# Patient Record
Sex: Female | Born: 1935 | Race: White | Hispanic: No | Smoking: Never smoker
Health system: Southern US, Community
[De-identification: ages and names within clinical notes are randomized; demographics above are authoritative.]

## PROBLEM LIST (undated history)

## (undated) DIAGNOSIS — E1140 Type 2 diabetes mellitus with diabetic neuropathy, unspecified: Secondary | ICD-10-CM

## (undated) DIAGNOSIS — K589 Irritable bowel syndrome without diarrhea: Secondary | ICD-10-CM

## (undated) DIAGNOSIS — E785 Hyperlipidemia, unspecified: Secondary | ICD-10-CM

## (undated) DIAGNOSIS — G4700 Insomnia, unspecified: Secondary | ICD-10-CM

## (undated) DIAGNOSIS — N183 Chronic kidney disease, stage 3 unspecified: Secondary | ICD-10-CM

## (undated) DIAGNOSIS — I480 Paroxysmal atrial fibrillation: Secondary | ICD-10-CM

## (undated) DIAGNOSIS — F419 Anxiety disorder, unspecified: Secondary | ICD-10-CM

## (undated) DIAGNOSIS — R42 Dizziness and giddiness: Secondary | ICD-10-CM

## (undated) DIAGNOSIS — T4145XA Adverse effect of unspecified anesthetic, initial encounter: Secondary | ICD-10-CM

## (undated) DIAGNOSIS — E1142 Type 2 diabetes mellitus with diabetic polyneuropathy: Secondary | ICD-10-CM

## (undated) DIAGNOSIS — Z794 Long term (current) use of insulin: Secondary | ICD-10-CM

## (undated) DIAGNOSIS — G629 Polyneuropathy, unspecified: Secondary | ICD-10-CM

## (undated) DIAGNOSIS — R0789 Other chest pain: Secondary | ICD-10-CM

## (undated) DIAGNOSIS — Z992 Dependence on renal dialysis: Secondary | ICD-10-CM

## (undated) DIAGNOSIS — J189 Pneumonia, unspecified organism: Secondary | ICD-10-CM

## (undated) DIAGNOSIS — R001 Bradycardia, unspecified: Secondary | ICD-10-CM

## (undated) DIAGNOSIS — Z9981 Dependence on supplemental oxygen: Secondary | ICD-10-CM

## (undated) DIAGNOSIS — R945 Abnormal results of liver function studies: Secondary | ICD-10-CM

## (undated) DIAGNOSIS — M1990 Unspecified osteoarthritis, unspecified site: Secondary | ICD-10-CM

## (undated) DIAGNOSIS — M109 Gout, unspecified: Secondary | ICD-10-CM

## (undated) DIAGNOSIS — R609 Edema, unspecified: Secondary | ICD-10-CM

## (undated) DIAGNOSIS — I700 Atherosclerosis of aorta: Secondary | ICD-10-CM

## (undated) DIAGNOSIS — H269 Unspecified cataract: Secondary | ICD-10-CM

## (undated) DIAGNOSIS — I509 Heart failure, unspecified: Secondary | ICD-10-CM

## (undated) DIAGNOSIS — I953 Hypotension of hemodialysis: Secondary | ICD-10-CM

## (undated) DIAGNOSIS — K2270 Barrett's esophagus without dysplasia: Secondary | ICD-10-CM

## (undated) DIAGNOSIS — K2990 Gastroduodenitis, unspecified, without bleeding: Secondary | ICD-10-CM

## (undated) DIAGNOSIS — C50919 Malignant neoplasm of unspecified site of unspecified female breast: Secondary | ICD-10-CM

## (undated) DIAGNOSIS — I4891 Unspecified atrial fibrillation: Secondary | ICD-10-CM

## (undated) DIAGNOSIS — Z9889 Other specified postprocedural states: Secondary | ICD-10-CM

## (undated) DIAGNOSIS — R109 Unspecified abdominal pain: Secondary | ICD-10-CM

## (undated) DIAGNOSIS — K219 Gastro-esophageal reflux disease without esophagitis: Secondary | ICD-10-CM

## (undated) DIAGNOSIS — R112 Nausea with vomiting, unspecified: Secondary | ICD-10-CM

## (undated) DIAGNOSIS — I739 Peripheral vascular disease, unspecified: Secondary | ICD-10-CM

## (undated) DIAGNOSIS — R7989 Other specified abnormal findings of blood chemistry: Secondary | ICD-10-CM

## (undated) DIAGNOSIS — N186 End stage renal disease: Secondary | ICD-10-CM

## (undated) DIAGNOSIS — K2970 Gastritis, unspecified, without bleeding: Secondary | ICD-10-CM

## (undated) DIAGNOSIS — B001 Herpesviral vesicular dermatitis: Secondary | ICD-10-CM

## (undated) DIAGNOSIS — N281 Cyst of kidney, acquired: Secondary | ICD-10-CM

## (undated) DIAGNOSIS — I10 Essential (primary) hypertension: Secondary | ICD-10-CM

## (undated) DIAGNOSIS — D638 Anemia in other chronic diseases classified elsewhere: Secondary | ICD-10-CM

## (undated) DIAGNOSIS — N289 Disorder of kidney and ureter, unspecified: Secondary | ICD-10-CM

## (undated) DIAGNOSIS — H8110 Benign paroxysmal vertigo, unspecified ear: Secondary | ICD-10-CM

## (undated) DIAGNOSIS — D509 Iron deficiency anemia, unspecified: Secondary | ICD-10-CM

## (undated) DIAGNOSIS — I150 Renovascular hypertension: Secondary | ICD-10-CM

## (undated) DIAGNOSIS — I499 Cardiac arrhythmia, unspecified: Secondary | ICD-10-CM

## (undated) DIAGNOSIS — Z79899 Other long term (current) drug therapy: Secondary | ICD-10-CM

## (undated) DIAGNOSIS — Z8489 Family history of other specified conditions: Secondary | ICD-10-CM

## (undated) DIAGNOSIS — T8859XA Other complications of anesthesia, initial encounter: Secondary | ICD-10-CM

## (undated) HISTORY — DX: Polyneuropathy, unspecified: G62.9

## (undated) HISTORY — DX: Other chest pain: R07.89

## (undated) HISTORY — DX: Gastroduodenitis, unspecified, without bleeding: K29.90

## (undated) HISTORY — PX: BACK SURGERY: SHX140

## (undated) HISTORY — DX: Gout, unspecified: M10.9

## (undated) HISTORY — PX: HEMORRHOID SURGERY: SHX153

## (undated) HISTORY — DX: Gastritis, unspecified, without bleeding: K29.70

## (undated) HISTORY — DX: Chronic kidney disease, stage 3 (moderate): N18.3

## (undated) HISTORY — DX: Type 2 diabetes mellitus with diabetic neuropathy, unspecified: E11.40

## (undated) HISTORY — DX: Gastro-esophageal reflux disease without esophagitis: K21.9

## (undated) HISTORY — DX: Unspecified atrial fibrillation: I48.91

## (undated) HISTORY — DX: Benign paroxysmal vertigo, unspecified ear: H81.10

## (undated) HISTORY — DX: Peripheral vascular disease, unspecified: I73.9

## (undated) HISTORY — DX: Chronic kidney disease, stage 3 unspecified: N18.30

## (undated) HISTORY — DX: Dizziness and giddiness: R42

## (undated) HISTORY — DX: Anxiety disorder, unspecified: F41.9

## (undated) HISTORY — DX: Morbid (severe) obesity due to excess calories: E66.01

## (undated) HISTORY — PX: MASTECTOMY: SHX3

## (undated) HISTORY — DX: Hyperlipidemia, unspecified: E78.5

## (undated) HISTORY — DX: Paroxysmal atrial fibrillation: I48.0

## (undated) HISTORY — DX: Anemia in other chronic diseases classified elsewhere: D63.8

## (undated) HISTORY — DX: Barrett's esophagus without dysplasia: K22.70

## (undated) HISTORY — DX: Edema, unspecified: R60.9

## (undated) HISTORY — PX: EYE SURGERY: SHX253

## (undated) HISTORY — DX: Long term (current) use of insulin: Z79.4

## (undated) HISTORY — DX: Renovascular hypertension: I15.0

## (undated) HISTORY — DX: Essential (primary) hypertension: I10

## (undated) HISTORY — PX: CHOLECYSTECTOMY: SHX55

## (undated) HISTORY — DX: Cyst of kidney, acquired: N28.1

## (undated) HISTORY — DX: Disorder of kidney and ureter, unspecified: N28.9

## (undated) HISTORY — DX: Irritable bowel syndrome, unspecified: K58.9

## (undated) HISTORY — DX: Insomnia, unspecified: G47.00

## (undated) HISTORY — DX: Atherosclerosis of aorta: I70.0

## (undated) HISTORY — DX: Unspecified cataract: H26.9

## (undated) HISTORY — PX: CATARACT EXTRACTION: SUR2

## (undated) HISTORY — DX: Hypotension of hemodialysis: I95.3

## (undated) HISTORY — DX: Herpesviral vesicular dermatitis: B00.1

## (undated) HISTORY — DX: Unspecified osteoarthritis, unspecified site: M19.90

## (undated) HISTORY — DX: Heart failure, unspecified: I50.9

---

## 2000-04-07 ENCOUNTER — Encounter: Payer: Self-pay | Admitting: Family Medicine

## 2000-04-07 ENCOUNTER — Encounter: Admission: RE | Admit: 2000-04-07 | Discharge: 2000-04-07 | Payer: Self-pay | Admitting: Family Medicine

## 2001-12-02 ENCOUNTER — Encounter: Payer: Self-pay | Admitting: Specialist

## 2001-12-02 ENCOUNTER — Emergency Department (HOSPITAL_COMMUNITY): Admission: EM | Admit: 2001-12-02 | Discharge: 2001-12-02 | Payer: Self-pay | Admitting: Emergency Medicine

## 2001-12-02 ENCOUNTER — Encounter: Payer: Self-pay | Admitting: Emergency Medicine

## 2001-12-07 ENCOUNTER — Encounter: Payer: Self-pay | Admitting: Orthopedic Surgery

## 2001-12-08 ENCOUNTER — Inpatient Hospital Stay (HOSPITAL_COMMUNITY): Admission: RE | Admit: 2001-12-08 | Discharge: 2001-12-09 | Payer: Self-pay | Admitting: Orthopedic Surgery

## 2004-02-03 ENCOUNTER — Inpatient Hospital Stay (HOSPITAL_COMMUNITY): Admission: EM | Admit: 2004-02-03 | Discharge: 2004-02-10 | Payer: Self-pay

## 2004-04-17 ENCOUNTER — Other Ambulatory Visit: Admission: RE | Admit: 2004-04-17 | Discharge: 2004-04-17 | Payer: Self-pay | Admitting: Oncology

## 2004-05-30 ENCOUNTER — Ambulatory Visit: Admission: RE | Admit: 2004-05-30 | Discharge: 2004-05-30 | Payer: Self-pay | Admitting: Family Medicine

## 2004-05-30 ENCOUNTER — Other Ambulatory Visit: Admission: RE | Admit: 2004-05-30 | Discharge: 2004-05-30 | Payer: Self-pay | Admitting: Oncology

## 2005-04-11 ENCOUNTER — Ambulatory Visit: Payer: Self-pay | Admitting: Oncology

## 2005-05-22 ENCOUNTER — Ambulatory Visit: Payer: Self-pay | Admitting: Internal Medicine

## 2005-06-02 ENCOUNTER — Ambulatory Visit: Payer: Self-pay | Admitting: Internal Medicine

## 2005-06-02 ENCOUNTER — Ambulatory Visit (HOSPITAL_COMMUNITY): Admission: RE | Admit: 2005-06-02 | Discharge: 2005-06-02 | Payer: Self-pay | Admitting: Internal Medicine

## 2005-10-24 ENCOUNTER — Ambulatory Visit: Payer: Self-pay | Admitting: Oncology

## 2005-10-30 DIAGNOSIS — N289 Disorder of kidney and ureter, unspecified: Secondary | ICD-10-CM

## 2005-10-30 DIAGNOSIS — I739 Peripheral vascular disease, unspecified: Secondary | ICD-10-CM

## 2005-10-30 HISTORY — DX: Disorder of kidney and ureter, unspecified: N28.9

## 2005-10-30 HISTORY — DX: Peripheral vascular disease, unspecified: I73.9

## 2005-12-22 HISTORY — PX: ORIF TIBIA & FIBULA FRACTURES: SHX2131

## 2006-01-08 ENCOUNTER — Ambulatory Visit: Payer: Self-pay | Admitting: Oncology

## 2006-01-26 ENCOUNTER — Other Ambulatory Visit: Admission: RE | Admit: 2006-01-26 | Discharge: 2006-01-26 | Payer: Self-pay | Admitting: Obstetrics and Gynecology

## 2006-03-31 ENCOUNTER — Encounter: Admission: RE | Admit: 2006-03-31 | Discharge: 2006-03-31 | Payer: Self-pay | Admitting: Specialist

## 2006-06-28 ENCOUNTER — Inpatient Hospital Stay (HOSPITAL_COMMUNITY): Admission: EM | Admit: 2006-06-28 | Discharge: 2006-07-02 | Payer: Self-pay | Admitting: Emergency Medicine

## 2006-07-17 ENCOUNTER — Ambulatory Visit: Payer: Self-pay | Admitting: Oncology

## 2006-07-17 LAB — CBC WITH DIFFERENTIAL/PLATELET
Basophils Absolute: 0 10*3/uL (ref 0.0–0.1)
Eosinophils Absolute: 0.1 10*3/uL (ref 0.0–0.5)
HCT: 29.8 % — ABNORMAL LOW (ref 34.8–46.6)
HGB: 9.9 g/dL — ABNORMAL LOW (ref 11.6–15.9)
LYMPH%: 17.4 % (ref 14.0–48.0)
MCV: 95.2 fL (ref 81.0–101.0)
MONO#: 1.1 10*3/uL — ABNORMAL HIGH (ref 0.1–0.9)
MONO%: 7.3 % (ref 0.0–13.0)
NEUT#: 10.8 10*3/uL — ABNORMAL HIGH (ref 1.5–6.5)
Platelets: 448 10*3/uL — ABNORMAL HIGH (ref 145–400)
WBC: 14.6 10*3/uL — ABNORMAL HIGH (ref 3.9–10.0)

## 2006-07-17 LAB — MORPHOLOGY

## 2006-07-17 LAB — CHCC SMEAR

## 2006-07-20 LAB — ERYTHROPOIETIN: Erythropoietin: 7.5 m[IU]/mL (ref 2.6–34.0)

## 2006-07-20 LAB — COMPREHENSIVE METABOLIC PANEL
ALT: 54 U/L — ABNORMAL HIGH (ref 0–40)
CO2: 22 mEq/L (ref 19–32)
Calcium: 9.2 mg/dL (ref 8.4–10.5)
Chloride: 108 mEq/L (ref 96–112)
Potassium: 4.9 mEq/L (ref 3.5–5.3)
Sodium: 141 mEq/L (ref 135–145)
Total Protein: 6.6 g/dL (ref 6.0–8.3)

## 2006-08-07 LAB — CBC WITH DIFFERENTIAL/PLATELET
BASO%: 1.3 % (ref 0.0–2.0)
Basophils Absolute: 0.1 10*3/uL (ref 0.0–0.1)
EOS%: 2.2 % (ref 0.0–7.0)
HGB: 12 g/dL (ref 11.6–15.9)
MCH: 31.1 pg (ref 26.0–34.0)
NEUT#: 2.6 10*3/uL (ref 1.5–6.5)
RBC: 3.84 10*6/uL (ref 3.70–5.32)
RDW: 15.7 % — ABNORMAL HIGH (ref 11.3–14.5)
lymph#: 2.3 10*3/uL (ref 0.9–3.3)

## 2006-08-17 ENCOUNTER — Encounter: Admission: RE | Admit: 2006-08-17 | Discharge: 2006-08-17 | Payer: Self-pay | Admitting: Specialist

## 2006-08-18 ENCOUNTER — Inpatient Hospital Stay (HOSPITAL_COMMUNITY): Admission: RE | Admit: 2006-08-18 | Discharge: 2006-08-27 | Payer: Self-pay | Admitting: Specialist

## 2006-08-31 ENCOUNTER — Ambulatory Visit: Payer: Self-pay | Admitting: Oncology

## 2006-08-31 LAB — CBC WITH DIFFERENTIAL/PLATELET
Basophils Absolute: 0 10*3/uL (ref 0.0–0.1)
Eosinophils Absolute: 0.2 10*3/uL (ref 0.0–0.5)
HGB: 10.3 g/dL — ABNORMAL LOW (ref 11.6–15.9)
MCV: 93.6 fL (ref 81.0–101.0)
MONO#: 0.7 10*3/uL (ref 0.1–0.9)
NEUT#: 4 10*3/uL (ref 1.5–6.5)
RDW: 15.1 % — ABNORMAL HIGH (ref 11.3–14.5)
WBC: 8 10*3/uL (ref 3.9–10.0)
lymph#: 3 10*3/uL (ref 0.9–3.3)

## 2006-10-14 ENCOUNTER — Ambulatory Visit: Payer: Self-pay | Admitting: Oncology

## 2006-12-10 ENCOUNTER — Ambulatory Visit: Payer: Self-pay | Admitting: Oncology

## 2006-12-17 LAB — MORPHOLOGY: PLT EST: ADEQUATE

## 2006-12-17 LAB — CBC WITH DIFFERENTIAL/PLATELET
BASO%: 0.5 % (ref 0.0–2.0)
HCT: 31.6 % — ABNORMAL LOW (ref 34.8–46.6)
MCHC: 33.6 g/dL (ref 32.0–36.0)
MONO#: 0.7 10*3/uL (ref 0.1–0.9)
RBC: 3.28 10*6/uL — ABNORMAL LOW (ref 3.70–5.32)
WBC: 10.7 10*3/uL — ABNORMAL HIGH (ref 3.9–10.0)
lymph#: 4 10*3/uL — ABNORMAL HIGH (ref 0.9–3.3)

## 2006-12-17 LAB — CHCC SMEAR

## 2006-12-18 LAB — IRON AND TIBC
%SAT: 28 % (ref 20–55)
TIBC: 394 ug/dL (ref 250–470)

## 2006-12-18 LAB — COMPREHENSIVE METABOLIC PANEL
ALT: 89 U/L — ABNORMAL HIGH (ref 0–35)
AST: 96 U/L — ABNORMAL HIGH (ref 0–37)
Creatinine, Ser: 2.53 mg/dL — ABNORMAL HIGH (ref 0.40–1.20)
Total Bilirubin: 0.6 mg/dL (ref 0.3–1.2)

## 2006-12-18 LAB — PROTEIN ELECTROPHORESIS, SERUM
Albumin ELP: 46.7 % — ABNORMAL LOW (ref 55.8–66.1)
Alpha-1-Globulin: 5 % — ABNORMAL HIGH (ref 2.9–4.9)
Gamma Globulin: 24.2 % — ABNORMAL HIGH (ref 11.1–18.8)

## 2006-12-18 LAB — FERRITIN: Ferritin: 289 ng/mL (ref 10–291)

## 2007-01-06 LAB — CBC WITH DIFFERENTIAL/PLATELET
Basophils Absolute: 0 10*3/uL (ref 0.0–0.1)
Eosinophils Absolute: 0.1 10*3/uL (ref 0.0–0.5)
HGB: 12.7 g/dL (ref 11.6–15.9)
LYMPH%: 36.5 % (ref 14.0–48.0)
MCV: 101.5 fL — ABNORMAL HIGH (ref 81.0–101.0)
MONO#: 0.5 10*3/uL (ref 0.1–0.9)
MONO%: 7.3 % (ref 0.0–13.0)
NEUT#: 3.8 10*3/uL (ref 1.5–6.5)
Platelets: 227 10*3/uL (ref 145–400)
RDW: 18.3 % — ABNORMAL HIGH (ref 11.3–14.5)

## 2007-01-25 ENCOUNTER — Ambulatory Visit: Payer: Self-pay | Admitting: Oncology

## 2007-01-28 LAB — CBC WITH DIFFERENTIAL/PLATELET
Eosinophils Absolute: 0.1 10*3/uL (ref 0.0–0.5)
HCT: 37.8 % (ref 34.8–46.6)
LYMPH%: 27 % (ref 14.0–48.0)
MCV: 98.4 fL (ref 81.0–101.0)
MONO%: 9.2 % (ref 0.0–13.0)
NEUT#: 5.3 10*3/uL (ref 1.5–6.5)
NEUT%: 62.3 % (ref 39.6–76.8)
Platelets: 204 10*3/uL (ref 145–400)
RBC: 3.85 10*6/uL (ref 3.70–5.32)

## 2007-02-18 LAB — CBC WITH DIFFERENTIAL/PLATELET
BASO%: 0.6 % (ref 0.0–2.0)
EOS%: 1.8 % (ref 0.0–7.0)
HCT: 37.6 % (ref 34.8–46.6)
LYMPH%: 31.8 % (ref 14.0–48.0)
MCH: 32.8 pg (ref 26.0–34.0)
MCHC: 33.7 g/dL (ref 32.0–36.0)
MCV: 97.2 fL (ref 81.0–101.0)
MONO#: 0.6 10*3/uL (ref 0.1–0.9)
MONO%: 8.2 % (ref 0.0–13.0)
NEUT%: 57.6 % (ref 39.6–76.8)
Platelets: 178 10*3/uL (ref 145–400)
RBC: 3.87 10*6/uL (ref 3.70–5.32)
WBC: 7.6 10*3/uL (ref 3.9–10.0)

## 2007-03-09 ENCOUNTER — Ambulatory Visit: Payer: Self-pay | Admitting: Oncology

## 2007-03-11 LAB — CBC WITH DIFFERENTIAL/PLATELET
BASO%: 0.6 % (ref 0.0–2.0)
EOS%: 2 % (ref 0.0–7.0)
MCH: 33.6 pg (ref 26.0–34.0)
MCHC: 35 g/dL (ref 32.0–36.0)
MONO#: 0.7 10*3/uL (ref 0.1–0.9)
NEUT%: 53.6 % (ref 39.6–76.8)
RBC: 3.85 10*6/uL (ref 3.70–5.32)
RDW: 15.5 % — ABNORMAL HIGH (ref 11.3–14.5)
WBC: 8.8 10*3/uL (ref 3.9–10.0)
lymph#: 3.2 10*3/uL (ref 0.9–3.3)

## 2007-06-07 ENCOUNTER — Ambulatory Visit: Payer: Self-pay | Admitting: Oncology

## 2007-06-10 LAB — CBC WITH DIFFERENTIAL/PLATELET
Basophils Absolute: 0.1 10*3/uL (ref 0.0–0.1)
Eosinophils Absolute: 0.2 10*3/uL (ref 0.0–0.5)
HGB: 11.4 g/dL — ABNORMAL LOW (ref 11.6–15.9)
LYMPH%: 34.2 % (ref 14.0–48.0)
MCV: 100.7 fL (ref 81.0–101.0)
MONO%: 9.1 % (ref 0.0–13.0)
NEUT#: 5.3 10*3/uL (ref 1.5–6.5)
Platelets: 171 10*3/uL (ref 145–400)
RDW: 15.5 % — ABNORMAL HIGH (ref 11.3–14.5)

## 2007-06-10 LAB — MORPHOLOGY
PLT EST: ADEQUATE
Platelet Morphology: NORMAL

## 2007-06-10 LAB — COMPREHENSIVE METABOLIC PANEL
ALT: 68 U/L — ABNORMAL HIGH (ref 0–35)
AST: 107 U/L — ABNORMAL HIGH (ref 0–37)
Alkaline Phosphatase: 90 U/L (ref 39–117)
Calcium: 8.8 mg/dL (ref 8.4–10.5)
Chloride: 101 mEq/L (ref 96–112)
Creatinine, Ser: 2.3 mg/dL — ABNORMAL HIGH (ref 0.40–1.20)

## 2007-06-10 LAB — CHCC SMEAR

## 2007-07-14 LAB — CBC WITH DIFFERENTIAL/PLATELET
EOS%: 2.1 % (ref 0.0–7.0)
Eosinophils Absolute: 0.2 10*3/uL (ref 0.0–0.5)
LYMPH%: 44 % (ref 14.0–48.0)
MCH: 34.2 pg — ABNORMAL HIGH (ref 26.0–34.0)
MCV: 99.6 fL (ref 81.0–101.0)
MONO%: 7.8 % (ref 0.0–13.0)
NEUT#: 5.1 10*3/uL (ref 1.5–6.5)
Platelets: 179 10*3/uL (ref 145–400)
RBC: 3.67 10*6/uL — ABNORMAL LOW (ref 3.70–5.32)

## 2007-08-07 ENCOUNTER — Ambulatory Visit: Payer: Self-pay | Admitting: Oncology

## 2007-08-11 LAB — CBC WITH DIFFERENTIAL/PLATELET
BASO%: 0.4 % (ref 0.0–2.0)
LYMPH%: 36.8 % (ref 14.0–48.0)
MCHC: 34.7 g/dL (ref 32.0–36.0)
MCV: 98.3 fL (ref 81.0–101.0)
MONO#: 0.8 10*3/uL (ref 0.1–0.9)
MONO%: 9 % (ref 0.0–13.0)
Platelets: 198 10*3/uL (ref 145–400)
RBC: 3.69 10*6/uL — ABNORMAL LOW (ref 3.70–5.32)
WBC: 9.2 10*3/uL (ref 3.9–10.0)

## 2007-09-08 LAB — CBC WITH DIFFERENTIAL/PLATELET
BASO%: 0.5 % (ref 0.0–2.0)
MCHC: 34.6 g/dL (ref 32.0–36.0)
MONO#: 0.8 10*3/uL (ref 0.1–0.9)
RBC: 3.69 10*6/uL — ABNORMAL LOW (ref 3.70–5.32)
WBC: 9.4 10*3/uL (ref 3.9–10.0)
lymph#: 3.4 10*3/uL — ABNORMAL HIGH (ref 0.9–3.3)

## 2007-10-04 ENCOUNTER — Ambulatory Visit: Payer: Self-pay | Admitting: Oncology

## 2007-10-06 LAB — CBC WITH DIFFERENTIAL/PLATELET
BASO%: 0.5 % (ref 0.0–2.0)
Basophils Absolute: 0.1 10*3/uL (ref 0.0–0.1)
HCT: 34.6 % — ABNORMAL LOW (ref 34.8–46.6)
HGB: 12 g/dL (ref 11.6–15.9)
MONO#: 0.7 10*3/uL (ref 0.1–0.9)
NEUT%: 54.4 % (ref 39.6–76.8)
WBC: 11.1 10*3/uL — ABNORMAL HIGH (ref 3.9–10.0)
lymph#: 4.1 10*3/uL — ABNORMAL HIGH (ref 0.9–3.3)

## 2007-11-03 LAB — CBC WITH DIFFERENTIAL/PLATELET
Basophils Absolute: 0 10*3/uL (ref 0.0–0.1)
EOS%: 1.4 % (ref 0.0–7.0)
Eosinophils Absolute: 0.2 10*3/uL (ref 0.0–0.5)
HCT: 41.2 % (ref 34.8–46.6)
HGB: 14.1 g/dL (ref 11.6–15.9)
MCH: 33.4 pg (ref 26.0–34.0)
MCV: 97.8 fL (ref 81.0–101.0)
NEUT%: 50.2 % (ref 39.6–76.8)
lymph#: 4.8 10*3/uL — ABNORMAL HIGH (ref 0.9–3.3)

## 2007-11-29 ENCOUNTER — Ambulatory Visit: Payer: Self-pay | Admitting: Oncology

## 2007-11-30 LAB — CBC WITH DIFFERENTIAL/PLATELET
Eosinophils Absolute: 0.3 10*3/uL (ref 0.0–0.5)
HCT: 38.9 % (ref 34.8–46.6)
LYMPH%: 30.9 % (ref 14.0–48.0)
MCHC: 34.4 g/dL (ref 32.0–36.0)
MCV: 95.9 fL (ref 81.0–101.0)
MONO#: 0.8 10*3/uL (ref 0.1–0.9)
MONO%: 6.6 % (ref 0.0–13.0)
NEUT#: 7.4 10*3/uL — ABNORMAL HIGH (ref 1.5–6.5)
NEUT%: 59.4 % (ref 39.6–76.8)
Platelets: 196 10*3/uL (ref 145–400)
RBC: 4.06 10*6/uL (ref 3.70–5.32)
WBC: 12.5 10*3/uL — ABNORMAL HIGH (ref 3.9–10.0)

## 2007-11-30 LAB — MORPHOLOGY

## 2007-12-02 LAB — COMPREHENSIVE METABOLIC PANEL
ALT: 78 U/L — ABNORMAL HIGH (ref 0–35)
AST: 104 U/L — ABNORMAL HIGH (ref 0–37)
Albumin: 3.5 g/dL (ref 3.5–5.2)
Alkaline Phosphatase: 97 U/L (ref 39–117)
Calcium: 8.9 mg/dL (ref 8.4–10.5)
Chloride: 106 mEq/L (ref 96–112)
Potassium: 4.1 mEq/L (ref 3.5–5.3)
Sodium: 141 mEq/L (ref 135–145)
Total Protein: 8 g/dL (ref 6.0–8.3)

## 2007-12-02 LAB — IRON AND TIBC
%SAT: 32 % (ref 20–55)
TIBC: 370 ug/dL (ref 250–470)

## 2007-12-02 LAB — KAPPA/LAMBDA LIGHT CHAINS: Kappa:Lambda Ratio: 0.88 (ref 0.26–1.65)

## 2008-02-08 ENCOUNTER — Ambulatory Visit: Payer: Self-pay | Admitting: Oncology

## 2008-02-10 LAB — CBC & DIFF AND RETIC
BASO%: 0.6 % (ref 0.0–2.0)
EOS%: 1.6 % (ref 0.0–7.0)
HCT: 36.1 % (ref 34.8–46.6)
IRF: 0.36 — ABNORMAL HIGH (ref 0.130–0.330)
MCH: 33.3 pg (ref 26.0–34.0)
MCHC: 34.4 g/dL (ref 32.0–36.0)
NEUT%: 55.4 % (ref 39.6–76.8)
RBC: 3.74 10*6/uL (ref 3.70–5.32)
Retic %: 2 % (ref 0.4–2.3)
WBC: 12.4 10*3/uL — ABNORMAL HIGH (ref 3.9–10.0)
lymph#: 4.2 10*3/uL — ABNORMAL HIGH (ref 0.9–3.3)

## 2008-02-10 LAB — CHCC SMEAR

## 2008-02-10 LAB — MORPHOLOGY: PLT EST: ADEQUATE

## 2008-05-05 ENCOUNTER — Ambulatory Visit: Payer: Self-pay | Admitting: Oncology

## 2008-09-04 ENCOUNTER — Ambulatory Visit: Payer: Self-pay | Admitting: Oncology

## 2008-09-06 LAB — COMPREHENSIVE METABOLIC PANEL
ALT: 22 U/L (ref 0–35)
AST: 27 U/L (ref 0–37)
Albumin: 3.7 g/dL (ref 3.5–5.2)
Alkaline Phosphatase: 108 U/L (ref 39–117)
Potassium: 3.6 mEq/L (ref 3.5–5.3)
Sodium: 138 mEq/L (ref 135–145)
Total Bilirubin: 0.4 mg/dL (ref 0.3–1.2)
Total Protein: 7.3 g/dL (ref 6.0–8.3)

## 2008-09-06 LAB — CBC & DIFF AND RETIC
Eosinophils Absolute: 0.2 10*3/uL (ref 0.0–0.5)
HCT: 38.3 % (ref 34.8–46.6)
LYMPH%: 30.6 % (ref 14.0–48.0)
MCHC: 33.9 g/dL (ref 32.0–36.0)
MCV: 95 fL (ref 81.0–101.0)
MONO#: 0.7 10*3/uL (ref 0.1–0.9)
MONO%: 5.2 % (ref 0.0–13.0)
NEUT#: 8.6 10*3/uL — ABNORMAL HIGH (ref 1.5–6.5)
NEUT%: 62.6 % (ref 39.6–76.8)
Platelets: 188 10*3/uL (ref 145–400)
RBC: 4.03 10*6/uL (ref 3.70–5.32)
Retic %: 2.1 % (ref 0.4–2.3)
WBC: 13.8 10*3/uL — ABNORMAL HIGH (ref 3.9–10.0)

## 2008-09-06 LAB — MORPHOLOGY
PLT EST: ADEQUATE
RBC Comments: NORMAL

## 2008-09-06 LAB — IRON AND TIBC
%SAT: 22 % (ref 20–55)
Iron: 78 ug/dL (ref 42–145)

## 2008-09-06 LAB — CHCC SMEAR

## 2008-12-05 DIAGNOSIS — R0789 Other chest pain: Secondary | ICD-10-CM

## 2008-12-05 HISTORY — DX: Other chest pain: R07.89

## 2011-01-11 ENCOUNTER — Encounter: Payer: Self-pay | Admitting: Family Medicine

## 2011-01-12 ENCOUNTER — Encounter: Payer: Self-pay | Admitting: Family Medicine

## 2011-05-09 NOTE — Discharge Summary (Signed)
[name] James, [name] James               ACCOUNT NO.:  [id number]   MEDICAL RECORD NO.:  [id number]          PATIENT TYPE:  INP   LOCATION:  2019                         FACILITY:  [facility]   PHYSICIAN:  Jessy Oto, M.D.   DATE OF BIRTH:  1936/12/13   DATE OF ADMISSION:  08/18/2006  DATE OF DISCHARGE:                                 DISCHARGE SUMMARY   ADMISSION DIAGNOSES:  1. Left six week old depressed medial tibial plateau fracture with entry      into the lateral compartment.  2. Status post nondisplaced left lateral malleolus fracture treated with      casting.  3. Chronic renal insufficiency.  4. Type 2 diabetes mellitus.  5. Hypertension.  6. Hypercholesterolemia.  7. History of anemia.   DISCHARGE DIAGNOSES:  1. Left six week old depressed medial tibial plateau fracture with entry      into the lateral compartment.  2. Status post nondisplaced left lateral malleolus fracture treated with      casting.  3. Chronic renal insufficiency.  4. Type 2 diabetes mellitus.  5. Hypertension.  6. Hypercholesterolemia.  7. History of anemia.  8. Hypokalemia, resolved at discharge.  9. Gastroesophageal reflux disease treated with proton pump inhibitor in      the hospital.  10.Acute on chronic renal insufficiency, resolved at discharge.  11.Transient atrial fibrillation, resolved at discharge.   PROCEDURES:  On August 18, 2006, the patient underwent open reduction  internal fixation of left medial tibial plateau fracture utilizing buttress  plate and screws as well as bone grafting utilizing Allograft cancellous  chip and tricortical Allograft bone graft.  This was performed by Dr. Louanne Skye,  assisted by Phillips Hay, P.A., under general anesthesia.   CONSULTATIONS:  [employer].   BRIEF HISTORY:  [name] James is a 75 year old female with history of type 2  diabetes mellitus controlled with oral agents and diet.  She also has renal  insufficiency and hypertension.  On June 28, 2006, she fell while getting in  her car in a parking lot sustaining a fracture to the right ankle.  This was  treated in the emergency room where x-rays revealed nondisplaced malleolar  malleolus fracture.  She was splinted and eventually was sent to nursing  facility for further care as was unable to care for herself at home.  Per  follow up in the office, she complained of left knee pain.  The radiographs  were obtained.  The x-rays of the left knee showed a bicondylar plateau  fracture with fracture of the medial plateau with depression increasing  varus deformity.  The depression was greater than 4 mm with entry into the  medial aspect of the lateral tibial plateau fracture found on CT scan.  It  was felt she would require surgical intervention and was admitted for the  procedure as stated above.   BRIEF HOSPITAL COURSE:  On the first postoperative day, the patient had  notable low urine output.  Her renal insufficiency did appear to be  worsening and a medical consult was obtained.  The [facility] was  able to see the patient for medical care during the hospital stay.  She  initially was placed on vancomycin postoperatively.  However, this was  discontinued due to her poor renal function.  Her diabetes was managed with  sliding scale insulin.  She was noted to be hypotensive on the first  postoperative day as well, and this was treated with fluid support.  The  patient was placed on Coumadin for DVT prophylaxis.  Adjustments in Coumadin  dose were made according to daily pro times.  Hyperkalemia was noted and  felt to be secondary to renal function.  Medications were adjusted, and the  patient's potassium returned to normal level.  The patient complained of  difficulty with swallowing and reflux-type symptoms.  Protonix was started  and symptoms resolved slowly during the hospital stay.  From an orthopedic  standpoint, physical therapy was initiated on postop day #1.  She was  given  instructions for strict nonweightbearing on the operative extremity due to  her obesity as well as her debilitation.  Secondary to the injury, she had  difficulty tolerating very much activity.  Essentially, physical therapy  involved bed-to-chair transfers.  She was instructed in range of motion and  strengthening exercises of the left knee wearing a Bledsoe brace.  The  Bledsoe brace was applied by [employer].  Dressing changes were performed at  the wound site which was found to be healing well throughout the hospital  stay.  The patient required social service consult to assist with nursing  home placement.  She preoperatively was residing at [facility] in [place].  A bed was reserved for her.  It was felt that she  would require return to this facility for continuation of her rehabilitation  while awaiting medical clearance to return to the nursing facility, she did  develop transient atrial fibrillation.  This was treated with Cardizem.  She  was also noted to have an elevated D. dimer.  A VQ scan was obtained, and  the patient was noted to be negative for pulmonary embolism.  It was not  felt that she would require long-term anticoagulation for her atrial  fibrillation.  Eventually, her renal insufficiency resolved and her diabetes  and hypertension were deemed stable.  However, she did continue to be very  slow with any type of activities.  She was able to scoot-pivot-transfer to  the bedside chair with moderate assist being nonweightbearing on the left  lower extremity.  She did require resting during transfer secondary to  fatigue.  Hemoglobin and hematocrit were normal on admission with values  12.4 and 37.0, respectively.  On August 20, 2006, hemoglobin and hematocrit  were stable at 9.6 and 28.6.  Coagulation studies on August 25, 2006,  showed INR 2.6.  Chemistry studies on admission were BUN 34, creatinine 1.3. Values elevated to BUN 42 and  creatinine 2.0 have been resolved to normal  values of 19 and 1.2 prior to discharge.  Cardiac markers were obtained on  August 23, 2006, after an episode of atrial fibrillation.  The values were  all within normal limits.  TSH was noted to be normal on August 23, 2006,  as well.  BNP on August 29 was 54.  VQ lung scan showed no evidence of  pulmonary embolism.  Chest x-ray on August 28 showed no acute  cardiopulmonary findings and repeat on August 29 showed left base  atelectasis.   PLAN:  The patient is being transferred back to  the facility from which she  came.  She was residing at [facility].  There she  should continue with physical therapy for ambulation and gait training, bed-  to-chair transfers.  She is strict nonweightbearing on the left lower  extremity.  She is quite debilitated and does have some difficulty with  transfers at this time.  The patient has a Bledsoe brace to the left knee  which will be at 90 degrees maximum of flexion.  Range of motion and  strengthening exercises should be performed with the Bledsoe brace applied.  She has been wearing TED stockings during the hospital stay to prevent  edema.  She should continue to wear TED's; however, they should be removed  at least every 8 hours to do skin checks.  The patient should be monitored  closely for skin breakdown as she is quite immobile at this point.  Her  heels and sacrum and elbows should be checked regularly.  This would mean at  least on a b.i.d. basis.  She will be on a low carbohydrate modified diet.  She will continue on sliding scale insulin for her diabetes as well.  Sliding scale should be written by the physician at the nursing facility.  Thus far, she has been only moderate NovoLog subcutaneous insulin protocol  with values ranging 60-100, 0 units; 101-150, 2 units; 151-200, 3 units; 201-  250, 5 units; 251-300, 7 units; 301-350, 9 units; greater than 350, 11   units.   MEDICATIONS:  1. Glucotrol 10 mg p.o. b.i.d.  2. Zocor 20 mg p.o. daily.  3. Ferrous sulfate 325 mg p.o. daily.  4. Multivitamin daily.  5. Tri-Chlor 140 mg tablet p.o. daily.  6. Actos 30 mg daily.  7. Valium 5 mg p.o. b.i.d.  8. Zyloprim 100 mg p.o. b.i.d.  9. Pepcid 20 mg p.o. daily.  10.MiraLax p.o. b.i.d.  11.Atenolol 25 mg p.o. b.i.d.  12.Magic Mouthwash as needed.  13.Avapro 150 mg p.o. daily.  14.P.r.n. medications including:      a.     Percocet 5/325 1-2 q.4-6 h p.r.n. pain.      b.     Restoril 30 mg one q.h.s. p.r.n. sleep.      c.     Robaxin 500 mg one q.8 h p.r.n. spasms.  15.She should remain on Coumadin for DVT prophylaxis.  She is currently on      2 mg daily at 6 p.m.  This should be continued until at least six weeks      postop.  Discontinuation of the medication will depend also upon her      activity level.   DISCHARGE INSTRUCTIONS:  Her dressings of the knee may be removed for showering purposes on a daily basis.  The clean dry dressing may be applied.  The patient should be encouraged in exercises of the lower and upper  extremities, even when physical therapy is not present.  Occupational  therapy consult for ADL's with recommendations named for her ADL's at the  nursing facility.  The patient should follow up with Dr. Louanne Skye two weeks  from the date of her surgery.  Appointment may be made by calling [phone].   CONDITION ON DISCHARGE:  Stable.      Epimenio Foot, P.A.      Jessy Oto, M.D.  Electronically Signed    SMV/MEDQ  D:  08/26/2006  T:  08/26/2006  Job:  [id number]

## 2011-05-09 NOTE — Op Note (Signed)
NAMECANDIACE, [name] James               ACCOUNT NO.:  [id number]   MEDICAL RECORD NO.:  [id number]          PATIENT TYPE:  INP   LOCATION:  5007                         FACILITY:  [facility]   PHYSICIAN:  Jessy Oto, M.D.   DATE OF BIRTH:  05/29/1936   DATE OF PROCEDURE:  08/18/2006  DATE OF DISCHARGE:                                 OPERATIVE REPORT   PREOPERATIVE DIAGNOSIS:  Left six-week-old depressed medial tibial plateau  fracture with entry into the lateral compartment.  Previous nondisplaced  left lateral malleolus fracture.   POSTOPERATIVE DIAGNOSIS:  Left six-week-old depressed medial tibial plateau  fracture with entry into the lateral compartment.  Previous nondisplaced  left lateral malleolus fracture.   PROCEDURE:  Open reduction internal fixation of a left medial tibial plateau  fracture, elevating the medial tibial plateau and bone grafting underneath,  and internal fixation using a 7- hole 3.5 locking buttress plate.  Bone  grafting with allograft cancellus chips as well as tricortical allograft  bone graft material.   SURGEON:  Jessy Oto, M.D.   ASSISTANT:  Phillips Hay, PA-C.   ANESTHESIA:  General via oral tracheal intubation, Dr. Sherren Kerns.   SPECIMENS:  None.   ESTIMATED BLOOD LOSS:  300 mL.   COMPLICATIONS:  None.   TOTAL TOURNIQUET TIME:  1 hour 50 minutes at 300 mmHg.   DRAINS:  Hemovac drain x1, medial left knee.   CONDITION:  The patient returned to the PACU in good condition.   HISTORY OF PRESENT ILLNESS:  The patient is a 75 year old female with a  history of diabetes mellitus type 2 controlled with oral agents and diet.  She has a history of renal insufficiency and hypertension.  She fell on June 28, 2006 when getting into her car in a parking lot.  She sustained an injury  to her left ankle at that time and presented emergency room.  X-rays  demonstrated nondisplaced lateral malleolus fracture.  She was placed into a  splint and admitted  for nursing home placement as she is quite large and  morbidly obese.  She is unable to maintain a nonweightbearing status.  She  has been seen in the office for her follow-up visit, and a short-leg cast  was applied at her last visit.  On the second visit to the office, she  complained of left knee discomfort.  Radiographs were taken, and these  demonstrate a left medial tibial plateau fracture.  This was truly a  bicondylar plateau fracture with fracture of the medial plateau with  depression and increasing varus deformity.  Depression greater than 4 mm  with entry into the medial aspect the lateral tibial plateau.  The patient  was brought to the operating room after a CT scan was done in order to  mobilize the fracture site, bone graft to elevate and buttress the medial  tibial plateau fracture to prevent further angulatory deformity.   INTRAOPERATIVE FINDINGS:  The patient was found to have significant callus  that had formed at the fracture site.  This required osteotomizing the  fracture site elevation as  well as manipulation of the knee, causing the  fracture then to be mobilized.  This was mobilized, and it could be reduced  using reducing tenaculums and then bone grafted after elevation with a 3/4-  inch curved osteotome, bone grafted and buttress plate.   DESCRIPTION OF PROCEDURE:  After adequate general anesthesia, the patient  had Foley catheter placed.  Left lower extremity prepped from the ankle to  the upper thigh with DuraPrep solution.  A tourniquet about the left upper  thigh, a bump under the left buttock.  Skids placed on both sides of the  [place] table as the patient was quite large to prevent her from rolling  off.  All pressure points well-padded.  Standard preoperative antibiotics of  vancomycin as she is allergic to penicillin.  The patient had draping of the  left lower extremity in the usual manner following prep with DuraPrep  solution.  The left leg was  elevated and exsanguinated with an Esmarch  bandage, tourniquet in the left leg inflated to 300 mmHg.  The patient had  incision over the anterior aspect of the left knee, extending from the  superior pole of the patella inferiorly along the medial aspect of the  anterior tibial tubercle at approximately 3 inches below the anterior tibial  tubercle.  Total incision length about 15 cm through skin and subcu layers  down to the external retinaculum of the knee which was incised in line with  the skin incision and then developed medially.  The retinaculum of the knee  was carefully incised over the left side and incision carried down to the  anterior proximal tibia and then along the medial portion of the crest of  the tibia.  This was then elevated medially along with the pes anserinus  tendons, elevated medially, and this was taken down all way to the posterior  corner of the medial aspect of the knee joint.  Using C-arm fluoro which was  draped sterilely and brought into the field, the fracture site was  identified with osteotome and a Creigo inserted into the fracture site to  help to manipulate to open this area.  Finally, a 3/4-inch curved osteotome  was able to be inserted into the fracture site along the posterior medial  aspect of the tibia and was inserted transversely and with the leg brought  into valgus manipulation as well as using the osteotome, and the fracture  was able to be mobilized, and elevation of medial plateau was able to be  carried out.  Tricortical allograft bone graft was then activated in saline  solution as was 20 mL of cancellous bone chips allograft material.  The 8-mm  graft was then placed beneath the medial joint surface and used to help  elevate structurally the medial tibial plateau, and this was impacted into  place.  This provided some degree of reduction; however, there still remained another 2 or 3 mm of reduction necessary.  This required further   valgus manipulation and then bone grafting with cancellus chips until the  joint line was felt to be reduced in stable varus-valgus.  The patient has  accentuation of valgus knee of about 8 degrees.  With bone grafting with  cancellous chips, note that the reduced tenaculum was left in place the  entire time in order to prevent there from being further loss of reduction  of the fracture with the impaction of chips and bone graft into the fracture  site.  At this point,  a 7/64th inch Steinmann pin was then passed over the  anterior lateral aspect of the left proximal tibia transversely just  subchondral to the medial tibial plateau to hold this area reduced.   A 3.5 locking tibial plateau buttress plate, 7-hole, was then carefully  fitted to the proximal aspect of the tibia, both the AP and lateral planes.  This was placed just beneath subchondral bone on the lateral view in order  to allow for buttressing of the fracture which was also in a very proximal  tibial location.  This plate was then held in place, and the distal two  screw holes were filled with 3.5 screws.  The oblique screw third from end  of the plate proximally was then also filled with a locking 3.5 screw about  70 mm in length.  An anterior screw was able to be placed into the buttress  plate, buttressing the medial plateau of the tibia over the anterior aspect.  This was able to be performed through the anterior incision; however, a stab  incision was necessary in order to place the two posterior locking screws  within the T buttress plate.  Small incision about 2 cm in length was made  and hemostat used to spread subcu layers down to the level of the proximal  portion of the T buttress plate, and then the guide for the drill bit was  then carefully passed and placed into each of the two remaining holes for  the T buttress plate.  Then, drill holes placed, depths measured and  appropriate size screws placed completing  fixation of the proximal portion  of the tibia plateau and the buttressing portion of the T buttress plate.  With this, permanent C-arm images were obtained in AP and lateral planes  demonstrating reduction of the fracture into a neutral position of varus and  valgus.  Bone grafting area obvious on radiographs.   Irrigation was then performed.  Tourniquet was released.  Total tourniquet  time was 1 hour and 15 minutes and included several periods of obtaining  instrumentation and flashing of instruments x2.  With this completed, then a  medium Hemovac drain was placed in the depths of the incision on the medial  aspect of the knee, exiting over the anterior medial aspect of the of the  knee.  The external retinaculum of the knee was approximated with  interrupted #1 Vicryl sutures.  The superficial fascial layer over the  proximal tibial area medially was approximated with interrupted #1 Vicryl sutures, subcu layers approximated with interrupted 0 and interrupted 2-0  Vicryl sutures and skin closed with stainless steel staples.  Medial stab  incision used for placement of the proximal buttress plate locking screws  was closed with subcu layers of 2-0 Vicryl and then skin closed with  stainless steel staples.  Stainless steel staples were used to close the  skin of the anterior incision.  Adaptic 4x4s, ABD pad were affixed to the  skin with sterile Webril.  An Ace wrap then applied from the left foot to  upper thigh.  The patient was then placed into a knee immobilizer with  removal of the posterior stays.  She was then reactivated, extubated, and  returned recovery room in satisfactory condition.  Also instrument and  sponge counts were correct.      Jessy Oto, M.D.  Electronically Signed     JEN/MEDQ  D:  08/18/2006  T:  08/19/2006  Job:  [id number]

## 2011-05-13 DIAGNOSIS — I509 Heart failure, unspecified: Secondary | ICD-10-CM

## 2011-05-13 HISTORY — DX: Heart failure, unspecified: I50.9

## 2011-07-10 ENCOUNTER — Emergency Department (HOSPITAL_COMMUNITY)
Admission: EM | Admit: 2011-07-10 | Discharge: 2011-07-10 | Disposition: A | Discharge status: Home / Self Care | Payer: Medicare Other | Attending: Emergency Medicine | Admitting: Emergency Medicine

## 2011-07-10 DIAGNOSIS — Z23 Encounter for immunization: Secondary | ICD-10-CM | POA: Insufficient documentation

## 2011-07-10 DIAGNOSIS — I10 Essential (primary) hypertension: Secondary | ICD-10-CM | POA: Insufficient documentation

## 2011-07-10 DIAGNOSIS — E119 Type 2 diabetes mellitus without complications: Secondary | ICD-10-CM | POA: Insufficient documentation

## 2011-07-10 DIAGNOSIS — W260XXA Contact with knife, initial encounter: Secondary | ICD-10-CM | POA: Insufficient documentation

## 2011-07-10 DIAGNOSIS — Z794 Long term (current) use of insulin: Secondary | ICD-10-CM | POA: Insufficient documentation

## 2011-07-10 DIAGNOSIS — W261XXA Contact with sword or dagger, initial encounter: Secondary | ICD-10-CM | POA: Insufficient documentation

## 2011-07-10 DIAGNOSIS — S61209A Unspecified open wound of unspecified finger without damage to nail, initial encounter: Secondary | ICD-10-CM | POA: Insufficient documentation

## 2011-07-10 DIAGNOSIS — E7800 Pure hypercholesterolemia, unspecified: Secondary | ICD-10-CM | POA: Insufficient documentation

## 2012-05-22 DIAGNOSIS — S93609A Unspecified sprain of unspecified foot, initial encounter: Secondary | ICD-10-CM | POA: Diagnosis not present

## 2012-05-22 DIAGNOSIS — S60229A Contusion of unspecified hand, initial encounter: Secondary | ICD-10-CM | POA: Diagnosis not present

## 2012-05-28 DIAGNOSIS — I10 Essential (primary) hypertension: Secondary | ICD-10-CM | POA: Diagnosis not present

## 2012-05-28 DIAGNOSIS — E1129 Type 2 diabetes mellitus with other diabetic kidney complication: Secondary | ICD-10-CM | POA: Diagnosis not present

## 2012-05-28 DIAGNOSIS — F411 Generalized anxiety disorder: Secondary | ICD-10-CM | POA: Diagnosis not present

## 2012-05-28 DIAGNOSIS — E785 Hyperlipidemia, unspecified: Secondary | ICD-10-CM | POA: Diagnosis not present

## 2012-05-28 DIAGNOSIS — E1149 Type 2 diabetes mellitus with other diabetic neurological complication: Secondary | ICD-10-CM | POA: Diagnosis not present

## 2012-05-28 DIAGNOSIS — E1142 Type 2 diabetes mellitus with diabetic polyneuropathy: Secondary | ICD-10-CM | POA: Diagnosis not present

## 2012-05-28 DIAGNOSIS — K219 Gastro-esophageal reflux disease without esophagitis: Secondary | ICD-10-CM | POA: Diagnosis not present

## 2012-06-07 DIAGNOSIS — R609 Edema, unspecified: Secondary | ICD-10-CM | POA: Diagnosis not present

## 2012-06-21 DIAGNOSIS — H25819 Combined forms of age-related cataract, unspecified eye: Secondary | ICD-10-CM | POA: Insufficient documentation

## 2012-06-21 DIAGNOSIS — H2589 Other age-related cataract: Secondary | ICD-10-CM | POA: Diagnosis not present

## 2012-06-21 DIAGNOSIS — H18519 Endothelial corneal dystrophy, unspecified eye: Secondary | ICD-10-CM | POA: Insufficient documentation

## 2012-07-19 DIAGNOSIS — I129 Hypertensive chronic kidney disease with stage 1 through stage 4 chronic kidney disease, or unspecified chronic kidney disease: Secondary | ICD-10-CM | POA: Insufficient documentation

## 2012-07-19 DIAGNOSIS — H2589 Other age-related cataract: Secondary | ICD-10-CM | POA: Diagnosis not present

## 2012-07-19 DIAGNOSIS — R9431 Abnormal electrocardiogram [ECG] [EKG]: Secondary | ICD-10-CM | POA: Diagnosis not present

## 2012-07-19 DIAGNOSIS — N189 Chronic kidney disease, unspecified: Secondary | ICD-10-CM | POA: Diagnosis not present

## 2012-07-19 DIAGNOSIS — Z01818 Encounter for other preprocedural examination: Secondary | ICD-10-CM | POA: Diagnosis not present

## 2012-07-27 DIAGNOSIS — F411 Generalized anxiety disorder: Secondary | ICD-10-CM | POA: Diagnosis not present

## 2012-07-27 DIAGNOSIS — E119 Type 2 diabetes mellitus without complications: Secondary | ICD-10-CM | POA: Diagnosis not present

## 2012-07-27 DIAGNOSIS — R609 Edema, unspecified: Secondary | ICD-10-CM | POA: Diagnosis not present

## 2012-07-27 DIAGNOSIS — H259 Unspecified age-related cataract: Secondary | ICD-10-CM | POA: Diagnosis not present

## 2012-07-27 DIAGNOSIS — K219 Gastro-esophageal reflux disease without esophagitis: Secondary | ICD-10-CM | POA: Diagnosis not present

## 2012-07-27 DIAGNOSIS — N189 Chronic kidney disease, unspecified: Secondary | ICD-10-CM | POA: Diagnosis not present

## 2012-07-27 DIAGNOSIS — H2510 Age-related nuclear cataract, unspecified eye: Secondary | ICD-10-CM | POA: Diagnosis not present

## 2012-07-28 DIAGNOSIS — H2589 Other age-related cataract: Secondary | ICD-10-CM | POA: Diagnosis not present

## 2012-07-28 DIAGNOSIS — Z947 Corneal transplant status: Secondary | ICD-10-CM | POA: Diagnosis not present

## 2012-07-28 DIAGNOSIS — H18519 Endothelial corneal dystrophy, unspecified eye: Secondary | ICD-10-CM | POA: Diagnosis not present

## 2012-07-28 DIAGNOSIS — Z961 Presence of intraocular lens: Secondary | ICD-10-CM | POA: Insufficient documentation

## 2012-07-28 DIAGNOSIS — Z9849 Cataract extraction status, unspecified eye: Secondary | ICD-10-CM | POA: Diagnosis not present

## 2012-08-04 DIAGNOSIS — Z9849 Cataract extraction status, unspecified eye: Secondary | ICD-10-CM | POA: Diagnosis not present

## 2012-08-04 DIAGNOSIS — Z961 Presence of intraocular lens: Secondary | ICD-10-CM | POA: Diagnosis not present

## 2012-09-06 DIAGNOSIS — Z9849 Cataract extraction status, unspecified eye: Secondary | ICD-10-CM | POA: Diagnosis not present

## 2012-09-06 DIAGNOSIS — Z947 Corneal transplant status: Secondary | ICD-10-CM | POA: Diagnosis not present

## 2012-09-06 DIAGNOSIS — Z961 Presence of intraocular lens: Secondary | ICD-10-CM | POA: Diagnosis not present

## 2012-09-06 DIAGNOSIS — Z48298 Encounter for aftercare following other organ transplant: Secondary | ICD-10-CM | POA: Diagnosis not present

## 2012-11-04 DIAGNOSIS — Z23 Encounter for immunization: Secondary | ICD-10-CM | POA: Diagnosis not present

## 2012-11-12 DIAGNOSIS — Z947 Corneal transplant status: Secondary | ICD-10-CM | POA: Diagnosis not present

## 2012-11-12 DIAGNOSIS — Z48298 Encounter for aftercare following other organ transplant: Secondary | ICD-10-CM | POA: Diagnosis not present

## 2012-11-12 DIAGNOSIS — Z961 Presence of intraocular lens: Secondary | ICD-10-CM | POA: Diagnosis not present

## 2012-11-12 DIAGNOSIS — H2589 Other age-related cataract: Secondary | ICD-10-CM | POA: Diagnosis not present

## 2012-11-12 DIAGNOSIS — Z9849 Cataract extraction status, unspecified eye: Secondary | ICD-10-CM | POA: Diagnosis not present

## 2012-11-12 DIAGNOSIS — H269 Unspecified cataract: Secondary | ICD-10-CM | POA: Diagnosis not present

## 2012-11-23 DIAGNOSIS — H40059 Ocular hypertension, unspecified eye: Secondary | ICD-10-CM | POA: Diagnosis not present

## 2012-11-23 DIAGNOSIS — H2510 Age-related nuclear cataract, unspecified eye: Secondary | ICD-10-CM | POA: Diagnosis not present

## 2013-01-11 DIAGNOSIS — I10 Essential (primary) hypertension: Secondary | ICD-10-CM | POA: Diagnosis not present

## 2013-01-11 DIAGNOSIS — E785 Hyperlipidemia, unspecified: Secondary | ICD-10-CM | POA: Diagnosis not present

## 2013-01-11 DIAGNOSIS — R197 Diarrhea, unspecified: Secondary | ICD-10-CM | POA: Diagnosis not present

## 2013-01-11 DIAGNOSIS — E1149 Type 2 diabetes mellitus with other diabetic neurological complication: Secondary | ICD-10-CM | POA: Diagnosis not present

## 2013-01-11 DIAGNOSIS — E1129 Type 2 diabetes mellitus with other diabetic kidney complication: Secondary | ICD-10-CM | POA: Diagnosis not present

## 2013-01-11 DIAGNOSIS — M109 Gout, unspecified: Secondary | ICD-10-CM | POA: Diagnosis not present

## 2013-01-11 DIAGNOSIS — M549 Dorsalgia, unspecified: Secondary | ICD-10-CM | POA: Diagnosis not present

## 2013-01-11 DIAGNOSIS — E1142 Type 2 diabetes mellitus with diabetic polyneuropathy: Secondary | ICD-10-CM | POA: Diagnosis not present

## 2013-02-01 ENCOUNTER — Encounter: Payer: Self-pay | Admitting: Pharmacist Clinician (PhC)/ Clinical Pharmacy Specialist

## 2013-03-16 DIAGNOSIS — H40009 Preglaucoma, unspecified, unspecified eye: Secondary | ICD-10-CM | POA: Diagnosis not present

## 2013-03-16 DIAGNOSIS — Z961 Presence of intraocular lens: Secondary | ICD-10-CM | POA: Diagnosis not present

## 2013-03-16 DIAGNOSIS — H2589 Other age-related cataract: Secondary | ICD-10-CM | POA: Diagnosis not present

## 2013-03-16 DIAGNOSIS — Z9849 Cataract extraction status, unspecified eye: Secondary | ICD-10-CM | POA: Diagnosis not present

## 2013-03-16 DIAGNOSIS — Z947 Corneal transplant status: Secondary | ICD-10-CM | POA: Diagnosis not present

## 2013-03-30 DIAGNOSIS — H4011X0 Primary open-angle glaucoma, stage unspecified: Secondary | ICD-10-CM | POA: Diagnosis not present

## 2013-04-04 DIAGNOSIS — H4011X0 Primary open-angle glaucoma, stage unspecified: Secondary | ICD-10-CM | POA: Diagnosis not present

## 2013-04-26 DIAGNOSIS — H4011X0 Primary open-angle glaucoma, stage unspecified: Secondary | ICD-10-CM | POA: Diagnosis not present

## 2013-05-24 DIAGNOSIS — H4011X0 Primary open-angle glaucoma, stage unspecified: Secondary | ICD-10-CM | POA: Diagnosis not present

## 2013-07-12 DIAGNOSIS — E1129 Type 2 diabetes mellitus with other diabetic kidney complication: Secondary | ICD-10-CM | POA: Diagnosis not present

## 2013-07-12 DIAGNOSIS — D631 Anemia in chronic kidney disease: Secondary | ICD-10-CM | POA: Diagnosis not present

## 2013-07-12 DIAGNOSIS — N039 Chronic nephritic syndrome with unspecified morphologic changes: Secondary | ICD-10-CM | POA: Diagnosis not present

## 2013-07-12 DIAGNOSIS — I10 Essential (primary) hypertension: Secondary | ICD-10-CM | POA: Diagnosis not present

## 2013-07-12 DIAGNOSIS — L989 Disorder of the skin and subcutaneous tissue, unspecified: Secondary | ICD-10-CM | POA: Diagnosis not present

## 2013-07-12 DIAGNOSIS — N184 Chronic kidney disease, stage 4 (severe): Secondary | ICD-10-CM | POA: Diagnosis not present

## 2013-07-12 DIAGNOSIS — F411 Generalized anxiety disorder: Secondary | ICD-10-CM | POA: Diagnosis not present

## 2013-07-12 DIAGNOSIS — E785 Hyperlipidemia, unspecified: Secondary | ICD-10-CM | POA: Diagnosis not present

## 2013-08-03 DIAGNOSIS — H4011X0 Primary open-angle glaucoma, stage unspecified: Secondary | ICD-10-CM | POA: Diagnosis not present

## 2013-08-17 DIAGNOSIS — H4011X0 Primary open-angle glaucoma, stage unspecified: Secondary | ICD-10-CM | POA: Diagnosis not present

## 2013-09-05 DIAGNOSIS — Z9849 Cataract extraction status, unspecified eye: Secondary | ICD-10-CM | POA: Diagnosis not present

## 2013-09-05 DIAGNOSIS — Z48298 Encounter for aftercare following other organ transplant: Secondary | ICD-10-CM | POA: Diagnosis not present

## 2013-09-05 DIAGNOSIS — H2589 Other age-related cataract: Secondary | ICD-10-CM | POA: Diagnosis not present

## 2013-09-05 DIAGNOSIS — Z961 Presence of intraocular lens: Secondary | ICD-10-CM | POA: Diagnosis not present

## 2013-09-05 DIAGNOSIS — Z947 Corneal transplant status: Secondary | ICD-10-CM | POA: Diagnosis not present

## 2013-09-14 DIAGNOSIS — M25519 Pain in unspecified shoulder: Secondary | ICD-10-CM | POA: Diagnosis not present

## 2013-09-14 DIAGNOSIS — N2581 Secondary hyperparathyroidism of renal origin: Secondary | ICD-10-CM | POA: Diagnosis not present

## 2013-09-14 DIAGNOSIS — M1710 Unilateral primary osteoarthritis, unspecified knee: Secondary | ICD-10-CM | POA: Diagnosis not present

## 2013-11-02 DIAGNOSIS — M66329 Spontaneous rupture of flexor tendons, unspecified upper arm: Secondary | ICD-10-CM | POA: Diagnosis not present

## 2013-11-22 DIAGNOSIS — Z23 Encounter for immunization: Secondary | ICD-10-CM | POA: Diagnosis not present

## 2013-12-06 DIAGNOSIS — H4011X0 Primary open-angle glaucoma, stage unspecified: Secondary | ICD-10-CM | POA: Diagnosis not present

## 2014-01-16 ENCOUNTER — Ambulatory Visit: Payer: Medicare Other

## 2014-01-16 ENCOUNTER — Ambulatory Visit (INDEPENDENT_AMBULATORY_CARE_PROVIDER_SITE_OTHER): Payer: Medicare Other | Admitting: Emergency Medicine

## 2014-01-16 VITALS — BP 126/56 | HR 76 | Temp 98.2°F | Resp 18 | Ht 63.5 in | Wt 241.0 lb

## 2014-01-16 DIAGNOSIS — R059 Cough, unspecified: Secondary | ICD-10-CM | POA: Diagnosis not present

## 2014-01-16 DIAGNOSIS — R739 Hyperglycemia, unspecified: Secondary | ICD-10-CM

## 2014-01-16 DIAGNOSIS — D72829 Elevated white blood cell count, unspecified: Secondary | ICD-10-CM | POA: Diagnosis not present

## 2014-01-16 DIAGNOSIS — R05 Cough: Secondary | ICD-10-CM

## 2014-01-16 DIAGNOSIS — R7309 Other abnormal glucose: Secondary | ICD-10-CM

## 2014-01-16 LAB — POCT CBC
GRANULOCYTE PERCENT: 69.5 % (ref 37–80)
HCT, POC: 37.7 % (ref 37.7–47.9)
Hemoglobin: 11.3 g/dL — AB (ref 12.2–16.2)
Lymph, poc: 4.1 — AB (ref 0.6–3.4)
MCH, POC: 29.7 pg (ref 27–31.2)
MCHC: 30 g/dL — AB (ref 31.8–35.4)
MCV: 98.9 fL — AB (ref 80–97)
MID (CBC): 1.7 — AB (ref 0–0.9)
MPV: 8.3 fL (ref 0–99.8)
PLATELET COUNT, POC: 304 10*3/uL (ref 142–424)
POC Granulocyte: 13.3 — AB (ref 2–6.9)
POC LYMPH PERCENT: 21.6 %L (ref 10–50)
POC MID %: 8.9 % (ref 0–12)
RBC: 3.81 M/uL — AB (ref 4.04–5.48)
RDW, POC: 18.1 %
WBC: 19.2 10*3/uL — AB (ref 4.6–10.2)

## 2014-01-16 LAB — BASIC METABOLIC PANEL
BUN: 63 mg/dL — AB (ref 6–23)
CALCIUM: 9 mg/dL (ref 8.4–10.5)
CO2: 22 mEq/L (ref 19–32)
Chloride: 103 mEq/L (ref 96–112)
Creat: 2.7 mg/dL — ABNORMAL HIGH (ref 0.50–1.10)
Glucose, Bld: 160 mg/dL — ABNORMAL HIGH (ref 70–99)
Potassium: 4.2 mEq/L (ref 3.5–5.3)
Sodium: 138 mEq/L (ref 135–145)

## 2014-01-16 LAB — POCT INFLUENZA A/B
INFLUENZA B, POC: NEGATIVE
Influenza A, POC: NEGATIVE

## 2014-01-16 LAB — GLUCOSE, POCT (MANUAL RESULT ENTRY): POC GLUCOSE: 161 mg/dL — AB (ref 70–99)

## 2014-01-16 MED ORDER — DOXYCYCLINE HYCLATE 100 MG PO CAPS: 100.0000 mg | ORAL_CAPSULE | Freq: Two times a day (BID) | ORAL | Status: DC

## 2014-01-16 NOTE — Progress Notes (Addendum)
Subjective:    Patient ID: [name] James, female    DOB: 02-09-1936, 78 y.o.   MRN: [id number] This chart was scribed for Darlyne Russian, MD by Anastasia Pall, ED Scribe. This patient was seen in room 12 and the patient's care was started at 12:03 PM.  Chief Complaint  Patient presents with  . Chest Congestion    X 6 days  . Cough    X 6 days    HPI [name] James is a 78 y.o. female She presents with chest congestion and cough, onset 6 days ago. She reports trouble sleeping last night due to persistent coughing, and states she is coughing up green sputum. She also reports body aches. She denies fever, sore throat. She states she has had flu immunization this year. She reports being allergic to multiple antibiotics, including Penicillin. She denies renal trouble, but reports only having one kidney. She reports having kidney removed due to h/o DM. She states that she was told if her kidney levels increase anymore she will have to go on dialysis. She has a spot on her left nare that Dr. Allyson Sabal  is going to remove. She reports h/o LE edema.   PCP - Shirline Frees, MD  There are no active problems to display for this patient.  Prior to Admission medications   Medication Sig Start Date End Date Taking? Authorizing Provider  allopurinol (ZYLOPRIM) 100 MG tablet Take 100 mg by mouth 2 (two) times daily.   Yes Historical Provider, MD  aspirin EC 81 MG tablet Take 81 mg by mouth daily.   Yes Historical Provider, MD  atenolol (TENORMIN) 25 MG tablet Take 50 mg by mouth 2 (two) times daily.    Yes Historical Provider, MD  brimonidine (ALPHAGAN) 0.2 % ophthalmic solution 3 (three) times daily.   Yes Historical Provider, MD  dorzolamide-timolol (COSOPT) 22.3-6.8 MG/ML ophthalmic solution 1 drop 2 (two) times daily.   Yes Historical Provider, MD  furosemide (LASIX) 40 MG tablet Take 40 mg by mouth 2 (two) times daily.   Yes Historical Provider, MD  glipiZIDE (GLUCOTROL) 10 MG tablet Take 10 mg by  mouth 2 (two) times daily before a meal.   Yes Historical Provider, MD  insulin NPH-insulin regular (NOVOLIN 70/30) (70-30) 100 UNIT/ML injection Inject 64 Units into the skin 2 (two) times daily with a meal.   Yes Historical Provider, MD  Insulin Syringe-Needle U-100 30G X 1/2" 1 ML MISC by Does not apply route.   Yes Historical Provider, MD  losartan (COZAAR) 50 MG tablet Take 50 mg by mouth daily.   Yes Historical Provider, MD  ranitidine (ZANTAC) 150 MG capsule Take 150 mg by mouth 2 (two) times daily.   Yes Historical Provider, MD  simvastatin (ZOCOR) 20 MG tablet Take 20 mg by mouth every evening.   Yes Historical Provider, MD  diazepam (VALIUM) 5 MG tablet Take 5 mg by mouth 2 (two) times daily.    Historical Provider, MD  sitaGLIPtin (JANUVIA) 100 MG tablet Take 100 mg by mouth daily.    Historical Provider, MD    Review of Systems  Constitutional: Negative for fever.  HENT: Positive for congestion. Negative for sore throat.   Respiratory: Positive for cough (productive of green sputum).   Cardiovascular: Positive for leg swelling (h/o LE edema).  Musculoskeletal: Positive for arthralgias (+ for body aches).      Objective:   Physical Exam  Nursing note and vitals reviewed. Constitutional: She is oriented to  person, place, and time. She appears well-developed and well-nourished. No distress.  HENT:  Head: Normocephalic and atraumatic.  Right Ear: External ear normal.  Left Ear: External ear normal.  Nose: Nose normal.  Mouth/Throat: Oropharynx is clear and moist. No oropharyngeal exudate.  Eyes: EOM are normal.  Neck: Neck supple. No thyromegaly present.  Cardiovascular: Normal rate, regular rhythm and normal heart sounds.   No murmur heard. Pulmonary/Chest: Effort normal and breath sounds normal. No respiratory distress. She has no wheezes. She has no rales.  Musculoskeletal: Normal range of motion.  Lymphadenopathy:    She has no cervical adenopathy.  Neurological: She  is alert and oriented to person, place, and time.  Skin: Skin is warm and dry.  Psychiatric: She has a normal mood and affect. Her behavior is normal.   BP 126/56  Pulse 76  Temp(Src) 98.2 F (36.8 C) (Oral)  Resp 18  Ht 5' 3.5" (1.613 m)  Wt 241 lb (109.317 kg)  BMI 42.02 kg/m2  SpO2 93%  [facility] reading (PRIMARY) by Dr. Everlene Farrier no pneumonia is seen. Results for orders placed in visit on 01/16/14  GLUCOSE, POCT (MANUAL RESULT ENTRY)      Result Value Range   POC Glucose 161 (*) 70 - 99 mg/dl   Results for orders placed in visit on 01/16/14  POCT CBC      Result Value Range   WBC 19.2 (*) 4.6 - 10.2 K/uL   Lymph, poc 4.1 (*) 0.6 - 3.4   POC LYMPH PERCENT 21.6  10 - 50 %L   MID (cbc) 1.7 (*) 0 - 0.9   POC MID % 8.9  0 - 12 %M   POC Granulocyte 13.3 (*) 2 - 6.9   Granulocyte percent 69.5  37 - 80 %G   RBC 3.81 (*) 4.04 - 5.48 M/uL   Hemoglobin 11.3 (*) 12.2 - 16.2 g/dL   HCT, POC 37.7  37.7 - 47.9 %   MCV 98.9 (*) 80 - 97 fL   MCH, POC 29.7  27 - 31.2 pg   MCHC 30.0 (*) 31.8 - 35.4 g/dL   RDW, POC 18.1     Platelet Count, POC 304  142 - 424 K/uL   MPV 8.3  0 - 99.8 fL  POCT INFLUENZA A/B      Result Value Range   Influenza A, POC Negative     Influenza B, POC Negative    GLUCOSE, POCT (MANUAL RESULT ENTRY)      Result Value Range   POC Glucose 161 (*) 70 - 99 mg/dl      Assessment & Plan:  No diagnosis found. Patient coughing up colored phlegm. Her white count has been elevated but slowly climbing and I spoke with Dr. Tommie Raymond Harris's  office and they will make an appointment for her to followup with hematology and will contact her for a followup CBC the end of the week. Because of her  Multiple allergies and renal disease we'll treat with doxycycline 100 twice a day #14    I personally performed the services described in this documentation, which was scribed in my presence. The recorded information has been reviewed and is accurate.

## 2014-01-16 NOTE — Patient Instructions (Signed)
I spoke with Dr. Kenton Kingfisher. We will start her on doxycycline and he will make the appointment for followup with hematology and followup on her CBC.

## 2014-01-17 LAB — PATHOLOGIST SMEAR REVIEW

## 2014-01-23 ENCOUNTER — Telehealth: Payer: Self-pay | Admitting: Oncology

## 2014-01-23 NOTE — Telephone Encounter (Signed)
CALLED PT TO SCHEDULED NP APPT PER PT WILL CALL TO SCHEDULE WHEN TRANSPORTATION IS AVAILABLE.

## 2014-01-26 DIAGNOSIS — R3589 Other polyuria: Secondary | ICD-10-CM | POA: Diagnosis not present

## 2014-01-26 DIAGNOSIS — D72829 Elevated white blood cell count, unspecified: Secondary | ICD-10-CM | POA: Diagnosis not present

## 2014-01-26 DIAGNOSIS — R358 Other polyuria: Secondary | ICD-10-CM | POA: Diagnosis not present

## 2014-02-06 DIAGNOSIS — E785 Hyperlipidemia, unspecified: Secondary | ICD-10-CM | POA: Diagnosis not present

## 2014-02-06 DIAGNOSIS — E1149 Type 2 diabetes mellitus with other diabetic neurological complication: Secondary | ICD-10-CM | POA: Diagnosis not present

## 2014-02-06 DIAGNOSIS — F411 Generalized anxiety disorder: Secondary | ICD-10-CM | POA: Diagnosis not present

## 2014-02-06 DIAGNOSIS — E1142 Type 2 diabetes mellitus with diabetic polyneuropathy: Secondary | ICD-10-CM | POA: Diagnosis not present

## 2014-02-06 DIAGNOSIS — N390 Urinary tract infection, site not specified: Secondary | ICD-10-CM | POA: Diagnosis not present

## 2014-02-06 DIAGNOSIS — I10 Essential (primary) hypertension: Secondary | ICD-10-CM | POA: Diagnosis not present

## 2014-02-06 DIAGNOSIS — N183 Chronic kidney disease, stage 3 unspecified: Secondary | ICD-10-CM | POA: Diagnosis not present

## 2014-02-07 ENCOUNTER — Telehealth: Payer: Self-pay | Admitting: Oncology

## 2014-02-07 NOTE — Telephone Encounter (Signed)
CALLED PATIENT TO SCHEDULE NEW PATIENT PER PATIENT WILL CALL BACK AFTER SPEAKING WITH DR. HARRIS OFFICE CONTACT NUMBER WAS GIVEN.

## 2014-03-16 ENCOUNTER — Ambulatory Visit (INDEPENDENT_AMBULATORY_CARE_PROVIDER_SITE_OTHER): Payer: Medicare Other | Admitting: Family Medicine

## 2014-03-16 VITALS — BP 140/60 | HR 77 | Temp 98.4°F | Resp 18 | Ht 63.5 in | Wt 242.0 lb

## 2014-03-16 DIAGNOSIS — R5383 Other fatigue: Secondary | ICD-10-CM

## 2014-03-16 DIAGNOSIS — R5381 Other malaise: Secondary | ICD-10-CM | POA: Diagnosis not present

## 2014-03-16 DIAGNOSIS — R300 Dysuria: Secondary | ICD-10-CM | POA: Diagnosis not present

## 2014-03-16 DIAGNOSIS — E1149 Type 2 diabetes mellitus with other diabetic neurological complication: Secondary | ICD-10-CM

## 2014-03-16 DIAGNOSIS — N390 Urinary tract infection, site not specified: Secondary | ICD-10-CM

## 2014-03-16 LAB — POCT UA - MICROSCOPIC ONLY
CRYSTALS, UR, HPF, POC: NEGATIVE
Casts, Ur, LPF, POC: NEGATIVE
Mucus, UA: NEGATIVE
Yeast, UA: NEGATIVE

## 2014-03-16 LAB — POCT URINALYSIS DIPSTICK
BILIRUBIN UA: NEGATIVE
GLUCOSE UA: NEGATIVE
Ketones, UA: NEGATIVE
Nitrite, UA: NEGATIVE
Protein, UA: 300
SPEC GRAV UA: 1.02
Urobilinogen, UA: 0.2
pH, UA: 5

## 2014-03-16 LAB — POCT CBC
Granulocyte percent: 76.1 %G (ref 37–80)
HCT, POC: 36.6 % — AB (ref 37.7–47.9)
Hemoglobin: 10.8 g/dL — AB (ref 12.2–16.2)
Lymph, poc: 3.2 (ref 0.6–3.4)
MCH, POC: 28.5 pg (ref 27–31.2)
MCHC: 29.5 g/dL — AB (ref 31.8–35.4)
MCV: 96.5 fL (ref 80–97)
MID (cbc): 1.1 — AB (ref 0–0.9)
MPV: 8.2 fL (ref 0–99.8)
POC Granulocyte: 13.5 — AB (ref 2–6.9)
POC LYMPH PERCENT: 17.9 %L (ref 10–50)
POC MID %: 6 % (ref 0–12)
Platelet Count, POC: 369 10*3/uL (ref 142–424)
RBC: 3.79 M/uL — AB (ref 4.04–5.48)
RDW, POC: 18.2 %
WBC: 17.7 10*3/uL — AB (ref 4.6–10.2)

## 2014-03-16 LAB — GLUCOSE, POCT (MANUAL RESULT ENTRY): POC Glucose: 280 mg/dl — AB (ref 70–99)

## 2014-03-16 MED ORDER — CEPHALEXIN 250 MG PO CAPS: 250.0000 mg | ORAL_CAPSULE | Freq: Two times a day (BID) | ORAL | Status: DC

## 2014-03-16 MED ORDER — CEFTRIAXONE SODIUM 1 G IJ SOLR
1.0000 g | Freq: Once | INTRAMUSCULAR | Status: AC
  Administered 2014-03-16: 1 g via INTRAMUSCULAR

## 2014-03-16 NOTE — Patient Instructions (Signed)
Urinary Tract Infection  Urinary tract infections (UTIs) can develop anywhere along your urinary tract. Your urinary tract is your body's drainage system for removing wastes and extra water. Your urinary tract includes two kidneys, two ureters, a bladder, and a urethra. Your kidneys are a pair of bean-shaped organs. Each kidney is about the size of your fist. They are located below your ribs, one on each side of your spine.  CAUSES  Infections are caused by microbes, which are microscopic organisms, including fungi, viruses, and bacteria. These organisms are so small that they can only be seen through a microscope. Bacteria are the microbes that most commonly cause UTIs.  SYMPTOMS   Symptoms of UTIs may vary by age and gender of the patient and by the location of the infection. Symptoms in young women typically include a frequent and intense urge to urinate and a painful, burning feeling in the bladder or urethra during urination. Older women and men are more likely to be tired, shaky, and weak and have muscle aches and abdominal pain. A fever may mean the infection is in your kidneys. Other symptoms of a kidney infection include pain in your back or sides below the ribs, nausea, and vomiting.  DIAGNOSIS  To diagnose a UTI, your caregiver will ask you about your symptoms. Your caregiver also will ask to provide a urine sample. The urine sample will be tested for bacteria and white blood cells. White blood cells are made by your body to help fight infection.  TREATMENT   Typically, UTIs can be treated with medication. Because most UTIs are caused by a bacterial infection, they usually can be treated with the use of antibiotics. The choice of antibiotic and length of treatment depend on your symptoms and the type of bacteria causing your infection.  HOME CARE INSTRUCTIONS   If you were prescribed antibiotics, take them exactly as your caregiver instructs you. Finish the medication even if you feel better after you  have only taken some of the medication.   Drink enough water and fluids to keep your urine clear or pale yellow.   Avoid caffeine, tea, and carbonated beverages. They tend to irritate your bladder.   Empty your bladder often. Avoid holding urine for long periods of time.   Empty your bladder before and after sexual intercourse.   After a bowel movement, women should cleanse from front to back. Use each tissue only once.  SEEK MEDICAL CARE IF:    You have back pain.   You develop a fever.   Your symptoms do not begin to resolve within 3 days.  SEEK IMMEDIATE MEDICAL CARE IF:    You have severe back pain or lower abdominal pain.   You develop chills.   You have nausea or vomiting.   You have continued burning or discomfort with urination.  MAKE SURE YOU:    Understand these instructions.   Will watch your condition.   Will get help right away if you are not doing well or get worse.  Document Released: 09/17/2005 Document Revised: 06/08/2012 Document Reviewed: 01/16/2012  [employer] Patient Information 2014 [employer]

## 2014-03-16 NOTE — Progress Notes (Signed)
Chief Complaint:  Chief Complaint  Patient presents with  . recheck bladder infection    pt has been dizzy, fatigued, dysuria. was given macrobid for UTI from primary doctor    HPI: [name] James is a 78 y.o. female who is here for fatigue for the last 1 week or more due to what she thinks are UTI sxs. She has not been doing well since January with resipiratory symptoms and an unexplained leukocytosis so was treated with Doxycyline due to her many drug allergies and chronic kidney diseases. Her WBC at that time 2 months ago was 19.2. She is a patient's of Dr Kenton Kingfisher and per the OV note in January , Dr Everlene Farrier had called his office and there was a pending hematology referral for this unknown rising leukocytosis. She is here today because she has not felt well since January and then approximately 3 weeks ago started having UTI sxs, increase frequency, urgeny and generalized weakness more than normal, and decrease oral intake. She was given another round of doxycyline for 10 days  but continued to have sxs and so her PCP gave her a 7 day course of macrobid. In total she has had  Doxycycline x 2 and macrobid x 1IN January  She denies any feers or chills. SHe has diabetes and also CKD, stage 3 . Per patient her GFR is about 30. Her diabetes has been not well controlled due to the infection prior to that everything was good. Deneis n/v/abd pain/vaginal dc/hematuria/worsening back pain.  Since she has been on doxy , no urine samples were taken and no cultures were taken. She is here after hours so I annot call Dr Kenton Kingfisher' office to get prior urine cultures so get prior  c/s.  She has a remote history of PCN allergy but does not remember what is is. She has taken keflex in the past multiple times without problems.  She is here with her daughter who works at [employer] for the [employer], she lives with her daughter. Per her daughter she is not as "peppy" as she normally is.     Past Medical History  Diagnosis  Date  . CHF (congestive heart failure) 05/13/2011    echo - [id number]; stage 1 diastolic dysfunction; elevated LV filling pressure, MAC  . Chest pain, atypical 12/05/2008    R/Lmv- normal perfusion all regions, noe ECG changes   . Renal insufficiency 10/30/2005    doppler - abn resistance consistent w/ parenchymal disease  . Claudication 10/30/2005    doppler - normal evaluation, no evidence of aneurysm, diameter reduction, dissection, compression or vascular abnormality)  . DM (diabetes mellitus)   . Peripheral neuropathy   . Hypertension   . Edema   . Gout   . Allergy   . Anxiety   . Arthritis   . Cataract   . GERD (gastroesophageal reflux disease)    Past Surgical History  Procedure Laterality Date  . Mastectomy Bilateral   . Back surgery    . Hemorrhoid surgery    . Breast surgery    . Cholecystectomy    . Eye surgery     History   Social History  . Marital Status: Divorced    Spouse Name: N/A    Number of Children: N/A  . Years of Education: N/A   Social History Main Topics  . Smoking status: Never Smoker   . Smokeless tobacco: None  . Alcohol Use: No  . Drug Use: None  .  Sexual Activity: None   Other Topics Concern  . None   Social History Narrative  . None   Family History  Problem Relation Age of Onset  . Cancer Mother   . Diabetes Mother   . Heart disease Mother   . Heart attack Father   . Stroke Father   . Parkinson's disease Brother   . Cancer Sister    Allergies  Allergen Reactions  . Codeine   . Erythromycin   . Penicillins    Prior to Admission medications   Medication Sig Start Date End Date Taking? Authorizing Provider  allopurinol (ZYLOPRIM) 100 MG tablet Take 100 mg by mouth 2 (two) times daily.   Yes Historical Provider, MD  aspirin EC 81 MG tablet Take 81 mg by mouth daily.   Yes Historical Provider, MD  atenolol (TENORMIN) 25 MG tablet Take 50 mg by mouth 2 (two) times daily.    Yes Historical Provider, MD  brimonidine (ALPHAGAN)  0.2 % ophthalmic solution 3 (three) times daily.   Yes Historical Provider, MD  diazepam (VALIUM) 5 MG tablet Take 5 mg by mouth 2 (two) times daily.   Yes Historical Provider, MD  dorzolamide-timolol (COSOPT) 22.3-6.8 MG/ML ophthalmic solution 1 drop 2 (two) times daily.   Yes Historical Provider, MD  furosemide (LASIX) 40 MG tablet Take 40 mg by mouth 2 (two) times daily.   Yes Historical Provider, MD  glipiZIDE (GLUCOTROL) 10 MG tablet Take 10 mg by mouth 2 (two) times daily before a meal.   Yes Historical Provider, MD  insulin NPH-insulin regular (NOVOLIN 70/30) (70-30) 100 UNIT/ML injection Inject 64 Units into the skin 2 (two) times daily with a meal.   Yes Historical Provider, MD  Insulin Syringe-Needle U-100 30G X 1/2" 1 ML MISC by Does not apply route.   Yes Historical Provider, MD  losartan (COZAAR) 50 MG tablet Take 50 mg by mouth daily.   Yes Historical Provider, MD  ranitidine (ZANTAC) 150 MG capsule Take 150 mg by mouth 2 (two) times daily.   Yes Historical Provider, MD  simvastatin (ZOCOR) 20 MG tablet Take 20 mg by mouth every evening.   Yes Historical Provider, MD  sitaGLIPtin (JANUVIA) 100 MG tablet Take 100 mg by mouth daily.   Yes Historical Provider, MD     ROS: The patient denies fevers, chills, night sweats, unintentional weight loss, chest pain, palpitations, wheezing, dyspnea on exertion, nausea, vomiting, abdominal pain, dysuria, hematuria, melena,  Acute numbness, weakness, or tingling.   All other systems have been reviewed and were otherwise negative with the exception of those mentioned in the HPI and as above.    PHYSICAL EXAM: Filed Vitals:   03/16/14 1931  BP: 140/60  Pulse: 77  Temp: 98.4 F (36.9 C)  Resp: 18  Spo2 92-93% Filed Vitals:   03/16/14 1931  Height: 5' 3.5" (1.613 m)  Weight: 242 lb (109.77 kg)   Body mass index is 42.19 kg/(m^2).  General: Alert, no acute distress, obese elderly white female  HEENT:  Normocephalic, atraumatic,  oropharynx patent. EOMI, PERRLA Cardiovascular:  Regular rate and rhythm, no rubs murmurs or gallops.  No Carotid bruits, radial pulse intact. No pedal edema.  Respiratory: Clear to auscultation bilaterally.  No wheezes, rales, or rhonchi.  No cyanosis, no use of accessory musculature GI: No organomegaly, abdomen is soft and non-tender, positive bowel sounds.  No masses. Skin: No rashes. Neurologic: Facial musculature symmetric. UE nl, 5/5 strength, no e/o CVA/TIA Psychiatric: Patient is appropriate throughout  our interaction. Lymphatic: No cervical lymphadenopathy Musculoskeletal: She uses a walker. NO CVA tenderness.     LABS: Results for orders placed in visit on 03/16/14  POCT URINALYSIS DIPSTICK      Result Value Ref Range   Color, UA yellow     Clarity, UA cloudy     Glucose, UA neg     Bilirubin, UA neg     Ketones, UA neg     Spec Grav, UA 1.020     Blood, UA small     pH, UA 5.0     Protein, UA 300     Urobilinogen, UA 0.2     Nitrite, UA neg     Leukocytes, UA small (1+)    POCT UA - MICROSCOPIC ONLY      Result Value Ref Range   WBC, Ur, HPF, POC 35-45     RBC, urine, microscopic 2-4     Bacteria, U Microscopic 1+     Mucus, UA neg     Epithelial cells, urine per micros 3-6     Crystals, Ur, HPF, POC neg     Casts, Ur, LPF, POC neg     Yeast, UA neg    POCT CBC      Result Value Ref Range   WBC 17.7 (*) 4.6 - 10.2 K/uL   Lymph, poc 3.2  0.6 - 3.4   POC LYMPH PERCENT 17.9  10 - 50 %L   MID (cbc) 1.1 (*) 0 - 0.9   POC MID % 6.0  0 - 12 %M   POC Granulocyte 13.5 (*) 2 - 6.9   Granulocyte percent 76.1  37 - 80 %G   RBC 3.79 (*) 4.04 - 5.48 M/uL   Hemoglobin 10.8 (*) 12.2 - 16.2 g/dL   HCT, POC 36.6 (*) 37.7 - 47.9 %   MCV 96.5  80 - 97 fL   MCH, POC 28.5  27 - 31.2 pg   MCHC 29.5 (*) 31.8 - 35.4 g/dL   RDW, POC 18.2     Platelet Count, POC 369  142 - 424 K/uL   MPV 8.2  0 - 99.8 fL  GLUCOSE, POCT (MANUAL RESULT ENTRY)      Result Value Ref Range   POC  Glucose 280 (*) 70 - 99 mg/dl     EKG/XRAY:   Primary read interpreted by Dr. Marin Comment at [facility]   ASSESSMENT/PLAN: Encounter Diagnoses  Name Primary?  . Dysuria Yes  . Other malaise and fatigue   . Type II or unspecified type diabetes mellitus with neurological manifestations, not stated as uncontrolled   . UTI (urinary tract infection)    [name] James is a pleasant  78 y/o female with stage 3  CKD, anemia of chronic disease, HTN, Type 2 DM who presents with UTI sxs for the last 3 weeks or so and a persistently hight white count for several months now.  She has a pending hematology appt per my understanding from prior OV note from January She has UTI on today's exam. We discussed in lengthe that she has failed 2 outpatient course of abx : doxycyline and macrobid in the last 3 weeks and due to her multiple comorbidiites and also poor PO intake it may be better for her to go to the ER. Risks and Benfits explained. She  Was offered opportunity to go to ER and she declined. She also declined IVF since not eating or drinking well. I looked through the available medical records  and did not see any other abx given to her besides doxycyline and vancomycin in the past. She tolerated both well. However she has had 2 rounds of doxy and 1 round of macrobid and continues to have UTI sxs and a + UA. We discussed different med options in this setting. SHe has taken Keflex before and so I feel comfortable that she may tolerate and benefit from an IM injection of Rocephin without any problems and also renal dosing of Keflex 250 mg BID x 10 Days.  D/w her risk and benefits of rocephien injection. She agreed to getting an injection , her daughter was also in agreement, after risks and benefits explained.  Rocephin 1 gram x 1 in office. Rx Keflex 250 mg BID x 10 Days ALthough has been on abx will go ahead and get urine cx in event there is some resistance. CMP pending due to h/o CKD, recent macrobid uses, poor PO.    She has trace hematurai-most likely from UTI but if cont to persist after UTI improves then need evaluation, spoke with pt and daughter about addressing that a later time.  F/u in 2 days and will get repeat labs, otherwise sooner prn, if feeling worse or new sxs go to ER prn  More than 30 min face to face time spent with pt Gross sideeffects, risk and benefits, and alternatives of medications d/w patient. Patient is aware that all medications have potential sideeffects and we are unable to predict every sideeffect or drug-drug interaction that may occur.  LE, [place], DO 03/16/2014 8:48 PM

## 2014-03-17 LAB — COMPREHENSIVE METABOLIC PANEL
ALT: 35 U/L (ref 0–35)
AST: 32 U/L (ref 0–37)
Albumin: 3.3 g/dL — ABNORMAL LOW (ref 3.5–5.2)
BUN: 49 mg/dL — ABNORMAL HIGH (ref 6–23)
CO2: 25 mEq/L (ref 19–32)
Calcium: 8.9 mg/dL (ref 8.4–10.5)
Chloride: 101 mEq/L (ref 96–112)
Creat: 2.09 mg/dL — ABNORMAL HIGH (ref 0.50–1.10)
Potassium: 4.5 mEq/L (ref 3.5–5.3)
Total Bilirubin: 0.3 mg/dL (ref 0.2–1.2)

## 2014-03-17 LAB — COMPREHENSIVE METABOLIC PANEL WITH GFR
Alkaline Phosphatase: 107 U/L (ref 39–117)
Glucose, Bld: 260 mg/dL — ABNORMAL HIGH (ref 70–99)
Sodium: 138 meq/L (ref 135–145)
Total Protein: 6.5 g/dL (ref 6.0–8.3)

## 2014-03-18 NOTE — Addendum Note (Signed)
Addended by: Kyra Manges on: 03/18/2014 09:04 AM   Modules accepted: Orders

## 2014-03-19 ENCOUNTER — Other Ambulatory Visit (INDEPENDENT_AMBULATORY_CARE_PROVIDER_SITE_OTHER): Payer: Medicare Other | Admitting: Family Medicine

## 2014-03-19 DIAGNOSIS — R5381 Other malaise: Secondary | ICD-10-CM | POA: Diagnosis not present

## 2014-03-19 DIAGNOSIS — R5383 Other fatigue: Principal | ICD-10-CM

## 2014-03-19 LAB — POCT CBC
Granulocyte percent: 63.8 %G (ref 37–80)
HCT, POC: 34.6 % — AB (ref 37.7–47.9)
Hemoglobin: 10.3 g/dL — AB (ref 12.2–16.2)
Lymph, poc: 3.3 (ref 0.6–3.4)
MCH, POC: 28.6 pg (ref 27–31.2)
MCHC: 29.8 g/dL — AB (ref 31.8–35.4)
MCV: 96.2 fL (ref 80–97)
MID (cbc): 0.6 (ref 0–0.9)
MPV: 7.7 fL (ref 0–99.8)
POC Granulocyte: 6.8 (ref 2–6.9)
POC LYMPH PERCENT: 30.5 %L (ref 10–50)
POC MID %: 5.7 % (ref 0–12)
Platelet Count, POC: 327 10*3/uL (ref 142–424)
RBC: 3.6 M/uL — AB (ref 4.04–5.48)
RDW, POC: 18 %
WBC: 10.7 10*3/uL — AB (ref 4.6–10.2)

## 2014-03-19 LAB — URINE CULTURE: Colony Count: >=100000

## 2014-03-19 NOTE — Progress Notes (Signed)
Patient here for lab only 

## 2014-03-20 ENCOUNTER — Other Ambulatory Visit: Payer: Self-pay | Admitting: Family Medicine

## 2014-03-20 ENCOUNTER — Encounter: Payer: Self-pay | Admitting: Family Medicine

## 2014-03-20 DIAGNOSIS — D72829 Elevated white blood cell count, unspecified: Secondary | ICD-10-CM

## 2014-03-20 DIAGNOSIS — N189 Chronic kidney disease, unspecified: Secondary | ICD-10-CM

## 2014-03-31 DIAGNOSIS — D72829 Elevated white blood cell count, unspecified: Secondary | ICD-10-CM | POA: Diagnosis not present

## 2014-04-02 ENCOUNTER — Telehealth: Payer: Self-pay

## 2014-04-02 ENCOUNTER — Ambulatory Visit (INDEPENDENT_AMBULATORY_CARE_PROVIDER_SITE_OTHER): Payer: Medicare Other | Admitting: Emergency Medicine

## 2014-04-02 VITALS — BP 124/84 | HR 70 | Temp 98.1°F | Resp 16 | Ht 63.5 in | Wt 233.0 lb

## 2014-04-02 DIAGNOSIS — R300 Dysuria: Secondary | ICD-10-CM

## 2014-04-02 DIAGNOSIS — N390 Urinary tract infection, site not specified: Secondary | ICD-10-CM | POA: Diagnosis not present

## 2014-04-02 LAB — POCT URINALYSIS DIPSTICK
BILIRUBIN UA: NEGATIVE
Glucose, UA: NEGATIVE
Ketones, UA: NEGATIVE
Nitrite, UA: NEGATIVE
Protein, UA: 300
Spec Grav, UA: 1.025
UROBILINOGEN UA: 0.2
pH, UA: 5.5

## 2014-04-02 LAB — POCT UA - MICROSCOPIC ONLY
CASTS, UR, LPF, POC: NEGATIVE
Mucus, UA: POSITIVE
Yeast, UA: NEGATIVE

## 2014-04-02 LAB — POCT CBC
Granulocyte percent: 64.8 %G (ref 37–80)
HEMATOCRIT: 38.1 % (ref 37.7–47.9)
Hemoglobin: 11.4 g/dL — AB (ref 12.2–16.2)
Lymph, poc: 3.2 (ref 0.6–3.4)
MCH: 28.6 pg (ref 27–31.2)
MCHC: 29.9 g/dL — AB (ref 31.8–35.4)
MCV: 95.6 fL (ref 80–97)
MID (cbc): 0.8 (ref 0–0.9)
MPV: 9.8 fL (ref 0–99.8)
POC Granulocyte: 7.5 — AB (ref 2–6.9)
POC LYMPH %: 27.9 % (ref 10–50)
POC MID %: 7.3 %M (ref 0–12)
Platelet Count, POC: 291 10*3/uL (ref 142–424)
RBC: 3.99 M/uL — AB (ref 4.04–5.48)
RDW, POC: 18.6 %
WBC: 11.6 10*3/uL — AB (ref 4.6–10.2)

## 2014-04-02 LAB — BASIC METABOLIC PANEL
BUN: 44 mg/dL — AB (ref 6–23)
CALCIUM: 9 mg/dL (ref 8.4–10.5)
CO2: 22 meq/L (ref 19–32)
Chloride: 107 mEq/L (ref 96–112)
Creat: 1.86 mg/dL — ABNORMAL HIGH (ref 0.50–1.10)
GLUCOSE: 114 mg/dL — AB (ref 70–99)
Potassium: 4.3 mEq/L (ref 3.5–5.3)
Sodium: 142 mEq/L (ref 135–145)

## 2014-04-02 MED ORDER — CEFTRIAXONE SODIUM 1 G IJ SOLR
1.0000 g | Freq: Once | INTRAMUSCULAR | Status: AC
Start: 2014-04-02 — End: 2014-04-02
  Administered 2014-04-02: 1 g via INTRAMUSCULAR

## 2014-04-02 MED ORDER — CEFTRIAXONE SODIUM 1 G IJ SOLR: 1.0000 g | Freq: Once | INTRAMUSCULAR | Status: DC

## 2014-04-02 MED ORDER — CIPROFLOXACIN HCL 500 MG PO TABS: 500.0000 mg | ORAL_TABLET | Freq: Two times a day (BID) | ORAL | Status: DC

## 2014-04-02 NOTE — Telephone Encounter (Signed)
This medication is contraindicated based on her creatinine clearance.

## 2014-04-02 NOTE — Patient Instructions (Signed)
Urinary Tract Infection  Urinary tract infections (UTIs) can develop anywhere along your urinary tract. Your urinary tract is your body's drainage system for removing wastes and extra water. Your urinary tract includes two kidneys, two ureters, a bladder, and a urethra. Your kidneys are a pair of bean-shaped organs. Each kidney is about the size of your fist. They are located below your ribs, one on each side of your spine.  CAUSES  Infections are caused by microbes, which are microscopic organisms, including fungi, viruses, and bacteria. These organisms are so small that they can only be seen through a microscope. Bacteria are the microbes that most commonly cause UTIs.  SYMPTOMS   Symptoms of UTIs may vary by age and gender of the patient and by the location of the infection. Symptoms in young women typically include a frequent and intense urge to urinate and a painful, burning feeling in the bladder or urethra during urination. Older women and men are more likely to be tired, shaky, and weak and have muscle aches and abdominal pain. A fever may mean the infection is in your kidneys. Other symptoms of a kidney infection include pain in your back or sides below the ribs, nausea, and vomiting.  DIAGNOSIS  To diagnose a UTI, your caregiver will ask you about your symptoms. Your caregiver also will ask to provide a urine sample. The urine sample will be tested for bacteria and white blood cells. White blood cells are made by your body to help fight infection.  TREATMENT   Typically, UTIs can be treated with medication. Because most UTIs are caused by a bacterial infection, they usually can be treated with the use of antibiotics. The choice of antibiotic and length of treatment depend on your symptoms and the type of bacteria causing your infection.  HOME CARE INSTRUCTIONS   If you were prescribed antibiotics, take them exactly as your caregiver instructs you. Finish the medication even if you feel better after you  have only taken some of the medication.   Drink enough water and fluids to keep your urine clear or pale yellow.   Avoid caffeine, tea, and carbonated beverages. They tend to irritate your bladder.   Empty your bladder often. Avoid holding urine for long periods of time.   Empty your bladder before and after sexual intercourse.   After a bowel movement, women should cleanse from front to back. Use each tissue only once.  SEEK MEDICAL CARE IF:    You have back pain.   You develop a fever.   Your symptoms do not begin to resolve within 3 days.  SEEK IMMEDIATE MEDICAL CARE IF:    You have severe back pain or lower abdominal pain.   You develop chills.   You have nausea or vomiting.   You have continued burning or discomfort with urination.  MAKE SURE YOU:    Understand these instructions.   Will watch your condition.   Will get help right away if you are not doing well or get worse.  Document Released: 09/17/2005 Document Revised: 06/08/2012 Document Reviewed: 01/16/2012  [employer] Patient Information 2014 [employer]

## 2014-04-02 NOTE — Telephone Encounter (Signed)
PATIENT WAS SEEN BY DR Ouida Sills AND WAS DIAGNOSED WITH A BLADDER INFECITON. PATIENT WANTS "PERIDIUM" PRESCRIBED FOR HER BLADDER INFECTION. SHE STATES HER OTHER DOCTOR HAS PRESCRIBED IT FOR HER IN THE PAST WHEN SHE HAD A BLADDER INFECTION AND WANTS IT TO BE PRESCRIBED FOR HER AGAIN.

## 2014-04-02 NOTE — Progress Notes (Signed)
[facility] [address], [place] [address] [phone]  Date:  04/02/2014   Name:  [name] James   DOB:  1936/11/17   MRN:  [id number]  PCP:  Shirline Frees, MD    Chief Complaint: Dysuria and Urinary Urgency   History of Present Illness:  [name] James is a 78 y.o. very pleasant female patient who presents with the following:  Seen repeatedly with UA.  Treated with keflex and responded with decrease in WBC.  Has recurrent dysuria, urgency and pressure.  Has malaise and increasing fatigue.  No fever or chills. No nausea or vomiting.  No stool change.  No improvement with over the counter medications or other home remedies. Denies other complaint or health concern today.   There are no active problems to display for this patient.   Past Medical History  Diagnosis Date  . CHF (congestive heart failure) 05/13/2011    echo - [id number]; stage 1 diastolic dysfunction; elevated LV filling pressure, MAC  . Chest pain, atypical 12/05/2008    R/Lmv- normal perfusion all regions, noe ECG changes   . Renal insufficiency 10/30/2005    doppler - abn resistance consistent w/ parenchymal disease  . Claudication 10/30/2005    doppler - normal evaluation, no evidence of aneurysm, diameter reduction, dissection, compression or vascular abnormality)  . DM (diabetes mellitus)   . Peripheral neuropathy   . Hypertension   . Edema   . Gout   . Allergy   . Anxiety   . Arthritis   . Cataract   . GERD (gastroesophageal reflux disease)     Past Surgical History  Procedure Laterality Date  . Mastectomy Bilateral   . Back surgery    . Hemorrhoid surgery    . Breast surgery    . Cholecystectomy    . Eye surgery      History  Substance Use Topics  . Smoking status: Never Smoker   . Smokeless tobacco: Not on file  . Alcohol Use: No    Family History  Problem Relation Age of Onset  . Cancer Mother   . Diabetes Mother   . Heart disease Mother   . Heart attack  Father   . Stroke Father   . Parkinson's disease Brother   . Cancer Sister     Allergies  Allergen Reactions  . Codeine   . Erythromycin   . Penicillins     She has taken keflex many times without porblems    Medication list has been reviewed and updated.  Current Outpatient Prescriptions on File Prior to Visit  Medication Sig Dispense Refill  . allopurinol (ZYLOPRIM) 100 MG tablet Take 100 mg by mouth 2 (two) times daily.      Marland Kitchen aspirin EC 81 MG tablet Take 81 mg by mouth daily.      Marland Kitchen atenolol (TENORMIN) 25 MG tablet Take 50 mg by mouth 2 (two) times daily.       . brimonidine (ALPHAGAN) 0.2 % ophthalmic solution 3 (three) times daily.      . diazepam (VALIUM) 5 MG tablet Take 5 mg by mouth 2 (two) times daily.      . dorzolamide-timolol (COSOPT) 22.3-6.8 MG/ML ophthalmic solution 1 drop 2 (two) times daily.      . furosemide (LASIX) 40 MG tablet Take 40 mg by mouth 2 (two) times daily.      Marland Kitchen glipiZIDE (GLUCOTROL) 10 MG tablet Take 10 mg by mouth 2 (two) times daily  before a meal.      . insulin NPH-insulin regular (NOVOLIN 70/30) (70-30) 100 UNIT/ML injection Inject 64 Units into the skin 2 (two) times daily with a meal.      . Insulin Syringe-Needle U-100 30G X 1/2" 1 ML MISC by Does not apply route.      Marland Kitchen losartan (COZAAR) 50 MG tablet Take 50 mg by mouth daily.      . ranitidine (ZANTAC) 150 MG capsule Take 150 mg by mouth 2 (two) times daily.      . simvastatin (ZOCOR) 20 MG tablet Take 20 mg by mouth every evening.      . sitaGLIPtin (JANUVIA) 100 MG tablet Take 100 mg by mouth daily.      . cephALEXin (KEFLEX) 250 MG capsule Take 1 capsule (250 mg total) by mouth 2 (two) times daily.  20 capsule  0   No current facility-administered medications on file prior to visit.    Review of Systems:  As per HPI, otherwise negative.    Physical Examination: Filed Vitals:   04/02/14 0823  BP: 124/84  Pulse: 70  Temp: 98.1 F (36.7 C)  Resp: 16   Filed Vitals:    04/02/14 0823  Height: 5' 3.5" (1.613 m)  Weight: 233 lb (105.688 kg)   Body mass index is 40.62 kg/(m^2). Ideal Body Weight: Weight in (lb) to have BMI = 25: 143.1   GEN: WDWN, NAD, Non-toxic, Alert & Oriented x 3 HEENT: Atraumatic, Normocephalic.  Ears and Nose: No external deformity. EXTR: No clubbing/cyanosis/edema NEURO: Normal gait.  PSYCH: Normally interactive. Conversant. Not depressed or anxious appearing.  Calm demeanor.    Assessment and Plan: Recurrent urinary tract infection cipro Follow up with urology and FMD  Signed,  Ellison Carwin, MD   Results for orders placed in visit on 04/02/14  POCT UA - MICROSCOPIC ONLY      Result Value Ref Range   WBC, Ur, HPF, POC tntc     RBC, urine, microscopic tntc     Bacteria, U Microscopic 3+     Mucus, UA pos     Epithelial cells, urine per micros 1-3     Crystals, Ur, HPF, POC wbc     Casts, Ur, LPF, POC neg     Yeast, UA neg    POCT URINALYSIS DIPSTICK      Result Value Ref Range   Color, UA yellow     Clarity, UA cloudy     Glucose, UA neg     Bilirubin, UA neg     Ketones, UA neg     Spec Grav, UA 1.025     Blood, UA mod     pH, UA 5.5     Protein, UA 300     Urobilinogen, UA 0.2     Nitrite, UA neg     Leukocytes, UA moderate (2+)    POCT CBC      Result Value Ref Range   WBC 11.6 (*) 4.6 - 10.2 K/uL   Lymph, poc 3.2  0.6 - 3.4   POC LYMPH PERCENT 27.9  10 - 50 %L   MID (cbc) 0.8  0 - 0.9   POC MID % 7.3  0 - 12 %M   POC Granulocyte 7.5 (*) 2 - 6.9   Granulocyte percent 64.8  37 - 80 %G   RBC 3.99 (*) 4.04 - 5.48 M/uL   Hemoglobin 11.4 (*) 12.2 - 16.2 g/dL   HCT, POC 38.1  37.7 -  47.9 %   MCV 95.6  80 - 97 fL   MCH, POC 28.6  27 - 31.2 pg   MCHC 29.9 (*) 31.8 - 35.4 g/dL   RDW, POC 18.6     Platelet Count, POC 291  142 - 424 K/uL   MPV 9.8  0 - 99.8 fL

## 2014-04-03 NOTE — Telephone Encounter (Signed)
Spoke to pt, states she keeps it and takes it when she needs it. And Dr Kenton Kingfisher will call it in for her.  She understands the contraindication.

## 2014-04-04 ENCOUNTER — Telehealth: Payer: Self-pay

## 2014-04-04 LAB — URINE CULTURE: Colony Count: >=100000

## 2014-04-04 NOTE — Telephone Encounter (Signed)
Notified pt of lab results.  She has been taking the Cipro as prescribed.

## 2014-04-04 NOTE — Telephone Encounter (Signed)
PT STATES SHE RECEIVED A CALL AND DIDN'T KNOW IF IT WAS ABOUT LABS SINCE SHE DID HAVE A URINE TEST PLEASE CALL [phone]

## 2014-04-06 ENCOUNTER — Telehealth: Payer: Self-pay

## 2014-04-06 NOTE — Telephone Encounter (Signed)
Patient called stated she need her records from 04/02/14,03/16/14,01/16/14 to be faxed over to Dr. Azalia Bilis at [facility] on [address] . Fax number [phone]. Patient contact number [phone]

## 2014-04-07 ENCOUNTER — Telehealth: Payer: Self-pay

## 2014-04-07 NOTE — Telephone Encounter (Signed)
Patient has some questions about filling out a release of information in order to get her records sent to [facility]. Patient has no mode of transportation to come in a complete release  [phone]

## 2014-04-08 NOTE — Telephone Encounter (Signed)
COMPLETED ON Friday

## 2014-09-12 DIAGNOSIS — N183 Chronic kidney disease, stage 3 unspecified: Secondary | ICD-10-CM | POA: Diagnosis not present

## 2014-09-12 DIAGNOSIS — E785 Hyperlipidemia, unspecified: Secondary | ICD-10-CM | POA: Diagnosis not present

## 2014-09-12 DIAGNOSIS — L989 Disorder of the skin and subcutaneous tissue, unspecified: Secondary | ICD-10-CM | POA: Diagnosis not present

## 2014-09-12 DIAGNOSIS — E1129 Type 2 diabetes mellitus with other diabetic kidney complication: Secondary | ICD-10-CM | POA: Diagnosis not present

## 2014-09-12 DIAGNOSIS — Z23 Encounter for immunization: Secondary | ICD-10-CM | POA: Diagnosis not present

## 2014-09-12 DIAGNOSIS — Z1331 Encounter for screening for depression: Secondary | ICD-10-CM | POA: Diagnosis not present

## 2014-09-12 DIAGNOSIS — E1149 Type 2 diabetes mellitus with other diabetic neurological complication: Secondary | ICD-10-CM | POA: Diagnosis not present

## 2014-09-12 DIAGNOSIS — I10 Essential (primary) hypertension: Secondary | ICD-10-CM | POA: Diagnosis not present

## 2014-09-12 DIAGNOSIS — E1142 Type 2 diabetes mellitus with diabetic polyneuropathy: Secondary | ICD-10-CM | POA: Diagnosis not present

## 2014-09-13 DIAGNOSIS — Z23 Encounter for immunization: Secondary | ICD-10-CM | POA: Diagnosis not present

## 2014-09-13 DIAGNOSIS — N184 Chronic kidney disease, stage 4 (severe): Secondary | ICD-10-CM | POA: Diagnosis not present

## 2014-09-14 DIAGNOSIS — E11319 Type 2 diabetes mellitus with unspecified diabetic retinopathy without macular edema: Secondary | ICD-10-CM | POA: Diagnosis not present

## 2014-09-14 DIAGNOSIS — E119 Type 2 diabetes mellitus without complications: Secondary | ICD-10-CM | POA: Diagnosis not present

## 2014-10-16 ENCOUNTER — Telehealth: Payer: Self-pay | Admitting: Internal Medicine

## 2014-10-16 NOTE — Telephone Encounter (Signed)
Spoke with the patient. She reports an episode of urgent explosive diarrhea and incontinence of the stool. Describes the color as black. As the patient talks, she sounds a little out of breath but she is able to carry the sentence to the end. Reports she feels tired and does not have any appetite. Advised an appointment but she states she does not have transportation and she does not know when she will. Her son works until 10 pm today and she is unsure of his schedule tomorrow. She has not contacted her primary Dr Kenton Kingfisher of [facility] [place]. The patient is hard to keep on subject and frequently wanders to an unrelated topic. With the patient's permission, I called to her PCP and left a message for the assistant to Dr Kenton Kingfisher with a brief explanation. Patient agrees to wait for a call from PCP or have her son take her to an urgent care.

## 2014-10-17 ENCOUNTER — Encounter (HOSPITAL_COMMUNITY): Payer: Self-pay | Admitting: Emergency Medicine

## 2014-10-17 ENCOUNTER — Inpatient Hospital Stay (HOSPITAL_COMMUNITY): Payer: Medicare Other

## 2014-10-17 ENCOUNTER — Inpatient Hospital Stay (HOSPITAL_COMMUNITY)
Admission: EM | Admit: 2014-10-17 | Discharge: 2014-11-02 | DRG: 291 | Disposition: A | Discharge status: Other Acute Inpatient Hospital | Payer: Medicare Other | Attending: Internal Medicine | Admitting: Internal Medicine

## 2014-10-17 DIAGNOSIS — E1069 Type 1 diabetes mellitus with other specified complication: Secondary | ICD-10-CM | POA: Diagnosis not present

## 2014-10-17 DIAGNOSIS — K921 Melena: Secondary | ICD-10-CM | POA: Diagnosis present

## 2014-10-17 DIAGNOSIS — N170 Acute kidney failure with tubular necrosis: Secondary | ICD-10-CM | POA: Diagnosis not present

## 2014-10-17 DIAGNOSIS — R339 Retention of urine, unspecified: Secondary | ICD-10-CM

## 2014-10-17 DIAGNOSIS — I130 Hypertensive heart and chronic kidney disease with heart failure and stage 1 through stage 4 chronic kidney disease, or unspecified chronic kidney disease: Secondary | ICD-10-CM | POA: Diagnosis not present

## 2014-10-17 DIAGNOSIS — D6489 Other specified anemias: Secondary | ICD-10-CM | POA: Diagnosis not present

## 2014-10-17 DIAGNOSIS — D649 Anemia, unspecified: Secondary | ICD-10-CM

## 2014-10-17 DIAGNOSIS — Z885 Allergy status to narcotic agent status: Secondary | ICD-10-CM | POA: Diagnosis not present

## 2014-10-17 DIAGNOSIS — G629 Polyneuropathy, unspecified: Secondary | ICD-10-CM | POA: Diagnosis present

## 2014-10-17 DIAGNOSIS — N19 Unspecified kidney failure: Secondary | ICD-10-CM | POA: Diagnosis not present

## 2014-10-17 DIAGNOSIS — I517 Cardiomegaly: Secondary | ICD-10-CM | POA: Diagnosis not present

## 2014-10-17 DIAGNOSIS — R0602 Shortness of breath: Secondary | ICD-10-CM

## 2014-10-17 DIAGNOSIS — R5381 Other malaise: Secondary | ICD-10-CM | POA: Diagnosis not present

## 2014-10-17 DIAGNOSIS — R609 Edema, unspecified: Secondary | ICD-10-CM | POA: Diagnosis not present

## 2014-10-17 DIAGNOSIS — Z91010 Allergy to peanuts: Secondary | ICD-10-CM

## 2014-10-17 DIAGNOSIS — K2990 Gastroduodenitis, unspecified, without bleeding: Secondary | ICD-10-CM | POA: Diagnosis not present

## 2014-10-17 DIAGNOSIS — Z7982 Long term (current) use of aspirin: Secondary | ICD-10-CM | POA: Diagnosis not present

## 2014-10-17 DIAGNOSIS — I5033 Acute on chronic diastolic (congestive) heart failure: Secondary | ICD-10-CM

## 2014-10-17 DIAGNOSIS — R630 Anorexia: Secondary | ICD-10-CM | POA: Diagnosis present

## 2014-10-17 DIAGNOSIS — R531 Weakness: Secondary | ICD-10-CM | POA: Diagnosis not present

## 2014-10-17 DIAGNOSIS — M109 Gout, unspecified: Secondary | ICD-10-CM | POA: Diagnosis present

## 2014-10-17 DIAGNOSIS — E1165 Type 2 diabetes mellitus with hyperglycemia: Secondary | ICD-10-CM | POA: Diagnosis present

## 2014-10-17 DIAGNOSIS — N390 Urinary tract infection, site not specified: Secondary | ICD-10-CM | POA: Diagnosis present

## 2014-10-17 DIAGNOSIS — G9340 Encephalopathy, unspecified: Secondary | ICD-10-CM | POA: Diagnosis not present

## 2014-10-17 DIAGNOSIS — E1121 Type 2 diabetes mellitus with diabetic nephropathy: Secondary | ICD-10-CM | POA: Diagnosis present

## 2014-10-17 DIAGNOSIS — Z794 Long term (current) use of insulin: Secondary | ICD-10-CM | POA: Diagnosis not present

## 2014-10-17 DIAGNOSIS — K2270 Barrett's esophagus without dysplasia: Secondary | ICD-10-CM | POA: Diagnosis not present

## 2014-10-17 DIAGNOSIS — I509 Heart failure, unspecified: Secondary | ICD-10-CM

## 2014-10-17 DIAGNOSIS — E1129 Type 2 diabetes mellitus with other diabetic kidney complication: Secondary | ICD-10-CM

## 2014-10-17 DIAGNOSIS — K210 Gastro-esophageal reflux disease with esophagitis: Secondary | ICD-10-CM | POA: Diagnosis present

## 2014-10-17 DIAGNOSIS — D62 Acute posthemorrhagic anemia: Secondary | ICD-10-CM | POA: Diagnosis not present

## 2014-10-17 DIAGNOSIS — IMO0002 Reserved for concepts with insufficient information to code with codable children: Secondary | ICD-10-CM

## 2014-10-17 DIAGNOSIS — D631 Anemia in chronic kidney disease: Secondary | ICD-10-CM | POA: Diagnosis present

## 2014-10-17 DIAGNOSIS — D509 Iron deficiency anemia, unspecified: Secondary | ICD-10-CM

## 2014-10-17 DIAGNOSIS — J811 Chronic pulmonary edema: Secondary | ICD-10-CM | POA: Diagnosis not present

## 2014-10-17 DIAGNOSIS — Z79899 Other long term (current) drug therapy: Secondary | ICD-10-CM | POA: Diagnosis not present

## 2014-10-17 DIAGNOSIS — K769 Liver disease, unspecified: Secondary | ICD-10-CM | POA: Diagnosis present

## 2014-10-17 DIAGNOSIS — N183 Chronic kidney disease, stage 3 unspecified: Secondary | ICD-10-CM

## 2014-10-17 DIAGNOSIS — F419 Anxiety disorder, unspecified: Secondary | ICD-10-CM | POA: Diagnosis present

## 2014-10-17 DIAGNOSIS — Z888 Allergy status to other drugs, medicaments and biological substances status: Secondary | ICD-10-CM | POA: Diagnosis not present

## 2014-10-17 DIAGNOSIS — K2950 Unspecified chronic gastritis without bleeding: Secondary | ICD-10-CM | POA: Diagnosis not present

## 2014-10-17 DIAGNOSIS — M1990 Unspecified osteoarthritis, unspecified site: Secondary | ICD-10-CM | POA: Diagnosis present

## 2014-10-17 DIAGNOSIS — K209 Esophagitis, unspecified without bleeding: Secondary | ICD-10-CM

## 2014-10-17 DIAGNOSIS — R195 Other fecal abnormalities: Secondary | ICD-10-CM

## 2014-10-17 DIAGNOSIS — N179 Acute kidney failure, unspecified: Secondary | ICD-10-CM | POA: Diagnosis not present

## 2014-10-17 DIAGNOSIS — E785 Hyperlipidemia, unspecified: Secondary | ICD-10-CM

## 2014-10-17 DIAGNOSIS — K2970 Gastritis, unspecified, without bleeding: Secondary | ICD-10-CM | POA: Diagnosis not present

## 2014-10-17 DIAGNOSIS — I5031 Acute diastolic (congestive) heart failure: Secondary | ICD-10-CM

## 2014-10-17 DIAGNOSIS — J90 Pleural effusion, not elsewhere classified: Secondary | ICD-10-CM | POA: Diagnosis not present

## 2014-10-17 DIAGNOSIS — Z6841 Body Mass Index (BMI) 40.0 and over, adult: Secondary | ICD-10-CM | POA: Diagnosis not present

## 2014-10-17 DIAGNOSIS — K5900 Constipation, unspecified: Secondary | ICD-10-CM | POA: Diagnosis not present

## 2014-10-17 DIAGNOSIS — R404 Transient alteration of awareness: Secondary | ICD-10-CM | POA: Diagnosis not present

## 2014-10-17 DIAGNOSIS — I10 Essential (primary) hypertension: Secondary | ICD-10-CM | POA: Diagnosis not present

## 2014-10-17 DIAGNOSIS — R0600 Dyspnea, unspecified: Secondary | ICD-10-CM | POA: Diagnosis not present

## 2014-10-17 DIAGNOSIS — E1065 Type 1 diabetes mellitus with hyperglycemia: Secondary | ICD-10-CM

## 2014-10-17 DIAGNOSIS — E108 Type 1 diabetes mellitus with unspecified complications: Secondary | ICD-10-CM

## 2014-10-17 DIAGNOSIS — K2980 Duodenitis without bleeding: Secondary | ICD-10-CM | POA: Diagnosis not present

## 2014-10-17 DIAGNOSIS — N184 Chronic kidney disease, stage 4 (severe): Secondary | ICD-10-CM | POA: Diagnosis not present

## 2014-10-17 DIAGNOSIS — N281 Cyst of kidney, acquired: Secondary | ICD-10-CM | POA: Diagnosis not present

## 2014-10-17 DIAGNOSIS — R600 Localized edema: Secondary | ICD-10-CM | POA: Diagnosis not present

## 2014-10-17 DIAGNOSIS — E8770 Fluid overload, unspecified: Secondary | ICD-10-CM | POA: Diagnosis not present

## 2014-10-17 HISTORY — DX: Malignant neoplasm of unspecified site of unspecified female breast: C50.919

## 2014-10-17 LAB — COMPREHENSIVE METABOLIC PANEL
ALBUMIN: 2.3 g/dL — AB (ref 3.5–5.2)
ALK PHOS: 79 U/L (ref 39–117)
ALT: 14 U/L (ref 0–35)
AST: 23 U/L (ref 0–37)
Anion gap: 15 (ref 5–15)
BUN: 70 mg/dL — ABNORMAL HIGH (ref 6–23)
CO2: 22 mEq/L (ref 19–32)
Calcium: 8.3 mg/dL — ABNORMAL LOW (ref 8.4–10.5)
Chloride: 108 mEq/L (ref 96–112)
Creatinine, Ser: 4.03 mg/dL — ABNORMAL HIGH (ref 0.50–1.10)
GFR calc Af Amer: 11 mL/min — ABNORMAL LOW (ref >90)
GFR calc non Af Amer: 10 mL/min — ABNORMAL LOW (ref >90)
Glucose, Bld: 184 mg/dL — ABNORMAL HIGH (ref 70–99)
POTASSIUM: 3.5 meq/L — AB (ref 3.7–5.3)
Sodium: 145 mEq/L (ref 137–147)
TOTAL PROTEIN: 6.2 g/dL (ref 6.0–8.3)
Total Bilirubin: <0.2 mg/dL — ABNORMAL LOW (ref 0.3–1.2)

## 2014-10-17 LAB — PROTIME-INR
INR: 1.14 (ref 0.00–1.49)
PROTHROMBIN TIME: 14.7 s (ref 11.6–15.2)

## 2014-10-17 LAB — CBC WITH DIFFERENTIAL/PLATELET
BASOS PCT: 0 % (ref 0–1)
Basophils Absolute: 0 10*3/uL (ref 0.0–0.1)
EOS ABS: 0.2 10*3/uL (ref 0.0–0.7)
EOS PCT: 2 % (ref 0–5)
HCT: 30.5 % — ABNORMAL LOW (ref 36.0–46.0)
Hemoglobin: 9.8 g/dL — ABNORMAL LOW (ref 12.0–15.0)
Lymphocytes Relative: 27 % (ref 12–46)
Lymphs Abs: 2.9 10*3/uL (ref 0.7–4.0)
MCH: 29.6 pg (ref 26.0–34.0)
MCHC: 32.1 g/dL (ref 30.0–36.0)
MCV: 92.1 fL (ref 78.0–100.0)
MONOS PCT: 6 % (ref 3–12)
Monocytes Absolute: 0.7 10*3/uL (ref 0.1–1.0)
NEUTROS PCT: 65 % (ref 43–77)
Neutro Abs: 7 10*3/uL (ref 1.7–7.7)
PLATELETS: 173 10*3/uL (ref 150–400)
RBC: 3.31 MIL/uL — ABNORMAL LOW (ref 3.87–5.11)
RDW: 17.5 % — ABNORMAL HIGH (ref 11.5–15.5)
WBC: 10.8 10*3/uL — ABNORMAL HIGH (ref 4.0–10.5)

## 2014-10-17 LAB — URINALYSIS, ROUTINE W REFLEX MICROSCOPIC
BILIRUBIN URINE: NEGATIVE
Glucose, UA: 100 mg/dL — AB
KETONES UR: NEGATIVE mg/dL
Leukocytes, UA: NEGATIVE
Nitrite: NEGATIVE
Protein, ur: >300 mg/dL — AB
Specific Gravity, Urine: 1.016 (ref 1.005–1.030)
UROBILINOGEN UA: 0.2 mg/dL (ref 0.0–1.0)
pH: 6 (ref 5.0–8.0)

## 2014-10-17 LAB — POC OCCULT BLOOD, ED: Fecal Occult Bld: POSITIVE — AB

## 2014-10-17 LAB — RETICULOCYTES
RBC.: 3.28 MIL/uL — ABNORMAL LOW (ref 3.87–5.11)
RETIC COUNT ABSOLUTE: 59 10*3/uL (ref 19.0–186.0)
RETIC CT PCT: 1.8 % (ref 0.4–3.1)

## 2014-10-17 LAB — GLUCOSE, CAPILLARY
Glucose-Capillary: 128 mg/dL — ABNORMAL HIGH (ref 70–99)
Glucose-Capillary: 173 mg/dL — ABNORMAL HIGH (ref 70–99)

## 2014-10-17 LAB — URINE MICROSCOPIC-ADD ON

## 2014-10-17 LAB — TROPONIN I: Troponin I: <0.3 ng/mL (ref <0.30)

## 2014-10-17 MED ORDER — DORZOLAMIDE HCL-TIMOLOL MAL 2-0.5 % OP SOLN
1.0000 [drp] | Freq: Two times a day (BID) | OPHTHALMIC | Status: DC
  Administered 2014-10-17 – 2014-10-18 (×2): 1 [drp] via OPHTHALMIC
  Filled 2014-10-17 (×3): qty 10

## 2014-10-17 MED ORDER — HYDRALAZINE HCL 25 MG PO TABS
25.0000 mg | ORAL_TABLET | Freq: Three times a day (TID) | ORAL | Status: DC
  Administered 2014-10-17 – 2014-10-19 (×6): 25 mg via ORAL
  Filled 2014-10-17 (×8): qty 1

## 2014-10-17 MED ORDER — INSULIN ASPART 100 UNIT/ML ~~LOC~~ SOLN
0.0000 [IU] | Freq: Three times a day (TID) | SUBCUTANEOUS | Status: DC
  Administered 2014-10-17: 1 [IU] via SUBCUTANEOUS
  Administered 2014-10-18 (×3): 2 [IU] via SUBCUTANEOUS
  Administered 2014-10-19: 3 [IU] via SUBCUTANEOUS
  Administered 2014-10-19 (×2): 2 [IU] via SUBCUTANEOUS
  Administered 2014-10-20: 3 [IU] via SUBCUTANEOUS
  Administered 2014-10-20 (×2): 2 [IU] via SUBCUTANEOUS
  Administered 2014-10-21: 3 [IU] via SUBCUTANEOUS
  Administered 2014-10-21: 5 [IU] via SUBCUTANEOUS
  Administered 2014-10-21 – 2014-10-22 (×2): 3 [IU] via SUBCUTANEOUS
  Administered 2014-10-22: 5 [IU] via SUBCUTANEOUS
  Administered 2014-10-22: 3 [IU] via SUBCUTANEOUS
  Administered 2014-10-23: 5 [IU] via SUBCUTANEOUS
  Administered 2014-10-23: 3 [IU] via SUBCUTANEOUS
  Administered 2014-10-23 – 2014-10-24 (×2): 2 [IU] via SUBCUTANEOUS
  Administered 2014-10-24: 3 [IU] via SUBCUTANEOUS
  Administered 2014-10-24: 2 [IU] via SUBCUTANEOUS
  Administered 2014-10-25 (×2): 3 [IU] via SUBCUTANEOUS
  Administered 2014-10-25 – 2014-10-26 (×3): 2 [IU] via SUBCUTANEOUS
  Administered 2014-10-26: 3 [IU] via SUBCUTANEOUS
  Administered 2014-10-27: 2 [IU] via SUBCUTANEOUS
  Administered 2014-10-27: 3 [IU] via SUBCUTANEOUS
  Administered 2014-10-27: 5 [IU] via SUBCUTANEOUS
  Administered 2014-10-28 (×2): 2 [IU] via SUBCUTANEOUS
  Administered 2014-10-28: 5 [IU] via SUBCUTANEOUS
  Administered 2014-10-29: 3 [IU] via SUBCUTANEOUS
  Administered 2014-10-29: 2 [IU] via SUBCUTANEOUS
  Administered 2014-10-30: 1 [IU] via SUBCUTANEOUS
  Administered 2014-10-30: 2 [IU] via SUBCUTANEOUS
  Administered 2014-10-30: 3 [IU] via SUBCUTANEOUS
  Administered 2014-10-31 (×2): 2 [IU] via SUBCUTANEOUS
  Administered 2014-10-31: 3 [IU] via SUBCUTANEOUS
  Administered 2014-11-01: 0 [IU] via SUBCUTANEOUS
  Administered 2014-11-01: 1 [IU] via SUBCUTANEOUS
  Administered 2014-11-01: 3 [IU] via SUBCUTANEOUS
  Administered 2014-11-02: 2 [IU] via SUBCUTANEOUS
  Administered 2014-11-02: 3 [IU] via SUBCUTANEOUS

## 2014-10-17 MED ORDER — PANTOPRAZOLE SODIUM 40 MG IV SOLR
40.0000 mg | Freq: Two times a day (BID) | INTRAVENOUS | Status: DC
  Administered 2014-10-17 – 2014-10-20 (×6): 40 mg via INTRAVENOUS
  Filled 2014-10-17 (×7): qty 40

## 2014-10-17 MED ORDER — SODIUM CHLORIDE 0.9 % IV BOLUS (SEPSIS)
500.0000 mL | Freq: Once | INTRAVENOUS | Status: AC
Start: 2014-10-17 — End: 2014-10-17
  Administered 2014-10-17: 500 mL via INTRAVENOUS

## 2014-10-17 MED ORDER — ACETAMINOPHEN 325 MG PO TABS
650.0000 mg | ORAL_TABLET | ORAL | Status: DC | PRN
Start: 2014-10-17 — End: 2014-11-02
  Administered 2014-10-18 – 2014-10-27 (×7): 650 mg via ORAL
  Filled 2014-10-17 (×7): qty 2

## 2014-10-17 MED ORDER — HYDRALAZINE HCL 20 MG/ML IJ SOLN: 10.0000 mg | Freq: Four times a day (QID) | INTRAMUSCULAR | Status: DC | PRN

## 2014-10-17 MED ORDER — SODIUM CHLORIDE 0.9 % IV SOLN
INTRAVENOUS | Status: DC
  Administered 2014-10-17: 11:00:00 via INTRAVENOUS

## 2014-10-17 MED ORDER — SIMVASTATIN 20 MG PO TABS
20.0000 mg | ORAL_TABLET | Freq: Every evening | ORAL | Status: DC
  Administered 2014-10-17 – 2014-11-01 (×16): 20 mg via ORAL
  Filled 2014-10-17 (×17): qty 1

## 2014-10-17 MED ORDER — FUROSEMIDE 10 MG/ML IJ SOLN
80.0000 mg | Freq: Three times a day (TID) | INTRAMUSCULAR | Status: DC
  Administered 2014-10-17 – 2014-10-22 (×14): 80 mg via INTRAVENOUS
  Filled 2014-10-17 (×20): qty 8

## 2014-10-17 MED ORDER — DIAZEPAM 5 MG PO TABS: 5.0000 mg | ORAL_TABLET | Freq: Four times a day (QID) | ORAL | Status: DC | PRN

## 2014-10-17 MED ORDER — POTASSIUM CHLORIDE CRYS ER 20 MEQ PO TBCR
20.0000 meq | EXTENDED_RELEASE_TABLET | Freq: Every day | ORAL | Status: DC
  Administered 2014-10-18 – 2014-11-02 (×15): 20 meq via ORAL
  Filled 2014-10-17 (×21): qty 1

## 2014-10-17 MED ORDER — ASPIRIN EC 81 MG PO TBEC
81.0000 mg | DELAYED_RELEASE_TABLET | Freq: Every day | ORAL | Status: DC
  Administered 2014-10-17 – 2014-11-02 (×16): 81 mg via ORAL
  Filled 2014-10-17 (×17): qty 1

## 2014-10-17 MED ORDER — ATENOLOL 50 MG PO TABS
50.0000 mg | ORAL_TABLET | Freq: Every day | ORAL | Status: DC
  Administered 2014-10-17 – 2014-11-02 (×15): 50 mg via ORAL
  Filled 2014-10-17 (×17): qty 1

## 2014-10-17 MED ORDER — SODIUM CHLORIDE 0.9 % IV SOLN: 250.0000 mL | INTRAVENOUS | Status: DC | PRN

## 2014-10-17 MED ORDER — FAMOTIDINE 10 MG PO TABS
10.0000 mg | ORAL_TABLET | Freq: Every day | ORAL | Status: DC
  Administered 2014-10-17 – 2014-10-22 (×6): 10 mg via ORAL
  Filled 2014-10-17 (×7): qty 1

## 2014-10-17 MED ORDER — SODIUM CHLORIDE 0.9 % IJ SOLN
3.0000 mL | Freq: Two times a day (BID) | INTRAMUSCULAR | Status: DC
  Administered 2014-10-17 – 2014-10-19 (×4): 3 mL via INTRAVENOUS

## 2014-10-17 MED ORDER — INSULIN GLARGINE 100 UNIT/ML ~~LOC~~ SOLN
10.0000 [IU] | Freq: Every day | SUBCUTANEOUS | Status: DC
  Administered 2014-10-17 – 2014-10-18 (×2): 10 [IU] via SUBCUTANEOUS
  Filled 2014-10-17 (×3): qty 0.1

## 2014-10-17 MED ORDER — PANTOPRAZOLE SODIUM 40 MG PO TBEC: 40.0000 mg | DELAYED_RELEASE_TABLET | Freq: Every day | ORAL | Status: DC

## 2014-10-17 MED ORDER — SODIUM CHLORIDE 0.9 % IV SOLN: INTRAVENOUS | Status: DC

## 2014-10-17 MED ORDER — HEPARIN SODIUM (PORCINE) 5000 UNIT/ML IJ SOLN: 5000.0000 [IU] | Freq: Three times a day (TID) | INTRAMUSCULAR | Status: DC

## 2014-10-17 MED ORDER — SODIUM CHLORIDE 0.9 % IJ SOLN: 3.0000 mL | INTRAMUSCULAR | Status: DC | PRN

## 2014-10-17 MED ORDER — ONDANSETRON HCL 4 MG/2ML IJ SOLN
4.0000 mg | Freq: Four times a day (QID) | INTRAMUSCULAR | Status: DC | PRN
  Administered 2014-11-02: 4 mg via INTRAVENOUS
  Filled 2014-10-17: qty 2

## 2014-10-17 MED ORDER — BRIMONIDINE TARTRATE 0.2 % OP SOLN
1.0000 [drp] | Freq: Three times a day (TID) | OPHTHALMIC | Status: DC
  Filled 2014-10-17 (×3): qty 5

## 2014-10-17 NOTE — ED Notes (Signed)
Patient has redness to the bottom. Patient denies any pain with the redness.

## 2014-10-17 NOTE — ED Notes (Signed)
Pt aware of need for urine sample.  

## 2014-10-17 NOTE — ED Notes (Signed)
Patient had 200 cc

## 2014-10-17 NOTE — ED Notes (Signed)
Patient states she has a history of leg weakness and has worsen over the last few days. Patient states she been having dark stool x2 for the last two days. HX of hypertension CBG 219

## 2014-10-17 NOTE — ED Provider Notes (Signed)
CSN: [id number]     Arrival date & time 10/17/14  1005 History   First MD Initiated Contact with Patient 10/17/14 1006     Chief Complaint  Patient presents with  . Extremity Weakness     (Consider location/radiation/quality/duration/timing/severity/associated sxs/prior Treatment) HPI   [name] James is a 78 y.o. female who is here for leg weakness. It started 2 days ago. She also c/o trouble breathing, and dark stool with diarrhea. No chest pain. She has dizziness upon standing. She has CRI and is followed by [facility]. She saw her PCP 1 week ago. Today she was unable to walk, using her walker, as usual.  Past Medical History  Diagnosis Date  . CHF (congestive heart failure) 05/13/2011    echo - [id number]; stage 1 diastolic dysfunction; elevated LV filling pressure, MAC  . Chest pain, atypical 12/05/2008    R/Lmv- normal perfusion all regions, noe ECG changes   . Renal insufficiency 10/30/2005    doppler - abn resistance consistent w/ parenchymal disease  . Claudication 10/30/2005    doppler - normal evaluation, no evidence of aneurysm, diameter reduction, dissection, compression or vascular abnormality)  . DM (diabetes mellitus)   . Peripheral neuropathy   . Hypertension   . Edema   . Gout   . Allergy   . Anxiety   . Arthritis   . Cataract   . GERD (gastroesophageal reflux disease)    Past Surgical History  Procedure Laterality Date  . Mastectomy Bilateral   . Back surgery    . Hemorrhoid surgery    . Breast surgery    . Cholecystectomy    . Eye surgery     Family History  Problem Relation Age of Onset  . Cancer Mother   . Diabetes Mother   . Heart disease Mother   . Heart attack Father   . Stroke Father   . Parkinson's disease Brother   . Cancer Sister    History  Substance Use Topics  . Smoking status: Never Smoker   . Smokeless tobacco: Not on file  . Alcohol Use: No   OB History   Grav Para Term Preterm Abortions TAB SAB Ect Mult Living                  Review of Systems  All other systems reviewed and are negative.     Allergies  Erythromycin; Penicillins; and Codeine  Home Medications   Prior to Admission medications   Medication Sig Start Date End Date Taking? Authorizing Provider  allopurinol (ZYLOPRIM) 100 MG tablet Take 100 mg by mouth 2 (two) times daily.   Yes Historical Provider, MD  aspirin EC 81 MG tablet Take 81 mg by mouth daily.   Yes Historical Provider, MD  atenolol (TENORMIN) 50 MG tablet Take 50 mg by mouth daily.   Yes Historical Provider, MD  brimonidine (ALPHAGAN) 0.2 % ophthalmic solution 3 (three) times daily.   Yes Historical Provider, MD  diazepam (VALIUM) 5 MG tablet Take 5 mg by mouth every 6 (six) hours as needed for anxiety.    Yes Historical Provider, MD  dorzolamide-timolol (COSOPT) 22.3-6.8 MG/ML ophthalmic solution 1 drop 2 (two) times daily.   Yes Historical Provider, MD  furosemide (LASIX) 40 MG tablet Take 40 mg by mouth 2 (two) times daily.   Yes Historical Provider, MD  glipiZIDE (GLUCOTROL) 10 MG tablet Take 10 mg by mouth 2 (two) times daily before a meal.   Yes Historical Provider, MD  insulin  NPH-insulin regular (NOVOLIN 70/30) (70-30) 100 UNIT/ML injection Inject 64-68 Units into the skin 2 (two) times daily with a meal.    Yes Historical Provider, MD  Insulin Syringe-Needle U-100 30G X 1/2" 1 ML MISC by Does not apply route.   Yes Historical Provider, MD  ranitidine (ZANTAC) 150 MG capsule Take 150 mg by mouth 2 (two) times daily.   Yes Historical Provider, MD  simvastatin (ZOCOR) 20 MG tablet Take 20 mg by mouth every evening.   Yes Historical Provider, MD  sitaGLIPtin (JANUVIA) 100 MG tablet Take 100 mg by mouth daily.   Yes Historical Provider, MD   BP 197/80  Pulse 65  Temp(Src) 97.6 F (36.4 C) (Oral)  Resp 21  SpO2 98% Physical Exam  Nursing note and vitals reviewed. Constitutional: She is oriented to person, place, and time. She appears well-developed.  Obese, Deconditioned   HENT:  Head: Normocephalic and atraumatic.  Right Ear: External ear normal.  Left Ear: External ear normal.  Eyes: Conjunctivae and EOM are normal. Pupils are equal, round, and reactive to light.  Neck: Normal range of motion and phonation normal. Neck supple.  Cardiovascular: Normal rate, regular rhythm and normal heart sounds.   Pulmonary/Chest: Effort normal and breath sounds normal. She exhibits no bony tenderness.  Abdominal: Soft. She exhibits no mass. There is no tenderness. There is no guarding.  Musculoskeletal: Normal range of motion. She exhibits edema (3+ bilateral legs).  Neurological: She is alert and oriented to person, place, and time. No cranial nerve deficit or sensory deficit. She exhibits normal muscle tone. Coordination normal.  Skin: Skin is warm, dry and intact.  Psychiatric: She has a normal mood and affect. Her behavior is normal. Judgment and thought content normal.    ED Course  Procedures (including critical care time)  Medications  0.9 %  sodium chloride infusion ( Intravenous New Bag/Given 10/17/14 1037)  0.9 %  sodium chloride infusion ( Intravenous Duplicate Q000111Q [id number])  sodium chloride 0.9 % bolus 500 mL (0 mLs Intravenous Stopped 10/17/14 1409)    Patient Vitals for the past 24 hrs:  BP Temp Temp src Pulse Resp SpO2  10/17/14 1400 197/80 mmHg - - 65 - 98 %  10/17/14 1330 181/64 mmHg - - 68 21 100 %  10/17/14 1300 181/79 mmHg - - 66 - 96 %  10/17/14 1230 186/62 mmHg - - 65 - 99 %  10/17/14 1200 185/63 mmHg - - 66 - 100 %  10/17/14 1145 181/79 mmHg - - 65 24 99 %  10/17/14 1130 180/65 mmHg - - 65 17 97 %  10/17/14 1100 195/60 mmHg - - 68 19 97 %  10/17/14 1050 180/56 mmHg - - 71 18 100 %  10/17/14 1015 180/56 mmHg - - 64 - 97 %  10/17/14 1010 191/56 mmHg 97.6 F (36.4 C) Oral 65 18 98 %    Bladder scan had 200 cc urine, in the bladder. Foley catheter placed.  3:56 PM-Consult complete with Dr. Tera Helper. Patient case explained and discussed.  He agrees to admit patient for further evaluation and treatment. Call ended at [place] Reviewed  CBC WITH DIFFERENTIAL - Abnormal; Notable for the following:    WBC 10.8 (*)    RBC 3.31 (*)    Hemoglobin 9.8 (*)    HCT 30.5 (*)    RDW 17.5 (*)    All other components within normal limits  COMPREHENSIVE METABOLIC PANEL - Abnormal; Notable for the following:  Potassium 3.5 (*)    Glucose, Bld 184 (*)    BUN 70 (*)    Creatinine, Ser 4.03 (*)    Calcium 8.3 (*)    Albumin 2.3 (*)    Total Bilirubin <0.2 (*)    GFR calc non Af Amer 10 (*)    GFR calc Af Amer 11 (*)    All other components within normal limits  URINALYSIS, ROUTINE W REFLEX MICROSCOPIC - Abnormal; Notable for the following:    Glucose, UA 100 (*)    Hgb urine dipstick MODERATE (*)    Protein, ur >300 (*)    All other components within normal limits  POC OCCULT BLOOD, ED - Abnormal; Notable for the following:    Fecal Occult Bld POSITIVE (*)    All other components within normal limits  URINE CULTURE  PROTIME-INR  URINE MICROSCOPIC-ADD ON    Imaging Review No results found.   EKG Interpretation None      MDM   Final diagnoses:  Anemia, unspecified anemia type  Acute kidney injury  Weakness  Urinary retention    Weakness with anemia, and  renal insufficiency, worsening. Doubt UTI. Patient lives with a family member in her own home. She is having problems walking. Stool is positive for occult blood. Stool is green in color. Patient will need to be admitted for stabilization.   Nursing Notes Reviewed/ Care Coordinated, and agree without changes. Applicable Imaging Reviewed.  Interpretation of Laboratory Data incorporated into ED treatment  Plan: Admit    Richarda Blade, MD 10/18/14 1023

## 2014-10-17 NOTE — Progress Notes (Signed)
Report received from [facility] in ED for patient to be admitted into 5w27

## 2014-10-17 NOTE — H&P (Addendum)
PATIENT DETAILS Name: [name] James Age: 78 y.o. Sex: female Date of Birth: 1936-05-30 Admit Date: 10/17/2014 [id number], Gwyndolyn Saxon, MD   CHIEF COMPLAINT:  Exertional dyspnea, worsening lower extremity swelling, weakness-2 weeks 2 episodes of black stools.  HPI: [name] James is a 78 y.o. female with a Past Medical History of chronic anemia disease stage III-4, diabetes, hypertension who presents today with the above noted complaint. Please note, patient is a very poor historian and is not able to clearly give history. Apparently approximately 2-3 weeks back, patient started developing weakness and worsening lower extremity edema. She also describes worsening exertional dyspnea, however is not able to clarify further. She normally is minimally ambulating with the help of a walker. She also claims that 2-3 times, she has had 1-2 loose stools but denies loose frequent watery bowel movements. She claims that yesterday and a few days ago she she had stools as well. She has not had a bowel movement today. Because of worsening of the above-noted symptoms, she presented to the emergency room for further evaluation and treatment,and was found to have significantly worsening of her renal function, I was asked to admit this patient for further evaluation and treatment. She denies any vomiting, but claims to have nausea. She claims to have significant loss of appetite over the past few weeks as well.  ALLERGIES:   Allergies  Allergen Reactions  . Erythromycin Swelling  . Penicillins     She has taken keflex many times without porblems  . Codeine Rash    PAST MEDICAL HISTORY: Past Medical History  Diagnosis Date  . CHF (congestive heart failure) 05/13/2011    echo - [id number]; stage 1 diastolic dysfunction; elevated LV filling pressure, MAC  . Chest pain, atypical 12/05/2008    R/Lmv- normal perfusion all regions, noe ECG changes   . Renal insufficiency 10/30/2005    doppler - abn  resistance consistent w/ parenchymal disease  . Claudication 10/30/2005    doppler - normal evaluation, no evidence of aneurysm, diameter reduction, dissection, compression or vascular abnormality)  . DM (diabetes mellitus)   . Peripheral neuropathy   . Hypertension   . Edema   . Gout   . Allergy   . Anxiety   . Arthritis   . Cataract   . GERD (gastroesophageal reflux disease)     PAST SURGICAL HISTORY: Past Surgical History  Procedure Laterality Date  . Mastectomy Bilateral   . Back surgery    . Hemorrhoid surgery    . Breast surgery    . Cholecystectomy    . Eye surgery      MEDICATIONS AT HOME: Prior to Admission medications   Medication Sig Start Date End Date Taking? Authorizing Provider  allopurinol (ZYLOPRIM) 100 MG tablet Take 100 mg by mouth 2 (two) times daily.   Yes Historical Provider, MD  aspirin EC 81 MG tablet Take 81 mg by mouth daily.   Yes Historical Provider, MD  atenolol (TENORMIN) 50 MG tablet Take 50 mg by mouth daily.   Yes Historical Provider, MD  brimonidine (ALPHAGAN) 0.2 % ophthalmic solution 3 (three) times daily.   Yes Historical Provider, MD  diazepam (VALIUM) 5 MG tablet Take 5 mg by mouth every 6 (six) hours as needed for anxiety.    Yes Historical Provider, MD  dorzolamide-timolol (COSOPT) 22.3-6.8 MG/ML ophthalmic solution 1 drop 2 (two) times daily.   Yes Historical Provider, MD  furosemide (LASIX) 40 MG tablet Take 40 mg  by mouth 2 (two) times daily.   Yes Historical Provider, MD  glipiZIDE (GLUCOTROL) 10 MG tablet Take 10 mg by mouth 2 (two) times daily before a meal.   Yes Historical Provider, MD  insulin NPH-insulin regular (NOVOLIN 70/30) (70-30) 100 UNIT/ML injection Inject 64-68 Units into the skin 2 (two) times daily with a meal.    Yes Historical Provider, MD  Insulin Syringe-Needle U-100 30G X 1/2" 1 ML MISC by Does not apply route.   Yes Historical Provider, MD  ranitidine (ZANTAC) 150 MG capsule Take 150 mg by mouth 2 (two) times  daily.   Yes Historical Provider, MD  simvastatin (ZOCOR) 20 MG tablet Take 20 mg by mouth every evening.   Yes Historical Provider, MD  sitaGLIPtin (JANUVIA) 100 MG tablet Take 100 mg by mouth daily.   Yes Historical Provider, MD    FAMILY HISTORY: Family History  Problem Relation Age of Onset  . Cancer Mother   . Diabetes Mother   . Heart disease Mother   . Heart attack Father   . Stroke Father   . Parkinson's disease Brother   . Cancer Sister     SOCIAL HISTORY:  reports that she has never smoked. She does not have any smokeless tobacco history on file. She reports that she does not drink alcohol. Her drug history is not on file.  REVIEW OF SYSTEMS:  Constitutional:   No  weight loss, night sweats,  Fevers.  HEENT:    No headaches, Difficulty swallowing,Tooth/dental problems,Sore throat,   Cardio-vascular: No chest pain,    dizziness, palpitations  GI:  No heartburn, indigestion, abdominal pain, vomiting, diarrhea  Resp:  No coughing up of blood.No change in color of mucus.No wheezing.No chest wall deformity  Skin:  no rash or lesions.  GU:  no dysuria, change in color of urine, no urgency or frequency.  No flank pain.  Musculoskeletal: No joint pain or swelling.  No decreased range of motion.  No back pain.  Psych: No change in mood or affect. No depression or anxiety.  No memory loss.   PHYSICAL EXAM: Blood pressure 192/69, pulse 66, temperature 97.7 F (36.5 C), temperature source Oral, resp. rate 25, SpO2 98.00%.  General appearance :Awake, alert, not in any distress. Speech Clear. Not toxic Looking HEENT: Atraumatic and Normocephalic, pupils equally reactive to light and accomodation Neck: supple, no JVD. No cervical lymphadenopathy.  Chest:Good air entry bilaterally, no added sounds  CVS: S1 S2 regular, no murmurs.  Abdomen: Bowel sounds present, Non tender and not distended with no gaurding, rigidity or rebound. Extremities: B/L Lower Ext shows 3+  edema, both legs are warm to touch Neurology: Awake alert, and oriented X 3, CN II-XII intact, Non focal Skin:No Rash Wounds:N/A  LABS ON ADMISSION:   Recent Labs  10/17/14 1030  NA 145  K 3.5*  CL 108  CO2 22  GLUCOSE 184*  BUN 70*  CREATININE 4.03*  CALCIUM 8.3*    Recent Labs  10/17/14 1030  AST 23  ALT 14  ALKPHOS 79  BILITOT <0.2*  PROT 6.2  ALBUMIN 2.3*   No results found for this basename: LIPASE, AMYLASE,  in the last 72 hours  Recent Labs  10/17/14 1030  WBC 10.8*  NEUTROABS 7.0  HGB 9.8*  HCT 30.5*  MCV 92.1  PLT 173   No results found for this basename: CKTOTAL, CKMB, CKMBINDEX, TROPONINI,  in the last 72 hours No results found for this basename: DDIMER,  in the  last 72 hours No components found with this basename: POCBNP,    RADIOLOGIC STUDIES ON ADMISSION: Dg Chest Port 1 View  10/17/2014   CLINICAL DATA:  Short of breath  EXAM: PORTABLE CHEST - 1 VIEW  COMPARISON:  08/24/2006  FINDINGS: Vascular congestion without pulmonary edema. Left basilar atelectasis. Low volumes. Normal heart size. No pneumothorax.  IMPRESSION: Vascular congestion without pulmonary edema.  Low volumes.   Electronically Signed   By: Maryclare Bean M.D.   On: 10/17/2014 17:31     EKG: Independently reviewed. NSR  ASSESSMENT AND PLAN: Present on Admission:  . Acute on chronic renal failure stage III: Clinically with volume overload, chest x-ray consistent with pulmonary edema. Stop IV fluids, start intravenous Lasix 80 mg 3 times a day. Foley catheter already in place, strict I&O's. Either she has acute diastolic heart failure or is developing cardiorenal syndrome. Suspect patient has diabetic nephropathy at baseline with stage III chronic disease. Check echocardiogram, renal ultrasound. Spoke with Dr. Lorrene Reid over the phone, if no improvement in her renal function or no significant diuresis, we need to consult nephrology in a.m.   . Suspected acute diastolic heart failure : We  will attempt to diurese with Lasix 80 mg IV 3 times a day. Obtain echocardiogram. If no significant diuresis/weight loss or improvement in kidney function, we need to consult nephrology. Await echocardiogram   . HTN (hypertension): Continue with atenolol, start hydralazine. Continue with IV Lasix. Monitor BP and continue to optimize blood pressure regimen. May need to add nitrates to further reduce afterload in the near future.   . CKD (chronic kidney disease), stage III: Suspected diabetic nephropathy with significant proteinuria. Now with worsening renal function, see above   . Type I diabetes mellitus with complication, uncontrolled: Place on SSI/10 units of Lantus  . Dyslipidemia: Continue statins  . Anemia: Suspect anemia of chronic kidney disease. Claims to have 2 black stools, per ED M.D. stool is green in color on his exam. Check anemia panel, FOBT stools.No over bleeding currently. Will check SPEP/UPEP. Hold off on aspirin and prophylactic heparin/Lovenox at this time. Follow. If Develops overt GI bleeding or has worsening anemia, may need a GI consultation.  Further plan will depend as patient's clinical course evolves and further radiologic and laboratory data become available. Patient will be monitored closely.   Above noted plan was discussed with patient/grandson, they were in agreement.   DVT Prophylaxis: SCD's  Code Status: Full Code  Total time spent for admission equals 45 minutes.  [facility] Pager [phone]  If 7PM-7AM, please contact night-coverage [email or web address] Password [email or web address] 10/17/2014, 5:52 PM

## 2014-10-17 NOTE — ED Notes (Signed)
Fluids stopped per Dr. Cheral Almas

## 2014-10-18 DIAGNOSIS — I517 Cardiomegaly: Secondary | ICD-10-CM

## 2014-10-18 DIAGNOSIS — R609 Edema, unspecified: Secondary | ICD-10-CM | POA: Diagnosis present

## 2014-10-18 LAB — RENAL FUNCTION PANEL
ALBUMIN: 2 g/dL — AB (ref 3.5–5.2)
Anion gap: 14 (ref 5–15)
BUN: 69 mg/dL — ABNORMAL HIGH (ref 6–23)
CO2: 20 mEq/L (ref 19–32)
Calcium: 8 mg/dL — ABNORMAL LOW (ref 8.4–10.5)
Chloride: 108 mEq/L (ref 96–112)
Creatinine, Ser: 3.97 mg/dL — ABNORMAL HIGH (ref 0.50–1.10)
GFR, EST AFRICAN AMERICAN: 12 mL/min — AB (ref >90)
GFR, EST NON AFRICAN AMERICAN: 10 mL/min — AB (ref >90)
Glucose, Bld: 170 mg/dL — ABNORMAL HIGH (ref 70–99)
PHOSPHORUS: 4.9 mg/dL — AB (ref 2.3–4.6)
Potassium: 3.3 mEq/L — ABNORMAL LOW (ref 3.7–5.3)
SODIUM: 142 meq/L (ref 137–147)

## 2014-10-18 LAB — CBC
HEMATOCRIT: 27.7 % — AB (ref 36.0–46.0)
Hemoglobin: 8.9 g/dL — ABNORMAL LOW (ref 12.0–15.0)
MCH: 29.1 pg (ref 26.0–34.0)
MCHC: 32.1 g/dL (ref 30.0–36.0)
MCV: 90.5 fL (ref 78.0–100.0)
Platelets: 176 10*3/uL (ref 150–400)
RBC: 3.06 MIL/uL — ABNORMAL LOW (ref 3.87–5.11)
RDW: 17.4 % — AB (ref 11.5–15.5)
WBC: 10.9 10*3/uL — ABNORMAL HIGH (ref 4.0–10.5)

## 2014-10-18 LAB — IRON AND TIBC
Iron: 106 ug/dL (ref 42–135)
Saturation Ratios: 37 % (ref 20–55)
TIBC: 285 ug/dL (ref 250–470)
UIBC: 179 ug/dL (ref 125–400)

## 2014-10-18 LAB — GLUCOSE, CAPILLARY
GLUCOSE-CAPILLARY: 175 mg/dL — AB (ref 70–99)
GLUCOSE-CAPILLARY: 161 mg/dL — AB (ref 70–99)
GLUCOSE-CAPILLARY: 160 mg/dL — AB (ref 70–99)
GLUCOSE-CAPILLARY: 174 mg/dL — AB (ref 70–99)

## 2014-10-18 LAB — TROPONIN I
Troponin I: <0.3 ng/mL (ref <0.30)
Troponin I: <0.3 ng/mL (ref <0.30)

## 2014-10-18 LAB — URINE CULTURE
Colony Count: NO GROWTH
Culture: NO GROWTH

## 2014-10-18 LAB — FOLATE: Folate: >20 ng/mL

## 2014-10-18 LAB — FERRITIN: FERRITIN: 41 ng/mL (ref 10–291)

## 2014-10-18 LAB — VITAMIN B12: Vitamin B-12: 442 pg/mL (ref 211–911)

## 2014-10-18 LAB — PRO B NATRIURETIC PEPTIDE: PRO B NATRI PEPTIDE: 3262 pg/mL — AB (ref 0–450)

## 2014-10-18 NOTE — Progress Notes (Signed)
PROGRESS NOTE  [name] James [id number] DOB: June 18, 1936 DOA: 10/17/2014 PCP: Shirline Frees, MD  HPI/Recap of past 70 hours: 78 year old female with past medical history of stage III chronic kidney disease and baseline creatinine of 3 who had stable labs 1 month prior resentment to the emergency room with several days of worsening shortness of breath and found to be in pulmonary edema with a creatinine of 4. Patient started on IV Lasix and case discussed with nephrology. The following day, patient had diuresed 1.5 L, with little change in her creatinine.  Assessment/Plan: Principal Problem:   Acute CHF (congestive heart failure): Continue aggressive Lasix, as long as patient responds to diuresis, stable Active Problems:   AKI (acute kidney injury) in the setting of chronic kidney disease stage III: Spoke with nephrology, continue diuresis and hopefully this will improve   HTN (hypertension): Stable    Type I diabetes mellitus with complication, uncontrolled: Continue sliding scale, CBGs staying under 200    Dyslipidemia: Stable continue statin   Anemia: Secondary to chronic liver disease. There were reports of dark stools earlier. Patient's baseline hemoglobin is around 10-11. Presented to the emergency room with a hemoglobin of 9 which is come down to 8 today. Continue to monitor   Morbid obesity: Patient meets criteria with BMI greater than 40.   Edema   Code Status: Full code  Family Communication: Son at the bedside, spoke with daughter by phone  Disposition Plan: Continue in-hospital until fully diuresed, renal function stabilized and confirmed no active bleeding   Consultants:  Discussed with nephrology  Procedures:  None  Antibiotics:  None   Objective: BP 169/53  Pulse 66  Temp(Src) 98.7 F (37.1 C) (Oral)  Resp 20  Ht 5\' 3"  (1.6 m)  Wt 113.3 kg (249 lb 12.5 oz)  BMI 44.26 kg/m2  SpO2 99%  Intake/Output Summary (Last 24 hours) at 10/18/14  1652 Last data filed at 10/18/14 1341  Gross per 24 hour  Intake    723 ml  Output   1650 ml  Net   -927 ml   Filed Weights   10/17/14 2203  Weight: 113.3 kg (249 lb 12.5 oz)    Exam:   General:  Alert and oriented 3, no acute distress  Cardiovascular: Regular rate and rhythm, Q000111Q, 2/6 systolic ejection murmur  Respiratory: Clear to auscultation bilaterally  Abdomen: Soft, obese, nontender, positive bowel sounds  Musculoskeletal: No clubbing or cyanosis, 2+ pitting edema from the knees down   Data Reviewed: Basic Metabolic Panel:  Recent Labs Lab 10/17/14 1030 10/18/14 0727  NA 145 142  K 3.5* 3.3*  CL 108 108  CO2 22 20  GLUCOSE 184* 170*  BUN 70* 69*  CREATININE 4.03* 3.97*  CALCIUM 8.3* 8.0*  PHOS  --  4.9*   Liver Function Tests:  Recent Labs Lab 10/17/14 1030 10/18/14 0727  AST 23  --   ALT 14  --   ALKPHOS 79  --   BILITOT <0.2*  --   PROT 6.2  --   ALBUMIN 2.3* 2.0*   No results found for this basename: LIPASE, AMYLASE,  in the last 168 hours No results found for this basename: AMMONIA,  in the last 168 hours CBC:  Recent Labs Lab 10/17/14 1030 10/18/14 0727  WBC 10.8* 10.9*  NEUTROABS 7.0  --   HGB 9.8* 8.9*  HCT 30.5* 27.7*  MCV 92.1 90.5  PLT 173 176   Cardiac Enzymes:    Recent Labs Lab  10/17/14 2256 10/18/14 0012 10/18/14 0727  TROPONINI <0.30 <0.30 <0.30   BNP (last 3 results)  Recent Labs  10/18/14 0727  PROBNP 3262.0*   CBG:  Recent Labs Lab 10/17/14 1905 10/17/14 2216 10/18/14 0808 10/18/14 1201  GLUCAP 128* 173* 175* 161*    No results found for this or any previous visit (from the past 240 hour(s)).   Studies: US Renal  10/17/2014   CLINICAL DATA:  Acute renal failure  EXAM: RENAL/URINARY TRACT ULTRASOUND COMPLETE  COMPARISON:  None.  FINDINGS: Right Kidney:  Length: 12.6 cm. Diffuse cortical thinning is noted. Increased echogenicity is noted as well. There are multiple cysts identified within  the right kidney. Largest of these measures 2.2 cm.  Left Kidney:  Length: 11.8 cm. Increased echogenicity and diffuse cortical thinning. Multiple cysts are identified. The largest of these measures 3.4 cm.  Bladder:  Decompressed by Foley catheter.  IMPRESSION: Increased echogenicity and cortical thinning consistent with medical renal disease.  Bilateral renal cysts.   Electronically Signed   By: Inez Catalina M.D.   On: 10/17/2014 20:42   Dg Chest Port 1 View  10/17/2014   CLINICAL DATA:  Short of breath  EXAM: PORTABLE CHEST - 1 VIEW  COMPARISON:  08/24/2006  FINDINGS: Vascular congestion without pulmonary edema. Left basilar atelectasis. Low volumes. Normal heart size. No pneumothorax.  IMPRESSION: Vascular congestion without pulmonary edema.  Low volumes.   Electronically Signed   By: Maryclare Bean M.D.   On: 10/17/2014 17:31    Scheduled Meds: . aspirin EC  81 mg Oral Daily  . atenolol  50 mg Oral Daily  . brimonidine  1 drop Both Eyes TID  . dorzolamide-timolol  1 drop Both Eyes BID  . famotidine  10 mg Oral Daily  . furosemide  80 mg Intravenous Q8H  . hydrALAZINE  25 mg Oral 3 times per day  . insulin aspart  0-9 Units Subcutaneous TID WC  . insulin glargine  10 Units Subcutaneous QHS  . pantoprazole (PROTONIX) IV  40 mg Intravenous Q12H  . potassium chloride  20 mEq Oral Daily  . simvastatin  20 mg Oral QPM  . sodium chloride  3 mL Intravenous Q12H    Continuous Infusions:    Time spent: 25 minutes  [facility] Pager [phone]. If 7PM-7AM, please contact night-coverage at [email or web address], password [facility] 10/18/2014, 4:52 PM  LOS: 1 day

## 2014-10-18 NOTE — Progress Notes (Signed)
Utilization review completed.  

## 2014-10-18 NOTE — Progress Notes (Signed)
Nutrition Brief Note  Patient identified on the Malnutrition Screening Tool (MST) Report.  Wt Readings from Last 15 Encounters:  10/17/14 249 lb 12.5 oz (113.3 kg)  04/02/14 233 lb (105.688 kg)  03/16/14 242 lb (109.77 kg)  01/16/14 241 lb (109.317 kg)    Body mass index is 44.26 kg/(m^2). Patient meets criteria for Obesity Class III based on current BMI.   Current diet order is Heart Healthy/Carbohydrate Modified, patient is consuming approximately 100% of meals at this time. Labs and medications reviewed.   No nutrition interventions warranted at this time. If nutrition issues arise, please consult RD.   Arthur Holms, RD, LDN Pager #: [phone] After-Hours Pager #: [phone]

## 2014-10-19 LAB — CBC
HCT: 26 % — ABNORMAL LOW (ref 36.0–46.0)
Hemoglobin: 8.5 g/dL — ABNORMAL LOW (ref 12.0–15.0)
MCH: 29.6 pg (ref 26.0–34.0)
MCHC: 32.7 g/dL (ref 30.0–36.0)
MCV: 90.6 fL (ref 78.0–100.0)
Platelets: 170 10*3/uL (ref 150–400)
RBC: 2.87 MIL/uL — ABNORMAL LOW (ref 3.87–5.11)
RDW: 17.5 % — ABNORMAL HIGH (ref 11.5–15.5)
WBC: 11.5 10*3/uL — AB (ref 4.0–10.5)

## 2014-10-19 LAB — BASIC METABOLIC PANEL
ANION GAP: 15 (ref 5–15)
BUN: 74 mg/dL — ABNORMAL HIGH (ref 6–23)
CHLORIDE: 108 meq/L (ref 96–112)
CO2: 20 mEq/L (ref 19–32)
Calcium: 8.1 mg/dL — ABNORMAL LOW (ref 8.4–10.5)
Creatinine, Ser: 4.03 mg/dL — ABNORMAL HIGH (ref 0.50–1.10)
GFR calc non Af Amer: 10 mL/min — ABNORMAL LOW (ref >90)
GFR, EST AFRICAN AMERICAN: 11 mL/min — AB (ref >90)
Glucose, Bld: 160 mg/dL — ABNORMAL HIGH (ref 70–99)
POTASSIUM: 3.4 meq/L — AB (ref 3.7–5.3)
Sodium: 143 mEq/L (ref 137–147)

## 2014-10-19 LAB — UIFE/LIGHT CHAINS/TP QN, 24-HR UR
ALBUMIN, U: DETECTED
ALPHA 1 UR: DETECTED — AB
ALPHA 2 UR: DETECTED — AB
Beta, Urine: DETECTED — AB
GAMMA UR: DETECTED — AB
Total Protein, Urine: 1016 mg/dL — ABNORMAL HIGH (ref 5–24)

## 2014-10-19 LAB — GLUCOSE, CAPILLARY
GLUCOSE-CAPILLARY: 163 mg/dL — AB (ref 70–99)
Glucose-Capillary: 177 mg/dL — ABNORMAL HIGH (ref 70–99)
Glucose-Capillary: 191 mg/dL — ABNORMAL HIGH (ref 70–99)
Glucose-Capillary: 147 mg/dL — ABNORMAL HIGH (ref 70–99)

## 2014-10-19 MED ORDER — INSULIN GLARGINE 100 UNIT/ML ~~LOC~~ SOLN
12.0000 [IU] | Freq: Every day | SUBCUTANEOUS | Status: DC
  Administered 2014-10-19 – 2014-10-20 (×2): 12 [IU] via SUBCUTANEOUS
  Filled 2014-10-19 (×3): qty 0.12

## 2014-10-19 MED ORDER — HYDRALAZINE HCL 50 MG PO TABS
50.0000 mg | ORAL_TABLET | Freq: Three times a day (TID) | ORAL | Status: DC
  Administered 2014-10-19 – 2014-10-28 (×22): 50 mg via ORAL
  Filled 2014-10-19 (×29): qty 1

## 2014-10-19 MED ORDER — POTASSIUM CHLORIDE CRYS ER 20 MEQ PO TBCR
20.0000 meq | EXTENDED_RELEASE_TABLET | Freq: Once | ORAL | Status: AC
  Administered 2014-10-19: 20 meq via ORAL

## 2014-10-19 MED ORDER — DORZOLAMIDE HCL-TIMOLOL MAL 2-0.5 % OP SOLN
1.0000 [drp] | Freq: Two times a day (BID) | OPHTHALMIC | Status: DC
  Administered 2014-10-19 – 2014-11-02 (×27): 1 [drp] via OPHTHALMIC
  Filled 2014-10-19: qty 10

## 2014-10-19 NOTE — Progress Notes (Signed)
PROGRESS NOTE  [name] James [id number] DOB: 1936/11/18 DOA: 10/17/2014 PCP: Shirline Frees, MD  HPI/Recap of past 56 hours: 78 year old female with past medical history of stage III chronic kidney disease and baseline creatinine of 3 who had stable labs 1 month prior resentment to the emergency room with several days of worsening shortness of breath and found to be in pulmonary edema with a creatinine of 4. Patient started on IV Lasix and case discussed with nephrology.   After 2 days, patient continues to do well and has diuresed over 3 L. Her breathing while still labored is getting better. Her creatinine has not changed much  Assessment/Plan: Principal Problem:   Acute diastolic CHF (congestive heart failure): Continue aggressive Lasix, as long as patient responds to diuresis, stable Active Problems:   AKI (acute kidney injury) in the setting of chronic kidney disease stage III:  This is cardiorenal syndrome. Spoke with nephrology, continue diuresis and hopefully this will improve   HTN (hypertension): Blood pressures at times still elevated in the [id number] systolic.  Hopefully will improve with diuresis. Overall pressures staying up, so will increase scheduled by mouth hydralazine    Type I diabetes mellitus with complication, uncontrolled: Continue sliding scale, CBGs overall under 200, but we'll tweak Lantus up to keep sugars a little better controlled    Dyslipidemia: Stable continue statin   Anemia: Secondary to chronic liver disease. There were reports of dark stools earlier. Patient's baseline hemoglobin is around 10-11. Presented to the emergency room with a hemoglobin of near 10 and has come down to 9. Not much change from yesterday. Continue to monitor   Morbid obesity: Patient meets criteria with BMI greater than 40.   Edema   Code Status: Full code  Family Communication: Son at the bedside, spoke with daughter by phone  Disposition Plan: Continue in-hospital until  fully diuresed, renal function stabilized and confirmed no active bleeding. We'll start to ambulate   Consultants:  Discussed with nephrology  Procedures:  None  Antibiotics:  None   Objective: BP 181/63  Pulse 65  Temp(Src) 97.8 F (36.6 C) (Oral)  Resp 18  Ht 5\' 3"  (1.6 m)  Wt 112.1 kg (247 lb 2.2 oz)  BMI 43.79 kg/m2  SpO2 98%  Intake/Output Summary (Last 24 hours) at 10/19/14 1452 Last data filed at 10/19/14 1333  Gross per 24 hour  Intake    720 ml  Output   2502 ml  Net  -1782 ml   Filed Weights   10/17/14 2203 10/19/14 0646  Weight: 113.3 kg (249 lb 12.5 oz) 112.1 kg (247 lb 2.2 oz)    Exam:   General:  Alert and oriented 3, mild labored breathing  Cardiovascular: Regular rate and rhythm, Q000111Q, 2/6 systolic ejection murmur  Respiratory: Clear to auscultation bilaterally  Abdomen: Soft, obese, nontender, positive bowel sounds  Musculoskeletal: No clubbing or cyanosis, 2+ pitting edema from the knees down   Data Reviewed: Basic Metabolic Panel:  Recent Labs Lab 10/17/14 1030 10/18/14 0727 10/19/14 0545  NA 145 142 143  K 3.5* 3.3* 3.4*  CL 108 108 108  CO2 22 20 20   GLUCOSE 184* 170* 160*  BUN 70* 69* 74*  CREATININE 4.03* 3.97* 4.03*  CALCIUM 8.3* 8.0* 8.1*  PHOS  --  4.9*  --    Liver Function Tests:  Recent Labs Lab 10/17/14 1030 10/18/14 0727  AST 23  --   ALT 14  --   ALKPHOS 79  --  BILITOT <0.2*  --   PROT 6.2  --   ALBUMIN 2.3* 2.0*   No results found for this basename: LIPASE, AMYLASE,  in the last 168 hours No results found for this basename: AMMONIA,  in the last 168 hours CBC:  Recent Labs Lab 10/17/14 1030 10/18/14 0727 10/19/14 0545  WBC 10.8* 10.9* 11.5*  NEUTROABS 7.0  --   --   HGB 9.8* 8.9* 8.5*  HCT 30.5* 27.7* 26.0*  MCV 92.1 90.5 90.6  PLT 173 176 170   Cardiac Enzymes:    Recent Labs Lab 10/17/14 2256 10/18/14 0012 10/18/14 0727  TROPONINI <0.30 <0.30 <0.30   BNP (last 3  results)  Recent Labs  10/18/14 0727  PROBNP 3262.0*   CBG:  Recent Labs Lab 10/18/14 1201 10/18/14 1700 10/18/14 2134 10/19/14 0757 10/19/14 1204  GLUCAP 161* 160* 174* 163* 177*    Recent Results (from the past 240 hour(s))  URINE CULTURE     Status: None   Collection Time    10/17/14  2:54 PM      Result Value Ref Range Status   Specimen Description URINE, CLEAN CATCH   Final   Special Requests NONE   Final   Culture  Setup Time     Final   Value: 10/17/2014 21:07     Performed at [place]     Final   Value: NO GROWTH     Performed at [facility]   Culture     Final   Value: NO GROWTH     Performed at [facility]   Report Status 10/18/2014 FINAL   Final     Studies: No results found.  Scheduled Meds: . aspirin EC  81 mg Oral Daily  . atenolol  50 mg Oral Daily  . dorzolamide-timolol  1 drop Left Eye BID  . famotidine  10 mg Oral Daily  . furosemide  80 mg Intravenous Q8H  . hydrALAZINE  25 mg Oral 3 times per day  . insulin aspart  0-9 Units Subcutaneous TID WC  . insulin glargine  12 Units Subcutaneous QHS  . pantoprazole (PROTONIX) IV  40 mg Intravenous Q12H  . potassium chloride  20 mEq Oral Daily  . simvastatin  20 mg Oral QPM    Continuous Infusions:    Time spent: 25 minutes  [facility] Pager [phone]. If 7PM-7AM, please contact night-coverage at [email or web address], password [facility] 10/19/2014, 2:52 PM  LOS: 2 days

## 2014-10-19 NOTE — Clinical Documentation Improvement (Signed)
Presents with Acute Diastolic CHF, CKD3. Hypertension is an assumed link with Chronic Kidney Disease; it is not an assumed link with Heart Disease.    "Either she has acute diastolic heart failure or is developing cardiorenal syndrome" documented in H&P.  ECHO reveals - mild LVH  BP's during stay have been as high as 192/69, 181/79  Being treated with IV Apresoline 10 mg Q6H if SBP is > 180.  Please clarify if you feel Cardio Renal Syndrome has: Ruled In                 [employer]                 Other Condition  . Thank You, Zoila Shutter ,RN Clinical Documentation Specialist:  [employer]

## 2014-10-19 NOTE — Progress Notes (Signed)
Pt is very confused and disoriented not not able to reorient she thinks she is at home and refused to allow me to give IV lasix. Arthor Captain LPN

## 2014-10-20 LAB — BASIC METABOLIC PANEL
Anion gap: 14 (ref 5–15)
BUN: 81 mg/dL — ABNORMAL HIGH (ref 6–23)
CHLORIDE: 111 meq/L (ref 96–112)
CO2: 20 mEq/L (ref 19–32)
Calcium: 7.9 mg/dL — ABNORMAL LOW (ref 8.4–10.5)
Creatinine, Ser: 4.28 mg/dL — ABNORMAL HIGH (ref 0.50–1.10)
GFR calc Af Amer: 11 mL/min — ABNORMAL LOW (ref >90)
GFR calc non Af Amer: 9 mL/min — ABNORMAL LOW (ref >90)
GLUCOSE: 172 mg/dL — AB (ref 70–99)
Potassium: 3.7 mEq/L (ref 3.7–5.3)
Sodium: 145 mEq/L (ref 137–147)

## 2014-10-20 LAB — GLUCOSE, CAPILLARY
GLUCOSE-CAPILLARY: 163 mg/dL — AB (ref 70–99)
GLUCOSE-CAPILLARY: 192 mg/dL — AB (ref 70–99)
GLUCOSE-CAPILLARY: 209 mg/dL — AB (ref 70–99)
Glucose-Capillary: 210 mg/dL — ABNORMAL HIGH (ref 70–99)

## 2014-10-20 MED ORDER — PANTOPRAZOLE SODIUM 40 MG PO TBEC
40.0000 mg | DELAYED_RELEASE_TABLET | Freq: Two times a day (BID) | ORAL | Status: DC
  Administered 2014-10-21 – 2014-10-24 (×7): 40 mg via ORAL
  Filled 2014-10-20 (×9): qty 1

## 2014-10-20 MED ORDER — ENOXAPARIN SODIUM 30 MG/0.3ML ~~LOC~~ SOLN
30.0000 mg | SUBCUTANEOUS | Status: DC
  Administered 2014-10-20 – 2014-11-01 (×13): 30 mg via SUBCUTANEOUS
  Filled 2014-10-20 (×14): qty 0.3

## 2014-10-20 NOTE — Care Management Note (Addendum)
    Page 1 of 2   11/02/2014     4:08:13 PM CARE MANAGEMENT NOTE 11/02/2014  Patient:  [name] James, [name] James   Account Number:  [id number]  Date Initiated:  10/20/2014  Documentation initiated by:  [name] James  Subjective/Objective Assessment:   dx aki  admit- lives with son.     Action/Plan:   pt rec snf.   Anticipated DC Date:  11/02/2014   Anticipated DC Plan:  LONG TERM ACUTE CARE (LTAC)  In-house referral  Clinical Social Worker      DC Planning Services  CM consult      PAC Choice  LONG TERM ACUTE CARE   Choice offered to / List presented to:  C-4 Adult Children           Status of service:  Completed, signed off Medicare Important Message given?  YES (If response is "NO", the following Medicare IM given date fields will be blank) Date Medicare IM given:  10/20/2014 Medicare IM given by:  [name] James Date Additional Medicare IM given:  11/02/2014 Additional Medicare IM given by:  [name] James  Discharge Disposition:  LONG TERM ACUTE CARE (LTAC)  Per UR Regulation:  Reviewed for med. necessity/level of care/duration of stay  If discussed at [place] of Stay Meetings, dates discussed:    Comments:  11/02/14 [place], BSN [phone] patient dc to [facility].  11/01/2014 1700 NCM was able to speak to pt and gave permission to speak to son, [name] James. Pt states she understands she does need to transfer to [facility] or go to SNF tomorrow. States her dtr will be here this evening and they will make final decision. Son states they fully understand and he was very appreciative of NCM/CSW assistance. He felt his sister may have misunderstood process. They are reconsidering [facility] placement. [name] James phone [phone]  11/01/2014 1039 NCM spoke to pt and requested speak to [name] James # [phone] about transfer to [facility]. NCM contacted dtr and she was upset that pt was being tranferred to [facility]. NCM explained to dtr that  pt and family has to sign consent for transfer to [facility]. States she did not like the facility and did not want her to transfer. Discussed other options such as [facility] or SNF. Explained that when pt was medically ready for transfer to another level of care that insurance would not cover for continued IP stay. States she is agreeable to SNF placement when medically ready. Explained NCM will make CSW aware and they will arrange SNF placement. CSW referral for SNF placement. Made attending aware dtr was did not want transfer to [facility]. Notified [facility] rep of family's decision. [name] James phone [phone]   10/31/14 [place], BSN  [phone] NCM spoke with patient ,her daughter and her son [name] James, they will come tomorrow to look at [facility], and [name] James states they will have a bed for patient tomorrow. NCM notiified CSW and MD of this information and Charge RN.  10/20/14 [place], BSN [phone] per physical therapy rec snf, NCM spoke with patient and she states she has been in [facility] before,  she states she agress to go to a facility for rehab to get stronger before going home.  NCM informed [name] James, the CSW.

## 2014-10-20 NOTE — Progress Notes (Signed)
PROGRESS NOTE  [name] James [id number] DOB: 1936/05/07 DOA: 10/17/2014 PCP: Shirline Frees, MD  HPI/Recap of past 23 hours: 78 year old female with past medical history of stage III chronic kidney disease and baseline creatinine of 3 who had stable labs 1 month prior resentment to the emergency room with several days of worsening shortness of breath and found to be in pulmonary edema with a creatinine of 4. Patient started on IV Lasix and case discussed with nephrology.   Patient continues to have significant diuresis and has now diuresed over 4-1/2 L and is down 6 pounds. Creatinine did increase up to 4.28 from 4. Patient herself is doing okay, complains of overall fatigue  Assessment/Plan: Principal Problem:   Acute diastolic CHF (congestive heart failure): Continue aggressive Lasix, as long as patient responds to diuresis, stable.Continue Lasix and follow renal function. If output decreases or renal function goes up significantly further, will discuss with nephrology Active Problems:   AKI (acute kidney injury) in the setting of chronic kidney disease stage III:  This is cardiorenal syndrome. Spoke with nephrology, continue diuresis and hopefully this will improve   HTN (hypertension): Blood pressure starting to trend down, responding well with diuresis plus hydralazine scheduled by mouth increased on 10/29    Type I diabetes mellitus with complication, uncontrolled: Continue sliding scale, CBGs overall under 200, but we'll tweak Lantus up to keep sugars a little better controlled    Dyslipidemia: Stable continue statin   Anemia: Secondary to chronic liver disease. There were reports of dark stools earlier. Patient's baseline hemoglobin is around 10-11. Presented to the emergency room with a hemoglobin of near 10 and has come down to 9. Not much change from yesterday. Continue to monitor   Morbid obesity: Patient meets criteria with BMI greater than 40.   Edema   Code Status:  Full code  Family Communication: Spoke with son by phone  Disposition Plan: Continue in-hospital until fully diuresed, renal function stabilized and confirmed no active bleeding. Seen by physical therapy recommending skilled nursing   Consultants:  Discussed with nephrology  Procedures:  None  Antibiotics:  None   Objective: BP 169/71  Pulse 64  Temp(Src) 97.7 F (36.5 C) (Oral)  Resp 18  Ht 5\' 3"  (1.6 m)  Wt 110.4 kg (243 lb 6.2 oz)  BMI 43.13 kg/m2  SpO2 100%  Intake/Output Summary (Last 24 hours) at 10/20/14 1431 Last data filed at 10/20/14 0900  Gross per 24 hour  Intake    120 ml  Output   1725 ml  Net  -1605 ml   Filed Weights   10/17/14 2203 10/19/14 0646 10/20/14 0500  Weight: 113.3 kg (249 lb 12.5 oz) 112.1 kg (247 lb 2.2 oz) 110.4 kg (243 lb 6.2 oz)    Exam:   General:  Alert and oriented 3, breathing much less labored  Cardiovascular: Regular rate and rhythm, Q000111Q, 2/6 systolic ejection murmur  Respiratory: Clear to auscultation bilaterally  Abdomen: Soft, obese, nontender, positive bowel sounds  Musculoskeletal: No clubbing or cyanosis, 2+ pitting edema from the knees down   Data Reviewed: Basic Metabolic Panel:  Recent Labs Lab 10/17/14 1030 10/18/14 0727 10/19/14 0545 10/20/14 0415  NA 145 142 143 145  K 3.5* 3.3* 3.4* 3.7  CL 108 108 108 111  CO2 22 20 20 20   GLUCOSE 184* 170* 160* 172*  BUN 70* 69* 74* 81*  CREATININE 4.03* 3.97* 4.03* 4.28*  CALCIUM 8.3* 8.0* 8.1* 7.9*  PHOS  --  4.9*  --   --  Liver Function Tests:  Recent Labs Lab 10/17/14 1030 10/18/14 0727  AST 23  --   ALT 14  --   ALKPHOS 79  --   BILITOT <0.2*  --   PROT 6.2  --   ALBUMIN 2.3* 2.0*   No results found for this basename: LIPASE, AMYLASE,  in the last 168 hours No results found for this basename: AMMONIA,  in the last 168 hours CBC:  Recent Labs Lab 10/17/14 1030 10/18/14 0727 10/19/14 0545  WBC 10.8* 10.9* 11.5*  NEUTROABS 7.0   --   --   HGB 9.8* 8.9* 8.5*  HCT 30.5* 27.7* 26.0*  MCV 92.1 90.5 90.6  PLT 173 176 170   Cardiac Enzymes:    Recent Labs Lab 10/17/14 2256 10/18/14 0012 10/18/14 0727  TROPONINI <0.30 <0.30 <0.30   BNP (last 3 results)  Recent Labs  10/18/14 0727  PROBNP 3262.0*   CBG:  Recent Labs Lab 10/19/14 1204 10/19/14 1632 10/19/14 2120 10/20/14 0753 10/20/14 1211  GLUCAP 177* 191* 147* 163* 210*    Recent Results (from the past 240 hour(s))  URINE CULTURE     Status: None   Collection Time    10/17/14  2:54 PM      Result Value Ref Range Status   Specimen Description URINE, CLEAN CATCH   Final   Special Requests NONE   Final   Culture  Setup Time     Final   Value: 10/17/2014 21:07     Performed at [facility]     Final   Value: NO GROWTH     Performed at [facility]   Culture     Final   Value: NO GROWTH     Performed at [facility]   Report Status 10/18/2014 FINAL   Final     Studies: No results found.  Scheduled Meds: . aspirin EC  81 mg Oral Daily  . atenolol  50 mg Oral Daily  . dorzolamide-timolol  1 drop Left Eye BID  . famotidine  10 mg Oral Daily  . furosemide  80 mg Intravenous Q8H  . hydrALAZINE  50 mg Oral 3 times per day  . insulin aspart  0-9 Units Subcutaneous TID WC  . insulin glargine  12 Units Subcutaneous QHS  . pantoprazole (PROTONIX) IV  40 mg Intravenous Q12H  . potassium chloride  20 mEq Oral Daily  . simvastatin  20 mg Oral QPM    Continuous Infusions:    Time spent: 20 minutes  [facility] Pager [phone]. If 7PM-7AM, please contact night-coverage at [email or web address], password [facility] 10/20/2014, 2:31 PM  LOS: 3 days

## 2014-10-20 NOTE — Evaluation (Signed)
Physical Therapy Evaluation Patient Details Name: [name] James MRN: [id number] DOB: [birth year] Today's Date: 10/20/2014   History of Present Illness  78 yo female with onset CHF and EF 55%, DM and claudication issues to reduce LE edema.    Clinical Impression  Pt was seen for evaluation with onset of LE edema related to cardiopulmonary issues, planning for home.  Her level of mobility will create a fall risk at present, and would be better to go to SNF for recovery before going home alone.  Her son is too sporadically available for her to be safe.    Follow Up Recommendations SNF;Supervision/Assistance - 24 hour    Equipment Recommendations  None recommended by PT    Recommendations for Other Services       Precautions / Restrictions Precautions Precautions: Fall Precaution Comments: on telemetry Restrictions Weight Bearing Restrictions: No      Mobility  Bed Mobility Overal bed mobility: Needs Assistance Bed Mobility: Supine to Sit     Supine to sit: Mod assist     General bed mobility comments: uses bedrail and HOB elevated  Transfers Overall transfer level: Needs assistance Equipment used: Rolling walker (2 wheeled);1 person hand held assist Transfers: Sit to/from [facility] Sit to Stand: Mod assist Stand pivot transfers: Mod assist       General transfer comment: reminders for hand placement and to sequence  Ambulation/Gait Ambulation/Gait assistance: Min assist Ambulation Distance (Feet): 5 Feet Assistive device: Rolling walker (2 wheeled) Gait Pattern/deviations: Step-to pattern;Decreased dorsiflexion - right;Decreased dorsiflexion - left;Decreased step length - left;Decreased step length - right;Shuffle;Wide base of support Gait velocity: slow Gait velocity interpretation: Below normal speed for age/gender General Gait Details: halting shuffled steps with significant edema LE   Stairs            Wheelchair Mobility     Modified Rankin (Stroke Patients Only)       Balance Overall balance assessment: Needs assistance Sitting-balance support: Bilateral upper extremity supported;Feet supported Sitting balance-Leahy Scale: Fair   Postural control: Posterior lean Standing balance support: Bilateral upper extremity supported Standing balance-Leahy Scale: Poor Standing balance comment: Generalized weakness limits standing control                             Pertinent Vitals/Pain Pain Assessment: No/denies pain    Home Living Family/patient expects to be discharged to:: Private residence Living Arrangements: Children Available Help at Discharge: Available PRN/intermittently;Family Type of Home: House Home Access: Stairs to enter Entrance Stairs-Rails: Right;Left;Can reach both Entrance Stairs-Number of Steps: 2 Home Layout: One level Home Equipment: Walker - 2 wheels;Cane - single point      Prior Function Level of Independence: Independent with assistive device(s)               Hand Dominance        Extremity/Trunk Assessment   Upper Extremity Assessment: Overall WFL for tasks assessed           Lower Extremity Assessment: Generalized weakness      Cervical / Trunk Assessment: Normal  Communication   Communication: No difficulties  Cognition Arousal/Alertness: Awake/alert Behavior During Therapy: WFL for tasks assessed/performed Overall Cognitive Status: No family/caregiver present to determine baseline cognitive functioning       Memory: Decreased short-term memory              General Comments General comments (skin integrity, edema, etc.): Pt is agreeable and expects to go  home but is diffcult to focus her attention to history, maybe over memory issues.    Exercises        Assessment/Plan    PT Assessment Patient needs continued PT services  PT Diagnosis Difficulty walking   PT Problem List Decreased strength;Decreased range of  motion;Decreased activity tolerance;Decreased balance;Decreased mobility;Decreased coordination;Decreased cognition;Decreased knowledge of use of DME;Cardiopulmonary status limiting activity;Obesity;Decreased skin integrity  PT Treatment Interventions DME instruction;Gait training;Stair training;Functional mobility training;Therapeutic activities;Therapeutic exercise;Balance training;Neuromuscular re-education;Cognitive remediation;Patient/family education   PT Goals (Current goals can be found in the Care Plan section) Acute Rehab PT Goals Patient Stated Goal: did not state PT Goal Formulation: With patient Time For Goal Achievement: 11/03/14 Potential to Achieve Goals: Fair    Frequency Min 3X/week   Barriers to discharge Inaccessible home environment;Decreased caregiver support son is working fluctuating shifts    Co-evaluation               End of Session   Activity Tolerance: Patient tolerated treatment well;Patient limited by fatigue Patient left: in chair;with call bell/phone within reach;with chair alarm set Nurse Communication: Mobility status         Time: [id number] PT Time Calculation (min): 24 min   Charges:   PT Evaluation $Initial PT Evaluation Tier I: 1 Procedure PT Treatments $Gait Training: 8-22 mins   PT G CodesRamond Dial Nov 19, 2014, 12:13 PM  Mee Hives, PT MS Acute Rehab Dept. Number: [id number]

## 2014-10-21 DIAGNOSIS — G9340 Encephalopathy, unspecified: Secondary | ICD-10-CM | POA: Diagnosis not present

## 2014-10-21 LAB — CBC
HCT: 24.8 % — ABNORMAL LOW (ref 36.0–46.0)
HEMOGLOBIN: 8.1 g/dL — AB (ref 12.0–15.0)
MCH: 29.8 pg (ref 26.0–34.0)
MCHC: 32.7 g/dL (ref 30.0–36.0)
MCV: 91.2 fL (ref 78.0–100.0)
Platelets: 204 10*3/uL (ref 150–400)
RBC: 2.72 MIL/uL — AB (ref 3.87–5.11)
RDW: 18.3 % — ABNORMAL HIGH (ref 11.5–15.5)
WBC: 11.1 10*3/uL — ABNORMAL HIGH (ref 4.0–10.5)

## 2014-10-21 LAB — BASIC METABOLIC PANEL
Anion gap: 16 — ABNORMAL HIGH (ref 5–15)
BUN: 90 mg/dL — ABNORMAL HIGH (ref 6–23)
CHLORIDE: 108 meq/L (ref 96–112)
CO2: 20 meq/L (ref 19–32)
Calcium: 8.2 mg/dL — ABNORMAL LOW (ref 8.4–10.5)
Creatinine, Ser: 4.46 mg/dL — ABNORMAL HIGH (ref 0.50–1.10)
GFR calc Af Amer: 10 mL/min — ABNORMAL LOW (ref >90)
GFR calc non Af Amer: 9 mL/min — ABNORMAL LOW (ref >90)
Glucose, Bld: 178 mg/dL — ABNORMAL HIGH (ref 70–99)
Potassium: 4.2 mEq/L (ref 3.7–5.3)
SODIUM: 144 meq/L (ref 137–147)

## 2014-10-21 LAB — GLUCOSE, CAPILLARY
GLUCOSE-CAPILLARY: 201 mg/dL — AB (ref 70–99)
GLUCOSE-CAPILLARY: 202 mg/dL — AB (ref 70–99)
GLUCOSE-CAPILLARY: 205 mg/dL — AB (ref 70–99)
Glucose-Capillary: 252 mg/dL — ABNORMAL HIGH (ref 70–99)

## 2014-10-21 MED ORDER — HYDRALAZINE HCL 20 MG/ML IJ SOLN
10.0000 mg | Freq: Four times a day (QID) | INTRAMUSCULAR | Status: DC | PRN
  Administered 2014-10-21 – 2014-10-23 (×2): 10 mg via INTRAVENOUS
  Filled 2014-10-21 (×2): qty 1

## 2014-10-21 MED ORDER — INSULIN GLARGINE 100 UNIT/ML ~~LOC~~ SOLN
14.0000 [IU] | Freq: Every day | SUBCUTANEOUS | Status: DC
  Administered 2014-10-21 – 2014-10-22 (×2): 14 [IU] via SUBCUTANEOUS
  Filled 2014-10-21 (×3): qty 0.14

## 2014-10-21 NOTE — Progress Notes (Signed)
PROGRESS NOTE  [name] James [id number] DOB: 1936-11-03 DOA: 10/17/2014 PCP: Shirline Frees, MD  HPI/Recap of past 29 hours: 78 year old female with past medical history of stage III chronic kidney disease and baseline creatinine of 3 who had stable labs 1 month prior resentment to the emergency room with several days of worsening shortness of breath and found to be in pulmonary edema with a creatinine of 4. Patient started on IV Lasix and case discussed with nephrology.   Patient continues to have significant diuresis and has now diuresed over 6 L. This morning, 10/31, patient looks to be confused. She is talking about random things and it sounds like she has spoken to her daughter and was upset that her daughter told her that she was not making any sense.  Assessment/Plan: Principal Problem:   Acute diastolic CHF (congestive heart failure): Continue aggressive Lasix, as long as patient responds to diuresis, stable.Continue Lasix and follow renal function. Will discuss further with nephrology if creatinine goes above 5 or diuresis tapers off. Active Problems:   AKI (acute kidney injury) in the setting of chronic kidney disease stage III:  This is cardiorenal syndrome. Spoke with nephrology, continue diuresis and hopefully this will improve   HTN (hypertension): Blood pressure starting to trend down, responding well with diuresis plus hydralazine scheduled by mouth increased on 10/29    Type I diabetes mellitus with complication, uncontrolled: Continue sliding scale, CBGs overall under 200, continuing to titrate up Lantus    Dyslipidemia: Stable continue statin   Anemia: Secondary to chronic liver disease. There were reports of dark stools earlier. Patient's baseline hemoglobin is around 10-11. Presented to the emergency room with a hemoglobin of near 10 and it dropped initially on 10/28, but is still not dropped less than a gram since then 3 days later.    Morbid obesity: Patient  meets criteria with BMI greater than 40. Encephalopathy: Unclear etiology. Could be from worsening renal failure and uremia versus other. Have stopped when necessary Xanax. If persisting by tomorrow and association with worsening creatinine, we'll stop Lasix and gently hydrate   Code Status: Full code  Family Communication: Spoke with son by phone yesterday  Disposition Plan: Continue in-hospital until fully diuresed, renal function stabilized and confirmed no active bleeding. Seen by physical therapy recommending skilled nursing   Consultants:  Discussed with nephrology  Procedures:  None  Antibiotics:  None   Objective: BP 186/67  Pulse 69  Temp(Src) 98 F (36.7 C) (Oral)  Resp 20  Ht 5\' 3"  (1.6 m)  Wt 112.855 kg (248 lb 12.8 oz)  BMI 44.08 kg/m2  SpO2 99%  Intake/Output Summary (Last 24 hours) at 10/21/14 1524 Last data filed at 10/21/14 1024  Gross per 24 hour  Intake    360 ml  Output   1350 ml  Net   -990 ml   Filed Weights   10/19/14 0646 10/20/14 0500 10/21/14 0645  Weight: 112.1 kg (247 lb 2.2 oz) 110.4 kg (243 lb 6.2 oz) 112.855 kg (248 lb 12.8 oz)    Exam:   General:  Alert, but oriented 1. Somewhat delirious.  Cardiovascular: Regular rate and rhythm, Q000111Q, 2/6 systolic ejection murmur  Respiratory: Clear to auscultation bilaterally  Abdomen: Soft, obese, nontender, positive bowel sounds  Musculoskeletal: No clubbing or cyanosis, 2+ pitting edema from the knees down . No focal neurological deficits  Data Reviewed: Basic Metabolic Panel:  Recent Labs Lab 10/17/14 1030 10/18/14 0727 10/19/14 0545 10/20/14 0415 10/21/14 [id number]  NA 145 142 143 145 144  K 3.5* 3.3* 3.4* 3.7 4.2  CL 108 108 108 111 108  CO2 22 20 20 20 20   GLUCOSE 184* 170* 160* 172* 178*  BUN 70* 69* 74* 81* 90*  CREATININE 4.03* 3.97* 4.03* 4.28* 4.46*  CALCIUM 8.3* 8.0* 8.1* 7.9* 8.2*  PHOS  --  4.9*  --   --   --    Liver Function Tests:  Recent Labs Lab  10/17/14 1030 10/18/14 0727  AST 23  --   ALT 14  --   ALKPHOS 79  --   BILITOT <0.2*  --   PROT 6.2  --   ALBUMIN 2.3* 2.0*   No results found for this basename: LIPASE, AMYLASE,  in the last 168 hours No results found for this basename: AMMONIA,  in the last 168 hours CBC:  Recent Labs Lab 10/17/14 1030 10/18/14 0727 10/19/14 0545 10/21/14 0551  WBC 10.8* 10.9* 11.5* 11.1*  NEUTROABS 7.0  --   --   --   HGB 9.8* 8.9* 8.5* 8.1*  HCT 30.5* 27.7* 26.0* 24.8*  MCV 92.1 90.5 90.6 91.2  PLT 173 176 170 204   Cardiac Enzymes:    Recent Labs Lab 10/17/14 2256 10/18/14 0012 10/18/14 0727  TROPONINI <0.30 <0.30 <0.30   BNP (last 3 results)  Recent Labs  10/18/14 0727  PROBNP 3262.0*   CBG:  Recent Labs Lab 10/20/14 1211 10/20/14 1706 10/20/14 2100 10/21/14 0758 10/21/14 1228  GLUCAP 210* 192* 209* 201* 252*    Recent Results (from the past 240 hour(s))  URINE CULTURE     Status: None   Collection Time    10/17/14  2:54 PM      Result Value Ref Range Status   Specimen Description URINE, CLEAN CATCH   Final   Special Requests NONE   Final   Culture  Setup Time     Final   Value: 10/17/2014 21:07     Performed at [facility]     Final   Value: NO GROWTH     Performed at [facility]   Culture     Final   Value: NO GROWTH     Performed at [facility]   Report Status 10/18/2014 FINAL   Final     Studies: No results found.  Scheduled Meds: . aspirin EC  81 mg Oral Daily  . atenolol  50 mg Oral Daily  . dorzolamide-timolol  1 drop Left Eye BID  . enoxaparin (LOVENOX) injection  30 mg Subcutaneous Q24H  . famotidine  10 mg Oral Daily  . furosemide  80 mg Intravenous Q8H  . hydrALAZINE  50 mg Oral 3 times per day  . insulin aspart  0-9 Units Subcutaneous TID WC  . insulin glargine  14 Units Subcutaneous QHS  . pantoprazole  40 mg Oral BID  . potassium chloride  20 mEq Oral Daily  . simvastatin  20 mg Oral  QPM    Continuous Infusions:    Time spent: 35 minutes  [facility] Pager [phone]. If 7PM-7AM, please contact night-coverage at [email or web address], password [facility] 10/21/2014, 3:24 PM  LOS: 4 days

## 2014-10-22 DIAGNOSIS — D62 Acute posthemorrhagic anemia: Secondary | ICD-10-CM

## 2014-10-22 LAB — CBC
HCT: 21.9 % — ABNORMAL LOW (ref 36.0–46.0)
Hemoglobin: 7.1 g/dL — ABNORMAL LOW (ref 12.0–15.0)
MCH: 29.8 pg (ref 26.0–34.0)
MCHC: 32.4 g/dL (ref 30.0–36.0)
MCV: 92 fL (ref 78.0–100.0)
PLATELETS: 188 10*3/uL (ref 150–400)
RBC: 2.38 MIL/uL — ABNORMAL LOW (ref 3.87–5.11)
RDW: 18.5 % — AB (ref 11.5–15.5)
WBC: 10.4 10*3/uL (ref 4.0–10.5)

## 2014-10-22 LAB — BASIC METABOLIC PANEL
ANION GAP: 14 (ref 5–15)
BUN: 102 mg/dL — ABNORMAL HIGH (ref 6–23)
CHLORIDE: 106 meq/L (ref 96–112)
CO2: 22 mEq/L (ref 19–32)
Calcium: 8.1 mg/dL — ABNORMAL LOW (ref 8.4–10.5)
Creatinine, Ser: 5.03 mg/dL — ABNORMAL HIGH (ref 0.50–1.10)
GFR calc non Af Amer: 8 mL/min — ABNORMAL LOW (ref >90)
GFR, EST AFRICAN AMERICAN: 9 mL/min — AB (ref >90)
Glucose, Bld: 213 mg/dL — ABNORMAL HIGH (ref 70–99)
Potassium: 4.2 mEq/L (ref 3.7–5.3)
Sodium: 142 mEq/L (ref 137–147)

## 2014-10-22 LAB — HEMOGLOBIN AND HEMATOCRIT, BLOOD
HCT: 21.3 % — ABNORMAL LOW (ref 36.0–46.0)
Hemoglobin: 6.8 g/dL — CL (ref 12.0–15.0)

## 2014-10-22 LAB — GLUCOSE, CAPILLARY
Glucose-Capillary: 218 mg/dL — ABNORMAL HIGH (ref 70–99)
Glucose-Capillary: 219 mg/dL — ABNORMAL HIGH (ref 70–99)
Glucose-Capillary: 254 mg/dL — ABNORMAL HIGH (ref 70–99)
Glucose-Capillary: 204 mg/dL — ABNORMAL HIGH (ref 70–99)

## 2014-10-22 LAB — PREPARE RBC (CROSSMATCH)

## 2014-10-22 MED ORDER — SODIUM CHLORIDE 0.9 % IV SOLN
Freq: Once | INTRAVENOUS | Status: AC
  Administered 2014-10-23: 02:00:00 via INTRAVENOUS

## 2014-10-22 MED ORDER — SODIUM CHLORIDE 0.9 % IV SOLN
INTRAVENOUS | Status: DC
  Administered 2014-10-22: 1000 mL via INTRAVENOUS
  Administered 2014-10-23: 09:00:00 via INTRAVENOUS

## 2014-10-22 MED ORDER — OXYCODONE HCL 5 MG PO TABS
2.5000 mg | ORAL_TABLET | ORAL | Status: DC | PRN
Start: 2014-10-22 — End: 2014-11-02
  Administered 2014-10-22 – 2014-10-27 (×6): 2.5 mg via ORAL
  Filled 2014-10-22 (×6): qty 1

## 2014-10-22 NOTE — Progress Notes (Signed)
CRITICAL VALUE ALERT  Critical value received:  Hgb=6.8  Date of notification:  10/22/2014  Time of notification:  1915  Critical value read back:Yes.    Nurse who received alert:  Alphonsus Sias  MD notified (1st page):  Dr. Hilbert Bible  Time of first page:  1915  MD notified (2nd page):  Time of second page:  Responding MD: new orders  Time MD responded: 1925

## 2014-10-22 NOTE — Clinical Social Work Psychosocial (Signed)
Clinical Social Work Department BRIEF PSYCHOSOCIAL ASSESSMENT 10/22/2014  Patient:  [name] James, [name] James     Account Number:  [id number]     Admit date:  10/17/2014  Clinical Social Worker:  Hubert Azure  Date/Time:  10/22/2014 07:51 PM  Referred by:  Physician  Date Referred:  10/22/2014 Referred for  SNF Placement   Other Referral:   Interview type:  Patient Other interview type:    PSYCHOSOCIAL DATA Living Status:  ALONE Admitted from facility:   Level of care:   Primary support name:  [name] (son) [phone], Kenney Houseman ([phone] Primary support relationship to patient:  CHILD, ADULT Degree of support available:   Good    CURRENT CONCERNS Current Concerns  Post-Acute Placement   Other Concerns:    SOCIAL WORK ASSESSMENT / PLAN CSW met with patient who was alert and oriented. CSW introduced self and explained role. CSW explained SNF process and discussed d/c plan. Per patient, she resides alone, but son [name]) checks on her often and calls everyday. Patient reports she has been to [facility] before and it was "great," but now she does not know about facility as she has heard from friends that a lot of the SNF "have gone down." Patient expressed concern with "cleanliness" and quality of care provided at facilities and wants to make sure she goes to a "good one." Per patient, she will have to discuss SNF placement with daughter as she has no preference, but wants to make the best decision.   Assessment/plan status:  Other - See comment Other assessment/ plan:   CSW to update FL2 for SNF placement.   Information/referral to community resources:    PATIENT'S/FAMILY'S RESPONSE TO PLAN OF CARE: Patient was cooperative and agreeable to SNF, but expressed concern about quality of care provided at facilities and cleanliness. Patient states she does not want to go to a facility with bugs and "stuff."    Carrington Clamp, LCSWA Weekend Clinical  Social Worker [phone]

## 2014-10-22 NOTE — Progress Notes (Signed)
PROGRESS NOTE  [name] James [id number] DOB: [birth year] DOA: 10/17/2014 PCP: Shirline Frees, MD  HPI/Recap of past 84 hours: 78 year old female with past medical history of stage III chronic kidney disease and baseline creatinine of 3 who had stable labs 1 month prior resentment to the emergency room with several days of worsening shortness of breath and found to be in pulmonary edema with a creatinine of 4. Patient started on IV Lasix and case discussed with nephrology.   Patient's diuresis as around 6 L. Creatinine up to 5 today, so Lasix stopped and gentle IV fluids started. Interestingly, she is much more alert and oriented today despite this.  Assessment/Plan: Principal Problem:   Acute diastolic CHF (congestive heart failure): diuresed 6 L.  Active Problems:   AKI (acute kidney injury) in the setting of chronic kidney disease stage III:  This is cardiorenal syndrome. Over diuresed and creatinine up to 5. Have stopped Lasix and gentle IV fluids. Recheck creatinine in the morning. Based on results, will discuss with nephrology    HTN (hypertension): pressures doing much better. On scheduled hydralazine.    Type I diabetes mellitus with complication, uncontrolled: Continue sliding scale, CBGs overall under 200, continuing to titrate up Lantus    Dyslipidemia: Stable continue statin   Anemia: Secondary to chronic liver disease. There were reports of dark stools earlier. Patient's baseline hemoglobin is around 10-11. Presented to the emergency room with a hemoglobin of near 10 and since that time has been slowly declining. She has leveled off for several days, but again today has dropped another gram. Will repeat Hemoccult and consult gastroenterology.GI blood loss may explain her worsening renal function.  Repeat H&H this afternoon and if below 7, will transfuse 2 units    Morbid obesity: Patient meets criteria with BMI greater than 40. Encephalopathy: Unclear etiology.  Resolved.   Code Status: Full code  Family Communication: left message with some  Disposition Plan: Continue in-hospital  renal function stabilized and confirmed no active bleeding. Seen by physical therapy recommending skilled nursing   Consultants:  Discussed with nephrology  Procedures:  None  Antibiotics:  None   Objective: BP 137/60 mmHg  Pulse 71  Temp(Src) 97.7 F (36.5 C) (Oral)  Resp 18  Ht 5\' 3"  (1.6 m)  Wt 113.535 kg (250 lb 4.8 oz)  BMI 44.35 kg/m2  SpO2 98%  Intake/Output Summary (Last 24 hours) at 10/22/14 1406 Last data filed at 10/22/14 1100  Gross per 24 hour  Intake    900 ml  Output   1200 ml  Net   -300 ml   Filed Weights   10/20/14 0500 10/21/14 0645 10/22/14 0700  Weight: 110.4 kg (243 lb 6.2 oz) 112.855 kg (248 lb 12.8 oz) 113.535 kg (250 lb 4.8 oz)    Exam:   General:  Alert, but oriented 1. Somewhat delirious.  Cardiovascular: Regular rate and rhythm, [id number], 2/6 systolic ejection murmur  Respiratory: Clear to auscultation bilaterally  Abdomen: Soft, obese, nontender, positive bowel sounds  Musculoskeletal: No clubbing or cyanosis, 2+ pitting edema from the knees down . No focal neurological deficits  Data Reviewed: Basic Metabolic Panel:  Recent Labs Lab 10/18/14 0727 10/19/14 0545 10/20/14 0415 10/21/14 0551 10/22/14 0530  NA 142 143 145 144 142  K 3.3* 3.4* 3.7 4.2 4.2  CL 108 108 111 108 106  CO2 20 20 20 20 22   GLUCOSE 170* 160* 172* 178* 213*  BUN 69* 74* 81* 90* 102*  CREATININE 3.97* 4.03*  4.28* 4.46* 5.03*  CALCIUM 8.0* 8.1* 7.9* 8.2* 8.1*  PHOS 4.9*  --   --   --   --    Liver Function Tests:  Recent Labs Lab 10/17/14 1030 10/18/14 0727  AST 23  --   ALT 14  --   ALKPHOS 79  --   BILITOT <0.2*  --   PROT 6.2  --   ALBUMIN 2.3* 2.0*   No results for input(s): LIPASE, AMYLASE in the last 168 hours. No results for input(s): AMMONIA in the last 168 hours. CBC:  Recent Labs Lab  10/17/14 1030 10/18/14 0727 10/19/14 0545 10/21/14 0551 10/22/14 0530  WBC 10.8* 10.9* 11.5* 11.1* 10.4  NEUTROABS 7.0  --   --   --   --   HGB 9.8* 8.9* 8.5* 8.1* 7.1*  HCT 30.5* 27.7* 26.0* 24.8* 21.9*  MCV 92.1 90.5 90.6 91.2 92.0  PLT 173 176 170 204 188   Cardiac Enzymes:    Recent Labs Lab 10/17/14 2256 10/18/14 0012 10/18/14 0727  TROPONINI <0.30 <0.30 <0.30   BNP (last 3 results)  Recent Labs  10/18/14 0727  PROBNP 3262.0*   CBG:  Recent Labs Lab 10/21/14 1228 10/21/14 1727 10/21/14 2153 10/22/14 0755 10/22/14 1219  GLUCAP 252* 202* 205* 218* 219*    Recent Results (from the past 240 hour(s))  Urine culture     Status: None   Collection Time: 10/17/14  2:54 PM  Result Value Ref Range Status   Specimen Description URINE, CLEAN CATCH  Final   Special Requests NONE  Final   Culture  Setup Time   Final    10/17/2014 21:07 Performed at [facility] Performed at [facility]  Final   Culture NO GROWTH Performed at [facility]  Final   Report Status 10/18/2014 FINAL  Final     Studies: No results found.  Scheduled Meds: . aspirin EC  81 mg Oral Daily  . atenolol  50 mg Oral Daily  . dorzolamide-timolol  1 drop Left Eye BID  . enoxaparin (LOVENOX) injection  30 mg Subcutaneous Q24H  . famotidine  10 mg Oral Daily  . hydrALAZINE  50 mg Oral 3 times per day  . insulin aspart  0-9 Units Subcutaneous TID WC  . insulin glargine  14 Units Subcutaneous QHS  . pantoprazole  40 mg Oral BID  . potassium chloride  20 mEq Oral Daily  . simvastatin  20 mg Oral QPM    Continuous Infusions: . sodium chloride 1,000 mL (10/22/14 0827)     Time spent: 25 minutes  [facility] Pager [phone]. If 7PM-7AM, please contact night-coverage at [email or web address], password [facility] 10/22/2014, 2:06 PM  LOS: 5 days

## 2014-10-22 NOTE — Clinical Social Work Placement (Signed)
Clinical Social Work Department CLINICAL SOCIAL WORK PLACEMENT NOTE 10/22/2014  Patient:  [name] James, [name] James  Account Number:  [id number] Admit date:  10/17/2014  Clinical Social Worker:  Carrington Clamp, LCSWA  Date/time:  10/22/2014 08:58 PM  Clinical Social Work is seeking post-discharge placement for this patient at the following level of care:   [facility]   (*CSW will update this form in Epic as items are completed)   10/22/2014  Patient/family provided with [place] Department of Clinical Social Work's list of facilities offering this level of care within the geographic area requested by the patient (or if unable, by the patient's family).  10/22/2014  Patient/family informed of their freedom to choose among providers that offer the needed level of care, that participate in Medicare, Medicaid or managed care program needed by the patient, have an available bed and are willing to accept the patient.  10/22/2014  Patient/family informed of [facility]' ownership interest in [facility], as well as of the fact that they are under no obligation to receive care at this facility.  PASARR submitted to EDS on Existing PASARR number received on Existing  FL2 transmitted to all facilities in geographic area requested by pt/family on  10/22/2014 FL2 transmitted to all facilities within larger geographic area on   Patient informed that his/her managed care company has contracts with or will negotiate with  certain facilities, including the following:     Patient/family informed of bed offers received:   Patient chooses bed at  Physician recommends and patient chooses bed at    Patient to be transferred to  on   Patient to be transferred to facility by  Patient and family notified of transfer on  Name of family member notified:    The following physician request were entered in Epic:   Additional Comments:  Lena, Amanda Weekend  Clinical Social Worker [phone]

## 2014-10-23 ENCOUNTER — Encounter (HOSPITAL_COMMUNITY): Payer: Self-pay | Admitting: Physician Assistant

## 2014-10-23 ENCOUNTER — Inpatient Hospital Stay (HOSPITAL_COMMUNITY): Payer: Medicare Other

## 2014-10-23 DIAGNOSIS — K921 Melena: Secondary | ICD-10-CM

## 2014-10-23 DIAGNOSIS — N183 Chronic kidney disease, stage 3 (moderate): Secondary | ICD-10-CM

## 2014-10-23 DIAGNOSIS — I5033 Acute on chronic diastolic (congestive) heart failure: Secondary | ICD-10-CM

## 2014-10-23 DIAGNOSIS — R0600 Dyspnea, unspecified: Secondary | ICD-10-CM

## 2014-10-23 DIAGNOSIS — G9340 Encephalopathy, unspecified: Secondary | ICD-10-CM

## 2014-10-23 DIAGNOSIS — D6489 Other specified anemias: Secondary | ICD-10-CM

## 2014-10-23 LAB — CBC
HEMATOCRIT: 26 % — AB (ref 36.0–46.0)
HEMOGLOBIN: 8.4 g/dL — AB (ref 12.0–15.0)
MCH: 30.2 pg (ref 26.0–34.0)
MCHC: 32.3 g/dL (ref 30.0–36.0)
MCV: 93.5 fL (ref 78.0–100.0)
Platelets: 196 10*3/uL (ref 150–400)
RBC: 2.78 MIL/uL — ABNORMAL LOW (ref 3.87–5.11)
RDW: 18.6 % — ABNORMAL HIGH (ref 11.5–15.5)
WBC: 12.1 10*3/uL — AB (ref 4.0–10.5)

## 2014-10-23 LAB — BASIC METABOLIC PANEL
Anion gap: 16 — ABNORMAL HIGH (ref 5–15)
BUN: 105 mg/dL — ABNORMAL HIGH (ref 6–23)
CHLORIDE: 108 meq/L (ref 96–112)
CO2: 19 meq/L (ref 19–32)
Calcium: 8 mg/dL — ABNORMAL LOW (ref 8.4–10.5)
Creatinine, Ser: 4.71 mg/dL — ABNORMAL HIGH (ref 0.50–1.10)
GFR calc Af Amer: 9 mL/min — ABNORMAL LOW (ref >90)
GFR calc non Af Amer: 8 mL/min — ABNORMAL LOW (ref >90)
GLUCOSE: 223 mg/dL — AB (ref 70–99)
POTASSIUM: 4.7 meq/L (ref 3.7–5.3)
SODIUM: 143 meq/L (ref 137–147)

## 2014-10-23 LAB — GLUCOSE, CAPILLARY
Glucose-Capillary: 205 mg/dL — ABNORMAL HIGH (ref 70–99)
Glucose-Capillary: 254 mg/dL — ABNORMAL HIGH (ref 70–99)
Glucose-Capillary: 173 mg/dL — ABNORMAL HIGH (ref 70–99)
Glucose-Capillary: 141 mg/dL — ABNORMAL HIGH (ref 70–99)

## 2014-10-23 MED ORDER — FUROSEMIDE 10 MG/ML IJ SOLN
40.0000 mg | Freq: Once | INTRAMUSCULAR | Status: AC
  Administered 2014-10-23: 40 mg via INTRAVENOUS
  Filled 2014-10-23: qty 4

## 2014-10-23 MED ORDER — INSULIN GLARGINE 100 UNIT/ML ~~LOC~~ SOLN
20.0000 [IU] | Freq: Every day | SUBCUTANEOUS | Status: DC
  Administered 2014-10-23 – 2014-10-29 (×7): 20 [IU] via SUBCUTANEOUS
  Filled 2014-10-23 (×8): qty 0.2

## 2014-10-23 MED ORDER — INSULIN ASPART 100 UNIT/ML ~~LOC~~ SOLN
4.0000 [IU] | Freq: Three times a day (TID) | SUBCUTANEOUS | Status: DC
  Administered 2014-10-23 – 2014-10-30 (×20): 4 [IU] via SUBCUTANEOUS

## 2014-10-23 NOTE — Plan of Care (Signed)
Problem: Phase I Progression Outcomes Goal: Initial discharge plan identified Outcome: Completed/Met Date Met:  10/23/14 Goal: Voiding-avoid urinary catheter unless indicated Outcome: Completed/Met Date Met:  10/23/14  Problem: Phase II Progression Outcomes Goal: Progress activity as tolerated unless otherwise ordered Outcome: Progressing Goal: Obtain order to discontinue catheter if appropriate Outcome: Progressing

## 2014-10-23 NOTE — Consult Note (Signed)
[facility] Consult: 9:58 AM 10/23/2014  LOS: 6 days    Referring Provider: Dr Gevena Barre  Primary Care Physician:  Shirline Frees, MD Primary Gastroenterologist:  Dr. Delfin Edis, for screening colonoscopy > 10 yrs ago     Reason for Consultation:  Anemia, FOBT +   HPI: [name] James is a 78 y.o. female.  Morbidly obese, insulin requiring type 2 diabetic.  Stage 3 CKD. Anemia.  Had been RXd po iron in past but not able to tolerated due to constipation.  UTI this summer. Minimally ambulatory with a walker.   Unable to locate report on apparently negative screening colonoscopy > 10 yrs ago.  Admitted 6 days ago with DOE, resting dyspnea, LE edema.  cxr with vascular congestion without pulmonary edema.  2D echo shows 60 to 65% LVEF. Grade 1 diastolic dysfunction, trivial aortic regurge. . Acute renal failure as a result of diuresis.   Hgb of 9.8, 8.5 post hydration.  Was 11.4 in 03/2014.  MCV normal.  Hgb drifted to 6.8 hosp day 5, transfused 2 units to 8.4.   She is FOB + and she reports some occasional minor rectal bleeding if she strains at [facility].  Occasionally uses laxatives. + anorexia, + dysphagia to solids which she reports is choking in upper throat as she swallows, not tightness at GEJx.  Not clear there has been weight loss.  No nausea.  Heartburn controlled with Zantac 150 BID.  Takes 81 ASA but no NSAIDs.  No unusual bruising or excessive bleeding.  No nose bleeds.  No blood in urine.   Her main complaint is weakness and dyspnea which persist despite the transfusion and diuresis.  Weight is stable within 1# of admission.      Past Medical History  Diagnosis Date  . CHF (congestive heart failure) 05/13/2011    echo - [id number]; stage 1 diastolic dysfunction; elevated LV filling pressure, MAC  . Chest  pain, atypical 12/05/2008    R/Lmv- normal perfusion all regions, noe ECG changes   . Renal insufficiency 10/30/2005    doppler - abn resistance consistent w/ parenchymal disease  . Claudication 10/30/2005    doppler - normal evaluation, no evidence of aneurysm, diameter reduction, dissection, compression or vascular abnormality)  . DM (diabetes mellitus)   . Peripheral neuropathy   . Hypertension   . Edema   . Gout   . Anxiety   . Arthritis   . Cataract   . GERD (gastroesophageal reflux disease)   . Breast cancer 1980s    bil mastectomies, no radiation or chemo    Past Surgical History  Procedure Laterality Date  . Mastectomy Bilateral ~1982    bil breast reconstruction with implants  . Back surgery  ~1980  . Hemorrhoid surgery    . Cholecystectomy    . Eye surgery    . Orif tibia & fibula fractures Left 2007    also had left non displaced malleolar fracture.     Prior to Admission medications   Medication Sig Start Date End Date Taking? Authorizing Provider  allopurinol (ZYLOPRIM) 100 MG tablet Take 100 mg by mouth 2 (two) times daily.   Yes Historical Provider, MD  aspirin EC 81 MG tablet Take 81 mg by mouth daily.   Yes Historical Provider, MD  atenolol (TENORMIN) 50 MG tablet Take 50 mg by mouth daily.   Yes Historical Provider, MD  brimonidine (ALPHAGAN) 0.2 % ophthalmic solution 3 (three) times daily.   Yes Historical Provider, MD  diazepam (VALIUM) 5 MG tablet Take 5 mg by mouth every 6 (six) hours as needed for anxiety.    Yes Historical Provider, MD  dorzolamide-timolol (COSOPT) 22.3-6.8 MG/ML ophthalmic solution 1 drop 2 (two) times daily.   Yes Historical Provider, MD  furosemide (LASIX) 40 MG tablet Take 40 mg by mouth 2 (two) times daily.   Yes Historical Provider, MD  glipiZIDE (GLUCOTROL) 10 MG tablet Take 10 mg by mouth 2 (two) times daily before a meal.   Yes Historical Provider, MD  insulin NPH-insulin regular (NOVOLIN 70/30) (70-30) 100 UNIT/ML injection  Inject 64-68 Units into the skin 2 (two) times daily with a meal.    Yes Historical Provider, MD  Insulin Syringe-Needle U-100 30G X 1/2" 1 ML MISC by Does not apply route.   Yes Historical Provider, MD  ranitidine (ZANTAC) 150 MG capsule Take 150 mg by mouth 2 (two) times daily.   Yes Historical Provider, MD  simvastatin (ZOCOR) 20 MG tablet Take 20 mg by mouth every evening.   Yes Historical Provider, MD  sitaGLIPtin (JANUVIA) 100 MG tablet Take 100 mg by mouth daily.   Yes Historical Provider, MD    Scheduled Meds: . aspirin EC  81 mg Oral Daily  . atenolol  50 mg Oral Daily  . dorzolamide-timolol  1 drop Left Eye BID  . enoxaparin (LOVENOX) injection  30 mg Subcutaneous Q24H  . hydrALAZINE  50 mg Oral 3 times per day  . insulin aspart  0-9 Units Subcutaneous TID WC  . insulin glargine  14 Units Subcutaneous QHS  . pantoprazole  40 mg Oral BID  . potassium chloride  20 mEq Oral Daily  . simvastatin  20 mg Oral QPM   Infusions: . sodium chloride 50 mL/hr at 10/23/14 0839   PRN Meds: acetaminophen, hydrALAZINE, ondansetron (ZOFRAN) IV, oxyCODONE   Allergies as of 10/17/2014 - Review Complete 10/17/2014  Allergen Reaction Noted  . Erythromycin Swelling 02/01/2013  . Penicillins  02/01/2013  . Codeine Rash 02/01/2013    Family History  Problem Relation Age of Onset  . Cancer Mother   . Diabetes Mother   . Heart disease Mother   . Heart attack Father   . Stroke Father   . Parkinson's disease Brother   . Cancer Sister     History   Social History  . Marital Status: Divorced    Spouse Name: N/A    Number of Children: N/A  . Years of Education: N/A   Occupational History  . Not on file.   Social History Main Topics  . Smoking status: Never Smoker   . Smokeless tobacco: Not on file  . Alcohol Use: No  . Drug Use: Not on file  . Sexual Activity: Not on file   Other Topics Concern  . Not on file   Social History Narrative    REVIEW OF  SYSTEMS: Constitutional:  Weakness, fatigue ENT:  No nose bleeds Pulm:  As per HPI CV:  No palpitations, no LE edema.  GU:  No hematuria, no frequency GI:  Per  HPI.  No hx of liver disease or LFT abnormality.  Heme:  Per HPI   Transfusions:  Per HPI.  Only other transfusion was a time of childbirth.  Neuro:  No headaches, no peripheral tingling or numbness.  Balance is poor, uses walker for even short distances.  No hx Parkinsons or tremor Derm:  No itching, no rash or sores.  Endocrine:  No sweats or chills.  No polyuria or dysuria Immunization:  Not queried.  Travel:  None beyond local counties in last few months.    PHYSICAL EXAM: Vital signs in last 24 hours: Filed Vitals:   10/23/14 0956  BP: 151/58  Pulse:   Temp: 97.6 F (36.4 C)  Resp:    Wt Readings from Last 3 Encounters:  10/23/14 250 lb 4.8 oz (113.535 kg)  04/02/14 233 lb (105.688 kg)  03/16/14 242 lb (109.77 kg)    General: obese, frail, weak, pale elderly WF Head:  No asymmetry or swelling  Eyes:  No icterus or pallor.  EOMI Ears:  Slightly SOB  Nose:  No congestion or discharged Mouth:  Clear, moist.  Dentition fair Neck:  No mass, no TMG, no bruits Lungs:  Clear but reduced BS.  No cough.  Dyspnea with speech.  Halting, weak speech.  Heart: RRR,  No MRG.  Very distant heartsounds Abdomen:  Soft, obese NT, ND.  No mass or HSM.  No bruits. .   Rectal: deep brown stool is formed in vault, it is 2 to 3+ FOBT +   Musc/Skeltl: no joint redness or deformity Extremities:  Non-pitting pedal edema  Neurologic:  Oriented x 3.  No tremor or asterixis.   Speech is halting and weak c/w psychomotor retardation, almost like she had CVA or Parkinsons.  Skin:  Diabetic acanthose in folds of groin.  Folds smell of yeast.  Tattoos:  none Nodes:  No cervical adenopathy   Psych:  Peasant, cooperative.  Relaxed.  Depressed affect.   Intake/Output from previous day: 11/01 0701 - 11/02 0700 In: 1248.3 [P.O.:760;  I.V.:488.3] Out: 700 [Urine:700] Intake/Output this shift:    LAB RESULTS:  Recent Labs  10/21/14 0551 10/22/14 0530 10/22/14 1606 10/23/14 0735  WBC 11.1* 10.4  --  12.1*  HGB 8.1* 7.1* 6.8* 8.4*  HCT 24.8* 21.9* 21.3* 26.0*  PLT 204 188  --  196   BMET Lab Results  Component Value Date   NA 143 10/23/2014   NA 142 10/22/2014   NA 144 10/21/2014   K 4.7 10/23/2014   K 4.2 10/22/2014   K 4.2 10/21/2014   CL 108 10/23/2014   CL 106 10/22/2014   CL 108 10/21/2014   CO2 19 10/23/2014   CO2 22 10/22/2014   CO2 20 10/21/2014   GLUCOSE 223* 10/23/2014   GLUCOSE 213* 10/22/2014   GLUCOSE 178* 10/21/2014   BUN 105* 10/23/2014   BUN 102* 10/22/2014   BUN 90* 10/21/2014   CREATININE 4.71* 10/23/2014   CREATININE 5.03* 10/22/2014   CREATININE 4.46* 10/21/2014   CALCIUM 8.0* 10/23/2014   CALCIUM 8.1* 10/22/2014   CALCIUM 8.2* 10/21/2014   LFT No results for input(s): PROT, ALBUMIN, AST, ALT, ALKPHOS, BILITOT, BILIDIR, IBILI in the last 72 hours. PT/INR Lab Results  Component Value Date   INR 1.14 10/17/2014   Iron/TIBC/Ferritin/ %Sat    Component Value Date/Time   IRON 106 10/17/2014 2256   TIBC 285 10/17/2014 2256   FERRITIN 41 10/17/2014 2256   IRONPCTSAT 37 10/17/2014 2256  IRONPCTSAT 22 09/06/2008 1552   Hepatitis Panel No results for input(s): HEPBSAG, HCVAB, HEPAIGM, HEPBIGM in the last 72 hours. C-Diff No components found for: CDIFF Lipase  No results found for: LIPASE  Drugs of Abuse  No results found for: LABOPIA, COCAINSCRNUR, LABBENZ, AMPHETMU, THCU, LABBARB   RADIOLOGY STUDIES: No results found.  ENDOSCOPIC STUDIES: > 10 years ago.  No Epic records of procedure or pathology.   Told she would not need a colonoscopy for 10 yrs.    IMPRESSION:   * FOBT +, normocytic anemia. S/p 2 units PRBCs.  Anemia is acute on chronic. Not iron deficient at present.  Unable to tolerate po iron in past.    *  Dysphagia, at upper esophagus.   *  DM2,  insulin dependent.  *  CHF, diastolic.  Sats in high 90s but still significantly dyspneic.   *  ARF. Medical renal dz and renal cysts confirmed by ultrasound.   *  Remote breast cancer.   S/p bil mastectomy in early 1980s.    PLAN:     *  ? EGD.  Will d/w Dr Hilarie Fredrickson. Does not seem well enough to undergo colonoscopy.   *  TSH in AM. *  Stopped Pepcid as it is redundant given she how receives Protonix.  *  Daily weights.     Azucena Freed  10/23/2014, 9:58 AM Pager: [phone]

## 2014-10-23 NOTE — Progress Notes (Signed)
Inpatient Diabetes Program Recommendations  AACE/ADA: New Consensus Statement on Inpatient Glycemic Control (2013)  Target Ranges:  Prepandial:   less than 140 mg/dL      Peak postprandial:   less than 180 mg/dL (1-2 hours)      Critically ill patients:  140 - 180 mg/dL     Results for [name] James, [name] James (MRN [id number]) as of 10/23/2014 09:22  Ref. Range 10/22/2014 07:55 10/22/2014 12:19 10/22/2014 17:00 10/22/2014 22:35  Glucose-Capillary Latest Range: 70-99 mg/dL 218 (H) 219 (H) 254 (H) 204 (H)    Results for [name] James, [name] James (MRN [id number]) as of 10/23/2014 09:22  Ref. Range 10/23/2014 08:14  Glucose-Capillary Latest Range: 70-99 mg/dL 205 (H)     MD- Please consider the following in-hospital insulin adjustments:  1. Increase Lantus to 20 units QHS 2. Add Novolog Meal Coverage- Novolog 4 units tid with meals     Will follow Wyn Quaker RN, MSN, CDE Diabetes Coordinator Inpatient Diabetes Program Team Pager: [phone] (8a-10p)

## 2014-10-23 NOTE — Progress Notes (Signed)
PT Cancellation Note  Patient Details Name: MICHAELE DICE MRN: [id number] DOB: 1936-03-16   Cancelled Treatment:    Reason Eval/Treat Not Completed: Patient not medically ready.  Can't catch her breath or phonate well.  Will check back as able 10/3. 10/23/2014  Donnella Sham, PT [phone]  (pager)   Allesandra Huebsch, Tessie Fass 10/23/2014, 3:41 PM

## 2014-10-23 NOTE — Clinical Social Work Note (Signed)
Per patient's request CSW attempted to reach the patient's daughter Kenney Houseman to inform of bed offers and discuss SNF placement with her. CSW unable to reach [facility] and left message. Per patient Kenney Houseman will come to the hospital this evening to see patient. CSW left the list of bed offers on window seal for daughter to review. CSW will follow for DC needs.  Liz Beach MSW, [place], [place], JI:[phone]

## 2014-10-23 NOTE — Clinical Social Work Note (Signed)
Fl2 in chart for MD signature.  Liz Beach MSW, [place], [place], [id number]

## 2014-10-23 NOTE — Progress Notes (Signed)
PROGRESS NOTE  [name] James [id number] DOB: 09-13-1936 DOA: 10/17/2014 PCP: Shirline Frees, MD  HPI/Recap of past 29 hours: 78 year old female with past medical history of stage III chronic kidney disease and baseline creatinine of 3 who had stable labs 1 month prior to presentation to the emergency room with several days of worsening shortness of breath and found to be in pulmonary edema with a creatinine of 4.    Patient's diuresis is around 6 L. Creatinine up, so Lasix stopped and gentle IV fluids started. She has now developed SOB Asking for pain meds  Assessment/Plan:    Acute diastolic CHF (congestive heart failure): diuresed 6 L. -weight appears to be increasing  SOB -chest x ray view    AKI (acute kidney injury) in the setting of chronic kidney disease stage III:  This is cardiorenal syndrome. Over diuresed and creatinine up to 5.  -U/S shows medical renal disease    HTN (hypertension): pressures doing much better. On scheduled hydralazine.    Type I diabetes mellitus with complication, uncontrolled: Continue sliding scale, CBGs overall under 200, continuing to titrate up Lantus    Dyslipidemia: Stable continue statin    Anemia: Secondary to chronic kidney disease.  -reports of dark stools earlier. Patient's baseline hemoglobin is around 10-11. Presented to the emergency room with a hemoglobin of near 10 and since that time has been slowly declining.  -given PRBC    Morbid obesity: Patient meets criteria with BMI greater than 40.  Encephalopathy: Resolved.   Code Status: Full code  Family Communication: left message with some  Disposition Plan: SNF once medically ready  Consultants:    Procedures:  None  Antibiotics:  None   Objective: BP 151/58 mmHg  Pulse 68  Temp(Src) 97.6 F (36.4 C) (Oral)  Resp 20  Ht 5\' 3"  (1.6 m)  Wt 113.535 kg (250 lb 4.8 oz)  BMI 44.35 kg/m2  SpO2 97%  Intake/Output Summary (Last 24 hours) at 10/23/14  1146 Last data filed at 10/23/14 1058  Gross per 24 hour  Intake 978.33 ml  Output    700 ml  Net 278.33 ml   Filed Weights   10/21/14 0645 10/22/14 0700 10/23/14 0515  Weight: 112.855 kg (248 lb 12.8 oz) 113.535 kg (250 lb 4.8 oz) 113.535 kg (250 lb 4.8 oz)    Exam:   General:  Pleasant/cooperative  Cardiovascular: Regular rate and rhythm, Q000111Q, 2/6 systolic ejection murmur  Respiratory: diminished b/l due to body habitus  Abdomen: Soft, obese, nontender, positive bowel sounds  Musculoskeletal: No clubbing or cyanosis, 2+ pitting edema from the knees down . No focal neurological deficits  Data Reviewed: Basic Metabolic Panel:  Recent Labs Lab 10/18/14 0727 10/19/14 0545 10/20/14 0415 10/21/14 0551 10/22/14 0530 10/23/14 0735  NA 142 143 145 144 142 143  K 3.3* 3.4* 3.7 4.2 4.2 4.7  CL 108 108 111 108 106 108  CO2 20 20 20 20 22 19   GLUCOSE 170* 160* 172* 178* 213* 223*  BUN 69* 74* 81* 90* 102* 105*  CREATININE 3.97* 4.03* 4.28* 4.46* 5.03* 4.71*  CALCIUM 8.0* 8.1* 7.9* 8.2* 8.1* 8.0*  PHOS 4.9*  --   --   --   --   --    Liver Function Tests:  Recent Labs Lab 10/17/14 1030 10/18/14 0727  AST 23  --   ALT 14  --   ALKPHOS 79  --   BILITOT <0.2*  --   PROT 6.2  --  ALBUMIN 2.3* 2.0*   No results for input(s): LIPASE, AMYLASE in the last 168 hours. No results for input(s): AMMONIA in the last 168 hours. CBC:  Recent Labs Lab 10/17/14 1030 10/18/14 0727 10/19/14 0545 10/21/14 0551 10/22/14 0530 10/22/14 1606 10/23/14 0735  WBC 10.8* 10.9* 11.5* 11.1* 10.4  --  12.1*  NEUTROABS 7.0  --   --   --   --   --   --   HGB 9.8* 8.9* 8.5* 8.1* 7.1* 6.8* 8.4*  HCT 30.5* 27.7* 26.0* 24.8* 21.9* 21.3* 26.0*  MCV 92.1 90.5 90.6 91.2 92.0  --  93.5  PLT 173 176 170 204 188  --  196   Cardiac Enzymes:    Recent Labs Lab 10/17/14 2256 10/18/14 0012 10/18/14 0727  TROPONINI <0.30 <0.30 <0.30   BNP (last 3 results)  Recent Labs  10/18/14 0727    PROBNP 3262.0*   CBG:  Recent Labs Lab 10/22/14 0755 10/22/14 1219 10/22/14 1700 10/22/14 2235 10/23/14 0814  GLUCAP 218* 219* 254* 204* 205*    Recent Results (from the past 240 hour(s))  Urine culture     Status: None   Collection Time: 10/17/14  2:54 PM  Result Value Ref Range Status   Specimen Description URINE, CLEAN CATCH  Final   Special Requests NONE  Final   Culture  Setup Time   Final    10/17/2014 21:07 Performed at [place] Performed at [facility]  Final   Culture NO GROWTH Performed at [facility]  Final   Report Status 10/18/2014 FINAL  Final     Studies: No results found.  Scheduled Meds: . aspirin EC  81 mg Oral Daily  . atenolol  50 mg Oral Daily  . dorzolamide-timolol  1 drop Left Eye BID  . enoxaparin (LOVENOX) injection  30 mg Subcutaneous Q24H  . hydrALAZINE  50 mg Oral 3 times per day  . insulin aspart  0-9 Units Subcutaneous TID WC  . insulin glargine  14 Units Subcutaneous QHS  . pantoprazole  40 mg Oral BID  . potassium chloride  20 mEq Oral Daily  . simvastatin  20 mg Oral QPM    Continuous Infusions:     Time spent: 25 minutes  Sameria Morss  [facility] Pager [phone]. If 7PM-7AM, please contact night-coverage at [email or web address], password [facility] 10/23/2014, 11:46 AM  LOS: 6 days

## 2014-10-24 ENCOUNTER — Encounter (HOSPITAL_COMMUNITY)
Admission: EM | Disposition: A | Discharge status: Nursing Home (Includes ICF) | Payer: Self-pay | Source: Home / Self Care | Attending: Internal Medicine

## 2014-10-24 ENCOUNTER — Encounter (HOSPITAL_COMMUNITY): Payer: Self-pay | Admitting: *Deleted

## 2014-10-24 DIAGNOSIS — K2990 Gastroduodenitis, unspecified, without bleeding: Secondary | ICD-10-CM

## 2014-10-24 DIAGNOSIS — K2970 Gastritis, unspecified, without bleeding: Secondary | ICD-10-CM | POA: Diagnosis present

## 2014-10-24 DIAGNOSIS — K209 Esophagitis, unspecified without bleeding: Secondary | ICD-10-CM | POA: Diagnosis present

## 2014-10-24 DIAGNOSIS — R609 Edema, unspecified: Secondary | ICD-10-CM

## 2014-10-24 DIAGNOSIS — R195 Other fecal abnormalities: Secondary | ICD-10-CM | POA: Diagnosis present

## 2014-10-24 HISTORY — PX: ESOPHAGOGASTRODUODENOSCOPY: SHX5428

## 2014-10-24 LAB — TSH: TSH: 2.3 u[IU]/mL (ref 0.350–4.500)

## 2014-10-24 LAB — TYPE AND SCREEN
ABO/RH(D): O POS
Antibody Screen: NEGATIVE
Unit division: 0

## 2014-10-24 LAB — GLUCOSE, CAPILLARY
Glucose-Capillary: 179 mg/dL — ABNORMAL HIGH (ref 70–99)
Glucose-Capillary: 176 mg/dL — ABNORMAL HIGH (ref 70–99)
Glucose-Capillary: 230 mg/dL — ABNORMAL HIGH (ref 70–99)
Glucose-Capillary: 269 mg/dL — ABNORMAL HIGH (ref 70–99)

## 2014-10-24 LAB — BASIC METABOLIC PANEL
ANION GAP: 16 — AB (ref 5–15)
BUN: 103 mg/dL — ABNORMAL HIGH (ref 6–23)
CALCIUM: 8.5 mg/dL (ref 8.4–10.5)
CO2: 19 meq/L (ref 19–32)
Chloride: 104 mEq/L (ref 96–112)
Creatinine, Ser: 4.75 mg/dL — ABNORMAL HIGH (ref 0.50–1.10)
GFR calc Af Amer: 9 mL/min — ABNORMAL LOW (ref >90)
GFR, EST NON AFRICAN AMERICAN: 8 mL/min — AB (ref >90)
Glucose, Bld: 186 mg/dL — ABNORMAL HIGH (ref 70–99)
Potassium: 4.5 mEq/L (ref 3.7–5.3)
SODIUM: 139 meq/L (ref 137–147)

## 2014-10-24 LAB — CBC
HCT: 26.7 % — ABNORMAL LOW (ref 36.0–46.0)
Hemoglobin: 8.5 g/dL — ABNORMAL LOW (ref 12.0–15.0)
MCH: 29.3 pg (ref 26.0–34.0)
MCHC: 31.8 g/dL (ref 30.0–36.0)
MCV: 92.1 fL (ref 78.0–100.0)
PLATELETS: 234 10*3/uL (ref 150–400)
RBC: 2.9 MIL/uL — ABNORMAL LOW (ref 3.87–5.11)
RDW: 19.2 % — AB (ref 11.5–15.5)
WBC: 9.5 10*3/uL (ref 4.0–10.5)

## 2014-10-24 SURGERY — EGD (ESOPHAGOGASTRODUODENOSCOPY)
Anesthesia: Moderate Sedation

## 2014-10-24 MED ORDER — FENTANYL CITRATE 0.05 MG/ML IJ SOLN
INTRAMUSCULAR | Status: AC
  Filled 2014-10-24: qty 2

## 2014-10-24 MED ORDER — FUROSEMIDE 10 MG/ML IJ SOLN
40.0000 mg | Freq: Two times a day (BID) | INTRAMUSCULAR | Status: DC
  Administered 2014-10-24 – 2014-10-25 (×2): 40 mg via INTRAVENOUS
  Filled 2014-10-24 (×4): qty 4

## 2014-10-24 MED ORDER — FENTANYL CITRATE 0.05 MG/ML IJ SOLN
INTRAMUSCULAR | Status: DC | PRN
  Administered 2014-10-24: 12.5 ug via INTRAVENOUS
  Administered 2014-10-24: 25 ug via INTRAVENOUS
  Administered 2014-10-24: 12.5 ug via INTRAVENOUS

## 2014-10-24 MED ORDER — BUTAMBEN-TETRACAINE-BENZOCAINE 2-2-14 % EX AERO
INHALATION_SPRAY | CUTANEOUS | Status: DC | PRN
  Administered 2014-10-24: 2 via TOPICAL

## 2014-10-24 MED ORDER — BISACODYL 10 MG RE SUPP
10.0000 mg | Freq: Once | RECTAL | Status: AC
  Administered 2014-10-24: 10 mg via RECTAL
  Filled 2014-10-24: qty 1

## 2014-10-24 MED ORDER — MIDAZOLAM HCL 10 MG/2ML IJ SOLN
INTRAMUSCULAR | Status: DC | PRN
  Administered 2014-10-24: 1 mg via INTRAVENOUS
  Administered 2014-10-24: 2 mg via INTRAVENOUS

## 2014-10-24 MED ORDER — MIDAZOLAM HCL 5 MG/ML IJ SOLN
INTRAMUSCULAR | Status: AC
  Filled 2014-10-24: qty 1

## 2014-10-24 MED ORDER — SODIUM CHLORIDE 0.9 % IV SOLN
INTRAVENOUS | Status: DC
  Administered 2014-10-24: 10:00:00 via INTRAVENOUS

## 2014-10-24 NOTE — Progress Notes (Signed)
Spoke with pt and dtr.  Examined pt, reviewed labs and vitals. Pt's resp status improved, less dyspnea and O2 sats imroved. Pt and her daughter comfortable with pursuing EGD set for 1030 AM today.   Azucena Freed PA-C

## 2014-10-24 NOTE — Op Note (Signed)
[facility] [place], [id number]   ENDOSCOPY PROCEDURE REPORT  PATIENT: [name], James  MR#: [id number] BIRTHDATE: 1936/10/09 , 72  yrs. old GENDER: female ENDOSCOPIST: Jerene Bears, MD REFERRED BY:  [facility] PROCEDURE DATE:  10/24/2014 PROCEDURE:  EGD w/ biopsy ASA CLASS:     Class III INDICATIONS:  melena, anemia, and heme positive stool. MEDICATIONS: Fentanyl 50 mcg IV and Versed 3 mg IV TOPICAL ANESTHETIC: Cetacaine Spray  DESCRIPTION OF PROCEDURE: After the risks benefits and alternatives of the procedure were thoroughly explained, informed consent was obtained.  The Pentax Gastroscope [id number] endoscope was introduced through the mouth and advanced to the second portion of the duodenum , Without limitations.  The instrument was slowly withdrawn as the mucosa was fully examined.   ESOPHAGUS: There was probable short segment Barrett's esophagus found 37 cm from the incisors.  Multiple biopsies were performed using cold forceps.  Sample obtained to rule out Barrett's esophagus.   Mild reflux esophagitis was found at the gastroesophageal junction.   No stricture or obstructing lesion.  STOMACH: Mild gastritis (inflammation) was found in the gastric antrum.  Cold forcep biopsies were taken at the gastric body, antrum and angularis to evaluate for h.  pylori.   There were a few small, benign, fundic gland appearing gastric polyps in the body of the stomach. One was sampled with cold forceps and sent to pathology.  Otherwise normal stomach.  DUODENUM: Moderate duodenal inflammation was found in the duodenal bulb.  Multiple biopsies taken. Examined portions of the second portion of the duodenum appeared normal.  Retroflexed views revealed no abnormalities.     The scope was then withdrawn from the patient and the procedure completed.  COMPLICATIONS: There were no immediate complications.  ENDOSCOPIC IMPRESSION: 1.   There  was possible short segment Barrett's esophagus found 37 cm from the incisors; multiple biopsies were performed 2.   Reflux esophagitis at the gastroesophageal junction 3.   Gastritis (inflammation) was found in the gastric antrum; a few benign appearing gastric polyps; multiple biopsies 4.   Duodenal inflammation was found in the duodenal bulb; multiple biopsies      RECOMMENDATIONS: 1.  Await biopsy results 2.  Daily PPI 3.  Follow-up of helicobacter pylori status, treat if indicated 4.  None 5.  Closely monitor Hgb 6.  Consider colonoscopy, though at present I do not think she can tolerate the preparation eSigned:  Jerene Bears, MD 10/24/2014 11:19 AM    CC:The Patient  PATIENT NAME:  [name], James MR#: [id number]

## 2014-10-24 NOTE — Progress Notes (Addendum)
Physical Therapy Treatment Patient Details Name: [name] James MRN: [id number] DOB: Aug 08, 1936 Today's Date: 10/24/2014    History of Present Illness   78 year old female with past medical history of stage III chronic kidney disease and baseline creatinine of 3 who had stable labs 1 month prior to presentation to the emergency room with several days of worsening shortness of breath and found to be in pulmonary edema with a creatinine of 4.    PT Comments    Pt currently with functional limitations due to decreased strength, endurance, mobility and balance deficits. Pt will benefit from skilled PT to increase her independence and safety with mobility to allow discharge to SNF with 24 hour assistance. Pt demonstrated increased mobility from previous session and was able to ambulate across room and back with one rest break in between. Pt requires 2L supplemental oxygen with ambulation to prevent desat.  Follow Up Recommendations        Equipment Recommendations  None recommended by PT    Recommendations for Other Services       Precautions / Restrictions Precautions Precautions: Fall Restrictions Weight Bearing Restrictions: No    Mobility  Bed Mobility Overal bed mobility: Needs Assistance Bed Mobility: Supine to Sit;Rolling Rolling: Min assist   Supine to sit: Mod assist     General bed mobility comments: Pt used bedrails to roll unto side and had HOB elevated. Pt required mod assist going from sideyling to sit with HOB elevated. Pt able to weight shift in bed to readjust after sitting and scoot forward to get feet on floor.   Transfers Overall transfer level: Needs assistance Equipment used: Rolling walker (2 wheeled) Transfers: Sit to/from [facility] Sit to Stand: Min assist Stand pivot transfers: Min assist       General transfer comment: Pt requires reminder for hand placement on stable surface with transfers. Pt able to push self up from sitting  on lower bed with min assist. Pt able to use RW safely to pivot.   Ambulation/Gait Ambulation/Gait assistance: Min assist Ambulation Distance (Feet): 20 Feet Assistive device: Rolling walker (2 wheeled) Gait Pattern/deviations: Decreased stride length   Gait velocity interpretation: Below normal speed for age/gender General Gait Details: Pt required cuing to keep tall posture with ambulation. Pt required break midway through ambulation due to fatigue and decreased SaO2 reading. Pt was on RA for first 10 feet, took a break where she was put back on 2L supplemental oxygen and her Sa)2 increased from 88-95%. Pt ambulated 10 feet back to chair on 2L O2 and Sa)2 remained at 95%.     Stairs            Wheelchair Mobility    Modified Rankin (Stroke Patients Only)       Balance Overall balance assessment: Needs assistance Sitting-balance support: Feet supported;No upper extremity supported Sitting balance-Leahy Scale: Fair                              Cognition Arousal/Alertness: Awake/alert Behavior During Therapy: WFL for tasks assessed/performed Overall Cognitive Status: Within Functional Limits for tasks assessed                      Exercises      General Comments General comments (skin integrity, edema, etc.): Pt was given medication at start of session by nurse and was encouraged to get up and sit in chair.  Pertinent Vitals/Pain Pain Assessment: No/denies pain  Ambulation on RA: 88-94% Ambulation on 2L supplemental O2: 95%    Home Living                      Prior Function            PT Goals (current goals can now be found in the care plan section)      Frequency  Min 3X/week    PT Plan Current plan remains appropriate    Co-evaluation             End of Session   Activity Tolerance: Patient tolerated treatment well;Patient limited by fatigue Patient left: in chair;with call bell/phone within reach;with  chair alarm set;with family/visitor present     Time: 1425-1454 PT Time Calculation (min): 29 min  Charges:  $Gait Training: 8-22 mins $Therapeutic Activity: 8-22 mins                    G Codes:      Georgeanne Frankland, Chrissie Noa V,SPT 10/24/2014, 4:11 PM

## 2014-10-24 NOTE — Progress Notes (Signed)
PT Cancellation Note  Patient Details Name: [name] James MRN: [id number] DOB: January 22, 1936   Cancelled Treatment:    Reason Eval/Treat Not Completed: Patient at procedure or test/unavailable.  Gone to EGD this am.  Will try back today as able. 10/24/2014  Donnella Sham, [place] [phone]  (pager)   Maryon Kemnitz, Tessie Fass 10/24/2014, 10:31 AM

## 2014-10-24 NOTE — Plan of Care (Signed)
Problem: Phase II Progression Outcomes Goal: Progress activity as tolerated unless otherwise ordered Outcome: Completed/Met Date Met:  10/24/14

## 2014-10-24 NOTE — Progress Notes (Signed)
PROGRESS NOTE  [name] James [id number] DOB: 03-28-1936 DOA: 10/17/2014 PCP: Shirline Frees, MD  HPI/Recap of past 28 hours: 78 year old female with past medical history of stage III chronic kidney disease and baseline creatinine of 3 who had stable labs 1 month prior to presentation to the emergency room with several days of worsening shortness of breath and found to be in pulmonary edema with a creatinine of 4.  hgb dropped and patient was given PRBC- EGD was done 11/3 IV lasix continued  Assessment/Plan:    Acute diastolic CHF (congestive heart failure): diuresed >6 L. -weight appears to be increasing  SOB -chest x ray: pleural effusions    AKI (acute kidney injury) in the setting of chronic kidney disease stage III:  This is cardiorenal syndrome. Over diuresed and creatinine up to 5.  -U/S shows medical renal disease -IV lasix    HTN (hypertension): pressures doing much better. On scheduled hydralazine.    Type I diabetes mellitus with complication, uncontrolled: Continue sliding scale, CBGs overall under 200, continuing to titrate up Lantus    Dyslipidemia: Stable continue statin    Anemia: Secondary to chronic kidney disease.  -reports of dark stools earlier. Patient's baseline hemoglobin is around 10-11. Presented to the emergency room with a hemoglobin of near 10 and since that time has been slowly declining.  -given PRBC -s/p EGD 11/3    Morbid obesity: Patient meets criteria with BMI greater than 40.  Encephalopathy: Resolved.   Code Status: Full code  Family Communication: daughter at bedside  Disposition Plan: SNF once medically ready  Consultants:  GI  Procedures:  EGD  Antibiotics:  None   Objective: BP 133/42 mmHg  Pulse 61  Temp(Src) 97.5 F (36.4 C) (Oral)  Resp 16  Ht 5\' 3"  (1.6 m)  Wt 114.034 kg (251 lb 6.4 oz)  BMI 44.54 kg/m2  SpO2 99%  Intake/Output Summary (Last 24 hours) at 10/24/14 1226 Last data filed at 10/24/14  1100  Gross per 24 hour  Intake    250 ml  Output    751 ml  Net   -501 ml   Filed Weights   10/22/14 0700 10/23/14 0515 10/24/14 0509  Weight: 113.535 kg (250 lb 4.8 oz) 113.535 kg (250 lb 4.8 oz) 114.034 kg (251 lb 6.4 oz)    Exam:   General:  sleepy  Cardiovascular: Regular rate and rhythm, Q000111Q, 2/6 systolic ejection murmur  Respiratory: diminished b/l due to body habitus  Abdomen: Soft, obese, nontender, positive bowel sounds  Musculoskeletal: No clubbing or cyanosis, 2+ pitting edema from the knees down . No focal neurological deficits  Data Reviewed: Basic Metabolic Panel:  Recent Labs Lab 10/18/14 0727  10/20/14 0415 10/21/14 0551 10/22/14 0530 10/23/14 0735 10/24/14 0920  NA 142  < > 145 144 142 143 139  K 3.3*  < > 3.7 4.2 4.2 4.7 4.5  CL 108  < > 111 108 106 108 104  CO2 20  < > 20 20 22 19 19   GLUCOSE 170*  < > 172* 178* 213* 223* 186*  BUN 69*  < > 81* 90* 102* 105* 103*  CREATININE 3.97*  < > 4.28* 4.46* 5.03* 4.71* 4.75*  CALCIUM 8.0*  < > 7.9* 8.2* 8.1* 8.0* 8.5  PHOS 4.9*  --   --   --   --   --   --   < > = values in this interval not displayed. Liver Function Tests:  Recent Labs Lab 10/18/14  0727  ALBUMIN 2.0*   No results for input(s): LIPASE, AMYLASE in the last 168 hours. No results for input(s): AMMONIA in the last 168 hours. CBC:  Recent Labs Lab 10/19/14 0545 10/21/14 0551 10/22/14 0530 10/22/14 1606 10/23/14 0735 10/24/14 0920  WBC 11.5* 11.1* 10.4  --  12.1* 9.5  HGB 8.5* 8.1* 7.1* 6.8* 8.4* 8.5*  HCT 26.0* 24.8* 21.9* 21.3* 26.0* 26.7*  MCV 90.6 91.2 92.0  --  93.5 92.1  PLT 170 204 188  --  196 234   Cardiac Enzymes:    Recent Labs Lab 10/17/14 2256 10/18/14 0012 10/18/14 0727  TROPONINI <0.30 <0.30 <0.30   BNP (last 3 results)  Recent Labs  10/18/14 0727  PROBNP 3262.0*   CBG:  Recent Labs Lab 10/23/14 1149 10/23/14 1733 10/23/14 2206 10/24/14 0825 10/24/14 1158  GLUCAP 254* 173* 141* 179*  176*    Recent Results (from the past 240 hour(s))  Urine culture     Status: None   Collection Time: 10/17/14  2:54 PM  Result Value Ref Range Status   Specimen Description URINE, CLEAN CATCH  Final   Special Requests NONE  Final   Culture  Setup Time   Final    10/17/2014 21:07 Performed at [place] Performed at [facility]  Final   Culture NO GROWTH Performed at [facility]  Final   Report Status 10/18/2014 FINAL  Final     Studies: Dg Chest 2 View  10/23/2014   CLINICAL DATA:  Persistent shortness of breath today. History congestive heart failure and atypical chest pain as well as renal insufficiency.  EXAM: CHEST  2 VIEW  COMPARISON:  10/17/2014.  FINDINGS: Hazy opacity at the lung bases obscures hemidiaphragms is due to moderate left and small right pleural effusions with associated dependent lung base opacity, which is likely atelectasis. There is no convincing pulmonary edema or pneumonia.  Cardiac silhouette is top-normal in size. No mediastinal or hilar masses. No pneumothorax.  Bony thorax is demineralized but intact.  IMPRESSION: Moderate left and small right pleural effusions with associated atelectasis. No convincing pneumonia or pulmonary edema.   Electronically Signed   By: Lajean Manes M.D.   On: 10/23/2014 18:23    Scheduled Meds: . aspirin EC  81 mg Oral Daily  . atenolol  50 mg Oral Daily  . dorzolamide-timolol  1 drop Left Eye BID  . enoxaparin (LOVENOX) injection  30 mg Subcutaneous Q24H  . hydrALAZINE  50 mg Oral 3 times per day  . insulin aspart  0-9 Units Subcutaneous TID WC  . insulin aspart  4 Units Subcutaneous TID WC  . insulin glargine  20 Units Subcutaneous QHS  . pantoprazole  40 mg Oral BID  . potassium chloride  20 mEq Oral Daily  . simvastatin  20 mg Oral QPM    Continuous Infusions:     Time spent: 25 minutes  VANN, JESSICA  [facility] Pager [phone]. If 7PM-7AM, please  contact night-coverage at [email or web address], password [facility] 10/24/2014, 12:26 PM  LOS: 7 days

## 2014-10-24 NOTE — Progress Notes (Signed)
Paged NP Baltazar Najjar, spoke with PA concerning hydralazine parameters. Told to give medication based on SBP parameters alone at this point. Will continue to monitor.

## 2014-10-25 ENCOUNTER — Encounter (HOSPITAL_COMMUNITY): Payer: Self-pay | Admitting: Internal Medicine

## 2014-10-25 DIAGNOSIS — D509 Iron deficiency anemia, unspecified: Secondary | ICD-10-CM | POA: Diagnosis present

## 2014-10-25 LAB — BASIC METABOLIC PANEL
Anion gap: 15 (ref 5–15)
BUN: 105 mg/dL — ABNORMAL HIGH (ref 6–23)
CALCIUM: 8.4 mg/dL (ref 8.4–10.5)
CO2: 20 mEq/L (ref 19–32)
CREATININE: 4.74 mg/dL — AB (ref 0.50–1.10)
Chloride: 108 mEq/L (ref 96–112)
GFR calc Af Amer: 9 mL/min — ABNORMAL LOW (ref >90)
GFR calc non Af Amer: 8 mL/min — ABNORMAL LOW (ref >90)
GLUCOSE: 169 mg/dL — AB (ref 70–99)
Potassium: 4.7 mEq/L (ref 3.7–5.3)
Sodium: 143 mEq/L (ref 137–147)

## 2014-10-25 LAB — CBC
HCT: 24.5 % — ABNORMAL LOW (ref 36.0–46.0)
HEMOGLOBIN: 7.9 g/dL — AB (ref 12.0–15.0)
MCH: 29.7 pg (ref 26.0–34.0)
MCHC: 32.2 g/dL (ref 30.0–36.0)
MCV: 92.1 fL (ref 78.0–100.0)
Platelets: 230 10*3/uL (ref 150–400)
RBC: 2.66 MIL/uL — ABNORMAL LOW (ref 3.87–5.11)
RDW: 18.9 % — ABNORMAL HIGH (ref 11.5–15.5)
WBC: 9.8 10*3/uL (ref 4.0–10.5)

## 2014-10-25 LAB — GLUCOSE, CAPILLARY
GLUCOSE-CAPILLARY: 167 mg/dL — AB (ref 70–99)
GLUCOSE-CAPILLARY: 216 mg/dL — AB (ref 70–99)
GLUCOSE-CAPILLARY: 180 mg/dL — AB (ref 70–99)
Glucose-Capillary: 235 mg/dL — ABNORMAL HIGH (ref 70–99)

## 2014-10-25 MED ORDER — ALPRAZOLAM 0.25 MG PO TABS
0.2500 mg | ORAL_TABLET | Freq: Every day | ORAL | Status: DC
  Administered 2014-10-25 – 2014-10-30 (×6): 0.25 mg via ORAL
  Filled 2014-10-25 (×7): qty 1

## 2014-10-25 MED ORDER — FUROSEMIDE 10 MG/ML IJ SOLN
60.0000 mg | Freq: Two times a day (BID) | INTRAMUSCULAR | Status: DC
  Administered 2014-10-25 – 2014-10-27 (×4): 60 mg via INTRAVENOUS
  Filled 2014-10-25 (×5): qty 6

## 2014-10-25 MED ORDER — POLYETHYLENE GLYCOL 3350 17 G PO PACK
17.0000 g | PACK | Freq: Every day | ORAL | Status: DC | PRN
  Filled 2014-10-25 (×2): qty 1

## 2014-10-25 MED ORDER — DOCUSATE SODIUM 100 MG PO CAPS
100.0000 mg | ORAL_CAPSULE | Freq: Two times a day (BID) | ORAL | Status: DC
  Administered 2014-10-25 – 2014-10-26 (×3): 100 mg via ORAL
  Filled 2014-10-25 (×4): qty 1

## 2014-10-25 MED ORDER — ALPRAZOLAM 0.25 MG PO TABS
0.2500 mg | ORAL_TABLET | Freq: Two times a day (BID) | ORAL | Status: DC | PRN
  Administered 2014-10-25 – 2014-10-30 (×4): 0.25 mg via ORAL
  Filled 2014-10-25 (×7): qty 1

## 2014-10-25 MED ORDER — PANTOPRAZOLE SODIUM 40 MG PO TBEC
40.0000 mg | DELAYED_RELEASE_TABLET | Freq: Every day | ORAL | Status: DC
  Administered 2014-10-26 – 2014-11-02 (×8): 40 mg via ORAL
  Filled 2014-10-25 (×8): qty 1

## 2014-10-25 NOTE — Progress Notes (Signed)
PROGRESS NOTE  [name] James [id number] DOB: May 26, 1936 DOA: 10/17/2014 PCP: Shirline Frees, MD  HPI/Recap of past 48 hours: 78 year old female with past medical history of stage III chronic kidney disease and baseline creatinine of 3 who had stable labs 1 month prior to presentation to the emergency room with several days of worsening shortness of breath and found to be in pulmonary edema with a creatinine of 4.  hgb dropped and patient was given PRBC- EGD was done 11/3 IV lasix continued Still with feeling of SOB despite clear lung- small pleural effusions- increase lasix to BID  Assessment/Plan:    Acute diastolic CHF (congestive heart failure): diuresed >6 L. -weight appears to be increasing -lasix BID IV  SOB -chest x ray: pleural effusions- recheck in AM to see if diuresed more    AKI (acute kidney injury) in the setting of chronic kidney disease stage III:  This is cardiorenal syndrome. Over diuresed and creatinine up to 5.  -U/S shows medical renal disease -IV lasix    HTN (hypertension): pressures doing much better. On scheduled hydralazine.    Type I diabetes mellitus with complication, uncontrolled: Continue sliding scale, CBGs overall under 200, continuing to titrate up Lantus    Dyslipidemia: Stable continue statin    Anemia: Secondary to chronic kidney disease.  -reports of dark stools earlier. Patient's baseline hemoglobin is around 10-11. Presented to the emergency room with a hemoglobin of near 10 and since that time has been slowly declining.  -given PRBC -s/p EGD 11/3    Morbid obesity: Patient meets criteria with BMI greater than 40.  Constipation -suppository -miralax  Encephalopathy: Resolved.   Code Status: Full code  Family Communication: daughter at bedside 11/3  Disposition Plan: SNF once medically ready  Consultants:  GI  Procedures:  EGD     Objective: BP 149/57 mmHg  Pulse 63  Temp(Src) 97.6 F (36.4 C) (Oral)   Resp 18  Ht 5\' 3"  (1.6 m)  Wt 114.76 kg (253 lb)  BMI 44.83 kg/m2  SpO2 100%  Intake/Output Summary (Last 24 hours) at 10/25/14 1047 Last data filed at 10/24/14 2000  Gross per 24 hour  Intake    250 ml  Output    200 ml  Net     50 ml   Filed Weights   10/23/14 0515 10/24/14 0509 10/25/14 0500  Weight: 113.535 kg (250 lb 4.8 oz) 114.034 kg (251 lb 6.4 oz) 114.76 kg (253 lb)    Exam:   General:  Awake, able to carry on conversation with out breathing difficulty  Cardiovascular: Regular rate and rhythm, Q000111Q, 2/6 systolic ejection murmur  Respiratory: diminished b/l due to body habitus  Abdomen: Soft, obese, nontender, positive bowel sounds  Musculoskeletal: No clubbing or cyanosis, 2+ pitting edema from the knees down . No focal neurological deficits  Data Reviewed: Basic Metabolic Panel:  Recent Labs Lab 10/21/14 0551 10/22/14 0530 10/23/14 0735 10/24/14 0920 10/25/14 0500  NA 144 142 143 139 143  K 4.2 4.2 4.7 4.5 4.7  CL 108 106 108 104 108  CO2 20 22 19 19 20   GLUCOSE 178* 213* 223* 186* 169*  BUN 90* 102* 105* 103* 105*  CREATININE 4.46* 5.03* 4.71* 4.75* 4.74*  CALCIUM 8.2* 8.1* 8.0* 8.5 8.4   Liver Function Tests: No results for input(s): AST, ALT, ALKPHOS, BILITOT, PROT, ALBUMIN in the last 168 hours. No results for input(s): LIPASE, AMYLASE in the last 168 hours. No results for input(s): AMMONIA in the  last 168 hours. CBC:  Recent Labs Lab 10/21/14 0551 10/22/14 0530 10/22/14 1606 10/23/14 0735 10/24/14 0920 10/25/14 0500  WBC 11.1* 10.4  --  12.1* 9.5 9.8  HGB 8.1* 7.1* 6.8* 8.4* 8.5* 7.9*  HCT 24.8* 21.9* 21.3* 26.0* 26.7* 24.5*  MCV 91.2 92.0  --  93.5 92.1 92.1  PLT 204 188  --  196 234 230   Cardiac Enzymes:   No results for input(s): CKTOTAL, CKMB, CKMBINDEX, TROPONINI in the last 168 hours. BNP (last 3 results)  Recent Labs  10/18/14 0727  PROBNP 3262.0*   CBG:  Recent Labs Lab 10/24/14 0825 10/24/14 1158  10/24/14 1703 10/24/14 2232 10/25/14 0755  GLUCAP 179* 176* 230* 269* 167*    Recent Results (from the past 240 hour(s))  Urine culture     Status: None   Collection Time: 10/17/14  2:54 PM  Result Value Ref Range Status   Specimen Description URINE, CLEAN CATCH  Final   Special Requests NONE  Final   Culture  Setup Time   Final    10/17/2014 21:07 Performed at [place] Performed at [facility]  Final   Culture NO GROWTH Performed at [facility]  Final   Report Status 10/18/2014 FINAL  Final     Studies: No results found.  Scheduled Meds: . aspirin EC  81 mg Oral Daily  . atenolol  50 mg Oral Daily  . docusate sodium  100 mg Oral BID  . dorzolamide-timolol  1 drop Left Eye BID  . enoxaparin (LOVENOX) injection  30 mg Subcutaneous Q24H  . furosemide  40 mg Intravenous BID  . hydrALAZINE  50 mg Oral 3 times per day  . insulin aspart  0-9 Units Subcutaneous TID WC  . insulin aspart  4 Units Subcutaneous TID WC  . insulin glargine  20 Units Subcutaneous QHS  . [START ON 10/26/2014] pantoprazole  40 mg Oral Daily  . potassium chloride  20 mEq Oral Daily  . simvastatin  20 mg Oral QPM    Continuous Infusions:     Time spent: 25 minutes  VANN, JESSICA  [facility] Pager [phone]. If 7PM-7AM, please contact night-coverage at [email or web address], password [facility] 10/25/2014, 10:47 AM  LOS: 8 days

## 2014-10-25 NOTE — Progress Notes (Signed)
Daily Rounding Note  10/25/2014, 9:54 AM  LOS: 8 days   SUBJECTIVE:       Feels SOB at rest, not coughing.  Feels weak.  No nausea.  Appetite poor.   OBJECTIVE:         Vital signs in last 24 hours:    Temp:  [97 F (36.1 C)-97.6 F (36.4 C)] 97.6 F (36.4 C) (11/04 0556) Pulse Rate:  [60-65] 63 (11/04 0556) Resp:  [15-29] 18 (11/04 0556) BP: (133-166)/(41-64) 149/57 mmHg (11/04 0556) SpO2:  [97 %-100 %] 100 % (11/04 0556) Weight:  [253 lb (114.76 kg)] 253 lb (114.76 kg) (11/04 0500) Last BM Date: 10/24/14 Filed Weights   10/23/14 0515 10/24/14 0509 10/25/14 0500  Weight: 250 lb 4.8 oz (113.535 kg) 251 lb 6.4 oz (114.034 kg) 253 lb (114.76 kg)   General: dyspneic, looks ill, comfortable   Heart: RRR Chest: rales bil, at least 1/2 way up.  No cough.  SOB at rest and with speach Abdomen: soft, obese, NT, ND.  BS active  Extremities: hematoma/purpura on both arms Neuro/Psych:  Pleasant, oriented x 3.  Appropriate.   Intake/Output from previous day: 11/03 0701 - 11/04 0700 In: 250 [I.V.:250] Out: 200 [Urine:200]  Intake/Output this shift:    Lab Results:  Recent Labs  10/23/14 0735 10/24/14 0920 10/25/14 0500  WBC 12.1* 9.5 9.8  HGB 8.4* 8.5* 7.9*  HCT 26.0* 26.7* 24.5*  PLT 196 234 230   BMET  Recent Labs  10/23/14 0735 10/24/14 0920 10/25/14 0500  NA 143 139 143  K 4.7 4.5 4.7  CL 108 104 108  CO2 19 19 20   GLUCOSE 223* 186* 169*  BUN 105* 103* 105*  CREATININE 4.71* 4.75* 4.74*  CALCIUM 8.0* 8.5 8.4    Studies/Results: Dg Chest 2 View  10/23/2014   CLINICAL DATA:  Persistent shortness of breath today. History congestive heart failure and atypical chest pain as well as renal insufficiency.  EXAM: CHEST  2 VIEW  COMPARISON:  10/17/2014.  FINDINGS: Hazy opacity at the lung bases obscures hemidiaphragms is due to moderate left and small right pleural effusions with associated dependent lung  base opacity, which is likely atelectasis. There is no convincing pulmonary edema or pneumonia.  Cardiac silhouette is top-normal in size. No mediastinal or hilar masses. No pneumothorax.  Bony thorax is demineralized but intact.  IMPRESSION: Moderate left and small right pleural effusions with associated atelectasis. No convincing pneumonia or pulmonary edema.   Electronically Signed   By: Lajean Manes M.D.   On: 10/23/2014 18:23   Scheduled Meds: . aspirin EC  81 mg Oral Daily  . atenolol  50 mg Oral Daily  . dorzolamide-timolol  1 drop Left Eye BID  . enoxaparin (LOVENOX) injection  30 mg Subcutaneous Q24H  . furosemide  40 mg Intravenous BID  . hydrALAZINE  50 mg Oral 3 times per day  . insulin aspart  0-9 Units Subcutaneous TID WC  . insulin aspart  4 Units Subcutaneous TID WC  . insulin glargine  20 Units Subcutaneous QHS  . pantoprazole  40 mg Oral BID  . potassium chloride  20 mEq Oral Daily  . simvastatin  20 mg Oral QPM   Continuous Infusions:  PRN Meds:.acetaminophen, hydrALAZINE, ondansetron (ZOFRAN) IV, oxyCODONE  ASSESMENT:   *  Upper GIB, dark FOBT+ stool and anemia.  S/p 1 unit PRBCs.  11/3 EGD with short segment Barretts (biops path pending). Reflux  esophagitis, gastritis, duodenitis (biopsy path pending)  *  Dyspnea, sounds like CHF on lung exam.     PLAN   *  Stay on Protonix once daily (not taking PTA) *  Will sign off.  If respiratory and functional status improve, consider outpt colonoscopy but at present doubt she will reach a point where this is safe.  Can follow up with Dr Olevia Perches or GI APP one month after discharge. Will add meds if Hpylori is +     Azucena Freed  10/25/2014, 9:54 AM Pager: [phone]

## 2014-10-25 NOTE — Progress Notes (Signed)
Inpatient Diabetes Program Recommendations  AACE/ADA: New Consensus Statement on Inpatient Glycemic Control (2013)  Target Ranges:  Prepandial:   less than 140 mg/dL      Peak postprandial:   less than 180 mg/dL (1-2 hours)      Critically ill patients:  140 - 180 mg/dL     Results for [name] James, [name] James (MRN [id number]) as of 10/25/2014 09:32  Ref. Range 10/24/2014 08:25 10/24/2014 11:58 10/24/2014 17:03 10/24/2014 22:32  Glucose-Capillary Latest Range: 70-99 mg/dL 179 (H) 176 (H) 230 (H) 269 (H)    Results for [name] James, [name] James (MRN [id number]) as of 10/25/2014 09:32  Ref. Range 10/25/2014 07:55  Glucose-Capillary Latest Range: 70-99 mg/dL 167 (H)     Fasting and Postprandial glucose levels still elevated.   MD- Please consider the following in-hospital insulin adjustments:  1. Increase Lantus by 20% to Lantus 24 units QHS 2. Increase Novolog Meal Coverage to 5 units tid with meals    Will follow Wyn Quaker RN, MSN, CDE Diabetes Coordinator Inpatient Diabetes Program Team Pager: [phone] (8a-10p)

## 2014-10-25 NOTE — Plan of Care (Signed)
Problem: Consults Goal: General Medical Patient Education See Patient Education Module for specific education.  Outcome: Progressing     

## 2014-10-26 ENCOUNTER — Inpatient Hospital Stay (HOSPITAL_COMMUNITY): Payer: Medicare Other

## 2014-10-26 LAB — URINALYSIS W MICROSCOPIC (NOT AT ARMC)
Bilirubin Urine: NEGATIVE
Glucose, UA: 100 mg/dL — AB
KETONES UR: NEGATIVE mg/dL
NITRITE: POSITIVE — AB
Protein, ur: 100 mg/dL — AB
Specific Gravity, Urine: 1.011 (ref 1.005–1.030)
UROBILINOGEN UA: 0.2 mg/dL (ref 0.0–1.0)
pH: 5 (ref 5.0–8.0)

## 2014-10-26 LAB — GLUCOSE, CAPILLARY
GLUCOSE-CAPILLARY: 207 mg/dL — AB (ref 70–99)
GLUCOSE-CAPILLARY: 226 mg/dL — AB (ref 70–99)
Glucose-Capillary: 191 mg/dL — ABNORMAL HIGH (ref 70–99)
Glucose-Capillary: 182 mg/dL — ABNORMAL HIGH (ref 70–99)
Glucose-Capillary: 216 mg/dL — ABNORMAL HIGH (ref 70–99)

## 2014-10-26 LAB — BASIC METABOLIC PANEL
ANION GAP: 13 (ref 5–15)
Anion gap: 19 — ABNORMAL HIGH (ref 5–15)
BUN: 106 mg/dL — ABNORMAL HIGH (ref 6–23)
BUN: 101 mg/dL — ABNORMAL HIGH (ref 6–23)
CALCIUM: 8.5 mg/dL (ref 8.4–10.5)
CO2: 17 mEq/L — ABNORMAL LOW (ref 19–32)
CO2: 22 meq/L (ref 19–32)
Calcium: 8.2 mg/dL — ABNORMAL LOW (ref 8.4–10.5)
Chloride: 108 mEq/L (ref 96–112)
Chloride: 104 mEq/L (ref 96–112)
Creatinine, Ser: 4.64 mg/dL — ABNORMAL HIGH (ref 0.50–1.10)
Creatinine, Ser: 4.53 mg/dL — ABNORMAL HIGH (ref 0.50–1.10)
GFR calc Af Amer: 10 mL/min — ABNORMAL LOW (ref >90)
GFR calc non Af Amer: 8 mL/min — ABNORMAL LOW (ref >90)
GFR calc non Af Amer: 9 mL/min — ABNORMAL LOW (ref >90)
GFR, EST AFRICAN AMERICAN: 10 mL/min — AB (ref >90)
GLUCOSE: 185 mg/dL — AB (ref 70–99)
Glucose, Bld: 218 mg/dL — ABNORMAL HIGH (ref 70–99)
POTASSIUM: 4.8 meq/L (ref 3.7–5.3)
POTASSIUM: 4.7 meq/L (ref 3.7–5.3)
Sodium: 144 mEq/L (ref 137–147)
Sodium: 139 mEq/L (ref 137–147)

## 2014-10-26 LAB — CREATININE, URINE, RANDOM

## 2014-10-26 LAB — CBC
HCT: 24 % — ABNORMAL LOW (ref 36.0–46.0)
HEMOGLOBIN: 7.6 g/dL — AB (ref 12.0–15.0)
MCH: 29.8 pg (ref 26.0–34.0)
MCHC: 31.7 g/dL (ref 30.0–36.0)
MCV: 94.1 fL (ref 78.0–100.0)
Platelets: 238 10*3/uL (ref 150–400)
RBC: 2.55 MIL/uL — ABNORMAL LOW (ref 3.87–5.11)
RDW: 19.2 % — ABNORMAL HIGH (ref 11.5–15.5)
WBC: 8.8 10*3/uL (ref 4.0–10.5)

## 2014-10-26 LAB — SODIUM, URINE, RANDOM: SODIUM UR: 92 meq/L

## 2014-10-26 MED ORDER — NYSTATIN 100000 UNIT/GM EX CREA
TOPICAL_CREAM | Freq: Two times a day (BID) | CUTANEOUS | Status: DC
  Administered 2014-10-26 – 2014-11-01 (×12): via TOPICAL
  Administered 2014-11-01: 1 via TOPICAL
  Administered 2014-11-02: 10:00:00 via TOPICAL
  Filled 2014-10-26 (×3): qty 15

## 2014-10-26 MED ORDER — SENNOSIDES-DOCUSATE SODIUM 8.6-50 MG PO TABS
1.0000 | ORAL_TABLET | Freq: Every day | ORAL | Status: DC
  Administered 2014-10-26 – 2014-11-01 (×7): 1 via ORAL
  Filled 2014-10-26 (×7): qty 1

## 2014-10-26 MED ORDER — DARBEPOETIN ALFA 100 MCG/0.5ML IJ SOSY
100.0000 ug | PREFILLED_SYRINGE | INTRAMUSCULAR | Status: DC
  Administered 2014-10-27: 100 ug via SUBCUTANEOUS
  Filled 2014-10-26 (×2): qty 0.5

## 2014-10-26 NOTE — Plan of Care (Signed)
Problem: Consults Goal: General Medical Patient Education See Patient Education Module for specific education.  Outcome: Progressing     

## 2014-10-26 NOTE — Progress Notes (Signed)
Physical Therapy Treatment Patient Details Name: [name] James MRN: [id number] DOB: 1936-09-14 Today's Date: 10/26/2014    History of Present Illness 78 yo female with onset CHF and EF 55%, DM and claudication issues to reduce LE edema.      PT Comments    Pt currently with functional limitations due to decreased strength, decreased endurance and decreased mobility. Pt will benefit from continued skilled PT to increase independence and safety with mobility to allow discharge to SNF with 24 hour assistance. Pt with increased fatigue during today's session than previous sessions. Pt not wanting to ambulate but would transfer into chair. Pt able to safely transfer into chair with min physical assistance from PT but requiring cues for hand placement and management of supplemental O2 tubing.    Follow Up Recommendations  SNF;Supervision/Assistance - 24 hour     Equipment Recommendations  None recommended by PT    Recommendations for Other Services       Precautions / Restrictions Precautions Precautions: Fall Restrictions Weight Bearing Restrictions: No    Mobility  Bed Mobility                  Transfers Overall transfer level: Needs assistance Equipment used: Rolling walker (2 wheeled) Transfers: Sit to/from [facility] Sit to Stand: Min assist Stand pivot transfers: Min assist       General transfer comment: Pt requires assistance for hand placement when transferring and min physical assistance for sit to stand.   Ambulation/Gait                 Stairs            Wheelchair Mobility    Modified Rankin (Stroke Patients Only)       Balance Overall balance assessment: Needs assistance Sitting-balance support: Feet supported;Bilateral upper extremity supported Sitting balance-Leahy Scale: Fair Sitting balance - Comments: Pt able to perform LE exercises sitting EOB without PT assistance                              Cognition Arousal/Alertness: Awake/alert Behavior During Therapy: WFL for tasks assessed/performed                        Exercises General Exercises - Lower Extremity Ankle Circles/Pumps: AROM;10 reps;Both;Seated Long Arc Quad: AROM;Both;10 reps;Seated Hip Flexion/Marching: AROM;10 reps;Both;Seated    General Comments General comments (skin integrity, edema, etc.): Pt found sitting on EOB with nursing staff after using bed pan. Doctor came in during session to speak with Pt.       Pertinent Vitals/Pain Pain Assessment: No/denies pain    Home Living                      Prior Function            PT Goals (current goals can now be found in the care plan section) Progress towards PT goals: Not progressing toward goals - comment (Pt with increased fatigue today;Not feeling up to ambulating)    Frequency  Min 3X/week    PT Plan Current plan remains appropriate    Co-evaluation             End of Session Equipment Utilized During Treatment: Gait belt;Oxygen Activity Tolerance: Patient limited by fatigue Patient left: in chair;with call bell/phone within reach     Time: 1156-1218 PT Time Calculation (min): 22 min  Charges:  G CodesJearld Shines, SPT  10/26/2014, 1:32 PM

## 2014-10-26 NOTE — Progress Notes (Signed)
Paged NP Schorr concerning b/p this am. B/p medication was held. Will continue to monitor

## 2014-10-26 NOTE — Progress Notes (Signed)
[facility] PROGRESS NOTE  [name] James [id number] DOB: 1936-03-10 DOA: 10/17/2014 PCP: Shirline Frees, MD  Assessment/Plan: 78 y/o female with PMH of HTN, CKD, CHF, DM presented with SOB, DOE, edema  1. Acute on chronic diastolic CHF likely worse with CKD/fluid overlaod:  -CXR: BL effusions edema; diuresed >6 L but still edematous, SOB; worsening renal function -will consult nephrology for HD eval if not progress with diuresis   2. AKI on CKD IV; renal function worsened, Pt is still edematous -cont lasix; consult nephrology for HD eval   3. HTN (hypertension): cont current regimen  4. Type I diabetes mellitus with complication, uncontrolled: unclear history  -cont insulin regimen; obtain HA1c 5. Anemia, AoCD/kidney disease with UGIB;  -s/p EGD (11/3): Reflux esophagitis, gastritis, duodenitis; cont PPI -TF sed 1 unit; monitor Hg; TF prn;    Code Status: full Family Communication: d/w patient, no family at the bedside  (indicate person spoken with, relationship, and if by phone, the number) Disposition Plan: pend clinical improvement    Consultants:  Nephrology,. GI  Procedures:  EGD  Antibiotics:  none (indicate start date, and stop date if known)  HPI/Subjective: alert  Objective: Filed Vitals:   10/26/14 0518  BP: 146/48  Pulse: 63  Temp: 97.7 F (36.5 C)  Resp: 16    Intake/Output Summary (Last 24 hours) at 10/26/14 1237 Last data filed at 10/26/14 1045  Gross per 24 hour  Intake    580 ml  Output   1250 ml  Net   -670 ml   Filed Weights   10/24/14 0509 10/25/14 0500 10/26/14 0341  Weight: 114.034 kg (251 lb 6.4 oz) 114.76 kg (253 lb) 115.214 kg (254 lb)    Exam:   General:  alert  Cardiovascular: s1,s2 rrr  Respiratory: BL crackles   Abdomen: soft, nt,nd   Musculoskeletal: LE edema   Data Reviewed: Basic Metabolic Panel:  Recent Labs Lab 10/22/14 0530 10/23/14 0735 10/24/14 0920 10/25/14 0500 10/26/14 0544  NA  142 143 139 143 144  K 4.2 4.7 4.5 4.7 4.8  CL 106 108 104 108 108  CO2 22 19 19 20  17*  GLUCOSE 213* 223* 186* 169* 185*  BUN 102* 105* 103* 105* 106*  CREATININE 5.03* 4.71* 4.75* 4.74* 4.64*  CALCIUM 8.1* 8.0* 8.5 8.4 8.2*   Liver Function Tests: No results for input(s): AST, ALT, ALKPHOS, BILITOT, PROT, ALBUMIN in the last 168 hours. No results for input(s): LIPASE, AMYLASE in the last 168 hours. No results for input(s): AMMONIA in the last 168 hours. CBC:  Recent Labs Lab 10/22/14 0530 10/22/14 1606 10/23/14 0735 10/24/14 0920 10/25/14 0500 10/26/14 0544  WBC 10.4  --  12.1* 9.5 9.8 8.8  HGB 7.1* 6.8* 8.4* 8.5* 7.9* 7.6*  HCT 21.9* 21.3* 26.0* 26.7* 24.5* 24.0*  MCV 92.0  --  93.5 92.1 92.1 94.1  PLT 188  --  196 234 230 238   Cardiac Enzymes: No results for input(s): CKTOTAL, CKMB, CKMBINDEX, TROPONINI in the last 168 hours. BNP (last 3 results)  Recent Labs  10/18/14 0727  PROBNP 3262.0*   CBG:  Recent Labs Lab 10/25/14 1717 10/25/14 2250 10/26/14 0201 10/26/14 0745 10/26/14 1223  GLUCAP 216* 180* 207* 191* 226*    Recent Results (from the past 240 hour(s))  Urine culture     Status: None   Collection Time: 10/17/14  2:54 PM  Result Value Ref Range Status   Specimen Description URINE, CLEAN CATCH  Final   Special  Requests NONE  Final   Culture  Setup Time   Final    10/17/2014 21:07 Performed at [place] Performed at [facility]  Final   Culture NO GROWTH Performed at [facility]  Final   Report Status 10/18/2014 FINAL  Final     Studies: Dg Chest 2 View  10/26/2014   CLINICAL DATA:  Shortness of breath.  EXAM: CHEST  2 VIEW  COMPARISON:  October 23, 2014.  FINDINGS: Stable cardiomediastinal silhouette. Stable bilateral pleural effusions are noted with left greater than right. No pneumothorax is noted. Bony thorax appears intact.  IMPRESSION: Stable bilateral pleural effusions with  probable underlying subsegmental atelectasis.   Electronically Signed   By: Sabino Dick M.D.   On: 10/26/2014 08:43    Scheduled Meds: . ALPRAZolam  0.25 mg Oral QHS  . aspirin EC  81 mg Oral Daily  . atenolol  50 mg Oral Daily  . docusate sodium  100 mg Oral BID  . dorzolamide-timolol  1 drop Left Eye BID  . enoxaparin (LOVENOX) injection  30 mg Subcutaneous Q24H  . furosemide  60 mg Intravenous BID  . hydrALAZINE  50 mg Oral 3 times per day  . insulin aspart  0-9 Units Subcutaneous TID WC  . insulin aspart  4 Units Subcutaneous TID WC  . insulin glargine  20 Units Subcutaneous QHS  . pantoprazole  40 mg Oral Daily  . potassium chloride  20 mEq Oral Daily  . simvastatin  20 mg Oral QPM   Continuous Infusions:   Principal Problem:   Acute on chronic diastolic CHF (congestive heart failure) Active Problems:   AKI (acute kidney injury)   HTN (hypertension)   CKD (chronic kidney disease), stage III   Type I diabetes mellitus with complication, uncontrolled   Dyslipidemia   Anemia   Morbid obesity   Edema   Acute encephalopathy   Acute esophagitis   Gastritis and gastroduodenitis   Heme positive stool   Absolute anemia    Time spent: >35 minutes     Kinnie Feil  [facility] Pager [phone]. If 7PM-7AM, please contact night-coverage at [email or web address], password [facility] 10/26/2014, 12:37 PM  LOS: 9 days

## 2014-10-26 NOTE — Consult Note (Signed)
Reason for Consult: AKI on CKD Referring Physician: Geradine Girt, DO  HPI: [name] James is an 78 y.o. female with PMH CKD stage 4, HTN, DM, HFpEF, anemia . Admitted on 10/17/2014 for exertional dyspnea, worsening lower extremity swelling, weakness  2 weeks and black stools (Hgb 9.8) w/ FOBT +. Found to have worsened kidney fcn w/ Cr 4.03 (BUN/Cr 17); last known Cr 2.3 on 09/20/14. She was presumed to have AKI on CKD and IV diuresis initiated. Her UOP has decrease with continued diuresis and Cr increased to 4.64. She admits to loss of appetite over the past several weeks and previous talks with her Nephrologist about dialysis.   She was noted to see Dr. Florene Glen in September of this year and creatinine was 2.3.  She was noted to have pitting edema at the time.  During the hospitalization here, she has been diuresed, then hydrated, then diuresed with minimal changes to creatinine over the last several days and now possibly with uremic symptoms.  She appears pale and weak as well as volume overloaded at this time.  She appears to be GI bleeding but has been told is too unstable for invasive management at this time.   Trend in Creatinine: CREAT  Date/Time Value Ref Range Status  04/02/2014 09:07 AM 1.86* 0.50 - 1.10 mg/dL Final  03/16/2014 08:07 PM 2.09* 0.50 - 1.10 mg/dL Final  01/16/2014 12:56 PM 2.70* 0.50 - 1.10 mg/dL Final   CREATININE, SER  Date/Time Value Ref Range Status  10/26/2014 05:44 AM 4.64* 0.50 - 1.10 mg/dL Final  10/25/2014 05:00 AM 4.74* 0.50 - 1.10 mg/dL Final  10/24/2014 09:20 AM 4.75* 0.50 - 1.10 mg/dL Final  10/23/2014 07:35 AM 4.71* 0.50 - 1.10 mg/dL Final  10/22/2014 05:30 AM 5.03* 0.50 - 1.10 mg/dL Final  10/21/2014 05:51 AM 4.46* 0.50 - 1.10 mg/dL Final  10/20/2014 04:15 AM 4.28* 0.50 - 1.10 mg/dL Final  10/19/2014 05:45 AM 4.03* 0.50 - 1.10 mg/dL Final  10/18/2014 07:27 AM 3.97* 0.50 - 1.10 mg/dL Final  10/17/2014 10:30 AM 4.03* 0.50 - 1.10 mg/dL Final  09/06/2008  03:52 PM 1.52* 0.40 - 1.20 mg/dL Final  11/30/2007 03:36 PM 1.90* 0.40 - 1.20 mg/dL Final  06/10/2007 10:37 AM 2.30* 0.40 - 1.20 mg/dL Final  12/17/2006 12:32 PM 2.53* 0.40 - 1.20 mg/dL Final    Comment:    Result repeated and verified.  07/17/2006 12:20 PM 1.89* 0.40 - 1.20 mg/dL Final    Comment:    Result repeated and verified.    PMH:   Past Medical History  Diagnosis Date  . CHF (congestive heart failure) 05/13/2011    echo - [id number]; stage 1 diastolic dysfunction; elevated LV filling pressure, MAC  . Chest pain, atypical 12/05/2008    R/Lmv- normal perfusion all regions, noe ECG changes   . Renal insufficiency 10/30/2005    doppler - abn resistance consistent w/ parenchymal disease  . Claudication 10/30/2005    doppler - normal evaluation, no evidence of aneurysm, diameter reduction, dissection, compression or vascular abnormality)  . DM (diabetes mellitus)   . Peripheral neuropathy   . Hypertension   . Edema   . Gout   . Anxiety   . Arthritis   . Cataract   . GERD (gastroesophageal reflux disease)   . Breast cancer 1980s    bil mastectomies, no radiation or chemo    PSH:   Past Surgical History  Procedure Laterality Date  . Mastectomy Bilateral ~1982    bil breast  reconstruction with implants  . Back surgery  ~1980  . Hemorrhoid surgery    . Cholecystectomy    . Eye surgery    . Orif tibia & fibula fractures Left 2007    also had left non displaced malleolar fracture.   . Esophagogastroduodenoscopy N/A 10/24/2014    Procedure: ESOPHAGOGASTRODUODENOSCOPY (EGD);  Surgeon: Jerene Bears, MD;  Location: [facility] ENDOSCOPY;  Service: Endoscopy;  Laterality: N/A;    Allergies:  Allergies  Allergen Reactions  . Erythromycin Swelling  . Penicillins     She has taken keflex many times without porblems  . Codeine Rash    Medications:   Prior to Admission medications   Medication Sig Start Date End Date Taking? Authorizing Provider  allopurinol (ZYLOPRIM) 100 MG tablet  Take 100 mg by mouth 2 (two) times daily.   Yes Historical Provider, MD  aspirin EC 81 MG tablet Take 81 mg by mouth daily.   Yes Historical Provider, MD  atenolol (TENORMIN) 50 MG tablet Take 50 mg by mouth daily.   Yes Historical Provider, MD  brimonidine (ALPHAGAN) 0.2 % ophthalmic solution 3 (three) times daily.   Yes Historical Provider, MD  diazepam (VALIUM) 5 MG tablet Take 5 mg by mouth every 6 (six) hours as needed for anxiety.    Yes Historical Provider, MD  dorzolamide-timolol (COSOPT) 22.3-6.8 MG/ML ophthalmic solution 1 drop 2 (two) times daily.   Yes Historical Provider, MD  furosemide (LASIX) 40 MG tablet Take 40 mg by mouth 2 (two) times daily.   Yes Historical Provider, MD  glipiZIDE (GLUCOTROL) 10 MG tablet Take 10 mg by mouth 2 (two) times daily before a meal.   Yes Historical Provider, MD  insulin NPH-insulin regular (NOVOLIN 70/30) (70-30) 100 UNIT/ML injection Inject 64-68 Units into the skin 2 (two) times daily with a meal.    Yes Historical Provider, MD  Insulin Syringe-Needle U-100 30G X 1/2" 1 ML MISC by Does not apply route.   Yes Historical Provider, MD  ranitidine (ZANTAC) 150 MG capsule Take 150 mg by mouth 2 (two) times daily.   Yes Historical Provider, MD  simvastatin (ZOCOR) 20 MG tablet Take 20 mg by mouth every evening.   Yes Historical Provider, MD  sitaGLIPtin (JANUVIA) 100 MG tablet Take 100 mg by mouth daily.   Yes Historical Provider, MD    Inpatient medications: . ALPRAZolam  0.25 mg Oral QHS  . aspirin EC  81 mg Oral Daily  . atenolol  50 mg Oral Daily  . dorzolamide-timolol  1 drop Left Eye BID  . enoxaparin (LOVENOX) injection  30 mg Subcutaneous Q24H  . furosemide  60 mg Intravenous BID  . hydrALAZINE  50 mg Oral 3 times per day  . insulin aspart  0-9 Units Subcutaneous TID WC  . insulin aspart  4 Units Subcutaneous TID WC  . insulin glargine  20 Units Subcutaneous QHS  . nystatin cream   Topical BID  . pantoprazole  40 mg Oral Daily  .  potassium chloride  20 mEq Oral Daily  . senna-docusate  1 tablet Oral QHS  . simvastatin  20 mg Oral QPM    Discontinued Meds:   Medications Discontinued During This Encounter  Medication Reason  . losartan (COZAAR) 50 MG tablet Change in therapy  . atenolol (TENORMIN) 25 MG tablet Change in therapy  . cephALEXin (KEFLEX) 250 MG capsule Completed Course  . ciprofloxacin (CIPRO) 500 MG tablet Completed Course  . 0.9 %  sodium chloride infusion   .  0.9 %  sodium chloride infusion   . pantoprazole (PROTONIX) EC tablet 40 mg   . heparin injection 5,000 Units   . brimonidine (ALPHAGAN) 0.2 % ophthalmic solution 1 drop   . dorzolamide-timolol (COSOPT) 22.3-6.8 MG/ML ophthalmic solution 1 drop   . 0.9 %  sodium chloride infusion   . sodium chloride 0.9 % injection 3 mL   . sodium chloride 0.9 % injection 3 mL   . insulin glargine (LANTUS) injection 10 Units   . hydrALAZINE (APRESOLINE) tablet 25 mg   . pantoprazole (PROTONIX) injection 40 mg   . hydrALAZINE (APRESOLINE) injection 10 mg   . diazepam (VALIUM) tablet 5 mg   . insulin glargine (LANTUS) injection 12 Units   . furosemide (LASIX) injection 80 mg   . famotidine (PEPCID) tablet 10 mg   . 0.9 %  sodium chloride infusion   . insulin glargine (LANTUS) injection 14 Units   . midazolam (VERSED) injection Patient Discharge  . fentaNYL (SUBLIMAZE) injection Patient Discharge  . butamben-tetracaine-benzocaine (CETACAINE) spray Patient Discharge  . 0.9 %  sodium chloride infusion   . pantoprazole (PROTONIX) EC tablet 40 mg   . furosemide (LASIX) injection 40 mg   . docusate sodium (COLACE) capsule 100 mg     Social History:  reports that she has never smoked. She does not have any smokeless tobacco history on file. She reports that she does not drink alcohol. Her drug history is not on file.  Family History:   Family History  Problem Relation Age of Onset  . Cancer Mother   . Diabetes Mother   . Heart disease Mother   . Heart  attack Father   . Stroke Father   . Parkinson's disease Brother   . Cancer Sister     Weight change: 1 lb (0.454 kg)  Intake/Output Summary (Last 24 hours) at 10/26/14 1309 Last data filed at 10/26/14 1045  Gross per 24 hour  Intake    340 ml  Output   1250 ml  Net   -910 ml   BP 146/48 mmHg  Pulse 63  Temp(Src) 97.7 F (36.5 C) (Oral)  Resp 16  Ht 5\' 3"  (1.6 m)  Wt 254 lb (115.214 kg)  BMI 45.01 kg/m2  SpO2 100% Filed Vitals:   10/25/14 2202 10/26/14 0341 10/26/14 0516 10/26/14 0518  BP: 119/51  148/46 146/48  Pulse: 65   63  Temp: 97.5 F (36.4 C)   97.7 F (36.5 C)  TempSrc: Oral   Oral  Resp: 16   16  Height:      Weight:  254 lb (115.214 kg)    SpO2: 100%   100%     Gen: Obese, resting comfortable in chair CV: RRR no m/r/g; LE edema to mid calf Resp: Decreased breath sounds b/l; no wheezes or crackles  Ab: SNTND- obese Pitting lower extremity edema  Labs: Basic Metabolic Panel:  Recent Labs Lab 10/20/14 0415 10/21/14 0551 10/22/14 0530 10/23/14 0735 10/24/14 0920 10/25/14 0500 10/26/14 0544  NA 145 144 142 143 139 143 144  K 3.7 4.2 4.2 4.7 4.5 4.7 4.8  CL 111 108 106 108 104 108 108  CO2 20 20 22 19 19 20  17*  GLUCOSE 172* 178* 213* 223* 186* 169* 185*  BUN 81* 90* 102* 105* 103* 105* 106*  CREATININE 4.28* 4.46* 5.03* 4.71* 4.75* 4.74* 4.64*  CALCIUM 7.9* 8.2* 8.1* 8.0* 8.5 8.4 8.2*   Liver Function Tests: No results for input(s): AST, ALT, ALKPHOS,  BILITOT, PROT, ALBUMIN in the last 168 hours. No results for input(s): LIPASE, AMYLASE in the last 168 hours. No results for input(s): AMMONIA in the last 168 hours. CBC:  Recent Labs Lab 10/23/14 0735 10/24/14 0920 10/25/14 0500 10/26/14 0544  WBC 12.1* 9.5 9.8 8.8  HGB 8.4* 8.5* 7.9* 7.6*  HCT 26.0* 26.7* 24.5* 24.0*  MCV 93.5 92.1 92.1 94.1  PLT 196 234 230 238   CBG:  Recent Labs Lab 10/25/14 1717 10/25/14 2250 10/26/14 0201 10/26/14 0745 10/26/14 1223  GLUCAP 216* 180*  207* 191* 226*    Iron Studies: No results for input(s): IRON, TIBC, TRANSFERRIN, FERRITIN in the last 168 hours.  Xrays/Other Studies: Dg Chest 2 View  10/26/2014   CLINICAL DATA:  Shortness of breath.  EXAM: CHEST  2 VIEW  COMPARISON:  October 23, 2014.  FINDINGS: Stable cardiomediastinal silhouette. Stable bilateral pleural effusions are noted with left greater than right. No pneumothorax is noted. Bony thorax appears intact.  IMPRESSION: Stable bilateral pleural effusions with probable underlying subsegmental atelectasis.   Electronically Signed   By: Sabino Dick M.D.   On: 10/26/2014 08:43   Background 78 y.o F w/ PMH of CKD stage 3, HFpEF (EF60% w/ stage 1 diastolic dysfunction) on 123456, DM, HTN, and anemia who presented on 10/17/14 with exertional dyspnea, worsening lower extremity swelling, weakness 2 weeks and black stools (Hgb 9.8); FOBT +. Found to have AKI on CKD w/ Cr 4.03 (BUN/Cr 17) Last known Cr ~ 2.3 (09/20/14). UA with moderate Hgb and protein. Renal US showed cortical thinning consistent with medical renal disease and bilateral renal cysts. CXR (10/27) without pulmonary edema; CXR (11/5) Stable bilateral pleural effusions.   Assessment/Plan: 1. CKD: Cr 4.03 on admit with last know Cr ~ 2.3 on 08/2014. Possible AKI vs this is her new baseline kidney fcn. Cr has fluctuated since admit but no significant change. No acidosis or electrolyte abnormality requiring urgent dialysis. Continue Lasix 60 mg IV BID for now; Have discussed dialysis with patient and will discuss with her Nephrologist (Dr Florene Glen) and her daughter.  1. Checking FENa and Pr/Cr 2. Repeat UA 2. Anemia: Hgb 9.8 on admit with Hx of melena and +FOBT on admit. Ferritin 41 with iron, TIBC, B12 and Folate wnl. EGD on 11/3 showed possible short segment Barrett's esophagus, esophagitis and gastritis. 2u pRBC given on 11/1 -> Hgb 8.4 trending down 7.6 (11/5). GI consulted.  1. Recheck Iron studies: Will consider Iron  replacement pending results 3. HTN: Stable. Primary managing on Atenolol 50mg  qd, Lasix 60mg  IV BID Hydralazine prn 4. DM: SSI  5. HLD: Zocor   Phill Myron 10/26/2014, 1:09 PM   Patient seen and examined, agree with above note with above modifications. Frail 78 year old WF with multiple medical issues including CKD- now presenting with what appears to be a GI bleed and volume overload.  Her renal function has worsened and is tolerating the medical therapies which are required.  I cannot identify any specific renal toxins.  Her renal ultrasound only consistent with medical renal disease. She appears uremic at this time and this is not likely to come to a quick resolution.  I have started the discussions as far as dialysis.  She seems very frail to me and I am not sure she would be the best dialysis candidate.  I have discussed this with the daughter who has some kind of medical background and appeared to understand what I was saying.  She said it would  be up to the patient to decide.  For now would continue diuresis- will check iron stores and replete as needed and also add aranesp  Corliss Parish, MD 10/26/2014

## 2014-10-27 LAB — CBC
HCT: 24.7 % — ABNORMAL LOW (ref 36.0–46.0)
Hemoglobin: 8 g/dL — ABNORMAL LOW (ref 12.0–15.0)
MCH: 29.9 pg (ref 26.0–34.0)
MCHC: 32.4 g/dL (ref 30.0–36.0)
MCV: 92.2 fL (ref 78.0–100.0)
PLATELETS: 256 10*3/uL (ref 150–400)
RBC: 2.68 MIL/uL — AB (ref 3.87–5.11)
RDW: 18.8 % — AB (ref 11.5–15.5)
WBC: 9.7 10*3/uL (ref 4.0–10.5)

## 2014-10-27 LAB — BASIC METABOLIC PANEL
ANION GAP: 16 — AB (ref 5–15)
BUN: 105 mg/dL — ABNORMAL HIGH (ref 6–23)
CHLORIDE: 107 meq/L (ref 96–112)
CO2: 21 mEq/L (ref 19–32)
Calcium: 8.2 mg/dL — ABNORMAL LOW (ref 8.4–10.5)
Creatinine, Ser: 4.48 mg/dL — ABNORMAL HIGH (ref 0.50–1.10)
GFR calc non Af Amer: 9 mL/min — ABNORMAL LOW (ref >90)
GFR, EST AFRICAN AMERICAN: 10 mL/min — AB (ref >90)
Glucose, Bld: 165 mg/dL — ABNORMAL HIGH (ref 70–99)
Potassium: 4.6 mEq/L (ref 3.7–5.3)
Sodium: 144 mEq/L (ref 137–147)

## 2014-10-27 LAB — HEMOGLOBIN A1C
HEMOGLOBIN A1C: 6.4 % — AB (ref <5.7)
MEAN PLASMA GLUCOSE: 137 mg/dL — AB (ref <117)

## 2014-10-27 LAB — GLUCOSE, CAPILLARY
GLUCOSE-CAPILLARY: 213 mg/dL — AB (ref 70–99)
Glucose-Capillary: 168 mg/dL — ABNORMAL HIGH (ref 70–99)
Glucose-Capillary: 259 mg/dL — ABNORMAL HIGH (ref 70–99)
Glucose-Capillary: 169 mg/dL — ABNORMAL HIGH (ref 70–99)

## 2014-10-27 LAB — PROTEIN, URINE, RANDOM: Total Protein, Urine: 236.2 mg/dL

## 2014-10-27 LAB — FERRITIN: Ferritin: 29 ng/mL (ref 10–291)

## 2014-10-27 LAB — IRON AND TIBC
IRON: 25 ug/dL — AB (ref 42–135)
Saturation Ratios: 8 % — ABNORMAL LOW (ref 20–55)
TIBC: 328 ug/dL (ref 250–470)
UIBC: 303 ug/dL (ref 125–400)

## 2014-10-27 MED ORDER — FUROSEMIDE 10 MG/ML IJ SOLN
80.0000 mg | Freq: Two times a day (BID) | INTRAMUSCULAR | Status: DC
  Administered 2014-10-27 – 2014-10-28 (×2): 80 mg via INTRAVENOUS
  Filled 2014-10-27 (×3): qty 8

## 2014-10-27 MED ORDER — SODIUM CHLORIDE 0.9 % IV SOLN
510.0000 mg | INTRAVENOUS | Status: AC
  Administered 2014-10-27 – 2014-10-30 (×2): 510 mg via INTRAVENOUS
  Filled 2014-10-27 (×3): qty 17

## 2014-10-27 MED ORDER — LEVOFLOXACIN IN D5W 250 MG/50ML IV SOLN
250.0000 mg | INTRAVENOUS | Status: DC
  Administered 2014-10-27: 250 mg via INTRAVENOUS
  Filled 2014-10-27 (×2): qty 50

## 2014-10-27 NOTE — Clinical Social Work Placement (Signed)
Clinical Social Work Department CLINICAL SOCIAL WORK PLACEMENT NOTE 10/27/2014  Patient:  COUNTESS, DEWEY  Account Number:  [id number] Admit date:  10/17/2014  Clinical Social Worker:  Carrington Clamp, LCSWA  Date/time:  10/22/2014 08:58 PM  Clinical Social Work is seeking post-discharge placement for this patient at the following level of care:   [place]   (*CSW will update this form in Epic as items are completed)   10/22/2014  Patient/family provided with [facility] Department of Clinical Social Work's list of facilities offering this level of care within the geographic area requested by the patient (or if unable, by the patient's family).  10/22/2014  Patient/family informed of their freedom to choose among providers that offer the needed level of care, that participate in Medicare, Medicaid or managed care program needed by the patient, have an available bed and are willing to accept the patient.  10/22/2014  Patient/family informed of [facility]' ownership interest in [facility], as well as of the fact that they are under no obligation to receive care at this facility.  PASARR submitted to EDS on  PASARR number received on   FL2 transmitted to all facilities in geographic area requested by pt/family on  10/22/2014 FL2 transmitted to all facilities within larger geographic area on   Patient informed that his/her managed care company has contracts with or will negotiate with  certain facilities, including the following:     Patient/family informed of bed offers received:  10/23/2014 Patient chooses bed at [place] Physician recommends and patient chooses bed at    Patient to be transferred to [place] on   Patient to be transferred to facility by  Patient and family notified of transfer on  Name of family member notified:    The following physician request were entered in Epic:   Additional  Comments:    Liz Beach MSW, [place], [place], [id number]

## 2014-10-27 NOTE — Progress Notes (Signed)
Subjective:  Continues to feel weak.  Report no change in SOB, but has notice itching in her legs for the past several days. Kermit Balo UOP/neg balance last 24 hours - on IV lasix She is "still thinking about" whether she would do dialysis or not - wants to think about it through the weekend. Daughter Lavella Lemons says she thinks pt leaning toward dialysis if/when needed   Objective Vital signs in last 24 hours: Filed Vitals:   10/26/14 0518 10/26/14 1320 10/26/14 2224 10/27/14 0426  BP: 146/48 154/45 161/49 155/46  Pulse: 63 59 71 68  Temp: 97.7 F (36.5 C) 97.4 F (36.3 C) 97.5 F (36.4 C) 97.9 F (36.6 C)  TempSrc: Oral Oral Oral Oral  Resp: 16 18 18 18   Height:      Weight:    259 lb 12.8 oz (117.845 kg)  SpO2: 100% 100% 100% 100%   Weight change: 5 lb 12.8 oz (2.631 kg)  Intake/Output Summary (Last 24 hours) at 10/27/14 0718 Last data filed at 10/27/14 0521  Gross per 24 hour  Intake    120 ml  Output   1500 ml  Net  -1380 ml  Weight Trending 10/17/14 113.3 kg 10/20/14 110.4 kg (nadir weight this admit) 10/27/14 117.8 kg    Physical Exam:  BP 134/53 mmHg  Pulse 61  Temp(Src) 97.4 F (36.3 C) (Oral)  Resp 18  Ht 5\' 3"  (1.6 m)  Wt 117.845 kg (259 lb 12.8 oz)  BMI 46.03 kg/m2  SpO2 100%  Gen: Obese, resting comfortable in chair presently lying in bed CV: RRR no m/r/g Resp: Decreased breath sounds in lower lobes b/l; no wheezes or crackles appreciated few exp wheezes Ab: SNTND- obese Pitting lower extremity edema to knee and at hips Foley with yellow urine  Labs: Basic Metabolic Panel:  Recent Labs Lab 10/22/14 0530 10/23/14 0735 10/24/14 0920 10/25/14 0500 10/26/14 0544 10/26/14 1525 10/27/14 0435  NA 142 143 139 143 144 139 144  K 4.2 4.7 4.5 4.7 4.8 4.7 4.6  CL 106 108 104 108 108 104 107  CO2 22 19 19 20  17* 22 21  GLUCOSE 213* 223* 186* 169* 185* 218* 165*  BUN 102* 105* 103* 105* 106* 101* 105*  CREATININE 5.03* 4.71* 4.75* 4.74* 4.64* 4.53* 4.48*   CALCIUM 8.1* 8.0* 8.5 8.4 8.2* 8.5 8.2*    Recent Labs Lab 10/24/14 0920 10/25/14 0500 10/26/14 0544 10/27/14 0435  WBC 9.5 9.8 8.8 9.7  HGB 8.5* 7.9* 7.6* 8.0*  HCT 26.7* 24.5* 24.0* 24.7*  MCV 92.1 92.1 94.1 92.2  PLT 234 230 238 256    Recent Labs Lab 10/26/14 0201 10/26/14 0745 10/26/14 1223 10/26/14 1706 10/26/14 2221  GLUCAP 207* 191* 226* 182* 216*   Lab Results  Component Value Date   IRON 25* 10/27/2014   TIBC 328 10/27/2014   FERRITIN 29 10/27/2014   Studies/Results: Dg Chest 2 View  10/26/2014   CLINICAL DATA:  Shortness of breath.  EXAM: CHEST  2 VIEW  COMPARISON:  October 23, 2014.  FINDINGS: Stable cardiomediastinal silhouette. Stable bilateral pleural effusions are noted with left greater than right. No pneumothorax is noted. Bony thorax appears intact.  IMPRESSION: Stable bilateral pleural effusions with probable underlying subsegmental atelectasis.   Electronically Signed   By: Sabino Dick M.D.   On: 10/26/2014 08:43   Medications:   . ALPRAZolam  0.25 mg Oral QHS  . aspirin EC  81 mg Oral Daily  . atenolol  50  mg Oral Daily  . darbepoetin (ARANESP) injection - NON-DIALYSIS  100 mcg Subcutaneous Q Fri-1800  . dorzolamide-timolol  1 drop Left Eye BID  . enoxaparin (LOVENOX) injection  30 mg Subcutaneous Q24H  . furosemide  60 mg Intravenous BID  . hydrALAZINE  50 mg Oral 3 times per day  . insulin aspart  0-9 Units Subcutaneous TID WC  . insulin aspart  4 Units Subcutaneous TID WC  . insulin glargine  20 Units Subcutaneous QHS  . nystatin cream   Topical BID  . pantoprazole  40 mg Oral Daily  . potassium chloride  20 mEq Oral Daily  . senna-docusate  1 tablet Oral QHS  . simvastatin  20 mg Oral QPM    I  have reviewed scheduled and prn medications. Background 78 y.o F w/ PMH of CKD stage 3, HFpEF (EF60% w/ stage 1 diastolic dysfunction) on 123456, DM, HTN, and anemia who presented on 10/17/14 with exertional dyspnea, worsening lower  extremity swelling, weakness 2 weeks and black stools (Hgb 9.8); FOBT +.- She appears volume overload with what appears to be a GI bleed but has been told is too unstable for invasive management at this time.  Found to worsening CKD w/ Cr 4.03 (BUN/Cr 17) Last known Cr ~ 2.3 (09/20/14). Her renal ultrasound only consistent with medical renal disease. She is azotemic but does not appear to be clinically uremic at this time.   This is not likely to come to a quick resolution. Discussions about dialysis have been started. No urgent indications at this time  Assessment/Plan: 1. CKD: Cr 4.03 on admit with last know Cr ~ 2.3 on 08/2014. Possible AKI vs this is her new baseline kidney fcn. Cr has fluctuated since admit with diuresis and IVF but no significant changes. Weight is actually up 7 kg from her nadir weight this admit and appears volume overloaded to me.  No acidosis or electrolyte abnormality requiring urgent dialysis. Continue Lasix but increase dose to 80 mg every 12 hours.   Have discussed dialysis with patient and will discuss with her Nephrologist (Dr Florene Glen) and her daughter. UOP 0.5 cc/kg/hr in last 24hrs;Cr 4.48 (11/6) w/ Bicarb and K wnl. Plan for today is to talk with Ms Dorf, her daughter and Dr Florene Glen about potential dialysis they are still in "decision mode" 1. FENa ~ 30 2. Repeat UA: + Pro (100), Nit, Leu, Hgb (small) Many Bact & Grandular cast 2. Anemia: Hgb 9.8 on admit with Hx of melena and +FOBT on admit. Ferritin 41 with iron, TIBC, B12 and Folate wnl. EGD on 11/3 showed possible short segment Barrett's esophagus, esophagitis and gastritis. 2u pRBC given on 11/1 -> Hgb 8.4 trending down 7.6 (11/5). Stable at 8 (11/6) GI consulted.  1. Recheck Iron studies: Will consider Iron replacement pending results Iron studies with tsat pf 8% - dose Feraheme 510 IV X 2 doses 3. HTN: Stable. Primary managing on Atenolol 50mg  qd, Lasix 60mg  IV BID Hydralazine prn 4. DM: SSI  5. HLD:  Zocor  Olam Idler, MD 10/27/2014, 7:18 AM PGY-2, [place]  I have seen and examined this patient and agree with plan and assessment in the note of Dr. Berkley Harvey with highlighted additions.  Appears wet to me, good but not great UOP on current lasix - will increase dose a bit. She is still thinking about dialysis (future) but wants to think on it over the weekend.  Spoke with patient and daughter.  Hb stable so  appears no actively bleeding. TSat quite low ->IV feraheme X 2.    Seon Gaertner B,MD 10/27/2014 1:38 PM

## 2014-10-27 NOTE — Progress Notes (Signed)
[facility] PROGRESS NOTE  [name] James [id number] DOB: Sep 07, 1936 DOA: 10/17/2014 PCP: Shirline Frees, MD  Assessment/Plan: 78 y/o female with PMH of HTN, CKD, CHF, DM presented with SOB, DOE, edema  1. Acute on chronic diastolic CHF likely worse with CKD/fluid overlaod:  -CXR: BL effusions edema; diuresed >8 L but still edematous, probable uremia developing  -per nephrology ongoing discussions with patient, family about HD; appreciate the input    2. AKI on CKD IV; renal function worsened, Pt is still edematous -cont lasix; as above 3. HTN (hypertension): cont current regimen  4. Type I diabetes mellitus with complication, uncontrolled: unclear history  -cont insulin regimen; obtain HA1c-6.4 5. Anemia, AoCD/kidney disease with new UGIB;  -s/p EGD (11/3): Reflux esophagitis, gastritis, duodenitis; cont PPI;  -TF sed 1 unit; monitor Hg; TF prn;  6. Probable UTI, start Iv atx; obtain urine cultures    Code Status: full Family Communication: d/w patient, no family at the bedside  (indicate person spoken with, relationship, and if by phone, the number) Disposition Plan: pend clinical improvement    Consultants:  Nephrology,. GI  Procedures:  EGD  Antibiotics:  none (indicate start date, and stop date if known)  HPI/Subjective: alert  Objective: Filed Vitals:   10/27/14 1119  BP: 148/50  Pulse: 65  Temp:   Resp:     Intake/Output Summary (Last 24 hours) at 10/27/14 1151 Last data filed at 10/27/14 0937  Gross per 24 hour  Intake    200 ml  Output   1425 ml  Net  -1225 ml   Filed Weights   10/25/14 0500 10/26/14 0341 10/27/14 0426  Weight: 114.76 kg (253 lb) 115.214 kg (254 lb) 117.845 kg (259 lb 12.8 oz)    Exam:   General:  alert  Cardiovascular: s1,s2 rrr  Respiratory: BL crackles   Abdomen: soft, nt,nd   Musculoskeletal: LE edema   Data Reviewed: Basic Metabolic Panel:  Recent Labs Lab 10/24/14 0920 10/25/14 0500  10/26/14 0544 10/26/14 1525 10/27/14 0435  NA 139 143 144 139 144  K 4.5 4.7 4.8 4.7 4.6  CL 104 108 108 104 107  CO2 19 20 17* 22 21  GLUCOSE 186* 169* 185* 218* 165*  BUN 103* 105* 106* 101* 105*  CREATININE 4.75* 4.74* 4.64* 4.53* 4.48*  CALCIUM 8.5 8.4 8.2* 8.5 8.2*   Liver Function Tests: No results for input(s): AST, ALT, ALKPHOS, BILITOT, PROT, ALBUMIN in the last 168 hours. No results for input(s): LIPASE, AMYLASE in the last 168 hours. No results for input(s): AMMONIA in the last 168 hours. CBC:  Recent Labs Lab 10/23/14 0735 10/24/14 0920 10/25/14 0500 10/26/14 0544 10/27/14 0435  WBC 12.1* 9.5 9.8 8.8 9.7  HGB 8.4* 8.5* 7.9* 7.6* 8.0*  HCT 26.0* 26.7* 24.5* 24.0* 24.7*  MCV 93.5 92.1 92.1 94.1 92.2  PLT 196 234 230 238 256   Cardiac Enzymes: No results for input(s): CKTOTAL, CKMB, CKMBINDEX, TROPONINI in the last 168 hours. BNP (last 3 results)  Recent Labs  10/18/14 0727  PROBNP 3262.0*   CBG:  Recent Labs Lab 10/26/14 0745 10/26/14 1223 10/26/14 1706 10/26/14 2221 10/27/14 0800  GLUCAP 191* 226* 182* 216* 168*    Recent Results (from the past 240 hour(s))  Urine culture     Status: None   Collection Time: 10/17/14  2:54 PM  Result Value Ref Range Status   Specimen Description URINE, CLEAN CATCH  Final   Special Requests NONE  Final   Culture  Setup Time   Final    10/17/2014 21:07 Performed at [facility] Performed at [facility]  Final   Culture NO GROWTH Performed at [facility]  Final   Report Status 10/18/2014 FINAL  Final     Studies: Dg Chest 2 View  10/26/2014   CLINICAL DATA:  Shortness of breath.  EXAM: CHEST  2 VIEW  COMPARISON:  October 23, 2014.  FINDINGS: Stable cardiomediastinal silhouette. Stable bilateral pleural effusions are noted with left greater than right. No pneumothorax is noted. Bony thorax appears intact.  IMPRESSION: Stable bilateral pleural effusions with  probable underlying subsegmental atelectasis.   Electronically Signed   By: Sabino Dick M.D.   On: 10/26/2014 08:43    Scheduled Meds: . ALPRAZolam  0.25 mg Oral QHS  . aspirin EC  81 mg Oral Daily  . atenolol  50 mg Oral Daily  . darbepoetin (ARANESP) injection - NON-DIALYSIS  100 mcg Subcutaneous Q Fri-1800  . dorzolamide-timolol  1 drop Left Eye BID  . enoxaparin (LOVENOX) injection  30 mg Subcutaneous Q24H  . furosemide  60 mg Intravenous BID  . hydrALAZINE  50 mg Oral 3 times per day  . insulin aspart  0-9 Units Subcutaneous TID WC  . insulin aspart  4 Units Subcutaneous TID WC  . insulin glargine  20 Units Subcutaneous QHS  . nystatin cream   Topical BID  . pantoprazole  40 mg Oral Daily  . potassium chloride  20 mEq Oral Daily  . senna-docusate  1 tablet Oral QHS  . simvastatin  20 mg Oral QPM   Continuous Infusions:   Principal Problem:   Acute on chronic diastolic CHF (congestive heart failure) Active Problems:   AKI (acute kidney injury)   HTN (hypertension)   CKD (chronic kidney disease), stage III   Type I diabetes mellitus with complication, uncontrolled   Dyslipidemia   Anemia   Morbid obesity   Edema   Acute encephalopathy   Acute esophagitis   Gastritis and gastroduodenitis   Heme positive stool   Absolute anemia    Time spent: >35 minutes     Kinnie Feil  [facility] Pager [phone]. If 7PM-7AM, please contact night-coverage at [email or web address], password [facility] 10/27/2014, 11:51 AM  LOS: 10 days

## 2014-10-27 NOTE — Progress Notes (Signed)
Inpatient Diabetes Program Recommendations  AACE/ADA: New Consensus Statement on Inpatient Glycemic Control (2013)  Target Ranges:  Prepandial:   less than 140 mg/dL      Peak postprandial:   less than 180 mg/dL (1-2 hours)      Critically ill patients:  140 - 180 mg/dL    Results for [name] James, [name] James (MRN [id number]) as of 10/27/2014 08:04  Ref. Range 10/26/2014 07:45 10/26/2014 12:23 10/26/2014 17:06 10/26/2014 22:21  Glucose-Capillary Latest Range: 70-99 mg/dL 191 (H) 226 (H) 182 (H) 216 (H)      Fasting and Postprandial glucose levels still elevated.   MD- Please consider the following in-hospital insulin adjustments:  1. Increase Lantus by 20% to Lantus 24 units QHS 2. Increase Novolog Meal Coverage to 5 units tid with meals   Will follow Wyn Quaker RN, MSN, CDE Diabetes Coordinator Inpatient Diabetes Program Team Pager: [phone] (8a-10p)

## 2014-10-27 NOTE — Progress Notes (Signed)
ANTIBIOTIC CONSULT NOTE - INITIAL  Pharmacy Consult for Levaquin Indication: UTI  Allergies  Allergen Reactions  . Erythromycin Swelling  . Penicillins     She has taken keflex many times without porblems  . Codeine Rash    Patient Measurements: Height: 5\' 3"  (160 cm) Weight: 259 lb 12.8 oz (117.845 kg) IBW/kg (Calculated) : 52.4  Vital Signs: Temp: 97.5 F (36.4 C) (11/06 0822) Temp Source: Oral (11/06 0822) BP: 148/50 mmHg (11/06 1119) Pulse Rate: 65 (11/06 1119) Intake/Output from previous day: 11/05 0701 - 11/06 0700 In: 120 [P.O.:120] Out: 1500 [Urine:1500] Intake/Output from this shift: Total I/O In: 200 [P.O.:200] Out: 225 [Urine:225]  Labs:  Recent Labs  10/25/14 0500 10/26/14 0544 10/26/14 1525 10/26/14 2043 10/27/14 0435  WBC 9.8 8.8  --   --  9.7  HGB 7.9* 7.6*  --   --  8.0*  PLT 230 238  --   --  256  LABCREA  --   --   --  <10  --   CREATININE 4.74* 4.64* 4.53*  --  4.48*   Estimated Creatinine Clearance: 13.3 mL/min (by C-G formula based on Cr of 4.48).  Assessment:   To begin Levaquin IV for UTI.  CKD, crcl ~10-15 ml/min    Urine culture to be sent.  Spoke briefly with Dr. Daleen Bo. Dosing adjustment is not necessary for 3-days of treatment; would need adjustment if course prolonged.  Goal of Therapy:  appropriate Levaquin dose for renal function and infection  Plan:   Levaquin 250 mg IV q24hrs x 3 days.  Follow up urine culture.  Follow up for any need to change antibiotic or prolong course.  Dosing for > 3 days of treatment is 250 mg q48hrs.  Arty Baumgartner, [place] Pager: [phone] 10/27/2014,12:36 PM

## 2014-10-28 LAB — RENAL FUNCTION PANEL
Albumin: 2.1 g/dL — ABNORMAL LOW (ref 3.5–5.2)
Anion gap: 15 (ref 5–15)
BUN: 101 mg/dL — ABNORMAL HIGH (ref 6–23)
CALCIUM: 8.1 mg/dL — AB (ref 8.4–10.5)
CO2: 20 meq/L (ref 19–32)
CREATININE: 4.24 mg/dL — AB (ref 0.50–1.10)
Chloride: 107 mEq/L (ref 96–112)
GFR calc non Af Amer: 9 mL/min — ABNORMAL LOW (ref >90)
GFR, EST AFRICAN AMERICAN: 11 mL/min — AB (ref >90)
Glucose, Bld: 172 mg/dL — ABNORMAL HIGH (ref 70–99)
Phosphorus: 7.8 mg/dL — ABNORMAL HIGH (ref 2.3–4.6)
Potassium: 4.7 mEq/L (ref 3.7–5.3)
Sodium: 142 mEq/L (ref 137–147)

## 2014-10-28 LAB — GLUCOSE, CAPILLARY
GLUCOSE-CAPILLARY: 260 mg/dL — AB (ref 70–99)
Glucose-Capillary: 179 mg/dL — ABNORMAL HIGH (ref 70–99)
Glucose-Capillary: 198 mg/dL — ABNORMAL HIGH (ref 70–99)
Glucose-Capillary: 174 mg/dL — ABNORMAL HIGH (ref 70–99)

## 2014-10-28 LAB — CBC
HCT: 23.3 % — ABNORMAL LOW (ref 36.0–46.0)
Hemoglobin: 7.4 g/dL — ABNORMAL LOW (ref 12.0–15.0)
MCH: 29.6 pg (ref 26.0–34.0)
MCHC: 31.8 g/dL (ref 30.0–36.0)
MCV: 93.2 fL (ref 78.0–100.0)
Platelets: 239 10*3/uL (ref 150–400)
RBC: 2.5 MIL/uL — AB (ref 3.87–5.11)
RDW: 18.7 % — ABNORMAL HIGH (ref 11.5–15.5)
WBC: 7.8 10*3/uL (ref 4.0–10.5)

## 2014-10-28 MED ORDER — HYDRALAZINE HCL 50 MG PO TABS
75.0000 mg | ORAL_TABLET | Freq: Three times a day (TID) | ORAL | Status: DC
  Administered 2014-10-28 – 2014-11-02 (×13): 75 mg via ORAL
  Filled 2014-10-28 (×19): qty 1

## 2014-10-28 MED ORDER — CALCIUM ACETATE 667 MG PO CAPS
667.0000 mg | ORAL_CAPSULE | Freq: Three times a day (TID) | ORAL | Status: DC
  Administered 2014-10-28 – 2014-11-02 (×15): 667 mg via ORAL
  Filled 2014-10-28 (×19): qty 1

## 2014-10-28 MED ORDER — DEXTROSE 5 % IV SOLN
120.0000 mg | Freq: Two times a day (BID) | INTRAVENOUS | Status: DC
  Administered 2014-10-28 – 2014-10-29 (×2): 120 mg via INTRAVENOUS
  Filled 2014-10-28 (×3): qty 12

## 2014-10-28 MED ORDER — LEVOFLOXACIN 250 MG PO TABS
250.0000 mg | ORAL_TABLET | Freq: Every day | ORAL | Status: AC
  Administered 2014-10-28 – 2014-10-29 (×2): 250 mg via ORAL
  Filled 2014-10-28 (×2): qty 1

## 2014-10-28 NOTE — Progress Notes (Signed)
[facility] PROGRESS NOTE  [name] James [id number] DOB: 04-23-1936 DOA: 10/17/2014 PCP: Shirline Frees, MD  Assessment/Plan: 78 y/o female with PMH of HTN, CKD, CHF, DM presented with SOB, DOE, edema  1. Acute on chronic diastolic CHF likely worse with CKD/fluid overlaod:  -CXR: BL effusions edema; diuresed >8 L but still edematous, probable uremia developing  -11/7: clinically better; per nephrology ongoing discussions with patient, family about HD; appreciate the input    2. AKI on CKD IV; renal function worsened, Pt is still edematous -cont lasix/increased; as above 3. HTN (hypertension): cont current regimen  4. Type I diabetes mellitus with complication, uncontrolled: unclear history  -cont insulin regimen; obtain HA1c-6.4 5. Anemia, AoCD/kidney disease with new UGIB;  -s/p EGD (11/3): Reflux esophagitis, gastritis, duodenitis; cont PPI;  -TF sed 1 unit; on aranesp, Iron; monitor Hg; TF prn; per GI: If respiratory and functional status improve, consider outpt colonoscopy  6. Probable UTI, start IV atx; obtain urine cultures -pend    Code Status: full Family Communication: d/w patient, no family at the bedside  (indicate person spoken with, relationship, and if by phone, the number) Disposition Plan: pend clinical improvement ? HD; SNF   Consultants:  Nephrology,. GI  Procedures:  EGD  Antibiotics:  none (indicate start date, and stop date if known)  HPI/Subjective: alert  Objective: Filed Vitals:   10/28/14 0918  BP: 147/59  Pulse: 70  Temp:   Resp:     Intake/Output Summary (Last 24 hours) at 10/28/14 1051 Last data filed at 10/28/14 0525  Gross per 24 hour  Intake    477 ml  Output   1100 ml  Net   -623 ml   Filed Weights   10/26/14 0341 10/27/14 0426 10/28/14 0344  Weight: 115.214 kg (254 lb) 117.845 kg (259 lb 12.8 oz) 116.529 kg (256 lb 14.4 oz)    Exam:   General:  alert  Cardiovascular: s1,s2 rrr  Respiratory: BL  crackles   Abdomen: soft, nt,nd   Musculoskeletal: LE edema   Data Reviewed: Basic Metabolic Panel:  Recent Labs Lab 10/25/14 0500 10/26/14 0544 10/26/14 1525 10/27/14 0435 10/28/14 0318  NA 143 144 139 144 142  K 4.7 4.8 4.7 4.6 4.7  CL 108 108 104 107 107  CO2 20 17* 22 21 20   GLUCOSE 169* 185* 218* 165* 172*  BUN 105* 106* 101* 105* 101*  CREATININE 4.74* 4.64* 4.53* 4.48* 4.24*  CALCIUM 8.4 8.2* 8.5 8.2* 8.1*  PHOS  --   --   --   --  7.8*   Liver Function Tests:  Recent Labs Lab 10/28/14 0318  ALBUMIN 2.1*   No results for input(s): LIPASE, AMYLASE in the last 168 hours. No results for input(s): AMMONIA in the last 168 hours. CBC:  Recent Labs Lab 10/24/14 0920 10/25/14 0500 10/26/14 0544 10/27/14 0435 10/28/14 0318  WBC 9.5 9.8 8.8 9.7 7.8  HGB 8.5* 7.9* 7.6* 8.0* 7.4*  HCT 26.7* 24.5* 24.0* 24.7* 23.3*  MCV 92.1 92.1 94.1 92.2 93.2  PLT 234 230 238 256 239   Cardiac Enzymes: No results for input(s): CKTOTAL, CKMB, CKMBINDEX, TROPONINI in the last 168 hours. BNP (last 3 results)  Recent Labs  10/18/14 0727  PROBNP 3262.0*   CBG:  Recent Labs Lab 10/27/14 0800 10/27/14 1201 10/27/14 1705 10/27/14 2018 10/28/14 0753  GLUCAP 168* 259* 213* 169* 179*    No results found for this or any previous visit (from the past 240 hour(s)).  Studies: No results found.  Scheduled Meds: . ALPRAZolam  0.25 mg Oral QHS  . aspirin EC  81 mg Oral Daily  . atenolol  50 mg Oral Daily  . darbepoetin (ARANESP) injection - NON-DIALYSIS  100 mcg Subcutaneous Q Fri-1800  . dorzolamide-timolol  1 drop Left Eye BID  . enoxaparin (LOVENOX) injection  30 mg Subcutaneous Q24H  . ferumoxytol  510 mg Intravenous Q3 days  . furosemide  80 mg Intravenous BID  . hydrALAZINE  50 mg Oral 3 times per day  . insulin aspart  0-9 Units Subcutaneous TID WC  . insulin aspart  4 Units Subcutaneous TID WC  . insulin glargine  20 Units Subcutaneous QHS  . levofloxacin   250 mg Oral Q1400  . nystatin cream   Topical BID  . pantoprazole  40 mg Oral Daily  . potassium chloride  20 mEq Oral Daily  . senna-docusate  1 tablet Oral QHS  . simvastatin  20 mg Oral QPM   Continuous Infusions:   Principal Problem:   Acute on chronic diastolic CHF (congestive heart failure) Active Problems:   AKI (acute kidney injury)   HTN (hypertension)   CKD (chronic kidney disease), stage III   Type I diabetes mellitus with complication, uncontrolled   Dyslipidemia   Anemia   Morbid obesity   Edema   Acute encephalopathy   Acute esophagitis   Gastritis and gastroduodenitis   Heme positive stool   Absolute anemia    Time spent: >35 minutes     Kinnie Feil  [facility] Pager [phone]. If 7PM-7AM, please contact night-coverage at [email or web address], password [facility] 10/28/2014, 10:51 AM  LOS: 11 days

## 2014-10-28 NOTE — Progress Notes (Signed)
Subjective:  States she is feeling better.  Has not been up much to walk around.  Daughter states that the patient has been breathing better.  They are still deciding whether or not dialysis is in her future.  They don't expect to know before next week  Objective Vital signs in last 24 hours: Filed Vitals:   10/28/14 0021 10/28/14 0344 10/28/14 0520 10/28/14 0522  BP: 134/49  137/38 151/44  Pulse: 65  62   Temp: 97.9 F (36.6 C)  98 F (36.7 C)   TempSrc: Oral  Oral   Resp: 18  18   Height:      Weight:  256 lb 14.4 oz (116.529 kg)    SpO2: 100%  99%    Weight change: -2 lb 14.4 oz (-1.315 kg)  Intake/Output Summary (Last 24 hours) at 10/28/14 0733 Last data filed at 10/28/14 0525  Gross per 24 hour  Intake    677 ml  Output   1325 ml  Net   -648 ml   Weight Trending 10/17/14 113.3 kg 10/20/14 110.4 kg (nadir weight this admit) 10/27/14 117.8 kg  10/28/14 116.5 kg   Physical Exam:  BP 151/44 mmHg  Pulse 62  Temp(Src) 98 F (36.7 C) (Oral)  Resp 18  Ht 5\' 3"  (1.6 m)  Wt 256 lb 14.4 oz (116.529 kg)  BMI 45.52 kg/m2  SpO2 99%  Gen: Obese, laying in bed, in no distress. Daughter at bedside. Foley in place, with yellow urine in bag CV: RRR no m/r/g Resp: Anterior auscultation as patient was not able to sit up or roll to the side due to comfort. No crackles heard. Clear breath sounds. Ab: SNTND- obese Extremities: 2+ Pitting edema of lower extremities  Labs: Basic Metabolic Panel:  Recent Labs Lab 10/23/14 0735 10/24/14 0920 10/25/14 0500 10/26/14 0544 10/26/14 1525 10/27/14 0435 10/28/14 0318  NA 143 139 143 144 139 144 142  K 4.7 4.5 4.7 4.8 4.7 4.6 4.7  CL 108 104 108 108 104 107 107  CO2 19 19 20  17* 22 21 20   GLUCOSE 223* 186* 169* 185* 218* 165* 172*  BUN 105* 103* 105* 106* 101* 105* 101*  CREATININE 4.71* 4.75* 4.74* 4.64* 4.53* 4.48* 4.24*  CALCIUM 8.0* 8.5 8.4 8.2* 8.5 8.2* 8.1*  PHOS  --   --   --   --   --   --  7.8*    Recent Labs Lab  10/25/14 0500 10/26/14 0544 10/27/14 0435 10/28/14 0318  WBC 9.8 8.8 9.7 7.8  HGB 7.9* 7.6* 8.0* 7.4*  HCT 24.5* 24.0* 24.7* 23.3*  MCV 92.1 94.1 92.2 93.2  PLT 230 238 256 239    Recent Labs Lab 10/26/14 2221 10/27/14 0800 10/27/14 1201 10/27/14 1705 10/27/14 2018  GLUCAP 216* 168* 259* 213* 169*   Lab Results  Component Value Date   IRON 25* 10/27/2014   TIBC 328 10/27/2014   FERRITIN 29 10/27/2014   Studies/Results: Dg Chest 2 View  10/26/2014   CLINICAL DATA:  Shortness of breath.  EXAM: CHEST  2 VIEW  COMPARISON:  October 23, 2014.  FINDINGS: Stable cardiomediastinal silhouette. Stable bilateral pleural effusions are noted with left greater than right. No pneumothorax is noted. Bony thorax appears intact.  IMPRESSION: Stable bilateral pleural effusions with probable underlying subsegmental atelectasis.   Electronically Signed   By: Sabino Dick M.D.   On: 10/26/2014 08:43   Medications:   . ALPRAZolam  0.25 mg Oral QHS  . aspirin  EC  81 mg Oral Daily  . atenolol  50 mg Oral Daily  . darbepoetin (ARANESP) injection - NON-DIALYSIS  100 mcg Subcutaneous Q Fri-1800  . dorzolamide-timolol  1 drop Left Eye BID  . enoxaparin (LOVENOX) injection  30 mg Subcutaneous Q24H  . ferumoxytol  510 mg Intravenous Q3 days  . furosemide  80 mg Intravenous BID  . hydrALAZINE  50 mg Oral 3 times per day  . insulin aspart  0-9 Units Subcutaneous TID WC  . insulin aspart  4 Units Subcutaneous TID WC  . insulin glargine  20 Units Subcutaneous QHS  . levofloxacin (LEVAQUIN) IV  250 mg Intravenous Q24H  . nystatin cream   Topical BID  . pantoprazole  40 mg Oral Daily  . potassium chloride  20 mEq Oral Daily  . senna-docusate  1 tablet Oral QHS  . simvastatin  20 mg Oral QPM    I  have reviewed scheduled and prn medications.  Background 78 y.o F w/ PMH of CKD stage 3 (2.3 08/2014) , HFpEF (EF 60% w/ stage 1 diastolic dysfunction) on 123456, DM, HTN, and anemia who presented on  10/17/14 with exertional dyspnea, worsening lower extremity swelling, weakness 2 weeks and black stools (Hgb 9.8); FOBT +.- She appears volume overload with what appears to be a GI bleed but has been told is too unstable for invasive management.   Found to worsening CKD w/ Cr 4.03 (BUN/Cr 17) Last known Cr ~ 2.3 (09/20/14). Her renal ultrasound only consistent with medical renal disease. She is azotemic but does not appear to be clinically uremic at this time.   Peaked at around 4.75.  This is not likely to come to a quick resolution. Discussions about dialysis have been started. No urgent indications at this time  Assessment/Plan: 1. CKD: Cr 4.03 on admit with last know Cr ~ 2.3 on 08/2014. AKI on CKD. . Cr has fluctuated since admit with diuresis and IVF but no significant changes. Weight is actually up 7 kg from her nadir weight this admit and still appears volume overloaded to me.  No acidosis or electrolyte abnormality requiring urgent dialysis. Have discussed dialysis with patient and will discuss with her Nephrologist (Dr Florene Glen) and her daughter. UOP 1.3L in last 24hrs;decreased of Cr 4.48 (11/6) to 4.24 (11/7) w/ Bicarb and K wnl. Non-interpretable FENa because urine creatinine resulted as <10, which is most likely not accurate. Patient not oliguric. 1. Repeat UA: + Pro (100), Nit, Leu, Hgb (small) Many Bact & Grandular cast 2. Continue to diureses but will bump lasix to 120 IV BID 2. Hyperphosphatemia. Phosphorus 7.8 today.  Patient could benefit from binder. Start phoslo 667 tidac 3. Anemia: Hgb 9.8 on admit with Hx of melena and +FOBT on admit. Ferritin 41 with iron, TIBC, B12 and Folate wnl. EGD on 11/3 showed possible short segment Barrett's esophagus, esophagitis and gastritis. 2u pRBC given on 11/1 -> Hgb 8.4 trending down. Down to 7.4 (11/7). GI consulted per primary note 1. Iron studies with tsat of 8% - dose Feraheme 510 IV X 2 doses (S/p one dose on 11/6). Next dose on 11/9. On Aranesp  100 QFriday 4. HTN: Stable. Primary managing on Atenolol 50mg  qd, Lasix,  Hydralazine TID 5. DM: SSI  6. HLD: Zocor  Cordelia Poche, MD 10/28/2014, 7:33 AM PGY-2, [place]  I have seen and examined this patient and agree with plan and assessment in above note by Dr. Lonny Prude with highlighted additions.  Creatinine with  slow trend downward (not much change in GFR) and still wet.   Bump lasix again to 120 BID. Hb drifting down again - may need additional PRBC's. Getting IV fe and Aranesp.  Adding binders for elev phos.   Charlsie Fleeger B,MD 10/28/2014 1:51 PM

## 2014-10-28 NOTE — Clinical Social Work Note (Signed)
CSW continues to follow for d/c planning needs. CSW contacted patient's daughter Kenney Houseman) and left message for follow-up. CSW made visit to patient's room, no one present at bedside. CSW to follow tomorrow.  [place], [place] Weekend Clinical Social Worker [phone]

## 2014-10-29 ENCOUNTER — Encounter: Payer: Self-pay | Admitting: Internal Medicine

## 2014-10-29 DIAGNOSIS — R5381 Other malaise: Secondary | ICD-10-CM | POA: Diagnosis present

## 2014-10-29 DIAGNOSIS — D509 Iron deficiency anemia, unspecified: Secondary | ICD-10-CM

## 2014-10-29 LAB — RENAL FUNCTION PANEL
ALBUMIN: 2.5 g/dL — AB (ref 3.5–5.2)
Anion gap: 18 — ABNORMAL HIGH (ref 5–15)
BUN: 100 mg/dL — ABNORMAL HIGH (ref 6–23)
CHLORIDE: 106 meq/L (ref 96–112)
CO2: 18 mEq/L — ABNORMAL LOW (ref 19–32)
Calcium: 8.6 mg/dL (ref 8.4–10.5)
Creatinine, Ser: 3.71 mg/dL — ABNORMAL HIGH (ref 0.50–1.10)
GFR calc Af Amer: 13 mL/min — ABNORMAL LOW (ref >90)
GFR, EST NON AFRICAN AMERICAN: 11 mL/min — AB (ref >90)
Glucose, Bld: 172 mg/dL — ABNORMAL HIGH (ref 70–99)
POTASSIUM: 5.1 meq/L (ref 3.7–5.3)
Phosphorus: 7 mg/dL — ABNORMAL HIGH (ref 2.3–4.6)
Sodium: 142 mEq/L (ref 137–147)

## 2014-10-29 LAB — CBC
HCT: 29.6 % — ABNORMAL LOW (ref 36.0–46.0)
Hemoglobin: 9.5 g/dL — ABNORMAL LOW (ref 12.0–15.0)
MCH: 30.4 pg (ref 26.0–34.0)
MCHC: 32.1 g/dL (ref 30.0–36.0)
MCV: 94.9 fL (ref 78.0–100.0)
PLATELETS: 245 10*3/uL (ref 150–400)
RBC: 3.12 MIL/uL — ABNORMAL LOW (ref 3.87–5.11)
RDW: 18.8 % — AB (ref 11.5–15.5)
WBC: 8.3 10*3/uL (ref 4.0–10.5)

## 2014-10-29 LAB — GLUCOSE, CAPILLARY
Glucose-Capillary: 163 mg/dL — ABNORMAL HIGH (ref 70–99)
Glucose-Capillary: 224 mg/dL — ABNORMAL HIGH (ref 70–99)
Glucose-Capillary: 227 mg/dL — ABNORMAL HIGH (ref 70–99)

## 2014-10-29 MED ORDER — DEXTROSE 5 % IV SOLN
160.0000 mg | Freq: Three times a day (TID) | INTRAVENOUS | Status: DC
  Administered 2014-10-29 – 2014-11-01 (×9): 160 mg via INTRAVENOUS
  Filled 2014-10-29 (×11): qty 16

## 2014-10-29 NOTE — Progress Notes (Signed)
Chart reviewed.  [facility] PROGRESS NOTE  CHRIST ERLICH [id number] DOB: November 09, 1936 DOA: 10/17/2014 PCP: Shirline Frees, MD  Assessment/Plan: 78 y/o female with PMH of HTN, CKD, CHF, DM presented with SOB, DOE, edema  1. Acute on chronic diastolic CHF:  -CXR: BL effusions edema; diuresed >8 L but still edematous Still with significant dyspnea. Lasix increased. May need dialysis.  2. AKI on CKD IV; creatinine actually better today  3. HTN (hypertension): stable  4. Type 2 diabetes mellitus with renal complications: not optimally controlled. Will increase insulin  5. Anemia, AoCD/kidney disease with new UGIB and iron deficiency;  -s/p EGD (11/3): Reflux esophagitis, gastritis, duodenitis; cont PPI;  -TF sed 1 unit; on aranesp, Iron; monitor Hg; TF prn; per GI: If respiratory and functional status improve, consider outpt colonoscopy. H/h stable  6. Probable UTI, start IV atx; obtain urine cultures -pend   Code Status: full Family Communication: grandson Disposition Plan: pend clinical improvement ? HD; SNF   Consultants:  Nephrology,. GI  Procedures:  EGD  Antibiotics:  ceftriaxone  HPI/Subjective: C/o dyspnea, weakness, poor appetite, poor sleep  Objective: Filed Vitals:   10/29/14 0540  BP: 183/58  Pulse: 66  Temp: 97.4 F (36.3 C)  Resp: 18    Intake/Output Summary (Last 24 hours) at 10/29/14 1520 Last data filed at 10/29/14 0543  Gross per 24 hour  Intake    960 ml  Output   1300 ml  Net   -340 ml   Filed Weights   10/27/14 0426 10/28/14 0344 10/29/14 0540  Weight: 117.845 kg (259 lb 12.8 oz) 116.529 kg (256 lb 14.4 oz) 109.408 kg (241 lb 3.2 oz)    Exam:   General:  Weak appearing. Morbidly obese. Speaking in full sentence  Cardiovascular: s1,s2 rrr  Respiratory: CTA without WRR  Abdomen: soft, nt, obese  Musculoskeletal: LE edema   Data Reviewed: Basic Metabolic Panel:  Recent Labs Lab 10/26/14 0544 10/26/14 1525  10/27/14 0435 10/28/14 0318 10/29/14 0614  NA 144 139 144 142 142  K 4.8 4.7 4.6 4.7 5.1  CL 108 104 107 107 106  CO2 17* 22 21 20  18*  GLUCOSE 185* 218* 165* 172* 172*  BUN 106* 101* 105* 101* 100*  CREATININE 4.64* 4.53* 4.48* 4.24* 3.71*  CALCIUM 8.2* 8.5 8.2* 8.1* 8.6  PHOS  --   --   --  7.8* 7.0*   Liver Function Tests:  Recent Labs Lab 10/28/14 0318 10/29/14 0614  ALBUMIN 2.1* 2.5*   No results for input(s): LIPASE, AMYLASE in the last 168 hours. No results for input(s): AMMONIA in the last 168 hours. CBC:  Recent Labs Lab 10/25/14 0500 10/26/14 0544 10/27/14 0435 10/28/14 0318 10/29/14 0614  WBC 9.8 8.8 9.7 7.8 8.3  HGB 7.9* 7.6* 8.0* 7.4* 9.5*  HCT 24.5* 24.0* 24.7* 23.3* 29.6*  MCV 92.1 94.1 92.2 93.2 94.9  PLT 230 238 256 239 245   Cardiac Enzymes: No results for input(s): CKTOTAL, CKMB, CKMBINDEX, TROPONINI in the last 168 hours. BNP (last 3 results)  Recent Labs  10/18/14 0727  PROBNP 3262.0*   CBG:  Recent Labs Lab 10/28/14 1157 10/28/14 1634 10/28/14 2133 10/29/14 0806 10/29/14 1203  GLUCAP 260* 198* 174* 163* 224*    No results found for this or any previous visit (from the past 240 hour(s)).   Studies: No results found.  Scheduled Meds: . ALPRAZolam  0.25 mg Oral QHS  . aspirin EC  81 mg Oral Daily  . atenolol  50 mg Oral Daily  . calcium acetate  667 mg Oral TID WC  . darbepoetin (ARANESP) injection - NON-DIALYSIS  100 mcg Subcutaneous Q Fri-1800  . dorzolamide-timolol  1 drop Left Eye BID  . enoxaparin (LOVENOX) injection  30 mg Subcutaneous Q24H  . ferumoxytol  510 mg Intravenous Q3 days  . furosemide  160 mg Intravenous 3 times per day  . hydrALAZINE  75 mg Oral 3 times per day  . insulin aspart  0-9 Units Subcutaneous TID WC  . insulin aspart  4 Units Subcutaneous TID WC  . insulin glargine  20 Units Subcutaneous QHS  . nystatin cream   Topical BID  . pantoprazole  40 mg Oral Daily  . potassium chloride  20 mEq  Oral Daily  . senna-docusate  1 tablet Oral QHS  . simvastatin  20 mg Oral QPM   Continuous Infusions:   Time spent: >35 minutes   [place] Pager [phone]. If 7PM-7AM, please contact night-coverage at [email or web address], password [facility] 10/29/2014, 3:20 PM  LOS: 12 days

## 2014-10-29 NOTE — Progress Notes (Signed)
Subjective:  States she is feeling okay today.  Energy is better today.  States her daughter and son-in-law came last night.  Her daughter will be coming by later in the day. Tells me she has been more short of breath today  Objective Vital signs in last 24 hours: Filed Vitals:   10/28/14 1416 10/28/14 1555 10/28/14 2038 10/29/14 0540  BP: 131/81 135/84 171/54 183/58  Pulse: 65  68 66  Temp: 97.5 F (36.4 C)  97.4 F (36.3 C) 97.4 F (36.3 C)  TempSrc: Oral  Oral Oral  Resp: 18  20 18   Height:      Weight:    241 lb 3.2 oz (109.408 kg)  SpO2: 99%  100% 99%   Weight change: -15 lb 11.2 oz (-7.121 kg)  Intake/Output Summary (Last 24 hours) at 10/29/14 0647 Last data filed at 10/29/14 0543  Gross per 24 hour  Intake   1200 ml  Output   1300 ml  Net   -100 ml   Weight Trending 10/17/14 113.3 kg 10/20/14 110.4 kg (nadir weight this admit) 10/27/14 117.8 kg  10/28/14 116.5 kg 10/29/14  109.4   Physical Exam:  BP 183/58 mmHg  Pulse 66  Temp(Src) 97.4 F (36.3 C) (Oral)  Resp 18  Ht 5\' 3"  (1.6 m)  Wt 241 lb 3.2 oz (109.408 kg)  BMI 42.74 kg/m2  SpO2 99%  Gen: Obese, laying in bed, in no distress.  Foley in place clear urine CV: RRR no m/r/g Resp: Diffusely decreased breath sounds, likely due to habitus. Mild crackles at bases bilaterally Crackles more prominent than yesterday with some mild JVD Ab: SNTND- obese Extremities: 1+ to 2+ pitting edema of lower extremities  Labs: Basic Metabolic Panel:  Recent Labs Lab 10/24/14 0920 10/25/14 0500 10/26/14 0544 10/26/14 1525 10/27/14 0435 10/28/14 0318 10/29/14 0614  NA 139 143 144 139 144 142 142  K 4.5 4.7 4.8 4.7 4.6 4.7 5.1  CL 104 108 108 104 107 107 106  CO2 19 20 17* 22 21 20  18*  GLUCOSE 186* 169* 185* 218* 165* 172* 172*  BUN 103* 105* 106* 101* 105* 101* 100*  CREATININE 4.75* 4.74* 4.64* 4.53* 4.48* 4.24* 3.71*  CALCIUM 8.5 8.4 8.2* 8.5 8.2* 8.1* 8.6  PHOS  --   --   --   --   --  7.8* 7.0*     Recent Labs Lab 10/26/14 0544 10/27/14 0435 10/28/14 0318 10/29/14 0614  WBC 8.8 9.7 7.8 8.3  HGB 7.6* 8.0* 7.4* 9.5*  HCT 24.0* 24.7* 23.3* 29.6*  MCV 94.1 92.2 93.2 94.9  PLT 238 256 239 245    Recent Labs Lab 10/27/14 2018 10/28/14 0753 10/28/14 1157 10/28/14 1634 10/28/14 2133  GLUCAP 169* 179* 260* 198* 174*   Lab Results  Component Value Date   IRON 25* 10/27/2014   TIBC 328 10/27/2014   FERRITIN 29 10/27/2014   Medications:   . ALPRAZolam  0.25 mg Oral QHS  . aspirin EC  81 mg Oral Daily  . atenolol  50 mg Oral Daily  . calcium acetate  667 mg Oral TID WC  . darbepoetin (ARANESP) injection - NON-DIALYSIS  100 mcg Subcutaneous Q Fri-1800  . dorzolamide-timolol  1 drop Left Eye BID  . enoxaparin (LOVENOX) injection  30 mg Subcutaneous Q24H  . ferumoxytol  510 mg Intravenous Q3 days  . furosemide  120 mg Intravenous BID  . hydrALAZINE  75 mg Oral 3 times per day  . insulin  aspart  0-9 Units Subcutaneous TID WC  . insulin aspart  4 Units Subcutaneous TID WC  . insulin glargine  20 Units Subcutaneous QHS  . levofloxacin  250 mg Oral Q1400  . nystatin cream   Topical BID  . pantoprazole  40 mg Oral Daily  . potassium chloride  20 mEq Oral Daily  . senna-docusate  1 tablet Oral QHS  . simvastatin  20 mg Oral QPM    Background 78 y.o F w/ PMH of CKD stage 3 (2.3 08/2014 - patient of Dr. Florene Glen) , diastolic heart failure (EF 60% w/ stage 1 diastolic dysfunction), DM, HTN, and anemia who presented on 10/17/14 with exertional dyspnea, worsening lower extremity swelling, weakness 2 weeks and black stools (Hgb 9.8); FOBT +.- Volume overload with what appears to be a GI bleed but has been told is too unstable for invasive management.   Found to have worsening CKD on adm w/ Cr 4.03 (BUN/Cr 17) Last known Cr ~ 2.3 (09/20/14). Her renal ultrasound only consistent with medical renal disease. Creatinine rose to high of 4.75.  She is azotemic w/ BUN > 100 but does not  appear to be clinically uremic at this time. This is not likely to come to a quick resolution. Discussions about dialysis have been started. No urgent indications at this time  Assessment/Plan: 1. CKD: Cr 4.03 on admit with last know Cr ~ 2.3 on 08/2014. AKI on CKD. . Cr has fluctuated since admit with alternating "diuresis" (never actually lost any weight) and IVF but no significant changes. At time of our consultation we assessed her as wet and have been increasing diuretics with some success. Weight today recorded with a 7kg loss. Unsure of accuracy. No acidosis or electrolyte abnormality requiring urgent dialysis. Have discussed dialysis with patient and will discuss with her Nephrologist (Dr Florene Glen) and her daughter. UOP 1.3L in last 24hrs; Creatinine continuing to decrease; down to 3.71 (11/8). Non-interpretable FENa because urine creatinine resulted as <10, which is most likely not accurate. Patient not oliguric. 1. Repeat UA: + Pro (100), Nit, Leu, Hgb (small) Many Bact & Grandular cast 2. Continue lasix but given her worsening SOB today will increase dose to 160 IV TID from 120 BID 2. Hyperphosphatemia. Phosphorus 7.0 today. Continue phoslo 667 tidac. 3. Anemia: Hgb 9.8 on admit with Hx of melena and +FOBT on admit. Ferritin 41 with iron, TIBC, B12 and Folate wnl. EGD on 11/3 showed possible short segment Barrett's esophagus, esophagitis and gastritis. 2u pRBC given on 11/1 -> Hgb 8.4. Currently up to 9.5 today. Will continue to trend. GI signed off. Probably outpatient colonoscopy and will follow-up biopsies. 1. Iron studies with tsat of 8% - dose Feraheme 510 IV X 2 doses (S/p one dose on 11/6). Last dose on 11/9. On Aranesp 100 QFriday 4. HTN: Stable. Primary managing on Atenolol 50mg  qd, Lasix,  Hydralazine TID 5. DM: SSI  6. HLD: Zocor 7. Asymptomatic bacteruria: started on levaquin and urine cultures obtained by primary  Cordelia Poche, MD 10/29/2014, 6:47 AM PGY-2, [place]  I have seen and examined this patient and agree with plan and assessment in the note of Dr. Lonny Prude with highlighted modifications and additions. At the time I saw her today, more dyspneic and still volume overloaded so have bumped her lasix to 160 IV TID.  Her creatinine has actually been falling some as we have been diuresing her - .3.71 today.  She continues to talk with her daughter about  whether she would agree to dialysis if needed but at this time has not decided.  Marriana Hibberd B,MD 10/29/2014 1:08 PM

## 2014-10-30 DIAGNOSIS — E1129 Type 2 diabetes mellitus with other diabetic kidney complication: Secondary | ICD-10-CM

## 2014-10-30 DIAGNOSIS — R5381 Other malaise: Secondary | ICD-10-CM

## 2014-10-30 DIAGNOSIS — E1165 Type 2 diabetes mellitus with hyperglycemia: Secondary | ICD-10-CM

## 2014-10-30 LAB — RENAL FUNCTION PANEL
Albumin: 2.3 g/dL — ABNORMAL LOW (ref 3.5–5.2)
Anion gap: 17 — ABNORMAL HIGH (ref 5–15)
BUN: 97 mg/dL — AB (ref 6–23)
CALCIUM: 8.8 mg/dL (ref 8.4–10.5)
CO2: 20 meq/L (ref 19–32)
CREATININE: 3.55 mg/dL — AB (ref 0.50–1.10)
Chloride: 107 mEq/L (ref 96–112)
GFR calc Af Amer: 13 mL/min — ABNORMAL LOW (ref >90)
GFR calc non Af Amer: 12 mL/min — ABNORMAL LOW (ref >90)
GLUCOSE: 151 mg/dL — AB (ref 70–99)
Phosphorus: 6.5 mg/dL — ABNORMAL HIGH (ref 2.3–4.6)
Potassium: 4.6 mEq/L (ref 3.7–5.3)
Sodium: 144 mEq/L (ref 137–147)

## 2014-10-30 LAB — GLUCOSE, CAPILLARY
GLUCOSE-CAPILLARY: 145 mg/dL — AB (ref 70–99)
GLUCOSE-CAPILLARY: 205 mg/dL — AB (ref 70–99)
Glucose-Capillary: 155 mg/dL — ABNORMAL HIGH (ref 70–99)
Glucose-Capillary: 189 mg/dL — ABNORMAL HIGH (ref 70–99)
Glucose-Capillary: 193 mg/dL — ABNORMAL HIGH (ref 70–99)
Glucose-Capillary: 220 mg/dL — ABNORMAL HIGH (ref 70–99)

## 2014-10-30 LAB — CBC
HEMATOCRIT: 26.3 % — AB (ref 36.0–46.0)
HEMOGLOBIN: 8.3 g/dL — AB (ref 12.0–15.0)
MCH: 29.6 pg (ref 26.0–34.0)
MCHC: 31.6 g/dL (ref 30.0–36.0)
MCV: 93.9 fL (ref 78.0–100.0)
Platelets: 280 10*3/uL (ref 150–400)
RBC: 2.8 MIL/uL — ABNORMAL LOW (ref 3.87–5.11)
RDW: 18.3 % — ABNORMAL HIGH (ref 11.5–15.5)
WBC: 7.6 10*3/uL (ref 4.0–10.5)

## 2014-10-30 LAB — URINE CULTURE

## 2014-10-30 MED ORDER — INSULIN GLARGINE 100 UNIT/ML ~~LOC~~ SOLN
24.0000 [IU] | Freq: Every day | SUBCUTANEOUS | Status: DC
  Administered 2014-10-30 – 2014-11-01 (×3): 24 [IU] via SUBCUTANEOUS
  Filled 2014-10-30 (×6): qty 0.24

## 2014-10-30 MED ORDER — INSULIN ASPART 100 UNIT/ML ~~LOC~~ SOLN
5.0000 [IU] | Freq: Three times a day (TID) | SUBCUTANEOUS | Status: DC
  Administered 2014-10-31 – 2014-11-02 (×8): 5 [IU] via SUBCUTANEOUS

## 2014-10-30 MED ORDER — CEPHALEXIN 250 MG PO CAPS
250.0000 mg | ORAL_CAPSULE | Freq: Two times a day (BID) | ORAL | Status: DC
  Administered 2014-10-30 – 2014-11-02 (×6): 250 mg via ORAL
  Filled 2014-10-30 (×7): qty 1

## 2014-10-30 NOTE — Progress Notes (Signed)
Inpatient Diabetes Program Recommendations  AACE/ADA: New Consensus Statement on Inpatient Glycemic Control (2013)  Target Ranges:  Prepandial:   less than 140 mg/dL      Peak postprandial:   less than 180 mg/dL (1-2 hours)      Critically ill patients:  140 - 180 mg/dL   Cbg's tend to elevate following meals. Please consider increase to 5-6 units tidwc Inpatient Diabetes Program Recommendations Insulin - Meal Coverage: Please increase novolog meal coverage to 5-6 units tidwc.  Thank you, Rosita Kea, RN, CNS, Diabetes Coordinator [phone])

## 2014-10-30 NOTE — Progress Notes (Signed)
Physical Therapy Treatment Patient Details Name: [name] James MRN: [id number] DOB: 11-09-1936 Today's Date: 10/30/2014    History of Present Illness 78 yo female with onset CHF and EF 55%, DM and claudication issues to reduce LE edema.      PT Comments    Pt progressing slowly, was able to ambulate within room today on 2L O2 with sats 94%, used RW and min A. Pt still fatiguing very quickly. Encouraged increased mvmt throughout day, ie performing exercises in chair and bed, getting OOB to [facility] with nsg instead of bed pain, etc. PT will continue to follow.   Follow Up Recommendations  SNF;Supervision/Assistance - 24 hour     Equipment Recommendations  None recommended by PT    Recommendations for Other Services       Precautions / Restrictions Precautions Precautions: Fall Restrictions Weight Bearing Restrictions: No    Mobility  Bed Mobility Overal bed mobility: Needs Assistance Bed Mobility: Rolling;Supine to Sit Rolling: Supervision   Supine to sit: Min assist     General bed mobility comments: pt has tendency to slide legs off bed first and then get stuck in supine with insufficient abdominal and UE strength to pull self fwd. Cued pt to put LE's back into bed and roll first. She was able to do this with bedrail and increased time and then slide legs off bed with min  A and elevate trunk to achieve sitting with only supervision. Educated pt on using this method consistently  Transfers Overall transfer level: Needs assistance Equipment used: Rolling walker (2 wheeled) Transfers: Sit to/from Stand Sit to Stand: Min assist         General transfer comment: vc's for hand placement, min A for power up from bed, only min-guard from recliner.  Ambulation/Gait Ambulation/Gait assistance: Min assist Ambulation Distance (Feet): 20 Feet (5' fwd and back, 2x) Assistive device: Rolling walker (2 wheeled) Gait Pattern/deviations: Step-through pattern;Decreased stride  length;Trunk flexed Gait velocity: slow Gait velocity interpretation: <1.8 ft/sec, indicative of risk for recurrent falls General Gait Details: vc's for hip extension in standing. Pt could tolerate no more than 5' fwd today at a time due to [place]. Practice with backing up and having pt step and then move RW and not do both simultaneously to increase safety. 3 mins rest in between bouts.    Stairs            Wheelchair Mobility    Modified Rankin (Stroke Patients Only)       Balance Overall balance assessment: Needs assistance Sitting-balance support: Feet supported Sitting balance-Leahy Scale: Fair     Standing balance support: Bilateral upper extremity supported;During functional activity Standing balance-Leahy Scale: Poor Standing balance comment: weakness and fwd posture necessitates UE support                    Cognition Arousal/Alertness: Awake/alert Behavior During Therapy: WFL for tasks assessed/performed Overall Cognitive Status: Within Functional Limits for tasks assessed       Memory: Decreased short-term memory              Exercises General Exercises - Lower Extremity Ankle Circles/Pumps: AROM;10 reps;Both;Seated Gluteal Sets: AROM;10 reps;Seated Long Arc Quad: AROM;Both;10 reps;Seated Hip ABduction/ADduction: AROM;Both;10 reps;Seated Straight Leg Raises: AROM;Both;10 reps;Seated Hip Flexion/Marching: AROM;Both;10 reps;Seated    General Comments General comments (skin integrity, edema, etc.): discussed pursed lip breathing and relaxation      Pertinent Vitals/Pain Pain Assessment: No/denies pain    Home Living  Prior Function            PT Goals (current goals can now be found in the care plan section) Acute Rehab PT Goals Patient Stated Goal: get stronger PT Goal Formulation: With patient Time For Goal Achievement: 11/03/14 Potential to Achieve Goals: Fair Progress towards PT goals: Progressing  toward goals    Frequency  Min 3X/week    PT Plan Current plan remains appropriate    Co-evaluation             End of Session Equipment Utilized During Treatment: Gait belt;Oxygen Activity Tolerance: Patient limited by fatigue Patient left: in chair;with call bell/phone within reach     Time: 1201-1232 PT Time Calculation (min): 31 min  Charges:  $Gait Training: 8-22 mins $Therapeutic Exercise: 8-22 mins                    G Codes:     Leighton Roach, PT  Acute Rehab Services  [place], [place] 10/30/2014, 1:10 PM

## 2014-10-30 NOTE — Progress Notes (Signed)
[facility] PROGRESS NOTE  [name] James [id number] DOB: [birth year] DOA: 10/17/2014 PCP: Shirline Frees, MD  Assessment/Plan: 78 y/o female with PMH of HTN, CKD, CHF, DM presented with SOB, DOE, edema  1. Acute on chronic diastolic CHF:  -CXR: BL effusions edema; diuresed >1 L  Remains on high dose iv lasix. Dyspnea slightly better today  2. AKI on CKD IV; creatinine improving with diuresis, but still heading toward dialysis.  Patient now agreeable to proceed with dialysis.  3. HTN (hypertension): stable  4. Type 2 diabetes mellitus with renal complications: not optimally controlled. Adjusting insulin  5. Anemia, AoCD/kidney disease with new UGIB and iron deficiency;  -s/p EGD (11/3): Reflux esophagitis, gastritis, duodenitis; cont PPI;  -TF sed 1 unit; on aranesp, Iron;  per GI: If respiratory and functional status improve, consider outpt colonoscopy. H/h stable  6. UTI: pansensitive E coli. S/p 2 days levaquin. Narrow to keflex. Would treat for 7 days (or more), as she has foley in  Code Status: full Family Communication: grandson, daughter, son in law Disposition Plan: eventual SNF  Consultants:  Nephrology,. GI  Procedures:  EGD  Antibiotics:    HPI/Subjective: Dyspnea improved. Slept better.   Objective: Filed Vitals:   10/30/14 1447  BP: 142/60  Pulse: 64  Temp: 97.9 F (36.6 C)  Resp: 18    Intake/Output Summary (Last 24 hours) at 10/30/14 1659 Last data filed at 10/30/14 0952  Gross per 24 hour  Intake    372 ml  Output   3250 ml  Net  -2878 ml   Filed Weights   10/28/14 0344 10/29/14 0540 10/30/14 0500  Weight: 116.529 kg (256 lb 14.4 oz) 109.408 kg (241 lb 3.2 oz) 110.179 kg (242 lb 14.4 oz)    Exam:   General:  Weak appearing. Morbidly obese. Speaking in full sentence  Cardiovascular: s1,s2 rrr  Respiratory: CTA without WRR  Abdomen: soft, nt, obese  Musculoskeletal: 1+  edema   Data Reviewed: Basic Metabolic  Panel:  Recent Labs Lab 10/26/14 1525 10/27/14 0435 10/28/14 0318 10/29/14 0614 10/30/14 0515  NA 139 144 142 142 144  K 4.7 4.6 4.7 5.1 4.6  CL 104 107 107 106 107  CO2 22 21 20  18* 20  GLUCOSE 218* 165* 172* 172* 151*  BUN 101* 105* 101* 100* 97*  CREATININE 4.53* 4.48* 4.24* 3.71* 3.55*  CALCIUM 8.5 8.2* 8.1* 8.6 8.8  PHOS  --   --  7.8* 7.0* 6.5*   Liver Function Tests:  Recent Labs Lab 10/28/14 0318 10/29/14 0614 10/30/14 0515  ALBUMIN 2.1* 2.5* 2.3*   No results for input(s): LIPASE, AMYLASE in the last 168 hours. No results for input(s): AMMONIA in the last 168 hours. CBC:  Recent Labs Lab 10/26/14 0544 10/27/14 0435 10/28/14 0318 10/29/14 0614 10/30/14 0515  WBC 8.8 9.7 7.8 8.3 7.6  HGB 7.6* 8.0* 7.4* 9.5* 8.3*  HCT 24.0* 24.7* 23.3* 29.6* 26.3*  MCV 94.1 92.2 93.2 94.9 93.9  PLT 238 256 239 245 280   Cardiac Enzymes: No results for input(s): CKTOTAL, CKMB, CKMBINDEX, TROPONINI in the last 168 hours. BNP (last 3 results)  Recent Labs  10/18/14 0727  PROBNP 3262.0*   CBG:  Recent Labs Lab 10/29/14 1723 10/30/14 0004 10/30/14 0753 10/30/14 1222 10/30/14 1635  GLUCAP 227* 220* 145* 189* 205*    Recent Results (from the past 240 hour(s))  Culture, Urine     Status: None   Collection Time: 10/27/14 11:50 AM  Result Value  Ref Range Status   Specimen Description URINE, CATHETERIZED  Final   Special Requests NONE  Final   Culture  Setup Time   Final    10/27/2014 19:03 Performed at [place]   Final    >=100,000 COLONIES/ML Performed at [place] Performed at [facility]    Report Status 10/30/2014 FINAL  Final   Organism ID, Bacteria ESCHERICHIA COLI  Final      Susceptibility   Escherichia coli - MIC*    AMPICILLIN <=2 SENSITIVE Sensitive     CEFAZOLIN <=4 SENSITIVE Sensitive     CEFTRIAXONE <=1 SENSITIVE Sensitive     CIPROFLOXACIN <=0.25  SENSITIVE Sensitive     GENTAMICIN <=1 SENSITIVE Sensitive     LEVOFLOXACIN <=0.12 SENSITIVE Sensitive     NITROFURANTOIN <=16 SENSITIVE Sensitive     TOBRAMYCIN <=1 SENSITIVE Sensitive     TRIMETH/SULFA <=20 SENSITIVE Sensitive     PIP/TAZO <=4 SENSITIVE Sensitive     * ESCHERICHIA COLI     Studies: No results found.  Scheduled Meds: . ALPRAZolam  0.25 mg Oral QHS  . aspirin EC  81 mg Oral Daily  . atenolol  50 mg Oral Daily  . calcium acetate  667 mg Oral TID WC  . darbepoetin (ARANESP) injection - NON-DIALYSIS  100 mcg Subcutaneous Q Fri-1800  . dorzolamide-timolol  1 drop Left Eye BID  . enoxaparin (LOVENOX) injection  30 mg Subcutaneous Q24H  . furosemide  160 mg Intravenous 3 times per day  . hydrALAZINE  75 mg Oral 3 times per day  . insulin aspart  0-9 Units Subcutaneous TID WC  . insulin aspart  4 Units Subcutaneous TID WC  . insulin glargine  20 Units Subcutaneous QHS  . nystatin cream   Topical BID  . pantoprazole  40 mg Oral Daily  . potassium chloride  20 mEq Oral Daily  . senna-docusate  1 tablet Oral QHS  . simvastatin  20 mg Oral QPM   Continuous Infusions:   Time spent: >35 minutes   [place] Pager [phone]. If 7PM-7AM, please contact night-coverage at [email or web address], password [facility] 10/30/2014, 4:59 PM  LOS: 13 days

## 2014-10-30 NOTE — Progress Notes (Addendum)
Subjective:  States she is feeling okay today. Breathing about the same. Her daughter will be coming by later in the day. Still undecided about dialysis- does not look like urine is completely recorded- more shown today  Objective Vital signs in last 24 hours: Filed Vitals:   10/29/14 1433 10/29/14 2023 10/30/14 0500 10/30/14 0507  BP: 157/58 134/52  142/55  Pulse: 70 65  64  Temp: 97.5 F (36.4 C) 97.9 F (36.6 C)  97.5 F (36.4 C)  TempSrc: Oral Oral  Oral  Resp: 18 18  19   Height:      Weight:   242 lb 14.4 oz (110.179 kg)   SpO2: 98% 97%  64%   Weight change: 1 lb 11.2 oz (0.771 kg)  Intake/Output Summary (Last 24 hours) at 10/30/14 [id number] Last data filed at 10/29/14 1725  Gross per 24 hour  Intake     66 ml  Output   1000 ml  Net   -934 ml   Weight Trending 10/17/14 113.3 kg 10/20/14 110.4 kg (nadir weight this admit) 10/27/14 117.8 kg  10/28/14 116.5 kg 10/29/14  109.4 11/9        110.179 kg  Physical Exam:  BP 142/55 mmHg  Pulse 64  Temp(Src) 97.5 F (36.4 C) (Oral)  Resp 19  Ht 5\' 3"  (1.6 m)  Wt 242 lb 14.4 oz (110.179 kg)  BMI 43.04 kg/m2  SpO2 64%  Gen: Obese, laying in bed, in no distress.  Foley in place clear urine CV: RRR no m/r/g Resp: Diffusely decreased breath sounds, likely due to habitus. Mild crackles at bases bilaterally  Ab: SNTND- obese Extremities: 1+ to 2+ pitting edema of lower extremities  Labs: Basic Metabolic Panel:  Recent Labs Lab 10/25/14 0500 10/26/14 0544 10/26/14 1525 10/27/14 0435 10/28/14 0318 10/29/14 0614 10/30/14 0515  NA 143 144 139 144 142 142 144  K 4.7 4.8 4.7 4.6 4.7 5.1 4.6  CL 108 108 104 107 107 106 107  CO2 20 17* 22 21 20  18* 20  GLUCOSE 169* 185* 218* 165* 172* 172* 151*  BUN 105* 106* 101* 105* 101* 100* 97*  CREATININE 4.74* 4.64* 4.53* 4.48* 4.24* 3.71* 3.55*  CALCIUM 8.4 8.2* 8.5 8.2* 8.1* 8.6 8.8  PHOS  --   --   --   --  7.8* 7.0* 6.5*    Recent Labs Lab 10/27/14 0435 10/28/14 0318  10/29/14 0614 10/30/14 0515  WBC 9.7 7.8 8.3 7.6  HGB 8.0* 7.4* 9.5* 8.3*  HCT 24.7* 23.3* 29.6* 26.3*  MCV 92.2 93.2 94.9 93.9  PLT 256 239 245 280    Recent Labs Lab 10/28/14 2133 10/29/14 0806 10/29/14 1203 10/29/14 1723 10/30/14 0004  GLUCAP 174* 163* 224* 227* 220*   Lab Results  Component Value Date   IRON 25* 10/27/2014   TIBC 328 10/27/2014   FERRITIN 29 10/27/2014   Medications:   . ALPRAZolam  0.25 mg Oral QHS  . aspirin EC  81 mg Oral Daily  . atenolol  50 mg Oral Daily  . calcium acetate  667 mg Oral TID WC  . darbepoetin (ARANESP) injection - NON-DIALYSIS  100 mcg Subcutaneous Q Fri-1800  . dorzolamide-timolol  1 drop Left Eye BID  . enoxaparin (LOVENOX) injection  30 mg Subcutaneous Q24H  . ferumoxytol  510 mg Intravenous Q3 days  . furosemide  160 mg Intravenous 3 times per day  . hydrALAZINE  75 mg Oral 3 times per day  . insulin aspart  0-9 Units Subcutaneous TID WC  . insulin aspart  4 Units Subcutaneous TID WC  . insulin glargine  20 Units Subcutaneous QHS  . nystatin cream   Topical BID  . pantoprazole  40 mg Oral Daily  . potassium chloride  20 mEq Oral Daily  . senna-docusate  1 tablet Oral QHS  . simvastatin  20 mg Oral QPM    Background 78 y.o F w/ PMH of CKD stage 3 (2.3 08/2014 - patient of Dr. Florene Glen) , diastolic heart failure (EF 60% w/ stage 1 diastolic dysfunction), DM, HTN, and anemia who presented on 10/17/14 with exertional dyspnea, worsening lower extremity swelling, weakness 2 weeks and black stools (Hgb 9.8); FOBT +.- Volume overload with what appears to be a GI bleed but has been told is too unstable for invasive management.   Found to have worsening CKD on adm w/ Cr 4.03 (BUN/Cr 17) Last known Cr ~ 2.3 (09/20/14).  Creatinine rose to high of 4.75.  She is azotemic w/ BUN > 100 but does not appear to be clinically uremic at this time. This is not likely to come to a quick resolution. Discussions about dialysis have been started. No  urgent indications at this time  Assessment/Plan: 1. CKD: Cr 4.03 on admit with last know Cr ~ 2.3 on 08/2014. AKI on CKD. . Cr has fluctuated since admit with alternating "diuresis" (never actually lost any weight) and IVF but no significant changes. At time of our consultation we assessed her as wet and have been increasing diuretics with some success. No acidosis or electrolyte abnormality requiring urgent dialysis. Have discussed dialysis with patient and will discuss with her Nephrologist (Dr Florene Glen) and her daughter. UOP 1L in last 24hrs but likely not all recorded; Creatinine continuing to decrease; down to 3.55 (11/9). Non-interpretable FENa because urine creatinine resulted as <10, which is most likely not accurate. Patient not oliguric. 1. Repeat UA: + Pro (100), Nit, Leu, Hgb (small) Many Bact & Grandular cast 2. Continue lasix 160 IV TID 2. Hyperphosphatemia. Phosphorus 6.5 today. Continue phoslo 667 tidac. 3. Anemia: Hgb 9.8 on admit with Hx of melena and +FOBT on admit. Ferritin 41 with iron, TIBC, B12 and Folate wnl. EGD on 11/3 showed possible short segment Barrett's esophagus, esophagitis and gastritis. 2u pRBC given on 11/1 -> Hgb 8.4. Currently at 8.3 today. Will continue to trend. GI signed off. Probably outpatient colonoscopy and will follow-up biopsies. 1. Iron studies with tsat of 8% - dose Feraheme 510 IV X 2 doses (S/p one dose on 11/6). Last dose on 11/9. On Aranesp 100 QFriday 4. HTN: Stable. Primary managing on Atenolol 50mg  qd, Lasix,  Hydralazine TID 5. DM: SSI  6. HLD: Zocor 7. Asymptomatic bacteruria:  levaquin stopped and urine cultures obtained by primary  Olam Idler, MD 10/30/2014, 7:12 AM PGY-2, [facility]  Patient seen and examined, agree with above note with above modifications. Patient is about the same as when I saw her 5 days ago.  Both BUN and creatinine SLOWLY decreasing which is a good thing as discussed HD would be difficult in this  debilitated woman.  Volume status improving as well.  GIB still seems to be an issue- not sure what plan is for possible colonoscopy - has been transfused and given iron this admission Corliss Parish, MD 10/30/2014

## 2014-10-31 DIAGNOSIS — R0602 Shortness of breath: Secondary | ICD-10-CM

## 2014-10-31 DIAGNOSIS — R609 Edema, unspecified: Secondary | ICD-10-CM

## 2014-10-31 LAB — CBC
HCT: 26.5 % — ABNORMAL LOW (ref 36.0–46.0)
HEMOGLOBIN: 8.4 g/dL — AB (ref 12.0–15.0)
MCH: 29.8 pg (ref 26.0–34.0)
MCHC: 31.7 g/dL (ref 30.0–36.0)
MCV: 94 fL (ref 78.0–100.0)
Platelets: 293 10*3/uL (ref 150–400)
RBC: 2.82 MIL/uL — ABNORMAL LOW (ref 3.87–5.11)
RDW: 18.3 % — ABNORMAL HIGH (ref 11.5–15.5)
WBC: 8 10*3/uL (ref 4.0–10.5)

## 2014-10-31 LAB — GLUCOSE, CAPILLARY
GLUCOSE-CAPILLARY: 173 mg/dL — AB (ref 70–99)
GLUCOSE-CAPILLARY: 176 mg/dL — AB (ref 70–99)
GLUCOSE-CAPILLARY: 177 mg/dL — AB (ref 70–99)
Glucose-Capillary: 228 mg/dL — ABNORMAL HIGH (ref 70–99)

## 2014-10-31 LAB — RENAL FUNCTION PANEL
ANION GAP: 15 (ref 5–15)
Albumin: 2.5 g/dL — ABNORMAL LOW (ref 3.5–5.2)
BUN: 95 mg/dL — AB (ref 6–23)
CHLORIDE: 103 meq/L (ref 96–112)
CO2: 23 meq/L (ref 19–32)
Calcium: 8.9 mg/dL (ref 8.4–10.5)
Creatinine, Ser: 3.51 mg/dL — ABNORMAL HIGH (ref 0.50–1.10)
GFR calc non Af Amer: 12 mL/min — ABNORMAL LOW (ref >90)
GFR, EST AFRICAN AMERICAN: 14 mL/min — AB (ref >90)
GLUCOSE: 198 mg/dL — AB (ref 70–99)
POTASSIUM: 4.6 meq/L (ref 3.7–5.3)
Phosphorus: 5.9 mg/dL — ABNORMAL HIGH (ref 2.3–4.6)
Sodium: 141 mEq/L (ref 137–147)

## 2014-10-31 LAB — UIFE/LIGHT CHAINS/TP QN, 24-HR UR
ALPHA 2 UR: DETECTED — AB
Albumin, U: DETECTED
Alpha 1, Urine: DETECTED — AB
Beta, Urine: DETECTED — AB
Gamma Globulin, Urine: DETECTED — AB
TOTAL PROTEIN, URINE-UPE24: 208 mg/dL — AB (ref 5–24)

## 2014-10-31 MED ORDER — ALPRAZOLAM 0.5 MG PO TABS
0.5000 mg | ORAL_TABLET | Freq: Once | ORAL | Status: AC
  Administered 2014-10-31: 0.5 mg via ORAL

## 2014-10-31 NOTE — Clinical Social Work Note (Signed)
Per RNCM patient is possible LTACH candidate. CSW will continue to follow.  Liz Beach MSW, [place], [place], JI:[id number]

## 2014-10-31 NOTE — Progress Notes (Signed)
[facility] PROGRESS NOTE  RONETTE REBEL [id number] DOB: [birth year] DOA: 10/17/2014 PCP: Shirline Frees, MD  Assessment/Plan: 78 y/o female with PMH of HTN, CKD, CHF, DM presented with SOB, DOE, edema- stay has been complicated by anemia and not being able to do colonoscopy.  Also with AKI on CKD- nephrology following- holding on dialysis currently  Acute on chronic diastolic CHF:  -CXR: BL effusions edema; diuresed >1 L  Remains on high dose iv lasix. Dyspnea waxes and wanes  AKI on CKD IV; creatinine improving with diuresis -nephrology following  HTN (hypertension): stable  Type 2 diabetes mellitus with renal complications: not optimally controlled. Adjusting insulin  Anemia, AoCD/kidney disease with new UGIB and iron deficiency;  -s/p EGD (11/3): Reflux esophagitis, gastritis, duodenitis; cont PPI;  -TF sed 1 unit; on aranesp, Iron;  per GI: If respiratory and functional status improve, consider outpt colonoscopy. H/h stable  UTI: pansensitive E coli. S/p 2 days levaquin. Narrow to keflex. Would treat for 7 days (or more), as she has foley in  Will get nutrition consult and OOB for meals order ?LTAC candidate?  Code Status: full Family Communication: daughter and son Disposition Plan: eventual SNF  Consultants:  Nephrology,. GI  Procedures:  EGD  Antibiotics:    HPI/Subjective: More SOB today c/o right arm swelling  Objective: Filed Vitals:   10/30/14 2221  BP: 134/49  Pulse:   Temp: 97.4 F (36.3 C)  Resp: 18    Intake/Output Summary (Last 24 hours) at 10/31/14 1002 Last data filed at 10/31/14 0654  Gross per 24 hour  Intake    198 ml  Output   1600 ml  Net  -1402 ml   Filed Weights   10/29/14 0540 10/30/14 0500 10/31/14 0500  Weight: 109.408 kg (241 lb 3.2 oz) 110.179 kg (242 lb 14.4 oz) 111.9 kg (246 lb 11.1 oz)    Exam:   General:  Weak appearing. Morbidly obese. Speaking in full sentence  Cardiovascular: s1,s2  rrr  Respiratory: CTA without WRR  Abdomen: soft, nt, obese  Musculoskeletal: lessening edema  Data Reviewed: Basic Metabolic Panel:  Recent Labs Lab 10/26/14 1525 10/27/14 0435 10/28/14 0318 10/29/14 0614 10/30/14 0515  NA 139 144 142 142 144  K 4.7 4.6 4.7 5.1 4.6  CL 104 107 107 106 107  CO2 22 21 20  18* 20  GLUCOSE 218* 165* 172* 172* 151*  BUN 101* 105* 101* 100* 97*  CREATININE 4.53* 4.48* 4.24* 3.71* 3.55*  CALCIUM 8.5 8.2* 8.1* 8.6 8.8  PHOS  --   --  7.8* 7.0* 6.5*   Liver Function Tests:  Recent Labs Lab 10/28/14 0318 10/29/14 0614 10/30/14 0515  ALBUMIN 2.1* 2.5* 2.3*   No results for input(s): LIPASE, AMYLASE in the last 168 hours. No results for input(s): AMMONIA in the last 168 hours. CBC:  Recent Labs Lab 10/26/14 0544 10/27/14 0435 10/28/14 0318 10/29/14 0614 10/30/14 0515  WBC 8.8 9.7 7.8 8.3 7.6  HGB 7.6* 8.0* 7.4* 9.5* 8.3*  HCT 24.0* 24.7* 23.3* 29.6* 26.3*  MCV 94.1 92.2 93.2 94.9 93.9  PLT 238 256 239 245 280   Cardiac Enzymes: No results for input(s): CKTOTAL, CKMB, CKMBINDEX, TROPONINI in the last 168 hours. BNP (last 3 results)  Recent Labs  10/18/14 0727  PROBNP 3262.0*   CBG:  Recent Labs Lab 10/30/14 1222 10/30/14 1635 10/30/14 1729 10/30/14 2225 10/31/14 0801  GLUCAP 189* 205* 193* 155* 173*    Recent Results (from the past 240 hour(s))  Culture, Urine     Status: None   Collection Time: 10/27/14 11:50 AM  Result Value Ref Range Status   Specimen Description URINE, CATHETERIZED  Final   Special Requests NONE  Final   Culture  Setup Time   Final    10/27/2014 19:03 Performed at [place]   Final    >=100,000 COLONIES/ML Performed at [place] Performed at [facility]    Report Status 10/30/2014 FINAL  Final   Organism ID, Bacteria ESCHERICHIA COLI  Final      Susceptibility   Escherichia coli - MIC*     AMPICILLIN <=2 SENSITIVE Sensitive     CEFAZOLIN <=4 SENSITIVE Sensitive     CEFTRIAXONE <=1 SENSITIVE Sensitive     CIPROFLOXACIN <=0.25 SENSITIVE Sensitive     GENTAMICIN <=1 SENSITIVE Sensitive     LEVOFLOXACIN <=0.12 SENSITIVE Sensitive     NITROFURANTOIN <=16 SENSITIVE Sensitive     TOBRAMYCIN <=1 SENSITIVE Sensitive     TRIMETH/SULFA <=20 SENSITIVE Sensitive     PIP/TAZO <=4 SENSITIVE Sensitive     * ESCHERICHIA COLI     Studies: No results found.  Scheduled Meds: . ALPRAZolam  0.25 mg Oral QHS  . aspirin EC  81 mg Oral Daily  . atenolol  50 mg Oral Daily  . calcium acetate  667 mg Oral TID WC  . cephALEXin  250 mg Oral Q12H  . darbepoetin (ARANESP) injection - NON-DIALYSIS  100 mcg Subcutaneous Q Fri-1800  . dorzolamide-timolol  1 drop Left Eye BID  . enoxaparin (LOVENOX) injection  30 mg Subcutaneous Q24H  . furosemide  160 mg Intravenous 3 times per day  . hydrALAZINE  75 mg Oral 3 times per day  . insulin aspart  0-9 Units Subcutaneous TID WC  . insulin aspart  5 Units Subcutaneous TID WC  . insulin glargine  24 Units Subcutaneous QHS  . nystatin cream   Topical BID  . pantoprazole  40 mg Oral Daily  . potassium chloride  20 mEq Oral Daily  . senna-docusate  1 tablet Oral QHS  . simvastatin  20 mg Oral QPM   Continuous Infusions:   Time spent: 35 minutes   Yazlynn Birkeland  [facility] Pager [phone]. If 7PM-7AM, please contact night-coverage at [email or web address], password [facility] 10/31/2014, 10:02 AM  LOS: 14 days

## 2014-10-31 NOTE — Progress Notes (Signed)
Subjective:  States she is feeling okay today. Breathing better. Her daughter is present today and they are interested in dialysis if needed. Making good urine   Objective Vital signs in last 24 hours: Filed Vitals:   10/30/14 0507 10/30/14 1447 10/30/14 2221 10/31/14 0500  BP: 142/55 142/60 134/49   Pulse: 64 64    Temp: 97.5 F (36.4 C) 97.9 F (36.6 C) 97.4 F (36.3 C)   TempSrc: Oral Oral Oral   Resp: 19 18 18    Height:      Weight:    246 lb 11.1 oz (111.9 kg)  SpO2: 98% 98% 99%    Weight change: 3 lb 12.7 oz (1.721 kg)  Intake/Output Summary (Last 24 hours) at 10/31/14 0753 Last data filed at 10/31/14 0654  Gross per 24 hour  Intake    438 ml  Output   3850 ml  Net  -3412 ml   Weight Trending 10/17/14 113.3 kg 10/20/14 110.4 kg  10/27/14 117.8 kg  10/28/14 116.5 kg 10/29/14  109.4 (nadir weight this admit) 11/9        110.179 kg 11/10     111.9kg (unsure if accurate)  Physical Exam:  BP 134/49 mmHg  Pulse 64  Temp(Src) 97.4 F (36.3 C) (Oral)  Resp 18  Ht 5\' 3"  (1.6 m)  Wt 246 lb 11.1 oz (111.9 kg)  BMI 43.71 kg/m2  SpO2 99%  Gen: Obese, laying in bed, in no distress.  Foley in place clear urine CV: RRR no m/r/g Resp: Diffusely decreased breath sounds, likely due to habitus. Mild crackles at bases bilaterally  Ab: SNTND- obese Extremities: 1+  pitting edema of lower extremities and hips  Labs: Basic Metabolic Panel:  Recent Labs Lab 10/25/14 0500 10/26/14 0544 10/26/14 1525 10/27/14 0435 10/28/14 0318 10/29/14 0614 10/30/14 0515  NA 143 144 139 144 142 142 144  K 4.7 4.8 4.7 4.6 4.7 5.1 4.6  CL 108 108 104 107 107 106 107  CO2 20 17* 22 21 20  18* 20  GLUCOSE 169* 185* 218* 165* 172* 172* 151*  BUN 105* 106* 101* 105* 101* 100* 97*  CREATININE 4.74* 4.64* 4.53* 4.48* 4.24* 3.71* 3.55*  CALCIUM 8.4 8.2* 8.5 8.2* 8.1* 8.6 8.8  PHOS  --   --   --   --  7.8* 7.0* 6.5*    Recent Labs Lab 10/27/14 0435 10/28/14 0318 10/29/14 0614  10/30/14 0515  WBC 9.7 7.8 8.3 7.6  HGB 8.0* 7.4* 9.5* 8.3*  HCT 24.7* 23.3* 29.6* 26.3*  MCV 92.2 93.2 94.9 93.9  PLT 256 239 245 280    Recent Labs Lab 10/30/14 0753 10/30/14 1222 10/30/14 1635 10/30/14 1729 10/30/14 2225  GLUCAP 145* 189* 205* 193* 155*   Lab Results  Component Value Date   IRON 25* 10/27/2014   TIBC 328 10/27/2014   FERRITIN 29 10/27/2014   Medications:   . ALPRAZolam  0.25 mg Oral QHS  . aspirin EC  81 mg Oral Daily  . atenolol  50 mg Oral Daily  . calcium acetate  667 mg Oral TID WC  . cephALEXin  250 mg Oral Q12H  . darbepoetin (ARANESP) injection - NON-DIALYSIS  100 mcg Subcutaneous Q Fri-1800  . dorzolamide-timolol  1 drop Left Eye BID  . enoxaparin (LOVENOX) injection  30 mg Subcutaneous Q24H  . furosemide  160 mg Intravenous 3 times per day  . hydrALAZINE  75 mg Oral 3 times per day  . insulin aspart  0-9 Units  Subcutaneous TID WC  . insulin aspart  5 Units Subcutaneous TID WC  . insulin glargine  24 Units Subcutaneous QHS  . nystatin cream   Topical BID  . pantoprazole  40 mg Oral Daily  . potassium chloride  20 mEq Oral Daily  . senna-docusate  1 tablet Oral QHS  . simvastatin  20 mg Oral QPM    Background 78 y.o F w/ PMH of CKD stage 3 (2.3 08/2014 - patient of Dr. Florene Glen) , diastolic heart failure (EF 60% w/ stage 1 diastolic dysfunction), DM, HTN, and anemia who presented on 10/17/14 with exertional dyspnea, worsening lower extremity swelling, weakness 2 weeks and black stools (Hgb 9.8); FOBT +.  Found to have worsening CKD on adm w/ Cr 4.03 (BUN/Cr 17) Last known Cr ~ 2.3 (09/20/14).  Creatinine rose to high of 4.75.  She is azotemic w/ BUN > 100 but does not appear to be clinically uremic at this time. This is not likely to come to a quick resolution. Discussions about dialysis have been started and she is agreeable if it is necessary. No urgent indications at this time. Breathing improved with diuresis.   Assessment/Plan: 1. CKD:  Cr 4.03 on admit with last know Cr ~ 2.3 on 08/2014. AKI on CKD.  At time of our consultation we assessed her as wet and have been increasing diuretics with some success. No acidosis or electrolyte abnormality requiring urgent dialysis. Have discussed dialysis with patient, her Nephrologist (Dr Florene Glen) and her daughter who are open to dialysis if needed but I am concerned because I am not sure patient would do well given marginal functional status. UOP 3.8L in last 24hrs; Creatinine continuing to decrease- peaked at 4.7; down to 3.55 (11/9).  Patient not oliguric. 1. Repeat UA: + Pro (100), Nit, Leu, Hgb (small) Many Bact & Granular cast (11/5) 2. Continue lasix 160 IV TID as producing good diuresis and improving SOB- will look to decrease dose soon 2. Hyperphosphatemia. Phosphorus 6.5 11/9. Continue phoslo 667 tidac. 3. Anemia: Hgb 9.8 on admit with Hx of melena and +FOBT on admit. Ferritin 41 with iron, TIBC, B12 and Folate wnl. EGD on 11/3 showed possible short segment Barrett's esophagus, esophagitis and gastritis. 2u pRBC given on 11/1 -> Hgb 8.4. Currently at 8.3 11/9. Will continue to trend. GI signed off. Probably outpatient colonoscopy and will follow-up biopsies.  1. Iron studies with tsat of 8% - dose Feraheme 510 IV X 2 doses (S/p one dose on 11/6). Last dose on 11/9. On Aranesp 100 QFriday 4. HTN: Stable. Primary managing on Atenolol 50mg  qd, Lasix,  Hydralazine TID- may be able to wean BP meds as volume status improves 5. DM: SSI  6. HLD: Zocor 7. Asymptomatic bacteruria:  levaquin stopped and urine cultures obtained by primary = > 100K of Ecoli. On Keflex (250mg ) BID s/p Levaquin (Day 4)  Olam Idler, MD 10/31/2014, 7:53 AM PGY-2, [place] [facility]  Patient seen and examined, agree with above note with above modifications. Patient is about the same as when I saw her 5 days ago.  Both BUN and creatinine SLOWLY decreasing which is a good thing as discussed HD would be  difficult in this debilitated woman.  Volume status improving as well.  GIB maybe stable- I think now focus should be on rehab - improving strength and fine tuning OP diuretic regimen.  If labs stay stable to improved discharge planning   Corliss Parish, MD 10/31/2014

## 2014-10-31 NOTE — Progress Notes (Signed)
Right upper extremity venous duplex completed:  No evidence of DVT or superficial thrombosis.    

## 2014-10-31 NOTE — Progress Notes (Signed)
CARE MANAGEMENT NOTE 10/31/2014  Patient:  [name] James, [name] James   Account Number:  [id number]  Date Initiated:  10/20/2014  Documentation initiated by:  Tomi Bamberger  Subjective/Objective Assessment:   dx aki  admit- lives with son.     Action/Plan:   pt rec snf.   Anticipated DC Date:  11/01/2014   Anticipated DC Plan:  LONG TERM ACUTE CARE (LTAC)  In-house referral  Clinical Social Worker      DC Planning Services  CM consult      [facility] Choice  LONG TERM ACUTE CARE   Choice offered to / List presented to:  C-4 Adult Children           Status of service:  In process, will continue to follow Medicare Important Message given?  YES (If response is "NO", the following Medicare IM given date fields will be blank) Date Medicare IM given:  10/20/2014 Medicare IM given by:  Tomi Bamberger Date Additional Medicare IM given:  10/30/2014 Additional Medicare IM given by:  [facility]  Discharge Disposition:    Per UR Regulation:  Reviewed for med. necessity/level of care/duration of stay  If discussed at [place] of Stay Meetings, dates discussed:    Comments:  10/31/14 [place], BSN  [phone] NCM spoke with patient ,her daughter and her son [name], they will come tomorrow to look at [facility], and Tommy with [facility] states they will have a bed for patient tomorrow. NCM notiified CSW and MD of this information and Charge RN.  10/20/14 [place], BSN [phone] per physical therapy rec snf, NCM spoke with patient and she states she has been in [facility] before,  she states she agress to go to a facility for rehab to get stronger before going home.  NCM informed [name], the CSW.

## 2014-11-01 LAB — CBC
HCT: 25.9 % — ABNORMAL LOW (ref 36.0–46.0)
Hemoglobin: 8.2 g/dL — ABNORMAL LOW (ref 12.0–15.0)
MCH: 29.7 pg (ref 26.0–34.0)
MCHC: 31.7 g/dL (ref 30.0–36.0)
MCV: 93.8 fL (ref 78.0–100.0)
PLATELETS: 278 10*3/uL (ref 150–400)
RBC: 2.76 MIL/uL — AB (ref 3.87–5.11)
RDW: 18.4 % — ABNORMAL HIGH (ref 11.5–15.5)
WBC: 7.9 10*3/uL (ref 4.0–10.5)

## 2014-11-01 LAB — GLUCOSE, CAPILLARY
GLUCOSE-CAPILLARY: 150 mg/dL — AB (ref 70–99)
Glucose-Capillary: 228 mg/dL — ABNORMAL HIGH (ref 70–99)
Glucose-Capillary: 196 mg/dL — ABNORMAL HIGH (ref 70–99)

## 2014-11-01 LAB — BASIC METABOLIC PANEL
Anion gap: 14 (ref 5–15)
BUN: 94 mg/dL — ABNORMAL HIGH (ref 6–23)
CHLORIDE: 103 meq/L (ref 96–112)
CO2: 23 meq/L (ref 19–32)
CREATININE: 3.48 mg/dL — AB (ref 0.50–1.10)
Calcium: 8.7 mg/dL (ref 8.4–10.5)
GFR calc non Af Amer: 12 mL/min — ABNORMAL LOW (ref >90)
GFR, EST AFRICAN AMERICAN: 14 mL/min — AB (ref >90)
Glucose, Bld: 166 mg/dL — ABNORMAL HIGH (ref 70–99)
POTASSIUM: 4.9 meq/L (ref 3.7–5.3)
Sodium: 140 mEq/L (ref 137–147)

## 2014-11-01 MED ORDER — DIAZEPAM 5 MG PO TABS
5.0000 mg | ORAL_TABLET | Freq: Every evening | ORAL | Status: DC | PRN
  Administered 2014-11-01: 5 mg via ORAL
  Filled 2014-11-01: qty 1

## 2014-11-01 MED ORDER — ALPRAZOLAM 0.5 MG PO TABS: 0.5000 mg | ORAL_TABLET | Freq: Every day | ORAL | Status: DC

## 2014-11-01 MED ORDER — NEPRO/CARBSTEADY PO LIQD
237.0000 mL | ORAL | Status: DC
  Administered 2014-11-01: 237 mL via ORAL

## 2014-11-01 MED ORDER — FUROSEMIDE 80 MG PO TABS
160.0000 mg | ORAL_TABLET | Freq: Three times a day (TID) | ORAL | Status: DC
  Administered 2014-11-01 – 2014-11-02 (×4): 160 mg via ORAL
  Filled 2014-11-01 (×6): qty 2

## 2014-11-01 MED ORDER — ALPRAZOLAM 0.25 MG PO TABS: 0.2500 mg | ORAL_TABLET | Freq: Every day | ORAL | Status: DC | PRN

## 2014-11-01 NOTE — Progress Notes (Signed)
Physical Therapy Treatment Patient Details Name: [name] James MRN: [id number] DOB: [birth year] Today's Date: 11/01/2014    History of Present Illness 78 yo female with onset CHF and EF 55%, DM and claudication issues to reduce LE edema.      PT Comments    Patient continues to progress slowly towards physical therapy goals. Limited by fatigue and dizziness (see vitals below) today but willing to work with therapy focusing on gait training and therapeutic exercises. Patient will continue to benefit from skilled physical therapy services to further improve independence with functional mobility.   Follow Up Recommendations  SNF;Supervision/Assistance - 24 hour     Equipment Recommendations  None recommended by PT    Recommendations for Other Services       Precautions / Restrictions Precautions Precautions: Fall Restrictions Weight Bearing Restrictions: No    Mobility  Bed Mobility                  Transfers Overall transfer level: Needs assistance Equipment used: Rolling walker (2 wheeled) Transfers: Sit to/from Stand Sit to Stand: Min assist         General transfer comment: Min assist initially from reclining chair. Progressed to min guard with VC for technique. Slow to rise but good stability once holding RW. Performed x3 from recliner.  Ambulation/Gait Ambulation/Gait assistance: Min guard Ambulation Distance (Feet): 10 Feet Assistive device: Rolling walker (2 wheeled) Gait Pattern/deviations: Step-through pattern;Decreased stride length;Trunk flexed;Shuffle Gait velocity: slow   General Gait Details: Very small shuffled feet. Able to ambulate 5 feet forwards but became dizzy and stepped backwards 5 feet back to chair. VC for larger steps to improve foot clearance. Also performed pre-gait training with static standing for LE tolerance. Pt max standing time of 2 minutes before having to sit due to fatigue. Close min guard for safety.   Stairs             Wheelchair Mobility    Modified Rankin (Stroke Patients Only)       Balance                                    Cognition Arousal/Alertness: Awake/alert Behavior During Therapy: WFL for tasks assessed/performed Overall Cognitive Status: Within Functional Limits for tasks assessed                      Exercises General Exercises - Lower Extremity Ankle Circles/Pumps: AROM;10 reps;Both;Seated Quad Sets: Strengthening;Both;10 reps;Seated Gluteal Sets: 10 reps;Seated;Strengthening Long Arc Quad: Both;10 reps;Seated;Strengthening Hip Flexion/Marching: Both;10 reps;Seated;Strengthening    General Comments General comments (skin integrity, edema, etc.): Dizziness upon standing (see vitals below)      Pertinent Vitals/Pain Pain Assessment: No/denies pain   Sitting: BP 139/49 HR 59 Standing: BP 115/57 HR 90 Reports dizziness with standing    Home Living                      Prior Function            PT Goals (current goals can now be found in the care plan section) Acute Rehab PT Goals Patient Stated Goal: get stronger PT Goal Formulation: With patient Time For Goal Achievement: 11/03/14 Potential to Achieve Goals: Fair Progress towards PT goals: Progressing toward goals    Frequency  Min 3X/week    PT Plan Current plan remains appropriate    Co-evaluation  End of Session Equipment Utilized During Treatment: Gait belt;Oxygen (2L) Activity Tolerance: Patient limited by fatigue;Other (comment) (and dizziness) Patient left: in chair;with call bell/phone within reach;with chair alarm set     Time: [id number] PT Time Calculation (min) (ACUTE ONLY): 36 min  Charges:  $Gait Training: 8-22 mins $Therapeutic Exercise: 8-22 mins                    G Codes:      Ellouise Newer 2014/11/13, 12:49 PM  Camille Bal [place], [place]

## 2014-11-01 NOTE — Progress Notes (Addendum)
[facility] PROGRESS NOTE  [name] James [id number] DOB: [birth year] DOA: 10/17/2014 PCP: Shirline Frees, MD  Assessment/Plan: 78 y/o female with PMH of HTN, CKD, CHF, DM presented with SOB, DOE, edema- stay has been complicated by anemia and not being able to do colonoscopy.  Also with AKI on CKD- nephrology following- holding on dialysis currently  Acute on chronic diastolic CHF:  -CXR: BL effusions edema- improving; diuresed >13L  Changed to  PO lasix 11/11 - may re-occur- could consider thoracentesis if they do re-occur  AKI on CKD IV; creatinine improving with diuresis -nephrology signed off as patient is improving  HTN (hypertension): stable  Type 2 diabetes mellitus with renal complications: not optimally controlled. Adjusting insulin  Anemia, AoCD/kidney disease with new UGIB and iron deficiency;  -s/p EGD (11/3): Reflux esophagitis, gastritis, duodenitis; cont PPI;  -TF sed 1 unit; on aranesp, Iron;  per GI: If respiratory and functional status improve, consider outpt colonoscopy. H/h stable  UTI: pansensitive E coli. keflex. Would treat for 7 days total (or more), as she has foley in- plan to d/c foley in AM  Family has the expectation that patient will stay here in hospital until she is rehabbed- upset that she will need to go to SNF.  Decided against LTAC as she would have new doctors.  Spoke at length that she is nearing need to go to SNF.  She is a high risk to bounce back as she has multiple chronic medical issues.     Code Status: full Family Communication: daughter and son on phone Disposition Plan: SNF in AM  Consultants:  Nephrology,. GI  Procedures:  EGD  Antibiotics:    HPI/Subjective: Patient and family have decided against LTAC want [place] SNF +BM today- no blood, normal color and consistency   Objective: Filed Vitals:   11/01/14 0519  BP: 144/58  Pulse: 59  Temp: 97.4 F (36.3 C)  Resp: 18    Intake/Output Summary  (Last 24 hours) at 11/01/14 1141 Last data filed at 11/01/14 0900  Gross per 24 hour  Intake    900 ml  Output   2050 ml  Net  -1150 ml   Filed Weights   10/30/14 0500 10/31/14 0500 11/01/14 0519  Weight: 110.179 kg (242 lb 14.4 oz) 111.9 kg (246 lb 11.1 oz) 112 kg (246 lb 14.6 oz)    Exam:   General:  Weak appearing. Morbidly obese. Speaking in full sentence  Cardiovascular: s1,s2 rrr  Respiratory: CTA without WRR  Abdomen: soft, nt, obese  Musculoskeletal: lessening edema  Data Reviewed: Basic Metabolic Panel:  Recent Labs Lab 10/28/14 0318 10/29/14 0614 10/30/14 0515 10/31/14 1013 11/01/14 0050  NA 142 142 144 141 140  K 4.7 5.1 4.6 4.6 4.9  CL 107 106 107 103 103  CO2 20 18* 20 23 23   GLUCOSE 172* 172* 151* 198* 166*  BUN 101* 100* 97* 95* 94*  CREATININE 4.24* 3.71* 3.55* 3.51* 3.48*  CALCIUM 8.1* 8.6 8.8 8.9 8.7  PHOS 7.8* 7.0* 6.5* 5.9*  --    Liver Function Tests:  Recent Labs Lab 10/28/14 0318 10/29/14 0614 10/30/14 0515 10/31/14 1013  ALBUMIN 2.1* 2.5* 2.3* 2.5*   No results for input(s): LIPASE, AMYLASE in the last 168 hours. No results for input(s): AMMONIA in the last 168 hours. CBC:  Recent Labs Lab 10/28/14 0318 10/29/14 0614 10/30/14 0515 10/31/14 1013 11/01/14 0050  WBC 7.8 8.3 7.6 8.0 7.9  HGB 7.4* 9.5* 8.3* 8.4* 8.2*  HCT 23.3*  29.6* 26.3* 26.5* 25.9*  MCV 93.2 94.9 93.9 94.0 93.8  PLT 239 245 280 293 278   Cardiac Enzymes: No results for input(s): CKTOTAL, CKMB, CKMBINDEX, TROPONINI in the last 168 hours. BNP (last 3 results)  Recent Labs  10/18/14 0727  PROBNP 3262.0*   CBG:  Recent Labs Lab 10/31/14 0801 10/31/14 1217 10/31/14 1753 10/31/14 2229 11/01/14 0803  GLUCAP 173* 228* 176* 177* 150*    Recent Results (from the past 240 hour(s))  Culture, Urine     Status: None   Collection Time: 10/27/14 11:50 AM  Result Value Ref Range Status   Specimen Description URINE, CATHETERIZED  Final   Special  Requests NONE  Final   Culture  Setup Time   Final    10/27/2014 19:03 Performed at [place]   Final    >=100,000 COLONIES/ML Performed at [place] Performed at [facility]    Report Status 10/30/2014 FINAL  Final   Organism ID, Bacteria ESCHERICHIA COLI  Final      Susceptibility   Escherichia coli - MIC*    AMPICILLIN <=2 SENSITIVE Sensitive     CEFAZOLIN <=4 SENSITIVE Sensitive     CEFTRIAXONE <=1 SENSITIVE Sensitive     CIPROFLOXACIN <=0.25 SENSITIVE Sensitive     GENTAMICIN <=1 SENSITIVE Sensitive     LEVOFLOXACIN <=0.12 SENSITIVE Sensitive     NITROFURANTOIN <=16 SENSITIVE Sensitive     TOBRAMYCIN <=1 SENSITIVE Sensitive     TRIMETH/SULFA <=20 SENSITIVE Sensitive     PIP/TAZO <=4 SENSITIVE Sensitive     * ESCHERICHIA COLI     Studies: No results found.  Scheduled Meds: . aspirin EC  81 mg Oral Daily  . atenolol  50 mg Oral Daily  . calcium acetate  667 mg Oral TID WC  . cephALEXin  250 mg Oral Q12H  . darbepoetin (ARANESP) injection - NON-DIALYSIS  100 mcg Subcutaneous Q Fri-1800  . dorzolamide-timolol  1 drop Left Eye BID  . enoxaparin (LOVENOX) injection  30 mg Subcutaneous Q24H  . furosemide  160 mg Oral TID  . hydrALAZINE  75 mg Oral 3 times per day  . insulin aspart  0-9 Units Subcutaneous TID WC  . insulin aspart  5 Units Subcutaneous TID WC  . insulin glargine  24 Units Subcutaneous QHS  . nystatin cream   Topical BID  . pantoprazole  40 mg Oral Daily  . potassium chloride  20 mEq Oral Daily  . senna-docusate  1 tablet Oral QHS  . simvastatin  20 mg Oral QPM   Continuous Infusions:   Time spent: 25 minutes   Alece Koppel  [facility] Pager [phone]. If 7PM-7AM, please contact night-coverage at [email or web address], password [facility] 11/01/2014, 11:41 AM  LOS: 15 days

## 2014-11-01 NOTE — Progress Notes (Signed)
INITIAL NUTRITION ASSESSMENT  DOCUMENTATION CODES Per approved criteria  -Morbid Obesity   INTERVENTION: Nepro Shake po daily, each supplement provides 425 kcal and 19 grams protein  NUTRITION DIAGNOSIS: Inadequate oral intake related to decreased appetite as evidenced by PO: 25-95%.   Goal: Pt will meet >90% of estimated nutritional needs  Monitor:  PO/supplement intake, labs, weight changes, I/O's  Reason for Assessment: Consult for poor po's  78 y.o. female  Admitting Dx: Acute on chronic diastolic CHF (congestive heart failure)  78 y/o female with PMH of HTN, CKD, CHF, DM presented with SOB, DOE, edema- stay has been complicated by anemia and not being able to do colonoscopy. Also with AKI on CKD- nephrology following- holding on dialysis currently  ASSESSMENT: Pt reports poor appetite for the past 2-3 months. She denies any difficulty chewing or swallowing, but complains of a lack of desire to eat. SHe reports "food just doesn't taste good anymore". Meal completion has been variable since admission; PO: 25-95%.  Pt denies any recent wt loss and reveals wt fluctuates at baseline due to diuretic use.  Pt was initially hesitant to try supplements because "they put weight on you". Educated pt on importance of good PO intake to promote healing process. Discussed available supplements on formulary pt would like to try Nepro.  Nutrition-focused physical exam revealed no signs of muscle depletion, but nted significant edema on legs and rt arm.  Discharge disposition is to SNF once medically stable. Family has declined LTAC.  Labs reviewed. BUN/Creat: 94/3.38, Glucose: 166, Phos: 5.9. K WDL.   Height: Ht Readings from Last 1 Encounters:  10/17/14 5\' 3"  (1.6 m)    Weight: Wt Readings from Last 1 Encounters:  11/01/14 246 lb 14.6 oz (112 kg)    Ideal Body Weight: 115#  % Ideal Body Weight: 214%  Wt Readings from Last 10 Encounters:  11/01/14 246 lb 14.6 oz (112 kg)   04/02/14 233 lb (105.688 kg)  03/16/14 242 lb (109.77 kg)  01/16/14 241 lb (109.317 kg)    Usual Body Weight: 241#  % Usual Body Weight: 102%  BMI:  Body mass index is 43.75 kg/(m^2). Extreme obesity, class III.   Estimated Nutritional Needs: Kcal: 1800-2000 Protein: 74-84 grams Fluid: 1.8-2.0 L  Skin: MASD on sacrum  Diet Order: Diet heart healthy/carb modified  EDUCATION NEEDS: -Education needs addressed   Intake/Output Summary (Last 24 hours) at 11/01/14 1209 Last data filed at 11/01/14 0900  Gross per 24 hour  Intake    900 ml  Output   2050 ml  Net  -1150 ml    Last BM: 10/31/14  Labs:   Recent Labs Lab 10/29/14 0614 10/30/14 0515 10/31/14 1013 11/01/14 0050  NA 142 144 141 140  K 5.1 4.6 4.6 4.9  CL 106 107 103 103  CO2 18* 20 23 23   BUN 100* 97* 95* 94*  CREATININE 3.71* 3.55* 3.51* 3.48*  CALCIUM 8.6 8.8 8.9 8.7  PHOS 7.0* 6.5* 5.9*  --   GLUCOSE 172* 151* 198* 166*    CBG (last 3)   Recent Labs  10/31/14 1753 10/31/14 2229 11/01/14 0803  GLUCAP 176* 177* 150*    Scheduled Meds: . aspirin EC  81 mg Oral Daily  . atenolol  50 mg Oral Daily  . calcium acetate  667 mg Oral TID WC  . cephALEXin  250 mg Oral Q12H  . darbepoetin (ARANESP) injection - NON-DIALYSIS  100 mcg Subcutaneous Q Fri-1800  . dorzolamide-timolol  1 drop  Left Eye BID  . enoxaparin (LOVENOX) injection  30 mg Subcutaneous Q24H  . furosemide  160 mg Oral TID  . hydrALAZINE  75 mg Oral 3 times per day  . insulin aspart  0-9 Units Subcutaneous TID WC  . insulin aspart  5 Units Subcutaneous TID WC  . insulin glargine  24 Units Subcutaneous QHS  . nystatin cream   Topical BID  . pantoprazole  40 mg Oral Daily  . potassium chloride  20 mEq Oral Daily  . senna-docusate  1 tablet Oral QHS  . simvastatin  20 mg Oral QPM    Continuous Infusions:   Past Medical History  Diagnosis Date  . CHF (congestive heart failure) 05/13/2011    echo - [id number]; stage 1 diastolic  dysfunction; elevated LV filling pressure, MAC  . Chest pain, atypical 12/05/2008    R/Lmv- normal perfusion all regions, noe ECG changes   . Renal insufficiency 10/30/2005    doppler - abn resistance consistent w/ parenchymal disease  . Claudication 10/30/2005    doppler - normal evaluation, no evidence of aneurysm, diameter reduction, dissection, compression or vascular abnormality)  . DM (diabetes mellitus)   . Peripheral neuropathy   . Hypertension   . Edema   . Gout   . Anxiety   . Arthritis   . Cataract   . GERD (gastroesophageal reflux disease)   . Breast cancer 1980s    bil mastectomies, no radiation or chemo    Past Surgical History  Procedure Laterality Date  . Mastectomy Bilateral ~1982    bil breast reconstruction with implants  . Back surgery  ~1980  . Hemorrhoid surgery    . Cholecystectomy    . Eye surgery    . Orif tibia & fibula fractures Left 2007    also had left non displaced malleolar fracture.   . Esophagogastroduodenoscopy N/A 10/24/2014    Procedure: ESOPHAGOGASTRODUODENOSCOPY (EGD);  Surgeon: Jerene Bears, MD;  Location[facility] ENDOSCOPY;  Service: Endoscopy;  Laterality: N/A;    Muslima Toppins A. Jimmye Norman, RD, LDN Pager: [phone] After hours Pager: [phone]

## 2014-11-01 NOTE — Progress Notes (Signed)
CARE MANAGEMENT NOTE 11/01/2014  Patient:  [name] James, [name] James   Account Number:  [id number]  Date Initiated:  10/20/2014  Documentation initiated by:  Tomi Bamberger  Subjective/Objective Assessment:   dx aki  admit- lives with son.     Action/Plan:   pt rec snf.   Anticipated DC Date:  11/01/2014   Anticipated DC Plan:  LONG TERM ACUTE CARE (LTAC)  In-house referral  Clinical Social Worker      DC Planning Services  CM consult      PAC Choice  LONG TERM ACUTE CARE   Choice offered to / List presented to:  C-4 Adult Children           Status of service:  Completed, signed off Medicare Important Message given?  YES (If response is "NO", the following Medicare IM given date fields will be blank) Date Medicare IM given:  10/20/2014 Medicare IM given by:  Tomi Bamberger Date Additional Medicare IM given:  10/30/2014 Additional Medicare IM given by:  [facility]  Discharge Disposition:    Per UR Regulation:  Reviewed for med. necessity/level of care/duration of stay  If discussed at [place] of Stay Meetings, dates discussed:    Comments:  11/01/2014 1700 NCM was able to speak to pt and gave permission to speak to son, Donnie. Pt states she understands she does need to transfer to [facility] or go to SNF tomorrow. States her dtr will be here this evening and they will make final decision. Son states they fully understand and he was very appreciative of NCM/CSW assistance. He felt his sister may have misunderstood process. They are reconsidering [facility] placement. [name] RN CCM Case Mgmt phone [phone]  11/01/2014 1039 NCM spoke to pt and requested speak to Beecher Mcardle # [phone] about transfer to [facility]. NCM contacted dtr and she was upset that pt was being tranferred to [facility]. NCM explained to dtr that pt and family has to sign consent for transfer to [facility]. States she did not like the facility and did not want her to transfer. Discussed other options  such as [facility] or SNF. Explained that when pt was medically ready for transfer to another level of care that insurance would not cover for continued IP stay. States she is agreeable to SNF placement when medically ready. Explained NCM will make CSW aware and they will arrange SNF placement. CSW referral for SNF placement. Made attending aware dtr was did not want transfer to [facility]. Notified [facility] rep of family's decision. [name] RN CCM Case Mgmt phone [phone]   10/31/14 [place], BSN  [phone] NCM spoke with patient ,her daughter and her son [name], they will come tomorrow to look at [facility], and Tommy with [facility] states they will have a bed for patient tomorrow. NCM notiified CSW and MD of this information and Charge RN.  10/20/14 [place], BSN [phone] per physical therapy rec snf, NCM spoke with patient and she states she has been in [facility] before,  she states she agress to go to a facility for rehab to get stronger before going home.  NCM informed [name], the CSW.

## 2014-11-01 NOTE — Progress Notes (Signed)
Subjective:  States she is feeling okay today. Making good urine. Complains of trouble sleeping - "Xanax got messed up" Is nervous about her disposition  Objective Vital signs in last 24 hours: Filed Vitals:   10/31/14 1337 10/31/14 2202 10/31/14 2234 11/01/14 0519  BP: 134/61 128/39 153/46 144/58  Pulse: 63 62 63 59  Temp: 97.5 F (36.4 C) 97.5 F (36.4 C)  97.4 F (36.3 C)  TempSrc: Oral Oral  Oral  Resp: 16 18  18   Height:      Weight:    246 lb 14.6 oz (112 kg)  SpO2: 99% 98% 98% 100%   Weight change: 3.5 oz (0.1 kg)  Intake/Output Summary (Last 24 hours) at 11/01/14 0740 Last data filed at 11/01/14 0523  Gross per 24 hour  Intake   1060 ml  Output   2050 ml  Net   -990 ml   Weight Trending 10/17/14 113.3 kg 10/20/14 110.4 kg  10/27/14 117.8 kg  10/28/14 116.5 kg 10/29/14  109.4 (nadir weight this admit) 11/9        110.179 kg 11/10     111.9kg (unsure if accurate)  Physical Exam:  BP 144/58 mmHg  Pulse 59  Temp(Src) 97.4 F (36.3 C) (Oral)  Resp 18  Ht 5\' 3"  (1.6 m)  Wt 246 lb 14.6 oz (112 kg)  BMI 43.75 kg/m2  SpO2 100%  Gen: Obese, laying in bed, in no distress.  Foley in place clear urine CV: RRR no m/r/g Resp: Diffusely decreased breath sounds, likely due to habitus. Mild crackles at bases bilaterally  Ab: SNTND- obese Extremities: 1+  pitting edema of lower extremities and hips  Labs: Basic Metabolic Panel:  Recent Labs Lab 10/26/14 1525 10/27/14 0435 10/28/14 0318 10/29/14 0614 10/30/14 0515 10/31/14 1013 11/01/14 0050  NA 139 144 142 142 144 141 140  K 4.7 4.6 4.7 5.1 4.6 4.6 4.9  CL 104 107 107 106 107 103 103  CO2 22 21 20  18* 20 23 23   GLUCOSE 218* 165* 172* 172* 151* 198* 166*  BUN 101* 105* 101* 100* 97* 95* 94*  CREATININE 4.53* 4.48* 4.24* 3.71* 3.55* 3.51* 3.48*  CALCIUM 8.5 8.2* 8.1* 8.6 8.8 8.9 8.7  PHOS  --   --  7.8* 7.0* 6.5* 5.9*  --     Recent Labs Lab 10/29/14 0614 10/30/14 0515 10/31/14 1013 11/01/14 0050   WBC 8.3 7.6 8.0 7.9  HGB 9.5* 8.3* 8.4* 8.2*  HCT 29.6* 26.3* 26.5* 25.9*  MCV 94.9 93.9 94.0 93.8  PLT 245 280 293 278    Recent Labs Lab 10/30/14 2225 10/31/14 0801 10/31/14 1217 10/31/14 1753 10/31/14 2229  GLUCAP 155* 173* 228* 176* 177*   Lab Results  Component Value Date   IRON 25* 10/27/2014   TIBC 328 10/27/2014   FERRITIN 29 10/27/2014   Medications:   . ALPRAZolam  0.25 mg Oral QHS  . aspirin EC  81 mg Oral Daily  . atenolol  50 mg Oral Daily  . calcium acetate  667 mg Oral TID WC  . cephALEXin  250 mg Oral Q12H  . darbepoetin (ARANESP) injection - NON-DIALYSIS  100 mcg Subcutaneous Q Fri-1800  . dorzolamide-timolol  1 drop Left Eye BID  . enoxaparin (LOVENOX) injection  30 mg Subcutaneous Q24H  . furosemide  160 mg Intravenous 3 times per day  . hydrALAZINE  75 mg Oral 3 times per day  . insulin aspart  0-9 Units Subcutaneous TID WC  . insulin  aspart  5 Units Subcutaneous TID WC  . insulin glargine  24 Units Subcutaneous QHS  . nystatin cream   Topical BID  . pantoprazole  40 mg Oral Daily  . potassium chloride  20 mEq Oral Daily  . senna-docusate  1 tablet Oral QHS  . simvastatin  20 mg Oral QPM    Background 78 y.o F w/ PMH of CKD stage 3 (2.3 08/2014 - patient of Dr. Florene Glen) , diastolic heart failure (EF 60% w/ stage 1 diastolic dysfunction), DM, HTN, and anemia who presented on 10/17/14 with exertional dyspnea, worsening lower extremity swelling, weakness 2 weeks and black stools (Hgb 9.8); FOBT +.  Found to have worsening CKD on adm w/ Cr 4.03 (BUN/Cr 17) Last known Cr ~ 2.3 (09/20/14).  Creatinine rose to high of 4.75.  She was azotemic w/ BUN > 100 but did not appear to be clinically uremic. This is not likely to come to a quick resolution. Discussions about dialysis have been started and she is agreeable if it is necessary. No urgent indications at this time. Breathing improved with diuresis.   Assessment/Plan: 1. CKD: Cr 4.03 on admit with last  know Cr ~ 2.3 on 08/2014. AKI on CKD.  At time of our consultation we assessed her as wet and have been increasing diuretics with some success. No acidosis or electrolyte abnormality requiring urgent dialysis. Have discussed dialysis with patient, her Nephrologist (Dr Florene Glen) and her daughter who are open to dialysis if needed but I am concerned because I am not sure patient would do well given marginal functional status. UOP-2L in last 24hrs; Creatinine and BUN continuing to decrease slowly with diuresis- peaked at 4.7; down to 3.48 (11/11) BUN 94.  Patient not oliguric. 1. Repeat UA: + Pro (100), Nit, Leu, Hgb (small) Many Bact & Granular cast (11/5) 2. Switch lasix to 160 PO TID as producing good diuresis and improving SOB in anticipation of discharge 1.  Will arrange follow-up with Dr Florene Glen as outpatient  3. Hyperphosphatemia. Phosphorus 6.5 11/9. Continue phoslo 667 tidac. 4. Anemia: Hgb 9.8 on admit with Hx of melena and +FOBT on admit. Ferritin 41 with iron, TIBC, B12 and Folate wnl. EGD on 11/3 showed possible short segment Barrett's esophagus, esophagitis and gastritis. 2u pRBC given on 11/1 -> Hgb 8.4. Currently stable at ~  8 11/11. Will continue to trend. GI signed off. Probably outpatient colonoscopy and will follow-up biopsies.  1. Iron studies with tsat of 8% - dose Feraheme 510 IV X 2 doses (S/p one dose on 11/6). Last dose on 11/9. On Aranesp 100 QFriday 5. HTN: Stable. Primary managing on Atenolol 50mg  qd, Lasix,  Hydralazine TID- may be able to wean BP meds as volume status improves 6. DM: SSI  7. HLD: Zocor 8. Asymptomatic bacteruria:  levaquin stopped and urine cultures obtained by primary = > 100K of Ecoli. On Keflex (250mg ) BID s/p Levaquin (Day 5)  Olam Idler, MD 11/01/2014, 7:40 AM PGY-2, [facility]  Patient seen and examined, agree with above note with above modifications. Chronically ill female with some A on CRF in the setting of volume overload and  GIB- is improving slowly, no indications for HD.  I have changed over her diuretics to 160 TID PO as I still feel there is fluid to get.  If she goes to an LTAC she can be followed by nephrology there and diuretics can be titrated as needed.  I have put in for patient to  have appt with Dr. Florene Glen in about 8 weeks at [place] .  Renal will sign off, call with questions Corliss Parish, MD 11/01/2014

## 2014-11-01 NOTE — Clinical Social Work Note (Signed)
CSW spoke with patient's daughter by phone as per [facility], patient and family are refusing LTACH placement at this time. Daughter was noticeably upset when CSW introduced self. She states, "Why are you calling to bother me, we are not making any decisions about where she is going. You have scared my mother into a corner." CSW apologized to patient's daughter in regards to interactions with other staff, but explained reason for call. CSW explained that the patient will likely be ready for DC tomorrow and wanted to discuss family/patient's decision on disposition for patient. CSW explained that if patient is discharged, the patient would have the options to discharge to [facility], SNF, or home. Daughter continued to talk over CSW and repeatedly stated that she would be making several complaints. Daughter stated that they will just refuse to have patient discharge and "you will just have to keep her." CSW explained to daughter that if patient is discharged the patient and family does have the right to an appeal if the family/patient feels that it is medically necessary for the patient to remain in the hospital. CSW explained that RNCM can provide more information regarding this if they choose to appeal the discharge. CSW apologized again to daughter for what she feels has been a negative experience. CSW willl follow up with family tomorrow.  Liz Beach MSW, [place], [place], JI:[id number]

## 2014-11-01 NOTE — Plan of Care (Signed)
Problem: Phase III Progression Outcomes Goal: Activity at appropriate level-compared to baseline (UP IN CHAIR FOR HEMODIALYSIS)  Outcome: Progressing     

## 2014-11-01 NOTE — Progress Notes (Signed)
CARE MANAGEMENT NOTE 11/01/2014  Patient:  [name] James, [name] James   Account Number:  [id number]  Date Initiated:  10/20/2014  Documentation initiated by:  [name] James  Subjective/Objective Assessment:   dx aki  admit- lives with son.     Action/Plan:   pt rec snf.   Anticipated DC Date:  11/01/2014   Anticipated DC Plan:  LONG TERM ACUTE CARE (LTAC)  In-house referral  Clinical Social Worker      DC Planning Services  CM consult      [place] [facility] Choice  LONG TERM ACUTE CARE   Choice offered to / List presented to:  C-4 Adult Children           Status of service:  In process, will continue to follow Medicare Important Message given?  YES (If response is "NO", the following Medicare IM given date fields will be blank) Date Medicare IM given:  10/20/2014 Medicare IM given by:  [name] James Date Additional Medicare IM given:  10/30/2014 Additional Medicare IM given by:  [name] James  Discharge Disposition:    Per UR Regulation:  Reviewed for med. necessity/level of care/duration of stay  If discussed at [place] of Stay Meetings, dates discussed:    Comments:  11/01/2014 1039 NCM spoke to pt and requested speak to [name] James # [phone] about transfer to [facility]. NCM contacted dtr and she was upset that pt was being tranferred to [facility]. NCM explained to dtr that pt and family has to sign consent for transfer to [facility]. States she did not like the facility and did not want her to transfer. Discussed other options such as [facility] or SNF. Explained that when pt was medically ready for transfer to another level of care that insurance would not cover for continued IP stay. States she is agreeable to SNF placement when medically ready. Explained NCM will make CSW aware and they will arrange SNF placement. CSW referral for SNF placement. Made attending aware dtr did not want transfer to [facility]. Notified [facility] rep of family's decision. [name] James [name] James phone  [phone]   10/31/14 [name], [name] James  [phone] NCM spoke with patient ,her daughter and her son [name] James, they will come tomorrow to look at [facility], and [name] James with [facility] states they will have a bed for patient tomorrow. NCM notiified CSW and MD of this information and Charge RN.  10/20/14 [name] James, [name] James [phone] per physical therapy rec snf, NCM spoke with patient and she states she has been in [facility] before,  she states she agress to go to a facility for rehab to get stronger before going home.  NCM informed [name] James, the CSW.

## 2014-11-02 ENCOUNTER — Inpatient Hospital Stay
Admission: AD | Admit: 2014-11-02 | Discharge: 2014-12-06 | Disposition: A | Discharge status: Skilled Nursing Facility | Payer: Self-pay | Source: Ambulatory Visit | Attending: Internal Medicine | Admitting: Internal Medicine

## 2014-11-02 DIAGNOSIS — Z6841 Body Mass Index (BMI) 40.0 and over, adult: Secondary | ICD-10-CM | POA: Diagnosis not present

## 2014-11-02 DIAGNOSIS — N183 Chronic kidney disease, stage 3 (moderate): Secondary | ICD-10-CM | POA: Diagnosis not present

## 2014-11-02 DIAGNOSIS — J9601 Acute respiratory failure with hypoxia: Secondary | ICD-10-CM | POA: Diagnosis not present

## 2014-11-02 DIAGNOSIS — J9690 Respiratory failure, unspecified, unspecified whether with hypoxia or hypercapnia: Secondary | ICD-10-CM | POA: Diagnosis not present

## 2014-11-02 DIAGNOSIS — I5033 Acute on chronic diastolic (congestive) heart failure: Secondary | ICD-10-CM | POA: Diagnosis present

## 2014-11-02 DIAGNOSIS — Z452 Encounter for adjustment and management of vascular access device: Secondary | ICD-10-CM

## 2014-11-02 DIAGNOSIS — R278 Other lack of coordination: Secondary | ICD-10-CM | POA: Diagnosis not present

## 2014-11-02 DIAGNOSIS — N185 Chronic kidney disease, stage 5: Secondary | ICD-10-CM | POA: Diagnosis not present

## 2014-11-02 DIAGNOSIS — M6281 Muscle weakness (generalized): Secondary | ICD-10-CM | POA: Diagnosis not present

## 2014-11-02 DIAGNOSIS — I517 Cardiomegaly: Secondary | ICD-10-CM | POA: Diagnosis not present

## 2014-11-02 DIAGNOSIS — R0602 Shortness of breath: Secondary | ICD-10-CM | POA: Diagnosis not present

## 2014-11-02 DIAGNOSIS — E43 Unspecified severe protein-calorie malnutrition: Secondary | ICD-10-CM | POA: Diagnosis not present

## 2014-11-02 DIAGNOSIS — M5431 Sciatica, right side: Secondary | ICD-10-CM | POA: Diagnosis not present

## 2014-11-02 DIAGNOSIS — I509 Heart failure, unspecified: Secondary | ICD-10-CM

## 2014-11-02 DIAGNOSIS — R001 Bradycardia, unspecified: Secondary | ICD-10-CM | POA: Diagnosis not present

## 2014-11-02 DIAGNOSIS — G629 Polyneuropathy, unspecified: Secondary | ICD-10-CM | POA: Diagnosis present

## 2014-11-02 DIAGNOSIS — E668 Other obesity: Secondary | ICD-10-CM | POA: Diagnosis not present

## 2014-11-02 DIAGNOSIS — I480 Paroxysmal atrial fibrillation: Secondary | ICD-10-CM | POA: Diagnosis present

## 2014-11-02 DIAGNOSIS — Z419 Encounter for procedure for purposes other than remedying health state, unspecified: Secondary | ICD-10-CM

## 2014-11-02 DIAGNOSIS — N179 Acute kidney failure, unspecified: Secondary | ICD-10-CM | POA: Diagnosis not present

## 2014-11-02 DIAGNOSIS — G4700 Insomnia, unspecified: Secondary | ICD-10-CM | POA: Diagnosis not present

## 2014-11-02 DIAGNOSIS — F419 Anxiety disorder, unspecified: Secondary | ICD-10-CM | POA: Diagnosis present

## 2014-11-02 DIAGNOSIS — R2681 Unsteadiness on feet: Secondary | ICD-10-CM | POA: Diagnosis not present

## 2014-11-02 DIAGNOSIS — K219 Gastro-esophageal reflux disease without esophagitis: Secondary | ICD-10-CM | POA: Diagnosis not present

## 2014-11-02 DIAGNOSIS — Z95828 Presence of other vascular implants and grafts: Secondary | ICD-10-CM

## 2014-11-02 DIAGNOSIS — J9811 Atelectasis: Secondary | ICD-10-CM | POA: Diagnosis not present

## 2014-11-02 DIAGNOSIS — R531 Weakness: Secondary | ICD-10-CM | POA: Diagnosis not present

## 2014-11-02 DIAGNOSIS — I369 Nonrheumatic tricuspid valve disorder, unspecified: Secondary | ICD-10-CM | POA: Diagnosis not present

## 2014-11-02 DIAGNOSIS — N186 End stage renal disease: Secondary | ICD-10-CM | POA: Diagnosis not present

## 2014-11-02 DIAGNOSIS — E119 Type 2 diabetes mellitus without complications: Secondary | ICD-10-CM | POA: Diagnosis not present

## 2014-11-02 DIAGNOSIS — D638 Anemia in other chronic diseases classified elsewhere: Secondary | ICD-10-CM | POA: Diagnosis present

## 2014-11-02 DIAGNOSIS — G894 Chronic pain syndrome: Secondary | ICD-10-CM | POA: Diagnosis present

## 2014-11-02 DIAGNOSIS — J9600 Acute respiratory failure, unspecified whether with hypoxia or hypercapnia: Secondary | ICD-10-CM | POA: Diagnosis not present

## 2014-11-02 DIAGNOSIS — M1990 Unspecified osteoarthritis, unspecified site: Secondary | ICD-10-CM | POA: Diagnosis not present

## 2014-11-02 DIAGNOSIS — R601 Generalized edema: Secondary | ICD-10-CM | POA: Diagnosis not present

## 2014-11-02 DIAGNOSIS — I10 Essential (primary) hypertension: Secondary | ICD-10-CM | POA: Diagnosis not present

## 2014-11-02 DIAGNOSIS — Z992 Dependence on renal dialysis: Secondary | ICD-10-CM

## 2014-11-02 DIAGNOSIS — J811 Chronic pulmonary edema: Secondary | ICD-10-CM | POA: Diagnosis not present

## 2014-11-02 DIAGNOSIS — D631 Anemia in chronic kidney disease: Secondary | ICD-10-CM | POA: Diagnosis not present

## 2014-11-02 DIAGNOSIS — M545 Low back pain: Secondary | ICD-10-CM | POA: Diagnosis not present

## 2014-11-02 DIAGNOSIS — T68XXXA Hypothermia, initial encounter: Secondary | ICD-10-CM | POA: Diagnosis not present

## 2014-11-02 DIAGNOSIS — D500 Iron deficiency anemia secondary to blood loss (chronic): Secondary | ICD-10-CM | POA: Diagnosis not present

## 2014-11-02 DIAGNOSIS — N19 Unspecified kidney failure: Secondary | ICD-10-CM | POA: Diagnosis not present

## 2014-11-02 DIAGNOSIS — I129 Hypertensive chronic kidney disease with stage 1 through stage 4 chronic kidney disease, or unspecified chronic kidney disease: Secondary | ICD-10-CM | POA: Diagnosis present

## 2014-11-02 DIAGNOSIS — J90 Pleural effusion, not elsewhere classified: Secondary | ICD-10-CM | POA: Diagnosis not present

## 2014-11-02 DIAGNOSIS — K210 Gastro-esophageal reflux disease with esophagitis: Secondary | ICD-10-CM | POA: Diagnosis present

## 2014-11-02 DIAGNOSIS — B9620 Unspecified Escherichia coli [E. coli] as the cause of diseases classified elsewhere: Secondary | ICD-10-CM | POA: Diagnosis present

## 2014-11-02 DIAGNOSIS — D509 Iron deficiency anemia, unspecified: Secondary | ICD-10-CM | POA: Diagnosis not present

## 2014-11-02 DIAGNOSIS — D62 Acute posthemorrhagic anemia: Secondary | ICD-10-CM | POA: Diagnosis not present

## 2014-11-02 DIAGNOSIS — T8249XA Other complication of vascular dialysis catheter, initial encounter: Secondary | ICD-10-CM

## 2014-11-02 DIAGNOSIS — I5030 Unspecified diastolic (congestive) heart failure: Secondary | ICD-10-CM | POA: Diagnosis present

## 2014-11-02 DIAGNOSIS — I501 Left ventricular failure: Secondary | ICD-10-CM | POA: Diagnosis not present

## 2014-11-02 DIAGNOSIS — N2581 Secondary hyperparathyroidism of renal origin: Secondary | ICD-10-CM | POA: Diagnosis not present

## 2014-11-02 DIAGNOSIS — N390 Urinary tract infection, site not specified: Secondary | ICD-10-CM | POA: Diagnosis present

## 2014-11-02 DIAGNOSIS — E1140 Type 2 diabetes mellitus with diabetic neuropathy, unspecified: Secondary | ICD-10-CM | POA: Diagnosis present

## 2014-11-02 DIAGNOSIS — E1165 Type 2 diabetes mellitus with hyperglycemia: Secondary | ICD-10-CM | POA: Diagnosis not present

## 2014-11-02 LAB — GLUCOSE, CAPILLARY
GLUCOSE-CAPILLARY: 170 mg/dL — AB (ref 70–99)
Glucose-Capillary: 224 mg/dL — ABNORMAL HIGH (ref 70–99)

## 2014-11-02 MED ORDER — INSULIN GLARGINE 100 UNIT/ML ~~LOC~~ SOLN: 24.0000 [IU] | Freq: Every day | SUBCUTANEOUS | Status: DC

## 2014-11-02 MED ORDER — ENOXAPARIN SODIUM 30 MG/0.3ML ~~LOC~~ SOLN: 30.0000 mg | SUBCUTANEOUS | Status: DC

## 2014-11-02 MED ORDER — PANTOPRAZOLE SODIUM 40 MG PO TBEC: 40.0000 mg | DELAYED_RELEASE_TABLET | Freq: Every day | ORAL | Status: DC

## 2014-11-02 MED ORDER — POLYETHYLENE GLYCOL 3350 17 G PO PACK: 17.0000 g | PACK | Freq: Every day | ORAL | Status: DC | PRN

## 2014-11-02 MED ORDER — HYDRALAZINE HCL 25 MG PO TABS: 75.0000 mg | ORAL_TABLET | Freq: Three times a day (TID) | ORAL | Status: DC

## 2014-11-02 MED ORDER — FUROSEMIDE 80 MG PO TABS: 160.0000 mg | ORAL_TABLET | Freq: Three times a day (TID) | ORAL | Status: DC

## 2014-11-02 MED ORDER — DIAZEPAM 5 MG PO TABS
5.0000 mg | ORAL_TABLET | Freq: Every day | ORAL | Status: DC | PRN
  Administered 2014-11-02: 5 mg via ORAL
  Filled 2014-11-02: qty 1

## 2014-11-02 MED ORDER — DARBEPOETIN ALFA 100 MCG/0.5ML IJ SOSY: 100.0000 ug | PREFILLED_SYRINGE | INTRAMUSCULAR | Status: DC

## 2014-11-02 MED ORDER — CALCIUM ACETATE 667 MG PO CAPS: 667.0000 mg | ORAL_CAPSULE | Freq: Three times a day (TID) | ORAL | Status: DC

## 2014-11-02 MED ORDER — NEPRO/CARBSTEADY PO LIQD: 237.0000 mL | ORAL | Status: DC

## 2014-11-02 MED ORDER — POTASSIUM CHLORIDE CRYS ER 20 MEQ PO TBCR: 20.0000 meq | EXTENDED_RELEASE_TABLET | Freq: Every day | ORAL | Status: DC

## 2014-11-02 MED ORDER — HYDRALAZINE HCL 20 MG/ML IJ SOLN: 10.0000 mg | Freq: Four times a day (QID) | INTRAMUSCULAR | Status: DC | PRN

## 2014-11-02 MED ORDER — CEPHALEXIN 250 MG PO CAPS: 250.0000 mg | ORAL_CAPSULE | Freq: Two times a day (BID) | ORAL | Status: DC

## 2014-11-02 MED ORDER — INSULIN ASPART 100 UNIT/ML ~~LOC~~ SOLN: 0.0000 [IU] | Freq: Three times a day (TID) | SUBCUTANEOUS | Status: DC

## 2014-11-02 MED ORDER — OXYCODONE HCL 5 MG PO TABS: 2.5000 mg | ORAL_TABLET | ORAL | Status: DC | PRN

## 2014-11-02 MED ORDER — ONDANSETRON HCL 4 MG/2ML IJ SOLN: 4.0000 mg | Freq: Four times a day (QID) | INTRAMUSCULAR | Status: DC | PRN

## 2014-11-02 MED ORDER — INSULIN ASPART 100 UNIT/ML ~~LOC~~ SOLN: 5.0000 [IU] | Freq: Three times a day (TID) | SUBCUTANEOUS | Status: DC

## 2014-11-02 NOTE — Discharge Summary (Addendum)
Physician Discharge Summary  [name] James [id number] DOB: 1936-03-09 DOA: 10/17/2014  PCP: Shirline Frees, MD  Admit date: 10/17/2014 Discharge date: 11/02/2014  Time spent: 35 minutes  Recommendations for Outpatient Follow-up:  1. Trend Cr, hgb 2. Outpatient colonoscopy once breathing improved 3. Continued diuresis with lasix 4.  May need thoracentesis if pleural effusions continue Need further medical management as well as rehab best served at [facility]  Discharge Diagnoses:  Principal Problem:   Acute on chronic diastolic CHF (congestive heart failure) Active Problems:   AKI (acute kidney injury)   HTN (hypertension)   CKD (chronic kidney disease), stage III   DM type 2, uncontrolled, with renal complications   Dyslipidemia   Anemia   Morbid obesity   Edema   Acute encephalopathy   Acute esophagitis   Gastritis and gastroduodenitis   Heme positive stool   Iron deficiency anemia   Physical deconditioning   Discharge Condition: stable  Diet recommendation: carb mod/cardiac  Filed Weights   10/31/14 0500 11/01/14 0519 11/02/14 0500  Weight: 111.9 kg (246 lb 11.1 oz) 112 kg (246 lb 14.6 oz) 109.997 kg (242 lb 8 oz)    History of present illness:  [name] James is a 78 y.o. female with a Past Medical History of chronic anemia disease stage III-4, diabetes, hypertension who presents today with the above noted complaint. Please note, patient is a very poor historian and is not able to clearly give history. Apparently approximately 2-3 weeks back, patient started developing weakness and worsening lower extremity edema. She also describes worsening exertional dyspnea, however is not able to clarify further. She normally is minimally ambulating with the help of a walker. She also claims that 2-3 times, she has had 1-2 loose stools but denies loose frequent watery bowel movements. She claims that yesterday and a few days ago she she had stools as well. She has not had a  bowel movement today. Because of worsening of the above-noted symptoms, she presented to the emergency room for further evaluation and treatment,and was found to have significantly worsening of her renal function, I was asked to admit this patient for further evaluation and treatment. She denies any vomiting, but claims to have nausea. She claims to have significant loss of appetite over the past few weeks as well.  Hospital Course:  Acute on chronic diastolic CHF:  -CXR: BL effusions edema- improving; diuresed >14L since admissions Changed toPO lasix 11/11 - consider thoracentesis if patient continues to have effusions  AKI on CKD IV; creatinine improving with diuresis -nephrology signed off as patient is improving  HTN (hypertension): stable on hydralazine -avoid ACE as renal failure  Type 2 diabetes mellitus with renal complications: not optimally controlled. Adjusting insulin for optimal control  Anemia, AoCD/kidney disease with new UGIB and iron deficiency; Hgb Stable -s/p EGD (11/3): Reflux esophagitis, gastritis, duodenitis; cont PPI;  -Tranfused 1 unit; on aranesp, Iron per GI: If respiratory and functional status improve, consider  colonoscopy. H/h stable  UTI: pansensitive E coli. keflex- end date 11/14 (10 total doses -d/c foley once diuresis has decreased  Morbid obesity  Procedures:    Consultations:  GI   renal  Discharge Exam: Filed Vitals:   11/02/14 0559  BP: 146/57  Pulse:   Temp: 97.6 F (36.4 C)  Resp: 16    General: A+Ox3, NAD Cardiovascular: rrr Respiratory: decreased at left base  Discharge Instructions You were cared for by a hospitalist during your hospital stay. If you have any questions about  your discharge medications or the care you received while you were in the hospital after you are discharged, you can call the unit and asked to speak with the hospitalist on call if the hospitalist that took care of you is not available. Once you  are discharged, your primary care physician will handle any further medical issues. Please note that NO REFILLS for any discharge medications will be authorized once you are discharged, as it is imperative that you return to your primary care physician (or establish a relationship with a primary care physician if you do not have one) for your aftercare needs so that they can reassess your need for medications and monitor your lab values.  Discharge Instructions    Diet - low sodium heart healthy    Complete by:  As directed      Diet Carb Modified    Complete by:  As directed      Increase activity slowly    Complete by:  As directed           Current Discharge Medication List    START taking these medications   Details  calcium acetate (PHOSLO) 667 MG capsule Take 1 capsule (667 mg total) by mouth 3 (three) times daily with meals.    cephALEXin (KEFLEX) 250 MG capsule Take 1 capsule (250 mg total) by mouth every 12 (twelve) hours.    Darbepoetin Alfa (ARANESP) 100 MCG/0.5ML SOSY injection Inject 0.5 mLs (100 mcg total) into the skin every Friday at 6 PM. Qty: 4.2 mL    enoxaparin (LOVENOX) 30 MG/0.3ML injection Inject 0.3 mLs (30 mg total) into the skin daily. Qty: 0 Syringe    hydrALAZINE (APRESOLINE) 20 MG/ML injection Inject 0.5 mLs (10 mg total) into the vein every 6 (six) hours as needed (IF sbp >160 mmHg). Qty: 1 mL    hydrALAZINE (APRESOLINE) 25 MG tablet Take 3 tablets (75 mg total) by mouth every 8 (eight) hours.    !! insulin aspart (NOVOLOG) 100 UNIT/ML injection Inject 0-9 Units into the skin 3 (three) times daily with meals. Qty: 10 mL, Refills: 11    !! insulin aspart (NOVOLOG) 100 UNIT/ML injection Inject 5 Units into the skin 3 (three) times daily with meals. Qty: 10 mL, Refills: 11    insulin glargine (LANTUS) 100 UNIT/ML injection Inject 0.24 mLs (24 Units total) into the skin at bedtime. Qty: 10 mL, Refills: 11    Nutritional Supplements (FEEDING  SUPPLEMENT, NEPRO CARB STEADY,) LIQD Take 237 mLs by mouth daily. Refills: 0    ondansetron (ZOFRAN) 4 MG/2ML SOLN injection Inject 2 mLs (4 mg total) into the vein every 6 (six) hours as needed for nausea. Qty: 2 mL, Refills: 0    oxyCODONE (OXY IR/ROXICODONE) 5 MG immediate release tablet Take 0.5 tablets (2.5 mg total) by mouth every 4 (four) hours as needed for severe pain. Qty: 30 tablet, Refills: 0    pantoprazole (PROTONIX) 40 MG tablet Take 1 tablet (40 mg total) by mouth daily.    polyethylene glycol (MIRALAX / GLYCOLAX) packet Take 17 g by mouth daily as needed for mild constipation. Qty: 14 each, Refills: 0    potassium chloride SA (K-DUR,KLOR-CON) 20 MEQ tablet Take 1 tablet (20 mEq total) by mouth daily.     !! - Potential duplicate medications found. Please discuss with provider.    CONTINUE these medications which have CHANGED   Details  furosemide (LASIX) 80 MG tablet Take 2 tablets (160 mg total) by mouth 3 (three)  times daily.      CONTINUE these medications which have NOT CHANGED   Details  aspirin EC 81 MG tablet Take 81 mg by mouth daily.    atenolol (TENORMIN) 50 MG tablet Take 50 mg by mouth daily.    brimonidine (ALPHAGAN) 0.2 % ophthalmic solution 3 (three) times daily.    diazepam (VALIUM) 5 MG tablet Take 5 mg by mouth every 6 (six) hours as needed for anxiety.     dorzolamide-timolol (COSOPT) 22.3-6.8 MG/ML ophthalmic solution 1 drop 2 (two) times daily.    simvastatin (ZOCOR) 20 MG tablet Take 20 mg by mouth every evening.      STOP taking these medications     allopurinol (ZYLOPRIM) 100 MG tablet      glipiZIDE (GLUCOTROL) 10 MG tablet      insulin NPH-insulin regular (NOVOLIN 70/30) (70-30) 100 UNIT/ML injection      Insulin Syringe-Needle U-100 30G X 1/2" 1 ML MISC      ranitidine (ZANTAC) 150 MG capsule      sitaGLIPtin (JANUVIA) 100 MG tablet        Allergies  Allergen Reactions  . Erythromycin Swelling  . Penicillins     She  has taken keflex many times without porblems  . Codeine Rash      The results of significant diagnostics from this hospitalization (including imaging, microbiology, ancillary and laboratory) are listed below for reference.    Significant Diagnostic Studies: Dg Chest 2 View  10/26/2014   CLINICAL DATA:  Shortness of breath.  EXAM: CHEST  2 VIEW  COMPARISON:  October 23, 2014.  FINDINGS: Stable cardiomediastinal silhouette. Stable bilateral pleural effusions are noted with left greater than right. No pneumothorax is noted. Bony thorax appears intact.  IMPRESSION: Stable bilateral pleural effusions with probable underlying subsegmental atelectasis.   Electronically Signed   By: Sabino Dick M.D.   On: 10/26/2014 08:43   Dg Chest 2 View  10/23/2014   CLINICAL DATA:  Persistent shortness of breath today. History congestive heart failure and atypical chest pain as well as renal insufficiency.  EXAM: CHEST  2 VIEW  COMPARISON:  10/17/2014.  FINDINGS: Hazy opacity at the lung bases obscures hemidiaphragms is due to moderate left and small right pleural effusions with associated dependent lung base opacity, which is likely atelectasis. There is no convincing pulmonary edema or pneumonia.  Cardiac silhouette is top-normal in size. No mediastinal or hilar masses. No pneumothorax.  Bony thorax is demineralized but intact.  IMPRESSION: Moderate left and small right pleural effusions with associated atelectasis. No convincing pneumonia or pulmonary edema.   Electronically Signed   By: Lajean Manes M.D.   On: 10/23/2014 18:23   US Renal  10/17/2014   CLINICAL DATA:  Acute renal failure  EXAM: RENAL/URINARY TRACT ULTRASOUND COMPLETE  COMPARISON:  None.  FINDINGS: Right Kidney:  Length: 12.6 cm. Diffuse cortical thinning is noted. Increased echogenicity is noted as well. There are multiple cysts identified within the right kidney. Largest of these measures 2.2 cm.  Left Kidney:  Length: 11.8 cm. Increased  echogenicity and diffuse cortical thinning. Multiple cysts are identified. The largest of these measures 3.4 cm.  Bladder:  Decompressed by Foley catheter.  IMPRESSION: Increased echogenicity and cortical thinning consistent with medical renal disease.  Bilateral renal cysts.   Electronically Signed   By: Inez Catalina M.D.   On: 10/17/2014 20:42   Dg Chest Port 1 View  10/17/2014   CLINICAL DATA:  Short of breath  EXAM: PORTABLE CHEST - 1 VIEW  COMPARISON:  08/24/2006  FINDINGS: Vascular congestion without pulmonary edema. Left basilar atelectasis. Low volumes. Normal heart size. No pneumothorax.  IMPRESSION: Vascular congestion without pulmonary edema.  Low volumes.   Electronically Signed   By: Maryclare Bean M.D.   On: 10/17/2014 17:31    Microbiology: Recent Results (from the past 240 hour(s))  Culture, Urine     Status: None   Collection Time: 10/27/14 11:50 AM  Result Value Ref Range Status   Specimen Description URINE, CATHETERIZED  Final   Special Requests NONE  Final   Culture  Setup Time   Final    10/27/2014 19:03 Performed at [place]   Final    >=100,000 COLONIES/ML Performed at [place] Performed at [facility]    Report Status 10/30/2014 FINAL  Final   Organism ID, Bacteria ESCHERICHIA COLI  Final      Susceptibility   Escherichia coli - MIC*    AMPICILLIN <=2 SENSITIVE Sensitive     CEFAZOLIN <=4 SENSITIVE Sensitive     CEFTRIAXONE <=1 SENSITIVE Sensitive     CIPROFLOXACIN <=0.25 SENSITIVE Sensitive     GENTAMICIN <=1 SENSITIVE Sensitive     LEVOFLOXACIN <=0.12 SENSITIVE Sensitive     NITROFURANTOIN <=16 SENSITIVE Sensitive     TOBRAMYCIN <=1 SENSITIVE Sensitive     TRIMETH/SULFA <=20 SENSITIVE Sensitive     PIP/TAZO <=4 SENSITIVE Sensitive     * ESCHERICHIA COLI     Labs: Basic Metabolic Panel:  Recent Labs Lab 10/28/14 0318 10/29/14 0614 10/30/14 0515 10/31/14 1013  11/01/14 0050  NA 142 142 144 141 140  K 4.7 5.1 4.6 4.6 4.9  CL 107 106 107 103 103  CO2 20 18* 20 23 23   GLUCOSE 172* 172* 151* 198* 166*  BUN 101* 100* 97* 95* 94*  CREATININE 4.24* 3.71* 3.55* 3.51* 3.48*  CALCIUM 8.1* 8.6 8.8 8.9 8.7  PHOS 7.8* 7.0* 6.5* 5.9*  --    Liver Function Tests:  Recent Labs Lab 10/28/14 0318 10/29/14 0614 10/30/14 0515 10/31/14 1013  ALBUMIN 2.1* 2.5* 2.3* 2.5*   No results for input(s): LIPASE, AMYLASE in the last 168 hours. No results for input(s): AMMONIA in the last 168 hours. CBC:  Recent Labs Lab 10/28/14 0318 10/29/14 0614 10/30/14 0515 10/31/14 1013 11/01/14 0050  WBC 7.8 8.3 7.6 8.0 7.9  HGB 7.4* 9.5* 8.3* 8.4* 8.2*  HCT 23.3* 29.6* 26.3* 26.5* 25.9*  MCV 93.2 94.9 93.9 94.0 93.8  PLT 239 245 280 293 278   Cardiac Enzymes: No results for input(s): CKTOTAL, CKMB, CKMBINDEX, TROPONINI in the last 168 hours. BNP: BNP (last 3 results)  Recent Labs  10/18/14 0727  PROBNP 3262.0*   CBG:  Recent Labs Lab 10/31/14 2229 11/01/14 0803 11/01/14 1239 11/01/14 2234 11/02/14 0803  GLUCAP 177* 150* 228* 196* 170*       Signed:  Abrahan Fulmore  [facility] 11/02/2014, 10:38 AM

## 2014-11-02 NOTE — Progress Notes (Signed)
Inpatient Diabetes Program Recommendations  AACE/ADA: New Consensus Statement on Inpatient Glycemic Control (2013)  Target Ranges:  Prepandial:   less than 140 mg/dL      Peak postprandial:   less than 180 mg/dL (1-2 hours)      Critically ill patients:  140 - 180 mg/dL    Inpatient Diabetes Program Recommendations Insulin - Meal Coverage: Glucose still elevated following meals. Please increase meal coverage novolog again to 7 units tidwc  Thank you, Rosita Kea, RN, CNS, Diabetes Coordinator [phone])

## 2014-11-02 NOTE — Clinical Social Work Note (Signed)
Patient will DC to [facility] today. CSW signing off at this time.    Liz Beach MSW, [place], [place], JI:[id number]

## 2014-11-03 LAB — CBC WITH DIFFERENTIAL/PLATELET
BASOS ABS: 0 10*3/uL (ref 0.0–0.1)
Basophils Relative: 0 % (ref 0–1)
EOS PCT: 4 % (ref 0–5)
Eosinophils Absolute: 0.4 10*3/uL (ref 0.0–0.7)
HCT: 26.9 % — ABNORMAL LOW (ref 36.0–46.0)
Hemoglobin: 8.4 g/dL — ABNORMAL LOW (ref 12.0–15.0)
LYMPHS ABS: 3 10*3/uL (ref 0.7–4.0)
Lymphocytes Relative: 28 % (ref 12–46)
MCH: 29.7 pg (ref 26.0–34.0)
MCHC: 31.2 g/dL (ref 30.0–36.0)
MCV: 95.1 fL (ref 78.0–100.0)
Monocytes Absolute: 1.2 10*3/uL — ABNORMAL HIGH (ref 0.1–1.0)
Monocytes Relative: 11 % (ref 3–12)
NEUTROS ABS: 6.1 10*3/uL (ref 1.7–7.7)
Neutrophils Relative %: 57 % (ref 43–77)
Platelets: 302 10*3/uL (ref 150–400)
RBC: 2.83 MIL/uL — ABNORMAL LOW (ref 3.87–5.11)
RDW: 18.9 % — AB (ref 11.5–15.5)
WBC: 10.6 10*3/uL — ABNORMAL HIGH (ref 4.0–10.5)

## 2014-11-03 LAB — COMPREHENSIVE METABOLIC PANEL
ALT: 74 U/L — AB (ref 0–35)
AST: 82 U/L — AB (ref 0–37)
Albumin: 2.6 g/dL — ABNORMAL LOW (ref 3.5–5.2)
Alkaline Phosphatase: 87 U/L (ref 39–117)
Anion gap: 13 (ref 5–15)
BUN: 100 mg/dL — ABNORMAL HIGH (ref 6–23)
CHLORIDE: 102 meq/L (ref 96–112)
CO2: 26 meq/L (ref 19–32)
Calcium: 9 mg/dL (ref 8.4–10.5)
Creatinine, Ser: 3.67 mg/dL — ABNORMAL HIGH (ref 0.50–1.10)
GFR, EST AFRICAN AMERICAN: 13 mL/min — AB (ref >90)
GFR, EST NON AFRICAN AMERICAN: 11 mL/min — AB (ref >90)
GLUCOSE: 100 mg/dL — AB (ref 70–99)
Potassium: 4.8 mEq/L (ref 3.7–5.3)
SODIUM: 141 meq/L (ref 137–147)
Total Bilirubin: <0.2 mg/dL — ABNORMAL LOW (ref 0.3–1.2)
Total Protein: 5.9 g/dL — ABNORMAL LOW (ref 6.0–8.3)

## 2014-11-03 LAB — MAGNESIUM: Magnesium: 2.1 mg/dL (ref 1.5–2.5)

## 2014-11-03 LAB — PRO B NATRIURETIC PEPTIDE: Pro B Natriuretic peptide (BNP): 1032 pg/mL — ABNORMAL HIGH (ref 0–450)

## 2014-11-03 LAB — PHOSPHORUS: Phosphorus: 6.1 mg/dL — ABNORMAL HIGH (ref 2.3–4.6)

## 2014-11-04 LAB — RENAL FUNCTION PANEL
ANION GAP: 17 — AB (ref 5–15)
Albumin: 2.3 g/dL — ABNORMAL LOW (ref 3.5–5.2)
BUN: 110 mg/dL — AB (ref 6–23)
CHLORIDE: 99 meq/L (ref 96–112)
CO2: 22 mEq/L (ref 19–32)
CREATININE: 3.68 mg/dL — AB (ref 0.50–1.10)
Calcium: 8.8 mg/dL (ref 8.4–10.5)
GFR calc Af Amer: 13 mL/min — ABNORMAL LOW (ref >90)
GFR calc non Af Amer: 11 mL/min — ABNORMAL LOW (ref >90)
GLUCOSE: 198 mg/dL — AB (ref 70–99)
POTASSIUM: 4.8 meq/L (ref 3.7–5.3)
Phosphorus: 6.1 mg/dL — ABNORMAL HIGH (ref 2.3–4.6)
Sodium: 138 mEq/L (ref 137–147)

## 2014-11-05 LAB — RENAL FUNCTION PANEL
ANION GAP: 14 (ref 5–15)
Albumin: 2.3 g/dL — ABNORMAL LOW (ref 3.5–5.2)
BUN: 114 mg/dL — ABNORMAL HIGH (ref 6–23)
CALCIUM: 8.9 mg/dL (ref 8.4–10.5)
CHLORIDE: 98 meq/L (ref 96–112)
CO2: 23 meq/L (ref 19–32)
Creatinine, Ser: 3.86 mg/dL — ABNORMAL HIGH (ref 0.50–1.10)
GFR, EST AFRICAN AMERICAN: 12 mL/min — AB (ref >90)
GFR, EST NON AFRICAN AMERICAN: 10 mL/min — AB (ref >90)
Glucose, Bld: 154 mg/dL — ABNORMAL HIGH (ref 70–99)
POTASSIUM: 4.9 meq/L (ref 3.7–5.3)
Phosphorus: 5.5 mg/dL — ABNORMAL HIGH (ref 2.3–4.6)
SODIUM: 135 meq/L — AB (ref 137–147)

## 2014-11-06 LAB — CBC
HCT: 24.5 % — ABNORMAL LOW (ref 36.0–46.0)
Hemoglobin: 7.7 g/dL — ABNORMAL LOW (ref 12.0–15.0)
MCH: 31.3 pg (ref 26.0–34.0)
MCHC: 31.4 g/dL (ref 30.0–36.0)
MCV: 99.6 fL (ref 78.0–100.0)
PLATELETS: 255 10*3/uL (ref 150–400)
RBC: 2.46 MIL/uL — AB (ref 3.87–5.11)
RDW: 20 % — ABNORMAL HIGH (ref 11.5–15.5)
WBC: 9.4 10*3/uL (ref 4.0–10.5)

## 2014-11-06 LAB — COMPREHENSIVE METABOLIC PANEL
ALT: 65 U/L — AB (ref 0–35)
AST: 85 U/L — AB (ref 0–37)
Albumin: 2.4 g/dL — ABNORMAL LOW (ref 3.5–5.2)
Alkaline Phosphatase: 81 U/L (ref 39–117)
Anion gap: 14 (ref 5–15)
BUN: 122 mg/dL — ABNORMAL HIGH (ref 6–23)
CALCIUM: 9 mg/dL (ref 8.4–10.5)
CO2: 22 meq/L (ref 19–32)
Chloride: 99 mEq/L (ref 96–112)
Creatinine, Ser: 3.64 mg/dL — ABNORMAL HIGH (ref 0.50–1.10)
GFR calc Af Amer: 13 mL/min — ABNORMAL LOW (ref >90)
GFR calc non Af Amer: 11 mL/min — ABNORMAL LOW (ref >90)
Glucose, Bld: 143 mg/dL — ABNORMAL HIGH (ref 70–99)
POTASSIUM: 6 meq/L — AB (ref 3.7–5.3)
SODIUM: 135 meq/L — AB (ref 137–147)
Total Bilirubin: <0.2 mg/dL — ABNORMAL LOW (ref 0.3–1.2)
Total Protein: 6 g/dL (ref 6.0–8.3)

## 2014-11-06 LAB — PHOSPHORUS: PHOSPHORUS: 5.7 mg/dL — AB (ref 2.3–4.6)

## 2014-11-06 LAB — MAGNESIUM: Magnesium: 2.2 mg/dL (ref 1.5–2.5)

## 2014-11-06 LAB — POTASSIUM: Potassium: 5 mEq/L (ref 3.7–5.3)

## 2014-11-07 ENCOUNTER — Other Ambulatory Visit (HOSPITAL_COMMUNITY): Payer: Self-pay

## 2014-11-07 DIAGNOSIS — J9811 Atelectasis: Secondary | ICD-10-CM | POA: Diagnosis not present

## 2014-11-07 DIAGNOSIS — J811 Chronic pulmonary edema: Secondary | ICD-10-CM | POA: Diagnosis not present

## 2014-11-07 DIAGNOSIS — Z452 Encounter for adjustment and management of vascular access device: Secondary | ICD-10-CM | POA: Diagnosis not present

## 2014-11-07 DIAGNOSIS — J90 Pleural effusion, not elsewhere classified: Secondary | ICD-10-CM | POA: Diagnosis not present

## 2014-11-07 LAB — HEMOGLOBIN A1C
Hgb A1c MFr Bld: 6.1 % — ABNORMAL HIGH (ref <5.7)
Mean Plasma Glucose: 128 mg/dL — ABNORMAL HIGH (ref <117)

## 2014-11-07 LAB — RENAL FUNCTION PANEL
ALBUMIN: 2.8 g/dL — AB (ref 3.5–5.2)
Anion gap: 17 — ABNORMAL HIGH (ref 5–15)
BUN: 125 mg/dL — ABNORMAL HIGH (ref 6–23)
CO2: 23 mEq/L (ref 19–32)
Calcium: 9 mg/dL (ref 8.4–10.5)
Chloride: 96 mEq/L (ref 96–112)
Creatinine, Ser: 3.71 mg/dL — ABNORMAL HIGH (ref 0.50–1.10)
GFR calc Af Amer: 13 mL/min — ABNORMAL LOW (ref >90)
GFR calc non Af Amer: 11 mL/min — ABNORMAL LOW (ref >90)
Glucose, Bld: 134 mg/dL — ABNORMAL HIGH (ref 70–99)
POTASSIUM: 4.9 meq/L (ref 3.7–5.3)
Phosphorus: 5.6 mg/dL — ABNORMAL HIGH (ref 2.3–4.6)
Sodium: 136 mEq/L — ABNORMAL LOW (ref 137–147)

## 2014-11-07 LAB — HEMOGLOBIN AND HEMATOCRIT, BLOOD
HCT: 21.6 % — ABNORMAL LOW (ref 36.0–46.0)
Hemoglobin: 6.9 g/dL — CL (ref 12.0–15.0)

## 2014-11-07 LAB — PREPARE RBC (CROSSMATCH)

## 2014-11-08 ENCOUNTER — Other Ambulatory Visit (HOSPITAL_COMMUNITY): Payer: Self-pay

## 2014-11-08 DIAGNOSIS — I509 Heart failure, unspecified: Secondary | ICD-10-CM | POA: Diagnosis not present

## 2014-11-08 DIAGNOSIS — J90 Pleural effusion, not elsewhere classified: Secondary | ICD-10-CM | POA: Diagnosis not present

## 2014-11-08 LAB — RENAL FUNCTION PANEL
Albumin: 3.1 g/dL — ABNORMAL LOW (ref 3.5–5.2)
Anion gap: 16 — ABNORMAL HIGH (ref 5–15)
BUN: 120 mg/dL — ABNORMAL HIGH (ref 6–23)
CHLORIDE: 97 meq/L (ref 96–112)
CO2: 21 meq/L (ref 19–32)
CREATININE: 3.63 mg/dL — AB (ref 0.50–1.10)
Calcium: 9.1 mg/dL (ref 8.4–10.5)
GFR, EST AFRICAN AMERICAN: 13 mL/min — AB (ref >90)
GFR, EST NON AFRICAN AMERICAN: 11 mL/min — AB (ref >90)
Glucose, Bld: 147 mg/dL — ABNORMAL HIGH (ref 70–99)
Phosphorus: 5.7 mg/dL — ABNORMAL HIGH (ref 2.3–4.6)
Potassium: 4.9 mEq/L (ref 3.7–5.3)
SODIUM: 134 meq/L — AB (ref 137–147)

## 2014-11-08 LAB — CBC
HCT: 22.9 % — ABNORMAL LOW (ref 36.0–46.0)
HEMOGLOBIN: 7.3 g/dL — AB (ref 12.0–15.0)
MCH: 30.4 pg (ref 26.0–34.0)
MCHC: 31.9 g/dL (ref 30.0–36.0)
MCV: 95.4 fL (ref 78.0–100.0)
PLATELETS: 172 10*3/uL (ref 150–400)
RBC: 2.4 MIL/uL — AB (ref 3.87–5.11)
RDW: 21 % — ABNORMAL HIGH (ref 11.5–15.5)
WBC: 7.3 10*3/uL (ref 4.0–10.5)

## 2014-11-08 LAB — PRO B NATRIURETIC PEPTIDE: Pro B Natriuretic peptide (BNP): 1198 pg/mL — ABNORMAL HIGH (ref 0–450)

## 2014-11-08 LAB — HEPATIC FUNCTION PANEL
ALT: 47 U/L — ABNORMAL HIGH (ref 0–35)
AST: 45 U/L — AB (ref 0–37)
Albumin: 3.2 g/dL — ABNORMAL LOW (ref 3.5–5.2)
Alkaline Phosphatase: 84 U/L (ref 39–117)
Bilirubin, Direct: <0.2 mg/dL (ref 0.0–0.3)
TOTAL PROTEIN: 5.8 g/dL — AB (ref 6.0–8.3)
Total Bilirubin: <0.2 mg/dL — ABNORMAL LOW (ref 0.3–1.2)

## 2014-11-09 ENCOUNTER — Other Ambulatory Visit (HOSPITAL_COMMUNITY): Payer: Self-pay

## 2014-11-09 DIAGNOSIS — I501 Left ventricular failure: Secondary | ICD-10-CM | POA: Diagnosis not present

## 2014-11-09 LAB — CBC
HEMATOCRIT: 22.9 % — AB (ref 36.0–46.0)
Hemoglobin: 7.1 g/dL — ABNORMAL LOW (ref 12.0–15.0)
MCH: 30 pg (ref 26.0–34.0)
MCHC: 31 g/dL (ref 30.0–36.0)
MCV: 96.6 fL (ref 78.0–100.0)
Platelets: 174 10*3/uL (ref 150–400)
RBC: 2.37 MIL/uL — AB (ref 3.87–5.11)
RDW: 20.8 % — ABNORMAL HIGH (ref 11.5–15.5)
WBC: 8.5 10*3/uL (ref 4.0–10.5)

## 2014-11-09 LAB — RENAL FUNCTION PANEL
ALBUMIN: 3.6 g/dL (ref 3.5–5.2)
ANION GAP: 16 — AB (ref 5–15)
BUN: 129 mg/dL — ABNORMAL HIGH (ref 6–23)
CO2: 25 mEq/L (ref 19–32)
CREATININE: 3.46 mg/dL — AB (ref 0.50–1.10)
Calcium: 9.3 mg/dL (ref 8.4–10.5)
Chloride: 101 mEq/L (ref 96–112)
GFR calc Af Amer: 14 mL/min — ABNORMAL LOW (ref >90)
GFR, EST NON AFRICAN AMERICAN: 12 mL/min — AB (ref >90)
Glucose, Bld: 140 mg/dL — ABNORMAL HIGH (ref 70–99)
Phosphorus: 5 mg/dL — ABNORMAL HIGH (ref 2.3–4.6)
Potassium: 5 mEq/L (ref 3.7–5.3)
SODIUM: 142 meq/L (ref 137–147)

## 2014-11-09 LAB — BASIC METABOLIC PANEL
Anion gap: 16 — ABNORMAL HIGH (ref 5–15)
BUN: 128 mg/dL — ABNORMAL HIGH (ref 6–23)
CHLORIDE: 101 meq/L (ref 96–112)
CO2: 25 meq/L (ref 19–32)
CREATININE: 3.4 mg/dL — AB (ref 0.50–1.10)
Calcium: 9.3 mg/dL (ref 8.4–10.5)
GFR calc Af Amer: 14 mL/min — ABNORMAL LOW (ref >90)
GFR calc non Af Amer: 12 mL/min — ABNORMAL LOW (ref >90)
GLUCOSE: 139 mg/dL — AB (ref 70–99)
POTASSIUM: 5 meq/L (ref 3.7–5.3)
Sodium: 142 mEq/L (ref 137–147)

## 2014-11-10 ENCOUNTER — Other Ambulatory Visit (HOSPITAL_COMMUNITY): Payer: Self-pay

## 2014-11-10 DIAGNOSIS — J90 Pleural effusion, not elsewhere classified: Secondary | ICD-10-CM | POA: Diagnosis not present

## 2014-11-10 DIAGNOSIS — I517 Cardiomegaly: Secondary | ICD-10-CM | POA: Diagnosis not present

## 2014-11-10 DIAGNOSIS — I501 Left ventricular failure: Secondary | ICD-10-CM | POA: Diagnosis not present

## 2014-11-10 DIAGNOSIS — J811 Chronic pulmonary edema: Secondary | ICD-10-CM | POA: Diagnosis present

## 2014-11-10 DIAGNOSIS — I5033 Acute on chronic diastolic (congestive) heart failure: Secondary | ICD-10-CM

## 2014-11-10 DIAGNOSIS — I369 Nonrheumatic tricuspid valve disorder, unspecified: Secondary | ICD-10-CM

## 2014-11-10 LAB — BASIC METABOLIC PANEL
Anion gap: 15 (ref 5–15)
BUN: 136 mg/dL — ABNORMAL HIGH (ref 6–23)
CALCIUM: 9.7 mg/dL (ref 8.4–10.5)
CO2: 25 mEq/L (ref 19–32)
CREATININE: 3.41 mg/dL — AB (ref 0.50–1.10)
Chloride: 102 mEq/L (ref 96–112)
GFR calc Af Amer: 14 mL/min — ABNORMAL LOW (ref >90)
GFR, EST NON AFRICAN AMERICAN: 12 mL/min — AB (ref >90)
GLUCOSE: 178 mg/dL — AB (ref 70–99)
Potassium: 5.7 mEq/L — ABNORMAL HIGH (ref 3.7–5.3)
SODIUM: 142 meq/L (ref 137–147)

## 2014-11-10 LAB — CBC WITH DIFFERENTIAL/PLATELET
Basophils Absolute: 0 10*3/uL (ref 0.0–0.1)
Basophils Relative: 0 % (ref 0–1)
EOS ABS: 0.1 10*3/uL (ref 0.0–0.7)
Eosinophils Relative: 1 % (ref 0–5)
HCT: 28.1 % — ABNORMAL LOW (ref 36.0–46.0)
Hemoglobin: 8.5 g/dL — ABNORMAL LOW (ref 12.0–15.0)
LYMPHS PCT: 21 % (ref 12–46)
Lymphs Abs: 2.1 10*3/uL (ref 0.7–4.0)
MCH: 29.6 pg (ref 26.0–34.0)
MCHC: 30.2 g/dL (ref 30.0–36.0)
MCV: 97.9 fL (ref 78.0–100.0)
Monocytes Absolute: 0.9 10*3/uL (ref 0.1–1.0)
Monocytes Relative: 9 % (ref 3–12)
Neutro Abs: 7.1 10*3/uL (ref 1.7–7.7)
Neutrophils Relative %: 69 % (ref 43–77)
PLATELETS: 172 10*3/uL (ref 150–400)
RBC: 2.87 MIL/uL — ABNORMAL LOW (ref 3.87–5.11)
RDW: 20.6 % — AB (ref 11.5–15.5)
WBC: 10.3 10*3/uL (ref 4.0–10.5)

## 2014-11-10 LAB — TYPE AND SCREEN
ABO/RH(D): O POS
Antibody Screen: NEGATIVE
UNIT DIVISION: 0
Unit division: 0

## 2014-11-10 LAB — MAGNESIUM: MAGNESIUM: 2.5 mg/dL (ref 1.5–2.5)

## 2014-11-10 LAB — PHOSPHORUS: Phosphorus: 5.7 mg/dL — ABNORMAL HIGH (ref 2.3–4.6)

## 2014-11-10 NOTE — Consult Note (Signed)
CARDIOLOGY CONSULT NOTE   Patient ID: [name] James MRN: [id number] DOB/AGE: 28-Jun-1936 78 y.o.  Admit Date: 11/02/2014  Primary Physician: Shirline Frees, MD  Primary Cardiologist   Not clear who she has seen before   Clinical Summary [name] James is a 78 y.o.female. [facility] She was transferred here from the main hospital. She had been an inpatient from October 17, 2014 to November 02, 2014. During that time she had acute on chronic diastolic CHF with acute kidney injury. There were almost to the other issues. She had been admitted with weakness and worsening edema. She had bilateral pleural effusions. She diuresed greater than 14 L. It is noted that it was felt that she might need recurrent thoracenteses over time. Her creatinine was greater than 4 at one point. She was followed by the nephrology team who ultimately signed off when it appeared that she was relatively stable. Ultimately her creatinine was in the range of 3.7 when she was transferred to the specialty Hospital. She had been transfused one unit for anemia..  Since being in the [facility] she has been followed by a different nephrology team. She has continued to have worsening shortness of breath. She is not in uric. However despite large doses of IV diuretics, her volume status cannot be controlled. Chest x-ray shows worsening pulmonary edema. Two-dimensional echo had been done on October 18, 2014. It was repeated on November 10, 2014. On both occasions she has normal systolic left ventricular function. There are no significant valvular abnormalities. There is mild right ventricular dysfunction. Pulmonary artery pressure appears to be in the range of 50 mmHg.  The current plan is to proceed with dialysis possibly as soon as November 23. Cardiology is consulted to see if we have any other suggestions concerning the approach to her care.   Allergies  Allergen Reactions  .  Erythromycin Swelling  . Penicillins     She has taken keflex many times without porblems  . Codeine Rash    Medications Scheduled Medications:    Infusions:    PRN Medications:        Past Medical History  Diagnosis Date  . CHF (congestive heart failure) 05/13/2011    echo - [id number]; stage 1 diastolic dysfunction; elevated LV filling pressure, MAC  . Chest pain, atypical 12/05/2008    R/Lmv- normal perfusion all regions, noe ECG changes   . Renal insufficiency 10/30/2005    doppler - abn resistance consistent w/ parenchymal disease  . Claudication 10/30/2005    doppler - normal evaluation, no evidence of aneurysm, diameter reduction, dissection, compression or vascular abnormality)  . DM (diabetes mellitus)   . Peripheral neuropathy   . Hypertension   . Edema   . Gout   . Anxiety   . Arthritis   . Cataract   . GERD (gastroesophageal reflux disease)   . Breast cancer 1980s    bil mastectomies, no radiation or chemo    Past Surgical History  Procedure Laterality Date  . Mastectomy Bilateral ~1982    bil breast reconstruction with implants  . Back surgery  ~1980  . Hemorrhoid surgery    . Cholecystectomy    . Eye surgery    . Orif tibia & fibula fractures Left 2007    also had left non displaced malleolar fracture.   . Esophagogastroduodenoscopy N/A 10/24/2014    Procedure: ESOPHAGOGASTRODUODENOSCOPY (EGD);  Surgeon: Jerene Bears, MD;  Location: [facility] ENDOSCOPY;  Service: Endoscopy;  Laterality: N/A;    Family History  Problem Relation Age of Onset  . Cancer Mother   . Diabetes Mother   . Heart disease Mother   . Heart attack Father   . Stroke Father   . Parkinson's disease Brother   . Cancer Sister     Social History [name] James reports that she has never smoked. She does not have any smokeless tobacco history on file. [name] James reports that she does not drink alcohol.  Review of Systems I have attempted to take a full review of systems. The patient  is weak and poorly responsive. Currently she is not complaining of chest pain nausea or vomiting. She does have some shortness of breath.  Physical Examination There were no vitals taken for this visit. No intake or output data in the 24 hours ending 11/10/14 1953  The patient's grandson is in the room. I have talked to him several times during the evaluation. He is relaying information to the rest of his family. The patient is weak and poorly responsive. Lung exam reveals scattered rhonchi on arrival. Cardiac exam reveals an S1 and S2. Head is atraumatic. Sclera and conjunctiva are normal. Her abdomen is soft. She has a bloated appearance. However I do not feel any significant pitting edema in her extremities or her abdomen. Neurologic evaluation is very limited. There are no significant skin rashes. There are no significant musculoskeletal abnormalities.  Prior Cardiac Testing/Procedures  Lab Results  Basic Metabolic Panel:  Recent Labs Lab 11/06/14 0755  11/07/14 0710 11/08/14 0714 11/09/14 0500 11/10/14 0700  NA 135*  --  136* 134* 142  142 142  K 6.0*  < > 4.9 4.9 5.0  5.0 5.7*  CL 99  --  96 97 101  101 102  CO2 22  --  23 21 25  25 25   GLUCOSE 143*  --  134* 147* 140*  139* 178*  BUN 122*  --  125* 120* 129*  128* 136*  CREATININE 3.64*  --  3.71* 3.63* 3.46*  3.40* 3.41*  CALCIUM 9.0  --  9.0 9.1 9.3  9.3 9.7  MG 2.2  --   --   --   --  2.5  PHOS 5.7*  --  5.6* 5.7* 5.0* 5.7*  < > = values in this interval not displayed.  Liver Function Tests:  Recent Labs Lab 11/05/14 0301 11/06/14 0755 11/07/14 0710 11/08/14 0714 11/09/14 0500  AST  --  85*  --  45*  --   ALT  --  65*  --  47*  --   ALKPHOS  --  81  --  84  --   BILITOT  --  <0.2*  --  <0.2*  --   PROT  --  6.0  --  5.8*  --   ALBUMIN 2.3* 2.4* 2.8* 3.1*  3.2* 3.6    CBC:  Recent Labs Lab 11/06/14 0755 11/07/14 0710 11/08/14 0714 11/09/14 0500 11/10/14 0700  WBC 9.4  --  7.3 8.5 10.3    NEUTROABS  --   --   --   --  7.1  HGB 7.7* 6.9* 7.3* 7.1* 8.5*  HCT 24.5* 21.6* 22.9* 22.9* 28.1*  MCV 99.6  --  95.4 96.6 97.9  PLT 255  --  172 174 172    Cardiac Enzymes: No results for input(s): CKTOTAL, CKMB, CKMBINDEX, TROPONINI in the last 168 hours.  BNP: Invalid input(s): POCBNP   Radiology: Dg  Chest Port 1 View  11/10/2014   CLINICAL DATA:  Subsequent encounter for congestive failure and pleural effusion.  EXAM: PORTABLE CHEST - 1 VIEW  COMPARISON:  11/09/2014  FINDINGS: 0645 hrs. Stable lung volumes with interval progression of pulmonary edema. Bibasilar atelectasis with small bilateral pleural effusions again noted. The cardio pericardial silhouette is enlarged. Right IJ central line tip overlies the distal SVC level. Imaged bony structures of the thorax are intact.  IMPRESSION: Cardiomegaly with slight interval progression of pulmonary edema.  Persistent bilateral pleural effusions.   Electronically Signed   By: Misty Stanley M.D.   On: 11/10/2014 08:27   Dg Chest Port 1 View  11/09/2014   CLINICAL DATA:  Congestive heart failure.  EXAM: PORTABLE CHEST - 1 VIEW  COMPARISON:  November 08, 2014.  FINDINGS: Stable cardiomegaly. Right internal jugular catheter line is unchanged with distal tip overlying expected position of the SVC. Slightly increased bilateral perihilar and basilar opacities are noted, consistent with edema and associated pleural effusions. Left is greater than right. No pneumothorax is noted.  IMPRESSION: Slightly increased bilateral perihilar and basilar edema is noted with associated pleural effusions consistent with congestive heart failure.   Electronically Signed   By: Sabino Dick M.D.   On: 11/09/2014 12:52     ECG:  I have reviewed the EKG from October 17, 2014. There are mild nonspecific ST changes. There is normal sinus rhythm.   Impression and Recommendations    AKI (acute kidney injury)     The patient has ongoing non-oliguric renal failure.  Her creatinine appears stable. However her BUN continues to climb. I agree that it is reasonable to proceed with dialysis to see if the patient's overall volume status and renal status can be improved. I cannot make any recommendations concerning her cardiac treatment that will have a significant effect at this time.    Acute on chronic diastolic CHF (congestive heart failure)      The patient has good left ventricular systolic function I echo today. Her heart failure is called diastolic because she has normal systolic function. However the major basis of her heart failure is the inability of her kidneys to excrete the proper amount of urine. There are no changes in the approach to her cardiac care that will help with this situation at this time.    Pulmonary edema     Chest x-ray shows continued worsening fluid in her lungs. Proceeding with dialysis will hopefully help with this situation.    Daryel November, MD As part of this evaluation I spent greater than 90 minutes trying to review her records and understand her overall status. 11/10/2014, 7:53 PM

## 2014-11-10 NOTE — Progress Notes (Signed)
  Echocardiogram 2D Echocardiogram has been performed.  Darlina Sicilian M 11/10/2014, 2:43 PM

## 2014-11-11 ENCOUNTER — Other Ambulatory Visit (HOSPITAL_COMMUNITY): Payer: Self-pay

## 2014-11-11 DIAGNOSIS — I509 Heart failure, unspecified: Secondary | ICD-10-CM | POA: Diagnosis not present

## 2014-11-11 DIAGNOSIS — I517 Cardiomegaly: Secondary | ICD-10-CM | POA: Diagnosis not present

## 2014-11-11 DIAGNOSIS — J90 Pleural effusion, not elsewhere classified: Secondary | ICD-10-CM | POA: Diagnosis not present

## 2014-11-11 DIAGNOSIS — I10 Essential (primary) hypertension: Secondary | ICD-10-CM | POA: Diagnosis not present

## 2014-11-11 LAB — CBC
HEMATOCRIT: 26 % — AB (ref 36.0–46.0)
Hemoglobin: 7.8 g/dL — ABNORMAL LOW (ref 12.0–15.0)
MCH: 29.9 pg (ref 26.0–34.0)
MCHC: 30 g/dL (ref 30.0–36.0)
MCV: 99.6 fL (ref 78.0–100.0)
Platelets: 148 10*3/uL — ABNORMAL LOW (ref 150–400)
RBC: 2.61 MIL/uL — ABNORMAL LOW (ref 3.87–5.11)
RDW: 20.3 % — AB (ref 11.5–15.5)
WBC: 9.4 10*3/uL (ref 4.0–10.5)

## 2014-11-11 LAB — BASIC METABOLIC PANEL
Anion gap: 15 (ref 5–15)
BUN: 146 mg/dL — AB (ref 6–23)
CALCIUM: 9.2 mg/dL (ref 8.4–10.5)
CO2: 25 meq/L (ref 19–32)
CREATININE: 3.68 mg/dL — AB (ref 0.50–1.10)
Chloride: 102 mEq/L (ref 96–112)
GFR calc Af Amer: 13 mL/min — ABNORMAL LOW (ref >90)
GFR calc non Af Amer: 11 mL/min — ABNORMAL LOW (ref >90)
GLUCOSE: 172 mg/dL — AB (ref 70–99)
Potassium: 5.5 mEq/L — ABNORMAL HIGH (ref 3.7–5.3)
SODIUM: 142 meq/L (ref 137–147)

## 2014-11-12 ENCOUNTER — Other Ambulatory Visit (HOSPITAL_COMMUNITY): Payer: Self-pay

## 2014-11-12 DIAGNOSIS — N19 Unspecified kidney failure: Secondary | ICD-10-CM | POA: Diagnosis not present

## 2014-11-12 DIAGNOSIS — J90 Pleural effusion, not elsewhere classified: Secondary | ICD-10-CM | POA: Diagnosis not present

## 2014-11-12 DIAGNOSIS — I509 Heart failure, unspecified: Secondary | ICD-10-CM | POA: Diagnosis not present

## 2014-11-12 LAB — CBC WITH DIFFERENTIAL/PLATELET
Basophils Absolute: 0 10*3/uL (ref 0.0–0.1)
Basophils Relative: 0 % (ref 0–1)
EOS ABS: 0 10*3/uL (ref 0.0–0.7)
Eosinophils Relative: 0 % (ref 0–5)
HCT: 24.4 % — ABNORMAL LOW (ref 36.0–46.0)
HEMOGLOBIN: 7.3 g/dL — AB (ref 12.0–15.0)
Lymphocytes Relative: 15 % (ref 12–46)
Lymphs Abs: 1.4 10*3/uL (ref 0.7–4.0)
MCH: 30.2 pg (ref 26.0–34.0)
MCHC: 29.9 g/dL — AB (ref 30.0–36.0)
MCV: 100.8 fL — ABNORMAL HIGH (ref 78.0–100.0)
MONOS PCT: 5 % (ref 3–12)
Monocytes Absolute: 0.5 10*3/uL (ref 0.1–1.0)
NEUTROS PCT: 80 % — AB (ref 43–77)
Neutro Abs: 7.4 10*3/uL (ref 1.7–7.7)
Platelets: 152 10*3/uL (ref 150–400)
RBC: 2.42 MIL/uL — ABNORMAL LOW (ref 3.87–5.11)
RDW: 20 % — AB (ref 11.5–15.5)
WBC: 9.3 10*3/uL (ref 4.0–10.5)

## 2014-11-12 LAB — BLOOD GAS, ARTERIAL
Acid-base deficit: 2.4 mmol/L — ABNORMAL HIGH (ref 0.0–2.0)
Acid-base deficit: 2.2 mmol/L — ABNORMAL HIGH (ref 0.0–2.0)
Acid-base deficit: 1.9 mmol/L (ref 0.0–2.0)
BICARBONATE: 25.8 meq/L — AB (ref 20.0–24.0)
BICARBONATE: 24.5 meq/L — AB (ref 20.0–24.0)
Bicarbonate: 25.3 mEq/L — ABNORMAL HIGH (ref 20.0–24.0)
Delivery systems: POSITIVE
Delivery systems: POSITIVE
Expiratory PAP: 8
Expiratory PAP: 8
FIO2: 0.3 %
FIO2: 0.3 %
Inspiratory PAP: 16
Inspiratory PAP: 16
O2 Content: 3 L/min
O2 SAT: 98.2 %
O2 Saturation: 98.4 %
O2 Saturation: 98.7 %
PATIENT TEMPERATURE: 91
PATIENT TEMPERATURE: 94
PCO2 ART: 59.5 mmHg — AB (ref 35.0–45.0)
PH ART: 7.224 — AB (ref 7.350–7.450)
PH ART: 7.275 — AB (ref 7.350–7.450)
PO2 ART: 81.2 mmHg (ref 80.0–100.0)
PO2 ART: 96.9 mmHg (ref 80.0–100.0)
Patient temperature: 91.7
TCO2: 28.3 mmol/L (ref 0–100)
TCO2: 27.5 mmol/L (ref 0–100)
TCO2: 26.4 mmol/L (ref 0–100)
pCO2 arterial: 65.8 mmHg (ref 35.0–45.0)
pCO2 arterial: 52.4 mmHg — ABNORMAL HIGH (ref 35.0–45.0)
pH, Arterial: 7.186 — CL (ref 7.350–7.450)
pO2, Arterial: 86.9 mmHg (ref 80.0–100.0)

## 2014-11-12 LAB — BASIC METABOLIC PANEL
Anion gap: 15 (ref 5–15)
BUN: 161 mg/dL — AB (ref 6–23)
CO2: 26 mEq/L (ref 19–32)
Calcium: 9.1 mg/dL (ref 8.4–10.5)
Chloride: 101 mEq/L (ref 96–112)
Creatinine, Ser: 4.18 mg/dL — ABNORMAL HIGH (ref 0.50–1.10)
GFR calc Af Amer: 11 mL/min — ABNORMAL LOW (ref >90)
GFR, EST NON AFRICAN AMERICAN: 9 mL/min — AB (ref >90)
Glucose, Bld: 236 mg/dL — ABNORMAL HIGH (ref 70–99)
POTASSIUM: 5.5 meq/L — AB (ref 3.7–5.3)
Sodium: 142 mEq/L (ref 137–147)

## 2014-11-12 LAB — HEPATITIS B SURFACE ANTIBODY,QUALITATIVE: HEP B S AB: NEGATIVE

## 2014-11-12 LAB — PREPARE RBC (CROSSMATCH)

## 2014-11-12 LAB — HEPATITIS B SURFACE ANTIGEN: Hepatitis B Surface Ag: NEGATIVE

## 2014-11-12 LAB — TSH: TSH: 0.785 u[IU]/mL (ref 0.350–4.500)

## 2014-11-12 MED ORDER — LIDOCAINE HCL 1 % IJ SOLN
INTRAMUSCULAR | Status: AC
  Filled 2014-11-12: qty 20

## 2014-11-12 MED ORDER — HEPARIN SODIUM (PORCINE) 1000 UNIT/ML IJ SOLN
INTRAMUSCULAR | Status: AC
  Filled 2014-11-12: qty 1

## 2014-11-12 NOTE — Procedures (Signed)
  Procedure:  Left IJ non-tunneled HD catheter placement Access:  Left IJ vein Findings:  Given acuity of condition and need for emergent HD, temp cath placed today. 13 Fr Trialysis catheter placed with tip in RA. No PTX. OK to use.

## 2014-11-13 ENCOUNTER — Other Ambulatory Visit (HOSPITAL_COMMUNITY): Payer: Self-pay

## 2014-11-13 DIAGNOSIS — N179 Acute kidney failure, unspecified: Secondary | ICD-10-CM

## 2014-11-13 DIAGNOSIS — J90 Pleural effusion, not elsewhere classified: Secondary | ICD-10-CM | POA: Diagnosis not present

## 2014-11-13 DIAGNOSIS — I509 Heart failure, unspecified: Secondary | ICD-10-CM | POA: Diagnosis not present

## 2014-11-13 LAB — COMPREHENSIVE METABOLIC PANEL
ALT: 74 U/L — ABNORMAL HIGH (ref 0–35)
ANION GAP: 20 — AB (ref 5–15)
AST: 47 U/L — ABNORMAL HIGH (ref 0–37)
Albumin: 3 g/dL — ABNORMAL LOW (ref 3.5–5.2)
Alkaline Phosphatase: 102 U/L (ref 39–117)
BILIRUBIN TOTAL: 0.3 mg/dL (ref 0.3–1.2)
BUN: 131 mg/dL — AB (ref 6–23)
CHLORIDE: 101 meq/L (ref 96–112)
CO2: 21 meq/L (ref 19–32)
Calcium: 8.6 mg/dL (ref 8.4–10.5)
Creatinine, Ser: 3.73 mg/dL — ABNORMAL HIGH (ref 0.50–1.10)
GFR, EST AFRICAN AMERICAN: 13 mL/min — AB (ref >90)
GFR, EST NON AFRICAN AMERICAN: 11 mL/min — AB (ref >90)
GLUCOSE: 215 mg/dL — AB (ref 70–99)
Potassium: 4.5 mEq/L (ref 3.7–5.3)
SODIUM: 142 meq/L (ref 137–147)
Total Protein: 5.8 g/dL — ABNORMAL LOW (ref 6.0–8.3)

## 2014-11-13 LAB — BLOOD GAS, ARTERIAL
Acid-base deficit: 3.9 mmol/L — ABNORMAL HIGH (ref 0.0–2.0)
BICARBONATE: 22.2 meq/L (ref 20.0–24.0)
Delivery systems: POSITIVE
Expiratory PAP: 8
FIO2: 0.3 %
Inspiratory PAP: 16
LHR: 16 {breaths}/min
MODE: POSITIVE
O2 Saturation: 97.6 %
PCO2 ART: 51.4 mmHg — AB (ref 35.0–45.0)
PH ART: 7.257 — AB (ref 7.350–7.450)
Patient temperature: 98.5
TCO2: 23.7 mmol/L (ref 0–100)
pO2, Arterial: 97.7 mmHg (ref 80.0–100.0)

## 2014-11-13 LAB — CBC WITH DIFFERENTIAL/PLATELET
Basophils Absolute: 0 10*3/uL (ref 0.0–0.1)
Basophils Relative: 0 % (ref 0–1)
EOS ABS: 0 10*3/uL (ref 0.0–0.7)
Eosinophils Relative: 0 % (ref 0–5)
HCT: 26.2 % — ABNORMAL LOW (ref 36.0–46.0)
HEMOGLOBIN: 8.3 g/dL — AB (ref 12.0–15.0)
LYMPHS ABS: 1.5 10*3/uL (ref 0.7–4.0)
LYMPHS PCT: 17 % (ref 12–46)
MCH: 31.2 pg (ref 26.0–34.0)
MCHC: 31.7 g/dL (ref 30.0–36.0)
MCV: 98.5 fL (ref 78.0–100.0)
MONOS PCT: 8 % (ref 3–12)
Monocytes Absolute: 0.7 10*3/uL (ref 0.1–1.0)
NEUTROS ABS: 7 10*3/uL (ref 1.7–7.7)
NEUTROS PCT: 75 % (ref 43–77)
PLATELETS: 158 10*3/uL (ref 150–400)
RBC: 2.66 MIL/uL — AB (ref 3.87–5.11)
RDW: 19.9 % — ABNORMAL HIGH (ref 11.5–15.5)
WBC: 9.3 10*3/uL (ref 4.0–10.5)

## 2014-11-13 LAB — HEPATITIS B SURFACE ANTIBODY,QUALITATIVE: Hep B S Ab: NEGATIVE

## 2014-11-13 LAB — MAGNESIUM: Magnesium: 2.3 mg/dL (ref 1.5–2.5)

## 2014-11-13 LAB — PARATHYROID HORMONE, INTACT (NO CA): PTH: 114 pg/mL — ABNORMAL HIGH (ref 14–64)

## 2014-11-13 LAB — PHOSPHORUS: Phosphorus: 5.6 mg/dL — ABNORMAL HIGH (ref 2.3–4.6)

## 2014-11-13 LAB — OCCULT BLOOD X 1 CARD TO LAB, STOOL: FECAL OCCULT BLD: POSITIVE — AB

## 2014-11-13 LAB — PRO B NATRIURETIC PEPTIDE: Pro B Natriuretic peptide (BNP): 4493 pg/mL — ABNORMAL HIGH (ref 0–450)

## 2014-11-13 LAB — HEPATITIS B SURFACE ANTIGEN: HEP B S AG: NEGATIVE

## 2014-11-13 NOTE — Progress Notes (Signed)
Patient: [name] James / Admit Date: 11/02/2014 / Date of Encounter: 11/13/2014, 12:52 PM   Subjective: Did not sleep at all last night due to [place]. Denies CP or dyspnea. No symptoms at time of AF.   Objective: Telemetry: NSR, run of atrial fib RVR last night with HR 130s Physical Exam: T 96.8, P 71, BP 132/78, Pox 96% Richards General: Chronically ill obese WF, in no acute distress. Head: Normocephalic, atraumatic, sclera non-icteric, no xanthomas, nares are without discharge. Neck: JVP difficult to assess given habitus. Lungs: Coarse BS throughout. Mildly increased WOB Heart: Distant heart sounds, RRR S1 S2 without murmurs, rubs, or gallops.  Abdomen: Soft, non-tender, non-distended with normoactive bowel sounds. No rebound/guarding. Extremities: No clubbing or cyanosis. Large leg habitus but no pitting edema. Neuro: Somewhat confused, thinks she's in [place], knows she's in the hospital, says her daughter is her mom.  No intake or output data in the 24 hours ending 11/13/14 1252  Inpatient Medications:  Novolog Aranesp Aspirin 81mg  daily Atenolol 25mg  daily Budesonide Calcium acetate 667mg  TID Diazepam qhs Dorzolamide/timolol eye drops Feso4 324mg  daily Levemir Lovenox 30mg  Melatonin Nephrovite Protonix 40mg  IV BID Senna  Infusions:   Labs:  Recent Labs  11/12/14 0500 11/13/14 0500  NA 142 142  K 5.5* 4.5  CL 101 101  CO2 26 21  GLUCOSE 236* 215*  BUN 161* 131*  CREATININE 4.18* 3.73*  CALCIUM 9.1 8.6  MG  --  2.3  PHOS  --  5.6*    Recent Labs  11/13/14 0500  AST 47*  ALT 74*  ALKPHOS 102  BILITOT 0.3  PROT 5.8*  ALBUMIN 3.0*    Recent Labs  11/12/14 0500 11/13/14 0500  WBC 9.3 9.3  NEUTROABS 7.4 7.0  HGB 7.3* 8.3*  HCT 24.4* 26.2*  MCV 100.8* 98.5  PLT 152 158   No results for input(s): CKTOTAL, CKMB, TROPONINI in the last 72 hours. Invalid input(s): POCBNP No results for input(s): HGBA1C in the last 72 hours.    Radiology/Studies:  See epic    Assessment and Plan  1. Volume overload in the setting of renal failure progressing to requiring HD 2. Acute on chronic diastolic CHF 3. Anemia with UGI bleed s/p EGD (11/3): Reflux esophagitis, gastritis, duodenitis +FOBT, requiring transfusions 4. Acute respiratory failure 5. E Coli UTI 6. PAF with possible component of tachybrady syndrome (AF on tele, also SB yesterday) - newly recognized 7. Hypertension 8. Morbid obesity BMI 42 9. Elevated LFTs 10. Sinus bradycardia (EKG yesterday - 46bpm)  At this point in time, volume is managed by hemodialysis. Per nephrology. Further treatment for diastolic CHF would be continued control of her blood pressure.   She was asymptomatic during her runs of rapid atrial fibrillation and during bradycardia on 11/22 with HR 46 on EKG. There may be a component of tachy-brady here. Fortunately she does not appear to have been symptomatic with her atrial fibrillation. This is likely exacerbated by her complex medical illnesses. Will discuss changing atenolol to metoprolol with MD given atenolol being renally processed. If she has further AF that proves to be hemodynamically troublesome, we could consider amiodarone. She is not a candidate for anticoagulation at present time with her persistent anemia and GIB. We can reassess as an outpatient depending on her improvement. TSH wnl 11/12/14.  She continues with intermittent anemia requiring transfusion. Will defer continuation of aspirin and Lovenox to her primary team. From an atrial fibrillation standpoint, aspirin would offer no benefit for stroke prophylaxis  as previously thought, but she came in to the hospital on this medicine perhaps for other reasons.  Signed, Melina Copa PA-C   Attending Note:   The patient was seen and examined.  Agree with assessment and plan as noted above.  Changes made to the above note as needed.  Pt has lots of general medical issues She has  PAF - she is not a candidate for anticoagulation. Would continue rate control at this time.  Will DC atenolol and start metoprolol 25 mg BID.   Will sign off.  Call for questions.    Thayer Headings, Brooke Bonito., MD, [facility] 11/13/2014, 1:28 PM [address]  [place] Pager [phone]

## 2014-11-14 LAB — CBC
HCT: 25.2 % — ABNORMAL LOW (ref 36.0–46.0)
Hemoglobin: 7.9 g/dL — ABNORMAL LOW (ref 12.0–15.0)
MCH: 31 pg (ref 26.0–34.0)
MCHC: 31.3 g/dL (ref 30.0–36.0)
MCV: 98.8 fL (ref 78.0–100.0)
PLATELETS: 167 10*3/uL (ref 150–400)
RBC: 2.55 MIL/uL — AB (ref 3.87–5.11)
RDW: 19.7 % — AB (ref 11.5–15.5)
WBC: 8.4 10*3/uL (ref 4.0–10.5)

## 2014-11-14 LAB — RENAL FUNCTION PANEL
ALBUMIN: 2.9 g/dL — AB (ref 3.5–5.2)
ANION GAP: 16 — AB (ref 5–15)
BUN: 103 mg/dL — ABNORMAL HIGH (ref 6–23)
CO2: 24 meq/L (ref 19–32)
Calcium: 8.4 mg/dL (ref 8.4–10.5)
Chloride: 100 mEq/L (ref 96–112)
Creatinine, Ser: 3.24 mg/dL — ABNORMAL HIGH (ref 0.50–1.10)
GFR, EST AFRICAN AMERICAN: 15 mL/min — AB (ref >90)
GFR, EST NON AFRICAN AMERICAN: 13 mL/min — AB (ref >90)
Glucose, Bld: 278 mg/dL — ABNORMAL HIGH (ref 70–99)
POTASSIUM: 4.1 meq/L (ref 3.7–5.3)
Phosphorus: 4.9 mg/dL — ABNORMAL HIGH (ref 2.3–4.6)
Sodium: 140 mEq/L (ref 137–147)

## 2014-11-14 LAB — PTH, INTACT AND CALCIUM
Calcium, Total (PTH): 8.3 mg/dL — ABNORMAL LOW (ref 8.4–10.5)
PTH: 112 pg/mL — ABNORMAL HIGH (ref 14–64)

## 2014-11-15 LAB — CBC
HEMATOCRIT: 24.5 % — AB (ref 36.0–46.0)
HEMOGLOBIN: 7.6 g/dL — AB (ref 12.0–15.0)
MCH: 30.2 pg (ref 26.0–34.0)
MCHC: 31 g/dL (ref 30.0–36.0)
MCV: 97.2 fL (ref 78.0–100.0)
Platelets: 173 10*3/uL (ref 150–400)
RBC: 2.52 MIL/uL — ABNORMAL LOW (ref 3.87–5.11)
RDW: 19 % — ABNORMAL HIGH (ref 11.5–15.5)
WBC: 9.2 10*3/uL (ref 4.0–10.5)

## 2014-11-15 LAB — RENAL FUNCTION PANEL
ANION GAP: 13 (ref 5–15)
Albumin: 2.9 g/dL — ABNORMAL LOW (ref 3.5–5.2)
BUN: 114 mg/dL — ABNORMAL HIGH (ref 6–23)
CHLORIDE: 99 meq/L (ref 96–112)
CO2: 26 mEq/L (ref 19–32)
Calcium: 8.3 mg/dL — ABNORMAL LOW (ref 8.4–10.5)
Creatinine, Ser: 3.2 mg/dL — ABNORMAL HIGH (ref 0.50–1.10)
GFR calc Af Amer: 15 mL/min — ABNORMAL LOW (ref >90)
GFR calc non Af Amer: 13 mL/min — ABNORMAL LOW (ref >90)
GLUCOSE: 196 mg/dL — AB (ref 70–99)
POTASSIUM: 4.2 meq/L (ref 3.7–5.3)
Phosphorus: 4.8 mg/dL — ABNORMAL HIGH (ref 2.3–4.6)
Sodium: 138 mEq/L (ref 137–147)

## 2014-11-16 LAB — CBC
HCT: 23.3 % — ABNORMAL LOW (ref 36.0–46.0)
Hemoglobin: 7.2 g/dL — ABNORMAL LOW (ref 12.0–15.0)
MCH: 31.2 pg (ref 26.0–34.0)
MCHC: 30.9 g/dL (ref 30.0–36.0)
MCV: 100.9 fL — ABNORMAL HIGH (ref 78.0–100.0)
PLATELETS: 164 10*3/uL (ref 150–400)
RBC: 2.31 MIL/uL — ABNORMAL LOW (ref 3.87–5.11)
RDW: 19.3 % — AB (ref 11.5–15.5)
WBC: 7.6 10*3/uL (ref 4.0–10.5)

## 2014-11-16 LAB — TYPE AND SCREEN
ABO/RH(D): O POS
ANTIBODY SCREEN: NEGATIVE
Unit division: 0
Unit division: 0

## 2014-11-16 LAB — BASIC METABOLIC PANEL
ANION GAP: 11 (ref 5–15)
BUN: 48 mg/dL — ABNORMAL HIGH (ref 6–23)
CALCIUM: 8 mg/dL — AB (ref 8.4–10.5)
CHLORIDE: 99 meq/L (ref 96–112)
CO2: 26 mEq/L (ref 19–32)
Creatinine, Ser: 2.08 mg/dL — ABNORMAL HIGH (ref 0.50–1.10)
GFR calc non Af Amer: 22 mL/min — ABNORMAL LOW (ref >90)
GFR, EST AFRICAN AMERICAN: 25 mL/min — AB (ref >90)
Glucose, Bld: 187 mg/dL — ABNORMAL HIGH (ref 70–99)
Potassium: 4.1 mEq/L (ref 3.7–5.3)
Sodium: 136 mEq/L — ABNORMAL LOW (ref 137–147)

## 2014-11-16 LAB — PREPARE RBC (CROSSMATCH)

## 2014-11-17 LAB — BASIC METABOLIC PANEL
Anion gap: 12 (ref 5–15)
BUN: 55 mg/dL — AB (ref 6–23)
CO2: 26 mEq/L (ref 19–32)
Calcium: 8.2 mg/dL — ABNORMAL LOW (ref 8.4–10.5)
Chloride: 98 mEq/L (ref 96–112)
Creatinine, Ser: 2.68 mg/dL — ABNORMAL HIGH (ref 0.50–1.10)
GFR calc Af Amer: 19 mL/min — ABNORMAL LOW (ref >90)
GFR, EST NON AFRICAN AMERICAN: 16 mL/min — AB (ref >90)
GLUCOSE: 161 mg/dL — AB (ref 70–99)
POTASSIUM: 4 meq/L (ref 3.7–5.3)
Sodium: 136 mEq/L — ABNORMAL LOW (ref 137–147)

## 2014-11-17 LAB — RENAL FUNCTION PANEL
Albumin: 2.9 g/dL — ABNORMAL LOW (ref 3.5–5.2)
Anion gap: 11 (ref 5–15)
BUN: 29 mg/dL — ABNORMAL HIGH (ref 6–23)
CO2: 28 meq/L (ref 19–32)
CREATININE: 1.47 mg/dL — AB (ref 0.50–1.10)
Calcium: 8.1 mg/dL — ABNORMAL LOW (ref 8.4–10.5)
Chloride: 101 mEq/L (ref 96–112)
GFR calc Af Amer: 39 mL/min — ABNORMAL LOW (ref >90)
GFR calc non Af Amer: 33 mL/min — ABNORMAL LOW (ref >90)
GLUCOSE: 125 mg/dL — AB (ref 70–99)
Phosphorus: 2.5 mg/dL (ref 2.3–4.6)
Potassium: 3.6 mEq/L — ABNORMAL LOW (ref 3.7–5.3)
Sodium: 140 mEq/L (ref 137–147)

## 2014-11-17 LAB — TYPE AND SCREEN
ABO/RH(D): O POS
ANTIBODY SCREEN: NEGATIVE
UNIT DIVISION: 0

## 2014-11-17 LAB — CBC
HCT: 27 % — ABNORMAL LOW (ref 36.0–46.0)
HCT: 29.5 % — ABNORMAL LOW (ref 36.0–46.0)
HEMOGLOBIN: 8.3 g/dL — AB (ref 12.0–15.0)
HEMOGLOBIN: 9.3 g/dL — AB (ref 12.0–15.0)
MCH: 29.6 pg (ref 26.0–34.0)
MCH: 30.9 pg (ref 26.0–34.0)
MCHC: 30.7 g/dL (ref 30.0–36.0)
MCHC: 31.5 g/dL (ref 30.0–36.0)
MCV: 96.4 fL (ref 78.0–100.0)
MCV: 98 fL (ref 78.0–100.0)
PLATELETS: 139 10*3/uL — AB (ref 150–400)
Platelets: 143 10*3/uL — ABNORMAL LOW (ref 150–400)
RBC: 2.8 MIL/uL — ABNORMAL LOW (ref 3.87–5.11)
RBC: 3.01 MIL/uL — AB (ref 3.87–5.11)
RDW: 19.1 % — ABNORMAL HIGH (ref 11.5–15.5)
RDW: 19.5 % — ABNORMAL HIGH (ref 11.5–15.5)
WBC: 8.2 10*3/uL (ref 4.0–10.5)
WBC: 9 10*3/uL (ref 4.0–10.5)

## 2014-11-17 LAB — PTH, INTACT AND CALCIUM
Calcium, Total (PTH): 8.1 mg/dL — ABNORMAL LOW (ref 8.4–10.5)
PTH: 90 pg/mL — AB (ref 14–64)

## 2014-11-18 LAB — BASIC METABOLIC PANEL
Anion gap: 10 (ref 5–15)
BUN: 18 mg/dL (ref 6–23)
CHLORIDE: 101 meq/L (ref 96–112)
CO2: 29 mEq/L (ref 19–32)
Calcium: 8.2 mg/dL — ABNORMAL LOW (ref 8.4–10.5)
Creatinine, Ser: 1.82 mg/dL — ABNORMAL HIGH (ref 0.50–1.10)
GFR calc Af Amer: 30 mL/min — ABNORMAL LOW (ref >90)
GFR calc non Af Amer: 26 mL/min — ABNORMAL LOW (ref >90)
GLUCOSE: 141 mg/dL — AB (ref 70–99)
POTASSIUM: 3.6 meq/L — AB (ref 3.7–5.3)
Sodium: 140 mEq/L (ref 137–147)

## 2014-11-18 LAB — OCCULT BLOOD X 1 CARD TO LAB, STOOL: Fecal Occult Bld: POSITIVE — AB

## 2014-11-20 ENCOUNTER — Other Ambulatory Visit (HOSPITAL_COMMUNITY): Payer: Self-pay

## 2014-11-20 DIAGNOSIS — J9690 Respiratory failure, unspecified, unspecified whether with hypoxia or hypercapnia: Secondary | ICD-10-CM | POA: Diagnosis not present

## 2014-11-20 DIAGNOSIS — I509 Heart failure, unspecified: Secondary | ICD-10-CM | POA: Diagnosis not present

## 2014-11-20 DIAGNOSIS — J90 Pleural effusion, not elsewhere classified: Secondary | ICD-10-CM | POA: Diagnosis not present

## 2014-11-20 LAB — MAGNESIUM: Magnesium: 1.9 mg/dL (ref 1.5–2.5)

## 2014-11-20 LAB — CBC WITH DIFFERENTIAL/PLATELET
Basophils Absolute: 0 10*3/uL (ref 0.0–0.1)
Basophils Relative: 0 % (ref 0–1)
EOS PCT: 3 % (ref 0–5)
Eosinophils Absolute: 0.3 10*3/uL (ref 0.0–0.7)
HEMATOCRIT: 29.9 % — AB (ref 36.0–46.0)
Hemoglobin: 9.1 g/dL — ABNORMAL LOW (ref 12.0–15.0)
LYMPHS ABS: 1.9 10*3/uL (ref 0.7–4.0)
LYMPHS PCT: 22 % (ref 12–46)
MCH: 30.8 pg (ref 26.0–34.0)
MCHC: 30.4 g/dL (ref 30.0–36.0)
MCV: 101.4 fL — AB (ref 78.0–100.0)
MONO ABS: 0.8 10*3/uL (ref 0.1–1.0)
Monocytes Relative: 9 % (ref 3–12)
NEUTROS ABS: 5.7 10*3/uL (ref 1.7–7.7)
Neutrophils Relative %: 66 % (ref 43–77)
PLATELETS: 177 10*3/uL (ref 150–400)
RBC: 2.95 MIL/uL — AB (ref 3.87–5.11)
RDW: 19.9 % — ABNORMAL HIGH (ref 11.5–15.5)
WBC: 8.6 10*3/uL (ref 4.0–10.5)

## 2014-11-20 LAB — RENAL FUNCTION PANEL
ALBUMIN: 2.7 g/dL — AB (ref 3.5–5.2)
ANION GAP: 12 (ref 5–15)
BUN: 26 mg/dL — AB (ref 6–23)
CHLORIDE: 97 meq/L (ref 96–112)
CO2: 27 mEq/L (ref 19–32)
Calcium: 8.3 mg/dL — ABNORMAL LOW (ref 8.4–10.5)
Creatinine, Ser: 2.94 mg/dL — ABNORMAL HIGH (ref 0.50–1.10)
GFR calc non Af Amer: 14 mL/min — ABNORMAL LOW (ref >90)
GFR, EST AFRICAN AMERICAN: 17 mL/min — AB (ref >90)
Glucose, Bld: 162 mg/dL — ABNORMAL HIGH (ref 70–99)
POTASSIUM: 3.7 meq/L (ref 3.7–5.3)
Phosphorus: 4.8 mg/dL — ABNORMAL HIGH (ref 2.3–4.6)
SODIUM: 136 meq/L — AB (ref 137–147)

## 2014-11-20 LAB — PROCALCITONIN: Procalcitonin: 0.37 ng/mL

## 2014-11-22 LAB — CBC
HEMATOCRIT: 30 % — AB (ref 36.0–46.0)
Hemoglobin: 9 g/dL — ABNORMAL LOW (ref 12.0–15.0)
MCH: 30.2 pg (ref 26.0–34.0)
MCHC: 30 g/dL (ref 30.0–36.0)
MCV: 100.7 fL — ABNORMAL HIGH (ref 78.0–100.0)
PLATELETS: 175 10*3/uL (ref 150–400)
RBC: 2.98 MIL/uL — ABNORMAL LOW (ref 3.87–5.11)
RDW: 20.2 % — AB (ref 11.5–15.5)
WBC: 6.9 10*3/uL (ref 4.0–10.5)

## 2014-11-22 LAB — RENAL FUNCTION PANEL
Albumin: 2.8 g/dL — ABNORMAL LOW (ref 3.5–5.2)
Anion gap: 9 (ref 5–15)
BUN: 15 mg/dL (ref 6–23)
CHLORIDE: 103 meq/L (ref 96–112)
CO2: 29 mEq/L (ref 19–32)
Calcium: 8.6 mg/dL (ref 8.4–10.5)
Creatinine, Ser: 2.46 mg/dL — ABNORMAL HIGH (ref 0.50–1.10)
GFR, EST AFRICAN AMERICAN: 21 mL/min — AB (ref >90)
GFR, EST NON AFRICAN AMERICAN: 18 mL/min — AB (ref >90)
Glucose, Bld: 69 mg/dL — ABNORMAL LOW (ref 70–99)
PHOSPHORUS: 3.2 mg/dL (ref 2.3–4.6)
POTASSIUM: 3.8 meq/L (ref 3.7–5.3)
SODIUM: 141 meq/L (ref 137–147)

## 2014-11-23 LAB — RENAL FUNCTION PANEL
ALBUMIN: 2.9 g/dL — AB (ref 3.5–5.2)
Anion gap: 11 (ref 5–15)
BUN: 8 mg/dL (ref 6–23)
CO2: 28 mEq/L (ref 19–32)
Calcium: 8.4 mg/dL (ref 8.4–10.5)
Chloride: 102 mEq/L (ref 96–112)
Creatinine, Ser: 1.9 mg/dL — ABNORMAL HIGH (ref 0.50–1.10)
GFR, EST AFRICAN AMERICAN: 28 mL/min — AB (ref >90)
GFR, EST NON AFRICAN AMERICAN: 25 mL/min — AB (ref >90)
Glucose, Bld: 91 mg/dL (ref 70–99)
PHOSPHORUS: 2.4 mg/dL (ref 2.3–4.6)
Potassium: 3.7 mEq/L (ref 3.7–5.3)
SODIUM: 141 meq/L (ref 137–147)

## 2014-11-24 LAB — CBC
HCT: 30.3 % — ABNORMAL LOW (ref 36.0–46.0)
Hemoglobin: 8.9 g/dL — ABNORMAL LOW (ref 12.0–15.0)
MCH: 30.2 pg (ref 26.0–34.0)
MCHC: 29.4 g/dL — AB (ref 30.0–36.0)
MCV: 102.7 fL — ABNORMAL HIGH (ref 78.0–100.0)
PLATELETS: 225 10*3/uL (ref 150–400)
RBC: 2.95 MIL/uL — AB (ref 3.87–5.11)
RDW: 19.8 % — ABNORMAL HIGH (ref 11.5–15.5)
WBC: 7 10*3/uL (ref 4.0–10.5)

## 2014-11-24 LAB — RENAL FUNCTION PANEL
ANION GAP: 8 (ref 5–15)
Albumin: 2.8 g/dL — ABNORMAL LOW (ref 3.5–5.2)
BUN: 16 mg/dL (ref 6–23)
CO2: 30 meq/L (ref 19–32)
Calcium: 8.5 mg/dL (ref 8.4–10.5)
Chloride: 103 mEq/L (ref 96–112)
Creatinine, Ser: 2.7 mg/dL — ABNORMAL HIGH (ref 0.50–1.10)
GFR, EST AFRICAN AMERICAN: 19 mL/min — AB (ref >90)
GFR, EST NON AFRICAN AMERICAN: 16 mL/min — AB (ref >90)
Glucose, Bld: 99 mg/dL (ref 70–99)
Phosphorus: 2.5 mg/dL (ref 2.3–4.6)
Potassium: 3.6 mEq/L — ABNORMAL LOW (ref 3.7–5.3)
SODIUM: 141 meq/L (ref 137–147)

## 2014-11-25 LAB — CK: Total CK: 11 U/L (ref 7–177)

## 2014-11-27 LAB — CBC
HCT: 30.6 % — ABNORMAL LOW (ref 36.0–46.0)
Hemoglobin: 9 g/dL — ABNORMAL LOW (ref 12.0–15.0)
MCH: 29.9 pg (ref 26.0–34.0)
MCHC: 29.4 g/dL — ABNORMAL LOW (ref 30.0–36.0)
MCV: 101.7 fL — AB (ref 78.0–100.0)
Platelets: 166 10*3/uL (ref 150–400)
RBC: 3.01 MIL/uL — AB (ref 3.87–5.11)
RDW: 18.8 % — AB (ref 11.5–15.5)
WBC: 7.2 10*3/uL (ref 4.0–10.5)

## 2014-11-27 LAB — BASIC METABOLIC PANEL
Anion gap: 10 (ref 5–15)
BUN: 28 mg/dL — ABNORMAL HIGH (ref 6–23)
CO2: 29 mEq/L (ref 19–32)
CREATININE: 3.49 mg/dL — AB (ref 0.50–1.10)
Calcium: 9 mg/dL (ref 8.4–10.5)
Chloride: 96 mEq/L (ref 96–112)
GFR, EST AFRICAN AMERICAN: 14 mL/min — AB (ref >90)
GFR, EST NON AFRICAN AMERICAN: 12 mL/min — AB (ref >90)
GLUCOSE: 124 mg/dL — AB (ref 70–99)
POTASSIUM: 4.1 meq/L (ref 3.7–5.3)
Sodium: 135 mEq/L — ABNORMAL LOW (ref 137–147)

## 2014-11-27 LAB — RENAL FUNCTION PANEL
Albumin: 2.8 g/dL — ABNORMAL LOW (ref 3.5–5.2)
Anion gap: 10 (ref 5–15)
BUN: 28 mg/dL — ABNORMAL HIGH (ref 6–23)
CO2: 29 meq/L (ref 19–32)
Calcium: 8.9 mg/dL (ref 8.4–10.5)
Chloride: 96 mEq/L (ref 96–112)
Creatinine, Ser: 3.48 mg/dL — ABNORMAL HIGH (ref 0.50–1.10)
GFR, EST AFRICAN AMERICAN: 14 mL/min — AB (ref >90)
GFR, EST NON AFRICAN AMERICAN: 12 mL/min — AB (ref >90)
Glucose, Bld: 122 mg/dL — ABNORMAL HIGH (ref 70–99)
Phosphorus: 2.9 mg/dL (ref 2.3–4.6)
Potassium: 4.1 mEq/L (ref 3.7–5.3)
SODIUM: 135 meq/L — AB (ref 137–147)

## 2014-11-28 DIAGNOSIS — N186 End stage renal disease: Secondary | ICD-10-CM

## 2014-11-28 NOTE — Consult Note (Signed)
Hospital Consult  Reason for Consult:  In need of permanent HD access Referring Physician:  Hijazi MRN #:  [id number]  History of Present Illness: This is a 78 y.o. female who was admitted to [facility] on October 17, 2014 with weakness and worsening lower extremity edema.  She was also having worsening dyspnea.  She was diuresed for over 14L.  She has had worsening kidney failure.  Also, during her hospitalization, she was noted to have a GIB and underwent EGD on 10/24/14, which revealed reflux esophagitis, gastritis, and duodenitis and she was placed on a PPI.  She also had a UTI with E coli that was treated.  She was admitted to [facility] on 11/02/14.  She has a hx of CKD and is now on HD with no renal recovery.  She is getting a tunneled HD catheter placed in IR and is now in need of permanent access.    She has never smoked.  She is right hand dominant.  She has a hx of right arm fracture and has had surgery on this arm.  She also has a hx of CHF HTN, DM, which she takes insulin, peripheral neuropathy, OA, and anxiety.    Past Medical History  Diagnosis Date  . CHF (congestive heart failure) 05/13/2011    echo - [id number]; stage 1 diastolic dysfunction; elevated LV filling pressure, MAC  . Chest pain, atypical 12/05/2008    R/Lmv- normal perfusion all regions, noe ECG changes   . Renal insufficiency 10/30/2005    doppler - abn resistance consistent w/ parenchymal disease  . Claudication 10/30/2005    doppler - normal evaluation, no evidence of aneurysm, diameter reduction, dissection, compression or vascular abnormality)  . DM (diabetes mellitus)   . Peripheral neuropathy   . Hypertension   . Edema   . Gout   . Anxiety   . Arthritis   . Cataract   . GERD (gastroesophageal reflux disease)   . Breast cancer 1980s    bil mastectomies, no radiation or chemo    Past Surgical History  Procedure Laterality Date  . Mastectomy Bilateral ~1982    bil breast reconstruction with implants    . Back surgery  ~1980  . Hemorrhoid surgery    . Cholecystectomy    . Eye surgery    . Orif tibia & fibula fractures Left 2007    also had left non displaced malleolar fracture.   . Esophagogastroduodenoscopy N/A 10/24/2014    Procedure: ESOPHAGOGASTRODUODENOSCOPY (EGD);  Surgeon: Jerene Bears, MD;  Location: [facility] ENDOSCOPY;  Service: Endoscopy;  Laterality: N/A;    Allergies  Allergen Reactions  . Erythromycin Swelling  . Penicillins     She has taken keflex many times without porblems  . Codeine Rash    Prior to Admission medications   Medication Sig Start Date End Date Taking? Authorizing Provider  aspirin EC 81 MG tablet Take 81 mg by mouth daily.    Historical Provider, MD  atenolol (TENORMIN) 50 MG tablet Take 50 mg by mouth daily.    Historical Provider, MD  brimonidine (ALPHAGAN) 0.2 % ophthalmic solution 3 (three) times daily.    Historical Provider, MD  calcium acetate (PHOSLO) 667 MG capsule Take 1 capsule (667 mg total) by mouth 3 (three) times daily with meals. 11/02/14   Geradine Girt, DO  cephALEXin (KEFLEX) 250 MG capsule Take 1 capsule (250 mg total) by mouth every 12 (twelve) hours. 11/02/14   Geradine Girt, DO  Darbepoetin Alfa Kyra Searles)  100 MCG/0.5ML SOSY injection Inject 0.5 mLs (100 mcg total) into the skin every Friday at 6 PM. 11/02/14   Geradine Girt, DO  diazepam (VALIUM) 5 MG tablet Take 5 mg by mouth every 6 (six) hours as needed for anxiety.     Historical Provider, MD  dorzolamide-timolol (COSOPT) 22.3-6.8 MG/ML ophthalmic solution 1 drop 2 (two) times daily.    Historical Provider, MD  enoxaparin (LOVENOX) 30 MG/0.3ML injection Inject 0.3 mLs (30 mg total) into the skin daily. 11/02/14   Geradine Girt, DO  furosemide (LASIX) 80 MG tablet Take 2 tablets (160 mg total) by mouth 3 (three) times daily. 11/02/14   Geradine Girt, DO  hydrALAZINE (APRESOLINE) 20 MG/ML injection Inject 0.5 mLs (10 mg total) into the vein every 6 (six) hours as needed (IF sbp  >160 mmHg). 11/02/14   Geradine Girt, DO  hydrALAZINE (APRESOLINE) 25 MG tablet Take 3 tablets (75 mg total) by mouth every 8 (eight) hours. 11/02/14   Geradine Girt, DO  insulin aspart (NOVOLOG) 100 UNIT/ML injection Inject 0-9 Units into the skin 3 (three) times daily with meals. 11/02/14   Geradine Girt, DO  insulin aspart (NOVOLOG) 100 UNIT/ML injection Inject 5 Units into the skin 3 (three) times daily with meals. 11/02/14   Geradine Girt, DO  insulin glargine (LANTUS) 100 UNIT/ML injection Inject 0.24 mLs (24 Units total) into the skin at bedtime. 11/02/14   Geradine Girt, DO  Nutritional Supplements (FEEDING SUPPLEMENT, NEPRO CARB STEADY,) LIQD Take 237 mLs by mouth daily. 11/02/14   Geradine Girt, DO  ondansetron (ZOFRAN) 4 MG/2ML SOLN injection Inject 2 mLs (4 mg total) into the vein every 6 (six) hours as needed for nausea. 11/02/14   Geradine Girt, DO  oxyCODONE (OXY IR/ROXICODONE) 5 MG immediate release tablet Take 0.5 tablets (2.5 mg total) by mouth every 4 (four) hours as needed for severe pain. 11/02/14   Geradine Girt, DO  pantoprazole (PROTONIX) 40 MG tablet Take 1 tablet (40 mg total) by mouth daily. 11/02/14   Geradine Girt, DO  polyethylene glycol (MIRALAX / GLYCOLAX) packet Take 17 g by mouth daily as needed for mild constipation. 11/02/14   Geradine Girt, DO  potassium chloride SA (K-DUR,KLOR-CON) 20 MEQ tablet Take 1 tablet (20 mEq total) by mouth daily. 11/02/14   Geradine Girt, DO  simvastatin (ZOCOR) 20 MG tablet Take 20 mg by mouth every evening.    Historical Provider, MD    History   Social History  . Marital Status: Divorced    Spouse Name: N/A    Number of Children: N/A  . Years of Education: N/A   Occupational History  . Not on file.   Social History Main Topics  . Smoking status: Never Smoker   . Smokeless tobacco: Not on file  . Alcohol Use: No  . Drug Use: Not on file  . Sexual Activity: Not on file   Other Topics Concern  . Not on file     Social History Narrative   Family History  Problem Relation Age of Onset  . Cancer Mother   . Diabetes Mother   . Heart disease Mother   . Heart attack Father   . Stroke Father   . Parkinson's disease Brother   . Cancer Sister     ROS: [x]  Positive   [ ]  Negative   [ ]  All sytems reviewed and are negative  Cardiovascular: []  chest pain/pressure []   palpitations []  SOB lying flat [x]  DOE []  pain in legs while walking []  pain in legs at rest []  pain in legs at night []  non-healing ulcers []  hx of DVT [x]  swelling in legs  Pulmonary: []  productive cough []  asthma/wheezing []  home O2  Neurologic: [x]  weakness in []  arms [x]  legs []  numbness in []  arms []  legs []  hx of CVA []  mini stroke [] difficulty speaking or slurred speech []  temporary loss of vision in one eye []  dizziness  Hematologic: []  hx of cancer [x]  bleeding problems-recent GIB []  problems with blood clotting easily  Endocrine:   [x]  diabetes []  thyroid disease  GI []  vomiting blood []  blood in stool [x]  hx GIB  GU: [x]  CKD/renal failure [x]  HD--[x] M/W/F or []  T/T/S [x]  does not make urine according to daughter []  burning with urination []  blood in urine  Psychiatric: [x]  anxiety []  depression  Musculoskeletal: []  arthritis []  joint pain  Integumentary: []  rashes []  ulcers  Constitutional: []  fever []  chills   Physical Examination  Filed Vitals:   11/12/14 1100  BP: 146/56  Pulse: 57  Resp: 17   There is no weight on file to calculate BMI.  General:  WDWN, obese female in NAD Gait: Not observed HENT: WNL, normocephalic Pulmonary: normal non-labored breathing, without Rales, rhonchi,  Wheezing; on O2 Cardiac: regular, without  Murmurs, rubs or gallops; without carotid bruit on the left-unable to auscultate the right. Abdomen: soft, NT/ND, no masses Skin: without rashes, without ulcers  Vascular Exam/Pulses:  Right Left  Radial 2+ (normal) 2+ (normal)  Ulnar Unable  to palpate  2+ (normal)  Brachial Unable to palpate  2+ (normal)  DP 1+ (weak) 2+ (normal)  PT Unable to palpate  Unable to palpate    Extremities: without ischemic changes, without Gangrene , without cellulitis; without open wounds;  2-3+ edema BLE; scars right arm from fracture of LUE Musculoskeletal: no muscle wasting or atrophy  Neurologic: A&O X 3; Appropriate Affect ; SENSATION: normal; MOTOR FUNCTION:  moving all extremities equally. Speech is fluent/normal   CBC    Component Value Date/Time   WBC 7.2 11/27/2014 0500   WBC 11.6* 04/02/2014 0908   WBC 13.8* 09/06/2008 1552   RBC 3.01* 11/27/2014 0500   RBC 3.28* 10/17/2014 2256   RBC 3.99* 04/02/2014 0908   RBC 4.03 09/06/2008 1552   HGB 9.0* 11/27/2014 0500   HGB 11.4* 04/02/2014 0908   HGB 13.0 09/06/2008 1552   HCT 30.6* 11/27/2014 0500   HCT 38.1 04/02/2014 0908   HCT 38.3 09/06/2008 1552   PLT 166 11/27/2014 0500   PLT 188 09/06/2008 1552   MCV 101.7* 11/27/2014 0500   MCV 95.6 04/02/2014 0908   MCV 95.0 09/06/2008 1552   MCH 29.9 11/27/2014 0500   MCH 28.6 04/02/2014 0908   MCH 32.2 09/06/2008 1552   MCHC 29.4* 11/27/2014 0500   MCHC 29.9* 04/02/2014 0908   MCHC 33.9 09/06/2008 1552   RDW 18.8* 11/27/2014 0500   RDW 15.3* 09/06/2008 1552   LYMPHSABS 1.9 11/20/2014 0500   LYMPHSABS 4.2* 09/06/2008 1552   MONOABS 0.8 11/20/2014 0500   MONOABS 0.7 09/06/2008 1552   EOSABS 0.3 11/20/2014 0500   EOSABS 0.2 09/06/2008 1552   BASOSABS 0.0 11/20/2014 0500   BASOSABS 0.0 09/06/2008 1552    BMET    Component Value Date/Time   NA 135* 11/27/2014 0500   NA 135* 11/27/2014 0500   K 4.1 11/27/2014 0500   K 4.1 11/27/2014 0500  CL 96 11/27/2014 0500   CL 96 11/27/2014 0500   CO2 29 11/27/2014 0500   CO2 29 11/27/2014 0500   GLUCOSE 122* 11/27/2014 0500   GLUCOSE 124* 11/27/2014 0500   BUN 28* 11/27/2014 0500   BUN 28* 11/27/2014 0500   CREATININE 3.48* 11/27/2014 0500   CREATININE 3.49* 11/27/2014  0500   CREATININE 1.86* 04/02/2014 0907   CALCIUM 8.9 11/27/2014 0500   CALCIUM 9.0 11/27/2014 0500   CALCIUM 8.1* 11/15/2014 0625   GFRNONAA 12* 11/27/2014 0500   GFRNONAA 12* 11/27/2014 0500   GFRAA 14* 11/27/2014 0500   GFRAA 14* 11/27/2014 0500    COAGS: Lab Results  Component Value Date   INR 1.14 10/17/2014   Non-Invasive Vascular Imaging:   None as of now-vein mapping ordered.  ASSESSMENT/PLAN: This is a 78 y.o. female who was admitted to the hospital in October with acute on chronic diastolic CHF and CKD that has progressed to renal failure.    -pt has a tunneled HD catheter that was placed in IR -will order BUE vein mapping  -pt is right hand dominant, so we will restrict the LUE. She does have a hx of surgery on the right arm from fracture in the past. -further plans once vein mapping is complete.  Leontine Locket, PA-C [facility] [phone]  Agree with above. In addition, we will order up her extremity arterial Doppler study. Pending the results of her vein map and upper extremity arterial Doppler study, we will try to place a left arm fistula or graft before she is discharged from [facility]. However, today she seems markedly debilitated and does not look to be strong enough for surgery in the next few days. I will follow.  [name], MD, [place] [phone] 11/28/2014

## 2014-11-29 DIAGNOSIS — N179 Acute kidney failure, unspecified: Secondary | ICD-10-CM | POA: Diagnosis not present

## 2014-11-29 LAB — RENAL FUNCTION PANEL
ALBUMIN: 2.7 g/dL — AB (ref 3.5–5.2)
ANION GAP: 11 (ref 5–15)
BUN: 37 mg/dL — AB (ref 6–23)
CALCIUM: 9.2 mg/dL (ref 8.4–10.5)
CO2: 27 mEq/L (ref 19–32)
CREATININE: 3.99 mg/dL — AB (ref 0.50–1.10)
Chloride: 99 mEq/L (ref 96–112)
GFR calc Af Amer: 12 mL/min — ABNORMAL LOW (ref >90)
GFR calc non Af Amer: 10 mL/min — ABNORMAL LOW (ref >90)
GLUCOSE: 154 mg/dL — AB (ref 70–99)
PHOSPHORUS: 2.9 mg/dL (ref 2.3–4.6)
Potassium: 4.9 mEq/L (ref 3.7–5.3)
Sodium: 137 mEq/L (ref 137–147)

## 2014-11-29 LAB — CBC
HEMATOCRIT: 29.9 % — AB (ref 36.0–46.0)
HEMOGLOBIN: 8.8 g/dL — AB (ref 12.0–15.0)
MCH: 30.2 pg (ref 26.0–34.0)
MCHC: 29.4 g/dL — ABNORMAL LOW (ref 30.0–36.0)
MCV: 102.7 fL — ABNORMAL HIGH (ref 78.0–100.0)
Platelets: 132 10*3/uL — ABNORMAL LOW (ref 150–400)
RBC: 2.91 MIL/uL — ABNORMAL LOW (ref 3.87–5.11)
RDW: 18.6 % — ABNORMAL HIGH (ref 11.5–15.5)
WBC: 10.2 10*3/uL (ref 4.0–10.5)

## 2014-11-29 LAB — APTT: aPTT: 21 seconds — ABNORMAL LOW (ref 24–37)

## 2014-11-29 NOTE — Consult Note (Signed)
Chief Complaint: Renal failure Temp cath in place Needs perm cath  Referring Physician(s): Dr Candiss Norse  History of Present Illness: [name] James is a 78 y.o. female   Renal failure To hospital 10/27 for weakness; lower extr edema; dyspnea Renal fxn worsening Started on dialysis with temp cath- placed in IR 11/25 Now now improvement in renal fxn Plan for dialysis graft placement in OR soon Need tunneled cath for now Scheduled for conversion to tunneled cath 12/10 per Renal MD request I have seen and examined pt afeb Lovenox held  Past Medical History  Diagnosis Date  . CHF (congestive heart failure) 05/13/2011    echo - [id number]; stage 1 diastolic dysfunction; elevated LV filling pressure, MAC  . Chest pain, atypical 12/05/2008    R/Lmv- normal perfusion all regions, noe ECG changes   . Renal insufficiency 10/30/2005    doppler - abn resistance consistent w/ parenchymal disease  . Claudication 10/30/2005    doppler - normal evaluation, no evidence of aneurysm, diameter reduction, dissection, compression or vascular abnormality)  . DM (diabetes mellitus)   . Peripheral neuropathy   . Hypertension   . Edema   . Gout   . Anxiety   . Arthritis   . Cataract   . GERD (gastroesophageal reflux disease)   . Breast cancer 1980s    bil mastectomies, no radiation or chemo    Past Surgical History  Procedure Laterality Date  . Mastectomy Bilateral ~1982    bil breast reconstruction with implants  . Back surgery  ~1980  . Hemorrhoid surgery    . Cholecystectomy    . Eye surgery    . Orif tibia & fibula fractures Left 2007    also had left non displaced malleolar fracture.   . Esophagogastroduodenoscopy N/A 10/24/2014    Procedure: ESOPHAGOGASTRODUODENOSCOPY (EGD);  Surgeon: Jerene Bears, MD;  Location: [facility];  Service: Endoscopy;  Laterality: N/A;    Allergies: Erythromycin; Penicillins; and Codeine  Medications: Prior to Admission medications   Medication Sig  Start Date End Date Taking? Authorizing Provider  aspirin EC 81 MG tablet Take 81 mg by mouth daily.    Historical Provider, MD  atenolol (TENORMIN) 50 MG tablet Take 50 mg by mouth daily.    Historical Provider, MD  brimonidine (ALPHAGAN) 0.2 % ophthalmic solution 3 (three) times daily.    Historical Provider, MD  calcium acetate (PHOSLO) 667 MG capsule Take 1 capsule (667 mg total) by mouth 3 (three) times daily with meals. 11/02/14   Geradine Girt, DO  cephALEXin (KEFLEX) 250 MG capsule Take 1 capsule (250 mg total) by mouth every 12 (twelve) hours. 11/02/14   Geradine Girt, DO  Darbepoetin Alfa (ARANESP) 100 MCG/0.5ML SOSY injection Inject 0.5 mLs (100 mcg total) into the skin every Friday at 6 PM. 11/02/14   Geradine Girt, DO  diazepam (VALIUM) 5 MG tablet Take 5 mg by mouth every 6 (six) hours as needed for anxiety.     Historical Provider, MD  dorzolamide-timolol (COSOPT) 22.3-6.8 MG/ML ophthalmic solution 1 drop 2 (two) times daily.    Historical Provider, MD  enoxaparin (LOVENOX) 30 MG/0.3ML injection Inject 0.3 mLs (30 mg total) into the skin daily. 11/02/14   Geradine Girt, DO  furosemide (LASIX) 80 MG tablet Take 2 tablets (160 mg total) by mouth 3 (three) times daily. 11/02/14   Geradine Girt, DO  hydrALAZINE (APRESOLINE) 20 MG/ML injection Inject 0.5 mLs (10 mg total) into the vein every 6 (  six) hours as needed (IF sbp >160 mmHg). 11/02/14   Geradine Girt, DO  hydrALAZINE (APRESOLINE) 25 MG tablet Take 3 tablets (75 mg total) by mouth every 8 (eight) hours. 11/02/14   Geradine Girt, DO  insulin aspart (NOVOLOG) 100 UNIT/ML injection Inject 0-9 Units into the skin 3 (three) times daily with meals. 11/02/14   Geradine Girt, DO  insulin aspart (NOVOLOG) 100 UNIT/ML injection Inject 5 Units into the skin 3 (three) times daily with meals. 11/02/14   Geradine Girt, DO  insulin glargine (LANTUS) 100 UNIT/ML injection Inject 0.24 mLs (24 Units total) into the skin at bedtime. 11/02/14    Geradine Girt, DO  Nutritional Supplements (FEEDING SUPPLEMENT, NEPRO CARB STEADY,) LIQD Take 237 mLs by mouth daily. 11/02/14   Geradine Girt, DO  ondansetron (ZOFRAN) 4 MG/2ML SOLN injection Inject 2 mLs (4 mg total) into the vein every 6 (six) hours as needed for nausea. 11/02/14   Geradine Girt, DO  oxyCODONE (OXY IR/ROXICODONE) 5 MG immediate release tablet Take 0.5 tablets (2.5 mg total) by mouth every 4 (four) hours as needed for severe pain. 11/02/14   Geradine Girt, DO  pantoprazole (PROTONIX) 40 MG tablet Take 1 tablet (40 mg total) by mouth daily. 11/02/14   Geradine Girt, DO  polyethylene glycol (MIRALAX / GLYCOLAX) packet Take 17 g by mouth daily as needed for mild constipation. 11/02/14   Geradine Girt, DO  potassium chloride SA (K-DUR,KLOR-CON) 20 MEQ tablet Take 1 tablet (20 mEq total) by mouth daily. 11/02/14   Geradine Girt, DO  simvastatin (ZOCOR) 20 MG tablet Take 20 mg by mouth every evening.    Historical Provider, MD    Family History  Problem Relation Age of Onset  . Cancer Mother   . Diabetes Mother   . Heart disease Mother   . Heart attack Father   . Stroke Father   . Parkinson's disease Brother   . Cancer Sister     History   Social History  . Marital Status: Divorced    Spouse Name: N/A    Number of Children: N/A  . Years of Education: N/A   Social History Main Topics  . Smoking status: Never Smoker   . Smokeless tobacco: Not on file  . Alcohol Use: No  . Drug Use: Not on file  . Sexual Activity: Not on file   Other Topics Concern  . Not on file   Social History Narrative    Review of Systems: A 12 point ROS discussed and pertinent positives are indicated in the HPI above.  All other systems are negative.  Review of Systems  Constitutional: Positive for fatigue.  Respiratory: Positive for shortness of breath.   Cardiovascular: Positive for leg swelling.  Neurological: Positive for weakness.  Psychiatric/Behavioral: Positive for  confusion.     Vital Signs: BP 146/56 mmHg  Pulse 57  Resp 17  SpO2 96%  Physical Exam  Cardiovascular: Normal rate and regular rhythm.   Pulmonary/Chest: Effort normal. She has wheezes.  Abdominal: Soft. Bowel sounds are normal.  Musculoskeletal: Normal range of motion.  Skin: Skin is warm and dry.  Psychiatric:  Confusion Consented dtr at bedside  Nursing note and vitals reviewed.   Imaging: Ir Fluoro Guide Cv Line Left  11/12/2014   CLINICAL DATA:  Renal failure requiring emergent hemodialysis.  EXAM: NON-TUNNELED CENTRAL VENOUS CATHETER PLACEMENT WITH ULTRASOUND AND FLUOROSCOPIC GUIDANCE  FLUOROSCOPY TIME:  12 seconds.  PROCEDURE:  The procedure, risks, benefits, and alternatives were explained to the patient's daughter. Questions regarding the procedure were encouraged and answered. The patient's daughter understands and consents to the procedure.  The left neck and chest were prepped with chlorhexidine in a sterile fashion, and a sterile drape was applied covering the operative field. Maximum barrier sterile technique with sterile gowns and gloves were used for the procedure. Local anesthesia was provided with 1% lidocaine. A time-out procedure was performed.  After creating a small venotomy incision, a 19 gauge needle was advanced into the left internal jugular vein under direct, real-time ultrasound guidance. Ultrasound image documentation was performed. Venous access was dilated over a guidewire. A 24 cm, 13 French Trialysis catheter was placed over the wire.  Final catheter positioning was confirmed and documented with a fluoroscopic spot image. The catheter was aspirated, flushed with saline, and injected with appropriate volume heparin dwells. The catheter exit site was secured with 0-Prolene retention sutures.  COMPLICATIONS: None.  No pneumothorax.  FINDINGS: After catheter placement, the tip lies in the right atrium. The catheter aspirates normally and is ready for immediate  use.  IMPRESSION: Placement of non-tunneled central venous catheter via the left internal jugular vein. The catheter tip lies in the right atrium. The catheter is ready for immediate use.   Electronically Signed   By: Aletta Edouard M.D.   On: 11/12/2014 12:13   Ir US Guide Vasc Access Left  11/12/2014   CLINICAL DATA:  Renal failure requiring emergent hemodialysis.  EXAM: NON-TUNNELED CENTRAL VENOUS CATHETER PLACEMENT WITH ULTRASOUND AND FLUOROSCOPIC GUIDANCE  FLUOROSCOPY TIME:  12 seconds.  PROCEDURE: The procedure, risks, benefits, and alternatives were explained to the patient's daughter. Questions regarding the procedure were encouraged and answered. The patient's daughter understands and consents to the procedure.  The left neck and chest were prepped with chlorhexidine in a sterile fashion, and a sterile drape was applied covering the operative field. Maximum barrier sterile technique with sterile gowns and gloves were used for the procedure. Local anesthesia was provided with 1% lidocaine. A time-out procedure was performed.  After creating a small venotomy incision, a 19 gauge needle was advanced into the left internal jugular vein under direct, real-time ultrasound guidance. Ultrasound image documentation was performed. Venous access was dilated over a guidewire. A 24 cm, 13 French Trialysis catheter was placed over the wire.  Final catheter positioning was confirmed and documented with a fluoroscopic spot image. The catheter was aspirated, flushed with saline, and injected with appropriate volume heparin dwells. The catheter exit site was secured with 0-Prolene retention sutures.  COMPLICATIONS: None.  No pneumothorax.  FINDINGS: After catheter placement, the tip lies in the right atrium. The catheter aspirates normally and is ready for immediate use.  IMPRESSION: Placement of non-tunneled central venous catheter via the left internal jugular vein. The catheter tip lies in the right atrium. The  catheter is ready for immediate use.   Electronically Signed   By: Aletta Edouard M.D.   On: 11/12/2014 12:13   Dg Chest Port 1 View  11/20/2014   CLINICAL DATA:  Respiratory failure.  EXAM: PORTABLE CHEST - 1 VIEW  COMPARISON:  11/13/2014.  FINDINGS: Bilateral IJ lines in stable position. Stable cardiomegaly. Diffuse bilateral pulmonary alveolar infiltrates and bilateral pleural effusions. Pulmonary infiltrates have slightly cleared. These findings are consistent with slight clearing of pulmonary edema . No pneumothorax . No acute bony abnormality.  IMPRESSION: 1. Bilateral IJ lines in stable position. 2. Persistent changes of congestive heart  failure with bilateral pulmonary edema and bilateral pleural effusions. Slight improvement of pulmonary edema noted.   Electronically Signed   By: Marcello Moores  Register   On: 11/20/2014 07:35   Dg Chest Port 1 View  11/13/2014   CLINICAL DATA:  Follow-up of pulmonary edema and pleural effusions  EXAM: PORTABLE CHEST - 1 VIEW  COMPARISON:  Portable chest x-ray of November 12, 2014  FINDINGS: The lungs are better inflated today. The pulmonary interstitial markings remain increased. The hemidiaphragms remain obscured. The heart is mildly enlarged. The pulmonary vascularity is engorged.  A dual-lumen large caliber catheter has been placed via the left internal jugular approach. Its tip projects over the midportion of the SVC. A small caliber right internal jugular venous catheter tip projects at the confluence of the right and left brachiocephalic veins. Its tip is oriented toward the left. The bony structures exhibit no acute abnormalities.  IMPRESSION: Congestive heart failure with bilateral pleural effusions and interstitial edema. Inflation of both lungs has improved. There is no pneumothorax following placement of the new left internal jugular venous catheter.   Electronically Signed   By: David  Martinique   On: 11/13/2014 07:54   Dg Chest Port 1 View  11/12/2014    CLINICAL DATA:  Pulmonary edema, history CHF, diabetes, hypertension  EXAM: PORTABLE CHEST - 1 VIEW  COMPARISON:  Portable exam 0642 hr compared to 11/11/2014  FINDINGS: RIGHT jugular line tip projects over SVC above cavoatrial junction.  Enlargement of cardiac silhouette with pulmonary vascular congestion.  Diffuse infiltrates most consistent with pulmonary edema and CHF.  Bibasilar effusions greater on LEFT.  No pneumothorax.  Scattered endplate spur formation thoracic spine.  Atherosclerotic calcification aorta.  IMPRESSION: CHF with bibasilar effusions greater on LEFT.  No significant interval change.   Electronically Signed   By: Lavonia Dana M.D.   On: 11/12/2014 08:49   Dg Chest Port 1 View  11/11/2014   CLINICAL DATA:  CHF with hypertension and diabetes.  EXAM: PORTABLE CHEST - 1 VIEW  COMPARISON:  11/10/2014  FINDINGS: Right IJ central venous catheter unchanged. Lungs are adequately inflated with persistent moderate bilateral perihilar opacification suggesting interstitial edema. Moderate opacification over the left base unchanged likely effusion with atelectasis. Small right effusion with slight improved aeration over the right base. Mild stable cardiomegaly. Remainder of the exam is unchanged.  IMPRESSION: Persistent left base opacification likely combination of effusion with atelectasis. Small right effusion with improved aeration over the right base.  Persistent moderate perihilar opacification likely interstitial edema. Mild stable cardiomegaly.   Electronically Signed   By: Marin Olp M.D.   On: 11/11/2014 09:01   Dg Chest Port 1 View  11/10/2014   CLINICAL DATA:  Subsequent encounter for congestive failure and pleural effusion.  EXAM: PORTABLE CHEST - 1 VIEW  COMPARISON:  11/09/2014  FINDINGS: 0645 hrs. Stable lung volumes with interval progression of pulmonary edema. Bibasilar atelectasis with small bilateral pleural effusions again noted. The cardio pericardial silhouette is enlarged.  Right IJ central line tip overlies the distal SVC level. Imaged bony structures of the thorax are intact.  IMPRESSION: Cardiomegaly with slight interval progression of pulmonary edema.  Persistent bilateral pleural effusions.   Electronically Signed   By: Misty Stanley M.D.   On: 11/10/2014 08:27   Dg Chest Port 1 View  11/09/2014   CLINICAL DATA:  Congestive heart failure.  EXAM: PORTABLE CHEST - 1 VIEW  COMPARISON:  November 08, 2014.  FINDINGS: Stable cardiomegaly. Right internal jugular catheter  line is unchanged with distal tip overlying expected position of the SVC. Slightly increased bilateral perihilar and basilar opacities are noted, consistent with edema and associated pleural effusions. Left is greater than right. No pneumothorax is noted.  IMPRESSION: Slightly increased bilateral perihilar and basilar edema is noted with associated pleural effusions consistent with congestive heart failure.   Electronically Signed   By: Sabino Dick M.D.   On: 11/09/2014 12:52   Dg Chest Port 1 View  11/08/2014   CLINICAL DATA:  CHF.  Shortness of breath.  EXAM: PORTABLE CHEST - 1 VIEW  COMPARISON:  11/07/2014  FINDINGS: Right jugular central venous catheter remains in place with tip projecting over the lower SVC, unchanged. Cardiac silhouette remains mildly enlarged, unchanged. Thoracic aortic calcification is noted. Pulmonary vascular congestion with perihilar and bibasilar opacities are stable to minimally improved from the prior study. Small bilateral pleural effusions persist. No pneumothorax is identified.  IMPRESSION: Stable to minimally improved pulmonary edema and bibasilar atelectasis. Persistent small bilateral pleural effusions.   Electronically Signed   By: Logan Bores   On: 11/08/2014 07:34   Dg Chest Port 1 View  11/07/2014   CLINICAL DATA:  Central line placement.  EXAM: PORTABLE CHEST - 1 VIEW  COMPARISON:  10/26/2014  FINDINGS: The right IJ catheter tip is in the distal SVC just above the  cavoatrial junction. No complicating features. The heart is mildly enlarged. Persistent perihilar pulmonary edema, bilateral pleural effusions and bibasilar atelectasis.  IMPRESSION: Right IJ central venous catheter tip is in the distal SVC near the cavoatrial junction.  Persistent edema, effusions and atelectasis.   Electronically Signed   By: Kalman Jewels M.D.   On: 11/07/2014 16:39    Labs:  CBC:  Recent Labs  11/22/14 0620 11/24/14 0640 11/27/14 0500 11/29/14 0620  WBC 6.9 7.0 7.2 10.2  HGB 9.0* 8.9* 9.0* 8.8*  HCT 30.0* 30.3* 30.6* 29.9*  PLT 175 225 166 132*    COAGS:  Recent Labs  10/17/14 1030  INR 1.14    BMP:  Recent Labs  11/23/14 0631 11/24/14 0640 11/27/14 0500 11/29/14 0620  NA 141 141 135*  135* 137  K 3.7 3.6* 4.1  4.1 4.9  CL 102 103 96  96 99  CO2 28 30 29  29 27   GLUCOSE 91 99 124*  122* 154*  BUN 8 16 28*  28* 37*  CALCIUM 8.4 8.5 9.0  8.9 9.2  CREATININE 1.90* 2.70* 3.49*  3.48* 3.99*  GFRNONAA 25* 16* 12*  12* 10*  GFRAA 28* 19* 14*  14* 12*    LIVER FUNCTION TESTS:  Recent Labs  11/03/14 0500  11/06/14 0755  11/08/14 0714  11/13/14 0500  11/23/14 0631 11/24/14 0640 11/27/14 0500 11/29/14 0620  BILITOT <0.2*  --  <0.2*  --  <0.2*  --  0.3  --   --   --   --   --   AST 82*  --  85*  --  45*  --  47*  --   --   --   --   --   ALT 74*  --  65*  --  47*  --  74*  --   --   --   --   --   ALKPHOS 87  --  81  --  84  --  102  --   --   --   --   --   PROT 5.9*  --  6.0  --  5.8*  --  5.8*  --   --   --   --   --   ALBUMIN 2.6*  < > 2.4*  < > 3.1*  3.2*  < > 3.0*  < > 2.9* 2.8* 2.8* 2.7*  < > = values in this interval not displayed.  TUMOR MARKERS: No results for input(s): AFPTM, CEA, CA199, CHROMGRNA in the last 8760 hours.  Assessment and Plan:  Temp dial cath placed in IR 11/25 Now need for perm cath--- Scheduled for OR for graft soon pts dtr aware of procedure benefits and risks and agreeable to  proceed Consent signed andin chart Lovenox held Vanco on call   Thank you for this interesting consult.  I greatly enjoyed meeting [name] James and look forward to participating in their care.    I spent a total of 20 minutes face to face in clinical consultation, greater than 50% of which was counseling/coordinating care for perm cath for dialysis  Signed: Jessyca Sloan A 11/29/2014, 3:51 PM

## 2014-11-29 NOTE — Progress Notes (Signed)
   VASCULAR SURGERY ASSESSMENT & PLAN:  * Pending vein mapping and arterial Doppler study, I will tentatively plan on placement of a left arteriovenous fistula or arteriovenous graft tomorrow if the patient is felt to be medically stable by Dr. Laren Everts.  SUBJECTIVE: Feels a little stronger today.  PHYSICAL EXAM:  Palpable left radial pulse.  LABS: Lab Results  Component Value Date   WBC 7.2 11/27/2014   HGB 9.0* 11/27/2014   HCT 30.6* 11/27/2014   MCV 101.7* 11/27/2014   PLT 166 11/27/2014   Active Problems:   AKI (acute kidney injury)   Acute on chronic diastolic CHF (congestive heart failure)   Pulmonary edema   [name] Beeper: [id number] 11/29/2014

## 2014-11-30 ENCOUNTER — Other Ambulatory Visit (HOSPITAL_COMMUNITY): Payer: Medicare Other

## 2014-11-30 ENCOUNTER — Encounter (HOSPITAL_COMMUNITY): Payer: Self-pay | Admitting: Certified Registered"

## 2014-11-30 ENCOUNTER — Encounter: Payer: Self-pay | Admitting: Certified Registered Nurse Anesthetist

## 2014-11-30 ENCOUNTER — Encounter
Admission: AD | Disposition: A | Discharge status: Skilled Nursing Facility | Payer: Self-pay | Source: Ambulatory Visit | Attending: Internal Medicine

## 2014-11-30 ENCOUNTER — Other Ambulatory Visit: Payer: Self-pay | Admitting: *Deleted

## 2014-11-30 DIAGNOSIS — J90 Pleural effusion, not elsewhere classified: Secondary | ICD-10-CM | POA: Diagnosis not present

## 2014-11-30 DIAGNOSIS — Z452 Encounter for adjustment and management of vascular access device: Secondary | ICD-10-CM | POA: Diagnosis not present

## 2014-11-30 DIAGNOSIS — N185 Chronic kidney disease, stage 5: Secondary | ICD-10-CM

## 2014-11-30 DIAGNOSIS — N186 End stage renal disease: Secondary | ICD-10-CM

## 2014-11-30 DIAGNOSIS — Z4931 Encounter for adequacy testing for hemodialysis: Secondary | ICD-10-CM

## 2014-11-30 HISTORY — PX: EXCHANGE OF A DIALYSIS CATHETER: SHX5818

## 2014-11-30 HISTORY — PX: AV FISTULA PLACEMENT: SHX1204

## 2014-11-30 LAB — CBC
HEMATOCRIT: 28.9 % — AB (ref 36.0–46.0)
Hemoglobin: 8.6 g/dL — ABNORMAL LOW (ref 12.0–15.0)
MCH: 31.3 pg (ref 26.0–34.0)
MCHC: 29.8 g/dL — ABNORMAL LOW (ref 30.0–36.0)
MCV: 105.1 fL — ABNORMAL HIGH (ref 78.0–100.0)
PLATELETS: 124 10*3/uL — AB (ref 150–400)
RBC: 2.75 MIL/uL — ABNORMAL LOW (ref 3.87–5.11)
RDW: 18.8 % — ABNORMAL HIGH (ref 11.5–15.5)
WBC: 7.9 10*3/uL (ref 4.0–10.5)

## 2014-11-30 LAB — BASIC METABOLIC PANEL WITH GFR
Anion gap: 10 (ref 5–15)
BUN: 21 mg/dL (ref 6–23)
CO2: 28 meq/L (ref 19–32)
Calcium: 8.8 mg/dL (ref 8.4–10.5)
Chloride: 96 meq/L (ref 96–112)
Creatinine, Ser: 2.65 mg/dL — ABNORMAL HIGH (ref 0.50–1.10)
GFR calc Af Amer: 19 mL/min — ABNORMAL LOW
GFR calc non Af Amer: 17 mL/min — ABNORMAL LOW
Glucose, Bld: 115 mg/dL — ABNORMAL HIGH (ref 70–99)
Potassium: 4.1 meq/L (ref 3.7–5.3)
Sodium: 134 meq/L — ABNORMAL LOW (ref 137–147)

## 2014-11-30 SURGERY — ARTERIOVENOUS (AV) FISTULA CREATION
Anesthesia: General | Site: Chest | Laterality: Left

## 2014-11-30 MED ORDER — GLYCOPYRROLATE 0.2 MG/ML IJ SOLN
INTRAMUSCULAR | Status: AC
  Filled 2014-11-30: qty 3

## 2014-11-30 MED ORDER — OXYCODONE HCL 5 MG PO TABS: 5.0000 mg | ORAL_TABLET | Freq: Once | ORAL | Status: DC | PRN

## 2014-11-30 MED ORDER — SODIUM CHLORIDE 0.9 % IV SOLN
500.0000 mg | Freq: Once | INTRAVENOUS | Status: AC
  Administered 2014-11-30: 500 mg via INTRAVENOUS

## 2014-11-30 MED ORDER — HEPARIN SODIUM (PORCINE) 1000 UNIT/ML IJ SOLN
INTRAMUSCULAR | Status: AC
  Filled 2014-11-30: qty 3

## 2014-11-30 MED ORDER — PROPRANOLOL HCL 1 MG/ML IV SOLN
INTRAVENOUS | Status: DC | PRN
  Administered 2014-11-30: .5 mg via INTRAVENOUS
  Administered 2014-11-30: .2 mg via INTRAVENOUS
  Administered 2014-11-30: .3 mg via INTRAVENOUS

## 2014-11-30 MED ORDER — SODIUM CHLORIDE 0.9 % IJ SOLN
INTRAMUSCULAR | Status: AC
  Filled 2014-11-30: qty 10

## 2014-11-30 MED ORDER — HEPARIN SODIUM (PORCINE) 1000 UNIT/ML IJ SOLN
INTRAMUSCULAR | Status: DC | PRN
  Administered 2014-11-30: 1000 [IU]

## 2014-11-30 MED ORDER — LIDOCAINE-EPINEPHRINE (PF) 1 %-1:200000 IJ SOLN
INTRAMUSCULAR | Status: AC
  Filled 2014-11-30: qty 10

## 2014-11-30 MED ORDER — SODIUM CHLORIDE 0.9 % IV SOLN
INTRAVENOUS | Status: DC
  Administered 2014-11-30: 10:00:00 via INTRAVENOUS

## 2014-11-30 MED ORDER — MIDAZOLAM HCL 2 MG/2ML IJ SOLN
INTRAMUSCULAR | Status: AC
  Filled 2014-11-30: qty 2

## 2014-11-30 MED ORDER — ONDANSETRON HCL 4 MG/2ML IJ SOLN
INTRAMUSCULAR | Status: AC
  Filled 2014-11-30: qty 2

## 2014-11-30 MED ORDER — ONDANSETRON HCL 4 MG/2ML IJ SOLN
INTRAMUSCULAR | Status: DC | PRN
  Administered 2014-11-30: 4 mg via INTRAVENOUS

## 2014-11-30 MED ORDER — PROPOFOL 10 MG/ML IV BOLUS
INTRAVENOUS | Status: AC
  Filled 2014-11-30: qty 20

## 2014-11-30 MED ORDER — FENTANYL CITRATE 0.05 MG/ML IJ SOLN
INTRAMUSCULAR | Status: AC
  Filled 2014-11-30: qty 5

## 2014-11-30 MED ORDER — LIDOCAINE HCL (PF) 1 % IJ SOLN
INTRAMUSCULAR | Status: AC
  Filled 2014-11-30: qty 30

## 2014-11-30 MED ORDER — PROTAMINE SULFATE 10 MG/ML IV SOLN
INTRAVENOUS | Status: DC | PRN
  Administered 2014-11-30: 30 mg via INTRAVENOUS

## 2014-11-30 MED ORDER — LIDOCAINE HCL (CARDIAC) 10 MG/ML IV SOLN
INTRAVENOUS | Status: DC | PRN
  Administered 2014-11-30: 100 mg via INTRAVENOUS

## 2014-11-30 MED ORDER — 0.9 % SODIUM CHLORIDE (POUR BTL) OPTIME
TOPICAL | Status: DC | PRN
  Administered 2014-11-30: 1000 mL

## 2014-11-30 MED ORDER — PROMETHAZINE HCL 25 MG/ML IJ SOLN: 6.2500 mg | INTRAMUSCULAR | Status: DC | PRN

## 2014-11-30 MED ORDER — HEPARIN SODIUM (PORCINE) 1000 UNIT/ML IJ SOLN
INTRAMUSCULAR | Status: DC | PRN
  Administered 2014-11-30: 6000 [IU] via INTRAVENOUS

## 2014-11-30 MED ORDER — HEPARIN SODIUM (PORCINE) 1000 UNIT/ML IJ SOLN
INTRAMUSCULAR | Status: AC
  Filled 2014-11-30: qty 1

## 2014-11-30 MED ORDER — PROPRANOLOL HCL 1 MG/ML IV SOLN
INTRAVENOUS | Status: AC
  Filled 2014-11-30: qty 1

## 2014-11-30 MED ORDER — OXYCODONE HCL 5 MG/5ML PO SOLN: 5.0000 mg | Freq: Once | ORAL | Status: DC | PRN

## 2014-11-30 MED ORDER — FENTANYL CITRATE 0.05 MG/ML IJ SOLN: 25.0000 ug | INTRAMUSCULAR | Status: DC | PRN

## 2014-11-30 MED ORDER — ROCURONIUM BROMIDE 50 MG/5ML IV SOLN
INTRAVENOUS | Status: AC
  Filled 2014-11-30: qty 1

## 2014-11-30 MED ORDER — VANCOMYCIN HCL IN DEXTROSE 1-5 GM/200ML-% IV SOLN
INTRAVENOUS | Status: AC
  Filled 2014-11-30: qty 200

## 2014-11-30 MED ORDER — PROPOFOL 10 MG/ML IV BOLUS
INTRAVENOUS | Status: DC | PRN
  Administered 2014-11-30 (×5): 10 mg via INTRAVENOUS

## 2014-11-30 MED ORDER — LIDOCAINE HCL (PF) 1 % IJ SOLN
INTRAMUSCULAR | Status: DC | PRN
Start: 2014-11-30 — End: 2014-11-30
  Administered 2014-11-30: 30 mL

## 2014-11-30 MED ORDER — SODIUM CHLORIDE 0.9 % IR SOLN
Status: DC | PRN
  Administered 2014-11-30: 11:00:00

## 2014-11-30 MED ORDER — LIDOCAINE HCL (CARDIAC) 20 MG/ML IV SOLN
INTRAVENOUS | Status: AC
  Filled 2014-11-30: qty 5

## 2014-11-30 SURGICAL SUPPLY — 51 items
ARMBAND PINK RESTRICT EXTREMIT (MISCELLANEOUS) ×4 IMPLANT
BLADE SURG 10 STRL SS (BLADE) ×6 IMPLANT
CANISTER SUCTION 2500CC (MISCELLANEOUS) ×4 IMPLANT
CANNULA VESSEL 3MM 2 BLNT TIP (CANNULA) ×2 IMPLANT
CATH CANNON HEMO 15FR 32 (HEMODIALYSIS SUPPLIES) IMPLANT
CATH CANNON HEMO 15FR 32CM (HEMODIALYSIS SUPPLIES) ×4 IMPLANT
CLIP TI MEDIUM 6 (CLIP) ×4 IMPLANT
CLIP TI WIDE RED SMALL 6 (CLIP) ×6 IMPLANT
COVER PROBE W GEL 5X96 (DRAPES) ×2 IMPLANT
COVER SURGICAL LIGHT HANDLE (MISCELLANEOUS) ×4 IMPLANT
DECANTER SPIKE VIAL GLASS SM (MISCELLANEOUS) ×4 IMPLANT
DRAIN PENROSE 1/2X12 LTX STRL (WOUND CARE) IMPLANT
DRAPE C-ARM 42X72 X-RAY (DRAPES) ×2 IMPLANT
DRAPE LAPAROSCOPIC ABDOMINAL (DRAPES) ×2 IMPLANT
DRAPE ORTHO SPLIT 77X108 STRL (DRAPES) ×4
DRAPE SURG ORHT 6 SPLT 77X108 (DRAPES) IMPLANT
ELECT REM PT RETURN 9FT ADLT (ELECTROSURGICAL) ×4
ELECTRODE REM PT RTRN 9FT ADLT (ELECTROSURGICAL) ×2 IMPLANT
GAUZE SPONGE 2X2 8PLY STRL LF (GAUZE/BANDAGES/DRESSINGS) IMPLANT
GLOVE BIO SURGEON STRL SZ7.5 (GLOVE) ×6 IMPLANT
GLOVE BIOGEL PI IND STRL 6.5 (GLOVE) IMPLANT
GLOVE BIOGEL PI IND STRL 7.0 (GLOVE) IMPLANT
GLOVE BIOGEL PI IND STRL 8 (GLOVE) ×2 IMPLANT
GLOVE BIOGEL PI INDICATOR 6.5 (GLOVE) ×8
GLOVE BIOGEL PI INDICATOR 7.0 (GLOVE) ×2
GLOVE BIOGEL PI INDICATOR 8 (GLOVE) ×6
GLOVE ECLIPSE 6.5 STRL STRAW (GLOVE) ×6 IMPLANT
GOWN STRL REUS W/ TWL LRG LVL3 (GOWN DISPOSABLE) ×6 IMPLANT
GOWN STRL REUS W/TWL LRG LVL3 (GOWN DISPOSABLE) ×24
KIT BASIN OR (CUSTOM PROCEDURE TRAY) ×4 IMPLANT
KIT ROOM TURNOVER OR (KITS) ×4 IMPLANT
LIQUID BAND (GAUZE/BANDAGES/DRESSINGS) ×6 IMPLANT
NDL 18GX1X1/2 (RX/OR ONLY) (NEEDLE) IMPLANT
NEEDLE 18GX1X1/2 (RX/OR ONLY) (NEEDLE) ×4 IMPLANT
NS IRRIG 1000ML POUR BTL (IV SOLUTION) ×4 IMPLANT
PACK CV ACCESS (CUSTOM PROCEDURE TRAY) ×4 IMPLANT
PAD ARMBOARD 7.5X6 YLW CONV (MISCELLANEOUS) ×8 IMPLANT
PROBE PENCIL 8 MHZ STRL DISP (MISCELLANEOUS) ×2 IMPLANT
SPONGE GAUZE 2X2 STER 10/PKG (GAUZE/BANDAGES/DRESSINGS) ×2
SPONGE SURGIFOAM ABS GEL 100 (HEMOSTASIS) IMPLANT
SUT ETHILON 3 0 PS 1 (SUTURE) ×2 IMPLANT
SUT PROLENE 6 0 BV (SUTURE) ×6 IMPLANT
SUT VIC AB 3-0 SH 27 (SUTURE) ×4
SUT VIC AB 3-0 SH 27X BRD (SUTURE) ×2 IMPLANT
SUT VICRYL 4-0 PS2 18IN ABS (SUTURE) ×6 IMPLANT
SYR 20CC LL (SYRINGE) ×2 IMPLANT
SYRINGE 10CC LL (SYRINGE) ×2 IMPLANT
TAPE CLOTH SURG 4X10 WHT LF (GAUZE/BANDAGES/DRESSINGS) ×2 IMPLANT
TOWEL OR 17X24 6PK STRL BLUE (TOWEL DISPOSABLE) ×2 IMPLANT
UNDERPAD 30X30 INCONTINENT (UNDERPADS AND DIAPERS) ×4 IMPLANT
WATER STERILE IRR 1000ML POUR (IV SOLUTION) ×4 IMPLANT

## 2014-11-30 NOTE — Interval H&P Note (Signed)
History and Physical Interval Note:  11/30/2014 10:04 AM  [name] James  has presented today for surgery, with the diagnosis of chronic kidney disease  The various methods of treatment have been discussed with the patient and family. After consideration of risks, benefits and other options for treatment, the patient has consented to  Procedure(s): ARTERIOVENOUS (AV) FISTULA CREATION VS ARTERIOVENOUS GRAFT INSERTION  (Left) as a surgical intervention .  The patient's history has been reviewed, patient examined, no change in status, stable for surgery.  I have reviewed the patient's chart and labs.  Questions were answered to the patient's satisfaction.     Alexandro Line S

## 2014-11-30 NOTE — Anesthesia Postprocedure Evaluation (Signed)
  Anesthesia Post-op Note  Patient: [name] James  Procedure(s) Performed: Procedure(s): ARTERIOVENOUS (AV) FISTULA CREATION LEFT ARM (Left) EXCHANGE OF A DIALYSIS CATHETER, LEFT INTERNAL JUGULAR (Left)  Patient Location: PACU  Anesthesia Type:MAC  Level of Consciousness: awake, confused and lethargic  Airway and Oxygen Therapy: Patient Spontanous Breathing  Post-op Pain: none  Post-op Assessment: Post-op Vital signs reviewed  Post-op Vital Signs: stable  Last Vitals:  Filed Vitals:   11/30/14 1400  BP: 150/52  Pulse: 81  Temp:   Resp: 17    Complications: No apparent anesthesia complications

## 2014-11-30 NOTE — Transfer of Care (Signed)
Immediate Anesthesia Transfer of Care Note  Patient: [name] James  Procedure(s) Performed: Procedure(s): ARTERIOVENOUS (AV) FISTULA CREATION LEFT ARM (Left) EXCHANGE OF A DIALYSIS CATHETER, LEFT INTERNAL JUGULAR (Left)  Patient Location: PACU  Anesthesia Type:MAC  Level of Consciousness: oriented and patient cooperative  Airway & Oxygen Therapy: Patient Spontanous Breathing and Patient connected to face mask oxygen  Post-op Assessment: Report given to PACU RN and Post -op Vital signs reviewed and stable  Post vital signs: Reviewed and stable  Complications: No apparent anesthesia complications

## 2014-11-30 NOTE — Op Note (Signed)
    NAME: [name] James   MRN: [id number] DOB: 05-06-1936    DATE OF OPERATION: 11/30/2014  PREOP DIAGNOSIS: Stage V chronic kidney disease  POSTOP DIAGNOSIS: Same  PROCEDURE:  1. New left radial cephalic AV fistula 2. New left IJ 27 cm tunneled dialysis catheter  SURGEON: Judeth Cornfield. Scot Dock, MD, FACS  ASSIST: Silva Bandy, [facility]  ANESTHESIA: local with sedation   EBL: minimal  INDICATIONS: [name] James is a 78 y.o. female who presents for hemodialysis access.  FINDINGS: 3.0 mm cephalic vein  TECHNIQUE: The patient was taken to the operating room and sedated by anesthesia. The left upper extremity was prepped and draped in usual sterile fashion. After the skin was anesthetized with 1% lidocaine, an oblique incision was made at the left wrist and here the cephalic vein was dissected free. It was ligated distally and irrigated up nicely with heparinized saline. The radial artery was dissected free beneath the fascia. The patient was heparinized. The radial artery was clamped proximally and distally and a longitudinal arteriotomy was made. The vein was spatulated and sewn end to side to the artery using continuous 6-0 Prolene suture. At the completion there was a palpable thrill in the fistula. There was good radial flow with the Doppler. The heparin was partially reversed with protamine. The wound was closed with interrupted 3-0 Vicryl's and the skin closed with 4-0 subcuticular stitch. Dermabond was applied.  Attention was then turned to placement of a tunneled dialysis catheter. The left neck and upper chest and the old non-cuffed catheter was prepped and the field. The sutures were cut and the old catheter retracted slightly. The catheter was clamped and divided. The guidewire was advanced through the catheter down into the right atrium and the old catheter was removed. The dilator and peel-away sheath were advanced over the wire and the dilator removed. The new 27 cm cuffed dialysis  catheter was threaded over the wire through the peel-away sheath down into the right atrium and then the wire and peel-away sheath were removed. Exit site of the cath was selected and the skin anesthetized between the 2 areas. Catheters and brought to the tunnel cut the appropriate length and the distal ports were attached. Both ports withdrew easily when flushed with heparin saline and filled with concentrated heparin. The catheter was secured at its exit site with a 3-0 nylon suture. The IJ cannulation site was closed with a 4-0 subcuticular stitch. Dermabond was applied. The patient tolerated the procedure well and was transferred to the recovery when stable condition. All needle and sponge counts were correct.  [name], MD, FACS [facility]  DATE OF DICTATION:   11/30/2014

## 2014-11-30 NOTE — Anesthesia Preprocedure Evaluation (Signed)
Anesthesia Evaluation  Patient identified by MRN, date of birth, ID band Patient awake    Reviewed: Allergy & Precautions, H&P , NPO status , Patient's Chart, lab work & pertinent test results  Airway Mallampati: III       Dental  (+) Teeth Intact   Pulmonary shortness of breath and at rest,  + rhonchi   + decreased breath sounds      Cardiovascular hypertension, +CHF Rhythm:Regular     Neuro/Psych  Neuromuscular disease    GI/Hepatic GERD-  ,  Endo/Other  diabetesMorbid obesity  Renal/GU Renal disease     Musculoskeletal  (+) Arthritis -,   Abdominal (+) + obese,   Peds  Hematology   Anesthesia Other Findings   Reproductive/Obstetrics                             Anesthesia Physical Anesthesia Plan  ASA: IV  Anesthesia Plan: General   Post-op Pain Management:    Induction: Intravenous  Airway Management Planned: Oral ETT  Additional Equipment:   Intra-op Plan:   Post-operative Plan: Possible Post-op intubation/ventilation  Informed Consent: I have reviewed the patients History and Physical, chart, labs and discussed the procedure including the risks, benefits and alternatives for the proposed anesthesia with the patient or authorized representative who has indicated his/her understanding and acceptance.     Plan Discussed with: CRNA and Surgeon  Anesthesia Plan Comments: (Discussed c daughter possible ventilation postop)        Anesthesia Quick Evaluation

## 2014-11-30 NOTE — H&P (View-Only) (Signed)
   VASCULAR SURGERY ASSESSMENT & PLAN:  * Pending vein mapping and arterial Doppler study, I will tentatively plan on placement of a left arteriovenous fistula or arteriovenous graft tomorrow if the patient is felt to be medically stable by Dr. Laren James.  SUBJECTIVE: Feels a little stronger today.  PHYSICAL EXAM:  Palpable left radial pulse.  LABS: Lab Results  Component Value Date   WBC 7.2 11/27/2014   HGB 9.0* 11/27/2014   HCT 30.6* 11/27/2014   MCV 101.7* 11/27/2014   PLT 166 11/27/2014   Active Problems:   AKI (acute kidney injury)   Acute on chronic diastolic CHF (congestive heart failure)   Pulmonary edema   [name] James Beeper: [id number] 11/29/2014

## 2014-12-01 ENCOUNTER — Encounter (HOSPITAL_COMMUNITY): Payer: Self-pay | Admitting: Vascular Surgery

## 2014-12-01 LAB — RENAL FUNCTION PANEL
ANION GAP: 13 (ref 5–15)
Albumin: 2.7 g/dL — ABNORMAL LOW (ref 3.5–5.2)
BUN: 36 mg/dL — AB (ref 6–23)
CO2: 25 meq/L (ref 19–32)
CREATININE: 3.65 mg/dL — AB (ref 0.50–1.10)
Calcium: 8.7 mg/dL (ref 8.4–10.5)
Chloride: 98 mEq/L (ref 96–112)
GFR calc Af Amer: 13 mL/min — ABNORMAL LOW (ref >90)
GFR calc non Af Amer: 11 mL/min — ABNORMAL LOW (ref >90)
Glucose, Bld: 199 mg/dL — ABNORMAL HIGH (ref 70–99)
POTASSIUM: 4.6 meq/L (ref 3.7–5.3)
Phosphorus: 2.7 mg/dL (ref 2.3–4.6)
Sodium: 136 mEq/L — ABNORMAL LOW (ref 137–147)

## 2014-12-01 LAB — CBC
HEMATOCRIT: 29.9 % — AB (ref 36.0–46.0)
Hemoglobin: 8.8 g/dL — ABNORMAL LOW (ref 12.0–15.0)
MCH: 29.8 pg (ref 26.0–34.0)
MCHC: 29.4 g/dL — ABNORMAL LOW (ref 30.0–36.0)
MCV: 101.4 fL — AB (ref 78.0–100.0)
Platelets: 127 10*3/uL — ABNORMAL LOW (ref 150–400)
RBC: 2.95 MIL/uL — ABNORMAL LOW (ref 3.87–5.11)
RDW: 18.3 % — ABNORMAL HIGH (ref 11.5–15.5)
WBC: 7.9 10*3/uL (ref 4.0–10.5)

## 2014-12-01 NOTE — Progress Notes (Signed)
   VASCULAR SURGERY ASSESSMENT & PLAN:  * 1 Day Post-Op s/p: left radiocephalic AV fistula and placement of tunneled dialysis catheter  *  Vascular surgery will be available as needed. I have arranged a follow up visit in 6 weeks.  SUBJECTIVE: no complaints  PHYSICAL EXAM: Filed Vitals:   11/30/14 1345 11/30/14 1400 11/30/14 1415 11/30/14 1430  BP: 140/51 150/52 143/72 141/72  Pulse: 75 81  77  Temp:    98.4 F (36.9 C)  TempSrc:      Resp: 20 17  15   SpO2: 98% 100%  98%   Her left radial cephalic AV fistula has an excellent thrill.  The incision looks fine.  Active Problems:   AKI (acute kidney injury)   Acute on chronic diastolic CHF (congestive heart failure)   Pulmonary edema   Acute renal failure syndrome   [name] Beeper: [id number] 12/01/2014

## 2014-12-03 LAB — TSH: TSH: 0.534 u[IU]/mL (ref 0.350–4.500)

## 2014-12-04 ENCOUNTER — Telehealth: Payer: Self-pay | Admitting: Vascular Surgery

## 2014-12-04 LAB — CBC
HCT: 29.9 % — ABNORMAL LOW (ref 36.0–46.0)
Hemoglobin: 9 g/dL — ABNORMAL LOW (ref 12.0–15.0)
MCH: 29.6 pg (ref 26.0–34.0)
MCHC: 30.1 g/dL (ref 30.0–36.0)
MCV: 98.4 fL (ref 78.0–100.0)
PLATELETS: 154 10*3/uL (ref 150–400)
RBC: 3.04 MIL/uL — AB (ref 3.87–5.11)
RDW: 17.9 % — AB (ref 11.5–15.5)
WBC: 8.3 10*3/uL (ref 4.0–10.5)

## 2014-12-04 LAB — RENAL FUNCTION PANEL
ALBUMIN: 2.8 g/dL — AB (ref 3.5–5.2)
Anion gap: 13 (ref 5–15)
BUN: 53 mg/dL — ABNORMAL HIGH (ref 6–23)
CALCIUM: 8.9 mg/dL (ref 8.4–10.5)
CO2: 24 meq/L (ref 19–32)
Chloride: 93 mEq/L — ABNORMAL LOW (ref 96–112)
Creatinine, Ser: 5.13 mg/dL — ABNORMAL HIGH (ref 0.50–1.10)
GFR, EST AFRICAN AMERICAN: 9 mL/min — AB (ref >90)
GFR, EST NON AFRICAN AMERICAN: 7 mL/min — AB (ref >90)
Glucose, Bld: 122 mg/dL — ABNORMAL HIGH (ref 70–99)
PHOSPHORUS: 1.7 mg/dL — AB (ref 2.3–4.6)
Potassium: 4.3 mEq/L (ref 3.7–5.3)
SODIUM: 130 meq/L — AB (ref 137–147)

## 2014-12-04 NOTE — Telephone Encounter (Addendum)
-----   Message from Mena Goes, RN sent at 11/30/2014 12:59 PM EST ----- Regarding: Schedule   ----- Message -----    From: Alvia Grove, PA-C    Sent: 11/30/2014  12:06 PM      To: Vvs Charge Pool  S/p TDC and left radial cephalic AVF [id number]  F/u with Dr. Scot Dock in 6 weeks with duplex.  Thanks,  Kim  12/04/14: lm for pt re appt, dpm

## 2014-12-06 DIAGNOSIS — E1165 Type 2 diabetes mellitus with hyperglycemia: Secondary | ICD-10-CM | POA: Diagnosis present

## 2014-12-06 DIAGNOSIS — I10 Essential (primary) hypertension: Secondary | ICD-10-CM | POA: Diagnosis not present

## 2014-12-06 DIAGNOSIS — I129 Hypertensive chronic kidney disease with stage 1 through stage 4 chronic kidney disease, or unspecified chronic kidney disease: Secondary | ICD-10-CM | POA: Diagnosis not present

## 2014-12-06 DIAGNOSIS — Z794 Long term (current) use of insulin: Secondary | ICD-10-CM | POA: Diagnosis not present

## 2014-12-06 DIAGNOSIS — R079 Chest pain, unspecified: Secondary | ICD-10-CM | POA: Diagnosis present

## 2014-12-06 DIAGNOSIS — I150 Renovascular hypertension: Secondary | ICD-10-CM | POA: Diagnosis not present

## 2014-12-06 DIAGNOSIS — R2681 Unsteadiness on feet: Secondary | ICD-10-CM | POA: Diagnosis not present

## 2014-12-06 DIAGNOSIS — R278 Other lack of coordination: Secondary | ICD-10-CM | POA: Diagnosis not present

## 2014-12-06 DIAGNOSIS — I953 Hypotension of hemodialysis: Secondary | ICD-10-CM | POA: Diagnosis present

## 2014-12-06 DIAGNOSIS — D509 Iron deficiency anemia, unspecified: Secondary | ICD-10-CM | POA: Diagnosis present

## 2014-12-06 DIAGNOSIS — J984 Other disorders of lung: Secondary | ICD-10-CM | POA: Diagnosis not present

## 2014-12-06 DIAGNOSIS — N179 Acute kidney failure, unspecified: Secondary | ICD-10-CM | POA: Diagnosis not present

## 2014-12-06 DIAGNOSIS — R404 Transient alteration of awareness: Secondary | ICD-10-CM | POA: Diagnosis not present

## 2014-12-06 DIAGNOSIS — R002 Palpitations: Secondary | ICD-10-CM | POA: Diagnosis present

## 2014-12-06 DIAGNOSIS — F329 Major depressive disorder, single episode, unspecified: Secondary | ICD-10-CM | POA: Diagnosis present

## 2014-12-06 DIAGNOSIS — K219 Gastro-esophageal reflux disease without esophagitis: Secondary | ICD-10-CM | POA: Diagnosis present

## 2014-12-06 DIAGNOSIS — Z6835 Body mass index (BMI) 35.0-35.9, adult: Secondary | ICD-10-CM | POA: Diagnosis not present

## 2014-12-06 DIAGNOSIS — D631 Anemia in chronic kidney disease: Secondary | ICD-10-CM | POA: Diagnosis not present

## 2014-12-06 DIAGNOSIS — M6281 Muscle weakness (generalized): Secondary | ICD-10-CM | POA: Diagnosis not present

## 2014-12-06 DIAGNOSIS — J9601 Acute respiratory failure with hypoxia: Secondary | ICD-10-CM | POA: Diagnosis not present

## 2014-12-06 DIAGNOSIS — I4891 Unspecified atrial fibrillation: Secondary | ICD-10-CM | POA: Diagnosis present

## 2014-12-06 DIAGNOSIS — I739 Peripheral vascular disease, unspecified: Secondary | ICD-10-CM | POA: Diagnosis present

## 2014-12-06 DIAGNOSIS — Z992 Dependence on renal dialysis: Secondary | ICD-10-CM | POA: Diagnosis not present

## 2014-12-06 DIAGNOSIS — Z7901 Long term (current) use of anticoagulants: Secondary | ICD-10-CM | POA: Diagnosis not present

## 2014-12-06 DIAGNOSIS — N186 End stage renal disease: Secondary | ICD-10-CM | POA: Diagnosis present

## 2014-12-06 DIAGNOSIS — R531 Weakness: Secondary | ICD-10-CM | POA: Diagnosis not present

## 2014-12-06 DIAGNOSIS — Z880 Allergy status to penicillin: Secondary | ICD-10-CM | POA: Diagnosis not present

## 2014-12-06 DIAGNOSIS — E119 Type 2 diabetes mellitus without complications: Secondary | ICD-10-CM | POA: Diagnosis not present

## 2014-12-06 DIAGNOSIS — R0602 Shortness of breath: Secondary | ICD-10-CM | POA: Diagnosis not present

## 2014-12-06 DIAGNOSIS — E43 Unspecified severe protein-calorie malnutrition: Secondary | ICD-10-CM | POA: Diagnosis not present

## 2014-12-06 DIAGNOSIS — I120 Hypertensive chronic kidney disease with stage 5 chronic kidney disease or end stage renal disease: Secondary | ICD-10-CM | POA: Diagnosis present

## 2014-12-06 DIAGNOSIS — N2581 Secondary hyperparathyroidism of renal origin: Secondary | ICD-10-CM | POA: Diagnosis present

## 2014-12-06 DIAGNOSIS — E876 Hypokalemia: Secondary | ICD-10-CM | POA: Diagnosis present

## 2014-12-06 DIAGNOSIS — Z885 Allergy status to narcotic agent status: Secondary | ICD-10-CM | POA: Diagnosis not present

## 2014-12-06 DIAGNOSIS — Z79891 Long term (current) use of opiate analgesic: Secondary | ICD-10-CM | POA: Diagnosis not present

## 2014-12-06 DIAGNOSIS — Z66 Do not resuscitate: Secondary | ICD-10-CM | POA: Diagnosis present

## 2014-12-06 DIAGNOSIS — H269 Unspecified cataract: Secondary | ICD-10-CM | POA: Diagnosis present

## 2014-12-06 DIAGNOSIS — D638 Anemia in other chronic diseases classified elsewhere: Secondary | ICD-10-CM | POA: Diagnosis not present

## 2014-12-06 DIAGNOSIS — E1122 Type 2 diabetes mellitus with diabetic chronic kidney disease: Secondary | ICD-10-CM | POA: Diagnosis present

## 2014-12-06 DIAGNOSIS — M109 Gout, unspecified: Secondary | ICD-10-CM | POA: Diagnosis present

## 2014-12-06 DIAGNOSIS — E785 Hyperlipidemia, unspecified: Secondary | ICD-10-CM | POA: Diagnosis present

## 2014-12-06 DIAGNOSIS — F064 Anxiety disorder due to known physiological condition: Secondary | ICD-10-CM | POA: Diagnosis present

## 2014-12-06 DIAGNOSIS — G629 Polyneuropathy, unspecified: Secondary | ICD-10-CM | POA: Diagnosis not present

## 2014-12-06 DIAGNOSIS — M1990 Unspecified osteoarthritis, unspecified site: Secondary | ICD-10-CM | POA: Diagnosis present

## 2014-12-06 DIAGNOSIS — I5033 Acute on chronic diastolic (congestive) heart failure: Secondary | ICD-10-CM | POA: Diagnosis not present

## 2014-12-06 DIAGNOSIS — Z853 Personal history of malignant neoplasm of breast: Secondary | ICD-10-CM | POA: Diagnosis not present

## 2014-12-06 DIAGNOSIS — Z881 Allergy status to other antibiotic agents status: Secondary | ICD-10-CM | POA: Diagnosis not present

## 2014-12-06 DIAGNOSIS — I5032 Chronic diastolic (congestive) heart failure: Secondary | ICD-10-CM | POA: Diagnosis present

## 2014-12-06 DIAGNOSIS — E46 Unspecified protein-calorie malnutrition: Secondary | ICD-10-CM | POA: Diagnosis not present

## 2014-12-06 DIAGNOSIS — R5381 Other malaise: Secondary | ICD-10-CM | POA: Diagnosis not present

## 2014-12-06 DIAGNOSIS — J9600 Acute respiratory failure, unspecified whether with hypoxia or hypercapnia: Secondary | ICD-10-CM | POA: Diagnosis not present

## 2014-12-06 DIAGNOSIS — F419 Anxiety disorder, unspecified: Secondary | ICD-10-CM | POA: Diagnosis present

## 2014-12-06 DIAGNOSIS — Z833 Family history of diabetes mellitus: Secondary | ICD-10-CM | POA: Diagnosis not present

## 2014-12-06 DIAGNOSIS — Z79899 Other long term (current) drug therapy: Secondary | ICD-10-CM | POA: Diagnosis not present

## 2014-12-06 DIAGNOSIS — K921 Melena: Secondary | ICD-10-CM | POA: Diagnosis present

## 2014-12-06 DIAGNOSIS — K2970 Gastritis, unspecified, without bleeding: Secondary | ICD-10-CM | POA: Diagnosis not present

## 2014-12-06 DIAGNOSIS — E1129 Type 2 diabetes mellitus with other diabetic kidney complication: Secondary | ICD-10-CM | POA: Diagnosis not present

## 2014-12-06 LAB — CBC
HEMATOCRIT: 29.9 % — AB (ref 36.0–46.0)
HEMOGLOBIN: 9.2 g/dL — AB (ref 12.0–15.0)
MCH: 30.7 pg (ref 26.0–34.0)
MCHC: 30.8 g/dL (ref 30.0–36.0)
MCV: 99.7 fL (ref 78.0–100.0)
Platelets: 214 10*3/uL (ref 150–400)
RBC: 3 MIL/uL — ABNORMAL LOW (ref 3.87–5.11)
RDW: 17.7 % — ABNORMAL HIGH (ref 11.5–15.5)
WBC: 8.6 10*3/uL (ref 4.0–10.5)

## 2014-12-06 LAB — RENAL FUNCTION PANEL
Albumin: 2.7 g/dL — ABNORMAL LOW (ref 3.5–5.2)
Anion gap: 15 (ref 5–15)
BUN: 41 mg/dL — AB (ref 6–23)
CO2: 24 mEq/L (ref 19–32)
CREATININE: 4.19 mg/dL — AB (ref 0.50–1.10)
Calcium: 8.7 mg/dL (ref 8.4–10.5)
Chloride: 92 mEq/L — ABNORMAL LOW (ref 96–112)
GFR calc Af Amer: 11 mL/min — ABNORMAL LOW (ref >90)
GFR calc non Af Amer: 9 mL/min — ABNORMAL LOW (ref >90)
GLUCOSE: 118 mg/dL — AB (ref 70–99)
PHOSPHORUS: 1.9 mg/dL — AB (ref 2.3–4.6)
POTASSIUM: 3.9 meq/L (ref 3.7–5.3)
Sodium: 131 mEq/L — ABNORMAL LOW (ref 137–147)

## 2014-12-07 ENCOUNTER — Non-Acute Institutional Stay (SKILLED_NURSING_FACILITY): Payer: Medicare Other | Admitting: Adult Health

## 2014-12-07 ENCOUNTER — Encounter: Payer: Self-pay | Admitting: Adult Health

## 2014-12-07 ENCOUNTER — Other Ambulatory Visit: Payer: Self-pay | Admitting: *Deleted

## 2014-12-07 DIAGNOSIS — I150 Renovascular hypertension: Secondary | ICD-10-CM

## 2014-12-07 DIAGNOSIS — D638 Anemia in other chronic diseases classified elsewhere: Secondary | ICD-10-CM | POA: Diagnosis not present

## 2014-12-07 DIAGNOSIS — I953 Hypotension of hemodialysis: Secondary | ICD-10-CM

## 2014-12-07 DIAGNOSIS — M159 Polyosteoarthritis, unspecified: Secondary | ICD-10-CM

## 2014-12-07 DIAGNOSIS — I5032 Chronic diastolic (congestive) heart failure: Secondary | ICD-10-CM | POA: Diagnosis not present

## 2014-12-07 DIAGNOSIS — G629 Polyneuropathy, unspecified: Secondary | ICD-10-CM

## 2014-12-07 DIAGNOSIS — F329 Major depressive disorder, single episode, unspecified: Secondary | ICD-10-CM

## 2014-12-07 DIAGNOSIS — N179 Acute kidney failure, unspecified: Secondary | ICD-10-CM

## 2014-12-07 DIAGNOSIS — K5900 Constipation, unspecified: Secondary | ICD-10-CM

## 2014-12-07 DIAGNOSIS — E46 Unspecified protein-calorie malnutrition: Secondary | ICD-10-CM | POA: Diagnosis not present

## 2014-12-07 DIAGNOSIS — F32A Depression, unspecified: Secondary | ICD-10-CM

## 2014-12-07 DIAGNOSIS — IMO0002 Reserved for concepts with insufficient information to code with codable children: Secondary | ICD-10-CM

## 2014-12-07 DIAGNOSIS — G4700 Insomnia, unspecified: Secondary | ICD-10-CM

## 2014-12-07 DIAGNOSIS — E1129 Type 2 diabetes mellitus with other diabetic kidney complication: Secondary | ICD-10-CM | POA: Diagnosis not present

## 2014-12-07 DIAGNOSIS — R5381 Other malaise: Secondary | ICD-10-CM | POA: Diagnosis not present

## 2014-12-07 DIAGNOSIS — K219 Gastro-esophageal reflux disease without esophagitis: Secondary | ICD-10-CM

## 2014-12-07 DIAGNOSIS — I495 Sick sinus syndrome: Secondary | ICD-10-CM

## 2014-12-07 DIAGNOSIS — E1165 Type 2 diabetes mellitus with hyperglycemia: Secondary | ICD-10-CM

## 2014-12-07 DIAGNOSIS — F419 Anxiety disorder, unspecified: Secondary | ICD-10-CM

## 2014-12-07 MED ORDER — DIAZEPAM 5 MG PO TABS: ORAL_TABLET | ORAL | Status: DC

## 2014-12-07 NOTE — Telephone Encounter (Signed)
[facility] 

## 2014-12-07 NOTE — Progress Notes (Signed)
Patient ID: [name] James, female   DOB: 01/25/1936, 78 y.o.   MRN: [id number]   12/07/2014  Facility:  Nursing Home Location:  [place] Room Number: 102-P LEVEL OF CARE:  SNF (31)   Chief Complaint  Patient presents with  . Hospitalization Follow-up    Acute on chronic renal failure S/P radiocephalic AV fistula, generalized weakness, protein calorie malnutrition, diastolic CHF, hypertension, diabetes mellitus, anemia, peripheral neuropathy, tachybradycardia syndrome, osteoarthritis, anxiety, insomnia, hypotension, GERD and constipation    HISTORY OF PRESENT ILLNESS:  This is a 78 year old female who has been admitted to [facility] on 12/06/14 from [facility]. She was admitted to [facility] from [facility] with worsening renal function and acute on chronic diastolic CHF. Lasix was given and was diuresed around 14 L. She had esophagastroduodenoscopy for anemia which showed reflux esophagitis, gastritis and duodenitis. She was continued on proton pump inhibitor. Statins were discontinued due to elevated liver functions. Creatinine worsened and nephrology decided to start hemodialysis. Metoprolol started due to tachybradycardia syndrome. She had transfusion of several units packed RBCs. On 11/30/2014, the patient had a left radiocephalic arteriovenous fistula placed. She has been admitted to [facility] for a short-term rehabilitation.  REASSESSMENT OF ONGOING PROBLEMS:  ANEMIA: The anemia has been stable. The patient denies fatigue, melena or hematochezia. No complications from the medications currently being used. 12/16 hemoglobin 9.2  DM:pt's DM remains stable.  Pt denies polyuria, polydipsia, polyphagia, changes in vision or hypoglycemic episodes.  No complications noted from the medication presently being used.    11/15 hemoglobin A1c is: 6.1  ANXIETY: The anxiety remains stable. Patient denies ongoing anxiety or  irritability. No complications reported from the medications currently being used.  PAST MEDICAL HISTORY:  Past Medical History  Diagnosis Date  . CHF (congestive heart failure) 05/13/2011    echo - [id number]; stage 1 diastolic dysfunction; elevated LV filling pressure, MAC  . Chest pain, atypical 12/05/2008    R/Lmv- normal perfusion all regions, noe ECG changes   . Renal insufficiency 10/30/2005    doppler - abn resistance consistent w/ parenchymal disease  . Claudication 10/30/2005    doppler - normal evaluation, no evidence of aneurysm, diameter reduction, dissection, compression or vascular abnormality)  . DM (diabetes mellitus)   . Peripheral neuropathy   . Hypertension   . Edema   . Gout   . Anxiety   . Arthritis   . Cataract   . GERD (gastroesophageal reflux disease)   . Breast cancer 1980s    bil mastectomies, no radiation or chemo    CURRENT MEDICATIONS: Reviewed per MAR/see medication list  Allergies  Allergen Reactions  . Erythromycin Swelling  . Penicillins     She has taken keflex many times without porblems  . Codeine Rash     REVIEW OF SYSTEMS:  GENERAL: no fatigue, no fever, chills RESPIRATORY: no cough, DOE, wheezing, hemoptysis CARDIAC: no chest pain,or palpitations GI: no abdominal pain, diarrhea, constipation, heart burn, nausea or vomiting  PHYSICAL EXAMINATION  GENERAL: no acute distress, obese EYES: conjunctivae normal, sclerae normal, normal eye lids NECK: supple, trachea midline, no neck masses, no thyroid tenderness, no thyromegaly LYMPHATICS: no LAN in the neck, no supraclavicular LAN RESPIRATORY: breathing is even & unlabored, BS CTAB CARDIAC: RRR, no murmur,no extra heart sounds, BLE edema 2+,+ internal jugular temporary catheter on left chest GI: abdomen soft, normal BS, no masses, no tenderness, no hepatomegaly, no splenomegaly EXTREMITIES: Able to move  all 4 extremities; has left radiocephalic AV fistula + bruit/thrill PSYCHIATRIC: the  patient is alert & oriented to person, affect & behavior appropriate  LABS/RADIOLOGY: Labs reviewed: Basic Metabolic Panel:  Recent Labs  11/10/14 0700  11/13/14 0500  11/20/14 0500  12/01/14 1259 12/04/14 0500 12/06/14 0500  NA 142  < > 142  < > 136*  < > 136* 130* 131*  K 5.7*  < > 4.5  < > 3.7  < > 4.6 4.3 3.9  CL 102  < > 101  < > 97  < > 98 93* 92*  CO2 25  < > 21  < > 27  < > 25 24 24   GLUCOSE 178*  < > 215*  < > 162*  < > 199* 122* 118*  BUN 136*  < > 131*  < > 26*  < > 36* 53* 41*  CREATININE 3.41*  < > 3.73*  < > 2.94*  < > 3.65* 5.13* 4.19*  CALCIUM 9.7  < > 8.6  8.3*  < > 8.3*  < > 8.7 8.9 8.7  MG 2.5  --  2.3  --  1.9  --   --   --   --   PHOS 5.7*  --  5.6*  < > 4.8*  < > 2.7 1.7* 1.9*  < > = values in this interval not displayed. Liver Function Tests: CBC:  Recent Labs  11/12/14 0500 11/13/14 0500  11/20/14 0500  12/01/14 1258 12/04/14 0500 12/06/14 0500  WBC 9.3 9.3  < > 8.6  < > 7.9 8.3 8.6  NEUTROABS 7.4 7.0  --  5.7  --   --   --   --   HGB 7.3* 8.3*  < > 9.1*  < > 8.8* 9.0* 9.2*  HCT 24.4* 26.2*  < > 29.9*  < > 29.9* 29.9* 29.9*  MCV 100.8* 98.5  < > 101.4*  < > 101.4* 98.4 99.7  PLT 152 158  < > 177  < > 127* 154 214  < > = values in this interval not displayed.  Cardiac Enzymes:  Recent Labs  10/17/14 2256 10/18/14 0012 10/18/14 0727 11/25/14 0610  CKTOTAL  --   --   --  11  TROPONINI <0.30 <0.30 <0.30  --    CBG:  Recent Labs  11/01/14 2234 11/02/14 0803 11/02/14 1209  GLUCAP 196* 170* 224*    Ir Fluoro Guide Cv Line Left  11/12/2014   CLINICAL DATA:  Renal failure requiring emergent hemodialysis.  EXAM: NON-TUNNELED CENTRAL VENOUS CATHETER PLACEMENT WITH ULTRASOUND AND FLUOROSCOPIC GUIDANCE  FLUOROSCOPY TIME:  12 seconds.  PROCEDURE: The procedure, risks, benefits, and alternatives were explained to the patient's daughter. Questions regarding the procedure were encouraged and answered. The patient's daughter understands and  consents to the procedure.  The left neck and chest were prepped with chlorhexidine in a sterile fashion, and a sterile drape was applied covering the operative field. Maximum barrier sterile technique with sterile gowns and gloves were used for the procedure. Local anesthesia was provided with 1% lidocaine. A time-out procedure was performed.  After creating a small venotomy incision, a 19 gauge needle was advanced into the left internal jugular vein under direct, real-time ultrasound guidance. Ultrasound image documentation was performed. Venous access was dilated over a guidewire. A 24 cm, 13 French Trialysis catheter was placed over the wire.  Final catheter positioning was confirmed and documented with a fluoroscopic spot image. The catheter was aspirated, flushed  with saline, and injected with appropriate volume heparin dwells. The catheter exit site was secured with 0-Prolene retention sutures.  COMPLICATIONS: None.  No pneumothorax.  FINDINGS: After catheter placement, the tip lies in the right atrium. The catheter aspirates normally and is ready for immediate use.  IMPRESSION: Placement of non-tunneled central venous catheter via the left internal jugular vein. The catheter tip lies in the right atrium. The catheter is ready for immediate use.   Electronically Signed   By: Aletta Edouard M.D.   On: 11/12/2014 12:13   Ir US Guide Vasc Access Left  11/12/2014   CLINICAL DATA:  Renal failure requiring emergent hemodialysis.  EXAM: NON-TUNNELED CENTRAL VENOUS CATHETER PLACEMENT WITH ULTRASOUND AND FLUOROSCOPIC GUIDANCE  FLUOROSCOPY TIME:  12 seconds.  PROCEDURE: The procedure, risks, benefits, and alternatives were explained to the patient's daughter. Questions regarding the procedure were encouraged and answered. The patient's daughter understands and consents to the procedure.  The left neck and chest were prepped with chlorhexidine in a sterile fashion, and a sterile drape was applied covering the  operative field. Maximum barrier sterile technique with sterile gowns and gloves were used for the procedure. Local anesthesia was provided with 1% lidocaine. A time-out procedure was performed.  After creating a small venotomy incision, a 19 gauge needle was advanced into the left internal jugular vein under direct, real-time ultrasound guidance. Ultrasound image documentation was performed. Venous access was dilated over a guidewire. A 24 cm, 13 French Trialysis catheter was placed over the wire.  Final catheter positioning was confirmed and documented with a fluoroscopic spot image. The catheter was aspirated, flushed with saline, and injected with appropriate volume heparin dwells. The catheter exit site was secured with 0-Prolene retention sutures.  COMPLICATIONS: None.  No pneumothorax.  FINDINGS: After catheter placement, the tip lies in the right atrium. The catheter aspirates normally and is ready for immediate use.  IMPRESSION: Placement of non-tunneled central venous catheter via the left internal jugular vein. The catheter tip lies in the right atrium. The catheter is ready for immediate use.   Electronically Signed   By: Aletta Edouard M.D.   On: 11/12/2014 12:13   Dg Chest Port 1 View  11/30/2014   CLINICAL DATA:  Peripherally inserted central catheter. Central line placement.  EXAM: PORTABLE CHEST - 1 VIEW  COMPARISON:  11/20/2014.  FINDINGS: Placement of a split dialysis catheter via LEFT IJ approach. Distal tip terminates in the RIGHT atrium. RIGHT IJ central line is unchanged. Persistent volume overload with borderline heart size, bilateral effusions and basilar predominant airspace disease compatible with edema.  IMPRESSION: Placement of LEFT IJ PermCath with other support apparatus stable. Unchanged volume overload/ CHF.   Electronically Signed   By: Dereck Ligas M.D.   On: 11/30/2014 14:09   Dg Chest Port 1 View  11/20/2014   CLINICAL DATA:  Respiratory failure.  EXAM: PORTABLE CHEST  - 1 VIEW  COMPARISON:  11/13/2014.  FINDINGS: Bilateral IJ lines in stable position. Stable cardiomegaly. Diffuse bilateral pulmonary alveolar infiltrates and bilateral pleural effusions. Pulmonary infiltrates have slightly cleared. These findings are consistent with slight clearing of pulmonary edema . No pneumothorax . No acute bony abnormality.  IMPRESSION: 1. Bilateral IJ lines in stable position. 2. Persistent changes of congestive heart failure with bilateral pulmonary edema and bilateral pleural effusions. Slight improvement of pulmonary edema noted.   Electronically Signed   By: Marcello Moores  Register   On: 11/20/2014 07:35   Dg Chest Port 1 View  11/13/2014  CLINICAL DATA:  Follow-up of pulmonary edema and pleural effusions  EXAM: PORTABLE CHEST - 1 VIEW  COMPARISON:  Portable chest x-ray of November 12, 2014  FINDINGS: The lungs are better inflated today. The pulmonary interstitial markings remain increased. The hemidiaphragms remain obscured. The heart is mildly enlarged. The pulmonary vascularity is engorged.  A dual-lumen large caliber catheter has been placed via the left internal jugular approach. Its tip projects over the midportion of the SVC. A small caliber right internal jugular venous catheter tip projects at the confluence of the right and left brachiocephalic veins. Its tip is oriented toward the left. The bony structures exhibit no acute abnormalities.  IMPRESSION: Congestive heart failure with bilateral pleural effusions and interstitial edema. Inflation of both lungs has improved. There is no pneumothorax following placement of the new left internal jugular venous catheter.   Electronically Signed   By: David  Martinique   On: 11/13/2014 07:54   Dg Chest Port 1 View  11/12/2014   CLINICAL DATA:  Pulmonary edema, history CHF, diabetes, hypertension  EXAM: PORTABLE CHEST - 1 VIEW  COMPARISON:  Portable exam 0642 hr compared to 11/11/2014  FINDINGS: RIGHT jugular line tip projects over SVC  above cavoatrial junction.  Enlargement of cardiac silhouette with pulmonary vascular congestion.  Diffuse infiltrates most consistent with pulmonary edema and CHF.  Bibasilar effusions greater on LEFT.  No pneumothorax.  Scattered endplate spur formation thoracic spine.  Atherosclerotic calcification aorta.  IMPRESSION: CHF with bibasilar effusions greater on LEFT.  No significant interval change.   Electronically Signed   By: Lavonia Dana M.D.   On: 11/12/2014 08:49   Dg Chest Port 1 View  11/11/2014   CLINICAL DATA:  CHF with hypertension and diabetes.  EXAM: PORTABLE CHEST - 1 VIEW  COMPARISON:  11/10/2014  FINDINGS: Right IJ central venous catheter unchanged. Lungs are adequately inflated with persistent moderate bilateral perihilar opacification suggesting interstitial edema. Moderate opacification over the left base unchanged likely effusion with atelectasis. Small right effusion with slight improved aeration over the right base. Mild stable cardiomegaly. Remainder of the exam is unchanged.  IMPRESSION: Persistent left base opacification likely combination of effusion with atelectasis. Small right effusion with improved aeration over the right base.  Persistent moderate perihilar opacification likely interstitial edema. Mild stable cardiomegaly.   Electronically Signed   By: Marin Olp M.D.   On: 11/11/2014 09:01   Dg Chest Port 1 View  11/10/2014   CLINICAL DATA:  Subsequent encounter for congestive failure and pleural effusion.  EXAM: PORTABLE CHEST - 1 VIEW  COMPARISON:  11/09/2014  FINDINGS: 0645 hrs. Stable lung volumes with interval progression of pulmonary edema. Bibasilar atelectasis with small bilateral pleural effusions again noted. The cardio pericardial silhouette is enlarged. Right IJ central line tip overlies the distal SVC level. Imaged bony structures of the thorax are intact.  IMPRESSION: Cardiomegaly with slight interval progression of pulmonary edema.  Persistent bilateral pleural  effusions.   Electronically Signed   By: Misty Stanley M.D.   On: 11/10/2014 08:27   Dg Chest Port 1 View  11/09/2014   CLINICAL DATA:  Congestive heart failure.  EXAM: PORTABLE CHEST - 1 VIEW  COMPARISON:  November 08, 2014.  FINDINGS: Stable cardiomegaly. Right internal jugular catheter line is unchanged with distal tip overlying expected position of the SVC. Slightly increased bilateral perihilar and basilar opacities are noted, consistent with edema and associated pleural effusions. Left is greater than right. No pneumothorax is noted.  IMPRESSION: Slightly increased  bilateral perihilar and basilar edema is noted with associated pleural effusions consistent with congestive heart failure.   Electronically Signed   By: Sabino Dick M.D.   On: 11/09/2014 12:52   Dg Chest Port 1 View  11/08/2014   CLINICAL DATA:  CHF.  Shortness of breath.  EXAM: PORTABLE CHEST - 1 VIEW  COMPARISON:  11/07/2014  FINDINGS: Right jugular central venous catheter remains in place with tip projecting over the lower SVC, unchanged. Cardiac silhouette remains mildly enlarged, unchanged. Thoracic aortic calcification is noted. Pulmonary vascular congestion with perihilar and bibasilar opacities are stable to minimally improved from the prior study. Small bilateral pleural effusions persist. No pneumothorax is identified.  IMPRESSION: Stable to minimally improved pulmonary edema and bibasilar atelectasis. Persistent small bilateral pleural effusions.   Electronically Signed   By: Logan Bores   On: 11/08/2014 07:34   Dg Fluoro Guide Cv Line-no Report  11/30/2014   CLINICAL DATA:    FLOURO GUIDE CV LINE  Fluoroscopy was utilized by the requesting physician.  No radiographic  interpretation.     ASSESSMENT/PLAN:  Acute on chronic renal failure - S/P radiocephalic AV fistula; not to be used in 12 weeks; currently has left internal jugular temporary catheter for dialysis Generalized weakness - for rehabilitation Protein  calorie malnutrition - continue supplementation; prostat 64 30 mg by mouth 3 times a day and Megace 100 mg by mouth daily Diastolic CHF - stable Renovascular hypertension - BP 153/66; continue Lopressor 37.5 mg by mouth every 8 hours; BP/heart rate every shift 1 week Diabetes mellitus, type II - well controlled; hemoglobin A1c 6.1; continue Levemir 20 units subcutaneous twice a day and NovoLog 5 units 3 times a day with meals and sliding scale Anemia of chronic disease - hemoglobin 9.2; continue ferrous sulfate 325 mg by mouth daily,Aranesp 100 g subcutaneous every Wednesdays at dialysis; for colonoscopy when stable Peripheral neuropathy - did not tolerate Neurontin Tachybradycardia syndrome - continue metoprolol; heparin 5000 units subcutaneous every 12 hours Osteoarthritis - continue morphine 20 mg/mL give 5 mg/0.25 mL sublingual every 6 hours when necessary Anxiety - mood is stable; continue diazepam 2.5 mg by mouth daily at bedtime Hypotension - continue midodrine 10 mg by mouth every Monday-Wednesday-Friday - on dialysis days GERD - continue Protonix 40 mg by mouth daily Constipation - continue senna S1 tab by mouth daily at bedtime Depression - mood this is stable; continue Zoloft 100 mg by mouth daily at bedtime   Goals of care:  Short-term rehabilitation   Labs/test ordered:  CBC, CMP   Spent 50 minutes in patient care.   [facility], NP [employer] [phone]

## 2014-12-08 ENCOUNTER — Encounter: Payer: Self-pay | Admitting: Internal Medicine

## 2014-12-08 DIAGNOSIS — N186 End stage renal disease: Secondary | ICD-10-CM | POA: Diagnosis not present

## 2014-12-08 DIAGNOSIS — D631 Anemia in chronic kidney disease: Secondary | ICD-10-CM | POA: Diagnosis not present

## 2014-12-08 NOTE — Progress Notes (Signed)
This encounter was created in error - please disregard.

## 2014-12-11 DIAGNOSIS — N186 End stage renal disease: Secondary | ICD-10-CM | POA: Diagnosis not present

## 2014-12-11 DIAGNOSIS — D631 Anemia in chronic kidney disease: Secondary | ICD-10-CM | POA: Diagnosis not present

## 2014-12-13 ENCOUNTER — Non-Acute Institutional Stay (SKILLED_NURSING_FACILITY): Payer: Medicare Other | Admitting: Internal Medicine

## 2014-12-13 DIAGNOSIS — R5381 Other malaise: Secondary | ICD-10-CM | POA: Diagnosis not present

## 2014-12-13 DIAGNOSIS — K2970 Gastritis, unspecified, without bleeding: Secondary | ICD-10-CM

## 2014-12-13 DIAGNOSIS — E1129 Type 2 diabetes mellitus with other diabetic kidney complication: Secondary | ICD-10-CM | POA: Diagnosis not present

## 2014-12-13 DIAGNOSIS — F32A Depression, unspecified: Secondary | ICD-10-CM

## 2014-12-13 DIAGNOSIS — N186 End stage renal disease: Secondary | ICD-10-CM

## 2014-12-13 DIAGNOSIS — K2990 Gastroduodenitis, unspecified, without bleeding: Secondary | ICD-10-CM

## 2014-12-13 DIAGNOSIS — I953 Hypotension of hemodialysis: Secondary | ICD-10-CM | POA: Diagnosis not present

## 2014-12-13 DIAGNOSIS — E46 Unspecified protein-calorie malnutrition: Secondary | ICD-10-CM

## 2014-12-13 DIAGNOSIS — F329 Major depressive disorder, single episode, unspecified: Secondary | ICD-10-CM

## 2014-12-13 DIAGNOSIS — I5033 Acute on chronic diastolic (congestive) heart failure: Secondary | ICD-10-CM | POA: Diagnosis not present

## 2014-12-13 DIAGNOSIS — I959 Hypotension, unspecified: Secondary | ICD-10-CM | POA: Insufficient documentation

## 2014-12-13 DIAGNOSIS — E1165 Type 2 diabetes mellitus with hyperglycemia: Secondary | ICD-10-CM

## 2014-12-13 DIAGNOSIS — D638 Anemia in other chronic diseases classified elsewhere: Secondary | ICD-10-CM | POA: Insufficient documentation

## 2014-12-13 DIAGNOSIS — F411 Generalized anxiety disorder: Secondary | ICD-10-CM

## 2014-12-13 DIAGNOSIS — K5900 Constipation, unspecified: Secondary | ICD-10-CM

## 2014-12-13 DIAGNOSIS — I150 Renovascular hypertension: Secondary | ICD-10-CM

## 2014-12-13 DIAGNOSIS — IMO0002 Reserved for concepts with insufficient information to code with codable children: Secondary | ICD-10-CM

## 2014-12-13 DIAGNOSIS — Z992 Dependence on renal dialysis: Secondary | ICD-10-CM

## 2014-12-13 DIAGNOSIS — D631 Anemia in chronic kidney disease: Secondary | ICD-10-CM | POA: Diagnosis not present

## 2014-12-13 DIAGNOSIS — G4700 Insomnia, unspecified: Secondary | ICD-10-CM

## 2014-12-13 NOTE — Progress Notes (Signed)
Patient ID: [name] James, female   DOB: November 29, 1936, 78 y.o.   MRN: [id number]    [facility]  PCP: Shirline Frees, MD  Code Status: full code  Allergies  Allergen Reactions  . Erythromycin Swelling  . Penicillins     She has taken keflex many times without porblems  . Codeine Rash    Chief Complaint  Patient presents with  . New Admit To SNF     HPI:  78 y/o female patient is here for STR post hospital admission from [facility] and then [facility]. She was admitted with acute exacerbation of her chf and worsening renal function. She diuresed about 14 l with lasix. She required several units of prbc transfusion for her anemia and underwent EGD showing gastritis and esophagitis. She was put on PPI and aransep was provided. She was treated for e.coli uti. Cardiology and nephrology were consulted during her stay and she had vascular access left AV fistula made for HD. She was started on HD, became hypotensive and required midodrine.  She has pmh of chf, ckd, HTN, DM, anemia and HLD among others She is seen in her room today with her daughter present. She mentions feeling low. She had 2 loose stools today and this is new. Has been having nausea intermittently from prior to hospitalization She feels tired today. Her daughter would like to know is she has f/u with cardiology  Review of Systems:  Constitutional: Negative for fever, chills, diaphoresis.  HENT: Negative for congestion Eyes: Negative for eye pain, blurred vision, double vision and discharge.  Respiratory: Negative for shortness of breath and wheezing.  on o2 and has some cough Cardiovascular: Negative for chest pain, palpitations.  Gastrointestinal: Negative for heartburn, vomiting, abdominal pain. Appetite is fair Genitourinary: she is anuric Musculoskeletal: Negative for back pain, falls. On wheelchair Skin: Negative for itching, rash.  Neurological: Negative for  dizziness, tingling, focal weakness and headaches.  Psychiatric/Behavioral:positive for depression, insomnia. Valium helped her with sleep at home  Past Medical History  Diagnosis Date  . CHF (congestive heart failure) 05/13/2011    echo - [id number]; stage 1 diastolic dysfunction; elevated LV filling pressure, MAC  . Chest pain, atypical 12/05/2008    R/Lmv- normal perfusion all regions, noe ECG changes   . Renal insufficiency 10/30/2005    doppler - abn resistance consistent w/ parenchymal disease  . Claudication 10/30/2005    doppler - normal evaluation, no evidence of aneurysm, diameter reduction, dissection, compression or vascular abnormality)  . DM (diabetes mellitus)   . Peripheral neuropathy   . Hypertension   . Edema   . Gout   . Anxiety   . Arthritis   . Cataract   . GERD (gastroesophageal reflux disease)   . Breast cancer 1980s    bil mastectomies, no radiation or chemo   Past Surgical History  Procedure Laterality Date  . Mastectomy Bilateral ~1982    bil breast reconstruction with implants  . Back surgery  ~1980  . Hemorrhoid surgery    . Cholecystectomy    . Eye surgery    . Orif tibia & fibula fractures Left 2007    also had left non displaced malleolar fracture.   . Esophagogastroduodenoscopy N/A 10/24/2014    Procedure: ESOPHAGOGASTRODUODENOSCOPY (EGD);  Surgeon: Jerene Bears, MD;  [facility] END[facility]COPY;  Service: Endoscopy;  Laterality: N/A;  . Av fistula placement Left 11/30/2014    Procedure: ARTERIOVENOUS (AV) FISTULA CREATION LEFT ARM;  Surgeon:  Angelia Mould, MD;  Location: [place];  Service: Vascular;  Laterality: Left;  . Exchange of a dialysis catheter Left 11/30/2014    Procedure: EXCHANGE OF A DIALYSIS CATHETER, LEFT INTERNAL JUGULAR;  Surgeon: Angelia Mould, MD;  Location: [place];  Service: Vascular;  Laterality: Left;   Social History:   reports that she has never smoked. She does not have any smokeless tobacco history on file. She  reports that she does not drink alcohol. Her drug history is not on file.  Family History  Problem Relation Age of Onset  . Cancer Mother   . Diabetes Mother   . Heart disease Mother   . Heart attack Father   . Stroke Father   . Parkinson's disease Brother   . Cancer Sister     Medications: Patient's Medications  New Prescriptions   No medications on file  Previous Medications   ASPIRIN EC 81 MG TABLET    Take 81 mg by mouth daily.   ATENOLOL (TENORMIN) 50 MG TABLET    Take 50 mg by mouth daily.   BRIMONIDINE (ALPHAGAN) 0.2 % OPHTHALMIC SOLUTION    3 (three) times daily.   CALCIUM ACETATE (PHOSLO) 667 MG CAPSULE    Take 1 capsule (667 mg total) by mouth 3 (three) times daily with meals.   CEPHALEXIN (KEFLEX) 250 MG CAPSULE    Take 1 capsule (250 mg total) by mouth every 12 (twelve) hours.   DARBEPOETIN ALFA (ARANESP) 100 MCG/0.5ML SOSY INJECTION    Inject 0.5 mLs (100 mcg total) into the skin every Friday at 6 PM.   DIAZEPAM (VALIUM) 5 MG TABLET    Take 1/2 tablet by mouth once at bedtime for rest   DORZOLAMIDE-TIMOLOL (COSOPT) 22.3-6.8 MG/ML OPHTHALMIC SOLUTION    1 drop 2 (two) times daily.   ENOXAPARIN (LOVENOX) 30 MG/0.3ML INJECTION    Inject 0.3 mLs (30 mg total) into the skin daily.   FUROSEMIDE (LASIX) 80 MG TABLET    Take 2 tablets (160 mg total) by mouth 3 (three) times daily.   HYDRALAZINE (APRESOLINE) 20 MG/ML INJECTION    Inject 0.5 mLs (10 mg total) into the vein every 6 (six) hours as needed (IF sbp >160 mmHg).   HYDRALAZINE (APRESOLINE) 25 MG TABLET    Take 3 tablets (75 mg total) by mouth every 8 (eight) hours.   INSULIN ASPART (NOVOLOG) 100 UNIT/ML INJECTION    Inject 0-9 Units into the skin 3 (three) times daily with meals.   INSULIN ASPART (NOVOLOG) 100 UNIT/ML INJECTION    Inject 5 Units into the skin 3 (three) times daily with meals.   INSULIN GLARGINE (LANTUS) 100 UNIT/ML INJECTION    Inject 0.24 mLs (24 Units total) into the skin at bedtime.   NUTRITIONAL  SUPPLEMENTS (FEEDING SUPPLEMENT, NEPRO CARB STEADY,) LIQD    Take 237 mLs by mouth daily.   ONDANSETRON (ZOFRAN) 4 MG/2ML SOLN INJECTION    Inject 2 mLs (4 mg total) into the vein every 6 (six) hours as needed for nausea.   OXYCODONE (OXY IR/ROXICODONE) 5 MG IMMEDIATE RELEASE TABLET    Take 0.5 tablets (2.5 mg total) by mouth every 4 (four) hours as needed for severe pain.   PANTOPRAZOLE (PROTONIX) 40 MG TABLET    Take 1 tablet (40 mg total) by mouth daily.   POLYETHYLENE GLYCOL (MIRALAX / GLYCOLAX) PACKET    Take 17 g by mouth daily as needed for mild constipation.   POTASSIUM CHLORIDE SA (K-DUR,KLOR-CON) 20 MEQ TABLET  Take 1 tablet (20 mEq total) by mouth daily.   SIMVASTATIN (ZOCOR) 20 MG TABLET    Take 20 mg by mouth every evening.  Modified Medications   No medications on file  Discontinued Medications   No medications on file     Physical Exam: Filed Vitals:   12/13/14 0920  BP: 150/60  Pulse: 72  Temp: 97.7 F (36.5 C)  Resp: 18  SpO2: 96%    General- elderly obese female in no acute distress Head- atraumatic, normocephalic Eyes- PERRLA, EOMI, no pallor, no icterus, no discharge Neck- no cervical lymphadenopathy Throat- moist mucus membrane Cardiovascular- normal s1,s2, no murmurs, trace edema Respiratory- bilateral clear to auscultation, no wheeze, no rhonchi, no crackles, no use of accessory muscles, on o2 Abdomen- bowel sounds present, soft, non tender Musculoskeletal- able to move all 4 extremities, generalized weakness Neurological- no focal deficit Skin- warm and dry, left chest dialysis catheter, site clean Psychiatry- alert and oriented to person, place and time, flat affect    Labs reviewed: Basic Metabolic Panel:  Recent Labs  11/10/14 0700  11/13/14 0500  11/20/14 0500  12/01/14 1259 12/04/14 0500 12/06/14 0500  NA 142  < > 142  < > 136*  < > 136* 130* 131*  K 5.7*  < > 4.5  < > 3.7  < > 4.6 4.3 3.9  CL 102  < > 101  < > 97  < > 98 93* 92*    CO2 25  < > 21  < > 27  < > 25 24 24   GLUCOSE 178*  < > 215*  < > 162*  < > 199* 122* 118*  BUN 136*  < > 131*  < > 26*  < > 36* 53* 41*  CREATININE 3.41*  < > 3.73*  < > 2.94*  < > 3.65* 5.13* 4.19*  CALCIUM 9.7  < > 8.6  8.3*  < > 8.3*  < > 8.7 8.9 8.7  MG 2.5  --  2.3  --  1.9  --   --   --   --   PHOS 5.7*  --  5.6*  < > 4.8*  < > 2.7 1.7* 1.9*  < > = values in this interval not displayed. Liver Function Tests:  Recent Labs  11/06/14 0755  11/08/14 0714  11/13/14 0500  12/01/14 1259 12/04/14 0500 12/06/14 0500  AST 85*  --  45*  --  47*  --   --   --   --   ALT 65*  --  47*  --  74*  --   --   --   --   ALKPHOS 81  --  84  --  102  --   --   --   --   BILITOT <0.2*  --  <0.2*  --  0.3  --   --   --   --   PROT 6.0  --  5.8*  --  5.8*  --   --   --   --   ALBUMIN 2.4*  < > 3.1*  3.2*  < > 3.0*  < > 2.7* 2.8* 2.7*  < > = values in this interval not displayed. No results for input(s): LIPASE, AMYLASE in the last 8760 hours. No results for input(s): AMMONIA in the last 8760 hours. CBC:  Recent Labs  11/12/14 0500 11/13/14 0500  11/20/14 0500  12/01/14 1258 12/04/14 0500 12/06/14 0500  WBC 9.3 9.3  < >  8.6  < > 7.9 8.3 8.6  NEUTROABS 7.4 7.0  --  5.7  --   --   --   --   HGB 7.3* 8.3*  < > 9.1*  < > 8.8* 9.0* 9.2*  HCT 24.4* 26.2*  < > 29.9*  < > 29.9* 29.9* 29.9*  MCV 100.8* 98.5  < > 101.4*  < > 101.4* 98.4 99.7  PLT 152 158  < > 177  < > 127* 154 214  < > = values in this interval not displayed. Cardiac Enzymes:  Recent Labs  10/17/14 2256 10/18/14 0012 10/18/14 0727 11/25/14 0610  CKTOTAL  --   --   --  11  TROPONINI <0.30 <0.30 <0.30  --    BNP: Invalid input(s): POCBNP CBG:  Recent Labs  11/01/14 2234 11/02/14 0803 11/02/14 1209  GLUCAP 196* 170* 224*   12/08/14 hb 10.6, hct 35.7, plt 351, wbc 9.7, na 132, k 3.9, bun 30, cr 4.2, lft wnl  Assessment/Plan  Physical deconditioning Will have her work with physical therapy and occupational  therapy team to help with gait training and muscle strengthening exercises.fall precautions. Skin care. Encourage to be out of bed.   CHF Currently stable on o2, b blocker. Monitor weight, continue HD  ESRD On hemodialysis at present, tolerating it well. S/P radiocephalic AV fistula, not currently in use. Currently has left internal jugular temporary catheter for dialysis. On renal diet.  Anemia From GI bleed in setting of gastritis and duodenitis. S/p transfusion, monitor h&h. Also has CKD contributing to this. Continue iron supplement and aransep. Pending colonoscopy post rehab. Improved h&h on review  Gastritis and duodenitis Continue protonix 40 mg daily  Protein calorie malnutrition  continue protein supplementation and megace  HD related hypotension Continue midodrine 10 mg on days of dialysis  HTN on Lopressor 37.5 mg tid for now. With SBP elevated I have concern for med non complaince on days of dialysis where pt is not getting it three times a day. Will change it to 50 mg bid and reassess. Will have holding parameters  Diabetes mellitus, type II  continue Levemir 20 units bid and NovoLog 5 units 3 times a day with meals, monitor cbg  Depression On zoloft 100 mg daily. Pt mentions she is finding it tough but able to deal with current situation, no med changes desired by both pt and daughter at present  Constipation On senna s daily and dulcolax suppository. With 2 episodes of loose stool today, hold off on senna s today and change this to daily prn   Anxiety  continue diazepam 2.5 mg daily at bedtime  Insomnia Continue melatonin at bedtime   Goals of care: short term rehabilitation   Family/ staff Communication: reviewed care plan with patient and nursing supervisor    [name], MD  [place] [phone] (Monday-Friday 8 am - 5 pm) [phone] (afterhours)

## 2014-12-16 DIAGNOSIS — N186 End stage renal disease: Secondary | ICD-10-CM | POA: Diagnosis not present

## 2014-12-16 DIAGNOSIS — D631 Anemia in chronic kidney disease: Secondary | ICD-10-CM | POA: Diagnosis not present

## 2014-12-18 ENCOUNTER — Inpatient Hospital Stay (HOSPITAL_COMMUNITY)
Admission: EM | Admit: 2014-12-18 | Discharge: 2014-12-22 | DRG: 308 | Disposition: A | Discharge status: Skilled Nursing Facility | Payer: Medicare Other | Attending: Internal Medicine | Admitting: Internal Medicine

## 2014-12-18 ENCOUNTER — Encounter (HOSPITAL_COMMUNITY): Payer: Self-pay | Admitting: *Deleted

## 2014-12-18 ENCOUNTER — Emergency Department (HOSPITAL_COMMUNITY): Payer: Medicare Other

## 2014-12-18 DIAGNOSIS — I953 Hypotension of hemodialysis: Secondary | ICD-10-CM | POA: Diagnosis not present

## 2014-12-18 DIAGNOSIS — E1165 Type 2 diabetes mellitus with hyperglycemia: Secondary | ICD-10-CM | POA: Diagnosis present

## 2014-12-18 DIAGNOSIS — N179 Acute kidney failure, unspecified: Secondary | ICD-10-CM | POA: Diagnosis not present

## 2014-12-18 DIAGNOSIS — Z79891 Long term (current) use of opiate analgesic: Secondary | ICD-10-CM

## 2014-12-18 DIAGNOSIS — F064 Anxiety disorder due to known physiological condition: Secondary | ICD-10-CM | POA: Diagnosis present

## 2014-12-18 DIAGNOSIS — E1122 Type 2 diabetes mellitus with diabetic chronic kidney disease: Secondary | ICD-10-CM | POA: Diagnosis present

## 2014-12-18 DIAGNOSIS — Z833 Family history of diabetes mellitus: Secondary | ICD-10-CM

## 2014-12-18 DIAGNOSIS — R079 Chest pain, unspecified: Secondary | ICD-10-CM | POA: Diagnosis present

## 2014-12-18 DIAGNOSIS — H269 Unspecified cataract: Secondary | ICD-10-CM | POA: Diagnosis present

## 2014-12-18 DIAGNOSIS — N2581 Secondary hyperparathyroidism of renal origin: Secondary | ICD-10-CM | POA: Diagnosis not present

## 2014-12-18 DIAGNOSIS — E1129 Type 2 diabetes mellitus with other diabetic kidney complication: Secondary | ICD-10-CM | POA: Diagnosis present

## 2014-12-18 DIAGNOSIS — I739 Peripheral vascular disease, unspecified: Secondary | ICD-10-CM | POA: Diagnosis present

## 2014-12-18 DIAGNOSIS — I4891 Unspecified atrial fibrillation: Secondary | ICD-10-CM | POA: Diagnosis not present

## 2014-12-18 DIAGNOSIS — E876 Hypokalemia: Secondary | ICD-10-CM | POA: Diagnosis present

## 2014-12-18 DIAGNOSIS — D631 Anemia in chronic kidney disease: Secondary | ICD-10-CM | POA: Diagnosis not present

## 2014-12-18 DIAGNOSIS — R748 Abnormal levels of other serum enzymes: Secondary | ICD-10-CM

## 2014-12-18 DIAGNOSIS — N186 End stage renal disease: Secondary | ICD-10-CM | POA: Diagnosis not present

## 2014-12-18 DIAGNOSIS — K219 Gastro-esophageal reflux disease without esophagitis: Secondary | ICD-10-CM | POA: Diagnosis present

## 2014-12-18 DIAGNOSIS — R531 Weakness: Secondary | ICD-10-CM | POA: Diagnosis not present

## 2014-12-18 DIAGNOSIS — I120 Hypertensive chronic kidney disease with stage 5 chronic kidney disease or end stage renal disease: Secondary | ICD-10-CM | POA: Diagnosis present

## 2014-12-18 DIAGNOSIS — G629 Polyneuropathy, unspecified: Secondary | ICD-10-CM | POA: Diagnosis present

## 2014-12-18 DIAGNOSIS — Z6835 Body mass index (BMI) 35.0-35.9, adult: Secondary | ICD-10-CM

## 2014-12-18 DIAGNOSIS — F419 Anxiety disorder, unspecified: Secondary | ICD-10-CM | POA: Diagnosis present

## 2014-12-18 DIAGNOSIS — Z992 Dependence on renal dialysis: Secondary | ICD-10-CM

## 2014-12-18 DIAGNOSIS — Z66 Do not resuscitate: Secondary | ICD-10-CM | POA: Diagnosis present

## 2014-12-18 DIAGNOSIS — Z885 Allergy status to narcotic agent status: Secondary | ICD-10-CM

## 2014-12-18 DIAGNOSIS — R002 Palpitations: Secondary | ICD-10-CM | POA: Diagnosis present

## 2014-12-18 DIAGNOSIS — Z7901 Long term (current) use of anticoagulants: Secondary | ICD-10-CM

## 2014-12-18 DIAGNOSIS — Z79899 Other long term (current) drug therapy: Secondary | ICD-10-CM

## 2014-12-18 DIAGNOSIS — I480 Paroxysmal atrial fibrillation: Secondary | ICD-10-CM

## 2014-12-18 DIAGNOSIS — Z794 Long term (current) use of insulin: Secondary | ICD-10-CM

## 2014-12-18 DIAGNOSIS — IMO0002 Reserved for concepts with insufficient information to code with codable children: Secondary | ICD-10-CM | POA: Diagnosis present

## 2014-12-18 DIAGNOSIS — I129 Hypertensive chronic kidney disease with stage 1 through stage 4 chronic kidney disease, or unspecified chronic kidney disease: Secondary | ICD-10-CM | POA: Diagnosis not present

## 2014-12-18 DIAGNOSIS — I10 Essential (primary) hypertension: Secondary | ICD-10-CM | POA: Diagnosis present

## 2014-12-18 DIAGNOSIS — Z881 Allergy status to other antibiotic agents status: Secondary | ICD-10-CM

## 2014-12-18 DIAGNOSIS — M109 Gout, unspecified: Secondary | ICD-10-CM | POA: Diagnosis present

## 2014-12-18 DIAGNOSIS — M1990 Unspecified osteoarthritis, unspecified site: Secondary | ICD-10-CM | POA: Diagnosis present

## 2014-12-18 DIAGNOSIS — J984 Other disorders of lung: Secondary | ICD-10-CM | POA: Diagnosis not present

## 2014-12-18 DIAGNOSIS — D509 Iron deficiency anemia, unspecified: Secondary | ICD-10-CM | POA: Diagnosis present

## 2014-12-18 DIAGNOSIS — K921 Melena: Secondary | ICD-10-CM | POA: Diagnosis present

## 2014-12-18 DIAGNOSIS — I5032 Chronic diastolic (congestive) heart failure: Secondary | ICD-10-CM | POA: Diagnosis present

## 2014-12-18 DIAGNOSIS — F329 Major depressive disorder, single episode, unspecified: Secondary | ICD-10-CM | POA: Diagnosis present

## 2014-12-18 DIAGNOSIS — Z853 Personal history of malignant neoplasm of breast: Secondary | ICD-10-CM

## 2014-12-18 DIAGNOSIS — I959 Hypotension, unspecified: Secondary | ICD-10-CM | POA: Diagnosis present

## 2014-12-18 DIAGNOSIS — Z880 Allergy status to penicillin: Secondary | ICD-10-CM

## 2014-12-18 DIAGNOSIS — E785 Hyperlipidemia, unspecified: Secondary | ICD-10-CM | POA: Diagnosis present

## 2014-12-18 HISTORY — DX: Other complications of anesthesia, initial encounter: T88.59XA

## 2014-12-18 HISTORY — DX: Other specified postprocedural states: Z98.890

## 2014-12-18 HISTORY — DX: Family history of other specified conditions: Z84.89

## 2014-12-18 HISTORY — DX: Nausea with vomiting, unspecified: R11.2

## 2014-12-18 HISTORY — DX: Adverse effect of unspecified anesthetic, initial encounter: T41.45XA

## 2014-12-18 LAB — CBC WITH DIFFERENTIAL/PLATELET
Basophils Absolute: 0 10*3/uL (ref 0.0–0.1)
Basophils Relative: 0 % (ref 0–1)
Eosinophils Absolute: 0.2 10*3/uL (ref 0.0–0.7)
Eosinophils Relative: 2 % (ref 0–5)
HCT: 37.6 % (ref 36.0–46.0)
Hemoglobin: 11.2 g/dL — ABNORMAL LOW (ref 12.0–15.0)
LYMPHS ABS: 3.7 10*3/uL (ref 0.7–4.0)
Lymphocytes Relative: 38 % (ref 12–46)
MCH: 29.2 pg (ref 26.0–34.0)
MCHC: 29.8 g/dL — AB (ref 30.0–36.0)
MCV: 98.2 fL (ref 78.0–100.0)
Monocytes Absolute: 0.9 10*3/uL (ref 0.1–1.0)
Monocytes Relative: 9 % (ref 3–12)
Neutro Abs: 4.8 10*3/uL (ref 1.7–7.7)
Neutrophils Relative %: 51 % (ref 43–77)
PLATELETS: 159 10*3/uL (ref 150–400)
RBC: 3.83 MIL/uL — AB (ref 3.87–5.11)
RDW: 17 % — ABNORMAL HIGH (ref 11.5–15.5)
WBC: 9.5 10*3/uL (ref 4.0–10.5)

## 2014-12-18 LAB — BASIC METABOLIC PANEL
Anion gap: 12 (ref 5–15)
BUN: 24 mg/dL — ABNORMAL HIGH (ref 6–23)
CO2: 27 mmol/L (ref 19–32)
Calcium: 8.3 mg/dL — ABNORMAL LOW (ref 8.4–10.5)
Chloride: 102 mEq/L (ref 96–112)
Creatinine, Ser: 3.54 mg/dL — ABNORMAL HIGH (ref 0.50–1.10)
GFR calc Af Amer: 13 mL/min — ABNORMAL LOW (ref >90)
GFR calc non Af Amer: 12 mL/min — ABNORMAL LOW (ref >90)
Glucose, Bld: 166 mg/dL — ABNORMAL HIGH (ref 70–99)
Potassium: 3 mmol/L — ABNORMAL LOW (ref 3.5–5.1)
Sodium: 141 mmol/L (ref 135–145)

## 2014-12-18 LAB — TROPONIN I: Troponin I: 0.07 ng/mL — ABNORMAL HIGH (ref <0.031)

## 2014-12-18 MED ORDER — ONDANSETRON HCL 4 MG/2ML IJ SOLN
4.0000 mg | Freq: Once | INTRAMUSCULAR | Status: AC
  Administered 2014-12-18: 4 mg via INTRAVENOUS
  Filled 2014-12-18: qty 2

## 2014-12-18 MED ORDER — MORPHINE SULFATE 2 MG/ML IJ SOLN
2.0000 mg | Freq: Once | INTRAMUSCULAR | Status: AC
  Administered 2014-12-18: 2 mg via INTRAVENOUS
  Filled 2014-12-18: qty 1

## 2014-12-18 MED ORDER — ONDANSETRON HCL 4 MG/2ML IJ SOLN: 4.0000 mg | Freq: Once | INTRAMUSCULAR | Status: DC

## 2014-12-18 MED ORDER — POTASSIUM CHLORIDE CRYS ER 20 MEQ PO TBCR
40.0000 meq | EXTENDED_RELEASE_TABLET | Freq: Two times a day (BID) | ORAL | Status: DC
  Administered 2014-12-18: 40 meq via ORAL
  Filled 2014-12-18: qty 2

## 2014-12-18 NOTE — ED Notes (Signed)
Patient was at dialysis today and approx 3.5 hrs into treatment her heart rate became irregular ranging from 60's to 120's per EMS, since EMS arrival patient SR in 80's, patient only complains of tingling and tremor like sensation in hands, denies any other c/o

## 2014-12-18 NOTE — ED Notes (Signed)
Attempted IV x1. IV blew after ZOfran was given.

## 2014-12-18 NOTE — ED Provider Notes (Addendum)
CSN: [id number]     Arrival date & time 12/18/14  1630 History   First MD Initiated Contact with Patient 12/18/14 1633     Chief Complaint  Patient presents with  . Palpitations     (Consider location/radiation/quality/duration/timing/severity/associated sxs/prior Treatment) HPI patient was at dialysis today when she felt palpitations with irregular pulse from 60-120 and chest pain. She recently started dialysis. She has multiple cardiac risk factors including diabetes, hypertension, obesity, congestive heart failure. Also complains of slight dyspnea. Severity is moderate. Primary care is Dr. Samara Snide and nephrologist is Dr. Erling Cruz  Past Medical History  Diagnosis Date  . CHF (congestive heart failure) 05/13/2011    echo - [id number]; stage 1 diastolic dysfunction; elevated LV filling pressure, MAC  . Chest pain, atypical 12/05/2008    R/Lmv- normal perfusion all regions, noe ECG changes   . Renal insufficiency 10/30/2005    doppler - abn resistance consistent w/ parenchymal disease  . Claudication 10/30/2005    doppler - normal evaluation, no evidence of aneurysm, diameter reduction, dissection, compression or vascular abnormality)  . DM (diabetes mellitus)   . Peripheral neuropathy   . Hypertension   . Edema   . Gout   . Anxiety   . Arthritis   . Cataract   . GERD (gastroesophageal reflux disease)   . Breast cancer 1980s    bil mastectomies, no radiation or chemo   Past Surgical History  Procedure Laterality Date  . Mastectomy Bilateral ~1982    bil breast reconstruction with implants  . Back surgery  ~1980  . Hemorrhoid surgery    . Cholecystectomy    . Eye surgery    . Orif tibia & fibula fractures Left 2007    also had left non displaced malleolar fracture.   . Esophagogastroduodenoscopy N/A 10/24/2014    Procedure: ESOPHAGOGASTRODUODENOSCOPY (EGD);  Surgeon: Jerene Bears, MD;  Location: [facility] ENDOSCOPY;  Service: Endoscopy;  Laterality: N/A;  . Av fistula placement  Left 11/30/2014    Procedure: ARTERIOVENOUS (AV) FISTULA CREATION LEFT ARM;  Surgeon: Angelia Mould, MD;  Location: [place];  Service: Vascular;  Laterality: Left;  . Exchange of a dialysis catheter Left 11/30/2014    Procedure: EXCHANGE OF A DIALYSIS CATHETER, LEFT INTERNAL JUGULAR;  Surgeon: Angelia Mould, MD;  Location: [facility] OR;  Service: Vascular;  Laterality: Left;   Family History  Problem Relation Age of Onset  . Cancer Mother   . Diabetes Mother   . Heart disease Mother   . Heart attack Father   . Stroke Father   . Parkinson's disease Brother   . Cancer Sister    History  Substance Use Topics  . Smoking status: Never Smoker   . Smokeless tobacco: Not on file  . Alcohol Use: No   OB History    No data available     Review of Systems  All other systems reviewed and are negative.     Allergies  Erythromycin; Penicillins; and Codeine  Home Medications   Prior to Admission medications   Medication Sig Start Date End Date Taking? Authorizing Provider  Amino Acids-Protein Hydrolys (FEEDING SUPPLEMENT, PRO-STAT SUGAR FREE 64,) LIQD Take 30 mLs by mouth 3 (three) times daily with meals.   Yes Historical Provider, MD  b complex-vitamin c-folic acid (NEPHRO-VITE) 0.8 MG TABS tablet Take 1 tablet by mouth at bedtime.   Yes Historical Provider, MD  calcium acetate (PHOSLO) 667 MG capsule Take 1 capsule (667 mg total) by mouth 3 (  three) times daily with meals. 11/02/14  Yes Geradine Girt, DO  Darbepoetin Alfa (ARANESP) 100 MCG/0.5ML SOSY injection Inject 0.5 mLs (100 mcg total) into the skin every Friday at 6 PM. Patient taking differently: Inject 100 mcg into the skin every Wednesday.  11/02/14  Yes Geradine Girt, DO  diazepam (VALIUM) 5 MG tablet Take 1/2 tablet by mouth once at bedtime for rest 12/07/14  Yes Lauree Chandler, NP  dorzolamide-timolol (COSOPT) 22.3-6.8 MG/ML ophthalmic solution Place 1 drop into the left eye 2 (two) times daily.    Yes Historical  Provider, MD  ferrous sulfate 325 (65 FE) MG tablet Take 325 mg by mouth daily with breakfast.   Yes Historical Provider, MD  folic acid (FOLVITE) 1 MG tablet Take 1 mg by mouth daily.   Yes Historical Provider, MD  heparin 5000 UNIT/ML injection Inject 5,000 Units into the skin every 12 (twelve) hours.   Yes Historical Provider, MD  insulin aspart (NOVOLOG) 100 UNIT/ML injection Inject 5 Units into the skin 3 (three) times daily before meals.   Yes Historical Provider, MD  insulin aspart (NOVOLOG) 100 UNIT/ML injection Inject 0-9 Units into the skin 3 (three) times daily before meals. Sliding scale   Yes Historical Provider, MD  insulin detemir (LEVEMIR) 100 UNIT/ML injection Inject 20 Units into the skin at bedtime.   Yes Historical Provider, MD  loratadine (CLARITIN) 10 MG tablet Take 10 mg by mouth daily.   Yes Historical Provider, MD  megestrol (MEGACE) 40 MG/ML suspension Take 40 mg by mouth daily.   Yes Historical Provider, MD  Melatonin 3 MG TABS Take 6 mg by mouth at bedtime.   Yes Historical Provider, MD  metoprolol (LOPRESSOR) 50 MG tablet Take 50 mg by mouth 2 (two) times daily.   Yes Historical Provider, MD  midodrine (PROAMATINE) 10 MG tablet Take 10 mg by mouth every Monday, Wednesday, and Friday.   Yes Historical Provider, MD  pantoprazole (PROTONIX) 40 MG tablet Take 1 tablet (40 mg total) by mouth daily. 11/02/14  Yes Geradine Girt, DO  promethazine (PHENERGAN) 25 MG tablet Take 25 mg by mouth 2 (two) times daily as needed for nausea or vomiting.   Yes Historical Provider, MD  sennosides-docusate sodium (SENOKOT-S) 8.6-50 MG tablet Take 1 tablet by mouth daily.   Yes Historical Provider, MD  sertraline (ZOLOFT) 100 MG tablet Take 100 mg by mouth daily.   Yes Historical Provider, MD  vitamin C (ASCORBIC ACID) 500 MG tablet Take 500 mg by mouth 2 (two) times daily.   Yes Historical Provider, MD  enoxaparin (LOVENOX) 30 MG/0.3ML injection Inject 0.3 mLs (30 mg total) into the skin  daily. 11/02/14   Geradine Girt, DO  furosemide (LASIX) 80 MG tablet Take 2 tablets (160 mg total) by mouth 3 (three) times daily. 11/02/14   Geradine Girt, DO  hydrALAZINE (APRESOLINE) 20 MG/ML injection Inject 0.5 mLs (10 mg total) into the vein every 6 (six) hours as needed (IF sbp >160 mmHg). 11/02/14   Geradine Girt, DO  hydrALAZINE (APRESOLINE) 25 MG tablet Take 3 tablets (75 mg total) by mouth every 8 (eight) hours. 11/02/14   Geradine Girt, DO  insulin aspart (NOVOLOG) 100 UNIT/ML injection Inject 0-9 Units into the skin 3 (three) times daily with meals. 11/02/14   Geradine Girt, DO  insulin aspart (NOVOLOG) 100 UNIT/ML injection Inject 5 Units into the skin 3 (three) times daily with meals. 11/02/14   Geradine Girt,  DO  insulin glargine (LANTUS) 100 UNIT/ML injection Inject 0.24 mLs (24 Units total) into the skin at bedtime. 11/02/14   Geradine Girt, DO  Nutritional Supplements (FEEDING SUPPLEMENT, NEPRO CARB STEADY,) LIQD Take 237 mLs by mouth daily. 11/02/14   Geradine Girt, DO  ondansetron (ZOFRAN) 4 MG/2ML SOLN injection Inject 2 mLs (4 mg total) into the vein every 6 (six) hours as needed for nausea. 11/02/14   Geradine Girt, DO  oxyCODONE (OXY IR/ROXICODONE) 5 MG immediate release tablet Take 0.5 tablets (2.5 mg total) by mouth every 4 (four) hours as needed for severe pain. 11/02/14   Geradine Girt, DO  polyethylene glycol (MIRALAX / GLYCOLAX) packet Take 17 g by mouth daily as needed for mild constipation. 11/02/14   Geradine Girt, DO  potassium chloride SA (K-DUR,KLOR-CON) 20 MEQ tablet Take 1 tablet (20 mEq total) by mouth daily. 11/02/14   Geradine Girt, DO  simvastatin (ZOCOR) 20 MG tablet Take 20 mg by mouth every evening.    Historical Provider, MD   BP 131/51 mmHg  Pulse 83  Temp(Src) 97.7 F (36.5 C) (Oral)  Resp 21  Wt 224 lb 13.9 oz (102 kg)  SpO2 100% Physical Exam  Constitutional: She is oriented to person, place, and time.  Obese, pale, older than  stated age  HENT:  Head: Normocephalic and atraumatic.  Eyes: Conjunctivae and EOM are normal. Pupils are equal, round, and reactive to light.  Neck: Normal range of motion. Neck supple.  Cardiovascular: Normal rate and regular rhythm.   Pulmonary/Chest: Effort normal and breath sounds normal.  Abdominal: Soft. Bowel sounds are normal.  Musculoskeletal: Normal range of motion.  Neurological: She is alert and oriented to person, place, and time.  Skin: Skin is warm and dry.  Minimal peripheral edema  Psychiatric: She has a normal mood and affect. Her behavior is normal.  Nursing note and vitals reviewed.   ED Course  Procedures (including critical care time) Labs Review Labs Reviewed  CBC WITH DIFFERENTIAL - Abnormal; Notable for the following:    RBC 3.83 (*)    Hemoglobin 11.2 (*)    MCHC 29.8 (*)    RDW 17.0 (*)    All other components within normal limits  BASIC METABOLIC PANEL - Abnormal; Notable for the following:    Potassium 3.0 (*)    Glucose, Bld 166 (*)    BUN 24 (*)    Creatinine, Ser 3.54 (*)    Calcium 8.3 (*)    GFR calc non Af Amer 12 (*)    GFR calc Af Amer 13 (*)    All other components within normal limits  TROPONIN I - Abnormal; Notable for the following:    Troponin I 0.07 (*)    All other components within normal limits    Imaging Review Dg Chest Port 1 View  12/18/2014   CLINICAL DATA:  Irregular heart rate at dialysis today, hand tremor. History CHF, renal insufficiency, breast cancer.  EXAM: PORTABLE CHEST - 1 VIEW  COMPARISON:  Chest radiograph November 30, 2014  FINDINGS: Cardiac silhouette is unremarkable and unchanged. Mildly calcified aortic knob. Hazy density in the LEFT lung base. Tunneled dialysis catheter via LEFT internal jugular venous approach with distal tips in the cavoatrial junction, unchanged. No pneumothorax. Soft tissue planes and included osseous structures are nonsuspicious. Interval removal of RIGHT internal jugular intravenous  line.  IMPRESSION: Hazy density in LEFT lung base may reflect small layering pleural effusion, no frank  pulmonary edema on today's examination.   Electronically Signed   By: Elon Alas   On: 12/18/2014 19:59     EKG Interpretation   Date/Time:  Monday December 18 2014 19:20:46 EST Ventricular Rate:  79 PR Interval:  173 QRS Duration: 97 QT Interval:  405 QTC Calculation: 464 R Axis:   18 Text Interpretation:  Sinus rhythm Confirmed by Jathen Sudano  MD, Anastashia Westerfeld ([id number]) on  12/18/2014 7:25:38 PM      MDM   Final diagnoses:  Weakness  Palpitations  End stage renal disease    Patient has multiple cardiac risk factors. She complains of chest pain and palpitations. EKG shows no acute changes. Troponin 0.07 Discussed with Dr. Blaine Hamper.   Will also consult cardiology.    Nat Christen, MD 12/19/14 0020  Nat Christen, MD 12/19/14 [phone]

## 2014-12-18 NOTE — ED Notes (Signed)
MD made aware of the patient's chest pain.

## 2014-12-19 ENCOUNTER — Encounter (HOSPITAL_COMMUNITY): Payer: Self-pay | Admitting: General Practice

## 2014-12-19 DIAGNOSIS — I4891 Unspecified atrial fibrillation: Secondary | ICD-10-CM | POA: Diagnosis not present

## 2014-12-19 DIAGNOSIS — R2689 Other abnormalities of gait and mobility: Secondary | ICD-10-CM | POA: Diagnosis not present

## 2014-12-19 DIAGNOSIS — I480 Paroxysmal atrial fibrillation: Secondary | ICD-10-CM | POA: Diagnosis not present

## 2014-12-19 DIAGNOSIS — M6281 Muscle weakness (generalized): Secondary | ICD-10-CM | POA: Diagnosis not present

## 2014-12-19 DIAGNOSIS — I10 Essential (primary) hypertension: Secondary | ICD-10-CM | POA: Diagnosis not present

## 2014-12-19 DIAGNOSIS — Z992 Dependence on renal dialysis: Secondary | ICD-10-CM

## 2014-12-19 DIAGNOSIS — D509 Iron deficiency anemia, unspecified: Secondary | ICD-10-CM

## 2014-12-19 DIAGNOSIS — E1165 Type 2 diabetes mellitus with hyperglycemia: Secondary | ICD-10-CM | POA: Diagnosis present

## 2014-12-19 DIAGNOSIS — Z853 Personal history of malignant neoplasm of breast: Secondary | ICD-10-CM | POA: Diagnosis not present

## 2014-12-19 DIAGNOSIS — I739 Peripheral vascular disease, unspecified: Secondary | ICD-10-CM | POA: Diagnosis present

## 2014-12-19 DIAGNOSIS — D631 Anemia in chronic kidney disease: Secondary | ICD-10-CM | POA: Diagnosis not present

## 2014-12-19 DIAGNOSIS — N186 End stage renal disease: Secondary | ICD-10-CM

## 2014-12-19 DIAGNOSIS — N189 Chronic kidney disease, unspecified: Secondary | ICD-10-CM | POA: Diagnosis not present

## 2014-12-19 DIAGNOSIS — I509 Heart failure, unspecified: Secondary | ICD-10-CM | POA: Diagnosis not present

## 2014-12-19 DIAGNOSIS — Z794 Long term (current) use of insulin: Secondary | ICD-10-CM | POA: Diagnosis not present

## 2014-12-19 DIAGNOSIS — I5032 Chronic diastolic (congestive) heart failure: Secondary | ICD-10-CM | POA: Diagnosis present

## 2014-12-19 DIAGNOSIS — R531 Weakness: Secondary | ICD-10-CM | POA: Diagnosis not present

## 2014-12-19 DIAGNOSIS — R278 Other lack of coordination: Secondary | ICD-10-CM | POA: Diagnosis not present

## 2014-12-19 DIAGNOSIS — F329 Major depressive disorder, single episode, unspecified: Secondary | ICD-10-CM | POA: Diagnosis present

## 2014-12-19 DIAGNOSIS — G629 Polyneuropathy, unspecified: Secondary | ICD-10-CM | POA: Diagnosis present

## 2014-12-19 DIAGNOSIS — R195 Other fecal abnormalities: Secondary | ICD-10-CM | POA: Diagnosis not present

## 2014-12-19 DIAGNOSIS — N2581 Secondary hyperparathyroidism of renal origin: Secondary | ICD-10-CM | POA: Diagnosis present

## 2014-12-19 DIAGNOSIS — Z833 Family history of diabetes mellitus: Secondary | ICD-10-CM | POA: Diagnosis not present

## 2014-12-19 DIAGNOSIS — I120 Hypertensive chronic kidney disease with stage 5 chronic kidney disease or end stage renal disease: Secondary | ICD-10-CM | POA: Diagnosis present

## 2014-12-19 DIAGNOSIS — I517 Cardiomegaly: Secondary | ICD-10-CM | POA: Diagnosis not present

## 2014-12-19 DIAGNOSIS — Z6835 Body mass index (BMI) 35.0-35.9, adult: Secondary | ICD-10-CM | POA: Diagnosis not present

## 2014-12-19 DIAGNOSIS — R002 Palpitations: Secondary | ICD-10-CM | POA: Diagnosis present

## 2014-12-19 DIAGNOSIS — N179 Acute kidney failure, unspecified: Secondary | ICD-10-CM | POA: Diagnosis not present

## 2014-12-19 DIAGNOSIS — Z880 Allergy status to penicillin: Secondary | ICD-10-CM | POA: Diagnosis not present

## 2014-12-19 DIAGNOSIS — H269 Unspecified cataract: Secondary | ICD-10-CM | POA: Diagnosis present

## 2014-12-19 DIAGNOSIS — E1129 Type 2 diabetes mellitus with other diabetic kidney complication: Secondary | ICD-10-CM | POA: Diagnosis not present

## 2014-12-19 DIAGNOSIS — E785 Hyperlipidemia, unspecified: Secondary | ICD-10-CM | POA: Diagnosis present

## 2014-12-19 DIAGNOSIS — F064 Anxiety disorder due to known physiological condition: Secondary | ICD-10-CM | POA: Diagnosis present

## 2014-12-19 DIAGNOSIS — Z66 Do not resuscitate: Secondary | ICD-10-CM | POA: Diagnosis present

## 2014-12-19 DIAGNOSIS — Z79891 Long term (current) use of opiate analgesic: Secondary | ICD-10-CM | POA: Diagnosis not present

## 2014-12-19 DIAGNOSIS — E119 Type 2 diabetes mellitus without complications: Secondary | ICD-10-CM | POA: Diagnosis not present

## 2014-12-19 DIAGNOSIS — Z881 Allergy status to other antibiotic agents status: Secondary | ICD-10-CM | POA: Diagnosis not present

## 2014-12-19 DIAGNOSIS — I953 Hypotension of hemodialysis: Secondary | ICD-10-CM | POA: Diagnosis present

## 2014-12-19 DIAGNOSIS — R7989 Other specified abnormal findings of blood chemistry: Secondary | ICD-10-CM

## 2014-12-19 DIAGNOSIS — K219 Gastro-esophageal reflux disease without esophagitis: Secondary | ICD-10-CM | POA: Diagnosis present

## 2014-12-19 DIAGNOSIS — Z885 Allergy status to narcotic agent status: Secondary | ICD-10-CM | POA: Diagnosis not present

## 2014-12-19 DIAGNOSIS — K921 Melena: Secondary | ICD-10-CM | POA: Diagnosis present

## 2014-12-19 DIAGNOSIS — E876 Hypokalemia: Secondary | ICD-10-CM | POA: Diagnosis present

## 2014-12-19 DIAGNOSIS — R079 Chest pain, unspecified: Secondary | ICD-10-CM | POA: Diagnosis present

## 2014-12-19 DIAGNOSIS — Z79899 Other long term (current) drug therapy: Secondary | ICD-10-CM | POA: Diagnosis not present

## 2014-12-19 DIAGNOSIS — F419 Anxiety disorder, unspecified: Secondary | ICD-10-CM | POA: Diagnosis not present

## 2014-12-19 DIAGNOSIS — M1990 Unspecified osteoarthritis, unspecified site: Secondary | ICD-10-CM | POA: Diagnosis present

## 2014-12-19 DIAGNOSIS — Z7901 Long term (current) use of anticoagulants: Secondary | ICD-10-CM | POA: Diagnosis not present

## 2014-12-19 DIAGNOSIS — E1122 Type 2 diabetes mellitus with diabetic chronic kidney disease: Secondary | ICD-10-CM | POA: Diagnosis present

## 2014-12-19 DIAGNOSIS — M109 Gout, unspecified: Secondary | ICD-10-CM | POA: Diagnosis present

## 2014-12-19 LAB — MAGNESIUM: Magnesium: 2.1 mg/dL (ref 1.5–2.5)

## 2014-12-19 LAB — TROPONIN I
Troponin I: 0.09 ng/mL — ABNORMAL HIGH (ref <0.031)
Troponin I: 0.09 ng/mL — ABNORMAL HIGH (ref <0.031)
Troponin I: 0.08 ng/mL — ABNORMAL HIGH (ref <0.031)

## 2014-12-19 LAB — BASIC METABOLIC PANEL
ANION GAP: 15 (ref 5–15)
BUN: 31 mg/dL — ABNORMAL HIGH (ref 6–23)
CHLORIDE: 101 meq/L (ref 96–112)
CO2: 24 mmol/L (ref 19–32)
Calcium: 8.4 mg/dL (ref 8.4–10.5)
Creatinine, Ser: 4.42 mg/dL — ABNORMAL HIGH (ref 0.50–1.10)
GFR calc Af Amer: 10 mL/min — ABNORMAL LOW (ref >90)
GFR calc non Af Amer: 9 mL/min — ABNORMAL LOW (ref >90)
Glucose, Bld: 156 mg/dL — ABNORMAL HIGH (ref 70–99)
Potassium: 3.6 mmol/L (ref 3.5–5.1)
Sodium: 140 mmol/L (ref 135–145)

## 2014-12-19 LAB — CBC
HCT: 36.3 % (ref 36.0–46.0)
HEMOGLOBIN: 10.6 g/dL — AB (ref 12.0–15.0)
MCH: 28.9 pg (ref 26.0–34.0)
MCHC: 29.2 g/dL — ABNORMAL LOW (ref 30.0–36.0)
MCV: 98.9 fL (ref 78.0–100.0)
Platelets: 211 10*3/uL (ref 150–400)
RBC: 3.67 MIL/uL — AB (ref 3.87–5.11)
RDW: 17.1 % — ABNORMAL HIGH (ref 11.5–15.5)
WBC: 10.4 10*3/uL (ref 4.0–10.5)

## 2014-12-19 LAB — CBG MONITORING, ED: Glucose-Capillary: 147 mg/dL — ABNORMAL HIGH (ref 70–99)

## 2014-12-19 LAB — MRSA PCR SCREENING: MRSA BY PCR: NEGATIVE

## 2014-12-19 LAB — HEPARIN LEVEL (UNFRACTIONATED): Heparin Unfractionated: 0.12 IU/mL — ABNORMAL LOW (ref 0.30–0.70)

## 2014-12-19 LAB — PROTIME-INR
INR: 1.14 (ref 0.00–1.49)
Prothrombin Time: 14.8 seconds (ref 11.6–15.2)

## 2014-12-19 LAB — GLUCOSE, CAPILLARY
Glucose-Capillary: 168 mg/dL — ABNORMAL HIGH (ref 70–99)
Glucose-Capillary: 183 mg/dL — ABNORMAL HIGH (ref 70–99)

## 2014-12-19 MED ORDER — HEPARIN BOLUS VIA INFUSION
2000.0000 [IU] | Freq: Once | INTRAVENOUS | Status: AC
  Administered 2014-12-19: 2000 [IU] via INTRAVENOUS
  Filled 2014-12-19: qty 2000

## 2014-12-19 MED ORDER — CALCIUM ACETATE 667 MG PO CAPS
667.0000 mg | ORAL_CAPSULE | Freq: Three times a day (TID) | ORAL | Status: DC
  Administered 2014-12-19 – 2014-12-22 (×6): 667 mg via ORAL
  Filled 2014-12-19 (×13): qty 1

## 2014-12-19 MED ORDER — SODIUM CHLORIDE 0.9 % IJ SOLN
3.0000 mL | Freq: Two times a day (BID) | INTRAMUSCULAR | Status: DC
  Administered 2014-12-19 – 2014-12-21 (×3): 3 mL via INTRAVENOUS

## 2014-12-19 MED ORDER — MELATONIN 3 MG PO TABS
6.0000 mg | ORAL_TABLET | Freq: Every day | ORAL | Status: DC
  Administered 2014-12-19 – 2014-12-21 (×3): 6 mg via ORAL
  Filled 2014-12-19 (×5): qty 2

## 2014-12-19 MED ORDER — HYDRALAZINE HCL 50 MG PO TABS
75.0000 mg | ORAL_TABLET | Freq: Three times a day (TID) | ORAL | Status: DC
  Filled 2014-12-19 (×4): qty 1

## 2014-12-19 MED ORDER — POLYETHYLENE GLYCOL 3350 17 G PO PACK
17.0000 g | PACK | Freq: Every day | ORAL | Status: DC | PRN
  Filled 2014-12-19: qty 1

## 2014-12-19 MED ORDER — INSULIN GLARGINE 100 UNIT/ML ~~LOC~~ SOLN
15.0000 [IU] | Freq: Every day | SUBCUTANEOUS | Status: DC
  Administered 2014-12-20 – 2014-12-21 (×2): 15 [IU] via SUBCUTANEOUS
  Filled 2014-12-19 (×4): qty 0.15

## 2014-12-19 MED ORDER — NEPRO/CARBSTEADY PO LIQD
237.0000 mL | ORAL | Status: DC
Start: 2014-12-19 — End: 2014-12-22
  Administered 2014-12-21 – 2014-12-22 (×2): 237 mL via ORAL
  Filled 2014-12-19 (×5): qty 237

## 2014-12-19 MED ORDER — DARBEPOETIN ALFA 150 MCG/0.3ML IJ SOSY
150.0000 ug | PREFILLED_SYRINGE | INTRAMUSCULAR | Status: DC
  Administered 2014-12-20: 150 ug via INTRAVENOUS
  Filled 2014-12-19: qty 0.3

## 2014-12-19 MED ORDER — HEPARIN (PORCINE) IN NACL 100-0.45 UNIT/ML-% IJ SOLN
900.0000 [IU]/h | INTRAMUSCULAR | Status: DC
  Administered 2014-12-19: 900 [IU]/h via INTRAVENOUS
  Filled 2014-12-19: qty 250

## 2014-12-19 MED ORDER — PROMETHAZINE HCL 25 MG PO TABS
25.0000 mg | ORAL_TABLET | Freq: Two times a day (BID) | ORAL | Status: DC | PRN
  Filled 2014-12-19: qty 1

## 2014-12-19 MED ORDER — MEGESTROL ACETATE 40 MG/ML PO SUSP
40.0000 mg | Freq: Every day | ORAL | Status: DC
  Filled 2014-12-19: qty 5

## 2014-12-19 MED ORDER — NITROGLYCERIN 0.4 MG SL SUBL: 0.4000 mg | SUBLINGUAL_TABLET | SUBLINGUAL | Status: DC | PRN

## 2014-12-19 MED ORDER — ATORVASTATIN CALCIUM 80 MG PO TABS
80.0000 mg | ORAL_TABLET | Freq: Every day | ORAL | Status: DC
  Administered 2014-12-19 – 2014-12-21 (×3): 80 mg via ORAL
  Filled 2014-12-19 (×3): qty 1

## 2014-12-19 MED ORDER — ASPIRIN 81 MG PO CHEW
324.0000 mg | CHEWABLE_TABLET | Freq: Every day | ORAL | Status: DC
  Administered 2014-12-19 – 2014-12-22 (×4): 324 mg via ORAL
  Filled 2014-12-19 (×4): qty 4

## 2014-12-19 MED ORDER — OXYCODONE HCL 5 MG PO TABS
2.5000 mg | ORAL_TABLET | ORAL | Status: DC | PRN
  Administered 2014-12-19: 2.5 mg via ORAL
  Filled 2014-12-19: qty 1

## 2014-12-19 MED ORDER — RENA-VITE PO TABS
1.0000 | ORAL_TABLET | Freq: Every day | ORAL | Status: DC
  Administered 2014-12-19 – 2014-12-21 (×3): 1 via ORAL
  Filled 2014-12-19 (×4): qty 1

## 2014-12-19 MED ORDER — VITAMIN C 500 MG PO TABS
500.0000 mg | ORAL_TABLET | Freq: Two times a day (BID) | ORAL | Status: DC
  Administered 2014-12-19 – 2014-12-21 (×4): 500 mg via ORAL
  Filled 2014-12-19 (×5): qty 1

## 2014-12-19 MED ORDER — LORATADINE 10 MG PO TABS
10.0000 mg | ORAL_TABLET | Freq: Every day | ORAL | Status: DC
  Administered 2014-12-19 – 2014-12-22 (×4): 10 mg via ORAL
  Filled 2014-12-19 (×4): qty 1

## 2014-12-19 MED ORDER — DORZOLAMIDE HCL-TIMOLOL MAL 2-0.5 % OP SOLN
1.0000 [drp] | Freq: Two times a day (BID) | OPHTHALMIC | Status: DC
  Administered 2014-12-19 – 2014-12-22 (×5): 1 [drp] via OPHTHALMIC
  Filled 2014-12-19 (×3): qty 10

## 2014-12-19 MED ORDER — MORPHINE SULFATE 2 MG/ML IJ SOLN: 2.0000 mg | INTRAMUSCULAR | Status: DC | PRN

## 2014-12-19 MED ORDER — SERTRALINE HCL 100 MG PO TABS
100.0000 mg | ORAL_TABLET | Freq: Every day | ORAL | Status: DC
  Administered 2014-12-19: 100 mg via ORAL
  Filled 2014-12-19: qty 2

## 2014-12-19 MED ORDER — MEGESTROL ACETATE 20 MG PO TABS
40.0000 mg | ORAL_TABLET | Freq: Every day | ORAL | Status: DC
  Administered 2014-12-19 – 2014-12-22 (×3): 40 mg via ORAL
  Filled 2014-12-19 (×4): qty 2

## 2014-12-19 MED ORDER — FOLIC ACID 1 MG PO TABS
1.0000 mg | ORAL_TABLET | Freq: Every day | ORAL | Status: DC
  Administered 2014-12-19 – 2014-12-22 (×4): 1 mg via ORAL
  Filled 2014-12-19 (×4): qty 1

## 2014-12-19 MED ORDER — HEPARIN SODIUM (PORCINE) 5000 UNIT/ML IJ SOLN
5000.0000 [IU] | Freq: Three times a day (TID) | INTRAMUSCULAR | Status: DC
  Administered 2014-12-19 – 2014-12-21 (×4): 5000 [IU] via SUBCUTANEOUS
  Filled 2014-12-19 (×4): qty 1

## 2014-12-19 MED ORDER — PRO-STAT SUGAR FREE PO LIQD
30.0000 mL | Freq: Three times a day (TID) | ORAL | Status: DC
  Administered 2014-12-19 – 2014-12-22 (×5): 30 mL via ORAL
  Filled 2014-12-19 (×15): qty 30

## 2014-12-19 MED ORDER — PANTOPRAZOLE SODIUM 40 MG PO TBEC
40.0000 mg | DELAYED_RELEASE_TABLET | Freq: Every day | ORAL | Status: DC
  Administered 2014-12-19 – 2014-12-22 (×4): 40 mg via ORAL
  Filled 2014-12-19 (×4): qty 1

## 2014-12-19 MED ORDER — MIDODRINE HCL 5 MG PO TABS
10.0000 mg | ORAL_TABLET | ORAL | Status: DC
  Filled 2014-12-19: qty 2

## 2014-12-19 MED ORDER — FERROUS SULFATE 325 (65 FE) MG PO TABS
325.0000 mg | ORAL_TABLET | Freq: Every day | ORAL | Status: DC
  Filled 2014-12-19 (×2): qty 1

## 2014-12-19 MED ORDER — METOPROLOL TARTRATE 50 MG PO TABS
50.0000 mg | ORAL_TABLET | Freq: Two times a day (BID) | ORAL | Status: DC
  Administered 2014-12-19 – 2014-12-22 (×6): 50 mg via ORAL
  Filled 2014-12-19 (×6): qty 1

## 2014-12-19 MED ORDER — HEPARIN (PORCINE) IN NACL 100-0.45 UNIT/ML-% IJ SOLN
1150.0000 [IU]/h | INTRAMUSCULAR | Status: DC
  Administered 2014-12-19: 1150 [IU]/h via INTRAVENOUS

## 2014-12-19 MED ORDER — HYDRALAZINE HCL 50 MG PO TABS
50.0000 mg | ORAL_TABLET | Freq: Two times a day (BID) | ORAL | Status: DC
  Administered 2014-12-19: 50 mg via ORAL
  Filled 2014-12-19: qty 2

## 2014-12-19 MED ORDER — ASPIRIN 81 MG PO CHEW
324.0000 mg | CHEWABLE_TABLET | Freq: Once | ORAL | Status: AC
  Administered 2014-12-19: 324 mg via ORAL
  Filled 2014-12-19: qty 4

## 2014-12-19 MED ORDER — SENNOSIDES-DOCUSATE SODIUM 8.6-50 MG PO TABS
1.0000 | ORAL_TABLET | Freq: Every day | ORAL | Status: DC
  Administered 2014-12-19 – 2014-12-22 (×3): 1 via ORAL
  Filled 2014-12-19 (×4): qty 1

## 2014-12-19 MED ORDER — ONDANSETRON HCL 4 MG/2ML IJ SOLN: 4.0000 mg | Freq: Three times a day (TID) | INTRAMUSCULAR | Status: DC | PRN

## 2014-12-19 MED ORDER — DIAZEPAM 5 MG PO TABS
2.5000 mg | ORAL_TABLET | Freq: Every evening | ORAL | Status: DC | PRN
  Administered 2014-12-19 – 2014-12-21 (×3): 2.5 mg via ORAL
  Filled 2014-12-19 (×3): qty 1

## 2014-12-19 NOTE — Progress Notes (Signed)
  Echocardiogram 2D Echocardiogram has been performed.  [name] James 12/19/2014, 2:54 PM

## 2014-12-19 NOTE — ED Notes (Signed)
Pre arrival info documented incorrectly, disregard pre arrival info. [place]

## 2014-12-19 NOTE — H&P (Signed)
[facility] History and Physical  [name] James [id number] DOB: 07-03-1936 DOA: 12/18/2014  Referring physician: ED physician PCP: Shirline Frees, MD  Specialists:   Chief Complaint: Chest pain and palpitation  HPI: [name] James is a 78 y.o. female with history of type 2 diabetes, hypertension, morbid obesity, ESRD on HD, who presents with chest pain and palpitations.  Patient is a poor historian. The history was obtained from his son and patient herself. Patient was recently started on dialysis via temp dialysis cath. She was discharged to a SNF. She had been doing fine until yesterday. During her dialysis yesterday, she started having palpitations and irregular heartbeat. Her rate was up to nearly 160. She also had chest pain. Her chest pains was located in the substernal area, 8 out of 10 in severity, sharp, radiating to both arms. She does not have fever or chills. She has chronic mild cough with very little mucus production.   She denies fever, chills, headaches, abdominal pain, diarrhea, constipation, dysuria, urgency, frequency, hematuria, skin rashes, or leg swelling.  Work up in the ED demonstrates elevated troponin 0.07. EKG showed nonspecific T-wave change in lead II and aVF. No leukocytosis. Potassium is 3.0.   Review of Systems: As presented in the history of presenting illness, rest negative.  Where does patient live?  SNF Can patient participate in ADLs? none  Allergy:  Allergies  Allergen Reactions  . Erythromycin Swelling  . Penicillins     She has taken keflex many times without porblems  . Codeine Rash    Past Medical History  Diagnosis Date  . CHF (congestive heart failure) 05/13/2011    echo - [id number]; stage 1 diastolic dysfunction; elevated LV filling pressure, MAC  . Chest pain, atypical 12/05/2008    R/Lmv- normal perfusion all regions, noe ECG changes   . Renal insufficiency 10/30/2005    doppler - abn resistance consistent w/ parenchymal  disease  . Claudication 10/30/2005    doppler - normal evaluation, no evidence of aneurysm, diameter reduction, dissection, compression or vascular abnormality)  . DM (diabetes mellitus)   . Peripheral neuropathy   . Hypertension   . Edema   . Gout   . Anxiety   . Arthritis   . Cataract   . GERD (gastroesophageal reflux disease)   . Breast cancer 1980s    bil mastectomies, no radiation or chemo    Past Surgical History  Procedure Laterality Date  . Mastectomy Bilateral ~1982    bil breast reconstruction with implants  . Back surgery  ~1980  . Hemorrhoid surgery    . Cholecystectomy    . Eye surgery    . Orif tibia & fibula fractures Left 2007    also had left non displaced malleolar fracture.   . Esophagogastroduodenoscopy N/A 10/24/2014    Procedure: ESOPHAGOGASTRODUODENOSCOPY (EGD);  Surgeon: Jerene Bears, MD;  Location[facility] END[facility];  Service: Endoscopy;  Laterality: N/A;  . Av fistula placement Left 11/30/2014    Procedure: ARTERIOVENOUS (AV) FISTULA CREATION LEFT ARM;  Surgeon: Angelia Mould, MD;  Location: [place];  Service: Vascular;  Laterality: Left;  . Exchange of a dialysis catheter Left 11/30/2014    Procedure: EXCHANGE OF A DIALYSIS CATHETER, LEFT INTERNAL JUGULAR;  Surgeon: Angelia Mould, MD;  Location: [place];  Service: Vascular;  Laterality: Left;    Social History:  reports that she has never smoked. She does not have any smokeless tobacco history on file. She reports that she does not drink  alcohol. Her drug history is not on file.  Family History:  Family History  Problem Relation Age of Onset  . Cancer Mother   . Diabetes Mother   . Heart disease Mother   . Heart attack Father   . Stroke Father   . Parkinson's disease Brother   . Cancer Sister      Prior to Admission medications   Medication Sig Start Date End Date Taking? Authorizing Provider  Amino Acids-Protein Hydrolys (FEEDING SUPPLEMENT, PRO-STAT SUGAR FREE 64,) LIQD Take 30 mLs  by mouth 3 (three) times daily with meals.   Yes Historical Provider, MD  b complex-vitamin c-folic acid (NEPHRO-VITE) 0.8 MG TABS tablet Take 1 tablet by mouth at bedtime.   Yes Historical Provider, MD  calcium acetate (PHOSLO) 667 MG capsule Take 1 capsule (667 mg total) by mouth 3 (three) times daily with meals. 11/02/14  Yes Geradine Girt, DO  Darbepoetin Alfa (ARANESP) 100 MCG/0.5ML SOSY injection Inject 0.5 mLs (100 mcg total) into the skin every Friday at 6 PM. Patient taking differently: Inject 100 mcg into the skin every Wednesday.  11/02/14  Yes Geradine Girt, DO  diazepam (VALIUM) 5 MG tablet Take 1/2 tablet by mouth once at bedtime for rest 12/07/14  Yes Lauree Chandler, NP  dorzolamide-timolol (COSOPT) 22.3-6.8 MG/ML ophthalmic solution Place 1 drop into the left eye 2 (two) times daily.    Yes Historical Provider, MD  ferrous sulfate 325 (65 FE) MG tablet Take 325 mg by mouth daily with breakfast.   Yes Historical Provider, MD  folic acid (FOLVITE) 1 MG tablet Take 1 mg by mouth daily.   Yes Historical Provider, MD  heparin 5000 UNIT/ML injection Inject 5,000 Units into the skin every 12 (twelve) hours.   Yes Historical Provider, MD  insulin aspart (NOVOLOG) 100 UNIT/ML injection Inject 5 Units into the skin 3 (three) times daily before meals.   Yes Historical Provider, MD  insulin aspart (NOVOLOG) 100 UNIT/ML injection Inject 0-9 Units into the skin 3 (three) times daily before meals. Sliding scale   Yes Historical Provider, MD  insulin detemir (LEVEMIR) 100 UNIT/ML injection Inject 20 Units into the skin at bedtime.   Yes Historical Provider, MD  loratadine (CLARITIN) 10 MG tablet Take 10 mg by mouth daily.   Yes Historical Provider, MD  megestrol (MEGACE) 40 MG/ML suspension Take 40 mg by mouth daily.   Yes Historical Provider, MD  Melatonin 3 MG TABS Take 6 mg by mouth at bedtime.   Yes Historical Provider, MD  metoprolol (LOPRESSOR) 50 MG tablet Take 50 mg by mouth 2 (two)  times daily.   Yes Historical Provider, MD  midodrine (PROAMATINE) 10 MG tablet Take 10 mg by mouth every Monday, Wednesday, and Friday.   Yes Historical Provider, MD  pantoprazole (PROTONIX) 40 MG tablet Take 1 tablet (40 mg total) by mouth daily. 11/02/14  Yes Geradine Girt, DO  promethazine (PHENERGAN) 25 MG tablet Take 25 mg by mouth 2 (two) times daily as needed for nausea or vomiting.   Yes Historical Provider, MD  sennosides-docusate sodium (SENOKOT-S) 8.6-50 MG tablet Take 1 tablet by mouth daily.   Yes Historical Provider, MD  sertraline (ZOLOFT) 100 MG tablet Take 100 mg by mouth daily.   Yes Historical Provider, MD  vitamin C (ASCORBIC ACID) 500 MG tablet Take 500 mg by mouth 2 (two) times daily.   Yes Historical Provider, MD  enoxaparin (LOVENOX) 30 MG/0.3ML injection Inject 0.3 mLs (30 mg total)  into the skin daily. 11/02/14   Geradine Girt, DO  furosemide (LASIX) 80 MG tablet Take 2 tablets (160 mg total) by mouth 3 (three) times daily. 11/02/14   Geradine Girt, DO  hydrALAZINE (APRESOLINE) 20 MG/ML injection Inject 0.5 mLs (10 mg total) into the vein every 6 (six) hours as needed (IF sbp >160 mmHg). 11/02/14   Geradine Girt, DO  hydrALAZINE (APRESOLINE) 25 MG tablet Take 3 tablets (75 mg total) by mouth every 8 (eight) hours. 11/02/14   Geradine Girt, DO  insulin aspart (NOVOLOG) 100 UNIT/ML injection Inject 0-9 Units into the skin 3 (three) times daily with meals. 11/02/14   Geradine Girt, DO  insulin aspart (NOVOLOG) 100 UNIT/ML injection Inject 5 Units into the skin 3 (three) times daily with meals. 11/02/14   Geradine Girt, DO  insulin glargine (LANTUS) 100 UNIT/ML injection Inject 0.24 mLs (24 Units total) into the skin at bedtime. 11/02/14   Geradine Girt, DO  Nutritional Supplements (FEEDING SUPPLEMENT, NEPRO CARB STEADY,) LIQD Take 237 mLs by mouth daily. 11/02/14   Geradine Girt, DO  ondansetron (ZOFRAN) 4 MG/2ML SOLN injection Inject 2 mLs (4 mg total) into the vein  every 6 (six) hours as needed for nausea. 11/02/14   Geradine Girt, DO  oxyCODONE (OXY IR/ROXICODONE) 5 MG immediate release tablet Take 0.5 tablets (2.5 mg total) by mouth every 4 (four) hours as needed for severe pain. 11/02/14   Geradine Girt, DO  polyethylene glycol (MIRALAX / GLYCOLAX) packet Take 17 g by mouth daily as needed for mild constipation. 11/02/14   Geradine Girt, DO  potassium chloride SA (K-DUR,KLOR-CON) 20 MEQ tablet Take 1 tablet (20 mEq total) by mouth daily. 11/02/14   Geradine Girt, DO  simvastatin (ZOCOR) 20 MG tablet Take 20 mg by mouth every evening.    Historical Provider, MD    Physical Exam: Filed Vitals:   12/19/14 0130 12/19/14 0200 12/19/14 0230 12/19/14 0346  BP: 146/48 142/52 103/47 138/48  Pulse: 82 82 83 85  Temp:      TempSrc:      Resp: 22 23 25 22   Weight:      SpO2: 100% 100% 99% 100%   General: Not in acute distress HEENT:       Eyes: PERRL, EOMI, no scleral icterus       ENT: No discharge from the ears and nose, no pharynx injection, no tonsillar enlargement.        Neck: No JVD, no bruit, no mass felt. Cardiac: S1/S2, RRR, No murmurs, No gallops or rubs Pulm: Good air movement bilaterally. Clear to auscultation bilaterally. No rales, wheezing, rhonchi or rubs. Abd: Soft, nondistended, nontender, no rebound pain, no organomegaly, BS present Ext: No edema bilaterally. 2+DP/PT pulse bilaterally Musculoskeletal: No joint deformities, erythema, or stiffness, ROM full Skin: No rashes.  Neuro: Alert and oriented X3, cranial nerves II-XII grossly intact, muscle strength 5/5 in all extremeties, sensation to light touch intact. Brachial reflex 2+ bilaterally. Knee reflex 1+ bilaterally.  Psych: Patient is not psychotic, no suicidal or hemocidal ideation.  Labs on Admission:  Basic Metabolic Panel:  Recent Labs Lab 12/18/14 1918  NA 141  K 3.0*  CL 102  CO2 27  GLUCOSE 166*  BUN 24*  CREATININE 3.54*  CALCIUM 8.3*   Liver Function  Tests: No results for input(s): AST, ALT, ALKPHOS, BILITOT, PROT, ALBUMIN in the last 168 hours. No results for input(s): LIPASE, AMYLASE in  the last 168 hours. No results for input(s): AMMONIA in the last 168 hours. CBC:  Recent Labs Lab 12/18/14 1918  WBC 9.5  NEUTROABS 4.8  HGB 11.2*  HCT 37.6  MCV 98.2  PLT 159   Cardiac Enzymes:  Recent Labs Lab 12/18/14 1918  TROPONINI 0.07*    BNP (last 3 results)  Recent Labs  11/03/14 0500 11/08/14 0714 11/13/14 0500  PROBNP 1032.0* 1198.0* 4493.0*   CBG: No results for input(s): GLUCAP in the last 168 hours.  Radiological Exams on Admission: Dg Chest Port 1 View  12/18/2014   CLINICAL DATA:  Irregular heart rate at dialysis today, hand tremor. History CHF, renal insufficiency, breast cancer.  EXAM: PORTABLE CHEST - 1 VIEW  COMPARISON:  Chest radiograph November 30, 2014  FINDINGS: Cardiac silhouette is unremarkable and unchanged. Mildly calcified aortic knob. Hazy density in the LEFT lung base. Tunneled dialysis catheter via LEFT internal jugular venous approach with distal tips in the cavoatrial junction, unchanged. No pneumothorax. Soft tissue planes and included osseous structures are nonsuspicious. Interval removal of RIGHT internal jugular intravenous line.  IMPRESSION: Hazy density in LEFT lung base may reflect small layering pleural effusion, no frank pulmonary edema on today's examination.   Electronically Signed   By: Elon Alas   On: 12/18/2014 19:59    EKG: Independently reviewed. showed nonspecific T-wave change in lead II and aVF.  Assessment/Plan Principal Problem:   Chest pain Active Problems:   HTN (hypertension)   DM type 2, uncontrolled, with renal complications   Dyslipidemia   Iron deficiency anemia   Hemodialysis-associated hypotension   ESRD (end stage renal disease) on dialysis   CHF (congestive heart failure)   Palpitation   ESRD (end stage renal disease)  Chest pain and Elevated  troponins: EKG showed nonspecific T-wave flattening in lead 2 and aVF. Initial troponin was slightly elevated at 0.07. Cardiology was consulted given significant risk factors for ACS.  Will admit tele bed, follow up cardiologist recommendations to follows.  -- NPO after midnight for stress vs. Cath depending on the cardiac biomarker trend. --  limited TTE to evaluate LVEF/wall motion abnormalities.   -- OK to start ACS nomogram heparin gtt -- Continue aspirin -- changing the statin therapy to high intensity statin. lipitor 80 mg daily -- Cycle cardiac enzymes x 3 -will continue BB and nitroglycerin when necessary  Palpitations: She has had previous mention of afib in previous cardiology notes; She may have had a burst of SVT and NSVT which happened in the past per card, should it be afib, patient was thought not to be a candidate for systemic anticoagulation in the past. Patient med list has levenox, but she is not clear whether she is getting levnox or not (patient will be on heparin gtt). - Follow cardiology's recommendations follows: - Correct electrolyte imbalance to keep K>4 and Mg>2 - Monitor on tele.    ESRD--HD (M/W/F). Patient started dialysis recently (about 4 weeks ago). just completed HD yesterday.  -left message to renal for HD  DM-II: Last A1c was 6.1 on 11/09/14. Patient is on Lantus at home. -Decreased lantus dose from 24 units to 15 units. -Sliding-scale insulin.  CHF: 2-D echo on 10/18/14 showed EF of 60-65% with grade 1 diastolic dysfunction. Patient does not have leg edema on admission. She seems to have euvolemic status. -defer to renal for volume management.  HTN: - Continue metoprolol and hydralazine  Hypokalemia: Potassium is 3.0 on admission -Repleted with 40 mEq of potassium chloride  -  repeat BMP in the morning  DVT ppx: on Heparin gtt Code Status: Full code ( discussed with patient, she is not ready to answer this question). Family Communication:   Yes,  patient's  son     at bed side Disposition Plan: Admit to inpatient   Date of Service 12/19/2014    Ivor Costa [facility] Pager [phone]  If 7PM-7AM, please contact night-coverage [email or web address] Password [email or web address] 12/19/2014, 4:01 AM

## 2014-12-19 NOTE — Progress Notes (Signed)
ANTICOAGULATION CONSULT NOTE - Initial Consult  Pharmacy Consult for Heparin  Indication: chest pain/ACS  Allergies  Allergen Reactions  . Erythromycin Swelling  . Penicillins     She has taken keflex many times without porblems  . Codeine Rash    Patient Measurements: Weight: 224 lb 13.9 oz (102 kg) Heparin Dosing Weight: ~76  Vital Signs: Temp: 97.7 F (36.5 C) (12/28 1634) Temp Source: Oral (12/28 1634) BP: 138/48 mmHg (12/29 0346) Pulse Rate: 85 (12/29 0346)  Labs:  Recent Labs  12/18/14 1918  HGB 11.2*  HCT 37.6  PLT 159  CREATININE 3.54*  TROPONINI 0.07*   Medical History: Past Medical History  Diagnosis Date  . CHF (congestive heart failure) 05/13/2011    echo - [id number]; stage 1 diastolic dysfunction; elevated LV filling pressure, MAC  . Chest pain, atypical 12/05/2008    R/Lmv- normal perfusion all regions, noe ECG changes   . Renal insufficiency 10/30/2005    doppler - abn resistance consistent w/ parenchymal disease  . Claudication 10/30/2005    doppler - normal evaluation, no evidence of aneurysm, diameter reduction, dissection, compression or vascular abnormality)  . DM (diabetes mellitus)   . Peripheral neuropathy   . Hypertension   . Edema   . Gout   . Anxiety   . Arthritis   . Cataract   . GERD (gastroesophageal reflux disease)   . Breast cancer 1980s    bil mastectomies, no radiation or chemo   Assessment: 78 y/o F to start heparin for minimally elevated troponin. Hgb 11.2, CKD with CrCl <20, other labs as above. Was on sub-cutaneous heparin PTA per [facility].   Goal of Therapy:  Heparin level 0.3-0.7 units/ml Monitor platelets by anticoagulation protocol: Yes   Plan:  -Heparin 2000 units BOLUS -Start heparin drip at 900 units/hr -1300 HL -Daily CBC/HL -Monitor for bleeding  Narda Bonds 12/19/2014,4:14 AM

## 2014-12-19 NOTE — Consult Note (Signed)
CARDIOLOGY ADMISSION/CONSULT NOTE  Assessment and Plan:  *Elevated troponins: [name] James is a pleasant 78 year old lady with history of type II DM, HTN, morbid obesity, ESRD on HD comes to the ED mainly with irregular heart beat but then ED with was maybe complaining of CP. Unfortunately, patient is fairly somnolent currently and is unable to provide a clear history. However, patient has many risk factors of CAD. Objectively, patient has a normal EKG without any evidence of ischemia. Her first set of troponin was minimally elevated to 0.07. The differential includes chronic elevation due to ESRD vs. Demand ischemia vs low possibility of ACS.   We recommend: -- NPO s midnight for stress vs. Cath depending on the cardiac biomarker trend. -- Would recommend a limited TTE to evaluate LVEF/wall motion abnormalities.  -- OK to start ACS nomogram heparin gtt -- Continue aspirin -- Would recommend changing the statin therapy to high intensity statin -- Monitor on tele. -- Cycle cardiac enzymes x 3  *Palpitations: Patient reportedly was having symptoms of palpitations. She is currently in sinus rhythm. She has had previous mention of afib in previous cardiology notes; although, I do not see EKG/tele evidence of it. She has had burst of atach/SVT and NSVT in the past. Certainly, today she could be having recurrence of those. Should it be afib, patient was thought not to be a candidate for systemic anticoagulation in the past.   We recommend: -- Correct electrolyte imbalance to keep K>4 and Mg>2 -- Monitor on tele.   Chief complaint: Elevated troponins, palpitations, chest pain  HPI:  [name] James is a 78 year old female with history of type 2 diabetes, hypertension, morbid obesity, ESRD on HD, family history of coronary artery disease is currently admitted to the hospitalist service for evaluation of chest pain and palpitations.  Patient is a poor historian and most of the history is obtained  from the charts. Patient recently has had 2 admissions of volume overload due to progressive renal dysfunction in November. She had a lengthy admission then and cardiology was consulted for evaluation of volume OD. Patient was eventually started on dialysis via temp dialysis cath. She was discharged to a SNF. She had been doing ok more recently until today during 3 hrs in dialysis (usually 4 hrs MWF) she was noted to have "irregular heart beat with HR ranging from 60-120s. She endorses no other symptoms at the time except feeling fatigued. She was brought to the ED when complained of chest pressure around 7 PM. She had an EKG that showed no concerning changes for ischemia. Cardiology was consulted when troponins levels returned elevated at 0.07.   She endorses some vague chest pain while at the facility during rehab that improves with rest. Although it is difficult to illicit any history from her at this time as she continues to endorse being tired.   Cardiac history:  Type 2 diabetes  Hypertension  Chronic kidney disease  Advanced age Previous cardiac imaging  EKG: Normal sinus rhythm  TTE 10/18/2014: Normal LVEF 60-65%, mild LVH, grade 1 diastolic dysfunction, mitral annular calcification, trivial aortic insufficiency, normal RV function.  NM Stress test 12/05/2008: Normal perfusion scan.  Prior cath:  None  Past Medical History Past Medical History  Diagnosis Date  . CHF (congestive heart failure) 05/13/2011    echo - [id number]; stage 1 diastolic dysfunction; elevated LV filling pressure, MAC  . Chest pain, atypical 12/05/2008    R/Lmv- normal perfusion all regions, noe ECG changes   .  Renal insufficiency 10/30/2005    doppler - abn resistance consistent w/ parenchymal disease  . Claudication 10/30/2005    doppler - normal evaluation, no evidence of aneurysm, diameter reduction, dissection, compression or vascular abnormality)  . DM (diabetes mellitus)   . Peripheral neuropathy   .  Hypertension   . Edema   . Gout   . Anxiety   . Arthritis   . Cataract   . GERD (gastroesophageal reflux disease)   . Breast cancer 1980s    bil mastectomies, no radiation or chemo    Allergies: Allergies  Allergen Reactions  . Erythromycin Swelling  . Penicillins     She has taken keflex many times without porblems  . Codeine Rash    Social History History   Social History  . Marital Status: Divorced    Spouse Name: N/A    Number of Children: N/A  . Years of Education: N/A   Occupational History  . Not on file.   Social History Main Topics  . Smoking status: Never Smoker   . Smokeless tobacco: Not on file  . Alcohol Use: No  . Drug Use: Not on file  . Sexual Activity: Not on file   Other Topics Concern  . Not on file   Social History Narrative    Family History Family History  Problem Relation Age of Onset  . Cancer Mother   . Diabetes Mother   . Heart disease Mother   . Heart attack Father   . Stroke Father   . Parkinson's disease Brother   . Cancer Sister     Physical Exam Filed Vitals:   12/19/14 0102  BP: 131/51  Pulse: 83  Temp:   Resp: 21    Gen: Appears pale and sleepy HEENT: EOMI, MMM, normocephalic Neck: Suppler, JVP at 10cm but limited exam due to body habitus, no carotid bruits CV: RRR, Normal S1 and S2. II/VI systolic murmur best heard at the RUSB, +2 pulses in bilateral UE. Left UE with AV fistula Pulm: CTAB Anteriorly, normal WOB  Abdomen: Morbidly obese, normal bowel sounds Ext: No c/c/e.   Labs:  Results for orders placed or performed during the hospital encounter of 12/18/14 (from the past 24 hour(s))  CBC with Differential     Status: Abnormal   Collection Time: 12/18/14  7:18 PM  Result Value Ref Range   WBC 9.5 4.0 - 10.5 K/uL   RBC 3.83 (L) 3.87 - 5.11 MIL/uL   Hemoglobin 11.2 (L) 12.0 - 15.0 g/dL   HCT 37.6 36.0 - 46.0 %   MCV 98.2 78.0 - 100.0 fL   MCH 29.2 26.0 - 34.0 pg   MCHC 29.8 (L) 30.0 - 36.0 g/dL    RDW 17.0 (H) 11.5 - 15.5 %   Platelets 159 150 - 400 K/uL   Neutrophils Relative % 51 43 - 77 %   Neutro Abs 4.8 1.7 - 7.7 K/uL   Lymphocytes Relative 38 12 - 46 %   Lymphs Abs 3.7 0.7 - 4.0 K/uL   Monocytes Relative 9 3 - 12 %   Monocytes Absolute 0.9 0.1 - 1.0 K/uL   Eosinophils Relative 2 0 - 5 %   Eosinophils Absolute 0.2 0.0 - 0.7 K/uL   Basophils Relative 0 0 - 1 %   Basophils Absolute 0.0 0.0 - 0.1 K/uL  Basic metabolic panel     Status: Abnormal   Collection Time: 12/18/14  7:18 PM  Result Value Ref Range   Sodium 141  135 - 145 mmol/L   Potassium 3.0 (L) 3.5 - 5.1 mmol/L   Chloride 102 96 - 112 mEq/L   CO2 27 19 - 32 mmol/L   Glucose, Bld 166 (H) 70 - 99 mg/dL   BUN 24 (H) 6 - 23 mg/dL   Creatinine, Ser 3.54 (H) 0.50 - 1.10 mg/dL   Calcium 8.3 (L) 8.4 - 10.5 mg/dL   GFR calc non Af Amer 12 (L) >90 mL/min   GFR calc Af Amer 13 (L) >90 mL/min   Anion gap 12 5 - 15  Troponin I     Status: Abnormal   Collection Time: 12/18/14  7:18 PM  Result Value Ref Range   Troponin I 0.07 (H) <0.031 ng/mL

## 2014-12-19 NOTE — ED Notes (Signed)
Assisted Abigail Butts, RN with rolling pt to the side to access her backside and resituate the pt on the stretcher; family at bedside

## 2014-12-19 NOTE — Progress Notes (Addendum)
Patient with IV infiltration on left arm with IV heparin and NS.  Pressure held for 15 minutes and vaseline gauze and pressure dressing applied.  Medium sized hematoma noted.  Dr. Hartford Poli and Dr. Sallyanne Kuster paged and notified, orders received to d/c IV heparin and change to SQ heparin.  IV team requested for IV replacement.  Will continue to monitor.  Sanda Linger

## 2014-12-19 NOTE — Progress Notes (Signed)
No angina since admission. Low risk ECG. Marginal elevation in troponin ina a"plateau" pattern. I think better suited for noninvasive evaluation with Lexiscan Myoview (note normal nuclear scan in 2009).  Sanda Klein, MD, [facility] [phone] office [phone] pager

## 2014-12-19 NOTE — Consult Note (Addendum)
[facility] Renal Consultation Note  Indication for Consultation:  Management of ESRD/hemodialysis; anemia, hypertension/volume and secondary hyperparathyroidism  HPI: [name] James is a 78 y.o. female admitted yesterday with Chest pain  And tachycardia (up to 160) With symptoms occuring at OP Kidney center ( [facility]). IN ED=  elevated troponin to 0.07 with nonspecific  Twave changes and K = 3.0 however pt just had OP HD.Marland Kitchen She recently was admitted here from 10/27 > 11/02/14, discharged to [facility] and was started on HD while at [facility].  Then was dc'd from [facility] to SNF about 1 week ago and has been getting OP HD at [facility].  Per daughter making "good progress with PT at [facility]" Energy level improved since HD started per daughter and Pt. Currently in ER without CP awaiting room.  Chart review: 9/07 - 76 yo w DM, HTN, CKD fell while getting in her car and had L ankle fracture nondisplaced. Treated with splinting and pt was sent to SNF for further care as she was unable to care for herself at home.  She had knee pain and f/u xrays showed a bicondylar plateau fracture with varus deformity. Underwent ORIF of L medial tibial plateau fracture w screws/ buttress plate and bone grafting.  Complicated by acute on CRF. She got coumadin for DVT prophylaxis.  She had afib transient issue rx with diltiazem. VQ was negative. AKI improved. She worked with PT and had limited capacities, pt was "quite debilitated" per DC summary at time of dc. DC"d back to SNF.    10/27 - 11/02/14 > 41 yo with CKD stabe 3/4 , DM, HTN, presented with fatigue and worsening LE edema, DOE.  Dx was acute on chronic diast CHF, bilat effusion w pulm edema. She diuresed 14 liters during stay.  Acute on CRF baseline 2.3 Sept 2015 . Creat was 4.0 on admit,k peaked at 5.0, then down to 3.48 when dc'd to [facility] on Nov 02, 2014.  Dialysis was not required but conversations regarding possible need for HD  were had.     Past Medical History  Diagnosis Date  . CHF (congestive heart failure) 05/13/2011    echo - QI>34%; stage 1 diastolic dysfunction; elevated LV filling pressure, MAC  . Chest pain, atypical 12/05/2008    R/Lmv- normal perfusion all regions, noe ECG changes   . Renal insufficiency 10/30/2005    doppler - abn resistance consistent w/ parenchymal disease  . Claudication 10/30/2005    doppler - normal evaluation, no evidence of aneurysm, diameter reduction, dissection, compression or vascular abnormality)  . DM (diabetes mellitus)   . Peripheral neuropathy   . Hypertension   . Edema   . Gout   . Anxiety   . Arthritis   . Cataract   . GERD (gastroesophageal reflux disease)   . Breast cancer 1980s    bil mastectomies, no radiation or chemo    Past Surgical History  Procedure Laterality Date  . Mastectomy Bilateral ~1982    bil breast reconstruction with implants  . Back surgery  ~1980  . Hemorrhoid surgery    . Cholecystectomy    . Eye surgery    . Orif tibia & fibula fractures Left 2007    also had left non displaced malleolar fracture.   . Esophagogastroduodenoscopy N/A 10/24/2014    Procedure: ESOPHAGOGASTRODUODENOSCOPY (EGD);  Surgeon: Jerene Bears, MD;  Location: [facility] ENDOSCOPY;  Service: Endoscopy;  Laterality: N/A;  . Av fistula placement Left  11/30/2014    Procedure: ARTERIOVENOUS (AV) FISTULA CREATION LEFT ARM;  Surgeon: Angelia Mould, MD;  Location: [place];  Service: Vascular;  Laterality: Left;  . Exchange of a dialysis catheter Left 11/30/2014    Procedure: EXCHANGE OF A DIALYSIS CATHETER, LEFT INTERNAL JUGULAR;  Surgeon: Angelia Mould, MD;  Location: [facility] OR;  Service: Vascular;  Laterality: Left;      Family History  Problem Relation Age of Onset  . Cancer Mother   . Diabetes Mother   . Heart disease Mother   . Heart attack Father   . Stroke Father   . Parkinson's disease Brother   . Cancer Sister       reports that she has never  smoked. She does not have any smokeless tobacco history on file. She reports that she does not drink alcohol. Her drug history is not on file.   Allergies  Allergen Reactions  . Erythromycin Swelling  . Penicillins     She has taken keflex many times without porblems  . Codeine Rash    Prior to Admission medications   Medication Sig Start Date End Date Taking? Authorizing Provider  Amino Acids-Protein Hydrolys (FEEDING SUPPLEMENT, PRO-STAT SUGAR FREE 64,) LIQD Take 30 mLs by mouth 3 (three) times daily with meals.   Yes Historical Provider, MD  b complex-vitamin c-folic acid (NEPHRO-VITE) 0.8 MG TABS tablet Take 1 tablet by mouth at bedtime.   Yes Historical Provider, MD  calcium acetate (PHOSLO) 667 MG capsule Take 1 capsule (667 mg total) by mouth 3 (three) times daily with meals. 11/02/14  Yes Geradine Girt, DO  Darbepoetin Alfa (ARANESP) 100 MCG/0.5ML SOSY injection Inject 0.5 mLs (100 mcg total) into the skin every Friday at 6 PM. Patient taking differently: Inject 100 mcg into the skin every Wednesday.  11/02/14  Yes Geradine Girt, DO  diazepam (VALIUM) 5 MG tablet Take 1/2 tablet by mouth once at bedtime for rest 12/07/14  Yes Lauree Chandler, NP  dorzolamide-timolol (COSOPT) 22.3-6.8 MG/ML ophthalmic solution Place 1 drop into the left eye 2 (two) times daily.    Yes Historical Provider, MD  ferrous sulfate 325 (65 FE) MG tablet Take 325 mg by mouth daily with breakfast.   Yes Historical Provider, MD  folic acid (FOLVITE) 1 MG tablet Take 1 mg by mouth daily.   Yes Historical Provider, MD  heparin 5000 UNIT/ML injection Inject 5,000 Units into the skin every 12 (twelve) hours.   Yes Historical Provider, MD  insulin aspart (NOVOLOG) 100 UNIT/ML injection Inject 5 Units into the skin 3 (three) times daily before meals.   Yes Historical Provider, MD  insulin aspart (NOVOLOG) 100 UNIT/ML injection Inject 0-9 Units into the skin 3 (three) times daily before meals. Sliding scale   Yes  Historical Provider, MD  insulin detemir (LEVEMIR) 100 UNIT/ML injection Inject 20 Units into the skin at bedtime.   Yes Historical Provider, MD  loratadine (CLARITIN) 10 MG tablet Take 10 mg by mouth daily.   Yes Historical Provider, MD  megestrol (MEGACE) 40 MG/ML suspension Take 40 mg by mouth daily.   Yes Historical Provider, MD  Melatonin 3 MG TABS Take 6 mg by mouth at bedtime.   Yes Historical Provider, MD  metoprolol (LOPRESSOR) 50 MG tablet Take 50 mg by mouth 2 (two) times daily.   Yes Historical Provider, MD  midodrine (PROAMATINE) 10 MG tablet Take 10 mg by mouth every Monday, Wednesday, and Friday.   Yes Historical Provider, MD  pantoprazole (PROTONIX) 40 MG tablet Take 1 tablet (40 mg total) by mouth daily. 11/02/14  Yes Geradine Girt, DO  promethazine (PHENERGAN) 25 MG tablet Take 25 mg by mouth 2 (two) times daily as needed for nausea or vomiting.   Yes Historical Provider, MD  sennosides-docusate sodium (SENOKOT-S) 8.6-50 MG tablet Take 1 tablet by mouth daily.   Yes Historical Provider, MD  sertraline (ZOLOFT) 100 MG tablet Take 100 mg by mouth daily.   Yes Historical Provider, MD  vitamin C (ASCORBIC ACID) 500 MG tablet Take 500 mg by mouth 2 (two) times daily.   Yes Historical Provider, MD  enoxaparin (LOVENOX) 30 MG/0.3ML injection Inject 0.3 mLs (30 mg total) into the skin daily. 11/02/14   Geradine Girt, DO  furosemide (LASIX) 80 MG tablet Take 2 tablets (160 mg total) by mouth 3 (three) times daily. 11/02/14   Geradine Girt, DO  hydrALAZINE (APRESOLINE) 20 MG/ML injection Inject 0.5 mLs (10 mg total) into the vein every 6 (six) hours as needed (IF sbp >160 mmHg). 11/02/14   Geradine Girt, DO  hydrALAZINE (APRESOLINE) 25 MG tablet Take 3 tablets (75 mg total) by mouth every 8 (eight) hours. 11/02/14   Geradine Girt, DO  insulin aspart (NOVOLOG) 100 UNIT/ML injection Inject 0-9 Units into the skin 3 (three) times daily with meals. 11/02/14   Geradine Girt, DO  insulin  aspart (NOVOLOG) 100 UNIT/ML injection Inject 5 Units into the skin 3 (three) times daily with meals. 11/02/14   Geradine Girt, DO  insulin glargine (LANTUS) 100 UNIT/ML injection Inject 0.24 mLs (24 Units total) into the skin at bedtime. 11/02/14   Geradine Girt, DO  Nutritional Supplements (FEEDING SUPPLEMENT, NEPRO CARB STEADY,) LIQD Take 237 mLs by mouth daily. 11/02/14   Geradine Girt, DO  ondansetron (ZOFRAN) 4 MG/2ML SOLN injection Inject 2 mLs (4 mg total) into the vein every 6 (six) hours as needed for nausea. 11/02/14   Geradine Girt, DO  oxyCODONE (OXY IR/ROXICODONE) 5 MG immediate release tablet Take 0.5 tablets (2.5 mg total) by mouth every 4 (four) hours as needed for severe pain. 11/02/14   Geradine Girt, DO  polyethylene glycol (MIRALAX / GLYCOLAX) packet Take 17 g by mouth daily as needed for mild constipation. 11/02/14   Geradine Girt, DO  potassium chloride SA (K-DUR,KLOR-CON) 20 MEQ tablet Take 1 tablet (20 mEq total) by mouth daily. 11/02/14   Geradine Girt, DO  simvastatin (ZOCOR) 20 MG tablet Take 20 mg by mouth every evening.    Historical Provider, MD     Anti-infectives    None      Results for orders placed or performed during the hospital encounter of 12/18/14 (from the past 48 hour(s))  CBC with Differential     Status: Abnormal   Collection Time: 12/18/14  7:18 PM  Result Value Ref Range   WBC 9.5 4.0 - 10.5 K/uL   RBC 3.83 (L) 3.87 - 5.11 MIL/uL   Hemoglobin 11.2 (L) 12.0 - 15.0 g/dL   HCT 37.6 36.0 - 46.0 %   MCV 98.2 78.0 - 100.0 fL   MCH 29.2 26.0 - 34.0 pg   MCHC 29.8 (L) 30.0 - 36.0 g/dL   RDW 17.0 (H) 11.5 - 15.5 %   Platelets 159 150 - 400 K/uL   Neutrophils Relative % 51 43 - 77 %   Neutro Abs 4.8 1.7 - 7.7 K/uL   Lymphocytes Relative 38 12 -  46 %   Lymphs Abs 3.7 0.7 - 4.0 K/uL   Monocytes Relative 9 3 - 12 %   Monocytes Absolute 0.9 0.1 - 1.0 K/uL   Eosinophils Relative 2 0 - 5 %   Eosinophils Absolute 0.2 0.0 - 0.7 K/uL   Basophils  Relative 0 0 - 1 %   Basophils Absolute 0.0 0.0 - 0.1 K/uL  Basic metabolic panel     Status: Abnormal   Collection Time: 12/18/14  7:18 PM  Result Value Ref Range   Sodium 141 135 - 145 mmol/L    Comment: Please note change in reference range.   Potassium 3.0 (L) 3.5 - 5.1 mmol/L    Comment: Please note change in reference range.   Chloride 102 96 - 112 mEq/L   CO2 27 19 - 32 mmol/L   Glucose, Bld 166 (H) 70 - 99 mg/dL   BUN 24 (H) 6 - 23 mg/dL   Creatinine, Ser 3.54 (H) 0.50 - 1.10 mg/dL   Calcium 8.3 (L) 8.4 - 10.5 mg/dL   GFR calc non Af Amer 12 (L) >90 mL/min   GFR calc Af Amer 13 (L) >90 mL/min    Comment: (NOTE) The eGFR has been calculated using the CKD EPI equation. This calculation has not been validated in all clinical situations. eGFR's persistently <90 mL/min signify possible Chronic Kidney Disease.    Anion gap 12 5 - 15  Troponin I     Status: Abnormal   Collection Time: 12/18/14  7:18 PM  Result Value Ref Range   Troponin I 0.07 (H) <0.031 ng/mL    Comment:        PERSISTENTLY INCREASED TROPONIN VALUES IN THE RANGE OF 0.04-0.49 ng/mL CAN BE SEEN IN:       -UNSTABLE ANGINA       -CONGESTIVE HEART FAILURE       -MYOCARDITIS       -CHEST TRAUMA       -ARRYHTHMIAS       -LATE PRESENTING MYOCARDIAL INFARCTION       -COPD   CLINICAL FOLLOW-UP RECOMMENDED. Please note change in reference range.   Magnesium     Status: None   Collection Time: 12/19/14  5:01 AM  Result Value Ref Range   Magnesium 2.1 1.5 - 2.5 mg/dL  Troponin I (q 6hr x 3)     Status: Abnormal   Collection Time: 12/19/14  5:01 AM  Result Value Ref Range   Troponin I 0.09 (H) <0.031 ng/mL    Comment:        PERSISTENTLY INCREASED TROPONIN VALUES IN THE RANGE OF 0.04-0.49 ng/mL CAN BE SEEN IN:       -UNSTABLE ANGINA       -CONGESTIVE HEART FAILURE       -MYOCARDITIS       -CHEST TRAUMA       -ARRYHTHMIAS       -LATE PRESENTING MYOCARDIAL INFARCTION       -COPD   CLINICAL FOLLOW-UP  RECOMMENDED. Please note change in reference range.   Protime-INR     Status: None   Collection Time: 12/19/14  7:50 AM  Result Value Ref Range   Prothrombin Time 14.8 11.6 - 15.2 seconds   INR 1.14 0.00 - 1.70  Basic metabolic panel     Status: Abnormal   Collection Time: 12/19/14  7:50 AM  Result Value Ref Range   Sodium 140 135 - 145 mmol/L    Comment: Please note change in  reference range.   Potassium 3.6 3.5 - 5.1 mmol/L    Comment: Please note change in reference range.   Chloride 101 96 - 112 mEq/L   CO2 24 19 - 32 mmol/L   Glucose, Bld 156 (H) 70 - 99 mg/dL   BUN 31 (H) 6 - 23 mg/dL   Creatinine, Ser 4.42 (H) 0.50 - 1.10 mg/dL   Calcium 8.4 8.4 - 10.5 mg/dL   GFR calc non Af Amer 9 (L) >90 mL/min   GFR calc Af Amer 10 (L) >90 mL/min    Comment: (NOTE) The eGFR has been calculated using the CKD EPI equation. This calculation has not been validated in all clinical situations. eGFR's persistently <90 mL/min signify possible Chronic Kidney Disease.    Anion gap 15 5 - 15  CBC     Status: Abnormal   Collection Time: 12/19/14  7:50 AM  Result Value Ref Range   WBC 10.4 4.0 - 10.5 K/uL   RBC 3.67 (L) 3.87 - 5.11 MIL/uL   Hemoglobin 10.6 (L) 12.0 - 15.0 g/dL   HCT 36.3 36.0 - 46.0 %   MCV 98.9 78.0 - 100.0 fL   MCH 28.9 26.0 - 34.0 pg   MCHC 29.2 (L) 30.0 - 36.0 g/dL   RDW 17.1 (H) 11.5 - 15.5 %   Platelets 211 150 - 400 K/uL    Comment: DELTA CHECK NOTED  CBG monitoring, ED     Status: Abnormal   Collection Time: 12/19/14  9:32 AM  Result Value Ref Range   Glucose-Capillary 147 (H) 70 - 99 mg/dL   Comment 1 Documented in Chart    Comment 2 Notify RN     ROS:  See h pi for Positives  Physical Exam: Filed Vitals:   12/19/14 0730  BP: 121/40  Pulse: 80  Temp:   Resp: 19     General: pale  alert obese, WF chronically ill, NAD  HEENT: [place], Nonicteric, eomi Neck: supple, no jvd Heart: RRR 2/6 sem / no rub, or gallop Lungs: Sl decreased through pout  , Abdomen: Obese, BS pos. Soft , nontender, ND Extremities: trace  bipedal  edema Skin: pale warm dry, no overt rash or foot ulcers Neuro: OX3, moves all extrem with some Left lower extrem weakness Dialysis Access: Pos BRUIT Lfa avf / L ij perm cath  Dialysis Orders: Center: [facility]   on MWF . EDW 100.5 kg HD Bath 2.0 k, 2.25ca  Time  4hr Heparin 3300. Access L IJ PC, L FA AVF inserted  11/30/14   Vit d 0 mcg IV/HD Aranesp 150 q weekly   Units IV/HD  Venofer  0   Other  Op labs hgb 10.8  No pth data available(new pt)  Assessment/Plan 1. Chest pain  With elavated  CE - Cardiology evaluating  2. Palpitations and Tachycaria on admit- Card wu 3. ESRD -  HD MWF   4. Hypertension/volume  - bp 121/40 on Midodrine 56m pre hd currently  hd days ONLY/  And mild pedal edema/ no pul edema on cxr/ Op  meds noted Lasix and Hydralazine can be DC since on HD now/ on Metoprolol 523mbid for ? SVT in past/ a. Fib Doesn't need midodrine, BP's are up , will dc for now.  Close to dry wt, no gross volume on exam or CXR.  5. Anemia  - hgb 10.4 continue ESA ,  no iv fe (ON OP PO fe CAN DC) 6. Metabolic bone disease -  No vit d, phoslo 1 ac as op continue fu labs 7. IDDM- per admit 8. Obesity / Deconditioning  With DJD - continue PT when cleared by [facility] 9. Depression - on Zoloft 100 mg hs and Valium 2.5 mg hs=  somewhat lethargic  Taper down meds ( may be sec also to BP meds  And OxyContin q 4 hrs 10. Nutrition -  Renal Clementeen Graham mod diet  Ernest Haber, PA-C [place] [phone] 12/19/2014, 10:34 AM   Pt seen, examined and agree w A/P as above. ESRD patient with afib/RVR at OP hemodialysis.  Recently started HD while at [facility], then was dc'd to SNF about 10 days ago.  Had rapid heart rate late into HD yesterday and was sent to ED.   Daughter says she's had this before.  Daughter worried about "tremors" that started recently while at [facility]. Initially they were felt to be related to  Neurontin which was stopped and it got better.   She says then that Zoloft was started and tremors returned, it's affecting her Rehab, etc., and she would like the Zoloft stopped.  Due for HD tomorrow. Will stop Zoloft.  Kelly Splinter MD pager [phone]    cell [phone] 12/19/2014, 4:29 PM

## 2014-12-19 NOTE — ED Notes (Signed)
Hospitalist at the bedside 

## 2014-12-19 NOTE — ED Notes (Signed)
Ordered regular tray for lunch

## 2014-12-19 NOTE — Progress Notes (Signed)
[facility] PROGRESS NOTE   [name] James [id number] DOB: [birth year] DOA: 12/18/2014 PCP: Shirline Frees, MD  HPI/Subjective: Seen earlier today by my colleague Dr. Blaine Hamper. Seen by cardiology, admitted for chest pain and tachycardia.  Assessment/Plan: Principal Problem:   Chest pain Active Problems:   HTN (hypertension)   DM type 2, uncontrolled, with renal complications   Dyslipidemia   Iron deficiency anemia   Hemodialysis-associated hypotension   ESRD (end stage renal disease) on dialysis   CHF (congestive heart failure)   Palpitation   ESRD (end stage renal disease)   Admitted for chest pain, tachycardia in patient with HTN, DM 2, ESRD and anemia. Currently does not have chest pain. Patient is on heparin drip, aspirin given, beta blockers and statins. Await cardiology recommendation.  Code Status: Full code Family Communication: Plan discussed with the patient. Disposition Plan: Remains inpatient   Consultants:  cardiology  Procedures:  *None  Antibiotics:  None   Objective: Filed Vitals:   12/19/14 1237  BP: 124/31  Pulse: 81  Temp: 98.5 F (36.9 C)  Resp: 19    Intake/Output Summary (Last 24 hours) at 12/19/14 1321 Last data filed at 12/19/14 1147  Gross per 24 hour  Intake      3 ml  Output      0 ml  Net      3 ml   Filed Weights   12/18/14 1634 12/19/14 1237  Weight: 102 kg (224 lb 13.9 oz) 101 kg (222 lb 10.6 oz)    Exam: General: Alert and awake, oriented x3, not in any acute distress. HEENT: anicteric sclera, pupils reactive to light and accommodation, EOMI CVS: S1-S2 clear, no murmur rubs or gallops Chest: clear to auscultation bilaterally, no wheezing, rales or rhonchi Abdomen: soft nontender, nondistended, normal bowel sounds, no organomegaly Extremities: no cyanosis, clubbing or edema noted bilaterally Neuro: Cranial nerves II-XII intact, no focal neurological deficits  Data Reviewed: Basic Metabolic  Panel:  Recent Labs Lab 12/18/14 1918 12/19/14 0501 12/19/14 0750  NA 141  --  140  K 3.0*  --  3.6  CL 102  --  101  CO2 27  --  24  GLUCOSE 166*  --  156*  BUN 24*  --  31*  CREATININE 3.54*  --  4.42*  CALCIUM 8.3*  --  8.4  MG  --  2.1  --    Liver Function Tests: No results for input(s): AST, ALT, ALKPHOS, BILITOT, PROT, ALBUMIN in the last 168 hours. No results for input(s): LIPASE, AMYLASE in the last 168 hours. No results for input(s): AMMONIA in the last 168 hours. CBC:  Recent Labs Lab 12/18/14 1918 12/19/14 0750  WBC 9.5 10.4  NEUTROABS 4.8  --   HGB 11.2* 10.6*  HCT 37.6 36.3  MCV 98.2 98.9  PLT 159 211   Cardiac Enzymes:  Recent Labs Lab 12/18/14 1918 12/19/14 0501 12/19/14 0950  TROPONINI 0.07* 0.09* 0.09*   BNP (last 3 results)  Recent Labs  11/03/14 0500 11/08/14 0714 11/13/14 0500  PROBNP 1032.0* 1198.0* 4493.0*   CBG:  Recent Labs Lab 12/19/14 0932  GLUCAP 147*    Micro No results found for this or any previous visit (from the past 240 hour(s)).   Studies: Dg Chest Port 1 View  12/18/2014   CLINICAL DATA:  Irregular heart rate at dialysis today, hand tremor. History CHF, renal insufficiency, breast cancer.  EXAM: PORTABLE CHEST - 1 VIEW  COMPARISON:  Chest radiograph November 30, 2014  FINDINGS: Cardiac silhouette is unremarkable and unchanged. Mildly calcified aortic knob. Hazy density in the LEFT lung base. Tunneled dialysis catheter via LEFT internal jugular venous approach with distal tips in the cavoatrial junction, unchanged. No pneumothorax. Soft tissue planes and included osseous structures are nonsuspicious. Interval removal of RIGHT internal jugular intravenous line.  IMPRESSION: Hazy density in LEFT lung base may reflect small layering pleural effusion, no frank pulmonary edema on today's examination.   Electronically Signed   By: Elon Alas   On: 12/18/2014 19:59    Scheduled Meds: . aspirin  324 mg Oral Daily   . atorvastatin  80 mg Oral q1800  . calcium acetate  667 mg Oral TID WC  . [START ON 12/20/2014] darbepoetin (ARANESP) injection - DIALYSIS  150 mcg Intravenous Q Wed-HD  . dorzolamide-timolol  1 drop Left Eye BID  . feeding supplement (NEPRO CARB STEADY)  237 mL Oral Q24H  . feeding supplement (PRO-STAT SUGAR FREE 64)  30 mL Oral TID WC  . folic acid  1 mg Oral Daily  . hydrALAZINE  50 mg Oral BID  . insulin glargine  15 Units Subcutaneous QHS  . loratadine  10 mg Oral Daily  . megestrol  40 mg Oral Daily  . Melatonin  6 mg Oral QHS  . metoprolol  50 mg Oral BID  . [START ON 12/20/2014] midodrine  10 mg Oral Q M,W,F  . multivitamin  1 tablet Oral QHS  . pantoprazole  40 mg Oral Daily  . senna-docusate  1 tablet Oral Daily  . sertraline  100 mg Oral Daily  . sodium chloride  3 mL Intravenous Q12H  . vitamin C  500 mg Oral BID   Continuous Infusions: . heparin 900 Units/hr (12/19/14 0435)       Time spent: 35 minutes    [facility]  [facility] Pager [phone] If 7PM-7AM, please contact night-coverage at [email or web address], password [facility] 12/19/2014, 1:21 PM  LOS: 1 day

## 2014-12-19 NOTE — ED Notes (Signed)
Requested a regular bed for the pt, pt is below 300 lbs & does not meet criteria for bariatric bed

## 2014-12-19 NOTE — ED Notes (Signed)
Called for inpatient bed at 8:59

## 2014-12-19 NOTE — Progress Notes (Signed)
ANTICOAGULATION CONSULT NOTE - Follow Up Consult  Pharmacy Consult:  Heparin Indication: chest pain/ACS  Allergies  Allergen Reactions  . Erythromycin Swelling  . Penicillins     She has taken keflex many times without porblems  . Codeine Rash    Patient Measurements: Weight: 224 lb 13.9 oz (102 kg) Heparin Dosing Weight: 76 kg  Vital Signs: BP: 149/83 mmHg (12/29 1200) Pulse Rate: 84 (12/29 1200)  Labs:  Recent Labs  12/18/14 1918 12/19/14 0501 12/19/14 0750 12/19/14 0950  HGB 11.2*  --  10.6*  --   HCT 37.6  --  36.3  --   PLT 159  --  211  --   LABPROT  --   --  14.8  --   INR  --   --  1.14  --   CREATININE 3.54*  --  4.42*  --   TROPONINI 0.07* 0.09*  --  0.09*    Estimated Creatinine Clearance: 12.3 mL/min (by C-G formula based on Cr of 4.42).      Assessment: 77 YOF presented with chest pain and palpitation to continue on IV heparin for ACS.  Heparin level sub-therapeutic; no bleeding reported.  No issue with infusion per RN.   Goal of Therapy:  Heparin level 0.3-0.7 units/ml Monitor platelets by anticoagulation protocol: Yes    Plan:  - Rebolus with heparin 2000 units IV, then - Increase heparin gtt to 1150 units/hr - Check 8 hr HL - Daily HL / CBC    Analys Ryden D. Mina Marble, PharmD, BCPS Pager:  [phone] 12/19/2014, 3:24 PM

## 2014-12-19 NOTE — ED Notes (Addendum)
Bariatric bed ordered for the pt

## 2014-12-20 ENCOUNTER — Inpatient Hospital Stay (HOSPITAL_COMMUNITY): Payer: Medicare Other

## 2014-12-20 DIAGNOSIS — R079 Chest pain, unspecified: Secondary | ICD-10-CM

## 2014-12-20 DIAGNOSIS — N189 Chronic kidney disease, unspecified: Secondary | ICD-10-CM

## 2014-12-20 DIAGNOSIS — I480 Paroxysmal atrial fibrillation: Secondary | ICD-10-CM

## 2014-12-20 DIAGNOSIS — R195 Other fecal abnormalities: Secondary | ICD-10-CM

## 2014-12-20 DIAGNOSIS — D631 Anemia in chronic kidney disease: Secondary | ICD-10-CM

## 2014-12-20 LAB — GLUCOSE, CAPILLARY
GLUCOSE-CAPILLARY: 175 mg/dL — AB (ref 70–99)
Glucose-Capillary: 165 mg/dL — ABNORMAL HIGH (ref 70–99)
Glucose-Capillary: 157 mg/dL — ABNORMAL HIGH (ref 70–99)
Glucose-Capillary: 229 mg/dL — ABNORMAL HIGH (ref 70–99)

## 2014-12-20 LAB — BASIC METABOLIC PANEL
Anion gap: 14 (ref 5–15)
BUN: 43 mg/dL — AB (ref 6–23)
CO2: 23 mmol/L (ref 19–32)
Calcium: 8.1 mg/dL — ABNORMAL LOW (ref 8.4–10.5)
Chloride: 101 mEq/L (ref 96–112)
Creatinine, Ser: 5.49 mg/dL — ABNORMAL HIGH (ref 0.50–1.10)
GFR, EST AFRICAN AMERICAN: 8 mL/min — AB (ref >90)
GFR, EST NON AFRICAN AMERICAN: 7 mL/min — AB (ref >90)
Glucose, Bld: 188 mg/dL — ABNORMAL HIGH (ref 70–99)
POTASSIUM: 4.5 mmol/L (ref 3.5–5.1)
Sodium: 138 mmol/L (ref 135–145)

## 2014-12-20 LAB — CBC
HEMATOCRIT: 35.1 % — AB (ref 36.0–46.0)
Hemoglobin: 10.3 g/dL — ABNORMAL LOW (ref 12.0–15.0)
MCH: 28.5 pg (ref 26.0–34.0)
MCHC: 29.3 g/dL — ABNORMAL LOW (ref 30.0–36.0)
MCV: 97.2 fL (ref 78.0–100.0)
Platelets: 182 10*3/uL (ref 150–400)
RBC: 3.61 MIL/uL — ABNORMAL LOW (ref 3.87–5.11)
RDW: 16.5 % — ABNORMAL HIGH (ref 11.5–15.5)
WBC: 8.8 10*3/uL (ref 4.0–10.5)

## 2014-12-20 LAB — HEPATITIS B SURFACE ANTIGEN: HEP B S AG: NEGATIVE

## 2014-12-20 MED ORDER — REGADENOSON 0.4 MG/5ML IV SOLN
0.4000 mg | Freq: Once | INTRAVENOUS | Status: AC
  Administered 2014-12-20: 0.4 mg via INTRAVENOUS

## 2014-12-20 MED ORDER — METOPROLOL TARTRATE 25 MG PO TABS
25.0000 mg | ORAL_TABLET | Freq: Once | ORAL | Status: AC
Start: 2014-12-20 — End: 2014-12-20
  Administered 2014-12-20: 25 mg via ORAL
  Filled 2014-12-20: qty 1

## 2014-12-20 MED ORDER — HYDRALAZINE HCL 25 MG PO TABS
25.0000 mg | ORAL_TABLET | Freq: Two times a day (BID) | ORAL | Status: DC
  Administered 2014-12-20 – 2014-12-22 (×4): 25 mg via ORAL
  Filled 2014-12-20 (×4): qty 1

## 2014-12-20 MED ORDER — DARBEPOETIN ALFA 150 MCG/0.3ML IJ SOSY
PREFILLED_SYRINGE | INTRAMUSCULAR | Status: AC
  Administered 2014-12-20: 150 ug via INTRAVENOUS
  Filled 2014-12-20: qty 0.3

## 2014-12-20 MED ORDER — TECHNETIUM TC 99M SESTAMIBI GENERIC - CARDIOLITE
30.0000 | Freq: Once | INTRAVENOUS | Status: AC | PRN
  Administered 2014-12-20: 30 via INTRAVENOUS

## 2014-12-20 MED ORDER — TECHNETIUM TC 99M SESTAMIBI GENERIC - CARDIOLITE
10.0000 | Freq: Once | INTRAVENOUS | Status: AC | PRN
  Administered 2014-12-20: 10 via INTRAVENOUS

## 2014-12-20 MED ORDER — MIDODRINE HCL 5 MG PO TABS
10.0000 mg | ORAL_TABLET | ORAL | Status: DC
  Administered 2014-12-20: 10 mg via ORAL
  Filled 2014-12-20: qty 2

## 2014-12-20 MED ORDER — AMIODARONE HCL 200 MG PO TABS
200.0000 mg | ORAL_TABLET | Freq: Two times a day (BID) | ORAL | Status: DC
  Administered 2014-12-20 – 2014-12-22 (×5): 200 mg via ORAL
  Filled 2014-12-20 (×5): qty 1

## 2014-12-20 MED ORDER — REGADENOSON 0.4 MG/5ML IV SOLN
INTRAVENOUS | Status: AC
  Administered 2014-12-20: 0.4 mg via INTRAVENOUS
  Filled 2014-12-20: qty 5

## 2014-12-20 NOTE — Clinical Social Work Psychosocial (Signed)
     Clinical Social Work Department BRIEF PSYCHOSOCIAL ASSESSMENT 12/20/2014  Patient:  [name] James, [name] James     Account Number:  [id number]     Admit date:  12/18/2014  Clinical Social Worker:  Adair Laundry  Date/Time:  12/20/2014 11:10 AM  Referred by:  Physician  Date Referred:  12/20/2014 Referred for  SNF Placement   Other Referral:   Interview type:  Family Other interview type:   Spoke with pt daughter [name] James    PSYCHOSOCIAL DATA Living Status:  FACILITY Admitted from facility:  [facility] Level of care:  [place] Primary support name:  [name] James Primary support relationship to patient:  CHILD, ADULT Degree of support available:   Pt has strong family support    CURRENT CONCERNS Current Concerns  Post-Acute Placement   Other Concerns:    SOCIAL WORK ASSESSMENT / PLAN CSW made aware that pt was admitted from SNF. CSW visited pt room and pt out of room for procedure. Pt daughter [name] James available and confirmed that pt was admitted from [facility]. Pt at facility for only about 10 days for ST rehab. Pt daughter informed CSW that long term plan will be for pt to come live with her in [place] after rehab. If needed, pt daughter stated that pt may stay with pt son inbetween rehab and move to [place]. Pt daughter expressed no questions or concerns at this time and is ready for pt to return to rehab and continue recovery.   Assessment/plan status:  Psychosocial Support/Ongoing Assessment of Needs Other assessment/ plan:   Information/referral to community resources:   None needed at this time    PATIENTS/FAMILYS RESPONSE TO PLAN OF CARE: Pt daughter pleasant and coopeartive. Pt family agreeable to plan to return to SNF.      [place], [place]

## 2014-12-20 NOTE — Progress Notes (Signed)
Patient Name: [name] James Date of Encounter: 12/20/2014  Principal Problem:   Chest pain Active Problems:   HTN (hypertension)   DM type 2, uncontrolled, with renal complications   Dyslipidemia   Iron deficiency anemia   Hemodialysis-associated hypotension   ESRD (end stage renal disease) on dialysis   CHF (congestive heart failure)   Palpitation   ESRD (end stage renal disease)   Length of Stay: 2  SUBJECTIVE  Mildly symptomatic with AF with RVR last night - this is not a new occurrence for her. Not on anticoagulation due to recent GI bleed of uncertain source (EGD Dr. Hilarie Fredrickson negative, colonoscopy not yet done). Now in NSR and getting Lexiscan Myoview. Troponin "plateau" pattern unchanged. Normal echo.  CURRENT MEDS . aspirin  324 mg Oral Daily  . atorvastatin  80 mg Oral q1800  . calcium acetate  667 mg Oral TID WC  . darbepoetin (ARANESP) injection - DIALYSIS  150 mcg Intravenous Q Wed-HD  . dorzolamide-timolol  1 drop Left Eye BID  . feeding supplement (NEPRO CARB STEADY)  237 mL Oral Q24H  . feeding supplement (PRO-STAT SUGAR FREE 64)  30 mL Oral TID WC  . folic acid  1 mg Oral Daily  . heparin subcutaneous  5,000 Units Subcutaneous 3 times per day  . hydrALAZINE  50 mg Oral BID  . insulin glargine  15 Units Subcutaneous QHS  . loratadine  10 mg Oral Daily  . megestrol  40 mg Oral Daily  . Melatonin  6 mg Oral QHS  . metoprolol  50 mg Oral BID  . multivitamin  1 tablet Oral QHS  . pantoprazole  40 mg Oral Daily  . senna-docusate  1 tablet Oral Daily  . sodium chloride  3 mL Intravenous Q12H  . vitamin C  500 mg Oral BID    OBJECTIVE   Intake/Output Summary (Last 24 hours) at 12/20/14 0939 Last data filed at 12/19/14 1147  Gross per 24 hour  Intake      3 ml  Output      0 ml  Net      3 ml   Filed Weights   12/18/14 1634 12/19/14 1237 12/20/14 0400  Weight: 224 lb 13.9 oz (102 kg) 222 lb 10.6 oz (101 kg) 225 lb 1.4 oz (102.1 kg)    PHYSICAL  EXAM Filed Vitals:   12/20/14 0156 12/20/14 0400 12/20/14 0742 12/20/14 0913  BP: 126/60 118/41 128/39 154/41  Pulse: 135 72 70   Temp: 98.3 F (36.8 C) 97.9 F (36.6 C) 98.3 F (36.8 C)   TempSrc: Oral Oral Oral   Resp: 20 20 17    Height:      Weight:  225 lb 1.4 oz (102.1 kg)    SpO2: 100% 99% 100%    General: Alert, oriented x3, no distress Head: no evidence of trauma, PERRL, EOMI, no exophtalmos or lid lag, no myxedema, no xanthelasma; normal ears, nose and oropharynx Neck: normal jugular venous pulsations and no hepatojugular reflux; brisk carotid pulses without delay and no carotid bruits Chest: clear to auscultation, no signs of consolidation by percussion or palpation, normal fremitus, symmetrical and full respiratory excursions, tunneled left SCl dialysis catheter Cardiovascular: normal position and quality of the apical impulse, regular rhythm, normal first and second heart sounds, no rubs or gallops, 2/6 holosystolic murmur Abdomen: no tenderness or distention, no masses by palpation, no abnormal pulsatility or arterial bruits, normal bowel sounds, no hepatosplenomegaly Extremities: fistula left wrist with excellent bruit  and thrill, no clubbing, cyanosis or edema; 2+ radial, ulnar and brachial pulses bilaterally; 2+ right femoral, posterior tibial and dorsalis pedis pulses; 2+ left femoral, posterior tibial and dorsalis pedis pulses; no subclavian or femoral bruits Neurological: grossly nonfocal  LABS  CBC  Recent Labs  12/18/14 1918 12/19/14 0750  WBC 9.5 10.4  NEUTROABS 4.8  --   HGB 11.2* 10.6*  HCT 37.6 36.3  MCV 98.2 98.9  PLT 159 [id number]   Basic Metabolic Panel  Recent Labs  12/19/14 0501 12/19/14 0750 12/20/14 0413  NA  --  140 138  K  --  3.6 4.5  CL  --  101 101  CO2  --  24 23  GLUCOSE  --  156* 188*  BUN  --  31* 43*  CREATININE  --  4.42* 5.49*  CALCIUM  --  8.4 8.1*  MG 2.1  --   --    Liver Function Tests No results for input(s): AST,  ALT, ALKPHOS, BILITOT, PROT, ALBUMIN in the last 72 hours. No results for input(s): LIPASE, AMYLASE in the last 72 hours. Cardiac Enzymes  Recent Labs  12/19/14 0501 12/19/14 0950 12/19/14 1605  TROPONINI 0.09* 0.09* 0.08*  Radiology Studies Dg Chest Port 1 View  12/18/2014   CLINICAL DATA:  Irregular heart rate at dialysis today, hand tremor. History CHF, renal insufficiency, breast cancer.  EXAM: PORTABLE CHEST - 1 VIEW  COMPARISON:  Chest radiograph November 30, 2014  FINDINGS: Cardiac silhouette is unremarkable and unchanged. Mildly calcified aortic knob. Hazy density in the LEFT lung base. Tunneled dialysis catheter via LEFT internal jugular venous approach with distal tips in the cavoatrial junction, unchanged. No pneumothorax. Soft tissue planes and included osseous structures are nonsuspicious. Interval removal of RIGHT internal jugular intravenous line.  IMPRESSION: Hazy density in LEFT lung base may reflect small layering pleural effusion, no frank pulmonary edema on today's examination.   Electronically Signed   By: Elon Alas   On: 12/18/2014 19:59    TELE AF for about 3-4 hours , RVR 120-130 last night  ECG 12/30 normal sinus rhythm, nonspecific T wave inversion I and aVL  ASSESSMENT AND PLAN  Difficult situation re: stroke prevention since she has both high embolic risk (CHADSVasc 5) and high bleeding risk. Continue ASA. Will probably wait on completion of GI workup before recommending full anticoagulation (warfarin only option there). Rate control needs to be improved, but her tendency to hypotension is another difficult issue. After nuclear scan she will have dialysis today. Probably DC tomorrow if nuclear scan is OK.  Sanda Klein, MD, [facility] [phone] office [phone] pager 12/20/2014 9:39 AM  prog

## 2014-12-20 NOTE — Progress Notes (Signed)
[facility] PROGRESS NOTE   [name] James [id number] DOB: [birth year] DOA: 12/18/2014 PCP: Shirline Frees, MD  HPI/Subjective: completed stress test For dialysis today and back to SNF in AM   Assessment/Plan: Principal Problem:   Chest pain Active Problems:   HTN (hypertension)   DM type 2, uncontrolled, with renal complications   Dyslipidemia   Iron deficiency anemia   Hemodialysis-associated hypotension   ESRD (end stage renal disease) on dialysis   CHF (congestive heart failure)   Palpitation   ESRD (end stage renal disease)   Atrial fib -rate controlled ASA -will need completed GI eval (colonscopy) before warfarin started as high bleeding risk  Chest pain -stress test  ESRD -HD today  Deconditioning -back to SNF in AM    Code Status: Full code Family Communication: daughter at bedside Disposition Plan: Remains inpatient   Consultants:  Cardiology  renal  Procedures:  Stress test  Antibiotics:  None   Objective: Filed Vitals:   12/20/14 1133  BP: 152/57  Pulse: 88  Temp: 97.8 F (36.6 C)  Resp: 19   No intake or output data in the 24 hours ending 12/20/14 1200 Filed Weights   12/18/14 1634 12/19/14 1237 12/20/14 0400  Weight: 102 kg (224 lb 13.9 oz) 101 kg (222 lb 10.6 oz) 102.1 kg (225 lb 1.4 oz)    Exam: General: Alert and awake, oriented x3, not in any acute distress. Chest: clear to auscultation bilaterally, no wheezing, rales or rhonchi Abdomen: soft nontender, obese Extremities: no cyanosis, clubbing or edema noted bilaterally   Data Reviewed: Basic Metabolic Panel:  Recent Labs Lab 12/18/14 1918 12/19/14 0501 12/19/14 0750 12/20/14 0413  NA 141  --  140 138  K 3.0*  --  3.6 4.5  CL 102  --  101 101  CO2 27  --  24 23  GLUCOSE 166*  --  156* 188*  BUN 24*  --  31* 43*  CREATININE 3.54*  --  4.42* 5.49*  CALCIUM 8.3*  --  8.4 8.1*  MG  --  2.1  --   --    Liver Function Tests: No results for  input(s): AST, ALT, ALKPHOS, BILITOT, PROT, ALBUMIN in the last 168 hours. No results for input(s): LIPASE, AMYLASE in the last 168 hours. No results for input(s): AMMONIA in the last 168 hours. CBC:  Recent Labs Lab 12/18/14 1918 12/19/14 0750  WBC 9.5 10.4  NEUTROABS 4.8  --   HGB 11.2* 10.6*  HCT 37.6 36.3  MCV 98.2 98.9  PLT 159 211   Cardiac Enzymes:  Recent Labs Lab 12/18/14 1918 12/19/14 0501 12/19/14 0950 12/19/14 1605  TROPONINI 0.07* 0.09* 0.09* 0.08*   BNP (last 3 results)  Recent Labs  11/03/14 0500 11/08/14 0714 11/13/14 0500  PROBNP 1032.0* 1198.0* 4493.0*   CBG:  Recent Labs Lab 12/19/14 0932 12/19/14 1609 12/19/14 2113 12/20/14 0741 12/20/14 1135  GLUCAP 147* 168* 183* 165* 175*    Micro Recent Results (from the past 240 hour(s))  MRSA PCR Screening     Status: None   Collection Time: 12/19/14  1:32 PM  Result Value Ref Range Status   MRSA by PCR NEGATIVE NEGATIVE Final    Comment:        The GeneXpert MRSA Assay (FDA approved for NASAL specimens only), is one component of a comprehensive MRSA colonization surveillance program. It is not intended to diagnose MRSA infection nor to guide or monitor treatment for MRSA infections.  Studies: Dg Chest Port 1 View  12/18/2014   CLINICAL DATA:  Irregular heart rate at dialysis today, hand tremor. History CHF, renal insufficiency, breast cancer.  EXAM: PORTABLE CHEST - 1 VIEW  COMPARISON:  Chest radiograph November 30, 2014  FINDINGS: Cardiac silhouette is unremarkable and unchanged. Mildly calcified aortic knob. Hazy density in the LEFT lung base. Tunneled dialysis catheter via LEFT internal jugular venous approach with distal tips in the cavoatrial junction, unchanged. No pneumothorax. Soft tissue planes and included osseous structures are nonsuspicious. Interval removal of RIGHT internal jugular intravenous line.  IMPRESSION: Hazy density in LEFT lung base may reflect small layering  pleural effusion, no frank pulmonary edema on today's examination.   Electronically Signed   By: Elon Alas   On: 12/18/2014 19:59    Scheduled Meds: . aspirin  324 mg Oral Daily  . atorvastatin  80 mg Oral q1800  . calcium acetate  667 mg Oral TID WC  . darbepoetin (ARANESP) injection - DIALYSIS  150 mcg Intravenous Q Wed-HD  . dorzolamide-timolol  1 drop Left Eye BID  . feeding supplement (NEPRO CARB STEADY)  237 mL Oral Q24H  . feeding supplement (PRO-STAT SUGAR FREE 64)  30 mL Oral TID WC  . folic acid  1 mg Oral Daily  . heparin subcutaneous  5,000 Units Subcutaneous 3 times per day  . hydrALAZINE  50 mg Oral BID  . insulin glargine  15 Units Subcutaneous QHS  . loratadine  10 mg Oral Daily  . megestrol  40 mg Oral Daily  . Melatonin  6 mg Oral QHS  . metoprolol  50 mg Oral BID  . midodrine  10 mg Oral Q M,W,F-HD  . multivitamin  1 tablet Oral QHS  . pantoprazole  40 mg Oral Daily  . senna-docusate  1 tablet Oral Daily  . sodium chloride  3 mL Intravenous Q12H  . vitamin C  500 mg Oral BID   Continuous Infusions:       Time spent: 25 minutes    VANN, JESSICA  [facility] Pager [phone] If 7PM-7AM, please contact night-coverage at [email or web address], password [facility] 12/20/2014, 12:00 PM  LOS: 2 days

## 2014-12-20 NOTE — Progress Notes (Signed)
Converted to NSR with HR 60s-70s.  Will continue to monitor.  Jodell Cipro

## 2014-12-20 NOTE — Procedures (Signed)
I was present at this dialysis session, have reviewed the session itself and made  appropriate changes  Kelly Splinter MD (pgr) [phone]    ([phone] 12/20/2014, 2:11 PM

## 2014-12-20 NOTE — Progress Notes (Signed)
PT Cancellation Note  Patient Details Name: [name] James MRN: [id number] DOB: [birth year]   Cancelled Treatment:    Reason Eval/Treat Not Completed: Patient not medically ready (Patient orders for strict bedrest at this time.)   Duncan Dull 12/20/2014, 3:04 PM Alben Deeds, [place] DPT  [phone]

## 2014-12-20 NOTE — Consult Note (Signed)
Referring Provider: [facility] Primary Care Physician:  Shirline Frees, MD Primary Gastroenterologist:  Dr Hilarie Fredrickson  Reason for Consultation:  Chronic anemia  HPI: [name] James is a 78 y.o. female with a hx of IDDM, morbid obesity, stage 3 CKD, anemia.She has been unable to tolerate po iron in the past. She was admitted in late October 2015 with DOE, resting dyspnea, LE edema. CXR at the time had vascular congestion without pulmonary edema. 2D echoshows 60 to 65% LVEF. Grade 1 diastolic dysfunction, trivial aortic regurge.. On that admission, Hgb was 9.8, 8.5 post hydration. Hgb drifted to 6.8 on hosp day 5 and she was transfused 2 units prbcs to 8.4. She had had melena and stools were heme positive, Had EGD by Dr Hilarie Fredrickson 11/3 that showed esophagitis, gastritis and some duodenal inflammation , bx neg for H pylori.  She was re admitted 12/29. She was in dialysis on the 28 and started to have palpitations, irreg heartbeat and chest pain.Work up in the ED demonstrates elevated troponin 0.07. EKG showed nonspecific T-wave change in lead II and aVF. Stress nuclear study was low risk. No plan for  Cardiac cath. Normal LVEF. Pt is to be started on anticoagulation, and in light of recent GI bleed, we were asked to evaluate the need for colonoscopy before initiation of warfarin therapy.Pt says she has had a colonoscopy years ago by Dr Olevia Perches and pt says it was nl, though records not available. Pt does not feel she can go through with colonoscopy at this time---feels "tired".    Past Medical History  Diagnosis Date  . CHF (congestive heart failure) 05/13/2011    echo - [id number]; stage 1 diastolic dysfunction; elevated LV filling pressure, MAC  . Chest pain, atypical 12/05/2008    R/Lmv- normal perfusion all regions, noe ECG changes   . Renal insufficiency 10/30/2005    doppler - abn resistance consistent w/ parenchymal disease  . Claudication 10/30/2005    doppler - normal evaluation, no evidence  of aneurysm, diameter reduction, dissection, compression or vascular abnormality)  . DM (diabetes mellitus)   . Peripheral neuropathy   . Hypertension   . Edema   . Gout   . Anxiety   . Arthritis   . Cataract   . GERD (gastroesophageal reflux disease)   . Breast cancer 1980s    bil mastectomies, no radiation or chemo  . Complication of anesthesia   . PONV (postoperative nausea and vomiting)   . Family history of adverse reaction to anesthesia     son also has nausea     Past Surgical History  Procedure Laterality Date  . Mastectomy Bilateral ~1982    bil breast reconstruction with implants  . Back surgery  ~1980  . Hemorrhoid surgery    . Cholecystectomy    . Eye surgery    . Orif tibia & fibula fractures Left 2007    also had left non displaced malleolar fracture.   . Esophagogastroduodenoscopy N/A 10/24/2014    Procedure: ESOPHAGOGASTRODUODENOSCOPY (EGD);  Surgeon: Jerene Bears, MD;  Location: [facility] ENDOSCOPY;  Service: Endoscopy;  Laterality: N/A;  . Av fistula placement Left 11/30/2014    Procedure: ARTERIOVENOUS (AV) FISTULA CREATION LEFT ARM;  Surgeon: Angelia Mould, MD;  Location: [place];  Service: Vascular;  Laterality: Left;  . Exchange of a dialysis catheter Left 11/30/2014    Procedure: EXCHANGE OF A DIALYSIS CATHETER, LEFT INTERNAL JUGULAR;  Surgeon: Angelia Mould, MD;  Location: [place];  Service: Vascular;  Laterality: Left;    Prior to Admission medications   Medication Sig Start Date End Date Taking? Authorizing Provider  Amino Acids-Protein Hydrolys (FEEDING SUPPLEMENT, PRO-STAT SUGAR FREE 64,) LIQD Take 30 mLs by mouth 3 (three) times daily with meals.   Yes Historical Provider, MD  b complex-vitamin c-folic acid (NEPHRO-VITE) 0.8 MG TABS tablet Take 1 tablet by mouth at bedtime.   Yes Historical Provider, MD  calcium acetate (PHOSLO) 667 MG capsule Take 1 capsule (667 mg total) by mouth 3 (three) times daily with meals. 11/02/14  Yes Geradine Girt,  DO  Darbepoetin Alfa (ARANESP) 100 MCG/0.5ML SOSY injection Inject 0.5 mLs (100 mcg total) into the skin every Friday at 6 PM. Patient taking differently: Inject 100 mcg into the skin every Wednesday.  11/02/14  Yes Geradine Girt, DO  diazepam (VALIUM) 5 MG tablet Take 1/2 tablet by mouth once at bedtime for rest 12/07/14  Yes Lauree Chandler, NP  dorzolamide-timolol (COSOPT) 22.3-6.8 MG/ML ophthalmic solution Place 1 drop into the left eye 2 (two) times daily.    Yes Historical Provider, MD  ferrous sulfate 325 (65 FE) MG tablet Take 325 mg by mouth daily with breakfast.   Yes Historical Provider, MD  folic acid (FOLVITE) 1 MG tablet Take 1 mg by mouth daily.   Yes Historical Provider, MD  heparin 5000 UNIT/ML injection Inject 5,000 Units into the skin every 12 (twelve) hours.   Yes Historical Provider, MD  insulin aspart (NOVOLOG) 100 UNIT/ML injection Inject 5 Units into the skin 3 (three) times daily before meals.   Yes Historical Provider, MD  insulin aspart (NOVOLOG) 100 UNIT/ML injection Inject 0-9 Units into the skin 3 (three) times daily before meals. Sliding scale   Yes Historical Provider, MD  insulin detemir (LEVEMIR) 100 UNIT/ML injection Inject 20 Units into the skin at bedtime.   Yes Historical Provider, MD  loratadine (CLARITIN) 10 MG tablet Take 10 mg by mouth daily.   Yes Historical Provider, MD  megestrol (MEGACE) 40 MG/ML suspension Take 40 mg by mouth daily.   Yes Historical Provider, MD  Melatonin 3 MG TABS Take 6 mg by mouth at bedtime.   Yes Historical Provider, MD  metoprolol (LOPRESSOR) 50 MG tablet Take 50 mg by mouth 2 (two) times daily.   Yes Historical Provider, MD  midodrine (PROAMATINE) 10 MG tablet Take 10 mg by mouth every Monday, Wednesday, and Friday.   Yes Historical Provider, MD  pantoprazole (PROTONIX) 40 MG tablet Take 1 tablet (40 mg total) by mouth daily. 11/02/14  Yes Geradine Girt, DO  promethazine (PHENERGAN) 25 MG tablet Take 25 mg by mouth 2 (two)  times daily as needed for nausea or vomiting.   Yes Historical Provider, MD  sennosides-docusate sodium (SENOKOT-S) 8.6-50 MG tablet Take 1 tablet by mouth daily.   Yes Historical Provider, MD  sertraline (ZOLOFT) 100 MG tablet Take 100 mg by mouth daily.   Yes Historical Provider, MD  vitamin C (ASCORBIC ACID) 500 MG tablet Take 500 mg by mouth 2 (two) times daily.   Yes Historical Provider, MD  enoxaparin (LOVENOX) 30 MG/0.3ML injection Inject 0.3 mLs (30 mg total) into the skin daily. 11/02/14   Geradine Girt, DO  furosemide (LASIX) 80 MG tablet Take 2 tablets (160 mg total) by mouth 3 (three) times daily. 11/02/14   Geradine Girt, DO  hydrALAZINE (APRESOLINE) 20 MG/ML injection Inject 0.5 mLs (10 mg total) into the vein every 6 (six) hours as needed (  IF sbp >160 mmHg). 11/02/14   Geradine Girt, DO  hydrALAZINE (APRESOLINE) 25 MG tablet Take 3 tablets (75 mg total) by mouth every 8 (eight) hours. 11/02/14   Geradine Girt, DO  insulin aspart (NOVOLOG) 100 UNIT/ML injection Inject 0-9 Units into the skin 3 (three) times daily with meals. 11/02/14   Geradine Girt, DO  insulin aspart (NOVOLOG) 100 UNIT/ML injection Inject 5 Units into the skin 3 (three) times daily with meals. 11/02/14   Geradine Girt, DO  insulin glargine (LANTUS) 100 UNIT/ML injection Inject 0.24 mLs (24 Units total) into the skin at bedtime. 11/02/14   Geradine Girt, DO  Nutritional Supplements (FEEDING SUPPLEMENT, NEPRO CARB STEADY,) LIQD Take 237 mLs by mouth daily. 11/02/14   Geradine Girt, DO  ondansetron (ZOFRAN) 4 MG/2ML SOLN injection Inject 2 mLs (4 mg total) into the vein every 6 (six) hours as needed for nausea. 11/02/14   Geradine Girt, DO  oxyCODONE (OXY IR/ROXICODONE) 5 MG immediate release tablet Take 0.5 tablets (2.5 mg total) by mouth every 4 (four) hours as needed for severe pain. 11/02/14   Geradine Girt, DO  polyethylene glycol (MIRALAX / GLYCOLAX) packet Take 17 g by mouth daily as needed for mild  constipation. 11/02/14   Geradine Girt, DO  potassium chloride SA (K-DUR,KLOR-CON) 20 MEQ tablet Take 1 tablet (20 mEq total) by mouth daily. 11/02/14   Geradine Girt, DO  simvastatin (ZOCOR) 20 MG tablet Take 20 mg by mouth every evening.    Historical Provider, MD    Current Facility-Administered Medications  Medication Dose Route Frequency Provider Last Rate Last Dose  . aspirin chewable tablet 324 mg  324 mg Oral Daily Ivor Costa, MD   324 mg at 12/19/14 1141  . atorvastatin (LIPITOR) tablet 80 mg  80 mg Oral q1800 Ivor Costa, MD   80 mg at 12/19/14 1735  . calcium acetate (PHOSLO) capsule 667 mg  667 mg Oral TID WC Ivor Costa, MD   667 mg at 12/19/14 1542  . Darbepoetin Alfa (ARANESP) injection 150 mcg  150 mcg Intravenous Q Wed-HD Foye Clock, PA-C      . diazepam (VALIUM) tablet 2.5 mg  2.5 mg Oral QHS PRN Ivor Costa, MD   2.5 mg at 12/19/14 0414  . dorzolamide-timolol (COSOPT) 22.3-6.8 MG/ML ophthalmic solution 1 drop  1 drop Left Eye BID Ivor Costa, MD   1 drop at 12/19/14 2238  . feeding supplement (NEPRO CARB STEADY) liquid 237 mL  237 mL Oral Q24H Ivor Costa, MD   237 mL at 12/19/14 0856  . feeding supplement (PRO-STAT SUGAR FREE 64) liquid 30 mL  30 mL Oral TID WC Ivor Costa, MD   30 mL at 12/19/14 1542  . folic acid (FOLVITE) tablet 1 mg  1 mg Oral Daily Ivor Costa, MD   1 mg at 12/19/14 1140  . heparin injection 5,000 Units  5,000 Units Subcutaneous 3 times per day Sanda Klein, MD   5,000 Units at 12/20/14 [id number]  . hydrALAZINE (APRESOLINE) tablet 25 mg  25 mg Oral BID Leonie Green Zeyfang, PA-C      . insulin glargine (LANTUS) injection 15 Units  15 Units Subcutaneous QHS Ivor Costa, MD   15 Units at 12/19/14 2200  . loratadine (CLARITIN) tablet 10 mg  10 mg Oral Daily Ivor Costa, MD   10 mg at 12/19/14 1125  . megestrol (MEGACE) tablet 40 mg  40 mg Oral Daily Mutaz  Elmahi, MD   40 mg at 12/19/14 1124  . Melatonin TABS 6 mg  6 mg Oral QHS Ivor Costa, MD   6 mg at 12/19/14 2238  .  metoprolol tartrate (LOPRESSOR) tablet 50 mg  50 mg Oral BID Ivor Costa, MD   50 mg at 12/20/14 1426  . morphine 2 MG/ML injection 2 mg  2 mg Intravenous Q4H PRN Ivor Costa, MD      . multivitamin (RENA-VIT) tablet 1 tablet  1 tablet Oral QHS Ivor Costa, MD   1 tablet at 12/19/14 2237  . nitroGLYCERIN (NITROSTAT) SL tablet 0.4 mg  0.4 mg Sublingual Q5 min PRN Ivor Costa, MD      . ondansetron [facility]) injection 4 mg  4 mg Intravenous Q8H PRN Ivor Costa, MD      . oxyCODONE (Oxy IR/ROXICODONE) immediate release tablet 2.5 mg  2.5 mg Oral Q4H PRN Ivor Costa, MD   2.5 mg at 12/19/14 0414  . pantoprazole (PROTONIX) EC tablet 40 mg  40 mg Oral Daily Ivor Costa, MD   40 mg at 12/19/14 1139  . polyethylene glycol (MIRALAX / GLYCOLAX) packet 17 g  17 g Oral Daily PRN Ivor Costa, MD      . promethazine (PHENERGAN) tablet 25 mg  25 mg Oral BID PRN Ivor Costa, MD      . senna-docusate (Senokot-S) tablet 1 tablet  1 tablet Oral Daily Ivor Costa, MD   1 tablet at 12/19/14 1129  . sodium chloride 0.9 % injection 3 mL  3 mL Intravenous Q12H Ivor Costa, MD   3 mL at 12/19/14 1147  . vitamin C (ASCORBIC ACID) tablet 500 mg  500 mg Oral BID Ivor Costa, MD   500 mg at 12/19/14 2237    Allergies as of 12/18/2014 - Review Complete 12/18/2014  Allergen Reaction Noted  . Erythromycin Swelling 02/01/2013  . Penicillins  02/01/2013  . Codeine Rash 02/01/2013    Family History  Problem Relation Age of Onset  . Cancer Mother   . Diabetes Mother   . Heart disease Mother   . Heart attack Father   . Stroke Father   . Parkinson's disease Brother   . Cancer Sister     History   Social History  . Marital Status: Divorced    Spouse Name: N/A    Number of Children: N/A  . Years of Education: N/A   Occupational History  . Not on file.   Social History Main Topics  . Smoking status: Never Smoker   . Smokeless tobacco: Never Used  . Alcohol Use: No  . Drug Use: No  . Sexual Activity: Not on file   Other Topics  Concern  . Not on file   Social History Narrative    Review of Systems: Gen: Denies any fever, chills, sweats, anorexia, fatigue, weakness, malaise, weight loss, and sleep disorder CV: Has had chest pain "on and off" Resp:  dyspnea with exercise, cough, sputum, wheezing, coughing up blood, and pleurisy. GI: Denies vomiting blood, jaundice, and fecal incontinence.   Denies dysphagia or odynophagia. GU : Denies urinary burning, blood in urine, urinary frequency, urinary hesitancy, nocturnal urination, and urinary incontinence. MS: Denies joint pain, limitation of movement, and swelling, stiffness, low back pain, extremity pain. Denies muscle weakness, cramps, atrophy.  Derm: Denies rash, itching, dry skin, hives, moles, warts, or unhealing ulcers.  Psych: Denies depression, anxiety, memory loss, suicidal ideation, hallucinations, paranoia, and confusion. Heme: Denies bruising, bleeding, and enlarged lymph nodes.  Neuro:  Denies any headaches, dizziness, paresthesias. Endo:  Denies any problems with DM, thyroid, adrenal function.  Physical Exam: Vital signs in last 24 hours: Temp:  [97.8 F (36.6 C)-98.9 F (37.2 C)] 98.2 F (36.8 C) (12/30 1230) Pulse Rate:  [70-135] 78 (12/30 1415) Resp:  [17-24] 24 (12/30 1230) BP: (118-206)/(39-70) 163/70 mmHg (12/30 1415) SpO2:  [94 %-100 %] 94 % (12/30 1230) Weight:  [220 lb 7.4 oz (100 kg)-225 lb 1.4 oz (102.1 kg)] 220 lb 7.4 oz (100 kg) (12/30 1230) Last BM Date: 12/20/14 General:   Alert,  Well-developed, well-nourished, pleasant and cooperative in NAD Head:  Normocephalic and atraumatic. Eyes:  Sclera clear, no icterus.   Conjunctiva pink. Ears:  Normal auditory acuity. Nose:  No deformity, discharge,  or lesions. Mouth:  No deformity or lesions.   Neck:  Supple; no masses or thyromegaly. Lungs:  Clear throughout to auscultation.   No wheezes, crackles, or rhonchi.  Heart:  Regular rate and rhythm; 2/6 murmur Abdomen:  Soft,nontender, BS  active,nonpalp mass or hsm.   Rectal:  Deferred  Msk:  Symmetrical without gross deformities. . Pulses:  Normal pulses noted. Extremities:  2-3+ edema Neurologic:  Alert and  oriented x4;  grossly normal neurologically. Skin:  Intact without significant lesions or rashes.. Psych:  Alert and cooperative. Normal mood and affect.  Lab Results:  Recent Labs  12/18/14 1918 12/19/14 0750 12/20/14 1311  WBC 9.5 10.4 8.8  HGB 11.2* 10.6* 10.3*  HCT 37.6 36.3 35.1*  PLT 159 211 182   MCV on 12/20/14 97.2 BMET  Recent Labs  12/18/14 1918 12/19/14 0750 12/20/14 0413  NA 141 140 138  K 3.0* 3.6 4.5  CL 102 101 101  CO2 27 24 23   GLUCOSE 166* 156* 188*  BUN 24* 31* 43*  CREATININE 3.54* 4.42* 5.49*  CALCIUM 8.3* 8.4 8.1*     Recent Labs  12/19/14 0750  LABPROT 14.8  INR 1.14   Studies/Results: Nm Myocar Multi W/spect W/wall Motion / Ef  12/20/2014   CLINICAL DATA:  Chest pain. CHF. Renal insufficiency. Myocardial ischemia. Abnormal cardiac enzymes.  EXAM: MYOCARDIAL IMAGING WITH SPECT (REST AND PHARMACOLOGIC-STRESS)  GATED LEFT VENTRICULAR WALL MOTION STUDY  LEFT VENTRICULAR EJECTION FRACTION  TECHNIQUE: Standard myocardial SPECT imaging was performed after resting intravenous injection of 10 mCi Tc-79m sestamibi. Subsequently, intravenous infusion of Lexiscan was performed under the supervision of the Cardiology staff. At peak effect of the drug, 30 mCi Tc-25m sestamibi was injected intravenously and standard myocardial SPECT imaging was performed. Quantitative gated imaging was also performed to evaluate left ventricular wall motion, and estimate left ventricular ejection fraction.  COMPARISON:  12/18/2014 chest radiograph.  FINDINGS: Perfusion: Small area of fixed decreased perfusion is present in the mid inferior lateral wall. No reversible defects. No peri-infarct ischemia.  Wall Motion: Normal.  Left Ventricular Ejection Fraction: 68 %  End diastolic volume 59 ml  End  systolic volume 19 ml  IMPRESSION: 1. No reversible ischemia.  Small mid inferolateral wall infarct.  2. Normal left ventricular wall motion.  3. Left ventricular ejection fraction 68%  4. Low-risk stress test findings*.  *2012 Appropriate Use Criteria for Coronary Revascularization Focused Update: J Am Coll Cardiol. N6492421. http://content.airportbarriers.com.aspx?articleid=1201161   Electronically Signed   By: Dereck Ligas M.D.   On: 12/20/2014 12:57   Dg Chest Port 1 View  12/18/2014   CLINICAL DATA:  Irregular heart rate at dialysis today, hand tremor. History CHF, renal insufficiency, breast cancer.  EXAM:  PORTABLE CHEST - 1 VIEW  COMPARISON:  Chest radiograph November 30, 2014  FINDINGS: Cardiac silhouette is unremarkable and unchanged. Mildly calcified aortic knob. Hazy density in the LEFT lung base. Tunneled dialysis catheter via LEFT internal jugular venous approach with distal tips in the cavoatrial junction, unchanged. No pneumothorax. Soft tissue planes and included osseous structures are nonsuspicious. Interval removal of RIGHT internal jugular intravenous line.  IMPRESSION: Hazy density in LEFT lung base may reflect small layering pleural effusion, no frank pulmonary edema on today's examination.   Electronically Signed   By: Elon Alas   On: 12/18/2014 19:59    IMPRESSION/PLAN: 78 yo female with normocytic anemia, s/p recent egd with esophagitis, gastritis, mild duodenitis, h pylori neg. Anemia appears chronic. Pt not enthusiastic over prospect of colonoscopy, as she says she has been through a lot and doesn't know how much more she can take. Would monitor h/h. Will review with Dr Carlean Purl as to pursuing colonoscopy at this time, but pt does not seem interested.      Hvozdovic, Deloris Ping 12/20/2014,  Pager [phone]  [place] [facility] Attending  I have also seen and assessed the patient and agree with the above note. Spoke to patient and daughter. Not inclined to  pursue a colonoscopy now. Sig anxiety from daughter over ID of melena she had on prior admit - could have been from inflammatory changes seen at EGD but we are not sure. Melena without clear source and the hem+ anemia are indications for a colonoscopy but she is frail and unwilling to take the stress of a colonoscopy and this is understandable.  I explained that odds are she would not have a major bleed on warfarin but cannot be sure - and that it would be reasonable to try that to reduce stroke risk. Will defer to cardiology and patient + daughter as to whether or not warfarin is used.  If has another hemorrhage or improved to pint she thinks she can tolerate a colon prep we can reconsider endoscopic evaluation. I do not think a capsule endoscopy is likely to change anything we do.  Continue iron and EPO support.  Gatha Mayer, MD, [facility] [phone] (pager) 12/20/2014 4:07 PM

## 2014-12-20 NOTE — Progress Notes (Signed)
HR sustaining in 130s.  EKG shows A-fib with RVR.  Pt. Asymptomatic.  VSS.  Dr. Claiborne Billings notified.  Order received for metoprolol 25 mg po x 1.  Will continue to monitor.  [name] James

## 2014-12-20 NOTE — Progress Notes (Signed)
Subjective:  Back from Cardiac stress test no cos , for hd today  Objective Vital signs in last 24 hours: Filed Vitals:   12/20/14 1001 12/20/14 1003 12/20/14 1005 12/20/14 1133  BP: 197/48 206/49 206/49 152/57  Pulse:    88  Temp:    97.8 F (36.6 C)  TempSrc:    Oral  Resp:    19  Height:      Weight:      SpO2:    99%   Weight change: -1 kg (-2 lb 3.3 oz)  Physical Exam: General: alert, NAD , appropriate Heart: RRR 2/6 sem / no rub, or gallop Lungs: Sl decreased through out , Abdomen: Obese, BS pos. Soft , nontender, ND Extremities: trace bipedal edema Dialysis Access: Pos. BRUIT L FA  avf / L ij perm cath  Dialysis: [facility] on MWF . EDW 100.5 kg HD Bath 2.0 k, 2.25ca Time 4hr Heparin 3300. Access L IJ PC, L FA AVF inserted 11/30/14  Vit d 0 mcg IV/HD Aranesp 150 q weekly Units IV/HD Venofer 0  Other Op labs hgb 10.8 No pth data available(new pt)  Problem/Plan: 1. Chest pain With elavated CE - Cardiology evaluating / stress test just done 2. Palpitations and Tachycaria on admit- resolved now but noted episode last pm= Card wu 3. ESRD - HD MWF Recent start HD and tolerating /symptomatically better 4. Hypertension/volume - bp 152/57 , Midodrine DC'd  sec to stable BP and up since admit / no pul edema on cxr/  Close to edw BUT establishing EDW as she is new to hd/ PRIOR Op meds noted Lasix  can be DC since on HD now/ on Metoprolol 50mg  bid with ho  SVT t/ a. Fib/ Taper down Hydralazine 50 mg to 25mg  anticipating BP drops on hd 5. Anemia - hgb 10.6continue ESA , no iv fe (ON OP PO fe CAN DC) 6. Metabolic bone disease - No vit d, phoslo 1 ac as op continue fu labs 7. IDDM- per admit 8. Obesity / Deconditioning With DJD - continue PT when cleared by [facility] 9. Depression - Zoloft dc'd (family request), still somewhat lethargic. Not getting any other scheduled narcotics or sedating meds, is getting prn oxy IR and prn qhs Valium 10. Nutrition - Renal Clementeen Graham  mod diet  Ernest Haber, PA-C [place] [phone] 12/20/2014,11:57 AM  LOS: 2 days    Pt seen, examined, agree w assess/plan as above with additions as indicated. Consider getting rid of the prn qhs Valium.  Will follow, HD today.  Kelly Splinter MD pager [phone]    cell [phone] 12/20/2014, 1:44 PM      Labs: Basic Metabolic Panel:  Recent Labs Lab 12/18/14 1918 12/19/14 0750 12/20/14 0413  NA 141 140 138  K 3.0* 3.6 4.5  CL 102 101 101  CO2 27 24 23   GLUCOSE 166* 156* 188*  BUN 24* 31* 43*  CREATININE 3.54* 4.42* 5.49*  CALCIUM 8.3* 8.4 8.1*    CBC:  Recent Labs Lab 12/18/14 1918 12/19/14 0750  WBC 9.5 10.4  NEUTROABS 4.8  --   HGB 11.2* 10.6*  HCT 37.6 36.3  MCV 98.2 98.9  PLT 159 211   Cardiac Enzymes:  Recent Labs Lab 12/18/14 1918 12/19/14 0501 12/19/14 0950 12/19/14 1605  TROPONINI 0.07* 0.09* 0.09* 0.08*   CBG:  Recent Labs Lab 12/19/14 0932 12/19/14 1609 12/19/14 2113 12/20/14 0741 12/20/14 1135  GLUCAP 147* 168* 183* 165* 175*    Studies/Results: Dg  Chest Port 1 View  12/18/2014   CLINICAL DATA:  Irregular heart rate at dialysis today, hand tremor. History CHF, renal insufficiency, breast cancer.  EXAM: PORTABLE CHEST - 1 VIEW  COMPARISON:  Chest radiograph November 30, 2014  FINDINGS: Cardiac silhouette is unremarkable and unchanged. Mildly calcified aortic knob. Hazy density in the LEFT lung base. Tunneled dialysis catheter via LEFT internal jugular venous approach with distal tips in the cavoatrial junction, unchanged. No pneumothorax. Soft tissue planes and included osseous structures are nonsuspicious. Interval removal of RIGHT internal jugular intravenous line.  IMPRESSION: Hazy density in LEFT lung base may reflect small layering pleural effusion, no frank pulmonary edema on today's examination.   Electronically Signed   By: Elon Alas   On: 12/18/2014 19:59   Medications:   . aspirin  324 mg  Oral Daily  . atorvastatin  80 mg Oral q1800  . calcium acetate  667 mg Oral TID WC  . darbepoetin (ARANESP) injection - DIALYSIS  150 mcg Intravenous Q Wed-HD  . dorzolamide-timolol  1 drop Left Eye BID  . feeding supplement (NEPRO CARB STEADY)  237 mL Oral Q24H  . feeding supplement (PRO-STAT SUGAR FREE 64)  30 mL Oral TID WC  . folic acid  1 mg Oral Daily  . heparin subcutaneous  5,000 Units Subcutaneous 3 times per day  . hydrALAZINE  50 mg Oral BID  . insulin glargine  15 Units Subcutaneous QHS  . loratadine  10 mg Oral Daily  . megestrol  40 mg Oral Daily  . Melatonin  6 mg Oral QHS  . metoprolol  50 mg Oral BID  . midodrine  10 mg Oral Q M,W,F-HD  . multivitamin  1 tablet Oral QHS  . pantoprazole  40 mg Oral Daily  . senna-docusate  1 tablet Oral Daily  . sodium chloride  3 mL Intravenous Q12H  . vitamin C  500 mg Oral BID

## 2014-12-20 NOTE — Progress Notes (Signed)
OT Cancellation Note  Patient Details Name: [name] James MRN: [id number] DOB: October 31, 1936   Cancelled Treatment:    Reason Eval/Treat Not Completed: Patient at procedure or test/ unavailable. Pt also on bed rest.   Benito Mccreedy OTR/L [id number]  12/20/2014, 9:30 AM

## 2014-12-20 NOTE — Progress Notes (Signed)
Several episodes of AF with RVR during dialysis. Rate control limited by tendency to hypotension. Digoxin unsafe. Will start amiodarone.  Stress nuclear study was low risk. No plan for cardiac cath. Normal LVEF.  Discussed recent GI bleed and need for anticoagulation with Dr. Carlean Purl, who is covering for Dr. Hilarie Fredrickson. He will kindly evaluate the need for colonoscopy before initiation of warfarin Rx.  Potential DC tomorrow, but if Dr. Carlean Purl believes she is best served by inpatient colonoscopy, would do it while she is here.  [name] Klein, MD, [facility] [phone] office [phone] pager

## 2014-12-21 ENCOUNTER — Telehealth: Payer: Self-pay | Admitting: Cardiovascular Disease

## 2014-12-21 DIAGNOSIS — I953 Hypotension of hemodialysis: Secondary | ICD-10-CM

## 2014-12-21 DIAGNOSIS — N186 End stage renal disease: Secondary | ICD-10-CM | POA: Diagnosis not present

## 2014-12-21 DIAGNOSIS — K921 Melena: Secondary | ICD-10-CM

## 2014-12-21 DIAGNOSIS — Z992 Dependence on renal dialysis: Secondary | ICD-10-CM | POA: Diagnosis not present

## 2014-12-21 LAB — CBC
HEMATOCRIT: 36.1 % (ref 36.0–46.0)
Hemoglobin: 10.6 g/dL — ABNORMAL LOW (ref 12.0–15.0)
MCH: 29.2 pg (ref 26.0–34.0)
MCHC: 29.4 g/dL — ABNORMAL LOW (ref 30.0–36.0)
MCV: 99.4 fL (ref 78.0–100.0)
PLATELETS: 177 10*3/uL (ref 150–400)
RBC: 3.63 MIL/uL — ABNORMAL LOW (ref 3.87–5.11)
RDW: 16.4 % — ABNORMAL HIGH (ref 11.5–15.5)
WBC: 9.7 10*3/uL (ref 4.0–10.5)

## 2014-12-21 LAB — GLUCOSE, CAPILLARY
GLUCOSE-CAPILLARY: 184 mg/dL — AB (ref 70–99)
GLUCOSE-CAPILLARY: 253 mg/dL — AB (ref 70–99)
Glucose-Capillary: 193 mg/dL — ABNORMAL HIGH (ref 70–99)
Glucose-Capillary: 178 mg/dL — ABNORMAL HIGH (ref 70–99)

## 2014-12-21 LAB — OCCULT BLOOD X 1 CARD TO LAB, STOOL: Fecal Occult Bld: POSITIVE — AB

## 2014-12-21 MED ORDER — INSULIN ASPART 100 UNIT/ML ~~LOC~~ SOLN: 0.0000 [IU] | Freq: Every day | SUBCUTANEOUS | Status: DC

## 2014-12-21 MED ORDER — INSULIN ASPART 100 UNIT/ML ~~LOC~~ SOLN
0.0000 [IU] | Freq: Three times a day (TID) | SUBCUTANEOUS | Status: DC
  Administered 2014-12-21 – 2014-12-22 (×2): 3 [IU] via SUBCUTANEOUS
  Administered 2014-12-22: 2 [IU] via SUBCUTANEOUS

## 2014-12-21 NOTE — Progress Notes (Signed)
[facility] PROGRESS NOTE   [name] James [id number] DOB: 03/27/1936 DOA: 12/18/2014 PCP: Shirline Frees, MD  HPI/Subjective: Melanotic stool this AM   Assessment/Plan: Principal Problem:   Chest pain Active Problems:   HTN (hypertension)   DM type 2, uncontrolled, with renal complications   Dyslipidemia   Iron deficiency anemia   Hemodialysis-associated hypotension   ESRD (end stage renal disease) on dialysis   CHF (congestive heart failure)   Palpitation   ESRD (end stage renal disease)   Atrial fib -rate controlled ASA Not a candidate for warfarin as still with melanotic stools- don't think patient will tolerate colonoscopy but appreciate Dr. Carlean Purl following along Follow CBC  Chest pain -stress test  ESRD -HD M/W/F -Dr. [name] to speak with daughter  Deconditioning -back to SNF when medically ready    Code Status: Full code Family Communication: daughter at bedside 12/30 Disposition Plan: Remains inpatient   Consultants:  Cardiology  renal  Procedures:  Stress test  Antibiotics:  None   Objective: Filed Vitals:   12/21/14 0729  BP: 120/91  Pulse: 77  Temp: 98 F (36.7 C)  Resp: 16    Intake/Output Summary (Last 24 hours) at 12/21/14 1235 Last data filed at 12/20/14 1705  Gross per 24 hour  Intake      0 ml  Output   3506 ml  Net  -3506 ml   Filed Weights   12/20/14 1230 12/20/14 1705 12/21/14 0400  Weight: 100 kg (220 lb 7.4 oz) 95 kg (209 lb 7 oz) 96.072 kg (211 lb 12.8 oz)    Exam: General: Alert and awake, oriented x3, not in any acute distress. Chest: clear to auscultation bilaterally, no wheezing, rales or rhonchi Abdomen: soft nontender, obese    Data Reviewed: Basic Metabolic Panel:  Recent Labs Lab 12/18/14 1918 12/19/14 0501 12/19/14 0750 12/20/14 0413  NA 141  --  140 138  K 3.0*  --  3.6 4.5  CL 102  --  101 101  CO2 27  --  24 23  GLUCOSE 166*  --  156* 188*  BUN 24*  --  31* 43*   CREATININE 3.54*  --  4.42* 5.49*  CALCIUM 8.3*  --  8.4 8.1*  MG  --  2.1  --   --    Liver Function Tests: No results for input(s): AST, ALT, ALKPHOS, BILITOT, PROT, ALBUMIN in the last 168 hours. No results for input(s): LIPASE, AMYLASE in the last 168 hours. No results for input(s): AMMONIA in the last 168 hours. CBC:  Recent Labs Lab 12/18/14 1918 12/19/14 0750 12/20/14 1311 12/21/14 0412  WBC 9.5 10.4 8.8 9.7  NEUTROABS 4.8  --   --   --   HGB 11.2* 10.6* 10.3* 10.6*  HCT 37.6 36.3 35.1* 36.1  MCV 98.2 98.9 97.2 99.4  PLT 159 211 182 177   Cardiac Enzymes:  Recent Labs Lab 12/18/14 1918 12/19/14 0501 12/19/14 0950 12/19/14 1605  TROPONINI 0.07* 0.09* 0.09* 0.08*   BNP (last 3 results)  Recent Labs  11/03/14 0500 11/08/14 0714 11/13/14 0500  PROBNP 1032.0* 1198.0* 4493.0*   CBG:  Recent Labs Lab 12/20/14 1135 12/20/14 1732 12/20/14 2107 12/21/14 0729 12/21/14 1157  GLUCAP 175* 157* 229* 184* 253*    Micro Recent Results (from the past 240 hour(s))  MRSA PCR Screening     Status: None   Collection Time: 12/19/14  1:32 PM  Result Value Ref Range Status   MRSA by PCR NEGATIVE  NEGATIVE Final    Comment:        The GeneXpert MRSA Assay (FDA approved for NASAL specimens only), is one component of a comprehensive MRSA colonization surveillance program. It is not intended to diagnose MRSA infection nor to guide or monitor treatment for MRSA infections.      Studies: Nm Myocar Multi W/spect W/wall Motion / Ef  12/20/2014   CLINICAL DATA:  Chest pain. CHF. Renal insufficiency. Myocardial ischemia. Abnormal cardiac enzymes.  EXAM: MYOCARDIAL IMAGING WITH SPECT (REST AND PHARMACOLOGIC-STRESS)  GATED LEFT VENTRICULAR WALL MOTION STUDY  LEFT VENTRICULAR EJECTION FRACTION  TECHNIQUE: Standard myocardial SPECT imaging was performed after resting intravenous injection of 10 mCi Tc-67m sestamibi. Subsequently, intravenous infusion of Lexiscan was  performed under the supervision of the Cardiology staff. At peak effect of the drug, 30 mCi Tc-59m sestamibi was injected intravenously and standard myocardial SPECT imaging was performed. Quantitative gated imaging was also performed to evaluate left ventricular wall motion, and estimate left ventricular ejection fraction.  COMPARISON:  12/18/2014 chest radiograph.  FINDINGS: Perfusion: Small area of fixed decreased perfusion is present in the mid inferior lateral wall. No reversible defects. No peri-infarct ischemia.  Wall Motion: Normal.  Left Ventricular Ejection Fraction: 68 %  End diastolic volume 59 ml  End systolic volume 19 ml  IMPRESSION: 1. No reversible ischemia.  Small mid inferolateral wall infarct.  2. Normal left ventricular wall motion.  3. Left ventricular ejection fraction 68%  4. Low-risk stress test findings*.  *2012 Appropriate Use Criteria for Coronary Revascularization Focused Update: J Am Coll Cardiol. N6492421. http://content.airportbarriers.com.aspx?articleid=1201161   Electronically Signed   By: Dereck Ligas M.D.   On: 12/20/2014 12:57    Scheduled Meds: . amiodarone  200 mg Oral BID  . aspirin  324 mg Oral Daily  . atorvastatin  80 mg Oral q1800  . calcium acetate  667 mg Oral TID WC  . darbepoetin (ARANESP) injection - DIALYSIS  150 mcg Intravenous Q Wed-HD  . dorzolamide-timolol  1 drop Left Eye BID  . feeding supplement (NEPRO CARB STEADY)  237 mL Oral Q24H  . feeding supplement (PRO-STAT SUGAR FREE 64)  30 mL Oral TID WC  . folic acid  1 mg Oral Daily  . hydrALAZINE  25 mg Oral BID  . insulin aspart  0-5 Units Subcutaneous QHS  . insulin aspart  0-9 Units Subcutaneous TID WC  . insulin glargine  15 Units Subcutaneous QHS  . loratadine  10 mg Oral Daily  . megestrol  40 mg Oral Daily  . Melatonin  6 mg Oral QHS  . metoprolol  50 mg Oral BID  . multivitamin  1 tablet Oral QHS  . pantoprazole  40 mg Oral Daily  . senna-docusate  1 tablet Oral Daily   . sodium chloride  3 mL Intravenous Q12H  . vitamin C  500 mg Oral BID   Continuous Infusions:       Time spent: 25 minutes    VANN, JESSICA  [facility] Pager [phone] If 7PM-7AM, please contact night-coverage at [email or web address], password [facility] 12/21/2014, 12:35 PM  LOS: 3 days

## 2014-12-21 NOTE — Evaluation (Signed)
Physical Therapy Evaluation Patient Details Name: [name] James MRN: [id number] DOB: 12-15-1936 Today's Date: 12/21/2014   History of Present Illness  [name] James is a 78 y.o. female with history of type 2 diabetes, hypertension, morbid obesity, ESRD on HD, who presents with chest pain and palpitations. Patient is a poor historian. The history was obtained from her son and patient herself. Patient was recently started on dialysis via temp dialysis cath. She was discharged to a SNF. She had been doing fine until yesterday. During her dialysis yesterday, she started having palpitations and irregular heartbeat. Her rate was up to nearly 160. She also had chest pain. Her chest pains was located in the substernal area, 8 out of 10 in severity, sharp, radiating to both arms. She does not have fever or chills. She has chronic mild cough with very little mucus production.    Clinical Impression  Pt currently with functional limitations due to generalized weakness, decreased mobility, and decreased endurance. Pt will benefit from skilled PT to increase independence and safety with mobility to allow discharge to SNF. Pt very willing to assist with what she could during treatment session. Pt stated that she is receiving PT/OT at [facility], but requires assistance for all ADLs and mobility. Pt able to roll to both sides in bed with min assist from 2 PTs. Pt will benefit from both acute PT to improve strength and mobility while at [facility] and PT once she is back at [facility] to work on increasing independence with ADLs and mobility.       Follow Up Recommendations SNF    Equipment Recommendations  None recommended by PT    Recommendations for Other Services       Precautions / Restrictions Precautions Precautions: Fall Restrictions Weight Bearing Restrictions: No      Mobility  Bed Mobility Overal bed mobility: Needs Assistance Bed Mobility: Rolling Rolling: Min assist;Mod assist         General bed mobility comments: Pt able to roll to both left and right with min assist from 2 PTs and with heavy use of railings. Pt able to maintain sidelying with heavy use of rail but without PT assistance for a few second on both sides.   Transfers Overall transfer level: Needs assistance               General transfer comment: Pt not want to attempt transfer to sitting EOB today.   Ambulation/Gait                Stairs            Wheelchair Mobility    Modified Rankin (Stroke Patients Only)       Balance                                             Pertinent Vitals/Pain Pain Assessment: No/denies pain    Home Living Family/patient expects to be discharged to:: Skilled nursing facility Living Arrangements: Children               Additional Comments: Pt reported that she has been at [facility] since her prior hospital admission. Pt stated she has been receiving PT and OT at [facility]. Pt stated she may be able to live with her daughter when she discharges rfom [facility].     Prior Function Level of Independence:  Needs assistance   Gait / Transfers Assistance Needed: Pt +2 assist for transfer and stated staff at [facility] typically use a mechanical lift to get her up into the chair.   ADL's / Homemaking Assistance Needed: Total 2+ assist  Comments: Patient at [facility] PTA and plans to discharge back today according to RN     Hand Dominance   Dominant Hand: Right (Prefers Right but due to tremors mostly uses Left now)    Extremity/Trunk Assessment   Upper Extremity Assessment: Generalized weakness           Lower Extremity Assessment: Generalized weakness      Cervical / Trunk Assessment: Normal  Communication   Communication: No difficulties  Cognition Arousal/Alertness: Awake/alert Behavior During Therapy: WFL for tasks assessed/performed Overall Cognitive Status: History of cognitive  impairments - at baseline       Memory: Decreased short-term memory              General Comments      Exercises General Exercises - Lower Extremity Ankle Circles/Pumps: AROM;Both;10 reps;Supine Quad Sets: Strengthening;Both;5 reps;Supine Gluteal Sets: Strengthening;Both;5 reps;Supine      Assessment/Plan    PT Assessment Patient needs continued PT services  PT Diagnosis Generalized weakness   PT Problem List Decreased strength;Decreased range of motion;Decreased activity tolerance;Decreased balance;Decreased mobility;Obesity  PT Treatment Interventions DME instruction;Functional mobility training;Therapeutic activities;Therapeutic exercise;Balance training;Patient/family education   PT Goals (Current goals can be found in the Care Plan section) Acute Rehab PT Goals Patient Stated Goal: Go back to [facility]  PT Goal Formulation: With patient Time For Goal Achievement: 01/04/15 Potential to Achieve Goals: Fair    Frequency Min 2X/week   Barriers to discharge        Co-evaluation               End of Session   Activity Tolerance: Patient limited by fatigue Patient left: in chair;with call bell/phone within reach           Time: 1004-1027 PT Time Calculation (min) (ACUTE ONLY): 23 min   Charges:   PT Evaluation $Initial PT Evaluation Tier I: 1 Procedure PT Treatments $Therapeutic Activity: 8-22 mins   PT G CodesJearld Shines SPT 12/21/2014, 10:39 AM  Jearld Shines, SPT  Acute Rehabilitation [phone]

## 2014-12-21 NOTE — Telephone Encounter (Signed)
Closed encounter °

## 2014-12-21 NOTE — Care Management Note (Signed)
    Page 1 of 1   12/21/2014     3:41:37 PM CARE MANAGEMENT NOTE 12/21/2014  Patient:  [name] James, [name] James   Account Number:  [id number]  Date Initiated:  12/21/2014  Documentation initiated by:  GRAVES-BIGELOW,Trinadee Verhagen  Subjective/Objective Assessment:   Pt admitted for Chest pain and palpitation. Pt is from [facility] and will plan to return once stable.     Action/Plan:   CSW to assist with disposition needs. No needs from CM at this time.   Anticipated DC Date:  12/23/2014   Anticipated DC Plan:  SKILLED NURSING FACILITY  In-house referral  Clinical Social Worker      DC Planning Services  CM consult      Choice offered to / List presented to:             Status of service:  Completed, signed off Medicare Important Message given?  YES (If response is "NO", the following Medicare IM given date fields will be blank) Date Medicare IM given:  12/21/2014 Medicare IM given by:  GRAVES-BIGELOW,Jalysa Swopes Date Additional Medicare IM given:   Additional Medicare IM given by:    Discharge Disposition:  [place]  Per UR Regulation:  Reviewed for med. necessity/level of care/duration of stay  If discussed at [place] of Stay Meetings, dates discussed:    Comments:

## 2014-12-21 NOTE — Progress Notes (Signed)
[facility] Progress Note  Assessment/Plan: 1. Chest pain With elavated cardiac enzymes- Cards following; had low risk nuclear study 2. Palpitations and Tachycaria on admit- PACs/PVCs on telemetry -cards was going to start warfarin  with goal INR 2 - 2.5 but given bloody stool today, cannot start 3. ESRD - HD MWF Recent start HD [facility]- next HD Saturday - here or at outpt HD unit per holiday schedule 4. Hypertension/volume -BP 110s - 140s, Midodrine DC'd sec to stable BP and up since admit / no pul edema on cxr/ Close to edw BUT establishing EDW as she is new to hd/ PRIOR Op meds noted Lasix can be DC since on HD now/ on Metoprolol 50mg  bid with ho SVT t/ a. Fib/ Taper down Hydralazine 50 mg to 25mg bid anticipating BP drops on hd- will have a new lower EDW at discharge. 5. Anemia - hgb 10.6 continue ESA , no iv fe (do note resume oral Fe at d/c)- having bloody stool today - no heparin next HD- follow serial Hgb 6. Metabolic bone disease - No vit d, phoslo 1 ac as op continue fu labs 7. IDDM- per admit 8. Obesity / Deconditioning With DJD - continue PT when cleared by [facility] 9. Depression - Zoloft dc'd (family request), still somewhat lethargic. Not getting any other scheduled narcotics or sedating meds, is getting prn oxy IR and prn qhs Valium 10. Nutrition - Renal /carb mod diet- not clear why she is on high dose vitamin C - should get adequate C in renavite; d/w with Dr. Eliseo Squires - will d/c 11. Debility - significant issue 12. EOL - have discussed EOL issues with both children and the patient today, given the pt's multiple chronic medical problems, acute complications and overall debility.  Patient requested no heroic measures today during bedside conversation with son and my P.A.  present. She said she "doesn't want any of that anymore".  She is not doing well overall and may continue to decline now that she has full blown ESRD.  Have d/w patient and  made her aware that at any time she is able to withdraw from dialysis if she so desires.    Myriam Jacobson, PA-C [place] 12/21/2014,12:27 PM  LOS: 3 days   Pt seen, examined and agree w A/P as above.  Kelly Splinter MD pager [phone]    cell [phone] 12/21/2014, 1:05 PM    Subjective:     Objective Filed Vitals:   12/20/14 2000 12/21/14 0000 12/21/14 0400 12/21/14 0729  BP: 141/43 117/41 147/38 120/91  Pulse: 77 71 75 77  Temp: 98.7 F (37.1 C) 99.5 F (37.5 C) 98.3 F (36.8 C) 98 F (36.7 C)  TempSrc: Oral Oral Oral Oral  Resp: 20 20 20 16   Height:      Weight:   96.072 kg (211 lb 12.8 oz)   SpO2: 100% 99% 95% 100%   Physical Exam General: Heart: Lungs: Abdomen: Extremities: Dialysis Access: Left IJ and maturing left lower AVF  Dialysis Orders: [facility] on MWF . EDW 100.5 kg HD Bath 2.0 k, 2.25ca Time 4hr Heparin 3300. Access L IJ PC, L FA AVF inserted 11/30/14  Vit d 0 mcg IV/HD Aranesp 150 q weekly Units IV/HD Venofer 0  Other Op labs hgb 10.8 No pth data available(new pt)  Additional Objective Labs: Basic Metabolic Panel:  Recent Labs Lab 12/18/14 1918 12/19/14 0750 12/20/14 0413  NA 141 140 138  K 3.0* 3.6  4.5  CL 102 101 101  CO2 27 24 23   GLUCOSE 166* 156* 188*  BUN 24* 31* 43*  CREATININE 3.54* 4.42* 5.49*  CALCIUM 8.3* 8.4 8.1*   CBC:  Recent Labs Lab 12/18/14 1918 12/19/14 0750 12/20/14 1311 12/21/14 0412  WBC 9.5 10.4 8.8 9.7  NEUTROABS 4.8  --   --   --   HGB 11.2* 10.6* 10.3* 10.6*  HCT 37.6 36.3 35.1* 36.1  MCV 98.2 98.9 97.2 99.4  PLT 159 211 182 177  Cardiac Enzymes:  Recent Labs Lab 12/18/14 1918 12/19/14 0501 12/19/14 0950 12/19/14 1605  TROPONINI 0.07* 0.09* 0.09* 0.08*   CBG:  Recent Labs Lab 12/20/14 1135 12/20/14 1732 12/20/14 2107 12/21/14 0729 12/21/14 1157  GLUCAP 175* 157* 229* 184* 253*  Studies/Results: Nm Myocar Multi W/spect W/wall  Motion / Ef  12/20/2014   CLINICAL DATA:  Chest pain. CHF. Renal insufficiency. Myocardial ischemia. Abnormal cardiac enzymes.  EXAM: MYOCARDIAL IMAGING WITH SPECT (REST AND PHARMACOLOGIC-STRESS)  GATED LEFT VENTRICULAR WALL MOTION STUDY  LEFT VENTRICULAR EJECTION FRACTION  TECHNIQUE: Standard myocardial SPECT imaging was performed after resting intravenous injection of 10 mCi Tc-8m sestamibi. Subsequently, intravenous infusion of Lexiscan was performed under the supervision of the Cardiology staff. At peak effect of the drug, 30 mCi Tc-75m sestamibi was injected intravenously and standard myocardial SPECT imaging was performed. Quantitative gated imaging was also performed to evaluate left ventricular wall motion, and estimate left ventricular ejection fraction.  COMPARISON:  12/18/2014 chest radiograph.  FINDINGS: Perfusion: Small area of fixed decreased perfusion is present in the mid inferior lateral wall. No reversible defects. No peri-infarct ischemia.  Wall Motion: Normal.  Left Ventricular Ejection Fraction: 68 %  End diastolic volume 59 ml  End systolic volume 19 ml  IMPRESSION: 1. No reversible ischemia.  Small mid inferolateral wall infarct.  2. Normal left ventricular wall motion.  3. Left ventricular ejection fraction 68%  4. Low-risk stress test findings*.  *2012 Appropriate Use Criteria for Coronary Revascularization Focused Update: J Am Coll Cardiol. N6492421. http://content.[email or web address].aspx?articleid=1201161   Electronically Signed   By: Dereck Ligas M.D.   On: 12/20/2014 12:57   Medications:   . amiodarone  200 mg Oral BID  . aspirin  324 mg Oral Daily  . atorvastatin  80 mg Oral q1800  . calcium acetate  667 mg Oral TID WC  . darbepoetin (ARANESP) injection - DIALYSIS  150 mcg Intravenous Q Wed-HD  . dorzolamide-timolol  1 drop Left Eye BID  . feeding supplement (NEPRO CARB STEADY)  237 mL Oral Q24H  . feeding supplement (PRO-STAT SUGAR FREE 64)  30 mL Oral  TID WC  . folic acid  1 mg Oral Daily  . hydrALAZINE  25 mg Oral BID  . insulin aspart  0-5 Units Subcutaneous QHS  . insulin aspart  0-9 Units Subcutaneous TID WC  . insulin glargine  15 Units Subcutaneous QHS  . loratadine  10 mg Oral Daily  . megestrol  40 mg Oral Daily  . Melatonin  6 mg Oral QHS  . metoprolol  50 mg Oral BID  . multivitamin  1 tablet Oral QHS  . pantoprazole  40 mg Oral Daily  . senna-docusate  1 tablet Oral Daily  . sodium chloride  3 mL Intravenous Q12H  . vitamin C  500 mg Oral BID

## 2014-12-21 NOTE — Evaluation (Signed)
Occupational Therapy Evaluation Patient Details Name: [name] James MRN: [id number] DOB: Dec 24, 1935 Today's Date: 12/21/2014    History of Present Illness JACLYNN LAUMANN is a 78 y.o. female with history of type 2 diabetes, hypertension, morbid obesity, ESRD on HD, who presents with chest pain and palpitations. Patient is a poor historian. The history was obtained from his son and patient herself. Patient was recently started on dialysis via temp dialysis cath. She was discharged to a SNF. She had been doing fine until yesterday. During her dialysis yesterday, she started having palpitations and irregular heartbeat. Her rate was up to nearly 160. She also had chest pain. Her chest pains was located in the substernal area, 8 out of 10 in severity, sharp, radiating to both arms. She does not have fever or chills. She has chronic mild cough with very little mucus production.    Clinical Impression   Patient admitted with above. Patient total-total+2 PTA. Patient currently functioning at a total - total +2 level. Please see OT problem list below. Clinically believe all needs can be met in next venue of care. According to patient and RN, patient to d/c back to [facility] today. Patient will benefit from continued skilled OT at SNF.     Follow Up Recommendations  SNF;Supervision/Assistance - 24 hour    Equipment Recommendations   (Defer to next venue)    Recommendations for Other Services  None at this time.      Precautions / Restrictions Precautions Precautions: Fall Restrictions Weight Bearing Restrictions: No      Mobility Bed Mobility Overal bed mobility: +2 for physical assistance General bed mobility comments: Rolling left <> right patient required +2 assist  Transfers Overall transfer level: Needs assistance General transfer comment: Transfer not completed at this time, patient will require mechanical lift for transfer    Balance  Defer to PT evaluation     ADL  Overall ADL's : Needs assistance/impaired Eating/Feeding: Set up   Grooming: Minimal assistance;Bed level   Upper Body Bathing: Bed level;Minimal assitance   Lower Body Bathing: Bed level;+2 for physical assistance   Upper Body Dressing : Minimal assistance;Bed level   Lower Body Dressing: +2 for physical assistance;Bed level   Toilet Transfer: +2 for physical assistance Toilet Transfer Details (indicate cue type and reason): requires mechanical lift Toileting- Clothing Manipulation and Hygiene: +2 for physical assistance;Bed level General ADL Comments: Patient required overal total - total +2 PTA for ADLs and functional mobility/transfers.  Therapist and RN assisted patient with bed mobility to place bedpan and to re-position in bed. Therapist encouraged patient to engage in/perform UE exercises to regain strength in order to increase her overall independence with self-care tasks and improve her quality of life.      Vision  No change from baseline         Pertinent Vitals/Pain Pain Assessment:  (patient with complaints of stomach discomoft, RN aware)     Hand Dominance  ("both" according to patient)   Extremity/Trunk Assessment Upper Extremity Assessment Upper Extremity Assessment: Generalized weakness   Lower Extremity Assessment Lower Extremity Assessment: Defer to PT evaluation   Cervical / Trunk Assessment Cervical / Trunk Assessment: Normal   Communication Communication Communication: No difficulties   Cognition Arousal/Alertness: Awake/alert Behavior During Therapy: WFL for tasks assessed/performed Overall Cognitive Status: No family/caregiver present to determine baseline cognitive functioning       Memory: Decreased short-term memory              Home  Living Family/patient expects to be discharged to:: Skilled nursing facility Additional Comments: Patient reports that she's been living at [facility] receiving OT/PT routinely. She presents as a poor  historian and is unable to provide anymore information than that.       Prior Functioning/Environment Level of Independence: Needs assistance  Gait / Transfers Assistance Needed: Total +2, requiring mechanical lift ADL's / Homemaking Assistance Needed: Total-Total +2 Communication / Swallowing Assistance Needed: Independent Comments: Patient at [facility] PTA and plans to discharge back today according to RN    OT Diagnosis: Generalized weakness;Acute pain   OT Problem List: Decreased strength;Decreased activity tolerance;Impaired balance (sitting and/or standing);Decreased coordination;Decreased safety awareness;Decreased knowledge of use of DME or AE;Obesity;Pain;Impaired UE functional use   OT Treatment/Interventions:   n/a, no acute OT needs at this time   OT Goals(Current goals can be found in the care plan section) Acute Rehab OT Goals Patient Stated Goal: Go back to [facility]  OT Goal Formulation:  (n/a, no acute OT needs) Time For Goal Achievement:  (n/a, no acute OT needs) Potential to Achieve Goals:  (n/a, no acute OT needs)  OT Frequency:     Barriers to D/C:  None known, plan is for patient to d/c [facility] for additional SNF rehab          End of Session Equipment Utilized During Treatment: Oxygen Nurse Communication: Mobility status  Activity Tolerance: Patient tolerated treatment well Patient left: in bed;with call bell/phone within reach;with nursing/sitter in room   Time: 1610-9604 OT Time Calculation (min): 20 min Charges:  OT General Charges $OT Visit: 1 Procedure OT Evaluation $Initial OT Evaluation Tier I: 1 Procedure OT Treatments $Self Care/Home Management : 8-22 mins Terrence Wishon , MS, OTR/L, CLT 12/21/2014, 9:15 AM

## 2014-12-21 NOTE — Progress Notes (Addendum)
CSW (Clinical Education officer, museum) notified by [employer] that pt family upset and wanting to use [facility] transportation. CSW did discuss with pt earlier today that [facility] is typically used to transport pts back to facilities. Pt family expressed to facility they would rather pay minimal amount now than take the chance of receiving bill from non-emergent ambulance. [place] working on finding pt wheel chair at this time. CSW did call [facility] and cost would be roughly $85. They would be able to pick up pt wheel chair prior to picking up pt. Pt family would need to pay drive. CSW working on resolving issue of pt oxygen during transport. CSW to continue to follow and arrange necessary transport when pt is ready for dc.   ADDENDUM 11:27am: CSW spoke with [facility] and they have pt wheelchair and will provide oxygen tank. CSW spoke with pt son and updated that we will arrange transport. CSW notified pt son of payment options relayed by [facility]. Donnie plans on bringing check with him tomorrow to pay for transport.   [place], [place]

## 2014-12-21 NOTE — Progress Notes (Signed)
Patient Name: [name] James Date of Encounter: 12/21/2014  Principal Problem:   Chest pain Active Problems:   HTN (hypertension)   DM type 2, uncontrolled, with renal complications   Dyslipidemia   Iron deficiency anemia   Hemodialysis-associated hypotension   ESRD (end stage renal disease) on dialysis   CHF (congestive heart failure)   Palpitation   ESRD (end stage renal disease)   Length of Stay: 3  SUBJECTIVE  No dyspnea lying fully supine, no chest pain. Recurrent AF RVR during dialysis, none overnight. No hypotension. Low risk nuclear study.  CURRENT MEDS . amiodarone  200 mg Oral BID  . aspirin  324 mg Oral Daily  . atorvastatin  80 mg Oral q1800  . calcium acetate  667 mg Oral TID WC  . darbepoetin (ARANESP) injection - DIALYSIS  150 mcg Intravenous Q Wed-HD  . dorzolamide-timolol  1 drop Left Eye BID  . feeding supplement (NEPRO CARB STEADY)  237 mL Oral Q24H  . feeding supplement (PRO-STAT SUGAR FREE 64)  30 mL Oral TID WC  . folic acid  1 mg Oral Daily  . heparin subcutaneous  5,000 Units Subcutaneous 3 times per day  . hydrALAZINE  25 mg Oral BID  . insulin glargine  15 Units Subcutaneous QHS  . loratadine  10 mg Oral Daily  . megestrol  40 mg Oral Daily  . Melatonin  6 mg Oral QHS  . metoprolol  50 mg Oral BID  . multivitamin  1 tablet Oral QHS  . pantoprazole  40 mg Oral Daily  . senna-docusate  1 tablet Oral Daily  . sodium chloride  3 mL Intravenous Q12H  . vitamin C  500 mg Oral BID    OBJECTIVE   Intake/Output Summary (Last 24 hours) at 12/21/14 0840 Last data filed at 12/20/14 1705  Gross per 24 hour  Intake      0 ml  Output   3506 ml  Net  -3506 ml   Filed Weights   12/20/14 1230 12/20/14 1705 12/21/14 0400  Weight: 220 lb 7.4 oz (100 kg) 209 lb 7 oz (95 kg) 211 lb 12.8 oz (96.072 kg)    PHYSICAL EXAM Filed Vitals:   12/20/14 2000 12/21/14 0000 12/21/14 0400 12/21/14 0729  BP: 141/43 117/41 147/38 120/91  Pulse: 77 71 75 77   Temp: 98.7 F (37.1 C) 99.5 F (37.5 C) 98.3 F (36.8 C) 98 F (36.7 C)  TempSrc: Oral Oral Oral Oral  Resp: 20 20 20 16   Height:      Weight:   211 lb 12.8 oz (96.072 kg)   SpO2: 100% 99% 95% 100%   General: Alert, oriented x3, no distress Head: no evidence of trauma, PERRL, EOMI, no exophtalmos or lid lag, no myxedema, no xanthelasma; normal ears, nose and oropharynx Neck: normal jugular venous pulsations and no hepatojugular reflux; brisk carotid pulses without delay and no carotid bruits Chest: clear to auscultation, no signs of consolidation by percussion or palpation, normal fremitus, symmetrical and full respiratory excursions, tunneled left SCl dialysis catheter Cardiovascular: normal position and quality of the apical impulse, regular rhythm, normal first and second heart sounds, no rubs or gallops, 2/6 holosystolic murmur Abdomen: no tenderness or distention, no masses by palpation, no abnormal pulsatility or arterial bruits, normal bowel sounds, no hepatosplenomegaly Extremities: fistula left wrist with excellent bruit and thrill, no clubbing, cyanosis or edema; 2+ radial, ulnar and brachial pulses bilaterally; 2+ right femoral, posterior tibial and dorsalis pedis pulses; 2+  left femoral, posterior tibial and dorsalis pedis pulses; no subclavian or femoral bruits Neurological: grossly nonfocal  LABS  CBC  Recent Labs  12/18/14 1918  12/20/14 1311 12/21/14 0412  WBC 9.5  < > 8.8 9.7  NEUTROABS 4.8  --   --   --   HGB 11.2*  < > 10.3* 10.6*  HCT 37.6  < > 35.1* 36.1  MCV 98.2  < > 97.2 99.4  PLT 159  < > 182 177  < > = values in this interval not displayed. Basic Metabolic Panel  Recent Labs  12/19/14 0501 12/19/14 0750 12/20/14 0413  NA  --  140 138  K  --  3.6 4.5  CL  --  101 101  CO2  --  24 23  GLUCOSE  --  156* 188*  BUN  --  31* 43*  CREATININE  --  4.42* 5.49*  CALCIUM  --  8.4 8.1*  MG 2.1  --   --    Liver Function Tests No results for  input(s): AST, ALT, ALKPHOS, BILITOT, PROT, ALBUMIN in the last 72 hours. No results for input(s): LIPASE, AMYLASE in the last 72 hours. Cardiac Enzymes  Recent Labs  12/19/14 0501 12/19/14 0950 12/19/14 1605  TROPONINI 0.09* 0.09* 0.08*   Radiology Studies Imaging results have been reviewed and Nm Myocar Multi W/spect W/wall Motion / Ef  12/20/2014   CLINICAL DATA:  Chest pain. CHF. Renal insufficiency. Myocardial ischemia. Abnormal cardiac enzymes.  EXAM: MYOCARDIAL IMAGING WITH SPECT (REST AND PHARMACOLOGIC-STRESS)  GATED LEFT VENTRICULAR WALL MOTION STUDY  LEFT VENTRICULAR EJECTION FRACTION  TECHNIQUE: Standard myocardial SPECT imaging was performed after resting intravenous injection of 10 mCi Tc-93m sestamibi. Subsequently, intravenous infusion of Lexiscan was performed under the supervision of the Cardiology staff. At peak effect of the drug, 30 mCi Tc-78m sestamibi was injected intravenously and standard myocardial SPECT imaging was performed. Quantitative gated imaging was also performed to evaluate left ventricular wall motion, and estimate left ventricular ejection fraction.  COMPARISON:  12/18/2014 chest radiograph.  FINDINGS: Perfusion: Small area of fixed decreased perfusion is present in the mid inferior lateral wall. No reversible defects. No peri-infarct ischemia.  Wall Motion: Normal.  Left Ventricular Ejection Fraction: 68 %  End diastolic volume 59 ml  End systolic volume 19 ml  IMPRESSION: 1. No reversible ischemia.  Small mid inferolateral wall infarct.  2. Normal left ventricular wall motion.  3. Left ventricular ejection fraction 68%  4. Low-risk stress test findings*.  *2012 Appropriate Use Criteria for Coronary Revascularization Focused Update: J Am Coll Cardiol. [id number]. http://content.airportbarriers.com.aspx?articleid=[email or web address]   Electronically Signed   By: Dereck Ligas M.D.   On: 12/20/2014 12:57    TELE Occ PACs/PVCs, no further atrial  fibrillation  ASSESSMENT AND PLAN   Risk of embolic stroke is substantial, as is risk of recurrent GI bleeding. Will start warfarin and plan to keep INR 2.0-2.5. Dr. Celesta Aver input is appreciated. No colonoscopy planned in the immediate future. Amiodarone 200 mg BID for 4 weeks, then 200 mg daily. F/u Cardiology clinic 2-3 weeks - will arrange.  Sanda Klein, MD, [facility] [phone] office [phone] pager 12/21/2014 8:40 AM

## 2014-12-22 DIAGNOSIS — I5032 Chronic diastolic (congestive) heart failure: Secondary | ICD-10-CM | POA: Diagnosis not present

## 2014-12-22 DIAGNOSIS — E1129 Type 2 diabetes mellitus with other diabetic kidney complication: Secondary | ICD-10-CM | POA: Diagnosis not present

## 2014-12-22 DIAGNOSIS — I4891 Unspecified atrial fibrillation: Secondary | ICD-10-CM | POA: Diagnosis not present

## 2014-12-22 DIAGNOSIS — R278 Other lack of coordination: Secondary | ICD-10-CM | POA: Diagnosis not present

## 2014-12-22 DIAGNOSIS — E119 Type 2 diabetes mellitus without complications: Secondary | ICD-10-CM | POA: Diagnosis not present

## 2014-12-22 DIAGNOSIS — E46 Unspecified protein-calorie malnutrition: Secondary | ICD-10-CM | POA: Diagnosis not present

## 2014-12-22 DIAGNOSIS — I953 Hypotension of hemodialysis: Secondary | ICD-10-CM | POA: Diagnosis not present

## 2014-12-22 DIAGNOSIS — E785 Hyperlipidemia, unspecified: Secondary | ICD-10-CM | POA: Diagnosis not present

## 2014-12-22 DIAGNOSIS — D631 Anemia in chronic kidney disease: Secondary | ICD-10-CM | POA: Diagnosis not present

## 2014-12-22 DIAGNOSIS — Z4931 Encounter for adequacy testing for hemodialysis: Secondary | ICD-10-CM | POA: Diagnosis not present

## 2014-12-22 DIAGNOSIS — R110 Nausea: Secondary | ICD-10-CM | POA: Diagnosis not present

## 2014-12-22 DIAGNOSIS — R002 Palpitations: Secondary | ICD-10-CM | POA: Diagnosis not present

## 2014-12-22 DIAGNOSIS — D638 Anemia in other chronic diseases classified elsewhere: Secondary | ICD-10-CM | POA: Diagnosis not present

## 2014-12-22 DIAGNOSIS — R2689 Other abnormalities of gait and mobility: Secondary | ICD-10-CM | POA: Diagnosis not present

## 2014-12-22 DIAGNOSIS — N200 Calculus of kidney: Secondary | ICD-10-CM | POA: Diagnosis not present

## 2014-12-22 DIAGNOSIS — R079 Chest pain, unspecified: Secondary | ICD-10-CM | POA: Diagnosis not present

## 2014-12-22 DIAGNOSIS — D72829 Elevated white blood cell count, unspecified: Secondary | ICD-10-CM | POA: Diagnosis not present

## 2014-12-22 DIAGNOSIS — N186 End stage renal disease: Secondary | ICD-10-CM | POA: Diagnosis not present

## 2014-12-22 DIAGNOSIS — Z794 Long term (current) use of insulin: Secondary | ICD-10-CM | POA: Diagnosis not present

## 2014-12-22 DIAGNOSIS — R112 Nausea with vomiting, unspecified: Secondary | ICD-10-CM | POA: Diagnosis not present

## 2014-12-22 DIAGNOSIS — K921 Melena: Secondary | ICD-10-CM | POA: Diagnosis not present

## 2014-12-22 DIAGNOSIS — I480 Paroxysmal atrial fibrillation: Secondary | ICD-10-CM | POA: Diagnosis not present

## 2014-12-22 DIAGNOSIS — K2970 Gastritis, unspecified, without bleeding: Secondary | ICD-10-CM | POA: Diagnosis not present

## 2014-12-22 DIAGNOSIS — I10 Essential (primary) hypertension: Secondary | ICD-10-CM | POA: Diagnosis not present

## 2014-12-22 DIAGNOSIS — K2990 Gastroduodenitis, unspecified, without bleeding: Secondary | ICD-10-CM | POA: Diagnosis not present

## 2014-12-22 DIAGNOSIS — M6281 Muscle weakness (generalized): Secondary | ICD-10-CM | POA: Diagnosis not present

## 2014-12-22 DIAGNOSIS — R109 Unspecified abdominal pain: Secondary | ICD-10-CM | POA: Diagnosis not present

## 2014-12-22 DIAGNOSIS — R195 Other fecal abnormalities: Secondary | ICD-10-CM | POA: Diagnosis not present

## 2014-12-22 DIAGNOSIS — Z992 Dependence on renal dialysis: Secondary | ICD-10-CM | POA: Diagnosis not present

## 2014-12-22 DIAGNOSIS — K219 Gastro-esophageal reflux disease without esophagitis: Secondary | ICD-10-CM | POA: Diagnosis not present

## 2014-12-22 DIAGNOSIS — I5042 Chronic combined systolic (congestive) and diastolic (congestive) heart failure: Secondary | ICD-10-CM | POA: Diagnosis not present

## 2014-12-22 DIAGNOSIS — Z23 Encounter for immunization: Secondary | ICD-10-CM | POA: Diagnosis not present

## 2014-12-22 DIAGNOSIS — D649 Anemia, unspecified: Secondary | ICD-10-CM | POA: Diagnosis not present

## 2014-12-22 DIAGNOSIS — E1122 Type 2 diabetes mellitus with diabetic chronic kidney disease: Secondary | ICD-10-CM | POA: Diagnosis not present

## 2014-12-22 DIAGNOSIS — I120 Hypertensive chronic kidney disease with stage 5 chronic kidney disease or end stage renal disease: Secondary | ICD-10-CM | POA: Diagnosis not present

## 2014-12-22 DIAGNOSIS — R5381 Other malaise: Secondary | ICD-10-CM | POA: Diagnosis not present

## 2014-12-22 DIAGNOSIS — D62 Acute posthemorrhagic anemia: Secondary | ICD-10-CM | POA: Diagnosis not present

## 2014-12-22 DIAGNOSIS — F419 Anxiety disorder, unspecified: Secondary | ICD-10-CM | POA: Diagnosis not present

## 2014-12-22 DIAGNOSIS — G4700 Insomnia, unspecified: Secondary | ICD-10-CM | POA: Diagnosis not present

## 2014-12-22 DIAGNOSIS — F411 Generalized anxiety disorder: Secondary | ICD-10-CM | POA: Diagnosis not present

## 2014-12-22 DIAGNOSIS — E1165 Type 2 diabetes mellitus with hyperglycemia: Secondary | ICD-10-CM | POA: Diagnosis not present

## 2014-12-22 DIAGNOSIS — N281 Cyst of kidney, acquired: Secondary | ICD-10-CM | POA: Diagnosis not present

## 2014-12-22 DIAGNOSIS — I150 Renovascular hypertension: Secondary | ICD-10-CM | POA: Diagnosis not present

## 2014-12-22 LAB — BASIC METABOLIC PANEL
Anion gap: 13 (ref 5–15)
BUN: 45 mg/dL — ABNORMAL HIGH (ref 6–23)
CO2: 22 mmol/L (ref 19–32)
Calcium: 8.6 mg/dL (ref 8.4–10.5)
Chloride: 101 mEq/L (ref 96–112)
Creatinine, Ser: 5.06 mg/dL — ABNORMAL HIGH (ref 0.50–1.10)
GFR calc Af Amer: 9 mL/min — ABNORMAL LOW (ref >90)
GFR calc non Af Amer: 8 mL/min — ABNORMAL LOW (ref >90)
Glucose, Bld: 211 mg/dL — ABNORMAL HIGH (ref 70–99)
Potassium: 3.9 mmol/L (ref 3.5–5.1)
Sodium: 136 mmol/L (ref 135–145)

## 2014-12-22 LAB — CBC
HEMATOCRIT: 35.3 % — AB (ref 36.0–46.0)
Hemoglobin: 10.4 g/dL — ABNORMAL LOW (ref 12.0–15.0)
MCH: 28.6 pg (ref 26.0–34.0)
MCHC: 29.5 g/dL — AB (ref 30.0–36.0)
MCV: 97 fL (ref 78.0–100.0)
Platelets: 197 10*3/uL (ref 150–400)
RBC: 3.64 MIL/uL — AB (ref 3.87–5.11)
RDW: 16.3 % — AB (ref 11.5–15.5)
WBC: 9.1 10*3/uL (ref 4.0–10.5)

## 2014-12-22 LAB — GLUCOSE, CAPILLARY
GLUCOSE-CAPILLARY: 226 mg/dL — AB (ref 70–99)
Glucose-Capillary: 192 mg/dL — ABNORMAL HIGH (ref 70–99)

## 2014-12-22 MED ORDER — POTASSIUM CHLORIDE CRYS ER 10 MEQ PO TBCR
EXTENDED_RELEASE_TABLET | ORAL | Status: AC
  Administered 2014-12-22: 40 meq
  Filled 2014-12-22: qty 4

## 2014-12-22 MED ORDER — ASPIRIN 81 MG PO CHEW: 324.0000 mg | CHEWABLE_TABLET | Freq: Every day | ORAL | Status: DC

## 2014-12-22 MED ORDER — DARBEPOETIN ALFA 150 MCG/0.3ML IJ SOSY: 150.0000 ug | PREFILLED_SYRINGE | INTRAMUSCULAR | Status: DC

## 2014-12-22 MED ORDER — OXYCODONE HCL 5 MG PO TABS: 2.5000 mg | ORAL_TABLET | ORAL | Status: DC | PRN

## 2014-12-22 MED ORDER — INSULIN GLARGINE 100 UNIT/ML ~~LOC~~ SOLN: 15.0000 [IU] | Freq: Every day | SUBCUTANEOUS | Status: DC

## 2014-12-22 MED ORDER — DIAZEPAM 5 MG PO TABS: ORAL_TABLET | ORAL | Status: DC

## 2014-12-22 MED ORDER — AMIODARONE HCL 200 MG PO TABS
200.0000 mg | ORAL_TABLET | Freq: Two times a day (BID) | ORAL | Status: DC
Start: 2014-12-22 — End: 2015-06-08

## 2014-12-22 MED ORDER — ATORVASTATIN CALCIUM 80 MG PO TABS: 80.0000 mg | ORAL_TABLET | Freq: Every day | ORAL | Status: DC

## 2014-12-22 NOTE — Discharge Summary (Addendum)
Physician Discharge Summary  [name] James [id number] DOB: [birth year] DOA: 12/18/2014  PCP: Shirline Frees, MD  Admit date: 12/18/2014 Discharge date: 12/22/2014  Time spent: 35 minutes  Recommendations for Outpatient Follow-up:  Cbc 1 week HD on Saturday then regular schedule amio 200 mg BID for 4 weeks then 200 mg daily DNR Renal/carb mod diet Continue 7 days a week of rehab Continue O2 as needed  Discharge Diagnoses:  Principal Problem:   Chest pain Active Problems:   HTN (hypertension)   DM type 2, uncontrolled, with renal complications   Dyslipidemia   Iron deficiency anemia   Hemodialysis-associated hypotension   ESRD (end stage renal disease) on dialysis   CHF (congestive heart failure)   Palpitation   ESRD (end stage renal disease)   End stage renal disease   Melena   Atrial fibrillation   Discharge Condition: stable  Diet recommendation: renal/carb mod  Filed Weights   12/20/14 1705 12/21/14 0400 12/22/14 0400  Weight: 95 kg (209 lb 7 oz) 96.072 kg (211 lb 12.8 oz) 96.009 kg (211 lb 10.6 oz)    History of present illness:  [name] James is a 79 y.o. female with history of type 2 diabetes, hypertension, morbid obesity, ESRD on HD, who presents with chest pain and palpitations.  Patient is a poor historian. The history was obtained from his son and patient herself. Patient was recently started on dialysis via temp dialysis cath. She was discharged to a SNF. She had been doing fine until yesterday. During her dialysis yesterday, she started having palpitations and irregular heartbeat. Her rate was up to nearly 160. She also had chest pain. Her chest pains was located in the substernal area, 8 out of 10 in severity, sharp, radiating to both arms. She does not have fever or chills. She has chronic mild cough with very little mucus production.   She denies fever, chills, headaches, abdominal pain, diarrhea, constipation, dysuria, urgency, frequency,  hematuria, skin rashes, or leg swelling.  Work up in the ED demonstrates elevated troponin 0.07. EKG showed nonspecific T-wave change in lead II and aVF. No leukocytosis. Potassium is 3.0.   Hospital Course:  Atrial fib -rate controlled- on amio- 200 mg bid x 4 weeks then 200 mg daily ASA Not a candidate for warfarin as still with melanotic stools (none in last 24 hours)- don't think patient will tolerate colonoscopy at this point but can follow with GI outpatient Follow CBC with dialysis- has been stable last 3 days  Chest pain -stress test  ESRD -HD M/W/F- for HD this Saturday due to holiday schedule -Dr. [name] to speak with daughter and patient about continuing  Deconditioning -back to SNF   Procedures:  HD  Stress test  Consultations:  Renal  cards  Discharge Exam: Filed Vitals:   12/22/14 1031  BP:   Pulse: 80  Temp:   Resp:     General: A+Ox3, NAD- chronically ill appearing Cardiovascular: rrr Respiratory: diminshed  Discharge Instructions   Discharge Instructions    Discharge instructions    Complete by:  As directed   Cbc 1 week HD on Saturday then regular schedule amio 200 mg BID for 4 weeks then 200 mg daily DNR Renal/carb mod diet     Increase activity slowly    Complete by:  As directed           Current Discharge Medication List    START taking these medications   Details  amiodarone (PACERONE) 200 MG tablet Take  1 tablet (200 mg total) by mouth 2 (two) times daily.    aspirin 81 MG chewable tablet Chew 4 tablets (324 mg total) by mouth daily.    atorvastatin (LIPITOR) 80 MG tablet Take 1 tablet (80 mg total) by mouth daily at 6 PM.    Darbepoetin Alfa (ARANESP) 150 MCG/0.3ML SOSY injection Inject 0.3 mLs (150 mcg total) into the vein every Wednesday with hemodialysis. Qty: 1.68 mL      CONTINUE these medications which have CHANGED   Details  diazepam (VALIUM) 5 MG tablet Take 1/2 tablet by mouth once at bedtime for  rest Qty: 10 tablet, Refills: 0    oxyCODONE (OXY IR/ROXICODONE) 5 MG immediate release tablet Take 0.5 tablets (2.5 mg total) by mouth every 4 (four) hours as needed for severe pain. Qty: 30 tablet, Refills: 0      CONTINUE these medications which have NOT CHANGED   Details  Amino Acids-Protein Hydrolys (FEEDING SUPPLEMENT, PRO-STAT SUGAR FREE 64,) LIQD Take 30 mLs by mouth 3 (three) times daily with meals.    b complex-vitamin c-folic acid (NEPHRO-VITE) 0.8 MG TABS tablet Take 1 tablet by mouth at bedtime.    calcium acetate (PHOSLO) 667 MG capsule Take 1 capsule (667 mg total) by mouth 3 (three) times daily with meals.    dorzolamide-timolol (COSOPT) 22.3-6.8 MG/ML ophthalmic solution Place 1 drop into the left eye 2 (two) times daily.     folic acid (FOLVITE) 1 MG tablet Take 1 mg by mouth daily.    insulin aspart (NOVOLOG) 100 UNIT/ML injection Inject 0-9 Units into the skin 3 (three) times daily before meals. Sliding scale    insulin detemir (LEVEMIR) 100 UNIT/ML injection Inject 20 Units into the skin at bedtime.    loratadine (CLARITIN) 10 MG tablet Take 10 mg by mouth daily.    megestrol (MEGACE) 40 MG/ML suspension Take 40 mg by mouth daily.    Melatonin 3 MG TABS Take 6 mg by mouth at bedtime.    metoprolol (LOPRESSOR) 50 MG tablet Take 50 mg by mouth 2 (two) times daily.    midodrine (PROAMATINE) 10 MG tablet Take 10 mg by mouth every Monday, Wednesday, and Friday.    pantoprazole (PROTONIX) 40 MG tablet Take 1 tablet (40 mg total) by mouth daily.    promethazine (PHENERGAN) 25 MG tablet Take 25 mg by mouth 2 (two) times daily as needed for nausea or vomiting.    sennosides-docusate sodium (SENOKOT-S) 8.6-50 MG tablet Take 1 tablet by mouth daily.    hydrALAZINE (APRESOLINE) 25 MG tablet Take 3 tablets (75 mg total) by mouth every 8 (eight) hours.    Nutritional Supplements (FEEDING SUPPLEMENT, NEPRO CARB STEADY,) LIQD Take 237 mLs by mouth daily. Refills: 0     ondansetron (ZOFRAN) 4 MG/2ML SOLN injection Inject 2 mLs (4 mg total) into the vein every 6 (six) hours as needed for nausea. Qty: 2 mL, Refills: 0    polyethylene glycol (MIRALAX / GLYCOLAX) packet Take 17 g by mouth daily as needed for mild constipation. Qty: 14 each, Refills: 0      STOP taking these medications     Darbepoetin Alfa (ARANESP) 100 MCG/0.5ML SOSY injection      ferrous sulfate 325 (65 FE) MG tablet      heparin 5000 UNIT/ML injection      sertraline (ZOLOFT) 100 MG tablet      vitamin C (ASCORBIC ACID) 500 MG tablet      enoxaparin (LOVENOX) 30 MG/0.3ML injection  furosemide (LASIX) 80 MG tablet      hydrALAZINE (APRESOLINE) 20 MG/ML injection      insulin glargine (LANTUS) 100 UNIT/ML injection      potassium chloride SA (K-DUR,KLOR-CON) 20 MEQ tablet      simvastatin (ZOCOR) 20 MG tablet        Allergies  Allergen Reactions  . Erythromycin Swelling  . Penicillins     She has taken keflex many times without porblems  . Codeine Rash      The results of significant diagnostics from this hospitalization (including imaging, microbiology, ancillary and laboratory) are listed below for reference.    Significant Diagnostic Studies: Nm Myocar Multi W/spect W/wall Motion / Ef  12/20/2014   CLINICAL DATA:  Chest pain. CHF. Renal insufficiency. Myocardial ischemia. Abnormal cardiac enzymes.  EXAM: MYOCARDIAL IMAGING WITH SPECT (REST AND PHARMACOLOGIC-STRESS)  GATED LEFT VENTRICULAR WALL MOTION STUDY  LEFT VENTRICULAR EJECTION FRACTION  TECHNIQUE: Standard myocardial SPECT imaging was performed after resting intravenous injection of 10 mCi Tc-85m sestamibi. Subsequently, intravenous infusion of Lexiscan was performed under the supervision of the Cardiology staff. At peak effect of the drug, 30 mCi Tc-3m sestamibi was injected intravenously and standard myocardial SPECT imaging was performed. Quantitative gated imaging was also performed to evaluate left  ventricular wall motion, and estimate left ventricular ejection fraction.  COMPARISON:  12/18/2014 chest radiograph.  FINDINGS: Perfusion: Small area of fixed decreased perfusion is present in the mid inferior lateral wall. No reversible defects. No peri-infarct ischemia.  Wall Motion: Normal.  Left Ventricular Ejection Fraction: 68 %  End diastolic volume 59 ml  End systolic volume 19 ml  IMPRESSION: 1. No reversible ischemia.  Small mid inferolateral wall infarct.  2. Normal left ventricular wall motion.  3. Left ventricular ejection fraction 68%  4. Low-risk stress test findings*.  *2012 Appropriate Use Criteria for Coronary Revascularization Focused Update: J Am Coll Cardiol. N6492421. http://content.airportbarriers.com.aspx?articleid=1201161   Electronically Signed   By: Dereck Ligas M.D.   On: 12/20/2014 12:57   Dg Chest Port 1 View  12/18/2014   CLINICAL DATA:  Irregular heart rate at dialysis today, hand tremor. History CHF, renal insufficiency, breast cancer.  EXAM: PORTABLE CHEST - 1 VIEW  COMPARISON:  Chest radiograph November 30, 2014  FINDINGS: Cardiac silhouette is unremarkable and unchanged. Mildly calcified aortic knob. Hazy density in the LEFT lung base. Tunneled dialysis catheter via LEFT internal jugular venous approach with distal tips in the cavoatrial junction, unchanged. No pneumothorax. Soft tissue planes and included osseous structures are nonsuspicious. Interval removal of RIGHT internal jugular intravenous line.  IMPRESSION: Hazy density in LEFT lung base may reflect small layering pleural effusion, no frank pulmonary edema on today's examination.   Electronically Signed   By: Elon Alas   On: 12/18/2014 19:59   Dg Chest Port 1 View  11/30/2014   CLINICAL DATA:  Peripherally inserted central catheter. Central line placement.  EXAM: PORTABLE CHEST - 1 VIEW  COMPARISON:  11/20/2014.  FINDINGS: Placement of a split dialysis catheter via LEFT IJ approach. Distal  tip terminates in the RIGHT atrium. RIGHT IJ central line is unchanged. Persistent volume overload with borderline heart size, bilateral effusions and basilar predominant airspace disease compatible with edema.  IMPRESSION: Placement of LEFT IJ PermCath with other support apparatus stable. Unchanged volume overload/ CHF.   Electronically Signed   By: Dereck Ligas M.D.   On: 11/30/2014 14:09   Dg Fluoro Guide Cv Line-no Report  11/30/2014   CLINICAL DATA:  FLOURO GUIDE CV LINE  Fluoroscopy was utilized by the requesting physician.  No radiographic  interpretation.     Microbiology: Recent Results (from the past 240 hour(s))  MRSA PCR Screening     Status: None   Collection Time: 12/19/14  1:32 PM  Result Value Ref Range Status   MRSA by PCR NEGATIVE NEGATIVE Final    Comment:        The GeneXpert MRSA Assay (FDA approved for NASAL specimens only), is one component of a comprehensive MRSA colonization surveillance program. It is not intended to diagnose MRSA infection nor to guide or monitor treatment for MRSA infections.      Labs: Basic Metabolic Panel:  Recent Labs Lab 12/18/14 1918 12/19/14 0501 12/19/14 0750 12/20/14 0413 12/22/14 0430  NA 141  --  140 138 136  K 3.0*  --  3.6 4.5 3.9  CL 102  --  101 101 101  CO2 27  --  24 23 22   GLUCOSE 166*  --  156* 188* 211*  BUN 24*  --  31* 43* 45*  CREATININE 3.54*  --  4.42* 5.49* 5.06*  CALCIUM 8.3*  --  8.4 8.1* 8.6  MG  --  2.1  --   --   --    Liver Function Tests: No results for input(s): AST, ALT, ALKPHOS, BILITOT, PROT, ALBUMIN in the last 168 hours. No results for input(s): LIPASE, AMYLASE in the last 168 hours. No results for input(s): AMMONIA in the last 168 hours. CBC:  Recent Labs Lab 12/18/14 1918 12/19/14 0750 12/20/14 1311 12/21/14 0412 12/22/14 0430  WBC 9.5 10.4 8.8 9.7 9.1  NEUTROABS 4.8  --   --   --   --   HGB 11.2* 10.6* 10.3* 10.6* 10.4*  HCT 37.6 36.3 35.1* 36.1 35.3*  MCV 98.2  98.9 97.2 99.4 97.0  PLT 159 211 182 177 197   Cardiac Enzymes:  Recent Labs Lab 12/18/14 1918 12/19/14 0501 12/19/14 0950 12/19/14 1605  TROPONINI 0.07* 0.09* 0.09* 0.08*   BNP: BNP (last 3 results)  Recent Labs  11/03/14 0500 11/08/14 0714 11/13/14 0500  PROBNP 1032.0* 1198.0* 4493.0*   CBG:  Recent Labs Lab 12/21/14 0729 12/21/14 1157 12/21/14 1652 12/21/14 2126 12/22/14 0734  GLUCAP 184* 253* 193* 178* 192*       Signed:  Dejana Pugsley  [facility] 12/22/2014, 10:33 AM

## 2014-12-22 NOTE — Progress Notes (Signed)
OK per Dr. Eliseo Squires for d/c today back to [facility] for continued rehab.  Family had requested transport via [place] and this was being worked on by patient's son yesterday.  However, multiple calls to [employer] today were not returned and they appear to be closed for the [employer] holiday.  CSW was able to sit up in a recliner chair for over 2 hours today which was required per Dr. [name] before he would agree to her d/c today as she is a current HD patient. CSW discussed with Jolyne Loa, Admissions Coordinator at [facility].  Patient was placed in a recliner chair with use of a hoyer lift type device to her dialysis treatments and thus is baseline.  Arrangements have been completed for her to go to her dialysis treatment tomorrow per Ms. Lowa. Nursing notified to call report. Above discussed in detail with patient and her son [name] and they are agreeable to d/c plan.  No further CSW needs identified. CSW signing off.  Lorie Phenix. Pauline Good, [place]

## 2014-12-22 NOTE — Progress Notes (Signed)
[facility] Progress Note   Subjective: No complaints, SW says that her SNF has a bed for her to return today.  She hasn't been up in a chair yet however. No further bloody stool, heparin stopped and coumadin stopped  Filed Vitals:   12/22/14 0736 12/22/14 1026 12/22/14 1031 12/22/14 1133  BP: 145/52 155/44  143/57  Pulse: 76  80 73  Temp: 98.8 F (37.1 C)   97.8 F (36.6 C)  TempSrc: Oral   Oral  Resp: 17   19  Height:      Weight:      SpO2: 97%   100%   Exam: Alert, no distress, frail elderly female, obese No jvd Chest clear bilat RRR 2/6 SEM, no rub or gallop Abd soft, NTND, no mass Trace LE edema bilat Neuro moderate gen weakness, nonfocal, alert and responsive, frail L IJ TDC, LFA AVF +bruit  HD: MWF [place] 4h   2/2.25 [place]   100.5kg   Heparin 3300  L iJ TDC (maturing LFA AVF from 11/30/14) Aranesp 150/wk,  Venofer/Vit D none Lab: Hb 10.8, no pth data yet        Assessment: 1. Chest pain - Myoview negative, no further w/u at this time per cards 2. Afib - had GIB on heparin so is now on ASA only, also po amio 3. ESRD on HD MWF - no HD today d/t holiday schedule, she will dialyze on Sat 4. Anemia - no Hb drop w bloody stool yest, stable 10.4, cont aranesp 5. HTN/volume - midodrine stopped d/t high BP's, got vol down to 96kg but still has some extra volume; on po MTP and hydralazine 6. MBD cont phoslo 7. IDDM per primary 8. Depression - Zoloft stopped per fam request 9. Debility - reportedly was requiring Hoyer lift to get her to dialysis from SNF 10. Dispo - needs to be able to sit in a chair for HD, plan for a trial today to sit in chair and if she can than ok to d/c , have dw SW 11. DNR - prognosis very poor overall and have made family aware, have d/w patient also who has limited understanding but we did discuss it anyways  Plan- Up in chair, if can sit up for 2-3 hours can be dc'd to SNF.  If not, have written HD orders for Sat am just in  case.      Kelly Splinter MD  pager [phone]    cell [phone]  12/22/2014, 11:36 AM     Recent Labs Lab 12/19/14 0750 12/20/14 0413 12/22/14 0430  NA 140 138 136  K 3.6 4.5 3.9  CL 101 101 101  CO2 24 23 22   GLUCOSE 156* 188* 211*  BUN 31* 43* 45*  CREATININE 4.42* 5.49* 5.06*  CALCIUM 8.4 8.1* 8.6   No results for input(s): AST, ALT, ALKPHOS, BILITOT, PROT, ALBUMIN in the last 168 hours.  Recent Labs Lab 12/18/14 1918  12/20/14 1311 12/21/14 0412 12/22/14 0430  WBC 9.5  < > 8.8 9.7 9.1  NEUTROABS 4.8  --   --   --   --   HGB 11.2*  < > 10.3* 10.6* 10.4*  HCT 37.6  < > 35.1* 36.1 35.3*  MCV 98.2  < > 97.2 99.4 97.0  PLT 159  < > 182 177 197  < > = values in this interval not displayed. Marland Kitchen amiodarone  200 mg Oral BID  . aspirin  324 mg Oral Daily  .  atorvastatin  80 mg Oral q1800  . calcium acetate  667 mg Oral TID WC  . darbepoetin (ARANESP) injection - DIALYSIS  150 mcg Intravenous Q Wed-HD  . dorzolamide-timolol  1 drop Left Eye BID  . feeding supplement (NEPRO CARB STEADY)  237 mL Oral Q24H  . feeding supplement (PRO-STAT SUGAR FREE 64)  30 mL Oral TID WC  . folic acid  1 mg Oral Daily  . hydrALAZINE  25 mg Oral BID  . insulin aspart  0-5 Units Subcutaneous QHS  . insulin aspart  0-9 Units Subcutaneous TID WC  . insulin glargine  15 Units Subcutaneous QHS  . loratadine  10 mg Oral Daily  . megestrol  40 mg Oral Daily  . Melatonin  6 mg Oral QHS  . metoprolol  50 mg Oral BID  . multivitamin  1 tablet Oral QHS  . pantoprazole  40 mg Oral Daily  . senna-docusate  1 tablet Oral Daily  . sodium chloride  3 mL Intravenous Q12H     diazepam, morphine injection, nitroGLYCERIN, ondansetron, oxyCODONE, polyethylene glycol, promethazine

## 2014-12-22 NOTE — Progress Notes (Signed)
D/c held due to renal MD wanting to dialyze patient in chair on Saturday to be sure she can tolerate.  D/C summary done.  Patient to get up to chair today. Eulogio Bear DO

## 2014-12-22 NOTE — Progress Notes (Signed)
[name] James, CSW spoke with Dr. [name] regarding pt's discharge disposition. He stated if pt is able to tolerate sitting in a chair for 2 hours today, she is ok to discharge back to SNF today. Pt OOB to chair via lift at 1115 and tolerating well. Dr. Eliseo Squires paged and updated. Spoke with Dr. [name] regarding HD for tomorrow as today is her regularly scheduled dialysis day, but with today being a holiday, pt unable to go for treatment. Dr. [name] stated pt should be on schedule for tomorrow at OP dialysis center and should be fine to show up as there is no way to call and confirm this today. CSW updated as well.

## 2014-12-23 DIAGNOSIS — D62 Acute posthemorrhagic anemia: Secondary | ICD-10-CM | POA: Diagnosis not present

## 2014-12-23 DIAGNOSIS — E1122 Type 2 diabetes mellitus with diabetic chronic kidney disease: Secondary | ICD-10-CM | POA: Diagnosis not present

## 2014-12-23 DIAGNOSIS — N186 End stage renal disease: Secondary | ICD-10-CM | POA: Diagnosis not present

## 2014-12-23 DIAGNOSIS — D631 Anemia in chronic kidney disease: Secondary | ICD-10-CM | POA: Diagnosis not present

## 2014-12-24 DIAGNOSIS — N186 End stage renal disease: Secondary | ICD-10-CM | POA: Diagnosis not present

## 2014-12-24 DIAGNOSIS — E1122 Type 2 diabetes mellitus with diabetic chronic kidney disease: Secondary | ICD-10-CM | POA: Diagnosis not present

## 2014-12-24 DIAGNOSIS — D62 Acute posthemorrhagic anemia: Secondary | ICD-10-CM | POA: Diagnosis not present

## 2014-12-24 DIAGNOSIS — D631 Anemia in chronic kidney disease: Secondary | ICD-10-CM | POA: Diagnosis not present

## 2014-12-25 ENCOUNTER — Other Ambulatory Visit: Payer: Self-pay | Admitting: *Deleted

## 2014-12-25 DIAGNOSIS — D62 Acute posthemorrhagic anemia: Secondary | ICD-10-CM | POA: Diagnosis not present

## 2014-12-25 DIAGNOSIS — D631 Anemia in chronic kidney disease: Secondary | ICD-10-CM | POA: Diagnosis not present

## 2014-12-25 DIAGNOSIS — E1122 Type 2 diabetes mellitus with diabetic chronic kidney disease: Secondary | ICD-10-CM | POA: Diagnosis not present

## 2014-12-25 DIAGNOSIS — N186 End stage renal disease: Secondary | ICD-10-CM | POA: Diagnosis not present

## 2014-12-25 MED ORDER — DIAZEPAM 5 MG PO TABS: ORAL_TABLET | ORAL | Status: DC

## 2014-12-25 MED ORDER — OXYCODONE HCL 5 MG PO TABS: 2.5000 mg | ORAL_TABLET | ORAL | Status: DC | PRN

## 2014-12-25 NOTE — Telephone Encounter (Signed)
[facility] 

## 2014-12-26 ENCOUNTER — Non-Acute Institutional Stay (SKILLED_NURSING_FACILITY): Payer: Medicare Other | Admitting: Adult Health

## 2014-12-26 ENCOUNTER — Encounter: Payer: Self-pay | Admitting: Adult Health

## 2014-12-26 DIAGNOSIS — G4700 Insomnia, unspecified: Secondary | ICD-10-CM | POA: Diagnosis not present

## 2014-12-26 DIAGNOSIS — Z992 Dependence on renal dialysis: Secondary | ICD-10-CM

## 2014-12-26 DIAGNOSIS — R5381 Other malaise: Secondary | ICD-10-CM

## 2014-12-26 DIAGNOSIS — N186 End stage renal disease: Secondary | ICD-10-CM | POA: Diagnosis not present

## 2014-12-26 DIAGNOSIS — D638 Anemia in other chronic diseases classified elsewhere: Secondary | ICD-10-CM

## 2014-12-26 DIAGNOSIS — I150 Renovascular hypertension: Secondary | ICD-10-CM

## 2014-12-26 DIAGNOSIS — K2990 Gastroduodenitis, unspecified, without bleeding: Secondary | ICD-10-CM | POA: Diagnosis not present

## 2014-12-26 DIAGNOSIS — E1165 Type 2 diabetes mellitus with hyperglycemia: Secondary | ICD-10-CM

## 2014-12-26 DIAGNOSIS — E1129 Type 2 diabetes mellitus with other diabetic kidney complication: Secondary | ICD-10-CM | POA: Diagnosis not present

## 2014-12-26 DIAGNOSIS — K2970 Gastritis, unspecified, without bleeding: Secondary | ICD-10-CM

## 2014-12-26 DIAGNOSIS — IMO0002 Reserved for concepts with insufficient information to code with codable children: Secondary | ICD-10-CM

## 2014-12-26 DIAGNOSIS — I953 Hypotension of hemodialysis: Secondary | ICD-10-CM | POA: Diagnosis not present

## 2014-12-26 DIAGNOSIS — E46 Unspecified protein-calorie malnutrition: Secondary | ICD-10-CM | POA: Diagnosis not present

## 2014-12-26 DIAGNOSIS — I5032 Chronic diastolic (congestive) heart failure: Secondary | ICD-10-CM | POA: Diagnosis not present

## 2014-12-26 DIAGNOSIS — F411 Generalized anxiety disorder: Secondary | ICD-10-CM | POA: Diagnosis not present

## 2014-12-26 NOTE — Progress Notes (Signed)
Patient ID: [name] James, female   DOB: 1936-11-24, 79 y.o.   MRN: [id number]   12/26/2014  Facility:  [facility]ing Home Location:  [place] Room Number: 102-P LEVEL OF CARE:  SNF (31)   Chief Complaint  Patient presents with  . Hospitalization Follow-up    Physical deconditioning, Atrial fibrillation, ESRD, hyperlipidemia, anemia, diabetes mellitus, insomnia, hypertension, hypotension, anxiety, protein calorie malnutrition, CHF and gastritis    HISTORY OF PRESENT ILLNESS:  This is a 79 year old female who was recently discharge from the hospital and recently started on hemodialysis. While she was having dialysis she started having palpitations, irregular heartbeat (heart rate 160) and chest pains. Workup in ED showed elevated troponin and EKG showed nonspecific T-wave change in lead II and aVF. Medical history is significant for diabetes mellitus, anemia, diastolic CHF and hypertension. Today, she is seen while in bed. She is verbally responsive, alert and oriented. She verbalized having weakness on bilateral lower extremity but she is able to move them. She has been admitted for a short-term rehabilitation.  PAST MEDICAL HISTORY:  Past Medical History  Diagnosis Date  . CHF (congestive heart failure) 05/13/2011    echo - [id number]; stage 1 diastolic dysfunction; elevated LV filling pressure, MAC  . Chest pain, atypical 12/05/2008    R/Lmv- normal perfusion all regions, noe ECG changes   . Renal insufficiency 10/30/2005    doppler - abn resistance consistent w/ parenchymal disease  . Claudication 10/30/2005    doppler - normal evaluation, no evidence of aneurysm, diameter reduction, dissection, compression or vascular abnormality)  . DM (diabetes mellitus)   . Peripheral neuropathy   . Hypertension   . Edema   . Gout   . Anxiety   . Arthritis   . Cataract   . GERD (gastroesophageal reflux disease)   . Breast cancer 1980s    bil mastectomies, no radiation  or chemo  . Complication of anesthesia   . PONV (postoperative nausea and vomiting)   . Family history of adverse reaction to anesthesia     son also has nausea     CURRENT MEDICATIONS: Reviewed per MAR/see medication list  Allergies  Allergen Reactions  . Erythromycin Swelling  . Penicillins     She has taken keflex many times without porblems  . Codeine Rash   REVIEW OF SYSTEMS:  GENERAL: no fatigue, no weight changes, no fever, chills  RESPIRATORY: no cough, SOB, DOE, wheezing, hemoptysis CARDIAC: no chest pain GI: no abdominal pain, diarrhea, constipation, heart burn, nausea or vomiting  PHYSICAL EXAMINATION  GENERAL: no acute distress, obese EYES: conjunctivae normal, sclerae normal, normal eye lids NECK: supple, trachea midline, no neck masses, no thyroid tenderness, no thyromegaly LYMPHATICS: no LAN in the neck, no supraclavicular LAN RESPIRATORY: breathing is even & unlabored, BS CTAB CARDIAC: RRR, no murmur,no extra heart sounds, BLE edema 2+ GI: abdomen soft, normal BS, no masses, no tenderness, no hepatomegaly, no splenomegaly EXTREMITIES: Able to move all 4 extremities; Left wrist AV fistula + for bruit and thrill, Left chest temporary catheter for dialysis PSYCHIATRIC: the patient is alert & oriented to person, affect & behavior appropriate  LABS/RADIOLOGY: Labs reviewed: Basic Metabolic Panel:  Recent Labs  11/13/14 0500  11/20/14 0500  12/01/14 1259 12/04/14 0500 12/06/14 0500  12/19/14 0501 12/19/14 0750 12/20/14 0413 12/22/14 0430  NA 142  < > 136*  < > 136* 130* 131*  < >  --  140 138 136  K 4.5  < >  3.7  < > 4.6 4.3 3.9  < >  --  3.6 4.5 3.9  CL 101  < > 97  < > 98 93* 92*  < >  --  101 101 101  CO2 21  < > 27  < > 25 24 24   < >  --  24 23 22   GLUCOSE 215*  < > 162*  < > 199* 122* 118*  < >  --  156* 188* 211*  BUN 131*  < > 26*  < > 36* 53* 41*  < >  --  31* 43* 45*  CREATININE 3.73*  < > 2.94*  < > 3.65* 5.13* 4.19*  < >  --  4.42* 5.49*  5.06*  CALCIUM 8.6  8.3*  < > 8.3*  < > 8.7 8.9 8.7  < >  --  8.4 8.1* 8.6  MG 2.3  --  1.9  --   --   --   --   --  2.1  --   --   --   PHOS 5.6*  < > 4.8*  < > 2.7 1.7* 1.9*  --   --   --   --   --   < > = values in this interval not displayed. Liver Function Tests:  Recent Labs  11/06/14 0755  11/08/14 0714  11/13/14 0500  12/01/14 1259 12/04/14 0500 12/06/14 0500  AST 85*  --  45*  --  47*  --   --   --   --   ALT 65*  --  47*  --  74*  --   --   --   --   ALKPHOS 81  --  84  --  102  --   --   --   --   BILITOT <0.2*  --  <0.2*  --  0.3  --   --   --   --   PROT 6.0  --  5.8*  --  5.8*  --   --   --   --   ALBUMIN 2.4*  < > 3.1*  3.2*  < > 3.0*  < > 2.7* 2.8* 2.7*  < > = values in this interval not displayed.  CBC:  Recent Labs  11/13/14 0500  11/20/14 0500  12/18/14 1918  12/20/14 1311 12/21/14 0412 12/22/14 0430  WBC 9.3  < > 8.6  < > 9.5  < > 8.8 9.7 9.1  NEUTROABS 7.0  --  5.7  --  4.8  --   --   --   --   HGB 8.3*  < > 9.1*  < > 11.2*  < > 10.3* 10.6* 10.4*  HCT 26.2*  < > 29.9*  < > 37.6  < > 35.1* 36.1 35.3*  MCV 98.5  < > 101.4*  < > 98.2  < > 97.2 99.4 97.0  PLT 158  < > 177  < > 159  < > 182 177 197  < > = values in this interval not displayed.  Cardiac Enzymes:  Recent Labs  11/25/14 0610  12/19/14 0501 12/19/14 0950 12/19/14 1605  CKTOTAL 11  --   --   --   --   TROPONINI  --   < > 0.09* 0.09* 0.08*  < > = values in this interval not displayed.  CBG:  Recent Labs  12/21/14 2126 12/22/14 0734 12/22/14 1131  GLUCAP 178* 192* 226*   Nm Myocar Multi  W/spect W/wall Motion / Ef  12/20/2014   CLINICAL DATA:  Chest pain. CHF. Renal insufficiency. Myocardial ischemia. Abnormal cardiac enzymes.  EXAM: MYOCARDIAL IMAGING WITH SPECT (REST AND PHARMACOLOGIC-STRESS)  GATED LEFT VENTRICULAR WALL MOTION STUDY  LEFT VENTRICULAR EJECTION FRACTION  TECHNIQUE: Standard myocardial SPECT imaging was performed after resting intravenous injection of 10 mCi  Tc-65m sestamibi. Subsequently, intravenous infusion of Lexiscan was performed under the supervision of the Cardiology staff. At peak effect of the drug, 30 mCi Tc-31m sestamibi was injected intravenously and standard myocardial SPECT imaging was performed. Quantitative gated imaging was also performed to evaluate left ventricular wall motion, and estimate left ventricular ejection fraction.  COMPARISON:  12/18/2014 chest radiograph.  FINDINGS: Perfusion: Small area of fixed decreased perfusion is present in the mid inferior lateral wall. No reversible defects. No peri-infarct ischemia.  Wall Motion: Normal.  Left Ventricular Ejection Fraction: 68 %  End diastolic volume 59 ml  End systolic volume 19 ml  IMPRESSION: 1. No reversible ischemia.  Small mid inferolateral wall infarct.  2. Normal left ventricular wall motion.  3. Left ventricular ejection fraction 68%  4. Low-risk stress test findings*.  *2012 Appropriate Use Criteria for Coronary Revascularization Focused Update: J Am Coll Cardiol. N6492421. http://content.airportbarriers.com.aspx?articleid=1201161   Electronically Signed   By: Dereck Ligas M.D.   On: 12/20/2014 12:57   Dg Chest Port 1 View  12/18/2014   CLINICAL DATA:  Irregular heart rate at dialysis today, hand tremor. History CHF, renal insufficiency, breast cancer.  EXAM: PORTABLE CHEST - 1 VIEW  COMPARISON:  Chest radiograph November 30, 2014  FINDINGS: Cardiac silhouette is unremarkable and unchanged. Mildly calcified aortic knob. Hazy density in the LEFT lung base. Tunneled dialysis catheter via LEFT internal jugular venous approach with distal tips in the cavoatrial junction, unchanged. No pneumothorax. Soft tissue planes and included osseous structures are nonsuspicious. Interval removal of RIGHT internal jugular intravenous line.  IMPRESSION: Hazy density in LEFT lung base may reflect small layering pleural effusion, no frank pulmonary edema on today's examination.    Electronically Signed   By: Elon Alas   On: 12/18/2014 19:59   Dg Chest Port 1 View  11/30/2014   CLINICAL DATA:  Peripherally inserted central catheter. Central line placement.  EXAM: PORTABLE CHEST - 1 VIEW  COMPARISON:  11/20/2014.  FINDINGS: Placement of a split dialysis catheter via LEFT IJ approach. Distal tip terminates in the RIGHT atrium. RIGHT IJ central line is unchanged. Persistent volume overload with borderline heart size, bilateral effusions and basilar predominant airspace disease compatible with edema.  IMPRESSION: Placement of LEFT IJ PermCath with other support apparatus stable. Unchanged volume overload/ CHF.   Electronically Signed   By: Dereck Ligas M.D.   On: 11/30/2014 14:09   Dg Fluoro Guide Cv Line-no Report  11/30/2014   CLINICAL DATA:    FLOURO GUIDE CV LINE  Fluoroscopy was utilized by the requesting physician.  No radiographic  interpretation.     ASSESSMENT/PLAN:  Physical deconditioning - for rehabilitation/strengthening Atrial fibrillation - rate controlled; continue amiodarone 200 mg by mouth twice a day 4 weeks then 200 mg by mouth daily and Lopressor 50 mg by mouth twice a day; aspirin 81 mg take 4 tabs = 324 mg by mouth daily, not a candidate for Coumadin due to melanotic stool ESRD - on hemodialysis Mondays, Wednesdays and Fridays; continue PhosLo and fluid restriction 1800 mL/24 hour Hyperlipidemia - continue Lipitor 80 mg 1 tab by mouth every 6 p.m. Anemia of chronic disease -  hemoglobin 10.4, stable; continue Aranesp 150 g/0.3 mL IV every Wednesdays during hemodialysis Diabetes mellitus with renal complication - hemoglobin A1c 6.1; continue Levemir 20 units subcutaneous daily at bedtime and NovoLog sliding scale 3 times a day with meals Diastolic CHF - stable Insomnia - continue melatonin 6 mg by mouth daily at bedtime Renovascular hypertension - well controlled; continue Lopressor 50 mg by mouth twice a day and hydralazine 75 mg by mouth  every 8 hours Hemodialysis associated hypotension - continue midodrine 10 mg by mouth every Mondays, Wednesdays and Fridays (dialysis days) Anxiety - mood is stable; continue volume 2.5 mg by mouth daily at bedtime Protein calorie malnutrition, severe - albumin 3.0; continue protein supplementation Gastritis - continue Protonix 40 mg by mouth daily; for GI follow-up; EGD done on 10/24/14, showed esophagitis, gastritis and gastric polyps   Goals of care:  Short-term rehabilitation   Labs/test ordered:   none   Spent 50 minutes in patient care.     [facility], NP [employer] [phone]

## 2014-12-27 DIAGNOSIS — D62 Acute posthemorrhagic anemia: Secondary | ICD-10-CM | POA: Diagnosis not present

## 2014-12-27 DIAGNOSIS — N186 End stage renal disease: Secondary | ICD-10-CM | POA: Diagnosis not present

## 2014-12-27 DIAGNOSIS — D631 Anemia in chronic kidney disease: Secondary | ICD-10-CM | POA: Diagnosis not present

## 2014-12-27 DIAGNOSIS — E1122 Type 2 diabetes mellitus with diabetic chronic kidney disease: Secondary | ICD-10-CM | POA: Diagnosis not present

## 2014-12-28 ENCOUNTER — Non-Acute Institutional Stay (SKILLED_NURSING_FACILITY): Payer: Medicare Other | Admitting: Internal Medicine

## 2014-12-28 DIAGNOSIS — K2970 Gastritis, unspecified, without bleeding: Secondary | ICD-10-CM

## 2014-12-28 DIAGNOSIS — G4700 Insomnia, unspecified: Secondary | ICD-10-CM | POA: Diagnosis not present

## 2014-12-28 DIAGNOSIS — Z992 Dependence on renal dialysis: Secondary | ICD-10-CM

## 2014-12-28 DIAGNOSIS — D638 Anemia in other chronic diseases classified elsewhere: Secondary | ICD-10-CM

## 2014-12-28 DIAGNOSIS — E1129 Type 2 diabetes mellitus with other diabetic kidney complication: Secondary | ICD-10-CM | POA: Diagnosis not present

## 2014-12-28 DIAGNOSIS — I480 Paroxysmal atrial fibrillation: Secondary | ICD-10-CM

## 2014-12-28 DIAGNOSIS — R5381 Other malaise: Secondary | ICD-10-CM | POA: Diagnosis not present

## 2014-12-28 DIAGNOSIS — E46 Unspecified protein-calorie malnutrition: Secondary | ICD-10-CM | POA: Diagnosis not present

## 2014-12-28 DIAGNOSIS — I5042 Chronic combined systolic (congestive) and diastolic (congestive) heart failure: Secondary | ICD-10-CM

## 2014-12-28 DIAGNOSIS — IMO0002 Reserved for concepts with insufficient information to code with codable children: Secondary | ICD-10-CM

## 2014-12-28 DIAGNOSIS — I953 Hypotension of hemodialysis: Secondary | ICD-10-CM | POA: Diagnosis not present

## 2014-12-28 DIAGNOSIS — E1165 Type 2 diabetes mellitus with hyperglycemia: Secondary | ICD-10-CM

## 2014-12-28 DIAGNOSIS — K2990 Gastroduodenitis, unspecified, without bleeding: Secondary | ICD-10-CM | POA: Diagnosis not present

## 2014-12-28 DIAGNOSIS — I10 Essential (primary) hypertension: Secondary | ICD-10-CM | POA: Diagnosis not present

## 2014-12-28 DIAGNOSIS — N186 End stage renal disease: Secondary | ICD-10-CM | POA: Diagnosis not present

## 2014-12-28 NOTE — Progress Notes (Signed)
Patient ID: [name] James, female   DOB: 26-Jul-1936, 79 y.o.   MRN: [id number]     [facility]   PCP: Shirline Frees, MD  Code Status: full code  Allergies  Allergen Reactions  . Erythromycin Swelling  . Penicillins     She has taken keflex many times without porblems  . Codeine Rash    Chief Complaint  Patient presents with  . New Admit To SNF     HPI:  79 y/o year old patient is here for short term rehabilitation post hospital admission from 12/18/14-12/22/14 with atrial fibrillation and chest pain. Stress test was negative for acute ischemic event. She was started on amiodarone. She was in the hospital prior to this with chf exacerbation, blood loss anemia from gastritis and esophagitis and e.coli UTI. She was getting her dialysis when she developed palpitation and chest pain requiring her to be admitted to the hospital. She has pmh of chf, ckd, HTN, DM, anemia and HLD among others She is seen in her room today. She complaints of being nauseous and having heartburn. She feels weak and tired.    Review of Systems:  Constitutional: Negative for fever, chills, diaphoresis.  HENT: Negative for congestion Eyes: Negative for eye pain, blurred vision, double vision and discharge.  Respiratory: Negative for shortness of breath and wheezing.  on o2 and has some cough Cardiovascular: Negative for chest pain, palpitations.  Gastrointestinal: Negative for vomiting, abdominal pain. Appetite is fair Genitourinary: she makes some urine Musculoskeletal: Negative for back pain, falls. On wheelchair Skin: Negative for itching, rash.  Neurological: Negative for dizziness, tingling, focal weakness and headaches.  Psychiatric/Behavioral:positive for depression   Past Medical History  Diagnosis Date  . CHF (congestive heart failure) 05/13/2011    echo - [id number]; stage 1 diastolic dysfunction; elevated LV filling pressure, MAC  . Chest pain, atypical  12/05/2008    R/Lmv- normal perfusion all regions, noe ECG changes   . Renal insufficiency 10/30/2005    doppler - abn resistance consistent w/ parenchymal disease  . Claudication 10/30/2005    doppler - normal evaluation, no evidence of aneurysm, diameter reduction, dissection, compression or vascular abnormality)  . DM (diabetes mellitus)   . Peripheral neuropathy   . Hypertension   . Edema   . Gout   . Anxiety   . Arthritis   . Cataract   . GERD (gastroesophageal reflux disease)   . Breast cancer 1980s    bil mastectomies, no radiation or chemo  . Complication of anesthesia   . PONV (postoperative nausea and vomiting)   . Family history of adverse reaction to anesthesia     son also has nausea    Past Surgical History  Procedure Laterality Date  . Mastectomy Bilateral ~1982    bil breast reconstruction with implants  . Back surgery  ~1980  . Hemorrhoid surgery    . Cholecystectomy    . Eye surgery    . Orif tibia & fibula fractures Left 2007    also had left non displaced malleolar fracture.   . Esophagogastroduodenoscopy N/A 10/24/2014    Procedure: ESOPHAGOGASTRODUODENOSCOPY (EGD);  Surgeon: Jerene Bears, MD;  Location: [facility] ENDOSCOPY;  Service: Endoscopy;  Laterality: N/A;  . Av fistula placement Left 11/30/2014    Procedure: ARTERIOVENOUS (AV) FISTULA CREATION LEFT ARM;  Surgeon: Angelia Mould, MD;  Location: [place];  Service: Vascular;  Laterality: Left;  . Exchange of a dialysis catheter Left 11/30/2014  Procedure: EXCHANGE OF A DIALYSIS CATHETER, LEFT INTERNAL JUGULAR;  Surgeon: Angelia Mould, MD;  Location: [place];  Service: Vascular;  Laterality: Left;   Social History:   reports that she has never smoked. She has never used smokeless tobacco. She reports that she does not drink alcohol or use illicit drugs.  Family History  Problem Relation Age of Onset  . Cancer Mother   . Diabetes Mother   . Heart disease Mother   . Heart attack Father   .  Stroke Father   . Parkinson's disease Brother   . Cancer Sister     Medications: Patient's Medications  New Prescriptions   No medications on file  Previous Medications   AMINO ACIDS-PROTEIN HYDROLYS (FEEDING SUPPLEMENT, PRO-STAT SUGAR FREE 64,) LIQD    Take 30 mLs by mouth 3 (three) times daily with meals.   AMIODARONE (PACERONE) 200 MG TABLET    Take 1 tablet (200 mg total) by mouth 2 (two) times daily.   ASPIRIN 81 MG CHEWABLE TABLET    Chew 4 tablets (324 mg total) by mouth daily.   ATORVASTATIN (LIPITOR) 80 MG TABLET    Take 1 tablet (80 mg total) by mouth daily at 6 PM.   B COMPLEX-VITAMIN C-FOLIC ACID (NEPHRO-VITE) 0.8 MG TABS TABLET    Take 1 tablet by mouth at bedtime.   CALCIUM ACETATE (PHOSLO) 667 MG CAPSULE    Take 1 capsule (667 mg total) by mouth 3 (three) times daily with meals.   DARBEPOETIN ALFA (ARANESP) 150 MCG/0.3ML SOSY INJECTION    Inject 0.3 mLs (150 mcg total) into the vein every Wednesday with hemodialysis.   DIAZEPAM (VALIUM) 5 MG TABLET    Take 1/2 tablet by mouth once at bedtime for rest   DORZOLAMIDE-TIMOLOL (COSOPT) 22.3-6.8 MG/ML OPHTHALMIC SOLUTION    Place 1 drop into the left eye 2 (two) times daily.    FOLIC ACID (FOLVITE) 1 MG TABLET    Take 1 mg by mouth daily.   HYDRALAZINE (APRESOLINE) 25 MG TABLET    Take 3 tablets (75 mg total) by mouth every 8 (eight) hours.   INSULIN ASPART (NOVOLOG) 100 UNIT/ML INJECTION    Inject 0-9 Units into the skin 3 (three) times daily before meals. Sliding scale   INSULIN DETEMIR (LEVEMIR) 100 UNIT/ML INJECTION    Inject 20 Units into the skin at bedtime.   LORATADINE (CLARITIN) 10 MG TABLET    Take 10 mg by mouth daily.   MEGESTROL (MEGACE) 40 MG/ML SUSPENSION    Take 40 mg by mouth daily.   MELATONIN 3 MG TABS    Take 6 mg by mouth at bedtime.   METOPROLOL (LOPRESSOR) 50 MG TABLET    Take 50 mg by mouth 2 (two) times daily.   MIDODRINE (PROAMATINE) 10 MG TABLET    Take 10 mg by mouth every Monday, Wednesday, and Friday.    NUTRITIONAL SUPPLEMENTS (FEEDING SUPPLEMENT, NEPRO CARB STEADY,) LIQD    Take 237 mLs by mouth daily.   ONDANSETRON (ZOFRAN) 4 MG/2ML SOLN INJECTION    Inject 2 mLs (4 mg total) into the vein every 6 (six) hours as needed for nausea.   OXYCODONE (OXY IR/ROXICODONE) 5 MG IMMEDIATE RELEASE TABLET    Take 0.5 tablets (2.5 mg total) by mouth every 4 (four) hours as needed for severe pain.   PANTOPRAZOLE (PROTONIX) 40 MG TABLET    Take 1 tablet (40 mg total) by mouth daily.   POLYETHYLENE GLYCOL (MIRALAX / GLYCOLAX) PACKET  Take 17 g by mouth daily as needed for mild constipation.   PROMETHAZINE (PHENERGAN) 25 MG TABLET    Take 25 mg by mouth 2 (two) times daily as needed for nausea or vomiting.   SENNOSIDES-DOCUSATE SODIUM (SENOKOT-S) 8.6-50 MG TABLET    Take 1 tablet by mouth daily.  Modified Medications   No medications on file  Discontinued Medications   No medications on file     Physical Exam: Filed Vitals:   12/28/14 1402  BP: 137/63  Pulse: 69  Temp: 98.4 F (36.9 C)  Resp: 18  SpO2: 98%   General- elderly obese female in no acute distress, on o2 Head- atraumatic, normocephalic Eyes- PERRLA, EOMI, no pallor, no icterus, no discharge Neck- no cervical lymphadenopathy Throat- moist mucus membrane Cardiovascular- normal s1,s2, no murmurs, trace edema Respiratory- bilateral clear to auscultation, no wheeze, no rhonchi, no crackles, no use of accessory muscles, on o2 Abdomen- bowel sounds present, soft, non tender, no epigastric tenderness Musculoskeletal- able to move all 4 extremities, generalized weakness Neurological- no focal deficit Skin- warm and dry, left chest dialysis catheter, site clean Psychiatry- alert and oriented to person, place and time, flat affect  Labs reviewed: Basic Metabolic Panel:  Recent Labs  11/13/14 0500  11/20/14 0500  12/01/14 1259 12/04/14 0500 12/06/14 0500  12/19/14 0501 12/19/14 0750 12/20/14 0413 12/22/14 0430  NA 142  < > 136*   < > 136* 130* 131*  < >  --  140 138 136  K 4.5  < > 3.7  < > 4.6 4.3 3.9  < >  --  3.6 4.5 3.9  CL 101  < > 97  < > 98 93* 92*  < >  --  101 101 101  CO2 21  < > 27  < > 25 24 24   < >  --  24 23 22   GLUCOSE 215*  < > 162*  < > 199* 122* 118*  < >  --  156* 188* 211*  BUN 131*  < > 26*  < > 36* 53* 41*  < >  --  31* 43* 45*  CREATININE 3.73*  < > 2.94*  < > 3.65* 5.13* 4.19*  < >  --  4.42* 5.49* 5.06*  CALCIUM 8.6  8.3*  < > 8.3*  < > 8.7 8.9 8.7  < >  --  8.4 8.1* 8.6  MG 2.3  --  1.9  --   --   --   --   --  2.1  --   --   --   PHOS 5.6*  < > 4.8*  < > 2.7 1.7* 1.9*  --   --   --   --   --   < > = values in this interval not displayed. Liver Function Tests:  Recent Labs  11/06/14 0755  11/08/14 0714  11/13/14 0500  12/01/14 1259 12/04/14 0500 12/06/14 0500  AST 85*  --  45*  --  47*  --   --   --   --   ALT 65*  --  47*  --  74*  --   --   --   --   ALKPHOS 81  --  84  --  102  --   --   --   --   BILITOT <0.2*  --  <0.2*  --  0.3  --   --   --   --   PROT 6.0  --  5.8*  --  5.8*  --   --   --   --   ALBUMIN 2.4*  < > 3.1*  3.2*  < > 3.0*  < > 2.7* 2.8* 2.7*  < > = values in this interval not displayed. No results for input(s): LIPASE, AMYLASE in the last 8760 hours. No results for input(s): AMMONIA in the last 8760 hours. CBC:  Recent Labs  11/13/14 0500  11/20/14 0500  12/18/14 1918  12/20/14 1311 12/21/14 0412 12/22/14 0430  WBC 9.3  < > 8.6  < > 9.5  < > 8.8 9.7 9.1  NEUTROABS 7.0  --  5.7  --  4.8  --   --   --   --   HGB 8.3*  < > 9.1*  < > 11.2*  < > 10.3* 10.6* 10.4*  HCT 26.2*  < > 29.9*  < > 37.6  < > 35.1* 36.1 35.3*  MCV 98.5  < > 101.4*  < > 98.2  < > 97.2 99.4 97.0  PLT 158  < > 177  < > 159  < > 182 177 197  < > = values in this interval not displayed. Cardiac Enzymes:  Recent Labs  11/25/14 0610  12/19/14 0501 12/19/14 0950 12/19/14 1605  CKTOTAL 11  --   --   --   --   TROPONINI  --   < > 0.09* 0.09* 0.08*  < > = values in this interval  not displayed. BNP: Invalid input(s): POCBNP CBG:  Recent Labs  12/21/14 2126 12/22/14 0734 12/22/14 1131  GLUCAP 178* 192* 226*    Assessment/Plan  Physical deconditioning Will have her work with physical therapy and occupational therapy team to help with gait training and muscle strengthening exercises.fall precautions. Skin care. Encourage to be out of bed.   afib Rate currently controlled. Continue amiodarone 200 mg bid for total of 4 weeks, then 200 mg daily. Continue lopressor 50 mg bid and aspirin 324 mg daily. Not on anticoagulation with hx of gastritis and melena. Monitor cbc. Change asa 81 mg 4 tab to 325 mg EC with hx of gi bleed. Continue statin  CHF Currently stable on o2, b blocker. Monitor weight, continue HD  ESRD On hemodialysis at present, tolerating it well. S/P radiocephalic AV fistula, not currently in use. Currently has left internal jugular temporary catheter for dialysis. On renal diet. Continue phoslo  Anemia From blood loss from recent GI bleed in setting of gastritis and duodenitis and her CKD. She is S/p transfusion, monitor h&h. Continue iron supplement and aransep. Pending colonoscopy post rehab.   Diabetes mellitus, type II   continue Levemir 20 units bid and NovoLog 5 units 3 times a day with meals, monitor cbg  Protein calorie malnutrition   continue protein supplementation and megace  HD related hypotension Continue midodrine 10 mg on days of dialysis  Gastritis and duodenitis With her heartburn, change protonix to 40 mg twice daily  HTN on Lopressor 50 mg bid and hydralazine 75 mg tid  Insomnia Continue melatonin at bedtime    Goals of care: short term rehabilitation    Labs/tests ordered: cbc in 1 week  Family/ staff Communication: reviewed care plan with patient and nursing supervisor    [name], MD  [facility] [phone] (Monday-Friday 8 am - 5 pm) [phone] (afterhours)

## 2014-12-29 DIAGNOSIS — D62 Acute posthemorrhagic anemia: Secondary | ICD-10-CM | POA: Diagnosis not present

## 2014-12-29 DIAGNOSIS — N186 End stage renal disease: Secondary | ICD-10-CM | POA: Diagnosis not present

## 2014-12-29 DIAGNOSIS — D631 Anemia in chronic kidney disease: Secondary | ICD-10-CM | POA: Diagnosis not present

## 2014-12-29 DIAGNOSIS — E1122 Type 2 diabetes mellitus with diabetic chronic kidney disease: Secondary | ICD-10-CM | POA: Diagnosis not present

## 2015-01-01 DIAGNOSIS — N186 End stage renal disease: Secondary | ICD-10-CM | POA: Diagnosis not present

## 2015-01-01 DIAGNOSIS — D631 Anemia in chronic kidney disease: Secondary | ICD-10-CM | POA: Diagnosis not present

## 2015-01-01 DIAGNOSIS — E1122 Type 2 diabetes mellitus with diabetic chronic kidney disease: Secondary | ICD-10-CM | POA: Diagnosis not present

## 2015-01-01 DIAGNOSIS — D62 Acute posthemorrhagic anemia: Secondary | ICD-10-CM | POA: Diagnosis not present

## 2015-01-03 ENCOUNTER — Other Ambulatory Visit (HOSPITAL_COMMUNITY): Payer: Medicare Other

## 2015-01-03 ENCOUNTER — Encounter: Payer: Self-pay | Admitting: Gastroenterology

## 2015-01-03 DIAGNOSIS — E1122 Type 2 diabetes mellitus with diabetic chronic kidney disease: Secondary | ICD-10-CM | POA: Diagnosis not present

## 2015-01-03 DIAGNOSIS — N186 End stage renal disease: Secondary | ICD-10-CM | POA: Diagnosis not present

## 2015-01-03 DIAGNOSIS — D62 Acute posthemorrhagic anemia: Secondary | ICD-10-CM | POA: Diagnosis not present

## 2015-01-03 DIAGNOSIS — D631 Anemia in chronic kidney disease: Secondary | ICD-10-CM | POA: Diagnosis not present

## 2015-01-03 LAB — CBC AND DIFFERENTIAL
HEMATOCRIT: 41 % (ref 36–46)
Hemoglobin: 12.5 g/dL (ref 12.0–16.0)
NEUTROS ABS: 6 /uL
PLATELETS: 256 10*3/uL (ref 150–399)
WBC: 13.4 10*3/mL

## 2015-01-05 DIAGNOSIS — D62 Acute posthemorrhagic anemia: Secondary | ICD-10-CM | POA: Diagnosis not present

## 2015-01-05 DIAGNOSIS — E1122 Type 2 diabetes mellitus with diabetic chronic kidney disease: Secondary | ICD-10-CM | POA: Diagnosis not present

## 2015-01-05 DIAGNOSIS — D631 Anemia in chronic kidney disease: Secondary | ICD-10-CM | POA: Diagnosis not present

## 2015-01-05 DIAGNOSIS — N186 End stage renal disease: Secondary | ICD-10-CM | POA: Diagnosis not present

## 2015-01-08 DIAGNOSIS — N186 End stage renal disease: Secondary | ICD-10-CM | POA: Diagnosis not present

## 2015-01-08 DIAGNOSIS — D62 Acute posthemorrhagic anemia: Secondary | ICD-10-CM | POA: Diagnosis not present

## 2015-01-08 DIAGNOSIS — E1122 Type 2 diabetes mellitus with diabetic chronic kidney disease: Secondary | ICD-10-CM | POA: Diagnosis not present

## 2015-01-08 DIAGNOSIS — D631 Anemia in chronic kidney disease: Secondary | ICD-10-CM | POA: Diagnosis not present

## 2015-01-09 ENCOUNTER — Encounter: Payer: Self-pay | Admitting: Vascular Surgery

## 2015-01-09 ENCOUNTER — Encounter: Payer: Self-pay | Admitting: Gastroenterology

## 2015-01-09 ENCOUNTER — Ambulatory Visit (INDEPENDENT_AMBULATORY_CARE_PROVIDER_SITE_OTHER): Payer: Medicare Other | Admitting: Gastroenterology

## 2015-01-09 VITALS — BP 106/50 | HR 68 | Ht 64.0 in | Wt 204.0 lb

## 2015-01-09 DIAGNOSIS — D649 Anemia, unspecified: Secondary | ICD-10-CM

## 2015-01-09 DIAGNOSIS — R112 Nausea with vomiting, unspecified: Secondary | ICD-10-CM | POA: Diagnosis not present

## 2015-01-09 DIAGNOSIS — R195 Other fecal abnormalities: Secondary | ICD-10-CM

## 2015-01-09 DIAGNOSIS — R109 Unspecified abdominal pain: Secondary | ICD-10-CM | POA: Diagnosis not present

## 2015-01-09 NOTE — Patient Instructions (Signed)
You have been scheduled for a CT scan of the abdomen and pelvis at [facility] ([address]---this is in the same building as Press [employer]).   You are scheduled on 01/16/15 at 1130 am. You should arrive 15 minutes prior to your appointment time for registration. Please follow the written instructions below on the day of your exam:  WARNING: IF YOU ARE ALLERGIC TO IODINE/X-RAY DYE, PLEASE NOTIFY RADIOLOGY IMMEDIATELY AT [phone]! YOU WILL BE GIVEN A 13 HOUR PREMEDICATION PREP.  1) Do not eat or drink anything after 730 am (4 hours prior to your test) 2) You have been given 2 bottles of oral contrast to drink. The solution may taste better if refrigerated, but do NOT add ice or any other liquid to this solution. Shake well before drinking.    Drink 1 bottle of contrast @ 830 am (2 hours prior to your exam)  Drink 1 bottle of contrast @ 930 am (1 hour prior to your exam)  You may take any medications as prescribed with a small amount of water except for the following: Metformin, Glucophage, Glucovance, Avandamet, Riomet, Fortamet, Actoplus Met, Janumet, Glumetza or Metaglip. The above medications must be held the day of the exam AND 48 hours after the exam.  The purpose of you drinking the oral contrast is to aid in the visualization of your intestinal tract. The contrast solution may cause some diarrhea. Before your exam is started, you will be given a small amount of fluid to drink. Depending on your individual set of symptoms, you may also receive an intravenous injection of x-ray contrast/dye. Plan on being at [facility] for 30 minutes or long, depending on the type of exam you are having performed.  Follow up with Dr Hilarie Fredrickson on 02/09/15 9 am  This test typically takes 30-45 minutes to complete.  If you have any questions regarding your exam or if you need to reschedule, you may call the CT department at [phone] between the hours of 8:00 am and 5:00 pm,  Monday-Friday.  ________________________________________________________________________

## 2015-01-10 ENCOUNTER — Encounter: Payer: Medicare Other | Admitting: Vascular Surgery

## 2015-01-10 DIAGNOSIS — D62 Acute posthemorrhagic anemia: Secondary | ICD-10-CM | POA: Diagnosis not present

## 2015-01-10 DIAGNOSIS — N186 End stage renal disease: Secondary | ICD-10-CM | POA: Diagnosis not present

## 2015-01-10 DIAGNOSIS — E1122 Type 2 diabetes mellitus with diabetic chronic kidney disease: Secondary | ICD-10-CM | POA: Diagnosis not present

## 2015-01-10 DIAGNOSIS — D631 Anemia in chronic kidney disease: Secondary | ICD-10-CM | POA: Diagnosis not present

## 2015-01-11 NOTE — Progress Notes (Signed)
01/09/2015 [name] James [id number] 1936/01/20   History of Present Illness:  This is a 79 year old female with multiple medical problems including deconditioning for which she is currently using a wheelchair, ESRD for which she is on dialysis, HTN, DM on insulin, atrial fibrillation but not on anticoagulation, CHF, HLD, and O2 dependent currently.  She is residing at [facility] at this time.  She presents to our office today with her daughter for hospital follow-up.  She was seen by Dr. Hilarie Fredrickson during one of her recent hospitalizations in November for anemia and black, heme positive stool.  She underwent EGD on 10/24/2014 as listed below.  ENDOSCOPIC IMPRESSION: 1. There was possible short segment Barrett's esophagus found 37 cm from the incisors; multiple biopsies were performed 2. Reflux esophagitis at the gastroesophageal junction 3. Gastritis (inflammation) was found in the gastric antrum; a few benign appearing gastric polyps; multiple biopsies 4. Duodenal inflammation was found in the duodenal bulb; multiple biopsies  1. Duodenum, Biopsy - FINDINGS CONSISTENT WITH PEPTIC DUODENITIS. - THERE IS NO EVIDENCE OF SIGNIFICANT VILLOUS ATROPHY, DYSPLASIA OR MALIGNANCY. 2. Stomach, biopsy - CHRONIC INACTIVE GASTRITIS WITH REACTIVE EPITHELIAL CHANGES. - THERE IS NO EVIDENCE OF HELICOBACTER PYLORI, DYSPLASIA OR MALIGNANCY. - SEE COMMENT. 3. Esophagogastric junction, biopsy - INTESTINAL METAPLASIA (GOBLET CELL METAPLASIA) CONSISTENT WITH BARRETT'S ESOPHAGUS. - THERE IS NO EVIDENCE OF DYSPLASIA OR MALIGNANCY.  Colonoscopy was not performed due to her state of health at that time.  Today she has several complaints including constant nausea, intermittent vomiting, lower abdominal pain/soreness, and poor appetite.  She also complains of ongoing black stools, which may have been tested heme positive in the SNF again.  No red or maroon stools.  She is mildly anemic, but Hgb seems to be  holding stable in upper 10 gram-lower 11 gram range (most recent Hgb on 12/27/2014 was 11.1 grams). MCV is normal at 99.  Ferritin is normal at 94, iron low-normal at 36, TIBC normal at 249.  She receives Aranesp weekly with dialysis.   Her daughter is most concerned about the nausea and inability to eat at this point.  She is on megace.  Has Zofran and phenergan, which she can receive prn.  She says that they do help, but she does not always ask for them.  Is on Protonix 40 mg once daily.   Current Medications, Allergies, Past Medical History, Past Surgical History, Family History and Social History were reviewed in [employer] record.   Physical Exam: BP 106/50 mmHg  Pulse 68  Ht 5\' 4"  (1.626 m)  Wt 204 lb (92.534 kg)  BMI 35.00 kg/m2 General:  Chronically ill-appearing white female in no acute distress; in wheelchair, on O2 Head: Normocephalic and atraumatic Eyes:  Sclerae anicteric, conjunctiva pink  Ears: Normal auditory acuity Lungs: Clear throughout to auscultation Heart: Regular rate and rhythm Abdomen: Soft, obese, non-distended.  Normal bowel sounds.  Mild diffuse TTP without R/R/G. Rectal:  Not performed. Musculoskeletal: Symmetrical with no gross deformities; generalized weakness  Extremities:  Trace edema noted.  Neurological: Alert oriented x 4, grossly non-focal Psychological:  Alert and cooperative. Normal mood and affect  Assessment and Recommendations: -79 year old female with multiple medical problems who presents to our office today with multiple GI complaints including lower abdominal pain, constant nausea, and intermittent vomiting.  Recent EGD with reflux esophagitis and gastritis.  Her nausea and intermittent vomiting may be multifactorial including her ESRD and dialysis as well as her  multiple medications.  Will check a CT scan of the abdomen and pelvis.  Will increase protonix to 40 mg BID.  Will add carafate tablet 4 times daily and will  schedule Zofran 4 mg every 4 hours ATC for now. -Mild normocytic anemia and heme positive stools:  Patient's anemia is likely also multifactorial.  MCV is normal at 99 as well as ferritin and other iron studies.  She receives Aranesp weekly with dialysis.  Hgb seems to be holding stable in upper 10 gram-lower 11 gram range.  EGD showed no bleeding source.  I do not think that patient is a candidate for colonoscopy at this time.    *Follow-up with Dr. Olevia Perches in about 4 weeks to reassess symptom management.

## 2015-01-12 DIAGNOSIS — D631 Anemia in chronic kidney disease: Secondary | ICD-10-CM | POA: Diagnosis not present

## 2015-01-12 DIAGNOSIS — R109 Unspecified abdominal pain: Secondary | ICD-10-CM | POA: Insufficient documentation

## 2015-01-12 DIAGNOSIS — E1122 Type 2 diabetes mellitus with diabetic chronic kidney disease: Secondary | ICD-10-CM | POA: Diagnosis not present

## 2015-01-12 DIAGNOSIS — D649 Anemia, unspecified: Secondary | ICD-10-CM | POA: Insufficient documentation

## 2015-01-12 DIAGNOSIS — N186 End stage renal disease: Secondary | ICD-10-CM | POA: Diagnosis not present

## 2015-01-12 DIAGNOSIS — R112 Nausea with vomiting, unspecified: Secondary | ICD-10-CM | POA: Insufficient documentation

## 2015-01-12 DIAGNOSIS — D62 Acute posthemorrhagic anemia: Secondary | ICD-10-CM | POA: Diagnosis not present

## 2015-01-12 NOTE — Progress Notes (Signed)
Reviewed, I will see her in 4 weeks, will review CT scan. I onder is low flow  State during dialysis may be causing ischemic bowl, hypomotility, ? Gastroparesis.

## 2015-01-15 ENCOUNTER — Telehealth: Payer: Self-pay | Admitting: Internal Medicine

## 2015-01-15 DIAGNOSIS — N186 End stage renal disease: Secondary | ICD-10-CM | POA: Diagnosis not present

## 2015-01-15 DIAGNOSIS — E1122 Type 2 diabetes mellitus with diabetic chronic kidney disease: Secondary | ICD-10-CM | POA: Diagnosis not present

## 2015-01-15 DIAGNOSIS — D631 Anemia in chronic kidney disease: Secondary | ICD-10-CM | POA: Diagnosis not present

## 2015-01-15 DIAGNOSIS — D62 Acute posthemorrhagic anemia: Secondary | ICD-10-CM | POA: Diagnosis not present

## 2015-01-15 NOTE — Telephone Encounter (Signed)
Yes, she may switch.  DB

## 2015-01-16 ENCOUNTER — Ambulatory Visit (INDEPENDENT_AMBULATORY_CARE_PROVIDER_SITE_OTHER)
Admission: RE | Admit: 2015-01-16 | Discharge: 2015-01-16 | Disposition: A | Discharge status: Home / Self Care | Payer: Medicare Other | Source: Ambulatory Visit | Attending: Gastroenterology | Admitting: Gastroenterology

## 2015-01-16 ENCOUNTER — Ambulatory Visit: Payer: Medicare Other | Admitting: Physician Assistant

## 2015-01-16 DIAGNOSIS — N281 Cyst of kidney, acquired: Secondary | ICD-10-CM | POA: Diagnosis not present

## 2015-01-16 DIAGNOSIS — R109 Unspecified abdominal pain: Secondary | ICD-10-CM

## 2015-01-16 DIAGNOSIS — N200 Calculus of kidney: Secondary | ICD-10-CM | POA: Diagnosis not present

## 2015-01-16 MED ORDER — IOHEXOL 350 MG/ML SOLN: 100.0000 mL | Freq: Once | INTRAVENOUS | Status: AC | PRN

## 2015-01-16 NOTE — Telephone Encounter (Signed)
ok 

## 2015-01-16 NOTE — Telephone Encounter (Signed)
Please advise 

## 2015-01-16 NOTE — Telephone Encounter (Signed)
Called and LM for Pt's daughter Kenney Houseman to return call to sch'd appt w/Dr. Hilarie Fredrickson

## 2015-01-17 ENCOUNTER — Ambulatory Visit: Payer: Medicare Other | Admitting: Physician Assistant

## 2015-01-17 ENCOUNTER — Other Ambulatory Visit: Payer: Self-pay | Admitting: *Deleted

## 2015-01-17 DIAGNOSIS — E1122 Type 2 diabetes mellitus with diabetic chronic kidney disease: Secondary | ICD-10-CM | POA: Diagnosis not present

## 2015-01-17 DIAGNOSIS — D631 Anemia in chronic kidney disease: Secondary | ICD-10-CM | POA: Diagnosis not present

## 2015-01-17 DIAGNOSIS — D62 Acute posthemorrhagic anemia: Secondary | ICD-10-CM | POA: Diagnosis not present

## 2015-01-17 DIAGNOSIS — N186 End stage renal disease: Secondary | ICD-10-CM | POA: Diagnosis not present

## 2015-01-17 MED ORDER — OXYCODONE HCL 5 MG PO TABS: ORAL_TABLET | ORAL | Status: DC

## 2015-01-17 NOTE — Telephone Encounter (Signed)
[facility] 

## 2015-01-19 DIAGNOSIS — D62 Acute posthemorrhagic anemia: Secondary | ICD-10-CM | POA: Diagnosis not present

## 2015-01-19 DIAGNOSIS — E1122 Type 2 diabetes mellitus with diabetic chronic kidney disease: Secondary | ICD-10-CM | POA: Diagnosis not present

## 2015-01-19 DIAGNOSIS — D631 Anemia in chronic kidney disease: Secondary | ICD-10-CM | POA: Diagnosis not present

## 2015-01-19 DIAGNOSIS — N186 End stage renal disease: Secondary | ICD-10-CM | POA: Diagnosis not present

## 2015-01-21 DIAGNOSIS — Z992 Dependence on renal dialysis: Secondary | ICD-10-CM | POA: Diagnosis not present

## 2015-01-21 DIAGNOSIS — N186 End stage renal disease: Secondary | ICD-10-CM | POA: Diagnosis not present

## 2015-01-22 DIAGNOSIS — Z23 Encounter for immunization: Secondary | ICD-10-CM | POA: Diagnosis not present

## 2015-01-22 DIAGNOSIS — E1122 Type 2 diabetes mellitus with diabetic chronic kidney disease: Secondary | ICD-10-CM | POA: Diagnosis not present

## 2015-01-22 DIAGNOSIS — N186 End stage renal disease: Secondary | ICD-10-CM | POA: Diagnosis not present

## 2015-01-24 DIAGNOSIS — E1122 Type 2 diabetes mellitus with diabetic chronic kidney disease: Secondary | ICD-10-CM | POA: Diagnosis not present

## 2015-01-24 DIAGNOSIS — Z23 Encounter for immunization: Secondary | ICD-10-CM | POA: Diagnosis not present

## 2015-01-24 DIAGNOSIS — N186 End stage renal disease: Secondary | ICD-10-CM | POA: Diagnosis not present

## 2015-01-25 ENCOUNTER — Encounter: Payer: Self-pay | Admitting: *Deleted

## 2015-01-25 ENCOUNTER — Encounter: Payer: Self-pay | Admitting: Internal Medicine

## 2015-01-26 DIAGNOSIS — E1122 Type 2 diabetes mellitus with diabetic chronic kidney disease: Secondary | ICD-10-CM | POA: Diagnosis not present

## 2015-01-26 DIAGNOSIS — Z23 Encounter for immunization: Secondary | ICD-10-CM | POA: Diagnosis not present

## 2015-01-26 DIAGNOSIS — N186 End stage renal disease: Secondary | ICD-10-CM | POA: Diagnosis not present

## 2015-01-29 DIAGNOSIS — N186 End stage renal disease: Secondary | ICD-10-CM | POA: Diagnosis not present

## 2015-01-29 DIAGNOSIS — E1122 Type 2 diabetes mellitus with diabetic chronic kidney disease: Secondary | ICD-10-CM | POA: Diagnosis not present

## 2015-01-29 DIAGNOSIS — Z23 Encounter for immunization: Secondary | ICD-10-CM | POA: Diagnosis not present

## 2015-01-31 ENCOUNTER — Encounter: Payer: Self-pay | Admitting: Vascular Surgery

## 2015-01-31 DIAGNOSIS — Z23 Encounter for immunization: Secondary | ICD-10-CM | POA: Diagnosis not present

## 2015-01-31 DIAGNOSIS — N186 End stage renal disease: Secondary | ICD-10-CM | POA: Diagnosis not present

## 2015-01-31 DIAGNOSIS — E1122 Type 2 diabetes mellitus with diabetic chronic kidney disease: Secondary | ICD-10-CM | POA: Diagnosis not present

## 2015-02-01 ENCOUNTER — Encounter: Payer: Self-pay | Admitting: Vascular Surgery

## 2015-02-01 ENCOUNTER — Encounter: Payer: Medicare Other | Admitting: Vascular Surgery

## 2015-02-01 ENCOUNTER — Ambulatory Visit (INDEPENDENT_AMBULATORY_CARE_PROVIDER_SITE_OTHER): Payer: Self-pay | Admitting: Vascular Surgery

## 2015-02-01 ENCOUNTER — Other Ambulatory Visit: Payer: Self-pay

## 2015-02-01 ENCOUNTER — Ambulatory Visit (HOSPITAL_COMMUNITY)
Admission: RE | Admit: 2015-02-01 | Discharge: 2015-02-01 | Disposition: A | Discharge status: Home / Self Care | Payer: No Typology Code available for payment source | Source: Ambulatory Visit | Attending: Vascular Surgery | Admitting: Vascular Surgery

## 2015-02-01 VITALS — BP 84/51 | HR 70 | Temp 98.4°F | Resp 14 | Ht 64.0 in | Wt 204.0 lb

## 2015-02-01 DIAGNOSIS — Z4931 Encounter for adequacy testing for hemodialysis: Secondary | ICD-10-CM | POA: Diagnosis not present

## 2015-02-01 DIAGNOSIS — N186 End stage renal disease: Secondary | ICD-10-CM

## 2015-02-01 NOTE — Progress Notes (Signed)
   Patient name: [name] James MRN: [id number] DOB: Mar 28, 1936 Sex: female  REASON FOR VISIT: Follow up after left radiocephalic AV fistula and placement of tunneled dialysis catheter.  HPI: [name] James is a 79 y.o. female who had a left radiocephalic AV fistula placed and a tunneled dialysis catheter placed on 11/30/2014. She dialyzes Monday Wednesdays and Fridays at noon. She has no specific complaints and denies pain or paresthesias in the left upper extremity. Her catheter has been working well.  REVIEW OF SYSTEMS: Valu.Nieves ] denotes positive finding; [  ] denotes negative finding  CARDIOVASCULAR:  [ ]  chest pain   [ ]  dyspnea on exertion    CONSTITUTIONAL:  [ ]  fever   [ ]  chills  PHYSICAL EXAM: Filed Vitals:   02/01/15 1200  BP: 84/51  Pulse: 70  Temp: 98.4 F (36.9 C)  TempSrc: Oral  Resp: 14  Height: 5\' 4"  (1.626 m)  Weight: 204 lb (92.534 kg)  SpO2: 99%   Body mass index is 35 kg/(m^2). GENERAL: The patient is a well-nourished female, in no acute distress. The vital signs are documented above. CARDIOVASCULAR: There is a regular rate and rhythm. PULMONARY: There is good air exchange bilaterally without wheezing or rales. Her incision at the left wrist is healing nicely. The fistula is pulsatile with the wrist and then is more difficult to follow in the mid forearm.  I have independently interpreted her duplex of her fistula. This shows that the diameters of her fistula ranged from 0.37 cm-0.57 cm. There is a branch in the distal forearm. There is also an area of stenosis adjacent to this area.  MEDICAL ISSUES: Based on her duplex findings and her physical exam, I suspect that she has a significant stenosis in the proximal fistula at the site of a valve. In addition there is a branch adjacent to this. Both of these appear to be compromising the maturation of her fistula. I have recommended that we proceed with a fistulogram. There is not a lot of room between the anastomosis  and the area of stenosis which may make this more challenging. Her procedure is scheduled for 02/12/2015. I'll make further recommendations pending these results.  [facility] [place] Beeper: [phone]

## 2015-02-02 DIAGNOSIS — Z23 Encounter for immunization: Secondary | ICD-10-CM | POA: Diagnosis not present

## 2015-02-02 DIAGNOSIS — N186 End stage renal disease: Secondary | ICD-10-CM | POA: Diagnosis not present

## 2015-02-02 DIAGNOSIS — E1122 Type 2 diabetes mellitus with diabetic chronic kidney disease: Secondary | ICD-10-CM | POA: Diagnosis not present

## 2015-02-05 DIAGNOSIS — E1122 Type 2 diabetes mellitus with diabetic chronic kidney disease: Secondary | ICD-10-CM | POA: Diagnosis not present

## 2015-02-05 DIAGNOSIS — Z23 Encounter for immunization: Secondary | ICD-10-CM | POA: Diagnosis not present

## 2015-02-05 DIAGNOSIS — N186 End stage renal disease: Secondary | ICD-10-CM | POA: Diagnosis not present

## 2015-02-07 DIAGNOSIS — N186 End stage renal disease: Secondary | ICD-10-CM | POA: Diagnosis not present

## 2015-02-07 DIAGNOSIS — Z23 Encounter for immunization: Secondary | ICD-10-CM | POA: Diagnosis not present

## 2015-02-07 DIAGNOSIS — E1122 Type 2 diabetes mellitus with diabetic chronic kidney disease: Secondary | ICD-10-CM | POA: Diagnosis not present

## 2015-02-09 ENCOUNTER — Ambulatory Visit: Payer: Medicare Other | Admitting: Internal Medicine

## 2015-02-09 DIAGNOSIS — E1122 Type 2 diabetes mellitus with diabetic chronic kidney disease: Secondary | ICD-10-CM | POA: Diagnosis not present

## 2015-02-09 DIAGNOSIS — Z23 Encounter for immunization: Secondary | ICD-10-CM | POA: Diagnosis not present

## 2015-02-09 DIAGNOSIS — N186 End stage renal disease: Secondary | ICD-10-CM | POA: Diagnosis not present

## 2015-02-11 MED ORDER — SODIUM CHLORIDE 0.9 % IJ SOLN: 3.0000 mL | INTRAMUSCULAR | Status: DC | PRN

## 2015-02-12 ENCOUNTER — Telehealth: Payer: Self-pay | Admitting: Vascular Surgery

## 2015-02-12 ENCOUNTER — Ambulatory Visit: Payer: Medicare Other | Admitting: Cardiovascular Disease

## 2015-02-12 ENCOUNTER — Ambulatory Visit (HOSPITAL_COMMUNITY)
Admission: RE | Admit: 2015-02-12 | Discharge: 2015-02-12 | Disposition: A | Discharge status: Home / Self Care | Payer: Medicare Other | Source: Ambulatory Visit | Attending: Vascular Surgery | Admitting: Vascular Surgery

## 2015-02-12 ENCOUNTER — Encounter (HOSPITAL_COMMUNITY): Payer: Self-pay | Admitting: Vascular Surgery

## 2015-02-12 ENCOUNTER — Encounter (HOSPITAL_COMMUNITY)
Admission: RE | Disposition: A | Discharge status: Home / Self Care | Payer: Self-pay | Source: Ambulatory Visit | Attending: Vascular Surgery

## 2015-02-12 ENCOUNTER — Other Ambulatory Visit: Payer: Self-pay | Admitting: *Deleted

## 2015-02-12 DIAGNOSIS — Z992 Dependence on renal dialysis: Secondary | ICD-10-CM | POA: Diagnosis not present

## 2015-02-12 DIAGNOSIS — Z4931 Encounter for adequacy testing for hemodialysis: Secondary | ICD-10-CM

## 2015-02-12 DIAGNOSIS — Z23 Encounter for immunization: Secondary | ICD-10-CM | POA: Diagnosis not present

## 2015-02-12 DIAGNOSIS — I770 Arteriovenous fistula, acquired: Secondary | ICD-10-CM | POA: Diagnosis not present

## 2015-02-12 DIAGNOSIS — E1122 Type 2 diabetes mellitus with diabetic chronic kidney disease: Secondary | ICD-10-CM | POA: Diagnosis not present

## 2015-02-12 DIAGNOSIS — I120 Hypertensive chronic kidney disease with stage 5 chronic kidney disease or end stage renal disease: Secondary | ICD-10-CM | POA: Insufficient documentation

## 2015-02-12 DIAGNOSIS — I70208 Unspecified atherosclerosis of native arteries of extremities, other extremity: Secondary | ICD-10-CM | POA: Insufficient documentation

## 2015-02-12 DIAGNOSIS — N186 End stage renal disease: Secondary | ICD-10-CM | POA: Insufficient documentation

## 2015-02-12 DIAGNOSIS — T82898A Other specified complication of vascular prosthetic devices, implants and grafts, initial encounter: Secondary | ICD-10-CM | POA: Diagnosis not present

## 2015-02-12 DIAGNOSIS — E119 Type 2 diabetes mellitus without complications: Secondary | ICD-10-CM | POA: Diagnosis not present

## 2015-02-12 HISTORY — PX: ANGIOPLASTY: SHX39

## 2015-02-12 HISTORY — PX: FISTULOGRAM: SHX5832

## 2015-02-12 LAB — POCT I-STAT, CHEM 8
BUN: 73 mg/dL — ABNORMAL HIGH (ref 6–23)
CHLORIDE: 98 mmol/L (ref 96–112)
CREATININE: 5.6 mg/dL — AB (ref 0.50–1.10)
Calcium, Ion: 1.23 mmol/L (ref 1.13–1.30)
Glucose, Bld: 179 mg/dL — ABNORMAL HIGH (ref 70–99)
HCT: 37 % (ref 36.0–46.0)
HEMOGLOBIN: 12.6 g/dL (ref 12.0–15.0)
POTASSIUM: 3.5 mmol/L (ref 3.5–5.1)
Sodium: 136 mmol/L (ref 135–145)
TCO2: 22 mmol/L (ref 0–100)

## 2015-02-12 LAB — GLUCOSE, CAPILLARY: Glucose-Capillary: 155 mg/dL — ABNORMAL HIGH (ref 70–99)

## 2015-02-12 SURGERY — FISTULOGRAM
Anesthesia: LOCAL

## 2015-02-12 MED ORDER — LIDOCAINE HCL (PF) 1 % IJ SOLN
INTRAMUSCULAR | Status: AC
  Filled 2015-02-12: qty 30

## 2015-02-12 MED ORDER — HEPARIN (PORCINE) IN NACL 2-0.9 UNIT/ML-% IJ SOLN
INTRAMUSCULAR | Status: AC
  Filled 2015-02-12: qty 500

## 2015-02-12 MED ORDER — HEPARIN SODIUM (PORCINE) 1000 UNIT/ML IJ SOLN
INTRAMUSCULAR | Status: AC
  Filled 2015-02-12: qty 1

## 2015-02-12 NOTE — Interval H&P Note (Signed)
History and Physical Interval Note:  02/12/2015 7:23 AM  [name] James  has presented today for surgery, with the diagnosis of stenosis of the proximal fistula  The various methods of treatment have been discussed with the patient and family. After consideration of risks, benefits and other options for treatment, the patient has consented to  Procedure(s): FISTULOGRAM (N/A) as a surgical intervention .  The patient's history has been reviewed, patient examined, no change in status, stable for surgery.  I have reviewed the patient's chart and labs.  Questions were answered to the patient's satisfaction.     DICKSON,CHRISTOPHER S

## 2015-02-12 NOTE — H&P (View-Only) (Signed)
   Patient name: [name] James MRN: [id number] DOB: 10/16/1936 Sex: female  REASON FOR VISIT: Follow up after left radiocephalic AV fistula and placement of tunneled dialysis catheter.  HPI: [name] James is a 79 y.o. female who had a left radiocephalic AV fistula placed and a tunneled dialysis catheter placed on 11/30/2014. She dialyzes Monday Wednesdays and Fridays at noon. She has no specific complaints and denies pain or paresthesias in the left upper extremity. Her catheter has been working well.  REVIEW OF SYSTEMS: Valu.Nieves ] denotes positive finding; [  ] denotes negative finding  CARDIOVASCULAR:  [ ]  chest pain   [ ]  dyspnea on exertion    CONSTITUTIONAL:  [ ]  fever   [ ]  chills  PHYSICAL EXAM: Filed Vitals:   02/01/15 1200  BP: 84/51  Pulse: 70  Temp: 98.4 F (36.9 C)  TempSrc: Oral  Resp: 14  Height: 5\' 4"  (1.626 m)  Weight: 204 lb (92.534 kg)  SpO2: 99%   Body mass index is 35 kg/(m^2). GENERAL: The patient is a well-nourished female, in no acute distress. The vital signs are documented above. CARDIOVASCULAR: There is a regular rate and rhythm. PULMONARY: There is good air exchange bilaterally without wheezing or rales. Her incision at the left wrist is healing nicely. The fistula is pulsatile with the wrist and then is more difficult to follow in the mid forearm.  I have independently interpreted her duplex of her fistula. This shows that the diameters of her fistula ranged from 0.37 cm-0.57 cm. There is a branch in the distal forearm. There is also an area of stenosis adjacent to this area.  MEDICAL ISSUES: Based on her duplex findings and her physical exam, I suspect that she has a significant stenosis in the proximal fistula at the site of a valve. In addition there is a branch adjacent to this. Both of these appear to be compromising the maturation of her fistula. I have recommended that we proceed with a fistulogram. There is not a lot of room between the anastomosis  and the area of stenosis which may make this more challenging. Her procedure is scheduled for 02/12/2015. I'll make further recommendations pending these results.  [facility] [place] Beeper: [phone]

## 2015-02-12 NOTE — Telephone Encounter (Signed)
-----   Message from Mena Goes, RN sent at 02/12/2015  9:30 AM EST ----- Regarding: Schedule   ----- Message -----    From: Angelia Mould, MD    Sent: 02/12/2015   8:19 AM      To: Vvs Charge Pool Subject: charge and f/u                                 This patient had ultrasound-guided access to the left radiocephalic AV fistula, fistulogram, and balloon angioplasty of the fistula. She needs a follow up visit in 4 weeks with a duplex at that time to check on the size of the fistula. Thanks CD

## 2015-02-12 NOTE — Op Note (Signed)
PATIENT: [name] James   MRN: [id number] DOB: 10/20/1936    DATE OF PROCEDURE: 02/12/2015  INDICATIONS: [name] James is a 79 y.o. female who had a left radiocephalic AV fistula and a tunneled dialysis catheter placed on 11/30/2014. A follow up duplex scan the vein had not increased significantly in size. She presents for a fistulogram.  PROCEDURE:  1. Ultrasound-guided access to the left radial cephalic AV fistula 2. Fistulogram left radial cephalic AV fistula 3. Venoplasty of stenosis of left cephalic vein with 3 mm x 2 cm balloon and 4 mm x 2 cm balloon.  SURGEON: Christopher S. Dickson, MD, FACS  ANESTHESIA: local   EBL: minimal  TECHNIQUE: The patient was taken to the peripheral vascular lab. The left arm was prepped and draped in the usual sterile fashion. Under ultrasound guidance, after the skin was anesthetized, the proximal fistula was cannulated with a micropuncture needle and a micropuncture sheath was introduced over a wire. A fistulogram was obtained evaluating the fistula from the cannulation site to the central veins. There was a tight 80% stenosis in the proximal fistula. I elected to address this with venoplasty.  The micropuncture sheath was exchanged for a short 6 French sheath over a Benson wire. The patient received 3000 units of IV heparin. The stenosis was crossed with a [place] core wire. The 3 mm x 2 cm balloon was positioned across the stenosis and inflated to 14 atm for 1 minute. Completion film showed some residual stenosis. I therefore went back with a 4 mm x 2 cm balloon which was inflated to 14 atm for 1 minute. Completion films showed no residual stenosis. At the completion the cannulation site was closed with 4-0 Monocryl suture. Pressure was applied. No immediate competitions were noted.  FINDINGS:  1. Patent left radiocephalic AV fistula with several small, nonsignificant, competing branches identified.  2. 80% stenosis of the proximal left radiocephalic  fistula which was successfully ballooned as described above with no significant residual stenosis. 3. Patent cephalic vein, basilic vein, brachial vein, with no central venous stenosis noted.  CRITICAL NOTE: I will arrange for her to have a follow duplex scan in 4 weeks. In the meantime she should continue to use her catheter for dialysis.  Christopher Dickson, MD, FACS [facility]  DATE OF DICTATION:   02/12/2015    

## 2015-02-12 NOTE — Discharge Instructions (Signed)
Fistulogram, Care After °Refer to this sheet in the next few weeks. These instructions provide you with information on caring for yourself after your procedure. Your health care provider may also give you more specific instructions. Your treatment has been planned according to current medical practices, but problems sometimes occur. Call your health care provider if you have any problems or questions after your procedure. °WHAT TO EXPECT AFTER THE PROCEDURE °After your procedure, it is typical to have the following: °· A small amount of discomfort in the area where the catheters were placed. °· A small amount of bruising around the fistula. °· Sleepiness and fatigue. °HOME CARE INSTRUCTIONS °· Rest at home for the day following your procedure. °· Do not drive or operate heavy machinery while taking pain medicine. °· Take medicines only as directed by your health care provider. °· Do not take baths, swim, or use a hot tub until your health care provider approves. You may shower 24 hours after the procedure or as directed by your health care provider. °· There are many different ways to close and cover an incision, including stitches, skin glue, and adhesive strips. Follow your health care provider's instructions on: °¨ Incision care. °¨ Bandage (dressing) changes and removal. °¨ Incision closure removal. °· Monitor your dialysis fistula carefully. °SEEK MEDICAL CARE IF: °· You have drainage, redness, swelling, or pain at your catheter site. °· You have a fever. °· You have chills. °SEEK IMMEDIATE MEDICAL CARE IF: °· You feel weak. °· You have trouble balancing. °· You have trouble moving your arms or legs. °· You have problems with your speech or vision. °· You can no longer feel a vibration or buzz when you put your fingers over your dialysis fistula. °· The limb that was used for the procedure: °¨ Swells. °¨ Is painful. °¨ Is cold. °¨ Is discolored, such as blue or pale white. °Document Released: 04/24/2014  Document Reviewed: 01/27/2014 °ExitCare® Patient Information ©2015 [employer]. This information is not intended to replace advice given to you by your health care provider. Make sure you discuss any questions you have with your health care provider. ° °

## 2015-02-12 NOTE — Telephone Encounter (Signed)
LM for pt re appt, dpm °

## 2015-02-14 DIAGNOSIS — N186 End stage renal disease: Secondary | ICD-10-CM | POA: Diagnosis not present

## 2015-02-14 DIAGNOSIS — Z23 Encounter for immunization: Secondary | ICD-10-CM | POA: Diagnosis not present

## 2015-02-14 DIAGNOSIS — E1122 Type 2 diabetes mellitus with diabetic chronic kidney disease: Secondary | ICD-10-CM | POA: Diagnosis not present

## 2015-02-15 ENCOUNTER — Ambulatory Visit: Payer: Medicare Other | Admitting: Cardiovascular Disease

## 2015-02-16 DIAGNOSIS — Z23 Encounter for immunization: Secondary | ICD-10-CM | POA: Diagnosis not present

## 2015-02-16 DIAGNOSIS — E1122 Type 2 diabetes mellitus with diabetic chronic kidney disease: Secondary | ICD-10-CM | POA: Diagnosis not present

## 2015-02-16 DIAGNOSIS — N186 End stage renal disease: Secondary | ICD-10-CM | POA: Diagnosis not present

## 2015-02-19 DIAGNOSIS — Z23 Encounter for immunization: Secondary | ICD-10-CM | POA: Diagnosis not present

## 2015-02-19 DIAGNOSIS — Z992 Dependence on renal dialysis: Secondary | ICD-10-CM | POA: Diagnosis not present

## 2015-02-19 DIAGNOSIS — N186 End stage renal disease: Secondary | ICD-10-CM | POA: Diagnosis not present

## 2015-02-19 DIAGNOSIS — E1122 Type 2 diabetes mellitus with diabetic chronic kidney disease: Secondary | ICD-10-CM | POA: Diagnosis not present

## 2015-02-20 ENCOUNTER — Ambulatory Visit: Payer: Medicare Other | Admitting: Internal Medicine

## 2015-02-21 DIAGNOSIS — E611 Iron deficiency: Secondary | ICD-10-CM | POA: Diagnosis not present

## 2015-02-21 DIAGNOSIS — Z23 Encounter for immunization: Secondary | ICD-10-CM | POA: Diagnosis not present

## 2015-02-21 DIAGNOSIS — D631 Anemia in chronic kidney disease: Secondary | ICD-10-CM | POA: Diagnosis not present

## 2015-02-21 DIAGNOSIS — D509 Iron deficiency anemia, unspecified: Secondary | ICD-10-CM | POA: Diagnosis not present

## 2015-02-21 DIAGNOSIS — N186 End stage renal disease: Secondary | ICD-10-CM | POA: Diagnosis not present

## 2015-02-21 DIAGNOSIS — E1122 Type 2 diabetes mellitus with diabetic chronic kidney disease: Secondary | ICD-10-CM | POA: Diagnosis not present

## 2015-02-23 DIAGNOSIS — I10 Essential (primary) hypertension: Secondary | ICD-10-CM | POA: Diagnosis not present

## 2015-02-24 DIAGNOSIS — E611 Iron deficiency: Secondary | ICD-10-CM | POA: Diagnosis not present

## 2015-02-24 DIAGNOSIS — N186 End stage renal disease: Secondary | ICD-10-CM | POA: Diagnosis not present

## 2015-02-24 DIAGNOSIS — E1122 Type 2 diabetes mellitus with diabetic chronic kidney disease: Secondary | ICD-10-CM | POA: Diagnosis not present

## 2015-02-24 DIAGNOSIS — D509 Iron deficiency anemia, unspecified: Secondary | ICD-10-CM | POA: Diagnosis not present

## 2015-02-24 DIAGNOSIS — D631 Anemia in chronic kidney disease: Secondary | ICD-10-CM | POA: Diagnosis not present

## 2015-02-24 DIAGNOSIS — Z23 Encounter for immunization: Secondary | ICD-10-CM | POA: Diagnosis not present

## 2015-02-26 DIAGNOSIS — E611 Iron deficiency: Secondary | ICD-10-CM | POA: Diagnosis not present

## 2015-02-26 DIAGNOSIS — Z23 Encounter for immunization: Secondary | ICD-10-CM | POA: Diagnosis not present

## 2015-02-26 DIAGNOSIS — E1122 Type 2 diabetes mellitus with diabetic chronic kidney disease: Secondary | ICD-10-CM | POA: Diagnosis not present

## 2015-02-26 DIAGNOSIS — N186 End stage renal disease: Secondary | ICD-10-CM | POA: Diagnosis not present

## 2015-02-26 DIAGNOSIS — D509 Iron deficiency anemia, unspecified: Secondary | ICD-10-CM | POA: Diagnosis not present

## 2015-02-26 DIAGNOSIS — D631 Anemia in chronic kidney disease: Secondary | ICD-10-CM | POA: Diagnosis not present

## 2015-02-27 DIAGNOSIS — N390 Urinary tract infection, site not specified: Secondary | ICD-10-CM | POA: Diagnosis not present

## 2015-02-27 DIAGNOSIS — R6889 Other general symptoms and signs: Secondary | ICD-10-CM | POA: Diagnosis not present

## 2015-02-28 DIAGNOSIS — N186 End stage renal disease: Secondary | ICD-10-CM | POA: Diagnosis not present

## 2015-02-28 DIAGNOSIS — Z23 Encounter for immunization: Secondary | ICD-10-CM | POA: Diagnosis not present

## 2015-02-28 DIAGNOSIS — D631 Anemia in chronic kidney disease: Secondary | ICD-10-CM | POA: Diagnosis not present

## 2015-02-28 DIAGNOSIS — E611 Iron deficiency: Secondary | ICD-10-CM | POA: Diagnosis not present

## 2015-02-28 DIAGNOSIS — E1122 Type 2 diabetes mellitus with diabetic chronic kidney disease: Secondary | ICD-10-CM | POA: Diagnosis not present

## 2015-02-28 DIAGNOSIS — D509 Iron deficiency anemia, unspecified: Secondary | ICD-10-CM | POA: Diagnosis not present

## 2015-03-02 DIAGNOSIS — E1122 Type 2 diabetes mellitus with diabetic chronic kidney disease: Secondary | ICD-10-CM | POA: Diagnosis not present

## 2015-03-02 DIAGNOSIS — Z23 Encounter for immunization: Secondary | ICD-10-CM | POA: Diagnosis not present

## 2015-03-02 DIAGNOSIS — D631 Anemia in chronic kidney disease: Secondary | ICD-10-CM | POA: Diagnosis not present

## 2015-03-02 DIAGNOSIS — E611 Iron deficiency: Secondary | ICD-10-CM | POA: Diagnosis not present

## 2015-03-02 DIAGNOSIS — N186 End stage renal disease: Secondary | ICD-10-CM | POA: Diagnosis not present

## 2015-03-02 DIAGNOSIS — D509 Iron deficiency anemia, unspecified: Secondary | ICD-10-CM | POA: Diagnosis not present

## 2015-03-05 DIAGNOSIS — E611 Iron deficiency: Secondary | ICD-10-CM | POA: Diagnosis not present

## 2015-03-05 DIAGNOSIS — Z23 Encounter for immunization: Secondary | ICD-10-CM | POA: Diagnosis not present

## 2015-03-05 DIAGNOSIS — N186 End stage renal disease: Secondary | ICD-10-CM | POA: Diagnosis not present

## 2015-03-05 DIAGNOSIS — D509 Iron deficiency anemia, unspecified: Secondary | ICD-10-CM | POA: Diagnosis not present

## 2015-03-05 DIAGNOSIS — D631 Anemia in chronic kidney disease: Secondary | ICD-10-CM | POA: Diagnosis not present

## 2015-03-05 DIAGNOSIS — E1122 Type 2 diabetes mellitus with diabetic chronic kidney disease: Secondary | ICD-10-CM | POA: Diagnosis not present

## 2015-03-07 ENCOUNTER — Other Ambulatory Visit: Payer: Self-pay

## 2015-03-07 DIAGNOSIS — N186 End stage renal disease: Secondary | ICD-10-CM | POA: Diagnosis not present

## 2015-03-07 DIAGNOSIS — Z23 Encounter for immunization: Secondary | ICD-10-CM | POA: Diagnosis not present

## 2015-03-07 DIAGNOSIS — D509 Iron deficiency anemia, unspecified: Secondary | ICD-10-CM | POA: Diagnosis not present

## 2015-03-07 DIAGNOSIS — E1122 Type 2 diabetes mellitus with diabetic chronic kidney disease: Secondary | ICD-10-CM | POA: Diagnosis not present

## 2015-03-07 DIAGNOSIS — E611 Iron deficiency: Secondary | ICD-10-CM | POA: Diagnosis not present

## 2015-03-07 DIAGNOSIS — D631 Anemia in chronic kidney disease: Secondary | ICD-10-CM | POA: Diagnosis not present

## 2015-03-08 ENCOUNTER — Other Ambulatory Visit (HOSPITAL_COMMUNITY): Payer: Medicare Other

## 2015-03-09 ENCOUNTER — Other Ambulatory Visit: Payer: Self-pay | Admitting: *Deleted

## 2015-03-09 DIAGNOSIS — N186 End stage renal disease: Secondary | ICD-10-CM | POA: Diagnosis not present

## 2015-03-09 DIAGNOSIS — E1122 Type 2 diabetes mellitus with diabetic chronic kidney disease: Secondary | ICD-10-CM | POA: Diagnosis not present

## 2015-03-09 DIAGNOSIS — E611 Iron deficiency: Secondary | ICD-10-CM | POA: Diagnosis not present

## 2015-03-09 DIAGNOSIS — D509 Iron deficiency anemia, unspecified: Secondary | ICD-10-CM | POA: Diagnosis not present

## 2015-03-09 DIAGNOSIS — Z23 Encounter for immunization: Secondary | ICD-10-CM | POA: Diagnosis not present

## 2015-03-09 DIAGNOSIS — D631 Anemia in chronic kidney disease: Secondary | ICD-10-CM | POA: Diagnosis not present

## 2015-03-09 MED ORDER — DIAZEPAM 5 MG PO TABS: ORAL_TABLET | ORAL | Status: DC

## 2015-03-09 NOTE — Telephone Encounter (Signed)
[facility] 

## 2015-03-12 DIAGNOSIS — E1122 Type 2 diabetes mellitus with diabetic chronic kidney disease: Secondary | ICD-10-CM | POA: Diagnosis not present

## 2015-03-12 DIAGNOSIS — D509 Iron deficiency anemia, unspecified: Secondary | ICD-10-CM | POA: Diagnosis not present

## 2015-03-12 DIAGNOSIS — D631 Anemia in chronic kidney disease: Secondary | ICD-10-CM | POA: Diagnosis not present

## 2015-03-12 DIAGNOSIS — E611 Iron deficiency: Secondary | ICD-10-CM | POA: Diagnosis not present

## 2015-03-12 DIAGNOSIS — N186 End stage renal disease: Secondary | ICD-10-CM | POA: Diagnosis not present

## 2015-03-12 DIAGNOSIS — Z23 Encounter for immunization: Secondary | ICD-10-CM | POA: Diagnosis not present

## 2015-03-13 ENCOUNTER — Encounter: Payer: Self-pay | Admitting: Vascular Surgery

## 2015-03-14 ENCOUNTER — Encounter: Payer: Medicare Other | Admitting: Vascular Surgery

## 2015-03-14 DIAGNOSIS — N186 End stage renal disease: Secondary | ICD-10-CM | POA: Diagnosis not present

## 2015-03-14 DIAGNOSIS — E1122 Type 2 diabetes mellitus with diabetic chronic kidney disease: Secondary | ICD-10-CM | POA: Diagnosis not present

## 2015-03-14 DIAGNOSIS — D631 Anemia in chronic kidney disease: Secondary | ICD-10-CM | POA: Diagnosis not present

## 2015-03-14 DIAGNOSIS — E611 Iron deficiency: Secondary | ICD-10-CM | POA: Diagnosis not present

## 2015-03-14 DIAGNOSIS — Z23 Encounter for immunization: Secondary | ICD-10-CM | POA: Diagnosis not present

## 2015-03-14 DIAGNOSIS — D509 Iron deficiency anemia, unspecified: Secondary | ICD-10-CM | POA: Diagnosis not present

## 2015-03-16 DIAGNOSIS — E611 Iron deficiency: Secondary | ICD-10-CM | POA: Diagnosis not present

## 2015-03-16 DIAGNOSIS — E1122 Type 2 diabetes mellitus with diabetic chronic kidney disease: Secondary | ICD-10-CM | POA: Diagnosis not present

## 2015-03-16 DIAGNOSIS — D509 Iron deficiency anemia, unspecified: Secondary | ICD-10-CM | POA: Diagnosis not present

## 2015-03-16 DIAGNOSIS — Z23 Encounter for immunization: Secondary | ICD-10-CM | POA: Diagnosis not present

## 2015-03-16 DIAGNOSIS — N186 End stage renal disease: Secondary | ICD-10-CM | POA: Diagnosis not present

## 2015-03-16 DIAGNOSIS — D631 Anemia in chronic kidney disease: Secondary | ICD-10-CM | POA: Diagnosis not present

## 2015-03-19 ENCOUNTER — Encounter (HOSPITAL_COMMUNITY): Payer: Self-pay | Admitting: Vascular Surgery

## 2015-03-19 DIAGNOSIS — E1122 Type 2 diabetes mellitus with diabetic chronic kidney disease: Secondary | ICD-10-CM | POA: Diagnosis not present

## 2015-03-19 DIAGNOSIS — D631 Anemia in chronic kidney disease: Secondary | ICD-10-CM | POA: Diagnosis not present

## 2015-03-19 DIAGNOSIS — D509 Iron deficiency anemia, unspecified: Secondary | ICD-10-CM | POA: Diagnosis not present

## 2015-03-19 DIAGNOSIS — N186 End stage renal disease: Secondary | ICD-10-CM | POA: Diagnosis not present

## 2015-03-19 DIAGNOSIS — Z23 Encounter for immunization: Secondary | ICD-10-CM | POA: Diagnosis not present

## 2015-03-19 DIAGNOSIS — E611 Iron deficiency: Secondary | ICD-10-CM | POA: Diagnosis not present

## 2015-03-19 MED ORDER — SODIUM CHLORIDE 0.9 % IV SOLN
INTRAVENOUS | Status: DC
  Administered 2015-03-20: 35 mL/h via INTRAVENOUS

## 2015-03-19 MED ORDER — CHLORHEXIDINE GLUCONATE CLOTH 2 % EX PADS: 6.0000 | MEDICATED_PAD | Freq: Once | CUTANEOUS | Status: DC

## 2015-03-19 MED ORDER — VANCOMYCIN HCL IN DEXTROSE 1-5 GM/200ML-% IV SOLN
1000.0000 mg | INTRAVENOUS | Status: AC
  Administered 2015-03-20: 1000 mg via INTRAVENOUS
  Filled 2015-03-19: qty 200

## 2015-03-19 NOTE — Progress Notes (Signed)
Pt SDW- Pre-op call completed by Kathlee Nations, Medical sales representative at [facility]. Please complete anesthesia complications assessment and  Sleep Apnea Screening on DOS. Kathlee Nations made aware to not administer and diabetic medications the morning of surgery and to stop herbal medications and NSAID's. Kathlee Nations asked to fax copy of pt MAR (POS). Kathlee Nations verbalized understanding of all pre-op instructions.

## 2015-03-19 NOTE — Progress Notes (Signed)
Anesthesia Chart Review:  SAME DAY WORK-UP.    Patient is a 79 year old female scheduled for left AVF versus AVGG on 03/20/15 by Dr. Scot Dock.  Patient has ESRD with HD on MWF.  She is s/p left Cimino AVF and HD catheter on 11/30/14 that required PTA 02/12/15.   Other history includes non-smoker, DM on insulin, peripheral neuropathy, afib/PAF, CHF, atypical chest pain, HTN, anxiety, GERD, gout, breast cancer s/p bilateral mastectomies '80's, dyslipidemia, anemia, family and personal history of post-operative N/V.  She was admitted on 12/19/14 with rapid afib with chest pain and mild elevation in troponin, consider demand ischemia.  She had a normal echo and low risk stress test (see below). Cardiologist Dr. Sallyanne Kuster did not recommend a cath. She was not felt to be a candidate for warfarin at that time due to recent melanotic stools.  GI is Dr. Olevia Perches.  Recent EGD showed reflux esophagitis, Barrett's esophagus, gastritis and duodenal inflammation (peptic duodenitis), intestinal metaplasia. In 12/2014 she reported continued tarry, heme positive stools at [facility]. Colonoscopy was not pursued at that time due to her current state of health.  CT of the abd/pelvis showed no findings to account for her abdominal symptoms. BMI is consistent with obesity. PCP is listed as Dr. Shirline Frees.  Meds include amiodarone, Lipitor, Phoslo, Aranesp, Valium, Cosopt, hydralazine, Novolog, Levemir, Protonix, Percocet, Phenergan.  12/19/14 EKG: SR. 12/20/14 EKG: afib with RVR at 132 bpm, nonspecific ST/T wave abnormality. To me, her 12/22/14 discharge summary implies that she was in rate controlled afib at discharge. Will order an EKG on arrival to get a definitive baseline rhythm.   12/20/14 nuclear stress test: IMPRESSION: 1. No reversible ischemia. Small mid inferolateral wall infarct. 2. Normal left ventricular wall motion. 3. Left ventricular ejection fraction 68% 4. Low-risk stress test findings*.  12/19/14  Echo:  - Left ventricle: The cavity size was normal. Wall thickness was increased in a pattern of moderate LVH. Systolic function wasnormal. The estimated ejection fraction was in the range of 55% to 60%. Wall motion was normal; there were no regional wallmotion abnormalities. Doppler parameters are consistent withabnormal left ventricular relaxation (grade 1 diastolicdysfunction). - Mitral valve: Calcified annulus. Mildly thickened leaflets .  12/18/14 1V CXR: Hazy density in LEFT lung base may reflect small layering pleural effusion, no frank pulmonary edema on today's examination.  She had recent recurrent afib with cardiology evaluation and testing as outlined above.  No anticoagulation currently due to heme positive stools. She is for labs on arrival.  Repeat EKG as above. She is a same day work-up, so she will need further evaluation on the day of surgery to ensure her rhythm is stable and no acute CV/CHF symptoms.   George Hugh [facility]/Anesthesiology Phone [phone] 03/19/2015 10:27 AM

## 2015-03-20 ENCOUNTER — Ambulatory Visit (HOSPITAL_COMMUNITY)
Admission: RE | Admit: 2015-03-20 | Discharge: 2015-03-20 | Disposition: A | Discharge status: Home / Self Care | Payer: Medicare Other | Source: Ambulatory Visit | Attending: Vascular Surgery | Admitting: Vascular Surgery

## 2015-03-20 ENCOUNTER — Ambulatory Visit (HOSPITAL_COMMUNITY): Payer: Medicare Other | Admitting: Vascular Surgery

## 2015-03-20 ENCOUNTER — Encounter (HOSPITAL_COMMUNITY)
Admission: RE | Disposition: A | Discharge status: Home / Self Care | Payer: Self-pay | Source: Ambulatory Visit | Attending: Vascular Surgery

## 2015-03-20 ENCOUNTER — Encounter (HOSPITAL_COMMUNITY): Payer: Self-pay | Admitting: *Deleted

## 2015-03-20 DIAGNOSIS — Z7982 Long term (current) use of aspirin: Secondary | ICD-10-CM | POA: Insufficient documentation

## 2015-03-20 DIAGNOSIS — N185 Chronic kidney disease, stage 5: Secondary | ICD-10-CM | POA: Insufficient documentation

## 2015-03-20 DIAGNOSIS — F419 Anxiety disorder, unspecified: Secondary | ICD-10-CM | POA: Insufficient documentation

## 2015-03-20 DIAGNOSIS — T82898A Other specified complication of vascular prosthetic devices, implants and grafts, initial encounter: Secondary | ICD-10-CM | POA: Diagnosis not present

## 2015-03-20 DIAGNOSIS — N186 End stage renal disease: Secondary | ICD-10-CM | POA: Diagnosis not present

## 2015-03-20 DIAGNOSIS — E119 Type 2 diabetes mellitus without complications: Secondary | ICD-10-CM | POA: Insufficient documentation

## 2015-03-20 DIAGNOSIS — I4891 Unspecified atrial fibrillation: Secondary | ICD-10-CM | POA: Insufficient documentation

## 2015-03-20 DIAGNOSIS — Z6834 Body mass index (BMI) 34.0-34.9, adult: Secondary | ICD-10-CM | POA: Diagnosis not present

## 2015-03-20 DIAGNOSIS — I120 Hypertensive chronic kidney disease with stage 5 chronic kidney disease or end stage renal disease: Secondary | ICD-10-CM | POA: Insufficient documentation

## 2015-03-20 DIAGNOSIS — I509 Heart failure, unspecified: Secondary | ICD-10-CM | POA: Insufficient documentation

## 2015-03-20 DIAGNOSIS — Z794 Long term (current) use of insulin: Secondary | ICD-10-CM | POA: Diagnosis not present

## 2015-03-20 HISTORY — PX: AV FISTULA PLACEMENT: SHX1204

## 2015-03-20 HISTORY — PX: LIGATION OF ARTERIOVENOUS  FISTULA: SHX5948

## 2015-03-20 LAB — POCT I-STAT 4, (NA,K, GLUC, HGB,HCT)
Glucose, Bld: 149 mg/dL — ABNORMAL HIGH (ref 70–99)
HEMATOCRIT: 32 % — AB (ref 36.0–46.0)
Hemoglobin: 10.9 g/dL — ABNORMAL LOW (ref 12.0–15.0)
Potassium: 3.7 mmol/L (ref 3.5–5.1)
SODIUM: 141 mmol/L (ref 135–145)

## 2015-03-20 LAB — GLUCOSE, CAPILLARY: GLUCOSE-CAPILLARY: 124 mg/dL — AB (ref 70–99)

## 2015-03-20 SURGERY — ARTERIOVENOUS (AV) FISTULA CREATION
Anesthesia: Monitor Anesthesia Care | Site: Arm Upper | Laterality: Left

## 2015-03-20 MED ORDER — PROTAMINE SULFATE 10 MG/ML IV SOLN
INTRAVENOUS | Status: AC
  Filled 2015-03-20: qty 5

## 2015-03-20 MED ORDER — LIDOCAINE HCL (PF) 1 % IJ SOLN
INTRAMUSCULAR | Status: DC | PRN
  Administered 2015-03-20: 21 mL

## 2015-03-20 MED ORDER — THROMBIN 20000 UNITS EX SOLR
CUTANEOUS | Status: DC | PRN
  Administered 2015-03-20: 20 mL via TOPICAL

## 2015-03-20 MED ORDER — THROMBIN 20000 UNITS EX SOLR
CUTANEOUS | Status: AC
  Filled 2015-03-20: qty 20000

## 2015-03-20 MED ORDER — EPHEDRINE SULFATE 50 MG/ML IJ SOLN
INTRAMUSCULAR | Status: DC | PRN
  Administered 2015-03-20 (×2): 10 mg via INTRAVENOUS
  Administered 2015-03-20 (×3): 5 mg via INTRAVENOUS
  Administered 2015-03-20: 10 mg via INTRAVENOUS
  Administered 2015-03-20: 5 mg via INTRAVENOUS

## 2015-03-20 MED ORDER — PHENYLEPHRINE 40 MCG/ML (10ML) SYRINGE FOR IV PUSH (FOR BLOOD PRESSURE SUPPORT)
PREFILLED_SYRINGE | INTRAVENOUS | Status: AC
  Filled 2015-03-20: qty 10

## 2015-03-20 MED ORDER — HEPARIN SODIUM (PORCINE) 1000 UNIT/ML IJ SOLN
INTRAMUSCULAR | Status: AC
  Filled 2015-03-20: qty 1

## 2015-03-20 MED ORDER — PROTAMINE SULFATE 10 MG/ML IV SOLN
INTRAVENOUS | Status: DC | PRN
  Administered 2015-03-20: 10 mg via INTRAVENOUS
  Administered 2015-03-20: 40 mg via INTRAVENOUS

## 2015-03-20 MED ORDER — PHENYLEPHRINE HCL 10 MG/ML IJ SOLN
INTRAMUSCULAR | Status: DC | PRN
  Administered 2015-03-20 (×2): 80 ug via INTRAVENOUS
  Administered 2015-03-20: 120 ug via INTRAVENOUS
  Administered 2015-03-20: 40 ug via INTRAVENOUS
  Administered 2015-03-20: 80 ug via INTRAVENOUS
  Administered 2015-03-20: 120 ug via INTRAVENOUS
  Administered 2015-03-20: 40 ug via INTRAVENOUS
  Administered 2015-03-20 (×3): 80 ug via INTRAVENOUS

## 2015-03-20 MED ORDER — HEPARIN SODIUM (PORCINE) 1000 UNIT/ML IJ SOLN
INTRAMUSCULAR | Status: DC | PRN
  Administered 2015-03-20: 7000 [IU] via INTRAVENOUS

## 2015-03-20 MED ORDER — SODIUM CHLORIDE 0.9 % IV SOLN
INTRAVENOUS | Status: DC | PRN
  Administered 2015-03-20: 13:00:00 via INTRAVENOUS

## 2015-03-20 MED ORDER — SODIUM CHLORIDE 0.9 % IR SOLN
Status: DC | PRN
  Administered 2015-03-20: 500 mL

## 2015-03-20 MED ORDER — ONDANSETRON HCL 4 MG/2ML IJ SOLN
INTRAMUSCULAR | Status: AC
Start: 2015-03-20 — End: 2015-03-20
  Filled 2015-03-20: qty 2

## 2015-03-20 MED ORDER — FENTANYL CITRATE 0.05 MG/ML IJ SOLN
INTRAMUSCULAR | Status: AC
  Filled 2015-03-20: qty 5

## 2015-03-20 MED ORDER — PROPOFOL INFUSION 10 MG/ML OPTIME
INTRAVENOUS | Status: DC | PRN
  Administered 2015-03-20: 50 ug/kg/min via INTRAVENOUS

## 2015-03-20 MED ORDER — FENTANYL CITRATE 0.05 MG/ML IJ SOLN
INTRAMUSCULAR | Status: DC | PRN
  Administered 2015-03-20: 25 ug via INTRAVENOUS

## 2015-03-20 MED ORDER — 0.9 % SODIUM CHLORIDE (POUR BTL) OPTIME
TOPICAL | Status: DC | PRN
  Administered 2015-03-20: 1000 mL

## 2015-03-20 MED ORDER — LIDOCAINE HCL (PF) 1 % IJ SOLN
INTRAMUSCULAR | Status: AC
  Filled 2015-03-20: qty 30

## 2015-03-20 MED ORDER — OXYCODONE HCL 5 MG PO TABS: ORAL_TABLET | ORAL | Status: DC

## 2015-03-20 MED ORDER — LIDOCAINE HCL (CARDIAC) 20 MG/ML IV SOLN
INTRAVENOUS | Status: AC
  Filled 2015-03-20: qty 10

## 2015-03-20 MED ORDER — PROPOFOL 10 MG/ML IV BOLUS
INTRAVENOUS | Status: DC | PRN
  Administered 2015-03-20: 30 mg via INTRAVENOUS

## 2015-03-20 MED ORDER — ONDANSETRON HCL 4 MG/2ML IJ SOLN
INTRAMUSCULAR | Status: DC | PRN
  Administered 2015-03-20: 4 mg via INTRAVENOUS

## 2015-03-20 SURGICAL SUPPLY — 40 items
ADH SKN CLS APL DERMABOND .7 (GAUZE/BANDAGES/DRESSINGS) ×2
ARMBAND PINK RESTRICT EXTREMIT (MISCELLANEOUS) ×4 IMPLANT
CANISTER SUCTION 2500CC (MISCELLANEOUS) ×4 IMPLANT
CANNULA VESSEL 3MM 2 BLNT TIP (CANNULA) ×4 IMPLANT
CLIP TI MEDIUM 6 (CLIP) ×4 IMPLANT
CLIP TI WIDE RED SMALL 6 (CLIP) ×4 IMPLANT
COVER PROBE W GEL 5X96 (DRAPES) IMPLANT
DECANTER SPIKE VIAL GLASS SM (MISCELLANEOUS) ×4 IMPLANT
DERMABOND ADVANCED (GAUZE/BANDAGES/DRESSINGS) ×2
DERMABOND ADVANCED .7 DNX12 (GAUZE/BANDAGES/DRESSINGS) IMPLANT
ELECT REM PT RETURN 9FT ADLT (ELECTROSURGICAL) ×4
ELECTRODE REM PT RTRN 9FT ADLT (ELECTROSURGICAL) ×2 IMPLANT
GAUZE SPONGE 4X4 16PLY XRAY LF (GAUZE/BANDAGES/DRESSINGS) ×2 IMPLANT
GLOVE BIO SURGEON STRL SZ 6.5 (GLOVE) ×2 IMPLANT
GLOVE BIO SURGEON STRL SZ7.5 (GLOVE) ×4 IMPLANT
GLOVE BIO SURGEON STRL SZ8 (GLOVE) ×2 IMPLANT
GLOVE BIO SURGEONS STRL SZ 6.5 (GLOVE) ×2
GLOVE BIOGEL PI IND STRL 6.5 (GLOVE) IMPLANT
GLOVE BIOGEL PI IND STRL 8 (GLOVE) ×2 IMPLANT
GLOVE BIOGEL PI INDICATOR 6.5 (GLOVE) ×6
GLOVE BIOGEL PI INDICATOR 8 (GLOVE) ×2
GLOVE SKINSENSE NS SZ7.0 (GLOVE) ×2
GLOVE SKINSENSE STRL SZ7.0 (GLOVE) IMPLANT
GOWN STRL REUS W/ TWL LRG LVL3 (GOWN DISPOSABLE) ×6 IMPLANT
GOWN STRL REUS W/TWL LRG LVL3 (GOWN DISPOSABLE) ×12
GRAFT GORETEX STRT 4-7X45 (Vascular Products) ×2 IMPLANT
KIT BASIN OR (CUSTOM PROCEDURE TRAY) ×4 IMPLANT
KIT ROOM TURNOVER OR (KITS) ×4 IMPLANT
LIQUID BAND (GAUZE/BANDAGES/DRESSINGS) ×4 IMPLANT
NS IRRIG 1000ML POUR BTL (IV SOLUTION) ×4 IMPLANT
PACK CV ACCESS (CUSTOM PROCEDURE TRAY) ×4 IMPLANT
PAD ARMBOARD 7.5X6 YLW CONV (MISCELLANEOUS) ×8 IMPLANT
SPONGE SURGIFOAM ABS GEL 100 (HEMOSTASIS) ×2 IMPLANT
SUT PROLENE 6 0 BV (SUTURE) ×2 IMPLANT
SUT PROLENE 6 0 CC (SUTURE) ×12 IMPLANT
SUT VIC AB 3-0 SH 27 (SUTURE) ×8
SUT VIC AB 3-0 SH 27X BRD (SUTURE) ×2 IMPLANT
SUT VICRYL 4-0 PS2 18IN ABS (SUTURE) ×6 IMPLANT
UNDERPAD 30X30 INCONTINENT (UNDERPADS AND DIAPERS) ×4 IMPLANT
WATER STERILE IRR 1000ML POUR (IV SOLUTION) ×4 IMPLANT

## 2015-03-20 NOTE — Anesthesia Preprocedure Evaluation (Addendum)
Anesthesia Evaluation  Patient identified by MRN, date of birth, ID band Patient awake    Reviewed: Allergy & Precautions, H&P , NPO status , Patient's Chart, lab work & pertinent test results  History of Anesthesia Complications (+) PONV, Family history of anesthesia reaction and history of anesthetic complications  Airway Mallampati: III       Dental  (+) Teeth Intact   Pulmonary shortness of breath and at rest,  + rhonchi   + decreased breath sounds      Cardiovascular hypertension, Pt. on home beta blockers and Pt. on medications +CHF + dysrhythmias Atrial Fibrillation Rhythm:Regular  Echo 11/2014 - Left ventricle: The cavity size was normal. Wall thickness wasincreased in a pattern of moderate LVH. Systolic function wasnormal. The estimated ejection fraction was in the range of 55%to 60%. Wall motion was normal; there were no regional wallmotion abnormalities. Doppler parameters are consistent withabnormal left ventricular relaxation (grade 1 diastolicdysfunction). - Mitral valve: Calcified annulus. Mildly thickened leaflets .    Neuro/Psych PSYCHIATRIC DISORDERS Anxiety  Neuromuscular disease    GI/Hepatic GERD-  ,  Endo/Other  diabetes, Type 2, Oral Hypoglycemic Agents, Insulin DependentMorbid obesity  Renal/GU CRFRenal disease     Musculoskeletal  (+) Arthritis -,   Abdominal (+) + obese,   Peds  Hematology  (+) anemia ,   Anesthesia Other Findings   Reproductive/Obstetrics                            Anesthesia Physical  Anesthesia Plan  ASA: III  Anesthesia Plan: MAC   Post-op Pain Management:    Induction: Intravenous  Airway Management Planned: Simple Face Mask and Nasal Cannula  Additional Equipment:   Intra-op Plan:   Post-operative Plan:   Informed Consent: I have reviewed the patients History and Physical, chart, labs and discussed the procedure  including the risks, benefits and alternatives for the proposed anesthesia with the patient or authorized representative who has indicated his/her understanding and acceptance.   Dental advisory given  Plan Discussed with: CRNA  Anesthesia Plan Comments:         Anesthesia Quick Evaluation

## 2015-03-20 NOTE — Transfer of Care (Signed)
Immediate Anesthesia Transfer of Care Note  Patient: [name] James  Procedure(s) Performed: Procedure(s):  Inserton of Left Upper Arm Gortex Graft (Left) LIGATION OF ARTERIOVENOUS  FISTULA (Left)  Patient Location: PACU  Anesthesia Type:MAC  Level of Consciousness: awake, alert , oriented and patient cooperative  Airway & Oxygen Therapy: Patient Spontanous Breathing and Patient connected to nasal cannula oxygen  Post-op Assessment: Report given to RN, Post -op Vital signs reviewed and stable and Patient moving all extremities  Post vital signs: Reviewed and stable  Complications: No apparent anesthesia complications

## 2015-03-20 NOTE — Anesthesia Postprocedure Evaluation (Signed)
Anesthesia Post Note  Patient: [name] James  Procedure(s) Performed: Procedure(s) (LRB):  Inserton of Left Upper Arm Gortex Graft (Left) LIGATION OF ARTERIOVENOUS  FISTULA (Left)  Anesthesia type: MAC  Patient location: PACU  Post pain: Pain level controlled  Post assessment: Post-op Vital signs reviewed  Last Vitals: BP 112/42 mmHg  Pulse 64  Temp(Src) 36.6 C  Resp 17  Ht 5' 4.5" (1.638 m)  Wt 205 lb (92.987 kg)  BMI 34.66 kg/m2  SpO2 100%  Post vital signs: Reviewed  Level of consciousness: awake  Complications: No apparent anesthesia complications

## 2015-03-20 NOTE — Op Note (Signed)
    NAME: [name] James   MRN: [id number] DOB: 1936-03-06    DATE OF OPERATION: 03/20/2015  PREOP DIAGNOSIS: stage V chronic kidney disease  POSTOP DIAGNOSIS: same  PROCEDURE:  1. left forearm AV graft  2. Ligation of left radiocephalic AV fistula  SURGEON: Judeth Cornfield. Scot Dock, MD, FACS  ASSIST: Leontine Locket, PA  ANESTHESIA: local with sedation   EBL: minimal  INDICATIONS: [name] James is a 79 y.o. female who had a non-maturing left radiocephalic AV fistula. Fistulogram showed that the basilic vein and upper arm cephalic vein were also quite small. This was confirmed by duplex.  FINDINGS: 4.5 mm brachial vein at the antecubital level. I placed an forearm graft.  TECHNIQUE: The patient was taken to the operating room and sedated by anesthesia. The left upper extremity was prepped and draped in usual sterile fashion. After the skin was anesthetized with 1% lidocaine, a longitudinal incision was made just above the antecubital level. The brachial artery was dissected free. The adjacent brachial vein was dissected free and was about 4.5 mm vein. Using one distal counter incision after the skin was anesthetized a 4-7 mm PTFE graft was tunneled in a loop fashion in the forearm with the arterial aspect of the graft along the ulnar aspect of the forearm and the venous aspect of the graft along the radial aspect of the forearm. The patient was heparinized. The brachial artery was clamped proximally and distally and a longitudinal arteriotomy was made. A segment of the 4 mm end of the graft was excised, the graft slightly spatulated and sewn into side to the artery using continuous 6-0 Prolene suture.  Then pulled the appropriate length for anastomosis to the brachial vein. The vein was ligated distally and spatulated proximal. The graft was cut to appropriate length, spatulated and sewn end to end to the vein using continuous 6-0 Prolene suture. The vein was very thin and the completion  there was a small tear in the vein which was repaired with 6-0 Prolene suture. At the completion was a good thrill in the graft. There was a radial and ulnar signal with the Doppler. Hemostasis was obtained the wounds. The wounds were closed with deeper 3-0 Vicryl and the skin closed with 4-0 Vicryl.  Next attention was turned to ligation of the left radiocephalic AV fistula. After the skin was anesthetized, a small incision was made over the fistula and the fistula was dissected free and ligated and divided between 3-0 silk ties. This wound was closed with 4-0 Vicryl. Dermabond was applied. The patient tolerated the procedure well and transferred to the recovery room in stable condition. All needle and sponge counts were correct.  [name], MD, FACS [facility]  DATE OF DICTATION:   03/20/2015

## 2015-03-20 NOTE — Discharge Instructions (Signed)
° ° °  03/20/2015 [name] James [id number] [birth year]  Surgeon(s): Angelia Mould, MD  Procedure(s):  Inserton of Left Forearm Gortex Graft LIGATION OF ARTERIOVENOUS  FISTULA  x Do not stick graft for 12 weeks

## 2015-03-20 NOTE — H&P (Signed)
   Patient name: [name] Schuller CoghillMRN: [id number] DOB: 1937/02/12Sex: female  REASON FOR VISIT: Follow up after left radiocephalic AV fistula and placement of tunneled dialysis catheter.  HPI: [name] James is a 79 y.o. female who had a left radiocephalic AV fistula placed and a tunneled dialysis catheter placed on 11/30/2014. She dialyzes Monday Wednesdays and Fridays at noon. She has no specific complaints and denies pain or paresthesias in the left upper extremity. Her catheter has been working well.  REVIEW OF SYSTEMS: Valu.Nieves ] denotes positive finding; [ ]  denotes negative finding  CARDIOVASCULAR: [ ]  chest pain [ ]  dyspnea on exertion  CONSTITUTIONAL: [ ]  fever [ ]  chills  PHYSICAL EXAM: Filed Vitals:   02/01/15 1200  BP: 84/51  Pulse: 70  Temp: 98.4 F (36.9 C)  TempSrc: Oral  Resp: 14  Height: 5\' 4"  (1.626 m)  Weight: 204 lb (92.534 kg)  SpO2: 99%   Body mass index is 35 kg/(m^2). GENERAL: The patient is a well-nourished female, in no acute distress. The vital signs are documented above. CARDIOVASCULAR: There is a regular rate and rhythm. PULMONARY: There is good air exchange bilaterally without wheezing or rales. Her incision at the left wrist is healing nicely. The fistula is pulsatile with the wrist and then is more difficult to follow in the mid forearm.  I have independently interpreted her duplex of her fistula. This shows that the diameters of her fistula ranged from 0.37 cm-0.57 cm. There is a branch in the distal forearm. There is also an area of stenosis adjacent to this area.  MEDICAL ISSUES:  The patient presents for new hemodialysis access. The plan will be for a left AV fistula or left AV graft. I discussed the procedure and potential, occasions with the patient and they are agreeable to proceed.  [place] [facility] [place] [facility]: [phone]

## 2015-03-20 NOTE — Anesthesia Procedure Notes (Signed)
Procedure Name: MAC Date/Time: 03/20/2015 1:10 PM Performed by: Willeen Cass P Pre-anesthesia Checklist: Patient identified, Timeout performed, Emergency Drugs available, Suction available and Patient being monitored Patient Re-evaluated:Patient Re-evaluated prior to inductionOxygen Delivery Method: Simple face mask Intubation Type: IV induction Dental Injury: Teeth and Oropharynx as per pre-operative assessment

## 2015-03-21 ENCOUNTER — Other Ambulatory Visit: Payer: Self-pay

## 2015-03-21 ENCOUNTER — Encounter: Payer: Self-pay | Admitting: *Deleted

## 2015-03-21 ENCOUNTER — Telehealth: Payer: Self-pay | Admitting: Vascular Surgery

## 2015-03-21 DIAGNOSIS — E611 Iron deficiency: Secondary | ICD-10-CM | POA: Diagnosis not present

## 2015-03-21 DIAGNOSIS — D509 Iron deficiency anemia, unspecified: Secondary | ICD-10-CM | POA: Diagnosis not present

## 2015-03-21 DIAGNOSIS — N186 End stage renal disease: Secondary | ICD-10-CM | POA: Diagnosis not present

## 2015-03-21 DIAGNOSIS — D631 Anemia in chronic kidney disease: Secondary | ICD-10-CM | POA: Diagnosis not present

## 2015-03-21 DIAGNOSIS — Z23 Encounter for immunization: Secondary | ICD-10-CM | POA: Diagnosis not present

## 2015-03-21 DIAGNOSIS — E1122 Type 2 diabetes mellitus with diabetic chronic kidney disease: Secondary | ICD-10-CM | POA: Diagnosis not present

## 2015-03-21 MED ORDER — OXYCODONE HCL 5 MG PO TABS: ORAL_TABLET | ORAL | Status: DC

## 2015-03-21 NOTE — Telephone Encounter (Signed)
Rx faxed to [facility] @ [phone], phone number [phone]  

## 2015-03-21 NOTE — Telephone Encounter (Signed)
Left message for patients caregiver about appt, asked for call back to confirm, dpm

## 2015-03-21 NOTE — Telephone Encounter (Signed)
-----   Message from Mena Goes, RN sent at 03/20/2015  3:35 PM EDT ----- Regarding: Schedule   ----- Message -----    From: Angelia Mould, MD    Sent: 03/20/2015   3:13 PM      To: [facility] Charge Pool Subject: charge and f/u                                 PROCEDURE:  1. left forearm AV graft  2. Ligation of left radiocephalic AV fistula  SURGEON: Judeth Cornfield. Scot Dock, MD, FACS  ASSIST: Leontine Locket, PA  She will need a follow up visit in approximately 2 weeks for a wound check. Thank you. CD

## 2015-03-22 DIAGNOSIS — E1122 Type 2 diabetes mellitus with diabetic chronic kidney disease: Secondary | ICD-10-CM | POA: Diagnosis not present

## 2015-03-22 DIAGNOSIS — Z992 Dependence on renal dialysis: Secondary | ICD-10-CM | POA: Diagnosis not present

## 2015-03-22 DIAGNOSIS — N186 End stage renal disease: Secondary | ICD-10-CM | POA: Diagnosis not present

## 2015-03-23 DIAGNOSIS — E1122 Type 2 diabetes mellitus with diabetic chronic kidney disease: Secondary | ICD-10-CM | POA: Diagnosis not present

## 2015-03-23 DIAGNOSIS — E876 Hypokalemia: Secondary | ICD-10-CM | POA: Diagnosis not present

## 2015-03-23 DIAGNOSIS — Z23 Encounter for immunization: Secondary | ICD-10-CM | POA: Diagnosis not present

## 2015-03-23 DIAGNOSIS — D631 Anemia in chronic kidney disease: Secondary | ICD-10-CM | POA: Diagnosis not present

## 2015-03-23 DIAGNOSIS — N186 End stage renal disease: Secondary | ICD-10-CM | POA: Diagnosis not present

## 2015-03-23 DIAGNOSIS — A499 Bacterial infection, unspecified: Secondary | ICD-10-CM | POA: Diagnosis not present

## 2015-03-26 DIAGNOSIS — D631 Anemia in chronic kidney disease: Secondary | ICD-10-CM | POA: Diagnosis not present

## 2015-03-26 DIAGNOSIS — A499 Bacterial infection, unspecified: Secondary | ICD-10-CM | POA: Diagnosis not present

## 2015-03-26 DIAGNOSIS — E1122 Type 2 diabetes mellitus with diabetic chronic kidney disease: Secondary | ICD-10-CM | POA: Diagnosis not present

## 2015-03-26 DIAGNOSIS — E876 Hypokalemia: Secondary | ICD-10-CM | POA: Diagnosis not present

## 2015-03-26 DIAGNOSIS — N186 End stage renal disease: Secondary | ICD-10-CM | POA: Diagnosis not present

## 2015-03-26 DIAGNOSIS — Z23 Encounter for immunization: Secondary | ICD-10-CM | POA: Diagnosis not present

## 2015-03-26 NOTE — Telephone Encounter (Signed)
LM again for patient at home # LISTED to call back and confirm follow up appointment for 04/13 @ 230pm, dpm

## 2015-03-28 DIAGNOSIS — A499 Bacterial infection, unspecified: Secondary | ICD-10-CM | POA: Diagnosis not present

## 2015-03-28 DIAGNOSIS — Z23 Encounter for immunization: Secondary | ICD-10-CM | POA: Diagnosis not present

## 2015-03-28 DIAGNOSIS — E876 Hypokalemia: Secondary | ICD-10-CM | POA: Diagnosis not present

## 2015-03-28 DIAGNOSIS — N186 End stage renal disease: Secondary | ICD-10-CM | POA: Diagnosis not present

## 2015-03-28 DIAGNOSIS — D631 Anemia in chronic kidney disease: Secondary | ICD-10-CM | POA: Diagnosis not present

## 2015-03-28 DIAGNOSIS — E1122 Type 2 diabetes mellitus with diabetic chronic kidney disease: Secondary | ICD-10-CM | POA: Diagnosis not present

## 2015-03-28 NOTE — Telephone Encounter (Signed)
LM for pt on cell #, home # is now disconnected, dpm

## 2015-03-30 DIAGNOSIS — A499 Bacterial infection, unspecified: Secondary | ICD-10-CM | POA: Diagnosis not present

## 2015-03-30 DIAGNOSIS — D631 Anemia in chronic kidney disease: Secondary | ICD-10-CM | POA: Diagnosis not present

## 2015-03-30 DIAGNOSIS — E1122 Type 2 diabetes mellitus with diabetic chronic kidney disease: Secondary | ICD-10-CM | POA: Diagnosis not present

## 2015-03-30 DIAGNOSIS — E876 Hypokalemia: Secondary | ICD-10-CM | POA: Diagnosis not present

## 2015-03-30 DIAGNOSIS — Z23 Encounter for immunization: Secondary | ICD-10-CM | POA: Diagnosis not present

## 2015-03-30 DIAGNOSIS — N186 End stage renal disease: Secondary | ICD-10-CM | POA: Diagnosis not present

## 2015-03-30 IMAGING — CR DG CHEST 1V PORT
1 series · 1 of 1 positions shown · non-contrast
Comparison: Chest radiograph November 30, 2014

CLINICAL DATA: Irregular heart rate at dialysis today, hand tremor.
History CHF, renal insufficiency, breast cancer.

EXAM:
PORTABLE CHEST - 1 VIEW

[AP]
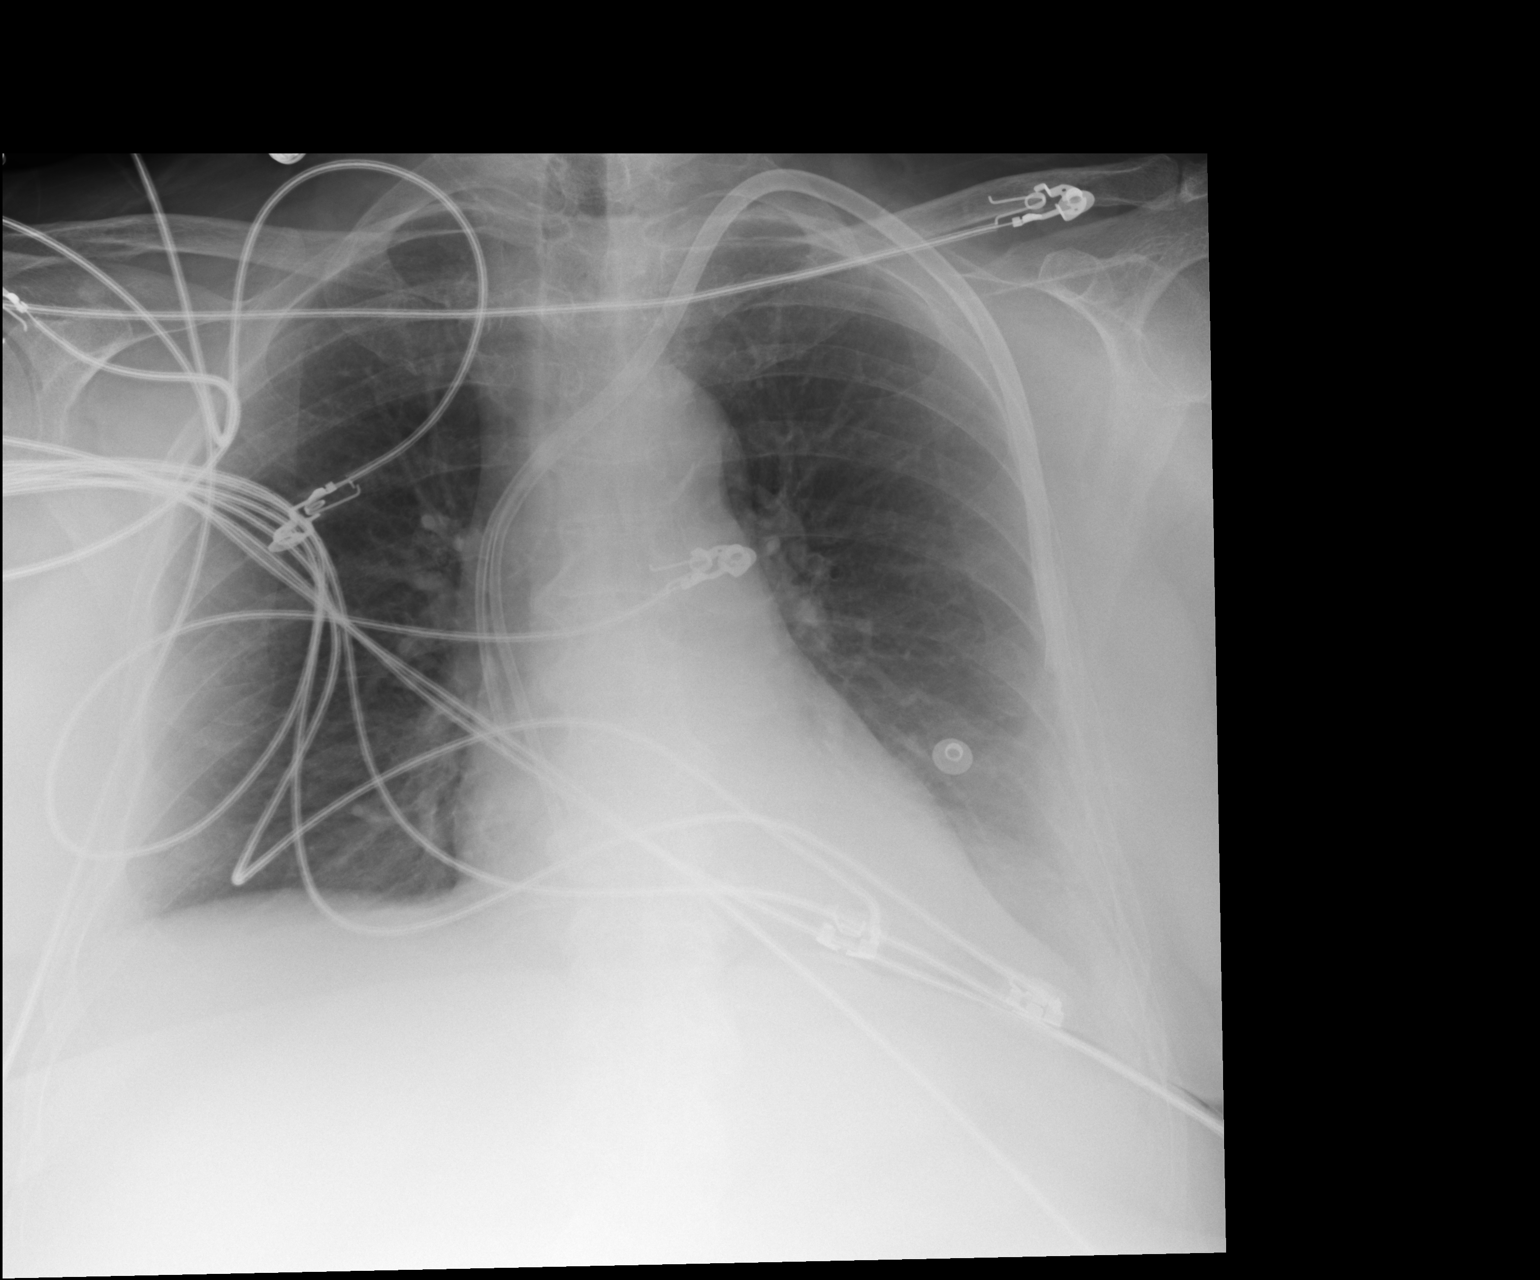

[1 of 1 positions shown; findings below may reference images not displayed]

FINDINGS: Cardiac silhouette is unremarkable and unchanged. Mildly calcified
aortic knob. Hazy density in the LEFT lung base. Tunneled dialysis
catheter via LEFT internal jugular venous approach with distal tips
in the cavoatrial junction, unchanged. No pneumothorax. Soft tissue
planes and included osseous structures are nonsuspicious. Interval
removal of RIGHT internal jugular intravenous line.
IMPRESSION: Hazy density in LEFT lung base may reflect small layering pleural
effusion, no frank pulmonary edema on today's examination.

  By: Yoel Tiger

## 2015-04-02 DIAGNOSIS — A499 Bacterial infection, unspecified: Secondary | ICD-10-CM | POA: Diagnosis not present

## 2015-04-02 DIAGNOSIS — D631 Anemia in chronic kidney disease: Secondary | ICD-10-CM | POA: Diagnosis not present

## 2015-04-02 DIAGNOSIS — N186 End stage renal disease: Secondary | ICD-10-CM | POA: Diagnosis not present

## 2015-04-02 DIAGNOSIS — Z23 Encounter for immunization: Secondary | ICD-10-CM | POA: Diagnosis not present

## 2015-04-02 DIAGNOSIS — E876 Hypokalemia: Secondary | ICD-10-CM | POA: Diagnosis not present

## 2015-04-02 DIAGNOSIS — E1122 Type 2 diabetes mellitus with diabetic chronic kidney disease: Secondary | ICD-10-CM | POA: Diagnosis not present

## 2015-04-02 NOTE — Telephone Encounter (Signed)
I left a 3rd message and still have not heard from patient- closing this encounter, dpm

## 2015-04-03 ENCOUNTER — Encounter: Payer: Self-pay | Admitting: Internal Medicine

## 2015-04-03 ENCOUNTER — Ambulatory Visit (INDEPENDENT_AMBULATORY_CARE_PROVIDER_SITE_OTHER): Payer: Medicare Other | Admitting: Internal Medicine

## 2015-04-03 ENCOUNTER — Encounter: Payer: Self-pay | Admitting: Vascular Surgery

## 2015-04-03 VITALS — BP 110/50 | HR 68

## 2015-04-03 DIAGNOSIS — D649 Anemia, unspecified: Secondary | ICD-10-CM | POA: Diagnosis not present

## 2015-04-03 DIAGNOSIS — R112 Nausea with vomiting, unspecified: Secondary | ICD-10-CM

## 2015-04-03 DIAGNOSIS — R194 Change in bowel habit: Secondary | ICD-10-CM

## 2015-04-03 DIAGNOSIS — R198 Other specified symptoms and signs involving the digestive system and abdomen: Secondary | ICD-10-CM

## 2015-04-03 DIAGNOSIS — R195 Other fecal abnormalities: Secondary | ICD-10-CM

## 2015-04-03 DIAGNOSIS — R109 Unspecified abdominal pain: Secondary | ICD-10-CM

## 2015-04-03 DIAGNOSIS — R6881 Early satiety: Secondary | ICD-10-CM

## 2015-04-03 DIAGNOSIS — N186 End stage renal disease: Secondary | ICD-10-CM

## 2015-04-03 DIAGNOSIS — Z992 Dependence on renal dialysis: Secondary | ICD-10-CM

## 2015-04-03 MED ORDER — PANTOPRAZOLE SODIUM 40 MG PO TBEC: 40.0000 mg | DELAYED_RELEASE_TABLET | Freq: Two times a day (BID) | ORAL | Status: DC

## 2015-04-03 MED ORDER — DICYCLOMINE HCL 20 MG PO TABS: 20.0000 mg | ORAL_TABLET | Freq: Three times a day (TID) | ORAL | Status: DC | PRN

## 2015-04-03 NOTE — Progress Notes (Addendum)
Subjective:    Patient ID: [name] James, female    DOB: 10-21-1936, 79 y.o.   MRN: [id number]  HPI [name] James is a 79 year old female seen initially by me in consultation during hospitalization in November 2015 with a past medical history of GERD, gastritis/duodenitis, FOBT positive normocytic anemia, Barrett's esophagus, COPD, diabetes, CHF, end-stage renal disease on dialysis and remote breast cancer who is seen for follow-up. She was last seen by Alonza Bogus, PA-C in January 2016. At that time she was continuing to have near constant nausea with intermittent vomiting along with mid and lower abdominal pain. CT scan of the abdomen and pelvis was performed which was largely unremarkable. Protonix was increased to twice a day and Carafate was added 4 times daily. Both she and her daughter state Carafate helped her tremendously. Her nausea and abdominal pain resolved entirely. Her appetite improved and she was able to eat. She was not needing anti-nausea medications. This medication was then stopped by Dr. [name] her follow just due to concern of elevated aluminum. Since stopping the medication her symptoms have returned.  She is having at times pain after eating. Early satiety. Nausea has returned with rare vomiting. Bowel movements vary between loose to constipated. She can occasionally have black stool but this is less common now. She is getting IV iron at dialysis and no longer taking oral iron replacement. She has Imodium as well as Senokot and MiraLAX on her med list and she says she gets all of these medications from time to time depending on her bowel habits. Overall strength is improving but she remains in nursing facility but is starting to bear weight and walk around on occasion.  Review of Systems As per history of present illness, otherwise negative  Current Medications, Allergies, Past Medical History, Past Surgical History, Family History and Social History were reviewed in  [employer] record.     Objective:   Physical Exam BP 110/50 mmHg  Pulse 68  Ht   Wt  Constitutional: Well-developed and well-nourished. No distress. HEENT: Normocephalic and atraumatic. Conjunctivae are normal.  No scleral icterus. Oxygen by nasal cannula in place Neck: Neck supple. Trachea midline. Cardiovascular: Normal rate, regular rhythm and intact distal pulses.  Pulmonary/chest: Effort normal and breath sounds normal. No wheezing, rales or rhonchi. Abdominal: Soft, obese, nontender, nondistended. Bowel sounds active throughout. Extremities: no clubbing, cyanosis, 1+ edema Neurological: Alert and oriented to person place and time. Skin: ulceration with eschar left nare Psychiatric: Normal mood and affect. Behavior is normal.  CBC    Component Value Date/Time   WBC 9.1 12/22/2014 0430   WBC 11.6* 04/02/2014 0908   WBC 13.8* 09/06/2008 1552   RBC 3.64* 12/22/2014 0430   RBC 3.28* 10/17/2014 2256   RBC 3.99* 04/02/2014 0908   RBC 4.03 09/06/2008 1552   HGB 10.9* 03/20/2015 1112   HGB 11.4* 04/02/2014 0908   HGB 13.0 09/06/2008 1552   HCT 32.0* 03/20/2015 1112   HCT 38.1 04/02/2014 0908   HCT 38.3 09/06/2008 1552   PLT 197 12/22/2014 0430   PLT 188 09/06/2008 1552   MCV 97.0 12/22/2014 0430   MCV 95.6 04/02/2014 0908   MCV 95.0 09/06/2008 1552   MCH 28.6 12/22/2014 0430   MCH 28.6 04/02/2014 0908   MCH 32.2 09/06/2008 1552   MCHC 29.5* 12/22/2014 0430   MCHC 29.9* 04/02/2014 0908   MCHC 33.9 09/06/2008 1552   RDW 16.3* 12/22/2014 0430   RDW 15.3* 09/06/2008 1552  LYMPHSABS 3.7 12/18/2014 1918   LYMPHSABS 4.2* 09/06/2008 1552   MONOABS 0.9 12/18/2014 1918   MONOABS 0.7 09/06/2008 1552   EOSABS 0.2 12/18/2014 1918   EOSABS 0.2 09/06/2008 1552   BASOSABS 0.0 12/18/2014 1918   BASOSABS 0.0 09/06/2008 1552    Iron/TIBC/Ferritin/ %Sat    Component Value Date/Time   IRON 25* 10/27/2014 0435   TIBC 328 10/27/2014 0435   FERRITIN 29  10/27/2014 0435   IRONPCTSAT 8* 10/27/2014 0435   IRONPCTSAT 22 09/06/2008 1552      Assessment & Plan:  79 year old female seen initially by me in consultation during hospitalization in November 2015 with a past medical history of GERD, gastritis/duodenitis, FOBT positive normocytic anemia, Barrett's esophagus, COPD, diabetes, CHF, end-stage renal disease on dialysis and remote breast cancer who is seen for follow-up. She was last seen by Alonza Bogus, PA-C in January 2016.  1. Abd pain/early satiety/heme + stools/anemia/nausea -- she had gastroduodenitis at the time of her endoscopy in November 2015. She's been on daily PPI and Carafate helped her symptoms tremendously. I will contact Dr. [name] to see if there are any options on restarting this medication. If not then I will increase her PPI to twice daily for maximal acid control. She may benefit from low-dose Reglan therapy to help with possibly an element of gastroparesis but also nausea. This can help with dyspepsia as well as. Discussed this medication at length including the risks of neurologic complications. Given limitations of all medical therapies for her, his drug may be an option at low dose. She would be willing to try this if Carafate is not an option. Levsin 0.125 every 6 hours as needed for cramping abdominal pain. --Heme positive stool -- we discussed colonoscopy to complete the evaluation of heme positive stool and anemia but at this time she remains too weak to undergo the procedure and preparation. Both she and her daughter agree with this. We discussed follow-up  2. Constipation alternating with loose stools -- I've asked that she avoid antidiarrheals and laxatives because this can confuse the picture. Try to maintain a consistent diet. See #1  Return in 8-12 weeks, sooner if necessary  Addendum --Levsin was not approved by insurance, therefore substituted Bentyl 20 mg 3 times daily as needed --Discussed sucralfate use  with Dr. [name], her nephrologist. Long-term risk of aluminum toxicity which may never be an issue for her. Both the patient and her daughter feel like this medicine helped her tremendously and after discussion with Dr. [name], in light of ongoing troublesome symptoms, he is okay resuming the medication 3 times daily before meals and at bedtime. Will reevaluate at follow-up. If no benefit after 3-4 weeks with Carafate than it should be discontinued

## 2015-04-03 NOTE — Patient Instructions (Addendum)
You have been scheduled to see Dr Hilarie Fredrickson in follow up on Tuesday, 06/05/15 @ 1:45pm  We have given you a prescription for the following: Pantoprazole 40 mg twice daily (increase from once daily dosing) Dicyclomine 20 mg three times daily as needed  Dr Hilarie Fredrickson will be in contact with your kidney doctor about carafate.

## 2015-04-04 ENCOUNTER — Encounter: Payer: Medicare Other | Admitting: Vascular Surgery

## 2015-04-04 DIAGNOSIS — Z23 Encounter for immunization: Secondary | ICD-10-CM | POA: Diagnosis not present

## 2015-04-04 DIAGNOSIS — E1122 Type 2 diabetes mellitus with diabetic chronic kidney disease: Secondary | ICD-10-CM | POA: Diagnosis not present

## 2015-04-04 DIAGNOSIS — E876 Hypokalemia: Secondary | ICD-10-CM | POA: Diagnosis not present

## 2015-04-04 DIAGNOSIS — D631 Anemia in chronic kidney disease: Secondary | ICD-10-CM | POA: Diagnosis not present

## 2015-04-04 DIAGNOSIS — A499 Bacterial infection, unspecified: Secondary | ICD-10-CM | POA: Diagnosis not present

## 2015-04-04 DIAGNOSIS — N186 End stage renal disease: Secondary | ICD-10-CM | POA: Diagnosis not present

## 2015-04-06 ENCOUNTER — Other Ambulatory Visit: Payer: Self-pay

## 2015-04-06 DIAGNOSIS — E876 Hypokalemia: Secondary | ICD-10-CM | POA: Diagnosis not present

## 2015-04-06 DIAGNOSIS — A499 Bacterial infection, unspecified: Secondary | ICD-10-CM | POA: Diagnosis not present

## 2015-04-06 DIAGNOSIS — D631 Anemia in chronic kidney disease: Secondary | ICD-10-CM | POA: Diagnosis not present

## 2015-04-06 DIAGNOSIS — Z23 Encounter for immunization: Secondary | ICD-10-CM | POA: Diagnosis not present

## 2015-04-06 DIAGNOSIS — N186 End stage renal disease: Secondary | ICD-10-CM | POA: Diagnosis not present

## 2015-04-06 DIAGNOSIS — E1122 Type 2 diabetes mellitus with diabetic chronic kidney disease: Secondary | ICD-10-CM | POA: Diagnosis not present

## 2015-04-06 MED ORDER — DIAZEPAM 5 MG PO TABS: ORAL_TABLET | ORAL | Status: DC

## 2015-04-06 NOTE — Telephone Encounter (Signed)
Rx faxed to [facility] @ [phone], phone number [phone]  

## 2015-04-09 ENCOUNTER — Encounter: Payer: Self-pay | Admitting: Vascular Surgery

## 2015-04-09 ENCOUNTER — Telehealth: Payer: Self-pay | Admitting: *Deleted

## 2015-04-09 DIAGNOSIS — A499 Bacterial infection, unspecified: Secondary | ICD-10-CM | POA: Diagnosis not present

## 2015-04-09 DIAGNOSIS — N186 End stage renal disease: Secondary | ICD-10-CM | POA: Diagnosis not present

## 2015-04-09 DIAGNOSIS — D631 Anemia in chronic kidney disease: Secondary | ICD-10-CM | POA: Diagnosis not present

## 2015-04-09 DIAGNOSIS — E876 Hypokalemia: Secondary | ICD-10-CM | POA: Diagnosis not present

## 2015-04-09 DIAGNOSIS — E1122 Type 2 diabetes mellitus with diabetic chronic kidney disease: Secondary | ICD-10-CM | POA: Diagnosis not present

## 2015-04-09 DIAGNOSIS — Z23 Encounter for immunization: Secondary | ICD-10-CM | POA: Diagnosis not present

## 2015-04-09 NOTE — Telephone Encounter (Signed)
Left message on home and mobile phone numbers for patient to call back.  ===View-only below this line===  ----- Message -----    From: Jerene Bears, MD    Sent: 04/09/2015   4:07 PM      To: Larina Bras, CMA  Please contact the patient and/or her daughter to let her know I spoke to Dr. [name] and he is okay resuming Carafate. We need to monitor for response, if this is not helping after 1 month it should be discontinued. Hopefully it will work as well as it did before and I will follow-up with her as discussed at last visit Resume Carafate as previously prescribed 1 g before meals and at bedtime

## 2015-04-10 ENCOUNTER — Ambulatory Visit (INDEPENDENT_AMBULATORY_CARE_PROVIDER_SITE_OTHER): Payer: Self-pay | Admitting: Family

## 2015-04-10 ENCOUNTER — Encounter: Payer: Self-pay | Admitting: Family

## 2015-04-10 ENCOUNTER — Other Ambulatory Visit: Payer: Self-pay | Admitting: Dermatology

## 2015-04-10 ENCOUNTER — Encounter: Payer: Self-pay | Admitting: Vascular Surgery

## 2015-04-10 VITALS — BP 103/58 | HR 64 | Temp 97.4°F | Resp 16 | Ht 64.5 in | Wt 205.0 lb

## 2015-04-10 DIAGNOSIS — C44311 Basal cell carcinoma of skin of nose: Secondary | ICD-10-CM | POA: Diagnosis not present

## 2015-04-10 DIAGNOSIS — M7989 Other specified soft tissue disorders: Secondary | ICD-10-CM | POA: Insufficient documentation

## 2015-04-10 DIAGNOSIS — Z992 Dependence on renal dialysis: Secondary | ICD-10-CM

## 2015-04-10 DIAGNOSIS — T82868A Thrombosis of vascular prosthetic devices, implants and grafts, initial encounter: Secondary | ICD-10-CM

## 2015-04-10 DIAGNOSIS — N186 End stage renal disease: Secondary | ICD-10-CM

## 2015-04-10 DIAGNOSIS — T8386XA Thrombosis of genitourinary prosthetic devices, implants and grafts, initial encounter: Secondary | ICD-10-CM

## 2015-04-10 NOTE — Progress Notes (Signed)
    Postoperative Access Visit   History of Present Illness  [name] James is a 79 y.o. year old female patient of Dr. Scot Dock who is s/p left radiocephalic AV fistula placed and a tunneled dialysis catheter placed on 11/30/2014. She dialyzes Monday Wednesdays and Fridays at noon.  Her catheter has been working well. She is also s/p ligation of left arm AVF and insertion of left forearm AV graft by Dr. Scot Dock on 03-20-15. Patient returns today, referred by her HD center, with C/O left arm swelling for 2 weeks, not improved, no worse.   She has been elevating her left arm.   The patient's left arm incisions are healing.  The patient notes denies left hand/forearm steal symptoms.  The patient is able to complete their activities of daily living.   The patient is right hand dominant.   For VQI Use Only  PRE-ADM LIVING: Nursing home  AMB STATUS: Wheelchair  Physical Examination Filed Vitals:   04/10/15 1534  BP: 103/58  Pulse: 64  Temp: 97.4 F (36.3 C)  TempSrc: Oral  Resp: 16  Height: 5' 4.5" (1.638 m)  Weight: 205 lb (92.987 kg)  SpO2: 100%   Body mass index is 34.66 kg/(m^2).  Left forearm: Incision is healing, no erythema, skin feels warm, hand grip is 4/5, sensation in digits is intact, no palpable thrill, bruit can not be auscultated in left forearm AVG.   Medical Decision Making  [name] James is a 79 y.o. year old female who presents s/p ligation of left arm AVF and insertion of left forearm AV graft on 03-20-15. There is no audible bruit, no palpable thrill in the left forearm AV graft, mild/moderate swelling present with no steal symptoms, palpable left radial pulse. Dr. Donnetta Hutching spoke with patient and son and examined pt. Will schedule with Dr. Scot Dock for left forearm AV graft thrombectomy vs new left arm AV graft, no flow in left forearm AVG.   Leary Mcnulty, Sharmon Leyden, RN, MSN, FNP-C [facility] [employer] Office: [phone]  04/10/2015,  3:58 PM  Clinic MD: Early

## 2015-04-11 ENCOUNTER — Other Ambulatory Visit: Payer: Self-pay

## 2015-04-11 ENCOUNTER — Encounter (HOSPITAL_COMMUNITY): Payer: Self-pay | Admitting: *Deleted

## 2015-04-11 ENCOUNTER — Ambulatory Visit: Payer: Medicare Other | Admitting: Vascular Surgery

## 2015-04-11 DIAGNOSIS — A499 Bacterial infection, unspecified: Secondary | ICD-10-CM | POA: Diagnosis not present

## 2015-04-11 DIAGNOSIS — E876 Hypokalemia: Secondary | ICD-10-CM | POA: Diagnosis not present

## 2015-04-11 DIAGNOSIS — N186 End stage renal disease: Secondary | ICD-10-CM | POA: Diagnosis not present

## 2015-04-11 DIAGNOSIS — D631 Anemia in chronic kidney disease: Secondary | ICD-10-CM | POA: Diagnosis not present

## 2015-04-11 DIAGNOSIS — E1122 Type 2 diabetes mellitus with diabetic chronic kidney disease: Secondary | ICD-10-CM | POA: Diagnosis not present

## 2015-04-11 DIAGNOSIS — Z23 Encounter for immunization: Secondary | ICD-10-CM | POA: Diagnosis not present

## 2015-04-11 MED ORDER — SODIUM CHLORIDE 0.9 % IV SOLN
INTRAVENOUS | Status: DC
  Administered 2015-04-12: 08:00:00 via INTRAVENOUS

## 2015-04-11 MED ORDER — CHLORHEXIDINE GLUCONATE CLOTH 2 % EX PADS: 6.0000 | MEDICATED_PAD | Freq: Once | CUTANEOUS | Status: DC

## 2015-04-11 MED ORDER — VANCOMYCIN HCL IN DEXTROSE 1-5 GM/200ML-% IV SOLN: 1000.0000 mg | INTRAVENOUS | Status: DC

## 2015-04-11 NOTE — Progress Notes (Signed)
No pre-op orders in EPIC. Called [facility] and left message on nurse line that we needed pre-op orders

## 2015-04-11 NOTE — Progress Notes (Signed)
Spoke with [name] James, nurse for [name] James at [facility]. She verified medical history. I instructed [name] James that pt needs to be here at 5:30 AM, she states pt will be arriving via their transportation. Instructed her to have them bring her to Entrance A to the Admitting Office. I was giving [name] James the medications that pt needed to take before coming but she states that pt's Amiodarone, Aspirin and Metoprolol cannot be given that early, they are scheduled for 9 AM. Pt is able to take her Protonix, Oxycodone - if needed and Zofran - if needed. I also instructed [name] James that pt is not to have any diabetic medications in the AM. She voiced understanding.  Called and spoke with pt's son, [name] James. He will be meeting pt here at 5:30 AM.

## 2015-04-11 NOTE — Telephone Encounter (Signed)
Left message for patient to call back  

## 2015-04-12 ENCOUNTER — Encounter (HOSPITAL_COMMUNITY)
Admission: RE | Disposition: A | Discharge status: Home / Self Care | Payer: Medicare Other | Source: Ambulatory Visit | Attending: Vascular Surgery

## 2015-04-12 ENCOUNTER — Ambulatory Visit (HOSPITAL_COMMUNITY): Payer: Medicare Other | Admitting: Certified Registered Nurse Anesthetist

## 2015-04-12 ENCOUNTER — Telehealth: Payer: Self-pay

## 2015-04-12 ENCOUNTER — Ambulatory Visit (HOSPITAL_COMMUNITY)
Admission: RE | Admit: 2015-04-12 | Discharge: 2015-04-12 | Disposition: A | Discharge status: Home / Self Care | Payer: Medicare Other | Source: Ambulatory Visit | Attending: Vascular Surgery | Admitting: Vascular Surgery

## 2015-04-12 ENCOUNTER — Encounter (HOSPITAL_COMMUNITY): Payer: Self-pay | Admitting: Certified Registered Nurse Anesthetist

## 2015-04-12 DIAGNOSIS — N183 Chronic kidney disease, stage 3 (moderate): Secondary | ICD-10-CM | POA: Diagnosis not present

## 2015-04-12 DIAGNOSIS — Y929 Unspecified place or not applicable: Secondary | ICD-10-CM | POA: Insufficient documentation

## 2015-04-12 DIAGNOSIS — M1990 Unspecified osteoarthritis, unspecified site: Secondary | ICD-10-CM | POA: Diagnosis not present

## 2015-04-12 DIAGNOSIS — Z992 Dependence on renal dialysis: Secondary | ICD-10-CM | POA: Diagnosis not present

## 2015-04-12 DIAGNOSIS — N185 Chronic kidney disease, stage 5: Secondary | ICD-10-CM | POA: Diagnosis not present

## 2015-04-12 DIAGNOSIS — T82511A Breakdown (mechanical) of surgically created arteriovenous shunt, initial encounter: Secondary | ICD-10-CM | POA: Diagnosis not present

## 2015-04-12 DIAGNOSIS — Y832 Surgical operation with anastomosis, bypass or graft as the cause of abnormal reaction of the patient, or of later complication, without mention of misadventure at the time of the procedure: Secondary | ICD-10-CM | POA: Insufficient documentation

## 2015-04-12 DIAGNOSIS — T82898A Other specified complication of vascular prosthetic devices, implants and grafts, initial encounter: Secondary | ICD-10-CM | POA: Diagnosis not present

## 2015-04-12 DIAGNOSIS — G8918 Other acute postprocedural pain: Secondary | ICD-10-CM

## 2015-04-12 DIAGNOSIS — I120 Hypertensive chronic kidney disease with stage 5 chronic kidney disease or end stage renal disease: Secondary | ICD-10-CM | POA: Insufficient documentation

## 2015-04-12 DIAGNOSIS — E119 Type 2 diabetes mellitus without complications: Secondary | ICD-10-CM | POA: Insufficient documentation

## 2015-04-12 DIAGNOSIS — I509 Heart failure, unspecified: Secondary | ICD-10-CM | POA: Diagnosis not present

## 2015-04-12 HISTORY — PX: REMOVAL OF GRAFT: SHX6361

## 2015-04-12 HISTORY — PX: AV FISTULA PLACEMENT: SHX1204

## 2015-04-12 LAB — GLUCOSE, CAPILLARY: Glucose-Capillary: 135 mg/dL — ABNORMAL HIGH (ref 70–99)

## 2015-04-12 LAB — POCT I-STAT 4, (NA,K, GLUC, HGB,HCT)
Glucose, Bld: 158 mg/dL — ABNORMAL HIGH (ref 70–99)
HEMATOCRIT: 35 % — AB (ref 36.0–46.0)
HEMOGLOBIN: 11.9 g/dL — AB (ref 12.0–15.0)
Potassium: 3 mmol/L — ABNORMAL LOW (ref 3.5–5.1)
SODIUM: 141 mmol/L (ref 135–145)

## 2015-04-12 SURGERY — INSERTION OF ARTERIOVENOUS (AV) GORE-TEX GRAFT ARM
Anesthesia: General | Site: Arm Upper | Laterality: Left

## 2015-04-12 MED ORDER — PROPOFOL 10 MG/ML IV BOLUS
INTRAVENOUS | Status: DC | PRN
  Administered 2015-04-12: 140 mg via INTRAVENOUS

## 2015-04-12 MED ORDER — EPHEDRINE SULFATE 50 MG/ML IJ SOLN
INTRAMUSCULAR | Status: DC | PRN
  Administered 2015-04-12 (×4): 10 mg via INTRAVENOUS

## 2015-04-12 MED ORDER — OXYCODONE HCL 5 MG PO TABS: 5.0000 mg | ORAL_TABLET | ORAL | Status: DC | PRN

## 2015-04-12 MED ORDER — 0.9 % SODIUM CHLORIDE (POUR BTL) OPTIME
TOPICAL | Status: DC | PRN
  Administered 2015-04-12: 1000 mL

## 2015-04-12 MED ORDER — ONDANSETRON HCL 4 MG/2ML IJ SOLN
INTRAMUSCULAR | Status: AC
  Filled 2015-04-12: qty 2

## 2015-04-12 MED ORDER — HEPARIN SODIUM (PORCINE) 1000 UNIT/ML IJ SOLN
INTRAMUSCULAR | Status: DC | PRN
  Administered 2015-04-12: 7000 [IU] via INTRAVENOUS

## 2015-04-12 MED ORDER — LIDOCAINE-EPINEPHRINE (PF) 1 %-1:200000 IJ SOLN
INTRAMUSCULAR | Status: AC
  Filled 2015-04-12: qty 10

## 2015-04-12 MED ORDER — THROMBIN 20000 UNITS EX SOLR
CUTANEOUS | Status: AC
  Filled 2015-04-12: qty 20000

## 2015-04-12 MED ORDER — OXYCODONE HCL 5 MG PO TABS
ORAL_TABLET | ORAL | Status: DC
Start: 2015-04-12 — End: 2015-04-16

## 2015-04-12 MED ORDER — FENTANYL CITRATE (PF) 100 MCG/2ML IJ SOLN: 25.0000 ug | INTRAMUSCULAR | Status: DC | PRN

## 2015-04-12 MED ORDER — MIDAZOLAM HCL 5 MG/5ML IJ SOLN
INTRAMUSCULAR | Status: DC | PRN
  Administered 2015-04-12 (×2): 1 mg via INTRAVENOUS

## 2015-04-12 MED ORDER — LIDOCAINE HCL (PF) 1 % IJ SOLN
INTRAMUSCULAR | Status: AC
  Filled 2015-04-12: qty 30

## 2015-04-12 MED ORDER — VANCOMYCIN HCL IN DEXTROSE 1-5 GM/200ML-% IV SOLN
INTRAVENOUS | Status: AC
  Administered 2015-04-12: 1000 mg via INTRAVENOUS
  Filled 2015-04-12: qty 200

## 2015-04-12 MED ORDER — LIDOCAINE HCL (CARDIAC) 20 MG/ML IV SOLN
INTRAVENOUS | Status: DC | PRN
  Administered 2015-04-12: 50 mg via INTRAVENOUS

## 2015-04-12 MED ORDER — THROMBIN 20000 UNITS EX KIT
PACK | CUTANEOUS | Status: DC | PRN
  Administered 2015-04-12: 20 mL via TOPICAL

## 2015-04-12 MED ORDER — PHENYLEPHRINE 40 MCG/ML (10ML) SYRINGE FOR IV PUSH (FOR BLOOD PRESSURE SUPPORT)
PREFILLED_SYRINGE | INTRAVENOUS | Status: AC
  Filled 2015-04-12: qty 10

## 2015-04-12 MED ORDER — ONDANSETRON HCL 4 MG/2ML IJ SOLN
4.0000 mg | Freq: Once | INTRAMUSCULAR | Status: AC | PRN
  Administered 2015-04-12: 4 mg via INTRAVENOUS

## 2015-04-12 MED ORDER — PHENYLEPHRINE HCL 10 MG/ML IJ SOLN
INTRAMUSCULAR | Status: AC
  Filled 2015-04-12: qty 1

## 2015-04-12 MED ORDER — SODIUM CHLORIDE 0.9 % IR SOLN
Status: DC | PRN
  Administered 2015-04-12: 500 mL

## 2015-04-12 MED ORDER — FENTANYL CITRATE (PF) 100 MCG/2ML IJ SOLN
INTRAMUSCULAR | Status: DC | PRN
  Administered 2015-04-12 (×3): 25 ug via INTRAVENOUS

## 2015-04-12 MED ORDER — PHENYLEPHRINE HCL 10 MG/ML IJ SOLN
INTRAMUSCULAR | Status: DC | PRN
  Administered 2015-04-12 (×5): 40 ug via INTRAVENOUS
  Administered 2015-04-12: 80 ug via INTRAVENOUS
  Administered 2015-04-12: 40 ug via INTRAVENOUS

## 2015-04-12 MED ORDER — PROTAMINE SULFATE 10 MG/ML IV SOLN
INTRAVENOUS | Status: DC | PRN
  Administered 2015-04-12: 40 mg via INTRAVENOUS

## 2015-04-12 MED ORDER — ONDANSETRON HCL 4 MG/2ML IJ SOLN
INTRAMUSCULAR | Status: DC | PRN
  Administered 2015-04-12: 4 mg via INTRAVENOUS

## 2015-04-12 SURGICAL SUPPLY — 48 items
ARMBAND PINK RESTRICT EXTREMIT (MISCELLANEOUS) ×4 IMPLANT
BNDG CMPR 9X4 STRL LF SNTH (GAUZE/BANDAGES/DRESSINGS) ×2
BNDG ESMARK 4X9 LF (GAUZE/BANDAGES/DRESSINGS) ×3 IMPLANT
CANISTER SUCTION 2500CC (MISCELLANEOUS) ×4 IMPLANT
CANNULA VESSEL 3MM 2 BLNT TIP (CANNULA) ×3 IMPLANT
CATH EMB 4FR 80CM (CATHETERS) ×1 IMPLANT
CLIP TI MEDIUM 6 (CLIP) ×4 IMPLANT
CLIP TI WIDE RED SMALL 6 (CLIP) ×4 IMPLANT
CUFF TOURNIQUET SINGLE 24IN (TOURNIQUET CUFF) ×3 IMPLANT
DECANTER SPIKE VIAL GLASS SM (MISCELLANEOUS) ×4 IMPLANT
DRAPE X-RAY CASS 24X20 (DRAPES) IMPLANT
ELECT REM PT RETURN 9FT ADLT (ELECTROSURGICAL) ×4
ELECTRODE REM PT RTRN 9FT ADLT (ELECTROSURGICAL) ×2 IMPLANT
GAUZE SPONGE 4X4 16PLY XRAY LF (GAUZE/BANDAGES/DRESSINGS) IMPLANT
GEL ULTRASOUND 20GR AQUASONIC (MISCELLANEOUS) IMPLANT
GLOVE BIO SURGEON STRL SZ 6.5 (GLOVE) ×4 IMPLANT
GLOVE BIO SURGEON STRL SZ7.5 (GLOVE) ×4 IMPLANT
GLOVE BIO SURGEONS STRL SZ 6.5 (GLOVE) ×2
GLOVE BIOGEL PI IND STRL 6.5 (GLOVE) ×2 IMPLANT
GLOVE BIOGEL PI IND STRL 8 (GLOVE) ×2 IMPLANT
GLOVE BIOGEL PI IND STRL 8.5 (GLOVE) ×1 IMPLANT
GLOVE BIOGEL PI INDICATOR 6.5 (GLOVE) ×4
GLOVE BIOGEL PI INDICATOR 8 (GLOVE) ×2
GLOVE BIOGEL PI INDICATOR 8.5 (GLOVE) ×2
GLOVE SURG SS PI 7.0 STRL IVOR (GLOVE) ×3 IMPLANT
GLOVE SURG SS PI 8.5 STRL IVOR (GLOVE) ×2
GLOVE SURG SS PI 8.5 STRL STRW (GLOVE) ×1 IMPLANT
GOWN STRL REUS W/ TWL LRG LVL3 (GOWN DISPOSABLE) ×6 IMPLANT
GOWN STRL REUS W/ TWL XL LVL3 (GOWN DISPOSABLE) ×1 IMPLANT
GOWN STRL REUS W/TWL LRG LVL3 (GOWN DISPOSABLE) ×12
GOWN STRL REUS W/TWL XL LVL3 (GOWN DISPOSABLE) ×4
GRAFT GORETEX STRT 4-7X45 (Vascular Products) ×3 IMPLANT
KIT BASIN OR (CUSTOM PROCEDURE TRAY) ×4 IMPLANT
KIT ROOM TURNOVER OR (KITS) ×4 IMPLANT
LIQUID BAND (GAUZE/BANDAGES/DRESSINGS) ×4 IMPLANT
NS IRRIG 1000ML POUR BTL (IV SOLUTION) ×4 IMPLANT
PACK CV ACCESS (CUSTOM PROCEDURE TRAY) ×4 IMPLANT
PAD ARMBOARD 7.5X6 YLW CONV (MISCELLANEOUS) ×8 IMPLANT
SET COLLECT BLD 21X3/4 12 (NEEDLE) IMPLANT
SPONGE SURGIFOAM ABS GEL 100 (HEMOSTASIS) ×3 IMPLANT
STOPCOCK 4 WAY LG BORE MALE ST (IV SETS) IMPLANT
SUT PROLENE 6 0 BV (SUTURE) ×8 IMPLANT
SUT VIC AB 3-0 SH 27 (SUTURE) ×8
SUT VIC AB 3-0 SH 27X BRD (SUTURE) ×4 IMPLANT
SUT VICRYL 4-0 PS2 18IN ABS (SUTURE) ×8 IMPLANT
TUBING EXTENTION W/L.L. (IV SETS) IMPLANT
UNDERPAD 30X30 INCONTINENT (UNDERPADS AND DIAPERS) ×4 IMPLANT
WATER STERILE IRR 1000ML POUR (IV SOLUTION) ×4 IMPLANT

## 2015-04-12 NOTE — Interval H&P Note (Signed)
History and Physical Interval Note:  04/12/2015 7:24 AM  [name] James  has presented today for surgery, with the diagnosis of No flow in left forearm arteriovenous graft T82.511A  The various methods of treatment have been discussed with the patient and family. After consideration of risks, benefits and other options for treatment, the patient has consented to  Procedure(s): THROMBECTOMY ARTERIOVENOUS GORE-TEX GRAFT (Left) INSERTION OF ARTERIOVENOUS (AV) GORE-TEX GRAFT ARM (Left) as a surgical intervention .  The patient's history has been reviewed, patient examined, no change in status, stable for surgery.  I have reviewed the patient's chart and labs.  Questions were answered to the patient's satisfaction.     Onnie Alatorre S

## 2015-04-12 NOTE — Op Note (Signed)
    NAME: [name] James   MRN: [id number] DOB: 11/03/1936    DATE OF OPERATION: 04/12/2015  PREOP DIAGNOSIS: Stage V chronic kidney disease  POSTOP DIAGNOSIS: Same  PROCEDURE:  1. New left upper arm AV graft 2. Removal of old forearm graft  SURGEON: Judeth Cornfield. Scot Dock, MD, FACS  ASSIST: Leontine Locket, PA  ANESTHESIA: Gen.   EBL: minimal  INDICATIONS: [name] James is a 79 y.o. female had a previous left radiocephalic fistula which failed. She then had a forearm graft. The brachial vein was fairly small and this clotted fairly early. Therefore I elected to place a new left upper arm graft.  FINDINGS: 5 mm upper arm brachial vein.  TECHNIQUE: The patient was taken to the operating room and received a general anesthetic. The left upper extremity was prepped and draped in usual sterile fashion. The incision at the antecubital level was opened and the arterial and venous limbs of the graft dissected free. The graft was occluded. The arterial and venous limbs of the graft were clamped and divided. I tried to pull the graft through the antecubital incision but it was stuck enough that I had to open the distal incision and was able to completely remove the graft. At the venous and the outflow vein was small and this was ligated and the venous limb removed. The arterial and was stuck. I therefore elected to use a tourniquet.  A separate longitudinal incision was made beneath the axilla. Here the high brachial vein was dissected free. A 4-7 mm PTFE graft was tunneled between the 2 incisions. The tourniquet was then placed on the upper arm. The patient was heparinized. The arm was exsanguinated with an Esmarch bandage and the tourniquet inflated to 250 mmHg. Her tourniquet control, the old arterial limb of the graft was removed from the brachial artery. A short segment of the 4 mm end of the graft was excised, the graft slightly spatulated, and sewn end-to-side to the brachial artery using  continuous 6-0 Prolene suture. The tourniquet was then released and the graft clamped.  The graft was then pulled the appropriate length for anastomosis to the high brachial vein. The vein was ligated distally and spatulated proximally. This was a 5 mm vein. The new graft was sewn end-to-end into the vein using continuous 6-0 Prolene suture. At the completion of his excellent thrill in the graft and a good radial and ulnar signal with the Doppler. Heparin was partially reversed with protamine.  The distal forearm incision was closed with a deep layer of 3-0 Vicryl and skin closed with 4-0 Vicryl. The antecubital incision was closed with 2 deep layers of 3-0 Vicryl and the skin closed with 4-0 Vicryl. The axillary incision was closed with 2 deep layers of 3-0 Vicryl and the skin closed with 4-0 Vicryl. Dermabond was applied. The patient tolerated the procedure well and was transferred to the recovery room in stable condition. All needle and sponge counts were correct.  [name], MD, FACS [facility]  DATE OF DICTATION:   04/12/2015

## 2015-04-12 NOTE — Telephone Encounter (Signed)
Rec'd voice message from pt's daughter.  Stated the pt. had surgery on her arm today, and is in a lot of pain.  Reported she is residing at [facility] and receiving Oxycodone IR, 5 mg, 1/2 tab q 4 hrs/ prn.  The daughter stated she is in [place], presently, and not with pt. at this time.  Requested that stronger pain medication be ordered.  Call placed to [facility]; spoke with nurse, [place].  Reported that the pt. Verbalized she should be getting a full tablet of the Oxycodone, however the order is for 1/2 tab q 4 hr. / prn.  Call placed to S. Rhyne, PA.  Questioned if there is a reason for ordering the Oxycodone 5 mg, 1/2 tab q 4 hrs/ prn?  Stated this was the dose the pt. Came to the hospital on.  Gave verbal approval for Oxycodone IR 5 mg tab, p.o., q 4hr/ prn for severe pain.   [facility]; spoke with nurse, Ivin Booty.  Gave verbal order for Oxycodone IR 5mg  , 1 tab q 4hr./ prn for severe pain, #30; no refills.  Will fax a note to the nursing facility.  Spoke with pt's daughter, Kenney Houseman;  Advised of above change in pain medication dose.  Advised will need a family member to pick up a printed prescription and take to the nursing home, as a faxed prescription of this medication is not acceptable.  Stated she will have her brother pick this up in the AM.

## 2015-04-12 NOTE — Transfer of Care (Signed)
Immediate Anesthesia Transfer of Care Note  Patient: [name] James  Procedure(s) Performed: Procedure(s): INSERTION OF LEFT ARM  ARTERIOVENOUS GORE-TEX GRAFT  (Left) REMOVAL OF LEFT ARM ARTERIOVENOUS GORE-TEX GRAFT (Left)  Patient Location: PACU  Anesthesia Type:General  Level of Consciousness: awake, alert , patient cooperative and responds to stimulation  Airway & Oxygen Therapy: Patient Spontanous Breathing and Patient connected to nasal cannula oxygen  Post-op Assessment: Report given to RN, Post -op Vital signs reviewed and stable and Patient moving all extremities X 4  Post vital signs: Reviewed and stable  Last Vitals:  Filed Vitals:   04/12/15 0613  BP: 135/52  Pulse: 61  Temp: 123XX123 C    Complications: No apparent anesthesia complications

## 2015-04-12 NOTE — Progress Notes (Signed)
Pt refused sacral foam prophylaxis. Dr. Linna Caprice aware of K=3.0 and that pt did not receive Amiodarone and Lopressor this morning, no new orders received.

## 2015-04-12 NOTE — Anesthesia Preprocedure Evaluation (Signed)
Anesthesia Evaluation  Patient identified by MRN, date of birth, ID band Patient awake    Reviewed: Allergy & Precautions, NPO status , Patient's Chart, lab work & pertinent test results  Airway Mallampati: II  TM Distance: >3 FB Neck ROM: Full    Dental  (+) Partial Upper, Dental Advisory Given   Pulmonary  breath sounds clear to auscultation        Cardiovascular hypertension, Rhythm:Regular Rate:Normal     Neuro/Psych    GI/Hepatic   Endo/Other  diabetes  Renal/GU      Musculoskeletal   Abdominal   Peds  Hematology   Anesthesia Other Findings   Reproductive/Obstetrics                             Anesthesia Physical Anesthesia Plan  ASA: III  Anesthesia Plan: General   Post-op Pain Management:    Induction: Intravenous  Airway Management Planned: LMA  Additional Equipment:   Intra-op Plan:   Post-operative Plan:   Informed Consent: I have reviewed the patients History and Physical, chart, labs and discussed the procedure including the risks, benefits and alternatives for the proposed anesthesia with the patient or authorized representative who has indicated his/her understanding and acceptance.   Dental advisory given  Plan Discussed with: Anesthesiologist and CRNA  Anesthesia Plan Comments:         Anesthesia Quick Evaluation

## 2015-04-12 NOTE — Anesthesia Procedure Notes (Signed)
Procedure Name: LMA Insertion Date/Time: 04/12/2015 5:47 PM Performed by: Gershon Mussel, Tayron Hunnell Pre-anesthesia Checklist: Suction available, Patient being monitored, Emergency Drugs available, Patient identified and Timeout performed Patient Re-evaluated:Patient Re-evaluated prior to inductionOxygen Delivery Method: Circle system utilized Preoxygenation: Pre-oxygenation with 100% oxygen Intubation Type: IV induction LMA Size: 4.0 Placement Confirmation: positive ETCO2 and breath sounds checked- equal and bilateral Tube secured with: Tape Dental Injury: Teeth and Oropharynx as per pre-operative assessment

## 2015-04-12 NOTE — H&P (View-Only) (Signed)
    Postoperative Access Visit   History of Present Illness  [name] James is a 79 y.o. year old female patient of Dr. Scot Dock who is s/p left radiocephalic AV fistula placed and a tunneled dialysis catheter placed on 11/30/2014. She dialyzes Monday Wednesdays and Fridays at noon.  Her catheter has been working well. She is also s/p ligation of left arm AVF and insertion of left forearm AV graft by Dr. Scot Dock on 03-20-15. Patient returns today, referred by her HD center, with C/O left arm swelling for 2 weeks, not improved, no worse.   She has been elevating her left arm.   The patient's left arm incisions are healing.  The patient notes denies left hand/forearm steal symptoms.  The patient is able to complete their activities of daily living.   The patient is right hand dominant.   For VQI Use Only  PRE-ADM LIVING: Nursing home  AMB STATUS: Wheelchair  Physical Examination Filed Vitals:   04/10/15 1534  BP: 103/58  Pulse: 64  Temp: 97.4 F (36.3 C)  TempSrc: Oral  Resp: 16  Height: 5' 4.5" (1.638 m)  Weight: 205 lb (92.987 kg)  SpO2: 100%   Body mass index is 34.66 kg/(m^2).  Left forearm: Incision is healing, no erythema, skin feels warm, hand grip is 4/5, sensation in digits is intact, no palpable thrill, bruit can not be auscultated in left forearm AVG.   Medical Decision Making  [name] James is a 79 y.o. year old female who presents s/p ligation of left arm AVF and insertion of left forearm AV graft on 03-20-15. There is no audible bruit, no palpable thrill in the left forearm AV graft, mild/moderate swelling present with no steal symptoms, palpable left radial pulse. Dr. Donnetta Hutching spoke with patient and son and examined pt. Will schedule with Dr. Scot Dock for left forearm AV graft thrombectomy vs new left arm AV graft, no flow in left forearm AVG.   Korvin Valentine, Sharmon Leyden, RN, MSN, FNP-C [facility] Office: [phone]  04/10/2015,  3:58 PM  Clinic MD: Early

## 2015-04-12 NOTE — Anesthesia Postprocedure Evaluation (Signed)
  Anesthesia Post-op Note  Patient: [name] James  Procedure(s) Performed: Procedure(s): INSERTION OF LEFT ARM  ARTERIOVENOUS GORE-TEX GRAFT  (Left) REMOVAL OF LEFT ARM ARTERIOVENOUS GORE-TEX GRAFT (Left)  Patient Location: PACU  Anesthesia Type:General  Level of Consciousness: awake, alert , patient cooperative and responds to stimulation  Airway and Oxygen Therapy: Patient Spontanous Breathing and Patient connected to nasal cannula oxygen  Post-op Pain: none  Post-op Assessment: Post-op Vital signs reviewed, Patient's Cardiovascular Status Stable, Respiratory Function Stable, Patent Airway, No signs of Nausea or vomiting and Pain level controlled  Post-op Vital Signs: Reviewed and stable  Last Vitals:  Filed Vitals:   04/12/15 0613  BP: 135/52  Pulse: 61  Temp: 123XX123 C    Complications: No apparent anesthesia complications

## 2015-04-12 NOTE — Discharge Instructions (Signed)
° ° °  04/12/2015 MARIEKE ANNE [id number] Jan 22, 1936  Surgeon(s): Angelia Mould, MD  Procedure(s): INSERTION OF LEFT ARM  ARTERIOVENOUS GORE-TEX GRAFT  REMOVAL OF LEFT ARM ARTERIOVENOUS GORE-TEX GRAFT  x Do not stick graft for 4 weeks

## 2015-04-13 ENCOUNTER — Encounter (HOSPITAL_COMMUNITY): Payer: Self-pay | Admitting: Vascular Surgery

## 2015-04-13 ENCOUNTER — Other Ambulatory Visit: Payer: Self-pay | Admitting: *Deleted

## 2015-04-13 DIAGNOSIS — N186 End stage renal disease: Secondary | ICD-10-CM | POA: Diagnosis not present

## 2015-04-13 DIAGNOSIS — A499 Bacterial infection, unspecified: Secondary | ICD-10-CM | POA: Diagnosis not present

## 2015-04-13 DIAGNOSIS — Z23 Encounter for immunization: Secondary | ICD-10-CM | POA: Diagnosis not present

## 2015-04-13 DIAGNOSIS — D631 Anemia in chronic kidney disease: Secondary | ICD-10-CM | POA: Diagnosis not present

## 2015-04-13 DIAGNOSIS — E876 Hypokalemia: Secondary | ICD-10-CM | POA: Diagnosis not present

## 2015-04-13 DIAGNOSIS — G8918 Other acute postprocedural pain: Secondary | ICD-10-CM

## 2015-04-13 DIAGNOSIS — E1122 Type 2 diabetes mellitus with diabetic chronic kidney disease: Secondary | ICD-10-CM | POA: Diagnosis not present

## 2015-04-13 MED ORDER — OXYCODONE HCL 5 MG PO TABS: 5.0000 mg | ORAL_TABLET | ORAL | Status: DC | PRN

## 2015-04-13 MED FILL — Thrombin For Soln 20000 Unit: CUTANEOUS | Qty: 1 | Status: AC

## 2015-04-16 DIAGNOSIS — D631 Anemia in chronic kidney disease: Secondary | ICD-10-CM | POA: Diagnosis not present

## 2015-04-16 DIAGNOSIS — Z23 Encounter for immunization: Secondary | ICD-10-CM | POA: Diagnosis not present

## 2015-04-16 DIAGNOSIS — A499 Bacterial infection, unspecified: Secondary | ICD-10-CM | POA: Diagnosis not present

## 2015-04-16 DIAGNOSIS — E876 Hypokalemia: Secondary | ICD-10-CM | POA: Diagnosis not present

## 2015-04-16 DIAGNOSIS — E1122 Type 2 diabetes mellitus with diabetic chronic kidney disease: Secondary | ICD-10-CM | POA: Diagnosis not present

## 2015-04-16 DIAGNOSIS — N186 End stage renal disease: Secondary | ICD-10-CM | POA: Diagnosis not present

## 2015-04-16 MED ORDER — SUCRALFATE 1 G PO TABS: 1.0000 g | ORAL_TABLET | Freq: Three times a day (TID) | ORAL | Status: DC

## 2015-04-16 NOTE — Telephone Encounter (Signed)
I have spoken to patient's son, [name], and patient's daughter, Kenney Houseman to advise of information below. They verbalize understanding. I have also contacted [facility] where patient resides [phone]) and spoke to Kathlee Nations (nurse) to give verbal orders for Carafate 1 g AC and HS. She verbalizes understanding of this and states that their pharmacy will fill this rx.

## 2015-04-18 DIAGNOSIS — E1122 Type 2 diabetes mellitus with diabetic chronic kidney disease: Secondary | ICD-10-CM | POA: Diagnosis not present

## 2015-04-18 DIAGNOSIS — N186 End stage renal disease: Secondary | ICD-10-CM | POA: Diagnosis not present

## 2015-04-18 DIAGNOSIS — E876 Hypokalemia: Secondary | ICD-10-CM | POA: Diagnosis not present

## 2015-04-18 DIAGNOSIS — D631 Anemia in chronic kidney disease: Secondary | ICD-10-CM | POA: Diagnosis not present

## 2015-04-18 DIAGNOSIS — Z23 Encounter for immunization: Secondary | ICD-10-CM | POA: Diagnosis not present

## 2015-04-18 DIAGNOSIS — A499 Bacterial infection, unspecified: Secondary | ICD-10-CM | POA: Diagnosis not present

## 2015-04-20 DIAGNOSIS — E1122 Type 2 diabetes mellitus with diabetic chronic kidney disease: Secondary | ICD-10-CM | POA: Diagnosis not present

## 2015-04-20 DIAGNOSIS — E876 Hypokalemia: Secondary | ICD-10-CM | POA: Diagnosis not present

## 2015-04-20 DIAGNOSIS — Z23 Encounter for immunization: Secondary | ICD-10-CM | POA: Diagnosis not present

## 2015-04-20 DIAGNOSIS — D631 Anemia in chronic kidney disease: Secondary | ICD-10-CM | POA: Diagnosis not present

## 2015-04-20 DIAGNOSIS — A499 Bacterial infection, unspecified: Secondary | ICD-10-CM | POA: Diagnosis not present

## 2015-04-20 DIAGNOSIS — N186 End stage renal disease: Secondary | ICD-10-CM | POA: Diagnosis not present

## 2015-04-21 ENCOUNTER — Non-Acute Institutional Stay (SKILLED_NURSING_FACILITY): Payer: Medicare Other | Admitting: Adult Health

## 2015-04-21 ENCOUNTER — Encounter: Payer: Self-pay | Admitting: Adult Health

## 2015-04-21 DIAGNOSIS — G4700 Insomnia, unspecified: Secondary | ICD-10-CM

## 2015-04-21 DIAGNOSIS — I480 Paroxysmal atrial fibrillation: Secondary | ICD-10-CM | POA: Diagnosis not present

## 2015-04-21 DIAGNOSIS — K2970 Gastritis, unspecified, without bleeding: Secondary | ICD-10-CM | POA: Diagnosis not present

## 2015-04-21 DIAGNOSIS — E1122 Type 2 diabetes mellitus with diabetic chronic kidney disease: Secondary | ICD-10-CM | POA: Diagnosis not present

## 2015-04-21 DIAGNOSIS — I953 Hypotension of hemodialysis: Secondary | ICD-10-CM

## 2015-04-21 DIAGNOSIS — H8110 Benign paroxysmal vertigo, unspecified ear: Secondary | ICD-10-CM

## 2015-04-21 DIAGNOSIS — E1129 Type 2 diabetes mellitus with other diabetic kidney complication: Secondary | ICD-10-CM | POA: Diagnosis not present

## 2015-04-21 DIAGNOSIS — E785 Hyperlipidemia, unspecified: Secondary | ICD-10-CM | POA: Diagnosis not present

## 2015-04-21 DIAGNOSIS — Z992 Dependence on renal dialysis: Secondary | ICD-10-CM | POA: Diagnosis not present

## 2015-04-21 DIAGNOSIS — E1165 Type 2 diabetes mellitus with hyperglycemia: Secondary | ICD-10-CM

## 2015-04-21 DIAGNOSIS — IMO0002 Reserved for concepts with insufficient information to code with codable children: Secondary | ICD-10-CM

## 2015-04-21 DIAGNOSIS — K5900 Constipation, unspecified: Secondary | ICD-10-CM

## 2015-04-21 DIAGNOSIS — D638 Anemia in other chronic diseases classified elsewhere: Secondary | ICD-10-CM | POA: Diagnosis not present

## 2015-04-21 DIAGNOSIS — K589 Irritable bowel syndrome without diarrhea: Secondary | ICD-10-CM | POA: Diagnosis not present

## 2015-04-21 DIAGNOSIS — F419 Anxiety disorder, unspecified: Secondary | ICD-10-CM

## 2015-04-21 DIAGNOSIS — R1110 Vomiting, unspecified: Secondary | ICD-10-CM | POA: Diagnosis not present

## 2015-04-21 DIAGNOSIS — C4491 Basal cell carcinoma of skin, unspecified: Secondary | ICD-10-CM | POA: Diagnosis not present

## 2015-04-21 DIAGNOSIS — N186 End stage renal disease: Secondary | ICD-10-CM | POA: Diagnosis not present

## 2015-04-21 DIAGNOSIS — K2990 Gastroduodenitis, unspecified, without bleeding: Secondary | ICD-10-CM

## 2015-04-21 NOTE — Progress Notes (Signed)
Patient ID: [name] James, female   DOB: 17-Sep-1936, 79 y.o.   MRN: [id number]   04/21/2015  Facility:  Nursing Home Location:  [place] Room Number: 205-2 LEVEL OF CARE:  SNF (31)   Chief Complaint  Patient presents with  . Medical Management of Chronic Issues    Gastrodudenitis, IBS, BCC, anemia, hypertension, constipation, diabetes mellitus, atrial fibrillation, hyperlipidemia, nausea, insomnia, anxiety, vertigo and ESRD    HISTORY OF PRESENT ILLNESS:  This is a 79 year old female who is being seen for a routine visit. She had finished short-term rehabilitation @ [facility] and is now a long-term care resident. Her nausea is now well controlled. She was recently ordered Carafate and verbalized improvement of abdominal pain. Her stool occult blood was + and had a GI consult. However, due to patient and daughter decided to not do any procedure @ this time since patient is too weak.  Latest hgbA1c 4.9. She is currently taking Humalog sliding scale and Levemir for diabetes. She was recently discharged from the hospital and recently started on hemodialysis. While she was having dialysis she started having palpitations, irregular heartbeat (heart rate 160) and chest pains. Workup in ED showed elevated troponin and EKG showed nonspecific T-wave change in lead II and aVF. Medical history is significant for diabetes mellitus, anemia, diastolic CHF and hypertension.    She recently consulted dermatology for her left nasal wound. Biopsy result shows Basal cell CA. No treatment @ this time but will follow-p in 6 months.  PAST MEDICAL HISTORY:  Past Medical History  Diagnosis Date  . CHF (congestive heart failure) 05/13/2011    echo - [id number]; stage 1 diastolic dysfunction; elevated LV filling pressure, MAC  . Chest pain, atypical 12/05/2008    R/Lmv- normal perfusion all regions, noe ECG changes   . Renal insufficiency 10/30/2005    doppler - abn resistance  consistent w/ parenchymal disease  . Claudication 10/30/2005    doppler - normal evaluation, no evidence of aneurysm, diameter reduction, dissection, compression or vascular abnormality)  . DM (diabetes mellitus)   . Peripheral neuropathy   . Hypertension   . Edema   . Gout   . Anxiety   . Arthritis   . Cataract   . GERD (gastroesophageal reflux disease)   . Complication of anesthesia   . PONV (postoperative nausea and vomiting)   . Family history of adverse reaction to anesthesia     son also has nausea   . CKD (chronic kidney disease), stage III   . Dyslipidemia   . Anemia   . Morbid obesity   . Atrial fibrillation   . Breast cancer 1980s    bil mastectomies, no radiation or chemo  . Renal cyst   . Barrett esophagus     CURRENT MEDICATIONS: Reviewed per MAR/see medication list  Allergies  Allergen Reactions  . Erythromycin Swelling  . Penicillins Other (See Comments)    Unknown allergic reaction.  She has taken keflex many times without problems  . Codeine Rash   REVIEW OF SYSTEMS:  GENERAL: no fatigue, no weight changes, no fever, chills  RESPIRATORY: no cough, SOB, DOE, wheezing, hemoptysis CARDIAC: no chest pain GI: no abdominal pain, diarrhea, constipation, heart burn, nausea or vomiting  PHYSICAL EXAMINATION  GENERAL: no acute distress, obese SKIN:   surgical site (fistula graft) on left ac is dry, no redness, + thrill/bruit EYES: conjunctivae normal, sclerae normal, normal eye lids NOSE:  Lesion on left side of  nose with dry red blood  NECK: supple, trachea midline, no neck masses, no thyroid tenderness, no thyromegaly LYMPHATICS: no LAN in the neck, no supraclavicular LAN RESPIRATORY: breathing is even & unlabored, BS CTAB CARDIAC: RRR, no murmur,no extra heart sounds, BLE edema 2+ GI: abdomen soft, normal BS, no masses, no tenderness, no hepatomegaly, no splenomegaly EXTREMITIES: Able to move all 4 extremities; Left wrist AV fistula + for bruit and  thrill, Left chest temporary catheter for dialysis PSYCHIATRIC: the patient is alert & oriented to person, affect & behavior appropriate  LABS/RADIOLOGY: Labs reviewed: Basic Metabolic Panel:  Recent Labs  11/13/14 0500  11/20/14 0500  12/01/14 1259 12/04/14 0500 12/06/14 0500  12/19/14 0501 12/19/14 0750 12/20/14 0413 12/22/14 0430 02/12/15 0636 03/20/15 1112 04/12/15 0616  NA 142  < > 136*  < > 136* 130* 131*  < >  --  140 138 136 136 141 141  K 4.5  < > 3.7  < > 4.6 4.3 3.9  < >  --  3.6 4.5 3.9 3.5 3.7 3.0*  CL 101  < > 97  < > 98 93* 92*  < >  --  101 101 101 98  --   --   CO2 21  < > 27  < > 25 24 24   < >  --  24 23 22   --   --   --   GLUCOSE 215*  < > 162*  < > 199* 122* 118*  < >  --  156* 188* 211* 179* 149* 158*  BUN 131*  < > 26*  < > 36* 53* 41*  < >  --  31* 43* 45* 73*  --   --   CREATININE 3.73*  < > 2.94*  < > 3.65* 5.13* 4.19*  < >  --  4.42* 5.49* 5.06* 5.60*  --   --   CALCIUM 8.6  8.3*  < > 8.3*  < > 8.7 8.9 8.7  < >  --  8.4 8.1* 8.6  --   --   --   MG 2.3  --  1.9  --   --   --   --   --  2.1  --   --   --   --   --   --   PHOS 5.6*  < > 4.8*  < > 2.7 1.7* 1.9*  --   --   --   --   --   --   --   --   < > = values in this interval not displayed. Liver Function Tests:  Recent Labs  11/06/14 0755  11/08/14 0714  11/13/14 0500  12/01/14 1259 12/04/14 0500 12/06/14 0500  AST 85*  --  45*  --  47*  --   --   --   --   ALT 65*  --  47*  --  74*  --   --   --   --   ALKPHOS 81  --  84  --  102  --   --   --   --   BILITOT <0.2*  --  <0.2*  --  0.3  --   --   --   --   PROT 6.0  --  5.8*  --  5.8*  --   --   --   --   ALBUMIN 2.4*  < > 3.1*  3.2*  < > 3.0*  < > 2.7*  2.8* 2.7*  < > = values in this interval not displayed.  CBC:  Recent Labs  11/13/14 0500  11/20/14 0500  12/18/14 1918  12/20/14 1311 12/21/14 0412 12/22/14 0430 02/12/15 0636 03/20/15 1112 04/12/15 0616  WBC 9.3  < > 8.6  < > 9.5  < > 8.8 9.7 9.1  --   --   --   NEUTROABS  7.0  --  5.7  --  4.8  --   --   --   --   --   --   --   HGB 8.3*  < > 9.1*  < > 11.2*  < > 10.3* 10.6* 10.4* 12.6 10.9* 11.9*  HCT 26.2*  < > 29.9*  < > 37.6  < > 35.1* 36.1 35.3* 37.0 32.0* 35.0*  MCV 98.5  < > 101.4*  < > 98.2  < > 97.2 99.4 97.0  --   --   --   PLT 158  < > 177  < > 159  < > 182 177 197  --   --   --   < > = values in this interval not displayed.  Cardiac Enzymes:  Recent Labs  11/25/14 0610  12/19/14 0501 12/19/14 0950 12/19/14 1605  CKTOTAL 11  --   --   --   --   TROPONINI  --   < > 0.09* 0.09* 0.08*  < > = values in this interval not displayed.  CBG:  Recent Labs  02/12/15 0853 03/20/15 1519 04/12/15 0951  GLUCAP 155* 124* 135*   No results found.  ASSESSMENT/PLAN:  Atrial fibrillation - rate controlled; continue amiodarone 200 mg by mouth  daily and Lopressor 50 mg by mouth twice a day; aspirin 81 mg take 4 tabs = 324 mg by mouth daily, not a candidate for Coumadin due to melanotic stool ESRD - on hemodialysis Mondays, Wednesdays and Fridays; continue fluid restriction 1800 mL/24 hour Hyperlipidemia - continue Lipitor 80 mg 1 tab by mouth every 6 p.m. Anemia of chronic disease - hemoglobin 11.9, stable; continue Aranesp 150 g/0.3 mL IV every Wednesdays during hemodialysis Diabetes mellitus with renal complication - hemoglobin A1c 4.9?; continue Levemir 10 units subcutaneous daily at bedtime and NovoLog sliding scale 3 times a day with meals Diastolic CHF - stable Insomnia - continue melatonin 6 mg by mouth daily at bedtime Renovascular hypertension - well controlled; continue Lopressor 50 mg by mouth twice a day  Hemodialysis associated hypotension - continue midodrine 10 mg by mouth every Mondays, Wednesdays and Fridays (dialysis days) Anxiety - mood is stable; continue Valium 2.5 mg by mouth daily at bedtime Gastritis - continue Protonix 40 mg by mouth BID Carafate 1 gm 1 tab PO ac meals IBS - recently started on Bentyl 20 mg 1 tab TID  PRN Nausea - change Zofran 4 mg PO QID PRN Vertigo - continue Antivert 12.5 mg PO Q 12 H PRN   Goals of care:  Long-term care   Labs/test ordered:   HgbA1c, CMP     [place], [place] [phone]

## 2015-04-23 DIAGNOSIS — D631 Anemia in chronic kidney disease: Secondary | ICD-10-CM | POA: Diagnosis not present

## 2015-04-23 DIAGNOSIS — E1122 Type 2 diabetes mellitus with diabetic chronic kidney disease: Secondary | ICD-10-CM | POA: Diagnosis not present

## 2015-04-23 DIAGNOSIS — E119 Type 2 diabetes mellitus without complications: Secondary | ICD-10-CM | POA: Diagnosis not present

## 2015-04-23 DIAGNOSIS — E0822 Diabetes mellitus due to underlying condition with diabetic chronic kidney disease: Secondary | ICD-10-CM | POA: Diagnosis not present

## 2015-04-23 DIAGNOSIS — I10 Essential (primary) hypertension: Secondary | ICD-10-CM | POA: Diagnosis not present

## 2015-04-23 DIAGNOSIS — D649 Anemia, unspecified: Secondary | ICD-10-CM | POA: Diagnosis not present

## 2015-04-23 DIAGNOSIS — N186 End stage renal disease: Secondary | ICD-10-CM | POA: Diagnosis not present

## 2015-04-23 DIAGNOSIS — N183 Chronic kidney disease, stage 3 (moderate): Secondary | ICD-10-CM | POA: Diagnosis not present

## 2015-04-23 LAB — BASIC METABOLIC PANEL
BUN: 26 mg/dL — AB (ref 4–21)
CREATININE: 4.7 mg/dL — AB (ref 0.5–1.1)
Glucose: 77 mg/dL
Potassium: 3.5 mmol/L (ref 3.4–5.3)
Sodium: 142 mmol/L (ref 137–147)

## 2015-04-23 LAB — HEPATIC FUNCTION PANEL
ALT: 17 U/L (ref 7–35)
AST: 28 U/L (ref 13–35)
Alkaline Phosphatase: 84 U/L (ref 25–125)
BILIRUBIN, TOTAL: 0.2 mg/dL

## 2015-04-23 LAB — HEMOGLOBIN A1C: Hgb A1c MFr Bld: 5 % (ref 4.0–6.0)

## 2015-04-25 DIAGNOSIS — N186 End stage renal disease: Secondary | ICD-10-CM | POA: Diagnosis not present

## 2015-04-25 DIAGNOSIS — D631 Anemia in chronic kidney disease: Secondary | ICD-10-CM | POA: Diagnosis not present

## 2015-04-25 DIAGNOSIS — E1122 Type 2 diabetes mellitus with diabetic chronic kidney disease: Secondary | ICD-10-CM | POA: Diagnosis not present

## 2015-04-27 DIAGNOSIS — N186 End stage renal disease: Secondary | ICD-10-CM | POA: Diagnosis not present

## 2015-04-27 DIAGNOSIS — D631 Anemia in chronic kidney disease: Secondary | ICD-10-CM | POA: Diagnosis not present

## 2015-04-27 DIAGNOSIS — E1122 Type 2 diabetes mellitus with diabetic chronic kidney disease: Secondary | ICD-10-CM | POA: Diagnosis not present

## 2015-04-30 DIAGNOSIS — N186 End stage renal disease: Secondary | ICD-10-CM | POA: Diagnosis not present

## 2015-04-30 DIAGNOSIS — E1122 Type 2 diabetes mellitus with diabetic chronic kidney disease: Secondary | ICD-10-CM | POA: Diagnosis not present

## 2015-04-30 DIAGNOSIS — D631 Anemia in chronic kidney disease: Secondary | ICD-10-CM | POA: Diagnosis not present

## 2015-05-02 DIAGNOSIS — D631 Anemia in chronic kidney disease: Secondary | ICD-10-CM | POA: Diagnosis not present

## 2015-05-02 DIAGNOSIS — E1122 Type 2 diabetes mellitus with diabetic chronic kidney disease: Secondary | ICD-10-CM | POA: Diagnosis not present

## 2015-05-02 DIAGNOSIS — N186 End stage renal disease: Secondary | ICD-10-CM | POA: Diagnosis not present

## 2015-05-04 DIAGNOSIS — E1122 Type 2 diabetes mellitus with diabetic chronic kidney disease: Secondary | ICD-10-CM | POA: Diagnosis not present

## 2015-05-04 DIAGNOSIS — N186 End stage renal disease: Secondary | ICD-10-CM | POA: Diagnosis not present

## 2015-05-04 DIAGNOSIS — D631 Anemia in chronic kidney disease: Secondary | ICD-10-CM | POA: Diagnosis not present

## 2015-05-07 DIAGNOSIS — D631 Anemia in chronic kidney disease: Secondary | ICD-10-CM | POA: Diagnosis not present

## 2015-05-07 DIAGNOSIS — N186 End stage renal disease: Secondary | ICD-10-CM | POA: Diagnosis not present

## 2015-05-07 DIAGNOSIS — E1122 Type 2 diabetes mellitus with diabetic chronic kidney disease: Secondary | ICD-10-CM | POA: Diagnosis not present

## 2015-05-09 DIAGNOSIS — E1122 Type 2 diabetes mellitus with diabetic chronic kidney disease: Secondary | ICD-10-CM | POA: Diagnosis not present

## 2015-05-09 DIAGNOSIS — D631 Anemia in chronic kidney disease: Secondary | ICD-10-CM | POA: Diagnosis not present

## 2015-05-09 DIAGNOSIS — N186 End stage renal disease: Secondary | ICD-10-CM | POA: Diagnosis not present

## 2015-05-11 DIAGNOSIS — E1122 Type 2 diabetes mellitus with diabetic chronic kidney disease: Secondary | ICD-10-CM | POA: Diagnosis not present

## 2015-05-11 DIAGNOSIS — D631 Anemia in chronic kidney disease: Secondary | ICD-10-CM | POA: Diagnosis not present

## 2015-05-11 DIAGNOSIS — N186 End stage renal disease: Secondary | ICD-10-CM | POA: Diagnosis not present

## 2015-05-14 DIAGNOSIS — D631 Anemia in chronic kidney disease: Secondary | ICD-10-CM | POA: Diagnosis not present

## 2015-05-14 DIAGNOSIS — E1122 Type 2 diabetes mellitus with diabetic chronic kidney disease: Secondary | ICD-10-CM | POA: Diagnosis not present

## 2015-05-14 DIAGNOSIS — N186 End stage renal disease: Secondary | ICD-10-CM | POA: Diagnosis not present

## 2015-05-16 DIAGNOSIS — N186 End stage renal disease: Secondary | ICD-10-CM | POA: Diagnosis not present

## 2015-05-16 DIAGNOSIS — D631 Anemia in chronic kidney disease: Secondary | ICD-10-CM | POA: Diagnosis not present

## 2015-05-16 DIAGNOSIS — E1122 Type 2 diabetes mellitus with diabetic chronic kidney disease: Secondary | ICD-10-CM | POA: Diagnosis not present

## 2015-05-18 DIAGNOSIS — N186 End stage renal disease: Secondary | ICD-10-CM | POA: Diagnosis not present

## 2015-05-18 DIAGNOSIS — D631 Anemia in chronic kidney disease: Secondary | ICD-10-CM | POA: Diagnosis not present

## 2015-05-18 DIAGNOSIS — E1122 Type 2 diabetes mellitus with diabetic chronic kidney disease: Secondary | ICD-10-CM | POA: Diagnosis not present

## 2015-05-21 DIAGNOSIS — N186 End stage renal disease: Secondary | ICD-10-CM | POA: Diagnosis not present

## 2015-05-21 DIAGNOSIS — E1122 Type 2 diabetes mellitus with diabetic chronic kidney disease: Secondary | ICD-10-CM | POA: Diagnosis not present

## 2015-05-21 DIAGNOSIS — D631 Anemia in chronic kidney disease: Secondary | ICD-10-CM | POA: Diagnosis not present

## 2015-05-22 ENCOUNTER — Encounter: Payer: Self-pay | Admitting: Adult Health

## 2015-05-22 ENCOUNTER — Non-Acute Institutional Stay (SKILLED_NURSING_FACILITY): Payer: Medicare Other | Admitting: Adult Health

## 2015-05-22 DIAGNOSIS — R110 Nausea: Secondary | ICD-10-CM

## 2015-05-22 DIAGNOSIS — Z992 Dependence on renal dialysis: Secondary | ICD-10-CM

## 2015-05-22 DIAGNOSIS — E785 Hyperlipidemia, unspecified: Secondary | ICD-10-CM

## 2015-05-22 DIAGNOSIS — K589 Irritable bowel syndrome without diarrhea: Secondary | ICD-10-CM | POA: Diagnosis not present

## 2015-05-22 DIAGNOSIS — K2970 Gastritis, unspecified, without bleeding: Secondary | ICD-10-CM | POA: Diagnosis not present

## 2015-05-22 DIAGNOSIS — E1122 Type 2 diabetes mellitus with diabetic chronic kidney disease: Secondary | ICD-10-CM | POA: Diagnosis not present

## 2015-05-22 DIAGNOSIS — I150 Renovascular hypertension: Secondary | ICD-10-CM

## 2015-05-22 DIAGNOSIS — F419 Anxiety disorder, unspecified: Secondary | ICD-10-CM | POA: Diagnosis not present

## 2015-05-22 DIAGNOSIS — N186 End stage renal disease: Secondary | ICD-10-CM

## 2015-05-22 DIAGNOSIS — K2990 Gastroduodenitis, unspecified, without bleeding: Secondary | ICD-10-CM

## 2015-05-22 DIAGNOSIS — E1129 Type 2 diabetes mellitus with other diabetic kidney complication: Secondary | ICD-10-CM

## 2015-05-22 DIAGNOSIS — I953 Hypotension of hemodialysis: Secondary | ICD-10-CM | POA: Diagnosis not present

## 2015-05-22 DIAGNOSIS — E1165 Type 2 diabetes mellitus with hyperglycemia: Secondary | ICD-10-CM

## 2015-05-22 DIAGNOSIS — I480 Paroxysmal atrial fibrillation: Secondary | ICD-10-CM | POA: Diagnosis not present

## 2015-05-22 DIAGNOSIS — G4700 Insomnia, unspecified: Secondary | ICD-10-CM

## 2015-05-22 DIAGNOSIS — IMO0002 Reserved for concepts with insufficient information to code with codable children: Secondary | ICD-10-CM

## 2015-05-22 DIAGNOSIS — D638 Anemia in other chronic diseases classified elsewhere: Secondary | ICD-10-CM

## 2015-05-22 NOTE — Progress Notes (Signed)
Patient ID: [name] James, female   DOB: 1936/03/09, 79 y.o.   MRN: [id number]   05/22/2015  Facility:  Nursing Home Location:  [place] Room Number: 205-2 LEVEL OF CARE:  SNF (31)   Chief Complaint  Patient presents with  . Medical Management of Chronic Issues    Gastrodudenitis, IBS, BCC, anemia, hypertension, constipation, diabetes mellitus, atrial fibrillation, hyperlipidemia, nausea, insomnia, anxiety, vertigo and ESRD    HISTORY OF PRESENT ILLNESS:  This is a 79 year old female who is being seen for a routine visit. She is a long-term resident  @ [place].Her nausea is now well controlled. Her Protonix has recently been decreased to daily instead of BID. She was recently ordered Carafate and verbalized improvement of abdominal pain. Her stool occult blood was + and had a GI consult. However, patient and daughter decided to not do any procedure @ this time since patient is too weak.  Latest hgbA1c 5.0. She is currently taking Humalog sliding scale and Levemir for diabetes. She was recently discharged from the hospital and recently started on hemodialysis. While she was having dialysis she started having palpitations, irregular heartbeat (heart rate 160) and chest pains. Workup in ED showed elevated troponin and EKG showed nonspecific T-wave change in lead II and aVF. Medical history is significant for diabetes mellitus, anemia, diastolic CHF and hypertension.    She recently consulted dermatology for her left nasal wound. Biopsy result shows Basal cell CA. No treatment @ this time but will follow-p in 6 months.  PAST MEDICAL HISTORY:  Past Medical History  Diagnosis Date  . CHF (congestive heart failure) 05/13/2011    echo - [id number]; stage 1 diastolic dysfunction; elevated LV filling pressure, MAC  . Chest pain, atypical 12/05/2008    R/Lmv- normal perfusion all regions, noe ECG changes   . Renal insufficiency 10/30/2005    doppler - abn resistance  consistent w/ parenchymal disease  . Claudication 10/30/2005    doppler - normal evaluation, no evidence of aneurysm, diameter reduction, dissection, compression or vascular abnormality)  . DM (diabetes mellitus)   . Peripheral neuropathy   . Hypertension   . Edema   . Gout   . Anxiety   . Arthritis   . Cataract   . GERD (gastroesophageal reflux disease)   . Complication of anesthesia   . PONV (postoperative nausea and vomiting)   . Family history of adverse reaction to anesthesia     son also has nausea   . CKD (chronic kidney disease), stage III   . Dyslipidemia   . Anemia   . Morbid obesity   . Atrial fibrillation   . Breast cancer 1980s    bil mastectomies, no radiation or chemo  . Renal cyst   . Barrett esophagus     CURRENT MEDICATIONS: Reviewed per MAR/see medication list  Allergies  Allergen Reactions  . Erythromycin Swelling  . Penicillins Other (See Comments)    Unknown allergic reaction.  She has taken keflex many times without problems  . Codeine Rash   REVIEW OF SYSTEMS:  GENERAL: no fatigue, no weight changes, no fever, chills  RESPIRATORY: no cough, SOB, DOE, wheezing, hemoptysis CARDIAC: no chest pain GI: no abdominal pain, diarrhea, constipation, heart burn, nausea or vomiting  PHYSICAL EXAMINATION  GENERAL: no acute distress, obese SKIN:   surgical site (fistula graft) on left ac is dry, no redness, + thrill/bruit NOSE:  Lesion on left side of nose with dry red blood  NECK: supple, trachea midline, no neck masses, no thyroid tenderness, no thyromegaly LYMPHATICS: no LAN in the neck, no supraclavicular LAN RESPIRATORY: breathing is even & unlabored, BS CTAB CARDIAC: RRR, no murmur,no extra heart sounds, BLE edema 2+ GI: abdomen soft, normal BS, no masses, no tenderness, no hepatomegaly, no splenomegaly EXTREMITIES: Able to move all 4 extremities; Left wrist AV fistula + for bruit and thrill, Left chest temporary catheter for dialysis PSYCHIATRIC:  the patient is alert & oriented to person, affect & behavior appropriate  LABS/RADIOLOGY: Labs reviewed: Basic Metabolic Panel:  Recent Labs  11/13/14 0500  11/20/14 0500  12/01/14 1259 12/04/14 0500 12/06/14 0500  12/19/14 0501 12/19/14 0750 12/20/14 0413 12/22/14 0430 02/12/15 0636 03/20/15 1112 04/12/15 0616  NA 142  < > 136*  < > 136* 130* 131*  < >  --  140 138 136 136 141 141  K 4.5  < > 3.7  < > 4.6 4.3 3.9  < >  --  3.6 4.5 3.9 3.5 3.7 3.0*  CL 101  < > 97  < > 98 93* 92*  < >  --  101 101 101 98  --   --   CO2 21  < > 27  < > 25 24 24   < >  --  24 23 22   --   --   --   GLUCOSE 215*  < > 162*  < > 199* 122* 118*  < >  --  156* 188* 211* 179* 149* 158*  BUN 131*  < > 26*  < > 36* 53* 41*  < >  --  31* 43* 45* 73*  --   --   CREATININE 3.73*  < > 2.94*  < > 3.65* 5.13* 4.19*  < >  --  4.42* 5.49* 5.06* 5.60*  --   --   CALCIUM 8.6  8.3*  < > 8.3*  < > 8.7 8.9 8.7  < >  --  8.4 8.1* 8.6  --   --   --   MG 2.3  --  1.9  --   --   --   --   --  2.1  --   --   --   --   --   --   PHOS 5.6*  < > 4.8*  < > 2.7 1.7* 1.9*  --   --   --   --   --   --   --   --   < > = values in this interval not displayed. Liver Function Tests:  Recent Labs  11/06/14 0755  11/08/14 0714  11/13/14 0500  12/01/14 1259 12/04/14 0500 12/06/14 0500  AST 85*  --  45*  --  47*  --   --   --   --   ALT 65*  --  47*  --  74*  --   --   --   --   ALKPHOS 81  --  84  --  102  --   --   --   --   BILITOT <0.2*  --  <0.2*  --  0.3  --   --   --   --   PROT 6.0  --  5.8*  --  5.8*  --   --   --   --   ALBUMIN 2.4*  < > 3.1*  3.2*  < > 3.0*  < > 2.7* 2.8* 2.7*  < > =  values in this interval not displayed.  CBC:  Recent Labs  11/13/14 0500  11/20/14 0500  12/18/14 1918  12/20/14 1311 12/21/14 0412 12/22/14 0430 02/12/15 0636 03/20/15 1112 04/12/15 0616  WBC 9.3  < > 8.6  < > 9.5  < > 8.8 9.7 9.1  --   --   --   NEUTROABS 7.0  --  5.7  --  4.8  --   --   --   --   --   --   --   HGB  8.3*  < > 9.1*  < > 11.2*  < > 10.3* 10.6* 10.4* 12.6 10.9* 11.9*  HCT 26.2*  < > 29.9*  < > 37.6  < > 35.1* 36.1 35.3* 37.0 32.0* 35.0*  MCV 98.5  < > 101.4*  < > 98.2  < > 97.2 99.4 97.0  --   --   --   PLT 158  < > 177  < > 159  < > 182 177 197  --   --   --   < > = values in this interval not displayed.  Cardiac Enzymes:  Recent Labs  11/25/14 0610  12/19/14 0501 12/19/14 0950 12/19/14 1605  CKTOTAL 11  --   --   --   --   TROPONINI  --   < > 0.09* 0.09* 0.08*  < > = values in this interval not displayed.  CBG:  Recent Labs  02/12/15 0853 03/20/15 1519 04/12/15 0951  GLUCAP 155* 124* 135*   No results found.  ASSESSMENT/PLAN:  Atrial fibrillation - rate controlled; continue amiodarone 200 mg by mouth  daily and Lopressor 50 mg by mouth twice a day; aspirin 81 mg take 4 tabs = 324 mg by mouth daily, not a candidate for Coumadin due to melanotic stool ESRD - on hemodialysis Mondays, Wednesdays and Fridays; continue fluid restriction 1800 mL/24 hour Hyperlipidemia - continue Lipitor 80 mg 1 tab by mouth every 6 p.m. Anemia of chronic disease - hemoglobin 10.2, stable; continue Aranesp 150 g/0.3 mL IV every Wednesdays during hemodialysis Diabetes mellitus with renal complication - hemoglobin A1c 5.0; continue Levemir 10 units subcutaneous daily at bedtime and NovoLog sliding scale 3 times a day with meals Insomnia - continue melatonin 6 mg by mouth daily at bedtime Renovascular hypertension - well controlled; continue Lopressor 50 mg by mouth twice a day  Hemodialysis associated hypotension - continue midodrine 10 mg by mouth every Mondays, Wednesdays and Fridays (dialysis days) Anxiety - mood is stable; continue Valium 2.5 mg by mouth daily at bedtime Gastritis - continue Protonix 40 mg by mouth BID Carafate 1 gm 1 tab PO ac meals IBS - continue Bentyl 20 mg 1 tab TID PRN Nausea - continue Zofran 4 mg PO QID PRN Vertigo - continue Antivert 12.5 mg PO Q 12 H PRN   Goals  of care:  Long-term care     [facility], [facility] [phone]

## 2015-05-23 DIAGNOSIS — N186 End stage renal disease: Secondary | ICD-10-CM | POA: Diagnosis not present

## 2015-05-23 DIAGNOSIS — E1122 Type 2 diabetes mellitus with diabetic chronic kidney disease: Secondary | ICD-10-CM | POA: Diagnosis not present

## 2015-05-23 DIAGNOSIS — D509 Iron deficiency anemia, unspecified: Secondary | ICD-10-CM | POA: Diagnosis not present

## 2015-05-23 DIAGNOSIS — D631 Anemia in chronic kidney disease: Secondary | ICD-10-CM | POA: Diagnosis not present

## 2015-05-24 DIAGNOSIS — Z85828 Personal history of other malignant neoplasm of skin: Secondary | ICD-10-CM | POA: Diagnosis not present

## 2015-05-24 DIAGNOSIS — Z08 Encounter for follow-up examination after completed treatment for malignant neoplasm: Secondary | ICD-10-CM | POA: Diagnosis not present

## 2015-05-25 DIAGNOSIS — D509 Iron deficiency anemia, unspecified: Secondary | ICD-10-CM | POA: Diagnosis not present

## 2015-05-25 DIAGNOSIS — E1122 Type 2 diabetes mellitus with diabetic chronic kidney disease: Secondary | ICD-10-CM | POA: Diagnosis not present

## 2015-05-25 DIAGNOSIS — N186 End stage renal disease: Secondary | ICD-10-CM | POA: Diagnosis not present

## 2015-05-25 DIAGNOSIS — D631 Anemia in chronic kidney disease: Secondary | ICD-10-CM | POA: Diagnosis not present

## 2015-05-28 DIAGNOSIS — D509 Iron deficiency anemia, unspecified: Secondary | ICD-10-CM | POA: Diagnosis not present

## 2015-05-28 DIAGNOSIS — N186 End stage renal disease: Secondary | ICD-10-CM | POA: Diagnosis not present

## 2015-05-28 DIAGNOSIS — E1122 Type 2 diabetes mellitus with diabetic chronic kidney disease: Secondary | ICD-10-CM | POA: Diagnosis not present

## 2015-05-28 DIAGNOSIS — D631 Anemia in chronic kidney disease: Secondary | ICD-10-CM | POA: Diagnosis not present

## 2015-05-30 DIAGNOSIS — D509 Iron deficiency anemia, unspecified: Secondary | ICD-10-CM | POA: Diagnosis not present

## 2015-05-30 DIAGNOSIS — N186 End stage renal disease: Secondary | ICD-10-CM | POA: Diagnosis not present

## 2015-05-30 DIAGNOSIS — D631 Anemia in chronic kidney disease: Secondary | ICD-10-CM | POA: Diagnosis not present

## 2015-05-30 DIAGNOSIS — E1122 Type 2 diabetes mellitus with diabetic chronic kidney disease: Secondary | ICD-10-CM | POA: Diagnosis not present

## 2015-06-01 ENCOUNTER — Ambulatory Visit: Payer: Medicare Other | Admitting: Internal Medicine

## 2015-06-01 DIAGNOSIS — E1122 Type 2 diabetes mellitus with diabetic chronic kidney disease: Secondary | ICD-10-CM | POA: Diagnosis not present

## 2015-06-01 DIAGNOSIS — N186 End stage renal disease: Secondary | ICD-10-CM | POA: Diagnosis not present

## 2015-06-01 DIAGNOSIS — D509 Iron deficiency anemia, unspecified: Secondary | ICD-10-CM | POA: Diagnosis not present

## 2015-06-01 DIAGNOSIS — D631 Anemia in chronic kidney disease: Secondary | ICD-10-CM | POA: Diagnosis not present

## 2015-06-04 DIAGNOSIS — D509 Iron deficiency anemia, unspecified: Secondary | ICD-10-CM | POA: Diagnosis not present

## 2015-06-04 DIAGNOSIS — D631 Anemia in chronic kidney disease: Secondary | ICD-10-CM | POA: Diagnosis not present

## 2015-06-04 DIAGNOSIS — E1122 Type 2 diabetes mellitus with diabetic chronic kidney disease: Secondary | ICD-10-CM | POA: Diagnosis not present

## 2015-06-04 DIAGNOSIS — N186 End stage renal disease: Secondary | ICD-10-CM | POA: Diagnosis not present

## 2015-06-05 ENCOUNTER — Other Ambulatory Visit: Payer: Self-pay | Admitting: *Deleted

## 2015-06-05 ENCOUNTER — Ambulatory Visit (INDEPENDENT_AMBULATORY_CARE_PROVIDER_SITE_OTHER): Payer: Medicare Other | Admitting: Internal Medicine

## 2015-06-05 ENCOUNTER — Encounter: Payer: Self-pay | Admitting: Internal Medicine

## 2015-06-05 VITALS — BP 134/82 | HR 70 | Resp 18 | Ht 64.5 in | Wt 205.0 lb

## 2015-06-05 DIAGNOSIS — R197 Diarrhea, unspecified: Secondary | ICD-10-CM

## 2015-06-05 DIAGNOSIS — K2970 Gastritis, unspecified, without bleeding: Secondary | ICD-10-CM

## 2015-06-05 DIAGNOSIS — R630 Anorexia: Secondary | ICD-10-CM

## 2015-06-05 DIAGNOSIS — K2990 Gastroduodenitis, unspecified, without bleeding: Secondary | ICD-10-CM

## 2015-06-05 DIAGNOSIS — R110 Nausea: Secondary | ICD-10-CM | POA: Diagnosis not present

## 2015-06-05 MED ORDER — PANTOPRAZOLE SODIUM 40 MG PO TBEC: 40.0000 mg | DELAYED_RELEASE_TABLET | Freq: Every day | ORAL | Status: DC

## 2015-06-05 MED ORDER — PANTOPRAZOLE SODIUM 40 MG PO TBEC
40.0000 mg | DELAYED_RELEASE_TABLET | Freq: Every day | ORAL | Status: DC
Start: 2015-06-05 — End: 2016-01-15

## 2015-06-05 MED ORDER — DICYCLOMINE HCL 20 MG PO TABS
20.0000 mg | ORAL_TABLET | Freq: Three times a day (TID) | ORAL | Status: DC
Start: 2015-06-05 — End: 2017-02-03

## 2015-06-05 NOTE — Progress Notes (Signed)
Subjective:    Patient ID: [name] James, female    DOB: 1936-01-04, 79 y.o.   MRN: [id number]  HPI [name] James is a 79 yo female with PMH of gastroduodenitis/Barrett's esophagus/FOBT positive normocytic anemia, COPD, diabetes, CHF, ESRD on HD, remote breast cancer who seen for follow-up. She is here today with her daughter. She was last seen on April 03 2015.   At her last visit we discussed abdominal pain early satiety and nausea. She felt that Carafate helped with the symptoms tremendously but had been discontinued by nephrology over concern of aluminum toxicity. I discussed restarting Carafate given positive clinical response with Dr. [name] and it was restarted. However this time it was not helpful and in fact lead to increased diarrhea and nausea. The patient stopped it tried it again and same symptoms return. For the last few weeks she's had more diarrhea and her daughter is concerned she has a gastroenteritis or infectious colitis. She denies abdominal pain denies rectal bleeding or melena. She is using Imodium on most days and is having some intermittent crampy lower abdominal pain. Appetite has remained decreased and she is eating small amounts of food. She denies heartburn. She is using pantoprazole 40 mg twice daily. She is using Zofran at least once on most days. There is question about laxity gives an anti-diarrheal medications on her medicine list. She feels she is getting Senokot at least occasionally. She tries to keep up with her medicines and refuses medicines at times when she feels they are necessary. She reports blood sugars have been within normal limits recently. She also recalls being told that her protein levels remain low but her hemoglobin has been stable. She is using Bentyl on an as-needed basis.    she is going to dialysis Monday Wednesday and Friday and completing sessions without problems. She states that her abdominal symptoms are not worse after  dialysis.  Review of Systems As per history of present illness, otherwise negative  Current Medications, Allergies, Past Medical History, Past Surgical History, Family History and Social History were reviewed in [employer] record.      Objective:   Physical Exam BP 134/82 mmHg  Pulse 70  Resp 18  Ht 5' 4.5" (1.638 m)  Wt 205 lb (92.987 kg)  BMI 34.66 kg/m2 Constitutional: Well-developed and well-nourished, though chronically ill appearing. No distress. HEENT: Normocephalic and atraumatic. Conjunctivae are normal.  No scleral icterus. O2 by [place] in place Neck: Neck supple. Trachea midline. Cardiovascular: Normal rate, regular rhythm and intact distal pulses. Pulmonary/chest: Effort normal and breath sounds normal. No wheezing, rales or rhonchi. Abdominal: Soft, nontender, nondistended. Bowel sounds active throughout.  Extremities: no clubbing, cyanosis, trace pretib edema Neurological: Alert and oriented to person place and time. Psychiatric: Normal mood and affect. Behavior is normal.  Labs 04/23/15 Comprehensive metabolic panel within normal limits except for: BUN 26, creatinine 4.69, total protein 4.7, albumen 2.1, calcium 7.9 Hgb A1c 5.0     Assessment & Plan:   79 yo female with PMH of gastroduodenitis/Barrett's esophagus/FOBT positive normocytic anemia, COPD, diabetes, CHF, ESRD on HD, remote breast cancer who seen for follow-up.  1. Loose stools/nausea -- symptoms are predominantly that of loose stools and intermittent nausea. She still reports poor appetite. She did not respond to Carafate and I'm discontinuing it altogether. I'm concerned that she is getting intermittent laxatives at her skilled nursing facility. Also need to exclude infectious colitis such as C. difficile given her healthcare exposure. Discontinue MiraLAX,  discontinue Senokot, discontinue sucralfate. Imodium can be used 2 mg up to 4 times daily on an as-needed basis. C. difficile PCR,  stool culture, ova and parasite exam, fecal leukocytes and fecal elastase.  Fax results to my office. Decreased pantoprazole 40 mg once daily as this medication can be associated with loose stools as well. Schedule Bentyl 20 mg 3 times daily, can be refused by patient if asymptomatic. --History of heme positive stool -- still too weak for colonoscopy as an outpatient at this time. We discussed this again and both she and her daughter agree.  2. History of gastroduodenitis -- seen by endoscopy in November 2015. See #1. Decrease pantoprazole 40 mg once daily. Avoid NSAIDs.  Follow-up in 8-12 weeks, sooner if necessary

## 2015-06-05 NOTE — Patient Instructions (Addendum)
Please take Bentyl three times a day  Please decrease pantoprazole 40 mg daily  Please discontinue the follow medication: Miralax, Senokot, Carafate  We have requested stool studies. Please fax results back over to [phone]  Please follow up in 8-12 weeks

## 2015-06-06 DIAGNOSIS — E1122 Type 2 diabetes mellitus with diabetic chronic kidney disease: Secondary | ICD-10-CM | POA: Diagnosis not present

## 2015-06-06 DIAGNOSIS — D509 Iron deficiency anemia, unspecified: Secondary | ICD-10-CM | POA: Diagnosis not present

## 2015-06-06 DIAGNOSIS — N186 End stage renal disease: Secondary | ICD-10-CM | POA: Diagnosis not present

## 2015-06-06 DIAGNOSIS — D631 Anemia in chronic kidney disease: Secondary | ICD-10-CM | POA: Diagnosis not present

## 2015-06-07 DIAGNOSIS — Z452 Encounter for adjustment and management of vascular access device: Secondary | ICD-10-CM | POA: Diagnosis not present

## 2015-06-07 DIAGNOSIS — K580 Irritable bowel syndrome with diarrhea: Secondary | ICD-10-CM | POA: Diagnosis not present

## 2015-06-08 ENCOUNTER — Encounter: Payer: Self-pay | Admitting: Internal Medicine

## 2015-06-08 ENCOUNTER — Non-Acute Institutional Stay (SKILLED_NURSING_FACILITY): Payer: Medicare Other | Admitting: Internal Medicine

## 2015-06-08 DIAGNOSIS — E1140 Type 2 diabetes mellitus with diabetic neuropathy, unspecified: Secondary | ICD-10-CM | POA: Diagnosis not present

## 2015-06-08 DIAGNOSIS — N058 Unspecified nephritic syndrome with other morphologic changes: Secondary | ICD-10-CM

## 2015-06-08 DIAGNOSIS — E1122 Type 2 diabetes mellitus with diabetic chronic kidney disease: Secondary | ICD-10-CM | POA: Diagnosis not present

## 2015-06-08 DIAGNOSIS — D631 Anemia in chronic kidney disease: Secondary | ICD-10-CM | POA: Diagnosis not present

## 2015-06-08 DIAGNOSIS — D509 Iron deficiency anemia, unspecified: Secondary | ICD-10-CM | POA: Diagnosis not present

## 2015-06-08 DIAGNOSIS — E1129 Type 2 diabetes mellitus with other diabetic kidney complication: Secondary | ICD-10-CM | POA: Diagnosis not present

## 2015-06-08 DIAGNOSIS — N186 End stage renal disease: Secondary | ICD-10-CM | POA: Diagnosis not present

## 2015-06-08 DIAGNOSIS — I739 Peripheral vascular disease, unspecified: Secondary | ICD-10-CM | POA: Diagnosis not present

## 2015-06-08 NOTE — Progress Notes (Signed)
Patient ID: [name] James, female   DOB: 08-Oct-1936, 79 y.o.   MRN: [id number]     [place]  PCP: Shirline Frees, MD  Code Status: Full Code   Allergies  Allergen Reactions  . Erythromycin Swelling  . Penicillins Other (See Comments)    Unknown allergic reaction.  She has taken keflex many times without problems  . Codeine Rash    Chief Complaint  Patient presents with  . Acute Visit    diabetic foot exam for diabetic shoes     HPI:  79 year old patient is a long term care resident. She has medical history of diabetes mellitus, HLD, CHF, HTN, anemia of chronic disease and ESRD on dialysis among others. She is seen today for assessment for diabetic shoes and insert. She is currently taking Humalog sliding scale and Levemir for diabetes. She has neuropathy in her feet. denies any open sores or wounds to her feet. She is wheelchair bound. No falls reported. cbg on review breakfast 97-125 with lowest of 78 and highest 158. During the daytime cbg ranging between 118-200  Review of Systems:  Constitutional: Negative for fever, chills, diaphoresis.  HENT: Negative for headache, congestion, nasal discharge Respiratory: Negative for cough, shortness of breath and wheezing.   Cardiovascular: Negative for chest pain, palpitations, leg swelling.  Gastrointestinal: Negative for heartburn, nausea, vomiting, abdominal pain Musculoskeletal: Negative for  falls Skin: Negative for itching, rash.  Neurological: Negative for dizziness, tingling, focal weakness Psychiatric/Behavioral: Negative for depression.    Past Medical History  Diagnosis Date  . CHF (congestive heart failure) 05/13/2011    echo - [id number]; stage 1 diastolic dysfunction; elevated LV filling pressure, MAC  . Chest pain, atypical 12/05/2008    R/Lmv- normal perfusion all regions, noe ECG changes   . Renal insufficiency 10/30/2005    doppler - abn resistance consistent w/ parenchymal disease  .  Claudication 10/30/2005    doppler - normal evaluation, no evidence of aneurysm, diameter reduction, dissection, compression or vascular abnormality)  . DM (diabetes mellitus)   . Peripheral neuropathy   . Hypertension   . Edema   . Gout   . Anxiety   . Arthritis   . Cataract   . GERD (gastroesophageal reflux disease)   . Complication of anesthesia   . PONV (postoperative nausea and vomiting)   . Family history of adverse reaction to anesthesia     son also has nausea   . CKD (chronic kidney disease), stage III   . Dyslipidemia   . Anemia   . Morbid obesity   . Atrial fibrillation   . Breast cancer 1980s    bil mastectomies, no radiation or chemo  . Renal cyst   . Barrett esophagus    Past Surgical History  Procedure Laterality Date  . Mastectomy Bilateral ~1982    bil breast reconstruction with implants  . Back surgery  ~1980  . Hemorrhoid surgery    . Cholecystectomy    . Eye surgery    . Orif tibia & fibula fractures Left 2007    also had left non displaced malleolar fracture.   . Esophagogastroduodenoscopy N/A 10/24/2014    Procedure: ESOPHAGOGASTRODUODENOSCOPY (EGD);  Surgeon: Jerene Bears, MD;  Location: [facility] ENDOSCOPY;  Service: Endoscopy;  Laterality: N/A;  . Av fistula placement Left 11/30/2014    Procedure: ARTERIOVENOUS (AV) FISTULA CREATION LEFT ARM;  Surgeon: Angelia Mould, MD;  Location: [place];  Service: Vascular;  Laterality: Left;  .  Exchange of a dialysis catheter Left 11/30/2014    Procedure: EXCHANGE OF A DIALYSIS CATHETER, LEFT INTERNAL JUGULAR;  Surgeon: Angelia Mould, MD;  Location: [place];  Service: Vascular;  Laterality: Left;  . Fistulogram N/A 02/12/2015    Procedure: FISTULOGRAM;  Surgeon: Angelia Mould, MD;  Location: [facility] CATH LAB;  Service: Cardiovascular;  Laterality: N/A;  . Angioplasty Left 02/12/2015    Procedure: ANGIOPLASTY;  Surgeon: Angelia Mould, MD;  Location: [facility] CATH LAB;  Service: Cardiovascular;  Laterality:  Left;  AVF  . Av fistula placement Left 03/20/2015    Procedure:  Inserton of Left Upper Arm Gortex Graft;  Surgeon: Angelia Mould, MD;  Location: [place];  Service: Vascular;  Laterality: Left;  . Ligation of arteriovenous  fistula Left 03/20/2015    Procedure: LIGATION OF ARTERIOVENOUS  FISTULA;  Surgeon: Angelia Mould, MD;  Location: [place];  Service: Vascular;  Laterality: Left;  . Av fistula placement Left 04/12/2015    Procedure: INSERTION OF LEFT ARM  ARTERIOVENOUS GORE-TEX GRAFT ;  Surgeon: Angelia Mould, MD;  Location: [place];  Service: Vascular;  Laterality: Left;  . Removal of graft Left 04/12/2015    Procedure: REMOVAL OF LEFT ARM ARTERIOVENOUS GORE-TEX GRAFT;  Surgeon: Angelia Mould, MD;  Location: [place];  Service: Vascular;  Laterality: Left;   Social History:   reports that she has never smoked. She has never used smokeless tobacco. She reports that she does not drink alcohol or use illicit drugs.  Family History  Problem Relation Age of Onset  . Cancer Mother   . Diabetes Mother   . Heart disease Mother   . Heart attack Father   . Stroke Father   . Parkinson's disease Brother   . Cancer Sister     Medications: Patient's Medications  New Prescriptions   No medications on file  Previous Medications   AMINO ACIDS-PROTEIN HYDROLYS (FEEDING SUPPLEMENT, PRO-STAT SUGAR FREE 64,) LIQD    Take 30 mLs by mouth 3 (three) times daily with meals.   ASPIRIN EC 325 MG TABLET    Take 325 mg by mouth daily.   ATORVASTATIN (LIPITOR) 80 MG TABLET    Take 1 tablet (80 mg total) by mouth daily at 6 PM.   B COMPLEX-C-FOLIC ACID (NEPHRO-VITE PO)    Take by mouth. 1 by mouth every night at bedtime   DICYCLOMINE (BENTYL) 20 MG TABLET    Take 1 tablet (20 mg total) by mouth 3 (three) times daily.   DORZOLAMIDE-TIMOLOL (COSOPT) 22.3-6.8 MG/ML OPHTHALMIC SOLUTION    Place 1 drop into the left eye 2 (two) times daily.    FOLIC ACID (FOLVITE) 1 MG TABLET    Take 1 mg by  mouth daily.   INSULIN DETEMIR (LEVEMIR) 100 UNIT/ML INJECTION    Inject 10 Units into the skin at bedtime.    INSULIN LISPRO (HUMALOG) 100 UNIT/ML INJECTION    Inject 0-12 Units into the skin 3 (three) times daily with meals. Per sliding scale:  150-200 2 units, 201-250 4 units, 251-300 6 units, 301-350 8 units, 351-400 10 units, >400 give 12 units and call MD   LORATADINE (CLARITIN) 10 MG TABLET    Take 10 mg by mouth daily as needed for allergies.    MECLIZINE (ANTIVERT) 12.5 MG TABLET    Take 12.5 mg by mouth every 12 (twelve) hours as needed for dizziness.   MELATONIN 3 MG TABS    Take 6 mg by mouth  at bedtime.   METOPROLOL (LOPRESSOR) 50 MG TABLET    Take 50 mg by mouth 2 (two) times daily. Hold for blood pressure <110 and heart rate <60   MIDODRINE (PROAMATINE) 10 MG TABLET    Take 10 mg by mouth every Monday, Wednesday, and Friday. On dialysis days   MULTIPLE VITAMINS-MINERALS (DECUBI-VITE) CAPS    Take by mouth. Take 1 capsule by mouth daily for wound healing   ONDANSETRON (ZOFRAN-ODT) 4 MG DISINTEGRATING TABLET    Take 4 mg by mouth every 6 (six) hours. PER MED LIST FROM [facility], PT IS TO TAKE EVERY 6 HOURS AROUND THE CLOCK   OXYCODONE (OXY IR/ROXICODONE) 5 MG IMMEDIATE RELEASE TABLET    Take 1 tablet (5 mg total) by mouth every 4 (four) hours as needed for severe pain.   PANTOPRAZOLE (PROTONIX) 40 MG TABLET    Take 1 tablet (40 mg total) by mouth daily.   POLYETHYLENE GLYCOL (MIRALAX / GLYCOLAX) PACKET    Take 17 g by mouth daily as needed for mild constipation (For mild constipation).   SENNOSIDES-DOCUSATE SODIUM (SENOKOT-S) 8.6-50 MG TABLET    Take 1 tablet by mouth daily.   SUCRALFATE (CARAFATE PO)    Take 10 mg by mouth. 1 by mouth daily before meals and at bedtime for indigestion  Modified Medications   Modified Medication Previous Medication   AMIODARONE (PACERONE) 200 MG TABLET amiodarone (PACERONE) 200 MG tablet      Take 1 tablet (200 mg total) by mouth daily.    Take 1 tablet  (200 mg total) by mouth 2 (two) times daily.   DIAZEPAM (VALIUM) 5 MG TABLET diazepam (VALIUM) 5 MG tablet      Take 1/2 tablet by mouth once at bedtime for rest    Take 1/2 tablet by mouth once at bedtime for rest  Discontinued Medications   LOPERAMIDE (IMODIUM A-D) 2 MG TABLET    Take 2 mg by mouth as needed for diarrhea or loose stools.   PROMETHAZINE (PHENERGAN) 25 MG TABLET    Take 25 mg by mouth 2 (two) times daily as needed for nausea or vomiting.     Physical Exam: Filed Vitals:   06/08/15 1117  BP: 125/72  Pulse: 64  Temp: 96.6 F (35.9 C)  TempSrc: Oral  Resp: 18  SpO2: 97%   Wt Readings from Last 3 Encounters:  06/05/15 205 lb (92.987 kg)  05/22/15 205 lb (92.987 kg)  04/21/15 208 lb (94.348 kg)    General- elderly female, in no acute distress Head- normocephalic, atraumatic Eyes- no pallor, no icterus, no discharge, normal conjunctiva, normal sclera Neck- no cervical lymphadenopathy Cardiovascular- normal s1,s2, no murmurs Respiratory- bilateral clear to auscultation, no wheeze, no rhonchi, no crackles, no use of accessory muscles Abdomen- bowel sounds present, soft, non tender Musculoskeletal- able to move all 4 extremities, on wheelchair, 1+ bilateral leg edema Neurological- no focal deficit Skin- warm and dry Psychiatry- alert and oriented   Diabetic Foot Exam - Simple   Simple Foot Form  Diabetic Foot exam was performed with the following findings:  Yes 06/08/2015  2:36 PM  Visual Inspection  No deformities, no ulcerations, no other skin breakdown bilaterally:  Yes  Sensation Testing  See comments:  Yes  Pulse Check  See comments:  Yes  Comments  Decreased sensation to microfilament on left foot, sensation intact to light touch in both feet, normal sensation to microfilament in right foot Diminished dorsalis pedis with loss of hair in both legs  Labs reviewed: Basic Metabolic Panel:  Recent Labs  11/13/14 0500  11/20/14 0500   12/01/14 1259 12/04/14 0500 12/06/14 0500  12/19/14 0501 12/19/14 0750 12/20/14 0413 12/22/14 0430 02/12/15 0636 03/20/15 1112 04/12/15 0616 04/23/15  NA 142  < > 136*  < > 136* 130* 131*  < >  --  140 138 136 136 141 141 142  K 4.5  < > 3.7  < > 4.6 4.3 3.9  < >  --  3.6 4.5 3.9 3.5 3.7 3.0* 3.5  CL 101  < > 97  < > 98 93* 92*  < >  --  101 101 101 98  --   --   --   CO2 21  < > 27  < > 25 24 24   < >  --  24 23 22   --   --   --   --   GLUCOSE 215*  < > 162*  < > 199* 122* 118*  < >  --  156* 188* 211* 179* 149* 158*  --   BUN 131*  < > 26*  < > 36* 53* 41*  < >  --  31* 43* 45* 73*  --   --  26*  CREATININE 3.73*  < > 2.94*  < > 3.65* 5.13* 4.19*  < >  --  4.42* 5.49* 5.06* 5.60*  --   --  4.7*  CALCIUM 8.6  8.3*  < > 8.3*  < > 8.7 8.9 8.7  < >  --  8.4 8.1* 8.6  --   --   --   --   MG 2.3  --  1.9  --   --   --   --   --  2.1  --   --   --   --   --   --   --   PHOS 5.6*  < > 4.8*  < > 2.7 1.7* 1.9*  --   --   --   --   --   --   --   --   --   < > = values in this interval not displayed. Liver Function Tests:  Recent Labs  11/06/14 0755  11/08/14 0714  11/13/14 0500  12/01/14 1259 12/04/14 0500 12/06/14 0500 04/23/15  AST 85*  --  45*  --  47*  --   --   --   --  28  ALT 65*  --  47*  --  74*  --   --   --   --  17  ALKPHOS 81  --  84  --  102  --   --   --   --  84  BILITOT <0.2*  --  <0.2*  --  0.3  --   --   --   --   --   PROT 6.0  --  5.8*  --  5.8*  --   --   --   --   --   ALBUMIN 2.4*  < > 3.1*  3.2*  < > 3.0*  < > 2.7* 2.8* 2.7*  --   < > = values in this interval not displayed. No results for input(s): LIPASE, AMYLASE in the last 8760 hours. No results for input(s): AMMONIA in the last 8760 hours. CBC:  Recent Labs  11/13/14 0500  11/20/14 0500  12/18/14 1918  12/20/14 1311 12/21/14 [id number] 12/22/14 0430 02/12/15 0636 03/20/15 1112 04/12/15 [id number]  WBC 9.3  < > 8.6  < > 9.5  < > 8.8 9.7 9.1  --   --   --   NEUTROABS 7.0  --  5.7  --  4.8  --   --   --    --   --   --   --   HGB 8.3*  < > 9.1*  < > 11.2*  < > 10.3* 10.6* 10.4* 12.6 10.9* 11.9*  HCT 26.2*  < > 29.9*  < > 37.6  < > 35.1* 36.1 35.3* 37.0 32.0* 35.0*  MCV 98.5  < > 101.4*  < > 98.2  < > 97.2 99.4 97.0  --   --   --   PLT 158  < > 177  < > 159  < > 182 177 197  --   --   --   < > = values in this interval not displayed. Cardiac Enzymes:  Recent Labs  11/25/14 0610  12/19/14 0501 12/19/14 0950 12/19/14 1605  CKTOTAL 11  --   --   --   --   TROPONINI  --   < > 0.09* 0.09* 0.08*  < > = values in this interval not displayed. BNP: Invalid input(s): POCBNP CBG:  Recent Labs  02/12/15 0853 03/20/15 1519 04/12/15 0951  GLUCAP 155* 124* 135*    Lab Results  Component Value Date   HGBA1C 5.0 04/23/2015    Assessment/Plan  Type 2 dm with ckd a1c 5. On levemir 10 u daily and SSI humalog. Reviewed cbg. With a1c suggestive of controlled dm, decrease levemir to 5 u and continue SSI and monitor. Monitor cbg. Continue HD 3 days a week. Continue aspirin and atorvastatin.   Diabetic neuropathy Has neuropathy with decreased sensation on foot exam. No foot corns/ callus or ulcer noted. Diabetic shoe can prevent these from occuring  PVD Diminished pulses, hair loss present s/o PVD. Has diabetes, esrd, CHF and HTN all of which increases risk for worsening of PVD  Filled out the diabetic shoes form  [name], MD  [facility] [phone] (Monday-Friday 8 am - 5 pm) [phone] (afterhours)

## 2015-06-11 DIAGNOSIS — D631 Anemia in chronic kidney disease: Secondary | ICD-10-CM | POA: Diagnosis not present

## 2015-06-11 DIAGNOSIS — D509 Iron deficiency anemia, unspecified: Secondary | ICD-10-CM | POA: Diagnosis not present

## 2015-06-11 DIAGNOSIS — E1122 Type 2 diabetes mellitus with diabetic chronic kidney disease: Secondary | ICD-10-CM | POA: Diagnosis not present

## 2015-06-11 DIAGNOSIS — N186 End stage renal disease: Secondary | ICD-10-CM | POA: Diagnosis not present

## 2015-06-13 DIAGNOSIS — D509 Iron deficiency anemia, unspecified: Secondary | ICD-10-CM | POA: Diagnosis not present

## 2015-06-13 DIAGNOSIS — E1122 Type 2 diabetes mellitus with diabetic chronic kidney disease: Secondary | ICD-10-CM | POA: Diagnosis not present

## 2015-06-13 DIAGNOSIS — D631 Anemia in chronic kidney disease: Secondary | ICD-10-CM | POA: Diagnosis not present

## 2015-06-13 DIAGNOSIS — N186 End stage renal disease: Secondary | ICD-10-CM | POA: Diagnosis not present

## 2015-06-14 DIAGNOSIS — K580 Irritable bowel syndrome with diarrhea: Secondary | ICD-10-CM | POA: Diagnosis not present

## 2015-06-15 DIAGNOSIS — D509 Iron deficiency anemia, unspecified: Secondary | ICD-10-CM | POA: Diagnosis not present

## 2015-06-15 DIAGNOSIS — N186 End stage renal disease: Secondary | ICD-10-CM | POA: Diagnosis not present

## 2015-06-15 DIAGNOSIS — D631 Anemia in chronic kidney disease: Secondary | ICD-10-CM | POA: Diagnosis not present

## 2015-06-15 DIAGNOSIS — E1122 Type 2 diabetes mellitus with diabetic chronic kidney disease: Secondary | ICD-10-CM | POA: Diagnosis not present

## 2015-06-18 DIAGNOSIS — D631 Anemia in chronic kidney disease: Secondary | ICD-10-CM | POA: Diagnosis not present

## 2015-06-18 DIAGNOSIS — D509 Iron deficiency anemia, unspecified: Secondary | ICD-10-CM | POA: Diagnosis not present

## 2015-06-18 DIAGNOSIS — N186 End stage renal disease: Secondary | ICD-10-CM | POA: Diagnosis not present

## 2015-06-18 DIAGNOSIS — E1122 Type 2 diabetes mellitus with diabetic chronic kidney disease: Secondary | ICD-10-CM | POA: Diagnosis not present

## 2015-06-19 ENCOUNTER — Encounter: Payer: Self-pay | Admitting: Internal Medicine

## 2015-06-20 DIAGNOSIS — D631 Anemia in chronic kidney disease: Secondary | ICD-10-CM | POA: Diagnosis not present

## 2015-06-20 DIAGNOSIS — E1122 Type 2 diabetes mellitus with diabetic chronic kidney disease: Secondary | ICD-10-CM | POA: Diagnosis not present

## 2015-06-20 DIAGNOSIS — N186 End stage renal disease: Secondary | ICD-10-CM | POA: Diagnosis not present

## 2015-06-20 DIAGNOSIS — D509 Iron deficiency anemia, unspecified: Secondary | ICD-10-CM | POA: Diagnosis not present

## 2015-06-21 DIAGNOSIS — Z992 Dependence on renal dialysis: Secondary | ICD-10-CM | POA: Diagnosis not present

## 2015-06-21 DIAGNOSIS — N186 End stage renal disease: Secondary | ICD-10-CM | POA: Diagnosis not present

## 2015-06-21 DIAGNOSIS — E1122 Type 2 diabetes mellitus with diabetic chronic kidney disease: Secondary | ICD-10-CM | POA: Diagnosis not present

## 2015-06-22 DIAGNOSIS — D631 Anemia in chronic kidney disease: Secondary | ICD-10-CM | POA: Diagnosis not present

## 2015-06-22 DIAGNOSIS — N186 End stage renal disease: Secondary | ICD-10-CM | POA: Diagnosis not present

## 2015-06-22 DIAGNOSIS — E1122 Type 2 diabetes mellitus with diabetic chronic kidney disease: Secondary | ICD-10-CM | POA: Diagnosis not present

## 2015-06-22 DIAGNOSIS — Z23 Encounter for immunization: Secondary | ICD-10-CM | POA: Diagnosis not present

## 2015-06-25 DIAGNOSIS — N186 End stage renal disease: Secondary | ICD-10-CM | POA: Diagnosis not present

## 2015-06-25 DIAGNOSIS — E1122 Type 2 diabetes mellitus with diabetic chronic kidney disease: Secondary | ICD-10-CM | POA: Diagnosis not present

## 2015-06-25 DIAGNOSIS — Z23 Encounter for immunization: Secondary | ICD-10-CM | POA: Diagnosis not present

## 2015-06-25 DIAGNOSIS — D631 Anemia in chronic kidney disease: Secondary | ICD-10-CM | POA: Diagnosis not present

## 2015-06-26 ENCOUNTER — Telehealth: Payer: Self-pay | Admitting: Internal Medicine

## 2015-06-26 NOTE — Telephone Encounter (Signed)
Dr. Hilarie Fredrickson have you seen any lab-stool results on this pt from [facility]?

## 2015-06-27 DIAGNOSIS — E1122 Type 2 diabetes mellitus with diabetic chronic kidney disease: Secondary | ICD-10-CM | POA: Diagnosis not present

## 2015-06-27 DIAGNOSIS — D631 Anemia in chronic kidney disease: Secondary | ICD-10-CM | POA: Diagnosis not present

## 2015-06-27 DIAGNOSIS — N186 End stage renal disease: Secondary | ICD-10-CM | POA: Diagnosis not present

## 2015-06-27 DIAGNOSIS — Z23 Encounter for immunization: Secondary | ICD-10-CM | POA: Diagnosis not present

## 2015-06-27 NOTE — Telephone Encounter (Signed)
i have not seen or received lab or stool records from SN[facility]

## 2015-06-28 NOTE — Telephone Encounter (Signed)
Stool studies reviewed, C. difficile PCR negative, ova and parasite negative, fecal lactoferrin is positive which indicates some degree of inflammation At last visit there was question of which laxatives and antidiarrhea meds she was receiving at her skilled facility If loose stools continue her MAR needs to be reviewed again. Imodium 2-4 mg daily be used once to twice daily as needed for loose stools

## 2015-06-28 NOTE — Telephone Encounter (Signed)
Labs placed in Dr. Vena Rua in box for review.

## 2015-06-28 NOTE — Telephone Encounter (Signed)
Call placed to [facility] for lab results.

## 2015-06-29 DIAGNOSIS — D631 Anemia in chronic kidney disease: Secondary | ICD-10-CM | POA: Diagnosis not present

## 2015-06-29 DIAGNOSIS — E1122 Type 2 diabetes mellitus with diabetic chronic kidney disease: Secondary | ICD-10-CM | POA: Diagnosis not present

## 2015-06-29 DIAGNOSIS — Z23 Encounter for immunization: Secondary | ICD-10-CM | POA: Diagnosis not present

## 2015-06-29 DIAGNOSIS — N186 End stage renal disease: Secondary | ICD-10-CM | POA: Diagnosis not present

## 2015-06-29 NOTE — Telephone Encounter (Signed)
Left message to call back.  Spoke with pts daughter and she is aware. Order faxed to [facility] regarding Imodium.

## 2015-07-02 ENCOUNTER — Telehealth: Payer: Self-pay | Admitting: Internal Medicine

## 2015-07-02 DIAGNOSIS — D631 Anemia in chronic kidney disease: Secondary | ICD-10-CM | POA: Diagnosis not present

## 2015-07-02 DIAGNOSIS — E1122 Type 2 diabetes mellitus with diabetic chronic kidney disease: Secondary | ICD-10-CM | POA: Diagnosis not present

## 2015-07-02 DIAGNOSIS — N186 End stage renal disease: Secondary | ICD-10-CM | POA: Diagnosis not present

## 2015-07-02 DIAGNOSIS — Z23 Encounter for immunization: Secondary | ICD-10-CM | POA: Diagnosis not present

## 2015-07-02 NOTE — Telephone Encounter (Signed)
Left message for daughter that the orders were faxed to [facility] 06/29/15 and for her to call back if they still cannot find them.

## 2015-07-03 ENCOUNTER — Telehealth: Payer: Self-pay | Admitting: Internal Medicine

## 2015-07-03 NOTE — Telephone Encounter (Signed)
Order for Imodium needs to state give 3-4 times daily and pt may refuse it if she does not need it.

## 2015-07-04 DIAGNOSIS — D631 Anemia in chronic kidney disease: Secondary | ICD-10-CM | POA: Diagnosis not present

## 2015-07-04 DIAGNOSIS — N186 End stage renal disease: Secondary | ICD-10-CM | POA: Diagnosis not present

## 2015-07-04 DIAGNOSIS — E1122 Type 2 diabetes mellitus with diabetic chronic kidney disease: Secondary | ICD-10-CM | POA: Diagnosis not present

## 2015-07-04 DIAGNOSIS — Z23 Encounter for immunization: Secondary | ICD-10-CM | POA: Diagnosis not present

## 2015-07-04 NOTE — Telephone Encounter (Signed)
Order faxed to [phone] stating pt to be given Imodium 2mg  tid.

## 2015-07-05 ENCOUNTER — Encounter: Payer: Self-pay | Admitting: Internal Medicine

## 2015-07-06 DIAGNOSIS — E1122 Type 2 diabetes mellitus with diabetic chronic kidney disease: Secondary | ICD-10-CM | POA: Diagnosis not present

## 2015-07-06 DIAGNOSIS — D631 Anemia in chronic kidney disease: Secondary | ICD-10-CM | POA: Diagnosis not present

## 2015-07-06 DIAGNOSIS — Z23 Encounter for immunization: Secondary | ICD-10-CM | POA: Diagnosis not present

## 2015-07-06 DIAGNOSIS — N186 End stage renal disease: Secondary | ICD-10-CM | POA: Diagnosis not present

## 2015-07-09 DIAGNOSIS — D631 Anemia in chronic kidney disease: Secondary | ICD-10-CM | POA: Diagnosis not present

## 2015-07-09 DIAGNOSIS — E1122 Type 2 diabetes mellitus with diabetic chronic kidney disease: Secondary | ICD-10-CM | POA: Diagnosis not present

## 2015-07-09 DIAGNOSIS — Z23 Encounter for immunization: Secondary | ICD-10-CM | POA: Diagnosis not present

## 2015-07-09 DIAGNOSIS — N186 End stage renal disease: Secondary | ICD-10-CM | POA: Diagnosis not present

## 2015-07-11 DIAGNOSIS — E1122 Type 2 diabetes mellitus with diabetic chronic kidney disease: Secondary | ICD-10-CM | POA: Diagnosis not present

## 2015-07-11 DIAGNOSIS — N186 End stage renal disease: Secondary | ICD-10-CM | POA: Diagnosis not present

## 2015-07-11 DIAGNOSIS — D631 Anemia in chronic kidney disease: Secondary | ICD-10-CM | POA: Diagnosis not present

## 2015-07-11 DIAGNOSIS — Z23 Encounter for immunization: Secondary | ICD-10-CM | POA: Diagnosis not present

## 2015-07-13 DIAGNOSIS — E1122 Type 2 diabetes mellitus with diabetic chronic kidney disease: Secondary | ICD-10-CM | POA: Diagnosis not present

## 2015-07-13 DIAGNOSIS — Z23 Encounter for immunization: Secondary | ICD-10-CM | POA: Diagnosis not present

## 2015-07-13 DIAGNOSIS — N186 End stage renal disease: Secondary | ICD-10-CM | POA: Diagnosis not present

## 2015-07-13 DIAGNOSIS — D631 Anemia in chronic kidney disease: Secondary | ICD-10-CM | POA: Diagnosis not present

## 2015-07-16 DIAGNOSIS — D631 Anemia in chronic kidney disease: Secondary | ICD-10-CM | POA: Diagnosis not present

## 2015-07-16 DIAGNOSIS — Z23 Encounter for immunization: Secondary | ICD-10-CM | POA: Diagnosis not present

## 2015-07-16 DIAGNOSIS — N186 End stage renal disease: Secondary | ICD-10-CM | POA: Diagnosis not present

## 2015-07-16 DIAGNOSIS — E1122 Type 2 diabetes mellitus with diabetic chronic kidney disease: Secondary | ICD-10-CM | POA: Diagnosis not present

## 2015-07-18 ENCOUNTER — Encounter: Payer: Self-pay | Admitting: Adult Health

## 2015-07-18 ENCOUNTER — Non-Acute Institutional Stay (SKILLED_NURSING_FACILITY): Payer: Medicare Other | Admitting: Adult Health

## 2015-07-18 DIAGNOSIS — D638 Anemia in other chronic diseases classified elsewhere: Secondary | ICD-10-CM | POA: Diagnosis not present

## 2015-07-18 DIAGNOSIS — I150 Renovascular hypertension: Secondary | ICD-10-CM | POA: Diagnosis not present

## 2015-07-18 DIAGNOSIS — K589 Irritable bowel syndrome without diarrhea: Secondary | ICD-10-CM

## 2015-07-18 DIAGNOSIS — F419 Anxiety disorder, unspecified: Secondary | ICD-10-CM | POA: Diagnosis not present

## 2015-07-18 DIAGNOSIS — K2990 Gastroduodenitis, unspecified, without bleeding: Secondary | ICD-10-CM

## 2015-07-18 DIAGNOSIS — E1129 Type 2 diabetes mellitus with other diabetic kidney complication: Secondary | ICD-10-CM | POA: Diagnosis not present

## 2015-07-18 DIAGNOSIS — Z992 Dependence on renal dialysis: Secondary | ICD-10-CM | POA: Diagnosis not present

## 2015-07-18 DIAGNOSIS — D631 Anemia in chronic kidney disease: Secondary | ICD-10-CM | POA: Diagnosis not present

## 2015-07-18 DIAGNOSIS — I953 Hypotension of hemodialysis: Secondary | ICD-10-CM

## 2015-07-18 DIAGNOSIS — N186 End stage renal disease: Secondary | ICD-10-CM | POA: Diagnosis not present

## 2015-07-18 DIAGNOSIS — E785 Hyperlipidemia, unspecified: Secondary | ICD-10-CM | POA: Diagnosis not present

## 2015-07-18 DIAGNOSIS — I480 Paroxysmal atrial fibrillation: Secondary | ICD-10-CM | POA: Diagnosis not present

## 2015-07-18 DIAGNOSIS — K2970 Gastritis, unspecified, without bleeding: Secondary | ICD-10-CM

## 2015-07-18 DIAGNOSIS — G4700 Insomnia, unspecified: Secondary | ICD-10-CM | POA: Diagnosis not present

## 2015-07-18 DIAGNOSIS — E1122 Type 2 diabetes mellitus with diabetic chronic kidney disease: Secondary | ICD-10-CM | POA: Diagnosis not present

## 2015-07-18 DIAGNOSIS — Z23 Encounter for immunization: Secondary | ICD-10-CM | POA: Diagnosis not present

## 2015-07-18 DIAGNOSIS — N058 Unspecified nephritic syndrome with other morphologic changes: Secondary | ICD-10-CM

## 2015-07-18 NOTE — Progress Notes (Signed)
Patient ID: [name], female   DOB: Aug 06, 1936, 79 y.o.   MRN: [id number]   07/18/2015  Facility:  Nursing Home Location:  [place] Room Number: 205-2 LEVEL OF CARE:  SNF (31)   Chief Complaint  Patient presents with  . Medical Management of Chronic Issues    PAF, hyperlipidemia, ESRD, anemia, history of gastroduodenitis, diabetes mellitus, insomnia, hypertension, anxiety and IBS    HISTORY OF PRESENT ILLNESS:  This is a 79 year old female who is being seen for a routine visit. She is a long-term resident  @ [facility]. Her stool occult blood was + and had a GI consult. However, patient and daughter decided to not do any procedure @ this time since patient is too weak.  Latest hgbA1c 5.0. She is currently taking Humalog sliding scale and Levemir for diabetes. Levemir was recently decreased to 5 units SQ daily. Medical history is significant for diabetes mellitus, anemia, diastolic CHF and hypertension. She has ESRD and currently having hemodialysis.    PAST MEDICAL HISTORY:  Past Medical History  Diagnosis Date  . CHF (congestive heart failure) 05/13/2011    echo - [id number]; stage 1 diastolic dysfunction; elevated LV filling pressure, MAC  . Chest pain, atypical 12/05/2008    R/Lmv- normal perfusion all regions, noe ECG changes   . Renal insufficiency 10/30/2005    doppler - abn resistance consistent w/ parenchymal disease  . Claudication 10/30/2005    doppler - normal evaluation, no evidence of aneurysm, diameter reduction, dissection, compression or vascular abnormality)  . DM (diabetes mellitus)   . Peripheral neuropathy   . Hypertension   . Edema   . Gout   . Anxiety   . Arthritis   . Cataract   . GERD (gastroesophageal reflux disease)   . Complication of anesthesia   . PONV (postoperative nausea and vomiting)   . Family history of adverse reaction to anesthesia     son also has nausea   . CKD (chronic kidney disease), stage III   .  Dyslipidemia   . Anemia   . Morbid obesity   . Atrial fibrillation   . Breast cancer 1980s    bil mastectomies, no radiation or chemo  . Renal cyst   . Barrett esophagus     CURRENT MEDICATIONS: Reviewed per MAR/see medication list  Allergies  Allergen Reactions  . Erythromycin Swelling  . Penicillins Other (See Comments)    Unknown allergic reaction.  She has taken keflex many times without problems  . Codeine Rash   REVIEW OF SYSTEMS:  GENERAL: no fatigue, no weight changes, no fever, chills  RESPIRATORY: no cough, SOB, DOE, wheezing, hemoptysis CARDIAC: no chest pain GI: no abdominal pain, diarrhea, constipation, heart burn, nausea or vomiting  PHYSICAL EXAMINATION  GENERAL: no acute distress, obese NECK: supple, trachea midline, no neck masses, no thyroid tenderness, no thyromegaly LYMPHATICS: no LAN in the neck, no supraclavicular LAN RESPIRATORY: breathing is even & unlabored, BS CTAB CARDIAC: RRR, no murmur,no extra heart sounds, BLE edema 2+ GI: abdomen soft, normal BS, no masses, no tenderness, no hepatomegaly, no splenomegaly EXTREMITIES: Able to move all 4 extremities; Left wrist AV fistula + for bruit and thrill PSYCHIATRIC: the patient is alert & oriented to person, affect & behavior appropriate  LABS/RADIOLOGY: Labs reviewed: Basic Metabolic Panel:  Recent Labs  11/13/14 0500  11/20/14 0500  12/01/14 1259 12/04/14 0500 12/06/14 0500  12/19/14 0501 12/19/14 0750 12/20/14 0413 12/22/14 0430 02/12/15 0636 03/20/15  1112 04/12/15 0616 04/23/15  NA 142  < > 136*  < > 136* 130* 131*  < >  --  140 138 136 136 141 141 142  K 4.5  < > 3.7  < > 4.6 4.3 3.9  < >  --  3.6 4.5 3.9 3.5 3.7 3.0* 3.5  CL 101  < > 97  < > 98 93* 92*  < >  --  101 101 101 98  --   --   --   CO2 21  < > 27  < > 25 24 24   < >  --  24 23 22   --   --   --   --   GLUCOSE 215*  < > 162*  < > 199* 122* 118*  < >  --  156* 188* 211* 179* 149* 158*  --   BUN 131*  < > 26*  < > 36* 53*  41*  < >  --  31* 43* 45* 73*  --   --  26*  CREATININE 3.73*  < > 2.94*  < > 3.65* 5.13* 4.19*  < >  --  4.42* 5.49* 5.06* 5.60*  --   --  4.7*  CALCIUM 8.6  8.3*  < > 8.3*  < > 8.7 8.9 8.7  < >  --  8.4 8.1* 8.6  --   --   --   --   MG 2.3  --  1.9  --   --   --   --   --  2.1  --   --   --   --   --   --   --   PHOS 5.6*  < > 4.8*  < > 2.7 1.7* 1.9*  --   --   --   --   --   --   --   --   --   < > = values in this interval not displayed. Liver Function Tests:  Recent Labs  11/06/14 0755  11/08/14 0714  11/13/14 0500  12/01/14 1259 12/04/14 0500 12/06/14 0500 04/23/15  AST 85*  --  45*  --  47*  --   --   --   --  28  ALT 65*  --  47*  --  74*  --   --   --   --  17  ALKPHOS 81  --  84  --  102  --   --   --   --  84  BILITOT <0.2*  --  <0.2*  --  0.3  --   --   --   --   --   PROT 6.0  --  5.8*  --  5.8*  --   --   --   --   --   ALBUMIN 2.4*  < > 3.1*  3.2*  < > 3.0*  < > 2.7* 2.8* 2.7*  --   < > = values in this interval not displayed.  CBC:  Recent Labs  11/13/14 0500  11/20/14 0500  12/18/14 1918  12/20/14 1311 12/21/14 0412 12/22/14 0430 02/12/15 0636 03/20/15 1112 04/12/15 0616  WBC 9.3  < > 8.6  < > 9.5  < > 8.8 9.7 9.1  --   --   --   NEUTROABS 7.0  --  5.7  --  4.8  --   --   --   --   --   --   --  HGB 8.3*  < > 9.1*  < > 11.2*  < > 10.3* 10.6* 10.4* 12.6 10.9* 11.9*  HCT 26.2*  < > 29.9*  < > 37.6  < > 35.1* 36.1 35.3* 37.0 32.0* 35.0*  MCV 98.5  < > 101.4*  < > 98.2  < > 97.2 99.4 97.0  --   --   --   PLT 158  < > 177  < > 159  < > 182 177 197  --   --   --   < > = values in this interval not displayed.  Cardiac Enzymes:  Recent Labs  11/25/14 0610  12/19/14 0501 12/19/14 0950 12/19/14 1605  CKTOTAL 11  --   --   --   --   TROPONINI  --   < > 0.09* 0.09* 0.08*  < > = values in this interval not displayed.  CBG:  Recent Labs  02/12/15 0853 03/20/15 1519 04/12/15 0951  GLUCAP 155* 124* 135*   No results  found.  ASSESSMENT/PLAN:  Atrial fibrillation - rate controlled; continue amiodarone 200 mg by mouth  daily and Lopressor 50 mg by mouth twice a day; aspirin 81 mg take 4 tabs = 324 mg by mouth daily, not a candidate for Coumadin   ESRD - on hemodialysis Mondays, Wednesdays and Fridays; continue fluid restriction 1800 mL/24 hour  Hyperlipidemia - continue Lipitor 80 mg 1 tab by mouth every 6 p.m.  Anemia of chronic disease - hemoglobin 10.2, stable; continue Aranesp 150 g/0.3 mL IV every Wednesdays during hemodialysis  Diabetes mellitus with renal complication - hemoglobin A1c 5.0; continue Levemir 10 units subcutaneous daily at bedtime and NovoLog sliding scale 3 times a day with meals  Insomnia - continue melatonin 6 mg by mouth daily at bedtime  Renovascular hypertension - well controlled; continue Lopressor 50 mg by mouth twice a day   Hemodialysis associated hypotension - continue midodrine 10 mg by mouth every Mondays, Wednesdays and Fridays (dialysis days)  Anxiety - mood is stable; continue Valium 2.5 mg by mouth daily at bedtime  Gastritis - recently decreased  Protonix to 40 mg by mouth PO Q D  IBS - recently increased Bentyl 20 mg 1 tab TID and Imodium 2 mg 1 tab PO TID  Vertigo - continue Antivert 12.5 mg PO Q 12 H PRN    Goals of care:  Long-term care     [facility], NP [facility] [phone]

## 2015-07-20 DIAGNOSIS — E1122 Type 2 diabetes mellitus with diabetic chronic kidney disease: Secondary | ICD-10-CM | POA: Diagnosis not present

## 2015-07-20 DIAGNOSIS — Z23 Encounter for immunization: Secondary | ICD-10-CM | POA: Diagnosis not present

## 2015-07-20 DIAGNOSIS — D631 Anemia in chronic kidney disease: Secondary | ICD-10-CM | POA: Diagnosis not present

## 2015-07-20 DIAGNOSIS — N186 End stage renal disease: Secondary | ICD-10-CM | POA: Diagnosis not present

## 2015-07-22 DIAGNOSIS — Z992 Dependence on renal dialysis: Secondary | ICD-10-CM | POA: Diagnosis not present

## 2015-07-22 DIAGNOSIS — E1122 Type 2 diabetes mellitus with diabetic chronic kidney disease: Secondary | ICD-10-CM | POA: Diagnosis not present

## 2015-07-22 DIAGNOSIS — N186 End stage renal disease: Secondary | ICD-10-CM | POA: Diagnosis not present

## 2015-07-23 DIAGNOSIS — E1122 Type 2 diabetes mellitus with diabetic chronic kidney disease: Secondary | ICD-10-CM | POA: Diagnosis not present

## 2015-07-23 DIAGNOSIS — N186 End stage renal disease: Secondary | ICD-10-CM | POA: Diagnosis not present

## 2015-07-23 DIAGNOSIS — E119 Type 2 diabetes mellitus without complications: Secondary | ICD-10-CM | POA: Diagnosis not present

## 2015-07-25 DIAGNOSIS — N186 End stage renal disease: Secondary | ICD-10-CM | POA: Diagnosis not present

## 2015-07-25 DIAGNOSIS — E1122 Type 2 diabetes mellitus with diabetic chronic kidney disease: Secondary | ICD-10-CM | POA: Diagnosis not present

## 2015-07-27 DIAGNOSIS — E1122 Type 2 diabetes mellitus with diabetic chronic kidney disease: Secondary | ICD-10-CM | POA: Diagnosis not present

## 2015-07-27 DIAGNOSIS — N186 End stage renal disease: Secondary | ICD-10-CM | POA: Diagnosis not present

## 2015-07-30 DIAGNOSIS — N186 End stage renal disease: Secondary | ICD-10-CM | POA: Diagnosis not present

## 2015-07-30 DIAGNOSIS — E1122 Type 2 diabetes mellitus with diabetic chronic kidney disease: Secondary | ICD-10-CM | POA: Diagnosis not present

## 2015-08-01 DIAGNOSIS — N186 End stage renal disease: Secondary | ICD-10-CM | POA: Diagnosis not present

## 2015-08-01 DIAGNOSIS — E1122 Type 2 diabetes mellitus with diabetic chronic kidney disease: Secondary | ICD-10-CM | POA: Diagnosis not present

## 2015-08-03 DIAGNOSIS — E1122 Type 2 diabetes mellitus with diabetic chronic kidney disease: Secondary | ICD-10-CM | POA: Diagnosis not present

## 2015-08-03 DIAGNOSIS — N186 End stage renal disease: Secondary | ICD-10-CM | POA: Diagnosis not present

## 2015-08-06 DIAGNOSIS — E1122 Type 2 diabetes mellitus with diabetic chronic kidney disease: Secondary | ICD-10-CM | POA: Diagnosis not present

## 2015-08-06 DIAGNOSIS — N186 End stage renal disease: Secondary | ICD-10-CM | POA: Diagnosis not present

## 2015-08-08 DIAGNOSIS — N186 End stage renal disease: Secondary | ICD-10-CM | POA: Diagnosis not present

## 2015-08-08 DIAGNOSIS — E1122 Type 2 diabetes mellitus with diabetic chronic kidney disease: Secondary | ICD-10-CM | POA: Diagnosis not present

## 2015-08-09 ENCOUNTER — Encounter: Payer: Self-pay | Admitting: Internal Medicine

## 2015-08-09 ENCOUNTER — Ambulatory Visit (INDEPENDENT_AMBULATORY_CARE_PROVIDER_SITE_OTHER): Payer: Medicare Other | Admitting: Internal Medicine

## 2015-08-09 VITALS — BP 104/60 | HR 62 | Ht 64.5 in

## 2015-08-09 DIAGNOSIS — R1084 Generalized abdominal pain: Secondary | ICD-10-CM

## 2015-08-09 DIAGNOSIS — K2990 Gastroduodenitis, unspecified, without bleeding: Secondary | ICD-10-CM | POA: Diagnosis not present

## 2015-08-09 DIAGNOSIS — K589 Irritable bowel syndrome without diarrhea: Secondary | ICD-10-CM | POA: Diagnosis not present

## 2015-08-09 DIAGNOSIS — Z992 Dependence on renal dialysis: Secondary | ICD-10-CM

## 2015-08-09 DIAGNOSIS — R195 Other fecal abnormalities: Secondary | ICD-10-CM | POA: Diagnosis not present

## 2015-08-09 DIAGNOSIS — R110 Nausea: Secondary | ICD-10-CM | POA: Diagnosis not present

## 2015-08-09 DIAGNOSIS — N186 End stage renal disease: Secondary | ICD-10-CM

## 2015-08-09 MED ORDER — SUCRALFATE 1 GM/10ML PO SUSP: 1.0000 g | Freq: Three times a day (TID) | ORAL | Status: DC

## 2015-08-09 MED ORDER — RIFAXIMIN 550 MG PO TABS: 550.0000 mg | ORAL_TABLET | Freq: Three times a day (TID) | ORAL | Status: DC

## 2015-08-09 NOTE — Progress Notes (Signed)
Subjective:    Patient ID: [name], female    DOB: 1936/11/01, 79 y.o.   MRN: [id number]  HPI [name] is a 79 year old female with past medical history of Barrett's esophagus, GERD, gastritis, FOBT positive normocytic anemia, COPD, diabetes, ESRD on HD, remote breast cancer who is here for follow-up. She is here with her daughter today. She was last seen in June 2016.  She reports that she continues to struggle with appetite. On occasion when she eats she has nausea but rarely vomiting. She is still continuing to have loose stools which seem to be triggered by dietary intake. She is unaware of certain triggers but feels likes potatoes and watermelon makes her have loose stools. She's noted some abdominal cramping on occasion which seems to be worse after dialysis. She has been avoiding food at times because it can cause loose stools and abdominal cramping. She has lost about 10 pounds approximately in the last 2 months. She continues on hemodialysis on Monday Wednesday Friday. She continues on home oxygen 2 L 24 hours a day.  There is been issues with medication dosing at her skilled nursing facility. Her daughter explains that she had a hard time having senna removed from her medication list because reportedly "all patients get laxative". The patient reports occasionally senna will be offered to her in the morning but she refuses because she knows what the pill looks like. She states she is taking Imodium after several faxed orders from our office. Is currently written for loperamide 2 mg 3 times a day. There are days that she refuses all the doses and other days when she takes 2 or 3 doses. She is not having loose stools every day but several days per week. She denies seeing blood in her stool and she reports the nursing assistants and nurses have not seen blood in her stool recently. She reports her hemoglobin has been stable according to her dialysis team with hemoglobin in the upper 10  range. After checked frequently at dialysis.  Since last visit her pantoprazole was decreased to once daily to see if this was contributing to loose stools. She's continued use Zofran on most days for nausea. Bentyl has been scheduled and she feels she is taking it 20 mg 3 times daily. She feels it has helped.  I reviewed the note from [place] the NP at her SNF.  Her recent issues are listed as A. fib on amiodarone, metoprolol and aspirin. End-stage renal disease on dialysis, hyperlipidemia, anemia of chronic disease, diabetes with renal complications, renovascular hypertension, hemodialysis associated hypotension, anxiety, gastritis, IBS and vertigo   Review of Systems As per history of present illness, otherwise negative  Current Medications, Allergies, Past Medical History, Past Surgical History, Family History and Social History were reviewed in [employer] record.     Objective:   Physical Exam BP 104/60 mmHg  Pulse 62  Ht 5' 4.5" (1.638 m) Constitutional: Well-developed and well-nourished chronically ill-appearing female. No distress. In wheelchair HEENT: Normocephalic and atraumatic. Conjunctivae are normal.  No scleral icterus. Neck: Neck supple. Trachea midline. Cardiovascular: Normal rate, regular rhythm and intact distal pulses. Pulmonary/chest: Effort normal and breath sounds normal. No wheezing, rales or rhonchi. Abdominal: Soft, nontender, nondistended. Bowel sounds active throughout.  Extremities: no clubbing, cyanosis, trace pretibial edema Neurological: Alert and oriented to person place and time. Skin: Skin is warm and dry. No rashes noted. Psychiatric: Normal mood and affect. Behavior is normal.  CT scan abdomen  and pelvis without contrast January 2016 -- no evidence of bowel obstruction, moderate colonic stool burden. 2 nonobstructing kidney stones on the left, no hydronephrosis. Bilateral renal cysts of varying sizes in  complexities likely benign. No CT findings to account for abdominal pain nausea or inability to eat.  EGD 10/24/2014 -- short segment Barrett's esophagus without dysplasia, mild reflux esophagitis. Mild gastritis in the gastric antrum. Fundic gland appearing polyps in the proximal stomach. Moderate duodenitis. Biopsies from the stomach showed chronic inactive gastritis with reactive epithelial changes. No Helicobacter, dysplasia or cancer. Duodenal biopsies consistent with peptic duodenitis.  Stool testing since last visit -- negative C. difficile, negative O&P, negative bacterial culture, positive fecal leukocytes    Assessment & Plan:   79 year old female with past medical history of Barrett's esophagus, GERD, gastritis, FOBT positive normocytic anemia, COPD, diabetes, ESRD on HD, remote breast cancer who is here for follow-up.  1. Anorexia/post-prandial abd pain/nausea/loose stools/hx of heme + stools -- ongoing issues with GI both upper and lower gastrointestinal symptoms likely multifactorial. History of gastroduodenitis and certainly this could still be an issue. She has been on PPI will continue pantoprazole or any milligrams once daily. Previous great response to Carafate which did not seem to help on retrial. Will try liquid Carafate to see if this would benefit her , 1 g before meals. I would like her to continue taking Bentyl 20 mg 3 times daily. For loose stools, will try rifaximin 550 mg 3 times a day 14 days. She can continue Imodium 2 mg 3 times daily. I would like her to take this at least once daily to avoid loose stools which are bothersome to her at her skilled facility. We discussed further evaluation with colonoscopy and while this is technically possible she remains weak and debilitated and I'm not sure the test is currently worth the risk for her. Both she and her daughter are in agreement and understand. We can revisit this based on her overall condition and she responds to new  therapy to be started today.  Would like to see her back in 2-3 months, sooner if necessary. There is no evidence for progressive anemia or active bleeding.  I also feel that it is possible that she could have an element of mesenteric ischemia exacerbated by hemodialysis sessions which is likely contributing to intermittent abdominal discomfort, pain and possibly even loose stools.  2. ESRD -- per nephrology

## 2015-08-09 NOTE — Patient Instructions (Addendum)
Please take your Bentyl as currently prescribed (20 mg three times daily).  Take Imodium 2 mg three times daily. You must take at least 1 dosage per day. You may refuse the other 2 doses if they are not needed.  Continue pantoprazole 40 mg daily.  Please start taking carafate liquid 1 gram three times daily before meals. We have provided a prescription for this.  We have sent a prescription to [facility] for Xifaxan 550 mg three times daily x 14 days. You should hear from them within the next several days.  Please follow up with Dr Hilarie Fredrickson in 3 months.

## 2015-08-10 ENCOUNTER — Telehealth: Payer: Self-pay | Admitting: *Deleted

## 2015-08-10 ENCOUNTER — Other Ambulatory Visit: Payer: Self-pay | Admitting: Internal Medicine

## 2015-08-10 DIAGNOSIS — R195 Other fecal abnormalities: Secondary | ICD-10-CM

## 2015-08-10 DIAGNOSIS — N186 End stage renal disease: Secondary | ICD-10-CM | POA: Diagnosis not present

## 2015-08-10 DIAGNOSIS — E1122 Type 2 diabetes mellitus with diabetic chronic kidney disease: Secondary | ICD-10-CM | POA: Diagnosis not present

## 2015-08-10 DIAGNOSIS — R109 Unspecified abdominal pain: Secondary | ICD-10-CM

## 2015-08-10 NOTE — Telephone Encounter (Signed)
-----   Message -----    From: Jerene Bears, MD    Sent: 08/09/2015   5:59 PM      To: Larina Bras, [place]  Fax note to skilled nursing facility [facility]) and also hold colon spot for her on my next available hospital Tuesday am

## 2015-08-10 NOTE — Telephone Encounter (Signed)
Colonoscopy has been scheduled for Tuesday, 08/28/15 @ 11:15 am. We can cancel if needed. Patient will need to be instructed on procedure prep.

## 2015-08-13 DIAGNOSIS — N186 End stage renal disease: Secondary | ICD-10-CM | POA: Diagnosis not present

## 2015-08-13 DIAGNOSIS — E1122 Type 2 diabetes mellitus with diabetic chronic kidney disease: Secondary | ICD-10-CM | POA: Diagnosis not present

## 2015-08-14 DIAGNOSIS — I10 Essential (primary) hypertension: Secondary | ICD-10-CM | POA: Diagnosis not present

## 2015-08-15 DIAGNOSIS — N186 End stage renal disease: Secondary | ICD-10-CM | POA: Diagnosis not present

## 2015-08-15 DIAGNOSIS — E1122 Type 2 diabetes mellitus with diabetic chronic kidney disease: Secondary | ICD-10-CM | POA: Diagnosis not present

## 2015-08-16 ENCOUNTER — Telehealth: Payer: Self-pay | Admitting: *Deleted

## 2015-08-16 ENCOUNTER — Encounter (HOSPITAL_COMMUNITY): Payer: Self-pay | Admitting: *Deleted

## 2015-08-16 ENCOUNTER — Telehealth: Payer: Self-pay | Admitting: Internal Medicine

## 2015-08-16 NOTE — Telephone Encounter (Signed)
Per [facility], patient's [name] has been approved. Since patient is [place] Medicaid, they are unable to fill rx. However, they will send rx to [facility] in [place], [place] ([place] uses this pharmacy)

## 2015-08-16 NOTE — Telephone Encounter (Signed)
Procedure rescheduled to 09/25/15@11am . Daughter and [facility] aware of new appt. New prep instructions mailed to daughter and faxed to [facility] at [phone].

## 2015-08-17 ENCOUNTER — Telehealth: Payer: Self-pay | Admitting: Internal Medicine

## 2015-08-17 DIAGNOSIS — N186 End stage renal disease: Secondary | ICD-10-CM | POA: Diagnosis not present

## 2015-08-17 DIAGNOSIS — E1122 Type 2 diabetes mellitus with diabetic chronic kidney disease: Secondary | ICD-10-CM | POA: Diagnosis not present

## 2015-08-17 NOTE — Telephone Encounter (Signed)
I called for [name] [facility] and she was on break. I have asked that she call back. Patient's Xifaxan was sent to [facility] on purpose since they are able to get rx approved. However, since [facility] does not work with Medicaid, they were going to send rx to [facility] (see note from yesterday).

## 2015-08-17 NOTE — Telephone Encounter (Signed)
Spoke to [place] and issue has been resolved.

## 2015-08-20 DIAGNOSIS — E1122 Type 2 diabetes mellitus with diabetic chronic kidney disease: Secondary | ICD-10-CM | POA: Diagnosis not present

## 2015-08-20 DIAGNOSIS — N186 End stage renal disease: Secondary | ICD-10-CM | POA: Diagnosis not present

## 2015-08-21 ENCOUNTER — Telehealth: Payer: Self-pay | Admitting: Internal Medicine

## 2015-08-21 NOTE — Telephone Encounter (Signed)
Spoke with pts daughter and let her know the papers were faxed to [phone], she will let [place] know.

## 2015-08-22 DIAGNOSIS — N186 End stage renal disease: Secondary | ICD-10-CM | POA: Diagnosis not present

## 2015-08-22 DIAGNOSIS — Z992 Dependence on renal dialysis: Secondary | ICD-10-CM | POA: Diagnosis not present

## 2015-08-22 DIAGNOSIS — E1122 Type 2 diabetes mellitus with diabetic chronic kidney disease: Secondary | ICD-10-CM | POA: Diagnosis not present

## 2015-08-23 ENCOUNTER — Telehealth: Payer: Self-pay | Admitting: Internal Medicine

## 2015-08-23 NOTE — Telephone Encounter (Signed)
[facility] called to let Dr. Hilarie Fredrickson know that the pt is refusing to take her carafate, Imodium, and her Xifaxan. Pt states that the xifaxan is causing diarrhea. Dr. Hilarie Fredrickson notified.

## 2015-08-24 DIAGNOSIS — Z23 Encounter for immunization: Secondary | ICD-10-CM | POA: Diagnosis not present

## 2015-08-24 DIAGNOSIS — N186 End stage renal disease: Secondary | ICD-10-CM | POA: Diagnosis not present

## 2015-08-24 DIAGNOSIS — E1122 Type 2 diabetes mellitus with diabetic chronic kidney disease: Secondary | ICD-10-CM | POA: Diagnosis not present

## 2015-08-24 DIAGNOSIS — D509 Iron deficiency anemia, unspecified: Secondary | ICD-10-CM | POA: Diagnosis not present

## 2015-08-27 DIAGNOSIS — N186 End stage renal disease: Secondary | ICD-10-CM | POA: Diagnosis not present

## 2015-08-27 DIAGNOSIS — E1122 Type 2 diabetes mellitus with diabetic chronic kidney disease: Secondary | ICD-10-CM | POA: Diagnosis not present

## 2015-08-27 DIAGNOSIS — D509 Iron deficiency anemia, unspecified: Secondary | ICD-10-CM | POA: Diagnosis not present

## 2015-08-27 DIAGNOSIS — Z23 Encounter for immunization: Secondary | ICD-10-CM | POA: Diagnosis not present

## 2015-08-29 DIAGNOSIS — N186 End stage renal disease: Secondary | ICD-10-CM | POA: Diagnosis not present

## 2015-08-29 DIAGNOSIS — E1122 Type 2 diabetes mellitus with diabetic chronic kidney disease: Secondary | ICD-10-CM | POA: Diagnosis not present

## 2015-08-29 DIAGNOSIS — D509 Iron deficiency anemia, unspecified: Secondary | ICD-10-CM | POA: Diagnosis not present

## 2015-08-29 DIAGNOSIS — Z23 Encounter for immunization: Secondary | ICD-10-CM | POA: Diagnosis not present

## 2015-08-31 DIAGNOSIS — Z23 Encounter for immunization: Secondary | ICD-10-CM | POA: Diagnosis not present

## 2015-08-31 DIAGNOSIS — N186 End stage renal disease: Secondary | ICD-10-CM | POA: Diagnosis not present

## 2015-08-31 DIAGNOSIS — D509 Iron deficiency anemia, unspecified: Secondary | ICD-10-CM | POA: Diagnosis not present

## 2015-08-31 DIAGNOSIS — E1122 Type 2 diabetes mellitus with diabetic chronic kidney disease: Secondary | ICD-10-CM | POA: Diagnosis not present

## 2015-09-03 DIAGNOSIS — D509 Iron deficiency anemia, unspecified: Secondary | ICD-10-CM | POA: Diagnosis not present

## 2015-09-03 DIAGNOSIS — E1122 Type 2 diabetes mellitus with diabetic chronic kidney disease: Secondary | ICD-10-CM | POA: Diagnosis not present

## 2015-09-03 DIAGNOSIS — Z23 Encounter for immunization: Secondary | ICD-10-CM | POA: Diagnosis not present

## 2015-09-03 DIAGNOSIS — N186 End stage renal disease: Secondary | ICD-10-CM | POA: Diagnosis not present

## 2015-09-05 DIAGNOSIS — N186 End stage renal disease: Secondary | ICD-10-CM | POA: Diagnosis not present

## 2015-09-05 DIAGNOSIS — E1122 Type 2 diabetes mellitus with diabetic chronic kidney disease: Secondary | ICD-10-CM | POA: Diagnosis not present

## 2015-09-05 DIAGNOSIS — D509 Iron deficiency anemia, unspecified: Secondary | ICD-10-CM | POA: Diagnosis not present

## 2015-09-05 DIAGNOSIS — Z23 Encounter for immunization: Secondary | ICD-10-CM | POA: Diagnosis not present

## 2015-09-06 ENCOUNTER — Telehealth: Payer: Self-pay | Admitting: Internal Medicine

## 2015-09-06 NOTE — Telephone Encounter (Signed)
Pts daughter states pt wants to cancel appt for Colon, does not want to proceed with this. Appt cancelled. Also want to know if Dr. Hilarie Fredrickson has tested her mother for exocrine pancreatic insufficiency. States she has been doing some research and wonders with all the bloating her mother has if this might be worth looking at. Please advise.

## 2015-09-06 NOTE — Telephone Encounter (Signed)
Will reorder test. Daughter states that the pt does not want to have the colon, she is not too weak.

## 2015-09-06 NOTE — Telephone Encounter (Signed)
Great question Fecal elastase is the stool test for this.  Is was ordered in June 2016, but I do not see that it resulted. It can and should be reordered Is she simply too weak for the procedure?

## 2015-09-07 DIAGNOSIS — D509 Iron deficiency anemia, unspecified: Secondary | ICD-10-CM | POA: Diagnosis not present

## 2015-09-07 DIAGNOSIS — E1122 Type 2 diabetes mellitus with diabetic chronic kidney disease: Secondary | ICD-10-CM | POA: Diagnosis not present

## 2015-09-07 DIAGNOSIS — N186 End stage renal disease: Secondary | ICD-10-CM | POA: Diagnosis not present

## 2015-09-07 DIAGNOSIS — Z23 Encounter for immunization: Secondary | ICD-10-CM | POA: Diagnosis not present

## 2015-09-10 DIAGNOSIS — N186 End stage renal disease: Secondary | ICD-10-CM | POA: Diagnosis not present

## 2015-09-10 DIAGNOSIS — E1122 Type 2 diabetes mellitus with diabetic chronic kidney disease: Secondary | ICD-10-CM | POA: Diagnosis not present

## 2015-09-10 DIAGNOSIS — Z23 Encounter for immunization: Secondary | ICD-10-CM | POA: Diagnosis not present

## 2015-09-10 DIAGNOSIS — D509 Iron deficiency anemia, unspecified: Secondary | ICD-10-CM | POA: Diagnosis not present

## 2015-09-12 DIAGNOSIS — N186 End stage renal disease: Secondary | ICD-10-CM | POA: Diagnosis not present

## 2015-09-12 DIAGNOSIS — E1122 Type 2 diabetes mellitus with diabetic chronic kidney disease: Secondary | ICD-10-CM | POA: Diagnosis not present

## 2015-09-12 DIAGNOSIS — D509 Iron deficiency anemia, unspecified: Secondary | ICD-10-CM | POA: Diagnosis not present

## 2015-09-12 DIAGNOSIS — Z23 Encounter for immunization: Secondary | ICD-10-CM | POA: Diagnosis not present

## 2015-09-13 ENCOUNTER — Other Ambulatory Visit: Payer: Self-pay

## 2015-09-13 DIAGNOSIS — R197 Diarrhea, unspecified: Secondary | ICD-10-CM

## 2015-09-14 DIAGNOSIS — N186 End stage renal disease: Secondary | ICD-10-CM | POA: Diagnosis not present

## 2015-09-14 DIAGNOSIS — Z23 Encounter for immunization: Secondary | ICD-10-CM | POA: Diagnosis not present

## 2015-09-14 DIAGNOSIS — D509 Iron deficiency anemia, unspecified: Secondary | ICD-10-CM | POA: Diagnosis not present

## 2015-09-14 DIAGNOSIS — E1122 Type 2 diabetes mellitus with diabetic chronic kidney disease: Secondary | ICD-10-CM | POA: Diagnosis not present

## 2015-09-17 DIAGNOSIS — N186 End stage renal disease: Secondary | ICD-10-CM | POA: Diagnosis not present

## 2015-09-17 DIAGNOSIS — K2900 Acute gastritis without bleeding: Secondary | ICD-10-CM | POA: Diagnosis not present

## 2015-09-17 DIAGNOSIS — D509 Iron deficiency anemia, unspecified: Secondary | ICD-10-CM | POA: Diagnosis not present

## 2015-09-17 DIAGNOSIS — Z23 Encounter for immunization: Secondary | ICD-10-CM | POA: Diagnosis not present

## 2015-09-17 DIAGNOSIS — E1122 Type 2 diabetes mellitus with diabetic chronic kidney disease: Secondary | ICD-10-CM | POA: Diagnosis not present

## 2015-09-19 DIAGNOSIS — D509 Iron deficiency anemia, unspecified: Secondary | ICD-10-CM | POA: Diagnosis not present

## 2015-09-19 DIAGNOSIS — N186 End stage renal disease: Secondary | ICD-10-CM | POA: Diagnosis not present

## 2015-09-19 DIAGNOSIS — E1122 Type 2 diabetes mellitus with diabetic chronic kidney disease: Secondary | ICD-10-CM | POA: Diagnosis not present

## 2015-09-19 DIAGNOSIS — Z23 Encounter for immunization: Secondary | ICD-10-CM | POA: Diagnosis not present

## 2015-09-21 DIAGNOSIS — Z23 Encounter for immunization: Secondary | ICD-10-CM | POA: Diagnosis not present

## 2015-09-21 DIAGNOSIS — N186 End stage renal disease: Secondary | ICD-10-CM | POA: Diagnosis not present

## 2015-09-21 DIAGNOSIS — Z992 Dependence on renal dialysis: Secondary | ICD-10-CM | POA: Diagnosis not present

## 2015-09-21 DIAGNOSIS — E1122 Type 2 diabetes mellitus with diabetic chronic kidney disease: Secondary | ICD-10-CM | POA: Diagnosis not present

## 2015-09-21 DIAGNOSIS — D509 Iron deficiency anemia, unspecified: Secondary | ICD-10-CM | POA: Diagnosis not present

## 2015-09-24 DIAGNOSIS — D509 Iron deficiency anemia, unspecified: Secondary | ICD-10-CM | POA: Diagnosis not present

## 2015-09-24 DIAGNOSIS — N186 End stage renal disease: Secondary | ICD-10-CM | POA: Diagnosis not present

## 2015-09-24 DIAGNOSIS — D631 Anemia in chronic kidney disease: Secondary | ICD-10-CM | POA: Diagnosis not present

## 2015-09-24 DIAGNOSIS — E1122 Type 2 diabetes mellitus with diabetic chronic kidney disease: Secondary | ICD-10-CM | POA: Diagnosis not present

## 2015-09-25 ENCOUNTER — Ambulatory Visit (HOSPITAL_COMMUNITY): Admission: RE | Admit: 2015-09-25 | Payer: Medicare Other | Source: Ambulatory Visit | Admitting: Internal Medicine

## 2015-09-25 HISTORY — DX: Cardiac arrhythmia, unspecified: I49.9

## 2015-09-25 SURGERY — COLONOSCOPY WITH PROPOFOL
Anesthesia: Monitor Anesthesia Care

## 2015-09-26 DIAGNOSIS — D509 Iron deficiency anemia, unspecified: Secondary | ICD-10-CM | POA: Diagnosis not present

## 2015-09-26 DIAGNOSIS — D631 Anemia in chronic kidney disease: Secondary | ICD-10-CM | POA: Diagnosis not present

## 2015-09-26 DIAGNOSIS — N186 End stage renal disease: Secondary | ICD-10-CM | POA: Diagnosis not present

## 2015-09-26 DIAGNOSIS — E1122 Type 2 diabetes mellitus with diabetic chronic kidney disease: Secondary | ICD-10-CM | POA: Diagnosis not present

## 2015-09-28 DIAGNOSIS — E1122 Type 2 diabetes mellitus with diabetic chronic kidney disease: Secondary | ICD-10-CM | POA: Diagnosis not present

## 2015-09-28 DIAGNOSIS — D509 Iron deficiency anemia, unspecified: Secondary | ICD-10-CM | POA: Diagnosis not present

## 2015-09-28 DIAGNOSIS — D631 Anemia in chronic kidney disease: Secondary | ICD-10-CM | POA: Diagnosis not present

## 2015-09-28 DIAGNOSIS — N186 End stage renal disease: Secondary | ICD-10-CM | POA: Diagnosis not present

## 2015-10-01 DIAGNOSIS — N186 End stage renal disease: Secondary | ICD-10-CM | POA: Diagnosis not present

## 2015-10-01 DIAGNOSIS — D631 Anemia in chronic kidney disease: Secondary | ICD-10-CM | POA: Diagnosis not present

## 2015-10-01 DIAGNOSIS — D509 Iron deficiency anemia, unspecified: Secondary | ICD-10-CM | POA: Diagnosis not present

## 2015-10-01 DIAGNOSIS — E1122 Type 2 diabetes mellitus with diabetic chronic kidney disease: Secondary | ICD-10-CM | POA: Diagnosis not present

## 2015-10-03 DIAGNOSIS — D631 Anemia in chronic kidney disease: Secondary | ICD-10-CM | POA: Diagnosis not present

## 2015-10-03 DIAGNOSIS — N186 End stage renal disease: Secondary | ICD-10-CM | POA: Diagnosis not present

## 2015-10-03 DIAGNOSIS — D509 Iron deficiency anemia, unspecified: Secondary | ICD-10-CM | POA: Diagnosis not present

## 2015-10-03 DIAGNOSIS — E1122 Type 2 diabetes mellitus with diabetic chronic kidney disease: Secondary | ICD-10-CM | POA: Diagnosis not present

## 2015-10-05 DIAGNOSIS — D631 Anemia in chronic kidney disease: Secondary | ICD-10-CM | POA: Diagnosis not present

## 2015-10-05 DIAGNOSIS — D509 Iron deficiency anemia, unspecified: Secondary | ICD-10-CM | POA: Diagnosis not present

## 2015-10-05 DIAGNOSIS — E1122 Type 2 diabetes mellitus with diabetic chronic kidney disease: Secondary | ICD-10-CM | POA: Diagnosis not present

## 2015-10-05 DIAGNOSIS — N186 End stage renal disease: Secondary | ICD-10-CM | POA: Diagnosis not present

## 2015-10-08 ENCOUNTER — Other Ambulatory Visit: Payer: Self-pay | Admitting: *Deleted

## 2015-10-08 DIAGNOSIS — E1122 Type 2 diabetes mellitus with diabetic chronic kidney disease: Secondary | ICD-10-CM | POA: Diagnosis not present

## 2015-10-08 DIAGNOSIS — D509 Iron deficiency anemia, unspecified: Secondary | ICD-10-CM | POA: Diagnosis not present

## 2015-10-08 DIAGNOSIS — D631 Anemia in chronic kidney disease: Secondary | ICD-10-CM | POA: Diagnosis not present

## 2015-10-08 DIAGNOSIS — N186 End stage renal disease: Secondary | ICD-10-CM | POA: Diagnosis not present

## 2015-10-08 MED ORDER — DIAZEPAM 5 MG PO TABS: ORAL_TABLET | ORAL | Status: DC

## 2015-10-08 NOTE — Telephone Encounter (Signed)
[facility] 

## 2015-10-10 DIAGNOSIS — N186 End stage renal disease: Secondary | ICD-10-CM | POA: Diagnosis not present

## 2015-10-10 DIAGNOSIS — E1122 Type 2 diabetes mellitus with diabetic chronic kidney disease: Secondary | ICD-10-CM | POA: Diagnosis not present

## 2015-10-10 DIAGNOSIS — D631 Anemia in chronic kidney disease: Secondary | ICD-10-CM | POA: Diagnosis not present

## 2015-10-10 DIAGNOSIS — D509 Iron deficiency anemia, unspecified: Secondary | ICD-10-CM | POA: Diagnosis not present

## 2015-10-12 DIAGNOSIS — D631 Anemia in chronic kidney disease: Secondary | ICD-10-CM | POA: Diagnosis not present

## 2015-10-12 DIAGNOSIS — N186 End stage renal disease: Secondary | ICD-10-CM | POA: Diagnosis not present

## 2015-10-12 DIAGNOSIS — D509 Iron deficiency anemia, unspecified: Secondary | ICD-10-CM | POA: Diagnosis not present

## 2015-10-12 DIAGNOSIS — E1122 Type 2 diabetes mellitus with diabetic chronic kidney disease: Secondary | ICD-10-CM | POA: Diagnosis not present

## 2015-10-15 DIAGNOSIS — D509 Iron deficiency anemia, unspecified: Secondary | ICD-10-CM | POA: Diagnosis not present

## 2015-10-15 DIAGNOSIS — D631 Anemia in chronic kidney disease: Secondary | ICD-10-CM | POA: Diagnosis not present

## 2015-10-15 DIAGNOSIS — N186 End stage renal disease: Secondary | ICD-10-CM | POA: Diagnosis not present

## 2015-10-15 DIAGNOSIS — E1122 Type 2 diabetes mellitus with diabetic chronic kidney disease: Secondary | ICD-10-CM | POA: Diagnosis not present

## 2015-10-17 DIAGNOSIS — D509 Iron deficiency anemia, unspecified: Secondary | ICD-10-CM | POA: Diagnosis not present

## 2015-10-17 DIAGNOSIS — D631 Anemia in chronic kidney disease: Secondary | ICD-10-CM | POA: Diagnosis not present

## 2015-10-17 DIAGNOSIS — E1122 Type 2 diabetes mellitus with diabetic chronic kidney disease: Secondary | ICD-10-CM | POA: Diagnosis not present

## 2015-10-17 DIAGNOSIS — N186 End stage renal disease: Secondary | ICD-10-CM | POA: Diagnosis not present

## 2015-10-19 DIAGNOSIS — D631 Anemia in chronic kidney disease: Secondary | ICD-10-CM | POA: Diagnosis not present

## 2015-10-19 DIAGNOSIS — E1122 Type 2 diabetes mellitus with diabetic chronic kidney disease: Secondary | ICD-10-CM | POA: Diagnosis not present

## 2015-10-19 DIAGNOSIS — D509 Iron deficiency anemia, unspecified: Secondary | ICD-10-CM | POA: Diagnosis not present

## 2015-10-19 DIAGNOSIS — N186 End stage renal disease: Secondary | ICD-10-CM | POA: Diagnosis not present

## 2015-10-22 DIAGNOSIS — N186 End stage renal disease: Secondary | ICD-10-CM | POA: Diagnosis not present

## 2015-10-22 DIAGNOSIS — D631 Anemia in chronic kidney disease: Secondary | ICD-10-CM | POA: Diagnosis not present

## 2015-10-22 DIAGNOSIS — Z992 Dependence on renal dialysis: Secondary | ICD-10-CM | POA: Diagnosis not present

## 2015-10-22 DIAGNOSIS — D509 Iron deficiency anemia, unspecified: Secondary | ICD-10-CM | POA: Diagnosis not present

## 2015-10-22 DIAGNOSIS — E1122 Type 2 diabetes mellitus with diabetic chronic kidney disease: Secondary | ICD-10-CM | POA: Diagnosis not present

## 2015-10-24 DIAGNOSIS — D631 Anemia in chronic kidney disease: Secondary | ICD-10-CM | POA: Diagnosis not present

## 2015-10-24 DIAGNOSIS — D509 Iron deficiency anemia, unspecified: Secondary | ICD-10-CM | POA: Diagnosis not present

## 2015-10-24 DIAGNOSIS — E1122 Type 2 diabetes mellitus with diabetic chronic kidney disease: Secondary | ICD-10-CM | POA: Diagnosis not present

## 2015-10-24 DIAGNOSIS — N186 End stage renal disease: Secondary | ICD-10-CM | POA: Diagnosis not present

## 2015-10-26 DIAGNOSIS — E1122 Type 2 diabetes mellitus with diabetic chronic kidney disease: Secondary | ICD-10-CM | POA: Diagnosis not present

## 2015-10-26 DIAGNOSIS — N186 End stage renal disease: Secondary | ICD-10-CM | POA: Diagnosis not present

## 2015-10-26 DIAGNOSIS — D631 Anemia in chronic kidney disease: Secondary | ICD-10-CM | POA: Diagnosis not present

## 2015-10-26 DIAGNOSIS — D509 Iron deficiency anemia, unspecified: Secondary | ICD-10-CM | POA: Diagnosis not present

## 2015-10-29 DIAGNOSIS — D631 Anemia in chronic kidney disease: Secondary | ICD-10-CM | POA: Diagnosis not present

## 2015-10-29 DIAGNOSIS — N186 End stage renal disease: Secondary | ICD-10-CM | POA: Diagnosis not present

## 2015-10-29 DIAGNOSIS — D509 Iron deficiency anemia, unspecified: Secondary | ICD-10-CM | POA: Diagnosis not present

## 2015-10-29 DIAGNOSIS — E1122 Type 2 diabetes mellitus with diabetic chronic kidney disease: Secondary | ICD-10-CM | POA: Diagnosis not present

## 2015-10-31 DIAGNOSIS — D509 Iron deficiency anemia, unspecified: Secondary | ICD-10-CM | POA: Diagnosis not present

## 2015-10-31 DIAGNOSIS — N186 End stage renal disease: Secondary | ICD-10-CM | POA: Diagnosis not present

## 2015-10-31 DIAGNOSIS — E1122 Type 2 diabetes mellitus with diabetic chronic kidney disease: Secondary | ICD-10-CM | POA: Diagnosis not present

## 2015-10-31 DIAGNOSIS — D631 Anemia in chronic kidney disease: Secondary | ICD-10-CM | POA: Diagnosis not present

## 2015-11-02 DIAGNOSIS — N186 End stage renal disease: Secondary | ICD-10-CM | POA: Diagnosis not present

## 2015-11-02 DIAGNOSIS — D509 Iron deficiency anemia, unspecified: Secondary | ICD-10-CM | POA: Diagnosis not present

## 2015-11-02 DIAGNOSIS — E1122 Type 2 diabetes mellitus with diabetic chronic kidney disease: Secondary | ICD-10-CM | POA: Diagnosis not present

## 2015-11-02 DIAGNOSIS — D631 Anemia in chronic kidney disease: Secondary | ICD-10-CM | POA: Diagnosis not present

## 2015-11-05 DIAGNOSIS — E1122 Type 2 diabetes mellitus with diabetic chronic kidney disease: Secondary | ICD-10-CM | POA: Diagnosis not present

## 2015-11-05 DIAGNOSIS — N186 End stage renal disease: Secondary | ICD-10-CM | POA: Diagnosis not present

## 2015-11-05 DIAGNOSIS — D631 Anemia in chronic kidney disease: Secondary | ICD-10-CM | POA: Diagnosis not present

## 2015-11-05 DIAGNOSIS — D509 Iron deficiency anemia, unspecified: Secondary | ICD-10-CM | POA: Diagnosis not present

## 2015-11-07 DIAGNOSIS — E1122 Type 2 diabetes mellitus with diabetic chronic kidney disease: Secondary | ICD-10-CM | POA: Diagnosis not present

## 2015-11-07 DIAGNOSIS — D509 Iron deficiency anemia, unspecified: Secondary | ICD-10-CM | POA: Diagnosis not present

## 2015-11-07 DIAGNOSIS — D631 Anemia in chronic kidney disease: Secondary | ICD-10-CM | POA: Diagnosis not present

## 2015-11-07 DIAGNOSIS — N186 End stage renal disease: Secondary | ICD-10-CM | POA: Diagnosis not present

## 2015-11-09 DIAGNOSIS — N186 End stage renal disease: Secondary | ICD-10-CM | POA: Diagnosis not present

## 2015-11-09 DIAGNOSIS — E1122 Type 2 diabetes mellitus with diabetic chronic kidney disease: Secondary | ICD-10-CM | POA: Diagnosis not present

## 2015-11-09 DIAGNOSIS — D509 Iron deficiency anemia, unspecified: Secondary | ICD-10-CM | POA: Diagnosis not present

## 2015-11-09 DIAGNOSIS — D631 Anemia in chronic kidney disease: Secondary | ICD-10-CM | POA: Diagnosis not present

## 2015-11-12 DIAGNOSIS — D631 Anemia in chronic kidney disease: Secondary | ICD-10-CM | POA: Diagnosis not present

## 2015-11-12 DIAGNOSIS — D509 Iron deficiency anemia, unspecified: Secondary | ICD-10-CM | POA: Diagnosis not present

## 2015-11-12 DIAGNOSIS — E1122 Type 2 diabetes mellitus with diabetic chronic kidney disease: Secondary | ICD-10-CM | POA: Diagnosis not present

## 2015-11-12 DIAGNOSIS — N186 End stage renal disease: Secondary | ICD-10-CM | POA: Diagnosis not present

## 2015-11-13 ENCOUNTER — Ambulatory Visit (INDEPENDENT_AMBULATORY_CARE_PROVIDER_SITE_OTHER): Payer: Medicare Other | Admitting: Internal Medicine

## 2015-11-13 ENCOUNTER — Encounter: Payer: Self-pay | Admitting: Internal Medicine

## 2015-11-13 VITALS — BP 124/52 | HR 64 | Ht 63.0 in | Wt 180.0 lb

## 2015-11-13 DIAGNOSIS — R630 Anorexia: Secondary | ICD-10-CM

## 2015-11-13 DIAGNOSIS — K529 Noninfective gastroenteritis and colitis, unspecified: Secondary | ICD-10-CM | POA: Diagnosis not present

## 2015-11-13 DIAGNOSIS — Z992 Dependence on renal dialysis: Secondary | ICD-10-CM

## 2015-11-13 DIAGNOSIS — N186 End stage renal disease: Secondary | ICD-10-CM | POA: Diagnosis not present

## 2015-11-13 DIAGNOSIS — D649 Anemia, unspecified: Secondary | ICD-10-CM

## 2015-11-13 NOTE — Patient Instructions (Addendum)
We have given orders for pancreatic fecal elastace to be completed.  Take imodium 2-4 mg every day for diarrhea.   Discontinue Senna.  Continue scheduled Bentyl 20 mg three times daily.  You have been scheduled to see Dr Hilarie Fredrickson in follow up on Tuesday, 01/15/16 @ 2:00 pm.

## 2015-11-13 NOTE — Progress Notes (Signed)
Subjective:    Patient ID: [name], female    DOB: [birth year], 78 y.o.   MRN: [id number]  HPI [name] [facility] is a 79 year old female with history of Barrett's esophagus, GERD, gastritis, FOBT positive normocytic anemia, COPD, diabetes, ESRD on HD and remote breast cancer who is here for follow-up. She's here today with her daughter and was last seen in August 2016. She had colonoscopy scheduled for October with this was canceled by patient and her daughter because she felt she was too weak to undergo colonoscopy. Colonoscopy had been recommended to evaluate ongoing issues with diarrhea and remotely heme positive stool.  She feels that on the whole she is doing better. She still has "spells of diarrhea". This happens 2-3 days per week. Is associated with lower abdominal cramping and then "explosive" evacuation. She still feels like nurses at her living facility try to give her senna which she reports she refuses. She is taking Imodium 4 mg on dialysis days and this controls her loose stools. She reports her appetite on the whole is decreased and she has lost about 8 pounds. She denies dysphagia. Some occasional nausea and she reports if she eats too quickly she can have vomiting. She denies hematemesis, melena or rectal bleeding. She is taking Bentyl 20 mg 3 times a day, loperamide as previously mentioned, and pantoprazole 40 mg daily.   Review of Systems As per history of present illness, otherwise negative  Current Medications, Allergies, Past Medical History, Past Surgical History, Family History and Social History were reviewed in [employer] record.     Objective:   Physical Exam BP 124/52 mmHg  Pulse 64  Ht 5\' 3"  (1.6 m)  Wt 180 lb (81.647 kg)  BMI 31.89 kg/m2 Constitutional: Chronic-ill-appearing female in no acute distress, in wheelchair HEENT: Normocephalic and atraumatic.Conjunctivae are normal.  No scleral icterus. Neck: Neck supple. Trachea  midline. Cardiovascular: Normal rate, regular rhythm and intact distal pulses.  Pulmonary/chest: Effort normal and breath sounds normal. No wheezing, rales or rhonchi. Abdominal: Soft, obese, nontender, nondistended. Bowel sounds active throughout.  Extremities: no clubbing, cyanosis, trace pretibial edema Neurological: Alert and oriented to person place and time. Psychiatric: Normal mood and affect. Behavior is normal.  Fecal fat spot -- neg.  CT scan abdomen and pelvis without contrast January 2016 -- no evidence of bowel obstruction, moderate colonic stool burden. 2 nonobstructing kidney stones on the left, no hydronephrosis. Bilateral renal cysts of varying sizes in complexities likely benign. No CT findings to account for abdominal pain nausea or inability to eat.  EGD 10/24/2014 -- short segment Barrett's esophagus without dysplasia, mild reflux esophagitis. Mild gastritis in the gastric antrum. Fundic gland appearing polyps in the proximal stomach. Moderate duodenitis. Biopsies from the stomach showed chronic inactive gastritis with reactive epithelial changes. No Helicobacter, dysplasia or cancer. Duodenal biopsies consistent with peptic duodenitis.       Assessment & Plan:  79 year old female with history of Barrett's esophagus, GERD, gastritis, FOBT positive normocytic anemia, COPD, diabetes, ESRD on HD and remote breast cancer who is here for follow-up.  1. Intermittent diarrhea/loose stools -- no definite trigger. Attempt at collecting fecal elastase has been unsuccessful. I do want to rule out pancreatic insufficiency and so we will reorder this test. I'm going to put her on scheduled Imodium 2-4 mg each morning. Continue Bentyl 20 mg 3 times daily. We have previously discussed colonoscopy which she continues to defer. Given her overall medical comorbidities I understand this decision.  2. Nausea/decreased appetite -- history of gastritis. On daily PPI. Small more frequent meals  recommended. Meal supplements such as boost or ensure recommended.  3. Anemia -- normocytic and patient on hemodialysis. Being followed closely by nephrology and receiving epo supplementation. Hemoglobin recently has been stable. No overt evidence of bleeding  3-4 month followup  25 minutes spent with the patient today. Greater than 50% was spent in counseling and coordination of care with the patient

## 2015-11-14 DIAGNOSIS — E1122 Type 2 diabetes mellitus with diabetic chronic kidney disease: Secondary | ICD-10-CM | POA: Diagnosis not present

## 2015-11-14 DIAGNOSIS — D631 Anemia in chronic kidney disease: Secondary | ICD-10-CM | POA: Diagnosis not present

## 2015-11-14 DIAGNOSIS — N186 End stage renal disease: Secondary | ICD-10-CM | POA: Diagnosis not present

## 2015-11-14 DIAGNOSIS — D509 Iron deficiency anemia, unspecified: Secondary | ICD-10-CM | POA: Diagnosis not present

## 2015-11-17 DIAGNOSIS — D631 Anemia in chronic kidney disease: Secondary | ICD-10-CM | POA: Diagnosis not present

## 2015-11-17 DIAGNOSIS — D509 Iron deficiency anemia, unspecified: Secondary | ICD-10-CM | POA: Diagnosis not present

## 2015-11-17 DIAGNOSIS — E1122 Type 2 diabetes mellitus with diabetic chronic kidney disease: Secondary | ICD-10-CM | POA: Diagnosis not present

## 2015-11-17 DIAGNOSIS — N186 End stage renal disease: Secondary | ICD-10-CM | POA: Diagnosis not present

## 2015-11-19 DIAGNOSIS — E1122 Type 2 diabetes mellitus with diabetic chronic kidney disease: Secondary | ICD-10-CM | POA: Diagnosis not present

## 2015-11-19 DIAGNOSIS — D631 Anemia in chronic kidney disease: Secondary | ICD-10-CM | POA: Diagnosis not present

## 2015-11-19 DIAGNOSIS — N186 End stage renal disease: Secondary | ICD-10-CM | POA: Diagnosis not present

## 2015-11-19 DIAGNOSIS — D509 Iron deficiency anemia, unspecified: Secondary | ICD-10-CM | POA: Diagnosis not present

## 2015-11-21 DIAGNOSIS — E1122 Type 2 diabetes mellitus with diabetic chronic kidney disease: Secondary | ICD-10-CM | POA: Diagnosis not present

## 2015-11-21 DIAGNOSIS — D631 Anemia in chronic kidney disease: Secondary | ICD-10-CM | POA: Diagnosis not present

## 2015-11-21 DIAGNOSIS — N186 End stage renal disease: Secondary | ICD-10-CM | POA: Diagnosis not present

## 2015-11-21 DIAGNOSIS — D509 Iron deficiency anemia, unspecified: Secondary | ICD-10-CM | POA: Diagnosis not present

## 2015-11-21 DIAGNOSIS — Z992 Dependence on renal dialysis: Secondary | ICD-10-CM | POA: Diagnosis not present

## 2015-11-23 DIAGNOSIS — D509 Iron deficiency anemia, unspecified: Secondary | ICD-10-CM | POA: Diagnosis not present

## 2015-11-23 DIAGNOSIS — E1122 Type 2 diabetes mellitus with diabetic chronic kidney disease: Secondary | ICD-10-CM | POA: Diagnosis not present

## 2015-11-23 DIAGNOSIS — D631 Anemia in chronic kidney disease: Secondary | ICD-10-CM | POA: Diagnosis not present

## 2015-11-23 DIAGNOSIS — N186 End stage renal disease: Secondary | ICD-10-CM | POA: Diagnosis not present

## 2015-11-26 DIAGNOSIS — R197 Diarrhea, unspecified: Secondary | ICD-10-CM | POA: Diagnosis not present

## 2015-11-26 DIAGNOSIS — D509 Iron deficiency anemia, unspecified: Secondary | ICD-10-CM | POA: Diagnosis not present

## 2015-11-26 DIAGNOSIS — E1122 Type 2 diabetes mellitus with diabetic chronic kidney disease: Secondary | ICD-10-CM | POA: Diagnosis not present

## 2015-11-26 DIAGNOSIS — N186 End stage renal disease: Secondary | ICD-10-CM | POA: Diagnosis not present

## 2015-11-26 DIAGNOSIS — D631 Anemia in chronic kidney disease: Secondary | ICD-10-CM | POA: Diagnosis not present

## 2015-11-28 DIAGNOSIS — D631 Anemia in chronic kidney disease: Secondary | ICD-10-CM | POA: Diagnosis not present

## 2015-11-28 DIAGNOSIS — N186 End stage renal disease: Secondary | ICD-10-CM | POA: Diagnosis not present

## 2015-11-28 DIAGNOSIS — E1122 Type 2 diabetes mellitus with diabetic chronic kidney disease: Secondary | ICD-10-CM | POA: Diagnosis not present

## 2015-11-28 DIAGNOSIS — D509 Iron deficiency anemia, unspecified: Secondary | ICD-10-CM | POA: Diagnosis not present

## 2015-11-30 DIAGNOSIS — E1122 Type 2 diabetes mellitus with diabetic chronic kidney disease: Secondary | ICD-10-CM | POA: Diagnosis not present

## 2015-11-30 DIAGNOSIS — D631 Anemia in chronic kidney disease: Secondary | ICD-10-CM | POA: Diagnosis not present

## 2015-11-30 DIAGNOSIS — D509 Iron deficiency anemia, unspecified: Secondary | ICD-10-CM | POA: Diagnosis not present

## 2015-11-30 DIAGNOSIS — N186 End stage renal disease: Secondary | ICD-10-CM | POA: Diagnosis not present

## 2015-12-03 DIAGNOSIS — D509 Iron deficiency anemia, unspecified: Secondary | ICD-10-CM | POA: Diagnosis not present

## 2015-12-03 DIAGNOSIS — N186 End stage renal disease: Secondary | ICD-10-CM | POA: Diagnosis not present

## 2015-12-03 DIAGNOSIS — D631 Anemia in chronic kidney disease: Secondary | ICD-10-CM | POA: Diagnosis not present

## 2015-12-03 DIAGNOSIS — E1122 Type 2 diabetes mellitus with diabetic chronic kidney disease: Secondary | ICD-10-CM | POA: Diagnosis not present

## 2015-12-05 ENCOUNTER — Non-Acute Institutional Stay (SKILLED_NURSING_FACILITY): Payer: Medicare Other | Admitting: Adult Health

## 2015-12-05 ENCOUNTER — Telehealth: Payer: Self-pay | Admitting: *Deleted

## 2015-12-05 DIAGNOSIS — N186 End stage renal disease: Secondary | ICD-10-CM | POA: Diagnosis not present

## 2015-12-05 DIAGNOSIS — K2990 Gastroduodenitis, unspecified, without bleeding: Secondary | ICD-10-CM

## 2015-12-05 DIAGNOSIS — K2970 Gastritis, unspecified, without bleeding: Secondary | ICD-10-CM | POA: Diagnosis not present

## 2015-12-05 DIAGNOSIS — D638 Anemia in other chronic diseases classified elsewhere: Secondary | ICD-10-CM | POA: Diagnosis not present

## 2015-12-05 DIAGNOSIS — Z992 Dependence on renal dialysis: Secondary | ICD-10-CM

## 2015-12-05 DIAGNOSIS — I953 Hypotension of hemodialysis: Secondary | ICD-10-CM | POA: Diagnosis not present

## 2015-12-05 DIAGNOSIS — E785 Hyperlipidemia, unspecified: Secondary | ICD-10-CM | POA: Diagnosis not present

## 2015-12-05 DIAGNOSIS — R42 Dizziness and giddiness: Secondary | ICD-10-CM

## 2015-12-05 DIAGNOSIS — F419 Anxiety disorder, unspecified: Secondary | ICD-10-CM

## 2015-12-05 DIAGNOSIS — I150 Renovascular hypertension: Secondary | ICD-10-CM

## 2015-12-05 DIAGNOSIS — K589 Irritable bowel syndrome without diarrhea: Secondary | ICD-10-CM

## 2015-12-05 DIAGNOSIS — I480 Paroxysmal atrial fibrillation: Secondary | ICD-10-CM

## 2015-12-05 DIAGNOSIS — Z794 Long term (current) use of insulin: Secondary | ICD-10-CM

## 2015-12-05 DIAGNOSIS — N184 Chronic kidney disease, stage 4 (severe): Secondary | ICD-10-CM

## 2015-12-05 DIAGNOSIS — D631 Anemia in chronic kidney disease: Secondary | ICD-10-CM | POA: Diagnosis not present

## 2015-12-05 DIAGNOSIS — G4700 Insomnia, unspecified: Secondary | ICD-10-CM

## 2015-12-05 DIAGNOSIS — E1122 Type 2 diabetes mellitus with diabetic chronic kidney disease: Secondary | ICD-10-CM

## 2015-12-05 DIAGNOSIS — D509 Iron deficiency anemia, unspecified: Secondary | ICD-10-CM | POA: Diagnosis not present

## 2015-12-05 NOTE — Telephone Encounter (Signed)
Dr Hilarie Fredrickson has received patient's pancreatic fecal elastace which was >500 (normal range > 200). Per Dr Hilarie Fredrickson, "normal result. Argues against pancreatic insufficiency as cause for diarrhea. Creon likely NOT beneficial." I have spoken to patient and her daughter, [name] [facility] (per patient's request) to advise of this. They will follow previous recommendations and keep follow up appointment with Dr Hilarie Fredrickson in January.

## 2015-12-06 DIAGNOSIS — E786 Lipoprotein deficiency: Secondary | ICD-10-CM | POA: Diagnosis not present

## 2015-12-06 DIAGNOSIS — K2900 Acute gastritis without bleeding: Secondary | ICD-10-CM | POA: Diagnosis not present

## 2015-12-06 DIAGNOSIS — R197 Diarrhea, unspecified: Secondary | ICD-10-CM | POA: Diagnosis not present

## 2015-12-06 LAB — BASIC METABOLIC PANEL
BUN: 12 mg/dL (ref 4–21)
BUN: 12 mg/dL (ref 4–21)
CREATININE: 2.1 mg/dL — AB (ref 0.5–1.1)
Creatinine: 2.1 mg/dL — AB (ref 0.5–1.1)
Glucose: 89 mg/dL
Glucose: 89 mg/dL
POTASSIUM: 3.9 mmol/L (ref 3.4–5.3)
POTASSIUM: 3.9 mmol/L (ref 3.4–5.3)
Sodium: 143 mmol/L (ref 137–147)
Sodium: 143 mmol/L (ref 137–147)

## 2015-12-06 LAB — HEPATIC FUNCTION PANEL
ALT: 25 U/L (ref 7–35)
ALT: 25 U/L (ref 7–35)
AST: 33 U/L (ref 13–35)
AST: 33 U/L (ref 13–35)
Alkaline Phosphatase: 103 U/L (ref 25–125)
Alkaline Phosphatase: 103 U/L (ref 25–125)
BILIRUBIN, TOTAL: 0.4 mg/dL
Bilirubin, Total: 0.4 mg/dL

## 2015-12-06 LAB — CBC AND DIFFERENTIAL
HEMATOCRIT: 32 % — AB (ref 36–46)
HEMATOCRIT: 32 % — AB (ref 36–46)
HEMOGLOBIN: 9.5 g/dL — AB (ref 12.0–16.0)
HEMOGLOBIN: 9.5 g/dL — AB (ref 12.0–16.0)
Neutrophils Absolute: 1 /uL
Neutrophils Absolute: 1 /uL
PLATELETS: 125 10*3/uL — AB (ref 150–399)
Platelets: 125 10*3/uL — AB (ref 150–399)
WBC: 4.1 10*3/mL
WBC: 4.1 10*3/mL

## 2015-12-06 LAB — LIPID PANEL
CHOLESTEROL: 77 mg/dL (ref 0–200)
Cholesterol: 77 mg/dL (ref 0–200)
HDL: 30 mg/dL — AB (ref 35–70)
HDL: 30 mg/dL — AB (ref 35–70)
LDL CALC: 26 mg/dL
LDL Cholesterol: 26 mg/dL
TRIGLYCERIDES: 103 mg/dL (ref 40–160)
Triglycerides: 103 mg/dL (ref 40–160)

## 2015-12-07 DIAGNOSIS — D631 Anemia in chronic kidney disease: Secondary | ICD-10-CM | POA: Diagnosis not present

## 2015-12-07 DIAGNOSIS — D509 Iron deficiency anemia, unspecified: Secondary | ICD-10-CM | POA: Diagnosis not present

## 2015-12-07 DIAGNOSIS — N186 End stage renal disease: Secondary | ICD-10-CM | POA: Diagnosis not present

## 2015-12-07 DIAGNOSIS — E1122 Type 2 diabetes mellitus with diabetic chronic kidney disease: Secondary | ICD-10-CM | POA: Diagnosis not present

## 2015-12-10 DIAGNOSIS — E1122 Type 2 diabetes mellitus with diabetic chronic kidney disease: Secondary | ICD-10-CM | POA: Diagnosis not present

## 2015-12-10 DIAGNOSIS — D509 Iron deficiency anemia, unspecified: Secondary | ICD-10-CM | POA: Diagnosis not present

## 2015-12-10 DIAGNOSIS — N186 End stage renal disease: Secondary | ICD-10-CM | POA: Diagnosis not present

## 2015-12-10 DIAGNOSIS — D631 Anemia in chronic kidney disease: Secondary | ICD-10-CM | POA: Diagnosis not present

## 2015-12-12 DIAGNOSIS — N186 End stage renal disease: Secondary | ICD-10-CM | POA: Diagnosis not present

## 2015-12-12 DIAGNOSIS — D631 Anemia in chronic kidney disease: Secondary | ICD-10-CM | POA: Diagnosis not present

## 2015-12-12 DIAGNOSIS — E1122 Type 2 diabetes mellitus with diabetic chronic kidney disease: Secondary | ICD-10-CM | POA: Diagnosis not present

## 2015-12-12 DIAGNOSIS — D509 Iron deficiency anemia, unspecified: Secondary | ICD-10-CM | POA: Diagnosis not present

## 2015-12-14 DIAGNOSIS — N186 End stage renal disease: Secondary | ICD-10-CM | POA: Diagnosis not present

## 2015-12-14 DIAGNOSIS — E1122 Type 2 diabetes mellitus with diabetic chronic kidney disease: Secondary | ICD-10-CM | POA: Diagnosis not present

## 2015-12-14 DIAGNOSIS — D509 Iron deficiency anemia, unspecified: Secondary | ICD-10-CM | POA: Diagnosis not present

## 2015-12-14 DIAGNOSIS — D631 Anemia in chronic kidney disease: Secondary | ICD-10-CM | POA: Diagnosis not present

## 2015-12-17 ENCOUNTER — Encounter: Payer: Self-pay | Admitting: Adult Health

## 2015-12-17 DIAGNOSIS — E1122 Type 2 diabetes mellitus with diabetic chronic kidney disease: Secondary | ICD-10-CM | POA: Diagnosis not present

## 2015-12-17 DIAGNOSIS — N186 End stage renal disease: Secondary | ICD-10-CM | POA: Diagnosis not present

## 2015-12-17 DIAGNOSIS — D631 Anemia in chronic kidney disease: Secondary | ICD-10-CM | POA: Diagnosis not present

## 2015-12-17 DIAGNOSIS — D509 Iron deficiency anemia, unspecified: Secondary | ICD-10-CM | POA: Diagnosis not present

## 2015-12-17 NOTE — Progress Notes (Signed)
Patient ID: [name] James, female   DOB: 01-06-1936, 78 y.o.   MRN: [id number]     DATE:     12/05/15  Facility:  Nursing Home Location:  [place] Room Number: 205-2 LEVEL OF CARE:  SNF (31)   Chief Complaint  Patient presents with  . Medical Management of Chronic Issues    PAF, hyperlipidemia, ESRD, anemia, history of gastroduodenitis, diabetes mellitus, insomnia, hypertension, anxiety and IBS    HISTORY OF PRESENT ILLNESS:  This is a 79 year old female who is being seen for a routine visit. She is a long-term resident  @ [facility].  Her latest hgbA1c is 5.1. Her Levemir was discontinued and currently on Humalog sliding scale. Latest hgb is 9.1 and currently on Aranesp @ hemodialysis on Wednesdays.    PAST MEDICAL HISTORY:  Past Medical History  Diagnosis Date  . CHF (congestive heart failure) ([place]) 05/13/2011    echo - [id number]; stage 1 diastolic dysfunction; elevated LV filling pressure, MAC  . Chest pain, atypical 12/05/2008    R/Lmv- normal perfusion all regions, noe ECG changes   . Claudication ([place]) 10/30/2005    doppler - normal evaluation, no evidence of aneurysm, diameter reduction, dissection, compression or vascular abnormality)  . DM (diabetes mellitus) ([place])   . Peripheral neuropathy ([place])   . Hypertension   . Edema   . Gout   . Anxiety   . Cataract   . GERD (gastroesophageal reflux disease)   . Complication of anesthesia   . PONV (postoperative nausea and vomiting)   . Family history of adverse reaction to anesthesia     son also has nausea   . Dyslipidemia   . Anemia   . Morbid obesity ([place])   . Atrial fibrillation ([place])   . Barrett esophagus   . Renal insufficiency 10/30/2005    doppler - abn resistance consistent w/ parenchymal disease  . CKD (chronic kidney disease), stage III   . Renal cyst   . Arthritis     knees  . Breast cancer ([place]) 1980s    bil mastectomies, no radiation or chemo  . Dysrhythmia     Atrial  Fibrillation    CURRENT MEDICATIONS: Reviewed per MAR/see medication list   Medication List       This list is accurate as of: 12/05/15 11:59 PM.  Always use your most recent med list.               amiodarone 200 MG tablet  Commonly known as:  PACERONE  Take 200 mg by mouth every morning.     aspirin 325 MG tablet  Take 325 mg by mouth daily.     atorvastatin 80 MG tablet  Commonly known as:  LIPITOR  Take 1 tablet (80 mg total) by mouth daily at 6 PM.     DECUBI-VITE Caps  Take by mouth. Take 1 capsule by mouth daily for wound healing     diazepam 5 MG tablet  Commonly known as:  VALIUM  Take 1/2 tablet by mouth once at bedtime for rest     dicyclomine 20 MG tablet  Commonly known as:  BENTYL  Take 1 tablet (20 mg total) by mouth 3 (three) times daily.     dorzolamide-timolol 22.3-6.8 MG/ML ophthalmic solution  Commonly known as:  COSOPT  Place 1 drop into the left eye 2 (two) times daily.     folic acid 1 MG tablet  Commonly known as:  [employer]  Take 1 mg by mouth daily.     insulin lispro 100 UNIT/ML injection  Commonly known as:  HUMALOG  Inject 0-12 Units into the skin 3 (three) times daily with meals. Per sliding scale:  150-200 2 units, 201-250 4 units, 251-300 6 units, 301-350 8 units, 351-400 10 units, >400 give 12 units and call MD     loperamide 2 MG tablet  Commonly known as:  IMODIUM A-D  Take 2 mg by mouth 4 (four) times daily as needed for diarrhea or loose stools.     loratadine 10 MG tablet  Commonly known as:  CLARITIN  Take 10 mg by mouth daily as needed for allergies.     meclizine 12.5 MG tablet  Commonly known as:  ANTIVERT  Take 12.5 mg by mouth every 12 (twelve) hours as needed for dizziness.     Melatonin 5 MG Tabs  Take 5 mg by mouth at bedtime.     metoprolol 50 MG tablet  Commonly known as:  LOPRESSOR  Take 50 mg by mouth 2 (two) times daily. Hold for blood pressure <110 and heart rate <60     midodrine 10 MG tablet    Commonly known as:  PROAMATINE  Take 10 mg by mouth every Monday, Wednesday, and Friday. On dialysis days     NEPHRO-VITE PO  Take by mouth. 1 by mouth every night at bedtime     NUTRITIONAL DRINK MIX PO  Take 1 each by mouth daily with supper. Mighty shake     ondansetron 4 MG disintegrating tablet  Commonly known as:  ZOFRAN-ODT  Take 4 mg by mouth every 6 (six) hours. PER MED LIST FROM [facility], PT IS TO TAKE EVERY 6 HOURS AROUND THE CLOCK     oxyCODONE 5 MG immediate release tablet  Commonly known as:  Oxy IR/ROXICODONE  Take 1 tablet (5 mg total) by mouth every 4 (four) hours as needed for severe pain.     pantoprazole 40 MG tablet  Commonly known as:  PROTONIX  Take 1 tablet (40 mg total) by mouth daily.         Allergies  Allergen Reactions  . Erythromycin Swelling  . Penicillins Other (See Comments)    Unknown allergic reaction.  She has taken keflex many times without problems  . Codeine Rash   REVIEW OF SYSTEMS:  GENERAL: no fatigue, no weight changes, no fever, chills  RESPIRATORY: no cough, SOB, DOE, wheezing, hemoptysis CARDIAC: no chest pain GI: no abdominal pain, diarrhea, constipation, heart burn, nausea or vomiting  PHYSICAL EXAMINATION  GENERAL: no acute distress, obese NECK: supple, trachea midline, no neck masses, no thyroid tenderness, no thyromegaly LYMPHATICS: no LAN in the neck, no supraclavicular LAN RESPIRATORY: breathing is even & unlabored, BS CTAB CARDIAC: RRR, no murmur,no extra heart sounds, BLE edema 1+ GI: abdomen soft, normal BS, no masses, no tenderness, no hepatomegaly, no splenomegaly EXTREMITIES: Able to move all 4 extremities; Left wrist AV fistula + for bruit and thrill PSYCHIATRIC: the patient is alert & oriented to person, affect & behavior appropriate  LABS/RADIOLOGY: Labs reviewed: 11/26/15  Pancreatic elastase-1  >500 09/21/15  WBC 9.4 hemoglobin 9.1 hematocrit 28.7 MCV 95.7 platelet 102 09/19/15  WBC 5.2 hemoglobin 10.7  hematocrit 28.3  MCV 94.6 platelet 229 09/17/15  Fecal fat qualitative, normal 08/14/15  WBC 8.4 hemoglobin 11.7 hematocrit 35.6 mCV 93.0  Platelet 175 sodium 143 potassium 4.7   Glucose 87  BUN 12  Creatinine 2.37  Calcium 8.0  07/23/15  Hemoglobin A1c 5.1 Basic Metabolic Panel:  Recent Labs  12/19/14 0501 12/19/14 0750 12/20/14 0413 12/22/14 0430 02/12/15 0636 03/20/15 1112 04/12/15 0616 04/23/15  NA  --  140 138 136 136 141 141 142  K  --  3.6 4.5 3.9 3.5 3.7 3.0* 3.5  CL  --  101 101 101 98  --   --   --   CO2  --  24 23 22   --   --   --   --   GLUCOSE  --  156* 188* 211* 179* 149* 158*  --   BUN  --  31* 43* 45* 73*  --   --  26*  CREATININE  --  4.42* 5.49* 5.06* 5.60*  --   --  4.7*  CALCIUM  --  8.4 8.1* 8.6  --   --   --   --   MG 2.1  --   --   --   --   --   --   --    Liver Function Tests:  Recent Labs  04/23/15  AST 28  ALT 17  ALKPHOS 84    CBC:  Recent Labs  12/18/14 1918  12/20/14 1311 12/21/14 0412 12/22/14 0430 02/12/15 0636 03/20/15 1112 04/12/15 0616  WBC 9.5  < > 8.8 9.7 9.1  --   --   --   NEUTROABS 4.8  --   --   --   --   --   --   --   HGB 11.2*  < > 10.3* 10.6* 10.4* 12.6 10.9* 11.9*  HCT 37.6  < > 35.1* 36.1 35.3* 37.0 32.0* 35.0*  MCV 98.2  < > 97.2 99.4 97.0  --   --   --   PLT 159  < > 182 177 197  --   --   --   < > = values in this interval not displayed.  Cardiac Enzymes:  Recent Labs  12/19/14 0501 12/19/14 0950 12/19/14 1605  TROPONINI 0.09* 0.09* 0.08*    CBG:  Recent Labs  02/12/15 0853 03/20/15 1519 04/12/15 0951  GLUCAP 155* 124* 135*     ASSESSMENT/PLAN:  Atrial fibrillation - rate controlled; continue amiodarone 200 mg by mouth  daily and Lopressor 50 mg by mouth twice a day; aspirin 325 mg by mouth daily, not a candidate for Coumadin   ESRD - on hemodialysis Mondays, Wednesdays and Fridays; continue fluid restriction 1800 mL/24 hour  Hyperlipidemia - continue Lipitor 80 mg 1 tab by mouth every 6  p.m.  Anemia of chronic disease - hemoglobin 9.1; stable; continue Aranesp 150 g/0.3 mL IV every Wednesdays during hemodialysis  Diabetes mellitus with renal complication - hemoglobin A1c 5.1; Levemir was discontinued and continue Humalog sliding scale 3 times a day with meals  Insomnia - continue melatonin 5 mg by mouth daily at bedtime  Renovascular hypertension - well controlled; continue Lopressor 50 mg by mouth twice a day   Hemodialysis associated hypotension - continue midodrine 10 mg by mouth every Mondays, Wednesdays and Fridays (dialysis days)  Anxiety - mood is stable; continue Valium 2.5 mg by mouth daily at bedtime  Gastritis - recently decreased  Protonix to 40 mg by mouth PO Q D  IBS - recently increased Bentyl 20 mg 1 tab TID and Imodium 2 mg 1 tab PO TID  Vertigo - continue Antivert 12.5 mg PO Q 12 H PRN    Goals of care:  Long-term care  [facility], NP [employer] [phone]

## 2015-12-19 DIAGNOSIS — N186 End stage renal disease: Secondary | ICD-10-CM | POA: Diagnosis not present

## 2015-12-19 DIAGNOSIS — D631 Anemia in chronic kidney disease: Secondary | ICD-10-CM | POA: Diagnosis not present

## 2015-12-19 DIAGNOSIS — E1122 Type 2 diabetes mellitus with diabetic chronic kidney disease: Secondary | ICD-10-CM | POA: Diagnosis not present

## 2015-12-19 DIAGNOSIS — D509 Iron deficiency anemia, unspecified: Secondary | ICD-10-CM | POA: Diagnosis not present

## 2015-12-21 DIAGNOSIS — D631 Anemia in chronic kidney disease: Secondary | ICD-10-CM | POA: Diagnosis not present

## 2015-12-21 DIAGNOSIS — E1122 Type 2 diabetes mellitus with diabetic chronic kidney disease: Secondary | ICD-10-CM | POA: Diagnosis not present

## 2015-12-21 DIAGNOSIS — N186 End stage renal disease: Secondary | ICD-10-CM | POA: Diagnosis not present

## 2015-12-21 DIAGNOSIS — D509 Iron deficiency anemia, unspecified: Secondary | ICD-10-CM | POA: Diagnosis not present

## 2015-12-22 DIAGNOSIS — N186 End stage renal disease: Secondary | ICD-10-CM | POA: Diagnosis not present

## 2015-12-22 DIAGNOSIS — E1122 Type 2 diabetes mellitus with diabetic chronic kidney disease: Secondary | ICD-10-CM | POA: Diagnosis not present

## 2015-12-22 DIAGNOSIS — Z992 Dependence on renal dialysis: Secondary | ICD-10-CM | POA: Diagnosis not present

## 2015-12-24 DIAGNOSIS — D631 Anemia in chronic kidney disease: Secondary | ICD-10-CM | POA: Diagnosis not present

## 2015-12-24 DIAGNOSIS — D509 Iron deficiency anemia, unspecified: Secondary | ICD-10-CM | POA: Diagnosis not present

## 2015-12-24 DIAGNOSIS — N186 End stage renal disease: Secondary | ICD-10-CM | POA: Diagnosis not present

## 2015-12-24 DIAGNOSIS — E1122 Type 2 diabetes mellitus with diabetic chronic kidney disease: Secondary | ICD-10-CM | POA: Diagnosis not present

## 2015-12-26 DIAGNOSIS — N186 End stage renal disease: Secondary | ICD-10-CM | POA: Diagnosis not present

## 2015-12-26 DIAGNOSIS — D509 Iron deficiency anemia, unspecified: Secondary | ICD-10-CM | POA: Diagnosis not present

## 2015-12-26 DIAGNOSIS — E1122 Type 2 diabetes mellitus with diabetic chronic kidney disease: Secondary | ICD-10-CM | POA: Diagnosis not present

## 2015-12-26 DIAGNOSIS — D631 Anemia in chronic kidney disease: Secondary | ICD-10-CM | POA: Diagnosis not present

## 2015-12-28 DIAGNOSIS — D631 Anemia in chronic kidney disease: Secondary | ICD-10-CM | POA: Diagnosis not present

## 2015-12-28 DIAGNOSIS — E1122 Type 2 diabetes mellitus with diabetic chronic kidney disease: Secondary | ICD-10-CM | POA: Diagnosis not present

## 2015-12-28 DIAGNOSIS — N186 End stage renal disease: Secondary | ICD-10-CM | POA: Diagnosis not present

## 2015-12-28 DIAGNOSIS — D509 Iron deficiency anemia, unspecified: Secondary | ICD-10-CM | POA: Diagnosis not present

## 2015-12-31 DIAGNOSIS — D631 Anemia in chronic kidney disease: Secondary | ICD-10-CM | POA: Diagnosis not present

## 2015-12-31 DIAGNOSIS — N186 End stage renal disease: Secondary | ICD-10-CM | POA: Diagnosis not present

## 2015-12-31 DIAGNOSIS — D509 Iron deficiency anemia, unspecified: Secondary | ICD-10-CM | POA: Diagnosis not present

## 2015-12-31 DIAGNOSIS — M1000 Idiopathic gout, unspecified site: Secondary | ICD-10-CM | POA: Diagnosis not present

## 2015-12-31 DIAGNOSIS — E1122 Type 2 diabetes mellitus with diabetic chronic kidney disease: Secondary | ICD-10-CM | POA: Diagnosis not present

## 2016-01-02 DIAGNOSIS — E1122 Type 2 diabetes mellitus with diabetic chronic kidney disease: Secondary | ICD-10-CM | POA: Diagnosis not present

## 2016-01-02 DIAGNOSIS — D631 Anemia in chronic kidney disease: Secondary | ICD-10-CM | POA: Diagnosis not present

## 2016-01-02 DIAGNOSIS — D509 Iron deficiency anemia, unspecified: Secondary | ICD-10-CM | POA: Diagnosis not present

## 2016-01-02 DIAGNOSIS — N186 End stage renal disease: Secondary | ICD-10-CM | POA: Diagnosis not present

## 2016-01-04 DIAGNOSIS — D631 Anemia in chronic kidney disease: Secondary | ICD-10-CM | POA: Diagnosis not present

## 2016-01-04 DIAGNOSIS — D509 Iron deficiency anemia, unspecified: Secondary | ICD-10-CM | POA: Diagnosis not present

## 2016-01-04 DIAGNOSIS — N186 End stage renal disease: Secondary | ICD-10-CM | POA: Diagnosis not present

## 2016-01-04 DIAGNOSIS — E1122 Type 2 diabetes mellitus with diabetic chronic kidney disease: Secondary | ICD-10-CM | POA: Diagnosis not present

## 2016-01-07 DIAGNOSIS — D509 Iron deficiency anemia, unspecified: Secondary | ICD-10-CM | POA: Diagnosis not present

## 2016-01-07 DIAGNOSIS — E1122 Type 2 diabetes mellitus with diabetic chronic kidney disease: Secondary | ICD-10-CM | POA: Diagnosis not present

## 2016-01-07 DIAGNOSIS — N186 End stage renal disease: Secondary | ICD-10-CM | POA: Diagnosis not present

## 2016-01-07 DIAGNOSIS — D631 Anemia in chronic kidney disease: Secondary | ICD-10-CM | POA: Diagnosis not present

## 2016-01-09 DIAGNOSIS — D631 Anemia in chronic kidney disease: Secondary | ICD-10-CM | POA: Diagnosis not present

## 2016-01-09 DIAGNOSIS — E1122 Type 2 diabetes mellitus with diabetic chronic kidney disease: Secondary | ICD-10-CM | POA: Diagnosis not present

## 2016-01-09 DIAGNOSIS — N186 End stage renal disease: Secondary | ICD-10-CM | POA: Diagnosis not present

## 2016-01-09 DIAGNOSIS — D509 Iron deficiency anemia, unspecified: Secondary | ICD-10-CM | POA: Diagnosis not present

## 2016-01-11 DIAGNOSIS — D509 Iron deficiency anemia, unspecified: Secondary | ICD-10-CM | POA: Diagnosis not present

## 2016-01-11 DIAGNOSIS — E1122 Type 2 diabetes mellitus with diabetic chronic kidney disease: Secondary | ICD-10-CM | POA: Diagnosis not present

## 2016-01-11 DIAGNOSIS — N186 End stage renal disease: Secondary | ICD-10-CM | POA: Diagnosis not present

## 2016-01-11 DIAGNOSIS — D631 Anemia in chronic kidney disease: Secondary | ICD-10-CM | POA: Diagnosis not present

## 2016-01-14 DIAGNOSIS — D509 Iron deficiency anemia, unspecified: Secondary | ICD-10-CM | POA: Diagnosis not present

## 2016-01-14 DIAGNOSIS — D631 Anemia in chronic kidney disease: Secondary | ICD-10-CM | POA: Diagnosis not present

## 2016-01-14 DIAGNOSIS — N186 End stage renal disease: Secondary | ICD-10-CM | POA: Diagnosis not present

## 2016-01-14 DIAGNOSIS — E1122 Type 2 diabetes mellitus with diabetic chronic kidney disease: Secondary | ICD-10-CM | POA: Diagnosis not present

## 2016-01-15 ENCOUNTER — Ambulatory Visit (INDEPENDENT_AMBULATORY_CARE_PROVIDER_SITE_OTHER): Payer: Medicare Other | Admitting: Internal Medicine

## 2016-01-15 ENCOUNTER — Encounter: Payer: Self-pay | Admitting: Internal Medicine

## 2016-01-15 VITALS — BP 86/46 | HR 60

## 2016-01-15 DIAGNOSIS — R634 Abnormal weight loss: Secondary | ICD-10-CM

## 2016-01-15 DIAGNOSIS — R197 Diarrhea, unspecified: Secondary | ICD-10-CM | POA: Diagnosis not present

## 2016-01-15 DIAGNOSIS — N186 End stage renal disease: Secondary | ICD-10-CM

## 2016-01-15 DIAGNOSIS — Z992 Dependence on renal dialysis: Secondary | ICD-10-CM

## 2016-01-15 DIAGNOSIS — R630 Anorexia: Secondary | ICD-10-CM

## 2016-01-15 DIAGNOSIS — D649 Anemia, unspecified: Secondary | ICD-10-CM

## 2016-01-15 MED ORDER — PANTOPRAZOLE SODIUM 40 MG PO TBEC: 40.0000 mg | DELAYED_RELEASE_TABLET | Freq: Two times a day (BID) | ORAL | Status: DC

## 2016-01-15 NOTE — Progress Notes (Signed)
Subjective:    Patient ID: [name] James, female    DOB: February 16, 1936, 80 y.o.   MRN: [id number]  HPI [name] James is a 80 year old female with a history of Barrett's esophagus, GERD, gastritis, heme positive normocytic anemia, COPD on oxygen, diabetes, ESRD on HD and remote breast cancer who is here for follow-up. She is here today with her daughter. She was last seen on 11/13/2015.  She has continued to lose additional weight but she reports that "I am hanging in there". She continues on dialysis Monday Wednesday and Friday. She's having issues with loose stools particularly after eating. Fecal elastase was returned and normal. She is using Imodium 1 or 2 times daily. Usually stools are formed but soft in the morning and then after eating she'll have a urgent loose stools which can "pour out". Appetite has been very poor but she denies abdominal pain with eating. She doesn't particularly like the food at her living facility. Her daughter would like to start Ensure Plus which she feels will taste better. She is using pantoprazole 40 mg once daily. She is also using dicyclomine 20 mg 3 times daily.  Her daughter remains very much interested in her mother pursuing colonoscopy given her diarrhea and history of heme positive stool. The patient is less willing and wants to discuss this further with her nephrologist. Her daughter inquires about and would like to proceed with Cologuard.   Review of Systems As per history of present illness, otherwise negative  Current Medications, Allergies, Past Medical History, Past Surgical History, Family History and Social History were reviewed in [employer] record.      Objective:   Physical Exam BP 86/46 mmHg  Pulse 60  Ht   Wt  Constitutional: Chronically ill-appearing, pale, female in no acute distress sitting in wheelchair  HEENT: Normocephalic and atraumatic. Oropharynx is clear and moist. No oropharyngeal exudate.  Conjunctivae are pale.  No scleral icterus. Oxygen by nasal cannula in place Cardiovascular: Normal rate, regular rhythm and intact distal pulses.  Pulmonary/chest: Effort normal and breath sounds normal. No wheezing, rales or rhonchi. Abdominal: Soft, nontender, nondistended. Bowel sounds active throughout.  Extremities: no clubbing, cyanosis, rtrace pretib edema Neurological: Alert and oriented to person place and time. Skin: Skin is warm and dry. No rashes noted. Psychiatric: Normal mood and affect. Behavior is normal.  CT scan abdomen and pelvis without contrast January 2016 -- no evidence of bowel obstruction, moderate colonic stool burden. 2 nonobstructing kidney stones on the left, no hydronephrosis. Bilateral renal cysts of varying sizes in complexities likely benign. No CT findings to account for abdominal pain nausea or inability to eat.  EGD 10/24/2014 -- short segment Barrett's esophagus without dysplasia, mild reflux esophagitis. Mild gastritis in the gastric antrum. Fundic gland appearing polyps in the proximal stomach. Moderate duodenitis. Biopsies from the stomach showed chronic inactive gastritis with reactive epithelial changes. No Helicobacter, dysplasia or cancer. Duodenal biopsies consistent with peptic duodenitis.    Assessment & Plan:   80 year old female with a history of Barrett's esophagus, GERD, gastritis, heme positive normocytic anemia, COPD on oxygen, diabetes, ESRD on HD and remote breast cancer who is here for follow-up.  1. Intermittent diarrhea/poor appetite/weight loss -- the patient's daughter continues to be very concerned about her poor appetite and seemingly progressive debility. She has continued to lose weight which I think is likely related to her multiple serious and chronic medical comorbidities, specifically end-stage renal disease and COPD. --I have not been able  to ascertain reason GI reason for poor appetite and intermittent urgent loose stools.  Infectious workup is been unrevealing as has stool studies. Bentyl seems to help some with crampy abdominal pain so we'll continue 20 mg 3 times daily. I do think she should continue schedule Imodium each morning and add additional doses as loose stools occur. Avoid reflex use of laxatives by nursing facility if patient fails to have a bowel movement  --Her daughter is concerned about colon cancer given heme positive stool and diarrhea along with weight loss. Patient to debilitated for colonoscopy as recently as October, as colonoscopy had been scheduled in the outpatient hospital setting and was canceled by patient.  She still remains uninterested in colonoscopy at present and most discuss this with Dr. Mercy James. As regards to Cologuard, if we are not going to pursue colonoscopy based on positive result I'm not sure this test has any value for her. --Repeat CT scan abdomen and pelvis with IV contrast to further evaluate weight loss, poor appetite, and diarrhea  2. Anemia -- normocytic on hemodialysis. Being followed closely by nephrology.  3-4 month follow-up, sooner if necessary

## 2016-01-15 NOTE — Patient Instructions (Addendum)
We have sent the following medications to your pharmacy for you to pick up at your convenience: Pantoprazole 40 mg twice daily (increase from once daily dosig)  You may take Imodium 2-3 times daily as needed for diarrhea.  Please follow up with Dr Hilarie Fredrickson in 3-4 months.  You have been scheduled for a CT scan of the abdomen and pelvis at [facility] ([address]---this is in the same building as [facility]).   You are scheduled on Tuesday, 01/22/16 at 1:00 pm. You should arrive 15 minutes prior to your appointment time for registration. Please follow the written instructions below on the day of your exam:  WARNING: IF YOU ARE ALLERGIC TO IODINE/X-RAY DYE, PLEASE NOTIFY RADIOLOGY IMMEDIATELY AT [phone]! YOU WILL BE GIVEN A 13 HOUR PREMEDICATION PREP.  1) Do not eat or drink anything after 9:00 am (4 hours prior to your test) 2) You have been given 2 bottles of oral contrast to drink. The solution may taste better if refrigerated, but do NOT add ice or any other liquid to this solution. Shake well before drinking.    Drink 1 bottle of contrast @ 11:00 am (2 hours prior to your exam)  Drink 1 bottle of contrast @ 12:00 pm (1 hour prior to your exam)  You may take any medications as prescribed with a small amount of water except for the following: Metformin, Glucophage, Glucovance, Avandamet, Riomet, Fortamet, Actoplus Met, Janumet, Glumetza or Metaglip. The above medications must be held the day of the exam AND 48 hours after the exam.  The purpose of you drinking the oral contrast is to aid in the visualization of your intestinal tract. The contrast solution may cause some diarrhea. Before your exam is started, you will be given a small amount of fluid to drink. Depending on your individual set of symptoms, you may also receive an intravenous injection of x-ray contrast/dye. Plan on being at [facility] for 30 minutes or longer, depending on the type of exam you  are having performed.  This test typically takes 30-45 minutes to complete.  If you have any questions regarding your exam or if you need to reschedule, you may call the CT department at [phone] between the hours of 8:00 am and 5:00 pm, Monday-Friday.  ________________________________________________________________________

## 2016-01-16 DIAGNOSIS — E1122 Type 2 diabetes mellitus with diabetic chronic kidney disease: Secondary | ICD-10-CM | POA: Diagnosis not present

## 2016-01-16 DIAGNOSIS — D509 Iron deficiency anemia, unspecified: Secondary | ICD-10-CM | POA: Diagnosis not present

## 2016-01-16 DIAGNOSIS — N186 End stage renal disease: Secondary | ICD-10-CM | POA: Diagnosis not present

## 2016-01-16 DIAGNOSIS — D631 Anemia in chronic kidney disease: Secondary | ICD-10-CM | POA: Diagnosis not present

## 2016-01-18 DIAGNOSIS — N186 End stage renal disease: Secondary | ICD-10-CM | POA: Diagnosis not present

## 2016-01-18 DIAGNOSIS — D631 Anemia in chronic kidney disease: Secondary | ICD-10-CM | POA: Diagnosis not present

## 2016-01-18 DIAGNOSIS — E1122 Type 2 diabetes mellitus with diabetic chronic kidney disease: Secondary | ICD-10-CM | POA: Diagnosis not present

## 2016-01-18 DIAGNOSIS — D509 Iron deficiency anemia, unspecified: Secondary | ICD-10-CM | POA: Diagnosis not present

## 2016-01-21 ENCOUNTER — Encounter: Payer: Self-pay | Admitting: Internal Medicine

## 2016-01-21 DIAGNOSIS — D509 Iron deficiency anemia, unspecified: Secondary | ICD-10-CM | POA: Diagnosis not present

## 2016-01-21 DIAGNOSIS — D631 Anemia in chronic kidney disease: Secondary | ICD-10-CM | POA: Diagnosis not present

## 2016-01-21 DIAGNOSIS — N186 End stage renal disease: Secondary | ICD-10-CM | POA: Diagnosis not present

## 2016-01-21 DIAGNOSIS — E1122 Type 2 diabetes mellitus with diabetic chronic kidney disease: Secondary | ICD-10-CM | POA: Diagnosis not present

## 2016-01-21 NOTE — Progress Notes (Signed)
Patient ID: [name] James, female   DOB: Oct 26, 1936, 80 y.o.   MRN: [id number]

## 2016-01-22 ENCOUNTER — Ambulatory Visit (INDEPENDENT_AMBULATORY_CARE_PROVIDER_SITE_OTHER)
Admission: RE | Admit: 2016-01-22 | Discharge: 2016-01-22 | Disposition: A | Discharge status: Home / Self Care | Payer: Medicare Other | Source: Ambulatory Visit | Attending: Internal Medicine | Admitting: Internal Medicine

## 2016-01-22 DIAGNOSIS — R634 Abnormal weight loss: Secondary | ICD-10-CM | POA: Diagnosis not present

## 2016-01-22 DIAGNOSIS — E1122 Type 2 diabetes mellitus with diabetic chronic kidney disease: Secondary | ICD-10-CM | POA: Diagnosis not present

## 2016-01-22 DIAGNOSIS — R197 Diarrhea, unspecified: Secondary | ICD-10-CM | POA: Diagnosis not present

## 2016-01-22 DIAGNOSIS — Z992 Dependence on renal dialysis: Secondary | ICD-10-CM | POA: Diagnosis not present

## 2016-01-22 DIAGNOSIS — R630 Anorexia: Secondary | ICD-10-CM

## 2016-01-22 DIAGNOSIS — N186 End stage renal disease: Secondary | ICD-10-CM | POA: Diagnosis not present

## 2016-01-22 MED ORDER — IOHEXOL 300 MG/ML  SOLN
100.0000 mL | Freq: Once | INTRAMUSCULAR | Status: AC | PRN
  Administered 2016-01-22: 75 mL via INTRAVENOUS

## 2016-01-23 DIAGNOSIS — Z23 Encounter for immunization: Secondary | ICD-10-CM | POA: Diagnosis not present

## 2016-01-23 DIAGNOSIS — E1122 Type 2 diabetes mellitus with diabetic chronic kidney disease: Secondary | ICD-10-CM | POA: Diagnosis not present

## 2016-01-23 DIAGNOSIS — N186 End stage renal disease: Secondary | ICD-10-CM | POA: Diagnosis not present

## 2016-01-23 DIAGNOSIS — D631 Anemia in chronic kidney disease: Secondary | ICD-10-CM | POA: Diagnosis not present

## 2016-01-23 NOTE — Progress Notes (Signed)
This encounter was created in error - please disregard.

## 2016-01-25 DIAGNOSIS — N186 End stage renal disease: Secondary | ICD-10-CM | POA: Diagnosis not present

## 2016-01-25 DIAGNOSIS — Z23 Encounter for immunization: Secondary | ICD-10-CM | POA: Diagnosis not present

## 2016-01-25 DIAGNOSIS — E1122 Type 2 diabetes mellitus with diabetic chronic kidney disease: Secondary | ICD-10-CM | POA: Diagnosis not present

## 2016-01-25 DIAGNOSIS — D631 Anemia in chronic kidney disease: Secondary | ICD-10-CM | POA: Diagnosis not present

## 2016-01-28 DIAGNOSIS — D631 Anemia in chronic kidney disease: Secondary | ICD-10-CM | POA: Diagnosis not present

## 2016-01-28 DIAGNOSIS — Z23 Encounter for immunization: Secondary | ICD-10-CM | POA: Diagnosis not present

## 2016-01-28 DIAGNOSIS — N186 End stage renal disease: Secondary | ICD-10-CM | POA: Diagnosis not present

## 2016-01-28 DIAGNOSIS — E1122 Type 2 diabetes mellitus with diabetic chronic kidney disease: Secondary | ICD-10-CM | POA: Diagnosis not present

## 2016-01-29 DIAGNOSIS — N186 End stage renal disease: Secondary | ICD-10-CM | POA: Diagnosis not present

## 2016-01-29 DIAGNOSIS — T82858D Stenosis of vascular prosthetic devices, implants and grafts, subsequent encounter: Secondary | ICD-10-CM | POA: Diagnosis not present

## 2016-01-29 DIAGNOSIS — Z992 Dependence on renal dialysis: Secondary | ICD-10-CM | POA: Diagnosis not present

## 2016-01-29 DIAGNOSIS — I871 Compression of vein: Secondary | ICD-10-CM | POA: Diagnosis not present

## 2016-01-30 DIAGNOSIS — N186 End stage renal disease: Secondary | ICD-10-CM | POA: Diagnosis not present

## 2016-01-30 DIAGNOSIS — E1122 Type 2 diabetes mellitus with diabetic chronic kidney disease: Secondary | ICD-10-CM | POA: Diagnosis not present

## 2016-01-30 DIAGNOSIS — Z23 Encounter for immunization: Secondary | ICD-10-CM | POA: Diagnosis not present

## 2016-01-30 DIAGNOSIS — D631 Anemia in chronic kidney disease: Secondary | ICD-10-CM | POA: Diagnosis not present

## 2016-02-01 DIAGNOSIS — N186 End stage renal disease: Secondary | ICD-10-CM | POA: Diagnosis not present

## 2016-02-01 DIAGNOSIS — Z23 Encounter for immunization: Secondary | ICD-10-CM | POA: Diagnosis not present

## 2016-02-01 DIAGNOSIS — D631 Anemia in chronic kidney disease: Secondary | ICD-10-CM | POA: Diagnosis not present

## 2016-02-01 DIAGNOSIS — E1122 Type 2 diabetes mellitus with diabetic chronic kidney disease: Secondary | ICD-10-CM | POA: Diagnosis not present

## 2016-02-04 DIAGNOSIS — E1122 Type 2 diabetes mellitus with diabetic chronic kidney disease: Secondary | ICD-10-CM | POA: Diagnosis not present

## 2016-02-04 DIAGNOSIS — Z23 Encounter for immunization: Secondary | ICD-10-CM | POA: Diagnosis not present

## 2016-02-04 DIAGNOSIS — D631 Anemia in chronic kidney disease: Secondary | ICD-10-CM | POA: Diagnosis not present

## 2016-02-04 DIAGNOSIS — N186 End stage renal disease: Secondary | ICD-10-CM | POA: Diagnosis not present

## 2016-02-06 DIAGNOSIS — N186 End stage renal disease: Secondary | ICD-10-CM | POA: Diagnosis not present

## 2016-02-06 DIAGNOSIS — D631 Anemia in chronic kidney disease: Secondary | ICD-10-CM | POA: Diagnosis not present

## 2016-02-06 DIAGNOSIS — E1122 Type 2 diabetes mellitus with diabetic chronic kidney disease: Secondary | ICD-10-CM | POA: Diagnosis not present

## 2016-02-06 DIAGNOSIS — Z23 Encounter for immunization: Secondary | ICD-10-CM | POA: Diagnosis not present

## 2016-02-08 DIAGNOSIS — D631 Anemia in chronic kidney disease: Secondary | ICD-10-CM | POA: Diagnosis not present

## 2016-02-08 DIAGNOSIS — E1122 Type 2 diabetes mellitus with diabetic chronic kidney disease: Secondary | ICD-10-CM | POA: Diagnosis not present

## 2016-02-08 DIAGNOSIS — N186 End stage renal disease: Secondary | ICD-10-CM | POA: Diagnosis not present

## 2016-02-08 DIAGNOSIS — Z23 Encounter for immunization: Secondary | ICD-10-CM | POA: Diagnosis not present

## 2016-02-11 DIAGNOSIS — D631 Anemia in chronic kidney disease: Secondary | ICD-10-CM | POA: Diagnosis not present

## 2016-02-11 DIAGNOSIS — Z23 Encounter for immunization: Secondary | ICD-10-CM | POA: Diagnosis not present

## 2016-02-11 DIAGNOSIS — E1122 Type 2 diabetes mellitus with diabetic chronic kidney disease: Secondary | ICD-10-CM | POA: Diagnosis not present

## 2016-02-11 DIAGNOSIS — N186 End stage renal disease: Secondary | ICD-10-CM | POA: Diagnosis not present

## 2016-02-13 ENCOUNTER — Non-Acute Institutional Stay (SKILLED_NURSING_FACILITY): Payer: Medicare Other | Admitting: Adult Health

## 2016-02-13 ENCOUNTER — Encounter: Payer: Self-pay | Admitting: Adult Health

## 2016-02-13 DIAGNOSIS — E1122 Type 2 diabetes mellitus with diabetic chronic kidney disease: Secondary | ICD-10-CM | POA: Diagnosis not present

## 2016-02-13 DIAGNOSIS — I480 Paroxysmal atrial fibrillation: Secondary | ICD-10-CM

## 2016-02-13 DIAGNOSIS — D631 Anemia in chronic kidney disease: Secondary | ICD-10-CM | POA: Diagnosis not present

## 2016-02-13 DIAGNOSIS — K589 Irritable bowel syndrome without diarrhea: Secondary | ICD-10-CM

## 2016-02-13 DIAGNOSIS — F419 Anxiety disorder, unspecified: Secondary | ICD-10-CM

## 2016-02-13 DIAGNOSIS — G4700 Insomnia, unspecified: Secondary | ICD-10-CM | POA: Diagnosis not present

## 2016-02-13 DIAGNOSIS — Z794 Long term (current) use of insulin: Secondary | ICD-10-CM

## 2016-02-13 DIAGNOSIS — Z992 Dependence on renal dialysis: Secondary | ICD-10-CM

## 2016-02-13 DIAGNOSIS — E785 Hyperlipidemia, unspecified: Secondary | ICD-10-CM

## 2016-02-13 DIAGNOSIS — D638 Anemia in other chronic diseases classified elsewhere: Secondary | ICD-10-CM

## 2016-02-13 DIAGNOSIS — E1140 Type 2 diabetes mellitus with diabetic neuropathy, unspecified: Secondary | ICD-10-CM | POA: Diagnosis not present

## 2016-02-13 DIAGNOSIS — N186 End stage renal disease: Secondary | ICD-10-CM | POA: Diagnosis not present

## 2016-02-13 DIAGNOSIS — K2970 Gastritis, unspecified, without bleeding: Secondary | ICD-10-CM | POA: Diagnosis not present

## 2016-02-13 DIAGNOSIS — I150 Renovascular hypertension: Secondary | ICD-10-CM

## 2016-02-13 DIAGNOSIS — I10 Essential (primary) hypertension: Secondary | ICD-10-CM | POA: Diagnosis not present

## 2016-02-13 DIAGNOSIS — K2990 Gastroduodenitis, unspecified, without bleeding: Secondary | ICD-10-CM

## 2016-02-13 DIAGNOSIS — R42 Dizziness and giddiness: Secondary | ICD-10-CM

## 2016-02-13 DIAGNOSIS — Z23 Encounter for immunization: Secondary | ICD-10-CM | POA: Diagnosis not present

## 2016-02-13 NOTE — Progress Notes (Signed)
Patient ID: [name] James, female   DOB: [birth year], 80 y.o.   MRN: [id number]     DATE:     02/13/16   Facility:  Nursing Home Location:  [place] Room Number: 205-2 LEVEL OF CARE:  SNF (31)   Chief Complaint  Patient presents with  . Medical Management of Chronic Issues    HISTORY OF PRESENT ILLNESS:  This is a 80-year-ol and female who is being seen for a routine visit. She is a long-term resident  @ [facility]. Her Protonix was recently increased to BID for her gastritis. She reports not having any abdominal pain @ this time. She was recently started on allopurinol for gout and latest uric acid is  4.1, normal. Valium was recently increased to 5 mg Q hs so she will be less anxious and be able to sleep soundly.      PAST MEDICAL HISTORY:  Past Medical History  Diagnosis Date  . CHF (congestive heart failure) ([place]) 05/13/2011    echo - [id number]; stage 1 diastolic dysfunction; elevated LV filling pressure, MAC  . Chest pain, atypical 12/05/2008    R/Lmv- normal perfusion all regions, noe ECG changes   . Claudication ([place]) 10/30/2005    doppler - normal evaluation, no evidence of aneurysm, diameter reduction, dissection, compression or vascular abnormality)  . DM (diabetes mellitus) ([place])   . Peripheral neuropathy ([place])   . Hypertension   . Edema   . Gout   . Anxiety   . Cataract   . GERD (gastroesophageal reflux disease)   . Complication of anesthesia   . PONV (postoperative nausea and vomiting)   . Family history of adverse reaction to anesthesia     son also has nausea   . Dyslipidemia   . Anemia   . Morbid obesity ([place])   . Atrial fibrillation ([place])   . Barrett esophagus   . Renal insufficiency 10/30/2005    doppler - abn resistance consistent w/ parenchymal disease  . CKD (chronic kidney disease), stage III   . Renal cyst   . Arthritis     knees  . Breast cancer ([place]) 1980s    bil mastectomies, no radiation or chemo  .  Dysrhythmia     Atrial Fibrillation    CURRENT MEDICATIONS: Reviewed per MAR/see medication list   Medication List       This list is accurate as of: 02/13/16 11:59 PM.  Always use your most recent med list.               allopurinol 100 MG tablet  Commonly known as:  ZYLOPRIM  Take 100 mg by mouth daily.     amiodarone 200 MG tablet  Commonly known as:  PACERONE  Take 200 mg by mouth every morning.     aspirin 325 MG EC tablet  Take 325 mg by mouth daily.     atorvastatin 80 MG tablet  Commonly known as:  LIPITOR  Take 1 tablet (80 mg total) by mouth daily at 6 PM.     DECUBI-VITE Caps  Take 1 capsule by mouth daily for wound healing     diazepam 5 MG tablet  Commonly known as:  VALIUM  Take 5 mg by mouth at bedtime.     dicyclomine 20 MG tablet  Commonly known as:  BENTYL  Take 1 tablet (20 mg total) by mouth 3 (three) times daily.     dimethicone-zinc oxide cream  Apply topically  daily as needed for dry skin (Apply topically to buttock and sacrum).     dorzolamide-timolol 22.3-6.8 MG/ML ophthalmic solution  Commonly known as:  COSOPT  Place 1 drop into the left eye 2 (two) times daily. Wait 2-5 minutes when giving two eye meds     folic acid 1 MG tablet  Commonly known as:  FOLVITE  Take 1 mg by mouth daily.     insulin lispro 100 UNIT/ML injection  Commonly known as:  HUMALOG  Inject 0-12 Units into the skin 3 (three) times daily with meals. Per sliding scale:  150-200 = 2 units, 201-250 = 4 units, 251-300  = 6 units, 301-350 = 8 units, 351-400 = 10 units, >400 give 12 units and call MD.     loperamide 2 MG tablet  Commonly known as:  IMODIUM A-D  Take 2 mg by mouth 3 (three) times daily as needed for diarrhea or loose stools. May take one dose a day and refuse the other two if not needed.     loratadine 10 MG tablet  Commonly known as:  CLARITIN  Take 10 mg by mouth daily as needed for allergies.     meclizine 12.5 MG tablet  Commonly known as:   ANTIVERT  Take 12.5 mg by mouth every 12 (twelve) hours as needed for dizziness.     Melatonin 5 MG Tabs  Take 5 mg by mouth at bedtime.     metoprolol 50 MG tablet  Commonly known as:  LOPRESSOR  Take 50 mg by mouth 2 (two) times daily. Hold for blood pressure <110 and heart rate <60. Hold on dialysis days.     midodrine 10 MG tablet  Commonly known as:  PROAMATINE  Take 10 mg by mouth every Monday, Wednesday, and Friday. On dialysis days     NEPHRO-VITE PO  1 by mouth every night at bedtime     NUTRITIONAL DRINK MIX PO  Take 1 each by mouth 3 (three) times daily. Mighty shake TID with MedPass (10P, 2P, 8P)     ondansetron 4 MG disintegrating tablet  Commonly known as:  ZOFRAN-ODT  Take 4 mg by mouth every 6 (six) hours as needed for nausea or vomiting. PER MED LIST FROM [facility], PT IS TO TAKE EVERY 6 HOURS AROUND THE CLOCK     oxyCODONE 5 MG immediate release tablet  Commonly known as:  Oxy IR/ROXICODONE  Take 1 tablet (5 mg total) by mouth every 4 (four) hours as needed for severe pain.     OXYGEN  Inhale 2 L into the lungs continuous.     pantoprazole 40 MG tablet  Commonly known as:  PROTONIX  Take 40 mg by mouth 2 (two) times daily.     UNABLE TO FIND  Med Name: Preparation H CRM Aloe.  Apply to external hemorrhoids BID PRN.         Allergies  Allergen Reactions  . Erythromycin Swelling  . Penicillins Other (See Comments)    Unknown allergic reaction.  She has taken keflex many times without problems  . Codeine Rash   REVIEW OF SYSTEMS:  GENERAL: no fatigue, no weight changes, no fever, chills  RESPIRATORY: no cough, SOB, DOE, wheezing, hemoptysis CARDIAC: no chest pain GI: no abdominal pain, diarrhea, constipation, heart burn, nausea or vomiting  PHYSICAL EXAMINATION  GENERAL: no acute distress, obese SKIN:  Skin is warm and dry. NECK: supple, trachea midline, no neck masses, no thyroid tenderness, no thyromegaly LYMPHATICS: no LAN in  the neck, no  supraclavicular LAN RESPIRATORY: breathing is even & unlabored, BS CTAB CARDIAC: RRR, no murmur,no extra heart sounds, BLE edema 1+ GI: abdomen soft, normal BS, no masses, no tenderness, no hepatomegaly, no splenomegaly EXTREMITIES: Able to move all 4 extremities; Left wrist AV fistula + for bruit and thrill PSYCHIATRIC: the patient is alert & oriented to person, affect & behavior appropriate  LABS/RADIOLOGY: Labs reviewed: 12/31/15  Uric acid 4.1 11/26/15  Pancreatic elastase-1  >500 09/21/15  WBC 9.4 hemoglobin 9.1 hematocrit 28.7 MCV 95.7 platelet 102 09/19/15  WBC 5.2 hemoglobin 10.7 hematocrit 28.3  MCV 94.6 platelet 229 09/17/15  Fecal fat qualitative, normal 08/14/15  WBC 8.4 hemoglobin 11.7 hematocrit 35.6 mCV 93.0  Platelet 175 sodium 143 potassium 4.7   Glucose 87  BUN 12  Creatinine 2.37  Calcium 8.0 07/23/15  Hemoglobin A1c 5.1 Basic Metabolic Panel:  Recent Labs  03/20/15 1112 04/12/15 0616 04/23/15 12/06/15  NA 141 141 142 143  143  K 3.7 3.0* 3.5 3.9  3.9  GLUCOSE 149* 158*  --   --   BUN  --   --  26* 12  12  CREATININE  --   --  4.7* 2.1*  2.1*   Liver Function Tests:  Recent Labs  04/23/15 12/06/15  AST 28 33  33  ALT 17 25  25   ALKPHOS 84 103  103    CBC:  Recent Labs  03/20/15 1112 04/12/15 0616 12/06/15  WBC  --   --  4.1  4.1  NEUTROABS  --   --  1  1  HGB 10.9* 11.9* 9.5*  9.5*  HCT 32.0* 35.0* 32*  32*  PLT  --   --  125*  125*   CBG:  Recent Labs  03/20/15 1519 04/12/15 0951  GLUCAP 124* 135*     ASSESSMENT/PLAN:  IBS - continue Bentyl 20 mg 1 tab by mouth 3 times a day  Atrial fibrillation - rate controlled; continue amiodarone 200 mg by mouth  daily and Lopressor 50 mg by mouth twice a day; aspirin 325 mg by mouth daily, not a candidate for Coumadin   ESRD - on hemodialysis Mondays, Wednesdays and Fridays; continue fluid restriction 1800 mL/24 hour  Hyperlipidemia - continue Lipitor 80 mg 1 tab by mouth every 6  p.m.  Anemia of chronic disease - hgb 9.5 continue Aranesp 150 g/0.3 mL IV every Wednesdays during hemodialysis  Diabetes mellitus with renal complication - hemoglobin A1c 5.1well-controlled; continue Humalog sliding scale 3 times a day with meals  Insomnia - continue melatonin 5 mg by mouth daily at bedtime  Renovascular hypertension - well controlled; continue Lopressor 50 mg by mouth twice a day   Hemodialysis associated hypotension - continue midodrine 10 mg by mouth every Mondays, Wednesdays and Fridays (dialysis days)  Anxiety - mood is stable; continue Valium 5 mg by mouth daily at bedtime  Gastritis - recently increased  Protonix to 40 mg by mouth PO BID  IBS - continue Bentyl 20 mg 1 tab TID and Imodium 2 mg 1 tab PO TID  Vertigo - continue Antivert 12.5 mg PO Q 12 H PRN    Goals of care:  Long-term care     [facility], [facility] [phone]

## 2016-02-15 DIAGNOSIS — E1122 Type 2 diabetes mellitus with diabetic chronic kidney disease: Secondary | ICD-10-CM | POA: Diagnosis not present

## 2016-02-15 DIAGNOSIS — N186 End stage renal disease: Secondary | ICD-10-CM | POA: Diagnosis not present

## 2016-02-15 DIAGNOSIS — Z23 Encounter for immunization: Secondary | ICD-10-CM | POA: Diagnosis not present

## 2016-02-15 DIAGNOSIS — D631 Anemia in chronic kidney disease: Secondary | ICD-10-CM | POA: Diagnosis not present

## 2016-02-15 DIAGNOSIS — M6281 Muscle weakness (generalized): Secondary | ICD-10-CM | POA: Diagnosis not present

## 2016-02-15 DIAGNOSIS — E119 Type 2 diabetes mellitus without complications: Secondary | ICD-10-CM | POA: Diagnosis not present

## 2016-02-15 DIAGNOSIS — I10 Essential (primary) hypertension: Secondary | ICD-10-CM | POA: Diagnosis not present

## 2016-02-18 DIAGNOSIS — M6281 Muscle weakness (generalized): Secondary | ICD-10-CM | POA: Diagnosis not present

## 2016-02-18 DIAGNOSIS — I10 Essential (primary) hypertension: Secondary | ICD-10-CM | POA: Diagnosis not present

## 2016-02-18 DIAGNOSIS — D631 Anemia in chronic kidney disease: Secondary | ICD-10-CM | POA: Diagnosis not present

## 2016-02-18 DIAGNOSIS — Z23 Encounter for immunization: Secondary | ICD-10-CM | POA: Diagnosis not present

## 2016-02-18 DIAGNOSIS — N186 End stage renal disease: Secondary | ICD-10-CM | POA: Diagnosis not present

## 2016-02-18 DIAGNOSIS — E119 Type 2 diabetes mellitus without complications: Secondary | ICD-10-CM | POA: Diagnosis not present

## 2016-02-18 DIAGNOSIS — E1122 Type 2 diabetes mellitus with diabetic chronic kidney disease: Secondary | ICD-10-CM | POA: Diagnosis not present

## 2016-02-19 DIAGNOSIS — E119 Type 2 diabetes mellitus without complications: Secondary | ICD-10-CM | POA: Diagnosis not present

## 2016-02-19 DIAGNOSIS — H903 Sensorineural hearing loss, bilateral: Secondary | ICD-10-CM | POA: Diagnosis not present

## 2016-02-19 DIAGNOSIS — H9113 Presbycusis, bilateral: Secondary | ICD-10-CM | POA: Diagnosis not present

## 2016-02-19 DIAGNOSIS — N186 End stage renal disease: Secondary | ICD-10-CM | POA: Diagnosis not present

## 2016-02-19 DIAGNOSIS — M6281 Muscle weakness (generalized): Secondary | ICD-10-CM | POA: Diagnosis not present

## 2016-02-19 DIAGNOSIS — E1122 Type 2 diabetes mellitus with diabetic chronic kidney disease: Secondary | ICD-10-CM | POA: Diagnosis not present

## 2016-02-19 DIAGNOSIS — I10 Essential (primary) hypertension: Secondary | ICD-10-CM | POA: Diagnosis not present

## 2016-02-19 DIAGNOSIS — H6123 Impacted cerumen, bilateral: Secondary | ICD-10-CM | POA: Diagnosis not present

## 2016-02-19 DIAGNOSIS — Z992 Dependence on renal dialysis: Secondary | ICD-10-CM | POA: Diagnosis not present

## 2016-02-20 DIAGNOSIS — M6281 Muscle weakness (generalized): Secondary | ICD-10-CM | POA: Diagnosis not present

## 2016-02-20 DIAGNOSIS — N186 End stage renal disease: Secondary | ICD-10-CM | POA: Diagnosis not present

## 2016-02-20 DIAGNOSIS — D631 Anemia in chronic kidney disease: Secondary | ICD-10-CM | POA: Diagnosis not present

## 2016-02-20 DIAGNOSIS — E119 Type 2 diabetes mellitus without complications: Secondary | ICD-10-CM | POA: Diagnosis not present

## 2016-02-20 DIAGNOSIS — N2581 Secondary hyperparathyroidism of renal origin: Secondary | ICD-10-CM | POA: Diagnosis not present

## 2016-02-20 DIAGNOSIS — I10 Essential (primary) hypertension: Secondary | ICD-10-CM | POA: Diagnosis not present

## 2016-02-20 DIAGNOSIS — E1122 Type 2 diabetes mellitus with diabetic chronic kidney disease: Secondary | ICD-10-CM | POA: Diagnosis not present

## 2016-02-21 DIAGNOSIS — E119 Type 2 diabetes mellitus without complications: Secondary | ICD-10-CM | POA: Diagnosis not present

## 2016-02-21 DIAGNOSIS — I10 Essential (primary) hypertension: Secondary | ICD-10-CM | POA: Diagnosis not present

## 2016-02-21 DIAGNOSIS — M6281 Muscle weakness (generalized): Secondary | ICD-10-CM | POA: Diagnosis not present

## 2016-02-22 DIAGNOSIS — I10 Essential (primary) hypertension: Secondary | ICD-10-CM | POA: Diagnosis not present

## 2016-02-22 DIAGNOSIS — E119 Type 2 diabetes mellitus without complications: Secondary | ICD-10-CM | POA: Diagnosis not present

## 2016-02-22 DIAGNOSIS — N2581 Secondary hyperparathyroidism of renal origin: Secondary | ICD-10-CM | POA: Diagnosis not present

## 2016-02-22 DIAGNOSIS — D631 Anemia in chronic kidney disease: Secondary | ICD-10-CM | POA: Diagnosis not present

## 2016-02-22 DIAGNOSIS — N186 End stage renal disease: Secondary | ICD-10-CM | POA: Diagnosis not present

## 2016-02-22 DIAGNOSIS — M6281 Muscle weakness (generalized): Secondary | ICD-10-CM | POA: Diagnosis not present

## 2016-02-22 DIAGNOSIS — E1122 Type 2 diabetes mellitus with diabetic chronic kidney disease: Secondary | ICD-10-CM | POA: Diagnosis not present

## 2016-02-25 DIAGNOSIS — N2581 Secondary hyperparathyroidism of renal origin: Secondary | ICD-10-CM | POA: Diagnosis not present

## 2016-02-25 DIAGNOSIS — D631 Anemia in chronic kidney disease: Secondary | ICD-10-CM | POA: Diagnosis not present

## 2016-02-25 DIAGNOSIS — I10 Essential (primary) hypertension: Secondary | ICD-10-CM | POA: Diagnosis not present

## 2016-02-25 DIAGNOSIS — E119 Type 2 diabetes mellitus without complications: Secondary | ICD-10-CM | POA: Diagnosis not present

## 2016-02-25 DIAGNOSIS — E1122 Type 2 diabetes mellitus with diabetic chronic kidney disease: Secondary | ICD-10-CM | POA: Diagnosis not present

## 2016-02-25 DIAGNOSIS — N186 End stage renal disease: Secondary | ICD-10-CM | POA: Diagnosis not present

## 2016-02-25 DIAGNOSIS — M6281 Muscle weakness (generalized): Secondary | ICD-10-CM | POA: Diagnosis not present

## 2016-02-27 DIAGNOSIS — E119 Type 2 diabetes mellitus without complications: Secondary | ICD-10-CM | POA: Diagnosis not present

## 2016-02-27 DIAGNOSIS — M6281 Muscle weakness (generalized): Secondary | ICD-10-CM | POA: Diagnosis not present

## 2016-02-27 DIAGNOSIS — D631 Anemia in chronic kidney disease: Secondary | ICD-10-CM | POA: Diagnosis not present

## 2016-02-27 DIAGNOSIS — N2581 Secondary hyperparathyroidism of renal origin: Secondary | ICD-10-CM | POA: Diagnosis not present

## 2016-02-27 DIAGNOSIS — N186 End stage renal disease: Secondary | ICD-10-CM | POA: Diagnosis not present

## 2016-02-27 DIAGNOSIS — E1122 Type 2 diabetes mellitus with diabetic chronic kidney disease: Secondary | ICD-10-CM | POA: Diagnosis not present

## 2016-02-27 DIAGNOSIS — I10 Essential (primary) hypertension: Secondary | ICD-10-CM | POA: Diagnosis not present

## 2016-02-28 DIAGNOSIS — E119 Type 2 diabetes mellitus without complications: Secondary | ICD-10-CM | POA: Diagnosis not present

## 2016-02-28 DIAGNOSIS — I10 Essential (primary) hypertension: Secondary | ICD-10-CM | POA: Diagnosis not present

## 2016-02-28 DIAGNOSIS — M6281 Muscle weakness (generalized): Secondary | ICD-10-CM | POA: Diagnosis not present

## 2016-02-29 DIAGNOSIS — N2581 Secondary hyperparathyroidism of renal origin: Secondary | ICD-10-CM | POA: Diagnosis not present

## 2016-02-29 DIAGNOSIS — E1122 Type 2 diabetes mellitus with diabetic chronic kidney disease: Secondary | ICD-10-CM | POA: Diagnosis not present

## 2016-02-29 DIAGNOSIS — D631 Anemia in chronic kidney disease: Secondary | ICD-10-CM | POA: Diagnosis not present

## 2016-02-29 DIAGNOSIS — N186 End stage renal disease: Secondary | ICD-10-CM | POA: Diagnosis not present

## 2016-03-03 DIAGNOSIS — D631 Anemia in chronic kidney disease: Secondary | ICD-10-CM | POA: Diagnosis not present

## 2016-03-03 DIAGNOSIS — N2581 Secondary hyperparathyroidism of renal origin: Secondary | ICD-10-CM | POA: Diagnosis not present

## 2016-03-03 DIAGNOSIS — N186 End stage renal disease: Secondary | ICD-10-CM | POA: Diagnosis not present

## 2016-03-03 DIAGNOSIS — E1122 Type 2 diabetes mellitus with diabetic chronic kidney disease: Secondary | ICD-10-CM | POA: Diagnosis not present

## 2016-03-04 DIAGNOSIS — I10 Essential (primary) hypertension: Secondary | ICD-10-CM | POA: Diagnosis not present

## 2016-03-04 DIAGNOSIS — M6281 Muscle weakness (generalized): Secondary | ICD-10-CM | POA: Diagnosis not present

## 2016-03-04 DIAGNOSIS — E119 Type 2 diabetes mellitus without complications: Secondary | ICD-10-CM | POA: Diagnosis not present

## 2016-03-05 ENCOUNTER — Encounter: Payer: Self-pay | Admitting: Adult Health

## 2016-03-05 ENCOUNTER — Non-Acute Institutional Stay (SKILLED_NURSING_FACILITY): Payer: Medicare Other | Admitting: Adult Health

## 2016-03-05 DIAGNOSIS — E785 Hyperlipidemia, unspecified: Secondary | ICD-10-CM

## 2016-03-05 DIAGNOSIS — I480 Paroxysmal atrial fibrillation: Secondary | ICD-10-CM

## 2016-03-05 DIAGNOSIS — D638 Anemia in other chronic diseases classified elsewhere: Secondary | ICD-10-CM | POA: Diagnosis not present

## 2016-03-05 DIAGNOSIS — F419 Anxiety disorder, unspecified: Secondary | ICD-10-CM | POA: Diagnosis not present

## 2016-03-05 DIAGNOSIS — K2990 Gastroduodenitis, unspecified, without bleeding: Secondary | ICD-10-CM

## 2016-03-05 DIAGNOSIS — E1122 Type 2 diabetes mellitus with diabetic chronic kidney disease: Secondary | ICD-10-CM | POA: Diagnosis not present

## 2016-03-05 DIAGNOSIS — I150 Renovascular hypertension: Secondary | ICD-10-CM | POA: Diagnosis not present

## 2016-03-05 DIAGNOSIS — G4700 Insomnia, unspecified: Secondary | ICD-10-CM

## 2016-03-05 DIAGNOSIS — N184 Chronic kidney disease, stage 4 (severe): Secondary | ICD-10-CM

## 2016-03-05 DIAGNOSIS — I953 Hypotension of hemodialysis: Secondary | ICD-10-CM

## 2016-03-05 DIAGNOSIS — Z794 Long term (current) use of insulin: Secondary | ICD-10-CM

## 2016-03-05 DIAGNOSIS — K589 Irritable bowel syndrome without diarrhea: Secondary | ICD-10-CM | POA: Diagnosis not present

## 2016-03-05 DIAGNOSIS — N186 End stage renal disease: Secondary | ICD-10-CM

## 2016-03-05 DIAGNOSIS — R42 Dizziness and giddiness: Secondary | ICD-10-CM | POA: Diagnosis not present

## 2016-03-05 DIAGNOSIS — K2970 Gastritis, unspecified, without bleeding: Secondary | ICD-10-CM

## 2016-03-05 DIAGNOSIS — Z992 Dependence on renal dialysis: Secondary | ICD-10-CM

## 2016-03-05 DIAGNOSIS — N2581 Secondary hyperparathyroidism of renal origin: Secondary | ICD-10-CM | POA: Diagnosis not present

## 2016-03-05 DIAGNOSIS — D631 Anemia in chronic kidney disease: Secondary | ICD-10-CM | POA: Diagnosis not present

## 2016-03-05 NOTE — Progress Notes (Signed)
Patient ID: [name] James, female   DOB: 12/02/1936, 80 y.o.   MRN: [id number]     DATE:     03/05/16   Facility:  Nursing Home Location:  [place] Room Number: 205-2 LEVEL OF CARE:  SNF (31)   Chief Complaint  Patient presents with  . Medical Management of Chronic Issues    HISTORY OF PRESENT ILLNESS:  This is a 80 year old female who is being seen for a routine visit. She is a long-term resident  @ [facility]. Pressure ulcer to sacrum is now resolved. Latest hgb 9.5 and continues to take Aranesp on Wednesdays during dialysis days. Her heart rate is well-controlled and continues to take Amiodarone and Metoprolol for atrial fibrillation. She has ESRD and goes to dialysis 3X/week.   PAST MEDICAL HISTORY:  Past Medical History  Diagnosis Date  . CHF (congestive heart failure) ([place]) 05/13/2011    echo - [id number]; stage 1 diastolic dysfunction; elevated LV filling pressure, MAC  . Chest pain, atypical 12/05/2008    R/Lmv- normal perfusion all regions, noe ECG changes   . Claudication ([place]) 10/30/2005    doppler - normal evaluation, no evidence of aneurysm, diameter reduction, dissection, compression or vascular abnormality)  . Type 2 diabetes mellitus with diabetic neuropathy, with long-term current use of insulin ([place])   . Peripheral neuropathy ([place])   . Hypertension   . Edema   . Gout   . Anxiety   . Cataract   . GERD (gastroesophageal reflux disease)   . Complication of anesthesia   . PONV (postoperative nausea and vomiting)   . Family history of adverse reaction to anesthesia     son also has nausea   . Dyslipidemia   . Anemia   . Morbid obesity ([place])   . Atrial fibrillation ([place])   . Barrett esophagus   . Renal insufficiency 10/30/2005    doppler - abn resistance consistent w/ parenchymal disease  . CKD (chronic kidney disease), stage III   . Renal cyst   . Arthritis     knees  . Breast cancer ([place]) 1980s    bil mastectomies, no  radiation or chemo  . Dysrhythmia     Atrial Fibrillation  . Renovascular hypertension   . ESRD (end stage renal disease) ([place])   . Gastritis and gastroduodenitis   . Insomnia   . Vertigo   . IBS (irritable bowel syndrome)   . Anemia, chronic disease   . HLD (hyperlipidemia)     CURRENT MEDICATIONS: Reviewed per MAR/see medication list   Medication List       This list is accurate as of: 03/05/16 11:59 PM.  Always use your most recent med list.               allopurinol 100 MG tablet  Commonly known as:  ZYLOPRIM  Take 100 mg by mouth daily.     amiodarone 200 MG tablet  Commonly known as:  PACERONE  Take 200 mg by mouth daily.     aspirin 325 MG EC tablet  Take 325 mg by mouth daily.     atorvastatin 80 MG tablet  Commonly known as:  LIPITOR  Take 1 tablet (80 mg total) by mouth daily at 6 PM.     DECUBI-VITE Caps  Take 1 capsule by mouth daily for wound healing     diazepam 5 MG tablet  Commonly known as:  VALIUM  Take 5 mg by mouth at bedtime.  dicyclomine 20 MG tablet  Commonly known as:  BENTYL  Take 1 tablet (20 mg total) by mouth 3 (three) times daily.     dimethicone-zinc oxide cream  Apply topically daily as needed for dry skin (Apply topically to buttock and sacrum).     dorzolamide-timolol 22.3-6.8 MG/ML ophthalmic solution  Commonly known as:  COSOPT  Place 1 drop into the left eye 2 (two) times daily. Wait 2-5 minutes when giving two eye meds     folic acid 1 MG tablet  Commonly known as:  FOLVITE  Take 1 mg by mouth daily.     insulin lispro 100 UNIT/ML injection  Commonly known as:  HUMALOG  Inject 0-12 Units into the skin 3 (three) times daily with meals. Per sliding scale:  150-200 = 2 units, 201-250 = 4 units, 251-300  = 6 units, 301-350 = 8 units, 351-400 = 10 units, >400 give 12 units and call MD.     loperamide 2 MG tablet  Commonly known as:  IMODIUM A-D  Take 2 mg by mouth 3 (three) times daily as needed for diarrhea or loose  stools. May take one dose a day and refuse the other two if not needed.     loratadine 10 MG tablet  Commonly known as:  CLARITIN  Take 10 mg by mouth daily as needed for allergies.     meclizine 12.5 MG tablet  Commonly known as:  ANTIVERT  Take 12.5 mg by mouth 2 (two) times daily as needed for dizziness.     Melatonin 5 MG Tabs  Take 5 mg by mouth at bedtime.     metoprolol 50 MG tablet  Commonly known as:  LOPRESSOR  Take 50 mg by mouth 2 (two) times daily. Hold for blood pressure <110 and heart rate <60. Hold on dialysis days.     midodrine 10 MG tablet  Commonly known as:  PROAMATINE  Take 10 mg by mouth every Monday, Wednesday, and Friday. On dialysis days     NEPHRO-VITE PO  Take 1 tablet by mouth at bedtime.     NUTRITIONAL DRINK MIX PO  Take 1 each by mouth 3 (three) times daily. Mighty shake TID with MedPass (10P, 2P, 8P)     ondansetron 4 MG disintegrating tablet  Commonly known as:  ZOFRAN-ODT  Take 4 mg by mouth every 6 (six) hours as needed for nausea or vomiting. PER MED LIST FROM [facility], PT IS TO TAKE EVERY 6 HOURS AROUND THE CLOCK     oxyCODONE 5 MG immediate release tablet  Commonly known as:  Oxy IR/ROXICODONE  Take 1 tablet (5 mg total) by mouth every 4 (four) hours as needed for severe pain.     OXYGEN  Inhale 2 L/min into the lungs continuous.     pantoprazole 40 MG tablet  Commonly known as:  PROTONIX  Take 40 mg by mouth 2 (two) times daily.     UNABLE TO FIND  Med Name: Preparation H CRM Aloe.  Apply to external hemorrhoids BID PRN.         Allergies  Allergen Reactions  . Erythromycin Swelling  . Erythromycin Base     Other reaction(s): GI Upset (intolerance)  . Penicillins Other (See Comments)    Unknown allergic reaction.  She has taken keflex many times without problems  . Codeine Rash and Palpitations    Other reaction(s): GI Upset (intolerance)  . Penicillin G Rash   REVIEW OF SYSTEMS:  GENERAL: no fatigue,  no weight  changes, no fever, chills  RESPIRATORY: no cough, SOB, DOE, wheezing, hemoptysis CARDIAC: no chest pain GI: no abdominal pain, diarrhea, constipation, heart burn, nausea or vomiting  PHYSICAL EXAMINATION  GENERAL: no acute distress, obese SKIN:  Skin is warm and dry. NECK: supple, trachea midline, no neck masses, no thyroid tenderness, no thyromegaly LYMPHATICS: no LAN in the neck, no supraclavicular LAN RESPIRATORY: breathing is even & unlabored, BS CTAB CARDIAC: RRR, no murmur,no extra heart sounds, BLE edema 1+ GI: abdomen soft, normal BS, no masses, no tenderness, no hepatomegaly, no splenomegaly EXTREMITIES: Able to move all 4 extremities; Left wrist AV fistula + for bruit and thrill PSYCHIATRIC: the patient is alert & oriented to person, affect & behavior appropriate  LABS/RADIOLOGY: Labs reviewed: 12/31/15  Uric acid 4.1 11/26/15  Pancreatic elastase-1  >500 09/21/15  WBC 9.4 hemoglobin 9.1 hematocrit 28.7 MCV 95.7 platelet 102 09/19/15  WBC 5.2 hemoglobin 10.7 hematocrit 28.3  MCV 94.6 platelet 229 09/17/15  Fecal fat qualitative, normal 08/14/15  WBC 8.4 hemoglobin 11.7 hematocrit 35.6 mCV 93.0  Platelet 175 sodium 143 potassium 4.7   Glucose 87  BUN 12  Creatinine 2.37  Calcium 8.0 07/23/15  Hemoglobin A1c 5.1 Basic Metabolic Panel:  Recent Labs  03/20/15 1112 04/12/15 0616 04/23/15 12/06/15  NA 141 141 142 143  143  K 3.7 3.0* 3.5 3.9  3.9  GLUCOSE 149* 158*  --   --   BUN  --   --  26* 12  12  CREATININE  --   --  4.7* 2.1*  2.1*   Liver Function Tests:  Recent Labs  04/23/15 12/06/15  AST 28 33  33  ALT 17 25  25   ALKPHOS 84 103  103    CBC:  Recent Labs  03/20/15 1112 04/12/15 0616 12/06/15  WBC  --   --  4.1  4.1  NEUTROABS  --   --  1  1  HGB 10.9* 11.9* 9.5*  9.5*  HCT 32.0* 35.0* 32*  32*  PLT  --   --  125*  125*   CBG:  Recent Labs  03/20/15 1519 04/12/15 0951  GLUCAP 124* 135*     ASSESSMENT/PLAN:  Diabetes mellitus  with renal complication - hemoglobin A1c 5.1well-controlled; continue Humalog sliding scale 3 times a day with meals; check HgbA1c  Gastritis - recently increased  Protonix to 40 mg by mouth PO BID  Anemia of chronic disease - continue Aranesp 150 g/0.3 mL IV every Wednesdays during hemodialysis; check CBC  IBS - continue Bentyl 20 mg 1 tab by mouth 3 times a day  Atrial fibrillation - rate controlled; continue amiodarone 200 mg by mouth  daily and Lopressor 50 mg by mouth twice a day; aspirin 325 mg by mouth daily, not a candidate for Coumadin   ESRD - on hemodialysis Mondays, Wednesdays and Fridays; continue fluid restriction 1800 mL/24 hour  Hyperlipidemia - continue Lipitor 80 mg 1 tab by mouth every 6 p.m.; check lipid panel  Insomnia - continue melatonin 5 mg by mouth daily at bedtime  Renovascular hypertension - well controlled; continue Lopressor 50 mg by mouth twice a day; check CMP  Hemodialysis associated hypotension - continue midodrine 10 mg by mouth every Mondays, Wednesdays and Fridays (dialysis days)  Anxiety - mood is stable; continue Valium 5 mg by mouth daily at bedtime  Vertigo - continue Antivert 12.5 mg PO Q 12 H PRN    Goals of care:  Long-term care  [facility] [place], NP [employer] [phone]

## 2016-03-07 DIAGNOSIS — D631 Anemia in chronic kidney disease: Secondary | ICD-10-CM | POA: Diagnosis not present

## 2016-03-07 DIAGNOSIS — E1122 Type 2 diabetes mellitus with diabetic chronic kidney disease: Secondary | ICD-10-CM | POA: Diagnosis not present

## 2016-03-07 DIAGNOSIS — N186 End stage renal disease: Secondary | ICD-10-CM | POA: Diagnosis not present

## 2016-03-07 DIAGNOSIS — N2581 Secondary hyperparathyroidism of renal origin: Secondary | ICD-10-CM | POA: Diagnosis not present

## 2016-03-10 DIAGNOSIS — Z79899 Other long term (current) drug therapy: Secondary | ICD-10-CM | POA: Diagnosis not present

## 2016-03-10 DIAGNOSIS — N186 End stage renal disease: Secondary | ICD-10-CM | POA: Diagnosis not present

## 2016-03-10 DIAGNOSIS — N2581 Secondary hyperparathyroidism of renal origin: Secondary | ICD-10-CM | POA: Diagnosis not present

## 2016-03-10 DIAGNOSIS — D631 Anemia in chronic kidney disease: Secondary | ICD-10-CM | POA: Diagnosis not present

## 2016-03-10 DIAGNOSIS — E1122 Type 2 diabetes mellitus with diabetic chronic kidney disease: Secondary | ICD-10-CM | POA: Diagnosis not present

## 2016-03-10 LAB — CBC AND DIFFERENTIAL
HCT: 36 % (ref 36–46)
HEMOGLOBIN: 10.9 g/dL — AB (ref 12.0–16.0)
NEUTROS ABS: 3 /uL
PLATELETS: 156 10*3/uL (ref 150–399)
WBC: 6.7 10^3/mL

## 2016-03-10 LAB — BASIC METABOLIC PANEL
BUN: 30 mg/dL — AB (ref 4–21)
Creatinine: 4.7 mg/dL — AB (ref 0.5–1.1)
Glucose: 110 mg/dL
POTASSIUM: 5 mmol/L (ref 3.4–5.3)
SODIUM: 143 mmol/L (ref 137–147)

## 2016-03-10 LAB — HEPATIC FUNCTION PANEL
ALK PHOS: 153 U/L — AB (ref 25–125)
ALT: 34 U/L (ref 7–35)
AST: 44 U/L — AB (ref 13–35)
BILIRUBIN, TOTAL: 0.4 mg/dL

## 2016-03-10 LAB — HEMOGLOBIN A1C: Hemoglobin A1C: 4.5

## 2016-03-12 DIAGNOSIS — N2581 Secondary hyperparathyroidism of renal origin: Secondary | ICD-10-CM | POA: Diagnosis not present

## 2016-03-12 DIAGNOSIS — D631 Anemia in chronic kidney disease: Secondary | ICD-10-CM | POA: Diagnosis not present

## 2016-03-12 DIAGNOSIS — E1122 Type 2 diabetes mellitus with diabetic chronic kidney disease: Secondary | ICD-10-CM | POA: Diagnosis not present

## 2016-03-12 DIAGNOSIS — N186 End stage renal disease: Secondary | ICD-10-CM | POA: Diagnosis not present

## 2016-03-14 ENCOUNTER — Non-Acute Institutional Stay (SKILLED_NURSING_FACILITY): Payer: Medicare Other | Admitting: Adult Health

## 2016-03-14 ENCOUNTER — Encounter: Payer: Self-pay | Admitting: Adult Health

## 2016-03-14 DIAGNOSIS — D631 Anemia in chronic kidney disease: Secondary | ICD-10-CM | POA: Diagnosis not present

## 2016-03-14 DIAGNOSIS — E1122 Type 2 diabetes mellitus with diabetic chronic kidney disease: Secondary | ICD-10-CM | POA: Diagnosis not present

## 2016-03-14 DIAGNOSIS — N186 End stage renal disease: Secondary | ICD-10-CM | POA: Diagnosis not present

## 2016-03-14 DIAGNOSIS — N2581 Secondary hyperparathyroidism of renal origin: Secondary | ICD-10-CM | POA: Diagnosis not present

## 2016-03-14 DIAGNOSIS — Z794 Long term (current) use of insulin: Secondary | ICD-10-CM | POA: Diagnosis not present

## 2016-03-14 DIAGNOSIS — N184 Chronic kidney disease, stage 4 (severe): Secondary | ICD-10-CM | POA: Diagnosis not present

## 2016-03-14 NOTE — Progress Notes (Signed)
Patient ID: [name] James, female   DOB: 24-Nov-1936, 80 y.o.   MRN: [id number]   DATE:     03/14/16   Facility:  Nursing Home Location:  [place] Room Number: 205-2 LEVEL OF CARE:  SNF (31)   Chief Complaint  Patient presents with  . Acute Visit    Diabetes management    HISTORY OF PRESENT ILLNESS:  This is a 80 year old female who was noted to have hgbA1c 4.5  . She is currently on Humalog sliding scale TIDac. Glucose on CMP noted to be 110. Patient has no complaints of dizziness.   PAST MEDICAL HISTORY:  Past Medical History  Diagnosis Date  . CHF (congestive heart failure) ([place]) 05/13/2011    echo - [id number]; stage 1 diastolic dysfunction; elevated LV filling pressure, MAC  . Chest pain, atypical 12/05/2008    R/Lmv- normal perfusion all regions, noe ECG changes   . Claudication ([place]) 10/30/2005    doppler - normal evaluation, no evidence of aneurysm, diameter reduction, dissection, compression or vascular abnormality)  . Type 2 diabetes mellitus with diabetic neuropathy, with long-term current use of insulin ([place])   . Peripheral neuropathy ([place])   . Hypertension   . Edema   . Gout   . Anxiety   . Cataract   . GERD (gastroesophageal reflux disease)   . Complication of anesthesia   . PONV (postoperative nausea and vomiting)   . Family history of adverse reaction to anesthesia     son also has nausea   . Dyslipidemia   . Anemia   . Morbid obesity ([place])   . Atrial fibrillation ([place])   . Barrett esophagus   . Renal insufficiency 10/30/2005    doppler - abn resistance consistent w/ parenchymal disease  . CKD (chronic kidney disease), stage III   . Renal cyst   . Arthritis     knees  . Breast cancer ([place]) 1980s    bil mastectomies, no radiation or chemo  . Dysrhythmia     Atrial Fibrillation  . Renovascular hypertension   . ESRD (end stage renal disease) ([place])   . Gastritis and gastroduodenitis   . Insomnia   . Vertigo   . IBS  (irritable bowel syndrome)   . Anemia, chronic disease   . HLD (hyperlipidemia)     CURRENT MEDICATIONS: Reviewed per MAR/see medication list   Medication List       This list is accurate as of: 03/14/16  9:53 PM.  Always use your most recent med list.               allopurinol 100 MG tablet  Commonly known as:  ZYLOPRIM  Take 100 mg by mouth daily.     amiodarone 200 MG tablet  Commonly known as:  PACERONE  Take 200 mg by mouth daily.     aspirin 325 MG EC tablet  Take 325 mg by mouth daily.     atorvastatin 80 MG tablet  Commonly known as:  LIPITOR  Take 1 tablet (80 mg total) by mouth daily at 6 PM.     diazepam 5 MG tablet  Commonly known as:  VALIUM  Take 5 mg by mouth at bedtime.     dicyclomine 20 MG tablet  Commonly known as:  BENTYL  Take 1 tablet (20 mg total) by mouth 3 (three) times daily.     dimethicone-zinc oxide cream  Apply topically daily as needed for dry skin (Apply topically  to buttock and sacrum).     dorzolamide-timolol 22.3-6.8 MG/ML ophthalmic solution  Commonly known as:  COSOPT  Place 1 drop into the left eye 2 (two) times daily. Wait 2-5 minutes when giving two eye meds     folic acid 1 MG tablet  Commonly known as:  FOLVITE  Take 1 mg by mouth daily.     loperamide 2 MG tablet  Commonly known as:  IMODIUM A-D  Take 2 mg by mouth 3 (three) times daily as needed for diarrhea or loose stools. May take one dose a day and refuse the other two if not needed.     loratadine 10 MG tablet  Commonly known as:  CLARITIN  Take 10 mg by mouth daily as needed for allergies.     meclizine 12.5 MG tablet  Commonly known as:  ANTIVERT  Take 12.5 mg by mouth 2 (two) times daily as needed for dizziness.     Melatonin 5 MG Tabs  Take 5 mg by mouth at bedtime.     metoprolol 50 MG tablet  Commonly known as:  LOPRESSOR  Take 50 mg by mouth 2 (two) times daily. Hold for blood pressure <110 and heart rate <60. Hold on dialysis days.      midodrine 10 MG tablet  Commonly known as:  PROAMATINE  Take 10 mg by mouth every Monday, Wednesday, and Friday. On dialysis days     NEPHRO-VITE PO  Take 1 tablet by mouth at bedtime.     NUTRITIONAL DRINK MIX PO  Take 1 each by mouth 3 (three) times daily. Mighty shake TID with MedPass (10P, 2P, 8P)     ondansetron 4 MG disintegrating tablet  Commonly known as:  ZOFRAN-ODT  Take 4 mg by mouth every 6 (six) hours as needed for nausea or vomiting.     oxyCODONE 5 MG immediate release tablet  Commonly known as:  Oxy IR/ROXICODONE  Take 1 tablet (5 mg total) by mouth every 4 (four) hours as needed for severe pain.     OXYGEN  Inhale 2 L/min into the lungs continuous.     pantoprazole 40 MG tablet  Commonly known as:  PROTONIX  Take 40 mg by mouth 2 (two) times daily.     UNABLE TO FIND  Med Name: Preparation H CRM Aloe.  Apply to external hemorrhoids BID PRN.         Allergies  Allergen Reactions  . Erythromycin Swelling  . Erythromycin Base     Other reaction(s): GI Upset (intolerance)  . Penicillins Other (See Comments)    Unknown allergic reaction.  She has taken keflex many times without problems  . Codeine Rash and Palpitations    Other reaction(s): GI Upset (intolerance)  . Penicillin G Rash   REVIEW OF SYSTEMS:  GENERAL: no fatigue, no weight changes, no fever, chills  RESPIRATORY: no cough, SOB, DOE, wheezing, hemoptysis CARDIAC: no chest pain GI: no abdominal pain, diarrhea, constipation, heart burn, nausea or vomiting  PHYSICAL EXAMINATION  GENERAL: no acute distress, obese SKIN:  Skin is warm and dry. NECK: supple, trachea midline, no neck masses, no thyroid tenderness, no thyromegaly LYMPHATICS: no LAN in the neck, no supraclavicular LAN RESPIRATORY: breathing is even & unlabored, BS CTAB CARDIAC: RRR, no murmur,no extra heart sounds, BLE edema 1+ GI: abdomen soft, normal BS, no masses, no tenderness, no hepatomegaly, no splenomegaly EXTREMITIES:  Able to move all 4 extremities; Left wrist AV fistula + for bruit and thrill PSYCHIATRIC: the  patient is alert & oriented to person, affect & behavior appropriate  LABS/RADIOLOGY: Labs reviewed: 03/10/16  hgbA1c 4.5   Cardiolipin IgG 16   cardiolipin IgM <12   cardiolipin IgA <11   Phosphatidylserine IgG 17.0   Phosphatidylserine IgM <25   Phosphatidylserine IgA <20  12/31/15  Uric acid 4.1 11/26/15  Pancreatic elastase-1  >500 09/21/15  WBC 9.4 hemoglobin 9.1 hematocrit 28.7 MCV 95.7 platelet 102 09/19/15  WBC 5.2 hemoglobin 10.7 hematocrit 28.3  MCV 94.6 platelet 229 09/17/15  Fecal fat qualitative, normal 08/14/15  WBC 8.4 hemoglobin 11.7 hematocrit 35.6 mCV 93.0  Platelet 175 sodium 143 potassium 4.7   Glucose 87  BUN 12  Creatinine 2.37  Calcium 8.0 07/23/15  Hemoglobin A1c 5.1 Basic Metabolic Panel:  Recent Labs  03/20/15 1112 04/12/15 0616 04/23/15 12/06/15 03/10/16  NA 141 141 142 143  143 143  K 3.7 3.0* 3.5 3.9  3.9 5.0  GLUCOSE 149* 158*  --   --   --   BUN  --   --  26* 12  12 30*  CREATININE  --   --  4.7* 2.1*  2.1* 4.7*   Liver Function Tests:  Recent Labs  04/23/15 12/06/15 03/10/16  AST 28 33  33 44*  ALT 17 25  25  34  ALKPHOS 84 103  103 153*    CBC:  Recent Labs  04/12/15 0616 12/06/15 03/10/16  WBC  --  4.1  4.1 6.7  NEUTROABS  --  1  1 3   HGB 11.9* 9.5*  9.5* 10.9*  HCT 35.0* 32*  32* 36  PLT  --  125*  125* 156   CBG:  Recent Labs  03/20/15 1519 04/12/15 0951  GLUCAP 124* 135*     ASSESSMENT/PLAN:  Diabetes mellitus with renal complication - hemoglobin A1c 4.5; discontinue Humalog sliding scale; dc CBG ACHS, start CBG daily X 1 week    [facility], NP [place]

## 2016-03-17 DIAGNOSIS — N2581 Secondary hyperparathyroidism of renal origin: Secondary | ICD-10-CM | POA: Diagnosis not present

## 2016-03-17 DIAGNOSIS — N186 End stage renal disease: Secondary | ICD-10-CM | POA: Diagnosis not present

## 2016-03-17 DIAGNOSIS — E1122 Type 2 diabetes mellitus with diabetic chronic kidney disease: Secondary | ICD-10-CM | POA: Diagnosis not present

## 2016-03-17 DIAGNOSIS — D631 Anemia in chronic kidney disease: Secondary | ICD-10-CM | POA: Diagnosis not present

## 2016-03-19 DIAGNOSIS — D631 Anemia in chronic kidney disease: Secondary | ICD-10-CM | POA: Diagnosis not present

## 2016-03-19 DIAGNOSIS — N2581 Secondary hyperparathyroidism of renal origin: Secondary | ICD-10-CM | POA: Diagnosis not present

## 2016-03-19 DIAGNOSIS — E1122 Type 2 diabetes mellitus with diabetic chronic kidney disease: Secondary | ICD-10-CM | POA: Diagnosis not present

## 2016-03-19 DIAGNOSIS — N186 End stage renal disease: Secondary | ICD-10-CM | POA: Diagnosis not present

## 2016-03-21 DIAGNOSIS — D631 Anemia in chronic kidney disease: Secondary | ICD-10-CM | POA: Diagnosis not present

## 2016-03-21 DIAGNOSIS — N2581 Secondary hyperparathyroidism of renal origin: Secondary | ICD-10-CM | POA: Diagnosis not present

## 2016-03-21 DIAGNOSIS — N186 End stage renal disease: Secondary | ICD-10-CM | POA: Diagnosis not present

## 2016-03-21 DIAGNOSIS — Z992 Dependence on renal dialysis: Secondary | ICD-10-CM | POA: Diagnosis not present

## 2016-03-21 DIAGNOSIS — E1122 Type 2 diabetes mellitus with diabetic chronic kidney disease: Secondary | ICD-10-CM | POA: Diagnosis not present

## 2016-03-24 DIAGNOSIS — Z23 Encounter for immunization: Secondary | ICD-10-CM | POA: Diagnosis not present

## 2016-03-24 DIAGNOSIS — D509 Iron deficiency anemia, unspecified: Secondary | ICD-10-CM | POA: Diagnosis not present

## 2016-03-24 DIAGNOSIS — N186 End stage renal disease: Secondary | ICD-10-CM | POA: Diagnosis not present

## 2016-03-24 DIAGNOSIS — E1122 Type 2 diabetes mellitus with diabetic chronic kidney disease: Secondary | ICD-10-CM | POA: Diagnosis not present

## 2016-03-25 DIAGNOSIS — M25579 Pain in unspecified ankle and joints of unspecified foot: Secondary | ICD-10-CM | POA: Diagnosis not present

## 2016-03-26 DIAGNOSIS — N186 End stage renal disease: Secondary | ICD-10-CM | POA: Diagnosis not present

## 2016-03-26 DIAGNOSIS — Z23 Encounter for immunization: Secondary | ICD-10-CM | POA: Diagnosis not present

## 2016-03-26 DIAGNOSIS — D509 Iron deficiency anemia, unspecified: Secondary | ICD-10-CM | POA: Diagnosis not present

## 2016-03-26 DIAGNOSIS — E1122 Type 2 diabetes mellitus with diabetic chronic kidney disease: Secondary | ICD-10-CM | POA: Diagnosis not present

## 2016-03-28 DIAGNOSIS — D509 Iron deficiency anemia, unspecified: Secondary | ICD-10-CM | POA: Diagnosis not present

## 2016-03-28 DIAGNOSIS — N186 End stage renal disease: Secondary | ICD-10-CM | POA: Diagnosis not present

## 2016-03-28 DIAGNOSIS — E1122 Type 2 diabetes mellitus with diabetic chronic kidney disease: Secondary | ICD-10-CM | POA: Diagnosis not present

## 2016-03-28 DIAGNOSIS — Z23 Encounter for immunization: Secondary | ICD-10-CM | POA: Diagnosis not present

## 2016-03-31 DIAGNOSIS — D509 Iron deficiency anemia, unspecified: Secondary | ICD-10-CM | POA: Diagnosis not present

## 2016-03-31 DIAGNOSIS — N186 End stage renal disease: Secondary | ICD-10-CM | POA: Diagnosis not present

## 2016-03-31 DIAGNOSIS — E1122 Type 2 diabetes mellitus with diabetic chronic kidney disease: Secondary | ICD-10-CM | POA: Diagnosis not present

## 2016-03-31 DIAGNOSIS — Z23 Encounter for immunization: Secondary | ICD-10-CM | POA: Diagnosis not present

## 2016-04-02 DIAGNOSIS — E1122 Type 2 diabetes mellitus with diabetic chronic kidney disease: Secondary | ICD-10-CM | POA: Diagnosis not present

## 2016-04-02 DIAGNOSIS — Z23 Encounter for immunization: Secondary | ICD-10-CM | POA: Diagnosis not present

## 2016-04-02 DIAGNOSIS — D509 Iron deficiency anemia, unspecified: Secondary | ICD-10-CM | POA: Diagnosis not present

## 2016-04-02 DIAGNOSIS — N186 End stage renal disease: Secondary | ICD-10-CM | POA: Diagnosis not present

## 2016-04-04 DIAGNOSIS — Z23 Encounter for immunization: Secondary | ICD-10-CM | POA: Diagnosis not present

## 2016-04-04 DIAGNOSIS — N186 End stage renal disease: Secondary | ICD-10-CM | POA: Diagnosis not present

## 2016-04-04 DIAGNOSIS — D509 Iron deficiency anemia, unspecified: Secondary | ICD-10-CM | POA: Diagnosis not present

## 2016-04-04 DIAGNOSIS — E1122 Type 2 diabetes mellitus with diabetic chronic kidney disease: Secondary | ICD-10-CM | POA: Diagnosis not present

## 2016-04-07 DIAGNOSIS — Z23 Encounter for immunization: Secondary | ICD-10-CM | POA: Diagnosis not present

## 2016-04-07 DIAGNOSIS — N186 End stage renal disease: Secondary | ICD-10-CM | POA: Diagnosis not present

## 2016-04-07 DIAGNOSIS — D509 Iron deficiency anemia, unspecified: Secondary | ICD-10-CM | POA: Diagnosis not present

## 2016-04-07 DIAGNOSIS — E1122 Type 2 diabetes mellitus with diabetic chronic kidney disease: Secondary | ICD-10-CM | POA: Diagnosis not present

## 2016-04-09 DIAGNOSIS — D509 Iron deficiency anemia, unspecified: Secondary | ICD-10-CM | POA: Diagnosis not present

## 2016-04-09 DIAGNOSIS — E1122 Type 2 diabetes mellitus with diabetic chronic kidney disease: Secondary | ICD-10-CM | POA: Diagnosis not present

## 2016-04-09 DIAGNOSIS — N186 End stage renal disease: Secondary | ICD-10-CM | POA: Diagnosis not present

## 2016-04-09 DIAGNOSIS — Z23 Encounter for immunization: Secondary | ICD-10-CM | POA: Diagnosis not present

## 2016-04-11 DIAGNOSIS — N186 End stage renal disease: Secondary | ICD-10-CM | POA: Diagnosis not present

## 2016-04-11 DIAGNOSIS — D509 Iron deficiency anemia, unspecified: Secondary | ICD-10-CM | POA: Diagnosis not present

## 2016-04-11 DIAGNOSIS — E1122 Type 2 diabetes mellitus with diabetic chronic kidney disease: Secondary | ICD-10-CM | POA: Diagnosis not present

## 2016-04-11 DIAGNOSIS — Z23 Encounter for immunization: Secondary | ICD-10-CM | POA: Diagnosis not present

## 2016-04-14 DIAGNOSIS — D509 Iron deficiency anemia, unspecified: Secondary | ICD-10-CM | POA: Diagnosis not present

## 2016-04-14 DIAGNOSIS — E1122 Type 2 diabetes mellitus with diabetic chronic kidney disease: Secondary | ICD-10-CM | POA: Diagnosis not present

## 2016-04-14 DIAGNOSIS — N186 End stage renal disease: Secondary | ICD-10-CM | POA: Diagnosis not present

## 2016-04-14 DIAGNOSIS — Z23 Encounter for immunization: Secondary | ICD-10-CM | POA: Diagnosis not present

## 2016-04-16 DIAGNOSIS — I871 Compression of vein: Secondary | ICD-10-CM | POA: Diagnosis not present

## 2016-04-16 DIAGNOSIS — N186 End stage renal disease: Secondary | ICD-10-CM | POA: Diagnosis not present

## 2016-04-16 DIAGNOSIS — T82858D Stenosis of vascular prosthetic devices, implants and grafts, subsequent encounter: Secondary | ICD-10-CM | POA: Diagnosis not present

## 2016-04-16 DIAGNOSIS — Z992 Dependence on renal dialysis: Secondary | ICD-10-CM | POA: Diagnosis not present

## 2016-04-17 ENCOUNTER — Encounter: Payer: Self-pay | Admitting: Internal Medicine

## 2016-04-17 ENCOUNTER — Non-Acute Institutional Stay (SKILLED_NURSING_FACILITY): Payer: Medicare Other | Admitting: Internal Medicine

## 2016-04-17 DIAGNOSIS — I5042 Chronic combined systolic (congestive) and diastolic (congestive) heart failure: Secondary | ICD-10-CM

## 2016-04-17 DIAGNOSIS — Z794 Long term (current) use of insulin: Secondary | ICD-10-CM

## 2016-04-17 DIAGNOSIS — N186 End stage renal disease: Secondary | ICD-10-CM | POA: Diagnosis not present

## 2016-04-17 DIAGNOSIS — E1142 Type 2 diabetes mellitus with diabetic polyneuropathy: Secondary | ICD-10-CM | POA: Diagnosis not present

## 2016-04-17 DIAGNOSIS — I480 Paroxysmal atrial fibrillation: Secondary | ICD-10-CM

## 2016-04-17 DIAGNOSIS — I10 Essential (primary) hypertension: Secondary | ICD-10-CM

## 2016-04-17 DIAGNOSIS — E785 Hyperlipidemia, unspecified: Secondary | ICD-10-CM

## 2016-04-17 DIAGNOSIS — D509 Iron deficiency anemia, unspecified: Secondary | ICD-10-CM | POA: Diagnosis not present

## 2016-04-17 DIAGNOSIS — M8580 Other specified disorders of bone density and structure, unspecified site: Secondary | ICD-10-CM | POA: Insufficient documentation

## 2016-04-17 DIAGNOSIS — I953 Hypotension of hemodialysis: Secondary | ICD-10-CM

## 2016-04-17 DIAGNOSIS — K2990 Gastroduodenitis, unspecified, without bleeding: Secondary | ICD-10-CM | POA: Diagnosis not present

## 2016-04-17 DIAGNOSIS — K2970 Gastritis, unspecified, without bleeding: Secondary | ICD-10-CM

## 2016-04-17 DIAGNOSIS — E1122 Type 2 diabetes mellitus with diabetic chronic kidney disease: Secondary | ICD-10-CM | POA: Diagnosis not present

## 2016-04-17 DIAGNOSIS — Z23 Encounter for immunization: Secondary | ICD-10-CM | POA: Diagnosis not present

## 2016-04-17 HISTORY — DX: Type 2 diabetes mellitus with diabetic polyneuropathy: E11.42

## 2016-04-17 HISTORY — DX: Long term (current) use of insulin: Z79.4

## 2016-04-17 NOTE — Progress Notes (Signed)
Patient ID: HAYDYN MACKINS, female   DOB: 11-01-1936, 80 y.o.   MRN: [id number]     [place]  PCP: [name], MD  Code Status: Full Code   Allergies  Allergen Reactions  . Erythromycin Swelling  . Erythromycin Base     Other reaction(s): GI Upset (intolerance)  . Penicillins Other (See Comments)    Unknown allergic reaction.  She has taken keflex many times without problems  . Codeine Rash and Palpitations    Other reaction(s): GI Upset (intolerance)  . Penicillin G Rash    Chief Complaint  Patient presents with  . Medical Management of Chronic Issues    Routine Visit     HPI:  80 year old patient is seen today for routine visit. She is a long term care resident. She continues to get her HD 3 days a week. There was vascular access problem during her dialysis yesterday and she had a new vascular access placed and had dialysis today. She feels weak and tired post dialysis. Reviewed her CBG and BP reading. She had left foot pain with her left leg giving away in beginning of April and had xray which ruled out fracture. She has gained weight.  Review of Systems:  Constitutional: Negative for fever.  HENT: Negative for headache, congestion, nasal discharge Respiratory: Negative for cough, shortness of breath and wheezing.   Cardiovascular: Negative for chest pain, palpitations, leg swelling.  Gastrointestinal: Negative for heartburn, vomiting, abdominal pain. Has occasional nausea. Musculoskeletal: Negative for  Falls. Has foot pain. Skin: Negative for itching, rash.  Neurological: Negative for dizziness. Has numbness to her feet and leg Psychiatric/Behavioral: Negative for depression.    Past Medical History  Diagnosis Date  . CHF (congestive heart failure) ([place]) 05/13/2011    echo - [id number]; stage 1 diastolic dysfunction; elevated LV filling pressure, MAC  . Chest pain, atypical 12/05/2008    R/Lmv- normal perfusion all regions, noe ECG changes   .  Claudication ([place]) 10/30/2005    doppler - normal evaluation, no evidence of aneurysm, diameter reduction, dissection, compression or vascular abnormality)  . Type 2 diabetes mellitus with diabetic neuropathy, with long-term current use of insulin ([place])   . Peripheral neuropathy ([place])   . Hypertension   . Edema   . Gout   . Anxiety   . Cataract   . GERD (gastroesophageal reflux disease)   . Complication of anesthesia   . PONV (postoperative nausea and vomiting)   . Family history of adverse reaction to anesthesia     son also has nausea   . Dyslipidemia   . Anemia   . Morbid obesity ([place])   . Atrial fibrillation ([place])   . Barrett esophagus   . Renal insufficiency 10/30/2005    doppler - abn resistance consistent w/ parenchymal disease  . CKD (chronic kidney disease), stage III   . Renal cyst   . Arthritis     knees  . Breast cancer ([place]) 1980s    bil mastectomies, no radiation or chemo  . Dysrhythmia     Atrial Fibrillation  . Renovascular hypertension   . ESRD (end stage renal disease) ([place])   . Gastritis and gastroduodenitis   . Insomnia   . Vertigo   . IBS (irritable bowel syndrome)   . Anemia, chronic disease   . HLD (hyperlipidemia)    Past Surgical History  Procedure Laterality Date  . Mastectomy Bilateral ~1982    bil breast reconstruction with implants  .  Back surgery  ~1980  . Hemorrhoid surgery    . Cholecystectomy    . Orif tibia & fibula fractures Left 2007    also had left non displaced malleolar fracture.   . Esophagogastroduodenoscopy N/A 10/24/2014    Procedure: ESOPHAGOGASTRODUODENOSCOPY (EGD);  Surgeon: Jerene Bears, MD;  Location[facility] ENDOSCOPY;  Service: Endoscopy;  Laterality: N/A;  . Av fistula placement Left 11/30/2014    Procedure: ARTERIOVENOUS (AV) FISTULA CREATION LEFT ARM;  Surgeon: Angelia Mould, MD;  Location: [place];  Service: Vascular;  Laterality: Left;  . Exchange of a dialysis catheter Left 11/30/2014    Procedure: EXCHANGE OF  A DIALYSIS CATHETER, LEFT INTERNAL JUGULAR;  Surgeon: Angelia Mould, MD;  Location: [place];  Service: Vascular;  Laterality: Left;  . Fistulogram N/A 02/12/2015    Procedure: FISTULOGRAM;  Surgeon: Angelia Mould, MD;  Location: [facility] CATH LAB;  Service: Cardiovascular;  Laterality: N/A;  . Angioplasty Left 02/12/2015    Procedure: ANGIOPLASTY;  Surgeon: Angelia Mould, MD;  Location: [facility] CATH LAB;  Service: Cardiovascular;  Laterality: Left;  AVF  . Av fistula placement Left 03/20/2015    Procedure:  Inserton of Left Upper Arm Gortex Graft;  Surgeon: Angelia Mould, MD;  Location: [place];  Service: Vascular;  Laterality: Left;  . Ligation of arteriovenous  fistula Left 03/20/2015    Procedure: LIGATION OF ARTERIOVENOUS  FISTULA;  Surgeon: Angelia Mould, MD;  Location: [place];  Service: Vascular;  Laterality: Left;  . Av fistula placement Left 04/12/2015    Procedure: INSERTION OF LEFT ARM  ARTERIOVENOUS GORE-TEX GRAFT ;  Surgeon: Angelia Mould, MD;  Location: [place];  Service: Vascular;  Laterality: Left;  . Removal of graft Left 04/12/2015    Procedure: REMOVAL OF LEFT ARM ARTERIOVENOUS GORE-TEX GRAFT;  Surgeon: Angelia Mould, MD;  Location: [place];  Service: Vascular;  Laterality: Left;  Marland Kitchen Eye surgery     Social History:   reports that she has never smoked. She has never used smokeless tobacco. She reports that she does not drink alcohol or use illicit drugs.  Family History  Problem Relation Age of Onset  . Cancer Mother   . Diabetes Mother   . Heart disease Mother   . Heart attack Father   . Stroke Father   . Parkinson's disease Brother   . Cancer Sister     Medications: Patient's Medications  New Prescriptions   No medications on file  Previous Medications   ACETAMINOPHEN (TYLENOL) 325 MG TABLET    Take 650 mg by mouth every 4 (four) hours as needed for headache (muscle pain).    ALLOPURINOL (ZYLOPRIM) 100 MG TABLET    Take 100 mg by  mouth daily.   AMIODARONE (PACERONE) 200 MG TABLET    Take 200 mg by mouth daily.    ASPIRIN 325 MG EC TABLET    Take 325 mg by mouth daily.   ATORVASTATIN (LIPITOR) 80 MG TABLET    Take 1 tablet (80 mg total) by mouth daily at 6 PM.   B COMPLEX-C-FOLIC ACID (NEPHRO-VITE PO)    Take 1 tablet by mouth at bedtime.    DIAZEPAM (VALIUM) 5 MG TABLET    Take 5 mg by mouth at bedtime.   DICYCLOMINE (BENTYL) 20 MG TABLET    Take 1 tablet (20 mg total) by mouth 3 (three) times daily.   DORZOLAMIDE-TIMOLOL (COSOPT) 22.3-6.8 MG/ML OPHTHALMIC SOLUTION    Place 1 drop into the  left eye 2 (two) times daily. Wait 2-5 minutes when giving two eye meds   FOLIC ACID (FOLVITE) 1 MG TABLET    Take 1 mg by mouth daily.   LOPERAMIDE (IMODIUM A-D) 2 MG TABLET    Take 2 mg by mouth 3 (three) times daily as needed for diarrhea or loose stools. May take one dose a day and refuse the other two if not needed.   LORATADINE (CLARITIN) 10 MG TABLET    Take 10 mg by mouth daily as needed for allergies.    MECLIZINE (ANTIVERT) 12.5 MG TABLET    Take 12.5 mg by mouth 2 (two) times daily as needed for dizziness.    MELATONIN 5 MG TABS    Take 5 mg by mouth at bedtime.   METOPROLOL (LOPRESSOR) 50 MG TABLET    Take 50 mg by mouth 2 (two) times daily. Hold for blood pressure <110 and heart rate <60. Hold on dialysis days.   MIDODRINE (PROAMATINE) 10 MG TABLET    Take 10 mg by mouth every Monday, Wednesday, and Friday. On dialysis days   NUTRITIONAL SUPPLEMENTS (NUTRITIONAL DRINK MIX PO)    Take 1 each by mouth 3 (three) times daily. Mighty shake TID with MedPass (10P, 2P, 8P)   ONDANSETRON (ZOFRAN-ODT) 4 MG DISINTEGRATING TABLET    Take 4 mg by mouth every 6 (six) hours as needed for nausea or vomiting.    OXYCODONE (OXY IR/ROXICODONE) 5 MG IMMEDIATE RELEASE TABLET    Take 1 tablet (5 mg total) by mouth every 4 (four) hours as needed for severe pain.   OXYGEN    Inhale 2 L/min into the lungs continuous.    PANTOPRAZOLE (PROTONIX) 40 MG  TABLET    Take 40 mg by mouth 2 (two) times daily.    SKIN PROTECTANTS, MISC. (DIMETHICONE-ZINC OXIDE) CREAM    Apply topically daily as needed for dry skin (Apply topically to buttock and sacrum).   UNABLE TO FIND    Med Name: Preparation H CRM Aloe.  Apply to external hemorrhoids BID PRN.  Modified Medications   No medications on file  Discontinued Medications   No medications on file     Physical Exam: Filed Vitals:   04/17/16 1337  BP: 122/74  Pulse: 56  Temp: 98.1 F (36.7 C)  TempSrc: Oral  Resp: 20  Height: 5\' 3"  (1.6 m)  Weight: 170 lb (77.111 kg)  SpO2: 99%   Body mass index is 30.12 kg/(m^2).   Wt Readings from Last 3 Encounters:  04/17/16 170 lb (77.111 kg)  03/14/16 166 lb (75.297 kg)  03/05/16 166 lb (75.297 kg)   BP Readings from Last 3 Encounters:  04/17/16 122/74  03/14/16 138/69  03/05/16 112/62    General- elderly obese female, in no acute distress Head- normocephalic, atraumatic Eyes- no pallor, no icterus, no discharge, normal conjunctiva, normal sclera Neck- no cervical lymphadenopathy Cardiovascular- normal s1,s2, no murmurs Respiratory- bilateral clear to auscultation, no wheeze, no rhonchi, no crackles, no use of accessory muscles Abdomen- bowel sounds present, soft, non tender Musculoskeletal- able to move all 4 extremities, poor lower extremity strength left > right, trace leg edema Neurological- no focal deficit, alert and oriented to person, place and time Skin- warm and dry Psychiatry- normal mood and affect    Labs reviewed: Basic Metabolic Panel:  Recent Labs  04/23/15 12/06/15 03/10/16  NA 142 143  143 143  K 3.5 3.9  3.9 5.0  BUN 26* 12  12 30*  CREATININE 4.7* 2.1*  2.1* 4.7*   Liver Function Tests:  Recent Labs  04/23/15 12/06/15 03/10/16  AST 28 33  33 44*  ALT 17 25  25  34  ALKPHOS 84 103  103 153*   No results for input(s): LIPASE, AMYLASE in the last 8760 hours. No results for input(s): AMMONIA in the  last 8760 hours. CBC:  Recent Labs  12/06/15 03/10/16  WBC 4.1  4.1 6.7  NEUTROABS 1  1 3   HGB 9.5*  9.5* 10.9*  HCT 32*  32* 36  PLT 125*  125* 156    Lab Results  Component Value Date   HGBA1C 4.5 03/10/2016   Lab Results  Component Value Date   TSH 0.534 12/03/2014     Assessment/Plan  CHF Appears euvolemic. On o2, breathing stable. Currently not on any bronchodilator treatment. No recent exacerbation. Monitor clinically. Continue lopressor with dose adjustment as below. Wean off o2 as tolerated  HTN Reviewed bp reading, few low bp reading. Currently on metoprolol 50 mg bid, change this to 50 mg in am and 25 mg in pm and monitor  afib Rate controlled. Continue amiodarone 200 mg daily and metoprolol as above. Continue aspirin for now  HLD Lipid Panel     Component Value Date/Time   CHOL 77 12/06/2015   CHOL 77 12/06/2015   TRIG 103 12/06/2015   TRIG 103 12/06/2015   HDL 30* 12/06/2015   HDL 30* 12/06/2015   LDLCALC 26 12/06/2015   LDLCALC 26 12/06/2015   LDL at goal. Continue lipitor 80 mg daily, check lipid panel in June 2017   Dm type 2 with neuropathy a1c of 4.5 off all hypoglycemic agent for now. Monitor clinically. Patient mentions having numbness to her feet but denies pain. Change oxyIR to 5 mg q8h prn for now. Consider gabapentin if has burning or pain to her feet  gerd Stable, currently on protonix 40 mg bid, dose adjusted by GI during January visit. monitor  Diffuse osteopenia Noted on imaging. With her ESRD she will have calcitriol deficiency. Defer calcitriol supplementation to her nephrologist.  Hypotension with dialysis Stable, continue midodrine on dialysis days      [name], MD Internal Medicine [facility] [place],  [address] Cell Phone (Monday-Friday 8 am - 5 pm): [phone] On Call: [phone] and follow prompts after 5 pm and on weekends Office Phone:  [phone] Office Fax: [phone]

## 2016-04-18 DIAGNOSIS — Z23 Encounter for immunization: Secondary | ICD-10-CM | POA: Diagnosis not present

## 2016-04-18 DIAGNOSIS — E1122 Type 2 diabetes mellitus with diabetic chronic kidney disease: Secondary | ICD-10-CM | POA: Diagnosis not present

## 2016-04-18 DIAGNOSIS — N186 End stage renal disease: Secondary | ICD-10-CM | POA: Diagnosis not present

## 2016-04-18 DIAGNOSIS — D509 Iron deficiency anemia, unspecified: Secondary | ICD-10-CM | POA: Diagnosis not present

## 2016-04-20 DIAGNOSIS — N186 End stage renal disease: Secondary | ICD-10-CM | POA: Diagnosis not present

## 2016-04-20 DIAGNOSIS — Z992 Dependence on renal dialysis: Secondary | ICD-10-CM | POA: Diagnosis not present

## 2016-04-20 DIAGNOSIS — E1122 Type 2 diabetes mellitus with diabetic chronic kidney disease: Secondary | ICD-10-CM | POA: Diagnosis not present

## 2016-04-21 DIAGNOSIS — E1122 Type 2 diabetes mellitus with diabetic chronic kidney disease: Secondary | ICD-10-CM | POA: Diagnosis not present

## 2016-04-21 DIAGNOSIS — E875 Hyperkalemia: Secondary | ICD-10-CM | POA: Diagnosis not present

## 2016-04-21 DIAGNOSIS — D509 Iron deficiency anemia, unspecified: Secondary | ICD-10-CM | POA: Diagnosis not present

## 2016-04-21 DIAGNOSIS — D631 Anemia in chronic kidney disease: Secondary | ICD-10-CM | POA: Diagnosis not present

## 2016-04-21 DIAGNOSIS — N186 End stage renal disease: Secondary | ICD-10-CM | POA: Diagnosis not present

## 2016-04-21 DIAGNOSIS — N2581 Secondary hyperparathyroidism of renal origin: Secondary | ICD-10-CM | POA: Diagnosis not present

## 2016-04-23 DIAGNOSIS — D631 Anemia in chronic kidney disease: Secondary | ICD-10-CM | POA: Diagnosis not present

## 2016-04-23 DIAGNOSIS — D509 Iron deficiency anemia, unspecified: Secondary | ICD-10-CM | POA: Diagnosis not present

## 2016-04-23 DIAGNOSIS — E875 Hyperkalemia: Secondary | ICD-10-CM | POA: Diagnosis not present

## 2016-04-23 DIAGNOSIS — N186 End stage renal disease: Secondary | ICD-10-CM | POA: Diagnosis not present

## 2016-04-23 DIAGNOSIS — E1122 Type 2 diabetes mellitus with diabetic chronic kidney disease: Secondary | ICD-10-CM | POA: Diagnosis not present

## 2016-04-23 DIAGNOSIS — N2581 Secondary hyperparathyroidism of renal origin: Secondary | ICD-10-CM | POA: Diagnosis not present

## 2016-04-24 DIAGNOSIS — Z992 Dependence on renal dialysis: Secondary | ICD-10-CM | POA: Diagnosis not present

## 2016-04-24 DIAGNOSIS — T82858D Stenosis of vascular prosthetic devices, implants and grafts, subsequent encounter: Secondary | ICD-10-CM | POA: Diagnosis not present

## 2016-04-24 DIAGNOSIS — N186 End stage renal disease: Secondary | ICD-10-CM | POA: Diagnosis not present

## 2016-04-24 DIAGNOSIS — I871 Compression of vein: Secondary | ICD-10-CM | POA: Diagnosis not present

## 2016-04-25 DIAGNOSIS — D509 Iron deficiency anemia, unspecified: Secondary | ICD-10-CM | POA: Diagnosis not present

## 2016-04-25 DIAGNOSIS — D631 Anemia in chronic kidney disease: Secondary | ICD-10-CM | POA: Diagnosis not present

## 2016-04-25 DIAGNOSIS — N186 End stage renal disease: Secondary | ICD-10-CM | POA: Diagnosis not present

## 2016-04-25 DIAGNOSIS — E1122 Type 2 diabetes mellitus with diabetic chronic kidney disease: Secondary | ICD-10-CM | POA: Diagnosis not present

## 2016-04-25 DIAGNOSIS — N2581 Secondary hyperparathyroidism of renal origin: Secondary | ICD-10-CM | POA: Diagnosis not present

## 2016-04-25 DIAGNOSIS — E875 Hyperkalemia: Secondary | ICD-10-CM | POA: Diagnosis not present

## 2016-04-28 DIAGNOSIS — D631 Anemia in chronic kidney disease: Secondary | ICD-10-CM | POA: Diagnosis not present

## 2016-04-28 DIAGNOSIS — N186 End stage renal disease: Secondary | ICD-10-CM | POA: Diagnosis not present

## 2016-04-28 DIAGNOSIS — E1122 Type 2 diabetes mellitus with diabetic chronic kidney disease: Secondary | ICD-10-CM | POA: Diagnosis not present

## 2016-04-28 DIAGNOSIS — D509 Iron deficiency anemia, unspecified: Secondary | ICD-10-CM | POA: Diagnosis not present

## 2016-04-28 DIAGNOSIS — N2581 Secondary hyperparathyroidism of renal origin: Secondary | ICD-10-CM | POA: Diagnosis not present

## 2016-04-28 DIAGNOSIS — E875 Hyperkalemia: Secondary | ICD-10-CM | POA: Diagnosis not present

## 2016-04-30 DIAGNOSIS — D509 Iron deficiency anemia, unspecified: Secondary | ICD-10-CM | POA: Diagnosis not present

## 2016-04-30 DIAGNOSIS — E1122 Type 2 diabetes mellitus with diabetic chronic kidney disease: Secondary | ICD-10-CM | POA: Diagnosis not present

## 2016-04-30 DIAGNOSIS — N186 End stage renal disease: Secondary | ICD-10-CM | POA: Diagnosis not present

## 2016-04-30 DIAGNOSIS — N2581 Secondary hyperparathyroidism of renal origin: Secondary | ICD-10-CM | POA: Diagnosis not present

## 2016-04-30 DIAGNOSIS — D631 Anemia in chronic kidney disease: Secondary | ICD-10-CM | POA: Diagnosis not present

## 2016-04-30 DIAGNOSIS — E875 Hyperkalemia: Secondary | ICD-10-CM | POA: Diagnosis not present

## 2016-05-02 DIAGNOSIS — D631 Anemia in chronic kidney disease: Secondary | ICD-10-CM | POA: Diagnosis not present

## 2016-05-02 DIAGNOSIS — N186 End stage renal disease: Secondary | ICD-10-CM | POA: Diagnosis not present

## 2016-05-02 DIAGNOSIS — N2581 Secondary hyperparathyroidism of renal origin: Secondary | ICD-10-CM | POA: Diagnosis not present

## 2016-05-02 DIAGNOSIS — E875 Hyperkalemia: Secondary | ICD-10-CM | POA: Diagnosis not present

## 2016-05-02 DIAGNOSIS — D509 Iron deficiency anemia, unspecified: Secondary | ICD-10-CM | POA: Diagnosis not present

## 2016-05-02 DIAGNOSIS — E1122 Type 2 diabetes mellitus with diabetic chronic kidney disease: Secondary | ICD-10-CM | POA: Diagnosis not present

## 2016-05-06 DIAGNOSIS — Z79899 Other long term (current) drug therapy: Secondary | ICD-10-CM | POA: Diagnosis not present

## 2016-05-06 LAB — TSH: TSH: 0.88 u[IU]/mL (ref 0.41–5.90)

## 2016-05-07 DIAGNOSIS — N2581 Secondary hyperparathyroidism of renal origin: Secondary | ICD-10-CM | POA: Diagnosis not present

## 2016-05-07 DIAGNOSIS — N186 End stage renal disease: Secondary | ICD-10-CM | POA: Diagnosis not present

## 2016-05-07 DIAGNOSIS — E1122 Type 2 diabetes mellitus with diabetic chronic kidney disease: Secondary | ICD-10-CM | POA: Diagnosis not present

## 2016-05-07 DIAGNOSIS — E875 Hyperkalemia: Secondary | ICD-10-CM | POA: Diagnosis not present

## 2016-05-07 DIAGNOSIS — D631 Anemia in chronic kidney disease: Secondary | ICD-10-CM | POA: Diagnosis not present

## 2016-05-07 DIAGNOSIS — D509 Iron deficiency anemia, unspecified: Secondary | ICD-10-CM | POA: Diagnosis not present

## 2016-05-09 DIAGNOSIS — N2581 Secondary hyperparathyroidism of renal origin: Secondary | ICD-10-CM | POA: Diagnosis not present

## 2016-05-09 DIAGNOSIS — D631 Anemia in chronic kidney disease: Secondary | ICD-10-CM | POA: Diagnosis not present

## 2016-05-09 DIAGNOSIS — E875 Hyperkalemia: Secondary | ICD-10-CM | POA: Diagnosis not present

## 2016-05-09 DIAGNOSIS — D509 Iron deficiency anemia, unspecified: Secondary | ICD-10-CM | POA: Diagnosis not present

## 2016-05-09 DIAGNOSIS — E1122 Type 2 diabetes mellitus with diabetic chronic kidney disease: Secondary | ICD-10-CM | POA: Diagnosis not present

## 2016-05-09 DIAGNOSIS — N186 End stage renal disease: Secondary | ICD-10-CM | POA: Diagnosis not present

## 2016-05-10 DIAGNOSIS — N2581 Secondary hyperparathyroidism of renal origin: Secondary | ICD-10-CM | POA: Diagnosis not present

## 2016-05-10 DIAGNOSIS — E875 Hyperkalemia: Secondary | ICD-10-CM | POA: Diagnosis not present

## 2016-05-10 DIAGNOSIS — D631 Anemia in chronic kidney disease: Secondary | ICD-10-CM | POA: Diagnosis not present

## 2016-05-10 DIAGNOSIS — N186 End stage renal disease: Secondary | ICD-10-CM | POA: Diagnosis not present

## 2016-05-10 DIAGNOSIS — D509 Iron deficiency anemia, unspecified: Secondary | ICD-10-CM | POA: Diagnosis not present

## 2016-05-10 DIAGNOSIS — E1122 Type 2 diabetes mellitus with diabetic chronic kidney disease: Secondary | ICD-10-CM | POA: Diagnosis not present

## 2016-05-12 DIAGNOSIS — N2581 Secondary hyperparathyroidism of renal origin: Secondary | ICD-10-CM | POA: Diagnosis not present

## 2016-05-12 DIAGNOSIS — D631 Anemia in chronic kidney disease: Secondary | ICD-10-CM | POA: Diagnosis not present

## 2016-05-12 DIAGNOSIS — E1122 Type 2 diabetes mellitus with diabetic chronic kidney disease: Secondary | ICD-10-CM | POA: Diagnosis not present

## 2016-05-12 DIAGNOSIS — E875 Hyperkalemia: Secondary | ICD-10-CM | POA: Diagnosis not present

## 2016-05-12 DIAGNOSIS — D509 Iron deficiency anemia, unspecified: Secondary | ICD-10-CM | POA: Diagnosis not present

## 2016-05-12 DIAGNOSIS — N186 End stage renal disease: Secondary | ICD-10-CM | POA: Diagnosis not present

## 2016-05-13 ENCOUNTER — Encounter: Payer: Self-pay | Admitting: Adult Health

## 2016-05-13 ENCOUNTER — Non-Acute Institutional Stay (SKILLED_NURSING_FACILITY): Payer: Medicare Other | Admitting: Adult Health

## 2016-05-13 DIAGNOSIS — N186 End stage renal disease: Secondary | ICD-10-CM | POA: Diagnosis not present

## 2016-05-13 DIAGNOSIS — G4700 Insomnia, unspecified: Secondary | ICD-10-CM | POA: Diagnosis not present

## 2016-05-13 DIAGNOSIS — K2970 Gastritis, unspecified, without bleeding: Secondary | ICD-10-CM | POA: Diagnosis not present

## 2016-05-13 DIAGNOSIS — F419 Anxiety disorder, unspecified: Secondary | ICD-10-CM

## 2016-05-13 DIAGNOSIS — Z992 Dependence on renal dialysis: Secondary | ICD-10-CM | POA: Diagnosis not present

## 2016-05-13 DIAGNOSIS — I953 Hypotension of hemodialysis: Secondary | ICD-10-CM | POA: Diagnosis not present

## 2016-05-13 DIAGNOSIS — K2990 Gastroduodenitis, unspecified, without bleeding: Secondary | ICD-10-CM

## 2016-05-13 DIAGNOSIS — I480 Paroxysmal atrial fibrillation: Secondary | ICD-10-CM | POA: Diagnosis not present

## 2016-05-13 DIAGNOSIS — E1122 Type 2 diabetes mellitus with diabetic chronic kidney disease: Secondary | ICD-10-CM | POA: Diagnosis not present

## 2016-05-13 DIAGNOSIS — J309 Allergic rhinitis, unspecified: Secondary | ICD-10-CM

## 2016-05-13 DIAGNOSIS — H8110 Benign paroxysmal vertigo, unspecified ear: Secondary | ICD-10-CM

## 2016-05-13 DIAGNOSIS — E785 Hyperlipidemia, unspecified: Secondary | ICD-10-CM | POA: Diagnosis not present

## 2016-05-13 DIAGNOSIS — N182 Chronic kidney disease, stage 2 (mild): Secondary | ICD-10-CM

## 2016-05-13 NOTE — Progress Notes (Signed)
Patient ID: [name] James, female   DOB: [birth year], 80 y.o.   MRN: [id number]   DATE:     05/13/16  Facility:  Nursing Home Location:  [place] Room Number: 205-2 LEVEL OF CARE:  SNF (31)   Chief Complaint  Patient presents with  . Medical Management of Chronic Issues    HISTORY OF PRESENT ILLNESS:  This is a 80 year old female who is being seen for a routine visit. She is a long-term care resident of [facility]. She has recently requested her CBGs to be checked twice a day. Her diabetes is diet-controlled. Latest hgbA1c is 4.5. Her hypertension is well-controlled - 114/68, 133/65, 141/90, 116/54. She has ESRD and continues to do hemodialysis 3X/week.  PAST MEDICAL HISTORY:  Past Medical History  Diagnosis Date  . CHF (congestive heart failure) ([place]) 05/13/2011    echo - [id number]; stage 1 diastolic dysfunction; elevated LV filling pressure, MAC  . Chest pain, atypical 12/05/2008    R/Lmv- normal perfusion all regions, noe ECG changes   . Claudication ([place]) 10/30/2005    doppler - normal evaluation, no evidence of aneurysm, diameter reduction, dissection, compression or vascular abnormality)  . Type 2 diabetes mellitus with diabetic neuropathy, with long-term current use of insulin ([place])   . Peripheral neuropathy ([place])   . Hypertension   . Edema   . Gout   . Anxiety   . Cataract   . GERD (gastroesophageal reflux disease)   . Complication of anesthesia   . PONV (postoperative nausea and vomiting)   . Family history of adverse reaction to anesthesia     son also has nausea   . Dyslipidemia   . Anemia   . Morbid obesity ([place])   . Atrial fibrillation ([place])   . Barrett esophagus   . Renal insufficiency 10/30/2005    doppler - abn resistance consistent w/ parenchymal disease  . CKD (chronic kidney disease), stage III   . Renal cyst   . Arthritis     knees  . Breast cancer ([place]) 1980s    bil mastectomies, no radiation or chemo  . Dysrhythmia      Atrial Fibrillation  . Renovascular hypertension   . ESRD (end stage renal disease) ([place])   . Gastritis and gastroduodenitis   . Insomnia   . Vertigo   . IBS (irritable bowel syndrome)   . Anemia, chronic disease   . HLD (hyperlipidemia)   . Hemodialysis-associated hypotension     CURRENT MEDICATIONS: Reviewed per MAR/see medication list   Medication List       This list is accurate as of: 05/13/16  9:46 PM.  Always use your most recent med list.               acetaminophen 325 MG tablet  Commonly known as:  TYLENOL  Take 650 mg by mouth every 4 (four) hours as needed for headache (muscle pain).     allopurinol 100 MG tablet  Commonly known as:  ZYLOPRIM  Take 100 mg by mouth daily.     amiodarone 200 MG tablet  Commonly known as:  PACERONE  Take 200 mg by mouth daily.     aspirin 325 MG EC tablet  Take 325 mg by mouth daily.     atorvastatin 80 MG tablet  Commonly known as:  LIPITOR  Take 1 tablet (80 mg total) by mouth daily at 6 PM.     diazepam 5 MG tablet  Commonly known  as:  VALIUM  Take 5 mg by mouth at bedtime.     dicyclomine 20 MG tablet  Commonly known as:  BENTYL  Take 1 tablet (20 mg total) by mouth 3 (three) times daily.     dimethicone-zinc oxide cream  Apply topically daily as needed for dry skin (Apply topically to buttock and sacrum).     dorzolamide-timolol 22.3-6.8 MG/ML ophthalmic solution  Commonly known as:  COSOPT  Place 1 drop into the left eye 2 (two) times daily. Wait 2-5 minutes when giving two eye meds     folic acid 1 MG tablet  Commonly known as:  FOLVITE  Take 1 mg by mouth daily.     loperamide 2 MG tablet  Commonly known as:  IMODIUM A-D  Take 2 mg by mouth 3 (three) times daily as needed for diarrhea or loose stools. May take one dose a day and refuse the other two if not needed.     loratadine 10 MG tablet  Commonly known as:  CLARITIN  Take 10 mg by mouth daily as needed for allergies.     meclizine 12.5 MG  tablet  Commonly known as:  ANTIVERT  Take 12.5 mg by mouth 2 (two) times daily as needed for dizziness.     Melatonin 5 MG Tabs  Take 5 mg by mouth at bedtime.     metoprolol 50 MG tablet  Commonly known as:  LOPRESSOR  Take 25-50 mg by mouth 2 (two) times daily. Take 50 mg in the a.m. And 25 mg in the p.m. Hold for blood pressure <110 and heart rate <60. Hold on dialysis days.     midodrine 10 MG tablet  Commonly known as:  PROAMATINE  Take 10 mg by mouth every Monday, Wednesday, and Friday. On dialysis days     NEPHRO-VITE PO  Take 1 tablet by mouth at bedtime.     NUTRITIONAL DRINK MIX PO  Take 1 each by mouth 3 (three) times daily. Mighty shake TID with MedPass (10P, 2P, 8P)     ondansetron 4 MG disintegrating tablet  Commonly known as:  ZOFRAN-ODT  Take 4 mg by mouth every 6 (six) hours as needed for nausea or vomiting.     oxycodone 5 MG capsule  Commonly known as:  OXY-IR  Take 5 mg by mouth every 8 (eight) hours.     OXYGEN  Inhale 2 L/min into the lungs continuous.     pantoprazole 40 MG tablet  Commonly known as:  PROTONIX  Take 40 mg by mouth 2 (two) times daily.     UNABLE TO FIND  Med Name: Preparation H CRM Aloe.  Apply to external hemorrhoids BID PRN.         Allergies  Allergen Reactions  . Erythromycin Swelling  . Erythromycin Base     Other reaction(s): GI Upset (intolerance)  . Penicillins Other (See Comments)    Unknown allergic reaction.  She has taken keflex many times without problems  . Codeine Rash and Palpitations    Other reaction(s): GI Upset (intolerance)  . Penicillin G Rash   REVIEW OF SYSTEMS:  GENERAL: no fatigue, no weight changes, no fever, chills  RESPIRATORY: no cough, SOB, DOE, wheezing, hemoptysis CARDIAC: no chest pain GI: no abdominal pain, diarrhea, constipation, heart burn, nausea or vomiting  PHYSICAL EXAMINATION  GENERAL: no acute distress, obese SKIN:  Skin is warm and dry. NECK: supple, trachea midline,  no neck masses, no thyroid tenderness, no thyromegaly LYMPHATICS:  no LAN in the neck, no supraclavicular LAN RESPIRATORY: breathing is even & unlabored, BS CTAB CARDIAC: RRR, no murmur,no extra heart sounds, BLE edema 1+ GI: abdomen soft, normal BS, no masses, no tenderness, no hepatomegaly, no splenomegaly EXTREMITIES: Able to move all 4 extremities; Left wrist AV fistula + for bruit and thrill PSYCHIATRIC: the patient is alert & oriented to person, affect & behavior appropriate  LABS/RADIOLOGY: Labs reviewed: Basic Metabolic Panel:  Recent Labs  12/06/15 03/10/16  NA 143  143 143  K 3.9  3.9 5.0  BUN 12  12 30*  CREATININE 2.1*  2.1* 4.7*   Liver Function Tests:  Recent Labs  12/06/15 03/10/16  AST 33  33 44*  ALT 25  25 34  ALKPHOS 103  103 153*    CBC:  Recent Labs  12/06/15 03/10/16  WBC 4.1  4.1 6.7  NEUTROABS 1  1 3   HGB 9.5*  9.5* 10.9*  HCT 32*  32* 36  PLT 125*  125* 156     ASSESSMENT/PLAN:  Diabetes mellitus with renal complication - hemoglobin A1c 4.5; diet-controlled; requested CBGs to be done BID  Paroxysmal atrial fibrillation - rate controlled; continue amiodarone 200 mg 1 tab by mouth daily and metoprolol tartrate 50 mg 1 tab by mouth every morning and p.m. and aspirin 325 mg 1 tab by mouth daily  Allergic rhinitis - continue Claritin 10 mg 1 tab by mouth daily when necessary  Vertigo - continue meclizine 12.5 mg 1 tab by mouth twice a day when necessary  Hemodialysis associated hypotension - continue Midodrin 10 mg 1 tab by mouth every Mondays-Wednesdays-Fridays  Gout - continue allopurinol 100 mg 1 tab by mouth daily  Hyperlipidemia - continue Lipitor 80 mg 1 tab by mouth daily Lab Results  Component Value Date   CHOL 77 12/06/2015   CHOL 77 12/06/2015   HDL 30* 12/06/2015   HDL 30* 12/06/2015   LDLCALC 26 12/06/2015   LDLCALC 26 12/06/2015   TRIG 103 12/06/2015   TRIG 103 12/06/2015    Insomnia - continue melatonin 5  mg 1 tab by mouth daily at bedtime  Anxiety - continue volume 5 mg 1 tab by mouth daily at bedtime  Gastritis - continue Protonix 40 mg 1 tab by mouth twice a day     Goals of care:  Long-term care    Durenda Age, NP [facility] [phone]

## 2016-05-14 DIAGNOSIS — E875 Hyperkalemia: Secondary | ICD-10-CM | POA: Diagnosis not present

## 2016-05-14 DIAGNOSIS — N186 End stage renal disease: Secondary | ICD-10-CM | POA: Diagnosis not present

## 2016-05-14 DIAGNOSIS — N2581 Secondary hyperparathyroidism of renal origin: Secondary | ICD-10-CM | POA: Diagnosis not present

## 2016-05-14 DIAGNOSIS — D631 Anemia in chronic kidney disease: Secondary | ICD-10-CM | POA: Diagnosis not present

## 2016-05-14 DIAGNOSIS — E1122 Type 2 diabetes mellitus with diabetic chronic kidney disease: Secondary | ICD-10-CM | POA: Diagnosis not present

## 2016-05-14 DIAGNOSIS — D509 Iron deficiency anemia, unspecified: Secondary | ICD-10-CM | POA: Diagnosis not present

## 2016-05-16 DIAGNOSIS — D509 Iron deficiency anemia, unspecified: Secondary | ICD-10-CM | POA: Diagnosis not present

## 2016-05-16 DIAGNOSIS — E1122 Type 2 diabetes mellitus with diabetic chronic kidney disease: Secondary | ICD-10-CM | POA: Diagnosis not present

## 2016-05-16 DIAGNOSIS — N186 End stage renal disease: Secondary | ICD-10-CM | POA: Diagnosis not present

## 2016-05-16 DIAGNOSIS — N2581 Secondary hyperparathyroidism of renal origin: Secondary | ICD-10-CM | POA: Diagnosis not present

## 2016-05-16 DIAGNOSIS — E875 Hyperkalemia: Secondary | ICD-10-CM | POA: Diagnosis not present

## 2016-05-16 DIAGNOSIS — D631 Anemia in chronic kidney disease: Secondary | ICD-10-CM | POA: Diagnosis not present

## 2016-05-19 DIAGNOSIS — N2581 Secondary hyperparathyroidism of renal origin: Secondary | ICD-10-CM | POA: Diagnosis not present

## 2016-05-19 DIAGNOSIS — N186 End stage renal disease: Secondary | ICD-10-CM | POA: Diagnosis not present

## 2016-05-19 DIAGNOSIS — D631 Anemia in chronic kidney disease: Secondary | ICD-10-CM | POA: Diagnosis not present

## 2016-05-19 DIAGNOSIS — E875 Hyperkalemia: Secondary | ICD-10-CM | POA: Diagnosis not present

## 2016-05-19 DIAGNOSIS — E1122 Type 2 diabetes mellitus with diabetic chronic kidney disease: Secondary | ICD-10-CM | POA: Diagnosis not present

## 2016-05-19 DIAGNOSIS — D509 Iron deficiency anemia, unspecified: Secondary | ICD-10-CM | POA: Diagnosis not present

## 2016-05-21 DIAGNOSIS — N2581 Secondary hyperparathyroidism of renal origin: Secondary | ICD-10-CM | POA: Diagnosis not present

## 2016-05-21 DIAGNOSIS — N186 End stage renal disease: Secondary | ICD-10-CM | POA: Diagnosis not present

## 2016-05-21 DIAGNOSIS — D631 Anemia in chronic kidney disease: Secondary | ICD-10-CM | POA: Diagnosis not present

## 2016-05-21 DIAGNOSIS — D509 Iron deficiency anemia, unspecified: Secondary | ICD-10-CM | POA: Diagnosis not present

## 2016-05-21 DIAGNOSIS — Z992 Dependence on renal dialysis: Secondary | ICD-10-CM | POA: Diagnosis not present

## 2016-05-21 DIAGNOSIS — E1122 Type 2 diabetes mellitus with diabetic chronic kidney disease: Secondary | ICD-10-CM | POA: Diagnosis not present

## 2016-05-21 DIAGNOSIS — E875 Hyperkalemia: Secondary | ICD-10-CM | POA: Diagnosis not present

## 2016-05-22 DIAGNOSIS — Z79899 Other long term (current) drug therapy: Secondary | ICD-10-CM | POA: Diagnosis not present

## 2016-05-22 LAB — LIPID PANEL
CHOLESTEROL: 92 mg/dL (ref 0–200)
HDL: 35 mg/dL (ref 35–70)
LDL Cholesterol: 29 mg/dL
TRIGLYCERIDES: 142 mg/dL (ref 40–160)

## 2016-05-22 LAB — HEMOGLOBIN A1C: Hemoglobin A1C: 5.1

## 2016-05-23 DIAGNOSIS — D509 Iron deficiency anemia, unspecified: Secondary | ICD-10-CM | POA: Diagnosis not present

## 2016-05-23 DIAGNOSIS — N186 End stage renal disease: Secondary | ICD-10-CM | POA: Diagnosis not present

## 2016-05-23 DIAGNOSIS — D631 Anemia in chronic kidney disease: Secondary | ICD-10-CM | POA: Diagnosis not present

## 2016-05-23 DIAGNOSIS — E1122 Type 2 diabetes mellitus with diabetic chronic kidney disease: Secondary | ICD-10-CM | POA: Diagnosis not present

## 2016-05-26 DIAGNOSIS — D631 Anemia in chronic kidney disease: Secondary | ICD-10-CM | POA: Diagnosis not present

## 2016-05-26 DIAGNOSIS — N186 End stage renal disease: Secondary | ICD-10-CM | POA: Diagnosis not present

## 2016-05-26 DIAGNOSIS — E1122 Type 2 diabetes mellitus with diabetic chronic kidney disease: Secondary | ICD-10-CM | POA: Diagnosis not present

## 2016-05-26 DIAGNOSIS — D509 Iron deficiency anemia, unspecified: Secondary | ICD-10-CM | POA: Diagnosis not present

## 2016-05-28 DIAGNOSIS — D509 Iron deficiency anemia, unspecified: Secondary | ICD-10-CM | POA: Diagnosis not present

## 2016-05-28 DIAGNOSIS — N186 End stage renal disease: Secondary | ICD-10-CM | POA: Diagnosis not present

## 2016-05-28 DIAGNOSIS — D631 Anemia in chronic kidney disease: Secondary | ICD-10-CM | POA: Diagnosis not present

## 2016-05-28 DIAGNOSIS — E1122 Type 2 diabetes mellitus with diabetic chronic kidney disease: Secondary | ICD-10-CM | POA: Diagnosis not present

## 2016-05-30 DIAGNOSIS — N186 End stage renal disease: Secondary | ICD-10-CM | POA: Diagnosis not present

## 2016-05-30 DIAGNOSIS — D509 Iron deficiency anemia, unspecified: Secondary | ICD-10-CM | POA: Diagnosis not present

## 2016-05-30 DIAGNOSIS — E1122 Type 2 diabetes mellitus with diabetic chronic kidney disease: Secondary | ICD-10-CM | POA: Diagnosis not present

## 2016-05-30 DIAGNOSIS — D631 Anemia in chronic kidney disease: Secondary | ICD-10-CM | POA: Diagnosis not present

## 2016-06-02 DIAGNOSIS — N186 End stage renal disease: Secondary | ICD-10-CM | POA: Diagnosis not present

## 2016-06-02 DIAGNOSIS — D631 Anemia in chronic kidney disease: Secondary | ICD-10-CM | POA: Diagnosis not present

## 2016-06-02 DIAGNOSIS — E1122 Type 2 diabetes mellitus with diabetic chronic kidney disease: Secondary | ICD-10-CM | POA: Diagnosis not present

## 2016-06-02 DIAGNOSIS — D509 Iron deficiency anemia, unspecified: Secondary | ICD-10-CM | POA: Diagnosis not present

## 2016-06-04 DIAGNOSIS — D509 Iron deficiency anemia, unspecified: Secondary | ICD-10-CM | POA: Diagnosis not present

## 2016-06-04 DIAGNOSIS — N186 End stage renal disease: Secondary | ICD-10-CM | POA: Diagnosis not present

## 2016-06-04 DIAGNOSIS — D631 Anemia in chronic kidney disease: Secondary | ICD-10-CM | POA: Diagnosis not present

## 2016-06-04 DIAGNOSIS — E1122 Type 2 diabetes mellitus with diabetic chronic kidney disease: Secondary | ICD-10-CM | POA: Diagnosis not present

## 2016-06-07 DIAGNOSIS — D631 Anemia in chronic kidney disease: Secondary | ICD-10-CM | POA: Diagnosis not present

## 2016-06-07 DIAGNOSIS — D509 Iron deficiency anemia, unspecified: Secondary | ICD-10-CM | POA: Diagnosis not present

## 2016-06-07 DIAGNOSIS — N186 End stage renal disease: Secondary | ICD-10-CM | POA: Diagnosis not present

## 2016-06-07 DIAGNOSIS — E1122 Type 2 diabetes mellitus with diabetic chronic kidney disease: Secondary | ICD-10-CM | POA: Diagnosis not present

## 2016-06-09 DIAGNOSIS — E1122 Type 2 diabetes mellitus with diabetic chronic kidney disease: Secondary | ICD-10-CM | POA: Diagnosis not present

## 2016-06-09 DIAGNOSIS — N186 End stage renal disease: Secondary | ICD-10-CM | POA: Diagnosis not present

## 2016-06-09 DIAGNOSIS — D631 Anemia in chronic kidney disease: Secondary | ICD-10-CM | POA: Diagnosis not present

## 2016-06-09 DIAGNOSIS — D509 Iron deficiency anemia, unspecified: Secondary | ICD-10-CM | POA: Diagnosis not present

## 2016-06-10 ENCOUNTER — Encounter: Payer: Self-pay | Admitting: Adult Health

## 2016-06-10 ENCOUNTER — Non-Acute Institutional Stay (SKILLED_NURSING_FACILITY): Payer: Medicare Other | Admitting: Adult Health

## 2016-06-10 DIAGNOSIS — E785 Hyperlipidemia, unspecified: Secondary | ICD-10-CM | POA: Diagnosis not present

## 2016-06-10 DIAGNOSIS — R29898 Other symptoms and signs involving the musculoskeletal system: Secondary | ICD-10-CM

## 2016-06-10 DIAGNOSIS — I480 Paroxysmal atrial fibrillation: Secondary | ICD-10-CM

## 2016-06-10 DIAGNOSIS — E1122 Type 2 diabetes mellitus with diabetic chronic kidney disease: Secondary | ICD-10-CM

## 2016-06-10 DIAGNOSIS — N182 Chronic kidney disease, stage 2 (mild): Secondary | ICD-10-CM

## 2016-06-10 DIAGNOSIS — N186 End stage renal disease: Secondary | ICD-10-CM

## 2016-06-10 DIAGNOSIS — K2970 Gastritis, unspecified, without bleeding: Secondary | ICD-10-CM

## 2016-06-10 DIAGNOSIS — M179 Osteoarthritis of knee, unspecified: Secondary | ICD-10-CM | POA: Diagnosis not present

## 2016-06-10 DIAGNOSIS — J309 Allergic rhinitis, unspecified: Secondary | ICD-10-CM | POA: Diagnosis not present

## 2016-06-10 DIAGNOSIS — M1000 Idiopathic gout, unspecified site: Secondary | ICD-10-CM

## 2016-06-10 DIAGNOSIS — I953 Hypotension of hemodialysis: Secondary | ICD-10-CM | POA: Diagnosis not present

## 2016-06-10 DIAGNOSIS — G4700 Insomnia, unspecified: Secondary | ICD-10-CM

## 2016-06-10 DIAGNOSIS — F419 Anxiety disorder, unspecified: Secondary | ICD-10-CM | POA: Diagnosis not present

## 2016-06-10 DIAGNOSIS — H8110 Benign paroxysmal vertigo, unspecified ear: Secondary | ICD-10-CM | POA: Diagnosis not present

## 2016-06-10 DIAGNOSIS — Z992 Dependence on renal dialysis: Secondary | ICD-10-CM

## 2016-06-10 DIAGNOSIS — M6281 Muscle weakness (generalized): Secondary | ICD-10-CM | POA: Diagnosis not present

## 2016-06-10 DIAGNOSIS — K2990 Gastroduodenitis, unspecified, without bleeding: Secondary | ICD-10-CM

## 2016-06-10 NOTE — Progress Notes (Signed)
Patient ID: [name] James, female   DOB: 08-07-1936, 80 y.o.   MRN: [id number]   DATE:     06/10/16  Facility:  Nursing Home Location:  [place] Room Number: 205-B LEVEL OF CARE:  SNF (31)   Chief Complaint  Patient presents with  . Medical Management of Chronic Issues    HISTORY OF PRESENT ILLNESS:  This is a 80 year old female who is being seen for a routine visit. She is a long-term care resident of [facility]. She is currently having PT for lower extremity weakness. Her HR is rate-controlled and continues to take Metoprolol, Amiodarone and ASA.  PAST MEDICAL HISTORY:  Past Medical History  Diagnosis Date  . CHF (congestive heart failure) ([place]) 05/13/2011    echo - [id number]; stage 1 diastolic dysfunction; elevated LV filling pressure, MAC  . Chest pain, atypical 12/05/2008    R/Lmv- normal perfusion all regions, noe ECG changes   . Claudication ([place]) 10/30/2005    doppler - normal evaluation, no evidence of aneurysm, diameter reduction, dissection, compression or vascular abnormality)  . Type 2 diabetes mellitus with diabetic neuropathy, with long-term current use of insulin ([place])   . Peripheral neuropathy ([place])   . Hypertension   . Edema   . Gout   . Anxiety   . Cataract   . GERD (gastroesophageal reflux disease)   . Complication of anesthesia   . PONV (postoperative nausea and vomiting)   . Family history of adverse reaction to anesthesia     son also has nausea   . Dyslipidemia   . Anemia   . Morbid obesity ([place])   . Atrial fibrillation ([place])   . Barrett esophagus   . Renal insufficiency 10/30/2005    doppler - abn resistance consistent w/ parenchymal disease  . CKD (chronic kidney disease), stage III   . Renal cyst   . Arthritis     knees  . Breast cancer ([place]) 1980s    bil mastectomies, no radiation or chemo  . Dysrhythmia     Atrial Fibrillation  . Renovascular hypertension   . ESRD (end stage renal disease) ([place])   .  Gastritis and gastroduodenitis   . Insomnia   . Vertigo   . IBS (irritable bowel syndrome)   . Anemia, chronic disease   . HLD (hyperlipidemia)   . Hemodialysis-associated hypotension   . PAF (paroxysmal atrial fibrillation) ([place])   . Benign paroxysmal positional vertigo     CURRENT MEDICATIONS: Reviewed per MAR/see medication list   Medication List       This list is accurate as of: 06/10/16 11:59 PM.  Always use your most recent med list.               acetaminophen 325 MG tablet  Commonly known as:  TYLENOL  Take 650 mg by mouth every 4 (four) hours as needed for headache (muscle pain).     allopurinol 100 MG tablet  Commonly known as:  ZYLOPRIM  Take 100 mg by mouth daily.     amiodarone 200 MG tablet  Commonly known as:  PACERONE  Take 200 mg by mouth daily.     aspirin 325 MG EC tablet  Take 325 mg by mouth daily.     atorvastatin 80 MG tablet  Commonly known as:  LIPITOR  Take 1 tablet (80 mg total) by mouth daily at 6 PM.     diazepam 5 MG tablet  Commonly known as:  VALIUM  Take 5 mg by mouth at bedtime.     dicyclomine 20 MG tablet  Commonly known as:  BENTYL  Take 1 tablet (20 mg total) by mouth 3 (three) times daily.     dorzolamide-timolol 22.3-6.8 MG/ML ophthalmic solution  Commonly known as:  COSOPT  Place 1 drop into the left eye 2 (two) times daily. Wait 2-5 minutes when giving two eye meds     folic acid 1 MG tablet  Commonly known as:  FOLVITE  Take 1 mg by mouth daily.     loperamide 2 MG tablet  Commonly known as:  IMODIUM A-D  Take 2 mg by mouth 3 (three) times daily as needed for diarrhea or loose stools. May take one dose a day and refuse the other two if not needed.     loratadine 10 MG tablet  Commonly known as:  CLARITIN  Take 10 mg by mouth daily as needed for allergies.     meclizine 12.5 MG tablet  Commonly known as:  ANTIVERT  Take 12.5 mg by mouth 2 (two) times daily as needed for dizziness.     Melatonin 5 MG Tabs   Take 5 mg by mouth at bedtime.     metoprolol 50 MG tablet  Commonly known as:  LOPRESSOR  Take 25-50 mg by mouth 2 (two) times daily. Take 50 mg in the a.m. And 25 mg in the p.m. Hold for blood pressure <110 and heart rate <60. Hold on dialysis days.     midodrine 10 MG tablet  Commonly known as:  PROAMATINE  Take 10 mg by mouth every Monday, Wednesday, and Friday. On dialysis days     NEPHRO-VITE PO  Take 1 tablet by mouth at bedtime.     NUTRITIONAL DRINK MIX PO  Take 1 each by mouth 3 (three) times daily. Mighty shake TID with MedPass (10P, 2P, 8P)     ondansetron 4 MG disintegrating tablet  Commonly known as:  ZOFRAN-ODT  Take 4 mg by mouth every 6 (six) hours as needed for nausea or vomiting.     oxycodone 5 MG capsule  Commonly known as:  OXY-IR  Take 5 mg by mouth every 8 (eight) hours.     OXYGEN  Inhale 2 L/min into the lungs continuous.     pantoprazole 40 MG tablet  Commonly known as:  PROTONIX  Take 40 mg by mouth 2 (two) times daily.     UNABLE TO FIND  Med Name: Preparation H CRM Aloe.  Apply to external hemorrhoids BID PRN.         Allergies  Allergen Reactions  . Erythromycin Swelling  . Erythromycin Base     Other reaction(s): GI Upset (intolerance)  . Penicillins Other (See Comments)    Unknown allergic reaction.  She has taken keflex many times without problems  . Codeine Rash and Palpitations    Other reaction(s): GI Upset (intolerance)  . Penicillin G Rash   REVIEW OF SYSTEMS:  GENERAL: no fatigue, no weight changes, no fever, chills  RESPIRATORY: no cough, SOB, DOE, wheezing, hemoptysis CARDIAC: no chest pain GI: no abdominal pain, diarrhea, constipation, heart burn, nausea or vomiting  PHYSICAL EXAMINATION  GENERAL: no acute distress, obese SKIN:  Skin is warm and dry. NECK: supple, trachea midline, no neck masses, no thyroid tenderness, no thyromegaly LYMPHATICS: no LAN in the neck, no supraclavicular LAN RESPIRATORY: breathing  is even & unlabored, BS CTAB CARDIAC: RRR, no murmur,no extra heart sounds, BLE edema 1+  GI: abdomen soft, normal BS, no masses, no tenderness, no hepatomegaly, no splenomegaly EXTREMITIES: Able to move all 4 extremities; Left upper arm AV fistula + for bruit and thrill PSYCHIATRIC: the patient is alert & oriented to person, affect & behavior appropriate  LABS/RADIOLOGY: Labs reviewed: Basic Metabolic Panel:  Recent Labs  12/06/15 03/10/16  NA 143  143 143  K 3.9  3.9 5.0  BUN 12  12 30*  CREATININE 2.1*  2.1* 4.7*   Liver Function Tests:  Recent Labs  12/06/15 03/10/16  AST 33  33 44*  ALT 25  25 34  ALKPHOS 103  103 153*    CBC:  Recent Labs  12/06/15 03/10/16  WBC 4.1  4.1 6.7  NEUTROABS 1  1 3   HGB 9.5*  9.5* 10.9*  HCT 32*  32* 36  PLT 125*  125* 156     ASSESSMENT/PLAN:   Bilateral lower extremity weakness - recently started physical therapy  Diabetes mellitus with renal complication -  diet-controlled; requested CBGs to be done BID Lab Results  Component Value Date   HGBA1C 5.1 05/22/2016    Paroxysmal atrial fibrillation - rate controlled; continue amiodarone 200 mg 1 tab by mouth daily and metoprolol tartrate 50 mg 1 tab by mouth every morning and p.m. and aspirin 325 mg 1 tab by mouth daily; check CBC Lab Results  Component Value Date   TSH 0.88 05/06/2016    Allergic rhinitis - continue Claritin 10 mg 1 tab by mouth daily when necessary  Vertigo - continue meclizine 12.5 mg 1 tab by mouth twice a day when necessary  Hemodialysis associated hypotension - continue Midodrin 10 mg 1 tab by mouth every Mondays-Wednesdays-Fridays  Gout - continue allopurinol 100 mg 1 tab by mouth daily  Hyperlipidemia - continue Lipitor 80 mg 1 tab by mouth daily; check CMP Lab Results  Component Value Date   CHOL 92 05/22/2016   HDL 35 05/22/2016   LDLCALC 29 05/22/2016   TRIG 142 05/22/2016    Insomnia - continue melatonin 5 mg 1 tab by mouth  daily at bedtime  Anxiety - continue volume 5 mg 1 tab by mouth daily at bedtime  Gastritis - continue Protonix 40 mg 1 tab by mouth twice a day  ESRD - on HD Q M-W-F   Goals of care:  Long-term care    Durenda Age, NP [place] [phone]

## 2016-06-11 DIAGNOSIS — D509 Iron deficiency anemia, unspecified: Secondary | ICD-10-CM | POA: Diagnosis not present

## 2016-06-11 DIAGNOSIS — M179 Osteoarthritis of knee, unspecified: Secondary | ICD-10-CM | POA: Diagnosis not present

## 2016-06-11 DIAGNOSIS — E1122 Type 2 diabetes mellitus with diabetic chronic kidney disease: Secondary | ICD-10-CM | POA: Diagnosis not present

## 2016-06-11 DIAGNOSIS — N186 End stage renal disease: Secondary | ICD-10-CM | POA: Diagnosis not present

## 2016-06-11 DIAGNOSIS — M6281 Muscle weakness (generalized): Secondary | ICD-10-CM | POA: Diagnosis not present

## 2016-06-11 DIAGNOSIS — D631 Anemia in chronic kidney disease: Secondary | ICD-10-CM | POA: Diagnosis not present

## 2016-06-12 DIAGNOSIS — M6281 Muscle weakness (generalized): Secondary | ICD-10-CM | POA: Diagnosis not present

## 2016-06-12 DIAGNOSIS — M179 Osteoarthritis of knee, unspecified: Secondary | ICD-10-CM | POA: Diagnosis not present

## 2016-06-13 DIAGNOSIS — E1122 Type 2 diabetes mellitus with diabetic chronic kidney disease: Secondary | ICD-10-CM | POA: Diagnosis not present

## 2016-06-13 DIAGNOSIS — D509 Iron deficiency anemia, unspecified: Secondary | ICD-10-CM | POA: Diagnosis not present

## 2016-06-13 DIAGNOSIS — D631 Anemia in chronic kidney disease: Secondary | ICD-10-CM | POA: Diagnosis not present

## 2016-06-13 DIAGNOSIS — M179 Osteoarthritis of knee, unspecified: Secondary | ICD-10-CM | POA: Diagnosis not present

## 2016-06-13 DIAGNOSIS — M6281 Muscle weakness (generalized): Secondary | ICD-10-CM | POA: Diagnosis not present

## 2016-06-13 DIAGNOSIS — N186 End stage renal disease: Secondary | ICD-10-CM | POA: Diagnosis not present

## 2016-06-16 DIAGNOSIS — E1122 Type 2 diabetes mellitus with diabetic chronic kidney disease: Secondary | ICD-10-CM | POA: Diagnosis not present

## 2016-06-16 DIAGNOSIS — N186 End stage renal disease: Secondary | ICD-10-CM | POA: Diagnosis not present

## 2016-06-16 DIAGNOSIS — D509 Iron deficiency anemia, unspecified: Secondary | ICD-10-CM | POA: Diagnosis not present

## 2016-06-16 DIAGNOSIS — D631 Anemia in chronic kidney disease: Secondary | ICD-10-CM | POA: Diagnosis not present

## 2016-06-17 DIAGNOSIS — M6281 Muscle weakness (generalized): Secondary | ICD-10-CM | POA: Diagnosis not present

## 2016-06-17 DIAGNOSIS — M179 Osteoarthritis of knee, unspecified: Secondary | ICD-10-CM | POA: Diagnosis not present

## 2016-06-17 DIAGNOSIS — Z79899 Other long term (current) drug therapy: Secondary | ICD-10-CM | POA: Diagnosis not present

## 2016-06-17 DIAGNOSIS — D649 Anemia, unspecified: Secondary | ICD-10-CM | POA: Diagnosis not present

## 2016-06-17 LAB — HEPATIC FUNCTION PANEL
ALK PHOS: 107 U/L (ref 25–125)
ALT: 24 U/L (ref 7–35)
AST: 31 U/L (ref 13–35)
BILIRUBIN, TOTAL: 0.3 mg/dL

## 2016-06-17 LAB — BASIC METABOLIC PANEL
BUN: 17 mg/dL (ref 4–21)
Creatinine: 2.6 mg/dL — AB (ref 0.5–1.1)
Glucose: 111 mg/dL
Potassium: 4.1 mmol/L (ref 3.4–5.3)
Sodium: 144 mmol/L (ref 137–147)

## 2016-06-17 LAB — CBC AND DIFFERENTIAL
HCT: 28 % — AB (ref 36–46)
HEMOGLOBIN: 8.5 g/dL — AB (ref 12.0–16.0)
NEUTROS ABS: 4 /uL
PLATELETS: 197 10*3/uL (ref 150–399)
WBC: 8.4 10*3/mL

## 2016-06-18 DIAGNOSIS — D509 Iron deficiency anemia, unspecified: Secondary | ICD-10-CM | POA: Diagnosis not present

## 2016-06-18 DIAGNOSIS — D631 Anemia in chronic kidney disease: Secondary | ICD-10-CM | POA: Diagnosis not present

## 2016-06-18 DIAGNOSIS — N186 End stage renal disease: Secondary | ICD-10-CM | POA: Diagnosis not present

## 2016-06-18 DIAGNOSIS — E1122 Type 2 diabetes mellitus with diabetic chronic kidney disease: Secondary | ICD-10-CM | POA: Diagnosis not present

## 2016-06-18 DIAGNOSIS — M6281 Muscle weakness (generalized): Secondary | ICD-10-CM | POA: Diagnosis not present

## 2016-06-18 DIAGNOSIS — M179 Osteoarthritis of knee, unspecified: Secondary | ICD-10-CM | POA: Diagnosis not present

## 2016-06-19 DIAGNOSIS — M6281 Muscle weakness (generalized): Secondary | ICD-10-CM | POA: Diagnosis not present

## 2016-06-19 DIAGNOSIS — M179 Osteoarthritis of knee, unspecified: Secondary | ICD-10-CM | POA: Diagnosis not present

## 2016-06-20 ENCOUNTER — Non-Acute Institutional Stay (SKILLED_NURSING_FACILITY): Payer: Medicare Other | Admitting: Adult Health

## 2016-06-20 ENCOUNTER — Other Ambulatory Visit: Payer: Self-pay | Admitting: *Deleted

## 2016-06-20 ENCOUNTER — Encounter: Payer: Self-pay | Admitting: Adult Health

## 2016-06-20 DIAGNOSIS — N186 End stage renal disease: Secondary | ICD-10-CM | POA: Diagnosis not present

## 2016-06-20 DIAGNOSIS — Z992 Dependence on renal dialysis: Secondary | ICD-10-CM | POA: Diagnosis not present

## 2016-06-20 DIAGNOSIS — M6281 Muscle weakness (generalized): Secondary | ICD-10-CM | POA: Diagnosis not present

## 2016-06-20 DIAGNOSIS — D631 Anemia in chronic kidney disease: Secondary | ICD-10-CM | POA: Diagnosis not present

## 2016-06-20 DIAGNOSIS — E1122 Type 2 diabetes mellitus with diabetic chronic kidney disease: Secondary | ICD-10-CM | POA: Diagnosis not present

## 2016-06-20 DIAGNOSIS — M179 Osteoarthritis of knee, unspecified: Secondary | ICD-10-CM | POA: Diagnosis not present

## 2016-06-20 DIAGNOSIS — D509 Iron deficiency anemia, unspecified: Secondary | ICD-10-CM | POA: Diagnosis not present

## 2016-06-20 DIAGNOSIS — I952 Hypotension due to drugs: Secondary | ICD-10-CM

## 2016-06-20 MED ORDER — DIAZEPAM 5 MG PO TABS: ORAL_TABLET | ORAL | Status: DC

## 2016-06-20 NOTE — Telephone Encounter (Signed)
[facility] 

## 2016-06-20 NOTE — Progress Notes (Signed)
Patient ID: [name] James, female   DOB: 11/17/1936, 80 y.o.   MRN: [id number]    DATE:     06/20/16  Facility:  Nursing Home Location:  [place] Room Number: 205-A LEVEL OF CARE:  SNF (31)   Chief Complaint  Patient presents with  . Acute Visit    Hypotension    HISTORY OF PRESENT ILLNESS:  This is a 80 year old female has been refusing Lopressor during dialysis days M-W-F. It has been noted that Lopressor has been held for several days. Patient verbalized no dizziness.   PAST MEDICAL HISTORY:  Past Medical History  Diagnosis Date  . CHF (congestive heart failure) ([place]) 05/13/2011    echo - [id number]; stage 1 diastolic dysfunction; elevated LV filling pressure, MAC  . Chest pain, atypical 12/05/2008    R/Lmv- normal perfusion all regions, noe ECG changes   . Claudication ([place]) 10/30/2005    doppler - normal evaluation, no evidence of aneurysm, diameter reduction, dissection, compression or vascular abnormality)  . Type 2 diabetes mellitus with diabetic neuropathy, with long-term current use of insulin ([place])   . Peripheral neuropathy ([place])   . Hypertension   . Edema   . Gout   . Anxiety   . Cataract   . GERD (gastroesophageal reflux disease)   . Complication of anesthesia   . PONV (postoperative nausea and vomiting)   . Family history of adverse reaction to anesthesia     son also has nausea   . Dyslipidemia   . Anemia   . Morbid obesity ([place])   . Atrial fibrillation ([place])   . Barrett esophagus   . Renal insufficiency 10/30/2005    doppler - abn resistance consistent w/ parenchymal disease  . CKD (chronic kidney disease), stage III   . Renal cyst   . Arthritis     knees  . Breast cancer ([place]) 1980s    bil mastectomies, no radiation or chemo  . Dysrhythmia     Atrial Fibrillation  . Renovascular hypertension   . ESRD (end stage renal disease) ([place])   . Gastritis and gastroduodenitis   . Insomnia   . Vertigo   . IBS (irritable bowel  syndrome)   . Anemia, chronic disease   . HLD (hyperlipidemia)   . Hemodialysis-associated hypotension   . PAF (paroxysmal atrial fibrillation) ([place])   . Benign paroxysmal positional vertigo     CURRENT MEDICATIONS: Reviewed per MAR/see medication list   Medication List       This list is accurate as of: 06/20/16 10:02 PM.  Always use your most recent med list.               acetaminophen 325 MG tablet  Commonly known as:  TYLENOL  Take 650 mg by mouth every 4 (four) hours as needed for headache (muscle pain).     allopurinol 100 MG tablet  Commonly known as:  ZYLOPRIM  Take 100 mg by mouth daily.     amiodarone 200 MG tablet  Commonly known as:  PACERONE  Take 200 mg by mouth daily.     aspirin 325 MG EC tablet  Take 325 mg by mouth daily.     atorvastatin 80 MG tablet  Commonly known as:  LIPITOR  Take 1 tablet (80 mg total) by mouth daily at 6 PM.     diazepam 5 MG tablet  Commonly known as:  VALIUM  Take 1/2 tablet by mouth every night at bedtime for  rest     dicyclomine 20 MG tablet  Commonly known as:  BENTYL  Take 1 tablet (20 mg total) by mouth 3 (three) times daily.     dorzolamide-timolol 22.3-6.8 MG/ML ophthalmic solution  Commonly known as:  COSOPT  Place 1 drop into the left eye 2 (two) times daily. Wait 2-5 minutes when giving two eye meds     folic acid 1 MG tablet  Commonly known as:  FOLVITE  Take 1 mg by mouth daily.     loperamide 2 MG tablet  Commonly known as:  IMODIUM A-D  Take 2 mg by mouth 3 (three) times daily as needed for diarrhea or loose stools. May take one dose a day and refuse the other two if not needed.     meclizine 12.5 MG tablet  Commonly known as:  ANTIVERT  Take 12.5 mg by mouth 2 (two) times daily as needed for dizziness.     Melatonin 5 MG Tabs  Take 5 mg by mouth at bedtime.     metoprolol 50 MG tablet  Commonly known as:  LOPRESSOR  Take 25-50 mg by mouth 2 (two) times daily. Take 50 mg in the a.m. And 25 mg  in the p.m. Hold for blood pressure <110 and heart rate <60. Hold on dialysis days.     midodrine 10 MG tablet  Commonly known as:  PROAMATINE  Take 10 mg by mouth every Monday, Wednesday, and Friday. On dialysis days     NEPHRO-VITE PO  Take 1 tablet by mouth at bedtime.     ondansetron 4 MG disintegrating tablet  Commonly known as:  ZOFRAN-ODT  Take 4 mg by mouth every 6 (six) hours as needed for nausea or vomiting.     oxycodone 5 MG capsule  Commonly known as:  OXY-IR  Take 5 mg by mouth every 8 (eight) hours.     OXYGEN  Inhale 2 L/min into the lungs as needed.     pantoprazole 40 MG tablet  Commonly known as:  PROTONIX  Take 40 mg by mouth 2 (two) times daily.     UNABLE TO FIND  Med Name: Preparation H CRM Aloe.  Apply to external hemorrhoids BID PRN.         Allergies  Allergen Reactions  . Erythromycin Swelling  . Erythromycin Base     Other reaction(s): GI Upset (intolerance)  . Penicillins Other (See Comments)    Unknown allergic reaction.  She has taken keflex many times without problems  . Codeine Rash and Palpitations    Other reaction(s): GI Upset (intolerance)  . Penicillin G Rash   REVIEW OF SYSTEMS:  GENERAL: no fatigue, no weight changes, no fever, chills  RESPIRATORY: no cough, SOB, DOE, wheezing, hemoptysis CARDIAC: no chest pain GI: no abdominal pain, diarrhea, constipation, heart burn, nausea or vomiting  PHYSICAL EXAMINATION  GENERAL: no acute distress, obese SKIN:  Skin is warm and dry. NECK: supple, trachea midline, no neck masses, no thyroid tenderness, no thyromegaly LYMPHATICS: no LAN in the neck, no supraclavicular LAN RESPIRATORY: breathing is even & unlabored, BS CTAB CARDIAC: RRR, no murmur,no extra heart sounds, BLE edema 1+ GI: abdomen soft, normal BS, no masses, no tenderness, no hepatomegaly, no splenomegaly EXTREMITIES: Able to move all 4 extremities; Left upper arm AV fistula + for bruit and thrill PSYCHIATRIC: the  patient is alert & oriented to person, affect & behavior appropriate  LABS/RADIOLOGY: Labs reviewed: Basic Metabolic Panel:  Recent Labs  12/06/15 03/10/16  06/17/16  NA 143  143 143 144  K 3.9  3.9 5.0 4.1  BUN 12  12 30* 17  CREATININE 2.1*  2.1* 4.7* 2.6*   Liver Function Tests:  Recent Labs  12/06/15 03/10/16 06/17/16  AST 33  33 44* 31  ALT 25  25 34 24  ALKPHOS 103  103 153* 107    CBC:  Recent Labs  12/06/15 03/10/16 06/17/16  WBC 4.1  4.1 6.7 8.4  NEUTROABS 1  1 3 4   HGB 9.5*  9.5* 10.9* 8.5*  HCT 32*  32* 36 28*  PLT 125*  125* 156 197     ASSESSMENT/PLAN:   Hypotension - decrease Lopressor from 50 mg to 25 mg PO Q AM on T_Th_Sat and Sun; Lopressor 25 mg 1/2 tab =12.5 mg PO Q PM on T-Th-Sat-Sun. No Lopressor on M-W-F (during dialysis); hold Lopressor for HR <60 and SBP < [place], NP [place]

## 2016-06-23 DIAGNOSIS — E1122 Type 2 diabetes mellitus with diabetic chronic kidney disease: Secondary | ICD-10-CM | POA: Diagnosis not present

## 2016-06-23 DIAGNOSIS — D631 Anemia in chronic kidney disease: Secondary | ICD-10-CM | POA: Diagnosis not present

## 2016-06-23 DIAGNOSIS — M6281 Muscle weakness (generalized): Secondary | ICD-10-CM | POA: Diagnosis not present

## 2016-06-23 DIAGNOSIS — N186 End stage renal disease: Secondary | ICD-10-CM | POA: Diagnosis not present

## 2016-06-23 DIAGNOSIS — Z23 Encounter for immunization: Secondary | ICD-10-CM | POA: Diagnosis not present

## 2016-06-24 DIAGNOSIS — M6281 Muscle weakness (generalized): Secondary | ICD-10-CM | POA: Diagnosis not present

## 2016-06-25 DIAGNOSIS — E1122 Type 2 diabetes mellitus with diabetic chronic kidney disease: Secondary | ICD-10-CM | POA: Diagnosis not present

## 2016-06-25 DIAGNOSIS — Z23 Encounter for immunization: Secondary | ICD-10-CM | POA: Diagnosis not present

## 2016-06-25 DIAGNOSIS — Z79899 Other long term (current) drug therapy: Secondary | ICD-10-CM | POA: Diagnosis not present

## 2016-06-25 DIAGNOSIS — M6281 Muscle weakness (generalized): Secondary | ICD-10-CM | POA: Diagnosis not present

## 2016-06-25 DIAGNOSIS — N186 End stage renal disease: Secondary | ICD-10-CM | POA: Diagnosis not present

## 2016-06-25 DIAGNOSIS — D631 Anemia in chronic kidney disease: Secondary | ICD-10-CM | POA: Diagnosis not present

## 2016-06-25 LAB — BASIC METABOLIC PANEL
BUN: 27 mg/dL — AB (ref 4–21)
Creatinine: 3.7 mg/dL — AB (ref 0.5–1.1)
Glucose: 106 mg/dL
POTASSIUM: 4.2 mmol/L (ref 3.4–5.3)
SODIUM: 142 mmol/L (ref 137–147)

## 2016-06-26 DIAGNOSIS — M6281 Muscle weakness (generalized): Secondary | ICD-10-CM | POA: Diagnosis not present

## 2016-06-27 DIAGNOSIS — M6281 Muscle weakness (generalized): Secondary | ICD-10-CM | POA: Diagnosis not present

## 2016-06-27 DIAGNOSIS — N186 End stage renal disease: Secondary | ICD-10-CM | POA: Diagnosis not present

## 2016-06-27 DIAGNOSIS — Z23 Encounter for immunization: Secondary | ICD-10-CM | POA: Diagnosis not present

## 2016-06-27 DIAGNOSIS — E1122 Type 2 diabetes mellitus with diabetic chronic kidney disease: Secondary | ICD-10-CM | POA: Diagnosis not present

## 2016-06-27 DIAGNOSIS — D631 Anemia in chronic kidney disease: Secondary | ICD-10-CM | POA: Diagnosis not present

## 2016-06-30 DIAGNOSIS — M6281 Muscle weakness (generalized): Secondary | ICD-10-CM | POA: Diagnosis not present

## 2016-06-30 DIAGNOSIS — E1122 Type 2 diabetes mellitus with diabetic chronic kidney disease: Secondary | ICD-10-CM | POA: Diagnosis not present

## 2016-06-30 DIAGNOSIS — Z23 Encounter for immunization: Secondary | ICD-10-CM | POA: Diagnosis not present

## 2016-06-30 DIAGNOSIS — D631 Anemia in chronic kidney disease: Secondary | ICD-10-CM | POA: Diagnosis not present

## 2016-06-30 DIAGNOSIS — N186 End stage renal disease: Secondary | ICD-10-CM | POA: Diagnosis not present

## 2016-07-01 DIAGNOSIS — M6281 Muscle weakness (generalized): Secondary | ICD-10-CM | POA: Diagnosis not present

## 2016-07-02 DIAGNOSIS — D631 Anemia in chronic kidney disease: Secondary | ICD-10-CM | POA: Diagnosis not present

## 2016-07-02 DIAGNOSIS — M6281 Muscle weakness (generalized): Secondary | ICD-10-CM | POA: Diagnosis not present

## 2016-07-02 DIAGNOSIS — Z23 Encounter for immunization: Secondary | ICD-10-CM | POA: Diagnosis not present

## 2016-07-02 DIAGNOSIS — N186 End stage renal disease: Secondary | ICD-10-CM | POA: Diagnosis not present

## 2016-07-02 DIAGNOSIS — E1122 Type 2 diabetes mellitus with diabetic chronic kidney disease: Secondary | ICD-10-CM | POA: Diagnosis not present

## 2016-07-03 DIAGNOSIS — M6281 Muscle weakness (generalized): Secondary | ICD-10-CM | POA: Diagnosis not present

## 2016-07-04 DIAGNOSIS — D631 Anemia in chronic kidney disease: Secondary | ICD-10-CM | POA: Diagnosis not present

## 2016-07-04 DIAGNOSIS — N186 End stage renal disease: Secondary | ICD-10-CM | POA: Diagnosis not present

## 2016-07-04 DIAGNOSIS — E1122 Type 2 diabetes mellitus with diabetic chronic kidney disease: Secondary | ICD-10-CM | POA: Diagnosis not present

## 2016-07-04 DIAGNOSIS — M6281 Muscle weakness (generalized): Secondary | ICD-10-CM | POA: Diagnosis not present

## 2016-07-04 DIAGNOSIS — Z23 Encounter for immunization: Secondary | ICD-10-CM | POA: Diagnosis not present

## 2016-07-07 DIAGNOSIS — N186 End stage renal disease: Secondary | ICD-10-CM | POA: Diagnosis not present

## 2016-07-07 DIAGNOSIS — E1122 Type 2 diabetes mellitus with diabetic chronic kidney disease: Secondary | ICD-10-CM | POA: Diagnosis not present

## 2016-07-07 DIAGNOSIS — Z23 Encounter for immunization: Secondary | ICD-10-CM | POA: Diagnosis not present

## 2016-07-07 DIAGNOSIS — M6281 Muscle weakness (generalized): Secondary | ICD-10-CM | POA: Diagnosis not present

## 2016-07-07 DIAGNOSIS — D631 Anemia in chronic kidney disease: Secondary | ICD-10-CM | POA: Diagnosis not present

## 2016-07-08 ENCOUNTER — Encounter: Payer: Self-pay | Admitting: Adult Health

## 2016-07-08 ENCOUNTER — Non-Acute Institutional Stay (SKILLED_NURSING_FACILITY): Payer: Medicare Other | Admitting: Adult Health

## 2016-07-08 DIAGNOSIS — Z992 Dependence on renal dialysis: Secondary | ICD-10-CM

## 2016-07-08 DIAGNOSIS — H8110 Benign paroxysmal vertigo, unspecified ear: Secondary | ICD-10-CM

## 2016-07-08 DIAGNOSIS — J309 Allergic rhinitis, unspecified: Secondary | ICD-10-CM | POA: Diagnosis not present

## 2016-07-08 DIAGNOSIS — N186 End stage renal disease: Secondary | ICD-10-CM

## 2016-07-08 DIAGNOSIS — K2990 Gastroduodenitis, unspecified, without bleeding: Secondary | ICD-10-CM

## 2016-07-08 DIAGNOSIS — E785 Hyperlipidemia, unspecified: Secondary | ICD-10-CM

## 2016-07-08 DIAGNOSIS — F419 Anxiety disorder, unspecified: Secondary | ICD-10-CM | POA: Diagnosis not present

## 2016-07-08 DIAGNOSIS — G4700 Insomnia, unspecified: Secondary | ICD-10-CM | POA: Diagnosis not present

## 2016-07-08 DIAGNOSIS — E1122 Type 2 diabetes mellitus with diabetic chronic kidney disease: Secondary | ICD-10-CM | POA: Diagnosis not present

## 2016-07-08 DIAGNOSIS — N182 Chronic kidney disease, stage 2 (mild): Secondary | ICD-10-CM

## 2016-07-08 DIAGNOSIS — I480 Paroxysmal atrial fibrillation: Secondary | ICD-10-CM

## 2016-07-08 DIAGNOSIS — R29898 Other symptoms and signs involving the musculoskeletal system: Secondary | ICD-10-CM | POA: Diagnosis not present

## 2016-07-08 DIAGNOSIS — K2970 Gastritis, unspecified, without bleeding: Secondary | ICD-10-CM | POA: Diagnosis not present

## 2016-07-08 DIAGNOSIS — M1000 Idiopathic gout, unspecified site: Secondary | ICD-10-CM

## 2016-07-08 DIAGNOSIS — I953 Hypotension of hemodialysis: Secondary | ICD-10-CM | POA: Diagnosis not present

## 2016-07-08 NOTE — Progress Notes (Deleted)
Patient ID: [name] James, female   DOB: 1936-01-09, 80 y.o.   MRN: [id number]

## 2016-07-08 NOTE — Progress Notes (Signed)
Patient ID: [name] James, female   DOB: 1936-01-21, 80 y.o.   MRN: [id number]   DATE:     07/08/16  Facility:  Nursing Home Location:  [place] Room Number: 205-B LEVEL OF CARE:  SNF (31)   Chief Complaint  Patient presents with  . Medical Management of Chronic Issues    HISTORY OF PRESENT ILLNESS:  This is a 80 year old female who is being seen for a routine visit. She is a long-term care resident of [facility]. She was recently discharged from PT to RNP. Valium was recently decreased to 2.5 mg Q HS for GDR. However, patient requested for  Valium to be back to 5 mg Q HS. She has been refusing Lopressor on dialysis day and was noted to be hypotensive. Lopressor was ordered to be off during dialysis days. She has requested for Novolog BID PRN for CBG > 250.   PAST MEDICAL HISTORY:  Past Medical History  Diagnosis Date  . CHF (congestive heart failure) ([place]) 05/13/2011    echo - [id number]; stage 1 diastolic dysfunction; elevated LV filling pressure, MAC  . Chest pain, atypical 12/05/2008    R/Lmv- normal perfusion all regions, noe ECG changes   . Claudication ([place]) 10/30/2005    doppler - normal evaluation, no evidence of aneurysm, diameter reduction, dissection, compression or vascular abnormality)  . Type 2 diabetes mellitus with diabetic neuropathy, with long-term current use of insulin ([place])   . Peripheral neuropathy ([place])   . Hypertension   . Edema   . Gout   . Anxiety   . Cataract   . GERD (gastroesophageal reflux disease)   . Complication of anesthesia   . PONV (postoperative nausea and vomiting)   . Family history of adverse reaction to anesthesia     son also has nausea   . Dyslipidemia   . Anemia   . Morbid obesity ([place])   . Atrial fibrillation ([place])   . Barrett esophagus   . Renal insufficiency 10/30/2005    doppler - abn resistance consistent w/ parenchymal disease  . CKD (chronic kidney disease), stage III   . Renal cyst   .  Arthritis     knees  . Breast cancer ([place]) 1980s    bil mastectomies, no radiation or chemo  . Dysrhythmia     Atrial Fibrillation  . Renovascular hypertension   . ESRD (end stage renal disease) ([place])   . Gastritis and gastroduodenitis   . Insomnia   . Vertigo   . IBS (irritable bowel syndrome)   . Anemia, chronic disease   . HLD (hyperlipidemia)   . Hemodialysis-associated hypotension   . PAF (paroxysmal atrial fibrillation) ([place])   . Benign paroxysmal positional vertigo     CURRENT MEDICATIONS: Reviewed per MAR/see medication list   Medication List       This list is accurate as of: 07/08/16  9:42 PM.  Always use your most recent med list.               acetaminophen 325 MG tablet  Commonly known as:  TYLENOL  Take 650 mg by mouth every 4 (four) hours as needed for headache (muscle pain).     allopurinol 100 MG tablet  Commonly known as:  ZYLOPRIM  Take 100 mg by mouth daily.     amiodarone 200 MG tablet  Commonly known as:  PACERONE  Take 200 mg by mouth daily.     aspirin 325 MG EC tablet  Take 325 mg by mouth daily.     atorvastatin 80 MG tablet  Commonly known as:  LIPITOR  Take 1 tablet (80 mg total) by mouth daily at 6 PM.     dicyclomine 20 MG tablet  Commonly known as:  BENTYL  Take 1 tablet (20 mg total) by mouth 3 (three) times daily.     dorzolamide-timolol 22.3-6.8 MG/ML ophthalmic solution  Commonly known as:  COSOPT  Place 1 drop into the left eye 2 (two) times daily. Wait 2-5 minutes when giving two eye meds     folic acid 1 MG tablet  Commonly known as:  FOLVITE  Take 1 mg by mouth daily.     HUMALOG 100 UNIT/ML injection  Generic drug:  insulin lispro  Inject 5 Units into the skin. Give 5 units SQ for CBG >250     loperamide 2 MG tablet  Commonly known as:  IMODIUM A-D  Take 2 mg by mouth 3 (three) times daily as needed for diarrhea or loose stools. May take one dose a day and refuse the other two if not needed.     meclizine 12.5  MG tablet  Commonly known as:  ANTIVERT  Take 12.5 mg by mouth 2 (two) times daily as needed for dizziness.     Melatonin 5 MG Tabs  Take 5 mg by mouth at bedtime.     metoprolol tartrate 25 MG tablet  Commonly known as:  LOPRESSOR  Take 12.5 mg by mouth 2 (two) times daily. Take 12.5 mg Tu, Th, Sat, Sun PM and AM.  No Lopressor on dialysis days of M-W-F     midodrine 10 MG tablet  Commonly known as:  PROAMATINE  Take 10 mg by mouth every Monday, Wednesday, and Friday. On dialysis days     NEPHRO-VITE PO  Take 1 tablet by mouth at bedtime.     ondansetron 4 MG disintegrating tablet  Commonly known as:  ZOFRAN-ODT  Take 4 mg by mouth every 6 (six) hours as needed for nausea or vomiting.     oxycodone 5 MG capsule  Commonly known as:  OXY-IR  Take 5 mg by mouth every 8 (eight) hours.     OXYGEN  Inhale 2 L/min into the lungs as needed.     pantoprazole 40 MG tablet  Commonly known as:  PROTONIX  Take 40 mg by mouth 2 (two) times daily.     UNABLE TO FIND  Med Name: Preparation H CRM Aloe.  Apply to external hemorrhoids BID PRN.     VALIUM 5 MG tablet  Generic drug:  diazepam  Take 2.5 mg by mouth at bedtime. Take 1/2 of a 5 mg tablet to = 2.5 mg qhs         Allergies  Allergen Reactions  . Erythromycin Swelling  . Erythromycin Base     Other reaction(s): GI Upset (intolerance)  . Penicillins Other (See Comments)    Unknown allergic reaction.  She has taken keflex many times without problems  . Codeine Rash and Palpitations    Other reaction(s): GI Upset (intolerance)  . Penicillin G Rash   REVIEW OF SYSTEMS:  GENERAL: no fatigue, no weight changes, no fever, chills  RESPIRATORY: no cough, SOB, DOE, wheezing, hemoptysis CARDIAC: no chest pain GI: no abdominal pain, diarrhea, constipation, heart burn, nausea or vomiting  PHYSICAL EXAMINATION  GENERAL: no acute distress, obese SKIN:  Skin is warm and dry. NECK: supple, trachea midline, no neck masses, no  thyroid tenderness, no thyromegaly LYMPHATICS: no LAN in the neck, no supraclavicular LAN RESPIRATORY: breathing is even & unlabored, BS CTAB, O2 @ 2L/min via [place]  CARDIAC: RRR, no murmur,no extra heart sounds, BLE edema trace GI: abdomen soft, normal BS, no masses, no tenderness, no hepatomegaly, no splenomegaly EXTREMITIES: Able to move all 4 extremities; Left upper arm AV fistula + for bruit and thrill PSYCHIATRIC: the patient is alert & oriented to person, affect & behavior appropriate  LABS/RADIOLOGY: Labs reviewed: Basic Metabolic Panel:  Recent Labs  03/10/16 06/17/16 06/25/16  NA 143 144 142  K 5.0 4.1 4.2  BUN 30* 17 27*  CREATININE 4.7* 2.6* 3.7*   Liver Function Tests:  Recent Labs  12/06/15 03/10/16 06/17/16  AST 33  33 44* 31  ALT 25  25 34 24  ALKPHOS 103  103 153* 107    CBC:  Recent Labs  12/06/15 03/10/16 06/17/16  WBC 4.1  4.1 6.7 8.4  NEUTROABS 1  1 3 4   HGB 9.5*  9.5* 10.9* 8.5*  HCT 32*  32* 36 28*  PLT 125*  125* 156 197     ASSESSMENT/PLAN:   Bilateral lower extremity weakness - recently discharged from physical therapy and now having RNP  Diabetes mellitus with renal complication -   CBGs to be done BID with Novolo 5 units SQ BID for CBG > 250 Lab Results  Component Value Date   HGBA1C 5.1 05/22/2016    Paroxysmal atrial fibrillation - rate controlled; continue amiodarone 200 mg 1 tab by mouth daily and metoprolol tartrate 25 mg 1 tab by mouth every morning on T-Th-Sat_Sun AM and 12.5 mg PO Q PM on T-Th-Sat-Sun. and aspirin 325 mg 1 tab by mouth daily  Allergic rhinitis - continue Claritin 10 mg 1 tab by mouth daily when necessary  Vertigo - continue meclizine 12.5 mg 1 tab by mouth twice a day when necessary  Hemodialysis associated hypotension - continue Midodrin 10 mg 1 tab by mouth every Mondays-Wednesdays-Fridays (dialysis days)  Gout - continue allopurinol 100 mg 1 tab by mouth daily  Hyperlipidemia - continue Lipitor 80  mg 1 tab by mouth daily Lab Results  Component Value Date   CHOL 92 05/22/2016   HDL 35 05/22/2016   LDLCALC 29 05/22/2016   TRIG 142 05/22/2016    Insomnia - continue melatonin 5 mg 1 tab by mouth daily at bedtime  Anxiety - continue volume 5 mg 1 tab by mouth daily at bedtime  Gastritis - continue Protonix 40 mg 1 tab by mouth twice a day  ESRD - on HD Q M-W-F     Goals of care:  Long-term care    Durenda Age, NP [place] [phone]

## 2016-07-09 DIAGNOSIS — Z23 Encounter for immunization: Secondary | ICD-10-CM | POA: Diagnosis not present

## 2016-07-09 DIAGNOSIS — N186 End stage renal disease: Secondary | ICD-10-CM | POA: Diagnosis not present

## 2016-07-09 DIAGNOSIS — E1122 Type 2 diabetes mellitus with diabetic chronic kidney disease: Secondary | ICD-10-CM | POA: Diagnosis not present

## 2016-07-09 DIAGNOSIS — D631 Anemia in chronic kidney disease: Secondary | ICD-10-CM | POA: Diagnosis not present

## 2016-07-11 DIAGNOSIS — E1122 Type 2 diabetes mellitus with diabetic chronic kidney disease: Secondary | ICD-10-CM | POA: Diagnosis not present

## 2016-07-11 DIAGNOSIS — Z23 Encounter for immunization: Secondary | ICD-10-CM | POA: Diagnosis not present

## 2016-07-11 DIAGNOSIS — D631 Anemia in chronic kidney disease: Secondary | ICD-10-CM | POA: Diagnosis not present

## 2016-07-11 DIAGNOSIS — N186 End stage renal disease: Secondary | ICD-10-CM | POA: Diagnosis not present

## 2016-07-14 DIAGNOSIS — N186 End stage renal disease: Secondary | ICD-10-CM | POA: Diagnosis not present

## 2016-07-14 DIAGNOSIS — E1122 Type 2 diabetes mellitus with diabetic chronic kidney disease: Secondary | ICD-10-CM | POA: Diagnosis not present

## 2016-07-14 DIAGNOSIS — D631 Anemia in chronic kidney disease: Secondary | ICD-10-CM | POA: Diagnosis not present

## 2016-07-14 DIAGNOSIS — Z23 Encounter for immunization: Secondary | ICD-10-CM | POA: Diagnosis not present

## 2016-07-15 DIAGNOSIS — Z992 Dependence on renal dialysis: Secondary | ICD-10-CM | POA: Diagnosis not present

## 2016-07-15 DIAGNOSIS — I871 Compression of vein: Secondary | ICD-10-CM | POA: Diagnosis not present

## 2016-07-15 DIAGNOSIS — N186 End stage renal disease: Secondary | ICD-10-CM | POA: Diagnosis not present

## 2016-07-15 DIAGNOSIS — T82858D Stenosis of vascular prosthetic devices, implants and grafts, subsequent encounter: Secondary | ICD-10-CM | POA: Diagnosis not present

## 2016-07-16 DIAGNOSIS — Z23 Encounter for immunization: Secondary | ICD-10-CM | POA: Diagnosis not present

## 2016-07-16 DIAGNOSIS — D631 Anemia in chronic kidney disease: Secondary | ICD-10-CM | POA: Diagnosis not present

## 2016-07-16 DIAGNOSIS — E1122 Type 2 diabetes mellitus with diabetic chronic kidney disease: Secondary | ICD-10-CM | POA: Diagnosis not present

## 2016-07-16 DIAGNOSIS — N186 End stage renal disease: Secondary | ICD-10-CM | POA: Diagnosis not present

## 2016-07-18 DIAGNOSIS — D631 Anemia in chronic kidney disease: Secondary | ICD-10-CM | POA: Diagnosis not present

## 2016-07-18 DIAGNOSIS — E1122 Type 2 diabetes mellitus with diabetic chronic kidney disease: Secondary | ICD-10-CM | POA: Diagnosis not present

## 2016-07-18 DIAGNOSIS — N186 End stage renal disease: Secondary | ICD-10-CM | POA: Diagnosis not present

## 2016-07-18 DIAGNOSIS — Z23 Encounter for immunization: Secondary | ICD-10-CM | POA: Diagnosis not present

## 2016-07-21 DIAGNOSIS — Z992 Dependence on renal dialysis: Secondary | ICD-10-CM | POA: Diagnosis not present

## 2016-07-21 DIAGNOSIS — E1122 Type 2 diabetes mellitus with diabetic chronic kidney disease: Secondary | ICD-10-CM | POA: Diagnosis not present

## 2016-07-21 DIAGNOSIS — D631 Anemia in chronic kidney disease: Secondary | ICD-10-CM | POA: Diagnosis not present

## 2016-07-21 DIAGNOSIS — N186 End stage renal disease: Secondary | ICD-10-CM | POA: Diagnosis not present

## 2016-07-21 DIAGNOSIS — Z23 Encounter for immunization: Secondary | ICD-10-CM | POA: Diagnosis not present

## 2016-07-23 DIAGNOSIS — D509 Iron deficiency anemia, unspecified: Secondary | ICD-10-CM | POA: Diagnosis not present

## 2016-07-23 DIAGNOSIS — N186 End stage renal disease: Secondary | ICD-10-CM | POA: Diagnosis not present

## 2016-07-23 DIAGNOSIS — D631 Anemia in chronic kidney disease: Secondary | ICD-10-CM | POA: Diagnosis not present

## 2016-07-23 DIAGNOSIS — E211 Secondary hyperparathyroidism, not elsewhere classified: Secondary | ICD-10-CM | POA: Diagnosis not present

## 2016-07-23 DIAGNOSIS — E1122 Type 2 diabetes mellitus with diabetic chronic kidney disease: Secondary | ICD-10-CM | POA: Diagnosis not present

## 2016-07-25 DIAGNOSIS — N186 End stage renal disease: Secondary | ICD-10-CM | POA: Diagnosis not present

## 2016-07-25 DIAGNOSIS — E1122 Type 2 diabetes mellitus with diabetic chronic kidney disease: Secondary | ICD-10-CM | POA: Diagnosis not present

## 2016-07-25 DIAGNOSIS — E211 Secondary hyperparathyroidism, not elsewhere classified: Secondary | ICD-10-CM | POA: Diagnosis not present

## 2016-07-25 DIAGNOSIS — D509 Iron deficiency anemia, unspecified: Secondary | ICD-10-CM | POA: Diagnosis not present

## 2016-07-25 DIAGNOSIS — D631 Anemia in chronic kidney disease: Secondary | ICD-10-CM | POA: Diagnosis not present

## 2016-07-28 DIAGNOSIS — D631 Anemia in chronic kidney disease: Secondary | ICD-10-CM | POA: Diagnosis not present

## 2016-07-28 DIAGNOSIS — E211 Secondary hyperparathyroidism, not elsewhere classified: Secondary | ICD-10-CM | POA: Diagnosis not present

## 2016-07-28 DIAGNOSIS — D509 Iron deficiency anemia, unspecified: Secondary | ICD-10-CM | POA: Diagnosis not present

## 2016-07-28 DIAGNOSIS — E1122 Type 2 diabetes mellitus with diabetic chronic kidney disease: Secondary | ICD-10-CM | POA: Diagnosis not present

## 2016-07-28 DIAGNOSIS — N186 End stage renal disease: Secondary | ICD-10-CM | POA: Diagnosis not present

## 2016-07-30 DIAGNOSIS — E1122 Type 2 diabetes mellitus with diabetic chronic kidney disease: Secondary | ICD-10-CM | POA: Diagnosis not present

## 2016-07-30 DIAGNOSIS — N186 End stage renal disease: Secondary | ICD-10-CM | POA: Diagnosis not present

## 2016-07-30 DIAGNOSIS — D631 Anemia in chronic kidney disease: Secondary | ICD-10-CM | POA: Diagnosis not present

## 2016-07-30 DIAGNOSIS — E211 Secondary hyperparathyroidism, not elsewhere classified: Secondary | ICD-10-CM | POA: Diagnosis not present

## 2016-07-30 DIAGNOSIS — D509 Iron deficiency anemia, unspecified: Secondary | ICD-10-CM | POA: Diagnosis not present

## 2016-07-31 DIAGNOSIS — D485 Neoplasm of uncertain behavior of skin: Secondary | ICD-10-CM | POA: Diagnosis not present

## 2016-07-31 DIAGNOSIS — Z85828 Personal history of other malignant neoplasm of skin: Secondary | ICD-10-CM | POA: Diagnosis not present

## 2016-07-31 DIAGNOSIS — L738 Other specified follicular disorders: Secondary | ICD-10-CM | POA: Diagnosis not present

## 2016-07-31 DIAGNOSIS — C44311 Basal cell carcinoma of skin of nose: Secondary | ICD-10-CM | POA: Diagnosis not present

## 2016-08-01 DIAGNOSIS — E211 Secondary hyperparathyroidism, not elsewhere classified: Secondary | ICD-10-CM | POA: Diagnosis not present

## 2016-08-01 DIAGNOSIS — D509 Iron deficiency anemia, unspecified: Secondary | ICD-10-CM | POA: Diagnosis not present

## 2016-08-01 DIAGNOSIS — D631 Anemia in chronic kidney disease: Secondary | ICD-10-CM | POA: Diagnosis not present

## 2016-08-01 DIAGNOSIS — E1122 Type 2 diabetes mellitus with diabetic chronic kidney disease: Secondary | ICD-10-CM | POA: Diagnosis not present

## 2016-08-01 DIAGNOSIS — N186 End stage renal disease: Secondary | ICD-10-CM | POA: Diagnosis not present

## 2016-08-04 ENCOUNTER — Other Ambulatory Visit: Payer: Self-pay | Admitting: *Deleted

## 2016-08-04 DIAGNOSIS — N186 End stage renal disease: Secondary | ICD-10-CM | POA: Diagnosis not present

## 2016-08-04 DIAGNOSIS — D631 Anemia in chronic kidney disease: Secondary | ICD-10-CM | POA: Diagnosis not present

## 2016-08-04 DIAGNOSIS — E1122 Type 2 diabetes mellitus with diabetic chronic kidney disease: Secondary | ICD-10-CM | POA: Diagnosis not present

## 2016-08-04 DIAGNOSIS — D509 Iron deficiency anemia, unspecified: Secondary | ICD-10-CM | POA: Diagnosis not present

## 2016-08-04 DIAGNOSIS — E211 Secondary hyperparathyroidism, not elsewhere classified: Secondary | ICD-10-CM | POA: Diagnosis not present

## 2016-08-04 MED ORDER — DIAZEPAM 5 MG PO TABS: ORAL_TABLET | ORAL | 0 refills | Status: DC

## 2016-08-04 NOTE — Telephone Encounter (Signed)
[facility] #[phone] Fax: [phone] 

## 2016-08-05 ENCOUNTER — Non-Acute Institutional Stay (SKILLED_NURSING_FACILITY): Payer: Medicare Other | Admitting: Adult Health

## 2016-08-05 ENCOUNTER — Encounter: Payer: Self-pay | Admitting: Adult Health

## 2016-08-05 DIAGNOSIS — K2970 Gastritis, unspecified, without bleeding: Secondary | ICD-10-CM | POA: Diagnosis not present

## 2016-08-05 DIAGNOSIS — E1122 Type 2 diabetes mellitus with diabetic chronic kidney disease: Secondary | ICD-10-CM | POA: Diagnosis not present

## 2016-08-05 DIAGNOSIS — H409 Unspecified glaucoma: Secondary | ICD-10-CM

## 2016-08-05 DIAGNOSIS — G4700 Insomnia, unspecified: Secondary | ICD-10-CM | POA: Diagnosis not present

## 2016-08-05 DIAGNOSIS — I480 Paroxysmal atrial fibrillation: Secondary | ICD-10-CM | POA: Diagnosis not present

## 2016-08-05 DIAGNOSIS — K589 Irritable bowel syndrome without diarrhea: Secondary | ICD-10-CM

## 2016-08-05 DIAGNOSIS — F419 Anxiety disorder, unspecified: Secondary | ICD-10-CM

## 2016-08-05 DIAGNOSIS — N182 Chronic kidney disease, stage 2 (mild): Secondary | ICD-10-CM

## 2016-08-05 DIAGNOSIS — I952 Hypotension due to drugs: Secondary | ICD-10-CM

## 2016-08-05 DIAGNOSIS — E785 Hyperlipidemia, unspecified: Secondary | ICD-10-CM

## 2016-08-05 DIAGNOSIS — K2990 Gastroduodenitis, unspecified, without bleeding: Secondary | ICD-10-CM

## 2016-08-05 DIAGNOSIS — I953 Hypotension of hemodialysis: Secondary | ICD-10-CM

## 2016-08-05 DIAGNOSIS — J309 Allergic rhinitis, unspecified: Secondary | ICD-10-CM | POA: Diagnosis not present

## 2016-08-05 DIAGNOSIS — N186 End stage renal disease: Secondary | ICD-10-CM | POA: Diagnosis not present

## 2016-08-05 DIAGNOSIS — M1990 Unspecified osteoarthritis, unspecified site: Secondary | ICD-10-CM

## 2016-08-05 DIAGNOSIS — M1000 Idiopathic gout, unspecified site: Secondary | ICD-10-CM

## 2016-08-05 DIAGNOSIS — I5042 Chronic combined systolic (congestive) and diastolic (congestive) heart failure: Secondary | ICD-10-CM

## 2016-08-05 DIAGNOSIS — Z992 Dependence on renal dialysis: Secondary | ICD-10-CM

## 2016-08-05 NOTE — Progress Notes (Signed)
Patient ID: [name] James, female   DOB: 05-03-1936, 80 y.o.   MRN: [id number]   DATE:     08/05/16  Facility:  Nursing Home Location:  [place] Room Number: 205-A LEVEL OF CARE:  SNF (31)   Chief Complaint  Patient presents with  . Medical Management of Chronic Issues    HISTORY OF PRESENT ILLNESS:  This is a 80 year old female who is being seen for a routine visit. She is a long-term care resident of [facility]. She was recently started on Zyrtec and Flonase due to allergic rhinitis. Claritin was discontinued. She had a recent biopsy on her lesion at the left nostril, awaiting result. PT has been transitioned to RNP.  PAST MEDICAL HISTORY:  Past Medical History:  Diagnosis Date  . Anemia, chronic disease   . Anxiety   . Arthritis    knees  . Atrial fibrillation ([place])   . Barrett esophagus   . Benign paroxysmal positional vertigo   . Breast cancer ([place]) 1980s   bil mastectomies, no radiation or chemo  . Cataract   . Chest pain, atypical 12/05/2008   R/Lmv- normal perfusion all regions, noe ECG changes   . CHF (congestive heart failure) ([place]) 05/13/2011   echo - [id number]; stage 1 diastolic dysfunction; elevated LV filling pressure, MAC  . CKD (chronic kidney disease), stage III   . Claudication ([place]) 10/30/2005   doppler - normal evaluation, no evidence of aneurysm, diameter reduction, dissection, compression or vascular abnormality)  . Complication of anesthesia   . Dyslipidemia   . Dysrhythmia    Atrial Fibrillation  . Edema   . ESRD (end stage renal disease) ([place])   . Family history of adverse reaction to anesthesia    son also has nausea   . Gastritis and gastroduodenitis   . GERD (gastroesophageal reflux disease)   . Gout   . Hemodialysis-associated hypotension   . HLD (hyperlipidemia)   . Hypertension   . IBS (irritable bowel syndrome)   . Insomnia   . Morbid obesity ([place])   . PAF (paroxysmal atrial fibrillation) ([place])   .  Peripheral neuropathy ([place])   . PONV (postoperative nausea and vomiting)   . Renal cyst   . Renal insufficiency 10/30/2005   doppler - abn resistance consistent w/ parenchymal disease  . Renovascular hypertension   . Type 2 diabetes mellitus with diabetic neuropathy, with long-term current use of insulin ([place])   . Vertigo     CURRENT MEDICATIONS: Reviewed per MAR/see medication list   Medication List       Accurate as of 08/05/16  9:32 PM. Always use your most recent med list.          acetaminophen 325 MG tablet Commonly known as:  TYLENOL Take 650 mg by mouth every 4 (four) hours as needed for headache (muscle pain).   allopurinol 100 MG tablet Commonly known as:  ZYLOPRIM Take 100 mg by mouth daily.   amiodarone 200 MG tablet Commonly known as:  PACERONE Take 200 mg by mouth daily.   aspirin 325 MG EC tablet Take 325 mg by mouth daily.   atorvastatin 80 MG tablet Commonly known as:  LIPITOR Take 1 tablet (80 mg total) by mouth daily at 6 PM.   cetirizine 10 MG tablet Commonly known as:  ZYRTEC Take 5 mg by mouth daily. Take 1/2 tablet to = 5 mg qd   diazepam 5 MG tablet Commonly known as:  VALIUM Take  one tablet by mouth every night at bedtime for rest   dicyclomine 20 MG tablet Commonly known as:  BENTYL Take 1 tablet (20 mg total) by mouth 3 (three) times daily.   dorzolamide-timolol 22.3-6.8 MG/ML ophthalmic solution Commonly known as:  COSOPT Place 1 drop into the left eye 2 (two) times daily. Wait 2-5 minutes when giving two eye meds   FLONASE ALLERGY RELIEF 50 MCG/ACT nasal spray Generic drug:  fluticasone Place 2 sprays into both nostrils daily.   folic acid 1 MG tablet Commonly known as:  FOLVITE Take 1 mg by mouth daily.   HUMALOG 100 UNIT/ML injection Generic drug:  insulin lispro Inject 5 Units into the skin. Give 5 units SQ for CBG >250   loperamide 2 MG tablet Commonly known as:  IMODIUM A-D Take 2 mg by mouth 3 (three) times daily as  needed for diarrhea or loose stools. May take one dose a day and refuse the other two if not needed.   meclizine 12.5 MG tablet Commonly known as:  ANTIVERT Take 12.5 mg by mouth 2 (two) times daily as needed for dizziness.   Melatonin 5 MG Tabs Take 5 mg by mouth at bedtime.   metoprolol tartrate 25 MG tablet Commonly known as:  LOPRESSOR Take 12.5-25 mg by mouth 2 (two) times daily. Take 12.5 mg Tu, Th, Sat, Sun PM, take 25 mg QAM.  No Lopressor on dialysis days of M-W-F.  Hold for SBP <100, HR <60   midodrine 10 MG tablet Commonly known as:  PROAMATINE Take 10 mg by mouth every Monday, Wednesday, and Friday. On dialysis days   NEPHRO-VITE PO Take 1 tablet by mouth at bedtime.   ondansetron 4 MG disintegrating tablet Commonly known as:  ZOFRAN-ODT Take 4 mg by mouth every 6 (six) hours as needed for nausea or vomiting.   oxycodone 5 MG capsule Commonly known as:  OXY-IR Take 5 mg by mouth every 8 (eight) hours.   OXYGEN Inhale 2 L/min into the lungs as needed.   pantoprazole 40 MG tablet Commonly known as:  PROTONIX Take 40 mg by mouth 2 (two) times daily.   UNABLE TO FIND Med Name: Preparation H CRM Aloe.  Apply to external hemorrhoids BID PRN.        Allergies  Allergen Reactions  . Erythromycin Swelling  . Erythromycin Base     Other reaction(s): GI Upset (intolerance)  . Penicillins Other (See Comments)    Unknown allergic reaction.  She has taken keflex many times without problems  . Codeine Rash and Palpitations    Other reaction(s): GI Upset (intolerance)  . Penicillin G Rash   REVIEW OF SYSTEMS:  GENERAL: no fatigue, no weight changes, no fever, chills  RESPIRATORY: no cough, SOB, DOE, wheezing, hemoptysis CARDIAC: no chest pain GI: no abdominal pain, diarrhea, constipation, heart burn, nausea or vomiting  PHYSICAL EXAMINATION  GENERAL: no acute distress, obese SKIN:  Skin is warm and dry. NECK: supple, trachea midline, no neck masses, no  thyroid tenderness, no thyromegaly LYMPHATICS: no LAN in the neck, no supraclavicular LAN RESPIRATORY: breathing is even & unlabored, BS CTAB, O2 @ 2L/min via [place]  CARDIAC: RRR, no murmur,no extra heart sounds, BLE edema trace GI: abdomen soft, normal BS, no masses, no tenderness, no hepatomegaly, no splenomegaly EXTREMITIES: Able to move all 4 extremities; Left upper arm AV fistula + for bruit and thrill PSYCHIATRIC: the patient is alert & oriented to person, affect & behavior appropriate  LABS/RADIOLOGY: Labs reviewed:  Basic Metabolic Panel:  Recent Labs  03/10/16 06/17/16 06/25/16  NA 143 144 142  K 5.0 4.1 4.2  BUN 30* 17 27*  CREATININE 4.7* 2.6* 3.7*   Liver Function Tests:  Recent Labs  12/06/15 03/10/16 06/17/16  AST 33  33 44* 31  ALT 25  25 34 24  ALKPHOS 103  103 153* 107    CBC:  Recent Labs  12/06/15 03/10/16 06/17/16  WBC 4.1  4.1 6.7 8.4  NEUTROABS 1  1 3 4   HGB 9.5*  9.5* 10.9* 8.5*  HCT 32*  32* 36 28*  PLT 125*  125* 156 197     ASSESSMENT/PLAN:   Chronic combined systolic and diastolic CHF - continue O@ @ 2L/min via [place] continuously  IBS - continue Bentyl 20 mg 1 tab by mouth 3 times a day and Imodium AD 2 mg 1 caplet by mouth 3 times a day  Glaucoma - continue Cosopt eye gtts instill 1 gtt into left eye BID  Diabetes mellitus with renal complication -   CBGs to be done BID with Novolog 5 units SQ BID for CBG > 250 Lab Results  Component Value Date   HGBA1C 5.1 05/22/2016    Paroxysmal atrial fibrillation - rate controlled; continue amiodarone 200 mg 1 tab by mouth daily and metoprolol tartrate 25 mg 1 tab by mouth every morning on T-Th-Sat_Sun AM and 12.5 mg PO Q PM on T-Th-Sat-Sun. and aspirin 325 mg 1 tab by mouth daily  Allergic rhinitis - Claritin was recently discontinued; recently started on Zyrtec 5 mg 1 tab by mouth daily at bedtime and Flonase 50 g 2 sprays into each nostril daily  Vertigo - continue meclizine 12.5 mg 1 tab  by mouth twice a day when necessary  Hemodialysis associated hypotension - continue Midodrin 10 mg 1 tab by mouth every Mondays-Wednesdays-Fridays (dialysis days)  Gout - continue allopurinol 100 mg 1 tab by mouth daily  Hyperlipidemia - continue Lipitor 80 mg 1 tab by mouth daily Lab Results  Component Value Date   CHOL 92 05/22/2016   HDL 35 05/22/2016   LDLCALC 29 05/22/2016   TRIG 142 05/22/2016    Insomnia - continue melatonin 5 mg 1 tab by mouth daily at bedtime  Anxiety - continue Valium 5 mg 1 tab by mouth daily at bedtime  Gastritis - continue Protonix 40 mg 1 tab by mouth twice a day  ESRD - on HD Q M-W-F  Osteoarthrosis - continue oxycodone 5 mg 1 tab by mouth every 8 hours when necessary and acetaminophen 325 mg take 2 tabs = 650 mg by mouth every 4 hours when necessary     Goals of care:  Long-term care    Durenda Age, NP [place] [phone]

## 2016-08-06 DIAGNOSIS — N186 End stage renal disease: Secondary | ICD-10-CM | POA: Diagnosis not present

## 2016-08-06 DIAGNOSIS — D509 Iron deficiency anemia, unspecified: Secondary | ICD-10-CM | POA: Diagnosis not present

## 2016-08-06 DIAGNOSIS — D631 Anemia in chronic kidney disease: Secondary | ICD-10-CM | POA: Diagnosis not present

## 2016-08-06 DIAGNOSIS — E211 Secondary hyperparathyroidism, not elsewhere classified: Secondary | ICD-10-CM | POA: Diagnosis not present

## 2016-08-06 DIAGNOSIS — E1122 Type 2 diabetes mellitus with diabetic chronic kidney disease: Secondary | ICD-10-CM | POA: Diagnosis not present

## 2016-08-08 DIAGNOSIS — D509 Iron deficiency anemia, unspecified: Secondary | ICD-10-CM | POA: Diagnosis not present

## 2016-08-08 DIAGNOSIS — E1122 Type 2 diabetes mellitus with diabetic chronic kidney disease: Secondary | ICD-10-CM | POA: Diagnosis not present

## 2016-08-08 DIAGNOSIS — E211 Secondary hyperparathyroidism, not elsewhere classified: Secondary | ICD-10-CM | POA: Diagnosis not present

## 2016-08-08 DIAGNOSIS — D631 Anemia in chronic kidney disease: Secondary | ICD-10-CM | POA: Diagnosis not present

## 2016-08-08 DIAGNOSIS — N186 End stage renal disease: Secondary | ICD-10-CM | POA: Diagnosis not present

## 2016-08-11 DIAGNOSIS — N186 End stage renal disease: Secondary | ICD-10-CM | POA: Diagnosis not present

## 2016-08-11 DIAGNOSIS — D631 Anemia in chronic kidney disease: Secondary | ICD-10-CM | POA: Diagnosis not present

## 2016-08-11 DIAGNOSIS — E211 Secondary hyperparathyroidism, not elsewhere classified: Secondary | ICD-10-CM | POA: Diagnosis not present

## 2016-08-11 DIAGNOSIS — E1122 Type 2 diabetes mellitus with diabetic chronic kidney disease: Secondary | ICD-10-CM | POA: Diagnosis not present

## 2016-08-11 DIAGNOSIS — D509 Iron deficiency anemia, unspecified: Secondary | ICD-10-CM | POA: Diagnosis not present

## 2016-08-13 DIAGNOSIS — D509 Iron deficiency anemia, unspecified: Secondary | ICD-10-CM | POA: Diagnosis not present

## 2016-08-13 DIAGNOSIS — E211 Secondary hyperparathyroidism, not elsewhere classified: Secondary | ICD-10-CM | POA: Diagnosis not present

## 2016-08-13 DIAGNOSIS — E1122 Type 2 diabetes mellitus with diabetic chronic kidney disease: Secondary | ICD-10-CM | POA: Diagnosis not present

## 2016-08-13 DIAGNOSIS — D631 Anemia in chronic kidney disease: Secondary | ICD-10-CM | POA: Diagnosis not present

## 2016-08-13 DIAGNOSIS — N186 End stage renal disease: Secondary | ICD-10-CM | POA: Diagnosis not present

## 2016-08-15 DIAGNOSIS — D631 Anemia in chronic kidney disease: Secondary | ICD-10-CM | POA: Diagnosis not present

## 2016-08-15 DIAGNOSIS — E211 Secondary hyperparathyroidism, not elsewhere classified: Secondary | ICD-10-CM | POA: Diagnosis not present

## 2016-08-15 DIAGNOSIS — N186 End stage renal disease: Secondary | ICD-10-CM | POA: Diagnosis not present

## 2016-08-15 DIAGNOSIS — E1122 Type 2 diabetes mellitus with diabetic chronic kidney disease: Secondary | ICD-10-CM | POA: Diagnosis not present

## 2016-08-15 DIAGNOSIS — D509 Iron deficiency anemia, unspecified: Secondary | ICD-10-CM | POA: Diagnosis not present

## 2016-08-18 DIAGNOSIS — D509 Iron deficiency anemia, unspecified: Secondary | ICD-10-CM | POA: Diagnosis not present

## 2016-08-18 DIAGNOSIS — N186 End stage renal disease: Secondary | ICD-10-CM | POA: Diagnosis not present

## 2016-08-18 DIAGNOSIS — E211 Secondary hyperparathyroidism, not elsewhere classified: Secondary | ICD-10-CM | POA: Diagnosis not present

## 2016-08-18 DIAGNOSIS — D631 Anemia in chronic kidney disease: Secondary | ICD-10-CM | POA: Diagnosis not present

## 2016-08-18 DIAGNOSIS — E1122 Type 2 diabetes mellitus with diabetic chronic kidney disease: Secondary | ICD-10-CM | POA: Diagnosis not present

## 2016-08-20 DIAGNOSIS — E211 Secondary hyperparathyroidism, not elsewhere classified: Secondary | ICD-10-CM | POA: Diagnosis not present

## 2016-08-20 DIAGNOSIS — D509 Iron deficiency anemia, unspecified: Secondary | ICD-10-CM | POA: Diagnosis not present

## 2016-08-20 DIAGNOSIS — N186 End stage renal disease: Secondary | ICD-10-CM | POA: Diagnosis not present

## 2016-08-20 DIAGNOSIS — E1122 Type 2 diabetes mellitus with diabetic chronic kidney disease: Secondary | ICD-10-CM | POA: Diagnosis not present

## 2016-08-20 DIAGNOSIS — D631 Anemia in chronic kidney disease: Secondary | ICD-10-CM | POA: Diagnosis not present

## 2016-08-21 DIAGNOSIS — N186 End stage renal disease: Secondary | ICD-10-CM | POA: Diagnosis not present

## 2016-08-21 DIAGNOSIS — E1122 Type 2 diabetes mellitus with diabetic chronic kidney disease: Secondary | ICD-10-CM | POA: Diagnosis not present

## 2016-08-21 DIAGNOSIS — Z992 Dependence on renal dialysis: Secondary | ICD-10-CM | POA: Diagnosis not present

## 2016-08-22 DIAGNOSIS — Z23 Encounter for immunization: Secondary | ICD-10-CM | POA: Diagnosis not present

## 2016-08-22 DIAGNOSIS — E875 Hyperkalemia: Secondary | ICD-10-CM | POA: Diagnosis not present

## 2016-08-22 DIAGNOSIS — N2581 Secondary hyperparathyroidism of renal origin: Secondary | ICD-10-CM | POA: Diagnosis not present

## 2016-08-22 DIAGNOSIS — N186 End stage renal disease: Secondary | ICD-10-CM | POA: Diagnosis not present

## 2016-08-22 DIAGNOSIS — E1122 Type 2 diabetes mellitus with diabetic chronic kidney disease: Secondary | ICD-10-CM | POA: Diagnosis not present

## 2016-08-22 DIAGNOSIS — E211 Secondary hyperparathyroidism, not elsewhere classified: Secondary | ICD-10-CM | POA: Diagnosis not present

## 2016-08-25 DIAGNOSIS — N2581 Secondary hyperparathyroidism of renal origin: Secondary | ICD-10-CM | POA: Diagnosis not present

## 2016-08-25 DIAGNOSIS — Z23 Encounter for immunization: Secondary | ICD-10-CM | POA: Diagnosis not present

## 2016-08-25 DIAGNOSIS — E875 Hyperkalemia: Secondary | ICD-10-CM | POA: Diagnosis not present

## 2016-08-25 DIAGNOSIS — E211 Secondary hyperparathyroidism, not elsewhere classified: Secondary | ICD-10-CM | POA: Diagnosis not present

## 2016-08-25 DIAGNOSIS — E1122 Type 2 diabetes mellitus with diabetic chronic kidney disease: Secondary | ICD-10-CM | POA: Diagnosis not present

## 2016-08-25 DIAGNOSIS — N186 End stage renal disease: Secondary | ICD-10-CM | POA: Diagnosis not present

## 2016-08-27 DIAGNOSIS — Z23 Encounter for immunization: Secondary | ICD-10-CM | POA: Diagnosis not present

## 2016-08-27 DIAGNOSIS — E875 Hyperkalemia: Secondary | ICD-10-CM | POA: Diagnosis not present

## 2016-08-27 DIAGNOSIS — N186 End stage renal disease: Secondary | ICD-10-CM | POA: Diagnosis not present

## 2016-08-27 DIAGNOSIS — N2581 Secondary hyperparathyroidism of renal origin: Secondary | ICD-10-CM | POA: Diagnosis not present

## 2016-08-27 DIAGNOSIS — E1122 Type 2 diabetes mellitus with diabetic chronic kidney disease: Secondary | ICD-10-CM | POA: Diagnosis not present

## 2016-08-27 DIAGNOSIS — E211 Secondary hyperparathyroidism, not elsewhere classified: Secondary | ICD-10-CM | POA: Diagnosis not present

## 2016-08-29 DIAGNOSIS — E211 Secondary hyperparathyroidism, not elsewhere classified: Secondary | ICD-10-CM | POA: Diagnosis not present

## 2016-08-29 DIAGNOSIS — E1122 Type 2 diabetes mellitus with diabetic chronic kidney disease: Secondary | ICD-10-CM | POA: Diagnosis not present

## 2016-08-29 DIAGNOSIS — Z23 Encounter for immunization: Secondary | ICD-10-CM | POA: Diagnosis not present

## 2016-08-29 DIAGNOSIS — E875 Hyperkalemia: Secondary | ICD-10-CM | POA: Diagnosis not present

## 2016-08-29 DIAGNOSIS — N2581 Secondary hyperparathyroidism of renal origin: Secondary | ICD-10-CM | POA: Diagnosis not present

## 2016-08-29 DIAGNOSIS — N186 End stage renal disease: Secondary | ICD-10-CM | POA: Diagnosis not present

## 2016-09-01 DIAGNOSIS — Z23 Encounter for immunization: Secondary | ICD-10-CM | POA: Diagnosis not present

## 2016-09-01 DIAGNOSIS — N186 End stage renal disease: Secondary | ICD-10-CM | POA: Diagnosis not present

## 2016-09-01 DIAGNOSIS — N2581 Secondary hyperparathyroidism of renal origin: Secondary | ICD-10-CM | POA: Diagnosis not present

## 2016-09-01 DIAGNOSIS — E211 Secondary hyperparathyroidism, not elsewhere classified: Secondary | ICD-10-CM | POA: Diagnosis not present

## 2016-09-01 DIAGNOSIS — E875 Hyperkalemia: Secondary | ICD-10-CM | POA: Diagnosis not present

## 2016-09-01 DIAGNOSIS — E1122 Type 2 diabetes mellitus with diabetic chronic kidney disease: Secondary | ICD-10-CM | POA: Diagnosis not present

## 2016-09-02 DIAGNOSIS — C44311 Basal cell carcinoma of skin of nose: Secondary | ICD-10-CM | POA: Diagnosis not present

## 2016-09-02 DIAGNOSIS — Z85828 Personal history of other malignant neoplasm of skin: Secondary | ICD-10-CM | POA: Diagnosis not present

## 2016-09-03 ENCOUNTER — Encounter: Payer: Self-pay | Admitting: Adult Health

## 2016-09-03 ENCOUNTER — Non-Acute Institutional Stay (SKILLED_NURSING_FACILITY): Payer: Medicare Other | Admitting: Adult Health

## 2016-09-03 DIAGNOSIS — I952 Hypotension due to drugs: Secondary | ICD-10-CM | POA: Diagnosis not present

## 2016-09-03 DIAGNOSIS — N186 End stage renal disease: Secondary | ICD-10-CM | POA: Diagnosis not present

## 2016-09-03 DIAGNOSIS — K2970 Gastritis, unspecified, without bleeding: Secondary | ICD-10-CM | POA: Diagnosis not present

## 2016-09-03 DIAGNOSIS — N182 Chronic kidney disease, stage 2 (mild): Secondary | ICD-10-CM

## 2016-09-03 DIAGNOSIS — C4491 Basal cell carcinoma of skin, unspecified: Secondary | ICD-10-CM | POA: Diagnosis not present

## 2016-09-03 DIAGNOSIS — I953 Hypotension of hemodialysis: Secondary | ICD-10-CM

## 2016-09-03 DIAGNOSIS — K589 Irritable bowel syndrome without diarrhea: Secondary | ICD-10-CM

## 2016-09-03 DIAGNOSIS — E1122 Type 2 diabetes mellitus with diabetic chronic kidney disease: Secondary | ICD-10-CM | POA: Diagnosis not present

## 2016-09-03 DIAGNOSIS — E785 Hyperlipidemia, unspecified: Secondary | ICD-10-CM | POA: Diagnosis not present

## 2016-09-03 DIAGNOSIS — Z992 Dependence on renal dialysis: Secondary | ICD-10-CM

## 2016-09-03 DIAGNOSIS — J309 Allergic rhinitis, unspecified: Secondary | ICD-10-CM

## 2016-09-03 DIAGNOSIS — H409 Unspecified glaucoma: Secondary | ICD-10-CM

## 2016-09-03 DIAGNOSIS — E875 Hyperkalemia: Secondary | ICD-10-CM | POA: Diagnosis not present

## 2016-09-03 DIAGNOSIS — E211 Secondary hyperparathyroidism, not elsewhere classified: Secondary | ICD-10-CM | POA: Diagnosis not present

## 2016-09-03 DIAGNOSIS — I480 Paroxysmal atrial fibrillation: Secondary | ICD-10-CM

## 2016-09-03 DIAGNOSIS — F419 Anxiety disorder, unspecified: Secondary | ICD-10-CM

## 2016-09-03 DIAGNOSIS — K2990 Gastroduodenitis, unspecified, without bleeding: Secondary | ICD-10-CM

## 2016-09-03 DIAGNOSIS — M1990 Unspecified osteoarthritis, unspecified site: Secondary | ICD-10-CM | POA: Diagnosis not present

## 2016-09-03 DIAGNOSIS — M1000 Idiopathic gout, unspecified site: Secondary | ICD-10-CM | POA: Diagnosis not present

## 2016-09-03 DIAGNOSIS — I5042 Chronic combined systolic (congestive) and diastolic (congestive) heart failure: Secondary | ICD-10-CM | POA: Diagnosis not present

## 2016-09-03 DIAGNOSIS — N2581 Secondary hyperparathyroidism of renal origin: Secondary | ICD-10-CM | POA: Diagnosis not present

## 2016-09-03 DIAGNOSIS — G4700 Insomnia, unspecified: Secondary | ICD-10-CM | POA: Diagnosis not present

## 2016-09-03 DIAGNOSIS — D638 Anemia in other chronic diseases classified elsewhere: Secondary | ICD-10-CM

## 2016-09-03 DIAGNOSIS — Z23 Encounter for immunization: Secondary | ICD-10-CM | POA: Diagnosis not present

## 2016-09-03 NOTE — Progress Notes (Signed)
Patient ID: [name] James, female   DOB: 31-Mar-1936, 80 y.o.   MRN: [id number]   DATE:     09/03/16  Facility:  Nursing Home Location:  [place] Room Number: 205-A LEVEL OF CARE:  SNF (31)   Chief Complaint  Patient presents with  . Medical Management of Chronic Issues    HISTORY OF PRESENT ILLNESS:  This is a 80 year old female who is being seen for a routine visit. She is a long-term care resident of [facility]. She had mohs surgery on her left nose BCE yesterday, 09/03/16. She is currently on Doxycycline for prophylaxis to prevent infection.   PAST MEDICAL HISTORY:  Past Medical History:  Diagnosis Date  . Anemia, chronic disease   . Anxiety   . Arthritis    knees  . Atrial fibrillation ([place])   . Barrett esophagus   . Benign paroxysmal positional vertigo   . Breast cancer ([place]) 1980s   bil mastectomies, no radiation or chemo  . Cataract   . Chest pain, atypical 12/05/2008   R/Lmv- normal perfusion all regions, noe ECG changes   . CHF (congestive heart failure) ([place]) 05/13/2011   echo - LK>91%; stage 1 diastolic dysfunction; elevated LV filling pressure, MAC  . CKD (chronic kidney disease), stage III   . Claudication ([place]) 10/30/2005   doppler - normal evaluation, no evidence of aneurysm, diameter reduction, dissection, compression or vascular abnormality)  . Complication of anesthesia   . Dyslipidemia   . Dysrhythmia    Atrial Fibrillation  . Edema   . ESRD (end stage renal disease) ([place])   . Family history of adverse reaction to anesthesia    son also has nausea   . Gastritis and gastroduodenitis   . GERD (gastroesophageal reflux disease)   . Gout   . Hemodialysis-associated hypotension   . HLD (hyperlipidemia)   . Hypertension   . IBS (irritable bowel syndrome)   . Insomnia   . Morbid obesity ([place])   . PAF (paroxysmal atrial fibrillation) ([place])   . Peripheral neuropathy ([place])   . PONV (postoperative nausea and vomiting)    . Renal cyst   . Renal insufficiency 10/30/2005   doppler - abn resistance consistent w/ parenchymal disease  . Renovascular hypertension   . Type 2 diabetes mellitus with diabetic neuropathy, with long-term current use of insulin ([place])   . Vertigo     CURRENT MEDICATIONS: Reviewed per MAR/see medication list   Medication List       Accurate as of 09/03/16  2:53 PM. Always use your most recent med list.          acetaminophen 325 MG tablet Commonly known as:  TYLENOL Take 650 mg by mouth every 4 (four) hours as needed for headache (muscle pain).   allopurinol 100 MG tablet Commonly known as:  ZYLOPRIM Take 100 mg by mouth daily.   amiodarone 200 MG tablet Commonly known as:  PACERONE Take 200 mg by mouth daily.   aspirin 325 MG EC tablet Take 325 mg by mouth daily.   atorvastatin 80 MG tablet Commonly known as:  LIPITOR Take 1 tablet (80 mg total) by mouth daily at 6 PM.   cephALEXin 500 MG capsule Commonly known as:  KEFLEX Take 500 mg by mouth. Take 4 tablets PO, one time dose   cephALEXin 500 MG capsule Commonly known as:  KEFLEX Take 500 mg by mouth 3 (three) times daily.   cetirizine 10 MG tablet Commonly  known as:  ZYRTEC Take 5 mg by mouth daily. Take 1/2 tablet to = 5 mg qd   diazepam 5 MG tablet Commonly known as:  VALIUM Take one tablet by mouth every night at bedtime for rest   dicyclomine 20 MG tablet Commonly known as:  BENTYL Take 1 tablet (20 mg total) by mouth 3 (three) times daily.   dorzolamide-timolol 22.3-6.8 MG/ML ophthalmic solution Commonly known as:  COSOPT Place 1 drop into the left eye 2 (two) times daily. Wait 2-5 minutes when giving two eye meds   doxycycline 100 MG tablet Commonly known as:  VIBRA-TABS Take 100 mg by mouth 2 (two) times daily. Take for 5 days   FLONASE ALLERGY RELIEF 50 MCG/ACT nasal spray Generic drug:  fluticasone Place 2 sprays into both nostrils daily.   folic acid 1 MG tablet Commonly known as:   FOLVITE Take 1 mg by mouth daily.   HUMALOG 100 UNIT/ML injection Generic drug:  insulin lispro Inject 5 Units into the skin. Give 5 units SQ for CBG >250   loperamide 2 MG tablet Commonly known as:  IMODIUM A-D Take 2 mg by mouth 3 (three) times daily as needed for diarrhea or loose stools. May take one dose a day and refuse the other two if not needed.   meclizine 12.5 MG tablet Commonly known as:  ANTIVERT Take 12.5 mg by mouth 2 (two) times daily as needed for dizziness.   Melatonin 5 MG Tabs Take 5 mg by mouth at bedtime.   metoprolol tartrate 25 MG tablet Commonly known as:  LOPRESSOR Take 12.5-25 mg by mouth 2 (two) times daily. Take 1 tab qam, take 1/2 tab at 5PM   midodrine 10 MG tablet Commonly known as:  PROAMATINE Take 10 mg by mouth every Monday, Wednesday, and Friday. On dialysis days   NEPHRO-VITE PO Take 1 tablet by mouth at bedtime.   ondansetron 4 MG disintegrating tablet Commonly known as:  ZOFRAN-ODT Take 4 mg by mouth every 6 (six) hours as needed for nausea or vomiting.   oxycodone 5 MG capsule Commonly known as:  OXY-IR Take 5 mg by mouth every 8 (eight) hours.   OXYGEN Inhale 2 L/min into the lungs as needed.   pantoprazole 40 MG tablet Commonly known as:  PROTONIX Take 40 mg by mouth 2 (two) times daily.   UNABLE TO FIND Med Name: Preparation H CRM Aloe.  Apply to external hemorrhoids BID PRN.        Allergies  Allergen Reactions  . Erythromycin Swelling  . Erythromycin Base     Other reaction(s): GI Upset (intolerance)  . Penicillins Other (See Comments)    Unknown allergic reaction.  She has taken keflex many times without problems  . Codeine Rash and Palpitations    Other reaction(s): GI Upset (intolerance)  . Penicillin G Rash   REVIEW OF SYSTEMS:    GENERAL: no fatigue, no weight changes, no fever, chills  RESPIRATORY: no cough, SOB, DOE, wheezing, hemoptysis CARDIAC: no chest pain GI: no abdominal pain, diarrhea,  constipation, heart burn, nausea or vomiting  PHYSICAL EXAMINATION  GENERAL: no acute distress SKIN:  Left side of  Nose.surgical incision with sutures, dry and no redness NECK: supple, trachea midline, no neck masses, no thyroid tenderness, no thyromegaly LYMPHATICS: no LAN in the neck, no supraclavicular LAN RESPIRATORY: breathing is even & unlabored, BS CTAB, O2 @ 2L/min via [place]  CARDIAC: RRR, no murmur,no extra heart sounds, no edema GI: abdomen soft, normal  BS, no masses, no tenderness, no hepatomegaly, no splenomegaly EXTREMITIES: Able to move all 4 extremities; Left upper arm AV fistula + for bruit and thrill PSYCHIATRIC: the patient is alert & oriented X 3, affect & behavior appropriate  LABS/RADIOLOGY: Labs reviewed: Basic Metabolic Panel:  Recent Labs  03/10/16 06/17/16 06/25/16  NA 143 144 142  K 5.0 4.1 4.2  BUN 30* 17 27*  CREATININE 4.7* 2.6* 3.7*   Liver Function Tests:  Recent Labs  12/06/15 03/10/16 06/17/16  AST 33  33 44* 31  ALT 25  25 34 24  ALKPHOS 103  103 153* 107    CBC:  Recent Labs  12/06/15 03/10/16 06/17/16  WBC 4.1  4.1 6.7 8.4  NEUTROABS 1  1 3 4   HGB 9.5*  9.5* 10.9* 8.5*  HCT 32*  32* 36 28*  PLT 125*  125* 156 197     ASSESSMENT/PLAN:   BCE S/P MOHS surgery - continue to keep skin clean and dry, Doxycycline 100 mg PO BID X 5 days; discontinue Keflex  Anemia of chronic disease - check CBC Lab Results  Component Value Date   HGB 8.5 (A) 06/17/2016    Chronic combined systolic and diastolic CHF - no SOB;  continue O@ @ 2L/min via  continuously  IBS - continue Bentyl 20 mg 1 tab by mouth 3 times a day and Imodium AD 2 mg 1 caplet by mouth 3 times a day  Glaucoma - no complaints of eye pain; continue Cosopt eye gtts instill 1 gtt into left eye BID  Diabetes mellitus with renal complication -   CBGs to be done BID with Novolog 5 units SQ BID for CBG > 250 Lab Results  Component Value Date   HGBA1C 5.1 05/22/2016     Paroxysmal atrial fibrillation - rate controlled; continue amiodarone 200 mg 1 tab by mouth daily and metoprolol tartrate 25 mg 1 tab by mouth every morning on T-Th-Sat_Sun AM and 12.5 mg PO Q PM on T-Th-Sat-Sun. and aspirin 325 mg 1 tab by mouth daily  Allergic rhinitis - continue Zyrtec 5 mg 1 tab by mouth daily at bedtime and Flonase 50 g 2 sprays into each nostril daily  Vertigo - continue meclizine 12.5 mg 1 tab by mouth twice a day when necessary  Hemodialysis associated hypotension - continue Midodrin 10 mg 1 tab by mouth every Mondays-Wednesdays-Fridays (dialysis days)  Gout - continue allopurinol 100 mg 1 tab by mouth daily  Hyperlipidemia - continue Lipitor 80 mg 1 tab by mouth daily Lab Results  Component Value Date   CHOL 92 05/22/2016   HDL 35 05/22/2016   LDLCALC 29 05/22/2016   TRIG 142 05/22/2016    Insomnia - continue melatonin 5 mg 1 tab by mouth daily at bedtime  Anxiety - continue Valium 5 mg 1 tab by mouth daily at bedtime  Gastritis - continue Protonix 40 mg 1 tab by mouth twice a day  ESRD - on HD Q M-W-F  Osteoarthrosis - continue oxycodone 5 mg 1 tab by mouth every 8 hours when necessary and acetaminophen 325 mg take 2 tabs = 650 mg by mouth every 4 hours when necessary     Goals of care:  Long-term care    [name] Age, NP [facility] [phone] Patient ID: [name] James, female   DOB: Jul 31, 1936, 80 y.o.   MRN: [id number]

## 2016-09-04 DIAGNOSIS — D649 Anemia, unspecified: Secondary | ICD-10-CM | POA: Diagnosis not present

## 2016-09-04 LAB — CBC AND DIFFERENTIAL
HEMATOCRIT: 43 % (ref 36–46)
HEMOGLOBIN: 12.9 g/dL (ref 12.0–16.0)
Neutrophils Absolute: 4 /uL
Platelets: 179 10*3/uL (ref 150–399)
WBC: 8.6 10*3/mL

## 2016-09-05 DIAGNOSIS — E211 Secondary hyperparathyroidism, not elsewhere classified: Secondary | ICD-10-CM | POA: Diagnosis not present

## 2016-09-05 DIAGNOSIS — N2581 Secondary hyperparathyroidism of renal origin: Secondary | ICD-10-CM | POA: Diagnosis not present

## 2016-09-05 DIAGNOSIS — Z23 Encounter for immunization: Secondary | ICD-10-CM | POA: Diagnosis not present

## 2016-09-05 DIAGNOSIS — N186 End stage renal disease: Secondary | ICD-10-CM | POA: Diagnosis not present

## 2016-09-05 DIAGNOSIS — E1122 Type 2 diabetes mellitus with diabetic chronic kidney disease: Secondary | ICD-10-CM | POA: Diagnosis not present

## 2016-09-05 DIAGNOSIS — E875 Hyperkalemia: Secondary | ICD-10-CM | POA: Diagnosis not present

## 2016-09-08 DIAGNOSIS — E1122 Type 2 diabetes mellitus with diabetic chronic kidney disease: Secondary | ICD-10-CM | POA: Diagnosis not present

## 2016-09-08 DIAGNOSIS — E211 Secondary hyperparathyroidism, not elsewhere classified: Secondary | ICD-10-CM | POA: Diagnosis not present

## 2016-09-08 DIAGNOSIS — N186 End stage renal disease: Secondary | ICD-10-CM | POA: Diagnosis not present

## 2016-09-08 DIAGNOSIS — Z23 Encounter for immunization: Secondary | ICD-10-CM | POA: Diagnosis not present

## 2016-09-08 DIAGNOSIS — N2581 Secondary hyperparathyroidism of renal origin: Secondary | ICD-10-CM | POA: Diagnosis not present

## 2016-09-08 DIAGNOSIS — E875 Hyperkalemia: Secondary | ICD-10-CM | POA: Diagnosis not present

## 2016-09-09 ENCOUNTER — Other Ambulatory Visit: Payer: Self-pay | Admitting: *Deleted

## 2016-09-09 MED ORDER — DIAZEPAM 5 MG PO TABS: ORAL_TABLET | ORAL | 0 refills | Status: DC

## 2016-09-09 NOTE — Telephone Encounter (Signed)
[facility] #[phone] Fax: [phone] 

## 2016-09-10 ENCOUNTER — Telehealth: Payer: Self-pay | Admitting: Internal Medicine

## 2016-09-10 DIAGNOSIS — E1122 Type 2 diabetes mellitus with diabetic chronic kidney disease: Secondary | ICD-10-CM | POA: Diagnosis not present

## 2016-09-10 DIAGNOSIS — E875 Hyperkalemia: Secondary | ICD-10-CM | POA: Diagnosis not present

## 2016-09-10 DIAGNOSIS — E211 Secondary hyperparathyroidism, not elsewhere classified: Secondary | ICD-10-CM | POA: Diagnosis not present

## 2016-09-10 DIAGNOSIS — Z23 Encounter for immunization: Secondary | ICD-10-CM | POA: Diagnosis not present

## 2016-09-10 DIAGNOSIS — N186 End stage renal disease: Secondary | ICD-10-CM | POA: Diagnosis not present

## 2016-09-10 DIAGNOSIS — N2581 Secondary hyperparathyroidism of renal origin: Secondary | ICD-10-CM | POA: Diagnosis not present

## 2016-09-10 NOTE — Telephone Encounter (Signed)
Daughter called and states that her mother had some dark stool and is concerned. States they would like her to have a cologuard test done, state they think she may even consent to a colonoscopy. She scheduled an appt to see Dr. Hilarie Fredrickson 11/25/16 but were hoping to have something done before that date. Dr. Hilarie Fredrickson should the pt come and see an APP sooner for possible colon? Please advise.

## 2016-09-12 DIAGNOSIS — N2581 Secondary hyperparathyroidism of renal origin: Secondary | ICD-10-CM | POA: Diagnosis not present

## 2016-09-12 DIAGNOSIS — E875 Hyperkalemia: Secondary | ICD-10-CM | POA: Diagnosis not present

## 2016-09-12 DIAGNOSIS — Z23 Encounter for immunization: Secondary | ICD-10-CM | POA: Diagnosis not present

## 2016-09-12 DIAGNOSIS — N186 End stage renal disease: Secondary | ICD-10-CM | POA: Diagnosis not present

## 2016-09-12 DIAGNOSIS — E1122 Type 2 diabetes mellitus with diabetic chronic kidney disease: Secondary | ICD-10-CM | POA: Diagnosis not present

## 2016-09-12 DIAGNOSIS — E211 Secondary hyperparathyroidism, not elsewhere classified: Secondary | ICD-10-CM | POA: Diagnosis not present

## 2016-09-12 NOTE — Telephone Encounter (Signed)
I would recommend APP visit

## 2016-09-15 DIAGNOSIS — N2581 Secondary hyperparathyroidism of renal origin: Secondary | ICD-10-CM | POA: Diagnosis not present

## 2016-09-15 DIAGNOSIS — E211 Secondary hyperparathyroidism, not elsewhere classified: Secondary | ICD-10-CM | POA: Diagnosis not present

## 2016-09-15 DIAGNOSIS — Z23 Encounter for immunization: Secondary | ICD-10-CM | POA: Diagnosis not present

## 2016-09-15 DIAGNOSIS — N186 End stage renal disease: Secondary | ICD-10-CM | POA: Diagnosis not present

## 2016-09-15 DIAGNOSIS — E1122 Type 2 diabetes mellitus with diabetic chronic kidney disease: Secondary | ICD-10-CM | POA: Diagnosis not present

## 2016-09-15 DIAGNOSIS — E875 Hyperkalemia: Secondary | ICD-10-CM | POA: Diagnosis not present

## 2016-09-15 NOTE — Telephone Encounter (Signed)
Left a message to call back and schedule her an APP appointment.

## 2016-09-17 DIAGNOSIS — N186 End stage renal disease: Secondary | ICD-10-CM | POA: Diagnosis not present

## 2016-09-17 DIAGNOSIS — Z23 Encounter for immunization: Secondary | ICD-10-CM | POA: Diagnosis not present

## 2016-09-17 DIAGNOSIS — N2581 Secondary hyperparathyroidism of renal origin: Secondary | ICD-10-CM | POA: Diagnosis not present

## 2016-09-17 DIAGNOSIS — E875 Hyperkalemia: Secondary | ICD-10-CM | POA: Diagnosis not present

## 2016-09-17 DIAGNOSIS — E211 Secondary hyperparathyroidism, not elsewhere classified: Secondary | ICD-10-CM | POA: Diagnosis not present

## 2016-09-17 DIAGNOSIS — E1122 Type 2 diabetes mellitus with diabetic chronic kidney disease: Secondary | ICD-10-CM | POA: Diagnosis not present

## 2016-09-19 DIAGNOSIS — E875 Hyperkalemia: Secondary | ICD-10-CM | POA: Diagnosis not present

## 2016-09-19 DIAGNOSIS — N186 End stage renal disease: Secondary | ICD-10-CM | POA: Diagnosis not present

## 2016-09-19 DIAGNOSIS — E1122 Type 2 diabetes mellitus with diabetic chronic kidney disease: Secondary | ICD-10-CM | POA: Diagnosis not present

## 2016-09-19 DIAGNOSIS — E211 Secondary hyperparathyroidism, not elsewhere classified: Secondary | ICD-10-CM | POA: Diagnosis not present

## 2016-09-19 DIAGNOSIS — N2581 Secondary hyperparathyroidism of renal origin: Secondary | ICD-10-CM | POA: Diagnosis not present

## 2016-09-19 DIAGNOSIS — Z23 Encounter for immunization: Secondary | ICD-10-CM | POA: Diagnosis not present

## 2016-09-20 DIAGNOSIS — E1122 Type 2 diabetes mellitus with diabetic chronic kidney disease: Secondary | ICD-10-CM | POA: Diagnosis not present

## 2016-09-20 DIAGNOSIS — Z992 Dependence on renal dialysis: Secondary | ICD-10-CM | POA: Diagnosis not present

## 2016-09-20 DIAGNOSIS — N186 End stage renal disease: Secondary | ICD-10-CM | POA: Diagnosis not present

## 2016-09-22 DIAGNOSIS — D631 Anemia in chronic kidney disease: Secondary | ICD-10-CM | POA: Diagnosis not present

## 2016-09-22 DIAGNOSIS — E211 Secondary hyperparathyroidism, not elsewhere classified: Secondary | ICD-10-CM | POA: Diagnosis not present

## 2016-09-22 DIAGNOSIS — N186 End stage renal disease: Secondary | ICD-10-CM | POA: Diagnosis not present

## 2016-09-23 NOTE — Telephone Encounter (Signed)
Left message to call back and schedule appt with a midlevel.

## 2016-09-24 DIAGNOSIS — D631 Anemia in chronic kidney disease: Secondary | ICD-10-CM | POA: Diagnosis not present

## 2016-09-24 DIAGNOSIS — E211 Secondary hyperparathyroidism, not elsewhere classified: Secondary | ICD-10-CM | POA: Diagnosis not present

## 2016-09-24 DIAGNOSIS — N186 End stage renal disease: Secondary | ICD-10-CM | POA: Diagnosis not present

## 2016-09-26 DIAGNOSIS — D631 Anemia in chronic kidney disease: Secondary | ICD-10-CM | POA: Diagnosis not present

## 2016-09-26 DIAGNOSIS — N186 End stage renal disease: Secondary | ICD-10-CM | POA: Diagnosis not present

## 2016-09-26 DIAGNOSIS — E211 Secondary hyperparathyroidism, not elsewhere classified: Secondary | ICD-10-CM | POA: Diagnosis not present

## 2016-09-29 DIAGNOSIS — E211 Secondary hyperparathyroidism, not elsewhere classified: Secondary | ICD-10-CM | POA: Diagnosis not present

## 2016-09-29 DIAGNOSIS — N186 End stage renal disease: Secondary | ICD-10-CM | POA: Diagnosis not present

## 2016-09-29 DIAGNOSIS — D631 Anemia in chronic kidney disease: Secondary | ICD-10-CM | POA: Diagnosis not present

## 2016-10-01 DIAGNOSIS — N186 End stage renal disease: Secondary | ICD-10-CM | POA: Diagnosis not present

## 2016-10-01 DIAGNOSIS — D631 Anemia in chronic kidney disease: Secondary | ICD-10-CM | POA: Diagnosis not present

## 2016-10-01 DIAGNOSIS — E211 Secondary hyperparathyroidism, not elsewhere classified: Secondary | ICD-10-CM | POA: Diagnosis not present

## 2016-10-03 DIAGNOSIS — N186 End stage renal disease: Secondary | ICD-10-CM | POA: Diagnosis not present

## 2016-10-03 DIAGNOSIS — E211 Secondary hyperparathyroidism, not elsewhere classified: Secondary | ICD-10-CM | POA: Diagnosis not present

## 2016-10-03 DIAGNOSIS — D631 Anemia in chronic kidney disease: Secondary | ICD-10-CM | POA: Diagnosis not present

## 2016-10-06 DIAGNOSIS — E211 Secondary hyperparathyroidism, not elsewhere classified: Secondary | ICD-10-CM | POA: Diagnosis not present

## 2016-10-06 DIAGNOSIS — D631 Anemia in chronic kidney disease: Secondary | ICD-10-CM | POA: Diagnosis not present

## 2016-10-06 DIAGNOSIS — N186 End stage renal disease: Secondary | ICD-10-CM | POA: Diagnosis not present

## 2016-10-07 ENCOUNTER — Encounter: Payer: Self-pay | Admitting: Family

## 2016-10-08 ENCOUNTER — Encounter: Payer: Self-pay | Admitting: Family

## 2016-10-08 ENCOUNTER — Ambulatory Visit (INDEPENDENT_AMBULATORY_CARE_PROVIDER_SITE_OTHER): Payer: Medicare Other | Admitting: Family

## 2016-10-08 ENCOUNTER — Ambulatory Visit (HOSPITAL_COMMUNITY)
Admission: RE | Admit: 2016-10-08 | Discharge: 2016-10-08 | Disposition: A | Discharge status: Home / Self Care | Payer: Medicare Other | Source: Ambulatory Visit | Attending: Family | Admitting: Family

## 2016-10-08 VITALS — BP 121/67 | HR 79 | Temp 97.3°F | Resp 16 | Ht 63.0 in | Wt 160.0 lb

## 2016-10-08 DIAGNOSIS — Z992 Dependence on renal dialysis: Secondary | ICD-10-CM | POA: Insufficient documentation

## 2016-10-08 DIAGNOSIS — I82602 Acute embolism and thrombosis of unspecified veins of left upper extremity: Secondary | ICD-10-CM | POA: Insufficient documentation

## 2016-10-08 DIAGNOSIS — N186 End stage renal disease: Secondary | ICD-10-CM

## 2016-10-08 NOTE — Progress Notes (Signed)
Established Dialysis Access  History of Present Illness  [name] James is a 80 y.o. (1936-10-26) female who is s/p insertion of  left upper arm AV graft and removal of old forearm graft on 04/12/15 by Dr. Scot Dock. She had a previous left radiocephalic fistula which failed. She then had a forearm graft. The brachial vein was fairly small and this clotted fairly early. Therefore Dr. Scot Dock elected to place a new left upper arm graft. She returns today with report of hardened area at center of graft that is limiting area of graft that can be used. Area is painful, occasionally warm to touch.  She dialyzes M-W-F via the left upper arm AV graft. Daughter states that pt has had several dilatations of the left upper arm access by the nephrology group.   Daughter states pt has a hx of GI bleed in December, 2015. She was given IV heparin at that time for atrial fib, pt had rectal bleeding, sees Dr. Hilarie Fredrickson.   She is on a single agent antiplatelet medication and no anticoagulants.   The patient is right hand dominant.   Pt denies steal sx's in either hand/arm, but does state that her left hand stays cold.   Past Medical History:  Diagnosis Date  . Anemia, chronic disease   . Anxiety   . Arthritis    knees  . Atrial fibrillation ([place])   . Barrett esophagus   . Benign paroxysmal positional vertigo   . Breast cancer ([place]) 1980s   bil mastectomies, no radiation or chemo  . Cataract   . Chest pain, atypical 12/05/2008   R/Lmv- normal perfusion all regions, noe ECG changes   . CHF (congestive heart failure) ([place]) 05/13/2011   echo - ER>74%; stage 1 diastolic dysfunction; elevated LV filling pressure, MAC  . CKD (chronic kidney disease), stage III   . Claudication ([place]) 10/30/2005   doppler - normal evaluation, no evidence of aneurysm, diameter reduction, dissection, compression or vascular abnormality)  . Complication of anesthesia   . Dyslipidemia   . Dysrhythmia    Atrial Fibrillation    . Edema   . ESRD (end stage renal disease) ([place])   . Family history of adverse reaction to anesthesia    son also has nausea   . Gastritis and gastroduodenitis   . GERD (gastroesophageal reflux disease)   . Gout   . Hemodialysis-associated hypotension   . HLD (hyperlipidemia)   . Hypertension   . IBS (irritable bowel syndrome)   . Insomnia   . Morbid obesity ([place])   . PAF (paroxysmal atrial fibrillation) ([place])   . Peripheral neuropathy ([place])   . PONV (postoperative nausea and vomiting)   . Renal cyst   . Renal insufficiency 10/30/2005   doppler - abn resistance consistent w/ parenchymal disease  . Renovascular hypertension   . Type 2 diabetes mellitus with diabetic neuropathy, with long-term current use of insulin ([place])   . Vertigo     Social History Social History  Substance Use Topics  . Smoking status: Never Smoker  . Smokeless tobacco: Never Used  . Alcohol use No    Family History Family History  Problem Relation Age of Onset  . Cancer Mother   . Diabetes Mother   . Heart disease Mother   . Heart attack Father   . Stroke Father   . Parkinson's disease Brother   . Cancer Sister     Surgical History Past Surgical History:  Procedure Laterality Date  . ANGIOPLASTY  Left 02/12/2015   Procedure: ANGIOPLASTY;  Surgeon: Angelia Mould, MD;  Location: [facility] CATH LAB;  Service: Cardiovascular;  Laterality: Left;  AVF  . AV FISTULA PLACEMENT Left 11/30/2014   Procedure: ARTERIOVENOUS (AV) FISTULA CREATION LEFT ARM;  Surgeon: Angelia Mould, MD;  Location: [place];  Service: Vascular;  Laterality: Left;  . AV FISTULA PLACEMENT Left 03/20/2015   Procedure:  Inserton of Left Upper Arm Gortex Graft;  Surgeon: Angelia Mould, MD;  Location: [place];  Service: Vascular;  Laterality: Left;  . AV FISTULA PLACEMENT Left 04/12/2015   Procedure: INSERTION OF LEFT ARM  ARTERIOVENOUS GORE-TEX GRAFT ;  Surgeon: Angelia Mould, MD;  Location: [place];  Service:  Vascular;  Laterality: Left;  . BACK SURGERY  ~1980  . CHOLECYSTECTOMY    . ESOPHAGOGASTRODUODENOSCOPY N/A 10/24/2014   Procedure: ESOPHAGOGASTRODUODENOSCOPY (EGD);  Surgeon: Jerene Bears, MD;  Location: [facility] ENDOSCOPY;  Service: Endoscopy;  Laterality: N/A;  . EXCHANGE OF A DIALYSIS CATHETER Left 11/30/2014   Procedure: EXCHANGE OF A DIALYSIS CATHETER, LEFT INTERNAL JUGULAR;  Surgeon: Angelia Mould, MD;  Location: [place];  Service: Vascular;  Laterality: Left;  . EYE SURGERY    . FISTULOGRAM N/A 02/12/2015   Procedure: FISTULOGRAM;  Surgeon: Angelia Mould, MD;  Location: [facility] CATH LAB;  Service: Cardiovascular;  Laterality: N/A;  . HEMORRHOID SURGERY    . LIGATION OF ARTERIOVENOUS  FISTULA Left 03/20/2015   Procedure: LIGATION OF ARTERIOVENOUS  FISTULA;  Surgeon: Angelia Mould, MD;  Location: [place];  Service: Vascular;  Laterality: Left;  Marland Kitchen MASTECTOMY Bilateral ~1982   bil breast reconstruction with implants  . ORIF TIBIA & FIBULA FRACTURES Left 2007   also had left non displaced malleolar fracture.   Marland Kitchen REMOVAL OF GRAFT Left 04/12/2015   Procedure: REMOVAL OF LEFT ARM ARTERIOVENOUS GORE-TEX GRAFT;  Surgeon: Angelia Mould, MD;  Location: [place];  Service: Vascular;  Laterality: Left;    Allergies  Allergen Reactions  . Erythromycin Swelling  . Erythromycin Base     Other reaction(s): GI Upset (intolerance)  . Penicillins Other (See Comments)    Unknown allergic reaction.  She has taken keflex and Rocephin many times without problems  . Codeine Rash and Palpitations    Other reaction(s): GI Upset (intolerance)  . Penicillin G Rash    Current Outpatient Prescriptions  Medication Sig Dispense Refill  . acetaminophen (TYLENOL) 325 MG tablet Take 650 mg by mouth every 4 (four) hours as needed for headache (muscle pain).     Marland Kitchen allopurinol (ZYLOPRIM) 100 MG tablet Take 100 mg by mouth daily.     Marland Kitchen amiodarone (PACERONE) 200 MG tablet Take 200 mg by mouth daily.       Marland Kitchen aspirin 325 MG EC tablet Take 325 mg by mouth daily.    Marland Kitchen atorvastatin (LIPITOR) 80 MG tablet Take 1 tablet (80 mg total) by mouth daily at 6 PM.    . B Complex-C-Folic Acid (NEPHRO-VITE PO) Take 1 tablet by mouth at bedtime.     . cephALEXin (KEFLEX) 500 MG capsule Take 500 mg by mouth. Take 4 tablets PO, one time dose    . cetirizine (ZYRTEC) 10 MG tablet Take 5 mg by mouth daily. Take 1/2 tablet to = 5 mg qd    . diazepam (VALIUM) 5 MG tablet Take one tablet by mouth every night at bedtime for rest 30 tablet 0  . dicyclomine (BENTYL) 20 MG tablet Take 1 tablet (20  mg total) by mouth 3 (three) times daily. 90 tablet 1  . dorzolamide-timolol (COSOPT) 22.3-6.8 MG/ML ophthalmic solution Place 1 drop into the left eye 2 (two) times daily. Wait 2-5 minutes when giving two eye meds    . doxycycline (VIBRA-TABS) 100 MG tablet Take 100 mg by mouth 2 (two) times daily. Take for 5 days    . fluticasone (FLONASE ALLERGY RELIEF) 50 MCG/ACT nasal spray Place 2 sprays into both nostrils daily.    . folic acid (FOLVITE) 1 MG tablet Take 1 mg by mouth daily.    . insulin lispro (HUMALOG) 100 UNIT/ML injection Inject 5 Units into the skin. Give 5 units SQ for CBG >250    . loperamide (IMODIUM A-D) 2 MG tablet Take 2 mg by mouth 3 (three) times daily as needed for diarrhea or loose stools. May take one dose a day and refuse the other two if not needed.    . meclizine (ANTIVERT) 12.5 MG tablet Take 12.5 mg by mouth 2 (two) times daily as needed for dizziness.     . Melatonin 5 MG TABS Take 5 mg by mouth at bedtime.    . metoprolol tartrate (LOPRESSOR) 25 MG tablet Take 12.5-25 mg by mouth 2 (two) times daily. Take 1 tab qam, take 1/2 tab at 5PM    . midodrine (PROAMATINE) 10 MG tablet Take 10 mg by mouth every Monday, Wednesday, and Friday. On dialysis days    . ondansetron (ZOFRAN-ODT) 4 MG disintegrating tablet Take 4 mg by mouth every 6 (six) hours as needed for nausea or vomiting.     Marland Kitchen oxycodone (OXY-IR) 5  MG capsule Take 5 mg by mouth every 8 (eight) hours.    . OXYGEN Inhale 2 L/min into the lungs as needed.     . pantoprazole (PROTONIX) 40 MG tablet Take 40 mg by mouth 2 (two) times daily.     Marland Kitchen UNABLE TO FIND Med Name: Preparation H CRM Aloe.  Apply to external hemorrhoids BID PRN.     No current facility-administered medications for this visit.      REVIEW OF SYSTEMS: see HPI for pertinent positives and negatives    PHYSICAL EXAMINATION:  Vitals:   10/08/16 0931  BP: 121/67  Pulse: 79  Resp: 16  Temp: 97.3 F (36.3 C)  TempSrc: Oral  SpO2: 100%  Weight: 160 lb (72.6 kg)  Height: 5\' 3"  (1.6 m)   Body mass index is 28.34 kg/m.  General: The patient appears their stated age, seated in w/c, mild pallor  HEENT:  No gross abnormalities Pulmonary: Respirations are non-labored, 2L/[place] in use Abdomen: Soft and non-tender with normal pitched bowel sounds.. Musculoskeletal: There are no major deformities.   Neurologic: No focal weakness or paresthesias are detected. Right hand grip strength is 4/5, left is 5/5.  Skin: There are no ulcers or rashes noted. Psychiatric: The patient has normal affect. Cardiovascular: There is a regular rate and rhythm without significant murmur appreciated. Bilateral radial and ulnar pulses are not palpable. Left upper arm AV graft thrill is palpable. There is no thinning of skin over graft noted. There are no open wounds at the graft site. Two moderately sclerosed areas palpated over graft that are the access sites. Ecchymosis over and surrounding graft, moderate swelling adjacent to graft.    Medical Decision Making  [name] James is a 80 y.o. female who is s/p insertion of  left upper arm AV graft and Removal of old forearm graft on 04/12/15 by  Dr. Scot Dock. She returns today with report of hardened area at center of graft that is limiting area of graft that can be used. Area is painful, occasionally warm to touch.   Dr. Scot Dock reviewed results of  dialysis access duplex performed today.  Mild dilatation is noted in the mid segment, stenosis is not significant.  Advise that the two areas being accessed now be avoided, access other areas of the graft. If pt continues to have problems with the graft, she will need to make an appointment with Dr. Scot Dock to discuss creation of another access site.   Ephraim Reichel, Sharmon Leyden, RN, MSN, FNP-C [facility] Office: [phone]  10/08/2016, 9:59 AM  Clinic MD: Scot Dock

## 2016-10-09 DIAGNOSIS — E211 Secondary hyperparathyroidism, not elsewhere classified: Secondary | ICD-10-CM | POA: Diagnosis not present

## 2016-10-09 DIAGNOSIS — D631 Anemia in chronic kidney disease: Secondary | ICD-10-CM | POA: Diagnosis not present

## 2016-10-09 DIAGNOSIS — N186 End stage renal disease: Secondary | ICD-10-CM | POA: Diagnosis not present

## 2016-10-10 DIAGNOSIS — N186 End stage renal disease: Secondary | ICD-10-CM | POA: Diagnosis not present

## 2016-10-10 DIAGNOSIS — D631 Anemia in chronic kidney disease: Secondary | ICD-10-CM | POA: Diagnosis not present

## 2016-10-10 DIAGNOSIS — E211 Secondary hyperparathyroidism, not elsewhere classified: Secondary | ICD-10-CM | POA: Diagnosis not present

## 2016-10-13 DIAGNOSIS — N186 End stage renal disease: Secondary | ICD-10-CM | POA: Diagnosis not present

## 2016-10-13 DIAGNOSIS — D631 Anemia in chronic kidney disease: Secondary | ICD-10-CM | POA: Diagnosis not present

## 2016-10-13 DIAGNOSIS — E211 Secondary hyperparathyroidism, not elsewhere classified: Secondary | ICD-10-CM | POA: Diagnosis not present

## 2016-10-15 DIAGNOSIS — E1122 Type 2 diabetes mellitus with diabetic chronic kidney disease: Secondary | ICD-10-CM | POA: Diagnosis not present

## 2016-10-15 DIAGNOSIS — E211 Secondary hyperparathyroidism, not elsewhere classified: Secondary | ICD-10-CM | POA: Diagnosis not present

## 2016-10-15 DIAGNOSIS — N186 End stage renal disease: Secondary | ICD-10-CM | POA: Diagnosis not present

## 2016-10-15 DIAGNOSIS — D631 Anemia in chronic kidney disease: Secondary | ICD-10-CM | POA: Diagnosis not present

## 2016-10-17 ENCOUNTER — Non-Acute Institutional Stay (SKILLED_NURSING_FACILITY): Payer: Medicare Other | Admitting: Internal Medicine

## 2016-10-17 ENCOUNTER — Encounter: Payer: Self-pay | Admitting: Internal Medicine

## 2016-10-17 DIAGNOSIS — K2970 Gastritis, unspecified, without bleeding: Secondary | ICD-10-CM | POA: Diagnosis not present

## 2016-10-17 DIAGNOSIS — D631 Anemia in chronic kidney disease: Secondary | ICD-10-CM | POA: Diagnosis not present

## 2016-10-17 DIAGNOSIS — N186 End stage renal disease: Secondary | ICD-10-CM | POA: Diagnosis not present

## 2016-10-17 DIAGNOSIS — Z79899 Other long term (current) drug therapy: Secondary | ICD-10-CM | POA: Diagnosis not present

## 2016-10-17 DIAGNOSIS — E1140 Type 2 diabetes mellitus with diabetic neuropathy, unspecified: Secondary | ICD-10-CM

## 2016-10-17 DIAGNOSIS — Z992 Dependence on renal dialysis: Secondary | ICD-10-CM | POA: Diagnosis not present

## 2016-10-17 DIAGNOSIS — I5042 Chronic combined systolic (congestive) and diastolic (congestive) heart failure: Secondary | ICD-10-CM | POA: Diagnosis not present

## 2016-10-17 DIAGNOSIS — K2990 Gastroduodenitis, unspecified, without bleeding: Secondary | ICD-10-CM

## 2016-10-17 DIAGNOSIS — Z794 Long term (current) use of insulin: Secondary | ICD-10-CM | POA: Diagnosis not present

## 2016-10-17 DIAGNOSIS — E211 Secondary hyperparathyroidism, not elsewhere classified: Secondary | ICD-10-CM | POA: Diagnosis not present

## 2016-10-17 DIAGNOSIS — Z872 Personal history of diseases of the skin and subcutaneous tissue: Secondary | ICD-10-CM | POA: Diagnosis not present

## 2016-10-17 DIAGNOSIS — I953 Hypotension of hemodialysis: Secondary | ICD-10-CM | POA: Diagnosis not present

## 2016-10-17 DIAGNOSIS — I480 Paroxysmal atrial fibrillation: Secondary | ICD-10-CM | POA: Diagnosis not present

## 2016-10-17 DIAGNOSIS — F411 Generalized anxiety disorder: Secondary | ICD-10-CM | POA: Diagnosis not present

## 2016-10-17 HISTORY — DX: Other long term (current) drug therapy: Z79.899

## 2016-10-17 NOTE — Progress Notes (Signed)
Patient ID: [name] James, female   DOB: Jan 24, 1936, 80 y.o.   MRN: [id number]     [place]  PCP: [name], MD  Code Status: Full Code   Allergies  Allergen Reactions  . Erythromycin Swelling  . Erythromycin Base     Other reaction(s): GI Upset (intolerance)  . Penicillins Other (See Comments)    Unknown allergic reaction.  She has taken keflex and Rocephin many times without problems  . Codeine Rash and Palpitations    Other reaction(s): GI Upset (intolerance)  . Penicillin G Rash    Chief Complaint  Patient presents with  . Medical Management of Chronic Issues    Routine Visit     HPI:  80 year old patient is seen today for routine visit. She has been at her baseline. She receives hemodialysis 3 days a week. Her Hb has been stable. Her heartburn is stable. bp reading is good on review. Denies any pain this visit.   Review of Systems:  Constitutional: Negative for fever.  HENT: Negative for headache, congestion, nasal discharge Respiratory: Negative for cough, shortness of breath and wheezing.   Cardiovascular: Negative for chest pain, palpitations, leg swelling.  Gastrointestinal: Negative for heartburn, vomiting, abdominal pain. Has occasional nausea. Musculoskeletal: Negative for fall. Has occasional leg and foot pain.. Skin: Negative for itching, rash.  Neurological: Negative for dizziness. Has numbness to her feet and leg Psychiatric/Behavioral: Negative for depression.    Past Medical History:  Diagnosis Date  . Anemia, chronic disease   . Anxiety   . Arthritis    knees  . Atrial fibrillation ([place])   . Barrett esophagus   . Benign paroxysmal positional vertigo   . Breast cancer ([place]) 1980s   bil mastectomies, no radiation or chemo  . Cataract   . Chest pain, atypical 12/05/2008   R/Lmv- normal perfusion all regions, noe ECG changes   . CHF (congestive heart failure) ([place]) 05/13/2011   echo - WI>09%; stage 1 diastolic  dysfunction; elevated LV filling pressure, MAC  . CKD (chronic kidney disease), stage III   . Claudication ([place]) 10/30/2005   doppler - normal evaluation, no evidence of aneurysm, diameter reduction, dissection, compression or vascular abnormality)  . Complication of anesthesia   . Dyslipidemia   . Dysrhythmia    Atrial Fibrillation  . Edema   . ESRD (end stage renal disease) ([place])   . Family history of adverse reaction to anesthesia    son also has nausea   . Gastritis and gastroduodenitis   . GERD (gastroesophageal reflux disease)   . Gout   . Hemodialysis-associated hypotension   . HLD (hyperlipidemia)   . Hypertension   . IBS (irritable bowel syndrome)   . Insomnia   . Morbid obesity ([place])   . PAF (paroxysmal atrial fibrillation) ([place])   . Peripheral neuropathy ([place])   . PONV (postoperative nausea and vomiting)   . Renal cyst   . Renal insufficiency 10/30/2005   doppler - abn resistance consistent w/ parenchymal disease  . Renovascular hypertension   . Type 2 diabetes mellitus with diabetic neuropathy, with long-term current use of insulin ([place])   . Vertigo    Past Surgical History:  Procedure Laterality Date  . ANGIOPLASTY Left 02/12/2015   Procedure: ANGIOPLASTY;  Surgeon: Angelia Mould, MD;  Location: [facility] CATH LAB;  Service: Cardiovascular;  Laterality: Left;  AVF  . AV FISTULA PLACEMENT Left 11/30/2014   Procedure: ARTERIOVENOUS (AV) FISTULA CREATION LEFT ARM;  Surgeon: Angelia Mould, MD;  Location: [place];  Service: Vascular;  Laterality: Left;  . AV FISTULA PLACEMENT Left 03/20/2015   Procedure:  Inserton of Left Upper Arm Gortex Graft;  Surgeon: Angelia Mould, MD;  Location: [place];  Service: Vascular;  Laterality: Left;  . AV FISTULA PLACEMENT Left 04/12/2015   Procedure: INSERTION OF LEFT ARM  ARTERIOVENOUS GORE-TEX GRAFT ;  Surgeon: Angelia Mould, MD;  Location: [place];  Service: Vascular;  Laterality: Left;  . BACK SURGERY  ~1980  .  CHOLECYSTECTOMY    . ESOPHAGOGASTRODUODENOSCOPY N/A 10/24/2014   Procedure: ESOPHAGOGASTRODUODENOSCOPY (EGD);  Surgeon: Jerene Bears, MD;  Location: [facility] ENDOSCOPY;  Service: Endoscopy;  Laterality: N/A;  . EXCHANGE OF A DIALYSIS CATHETER Left 11/30/2014   Procedure: EXCHANGE OF A DIALYSIS CATHETER, LEFT INTERNAL JUGULAR;  Surgeon: Angelia Mould, MD;  Location: [place];  Service: Vascular;  Laterality: Left;  . EYE SURGERY    . FISTULOGRAM N/A 02/12/2015   Procedure: FISTULOGRAM;  Surgeon: Angelia Mould, MD;  Location: [facility] CATH LAB;  Service: Cardiovascular;  Laterality: N/A;  . HEMORRHOID SURGERY    . LIGATION OF ARTERIOVENOUS  FISTULA Left 03/20/2015   Procedure: LIGATION OF ARTERIOVENOUS  FISTULA;  Surgeon: Angelia Mould, MD;  Location: [place];  Service: Vascular;  Laterality: Left;  Marland Kitchen MASTECTOMY Bilateral ~1982   bil breast reconstruction with implants  . ORIF TIBIA & FIBULA FRACTURES Left 2007   also had left non displaced malleolar fracture.   Marland Kitchen REMOVAL OF GRAFT Left 04/12/2015   Procedure: REMOVAL OF LEFT ARM ARTERIOVENOUS GORE-TEX GRAFT;  Surgeon: Angelia Mould, MD;  Location: [place];  Service: Vascular;  Laterality: Left;   Social History:   reports that she has never smoked. She has never used smokeless tobacco. She reports that she does not drink alcohol or use drugs.  Family History  Problem Relation Age of Onset  . Cancer Mother   . Diabetes Mother   . Heart disease Mother   . Heart attack Father   . Stroke Father   . Parkinson's disease Brother   . Cancer Sister     Medications: Patient's Medications  New Prescriptions   No medications on file  Previous Medications   ACETAMINOPHEN (TYLENOL) 325 MG TABLET    Take 650 mg by mouth every 4 (four) hours as needed for headache (muscle pain).    ALLOPURINOL (ZYLOPRIM) 100 MG TABLET    Take 100 mg by mouth daily.    AMIODARONE (PACERONE) 200 MG TABLET    Take 200 mg by mouth daily.    ASPIRIN 325 MG  EC TABLET    Take 325 mg by mouth daily.   ATORVASTATIN (LIPITOR) 80 MG TABLET    Take 1 tablet (80 mg total) by mouth daily at 6 PM.   B COMPLEX-C-FOLIC ACID (NEPHRO-VITE PO)    Take 1 tablet by mouth at bedtime.    CEPHALEXIN (KEFLEX) 500 MG CAPSULE    Take 500 mg by mouth. Take 4 tablets PO, one time dose   CETIRIZINE (ZYRTEC) 10 MG TABLET    Take 5 mg by mouth daily. Take 1/2 tablet to = 5 mg qd   DIAZEPAM (VALIUM) 5 MG TABLET    Take one tablet by mouth every night at bedtime for rest   DICYCLOMINE (BENTYL) 20 MG TABLET    Take 1 tablet (20 mg total) by mouth 3 (three) times daily.   DORZOLAMIDE-TIMOLOL (COSOPT) 22.3-6.8 MG/ML OPHTHALMIC  SOLUTION    Place 1 drop into the left eye 2 (two) times daily. Wait 2-5 minutes when giving two eye meds   FLUTICASONE (FLONASE ALLERGY RELIEF) 50 MCG/ACT NASAL SPRAY    Place 2 sprays into both nostrils daily.   FOLIC ACID (FOLVITE) 1 MG TABLET    Take 1 mg by mouth daily.   INSULIN LISPRO (HUMALOG) 100 UNIT/ML INJECTION    Inject 5 Units into the skin. Give 5 units SQ for CBG >250   LOPERAMIDE (IMODIUM A-D) 2 MG TABLET    Take 2 mg by mouth 3 (three) times daily as needed for diarrhea or loose stools. May take one dose a day and refuse the other two if not needed.   MECLIZINE (ANTIVERT) 12.5 MG TABLET    Take 12.5 mg by mouth 2 (two) times daily as needed for dizziness.    MELATONIN 5 MG TABS    Take 5 mg by mouth at bedtime.   METOPROLOL TARTRATE (LOPRESSOR) 25 MG TABLET    Take 12.5-25 mg by mouth 2 (two) times daily. Take 1 tab qam, take 1/2 tab at 5PM   MIDODRINE (PROAMATINE) 10 MG TABLET    Take 10 mg by mouth every Monday, Wednesday, and Friday. On dialysis days   OXYCODONE (OXY-IR) 5 MG CAPSULE    Take 5 mg by mouth every 8 (eight) hours as needed.    OXYGEN    Inhale 2 L/min into the lungs as needed.    PANTOPRAZOLE (PROTONIX) 40 MG TABLET    Take 40 mg by mouth 2 (two) times daily.    UNABLE TO FIND    Med Name: Preparation H CRM Aloe.  Apply to  external hemorrhoids BID PRN.  Modified Medications   No medications on file  Discontinued Medications   DOXYCYCLINE (VIBRA-TABS) 100 MG TABLET    Take 100 mg by mouth 2 (two) times daily. Take for 5 days   ONDANSETRON (ZOFRAN-ODT) 4 MG DISINTEGRATING TABLET    Take 4 mg by mouth every 6 (six) hours as needed for nausea or vomiting.      Physical Exam: Vitals:   10/17/16 0918  BP: 116/74  Pulse: 64  Resp: 18  Temp: 97.8 F (36.6 C)  TempSrc: Oral  SpO2: 99%  Weight: 160 lb (72.6 kg)  Height: 5\' 3"  (1.6 m)   Body mass index is 28.34 kg/m.   Wt Readings from Last 3 Encounters:  10/17/16 160 lb (72.6 kg)  10/08/16 160 lb (72.6 kg)  09/03/16 162 lb 12.8 oz (73.8 kg)   BP Readings from Last 3 Encounters:  10/17/16 116/74  10/08/16 121/67  09/03/16 134/76    General- elderly overweight female, in no acute distress Head- normocephalic, atraumatic Eyes- no pallor, no icterus, no discharge, normal conjunctiva, normal sclera Neck- no cervical lymphadenopathy Cardiovascular- normal s1,s2, no murmur Respiratory- bilateral clear to auscultation, no wheeze, no rhonchi, no crackles, no use of accessory muscles Abdomen- bowel sounds present, soft, non tender Musculoskeletal- able to move all 4 extremities, poor lower extremity strength left > right, trace leg edema Neurological- alert and oriented to person, place and time Skin- warm and dry, preventative dressing to her sacrum Psychiatry- normal mood and affect    Labs reviewed: Basic Metabolic Panel:  Recent Labs  03/10/16 06/17/16 06/25/16  NA 143 144 142  K 5.0 4.1 4.2  BUN 30* 17 27*  CREATININE 4.7* 2.6* 3.7*   Liver Function Tests:  Recent Labs  12/06/15 03/10/16 06/17/16  AST 33  33 44* 31  ALT 25  25 34 24  ALKPHOS 103  103 153* 107   No results for input(s): LIPASE, AMYLASE in the last 8760 hours. No results for input(s): AMMONIA in the last 8760 hours. CBC:  Recent Labs  03/10/16 06/17/16  09/04/16  WBC 6.7 8.4 8.6  NEUTROABS 3 4 4   HGB 10.9* 8.5* 12.9  HCT 36 28* 43  PLT 156 197 179    Lab Results  Component Value Date   HGBA1C 5.1 05/22/2016   Lab Results  Component Value Date   TSH 0.88 05/06/2016     Assessment/Plan  Gastritis and duodenitis Stable symptom, attempt to decrease protonix to 40 mg daily from bid and monitor  Long term use of amiodarone For her afib, check tsh to rule out thyrotoxicity  afib Rate controlled. Continue metoprolol 25 mg am and 12.5 mg pm with amiodarone and aspirin. Monitor  ESRD Continue HD 3 days a week for now  GAD Continue valium 5 mg qhs  CHF Appears euvolemic. On o2, breathing stable. Currently not on any bronchodilator treatment. No recent exacerbation. Monitor clinically. Continue lopressor  Dm type 2 with neuropathy Diet controlled. Pain under control. D/c oxyIR and start tramadol 50 mg q8h prn pain and monitor. Check a1c. On prn SSI.   Hypotension with dialysis Stable, continue midodrine on dialysis days  History of pressure ulcer Healed at present, on preventative dressing to sacrum. Continue to monitor. Continue gel overlay mattress.   LAS- TSH, LIPID PANEL, A1C     [name], MD Internal Medicine [facility] [address] [place], [place] [address] Cell Phone (Monday-Friday 8 am - 5 pm): [phone] On Call: [phone] and follow prompts after 5 pm and on weekends Office Phone: [phone] Office Fax: [phone]

## 2016-10-20 DIAGNOSIS — E211 Secondary hyperparathyroidism, not elsewhere classified: Secondary | ICD-10-CM | POA: Diagnosis not present

## 2016-10-20 DIAGNOSIS — N186 End stage renal disease: Secondary | ICD-10-CM | POA: Diagnosis not present

## 2016-10-20 DIAGNOSIS — D631 Anemia in chronic kidney disease: Secondary | ICD-10-CM | POA: Diagnosis not present

## 2016-10-21 DIAGNOSIS — N186 End stage renal disease: Secondary | ICD-10-CM | POA: Diagnosis not present

## 2016-10-21 DIAGNOSIS — E1122 Type 2 diabetes mellitus with diabetic chronic kidney disease: Secondary | ICD-10-CM | POA: Diagnosis not present

## 2016-10-21 DIAGNOSIS — Z992 Dependence on renal dialysis: Secondary | ICD-10-CM | POA: Diagnosis not present

## 2016-10-22 DIAGNOSIS — D631 Anemia in chronic kidney disease: Secondary | ICD-10-CM | POA: Diagnosis not present

## 2016-10-22 DIAGNOSIS — D509 Iron deficiency anemia, unspecified: Secondary | ICD-10-CM | POA: Diagnosis not present

## 2016-10-22 DIAGNOSIS — E1122 Type 2 diabetes mellitus with diabetic chronic kidney disease: Secondary | ICD-10-CM | POA: Diagnosis not present

## 2016-10-22 DIAGNOSIS — N2581 Secondary hyperparathyroidism of renal origin: Secondary | ICD-10-CM | POA: Diagnosis not present

## 2016-10-22 DIAGNOSIS — D649 Anemia, unspecified: Secondary | ICD-10-CM | POA: Diagnosis not present

## 2016-10-22 DIAGNOSIS — N186 End stage renal disease: Secondary | ICD-10-CM | POA: Diagnosis not present

## 2016-10-22 DIAGNOSIS — E039 Hypothyroidism, unspecified: Secondary | ICD-10-CM | POA: Diagnosis not present

## 2016-10-22 DIAGNOSIS — E119 Type 2 diabetes mellitus without complications: Secondary | ICD-10-CM | POA: Diagnosis not present

## 2016-10-22 LAB — LIPID PANEL
Cholesterol: 110 mg/dL (ref 0–200)
HDL: 29 mg/dL — AB (ref 35–70)
LDL Cholesterol: 47 mg/dL
Triglycerides: 169 mg/dL — AB (ref 40–160)

## 2016-10-22 LAB — TSH: TSH: 2.58 u[IU]/mL (ref 0.41–5.90)

## 2016-10-22 LAB — HEMOGLOBIN A1C: Hemoglobin A1C: 5.5

## 2016-10-24 DIAGNOSIS — N186 End stage renal disease: Secondary | ICD-10-CM | POA: Diagnosis not present

## 2016-10-24 DIAGNOSIS — E1122 Type 2 diabetes mellitus with diabetic chronic kidney disease: Secondary | ICD-10-CM | POA: Diagnosis not present

## 2016-10-24 DIAGNOSIS — D509 Iron deficiency anemia, unspecified: Secondary | ICD-10-CM | POA: Diagnosis not present

## 2016-10-24 DIAGNOSIS — D631 Anemia in chronic kidney disease: Secondary | ICD-10-CM | POA: Diagnosis not present

## 2016-10-24 DIAGNOSIS — N2581 Secondary hyperparathyroidism of renal origin: Secondary | ICD-10-CM | POA: Diagnosis not present

## 2016-10-27 DIAGNOSIS — N186 End stage renal disease: Secondary | ICD-10-CM | POA: Diagnosis not present

## 2016-10-27 DIAGNOSIS — D631 Anemia in chronic kidney disease: Secondary | ICD-10-CM | POA: Diagnosis not present

## 2016-10-27 DIAGNOSIS — E1122 Type 2 diabetes mellitus with diabetic chronic kidney disease: Secondary | ICD-10-CM | POA: Diagnosis not present

## 2016-10-27 DIAGNOSIS — D509 Iron deficiency anemia, unspecified: Secondary | ICD-10-CM | POA: Diagnosis not present

## 2016-10-27 DIAGNOSIS — N2581 Secondary hyperparathyroidism of renal origin: Secondary | ICD-10-CM | POA: Diagnosis not present

## 2016-10-29 ENCOUNTER — Ambulatory Visit: Payer: Medicare Other | Admitting: Vascular Surgery

## 2016-10-29 DIAGNOSIS — E1122 Type 2 diabetes mellitus with diabetic chronic kidney disease: Secondary | ICD-10-CM | POA: Diagnosis not present

## 2016-10-29 DIAGNOSIS — D631 Anemia in chronic kidney disease: Secondary | ICD-10-CM | POA: Diagnosis not present

## 2016-10-29 DIAGNOSIS — N2581 Secondary hyperparathyroidism of renal origin: Secondary | ICD-10-CM | POA: Diagnosis not present

## 2016-10-29 DIAGNOSIS — N186 End stage renal disease: Secondary | ICD-10-CM | POA: Diagnosis not present

## 2016-10-29 DIAGNOSIS — D509 Iron deficiency anemia, unspecified: Secondary | ICD-10-CM | POA: Diagnosis not present

## 2016-10-31 DIAGNOSIS — N2581 Secondary hyperparathyroidism of renal origin: Secondary | ICD-10-CM | POA: Diagnosis not present

## 2016-10-31 DIAGNOSIS — D509 Iron deficiency anemia, unspecified: Secondary | ICD-10-CM | POA: Diagnosis not present

## 2016-10-31 DIAGNOSIS — E1122 Type 2 diabetes mellitus with diabetic chronic kidney disease: Secondary | ICD-10-CM | POA: Diagnosis not present

## 2016-10-31 DIAGNOSIS — N186 End stage renal disease: Secondary | ICD-10-CM | POA: Diagnosis not present

## 2016-10-31 DIAGNOSIS — D631 Anemia in chronic kidney disease: Secondary | ICD-10-CM | POA: Diagnosis not present

## 2016-11-03 DIAGNOSIS — N186 End stage renal disease: Secondary | ICD-10-CM | POA: Diagnosis not present

## 2016-11-03 DIAGNOSIS — N2581 Secondary hyperparathyroidism of renal origin: Secondary | ICD-10-CM | POA: Diagnosis not present

## 2016-11-03 DIAGNOSIS — E1122 Type 2 diabetes mellitus with diabetic chronic kidney disease: Secondary | ICD-10-CM | POA: Diagnosis not present

## 2016-11-03 DIAGNOSIS — D631 Anemia in chronic kidney disease: Secondary | ICD-10-CM | POA: Diagnosis not present

## 2016-11-03 DIAGNOSIS — D509 Iron deficiency anemia, unspecified: Secondary | ICD-10-CM | POA: Diagnosis not present

## 2016-11-04 ENCOUNTER — Non-Acute Institutional Stay (SKILLED_NURSING_FACILITY): Payer: Medicare Other | Admitting: Internal Medicine

## 2016-11-04 ENCOUNTER — Encounter: Payer: Self-pay | Admitting: Internal Medicine

## 2016-11-04 DIAGNOSIS — Z794 Long term (current) use of insulin: Secondary | ICD-10-CM | POA: Diagnosis not present

## 2016-11-04 DIAGNOSIS — N184 Chronic kidney disease, stage 4 (severe): Secondary | ICD-10-CM | POA: Diagnosis not present

## 2016-11-04 DIAGNOSIS — E1122 Type 2 diabetes mellitus with diabetic chronic kidney disease: Secondary | ICD-10-CM | POA: Diagnosis not present

## 2016-11-04 NOTE — Progress Notes (Signed)
Patient ID: [name] James, female   DOB: [birth year], 80 y.o.   MRN: [id number]     [place]  PCP: [name], MD  Code Status: Full Code   Allergies  Allergen Reactions  . Erythromycin Swelling  . Erythromycin Base     Other reaction(s): GI Upset (intolerance)  . Penicillins Other (See Comments)    Unknown allergic reaction.  She has taken keflex and Rocephin many times without problems  . Codeine Rash and Palpitations    Other reaction(s): GI Upset (intolerance)  . Penicillin G Rash    Chief Complaint  Patient presents with  . Acute Visit    Blood sugar review     HPI:  80 year old patient is seen today for acute visit. She is concerned about her blood sugar reading being high and would like SSI for coverage. On review of her chart, cbg reading 111-177 with one reading of 214. a1c reviewed 5.5. She denies any other complaint.   Review of Systems:  Constitutional: Negative for fever.  HENT: Negative for headache Respiratory: Negative for shortness of breath    Cardiovascular: Negative for chest pain, palpitations Gastrointestinal: Negative for heartburn, vomiting, abdominal pain.  Musculoskeletal: Negative for fall.  Neurological: she has numbness to her feet and leg Psychiatric/Behavioral: Negative for depression.    Past Medical History:  Diagnosis Date  . Anemia, chronic disease   . Anxiety   . Arthritis    knees  . Atrial fibrillation ([place])   . Barrett esophagus   . Benign paroxysmal positional vertigo   . Breast cancer ([place]) 1980s   bil mastectomies, no radiation or chemo  . Cataract   . Chest pain, atypical 12/05/2008   R/Lmv- normal perfusion all regions, noe ECG changes   . CHF (congestive heart failure) ([place]) 05/13/2011   echo - GE>95%; stage 1 diastolic dysfunction; elevated LV filling pressure, MAC  . CKD (chronic kidney disease), stage III   . Claudication ([place]) 10/30/2005   doppler - normal evaluation, no evidence of  aneurysm, diameter reduction, dissection, compression or vascular abnormality)  . Complication of anesthesia   . Dyslipidemia   . Dysrhythmia    Atrial Fibrillation  . Edema   . ESRD (end stage renal disease) ([place])   . Family history of adverse reaction to anesthesia    son also has nausea   . Gastritis and gastroduodenitis   . GERD (gastroesophageal reflux disease)   . Gout   . Hemodialysis-associated hypotension   . HLD (hyperlipidemia)   . Hypertension   . IBS (irritable bowel syndrome)   . Insomnia   . Morbid obesity ([place])   . PAF (paroxysmal atrial fibrillation) ([place])   . Peripheral neuropathy ([place])   . PONV (postoperative nausea and vomiting)   . Renal cyst   . Renal insufficiency 10/30/2005   doppler - abn resistance consistent w/ parenchymal disease  . Renovascular hypertension   . Type 2 diabetes mellitus with diabetic neuropathy, with long-term current use of insulin ([place])   . Vertigo    Past Surgical History:  Procedure Laterality Date  . ANGIOPLASTY Left 02/12/2015   Procedure: ANGIOPLASTY;  Surgeon: Angelia Mould, MD;  [facility]: [facility] CATH LAB;  Service: Cardiovascular;  Laterality: Left;  AVF  . AV FISTULA PLACEMENT Left 11/30/2014   Procedure: ARTERIOVENOUS (AV) FISTULA CREATION LEFT ARM;  Surgeon: Angelia Mould, MD;  Location: [place];  Service: Vascular;  Laterality: Left;  . AV FISTULA PLACEMENT Left  03/20/2015   Procedure:  Inserton of Left Upper Arm Gortex Graft;  Surgeon: Angelia Mould, MD;  Location: [place];  Service: Vascular;  Laterality: Left;  . AV FISTULA PLACEMENT Left 04/12/2015   Procedure: INSERTION OF LEFT ARM  ARTERIOVENOUS GORE-TEX GRAFT ;  Surgeon: Angelia Mould, MD;  Location: [place];  Service: Vascular;  Laterality: Left;  . BACK SURGERY  ~1980  . CHOLECYSTECTOMY    . ESOPHAGOGASTRODUODENOSCOPY N/A 10/24/2014   Procedure: ESOPHAGOGASTRODUODENOSCOPY (EGD);  Surgeon: Jerene Bears, MD;  Location: [facility] ENDOSCOPY;  Service:  Endoscopy;  Laterality: N/A;  . EXCHANGE OF A DIALYSIS CATHETER Left 11/30/2014   Procedure: EXCHANGE OF A DIALYSIS CATHETER, LEFT INTERNAL JUGULAR;  Surgeon: Angelia Mould, MD;  Location: [place];  Service: Vascular;  Laterality: Left;  . EYE SURGERY    . FISTULOGRAM N/A 02/12/2015   Procedure: FISTULOGRAM;  Surgeon: Angelia Mould, MD;  Location: [facility] CATH LAB;  Service: Cardiovascular;  Laterality: N/A;  . HEMORRHOID SURGERY    . LIGATION OF ARTERIOVENOUS  FISTULA Left 03/20/2015   Procedure: LIGATION OF ARTERIOVENOUS  FISTULA;  Surgeon: Angelia Mould, MD;  Location: [place];  Service: Vascular;  Laterality: Left;  Marland Kitchen MASTECTOMY Bilateral ~1982   bil breast reconstruction with implants  . ORIF TIBIA & FIBULA FRACTURES Left 2007   also had left non displaced malleolar fracture.   Marland Kitchen REMOVAL OF GRAFT Left 04/12/2015   Procedure: REMOVAL OF LEFT ARM ARTERIOVENOUS GORE-TEX GRAFT;  Surgeon: Angelia Mould, MD;  Location: [place];  Service: Vascular;  Laterality: Left;   Social History:   reports that she has never smoked. She has never used smokeless tobacco. She reports that she does not drink alcohol or use drugs.  Family History  Problem Relation Age of Onset  . Cancer Mother   . Diabetes Mother   . Heart disease Mother   . Heart attack Father   . Stroke Father   . Parkinson's disease Brother   . Cancer Sister     Medications: Patient's Medications  New Prescriptions   No medications on file  Previous Medications   ACETAMINOPHEN (TYLENOL) 325 MG TABLET    Take 650 mg by mouth every 4 (four) hours as needed for headache (muscle pain).    ALLOPURINOL (ZYLOPRIM) 100 MG TABLET    Take 100 mg by mouth daily.    AMIODARONE (PACERONE) 200 MG TABLET    Take 200 mg by mouth daily.    ASPIRIN 325 MG EC TABLET    Take 325 mg by mouth daily.   ATORVASTATIN (LIPITOR) 80 MG TABLET    Take 1 tablet (80 mg total) by mouth daily at 6 PM.   B COMPLEX-C-FOLIC ACID (NEPHRO-VITE  PO)    Take 1 tablet by mouth at bedtime.    CETIRIZINE (ZYRTEC) 10 MG TABLET    Take 5 mg by mouth daily. Take 1/2 tablet to = 5 mg qd   DIAZEPAM (VALIUM) 5 MG TABLET    Take one tablet by mouth every night at bedtime for rest   DICYCLOMINE (BENTYL) 20 MG TABLET    Take 1 tablet (20 mg total) by mouth 3 (three) times daily.   DORZOLAMIDE-TIMOLOL (COSOPT) 22.3-6.8 MG/ML OPHTHALMIC SOLUTION    Place 1 drop into the left eye 2 (two) times daily. Wait 2-5 minutes when giving two eye meds   FLUTICASONE (FLONASE ALLERGY RELIEF) 50 MCG/ACT NASAL SPRAY    Place 2 sprays into both nostrils daily.  FOLIC ACID (FOLVITE) 1 MG TABLET    Take 1 mg by mouth daily.   INSULIN LISPRO (HUMALOG) 100 UNIT/ML INJECTION    Inject 5 Units into the skin. Give 5 units SQ for CBG >250   LOPERAMIDE (IMODIUM A-D) 2 MG TABLET    Take 2 mg by mouth 3 (three) times daily as needed for diarrhea or loose stools. May take one dose a day and refuse the other two if not needed.   MECLIZINE (ANTIVERT) 12.5 MG TABLET    Take 12.5 mg by mouth 2 (two) times daily as needed for dizziness.    MELATONIN 5 MG TABS    Take 5 mg by mouth at bedtime.   METOPROLOL TARTRATE (LOPRESSOR) 25 MG TABLET    Take 12.5-25 mg by mouth 2 (two) times daily. Take 1 tab qam, take 1/2 tab at 5PM   MIDODRINE (PROAMATINE) 10 MG TABLET    Take 10 mg by mouth every Monday, Wednesday, and Friday. On dialysis days   OXYGEN    Inhale 2 L/min into the lungs as needed.    PANTOPRAZOLE (PROTONIX) 40 MG TABLET    Take 40 mg by mouth daily.    PROMETHAZINE (PHENERGAN) 25 MG TABLET    Take 25 mg by mouth every 8 (eight) hours as needed for nausea or vomiting.   TRAMADOL (ULTRAM) 50 MG TABLET    Take 50 mg by mouth every 8 (eight) hours as needed for severe pain.   UNABLE TO FIND    Med Name: Preparation H CRM Aloe.  Apply to external hemorrhoids BID PRN.  Modified Medications   No medications on file  Discontinued Medications   OXYCODONE (OXY-IR) 5 MG CAPSULE    Take 5  mg by mouth every 8 (eight) hours as needed.      Physical Exam: Vitals:   11/04/16 1246  BP: (!) 126/52  Pulse: 64  Resp: 18  Temp: 97.8 F (36.6 C)  TempSrc: Oral  SpO2: 99%  Weight: 161 lb 6.4 oz (73.2 kg)  Height: 5\' 3"  (1.6 m)   Body mass index is 28.59 kg/m.   Wt Readings from Last 3 Encounters:  11/04/16 161 lb 6.4 oz (73.2 kg)  10/17/16 160 lb (72.6 kg)  10/08/16 160 lb (72.6 kg)   BP Readings from Last 3 Encounters:  11/04/16 (!) 126/52  10/17/16 116/74  10/08/16 121/67    General- elderly overweight female, in no acute distress Head- normocephalic, atraumatic Eyes- normal conjunctiva, normal sclera Neck- no cervical lymphadenopathy Cardiovascular- normal s1,s2, no murmur Respiratory- bilateral clear to auscultation Abdomen- bowel sounds present, soft, non tender Musculoskeletal- able to move all 4 extremities, poor lower extremity strength left > right, trace leg edema Neurological- alert and oriented     Labs reviewed: Basic Metabolic Panel:  Recent Labs  03/10/16 06/17/16 06/25/16  NA 143 144 142  K 5.0 4.1 4.2  BUN 30* 17 27*  CREATININE 4.7* 2.6* 3.7*   Liver Function Tests:  Recent Labs  12/06/15 03/10/16 06/17/16  AST 33  33 44* 31  ALT 25  25 34 24  ALKPHOS 103  103 153* 107   No results for input(s): LIPASE, AMYLASE in the last 8760 hours. No results for input(s): AMMONIA in the last 8760 hours. CBC:  Recent Labs  03/10/16 06/17/16 09/04/16  WBC 6.7 8.4 8.6  NEUTROABS 3 4 4   HGB 10.9* 8.5* 12.9  HCT 36 28* 43  PLT 156 197 179    Lab  Results  Component Value Date   HGBA1C 5.5 10/22/2016   Lab Results  Component Value Date   TSH 2.58 10/22/2016     Assessment/Plan   Dm type 2 with nephropathy a1c of 5.5 suggestive of controlled DM. Currently on humalog 5 u bid for cbg > 250. Patient wants premeal insulin. Explained that she has controlled diabetes per a1c. Advised about harms of hypoglycemia. Patient voices  understanding this. Discontinue humalog order for now and monitor. Continue HD 3 days a week     [name], MD Internal Medicine [facility] [address] [place], [place] [address] Cell Phone (Monday-Friday 8 am - 5 pm): [phone] On Call: [phone] and follow prompts after 5 pm and on weekends Office Phone: [phone] Office Fax: [phone]

## 2016-11-05 DIAGNOSIS — N186 End stage renal disease: Secondary | ICD-10-CM | POA: Diagnosis not present

## 2016-11-05 DIAGNOSIS — D631 Anemia in chronic kidney disease: Secondary | ICD-10-CM | POA: Diagnosis not present

## 2016-11-05 DIAGNOSIS — D509 Iron deficiency anemia, unspecified: Secondary | ICD-10-CM | POA: Diagnosis not present

## 2016-11-05 DIAGNOSIS — E1122 Type 2 diabetes mellitus with diabetic chronic kidney disease: Secondary | ICD-10-CM | POA: Diagnosis not present

## 2016-11-05 DIAGNOSIS — N2581 Secondary hyperparathyroidism of renal origin: Secondary | ICD-10-CM | POA: Diagnosis not present

## 2016-11-07 DIAGNOSIS — D509 Iron deficiency anemia, unspecified: Secondary | ICD-10-CM | POA: Diagnosis not present

## 2016-11-07 DIAGNOSIS — N2581 Secondary hyperparathyroidism of renal origin: Secondary | ICD-10-CM | POA: Diagnosis not present

## 2016-11-07 DIAGNOSIS — E1122 Type 2 diabetes mellitus with diabetic chronic kidney disease: Secondary | ICD-10-CM | POA: Diagnosis not present

## 2016-11-07 DIAGNOSIS — N186 End stage renal disease: Secondary | ICD-10-CM | POA: Diagnosis not present

## 2016-11-07 DIAGNOSIS — D631 Anemia in chronic kidney disease: Secondary | ICD-10-CM | POA: Diagnosis not present

## 2016-11-09 DIAGNOSIS — N2581 Secondary hyperparathyroidism of renal origin: Secondary | ICD-10-CM | POA: Diagnosis not present

## 2016-11-09 DIAGNOSIS — D631 Anemia in chronic kidney disease: Secondary | ICD-10-CM | POA: Diagnosis not present

## 2016-11-09 DIAGNOSIS — E1122 Type 2 diabetes mellitus with diabetic chronic kidney disease: Secondary | ICD-10-CM | POA: Diagnosis not present

## 2016-11-09 DIAGNOSIS — D509 Iron deficiency anemia, unspecified: Secondary | ICD-10-CM | POA: Diagnosis not present

## 2016-11-09 DIAGNOSIS — N186 End stage renal disease: Secondary | ICD-10-CM | POA: Diagnosis not present

## 2016-11-11 ENCOUNTER — Encounter: Payer: Self-pay | Admitting: Adult Health

## 2016-11-11 DIAGNOSIS — D631 Anemia in chronic kidney disease: Secondary | ICD-10-CM | POA: Diagnosis not present

## 2016-11-11 DIAGNOSIS — E1122 Type 2 diabetes mellitus with diabetic chronic kidney disease: Secondary | ICD-10-CM | POA: Diagnosis not present

## 2016-11-11 DIAGNOSIS — N186 End stage renal disease: Secondary | ICD-10-CM | POA: Diagnosis not present

## 2016-11-11 DIAGNOSIS — D509 Iron deficiency anemia, unspecified: Secondary | ICD-10-CM | POA: Diagnosis not present

## 2016-11-11 DIAGNOSIS — N2581 Secondary hyperparathyroidism of renal origin: Secondary | ICD-10-CM | POA: Diagnosis not present

## 2016-11-11 NOTE — Progress Notes (Signed)
DATE:  11/11/2016 MRN:  [id number]  BIRTHDAY: 08/07/1936  Facility:  Nursing Home Location:  [place] Room Number: 205-A  LEVEL OF CARE:  SNF (31)  Contact Information    Name Relation Home Work [place] Daughter [phone]  [phone]   Mamie Laurel   [phone]   [place], [place] [phone]         Code Status History    Date Active Date Inactive Code Status Order ID Comments User Context   12/21/2014  1:14 PM 12/22/2014  4:36 PM DNR [id number]  Sol Blazing, MD Inpatient   12/19/2014  7:00 AM 12/21/2014  1:14 PM Full Code [id number]  Ivor Costa, MD ED   11/12/2014 12:06 PM 12/06/2014  8:35 PM Full Code [id number]  Azzie Roup, MD Inpatient   11/02/2014  4:18 PM 11/12/2014 12:06 PM Full Code [id number]  Ermalinda Memos Inpatient   10/17/2014  5:52 PM 11/02/2014  4:18 PM Full Code [id number]  Jonetta Osgood, MD Inpatient    Questions for Most Recent Historical Code Status (Order [id number])    Question Answer Comment   In the event of cardiac or respiratory ARREST Do not call a "code blue"    In the event of cardiac or respiratory ARREST Do not perform Intubation, CPR, defibrillation or ACLS    In the event of cardiac or respiratory ARREST Use medication by any route, position, wound care, and other measures to relive pain and suffering. May use oxygen, suction and manual treatment of airway obstruction as needed for comfort.        Chief Complaint  Patient presents with  . Medical Management of Chronic Issues    HISTORY OF PRESENT ILLNESS:    PAST MEDICAL HISTORY:  Past Medical History:  Diagnosis Date  . Anemia, chronic disease   . Anxiety   . Arthritis    knees  . Atrial fibrillation ([place])   . Barrett esophagus   . Benign paroxysmal positional vertigo   . Breast cancer ([place]) 1980s   bil mastectomies, no radiation or chemo  . Cataract   . Chest pain, atypical 12/05/2008   R/Lmv-  normal perfusion all regions, noe ECG changes   . CHF (congestive heart failure) ([place]) 05/13/2011   echo - EM>75%; stage 1 diastolic dysfunction; elevated LV filling pressure, MAC  . CKD (chronic kidney disease), stage III   . Claudication ([place]) 10/30/2005   doppler - normal evaluation, no evidence of aneurysm, diameter reduction, dissection, compression or vascular abnormality)  . Complication of anesthesia   . Dyslipidemia   . Dysrhythmia    Atrial Fibrillation  . Edema   . ESRD (end stage renal disease) ([place])   . Family history of adverse reaction to anesthesia    son also has nausea   . Gastritis and gastroduodenitis   . GERD (gastroesophageal reflux disease)   . Gout   . Hemodialysis-associated hypotension   . HLD (hyperlipidemia)   . Hypertension   . IBS (irritable bowel syndrome)   . Insomnia   . Morbid obesity ([place])   . PAF (paroxysmal atrial fibrillation) ([place])   . Peripheral neuropathy ([place])   . PONV (postoperative nausea and vomiting)   . Renal cyst   . Renal insufficiency 10/30/2005   doppler - abn resistance consistent w/ parenchymal disease  . Renovascular hypertension   . Type 2 diabetes mellitus with diabetic neuropathy, with long-term current use of insulin ([place])   .  Vertigo      CURRENT MEDICATIONS: Reviewed  Patient's Medications  New Prescriptions   No medications on file  Previous Medications   ACETAMINOPHEN (TYLENOL) 325 MG TABLET    Take 650 mg by mouth every 4 (four) hours as needed for headache (muscle pain).    ALLOPURINOL (ZYLOPRIM) 100 MG TABLET    Take 100 mg by mouth daily.    AMINO ACIDS-PROTEIN HYDROLYS (FEEDING SUPPLEMENT, PRO-STAT SUGAR FREE 64,) LIQD    Take 30 mLs by mouth 2 (two) times daily.   AMIODARONE (PACERONE) 200 MG TABLET    Take 200 mg by mouth daily.    ASPIRIN 325 MG EC TABLET    Take 325 mg by mouth daily.   ATORVASTATIN (LIPITOR) 80 MG TABLET    Take 1 tablet (80 mg total) by mouth daily at 6 PM.   B COMPLEX-C-FOLIC ACID  (NEPHRO-VITE PO)    Take 1 tablet by mouth at bedtime.    CETIRIZINE (ZYRTEC) 10 MG TABLET    Take 5 mg by mouth daily. Take 1/2 tablet to = 5 mg qd   DIAZEPAM (VALIUM) 5 MG TABLET    Take one tablet by mouth every night at bedtime for rest   DICYCLOMINE (BENTYL) 20 MG TABLET    Take 1 tablet (20 mg total) by mouth 3 (three) times daily.   DORZOLAMIDE-TIMOLOL (COSOPT) 22.3-6.8 MG/ML OPHTHALMIC SOLUTION    Place 1 drop into the left eye 2 (two) times daily. Wait 2-5 minutes when giving two eye meds   FLUTICASONE (FLONASE ALLERGY RELIEF) 50 MCG/ACT NASAL SPRAY    Place 2 sprays into both nostrils daily.   FOLIC ACID (FOLVITE) 1 MG TABLET    Take 1 mg by mouth daily.   LOPERAMIDE (IMODIUM A-D) 2 MG TABLET    Take 2 mg by mouth 3 (three) times daily. May take first dose and refuse other two if not needed   MECLIZINE (ANTIVERT) 12.5 MG TABLET    Take 12.5 mg by mouth 2 (two) times daily as needed for dizziness.    MELATONIN 5 MG TABS    Take 5 mg by mouth at bedtime.   METOPROLOL TARTRATE (LOPRESSOR) 25 MG TABLET    Take 12.5-25 mg by mouth 2 (two) times daily. Take 1 tab qam, take 1/2 tab at 5PM   MIDODRINE (PROAMATINE) 10 MG TABLET    Take 10 mg by mouth every Monday, Wednesday, and Friday. On dialysis days   OXYGEN    Inhale 2 L/min into the lungs as needed.    PANTOPRAZOLE (PROTONIX) 40 MG TABLET    Take 40 mg by mouth daily.    PROMETHAZINE (PHENERGAN) 25 MG TABLET    Take 25 mg by mouth every 8 (eight) hours as needed for nausea or vomiting.   TRAMADOL (ULTRAM) 50 MG TABLET    Take 50 mg by mouth every 8 (eight) hours as needed for severe pain.   UNABLE TO FIND    Med Name: Preparation H CRM Aloe.  Apply to external hemorrhoids BID PRN.  Modified Medications   No medications on file  Discontinued Medications   INSULIN LISPRO (HUMALOG) 100 UNIT/ML INJECTION    Inject 5 Units into the skin. Give 5 units SQ for CBG >250   LOPERAMIDE (IMODIUM A-D) 2 MG TABLET    Take 2 mg by mouth 3 (three) times  daily as needed for diarrhea or loose stools. May take one dose a day and refuse the other two if  not needed.     Allergies  Allergen Reactions  . Erythromycin Swelling  . Erythromycin Base     Other reaction(s): GI Upset (intolerance)  . Penicillins Other (See Comments)    Unknown allergic reaction.  She has taken keflex and Rocephin many times without problems  . Codeine Rash and Palpitations    Other reaction(s): GI Upset (intolerance)  . Penicillin G Rash     REVIEW OF SYSTEMS:  GENERAL: no change in appetite, no fatigue, no weight changes, no fever, chills or weakness SKIN: Denies rash, itching, wounds, ulcer sores, or nail abnormality EYES: Denies change in vision, dry eyes, eye pain, itching or discharge EARS: Denies change in hearing, ringing in ears, or earache NOSE: Denies nasal congestion or epistaxis MOUTH and THROAT: Denies oral discomfort, gingival pain or bleeding, pain from teeth or hoarseness   RESPIRATORY: no cough, SOB, DOE, wheezing, hemoptysis CARDIAC: no chest pain, edema or palpitations GI: no abdominal pain, diarrhea, constipation, heart burn, nausea or vomiting GU: Denies dysuria, frequency, hematuria, incontinence, or discharge MUSCULOSKELETAL: Denies joit pain, muscle pain, back pain, restricted movement, or unusual weakness CIRCULATION: Denies claudication, edema of legs, varicosities, or cold extremities NEUROLOGICAL: Denies dizziness, syncope, numbness, or headache PSYCHIATRIC: Denies feeling of depression or anxiety. No report of hallucinations, insomnia, paranoia, or agitation ENDOCRINE: Denies polyphagia, polyuria, polydipsia, heat or cold intolerance HEME/LYMPH: Denies excessive bruising, petechia, enlarged lymph nodes, or bleeding problems IMMUNOLOGIC: Denies history of frequent infections, AIDS, or use of immunosuppressive agents    PHYSICAL EXAMINATION  GENERAL APPEARANCE: Well nourished. In no acute distress. Normal body habitus SKIN:   Skin is warm and dry. There are no suspicious lesions or rash HEAD: Normal in size and contour. No evidence of trauma EYES: Lids open and close normally. No blepharitis, entropion or ectropion. PERRL. Conjunctivae are clear and sclerae are white. Lenses are without opacity EARS: Pinnae are normal. Patient hears normal voice tunes of the examiner MOUTH and THROAT: Lips are without lesions. Oral mucosa is moist and without lesions. Tongue is normal in shape, size, and color and without lesions NECK: supple, trachea midline, no neck masses, no thyroid tenderness, no thyromegaly LYMPHATICS: no LAN in the neck, no supraclavicular LAN RESPIRATORY: breathing is even & unlabored, BS CTAB CARDIAC: RRR, no murmur,no extra heart sounds, no edema GI: abdomen soft, normal BS, no masses, no tenderness, no hepatomegaly, no splenomegaly MUSCULOSKELETAL: No deformities. Movement at each extremity is full and painless. Strength is 5/5 at each extremity. Back is without kyphosis or scoliosis CIRCULATION: pedal pulses are 2+. There is no edema of the legs, ankles and feet NEUROLOGICAL: There is no tremor. Speech is clear PSYCHIATRIC: Alert and oriented X 3. Affect and behavior are appropriate  LABS/RADIOLOGY: Labs reviewed: Basic Metabolic Panel:  Recent Labs  03/10/16 06/17/16 06/25/16  NA 143 144 142  K 5.0 4.1 4.2  BUN 30* 17 27*  CREATININE 4.7* 2.6* 3.7*   Liver Function Tests:  Recent Labs  12/06/15 03/10/16 06/17/16  AST 33  33 44* 31  ALT 25  25 34 24  ALKPHOS 103  103 153* 107   No results for input(s): LIPASE, AMYLASE in the last 8760 hours. No results for input(s): AMMONIA in the last 8760 hours. CBC:  Recent Labs  03/10/16 06/17/16 09/04/16  WBC 6.7 8.4 8.6  NEUTROABS 3 4 4   HGB 10.9* 8.5* 12.9  HCT 36 28* 43  PLT 156 197 179   A1C: Invalid input(s): A1C Lipid Panel:  Recent  Labs  12/06/15 05/22/16 10/22/16  HDL 30*  30* 35 29*   Cardiac Enzymes: No results for  input(s): CKTOTAL, CKMB, CKMBINDEX, TROPONINI in the last 8760 hours. BNP: Invalid input(s): POCBNP CBG: No results for input(s): GLUCAP in the last 8760 hours.    No results found.  ASSESSMENT/PLAN:          Elmore Guise, [place] [phone]  This encounter was created in error - please disregard.

## 2016-11-12 ENCOUNTER — Non-Acute Institutional Stay (SKILLED_NURSING_FACILITY): Payer: Medicare Other | Admitting: Adult Health

## 2016-11-12 ENCOUNTER — Encounter: Payer: Self-pay | Admitting: Adult Health

## 2016-11-12 DIAGNOSIS — G4700 Insomnia, unspecified: Secondary | ICD-10-CM

## 2016-11-12 DIAGNOSIS — I5042 Chronic combined systolic (congestive) and diastolic (congestive) heart failure: Secondary | ICD-10-CM

## 2016-11-12 DIAGNOSIS — H409 Unspecified glaucoma: Secondary | ICD-10-CM | POA: Diagnosis not present

## 2016-11-12 DIAGNOSIS — E1122 Type 2 diabetes mellitus with diabetic chronic kidney disease: Secondary | ICD-10-CM

## 2016-11-12 DIAGNOSIS — I480 Paroxysmal atrial fibrillation: Secondary | ICD-10-CM | POA: Diagnosis not present

## 2016-11-12 DIAGNOSIS — M1000 Idiopathic gout, unspecified site: Secondary | ICD-10-CM | POA: Diagnosis not present

## 2016-11-12 DIAGNOSIS — E785 Hyperlipidemia, unspecified: Secondary | ICD-10-CM | POA: Diagnosis not present

## 2016-11-12 DIAGNOSIS — K2970 Gastritis, unspecified, without bleeding: Secondary | ICD-10-CM

## 2016-11-12 DIAGNOSIS — N186 End stage renal disease: Secondary | ICD-10-CM

## 2016-11-12 DIAGNOSIS — Z992 Dependence on renal dialysis: Secondary | ICD-10-CM

## 2016-11-12 DIAGNOSIS — F419 Anxiety disorder, unspecified: Secondary | ICD-10-CM

## 2016-11-12 DIAGNOSIS — J309 Allergic rhinitis, unspecified: Secondary | ICD-10-CM

## 2016-11-12 DIAGNOSIS — M1990 Unspecified osteoarthritis, unspecified site: Secondary | ICD-10-CM | POA: Diagnosis not present

## 2016-11-12 DIAGNOSIS — N185 Chronic kidney disease, stage 5: Secondary | ICD-10-CM

## 2016-11-12 DIAGNOSIS — I953 Hypotension of hemodialysis: Secondary | ICD-10-CM | POA: Diagnosis not present

## 2016-11-12 DIAGNOSIS — H8110 Benign paroxysmal vertigo, unspecified ear: Secondary | ICD-10-CM

## 2016-11-12 DIAGNOSIS — K589 Irritable bowel syndrome without diarrhea: Secondary | ICD-10-CM

## 2016-11-12 DIAGNOSIS — K2990 Gastroduodenitis, unspecified, without bleeding: Secondary | ICD-10-CM

## 2016-11-12 NOTE — Progress Notes (Signed)
Patient ID: [name] James, female   DOB: October 07, 1936, 80 y.o.   MRN: [id number]   DATE:     11/12/16  Facility:  Nursing Home Location:  [place] Room Number: 205-A LEVEL OF CARE:  SNF (31)   Chief Complaint  Patient presents with  . Medical Management of Chronic Issues    HISTORY OF PRESENT ILLNESS:  This is a 80 year old female who is being seen for a routine visit. She is a long-term care resident of [facility]. She was recently started on Prostat. Humalog was discontinued since latest hgbA1c 5.5.   PAST MEDICAL HISTORY:  Past Medical History:  Diagnosis Date  . Anemia, chronic disease   . Anxiety   . Arthritis    knees  . Atrial fibrillation ([place])   . Barrett esophagus   . Benign paroxysmal positional vertigo   . Breast cancer ([place]) 1980s   bil mastectomies, no radiation or chemo  . Cataract   . Chest pain, atypical 12/05/2008   R/Lmv- normal perfusion all regions, noe ECG changes   . CHF (congestive heart failure) ([place]) 05/13/2011   echo - TK>24%; stage 1 diastolic dysfunction; elevated LV filling pressure, MAC  . CKD (chronic kidney disease), stage III   . Claudication ([place]) 10/30/2005   doppler - normal evaluation, no evidence of aneurysm, diameter reduction, dissection, compression or vascular abnormality)  . Complication of anesthesia   . Dyslipidemia   . Dysrhythmia    Atrial Fibrillation  . Edema   . ESRD (end stage renal disease) ([place])   . Family history of adverse reaction to anesthesia    son also has nausea   . Gastritis and gastroduodenitis   . GERD (gastroesophageal reflux disease)   . Gout   . Hemodialysis-associated hypotension   . HLD (hyperlipidemia)   . Hypertension   . IBS (irritable bowel syndrome)   . Insomnia   . Morbid obesity ([place])   . PAF (paroxysmal atrial fibrillation) ([place])   . Peripheral neuropathy ([place])   . PONV (postoperative nausea and vomiting)   . Renal cyst   . Renal insufficiency  10/30/2005   doppler - abn resistance consistent w/ parenchymal disease  . Renovascular hypertension   . Type 2 diabetes mellitus with diabetic neuropathy, with long-term current use of insulin ([place])   . Vertigo     CURRENT MEDICATIONS: Reviewed per MAR/see medication list   Medication List       Accurate as of 11/12/16 11:59 PM. Always use your most recent med list.          acetaminophen 325 MG tablet Commonly known as:  TYLENOL Take 650 mg by mouth every 4 (four) hours as needed for headache (muscle pain).   allopurinol 100 MG tablet Commonly known as:  ZYLOPRIM Take 100 mg by mouth daily.   amiodarone 200 MG tablet Commonly known as:  PACERONE Take 200 mg by mouth daily.   aspirin 325 MG EC tablet Take 325 mg by mouth daily.   atorvastatin 80 MG tablet Commonly known as:  LIPITOR Take 1 tablet (80 mg total) by mouth daily at 6 PM.   cetirizine 10 MG tablet Commonly known as:  ZYRTEC Take 5 mg by mouth daily. Take 1/2 tablet to = 5 mg qd   diazepam 5 MG tablet Commonly known as:  VALIUM Take one tablet by mouth every night at bedtime for rest   dicyclomine 20 MG tablet Commonly known as:  BENTYL Take  1 tablet (20 mg total) by mouth 3 (three) times daily.   dorzolamide-timolol 22.3-6.8 MG/ML ophthalmic solution Commonly known as:  COSOPT Place 1 drop into the left eye 2 (two) times daily. Wait 2-5 minutes when giving two eye meds   feeding supplement (PRO-STAT SUGAR FREE 64) Liqd Take 30 mLs by mouth 2 (two) times daily.   FLONASE ALLERGY RELIEF 50 MCG/ACT nasal spray Generic drug:  fluticasone Place 2 sprays into both nostrils daily.   folic acid 1 MG tablet Commonly known as:  FOLVITE Take 1 mg by mouth daily.   loperamide 2 MG tablet Commonly known as:  IMODIUM A-D Take 2 mg by mouth 3 (three) times daily. May take first dose and refuse other two if not needed   meclizine 12.5 MG tablet Commonly known as:  ANTIVERT Take 12.5 mg by mouth 2 (two)  times daily as needed for dizziness.   Melatonin 5 MG Tabs Take 5 mg by mouth at bedtime.   metoprolol tartrate 25 MG tablet Commonly known as:  LOPRESSOR Take 12.5-25 mg by mouth 2 (two) times daily. Take 1 tab qam, take 1/2 tab at 5PM   midodrine 10 MG tablet Commonly known as:  PROAMATINE Take 10 mg by mouth every Monday, Wednesday, and Friday. On dialysis days   NEPHRO-VITE PO Take 1 tablet by mouth at bedtime.   OXYGEN Inhale 2 L/min into the lungs as needed.   pantoprazole 40 MG tablet Commonly known as:  PROTONIX Take 40 mg by mouth daily.   promethazine 25 MG tablet Commonly known as:  PHENERGAN Take 25 mg by mouth every 8 (eight) hours as needed for nausea or vomiting.   traMADol 50 MG tablet Commonly known as:  ULTRAM Take 50 mg by mouth every 8 (eight) hours as needed for severe pain.   UNABLE TO FIND Med Name: Preparation H CRM Aloe.  Apply to external hemorrhoids BID PRN.        Allergies  Allergen Reactions  . Erythromycin Swelling  . Erythromycin Base     Other reaction(s): GI Upset (intolerance)  . Penicillins Other (See Comments)    Unknown allergic reaction.  She has taken keflex and Rocephin many times without problems  . Codeine Rash and Palpitations    Other reaction(s): GI Upset (intolerance)  . Penicillin G Rash   REVIEW OF SYSTEMS:    GENERAL: no fatigue, no weight changes, no fever, chills  RESPIRATORY: no cough, SOB, DOE, wheezing, hemoptysis CARDIAC: no chest pain GI: no abdominal pain, diarrhea, constipation, heart burn, nausea or vomiting  PHYSICAL EXAMINATION  GENERAL: no acute distress SKIN:  Skin is warm and dry NECK: supple, trachea midline, no neck masses, no thyroid tenderness, no thyromegaly LYMPHATICS: no LAN in the neck, no supraclavicular LAN RESPIRATORY: breathing is even & unlabored, BS CTAB, O2 @ 2L/min via [place]  CARDIAC: RRR, no murmur,no extra heart sounds, no edema GI: abdomen soft, normal BS, no masses, no  tenderness, no hepatomegaly, no splenomegaly EXTREMITIES: Able to move all 4 extremities; Left upper arm AV fistula + for bruit and thrill PSYCHIATRIC: the patient is alert & oriented X 3, affect & behavior appropriate  LABS/RADIOLOGY: Labs reviewed: Basic Metabolic Panel:  Recent Labs  03/10/16 06/17/16 06/25/16  NA 143 144 142  K 5.0 4.1 4.2  BUN 30* 17 27*  CREATININE 4.7* 2.6* 3.7*   Liver Function Tests:  Recent Labs  12/06/15 03/10/16 06/17/16  AST 33  33 44* 31  ALT 25  25 34 24  ALKPHOS 103  103 153* 107    CBC:  Recent Labs  03/10/16 06/17/16 09/04/16  WBC 6.7 8.4 8.6  NEUTROABS 3 4 4   HGB 10.9* 8.5* 12.9  HCT 36 28* 43  PLT 156 197 179     ASSESSMENT/PLAN:   Chronic combined systolic and diastolic CHF - no SOB;  continue O@ @ 2L/min via [place] continuously;continue Metoprolol tartrate 25 mg 1 tab PO Q D  IBS - continue Bentyl 20 mg 1 tab by mouth 3 times a day and Imodium AD 2 mg 1 caplet by mouth 3 times a day  Glaucoma - no complaints of eye pain; continue Cosopt eye gtts instill 1 gtt into left eye BID  Diabetes mellitus with renal complication -   CBGs to be done BID and Humalog was recently discontinued Lab Results  Component Value Date   HGBA1C 5.5 10/22/2016    Paroxysmal atrial fibrillation - rate controlled; continue amiodarone 200 mg 1 tab by mouth daily and metoprolol tartrate 25 mg 1 tab by mouth every morning on Sun, Sat, Tues, Thurs and 12.5 mg PO Q PM on T-Th-Sat-Sun. and aspirin 325 mg 1 tab by mouth daily  Allergic rhinitis - continue Zyrtec 5 mg 1 tab by mouth daily at bedtime and Flonase 50 g 2 sprays into each nostril daily  Vertigo - continue meclizine 12.5 mg 1 tab by mouth twice a day when necessary  Hemodialysis associated hypotension - continue Midodrin 10 mg 1 tab by mouth every Mondays-Wednesdays-Fridays (dialysis days)  Gout - continue allopurinol 100 mg 1 tab by mouth daily  Hyperlipidemia - continue Lipitor 80 mg 1  tab by mouth daily Lab Results  Component Value Date   CHOL 110 10/22/2016   HDL 29 (A) 10/22/2016   LDLCALC 47 10/22/2016   TRIG 169 (A) 10/22/2016    Insomnia - continue melatonin 5 mg 1 tab by mouth daily at bedtime  Anxiety - continue Valium 5 mg 1 tab by mouth daily at bedtime  Gastritis - continue Protonix 40 mg 1 tab by mouth Q D  ESRD - on HD Q M-W-F  Osteoarthrosis - continue Tramadol 50 mg 1 tab by mouth every 8 hours when necessary and acetaminophen 325 mg take 2 tabs = 650 mg by mouth every 4 hours when necessary     Goals of care:  Long-term care    Durenda Age, NP [facility] [phone]

## 2016-11-14 DIAGNOSIS — N2581 Secondary hyperparathyroidism of renal origin: Secondary | ICD-10-CM | POA: Diagnosis not present

## 2016-11-14 DIAGNOSIS — E1122 Type 2 diabetes mellitus with diabetic chronic kidney disease: Secondary | ICD-10-CM | POA: Diagnosis not present

## 2016-11-14 DIAGNOSIS — D509 Iron deficiency anemia, unspecified: Secondary | ICD-10-CM | POA: Diagnosis not present

## 2016-11-14 DIAGNOSIS — D631 Anemia in chronic kidney disease: Secondary | ICD-10-CM | POA: Diagnosis not present

## 2016-11-14 DIAGNOSIS — N186 End stage renal disease: Secondary | ICD-10-CM | POA: Diagnosis not present

## 2016-11-17 DIAGNOSIS — E1122 Type 2 diabetes mellitus with diabetic chronic kidney disease: Secondary | ICD-10-CM | POA: Diagnosis not present

## 2016-11-17 DIAGNOSIS — D631 Anemia in chronic kidney disease: Secondary | ICD-10-CM | POA: Diagnosis not present

## 2016-11-17 DIAGNOSIS — N186 End stage renal disease: Secondary | ICD-10-CM | POA: Diagnosis not present

## 2016-11-17 DIAGNOSIS — D509 Iron deficiency anemia, unspecified: Secondary | ICD-10-CM | POA: Diagnosis not present

## 2016-11-17 DIAGNOSIS — N2581 Secondary hyperparathyroidism of renal origin: Secondary | ICD-10-CM | POA: Diagnosis not present

## 2016-11-19 DIAGNOSIS — N186 End stage renal disease: Secondary | ICD-10-CM | POA: Diagnosis not present

## 2016-11-19 DIAGNOSIS — D631 Anemia in chronic kidney disease: Secondary | ICD-10-CM | POA: Diagnosis not present

## 2016-11-19 DIAGNOSIS — N2581 Secondary hyperparathyroidism of renal origin: Secondary | ICD-10-CM | POA: Diagnosis not present

## 2016-11-19 DIAGNOSIS — D509 Iron deficiency anemia, unspecified: Secondary | ICD-10-CM | POA: Diagnosis not present

## 2016-11-19 DIAGNOSIS — E1122 Type 2 diabetes mellitus with diabetic chronic kidney disease: Secondary | ICD-10-CM | POA: Diagnosis not present

## 2016-11-20 DIAGNOSIS — E1122 Type 2 diabetes mellitus with diabetic chronic kidney disease: Secondary | ICD-10-CM | POA: Diagnosis not present

## 2016-11-20 DIAGNOSIS — N186 End stage renal disease: Secondary | ICD-10-CM | POA: Diagnosis not present

## 2016-11-20 DIAGNOSIS — Z992 Dependence on renal dialysis: Secondary | ICD-10-CM | POA: Diagnosis not present

## 2016-11-21 DIAGNOSIS — E1122 Type 2 diabetes mellitus with diabetic chronic kidney disease: Secondary | ICD-10-CM | POA: Diagnosis not present

## 2016-11-21 DIAGNOSIS — D631 Anemia in chronic kidney disease: Secondary | ICD-10-CM | POA: Diagnosis not present

## 2016-11-21 DIAGNOSIS — D509 Iron deficiency anemia, unspecified: Secondary | ICD-10-CM | POA: Diagnosis not present

## 2016-11-21 DIAGNOSIS — N186 End stage renal disease: Secondary | ICD-10-CM | POA: Diagnosis not present

## 2016-11-24 DIAGNOSIS — D509 Iron deficiency anemia, unspecified: Secondary | ICD-10-CM | POA: Diagnosis not present

## 2016-11-24 DIAGNOSIS — E1122 Type 2 diabetes mellitus with diabetic chronic kidney disease: Secondary | ICD-10-CM | POA: Diagnosis not present

## 2016-11-24 DIAGNOSIS — D631 Anemia in chronic kidney disease: Secondary | ICD-10-CM | POA: Diagnosis not present

## 2016-11-24 DIAGNOSIS — N186 End stage renal disease: Secondary | ICD-10-CM | POA: Diagnosis not present

## 2016-11-25 ENCOUNTER — Encounter: Payer: Self-pay | Admitting: Internal Medicine

## 2016-11-25 ENCOUNTER — Ambulatory Visit (INDEPENDENT_AMBULATORY_CARE_PROVIDER_SITE_OTHER): Payer: Medicare Other | Admitting: Internal Medicine

## 2016-11-25 VITALS — BP 106/50 | HR 68

## 2016-11-25 DIAGNOSIS — Z992 Dependence on renal dialysis: Secondary | ICD-10-CM

## 2016-11-25 DIAGNOSIS — D631 Anemia in chronic kidney disease: Secondary | ICD-10-CM

## 2016-11-25 DIAGNOSIS — R195 Other fecal abnormalities: Secondary | ICD-10-CM

## 2016-11-25 DIAGNOSIS — K2270 Barrett's esophagus without dysplasia: Secondary | ICD-10-CM

## 2016-11-25 DIAGNOSIS — N186 End stage renal disease: Secondary | ICD-10-CM

## 2016-11-25 DIAGNOSIS — K219 Gastro-esophageal reflux disease without esophagitis: Secondary | ICD-10-CM | POA: Diagnosis not present

## 2016-11-25 NOTE — Patient Instructions (Addendum)
Pantoprazole 40 mg-Increase to 1 tablet twice daily before meals x 1 month. Then decrease back to 1 tablet once daily thereafter.  Please take imodium 2 mg by mouth every 4-6 hours as needed only. Do NOT schedule at this time.  Please take phenergan 25 mg by mouth every 8 hours as needed for nausea.

## 2016-11-26 DIAGNOSIS — E1122 Type 2 diabetes mellitus with diabetic chronic kidney disease: Secondary | ICD-10-CM | POA: Diagnosis not present

## 2016-11-26 DIAGNOSIS — N186 End stage renal disease: Secondary | ICD-10-CM | POA: Diagnosis not present

## 2016-11-26 DIAGNOSIS — D631 Anemia in chronic kidney disease: Secondary | ICD-10-CM | POA: Diagnosis not present

## 2016-11-26 DIAGNOSIS — D509 Iron deficiency anemia, unspecified: Secondary | ICD-10-CM | POA: Diagnosis not present

## 2016-11-26 NOTE — Progress Notes (Signed)
Subjective:    Patient ID: [name] James, female    DOB: [birth year], 80 y.o.   MRN: [id number]  HPI [name] James is a 80 year old female with history of GERD, parents esophagus, gastritis, anemia, COPD on oxygen, type, stage renal disease on dialysis, breast cancer is here for follow-up. She is here today with her son. She was last seen in January 2017.  All in all she's been doing fairly well. She reports she is getting IV iron on occasion dialysis. Her hemoglobin has been normal. She reports recently her acid reflux medication was changed and she has been having typical heartburn with regurgitation. Previously she was on pantoprazole 40 mg daily with excellent control. She doesn't think she is getting this medication despite the fact that it is on her med list. Her son reports that she remains mentally sharp and he believes that she is not currently receiving it. She denies dysphagia. Intermittent issues with loose stools and she is using Imodium on an as-needed basis. She's also using bentyl which helps with occasional cramping pain. She denies visible blood in her stool or melena. Dialysis continues uneventfully though she does occasionally have issues with hypotension. This has not required them to stop dialysis. She gets midodrine on HD days.  Review of Systems As per HPI, otherwise negative  Current Medications, Allergies, Past Medical History, Past Surgical History, Family History and Social History were reviewed in [employer] record.     Objective:   Physical Exam BP (!) 106/50 (BP Location: Right Arm, Patient Position: Sitting, Cuff Size: Normal)   Pulse 68  Constitutional: Well-developed and well-nourished though chronically ill appearing female.  No distress. HEENT: Normocephalic and atraumatic.  Conjunctivae are normal.  No scleral icterus. Cardiovascular: Normal rate, regular rhythm and intact distal pulses.  Pulmonary/chest: Effort normal and breath  sounds normal.  Abdominal: Soft, nontender, nondistended. Bowel sounds active throughout.  Extremities: no clubbing, cyanosis, or edema Neurological: Alert and oriented to person place and time. Skin: Skin is warm and dry.  Psychiatric: Normal mood and affect. Behavior is normal.  CBC    Component Value Date/Time   WBC 8.6 09/04/2016   WBC 9.1 12/22/2014 0430   RBC 3.64 (L) 12/22/2014 0430   HGB 12.9 09/04/2016   HGB 13.0 09/06/2008 1552   HCT 43 09/04/2016   HCT 38.3 09/06/2008 1552   PLT 179 09/04/2016   PLT 188 09/06/2008 1552   MCV 97.0 12/22/2014 0430   MCV 95.6 04/02/2014 0908   MCV 95.0 09/06/2008 1552   MCH 28.6 12/22/2014 0430   MCHC 29.5 (L) 12/22/2014 0430   RDW 16.3 (H) 12/22/2014 0430   RDW 15.3 (H) 09/06/2008 1552   LYMPHSABS 3.7 12/18/2014 1918   LYMPHSABS 4.2 (H) 09/06/2008 1552   MONOABS 0.9 12/18/2014 1918   MONOABS 0.7 09/06/2008 1552   EOSABS 0.2 12/18/2014 1918   EOSABS 0.2 09/06/2008 1552   BASOSABS 0.0 12/18/2014 1918   BASOSABS 0.0 09/06/2008 1552       Assessment & Plan:  80 year old female with history of GERD, parents esophagus, gastritis, anemia, COPD on oxygen, type, stage renal disease on dialysis, breast cancer is here for follow-up.   1. GERD -- ? Of whether she is receiving pantoprazole. This has worked well for her in the past. I recommend pantoprazole 40 mg twice a day before meals 1 month then daily before breakfast thereafter. This therapy was recommended indefinitely given her history of GERD and Barrett's esophagus. Given her advanced  age and multiple medical comorbidities I have recommended against further endoscopic surveillance/screening procedures. She and her family agree.  2. Intermittent loose stools -- continue Bentyl 20 mg 3 times a day. Imodium when necessary. Symptoms stable  3. Occasional nausea -- Phenergan 12.5-25 mg every 6-8 hours as needed  4. Anemia -- followed closely by nephrology. Getting IV iron as needed.  Recent hemoglobin normal which is very reassuring argues against significant ongoing GI blood loss.  followup as needed 25 minutes spent with the patient today. Greater than 50% was spent in counseling and coordination of care with the patient

## 2016-11-28 DIAGNOSIS — N186 End stage renal disease: Secondary | ICD-10-CM | POA: Diagnosis not present

## 2016-11-28 DIAGNOSIS — E1122 Type 2 diabetes mellitus with diabetic chronic kidney disease: Secondary | ICD-10-CM | POA: Diagnosis not present

## 2016-11-28 DIAGNOSIS — D631 Anemia in chronic kidney disease: Secondary | ICD-10-CM | POA: Diagnosis not present

## 2016-11-28 DIAGNOSIS — D509 Iron deficiency anemia, unspecified: Secondary | ICD-10-CM | POA: Diagnosis not present

## 2016-12-01 DIAGNOSIS — N186 End stage renal disease: Secondary | ICD-10-CM | POA: Diagnosis not present

## 2016-12-01 DIAGNOSIS — D631 Anemia in chronic kidney disease: Secondary | ICD-10-CM | POA: Diagnosis not present

## 2016-12-01 DIAGNOSIS — D509 Iron deficiency anemia, unspecified: Secondary | ICD-10-CM | POA: Diagnosis not present

## 2016-12-01 DIAGNOSIS — E1122 Type 2 diabetes mellitus with diabetic chronic kidney disease: Secondary | ICD-10-CM | POA: Diagnosis not present

## 2016-12-03 DIAGNOSIS — D631 Anemia in chronic kidney disease: Secondary | ICD-10-CM | POA: Diagnosis not present

## 2016-12-03 DIAGNOSIS — N186 End stage renal disease: Secondary | ICD-10-CM | POA: Diagnosis not present

## 2016-12-03 DIAGNOSIS — E1122 Type 2 diabetes mellitus with diabetic chronic kidney disease: Secondary | ICD-10-CM | POA: Diagnosis not present

## 2016-12-03 DIAGNOSIS — D509 Iron deficiency anemia, unspecified: Secondary | ICD-10-CM | POA: Diagnosis not present

## 2016-12-05 DIAGNOSIS — E1122 Type 2 diabetes mellitus with diabetic chronic kidney disease: Secondary | ICD-10-CM | POA: Diagnosis not present

## 2016-12-05 DIAGNOSIS — D509 Iron deficiency anemia, unspecified: Secondary | ICD-10-CM | POA: Diagnosis not present

## 2016-12-05 DIAGNOSIS — D631 Anemia in chronic kidney disease: Secondary | ICD-10-CM | POA: Diagnosis not present

## 2016-12-05 DIAGNOSIS — N186 End stage renal disease: Secondary | ICD-10-CM | POA: Diagnosis not present

## 2016-12-08 DIAGNOSIS — N186 End stage renal disease: Secondary | ICD-10-CM | POA: Diagnosis not present

## 2016-12-08 DIAGNOSIS — D509 Iron deficiency anemia, unspecified: Secondary | ICD-10-CM | POA: Diagnosis not present

## 2016-12-08 DIAGNOSIS — E1122 Type 2 diabetes mellitus with diabetic chronic kidney disease: Secondary | ICD-10-CM | POA: Diagnosis not present

## 2016-12-08 DIAGNOSIS — D631 Anemia in chronic kidney disease: Secondary | ICD-10-CM | POA: Diagnosis not present

## 2016-12-10 DIAGNOSIS — E1122 Type 2 diabetes mellitus with diabetic chronic kidney disease: Secondary | ICD-10-CM | POA: Diagnosis not present

## 2016-12-10 DIAGNOSIS — N186 End stage renal disease: Secondary | ICD-10-CM | POA: Diagnosis not present

## 2016-12-10 DIAGNOSIS — D509 Iron deficiency anemia, unspecified: Secondary | ICD-10-CM | POA: Diagnosis not present

## 2016-12-10 DIAGNOSIS — D631 Anemia in chronic kidney disease: Secondary | ICD-10-CM | POA: Diagnosis not present

## 2016-12-12 DIAGNOSIS — E1122 Type 2 diabetes mellitus with diabetic chronic kidney disease: Secondary | ICD-10-CM | POA: Diagnosis not present

## 2016-12-12 DIAGNOSIS — N186 End stage renal disease: Secondary | ICD-10-CM | POA: Diagnosis not present

## 2016-12-12 DIAGNOSIS — D631 Anemia in chronic kidney disease: Secondary | ICD-10-CM | POA: Diagnosis not present

## 2016-12-12 DIAGNOSIS — D509 Iron deficiency anemia, unspecified: Secondary | ICD-10-CM | POA: Diagnosis not present

## 2016-12-14 DIAGNOSIS — D509 Iron deficiency anemia, unspecified: Secondary | ICD-10-CM | POA: Diagnosis not present

## 2016-12-14 DIAGNOSIS — D631 Anemia in chronic kidney disease: Secondary | ICD-10-CM | POA: Diagnosis not present

## 2016-12-14 DIAGNOSIS — E1122 Type 2 diabetes mellitus with diabetic chronic kidney disease: Secondary | ICD-10-CM | POA: Diagnosis not present

## 2016-12-14 DIAGNOSIS — N186 End stage renal disease: Secondary | ICD-10-CM | POA: Diagnosis not present

## 2016-12-17 DIAGNOSIS — N186 End stage renal disease: Secondary | ICD-10-CM | POA: Diagnosis not present

## 2016-12-17 DIAGNOSIS — D509 Iron deficiency anemia, unspecified: Secondary | ICD-10-CM | POA: Diagnosis not present

## 2016-12-17 DIAGNOSIS — D631 Anemia in chronic kidney disease: Secondary | ICD-10-CM | POA: Diagnosis not present

## 2016-12-17 DIAGNOSIS — E1122 Type 2 diabetes mellitus with diabetic chronic kidney disease: Secondary | ICD-10-CM | POA: Diagnosis not present

## 2016-12-18 DIAGNOSIS — T82858A Stenosis of vascular prosthetic devices, implants and grafts, initial encounter: Secondary | ICD-10-CM | POA: Diagnosis not present

## 2016-12-18 DIAGNOSIS — I871 Compression of vein: Secondary | ICD-10-CM | POA: Diagnosis not present

## 2016-12-18 DIAGNOSIS — Z992 Dependence on renal dialysis: Secondary | ICD-10-CM | POA: Diagnosis not present

## 2016-12-18 DIAGNOSIS — N186 End stage renal disease: Secondary | ICD-10-CM | POA: Diagnosis not present

## 2016-12-19 DIAGNOSIS — D509 Iron deficiency anemia, unspecified: Secondary | ICD-10-CM | POA: Diagnosis not present

## 2016-12-19 DIAGNOSIS — D631 Anemia in chronic kidney disease: Secondary | ICD-10-CM | POA: Diagnosis not present

## 2016-12-19 DIAGNOSIS — N186 End stage renal disease: Secondary | ICD-10-CM | POA: Diagnosis not present

## 2016-12-19 DIAGNOSIS — E1122 Type 2 diabetes mellitus with diabetic chronic kidney disease: Secondary | ICD-10-CM | POA: Diagnosis not present

## 2016-12-21 DIAGNOSIS — D631 Anemia in chronic kidney disease: Secondary | ICD-10-CM | POA: Diagnosis not present

## 2016-12-21 DIAGNOSIS — N186 End stage renal disease: Secondary | ICD-10-CM | POA: Diagnosis not present

## 2016-12-21 DIAGNOSIS — E1122 Type 2 diabetes mellitus with diabetic chronic kidney disease: Secondary | ICD-10-CM | POA: Diagnosis not present

## 2016-12-21 DIAGNOSIS — Z992 Dependence on renal dialysis: Secondary | ICD-10-CM | POA: Diagnosis not present

## 2016-12-21 DIAGNOSIS — D509 Iron deficiency anemia, unspecified: Secondary | ICD-10-CM | POA: Diagnosis not present

## 2016-12-22 DIAGNOSIS — J189 Pneumonia, unspecified organism: Secondary | ICD-10-CM

## 2016-12-22 DIAGNOSIS — B001 Herpesviral vesicular dermatitis: Secondary | ICD-10-CM

## 2016-12-22 HISTORY — DX: Pneumonia, unspecified organism: J18.9

## 2016-12-22 HISTORY — DX: Herpesviral vesicular dermatitis: B00.1

## 2016-12-24 DIAGNOSIS — N186 End stage renal disease: Secondary | ICD-10-CM | POA: Diagnosis not present

## 2016-12-24 DIAGNOSIS — E1122 Type 2 diabetes mellitus with diabetic chronic kidney disease: Secondary | ICD-10-CM | POA: Diagnosis not present

## 2016-12-24 DIAGNOSIS — D631 Anemia in chronic kidney disease: Secondary | ICD-10-CM | POA: Diagnosis not present

## 2016-12-24 DIAGNOSIS — N2581 Secondary hyperparathyroidism of renal origin: Secondary | ICD-10-CM | POA: Diagnosis not present

## 2016-12-24 DIAGNOSIS — D509 Iron deficiency anemia, unspecified: Secondary | ICD-10-CM | POA: Diagnosis not present

## 2016-12-25 ENCOUNTER — Non-Acute Institutional Stay (SKILLED_NURSING_FACILITY): Payer: Medicare Other | Admitting: Adult Health

## 2016-12-25 ENCOUNTER — Encounter: Payer: Self-pay | Admitting: Adult Health

## 2016-12-25 DIAGNOSIS — R059 Cough, unspecified: Secondary | ICD-10-CM

## 2016-12-25 DIAGNOSIS — N186 End stage renal disease: Secondary | ICD-10-CM | POA: Diagnosis not present

## 2016-12-25 DIAGNOSIS — Z794 Long term (current) use of insulin: Secondary | ICD-10-CM

## 2016-12-25 DIAGNOSIS — N184 Chronic kidney disease, stage 4 (severe): Secondary | ICD-10-CM | POA: Diagnosis not present

## 2016-12-25 DIAGNOSIS — E785 Hyperlipidemia, unspecified: Secondary | ICD-10-CM

## 2016-12-25 DIAGNOSIS — R0989 Other specified symptoms and signs involving the circulatory and respiratory systems: Secondary | ICD-10-CM | POA: Diagnosis not present

## 2016-12-25 DIAGNOSIS — F419 Anxiety disorder, unspecified: Secondary | ICD-10-CM | POA: Diagnosis not present

## 2016-12-25 DIAGNOSIS — Z992 Dependence on renal dialysis: Secondary | ICD-10-CM | POA: Diagnosis not present

## 2016-12-25 DIAGNOSIS — I953 Hypotension of hemodialysis: Secondary | ICD-10-CM | POA: Diagnosis not present

## 2016-12-25 DIAGNOSIS — E1122 Type 2 diabetes mellitus with diabetic chronic kidney disease: Secondary | ICD-10-CM | POA: Diagnosis not present

## 2016-12-25 DIAGNOSIS — I5042 Chronic combined systolic (congestive) and diastolic (congestive) heart failure: Secondary | ICD-10-CM

## 2016-12-25 DIAGNOSIS — I480 Paroxysmal atrial fibrillation: Secondary | ICD-10-CM

## 2016-12-25 DIAGNOSIS — R05 Cough: Secondary | ICD-10-CM | POA: Diagnosis not present

## 2016-12-25 NOTE — Progress Notes (Signed)
Patient ID: [name] James, female   DOB: [birth year], 81 y.o.   MRN: [id number]   DATE:     12/25/16  Facility:  Nursing Home Location:  [place] Room Number: 205-A LEVEL OF CARE:  SNF (31)   Chief Complaint  Patient presents with  . Medical Management of Chronic Issues    HISTORY OF PRESENT ILLNESS:  This is a 81 year old female who is being seen for a routine visit. She is a long-term care resident of [facility]. Midodrine was recently changed to 10 mg Q M_W_F and 5 mg PO mid hemodialysis due to hypotension. Prostat was discontinued per her request. She complains of cough with yellowish phlegm. No fever has been reported.    PAST MEDICAL HISTORY:  Past Medical History:  Diagnosis Date  . Anemia, chronic disease   . Anxiety   . Arthritis    knees  . Atrial fibrillation ([place])   . Barrett esophagus   . Benign paroxysmal positional vertigo   . Breast cancer ([place]) 1980s   bil mastectomies, no radiation or chemo  . Cataract   . Chest pain, atypical 12/05/2008   R/Lmv- normal perfusion all regions, noe ECG changes   . CHF (congestive heart failure) ([place]) 05/13/2011   echo - CV>89%; stage 1 diastolic dysfunction; elevated LV filling pressure, MAC  . CKD (chronic kidney disease), stage III   . Claudication ([place]) 10/30/2005   doppler - normal evaluation, no evidence of aneurysm, diameter reduction, dissection, compression or vascular abnormality)  . Complication of anesthesia   . Dyslipidemia   . Dysrhythmia    Atrial Fibrillation  . Edema   . ESRD (end stage renal disease) ([place])   . Family history of adverse reaction to anesthesia    son also has nausea   . Gastritis and gastroduodenitis   . GERD (gastroesophageal reflux disease)   . Gout   . Hemodialysis-associated hypotension   . HLD (hyperlipidemia)   . Hypertension   . IBS (irritable bowel syndrome)   . Insomnia   . Morbid obesity ([place])   . PAF (paroxysmal atrial fibrillation)  ([place])   . Peripheral neuropathy ([place])   . PONV (postoperative nausea and vomiting)   . Renal cyst   . Renal insufficiency 10/30/2005   doppler - abn resistance consistent w/ parenchymal disease  . Renovascular hypertension   . Type 2 diabetes mellitus with diabetic neuropathy, with long-term current use of insulin ([place])   . Vertigo     CURRENT MEDICATIONS: Reviewed per MAR/see medication list Allergies as of 12/25/2016      Reactions   Erythromycin Swelling   Erythromycin Base    Other reaction(s): GI Upset (intolerance)   Penicillins Other (See Comments)   Unknown allergic reaction.  She has taken keflex and Rocephin many times without problems   Codeine Rash, Palpitations   Other reaction(s): GI Upset (intolerance)   Penicillin G Rash      Medication List       Accurate as of 12/25/16  9:26 PM. Always use your most recent med list.          acetaminophen 325 MG tablet Commonly known as:  TYLENOL Take 650 mg by mouth every 4 (four) hours as needed for headache (muscle pain).   allopurinol 100 MG tablet Commonly known as:  ZYLOPRIM Take 100 mg by mouth daily.   amiodarone 200 MG tablet Commonly known as:  PACERONE Take 200 mg by mouth daily.  aspirin 325 MG EC tablet Take 325 mg by mouth daily.   atorvastatin 80 MG tablet Commonly known as:  LIPITOR Take 1 tablet (80 mg total) by mouth daily at 6 PM.   cetirizine 10 MG tablet Commonly known as:  ZYRTEC Take 5 mg by mouth at bedtime. Take 1/2 tablet to = 5 mg qd   diazepam 5 MG tablet Commonly known as:  VALIUM Take one tablet by mouth every night at bedtime for rest   dicyclomine 20 MG tablet Commonly known as:  BENTYL Take 1 tablet (20 mg total) by mouth 3 (three) times daily.   dorzolamide-timolol 22.3-6.8 MG/ML ophthalmic solution Commonly known as:  COSOPT Place 1 drop into the left eye 2 (two) times daily. Wait 2-5 minutes when giving two eye meds   FLONASE ALLERGY RELIEF 50 MCG/ACT nasal  spray Generic drug:  fluticasone Place 2 sprays into both nostrils daily.   folic acid 1 MG tablet Commonly known as:  FOLVITE Take 1 mg by mouth daily.   loperamide 2 MG tablet Commonly known as:  IMODIUM A-D Take 2 mg by mouth 3 (three) times daily. May take first dose and refuse other two if not needed   meclizine 12.5 MG tablet Commonly known as:  ANTIVERT Take 12.5 mg by mouth 2 (two) times daily as needed for dizziness.   Melatonin 5 MG Tabs Take 5 mg by mouth at bedtime.   metoprolol tartrate 25 MG tablet Commonly known as:  LOPRESSOR Take 12.5-25 mg by mouth. Take 25 mg QAM, take 1/2 tablet to = 12.5 mg at 5PM   midodrine 10 MG tablet Commonly known as:  PROAMATINE Take 10 mg by mouth every Monday, Wednesday, and Friday. On dialysis days   NEPHRO-VITE PO Take 1 tablet by mouth at bedtime.   OXYGEN Inhale 2 L/min into the lungs as needed.   pantoprazole 40 MG tablet Commonly known as:  PROTONIX Take 40 mg by mouth daily.   promethazine 25 MG tablet Commonly known as:  PHENERGAN Take 25 mg by mouth. Take 1 tablet daily prn and q8h prn   traMADol 50 MG tablet Commonly known as:  ULTRAM Take 50 mg by mouth every 8 (eight) hours as needed for severe pain.   UNABLE TO FIND Med Name: Preparation H CRM Aloe.  Apply to external hemorrhoids BID PRN.        Allergies  Allergen Reactions  . Erythromycin Swelling  . Erythromycin Base     Other reaction(s): GI Upset (intolerance)  . Penicillins Other (See Comments)    Unknown allergic reaction.  She has taken keflex and Rocephin many times without problems  . Codeine Rash and Palpitations    Other reaction(s): GI Upset (intolerance)  . Penicillin G Rash   REVIEW OF SYSTEMS:    GENERAL: no fatigue, no weight changes, no fever, chills  RESPIRATORY: no cough, SOB, DOE, wheezing, hemoptysis CARDIAC: no chest pain GI: no abdominal pain, diarrhea, constipation, heart burn, nausea or vomiting  PHYSICAL  EXAMINATION  GENERAL: no acute distress SKIN:  Skin is warm and dry NECK: supple, trachea midline, no neck masses, no thyroid tenderness, no thyromegaly LYMPHATICS: no LAN in the neck, no supraclavicular LAN RESPIRATORY: breathing is even & unlabored, BS CTAB, O2 @ 2L/min via [place]  CARDIAC: RRR, no murmur,no extra heart sounds, no edema GI: abdomen soft, normal BS, no masses, no tenderness, no hepatomegaly, no splenomegaly EXTREMITIES: Able to move all 4 extremities; Left upper arm AV fistula +  for bruit and thrill PSYCHIATRIC: the patient is alert & oriented X 3, affect & behavior appropriate  LABS/RADIOLOGY: Labs reviewed: Basic Metabolic Panel:  Recent Labs  03/10/16 06/17/16 06/25/16  NA 143 144 142  K 5.0 4.1 4.2  BUN 30* 17 27*  CREATININE 4.7* 2.6* 3.7*   Liver Function Tests:  Recent Labs  03/10/16 06/17/16  AST 44* 31  ALT 34 24  ALKPHOS 153* 107    CBC:  Recent Labs  03/10/16 06/17/16 09/04/16  WBC 6.7 8.4 8.6  NEUTROABS _0 HGB 10.9* 8.5* 12.9  HCT 36 28* 43  PLT 156 197 179     ASSESSMENT/PLAN:   Hemodialysis associated hypotension - continue Midodrin 10 mg 1 tab by mouth every Mondays-Wednesdays-Fridays (dialysis days) and recently started on Midodrine 5 mg PO mid treatment  Anxiety - mood is stable; continue Valium 5 mg 1 tab by mouth daily at bedtime  Cough - Start Robitussin 10 mL by mouth every morning and at bedtime 1 week; chest x-ray to rule out any infiltrates  Chronic combined systolic and diastolic CHF - no SOB;  continue O@ @ 2L/min via [place] continuously;continue Metoprolol tartrate 25 mg 1 tab PO Q D  Paroxysmal atrial fibrillation - rate controlled; continue amiodarone 200 mg 1 tab by mouth daily and metoprolol tartrate 25 mg 1 tab by mouth every morning on Sun, Sat, Tues, Thurs and 12.5 mg PO Q PM on T-Th-Sat-Sun. and aspirin 325 mg 1 tab by mouth daily  Diabetes mellitus, type II - diet-controlled; podiatry and opthalmology  consult Lab Results  Component Value Date   HGBA1C 5.5 10/22/2016   Hyperlipidemia - continue Lipitor 80 mg 1 tab by mouth daily Lab Results  Component Value Date   CHOL 110 10/22/2016   HDL 29 (A) 10/22/2016   LDLCALC 47 10/22/2016   TRIG 169 (A) 10/22/2016   ESRD - on HD Q M-W-F     Goals of care:  Long-term care    Durenda Age, NP [place] [phone]

## 2016-12-26 DIAGNOSIS — N2581 Secondary hyperparathyroidism of renal origin: Secondary | ICD-10-CM | POA: Diagnosis not present

## 2016-12-26 DIAGNOSIS — D631 Anemia in chronic kidney disease: Secondary | ICD-10-CM | POA: Diagnosis not present

## 2016-12-26 DIAGNOSIS — D509 Iron deficiency anemia, unspecified: Secondary | ICD-10-CM | POA: Diagnosis not present

## 2016-12-26 DIAGNOSIS — N186 End stage renal disease: Secondary | ICD-10-CM | POA: Diagnosis not present

## 2016-12-26 DIAGNOSIS — E1122 Type 2 diabetes mellitus with diabetic chronic kidney disease: Secondary | ICD-10-CM | POA: Diagnosis not present

## 2016-12-29 DIAGNOSIS — E1122 Type 2 diabetes mellitus with diabetic chronic kidney disease: Secondary | ICD-10-CM | POA: Diagnosis not present

## 2016-12-29 DIAGNOSIS — N186 End stage renal disease: Secondary | ICD-10-CM | POA: Diagnosis not present

## 2016-12-29 DIAGNOSIS — N2581 Secondary hyperparathyroidism of renal origin: Secondary | ICD-10-CM | POA: Diagnosis not present

## 2016-12-29 DIAGNOSIS — D509 Iron deficiency anemia, unspecified: Secondary | ICD-10-CM | POA: Diagnosis not present

## 2016-12-29 DIAGNOSIS — D631 Anemia in chronic kidney disease: Secondary | ICD-10-CM | POA: Diagnosis not present

## 2016-12-31 DIAGNOSIS — N186 End stage renal disease: Secondary | ICD-10-CM | POA: Diagnosis not present

## 2016-12-31 DIAGNOSIS — N2581 Secondary hyperparathyroidism of renal origin: Secondary | ICD-10-CM | POA: Diagnosis not present

## 2016-12-31 DIAGNOSIS — D509 Iron deficiency anemia, unspecified: Secondary | ICD-10-CM | POA: Diagnosis not present

## 2016-12-31 DIAGNOSIS — E1122 Type 2 diabetes mellitus with diabetic chronic kidney disease: Secondary | ICD-10-CM | POA: Diagnosis not present

## 2016-12-31 DIAGNOSIS — D631 Anemia in chronic kidney disease: Secondary | ICD-10-CM | POA: Diagnosis not present

## 2017-01-02 DIAGNOSIS — N2581 Secondary hyperparathyroidism of renal origin: Secondary | ICD-10-CM | POA: Diagnosis not present

## 2017-01-02 DIAGNOSIS — D509 Iron deficiency anemia, unspecified: Secondary | ICD-10-CM | POA: Diagnosis not present

## 2017-01-02 DIAGNOSIS — E1122 Type 2 diabetes mellitus with diabetic chronic kidney disease: Secondary | ICD-10-CM | POA: Diagnosis not present

## 2017-01-02 DIAGNOSIS — D631 Anemia in chronic kidney disease: Secondary | ICD-10-CM | POA: Diagnosis not present

## 2017-01-02 DIAGNOSIS — N186 End stage renal disease: Secondary | ICD-10-CM | POA: Diagnosis not present

## 2017-01-05 DIAGNOSIS — E1122 Type 2 diabetes mellitus with diabetic chronic kidney disease: Secondary | ICD-10-CM | POA: Diagnosis not present

## 2017-01-05 DIAGNOSIS — D631 Anemia in chronic kidney disease: Secondary | ICD-10-CM | POA: Diagnosis not present

## 2017-01-05 DIAGNOSIS — N186 End stage renal disease: Secondary | ICD-10-CM | POA: Diagnosis not present

## 2017-01-05 DIAGNOSIS — D509 Iron deficiency anemia, unspecified: Secondary | ICD-10-CM | POA: Diagnosis not present

## 2017-01-05 DIAGNOSIS — N2581 Secondary hyperparathyroidism of renal origin: Secondary | ICD-10-CM | POA: Diagnosis not present

## 2017-01-06 ENCOUNTER — Encounter: Payer: Self-pay | Admitting: Adult Health

## 2017-01-06 ENCOUNTER — Non-Acute Institutional Stay (SKILLED_NURSING_FACILITY): Payer: Medicare Other | Admitting: Adult Health

## 2017-01-06 DIAGNOSIS — R05 Cough: Secondary | ICD-10-CM

## 2017-01-06 DIAGNOSIS — J069 Acute upper respiratory infection, unspecified: Secondary | ICD-10-CM

## 2017-01-06 DIAGNOSIS — B001 Herpesviral vesicular dermatitis: Secondary | ICD-10-CM | POA: Diagnosis not present

## 2017-01-06 DIAGNOSIS — R059 Cough, unspecified: Secondary | ICD-10-CM

## 2017-01-06 LAB — CBC AND DIFFERENTIAL
HCT: 36 % (ref 36–46)
Hemoglobin: 11.5 g/dL — AB (ref 12.0–16.0)
Neutrophils Absolute: 6274 /uL
PLATELETS: 170 10*3/uL (ref 150–399)
WBC: 8.8 10^3/mL

## 2017-01-06 LAB — BASIC METABOLIC PANEL
BUN: 22 mg/dL — AB (ref 4–21)
CREATININE: 3.6 mg/dL — AB (ref 0.5–1.1)
Glucose: 166 mg/dL
POTASSIUM: 3.9 mmol/L (ref 3.4–5.3)
Sodium: 132 mmol/L — AB (ref 137–147)

## 2017-01-06 NOTE — Progress Notes (Signed)
Patient ID: [name] James, female   DOB: 04-13-1936, 81 y.o.   MRN: [id number]   DATE:     01/06/17  Facility:  Nursing Home Location:  [place] Room Number: 205-A LEVEL OF CARE:  SNF (31)   Chief Complaint  Patient presents with  . Acute Visit    Cough, congestion, fever blister    HISTORY OF PRESENT ILLNESS:  This is a 81 year old female who is being seen for an acute visit. She was recently started on Doxycycline for URI. She has productive cough with yellowish/brownish secretions. She was noted to have dried up blister on her upper lip. Daughter has been calling from [place] and wanting patient to be seen.   PAST MEDICAL HISTORY:  Past Medical History:  Diagnosis Date  . Anemia, chronic disease   . Anxiety   . Arthritis    knees  . Atrial fibrillation ([place])   . Barrett esophagus   . Benign paroxysmal positional vertigo   . Breast cancer ([place]) 1980s   bil mastectomies, no radiation or chemo  . Cataract   . Chest pain, atypical 12/05/2008   R/Lmv- normal perfusion all regions, noe ECG changes   . CHF (congestive heart failure) ([place]) 05/13/2011   echo - QH>47%; stage 1 diastolic dysfunction; elevated LV filling pressure, MAC  . CKD (chronic kidney disease), stage III   . Claudication ([place]) 10/30/2005   doppler - normal evaluation, no evidence of aneurysm, diameter reduction, dissection, compression or vascular abnormality)  . Complication of anesthesia   . Dyslipidemia   . Dysrhythmia    Atrial Fibrillation  . Edema   . ESRD (end stage renal disease) ([place])   . Family history of adverse reaction to anesthesia    son also has nausea   . Fever blister 12/2016  . Gastritis and gastroduodenitis   . GERD (gastroesophageal reflux disease)   . Gout   . Hemodialysis-associated hypotension   . HLD (hyperlipidemia)   . Hypertension   . IBS (irritable bowel syndrome)   . Insomnia   . Morbid obesity ([place])   . PAF (paroxysmal atrial  fibrillation) ([place])   . Peripheral neuropathy ([place])   . PONV (postoperative nausea and vomiting)   . Renal cyst   . Renal insufficiency 10/30/2005   doppler - abn resistance consistent w/ parenchymal disease  . Renovascular hypertension   . Type 2 diabetes mellitus with diabetic neuropathy, with long-term current use of insulin ([place])   . Vertigo     CURRENT MEDICATIONS: Reviewed per MAR/see medication list Allergies as of 01/06/2017      Reactions   Erythromycin Swelling   Erythromycin Base    Other reaction(s): GI Upset (intolerance)   Penicillins Other (See Comments)   Unknown allergic reaction.  She has taken keflex and Rocephin many times without problems   Codeine Rash, Palpitations   Other reaction(s): GI Upset (intolerance)   Penicillin G Rash      Medication List       Accurate as of 01/06/17 11:59 PM. Always use your most recent med list.          ABREVA 10 % Crea Generic drug:  Docosanol Apply 1 application topically. Apply to upper lip lesion 5 times a day for 7 days   acetaminophen 325 MG tablet Commonly known as:  TYLENOL Take 650 mg by mouth every 4 (four) hours as needed for headache (muscle pain).   albuterol (2.5 MG/3ML) 0.083% nebulizer solution Commonly  known as:  PROVENTIL Take 2.5 mg by nebulization. 6AM, 2PM, 10PM x1 week   allopurinol 100 MG tablet Commonly known as:  ZYLOPRIM Take 100 mg by mouth daily.   amiodarone 200 MG tablet Commonly known as:  PACERONE Take 200 mg by mouth daily.   aspirin 325 MG EC tablet Take 325 mg by mouth daily.   atorvastatin 80 MG tablet Commonly known as:  LIPITOR Take 1 tablet (80 mg total) by mouth daily at 6 PM.   cetirizine 10 MG tablet Commonly known as:  ZYRTEC Take 5 mg by mouth at bedtime. Take 1/2 tablet to = 5 mg qd   dextromethorphan-guaiFENesin 30-600 MG 12hr tablet Commonly known as:  MUCINEX DM Take 1 tablet by mouth 2 (two) times daily. X 1 week   diazepam 5 MG tablet Commonly known  as:  VALIUM Take one tablet by mouth every night at bedtime for rest   dicyclomine 20 MG tablet Commonly known as:  BENTYL Take 1 tablet (20 mg total) by mouth 3 (three) times daily.   dorzolamide-timolol 22.3-6.8 MG/ML ophthalmic solution Commonly known as:  COSOPT Place 1 drop into the left eye 2 (two) times daily. Wait 2-5 minutes when giving two eye meds   FLONASE ALLERGY RELIEF 50 MCG/ACT nasal spray Generic drug:  fluticasone Place 2 sprays into both nostrils daily.   folic acid 1 MG tablet Commonly known as:  FOLVITE Take 1 mg by mouth daily.   lanthanum 1000 MG chewable tablet Commonly known as:  FOSRENOL Chew 1,000 mg by mouth 3 (three) times daily with meals.   loperamide 2 MG tablet Commonly known as:  IMODIUM A-D Take 2 mg by mouth 3 (three) times daily. May take first dose and refuse other two if not needed   meclizine 12.5 MG tablet Commonly known as:  ANTIVERT Take 12.5 mg by mouth 2 (two) times daily as needed for dizziness.   Melatonin 5 MG Tabs Take 5 mg by mouth at bedtime.   metoprolol tartrate 25 MG tablet Commonly known as:  LOPRESSOR Take 12.5-25 mg by mouth. Take 25 mg Tues, Thurs, Sat, Sun at [place].  Take 1/2 tablet to = 12.5 mg on Tues, Thurs, Sat, Sun at 5PM   midodrine 10 MG tablet Commonly known as:  PROAMATINE Take 10 mg by mouth every Monday, Wednesday, and Friday. On dialysis days   midodrine 5 MG tablet Commonly known as:  PROAMATINE Take 5 mg by mouth daily. Send 1 tab with patient to dialysis M-W-F to take mid treatment due to dropping BP   NEPHRO-VITE PO Take 1 tablet by mouth at bedtime.   OXYGEN Inhale 2 L/min into the lungs as needed.   pantoprazole 40 MG tablet Commonly known as:  PROTONIX Take 40 mg by mouth daily.   promethazine 25 MG tablet Commonly known as:  PHENERGAN Take 25 mg by mouth. Take 1 tablet daily prn and q8h prn   saccharomyces boulardii 250 MG capsule Commonly known as:  FLORASTOR Take 250 mg by  mouth 2 (two) times daily.   traMADol 50 MG tablet Commonly known as:  ULTRAM Take 50 mg by mouth every 8 (eight) hours as needed for severe pain.   UNABLE TO FIND Med Name: Preparation H CRM Aloe.  Apply to external hemorrhoids BID PRN.        Allergies  Allergen Reactions  . Erythromycin Swelling  . Erythromycin Base     Other reaction(s): GI Upset (intolerance)  . Penicillins Other (  See Comments)    Unknown allergic reaction.  She has taken keflex and Rocephin many times without problems  . Codeine Rash and Palpitations    Other reaction(s): GI Upset (intolerance)  . Penicillin G Rash   REVIEW OF SYSTEMS:    GENERAL: no fatigue, no weight changes, no fever, chills  RESPIRATORY: no SOB, DOE, wheezing, hemoptysis, +cough CARDIAC: no chest pain GI: no abdominal pain, diarrhea, constipation, heart burn, nausea or vomiting  PHYSICAL EXAMINATION  GENERAL: no acute distress SKIN:  Skin is warm and dry, upper lip with dried-up blister, left lateral heel with DTI and dressing NECK: supple, trachea midline, no neck masses, no thyroid tenderness, no thyromegaly LYMPHATICS: no LAN in the neck, no supraclavicular LAN RESPIRATORY: breathing is even & unlabored, BS CTAB, O2 @ 2L/min via [place]  CARDIAC: RRR, no murmur,no extra heart sounds, no edema GI: abdomen soft, normal BS, no masses, no tenderness, no hepatomegaly, no splenomegaly EXTREMITIES: Able to move all 4 extremities; Left upper arm AV fistula + for bruit and thrill PSYCHIATRIC: the patient is alert & oriented X 3, affect & behavior appropriate  LABS/RADIOLOGY: Labs reviewed: Basic Metabolic Panel:  Recent Labs  03/10/16 06/17/16 06/25/16  NA 143 144 142  K 5.0 4.1 4.2  BUN 30* 17 27*  CREATININE 4.7* 2.6* 3.7*   Liver Function Tests:  Recent Labs  03/10/16 06/17/16  AST 44* 31  ALT 34 24  ALKPHOS 153* 107    CBC:  Recent Labs  03/10/16 06/17/16 09/04/16  WBC 6.7 8.4 8.6  NEUTROABS _0 HGB 10.9*  8.5* 12.9  HCT 36 28* 43  PLT 156 197 179     ASSESSMENT/PLAN:  Fever blister - start Abreva 10% cream to lesion on upper lip 5X/day X 7 days  Cough - DC robitussin and start Mucinex DM 600-30 mg 1 PO Q 12 hours X 1 week  URI - continue Doxycycline 100 mg 1 PO  BID for a total of 7 days and Florastor 250 mg 1 capsule PO BID X 10 days     Durenda Age, NP [employer] [phone]

## 2017-01-07 DIAGNOSIS — N2581 Secondary hyperparathyroidism of renal origin: Secondary | ICD-10-CM | POA: Diagnosis not present

## 2017-01-07 DIAGNOSIS — D631 Anemia in chronic kidney disease: Secondary | ICD-10-CM | POA: Diagnosis not present

## 2017-01-07 DIAGNOSIS — N186 End stage renal disease: Secondary | ICD-10-CM | POA: Diagnosis not present

## 2017-01-07 DIAGNOSIS — E1122 Type 2 diabetes mellitus with diabetic chronic kidney disease: Secondary | ICD-10-CM | POA: Diagnosis not present

## 2017-01-07 DIAGNOSIS — D509 Iron deficiency anemia, unspecified: Secondary | ICD-10-CM | POA: Diagnosis not present

## 2017-01-09 DIAGNOSIS — D631 Anemia in chronic kidney disease: Secondary | ICD-10-CM | POA: Diagnosis not present

## 2017-01-09 DIAGNOSIS — N2581 Secondary hyperparathyroidism of renal origin: Secondary | ICD-10-CM | POA: Diagnosis not present

## 2017-01-09 DIAGNOSIS — E1122 Type 2 diabetes mellitus with diabetic chronic kidney disease: Secondary | ICD-10-CM | POA: Diagnosis not present

## 2017-01-09 DIAGNOSIS — D509 Iron deficiency anemia, unspecified: Secondary | ICD-10-CM | POA: Diagnosis not present

## 2017-01-09 DIAGNOSIS — N186 End stage renal disease: Secondary | ICD-10-CM | POA: Diagnosis not present

## 2017-01-12 DIAGNOSIS — N186 End stage renal disease: Secondary | ICD-10-CM | POA: Diagnosis not present

## 2017-01-12 DIAGNOSIS — D631 Anemia in chronic kidney disease: Secondary | ICD-10-CM | POA: Diagnosis not present

## 2017-01-12 DIAGNOSIS — E1122 Type 2 diabetes mellitus with diabetic chronic kidney disease: Secondary | ICD-10-CM | POA: Diagnosis not present

## 2017-01-12 DIAGNOSIS — D509 Iron deficiency anemia, unspecified: Secondary | ICD-10-CM | POA: Diagnosis not present

## 2017-01-12 DIAGNOSIS — N2581 Secondary hyperparathyroidism of renal origin: Secondary | ICD-10-CM | POA: Diagnosis not present

## 2017-01-14 DIAGNOSIS — N186 End stage renal disease: Secondary | ICD-10-CM | POA: Diagnosis not present

## 2017-01-14 DIAGNOSIS — R0989 Other specified symptoms and signs involving the circulatory and respiratory systems: Secondary | ICD-10-CM | POA: Diagnosis not present

## 2017-01-14 DIAGNOSIS — D631 Anemia in chronic kidney disease: Secondary | ICD-10-CM | POA: Diagnosis not present

## 2017-01-14 DIAGNOSIS — N2581 Secondary hyperparathyroidism of renal origin: Secondary | ICD-10-CM | POA: Diagnosis not present

## 2017-01-14 DIAGNOSIS — E1122 Type 2 diabetes mellitus with diabetic chronic kidney disease: Secondary | ICD-10-CM | POA: Diagnosis not present

## 2017-01-14 DIAGNOSIS — D509 Iron deficiency anemia, unspecified: Secondary | ICD-10-CM | POA: Diagnosis not present

## 2017-01-16 DIAGNOSIS — N186 End stage renal disease: Secondary | ICD-10-CM | POA: Diagnosis not present

## 2017-01-16 DIAGNOSIS — N2581 Secondary hyperparathyroidism of renal origin: Secondary | ICD-10-CM | POA: Diagnosis not present

## 2017-01-16 DIAGNOSIS — D509 Iron deficiency anemia, unspecified: Secondary | ICD-10-CM | POA: Diagnosis not present

## 2017-01-16 DIAGNOSIS — E1122 Type 2 diabetes mellitus with diabetic chronic kidney disease: Secondary | ICD-10-CM | POA: Diagnosis not present

## 2017-01-16 DIAGNOSIS — D631 Anemia in chronic kidney disease: Secondary | ICD-10-CM | POA: Diagnosis not present

## 2017-01-19 ENCOUNTER — Encounter (HOSPITAL_COMMUNITY): Payer: Self-pay | Admitting: Emergency Medicine

## 2017-01-19 ENCOUNTER — Emergency Department (HOSPITAL_COMMUNITY): Payer: Medicare Other

## 2017-01-19 ENCOUNTER — Inpatient Hospital Stay (HOSPITAL_COMMUNITY)
Admission: EM | Admit: 2017-01-19 | Discharge: 2017-02-03 | DRG: 871 | Disposition: A | Discharge status: Skilled Nursing Facility | Payer: Medicare Other | Attending: Internal Medicine | Admitting: Internal Medicine

## 2017-01-19 DIAGNOSIS — I120 Hypertensive chronic kidney disease with stage 5 chronic kidney disease or end stage renal disease: Secondary | ICD-10-CM | POA: Diagnosis not present

## 2017-01-19 DIAGNOSIS — L8990 Pressure ulcer of unspecified site, unspecified stage: Secondary | ICD-10-CM | POA: Diagnosis present

## 2017-01-19 DIAGNOSIS — J1008 Influenza due to other identified influenza virus with other specified pneumonia: Secondary | ICD-10-CM | POA: Diagnosis present

## 2017-01-19 DIAGNOSIS — A419 Sepsis, unspecified organism: Secondary | ICD-10-CM | POA: Diagnosis present

## 2017-01-19 DIAGNOSIS — Z6828 Body mass index (BMI) 28.0-28.9, adult: Secondary | ICD-10-CM

## 2017-01-19 DIAGNOSIS — E876 Hypokalemia: Secondary | ICD-10-CM | POA: Diagnosis present

## 2017-01-19 DIAGNOSIS — G8929 Other chronic pain: Secondary | ICD-10-CM | POA: Diagnosis present

## 2017-01-19 DIAGNOSIS — J9811 Atelectasis: Secondary | ICD-10-CM | POA: Diagnosis not present

## 2017-01-19 DIAGNOSIS — E1142 Type 2 diabetes mellitus with diabetic polyneuropathy: Secondary | ICD-10-CM | POA: Diagnosis not present

## 2017-01-19 DIAGNOSIS — R1312 Dysphagia, oropharyngeal phase: Secondary | ICD-10-CM | POA: Diagnosis not present

## 2017-01-19 DIAGNOSIS — E1165 Type 2 diabetes mellitus with hyperglycemia: Secondary | ICD-10-CM | POA: Diagnosis present

## 2017-01-19 DIAGNOSIS — Z9049 Acquired absence of other specified parts of digestive tract: Secondary | ICD-10-CM

## 2017-01-19 DIAGNOSIS — R0602 Shortness of breath: Secondary | ICD-10-CM | POA: Diagnosis not present

## 2017-01-19 DIAGNOSIS — R0902 Hypoxemia: Secondary | ICD-10-CM | POA: Diagnosis not present

## 2017-01-19 DIAGNOSIS — M109 Gout, unspecified: Secondary | ICD-10-CM | POA: Diagnosis present

## 2017-01-19 DIAGNOSIS — Z833 Family history of diabetes mellitus: Secondary | ICD-10-CM

## 2017-01-19 DIAGNOSIS — D638 Anemia in other chronic diseases classified elsewhere: Secondary | ICD-10-CM | POA: Diagnosis present

## 2017-01-19 DIAGNOSIS — R159 Full incontinence of feces: Secondary | ICD-10-CM | POA: Diagnosis present

## 2017-01-19 DIAGNOSIS — K219 Gastro-esophageal reflux disease without esophagitis: Secondary | ICD-10-CM | POA: Diagnosis present

## 2017-01-19 DIAGNOSIS — I10 Essential (primary) hypertension: Secondary | ICD-10-CM | POA: Diagnosis not present

## 2017-01-19 DIAGNOSIS — Z1623 Resistance to quinolones and fluoroquinolones: Secondary | ICD-10-CM | POA: Diagnosis present

## 2017-01-19 DIAGNOSIS — D649 Anemia, unspecified: Secondary | ICD-10-CM | POA: Diagnosis present

## 2017-01-19 DIAGNOSIS — E8889 Other specified metabolic disorders: Secondary | ICD-10-CM | POA: Diagnosis present

## 2017-01-19 DIAGNOSIS — N2581 Secondary hyperparathyroidism of renal origin: Secondary | ICD-10-CM | POA: Diagnosis present

## 2017-01-19 DIAGNOSIS — R7989 Other specified abnormal findings of blood chemistry: Secondary | ICD-10-CM

## 2017-01-19 DIAGNOSIS — Z7189 Other specified counseling: Secondary | ICD-10-CM | POA: Diagnosis not present

## 2017-01-19 DIAGNOSIS — L89002 Pressure ulcer of unspecified elbow, stage 2: Secondary | ICD-10-CM | POA: Diagnosis not present

## 2017-01-19 DIAGNOSIS — Y95 Nosocomial condition: Secondary | ICD-10-CM | POA: Diagnosis present

## 2017-01-19 DIAGNOSIS — L89152 Pressure ulcer of sacral region, stage 2: Secondary | ICD-10-CM | POA: Diagnosis present

## 2017-01-19 DIAGNOSIS — E43 Unspecified severe protein-calorie malnutrition: Secondary | ICD-10-CM | POA: Diagnosis present

## 2017-01-19 DIAGNOSIS — K580 Irritable bowel syndrome with diarrhea: Secondary | ICD-10-CM | POA: Diagnosis present

## 2017-01-19 DIAGNOSIS — I480 Paroxysmal atrial fibrillation: Secondary | ICD-10-CM | POA: Diagnosis not present

## 2017-01-19 DIAGNOSIS — N186 End stage renal disease: Secondary | ICD-10-CM | POA: Diagnosis present

## 2017-01-19 DIAGNOSIS — E785 Hyperlipidemia, unspecified: Secondary | ICD-10-CM | POA: Diagnosis present

## 2017-01-19 DIAGNOSIS — I953 Hypotension of hemodialysis: Secondary | ICD-10-CM | POA: Diagnosis not present

## 2017-01-19 DIAGNOSIS — J181 Lobar pneumonia, unspecified organism: Secondary | ICD-10-CM | POA: Diagnosis not present

## 2017-01-19 DIAGNOSIS — Z515 Encounter for palliative care: Secondary | ICD-10-CM | POA: Diagnosis not present

## 2017-01-19 DIAGNOSIS — E1122 Type 2 diabetes mellitus with diabetic chronic kidney disease: Secondary | ICD-10-CM | POA: Diagnosis present

## 2017-01-19 DIAGNOSIS — Z9013 Acquired absence of bilateral breasts and nipples: Secondary | ICD-10-CM

## 2017-01-19 DIAGNOSIS — Z7401 Bed confinement status: Secondary | ICD-10-CM

## 2017-01-19 DIAGNOSIS — R627 Adult failure to thrive: Secondary | ICD-10-CM | POA: Diagnosis present

## 2017-01-19 DIAGNOSIS — I132 Hypertensive heart and chronic kidney disease with heart failure and with stage 5 chronic kidney disease, or end stage renal disease: Secondary | ICD-10-CM | POA: Diagnosis present

## 2017-01-19 DIAGNOSIS — M549 Dorsalgia, unspecified: Secondary | ICD-10-CM | POA: Diagnosis present

## 2017-01-19 DIAGNOSIS — Z7982 Long term (current) use of aspirin: Secondary | ICD-10-CM

## 2017-01-19 DIAGNOSIS — Z853 Personal history of malignant neoplasm of breast: Secondary | ICD-10-CM

## 2017-01-19 DIAGNOSIS — Z992 Dependence on renal dialysis: Secondary | ICD-10-CM | POA: Diagnosis not present

## 2017-01-19 DIAGNOSIS — Z993 Dependence on wheelchair: Secondary | ICD-10-CM

## 2017-01-19 DIAGNOSIS — Z79899 Other long term (current) drug therapy: Secondary | ICD-10-CM

## 2017-01-19 DIAGNOSIS — E1151 Type 2 diabetes mellitus with diabetic peripheral angiopathy without gangrene: Secondary | ICD-10-CM | POA: Diagnosis present

## 2017-01-19 DIAGNOSIS — J189 Pneumonia, unspecified organism: Secondary | ICD-10-CM | POA: Diagnosis not present

## 2017-01-19 DIAGNOSIS — Z794 Long term (current) use of insulin: Secondary | ICD-10-CM

## 2017-01-19 DIAGNOSIS — A4101 Sepsis due to Methicillin susceptible Staphylococcus aureus: Secondary | ICD-10-CM | POA: Diagnosis not present

## 2017-01-19 DIAGNOSIS — R1310 Dysphagia, unspecified: Secondary | ICD-10-CM | POA: Diagnosis not present

## 2017-01-19 DIAGNOSIS — D631 Anemia in chronic kidney disease: Secondary | ICD-10-CM | POA: Diagnosis not present

## 2017-01-19 DIAGNOSIS — R945 Abnormal results of liver function studies: Secondary | ICD-10-CM

## 2017-01-19 DIAGNOSIS — R0603 Acute respiratory distress: Secondary | ICD-10-CM

## 2017-01-19 DIAGNOSIS — F419 Anxiety disorder, unspecified: Secondary | ICD-10-CM | POA: Diagnosis present

## 2017-01-19 DIAGNOSIS — R6520 Severe sepsis without septic shock: Secondary | ICD-10-CM | POA: Diagnosis not present

## 2017-01-19 DIAGNOSIS — L89309 Pressure ulcer of unspecified buttock, unspecified stage: Secondary | ICD-10-CM | POA: Diagnosis not present

## 2017-01-19 DIAGNOSIS — J15212 Pneumonia due to Methicillin resistant Staphylococcus aureus: Secondary | ICD-10-CM | POA: Diagnosis not present

## 2017-01-19 DIAGNOSIS — I5032 Chronic diastolic (congestive) heart failure: Secondary | ICD-10-CM | POA: Diagnosis present

## 2017-01-19 DIAGNOSIS — R531 Weakness: Secondary | ICD-10-CM | POA: Diagnosis not present

## 2017-01-19 DIAGNOSIS — A4102 Sepsis due to Methicillin resistant Staphylococcus aureus: Secondary | ICD-10-CM | POA: Diagnosis not present

## 2017-01-19 DIAGNOSIS — Z881 Allergy status to other antibiotic agents status: Secondary | ICD-10-CM

## 2017-01-19 DIAGNOSIS — T17990A Other foreign object in respiratory tract, part unspecified in causing asphyxiation, initial encounter: Secondary | ICD-10-CM | POA: Diagnosis not present

## 2017-01-19 DIAGNOSIS — R488 Other symbolic dysfunctions: Secondary | ICD-10-CM | POA: Diagnosis not present

## 2017-01-19 DIAGNOSIS — D509 Iron deficiency anemia, unspecified: Secondary | ICD-10-CM | POA: Diagnosis not present

## 2017-01-19 DIAGNOSIS — H8110 Benign paroxysmal vertigo, unspecified ear: Secondary | ICD-10-CM | POA: Diagnosis present

## 2017-01-19 DIAGNOSIS — R404 Transient alteration of awareness: Secondary | ICD-10-CM | POA: Diagnosis not present

## 2017-01-19 DIAGNOSIS — M6281 Muscle weakness (generalized): Secondary | ICD-10-CM | POA: Diagnosis not present

## 2017-01-19 DIAGNOSIS — Z9981 Dependence on supplemental oxygen: Secondary | ICD-10-CM

## 2017-01-19 DIAGNOSIS — Z880 Allergy status to penicillin: Secondary | ICD-10-CM

## 2017-01-19 DIAGNOSIS — Z8249 Family history of ischemic heart disease and other diseases of the circulatory system: Secondary | ICD-10-CM

## 2017-01-19 LAB — CBC WITH DIFFERENTIAL/PLATELET
Basophils Absolute: 0 10*3/uL (ref 0.0–0.1)
Basophils Relative: 0 %
EOS ABS: 0 10*3/uL (ref 0.0–0.7)
Eosinophils Relative: 0 %
HEMATOCRIT: 36.5 % (ref 36.0–46.0)
HEMOGLOBIN: 11.2 g/dL — AB (ref 12.0–15.0)
LYMPHS ABS: 3.8 10*3/uL (ref 0.7–4.0)
LYMPHS PCT: 31 %
MCH: 30.6 pg (ref 26.0–34.0)
MCHC: 30.7 g/dL (ref 30.0–36.0)
MCV: 99.7 fL (ref 78.0–100.0)
Monocytes Absolute: 0.5 10*3/uL (ref 0.1–1.0)
Monocytes Relative: 4 %
NEUTROS ABS: 8.1 10*3/uL — AB (ref 1.7–7.7)
NEUTROS PCT: 65 %
Platelets: 166 10*3/uL (ref 150–400)
RBC: 3.66 MIL/uL — AB (ref 3.87–5.11)
RDW: 17.6 % — ABNORMAL HIGH (ref 11.5–15.5)
WBC: 12.5 10*3/uL — AB (ref 4.0–10.5)

## 2017-01-19 LAB — I-STAT CG4 LACTIC ACID, ED
Lactic Acid, Venous: 1.83 mmol/L (ref 0.5–1.9)
Lactic Acid, Venous: 2.27 mmol/L (ref 0.5–1.9)

## 2017-01-19 LAB — COMPREHENSIVE METABOLIC PANEL
ALT: 158 U/L — ABNORMAL HIGH (ref 14–54)
ANION GAP: 11 (ref 5–15)
AST: 272 U/L — ABNORMAL HIGH (ref 15–41)
Albumin: 1.9 g/dL — ABNORMAL LOW (ref 3.5–5.0)
Alkaline Phosphatase: 149 U/L — ABNORMAL HIGH (ref 38–126)
BUN: 7 mg/dL (ref 6–20)
CHLORIDE: 95 mmol/L — AB (ref 101–111)
CO2: 30 mmol/L (ref 22–32)
Calcium: 7.5 mg/dL — ABNORMAL LOW (ref 8.9–10.3)
Creatinine, Ser: 1.88 mg/dL — ABNORMAL HIGH (ref 0.44–1.00)
GFR calc non Af Amer: 24 mL/min — ABNORMAL LOW (ref >60)
GFR, EST AFRICAN AMERICAN: 28 mL/min — AB (ref >60)
Glucose, Bld: 108 mg/dL — ABNORMAL HIGH (ref 65–99)
POTASSIUM: 3.9 mmol/L (ref 3.5–5.1)
SODIUM: 136 mmol/L (ref 135–145)
Total Bilirubin: 1.3 mg/dL — ABNORMAL HIGH (ref 0.3–1.2)
Total Protein: 6.7 g/dL (ref 6.5–8.1)

## 2017-01-19 LAB — PROTIME-INR
INR: 1.17
PROTHROMBIN TIME: 15 s (ref 11.4–15.2)

## 2017-01-19 MED ORDER — DEXTROSE 5 % IV SOLN
500.0000 mg | Freq: Two times a day (BID) | INTRAVENOUS | Status: DC
  Administered 2017-01-19: 500 mg via INTRAVENOUS
  Filled 2017-01-19 (×2): qty 0.5

## 2017-01-19 MED ORDER — SODIUM CHLORIDE 0.9 % IV BOLUS (SEPSIS)
1000.0000 mL | Freq: Once | INTRAVENOUS | Status: AC
  Administered 2017-01-19: 1000 mL via INTRAVENOUS

## 2017-01-19 MED ORDER — SODIUM CHLORIDE 0.9 % IV BOLUS (SEPSIS)
250.0000 mL | Freq: Once | INTRAVENOUS | Status: AC
  Administered 2017-01-19: 250 mL via INTRAVENOUS

## 2017-01-19 MED ORDER — DEXTROSE 5 % IV SOLN: 2.0000 g | Freq: Once | INTRAVENOUS | Status: DC

## 2017-01-19 MED ORDER — OSELTAMIVIR PHOSPHATE 75 MG PO CAPS
75.0000 mg | ORAL_CAPSULE | Freq: Once | ORAL | Status: AC
  Administered 2017-01-19: 75 mg via ORAL
  Filled 2017-01-19: qty 1

## 2017-01-19 MED ORDER — VANCOMYCIN HCL IN DEXTROSE 1-5 GM/200ML-% IV SOLN: 1000.0000 mg | Freq: Once | INTRAVENOUS | Status: DC

## 2017-01-19 MED ORDER — VANCOMYCIN HCL IN DEXTROSE 750-5 MG/150ML-% IV SOLN
750.0000 mg | INTRAVENOUS | Status: DC
  Administered 2017-01-21: 750 mg via INTRAVENOUS
  Filled 2017-01-19 (×3): qty 150

## 2017-01-19 MED ORDER — VANCOMYCIN HCL 10 G IV SOLR
1500.0000 mg | Freq: Once | INTRAVENOUS | Status: AC
  Administered 2017-01-19: 1500 mg via INTRAVENOUS
  Filled 2017-01-19: qty 1500

## 2017-01-19 MED ORDER — DEXTROSE 5 % IV SOLN
500.0000 mg | Freq: Once | INTRAVENOUS | Status: AC
  Administered 2017-01-19: 500 mg via INTRAVENOUS
  Filled 2017-01-19: qty 500

## 2017-01-19 NOTE — Progress Notes (Addendum)
Pharmacy Antibiotic Note  [name] James is a 81 y.o. female admitted on 01/19/2017 with pneumonia. Failed outpatient treatment with doxycycline and moxifloxacin.  Pharmacy has been consulted for vancomycin and aztreonam dosing. She is ESRD on HD MWF, last full session today.  Vancomycin pre-HD level 15-25  Plan: 1) Vancomycin 1500mg  IV x 1 then 750mg  IV qHD MWF x 8 days 2) Aztreonam 500mg  IV q12 3) Follow HD schedule/tolerance, cultures, LOT,level if needed  Addendum: Aztreonam changed to Cefepime by admitting MD. Cefepime 1gm IV q24h x 8 days  Sherlon Handing, PharmD, BCPS Clinical pharmacist, pager [phone] 01/20/2017 1:43 AM  Height: 5\' 3"  (160 cm) IBW/kg (Calculated) : 52.4  Temp (24hrs), Avg:97.9 F (36.6 C), Min:97.9 F (36.6 C), Max:97.9 F (36.6 C)   Recent Labs Lab 01/19/17 1725 01/19/17 1744 01/19/17 2127  WBC 12.5*  --   --   CREATININE 1.88*  --   --   LATICACIDVEN  --  1.83 2.27*    Estimated Creatinine Clearance: 23.4 mL/min (by C-G formula based on SCr of 1.88 mg/dL (H)).    Allergies  Allergen Reactions  . Erythromycin Swelling  . Erythromycin Base     Other reaction(s): GI Upset (intolerance)  . Penicillins Other (See Comments)    Unknown allergic reaction.  She has taken keflex and Rocephin many times without problems  . Codeine Rash and Palpitations    Other reaction(s): GI Upset (intolerance)  . Penicillin G Rash    Antimicrobials this admission: 1/29 Vancomycin >> 1/29 Aztreonam >>  Dose adjustments this admission: n/a  Microbiology results: 1/29 blood x2 >>  Thank you for allowing pharmacy to be a part of this patient's care.  Deboraha Sprang 01/19/2017 9:50 PM

## 2017-01-19 NOTE — ED Provider Notes (Addendum)
Complains of general malaise and cough for the past 2 weeks. Has been treated as outpatient with for pneumonia, or feels no better she also reports vomiting one time today howevers he is not nauseated now and on exam chronically ill-appearing lung exam with [place] left base. Chest x-ray viewed by me   Orlie Dakin, MD 01/19/17 [place], MD 01/19/17 2205

## 2017-01-19 NOTE — ED Notes (Signed)
PA at bedside.

## 2017-01-19 NOTE — H&P (Signed)
History and Physical  Patient Name: [name] [facility]     XMI:[id number]    DOB: [birth year]    DOA: 01/19/2017 PCP: [name], MD   Patient coming from: [place]  Chief Complaint: Cough  HPI: [name] [facility] is a 81 y.o. female with a past medical history significant for ESRD on HD MWF, IDDM, HTN, Afib not on warfarin because of bleeding risk who presents with cough.  The patient first developed fever/chills and cough productive of yellowish/green sputum about 3 weeks ago.  Was evaluated at her facility and prescribed robitussin and doxycycline, CXR was normal per daughter, as was lab work.  Then about 1 week ago, she was re-evaluated in the facility, had a CXR that showed a LL opacity, was given Avelox for pneumonia, had reassuring lab work and eval, and seemed to be improving for several days.  Then this morning, daughter called her and her mother sounded "different" and clearly worse.  After her routine HD, she complained of malaise, worsening cough, severe weakness, and thick yellow sputum, and so her daughter brought her to the ER.  ED course: -Temp 97.49F, heart rate 72, respirations 20-24, blood pressure 85/41, pulse oximetry mid 90s on 2 L nasal cannula -Na 136, K 3.9, Cr 1.88 (baseline 2), WBC 12.5K, Hgb 11.2  -INR normal -Lactic acid 1.8 --> 2.2 -CXR still showed a left base opacity -She was given vancomycin and Azactam and TRH were asked to evaluate for HCAP     ROS: Review of Systems  Constitutional: Positive for malaise/fatigue.  Respiratory: Positive for cough and sputum production.   Neurological: Positive for weakness.  All other systems reviewed and are negative.         Past Medical History:  Diagnosis Date  . Anemia, chronic disease   . Anxiety   . Arthritis    knees  . Atrial fibrillation ([place])   . Barrett esophagus   . Benign paroxysmal positional vertigo   . Breast cancer ([place]) 1980s   bil mastectomies, no radiation or chemo  . Cataract   . Chest  pain, atypical 12/05/2008   R/Lmv- normal perfusion all regions, noe ECG changes   . CHF (congestive heart failure) ([place]) 05/13/2011   echo - MG>50%; stage 1 diastolic dysfunction; elevated LV filling pressure, MAC  . CKD (chronic kidney disease), stage III   . Claudication ([place]) 10/30/2005   doppler - normal evaluation, no evidence of aneurysm, diameter reduction, dissection, compression or vascular abnormality)  . Complication of anesthesia   . Dyslipidemia   . Dysrhythmia    Atrial Fibrillation  . Edema   . ESRD (end stage renal disease) ([place])   . Family history of adverse reaction to anesthesia    son also has nausea   . Fever blister 12/2016  . Gastritis and gastroduodenitis   . GERD (gastroesophageal reflux disease)   . Gout   . Hemodialysis-associated hypotension   . HLD (hyperlipidemia)   . Hypertension   . IBS (irritable bowel syndrome)   . Insomnia   . Morbid obesity ([place])   . PAF (paroxysmal atrial fibrillation) ([place])   . Peripheral neuropathy ([place])   . PONV (postoperative nausea and vomiting)   . Renal cyst   . Renal insufficiency 10/30/2005   doppler - abn resistance consistent w/ parenchymal disease  . Renovascular hypertension   . Type 2 diabetes mellitus with diabetic neuropathy, with long-term current use of insulin ([place])   . Vertigo     Past Surgical History:  Procedure Laterality Date  . ANGIOPLASTY Left 02/12/2015   Procedure: ANGIOPLASTY;  Surgeon: Angelia Mould, MD;  Location: [facility] CATH LAB;  Service: Cardiovascular;  Laterality: Left;  AVF  . AV FISTULA PLACEMENT Left 11/30/2014   Procedure: ARTERIOVENOUS (AV) FISTULA CREATION LEFT ARM;  Surgeon: Angelia Mould, MD;  Location: [place];  Service: Vascular;  Laterality: Left;  . AV FISTULA PLACEMENT Left 03/20/2015   Procedure:  Inserton of Left Upper Arm Gortex Graft;  Surgeon: Angelia Mould, MD;  Location: [facility] [place];  Service: Vascular;  Laterality: Left;  . AV FISTULA PLACEMENT Left  04/12/2015   Procedure: INSERTION OF LEFT ARM  ARTERIOVENOUS GORE-TEX GRAFT ;  Surgeon: Angelia Mould, MD;  Location: [place];  Service: Vascular;  Laterality: Left;  . BACK SURGERY  ~1980  . CHOLECYSTECTOMY    . ESOPHAGOGASTRODUODENOSCOPY N/A 10/24/2014   Procedure: ESOPHAGOGASTRODUODENOSCOPY (EGD);  Surgeon: Jerene Bears, MD;  Location: [facility] ENDOSCOPY;  Service: Endoscopy;  Laterality: N/A;  . EXCHANGE OF A DIALYSIS CATHETER Left 11/30/2014   Procedure: EXCHANGE OF A DIALYSIS CATHETER, LEFT INTERNAL JUGULAR;  Surgeon: Angelia Mould, MD;  Location: [place];  Service: Vascular;  Laterality: Left;  . EYE SURGERY    . FISTULOGRAM N/A 02/12/2015   Procedure: FISTULOGRAM;  Surgeon: Angelia Mould, MD;  Location: [facility] CATH LAB;  Service: Cardiovascular;  Laterality: N/A;  . HEMORRHOID SURGERY    . LIGATION OF ARTERIOVENOUS  FISTULA Left 03/20/2015   Procedure: LIGATION OF ARTERIOVENOUS  FISTULA;  Surgeon: Angelia Mould, MD;  Location: [place];  Service: Vascular;  Laterality: Left;  Marland Kitchen MASTECTOMY Bilateral ~1982   bil breast reconstruction with implants  . ORIF TIBIA & FIBULA FRACTURES Left 2007   also had left non displaced malleolar fracture.   Marland Kitchen REMOVAL OF GRAFT Left 04/12/2015   Procedure: REMOVAL OF LEFT ARM ARTERIOVENOUS GORE-TEX GRAFT;  Surgeon: Angelia Mould, MD;  Location: [place];  Service: Vascular;  Laterality: Left;    Social History: Patient lives at [name].  The patient is wheelchari bound for 2 years.  Her daughter is an Therapist, sports.  She is a never smoker.    Allergies  Allergen Reactions  . Erythromycin Swelling  . Erythromycin Base     Other reaction(s): GI Upset (intolerance)  . Penicillins Other (See Comments)    Unknown allergic reaction.  She has taken keflex and Rocephin many times without problems  . Codeine Rash and Palpitations    Other reaction(s): GI Upset (intolerance)  . Penicillin G Rash    Family history: family history includes Cancer  in her mother and sister; Diabetes in her mother; Heart attack in her father; Heart disease in her mother; Parkinson's disease in her brother; Stroke in her father.  Prior to Admission medications   Medication Sig Start Date End Date Taking? Authorizing Provider  acetaminophen (TYLENOL) 325 MG tablet Take 650 mg by mouth every 4 (four) hours as needed for headache (muscle pain).     Historical Provider, MD  albuterol (PROVENTIL) (2.5 MG/3ML) 0.083% nebulizer solution Take 2.5 mg by nebulization. 6AM, 2PM, 10PM x1 week    Historical Provider, MD  allopurinol (ZYLOPRIM) 100 MG tablet Take 100 mg by mouth daily.     Historical Provider, MD  amiodarone (PACERONE) 200 MG tablet Take 200 mg by mouth daily.  06/08/15   [name], MD  aspirin 325 MG EC tablet Take 325 mg by mouth daily.    Historical Provider, MD  atorvastatin (LIPITOR) 80 MG tablet Take 1 tablet (80 mg total) by mouth daily at 6 PM. 12/22/14   Geradine Girt, DO  B Complex-C-Folic Acid (NEPHRO-VITE PO) Take 1 tablet by mouth at bedtime.     Historical Provider, MD  cetirizine (ZYRTEC) 10 MG tablet Take 5 mg by mouth at bedtime. Take 1/2 tablet to = 5 mg qd     Historical Provider, MD  dextromethorphan-guaiFENesin (MUCINEX DM) 30-600 MG 12hr tablet Take 1 tablet by mouth 2 (two) times daily. X 1 week    Historical Provider, MD  diazepam (VALIUM) 5 MG tablet Take one tablet by mouth every night at bedtime for rest 09/09/16   Lauree Chandler, NP  dicyclomine (BENTYL) 20 MG tablet Take 1 tablet (20 mg total) by mouth 3 (three) times daily. 06/05/15   Jerene Bears, MD  Docosanol (ABREVA) 10 % CREA Apply 1 application topically. Apply to upper lip lesion 5 times a day for 7 days    Historical Provider, MD  dorzolamide-timolol (COSOPT) 22.3-6.8 MG/ML ophthalmic solution Place 1 drop into the left eye 2 (two) times daily. Wait 2-5 minutes when giving two eye meds    Historical Provider, MD  fluticasone (FLONASE ALLERGY RELIEF) 50 MCG/ACT nasal  spray Place 2 sprays into both nostrils daily.    Historical Provider, MD  folic acid (FOLVITE) 1 MG tablet Take 1 mg by mouth daily.    Historical Provider, MD  lanthanum (FOSRENOL) 1000 MG chewable tablet Chew 1,000 mg by mouth 3 (three) times daily with meals.    Historical Provider, MD  loperamide (IMODIUM A-D) 2 MG tablet Take 2 mg by mouth 3 (three) times daily. May take first dose and refuse other two if not needed    Historical Provider, MD  meclizine (ANTIVERT) 12.5 MG tablet Take 12.5 mg by mouth 2 (two) times daily as needed for dizziness.     Historical Provider, MD  Melatonin 5 MG TABS Take 5 mg by mouth at bedtime.     Historical Provider, MD  metoprolol tartrate (LOPRESSOR) 25 MG tablet Take 12.5-25 mg by mouth. Take 25 mg Tues, Thurs, Sat, Sun at [place].  Take 1/2 tablet to = 12.5 mg on Tues, Thurs, Sat, Sun at [facility], MD  midodrine (PROAMATINE) 10 MG tablet Take 10 mg by mouth every Monday, Wednesday, and Friday. On dialysis days    Historical Provider, MD  midodrine (PROAMATINE) 5 MG tablet Take 5 mg by mouth daily. Send 1 tab with patient to dialysis M-W-F to take mid treatment due to dropping BP    Historical Provider, MD  OXYGEN Inhale 2 L/min into the lungs as needed.     Historical Provider, MD  pantoprazole (PROTONIX) 40 MG tablet Take 40 mg by mouth daily.     Historical Provider, MD  promethazine (PHENERGAN) 25 MG tablet Take 25 mg by mouth. Take 1 tablet daily prn and q8h prn    Historical Provider, MD  traMADol (ULTRAM) 50 MG tablet Take 50 mg by mouth every 8 (eight) hours as needed for severe pain.    Historical Provider, MD  UNABLE TO FIND Med Name: Preparation H CRM Aloe.  Apply to external hemorrhoids BID PRN.    Historical Provider, MD       Physical Exam: BP (!) 118/41   Pulse 66   Temp 97.9 F (36.6 C) (Oral)   Resp 15   Ht 5' 3"  (1.6 m)   SpO2 100%  General appearance: Frail elderly adult female, alert and in no acute distress, coughing  with rattling cough.   Eyes: Anicteric, conjunctiva pink, lids and lashes normal. PERRL.    ENT: No nasal deformity, discharge, epistaxis.  Hearing slightly diminished. OP moist without lesions.   Neck: No neck masses.  Trachea midline.  No thyromegaly/tenderness. Lymph: No cervical or supraclavicular lymphadenopathy. Skin: Warm and dry.  No jaundice.  No suspicious rashes or lesions. Cardiac: RRR, nl S1-S2, no murmurs appreciated.  Capillary refill is brisk.  JVP not visible.  No LE edema.  Radial and DP pulses 2+ and symmetric. Respiratory: Incrased respiratory rate.  Rattling cough.  Diminished air movement.  No wheezes. Abdomen: Abdomen soft.  No TTP. No ascites, distension, hepatosplenomegaly.   MSK: No deformities or effusions.  No cyanosis or clubbing. Neuro: Cranial nerves grossly normal.  Sensation intact to light touch. Speech is fluent.  Muscle strength globally weak.    Psych: Sensorium intact and responding to questions, attention normal.  Behavior appropriate.  Affect blunted.  Judgment and insight appear poor.     Labs on Admission:  I have personally reviewed following labs and imaging studies: CBC:  Recent Labs Lab 01/19/17 1725  WBC 12.5*  NEUTROABS 8.1*  HGB 11.2*  HCT 36.5  MCV 99.7  PLT 157   Basic Metabolic Panel:  Recent Labs Lab 01/19/17 1725  NA 136  K 3.9  CL 95*  CO2 30  GLUCOSE 108*  BUN 7  CREATININE 1.88*  CALCIUM 7.5*   GFR: Estimated Creatinine Clearance: 23.4 mL/min (by C-G formula based on SCr of 1.88 mg/dL (H)).  Liver Function Tests:  Recent Labs Lab 01/19/17 1725  AST 272*  ALT 158*  ALKPHOS 149*  BILITOT 1.3*  PROT 6.7  ALBUMIN 1.9*   No results for input(s): LIPASE, AMYLASE in the last 168 hours. No results for input(s): AMMONIA in the last 168 hours. Coagulation Profile:  Recent Labs Lab 01/19/17 1725  INR 1.17   Cardiac Enzymes: No results for input(s): CKTOTAL, CKMB, CKMBINDEX, TROPONINI in the last 168  hours. BNP (last 3 results) No results for input(s): PROBNP in the last 8760 hours. HbA1C: No results for input(s): HGBA1C in the last 72 hours. CBG: No results for input(s): GLUCAP in the last 168 hours. Lipid Profile: No results for input(s): CHOL, HDL, LDLCALC, TRIG, CHOLHDL, LDLDIRECT in the last 72 hours. Thyroid Function Tests: No results for input(s): TSH, T4TOTAL, FREET4, T3FREE, THYROIDAB in the last 72 hours. Anemia Panel: No results for input(s): VITAMINB12, FOLATE, FERRITIN, TIBC, IRON, RETICCTPCT in the last 72 hours. Sepsis Labs: Lactic acid 2.2 Invalid input(s): PROCALCITONIN, LACTICIDVEN No results found for this or any previous visit (from the past 240 hour(s)).       Radiological Exams on Admission: Personally reviewed CXR shows new left base opacity: Dg Chest Port 1 View  Result Date: 01/19/2017 CLINICAL DATA:  Pneumonia for 3 weeks, not improved on antibiotics. EXAM: PORTABLE CHEST 1 VIEW COMPARISON:  12/18/2017 FINDINGS: Left base consolidation, consistent with pneumonia. Right lung is clear. Probable small left pleural effusion. Pulmonary vasculature is normal. Hilar, mediastinal and cardiac contours are unremarkable. IMPRESSION: Left base consolidation and probable small effusion, consistent with pneumonia. Followup PA and lateral chest X-ray is recommended in 3-4 weeks following trial of antibiotic therapy to ensure resolution and exclude underlying malignancy. Electronically Signed   By: Andreas Newport M.D.   On: 01/19/2017 21:28    EKG: Independently reviewed. Rate 64, QTc 490, no  ST changes.    Assessment/Plan  1. Sepsis from pneumonia:  Suspected source HCAP and influenza. Organism unknown.   Patient meets criteria given tachypnea, leukocytosis, and evidence of organ dysfunction.  Lactate 2.2 mmol/L and repeat ordered within 6 hours.  This patient is at high risk of poor outcomes with a qSOFA score of 2.  Antibiotics delivered in the ED.    -Sepsis  bundle utilized:  -Blood and urine cultures drawn  -30 ml/kg bolus given in ED, will repeat lactic acid  -Antibiotics: vancomycin and cefepime  -Will defer atypical coverage because recent doxycycline/appropriate treatment  -Oseltamivir 75 mg given in ER, if flu positive, will need oseltamivir after HD  -Repeat renal function and complete blood count in AM  -Code SEPSIS called to E-link  -Sputum culture to be obtained  -Strep pneumo and legionella antigens  -Trend procalcitonin      2. Hypertension:  On metoprolol daily, midodrine for intradialytic hypotension -Hold metoprolol until hemodynamics clearer  3. ESRD on HD MWF:  -Continue Lanthanum -Continue midodrine with dialysis  4. Pressure ulcer:  -Consult WOC  5. Diabetes:  Not on antidiabetics.  6. pAF:  CHADS2Vasc 7.  Not on warfarin because of risk of bleeding. -Continue amiodarone -Continue full aspirin  7. Other medications:  -Continue allopurinol and PPI  8. Anemia: Stable at baseline  9. Chronic diastolic CHF: Currently, seems euvolemic. -Strict I/Os  10. Elevated LFTs: No abdominal pain or nausea.  Presumably from sepsis/influenza. -Trend LFTs and will image abdomen if worsening       DVT prophylaxis: Lovenox  Code Status: FULL  Family Communication: Daughter at bedside, CODE STATUS confirmed.  Disposition Plan: Anticipate IV fulids and antibiotics and treatment of sepsis. Consults called: None overnight Admission status: INPATIENT    Medical decision making: Patient seen at 11:15 PM on 01/19/2017.  The patient was discussed with Melina Schools, PA-C.  What exists of the patient's chart was reviewed in depth and summarized above.  Clinical condition: BP remaining tsable, patient mentating well.        Edwin Dada [facility] Pager [phone]    At the time of admission, it appears that the appropriate admission status for this patient is INPATIENT. This is judged to be  reasonable and necessary in order to provide the required intensity of service to ensure the patient's safety given the presenting symptoms, physical exam findings, and initial radiographic and laboratory data in the context of their chronic comorbidities.  Together, these circumstances are felt to place her at high risk for further clinical deterioration threatening life, limb, or organ.   Patient requires inpatient status due to high intensity of service, high risk for further deterioration and high frequency of surveillance required because of this acute illness that poses a threat to life, limb or bodily function.  Factors taht support Inpatient status include: History of ESRD  History of Atrial fibrillation History of insulin dependent diabetes Presentation with hypotenision, elevated lactic acid, new infiltrate on CXR and leukocytosis consistent with sepsis from pneumonia  I certify that at the point of admission it is my clinical judgment that the patient will require inpatient [facility] care spanning beyond 2 midnights from the point of admission and that early discharge would result in unnecessary risk of decompensation and readmission or threat to life, limb or bodily function.

## 2017-01-19 NOTE — ED Triage Notes (Signed)
Per [facility]: Pt to ED from dialysis (fully completed today) - sent by NP there for further evaluation of the flu. Pt already tested negative for Influenza A but has pneumonia and URI x 3 weeks - taken doxycyline and Avlox without improvement. Pt resident at [facility]. Pt c/o chest soreness with cough and pain in buttocks secondary to decubitus ulcer. EMS VS: 130/46, HR 65 regular, RR 18, 99% 2L O2 (always on 2L [place]), CBG 120. Pt A&O per baseline. Resp currently e/u. Pt states she "just doesn't feel well."

## 2017-01-19 NOTE — ED Notes (Signed)
Antibiotics delay due to waiting on pharmacy preparation.

## 2017-01-19 NOTE — ED Provider Notes (Signed)
[facility] DEPT Provider Note   CSN: [id number] Arrival date & time: 01/19/17  1644     History   Chief Complaint Chief Complaint  Patient presents with  . Pneumonia    HPI [name] James is a 81 y.o. female.  HPI  81 year old Caucasian female past medical history significant for ESRD on hemodialysis Monday Wednesdays and Fridays, hypertension, A. fib not on warfarin that presents to the ED today with complaint of cough. Patient presents by EMS from nursing facility with her daughter at bedside. Patient states that she first developed fever and a productive cough with yellow to green sputum approximately 3 weeks ago. She was diagnosed with a URI. She was given Robitussin and doxycycline. Patient states that her symptoms were slightly improved. However week ago her cough persisted. She had a chest x-ray at that time. Do not have imaging or reports for review. Daughter at bedside states that showed a left lower lobe opacity concerning for pneumonia. She was put on Avelox. Patient did have reassuring lab work. She has been on Avelox for 7 days. Finished her last dose of Avelox yesterday. States that she was feeling fine yesterday and felt improved. However this morning when she went to dialysis patient stated she felt worse. She spoke with her daughter and her daughter was concerned that she sounded different. What hurts into the ED for evaluation. Patient did complete her hemodialysis. She had increased thick yellow sputum production, malaise, severe weakness. Patient denies any chest pain or shortness of breath. She denies any fever, chills, headache, vision changes, lightheadedness, dizziness, bowel pain, nausea, emesis, urinary symptoms, change in bowel habits, numbness/tingling.     Past Medical History:  Diagnosis Date  . Anemia, chronic disease   . Anxiety   . Arthritis    knees  . Atrial fibrillation ([place])   . Barrett esophagus   . Benign paroxysmal positional vertigo   .  Breast cancer ([place]) 1980s   bil mastectomies, no radiation or chemo  . Cataract   . Chest pain, atypical 12/05/2008   R/Lmv- normal perfusion all regions, noe ECG changes   . CHF (congestive heart failure) ([place]) 05/13/2011   echo - ZS>01%; stage 1 diastolic dysfunction; elevated LV filling pressure, MAC  . CKD (chronic kidney disease), stage III   . Claudication ([place]) 10/30/2005   doppler - normal evaluation, no evidence of aneurysm, diameter reduction, dissection, compression or vascular abnormality)  . Complication of anesthesia   . Dyslipidemia   . Dysrhythmia    Atrial Fibrillation  . Edema   . ESRD (end stage renal disease) ([place])   . Family history of adverse reaction to anesthesia    son also has nausea   . Fever blister 12/2016  . Gastritis and gastroduodenitis   . GERD (gastroesophageal reflux disease)   . Gout   . Hemodialysis-associated hypotension   . HLD (hyperlipidemia)   . Hypertension   . IBS (irritable bowel syndrome)   . Insomnia   . Morbid obesity ([place])   . PAF (paroxysmal atrial fibrillation) ([place])   . Peripheral neuropathy ([place])   . PONV (postoperative nausea and vomiting)   . Renal cyst   . Renal insufficiency 10/30/2005   doppler - abn resistance consistent w/ parenchymal disease  . Renovascular hypertension   . Type 2 diabetes mellitus with diabetic neuropathy, with long-term current use of insulin ([place])   . Vertigo     Patient Active Problem List   Diagnosis Date Noted  . Long  term current use of amiodarone 10/17/2016  . History of pressure ulcer 10/17/2016  . GAD (generalized anxiety disorder) 10/17/2016  . Osteopenia determined by x-ray 04/17/2016  . Controlled type 2 diabetes mellitus with diabetic polyneuropathy, with long-term current use of insulin ([place]) 04/17/2016  . Type 2 diabetes mellitus with diabetic neuropathy ([place]) 06/08/2015  . Type 2 diabetes, controlled, with renal manifestation ([place]) 06/08/2015  . PVD (peripheral vascular disease)  ([place]) 06/08/2015  . Swelling of limb-Left arm 04/10/2015  . Nausea with vomiting 01/12/2015  . AP (abdominal pain) 01/12/2015  . Normocytic anemia 01/12/2015  . Atrial fibrillation ([place]) 12/22/2014  . End stage renal disease ([place])   . Melena   . ESRD on dialysis ([place]) 12/19/2014  . CHF (congestive heart failure) ([place]) 12/19/2014  . Chest pain 12/19/2014  . Palpitation 12/19/2014  . ESRD (end stage renal disease) ([place]) 12/19/2014  . Anemia, chronic disease 12/13/2014  . Hemodialysis-associated hypotension 12/13/2014  . Acute renal failure syndrome ([place])   . Pulmonary edema 11/10/2014  . Physical deconditioning 10/29/2014  . Iron deficiency anemia   . Acute esophagitis 10/24/2014  . Gastritis and gastroduodenitis 10/24/2014  . Heme positive stool 10/24/2014  . Acute encephalopathy 10/21/2014  . Morbid obesity ([place]) 10/18/2014  . Edema 10/18/2014  . AKI (acute kidney injury) ([place]) 10/17/2014  . Acute on chronic diastolic CHF (congestive heart failure) ([place]) 10/17/2014  . HTN (hypertension) 10/17/2014  . CKD (chronic kidney disease), stage III 10/17/2014  . DM type 2, uncontrolled, with renal complications ([place]) 41/28/7867  . Dyslipidemia 10/17/2014  . Anemia 10/17/2014    Past Surgical History:  Procedure Laterality Date  . ANGIOPLASTY Left 02/12/2015   Procedure: ANGIOPLASTY;  Surgeon: Angelia Mould, MD;  Location: [facility] CATH LAB;  Service: Cardiovascular;  Laterality: Left;  AVF  . AV FISTULA PLACEMENT Left 11/30/2014   Procedure: ARTERIOVENOUS (AV) FISTULA CREATION LEFT ARM;  Surgeon: Angelia Mould, MD;  Location: [facility];  Service: Vascular;  Laterality: Left;  . AV FISTULA PLACEMENT Left 03/20/2015   Procedure:  Inserton of Left Upper Arm Gortex Graft;  Surgeon: Angelia Mould, MD;  Location: [facility];  Service: Vascular;  Laterality: Left;  . AV FISTULA PLACEMENT Left 04/12/2015   Procedure: INSERTION OF LEFT ARM  ARTERIOVENOUS GORE-TEX GRAFT ;  Surgeon:  Angelia Mould, MD;  Location: [facility];  Service: Vascular;  Laterality: Left;  . BACK SURGERY  ~1980  . CHOLECYSTECTOMY    . ESOPHAGOGASTRODUODENOSCOPY N/A 10/24/2014   Procedure: ESOPHAGOGASTRODUODENOSCOPY (EGD);  Surgeon: Jerene Bears, MD;  Location: [facility] ENDOSCOPY;  Service: Endoscopy;  Laterality: N/A;  . EXCHANGE OF A DIALYSIS CATHETER Left 11/30/2014   Procedure: EXCHANGE OF A DIALYSIS CATHETER, LEFT INTERNAL JUGULAR;  Surgeon: Angelia Mould, MD;  Location: [facility];  Service: Vascular;  Laterality: Left;  . EYE SURGERY    . FISTULOGRAM N/A 02/12/2015   Procedure: FISTULOGRAM;  Surgeon: Angelia Mould, MD;  Location: [facility] CATH LAB;  Service: Cardiovascular;  Laterality: N/A;  . HEMORRHOID SURGERY    . LIGATION OF ARTERIOVENOUS  FISTULA Left 03/20/2015   Procedure: LIGATION OF ARTERIOVENOUS  FISTULA;  Surgeon: Angelia Mould, MD;  Location: [facility];  Service: Vascular;  Laterality: Left;  Marland Kitchen MASTECTOMY Bilateral ~1982   bil breast reconstruction with implants  . ORIF TIBIA & FIBULA FRACTURES Left 2007   also had left non displaced malleolar fracture.   Marland Kitchen REMOVAL OF GRAFT Left 04/12/2015   Procedure: REMOVAL OF LEFT ARM  ARTERIOVENOUS GORE-TEX GRAFT;  Surgeon: Angelia Mould, MD;  Location: [facility];  Service: Vascular;  Laterality: Left;    OB History    No data available       Home Medications    Prior to Admission medications   Medication Sig Start Date End Date Taking? Authorizing Provider  acetaminophen (TYLENOL) 325 MG tablet Take 650 mg by mouth every 4 (four) hours as needed for headache (muscle pain).     Historical Provider, MD  albuterol (PROVENTIL) (2.5 MG/3ML) 0.083% nebulizer solution Take 2.5 mg by nebulization. 6AM, 2PM, 10PM x1 week    Historical Provider, MD  allopurinol (ZYLOPRIM) 100 MG tablet Take 100 mg by mouth daily.     Historical Provider, MD  amiodarone (PACERONE) 200 MG tablet Take 200 mg by mouth daily.  06/08/15   [name], MD    aspirin 325 MG EC tablet Take 325 mg by mouth daily.    Historical Provider, MD  atorvastatin (LIPITOR) 80 MG tablet Take 1 tablet (80 mg total) by mouth daily at 6 PM. 12/22/14   Geradine Girt, DO  B Complex-C-Folic Acid (NEPHRO-VITE PO) Take 1 tablet by mouth at bedtime.     Historical Provider, MD  cetirizine (ZYRTEC) 10 MG tablet Take 5 mg by mouth at bedtime. Take 1/2 tablet to = 5 mg qd     Historical Provider, MD  dextromethorphan-guaiFENesin (MUCINEX DM) 30-600 MG 12hr tablet Take 1 tablet by mouth 2 (two) times daily. X 1 week    Historical Provider, MD  diazepam (VALIUM) 5 MG tablet Take one tablet by mouth every night at bedtime for rest 09/09/16   Lauree Chandler, NP  dicyclomine (BENTYL) 20 MG tablet Take 1 tablet (20 mg total) by mouth 3 (three) times daily. 06/05/15   Jerene Bears, MD  Docosanol (ABREVA) 10 % CREA Apply 1 application topically. Apply to upper lip lesion 5 times a day for 7 days    Historical Provider, MD  dorzolamide-timolol (COSOPT) 22.3-6.8 MG/ML ophthalmic solution Place 1 drop into the left eye 2 (two) times daily. Wait 2-5 minutes when giving two eye meds    Historical Provider, MD  fluticasone (FLONASE ALLERGY RELIEF) 50 MCG/ACT nasal spray Place 2 sprays into both nostrils daily.    Historical Provider, MD  folic acid (FOLVITE) 1 MG tablet Take 1 mg by mouth daily.    Historical Provider, MD  lanthanum (FOSRENOL) 1000 MG chewable tablet Chew 1,000 mg by mouth 3 (three) times daily with meals.    Historical Provider, MD  loperamide (IMODIUM A-D) 2 MG tablet Take 2 mg by mouth 3 (three) times daily. May take first dose and refuse other two if not needed    Historical Provider, MD  meclizine (ANTIVERT) 12.5 MG tablet Take 12.5 mg by mouth 2 (two) times daily as needed for dizziness.     Historical Provider, MD  Melatonin 5 MG TABS Take 5 mg by mouth at bedtime.     Historical Provider, MD  metoprolol tartrate (LOPRESSOR) 25 MG tablet Take 12.5-25 mg by mouth. Take  25 mg Tues, Thurs, Sat, Sun at [place].  Take 1/2 tablet to = 12.5 mg on Tues, Thurs, Sat, Sun at [facility], MD  midodrine (PROAMATINE) 10 MG tablet Take 10 mg by mouth every Monday, Wednesday, and Friday. On dialysis days    Historical Provider, MD  midodrine (PROAMATINE) 5 MG tablet Take 5 mg by mouth daily. Send 1 tab  with patient to dialysis M-W-F to take mid treatment due to dropping BP    Historical Provider, MD  OXYGEN Inhale 2 L/min into the lungs as needed.     Historical Provider, MD  pantoprazole (PROTONIX) 40 MG tablet Take 40 mg by mouth daily.     Historical Provider, MD  promethazine (PHENERGAN) 25 MG tablet Take 25 mg by mouth. Take 1 tablet daily prn and q8h prn    Historical Provider, MD  traMADol (ULTRAM) 50 MG tablet Take 50 mg by mouth every 8 (eight) hours as needed for severe pain.    Historical Provider, MD  UNABLE TO FIND Med Name: Preparation H CRM Aloe.  Apply to external hemorrhoids BID PRN.    Historical Provider, MD    Family History Family History  Problem Relation Age of Onset  . Cancer Mother   . Diabetes Mother   . Heart disease Mother   . Heart attack Father   . Stroke Father   . Parkinson's disease Brother   . Cancer Sister     Social History Social History  Substance Use Topics  . Smoking status: Never Smoker  . Smokeless tobacco: Never Used  . Alcohol use No     Allergies   Erythromycin; Erythromycin base; Penicillins; Codeine; and Penicillin g   Review of Systems Review of Systems  Constitutional: Negative for chills and fever.  HENT: Positive for congestion. Negative for sore throat.   Eyes: Negative for visual disturbance.  Respiratory: Positive for cough. Negative for shortness of breath.   Cardiovascular: Negative for chest pain and palpitations.  Gastrointestinal: Negative for abdominal pain, diarrhea, nausea and vomiting.  Genitourinary: Negative for dysuria, flank pain, frequency and urgency.  Neurological:  Positive for weakness. Negative for dizziness, syncope, numbness and headaches.  All other systems reviewed and are negative.    Physical Exam Updated Vital Signs BP (!) 96/50   Pulse 67   Temp 97.9 F (36.6 C) (Oral)   Resp 22   Ht _0  (1.6 m)   SpO2 98%   Physical Exam  Constitutional: She is oriented to person, place, and time. She appears well-developed and well-nourished. No distress.  Patient appears frail and elderly. She is not in any acute distress. Patient is sickly appearing.  HENT:  Head: Normocephalic and atraumatic.  Mouth/Throat: Oropharynx is clear and moist.  Eyes: Conjunctivae and EOM are normal. Pupils are equal, round, and reactive to light. Right eye exhibits no discharge. Left eye exhibits no discharge. No scleral icterus.  Neck: Normal range of motion. Neck supple. No thyromegaly present.  Cardiovascular: Normal rate, regular rhythm, normal heart sounds and intact distal pulses.  Exam reveals no gallop and no friction rub.   No murmur heard. Pulmonary/Chest: Effort normal and breath sounds normal. No respiratory distress.  Patient is slightly tachypnea. The patient does have a congested cough. Diminished air sounds bilaterally. No wheezing, rhonchi noted. The patient is not hypoxic.  Abdominal: Soft. Bowel sounds are normal. She exhibits no distension. There is no tenderness. There is no rebound and no guarding.  Musculoskeletal: Normal range of motion.  Lymphadenopathy:    She has no cervical adenopathy.  Neurological: She is alert and oriented to person, place, and time.  Skin: Skin is warm and dry. Capillary refill takes less than 2 seconds. There is pallor.  Diaphoretic  Nursing note and vitals reviewed.    ED Treatments / Results  Labs (all labs ordered are listed, but only abnormal results are displayed)  Labs Reviewed  COMPREHENSIVE METABOLIC PANEL - Abnormal; Notable for the following:       Result Value   Chloride 95 (*)    Glucose, Bld  108 (*)    Creatinine, Ser 1.88 (*)    Calcium 7.5 (*)    Albumin 1.9 (*)    AST 272 (*)    ALT 158 (*)    Alkaline Phosphatase 149 (*)    Total Bilirubin 1.3 (*)    GFR calc non Af Amer 24 (*)    GFR calc Af Amer 28 (*)    All other components within normal limits  CBC WITH DIFFERENTIAL/PLATELET - Abnormal; Notable for the following:    WBC 12.5 (*)    RBC 3.66 (*)    Hemoglobin 11.2 (*)    RDW 17.6 (*)    Neutro Abs 8.1 (*)    All other components within normal limits  I-STAT CG4 LACTIC ACID, ED - Abnormal; Notable for the following:    Lactic Acid, Venous 2.27 (*)    All other components within normal limits  CULTURE, BLOOD (ROUTINE X 2)  CULTURE, BLOOD (ROUTINE X 2)  URINE CULTURE  PROTIME-INR  URINALYSIS, ROUTINE W REFLEX MICROSCOPIC  I-STAT CG4 LACTIC ACID, ED    EKG  EKG Interpretation  Date/Time:  Monday January 19 2017 22:01:44 EST Ventricular Rate:  64 PR Interval:    QRS Duration: 101 QT Interval:  474 QTC Calculation: 490 R Axis:   58 Text Interpretation:  Sinus rhythm Borderline prolonged QT interval No significant change since last tracing Confirmed by Winfred Leeds  MD, SAM [phone]) on 01/19/2017 10:06:37 PM       Radiology Dg Chest Port 1 View  Result Date: 01/19/2017 CLINICAL DATA:  Pneumonia for 3 weeks, not improved on antibiotics. EXAM: PORTABLE CHEST 1 VIEW COMPARISON:  12/18/2017 FINDINGS: Left base consolidation, consistent with pneumonia. Right lung is clear. Probable small left pleural effusion. Pulmonary vasculature is normal. Hilar, mediastinal and cardiac contours are unremarkable. IMPRESSION: Left base consolidation and probable small effusion, consistent with pneumonia. Followup PA and lateral chest X-ray is recommended in 3-4 weeks following trial of antibiotic therapy to ensure resolution and exclude underlying malignancy. Electronically Signed   By: Andreas Newport M.D.   On: 01/19/2017 21:28    Procedures Procedures (including critical  care time)  Medications Ordered in ED Medications  sodium chloride 0.9 % bolus 1,000 mL (not administered)    And  sodium chloride 0.9 % bolus 1,000 mL (not administered)    And  sodium chloride 0.9 % bolus 250 mL (not administered)  aztreonam (AZACTAM) 2 g in dextrose 5 % 50 mL IVPB (not administered)  vancomycin (VANCOCIN) IVPB 1000 mg/200 mL premix (not administered)     Initial Impression / Assessment and Plan / ED Course  I have reviewed the triage vital signs and the nursing notes.  Pertinent labs & imaging results that were available during my care of the patient were reviewed by me and considered in my medical decision making (see chart for details).     The patient resents to the ED with complaint of cough. Patient has been treated for left lower lobe pneumonia with Avelox for the past 7 days. She also been on Doxy 2 weeks ago.Patient completed hemodialysis today. Felt very weak with continued productive cough and was transported to the ED. Initial vital signs showed patient to be slightly tachypnea. Blood pressure was noted to be 85/41. Satting in the mid 27s. Patient with  a mild leukocytosis of 12.5. Lactic acid was initially 1.8. Increase to 2.2. Portable chest shows left base opacity. Patient met sepsis criteria given patient tachypnea, leukocytosis and hypotension. Elevated lactic. Sepsis protocol was initiated. She was given 30 mL per KG fluid bolus. Patient was started on vancomycin, aztrenoam, azithromycin given patient's penicillin allergy.. Influenza swab was ordered. Patient was consult to to try at hospitalist for sepsis admission. This is likely HCAP who failed outpatient therapy. Spoke with Dr. [name] who agrees to admit patient will see patient in ED. Patient was given Tamiflu per his recommendations. Patient was seen and evaluated Dr. Cathleen Fears who agrees with the above plan. Patient is currently hemodynamically stable. Pressures systolic in the low 31P. The patient  and daughter instructed of plan and are agreeable.  CRITICAL CARE Performed by: Ocie Cornfield   Total critical care time: 30 minutes  Critical care time was exclusive of separately billable procedures and treating other patients.  Critical care was necessary to treat or prevent imminent or life-threatening deterioration.  Critical care was time spent personally by me on the following activities: development of treatment plan with patient and/or surrogate as well as nursing, discussions with consultants, evaluation of patient's response to treatment, examination of patient, obtaining history from patient or surrogate, ordering and performing treatments and interventions, ordering and review of laboratory studies, ordering and review of radiographic studies, pulse oximetry and re-evaluation of patient's condition.   Final Clinical Impressions(s) / ED Diagnoses   Final diagnoses:  SOB (shortness of breath)    New Prescriptions New Prescriptions   No medications on file     Doristine Devoid, PA-C 01/21/17 [place], MD 01/22/17 [phone]

## 2017-01-19 NOTE — ED Notes (Signed)
EDP at bedside  

## 2017-01-20 LAB — HEPATIC FUNCTION PANEL
ALT: 134 U/L — AB (ref 14–54)
AST: 222 U/L — AB (ref 15–41)
Albumin: 1.6 g/dL — ABNORMAL LOW (ref 3.5–5.0)
Alkaline Phosphatase: 106 U/L (ref 38–126)
BILIRUBIN DIRECT: 0.3 mg/dL (ref 0.1–0.5)
Indirect Bilirubin: 0.8 mg/dL (ref 0.3–0.9)
Total Bilirubin: 1.1 mg/dL (ref 0.3–1.2)
Total Protein: 5.3 g/dL — ABNORMAL LOW (ref 6.5–8.1)

## 2017-01-20 LAB — BASIC METABOLIC PANEL
Anion gap: 10 (ref 5–15)
BUN: 11 mg/dL (ref 6–20)
CHLORIDE: 101 mmol/L (ref 101–111)
CO2: 26 mmol/L (ref 22–32)
Calcium: 6.7 mg/dL — ABNORMAL LOW (ref 8.9–10.3)
Creatinine, Ser: 2.18 mg/dL — ABNORMAL HIGH (ref 0.44–1.00)
GFR calc Af Amer: 24 mL/min — ABNORMAL LOW (ref >60)
GFR calc non Af Amer: 20 mL/min — ABNORMAL LOW (ref >60)
GLUCOSE: 81 mg/dL (ref 65–99)
POTASSIUM: 3.1 mmol/L — AB (ref 3.5–5.1)
SODIUM: 137 mmol/L (ref 135–145)

## 2017-01-20 LAB — MRSA PCR SCREENING: MRSA by PCR: NEGATIVE

## 2017-01-20 LAB — CBC
HCT: 31.1 % — ABNORMAL LOW (ref 36.0–46.0)
HEMOGLOBIN: 9.4 g/dL — AB (ref 12.0–15.0)
MCH: 30.2 pg (ref 26.0–34.0)
MCHC: 30.2 g/dL (ref 30.0–36.0)
MCV: 100 fL (ref 78.0–100.0)
Platelets: 104 10*3/uL — ABNORMAL LOW (ref 150–400)
RBC: 3.11 MIL/uL — AB (ref 3.87–5.11)
RDW: 17.7 % — ABNORMAL HIGH (ref 11.5–15.5)
WBC: 9.6 10*3/uL (ref 4.0–10.5)

## 2017-01-20 LAB — GLUCOSE, CAPILLARY
GLUCOSE-CAPILLARY: 102 mg/dL — AB (ref 65–99)
Glucose-Capillary: 91 mg/dL (ref 65–99)
Glucose-Capillary: 106 mg/dL — ABNORMAL HIGH (ref 65–99)

## 2017-01-20 LAB — INFLUENZA PANEL BY PCR (TYPE A & B)
INFLAPCR: POSITIVE — AB
Influenza B By PCR: NEGATIVE

## 2017-01-20 LAB — PROCALCITONIN: PROCALCITONIN: 0.7 ng/mL

## 2017-01-20 LAB — CBG MONITORING, ED
GLUCOSE-CAPILLARY: 144 mg/dL — AB (ref 65–99)
Glucose-Capillary: 68 mg/dL (ref 65–99)
Glucose-Capillary: 98 mg/dL (ref 65–99)

## 2017-01-20 LAB — BRAIN NATRIURETIC PEPTIDE: B Natriuretic Peptide: 451.4 pg/mL — ABNORMAL HIGH (ref 0.0–100.0)

## 2017-01-20 MED ORDER — PANTOPRAZOLE SODIUM 40 MG PO TBEC
40.0000 mg | DELAYED_RELEASE_TABLET | Freq: Every day | ORAL | Status: DC
  Administered 2017-01-20 – 2017-02-03 (×15): 40 mg via ORAL
  Filled 2017-01-20 (×15): qty 1

## 2017-01-20 MED ORDER — ONDANSETRON HCL 4 MG PO TABS: 4.0000 mg | ORAL_TABLET | Freq: Four times a day (QID) | ORAL | Status: DC | PRN

## 2017-01-20 MED ORDER — DM-GUAIFENESIN ER 30-600 MG PO TB12
1.0000 | ORAL_TABLET | Freq: Two times a day (BID) | ORAL | Status: DC
Start: 2017-01-20 — End: 2017-01-28
  Administered 2017-01-20 – 2017-01-28 (×17): 1 via ORAL
  Filled 2017-01-20 (×17): qty 1

## 2017-01-20 MED ORDER — ALBUTEROL SULFATE (2.5 MG/3ML) 0.083% IN NEBU
2.5000 mg | INHALATION_SOLUTION | RESPIRATORY_TRACT | Status: DC | PRN
  Administered 2017-01-24 – 2017-01-27 (×5): 2.5 mg via RESPIRATORY_TRACT
  Filled 2017-01-20 (×5): qty 3

## 2017-01-20 MED ORDER — MIDODRINE HCL 5 MG PO TABS
5.0000 mg | ORAL_TABLET | ORAL | Status: DC
  Administered 2017-01-21 – 2017-01-23 (×2): 5 mg via ORAL
  Filled 2017-01-20: qty 1

## 2017-01-20 MED ORDER — ALLOPURINOL 100 MG PO TABS
100.0000 mg | ORAL_TABLET | Freq: Every day | ORAL | Status: DC
  Administered 2017-01-20 – 2017-02-03 (×15): 100 mg via ORAL
  Filled 2017-01-20 (×15): qty 1

## 2017-01-20 MED ORDER — ORAL CARE MOUTH RINSE
15.0000 mL | Freq: Two times a day (BID) | OROMUCOSAL | Status: DC
  Administered 2017-01-20 – 2017-02-03 (×21): 15 mL via OROMUCOSAL

## 2017-01-20 MED ORDER — ATORVASTATIN CALCIUM 80 MG PO TABS
80.0000 mg | ORAL_TABLET | Freq: Every day | ORAL | Status: DC
  Administered 2017-01-20 – 2017-01-23 (×3): 80 mg via ORAL
  Filled 2017-01-20 (×3): qty 1

## 2017-01-20 MED ORDER — INSULIN ASPART 100 UNIT/ML ~~LOC~~ SOLN: 0.0000 [IU] | Freq: Every day | SUBCUTANEOUS | Status: DC

## 2017-01-20 MED ORDER — ASPIRIN EC 325 MG PO TBEC
325.0000 mg | DELAYED_RELEASE_TABLET | Freq: Every day | ORAL | Status: DC
  Administered 2017-01-20 – 2017-02-03 (×15): 325 mg via ORAL
  Filled 2017-01-20 (×15): qty 1

## 2017-01-20 MED ORDER — DIAZEPAM 5 MG PO TABS
5.0000 mg | ORAL_TABLET | Freq: Every evening | ORAL | Status: DC | PRN
  Administered 2017-01-21 – 2017-02-01 (×9): 5 mg via ORAL
  Filled 2017-01-20 (×10): qty 1

## 2017-01-20 MED ORDER — DEXTROSE 5 % IV SOLN
1.0000 g | INTRAVENOUS | Status: DC
  Administered 2017-01-20 – 2017-01-23 (×4): 1 g via INTRAVENOUS
  Filled 2017-01-20 (×4): qty 1

## 2017-01-20 MED ORDER — OSELTAMIVIR PHOSPHATE 30 MG PO CAPS
30.0000 mg | ORAL_CAPSULE | ORAL | Status: AC
  Administered 2017-01-23: 30 mg via ORAL
  Filled 2017-01-20 (×2): qty 1

## 2017-01-20 MED ORDER — LANTHANUM CARBONATE 500 MG PO CHEW
1000.0000 mg | CHEWABLE_TABLET | Freq: Three times a day (TID) | ORAL | Status: DC
  Administered 2017-01-20 – 2017-01-22 (×7): 1000 mg via ORAL
  Filled 2017-01-20 (×8): qty 2

## 2017-01-20 MED ORDER — ACETAMINOPHEN 325 MG PO TABS
650.0000 mg | ORAL_TABLET | Freq: Four times a day (QID) | ORAL | Status: DC | PRN
  Administered 2017-01-24 – 2017-01-29 (×7): 650 mg via ORAL
  Filled 2017-01-20 (×7): qty 2

## 2017-01-20 MED ORDER — ONDANSETRON HCL 4 MG/2ML IJ SOLN
4.0000 mg | Freq: Four times a day (QID) | INTRAMUSCULAR | Status: DC | PRN
  Administered 2017-01-24: 4 mg via INTRAVENOUS
  Filled 2017-01-20: qty 2

## 2017-01-20 MED ORDER — HEPARIN SODIUM (PORCINE) 5000 UNIT/ML IJ SOLN
5000.0000 [IU] | Freq: Three times a day (TID) | INTRAMUSCULAR | Status: DC
  Administered 2017-01-20 – 2017-02-03 (×42): 5000 [IU] via SUBCUTANEOUS
  Filled 2017-01-20 (×38): qty 1

## 2017-01-20 MED ORDER — ACETAMINOPHEN 650 MG RE SUPP: 650.0000 mg | Freq: Four times a day (QID) | RECTAL | Status: DC | PRN

## 2017-01-20 MED ORDER — PRO-STAT SUGAR FREE PO LIQD
30.0000 mL | Freq: Two times a day (BID) | ORAL | Status: DC
  Administered 2017-01-20 – 2017-01-25 (×5): 30 mL via ORAL
  Filled 2017-01-20 (×12): qty 30

## 2017-01-20 MED ORDER — RENA-VITE PO TABS
1.0000 | ORAL_TABLET | Freq: Every day | ORAL | Status: DC
  Administered 2017-01-20 – 2017-02-02 (×14): 1 via ORAL
  Filled 2017-01-20 (×15): qty 1

## 2017-01-20 MED ORDER — AMIODARONE HCL 200 MG PO TABS
200.0000 mg | ORAL_TABLET | Freq: Every day | ORAL | Status: DC
  Administered 2017-01-20 – 2017-02-03 (×15): 200 mg via ORAL
  Filled 2017-01-20 (×15): qty 1

## 2017-01-20 MED ORDER — INSULIN ASPART 100 UNIT/ML ~~LOC~~ SOLN
0.0000 [IU] | Freq: Three times a day (TID) | SUBCUTANEOUS | Status: DC
  Administered 2017-01-24 – 2017-01-27 (×4): 1 [IU] via SUBCUTANEOUS
  Administered 2017-01-27: 2 [IU] via SUBCUTANEOUS
  Administered 2017-01-28 – 2017-01-29 (×2): 1 [IU] via SUBCUTANEOUS
  Administered 2017-01-30 – 2017-02-01 (×4): 2 [IU] via SUBCUTANEOUS
  Administered 2017-02-02 – 2017-02-03 (×2): 1 [IU] via SUBCUTANEOUS
  Administered 2017-02-03: 2 [IU] via SUBCUTANEOUS

## 2017-01-20 NOTE — Consult Note (Signed)
[facility] Renal Consultation Note    Indication for Consultation:  Management of ESRD/hemodialysis; anemia, hypertension/volume and secondary hyperparathyroidism PCP: [name], MD at [facility]  HPI: [name] James is a 81 y.o. female with ESRD , DM, HTN, Afib on MWF dialysis at [facility].  She was last seen at dialysis yesterday and doing so poorly she was sent to the ED for evaluation post dialysis.  She was afebrile, but extremely weak with cough. She has had minimal IDWG and most times leaving below EDW about 0.5 kg. She has been on midodrine for BP support.  Earlier this month she had been on doxycyline then unbeknownst to the dialysis center she also had been started on Avelos for LL opacity about a week ago.  The patient has been extremely weak and anorexic with cough and so weak it is difficult for her to cough up sputum. She reports chronic back pain. Recent monthly labs showed an ^ in WBC to 11.5 and drop in alb 2.2 from the 3s. She "just knew she had the flu because it feels different that a viral illness; she last had it when her daughter was 68." She resides at [facility]. She has not had N an V but does report some diarrhea today.  She was afebrile when she presented to the ED and has been on Avelox most recently.  She was + for Influenza A. CXR shows left base consolidation and prob small effusion. WBC is 12.5 K 3.9 post dialysis alb 1.9 Ca 7.5. BC are pending no growth today    Past Medical History:  Diagnosis Date  . Anemia, chronic disease   . Anxiety   . Arthritis    knees  . Atrial fibrillation ([place])   . Barrett esophagus   . Benign paroxysmal positional vertigo   . Breast cancer ([place]) 1980s   bil mastectomies, no radiation or chemo  . Cataract   . Chest pain, atypical 12/05/2008   R/Lmv- normal perfusion all regions, noe ECG changes   . CHF (congestive heart failure) ([place]) 05/13/2011   echo - CH>85%; stage 1 diastolic dysfunction; elevated  LV filling pressure, MAC  . CKD (chronic kidney disease), stage III   . Claudication ([place]) 10/30/2005   doppler - normal evaluation, no evidence of aneurysm, diameter reduction, dissection, compression or vascular abnormality)  . Complication of anesthesia   . Dyslipidemia   . Dysrhythmia    Atrial Fibrillation  . Edema   . ESRD (end stage renal disease) ([place])   . Family history of adverse reaction to anesthesia    son also has nausea   . Fever blister 12/2016  . Gastritis and gastroduodenitis   . GERD (gastroesophageal reflux disease)   . Gout   . Hemodialysis-associated hypotension   . HLD (hyperlipidemia)   . Hypertension   . IBS (irritable bowel syndrome)   . Insomnia   . Morbid obesity ([place])   . PAF (paroxysmal atrial fibrillation) ([place])   . Peripheral neuropathy ([place])   . PONV (postoperative nausea and vomiting)   . Renal cyst   . Renal insufficiency 10/30/2005   doppler - abn resistance consistent w/ parenchymal disease  . Renovascular hypertension   . Type 2 diabetes mellitus with diabetic neuropathy, with long-term current use of insulin ([place])   . Vertigo    Past Surgical History:  Procedure Laterality Date  . ANGIOPLASTY Left 02/12/2015   Procedure: ANGIOPLASTY;  Surgeon: Angelia Mould, MD;  Location: [facility] CATH  LAB;  Service: Cardiovascular;  Laterality: Left;  AVF  . AV FISTULA PLACEMENT Left 11/30/2014   Procedure: ARTERIOVENOUS (AV) FISTULA CREATION LEFT ARM;  Surgeon: Angelia Mould, MD;  Location: [place];  Service: Vascular;  Laterality: Left;  . AV FISTULA PLACEMENT Left 03/20/2015   Procedure:  Inserton of Left Upper Arm Gortex Graft;  Surgeon: Angelia Mould, MD;  Location: [place];  Service: Vascular;  Laterality: Left;  . AV FISTULA PLACEMENT Left 04/12/2015   Procedure: INSERTION OF LEFT ARM  ARTERIOVENOUS GORE-TEX GRAFT ;  Surgeon: Angelia Mould, MD;  Location: [place];  Service: Vascular;  Laterality: Left;  . BACK SURGERY  ~1980  .  CHOLECYSTECTOMY    . ESOPHAGOGASTRODUODENOSCOPY N/A 10/24/2014   Procedure: ESOPHAGOGASTRODUODENOSCOPY (EGD);  Surgeon: Jerene Bears, MD;  Location: [facility] ENDOSCOPY;  Service: Endoscopy;  Laterality: N/A;  . EXCHANGE OF A DIALYSIS CATHETER Left 11/30/2014   Procedure: EXCHANGE OF A DIALYSIS CATHETER, LEFT INTERNAL JUGULAR;  Surgeon: Angelia Mould, MD;  Location: [place];  Service: Vascular;  Laterality: Left;  . EYE SURGERY    . FISTULOGRAM N/A 02/12/2015   Procedure: FISTULOGRAM;  Surgeon: Angelia Mould, MD;  Location[facility] CATH LAB;  Service: Cardiovascular;  Laterality: N/A;  . HEMORRHOID SURGERY    . LIGATION OF ARTERIOVENOUS  FISTULA Left 03/20/2015   Procedure: LIGATION OF ARTERIOVENOUS  FISTULA;  Surgeon: Angelia Mould, MD;  Location: [place];  Service: Vascular;  Laterality: Left;  Marland Kitchen MASTECTOMY Bilateral ~1982   bil breast reconstruction with implants  . ORIF TIBIA & FIBULA FRACTURES Left 2007   also had left non displaced malleolar fracture.   Marland Kitchen REMOVAL OF GRAFT Left 04/12/2015   Procedure: REMOVAL OF LEFT ARM ARTERIOVENOUS GORE-TEX GRAFT;  Surgeon: Angelia Mould, MD;  Location: [facility] OR;  Service: Vascular;  Laterality: Left;   Family History  Problem Relation Age of Onset  . Cancer Mother   . Diabetes Mother   . Heart disease Mother   . Heart attack Father   . Stroke Father   . Parkinson's disease Brother   . Cancer Sister    Social History:  reports that she has never smoked. She has never used smokeless tobacco. She reports that she does not drink alcohol or use drugs. Allergies  Allergen Reactions  . Erythromycin Swelling  . Erythromycin Base     Other reaction(s): GI Upset (intolerance)  . Penicillins Other (See Comments)    Unknown allergic reaction.  She has taken keflex and Rocephin many times without problems  . Codeine Rash and Palpitations    Other reaction(s): GI Upset (intolerance)  . Penicillin G Rash   Prior to Admission medications    Medication Sig Start Date End Date Taking? Authorizing Provider  albuterol (PROVENTIL) (2.5 MG/3ML) 0.083% nebulizer solution Take 2.5 mg by nebulization. 6AM, 2PM, 10PM x1 week   Yes Historical Provider, MD  allopurinol (ZYLOPRIM) 100 MG tablet Take 100 mg by mouth daily.    Yes Historical Provider, MD  amiodarone (PACERONE) 200 MG tablet Take 200 mg by mouth daily.  06/08/15  Yes Mahima Bubba Camp, MD  atorvastatin (LIPITOR) 80 MG tablet Take 1 tablet (80 mg total) by mouth daily at 6 PM. 12/22/14  Yes Geradine Girt, DO  B Complex-C-Folic Acid (NEPHRO-VITE PO) Take 1 tablet by mouth at bedtime.    Yes Historical Provider, MD  cetirizine (ZYRTEC) 10 MG tablet Take 5 mg by mouth at bedtime.    Yes  Historical Provider, MD  dextromethorphan-guaiFENesin (MUCINEX DM) 30-600 MG 12hr tablet Take 1 tablet by mouth 2 (two) times daily. X 1 week 01/14/17 01/21/17 Yes Historical Provider, MD  diazepam (VALIUM) 5 MG tablet Take one tablet by mouth every night at bedtime for rest 09/09/16  Yes Lauree Chandler, NP  dicyclomine (BENTYL) 20 MG tablet Take 1 tablet (20 mg total) by mouth 3 (three) times daily. 06/05/15  Yes Jerene Bears, MD  dorzolamide-timolol (COSOPT) 22.3-6.8 MG/ML ophthalmic solution Place 1 drop into the left eye 2 (two) times daily. Wait 2-5 minutes when giving two eye meds   Yes Historical Provider, MD  fluticasone (FLONASE ALLERGY RELIEF) 50 MCG/ACT nasal spray Place 2 sprays into both nostrils daily.   Yes Historical Provider, MD  folic acid (FOLVITE) 1 MG tablet Take 1 mg by mouth daily.   Yes Historical Provider, MD  lanthanum (FOSRENOL) 1000 MG chewable tablet Chew 1,000 mg by mouth 3 (three) times daily with meals.   Yes Historical Provider, MD  meclizine (ANTIVERT) 12.5 MG tablet Take 12.5 mg by mouth 2 (two) times daily as needed for dizziness.    Yes Historical Provider, MD  Melatonin 5 MG TABS Take 5 mg by mouth at bedtime.    Yes Historical Provider, MD  metoprolol tartrate (LOPRESSOR)  25 MG tablet Take 12.5-25 mg by mouth. Take 25 mg Tues, Thurs, Sat, Sun at [place].  Take 1/2 tablet to = 12.5 mg on Tues, Thurs, Sat, Sun at [facility] for SBP <100 HR less then 60   Yes Historical Provider, MD  midodrine (PROAMATINE) 10 MG tablet Take 10 mg by mouth every Monday, Wednesday, and Friday. On dialysis days   Yes Historical Provider, MD  midodrine (PROAMATINE) 5 MG tablet Take 5 mg by mouth daily. Send 1 tab with patient to dialysis M-W-F to take mid treatment due to dropping BP   Yes Historical Provider, MD  moxifloxacin (AVELOX) 400 MG tablet Take 400 mg by mouth daily at 8 pm. 01/14/17 01/20/17 Yes Historical Provider, MD  OXYGEN Inhale 2 L/min into the lungs as needed.    Yes Historical Provider, MD  saccharomyces boulardii (FLORASTOR) 250 MG capsule Take 250 mg by mouth 2 (two) times daily. 01/14/17 01/23/17 Yes Historical Provider, MD  sulfacetamide (BLEPH-10) 10 % ophthalmic ointment Place 0.5 inches into the left eye 4 (four) times daily. 01/14/17 01/21/17 Yes Historical Provider, MD  [place] Name: Preparation H CRM Aloe.  Apply to external hemorrhoids BID PRN.   Yes Historical Provider, MD  promethazine (PHENERGAN) 25 MG tablet Take 25 mg by mouth. Take 1 tablet daily prn and q8h prn    Historical Provider, MD   Current Facility-Administered Medications  Medication Dose Route Frequency Provider Last Rate Last Dose  . acetaminophen (TYLENOL) tablet 650 mg  650 mg Oral Q6H PRN Edwin Dada, MD       Or  . acetaminophen (TYLENOL) suppository 650 mg  650 mg Rectal Q6H PRN Edwin Dada, MD      . albuterol (PROVENTIL) (2.5 MG/3ML) 0.083% nebulizer solution 2.5 mg  2.5 mg Nebulization Q2H PRN Edwin Dada, MD      . allopurinol (ZYLOPRIM) tablet 100 mg  100 mg Oral Daily Edwin Dada, MD   100 mg at 01/20/17 1021  . amiodarone (PACERONE) tablet 200 mg  200 mg Oral Daily Edwin Dada, MD   200 mg at 01/20/17 1020  . aspirin EC tablet 325  mg  325 mg Oral Daily Edwin Dada, MD   325 mg at 01/20/17 1021  . atorvastatin (LIPITOR) tablet 80 mg  80 mg Oral q1800 Edwin Dada, MD      . ceFEPIme (MAXIPIME) 1 g in dextrose 5 % 50 mL IVPB  1 g Intravenous Q24H Edwin Dada, MD   Stopped at 01/20/17 0251  . dextromethorphan-guaiFENesin (MUCINEX DM) 30-600 MG per 12 hr tablet 1 tablet  1 tablet Oral BID Edwin Dada, MD   1 tablet at 01/20/17 1020  . diazepam (VALIUM) tablet 5 mg  5 mg Oral QHS PRN Edwin Dada, MD      . heparin injection 5,000 Units  5,000 Units Subcutaneous Q8H Edwin Dada, MD   Stopped at 01/20/17 [phone]  . insulin aspart (novoLOG) injection 0-5 Units  0-5 Units Subcutaneous QHS Edwin Dada, MD      . insulin aspart (novoLOG) injection 0-9 Units  0-9 Units Subcutaneous TID WC Edwin Dada, MD      . lanthanum (FOSRENOL) chewable tablet 1,000 mg  1,000 mg Oral TID WC Edwin Dada, MD   1,000 mg at 01/20/17 1019  . [START ON 01/21/2017] midodrine (PROAMATINE) tablet 5 mg  5 mg Oral Q M,W,F-HD Edwin Dada, MD      . ondansetron (ZOFRAN) tablet 4 mg  4 mg Oral Q6H PRN Edwin Dada, MD       Or  . ondansetron (ZOFRAN) injection 4 mg  4 mg Intravenous Q6H PRN Edwin Dada, MD      . [place] Memo ON 01/21/2017] oseltamivir (TAMIFLU) capsule 30 mg  30 mg Oral Q M,W,F Edwin Dada, MD      . pantoprazole (PROTONIX) EC tablet 40 mg  40 mg Oral Daily Edwin Dada, MD   40 mg at 01/20/17 1020  . [START ON 01/21/2017] vancomycin (VANCOCIN) IVPB 750 mg/150 ml premix  750 mg Intravenous Q M,W,F-HD Franky Macho, [facility]       Labs: Basic Metabolic Panel:  Recent Labs Lab 01/19/17 1725 01/20/17 0634  NA 136 137  K 3.9 3.1*  CL 95* 101  CO2 30 26  GLUCOSE 108* 81  BUN 7 11  CREATININE 1.88* 2.18*  CALCIUM 7.5* 6.7*   Liver Function Tests:  Recent Labs Lab 01/19/17 1725 01/20/17 0634  AST 272* 222*  ALT  158* 134*  ALKPHOS 149* 106  BILITOT 1.3* 1.1  PROT 6.7 5.3*  ALBUMIN 1.9* 1.6*   No results for input(s): LIPASE, AMYLASE in the last 168 hours. No results for input(s): AMMONIA in the last 168 hours. CBC:  Recent Labs Lab 01/19/17 1725 01/20/17 0634  WBC 12.5* 9.6  NEUTROABS 8.1*  --   HGB 11.2* 9.4*  HCT 36.5 31.1*  MCV 99.7 100.0  PLT 166 104*   CBG:  Recent Labs Lab 01/20/17 0153 01/20/17 0735 01/20/17 0827 01/20/17 1220  GLUCAP 144* 68 98 102*   Studies/Results: Dg Chest Port 1 View  Result Date: 01/19/2017 CLINICAL DATA:  Pneumonia for 3 weeks, not improved on antibiotics. EXAM: PORTABLE CHEST 1 VIEW COMPARISON:  12/18/2017 FINDINGS: Left base consolidation, consistent with pneumonia. Right lung is clear. Probable small left pleural effusion. Pulmonary vasculature is normal. Hilar, mediastinal and cardiac contours are unremarkable. IMPRESSION: Left base consolidation and probable small effusion, consistent with pneumonia. Followup PA and lateral chest X-ray is recommended in 3-4 weeks following trial of antibiotic therapy to ensure resolution and exclude  underlying malignancy. Electronically Signed   By: Andreas Newport M.D.   On: 01/19/2017 21:28    ROS: As per HPI otherwise negative.  Physical Exam: Vitals:   01/20/17 1015 01/20/17 1045 01/20/17 1115 01/20/17 1200  BP: (!) 115/45 109/76 122/99 102/75  Pulse: 65 66 70 64  Resp: 22 21 25 17   Temp:    98 F (36.7 C)  TempSrc:    Oral  SpO2: 98% 99% 98% 99%  Weight:    73.4 kg (161 lb 13.1 oz)  Height:    5' 3"  (1.6 m)     General: ill appearing frail elderly WF Head: NCAT sclera not icteric MMM Neck: Supple.  Lungs: coarse BS anteriorly - weak effort, cough is weak Breathing is unlabored. Heart: RRR with S1 S2.  Abdomen: soft NT + BS Lower extremities: no sig without edema  Neuro: A & O  X 3. Moves all extremities spontaneously. Psych:  Responds to questions appropriately with a normal  affect. Dialysis Access: left AVGG + bruit-- dressing removed - still having pin point oozing- asked RN to re bandage   Dialysis Orders: MWF AF 4 hr 72.5 - leaving ~72 very low gains 2 K 2.25 Ca  Heparin 3300 left AVGG no calcitriol or Fe, ESA Recent labs: hgb 11 22% sat ferrtin 2610 iPTH 180 alb 2.2 P 3 Access Hx - PTA 80 - 90% outflo ax vein stenosis and 70% venous stenosis 12/19/17- post procedure AF 600s which dropped to 300s twice in January (this was her pre procedure AF range- kinetics however great)  Assessment/Plan: 1. Sepsis/PNAInfluenza A - on tamiflu - other abtx per primary; lying near flat on back - discussed with RN - who has tried to get her sitting more vertically but pt insists it is more comfortable to be reclining more - difficult to cough adequately in this position 2. ESRD -  MWF - HD tomorrow; noted decline in AF shortly after intervention last month - will d/w Dr. Augustin Coupe 3. Hypertension/volume  - chronically low on midodrine - needs EDW lowered - not eating 4. Anemia  - hgb stable - hold on ESA for now 5. Metabolic bone disease -  No indication for VDRA, eating poorly - on fosrenol. 6. Severe protein malnutrition - liberalize diet to regular due to poor intake (she is on this at [facility]) supplements cause diarrhea by report - add prosta/vitamins; encouraged her to at least sip liquids 7. PAF - on BB 8. Increased LFT - follow - ? Acute illness; lower volume may have a degree on liver congestion; on statin, but not likely etiology    [name] Jacobson, PA-C [place] [phone] 01/20/2017, 1:14 PM   Patient seen and examined.  Agree with PA A/P Patient very weak with a positive influenza A test. She did receive a full HD treatment yesterday and no acute indication today. Plan on HD tomorrow.  Otelia Santee, MD [facility]

## 2017-01-20 NOTE — ED Notes (Signed)
Pt did not need anything at this time  

## 2017-01-20 NOTE — ED Notes (Signed)
PT CBG was 70 nurse notified

## 2017-01-20 NOTE — Clinical Social Work Note (Signed)
Clinical Social Work Assessment  Patient Details  Name: [name] James MRN: [id number] Date of Birth: 02-24-1936  Date of referral:  01/20/17               Reason for consult:  Facility Placement                Permission sought to share information with:  Family Supports Permission granted to share information::  Yes, Verbal Permission Granted  Name::     Coralyn Mark (daughter) [phone]; [phone]  Agency::     Relationship::     Contact Information:     Housing/Transportation Living arrangements for the past 2 months:  [place] of Information:  Patient Patient Interpreter Needed:  None Criminal Activity/Legal Involvement Pertinent to Current Situation/Hospitalization:  No - Comment as needed Significant Relationships:  Adult Children, Other Family Members Lives with:  Facility Resident Do you feel safe going back to the place where you live?  Yes Need for family participation in patient care:  Yes (Comment)  Care giving concerns:  Patient is a long term resident of [facility]. No care giving concerns identified at this time.   Social Worker assessment / plan: Patient is a 81 YO female admitted due to sepsis from HCAP and influenza. Patient is a long term resident of [facility]. CSW engaged with Patient at her bedside. CSW introduced self, role of CSW, and began discussion of discharge planning. Patient reports being familiar with SNF placement from previous hospitalizations. Patient reports that she has been living at [facility] for "going on 3 years" and would like to return at discharge. CSW to facilitate SNF placement back to [facility] once medically appropriate.   Employment status:  Retired Forensic scientist:  Information systems manager, Medicaid [place] PT Recommendations:  Not assessed at this time [place] / Referral to community resources:  [place]  Patient/Family's Response to care:  Patient very appreciative of care received thus far in hospitalization course. Patient agreeable to return to [facility] at discharge.   Patient/Family's Understanding of and Emotional Response to Diagnosis, Current Treatment, and Prognosis:  Patient able to verbalize understanding of diagnosis, treatment, and prognosis. Patient wanting to return to [facility] at discharge.   Emotional Assessment Appearance:  Appears stated age Attitude/Demeanor/Rapport:   (Cooperative; Engaging; Appropriate) Affect (typically observed):  Appropriate, Stable, Pleasant, Calm Orientation:  Oriented to Self, Oriented to Place, Oriented to  Time, Oriented to Situation Alcohol / Substance use:  Not Applicable Psych involvement (Current and /or in the community):  No (Comment)  Discharge Needs  Concerns to be addressed:  Care Coordination, Discharge Planning Concerns Readmission within the last 30 days:  No Current discharge risk:  None Barriers to Discharge:  Continued Medical Work up   [employer], LCSW 01/20/2017, 10:48 AM

## 2017-01-20 NOTE — Consult Note (Signed)
[facility] Nurse wound consult note Reason for Consult: pressure injury Wound type: Stage 2 Pressure injury sacrum with non blanchable redness over the buttocks.  Patient is incontinent of stool, so the redness also can be related to MASD  Pressure Injury POA: Yes Measurement: small open area centrally aprox. 0.5cm x 0.5cm x 0.1cm  Wound JWB:DGREU, pink Drainage (amount, consistency, odor) none Periwound: intact  Dressing procedure/placement/frequency: Patient has been holding in ED for IICU bed, will place patient on LALM while in ED for moisture management and pressure redistribution.  Foam dressing to protect the area from further trauma and for pressure redistribution.  Discussed POC with patient and bedside nurse.  Re consult if needed, will not follow at this time. Thanks  Burna Atlas [employer], RN,CWOCN, CNS [phone])

## 2017-01-20 NOTE — ED Notes (Signed)
Pt is eating breakfast  

## 2017-01-20 NOTE — Care Management Note (Signed)
Case Management Note  Patient Details  Name: [name] James MRN: [id number] Date of Birth: 1936/05/24  Subjective/Objective:                  From [place]. /81 y.o. female with a past medical history significant for ESRD on HD MWF, IDDM, HTN, Afib not on warfarin because of bleeding risk who presents with cough.   Action/Plan: Admit status INPATIENT (Sepsis from pneumonia); anticipate discharge RETURN TO SNF.   Expected Discharge Date:   (unsured)               Expected Discharge Plan:  [place]  In-House Referral:  Clinical Social Work  Discharge planning Services  CM Consult  Post Acute Care Choice:    Choice offered to:     DME Arranged:    DME Agency:     HH Arranged:    [place] Agency:     Status of Service:  In process, will continue to follow  If discussed at Long Length of Stay Meetings, dates discussed:    Additional Comments:  Fuller Mandril, RN 01/20/2017, 9:43 AM

## 2017-01-20 NOTE — ED Notes (Signed)
Pt linens changed completely. Sacral dressing placed per woc recommendations.

## 2017-01-20 NOTE — Progress Notes (Signed)
Patient from [facility]. CSW now following for disposition and return to SNF once medically appropriate.    Lorrine Kin, MSW, LCSW [facility] ED/63M Clinical Social Worker [phone]

## 2017-01-20 NOTE — Progress Notes (Signed)
PROGRESS NOTE  [name] James ZOX:[id number] DOB: [birth year] DOA: 01/19/2017 PCP: [name], MD  HPI/Recap of past 24 hours:  On 2liter oxygen, no fever, blood pressure has improved She denies chest pain, still has congested cough  Assessment/Plan: Principal Problem:   Sepsis ([place]) Active Problems:   Essential hypertension   Anemia   ESRD (end stage renal disease) (HCC)   Paroxysmal atrial fibrillation (HCC)   Controlled type 2 diabetes mellitus with diabetic polyneuropathy, with long-term current use of insulin (HCC)   HCAP (healthcare-associated pneumonia)   Pressure ulcer  1. Sepsis from pneumonia:  Source of infection left lower lobe pna and influenza A. Organism unknown.  Sputum Culture and urine pna antigen test need to be collected, I have informed RN at bedside.  Patient meets criteria given tachypnea, leukocytosis, and evidence of organ dysfunction.  Lactate 2.2 mmol/L and repeat ordered within 6 hours.  This patient is at high risk of poor outcomes with a qSOFA score of 2.  Antibiotics delivered in the ED.  she is admitted to stepdown,  -Sepsis bundle utilized:             -Blood and urine cultures drawn             -30 ml/kg bolus given in ED, will repeat lactic acid             -Antibiotics: vancomycin and cefepime             -Will defer atypical coverage because recent doxycycline/appropriate treatment             -Oseltamivir renal dosing             -sputum and Strep pneumo and legionella antigens ordered, pending collection             -Trend procalcitonin -swallow eval, aspiration precaution               2. Hypertension: presented with hypotension/sepsis On metoprolol daily, midodrine for intradialytic hypotension -patient presented with hypotension, sbp in 80's initial on presentation, Hold metoprolol until hemodynamics clearer  3. ESRD on HD MWF:  -Continue Lanthanum -Continue midodrine with dialysis -nephrology consulted  4. Pressure  ulcer, presented on admission -Stage 2 Pressure injury sacrum with non blanchable redness over the buttocks.  presented on admission.  Patient is incontinent of stool, so the redness also can be related to MASD  Wound care input appreciated  5. Diet controlled Diabetes:  Not on antidiabetics.  6. pAF:  CHADS2Vasc 7.  Not on warfarin because of risk of bleeding. -Continue amiodarone -Continue full aspirin  7. Other medications:  -Continue allopurinol and PPI  8. Anemia: Stable at baseline  9. Chronic diastolic CHF: Currently, seems euvolemic. -Strict I/Os -no edema, volume managed by dialysis  10. Elevated LFTs: No abdominal pain or nausea.  Presumably from sepsis/influenza. -Trend LFTs , check ck, hepatitis panel, abdominal US.   FTT: baseline bed to wheelchair bound, need lift for transfer.  DVT prophylaxis: Lovenox    Code Status: full  Family Communication: patient   Disposition Plan: likely able to transfer to med tele if blood pressure continue to be stable   Consultants:  nephrology  Procedures:  Dialysis MWF  Antibiotics:  Vanc/cefepime    Objective: BP (!) 123/40   Pulse 61   Temp 98 F (36.7 C) (Oral)   Resp 20   Ht 5\' 3"  (1.6 m)   Wt 73.4 kg (161 lb 13.1 oz)   SpO2 100%  BMI 28.66 kg/m   Intake/Output Summary (Last 24 hours) at 01/20/17 1625 Last data filed at 01/20/17 1400  Gross per 24 hour  Intake             2420 ml  Output                0 ml  Net             2420 ml   Filed Weights   01/20/17 1200  Weight: 73.4 kg (161 lb 13.1 oz)    Exam:   General:  Frail, with congested cough, but able to talk in full sentences, NAD  Cardiovascular: RRR  Respiratory: +rhonchi, crackles left lower lobe  Abdomen: Soft/ND/NT, positive BS  Musculoskeletal: No Edema  Neuro: aaox3  Data Reviewed: Basic Metabolic Panel:  Recent Labs Lab 01/19/17 1725 01/20/17 0634  NA 136 137  K 3.9 3.1*  CL 95* 101  CO2 30 26   GLUCOSE 108* 81  BUN 7 11  CREATININE 1.88* 2.18*  CALCIUM 7.5* 6.7*   Liver Function Tests:  Recent Labs Lab 01/19/17 1725 01/20/17 0634  AST 272* 222*  ALT 158* 134*  ALKPHOS 149* 106  BILITOT 1.3* 1.1  PROT 6.7 5.3*  ALBUMIN 1.9* 1.6*   No results for input(s): LIPASE, AMYLASE in the last 168 hours. No results for input(s): AMMONIA in the last 168 hours. CBC:  Recent Labs Lab 01/19/17 1725 01/20/17 0634  WBC 12.5* 9.6  NEUTROABS 8.1*  --   HGB 11.2* 9.4*  HCT 36.5 31.1*  MCV 99.7 100.0  PLT 166 104*   Cardiac Enzymes:   No results for input(s): CKTOTAL, CKMB, CKMBINDEX, TROPONINI in the last 168 hours. BNP (last 3 results)  Recent Labs  01/20/17 0634  BNP 451.4*    ProBNP (last 3 results) No results for input(s): PROBNP in the last 8760 hours.  CBG:  Recent Labs Lab 01/20/17 0153 01/20/17 0735 01/20/17 0827 01/20/17 1220  GLUCAP 144* 68 98 102*    Recent Results (from the past 240 hour(s))  Culture, blood (Routine x 2)     Status: None (Preliminary result)   Collection Time: 01/19/17  5:22 PM  Result Value Ref Range Status   Specimen Description BLOOD RIGHT ANTECUBITAL  Final   Special Requests BOTTLES DRAWN AEROBIC AND ANAEROBIC 5CC  Final   Culture NO GROWTH < 24 HOURS  Final   Report Status PENDING  Incomplete  MRSA PCR Screening     Status: None   Collection Time: 01/20/17 12:10 PM  Result Value Ref Range Status   MRSA by PCR NEGATIVE NEGATIVE Final    Comment:        The GeneXpert MRSA Assay (FDA approved for NASAL specimens only), is one component of a comprehensive MRSA colonization surveillance program. It is not intended to diagnose MRSA infection nor to guide or monitor treatment for MRSA infections.      Studies: Dg Chest Port 1 View  Result Date: 01/19/2017 CLINICAL DATA:  Pneumonia for 3 weeks, not improved on antibiotics. EXAM: PORTABLE CHEST 1 VIEW COMPARISON:  12/18/2017 FINDINGS: Left base consolidation,  consistent with pneumonia. Right lung is clear. Probable small left pleural effusion. Pulmonary vasculature is normal. Hilar, mediastinal and cardiac contours are unremarkable. IMPRESSION: Left base consolidation and probable small effusion, consistent with pneumonia. Followup PA and lateral chest X-ray is recommended in 3-4 weeks following trial of antibiotic therapy to ensure resolution and exclude underlying malignancy. Electronically Signed   By:  Andreas Newport M.D.   On: 01/19/2017 21:28    Scheduled Meds: . allopurinol  100 mg Oral Daily  . amiodarone  200 mg Oral Daily  . aspirin  325 mg Oral Daily  . atorvastatin  80 mg Oral q1800  . ceFEPime (MAXIPIME) IV  1 g Intravenous Q24H  . dextromethorphan-guaiFENesin  1 tablet Oral BID  . feeding supplement (PRO-STAT SUGAR FREE 64)  30 mL Oral BID  . heparin  5,000 Units Subcutaneous Q8H  . insulin aspart  0-5 Units Subcutaneous QHS  . insulin aspart  0-9 Units Subcutaneous TID WC  . lanthanum  1,000 mg Oral TID WC  . mouth rinse  15 mL Mouth Rinse BID  . [START ON 01/21/2017] midodrine  5 mg Oral Q M,W,F-HD  . multivitamin  1 tablet Oral QHS  . [START ON 01/21/2017] oseltamivir  30 mg Oral Q M,W,F  . pantoprazole  40 mg Oral Daily  . [START ON 01/21/2017] vancomycin  750 mg Intravenous Q M,W,F-HD    Continuous Infusions:   Time spent: 17mins  Lowery Paullin MD, PhD  [facility] Pager [phone]. If 7PM-7AM, please contact night-coverage at [email or web address], password [facility] 01/20/2017, 4:25 PM  LOS: 1 day

## 2017-01-20 NOTE — ED Notes (Signed)
Pts daughter updated on inpt room assignment

## 2017-01-20 NOTE — ED Notes (Signed)
Attempted report x1. 

## 2017-01-20 NOTE — ED Notes (Signed)
PT did not have to use RR at this time  

## 2017-01-21 ENCOUNTER — Inpatient Hospital Stay (HOSPITAL_COMMUNITY): Payer: Medicare Other

## 2017-01-21 DIAGNOSIS — A419 Sepsis, unspecified organism: Principal | ICD-10-CM

## 2017-01-21 DIAGNOSIS — Z992 Dependence on renal dialysis: Secondary | ICD-10-CM | POA: Diagnosis not present

## 2017-01-21 DIAGNOSIS — E1142 Type 2 diabetes mellitus with diabetic polyneuropathy: Secondary | ICD-10-CM

## 2017-01-21 DIAGNOSIS — E1122 Type 2 diabetes mellitus with diabetic chronic kidney disease: Secondary | ICD-10-CM | POA: Diagnosis not present

## 2017-01-21 DIAGNOSIS — Z794 Long term (current) use of insulin: Secondary | ICD-10-CM

## 2017-01-21 DIAGNOSIS — N186 End stage renal disease: Secondary | ICD-10-CM | POA: Diagnosis not present

## 2017-01-21 LAB — HEPATIC FUNCTION PANEL
ALBUMIN: 1.4 g/dL — AB (ref 3.5–5.0)
ALT: 118 U/L — AB (ref 14–54)
AST: 196 U/L — ABNORMAL HIGH (ref 15–41)
Alkaline Phosphatase: 108 U/L (ref 38–126)
BILIRUBIN INDIRECT: 0.7 mg/dL (ref 0.3–0.9)
Bilirubin, Direct: 0.3 mg/dL (ref 0.1–0.5)
TOTAL PROTEIN: 5.1 g/dL — AB (ref 6.5–8.1)
Total Bilirubin: 1 mg/dL (ref 0.3–1.2)

## 2017-01-21 LAB — LACTIC ACID, PLASMA: LACTIC ACID, VENOUS: 0.9 mmol/L (ref 0.5–1.9)

## 2017-01-21 LAB — BASIC METABOLIC PANEL
ANION GAP: 9 (ref 5–15)
BUN: 17 mg/dL (ref 6–20)
CALCIUM: 6.9 mg/dL — AB (ref 8.9–10.3)
CO2: 25 mmol/L (ref 22–32)
Chloride: 101 mmol/L (ref 101–111)
Creatinine, Ser: 2.85 mg/dL — ABNORMAL HIGH (ref 0.44–1.00)
GFR calc non Af Amer: 15 mL/min — ABNORMAL LOW (ref >60)
GFR, EST AFRICAN AMERICAN: 17 mL/min — AB (ref >60)
GLUCOSE: 91 mg/dL (ref 65–99)
POTASSIUM: 3.1 mmol/L — AB (ref 3.5–5.1)
Sodium: 135 mmol/L (ref 135–145)

## 2017-01-21 LAB — CK: Total CK: 982 U/L — ABNORMAL HIGH (ref 38–234)

## 2017-01-21 LAB — PROCALCITONIN: Procalcitonin: 0.78 ng/mL

## 2017-01-21 LAB — HEMOGLOBIN A1C
HEMOGLOBIN A1C: 5.7 % — AB (ref 4.8–5.6)
MEAN PLASMA GLUCOSE: 117 mg/dL

## 2017-01-21 LAB — STREP PNEUMONIAE URINARY ANTIGEN: Strep Pneumo Urinary Antigen: POSITIVE — AB

## 2017-01-21 LAB — GLUCOSE, CAPILLARY
GLUCOSE-CAPILLARY: 84 mg/dL (ref 65–99)
Glucose-Capillary: 110 mg/dL — ABNORMAL HIGH (ref 65–99)
Glucose-Capillary: 81 mg/dL (ref 65–99)

## 2017-01-21 LAB — EXPECTORATED SPUTUM ASSESSMENT W GRAM STAIN, RFLX TO RESP C

## 2017-01-21 MED ORDER — LIDOCAINE-PRILOCAINE 2.5-2.5 % EX CREA: 1.0000 "application " | TOPICAL_CREAM | CUTANEOUS | Status: DC | PRN

## 2017-01-21 MED ORDER — PENTAFLUOROPROP-TETRAFLUOROETH EX AERO: 1.0000 "application " | INHALATION_SPRAY | CUTANEOUS | Status: DC | PRN

## 2017-01-21 MED ORDER — GUAIFENESIN-DM 100-10 MG/5ML PO SYRP
10.0000 mL | ORAL_SOLUTION | ORAL | Status: DC | PRN
  Administered 2017-01-21 – 2017-01-30 (×11): 10 mL via ORAL
  Filled 2017-01-21 (×11): qty 10

## 2017-01-21 MED ORDER — SODIUM CHLORIDE 0.9 % IV SOLN: 100.0000 mL | INTRAVENOUS | Status: DC | PRN

## 2017-01-21 MED ORDER — SODIUM CHLORIDE 0.9 % IV SOLN
30.0000 meq | Freq: Once | INTRAVENOUS | Status: AC
  Administered 2017-01-21: 30 meq via INTRAVENOUS
  Filled 2017-01-21: qty 15

## 2017-01-21 MED ORDER — SODIUM CHLORIDE 0.9 % IV BOLUS (SEPSIS)
250.0000 mL | Freq: Once | INTRAVENOUS | Status: AC
  Administered 2017-01-21: 250 mL via INTRAVENOUS

## 2017-01-21 MED ORDER — HEPARIN SODIUM (PORCINE) 1000 UNIT/ML DIALYSIS: 1000.0000 [IU] | INTRAMUSCULAR | Status: DC | PRN

## 2017-01-21 MED ORDER — HEPARIN SODIUM (PORCINE) 1000 UNIT/ML DIALYSIS: 20.0000 [IU]/kg | INTRAMUSCULAR | Status: DC | PRN

## 2017-01-21 MED ORDER — LIDOCAINE HCL (PF) 1 % IJ SOLN: 5.0000 mL | INTRAMUSCULAR | Status: DC | PRN

## 2017-01-21 MED ORDER — VANCOMYCIN HCL IN DEXTROSE 750-5 MG/150ML-% IV SOLN
INTRAVENOUS | Status: AC
  Administered 2017-01-21: 750 mg via INTRAVENOUS
  Filled 2017-01-21: qty 150

## 2017-01-21 MED ORDER — POTASSIUM CHLORIDE CRYS ER 20 MEQ PO TBCR
40.0000 meq | EXTENDED_RELEASE_TABLET | Freq: Once | ORAL | Status: DC
Start: 2017-01-21 — End: 2017-01-23
  Filled 2017-01-21: qty 2

## 2017-01-21 MED ORDER — ALTEPLASE 2 MG IJ SOLR: 2.0000 mg | Freq: Once | INTRAMUSCULAR | Status: DC | PRN

## 2017-01-21 NOTE — Evaluation (Signed)
Clinical/Bedside Swallow Evaluation Patient Details  Name: [name] James MRN: [id number] Date of Birth: 08/25/1936  Today's Date: 01/21/2017 Time: SLP Start Time (ACUTE ONLY): 1010 SLP Stop Time (ACUTE ONLY): 1033 SLP Time Calculation (min) (ACUTE ONLY): 23 min  Past Medical History:  Past Medical History:  Diagnosis Date  . Anemia, chronic disease   . Anxiety   . Arthritis    knees  . Atrial fibrillation ([place])   . Barrett esophagus   . Benign paroxysmal positional vertigo   . Breast cancer ([place]) 1980s   bil mastectomies, no radiation or chemo  . Cataract   . Chest pain, atypical 12/05/2008   R/Lmv- normal perfusion all regions, noe ECG changes   . CHF (congestive heart failure) ([place]) 05/13/2011   echo - JH>41%; stage 1 diastolic dysfunction; elevated LV filling pressure, MAC  . CKD (chronic kidney disease), stage III   . Claudication ([place]) 10/30/2005   doppler - normal evaluation, no evidence of aneurysm, diameter reduction, dissection, compression or vascular abnormality)  . Complication of anesthesia   . Dyslipidemia   . Dysrhythmia    Atrial Fibrillation  . Edema   . ESRD (end stage renal disease) ([place])   . Family history of adverse reaction to anesthesia    son also has nausea   . Fever blister 12/2016  . Gastritis and gastroduodenitis   . GERD (gastroesophageal reflux disease)   . Gout   . Hemodialysis-associated hypotension   . HLD (hyperlipidemia)   . Hypertension   . IBS (irritable bowel syndrome)   . Insomnia   . Morbid obesity ([place])   . PAF (paroxysmal atrial fibrillation) ([place])   . Peripheral neuropathy ([place])   . PONV (postoperative nausea and vomiting)   . Renal cyst   . Renal insufficiency 10/30/2005   doppler - abn resistance consistent w/ parenchymal disease  . Renovascular hypertension   . Type 2 diabetes mellitus with diabetic neuropathy, with long-term current use of insulin ([place])   . Vertigo    Past Surgical History:  Past Surgical History:   Procedure Laterality Date  . ANGIOPLASTY Left 02/12/2015   Procedure: ANGIOPLASTY;  Surgeon: Angelia Mould, MD;  Location: [facility] CATH LAB;  Service: Cardiovascular;  Laterality: Left;  AVF  . AV FISTULA PLACEMENT Left 11/30/2014   Procedure: ARTERIOVENOUS (AV) FISTULA CREATION LEFT ARM;  Surgeon: Angelia Mould, MD;  Location: [place];  Service: Vascular;  Laterality: Left;  . AV FISTULA PLACEMENT Left 03/20/2015   Procedure:  Inserton of Left Upper Arm Gortex Graft;  Surgeon: Angelia Mould, MD;  Location: [place];  Service: Vascular;  Laterality: Left;  . AV FISTULA PLACEMENT Left 04/12/2015   Procedure: INSERTION OF LEFT ARM  ARTERIOVENOUS GORE-TEX GRAFT ;  Surgeon: Angelia Mould, MD;  Location: [place];  Service: Vascular;  Laterality: Left;  . BACK SURGERY  ~1980  . CHOLECYSTECTOMY    . ESOPHAGOGASTRODUODENOSCOPY N/A 10/24/2014   Procedure: ESOPHAGOGASTRODUODENOSCOPY (EGD);  Surgeon: Jerene Bears, MD;  Location: [facility] ENDOSCOPY;  Service: Endoscopy;  Laterality: N/A;  . EXCHANGE OF A DIALYSIS CATHETER Left 11/30/2014   Procedure: EXCHANGE OF A DIALYSIS CATHETER, LEFT INTERNAL JUGULAR;  Surgeon: Angelia Mould, MD;  Location: [place];  Service: Vascular;  Laterality: Left;  . EYE SURGERY    . FISTULOGRAM N/A 02/12/2015   Procedure: FISTULOGRAM;  Surgeon: Angelia Mould, MD;  Location: [facility] CATH LAB;  Service: Cardiovascular;  Laterality: N/A;  . HEMORRHOID SURGERY    .  LIGATION OF ARTERIOVENOUS  FISTULA Left 03/20/2015   Procedure: LIGATION OF ARTERIOVENOUS  FISTULA;  Surgeon: Angelia Mould, MD;  Location: [place];  Service: Vascular;  Laterality: Left;  Marland Kitchen MASTECTOMY Bilateral ~1982   bil breast reconstruction with implants  . ORIF TIBIA & FIBULA FRACTURES Left 2007   also had left non displaced malleolar fracture.   Marland Kitchen REMOVAL OF GRAFT Left 04/12/2015   Procedure: REMOVAL OF LEFT ARM ARTERIOVENOUS GORE-TEX GRAFT;  Surgeon: Angelia Mould, MD;   Location: [facility] OR;  Service: Vascular;  Laterality: Left;   HPI:  Pt is a 81 year old femalae admitted for sepsis from left lower lobe pna and influenza A. Pt has a sacral ulcer, ESRD   Assessment / Plan / Recommendation Clinical Impression  Pt demosntrates adequate airway protection with swallowing, observed to tolerate 3 oz of thin liquids consumed consecutively via straws with no signs of aspiration. Pt denies difficutly swallowing other than a history of reflux and long term use of reflux medications. Given chronic bedbound status, poor missing dentition, inability to tolerate upright positioning and history of use of reflux mediations, pt is at high risk of aspiration pneumonia due to microaspriation of oral flora or gastric contents. SLP demonstrated appropriate oral care with pt, removing dentures assisting her in brushing teeth. Discussed importance of oral care in reducing risk of bacterial infections and addressed appropriate frequency of oral care and encouraged pt to request assistance from hospital and SNF staff. Pt verbalized understanding. Pt may continue current diet, SLP will sign off.     Aspiration Risk  Mild aspiration risk    Diet Recommendation Regular;Thin liquid   Liquid Administration via: Cup;Straw Medication Administration: Whole meds with liquid Supervision: Patient able to self feed;Comment (assist to sit upright, complete oral care) Compensations: Slow rate;Small sips/bites Postural Changes: Seated upright at 90 degrees    Other  Recommendations Oral Care Recommendations: Oral care before and after PO;Patient independent with oral care (needs set up assist)   Follow up Recommendations None      Frequency and Duration            Prognosis        Swallow Study   General HPI: Pt is a 81 year old femalae admitted for sepsis from left lower lobe pna and influenza A. Pt has a sacral ulcer, ESRD Type of Study: Bedside Swallow Evaluation Previous Swallow  Assessment: none Diet Prior to this Study: Regular;Thin liquids Temperature Spikes Noted: No Respiratory Status: Room air History of Recent Intubation: No Behavior/Cognition: Alert;Cooperative;Pleasant mood Oral Cavity Assessment: Within Functional Limits Oral Care Completed by SLP: Yes Oral Cavity - Dentition: Dentures, top;Dentures, bottom Vision: Functional for self-feeding Self-Feeding Abilities: Able to feed self Patient Positioning: Partially reclined Baseline Vocal Quality: Normal Volitional Cough: Strong Volitional Swallow: Able to elicit    Oral/Motor/Sensory Function Overall Oral Motor/Sensory Function: Within functional limits   Ice Chips     Thin Liquid Thin Liquid: Within functional limits Presentation: Straw;Self Fed    Nectar Thick Nectar Thick Liquid: Not tested   Honey Thick Honey Thick Liquid: Not tested   Puree Puree: Within functional limits Presentation: Spoon   Solid   GO   Solid: Within functional limits Presentation: [place] [employer] [place], MA CCC-SLP [phone]  Chistopher Mangino, Katherene Ponto 01/21/2017,10:44 AM

## 2017-01-21 NOTE — Progress Notes (Signed)
PROGRESS NOTE  [name] James CLE:[id number] DOB: 31-Jul-1936 DOA: 01/19/2017 PCP: [name], MD  HPI/Recap of past 24 hours:  Pt has no new complaints  Assessment/Plan: Principal Problem:   Sepsis ([place]) Active Problems:   Essential hypertension   Anemia   ESRD (end stage renal disease) (HCC)   Paroxysmal atrial fibrillation ([place])   Controlled type 2 diabetes mellitus with diabetic polyneuropathy, with long-term current use of insulin (HCC)   HCAP (healthcare-associated pneumonia)   Pressure ulcer  1. Sepsis from pneumonia:  Source of infection left lower lobe pna and influenza A. Organism unknown.  Sputum Culture and urine pna antigen test need to be collected, I have informed RN at bedside.  Patient meets criteria given tachypnea, leukocytosis, and evidence of organ dysfunction.  Lactate 2.2 mmol/L and repeat ordered within 6 hours.  This patient is at high risk of poor outcomes with a qSOFA score of 2.  Antibiotics delivered in the ED.  she is admitted to stepdown,  -Sepsis bundle utilized:             -Blood and urine cultures drawn             -30 ml/kg bolus given in ED, will repeat lactic acid             -Antibiotics: vancomycin and cefepime             -Will defer atypical coverage because recent doxycycline/appropriate treatment             -Oseltamivir renal dosing             -sputum and Strep pneumo and legionella antigens ordered, pending collection -swallow eval, aspiration precaution               2. Hypertension: presented with hypotension/sepsis On metoprolol daily, midodrine for intradialytic hypotension -patient presented with hypotension, sbp in 80's initial on presentation, Hold metoprolol until hemodynamics clearer  3. ESRD on HD MWF:  -Continue Lanthanum -Continue midodrine with dialysis -nephrology consulted  4. Pressure ulcer, presented on admission -Stage 2 Pressure injury sacrum with non blanchable redness over the buttocks.   presented on admission.  Patient is incontinent of stool, so the redness also can be related to MASD  Wound care input appreciated  5. Diet controlled Diabetes:  Not on antidiabetics.  6. pAF:  CHADS2Vasc 7.  Not on warfarin because of risk of bleeding. -Continue amiodarone -Continue full aspirin  7. Other medications:  -Continue allopurinol and PPI  8. Anemia: Stable at baseline  9. Chronic diastolic CHF: Currently, seems euvolemic. -Strict I/Os -no edema, volume managed by dialysis  10. Elevated LFTs: No abdominal pain or nausea.  Presumably from sepsis/influenza. -Trend LFTs , check ck, hepatitis panel, abdominal US.  11. Hypokalemia - replace and reassess   FTT: baseline bed to wheelchair bound, need lift for transfer.  DVT prophylaxis: Lovenox    Code Status: full  Family Communication: patient   Disposition Plan: transfer to floor.   Consultants:  nephrology  Procedures:  Dialysis MWF  Antibiotics:  Vanc/cefepime    Objective: BP (!) 116/45 (BP Location: Right Arm)   Pulse 67   Temp 98.1 F (36.7 C) (Oral)   Resp (!) 22   Ht 5\' 3"  (1.6 m)   Wt 73.7 kg (162 lb 7.7 oz)   SpO2 (!) 10%   BMI 28.78 kg/m   Intake/Output Summary (Last 24 hours) at 01/21/17 1612 Last data filed at 01/21/17 0945  Gross per  24 hour  Intake              723 ml  Output               50 ml  Net              673 ml   Filed Weights   01/20/17 1200 01/21/17 0500 01/21/17 1315  Weight: 73.4 kg (161 lb 13.1 oz) 73.5 kg (162 lb 0.6 oz) 73.7 kg (162 lb 7.7 oz)    Exam:   General:  Frail, alert and awake, NAD  Cardiovascular: RRR, no rubs  Respiratory: +rhonchi, crackles left lower lobe  Abdomen: Soft/ND/NT, positive BS  Musculoskeletal: No Edema  Neuro: aaox3  Data Reviewed: Basic Metabolic Panel:  Recent Labs Lab 01/19/17 1725 01/20/17 0634 01/21/17 0436  NA 136 137 135  K 3.9 3.1* 3.1*  CL 95* 101 101  CO2 30 26 25   GLUCOSE 108*  81 91  BUN 7 11 17   CREATININE 1.88* 2.18* 2.85*  CALCIUM 7.5* 6.7* 6.9*   Liver Function Tests:  Recent Labs Lab 01/19/17 1725 01/20/17 0634 01/21/17 0436  AST 272* 222* 196*  ALT 158* 134* 118*  ALKPHOS 149* 106 108  BILITOT 1.3* 1.1 1.0  PROT 6.7 5.3* 5.1*  ALBUMIN 1.9* 1.6* 1.4*   No results for input(s): LIPASE, AMYLASE in the last 168 hours. No results for input(s): AMMONIA in the last 168 hours. CBC:  Recent Labs Lab 01/19/17 1725 01/20/17 0634  WBC 12.5* 9.6  NEUTROABS 8.1*  --   HGB 11.2* 9.4*  HCT 36.5 31.1*  MCV 99.7 100.0  PLT 166 104*   Cardiac Enzymes:    Recent Labs Lab 01/21/17 0436  CKTOTAL 982*   BNP (last 3 results)  Recent Labs  01/20/17 0634  BNP 451.4*    ProBNP (last 3 results) No results for input(s): PROBNP in the last 8760 hours.  CBG:  Recent Labs Lab 01/20/17 1220 01/20/17 1625 01/20/17 2138 01/21/17 0818 01/21/17 1158  GLUCAP 102* 91 106* 84 110*    Recent Results (from the past 240 hour(s))  Culture, blood (Routine x 2)     Status: None (Preliminary result)   Collection Time: 01/19/17  5:22 PM  Result Value Ref Range Status   Specimen Description BLOOD RIGHT ANTECUBITAL  Final   Special Requests BOTTLES DRAWN AEROBIC AND ANAEROBIC 5CC  Final   Culture NO GROWTH 2 DAYS  Final   Report Status PENDING  Incomplete  MRSA PCR Screening     Status: None   Collection Time: 01/20/17 12:10 PM  Result Value Ref Range Status   MRSA by PCR NEGATIVE NEGATIVE Final    Comment:        The GeneXpert MRSA Assay (FDA approved for NASAL specimens only), is one component of a comprehensive MRSA colonization surveillance program. It is not intended to diagnose MRSA infection nor to guide or monitor treatment for MRSA infections.   Culture, sputum-assessment     Status: None   Collection Time: 01/20/17 10:31 PM  Result Value Ref Range Status   Specimen Description EXPECTORATED SPUTUM  Final   Special Requests NONE   Final   Sputum evaluation   Final    Sputum specimen not acceptable for testing.  Please recollect.   Gram Stain Report Called to,Read Back By and Verified With: MILLER,RN @0132  01/21/17 MKELLY,MLT    Report Status 01/21/2017 FINAL  Final     Studies: US Abdomen Limited Ruq  Result Date: 01/21/2017 CLINICAL DATA:  Elevated liver function studies. Previous cholecystectomy. EXAM: US ABDOMEN LIMITED - RIGHT UPPER QUADRANT COMPARISON:  Abdominal and pelvic CT scan of January 22, 2016 FINDINGS: Gallbladder: The gallbladder is surgically absent. Common bile duct: Diameter: 4 mm Liver: The hepatic echotexture is mildly heterogeneous. There is no discrete mass or ductal dilation. The surface contour of the liver is normal. Incidental note is made of a right pleural effusion. There is also a right renal cyst in the upper pole laterally measuring 1.9 x 1.9 x 2.2 cm. The pancreatic duct is prominent measuring 4.4 mm in diameter. IMPRESSION: Previous cholecystectomy. Normal calibered common bile duct but mild prominence of the pancreatic duct of uncertain etiology. Heterogeneous hepatic echotexture without discrete mass or intrahepatic ductal dilation. If the elevated liver function studies remain increased, hepatic protocol MRI would be a useful next imaging step. Right pleural effusion. Electronically Signed   By: David  Martinique M.D.   On: 01/21/2017 09:10    Scheduled Meds: . allopurinol  100 mg Oral Daily  . amiodarone  200 mg Oral Daily  . aspirin  325 mg Oral Daily  . atorvastatin  80 mg Oral q1800  . ceFEPime (MAXIPIME) IV  1 g Intravenous Q24H  . dextromethorphan-guaiFENesin  1 tablet Oral BID  . feeding supplement (PRO-STAT SUGAR FREE 64)  30 mL Oral BID  . heparin  5,000 Units Subcutaneous Q8H  . insulin aspart  0-5 Units Subcutaneous QHS  . insulin aspart  0-9 Units Subcutaneous TID WC  . lanthanum  1,000 mg Oral TID WC  . mouth rinse  15 mL Mouth Rinse BID  . midodrine  5 mg Oral Q M,W,F-HD   . multivitamin  1 tablet Oral QHS  . oseltamivir  30 mg Oral Q M,W,F  . pantoprazole  40 mg Oral Daily  . Vancomycin      . vancomycin  750 mg Intravenous Q M,W,F-HD    Continuous Infusions:   Time spent: 19mins  Velvet Bathe MD, PhD  [facility] Pager [phone] If 7PM-7AM, please contact night-coverage at [email or web address], password [facility] 01/21/2017, 4:12 PM  LOS: 2 days

## 2017-01-21 NOTE — Progress Notes (Signed)
[facility] Progress Note    Assessment/ Plan:   Dialysis Orders: MWF AF 4 hr 72.5 - leaving ~72 very low gains 2 K 2.25 Ca  Heparin 3300 left AVGG no calcitriol or Fe, ESA Recent labs: hgb 11 22% sat ferrtin 2610 iPTH 180 alb 2.2 P 3 Access Hx - PTA 80 - 90% outflo ax vein stenosis and 70% venous stenosis 12/19/17- post procedure AF 600s which dropped to 300s twice in January (this was her pre procedure AF range- kinetics however great)   1. Sepsis/PNAInfluenza A - on tamiflu - other abtx per primary; lying near flat on back - discussed with RN - who has tried to get her sitting more vertically but pt insists it is more comfortable to be reclining more - difficult to cough adequately in this position - still feeling very weak which is understandable. 2. ESRD -  MWF - HD today -> changed to a 4k bath.  3. Hypertension/volume  - chronically low on midodrine - needs EDW lowered - not eating 4. Anemia  - hgb stable - hold on ESA for now 5. Metabolic bone disease -  No indication for VDRA, eating poorly - on fosrenol. - will check phos with next blood draw 6. Severe protein malnutrition - liberalize diet to regular due to poor intake (she is on this at [facility]) supplements cause diarrhea by report - add prosta/vitamins; encouraged her to at least sip liquids 7. PAF - on BB 8. Increased LFT - follow - ? Acute illness; lower volume may have a degree on liver congestion; on statin, but not likely etiology  Subjective:   Still feeling very weak. She's basically bedbound for a long period of time. Non productive cough overnight but denies dyspnea or cp. Very poor appetite.   Objective:   BP (!) 120/53 (BP Location: Right Arm)   Pulse 63   Temp 97.7 F (36.5 C) (Oral)   Resp (!) 21   Ht 5\' 3"  (1.6 m)   Wt 73.5 kg (162 lb 0.6 oz)   SpO2 93%   BMI 28.70 kg/m   Intake/Output Summary (Last 24 hours) at 01/21/17 1007 Last data filed at 01/21/17 0945  Gross per 24 hour  Intake               843 ml  Output               50 ml  Net              793 ml   Weight change:   Physical Exam: General: ill appearing frail elderly WF Head: NCAT sclera not icteric MMM Lungs: coarse BS anteriorly - weak effort, cough is weak Breathing is unlabored. Heart: RRR with S1 S2.  Abdomen: soft NT + BS Lower extremities: no sig without edema  Dialysis Access: left AVG + bruit.  Imaging: Dg Chest Port 1 View  Result Date: 01/19/2017 CLINICAL DATA:  Pneumonia for 3 weeks, not improved on antibiotics. EXAM: PORTABLE CHEST 1 VIEW COMPARISON:  12/18/2017 FINDINGS: Left base consolidation, consistent with pneumonia. Right lung is clear. Probable small left pleural effusion. Pulmonary vasculature is normal. Hilar, mediastinal and cardiac contours are unremarkable. IMPRESSION: Left base consolidation and probable small effusion, consistent with pneumonia. Followup PA and lateral chest X-ray is recommended in 3-4 weeks following trial of antibiotic therapy to ensure resolution and exclude underlying malignancy. Electronically Signed   By: Andreas Newport M.D.   On: 01/19/2017 21:28   US Abdomen  Limited Ruq  Result Date: 01/21/2017 CLINICAL DATA:  Elevated liver function studies. Previous cholecystectomy. EXAM: US ABDOMEN LIMITED - RIGHT UPPER QUADRANT COMPARISON:  Abdominal and pelvic CT scan of January 22, 2016 FINDINGS: Gallbladder: The gallbladder is surgically absent. Common bile duct: Diameter: 4 mm Liver: The hepatic echotexture is mildly heterogeneous. There is no discrete mass or ductal dilation. The surface contour of the liver is normal. Incidental note is made of a right pleural effusion. There is also a right renal cyst in the upper pole laterally measuring 1.9 x 1.9 x 2.2 cm. The pancreatic duct is prominent measuring 4.4 mm in diameter. IMPRESSION: Previous cholecystectomy. Normal calibered common bile duct but mild prominence of the pancreatic duct of uncertain etiology. Heterogeneous  hepatic echotexture without discrete mass or intrahepatic ductal dilation. If the elevated liver function studies remain increased, hepatic protocol MRI would be a useful next imaging step. Right pleural effusion. Electronically Signed   By: David  Martinique M.D.   On: 01/21/2017 09:10    Labs: BMET  Recent Labs Lab 01/19/17 1725 01/20/17 0634 01/21/17 0436  NA 136 137 135  K 3.9 3.1* 3.1*  CL 95* 101 101  CO2 30 26 25   GLUCOSE 108* 81 91  BUN 7 11 17   CREATININE 1.88* 2.18* 2.85*  CALCIUM 7.5* 6.7* 6.9*   CBC  Recent Labs Lab 01/19/17 1725 01/20/17 0634  WBC 12.5* 9.6  NEUTROABS 8.1*  --   HGB 11.2* 9.4*  HCT 36.5 31.1*  MCV 99.7 100.0  PLT 166 104*    Medications:    . allopurinol  100 mg Oral Daily  . amiodarone  200 mg Oral Daily  . aspirin  325 mg Oral Daily  . atorvastatin  80 mg Oral q1800  . ceFEPime (MAXIPIME) IV  1 g Intravenous Q24H  . dextromethorphan-guaiFENesin  1 tablet Oral BID  . feeding supplement (PRO-STAT SUGAR FREE 64)  30 mL Oral BID  . heparin  5,000 Units Subcutaneous Q8H  . insulin aspart  0-5 Units Subcutaneous QHS  . insulin aspart  0-9 Units Subcutaneous TID WC  . lanthanum  1,000 mg Oral TID WC  . mouth rinse  15 mL Mouth Rinse BID  . midodrine  5 mg Oral Q M,W,F-HD  . multivitamin  1 tablet Oral QHS  . oseltamivir  30 mg Oral Q M,W,F  . pantoprazole  40 mg Oral Daily  . vancomycin  750 mg Intravenous Q M,W,F-HD      Otelia Santee, MD 01/21/2017, 10:07 AM

## 2017-01-21 NOTE — Consult Note (Signed)
[facility] Nurse wound consult note Pt was seen yesterday for stage ll sacral ulcer.  Spoke with bedside nurse who states she believes the order was placed for consult because they were not aware she had been seen yesterday.  Will not follow but please reconsult if needed.   Fara Olden, RN-C, WTA-C Wound Treatment Associate

## 2017-01-21 NOTE — Progress Notes (Signed)
[facility] ([facility]), Notified of patient blood pressure 99/37 MAP 56. Orders to give 250 NS bolus.

## 2017-01-22 LAB — BASIC METABOLIC PANEL
ANION GAP: 5 (ref 5–15)
BUN: 5 mg/dL — ABNORMAL LOW (ref 6–20)
CHLORIDE: 102 mmol/L (ref 101–111)
CO2: 30 mmol/L (ref 22–32)
Calcium: 7.1 mg/dL — ABNORMAL LOW (ref 8.9–10.3)
Creatinine, Ser: 1.65 mg/dL — ABNORMAL HIGH (ref 0.44–1.00)
GFR calc Af Amer: 33 mL/min — ABNORMAL LOW (ref >60)
GFR, EST NON AFRICAN AMERICAN: 28 mL/min — AB (ref >60)
GLUCOSE: 67 mg/dL (ref 65–99)
POTASSIUM: 4 mmol/L (ref 3.5–5.1)
Sodium: 137 mmol/L (ref 135–145)

## 2017-01-22 LAB — GLUCOSE, CAPILLARY
GLUCOSE-CAPILLARY: 97 mg/dL (ref 65–99)
GLUCOSE-CAPILLARY: 118 mg/dL — AB (ref 65–99)
Glucose-Capillary: 69 mg/dL (ref 65–99)
Glucose-Capillary: 119 mg/dL — ABNORMAL HIGH (ref 65–99)

## 2017-01-22 LAB — HEPATITIS PANEL, ACUTE
HCV Ab: <0.1 s/co ratio (ref 0.0–0.9)
HEP B C IGM: NEGATIVE
HEP B S AG: NEGATIVE
Hep A IgM: NEGATIVE

## 2017-01-22 LAB — LEGIONELLA PNEUMOPHILA SEROGP 1 UR AG: L. pneumophila Serogp 1 Ur Ag: NEGATIVE

## 2017-01-22 LAB — PHOSPHORUS: Phosphorus: 1.4 mg/dL — ABNORMAL LOW (ref 2.5–4.6)

## 2017-01-22 MED ORDER — MEGESTROL ACETATE 400 MG/10ML PO SUSP
200.0000 mg | Freq: Every day | ORAL | Status: DC
  Administered 2017-01-22 – 2017-02-03 (×11): 200 mg via ORAL
  Filled 2017-01-22 (×13): qty 5

## 2017-01-22 NOTE — Progress Notes (Signed)
PROGRESS NOTE  [name] James TKZ:[id number] DOB: 1936/07/31 DOA: 01/19/2017 PCP: [name], MD  HPI/Recap of past 24 hours:  Pt has no new complaints  Assessment/Plan: Principal Problem:   Sepsis ([place]) Active Problems:   Essential hypertension   Anemia   ESRD (end stage renal disease) (HCC)   Paroxysmal atrial fibrillation ([place])   Controlled type 2 diabetes mellitus with diabetic polyneuropathy, with long-term current use of insulin (HCC)   HCAP (healthcare-associated pneumonia)   Pressure ulcer  1. Sepsis from pneumonia:  Source of infection left lower lobe pna and influenza A. Organism unknown.  Sputum Culture and urine pna antigen test need to be collected, I have informed RN at bedside.  Patient meets criteria given tachypnea, leukocytosis, and evidence of organ dysfunction.  Lactate 2.2 mmol/L and repeat ordered within 6 hours.  This patient is at high risk of poor outcomes with a qSOFA score of 2.  Antibiotics delivered in the ED.  she is admitted to stepdown,  -Sepsis bundle utilized:             -Blood and urine cultures drawn             -30 ml/kg bolus given in ED, will repeat lactic acid             -Antibiotics: vancomycin and cefepime             -Will defer atypical coverage because recent doxycycline/appropriate treatment             -Oseltamivir renal dosing, as patient tested positive for flu  2. Hypertension: presented with hypotension/sepsis On metoprolol daily, midodrine for intradialytic hypotension -patient presented with hypotension, sbp in 80's initial on presentation, Hold metoprolol until hemodynamics clearer  3. ESRD on HD MWF:  -Continue midodrine with dialysis - nephrology consulted and assisting  4. Pressure ulcer, presented on admission -Stage 2 Pressure injury sacrum with non blanchable redness over the buttocks.  presented on admission.  Patient is incontinent of stool, so the redness also can be related to MASD  Wound care  input appreciated  5. Diet controlled Diabetes:  Not on antidiabetics.  6. pAF:  CHADS2Vasc 7.  Not on warfarin because of risk of bleeding. -Continue amiodarone -Continue full aspirin  7. Other medications:  -Continue allopurinol and PPI  8. Anemia: Stable at baseline  9. Chronic diastolic CHF: Currently, seems euvolemic. -Strict I/Os -no edema, volume managed by dialysis  10. Elevated LFTs: No abdominal pain or nausea.  Presumably from sepsis/influenza. -Trend LFTs , check ck, hepatitis panel, abdominal US.  11. Hypokalemia - replace and reassess   FTT: baseline bed to wheelchair bound, need lift for transfer.  DVT prophylaxis: Lovenox    Code Status: full  Family Communication: patient   Disposition Plan: transfer to floor.   Consultants:  nephrology  Procedures:  Dialysis MWF  Antibiotics:  Vanc/cefepime    Objective: BP (!) 118/39 (BP Location: Right Arm)   Pulse 68   Temp 97.7 F (36.5 C) (Oral)   Resp 19   Ht 5\' 3"  (1.6 m)   Wt 75.9 kg (167 lb 5.3 oz)   SpO2 98%   BMI 29.64 kg/m   Intake/Output Summary (Last 24 hours) at 01/22/17 1504 Last data filed at 01/22/17 0236  Gross per 24 hour  Intake              200 ml  Output             1003  ml  Net             -803 ml   Filed Weights   01/21/17 1315 01/21/17 1733 01/22/17 0303  Weight: 73.7 kg (162 lb 7.7 oz) 72 kg (158 lb 11.7 oz) 75.9 kg (167 lb 5.3 oz)    Exam:   General:  Frail, alert and awake, NAD  Cardiovascular: RRR, no rubs  Respiratory: +rhonchi, crackles left lower lobe  Abdomen: Soft/ND/NT, positive BS  Musculoskeletal: No Edema  Neuro: aaox3  Data Reviewed: Basic Metabolic Panel:  Recent Labs Lab 01/19/17 1725 01/20/17 0634 01/21/17 0436 01/22/17 0656  NA 136 137 135 137  K 3.9 3.1* 3.1* 4.0  CL 95* 101 101 102  CO2 30 26 25 30   GLUCOSE 108* 81 91 67  BUN 7 11 17  5*  CREATININE 1.88* 2.18* 2.85* 1.65*  CALCIUM 7.5* 6.7* 6.9* 7.1*    PHOS  --   --   --  1.4*   Liver Function Tests:  Recent Labs Lab 01/19/17 1725 01/20/17 0634 01/21/17 0436  AST 272* 222* 196*  ALT 158* 134* 118*  ALKPHOS 149* 106 108  BILITOT 1.3* 1.1 1.0  PROT 6.7 5.3* 5.1*  ALBUMIN 1.9* 1.6* 1.4*   No results for input(s): LIPASE, AMYLASE in the last 168 hours. No results for input(s): AMMONIA in the last 168 hours. CBC:  Recent Labs Lab 01/19/17 1725 01/20/17 0634  WBC 12.5* 9.6  NEUTROABS 8.1*  --   HGB 11.2* 9.4*  HCT 36.5 31.1*  MCV 99.7 100.0  PLT 166 104*   Cardiac Enzymes:    Recent Labs Lab 01/21/17 0436  CKTOTAL 982*   BNP (last 3 results)  Recent Labs  01/20/17 0634  BNP 451.4*    ProBNP (last 3 results) No results for input(s): PROBNP in the last 8760 hours.  CBG:  Recent Labs Lab 01/21/17 0818 01/21/17 1158 01/21/17 2143 01/22/17 0803 01/22/17 1127  GLUCAP 84 110* 81 69 97    Recent Results (from the past 240 hour(s))  Culture, blood (Routine x 2)     Status: None (Preliminary result)   Collection Time: 01/19/17  5:22 PM  Result Value Ref Range Status   Specimen Description BLOOD RIGHT ANTECUBITAL  Final   Special Requests BOTTLES DRAWN AEROBIC AND ANAEROBIC 5CC  Final   Culture NO GROWTH 2 DAYS  Final   Report Status PENDING  Incomplete  MRSA PCR Screening     Status: None   Collection Time: 01/20/17 12:10 PM  Result Value Ref Range Status   MRSA by PCR NEGATIVE NEGATIVE Final    Comment:        The GeneXpert MRSA Assay (FDA approved for NASAL specimens only), is one component of a comprehensive MRSA colonization surveillance program. It is not intended to diagnose MRSA infection nor to guide or monitor treatment for MRSA infections.   Culture, sputum-assessment     Status: None   Collection Time: 01/20/17 10:31 PM  Result Value Ref Range Status   Specimen Description EXPECTORATED SPUTUM  Final   Special Requests NONE  Final   Sputum evaluation   Final    Sputum specimen  not acceptable for testing.  Please recollect.   Gram Stain Report Called to,Read Back By and Verified With: MILLER,RN @0132  01/21/17 MKELLY,MLT    Report Status 01/21/2017 FINAL  Final     Studies: No results found.  Scheduled Meds: . allopurinol  100 mg Oral Daily  . amiodarone  200 mg  Oral Daily  . aspirin  325 mg Oral Daily  . atorvastatin  80 mg Oral q1800  . ceFEPime (MAXIPIME) IV  1 g Intravenous Q24H  . dextromethorphan-guaiFENesin  1 tablet Oral BID  . feeding supplement (PRO-STAT SUGAR FREE 64)  30 mL Oral BID  . heparin  5,000 Units Subcutaneous Q8H  . insulin aspart  0-5 Units Subcutaneous QHS  . insulin aspart  0-9 Units Subcutaneous TID WC  . lanthanum  1,000 mg Oral TID WC  . mouth rinse  15 mL Mouth Rinse BID  . megestrol  200 mg Oral Daily  . midodrine  5 mg Oral Q M,W,F-HD  . multivitamin  1 tablet Oral QHS  . oseltamivir  30 mg Oral Q M,W,F  . pantoprazole  40 mg Oral Daily  . potassium chloride  40 mEq Oral Once  . vancomycin  750 mg Intravenous Q M,W,F-HD    Continuous Infusions:   Time spent: 38mins  Velvet Bathe MD, PhD  [facility] Pager [phone] If 7PM-7AM, please contact night-coverage at [email or web address], password [facility] 01/22/2017, 3:04 PM  LOS: 3 days

## 2017-01-22 NOTE — Progress Notes (Addendum)
Pharmacy Antibiotic Note  [name] James is a 81 y.o. female admitted on 01/19/2017 with pneumonia. Failed outpatient treatment with doxycycline and moxifloxacin.  Pharmacy has been consulted for vancomycin and aztreonam dosing. She is ESRD on HD MWF, last full session 1/31, received vancomycin appropriately at end of session.  Vancomycin pre-HD level 15-25  Plan: 1) Vancomycin 750mg  IV qHD MWF - planned stop date 2/7. 2) Cefepime 1g q 24hr- planned stop date 2/7. 3) Follow HD schedule/tolerance.  Height: 5\' 3"  (160 cm) Weight: 167 lb 5.3 oz (75.9 kg) IBW/kg (Calculated) : 52.4  Temp (24hrs), Avg:97.9 F (36.6 C), Min:97.7 F (36.5 C), Max:98.2 F (36.8 C)   Recent Labs Lab 01/19/17 1725 01/19/17 1744 01/19/17 2127 01/20/17 0634 01/21/17 0436 01/22/17 0656  WBC 12.5*  --   --  9.6  --   --   CREATININE 1.88*  --   --  2.18* 2.85* 1.65*  LATICACIDVEN  --  1.83 2.27*  --  0.9  --     Estimated Creatinine Clearance: 27 mL/min (by C-G formula based on SCr of 1.65 mg/dL (H)).    Allergies  Allergen Reactions  . Erythromycin Swelling  . Erythromycin Base     Other reaction(s): GI Upset (intolerance)  . Penicillins Other (See Comments)    Unknown allergic reaction.  She has taken keflex and Rocephin many times without problems  . Codeine Rash and Palpitations    Other reaction(s): GI Upset (intolerance)  . Penicillin G Rash    Antimicrobials this admission:  1/29 Azith x1 1/29 Aztreonam x1 1/29 Vanc>>(2/7) 1/29 Tamiflu>>(2/5) 1/30 Cefepime>>(2/7)  Microbiology results:  1/29 BCx: ngtd 1/29 Flu A PCR: pos  1/30 MRSA PCR: neg 1/31 sputum ng 1/31 strep urine +  Uvaldo Rising, BCPS  Clinical Pharmacist Pager [phone]  01/22/2017 1:40 PM

## 2017-01-22 NOTE — Progress Notes (Signed)
Pharmacist Heart Failure Core Measure Documentation  Assessment: [name] James has an EF documented as 55-60% on 12/19/2014 by ECHO.  Rationale: Heart failure patients with left ventricular systolic dysfunction (LVSD) and an EF < 40% should be prescribed an angiotensin converting enzyme inhibitor (ACEI) or angiotensin receptor blocker (ARB) at discharge unless a contraindication is documented in the medical record.  This patient is not currently on an ACEI or ARB for HF.  This note is being placed in the record in order to provide documentation that a contraindication to the use of these agents is present for this encounter.  ACE Inhibitor or Angiotensin Receptor Blocker is contraindicated (specify all that apply)  []   ACEI allergy AND ARB allergy []   Angioedema []   Moderate or severe aortic stenosis []   Hyperkalemia []   Hypotension []   Renal artery stenosis [x]   Worsening renal function, preexisting renal disease or dysfunction   Pat Patrick 01/22/2017 3:14 PM

## 2017-01-22 NOTE — Progress Notes (Addendum)
Patient arrived to unit at approximately 1630; transferred from 4n.  Assessment is as charted. Denies pain at this time. IV saline locked. Droplet precautions continued. Telemetry placed and verified with CCMD.

## 2017-01-22 NOTE — Progress Notes (Signed)
[facility] Progress Note    Assessment/ Plan:   Dialysis Orders:MWF AF 4 hr 72.5 - leaving ~72 very low gains 2 K 2.25 Ca Heparin 3300 left AVGG no calcitriol or Fe, ESA Recent labs: hgb 11 22% sat ferrtin 2610 iPTH 180 alb 2.2 P 3 Access Hx - PTA 80 - 90% outflo ax vein stenosis and 70% venous stenosis 12/19/17- post procedure AF 600s which dropped to 300s twice in January (this was her pre procedure AF range- kinetics however great)   1. Sepsis/PNAInfluenza A- on tamiflu- other abtx per primary; lying near flat on back - discussed with RN - who has tried to get her sitting more vertically but pt insists it is more comfortable to be reclining more - difficult to cough adequately in this position - still feeling very weak which is understandable. 2. ESRD- MWF - HD tomorrow --> will try NO heparin tomorrow as tolerated.  3. Hypertension/volume- chronically low on midodrine - needs EDW lowered - not eating 4. Anemia- hgb stable - hold on ESA for now 5. Metabolic bone disease- No indication for VDRA, eating poorly - on fosrenol. - will check phos with next blood draw 6. Severe protein malnutrition- liberalize diet to regular due to poor intake (she is on this at [facility]) supplements cause diarrhea by report - add prosta/vitamins; encouraged her to at least sip liquids. Will try Megace daily as well. Very poor appetite. 7. PAF- on BB 8. Increased LFT - follow - ? Acute illness; lower volume may have a degree on liver congestion; on statin, but not likely etiology  Subjective:   Still feeling very weak. She's basically bedbound for a long period of time. Non productive cough overnight but denies dyspnea or cp. Very poor appetite.  Had some bleeding post HD yesterday but currently no e/o active extravasation.   Objective:   BP (!) 123/45 (BP Location: Right Arm) Comment: Simultaneous filing. User may not have seen previous data.  Pulse 67 Comment: Simultaneous  filing. User may not have seen previous data.  Temp 97.7 F (36.5 C) (Oral)   Resp (!) 21 Comment: Simultaneous filing. User may not have seen previous data.  Ht 5\' 3"  (1.6 m)   Wt 75.9 kg (167 lb 5.3 oz)   SpO2 100% Comment: Simultaneous filing. User may not have seen previous data.  BMI 29.64 kg/m   Intake/Output Summary (Last 24 hours) at 01/22/17 0748 Last data filed at 01/22/17 0236  Gross per 24 hour  Intake              703 ml  Output             1003 ml  Net             -300 ml   Weight change: 0.3 kg (10.6 oz)  Physical Exam: General:ill appearing frail elderly WF Head:NCAT sclera not icteric MMM Lungs: coarse BS anteriorly - weak effort, cough is weakBreathing is unlabored. Heart:RRR with S1 S2.  Abdomen: soft NT + BS Lower extremities: no sig without edema  Dialysis Access: left AVG + bruit, heavily bandaged  Imaging: US Abdomen Limited Ruq  Result Date: 01/21/2017 CLINICAL DATA:  Elevated liver function studies. Previous cholecystectomy. EXAM: US ABDOMEN LIMITED - RIGHT UPPER QUADRANT COMPARISON:  Abdominal and pelvic CT scan of January 22, 2016 FINDINGS: Gallbladder: The gallbladder is surgically absent. Common bile duct: Diameter: 4 mm Liver: The hepatic echotexture is mildly heterogeneous. There is no discrete mass or ductal  dilation. The surface contour of the liver is normal. Incidental note is made of a right pleural effusion. There is also a right renal cyst in the upper pole laterally measuring 1.9 x 1.9 x 2.2 cm. The pancreatic duct is prominent measuring 4.4 mm in diameter. IMPRESSION: Previous cholecystectomy. Normal calibered common bile duct but mild prominence of the pancreatic duct of uncertain etiology. Heterogeneous hepatic echotexture without discrete mass or intrahepatic ductal dilation. If the elevated liver function studies remain increased, hepatic protocol MRI would be a useful next imaging step. Right pleural effusion. Electronically Signed   By:  David  Martinique M.D.   On: 01/21/2017 09:10    Labs: BMET  Recent Labs Lab 01/19/17 1725 01/20/17 0634 01/21/17 0436  NA 136 137 135  K 3.9 3.1* 3.1*  CL 95* 101 101  CO2 30 26 25   GLUCOSE 108* 81 91  BUN 7 11 17   CREATININE 1.88* 2.18* 2.85*  CALCIUM 7.5* 6.7* 6.9*   CBC  Recent Labs Lab 01/19/17 1725 01/20/17 0634  WBC 12.5* 9.6  NEUTROABS 8.1*  --   HGB 11.2* 9.4*  HCT 36.5 31.1*  MCV 99.7 100.0  PLT 166 104*    Medications:    . allopurinol  100 mg Oral Daily  . amiodarone  200 mg Oral Daily  . aspirin  325 mg Oral Daily  . atorvastatin  80 mg Oral q1800  . ceFEPime (MAXIPIME) IV  1 g Intravenous Q24H  . dextromethorphan-guaiFENesin  1 tablet Oral BID  . feeding supplement (PRO-STAT SUGAR FREE 64)  30 mL Oral BID  . heparin  5,000 Units Subcutaneous Q8H  . insulin aspart  0-5 Units Subcutaneous QHS  . insulin aspart  0-9 Units Subcutaneous TID WC  . lanthanum  1,000 mg Oral TID WC  . mouth rinse  15 mL Mouth Rinse BID  . midodrine  5 mg Oral Q M,W,F-HD  . multivitamin  1 tablet Oral QHS  . oseltamivir  30 mg Oral Q M,W,F  . pantoprazole  40 mg Oral Daily  . potassium chloride  40 mEq Oral Once  . vancomycin  750 mg Intravenous Q M,W,F-HD      Otelia Santee, MD 01/22/2017, 7:48 AM

## 2017-01-22 NOTE — Care Management Important Message (Signed)
Important Message  Patient Details  Name: [name] James MRN: [id number] Date of Birth: 30-May-1936   Medicare Important Message Given:  Yes    Lacretia Leigh, RN 01/22/2017, 1:42 PM

## 2017-01-22 NOTE — [place] FL2 (Addendum)
[place] MEDICAID FL2 LEVEL OF CARE SCREENING TOOL     IDENTIFICATION  Patient Name: [name] James Birthdate: 10-25-1936 Sex: female Admission Date (Current Location): 01/19/2017  [facility] and [place] Number:  Herbalist and Address:  [facility]. [facility], [place] [address] [place], [place] [address]      Provider Number: [id number]  Attending Physician Name and Address:  Velvet Bathe, MD  Relative Name and Phone Number:  Kenney Houseman, daughter, [phone]    Current Level of Care: Hospital Recommended Level of Care: [place] Prior Approval Number:    Date Approved/Denied:   PASRR Number: [id number]  Discharge Plan: SNF    Current Diagnoses: Patient Active Problem List   Diagnosis Date Noted  . Sepsis ([place]) 01/19/2017  . HCAP (healthcare-associated pneumonia) 01/19/2017  . Pressure ulcer 01/19/2017  . Long term current use of amiodarone 10/17/2016  . History of pressure ulcer 10/17/2016  . GAD (generalized anxiety disorder) 10/17/2016  . Osteopenia determined by x-ray 04/17/2016  . Controlled type 2 diabetes mellitus with diabetic polyneuropathy, with long-term current use of insulin ([place]) 04/17/2016  . Type 2 diabetes mellitus with diabetic neuropathy ([place]) 06/08/2015  . Type 2 diabetes, controlled, with renal manifestation ([place]) 06/08/2015  . PVD (peripheral vascular disease) ([place]) 06/08/2015  . Swelling of limb-Left arm 04/10/2015  . Nausea with vomiting 01/12/2015  . AP (abdominal pain) 01/12/2015  . Normocytic anemia 01/12/2015  . Paroxysmal atrial fibrillation ([place]) 12/22/2014  . End stage renal disease ([place])   . Melena   . ESRD on dialysis ([place]) 12/19/2014  . CHF (congestive heart failure) ([place]) 12/19/2014  . Chest pain 12/19/2014  . Palpitation 12/19/2014  . ESRD (end stage renal disease) ([place]) 12/19/2014  . Anemia, chronic disease 12/13/2014  . Hemodialysis-associated hypotension 12/13/2014  . Acute renal failure  syndrome ([place])   . Pulmonary edema 11/10/2014  . Physical deconditioning 10/29/2014  . Iron deficiency anemia   . Acute esophagitis 10/24/2014  . Gastritis and gastroduodenitis 10/24/2014  . Heme positive stool 10/24/2014  . Acute encephalopathy 10/21/2014  . Morbid obesity ([place]) 10/18/2014  . Edema 10/18/2014  . AKI (acute kidney injury) ([place]) 10/17/2014  . Acute on chronic diastolic CHF (congestive heart failure) ([place]) 10/17/2014  . Essential hypertension 10/17/2014  . CKD (chronic kidney disease), stage III 10/17/2014  . DM type 2, uncontrolled, with renal complications ([place]) 19/37/9024  . Dyslipidemia 10/17/2014  . Anemia 10/17/2014    Orientation RESPIRATION BLADDER Height & Weight     Self, Time, Situation, Place  O2 (Nasal cannula 2L) Incontinent Weight: 75.9 kg (167 lb 5.3 oz) Height:  5\' 3"  (160 cm)  BEHAVIORAL SYMPTOMS/MOOD NEUROLOGICAL BOWEL NUTRITION STATUS      Incontinent Diet (Please see DC Summary)  AMBULATORY STATUS COMMUNICATION OF NEEDS Skin   Extensive Assist Verbally PU Stage and Appropriate Care (Stage II on sacrum)                       Personal Care Assistance Level of Assistance  Bathing, Feeding, Dressing Bathing Assistance: Maximum assistance Feeding assistance: Independent Dressing Assistance: Limited assistance     Functional Limitations Info             SPECIAL CARE FACTORS FREQUENCY   [place] 1L                     Contractures      Additional Factors Info  Insulin Sliding Scale Code Status  Info: Full Allergies Info:  Erythromycin, Erythromycin Base, Penicillins, Codeine, Penicillin G   Insulin Sliding Scale Info: 3x daily with meals and at bedtime Isolation Precautions Info: Droplet Precautions     Current Medications (01/22/2017):  This is the current hospital active medication list Current Facility-Administered Medications  Medication Dose Route Frequency Provider Last Rate Last Dose  . acetaminophen (TYLENOL) tablet  650 mg  650 mg Oral Q6H PRN Edwin Dada, MD       Or  . acetaminophen (TYLENOL) suppository 650 mg  650 mg Rectal Q6H PRN Edwin Dada, MD      . albuterol (PROVENTIL) (2.5 MG/3ML) 0.083% nebulizer solution 2.5 mg  2.5 mg Nebulization Q2H PRN Edwin Dada, MD      . allopurinol (ZYLOPRIM) tablet 100 mg  100 mg Oral Daily Edwin Dada, MD   100 mg at 01/22/17 0905  . amiodarone (PACERONE) tablet 200 mg  200 mg Oral Daily Edwin Dada, MD   200 mg at 01/22/17 0905  . aspirin EC tablet 325 mg  325 mg Oral Daily Edwin Dada, MD   325 mg at 01/22/17 0905  . atorvastatin (LIPITOR) tablet 80 mg  80 mg Oral q1800 Edwin Dada, MD   80 mg at 01/20/17 1720  . ceFEPIme (MAXIPIME) 1 g in dextrose 5 % 50 mL IVPB  1 g Intravenous Q24H Edwin Dada, MD 100 mL/hr at 01/22/17 0236 1 g at 01/22/17 0236  . dextromethorphan-guaiFENesin (MUCINEX DM) 30-600 MG per 12 hr tablet 1 tablet  1 tablet Oral BID Edwin Dada, MD   1 tablet at 01/22/17 0905  . diazepam (VALIUM) tablet 5 mg  5 mg Oral QHS PRN Edwin Dada, MD   5 mg at 01/21/17 2210  . feeding supplement (PRO-STAT SUGAR FREE 64) liquid 30 mL  30 mL Oral BID Alric Seton, PA-C   30 mL at 01/22/17 0905  . guaiFENesin-dextromethorphan (ROBITUSSIN DM) 100-10 MG/5ML syrup 10 mL  10 mL Oral Q4H PRN Velvet Bathe, MD   10 mL at 01/21/17 2210  . heparin injection 5,000 Units  5,000 Units Subcutaneous Q8H Edwin Dada, MD   5,000 Units at 01/22/17 0540  . insulin aspart (novoLOG) injection 0-5 Units  0-5 Units Subcutaneous QHS Edwin Dada, MD      . insulin aspart (novoLOG) injection 0-9 Units  0-9 Units Subcutaneous TID WC Edwin Dada, MD      . lanthanum (FOSRENOL) chewable tablet 1,000 mg  1,000 mg Oral TID WC Edwin Dada, MD   1,000 mg at 01/22/17 0905  . MEDLINE mouth rinse  15 mL Mouth Rinse BID Florencia Reasons, MD   15 mL at 01/21/17 2000  .  megestrol (MEGACE) 400 MG/10ML suspension 200 mg  200 mg Oral Daily Dwana Melena, MD   200 mg at 01/22/17 6606  . midodrine (PROAMATINE) tablet 5 mg  5 mg Oral Q M,W,F-HD Edwin Dada, MD   5 mg at 01/21/17 1225  . multivitamin (RENA-VIT) tablet 1 tablet  1 tablet Oral QHS Alric Seton, PA-C   1 tablet at 01/21/17 2155  . ondansetron (ZOFRAN) tablet 4 mg  4 mg Oral Q6H PRN Edwin Dada, MD       Or  . ondansetron (ZOFRAN) injection 4 mg  4 mg Intravenous Q6H PRN Edwin Dada, MD      . oseltamivir (TAMIFLU) capsule 30 mg  30 mg Oral Q M,W,F  Edwin Dada, MD      . pantoprazole (PROTONIX) EC tablet 40 mg  40 mg Oral Daily Edwin Dada, MD   40 mg at 01/22/17 0905  . potassium chloride SA (K-DUR,KLOR-CON) CR tablet 40 mEq  40 mEq Oral Once Velvet Bathe, MD      . vancomycin (VANCOCIN) IVPB 750 mg/150 ml premix  750 mg Intravenous Q M,W,F-HD Franky Macho, RPH   750 mg at 01/21/17 1630     Discharge Medications: Please see discharge summary for a list of discharge medications.  Relevant Imaging Results:  Relevant Lab Results:   Additional Information SSN: [id number]     HD MWF at [place],

## 2017-01-23 LAB — PROCALCITONIN: Procalcitonin: 0.64 ng/mL

## 2017-01-23 LAB — RENAL FUNCTION PANEL
Albumin: 1.5 g/dL — ABNORMAL LOW (ref 3.5–5.0)
Anion gap: 6 (ref 5–15)
BUN: 11 mg/dL (ref 6–20)
CHLORIDE: 102 mmol/L (ref 101–111)
CO2: 27 mmol/L (ref 22–32)
Calcium: 7.3 mg/dL — ABNORMAL LOW (ref 8.9–10.3)
Creatinine, Ser: 2.61 mg/dL — ABNORMAL HIGH (ref 0.44–1.00)
GFR, EST AFRICAN AMERICAN: 19 mL/min — AB (ref >60)
GFR, EST NON AFRICAN AMERICAN: 16 mL/min — AB (ref >60)
Glucose, Bld: 91 mg/dL (ref 65–99)
POTASSIUM: 4.2 mmol/L (ref 3.5–5.1)
Phosphorus: 1.8 mg/dL — ABNORMAL LOW (ref 2.5–4.6)
Sodium: 135 mmol/L (ref 135–145)

## 2017-01-23 LAB — GLUCOSE, CAPILLARY
Glucose-Capillary: 75 mg/dL (ref 65–99)
Glucose-Capillary: 101 mg/dL — ABNORMAL HIGH (ref 65–99)
Glucose-Capillary: 110 mg/dL — ABNORMAL HIGH (ref 65–99)

## 2017-01-23 LAB — CBC
HEMATOCRIT: 30.3 % — AB (ref 36.0–46.0)
Hemoglobin: 9.2 g/dL — ABNORMAL LOW (ref 12.0–15.0)
MCH: 30.1 pg (ref 26.0–34.0)
MCHC: 30.4 g/dL (ref 30.0–36.0)
MCV: 99 fL (ref 78.0–100.0)
Platelets: 119 10*3/uL — ABNORMAL LOW (ref 150–400)
RBC: 3.06 MIL/uL — AB (ref 3.87–5.11)
RDW: 17.8 % — ABNORMAL HIGH (ref 11.5–15.5)
WBC: 7.4 10*3/uL (ref 4.0–10.5)

## 2017-01-23 MED ORDER — MIDODRINE HCL 5 MG PO TABS
ORAL_TABLET | ORAL | Status: AC
  Administered 2017-01-23: 5 mg via ORAL
  Filled 2017-01-23: qty 1

## 2017-01-23 MED ORDER — LEVOFLOXACIN IN D5W 750 MG/150ML IV SOLN: 750.0000 mg | Freq: Once | INTRAVENOUS | Status: DC

## 2017-01-23 MED ORDER — LEVOFLOXACIN IN D5W 500 MG/100ML IV SOLN
500.0000 mg | INTRAVENOUS | Status: AC
  Administered 2017-01-25 – 2017-01-27 (×2): 500 mg via INTRAVENOUS
  Filled 2017-01-23 (×4): qty 100

## 2017-01-23 MED ORDER — LEVOFLOXACIN IN D5W 500 MG/100ML IV SOLN: 500.0000 mg | INTRAVENOUS | Status: DC

## 2017-01-23 MED ORDER — ALBUMIN HUMAN 25 % IV SOLN
INTRAVENOUS | Status: AC
  Administered 2017-01-23: 25 g via INTRAVENOUS
  Filled 2017-01-23: qty 100

## 2017-01-23 MED ORDER — LEVOFLOXACIN IN D5W 750 MG/150ML IV SOLN
750.0000 mg | Freq: Once | INTRAVENOUS | Status: AC
  Administered 2017-01-23: 750 mg via INTRAVENOUS
  Filled 2017-01-23: qty 150

## 2017-01-23 MED ORDER — VANCOMYCIN HCL IN DEXTROSE 750-5 MG/150ML-% IV SOLN
INTRAVENOUS | Status: AC
  Filled 2017-01-23: qty 150

## 2017-01-23 MED ORDER — DARBEPOETIN ALFA 60 MCG/0.3ML IJ SOSY
60.0000 ug | PREFILLED_SYRINGE | INTRAMUSCULAR | Status: DC
  Administered 2017-01-23 – 2017-01-30 (×2): 60 ug via INTRAVENOUS
  Filled 2017-01-23: qty 0.3

## 2017-01-23 MED ORDER — ALBUMIN HUMAN 25 % IV SOLN
25.0000 g | Freq: Once | INTRAVENOUS | Status: AC
  Administered 2017-01-23: 25 g via INTRAVENOUS
  Filled 2017-01-23: qty 100

## 2017-01-23 MED ORDER — DARBEPOETIN ALFA 60 MCG/0.3ML IJ SOSY
PREFILLED_SYRINGE | INTRAMUSCULAR | Status: AC
  Administered 2017-01-23: 60 ug via INTRAVENOUS
  Filled 2017-01-23: qty 0.3

## 2017-01-23 NOTE — Progress Notes (Signed)
PROGRESS NOTE  [name] James [id number] DOB: June 29, 1936 DOA: 01/19/2017 PCP: [name], MD  HPI/Recap of past 24 hours:  Pt has no new complaints, no acute issues reported to me by patient overnight.  Assessment/Plan: Principal Problem:   Sepsis ([place]) Active Problems:   Essential hypertension   Anemia   ESRD (end stage renal disease) (HCC)   Paroxysmal atrial fibrillation (HCC)   Controlled type 2 diabetes mellitus with diabetic polyneuropathy, with long-term current use of insulin (HCC)   HCAP (healthcare-associated pneumonia)   Pressure ulcer  1. Sepsis from pneumonia:  Source of infection left lower lobe pna and influenza A. Organism unknown.  Sputum Culture and urine pna antigen test need to be collected, I have informed RN at bedside.  Patient meets criteria given tachypnea, leukocytosis, and evidence of organ dysfunction.  Lactate 2.2 mmol/L and repeat ordered within 6 hours.  This patient is at high risk of poor outcomes with a qSOFA score of 2.  Antibiotics delivered in the ED.  she is admitted to stepdown,  -Sepsis bundle utilized:             -Blood and urine cultures drawn             -30 ml/kg bolus given in ED, will repeat lactic acid             -Antibiotics: vancomycin and cefepime             -Will defer atypical coverage because recent doxycycline/appropriate treatment             -Oseltamivir renal dosing, as patient tested positive for flu - Pt transitioned to levaquin  2. Hypertension: presented with hypotension/sepsis On metoprolol daily, midodrine for intradialytic hypotension -patient presented with hypotension, sbp in 80's initial on presentation, Hold metoprolol until hemodynamics clearer  3. ESRD on HD MWF:  -Continue midodrine with dialysis - nephrology consulted and assisting  4. Pressure ulcer, presented on admission -Stage 2 Pressure injury sacrum with non blanchable redness over the buttocks.  presented on admission.  Patient  is incontinent of stool, so the redness also can be related to MASD  Wound care input appreciated  5. Diet controlled Diabetes:  Not on antidiabetics.  6. pAF:  CHADS2Vasc 7.  Not on warfarin because of risk of bleeding. -Continue amiodarone -Continue full aspirin  7. Other medications:  -Continue allopurinol and PPI  8. Anemia: Stable at baseline  9. Chronic diastolic CHF: Currently, seems euvolemic. -Strict I/Os -no edema, volume managed by dialysis  10. Elevated LFTs: No abdominal pain or nausea.  Presumably from sepsis/influenza. -Trend LFTs , check ck, hepatitis panel, abdominal US.  11. Hypokalemia - replace and reassess   FTT: baseline bed to wheelchair bound, need lift for transfer.  DVT prophylaxis: Lovenox    Code Status: full  Family Communication: patient   Disposition Plan: transfer to floor.   Consultants:  nephrology  Procedures:  Dialysis MWF  Antibiotics:  Vanc/cefepime    Objective: BP (!) 141/72 (BP Location: Right Arm)   Pulse 79   Temp 98.4 F (36.9 C) (Oral)   Resp 18   Ht 5\' 3"  (1.6 m)   Wt 74.8 kg (164 lb 14.5 oz)   SpO2 100%   BMI 29.21 kg/m   Intake/Output Summary (Last 24 hours) at 01/23/17 1603 Last data filed at 01/23/17 8675  Gross per 24 hour  Intake              170 ml  Output                2 ml  Net              168 ml   Filed Weights   01/22/17 2220 01/23/17 0800 01/23/17 1219  Weight: 76 kg (167 lb 8.8 oz) 76 kg (167 lb 8.8 oz) 74.8 kg (164 lb 14.5 oz)    Exam:   General:  Frail, alert and awake, NAD  Cardiovascular: RRR, no rubs  Respiratory: +rhonchi, crackles left lower lobe  Abdomen: Soft/ND/NT, positive BS  Musculoskeletal: No Edema  Neuro: aaox3  Data Reviewed: Basic Metabolic Panel:  Recent Labs Lab 01/19/17 1725 01/20/17 0634 01/21/17 0436 01/22/17 0656 01/23/17 0732  NA 136 137 135 137 135  K 3.9 3.1* 3.1* 4.0 4.2  CL 95* 101 101 102 102  CO2 30 26 25 30 27     GLUCOSE 108* 81 91 67 91  BUN 7 11 17  5* 11  CREATININE 1.88* 2.18* 2.85* 1.65* 2.61*  CALCIUM 7.5* 6.7* 6.9* 7.1* 7.3*  PHOS  --   --   --  1.4* 1.8*   Liver Function Tests:  Recent Labs Lab 01/19/17 1725 01/20/17 0634 01/21/17 0436 01/23/17 0732  AST 272* 222* 196*  --   ALT 158* 134* 118*  --   ALKPHOS 149* 106 108  --   BILITOT 1.3* 1.1 1.0  --   PROT 6.7 5.3* 5.1*  --   ALBUMIN 1.9* 1.6* 1.4* 1.5*   No results for input(s): LIPASE, AMYLASE in the last 168 hours. No results for input(s): AMMONIA in the last 168 hours. CBC:  Recent Labs Lab 01/19/17 1725 01/20/17 0634 01/23/17 0732  WBC 12.5* 9.6 7.4  NEUTROABS 8.1*  --   --   HGB 11.2* 9.4* 9.2*  HCT 36.5 31.1* 30.3*  MCV 99.7 100.0 99.0  PLT 166 104* 119*   Cardiac Enzymes:    Recent Labs Lab 01/21/17 0436  CKTOTAL 982*   BNP (last 3 results)  Recent Labs  01/20/17 0634  BNP 451.4*    ProBNP (last 3 results) No results for input(s): PROBNP in the last 8760 hours.  CBG:  Recent Labs Lab 01/22/17 0803 01/22/17 1127 01/22/17 1638 01/22/17 2210 01/23/17 1335  GLUCAP 69 97 119* 118* 75    Recent Results (from the past 240 hour(s))  Culture, blood (Routine x 2)     Status: None (Preliminary result)   Collection Time: 01/19/17  5:22 PM  Result Value Ref Range Status   Specimen Description BLOOD RIGHT ANTECUBITAL  Final   Special Requests BOTTLES DRAWN AEROBIC AND ANAEROBIC 5CC  Final   Culture NO GROWTH 4 DAYS  Final   Report Status PENDING  Incomplete  MRSA PCR Screening     Status: None   Collection Time: 01/20/17 12:10 PM  Result Value Ref Range Status   MRSA by PCR NEGATIVE NEGATIVE Final    Comment:        The GeneXpert MRSA Assay (FDA approved for NASAL specimens only), is one component of a comprehensive MRSA colonization surveillance program. It is not intended to diagnose MRSA infection nor to guide or monitor treatment for MRSA infections.   Culture, sputum-assessment      Status: None   Collection Time: 01/20/17 10:31 PM  Result Value Ref Range Status   Specimen Description EXPECTORATED SPUTUM  Final   Special Requests NONE  Final   Sputum evaluation   Final    Sputum  specimen not acceptable for testing.  Please recollect.   Gram Stain Report Called to,Read Back By and Verified With: MILLER,RN @0132  01/21/17 MKELLY,MLT    Report Status 01/21/2017 FINAL  Final     Studies: No results found.  Scheduled Meds: . allopurinol  100 mg Oral Daily  . amiodarone  200 mg Oral Daily  . aspirin  325 mg Oral Daily  . atorvastatin  80 mg Oral q1800  . darbepoetin (ARANESP) injection - DIALYSIS  60 mcg Intravenous Q Fri-HD  . dextromethorphan-guaiFENesin  1 tablet Oral BID  . feeding supplement (PRO-STAT SUGAR FREE 64)  30 mL Oral BID  . heparin  5,000 Units Subcutaneous Q8H  . insulin aspart  0-5 Units Subcutaneous QHS  . insulin aspart  0-9 Units Subcutaneous TID WC  . levofloxacin (LEVAQUIN) IV  750 mg Intravenous Once   Followed by  . [START ON 01/25/2017] levofloxacin (LEVAQUIN) IV  500 mg Intravenous Q48H  . mouth rinse  15 mL Mouth Rinse BID  . megestrol  200 mg Oral Daily  . midodrine  5 mg Oral Q M,W,F-HD  . multivitamin  1 tablet Oral QHS  . oseltamivir  30 mg Oral Q M,W,F  . pantoprazole  40 mg Oral Daily  . potassium chloride  40 mEq Oral Once    Continuous Infusions:   Time spent: 47mins  Velvet Bathe MD, PhD  [facility] Pager [phone] If 7PM-7AM, please contact night-coverage at [email or web address], password [facility] 01/23/2017, 4:03 PM  LOS: 4 days

## 2017-01-23 NOTE — Clinical Social Work Note (Signed)
Patient is from [facility] and will return there at discharge. CSW continuing to follow and will facilitate discharge back to facility when medically stable.  Kirubel Aja Givens, MSW, LCSW Licensed Clinical Social Worker [place] [phone]

## 2017-01-23 NOTE — Progress Notes (Signed)
[facility] Progress Note   Subjective:  Seen on dialysis, 1.5L UF goal. Reports some CP last night, none today. Dyspnea improving slightly, but still present. On nasal oxygen.  Objective Vitals:   01/22/17 1100 01/22/17 1700 01/22/17 2220 01/23/17 0534  BP: (!) 118/39 (!) 121/45 (!) 125/48 (!) 148/57  Pulse: 68 68 70 75  Resp:  20 18 16   Temp: 97.7 F (36.5 C) 98.4 F (36.9 C) 98 F (36.7 C) 98.2 F (36.8 C)  TempSrc: Oral Oral Oral Oral  SpO2: 98% 98% 99% 100%  Weight:   76 kg (167 lb 8.8 oz)   Height:       Physical Exam General: Ill appearing female, NAD. On nasal oxygen. Heart: RRR; no murmur. Lungs: Rhonchi throughout all lung fields. Abdomen: Soft, non-tender Extremities: Trace LE edema. Dialysis Access: LUE AVF (cannulated)  Additional Objective Labs: Basic Metabolic Panel:  Recent Labs Lab 01/21/17 0436 01/22/17 0656 01/23/17 0732  NA 135 137 135  K 3.1* 4.0 4.2  CL 101 102 102  CO2 25 30 27   GLUCOSE 91 67 91  BUN 17 5* 11  CREATININE 2.85* 1.65* 2.61*  CALCIUM 6.9* 7.1* 7.3*  PHOS  --  1.4* 1.8*   Liver Function Tests:  Recent Labs Lab 01/19/17 1725 01/20/17 0634 01/21/17 0436 01/23/17 0732  AST 272* 222* 196*  --   ALT 158* 134* 118*  --   ALKPHOS 149* 106 108  --   BILITOT 1.3* 1.1 1.0  --   PROT 6.7 5.3* 5.1*  --   ALBUMIN 1.9* 1.6* 1.4* 1.5*   CBC:  Recent Labs Lab 01/19/17 1725 01/20/17 0634 01/23/17 0732  WBC 12.5* 9.6 7.4  NEUTROABS 8.1*  --   --   HGB 11.2* 9.4* 9.2*  HCT 36.5 31.1* 30.3*  MCV 99.7 100.0 99.0  PLT 166 104* 119*   Blood Culture    Component Value Date/Time   SDES EXPECTORATED SPUTUM 01/20/2017 2231   SPECREQUEST NONE 01/20/2017 2231   CULT NO GROWTH 3 DAYS 01/19/2017 1722   REPTSTATUS 01/21/2017 FINAL 01/20/2017 2231    Cardiac Enzymes:  Recent Labs Lab 01/21/17 0436  CKTOTAL 982*   CBG:  Recent Labs Lab 01/21/17 2143 01/22/17 0803 01/22/17 1127 01/22/17 1638  01/22/17 2210  GLUCAP 81 69 97 119* 118*   Studies/Results: US Abdomen Limited Ruq  Result Date: 01/21/2017 CLINICAL DATA:  Elevated liver function studies. Previous cholecystectomy. EXAM: US ABDOMEN LIMITED - RIGHT UPPER QUADRANT COMPARISON:  Abdominal and pelvic CT scan of January 22, 2016 FINDINGS: Gallbladder: The gallbladder is surgically absent. Common bile duct: Diameter: 4 mm Liver: The hepatic echotexture is mildly heterogeneous. There is no discrete mass or ductal dilation. The surface contour of the liver is normal. Incidental note is made of a right pleural effusion. There is also a right renal cyst in the upper pole laterally measuring 1.9 x 1.9 x 2.2 cm. The pancreatic duct is prominent measuring 4.4 mm in diameter. IMPRESSION: Previous cholecystectomy. Normal calibered common bile duct but mild prominence of the pancreatic duct of uncertain etiology. Heterogeneous hepatic echotexture without discrete mass or intrahepatic ductal dilation. If the elevated liver function studies remain increased, hepatic protocol MRI would be a useful next imaging step. Right pleural effusion. Electronically Signed   By: David  Martinique M.D.   On: 01/21/2017 09:10   Medications:  . allopurinol  100 mg Oral Daily  . amiodarone  200 mg Oral Daily  . aspirin  325 mg  Oral Daily  . atorvastatin  80 mg Oral q1800  . ceFEPime (MAXIPIME) IV  1 g Intravenous Q24H  . dextromethorphan-guaiFENesin  1 tablet Oral BID  . feeding supplement (PRO-STAT SUGAR FREE 64)  30 mL Oral BID  . heparin  5,000 Units Subcutaneous Q8H  . insulin aspart  0-5 Units Subcutaneous QHS  . insulin aspart  0-9 Units Subcutaneous TID WC  . lanthanum  1,000 mg Oral TID WC  . mouth rinse  15 mL Mouth Rinse BID  . megestrol  200 mg Oral Daily  . midodrine  5 mg Oral Q M,W,F-HD  . multivitamin  1 tablet Oral QHS  . oseltamivir  30 mg Oral Q M,W,F  . pantoprazole  40 mg Oral Daily  . potassium chloride  40 mEq Oral Once  . vancomycin   750 mg Intravenous Q M,W,F-HD    Dialysis Orders: MWF AF 4 hr 72.5 - leaving ~72 very low gains 2 K 2.25 Ca Heparin 3300 left AVGG no calcitriol or Fe, ESA Recent labs: hgb 11 22% sat ferrtin 2610 iPTH 180 alb 2.2 P 3 Access Hx - PTA 80 - 90% outflo ax vein stenosis and 70% venous stenosis 12/19/17- post procedure AF 600s which dropped to 300s twice in January (this was her pre procedure AF range- kinetics however great)  Assessment/Plan: 1. Sepsis/PNA/+ Influenza A: On Tamiflu and Vanc/Cefepime. BCx 1/29 negative. 2. ESRD: Continue MWF schedule. No heparin d/t bleeding with last HD. 3. BP/volume: BP chronically low, on midodrine. Metoprolol on hold. 4. Anemia: Hgb 9.2, trending down. Start Aranesp 60mg  weekly. 5. Metabolic bone disease: Phos low, hold binder. No VDRA. 6. Severe protein malnutrition: Alb 1.5. Continue to liberalize diet to regular due to poor intake (she is on this at [facility]) supplements cause diarrhea by report; added prostat/vitamins; encouraged her to at least sip liquids. Will try Megace daily as well. Very poor appetite. 7. Paroxysmal AF: On amiodarone/ASA. 8. Increased LFTs: Per primary. Hepatitis panel negative. Improving.  [name], PA-C 01/23/2017, 8:53 AM  [facility] Pager: [phone]  Seen on dialysis at 940am 4/2.25 bath Qb/qd 400/800 with 2L UF tolerating VSS w/ no complaints but still very weak; denies dyspnea or prod cough or myalgias.  Agree with PA A/P  Otelia Santee, MD [facility]

## 2017-01-23 NOTE — Progress Notes (Signed)
Pharmacy Antibiotic Note  [name] James is a 81 y.o. female admitted on 01/19/2017 with pneumonia.  Pharmacy has been consulted for levaquin dosing.  Plan: Levaquin 750mg  IV once followed by 500mg  q48h Monitor culture data, HD plans and clinical course  Height: 5\' 3"  (160 cm) Weight: 167 lb 8.8 oz (76 kg) IBW/kg (Calculated) : 52.4  Temp (24hrs), Avg:98.1 F (36.7 C), Min:97.7 F (36.5 C), Max:98.4 F (36.9 C)   Recent Labs Lab 01/19/17 1725 01/19/17 1744 01/19/17 2127 01/20/17 0634 01/21/17 0436 01/22/17 0656 01/23/17 0732  WBC 12.5*  --   --  9.6  --   --  7.4  CREATININE 1.88*  --   --  2.18* 2.85* 1.65* 2.61*  LATICACIDVEN  --  1.83 2.27*  --  0.9  --   --     Estimated Creatinine Clearance: 17.1 mL/min (by C-G formula based on SCr of 2.61 mg/dL (H)).    Allergies  Allergen Reactions  . Erythromycin Swelling  . Erythromycin Base     Other reaction(s): GI Upset (intolerance)  . Penicillins Other (See Comments)    Unknown allergic reaction.  She has taken keflex and Rocephin many times without problems  . Codeine Rash and Palpitations    Other reaction(s): GI Upset (intolerance)  . Penicillin G Rash     Andrey Cota. Diona Foley, PharmD, [place] Clinical Pharmacist Pager [phone] 01/23/2017 10:37 AM

## 2017-01-24 LAB — CULTURE, BLOOD (ROUTINE X 2): CULTURE: NO GROWTH

## 2017-01-24 LAB — EXPECTORATED SPUTUM ASSESSMENT W REFEX TO RESP CULTURE

## 2017-01-24 LAB — GLUCOSE, CAPILLARY
GLUCOSE-CAPILLARY: 134 mg/dL — AB (ref 65–99)
Glucose-Capillary: 85 mg/dL (ref 65–99)
Glucose-Capillary: 127 mg/dL — ABNORMAL HIGH (ref 65–99)
Glucose-Capillary: 115 mg/dL — ABNORMAL HIGH (ref 65–99)

## 2017-01-24 LAB — EXPECTORATED SPUTUM ASSESSMENT W GRAM STAIN, RFLX TO RESP C

## 2017-01-24 NOTE — Progress Notes (Signed)
PROGRESS NOTE  [name] James:[id number] DOB: [birth year] DOA: 01/19/2017 PCP: [name], MD  HPI/Recap of past 24 hours:  Pt has no new complaints, no acute issues reported to me by patient overnight.  Assessment/Plan: Principal Problem:   Sepsis ([place]) Active Problems:   Essential hypertension   Anemia   ESRD (end stage renal disease) (HCC)   Paroxysmal atrial fibrillation (HCC)   Controlled type 2 diabetes mellitus with diabetic polyneuropathy, with long-term current use of insulin (HCC)   HCAP (healthcare-associated pneumonia)   Pressure ulcer  1. Sepsis from pneumonia:  Source of infection left lower lobe pna and influenza A. Organism unknown.  Sputum Culture and urine pna antigen test need to be collected, I have informed RN at bedside.  Patient meets criteria given tachypnea, leukocytosis, and evidence of organ dysfunction.  Lactate 2.2 mmol/L and repeat ordered within 6 hours.  This patient is at high risk of poor outcomes with a qSOFA score of 2.  Antibiotics delivered in the ED.  she is admitted to stepdown,  -Sepsis bundle utilized:             -Blood and urine cultures drawn             -30 ml/kg bolus given in ED, will repeat lactic acid             -Antibiotics: vancomycin and cefepime             -Will defer atypical coverage because recent doxycycline/appropriate treatment             -Oseltamivir renal dosing, as patient tested positive for flu - Pt transitioned to levaquin  2. Hypertension: presented with hypotension/sepsis On metoprolol daily, midodrine for intradialytic hypotension -patient presented with hypotension, sbp in 80's initial on presentation,  - will continue restarting b blocker pending blood pressures. Patient has had some low readings.  3. ESRD on HD MWF:  -Continue midodrine with dialysis - nephrology consulted and assisting  4. Pressure ulcer, presented on admission -Stage 2 Pressure injury sacrum with non blanchable  redness over the buttocks.  presented on admission.  Patient is incontinent of stool, so the redness also can be related to MASD  Wound care input appreciated  5. Diabetes/hyperglycemia:  - currently on ssi  6. pAF:  CHADS2Vasc 7.  Not on warfarin because of risk of bleeding. -Continue amiodarone -Continue full aspirin  7. Other medications:  -Continue allopurinol and PPI  8. Anemia: Stable at baseline  9. Chronic diastolic CHF: Currently, seems euvolemic. -Strict I/Os -no edema, volume managed by dialysis  10. Elevated LFTs: No abdominal pain or nausea.  Presumably from sepsis/influenza. -Trend LFTs , u/s reviewed. - will d/c statin - reassess cmp next am.  11. Hypokalemia - resolved   FTT: baseline bed to wheelchair bound, need lift for transfer.  DVT prophylaxis: Lovenox    Code Status: full  Family Communication: patient   Disposition Plan: transfer to floor.   Consultants:  nephrology  Procedures:  Dialysis MWF  Antibiotics:  Vanc/cefepime    Objective: BP (!) 169/60 (BP Location: Right Arm)   Pulse 93   Temp 98 F (36.7 C) (Oral)   Resp 18   Ht 5\' 3"  (1.6 m)   Wt 78.7 kg (173 lb 8 oz)   SpO2 100%   BMI 30.73 kg/m   Intake/Output Summary (Last 24 hours) at 01/24/17 1602 Last data filed at 01/24/17 0854  Gross per 24 hour  Intake  510 ml  Output                1 ml  Net              509 ml   Filed Weights   01/23/17 0800 01/23/17 1219 01/23/17 2058  Weight: 76 kg (167 lb 8.8 oz) 74.8 kg (164 lb 14.5 oz) 78.7 kg (173 lb 8 oz)    Exam:   General:  Frail, alert and awake, NAD  Cardiovascular: RRR, no rubs  Respiratory: +rhonchi, crackles left lower lobe  Abdomen: Soft/ND/NT, positive BS  Musculoskeletal: No Edema  Neuro: aaox3  Data Reviewed: Basic Metabolic Panel:  Recent Labs Lab 01/19/17 1725 01/20/17 0634 01/21/17 0436 01/22/17 0656 01/23/17 0732  NA 136 137 135 137 135  K 3.9 3.1*  3.1* 4.0 4.2  CL 95* 101 101 102 102  CO2 30 26 25 30 27   GLUCOSE 108* 81 91 67 91  BUN 7 11 17  5* 11  CREATININE 1.88* 2.18* 2.85* 1.65* 2.61*  CALCIUM 7.5* 6.7* 6.9* 7.1* 7.3*  PHOS  --   --   --  1.4* 1.8*   Liver Function Tests:  Recent Labs Lab 01/19/17 1725 01/20/17 0634 01/21/17 0436 01/23/17 0732  AST 272* 222* 196*  --   ALT 158* 134* 118*  --   ALKPHOS 149* 106 108  --   BILITOT 1.3* 1.1 1.0  --   PROT 6.7 5.3* 5.1*  --   ALBUMIN 1.9* 1.6* 1.4* 1.5*   No results for input(s): LIPASE, AMYLASE in the last 168 hours. No results for input(s): AMMONIA in the last 168 hours. CBC:  Recent Labs Lab 01/19/17 1725 01/20/17 0634 01/23/17 0732  WBC 12.5* 9.6 7.4  NEUTROABS 8.1*  --   --   HGB 11.2* 9.4* 9.2*  HCT 36.5 31.1* 30.3*  MCV 99.7 100.0 99.0  PLT 166 104* 119*   Cardiac Enzymes:    Recent Labs Lab 01/21/17 0436  CKTOTAL 982*   BNP (last 3 results)  Recent Labs  01/20/17 0634  BNP 451.4*    ProBNP (last 3 results) No results for input(s): PROBNP in the last 8760 hours.  CBG:  Recent Labs Lab 01/23/17 1335 01/23/17 1636 01/23/17 2057 01/24/17 0812 01/24/17 1206  GLUCAP 75 101* 110* 85 127*    Recent Results (from the past 240 hour(s))  Culture, blood (Routine x 2)     Status: None   Collection Time: 01/19/17  5:22 PM  Result Value Ref Range Status   Specimen Description BLOOD RIGHT ANTECUBITAL  Final   Special Requests BOTTLES DRAWN AEROBIC AND ANAEROBIC 5CC  Final   Culture NO GROWTH 5 DAYS  Final   Report Status 01/24/2017 FINAL  Final  MRSA PCR Screening     Status: None   Collection Time: 01/20/17 12:10 PM  Result Value Ref Range Status   MRSA by PCR NEGATIVE NEGATIVE Final    Comment:        The GeneXpert MRSA Assay (FDA approved for NASAL specimens only), is one component of a comprehensive MRSA colonization surveillance program. It is not intended to diagnose MRSA infection nor to guide or monitor treatment  for MRSA infections.   Culture, sputum-assessment     Status: None   Collection Time: 01/20/17 10:31 PM  Result Value Ref Range Status   Specimen Description EXPECTORATED SPUTUM  Final   Special Requests NONE  Final   Sputum evaluation   Final  Sputum specimen not acceptable for testing.  Please recollect.   Gram Stain Report Called to,Read Back By and Verified With: MILLER,RN @0132  01/21/17 MKELLY,MLT    Report Status 01/21/2017 FINAL  Final     Studies: No results found.  Scheduled Meds: . allopurinol  100 mg Oral Daily  . amiodarone  200 mg Oral Daily  . aspirin  325 mg Oral Daily  . darbepoetin (ARANESP) injection - DIALYSIS  60 mcg Intravenous Q Fri-HD  . dextromethorphan-guaiFENesin  1 tablet Oral BID  . feeding supplement (PRO-STAT SUGAR FREE 64)  30 mL Oral BID  . heparin  5,000 Units Subcutaneous Q8H  . insulin aspart  0-5 Units Subcutaneous QHS  . insulin aspart  0-9 Units Subcutaneous TID WC  . [START ON 01/25/2017] levofloxacin (LEVAQUIN) IV  500 mg Intravenous Q48H  . mouth rinse  15 mL Mouth Rinse BID  . megestrol  200 mg Oral Daily  . midodrine  5 mg Oral Q M,W,F-HD  . multivitamin  1 tablet Oral QHS  . oseltamivir  30 mg Oral Q M,W,F  . pantoprazole  40 mg Oral Daily    Continuous Infusions:   Time spent: 102mins  Velvet Bathe MD, PhD  [facility] Pager [phone] If 7PM-7AM, please contact night-coverage at [email or web address], password [facility] 01/24/2017, 4:02 PM  LOS: 5 days

## 2017-01-24 NOTE — Progress Notes (Signed)
Pt wheezing, rhonchi, gave breathing tx albuterol.  Coughed up moderate amount thick whitish mucous sputum.

## 2017-01-24 NOTE — Progress Notes (Signed)
[facility] Progress Note   Subjective:  Seen in room. Says she had a rough night with coughing, did not get much sleep. Feels weak today. Denies CP. Still with dyspnea requiring nasal oxygen.  Objective Vitals:   01/23/17 1801 01/23/17 2058 01/24/17 0602 01/24/17 0854  BP: 135/77 (!) 118/48 (!) 136/48 (!) 169/60  Pulse: 80 79 88 93  Resp: 18 17 16 18   Temp: 98.9 F (37.2 C) 98.4 F (36.9 C) 97.9 F (36.6 C) 98 F (36.7 C)  TempSrc: Oral Oral Oral Oral  SpO2: 100% 100% 100% 100%  Weight:  78.7 kg (173 lb 8 oz)    Height:       Physical Exam General: Ill appearing female, NAD. On nasal oxygen. Heart: RRR; no murmur. Lungs: Rhonchi throughout all lung fields. Abdomen: Soft, non-tender Extremities: No LE edema. Dialysis Access: LUE AVG + thrill/bruit  Additional Objective Labs: Basic Metabolic Panel:  Recent Labs Lab 01/21/17 0436 01/22/17 0656 01/23/17 0732  NA 135 137 135  K 3.1* 4.0 4.2  CL 101 102 102  CO2 25 30 27   GLUCOSE 91 67 91  BUN 17 5* 11  CREATININE 2.85* 1.65* 2.61*  CALCIUM 6.9* 7.1* 7.3*  PHOS  --  1.4* 1.8*   Liver Function Tests:  Recent Labs Lab 01/19/17 1725 01/20/17 0634 01/21/17 0436 01/23/17 0732  AST 272* 222* 196*  --   ALT 158* 134* 118*  --   ALKPHOS 149* 106 108  --   BILITOT 1.3* 1.1 1.0  --   PROT 6.7 5.3* 5.1*  --   ALBUMIN 1.9* 1.6* 1.4* 1.5*   CBC:  Recent Labs Lab 01/19/17 1725 01/20/17 0634 01/23/17 0732  WBC 12.5* 9.6 7.4  NEUTROABS 8.1*  --   --   HGB 11.2* 9.4* 9.2*  HCT 36.5 31.1* 30.3*  MCV 99.7 100.0 99.0  PLT 166 104* 119*   CBG:  Recent Labs Lab 01/22/17 2210 01/23/17 1335 01/23/17 1636 01/23/17 2057 01/24/17 0812  GLUCAP 118* 75 101* 110* 85   Medications:  . allopurinol  100 mg Oral Daily  . amiodarone  200 mg Oral Daily  . aspirin  325 mg Oral Daily  . atorvastatin  80 mg Oral q1800  . darbepoetin (ARANESP) injection - DIALYSIS  60 mcg Intravenous Q Fri-HD  .  dextromethorphan-guaiFENesin  1 tablet Oral BID  . feeding supplement (PRO-STAT SUGAR FREE 64)  30 mL Oral BID  . heparin  5,000 Units Subcutaneous Q8H  . insulin aspart  0-5 Units Subcutaneous QHS  . insulin aspart  0-9 Units Subcutaneous TID WC  . [START ON 01/25/2017] levofloxacin (LEVAQUIN) IV  500 mg Intravenous Q48H  . mouth rinse  15 mL Mouth Rinse BID  . megestrol  200 mg Oral Daily  . midodrine  5 mg Oral Q M,W,F-HD  . multivitamin  1 tablet Oral QHS  . oseltamivir  30 mg Oral Q M,W,F  . pantoprazole  40 mg Oral Daily    Dialysis Orders: MWF AF 4 hr 72.5 - leaving ~72 very low gains 2 K 2.25 Ca Heparin 3300 left AVGG no calcitriol or Fe, ESA Recent labs: hgb 11 22% sat ferrtin 2610 iPTH 180 alb 2.2 P 3 Access Hx - PTA 80 - 90% outflo ax vein stenosis and 70% venous stenosis 12/19/17- post procedure AF 600s which dropped to 300s twice in January (this was her pre procedure AF range- kinetics however great)  Assessment/Plan: 1. Sepsis/PNA/+ Influenza A: On Tamiflu.  Vanc/Cefepime narrowed to Levaquin. Lungs still sound terrible, getting albuterol neb prn. BCx 1/29 negative. 2. ESRD: Continue MWF schedule, next 2/5. No heparin d/t bleeding with last HD. 3. BP/volume: BP chronically low, but higher today. Weight increasing. On midodrine. Metoprolol on hold. 4. Anemia: Hgb 9.2, trending down. Aranesp 43mcg weekly started 2/2. 5. Metabolic bone disease: Phos low, binder on hold. No VDRA. 6. Severe protein malnutrition: Alb 1.5. Continue to liberalize diet to regular due to poor intake (she is on this at [facility]) supplements cause diarrhea by report; added prostat/vitamins; encouraged her to at least sip liquids. Will try Megace daily as well. Very poor appetite. 7. Paroxysmal AF: On amiodarone/ASA. 8. Increased LFTs: Per primary. Hepatitis panel negative. Improving.  [name], PA-C 01/24/2017, 9:15 AM  [employer] Pager: [phone]  Patient seen and  examined. Agree with PA A/P She looks absolutely pitiful and tries to be pleasant which is no easy task. Still with a cough, mild dyspnea, weakness and forcing herself to eat her meals. The breathing treatments are helping.  Next HD is Mon; no acute indication.  Otelia Santee, MD [facility]

## 2017-01-25 ENCOUNTER — Inpatient Hospital Stay (HOSPITAL_COMMUNITY): Payer: Medicare Other

## 2017-01-25 DIAGNOSIS — I10 Essential (primary) hypertension: Secondary | ICD-10-CM

## 2017-01-25 LAB — COMPREHENSIVE METABOLIC PANEL
ALBUMIN: 1.6 g/dL — AB (ref 3.5–5.0)
ALK PHOS: 90 U/L (ref 38–126)
ALT: 98 U/L — AB (ref 14–54)
AST: 139 U/L — ABNORMAL HIGH (ref 15–41)
Anion gap: 5 (ref 5–15)
BUN: 16 mg/dL (ref 6–20)
CALCIUM: 7.4 mg/dL — AB (ref 8.9–10.3)
CO2: 28 mmol/L (ref 22–32)
CREATININE: 2.58 mg/dL — AB (ref 0.44–1.00)
Chloride: 101 mmol/L (ref 101–111)
GFR calc Af Amer: 19 mL/min — ABNORMAL LOW (ref >60)
GFR calc non Af Amer: 17 mL/min — ABNORMAL LOW (ref >60)
GLUCOSE: 128 mg/dL — AB (ref 65–99)
Potassium: 3.5 mmol/L (ref 3.5–5.1)
SODIUM: 134 mmol/L — AB (ref 135–145)
Total Bilirubin: 0.8 mg/dL (ref 0.3–1.2)
Total Protein: 5.1 g/dL — ABNORMAL LOW (ref 6.5–8.1)

## 2017-01-25 LAB — GLUCOSE, CAPILLARY
GLUCOSE-CAPILLARY: 120 mg/dL — AB (ref 65–99)
Glucose-Capillary: 100 mg/dL — ABNORMAL HIGH (ref 65–99)
Glucose-Capillary: 143 mg/dL — ABNORMAL HIGH (ref 65–99)
Glucose-Capillary: 126 mg/dL — ABNORMAL HIGH (ref 65–99)

## 2017-01-25 LAB — BRAIN NATRIURETIC PEPTIDE: B Natriuretic Peptide: 214.8 pg/mL — ABNORMAL HIGH (ref 0.0–100.0)

## 2017-01-25 MED ORDER — MIDODRINE HCL 5 MG PO TABS
10.0000 mg | ORAL_TABLET | ORAL | Status: DC
  Administered 2017-01-26 – 2017-02-02 (×5): 10 mg via ORAL
  Filled 2017-01-25 (×3): qty 2

## 2017-01-25 MED ORDER — ALBUTEROL SULFATE (2.5 MG/3ML) 0.083% IN NEBU
2.5000 mg | INHALATION_SOLUTION | Freq: Four times a day (QID) | RESPIRATORY_TRACT | Status: AC
  Administered 2017-01-25 – 2017-01-27 (×7): 2.5 mg via RESPIRATORY_TRACT
  Filled 2017-01-25 (×7): qty 3

## 2017-01-25 NOTE — Progress Notes (Signed)
MD (vega) made aware of chest xray results. MD advises this RN to contact nephrology for further orders based on results.

## 2017-01-25 NOTE — Significant Event (Signed)
Rapid Response Event Note  Overview:Follow up on previous RRT call Time Called:  (followup on previous call @ 1615) Arrival Time: 1700 Event Type: Respiratory  Initial Focused Assessment:   Interventions: PCXR showed some chf and B pleural effusions.  Bedside RN notified Dr. Wendee Beavers and is awaiting phone call from renal doctor.  Plan of Care (if not transferred): Monitor pt. Place patient on continuous sat monitor.  Await renal doctors phone call. Call RRT if needed. Event Summary: Name of Physician Notified: Dr Wendee Beavers (notified by bedside RN) at 1831  Name of Consulting Physician Notified: [facility] MD at [place] (bedside RN awaiting call back)  Outcome: Stayed in room and stabalized  Event End Time: 1920  [name] James

## 2017-01-25 NOTE — Progress Notes (Signed)
PROGRESS NOTE  [name] James:[id number] DOB: 1936-02-24 DOA: 01/19/2017 PCP: [name], MD  HPI/Recap of past 24 hours:  Pt has no new complaints, no acute issues reported to me by patient overnight.  Assessment/Plan: Principal Problem:   Sepsis ([place]) Active Problems:   Essential hypertension   Anemia   ESRD (end stage renal disease) (HCC)   Paroxysmal atrial fibrillation (HCC)   Controlled type 2 diabetes mellitus with diabetic polyneuropathy, with long-term current use of insulin (HCC)   HCAP (healthcare-associated pneumonia)   Pressure ulcer  1. Sepsis from pneumonia:  Source of infection left lower lobe pna and influenza A. Organism unknown.  Sputum Culture and urine pna antigen test need to be collected, I have informed RN at bedside.  Patient meets criteria given tachypnea, leukocytosis, and evidence of organ dysfunction.  Lactate 2.2 mmol/L and repeat ordered within 6 hours.  This patient is at high risk of poor outcomes with a qSOFA score of 2.  Antibiotics delivered in the ED.  she is admitted to stepdown,  -Sepsis bundle utilized:             -Blood and urine cultures drawn             -30 ml/kg bolus given in ED, will repeat lactic acid             -Antibiotics: vancomycin and cefepime             -Will defer atypical coverage because recent doxycycline/appropriate treatment             -Oseltamivir renal dosing, as patient tested positive for flu - Pt transitioned to levaquin will treat for 7 days today is day 7/7  2. Hypertension: presented with hypotension/sepsis On metoprolol daily, midodrine for intradialytic hypotension -patient presented with hypotension, sbp in 80's initial on presentation,  - will continue restarting b blocker pending blood pressures. Patient has had some low readings.  3. ESRD on HD MWF:  -Continue midodrine with dialysis - nephrology consulted and assisting  4. Pressure ulcer, presented on admission -Stage 2 Pressure  injury sacrum with non blanchable redness over the buttocks.  presented on admission.  Patient is incontinent of stool, so the redness also can be related to MASD  Wound care input appreciated  5. Diabetes/hyperglycemia:  - currently on ssi  6. pAF:  CHADS2Vasc 7.  Not on warfarin because of risk of bleeding. -Continue amiodarone -Continue full aspirin  7. Other medications:  -Continue allopurinol and PPI  8. Anemia: Stable at baseline  9. Chronic diastolic CHF: Currently, seems euvolemic. -Strict I/Os -no edema, volume managed by dialysis  10. Elevated LFTs: No abdominal pain or nausea.  Presumably from sepsis/influenza. -Trend LFTs , u/s reviewed. - will d/c statin - reassess cmp next am.  11. Hypokalemia - resolved   FTT: baseline bed to wheelchair bound, need lift for transfer.  DVT prophylaxis: Lovenox    Code Status: full  Family Communication: patient   Disposition Plan: d/c next am   Consultants:  nephrology  Procedures:  Dialysis MWF  Antibiotics:  Vanc/cefepime    Objective: BP (!) 113/46 (BP Location: Right Arm)   Pulse 83   Temp 98.6 F (37 C) (Oral)   Resp 18   Ht 5\' 3"  (1.6 m)   Wt 72 kg (158 lb 12.8 oz)   SpO2 98%   BMI 28.13 kg/m   Intake/Output Summary (Last 24 hours) at 01/25/17 1213 Last data filed at 01/25/17 1105  Gross per 24 hour  Intake              720 ml  Output                0 ml  Net              720 ml   Filed Weights   01/23/17 1219 01/23/17 2058 01/24/17 2123  Weight: 74.8 kg (164 lb 14.5 oz) 78.7 kg (173 lb 8 oz) 72 kg (158 lb 12.8 oz)    Exam:   General:  Frail, alert and awake, NAD  Cardiovascular: RRR, no rubs  Respiratory: +rhonchi, crackles left lower lobe  Abdomen: Soft/ND/NT, positive BS  Musculoskeletal: No Edema  Neuro: aaox3  Data Reviewed: Basic Metabolic Panel:  Recent Labs Lab 01/20/17 0634 01/21/17 0436 01/22/17 0656 01/23/17 0732 01/25/17 0446  NA 137 135  137 135 134*  K 3.1* 3.1* 4.0 4.2 3.5  CL 101 101 102 102 101  CO2 26 25 30 27 28   GLUCOSE 81 91 67 91 128*  BUN 11 17 5* 11 16  CREATININE 2.18* 2.85* 1.65* 2.61* 2.58*  CALCIUM 6.7* 6.9* 7.1* 7.3* 7.4*  PHOS  --   --  1.4* 1.8*  --    Liver Function Tests:  Recent Labs Lab 01/19/17 1725 01/20/17 0634 01/21/17 0436 01/23/17 0732 01/25/17 0446  AST 272* 222* 196*  --  139*  ALT 158* 134* 118*  --  98*  ALKPHOS 149* 106 108  --  90  BILITOT 1.3* 1.1 1.0  --  0.8  PROT 6.7 5.3* 5.1*  --  5.1*  ALBUMIN 1.9* 1.6* 1.4* 1.5* 1.6*   No results for input(s): LIPASE, AMYLASE in the last 168 hours. No results for input(s): AMMONIA in the last 168 hours. CBC:  Recent Labs Lab 01/19/17 1725 01/20/17 0634 01/23/17 0732  WBC 12.5* 9.6 7.4  NEUTROABS 8.1*  --   --   HGB 11.2* 9.4* 9.2*  HCT 36.5 31.1* 30.3*  MCV 99.7 100.0 99.0  PLT 166 104* 119*   Cardiac Enzymes:    Recent Labs Lab 01/21/17 0436  CKTOTAL 982*   BNP (last 3 results)  Recent Labs  01/20/17 0634  BNP 451.4*    ProBNP (last 3 results) No results for input(s): PROBNP in the last 8760 hours.  CBG:  Recent Labs Lab 01/24/17 1206 01/24/17 1729 01/24/17 2133 01/25/17 0801 01/25/17 1204  GLUCAP 127* 134* 115* 100* 143*    Recent Results (from the past 240 hour(s))  Culture, blood (Routine x 2)     Status: None   Collection Time: 01/19/17  5:22 PM  Result Value Ref Range Status   Specimen Description BLOOD RIGHT ANTECUBITAL  Final   Special Requests BOTTLES DRAWN AEROBIC AND ANAEROBIC 5CC  Final   Culture NO GROWTH 5 DAYS  Final   Report Status 01/24/2017 FINAL  Final  MRSA PCR Screening     Status: None   Collection Time: 01/20/17 12:10 PM  Result Value Ref Range Status   MRSA by PCR NEGATIVE NEGATIVE Final    Comment:        The GeneXpert MRSA Assay (FDA approved for NASAL specimens only), is one component of a comprehensive MRSA colonization surveillance program. It is not intended  to diagnose MRSA infection nor to guide or monitor treatment for MRSA infections.   Culture, sputum-assessment     Status: None   Collection Time: 01/20/17 10:31 PM  Result Value Ref  Range Status   Specimen Description EXPECTORATED SPUTUM  Final   Special Requests NONE  Final   Sputum evaluation   Final    Sputum specimen not acceptable for testing.  Please recollect.   Gram Stain Report Called to,Read Back By and Verified With: MILLER,RN @0132  01/21/17 MKELLY,MLT    Report Status 01/21/2017 FINAL  Final  Culture, expectorated sputum-assessment     Status: None   Collection Time: 01/24/17  2:23 PM  Result Value Ref Range Status   Specimen Description EXPECTORATED SPUTUM  Final   Special Requests NONE  Final   Sputum evaluation THIS SPECIMEN IS ACCEPTABLE FOR SPUTUM CULTURE  Final   Report Status 01/24/2017 FINAL  Final  Culture, respiratory (NON-Expectorated)     Status: None (Preliminary result)   Collection Time: 01/24/17  2:23 PM  Result Value Ref Range Status   Specimen Description EXPECTORATED SPUTUM  Final   Special Requests NONE Reflexed from [id number]  Final   Gram Stain   Final    ABUNDANT WBC PRESENT, PREDOMINANTLY PMN MODERATE GRAM POSITIVE COCCI IN PAIRS MODERATE GRAM POSITIVE COCCI IN CHAINS FEW GRAM NEGATIVE RODS FEW YEAST    Culture CULTURE REINCUBATED FOR BETTER GROWTH  Final   Report Status PENDING  Incomplete     Studies: No results found.  Scheduled Meds: . albuterol  2.5 mg Nebulization Q6H  . allopurinol  100 mg Oral Daily  . amiodarone  200 mg Oral Daily  . aspirin  325 mg Oral Daily  . darbepoetin (ARANESP) injection - DIALYSIS  60 mcg Intravenous Q Fri-HD  . dextromethorphan-guaiFENesin  1 tablet Oral BID  . feeding supplement (PRO-STAT SUGAR FREE 64)  30 mL Oral BID  . heparin  5,000 Units Subcutaneous Q8H  . insulin aspart  0-5 Units Subcutaneous QHS  . insulin aspart  0-9 Units Subcutaneous TID WC  . levofloxacin (LEVAQUIN) IV  500 mg  Intravenous Q48H  . mouth rinse  15 mL Mouth Rinse BID  . megestrol  200 mg Oral Daily  . midodrine  5 mg Oral Q M,W,F-HD  . multivitamin  1 tablet Oral QHS  . oseltamivir  30 mg Oral Q M,W,F  . pantoprazole  40 mg Oral Daily    Continuous Infusions:   Time spent: 54mins  Velvet Bathe MD, PhD  [facility] Pager [phone] If 7PM-7AM, please contact night-coverage at [email or web address], password [facility] 01/25/2017, 12:13 PM  LOS: 6 days

## 2017-01-25 NOTE — Progress Notes (Signed)
MD (vega) made aware that patient is complaining of feeling weak and tired. Also made MD aware that respiratory therapist Jeneen Rinks) stated that he was going to call rapid response for a "second pair of eyes".  Will continue to monitor. Patient is stable at this time. No new orders noted.

## 2017-01-25 NOTE — Progress Notes (Signed)
Paged [facility] at approximately [phone].  Awaiting return call regarding xray results.

## 2017-01-25 NOTE — Progress Notes (Signed)
[facility] Progress Note   Subjective:  Resting quietly in bed. Denies CP, still coughing quite a bit. + fatigue.   Objective Vitals:   01/24/17 0854 01/24/17 1726 01/24/17 2123 01/25/17 0515  BP: (!) 169/60 (!) 112/50 (!) 115/36 (!) 136/55  Pulse: 93 84 83 84  Resp: 18 18 18 18   Temp: 98 F (36.7 C) 98.2 F (36.8 C) 98.5 F (36.9 C) 98.5 F (36.9 C)  TempSrc: Oral Oral Oral Axillary  SpO2: 100% 100% 100% 100%  Weight:   72 kg (158 lb 12.8 oz)   Height:       Physical Exam General: Ill appearing female, NAD. On nasal oxygen. Heart: RRR; no murmur. Lungs: Rhonchi throughout all lung fields. Abdomen: Soft, non-tender Extremities: No LE edema. Dialysis Access: LUE AVG + thrill/bruit  Additional Objective Labs: Basic Metabolic Panel:  Recent Labs Lab 01/22/17 0656 01/23/17 0732 01/25/17 0446  NA 137 135 134*  K 4.0 4.2 3.5  CL 102 102 101  CO2 30 27 28   GLUCOSE 67 91 128*  BUN 5* 11 16  CREATININE 1.65* 2.61* 2.58*  CALCIUM 7.1* 7.3* 7.4*  PHOS 1.4* 1.8*  --    Liver Function Tests:  Recent Labs Lab 01/20/17 0634 01/21/17 0436 01/23/17 0732 01/25/17 0446  AST 222* 196*  --  139*  ALT 134* 118*  --  98*  ALKPHOS 106 108  --  90  BILITOT 1.1 1.0  --  0.8  PROT 5.3* 5.1*  --  5.1*  ALBUMIN 1.6* 1.4* 1.5* 1.6*   CBC:  Recent Labs Lab 01/19/17 1725 01/20/17 0634 01/23/17 0732  WBC 12.5* 9.6 7.4  NEUTROABS 8.1*  --   --   HGB 11.2* 9.4* 9.2*  HCT 36.5 31.1* 30.3*  MCV 99.7 100.0 99.0  PLT 166 104* 119*   Blood Culture    Component Value Date/Time   SDES EXPECTORATED SPUTUM 01/24/2017 1423   SDES EXPECTORATED SPUTUM 01/24/2017 1423   SPECREQUEST NONE 01/24/2017 1423   SPECREQUEST NONE Reflexed from [id number] 01/24/2017 1423   CULT PENDING 01/24/2017 1423   REPTSTATUS 01/24/2017 FINAL 01/24/2017 1423   REPTSTATUS PENDING 01/24/2017 1423    Cardiac Enzymes:  Recent Labs Lab 01/21/17 0436  CKTOTAL 982*   CBG:  Recent  Labs Lab 01/24/17 0812 01/24/17 1206 01/24/17 1729 01/24/17 2133 01/25/17 0801  GLUCAP 85 127* 134* 115* 100*   Medications:  . allopurinol  100 mg Oral Daily  . amiodarone  200 mg Oral Daily  . aspirin  325 mg Oral Daily  . darbepoetin (ARANESP) injection - DIALYSIS  60 mcg Intravenous Q Fri-HD  . dextromethorphan-guaiFENesin  1 tablet Oral BID  . feeding supplement (PRO-STAT SUGAR FREE 64)  30 mL Oral BID  . heparin  5,000 Units Subcutaneous Q8H  . insulin aspart  0-5 Units Subcutaneous QHS  . insulin aspart  0-9 Units Subcutaneous TID WC  . levofloxacin (LEVAQUIN) IV  500 mg Intravenous Q48H  . mouth rinse  15 mL Mouth Rinse BID  . megestrol  200 mg Oral Daily  . midodrine  5 mg Oral Q M,W,F-HD  . multivitamin  1 tablet Oral QHS  . oseltamivir  30 mg Oral Q M,W,F  . pantoprazole  40 mg Oral Daily    Dialysis Orders: MWF AF 4 hr 72.5 - leaving ~72 very low gains 2 K 2.25 Ca Heparin 3300 left AVGG no calcitriol or Fe, ESA Recent labs: hgb 11 22% sat ferrtin 2610 iPTH 180  alb 2.2 P 3 Access Hx - PTA 80 - 90% outflo ax vein stenosis and 70% venous stenosis 12/19/17- post procedure AF 600s which dropped to 300s twice in January (this was her pre procedure AF range- kinetics however great)  Assessment/Plan: 1. Sepsis/PNA/+ Influenza A: On Tamiflu. Vanc/Cefepime narrowed to Levaquin. Lungs with persistent rhonchi, getting albuterol neb prn. BCx 1/29 negative. 2. ESRD: Continue MWF schedule, next 2/5. No heparin d/t bleeding with prior HD. 3. BP/volume: BP chronically low/stable. On midodrine. Metoprolol on hold. 4. Anemia: Hgb 9.2, trending down. Aranesp 33mcg weekly started 2/2. 5. Metabolic bone disease: Phos low, binder on hold. No VDRA. 6. Severe protein malnutrition: Alb 1.6. Continue to liberalize diet to regular due to poor intake (she is on this at [facility]) supplements cause diarrhea by report; addedprostat/vitamins; encouraged her to at least sip liquids. On Megace daily as  well. Very poor appetite. 7. Paroxysmal AF: On amiodarone/ASA. 8. Increased LFTs: Per primary. Hepatitis panel negative. Improving.   [name], PA-C 01/25/2017, 8:50 AM  [facility] Pager: [phone]  Patient seen and examined. Agree with PA A/P Will write for standing Albuterol treatments; she feels much better after the treatments. Will request RT to perform pulmonary toilet; she's not strong enough at this time to expectorate the phlegm/ secretions. She is extremely weak with labored breathing; hopefully she'll be strong enough in the next few days for d/c.  Otelia Santee, MD [facility]

## 2017-01-25 NOTE — Progress Notes (Signed)
RT note: Rt instructed patient with flutter valve use. Patient had good productive cough with flutter valve use.

## 2017-01-25 NOTE — Progress Notes (Signed)
I was called by Dr. Hollie Salk from [facility].  Patient has HCAP with influenza and is an HD patient and is on the schedule for tomorrow.  Dr. Hollie Salk went to see her.  Patient is having significant difficulty coughing up secretions.  Likely needs more secretions than she can have on the floor.  Needs more aggressive pulmonary toilet.  q2h suction including deep suctioning through the night.  She does have CXR with pleural effusion, likely not related to need for suction.  She is on abx, ordered for breathing treatments.  Continuous pulse ox ordered by Dr. Hollie Salk.  Carlyon Shadow, M.D.

## 2017-01-25 NOTE — Significant Event (Signed)
Rapid Response Event Note  Overview:  Alerted by RT [name] James of patient with increased WOB - fatigue Time Called: 1615 Event Type: Respiratory  Initial Focused Assessment: Patinet supine in bed - alert - pale - weak but able to speak full sentence - frequent cough with thick yellow mucus - bil BS present but very coarse bilaterally all lobes - resps shallow - reports she is very weak and more difficult to breathe.  Mild use of accessory muscles with coughing.  O2 sats 93% on 2 liter nasal cannula.  RR 22.     Interventions:  Stat PCXR.  Repositioned in bed - complains of sore "bottom"  = stage II decubitus per RN [name] James - repositioned to side with relief - resting after.  Is due HD tomorrow per chart.  Son in room. RN [name] James paging Dr. Wendee James - orders for BNP also.  Handoff to night RRT RN.    Plan of Care (if not transferred):   Event Summary: Name of Physician Notified: Dr. Wendee James at  (pta RRT)    at  [address] [place]

## 2017-01-26 LAB — IRON AND TIBC: IRON: 59 ug/dL (ref 28–170)

## 2017-01-26 LAB — RENAL FUNCTION PANEL
Albumin: 1.6 g/dL — ABNORMAL LOW (ref 3.5–5.0)
Anion gap: 8 (ref 5–15)
BUN: 28 mg/dL — AB (ref 6–20)
CHLORIDE: 99 mmol/L — AB (ref 101–111)
CO2: 27 mmol/L (ref 22–32)
Calcium: 7.6 mg/dL — ABNORMAL LOW (ref 8.9–10.3)
Creatinine, Ser: 3.42 mg/dL — ABNORMAL HIGH (ref 0.44–1.00)
GFR calc Af Amer: 14 mL/min — ABNORMAL LOW (ref >60)
GFR, EST NON AFRICAN AMERICAN: 12 mL/min — AB (ref >60)
GLUCOSE: 118 mg/dL — AB (ref 65–99)
POTASSIUM: 3.8 mmol/L (ref 3.5–5.1)
Phosphorus: 1.7 mg/dL — ABNORMAL LOW (ref 2.5–4.6)
Sodium: 134 mmol/L — ABNORMAL LOW (ref 135–145)

## 2017-01-26 LAB — CBC
HEMATOCRIT: 24.7 % — AB (ref 36.0–46.0)
Hemoglobin: 7.8 g/dL — ABNORMAL LOW (ref 12.0–15.0)
MCH: 30.7 pg (ref 26.0–34.0)
MCHC: 31.6 g/dL (ref 30.0–36.0)
MCV: 97.2 fL (ref 78.0–100.0)
PLATELETS: 138 10*3/uL — AB (ref 150–400)
RBC: 2.54 MIL/uL — ABNORMAL LOW (ref 3.87–5.11)
RDW: 17.8 % — AB (ref 11.5–15.5)
WBC: 8 10*3/uL (ref 4.0–10.5)

## 2017-01-26 LAB — GLUCOSE, CAPILLARY
GLUCOSE-CAPILLARY: 107 mg/dL — AB (ref 65–99)
GLUCOSE-CAPILLARY: 85 mg/dL (ref 65–99)
GLUCOSE-CAPILLARY: 185 mg/dL — AB (ref 65–99)

## 2017-01-26 NOTE — Procedures (Signed)
  I was present at this dialysis session, have reviewed the session itself and made  appropriate changes Kelly Splinter MD [place] pager [phone]   01/26/2017, 10:19 AM

## 2017-01-26 NOTE — Progress Notes (Signed)
PROGRESS NOTE  [name] James MWU:[id number] DOB: July 27, 1936 DOA: 01/19/2017 PCP: [name], MD  HPI/Recap of past 24 hours:  Pt had increase in sob overnight. Xray showed fluid overload, patient currently in dialysis unit.  Assessment/Plan: Principal Problem:   Sepsis ([place]) Active Problems:   Essential hypertension   Anemia   ESRD (end stage renal disease) (HCC)   Paroxysmal atrial fibrillation (HCC)   Controlled type 2 diabetes mellitus with diabetic polyneuropathy, with long-term current use of insulin (HCC)   HCAP (healthcare-associated pneumonia)   Pressure ulcer  1. Sepsis from pneumonia:  Source of infection left lower lobe pna and influenza A. Organism unknown.  Sputum Culture and urine pna antigen test need to be collected, I have informed RN at bedside.  Patient meets criteria given tachypnea, leukocytosis, and evidence of organ dysfunction.  Lactate 2.2 mmol/L and repeat ordered within 6 hours.  This patient is at high risk of poor outcomes with a qSOFA score of 2.  Antibiotics delivered in the ED.  she is admitted to stepdown,  -Sepsis bundle utilized:             -Blood and urine cultures drawn             -30 ml/kg bolus given in ED, will repeat lactic acid             -Antibiotics: vancomycin and cefepime             -Will defer atypical coverage because recent doxycycline/appropriate treatment             -Oseltamivir renal dosing, as patient tested positive for flu - Pt transitioned to levaquin will treat for 10 days today is day 8/10. Treat longer given immunicompromissed state and slow improvement in respiratory condition.  2. Hypertension: presented with hypotension/sepsis On metoprolol daily, midodrine for intradialytic hypotension -patient presented with hypotension, sbp in 80's initial on presentation,  - will continue restarting b blocker pending blood pressures. Patient has had some low readings.  3. ESRD on HD MWF:  -Continue midodrine with  dialysis - nephrology consulted and assisting - Will assist with getting extra fluid off.  4. Pressure ulcer, presented on admission -Stage 2 Pressure injury sacrum with non blanchable redness over the buttocks.  presented on admission.  Patient is incontinent of stool, so the redness also can be related to MASD  Wound care input appreciated  5. Diabetes/hyperglycemia:  - currently on ssi  6. pAF:  CHADS2Vasc 7.  Not on warfarin because of risk of bleeding. -Continue amiodarone -Continue full aspirin  7. Other medications:  -Continue allopurinol and PPI  8. Anemia: Stable at baseline  9. Chronic diastolic CHF: Fluid overloaded. Will need to have extra fluid pulled off in dialysis. -Strict I/Os  10. Elevated LFTs: No abdominal pain or nausea.  Presumably from sepsis/influenza. -Trend LFTs , u/s reviewed. - trending down on last check  11. Hypokalemia - resolved   FTT: baseline bed to wheelchair bound, need lift for transfer.  DVT prophylaxis: Lovenox    Code Status: full  Family Communication: patient   Disposition Plan: pending improvement in respiratory condition. May transfer out of step down today pending improvement in condition.   Consultants:  nephrology  Procedures:  Dialysis MWF  Antibiotics: Levaquin, Tamiflu   Objective: BP (!) 129/47 (BP Location: Right Arm)   Pulse 79   Temp 98.5 F (36.9 C) (Axillary)   Resp (!) 22   Ht 5\' 3"  (1.6 m)  Wt 71.9 kg (158 lb 8.2 oz)   SpO2 100%   BMI 28.08 kg/m   Intake/Output Summary (Last 24 hours) at 01/26/17 1006 Last data filed at 01/25/17 1830  Gross per 24 hour  Intake              420 ml  Output                0 ml  Net              420 ml   Filed Weights   01/25/17 2041 01/26/17 0500 01/26/17 0852  Weight: 73.4 kg (161 lb 14.4 oz) 73.5 kg (162 lb 1.6 oz) 71.9 kg (158 lb 8.2 oz)    Exam:   General:  Frail, alert and awake, NAD  Cardiovascular: RRR, no  rubs  Respiratory: +rhonchi, crackles, no wheezes, equal chest rise.  Abdomen: Soft/ND/NT, positive BS  Musculoskeletal: no cyanosis, equal tone.  Neuro: aaox3  Data Reviewed: Basic Metabolic Panel:  Recent Labs Lab 01/21/17 0436 01/22/17 0656 01/23/17 0732 01/25/17 0446 01/26/17 0915  NA 135 137 135 134* 134*  K 3.1* 4.0 4.2 3.5 3.8  CL 101 102 102 101 99*  CO2 25 30 27 28 27   GLUCOSE 91 67 91 128* 118*  BUN 17 5* 11 16 28*  CREATININE 2.85* 1.65* 2.61* 2.58* 3.42*  CALCIUM 6.9* 7.1* 7.3* 7.4* 7.6*  PHOS  --  1.4* 1.8*  --  1.7*   Liver Function Tests:  Recent Labs Lab 01/19/17 1725 01/20/17 0634 01/21/17 0436 01/23/17 0732 01/25/17 0446 01/26/17 0915  AST 272* 222* 196*  --  139*  --   ALT 158* 134* 118*  --  98*  --   ALKPHOS 149* 106 108  --  90  --   BILITOT 1.3* 1.1 1.0  --  0.8  --   PROT 6.7 5.3* 5.1*  --  5.1*  --   ALBUMIN 1.9* 1.6* 1.4* 1.5* 1.6* 1.6*   No results for input(s): LIPASE, AMYLASE in the last 168 hours. No results for input(s): AMMONIA in the last 168 hours. CBC:  Recent Labs Lab 01/19/17 1725 01/20/17 0634 01/23/17 0732 01/26/17 0915  WBC 12.5* 9.6 7.4 8.0  NEUTROABS 8.1*  --   --   --   HGB 11.2* 9.4* 9.2* 7.8*  HCT 36.5 31.1* 30.3* 24.7*  MCV 99.7 100.0 99.0 97.2  PLT 166 104* 119* 138*   Cardiac Enzymes:    Recent Labs Lab 01/21/17 0436  CKTOTAL 982*   BNP (last 3 results)  Recent Labs  01/20/17 0634 01/25/17 1742  BNP 451.4* 214.8*    ProBNP (last 3 results) No results for input(s): PROBNP in the last 8760 hours.  CBG:  Recent Labs Lab 01/25/17 0801 01/25/17 1204 01/25/17 1705 01/25/17 2146 01/26/17 0757  GLUCAP 100* 143* 120* 126* 107*    Recent Results (from the past 240 hour(s))  Culture, blood (Routine x 2)     Status: None   Collection Time: 01/19/17  5:22 PM  Result Value Ref Range Status   Specimen Description BLOOD RIGHT ANTECUBITAL  Final   Special Requests BOTTLES DRAWN AEROBIC  AND ANAEROBIC 5CC  Final   Culture NO GROWTH 5 DAYS  Final   Report Status 01/24/2017 FINAL  Final  MRSA PCR Screening     Status: None   Collection Time: 01/20/17 12:10 PM  Result Value Ref Range Status   MRSA by PCR NEGATIVE NEGATIVE Final  Comment:        The GeneXpert MRSA Assay (FDA approved for NASAL specimens only), is one component of a comprehensive MRSA colonization surveillance program. It is not intended to diagnose MRSA infection nor to guide or monitor treatment for MRSA infections.   Culture, sputum-assessment     Status: None   Collection Time: 01/20/17 10:31 PM  Result Value Ref Range Status   Specimen Description EXPECTORATED SPUTUM  Final   Special Requests NONE  Final   Sputum evaluation   Final    Sputum specimen not acceptable for testing.  Please recollect.   Gram Stain Report Called to,Read Back By and Verified With: MILLER,RN @0132  01/21/17 MKELLY,MLT    Report Status 01/21/2017 FINAL  Final  Culture, expectorated sputum-assessment     Status: None   Collection Time: 01/24/17  2:23 PM  Result Value Ref Range Status   Specimen Description EXPECTORATED SPUTUM  Final   Special Requests NONE  Final   Sputum evaluation THIS SPECIMEN IS ACCEPTABLE FOR SPUTUM CULTURE  Final   Report Status 01/24/2017 FINAL  Final  Culture, respiratory (NON-Expectorated)     Status: None (Preliminary result)   Collection Time: 01/24/17  2:23 PM  Result Value Ref Range Status   Specimen Description EXPECTORATED SPUTUM  Final   Special Requests NONE Reflexed from [id number]  Final   Gram Stain   Final    ABUNDANT WBC PRESENT, PREDOMINANTLY PMN MODERATE GRAM POSITIVE COCCI IN PAIRS MODERATE GRAM POSITIVE COCCI IN CHAINS FEW GRAM NEGATIVE RODS FEW YEAST    Culture CULTURE REINCUBATED FOR BETTER GROWTH  Final   Report Status PENDING  Incomplete     Studies: Dg Chest Port 1 View  Result Date: 01/25/2017 CLINICAL DATA:  Respiratory distress. Rapid response called. Hx  diabetes, HTN, HLD, CHF, A. Fib, never smoker. EXAM: PORTABLE CHEST 1 VIEW COMPARISON:  Chest x-rays dated 01/19/2017 and 12/18/2014. FINDINGS: Heart size is grossly stable, partially obscured by the opacity at the left lung base. Atherosclerotic changes noted at the aortic arch. Central pulmonary vascular congestion and mild bilateral interstitial edema persists. Interval enlargement of the left lower lobe airspace opacity, likely a combination of atelectasis and layering pleural effusion. Pleural effusion is likely moderate in size. Suspect additional small right pleural effusion. IMPRESSION: 1. Central pulmonary vascular congestion and mild bilateral interstitial edema indicating some degree of CHF/volume overload. 2. Increased opacity at the left lung base, likely a combination of atelectasis and layering pleural effusion. Pleural effusion is likely moderate in size. 3. Probable small right pleural effusion. 4. Aortic atherosclerosis. Electronically Signed   By: Franki Cabot M.D.   On: 01/25/2017 17:24    Scheduled Meds: . albuterol  2.5 mg Nebulization Q6H  . allopurinol  100 mg Oral Daily  . amiodarone  200 mg Oral Daily  . aspirin  325 mg Oral Daily  . darbepoetin (ARANESP) injection - DIALYSIS  60 mcg Intravenous Q Fri-HD  . dextromethorphan-guaiFENesin  1 tablet Oral BID  . feeding supplement (PRO-STAT SUGAR FREE 64)  30 mL Oral BID  . heparin  5,000 Units Subcutaneous Q8H  . insulin aspart  0-5 Units Subcutaneous QHS  . insulin aspart  0-9 Units Subcutaneous TID WC  . levofloxacin (LEVAQUIN) IV  500 mg Intravenous Q48H  . mouth rinse  15 mL Mouth Rinse BID  . megestrol  200 mg Oral Daily  . midodrine  10 mg Oral Q M,W,F-HD  . multivitamin  1 tablet Oral QHS  . oseltamivir  30 mg Oral Q M,W,F  . pantoprazole  40 mg Oral Daily    Continuous Infusions:   Time spent: 13mins  Velvet Bathe MD, PhD  [facility] Pager [phone] If 7PM-7AM, please contact night-coverage at  [email or web address], password [email or web address] 01/26/2017, 10:06 AM  LOS: 7 days

## 2017-01-26 NOTE — Progress Notes (Signed)
Patient returned to department from [facility] No s/s of distress. Will continue to monitor.

## 2017-01-26 NOTE — Progress Notes (Signed)
[name] James Progress Note   Subjective: had SOB episode, RRT was called. Feeling better this am. Wet cough, very tired and weak.  CXR last night showed bibasilar disease and mild CHF.    Vitals:   01/26/17 0852 01/26/17 0907 01/26/17 0915 01/26/17 0945  BP: (!) 142/48 (!) 131/52 (!) 123/48 (!) 129/47  Pulse: 81 77 74 79  Resp: 20 (!) 22    Temp: 98.5 F (36.9 C)     TempSrc: Axillary     SpO2: 100%     Weight: 71.9 kg (158 lb 8.2 oz)     Height:        Inpatient medications: . albuterol  2.5 mg Nebulization Q6H  . allopurinol  100 mg Oral Daily  . amiodarone  200 mg Oral Daily  . aspirin  325 mg Oral Daily  . darbepoetin (ARANESP) injection - DIALYSIS  60 mcg Intravenous Q Fri-HD  . dextromethorphan-guaiFENesin  1 tablet Oral BID  . feeding supplement (PRO-STAT SUGAR FREE 64)  30 mL Oral BID  . heparin  5,000 Units Subcutaneous Q8H  . insulin aspart  0-5 Units Subcutaneous QHS  . insulin aspart  0-9 Units Subcutaneous TID WC  . levofloxacin (LEVAQUIN) IV  500 mg Intravenous Q48H  . mouth rinse  15 mL Mouth Rinse BID  . megestrol  200 mg Oral Daily  . midodrine  10 mg Oral Q M,W,F-HD  . multivitamin  1 tablet Oral QHS  . oseltamivir  30 mg Oral Q M,W,F  . pantoprazole  40 mg Oral Daily    acetaminophen **OR** acetaminophen, albuterol, diazepam, guaiFENesin-dextromethorphan, ondansetron **OR** ondansetron (ZOFRAN) IV  Exam: Wet cough, frail elderly WF no distress No jvd Chest coarse rhonchi bilat, no ^wob RRR no mrg Abd soft ntnd +bs No LE edema LUE AVG+bruit Neuro gen'd weakness, minimal LE motor strength (chronic)  Dialysis: MWF AF 4h  72.5kg   2/2.25 bath  Hep 3300  LUA AVG No meds Labs: hb 11 tfs 22%pth 180  P 3 Access Hx - PTA 80 - 90% outflo ax vein stenosis and 70% venous stenosis 12/19/17- post procedure AF 600s which dropped to 300s twice in January (this was her pre procedure AF range- kinetics however great)     Assessment: 1. PNA/  sepsis/ flu A+ - on vanc/ cefepime/ tamiflu. May have element CHF last pm cxr as well. A little better. BCx neg.  2. ESRD MWF HD 3. Hypotension - chronic on midodrine 4. Anemia - Hb down 7.8, started ESA here darbe 60/wk. Hb 11 as OP. Fe/ TIBC + stool guiac ordered 5. MBD binder on hold for low P 6. Nutrition - low alb 1.6.  Regular diet, vitamins, etc.   7. ^LFT's - Hep panel neg, improving 8. Chronic debility - living in SNF 3 yrs , WC/bedbound, requires assist for all transfers. Prognosis poor. Discussed EOL w daughter Kenney Houseman briefly, they want full code and full Rx.   Plan - HD today, UF 2-3kg as tolerated.     Kelly Splinter MD [facility] pager [phone]   01/26/2017, 10:10 AM    Recent Labs Lab 01/22/17 0656 01/23/17 0732 01/25/17 0446 01/26/17 0915  NA 137 135 134* 134*  K 4.0 4.2 3.5 3.8  CL 102 102 101 99*  CO2 30 27 28 27   GLUCOSE 67 91 128* 118*  BUN 5* 11 16 28*  CREATININE 1.65* 2.61* 2.58* 3.42*  CALCIUM 7.1* 7.3* 7.4* 7.6*  PHOS 1.4* 1.8*  --  1.7*    Recent Labs Lab 01/20/17 0634 01/21/17 0436 01/23/17 0732 01/25/17 0446 01/26/17 0915  AST 222* 196*  --  139*  --   ALT 134* 118*  --  98*  --   ALKPHOS 106 108  --  90  --   BILITOT 1.1 1.0  --  0.8  --   PROT 5.3* 5.1*  --  5.1*  --   ALBUMIN 1.6* 1.4* 1.5* 1.6* 1.6*    Recent Labs Lab 01/19/17 1725 01/20/17 0634 01/23/17 0732 01/26/17 0915  WBC 12.5* 9.6 7.4 8.0  NEUTROABS 8.1*  --   --   --   HGB 11.2* 9.4* 9.2* 7.8*  HCT 36.5 31.1* 30.3* 24.7*  MCV 99.7 100.0 99.0 97.2  PLT 166 104* 119* 138*   Iron/TIBC/Ferritin/ %Sat    Component Value Date/Time   IRON 25 (L) 10/27/2014 0435   TIBC 328 10/27/2014 0435   FERRITIN 29 10/27/2014 0435   IRONPCTSAT 8 (L) 10/27/2014 0435   IRONPCTSAT 22 09/06/2008 1552

## 2017-01-27 LAB — GLUCOSE, CAPILLARY
GLUCOSE-CAPILLARY: 88 mg/dL (ref 65–99)
GLUCOSE-CAPILLARY: 139 mg/dL — AB (ref 65–99)
Glucose-Capillary: 193 mg/dL — ABNORMAL HIGH (ref 65–99)
Glucose-Capillary: 135 mg/dL — ABNORMAL HIGH (ref 65–99)

## 2017-01-27 LAB — CULTURE, RESPIRATORY

## 2017-01-27 MED ORDER — BOOST / RESOURCE BREEZE PO LIQD
1.0000 | Freq: Three times a day (TID) | ORAL | Status: DC
  Administered 2017-01-27 – 2017-01-29 (×3): 1 via ORAL

## 2017-01-27 NOTE — Progress Notes (Signed)
PROGRESS NOTE  [name] James UXN:[id number] DOB: [birth year] DOA: 01/19/2017 PCP: [name], MD  HPI/Recap of past 24 hours:  Pt is more alert than she was prior. She reports no new complaints to me today.  Assessment/Plan: Principal Problem:   Sepsis ([place]) Active Problems:   Essential hypertension   Anemia   ESRD (end stage renal disease) (HCC)   Paroxysmal atrial fibrillation (HCC)   Controlled type 2 diabetes mellitus with diabetic polyneuropathy, with long-term current use of insulin (HCC)   HCAP (healthcare-associated pneumonia)   Pressure ulcer  1. Sepsis from pneumonia:  Source of infection left lower lobe pna and influenza A. Organism unknown.  Sputum Culture and urine pna antigen test need to be collected, I have informed RN at bedside.  Patient meets criteria given tachypnea, leukocytosis, and evidence of organ dysfunction.  Lactate 2.2 mmol/L and repeat ordered within 6 hours.  This patient is at high risk of poor outcomes with a qSOFA score of 2.   -Oseltamivir renal dosing, as patient tested positive for flu - Pt transitioned to levaquin will treat for 10 days today is day 9/10. Treat longer given immunicompromissed state and slow improvement in respiratory condition.  2. Hypertension: presented with hypotension/sepsis midodrine for intradialytic hypotension -patient presented with hypotension, sbp in 80's initial on presentation,  - will continue restarting b blocker pending blood pressures. Continue to hold.  3. ESRD on HD MWF:  -Continue midodrine with dialysis - nephrology consulted and assisting - Assisting with getting extra fluid off. Agree with nephrology that due to disease burden patient should be transitioned to DNR status as I do not suspect prognosis would be promising if she were to code.  4. Pressure ulcer, presented on admission -Stage 2 Pressure injury sacrum with non blanchable redness over the buttocks.  presented on admission.    Patient is incontinent of stool, so the redness also can be related to MASD  Wound care input appreciated  5. Diabetes/hyperglycemia:  - currently on ssi  6. pAF:  CHADS2Vasc 7.  Not on warfarin because of risk of bleeding. -Continue amiodarone -Continue full aspirin  7. Other medications:  -Continue allopurinol and PPI  8. Anemia: Stable at baseline  9. Chronic diastolic CHF: -Strict I/Os - volume control with dialysis  10. Elevated LFTs: No abdominal pain or nausea.  Presumably from sepsis/influenza. -Trend LFTs , u/s reviewed. - trending down on last check  11. Hypokalemia - resolved   FTT: baseline bed to wheelchair bound, need lift for transfer.  DVT prophylaxis: Lovenox    Code Status: full  Family Communication: patient also has been discussed with daughter   Disposition Plan: pending improvement in respiratory condition. May transfer out of step down    Consultants:  nephrology  Procedures:  Dialysis MWF  Antibiotics: Levaquin, Tamiflu   Objective: BP (!) 120/40   Pulse 71   Temp 98.7 F (37.1 C) (Oral)   Resp 18   Ht 5\' 3"  (1.6 m)   Wt 74 kg (163 lb 3.2 oz)   SpO2 99%   BMI 28.91 kg/m   Intake/Output Summary (Last 24 hours) at 01/27/17 1808 Last data filed at 01/27/17 1300  Gross per 24 hour  Intake              240 ml  Output                0 ml  Net  240 ml   Filed Weights   01/26/17 0852 01/26/17 1325 01/27/17 0500  Weight: 71.9 kg (158 lb 8.2 oz) 70.1 kg (154 lb 8.7 oz) 74 kg (163 lb 3.2 oz)    Exam:   General:  Frail, alert and awake, NAD  Cardiovascular: RRR, no rubs  Respiratory: +rhonchi, crackles, no wheezes, equal chest rise.  Abdomen: Soft/ND/NT, positive BS  Musculoskeletal: no cyanosis, equal tone.  Neuro: aaox3  Data Reviewed: Basic Metabolic Panel:  Recent Labs Lab 01/21/17 0436 01/22/17 0656 01/23/17 0732 01/25/17 0446 01/26/17 0915  NA 135 137 135 134* 134*  K 3.1*  4.0 4.2 3.5 3.8  CL 101 102 102 101 99*  CO2 25 30 27 28 27   GLUCOSE 91 67 91 128* 118*  BUN 17 5* 11 16 28*  CREATININE 2.85* 1.65* 2.61* 2.58* 3.42*  CALCIUM 6.9* 7.1* 7.3* 7.4* 7.6*  PHOS  --  1.4* 1.8*  --  1.7*   Liver Function Tests:  Recent Labs Lab 01/21/17 0436 01/23/17 0732 01/25/17 0446 01/26/17 0915  AST 196*  --  139*  --   ALT 118*  --  98*  --   ALKPHOS 108  --  90  --   BILITOT 1.0  --  0.8  --   PROT 5.1*  --  5.1*  --   ALBUMIN 1.4* 1.5* 1.6* 1.6*   No results for input(s): LIPASE, AMYLASE in the last 168 hours. No results for input(s): AMMONIA in the last 168 hours. CBC:  Recent Labs Lab 01/23/17 0732 01/26/17 0915  WBC 7.4 8.0  HGB 9.2* 7.8*  HCT 30.3* 24.7*  MCV 99.0 97.2  PLT 119* 138*   Cardiac Enzymes:    Recent Labs Lab 01/21/17 0436  CKTOTAL 982*   BNP (last 3 results)  Recent Labs  01/20/17 0634 01/25/17 1742  BNP 451.4* 214.8*    ProBNP (last 3 results) No results for input(s): PROBNP in the last 8760 hours.  CBG:  Recent Labs Lab 01/26/17 1631 01/26/17 2146 01/27/17 0800 01/27/17 1232 01/27/17 1658  GLUCAP 85 185* 88 139* 193*    Recent Results (from the past 240 hour(s))  Culture, blood (Routine x 2)     Status: None   Collection Time: 01/19/17  5:22 PM  Result Value Ref Range Status   Specimen Description BLOOD RIGHT ANTECUBITAL  Final   Special Requests BOTTLES DRAWN AEROBIC AND ANAEROBIC 5CC  Final   Culture NO GROWTH 5 DAYS  Final   Report Status 01/24/2017 FINAL  Final  MRSA PCR Screening     Status: None   Collection Time: 01/20/17 12:10 PM  Result Value Ref Range Status   MRSA by PCR NEGATIVE NEGATIVE Final    Comment:        The GeneXpert MRSA Assay (FDA approved for NASAL specimens only), is one component of a comprehensive MRSA colonization surveillance program. It is not intended to diagnose MRSA infection nor to guide or monitor treatment for MRSA infections.   Culture,  sputum-assessment     Status: None   Collection Time: 01/20/17 10:31 PM  Result Value Ref Range Status   Specimen Description EXPECTORATED SPUTUM  Final   Special Requests NONE  Final   Sputum evaluation   Final    Sputum specimen not acceptable for testing.  Please recollect.   Gram Stain Report Called to,Read Back By and Verified With: MILLER,RN @0132  01/21/17 MKELLY,MLT    Report Status 01/21/2017 FINAL  Final  Culture, expectorated  sputum-assessment     Status: None   Collection Time: 01/24/17  2:23 PM  Result Value Ref Range Status   Specimen Description EXPECTORATED SPUTUM  Final   Special Requests NONE  Final   Sputum evaluation THIS SPECIMEN IS ACCEPTABLE FOR SPUTUM CULTURE  Final   Report Status 01/24/2017 FINAL  Final  Culture, respiratory (NON-Expectorated)     Status: None   Collection Time: 01/24/17  2:23 PM  Result Value Ref Range Status   Specimen Description EXPECTORATED SPUTUM  Final   Special Requests NONE Reflexed from [id number]  Final   Gram Stain   Final    ABUNDANT WBC PRESENT, PREDOMINANTLY PMN MODERATE GRAM POSITIVE COCCI IN PAIRS MODERATE GRAM POSITIVE COCCI IN CHAINS FEW GRAM NEGATIVE RODS FEW YEAST    Culture   Final    MODERATE METHICILLIN RESISTANT STAPHYLOCOCCUS AUREUS   Report Status 01/27/2017 FINAL  Final   Organism ID, Bacteria METHICILLIN RESISTANT STAPHYLOCOCCUS AUREUS  Final      Susceptibility   Methicillin resistant staphylococcus aureus - MIC*    CIPROFLOXACIN >=8 RESISTANT Resistant     ERYTHROMYCIN >=8 RESISTANT Resistant     GENTAMICIN <=0.5 SENSITIVE Sensitive     OXACILLIN >=4 RESISTANT Resistant     TETRACYCLINE <=1 SENSITIVE Sensitive     VANCOMYCIN <=0.5 SENSITIVE Sensitive     TRIMETH/SULFA >=320 RESISTANT Resistant     CLINDAMYCIN <=0.25 SENSITIVE Sensitive     RIFAMPIN <=0.5 SENSITIVE Sensitive     Inducible Clindamycin NEGATIVE Sensitive     * MODERATE METHICILLIN RESISTANT STAPHYLOCOCCUS AUREUS     Studies: No results  found.  Scheduled Meds: . allopurinol  100 mg Oral Daily  . amiodarone  200 mg Oral Daily  . aspirin  325 mg Oral Daily  . darbepoetin (ARANESP) injection - DIALYSIS  60 mcg Intravenous Q Fri-HD  . dextromethorphan-guaiFENesin  1 tablet Oral BID  . feeding supplement  1 Container Oral TID BM  . heparin  5,000 Units Subcutaneous Q8H  . insulin aspart  0-5 Units Subcutaneous QHS  . insulin aspart  0-9 Units Subcutaneous TID WC  . levofloxacin (LEVAQUIN) IV  500 mg Intravenous Q48H  . mouth rinse  15 mL Mouth Rinse BID  . megestrol  200 mg Oral Daily  . midodrine  10 mg Oral Q M,W,F-HD  . multivitamin  1 tablet Oral QHS  . pantoprazole  40 mg Oral Daily    Continuous Infusions:   Time spent: 97mins  Velvet Bathe MD, PhD  [facility] Pager [phone] If 7PM-7AM, please contact night-coverage at [email or web address], password [facility] 01/27/2017, 6:08 PM  LOS: 8 days

## 2017-01-27 NOTE — Progress Notes (Signed)
[facility] Progress Note   Subjective: still very tired and weak, no new c/o's.  1.8L off w HD yest  Vitals:   01/27/17 0216 01/27/17 0400 01/27/17 0500 01/27/17 0757  BP:  (!) 96/40  (!) 109/40  Pulse:  71  72  Resp:  18  18  Temp:  98.3 F (36.8 C)  98.3 F (36.8 C)  TempSrc:  Oral  Oral  SpO2: 100% 100%  98%  Weight:   74 kg (163 lb 3.2 oz)   Height:        Inpatient medications: . albuterol  2.5 mg Nebulization Q6H  . allopurinol  100 mg Oral Daily  . amiodarone  200 mg Oral Daily  . aspirin  325 mg Oral Daily  . darbepoetin (ARANESP) injection - DIALYSIS  60 mcg Intravenous Q Fri-HD  . dextromethorphan-guaiFENesin  1 tablet Oral BID  . feeding supplement  1 Container Oral TID BM  . heparin  5,000 Units Subcutaneous Q8H  . insulin aspart  0-5 Units Subcutaneous QHS  . insulin aspart  0-9 Units Subcutaneous TID WC  . levofloxacin (LEVAQUIN) IV  500 mg Intravenous Q48H  . mouth rinse  15 mL Mouth Rinse BID  . megestrol  200 mg Oral Daily  . midodrine  10 mg Oral Q M,W,F-HD  . multivitamin  1 tablet Oral QHS  . pantoprazole  40 mg Oral Daily    acetaminophen **OR** acetaminophen, albuterol, diazepam, guaiFENesin-dextromethorphan, ondansetron **OR** ondansetron (ZOFRAN) IV  Exam: Wet cough, frail elderly WF no distress No jvd Chest coarse rhonchi bilat, no ^wob RRR no mrg Abd soft ntnd +bs No LE edema LUE AVG+bruit Neuro gen'd weakness, minimal LE motor strength (chronic)  Dialysis: MWF AF 4h  72.5kg   2/2.25 bath  Hep 3300  LUA AVG No meds Labs: hb 11 tfs 22%pth 180  P 3 Access Hx - PTA 80 - 90% outflo ax vein stenosis and 70% venous stenosis 12/19/17- post procedure AF 600s which dropped to 300s twice in January (this was her pre procedure AF range- kinetics however great)     Assessment: 1. PNA/ sepsis/ flu A+ - on vanc/ cefepime/ tamiflu. BCx neg. Cough/ SOB improving , but not getting any stronger overall, looks weaker.  2. ESRD MWF  HD 3. Volume - didn't tolerate much UF yest, no CHF clinically 4. Hypotension - chronic on midodrine 5. Anemia - Hb down 7.8, started ESA here darbe 60/wk. Hb 11 as OP. Fe/ TIBC + stool guiac ordered 6. MBD binder on hold for low P 7. Nutrition - low alb 1.6.  Regular diet, vitamins, etc.   8. ^LFT's - Hep panel neg, improving 9. Chronic debility - living in SNF 3 yrs , WC/bedbound, requires assist for all transfers. Prognosis poor. D/W pt's son in room today, Donnie, he said that he and his sister Kenney Houseman would like to meet with the doctors to discuss "the big picture". Will consult palliative care.  Renal service happy to attend any family meetings too if needed.  Have d/w primary MD.    Kelly Splinter MD [facility] pager [phone]   01/27/2017, 12:17 PM    Recent Labs Lab 01/22/17 0656 01/23/17 0732 01/25/17 0446 01/26/17 0915  NA 137 135 134* 134*  K 4.0 4.2 3.5 3.8  CL 102 102 101 99*  CO2 30 27 28 27   GLUCOSE 67 91 128* 118*  BUN 5* 11 16 28*  CREATININE 1.65* 2.61* 2.58* 3.42*  CALCIUM 7.1*  7.3* 7.4* 7.6*  PHOS 1.4* 1.8*  --  1.7*    Recent Labs Lab 01/21/17 0436 01/23/17 0732 01/25/17 0446 01/26/17 0915  AST 196*  --  139*  --   ALT 118*  --  98*  --   ALKPHOS 108  --  90  --   BILITOT 1.0  --  0.8  --   PROT 5.1*  --  5.1*  --   ALBUMIN 1.4* 1.5* 1.6* 1.6*    Recent Labs Lab 01/23/17 0732 01/26/17 0915  WBC 7.4 8.0  HGB 9.2* 7.8*  HCT 30.3* 24.7*  MCV 99.0 97.2  PLT 119* 138*   Iron/TIBC/Ferritin/ %Sat    Component Value Date/Time   IRON 59 01/26/2017 1515   TIBC NOT CALCULATED 01/26/2017 1515   FERRITIN 29 10/27/2014 0435   IRONPCTSAT NOT CALCULATED 01/26/2017 1515   IRONPCTSAT 22 09/06/2008 1552

## 2017-01-27 NOTE — Progress Notes (Addendum)
Initial Nutrition Assessment  DOCUMENTATION CODES:   Not applicable  INTERVENTION:    D/C Prostat liquid protein   Boost Breeze po TID, each supplement provides 250 kcal and 9 grams of protein  NUTRITION DIAGNOSIS:   Increased nutrient needs related to chronic illness as evidenced by estimated needs  GOAL:   Patient will meet greater than or equal to 90% of their needs  MONITOR:   PO intake, Supplement acceptance, Labs, Weight trends, Skin, I & O's  REASON FOR ASSESSMENT:   Consult  (supplements )  ASSESSMENT:   81 y.o. Female with a past medical history significant for ESRD on HD MWF, IDDM, HTN, Afib not on warfarin because of bleeding risk who presents with cough.  Pt slouched over in bed sleeping upon RD visit. Resides at [facility]. PO intake 75% per flowsheet records. Prostat 30 ml BID ordered; pt refusing. CBG's [id number].  RD unable to complete Nutrition Focused Physical Exam at this time.  Diet Order:  Diet regular Room service appropriate? Yes; Fluid consistency: Thin; Fluid restriction: 1200 mL Fluid  Skin:  Wound (see comment) (Stage II to sacrum)  Last BM:  2/5  Height:   Ht Readings from Last 1 Encounters:  01/20/17 5\' 3"  (1.6 m)    Weight:   Wt Readings from Last 1 Encounters:  01/27/17 163 lb 3.2 oz (74 kg)    Ideal Body Weight:  52.2 kg  BMI:  Body mass index is 28.91 kg/m.  Estimated Nutritional Needs:   Kcal:  1600-1800  Protein:  80-90 gm  Fluid:  1.6-1.8 L  EDUCATION NEEDS:   No education needs identified at this time  Arthur Holms, RD, LDN Pager #: [phone] After-Hours Pager #: [phone]

## 2017-01-27 NOTE — Progress Notes (Signed)
CSW continuing to follow for transfer back to [facility] when medically stable  Jorge Ny, [place] Social Worker [phone]

## 2017-01-27 NOTE — Progress Notes (Signed)
No charge note.  Palliative consult received.  Daughter- Lavella Lemons- to return my call with meeting time for tomorrow.   Mariana Kaufman, AGNP-C Palliative Medicine  Please call Palliative Medicine team phone with any questions [phone]. For individual providers please see AMION.

## 2017-01-28 ENCOUNTER — Inpatient Hospital Stay (HOSPITAL_COMMUNITY): Payer: Medicare Other

## 2017-01-28 DIAGNOSIS — L89002 Pressure ulcer of unspecified elbow, stage 2: Secondary | ICD-10-CM

## 2017-01-28 DIAGNOSIS — Z7401 Bed confinement status: Secondary | ICD-10-CM

## 2017-01-28 DIAGNOSIS — R627 Adult failure to thrive: Secondary | ICD-10-CM

## 2017-01-28 DIAGNOSIS — J15212 Pneumonia due to Methicillin resistant Staphylococcus aureus: Secondary | ICD-10-CM

## 2017-01-28 LAB — COMPREHENSIVE METABOLIC PANEL
ALBUMIN: 1.5 g/dL — AB (ref 3.5–5.0)
ALK PHOS: 102 U/L (ref 38–126)
ALT: 108 U/L — AB (ref 14–54)
AST: 142 U/L — ABNORMAL HIGH (ref 15–41)
Anion gap: 10 (ref 5–15)
BUN: 16 mg/dL (ref 6–20)
CALCIUM: 7.3 mg/dL — AB (ref 8.9–10.3)
CO2: 29 mmol/L (ref 22–32)
CREATININE: 2.92 mg/dL — AB (ref 0.44–1.00)
Chloride: 96 mmol/L — ABNORMAL LOW (ref 101–111)
GFR calc Af Amer: 17 mL/min — ABNORMAL LOW (ref >60)
GFR calc non Af Amer: 14 mL/min — ABNORMAL LOW (ref >60)
GLUCOSE: 117 mg/dL — AB (ref 65–99)
Potassium: 3.5 mmol/L (ref 3.5–5.1)
SODIUM: 135 mmol/L (ref 135–145)
Total Bilirubin: 1 mg/dL (ref 0.3–1.2)
Total Protein: 5.1 g/dL — ABNORMAL LOW (ref 6.5–8.1)

## 2017-01-28 LAB — RENAL FUNCTION PANEL
ALBUMIN: 1.5 g/dL — AB (ref 3.5–5.0)
ANION GAP: 8 (ref 5–15)
BUN: 11 mg/dL (ref 6–20)
CO2: 28 mmol/L (ref 22–32)
Calcium: 7.2 mg/dL — ABNORMAL LOW (ref 8.9–10.3)
Chloride: 97 mmol/L — ABNORMAL LOW (ref 101–111)
Creatinine, Ser: 1.99 mg/dL — ABNORMAL HIGH (ref 0.44–1.00)
GFR calc non Af Amer: 23 mL/min — ABNORMAL LOW (ref >60)
GFR, EST AFRICAN AMERICAN: 26 mL/min — AB (ref >60)
GLUCOSE: 122 mg/dL — AB (ref 65–99)
PHOSPHORUS: 1.3 mg/dL — AB (ref 2.5–4.6)
POTASSIUM: 3.5 mmol/L (ref 3.5–5.1)
Sodium: 133 mmol/L — ABNORMAL LOW (ref 135–145)

## 2017-01-28 LAB — CBC
HEMATOCRIT: 23.8 % — AB (ref 36.0–46.0)
HEMOGLOBIN: 7.6 g/dL — AB (ref 12.0–15.0)
MCH: 30.9 pg (ref 26.0–34.0)
MCHC: 31.9 g/dL (ref 30.0–36.0)
MCV: 96.7 fL (ref 78.0–100.0)
Platelets: 167 10*3/uL (ref 150–400)
RBC: 2.46 MIL/uL — AB (ref 3.87–5.11)
RDW: 18.8 % — ABNORMAL HIGH (ref 11.5–15.5)
WBC: 7.2 10*3/uL (ref 4.0–10.5)

## 2017-01-28 LAB — GLUCOSE, CAPILLARY
Glucose-Capillary: 113 mg/dL — ABNORMAL HIGH (ref 65–99)
Glucose-Capillary: 76 mg/dL (ref 65–99)
Glucose-Capillary: 121 mg/dL — ABNORMAL HIGH (ref 65–99)
Glucose-Capillary: 137 mg/dL — ABNORMAL HIGH (ref 65–99)

## 2017-01-28 MED ORDER — LIDOCAINE HCL (PF) 1 % IJ SOLN: 5.0000 mL | INTRAMUSCULAR | Status: DC | PRN

## 2017-01-28 MED ORDER — SODIUM CHLORIDE 0.9 % IV SOLN: 100.0000 mL | INTRAVENOUS | Status: DC | PRN

## 2017-01-28 MED ORDER — GUAIFENESIN ER 600 MG PO TB12
600.0000 mg | ORAL_TABLET | Freq: Two times a day (BID) | ORAL | Status: DC
  Administered 2017-01-28 – 2017-02-03 (×12): 600 mg via ORAL
  Filled 2017-01-28 (×12): qty 1

## 2017-01-28 MED ORDER — HEPARIN SODIUM (PORCINE) 1000 UNIT/ML DIALYSIS: 3000.0000 [IU] | Freq: Once | INTRAMUSCULAR | Status: DC

## 2017-01-28 MED ORDER — VANCOMYCIN HCL 10 G IV SOLR
1750.0000 mg | Freq: Once | INTRAVENOUS | Status: AC
  Administered 2017-01-28: 1750 mg via INTRAVENOUS
  Filled 2017-01-28: qty 1750

## 2017-01-28 MED ORDER — PENTAFLUOROPROP-TETRAFLUOROETH EX AERO: 1.0000 "application " | INHALATION_SPRAY | CUTANEOUS | Status: DC | PRN

## 2017-01-28 MED ORDER — VANCOMYCIN HCL IN DEXTROSE 750-5 MG/150ML-% IV SOLN
750.0000 mg | INTRAVENOUS | Status: DC
  Administered 2017-01-30 – 2017-02-02 (×2): 750 mg via INTRAVENOUS
  Filled 2017-01-28 (×2): qty 150

## 2017-01-28 MED ORDER — ALTEPLASE 2 MG IJ SOLR: 2.0000 mg | Freq: Once | INTRAMUSCULAR | Status: DC | PRN

## 2017-01-28 MED ORDER — HEPARIN SODIUM (PORCINE) 1000 UNIT/ML DIALYSIS: 1000.0000 [IU] | INTRAMUSCULAR | Status: DC | PRN

## 2017-01-28 MED ORDER — MIDODRINE HCL 5 MG PO TABS
ORAL_TABLET | ORAL | Status: AC
  Filled 2017-01-28: qty 2

## 2017-01-28 MED ORDER — LIDOCAINE-PRILOCAINE 2.5-2.5 % EX CREA
1.0000 "application " | TOPICAL_CREAM | CUTANEOUS | Status: DC | PRN
  Filled 2017-01-28: qty 5

## 2017-01-28 NOTE — Progress Notes (Signed)
PROGRESS NOTE  [name] James MQK:[id number] DOB: 1936/12/08 DOA: 01/19/2017 PCP: [name], MD  HPI/Recap of past 24 hours:  Pt is very frail but oriented x3, c/p chronic back pain, need to be turned frequently She returned from dialysis, not fluids pulled due to hypotension Sputum culture + MRSA, she is started on vanc and put on contact precaution Son at bedside  Assessment/Plan: Principal Problem:   Sepsis ([place]) Active Problems:   Essential hypertension   Anemia   ESRD (end stage renal disease) (HCC)   Paroxysmal atrial fibrillation ([place])   Controlled type 2 diabetes mellitus with diabetic polyneuropathy, with long-term current use of insulin ([place])   HCAP (healthcare-associated pneumonia)   Pressure ulcer  1. Sepsis from pneumonia:  Patient meets sepsis criteria on admission given tachypnea, leukocytosis, and evidence of organ dysfunction.  Source of infection left lower lobe pna and influenza A.   Sputum Culture + MRSA,  urine strep pneumo antigen +, legionella antigen negative   -she finished treatment with Oseltamivir renal dosing, - she received vanc/aztreonam/zithro on day of admission, abx changed to cefepime since admission, vanc was stopped due to mrsa screen negative, Pt transitioned to levaquin on 2/3, her sputum culture was collected on 2/3, +mrsa that is resistant to cipro, abx changed to vanc on 2/7.  2. H/o Hypertension: presented with hypotension/sepsis midodrine for intradialytic hypotension -patient presented with hypotension, sbp in 80's initial on presentation,  - home meds betablocker has been on hold since admission, bp still low , especially during dialysis   3. ESRD on HD MWF:  -Continue midodrine with dialysis - nephrology consulted and assisting - difficult to remove fluids due to hypotension during dialysis, nephrology recommended palliative care consult.A  4. Pressure ulcer, presented on admission -Stage 2 Pressure injury sacrum  with non blanchable redness over the buttocks.  presented on admission.  Patient is incontinent of stool, so the redness also can be related to MASD  Wound care input appreciated  5. Diabetes/hyperglycemia:  - currently on ssi  6. pAF:  CHADS2Vasc 7.  Not on warfarin because of risk of bleeding. -Continue amiodarone -Continue full aspirin  7. Other medications:  -Continue allopurinol and PPI  8. Anemia: hgb 7.6, no sign of bleeding, transfuse if hgb less than 7  9. Chronic diastolic CHF: -Strict I/Os - volume control with dialysis, but difficult to pull fluids off during dialysis  10. Elevated LFTs: No abdominal pain or nausea.  Presumably from sepsis/influenza/fluids overload, congestion. -Trend LFTs , u/s reviewed. -   11. Hypokalemia - monitor, replace prn   FTT: baseline bed to wheelchair bound, need lift for transfer for the last two years  DVT prophylaxis: Lovenox    Code Status: full  Family Communication: patient and son at bedside   Disposition Plan: very weak, blood pressure to low to remove fluids during dialysis, poor prognosis, goal of care discussion, palliative care team consulted   Consultants:  nephrology  Procedures:  Dialysis MWF  Antibiotics:  Tamiflu Vanc/aztreonam/zithromax in the ED, cefepime than levaquin then vanc  Objective: BP (!) 116/54 (BP Location: Right Arm)   Pulse 78   Temp 98.3 F (36.8 C) (Oral)   Resp (!) 21   Ht 5\' 3"  (1.6 m)   Wt 74.8 kg (164 lb 14.5 oz)   SpO2 100%   BMI 29.21 kg/m   Intake/Output Summary (Last 24 hours) at 01/28/17 1737 Last data filed at 01/28/17 1335  Gross per 24 hour  Intake  220 ml  Output                0 ml  Net              220 ml   Filed Weights   01/27/17 0500 01/28/17 0841 01/28/17 1335  Weight: 74 kg (163 lb 3.2 oz) 74.8 kg (164 lb 14.5 oz) 74.8 kg (164 lb 14.5 oz)    Exam:   General:  Very Frail, pale, alert and awake  Cardiovascular: RRR, no  rubs  Respiratory: decreased breath sounds, +rhonchi, crackles, left lower lobe. Better aeration right lung. No wheezing  Abdomen: Soft/ND/NT, positive BS  Musculoskeletal: no edema, Chronic foot drop, more on the left  Neuro: aaox3  Data Reviewed: Basic Metabolic Panel:  Recent Labs Lab 01/22/17 0656 01/23/17 0732 01/25/17 0446 01/26/17 0915 01/28/17 0331 01/28/17 0900  NA 137 135 134* 134* 135 133*  K 4.0 4.2 3.5 3.8 3.5 3.5  CL 102 102 101 99* 96* 97*  CO2 30 27 28 27 29 28   GLUCOSE 67 91 128* 118* 117* 122*  BUN 5* 11 16 28* 16 11  CREATININE 1.65* 2.61* 2.58* 3.42* 2.92* 1.99*  CALCIUM 7.1* 7.3* 7.4* 7.6* 7.3* 7.2*  PHOS 1.4* 1.8*  --  1.7*  --  1.3*   Liver Function Tests:  Recent Labs Lab 01/23/17 0732 01/25/17 0446 01/26/17 0915 01/28/17 0331 01/28/17 0900  AST  --  139*  --  142*  --   ALT  --  98*  --  108*  --   ALKPHOS  --  90  --  102  --   BILITOT  --  0.8  --  1.0  --   PROT  --  5.1*  --  5.1*  --   ALBUMIN 1.5* 1.6* 1.6* 1.5* 1.5*   No results for input(s): LIPASE, AMYLASE in the last 168 hours. No results for input(s): AMMONIA in the last 168 hours. CBC:  Recent Labs Lab 01/23/17 0732 01/26/17 0915 01/28/17 0900  WBC 7.4 8.0 7.2  HGB 9.2* 7.8* 7.6*  HCT 30.3* 24.7* 23.8*  MCV 99.0 97.2 96.7  PLT 119* 138* 167   Cardiac Enzymes:   No results for input(s): CKTOTAL, CKMB, CKMBINDEX, TROPONINI in the last 168 hours. BNP (last 3 results)  Recent Labs  01/20/17 0634 01/25/17 1742  BNP 451.4* 214.8*    ProBNP (last 3 results) No results for input(s): PROBNP in the last 8760 hours.  CBG:  Recent Labs Lab 01/27/17 1658 01/27/17 2154 01/28/17 0758 01/28/17 1527 01/28/17 1643  GLUCAP 193* 135* 113* 76 121*    Recent Results (from the past 240 hour(s))  Culture, blood (Routine x 2)     Status: None   Collection Time: 01/19/17  5:22 PM  Result Value Ref Range Status   Specimen Description BLOOD RIGHT ANTECUBITAL  Final     Special Requests BOTTLES DRAWN AEROBIC AND ANAEROBIC 5CC  Final   Culture NO GROWTH 5 DAYS  Final   Report Status 01/24/2017 FINAL  Final  MRSA PCR Screening     Status: None   Collection Time: 01/20/17 12:10 PM  Result Value Ref Range Status   MRSA by PCR NEGATIVE NEGATIVE Final    Comment:        The GeneXpert MRSA Assay (FDA approved for NASAL specimens only), is one component of a comprehensive MRSA colonization surveillance program. It is not intended to diagnose MRSA infection nor to guide or monitor treatment  for MRSA infections.   Culture, sputum-assessment     Status: None   Collection Time: 01/20/17 10:31 PM  Result Value Ref Range Status   Specimen Description EXPECTORATED SPUTUM  Final   Special Requests NONE  Final   Sputum evaluation   Final    Sputum specimen not acceptable for testing.  Please recollect.   Gram Stain Report Called to,Read Back By and Verified With: MILLER,RN @0132  01/21/17 MKELLY,MLT    Report Status 01/21/2017 FINAL  Final  Culture, expectorated sputum-assessment     Status: None   Collection Time: 01/24/17  2:23 PM  Result Value Ref Range Status   Specimen Description EXPECTORATED SPUTUM  Final   Special Requests NONE  Final   Sputum evaluation THIS SPECIMEN IS ACCEPTABLE FOR SPUTUM CULTURE  Final   Report Status 01/24/2017 FINAL  Final  Culture, respiratory (NON-Expectorated)     Status: None   Collection Time: 01/24/17  2:23 PM  Result Value Ref Range Status   Specimen Description EXPECTORATED SPUTUM  Final   Special Requests NONE Reflexed from [id number]  Final   Gram Stain   Final    ABUNDANT WBC PRESENT, PREDOMINANTLY PMN MODERATE GRAM POSITIVE COCCI IN PAIRS MODERATE GRAM POSITIVE COCCI IN CHAINS FEW GRAM NEGATIVE RODS FEW YEAST    Culture   Final    MODERATE METHICILLIN RESISTANT STAPHYLOCOCCUS AUREUS   Report Status 01/27/2017 FINAL  Final   Organism ID, Bacteria METHICILLIN RESISTANT STAPHYLOCOCCUS AUREUS  Final       Susceptibility   Methicillin resistant staphylococcus aureus - MIC*    CIPROFLOXACIN >=8 RESISTANT Resistant     ERYTHROMYCIN >=8 RESISTANT Resistant     GENTAMICIN <=0.5 SENSITIVE Sensitive     OXACILLIN >=4 RESISTANT Resistant     TETRACYCLINE <=1 SENSITIVE Sensitive     VANCOMYCIN <=0.5 SENSITIVE Sensitive     TRIMETH/SULFA >=320 RESISTANT Resistant     CLINDAMYCIN <=0.25 SENSITIVE Sensitive     RIFAMPIN <=0.5 SENSITIVE Sensitive     Inducible Clindamycin NEGATIVE Sensitive     * MODERATE METHICILLIN RESISTANT STAPHYLOCOCCUS AUREUS     Studies: No results found.  Scheduled Meds: . allopurinol  100 mg Oral Daily  . amiodarone  200 mg Oral Daily  . aspirin  325 mg Oral Daily  . darbepoetin (ARANESP) injection - DIALYSIS  60 mcg Intravenous Q Fri-HD  . feeding supplement  1 Container Oral TID BM  . guaiFENesin  600 mg Oral BID  . heparin  5,000 Units Subcutaneous Q8H  . insulin aspart  0-5 Units Subcutaneous QHS  . insulin aspart  0-9 Units Subcutaneous TID WC  . mouth rinse  15 mL Mouth Rinse BID  . megestrol  200 mg Oral Daily  . midodrine  10 mg Oral Q M,W,F-HD  . multivitamin  1 tablet Oral QHS  . pantoprazole  40 mg Oral Daily  . [START ON 01/30/2017] vancomycin  750 mg Intravenous Q M,W,F-HD    Continuous Infusions:   Time spent: 10mins  Eoin Willden MD, PhD  [facility] Pager [phone] If 7PM-7AM, please contact night-coverage at [email or web address], password [facility] 01/28/2017, 5:37 PM  LOS: 9 days

## 2017-01-28 NOTE — Progress Notes (Signed)
[name] Progress Note   Subjective: still very tired and weak. Not tolerating much UF on HD.   Vitals:   01/28/17 0930 01/28/17 1000 01/28/17 1030 01/28/17 1100  BP: (!) 107/43 (!) 103/44 (!) 108/42 (!) 83/40  Pulse: 81 83 84 80  Resp: 20 20 (!) 21 19  Temp:      TempSrc:      SpO2:    100%  Weight:      Height:        Inpatient medications: . allopurinol  100 mg Oral Daily  . amiodarone  200 mg Oral Daily  . aspirin  325 mg Oral Daily  . darbepoetin (ARANESP) injection - DIALYSIS  60 mcg Intravenous Q Fri-HD  . dextromethorphan-guaiFENesin  1 tablet Oral BID  . feeding supplement  1 Container Oral TID BM  . [START ON 01/29/2017] heparin  3,000 Units Dialysis Once in dialysis  . heparin  5,000 Units Subcutaneous Q8H  . insulin aspart  0-5 Units Subcutaneous QHS  . insulin aspart  0-9 Units Subcutaneous TID WC  . mouth rinse  15 mL Mouth Rinse BID  . megestrol  200 mg Oral Daily  . midodrine  10 mg Oral Q M,W,F-HD  . multivitamin  1 tablet Oral QHS  . pantoprazole  40 mg Oral Daily    sodium chloride, sodium chloride, acetaminophen **OR** acetaminophen, albuterol, alteplase, diazepam, guaiFENesin-dextromethorphan, heparin, lidocaine (PF), lidocaine-prilocaine, ondansetron **OR** ondansetron (ZOFRAN) IV, pentafluoroprop-tetrafluoroeth  Exam: Wet cough, frail elderly WF no distress No jvd Chest coarse rhonchi bilat, no ^wob RRR no mrg Abd soft ntnd +bs No LE edema LUE AVG+bruit Neuro gen'd weakness, minimal LE motor strength (chronic)  Dialysis: MWF AF 4h  72.5kg   2/2.25 bath  Hep 3300  LUA AVG No meds Labs: hb 11 tfs 22%pth 180  P 3 Access Hx - PTA 80 - 90% outflo ax vein stenosis and 70% venous stenosis 12/19/17- post procedure AF 600s which dropped to 300s twice in January (this was her pre procedure AF range- kinetics however great)     Assessment: 1. PNA/ sepsis/ flu A+ - sp 5d course IV abx for HCAP. BCx neg 2. ESRD MWF HD. HD today, min  UF, BP's soft 3. Volume - no gross excess 4. Hypotension - chronic on midodrine 5. Anemia - Hb down 7.8, started ESA here darbe 60/wk. Hb 11 as OP. Fe/ TIBC ordered 6. MBD binder on hold for low P 7. Nutrition - low alb 1.6.  Regular diet, vitamins, etc.   8. ^LFT's - Hep panel neg, improving 9. Chronic debility/ FTT - living in SNF 3 yrs , WC/bedbound, requires assist for all transfers. Prognosis poor. Consulted PC team to setup family meeting, family wants to talk re EOL issues.     Kelly Splinter MD [facility] pager [phone]   01/28/2017, 11:15 AM    Recent Labs Lab 01/23/17 0732  01/26/17 0915 01/28/17 0331 01/28/17 0900  NA 135  < > 134* 135 133*  K 4.2  < > 3.8 3.5 3.5  CL 102  < > 99* 96* 97*  CO2 27  < > 27 29 28   GLUCOSE 91  < > 118* 117* 122*  BUN 11  < > 28* 16 11  CREATININE 2.61*  < > 3.42* 2.92* 1.99*  CALCIUM 7.3*  < > 7.6* 7.3* 7.2*  PHOS 1.8*  --  1.7*  --  1.3*  < > = values in this interval not displayed.  Recent Labs Lab 01/25/17 0446 01/26/17 0915 01/28/17 0331 01/28/17 0900  AST 139*  --  142*  --   ALT 98*  --  108*  --   ALKPHOS 90  --  102  --   BILITOT 0.8  --  1.0  --   PROT 5.1*  --  5.1*  --   ALBUMIN 1.6* 1.6* 1.5* 1.5*    Recent Labs Lab 01/23/17 0732 01/26/17 0915 01/28/17 0900  WBC 7.4 8.0 7.2  HGB 9.2* 7.8* 7.6*  HCT 30.3* 24.7* 23.8*  MCV 99.0 97.2 96.7  PLT 119* 138* 167   Iron/TIBC/Ferritin/ %Sat    Component Value Date/Time   IRON 59 01/26/2017 1515   TIBC NOT CALCULATED 01/26/2017 1515   FERRITIN 29 10/27/2014 0435   IRONPCTSAT NOT CALCULATED 01/26/2017 1515   IRONPCTSAT 22 09/06/2008 1552

## 2017-01-29 DIAGNOSIS — Z7189 Other specified counseling: Secondary | ICD-10-CM

## 2017-01-29 DIAGNOSIS — A4101 Sepsis due to Methicillin susceptible Staphylococcus aureus: Secondary | ICD-10-CM

## 2017-01-29 DIAGNOSIS — Z515 Encounter for palliative care: Secondary | ICD-10-CM

## 2017-01-29 LAB — CBC
HEMATOCRIT: 25.2 % — AB (ref 36.0–46.0)
HEMOGLOBIN: 8 g/dL — AB (ref 12.0–15.0)
MCH: 31 pg (ref 26.0–34.0)
MCHC: 31.7 g/dL (ref 30.0–36.0)
MCV: 97.7 fL (ref 78.0–100.0)
Platelets: 182 10*3/uL (ref 150–400)
RBC: 2.58 MIL/uL — AB (ref 3.87–5.11)
RDW: 19.4 % — ABNORMAL HIGH (ref 11.5–15.5)
WBC: 8.2 10*3/uL (ref 4.0–10.5)

## 2017-01-29 LAB — COMPREHENSIVE METABOLIC PANEL
ALBUMIN: 1.5 g/dL — AB (ref 3.5–5.0)
ALK PHOS: 105 U/L (ref 38–126)
ALT: 113 U/L — AB (ref 14–54)
AST: 145 U/L — ABNORMAL HIGH (ref 15–41)
Anion gap: 8 (ref 5–15)
BILIRUBIN TOTAL: 0.8 mg/dL (ref 0.3–1.2)
BUN: 6 mg/dL (ref 6–20)
CALCIUM: 7.3 mg/dL — AB (ref 8.9–10.3)
CO2: 30 mmol/L (ref 22–32)
CREATININE: 1.45 mg/dL — AB (ref 0.44–1.00)
Chloride: 96 mmol/L — ABNORMAL LOW (ref 101–111)
GFR calc Af Amer: 39 mL/min — ABNORMAL LOW (ref >60)
GFR calc non Af Amer: 33 mL/min — ABNORMAL LOW (ref >60)
GLUCOSE: 84 mg/dL (ref 65–99)
Potassium: 3.8 mmol/L (ref 3.5–5.1)
SODIUM: 134 mmol/L — AB (ref 135–145)
TOTAL PROTEIN: 5.2 g/dL — AB (ref 6.5–8.1)

## 2017-01-29 LAB — GLUCOSE, CAPILLARY
GLUCOSE-CAPILLARY: 104 mg/dL — AB (ref 65–99)
GLUCOSE-CAPILLARY: 116 mg/dL — AB (ref 65–99)
Glucose-Capillary: 78 mg/dL (ref 65–99)
Glucose-Capillary: 140 mg/dL — ABNORMAL HIGH (ref 65–99)

## 2017-01-29 LAB — CK: Total CK: 996 U/L — ABNORMAL HIGH (ref 38–234)

## 2017-01-29 NOTE — Progress Notes (Signed)
Pharmacy Antibiotic Note [name] is a 81 y.o. female admitted on 01/19/2017 with MRSA PNA in setting of ESRD. Has completed course of antibiotics for positive strep pna urine antigen and antivirals for positive influenza PCR. Pharmacy has been consulted for vancomycin dosing.  Plan: 1. Vancomycin 1750 mg IV given post IHD on 2/7 and will continue with 750 mg IV post IHD on Monday, Wednesday and Friday stating on 2/9  Height: 5\' 3"  (160 cm) Weight: 162 lb 1.6 oz (73.5 kg) IBW/kg (Calculated) : 52.4  Temp (24hrs), Avg:98.2 F (36.8 C), Min:97.9 F (36.6 C), Max:98.4 F (36.9 C)   Recent Labs Lab 01/23/17 0732 01/25/17 0446 01/26/17 0915 01/28/17 0331 01/28/17 0900 01/29/17 0357  WBC 7.4  --  8.0  --  7.2 8.2  CREATININE 2.61* 2.58* 3.42* 2.92* 1.99* 1.45*    Estimated Creatinine Clearance: 30.2 mL/min (by C-G formula based on SCr of 1.45 mg/dL (H)).    Allergies  Allergen Reactions  . Erythromycin Swelling  . Erythromycin Base     Other reaction(s): GI Upset (intolerance)  . Penicillins Other (See Comments)    Unknown allergic reaction.  She has taken keflex and Rocephin many times without problems  . Codeine Rash and Palpitations    Other reaction(s): GI Upset (intolerance)  . Penicillin G Rash   Antimicrobials this admission:  Azith 1/29 x1 Aztreonam 1/29 x1 Vanc 1/29>>2/2; 2/7>> Tamiflu 1/29>>2/5 Cefepime 1/30>>2/2 LVQ 2/2 >>2/6  Microbiology results:  1/29 BCx: ngtd 1/29 Flu A PCR: pos  1/30 MRSA PCR: neg 1/31 sputum: neg 1/31 strep urine + 2/3 Resp cxr: MRSA   Vincenza Hews, PharmD, BCPS 01/29/2017, 10:04 AM   '

## 2017-01-29 NOTE — Consult Note (Signed)
Consultation Note Date: 01/29/2017   Patient Name: [name]  DOB: 05-10-1936  MRN: [id number]  Age / Sex: 81 y.o., female  PCP: [name], MD Referring Physician: Florencia Reasons, MD  Reason for Consultation: Establishing goals of care  HPI/Patient Profile: 81 y.o. female  with past medical history of ESRD (on HD MWF), DM, HTN, Afib  admitted on 01/19/2017 with HCAP. Further workup reveals influenza A positive, pneumococcal pneumonia, MRSA + sputum, sepsis. Palliative medicine consulted for [place].    Clinical Assessment and Goals of Care:  Had very protracted discussion with patient's daughter and son. They did not want discussion to be held in front of their mother, although they both acknowledge their mother is competent in making her own decisions.  Discussed palliative medicine purpose and advance healthcare directives. Discussed patient prognosis (poor considering ESRD, not tolerating HD d/t hypotension, poor level of functioning- wheelchair bound, poor nutritional status-  Albumin 1.5, infectious processes). Answered their questions regarding Hospice care. Discussed their expectations for mother's experience at end of life. Both agreed that they do not want their mother to suffer, however, there is conflict in how to best meet this goal. Patient's daughter is convinced that a full code status will prevent her mother from "struggling to breath" at end of life, patient will be intubated and then extubated comfortably. I did present the reality of code situations and extubations. Discussed alternative options of comfort care at end of life and what that would look like.  Patient's daughter is very clear in her desire for full code status and full scope of care, however, was open to discussion and receiving information. Patient's son was also very receptive during conversation.  All agreed that patient should be  involved in decisions about her care going forward, including a discussion about her code status and other advance directives. Consensus made to continue full scope of care and reconvene with patient involved. Family requested this meeting take place "in a few days" with them present, as they feel their mother is not mentally up to this conversation, and I will respect this request. However, will continue to follow and should patient further decline will intervene earlier.   Primary Decision Maker PATIENT  SUMMARY OF RECOMMENDATIONS  -Full scope of care -F/U meeting to be held with patient present in the future -PMT will follow and intervene sooner if patient declines further    Code Status/Advance Care Planning:  Full code   Palliative Prophylaxis:   Turn Reposition  Additional Recommendations (Limitations, Scope, Preferences):  Full Scope Treatment  Psycho-social/Spiritual:   Desire for further Chaplaincy support:No   Prognosis:    < 6 months d/t ESRD, albumin 1.5 indicating high chance of mortality  Discharge Planning: To Be Determined  Primary Diagnoses: Present on Admission: . Sepsis ([place]) . Essential hypertension . Anemia . ESRD (end stage renal disease) ([place]) . HCAP (healthcare-associated pneumonia) . Pressure ulcer . Paroxysmal atrial fibrillation ([facility])   I have reviewed the medical record, interviewed the patient and family, and examined  the patient. The following aspects are pertinent.   PRN Meds:.acetaminophen **OR** acetaminophen, albuterol, diazepam, guaiFENesin-dextromethorphan, ondansetron **OR** ondansetron (ZOFRAN) IV Medications Prior to Admission:   Allergies  Allergen Reactions  . Erythromycin Swelling  . Erythromycin Base     Other reaction(s): GI Upset (intolerance)  . Penicillins Other (See Comments)    Unknown allergic reaction.  She has taken keflex and Rocephin many times without problems  . Codeine Rash and Palpitations    Other  reaction(s): GI Upset (intolerance)  . Penicillin G Rash   Review of Systems  Unable to perform ROS   Physical Exam deferred  Vital Signs: BP (!) 139/46   Pulse 79   Temp 98 F (36.7 C) (Oral)   Resp (!) 22   Ht 5\' 3"  (1.6 m)   Wt 73.5 kg (162 lb 1.6 oz)   SpO2 100%   BMI 28.71 kg/m  Pain Assessment: No/denies pain POSS *See Group Information*: 1-Acceptable,Awake and alert Pain Score: 5    SpO2: SpO2: 100 % O2 Device:SpO2: 100 % O2 Flow Rate: .O2 Flow Rate (L/min): 2 L/min  LBM: Last BM Date: 01/28/17 Baseline Weight: Weight: 73.4 kg (161 lb 13.1 oz) Most recent weight: Weight: 73.5 kg (162 lb 1.6 oz)     Palliative Assessment/Data: PPS: 30%    Thank you for this consult. Palliative medicine will continue to follow and assist as needed.   Time In:0900 Time Out: 1115 Time Total:135 minutes Prolonged services billed: Yes  Greater than 50%  of this time was spent counseling and coordinating care related to the above assessment and plan.  Signed by: Mariana Kaufman, AGNP-C Palliative Medicine    Please contact Palliative Medicine Team phone at [phone] for questions and concerns.  For individual provider: See Shea Evans

## 2017-01-29 NOTE — Progress Notes (Signed)
[facility] Progress Note   Subjective: still very tired and weak, wet cough  Vitals:   01/29/17 0354 01/29/17 0355 01/29/17 0749 01/29/17 0754  BP: 120/86 120/86  (!) 139/46  Pulse: 75 80  79  Resp: 18 16  (!) 22  Temp: 98.4 F (36.9 C)   98 F (36.7 C)  TempSrc: Oral  Oral Oral  SpO2: 100% 97%  100%  Weight:  73.5 kg (162 lb 1.6 oz)    Height:        Inpatient medications: . allopurinol  100 mg Oral Daily  . amiodarone  200 mg Oral Daily  . aspirin  325 mg Oral Daily  . darbepoetin (ARANESP) injection - DIALYSIS  60 mcg Intravenous Q Fri-HD  . feeding supplement  1 Container Oral TID BM  . guaiFENesin  600 mg Oral BID  . heparin  5,000 Units Subcutaneous Q8H  . insulin aspart  0-5 Units Subcutaneous QHS  . insulin aspart  0-9 Units Subcutaneous TID WC  . mouth rinse  15 mL Mouth Rinse BID  . megestrol  200 mg Oral Daily  . midodrine  10 mg Oral Q M,W,F-HD  . multivitamin  1 tablet Oral QHS  . pantoprazole  40 mg Oral Daily  . [START ON 01/30/2017] vancomycin  750 mg Intravenous Q M,W,F-HD    acetaminophen **OR** acetaminophen, albuterol, diazepam, guaiFENesin-dextromethorphan, ondansetron **OR** ondansetron (ZOFRAN) IV  Exam: Wet cough, frail elderly WF no distress, wet cough still No jvd Chest coarse rhonchi bilat, no ^wob RRR no mrg Abd soft ntnd +bs No LE edema LUE AVG+bruit Neuro gen'd weakness, minimal LE motor strength (chronic)  Dialysis: MWF AF 4h  72.5kg   2/2.25 bath  Hep 3300  LUA AVG No meds Labs: hb 11 tfs 22%pth 180  P 3 Access Hx - PTA 80 - 90% outflo ax vein stenosis and 70% venous stenosis 12/19/17- post procedure AF 600s which dropped to 300s twice in January (this was her pre procedure AF range- kinetics however great)     Assessment: 1. PNA/ sepsis/ flu A+ - sp 5d course IV abx for HCAP. BCx neg 2. ESRD MWF HD 3. Volume - no gross excess 4. Hypotension - chronic on midodrine 5. Anemia - Hb down 7.8, started ESA here  darbe 60/wk. Hb 11 as OP. Fe/ TIBC ordered 6. MBD binder on hold for low P 7. Nutrition - low alb 1.6.  Regular diet, vitamins, etc.   8. ^LFT's - Hep panel neg, improving 9. Chronic debility/ FTT - living in SNF 3 yrs , WC/bedbound, requires assist for all transfers. Prognosis guarded. Requested pall care consult.   Plan - HD Friday, min UF  Kelly Splinter MD [facility] pager [phone]   01/29/2017, 9:27 AM    Recent Labs Lab 01/23/17 0732  01/26/17 0915 01/28/17 0331 01/28/17 0900 01/29/17 0357  NA 135  < > 134* 135 133* 134*  K 4.2  < > 3.8 3.5 3.5 3.8  CL 102  < > 99* 96* 97* 96*  CO2 27  < > 27 29 28 30   GLUCOSE 91  < > 118* 117* 122* 84  BUN 11  < > 28* 16 11 6   CREATININE 2.61*  < > 3.42* 2.92* 1.99* 1.45*  CALCIUM 7.3*  < > 7.6* 7.3* 7.2* 7.3*  PHOS 1.8*  --  1.7*  --  1.3*  --   < > = values in this interval not displayed.  Recent Labs  Lab 01/25/17 0446  01/28/17 0331 01/28/17 0900 01/29/17 0357  AST 139*  --  142*  --  145*  ALT 98*  --  108*  --  113*  ALKPHOS 90  --  102  --  105  BILITOT 0.8  --  1.0  --  0.8  PROT 5.1*  --  5.1*  --  5.2*  ALBUMIN 1.6*  < > 1.5* 1.5* 1.5*  < > = values in this interval not displayed.  Recent Labs Lab 01/26/17 0915 01/28/17 0900 01/29/17 0357  WBC 8.0 7.2 8.2  HGB 7.8* 7.6* 8.0*  HCT 24.7* 23.8* 25.2*  MCV 97.2 96.7 97.7  PLT 138* 167 182   Iron/TIBC/Ferritin/ %Sat    Component Value Date/Time   IRON 59 01/26/2017 1515   TIBC NOT CALCULATED 01/26/2017 1515   FERRITIN 29 10/27/2014 0435   IRONPCTSAT NOT CALCULATED 01/26/2017 1515   IRONPCTSAT 22 09/06/2008 1552

## 2017-01-29 NOTE — Progress Notes (Signed)
PROGRESS NOTE  MALETA PACHA GLO:[id number] DOB: Jan 26, 1936 DOA: 01/19/2017 PCP: [name], MD  HPI/Recap of past 24 hours:  Pt is very frail but oriented x3, c/p chronic back pain, need to be turned frequently Daughter at bedside  Assessment/Plan: Principal Problem:   Sepsis ([place]) Active Problems:   Essential hypertension   Anemia   ESRD (end stage renal disease) ([place])   Paroxysmal atrial fibrillation ([place])   Controlled type 2 diabetes mellitus with diabetic polyneuropathy, with long-term current use of insulin ([place])   HCAP (healthcare-associated pneumonia)   Pressure ulcer  1. Sepsis from pneumonia:  Patient meets sepsis criteria on admission given tachypnea, leukocytosis, and evidence of organ dysfunction.  Source of infection left lower lobe pna and influenza A.   Sputum Culture + MRSA,  urine strep pneumo antigen +, legionella antigen negative   -she finished treatment with Oseltamivir renal dosing, - she received vanc/aztreonam/zithro on day of admission, abx changed to cefepime since admission, vanc was stopped due to mrsa screen negative, Pt transitioned to levaquin on 2/3, her sputum culture was collected on 2/3, +mrsa that is resistant to cipro, abx changed to vanc on 2/7.  2. H/o Hypertension: presented with hypotension/sepsis midodrine for intradialytic hypotension -patient presented with hypotension, sbp in 80's initial on presentation,  - home meds betablocker has been on hold since admission, bp still low , especially during dialysis   3. ESRD on HD MWF:  -Continue midodrine with dialysis - nephrology consulted and assisting - difficult to remove fluids due to hypotension during dialysis, nephrology recommended palliative care consult.  4. Pressure ulcer, presented on admission -Stage 2 Pressure injury sacrum with non blanchable redness over the buttocks.  presented on admission.  Patient is incontinent of stool, so the redness also can be related  to MASD  Wound care input appreciated  5. Diabetes/hyperglycemia:  - currently on ssi  6. pAF:  CHADS2Vasc 7.  Not on warfarin because of risk of bleeding. -Continue amiodarone -Continue full aspirin  7. Other medications:  -Continue allopurinol and PPI  8. Anemia: hgb 7.6, no sign of bleeding, transfuse if hgb less than 7  9. Chronic diastolic CHF: -Strict I/Os - volume control with dialysis, but difficult to pull fluids off during dialysis  10. Elevated LFTs: No abdominal pain or nausea.  Presumably from sepsis/influenza/fluids overload, congestion. She has also been on amiodarone chronically -Trend LFTs , u/s reviewed. -I have discussed this findings with the daughter and patient , they prefer to continue amiodarone for now  27. Hypokalemia - monitor, replace prn   FTT: baseline bed to wheelchair bound, need lift for transfer for the last two years  DVT prophylaxis: Lovenox   Code Status: full  Family Communication: patient and daugher at bedside   Disposition Plan: very weak, blood pressure to low to remove fluids during dialysis, poor prognosis, goal of care discussion, palliative care team consulted   Consultants:  nephrology  Procedures:  Dialysis MWF  Antibiotics:  Tamiflu Vanc/aztreonam/zithromax in the ED, cefepime than levaquin then vanc  Objective: BP (!) 139/46   Pulse 79   Temp 98 F (36.7 C) (Oral)   Resp (!) 22   Ht 5\' 3"  (1.6 m)   Wt 73.5 kg (162 lb 1.6 oz)   SpO2 100%   BMI 28.71 kg/m   Intake/Output Summary (Last 24 hours) at 01/29/17 0837 Last data filed at 01/28/17 1846  Gross per 24 hour  Intake  180 ml  Output                0 ml  Net              180 ml   Filed Weights   01/28/17 0841 01/28/17 1335 01/29/17 0355  Weight: 74.8 kg (164 lb 14.5 oz) 74.8 kg (164 lb 14.5 oz) 73.5 kg (162 lb 1.6 oz)    Exam:   General:  Very Frail, pale, alert and awake  Cardiovascular: RRR, no  rubs  Respiratory: decreased breath sounds, +rhonchi, crackles, left lower lobe. Better aeration right lung. No wheezing  Abdomen: Soft/ND/NT, positive BS  Musculoskeletal: no edema, Chronic foot drop, more on the left  Neuro: aaox3  Data Reviewed: Basic Metabolic Panel:  Recent Labs Lab 01/23/17 0732 01/25/17 0446 01/26/17 0915 01/28/17 0331 01/28/17 0900 01/29/17 0357  NA 135 134* 134* 135 133* 134*  K 4.2 3.5 3.8 3.5 3.5 3.8  CL 102 101 99* 96* 97* 96*  CO2 27 28 27 29 28 30   GLUCOSE 91 128* 118* 117* 122* 84  BUN 11 16 28* 16 11 6   CREATININE 2.61* 2.58* 3.42* 2.92* 1.99* 1.45*  CALCIUM 7.3* 7.4* 7.6* 7.3* 7.2* 7.3*  PHOS 1.8*  --  1.7*  --  1.3*  --    Liver Function Tests:  Recent Labs Lab 01/25/17 0446 01/26/17 0915 01/28/17 0331 01/28/17 0900 01/29/17 0357  AST 139*  --  142*  --  145*  ALT 98*  --  108*  --  113*  ALKPHOS 90  --  102  --  105  BILITOT 0.8  --  1.0  --  0.8  PROT 5.1*  --  5.1*  --  5.2*  ALBUMIN 1.6* 1.6* 1.5* 1.5* 1.5*   No results for input(s): LIPASE, AMYLASE in the last 168 hours. No results for input(s): AMMONIA in the last 168 hours. CBC:  Recent Labs Lab 01/23/17 0732 01/26/17 0915 01/28/17 0900 01/29/17 0357  WBC 7.4 8.0 7.2 8.2  HGB 9.2* 7.8* 7.6* 8.0*  HCT 30.3* 24.7* 23.8* 25.2*  MCV 99.0 97.2 96.7 97.7  PLT 119* 138* 167 182   Cardiac Enzymes:    Recent Labs Lab 01/29/17 0357  CKTOTAL 996*   BNP (last 3 results)  Recent Labs  01/20/17 0634 01/25/17 1742  BNP 451.4* 214.8*    ProBNP (last 3 results) No results for input(s): PROBNP in the last 8760 hours.  CBG:  Recent Labs Lab 01/28/17 0758 01/28/17 1527 01/28/17 1643 01/28/17 2112 01/29/17 0752  GLUCAP 113* 76 121* 137* 78    Recent Results (from the past 240 hour(s))  Culture, blood (Routine x 2)     Status: None   Collection Time: 01/19/17  5:22 PM  Result Value Ref Range Status   Specimen Description BLOOD RIGHT ANTECUBITAL   Final   Special Requests BOTTLES DRAWN AEROBIC AND ANAEROBIC 5CC  Final   Culture NO GROWTH 5 DAYS  Final   Report Status 01/24/2017 FINAL  Final  MRSA PCR Screening     Status: None   Collection Time: 01/20/17 12:10 PM  Result Value Ref Range Status   MRSA by PCR NEGATIVE NEGATIVE Final    Comment:        The GeneXpert MRSA Assay (FDA approved for NASAL specimens only), is one component of a comprehensive MRSA colonization surveillance program. It is not intended to diagnose MRSA infection nor to guide or monitor treatment for MRSA infections.  Culture, sputum-assessment     Status: None   Collection Time: 01/20/17 10:31 PM  Result Value Ref Range Status   Specimen Description EXPECTORATED SPUTUM  Final   Special Requests NONE  Final   Sputum evaluation   Final    Sputum specimen not acceptable for testing.  Please recollect.   Gram Stain Report Called to,Read Back By and Verified With: MILLER,RN @0132  01/21/17 MKELLY,MLT    Report Status 01/21/2017 FINAL  Final  Culture, expectorated sputum-assessment     Status: None   Collection Time: 01/24/17  2:23 PM  Result Value Ref Range Status   Specimen Description EXPECTORATED SPUTUM  Final   Special Requests NONE  Final   Sputum evaluation THIS SPECIMEN IS ACCEPTABLE FOR SPUTUM CULTURE  Final   Report Status 01/24/2017 FINAL  Final  Culture, respiratory (NON-Expectorated)     Status: None   Collection Time: 01/24/17  2:23 PM  Result Value Ref Range Status   Specimen Description EXPECTORATED SPUTUM  Final   Special Requests NONE Reflexed from [id number]  Final   Gram Stain   Final    ABUNDANT WBC PRESENT, PREDOMINANTLY PMN MODERATE GRAM POSITIVE COCCI IN PAIRS MODERATE GRAM POSITIVE COCCI IN CHAINS FEW GRAM NEGATIVE RODS FEW YEAST    Culture   Final    MODERATE METHICILLIN RESISTANT STAPHYLOCOCCUS AUREUS   Report Status 01/27/2017 FINAL  Final   Organism ID, Bacteria METHICILLIN RESISTANT STAPHYLOCOCCUS AUREUS  Final       Susceptibility   Methicillin resistant staphylococcus aureus - MIC*    CIPROFLOXACIN >=8 RESISTANT Resistant     ERYTHROMYCIN >=8 RESISTANT Resistant     GENTAMICIN <=0.5 SENSITIVE Sensitive     OXACILLIN >=4 RESISTANT Resistant     TETRACYCLINE <=1 SENSITIVE Sensitive     VANCOMYCIN <=0.5 SENSITIVE Sensitive     TRIMETH/SULFA >=320 RESISTANT Resistant     CLINDAMYCIN <=0.25 SENSITIVE Sensitive     RIFAMPIN <=0.5 SENSITIVE Sensitive     Inducible Clindamycin NEGATIVE Sensitive     * MODERATE METHICILLIN RESISTANT STAPHYLOCOCCUS AUREUS     Studies: Ct Chest Wo Contrast  Result Date: 01/28/2017 CLINICAL DATA:  Pneumonia, CHF, nonsmoker EXAM: CT CHEST WITHOUT CONTRAST TECHNIQUE: Multidetector CT imaging of the chest was performed following the standard protocol without IV contrast. COMPARISON:  CXR from 01/25/2017 FINDINGS: Cardiovascular: Normal sized cardiac chambers. Aortic atherosclerosis without aneurysm. There is coronary arteriosclerosis. Trace anterior pericardial effusion or thickening. Normal branch pattern of the great vessels. Mediastinum/Nodes: No pathologically enlarged lymph nodes. Trachea is unremarkable. Thyroid gland demonstrates coarse calcification on the right and 9 mm hypodense nodule on the left. Lungs/Pleura: There are soft tissue densities in the distal left mainstem bronchus causing postobstructive atelectasis pulmonary consolidation of the left lung. Slight mediastinal shift to the left due to volume loss. Findings may be secondary to mucous impaction. Small left pleural effusion. 5 - 6 mm pulmonary nodular densities in the right middle lobe, series 201, image 55, 57, and 58. Patchy airspace disease in the right lower lobe consistent with pneumonia. Upper Abdomen: Bilateral breast implants with possible old intracapsular rupture on the left. Cholecystectomy. Cyst off the upper pole the right kidney measuring 1.9 cm. No adrenal mass. Musculoskeletal: Degenerative changes along  the dorsal spine. No acute or suspicious osseous abnormalities. IMPRESSION: 1. Soft tissue densities are noted in the left mainstem bronchus causing multilobar collapse of the left lung. This may be from mucous impaction. Endobronchial lesions are not entirely excluded but given that this is  a new recent finding, mucous plugging is believed more likely. Moderate left pleural effusion. 2. Right lower lobe pneumonia with atelectasis. 3. 5 - 6 mm nodular densities in the right middle lobe. Non-contrast chest CT at 3-6 months is recommended. If the nodules are stable at time of repeat CT, then future CT at 18-24 months (from today's scan) is considered optional for low-risk patients, but is recommended for high-risk patients. This recommendation follows the consensus statement: Guidelines for Management of Incidental Pulmonary Nodules Detected on CT Images: From the Fleischner Society 2017; Radiology 2017; 284:228-243. 4. Intracapsular rupture of the left breast implant. 5. Right upper pole renal cyst measuring 1.9 cm. Electronically Signed   By: Ashley Royalty M.D.   On: 01/28/2017 22:06    Scheduled Meds: . allopurinol  100 mg Oral Daily  . amiodarone  200 mg Oral Daily  . aspirin  325 mg Oral Daily  . darbepoetin (ARANESP) injection - DIALYSIS  60 mcg Intravenous Q Fri-HD  . feeding supplement  1 Container Oral TID BM  . guaiFENesin  600 mg Oral BID  . heparin  5,000 Units Subcutaneous Q8H  . insulin aspart  0-5 Units Subcutaneous QHS  . insulin aspart  0-9 Units Subcutaneous TID WC  . mouth rinse  15 mL Mouth Rinse BID  . megestrol  200 mg Oral Daily  . midodrine  10 mg Oral Q M,W,F-HD  . multivitamin  1 tablet Oral QHS  . pantoprazole  40 mg Oral Daily  . [START ON 01/30/2017] vancomycin  750 mg Intravenous Q M,W,F-HD    Continuous Infusions:   Time spent: 86mins  Dawnya Grams MD, PhD  [facility] Pager [phone] If 7PM-7AM, please contact night-coverage at [email or web address], password  [facility] 01/29/2017, 8:37 AM  LOS: 10 days

## 2017-01-30 ENCOUNTER — Inpatient Hospital Stay (HOSPITAL_COMMUNITY): Payer: Medicare Other

## 2017-01-30 DIAGNOSIS — R0902 Hypoxemia: Secondary | ICD-10-CM

## 2017-01-30 DIAGNOSIS — J189 Pneumonia, unspecified organism: Secondary | ICD-10-CM

## 2017-01-30 DIAGNOSIS — J9811 Atelectasis: Secondary | ICD-10-CM

## 2017-01-30 LAB — GLUCOSE, CAPILLARY
Glucose-Capillary: 85 mg/dL (ref 65–99)
Glucose-Capillary: 154 mg/dL — ABNORMAL HIGH (ref 65–99)
Glucose-Capillary: 186 mg/dL — ABNORMAL HIGH (ref 65–99)

## 2017-01-30 MED ORDER — ACETYLCYSTEINE 20 % IN SOLN
4.0000 mL | RESPIRATORY_TRACT | Status: DC
  Administered 2017-01-30 – 2017-01-31 (×8): 4 mL via RESPIRATORY_TRACT
  Filled 2017-01-30 (×14): qty 4

## 2017-01-30 MED ORDER — VANCOMYCIN HCL IN DEXTROSE 750-5 MG/150ML-% IV SOLN
INTRAVENOUS | Status: AC
  Administered 2017-01-30: 750 mg via INTRAVENOUS
  Filled 2017-01-30: qty 150

## 2017-01-30 MED ORDER — DARBEPOETIN ALFA 60 MCG/0.3ML IJ SOSY
PREFILLED_SYRINGE | INTRAMUSCULAR | Status: AC
  Administered 2017-01-30: 60 ug via INTRAVENOUS
  Filled 2017-01-30: qty 0.3

## 2017-01-30 MED ORDER — MIDODRINE HCL 5 MG PO TABS
ORAL_TABLET | ORAL | Status: AC
  Filled 2017-01-30: qty 2

## 2017-01-30 MED ORDER — ALBUTEROL SULFATE (2.5 MG/3ML) 0.083% IN NEBU
2.5000 mg | INHALATION_SOLUTION | RESPIRATORY_TRACT | Status: DC
  Administered 2017-01-30 – 2017-01-31 (×8): 2.5 mg via RESPIRATORY_TRACT
  Filled 2017-01-30 (×8): qty 3

## 2017-01-30 MED ORDER — SODIUM CHLORIDE 3 % IN NEBU
4.0000 mL | INHALATION_SOLUTION | Freq: Four times a day (QID) | RESPIRATORY_TRACT | Status: DC
Start: 2017-01-30 — End: 2017-01-31
  Administered 2017-01-30 – 2017-01-31 (×6): 4 mL via RESPIRATORY_TRACT
  Filled 2017-01-30 (×10): qty 4

## 2017-01-30 NOTE — Progress Notes (Signed)
[facility] Progress Note   Subjective: still very tired and weak  Vitals:   01/30/17 1130 01/30/17 1200 01/30/17 1230 01/30/17 1300  BP: (!) 112/45 (!) 104/32 (!) 128/42 (!) 105/38  Pulse: 98 92 98 91  Resp:      Temp:      TempSrc:      SpO2:      Weight:      Height:        Inpatient medications: . acetylcysteine  4 mL Nebulization Q4H  . albuterol  2.5 mg Nebulization Q4H  . allopurinol  100 mg Oral Daily  . amiodarone  200 mg Oral Daily  . aspirin  325 mg Oral Daily  . darbepoetin (ARANESP) injection - DIALYSIS  60 mcg Intravenous Q Fri-HD  . feeding supplement  1 Container Oral TID BM  . guaiFENesin  600 mg Oral BID  . heparin  5,000 Units Subcutaneous Q8H  . insulin aspart  0-5 Units Subcutaneous QHS  . insulin aspart  0-9 Units Subcutaneous TID WC  . mouth rinse  15 mL Mouth Rinse BID  . megestrol  200 mg Oral Daily  . midodrine      . midodrine  10 mg Oral Q M,W,F-HD  . multivitamin  1 tablet Oral QHS  . pantoprazole  40 mg Oral Daily  . sodium chloride HYPERTONIC  4 mL Nebulization Q6H  . vancomycin  750 mg Intravenous Q M,W,F-HD    acetaminophen **OR** acetaminophen, albuterol, diazepam, guaiFENesin-dextromethorphan, ondansetron **OR** ondansetron (ZOFRAN) IV  Exam: Frail, elderly WF, looks a bit better , stronger today No jvd Chest coarse rhonchi bilat, no ^wob RRR no mrg Abd soft ntnd +bs No LE edema LUE AVG+bruit Neuro gen'd weakness, minimal LE motor strength (chronic)  Dialysis: MWF AF 4h  72.5kg   2/2.25 bath  Hep 3300  LUA AVG No meds Labs: hb 11 tfs 22%pth 180  P 3 Access Hx - PTA 80 - 90% outflo ax vein stenosis and 70% venous stenosis 12/19/17- post procedure AF 600s which dropped to 300s twice in January (this was her pre procedure AF range- kinetics however great)     Assessment: 1. PNA/ sepsis/ flu A+ - sp 5d course IV abx for HCAP. BCx neg 2. ESRD MWF HD 3. Volume - no gross excess 4. Hypotension - chronic on  midodrine 5. Anemia - Hb down 7.8, started ESA here darbe 60/wk. Hb 11 as OP. Fe/ TIBC ordered 6. MBD binder on hold for low P 7. Nutrition - low alb 1.6.  Regular diet, vitamins, etc.   8. ^LFT's - Hep panel neg, improving 9. Chronic debility/ FTT - living in SNF 3 yrs , WC/bedbound, requires assist for all transfers.    Plan - HD Friday, min UF  Kelly Splinter MD [facility] pager [phone]   01/30/2017, 1:36 PM    Recent Labs Lab 01/26/17 0915 01/28/17 0331 01/28/17 0900 01/29/17 0357  NA 134* 135 133* 134*  K 3.8 3.5 3.5 3.8  CL 99* 96* 97* 96*  CO2 27 29 28 30   GLUCOSE 118* 117* 122* 84  BUN 28* 16 11 6   CREATININE 3.42* 2.92* 1.99* 1.45*  CALCIUM 7.6* 7.3* 7.2* 7.3*  PHOS 1.7*  --  1.3*  --     Recent Labs Lab 01/25/17 0446  01/28/17 0331 01/28/17 0900 01/29/17 0357  AST 139*  --  142*  --  145*  ALT 98*  --  108*  --  113*  ALKPHOS 90  --  102  --  105  BILITOT 0.8  --  1.0  --  0.8  PROT 5.1*  --  5.1*  --  5.2*  ALBUMIN 1.6*  < > 1.5* 1.5* 1.5*  < > = values in this interval not displayed.  Recent Labs Lab 01/26/17 0915 01/28/17 0900 01/29/17 0357  WBC 8.0 7.2 8.2  HGB 7.8* 7.6* 8.0*  HCT 24.7* 23.8* 25.2*  MCV 97.2 96.7 97.7  PLT 138* 167 182   Iron/TIBC/Ferritin/ %Sat    Component Value Date/Time   IRON 59 01/26/2017 1515   TIBC NOT CALCULATED 01/26/2017 1515   FERRITIN 29 10/27/2014 0435   IRONPCTSAT NOT CALCULATED 01/26/2017 1515   IRONPCTSAT 22 09/06/2008 1552

## 2017-01-30 NOTE — Progress Notes (Signed)
Pharmacy Antibiotic Note [name] James is a 81 y.o. female admitted on 01/19/2017 that is currently on day 3 of vancomycin IV for MRSA pneumonia.   Plan: 1. Continue with vancomycin 750 mg IV post IHD on Monday, Wednesday and Friday 2. Will follow up on changes to IHD schedule and empirically adjust abx as needed   Height: 5\' 3"  (160 cm) Weight: 165 lb 8 oz (75.1 kg) IBW/kg (Calculated) : 52.4  Temp (24hrs), Avg:98.2 F (36.8 C), Min:97.8 F (36.6 C), Max:98.8 F (37.1 C)   Recent Labs Lab 01/25/17 0446 01/26/17 0915 01/28/17 0331 01/28/17 0900 01/29/17 0357  WBC  --  8.0  --  7.2 8.2  CREATININE 2.58* 3.42* 2.92* 1.99* 1.45*    Estimated Creatinine Clearance: 30.5 mL/min (by C-G formula based on SCr of 1.45 mg/dL (H)).    Allergies  Allergen Reactions  . Erythromycin Swelling  . Erythromycin Base     Other reaction(s): GI Upset (intolerance)  . Penicillins Other (See Comments)    Unknown allergic reaction.  She has taken keflex and Rocephin many times without problems  . Codeine Rash and Palpitations    Other reaction(s): GI Upset (intolerance)  . Penicillin G Rash   Antimicrobials this admission:  Azith 1/29 x1 Aztreonam 1/29 x1 Vanc 1/29>>2/2; 2/7>> Tamiflu 1/29>>2/5 Cefepime 1/30>>2/2 LVQ 2/2 >>2/6  Microbiology results:  1/29 BCx: ngtd 1/29 Flu A PCR: pos  1/30 MRSA PCR: neg 1/31 sputum: neg 1/31 strep urine + 2/3 Resp cxr: MRSA   Vincenza Hews, PharmD, BCPS 01/30/2017, 8:40 AM   '

## 2017-01-30 NOTE — Consult Note (Addendum)
Name: [name] James MRN: [id number] DOB: Dec 26, 1935    ADMISSION DATE:  01/19/2017 CONSULTATION DATE:  01/30/2017  REFERRING MD :  Dr. Erlinda Hong  CHIEF COMPLAINT: LLL HCAP  HISTORY OF PRESENT ILLNESS 81 year old female with PMH of Afib (not on anticoagulation, secondary to bleeding risk), ESRD (on HD MWF), Dyslipidemia, GERD, HTN, and IBS presents on 1/29 with 3 week report history of cough with yellow-green sputum. Diagnosised with Sepsis secondary to LLL HCAP and influenza A, sputum culture +MRSA, urine strep pneumo antigen +. Completed course of Oseltamivir, received cefepime and changed to vancomycin on 2/7. Over course patient has increased mucous production and a CT of Chest that reveals multilobar collapse of left lung due to possible mucous impaction. PCCM was asked to consult.   SIGNIFICANT EVENTS  1/29 > To ED with 3 week history of cough/weakness > Sepsis 2nd to HCAP and influenza A   STUDIES:  1/29 CXR > Left base consolidation and probable small effusion, consistent with PNA 2/4 CXR > Central pulmonary vascular congestion and mild bilateral interstitial edema, increased opacity at the left lung base, likely a combination of atelectasis and layering pleural effusion, small right pleural effusion 2/7 CT Chest > Soft tissues densities are noted in the left mainstem bronchus causing multilobar collapse of the left lung, ?mucous impaction, RLL PNA with atelectasis, 5-6 mm nodular densities in the right middle lobe.  2/9 CXR > Opacification of the left hemithorax due to consolidation and pleural effusion, right lung clear   PAST MEDICAL HISTORY :   has a past medical history of Anemia, chronic disease; Anxiety; Arthritis; Atrial fibrillation ([place]); Barrett esophagus; Benign paroxysmal positional vertigo; Breast cancer ([place]) (1980s); Cataract; Chest pain, atypical (12/05/2008); CHF (congestive heart failure) ([place]) (05/13/2011); CKD (chronic kidney disease), stage III; Claudication ([place])  (10/30/2005); Complication of anesthesia; Dyslipidemia; Dysrhythmia; Edema; ESRD (end stage renal disease) ([place]); Family history of adverse reaction to anesthesia; Fever blister (12/2016); Gastritis and gastroduodenitis; GERD (gastroesophageal reflux disease); Gout; Hemodialysis-associated hypotension; HLD (hyperlipidemia); Hypertension; IBS (irritable bowel syndrome); Insomnia; Morbid obesity ([place]); PAF (paroxysmal atrial fibrillation) ([place]); Peripheral neuropathy ([place]); PONV (postoperative nausea and vomiting); Renal cyst; Renal insufficiency (10/30/2005); Renovascular hypertension; Type 2 diabetes mellitus with diabetic neuropathy, with long-term current use of insulin ([place]); and Vertigo.  has a past surgical history that includes Mastectomy (Bilateral, [phone]); Back surgery (~1980); Hemorrhoid surgery; Cholecystectomy; ORIF tibia & fibula fractures (Left, 2007); Esophagogastroduodenoscopy (N/A, 10/24/2014); AV fistula placement (Left, 11/30/2014); Exchange of a dialysis catheter (Left, 11/30/2014); Fistulogram (N/A, 02/12/2015); Angioplasty (Left, 02/12/2015); AV fistula placement (Left, 03/20/2015); Ligation of arteriovenous  fistula (Left, 03/20/2015); AV fistula placement (Left, 04/12/2015); Removal of graft (Left, 04/12/2015); and Eye surgery. Prior to Admission medications   Medication Sig Start Date End Date Taking? Authorizing Provider  albuterol (PROVENTIL) (2.5 MG/3ML) 0.083% nebulizer solution Take 2.5 mg by nebulization. 6AM, 2PM, 10PM x1 week   Yes Historical Provider, MD  allopurinol (ZYLOPRIM) 100 MG tablet Take 100 mg by mouth daily.    Yes Historical Provider, MD  amiodarone (PACERONE) 200 MG tablet Take 200 mg by mouth daily.  06/08/15  Yes Mahima Bubba Camp, MD  atorvastatin (LIPITOR) 80 MG tablet Take 1 tablet (80 mg total) by mouth daily at 6 PM. 12/22/14  Yes Geradine Girt, DO  B Complex-C-Folic Acid (NEPHRO-VITE PO) Take 1 tablet by mouth at bedtime.    Yes Historical Provider, MD  cetirizine  (ZYRTEC) 10 MG tablet Take 5 mg by mouth at bedtime.  Yes Historical Provider, MD  diazepam (VALIUM) 5 MG tablet Take one tablet by mouth every night at bedtime for rest 09/09/16  Yes Lauree Chandler, NP  dicyclomine (BENTYL) 20 MG tablet Take 1 tablet (20 mg total) by mouth 3 (three) times daily. 06/05/15  Yes Jerene Bears, MD  dorzolamide-timolol (COSOPT) 22.3-6.8 MG/ML ophthalmic solution Place 1 drop into the left eye 2 (two) times daily. Wait 2-5 minutes when giving two eye meds   Yes Historical Provider, MD  fluticasone (FLONASE ALLERGY RELIEF) 50 MCG/ACT nasal spray Place 2 sprays into both nostrils daily.   Yes Historical Provider, MD  folic acid (FOLVITE) 1 MG tablet Take 1 mg by mouth daily.   Yes Historical Provider, MD  lanthanum (FOSRENOL) 1000 MG chewable tablet Chew 1,000 mg by mouth 3 (three) times daily with meals.   Yes Historical Provider, MD  meclizine (ANTIVERT) 12.5 MG tablet Take 12.5 mg by mouth 2 (two) times daily as needed for dizziness.    Yes Historical Provider, MD  Melatonin 5 MG TABS Take 5 mg by mouth at bedtime.    Yes Historical Provider, MD  metoprolol tartrate (LOPRESSOR) 25 MG tablet Take 12.5-25 mg by mouth. Take 25 mg Tues, Thurs, Sat, Sun at [facility].  Take 1/2 tablet to = 12.5 mg on Tues, Thurs, Sat, Sun at [facility] for SBP <100 HR less then 60   Yes Historical Provider, MD  midodrine (PROAMATINE) 10 MG tablet Take 10 mg by mouth every Monday, Wednesday, and Friday. On dialysis days   Yes Historical Provider, MD  midodrine (PROAMATINE) 5 MG tablet Take 5 mg by mouth daily. Send 1 tab with patient to dialysis M-W-F to take mid treatment due to dropping BP   Yes Historical Provider, MD  OXYGEN Inhale 2 L/min into the lungs as needed.    Yes Historical Provider, MD  UNABLE TO FIND Med Name: Preparation H CRM Al[place].  Apply to external hemorrhoids BID PRN.   Yes Historical Provider, MD  promethazine (PHENERGAN) 25 MG tablet Take 25 mg by mouth. Take 1 tablet daily prn  and q8h prn    Historical Provider, MD   Allergies  Allergen Reactions  . Erythromycin Swelling  . Erythromycin Base     Other reaction(s): GI Upset (intolerance)  . Penicillins Other (See Comments)    Unknown allergic reaction.  She has taken keflex and Rocephin many times without problems  . Codeine Rash and Palpitations    Other reaction(s): GI Upset (intolerance)  . Penicillin G Rash    FAMILY HISTORY:  family history includes Cancer in her mother and sister; Diabetes in her mother; Heart attack in her father; Heart disease in her mother; Parkinson's disease in her brother; Stroke in her father. SOCIAL HISTORY:  reports that she has never smoked. She has never used smokeless tobacco. She reports that she does not drink alcohol or use drugs.  REVIEW OF SYSTEMS:   All negative; except for those that are bolded, which indicate positives.  Constitutional: weight loss, weight gain, night sweats, fevers, chills, fatigue, weakness.  HEENT: headaches, sore throat, sneezing, nasal congestion, post nasal drip, difficulty swallowing, tooth/dental problems, visual complaints, visual changes, ear aches. Neuro: difficulty with speech, weakness, numbness, ataxia. CV:  chest pain, orthopnea, PND, swelling in lower extremities, dizziness, palpitations, syncope.  Resp: cough, hemoptysis, dyspnea, wheezing. GI: heartburn, indigestion, abdominal pain, nausea, vomiting, diarrhea, constipation, change in bowel habits, loss of appetite, hematemesis, melena, hematochezia.  GU: dysuria, change in color  of urine, urgency or frequency, flank pain, hematuria. MSK: joint pain or swelling, decreased range of motion. Psych: change in mood or affect, depression, anxiety, suicidal ideations, homicidal ideations. Skin: rash, itching, bruising.  SUBJECTIVE:  Currently receiving HD. Increased thick secretions.   VITAL SIGNS: Temp:  [97.8 F (36.6 C)-98.8 F (37.1 C)] 98 F (36.7 C) (02/09 0814) Pulse Rate:   [72-86] 80 (02/09 0814) Resp:  [17-26] 18 (02/09 0814) BP: (110-136)/(35-87) 110/87 (02/09 0814) SpO2:  [85 %-100 %] 100 % (02/09 0814) Weight:  [75.1 kg (165 lb 8 oz)] 75.1 kg (165 lb 8 oz) (02/09 0400)  PHYSICAL EXAMINATION: General: Elderly, no distress, lying in bed  Neuro: Alert, oriented, follows commands, moves all extremities  HEENT:  Normocephalic  Cardiovascular:  RRR, no MRG, NI S1/S2 Lungs: absent breath sound to left, diminished clear to right Abdomen: non-tender, non-distended, active bowel sounds  Musculoskeletal:  No edema  Skin:  Warm, dry, intact    Recent Labs Lab 01/28/17 0331 01/28/17 0900 01/29/17 0357  NA 135 133* 134*  K 3.5 3.5 3.8  CL 96* 97* 96*  CO2 29 28 30   BUN 16 11 6   CREATININE 2.92* 1.99* 1.45*  GLUCOSE 117* 122* 84    Recent Labs Lab 01/26/17 0915 01/28/17 0900 01/29/17 0357  HGB 7.8* 7.6* 8.0*  HCT 24.7* 23.8* 25.2*  WBC 8.0 7.2 8.2  PLT 138* 167 182   Ct Chest Wo Contrast  Result Date: 01/28/2017 CLINICAL DATA:  Pneumonia, CHF, nonsmoker EXAM: CT CHEST WITHOUT CONTRAST TECHNIQUE: Multidetector CT imaging of the chest was performed following the standard protocol without IV contrast. COMPARISON:  CXR from 01/25/2017 FINDINGS: Cardiovascular: Normal sized cardiac chambers. Aortic atherosclerosis without aneurysm. There is coronary arteriosclerosis. Trace anterior pericardial effusion or thickening. Normal branch pattern of the great vessels. Mediastinum/Nodes: No pathologically enlarged lymph nodes. Trachea is unremarkable. Thyroid gland demonstrates coarse calcification on the right and 9 mm hypodense nodule on the left. Lungs/Pleura: There are soft tissue densities in the distal left mainstem bronchus causing postobstructive atelectasis pulmonary consolidation of the left lung. Slight mediastinal shift to the left due to volume loss. Findings may be secondary to mucous impaction. Small left pleural effusion. 5 - 6 mm pulmonary nodular  densities in the right middle lobe, series 201, image 55, 57, and 58. Patchy airspace disease in the right lower lobe consistent with pneumonia. Upper Abdomen: Bilateral breast implants with possible old intracapsular rupture on the left. Cholecystectomy. Cyst off the upper pole the right kidney measuring 1.9 cm. No adrenal mass. Musculoskeletal: Degenerative changes along the dorsal spine. No acute or suspicious osseous abnormalities. IMPRESSION: 1. Soft tissue densities are noted in the left mainstem bronchus causing multilobar collapse of the left lung. This may be from mucous impaction. Endobronchial lesions are not entirely excluded but given that this is a new recent finding, mucous plugging is believed more likely. Moderate left pleural effusion. 2. Right lower lobe pneumonia with atelectasis. 3. 5 - 6 mm nodular densities in the right middle lobe. Non-contrast chest CT at 3-6 months is recommended. If the nodules are stable at time of repeat CT, then future CT at 18-24 months (from today's scan) is considered optional for low-risk patients, but is recommended for high-risk patients. This recommendation follows the consensus statement: Guidelines for Management of Incidental Pulmonary Nodules Detected on CT Images: From the Fleischner Society 2017; Radiology 2017; 284:228-243. 4. Intracapsular rupture of the left breast implant. 5. Right upper pole renal cyst measuring 1.9 cm.  Electronically Signed   By: Ashley Royalty M.D.   On: 01/28/2017 22:06   Dg Chest Port 1 View  Result Date: 01/30/2017 CLINICAL DATA:  Pneumonia EXAM: PORTABLE CHEST 1 VIEW COMPARISON:  Chest radiograph January 25, 2017 and chest CT January 28, 2017 FINDINGS: There is consolidation with effusion throughout the left lung with essentially complete opacification of the left hemithorax. There is shift of heart and mediastinum toward the left. The right lung is clear. Heart size appears grossly stable with opacification along the left heart  border due to the consolidation and effusion. The pulmonary vascularity on the right appears normal. Pulmonary vascularity on the left is obscured. No adenopathy seen on the right. There is aortic atherosclerosis. No evident bone lesions. IMPRESSION: Opacification of the left hemithorax due to consolidation and pleural effusion, similar to recent CT appearance. Right lung clear. Shift of heart and mediastinum toward the left as noted on CT examination 2 days prior. Aortic atherosclerosis. Electronically Signed   By: Lowella Grip III M.D.   On: 01/30/2017 08:40    ASSESSMENT / PLAN:  MRSA Left Lower Lobe Pneumonia with multilobar collapse of the left lung secondary to possible mucous impaction Plan -Maintain Saturation >92 -Pulmonary Hygiene > IS, flutter valve, Mobilize  -Albuterol PRN -Continue Mucinex  -Continue Vancomycin  -Continue Chest PT -Mucomyst Neb Q4H -Hypertonic Saline Neb Q6H -Albuterol Q4H  -Will evaluate further for possible need for bronch - will attempt conservative management given baseline functional status and co-morbidites    Hayden Pedro, AG-ACNP [facility]  Pgr: [phone]  PCCM Pgr: [phone]  Attending Note:  81 year old fairly debilitated female with ESRD-HD presenting to PCCM with PNA and atelectasis of the left side and mucous plugging.  On exam, patient is laying left side down with very diminished BS on the left.  I reviewed CXR myself, LLL atelectasis noted.  Discussed with PCCM-NP.  Atelectasis:  - IS   - Flutter valve  - Ambulate  Mucous plug  - Hypertonic saline  - Mucinex  - Chest PT  - Position left side up  - Vibravest  - No bronchoscopy for now  - F/U CXR  PNA:  - Vancomycin   - FU on cultures  Hypoxemia:  - Titrate O2 for sat of 88-92%  - May need arrangement for home O2.  Not a candidate for bronchoscopy as due to position, weakness and lack of activity the plugging will recur so removing it  now without fixing underlying issues as above would be an exercise in futility.  Recommend involvement of palliative care.  PCCM will sign off, please call back if needed.  Patient seen and examined, agree with above note.  I dictated the care and orders written for this patient under my direction.  Rush Farmer, MD [phone]

## 2017-01-30 NOTE — Progress Notes (Signed)
[name] NOTE  LYBERTI THRUSH James:[id number] DOB: 29-Jan-1936 DOA: 01/19/2017 PCP: [name] James, [name]  HPI/Recap of past 24 hours:  Pt is very frail but oriented x3, c/o cough   Assessment/Plan: Principal Problem:   Sepsis ([name] James) Active Problems:   Essential hypertension   Anemia   ESRD (end stage renal disease) (H[facility])   Paroxysmal atrial fibrillation ([name] James)   Controlled type 2 diabetes mellitus with diabetic polyneuropathy, with long-term current use of insulin ([facility])   HCAP (healthcare-associated pneumonia)   Pressure ulcer   Advance care planning   Goals of care, counseling/discussion   Palliative care by specialist  Sepsis from lobar pneumonia:  Patient meets sepsis criteria on admission given tachypnea, leukocytosis, and evidence of organ dysfunction.  Source of infection left lower lobe pna and influenza A.   Sputum Culture + MRSA,  urine strep pneumo antigen +, legionella antigen negative   -she finished treatment with Oseltamivir renal dosing, - she received vanc/aztreonam/zithro on day of admission, abx changed to cefepime since admission, vanc was stopped due to mrsa screen negative, Pt transitioned to levaquin on 2/3,  -her sputum culture was collected on 2/3, +mrsa that is resistant to cipro, abx changed to vanc on 2/7.  Left lung collapse: likely from mucus plugging, aggressive chest PT, pulmonology/critical care consulted for possible bronch  H/o Hypertension: presented with hypotension/sepsis midodrine for intradialytic hypotension -patient presented with hypotension, sbp in 80's initial on presentation,  - home meds betablocker has been on hold since admission, bp still low , especially during dialysis   ESRD on HD MWF:  -Continue midodrine with dialysis - nephrology consulted and assisting - difficult to remove fluids due to hypotension during dialysis, nephrology recommended palliative care consult.  Elevated LFTs: No abdominal pain or nausea.   Presumably from sepsis/influenza/fluids overload, congestion. She has also been on amiodarone chronically -Trend LFTs , u/s  "Previous cholecystectomy. Normal calibered common bile duct but mild prominence of the pancreatic duct of uncertain etiology. Heterogeneous hepatic echotexture without discrete mass or intrahepatic ductal dilation. If the elevated liver function studies remain increased, hepatic protocol MRI would be a useful next imaging step. -I have discussed this findings with the daughter and patient , they prefer to continue amiodarone for now   pAF:  CHADS2Vasc 7.  Not on warfarin because of risk of bleeding. -Continue amiodarone -Continue full aspirin   Chronic diastolic CHF: -Strict I/Os - volume control with dialysis, but difficult to pull fluids off during dialysis  Right pleural effusion, volume managed by HD.  Anemia: hgb 7.6, no sign of bleeding, transfuse if hgb less than 7   noninsulin dependent Diabetes/hyperglycemia:  - a1c 5.7 - currently on ssi  Other medications:  -Continue allopurinol and PPI  Pressure ulcer, presented on admission -Stage 2 Pressure injury sacrum with non blanchable redness over the buttocks.  presented on admission.  Patient is incontinent of stool, so the redness also can be related to MASD  Wound care input appreciated  FTT: baseline bed to wheelchair bound, need lift for transfer for the last two years  DVT prophylaxis: Lovenox   Code Status: full  Family Communication: patient    Disposition Plan: very weak, left lung collapse, blood pressure to low to remove fluids during dialysis, poor prognosis,   Consultants:  Nephrology  Pulmonology/critical care  Palliative care  Procedures:  Dialysis MWF  Antibiotics:  Tamiflu Vanc/aztreonam/zithromax in the ED, cefepime than levaquin then vanc  Objective: BP 110/87   Pulse 80  Temp 98 F (36.7 C) (Oral)   Resp 18   Ht 5\' 3"  (1.6 m)   Wt 75.1 kg (165  lb 8 oz)   SpO2 100%   BMI 29.32 kg/m   Intake/Output Summary (Last 24 hours) at 01/30/17 0831 Last data filed at 01/29/17 1900  Gross per 24 hour  Intake              600 ml  Output                0 ml  Net              600 ml   Filed Weights   01/28/17 1335 01/29/17 0355 01/30/17 0400  Weight: 74.8 kg (164 lb 14.5 oz) 73.5 kg (162 lb 1.6 oz) 75.1 kg (165 lb 8 oz)    Exam:   General:  Very Frail, pale, alert and awake  Cardiovascular: RRR, no rubs  Respiratory: decreased breath sounds, +rhonchi, crackles, left lower lobe. Better aeration right lung. No wheezing  Abdomen: Soft/ND/NT, positive BS  Musculoskeletal: no edema, Chronic foot drop, more on the left  Neuro: aaox3  Data Reviewed: Basic Metabolic Panel:  Recent Labs Lab 01/25/17 0446 01/26/17 0915 01/28/17 0331 01/28/17 0900 01/29/17 0357  NA 134* 134* 135 133* 134*  K 3.5 3.8 3.5 3.5 3.8  CL 101 99* 96* 97* 96*  CO2 28 27 29 28 30   GLUCOSE 128* 118* 117* 122* 84  BUN 16 28* 16 11 6   CREATININE 2.58* 3.42* 2.92* 1.99* 1.45*  CALCIUM 7.4* 7.6* 7.3* 7.2* 7.3*  PHOS  --  1.7*  --  1.3*  --    Liver Function Tests:  Recent Labs Lab 01/25/17 0446 01/26/17 0915 01/28/17 0331 01/28/17 0900 01/29/17 0357  AST 139*  --  142*  --  145*  ALT 98*  --  108*  --  113*  ALKPHOS 90  --  102  --  105  BILITOT 0.8  --  1.0  --  0.8  PROT 5.1*  --  5.1*  --  5.2*  ALBUMIN 1.6* 1.6* 1.5* 1.5* 1.5*   No results for input(s): LIPASE, AMYLASE in the last 168 hours. No results for input(s): AMMONIA in the last 168 hours. CBC:  Recent Labs Lab 01/26/17 0915 01/28/17 0900 01/29/17 0357  WBC 8.0 7.2 8.2  HGB 7.8* 7.6* 8.0*  HCT 24.7* 23.8* 25.2*  MCV 97.2 96.7 97.7  PLT 138* 167 182   Cardiac Enzymes:    Recent Labs Lab 01/29/17 0357  CKTOTAL 996*   BNP (last 3 results)  Recent Labs  01/20/17 0634 01/25/17 1742  BNP 451.4* 214.8*    ProBNP (last 3 results) No results for input(s): PROBNP  in the last 8760 hours.  CBG:  Recent Labs Lab 01/29/17 0752 01/29/17 1240 01/29/17 1712 01/29/17 2157 01/30/17 0817  GLUCAP 78 104* 140* 116* 85    Recent Results (from the past 240 hour(s))  MRSA PCR Screening     Status: James   Collection Time: 01/20/17 12:10 PM  Result Value Ref Range Status   MRSA by PCR NEGATIVE NEGATIVE Final    Comment:        The GeneXpert MRSA Assay (FDA approved for NASAL specimens only), is one component of a comprehensive MRSA colonization surveillance program. It is not intended to diagnose MRSA infection nor to guide or monitor treatment for MRSA infections.   Culture, sputum-assessment     Status: James  Collection Time: 01/20/17 10:31 PM  Result Value Ref Range Status   Specimen Description EXPECTORATED SPUTUM  Final   Special Requests James  Final   Sputum evaluation   Final    Sputum specimen not acceptable for testing.  Please recollect.   Gram Stain Report Called to,Read Back By and Verified With: MILLER,RN @0132  01/21/17 MKELLY,MLT    Report Status 01/21/2017 FINAL  Final  Culture, expectorated sputum-assessment     Status: James   Collection Time: 01/24/17  2:23 PM  Result Value Ref Range Status   Specimen Description EXPECTORATED SPUTUM  Final   Special Requests James  Final   Sputum evaluation THIS SPECIMEN IS ACCEPTABLE FOR SPUTUM CULTURE  Final   Report Status 01/24/2017 FINAL  Final  Culture, respiratory (NON-Expectorated)     Status: James   Collection Time: 01/24/17  2:23 PM  Result Value Ref Range Status   Specimen Description EXPECTORATED SPUTUM  Final   Special Requests James Reflexed from [id number]  Final   Gram Stain   Final    ABUNDANT WBC PRESENT, PREDOMINANTLY PMN MODERATE GRAM POSITIVE COCCI IN PAIRS MODERATE GRAM POSITIVE COCCI IN CHAINS FEW GRAM NEGATIVE RODS FEW YEAST    Culture   Final    MODERATE METHICILLIN RESISTANT STAPHYLOCOCCUS AUREUS   Report Status 01/27/2017 FINAL  Final   Organism ID, Bacteria  METHICILLIN RESISTANT STAPHYLOCOCCUS AUREUS  Final      Susceptibility   Methicillin resistant staphylococcus aureus - MIC*    CIPROFLOXACIN >=8 RESISTANT Resistant     ERYTHROMYCIN >=8 RESISTANT Resistant     GENTAMICIN <=0.5 SENSITIVE Sensitive     OXACILLIN >=4 RESISTANT Resistant     TETRACYCLINE <=1 SENSITIVE Sensitive     VANCOMYCIN <=0.5 SENSITIVE Sensitive     TRIMETH/SULFA >=320 RESISTANT Resistant     CLINDAMYCIN <=0.25 SENSITIVE Sensitive     RIFAMPIN <=0.5 SENSITIVE Sensitive     Inducible Clindamycin NEGATIVE Sensitive     * MODERATE METHICILLIN RESISTANT STAPHYLOCOCCUS AUREUS     Studies: No results found.  Scheduled Meds: . allopurinol  100 mg Oral Daily  . amiodarone  200 mg Oral Daily  . aspirin  325 mg Oral Daily  . darbepoetin (ARANESP) injection - DIALYSIS  60 mcg Intravenous Q Fri-HD  . feeding supplement  1 Container Oral TID BM  . guaiFENesin  600 mg Oral BID  . heparin  5,000 Units Subcutaneous Q8H  . insulin aspart  0-5 Units Subcutaneous QHS  . insulin aspart  0-9 Units Subcutaneous TID WC  . mouth rinse  15 mL Mouth Rinse BID  . megestrol  200 mg Oral Daily  . midodrine  10 mg Oral Q M,W,F-HD  . multivitamin  1 tablet Oral QHS  . pantoprazole  40 mg Oral Daily  . vancomycin  750 mg Intravenous Q M,W,F-HD    Continuous Infusions:   Time spent: 68mins  Jeselle Hiser MD, PhD  [facility] Pager [phone] If 7PM-7AM, please contact night-coverage at [email or web address], password [facility] 01/30/2017, 8:31 AM  LOS: 11 days

## 2017-01-30 NOTE — Progress Notes (Addendum)
SLP Cancellation Note  Patient Details Name: [name] James MRN: [id number] DOB: 03-05-1936   Cancelled treatment:       Reason Eval/Treat Not Completed: Patient at procedure or test/unavailable. Pt off unit @ 1005. SLP rechecked at 1245 and pt still off unit. Will continue efforts to complete BSE. Pt currently receiving regular diet/thin liquids.  [name] James B. Quentin Ore [facility], CCC-SLP [phone]  Shonna Chock 01/30/2017, 10:04 AM, 12:54PM

## 2017-01-30 NOTE — Care Management Important Message (Signed)
Important Message  Patient Details  Name: [name] James MRN: [id number] Date of Birth: [birth year]   Medicare Important Message Given:  Yes    Davarion Cuffee T, RN 01/30/2017, 3:26 PM

## 2017-01-31 ENCOUNTER — Inpatient Hospital Stay (HOSPITAL_COMMUNITY): Payer: Medicare Other

## 2017-01-31 DIAGNOSIS — J181 Lobar pneumonia, unspecified organism: Secondary | ICD-10-CM

## 2017-01-31 LAB — GLUCOSE, CAPILLARY
GLUCOSE-CAPILLARY: 104 mg/dL — AB (ref 65–99)
GLUCOSE-CAPILLARY: 144 mg/dL — AB (ref 65–99)
Glucose-Capillary: 109 mg/dL — ABNORMAL HIGH (ref 65–99)
Glucose-Capillary: 164 mg/dL — ABNORMAL HIGH (ref 65–99)

## 2017-01-31 MED ORDER — ACETYLCYSTEINE 20 % IN SOLN
4.0000 mL | Freq: Three times a day (TID) | RESPIRATORY_TRACT | Status: DC
  Administered 2017-02-01 – 2017-02-03 (×7): 4 mL via RESPIRATORY_TRACT
  Filled 2017-01-31 (×8): qty 4

## 2017-01-31 MED ORDER — ALBUTEROL SULFATE (2.5 MG/3ML) 0.083% IN NEBU
2.5000 mg | INHALATION_SOLUTION | Freq: Three times a day (TID) | RESPIRATORY_TRACT | Status: DC
  Administered 2017-02-01 – 2017-02-03 (×7): 2.5 mg via RESPIRATORY_TRACT
  Filled 2017-01-31 (×8): qty 3

## 2017-01-31 NOTE — Progress Notes (Signed)
PROGRESS NOTE  [name] James YQM:[id number] DOB: 05/21/1936 DOA: 01/19/2017 PCP: [name], MD  HPI/Recap of past 24 hours:  PT is very frail but oriented x3, c/o cough  Assessment/Plan: Principal Problem:   Sepsis ([place]) Active Problems:   Essential hypertension   Anemia   ESRD (end stage renal disease) ([place])   Paroxysmal atrial fibrillation ([place])   Controlled type 2 diabetes mellitus with diabetic polyneuropathy, with long-term current use of insulin (HCC)   HCAP (healthcare-associated pneumonia)   Pressure ulcer   Advance care planning   Goals of care, counseling/discussion   Palliative care by specialist  Sepsis from lobar pneumonia:  Patient meets sepsis criteria on admission given tachypnea, leukocytosis, and evidence of organ dysfunction.  Source of infection left lower lobe pna and influenza A.   Sputum Culture + MRSA,  urine strep pneumo antigen +, legionella antigen negative   -she finished treatment with Oseltamivir renal dosing, - she received vanc/aztreonam/zithro on day of admission, abx changed to cefepime since admission, vanc was stopped due to mrsa screen negative, Pt transitioned to levaquin on 2/3,  -her sputum culture was collected on 2/3, +mrsa that is resistant to cipro, abx changed to vanc on 2/7.  Left lung collapse: likely from mucus plugging, aggressive chest PT, pulmonology/critical care consulted who recommend continue aggressive chest PT, per pulmonology bronchoscopy is a futile practice.   Repeat cxr on 2/10 with some improvement.  H/o Hypertension: presented with hypotension/sepsis midodrine for intradialytic hypotension -patient presented with hypotension, sbp in 80's initial on presentation,  - home meds betablocker has been on hold since admission, bp still low , especially during dialysis   ESRD on HD MWF:  -Continue midodrine with dialysis - nephrology consulted and assisting - difficult to remove fluids due to hypotension  during dialysis, nephrology recommended palliative care consult.  Elevated LFTs: No abdominal pain or nausea.  hepatitis panel negative,  - u/s  "Previous cholecystectomy. Normal calibered common bile duct but mild prominence of the pancreatic duct of uncertain etiology. Heterogeneous hepatic echotexture without discrete mass or intrahepatic ductal dilation. If the elevated liver function studies remain increased, hepatic protocol MRI would be a useful next imaging step." Elevated lft Presumably is from sepsis/influenza/fluids overload, congestion. She has also been on amiodarone chronically -I have discussed this findings with the daughter and patient , they prefer to continue amiodarone for now   pAF:  CHADS2Vasc 7.  Not on warfarin because of risk of bleeding. -Continue amiodarone -Continue aspirin 325mg    Chronic diastolic CHF: -Strict I/Os - volume control with dialysis, but difficult to pull fluids off during dialysis  Right pleural effusion, volume managed by HD.  Anemia: hgb 7.6, no sign of bleeding, transfuse if hgb less than 7   noninsulin dependent Diabetes/hyperglycemia:  - a1c 5.7 - currently on ssi  Other medications:  -Continue allopurinol and PPI  Pressure ulcer, presented on admission -Stage 2 Pressure injury sacrum with non blanchable redness over the buttocks.  presented on admission.  Patient is incontinent of stool, so the redness also can be related to MASD  Wound care input appreciated  FTT: baseline bed to wheelchair bound, need lift for transfer for the last two years  DVT prophylaxis: Lovenox   Code Status: full  Family Communication: patient    Disposition Plan: very weak, left lung collapse, blood pressure to low to remove fluids during dialysis, poor prognosis,   Consultants:  Nephrology  Pulmonology/critical care  Palliative care  Procedures:  Dialysis MWF  Antibiotics:  Tamiflu Vanc/aztreonam/zithromax in the ED,  cefepime than levaquin then vanc  Objective: BP 114/68 (BP Location: Right Arm)   Pulse 81   Temp 98.1 F (36.7 C) (Oral)   Resp 20   Ht 5\' 3"  (1.6 m)   Wt 74.8 kg (164 lb 14.5 oz)   SpO2 100%   BMI 29.21 kg/m   Intake/Output Summary (Last 24 hours) at 01/31/17 1529 Last data filed at 01/30/17 1900  Gross per 24 hour  Intake              240 ml  Output                0 ml  Net              240 ml   Filed Weights   01/30/17 0907 01/30/17 1312 01/31/17 0400  Weight: 74.9 kg (165 lb 2 oz) 75 kg (165 lb 5.5 oz) 74.8 kg (164 lb 14.5 oz)    Exam:   General:  Very Frail, pale, aaox3  Cardiovascular: RRR, no rubs  Respiratory: decreased breath sounds, +rhonchi, crackles, left lower lobe. Better aeration right lung. No wheezing  Abdomen: Soft/ND/NT, positive BS  Musculoskeletal: no edema, Chronic foot drop, more on the left  Neuro: aaox3  Data Reviewed: Basic Metabolic Panel:  Recent Labs Lab 01/25/17 0446 01/26/17 0915 01/28/17 0331 01/28/17 0900 01/29/17 0357  NA 134* 134* 135 133* 134*  K 3.5 3.8 3.5 3.5 3.8  CL 101 99* 96* 97* 96*  CO2 28 27 29 28 30   GLUCOSE 128* 118* 117* 122* 84  BUN 16 28* 16 11 6   CREATININE 2.58* 3.42* 2.92* 1.99* 1.45*  CALCIUM 7.4* 7.6* 7.3* 7.2* 7.3*  PHOS  --  1.7*  --  1.3*  --    Liver Function Tests:  Recent Labs Lab 01/25/17 0446 01/26/17 0915 01/28/17 0331 01/28/17 0900 01/29/17 0357  AST 139*  --  142*  --  145*  ALT 98*  --  108*  --  113*  ALKPHOS 90  --  102  --  105  BILITOT 0.8  --  1.0  --  0.8  PROT 5.1*  --  5.1*  --  5.2*  ALBUMIN 1.6* 1.6* 1.5* 1.5* 1.5*   No results for input(s): LIPASE, AMYLASE in the last 168 hours. No results for input(s): AMMONIA in the last 168 hours. CBC:  Recent Labs Lab 01/26/17 0915 01/28/17 0900 01/29/17 0357  WBC 8.0 7.2 8.2  HGB 7.8* 7.6* 8.0*  HCT 24.7* 23.8* 25.2*  MCV 97.2 96.7 97.7  PLT 138* 167 182   Cardiac Enzymes:    Recent Labs Lab 01/29/17 0357    CKTOTAL 996*   BNP (last 3 results)  Recent Labs  01/20/17 0634 01/25/17 1742  BNP 451.4* 214.8*    ProBNP (last 3 results) No results for input(s): PROBNP in the last 8760 hours.  CBG:  Recent Labs Lab 01/30/17 0817 01/30/17 1617 01/30/17 2052 01/31/17 0859 01/31/17 1249  GLUCAP 85 154* 186* 104* 109*    Recent Results (from the past 240 hour(s))  Culture, expectorated sputum-assessment     Status: None   Collection Time: 01/24/17  2:23 PM  Result Value Ref Range Status   Specimen Description EXPECTORATED SPUTUM  Final   Special Requests NONE  Final   Sputum evaluation THIS SPECIMEN IS ACCEPTABLE FOR SPUTUM CULTURE  Final   Report Status 01/24/2017 FINAL  Final  Culture, respiratory (NON-Expectorated)  Status: None   Collection Time: 01/24/17  2:23 PM  Result Value Ref Range Status   Specimen Description EXPECTORATED SPUTUM  Final   Special Requests NONE Reflexed from [id number]  Final   Gram Stain   Final    ABUNDANT WBC PRESENT, PREDOMINANTLY PMN MODERATE GRAM POSITIVE COCCI IN PAIRS MODERATE GRAM POSITIVE COCCI IN CHAINS FEW GRAM NEGATIVE RODS FEW YEAST    Culture   Final    MODERATE METHICILLIN RESISTANT STAPHYLOCOCCUS AUREUS   Report Status 01/27/2017 FINAL  Final   Organism ID, Bacteria METHICILLIN RESISTANT STAPHYLOCOCCUS AUREUS  Final      Susceptibility   Methicillin resistant staphylococcus aureus - MIC*    CIPROFLOXACIN >=8 RESISTANT Resistant     ERYTHROMYCIN >=8 RESISTANT Resistant     GENTAMICIN <=0.5 SENSITIVE Sensitive     OXACILLIN >=4 RESISTANT Resistant     TETRACYCLINE <=1 SENSITIVE Sensitive     VANCOMYCIN <=0.5 SENSITIVE Sensitive     TRIMETH/SULFA >=320 RESISTANT Resistant     CLINDAMYCIN <=0.25 SENSITIVE Sensitive     RIFAMPIN <=0.5 SENSITIVE Sensitive     Inducible Clindamycin NEGATIVE Sensitive     * MODERATE METHICILLIN RESISTANT STAPHYLOCOCCUS AUREUS     Studies: Dg Chest Port 1 View  Result Date: 01/31/2017 CLINICAL  DATA:  Pneumonia EXAM: PORTABLE CHEST 1 VIEW COMPARISON:  01/30/2017 FINDINGS: Left mid and lower lung airspace opacities noted, compatible with pneumonia. Aeration in the left lung as improved since prior study. Probable small to moderate left effusion. No confluent opacity on the right. IMPRESSION: Improving aeration on the left. Continued small to moderate left pleural effusion with left mid and lower lung consolidation. Electronically Signed   By: Rolm Baptise M.D.   On: 01/31/2017 08:49    Scheduled Meds: . acetylcysteine  4 mL Nebulization Q4H  . albuterol  2.5 mg Nebulization Q4H  . allopurinol  100 mg Oral Daily  . amiodarone  200 mg Oral Daily  . aspirin  325 mg Oral Daily  . darbepoetin (ARANESP) injection - DIALYSIS  60 mcg Intravenous Q Fri-HD  . feeding supplement  1 Container Oral TID BM  . guaiFENesin  600 mg Oral BID  . heparin  5,000 Units Subcutaneous Q8H  . insulin aspart  0-5 Units Subcutaneous QHS  . insulin aspart  0-9 Units Subcutaneous TID WC  . mouth rinse  15 mL Mouth Rinse BID  . megestrol  200 mg Oral Daily  . midodrine  10 mg Oral Q M,W,F-HD  . multivitamin  1 tablet Oral QHS  . pantoprazole  40 mg Oral Daily  . sodium chloride HYPERTONIC  4 mL Nebulization Q6H  . vancomycin  750 mg Intravenous Q M,W,F-HD    Continuous Infusions:   Time spent: 29mins  Lovella Hardie MD, PhD  [facility] Pager [phone] If 7PM-7AM, please contact night-coverage at [email or web address], password [facility] 01/31/2017, 3:29 PM  LOS: 12 days

## 2017-01-31 NOTE — Evaluation (Signed)
Clinical/Bedside Swallow Evaluation Patient Details  Name: [name] James MRN: [id number] Date of Birth: 10-02-1936  Today's Date: 01/31/2017 Time: SLP Start Time (ACUTE ONLY): 0814 SLP Stop Time (ACUTE ONLY): 0950 SLP Time Calculation (min) (ACUTE ONLY): 12 min  Past Medical History:  Past Medical History:  Diagnosis Date  . Anemia, chronic disease   . Anxiety   . Arthritis    knees  . Atrial fibrillation ([place])   . Barrett esophagus   . Benign paroxysmal positional vertigo   . Breast cancer ([place]) 1980s   bil mastectomies, no radiation or chemo  . Cataract   . Chest pain, atypical 12/05/2008   R/Lmv- normal perfusion all regions, noe ECG changes   . CHF (congestive heart failure) ([place]) 05/13/2011   echo - GY>18%; stage 1 diastolic dysfunction; elevated LV filling pressure, MAC  . CKD (chronic kidney disease), stage III   . Claudication ([place]) 10/30/2005   doppler - normal evaluation, no evidence of aneurysm, diameter reduction, dissection, compression or vascular abnormality)  . Complication of anesthesia   . Dyslipidemia   . Dysrhythmia    Atrial Fibrillation  . Edema   . ESRD (end stage renal disease) ([place])   . Family history of adverse reaction to anesthesia    son also has nausea   . Fever blister 12/2016  . Gastritis and gastroduodenitis   . GERD (gastroesophageal reflux disease)   . Gout   . Hemodialysis-associated hypotension   . HLD (hyperlipidemia)   . Hypertension   . IBS (irritable bowel syndrome)   . Insomnia   . Morbid obesity ([place])   . PAF (paroxysmal atrial fibrillation) ([place])   . Peripheral neuropathy ([place])   . PONV (postoperative nausea and vomiting)   . Renal cyst   . Renal insufficiency 10/30/2005   doppler - abn resistance consistent w/ parenchymal disease  . Renovascular hypertension   . Type 2 diabetes mellitus with diabetic neuropathy, with long-term current use of insulin ([place])   . Vertigo    Past Surgical History:  Past Surgical History:   Procedure Laterality Date  . ANGIOPLASTY Left 02/12/2015   Procedure: ANGIOPLASTY;  Surgeon: Angelia Mould, MD;  Location: [facility] CATH LAB;  Service: Cardiovascular;  Laterality: Left;  AVF  . AV FISTULA PLACEMENT Left 11/30/2014   Procedure: ARTERIOVENOUS (AV) FISTULA CREATION LEFT ARM;  Surgeon: Angelia Mould, MD;  Location: [place];  Service: Vascular;  Laterality: Left;  . AV FISTULA PLACEMENT Left 03/20/2015   Procedure:  Inserton of Left Upper Arm Gortex Graft;  Surgeon: Angelia Mould, MD;  Location: [place];  Service: Vascular;  Laterality: Left;  . AV FISTULA PLACEMENT Left 04/12/2015   Procedure: INSERTION OF LEFT ARM  ARTERIOVENOUS GORE-TEX GRAFT ;  Surgeon: Angelia Mould, MD;  Location: [place];  Service: Vascular;  Laterality: Left;  . BACK SURGERY  ~1980  . CHOLECYSTECTOMY    . ESOPHAGOGASTRODUODENOSCOPY N/A 10/24/2014   Procedure: ESOPHAGOGASTRODUODENOSCOPY (EGD);  Surgeon: Jerene Bears, MD;  Location[facility] ENDOSCOPY;  Service: Endoscopy;  Laterality: N/A;  . EXCHANGE OF A DIALYSIS CATHETER Left 11/30/2014   Procedure: EXCHANGE OF A DIALYSIS CATHETER, LEFT INTERNAL JUGULAR;  Surgeon: Angelia Mould, MD;  Location: [place];  Service: Vascular;  Laterality: Left;  . EYE SURGERY    . FISTULOGRAM N/A 02/12/2015   Procedure: FISTULOGRAM;  Surgeon: Angelia Mould, MD;  Location: [facility] CATH LAB;  Service: Cardiovascular;  Laterality: N/A;  . HEMORRHOID SURGERY    .  LIGATION OF ARTERIOVENOUS  FISTULA Left 03/20/2015   Procedure: LIGATION OF ARTERIOVENOUS  FISTULA;  Surgeon: Angelia Mould, MD;  Location: [place];  Service: Vascular;  Laterality: Left;  Marland Kitchen MASTECTOMY Bilateral ~1982   bil breast reconstruction with implants  . ORIF TIBIA & FIBULA FRACTURES Left 2007   also had left non displaced malleolar fracture.   Marland Kitchen REMOVAL OF GRAFT Left 04/12/2015   Procedure: REMOVAL OF LEFT ARM ARTERIOVENOUS GORE-TEX GRAFT;  Surgeon: Angelia Mould, MD;   Location: [facility] OR;  Service: Vascular;  Laterality: Left;   HPI:  81 year old female with PMH of Afib (not on anticoagulation, secondary to bleeding risk), ESRD (on HD MWF), Dyslipidemia, GERD, HTN, and IBS presents on 1/29 with 3 week report history of cough with yellow-green sputum. Diagnosised with Sepsis secondary to LLL HCAP and influenza A, sputum culture +MRSA, urine strep pneumo antigen +. Completed course of Oseltamivir, received cefepime and changed to vancomycin on 2/7. Over course patient has increased mucous production and a CT of Chest that reveals multilobar collapse of left lung due to possible mucous impaction. She was seen by SLP 01/21/17 who determined airway protection appeared adequate and recommended regular diet with thin liquids. Noted high risk of aspiration pneumonia due to microaspriation of oral flora or gastric contents given chronic bedbound status, poor missing dentition, inability to tolerate upright positioning and history of use of reflux mediations. Received order for re-evaluation of swallowing function. RN reports patient has been tolerating current diet, medications without overt difficulty.   Assessment / Plan / Recommendation Clinical Impression  Findings are consistent with initial evaluation: pt demonstrates adequate airway protection with swallowing. She tolerates thin liquids via cup and straw in excess of 3oz, as well as trials of pureed and regular solids with no signs of aspiration. Given prior noted bedbound status, poor dentition, and history of reflux patient remains at severe risk for aspiration pneumonia due to microaspiration of oral flora or gastric contents. Patient reports she and nursing staff have been completing thorough oral care regularly. Recommend continuing regular diet with thin liquids, no further SLP needs identified at this time. SLP will sign off.     Aspiration Risk  Mild aspiration risk    Diet Recommendation Regular;Thin liquid    Liquid Administration via: Cup;Straw Medication Administration: Whole meds with liquid Supervision: Patient able to self feed;Comment (assist to sit upright, complete oral care) Compensations: Slow rate;Small sips/bites Postural Changes: Seated upright at 90 degrees    Other  Recommendations Oral Care Recommendations: Oral care before and after PO;Patient independent with oral care (assist with setup) Other Recommendations: Have oral suction available   Follow up Recommendations None      Frequency and Duration            Prognosis Prognosis for Safe Diet Advancement: Good      Swallow Study   General HPI: 81 year old female with PMH of Afib (not on anticoagulation, secondary to bleeding risk), ESRD (on HD MWF), Dyslipidemia, GERD, HTN, and IBS presents on 1/29 with 3 week report history of cough with yellow-green sputum. Diagnosised with Sepsis secondary to LLL HCAP and influenza A, sputum culture +MRSA, urine strep pneumo antigen +. Completed course of Oseltamivir, received cefepime and changed to vancomycin on 2/7. Over course patient has increased mucous production and a CT of Chest that reveals multilobar collapse of left lung due to possible mucous impaction. She was seen by SLP 01/21/17 who determined airway protection appeared adequate  and recommended regular diet with thin liquids. Noted high risk of aspiration pneumonia due to microaspriation of oral flora or gastric contents given chronic bedbound status, poor missing dentition, inability to tolerate upright positioning and history of use of reflux mediations. Received order for re-evaluation of swallowing function. RN reports patient has been tolerating current diet, medications without overt difficulty. Type of Study: Bedside Swallow Evaluation Previous Swallow Assessment: see HPI Diet Prior to this Study: Regular;Thin liquids Temperature Spikes Noted: No Respiratory Status: Nasal cannula History of Recent Intubation:  No Behavior/Cognition: Alert;Cooperative;Pleasant mood Oral Cavity Assessment: Within Functional Limits Oral Care Completed by SLP: Yes Oral Cavity - Dentition: Dentures, top;Dentures, bottom Vision: Functional for self-feeding Self-Feeding Abilities: Able to feed self Patient Positioning: Upright in bed Baseline Vocal Quality: Normal Volitional Cough: Strong;Congested Volitional Swallow: Able to elicit    Oral/Motor/Sensory Function Overall Oral Motor/Sensory Function: Within functional limits   Ice Chips Ice chips: Within functional limits Presentation: Spoon   Thin Liquid Thin Liquid: Within functional limits Presentation: Cup;Straw;Self Fed    Nectar Thick Nectar Thick Liquid: Not tested   Honey Thick Honey Thick Liquid: Not tested   Puree Puree: Within functional limits Presentation: Self Fed;Spoon   Solid   GO   Solid: Within functional limits Presentation: [place], [place] CF-SLP Speech-Language Pathologist [place] 01/31/2017,1:13 PM

## 2017-01-31 NOTE — Progress Notes (Signed)
[facility] Progress Note   Subjective: Very weak, frail. HD yesterday with even UF d/t low BP. Does not want to stop HD yet   Vitals:   01/31/17 0400 01/31/17 0800 01/31/17 0826 01/31/17 1146  BP: (!) 103/37 114/68    Pulse: 77 71    Resp: 20 18    Temp: 97.5 F (36.4 C) 97.8 F (36.6 C)    TempSrc: Oral Oral    SpO2: 98% 100% 98% 98%  Weight: 74.8 kg (164 lb 14.5 oz)     Height:        Inpatient medications: . acetylcysteine  4 mL Nebulization Q4H  . albuterol  2.5 mg Nebulization Q4H  . allopurinol  100 mg Oral Daily  . amiodarone  200 mg Oral Daily  . aspirin  325 mg Oral Daily  . darbepoetin (ARANESP) injection - DIALYSIS  60 mcg Intravenous Q Fri-HD  . feeding supplement  1 Container Oral TID BM  . guaiFENesin  600 mg Oral BID  . heparin  5,000 Units Subcutaneous Q8H  . insulin aspart  0-5 Units Subcutaneous QHS  . insulin aspart  0-9 Units Subcutaneous TID WC  . mouth rinse  15 mL Mouth Rinse BID  . megestrol  200 mg Oral Daily  . midodrine  10 mg Oral Q M,W,F-HD  . multivitamin  1 tablet Oral QHS  . pantoprazole  40 mg Oral Daily  . sodium chloride HYPERTONIC  4 mL Nebulization Q6H  . vancomycin  750 mg Intravenous Q M,W,F-HD    acetaminophen **OR** acetaminophen, albuterol, diazepam, guaiFENesin-dextromethorphan, ondansetron **OR** ondansetron (ZOFRAN) IV  Exam: Frail, elderly WF, NAD No jvd Diminished Chest coarse rhonchi bilat, no ^wob RRR no mrg Abd soft ntnd +bs No LE edema LUE AVG+bruit Neuro gen'd weakness, minimal LE motor strength (chronic)  Dialysis: MWF AF 4h  72.5kg   2/2.25 bath  Hep 3300  LUA AVG No meds Labs: hb 11 tfs 22%pth 180  P 3 Access Hx - PTA 80 - 90% outflo ax vein stenosis and 70% venous stenosis 12/19/17- post procedure AF 600s which dropped to 300s twice in January (this was her pre procedure AF range- kinetics however great)     Assessment: 1. PNA/ sepsis/ flu A+ - sp 5d course IV abx for HCAP. BCx neg/  Abx changed to IV vanc on 2/7  2. ESRD MWF HD 3. Volume - no gross excess 4. Hypotension - chronic on midodrine 5. Anemia - Hb down 7-8s, started ESA here darbe 60/wk dosed 2/9 Hb 11 as OP. Fe/ TIBC ordered 6. MBD binder on hold for low P 7. Nutrition - low alb 1.6.  Regular diet, vitamins, etc.   8. ^LFT's - Hep panel neg, improving 9. Pafib- on amiodarone  10. Chronic debility/ FTT - living in SNF 3 yrs , WC/bedbound, requires assist for all transfers.    Plan - Next HD Monday  Lynnda Child PA-C [facility] Pager [phone] 01/31/2017,12:46 PM    Recent Labs Lab 01/26/17 0915 01/28/17 0331 01/28/17 0900 01/29/17 0357  NA 134* 135 133* 134*  K 3.8 3.5 3.5 3.8  CL 99* 96* 97* 96*  CO2 27 29 28 30   GLUCOSE 118* 117* 122* 84  BUN 28* 16 11 6   CREATININE 3.42* 2.92* 1.99* 1.45*  CALCIUM 7.6* 7.3* 7.2* 7.3*  PHOS 1.7*  --  1.3*  --     Recent Labs Lab 01/25/17 0446  01/28/17 0331 01/28/17 0900 01/29/17 0357  AST 139*  --  142*  --  145*  ALT 98*  --  108*  --  113*  ALKPHOS 90  --  102  --  105  BILITOT 0.8  --  1.0  --  0.8  PROT 5.1*  --  5.1*  --  5.2*  ALBUMIN 1.6*  < > 1.5* 1.5* 1.5*  < > = values in this interval not displayed.  Recent Labs Lab 01/26/17 0915 01/28/17 0900 01/29/17 0357  WBC 8.0 7.2 8.2  HGB 7.8* 7.6* 8.0*  HCT 24.7* 23.8* 25.2*  MCV 97.2 96.7 97.7  PLT 138* 167 182   Iron/TIBC/Ferritin/ %Sat    Component Value Date/Time   IRON 59 01/26/2017 1515   TIBC NOT CALCULATED 01/26/2017 1515   FERRITIN 29 10/27/2014 0435   IRONPCTSAT NOT CALCULATED 01/26/2017 1515   IRONPCTSAT 22 09/06/2008 1552

## 2017-02-01 DIAGNOSIS — J15212 Pneumonia due to Methicillin resistant Staphylococcus aureus: Secondary | ICD-10-CM

## 2017-02-01 DIAGNOSIS — R945 Abnormal results of liver function studies: Secondary | ICD-10-CM

## 2017-02-01 DIAGNOSIS — R7989 Other specified abnormal findings of blood chemistry: Secondary | ICD-10-CM

## 2017-02-01 LAB — GLUCOSE, CAPILLARY
GLUCOSE-CAPILLARY: 121 mg/dL — AB (ref 65–99)
GLUCOSE-CAPILLARY: 157 mg/dL — AB (ref 65–99)
Glucose-Capillary: 115 mg/dL — ABNORMAL HIGH (ref 65–99)
Glucose-Capillary: 184 mg/dL — ABNORMAL HIGH (ref 65–99)
Glucose-Capillary: 153 mg/dL — ABNORMAL HIGH (ref 65–99)

## 2017-02-01 MED ORDER — METOPROLOL TARTRATE 12.5 MG HALF TABLET
12.5000 mg | ORAL_TABLET | Freq: Two times a day (BID) | ORAL | Status: DC
  Administered 2017-02-01 – 2017-02-03 (×4): 12.5 mg via ORAL
  Filled 2017-02-01 (×4): qty 1

## 2017-02-01 NOTE — Progress Notes (Signed)
[facility] Progress Note   Subjective: last CXR a little better, L lung partially expanded. No edema. No new c/o's.   Vitals:   02/01/17 0000 02/01/17 0435 02/01/17 0436 02/01/17 0804  BP: 96/63 (!) 127/41  (!) 122/49  Pulse: 82 76  85  Resp: (!) 23 17  18   Temp: 97.9 F (36.6 C) 98.2 F (36.8 C)  98.2 F (36.8 C)  TempSrc: Oral Oral  Oral  SpO2: 100% 99%  98%  Weight:   74.5 kg (164 lb 3.9 oz)   Height:        Inpatient medications: . acetylcysteine  4 mL Nebulization TID  . albuterol  2.5 mg Nebulization TID  . allopurinol  100 mg Oral Daily  . amiodarone  200 mg Oral Daily  . aspirin  325 mg Oral Daily  . darbepoetin (ARANESP) injection - DIALYSIS  60 mcg Intravenous Q Fri-HD  . feeding supplement  1 Container Oral TID BM  . guaiFENesin  600 mg Oral BID  . heparin  5,000 Units Subcutaneous Q8H  . insulin aspart  0-5 Units Subcutaneous QHS  . insulin aspart  0-9 Units Subcutaneous TID WC  . mouth rinse  15 mL Mouth Rinse BID  . megestrol  200 mg Oral Daily  . midodrine  10 mg Oral Q M,W,F-HD  . multivitamin  1 tablet Oral QHS  . pantoprazole  40 mg Oral Daily  . vancomycin  750 mg Intravenous Q M,W,F-HD    acetaminophen **OR** acetaminophen, albuterol, diazepam, guaiFENesin-dextromethorphan, ondansetron **OR** ondansetron (ZOFRAN) IV  Exam: Frail, elderly WF, NAD No jvd Diminished Chest coarse rhonchi bilat, no ^wob RRR no mrg Abd soft ntnd +bs 2+ dependent hip edema bilat LUE AVG+bruit Neuro gen'd weakness, minimal LE motor strength (chronic)  Dialysis: MWF AF 4h  72.5kg   2/2.25 bath  Hep 3300  LUA AVG No meds Labs: hb 11 tfs 22%pth 180  P 3 Access Hx - PTA 80 - 90% outflo ax vein stenosis and 70% venous stenosis 12/19/17- post procedure AF 600s which dropped to 300s twice in January (this was her pre procedure AF range- kinetics however great)     Assessment: 1. PNA/ sepsis/ flu A+ - sp 5d course IV abx for HCAP. BCx neg/ Abx changed  to IV vanc on 2/7  2. ESRD MWF HD 3. Volume - +depend edema, clear CXR 4. Hypotension - chronic on midodrine 5. Anemia - Hb down 7-8s, started ESA here darbe 60/wk dosed 2/9 Hb 11 as OP. Fe/ TIBC ordered 6. MBD binder on hold for low P 7. Nutrition - low alb 1.6.  Regular diet, vitamins, etc.   8. ^LFT's - Hep panel neg, improving 9. Pafib- on amiodarone  10. Chronic debility/ bedbound x yrs - living in SNF >2 years,  requires assist for all transfers.  11. EOL - full code, pall care meeting with family   Plan - HD Monday, get vol down  Lynnda Child PA-C [place] Pager [phone] 02/01/2017,9:18 AM    Recent Labs Lab 01/26/17 0915 01/28/17 0331 01/28/17 0900 01/29/17 0357  NA 134* 135 133* 134*  K 3.8 3.5 3.5 3.8  CL 99* 96* 97* 96*  CO2 27 29 28 30   GLUCOSE 118* 117* 122* 84  BUN 28* 16 11 6   CREATININE 3.42* 2.92* 1.99* 1.45*  CALCIUM 7.6* 7.3* 7.2* 7.3*  PHOS 1.7*  --  1.3*  --     Recent Labs Lab 01/28/17 0331 01/28/17  0900 01/29/17 0357  AST 142*  --  145*  ALT 108*  --  113*  ALKPHOS 102  --  105  BILITOT 1.0  --  0.8  PROT 5.1*  --  5.2*  ALBUMIN 1.5* 1.5* 1.5*    Recent Labs Lab 01/26/17 0915 01/28/17 0900 01/29/17 0357  WBC 8.0 7.2 8.2  HGB 7.8* 7.6* 8.0*  HCT 24.7* 23.8* 25.2*  MCV 97.2 96.7 97.7  PLT 138* 167 182   Iron/TIBC/Ferritin/ %Sat    Component Value Date/Time   IRON 59 01/26/2017 1515   TIBC NOT CALCULATED 01/26/2017 1515   FERRITIN 29 10/27/2014 0435   IRONPCTSAT NOT CALCULATED 01/26/2017 1515   IRONPCTSAT 22 09/06/2008 1552

## 2017-02-01 NOTE — Progress Notes (Signed)
PROGRESS NOTE  [name] James GYB:[id number] DOB: [birth year] DOA: 01/19/2017 PCP: [name], MD  HPI/Recap of past 24 hours:  PT is very frail but oriented x3, c/o cough Grandson at bedside  Assessment/Plan: Principal Problem:   Sepsis ([place]) Active Problems:   Essential hypertension   Anemia   ESRD (end stage renal disease) ([place])   Paroxysmal atrial fibrillation ([place])   Controlled type 2 diabetes mellitus with diabetic polyneuropathy, with long-term current use of insulin ([place])   HCAP (healthcare-associated pneumonia)   Pressure ulcer   Advance care planning   Goals of care, counseling/discussion   Palliative care by specialist  Sepsis from lobar pneumonia:  Patient meets sepsis criteria on admission given tachypnea, leukocytosis, and evidence of organ dysfunction.  Source of infection left lower lobe pna and influenza A.   Sputum Culture + MRSA,  urine strep pneumo antigen +, legionella antigen negative   -she finished treatment with Oseltamivir renal dosing, - she received vanc/aztreonam/zithro on day of admission, abx changed to cefepime since admission, vanc was stopped due to mrsa screen negative, Pt transitioned to levaquin on 2/3,  -her sputum culture was collected on 2/3, +mrsa that is resistant to cipro, abx changed to vanc on 2/7.  Left lung collapse: likely from mucus plugging, aggressive chest PT, pulmonology/critical care consulted who recommend continue aggressive chest PT, per pulmonology bronchoscopy is a futile practice.   Repeat cxr on 2/10 with some improvement.  H/o Hypertension: presented with hypotension/sepsis midodrine for intradialytic hypotension -patient presented with hypotension, sbp in 80's initial on presentation,  - home meds betablocker has been on hold since admission due to hypotension, -blood pressure has improved on 2/11, restart lopressor with holding parametes   ESRD on HD MWF:  -Continue midodrine with dialysis -  nephrology consulted and assisting - difficult to remove fluids due to hypotension during dialysis, nephrology recommended palliative care consult. Family want full scope of treatment.  Elevated LFTs: No abdominal pain or nausea.  hepatitis panel negative,  - u/s  "Previous cholecystectomy. Normal calibered common bile duct but mild prominence of the pancreatic duct of uncertain etiology. Heterogeneous hepatic echotexture without discrete mass or intrahepatic ductal dilation. If the elevated liver function studies remain increased, hepatic protocol MRI would be a useful next imaging step." Elevated lft Presumably is from sepsis/influenza/fluids overload, congestion. She has also been on amiodarone chronically -I have discussed this findings with the daughter and patient , they prefer to continue amiodarone for now   pAF:  CHADS2Vasc 7.  Not on warfarin because of risk of bleeding. -Continue amiodarone -Continue aspirin 325mg    Chronic diastolic CHF: -Strict I/Os - volume control with dialysis, but difficult to pull fluids off during dialysis  Right pleural effusion, volume managed by HD.  Anemia: hgb 7.6, no sign of bleeding, transfuse if hgb less than 7   noninsulin dependent Diabetes/hyperglycemia:  - a1c 5.7 - currently on ssi  Other medications:  -Continue allopurinol and PPI  Pressure ulcer, presented on admission -Stage 2 Pressure injury sacrum with non blanchable redness over the buttocks.  presented on admission.  Patient is incontinent of stool, so the redness also can be related to MASD  Wound care input appreciated  FTT: baseline bed to wheelchair bound, need lift for transfer for the last two years  DVT prophylaxis: Lovenox   Code Status: full  Family Communication: patient    Disposition Plan: very weak, left lung collapse, blood pressure to low to remove fluids during dialysis, poor prognosis,  Consultants:  Nephrology  Pulmonology/critical  care  Palliative care  Procedures:  Dialysis MWF  Antibiotics:  Tamiflu Vanc/aztreonam/zithromax in the ED, cefepime than levaquin then vanc  Objective: BP (!) 122/49 (BP Location: Right Arm)   Pulse 85   Temp 98.2 F (36.8 C) (Oral)   Resp 18   Ht 5\' 3"  (1.6 m)   Wt 74.5 kg (164 lb 3.9 oz)   SpO2 100%   BMI 29.09 kg/m   Intake/Output Summary (Last 24 hours) at 02/01/17 1132 Last data filed at 02/01/17 0747  Gross per 24 hour  Intake              750 ml  Output                0 ml  Net              750 ml   Filed Weights   01/30/17 1312 01/31/17 0400 02/01/17 0436  Weight: 75 kg (165 lb 5.5 oz) 74.8 kg (164 lb 14.5 oz) 74.5 kg (164 lb 3.9 oz)    Exam:   General:  Very Frail, pale, aaox3  Cardiovascular: RRR, no rubs  Respiratory: decreased breath sounds, +rhonchi, crackles, left lower lobe. Better aeration right lung. No wheezing  Abdomen: Soft/ND/NT, positive BS  Musculoskeletal: no edema, Chronic foot drop, more on the left  Neuro: aaox3  Data Reviewed: Basic Metabolic Panel:  Recent Labs Lab 01/26/17 0915 01/28/17 0331 01/28/17 0900 01/29/17 0357  NA 134* 135 133* 134*  K 3.8 3.5 3.5 3.8  CL 99* 96* 97* 96*  CO2 27 29 28 30   GLUCOSE 118* 117* 122* 84  BUN 28* 16 11 6   CREATININE 3.42* 2.92* 1.99* 1.45*  CALCIUM 7.6* 7.3* 7.2* 7.3*  PHOS 1.7*  --  1.3*  --    Liver Function Tests:  Recent Labs Lab 01/26/17 0915 01/28/17 0331 01/28/17 0900 01/29/17 0357  AST  --  142*  --  145*  ALT  --  108*  --  113*  ALKPHOS  --  102  --  105  BILITOT  --  1.0  --  0.8  PROT  --  5.1*  --  5.2*  ALBUMIN 1.6* 1.5* 1.5* 1.5*   No results for input(s): LIPASE, AMYLASE in the last 168 hours. No results for input(s): AMMONIA in the last 168 hours. CBC:  Recent Labs Lab 01/26/17 0915 01/28/17 0900 01/29/17 0357  WBC 8.0 7.2 8.2  HGB 7.8* 7.6* 8.0*  HCT 24.7* 23.8* 25.2*  MCV 97.2 96.7 97.7  PLT 138* 167 182   Cardiac Enzymes:     Recent Labs Lab 01/29/17 0357  CKTOTAL 996*   BNP (last 3 results)  Recent Labs  01/20/17 0634 01/25/17 1742  BNP 451.4* 214.8*    ProBNP (last 3 results) No results for input(s): PROBNP in the last 8760 hours.  CBG:  Recent Labs Lab 01/31/17 1249 01/31/17 1741 01/31/17 2041 02/01/17 0649 02/01/17 0831  GLUCAP 109* 164* 144* 121* 115*    Recent Results (from the past 240 hour(s))  Culture, expectorated sputum-assessment     Status: None   Collection Time: 01/24/17  2:23 PM  Result Value Ref Range Status   Specimen Description EXPECTORATED SPUTUM  Final   Special Requests NONE  Final   Sputum evaluation THIS SPECIMEN IS ACCEPTABLE FOR SPUTUM CULTURE  Final   Report Status 01/24/2017 FINAL  Final  Culture, respiratory (NON-Expectorated)     Status: None  Collection Time: 01/24/17  2:23 PM  Result Value Ref Range Status   Specimen Description EXPECTORATED SPUTUM  Final   Special Requests NONE Reflexed from [id number]  Final   Gram Stain   Final    ABUNDANT WBC PRESENT, PREDOMINANTLY PMN MODERATE GRAM POSITIVE COCCI IN PAIRS MODERATE GRAM POSITIVE COCCI IN CHAINS FEW GRAM NEGATIVE RODS FEW YEAST    Culture   Final    MODERATE METHICILLIN RESISTANT STAPHYLOCOCCUS AUREUS   Report Status 01/27/2017 FINAL  Final   Organism ID, Bacteria METHICILLIN RESISTANT STAPHYLOCOCCUS AUREUS  Final      Susceptibility   Methicillin resistant staphylococcus aureus - MIC*    CIPROFLOXACIN >=8 RESISTANT Resistant     ERYTHROMYCIN >=8 RESISTANT Resistant     GENTAMICIN <=0.5 SENSITIVE Sensitive     OXACILLIN >=4 RESISTANT Resistant     TETRACYCLINE <=1 SENSITIVE Sensitive     VANCOMYCIN <=0.5 SENSITIVE Sensitive     TRIMETH/SULFA >=320 RESISTANT Resistant     CLINDAMYCIN <=0.25 SENSITIVE Sensitive     RIFAMPIN <=0.5 SENSITIVE Sensitive     Inducible Clindamycin NEGATIVE Sensitive     * MODERATE METHICILLIN RESISTANT STAPHYLOCOCCUS AUREUS     Studies: No results  found.  Scheduled Meds: . acetylcysteine  4 mL Nebulization TID  . albuterol  2.5 mg Nebulization TID  . allopurinol  100 mg Oral Daily  . amiodarone  200 mg Oral Daily  . aspirin  325 mg Oral Daily  . darbepoetin (ARANESP) injection - DIALYSIS  60 mcg Intravenous Q Fri-HD  . feeding supplement  1 Container Oral TID BM  . guaiFENesin  600 mg Oral BID  . heparin  5,000 Units Subcutaneous Q8H  . insulin aspart  0-5 Units Subcutaneous QHS  . insulin aspart  0-9 Units Subcutaneous TID WC  . mouth rinse  15 mL Mouth Rinse BID  . megestrol  200 mg Oral Daily  . midodrine  10 mg Oral Q M,W,F-HD  . multivitamin  1 tablet Oral QHS  . pantoprazole  40 mg Oral Daily  . vancomycin  750 mg Intravenous Q M,W,F-HD    Continuous Infusions:   Time spent: 68mins  Jaeliana Lococo MD, PhD  [facility] Pager [phone] If 7PM-7AM, please contact night-coverage at [email or web address], password [facility] 02/01/2017, 11:32 AM  LOS: 13 days

## 2017-02-01 NOTE — Progress Notes (Signed)
Daily Progress Note   Patient Name: [name] James       Date: 02/01/2017 DOB: 1936-07-15  Age: 81 y.o. MRN#: [id number] Attending Physician: Florencia Reasons, MD Primary Care Physician: [name], MD Admit Date: 01/19/2017  Reason for Consultation/Follow-up: Establishing goals of care  Subjective: Met with patient this morning. She is tired from coughing so much. Feels weak, but says she feels much better today than yesterday. 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 She recalls being placed on a ventilator a few years ago. She "did not mind" it and would want that again if needed. However, she says if she is able to go back to SNF and feels like they aren't able to care for her, then she would want Hospice. She ate a good amount of her breakfast this morning and enjoyed her eggs. She is very religious, talks about God seeing her through, and that she hasn't been through as much as Jesus went through. Agrees to Chaplain visit.  Review of Systems  Constitutional: Positive for malaise/fatigue.  Respiratory: Positive for cough and sputum production.     Length of Stay: 13  Current Medications: Scheduled Meds:  . acetylcysteine  4 mL Nebulization TID  . albuterol  2.5 mg Nebulization TID  . allopurinol  100 mg Oral Daily  . amiodarone  200 mg Oral Daily  . aspirin  325 mg Oral Daily  . darbepoetin (ARANESP) injection - DIALYSIS  60 mcg Intravenous Q Fri-HD  . feeding supplement  1 Container Oral TID BM  . guaiFENesin  600 mg Oral BID  . heparin  5,000 Units Subcutaneous Q8H  . insulin aspart  0-5 Units Subcutaneous QHS  . insulin aspart  0-9 Units Subcutaneous TID WC  . mouth rinse  15 mL Mouth Rinse BID  .  megestrol  200 mg Oral Daily  . midodrine  10 mg Oral Q M,W,F-HD  . multivitamin  1 tablet Oral QHS  . pantoprazole  40 mg Oral Daily  . vancomycin  750 mg Intravenous Q M,W,F-HD    Continuous Infusions:   PRN Meds: acetaminophen **OR** acetaminophen, albuterol, diazepam, guaiFENesin-dextromethorphan, ondansetron **OR** ondansetron (ZOFRAN) IV  Physical Exam  Constitutional: She is oriented to person, place,  and time.  frail  Cardiovascular: Normal rate and regular rhythm.   Pulmonary/Chest: Effort normal. She has wheezes. She has rales.  Musculoskeletal:  Generalized weakness  Neurological: She is alert and oriented to person, place, and time.            Vital Signs: BP (!) 122/49 (BP Location: Right Arm)   Pulse 85   Temp 98.2 F (36.8 C) (Oral)   Resp 18   Ht '5\' 3"'  (1.6 m)   Wt 74.5 kg (164 lb 3.9 oz)   SpO2 100%   BMI 29.09 kg/m  SpO2: SpO2: 100 % O2 Device: O2 Device: Nasal Cannula O2 Flow Rate: O2 Flow Rate (L/min): 0.5 L/min  Intake/output summary:  Intake/Output Summary (Last 24 hours) at 02/01/17 1038 Last data filed at 01/31/17 1800  Gross per 24 hour  Intake              480 ml  Output                0 ml  Net              480 ml   LBM: Last BM Date: 02/01/17 Baseline Weight: Weight: 73.4 kg (161 lb 13.1 oz) Most recent weight: Weight: 74.5 kg (164 lb 3.9 oz)       Palliative Assessment/Data: PPS: 30%    Flowsheet Rows   Flowsheet Row Most Recent Value  Intake Tab  Referral Department  Hospitalist  Unit at Time of Referral  Intermediate Care Unit  Palliative Care Primary Diagnosis  Sepsis/Infectious Disease  Date Notified  01/27/17  Palliative Care Type  New Palliative care  Reason for referral  Clarify Goals of Care  Date of Admission  01/19/17  # of days IP prior to Palliative referral  8  Clinical Assessment  Psychosocial & Spiritual Assessment  Palliative Care Outcomes      Patient Active Problem List   Diagnosis Date Noted  .  Advance care planning   . Goals of care, counseling/discussion   . Palliative care by specialist   . Sepsis ([place]) 01/19/2017  . HCAP (healthcare-associated pneumonia) 01/19/2017  . Pressure ulcer 01/19/2017  . Long term current use of amiodarone 10/17/2016  . History of pressure ulcer 10/17/2016  . GAD (generalized anxiety disorder) 10/17/2016  . Osteopenia determined by x-ray 04/17/2016  . Controlled type 2 diabetes mellitus with diabetic polyneuropathy, with long-term current use of insulin ([place]) 04/17/2016  . Type 2 diabetes mellitus with diabetic neuropathy ([place]) 06/08/2015  . Type 2 diabetes, controlled, with renal manifestation ([place]) 06/08/2015  . PVD (peripheral vascular disease) ([place]) 06/08/2015  . Swelling of limb-Left arm 04/10/2015  . Nausea with vomiting 01/12/2015  . AP (abdominal pain) 01/12/2015  . Normocytic anemia 01/12/2015  . Paroxysmal atrial fibrillation ([place]) 12/22/2014  . End stage renal disease ([place])   . Melena   . ESRD on dialysis ([place]) 12/19/2014  . CHF (congestive heart failure) ([place]) 12/19/2014  . Chest pain 12/19/2014  . Palpitation 12/19/2014  . ESRD (end stage renal disease) ([place]) 12/19/2014  . Anemia, chronic disease 12/13/2014  . Hemodialysis-associated hypotension 12/13/2014  . Acute renal failure syndrome ([place])   . Pulmonary edema 11/10/2014  . Physical deconditioning 10/29/2014  . Iron deficiency anemia   . Acute esophagitis 10/24/2014  . Gastritis and gastroduodenitis 10/24/2014  . Heme positive stool 10/24/2014  . Acute encephalopathy 10/21/2014  . Morbid obesity ([place]) 10/18/2014  . Edema 10/18/2014  . AKI (acute kidney injury) ([place])  10/17/2014  . Acute on chronic diastolic CHF (congestive heart failure) ([place]) 10/17/2014  . Essential hypertension 10/17/2014  . CKD (chronic kidney disease), stage III 10/17/2014  . DM type 2, uncontrolled, with renal complications ([place]) 36/11/2448  . Dyslipidemia 10/17/2014  . Anemia 10/17/2014     Palliative Care Assessment & Plan   Patient Profile:  81 y.o. female  with past medical history of ESRD (on HD MWF), DM, HTN, Afib  admitted on 01/19/2017 with HCAP. Further workup reveals influenza A positive, pneumococcal pneumonia, MRSA + sputum, sepsis. Palliative medicine consulted for [place].    Assessment/Recommendations/Plan   Continue full scope of care  Pt and family desire full aggressive care  Goals are for patient to return to SNF- however, I am concerned that she is going to take a sudden rapid decline in light of lung disease, sepsis in setting of poor nutritional status (Albumin 1.5) and poor functional status- will continue to follow and address [place]   PMT will continue to follow  Goals of Care and Additional Recommendations:  Limitations on Scope of Treatment: Full Scope Treatment  Code Status:  Full code  Prognosis:  < 6 months  - if she survives this hospitalization Discharge Planning:  To Be Determined  Care plan was discussed with patient, patient's RN.  Thank you for allowing the Palliative Medicine Team to assist in the care of this patient.   Total time: 45 minutes  Greater than 50%  of this time was spent counseling and coordinating care related to the above assessment and plan.  Mariana Kaufman, AGNP-C Palliative Medicine   Please contact Palliative Medicine Team phone at [phone] for questions and concerns.

## 2017-02-02 LAB — RENAL FUNCTION PANEL
ANION GAP: 10 (ref 5–15)
Albumin: 1.5 g/dL — ABNORMAL LOW (ref 3.5–5.0)
BUN: 26 mg/dL — ABNORMAL HIGH (ref 6–20)
CHLORIDE: 99 mmol/L — AB (ref 101–111)
CO2: 25 mmol/L (ref 22–32)
Calcium: 7.6 mg/dL — ABNORMAL LOW (ref 8.9–10.3)
Creatinine, Ser: 3.39 mg/dL — ABNORMAL HIGH (ref 0.44–1.00)
GFR calc non Af Amer: 12 mL/min — ABNORMAL LOW (ref >60)
GFR, EST AFRICAN AMERICAN: 14 mL/min — AB (ref >60)
Glucose, Bld: 144 mg/dL — ABNORMAL HIGH (ref 65–99)
Phosphorus: 3.5 mg/dL (ref 2.5–4.6)
Potassium: 3.7 mmol/L (ref 3.5–5.1)
Sodium: 134 mmol/L — ABNORMAL LOW (ref 135–145)

## 2017-02-02 LAB — CBC
HCT: 25.1 % — ABNORMAL LOW (ref 36.0–46.0)
HEMOGLOBIN: 7.9 g/dL — AB (ref 12.0–15.0)
MCH: 30.7 pg (ref 26.0–34.0)
MCHC: 31.5 g/dL (ref 30.0–36.0)
MCV: 97.7 fL (ref 78.0–100.0)
Platelets: 262 10*3/uL (ref 150–400)
RBC: 2.57 MIL/uL — AB (ref 3.87–5.11)
RDW: 22.5 % — ABNORMAL HIGH (ref 11.5–15.5)
WBC: 8.6 10*3/uL (ref 4.0–10.5)

## 2017-02-02 LAB — GLUCOSE, CAPILLARY
GLUCOSE-CAPILLARY: 107 mg/dL — AB (ref 65–99)
Glucose-Capillary: 108 mg/dL — ABNORMAL HIGH (ref 65–99)
Glucose-Capillary: 137 mg/dL — ABNORMAL HIGH (ref 65–99)
Glucose-Capillary: 145 mg/dL — ABNORMAL HIGH (ref 65–99)
Glucose-Capillary: 126 mg/dL — ABNORMAL HIGH (ref 65–99)

## 2017-02-02 MED ORDER — LIDOCAINE-PRILOCAINE 2.5-2.5 % EX CREA: 1.0000 "application " | TOPICAL_CREAM | CUTANEOUS | Status: DC | PRN

## 2017-02-02 MED ORDER — HEPARIN SODIUM (PORCINE) 1000 UNIT/ML DIALYSIS: 1000.0000 [IU] | INTRAMUSCULAR | Status: DC | PRN

## 2017-02-02 MED ORDER — SODIUM CHLORIDE 0.9 % IV SOLN: 100.0000 mL | INTRAVENOUS | Status: DC | PRN

## 2017-02-02 MED ORDER — HEPARIN SODIUM (PORCINE) 1000 UNIT/ML DIALYSIS: 3300.0000 [IU] | Freq: Once | INTRAMUSCULAR | Status: DC

## 2017-02-02 MED ORDER — VANCOMYCIN HCL IN DEXTROSE 750-5 MG/150ML-% IV SOLN
INTRAVENOUS | Status: AC
  Administered 2017-02-02: 750 mg via INTRAVENOUS
  Filled 2017-02-02: qty 150

## 2017-02-02 MED ORDER — ALTEPLASE 2 MG IJ SOLR: 2.0000 mg | Freq: Once | INTRAMUSCULAR | Status: DC | PRN

## 2017-02-02 MED ORDER — LIDOCAINE HCL (PF) 1 % IJ SOLN: 5.0000 mL | INTRAMUSCULAR | Status: DC | PRN

## 2017-02-02 MED ORDER — PENTAFLUOROPROP-TETRAFLUOROETH EX AERO: 1.0000 "application " | INHALATION_SPRAY | CUTANEOUS | Status: DC | PRN

## 2017-02-02 NOTE — Progress Notes (Signed)
PROGRESS NOTE  [name] James YNW:[id number] DOB: 11-Jan-1936 DOA: 01/19/2017 PCP: [name], MD  HPI/Recap of past 24 hours:  PT is very frail but oriented x3, report feeling better Daughter at bedside  Assessment/Plan: Principal Problem:   Sepsis ([place]) Active Problems:   Essential hypertension   Anemia   ESRD (end stage renal disease) ([place])   Paroxysmal atrial fibrillation ([place])   Controlled type 2 diabetes mellitus with diabetic polyneuropathy, with long-term current use of insulin ([place])   HCAP (healthcare-associated pneumonia)   Pressure ulcer   Advance care planning   Goals of care, counseling/discussion   Palliative care by specialist   LFT elevation   Pneumonia of left lung due to methicillin resistant Staphylococcus aureus (MRSA) ([place])  Sepsis from lobar pneumonia:  Patient meets sepsis criteria on admission given tachypnea, leukocytosis, and evidence of organ dysfunction.  Source of infection left lower lobe pna and influenza A.   Sputum Culture + MRSA,  urine strep pneumo antigen +, legionella antigen negative   -she finished treatment with Oseltamivir renal dosing, - she received vanc/aztreonam/zithro on day of admission, abx changed to cefepime since admission, vanc was stopped due to mrsa screen negative, Pt transitioned to levaquin on 2/3,  -her sputum culture was collected on 2/3, +mrsa that is resistant to cipro, abx changed to vanc on 2/7.  Left lung collapse: likely from mucus plugging, aggressive chest PT, pulmonology/critical care consulted who recommend continue aggressive chest PT, per pulmonology bronchoscopy is a futile practice.   Repeat cxr on 2/10 with some improvement.  H/o Hypertension: presented with hypotension/sepsis midodrine for intradialytic hypotension -patient presented with hypotension, sbp in 80's initial on presentation,  - home meds betablocker has been on hold since admission due to hypotension, -blood pressure has improved  on 2/11, restart lopressor with holding parametes   ESRD on HD MWF:  -Continue midodrine with dialysis - nephrology consulted and assisting - difficult to remove fluids due to hypotension during dialysis, nephrology recommended palliative care consult. Family want full scope of treatment.  Elevated LFTs: No abdominal pain or nausea.  hepatitis panel negative,  - u/s  "Previous cholecystectomy. Normal calibered common bile duct but mild prominence of the pancreatic duct of uncertain etiology. Heterogeneous hepatic echotexture without discrete mass or intrahepatic ductal dilation. If the elevated liver function studies remain increased, hepatic protocol MRI would be a useful next imaging step." Elevated lft Presumably is from sepsis/influenza/fluids overload, congestion. She has also been on amiodarone chronically -I have discussed this findings with the daughter and patient , they prefer to continue amiodarone for now   pAF:  CHADS2Vasc 7.  Not on warfarin because of risk of bleeding. -Continue amiodarone -Continue aspirin 325mg    Chronic diastolic CHF: -Strict I/Os - volume control with dialysis, but difficult to pull fluids off during dialysis  Right pleural effusion, volume managed by HD.  Anemia: hgb 7.6, no sign of bleeding, transfuse if hgb less than 7   noninsulin dependent Diabetes/hyperglycemia:  - a1c 5.7 - currently on ssi  Other medications:  -Continue allopurinol and PPI  Pressure ulcer, presented on admission -Stage 2 Pressure injury sacrum with non blanchable redness over the buttocks.  presented on admission.  Patient is incontinent of stool, so the redness also can be related to MASD  Wound care input appreciated  FTT: baseline bed to wheelchair bound, need lift for transfer for the last two years  DVT prophylaxis: Lovenox   Code Status: full  Family Communication: patient  And  daughter at bedside  Disposition Plan: SNF, hopefully on  2/13 Patient need to be able to sit up during HD, she also needs ongoing  chest PT   Consultants:  Nephrology  Pulmonology/critical care  Palliative care  Procedures:  Dialysis MWF  Antibiotics:  Tamiflu Vanc/aztreonam/zithromax in the ED, cefepime than levaquin then vanc  Objective: BP (!) 91/43   Pulse 78   Temp 98.2 F (36.8 C)   Resp 16   Ht 5\' 3"  (1.6 m)   Wt 77.1 kg (169 lb 15.6 oz)   SpO2 100%   BMI 30.11 kg/m   Intake/Output Summary (Last 24 hours) at 02/02/17 1757 Last data filed at 02/02/17 1300  Gross per 24 hour  Intake              720 ml  Output                0 ml  Net              720 ml   Filed Weights   02/01/17 0436 02/02/17 0342 02/02/17 1429  Weight: 74.5 kg (164 lb 3.9 oz) 77.1 kg (169 lb 15.6 oz) 77.1 kg (169 lb 15.6 oz)    Exam:   General:  Very Frail, pale, aaox3  Cardiovascular: RRR, no rubs  Respiratory:  left lung exam has improved,  Better aeration compare to before,  right lung unremarkable. No wheezing  Abdomen: Soft/ND/NT, positive BS  Musculoskeletal: no edema, Chronic foot drop, more on the left  Neuro: aaox3  Data Reviewed: Basic Metabolic Panel:  Recent Labs Lab 01/28/17 0331 01/28/17 0900 01/29/17 0357 02/02/17 1455  NA 135 133* 134* 134*  K 3.5 3.5 3.8 3.7  CL 96* 97* 96* 99*  CO2 29 28 30 25   GLUCOSE 117* 122* 84 144*  BUN 16 11 6  26*  CREATININE 2.92* 1.99* 1.45* 3.39*  CALCIUM 7.3* 7.2* 7.3* 7.6*  PHOS  --  1.3*  --  3.5   Liver Function Tests:  Recent Labs Lab 01/28/17 0331 01/28/17 0900 01/29/17 0357 02/02/17 1455  AST 142*  --  145*  --   ALT 108*  --  113*  --   ALKPHOS 102  --  105  --   BILITOT 1.0  --  0.8  --   PROT 5.1*  --  5.2*  --   ALBUMIN 1.5* 1.5* 1.5* 1.5*   No results for input(s): LIPASE, AMYLASE in the last 168 hours. No results for input(s): AMMONIA in the last 168 hours. CBC:  Recent Labs Lab 01/28/17 0900 01/29/17 0357 02/02/17 1456  WBC 7.2 8.2 8.6   HGB 7.6* 8.0* 7.9*  HCT 23.8* 25.2* 25.1*  MCV 96.7 97.7 97.7  PLT 167 182 262   Cardiac Enzymes:    Recent Labs Lab 01/29/17 0357  CKTOTAL 996*   BNP (last 3 results)  Recent Labs  01/20/17 0634 01/25/17 1742  BNP 451.4* 214.8*    ProBNP (last 3 results) No results for input(s): PROBNP in the last 8760 hours.  CBG:  Recent Labs Lab 02/01/17 1816 02/01/17 2136 02/02/17 0820 02/02/17 1130 02/02/17 1141  GLUCAP 153* 157* 108* 145* 137*    Recent Results (from the past 240 hour(s))  Culture, expectorated sputum-assessment     Status: None   Collection Time: 01/24/17  2:23 PM  Result Value Ref Range Status   Specimen Description EXPECTORATED SPUTUM  Final   Special Requests NONE  Final   Sputum evaluation THIS  SPECIMEN IS ACCEPTABLE FOR SPUTUM CULTURE  Final   Report Status 01/24/2017 FINAL  Final  Culture, respiratory (NON-Expectorated)     Status: None   Collection Time: 01/24/17  2:23 PM  Result Value Ref Range Status   Specimen Description EXPECTORATED SPUTUM  Final   Special Requests NONE Reflexed from [id number]  Final   Gram Stain   Final    ABUNDANT WBC PRESENT, PREDOMINANTLY PMN MODERATE GRAM POSITIVE COCCI IN PAIRS MODERATE GRAM POSITIVE COCCI IN CHAINS FEW GRAM NEGATIVE RODS FEW YEAST    Culture   Final    MODERATE METHICILLIN RESISTANT STAPHYLOCOCCUS AUREUS   Report Status 01/27/2017 FINAL  Final   Organism ID, Bacteria METHICILLIN RESISTANT STAPHYLOCOCCUS AUREUS  Final      Susceptibility   Methicillin resistant staphylococcus aureus - MIC*    CIPROFLOXACIN >=8 RESISTANT Resistant     ERYTHROMYCIN >=8 RESISTANT Resistant     GENTAMICIN <=0.5 SENSITIVE Sensitive     OXACILLIN >=4 RESISTANT Resistant     TETRACYCLINE <=1 SENSITIVE Sensitive     VANCOMYCIN <=0.5 SENSITIVE Sensitive     TRIMETH/SULFA >=320 RESISTANT Resistant     CLINDAMYCIN <=0.25 SENSITIVE Sensitive     RIFAMPIN <=0.5 SENSITIVE Sensitive     Inducible Clindamycin NEGATIVE  Sensitive     * MODERATE METHICILLIN RESISTANT STAPHYLOCOCCUS AUREUS     Studies: No results found.  Scheduled Meds: . acetylcysteine  4 mL Nebulization TID  . albuterol  2.5 mg Nebulization TID  . allopurinol  100 mg Oral Daily  . amiodarone  200 mg Oral Daily  . aspirin  325 mg Oral Daily  . darbepoetin (ARANESP) injection - DIALYSIS  60 mcg Intravenous Q Fri-HD  . feeding supplement  1 Container Oral TID BM  . guaiFENesin  600 mg Oral BID  . [START ON 02/03/2017] heparin  3,300 Units Dialysis Once in dialysis  . heparin  5,000 Units Subcutaneous Q8H  . insulin aspart  0-5 Units Subcutaneous QHS  . insulin aspart  0-9 Units Subcutaneous TID WC  . mouth rinse  15 mL Mouth Rinse BID  . megestrol  200 mg Oral Daily  . metoprolol tartrate  12.5 mg Oral BID  . midodrine  10 mg Oral Q M,W,F-HD  . multivitamin  1 tablet Oral QHS  . pantoprazole  40 mg Oral Daily  . vancomycin  750 mg Intravenous Q M,W,F-HD    Continuous Infusions:   Time spent: 39mins  Staysha Truby MD, PhD  [facility] Pager [phone] If 7PM-7AM, please contact night-coverage at [email or web address], password [facility] 02/02/2017, 5:57 PM  LOS: 14 days

## 2017-02-02 NOTE — Progress Notes (Signed)
[facility] Progress Note   Subjective: Needs to come to HD in a chair.    Vitals:   02/02/17 0341 02/02/17 0342 02/02/17 0345 02/02/17 0817  BP: (!) 97/40  (!) 110/42 (!) 110/53  Pulse: 70  70   Resp: 18  18   Temp: 97.5 F (36.4 C)   98.2 F (36.8 C)  TempSrc: Axillary   Oral  SpO2: 100%  100%   Weight:  77.1 kg (169 lb 15.6 oz)    Height:        Inpatient medications: . acetylcysteine  4 mL Nebulization TID  . albuterol  2.5 mg Nebulization TID  . allopurinol  100 mg Oral Daily  . amiodarone  200 mg Oral Daily  . aspirin  325 mg Oral Daily  . darbepoetin (ARANESP) injection - DIALYSIS  60 mcg Intravenous Q Fri-HD  . feeding supplement  1 Container Oral TID BM  . guaiFENesin  600 mg Oral BID  . heparin  5,000 Units Subcutaneous Q8H  . insulin aspart  0-5 Units Subcutaneous QHS  . insulin aspart  0-9 Units Subcutaneous TID WC  . mouth rinse  15 mL Mouth Rinse BID  . megestrol  200 mg Oral Daily  . metoprolol tartrate  12.5 mg Oral BID  . midodrine  10 mg Oral Q M,W,F-HD  . multivitamin  1 tablet Oral QHS  . pantoprazole  40 mg Oral Daily  . vancomycin  750 mg Intravenous Q M,W,F-HD    acetaminophen **OR** acetaminophen, albuterol, diazepam, guaiFENesin-dextromethorphan, ondansetron **OR** ondansetron (ZOFRAN) IV  Exam: GEN: Frail, elderly WF, NAD NECK: No jvd PULM Diminished Chest coarse rhonchi bilat, no ^wob CV RRR no mrg ABD soft ntnd +bs Ext 2+ dependent hip edema bilat LUE AVG+bruit Neuro gen'd weakness, minimal LE motor strength (chronic)  Dialysis: MWF AF 4h  72.5kg   2/2.25 bath  Hep 3300  LUA AVG No meds Labs: hb 11 tfs 22%pth 180  P 3 Access Hx - PTA 80 - 90% outflo ax vein stenosis and 70% venous stenosis 12/19/16- post procedure AF 600s which dropped to 300s twice in January (this was her pre procedure AF range- kinetics however great)     Assessment: 1. PNA/ sepsis/ flu A+ - sp 5d course IV abx for HCAP. BCx neg/ Abx changed to  IV vanc on 2/7 as sputum culture with MRSA resistant to cipro.   2. ESRD MWF Hd, plan for today 3. Volume - +depend edema, clear CXR 4. Hypotension - chronic on midodrine 5. Anemia - Hb down 7-8s, started ESA here darbe 60/wk dosed 2/9 Hb 11 as OP. Fe/ TIBC ordered 6. MBD binder on hold for low P 7. Nutrition - low alb 1.6.  Regular diet, vitamins, etc.  Cannot tolerate prostat or nepro, suggested whey protein powder 8. ^LFT's - Hep panel neg, improving 9. Pafib- on amiodarone  10. Chronic debility/ bedbound x yrs - living in SNF >2 years,  requires assist for all transfers.  11. EOL - full code, pall care meeting with family   Plan - HD Monday, get vol down  Lynnda Child PA-C [place] Pager [phone] 02/02/2017,9:24 AM    Recent Labs Lab 01/28/17 0331 01/28/17 0900 01/29/17 0357  NA 135 133* 134*  K 3.5 3.5 3.8  CL 96* 97* 96*  CO2 29 28 30   GLUCOSE 117* 122* 84  BUN 16 11 6   CREATININE 2.92* 1.99* 1.45*  CALCIUM 7.3* 7.2* 7.3*  PHOS  --  1.3*  --     Recent Labs Lab 01/28/17 0331 01/28/17 0900 01/29/17 0357  AST 142*  --  145*  ALT 108*  --  113*  ALKPHOS 102  --  105  BILITOT 1.0  --  0.8  PROT 5.1*  --  5.2*  ALBUMIN 1.5* 1.5* 1.5*    Recent Labs Lab 01/28/17 0900 01/29/17 0357  WBC 7.2 8.2  HGB 7.6* 8.0*  HCT 23.8* 25.2*  MCV 96.7 97.7  PLT 167 182   Iron/TIBC/Ferritin/ %Sat    Component Value Date/Time   IRON 59 01/26/2017 1515   TIBC NOT CALCULATED 01/26/2017 1515   FERRITIN 29 10/27/2014 0435   IRONPCTSAT NOT CALCULATED 01/26/2017 1515   IRONPCTSAT 22 09/06/2008 1552

## 2017-02-02 NOTE — Progress Notes (Signed)
   02/02/17 1334  Clinical Encounter Type  Visited With Patient and family together  Visit Type Initial  Referral From Patient  Consult/Referral To Chaplain  Spiritual Encounters  Spiritual Needs Prayer  Pt. Not available at this time, will recommend a follow-up, medical team visiting with patient and family.  Chaplain Maysel Mccolm A. Vaniyah Lansky , MA-PC , BA-REL/PHIL,  [phone]

## 2017-02-02 NOTE — Progress Notes (Signed)
Pharmacy Antibiotic Note [name] James is a 81 y.o. female admitted on 01/19/2017 that is currently on vancomycin IV for MRSA pneumonia.   Day #6 of vancomycin for MRSA PNA. Afebrile, WBC wnl. Next HD session today.  Plan: Continue vancomycin 750mg  IV qHD-MWF Monitor clinical picture, HD sessions, VT prn F/U LOT (Consider 8 days?)  Height: 5\' 3"  (160 cm) Weight: 169 lb 15.6 oz (77.1 kg) IBW/kg (Calculated) : 52.4  Temp (24hrs), Avg:98.1 F (36.7 C), Min:97.5 F (36.4 C), Max:98.4 F (36.9 C)   Recent Labs Lab 01/28/17 0331 01/28/17 0900 01/29/17 0357  WBC  --  7.2 8.2  CREATININE 2.92* 1.99* 1.45*    Estimated Creatinine Clearance: 30.9 mL/min (by C-G formula based on SCr of 1.45 mg/dL (H)).    Allergies  Allergen Reactions  . Erythromycin Swelling  . Erythromycin Base     Other reaction(s): GI Upset (intolerance)  . Penicillins Other (See Comments)    Unknown allergic reaction.  She has taken keflex and Rocephin many times without problems  . Codeine Rash and Palpitations    Other reaction(s): GI Upset (intolerance)  . Penicillin G Rash   Antimicrobials this admission:  Azith 1/29 x1 Aztreonam 1/29 x1 Vanc 1/29>>2/2; 2/7>> Tamiflu 1/29>>2/5 Cefepime 1/30>>2/2 LVQ 2/2 >>2/6  Microbiology results:  1/29 BCx: ngtd 1/29 Flu A PCR: pos  1/30 MRSA PCR: neg 1/31 sputum: neg 1/31 strep urine + 2/3 Resp cxr: MRSA  Elenor Quinones, PharmD, BCPS Clinical Pharmacist Pager [phone] 02/02/2017 10:28 AM    '

## 2017-02-02 NOTE — Progress Notes (Signed)
Dialysis treatment completed.  2500 mL ultrafiltrated and net fluid removal 2000 mL.    Patient status unchanged. Lung sounds diminished to ausculation in all fields. Generalized edema. Cardiac: NSR.  Disconnected lines and removed needles.  Pressure held for 10 minutes and band aid/gauze dressing applied.  Report given to bedside RN, Justice Rocher.

## 2017-02-02 NOTE — Progress Notes (Signed)
Patient arrived to unit per chair.  Reviewed treatment plan and this RN agrees.  Report received from bedside RN, Nadine.  Consent verified.  Patient A & O X 4. Lung sounds diminished to ausculation in all fields. Generalized non pitting edema. Cardiac: NSR, BBB.  Prepped LUAVG with alcohol and cannulated with two 15 gauge needles.  Pulsation of blood noted.  Flushed access well with saline per protocol.  Connected and secured lines and initiated tx at 1441.  UF goal of 4500 mL and net fluid removal of 4000 mL.  Will continue to monitor.  UF goal adjusted during treatment per nephrology - 3500 mL with net of 3 L/

## 2017-02-02 NOTE — Progress Notes (Signed)
CSW continuing to follow for return to [facility] place SNF when pt is stable.   Jorge Ny, LCSW Clinical Social Worker [phone]

## 2017-02-02 NOTE — Procedures (Signed)
Patient seen and examined on Hemodialysis. QB 400 UF goal 3L ; doing HD in the chair.   Treatment adjusted as needed.  Madelon Lips MD [facility] pgr [phone] 3:12 PM

## 2017-02-03 DIAGNOSIS — R7989 Other specified abnormal findings of blood chemistry: Secondary | ICD-10-CM | POA: Diagnosis not present

## 2017-02-03 DIAGNOSIS — R1310 Dysphagia, unspecified: Secondary | ICD-10-CM | POA: Diagnosis not present

## 2017-02-03 DIAGNOSIS — R488 Other symbolic dysfunctions: Secondary | ICD-10-CM | POA: Diagnosis not present

## 2017-02-03 DIAGNOSIS — A499 Bacterial infection, unspecified: Secondary | ICD-10-CM | POA: Diagnosis not present

## 2017-02-03 DIAGNOSIS — I5042 Chronic combined systolic (congestive) and diastolic (congestive) heart failure: Secondary | ICD-10-CM | POA: Diagnosis not present

## 2017-02-03 DIAGNOSIS — E11622 Type 2 diabetes mellitus with other skin ulcer: Secondary | ICD-10-CM | POA: Diagnosis not present

## 2017-02-03 DIAGNOSIS — I480 Paroxysmal atrial fibrillation: Secondary | ICD-10-CM | POA: Diagnosis not present

## 2017-02-03 DIAGNOSIS — N2581 Secondary hyperparathyroidism of renal origin: Secondary | ICD-10-CM | POA: Diagnosis not present

## 2017-02-03 DIAGNOSIS — A4102 Sepsis due to Methicillin resistant Staphylococcus aureus: Secondary | ICD-10-CM | POA: Diagnosis not present

## 2017-02-03 DIAGNOSIS — R5381 Other malaise: Secondary | ICD-10-CM | POA: Diagnosis not present

## 2017-02-03 DIAGNOSIS — S31000A Unspecified open wound of lower back and pelvis without penetration into retroperitoneum, initial encounter: Secondary | ICD-10-CM | POA: Diagnosis not present

## 2017-02-03 DIAGNOSIS — N186 End stage renal disease: Secondary | ICD-10-CM | POA: Diagnosis not present

## 2017-02-03 DIAGNOSIS — A419 Sepsis, unspecified organism: Secondary | ICD-10-CM | POA: Diagnosis not present

## 2017-02-03 DIAGNOSIS — R1312 Dysphagia, oropharyngeal phase: Secondary | ICD-10-CM | POA: Diagnosis not present

## 2017-02-03 DIAGNOSIS — D638 Anemia in other chronic diseases classified elsewhere: Secondary | ICD-10-CM | POA: Diagnosis not present

## 2017-02-03 DIAGNOSIS — R627 Adult failure to thrive: Secondary | ICD-10-CM | POA: Diagnosis not present

## 2017-02-03 DIAGNOSIS — Z992 Dependence on renal dialysis: Secondary | ICD-10-CM | POA: Diagnosis not present

## 2017-02-03 DIAGNOSIS — R6520 Severe sepsis without septic shock: Secondary | ICD-10-CM | POA: Diagnosis not present

## 2017-02-03 DIAGNOSIS — J15212 Pneumonia due to Methicillin resistant Staphylococcus aureus: Secondary | ICD-10-CM | POA: Diagnosis not present

## 2017-02-03 DIAGNOSIS — I120 Hypertensive chronic kidney disease with stage 5 chronic kidney disease or end stage renal disease: Secondary | ICD-10-CM | POA: Diagnosis not present

## 2017-02-03 DIAGNOSIS — E1122 Type 2 diabetes mellitus with diabetic chronic kidney disease: Secondary | ICD-10-CM | POA: Diagnosis not present

## 2017-02-03 DIAGNOSIS — M6281 Muscle weakness (generalized): Secondary | ICD-10-CM | POA: Diagnosis not present

## 2017-02-03 DIAGNOSIS — J189 Pneumonia, unspecified organism: Secondary | ICD-10-CM | POA: Diagnosis not present

## 2017-02-03 DIAGNOSIS — L988 Other specified disorders of the skin and subcutaneous tissue: Secondary | ICD-10-CM | POA: Diagnosis not present

## 2017-02-03 DIAGNOSIS — J101 Influenza due to other identified influenza virus with other respiratory manifestations: Secondary | ICD-10-CM | POA: Diagnosis not present

## 2017-02-03 DIAGNOSIS — D631 Anemia in chronic kidney disease: Secondary | ICD-10-CM | POA: Diagnosis not present

## 2017-02-03 DIAGNOSIS — I953 Hypotension of hemodialysis: Secondary | ICD-10-CM | POA: Diagnosis not present

## 2017-02-03 LAB — GLUCOSE, CAPILLARY
GLUCOSE-CAPILLARY: 112 mg/dL — AB (ref 65–99)
Glucose-Capillary: 141 mg/dL — ABNORMAL HIGH (ref 65–99)
Glucose-Capillary: 174 mg/dL — ABNORMAL HIGH (ref 65–99)

## 2017-02-03 LAB — FERRITIN: FERRITIN: 1362 ng/mL — AB (ref 11–307)

## 2017-02-03 MED ORDER — METOPROLOL TARTRATE 25 MG PO TABS: 12.5000 mg | ORAL_TABLET | Freq: Two times a day (BID) | ORAL | 0 refills | Status: DC

## 2017-02-03 MED ORDER — DICYCLOMINE HCL 20 MG PO TABS: 20.0000 mg | ORAL_TABLET | Freq: Three times a day (TID) | ORAL | 1 refills | Status: DC | PRN

## 2017-02-03 MED ORDER — FLUCONAZOLE 200 MG PO TABS
200.0000 mg | ORAL_TABLET | Freq: Once | ORAL | Status: AC
  Administered 2017-02-03: 200 mg via ORAL
  Filled 2017-02-03: qty 1

## 2017-02-03 MED ORDER — MEGESTROL ACETATE 400 MG/10ML PO SUSP: 200.0000 mg | Freq: Every day | ORAL | 0 refills | Status: DC

## 2017-02-03 MED ORDER — VANCOMYCIN HCL IN DEXTROSE 750-5 MG/150ML-% IV SOLN: 750.0000 mg | INTRAVENOUS | 0 refills | Status: AC

## 2017-02-03 MED ORDER — GUAIFENESIN ER 600 MG PO TB12: 600.0000 mg | ORAL_TABLET | Freq: Two times a day (BID) | ORAL | 0 refills | Status: DC

## 2017-02-03 MED ORDER — BOOST / RESOURCE BREEZE PO LIQD: 1.0000 | Freq: Three times a day (TID) | ORAL | 0 refills | Status: DC

## 2017-02-03 MED ORDER — SACCHAROMYCES BOULARDII 250 MG PO CAPS: 250.0000 mg | ORAL_CAPSULE | Freq: Two times a day (BID) | ORAL | 0 refills | Status: AC

## 2017-02-03 MED ORDER — ACETYLCYSTEINE 20 % IN SOLN: 4.0000 mL | Freq: Three times a day (TID) | RESPIRATORY_TRACT | 12 refills | Status: DC

## 2017-02-03 MED ORDER — ASPIRIN 325 MG PO TBEC: 325.0000 mg | DELAYED_RELEASE_TABLET | Freq: Every day | ORAL | 0 refills | Status: DC

## 2017-02-03 NOTE — Progress Notes (Addendum)
Called report to [facility] spoke to [employer]

## 2017-02-03 NOTE — Progress Notes (Signed)
Pt transferred to [facility] place per PTAR with personal care belongings Family aware of transfer

## 2017-02-03 NOTE — Care Management Important Message (Signed)
Important Message  Patient Details  Name: [name] James MRN: [id number] Date of Birth: [birth year]   Medicare Important Message Given:  Yes    Chezney Huether T, RN 02/03/2017, 3:19 PM

## 2017-02-03 NOTE — Progress Notes (Signed)
Patient will discharge to [facility] Anticipated discharge date:2/13 Family notified: left message for dtr [employer] by PTAR- called at 3:50pm  CSW signing off.  Jorge Ny, LCSW Clinical Social Worker [phone]

## 2017-02-03 NOTE — Care Management Note (Signed)
Case Management Note  Patient Details  Name: [name] James MRN: [id number] Date of Birth: 01-24-1936  Subjective/Objective:   CM called daughter, Kenney Houseman, @ pt's request.  Daughter requested referral to Pulmonary Rehab when pt is discharged.  Attending agrees with referral - CM discussed with Pulmonary Rehab staff who will evaluate pt upon discharge for OP follow-up.                        Expected Discharge Plan:  [place]  In-House Referral:  Clinical Social Work  Discharge planning Services  CM Consult  Status of Service:  Completed, signed off  Girard Cooter, [place] 02/03/2017, 2:45 PM

## 2017-02-03 NOTE — Discharge Summary (Addendum)
Discharge Summary  [name] James NFA:[id number] DOB: 1936-04-13  PCP: [name], MD  Admit date: 01/19/2017 Discharge date: 02/03/2017  Time spent: >85mns, from 9:50 am to 10:30 am  Recommendations for Outpatient Follow-up:  1. F/u with SNF MD for hospital discharge follow up, repeat cbc/bmp/LFT at follow up 2. F/u with cardiology for afib, please discuss with your cardiology about amiodarone use, monitor lft. Statin stopped, patient prefer to continue on amiodarone.  Discharge Diagnoses:  Active Hospital Problems   Diagnosis Date Noted  . Sepsis ([facility] 01/19/2017  . LFT elevation   . Pneumonia of left lung due to methicillin resistant Staphylococcus aureus (MRSA) ([facility]   . Advance care planning   . Goals of care, counseling/discussion   . Palliative care by specialist   . HCAP (healthcare-associated pneumonia) 01/19/2017  . Pressure ulcer 01/19/2017  . Controlled type 2 diabetes mellitus with diabetic polyneuropathy, with long-term current use of insulin ([facility] 04/17/2016  . Paroxysmal atrial fibrillation ([facility] 12/22/2014  . ESRD (end stage renal disease) ([facility] 12/19/2014  . Essential hypertension 10/17/2014  . Anemia 10/17/2014    Resolved Hospital Problems   Diagnosis Date Noted Date Resolved  No resolved problems to display.    Discharge Condition: stable  Diet recommendation: renal diet  Filed Weights   02/02/17 1429 02/02/17 1841 02/03/17 0548  Weight: 77.1 kg (169 lb 15.6 oz) 75.1 kg (165 lb 9.1 oz) 75.2 kg (165 lb 12.8 oz)    History of present illness:  [facility] Chief Complaint: Cough  HPI: [name] SMITHERMANis a 81y.o. female with a past medical history significant for ESRD on HD MWF, IDDM, HTN, Afib not on warfarin because of bleeding risk who presents with cough.  The patient first developed fever/chills and cough productive of yellowish/green sputum about 3 weeks ago.  Was evaluated at her facility and prescribed robitussin  and doxycycline, CXR was normal per daughter, as was lab work.  Then about 1 week ago, she was re-evaluated in the facility, had a CXR that showed a LL opacity, was given Avelox for pneumonia, had reassuring lab work and eval, and seemed to be improving for several days.  Then this morning, daughter called her and her mother sounded "different" and clearly worse.  After her routine HD, she complained of malaise, worsening cough, severe weakness, and thick yellow sputum, and so her daughter brought her to the ER.  ED course: -Temp 97.42F, heart rate 72, respirations 20-24, blood pressure 85/41, pulse oximetry mid 90s on 2 L nasal cannula -Na 136, K 3.9, Cr 1.88 (baseline 2), WBC 12.5K, Hgb 11.2  -INR normal -Lactic acid 1.8 --> 2.2 -CXR still showed a left base opacity -She was given vancomycin and Azactam and TRH were asked to evaluate for HCAP    Hospital Course:  Principal Problem:   Sepsis ([facility] Active Problems:   Essential hypertension   Anemia   ESRD (end stage renal disease) (HCC)   Paroxysmal atrial fibrillation ([facility]   Controlled type 2 diabetes mellitus with diabetic polyneuropathy, with long-term current use of insulin (HCC)   HCAP (healthcare-associated pneumonia)   Pressure ulcer   Advance care planning   Goals of care, counseling/discussion   Palliative care by specialist   LFT elevation   Pneumonia of left lung due to methicillin resistant Staphylococcus aureus (MRSA) ([facility]   Sepsis from lobar pneumonia/influenza A: Patient meets sepsis criteria on admission given tachypnea,leukocytosis, and evidence of organ dysfunction.  Source of infection left  lower lobe pna and influenza A.   Sputum Culture + MRSA,  urine strep pneumo antigen +, legionella antigen negative  -she finished treatment with Oseltamivir renal dosing, - she received vanc/aztreonam/zithro on day of admission, abx changed to cefepime since admission, vanc was stopped due to mrsa screen negative, Pt  transitioned to levaquin on 2/3,  -her sputum culture was collected on 2/3, +mrsa that is resistant to cipro, abx changed to vanc on 2/7, patient is getting vanc during dialysis, last dose on 2/14.  Left lung collapse: likely from mucus plugging, she received aggressive chest PT in the hospital,  per pulmonology bronchoscopy is a futile practice.   Repeat cxr on 2/10 with some improvement. Patient is not in respiratory distress, on 1-2 liter oxygen supplement  H/o Hypertension:presented with hypotension/sepsis midodrine for intradialytic hypotension -patient presented with hypotension, sbp in 80's initial on presentation,  - home meds betablocker has been on hold since admission due to hypotension, -blood pressure has improved on 2/11, restart lopressor with holding parameters (hold lopressor on dialysis day)   ESRD on HD MWF: -- difficult to remove fluids due to hypotension during dialysis, nephrology recommended palliative care consult. Family wants full scope of treatment. --Continue midodrine MWF with dialysis, hold betablocker on dialysis day  Elevated LFTs: (elevated ast/alt, normal alk phos, normal bili, ck also mildly elevated.) No abdominal pain or nausea. hepatitis panel negative,  - u/s  "Previous cholecystectomy. Normal calibered common bile duct but mild prominence of the pancreatic duct of uncertain etiology. Heterogeneous hepatic echotexture without discrete mass or intrahepatic ductal dilation. If the elevated liver function studies remain increased, hepatic protocol MRI would be a useful next imaging step." Elevated lft Presumably is from sepsis/influenza/fluids overload, congestion. She has also been on amiodarone chronically -I have discussed this findings with the daughter and patient , they prefer to continue amiodarone for now. D/c statin.    pAF: CHADS2Vasc 7. Not on warfarin because of risk of bleeding. -Continue amiodarone -Continue aspirin  391m   Chronic diastolic CHF: -Strict I/Os - volume control with dialysis, but difficult to pull fluids off during dialysis  Right pleural effusion, volume managed by HD.  Anemia: hgb 7.6, no sign of bleeding, transfuse if hgb less than 7   noninsulin dependent Diabetes/hyperglycemia: - a1c 5.7 - currently on ssi  Other medications: -Continue allopurinol and PPI  Pressure ulcer, presented on admission -Stage 2 Pressure injury sacrum with non blanchable redness over the buttocks. presented on admission.  Patient is incontinent of stool, so the redness also can be related to MASD  Wound care input appreciated  FTT: baseline bed to wheelchair bound, need lift for transfer for the last two years  DVT prophylaxis while in the hospital:Lovenox  Code Status: full  Family Communication:  daughter over the phone  Disposition Plan: [facility] Patient is able sit up during HD,    Consultants:  Nephrology  Pulmonology/critical care  Palliative care  Procedures:  Dialysis MWF  Antibiotics:  Tamiflu Vanc/aztreonam/zithromax in the ED, cefepime than levaquin then vanc   Discharge Exam: BP (!) 136/119   Pulse 78   Temp 98.3 F (36.8 C) (Oral)   Resp 20   Ht '5\' 3"'  (1.6 m)   Wt 75.2 kg (165 lb 12.8 oz)   SpO2 100%   BMI 29.37 kg/m     General:  Very Frail, pale, aaox3  Cardiovascular: RRR, no rubs  Respiratory:  left lung exam has improved,  Better aeration compare to before,  right lung unremarkable. No wheezing  Abdomen: Soft/ND/NT, positive BS  Musculoskeletal: no edema, Chronic foot drop, more on the left  Neuro: aaox3   Discharge Instructions You were cared for by a hospitalist during your hospital stay. If you have any questions about your discharge medications or the care you received while you were in the hospital after you are discharged, you can call the unit and asked to speak with the hospitalist on call if the  hospitalist that took care of you is not available. Once you are discharged, your primary care physician will handle any further medical issues. Please note that NO REFILLS for any discharge medications will be authorized once you are discharged, as it is imperative that you return to your primary care physician (or establish a relationship with a primary care physician if you do not have one) for your aftercare needs so that they can reassess your need for medications and monitor your lab values.  Discharge Instructions    Diet - low sodium heart healthy    Complete by:  As directed    Renal diet   For home use only DME oxygen    Complete by:  As directed    Mode or (Route):  Nasal cannula   Liters per Minute:  2   Frequency:  Continuous (stationary and portable oxygen unit needed)   Oxygen delivery system:  Gas   Increase activity slowly    Complete by:  As directed      Allergies as of 02/03/2017      Reactions   Erythromycin Swelling   Erythromycin Base    Other reaction(s): GI Upset (intolerance)   Penicillins Other (See Comments)   Unknown allergic reaction.  She has taken keflex and Rocephin many times without problems   Codeine Rash, Palpitations   Other reaction(s): GI Upset (intolerance)   Penicillin G Rash      Medication List    STOP taking these medications   atorvastatin 80 MG tablet Commonly known as:  LIPITOR   dextromethorphan-guaiFENesin 30-600 MG 12hr tablet Commonly known as:  MUCINEX DM   lanthanum 1000 MG chewable tablet Commonly known as:  FOSRENOL   moxifloxacin 400 MG tablet Commonly known as:  AVELOX   sulfacetamide 10 % ophthalmic ointment Commonly known as:  BLEPH-10     TAKE these medications   acetylcysteine 20 % nebulizer solution Commonly known as:  MUCOMYST Take 4 mLs by nebulization 3 (three) times daily.   albuterol (2.5 MG/3ML) 0.083% nebulizer solution Commonly known as:  PROVENTIL Take 2.5 mg by nebulization. 6AM, 2PM, 10PM x1  week   allopurinol 100 MG tablet Commonly known as:  ZYLOPRIM Take 100 mg by mouth daily.   amiodarone 200 MG tablet Commonly known as:  PACERONE Take 200 mg by mouth daily.   aspirin 325 MG EC tablet Take 1 tablet (325 mg total) by mouth daily. Start taking on:  02/04/2017   cetirizine 10 MG tablet Commonly known as:  ZYRTEC Take 5 mg by mouth at bedtime.   diazepam 5 MG tablet Commonly known as:  VALIUM Take one tablet by mouth every night at bedtime for rest   dicyclomine 20 MG tablet Commonly known as:  BENTYL Take 1 tablet (20 mg total) by mouth 3 (three) times daily as needed for spasms. What changed:  when to take this  reasons to take this   dorzolamide-timolol 22.3-6.8 MG/ML ophthalmic solution Commonly known as:  COSOPT Place 1 drop into the left eye  2 (two) times daily. Wait 2-5 minutes when giving two eye meds   feeding supplement Liqd Take 1 Container by mouth 3 (three) times daily between meals.   FLONASE ALLERGY RELIEF 50 MCG/ACT nasal spray Generic drug:  fluticasone Place 2 sprays into both nostrils daily.   folic acid 1 MG tablet Commonly known as:  FOLVITE Take 1 mg by mouth daily.   guaiFENesin 600 MG 12 hr tablet Commonly known as:  MUCINEX Take 1 tablet (600 mg total) by mouth 2 (two) times daily.   meclizine 12.5 MG tablet Commonly known as:  ANTIVERT Take 12.5 mg by mouth 2 (two) times daily as needed for dizziness.   megestrol 400 MG/10ML suspension Commonly known as:  MEGACE Take 5 mLs (200 mg total) by mouth daily. Start taking on:  02/04/2017   Melatonin 5 MG Tabs Take 5 mg by mouth at bedtime.   metoprolol tartrate 25 MG tablet Commonly known as:  LOPRESSOR Take 0.5 tablets (12.5 mg total) by mouth 2 (two) times daily. Hold if sbp less than 100 or heart rate less than 50, hold on dialysis day. What changed:  how much to take  when to take this  additional instructions   midodrine 10 MG tablet Commonly known as:   PROAMATINE Take 10 mg by mouth every Monday, Wednesday, and Friday. On dialysis days What changed:  Another medication with the same name was removed. Continue taking this medication, and follow the directions you see here.   NEPHRO-VITE PO Take 1 tablet by mouth at bedtime.   OXYGEN Inhale 2 L/min into the lungs as needed.   promethazine 25 MG tablet Commonly known as:  PHENERGAN Take 25 mg by mouth. Take 1 tablet daily prn and q8h prn   saccharomyces boulardii 250 MG capsule Commonly known as:  FLORASTOR Take 1 capsule (250 mg total) by mouth 2 (two) times daily.   UNABLE TO FIND Med Name: Preparation H CRM Aloe.  Apply to external hemorrhoids BID PRN.   Vancomycin 750-5 MG/150ML-% Soln Commonly known as:  VANCOCIN Inject 150 mLs (750 mg total) into the vein every Monday, Wednesday, and Friday with hemodialysis. Last dose on 2/14 Start taking on:  02/04/2017            Durable Medical Equipment        Start     Ordered   02/03/17 0000  For home use only DME oxygen    Question Answer Comment  Mode or (Route) Nasal cannula   Liters per Minute 2   Frequency Continuous (stationary and portable oxygen unit needed)   Oxygen delivery system Gas      02/03/17 1033     Allergies  Allergen Reactions  . Erythromycin Swelling  . Erythromycin Base     Other reaction(s): GI Upset (intolerance)  . Penicillins Other (See Comments)    Unknown allergic reaction.  She has taken keflex and Rocephin many times without problems  . Codeine Rash and Palpitations    Other reaction(s): GI Upset (intolerance)  . Penicillin G Rash    Contact information for follow-up providers    Sanda Klein, MD Follow up in 3 week(s).   Specialty:  Cardiology Why:  afib, monitor liver function and lung function while on amiodarone.  Contact information: [address] [place] [address] [phone]        continue dialysis MWF Follow up.        f/u with SNF MD for  hospital discharge  follow up,monitor liver function. Follow up.            Contact information for after-discharge care    Destination    [facility] .   Specialty:  Skilled Nursing Facility Contact information: [place] [phone]                   The results of significant diagnostics from this hospitalization (including imaging, microbiology, ancillary and laboratory) are listed below for reference.    Significant Diagnostic Studies: Ct Chest Wo Contrast  Result Date: 01/28/2017 CLINICAL DATA:  Pneumonia, CHF, nonsmoker EXAM: CT CHEST WITHOUT CONTRAST TECHNIQUE: Multidetector CT imaging of the chest was performed following the standard protocol without IV contrast. COMPARISON:  CXR from 01/25/2017 FINDINGS: Cardiovascular: Normal sized cardiac chambers. Aortic atherosclerosis without aneurysm. There is coronary arteriosclerosis. Trace anterior pericardial effusion or thickening. Normal branch pattern of the great vessels. Mediastinum/Nodes: No pathologically enlarged lymph nodes. Trachea is unremarkable. Thyroid gland demonstrates coarse calcification on the right and 9 mm hypodense nodule on the left. Lungs/Pleura: There are soft tissue densities in the distal left mainstem bronchus causing postobstructive atelectasis pulmonary consolidation of the left lung. Slight mediastinal shift to the left due to volume loss. Findings may be secondary to mucous impaction. Small left pleural effusion. 5 - 6 mm pulmonary nodular densities in the right middle lobe, series 201, image 55, 57, and 58. Patchy airspace disease in the right lower lobe consistent with pneumonia. Upper Abdomen: Bilateral breast implants with possible old intracapsular rupture on the left. Cholecystectomy. Cyst off the upper pole the right kidney measuring 1.9 cm. No adrenal mass. Musculoskeletal: Degenerative changes along the dorsal spine. No acute or suspicious osseous  abnormalities. IMPRESSION: 1. Soft tissue densities are noted in the left mainstem bronchus causing multilobar collapse of the left lung. This may be from mucous impaction. Endobronchial lesions are not entirely excluded but given that this is a new recent finding, mucous plugging is believed more likely. Moderate left pleural effusion. 2. Right lower lobe pneumonia with atelectasis. 3. 5 - 6 mm nodular densities in the right middle lobe. Non-contrast chest CT at 3-6 months is recommended. If the nodules are stable at time of repeat CT, then future CT at 18-24 months (from today's scan) is considered optional for low-risk patients, but is recommended for high-risk patients. This recommendation follows the consensus statement: Guidelines for Management of Incidental Pulmonary Nodules Detected on CT Images: From the Fleischner Society 2017; Radiology 2017; 284:228-243. 4. Intracapsular rupture of the left breast implant. 5. Right upper pole renal cyst measuring 1.9 cm. Electronically Signed   By: Ashley Royalty M.D.   On: 01/28/2017 22:06   Dg Chest Port 1 View  Result Date: 01/31/2017 CLINICAL DATA:  Pneumonia EXAM: PORTABLE CHEST 1 VIEW COMPARISON:  01/30/2017 FINDINGS: Left mid and lower lung airspace opacities noted, compatible with pneumonia. Aeration in the left lung as improved since prior study. Probable small to moderate left effusion. No confluent opacity on the right. IMPRESSION: Improving aeration on the left. Continued small to moderate left pleural effusion with left mid and lower lung consolidation. Electronically Signed   By: Rolm Baptise M.D.   On: 01/31/2017 08:49   Dg Chest Port 1 View  Result Date: 01/30/2017 CLINICAL DATA:  Pneumonia EXAM: PORTABLE CHEST 1 VIEW COMPARISON:  Chest radiograph January 25, 2017 and chest CT January 28, 2017 FINDINGS: There is consolidation with effusion throughout the left lung with essentially complete  opacification of the left hemithorax. There is shift of heart  and mediastinum toward the left. The right lung is clear. Heart size appears grossly stable with opacification along the left heart border due to the consolidation and effusion. The pulmonary vascularity on the right appears normal. Pulmonary vascularity on the left is obscured. No adenopathy seen on the right. There is aortic atherosclerosis. No evident bone lesions. IMPRESSION: Opacification of the left hemithorax due to consolidation and pleural effusion, similar to recent CT appearance. Right lung clear. Shift of heart and mediastinum toward the left as noted on CT examination 2 days prior. Aortic atherosclerosis. Electronically Signed   By: Lowella Grip III M.D.   On: 01/30/2017 08:40   Dg Chest Port 1 View  Result Date: 01/25/2017 CLINICAL DATA:  Respiratory distress. Rapid response called. Hx diabetes, HTN, HLD, CHF, A. Fib, never smoker. EXAM: PORTABLE CHEST 1 VIEW COMPARISON:  Chest x-rays dated 01/19/2017 and 12/18/2014. FINDINGS: Heart size is grossly stable, partially obscured by the opacity at the left lung base. Atherosclerotic changes noted at the aortic arch. Central pulmonary vascular congestion and mild bilateral interstitial edema persists. Interval enlargement of the left lower lobe airspace opacity, likely a combination of atelectasis and layering pleural effusion. Pleural effusion is likely moderate in size. Suspect additional small right pleural effusion. IMPRESSION: 1. Central pulmonary vascular congestion and mild bilateral interstitial edema indicating some degree of CHF/volume overload. 2. Increased opacity at the left lung base, likely a combination of atelectasis and layering pleural effusion. Pleural effusion is likely moderate in size. 3. Probable small right pleural effusion. 4. Aortic atherosclerosis. Electronically Signed   By: Franki Cabot M.D.   On: 01/25/2017 17:24   Dg Chest Port 1 View  Result Date: 01/19/2017 CLINICAL DATA:  Pneumonia for 3 weeks, not improved on  antibiotics. EXAM: PORTABLE CHEST 1 VIEW COMPARISON:  12/18/2017 FINDINGS: Left base consolidation, consistent with pneumonia. Right lung is clear. Probable small left pleural effusion. Pulmonary vasculature is normal. Hilar, mediastinal and cardiac contours are unremarkable. IMPRESSION: Left base consolidation and probable small effusion, consistent with pneumonia. Followup PA and lateral chest X-ray is recommended in 3-4 weeks following trial of antibiotic therapy to ensure resolution and exclude underlying malignancy. Electronically Signed   By: Andreas Newport M.D.   On: 01/19/2017 21:28   US Abdomen Limited Ruq  Result Date: 01/21/2017 CLINICAL DATA:  Elevated liver function studies. Previous cholecystectomy. EXAM: US ABDOMEN LIMITED - RIGHT UPPER QUADRANT COMPARISON:  Abdominal and pelvic CT scan of January 22, 2016 FINDINGS: Gallbladder: The gallbladder is surgically absent. Common bile duct: Diameter: 4 mm Liver: The hepatic echotexture is mildly heterogeneous. There is no discrete mass or ductal dilation. The surface contour of the liver is normal. Incidental note is made of a right pleural effusion. There is also a right renal cyst in the upper pole laterally measuring 1.9 x 1.9 x 2.2 cm. The pancreatic duct is prominent measuring 4.4 mm in diameter. IMPRESSION: Previous cholecystectomy. Normal calibered common bile duct but mild prominence of the pancreatic duct of uncertain etiology. Heterogeneous hepatic echotexture without discrete mass or intrahepatic ductal dilation. If the elevated liver function studies remain increased, hepatic protocol MRI would be a useful next imaging step. Right pleural effusion. Electronically Signed   By: David  Martinique M.D.   On: 01/21/2017 09:10    Microbiology: No results found for this or any previous visit (from the past 240 hour(s)).   Labs: Basic Metabolic Panel:  Recent Labs Lab 01/28/17  [id number] 01/28/17 0900 01/29/17 0357 02/02/17 1455  NA 135 133*  134* 134*  K 3.5 3.5 3.8 3.7  CL 96* 97* 96* 99*  CO2 '29 28 30 25  ' GLUCOSE 117* 122* 84 144*  BUN '16 11 6 ' 26*  CREATININE 2.92* 1.99* 1.45* 3.39*  CALCIUM 7.3* 7.2* 7.3* 7.6*  PHOS  --  1.3*  --  3.5   Liver Function Tests:  Recent Labs Lab 01/28/17 0331 01/28/17 0900 01/29/17 0357 02/02/17 1455  AST 142*  --  145*  --   ALT 108*  --  113*  --   ALKPHOS 102  --  105  --   BILITOT 1.0  --  0.8  --   PROT 5.1*  --  5.2*  --   ALBUMIN 1.5* 1.5* 1.5* 1.5*   No results for input(s): LIPASE, AMYLASE in the last 168 hours. No results for input(s): AMMONIA in the last 168 hours. CBC:  Recent Labs Lab 01/28/17 0900 01/29/17 0357 02/02/17 1456  WBC 7.2 8.2 8.6  HGB 7.6* 8.0* 7.9*  HCT 23.8* 25.2* 25.1*  MCV 96.7 97.7 97.7  PLT 167 182 262   Cardiac Enzymes:  Recent Labs Lab 01/29/17 0357  CKTOTAL 996*   BNP: BNP (last 3 results)  Recent Labs  01/20/17 0634 01/25/17 1742  BNP 451.4* 214.8*    ProBNP (last 3 results) No results for input(s): PROBNP in the last 8760 hours.  CBG:  Recent Labs Lab 02/02/17 1141 02/02/17 2004 02/02/17 2203 02/03/17 0805 02/03/17 1245  GLUCAP 137* 107* 126* 112* 141*       Signed:  Jaspreet Bodner MD, PhD  [facility] 02/03/2017, 2:58 PM

## 2017-02-03 NOTE — Progress Notes (Signed)
[facility] Progress Note   Subjective: Successfully went to HD in chair yesterday.  2L UF removed.    Vitals:   02/03/17 0003 02/03/17 0522 02/03/17 0548 02/03/17 0800  BP: (!) 96/50 (!) 96/45  (!) 91/48  Pulse: 80 75  69  Resp: 18 19  20   Temp: 98.6 F (37 C) 98.4 F (36.9 C)  98.3 F (36.8 C)  TempSrc: Oral Oral  Oral  SpO2: 95% 100%  100%  Weight:   75.2 kg (165 lb 12.8 oz)   Height:        Inpatient medications: . acetylcysteine  4 mL Nebulization TID  . albuterol  2.5 mg Nebulization TID  . allopurinol  100 mg Oral Daily  . amiodarone  200 mg Oral Daily  . aspirin  325 mg Oral Daily  . darbepoetin (ARANESP) injection - DIALYSIS  60 mcg Intravenous Q Fri-HD  . feeding supplement  1 Container Oral TID BM  . guaiFENesin  600 mg Oral BID  . heparin  5,000 Units Subcutaneous Q8H  . insulin aspart  0-5 Units Subcutaneous QHS  . insulin aspart  0-9 Units Subcutaneous TID WC  . mouth rinse  15 mL Mouth Rinse BID  . megestrol  200 mg Oral Daily  . metoprolol tartrate  12.5 mg Oral BID  . midodrine  10 mg Oral Q M,W,F-HD  . multivitamin  1 tablet Oral QHS  . pantoprazole  40 mg Oral Daily  . vancomycin  750 mg Intravenous Q M,W,F-HD    acetaminophen **OR** acetaminophen, albuterol, diazepam, guaiFENesin-dextromethorphan, ondansetron **OR** ondansetron (ZOFRAN) IV  Exam: GEN: Frail, elderly, NAD NECK: No jvd PULM Diminished bilaterally. Coarse rhonchi much improved. CV RRR no mrg ABD soft ntnd +bs Ext trace-1+ edema bilaterally, improved LUE AVG+bruit Neuro gen'd weakness, minimal LE motor strength (chronic)  Dialysis: MWF AF 4h  72.5kg   2/2.25 bath  Hep 3300  LUA AVG No meds Labs: hb 11 tfs 22%pth 180  P 3 Access Hx - PTA 80 - 90% outflo ax vein stenosis and 70% venous stenosis 12/19/16- post procedure AF 600s which dropped to 300s twice in January (this was her pre procedure AF range- kinetics however great)     Assessment: 1. PNA/ sepsis/  flu A+ - sp 5d course IV abx for HCAP. BCx neg/ Abx changed to IV vanc on 2/7 as sputum culture with MRSA resistant to cipro.   2. ESRD MWF HD ; 2L UF 2/12, next HD 2/14, continue to come in a chair 3. Volume - +depend edema, clear CXR 4. Hypotension - chronic on midodrine 5. Anemia - Hb down 7-8s, started ESA here darbe 60/wk dosed 2/9 Hb 11 as OP. Fe/ TIBC ordered- transferrin < 70 so incalculable, adding on ferritin 6. MBD binder on hold for low P 7. Nutrition - low alb 1.6.  Regular diet, vitamins, etc.  Cannot tolerate prostat or nepro, suggested whey protein powder 8. ^LFT's - Hep panel neg, improving 9. Pafib- on amiodarone  10. Chronic debility/ bedbound x yrs - living in SNF >2 years,  requires assist for all transfers.  11. EOL - full code, pall care meeting with family has confirmed this.   Madelon Lips MD [facility] pgr [phone] 02/03/2017,9:04 AM    Recent Labs Lab 01/28/17 0900 01/29/17 0357 02/02/17 1455  NA 133* 134* 134*  K 3.5 3.8 3.7  CL 97* 96* 99*  CO2 28 30 25   GLUCOSE 122* 84 144*  BUN 11  6 26*  CREATININE 1.99* 1.45* 3.39*  CALCIUM 7.2* 7.3* 7.6*  PHOS 1.3*  --  3.5    Recent Labs Lab 01/28/17 0331 01/28/17 0900 01/29/17 0357 02/02/17 1455  AST 142*  --  145*  --   ALT 108*  --  113*  --   ALKPHOS 102  --  105  --   BILITOT 1.0  --  0.8  --   PROT 5.1*  --  5.2*  --   ALBUMIN 1.5* 1.5* 1.5* 1.5*    Recent Labs Lab 01/28/17 0900 01/29/17 0357 02/02/17 1456  WBC 7.2 8.2 8.6  HGB 7.6* 8.0* 7.9*  HCT 23.8* 25.2* 25.1*  MCV 96.7 97.7 97.7  PLT 167 182 262   Iron/TIBC/Ferritin/ %Sat    Component Value Date/Time   IRON 59 01/26/2017 1515   TIBC NOT CALCULATED 01/26/2017 1515   FERRITIN 29 10/27/2014 0435   IRONPCTSAT NOT CALCULATED 01/26/2017 1515   IRONPCTSAT 22 09/06/2008 1552

## 2017-02-04 ENCOUNTER — Telehealth: Payer: Self-pay

## 2017-02-04 ENCOUNTER — Non-Acute Institutional Stay (SKILLED_NURSING_FACILITY): Payer: Medicare Other | Admitting: Adult Health

## 2017-02-04 ENCOUNTER — Other Ambulatory Visit: Payer: Self-pay | Admitting: *Deleted

## 2017-02-04 ENCOUNTER — Encounter: Payer: Self-pay | Admitting: Adult Health

## 2017-02-04 DIAGNOSIS — R945 Abnormal results of liver function studies: Secondary | ICD-10-CM

## 2017-02-04 DIAGNOSIS — R5381 Other malaise: Secondary | ICD-10-CM | POA: Diagnosis not present

## 2017-02-04 DIAGNOSIS — D638 Anemia in other chronic diseases classified elsewhere: Secondary | ICD-10-CM | POA: Diagnosis not present

## 2017-02-04 DIAGNOSIS — I953 Hypotension of hemodialysis: Secondary | ICD-10-CM | POA: Diagnosis not present

## 2017-02-04 DIAGNOSIS — D631 Anemia in chronic kidney disease: Secondary | ICD-10-CM | POA: Diagnosis not present

## 2017-02-04 DIAGNOSIS — N2581 Secondary hyperparathyroidism of renal origin: Secondary | ICD-10-CM | POA: Diagnosis not present

## 2017-02-04 DIAGNOSIS — N186 End stage renal disease: Secondary | ICD-10-CM

## 2017-02-04 DIAGNOSIS — A419 Sepsis, unspecified organism: Secondary | ICD-10-CM

## 2017-02-04 DIAGNOSIS — R7989 Other specified abnormal findings of blood chemistry: Secondary | ICD-10-CM

## 2017-02-04 DIAGNOSIS — I480 Paroxysmal atrial fibrillation: Secondary | ICD-10-CM

## 2017-02-04 DIAGNOSIS — A499 Bacterial infection, unspecified: Secondary | ICD-10-CM | POA: Diagnosis not present

## 2017-02-04 DIAGNOSIS — J189 Pneumonia, unspecified organism: Secondary | ICD-10-CM | POA: Diagnosis not present

## 2017-02-04 DIAGNOSIS — I5042 Chronic combined systolic (congestive) and diastolic (congestive) heart failure: Secondary | ICD-10-CM | POA: Diagnosis not present

## 2017-02-04 DIAGNOSIS — J101 Influenza due to other identified influenza virus with other respiratory manifestations: Secondary | ICD-10-CM

## 2017-02-04 DIAGNOSIS — E1122 Type 2 diabetes mellitus with diabetic chronic kidney disease: Secondary | ICD-10-CM | POA: Diagnosis not present

## 2017-02-04 MED ORDER — DIAZEPAM 5 MG PO TABS: ORAL_TABLET | ORAL | 0 refills | Status: DC

## 2017-02-04 NOTE — Telephone Encounter (Signed)
[facility] #[phone] Fax: [phone] 

## 2017-02-04 NOTE — Progress Notes (Signed)
DATE:  02/04/2017   MRN:  [id number]  BIRTHDAY: [birth year]  Facility:  [facility]ursing Home Location:  [place] Room Number: 705-P  LEVEL OF CARE:  SNF (31)  Contact Information    Name Relation Home Work Diagonal Daughter [phone]  [phone]   Mamie Laurel   [phone]   Batsheva, Stevick [phone]         Code Status History    Date Active Date Inactive Code Status Order ID Comments User Context   01/20/2017  1:36 AM 02/03/2017  8:38 PM Full Code [id number]  Edwin Dada, MD ED   12/21/2014  1:14 PM 12/22/2014  4:36 PM DNR [id number]  Sol Blazing, MD Inpatient   12/19/2014  7:00 AM 12/21/2014  1:14 PM Full Code [id number]  Ivor Costa, MD ED   11/12/2014 12:06 PM 12/06/2014  8:35 PM Full Code [id number]  Azzie Roup, MD Inpatient   11/02/2014  4:18 PM 11/12/2014 12:06 PM Full Code [id number]  Ermalinda Memos Inpatient   10/17/2014  5:52 PM 11/02/2014  4:18 PM Full Code [id number]  Jonetta Osgood, MD Inpatient       Chief Complaint  Patient presents with  . Hospitalization Follow-up    HISTORY OF PRESENT ILLNESS:  This is a 50-YO female seen for hospital follow-up.  She was readmitted to [facility] on 02/03/2017 following an admission at [facility] 01/19/2017-02/03/2017 for sepsis from lobar pneumonia and influenza A. Her sputum culture +MRSA and urine strep pneumo antigen + and legionella antigen negative. She has finished treatment with Tamiflu renal dosing. She was given Vancomycin, Aztreonam and Zithro, then changed to Cefepime, then changed to Levaquin then changed to Levaquin. Today is her last dose of Vancomycin @ dialysis. Left lung collapse likely from mucus plugging which is thought to be from mucus plugging. Aggressive chest PT in the hospital. A repeat CXR on 2/10 showed some improvement. LFTs were elevated. Hepatitis panel was negative. Ultrasound showed mild prominence of the  pancreatic duct of uncertain etiology.  She was seen in the room and did not verbalize any concerns.  PAST MEDICAL HISTORY:  Past Medical History:  Diagnosis Date  . Anemia, chronic disease   . Anxiety   . Arthritis    knees  . Atrial fibrillation ([place])   . Barrett esophagus   . Benign paroxysmal positional vertigo   . Breast cancer ([place]) 1980s   bil mastectomies, no radiation or chemo  . Cataract   . Chest pain, atypical 12/05/2008   R/Lmv- normal perfusion all regions, noe ECG changes   . CHF (congestive heart failure) ([place]) 05/13/2011   echo - BH>41%; stage 1 diastolic dysfunction; elevated LV filling pressure, MAC  . CKD (chronic kidney disease), stage III   . Claudication ([place]) 10/30/2005   doppler - normal evaluation, no evidence of aneurysm, diameter reduction, dissection, compression or vascular abnormality)  . Complication of anesthesia   . Dyslipidemia   . Dysrhythmia    Atrial Fibrillation  . Edema   . ESRD (end stage renal disease) ([place])   . Family history of adverse reaction to anesthesia    son also has nausea   . Fever blister 12/2016  . Gastritis and gastroduodenitis   . GERD (gastroesophageal reflux disease)   . Gout   . Hemodialysis-associated hypotension   . HLD (hyperlipidemia)   . Hypertension   . IBS (irritable bowel syndrome)   . Insomnia   .  Morbid obesity ([place])   . PAF (paroxysmal atrial fibrillation) ([place])   . Peripheral neuropathy ([place])   . PONV (postoperative nausea and vomiting)   . Renal cyst   . Renal insufficiency 10/30/2005   doppler - abn resistance consistent w/ parenchymal disease  . Renovascular hypertension   . Type 2 diabetes mellitus with diabetic neuropathy, with long-term current use of insulin ([place])   . Vertigo      CURRENT MEDICATIONS: Reviewed  Patient's Medications  New Prescriptions   No medications on file  Previous Medications   ACETYLCYSTEINE (MUCOMYST) 20 % NEBULIZER SOLUTION    Take 4 mLs by nebulization 3  (three) times daily.   ALBUTEROL (PROVENTIL) (2.5 MG/3ML) 0.083% NEBULIZER SOLUTION    Take 2.5 mg by nebulization. 6AM, 2PM, 10PM x1 week   ALLOPURINOL (ZYLOPRIM) 100 MG TABLET    Take 100 mg by mouth daily.    AMIODARONE (PACERONE) 200 MG TABLET    Take 200 mg by mouth daily.    ASPIRIN EC 325 MG EC TABLET    Take 1 tablet (325 mg total) by mouth daily.   B COMPLEX-C-FOLIC ACID (NEPHRO-VITE PO)    Take 1 tablet by mouth at bedtime.    CETIRIZINE (ZYRTEC) 10 MG TABLET    Take 5 mg by mouth at bedtime.    DIAZEPAM (VALIUM) 5 MG TABLET    Take one tablet by mouth every night at bedtime for rest   DICYCLOMINE (BENTYL) 20 MG TABLET    Take 1 tablet (20 mg total) by mouth 3 (three) times daily as needed for spasms.   DORZOLAMIDE-TIMOLOL (COSOPT) 22.3-6.8 MG/ML OPHTHALMIC SOLUTION    Place 1 drop into the left eye 2 (two) times daily. Wait 2-5 minutes when giving two eye meds   FLUTICASONE (FLONASE ALLERGY RELIEF) 50 MCG/ACT NASAL SPRAY    Place 2 sprays into both nostrils daily.   FOLIC ACID (FOLVITE) 1 MG TABLET    Take 1 mg by mouth daily.   GUAIFENESIN (MUCINEX) 600 MG 12 HR TABLET    Take 1 tablet (600 mg total) by mouth 2 (two) times daily.   MECLIZINE (ANTIVERT) 12.5 MG TABLET    Take 12.5 mg by mouth 2 (two) times daily as needed for dizziness.    MEGESTROL (MEGACE) 400 MG/10ML SUSPENSION    Take 5 mLs (200 mg total) by mouth daily.   MELATONIN 5 MG TABS    Take 5 mg by mouth at bedtime.    METOPROLOL TARTRATE (LOPRESSOR) 25 MG TABLET    Take 0.5 tablets (12.5 mg total) by mouth 2 (two) times daily. Hold if sbp less than 100 or heart rate less than 50, hold on dialysis day.   MIDODRINE (PROAMATINE) 10 MG TABLET    Take 10 mg by mouth every Monday, Wednesday, and Friday. On dialysis days   NUTRITIONAL SUPPLEMENT LIQD    Take 120 mLs by mouth 3 (three) times daily.   OXYGEN    Inhale 2 L/min into the lungs as needed.    PROMETHAZINE (PHENERGAN) 25 MG TABLET    Take 25 mg by mouth every 8 (eight)  hours as needed for nausea or vomiting.    SACCHAROMYCES BOULARDII (FLORASTOR) 250 MG CAPSULE    Take 1 capsule (250 mg total) by mouth 2 (two) times daily.   UNABLE TO FIND    Med Name: Preparation H CRM Al[place].  Apply to external hemorrhoids BID PRN.   VANCOMYCIN (VANCOCIN) 750-5 MG/150ML-% SOLN  Inject 150 mLs (750 mg total) into the vein every Monday, Wednesday, and Friday with hemodialysis. Last dose on 2/14  Modified Medications   No medications on file  Discontinued Medications   FEEDING SUPPLEMENT (BOOST / RESOURCE BREEZE) LIQD    Take 1 Container by mouth 3 (three) times daily between meals.     Allergies  Allergen Reactions  . Erythromycin Swelling  . Erythromycin Base     Other reaction(s): GI Upset (intolerance)  . Penicillins Other (See Comments)    Unknown allergic reaction.  She has taken keflex and Rocephin many times without problems  . Codeine Rash and Palpitations    Other reaction(s): GI Upset (intolerance)  . Penicillin G Rash     REVIEW OF SYSTEMS:  GENERAL: no change in appetite, no fatigue, no weight changes, no fever, chills or weakness SKIN: Denies rash, itching, wounds, ulcer sores, or nail abnormality EYES: Denies change in vision, dry eyes, eye pain, itching or discharge EARS: Denies change in hearing, ringing in ears, or earache NOSE: Denies nasal congestion or epistaxis MOUTH and THROAT: Denies oral discomfort, gingival pain or bleeding, pain from teeth or hoarseness   RESPIRATORY: no cough, SOB, DOE, wheezing, hemoptysis CARDIAC: no chest pain, edema or palpitations GI: no abdominal pain, diarrhea, constipation, heart burn, nausea or vomiting GU: Denies dysuria, frequency, hematuria, incontinence, or discharge PSYCHIATRIC: Denies feeling of depression or anxiety. No report of hallucinations, insomnia, paranoia, or agitation    PHYSICAL EXAMINATION  GENERAL APPEARANCE: Well nourished. In no acute distress. Normal body habitus SKIN:  Skin is  warm and dry.  HEAD: Normal in size and contour. No evidence of trauma EYES: Lids open and close normally. No blepharitis, entropion or ectropion. PERRL. Conjunctivae are clear and sclerae are white. Lenses are without opacity EARS: Pinnae are normal. Patient hears normal voice tunes of the examiner MOUTH and THROAT: Lips are without lesions. Oral mucosa is moist and without lesions. Tongue is normal in shape, size, and color and without lesions NECK: supple, trachea midline, no neck masses, no thyroid tenderness, no thyromegaly LYMPHATICS: no LAN in the neck, no supraclavicular LAN RESPIRATORY: breathing is even & unlabored, BS CTAB, has O2@ 2L/min via [place] CARDIAC: RRR, no murmur,no extra heart sounds, no edema, has left upper arm AV fistula + bruit/thrill GI: abdomen soft, normal BS, no masses, no tenderness, no hepatomegaly, no splenomegaly EXTREMITIES:  Able to move BUE; BLE generalized weakness  PSYCHIATRIC: Alert and oriented X 3. Affect and behavior are appropriate   LABS/RADIOLOGY: Labs reviewed: Basic Metabolic Panel:  Recent Labs  01/26/17 0915  01/28/17 0900 01/29/17 0357 02/02/17 1455  NA 134*  < > 133* 134* 134*  K 3.8  < > 3.5 3.8 3.7  CL 99*  < > 97* 96* 99*  CO2 27  < > 28 30 25   GLUCOSE 118*  < > 122* 84 144*  BUN 28*  < > 11 6 26*  CREATININE 3.42*  < > 1.99* 1.45* 3.39*  CALCIUM 7.6*  < > 7.2* 7.3* 7.6*  PHOS 1.7*  --  1.3*  --  3.5  < > = values in this interval not displayed. Liver Function Tests:  Recent Labs  01/25/17 0446  01/28/17 0331 01/28/17 0900 01/29/17 0357 02/02/17 1455  AST 139*  --  142*  --  145*  --   ALT 98*  --  108*  --  113*  --   ALKPHOS 90  --  102  --  105  --  BILITOT 0.8  --  1.0  --  0.8  --   PROT 5.1*  --  5.1*  --  5.2*  --   ALBUMIN 1.6*  < > 1.5* 1.5* 1.5* 1.5*  < > = values in this interval not displayed. CBC:  Recent Labs  09/04/16 01/06/17 01/19/17 1725  01/28/17 0900 01/29/17 0357 02/02/17 1456  WBC 8.6  8.8 12.5*  < > 7.2 8.2 8.6  NEUTROABS 4 6,274 8.1*  --   --   --   --   HGB 12.9 11.5* 11.2*  < > 7.6* 8.0* 7.9*  HCT 43 36 36.5  < > 23.8* 25.2* 25.1*  MCV  --   --  99.7  < > 96.7 97.7 97.7  PLT 179 170 166  < > 167 182 262  < > = values in this interval not displayed. Lipid Panel:  Recent Labs  05/22/16 10/22/16  HDL 35 29*   Cardiac Enzymes:  Recent Labs  01/21/17 0436 01/29/17 0357  CKTOTAL 982* 996*   CBG:  Recent Labs  02/03/17 0805 02/03/17 1245 02/03/17 1624  GLUCAP 112* 141* 174*      Ct Chest Wo Contrast  Result Date: 01/28/2017 CLINICAL DATA:  Pneumonia, CHF, nonsmoker EXAM: CT CHEST WITHOUT CONTRAST TECHNIQUE: Multidetector CT imaging of the chest was performed following the standard protocol without IV contrast. COMPARISON:  CXR from 01/25/2017 FINDINGS: Cardiovascular: Normal sized cardiac chambers. Aortic atherosclerosis without aneurysm. There is coronary arteriosclerosis. Trace anterior pericardial effusion or thickening. Normal branch pattern of the great vessels. Mediastinum/Nodes: No pathologically enlarged lymph nodes. Trachea is unremarkable. Thyroid gland demonstrates coarse calcification on the right and 9 mm hypodense nodule on the left. Lungs/Pleura: There are soft tissue densities in the distal left mainstem bronchus causing postobstructive atelectasis pulmonary consolidation of the left lung. Slight mediastinal shift to the left due to volume loss. Findings may be secondary to mucous impaction. Small left pleural effusion. 5 - 6 mm pulmonary nodular densities in the right middle lobe, series 201, image 55, 57, and 58. Patchy airspace disease in the right lower lobe consistent with pneumonia. Upper Abdomen: Bilateral breast implants with possible old intracapsular rupture on the left. Cholecystectomy. Cyst off the upper pole the right kidney measuring 1.9 cm. No adrenal mass. Musculoskeletal: Degenerative changes along the dorsal spine. No acute or  suspicious osseous abnormalities. IMPRESSION: 1. Soft tissue densities are noted in the left mainstem bronchus causing multilobar collapse of the left lung. This may be from mucous impaction. Endobronchial lesions are not entirely excluded but given that this is a new recent finding, mucous plugging is believed more likely. Moderate left pleural effusion. 2. Right lower lobe pneumonia with atelectasis. 3. 5 - 6 mm nodular densities in the right middle lobe. Non-contrast chest CT at 3-6 months is recommended. If the nodules are stable at time of repeat CT, then future CT at 18-24 months (from today's scan) is considered optional for low-risk patients, but is recommended for high-risk patients. This recommendation follows the consensus statement: Guidelines for Management of Incidental Pulmonary Nodules Detected on CT Images: From the Fleischner Society 2017; Radiology 2017; 284:228-243. 4. Intracapsular rupture of the left breast implant. 5. Right upper pole renal cyst measuring 1.9 cm. Electronically Signed   By: Ashley Royalty M.D.   On: 01/28/2017 22:06   Dg Chest Port 1 View  Result Date: 01/31/2017 CLINICAL DATA:  Pneumonia EXAM: PORTABLE CHEST 1 VIEW COMPARISON:  01/30/2017 FINDINGS: Left mid and lower lung  airspace opacities noted, compatible with pneumonia. Aeration in the left lung as improved since prior study. Probable small to moderate left effusion. No confluent opacity on the right. IMPRESSION: Improving aeration on the left. Continued small to moderate left pleural effusion with left mid and lower lung consolidation. Electronically Signed   By: Rolm Baptise M.D.   On: 01/31/2017 08:49   Dg Chest Port 1 View  Result Date: 01/30/2017 CLINICAL DATA:  Pneumonia EXAM: PORTABLE CHEST 1 VIEW COMPARISON:  Chest radiograph January 25, 2017 and chest CT January 28, 2017 FINDINGS: There is consolidation with effusion throughout the left lung with essentially complete opacification of the left hemithorax.  There is shift of heart and mediastinum toward the left. The right lung is clear. Heart size appears grossly stable with opacification along the left heart border due to the consolidation and effusion. The pulmonary vascularity on the right appears normal. Pulmonary vascularity on the left is obscured. No adenopathy seen on the right. There is aortic atherosclerosis. No evident bone lesions. IMPRESSION: Opacification of the left hemithorax due to consolidation and pleural effusion, similar to recent CT appearance. Right lung clear. Shift of heart and mediastinum toward the left as noted on CT examination 2 days prior. Aortic atherosclerosis. Electronically Signed   By: Lowella Grip III M.D.   On: 01/30/2017 08:40   Dg Chest Port 1 View  Result Date: 01/25/2017 CLINICAL DATA:  Respiratory distress. Rapid response called. Hx diabetes, HTN, HLD, CHF, A. Fib, never smoker. EXAM: PORTABLE CHEST 1 VIEW COMPARISON:  Chest x-rays dated 01/19/2017 and 12/18/2014. FINDINGS: Heart size is grossly stable, partially obscured by the opacity at the left lung base. Atherosclerotic changes noted at the aortic arch. Central pulmonary vascular congestion and mild bilateral interstitial edema persists. Interval enlargement of the left lower lobe airspace opacity, likely a combination of atelectasis and layering pleural effusion. Pleural effusion is likely moderate in size. Suspect additional small right pleural effusion. IMPRESSION: 1. Central pulmonary vascular congestion and mild bilateral interstitial edema indicating some degree of CHF/volume overload. 2. Increased opacity at the left lung base, likely a combination of atelectasis and layering pleural effusion. Pleural effusion is likely moderate in size. 3. Probable small right pleural effusion. 4. Aortic atherosclerosis. Electronically Signed   By: Franki Cabot M.D.   On: 01/25/2017 17:24   Dg Chest Port 1 View  Result Date: 01/19/2017 CLINICAL DATA:  Pneumonia for 3  weeks, not improved on antibiotics. EXAM: PORTABLE CHEST 1 VIEW COMPARISON:  12/18/2017 FINDINGS: Left base consolidation, consistent with pneumonia. Right lung is clear. Probable small left pleural effusion. Pulmonary vasculature is normal. Hilar, mediastinal and cardiac contours are unremarkable. IMPRESSION: Left base consolidation and probable small effusion, consistent with pneumonia. Followup PA and lateral chest X-ray is recommended in 3-4 weeks following trial of antibiotic therapy to ensure resolution and exclude underlying malignancy. Electronically Signed   By: Andreas Newport M.D.   On: 01/19/2017 21:28   US Abdomen Limited Ruq  Result Date: 01/21/2017 CLINICAL DATA:  Elevated liver function studies. Previous cholecystectomy. EXAM: US ABDOMEN LIMITED - RIGHT UPPER QUADRANT COMPARISON:  Abdominal and pelvic CT scan of January 22, 2016 FINDINGS: Gallbladder: The gallbladder is surgically absent. Common bile duct: Diameter: 4 mm Liver: The hepatic echotexture is mildly heterogeneous. There is no discrete mass or ductal dilation. The surface contour of the liver is normal. Incidental note is made of a right pleural effusion. There is also a right renal cyst in the upper pole laterally measuring  1.9 x 1.9 x 2.2 cm. The pancreatic duct is prominent measuring 4.4 mm in diameter. IMPRESSION: Previous cholecystectomy. Normal calibered common bile duct but mild prominence of the pancreatic duct of uncertain etiology. Heterogeneous hepatic echotexture without discrete mass or intrahepatic ductal dilation. If the elevated liver function studies remain increased, hepatic protocol MRI would be a useful next imaging step. Right pleural effusion. Electronically Signed   By: David  Martinique M.D.   On: 01/21/2017 09:10    ASSESSMENT/PLAN:  Physical deconditioning - for rehabilitation, PT and OT, for therapeutic strengthening exercises; fall precautions  Sepsis from lobar pneumonia and influenza A - Her sputum  culture +MRSA and urine strep pneumo antigen + and legionella antigen negative. She has finished treatment with Tamiflu renal dosing. She was given Vancomycin, Aztreonam and Zithro, then changed to Cefepime, then changed to Levaquin then changed to Vancomycin. Today is her last dose of Vancomycin @ dialysis  HCAP - She was given Vancomycin, Aztreonam and Zithro, then changed to Cefepime, then changed to Levaquin then changed to Vancomycin. Today is her last dose of Vancomycin @ dialysis  Influenza A - S/P Tamiflu renal dosing  Hypotension associated with hemodialysis - continue midodrine  ESRD - on HD MWF, nephrology recommended palliative care consult due to hypotension during dialysis, family wants full scope of treatment; and will hold beta metoprolol tartr 14 ate on dialysis day  Elevated LFTs - ultrasound of liver showed mild prominence of the pancreatic duct of uncertain etiology; elevated LFTs was thought to be from sepsis/influenza/fluids overload and congestion; prefers to continue amiodarone for now and statin was discontinued; check LFTs  PAF  - rate controlled; continue amiodarone, aspirin and metoprolol tartrate ; follow-up with Dr. 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 [place] - NP  [employer] [phone]

## 2017-02-04 NOTE — Telephone Encounter (Signed)
Possible re-admission to facility. This is a patient you were seeing at [facility] . [facility] F/U is needed if patient was re-admitted to facility upon discharge. Hospital discharge from [facility] on 02/03/17.

## 2017-02-06 DIAGNOSIS — E1122 Type 2 diabetes mellitus with diabetic chronic kidney disease: Secondary | ICD-10-CM | POA: Diagnosis not present

## 2017-02-06 DIAGNOSIS — N186 End stage renal disease: Secondary | ICD-10-CM | POA: Diagnosis not present

## 2017-02-06 DIAGNOSIS — D631 Anemia in chronic kidney disease: Secondary | ICD-10-CM | POA: Diagnosis not present

## 2017-02-06 DIAGNOSIS — Z992 Dependence on renal dialysis: Secondary | ICD-10-CM | POA: Diagnosis not present

## 2017-02-06 DIAGNOSIS — A499 Bacterial infection, unspecified: Secondary | ICD-10-CM | POA: Diagnosis not present

## 2017-02-06 DIAGNOSIS — S31000A Unspecified open wound of lower back and pelvis without penetration into retroperitoneum, initial encounter: Secondary | ICD-10-CM | POA: Diagnosis not present

## 2017-02-06 DIAGNOSIS — N2581 Secondary hyperparathyroidism of renal origin: Secondary | ICD-10-CM | POA: Diagnosis not present

## 2017-02-09 DIAGNOSIS — E1122 Type 2 diabetes mellitus with diabetic chronic kidney disease: Secondary | ICD-10-CM | POA: Diagnosis not present

## 2017-02-09 DIAGNOSIS — A499 Bacterial infection, unspecified: Secondary | ICD-10-CM | POA: Diagnosis not present

## 2017-02-09 DIAGNOSIS — N2581 Secondary hyperparathyroidism of renal origin: Secondary | ICD-10-CM | POA: Diagnosis not present

## 2017-02-09 DIAGNOSIS — N186 End stage renal disease: Secondary | ICD-10-CM | POA: Diagnosis not present

## 2017-02-09 DIAGNOSIS — D631 Anemia in chronic kidney disease: Secondary | ICD-10-CM | POA: Diagnosis not present

## 2017-02-11 DIAGNOSIS — N186 End stage renal disease: Secondary | ICD-10-CM | POA: Diagnosis not present

## 2017-02-11 DIAGNOSIS — N2581 Secondary hyperparathyroidism of renal origin: Secondary | ICD-10-CM | POA: Diagnosis not present

## 2017-02-11 DIAGNOSIS — A499 Bacterial infection, unspecified: Secondary | ICD-10-CM | POA: Diagnosis not present

## 2017-02-11 DIAGNOSIS — E1122 Type 2 diabetes mellitus with diabetic chronic kidney disease: Secondary | ICD-10-CM | POA: Diagnosis not present

## 2017-02-11 DIAGNOSIS — D631 Anemia in chronic kidney disease: Secondary | ICD-10-CM | POA: Diagnosis not present

## 2017-02-12 ENCOUNTER — Ambulatory Visit: Payer: Medicare Other | Admitting: Cardiology

## 2017-02-12 ENCOUNTER — Encounter: Payer: Self-pay | Admitting: *Deleted

## 2017-02-13 DIAGNOSIS — D631 Anemia in chronic kidney disease: Secondary | ICD-10-CM | POA: Diagnosis not present

## 2017-02-13 DIAGNOSIS — N186 End stage renal disease: Secondary | ICD-10-CM | POA: Diagnosis not present

## 2017-02-13 DIAGNOSIS — L988 Other specified disorders of the skin and subcutaneous tissue: Secondary | ICD-10-CM | POA: Diagnosis not present

## 2017-02-13 DIAGNOSIS — N2581 Secondary hyperparathyroidism of renal origin: Secondary | ICD-10-CM | POA: Diagnosis not present

## 2017-02-13 DIAGNOSIS — E11622 Type 2 diabetes mellitus with other skin ulcer: Secondary | ICD-10-CM | POA: Diagnosis not present

## 2017-02-13 DIAGNOSIS — A499 Bacterial infection, unspecified: Secondary | ICD-10-CM | POA: Diagnosis not present

## 2017-02-13 DIAGNOSIS — E1122 Type 2 diabetes mellitus with diabetic chronic kidney disease: Secondary | ICD-10-CM | POA: Diagnosis not present

## 2017-02-13 DIAGNOSIS — Z992 Dependence on renal dialysis: Secondary | ICD-10-CM | POA: Diagnosis not present

## 2017-02-16 DIAGNOSIS — D631 Anemia in chronic kidney disease: Secondary | ICD-10-CM | POA: Diagnosis not present

## 2017-02-16 DIAGNOSIS — N2581 Secondary hyperparathyroidism of renal origin: Secondary | ICD-10-CM | POA: Diagnosis not present

## 2017-02-16 DIAGNOSIS — A499 Bacterial infection, unspecified: Secondary | ICD-10-CM | POA: Diagnosis not present

## 2017-02-16 DIAGNOSIS — N186 End stage renal disease: Secondary | ICD-10-CM | POA: Diagnosis not present

## 2017-02-16 DIAGNOSIS — E1122 Type 2 diabetes mellitus with diabetic chronic kidney disease: Secondary | ICD-10-CM | POA: Diagnosis not present

## 2017-02-18 DIAGNOSIS — Z992 Dependence on renal dialysis: Secondary | ICD-10-CM | POA: Diagnosis not present

## 2017-02-18 DIAGNOSIS — D631 Anemia in chronic kidney disease: Secondary | ICD-10-CM | POA: Diagnosis not present

## 2017-02-18 DIAGNOSIS — N2581 Secondary hyperparathyroidism of renal origin: Secondary | ICD-10-CM | POA: Diagnosis not present

## 2017-02-18 DIAGNOSIS — A499 Bacterial infection, unspecified: Secondary | ICD-10-CM | POA: Diagnosis not present

## 2017-02-18 DIAGNOSIS — E1122 Type 2 diabetes mellitus with diabetic chronic kidney disease: Secondary | ICD-10-CM | POA: Diagnosis not present

## 2017-02-18 DIAGNOSIS — N186 End stage renal disease: Secondary | ICD-10-CM | POA: Diagnosis not present

## 2017-02-20 DIAGNOSIS — N2581 Secondary hyperparathyroidism of renal origin: Secondary | ICD-10-CM | POA: Diagnosis not present

## 2017-02-20 DIAGNOSIS — E1122 Type 2 diabetes mellitus with diabetic chronic kidney disease: Secondary | ICD-10-CM | POA: Diagnosis not present

## 2017-02-20 DIAGNOSIS — N186 End stage renal disease: Secondary | ICD-10-CM | POA: Diagnosis not present

## 2017-02-23 ENCOUNTER — Encounter: Payer: Self-pay | Admitting: Internal Medicine

## 2017-02-23 ENCOUNTER — Non-Acute Institutional Stay (SKILLED_NURSING_FACILITY): Payer: Medicare Other | Admitting: Internal Medicine

## 2017-02-23 DIAGNOSIS — E1122 Type 2 diabetes mellitus with diabetic chronic kidney disease: Secondary | ICD-10-CM | POA: Diagnosis not present

## 2017-02-23 DIAGNOSIS — R531 Weakness: Secondary | ICD-10-CM | POA: Diagnosis not present

## 2017-02-23 DIAGNOSIS — D638 Anemia in other chronic diseases classified elsewhere: Secondary | ICD-10-CM | POA: Diagnosis not present

## 2017-02-23 DIAGNOSIS — I10 Essential (primary) hypertension: Secondary | ICD-10-CM

## 2017-02-23 DIAGNOSIS — IMO0001 Reserved for inherently not codable concepts without codable children: Secondary | ICD-10-CM

## 2017-02-23 DIAGNOSIS — N186 End stage renal disease: Secondary | ICD-10-CM

## 2017-02-23 DIAGNOSIS — Z992 Dependence on renal dialysis: Secondary | ICD-10-CM | POA: Diagnosis not present

## 2017-02-23 DIAGNOSIS — R195 Other fecal abnormalities: Secondary | ICD-10-CM

## 2017-02-23 DIAGNOSIS — E871 Hypo-osmolality and hyponatremia: Secondary | ICD-10-CM

## 2017-02-23 DIAGNOSIS — J1100 Influenza due to unidentified influenza virus with unspecified type of pneumonia: Secondary | ICD-10-CM

## 2017-02-23 DIAGNOSIS — J15212 Pneumonia due to Methicillin resistant Staphylococcus aureus: Secondary | ICD-10-CM

## 2017-02-23 DIAGNOSIS — I480 Paroxysmal atrial fibrillation: Secondary | ICD-10-CM | POA: Diagnosis not present

## 2017-02-23 DIAGNOSIS — N2581 Secondary hyperparathyroidism of renal origin: Secondary | ICD-10-CM | POA: Diagnosis not present

## 2017-02-23 NOTE — Progress Notes (Signed)
LOCATION: [facility]  PCP: [name], MD   Code Status: Full Code  Goals of care: Advanced Directive information Advanced Directives 01/20/2017  Does Patient Have a Medical Advance Directive? No  Would patient like information on creating a medical advance directive? No - Patient declined       Extended Emergency Contact Information Primary Emergency Contact: [facility] Address: [address]          [place], [place] [address] Johnnette Litter of [place] Phone: [phone] Mobile Phone: [phone] Relation: Daughter Secondary Emergency Contact: Cogill,Donnie Address: [place], [place] [address] Johnnette Litter of [place] Mobile Phone: [phone] Relation: Son   Allergies  Allergen Reactions  . Erythromycin Swelling  . Erythromycin Base     Other reaction(s): GI Upset (intolerance)  . Penicillins Other (See Comments)    Unknown allergic reaction.  She has taken keflex and Rocephin many times without problems  . Codeine Rash and Palpitations    Other reaction(s): GI Upset (intolerance)  . Penicillin G Rash    Chief Complaint  Patient presents with  . Readmit To SNF    Readmission Visit      HPI:  Patient is a 81 y.o. female seen today for long term care post hospital re-admission from 01/19/17-02/03/17 with sepsis from left lobar pneumonia and influenza A. She received oseltamivir and antibiotic. She has completed them. She has medical history of HTN, ESRD on HD, afib, CHF among others. She has received diflucan x 1 for vaginal yeast infection. She is seen in her room today.   Review of Systems:  Constitutional: Negative for fever,diaphoresis. Energy level is slowly coming back.  HENT: Negative for headache, congestion, nasal discharge, sore throat, difficulty swallowing.   Eyes: Negative for eye pain, blurred vision, double vision and discharge.  Respiratory: Negative for cough, shortness of breath and wheezing.     Cardiovascular: Negative for chest pain, palpitations, leg swelling.  Gastrointestinal: Negative for heartburn, nausea, vomiting, abdominal pain, loss of appetite. Last bowel movement was yesterday x 2 with loose stool.  Genitourinary: Negative for dysuria Musculoskeletal: Negative for back pain, fall.  Skin: Negative for itching, rash.  Neurological: Negative for weakness. Positive for dizziness.  Psychiatric/Behavioral: Negative for depression.   Past Medical History:  Diagnosis Date  . Anemia, chronic disease   . Anxiety   . Arthritis    knees  . Atrial fibrillation ([place])   . Barrett esophagus   . Benign paroxysmal positional vertigo   . Breast cancer ([place]) 1980s   bil mastectomies, no radiation or chemo  . Cataract   . Chest pain, atypical 12/05/2008   R/Lmv- normal perfusion all regions, noe ECG changes   . CHF (congestive heart failure) ([place]) 05/13/2011   echo - OH>60%; stage 1 diastolic dysfunction; elevated LV filling pressure, MAC  . CKD (chronic kidney disease), stage III   . Claudication ([place]) 10/30/2005   doppler - normal evaluation, no evidence of aneurysm, diameter reduction, dissection, compression or vascular abnormality)  . Complication of anesthesia   . Dyslipidemia   . Dysrhythmia    Atrial Fibrillation  . Edema   . ESRD (end stage renal disease) ([employer])   . Family history of adverse reaction to anesthesia    son also has nausea   . Fever blister 12/2016  . Gastritis and gastroduodenitis   . GERD (gastroesophageal reflux disease)   . Gout   . Hemodialysis-associated hypotension   .  HLD (hyperlipidemia)   . Hypertension   . IBS (irritable bowel syndrome)   . Insomnia   . Morbid obesity ([place])   . PAF (paroxysmal atrial fibrillation) ([place])   . Peripheral neuropathy ([place])   . PONV (postoperative nausea and vomiting)   . Renal cyst   . Renal insufficiency 10/30/2005   doppler - abn resistance consistent w/ parenchymal disease  . Renovascular hypertension    . Type 2 diabetes mellitus with diabetic neuropathy, with long-term current use of insulin ([place])   . Vertigo    Past Surgical History:  Procedure Laterality Date  . ANGIOPLASTY Left 02/12/2015   Procedure: ANGIOPLASTY;  Surgeon: Angelia Mould, MD;  Location: [facility] CATH LAB;  Service: Cardiovascular;  Laterality: Left;  AVF  . AV FISTULA PLACEMENT Left 11/30/2014   Procedure: ARTERIOVENOUS (AV) FISTULA CREATION LEFT ARM;  Surgeon: Angelia Mould, MD;  Location: [facility];  Service: Vascular;  Laterality: Left;  . AV FISTULA PLACEMENT Left 03/20/2015   Procedure:  Inserton of Left Upper Arm Gortex Graft;  Surgeon: Angelia Mould, MD;  Location: [place] [facility];  Service: Vascular;  Laterality: Left;  . AV FISTULA PLACEMENT Left 04/12/2015   Procedure: INSERTION OF LEFT ARM  ARTERIOVENOUS GORE-TEX GRAFT ;  Surgeon: Angelia Mould, MD;  Location: [facility];  Service: Vascular;  Laterality: Left;  . BACK SURGERY  ~1980  . CHOLECYSTECTOMY    . ESOPHAGOGASTRODUODENOSCOPY N/A 10/24/2014   Procedure: ESOPHAGOGASTRODUODENOSCOPY (EGD);  Surgeon: Jerene Bears, MD;  Location: [facility] ENDOSCOPY;  Service: Endoscopy;  Laterality: N/A;  . EXCHANGE OF A DIALYSIS CATHETER Left 11/30/2014   Procedure: EXCHANGE OF A DIALYSIS CATHETER, LEFT INTERNAL JUGULAR;  Surgeon: Angelia Mould, MD;  Location: [facility];  Service: Vascular;  Laterality: Left;  . EYE SURGERY    . FISTULOGRAM N/A 02/12/2015   Procedure: FISTULOGRAM;  Surgeon: Angelia Mould, MD;  Location: [facility] CATH LAB;  Service: Cardiovascular;  Laterality: N/A;  . HEMORRHOID SURGERY    . LIGATION OF ARTERIOVENOUS  FISTULA Left 03/20/2015   Procedure: LIGATION OF ARTERIOVENOUS  FISTULA;  Surgeon: Angelia Mould, MD;  Location: [facility];  Service: Vascular;  Laterality: Left;  Marland Kitchen MASTECTOMY Bilateral ~1982   bil breast reconstruction with implants  . ORIF TIBIA & FIBULA FRACTURES Left 2007   also had left non displaced malleolar fracture.   Marland Kitchen  REMOVAL OF GRAFT Left 04/12/2015   Procedure: REMOVAL OF LEFT ARM ARTERIOVENOUS GORE-TEX GRAFT;  Surgeon: Angelia Mould, MD;  Location: [facility];  Service: Vascular;  Laterality: Left;   Social History:   reports that she has never smoked. She has never used smokeless tobacco. She reports that she does not drink alcohol or use drugs.  Family History  Problem Relation Age of Onset  . Cancer Mother   . Diabetes Mother   . Heart disease Mother   . Heart attack Father   . Stroke Father   . Parkinson's disease Brother   . Cancer Sister     Medications: Allergies as of 02/23/2017      Reactions   Erythromycin Swelling   Erythromycin Base    Other reaction(s): GI Upset (intolerance)   Penicillins Other (See Comments)   Unknown allergic reaction.  She has taken keflex and Rocephin many times without problems   Codeine Rash, Palpitations   Other reaction(s): GI Upset (intolerance)   Penicillin G Rash      Medication List       Accurate as  of 02/23/17  3:38 PM. Always use your most recent med list.          acetylcysteine 20 % nebulizer solution Commonly known as:  MUCOMYST Take 4 mLs by nebulization 3 (three) times daily.   allopurinol 100 MG tablet Commonly known as:  ZYLOPRIM Take 100 mg by mouth daily.   amiodarone 200 MG tablet Commonly known as:  PACERONE Take 200 mg by mouth daily.   aspirin 325 MG EC tablet Take 1 tablet (325 mg total) by mouth daily.   cetirizine 10 MG tablet Commonly known as:  ZYRTEC Take 5 mg by mouth at bedtime.   diazepam 5 MG tablet Commonly known as:  VALIUM Take one tablet by mouth every night at bedtime for rest   dicyclomine 20 MG tablet Commonly known as:  BENTYL Take 1 tablet (20 mg total) by mouth 3 (three) times daily as needed for spasms.   dorzolamide-timolol 22.3-6.8 MG/ML ophthalmic solution Commonly known as:  COSOPT Place 1 drop into the left eye 2 (two) times daily. Wait 2-5 minutes when giving two eye meds     FLONASE ALLERGY RELIEF 50 MCG/ACT nasal spray Generic drug:  fluticasone Place 2 sprays into both nostrils daily.   folic acid 1 MG tablet Commonly known as:  FOLVITE Take 1 mg by mouth daily.   guaiFENesin 600 MG 12 hr tablet Commonly known as:  MUCINEX Take 1 tablet (600 mg total) by mouth 2 (two) times daily.   meclizine 12.5 MG tablet Commonly known as:  ANTIVERT Take 12.5 mg by mouth 2 (two) times daily as needed for dizziness.   Melatonin 5 MG Tabs Take 5 mg by mouth at bedtime.   metoprolol tartrate 25 MG tablet Commonly known as:  LOPRESSOR Take 0.5 tablets (12.5 mg total) by mouth 2 (two) times daily. Hold if sbp less than 100 or heart rate less than 50, hold on dialysis day.   midodrine 10 MG tablet Commonly known as:  PROAMATINE Take 10 mg by mouth every Monday, Wednesday, and Friday. On dialysis days   NEPHRO-VITE PO Take 1 tablet by mouth at bedtime.   NUTRITIONAL SUPPLEMENT Liqd Take 120 mLs by mouth 3 (three) times daily.   OXYGEN Inhale 2 L/min into the lungs as needed.   promethazine 25 MG tablet Commonly known as:  PHENERGAN Take 25 mg by mouth every 8 (eight) hours as needed for nausea or vomiting.   saccharomyces boulardii 250 MG capsule Commonly known as:  FLORASTOR Take 250 mg by mouth 2 (two) times daily.   UNABLE TO FIND Med Name: Preparation H CRM Aloe.  Apply to external hemorrhoids BID PRN.   Vancomycin 750-5 MG/150ML-% Soln Commonly known as:  VANCOCIN Inject 750 mg into the vein. Every Monday, Wednesday and Friday       Immunizations: Immunization History  Administered Date(s) Administered  . Influenza-Unspecified 09/19/2014  . PPD Test 12/06/2014     Physical Exam: Vitals:   02/23/17 1531  BP: 131/75  Pulse: 67  Resp: 16  Temp: (!) 96.6 F (35.9 C)  TempSrc: Oral  SpO2: 96%  Weight: 163 lb 9.6 oz (74.2 kg)  Height: _0  (1.6 m)   Body mass index is 28.98 kg/m.  General- elderly female, well built,  chronically ill appearing, in no acute distress Head- normocephalic, atraumatic Nose- no nasal discharge Throat- moist mucus membrane, has dentures Eyes- PERRLA, EOMI, no pallor, no icterus, no discharge, normal conjunctiva, normal sclera, left eye stye Neck- no cervical  lymphadenopathy Cardiovascular- normal s1,s2, no murmur Respiratory- bilateral clear to auscultation, no wheeze, no rhonchi, no crackles, no use of accessory muscles Abdomen- bowel sounds present, soft, non tender, no guarding or rigidity Musculoskeletal- able to move all 4 extremities, generalized weakness more prominent to lower extremities, trace leg edema Neurological- alert and oriented to person, place and time Skin- warm and dry, left arm AV fistula with good thrill Psychiatry- normal mood and affect    Labs reviewed: Basic Metabolic Panel:  Recent Labs  01/26/17 0915  01/28/17 0900 01/29/17 0357 02/02/17 1455  NA 134*  < > 133* 134* 134*  K 3.8  < > 3.5 3.8 3.7  CL 99*  < > 97* 96* 99*  CO2 27  < > _0 GLUCOSE 118*  < > 122* 84 144*  BUN 28*  < > 11 6 26*  CREATININE 3.42*  < > 1.99* 1.45* 3.39*  CALCIUM 7.6*  < > 7.2* 7.3* 7.6*  PHOS 1.7*  --  1.3*  --  3.5  < > = values in this interval not displayed. Liver Function Tests:  Recent Labs  01/25/17 0446  01/28/17 0331 01/28/17 0900 01/29/17 0357 02/02/17 1455  AST 139*  --  142*  --  145*  --   ALT 98*  --  108*  --  113*  --   ALKPHOS 90  --  102  --  105  --   BILITOT 0.8  --  1.0  --  0.8  --   PROT 5.1*  --  5.1*  --  5.2*  --   ALBUMIN 1.6*  < > 1.5* 1.5* 1.5* 1.5*  < > = values in this interval not displayed. No results for input(s): LIPASE, AMYLASE in the last 8760 hours. No results for input(s): AMMONIA in the last 8760 hours. CBC:  Recent Labs  09/04/16 01/06/17 01/19/17 1725  01/28/17 0900 01/29/17 0357 02/02/17 1456  WBC 8.6 8.8 12.5*  < > 7.2 8.2 8.6  NEUTROABS 4 6,274 8.1*  --   --   --   --   HGB 12.9 11.5*  11.2*  < > 7.6* 8.0* 7.9*  HCT 43 36 36.5  < > 23.8* 25.2* 25.1*  MCV  --   --  99.7  < > 96.7 97.7 97.7  PLT 179 170 166  < > 167 182 262  < > = values in this interval not displayed. Cardiac Enzymes:  Recent Labs  01/21/17 0436 01/29/17 0357  CKTOTAL 982* 996*   BNP: Invalid input(s): POCBNP CBG:  Recent Labs  02/03/17 0805 02/03/17 1245 02/03/17 1624  GLUCAP 112* 141* 174*    Radiological Exams: Ct Chest Wo Contrast  Result Date: 01/28/2017 CLINICAL DATA:  Pneumonia, CHF, nonsmoker EXAM: CT CHEST WITHOUT CONTRAST TECHNIQUE: Multidetector CT imaging of the chest was performed following the standard protocol without IV contrast. COMPARISON:  CXR from 01/25/2017 FINDINGS: Cardiovascular: Normal sized cardiac chambers. Aortic atherosclerosis without aneurysm. There is coronary arteriosclerosis. Trace anterior pericardial effusion or thickening. Normal branch pattern of the great vessels. Mediastinum/Nodes: No pathologically enlarged lymph nodes. Trachea is unremarkable. Thyroid gland demonstrates coarse calcification on the right and 9 mm hypodense nodule on the left. Lungs/Pleura: There are soft tissue densities in the distal left mainstem bronchus causing postobstructive atelectasis pulmonary consolidation of the left lung. Slight mediastinal shift to the left due to volume loss. Findings may be secondary to mucous impaction. Small left pleural effusion. 5 - 6 mm  pulmonary nodular densities in the right middle lobe, series 201, image 55, 57, and 58. Patchy airspace disease in the right lower lobe consistent with pneumonia. Upper Abdomen: Bilateral breast implants with possible old intracapsular rupture on the left. Cholecystectomy. Cyst off the upper pole the right kidney measuring 1.9 cm. No adrenal mass. Musculoskeletal: Degenerative changes along the dorsal spine. No acute or suspicious osseous abnormalities. IMPRESSION: 1. Soft tissue densities are noted in the left mainstem bronchus  causing multilobar collapse of the left lung. This may be from mucous impaction. Endobronchial lesions are not entirely excluded but given that this is a new recent finding, mucous plugging is believed more likely. Moderate left pleural effusion. 2. Right lower lobe pneumonia with atelectasis. 3. 5 - 6 mm nodular densities in the right middle lobe. Non-contrast chest CT at 3-6 months is recommended. If the nodules are stable at time of repeat CT, then future CT at 18-24 months (from today's scan) is considered optional for low-risk patients, but is recommended for high-risk patients. This recommendation follows the consensus statement: Guidelines for Management of Incidental Pulmonary Nodules Detected on CT Images: From the Fleischner Society 2017; Radiology 2017; 284:228-243. 4. Intracapsular rupture of the left breast implant. 5. Right upper pole renal cyst measuring 1.9 cm. Electronically Signed   By: Ashley Royalty M.D.   On: 01/28/2017 22:06   Dg Chest Port 1 View  Result Date: 01/31/2017 CLINICAL DATA:  Pneumonia EXAM: PORTABLE CHEST 1 VIEW COMPARISON:  01/30/2017 FINDINGS: Left mid and lower lung airspace opacities noted, compatible with pneumonia. Aeration in the left lung as improved since prior study. Probable small to moderate left effusion. No confluent opacity on the right. IMPRESSION: Improving aeration on the left. Continued small to moderate left pleural effusion with left mid and lower lung consolidation. Electronically Signed   By: Rolm Baptise M.D.   On: 01/31/2017 08:49   Dg Chest Port 1 View  Result Date: 01/30/2017 CLINICAL DATA:  Pneumonia EXAM: PORTABLE CHEST 1 VIEW COMPARISON:  Chest radiograph January 25, 2017 and chest CT January 28, 2017 FINDINGS: There is consolidation with effusion throughout the left lung with essentially complete opacification of the left hemithorax. There is shift of heart and mediastinum toward the left. The right lung is clear. Heart size appears grossly stable  with opacification along the left heart border due to the consolidation and effusion. The pulmonary vascularity on the right appears normal. Pulmonary vascularity on the left is obscured. No adenopathy seen on the right. There is aortic atherosclerosis. No evident bone lesions. IMPRESSION: Opacification of the left hemithorax due to consolidation and pleural effusion, similar to recent CT appearance. Right lung clear. Shift of heart and mediastinum toward the left as noted on CT examination 2 days prior. Aortic atherosclerosis. Electronically Signed   By: Lowella Grip III M.D.   On: 01/30/2017 08:40   Dg Chest Port 1 View  Result Date: 01/25/2017 CLINICAL DATA:  Respiratory distress. Rapid response called. Hx diabetes, HTN, HLD, CHF, A. Fib, never smoker. EXAM: PORTABLE CHEST 1 VIEW COMPARISON:  Chest x-rays dated 01/19/2017 and 12/18/2014. FINDINGS: Heart size is grossly stable, partially obscured by the opacity at the left lung base. Atherosclerotic changes noted at the aortic arch. Central pulmonary vascular congestion and mild bilateral interstitial edema persists. Interval enlargement of the left lower lobe airspace opacity, likely a combination of atelectasis and layering pleural effusion. Pleural effusion is likely moderate in size. Suspect additional small right pleural effusion. IMPRESSION: 1. Central pulmonary vascular  congestion and mild bilateral interstitial edema indicating some degree of CHF/volume overload. 2. Increased opacity at the left lung base, likely a combination of atelectasis and layering pleural effusion. Pleural effusion is likely moderate in size. 3. Probable small right pleural effusion. 4. Aortic atherosclerosis. Electronically Signed   By: Franki Cabot M.D.   On: 01/25/2017 17:24    Assessment/Plan   Generalized weakness Will have her work with physical therapy and occupational therapy team to help with gait training and muscle strengthening exercises.fall precautions.  Skin care. Encourage to be out of bed.   Influenza pneumonia Completed course of antiviral, monitor clinically, encourage to use incentive spirometer to prevent atelectasis  MRSA pneumonia Has completed course of vancomycin, monitor clinically  Hyponatremia Monitor bmp  Hypertension Monitor BP, continue metoprolol tartrate 12.5 mg bid  Loose stool Currently on probiotic. Monitor clinically and if no improvement, will need to send stool for infectious workup  ESRD On hD 3 days a week. Continue midodrine on dialysis days to prevent hypotension and meclizine on need basis for dizziness  Anemia of chronic disease With ckd, monitor cbc periodically  afib Controlled HR, continue metoprolol and amiodarone with aspirin 325 mg daily    Goals of care: long term care   Labs/tests ordered: none  Family/ staff Communication: reviewed care plan with patient and nursing supervisor    [name], MD Internal Medicine [facility] [place], [place] [address] Cell Phone (Monday-Friday 8 am - 5 pm): [phone] On Call: [phone] and follow prompts after 5 pm and on weekends Office Phone: [phone] Office Fax: [phone]

## 2017-02-25 DIAGNOSIS — E1122 Type 2 diabetes mellitus with diabetic chronic kidney disease: Secondary | ICD-10-CM | POA: Diagnosis not present

## 2017-02-25 DIAGNOSIS — N186 End stage renal disease: Secondary | ICD-10-CM | POA: Diagnosis not present

## 2017-02-25 DIAGNOSIS — N2581 Secondary hyperparathyroidism of renal origin: Secondary | ICD-10-CM | POA: Diagnosis not present

## 2017-02-27 DIAGNOSIS — N2581 Secondary hyperparathyroidism of renal origin: Secondary | ICD-10-CM | POA: Diagnosis not present

## 2017-02-27 DIAGNOSIS — N186 End stage renal disease: Secondary | ICD-10-CM | POA: Diagnosis not present

## 2017-02-27 DIAGNOSIS — E1122 Type 2 diabetes mellitus with diabetic chronic kidney disease: Secondary | ICD-10-CM | POA: Diagnosis not present

## 2017-03-02 DIAGNOSIS — N186 End stage renal disease: Secondary | ICD-10-CM | POA: Diagnosis not present

## 2017-03-02 DIAGNOSIS — E1122 Type 2 diabetes mellitus with diabetic chronic kidney disease: Secondary | ICD-10-CM | POA: Diagnosis not present

## 2017-03-02 DIAGNOSIS — N2581 Secondary hyperparathyroidism of renal origin: Secondary | ICD-10-CM | POA: Diagnosis not present

## 2017-03-03 DIAGNOSIS — N390 Urinary tract infection, site not specified: Secondary | ICD-10-CM | POA: Diagnosis not present

## 2017-03-04 DIAGNOSIS — N2581 Secondary hyperparathyroidism of renal origin: Secondary | ICD-10-CM | POA: Diagnosis not present

## 2017-03-04 DIAGNOSIS — N186 End stage renal disease: Secondary | ICD-10-CM | POA: Diagnosis not present

## 2017-03-04 DIAGNOSIS — E1122 Type 2 diabetes mellitus with diabetic chronic kidney disease: Secondary | ICD-10-CM | POA: Diagnosis not present

## 2017-03-06 DIAGNOSIS — N2581 Secondary hyperparathyroidism of renal origin: Secondary | ICD-10-CM | POA: Diagnosis not present

## 2017-03-06 DIAGNOSIS — E1122 Type 2 diabetes mellitus with diabetic chronic kidney disease: Secondary | ICD-10-CM | POA: Diagnosis not present

## 2017-03-06 DIAGNOSIS — N186 End stage renal disease: Secondary | ICD-10-CM | POA: Diagnosis not present

## 2017-03-09 DIAGNOSIS — N186 End stage renal disease: Secondary | ICD-10-CM | POA: Diagnosis not present

## 2017-03-09 DIAGNOSIS — N2581 Secondary hyperparathyroidism of renal origin: Secondary | ICD-10-CM | POA: Diagnosis not present

## 2017-03-09 DIAGNOSIS — E1122 Type 2 diabetes mellitus with diabetic chronic kidney disease: Secondary | ICD-10-CM | POA: Diagnosis not present

## 2017-03-11 DIAGNOSIS — N186 End stage renal disease: Secondary | ICD-10-CM | POA: Diagnosis not present

## 2017-03-11 DIAGNOSIS — E1122 Type 2 diabetes mellitus with diabetic chronic kidney disease: Secondary | ICD-10-CM | POA: Diagnosis not present

## 2017-03-11 DIAGNOSIS — N2581 Secondary hyperparathyroidism of renal origin: Secondary | ICD-10-CM | POA: Diagnosis not present

## 2017-03-13 DIAGNOSIS — N2581 Secondary hyperparathyroidism of renal origin: Secondary | ICD-10-CM | POA: Diagnosis not present

## 2017-03-13 DIAGNOSIS — N186 End stage renal disease: Secondary | ICD-10-CM | POA: Diagnosis not present

## 2017-03-13 DIAGNOSIS — E1122 Type 2 diabetes mellitus with diabetic chronic kidney disease: Secondary | ICD-10-CM | POA: Diagnosis not present

## 2017-03-16 DIAGNOSIS — N2581 Secondary hyperparathyroidism of renal origin: Secondary | ICD-10-CM | POA: Diagnosis not present

## 2017-03-16 DIAGNOSIS — E1122 Type 2 diabetes mellitus with diabetic chronic kidney disease: Secondary | ICD-10-CM | POA: Diagnosis not present

## 2017-03-16 DIAGNOSIS — N186 End stage renal disease: Secondary | ICD-10-CM | POA: Diagnosis not present

## 2017-03-17 ENCOUNTER — Ambulatory Visit: Payer: Medicare Other | Admitting: Cardiology

## 2017-03-18 DIAGNOSIS — N2581 Secondary hyperparathyroidism of renal origin: Secondary | ICD-10-CM | POA: Diagnosis not present

## 2017-03-18 DIAGNOSIS — N186 End stage renal disease: Secondary | ICD-10-CM | POA: Diagnosis not present

## 2017-03-18 DIAGNOSIS — E1122 Type 2 diabetes mellitus with diabetic chronic kidney disease: Secondary | ICD-10-CM | POA: Diagnosis not present

## 2017-03-20 DIAGNOSIS — N186 End stage renal disease: Secondary | ICD-10-CM | POA: Diagnosis not present

## 2017-03-20 DIAGNOSIS — E1122 Type 2 diabetes mellitus with diabetic chronic kidney disease: Secondary | ICD-10-CM | POA: Diagnosis not present

## 2017-03-20 DIAGNOSIS — N2581 Secondary hyperparathyroidism of renal origin: Secondary | ICD-10-CM | POA: Diagnosis not present

## 2017-03-21 DIAGNOSIS — Z992 Dependence on renal dialysis: Secondary | ICD-10-CM | POA: Diagnosis not present

## 2017-03-21 DIAGNOSIS — N186 End stage renal disease: Secondary | ICD-10-CM | POA: Diagnosis not present

## 2017-03-21 DIAGNOSIS — E1122 Type 2 diabetes mellitus with diabetic chronic kidney disease: Secondary | ICD-10-CM | POA: Diagnosis not present

## 2017-03-23 DIAGNOSIS — N186 End stage renal disease: Secondary | ICD-10-CM | POA: Diagnosis not present

## 2017-03-23 DIAGNOSIS — E1122 Type 2 diabetes mellitus with diabetic chronic kidney disease: Secondary | ICD-10-CM | POA: Diagnosis not present

## 2017-03-23 DIAGNOSIS — N2581 Secondary hyperparathyroidism of renal origin: Secondary | ICD-10-CM | POA: Diagnosis not present

## 2017-03-25 ENCOUNTER — Encounter: Payer: Self-pay | Admitting: Adult Health

## 2017-03-25 DIAGNOSIS — E1122 Type 2 diabetes mellitus with diabetic chronic kidney disease: Secondary | ICD-10-CM | POA: Diagnosis not present

## 2017-03-25 DIAGNOSIS — N186 End stage renal disease: Secondary | ICD-10-CM | POA: Diagnosis not present

## 2017-03-25 DIAGNOSIS — N2581 Secondary hyperparathyroidism of renal origin: Secondary | ICD-10-CM | POA: Diagnosis not present

## 2017-03-25 NOTE — Progress Notes (Signed)
DATE:  03/25/2017 MRN:  [id number]  BIRTHDAY: 22-Nov-1936  Facility:  Nursing Home Location:  [facility] Room Number: 205-A  LEVEL OF CARE:  SNF (31)  Contact Information    Name Relation Home Work Dunn Daughter [phone]  [phone]   Mamie Laurel   [phone]   Renea, [place] [phone]         Code Status History    Date Active Date Inactive Code Status Order ID Comments User Context   01/20/2017  1:36 AM 02/03/2017  8:38 PM Full Code [id number]  Edwin Dada, MD ED   12/21/2014  1:14 PM 12/22/2014  4:36 PM DNR [id number]  Sol Blazing, MD Inpatient   12/19/2014  7:00 AM 12/21/2014  1:14 PM Full Code [id number]  Ivor Costa, MD ED   11/12/2014 12:06 PM 12/06/2014  8:35 PM Full Code [id number]  Azzie Roup, MD Inpatient   11/02/2014  4:18 PM 11/12/2014 12:06 PM Full Code [id number]  Ermalinda Memos Inpatient   10/17/2014  5:52 PM 11/02/2014  4:18 PM Full Code [id number]  Jonetta Osgood, MD Inpatient       Chief Complaint  Patient presents with  . Medical Management of Chronic Issues    HISTORY OF PRESENT ILLNESS:  This is a 24-YO female seen for a routine visit.  She is a long-term care resident of [facility].    PAST MEDICAL HISTORY:  Past Medical History:  Diagnosis Date  . Anemia, chronic disease   . Anxiety   . Arthritis    knees  . Atrial fibrillation ([place])   . Barrett esophagus   . Benign paroxysmal positional vertigo   . Breast cancer ([place]) 1980s   bil mastectomies, no radiation or chemo  . Cataract   . Chest pain, atypical 12/05/2008   R/Lmv- normal perfusion all regions, noe ECG changes   . CHF (congestive heart failure) ([place]) 05/13/2011   echo - ST>41%; stage 1 diastolic dysfunction; elevated LV filling pressure, MAC  . CKD (chronic kidney disease), stage III   . Claudication ([place]) 10/30/2005   doppler - normal evaluation, no evidence of  aneurysm, diameter reduction, dissection, compression or vascular abnormality)  . Complication of anesthesia   . Dyslipidemia   . Dysrhythmia    Atrial Fibrillation  . Edema   . ESRD (end stage renal disease) ([place])   . Family history of adverse reaction to anesthesia    son also has nausea   . Fever blister 12/2016  . Gastritis and gastroduodenitis   . GERD (gastroesophageal reflux disease)   . Gout   . Hemodialysis-associated hypotension   . HLD (hyperlipidemia)   . Hypertension   . IBS (irritable bowel syndrome)   . Insomnia   . Morbid obesity ([place])   . PAF (paroxysmal atrial fibrillation) ([place])   . Peripheral neuropathy ([place])   . PONV (postoperative nausea and vomiting)   . Renal cyst   . Renal insufficiency 10/30/2005   doppler - abn resistance consistent w/ parenchymal disease  . Renovascular hypertension   . Type 2 diabetes mellitus with diabetic neuropathy, with long-term current use of insulin ([place])   . Vertigo      CURRENT MEDICATIONS: Reviewed  Patient's Medications  New Prescriptions   No medications on file  Previous Medications   ACETYLCYSTEINE (MUCOMYST) 20 % NEBULIZER SOLUTION    Take 4 mLs by nebulization 3 (three) times daily.   ALLOPURINOL (ZYLOPRIM)  100 MG TABLET    Take 100 mg by mouth daily.    AMIODARONE (PACERONE) 200 MG TABLET    Take 200 mg by mouth daily.    ASPIRIN EC 325 MG EC TABLET    Take 1 tablet (325 mg total) by mouth daily.   B COMPLEX-C-FOLIC ACID (NEPHRO-VITE PO)    Take 1 tablet by mouth at bedtime.    CETIRIZINE (ZYRTEC) 10 MG TABLET    Take 5 mg by mouth at bedtime.    DIAZEPAM (VALIUM) 5 MG TABLET    Take one tablet by mouth every night at bedtime for rest   DICYCLOMINE (BENTYL) 20 MG TABLET    Take 1 tablet (20 mg total) by mouth 3 (three) times daily as needed for spasms.   DORZOLAMIDE-TIMOLOL (COSOPT) 22.3-6.8 MG/ML OPHTHALMIC SOLUTION    Place 1 drop into the left eye 2 (two) times daily. Wait 2-5 minutes when giving two eye meds    FLUTICASONE (FLONASE ALLERGY RELIEF) 50 MCG/ACT NASAL SPRAY    Place 2 sprays into both nostrils daily.   FOLIC ACID (FOLVITE) 1 MG TABLET    Take 1 mg by mouth daily.   GUAIFENESIN (MUCINEX) 600 MG 12 HR TABLET    Take 1 tablet (600 mg total) by mouth 2 (two) times daily.   LANTHANUM (FOSRENOL) 1000 MG CHEWABLE TABLET    Chew 1,000 mg by mouth 3 (three) times daily with meals.   MECLIZINE (ANTIVERT) 12.5 MG TABLET    Take 12.5 mg by mouth 2 (two) times daily as needed for dizziness.    MELATONIN 5 MG TABS    Take 5 mg by mouth at bedtime.    METOPROLOL TARTRATE (LOPRESSOR) 25 MG TABLET    Take 0.5 tablets (12.5 mg total) by mouth 2 (two) times daily. Hold if sbp less than 100 or heart rate less than 50, hold on dialysis day.   MIDODRINE (PROAMATINE) 10 MG TABLET    Take 10 mg by mouth every Monday, Wednesday, and Friday. On dialysis days   NUTRITIONAL SUPPLEMENT LIQD    Take 120 mLs by mouth 3 (three) times daily. MedPass   OXYGEN    Inhale 2 L/min into the lungs as needed.    PROMETHAZINE (PHENERGAN) 25 MG TABLET    Take 25 mg by mouth every 8 (eight) hours as needed for nausea or vomiting.    SACCHAROMYCES BOULARDII (FLORASTOR) 250 MG CAPSULE    Take 250 mg by mouth 2 (two) times daily.   UNABLE TO FIND    Med Name: Preparation H CRM Aloe.  Apply to external hemorrhoids BID PRN.   VANCOMYCIN (VANCOCIN) 750-5 MG/150ML-% SOLN    Inject 750 mg into the vein. Every Monday, Wednesday and Friday  Modified Medications   No medications on file  Discontinued Medications   No medications on file     Allergies  Allergen Reactions  . Erythromycin Swelling  . Erythromycin Base     Other reaction(s): GI Upset (intolerance)  . Penicillins Other (See Comments)    Unknown allergic reaction.  She has taken keflex and Rocephin many times without problems  . Codeine Rash and Palpitations    Other reaction(s): GI Upset (intolerance)  . Penicillin G Rash     REVIEW OF SYSTEMS:  GENERAL: no change  in appetite, no fatigue, no weight changes, no fever, chills or weakness SKIN: Denies rash, itching, wounds, ulcer sores, or nail abnormality EYES: Denies change in vision, dry eyes, eye pain, itching  or discharge EARS: Denies change in hearing, ringing in ears, or earache NOSE: Denies nasal congestion or epistaxis MOUTH and THROAT: Denies oral discomfort, gingival pain or bleeding, pain from teeth or hoarseness   RESPIRATORY: no cough, SOB, DOE, wheezing, hemoptysis CARDIAC: no chest pain, edema or palpitations GI: no abdominal pain, diarrhea, constipation, heart burn, nausea or vomiting GU: Denies dysuria, frequency, hematuria, incontinence, or discharge MUSCULOSKELETAL: Denies joit pain, muscle pain, back pain, restricted movement, or unusual weakness CIRCULATION: Denies claudication, edema of legs, varicosities, or cold extremities NEUROLOGICAL: Denies dizziness, syncope, numbness, or headache PSYCHIATRIC: Denies feeling of depression or anxiety. No report of hallucinations, insomnia, paranoia, or agitation ENDOCRINE: Denies polyphagia, polyuria, polydipsia, heat or cold intolerance HEME/LYMPH: Denies excessive bruising, petechia, enlarged lymph nodes, or bleeding problems IMMUNOLOGIC: Denies history of frequent infections, AIDS, or use of immunosuppressive agents    PHYSICAL EXAMINATION  GENERAL APPEARANCE: Well nourished. In no acute distress. Normal body habitus SKIN:  Skin is warm and dry. There are no suspicious lesions or rash HEAD: Normal in size and contour. No evidence of trauma EYES: Lids open and close normally. No blepharitis, entropion or ectropion. PERRL. Conjunctivae are clear and sclerae are white. Lenses are without opacity EARS: Pinnae are normal. Patient hears normal voice tunes of the examiner MOUTH and THROAT: Lips are without lesions. Oral mucosa is moist and without lesions. Tongue is normal in shape, size, and color and without lesions NECK: supple, trachea  midline, no neck masses, no thyroid tenderness, no thyromegaly LYMPHATICS: no LAN in the neck, no supraclavicular LAN RESPIRATORY: breathing is even & unlabored, BS CTAB CARDIAC: RRR, no murmur,no extra heart sounds, no edema GI: abdomen soft, normal BS, no masses, no tenderness, no hepatomegaly, no splenomegaly MUSCULOSKELETAL: No deformities. Movement at each extremity is full and painless. Strength is 5/5 at each extremity. Back is without kyphosis or scoliosis CIRCULATION: pedal pulses are 2+. There is no edema of the legs, ankles and feet NEUROLOGICAL: There is no tremor. Speech is clear PSYCHIATRIC: Alert and oriented X 3. Affect and behavior are appropriate  LABS/RADIOLOGY: Labs reviewed: Basic Metabolic Panel:  Recent Labs  01/26/17 0915  01/28/17 0900 01/29/17 0357 02/02/17 1455  NA 134*  < > 133* 134* 134*  K 3.8  < > 3.5 3.8 3.7  CL 99*  < > 97* 96* 99*  CO2 27  < > _0 GLUCOSE 118*  < > 122* 84 144*  BUN 28*  < > 11 6 26*  CREATININE 3.42*  < > 1.99* 1.45* 3.39*  CALCIUM 7.6*  < > 7.2* 7.3* 7.6*  PHOS 1.7*  --  1.3*  --  3.5  < > = values in this interval not displayed. Liver Function Tests:  Recent Labs  01/25/17 0446  01/28/17 0331 01/28/17 0900 01/29/17 0357 02/02/17 1455  AST 139*  --  142*  --  145*  --   ALT 98*  --  108*  --  113*  --   ALKPHOS 90  --  102  --  105  --   BILITOT 0.8  --  1.0  --  0.8  --   PROT 5.1*  --  5.1*  --  5.2*  --   ALBUMIN 1.6*  < > 1.5* 1.5* 1.5* 1.5*  < > = values in this interval not displayed. CBC:  Recent Labs  09/04/16 01/06/17 01/19/17 1725  01/28/17 0900 01/29/17 0357 02/02/17 1456  WBC 8.6 8.8 12.5*  < >  7.2 8.2 8.6  NEUTROABS 4 6,274 8.1*  --   --   --   --   HGB 12.9 11.5* 11.2*  < > 7.6* 8.0* 7.9*  HCT 43 36 36.5  < > 23.8* 25.2* 25.1*  MCV  --   --  99.7  < > 96.7 97.7 97.7  PLT 179 170 166  < > 167 182 262  < > = values in this interval not displayed. Lipid Panel:  Recent Labs  05/22/16  10/22/16  HDL 35 29*   Cardiac Enzymes:  Recent Labs  01/21/17 0436 01/29/17 0357  CKTOTAL 982* 996*   CBG:  Recent Labs  02/03/17 0805 02/03/17 1245 02/03/17 1624  GLUCAP 112* 141* 174*       ASSESSMENT/PLAN:        Monina C. Medina-Vargas - NP    [facility] [phone]   This encounter was created in error - please disregard.

## 2017-03-26 ENCOUNTER — Encounter: Payer: Self-pay | Admitting: Adult Health

## 2017-03-26 ENCOUNTER — Non-Acute Institutional Stay (SKILLED_NURSING_FACILITY): Payer: Medicare Other | Admitting: Adult Health

## 2017-03-26 DIAGNOSIS — N186 End stage renal disease: Secondary | ICD-10-CM

## 2017-03-26 DIAGNOSIS — R42 Dizziness and giddiness: Secondary | ICD-10-CM

## 2017-03-26 DIAGNOSIS — I953 Hypotension of hemodialysis: Secondary | ICD-10-CM | POA: Diagnosis not present

## 2017-03-26 DIAGNOSIS — J9611 Chronic respiratory failure with hypoxia: Secondary | ICD-10-CM

## 2017-03-26 DIAGNOSIS — I5042 Chronic combined systolic (congestive) and diastolic (congestive) heart failure: Secondary | ICD-10-CM

## 2017-03-26 DIAGNOSIS — Z09 Encounter for follow-up examination after completed treatment for conditions other than malignant neoplasm: Secondary | ICD-10-CM | POA: Diagnosis not present

## 2017-03-26 DIAGNOSIS — I480 Paroxysmal atrial fibrillation: Secondary | ICD-10-CM

## 2017-03-26 DIAGNOSIS — J189 Pneumonia, unspecified organism: Secondary | ICD-10-CM | POA: Diagnosis not present

## 2017-03-26 DIAGNOSIS — J309 Allergic rhinitis, unspecified: Secondary | ICD-10-CM | POA: Diagnosis not present

## 2017-03-26 DIAGNOSIS — K589 Irritable bowel syndrome without diarrhea: Secondary | ICD-10-CM | POA: Diagnosis not present

## 2017-03-26 DIAGNOSIS — G4700 Insomnia, unspecified: Secondary | ICD-10-CM | POA: Diagnosis not present

## 2017-03-26 DIAGNOSIS — Z792 Long term (current) use of antibiotics: Secondary | ICD-10-CM | POA: Diagnosis not present

## 2017-03-26 DIAGNOSIS — F419 Anxiety disorder, unspecified: Secondary | ICD-10-CM

## 2017-03-26 DIAGNOSIS — R05 Cough: Secondary | ICD-10-CM | POA: Diagnosis not present

## 2017-03-26 DIAGNOSIS — R0989 Other specified symptoms and signs involving the circulatory and respiratory systems: Secondary | ICD-10-CM | POA: Diagnosis not present

## 2017-03-26 NOTE — Progress Notes (Signed)
DATE:  03/26/2017   MRN:  [id number]  BIRTHDAY: [birth year]  Facility:  Nursing Home Location:  [facility] Room Number: 205-A  LEVEL OF CARE:  SNF (31)  Contact Information    Name Relation Home Work [place] Daughter [phone]  [phone]   Mamie Laurel   [phone]   Alajah, Witman [phone]         Code Status History    Date Active Date Inactive Code Status Order ID Comments User Context   01/20/2017  1:36 AM 02/03/2017  8:38 PM Full Code [id number]  Edwin Dada, MD ED   12/21/2014  1:14 PM 12/22/2014  4:36 PM DNR [id number]  Sol Blazing, MD Inpatient   12/19/2014  7:00 AM 12/21/2014  1:14 PM Full Code [id number]  Ivor Costa, MD ED   11/12/2014 12:06 PM 12/06/2014  8:35 PM Full Code [id number]  Azzie Roup, MD Inpatient   11/02/2014  4:18 PM 11/12/2014 12:06 PM Full Code [id number]  Ermalinda Memos Inpatient   10/17/2014  5:52 PM 11/02/2014  4:18 PM Full Code [id number]  Jonetta Osgood, MD Inpatient       Chief Complaint  Patient presents with  . Medical Management of Chronic Issues    HISTORY OF PRESENT ILLNESS:  This is a 66-YO female seen for a routine visit.  She is a long-term care resident of [facility]. She wants to know whether her PNA has resolved.She was not noted to have SOB but with O2 @ 2L/min via [place] continuously.  She was recently treated for UTI with Cipro X 7 days and Vaginal yeast with one time dose of Diflucan.   CXR was done today and result showed findings may represent left lower lobe atelectasis or pneumonia. Patient has no reported fever nor shortness of breath.  PAST MEDICAL HISTORY:  Past Medical History:  Diagnosis Date  . Anemia, chronic disease   . Anxiety   . Arthritis    knees  . Atrial fibrillation ([place])   . Barrett esophagus   . Benign paroxysmal positional vertigo   . Breast cancer ([place]) 1980s   bil mastectomies, no  radiation or chemo  . Cataract   . Chest pain, atypical 12/05/2008   R/Lmv- normal perfusion all regions, noe ECG changes   . CHF (congestive heart failure) ([place]) 05/13/2011   echo - HQ>75%; stage 1 diastolic dysfunction; elevated LV filling pressure, MAC  . CKD (chronic kidney disease), stage III   . Claudication ([place]) 10/30/2005   doppler - normal evaluation, no evidence of aneurysm, diameter reduction, dissection, compression or vascular abnormality)  . Complication of anesthesia   . Dyslipidemia   . Dysrhythmia    Atrial Fibrillation  . Edema   . ESRD (end stage renal disease) ([place])   . Family history of adverse reaction to anesthesia    son also has nausea   . Fever blister 12/2016  . Gastritis and gastroduodenitis   . GERD (gastroesophageal reflux disease)   . Gout   . Hemodialysis-associated hypotension   . HLD (hyperlipidemia)   . Hypertension   . IBS (irritable bowel syndrome)   . Insomnia   . Morbid obesity ([place])   . PAF (paroxysmal atrial fibrillation) ([place])   . Peripheral neuropathy ([place])   . PONV (postoperative nausea and vomiting)   . Renal cyst   . Renal insufficiency 10/30/2005   doppler - abn resistance consistent w/ parenchymal disease  .  Renovascular hypertension   . Type 2 diabetes mellitus with diabetic neuropathy, with long-term current use of insulin ([place])   . Vertigo      CURRENT MEDICATIONS: Reviewed  Patient's Medications  New Prescriptions   No medications on file  Previous Medications   ACETYLCYSTEINE (MUCOMYST) 20 % NEBULIZER SOLUTION    Take 4 mLs by nebulization 3 (three) times daily.   ALLOPURINOL (ZYLOPRIM) 100 MG TABLET    Take 100 mg by mouth daily.    AMIODARONE (PACERONE) 200 MG TABLET    Take 200 mg by mouth daily.    ASPIRIN EC 325 MG EC TABLET    Take 1 tablet (325 mg total) by mouth daily.   B COMPLEX-C-FOLIC ACID (NEPHRO-VITE PO)    Take 1 tablet by mouth at bedtime.    CETIRIZINE (ZYRTEC) 10 MG TABLET    Take 5 mg by mouth at  bedtime.    DIAZEPAM (VALIUM) 5 MG TABLET    Take one tablet by mouth every night at bedtime for rest   DICYCLOMINE (BENTYL) 20 MG TABLET    Take 1 tablet (20 mg total) by mouth 3 (three) times daily as needed for spasms.   DORZOLAMIDE-TIMOLOL (COSOPT) 22.3-6.8 MG/ML OPHTHALMIC SOLUTION    Place 1 drop into the left eye 2 (two) times daily. Wait 2-5 minutes when giving two eye meds   FLUTICASONE (FLONASE ALLERGY RELIEF) 50 MCG/ACT NASAL SPRAY    Place 2 sprays into both nostrils daily.   FOLIC ACID (FOLVITE) 1 MG TABLET    Take 1 mg by mouth daily.   GUAIFENESIN (MUCINEX) 600 MG 12 HR TABLET    Take 1 tablet (600 mg total) by mouth 2 (two) times daily.   LANTHANUM (FOSRENOL) 1000 MG CHEWABLE TABLET    Chew 1,000 mg by mouth 3 (three) times daily with meals.   MECLIZINE (ANTIVERT) 12.5 MG TABLET    Take 12.5 mg by mouth 2 (two) times daily as needed for dizziness.    MELATONIN 5 MG TABS    Take 5 mg by mouth at bedtime.    METOPROLOL TARTRATE (LOPRESSOR) 25 MG TABLET    Take 0.5 tablets (12.5 mg total) by mouth 2 (two) times daily. Hold if sbp less than 100 or heart rate less than 50, hold on dialysis day.   MIDODRINE (PROAMATINE) 10 MG TABLET    Take 10 mg by mouth every Monday, Wednesday, and Friday. On dialysis days   NUTRITIONAL SUPPLEMENT LIQD    Take 120 mLs by mouth 3 (three) times daily. MedPass   OXYGEN    Inhale 2 L/min into the lungs as needed.    PROMETHAZINE (PHENERGAN) 25 MG TABLET    Take 25 mg by mouth every 8 (eight) hours as needed for nausea or vomiting.    SACCHAROMYCES BOULARDII (FLORASTOR) 250 MG CAPSULE    Take 250 mg by mouth 2 (two) times daily.   UNABLE TO FIND    Med Name: Preparation H CRM Aloe.  Apply to external hemorrhoids BID PRN.   VANCOMYCIN (VANCOCIN) 750-5 MG/150ML-% SOLN    Inject 750 mg into the vein. Every Monday, Wednesday and Friday  Modified Medications   No medications on file  Discontinued Medications   No medications on file     Allergies    Allergen Reactions  . Erythromycin Swelling  . Erythromycin Base     Other reaction(s): GI Upset (intolerance)  . Penicillins Other (See Comments)    Unknown allergic reaction.  She  has taken keflex and Rocephin many times without problems  . Codeine Rash and Palpitations    Other reaction(s): GI Upset (intolerance)  . Penicillin G Rash     REVIEW OF SYSTEMS:  GENERAL: no change in appetite, no fatigue, no weight changes, no fever, chills or weakness EYES: Denies change in vision, dry eyes, eye pain, itching or discharge EARS: Denies change in hearing, ringing in ears, or earache NOSE: Denies nasal congestion or epistaxis MOUTH and THROAT: Denies oral discomfort, gingival pain or bleeding, pain from teeth or hoarseness   RESPIRATORY: no cough, SOB, DOE, wheezing, hemoptysis CARDIAC: no chest pain, edema or palpitations GI: no abdominal pain, diarrhea, constipation, heart burn, nausea or vomiting GU: Denies dysuria, frequency, hematuria, incontinence, or discharge PSYCHIATRIC: Denies feeling of depression or anxiety. No report of hallucinations, insomnia, paranoia, or agitation    PHYSICAL EXAMINATION  GENERAL APPEARANCE: Well nourished. In no acute distress. Normal body habitus SKIN:  Skin is warm and dry.  HEAD: Normal in size and contour. No evidence of trauma EYES: Lids open and close normally. No blepharitis, entropion or ectropion. PERRL. Conjunctivae are clear and sclerae are white. Lenses are without opacity EARS: Pinnae are normal. Patient hears normal voice tunes of the examiner MOUTH and THROAT: Lips are without lesions. Oral mucosa is moist and without lesions. Tongue is normal in shape, size, and color and without lesions NECK: supple, trachea midline, no neck masses, no thyroid tenderness, no thyromegaly LYMPHATICS: no LAN in the neck, no supraclavicular LAN RESPIRATORY: breathing is even & unlabored, BS CTAB, has O2 @ 2L/min via [place] CARDIAC: RRR, no murmur,no  extra heart sounds, no edema GI: abdomen soft, normal BS, no masses, no tenderness, no hepatomegaly, no splenomegaly EXTREMITIES:  Able to move X 4 extremities, AV fistula on left upper arm + bruit/thrill PSYCHIATRIC: Alert and oriented X 3. Affect and behavior are appropriate  LABS/RADIOLOGY: Labs reviewed: Basic Metabolic Panel:  Recent Labs  01/26/17 0915  01/28/17 0900 01/29/17 0357 02/02/17 1455  NA 134*  < > 133* 134* 134*  K 3.8  < > 3.5 3.8 3.7  CL 99*  < > 97* 96* 99*  CO2 27  < > 28 30 25   GLUCOSE 118*  < > 122* 84 144*  BUN 28*  < > 11 6 26*  CREATININE 3.42*  < > 1.99* 1.45* 3.39*  CALCIUM 7.6*  < > 7.2* 7.3* 7.6*  PHOS 1.7*  --  1.3*  --  3.5  < > = values in this interval not displayed. Liver Function Tests:  Recent Labs  01/25/17 0446  01/28/17 0331 01/28/17 0900 01/29/17 0357 02/02/17 1455  AST 139*  --  142*  --  145*  --   ALT 98*  --  108*  --  113*  --   ALKPHOS 90  --  102  --  105  --   BILITOT 0.8  --  1.0  --  0.8  --   PROT 5.1*  --  5.1*  --  5.2*  --   ALBUMIN 1.6*  < > 1.5* 1.5* 1.5* 1.5*  < > = values in this interval not displayed. CBC:  Recent Labs  09/04/16 01/06/17 01/19/17 1725  01/28/17 0900 01/29/17 0357 02/02/17 1456  WBC 8.6 8.8 12.5*  < > 7.2 8.2 8.6  NEUTROABS 4 6,274 8.1*  --   --   --   --   HGB 12.9 11.5* 11.2*  < > 7.6* 8.0* 7.9*  HCT 43 36 36.5  < > 23.8* 25.2* 25.1*  MCV  --   --  99.7  < > 96.7 97.7 97.7  PLT 179 170 166  < > 167 182 262  < > = values in this interval not displayed. Lipid Panel:  Recent Labs  05/22/16 10/22/16  HDL 35 29*   Cardiac Enzymes:  Recent Labs  01/21/17 0436 01/29/17 0357  CKTOTAL 982* 996*   CBG:  Recent Labs  02/03/17 0805 02/03/17 1245 02/03/17 1624  GLUCAP 112* 141* 174*     ASSESSMENT/PLAN:  Chronic respiratory failure with hypoxia - continue O2 at 2 L/minute via [place] continuously  HCAP -  Start Doxycycline 100 mg PO BID X 10 days and currently on Florastor  250 mg 1 capsule PO BID and guaifenesin ER 600 mg 1 tab by mouth twice a day .; start Albuterol 2.5 mg/65m 1 neb Q 6AM and 8PM X 2 weeks; pulmonology consult;  continue acetylcysteine 20% 4 mL neb TID  Vertigo - continue meclizine 12.5 mg 1 tab by mouth twice a day when necessary  PAF - rate controlled; continue metoprolol tartrate 25 mg give 1/2 tab = 12.5 mg by mouth twice a day (being held on dialysis days due to hypotension) and amiodarone 200 mg 1 tab by mouth daily and aspirin 325 mg EC 1 tab PO daily  ESRD - on HD MWF, recently started on Fosrenol 1000 mg chewable tablet 1 tab by mouth with meals TID  Chronic combined systolic and diastolic CHF - stable  Allergic rhinitis - continue fluticasone 50 g 2 sprays into both nostrils daily and discontinue cetirizine  Hypotension - due to dialysis, continue midodrine 10 mg 1 tab by mouth daily on dialysis days MWF  Insomnia - continue melatonin 5 mg 1 tab by mouth daily at bedtime  Anxiety - mood this is stable; continue diazepam 5 mg 1 tab by mouth daily at bedtime  IBS - continue dicyclomine 20 mg 1 tab by mouth 3 times a day when necessary     Goals of care:  Long-term care    Larenz Frasier C. MGonzales- NP    [phone]

## 2017-03-27 DIAGNOSIS — E1122 Type 2 diabetes mellitus with diabetic chronic kidney disease: Secondary | ICD-10-CM | POA: Diagnosis not present

## 2017-03-27 DIAGNOSIS — N2581 Secondary hyperparathyroidism of renal origin: Secondary | ICD-10-CM | POA: Diagnosis not present

## 2017-03-27 DIAGNOSIS — N186 End stage renal disease: Secondary | ICD-10-CM | POA: Diagnosis not present

## 2017-03-27 DIAGNOSIS — D720 Genetic anomalies of leukocytes: Secondary | ICD-10-CM | POA: Diagnosis not present

## 2017-03-27 LAB — CBC AND DIFFERENTIAL
HEMATOCRIT: 36 % (ref 36–46)
HEMOGLOBIN: 11.3 g/dL — AB (ref 12.0–16.0)
PLATELETS: 148 10*3/uL — AB (ref 150–399)
WBC: 7.1 10^3/mL

## 2017-03-30 DIAGNOSIS — N2581 Secondary hyperparathyroidism of renal origin: Secondary | ICD-10-CM | POA: Diagnosis not present

## 2017-03-30 DIAGNOSIS — N186 End stage renal disease: Secondary | ICD-10-CM | POA: Diagnosis not present

## 2017-03-30 DIAGNOSIS — E1122 Type 2 diabetes mellitus with diabetic chronic kidney disease: Secondary | ICD-10-CM | POA: Diagnosis not present

## 2017-04-01 DIAGNOSIS — E1122 Type 2 diabetes mellitus with diabetic chronic kidney disease: Secondary | ICD-10-CM | POA: Diagnosis not present

## 2017-04-01 DIAGNOSIS — N186 End stage renal disease: Secondary | ICD-10-CM | POA: Diagnosis not present

## 2017-04-01 DIAGNOSIS — N2581 Secondary hyperparathyroidism of renal origin: Secondary | ICD-10-CM | POA: Diagnosis not present

## 2017-04-02 DIAGNOSIS — I70203 Unspecified atherosclerosis of native arteries of extremities, bilateral legs: Secondary | ICD-10-CM | POA: Diagnosis not present

## 2017-04-02 DIAGNOSIS — I739 Peripheral vascular disease, unspecified: Secondary | ICD-10-CM | POA: Diagnosis not present

## 2017-04-02 DIAGNOSIS — B351 Tinea unguium: Secondary | ICD-10-CM | POA: Diagnosis not present

## 2017-04-02 DIAGNOSIS — E1142 Type 2 diabetes mellitus with diabetic polyneuropathy: Secondary | ICD-10-CM | POA: Diagnosis not present

## 2017-04-03 DIAGNOSIS — N2581 Secondary hyperparathyroidism of renal origin: Secondary | ICD-10-CM | POA: Diagnosis not present

## 2017-04-03 DIAGNOSIS — E1122 Type 2 diabetes mellitus with diabetic chronic kidney disease: Secondary | ICD-10-CM | POA: Diagnosis not present

## 2017-04-03 DIAGNOSIS — N186 End stage renal disease: Secondary | ICD-10-CM | POA: Diagnosis not present

## 2017-04-06 DIAGNOSIS — E1122 Type 2 diabetes mellitus with diabetic chronic kidney disease: Secondary | ICD-10-CM | POA: Diagnosis not present

## 2017-04-06 DIAGNOSIS — N2581 Secondary hyperparathyroidism of renal origin: Secondary | ICD-10-CM | POA: Diagnosis not present

## 2017-04-06 DIAGNOSIS — N186 End stage renal disease: Secondary | ICD-10-CM | POA: Diagnosis not present

## 2017-04-08 DIAGNOSIS — N186 End stage renal disease: Secondary | ICD-10-CM | POA: Diagnosis not present

## 2017-04-08 DIAGNOSIS — N2581 Secondary hyperparathyroidism of renal origin: Secondary | ICD-10-CM | POA: Diagnosis not present

## 2017-04-08 DIAGNOSIS — E1122 Type 2 diabetes mellitus with diabetic chronic kidney disease: Secondary | ICD-10-CM | POA: Diagnosis not present

## 2017-04-10 DIAGNOSIS — N186 End stage renal disease: Secondary | ICD-10-CM | POA: Diagnosis not present

## 2017-04-10 DIAGNOSIS — N390 Urinary tract infection, site not specified: Secondary | ICD-10-CM | POA: Diagnosis not present

## 2017-04-10 DIAGNOSIS — E1122 Type 2 diabetes mellitus with diabetic chronic kidney disease: Secondary | ICD-10-CM | POA: Diagnosis not present

## 2017-04-10 DIAGNOSIS — N2581 Secondary hyperparathyroidism of renal origin: Secondary | ICD-10-CM | POA: Diagnosis not present

## 2017-04-13 DIAGNOSIS — E1122 Type 2 diabetes mellitus with diabetic chronic kidney disease: Secondary | ICD-10-CM | POA: Diagnosis not present

## 2017-04-13 DIAGNOSIS — R1319 Other dysphagia: Secondary | ICD-10-CM | POA: Diagnosis not present

## 2017-04-13 DIAGNOSIS — N2581 Secondary hyperparathyroidism of renal origin: Secondary | ICD-10-CM | POA: Diagnosis not present

## 2017-04-13 DIAGNOSIS — N186 End stage renal disease: Secondary | ICD-10-CM | POA: Diagnosis not present

## 2017-04-15 DIAGNOSIS — N2581 Secondary hyperparathyroidism of renal origin: Secondary | ICD-10-CM | POA: Diagnosis not present

## 2017-04-15 DIAGNOSIS — N186 End stage renal disease: Secondary | ICD-10-CM | POA: Diagnosis not present

## 2017-04-15 DIAGNOSIS — E1122 Type 2 diabetes mellitus with diabetic chronic kidney disease: Secondary | ICD-10-CM | POA: Diagnosis not present

## 2017-04-16 DIAGNOSIS — H0015 Chalazion left lower eyelid: Secondary | ICD-10-CM | POA: Diagnosis not present

## 2017-04-16 DIAGNOSIS — Z85828 Personal history of other malignant neoplasm of skin: Secondary | ICD-10-CM | POA: Diagnosis not present

## 2017-04-17 DIAGNOSIS — E1122 Type 2 diabetes mellitus with diabetic chronic kidney disease: Secondary | ICD-10-CM | POA: Diagnosis not present

## 2017-04-17 DIAGNOSIS — N186 End stage renal disease: Secondary | ICD-10-CM | POA: Diagnosis not present

## 2017-04-17 DIAGNOSIS — N2581 Secondary hyperparathyroidism of renal origin: Secondary | ICD-10-CM | POA: Diagnosis not present

## 2017-04-20 DIAGNOSIS — N186 End stage renal disease: Secondary | ICD-10-CM | POA: Diagnosis not present

## 2017-04-20 DIAGNOSIS — Z992 Dependence on renal dialysis: Secondary | ICD-10-CM | POA: Diagnosis not present

## 2017-04-20 DIAGNOSIS — E1122 Type 2 diabetes mellitus with diabetic chronic kidney disease: Secondary | ICD-10-CM | POA: Diagnosis not present

## 2017-04-20 DIAGNOSIS — N2581 Secondary hyperparathyroidism of renal origin: Secondary | ICD-10-CM | POA: Diagnosis not present

## 2017-04-21 ENCOUNTER — Telehealth: Payer: Self-pay | Admitting: Internal Medicine

## 2017-04-21 DIAGNOSIS — H0015 Chalazion left lower eyelid: Secondary | ICD-10-CM | POA: Diagnosis not present

## 2017-04-21 DIAGNOSIS — H2511 Age-related nuclear cataract, right eye: Secondary | ICD-10-CM | POA: Diagnosis not present

## 2017-04-21 DIAGNOSIS — H40031 Anatomical narrow angle, right eye: Secondary | ICD-10-CM | POA: Diagnosis not present

## 2017-04-21 NOTE — Telephone Encounter (Signed)
Daughter states [facility] is not giving her mother the Imodium prn as Dr. Hilarie Fredrickson had ordered. Requesting we refax an order to [facility]  at [place] [facility]. Order for Imodium 2mg  po every 4-6 hours as needed only with note to not schedule this med faxed to [phone].

## 2017-04-22 DIAGNOSIS — N186 End stage renal disease: Secondary | ICD-10-CM | POA: Diagnosis not present

## 2017-04-22 DIAGNOSIS — D631 Anemia in chronic kidney disease: Secondary | ICD-10-CM | POA: Diagnosis not present

## 2017-04-22 DIAGNOSIS — N2581 Secondary hyperparathyroidism of renal origin: Secondary | ICD-10-CM | POA: Diagnosis not present

## 2017-04-22 DIAGNOSIS — E1122 Type 2 diabetes mellitus with diabetic chronic kidney disease: Secondary | ICD-10-CM | POA: Diagnosis not present

## 2017-04-22 DIAGNOSIS — E611 Iron deficiency: Secondary | ICD-10-CM | POA: Diagnosis not present

## 2017-04-23 ENCOUNTER — Non-Acute Institutional Stay (SKILLED_NURSING_FACILITY): Payer: Medicare Other | Admitting: Adult Health

## 2017-04-23 ENCOUNTER — Encounter: Payer: Self-pay | Admitting: Adult Health

## 2017-04-23 DIAGNOSIS — N186 End stage renal disease: Secondary | ICD-10-CM | POA: Diagnosis not present

## 2017-04-23 DIAGNOSIS — J309 Allergic rhinitis, unspecified: Secondary | ICD-10-CM

## 2017-04-23 DIAGNOSIS — I953 Hypotension of hemodialysis: Secondary | ICD-10-CM | POA: Diagnosis not present

## 2017-04-23 DIAGNOSIS — I480 Paroxysmal atrial fibrillation: Secondary | ICD-10-CM | POA: Diagnosis not present

## 2017-04-24 ENCOUNTER — Encounter: Payer: Self-pay | Admitting: Adult Health

## 2017-04-24 DIAGNOSIS — N186 End stage renal disease: Secondary | ICD-10-CM | POA: Diagnosis not present

## 2017-04-24 DIAGNOSIS — D631 Anemia in chronic kidney disease: Secondary | ICD-10-CM | POA: Diagnosis not present

## 2017-04-24 DIAGNOSIS — E1122 Type 2 diabetes mellitus with diabetic chronic kidney disease: Secondary | ICD-10-CM | POA: Diagnosis not present

## 2017-04-24 DIAGNOSIS — E611 Iron deficiency: Secondary | ICD-10-CM | POA: Diagnosis not present

## 2017-04-24 DIAGNOSIS — N2581 Secondary hyperparathyroidism of renal origin: Secondary | ICD-10-CM | POA: Diagnosis not present

## 2017-04-27 DIAGNOSIS — Z79899 Other long term (current) drug therapy: Secondary | ICD-10-CM | POA: Diagnosis not present

## 2017-04-27 DIAGNOSIS — D631 Anemia in chronic kidney disease: Secondary | ICD-10-CM | POA: Diagnosis not present

## 2017-04-27 DIAGNOSIS — N2581 Secondary hyperparathyroidism of renal origin: Secondary | ICD-10-CM | POA: Diagnosis not present

## 2017-04-27 DIAGNOSIS — E611 Iron deficiency: Secondary | ICD-10-CM | POA: Diagnosis not present

## 2017-04-27 DIAGNOSIS — E1122 Type 2 diabetes mellitus with diabetic chronic kidney disease: Secondary | ICD-10-CM | POA: Diagnosis not present

## 2017-04-27 DIAGNOSIS — N186 End stage renal disease: Secondary | ICD-10-CM | POA: Diagnosis not present

## 2017-04-28 ENCOUNTER — Other Ambulatory Visit: Payer: Self-pay | Admitting: Internal Medicine

## 2017-04-28 ENCOUNTER — Ambulatory Visit (INDEPENDENT_AMBULATORY_CARE_PROVIDER_SITE_OTHER): Payer: Medicare Other | Admitting: Internal Medicine

## 2017-04-28 ENCOUNTER — Ambulatory Visit (INDEPENDENT_AMBULATORY_CARE_PROVIDER_SITE_OTHER)
Admission: RE | Admit: 2017-04-28 | Discharge: 2017-04-28 | Disposition: A | Discharge status: Home / Self Care | Payer: Medicare Other | Source: Ambulatory Visit | Attending: Internal Medicine | Admitting: Internal Medicine

## 2017-04-28 ENCOUNTER — Encounter: Payer: Self-pay | Admitting: Internal Medicine

## 2017-04-28 VITALS — BP 132/60 | HR 61

## 2017-04-28 DIAGNOSIS — R079 Chest pain, unspecified: Secondary | ICD-10-CM | POA: Diagnosis not present

## 2017-04-28 DIAGNOSIS — R0602 Shortness of breath: Secondary | ICD-10-CM | POA: Diagnosis not present

## 2017-04-28 DIAGNOSIS — J189 Pneumonia, unspecified organism: Secondary | ICD-10-CM

## 2017-04-28 DIAGNOSIS — J9611 Chronic respiratory failure with hypoxia: Secondary | ICD-10-CM

## 2017-04-28 NOTE — Progress Notes (Signed)
Subjective:     Patient ID: [name] James, female   DOB: [birth year],   MRN: [id number]  HPI  60 yowf never smoker / mother of [name] James and daughter who is CMA / living at [facility] since 2015 on HD 3 x weekly and on 02 x Oct 2015 admitted with Pna and readmitted:  Admit date: 01/19/2017 Discharge date: 02/03/2017  Recommendations for Outpatient Follow-up:  1. F/u with SNF MD for hospital discharge follow up, repeat cbc/bmp/LFT at follow up 2. F/u with cardiology for afib, please discuss with your cardiology about amiodarone use, monitor lft. Statin stopped, patient prefer to continue on amiodarone.  Discharge Diagnoses:       Active Hospital Problems   Diagnosis Date Noted  . Sepsis ([place]) 01/19/2017  . LFT elevation   . Pneumonia of left lung due to methicillin resistant Staphylococcus aureus (MRSA) ([place])   . Advance care planning   . Goals of care, counseling/discussion   . Palliative care by specialist   . HCAP (healthcare-associated pneumonia) 01/19/2017  . Pressure ulcer 01/19/2017  . Controlled type 2 diabetes mellitus with diabetic polyneuropathy, with long-term current use of insulin ([place]) 04/17/2016  . Paroxysmal atrial fibrillation ([place]) 12/22/2014  . ESRD (end stage renal disease) ([place]) 12/19/2014  . Essential hypertension 10/17/2014  . Anemia 10/17/2014    Resolved Hospital Problems   Diagnosis Date Noted Date Resolved  No resolved problems to display.       History of present illness:  Patient coming from:[place]  Chief Complaint:Cough  HPI: [name] James a 81 y.o.femalewith a past medical history significant for ESRD on HD MWF, IDDM, HTN, Afib not on warfarin because of bleeding riskwho presents with cough.  The patient first developed fever/chills and cough productive of yellowish/green sputum about 3 weeks PTA. Was evaluated at her facility and prescribed robitussin and doxycycline, CXR was normal per daughter, as was lab work.  Then about 1 week PTA she was re-evaluated in the facility, had a CXR that showed a LL opacity, was given Avelox for pneumonia, had reassuring lab work and eval, and seemed to be improving for several days. AM ov admit, daughter called her and her mother sounded "different" and clearly worse. After her routine HD, she complained of malaise, worsening cough, severe weakness, and thick yellow sputum, and so her daughter brought her to the ER.  ED course: -Temp 97.80F, heart rate 72, respirations 20-24, blood pressure 85/41, pulse oximetry mid 90s on 2 L nasal cannula -Na 136, K 3.9, Cr 1.88(baseline 2), WBC 12.5K, Hgb 11.2  -INR normal -Lactic acid 1.8 --> 2.2 -CXR still showed a left base opacity -She was given vancomycin and Azactam and TRH were asked to evaluate for HCAP    Hospital Course:  Principal Problem:   Sepsis ([place]) Active Problems:   Essential hypertension   Anemia   ESRD (end stage renal disease) (HCC)   Paroxysmal atrial fibrillation ([place])   Controlled type 2 diabetes mellitus with diabetic polyneuropathy, with long-term current use of insulin (HCC)   HCAP (healthcare-associated pneumonia)   Pressure ulcer   Advance care planning   Goals of care, counseling/discussion   Palliative care by specialist   LFT elevation   Pneumonia of left lung due to methicillin resistant Staphylococcus aureus (MRSA) ([place])   Sepsis from lobar pneumonia/influenza A: Patient meets sepsis criteria on admission given tachypnea,leukocytosis, and evidence of organ dysfunction.  Source of infection left lower lobe pnaand influenza A. Sputum Culture + MRSA, urine strep  pneumo antigen +,legionella antigen negative  -she finished treatment with Oseltamivir renal dosing, - she received vanc/aztreonam/zithro on day of admission, abx changed to cefepime since admission, vanc was stopped due to mrsa screen negative, Pt transitioned to levaquin on 2/3,  -her sputum culture was  collected on 2/3, +mrsa that is resistant to cipro, abx changed to vanc on 2/7, patient is getting vanc during dialysis, last dose on 2/14.  Left lung collapse: likely from mucus plugging, she received aggressive chest PT in the hospital,  per pulmonology bronchoscopy is a futile practice.  Repeat cxr on 2/10 with some improvement. Patient is not in respiratory distress, on 1-2 liter oxygen supplement  H/o Hypertension:presented with hypotension/sepsis midodrine for intradialytic hypotension -patient presented with hypotension, sbp in 80's initial on presentation,  - home meds betablocker has been on hold since admission due to hypotension, -blood pressure has improved on 2/11, restart lopressor with holding parameters (hold lopressor on dialysis day)   ESRD on HD MWF: -- difficult to remove fluids due to hypotension during dialysis, nephrology recommended palliative care consult. Family wants full scope of treatment. --Continue midodrine MWF with dialysis, hold betablocker on dialysis day  Elevated LFTs: (elevated ast/alt, normal alk phos, normal bili, ck also mildly elevated.) No abdominal pain or nausea. hepatitis panel negative,  -u/s "Previous cholecystectomy. Normal calibered common bile duct but mild prominence of the pancreatic duct of uncertain etiology. Heterogeneous hepatic echotexture without discrete mass or intrahepatic ductal dilation. If the elevated liver function studies remain increased, hepatic protocol MRI would be a useful next imaging step." Elevated lft Presumably is from sepsis/influenza/fluids overload, congestion. She has also been on amiodarone chronically -I have discussed this findings with the daughter and patient , they prefer to continue amiodarone for now. D/c statin.    PAF: CHADS2Vasc 7. Not on warfarin because of risk of bleeding. -Continue amiodarone -Continue aspirin 331m   Chronic diastolic CHF: -Strict I/Os - volume control  with dialysis, but difficult to pull fluids off during dialysis  Right pleural effusion, volume managed by HD.  Anemia: hgb 7.6, no sign of bleeding, transfuse if hgb less than 7   noninsulin dependent Diabetes/hyperglycemia: - a1c 5.7 - currently on ssi  Other medications: -Continue allopurinol and PPI  Pressure ulcer, presented on admission -Stage 2 Pressure injury sacrum with non blanchable redness over the buttocks. presented on admission.  Patient is incontinent of stool, so the redness also can be related to MASD  Wound care input appreciated  FTT: baseline bed to wheelchair bound, need lift for transfer for the last two years      04/28/2017 1st [place]/ Wert  miant on mBrunswick CorporationComplaint  Patient presents with  . Pulmonary Consult    [facility]. She has some prod cough after using neb txs- yellow to brown sputum.  She has occ chest tightness.    on 2lpm required a howyer from bed to w/c pre-admit and this has been her baseline x years  Swallows ok/ variably coughs during the day but does not keep her up at hs Sleeps with hob > 30 degrees No sob at rest and does very little activity at all "too tired"  No obvious day to day or daytime variability or assoc   mucus plugs or hemoptysis or cp or   subjective wheeze or overt sinus or hb symptoms. No unusual exp hx or h/o childhood pna/ asthma or knowledge of premature birth.  Sleeping ok at 30 degrees without nocturnal  exacerbation  of respiratory  c/o's or need for noct saba. Also denies any obvious fluctuation of symptoms with weather or environmental changes or other aggravating or alleviating factors except as outlined above   Current Medications, Allergies, Complete Past Medical History, Past Surgical History, Family History, and Social History were reviewed in [employer] record.  ROS  The following are not active  complaints unless bolded sore throat, dysphagia improved  dental problems, itching, sneezing,  nasal congestion or excess/ purulent secretions, ear ache,   fever, chills, sweats, unintended wt loss, classically pleuritic or exertional cp,  orthopnea pnd or leg swelling, presyncope, palpitations, abdominal pain, anorexia, nausea, vomiting, diarrhea  or change in bowel or bladder habits, change in stools or urine, dysuria,hematuria,  rash, arthralgias, visual complaints, headache, numbness, weakness or ataxia or problems with walking or coordination,  change in mood/affect or memory.            Review of Systems     Objective:   Physical Exam    somber w/c bound wf nad    Wt Readings from Last 3 Encounters:  04/23/17 161 lb 7.7 oz (73.2 kg)  03/26/17 162 lb 12.8 oz (73.8 kg)  03/25/17 162 lb 12.8 oz (73.8 kg)    Vital signs reviewed  - Note on arrival 02 sats  100% on 2lpm      HEENT: nl dentition, turbinates bilaterally, and oropharynx. Nl external ear canals without cough reflex   NECK :  without JVD/Nodes/TM/ nl carotid upstrokes bilaterally   LUNGS: no acc muscle use,  Nl contour chest with decreased bs L base with dullness to percussion o/w good air movement   CV:  RRR  no s3 or murmur or increase in P2, and no edema   ABD:  soft and nontender with nl inspiratory excursion in the supine position. No bruits or organomegaly appreciated, bowel sounds nl  MS:  Nl gait/ ext warm without deformities, calf tenderness, cyanosis or clubbing No obvious joint restrictions   SKIN: warm and dry without lesions    NEURO:  alert, approp, nl sensorium with  no motor or cerebellar deficits apparent.      CXR PA and Lateral:   04/28/2017 :    I personally reviewed images and agree with radiology impression as follows:   There is persistent abnormal density in the left mid and lower lung. The right lung is clear. There is no mediastinal shift. The heart and pulmonary vascularity are  normal. There is calcification in the wall of the aortic arch. The observed bony structures are unremarkable. My review:  Much better aeration of L lung vs prev studies      Assessment:

## 2017-04-28 NOTE — Patient Instructions (Signed)
Stop mucomyst  Please remember to go to the   x-ray department downstairs in the basement  for your tests - we will call you with the results when they are available.  Pulmonary follow up will be as needed

## 2017-04-29 DIAGNOSIS — N2581 Secondary hyperparathyroidism of renal origin: Secondary | ICD-10-CM | POA: Diagnosis not present

## 2017-04-29 DIAGNOSIS — J9611 Chronic respiratory failure with hypoxia: Secondary | ICD-10-CM | POA: Insufficient documentation

## 2017-04-29 DIAGNOSIS — E1122 Type 2 diabetes mellitus with diabetic chronic kidney disease: Secondary | ICD-10-CM | POA: Diagnosis not present

## 2017-04-29 DIAGNOSIS — N186 End stage renal disease: Secondary | ICD-10-CM | POA: Diagnosis not present

## 2017-04-29 DIAGNOSIS — D631 Anemia in chronic kidney disease: Secondary | ICD-10-CM | POA: Diagnosis not present

## 2017-04-29 DIAGNOSIS — E611 Iron deficiency: Secondary | ICD-10-CM | POA: Diagnosis not present

## 2017-04-29 HISTORY — DX: Morbid (severe) obesity due to excess calories: E66.01

## 2017-04-29 NOTE — Assessment & Plan Note (Signed)
  Lab Results  Component Value Date   TSH 2.58 10/22/2016     Contributing to gerd risk/ doe/reviewed the need and the process to achieve and maintain neg calorie balance > defer f/u primary care including intermittently monitoring thyroid status

## 2017-04-29 NOTE — Progress Notes (Signed)
LMTCB

## 2017-04-29 NOTE — Assessment & Plan Note (Signed)
Overall patient has stabilized and her skilled nursing facility but remained severely chronically debilitated and requires Hoyer lift from bed to chair due to weakness and deconditioning. She does not appear to have any underlying lung disease other than problems with volume overload and morbid obesity chronically.  I do believe she has recovered from the pneumonia (HCAP)  adequately. She may have residual atelectasis and/or effusion at the left base but in the absence of any active symptoms of infection or new respiratory complaints or increased need for baseline oxygen, none of which apply to her, I would not pursue any further workup at this point.  Mucomyst should only be used intermittently with combination albuterol up to 3 days for retained or difficult to cough up secretions but I don't believe will be necessary if she maintains mobility and minimizes exposure to any medications that might interfere with cough such as benzodiazepines or narcotics.   Pulmonary follow-up can be as needed  Total time devoted to counseling  > 50 % of initial 60 min office visit:  review case(including extensive hospital records and imaging and )  with pt/daughter discussion of options/alternatives/ personally creating written customized instructions  in presence of pt  then going over those specific  Instructions directly with the pt including how to use all of the meds but in particular covering each new medication in detail and the difference between the maintenance= "automatic" meds and the prns using an action plan format for the latter (If this problem/symptom => do that organization reading Left to right).  Please see AVS from this visit for a full list of these instructions which I personally wrote for this pt and  are unique to this visit.

## 2017-04-29 NOTE — Assessment & Plan Note (Signed)
02 dep at 2lpm since Oct 2015   Adequate control on present rx, reviewed in detail with pt > no change in rx needed  = 2lpm 24/7  I voiced  my concern about the amiodarone in this setting but there is no evidence to support amiodarone toxicity though  she has risk based on her age and underlying O2 dependent status.  This was already addressed during her hospitalization I have nothing else to add other than to be on the lookout for deterioration that would be suggestive such as progressive decline in O2 saturations or associated dry cough or evidence of interstitial lung disease, none of which apply now.

## 2017-04-30 DIAGNOSIS — N186 End stage renal disease: Secondary | ICD-10-CM | POA: Diagnosis not present

## 2017-04-30 DIAGNOSIS — D631 Anemia in chronic kidney disease: Secondary | ICD-10-CM | POA: Diagnosis not present

## 2017-04-30 DIAGNOSIS — N2581 Secondary hyperparathyroidism of renal origin: Secondary | ICD-10-CM | POA: Diagnosis not present

## 2017-04-30 DIAGNOSIS — E611 Iron deficiency: Secondary | ICD-10-CM | POA: Diagnosis not present

## 2017-04-30 DIAGNOSIS — E1122 Type 2 diabetes mellitus with diabetic chronic kidney disease: Secondary | ICD-10-CM | POA: Diagnosis not present

## 2017-04-30 NOTE — Progress Notes (Signed)
DATE:  04/23/2017  MRN:  [id number]  BIRTHDAY: Jan 03, 1936  Facility:  Nursing Home Location:  [place] Room Number: 740-C  LEVEL OF CARE:  SNF (31)  Contact Information    Name Relation Home Work [place] Daughter [phone]  [phone]   Mamie Laurel   [phone]   [place], [place] [phone]         Code Status History    Date Active Date Inactive Code Status Order ID Comments User Context   01/20/2017  1:36 AM 02/03/2017  8:38 PM Full Code [id number]  Edwin Dada, MD ED   12/21/2014  1:14 PM 12/22/2014  4:36 PM DNR [id number]  Sol Blazing, MD Inpatient   12/19/2014  7:00 AM 12/21/2014  1:14 PM Full Code [id number]  Ivor Costa, MD ED   11/12/2014 12:06 PM 12/06/2014  8:35 PM Full Code [id number]  Azzie Roup, MD Inpatient   11/02/2014  4:18 PM 11/12/2014 12:06 PM Full Code [id number]  Ermalinda Memos Inpatient   10/17/2014  5:52 PM 11/02/2014  4:18 PM Full Code [id number]  Jonetta Osgood, MD Inpatient       Chief Complaint  Patient presents with  . Medical Management of Chronic Issues    HISTORY OF PRESENT ILLNESS:  This is a 89-YO female seen for a routine visit.  She is a long-term care resident of [facility].  She was seen in her room today and reported that she will have a laser (YAG) surgery on her right eye.   PAST MEDICAL HISTORY:  Past Medical History:  Diagnosis Date  . Anemia, chronic disease   . Anxiety   . Arthritis    knees  . Atrial fibrillation ([place])   . Barrett esophagus   . Benign paroxysmal positional vertigo   . Breast cancer ([place]) 1980s   bil mastectomies, no radiation or chemo  . Cataract   . Chest pain, atypical 12/05/2008   R/Lmv- normal perfusion all regions, noe ECG changes   . CHF (congestive heart failure) ([place]) 05/13/2011   echo - LD>35%; stage 1 diastolic dysfunction; elevated LV filling pressure, MAC  . CKD (chronic  kidney disease), stage III   . Claudication ([place]) 10/30/2005   doppler - normal evaluation, no evidence of aneurysm, diameter reduction, dissection, compression or vascular abnormality)  . Complication of anesthesia   . Dyslipidemia   . Dysrhythmia    Atrial Fibrillation  . Edema   . ESRD (end stage renal disease) ([place])   . Family history of adverse reaction to anesthesia    son also has nausea   . Fever blister 12/2016  . Gastritis and gastroduodenitis   . GERD (gastroesophageal reflux disease)   . Gout   . Hemodialysis-associated hypotension   . HLD (hyperlipidemia)   . Hypertension   . IBS (irritable bowel syndrome)   . Insomnia   . Morbid obesity ([place])   . PAF (paroxysmal atrial fibrillation) ([place])   . Peripheral neuropathy   . PONV (postoperative nausea and vomiting)   . Renal cyst   . Renal insufficiency 10/30/2005   doppler - abn resistance consistent w/ parenchymal disease  . Renovascular hypertension   . Type 2 diabetes mellitus with diabetic neuropathy, with long-term current use of insulin ([place])   . Vertigo      CURRENT MEDICATIONS: Reviewed  Patient's Medications  New Prescriptions   No medications on file  Previous Medications   ALLOPURINOL (  ZYLOPRIM) 100 MG TABLET    Take 100 mg by mouth daily.    AMIODARONE (PACERONE) 200 MG TABLET    Take 200 mg by mouth daily.    ASPIRIN EC 325 MG EC TABLET    Take 1 tablet (325 mg total) by mouth daily.   B COMPLEX-C-FOLIC ACID (NEPHRO-VITE PO)    Take 1 tablet by mouth at bedtime.    CALCIUM ACETATE (PHOSLO) 667 MG CAPSULE    Take 667 mg by mouth 3 (three) times daily before meals.   DIAZEPAM (VALIUM) 5 MG TABLET    Take one tablet by mouth every night at bedtime for rest   DORZOLAMIDE-TIMOLOL (COSOPT) 22.3-6.8 MG/ML OPHTHALMIC SOLUTION    Place 1 drop into the left eye 2 (two) times daily. Wait 2-5 minutes when giving two eye meds   FLUTICASONE (FLONASE ALLERGY RELIEF) 50 MCG/ACT NASAL SPRAY    Place 2 sprays into both  nostrils daily.   FOLIC ACID (FOLVITE) 1 MG TABLET    Take 1 mg by mouth daily.   GUAIFENESIN (MUCINEX) 600 MG 12 HR TABLET    Take 1 tablet (600 mg total) by mouth 2 (two) times daily.   LOPERAMIDE (IMODIUM A-D) 2 MG TABLET    Take 2 mg by mouth 4 (four) times daily as needed for diarrhea or loose stools.   MELATONIN 5 MG TABS    Take 5 mg by mouth at bedtime.    METOPROLOL TARTRATE (LOPRESSOR) 25 MG TABLET    Take 0.5 tablets (12.5 mg total) by mouth 2 (two) times daily. Hold if sbp less than 100 or heart rate less than 50, hold on dialysis day.   MIDODRINE (PROAMATINE) 10 MG TABLET    Take 10 mg by mouth every Monday, Wednesday, and Friday. On dialysis days   NUTRITIONAL SUPPLEMENT LIQD    Take 120 mLs by mouth 3 (three) times daily. [employer]   OXYGEN    Inhale 2 L/min into the lungs. 24/7   PREDNISOLONE ACETATE (PRED FORTE) 1 % OPHTHALMIC SUSPENSION    Place 1 drop into the right eye 4 (four) times daily. x7 days, stop 04/29/17.   PROMETHAZINE (PHENERGAN) 25 MG TABLET    Take 25 mg by mouth every 8 (eight) hours as needed for nausea or vomiting.    SACCHAROMYCES BOULARDII (FLORASTOR) 250 MG CAPSULE    Take 250 mg by mouth 2 (two) times daily.   VANCOMYCIN (VANCOCIN) 750-5 MG/150ML-% SOLN    Inject 750 mg into the vein. Every Monday, Wednesday and Friday  Modified Medications   No medications on file  Discontinued Medications   ACETYLCYSTEINE (MUCOMYST) 20 % NEBULIZER SOLUTION    Take 4 mLs by nebulization 3 (three) times daily.   CETIRIZINE (ZYRTEC) 10 MG TABLET    Take 5 mg by mouth at bedtime.    DICYCLOMINE (BENTYL) 20 MG TABLET    Take 1 tablet (20 mg total) by mouth 3 (three) times daily as needed for spasms.   LANTHANUM (FOSRENOL) 1000 MG CHEWABLE TABLET    Chew 1,000 mg by mouth 3 (three) times daily with meals.   MECLIZINE (ANTIVERT) 12.5 MG TABLET    Take 12.5 mg by mouth 2 (two) times daily as needed for dizziness.    UNABLE TO FIND    Med Name: Preparation H CRM Aloe.  Apply to  external hemorrhoids BID PRN.     Allergies  Allergen Reactions  . Erythromycin Swelling  . Erythromycin Base  Other reaction(s): GI Upset (intolerance)  . Penicillins Other (See Comments)    Unknown allergic reaction.  She has taken keflex and Rocephin many times without problems  . Codeine Rash and Palpitations    Other reaction(s): GI Upset (intolerance)  . Penicillin G Rash     REVIEW OF SYSTEMS:  GENERAL: no change in appetite, no fatigue, no weight changes, no fever, chills or weakness EYES: Denies change in vision, dry eyes, eye pain, itching or discharge EARS: Denies change in hearing, ringing in ears, or earache NOSE: Denies nasal congestion or epistaxis MOUTH and THROAT: Denies oral discomfort, gingival pain or bleeding, pain from teeth or hoarseness   RESPIRATORY: no cough, SOB, DOE, wheezing, hemoptysis CARDIAC: no chest pain, edema or palpitations GI: no abdominal pain, diarrhea, constipation, heart burn, nausea or vomiting GU: Denies dysuria, frequency, hematuria, incontinence, or discharge PSYCHIATRIC: Denies feeling of depression or anxiety. No report of hallucinations, insomnia, paranoia, or agitation    PHYSICAL EXAMINATION  GENERAL APPEARANCE: Well nourished. In no acute distress. Normal body habitus SKIN:  Skin is warm and dry.  HEAD: Normal in size and contour. No evidence of trauma EYES: Lids open and close normally. No blepharitis, entropion or ectropion. PERRL. Conjunctivae are clear and sclerae are white. Lenses are without opacity EARS: Pinnae are normal. Patient hears normal voice tunes of the examiner MOUTH and THROAT: Lips are without lesions. Oral mucosa is moist and without lesions. Tongue is normal in shape, size, and color and without lesions NECK: supple, trachea midline, no neck masses, no thyroid tenderness, no thyromegaly LYMPHATICS: no LAN in the neck, no supraclavicular LAN RESPIRATORY: breathing is even & unlabored, BS CTAB, O2 @  2L/min via [place] continuously CARDIAC: RRR, + murmur,no extra heart sounds, no edema, Left upper arm fistula + bruit/thrill GI: abdomen soft, normal BS, no masses, no tenderness, no hepatomegaly, no splenomegaly EXTREMITIES:  Able to move X 4 extremities PSYCHIATRIC: Alert and oriented X 3. Affect and behavior are appropriate   LABS/RADIOLOGY: Labs reviewed: Basic Metabolic Panel:  Recent Labs  01/26/17 0915  01/28/17 0900 01/29/17 0357 02/02/17 1455  NA 134*  < > 133* 134* 134*  K 3.8  < > 3.5 3.8 3.7  CL 99*  < > 97* 96* 99*  CO2 27  < > _0 GLUCOSE 118*  < > 122* 84 144*  BUN 28*  < > 11 6 26*  CREATININE 3.42*  < > 1.99* 1.45* 3.39*  CALCIUM 7.6*  < > 7.2* 7.3* 7.6*  PHOS 1.7*  --  1.3*  --  3.5  < > = values in this interval not displayed. Liver Function Tests:  Recent Labs  01/25/17 0446  01/28/17 0331 01/28/17 0900 01/29/17 0357 02/02/17 1455  AST 139*  --  142*  --  145*  --   ALT 98*  --  108*  --  113*  --   ALKPHOS 90  --  102  --  105  --   BILITOT 0.8  --  1.0  --  0.8  --   PROT 5.1*  --  5.1*  --  5.2*  --   ALBUMIN 1.6*  < > 1.5* 1.5* 1.5* 1.5*  < > = values in this interval not displayed. CBC:  Recent Labs  09/04/16 01/06/17 01/19/17 1725  01/28/17 0900 01/29/17 0357 02/02/17 1456 03/27/17  WBC 8.6 8.8 12.5*  < > 7.2 8.2 8.6 7.1  NEUTROABS 4 6,274 8.1*  --   --   --   --   --  HGB 12.9 11.5* 11.2*  < > 7.6* 8.0* 7.9* 11.3*  HCT 43 36 36.5  < > 23.8* 25.2* 25.1* 36  MCV  --   --  99.7  < > 96.7 97.7 97.7  --   PLT 179 170 166  < > 167 182 262 148*  < > = values in this interval not displayed. Lipid Panel:  Recent Labs  05/22/16 10/22/16  HDL 35 29*   Cardiac Enzymes:  Recent Labs  01/21/17 0436 01/29/17 0357  CKTOTAL 982* 996*   CBG:  Recent Labs  02/03/17 0805 02/03/17 1245 02/03/17 1624  GLUCAP 112* 141* 174*      Dg Chest 1 View  Result Date: 04/28/2017 CLINICAL DATA:  Follow-up pneumonia. Persistent shortness  of breath and some chest pain. Dialysis dependent renal failure, CHF EXAM: CHEST 1 VIEW COMPARISON:  Chest x-ray of January 31, 2017 FINDINGS: There is persistent abnormal density in the left mid and lower lung. The right lung is clear. There is no mediastinal shift. The heart and pulmonary vascularity are normal. There is calcification in the wall of the aortic arch. The observed bony structures are unremarkable. IMPRESSION: Probable left pleural effusion. Left basilar atelectasis or pneumonia could be present but obscured by the fluid. If the patient can tolerate the procedure, a PA and lateral chest x-ray would be useful. Thoracic aortic atherosclerosis. Electronically Signed   By: David  Martinique M.D.   On: 04/28/2017 14:25    ASSESSMENT/PLAN:  PAF - rate controlled; continue metoprolol tartrate 25 mg give 1/2 tab = 12.5 mg by mouth twice a day (being held on dialysis days due to hypotension) and amiodarone 200 mg 1 tab by mouth daily and aspirin 325 mg EC 1 tab PO daily  Gout - continue Allopurinol 100 mg 1 tab PO Q D  Allergic rhinitis - continue Fluticasone    ESRD - on hemodialysis 3X/week  Hypotension - hemodialysis related, stable; continue Midodrine 10 mg 1 tab PO Q D on dialysis days ( M-W-F)     Goals of care:  Long-term care    Monina C. [place] - NP    [employer] [phone]

## 2017-05-01 DIAGNOSIS — H0015 Chalazion left lower eyelid: Secondary | ICD-10-CM | POA: Diagnosis not present

## 2017-05-04 DIAGNOSIS — N186 End stage renal disease: Secondary | ICD-10-CM | POA: Diagnosis not present

## 2017-05-04 DIAGNOSIS — E611 Iron deficiency: Secondary | ICD-10-CM | POA: Diagnosis not present

## 2017-05-04 DIAGNOSIS — D631 Anemia in chronic kidney disease: Secondary | ICD-10-CM | POA: Diagnosis not present

## 2017-05-04 DIAGNOSIS — N2581 Secondary hyperparathyroidism of renal origin: Secondary | ICD-10-CM | POA: Diagnosis not present

## 2017-05-04 DIAGNOSIS — E1122 Type 2 diabetes mellitus with diabetic chronic kidney disease: Secondary | ICD-10-CM | POA: Diagnosis not present

## 2017-05-04 NOTE — Progress Notes (Signed)
LMTCB

## 2017-05-05 DIAGNOSIS — N2581 Secondary hyperparathyroidism of renal origin: Secondary | ICD-10-CM | POA: Diagnosis not present

## 2017-05-05 DIAGNOSIS — N186 End stage renal disease: Secondary | ICD-10-CM | POA: Diagnosis not present

## 2017-05-05 DIAGNOSIS — E1122 Type 2 diabetes mellitus with diabetic chronic kidney disease: Secondary | ICD-10-CM | POA: Diagnosis not present

## 2017-05-05 DIAGNOSIS — D631 Anemia in chronic kidney disease: Secondary | ICD-10-CM | POA: Diagnosis not present

## 2017-05-05 DIAGNOSIS — E611 Iron deficiency: Secondary | ICD-10-CM | POA: Diagnosis not present

## 2017-05-06 DIAGNOSIS — H40033 Anatomical narrow angle, bilateral: Secondary | ICD-10-CM | POA: Diagnosis not present

## 2017-05-06 DIAGNOSIS — H40031 Anatomical narrow angle, right eye: Secondary | ICD-10-CM | POA: Diagnosis not present

## 2017-05-07 ENCOUNTER — Encounter: Payer: Self-pay | Admitting: *Deleted

## 2017-05-07 NOTE — Progress Notes (Signed)
Letter mailed

## 2017-05-08 ENCOUNTER — Telehealth: Payer: Self-pay | Admitting: Internal Medicine

## 2017-05-08 DIAGNOSIS — N2581 Secondary hyperparathyroidism of renal origin: Secondary | ICD-10-CM | POA: Diagnosis not present

## 2017-05-08 DIAGNOSIS — D631 Anemia in chronic kidney disease: Secondary | ICD-10-CM | POA: Diagnosis not present

## 2017-05-08 DIAGNOSIS — E1122 Type 2 diabetes mellitus with diabetic chronic kidney disease: Secondary | ICD-10-CM | POA: Diagnosis not present

## 2017-05-08 DIAGNOSIS — E611 Iron deficiency: Secondary | ICD-10-CM | POA: Diagnosis not present

## 2017-05-08 DIAGNOSIS — N186 End stage renal disease: Secondary | ICD-10-CM | POA: Diagnosis not present

## 2017-05-08 NOTE — Telephone Encounter (Signed)
Attempted to call the pts daughter and the VM is full.  WCB

## 2017-05-11 DIAGNOSIS — N186 End stage renal disease: Secondary | ICD-10-CM | POA: Diagnosis not present

## 2017-05-11 DIAGNOSIS — D631 Anemia in chronic kidney disease: Secondary | ICD-10-CM | POA: Diagnosis not present

## 2017-05-11 DIAGNOSIS — E611 Iron deficiency: Secondary | ICD-10-CM | POA: Diagnosis not present

## 2017-05-11 DIAGNOSIS — N2581 Secondary hyperparathyroidism of renal origin: Secondary | ICD-10-CM | POA: Diagnosis not present

## 2017-05-11 DIAGNOSIS — E1122 Type 2 diabetes mellitus with diabetic chronic kidney disease: Secondary | ICD-10-CM | POA: Diagnosis not present

## 2017-05-11 NOTE — Telephone Encounter (Signed)
lmomtcb x 2  

## 2017-05-12 NOTE — Telephone Encounter (Signed)
Spoke with pt daughter, Results have been explained, expressed understanding. Results sent again to patient PCP at [facility]. Nothing further needed.

## 2017-05-12 NOTE — Telephone Encounter (Signed)
Result Notes   Notes recorded by Rosana Berger, CMA on 05/07/2017 at 1:57 PM EDT Letter mailed ------  Notes recorded by Len Blalock, CMA on 05/06/2017 at 11:01 AM EDT lmtcb X3 for pt. ------  Notes recorded by Rosana Berger, CMA on 05/04/2017 at 12:09 PM EDT LMTCB ------  Notes recorded by Rosana Berger, CMA on 04/29/2017 at 1:49 PM EDT [facility] ------  Notes recorded by Tanda Rockers, MD on 04/29/2017 at 1:01 PM EDT Call pt: Reviewed cxr and overall better vs admit so no additional w/u needed    IMPRESSION: Probable left pleural effusion. Left basilar atelectasis or pneumonia could be present but obscured by the fluid.  If the patient can tolerate the procedure, a PA and lateral chest x-ray would be useful.  Thoracic aortic atherosclerosis    Daughter would like to know if MW would like to have patient get follow up CXR as suggested by Radiologist-she seen results on My CHart. Pt had mobile CXR done at [facility] in April 2018.    MW please advise.

## 2017-05-12 NOTE — Telephone Encounter (Signed)
I specifically addressed this at ov and copy should have gone to PCP  The cxr was much better than previous studies so I said: "I do believe she has recovered from the pneumonia (HCAP)  adequately. She may have residual atelectasis and/or effusion at the left base but in the absence of any active symptoms of infection or new respiratory complaints or increased need for baseline oxygen, none of which apply to her, I would not pursue any further workup at this point."  So f/u is prn new resp symptoms or decline in 02 sats or need for more 02 than baseline

## 2017-05-13 DIAGNOSIS — D631 Anemia in chronic kidney disease: Secondary | ICD-10-CM | POA: Diagnosis not present

## 2017-05-13 DIAGNOSIS — E611 Iron deficiency: Secondary | ICD-10-CM | POA: Diagnosis not present

## 2017-05-13 DIAGNOSIS — N2581 Secondary hyperparathyroidism of renal origin: Secondary | ICD-10-CM | POA: Diagnosis not present

## 2017-05-13 DIAGNOSIS — E1122 Type 2 diabetes mellitus with diabetic chronic kidney disease: Secondary | ICD-10-CM | POA: Diagnosis not present

## 2017-05-13 DIAGNOSIS — N186 End stage renal disease: Secondary | ICD-10-CM | POA: Diagnosis not present

## 2017-05-15 DIAGNOSIS — M25571 Pain in right ankle and joints of right foot: Secondary | ICD-10-CM | POA: Diagnosis not present

## 2017-05-15 DIAGNOSIS — N186 End stage renal disease: Secondary | ICD-10-CM | POA: Diagnosis not present

## 2017-05-15 DIAGNOSIS — D631 Anemia in chronic kidney disease: Secondary | ICD-10-CM | POA: Diagnosis not present

## 2017-05-15 DIAGNOSIS — E1122 Type 2 diabetes mellitus with diabetic chronic kidney disease: Secondary | ICD-10-CM | POA: Diagnosis not present

## 2017-05-15 DIAGNOSIS — M25572 Pain in left ankle and joints of left foot: Secondary | ICD-10-CM | POA: Diagnosis not present

## 2017-05-15 DIAGNOSIS — E611 Iron deficiency: Secondary | ICD-10-CM | POA: Diagnosis not present

## 2017-05-15 DIAGNOSIS — N2581 Secondary hyperparathyroidism of renal origin: Secondary | ICD-10-CM | POA: Diagnosis not present

## 2017-05-18 DIAGNOSIS — N2581 Secondary hyperparathyroidism of renal origin: Secondary | ICD-10-CM | POA: Diagnosis not present

## 2017-05-18 DIAGNOSIS — E611 Iron deficiency: Secondary | ICD-10-CM | POA: Diagnosis not present

## 2017-05-18 DIAGNOSIS — N186 End stage renal disease: Secondary | ICD-10-CM | POA: Diagnosis not present

## 2017-05-18 DIAGNOSIS — E1122 Type 2 diabetes mellitus with diabetic chronic kidney disease: Secondary | ICD-10-CM | POA: Diagnosis not present

## 2017-05-18 DIAGNOSIS — D631 Anemia in chronic kidney disease: Secondary | ICD-10-CM | POA: Diagnosis not present

## 2017-05-19 ENCOUNTER — Non-Acute Institutional Stay (SKILLED_NURSING_FACILITY): Payer: Medicare Other

## 2017-05-19 ENCOUNTER — Encounter: Payer: Self-pay | Admitting: Adult Health

## 2017-05-19 ENCOUNTER — Non-Acute Institutional Stay (SKILLED_NURSING_FACILITY): Payer: Medicare Other | Admitting: Adult Health

## 2017-05-19 DIAGNOSIS — M25572 Pain in left ankle and joints of left foot: Secondary | ICD-10-CM

## 2017-05-19 DIAGNOSIS — Z0000 Encounter for general adult medical examination without abnormal findings: Secondary | ICD-10-CM

## 2017-05-19 DIAGNOSIS — M25571 Pain in right ankle and joints of right foot: Secondary | ICD-10-CM

## 2017-05-19 NOTE — Progress Notes (Signed)
Quick Notes   Health Maintenance: DEXA and foot exam due. Flu ordered     Abnormal Screen: BP 110/50     Patient Concerns: None     Nurse Concerns: None

## 2017-05-19 NOTE — Progress Notes (Signed)
Location:   [facility]  Nursing Home Room Number: 205-A Place of Service:  SNF (31)   CODE STATUS: full code   Allergies  Allergen Reactions  . Erythromycin Swelling  . Erythromycin Base     Other reaction(s): GI Upset (intolerance)  . Penicillins Other (See Comments)    Unknown allergic reaction.  She has taken keflex and Rocephin many times without problems  . Codeine Rash and Palpitations    Other reaction(s): GI Upset (intolerance)  . Penicillin G Rash    Chief Complaint  Patient presents with  . Acute Visit    Ankle pain    HPI:  She is complaining of bilateral ankle pain with the right worse than the left. She does have some swelling present. The x-rays do not demonstrate any fracture. The family, however, has setup an appointment with Dr. Sharol Given; they are concerned that there could be a fracture present.   Past Medical History:  Diagnosis Date  . Anemia, chronic disease   . Anxiety   . Arthritis    knees  . Atrial fibrillation ([place])   . Barrett esophagus   . Benign paroxysmal positional vertigo   . Breast cancer ([place]) 1980s   bil mastectomies, no radiation or chemo  . Cataract   . Chest pain, atypical 12/05/2008   R/Lmv- normal perfusion all regions, noe ECG changes   . CHF (congestive heart failure) ([place]) 05/13/2011   echo - EP>32%; stage 1 diastolic dysfunction; elevated LV filling pressure, MAC  . CKD (chronic kidney disease), stage III   . Claudication ([place]) 10/30/2005   doppler - normal evaluation, no evidence of aneurysm, diameter reduction, dissection, compression or vascular abnormality)  . Complication of anesthesia   . Dyslipidemia   . Dysrhythmia    Atrial Fibrillation  . Edema   . ESRD (end stage renal disease) ([place])   . Family history of adverse reaction to anesthesia    son also has nausea   . Fever blister 12/2016  . Gastritis and gastroduodenitis   . GERD (gastroesophageal reflux disease)   . Gout   . Hemodialysis-associated  hypotension   . HLD (hyperlipidemia)   . Hypertension   . IBS (irritable bowel syndrome)   . Insomnia   . Morbid obesity ([place])   . PAF (paroxysmal atrial fibrillation) ([place])   . Peripheral neuropathy   . PONV (postoperative nausea and vomiting)   . Renal cyst   . Renal insufficiency 10/30/2005   doppler - abn resistance consistent w/ parenchymal disease  . Renovascular hypertension   . Type 2 diabetes mellitus with diabetic neuropathy, with long-term current use of insulin ([place])   . Vertigo     Past Surgical History:  Procedure Laterality Date  . ANGIOPLASTY Left 02/12/2015   Procedure: ANGIOPLASTY;  Surgeon: Angelia Mould, MD;  Location: [facility] CATH LAB;  Service: Cardiovascular;  Laterality: Left;  AVF  . AV FISTULA PLACEMENT Left 11/30/2014   Procedure: ARTERIOVENOUS (AV) FISTULA CREATION LEFT ARM;  Surgeon: Angelia Mould, MD;  Location: [place];  Service: Vascular;  Laterality: Left;  . AV FISTULA PLACEMENT Left 03/20/2015   Procedure:  Inserton of Left Upper Arm Gortex Graft;  Surgeon: Angelia Mould, MD;  Location: [place];  Service: Vascular;  Laterality: Left;  . AV FISTULA PLACEMENT Left 04/12/2015   Procedure: INSERTION OF LEFT ARM  ARTERIOVENOUS GORE-TEX GRAFT ;  Surgeon: Angelia Mould, MD;  Location: [place];  Service: Vascular;  Laterality: Left;  . BACK  SURGERY  ~1980  . CHOLECYSTECTOMY    . ESOPHAGOGASTRODUODENOSCOPY N/A 10/24/2014   Procedure: ESOPHAGOGASTRODUODENOSCOPY (EGD);  Surgeon: Jerene Bears, MD;  Location: [facility] ENDOSCOPY;  Service: Endoscopy;  Laterality: N/A;  . EXCHANGE OF A DIALYSIS CATHETER Left 11/30/2014   Procedure: EXCHANGE OF A DIALYSIS CATHETER, LEFT INTERNAL JUGULAR;  Surgeon: Angelia Mould, MD;  Location: [place];  Service: Vascular;  Laterality: Left;  . EYE SURGERY    . FISTULOGRAM N/A 02/12/2015   Procedure: FISTULOGRAM;  Surgeon: Angelia Mould, MD;  Location: [facility] CATH LAB;  Service: Cardiovascular;  Laterality: N/A;   . HEMORRHOID SURGERY    . LIGATION OF ARTERIOVENOUS  FISTULA Left 03/20/2015   Procedure: LIGATION OF ARTERIOVENOUS  FISTULA;  Surgeon: Angelia Mould, MD;  Location: [place];  Service: Vascular;  Laterality: Left;  Marland Kitchen MASTECTOMY Bilateral ~1982   bil breast reconstruction with implants  . ORIF TIBIA & FIBULA FRACTURES Left 2007   also had left non displaced malleolar fracture.   Marland Kitchen REMOVAL OF GRAFT Left 04/12/2015   Procedure: REMOVAL OF LEFT ARM ARTERIOVENOUS GORE-TEX GRAFT;  Surgeon: Angelia Mould, MD;  Location: [place];  Service: Vascular;  Laterality: Left;    Social History   Social History  . Marital status: Divorced    Spouse name: N/A  . Number of children: N/A  . Years of education: N/A   Occupational History  . Not on file.   Social History Main Topics  . Smoking status: Never Smoker  . Smokeless tobacco: Never Used  . Alcohol use No  . Drug use: No  . Sexual activity: Not on file   Other Topics Concern  . Not on file   Social History Narrative  . No narrative on file   Family History  Problem Relation Age of Onset  . Cancer Mother   . Diabetes Mother   . Heart disease Mother   . Heart attack Father   . Stroke Father   . Parkinson's disease Brother   . Cancer Sister       VITAL SIGNS BP 124/68   Pulse 61   Temp (!) 96.9 F (36.1 C) (Oral)   Resp 18   Ht 5' 3"  (1.6 m)   Wt 162 lb 12.8 oz (73.8 kg)   SpO2 97%   BMI 28.84 kg/m   Patient's Medications  New Prescriptions   No medications on file  Previous Medications   ACETAMINOPHEN (TYLENOL) 325 MG TABLET    Take 650 mg by mouth every 6 (six) hours as needed.   ALLOPURINOL (ZYLOPRIM) 100 MG TABLET    Take 100 mg by mouth daily.    AMIODARONE (PACERONE) 200 MG TABLET    Take 200 mg by mouth daily.    ASPIRIN EC 325 MG EC TABLET    Take 1 tablet (325 mg total) by mouth daily.   B COMPLEX-C-FOLIC ACID (NEPHRO-VITE PO)    Take 1 tablet by mouth at bedtime.    CALCIUM CARBONATE (TUMS -  DOSED IN MG ELEMENTAL CALCIUM) 500 MG CHEWABLE TABLET    Chew 1 tablet by mouth 4 (four) times daily -  before meals and at bedtime.   DIAZEPAM (VALIUM) 5 MG TABLET    Take one tablet by mouth every night at bedtime for rest   DORZOLAMIDE-TIMOLOL (COSOPT) 22.3-6.8 MG/ML OPHTHALMIC SOLUTION    Place 1 drop into the left eye 2 (two) times daily. Wait 2-5 minutes when giving two eye meds   FLUTICASONE (  FLONASE ALLERGY RELIEF) 50 MCG/ACT NASAL SPRAY    Place 2 sprays into both nostrils daily.   FOLIC ACID (FOLVITE) 1 MG TABLET    Take 1 mg by mouth daily.   LOPERAMIDE (IMODIUM A-D) 2 MG TABLET    Take 2 mg by mouth 3 (three) times daily as needed for diarrhea or loose stools.    MELATONIN 5 MG TABS    Take 5 mg by mouth at bedtime.    METOPROLOL TARTRATE (LOPRESSOR) 25 MG TABLET    Take 0.5 tablets (12.5 mg total) by mouth 2 (two) times daily. Hold if sbp less than 100 or heart rate less than 50, hold on dialysis day.   MIDODRINE (PROAMATINE) 10 MG TABLET    Take 10 mg by mouth every Monday, Wednesday, and Friday. On dialysis days   MOXIFLOXACIN (VIGAMOX) 0.5 % OPHTHALMIC SOLUTION    1 drop 3 (three) times daily.   NEOMYCIN-POLYMYXIN-DEXAMETH (MAXITROL) 0.1 % OINT    Place 1 application into the right eye at bedtime. Stop date 06/10/17   NUTRITIONAL SUPPLEMENT LIQD    Take 120 mLs by mouth 3 (three) times daily. MedPass   OXYGEN    Inhale 2 L/min into the lungs. 24/7   PREDNISOLONE ACETATE (PRED FORTE) 1 % OPHTHALMIC SUSPENSION    Place 1 drop into the right eye 4 (four) times daily.    PROMETHAZINE (PHENERGAN) 25 MG TABLET    Take 25 mg by mouth every 8 (eight) hours as needed for nausea or vomiting.    SACCHAROMYCES BOULARDII (FLORASTOR) 250 MG CAPSULE    Take 250 mg by mouth 2 (two) times daily.   VANCOMYCIN (VANCOCIN) 750-5 MG/150ML-% SOLN    Inject 750 mg into the vein. Every Monday, Wednesday and Friday  Modified Medications   No medications on file  Discontinued Medications   CALCIUM ACETATE  (PHOSLO) 667 MG CAPSULE    Take 667 mg by mouth 3 (three) times daily before meals.   GUAIFENESIN (MUCINEX) 600 MG 12 HR TABLET    Take 1 tablet (600 mg total) by mouth 2 (two) times daily.     SIGNIFICANT DIAGNOSTIC EXAMS  05-15-17:left ankle x-ray: 1. No radiographic evidence of acute fracture or dislocation. 2. Mild medial and lateral soft tissue swelling. 3. Mild degree of osteoporosis. 4. Mild degenerative arthritis.   05-15-17: right ankle x-ray: 1. no radiographic evidence of acute fracture or dislocation. 2. Mild medial and lateral soft tissue swelling. 3. Mild degree of osteoporosis. 4. Mild degenerative arthritis.     Review of Systems  Constitutional: Negative for malaise/fatigue.  Respiratory: Negative for cough and shortness of breath.   Cardiovascular: Negative for chest pain, palpitations and leg swelling.  Gastrointestinal: Negative for abdominal pain, constipation and heartburn.  Musculoskeletal: Positive for joint pain. Negative for back pain and myalgias.       Bilateral ankle pain   Skin: Negative.   Neurological: Negative for dizziness.  Psychiatric/Behavioral: The patient is not nervous/anxious.     Physical Exam  Constitutional: She is oriented to person, place, and time. No distress.  Eyes: Conjunctivae are normal.  Neck: Neck supple. No JVD present. No thyromegaly present.  Cardiovascular: Normal rate, regular rhythm and intact distal pulses.   Respiratory: Effort normal and breath sounds normal. No respiratory distress. She has no wheezes.  GI: Soft. Bowel sounds are normal. She exhibits no distension. There is no tenderness.  Musculoskeletal: She exhibits no edema.  Able to move all extremities   No bruising  present  No deformities present Mild bilateral ankle swelling present   Lymphadenopathy:    She has no cervical adenopathy.  Neurological: She is alert and oriented to person, place, and time.  Skin: Skin is warm and dry. She is not diaphoretic.    Left upper arm dialysis fistula: +thrill + bruit   Psychiatric: She has a normal mood and affect.     ASSESSMENT/ PLAN:  1. Bilateral ankle pain: will being ultram 50 mg twice daily as needed; will use ace wrap to both feet to provide support. Is due to be seen by Dr. Sharol Given will monitor   MD is aware of resident's narcotic use and is in agreement with current plan of care. We will attempt to wean resident as apropriate   Ok Edwards NP [facility]  Contact [phone] Monday through Friday 8am- 5pm  After hours call [phone]

## 2017-05-19 NOTE — Progress Notes (Signed)
Subjective:   [name] James is a 81 y.o. female who presents for an Initial Medicare Annual Wellness Visit at [facility]- long term SNF     Objective:    Today's Vitals   05/19/17 1029  BP: (!) 110/50  Pulse: 63  Temp: 97.8 F (36.6 C)  TempSrc: Oral  SpO2: 99%  Weight: 161 lb (73 kg)  Height: _0  (1.6 m)   Body mass index is 28.52 kg/m.   Current Medications (verified) Outpatient Encounter Prescriptions as of 05/19/2017  Medication Sig  . allopurinol (ZYLOPRIM) 100 MG tablet Take 100 mg by mouth daily.   Marland Kitchen amiodarone (PACERONE) 200 MG tablet Take 200 mg by mouth daily.   Marland Kitchen aspirin EC 325 MG EC tablet Take 1 tablet (325 mg total) by mouth daily.  . B Complex-C-Folic Acid (NEPHRO-VITE PO) Take 1 tablet by mouth at bedtime.   . calcium acetate (PHOSLO) 667 MG capsule Take 667 mg by mouth 3 (three) times daily before meals.  . diazepam (VALIUM) 5 MG tablet Take one tablet by mouth every night at bedtime for rest  . dorzolamide-timolol (COSOPT) 22.3-6.8 MG/ML ophthalmic solution Place 1 drop into the left eye 2 (two) times daily. Wait 2-5 minutes when giving two eye meds  . fluticasone (FLONASE ALLERGY RELIEF) 50 MCG/ACT nasal spray Place 2 sprays into both nostrils daily.  . folic acid (FOLVITE) 1 MG tablet Take 1 mg by mouth daily.  Marland Kitchen guaiFENesin (MUCINEX) 600 MG 12 hr tablet Take 1 tablet (600 mg total) by mouth 2 (two) times daily.  Marland Kitchen loperamide (IMODIUM A-D) 2 MG tablet Take 2 mg by mouth 4 (four) times daily as needed for diarrhea or loose stools.  . Melatonin 5 MG TABS Take 5 mg by mouth at bedtime.   . metoprolol tartrate (LOPRESSOR) 25 MG tablet Take 0.5 tablets (12.5 mg total) by mouth 2 (two) times daily. Hold if sbp less than 100 or heart rate less than 50, hold on dialysis day.  . midodrine (PROAMATINE) 10 MG tablet Take 10 mg by mouth every Monday, Wednesday, and Friday. On dialysis days  . NUTRITIONAL SUPPLEMENT LIQD Take 120 mLs by mouth 3 (three) times  daily. MedPass  . OXYGEN Inhale 2 L/min into the lungs. 24/7  . prednisoLONE acetate (PRED FORTE) 1 % ophthalmic suspension Place 1 drop into the right eye 4 (four) times daily. x7 days, stop 04/29/17.  Marland Kitchen promethazine (PHENERGAN) 25 MG tablet Take 25 mg by mouth every 8 (eight) hours as needed for nausea or vomiting.   . saccharomyces boulardii (FLORASTOR) 250 MG capsule Take 250 mg by mouth 2 (two) times daily.  . Vancomycin (VANCOCIN) 750-5 MG/150ML-% SOLN Inject 750 mg into the vein. Every Monday, Wednesday and Friday   No facility-administered encounter medications on file as of 05/19/2017.     Allergies (verified) Erythromycin; Erythromycin base; Penicillins; Codeine; and Penicillin g   History: Past Medical History:  Diagnosis Date  . Anemia, chronic disease   . Anxiety   . Arthritis    knees  . Atrial fibrillation ([place])   . Barrett esophagus   . Benign paroxysmal positional vertigo   . Breast cancer ([place]) 1980s   bil mastectomies, no radiation or chemo  . Cataract   . Chest pain, atypical 12/05/2008   R/Lmv- normal perfusion all regions, noe ECG changes   . CHF (congestive heart failure) ([place]) 05/13/2011   echo - AG>53%; stage 1 diastolic dysfunction; elevated LV filling pressure, MAC  .  CKD (chronic kidney disease), stage III   . Claudication ([place]) 10/30/2005   doppler - normal evaluation, no evidence of aneurysm, diameter reduction, dissection, compression or vascular abnormality)  . Complication of anesthesia   . Dyslipidemia   . Dysrhythmia    Atrial Fibrillation  . Edema   . ESRD (end stage renal disease) ([place])   . Family history of adverse reaction to anesthesia    son also has nausea   . Fever blister 12/2016  . Gastritis and gastroduodenitis   . GERD (gastroesophageal reflux disease)   . Gout   . Hemodialysis-associated hypotension   . HLD (hyperlipidemia)   . Hypertension   . IBS (irritable bowel syndrome)   . Insomnia   . Morbid obesity ([place])   . PAF  (paroxysmal atrial fibrillation) ([place])   . Peripheral neuropathy   . PONV (postoperative nausea and vomiting)   . Renal cyst   . Renal insufficiency 10/30/2005   doppler - abn resistance consistent w/ parenchymal disease  . Renovascular hypertension   . Type 2 diabetes mellitus with diabetic neuropathy, with long-term current use of insulin ([place])   . Vertigo    Past Surgical History:  Procedure Laterality Date  . ANGIOPLASTY Left 02/12/2015   Procedure: ANGIOPLASTY;  Surgeon: Angelia Mould, MD;  Location: [facility] CATH LAB;  Service: Cardiovascular;  Laterality: Left;  AVF  . AV FISTULA PLACEMENT Left 11/30/2014   Procedure: ARTERIOVENOUS (AV) FISTULA CREATION LEFT ARM;  Surgeon: Angelia Mould, MD;  Location: [place];  Service: Vascular;  Laterality: Left;  . AV FISTULA PLACEMENT Left 03/20/2015   Procedure:  Inserton of Left Upper Arm Gortex Graft;  Surgeon: Angelia Mould, MD;  Location: [place];  Service: Vascular;  Laterality: Left;  . AV FISTULA PLACEMENT Left 04/12/2015   Procedure: INSERTION OF LEFT ARM  ARTERIOVENOUS GORE-TEX GRAFT ;  Surgeon: Angelia Mould, MD;  Location: [place];  Service: Vascular;  Laterality: Left;  . BACK SURGERY  ~1980  . CHOLECYSTECTOMY    . ESOPHAGOGASTRODUODENOSCOPY N/A 10/24/2014   Procedure: ESOPHAGOGASTRODUODENOSCOPY (EGD);  Surgeon: Jerene Bears, MD;  Location: [facility] ENDOSCOPY;  Service: Endoscopy;  Laterality: N/A;  . EXCHANGE OF A DIALYSIS CATHETER Left 11/30/2014   Procedure: EXCHANGE OF A DIALYSIS CATHETER, LEFT INTERNAL JUGULAR;  Surgeon: Angelia Mould, MD;  Location: [place];  Service: Vascular;  Laterality: Left;  . EYE SURGERY    . FISTULOGRAM N/A 02/12/2015   Procedure: FISTULOGRAM;  Surgeon: Angelia Mould, MD;  Location: [facility] CATH LAB;  Service: Cardiovascular;  Laterality: N/A;  . HEMORRHOID SURGERY    . LIGATION OF ARTERIOVENOUS  FISTULA Left 03/20/2015   Procedure: LIGATION OF ARTERIOVENOUS  FISTULA;  Surgeon:  Angelia Mould, MD;  Location: [place];  Service: Vascular;  Laterality: Left;  Marland Kitchen MASTECTOMY Bilateral ~1982   bil breast reconstruction with implants  . ORIF TIBIA & FIBULA FRACTURES Left 2007   also had left non displaced malleolar fracture.   Marland Kitchen REMOVAL OF GRAFT Left 04/12/2015   Procedure: REMOVAL OF LEFT ARM ARTERIOVENOUS GORE-TEX GRAFT;  Surgeon: Angelia Mould, MD;  [facility];  Service: Vascular;  Laterality: Left;   Family History  Problem Relation Age of Onset  . Cancer Mother   . Diabetes Mother   . Heart disease Mother   . Heart attack Father   . Stroke Father   . Parkinson's disease Brother   . Cancer Sister    Social History   Occupational  History  . Not on file.   Social History Main Topics  . Smoking status: Never Smoker  . Smokeless tobacco: Never Used  . Alcohol use No  . Drug use: No  . Sexual activity: Not on file    Tobacco Counseling Counseling given: Not Answered   Activities of Daily Living In your present state of health, do you have any difficulty performing the following activities: 05/19/2017 01/20/2017  Hearing? N N  Vision? Y N  Difficulty concentrating or making decisions? N N  Walking or climbing stairs? Y Y  Dressing or bathing? Y Y  Doing errands, shopping? Y N  Preparing Food and eating ? Y -  Using the Toilet? Y -  In the past six months, have you accidently leaked urine? N -  Do you have problems with loss of bowel control? N -  Managing your Medications? Y -  Managing your Finances? Y -  Housekeeping or managing your Housekeeping? Y -  Some recent data might be hidden    Immunizations and Health Maintenance Immunization History  Administered Date(s) Administered  . Influenza-Unspecified 09/19/2014  . PPD Test 12/06/2014   Health Maintenance Due  Topic Date Due  . OPHTHALMOLOGY EXAM  01/11/1946  . DEXA SCAN  01/10/2003  . FOOT EXAM  06/07/2016    Patient Care Team: [name], MD as PCP - General  (Internal Medicine) Medina-Vargas, Senaida Lange, NP as Nurse Practitioner (Internal Medicine)  Indicate any recent Medical Services you may have received from other than [facility] providers in the past year (date may be approximate).     Assessment:   This is a routine wellness examination for [place].   Hearing/Vision screen Vision Screening Comments: Vision exam in April 2018 per pt   Dietary issues and exercise activities discussed: Current Exercise Habits: The patient does not participate in regular exercise at present, Exercise limited by: None identified  Goals    . Maintain Lifestyle          Starting today pt will maintain lifestyle.       Depression Screen PHQ 2/9 Scores 05/19/2017  PHQ - 2 Score 0    Fall Risk Fall Risk  05/19/2017  Falls in the past year? No    Cognitive Function:     6CIT Screen 05/19/2017  What Year? 0 points  What month? 0 points  What time? 0 points  Count back from 20 0 points  Months in reverse 0 points  Repeat phrase 0 points  Total Score 0    Screening Tests Health Maintenance  Topic Date Due  . OPHTHALMOLOGY EXAM  01/11/1946  . DEXA SCAN  01/10/2003  . FOOT EXAM  06/07/2016  . INFLUENZA VACCINE  12/22/2017 (Originally 07/22/2017)  . PNA vac Low Risk Adult (1 of 2 - PCV13) 12/22/2017 (Originally 01/10/2003)  . URINE MICROALBUMIN  12/23/2023 (Originally 01/11/1946)  . TETANUS/TDAP  12/23/2023 (Originally 01[birth year]10/1955)  . HEMOGLOBIN A1C  07/20/2017      Plan:    I have personally reviewed and addressed the Medicare Annual Wellness questionnaire and have noted the following in the patient's chart:  A. Medical and social history B. Use of alcohol, tobacco or illicit drugs  C. Current medications and supplements D. Functional ability and status E.  Nutritional status F.  Physical activity G. Advance directives H. List of other physicians I.  Hospitalizations, surgeries, and ER visits in previous 12 months J.  What [place] to  include hearing, vision, cognitive, depression L. Referrals  and appointments - none  In addition, I have reviewed and discussed with patient certain preventive protocols, quality metrics, and best practice recommendations. A written personalized care plan for preventive services as well as general preventive health recommendations were provided to patient.  See attached scanned questionnaire for additional information.   Signed,   Rich Reining, RN Nurse Health Advisor

## 2017-05-19 NOTE — Patient Instructions (Signed)
[name] James , Thank you for taking time to come for your Medicare Wellness Visit. I appreciate your ongoing commitment to your health goals. Please review the following plan we discussed and let me know if I can assist you in the future.   Screening recommendations/referrals: Colonoscopy excluded, pt over age 79 Mammogram excluded, pt over age 74 Bone Density due Recommended yearly ophthalmology/optometry visit for glaucoma screening and checkup Recommended yearly dental visit for hygiene and checkup  Vaccinations: Influenza vaccine due, ordered Pneumococcal vaccine up to date Tdap vaccine not in records Shingles vaccine not in records  Advanced directives: In Chart  Conditions/risks identified: None  Next appointment: None upcoming   Preventive Care 65 Years and Older, Female Preventive care refers to lifestyle choices and visits with your health care provider that can promote health and wellness. What does preventive care include?  A yearly physical exam. This is also called an annual well check.  Dental exams once or twice a year.  Routine eye exams. Ask your health care provider how often you should have your eyes checked.  Personal lifestyle choices, including:  Daily care of your teeth and gums.  Regular physical activity.  Eating a healthy diet.  Avoiding tobacco and drug use.  Limiting alcohol use.  Practicing safe sex.  Taking low-dose aspirin every day.  Taking vitamin and mineral supplements as recommended by your health care provider. What happens during an annual well check? The services and screenings done by your health care provider during your annual well check will depend on your age, overall health, lifestyle risk factors, and family history of disease. Counseling  Your health care provider may ask you questions about your:  Alcohol use.  Tobacco use.  Drug use.  Emotional well-being.  Home and relationship well-being.  Sexual  activity.  Eating habits.  History of falls.  Memory and ability to understand (cognition).  Work and work Statistician.  Reproductive health. Screening  You may have the following tests or measurements:  Height, weight, and BMI.  Blood pressure.  Lipid and cholesterol levels. These may be checked every 5 years, or more frequently if you are over 58 years old.  Skin check.  Lung cancer screening. You may have this screening every year starting at age 66 if you have a 30-pack-year history of smoking and currently smoke or have quit within the past 15 years.  Fecal occult blood test (FOBT) of the stool. You may have this test every year starting at age 36.  Flexible sigmoidoscopy or colonoscopy. You may have a sigmoidoscopy every 5 years or a colonoscopy every 10 years starting at age 44.  Hepatitis C blood test.  Hepatitis B blood test.  Sexually transmitted disease (STD) testing.  Diabetes screening. This is done by checking your blood sugar (glucose) after you have not eaten for a while (fasting). You may have this done every 1-3 years.  Bone density scan. This is done to screen for osteoporosis. You may have this done starting at age 69.  Mammogram. This may be done every 1-2 years. Talk to your health care provider about how often you should have regular mammograms. Talk with your health care provider about your test results, treatment options, and if necessary, the need for more tests. Vaccines  Your health care provider may recommend certain vaccines, such as:  Influenza vaccine. This is recommended every year.  Tetanus, diphtheria, and acellular pertussis (Tdap, Td) vaccine. You may need a Td booster every 10 years.  Zoster  vaccine. You may need this after age 19.  Pneumococcal 13-valent conjugate (PCV13) vaccine. One dose is recommended after age 38.  Pneumococcal polysaccharide (PPSV23) vaccine. One dose is recommended after age 98. Talk to your health care  provider about which screenings and vaccines you need and how often you need them. This information is not intended to replace advice given to you by your health care provider. Make sure you discuss any questions you have with your health care provider. Document Released: 01/04/2016 Document Revised: 08/27/2016 Document Reviewed: 10/09/2015 Elsevier Interactive Patient Education  2017 [place] Prevention in the Home Falls can cause injuries. They can happen to people of all ages. There are many things you can do to make your home safe and to help prevent falls. What can I do on the outside of my home?  Regularly fix the edges of walkways and driveways and fix any cracks.  Remove anything that might make you trip as you walk through a door, such as a raised step or threshold.  Trim any bushes or trees on the path to your home.  Use bright outdoor lighting.  Clear any walking paths of anything that might make someone trip, such as rocks or tools.  Regularly check to see if handrails are loose or broken. Make sure that both sides of any steps have handrails.  Any raised decks and porches should have guardrails on the edges.  Have any leaves, snow, or ice cleared regularly.  Use sand or salt on walking paths during winter.  Clean up any spills in your garage right away. This includes oil or grease spills. What can I do in the bathroom?  Use night lights.  Install grab bars by the toilet and in the tub and shower. Do not use towel bars as grab bars.  Use non-skid mats or decals in the tub or shower.  If you need to sit down in the shower, use a plastic, non-slip stool.  Keep the floor dry. Clean up any water that spills on the floor as soon as it happens.  Remove soap buildup in the tub or shower regularly.  Attach bath mats securely with double-sided non-slip rug tape.  Do not have throw rugs and other things on the floor that can make you trip. What can I do in  the bedroom?  Use night lights.  Make sure that you have a light by your bed that is easy to reach.  Do not use any sheets or blankets that are too big for your bed. They should not hang down onto the floor.  Have a firm chair that has side arms. You can use this for support while you get dressed.  Do not have throw rugs and other things on the floor that can make you trip. What can I do in the kitchen?  Clean up any spills right away.  Avoid walking on wet floors.  Keep items that you use a lot in easy-to-reach places.  If you need to reach something above you, use a strong step stool that has a grab bar.  Keep electrical cords out of the way.  Do not use floor polish or wax that makes floors slippery. If you must use wax, use non-skid floor wax.  Do not have throw rugs and other things on the floor that can make you trip. What can I do with my stairs?  Do not leave any items on the stairs.  Make sure that there are handrails  on both sides of the stairs and use them. Fix handrails that are broken or loose. Make sure that handrails are as long as the stairways.  Check any carpeting to make sure that it is firmly attached to the stairs. Fix any carpet that is loose or worn.  Avoid having throw rugs at the top or bottom of the stairs. If you do have throw rugs, attach them to the floor with carpet tape.  Make sure that you have a light switch at the top of the stairs and the bottom of the stairs. If you do not have them, ask someone to add them for you. What else can I do to help prevent falls?  Wear shoes that:  Do not have high heels.  Have rubber bottoms.  Are comfortable and fit you well.  Are closed at the toe. Do not wear sandals.  If you use a stepladder:  Make sure that it is fully opened. Do not climb a closed stepladder.  Make sure that both sides of the stepladder are locked into place.  Ask someone to hold it for you, if possible.  Clearly mark and  make sure that you can see:  Any grab bars or handrails.  First and last steps.  Where the edge of each step is.  Use tools that help you move around (mobility aids) if they are needed. These include:  Canes.  Walkers.  Scooters.  Crutches.  Turn on the lights when you go into a dark area. Replace any light bulbs as soon as they burn out.  Set up your furniture so you have a clear path. Avoid moving your furniture around.  If any of your floors are uneven, fix them.  If there are any pets around you, be aware of where they are.  Review your medicines with your doctor. Some medicines can make you feel dizzy. This can increase your chance of falling. Ask your doctor what other things that you can do to help prevent falls. This information is not intended to replace advice given to you by your health care provider. Make sure you discuss any questions you have with your health care provider. Document Released: 10/04/2009 Document Revised: 05/15/2016 Document Reviewed: 01/12/2015 Elsevier Interactive Patient Education  2017 [employer].

## 2017-05-20 ENCOUNTER — Ambulatory Visit (INDEPENDENT_AMBULATORY_CARE_PROVIDER_SITE_OTHER): Payer: Medicare Other | Admitting: Orthopedic Surgery

## 2017-05-20 DIAGNOSIS — E1122 Type 2 diabetes mellitus with diabetic chronic kidney disease: Secondary | ICD-10-CM | POA: Diagnosis not present

## 2017-05-20 DIAGNOSIS — N186 End stage renal disease: Secondary | ICD-10-CM | POA: Diagnosis not present

## 2017-05-20 DIAGNOSIS — N2581 Secondary hyperparathyroidism of renal origin: Secondary | ICD-10-CM | POA: Diagnosis not present

## 2017-05-20 DIAGNOSIS — E611 Iron deficiency: Secondary | ICD-10-CM | POA: Diagnosis not present

## 2017-05-20 DIAGNOSIS — D631 Anemia in chronic kidney disease: Secondary | ICD-10-CM | POA: Diagnosis not present

## 2017-05-21 ENCOUNTER — Ambulatory Visit (INDEPENDENT_AMBULATORY_CARE_PROVIDER_SITE_OTHER): Payer: Medicare Other | Admitting: Orthopedic Surgery

## 2017-05-21 ENCOUNTER — Ambulatory Visit (INDEPENDENT_AMBULATORY_CARE_PROVIDER_SITE_OTHER): Payer: Medicare Other

## 2017-05-21 DIAGNOSIS — M79671 Pain in right foot: Secondary | ICD-10-CM

## 2017-05-21 DIAGNOSIS — E1122 Type 2 diabetes mellitus with diabetic chronic kidney disease: Secondary | ICD-10-CM | POA: Diagnosis not present

## 2017-05-21 DIAGNOSIS — M79672 Pain in left foot: Secondary | ICD-10-CM | POA: Diagnosis not present

## 2017-05-21 DIAGNOSIS — N186 End stage renal disease: Secondary | ICD-10-CM | POA: Diagnosis not present

## 2017-05-21 DIAGNOSIS — F419 Anxiety disorder, unspecified: Secondary | ICD-10-CM | POA: Diagnosis not present

## 2017-05-21 DIAGNOSIS — Z992 Dependence on renal dialysis: Secondary | ICD-10-CM | POA: Diagnosis not present

## 2017-05-21 DIAGNOSIS — G4700 Insomnia, unspecified: Secondary | ICD-10-CM | POA: Diagnosis not present

## 2017-05-21 NOTE — Progress Notes (Signed)
Office Visit Note   Patient: [name] James           Date of Birth: 1936-01-07           MRN: [id number] Visit Date: 05/21/2017              Requested by: [name], MD [address] [place], [place] [address] PCP: [name], MD  No chief complaint on file.     HPI:  patient is a 81 year old woman end-stage renal disease on dialysis diabetic who is total assistance for transfers. Patient states that a aid was trying to transfer her and that she had blunt trauma to her feet during the transfer. Patient complains of ankle pain and swelling since this episode.  Assessment & Plan: Visit Diagnoses:  1. Bilateral foot pain     Plan:  Recommended checking her uric acid. Patient does have a history of gout she is on allopurinol 100 mg a day and recommend the uric acid checked it next dialysis session if her uric acid is elevated she can increase her allopurinol 200 mg twice a day. Do not feel that any treatment is necessary for the foot or ankle at this time if she has a stress fracture the treatment would be unchanged.  Follow-Up Instructions: Return if symptoms worsen or fail to improve.   Ortho Exam  Patient is alert, oriented, no adenopathy, well-dressed, normal affect, normal respiratory effort.  patient ambulates in a wheelchair. Examination of both lower extremities she does have some venous stasis swelling. There is swelling around her ankles and she has pain with range of motion of both ankles and pain to palpation across the ankle joint. Review of the radiographs shows no evidence of an ankle fracture. There is no cellulitis no redness no warmth.  Imaging: Xr Foot Complete Left  Result Date: 05/21/2017 Three-view radiographs of the left foot shows calcification of the blood vessels with similar calcification in the blood vessels as to her bone density. There is no evidence of a fracture in the foot or ankle.  Xr Foot Complete Right  Result Date: 05/21/2017  Three-view radiographs of the right foot shows decreased bone mineral density there is as much calcium in her blood vessels as in her bone. The radiographs shows no evidence of a fracture through the foot or ankle.   Labs: Lab Results  Component Value Date   HGBA1C 5.7 (H) 01/20/2017   HGBA1C 5.5 10/22/2016   HGBA1C 5.1 05/22/2016   ESRSEDRATE 64 (H) 07/17/2006   REPTSTATUS 01/24/2017 FINAL 01/24/2017   REPTSTATUS 01/27/2017 FINAL 01/24/2017   GRAMSTAIN  01/24/2017    ABUNDANT WBC PRESENT, PREDOMINANTLY PMN MODERATE GRAM POSITIVE COCCI IN PAIRS MODERATE GRAM POSITIVE COCCI IN CHAINS FEW GRAM NEGATIVE RODS FEW YEAST    CULT  01/24/2017    MODERATE METHICILLIN RESISTANT STAPHYLOCOCCUS AUREUS   LABORGA METHICILLIN RESISTANT STAPHYLOCOCCUS AUREUS 01/24/2017    Orders:  Orders Placed This Encounter  Procedures  . XR Foot Complete Left  . XR Foot Complete Right   No orders of the defined types were placed in this encounter.    Procedures: No procedures performed  Clinical Data: No additional findings.  ROS:  All other systems negative, except as noted in the HPI. Review of Systems  Objective: Vital Signs: There were no vitals taken for this visit.  Specialty Comments:  No specialty comments available.  PMFS History: Patient Active Problem List   Diagnosis Date Noted  . Chronic respiratory failure  with hypoxia ([place]) 04/29/2017  . Morbid (severe) obesity due to excess calories ([place]) 04/29/2017  . LFT elevation   . Pneumonia of left lung due to methicillin resistant Staphylococcus aureus (MRSA) ([place])   . Advance care planning   . Goals of care, counseling/discussion   . Palliative care by specialist   . Sepsis ([place]) 01/19/2017  . HCAP (healthcare-associated pneumonia) 01/19/2017  . Pressure ulcer 01/19/2017  . Long term current use of amiodarone 10/17/2016  . History of pressure ulcer 10/17/2016  . GAD (generalized anxiety disorder) 10/17/2016  . Osteopenia  determined by x-ray 04/17/2016  . Controlled type 2 diabetes mellitus with diabetic polyneuropathy, with long-term current use of insulin ([place]) 04/17/2016  . Type 2 diabetes mellitus with diabetic neuropathy ([place]) 06/08/2015  . Type 2 diabetes, controlled, with renal manifestation ([place]) 06/08/2015  . PVD (peripheral vascular disease) ([place]) 06/08/2015  . Swelling of limb-Left arm 04/10/2015  . Nausea with vomiting 01/12/2015  . AP (abdominal pain) 01/12/2015  . Normocytic anemia 01/12/2015  . Paroxysmal atrial fibrillation ([place]) 12/22/2014  . End stage renal disease ([place])   . Melena   . ESRD on dialysis ([place]) 12/19/2014  . CHF (congestive heart failure) ([place]) 12/19/2014  . Chest pain 12/19/2014  . Palpitation 12/19/2014  . ESRD (end stage renal disease) ([place]) 12/19/2014  . Anemia, chronic disease 12/13/2014  . Hemodialysis-associated hypotension 12/13/2014  . Acute renal failure syndrome ([place])   . Pulmonary edema 11/10/2014  . Physical deconditioning 10/29/2014  . Iron deficiency anemia   . Acute esophagitis 10/24/2014  . Gastritis and gastroduodenitis 10/24/2014  . Heme positive stool 10/24/2014  . Acute encephalopathy 10/21/2014  . Morbid obesity ([place]) 10/18/2014  . Edema 10/18/2014  . AKI (acute kidney injury) ([place]) 10/17/2014  . Acute on chronic diastolic CHF (congestive heart failure) ([place]) 10/17/2014  . Essential hypertension 10/17/2014  . CKD (chronic kidney disease), stage III 10/17/2014  . DM type 2, uncontrolled, with renal complications ([place]) 30/16/0109  . Dyslipidemia 10/17/2014  . Anemia 10/17/2014   Past Medical History:  Diagnosis Date  . Anemia, chronic disease   . Anxiety   . Arthritis    knees  . Atrial fibrillation ([place])   . Barrett esophagus   . Benign paroxysmal positional vertigo   . Breast cancer ([place]) 1980s   bil mastectomies, no radiation or chemo  . Cataract   . Chest pain, atypical 12/05/2008   R/Lmv- normal perfusion all regions, noe ECG  changes   . CHF (congestive heart failure) ([place]) 05/13/2011   echo - NA>35%; stage 1 diastolic dysfunction; elevated LV filling pressure, MAC  . CKD (chronic kidney disease), stage III   . Claudication ([place]) 10/30/2005   doppler - normal evaluation, no evidence of aneurysm, diameter reduction, dissection, compression or vascular abnormality)  . Complication of anesthesia   . Dyslipidemia   . Dysrhythmia    Atrial Fibrillation  . Edema   . ESRD (end stage renal disease) ([place])   . Family history of adverse reaction to anesthesia    son also has nausea   . Fever blister 12/2016  . Gastritis and gastroduodenitis   . GERD (gastroesophageal reflux disease)   . Gout   . Hemodialysis-associated hypotension   . HLD (hyperlipidemia)   . Hypertension   . IBS (irritable bowel syndrome)   . Insomnia   . Morbid obesity ([place])   . PAF (paroxysmal atrial fibrillation) ([place])   . Peripheral neuropathy   . PONV (postoperative nausea and vomiting)   .  Renal cyst   . Renal insufficiency 10/30/2005   doppler - abn resistance consistent w/ parenchymal disease  . Renovascular hypertension   . Type 2 diabetes mellitus with diabetic neuropathy, with long-term current use of insulin ([place])   . Vertigo     Family History  Problem Relation Age of Onset  . Cancer Mother   . Diabetes Mother   . Heart disease Mother   . Heart attack Father   . Stroke Father   . Parkinson's disease Brother   . Cancer Sister     Past Surgical History:  Procedure Laterality Date  . ANGIOPLASTY Left 02/12/2015   Procedure: ANGIOPLASTY;  Surgeon: Angelia Mould, MD;  Location: [facility] CATH LAB;  Service: Cardiovascular;  Laterality: Left;  AVF  . AV FISTULA PLACEMENT Left 11/30/2014   Procedure: ARTERIOVENOUS (AV) FISTULA CREATION LEFT ARM;  Surgeon: Angelia Mould, MD;  Location: [facility];  Service: Vascular;  Laterality: Left;  . AV FISTULA PLACEMENT Left 03/20/2015   Procedure:  Inserton of Left Upper Arm Gortex  Graft;  Surgeon: Angelia Mould, MD;  Location: [facility];  Service: Vascular;  Laterality: Left;  . AV FISTULA PLACEMENT Left 04/12/2015   Procedure: INSERTION OF LEFT ARM  ARTERIOVENOUS GORE-TEX GRAFT ;  Surgeon: Angelia Mould, MD;  Location: [facility];  Service: Vascular;  Laterality: Left;  . BACK SURGERY  ~1980  . CHOLECYSTECTOMY    . ESOPHAGOGASTRODUODENOSCOPY N/A 10/24/2014   Procedure: ESOPHAGOGASTRODUODENOSCOPY (EGD);  Surgeon: Jerene Bears, MD;  Location: [facility] ENDOSCOPY;  Service: Endoscopy;  Laterality: N/A;  . EXCHANGE OF A DIALYSIS CATHETER Left 11/30/2014   Procedure: EXCHANGE OF A DIALYSIS CATHETER, LEFT INTERNAL JUGULAR;  Surgeon: Angelia Mould, MD;  Location: [facility];  Service: Vascular;  Laterality: Left;  . EYE SURGERY    . FISTULOGRAM N/A 02/12/2015   Procedure: FISTULOGRAM;  Surgeon: Angelia Mould, MD;  Location: [facility] CATH LAB;  Service: Cardiovascular;  Laterality: N/A;  . HEMORRHOID SURGERY    . LIGATION OF ARTERIOVENOUS  FISTULA Left 03/20/2015   Procedure: LIGATION OF ARTERIOVENOUS  FISTULA;  Surgeon: Angelia Mould, MD;  Location: [facility];  Service: Vascular;  Laterality: Left;  Marland Kitchen MASTECTOMY Bilateral ~1982   bil breast reconstruction with implants  . ORIF TIBIA & FIBULA FRACTURES Left 2007   also had left non displaced malleolar fracture.   Marland Kitchen REMOVAL OF GRAFT Left 04/12/2015   Procedure: REMOVAL OF LEFT ARM ARTERIOVENOUS GORE-TEX GRAFT;  Surgeon: Angelia Mould, MD;  Location: [facility] OR;  Service: Vascular;  Laterality: Left;   Social History   Occupational History  . Not on file.   Social History Main Topics  . Smoking status: Never Smoker  . Smokeless tobacco: Never Used  . Alcohol use No  . Drug use: No  . Sexual activity: Not on file

## 2017-05-22 DIAGNOSIS — E1122 Type 2 diabetes mellitus with diabetic chronic kidney disease: Secondary | ICD-10-CM | POA: Diagnosis not present

## 2017-05-22 DIAGNOSIS — N186 End stage renal disease: Secondary | ICD-10-CM | POA: Diagnosis not present

## 2017-05-22 DIAGNOSIS — E611 Iron deficiency: Secondary | ICD-10-CM | POA: Diagnosis not present

## 2017-05-22 DIAGNOSIS — D631 Anemia in chronic kidney disease: Secondary | ICD-10-CM | POA: Diagnosis not present

## 2017-05-22 DIAGNOSIS — N2581 Secondary hyperparathyroidism of renal origin: Secondary | ICD-10-CM | POA: Diagnosis not present

## 2017-05-25 DIAGNOSIS — N2581 Secondary hyperparathyroidism of renal origin: Secondary | ICD-10-CM | POA: Diagnosis not present

## 2017-05-25 DIAGNOSIS — E1122 Type 2 diabetes mellitus with diabetic chronic kidney disease: Secondary | ICD-10-CM | POA: Diagnosis not present

## 2017-05-25 DIAGNOSIS — D631 Anemia in chronic kidney disease: Secondary | ICD-10-CM | POA: Diagnosis not present

## 2017-05-25 DIAGNOSIS — N186 End stage renal disease: Secondary | ICD-10-CM | POA: Diagnosis not present

## 2017-05-25 DIAGNOSIS — E611 Iron deficiency: Secondary | ICD-10-CM | POA: Diagnosis not present

## 2017-05-26 DIAGNOSIS — E1122 Type 2 diabetes mellitus with diabetic chronic kidney disease: Secondary | ICD-10-CM | POA: Diagnosis not present

## 2017-05-26 DIAGNOSIS — N2581 Secondary hyperparathyroidism of renal origin: Secondary | ICD-10-CM | POA: Diagnosis not present

## 2017-05-26 DIAGNOSIS — D631 Anemia in chronic kidney disease: Secondary | ICD-10-CM | POA: Diagnosis not present

## 2017-05-26 DIAGNOSIS — N186 End stage renal disease: Secondary | ICD-10-CM | POA: Diagnosis not present

## 2017-05-26 DIAGNOSIS — E611 Iron deficiency: Secondary | ICD-10-CM | POA: Diagnosis not present

## 2017-05-27 DIAGNOSIS — H25811 Combined forms of age-related cataract, right eye: Secondary | ICD-10-CM | POA: Diagnosis not present

## 2017-05-27 DIAGNOSIS — H2511 Age-related nuclear cataract, right eye: Secondary | ICD-10-CM | POA: Diagnosis not present

## 2017-05-29 DIAGNOSIS — N2581 Secondary hyperparathyroidism of renal origin: Secondary | ICD-10-CM | POA: Diagnosis not present

## 2017-05-29 DIAGNOSIS — E611 Iron deficiency: Secondary | ICD-10-CM | POA: Diagnosis not present

## 2017-05-29 DIAGNOSIS — E1122 Type 2 diabetes mellitus with diabetic chronic kidney disease: Secondary | ICD-10-CM | POA: Diagnosis not present

## 2017-05-29 DIAGNOSIS — N186 End stage renal disease: Secondary | ICD-10-CM | POA: Diagnosis not present

## 2017-05-29 DIAGNOSIS — D631 Anemia in chronic kidney disease: Secondary | ICD-10-CM | POA: Diagnosis not present

## 2017-06-01 DIAGNOSIS — N186 End stage renal disease: Secondary | ICD-10-CM | POA: Diagnosis not present

## 2017-06-01 DIAGNOSIS — E611 Iron deficiency: Secondary | ICD-10-CM | POA: Diagnosis not present

## 2017-06-01 DIAGNOSIS — N2581 Secondary hyperparathyroidism of renal origin: Secondary | ICD-10-CM | POA: Diagnosis not present

## 2017-06-01 DIAGNOSIS — E1122 Type 2 diabetes mellitus with diabetic chronic kidney disease: Secondary | ICD-10-CM | POA: Diagnosis not present

## 2017-06-01 DIAGNOSIS — D631 Anemia in chronic kidney disease: Secondary | ICD-10-CM | POA: Diagnosis not present

## 2017-06-03 DIAGNOSIS — D631 Anemia in chronic kidney disease: Secondary | ICD-10-CM | POA: Diagnosis not present

## 2017-06-03 DIAGNOSIS — E1122 Type 2 diabetes mellitus with diabetic chronic kidney disease: Secondary | ICD-10-CM | POA: Diagnosis not present

## 2017-06-03 DIAGNOSIS — N186 End stage renal disease: Secondary | ICD-10-CM | POA: Diagnosis not present

## 2017-06-03 DIAGNOSIS — E611 Iron deficiency: Secondary | ICD-10-CM | POA: Diagnosis not present

## 2017-06-03 DIAGNOSIS — N2581 Secondary hyperparathyroidism of renal origin: Secondary | ICD-10-CM | POA: Diagnosis not present

## 2017-06-05 DIAGNOSIS — E611 Iron deficiency: Secondary | ICD-10-CM | POA: Diagnosis not present

## 2017-06-05 DIAGNOSIS — N2581 Secondary hyperparathyroidism of renal origin: Secondary | ICD-10-CM | POA: Diagnosis not present

## 2017-06-05 DIAGNOSIS — D631 Anemia in chronic kidney disease: Secondary | ICD-10-CM | POA: Diagnosis not present

## 2017-06-05 DIAGNOSIS — N186 End stage renal disease: Secondary | ICD-10-CM | POA: Diagnosis not present

## 2017-06-05 DIAGNOSIS — E1122 Type 2 diabetes mellitus with diabetic chronic kidney disease: Secondary | ICD-10-CM | POA: Diagnosis not present

## 2017-06-08 DIAGNOSIS — D631 Anemia in chronic kidney disease: Secondary | ICD-10-CM | POA: Diagnosis not present

## 2017-06-08 DIAGNOSIS — E611 Iron deficiency: Secondary | ICD-10-CM | POA: Diagnosis not present

## 2017-06-08 DIAGNOSIS — N2581 Secondary hyperparathyroidism of renal origin: Secondary | ICD-10-CM | POA: Diagnosis not present

## 2017-06-08 DIAGNOSIS — E1122 Type 2 diabetes mellitus with diabetic chronic kidney disease: Secondary | ICD-10-CM | POA: Diagnosis not present

## 2017-06-08 DIAGNOSIS — N186 End stage renal disease: Secondary | ICD-10-CM | POA: Diagnosis not present

## 2017-06-10 DIAGNOSIS — E1122 Type 2 diabetes mellitus with diabetic chronic kidney disease: Secondary | ICD-10-CM | POA: Diagnosis not present

## 2017-06-10 DIAGNOSIS — D631 Anemia in chronic kidney disease: Secondary | ICD-10-CM | POA: Diagnosis not present

## 2017-06-10 DIAGNOSIS — E611 Iron deficiency: Secondary | ICD-10-CM | POA: Diagnosis not present

## 2017-06-10 DIAGNOSIS — N2581 Secondary hyperparathyroidism of renal origin: Secondary | ICD-10-CM | POA: Diagnosis not present

## 2017-06-10 DIAGNOSIS — N186 End stage renal disease: Secondary | ICD-10-CM | POA: Diagnosis not present

## 2017-06-11 DIAGNOSIS — Z992 Dependence on renal dialysis: Secondary | ICD-10-CM | POA: Diagnosis not present

## 2017-06-11 DIAGNOSIS — T82858A Stenosis of vascular prosthetic devices, implants and grafts, initial encounter: Secondary | ICD-10-CM | POA: Diagnosis not present

## 2017-06-11 DIAGNOSIS — I871 Compression of vein: Secondary | ICD-10-CM | POA: Diagnosis not present

## 2017-06-11 DIAGNOSIS — N186 End stage renal disease: Secondary | ICD-10-CM | POA: Diagnosis not present

## 2017-06-12 DIAGNOSIS — E1122 Type 2 diabetes mellitus with diabetic chronic kidney disease: Secondary | ICD-10-CM | POA: Diagnosis not present

## 2017-06-12 DIAGNOSIS — N186 End stage renal disease: Secondary | ICD-10-CM | POA: Diagnosis not present

## 2017-06-12 DIAGNOSIS — E611 Iron deficiency: Secondary | ICD-10-CM | POA: Diagnosis not present

## 2017-06-12 DIAGNOSIS — N2581 Secondary hyperparathyroidism of renal origin: Secondary | ICD-10-CM | POA: Diagnosis not present

## 2017-06-12 DIAGNOSIS — D631 Anemia in chronic kidney disease: Secondary | ICD-10-CM | POA: Diagnosis not present

## 2017-06-15 DIAGNOSIS — N2581 Secondary hyperparathyroidism of renal origin: Secondary | ICD-10-CM | POA: Diagnosis not present

## 2017-06-15 DIAGNOSIS — D631 Anemia in chronic kidney disease: Secondary | ICD-10-CM | POA: Diagnosis not present

## 2017-06-15 DIAGNOSIS — E611 Iron deficiency: Secondary | ICD-10-CM | POA: Diagnosis not present

## 2017-06-15 DIAGNOSIS — E1122 Type 2 diabetes mellitus with diabetic chronic kidney disease: Secondary | ICD-10-CM | POA: Diagnosis not present

## 2017-06-15 DIAGNOSIS — N186 End stage renal disease: Secondary | ICD-10-CM | POA: Diagnosis not present

## 2017-06-17 DIAGNOSIS — E1122 Type 2 diabetes mellitus with diabetic chronic kidney disease: Secondary | ICD-10-CM | POA: Diagnosis not present

## 2017-06-17 DIAGNOSIS — N186 End stage renal disease: Secondary | ICD-10-CM | POA: Diagnosis not present

## 2017-06-17 DIAGNOSIS — E611 Iron deficiency: Secondary | ICD-10-CM | POA: Diagnosis not present

## 2017-06-17 DIAGNOSIS — N2581 Secondary hyperparathyroidism of renal origin: Secondary | ICD-10-CM | POA: Diagnosis not present

## 2017-06-17 DIAGNOSIS — D631 Anemia in chronic kidney disease: Secondary | ICD-10-CM | POA: Diagnosis not present

## 2017-06-19 DIAGNOSIS — E611 Iron deficiency: Secondary | ICD-10-CM | POA: Diagnosis not present

## 2017-06-19 DIAGNOSIS — E1122 Type 2 diabetes mellitus with diabetic chronic kidney disease: Secondary | ICD-10-CM | POA: Diagnosis not present

## 2017-06-19 DIAGNOSIS — D631 Anemia in chronic kidney disease: Secondary | ICD-10-CM | POA: Diagnosis not present

## 2017-06-19 DIAGNOSIS — N186 End stage renal disease: Secondary | ICD-10-CM | POA: Diagnosis not present

## 2017-06-19 DIAGNOSIS — N2581 Secondary hyperparathyroidism of renal origin: Secondary | ICD-10-CM | POA: Diagnosis not present

## 2017-06-20 DIAGNOSIS — Z992 Dependence on renal dialysis: Secondary | ICD-10-CM | POA: Diagnosis not present

## 2017-06-20 DIAGNOSIS — N186 End stage renal disease: Secondary | ICD-10-CM | POA: Diagnosis not present

## 2017-06-20 DIAGNOSIS — E1122 Type 2 diabetes mellitus with diabetic chronic kidney disease: Secondary | ICD-10-CM | POA: Diagnosis not present

## 2017-06-22 DIAGNOSIS — N2581 Secondary hyperparathyroidism of renal origin: Secondary | ICD-10-CM | POA: Diagnosis not present

## 2017-06-22 DIAGNOSIS — E1122 Type 2 diabetes mellitus with diabetic chronic kidney disease: Secondary | ICD-10-CM | POA: Diagnosis not present

## 2017-06-22 DIAGNOSIS — E611 Iron deficiency: Secondary | ICD-10-CM | POA: Diagnosis not present

## 2017-06-22 DIAGNOSIS — D509 Iron deficiency anemia, unspecified: Secondary | ICD-10-CM | POA: Diagnosis not present

## 2017-06-22 DIAGNOSIS — N186 End stage renal disease: Secondary | ICD-10-CM | POA: Diagnosis not present

## 2017-06-24 DIAGNOSIS — E1122 Type 2 diabetes mellitus with diabetic chronic kidney disease: Secondary | ICD-10-CM | POA: Diagnosis not present

## 2017-06-24 DIAGNOSIS — E611 Iron deficiency: Secondary | ICD-10-CM | POA: Diagnosis not present

## 2017-06-24 DIAGNOSIS — N186 End stage renal disease: Secondary | ICD-10-CM | POA: Diagnosis not present

## 2017-06-24 DIAGNOSIS — N2581 Secondary hyperparathyroidism of renal origin: Secondary | ICD-10-CM | POA: Diagnosis not present

## 2017-06-24 DIAGNOSIS — D509 Iron deficiency anemia, unspecified: Secondary | ICD-10-CM | POA: Diagnosis not present

## 2017-06-25 ENCOUNTER — Ambulatory Visit (INDEPENDENT_AMBULATORY_CARE_PROVIDER_SITE_OTHER): Payer: Medicare Other | Admitting: Internal Medicine

## 2017-06-25 ENCOUNTER — Telehealth: Payer: Self-pay

## 2017-06-25 ENCOUNTER — Encounter: Payer: Self-pay | Admitting: Internal Medicine

## 2017-06-25 VITALS — BP 100/56 | HR 68

## 2017-06-25 DIAGNOSIS — K22719 Barrett's esophagus with dysplasia, unspecified: Secondary | ICD-10-CM | POA: Diagnosis not present

## 2017-06-25 DIAGNOSIS — K219 Gastro-esophageal reflux disease without esophagitis: Secondary | ICD-10-CM

## 2017-06-25 DIAGNOSIS — K30 Functional dyspepsia: Secondary | ICD-10-CM

## 2017-06-25 DIAGNOSIS — R195 Other fecal abnormalities: Secondary | ICD-10-CM | POA: Diagnosis not present

## 2017-06-25 MED ORDER — DICYCLOMINE HCL 10 MG PO CAPS: 10.0000 mg | ORAL_CAPSULE | Freq: Three times a day (TID) | ORAL | 0 refills | Status: DC | PRN

## 2017-06-25 NOTE — Telephone Encounter (Signed)
error 

## 2017-06-25 NOTE — Patient Instructions (Signed)
Resume pantoprazole 40 mg, daily before breakfast.  Take Bentyl 20 mg three times daily as needed for abdominal pain and loose stool.  Continue Imodium 2 mg, three times daily as needed for diarrhea.  Follow a low phosphorous diet.  If you are age 79 or older, your body mass index should be between 23-30. Your There is no height or weight on file to calculate BMI. If this is out of the aforementioned range listed, please consider follow up with your Primary Care Provider.  If you are age 85 or younger, your body mass index should be between 19-25. Your There is no height or weight on file to calculate BMI. If this is out of the aformentioned range listed, please consider follow up with your Primary Care Provider.

## 2017-06-25 NOTE — Progress Notes (Signed)
Subjective:    Patient ID: [name] James, female    DOB: 1936/12/17, 81 y.o.   MRN: [id number]  HPI [name] James is a 81 year old female with a history of GERD, Barrett's esophagus, gastritis, anemia, chronic loose stools, end-stage renal disease on dialysis, COPD on oxygen, remote breast cancer who is here for follow-up. She is here today with her son and was last seen on 11/25/2016. She was hospitalized with a life-threatening healthcare associated pneumonia in January. She has recovered she feels fairly well.  She reports that her biggest issue is nausea related to her phosphorus binders prescribed by nephrology. She is having some issues with indigestion and heartburn as well as hiccups. She could not tolerate PhosLo and Tums have been recommended with meals as a phosphate binder. She reports the medication prescribed by me after last visit which was pantoprazole was very helpful but she has no longer taking this. She is unclear why. She denies dysphagia and odynophagia.  She continues to have issues with intermittent loose stools which also seem to be worse when she has dialysis. She was using Bentyl previously but this is now also off of her medication list. She found it helpful. She is using Imodium 2 mg on an as-needed basis which seems to help. She denies blood in her stool or melena.  She received IV iron and reports that this has helped. On review of her records her hemoglobin has improved significantly and was 11.3 on 03/27/2017. Prior to this it had been in the 7-8 range.  She has continued dialysis on Monday, Wednesdays and Fridays having completed her full session yesterday. She does intermittently develop hypotension associated with her dialysis sessions.  Review of Systems As per history of present illness, otherwise negative  Current Medications, Allergies, Past Medical History, Past Surgical History, Family History and Social History were reviewed in [employer] record.     Objective:   Physical Exam BP (!) 100/56 (BP Location: Right Arm, Patient Position: Sitting, Cuff Size: Normal)   Pulse 68  Constitutional: Well-developed and well-nourished. No distress. HEENT: Chronically ill-appearing elderly female sitting in wheelchair in no acute distress .  No scleral icterus. Stye on left lower eyelid Neck: Neck supple. Trachea midline. Cardiovascular: Normal rate, regular rhythm and intact distal pulses.  Pulmonary/chest: Effort normal and breath sounds normal.  Abdominal: Soft, nontender, nondistended. Bowel sounds active throughout.  Extremities: no clubbing, cyanosis, or edema Neurological: Alert and oriented to person place and time. Skin: Skin is warm and dry. Psychiatric: Normal mood and affect. Behavior is normal.  CBC    Component Value Date/Time   WBC 7.1 03/27/2017   WBC 8.6 02/02/2017 1456   RBC 2.57 (L) 02/02/2017 1456   HGB 11.3 (A) 03/27/2017   HGB 13.0 09/06/2008 1552   HCT 36 03/27/2017   HCT 38.3 09/06/2008 1552   PLT 148 (A) 03/27/2017   PLT 188 09/06/2008 1552   MCV 97.7 02/02/2017 1456   MCV 95.6 04/02/2014 0908   MCV 95.0 09/06/2008 1552   MCH 30.7 02/02/2017 1456   MCHC 31.5 02/02/2017 1456   RDW 22.5 (H) 02/02/2017 1456   RDW 15.3 (H) 09/06/2008 1552   LYMPHSABS 3.8 01/19/2017 1725   LYMPHSABS 4.2 (H) 09/06/2008 1552   MONOABS 0.5 01/19/2017 1725   MONOABS 0.7 09/06/2008 1552   EOSABS 0.0 01/19/2017 1725   EOSABS 0.2 09/06/2008 1552   BASOSABS 0.0 01/19/2017 1725   BASOSABS 0.0 09/06/2008 1552  Assessment & Plan:  81 year old female with a history of GERD, Barrett's esophagus, gastritis, anemia, chronic loose stools, end-stage renal disease on dialysis, COPD on oxygen, remote breast cancer who is here for follow-up.  1. GERD/indigestion -- history of Barrett's esophagus and established reflux disease. For some reason she is off PPI therapy. I do recommend PPI therapy on a chronic and  ongoing basis. Her medications are also likely worsening her nausea. Will resume pantoprazole 40 mg daily, 30 minutes before breakfast. Continue GERD/antireflux diet and precautions. Surveillance endoscopy not being performed for Barrett's given chronic medical comorbidities and relative high risk of the procedure relative to benefit.  2. Chronic and intermittent loose stools -- resume Bentyl 20 mg 3 times a day when necessary. Continue Imodium 2 mg 3 times a day when necessary.  3. History of anemia -- multifactorial. Improved to 11.3 as of 3 months ago.  no evidence of ongoing overt blood loss. followed closely by nephrology.  No roller indication for endoscopy currently. She is high risk for endoscopic procedures and wishes to avoid them if at all possible. Her son is in agreement. Follow-up as needed 25 minutes spent with the patient today. Greater than 50% was spent in counseling and coordination of care with the patient

## 2017-06-26 DIAGNOSIS — N2581 Secondary hyperparathyroidism of renal origin: Secondary | ICD-10-CM | POA: Diagnosis not present

## 2017-06-26 DIAGNOSIS — N186 End stage renal disease: Secondary | ICD-10-CM | POA: Diagnosis not present

## 2017-06-26 DIAGNOSIS — E611 Iron deficiency: Secondary | ICD-10-CM | POA: Diagnosis not present

## 2017-06-26 DIAGNOSIS — E1122 Type 2 diabetes mellitus with diabetic chronic kidney disease: Secondary | ICD-10-CM | POA: Diagnosis not present

## 2017-06-26 DIAGNOSIS — D509 Iron deficiency anemia, unspecified: Secondary | ICD-10-CM | POA: Diagnosis not present

## 2017-06-29 DIAGNOSIS — E611 Iron deficiency: Secondary | ICD-10-CM | POA: Diagnosis not present

## 2017-06-29 DIAGNOSIS — D509 Iron deficiency anemia, unspecified: Secondary | ICD-10-CM | POA: Diagnosis not present

## 2017-06-29 DIAGNOSIS — N2581 Secondary hyperparathyroidism of renal origin: Secondary | ICD-10-CM | POA: Diagnosis not present

## 2017-06-29 DIAGNOSIS — E1122 Type 2 diabetes mellitus with diabetic chronic kidney disease: Secondary | ICD-10-CM | POA: Diagnosis not present

## 2017-06-29 DIAGNOSIS — N186 End stage renal disease: Secondary | ICD-10-CM | POA: Diagnosis not present

## 2017-07-01 DIAGNOSIS — E611 Iron deficiency: Secondary | ICD-10-CM | POA: Diagnosis not present

## 2017-07-01 DIAGNOSIS — N186 End stage renal disease: Secondary | ICD-10-CM | POA: Diagnosis not present

## 2017-07-01 DIAGNOSIS — D509 Iron deficiency anemia, unspecified: Secondary | ICD-10-CM | POA: Diagnosis not present

## 2017-07-01 DIAGNOSIS — N2581 Secondary hyperparathyroidism of renal origin: Secondary | ICD-10-CM | POA: Diagnosis not present

## 2017-07-01 DIAGNOSIS — E1122 Type 2 diabetes mellitus with diabetic chronic kidney disease: Secondary | ICD-10-CM | POA: Diagnosis not present

## 2017-07-02 DIAGNOSIS — N186 End stage renal disease: Secondary | ICD-10-CM | POA: Diagnosis not present

## 2017-07-02 DIAGNOSIS — E119 Type 2 diabetes mellitus without complications: Secondary | ICD-10-CM | POA: Diagnosis not present

## 2017-07-02 DIAGNOSIS — Z992 Dependence on renal dialysis: Secondary | ICD-10-CM | POA: Diagnosis not present

## 2017-07-02 DIAGNOSIS — J029 Acute pharyngitis, unspecified: Secondary | ICD-10-CM | POA: Diagnosis not present

## 2017-07-03 DIAGNOSIS — N186 End stage renal disease: Secondary | ICD-10-CM | POA: Diagnosis not present

## 2017-07-03 DIAGNOSIS — E611 Iron deficiency: Secondary | ICD-10-CM | POA: Diagnosis not present

## 2017-07-03 DIAGNOSIS — E1122 Type 2 diabetes mellitus with diabetic chronic kidney disease: Secondary | ICD-10-CM | POA: Diagnosis not present

## 2017-07-03 DIAGNOSIS — N2581 Secondary hyperparathyroidism of renal origin: Secondary | ICD-10-CM | POA: Diagnosis not present

## 2017-07-03 DIAGNOSIS — D509 Iron deficiency anemia, unspecified: Secondary | ICD-10-CM | POA: Diagnosis not present

## 2017-07-06 DIAGNOSIS — E1122 Type 2 diabetes mellitus with diabetic chronic kidney disease: Secondary | ICD-10-CM | POA: Diagnosis not present

## 2017-07-06 DIAGNOSIS — E611 Iron deficiency: Secondary | ICD-10-CM | POA: Diagnosis not present

## 2017-07-06 DIAGNOSIS — N186 End stage renal disease: Secondary | ICD-10-CM | POA: Diagnosis not present

## 2017-07-06 DIAGNOSIS — D509 Iron deficiency anemia, unspecified: Secondary | ICD-10-CM | POA: Diagnosis not present

## 2017-07-06 DIAGNOSIS — N2581 Secondary hyperparathyroidism of renal origin: Secondary | ICD-10-CM | POA: Diagnosis not present

## 2017-07-08 DIAGNOSIS — N186 End stage renal disease: Secondary | ICD-10-CM | POA: Diagnosis not present

## 2017-07-08 DIAGNOSIS — E611 Iron deficiency: Secondary | ICD-10-CM | POA: Diagnosis not present

## 2017-07-08 DIAGNOSIS — E1122 Type 2 diabetes mellitus with diabetic chronic kidney disease: Secondary | ICD-10-CM | POA: Diagnosis not present

## 2017-07-08 DIAGNOSIS — D509 Iron deficiency anemia, unspecified: Secondary | ICD-10-CM | POA: Diagnosis not present

## 2017-07-08 DIAGNOSIS — N2581 Secondary hyperparathyroidism of renal origin: Secondary | ICD-10-CM | POA: Diagnosis not present

## 2017-07-09 DIAGNOSIS — J029 Acute pharyngitis, unspecified: Secondary | ICD-10-CM | POA: Diagnosis not present

## 2017-07-09 DIAGNOSIS — H9202 Otalgia, left ear: Secondary | ICD-10-CM | POA: Diagnosis not present

## 2017-07-09 DIAGNOSIS — D170 Benign lipomatous neoplasm of skin and subcutaneous tissue of head, face and neck: Secondary | ICD-10-CM | POA: Diagnosis not present

## 2017-07-10 DIAGNOSIS — E611 Iron deficiency: Secondary | ICD-10-CM | POA: Diagnosis not present

## 2017-07-10 DIAGNOSIS — E1122 Type 2 diabetes mellitus with diabetic chronic kidney disease: Secondary | ICD-10-CM | POA: Diagnosis not present

## 2017-07-10 DIAGNOSIS — N2581 Secondary hyperparathyroidism of renal origin: Secondary | ICD-10-CM | POA: Diagnosis not present

## 2017-07-10 DIAGNOSIS — N186 End stage renal disease: Secondary | ICD-10-CM | POA: Diagnosis not present

## 2017-07-10 DIAGNOSIS — D509 Iron deficiency anemia, unspecified: Secondary | ICD-10-CM | POA: Diagnosis not present

## 2017-07-10 DIAGNOSIS — R221 Localized swelling, mass and lump, neck: Secondary | ICD-10-CM | POA: Diagnosis not present

## 2017-07-13 DIAGNOSIS — E611 Iron deficiency: Secondary | ICD-10-CM | POA: Diagnosis not present

## 2017-07-13 DIAGNOSIS — D509 Iron deficiency anemia, unspecified: Secondary | ICD-10-CM | POA: Diagnosis not present

## 2017-07-13 DIAGNOSIS — E1122 Type 2 diabetes mellitus with diabetic chronic kidney disease: Secondary | ICD-10-CM | POA: Diagnosis not present

## 2017-07-13 DIAGNOSIS — N2581 Secondary hyperparathyroidism of renal origin: Secondary | ICD-10-CM | POA: Diagnosis not present

## 2017-07-13 DIAGNOSIS — N186 End stage renal disease: Secondary | ICD-10-CM | POA: Diagnosis not present

## 2017-07-14 DIAGNOSIS — J3501 Chronic tonsillitis: Secondary | ICD-10-CM | POA: Diagnosis not present

## 2017-07-14 DIAGNOSIS — J410 Simple chronic bronchitis: Secondary | ICD-10-CM | POA: Diagnosis not present

## 2017-07-14 DIAGNOSIS — K1120 Sialoadenitis, unspecified: Secondary | ICD-10-CM | POA: Diagnosis not present

## 2017-07-15 ENCOUNTER — Other Ambulatory Visit (HOSPITAL_COMMUNITY): Payer: Self-pay | Admitting: Otolaryngology

## 2017-07-15 DIAGNOSIS — D509 Iron deficiency anemia, unspecified: Secondary | ICD-10-CM | POA: Diagnosis not present

## 2017-07-15 DIAGNOSIS — E611 Iron deficiency: Secondary | ICD-10-CM | POA: Diagnosis not present

## 2017-07-15 DIAGNOSIS — E1122 Type 2 diabetes mellitus with diabetic chronic kidney disease: Secondary | ICD-10-CM | POA: Diagnosis not present

## 2017-07-15 DIAGNOSIS — K1120 Sialoadenitis, unspecified: Secondary | ICD-10-CM

## 2017-07-15 DIAGNOSIS — N186 End stage renal disease: Secondary | ICD-10-CM | POA: Diagnosis not present

## 2017-07-15 DIAGNOSIS — N2581 Secondary hyperparathyroidism of renal origin: Secondary | ICD-10-CM | POA: Diagnosis not present

## 2017-07-17 DIAGNOSIS — N186 End stage renal disease: Secondary | ICD-10-CM | POA: Diagnosis not present

## 2017-07-17 DIAGNOSIS — E1122 Type 2 diabetes mellitus with diabetic chronic kidney disease: Secondary | ICD-10-CM | POA: Diagnosis not present

## 2017-07-17 DIAGNOSIS — D509 Iron deficiency anemia, unspecified: Secondary | ICD-10-CM | POA: Diagnosis not present

## 2017-07-17 DIAGNOSIS — E611 Iron deficiency: Secondary | ICD-10-CM | POA: Diagnosis not present

## 2017-07-17 DIAGNOSIS — N2581 Secondary hyperparathyroidism of renal origin: Secondary | ICD-10-CM | POA: Diagnosis not present

## 2017-07-20 DIAGNOSIS — N2581 Secondary hyperparathyroidism of renal origin: Secondary | ICD-10-CM | POA: Diagnosis not present

## 2017-07-20 DIAGNOSIS — E611 Iron deficiency: Secondary | ICD-10-CM | POA: Diagnosis not present

## 2017-07-20 DIAGNOSIS — D509 Iron deficiency anemia, unspecified: Secondary | ICD-10-CM | POA: Diagnosis not present

## 2017-07-20 DIAGNOSIS — E1122 Type 2 diabetes mellitus with diabetic chronic kidney disease: Secondary | ICD-10-CM | POA: Diagnosis not present

## 2017-07-20 DIAGNOSIS — N186 End stage renal disease: Secondary | ICD-10-CM | POA: Diagnosis not present

## 2017-07-21 DIAGNOSIS — Z992 Dependence on renal dialysis: Secondary | ICD-10-CM | POA: Diagnosis not present

## 2017-07-21 DIAGNOSIS — N186 End stage renal disease: Secondary | ICD-10-CM | POA: Diagnosis not present

## 2017-07-21 DIAGNOSIS — E1122 Type 2 diabetes mellitus with diabetic chronic kidney disease: Secondary | ICD-10-CM | POA: Diagnosis not present

## 2017-07-22 ENCOUNTER — Encounter (HOSPITAL_COMMUNITY): Payer: Self-pay

## 2017-07-22 ENCOUNTER — Ambulatory Visit (HOSPITAL_COMMUNITY)
Admission: RE | Admit: 2017-07-22 | Discharge: 2017-07-22 | Disposition: A | Discharge status: Home / Self Care | Payer: Medicare Other | Source: Ambulatory Visit | Attending: Otolaryngology | Admitting: Otolaryngology

## 2017-07-22 DIAGNOSIS — J90 Pleural effusion, not elsewhere classified: Secondary | ICD-10-CM | POA: Diagnosis not present

## 2017-07-22 DIAGNOSIS — D631 Anemia in chronic kidney disease: Secondary | ICD-10-CM | POA: Diagnosis not present

## 2017-07-22 DIAGNOSIS — K1120 Sialoadenitis, unspecified: Secondary | ICD-10-CM | POA: Insufficient documentation

## 2017-07-22 DIAGNOSIS — D509 Iron deficiency anemia, unspecified: Secondary | ICD-10-CM | POA: Diagnosis not present

## 2017-07-22 DIAGNOSIS — E1122 Type 2 diabetes mellitus with diabetic chronic kidney disease: Secondary | ICD-10-CM | POA: Diagnosis not present

## 2017-07-22 DIAGNOSIS — M47892 Other spondylosis, cervical region: Secondary | ICD-10-CM | POA: Insufficient documentation

## 2017-07-22 DIAGNOSIS — E611 Iron deficiency: Secondary | ICD-10-CM | POA: Diagnosis not present

## 2017-07-22 DIAGNOSIS — R221 Localized swelling, mass and lump, neck: Secondary | ICD-10-CM | POA: Diagnosis not present

## 2017-07-22 DIAGNOSIS — N390 Urinary tract infection, site not specified: Secondary | ICD-10-CM | POA: Diagnosis not present

## 2017-07-22 DIAGNOSIS — N2581 Secondary hyperparathyroidism of renal origin: Secondary | ICD-10-CM | POA: Diagnosis not present

## 2017-07-22 DIAGNOSIS — N186 End stage renal disease: Secondary | ICD-10-CM | POA: Diagnosis not present

## 2017-07-24 DIAGNOSIS — E611 Iron deficiency: Secondary | ICD-10-CM | POA: Diagnosis not present

## 2017-07-24 DIAGNOSIS — N186 End stage renal disease: Secondary | ICD-10-CM | POA: Diagnosis not present

## 2017-07-24 DIAGNOSIS — N2581 Secondary hyperparathyroidism of renal origin: Secondary | ICD-10-CM | POA: Diagnosis not present

## 2017-07-24 DIAGNOSIS — D509 Iron deficiency anemia, unspecified: Secondary | ICD-10-CM | POA: Diagnosis not present

## 2017-07-24 DIAGNOSIS — D631 Anemia in chronic kidney disease: Secondary | ICD-10-CM | POA: Diagnosis not present

## 2017-07-24 DIAGNOSIS — E1122 Type 2 diabetes mellitus with diabetic chronic kidney disease: Secondary | ICD-10-CM | POA: Diagnosis not present

## 2017-07-27 DIAGNOSIS — E1122 Type 2 diabetes mellitus with diabetic chronic kidney disease: Secondary | ICD-10-CM | POA: Diagnosis not present

## 2017-07-27 DIAGNOSIS — N2581 Secondary hyperparathyroidism of renal origin: Secondary | ICD-10-CM | POA: Diagnosis not present

## 2017-07-27 DIAGNOSIS — D631 Anemia in chronic kidney disease: Secondary | ICD-10-CM | POA: Diagnosis not present

## 2017-07-27 DIAGNOSIS — E611 Iron deficiency: Secondary | ICD-10-CM | POA: Diagnosis not present

## 2017-07-27 DIAGNOSIS — N186 End stage renal disease: Secondary | ICD-10-CM | POA: Diagnosis not present

## 2017-07-27 DIAGNOSIS — D509 Iron deficiency anemia, unspecified: Secondary | ICD-10-CM | POA: Diagnosis not present

## 2017-07-28 DIAGNOSIS — E119 Type 2 diabetes mellitus without complications: Secondary | ICD-10-CM | POA: Diagnosis not present

## 2017-07-28 DIAGNOSIS — R278 Other lack of coordination: Secondary | ICD-10-CM | POA: Diagnosis not present

## 2017-07-29 DIAGNOSIS — N2581 Secondary hyperparathyroidism of renal origin: Secondary | ICD-10-CM | POA: Diagnosis not present

## 2017-07-29 DIAGNOSIS — D509 Iron deficiency anemia, unspecified: Secondary | ICD-10-CM | POA: Diagnosis not present

## 2017-07-29 DIAGNOSIS — E611 Iron deficiency: Secondary | ICD-10-CM | POA: Diagnosis not present

## 2017-07-29 DIAGNOSIS — E1122 Type 2 diabetes mellitus with diabetic chronic kidney disease: Secondary | ICD-10-CM | POA: Diagnosis not present

## 2017-07-29 DIAGNOSIS — D631 Anemia in chronic kidney disease: Secondary | ICD-10-CM | POA: Diagnosis not present

## 2017-07-29 DIAGNOSIS — N186 End stage renal disease: Secondary | ICD-10-CM | POA: Diagnosis not present

## 2017-07-30 ENCOUNTER — Ambulatory Visit (INDEPENDENT_AMBULATORY_CARE_PROVIDER_SITE_OTHER): Payer: Medicare Other | Admitting: Orthopedic Surgery

## 2017-07-30 ENCOUNTER — Encounter (INDEPENDENT_AMBULATORY_CARE_PROVIDER_SITE_OTHER): Payer: Self-pay | Admitting: Orthopedic Surgery

## 2017-07-30 DIAGNOSIS — E1142 Type 2 diabetes mellitus with diabetic polyneuropathy: Secondary | ICD-10-CM | POA: Diagnosis not present

## 2017-07-30 DIAGNOSIS — B351 Tinea unguium: Secondary | ICD-10-CM | POA: Diagnosis not present

## 2017-07-30 DIAGNOSIS — I87323 Chronic venous hypertension (idiopathic) with inflammation of bilateral lower extremity: Secondary | ICD-10-CM | POA: Diagnosis not present

## 2017-07-30 NOTE — Progress Notes (Signed)
Office Visit Note   Patient: [name] James           Date of Birth: December 19, 1936           MRN: [id number] Visit Date: 07/30/2017              Requested by: [name], MD [address] [place], [place] [address] PCP: [name], MD  Chief Complaint  Patient presents with  . Right Foot - Follow-up  . 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 Patient complains of painful onychomycotic nails which she is unable safely trim on her own and complains of venous stasis swelling in both legs.  Assessment & Plan: Visit Diagnoses:  1. Diabetic polyneuropathy associated with type 2 diabetes mellitus ([place])   2. Onychomycosis of toenail   3. Idiopathic chronic venous hypertension of both lower extremities with inflammation     Plan: Recommended knee-high medical compression stockings to be worn daily. The nails were trimmed 10 without complications.  Follow-Up Instructions: Return in about 3 months (around 10/30/2017).   Ortho Exam  Patient is alert, oriented, no adenopathy, well-dressed, normal affect, normal respiratory effort. Examination patient endplates in a wheelchair she has a [employer] fast streamer pad beneath her. She has venous stasis swelling in both lower extremities with brawny skin color changes but no ulcers no cellulitis. There are no plantar ulcers. Patient's feet have capillary refill less than 3 seconds there is no ischemic ulcers she does have thickened discolored onychomycotic nails 10. Patient is unable safely trim the nails on her own due to her diabetic insensate neuropathy and nails were trimmed 10 without complications. Patient has pitting edema in both legs.  Imaging: No results found.  Labs: Lab Results  Component Value Date   HGBA1C 5.7 (H) 01/20/2017   HGBA1C 5.5 10/22/2016   HGBA1C 5.1 05/22/2016   ESRSEDRATE 64 (H) 07/17/2006   REPTSTATUS  01/24/2017 FINAL 01/24/2017   REPTSTATUS 01/27/2017 FINAL 01/24/2017   GRAMSTAIN  01/24/2017    ABUNDANT WBC PRESENT, PREDOMINANTLY PMN MODERATE GRAM POSITIVE COCCI IN PAIRS MODERATE GRAM POSITIVE COCCI IN CHAINS FEW GRAM NEGATIVE RODS FEW YEAST    CULT  01/24/2017    MODERATE METHICILLIN RESISTANT STAPHYLOCOCCUS AUREUS   LABORGA METHICILLIN RESISTANT STAPHYLOCOCCUS AUREUS 01/24/2017    Orders:  No orders of the defined types were placed in this encounter.  No orders of the defined types were placed in this encounter.    Procedures: No procedures performed  Clinical Data: No additional findings.  ROS:  All other systems negative, except as noted in the HPI. Review of Systems  Objective: Vital Signs: There were no vitals taken for this visit.  Specialty Comments:  No specialty comments available.  PMFS History: Patient Active Problem List   Diagnosis Date Noted  . Diabetic polyneuropathy associated with type 2 diabetes mellitus ([place]) 07/30/2017  . Onychomycosis of toenail 07/30/2017  . Idiopathic chronic venous hypertension of both lower extremities with inflammation 07/30/2017  . Chronic respiratory failure with hypoxia ([place]) 04/29/2017  . Morbid (severe) obesity due to excess calories ([place]) 04/29/2017  . LFT elevation   . Pneumonia of left lung due to methicillin resistant Staphylococcus aureus (MRSA) ([place])   . Advance care planning   . Goals of care, counseling/discussion   . Palliative care by specialist   . Sepsis ([place]) 01/19/2017  . HCAP (healthcare-associated  pneumonia) 01/19/2017  . Pressure ulcer 01/19/2017  . Long term current use of amiodarone 10/17/2016  . History of pressure ulcer 10/17/2016  . GAD (generalized anxiety disorder) 10/17/2016  . Osteopenia determined by x-ray 04/17/2016  . Controlled type 2 diabetes mellitus with diabetic polyneuropathy, with long-term current use of insulin ([place]) 04/17/2016  . Type 2 diabetes mellitus with diabetic  neuropathy ([place]) 06/08/2015  . Type 2 diabetes, controlled, with renal manifestation ([place]) 06/08/2015  . PVD (peripheral vascular disease) ([place]) 06/08/2015  . Swelling of limb-Left arm 04/10/2015  . Nausea with vomiting 01/12/2015  . AP (abdominal pain) 01/12/2015  . Normocytic anemia 01/12/2015  . Paroxysmal atrial fibrillation ([place]) 12/22/2014  . End stage renal disease ([place])   . Melena   . ESRD on dialysis ([place]) 12/19/2014  . CHF (congestive heart failure) ([place]) 12/19/2014  . Chest pain 12/19/2014  . Palpitation 12/19/2014  . ESRD (end stage renal disease) ([place]) 12/19/2014  . Anemia, chronic disease 12/13/2014  . Hemodialysis-associated hypotension 12/13/2014  . Acute renal failure syndrome ([place])   . Pulmonary edema 11/10/2014  . Physical deconditioning 10/29/2014  . Iron deficiency anemia   . Acute esophagitis 10/24/2014  . Gastritis and gastroduodenitis 10/24/2014  . Heme positive stool 10/24/2014  . Acute encephalopathy 10/21/2014  . Morbid obesity ([place]) 10/18/2014  . Edema 10/18/2014  . AKI (acute kidney injury) ([place]) 10/17/2014  . Acute on chronic diastolic CHF (congestive heart failure) ([place]) 10/17/2014  . Essential hypertension 10/17/2014  . CKD (chronic kidney disease), stage III 10/17/2014  . DM type 2, uncontrolled, with renal complications ([place]) 38/46/6599  . Dyslipidemia 10/17/2014  . Anemia 10/17/2014   Past Medical History:  Diagnosis Date  . Anemia, chronic disease   . Anxiety   . Arthritis    knees  . Atrial fibrillation ([place])   . Barrett esophagus   . Benign paroxysmal positional vertigo   . Breast cancer ([place]) 1980s   bil mastectomies, no radiation or chemo  . Cataract   . Chest pain, atypical 12/05/2008   R/Lmv- normal perfusion all regions, noe ECG changes   . CHF (congestive heart failure) ([place]) 05/13/2011   echo - JT>70%; stage 1 diastolic dysfunction; elevated LV filling pressure, MAC  . CKD (chronic kidney disease), stage III   .  Claudication ([place]) 10/30/2005   doppler - normal evaluation, no evidence of aneurysm, diameter reduction, dissection, compression or vascular abnormality)  . Complication of anesthesia   . Dyslipidemia   . Dysrhythmia    Atrial Fibrillation  . Edema   . ESRD (end stage renal disease) ([place])   . Family history of adverse reaction to anesthesia    son also has nausea   . Fever blister 12/2016  . Gastritis and gastroduodenitis   . GERD (gastroesophageal reflux disease)   . Gout   . Hemodialysis-associated hypotension   . HLD (hyperlipidemia)   . Hypertension   . IBS (irritable bowel syndrome)   . Insomnia   . Morbid obesity ([place])   . PAF (paroxysmal atrial fibrillation) ([place])   . Peripheral neuropathy   . PONV (postoperative nausea and vomiting)   . Renal cyst   . Renal insufficiency 10/30/2005   doppler - abn resistance consistent w/ parenchymal disease  . Renovascular hypertension   . Type 2 diabetes mellitus with diabetic neuropathy, with long-term current use of insulin ([place])   . Vertigo     Family History  Problem Relation Age of Onset  . Cancer Mother   .  Diabetes Mother   . Heart disease Mother   . Heart attack Father   . Stroke Father   . Parkinson's disease Brother   . Cancer Sister     Past Surgical History:  Procedure Laterality Date  . ANGIOPLASTY Left 02/12/2015   Procedure: ANGIOPLASTY;  Surgeon: Angelia Mould, MD;  Location: [facility] CATH LAB;  Service: Cardiovascular;  Laterality: Left;  AVF  . AV FISTULA PLACEMENT Left 11/30/2014   Procedure: ARTERIOVENOUS (AV) FISTULA CREATION LEFT ARM;  Surgeon: Angelia Mould, MD;  Location: [place];  Service: Vascular;  Laterality: Left;  . AV FISTULA PLACEMENT Left 03/20/2015   Procedure:  Inserton of Left Upper Arm Gortex Graft;  Surgeon: Angelia Mould, MD;  Location: [place];  Service: Vascular;  Laterality: Left;  . AV FISTULA PLACEMENT Left 04/12/2015   Procedure: INSERTION OF LEFT ARM  ARTERIOVENOUS  GORE-TEX GRAFT ;  Surgeon: Angelia Mould, MD;  Location: [place];  Service: Vascular;  Laterality: Left;  . BACK SURGERY  ~1980  . CATARACT EXTRACTION Right   . CHOLECYSTECTOMY    . ESOPHAGOGASTRODUODENOSCOPY N/A 10/24/2014   Procedure: ESOPHAGOGASTRODUODENOSCOPY (EGD);  Surgeon: Jerene Bears, MD;  Location: [facility] ENDOSCOPY;  Service: Endoscopy;  Laterality: N/A;  . EXCHANGE OF A DIALYSIS CATHETER Left 11/30/2014   Procedure: EXCHANGE OF A DIALYSIS CATHETER, LEFT INTERNAL JUGULAR;  Surgeon: Angelia Mould, MD;  Location: [place] [facility];  Service: Vascular;  Laterality: Left;  . EYE SURGERY Left    transplant  . FISTULOGRAM N/A 02/12/2015   Procedure: FISTULOGRAM;  Surgeon: Angelia Mould, MD;  Location: [facility] CATH LAB;  Service: Cardiovascular;  Laterality: N/A;  . HEMORRHOID SURGERY    . LIGATION OF ARTERIOVENOUS  FISTULA Left 03/20/2015   Procedure: LIGATION OF ARTERIOVENOUS  FISTULA;  Surgeon: Angelia Mould, MD;  Location: [place];  Service: Vascular;  Laterality: Left;  Marland Kitchen MASTECTOMY Bilateral ~1982   bil breast reconstruction with implants  . ORIF TIBIA & FIBULA FRACTURES Left 2007   also had left non displaced malleolar fracture.   Marland Kitchen REMOVAL OF GRAFT Left 04/12/2015   Procedure: REMOVAL OF LEFT ARM ARTERIOVENOUS GORE-TEX GRAFT;  Surgeon: Angelia Mould, MD;  Location: [facility] OR;  Service: Vascular;  Laterality: Left;   Social History   Occupational History  . Not on file.   Social History Main Topics  . Smoking status: Never Smoker  . Smokeless tobacco: Never Used  . Alcohol use No  . Drug use: No  . Sexual activity: Not on file

## 2017-07-31 DIAGNOSIS — E1122 Type 2 diabetes mellitus with diabetic chronic kidney disease: Secondary | ICD-10-CM | POA: Diagnosis not present

## 2017-07-31 DIAGNOSIS — D631 Anemia in chronic kidney disease: Secondary | ICD-10-CM | POA: Diagnosis not present

## 2017-07-31 DIAGNOSIS — N186 End stage renal disease: Secondary | ICD-10-CM | POA: Diagnosis not present

## 2017-07-31 DIAGNOSIS — K115 Sialolithiasis: Secondary | ICD-10-CM | POA: Diagnosis not present

## 2017-07-31 DIAGNOSIS — N2581 Secondary hyperparathyroidism of renal origin: Secondary | ICD-10-CM | POA: Diagnosis not present

## 2017-07-31 DIAGNOSIS — D509 Iron deficiency anemia, unspecified: Secondary | ICD-10-CM | POA: Diagnosis not present

## 2017-07-31 DIAGNOSIS — J90 Pleural effusion, not elsewhere classified: Secondary | ICD-10-CM | POA: Diagnosis not present

## 2017-07-31 DIAGNOSIS — E611 Iron deficiency: Secondary | ICD-10-CM | POA: Diagnosis not present

## 2017-08-03 DIAGNOSIS — D631 Anemia in chronic kidney disease: Secondary | ICD-10-CM | POA: Diagnosis not present

## 2017-08-03 DIAGNOSIS — N186 End stage renal disease: Secondary | ICD-10-CM | POA: Diagnosis not present

## 2017-08-03 DIAGNOSIS — E1122 Type 2 diabetes mellitus with diabetic chronic kidney disease: Secondary | ICD-10-CM | POA: Diagnosis not present

## 2017-08-03 DIAGNOSIS — N2581 Secondary hyperparathyroidism of renal origin: Secondary | ICD-10-CM | POA: Diagnosis not present

## 2017-08-03 DIAGNOSIS — D509 Iron deficiency anemia, unspecified: Secondary | ICD-10-CM | POA: Diagnosis not present

## 2017-08-03 DIAGNOSIS — E611 Iron deficiency: Secondary | ICD-10-CM | POA: Diagnosis not present

## 2017-08-05 DIAGNOSIS — D509 Iron deficiency anemia, unspecified: Secondary | ICD-10-CM | POA: Diagnosis not present

## 2017-08-05 DIAGNOSIS — D631 Anemia in chronic kidney disease: Secondary | ICD-10-CM | POA: Diagnosis not present

## 2017-08-05 DIAGNOSIS — E1122 Type 2 diabetes mellitus with diabetic chronic kidney disease: Secondary | ICD-10-CM | POA: Diagnosis not present

## 2017-08-05 DIAGNOSIS — E611 Iron deficiency: Secondary | ICD-10-CM | POA: Diagnosis not present

## 2017-08-05 DIAGNOSIS — N390 Urinary tract infection, site not specified: Secondary | ICD-10-CM | POA: Diagnosis not present

## 2017-08-05 DIAGNOSIS — N186 End stage renal disease: Secondary | ICD-10-CM | POA: Diagnosis not present

## 2017-08-05 DIAGNOSIS — N2581 Secondary hyperparathyroidism of renal origin: Secondary | ICD-10-CM | POA: Diagnosis not present

## 2017-08-06 DIAGNOSIS — K1120 Sialoadenitis, unspecified: Secondary | ICD-10-CM | POA: Diagnosis not present

## 2017-08-06 DIAGNOSIS — N390 Urinary tract infection, site not specified: Secondary | ICD-10-CM | POA: Diagnosis not present

## 2017-08-07 DIAGNOSIS — N186 End stage renal disease: Secondary | ICD-10-CM | POA: Diagnosis not present

## 2017-08-07 DIAGNOSIS — I871 Compression of vein: Secondary | ICD-10-CM | POA: Diagnosis not present

## 2017-08-07 DIAGNOSIS — T82868A Thrombosis of vascular prosthetic devices, implants and grafts, initial encounter: Secondary | ICD-10-CM | POA: Diagnosis not present

## 2017-08-07 DIAGNOSIS — Z992 Dependence on renal dialysis: Secondary | ICD-10-CM | POA: Diagnosis not present

## 2017-08-08 DIAGNOSIS — D631 Anemia in chronic kidney disease: Secondary | ICD-10-CM | POA: Diagnosis not present

## 2017-08-08 DIAGNOSIS — D509 Iron deficiency anemia, unspecified: Secondary | ICD-10-CM | POA: Diagnosis not present

## 2017-08-08 DIAGNOSIS — N2581 Secondary hyperparathyroidism of renal origin: Secondary | ICD-10-CM | POA: Diagnosis not present

## 2017-08-08 DIAGNOSIS — N186 End stage renal disease: Secondary | ICD-10-CM | POA: Diagnosis not present

## 2017-08-08 DIAGNOSIS — E611 Iron deficiency: Secondary | ICD-10-CM | POA: Diagnosis not present

## 2017-08-08 DIAGNOSIS — E1122 Type 2 diabetes mellitus with diabetic chronic kidney disease: Secondary | ICD-10-CM | POA: Diagnosis not present

## 2017-08-10 ENCOUNTER — Emergency Department (HOSPITAL_COMMUNITY)
Admission: EM | Admit: 2017-08-10 | Discharge: 2017-08-11 | Disposition: A | Discharge status: Home / Self Care | Payer: Medicare Other | Attending: Emergency Medicine | Admitting: Emergency Medicine

## 2017-08-10 ENCOUNTER — Encounter (HOSPITAL_COMMUNITY): Payer: Self-pay | Admitting: Emergency Medicine

## 2017-08-10 DIAGNOSIS — Z9981 Dependence on supplemental oxygen: Secondary | ICD-10-CM | POA: Insufficient documentation

## 2017-08-10 DIAGNOSIS — N2581 Secondary hyperparathyroidism of renal origin: Secondary | ICD-10-CM | POA: Diagnosis not present

## 2017-08-10 DIAGNOSIS — I5032 Chronic diastolic (congestive) heart failure: Secondary | ICD-10-CM | POA: Diagnosis not present

## 2017-08-10 DIAGNOSIS — Z880 Allergy status to penicillin: Secondary | ICD-10-CM | POA: Insufficient documentation

## 2017-08-10 DIAGNOSIS — E1122 Type 2 diabetes mellitus with diabetic chronic kidney disease: Secondary | ICD-10-CM | POA: Diagnosis not present

## 2017-08-10 DIAGNOSIS — Z7982 Long term (current) use of aspirin: Secondary | ICD-10-CM | POA: Insufficient documentation

## 2017-08-10 DIAGNOSIS — Z885 Allergy status to narcotic agent status: Secondary | ICD-10-CM | POA: Insufficient documentation

## 2017-08-10 DIAGNOSIS — D631 Anemia in chronic kidney disease: Secondary | ICD-10-CM | POA: Diagnosis not present

## 2017-08-10 DIAGNOSIS — N186 End stage renal disease: Secondary | ICD-10-CM | POA: Diagnosis not present

## 2017-08-10 DIAGNOSIS — R531 Weakness: Secondary | ICD-10-CM | POA: Diagnosis not present

## 2017-08-10 DIAGNOSIS — R899 Unspecified abnormal finding in specimens from other organs, systems and tissues: Secondary | ICD-10-CM | POA: Diagnosis not present

## 2017-08-10 DIAGNOSIS — N189 Chronic kidney disease, unspecified: Secondary | ICD-10-CM

## 2017-08-10 DIAGNOSIS — Z79899 Other long term (current) drug therapy: Secondary | ICD-10-CM | POA: Insufficient documentation

## 2017-08-10 DIAGNOSIS — E611 Iron deficiency: Secondary | ICD-10-CM | POA: Diagnosis not present

## 2017-08-10 DIAGNOSIS — E1140 Type 2 diabetes mellitus with diabetic neuropathy, unspecified: Secondary | ICD-10-CM | POA: Diagnosis not present

## 2017-08-10 DIAGNOSIS — E861 Hypovolemia: Secondary | ICD-10-CM | POA: Diagnosis not present

## 2017-08-10 DIAGNOSIS — Z862 Personal history of diseases of the blood and blood-forming organs and certain disorders involving the immune mechanism: Secondary | ICD-10-CM | POA: Insufficient documentation

## 2017-08-10 DIAGNOSIS — I132 Hypertensive heart and chronic kidney disease with heart failure and with stage 5 chronic kidney disease, or end stage renal disease: Secondary | ICD-10-CM | POA: Diagnosis not present

## 2017-08-10 DIAGNOSIS — R404 Transient alteration of awareness: Secondary | ICD-10-CM | POA: Diagnosis not present

## 2017-08-10 DIAGNOSIS — Z794 Long term (current) use of insulin: Secondary | ICD-10-CM | POA: Insufficient documentation

## 2017-08-10 DIAGNOSIS — D509 Iron deficiency anemia, unspecified: Secondary | ICD-10-CM | POA: Diagnosis not present

## 2017-08-10 DIAGNOSIS — Z992 Dependence on renal dialysis: Secondary | ICD-10-CM | POA: Diagnosis not present

## 2017-08-10 DIAGNOSIS — D649 Anemia, unspecified: Secondary | ICD-10-CM | POA: Diagnosis not present

## 2017-08-10 DIAGNOSIS — R799 Abnormal finding of blood chemistry, unspecified: Secondary | ICD-10-CM | POA: Diagnosis not present

## 2017-08-10 LAB — TYPE AND SCREEN
ABO/RH(D): O POS
Antibody Screen: NEGATIVE

## 2017-08-10 LAB — CBC WITH DIFFERENTIAL/PLATELET
Basophils Absolute: 0.1 10*3/uL (ref 0.0–0.1)
Basophils Relative: 1 %
EOS PCT: 1 %
Eosinophils Absolute: 0.1 10*3/uL (ref 0.0–0.7)
HEMATOCRIT: 27.2 % — AB (ref 36.0–46.0)
Hemoglobin: 8.2 g/dL — ABNORMAL LOW (ref 12.0–15.0)
LYMPHS ABS: 2.6 10*3/uL (ref 0.7–4.0)
Lymphocytes Relative: 24 %
MCH: 36.4 pg — AB (ref 26.0–34.0)
MCHC: 30.1 g/dL (ref 30.0–36.0)
MCV: 120.9 fL — AB (ref 78.0–100.0)
Monocytes Absolute: 0.9 10*3/uL (ref 0.1–1.0)
Monocytes Relative: 8 %
NEUTROS ABS: 7.2 10*3/uL (ref 1.7–7.7)
Neutrophils Relative %: 66 %
Platelets: 299 10*3/uL (ref 150–400)
RBC: 2.25 MIL/uL — ABNORMAL LOW (ref 3.87–5.11)
RDW: 25 % — AB (ref 11.5–15.5)
WBC: 10.9 10*3/uL — ABNORMAL HIGH (ref 4.0–10.5)

## 2017-08-10 LAB — BASIC METABOLIC PANEL
ANION GAP: 11 (ref 5–15)
BUN: 7 mg/dL (ref 6–20)
CALCIUM: 8.4 mg/dL — AB (ref 8.9–10.3)
CHLORIDE: 97 mmol/L — AB (ref 101–111)
CO2: 29 mmol/L (ref 22–32)
CREATININE: 1.55 mg/dL — AB (ref 0.44–1.00)
GFR calc Af Amer: 36 mL/min — ABNORMAL LOW (ref >60)
GFR, EST NON AFRICAN AMERICAN: 31 mL/min — AB (ref >60)
Glucose, Bld: 135 mg/dL — ABNORMAL HIGH (ref 65–99)
POTASSIUM: 3.2 mmol/L — AB (ref 3.5–5.1)
Sodium: 137 mmol/L (ref 135–145)

## 2017-08-10 LAB — POC OCCULT BLOOD, ED: Fecal Occult Bld: NEGATIVE

## 2017-08-10 MED ORDER — MIDODRINE HCL 5 MG PO TABS
5.0000 mg | ORAL_TABLET | Freq: Once | ORAL | Status: AC
  Administered 2017-08-10: 5 mg via ORAL
  Filled 2017-08-10: qty 1

## 2017-08-10 NOTE — ED Notes (Signed)
[facility] called twice to see status on transportation for pt to [facility], [facility] states they don't have and estimated time of arrival, pt waiting since 2014 for transportation after dc.

## 2017-08-10 NOTE — ED Notes (Signed)
Dinner ordered 

## 2017-08-10 NOTE — ED Triage Notes (Signed)
Per EMS: pt sent from dialysis for blood transfusion; pt had full dialysis treatment today; pt lives at [facility]; pt denies complaints other than generalized weakness

## 2017-08-10 NOTE — ED Notes (Signed)
Report given on [facility] to [place], [place], pt to be transported back by [employer].

## 2017-08-10 NOTE — ED Provider Notes (Signed)
[facility] DEPT Provider Note   CSN: [id number] Arrival date & time: 08/10/17  1646     History   Chief Complaint Chief Complaint  Patient presents with  . Anemia    HPI [name] James is a 81 y.o. female.  81yo F w/ PMH including ESRD on HD M/W/F, A fib, T2DM, chronic respiratory failure on 2L home O2 who p/w anemia. 3 days ago, the patient's AV fistula was not working so she couldn't get dialyzed. She was seen the next day when they got it working again and she had a normal dialysis session. Today, she had another routine dialysis session but was told that her blood counts from a few days ago were low and she needed to go to ED for blood transfusion. She is not aware of any blood in her stool, no diarrhea, no blood in her urine. She has had some generalized weakness. She has chronic shortness of breath and is always on 2L [place]. She denies any chest pain. She has not required a blood transfusion since the birth of her child many years ago.    The history is provided by the patient.  Anemia     Past Medical History:  Diagnosis Date  . Anemia, chronic disease   . Anxiety   . Arthritis    knees  . Atrial fibrillation ([place])   . Barrett esophagus   . Benign paroxysmal positional vertigo   . Breast cancer ([place]) 1980s   bil mastectomies, no radiation or chemo  . Cataract   . Chest pain, atypical 12/05/2008   R/Lmv- normal perfusion all regions, noe ECG changes   . CHF (congestive heart failure) ([place]) 05/13/2011   echo - QH>47%; stage 1 diastolic dysfunction; elevated LV filling pressure, MAC  . CKD (chronic kidney disease), stage III   . Claudication ([place]) 10/30/2005   doppler - normal evaluation, no evidence of aneurysm, diameter reduction, dissection, compression or vascular abnormality)  . Complication of anesthesia   . Dyslipidemia   . Dysrhythmia    Atrial Fibrillation  . Edema   . ESRD (end stage renal disease) ([place])   . Family history of adverse reaction to  anesthesia    son also has nausea   . Fever blister 12/2016  . Gastritis and gastroduodenitis   . GERD (gastroesophageal reflux disease)   . Gout   . Hemodialysis-associated hypotension   . HLD (hyperlipidemia)   . Hypertension   . IBS (irritable bowel syndrome)   . Insomnia   . Morbid obesity ([place])   . PAF (paroxysmal atrial fibrillation) ([place])   . Peripheral neuropathy   . PONV (postoperative nausea and vomiting)   . Renal cyst   . Renal insufficiency 10/30/2005   doppler - abn resistance consistent w/ parenchymal disease  . Renovascular hypertension   . Type 2 diabetes mellitus with diabetic neuropathy, with long-term current use of insulin ([place])   . Vertigo     Patient Active Problem List   Diagnosis Date Noted  . Diabetic polyneuropathy associated with type 2 diabetes mellitus ([place]) 07/30/2017  . Onychomycosis of toenail 07/30/2017  . Idiopathic chronic venous hypertension of both lower extremities with inflammation 07/30/2017  . Chronic respiratory failure with hypoxia ([place]) 04/29/2017  . Morbid (severe) obesity due to excess calories ([place]) 04/29/2017  . LFT elevation   . Pneumonia of left lung due to methicillin resistant Staphylococcus aureus (MRSA) ([place])   . Advance care planning   . Goals of care, counseling/discussion   .  Palliative care by specialist   . Sepsis ([place]) 01/19/2017  . HCAP (healthcare-associated pneumonia) 01/19/2017  . Pressure ulcer 01/19/2017  . Long term current use of amiodarone 10/17/2016  . History of pressure ulcer 10/17/2016  . GAD (generalized anxiety disorder) 10/17/2016  . Osteopenia determined by x-ray 04/17/2016  . Controlled type 2 diabetes mellitus with diabetic polyneuropathy, with long-term current use of insulin ([place]) 04/17/2016  . Type 2 diabetes mellitus with diabetic neuropathy ([place]) 06/08/2015  . Type 2 diabetes, controlled, with renal manifestation ([place]) 06/08/2015  . PVD (peripheral vascular disease) ([place]) 06/08/2015  .  Swelling of limb-Left arm 04/10/2015  . Nausea with vomiting 01/12/2015  . AP (abdominal pain) 01/12/2015  . Normocytic anemia 01/12/2015  . Paroxysmal atrial fibrillation ([place]) 12/22/2014  . End stage renal disease ([place])   . Melena   . ESRD on dialysis ([place]) 12/19/2014  . CHF (congestive heart failure) ([place]) 12/19/2014  . Chest pain 12/19/2014  . Palpitation 12/19/2014  . ESRD (end stage renal disease) ([place]) 12/19/2014  . Anemia, chronic disease 12/13/2014  . Hemodialysis-associated hypotension 12/13/2014  . Acute renal failure syndrome ([place])   . Pulmonary edema 11/10/2014  . Physical deconditioning 10/29/2014  . Iron deficiency anemia   . Acute esophagitis 10/24/2014  . Gastritis and gastroduodenitis 10/24/2014  . Heme positive stool 10/24/2014  . Acute encephalopathy 10/21/2014  . Morbid obesity ([place]) 10/18/2014  . Edema 10/18/2014  . AKI (acute kidney injury) ([place]) 10/17/2014  . Acute on chronic diastolic CHF (congestive heart failure) ([place]) 10/17/2014  . Essential hypertension 10/17/2014  . CKD (chronic kidney disease), stage III 10/17/2014  . DM type 2, uncontrolled, with renal complications ([place]) 56/31/4970  . Dyslipidemia 10/17/2014  . Anemia 10/17/2014    Past Surgical History:  Procedure Laterality Date  . ANGIOPLASTY Left 02/12/2015   Procedure: ANGIOPLASTY;  Surgeon: Angelia Mould, MD;  Location: [facility] CATH LAB;  Service: Cardiovascular;  Laterality: Left;  AVF  . AV FISTULA PLACEMENT Left 11/30/2014   Procedure: ARTERIOVENOUS (AV) FISTULA CREATION LEFT ARM;  Surgeon: Angelia Mould, MD;  Location: [facility];  Service: Vascular;  Laterality: Left;  . AV FISTULA PLACEMENT Left 03/20/2015   Procedure:  Inserton of Left Upper Arm Gortex Graft;  Surgeon: Angelia Mould, MD;  Location: [facility];  Service: Vascular;  Laterality: Left;  . AV FISTULA PLACEMENT Left 04/12/2015   Procedure: INSERTION OF LEFT ARM  ARTERIOVENOUS GORE-TEX GRAFT ;  Surgeon:  Angelia Mould, MD;  Location: [facility];  Service: Vascular;  Laterality: Left;  . BACK SURGERY  ~1980  . CATARACT EXTRACTION Right   . CHOLECYSTECTOMY    . ESOPHAGOGASTRODUODENOSCOPY N/A 10/24/2014   Procedure: ESOPHAGOGASTRODUODENOSCOPY (EGD);  Surgeon: Jerene Bears, MD;  Location[facility] ENDOSCOPY;  Service: Endoscopy;  Laterality: N/A;  . EXCHANGE OF A DIALYSIS CATHETER Left 11/30/2014   Procedure: EXCHANGE OF A DIALYSIS CATHETER, LEFT INTERNAL JUGULAR;  Surgeon: Angelia Mould, MD;  Location: [facility];  Service: Vascular;  Laterality: Left;  . EYE SURGERY Left    transplant  . FISTULOGRAM N/A 02/12/2015   Procedure: FISTULOGRAM;  Surgeon: Angelia Mould, MD;  Location: [facility] CATH LAB;  Service: Cardiovascular;  Laterality: N/A;  . HEMORRHOID SURGERY    . LIGATION OF ARTERIOVENOUS  FISTULA Left 03/20/2015   Procedure: LIGATION OF ARTERIOVENOUS  FISTULA;  Surgeon: Angelia Mould, MD;  Location: [facility];  Service: Vascular;  Laterality: Left;  Marland Kitchen MASTECTOMY Bilateral ~1982   bil breast reconstruction with  implants  . ORIF TIBIA & FIBULA FRACTURES Left 2007   also had left non displaced malleolar fracture.   Marland Kitchen REMOVAL OF GRAFT Left 04/12/2015   Procedure: REMOVAL OF LEFT ARM ARTERIOVENOUS GORE-TEX GRAFT;  Surgeon: Angelia Mould, MD;  Location: [facility];  Service: Vascular;  Laterality: Left;    OB History    No data available       Home Medications    Prior to Admission medications   Medication Sig Start Date End Date Taking? Authorizing Provider  phenazopyridine (PYRIDIUM) 100 MG tablet Take 100 mg by mouth 2 (two) times daily.   Yes [provider]  acetaminophen (TYLENOL) 325 MG tablet Take 650 mg by mouth every 6 (six) hours as needed.    [provider]  allopurinol (ZYLOPRIM) 100 MG tablet Take 100 mg by mouth daily.     [provider]  amiodarone (PACERONE) 200 MG tablet Take 200 mg by mouth daily.  06/08/15   [name], MD    aspirin EC 325 MG EC tablet Take 1 tablet (325 mg total) by mouth daily. 02/04/17   Florencia Reasons, MD  B Complex-C-Folic Acid (NEPHRO-VITE PO) Take 1 tablet by mouth at bedtime.     [provider]  baclofen (LIORESAL) 10 MG tablet Take 10 mg by mouth 2 (two) times daily.    [provider]  cephALEXin (KEFLEX) 500 MG capsule Take 500 mg by mouth 2 (two) times daily.    [provider]  diazepam (VALIUM) 5 MG tablet Take one tablet by mouth every night at bedtime for rest 02/04/17   Gildardo Cranker, DO  dicyclomine (BENTYL) 10 MG capsule Take 1 capsule (10 mg total) by mouth 3 (three) times daily as needed for spasms. 06/25/17   Pyrtle, Lajuan Lines, MD  dorzolamide-timolol (COSOPT) 22.3-6.8 MG/ML ophthalmic solution Place 1 drop into the left eye 2 (two) times daily. Wait 2-5 minutes when giving two eye meds    [provider]  fluticasone (FLONASE ALLERGY RELIEF) 50 MCG/ACT nasal spray Place 2 sprays into both nostrils daily.    [provider]  folic acid (FOLVITE) 1 MG tablet Take 1 mg by mouth daily.    [provider]  loperamide (IMODIUM A-D) 2 MG tablet Take 2 mg by mouth 3 (three) times daily as needed for diarrhea or loose stools.     [provider]  Melatonin 5 MG TABS Take 5 mg by mouth at bedtime.     [provider]  metoprolol tartrate (LOPRESSOR) 25 MG tablet Take 0.5 tablets (12.5 mg total) by mouth 2 (two) times daily. Hold if sbp less than 100 or heart rate less than 50, hold on dialysis day. 02/03/17   Florencia Reasons, MD  midodrine (PROAMATINE) 10 MG tablet Take 10 mg by mouth every Monday, Wednesday, and Friday. On dialysis days    [provider]  moxifloxacin (VIGAMOX) 0.5 % ophthalmic solution 1 drop 3 (three) times daily.    [provider]  neomycin-polymyxin-dexameth (MAXITROL) 0.1 % OINT Place 1 application into the right eye at bedtime. Stop date 06/10/17    [provider]  OXYGEN Inhale 2 L/min  into the lungs. 24/7    [provider]  pantoprazole (PROTONIX) 40 MG tablet Take 1 tablet (40 mg total) by mouth daily before breakfast. 06/25/17   Pyrtle, Lajuan Lines, MD  prednisoLONE acetate (PRED FORTE) 1 % ophthalmic suspension Place 1 drop into the right eye 4 (four) times  daily.     [provider]  promethazine (PHENERGAN) 25 MG tablet Take 25 mg by mouth every 8 (eight) hours as needed for nausea or vomiting.     [provider]  saccharomyces boulardii (FLORASTOR) 250 MG capsule Take 250 mg by mouth 2 (two) times daily.    [provider]  Vancomycin (VANCOCIN) 750-5 MG/150ML-% SOLN Inject 750 mg into the vein. Every Monday, Wednesday and Friday    [provider]    Family History Family History  Problem Relation Age of Onset  . Cancer Mother   . Diabetes Mother   . Heart disease Mother   . Heart attack Father   . Stroke Father   . Parkinson's disease Brother   . Cancer Sister     Social History Social History  Substance Use Topics  . Smoking status: Never Smoker  . Smokeless tobacco: Never Used  . Alcohol use No     Allergies   Erythromycin; Erythromycin base; Penicillins; Codeine; and Penicillin g   Review of Systems Review of Systems All other systems reviewed and are negative except that which was mentioned in HPI   Physical Exam Updated Vital Signs BP (!) 100/33   Pulse 66   SpO2 99%   Physical Exam  Constitutional: She is oriented to person, place, and time. She appears well-developed. No distress.  Frail, chronically ill appearing elderly woman awake and comfortable  HENT:  Head: Normocephalic and atraumatic.  Moist mucous membranes  Eyes: Pupils are equal, round, and reactive to light. Conjunctivae are normal.  Neck: Neck supple.  Cardiovascular: Normal rate and regular rhythm.   Murmur heard. Pulmonary/Chest: Effort normal and breath sounds normal.  Abdominal: Soft. Bowel sounds are normal. She exhibits  no distension. There is no tenderness.  Musculoskeletal: She exhibits no edema.  Neurological: She is alert and oriented to person, place, and time.  Fluent speech  Skin: Skin is warm and dry.  Psychiatric: She has a normal mood and affect.  Nursing note and vitals reviewed.    ED Treatments / Results  Labs (all labs ordered are listed, but only abnormal results are displayed) Labs Reviewed  CBC WITH DIFFERENTIAL/PLATELET - Abnormal; Notable for the following:       Result Value   WBC 10.9 (*)    RBC 2.25 (*)    Hemoglobin 8.2 (*)    HCT 27.2 (*)    MCV 120.9 (*)    MCH 36.4 (*)    RDW 25.0 (*)    All other components within normal limits  BASIC METABOLIC PANEL - Abnormal; Notable for the following:    Potassium 3.2 (*)    Chloride 97 (*)    Glucose, Bld 135 (*)    Creatinine, Ser 1.55 (*)    Calcium 8.4 (*)    GFR calc non Af Amer 31 (*)    GFR calc Af Amer 36 (*)    All other components within normal limits  POC OCCULT BLOOD, ED  TYPE AND SCREEN    EKG  EKG Interpretation None       Radiology No results found.  Procedures Procedures (including critical care time)  Medications Ordered in ED Medications  midodrine (PROAMATINE) tablet 5 mg (not administered)     Initial Impression / Assessment and Plan / ED Course  I have reviewed the triage vital signs and the nursing notes.  Pertinent labs that were available during my care of the patient were reviewed by me and considered  in my medical decision making (see chart for details).     Pt sent in for anemia found on routine dialysis labs a few days ago. She was comfortable on exam, BP low normal which is c/w pt's history of low BP requiring midodrine. Afebrile, 99% on 2L, no complaints. Brown stool that was hemoccult negative. Her lab work today shows hemoglobin 8.2, hematocrit 27. Review of her chart shows that this is approximately her baseline Hgb from earlier this year. I reviewed papers that she was  sent with, which show hgb 6.9. It is possible this was a lab error, as today's labs are more c/w baseline and she has had no bleeding other than bleeding associated w/ fixing AV fistula a few days ago. She has been comfortable here and has no signs of acute blood loss. She has chronic respiratory failure therefore difficult to assess whether she is more short of breath, but she denies any chest pain. I discussed w/ nephrology, Dr. [name], who reviewed work up and agreed that pt does not require transfusion at this time given risks of transfusion vs benefits. She recommended routine f/u. Pt will go to dialysis 2 days from now where she can have repeat blood counts. I have discussed findings and follow-up plan with the patient and her grandson. They have voiced understanding and patient discharged in satisfactory condition.  Final Clinical Impressions(s) / ED Diagnoses   Final diagnoses:  None    New Prescriptions New Prescriptions   No medications on file     Sham Alviar, Wenda Overland, MD 08/10/17 1950

## 2017-08-12 DIAGNOSIS — E611 Iron deficiency: Secondary | ICD-10-CM | POA: Diagnosis not present

## 2017-08-12 DIAGNOSIS — D509 Iron deficiency anemia, unspecified: Secondary | ICD-10-CM | POA: Diagnosis not present

## 2017-08-12 DIAGNOSIS — D631 Anemia in chronic kidney disease: Secondary | ICD-10-CM | POA: Diagnosis not present

## 2017-08-12 DIAGNOSIS — N2581 Secondary hyperparathyroidism of renal origin: Secondary | ICD-10-CM | POA: Diagnosis not present

## 2017-08-12 DIAGNOSIS — N186 End stage renal disease: Secondary | ICD-10-CM | POA: Diagnosis not present

## 2017-08-12 DIAGNOSIS — E1122 Type 2 diabetes mellitus with diabetic chronic kidney disease: Secondary | ICD-10-CM | POA: Diagnosis not present

## 2017-08-14 DIAGNOSIS — E1122 Type 2 diabetes mellitus with diabetic chronic kidney disease: Secondary | ICD-10-CM | POA: Diagnosis not present

## 2017-08-14 DIAGNOSIS — D509 Iron deficiency anemia, unspecified: Secondary | ICD-10-CM | POA: Diagnosis not present

## 2017-08-14 DIAGNOSIS — N186 End stage renal disease: Secondary | ICD-10-CM | POA: Diagnosis not present

## 2017-08-14 DIAGNOSIS — N2581 Secondary hyperparathyroidism of renal origin: Secondary | ICD-10-CM | POA: Diagnosis not present

## 2017-08-14 DIAGNOSIS — E611 Iron deficiency: Secondary | ICD-10-CM | POA: Diagnosis not present

## 2017-08-14 DIAGNOSIS — D631 Anemia in chronic kidney disease: Secondary | ICD-10-CM | POA: Diagnosis not present

## 2017-08-17 DIAGNOSIS — N2581 Secondary hyperparathyroidism of renal origin: Secondary | ICD-10-CM | POA: Diagnosis not present

## 2017-08-17 DIAGNOSIS — E1122 Type 2 diabetes mellitus with diabetic chronic kidney disease: Secondary | ICD-10-CM | POA: Diagnosis not present

## 2017-08-17 DIAGNOSIS — N186 End stage renal disease: Secondary | ICD-10-CM | POA: Diagnosis not present

## 2017-08-17 DIAGNOSIS — D631 Anemia in chronic kidney disease: Secondary | ICD-10-CM | POA: Diagnosis not present

## 2017-08-17 DIAGNOSIS — D509 Iron deficiency anemia, unspecified: Secondary | ICD-10-CM | POA: Diagnosis not present

## 2017-08-17 DIAGNOSIS — E611 Iron deficiency: Secondary | ICD-10-CM | POA: Diagnosis not present

## 2017-08-17 DIAGNOSIS — D649 Anemia, unspecified: Secondary | ICD-10-CM | POA: Diagnosis not present

## 2017-08-18 DIAGNOSIS — N390 Urinary tract infection, site not specified: Secondary | ICD-10-CM | POA: Diagnosis not present

## 2017-08-19 DIAGNOSIS — E611 Iron deficiency: Secondary | ICD-10-CM | POA: Diagnosis not present

## 2017-08-19 DIAGNOSIS — N186 End stage renal disease: Secondary | ICD-10-CM | POA: Diagnosis not present

## 2017-08-19 DIAGNOSIS — N2581 Secondary hyperparathyroidism of renal origin: Secondary | ICD-10-CM | POA: Diagnosis not present

## 2017-08-19 DIAGNOSIS — D631 Anemia in chronic kidney disease: Secondary | ICD-10-CM | POA: Diagnosis not present

## 2017-08-19 DIAGNOSIS — E1122 Type 2 diabetes mellitus with diabetic chronic kidney disease: Secondary | ICD-10-CM | POA: Diagnosis not present

## 2017-08-19 DIAGNOSIS — D509 Iron deficiency anemia, unspecified: Secondary | ICD-10-CM | POA: Diagnosis not present

## 2017-08-21 DIAGNOSIS — N186 End stage renal disease: Secondary | ICD-10-CM | POA: Diagnosis not present

## 2017-08-21 DIAGNOSIS — D509 Iron deficiency anemia, unspecified: Secondary | ICD-10-CM | POA: Diagnosis not present

## 2017-08-21 DIAGNOSIS — N2581 Secondary hyperparathyroidism of renal origin: Secondary | ICD-10-CM | POA: Diagnosis not present

## 2017-08-21 DIAGNOSIS — E611 Iron deficiency: Secondary | ICD-10-CM | POA: Diagnosis not present

## 2017-08-21 DIAGNOSIS — Z992 Dependence on renal dialysis: Secondary | ICD-10-CM | POA: Diagnosis not present

## 2017-08-21 DIAGNOSIS — D631 Anemia in chronic kidney disease: Secondary | ICD-10-CM | POA: Diagnosis not present

## 2017-08-21 DIAGNOSIS — E1122 Type 2 diabetes mellitus with diabetic chronic kidney disease: Secondary | ICD-10-CM | POA: Diagnosis not present

## 2017-08-24 DIAGNOSIS — N2581 Secondary hyperparathyroidism of renal origin: Secondary | ICD-10-CM | POA: Diagnosis not present

## 2017-08-24 DIAGNOSIS — N186 End stage renal disease: Secondary | ICD-10-CM | POA: Diagnosis not present

## 2017-08-24 DIAGNOSIS — D631 Anemia in chronic kidney disease: Secondary | ICD-10-CM | POA: Diagnosis not present

## 2017-08-24 DIAGNOSIS — E611 Iron deficiency: Secondary | ICD-10-CM | POA: Diagnosis not present

## 2017-08-24 DIAGNOSIS — E1122 Type 2 diabetes mellitus with diabetic chronic kidney disease: Secondary | ICD-10-CM | POA: Diagnosis not present

## 2017-08-26 DIAGNOSIS — N186 End stage renal disease: Secondary | ICD-10-CM | POA: Diagnosis not present

## 2017-08-26 DIAGNOSIS — E611 Iron deficiency: Secondary | ICD-10-CM | POA: Diagnosis not present

## 2017-08-26 DIAGNOSIS — D631 Anemia in chronic kidney disease: Secondary | ICD-10-CM | POA: Diagnosis not present

## 2017-08-26 DIAGNOSIS — N2581 Secondary hyperparathyroidism of renal origin: Secondary | ICD-10-CM | POA: Diagnosis not present

## 2017-08-26 DIAGNOSIS — E1122 Type 2 diabetes mellitus with diabetic chronic kidney disease: Secondary | ICD-10-CM | POA: Diagnosis not present

## 2017-08-28 DIAGNOSIS — N2581 Secondary hyperparathyroidism of renal origin: Secondary | ICD-10-CM | POA: Diagnosis not present

## 2017-08-28 DIAGNOSIS — N186 End stage renal disease: Secondary | ICD-10-CM | POA: Diagnosis not present

## 2017-08-28 DIAGNOSIS — E611 Iron deficiency: Secondary | ICD-10-CM | POA: Diagnosis not present

## 2017-08-28 DIAGNOSIS — D631 Anemia in chronic kidney disease: Secondary | ICD-10-CM | POA: Diagnosis not present

## 2017-08-28 DIAGNOSIS — E1122 Type 2 diabetes mellitus with diabetic chronic kidney disease: Secondary | ICD-10-CM | POA: Diagnosis not present

## 2017-08-31 DIAGNOSIS — D631 Anemia in chronic kidney disease: Secondary | ICD-10-CM | POA: Diagnosis not present

## 2017-08-31 DIAGNOSIS — N2581 Secondary hyperparathyroidism of renal origin: Secondary | ICD-10-CM | POA: Diagnosis not present

## 2017-08-31 DIAGNOSIS — N186 End stage renal disease: Secondary | ICD-10-CM | POA: Diagnosis not present

## 2017-08-31 DIAGNOSIS — E1122 Type 2 diabetes mellitus with diabetic chronic kidney disease: Secondary | ICD-10-CM | POA: Diagnosis not present

## 2017-08-31 DIAGNOSIS — E611 Iron deficiency: Secondary | ICD-10-CM | POA: Diagnosis not present

## 2017-09-02 DIAGNOSIS — M6281 Muscle weakness (generalized): Secondary | ICD-10-CM | POA: Diagnosis not present

## 2017-09-02 DIAGNOSIS — E1122 Type 2 diabetes mellitus with diabetic chronic kidney disease: Secondary | ICD-10-CM | POA: Diagnosis not present

## 2017-09-02 DIAGNOSIS — N186 End stage renal disease: Secondary | ICD-10-CM | POA: Diagnosis not present

## 2017-09-02 DIAGNOSIS — D631 Anemia in chronic kidney disease: Secondary | ICD-10-CM | POA: Diagnosis not present

## 2017-09-02 DIAGNOSIS — E611 Iron deficiency: Secondary | ICD-10-CM | POA: Diagnosis not present

## 2017-09-02 DIAGNOSIS — N2581 Secondary hyperparathyroidism of renal origin: Secondary | ICD-10-CM | POA: Diagnosis not present

## 2017-09-04 DIAGNOSIS — D631 Anemia in chronic kidney disease: Secondary | ICD-10-CM | POA: Diagnosis not present

## 2017-09-04 DIAGNOSIS — E1122 Type 2 diabetes mellitus with diabetic chronic kidney disease: Secondary | ICD-10-CM | POA: Diagnosis not present

## 2017-09-04 DIAGNOSIS — N2581 Secondary hyperparathyroidism of renal origin: Secondary | ICD-10-CM | POA: Diagnosis not present

## 2017-09-04 DIAGNOSIS — E611 Iron deficiency: Secondary | ICD-10-CM | POA: Diagnosis not present

## 2017-09-04 DIAGNOSIS — N186 End stage renal disease: Secondary | ICD-10-CM | POA: Diagnosis not present

## 2017-09-07 DIAGNOSIS — N2581 Secondary hyperparathyroidism of renal origin: Secondary | ICD-10-CM | POA: Diagnosis not present

## 2017-09-07 DIAGNOSIS — E1122 Type 2 diabetes mellitus with diabetic chronic kidney disease: Secondary | ICD-10-CM | POA: Diagnosis not present

## 2017-09-07 DIAGNOSIS — E611 Iron deficiency: Secondary | ICD-10-CM | POA: Diagnosis not present

## 2017-09-07 DIAGNOSIS — N186 End stage renal disease: Secondary | ICD-10-CM | POA: Diagnosis not present

## 2017-09-07 DIAGNOSIS — D631 Anemia in chronic kidney disease: Secondary | ICD-10-CM | POA: Diagnosis not present

## 2017-09-07 DIAGNOSIS — M6281 Muscle weakness (generalized): Secondary | ICD-10-CM | POA: Diagnosis not present

## 2017-09-08 DIAGNOSIS — K115 Sialolithiasis: Secondary | ICD-10-CM | POA: Diagnosis not present

## 2017-09-08 DIAGNOSIS — M6281 Muscle weakness (generalized): Secondary | ICD-10-CM | POA: Diagnosis not present

## 2017-09-09 DIAGNOSIS — D631 Anemia in chronic kidney disease: Secondary | ICD-10-CM | POA: Diagnosis not present

## 2017-09-09 DIAGNOSIS — E1122 Type 2 diabetes mellitus with diabetic chronic kidney disease: Secondary | ICD-10-CM | POA: Diagnosis not present

## 2017-09-09 DIAGNOSIS — N186 End stage renal disease: Secondary | ICD-10-CM | POA: Diagnosis not present

## 2017-09-09 DIAGNOSIS — E611 Iron deficiency: Secondary | ICD-10-CM | POA: Diagnosis not present

## 2017-09-09 DIAGNOSIS — M6281 Muscle weakness (generalized): Secondary | ICD-10-CM | POA: Diagnosis not present

## 2017-09-09 DIAGNOSIS — N2581 Secondary hyperparathyroidism of renal origin: Secondary | ICD-10-CM | POA: Diagnosis not present

## 2017-09-10 ENCOUNTER — Ambulatory Visit (INDEPENDENT_AMBULATORY_CARE_PROVIDER_SITE_OTHER): Payer: Medicare Other | Admitting: Cardiovascular Disease

## 2017-09-10 ENCOUNTER — Encounter: Payer: Self-pay | Admitting: Cardiovascular Disease

## 2017-09-10 VITALS — BP 152/58 | HR 66 | Ht 65.0 in | Wt 162.0 lb

## 2017-09-10 DIAGNOSIS — N186 End stage renal disease: Secondary | ICD-10-CM

## 2017-09-10 DIAGNOSIS — E1142 Type 2 diabetes mellitus with diabetic polyneuropathy: Secondary | ICD-10-CM | POA: Diagnosis not present

## 2017-09-10 DIAGNOSIS — I5032 Chronic diastolic (congestive) heart failure: Secondary | ICD-10-CM | POA: Diagnosis not present

## 2017-09-10 DIAGNOSIS — E785 Hyperlipidemia, unspecified: Secondary | ICD-10-CM | POA: Diagnosis not present

## 2017-09-10 DIAGNOSIS — I700 Atherosclerosis of aorta: Secondary | ICD-10-CM | POA: Diagnosis not present

## 2017-09-10 DIAGNOSIS — I480 Paroxysmal atrial fibrillation: Secondary | ICD-10-CM

## 2017-09-10 DIAGNOSIS — I10 Essential (primary) hypertension: Secondary | ICD-10-CM | POA: Diagnosis not present

## 2017-09-10 DIAGNOSIS — Z79899 Other long term (current) drug therapy: Secondary | ICD-10-CM

## 2017-09-10 DIAGNOSIS — E43 Unspecified severe protein-calorie malnutrition: Secondary | ICD-10-CM | POA: Diagnosis not present

## 2017-09-10 DIAGNOSIS — J90 Pleural effusion, not elsewhere classified: Secondary | ICD-10-CM

## 2017-09-10 DIAGNOSIS — M6281 Muscle weakness (generalized): Secondary | ICD-10-CM | POA: Diagnosis not present

## 2017-09-10 NOTE — Progress Notes (Signed)
Cardiology Consultation Note:    Date:  09/10/2017   ID:  [name] James, DOB 08/26/1936, [name]  PCP:  [name], MD  Cardiologist:  Sanda Klein, MD    Referring MD: [name], MD   chief complaint: Follow-up paroxysmal atrial fibrillation  [name] James is a 81 y.o. female who is being seen today for the evaluation of atrial fibrillation at the request of [name], MD.   History of Present Illness:    [name] James is a 81 y.o. female with a hx of Paroxysmal atrial fibrillation in the setting of acute illness in 2015. This is her first cardiology follow-up since that time. She has diabetes mellitus, end-stage renal disease, known aortic and coronary atherosclerosis, but with a normal nuclear stress test and normal left ventricular ejection fraction 2015. Echo described "moderate" LVH, normal LVEF, normal left atrial size  She has been on amiodarone without any major episodes of atrial fibrillation since 2015. No overt side effects from amiodarone to date. She is not on anticoagulation since she had problems with GI bleeding at the time of that presentation. Renal function never recovered and she has been on chronic hemodialysis (MWF, Dr. [name]) since 2015. She is a permanent resident at [facility].  She continues to progress with chronic anemia, but not more so than the typical patient with severe kidney disease. Most recent hemoglobin was 8.2 on August 20, 9.8 on September 5. Dialysis is often complicated by hypotension. She takes midodrine on dialysis days. She is on a low dose of beta blocker that she skips if her blood pressure is low. She does not take any other vasoactive medications that could cause low blood pressure. Her dry weight is set at the 162 pounds. This is her exact weight today. She still makes urine once or twice a day. Her feet are swollen most of the time, but not always. She is on chronic oxygen. As long as she wears the oxygen she  does not complain of shortness of breath. She is extremely sedentary. Dialysis is via a left elbow AV fistula.  In January of this year she was hospitalized with "flu" and developed pneumonia. Thoracentesis was considered, but the radiologist thought it would be too dangerous. The chest CT showed left lung volume loss probably secondary to mucus impaction, rather than a large effusion. Also noted to have a right lower lobe pneumonia. A follow-up single view AP chest x-ray performed on May 8 showed "probable left pleural effusion, left basilar atelectasis or pneumonia could be present but obscured by the fluid". Mention is made of aortic arch calcification. Liver function tests were abnormal. Her statin was stopped and amiodarone was continued. Left lung collapse was documented and felt to be secondary to mucous plugging. The ECGs and rhythm strips recorded during that hospitalization all show normal sinus rhythm, no evidence of atrial fibrillation.  Past Medical History:  Diagnosis Date  . Anemia, chronic disease   . Anxiety   . Arthritis    knees  . Atrial fibrillation ([place])   . Barrett esophagus   . Benign paroxysmal positional vertigo   . Breast cancer ([place]) 1980s   bil mastectomies, no radiation or chemo  . Cataract   . Chest pain, atypical 12/05/2008   R/Lmv- normal perfusion all regions, noe ECG changes   . CHF (congestive heart failure) ([place]) 05/13/2011   echo - DJ>24%; stage 1 diastolic dysfunction; elevated LV filling pressure, MAC  . CKD (chronic kidney disease),  stage III   . Claudication ([place]) 10/30/2005   doppler - normal evaluation, no evidence of aneurysm, diameter reduction, dissection, compression or vascular abnormality)  . Complication of anesthesia   . Dyslipidemia   . Dysrhythmia    Atrial Fibrillation  . Edema   . ESRD (end stage renal disease) ([place])   . Family history of adverse reaction to anesthesia    son also has nausea   . Fever blister 12/2016  . Gastritis  and gastroduodenitis   . GERD (gastroesophageal reflux disease)   . Gout   . Hemodialysis-associated hypotension   . HLD (hyperlipidemia)   . Hypertension   . IBS (irritable bowel syndrome)   . Insomnia   . Morbid obesity ([place])   . PAF (paroxysmal atrial fibrillation) ([place])   . Peripheral neuropathy   . PONV (postoperative nausea and vomiting)   . Renal cyst   . Renal insufficiency 10/30/2005   doppler - abn resistance consistent w/ parenchymal disease  . Renovascular hypertension   . Type 2 diabetes mellitus with diabetic neuropathy, with long-term current use of insulin ([place])   . Vertigo     Past Surgical History:  Procedure Laterality Date  . ANGIOPLASTY Left 02/12/2015   Procedure: ANGIOPLASTY;  Surgeon: Angelia Mould, MD;  Location: [facility] CATH LAB;  Service: Cardiovascular;  Laterality: Left;  AVF  . AV FISTULA PLACEMENT Left 11/30/2014   Procedure: ARTERIOVENOUS (AV) FISTULA CREATION LEFT ARM;  Surgeon: Angelia Mould, MD;  Location: [place];  Service: Vascular;  Laterality: Left;  . AV FISTULA PLACEMENT Left 03/20/2015   Procedure:  Inserton of Left Upper Arm Gortex Graft;  Surgeon: Angelia Mould, MD;  Location: [place];  Service: Vascular;  Laterality: Left;  . AV FISTULA PLACEMENT Left 04/12/2015   Procedure: INSERTION OF LEFT ARM  ARTERIOVENOUS GORE-TEX GRAFT ;  Surgeon: Angelia Mould, MD;  Location: [place];  Service: Vascular;  Laterality: Left;  . BACK SURGERY  ~1980  . CATARACT EXTRACTION Right   . CHOLECYSTECTOMY    . ESOPHAGOGASTRODUODENOSCOPY N/A 10/24/2014   Procedure: ESOPHAGOGASTRODUODENOSCOPY (EGD);  Surgeon: Jerene Bears, MD;  Location[facility] ENDOSCOPY;  Service: Endoscopy;  Laterality: N/A;  . EXCHANGE OF A DIALYSIS CATHETER Left 11/30/2014   Procedure: EXCHANGE OF A DIALYSIS CATHETER, LEFT INTERNAL JUGULAR;  Surgeon: Angelia Mould, MD;  Location: [place];  Service: Vascular;  Laterality: Left;  . EYE SURGERY Left    transplant  .  FISTULOGRAM N/A 02/12/2015   Procedure: FISTULOGRAM;  Surgeon: Angelia Mould, MD;  Location: [facility] CATH LAB;  Service: Cardiovascular;  Laterality: N/A;  . HEMORRHOID SURGERY    . LIGATION OF ARTERIOVENOUS  FISTULA Left 03/20/2015   Procedure: LIGATION OF ARTERIOVENOUS  FISTULA;  Surgeon: Angelia Mould, MD;  Location: [place];  Service: Vascular;  Laterality: Left;  Marland Kitchen MASTECTOMY Bilateral ~1982   bil breast reconstruction with implants  . ORIF TIBIA & FIBULA FRACTURES Left 2007   also had left non displaced malleolar fracture.   Marland Kitchen REMOVAL OF GRAFT Left 04/12/2015   Procedure: REMOVAL OF LEFT ARM ARTERIOVENOUS GORE-TEX GRAFT;  Surgeon: Angelia Mould, MD;  Location: [facility] OR;  Service: Vascular;  Laterality: Left;    Current Medications: Current Meds  Medication Sig  . acetaminophen (TYLENOL) 325 MG tablet Take 650 mg by mouth every 6 (six) hours as needed.  Marland Kitchen allopurinol (ZYLOPRIM) 100 MG tablet Take 100 mg by mouth daily.  Marland Kitchen amiodarone (PACERONE) 200 MG tablet Take  200 mg by mouth daily.   Marland Kitchen aspirin EC 325 MG EC tablet Take 1 tablet (325 mg total) by mouth daily.  . B Complex-C-Folic Acid (NEPHRO-VITE PO) Take 1 tablet by mouth at bedtime.   . calcium carbonate (TUMS - DOSED IN MG ELEMENTAL CALCIUM) 500 MG chewable tablet Chew 1 tablet by mouth 3 (three) times daily with meals.  . diazepam (VALIUM) 5 MG tablet Take one tablet by mouth every night at bedtime for rest  . dorzolamide-timolol (COSOPT) 22.3-6.8 MG/ML ophthalmic solution Place 1 drop into the left eye 2 (two) times daily. Wait 2-5 minutes when giving two eye meds  . fluticasone (FLONASE ALLERGY RELIEF) 50 MCG/ACT nasal spray Place 2 sprays into both nostrils daily.  . Hydrocortisone (GERHARDT'S BUTT CREAM) CREA Apply 1 application topically 3 (three) times daily.  . Melatonin 5 MG TABS Take 5 mg by mouth at bedtime.   . metoprolol tartrate (LOPRESSOR) 25 MG tablet Take 0.5 tablets (12.5 mg total) by mouth 2 (two)  times daily. Hold if sbp less than 100 or heart rate less than 50, hold on dialysis day.  . midodrine (PROAMATINE) 10 MG tablet Take 10 mg by mouth every Monday, Wednesday, and Friday. On dialysis days  . OXYGEN Inhale 2 L/min into the lungs. 24/7  . phenazopyridine (PYRIDIUM) 100 MG tablet Take 100 mg by mouth 2 (two) times daily.  . promethazine (PHENERGAN) 25 MG tablet Take 25 mg by mouth every 8 (eight) hours as needed for nausea or vomiting.  . saccharomyces boulardii (FLORASTOR) 250 MG capsule Take 250 mg by mouth 2 (two) times daily.  . traMADol (ULTRAM) 50 MG tablet Take 50 mg by mouth 3 (three) times daily as needed for moderate pain.  . Vancomycin (VANCOCIN) 750-5 MG/150ML-% SOLN Inject 750 mg into the vein. Every Monday, Wednesday and Friday. Dialysis.     Allergies:   Erythromycin; Erythromycin base; Penicillins; Codeine; and Penicillin g   Social History   Social History  . Marital status: Divorced    Spouse name: N/A  . Number of children: N/A  . Years of education: N/A   Social History Main Topics  . Smoking status: Never Smoker  . Smokeless tobacco: Never Used  . Alcohol use No  . Drug use: No  . Sexual activity: Not Asked   Other Topics Concern  . None   Social History Narrative  . None     Family History: The patient's family history includes Cancer in her mother and sister; Diabetes in her mother; Heart attack in her father; Heart disease in her mother; Parkinson's disease in her brother; Stroke in her father. ROS:   Please see the history of present illness.     All other systems reviewed and are negative.  EKGs/Labs/Other Studies Reviewed:    The following studies were reviewed today: Extensive review of x-rays, ECGs and notes from hospitalizations in 2015 and January-February 2018, echocardiogram, notes from nephrologist and primary care provider  EKG:  EKG is ordered today.  The ekg ordered today demonstrates normal sinus rhythm, minor nonspecific  T-wave changes, prolonged QTc 505 ms  Recent Labs: 10/22/2016: TSH 2.58 01/25/2017: B Natriuretic Peptide 214.8 01/29/2017: ALT 113 08/10/2017: BUN 7; Creatinine, Ser 1.55; Hemoglobin 8.2; Platelets 299; Potassium 3.2; Sodium 137  Recent Lipid Panel    Component Value Date/Time   CHOL 110 10/22/2016   TRIG 169 (A) 10/22/2016   HDL 29 (A) 10/22/2016   LDLCALC 47 10/22/2016    Physical Exam:  VS:  BP (!) 152/58   Pulse 66   Ht _0  (1.651 m)   Wt 162 lb (73.5 kg)   SpO2 99%   BMI 26.96 kg/m     Wt Readings from Last 3 Encounters:  09/10/17 162 lb (73.5 kg)  05/19/17 162 lb 12.8 oz (73.8 kg)  05/19/17 161 lb (73 kg)     GEN: Appears pale and chronically ill, a little fragile. Well nourished, well developed in no acute distress. Wearing oxygen, no cyanosis HEENT: Normal NECK: Very hard to see JVD (possibly scarring due to previous central lines?); No carotid bruits LYMPHATICS: No lymphadenopathy CARDIAC: RRR, no murmurs, rubs, gallops. One-2+ pedal and ankle edema, symmetrical. Normal distal pulses. Left elbow AV fistula with excellent bruit and thrill. RESPIRATORY:  Dullness to percussion, egophony and reduced breath sounds in the left base. No rales are heard.  ABDOMEN: Soft, non-tender, non-distended MUSCULOSKELETAL:  No edema; No deformity  SKIN: Warm and dry NEUROLOGIC:  Alert and oriented x 3 PSYCHIATRIC:  Normal affect   ASSESSMENT:    1. Paroxysmal atrial fibrillation (HCC)   2. Chronic diastolic congestive heart failure ([place])   3. ESRD (end stage renal disease) ([place])   4. Essential hypertension   5. Diabetic polyneuropathy associated with type 2 diabetes mellitus ([place])   6. Pleural effusion, left   7. Atherosclerosis of aorta ([place])   8. Severe protein-calorie malnutrition ([place])   9. Medication management    PLAN:    In order of problems listed above:  1. AFib: As far as I can tell this has not been documented since the 2015 hospitalization. She did not  have atrial fibrillation during hospitalization with pneumonia and respiratory distress earlier this year. I think we should try to wean off the amiodarone, especially since she had mildly abnormal liver function tests. Would like to take another look at her left ventricular ejection fraction left atrial size first. With persistent anemia and overall fragile status and malnutrition, still is probably not a good candidate for anticoagulation, despite her high embolic risk. CHADSVasc 4 (age 5, gender, PAD). 2. CHF: The pedal edema and dyspnea could represent congestive heart failure and I am tempted to recommend challenging her dry weight, but she has had problems with low blood pressure. It's just as likely that the edema is related to severe malnutrition/hypoalbuminemia (most recent albumin 1.5). Dyspnea could be related to her lung abnormalities .Will check an echocardiogram. 3. ESRD: On dialysis MWF 4. Hx of HTN: Her blood pressure is now normal. In fact she requires midodrine on dialysis days. I think I would recommend discontinuing the tiny dose of metoprolol altogether. 5. Hx of DM: No longer requires any treatment for diabetes. Most recent hemoglobin A1c was 5.1%. 6. L pleural effusion: I'm concerned by her lung exam and the fact that she still requires oxygen. Not sure whether she has atelectasis or a fluid collection in the left chest. I recommended a chest x-ray PA and lateral. 7. Ao atherosclerosis: Has evidence of peripheral arterial disease as well as coronary calcification on chest CT, thankfully asymptomatic. Had a normal nuclear stress test in 2015. The focus should be on risk factor modification. If her liver function tests have normalized, especially if we discontinue her amiodarone, would like to restart a statin. Will need an updated lipid profile. 8. Hypoalbuminemia: Poor prognostic feature, regardless of what else we identify during her workup.   Medication Adjustments/Labs and Tests  Ordered: Current medicines are reviewed at length with  the patient today.  Concerns regarding medicines are outlined above.  Orders Placed This Encounter  Procedures  . DG Chest 2 View  . Comprehensive metabolic panel  . TSH  . EKG 12-Lead  . ECHOCARDIOGRAM COMPLETE   No orders of the defined types were placed in this encounter.   Signed, Sanda Klein, MD  09/10/2017 12:04 PM    [place]

## 2017-09-10 NOTE — Addendum Note (Signed)
Addended by: Diana Eves on: 09/10/2017 01:52 PM   Modules accepted: Orders

## 2017-09-10 NOTE — Patient Instructions (Addendum)
Medication Instructions: Dr Sallyanne Kuster recommends that you continue on your current medications as directed. Please refer to the Current Medication list given to you today.  Labwork: Your physician recommends that you return for lab work with your next dialysis appointment.  Testing/Procedures: 1. Echocardiogram - Your physician has requested that you have an echocardiogram. Echocardiography is a painless test that uses sound waves to create images of your heart. It provides your doctor with information about the size and shape of your heart and how well your heart's chambers and valves are working. This procedure takes approximately one hour. There are no restrictions for this procedure. **This will be performed at our [facility] location - [address], [address]  2. Chest X-ray - A chest x-ray takes a picture of the organs and structures inside the chest, including the heart, lungs, and blood vessels. This test can show several things, including, whether the heart is enlarges; whether fluid is building up in the lungs; and whether pacemaker / defibrillator leads are still in place.  Follow-up: Dr Sallyanne Kuster recommends that you schedule a follow-up appointment first available.  If you need a refill on your cardiac medications before your next appointment, please call your pharmacy.

## 2017-09-11 DIAGNOSIS — N186 End stage renal disease: Secondary | ICD-10-CM | POA: Diagnosis not present

## 2017-09-11 DIAGNOSIS — M6281 Muscle weakness (generalized): Secondary | ICD-10-CM | POA: Diagnosis not present

## 2017-09-11 DIAGNOSIS — D631 Anemia in chronic kidney disease: Secondary | ICD-10-CM | POA: Diagnosis not present

## 2017-09-11 DIAGNOSIS — N2581 Secondary hyperparathyroidism of renal origin: Secondary | ICD-10-CM | POA: Diagnosis not present

## 2017-09-11 DIAGNOSIS — E1122 Type 2 diabetes mellitus with diabetic chronic kidney disease: Secondary | ICD-10-CM | POA: Diagnosis not present

## 2017-09-11 DIAGNOSIS — E611 Iron deficiency: Secondary | ICD-10-CM | POA: Diagnosis not present

## 2017-09-14 DIAGNOSIS — E1122 Type 2 diabetes mellitus with diabetic chronic kidney disease: Secondary | ICD-10-CM | POA: Diagnosis not present

## 2017-09-14 DIAGNOSIS — N186 End stage renal disease: Secondary | ICD-10-CM | POA: Diagnosis not present

## 2017-09-14 DIAGNOSIS — D631 Anemia in chronic kidney disease: Secondary | ICD-10-CM | POA: Diagnosis not present

## 2017-09-14 DIAGNOSIS — M6281 Muscle weakness (generalized): Secondary | ICD-10-CM | POA: Diagnosis not present

## 2017-09-14 DIAGNOSIS — N2581 Secondary hyperparathyroidism of renal origin: Secondary | ICD-10-CM | POA: Diagnosis not present

## 2017-09-14 DIAGNOSIS — E611 Iron deficiency: Secondary | ICD-10-CM | POA: Diagnosis not present

## 2017-09-15 DIAGNOSIS — M6281 Muscle weakness (generalized): Secondary | ICD-10-CM | POA: Diagnosis not present

## 2017-09-16 DIAGNOSIS — G4700 Insomnia, unspecified: Secondary | ICD-10-CM | POA: Diagnosis not present

## 2017-09-16 DIAGNOSIS — E1122 Type 2 diabetes mellitus with diabetic chronic kidney disease: Secondary | ICD-10-CM | POA: Diagnosis not present

## 2017-09-16 DIAGNOSIS — N2581 Secondary hyperparathyroidism of renal origin: Secondary | ICD-10-CM | POA: Diagnosis not present

## 2017-09-16 DIAGNOSIS — N186 End stage renal disease: Secondary | ICD-10-CM | POA: Diagnosis not present

## 2017-09-16 DIAGNOSIS — E611 Iron deficiency: Secondary | ICD-10-CM | POA: Diagnosis not present

## 2017-09-16 DIAGNOSIS — Z13220 Encounter for screening for lipoid disorders: Secondary | ICD-10-CM | POA: Diagnosis not present

## 2017-09-16 DIAGNOSIS — K210 Gastro-esophageal reflux disease with esophagitis: Secondary | ICD-10-CM | POA: Diagnosis not present

## 2017-09-16 DIAGNOSIS — D631 Anemia in chronic kidney disease: Secondary | ICD-10-CM | POA: Diagnosis not present

## 2017-09-16 DIAGNOSIS — R7989 Other specified abnormal findings of blood chemistry: Secondary | ICD-10-CM | POA: Diagnosis not present

## 2017-09-16 DIAGNOSIS — F419 Anxiety disorder, unspecified: Secondary | ICD-10-CM | POA: Diagnosis not present

## 2017-09-17 DIAGNOSIS — M6281 Muscle weakness (generalized): Secondary | ICD-10-CM | POA: Diagnosis not present

## 2017-09-18 DIAGNOSIS — D631 Anemia in chronic kidney disease: Secondary | ICD-10-CM | POA: Diagnosis not present

## 2017-09-18 DIAGNOSIS — E611 Iron deficiency: Secondary | ICD-10-CM | POA: Diagnosis not present

## 2017-09-18 DIAGNOSIS — N186 End stage renal disease: Secondary | ICD-10-CM | POA: Diagnosis not present

## 2017-09-18 DIAGNOSIS — E1122 Type 2 diabetes mellitus with diabetic chronic kidney disease: Secondary | ICD-10-CM | POA: Diagnosis not present

## 2017-09-18 DIAGNOSIS — N2581 Secondary hyperparathyroidism of renal origin: Secondary | ICD-10-CM | POA: Diagnosis not present

## 2017-09-20 DIAGNOSIS — N186 End stage renal disease: Secondary | ICD-10-CM | POA: Diagnosis not present

## 2017-09-20 DIAGNOSIS — E1122 Type 2 diabetes mellitus with diabetic chronic kidney disease: Secondary | ICD-10-CM | POA: Diagnosis not present

## 2017-09-20 DIAGNOSIS — Z992 Dependence on renal dialysis: Secondary | ICD-10-CM | POA: Diagnosis not present

## 2017-09-21 DIAGNOSIS — Z992 Dependence on renal dialysis: Secondary | ICD-10-CM | POA: Diagnosis not present

## 2017-09-21 DIAGNOSIS — D631 Anemia in chronic kidney disease: Secondary | ICD-10-CM | POA: Diagnosis not present

## 2017-09-21 DIAGNOSIS — E611 Iron deficiency: Secondary | ICD-10-CM | POA: Diagnosis not present

## 2017-09-21 DIAGNOSIS — N2581 Secondary hyperparathyroidism of renal origin: Secondary | ICD-10-CM | POA: Diagnosis not present

## 2017-09-21 DIAGNOSIS — D509 Iron deficiency anemia, unspecified: Secondary | ICD-10-CM | POA: Diagnosis not present

## 2017-09-21 DIAGNOSIS — N390 Urinary tract infection, site not specified: Secondary | ICD-10-CM | POA: Diagnosis not present

## 2017-09-21 DIAGNOSIS — A499 Bacterial infection, unspecified: Secondary | ICD-10-CM | POA: Diagnosis not present

## 2017-09-21 DIAGNOSIS — N186 End stage renal disease: Secondary | ICD-10-CM | POA: Diagnosis not present

## 2017-09-21 DIAGNOSIS — E1122 Type 2 diabetes mellitus with diabetic chronic kidney disease: Secondary | ICD-10-CM | POA: Diagnosis not present

## 2017-09-21 DIAGNOSIS — I871 Compression of vein: Secondary | ICD-10-CM | POA: Diagnosis not present

## 2017-09-22 ENCOUNTER — Ambulatory Visit (HOSPITAL_COMMUNITY)
Admission: RE | Admit: 2017-09-22 | Discharge: 2017-09-22 | Disposition: A | Discharge status: Home / Self Care | Payer: Medicare Other | Source: Ambulatory Visit | Attending: Cardiovascular Disease | Admitting: Cardiovascular Disease

## 2017-09-22 ENCOUNTER — Ambulatory Visit (HOSPITAL_COMMUNITY): Payer: Medicare Other

## 2017-09-22 DIAGNOSIS — I5032 Chronic diastolic (congestive) heart failure: Secondary | ICD-10-CM | POA: Diagnosis not present

## 2017-09-22 DIAGNOSIS — I132 Hypertensive heart and chronic kidney disease with heart failure and with stage 5 chronic kidney disease, or end stage renal disease: Secondary | ICD-10-CM | POA: Insufficient documentation

## 2017-09-22 DIAGNOSIS — M6281 Muscle weakness (generalized): Secondary | ICD-10-CM | POA: Diagnosis not present

## 2017-09-22 DIAGNOSIS — I209 Angina pectoris, unspecified: Secondary | ICD-10-CM | POA: Insufficient documentation

## 2017-09-22 DIAGNOSIS — I480 Paroxysmal atrial fibrillation: Secondary | ICD-10-CM | POA: Insufficient documentation

## 2017-09-22 DIAGNOSIS — N186 End stage renal disease: Secondary | ICD-10-CM | POA: Insufficient documentation

## 2017-09-22 DIAGNOSIS — E1122 Type 2 diabetes mellitus with diabetic chronic kidney disease: Secondary | ICD-10-CM | POA: Insufficient documentation

## 2017-09-22 DIAGNOSIS — J90 Pleural effusion, not elsewhere classified: Secondary | ICD-10-CM | POA: Insufficient documentation

## 2017-09-22 NOTE — Progress Notes (Signed)
  Echocardiogram 2D Echocardiogram has been performed.  [name] James 09/22/2017, 12:18 PM

## 2017-09-23 DIAGNOSIS — N390 Urinary tract infection, site not specified: Secondary | ICD-10-CM | POA: Diagnosis not present

## 2017-09-23 DIAGNOSIS — D509 Iron deficiency anemia, unspecified: Secondary | ICD-10-CM | POA: Diagnosis not present

## 2017-09-23 DIAGNOSIS — A499 Bacterial infection, unspecified: Secondary | ICD-10-CM | POA: Diagnosis not present

## 2017-09-23 DIAGNOSIS — E1122 Type 2 diabetes mellitus with diabetic chronic kidney disease: Secondary | ICD-10-CM | POA: Diagnosis not present

## 2017-09-23 DIAGNOSIS — E611 Iron deficiency: Secondary | ICD-10-CM | POA: Diagnosis not present

## 2017-09-23 DIAGNOSIS — N186 End stage renal disease: Secondary | ICD-10-CM | POA: Diagnosis not present

## 2017-09-24 ENCOUNTER — Telehealth: Payer: Self-pay | Admitting: Cardiovascular Disease

## 2017-09-24 DIAGNOSIS — N390 Urinary tract infection, site not specified: Secondary | ICD-10-CM | POA: Diagnosis not present

## 2017-09-24 DIAGNOSIS — J90 Pleural effusion, not elsewhere classified: Secondary | ICD-10-CM

## 2017-09-24 DIAGNOSIS — J918 Pleural effusion in other conditions classified elsewhere: Secondary | ICD-10-CM | POA: Diagnosis not present

## 2017-09-24 NOTE — Telephone Encounter (Signed)
OK to order non-contrast chest CT for left pleural effusion

## 2017-09-24 NOTE — Telephone Encounter (Signed)
Spoke with daughter Kenney Houseman ([place]). Notified her of results. She voiced understanding. She states she prefers patient to have a chest CT (per echo report viewed in MyChart). She states patient had a CXR a few weeks ago at her facility ([facility]). She states it is difficult for her to get around b/c she is wheelchair bound.  Phone: [phone] ([place])  Fax: [phone] for [facility] - faxed echo report w/medication changes __________________________________  Durward Fortes would send note to Dr. Sallyanne Kuster to advise on imaging studies

## 2017-09-24 NOTE — Telephone Encounter (Signed)
Notes recorded by Sanda Klein, MD on 09/23/2017 at 3:26 PM EDT Good news: the left heart size and pumping strength are normal, the left atrium is not enlarged. This lessens the likelihood that atrial fibrillation will be a frequent problem. Please stop the amiodarone.  There is still evidence of an organized left chest fluid collection seen incidentally on echo. I had also ordered a CXR, but she has not yet had it.  . I believe the pleural effusion is is why she still needs oxygen. While there may be some echo findings of "increased filling pressures", these are not serious in my opinion and I would not change treatment.

## 2017-09-24 NOTE — Telephone Encounter (Signed)
Daughter notified that MD has ordered this study. Order placed in epic to be done at [facility] (reason: patient could not have outpatient echo @ [facility] office d/t being wheelchair bound and had to go to [facility] for this).   Staff message to scheduler to arrange appt for study

## 2017-09-24 NOTE — Telephone Encounter (Signed)
New Message ° ° pt verbalized that she is returning call for rn °

## 2017-09-25 DIAGNOSIS — N186 End stage renal disease: Secondary | ICD-10-CM | POA: Diagnosis not present

## 2017-09-25 DIAGNOSIS — A499 Bacterial infection, unspecified: Secondary | ICD-10-CM | POA: Diagnosis not present

## 2017-09-25 DIAGNOSIS — E611 Iron deficiency: Secondary | ICD-10-CM | POA: Diagnosis not present

## 2017-09-25 DIAGNOSIS — N390 Urinary tract infection, site not specified: Secondary | ICD-10-CM | POA: Diagnosis not present

## 2017-09-25 DIAGNOSIS — D509 Iron deficiency anemia, unspecified: Secondary | ICD-10-CM | POA: Diagnosis not present

## 2017-09-25 DIAGNOSIS — E1122 Type 2 diabetes mellitus with diabetic chronic kidney disease: Secondary | ICD-10-CM | POA: Diagnosis not present

## 2017-09-28 DIAGNOSIS — N390 Urinary tract infection, site not specified: Secondary | ICD-10-CM | POA: Diagnosis not present

## 2017-09-28 DIAGNOSIS — E611 Iron deficiency: Secondary | ICD-10-CM | POA: Diagnosis not present

## 2017-09-28 DIAGNOSIS — A499 Bacterial infection, unspecified: Secondary | ICD-10-CM | POA: Diagnosis not present

## 2017-09-28 DIAGNOSIS — E1122 Type 2 diabetes mellitus with diabetic chronic kidney disease: Secondary | ICD-10-CM | POA: Diagnosis not present

## 2017-09-28 DIAGNOSIS — D509 Iron deficiency anemia, unspecified: Secondary | ICD-10-CM | POA: Diagnosis not present

## 2017-09-28 DIAGNOSIS — N186 End stage renal disease: Secondary | ICD-10-CM | POA: Diagnosis not present

## 2017-09-29 ENCOUNTER — Ambulatory Visit (INDEPENDENT_AMBULATORY_CARE_PROVIDER_SITE_OTHER): Payer: Medicare Other | Admitting: Family

## 2017-09-29 ENCOUNTER — Encounter: Payer: Self-pay | Admitting: Family

## 2017-09-29 VITALS — BP 128/64 | HR 59 | Temp 97.1°F | Resp 20 | Ht 65.0 in | Wt 162.0 lb

## 2017-09-29 DIAGNOSIS — N186 End stage renal disease: Secondary | ICD-10-CM

## 2017-09-29 DIAGNOSIS — Z992 Dependence on renal dialysis: Secondary | ICD-10-CM

## 2017-09-29 DIAGNOSIS — T82590D Other mechanical complication of surgically created arteriovenous fistula, subsequent encounter: Secondary | ICD-10-CM | POA: Diagnosis not present

## 2017-09-29 NOTE — Progress Notes (Signed)
CC: Patient requests consult re AV graft  History of Present Illness  [name] James is a 81 y.o. (1936-09-23) female who is s/p insertion of  left upper arm AV graft and removal of old forearm graft on 04/12/15 by Dr. Scot Dock. Shehad a previous left radiocephalic fistula which failed. She then had a forearm graft. The brachial vein was fairly small and this clotted fairly early. Therefore Dr. Scot Dock elected to place a new left upper arm graft.  She returned on 10-08-16 with report of hardened area at center of graft that is limiting area of graft that can be used. Area is painful, occasionally warm to touch.  When I saw on on 10-08-16 there was a hardened area at center of graft that was limiting area of graft that could be used. Area was painful, occasionally warm to touch.   Dr. Scot Dock reviewed results of dialysis access duplex performed that day.  Mild dilatation was noted in the mid segment, stenosis was not significant.  Advised that the two areas being accessed be avoided, to access other areas of the graft. If pt continued to have problems with the graft, she would need to make an appointment with Dr. Scot Dock to discuss creation of another access site.  Pt returns today as she expresses frustration with having to [facility] about every month to check her AV graft for problems during HD, and she misses her HD appointment. Daughter states that pt does fine for a while in HD, then has access issues or bleeding issues in HD. And last Friday she had a procedure at [facility] to open up a narrow part of the AV graft. Daughter and pt want to know if it is now worth it to look for another access.   She dialyzes M-W-F via the left upper arm AV graft at [facility].  Dr. Lyda Kalata is pt's nephrologist.  Daughter states that pt has had several dilatations of the left upper arm access by the nephrology group.   Daughter states pt has a hx of GI bleed in December, 2015. She was given  IV heparin at that time for atrial fib, pt had rectal bleeding, sees Dr. Hilarie Fredrickson.   She is on a single agent antiplatelet medication and no anticoagulants.   The patient is right hand dominant.   Pt states that her left hand stays cold.   Pt is in [place] facility since she is unable to walk and care for her self adequately. The inability to walk started when she had renal failure and CHF about 2015.     Past Medical History:  Diagnosis Date  . Anemia, chronic disease   . Anxiety   . Arthritis    knees  . Atrial fibrillation ([place])   . Barrett esophagus   . Benign paroxysmal positional vertigo   . Breast cancer ([place]) 1980s   bil mastectomies, no radiation or chemo  . Cataract   . Chest pain, atypical 12/05/2008   R/Lmv- normal perfusion all regions, noe ECG changes   . CHF (congestive heart failure) ([place]) 05/13/2011   echo - YI>01%; stage 1 diastolic dysfunction; elevated LV filling pressure, MAC  . CKD (chronic kidney disease), stage III ([place])   . Claudication ([place]) 10/30/2005   doppler - normal evaluation, no evidence of aneurysm, diameter reduction, dissection, compression or vascular abnormality)  . Complication of anesthesia   . Dyslipidemia   . Dysrhythmia    Atrial Fibrillation  .  Edema   . ESRD (end stage renal disease) ([place])   . Family history of adverse reaction to anesthesia    son also has nausea   . Fever blister 12/2016  . Gastritis and gastroduodenitis   . GERD (gastroesophageal reflux disease)   . Gout   . Hemodialysis-associated hypotension   . HLD (hyperlipidemia)   . Hypertension   . IBS (irritable bowel syndrome)   . Insomnia   . Morbid obesity ([place])   . PAF (paroxysmal atrial fibrillation) ([place])   . Peripheral neuropathy   . PONV (postoperative nausea and vomiting)   . Renal cyst   . Renal insufficiency 10/30/2005   doppler - abn resistance consistent w/ parenchymal disease  . Renovascular hypertension   . Type 2 diabetes mellitus  with diabetic neuropathy, with long-term current use of insulin ([place])   . Vertigo     Social History Social History  Substance Use Topics  . Smoking status: Never Smoker  . Smokeless tobacco: Never Used  . Alcohol use No    Family History Family History  Problem Relation Age of Onset  . Cancer Mother   . Diabetes Mother   . Heart disease Mother   . Heart attack Father   . Stroke Father   . Parkinson's disease Brother   . Cancer Sister     Surgical History Past Surgical History:  Procedure Laterality Date  . ANGIOPLASTY Left 02/12/2015   Procedure: ANGIOPLASTY;  Surgeon: Angelia Mould, MD;  Location: [facility] CATH LAB;  Service: Cardiovascular;  Laterality: Left;  AVF  . AV FISTULA PLACEMENT Left 11/30/2014   Procedure: ARTERIOVENOUS (AV) FISTULA CREATION LEFT ARM;  Surgeon: Angelia Mould, MD;  Location: [place];  Service: Vascular;  Laterality: Left;  . AV FISTULA PLACEMENT Left 03/20/2015   Procedure:  Inserton of Left Upper Arm Gortex Graft;  Surgeon: Angelia Mould, MD;  Location: [place];  Service: Vascular;  Laterality: Left;  . AV FISTULA PLACEMENT Left 04/12/2015   Procedure: INSERTION OF LEFT ARM  ARTERIOVENOUS GORE-TEX GRAFT ;  Surgeon: Angelia Mould, MD;  Location: [place];  Service: Vascular;  Laterality: Left;  . BACK SURGERY  ~1980  . CATARACT EXTRACTION Right   . CHOLECYSTECTOMY    . ESOPHAGOGASTRODUODENOSCOPY N/A 10/24/2014   Procedure: ESOPHAGOGASTRODUODENOSCOPY (EGD);  Surgeon: Jerene Bears, MD;  Location: [facility] ENDOSCOPY;  Service: Endoscopy;  Laterality: N/A;  . EXCHANGE OF A DIALYSIS CATHETER Left 11/30/2014   Procedure: EXCHANGE OF A DIALYSIS CATHETER, LEFT INTERNAL JUGULAR;  Surgeon: Angelia Mould, MD;  Location: [place];  Service: Vascular;  Laterality: Left;  . EYE SURGERY Left    transplant  . FISTULOGRAM N/A 02/12/2015   Procedure: FISTULOGRAM;  Surgeon: Angelia Mould, MD;  [facility] C[facility] LAB;  Service: Cardiovascular;   Laterality: N/A;  . HEMORRHOID SURGERY    . LIGATION OF ARTERIOVENOUS  FISTULA Left 03/20/2015   Procedure: LIGATION OF ARTERIOVENOUS  FISTULA;  Surgeon: Angelia Mould, MD;  Location: [place];  Service: Vascular;  Laterality: Left;  Marland Kitchen MASTECTOMY Bilateral ~1982   bil breast reconstruction with implants  . ORIF TIBIA & FIBULA FRACTURES Left 2007   also had left non displaced malleolar fracture.   Marland Kitchen REMOVAL OF GRAFT Left 04/12/2015   Procedure: REMOVAL OF LEFT ARM ARTERIOVENOUS GORE-TEX GRAFT;  Surgeon: Angelia Mould, MD;  Location: [place];  Service: Vascular;  Laterality: Left;    Allergies  Allergen Reactions  . Erythromycin Swelling  .  Erythromycin Base     Other reaction(s): GI Upset (intolerance)  . Penicillins Other (See Comments)    Unknown allergic reaction.  She has taken keflex and Rocephin many times without problems  . Codeine Rash and Palpitations    Other reaction(s): GI Upset (intolerance)  . Penicillin G Rash    Current Outpatient Prescriptions  Medication Sig Dispense Refill  . acetaminophen (TYLENOL) 325 MG tablet Take 650 mg by mouth every 6 (six) hours as needed.    Marland Kitchen allopurinol (ZYLOPRIM) 100 MG tablet Take 100 mg by mouth daily.    Marland Kitchen amiodarone (PACERONE) 200 MG tablet Take 200 mg by mouth daily.     Marland Kitchen aspirin EC 325 MG EC tablet Take 1 tablet (325 mg total) by mouth daily. 30 tablet 0  . B Complex-C-Folic Acid (NEPHRO-VITE PO) Take 1 tablet by mouth at bedtime.     . calcium carbonate (TUMS - DOSED IN MG ELEMENTAL CALCIUM) 500 MG chewable tablet Chew 1 tablet by mouth 3 (three) times daily with meals.    . diazepam (VALIUM) 5 MG tablet Take one tablet by mouth every night at bedtime for rest 30 tablet 0  . dorzolamide-timolol (COSOPT) 22.3-6.8 MG/ML ophthalmic solution Place 1 drop into the left eye 2 (two) times daily. Wait 2-5 minutes when giving two eye meds    . fluticasone (FLONASE ALLERGY RELIEF) 50 MCG/ACT nasal spray Place 2 sprays into both  nostrils daily.    . Hydrocortisone (GERHARDT'S BUTT CREAM) CREA Apply 1 application topically 3 (three) times daily.    . Melatonin 5 MG TABS Take 5 mg by mouth at bedtime.     . metoprolol tartrate (LOPRESSOR) 25 MG tablet Take 0.5 tablets (12.5 mg total) by mouth 2 (two) times daily. Hold if sbp less than 100 or heart rate less than 50, hold on dialysis day. 60 tablet 0  . midodrine (PROAMATINE) 10 MG tablet Take 10 mg by mouth every Monday, Wednesday, and Friday. On dialysis days    . OXYGEN Inhale 2 L/min into the lungs. 24/7    . phenazopyridine (PYRIDIUM) 100 MG tablet Take 100 mg by mouth 2 (two) times daily.    . promethazine (PHENERGAN) 25 MG tablet Take 25 mg by mouth every 8 (eight) hours as needed for nausea or vomiting.    . saccharomyces boulardii (FLORASTOR) 250 MG capsule Take 250 mg by mouth 2 (two) times daily.    . traMADol (ULTRAM) 50 MG tablet Take 50 mg by mouth 3 (three) times daily as needed for moderate pain.    . Vancomycin (VANCOCIN) 750-5 MG/150ML-% SOLN Inject 750 mg into the vein. Every Monday, Wednesday and Friday. Dialysis.     No current facility-administered medications for this visit.      REVIEW OF SYSTEMS: see HPI for pertinent positives and negatives    PHYSICAL EXAMINATION:  Vitals:   09/29/17 1158  BP: 128/64  Pulse: (!) 59  Resp: 20  Temp: (!) 97.1 F (36.2 C)  TempSrc: Oral  SpO2: 100%  Weight: 162 lb (73.5 kg)  Height: 5' 5"  (1.651 m)   Body mass index is 26.96 kg/m.  General: The patient appears their stated age, seated in w/c, mild pallor  HEENT:  No gross abnormalities Pulmonary: Respirations are non-labored, 2L/[place] in use Abdomen: Soft and non-tender with normal pitched bowel sounds.. Musculoskeletal: There are no major deformities.   Neurologic: No focal weakness or paresthesias are detected. Right hand grip strength is 4/5, left is 5/5.  Skin: There are no ulcers or rashes noted. Psychiatric: The patient has normal  affect. Cardiovascular: There is a regular rate and rhythm without significant murmur appreciated. Bilateral radial and ulnar pulses are not palpable. Left upper arm AV graft thrill is palpable with audible bruit. There is no thinning of skin over graft noted. There are no open wounds at the graft site. Mild ecchymosis over and surrounding graft, mild swelling adjacent to graft.    Medical Decision Making  MILTON STREICHER is a 81 y.o. female who is s/p insertion of  left upper arm AV graft and Removal of old forearm graft on 04/12/15 by Dr. Scot Dock. Dr. Donnetta Hutching spoke with pt and daughter and examined pt. Pt and daughter said they want [facility] to address the next issue that arises with pt AV graft, that needs surgery or a fistulagram, but that pt may continue to need [facility] to intervene to stent or dilate the current AV graft to keep it usable for as long as possible.  Dr. Donnetta Hutching states that [facility] would intervene to schedule pt for fistula gram with lysis at [facility], this would be scheduled at the request of pt or Dr. Lyda Kalata.    NICKEL, Sharmon Leyden, RN, MSN, FNP-C [facility] Office: [phone]  09/29/2017, 12:07 PM  Clinic MD: Early

## 2017-09-30 DIAGNOSIS — N390 Urinary tract infection, site not specified: Secondary | ICD-10-CM | POA: Diagnosis not present

## 2017-09-30 DIAGNOSIS — D509 Iron deficiency anemia, unspecified: Secondary | ICD-10-CM | POA: Diagnosis not present

## 2017-09-30 DIAGNOSIS — E611 Iron deficiency: Secondary | ICD-10-CM | POA: Diagnosis not present

## 2017-09-30 DIAGNOSIS — E1122 Type 2 diabetes mellitus with diabetic chronic kidney disease: Secondary | ICD-10-CM | POA: Diagnosis not present

## 2017-09-30 DIAGNOSIS — N186 End stage renal disease: Secondary | ICD-10-CM | POA: Diagnosis not present

## 2017-09-30 DIAGNOSIS — A499 Bacterial infection, unspecified: Secondary | ICD-10-CM | POA: Diagnosis not present

## 2017-10-01 ENCOUNTER — Ambulatory Visit: Payer: Medicare Other | Admitting: Physician Assistant

## 2017-10-02 DIAGNOSIS — N390 Urinary tract infection, site not specified: Secondary | ICD-10-CM | POA: Diagnosis not present

## 2017-10-02 DIAGNOSIS — E611 Iron deficiency: Secondary | ICD-10-CM | POA: Diagnosis not present

## 2017-10-02 DIAGNOSIS — D509 Iron deficiency anemia, unspecified: Secondary | ICD-10-CM | POA: Diagnosis not present

## 2017-10-02 DIAGNOSIS — A499 Bacterial infection, unspecified: Secondary | ICD-10-CM | POA: Diagnosis not present

## 2017-10-02 DIAGNOSIS — E1122 Type 2 diabetes mellitus with diabetic chronic kidney disease: Secondary | ICD-10-CM | POA: Diagnosis not present

## 2017-10-02 DIAGNOSIS — N186 End stage renal disease: Secondary | ICD-10-CM | POA: Diagnosis not present

## 2017-10-05 ENCOUNTER — Encounter (HOSPITAL_COMMUNITY): Payer: Self-pay

## 2017-10-05 ENCOUNTER — Ambulatory Visit (HOSPITAL_COMMUNITY)
Admission: RE | Admit: 2017-10-05 | Discharge: 2017-10-05 | Disposition: A | Discharge status: Home / Self Care | Payer: Medicare Other | Source: Ambulatory Visit | Attending: Cardiovascular Disease | Admitting: Cardiovascular Disease

## 2017-10-05 DIAGNOSIS — E1122 Type 2 diabetes mellitus with diabetic chronic kidney disease: Secondary | ICD-10-CM | POA: Diagnosis not present

## 2017-10-05 DIAGNOSIS — I2510 Atherosclerotic heart disease of native coronary artery without angina pectoris: Secondary | ICD-10-CM | POA: Diagnosis not present

## 2017-10-05 DIAGNOSIS — I700 Atherosclerosis of aorta: Secondary | ICD-10-CM | POA: Diagnosis not present

## 2017-10-05 DIAGNOSIS — E611 Iron deficiency: Secondary | ICD-10-CM | POA: Diagnosis not present

## 2017-10-05 DIAGNOSIS — R918 Other nonspecific abnormal finding of lung field: Secondary | ICD-10-CM | POA: Insufficient documentation

## 2017-10-05 DIAGNOSIS — N390 Urinary tract infection, site not specified: Secondary | ICD-10-CM | POA: Diagnosis not present

## 2017-10-05 DIAGNOSIS — D509 Iron deficiency anemia, unspecified: Secondary | ICD-10-CM | POA: Diagnosis not present

## 2017-10-05 DIAGNOSIS — N281 Cyst of kidney, acquired: Secondary | ICD-10-CM | POA: Insufficient documentation

## 2017-10-05 DIAGNOSIS — J90 Pleural effusion, not elsewhere classified: Secondary | ICD-10-CM

## 2017-10-05 DIAGNOSIS — A499 Bacterial infection, unspecified: Secondary | ICD-10-CM | POA: Diagnosis not present

## 2017-10-05 DIAGNOSIS — N186 End stage renal disease: Secondary | ICD-10-CM | POA: Diagnosis not present

## 2017-10-06 ENCOUNTER — Ambulatory Visit (INDEPENDENT_AMBULATORY_CARE_PROVIDER_SITE_OTHER): Payer: Medicare Other | Admitting: Pulmonary Disease

## 2017-10-06 ENCOUNTER — Other Ambulatory Visit: Payer: Self-pay | Admitting: Pulmonary Disease

## 2017-10-06 ENCOUNTER — Encounter: Payer: Self-pay | Admitting: Pulmonary Disease

## 2017-10-06 DIAGNOSIS — Z23 Encounter for immunization: Secondary | ICD-10-CM | POA: Diagnosis not present

## 2017-10-06 DIAGNOSIS — R918 Other nonspecific abnormal finding of lung field: Secondary | ICD-10-CM | POA: Insufficient documentation

## 2017-10-06 DIAGNOSIS — J90 Pleural effusion, not elsewhere classified: Secondary | ICD-10-CM

## 2017-10-06 NOTE — Patient Instructions (Addendum)
   We are going to set you up for a thoracentesis to remove the fluid around your left lung.  We will repeat a CT scan of your chest to follow-up on your lung nodules in 3 months.  Call our office if you have any new breathing problems, cough, or questions before your next appointment.  TESTS ORDERED: 1. US Thoracentesis Left Effusion  - Send fluid cell count with differential, cytology, body fluid culture, glucose, LDH, Total Protein, Amylase, Lipase, Triglycerides, & Albumin.  2. CT Chest w/o in 3 months

## 2017-10-06 NOTE — Addendum Note (Signed)
Addended by: Della Goo C on: 10/06/2017 01:29 PM   Modules accepted: Orders

## 2017-10-06 NOTE — Progress Notes (Signed)
Subjective:    Patient ID: [name] James, female    DOB: 1936/01/15, 81 y.o.   MRN: [id number]  C.C.:  Follow-up for Left Pleural Effusion, Multiple Lung Nodules, & Chronic Hypoxic Respiratory Failure.   HPI Patient previously seen by Dr. Melvyn Novas. Currently receiving Midodrin to help with dialysis on Monday, Wednesday, and Friday. She is also on treatment for a urinary tract infection with Macrobid and also is receiving vancomycin. She has had questionable pneumonia on prior x-ray imaging and has had different courses of antibiotics.   Left Pleural Effusion: Pleural effusion appears to be present at least somewhat in 2016 on abdominal CT imaging. Size has seemed to fluctuate with repeat imaging earlier this year and then again this month.  Multiple Lung Nodules: Noted on most recent CT imaging this month. All nodules are subcentimeter and appear to be primarily groundglass.  Chronic Hypoxic Respiratory Failure:  She is on hemodialysis three times weekly. She denies any dyspnea. She is on 2 L/m chronically.   Review of Systems No chest pain, pressure or tightness. No fever. Does have some chills post hemodialysis. No sweats. She does have some nausea & emesis during dialysis. She is following with GI.   Allergies  Allergen Reactions  . Erythromycin Swelling  . Erythromycin Base     Other reaction(s): GI Upset (intolerance)  . Penicillins Other (See Comments)    Unknown allergic reaction.  She has taken keflex and Rocephin many times without problems  . Codeine Rash and Palpitations    Other reaction(s): GI Upset (intolerance)  . Penicillin G Rash    Current Outpatient Prescriptions on File Prior to Visit  Medication Sig Dispense Refill  . acetaminophen (TYLENOL) 325 MG tablet Take 650 mg by mouth every 6 (six) hours as needed.    Marland Kitchen allopurinol (ZYLOPRIM) 100 MG tablet Take 100 mg by mouth daily.    Marland Kitchen amiodarone (PACERONE) 200 MG tablet Take 200 mg by mouth daily.     Marland Kitchen aspirin EC  325 MG EC tablet Take 1 tablet (325 mg total) by mouth daily. 30 tablet 0  . B Complex-C-Folic Acid (NEPHRO-VITE PO) Take 1 tablet by mouth at bedtime.     . calcium carbonate (TUMS - DOSED IN MG ELEMENTAL CALCIUM) 500 MG chewable tablet Chew 1 tablet by mouth 3 (three) times daily with meals.    . diazepam (VALIUM) 5 MG tablet Take one tablet by mouth every night at bedtime for rest 30 tablet 0  . dorzolamide-timolol (COSOPT) 22.3-6.8 MG/ML ophthalmic solution Place 1 drop into the left eye 2 (two) times daily. Wait 2-5 minutes when giving two eye meds    . fluticasone (FLONASE ALLERGY RELIEF) 50 MCG/ACT nasal spray Place 2 sprays into both nostrils daily.    . Hydrocortisone (GERHARDT'S BUTT CREAM) CREA Apply 1 application topically 3 (three) times daily.    . Melatonin 5 MG TABS Take 5 mg by mouth at bedtime.     . metoprolol tartrate (LOPRESSOR) 25 MG tablet Take 0.5 tablets (12.5 mg total) by mouth 2 (two) times daily. Hold if sbp less than 100 or heart rate less than 50, hold on dialysis day. 60 tablet 0  . midodrine (PROAMATINE) 10 MG tablet Take 10 mg by mouth every Monday, Wednesday, and Friday. On dialysis days    . OXYGEN Inhale 2 L/min into the lungs. 24/7    . phenazopyridine (PYRIDIUM) 100 MG tablet Take 100 mg by mouth 2 (two) times daily.    Marland Kitchen  promethazine (PHENERGAN) 25 MG tablet Take 25 mg by mouth every 8 (eight) hours as needed for nausea or vomiting.    . saccharomyces boulardii (FLORASTOR) 250 MG capsule Take 250 mg by mouth 2 (two) times daily.    . traMADol (ULTRAM) 50 MG tablet Take 50 mg by mouth 3 (three) times daily as needed for moderate pain.    . Vancomycin (VANCOCIN) 750-5 MG/150ML-% SOLN Inject 750 mg into the vein. Every Monday, Wednesday and Friday. Dialysis.     No current facility-administered medications on file prior to visit.     Past Medical History:  Diagnosis Date  . Anemia, chronic disease   . Anxiety   . Arthritis    knees  . Atrial fibrillation  ([place])   . Barrett esophagus   . Benign paroxysmal positional vertigo   . Breast cancer ([place]) 1980s   bil mastectomies, no radiation or chemo  . Cataract   . Chest pain, atypical 12/05/2008   R/Lmv- normal perfusion all regions, noe ECG changes   . CHF (congestive heart failure) ([place]) 05/13/2011   echo - PI>95%; stage 1 diastolic dysfunction; elevated LV filling pressure, MAC  . CKD (chronic kidney disease), stage III ([place])   . Claudication ([place]) 10/30/2005   doppler - normal evaluation, no evidence of aneurysm, diameter reduction, dissection, compression or vascular abnormality)  . Complication of anesthesia   . Dyslipidemia   . Dysrhythmia    Atrial Fibrillation  . Edema   . ESRD (end stage renal disease) ([place])   . Family history of adverse reaction to anesthesia    son also has nausea   . Fever blister 12/2016  . Gastritis and gastroduodenitis   . GERD (gastroesophageal reflux disease)   . Gout   . Hemodialysis-associated hypotension   . HLD (hyperlipidemia)   . Hypertension   . IBS (irritable bowel syndrome)   . Insomnia   . Morbid obesity ([place])   . PAF (paroxysmal atrial fibrillation) ([place])   . Peripheral neuropathy   . PONV (postoperative nausea and vomiting)   . Renal cyst   . Renal insufficiency 10/30/2005   doppler - abn resistance consistent w/ parenchymal disease  . Renovascular hypertension   . Type 2 diabetes mellitus with diabetic neuropathy, with long-term current use of insulin ([place])   . Vertigo     Past Surgical History:  Procedure Laterality Date  . ANGIOPLASTY Left 02/12/2015   Procedure: ANGIOPLASTY;  Surgeon: Angelia Mould, MD;  Location: [facility] CATH LAB;  Service: Cardiovascular;  Laterality: Left;  AVF  . AV FISTULA PLACEMENT Left 11/30/2014   Procedure: ARTERIOVENOUS (AV) FISTULA CREATION LEFT ARM;  Surgeon: Angelia Mould, MD;  Location: [place];  Service: Vascular;  Laterality: Left;  . AV FISTULA PLACEMENT Left 03/20/2015   Procedure:   Inserton of Left Upper Arm Gortex Graft;  Surgeon: Angelia Mould, MD;  Location: [place];  Service: Vascular;  Laterality: Left;  . AV FISTULA PLACEMENT Left 04/12/2015   Procedure: INSERTION OF LEFT ARM  ARTERIOVENOUS GORE-TEX GRAFT ;  Surgeon: Angelia Mould, MD;  Location: [place];  Service: Vascular;  Laterality: Left;  . BACK SURGERY  ~1980  . CATARACT EXTRACTION Right   . CHOLECYSTECTOMY    . ESOPHAGOGASTRODUODENOSCOPY N/A 10/24/2014   Procedure: ESOPHAGOGASTRODUODENOSCOPY (EGD);  Surgeon: Jerene Bears, MD;  Location: [facility] ENDOSCOPY;  Service: Endoscopy;  Laterality: N/A;  . EXCHANGE OF A DIALYSIS CATHETER Left 11/30/2014   Procedure: EXCHANGE OF A DIALYSIS CATHETER, LEFT  INTERNAL JUGULAR;  Surgeon: Angelia Mould, MD;  Location: [place];  Service: Vascular;  Laterality: Left;  . EYE SURGERY Left    transplant  . FISTULOGRAM N/A 02/12/2015   Procedure: FISTULOGRAM;  Surgeon: Angelia Mould, MD;  Location: [facility] CATH LAB;  Service: Cardiovascular;  Laterality: N/A;  . HEMORRHOID SURGERY    . LIGATION OF ARTERIOVENOUS  FISTULA Left 03/20/2015   Procedure: LIGATION OF ARTERIOVENOUS  FISTULA;  Surgeon: Angelia Mould, MD;  Location: [place];  Service: Vascular;  Laterality: Left;  Marland Kitchen MASTECTOMY Bilateral ~1982   bil breast reconstruction with implants  . ORIF TIBIA & FIBULA FRACTURES Left 2007   also had left non displaced malleolar fracture.   Marland Kitchen REMOVAL OF GRAFT Left 04/12/2015   Procedure: REMOVAL OF LEFT ARM ARTERIOVENOUS GORE-TEX GRAFT;  Surgeon: Angelia Mould, MD;  Location: [facility] OR;  Service: Vascular;  Laterality: Left;    Family History  Problem Relation Age of Onset  . Diabetes Mother   . Heart disease Mother   . Non-Hodgkin's lymphoma Mother   . Heart attack Father   . Stroke Father   . Parkinson's disease Brother   . Lung disease Brother   . Heart disease Brother   . Multiple myeloma Sister     Social History   Social History  . Marital  status: Divorced    Spouse name: N/A  . Number of children: N/A  . Years of education: N/A   Social History Main Topics  . Smoking status: Never Smoker  . Smokeless tobacco: Never Used  . Alcohol use No  . Drug use: No  . Sexual activity: Not Asked   Other Topics Concern  . None   Social History Narrative   [facility] (10/06/17):   Originally from [facility]. Has always lived in [place]. She currently lives in [place]. No bird exposure. [place] Previously traveled to [place], [place], & [place]. Previously worked in a nursery and also a Pharmacist, community.       Objective:   Physical Exam BP 120/68 (BP Location: Left Arm, Cuff Size: Normal)   Pulse 61   Ht _0  (1.626 m)   Wt 164 lb (74.4 kg)   SpO2 98%   BMI 28.15 kg/m  General:  Awake. Alert. No acute distress. Sitting in wheelchair. Accompanied by daughter today.  Integument:  Warm & dry. No rash on exposed skin.  Extremities:  No cyanosis or clubbing.  HEENT:  Moist mucus membranes. No oral ulcers. No scleral injection or icterus.  Cardiovascular:  Regular rate. No edema. Unable to appreciate JVD with by positioning.  Pulmonary:  Diminished breath sounds bilateral lung bases. Normal work of breathing on nasal cannula oxygen. Abdomen: Soft. Normal bowel sounds. Protuberant. Musculoskeletal:  Normal bulk and tone. No joint deformity or effusion appreciated. Neurological:  Cranial nerves 2-12 grossly in tact. No meningismus. Moving all 4 extremities equally.   IMAGING CT CHEST W/O 10/05/17 (personally reviewed by me):  Small to moderate size free-flowing left pleural effusion noted. Adjacent compressed left lower lobe versus consolidation with air bronchograms noted. Borderline subcarinal lymphadenopathy is present. Patient does have multiple bilateral subcentimeter groundglass nodules. Notably there is some splenic calcification. Linear opacity that is primarily subpleural in the right base suggestive of scar tissue formation.  No appreciated pericardial effusion. Compared with his previous CT scan from February 2018 the size of his left pleural effusion has significantly decreased in size. Additionally, there appeared to  be mostly new groundglass nodules within the visualized right lung.  CARDIAC TTE (09/22/17):  Mild LVH with EF 60-65% & normal regional wall motion. Grade 1 diastolic dysfunction with high ventricular filling pressure. LA & RA normal in size. RV normal in size and function. Pulmonary artery systolic pressure 34 mmHg. Poorly visualized aortic valve without obvious stenosis or regurgitation. No mitral stenosis or regurgitation. No pulmonic stenosis. Trivial tricuspid regurgitation. Trivial pericardial effusion. Left pleural effusion was noted.    Assessment & Plan:  81 y.o. female with history of influenza pneumonia earlier this year. Patient also has a known history of chronic hypoxic respiratory failure and a left pleural effusion. Left pleural effusion seems to have fluctuated in size since 2016 by my review. Interestingly, she does have new multiple groundglass nodules bilaterally. These are of unclear etiology and possibly inflammatory or infectious in etiology. However, the patient has no symptoms that would suggest an active pulmonary infection. We discussed thoracentesis both as a diagnostic and therapeutic measure which the patient is in agreement with. We also reviewed her images showing the subcentimeter pulmonary nodules. These did not appear to be consistent with a malignant process. I instructed the patient and her daughter to contact our office if she had questions or concerns before her next appointment.  1. Left pleural effusion:Ordering ultrasound guided left thoracentesis. Pleural fluid to be sent for routine pleural fluid analysis along with cytology, culture, amylase, lipase, and triglycerides. 2. Multiple groundglass nodules:  Plan for repeat CT imaging of the chest in 3 months. 3. Chronic  hypoxic respiratory failure:  Continuing on oxygen as previously prescribed. 4. Health maintenance: Administering high-dose influenza vaccine today. 5. Follow-up: Return to clinic in 3 months or sooner if needed.  Sonia Baller Ashok Cordia, M.D. [facility] Pager:  [phone] After 7pm or if no response, call [phone] 11:42 AM 10/06/17

## 2017-10-07 DIAGNOSIS — A499 Bacterial infection, unspecified: Secondary | ICD-10-CM | POA: Diagnosis not present

## 2017-10-07 DIAGNOSIS — N390 Urinary tract infection, site not specified: Secondary | ICD-10-CM | POA: Diagnosis not present

## 2017-10-07 DIAGNOSIS — D509 Iron deficiency anemia, unspecified: Secondary | ICD-10-CM | POA: Diagnosis not present

## 2017-10-07 DIAGNOSIS — E1122 Type 2 diabetes mellitus with diabetic chronic kidney disease: Secondary | ICD-10-CM | POA: Diagnosis not present

## 2017-10-07 DIAGNOSIS — N186 End stage renal disease: Secondary | ICD-10-CM | POA: Diagnosis not present

## 2017-10-07 DIAGNOSIS — E611 Iron deficiency: Secondary | ICD-10-CM | POA: Diagnosis not present

## 2017-10-09 ENCOUNTER — Ambulatory Visit (HOSPITAL_COMMUNITY): Payer: Medicare Other

## 2017-10-09 DIAGNOSIS — E1122 Type 2 diabetes mellitus with diabetic chronic kidney disease: Secondary | ICD-10-CM | POA: Diagnosis not present

## 2017-10-09 DIAGNOSIS — N390 Urinary tract infection, site not specified: Secondary | ICD-10-CM | POA: Diagnosis not present

## 2017-10-09 DIAGNOSIS — D509 Iron deficiency anemia, unspecified: Secondary | ICD-10-CM | POA: Diagnosis not present

## 2017-10-09 DIAGNOSIS — A499 Bacterial infection, unspecified: Secondary | ICD-10-CM | POA: Diagnosis not present

## 2017-10-09 DIAGNOSIS — E611 Iron deficiency: Secondary | ICD-10-CM | POA: Diagnosis not present

## 2017-10-09 DIAGNOSIS — N186 End stage renal disease: Secondary | ICD-10-CM | POA: Diagnosis not present

## 2017-10-12 DIAGNOSIS — E1122 Type 2 diabetes mellitus with diabetic chronic kidney disease: Secondary | ICD-10-CM | POA: Diagnosis not present

## 2017-10-12 DIAGNOSIS — N390 Urinary tract infection, site not specified: Secondary | ICD-10-CM | POA: Diagnosis not present

## 2017-10-12 DIAGNOSIS — N186 End stage renal disease: Secondary | ICD-10-CM | POA: Diagnosis not present

## 2017-10-12 DIAGNOSIS — D509 Iron deficiency anemia, unspecified: Secondary | ICD-10-CM | POA: Diagnosis not present

## 2017-10-12 DIAGNOSIS — E611 Iron deficiency: Secondary | ICD-10-CM | POA: Diagnosis not present

## 2017-10-12 DIAGNOSIS — A499 Bacterial infection, unspecified: Secondary | ICD-10-CM | POA: Diagnosis not present

## 2017-10-13 ENCOUNTER — Ambulatory Visit (HOSPITAL_COMMUNITY)
Admission: RE | Admit: 2017-10-13 | Discharge: 2017-10-13 | Disposition: A | Discharge status: Home / Self Care | Payer: Medicare Other | Source: Ambulatory Visit | Attending: Student | Admitting: Student

## 2017-10-13 ENCOUNTER — Ambulatory Visit (HOSPITAL_COMMUNITY)
Admission: RE | Admit: 2017-10-13 | Discharge: 2017-10-13 | Disposition: A | Discharge status: Home / Self Care | Payer: Medicare Other | Source: Ambulatory Visit | Attending: Pulmonary Disease | Admitting: Pulmonary Disease

## 2017-10-13 DIAGNOSIS — R846 Abnormal cytological findings in specimens from respiratory organs and thorax: Secondary | ICD-10-CM | POA: Diagnosis not present

## 2017-10-13 DIAGNOSIS — I700 Atherosclerosis of aorta: Secondary | ICD-10-CM | POA: Insufficient documentation

## 2017-10-13 DIAGNOSIS — Z23 Encounter for immunization: Secondary | ICD-10-CM

## 2017-10-13 DIAGNOSIS — R918 Other nonspecific abnormal finding of lung field: Secondary | ICD-10-CM | POA: Diagnosis not present

## 2017-10-13 DIAGNOSIS — J90 Pleural effusion, not elsewhere classified: Secondary | ICD-10-CM | POA: Insufficient documentation

## 2017-10-13 DIAGNOSIS — Z9889 Other specified postprocedural states: Secondary | ICD-10-CM | POA: Diagnosis not present

## 2017-10-13 DIAGNOSIS — J939 Pneumothorax, unspecified: Secondary | ICD-10-CM | POA: Insufficient documentation

## 2017-10-13 LAB — BODY FLUID CELL COUNT WITH DIFFERENTIAL
Lymphs, Fluid: 42 %
MONOCYTE-MACROPHAGE-SEROUS FLUID: 55 % (ref 50–90)
NEUTROPHIL FLUID: 3 % (ref 0–25)
WBC FLUID: 757 uL (ref 0–1000)

## 2017-10-13 LAB — GLUCOSE, PLEURAL OR PERITONEAL FLUID: GLUCOSE FL: 124 mg/dL

## 2017-10-13 LAB — ALBUMIN, PLEURAL OR PERITONEAL FLUID: ALBUMIN FL: 2.2 g/dL

## 2017-10-13 LAB — AMYLASE, PLEURAL OR PERITONEAL FLUID: Amylase, Fluid: 73 U/L

## 2017-10-13 LAB — LACTATE DEHYDROGENASE, PLEURAL OR PERITONEAL FLUID: LD, Fluid: 110 U/L — ABNORMAL HIGH (ref 3–23)

## 2017-10-13 LAB — PROTEIN, PLEURAL OR PERITONEAL FLUID: Total protein, fluid: 4.6 g/dL

## 2017-10-13 MED ORDER — LIDOCAINE HCL 2 % IJ SOLN
INTRAMUSCULAR | Status: AC
  Filled 2017-10-13: qty 10

## 2017-10-13 NOTE — Procedures (Signed)
PROCEDURE SUMMARY:  Successful US guided diagnostic and therapeutic left thoracentesis. Yielded 400 mL of hazy, yellow fluid. Pt tolerated procedure well. No immediate complications.  Specimen was sent for labs. CXR ordered.  Docia Barrier PA-C 10/13/2017 12:58 PM

## 2017-10-13 NOTE — Progress Notes (Signed)
Patient with pneumothorax post-procedure.  Interval CXR obtained 1 hr later which shows no change in left-sided pneumothorax.   Patient is stable.  No increased work of breathing or respiratory distress.  She is on home O2 at baseline.  She is planning to return to her SNF.  Stable for discharge home.  Patient aware of findings and plan.  She is instructed to present to ED if change in symptoms or worsening of status.  Her son and daughter are aware and involved in her care.  All questions answered.   Brynda Greathouse, MS RD PA-C 4:14 PM

## 2017-10-13 NOTE — Progress Notes (Signed)
LMTCB

## 2017-10-14 DIAGNOSIS — A499 Bacterial infection, unspecified: Secondary | ICD-10-CM | POA: Diagnosis not present

## 2017-10-14 DIAGNOSIS — E611 Iron deficiency: Secondary | ICD-10-CM | POA: Diagnosis not present

## 2017-10-14 DIAGNOSIS — D509 Iron deficiency anemia, unspecified: Secondary | ICD-10-CM | POA: Diagnosis not present

## 2017-10-14 DIAGNOSIS — E1122 Type 2 diabetes mellitus with diabetic chronic kidney disease: Secondary | ICD-10-CM | POA: Diagnosis not present

## 2017-10-14 DIAGNOSIS — N390 Urinary tract infection, site not specified: Secondary | ICD-10-CM | POA: Diagnosis not present

## 2017-10-14 DIAGNOSIS — N186 End stage renal disease: Secondary | ICD-10-CM | POA: Diagnosis not present

## 2017-10-14 LAB — GRAM STAIN

## 2017-10-14 LAB — TRIGLYCERIDES, BODY FLUIDS: Triglycerides, Fluid: 28 mg/dL

## 2017-10-16 DIAGNOSIS — A499 Bacterial infection, unspecified: Secondary | ICD-10-CM | POA: Diagnosis not present

## 2017-10-16 DIAGNOSIS — D509 Iron deficiency anemia, unspecified: Secondary | ICD-10-CM | POA: Diagnosis not present

## 2017-10-16 DIAGNOSIS — N390 Urinary tract infection, site not specified: Secondary | ICD-10-CM | POA: Diagnosis not present

## 2017-10-16 DIAGNOSIS — E1122 Type 2 diabetes mellitus with diabetic chronic kidney disease: Secondary | ICD-10-CM | POA: Diagnosis not present

## 2017-10-16 DIAGNOSIS — E611 Iron deficiency: Secondary | ICD-10-CM | POA: Diagnosis not present

## 2017-10-16 DIAGNOSIS — N186 End stage renal disease: Secondary | ICD-10-CM | POA: Diagnosis not present

## 2017-10-16 LAB — LIPASE, FLUID: LIPASE-FLUID: 14 U/L

## 2017-10-16 NOTE — Progress Notes (Signed)
Spoke with the pt's daughter and [facility]

## 2017-10-18 LAB — CULTURE, BODY FLUID W GRAM STAIN -BOTTLE: Culture: NO GROWTH

## 2017-10-18 LAB — CULTURE, BODY FLUID-BOTTLE

## 2017-10-19 ENCOUNTER — Telehealth: Payer: Self-pay | Admitting: Pulmonary Disease

## 2017-10-19 DIAGNOSIS — E1122 Type 2 diabetes mellitus with diabetic chronic kidney disease: Secondary | ICD-10-CM | POA: Diagnosis not present

## 2017-10-19 DIAGNOSIS — D509 Iron deficiency anemia, unspecified: Secondary | ICD-10-CM | POA: Diagnosis not present

## 2017-10-19 DIAGNOSIS — A499 Bacterial infection, unspecified: Secondary | ICD-10-CM | POA: Diagnosis not present

## 2017-10-19 DIAGNOSIS — J90 Pleural effusion, not elsewhere classified: Secondary | ICD-10-CM

## 2017-10-19 DIAGNOSIS — N186 End stage renal disease: Secondary | ICD-10-CM | POA: Diagnosis not present

## 2017-10-19 DIAGNOSIS — E611 Iron deficiency: Secondary | ICD-10-CM | POA: Diagnosis not present

## 2017-10-19 DIAGNOSIS — N390 Urinary tract infection, site not specified: Secondary | ICD-10-CM | POA: Diagnosis not present

## 2017-10-19 NOTE — Telephone Encounter (Signed)
Notes recorded by Javier Glazier, MD on 10/14/2017 at 4:24 PM EDT Please let the patient know that so far her pleural fluid studies suggest possible inflammation but did not show any cancer cells. Please order a Hepatic Function Panel as well as a serum LDH to compare with her pleural fluid studies. I'm still waiting on the cultures and another part of the test to finalize. Thanks.  Spoke with the pt's daughter regarding the above results  She verbalized understanding  Labs ordered

## 2017-10-20 DIAGNOSIS — Z23 Encounter for immunization: Secondary | ICD-10-CM | POA: Diagnosis not present

## 2017-10-21 DIAGNOSIS — N186 End stage renal disease: Secondary | ICD-10-CM | POA: Diagnosis not present

## 2017-10-21 DIAGNOSIS — E1122 Type 2 diabetes mellitus with diabetic chronic kidney disease: Secondary | ICD-10-CM | POA: Diagnosis not present

## 2017-10-21 DIAGNOSIS — A499 Bacterial infection, unspecified: Secondary | ICD-10-CM | POA: Diagnosis not present

## 2017-10-21 DIAGNOSIS — Z992 Dependence on renal dialysis: Secondary | ICD-10-CM | POA: Diagnosis not present

## 2017-10-21 DIAGNOSIS — D509 Iron deficiency anemia, unspecified: Secondary | ICD-10-CM | POA: Diagnosis not present

## 2017-10-21 DIAGNOSIS — N390 Urinary tract infection, site not specified: Secondary | ICD-10-CM | POA: Diagnosis not present

## 2017-10-21 DIAGNOSIS — E611 Iron deficiency: Secondary | ICD-10-CM | POA: Diagnosis not present

## 2017-10-22 ENCOUNTER — Telehealth: Payer: Self-pay | Admitting: Internal Medicine

## 2017-10-22 NOTE — Telephone Encounter (Signed)
Spoke with the pt's daughter, Kenney Houseman  I advised that the pt needs ov with MW to discuss ct chest results  She states she understands this, but the pt is refusing to see MW again  She wants to switch to another provider here  She can not think of the name of the new doc she wants to see, but has his card at home and she will let us know  She is aware of the switching doc protocol  Please advise thanks!

## 2017-10-22 NOTE — Progress Notes (Signed)
Spoke with the pt's daughter  She states pt refuses to see Dr Melvyn Novas again and wants to switch to another provider  I notified her of the protocol and will forward a msg to [facility]

## 2017-10-22 NOTE — Telephone Encounter (Signed)
She saw nestor re effusion and probably best therefore to let him do the f/u on this and decide who if anyone here needs to f/u further going forward as his pts will need f/u at his direction when he leaves the practice

## 2017-10-22 NOTE — Telephone Encounter (Signed)
LMOMTCB x1 for pt 

## 2017-10-23 DIAGNOSIS — D509 Iron deficiency anemia, unspecified: Secondary | ICD-10-CM | POA: Diagnosis not present

## 2017-10-23 DIAGNOSIS — E1122 Type 2 diabetes mellitus with diabetic chronic kidney disease: Secondary | ICD-10-CM | POA: Diagnosis not present

## 2017-10-23 DIAGNOSIS — N2581 Secondary hyperparathyroidism of renal origin: Secondary | ICD-10-CM | POA: Diagnosis not present

## 2017-10-23 DIAGNOSIS — N186 End stage renal disease: Secondary | ICD-10-CM | POA: Diagnosis not present

## 2017-10-26 DIAGNOSIS — N186 End stage renal disease: Secondary | ICD-10-CM | POA: Diagnosis not present

## 2017-10-26 DIAGNOSIS — E1122 Type 2 diabetes mellitus with diabetic chronic kidney disease: Secondary | ICD-10-CM | POA: Diagnosis not present

## 2017-10-26 DIAGNOSIS — N2581 Secondary hyperparathyroidism of renal origin: Secondary | ICD-10-CM | POA: Diagnosis not present

## 2017-10-26 DIAGNOSIS — D509 Iron deficiency anemia, unspecified: Secondary | ICD-10-CM | POA: Diagnosis not present

## 2017-10-26 NOTE — Telephone Encounter (Signed)
lmomtcb x 2 for the pt or [place]

## 2017-10-27 NOTE — Telephone Encounter (Signed)
lmomtcb x 3 for the pt.   

## 2017-10-28 DIAGNOSIS — N186 End stage renal disease: Secondary | ICD-10-CM | POA: Diagnosis not present

## 2017-10-28 DIAGNOSIS — N2581 Secondary hyperparathyroidism of renal origin: Secondary | ICD-10-CM | POA: Diagnosis not present

## 2017-10-28 DIAGNOSIS — D509 Iron deficiency anemia, unspecified: Secondary | ICD-10-CM | POA: Diagnosis not present

## 2017-10-28 DIAGNOSIS — E1122 Type 2 diabetes mellitus with diabetic chronic kidney disease: Secondary | ICD-10-CM | POA: Diagnosis not present

## 2017-10-28 NOTE — Telephone Encounter (Signed)
Unable to reach the pt or tonya and will sign off at this time.

## 2017-10-29 ENCOUNTER — Encounter (INDEPENDENT_AMBULATORY_CARE_PROVIDER_SITE_OTHER): Payer: Self-pay | Admitting: Orthopedic Surgery

## 2017-10-29 ENCOUNTER — Ambulatory Visit (INDEPENDENT_AMBULATORY_CARE_PROVIDER_SITE_OTHER): Payer: Medicare Other | Admitting: Orthopedic Surgery

## 2017-10-29 DIAGNOSIS — E1140 Type 2 diabetes mellitus with diabetic neuropathy, unspecified: Secondary | ICD-10-CM | POA: Diagnosis not present

## 2017-10-29 DIAGNOSIS — B351 Tinea unguium: Secondary | ICD-10-CM

## 2017-10-29 DIAGNOSIS — I87323 Chronic venous hypertension (idiopathic) with inflammation of bilateral lower extremity: Secondary | ICD-10-CM | POA: Diagnosis not present

## 2017-10-29 DIAGNOSIS — Z794 Long term (current) use of insulin: Secondary | ICD-10-CM

## 2017-10-29 NOTE — Progress Notes (Signed)
Office Visit Note   Patient: [name] James           Date of Birth: 11-09-1936           MRN: [id number] Visit Date: 10/29/2017              Requested by: [name], MD [address] [place], [place] [address] PCP: [name], DO  No chief complaint on file.     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 Patient complains of painful onychomycotic nails which she is unable safely trim on her own and complains of venous stasis swelling in both legs.  Assessment & Plan: Visit Diagnoses:  1. Idiopathic chronic venous hypertension of both lower extremities with inflammation   2. Type 2 diabetes mellitus with diabetic neuropathy, with long-term current use of insulin ([place])   3. Onychomycosis of toenail     Plan: Recommended knee-high medical compression stockings to be worn daily. Have provided order to [place] for Vive compression stockings. The nails were trimmed 10 without complications.  Follow-Up Instructions: Return in about 3 months (around 01/29/2018).   Ortho Exam  Patient is alert, oriented, no adenopathy, well-dressed, normal affect, normal respiratory effort. Examination patient endplates in a wheelchair she has a [employer] fast streamer pad beneath her. She has venous stasis swelling in both lower extremities with brawny skin color changes but no ulcers no cellulitis. There are no plantar ulcers. Patient's feet have capillary refill less than 3 seconds there is no ischemic ulcers she does have thickened discolored onychomycotic nails 10. Patient is unable safely trim the nails on her own due to her diabetic insensate neuropathy and nails were trimmed 10 without complications. Patient has pitting edema in both legs.  Imaging: No results found.  Labs: Lab Results  Component Value Date   HGBA1C 5.7 (H) 01/20/2017   HGBA1C 5.5 10/22/2016   HGBA1C 5.1 05/22/2016   ESRSEDRATE 64 (H)  07/17/2006   REPTSTATUS 10/18/2017 FINAL 10/13/2017   REPTSTATUS 10/14/2017 FINAL 10/13/2017   GRAMSTAIN  10/13/2017    CYTOSPIN SMEAR WBC PRESENT,BOTH PMN AND MONONUCLEAR NO ORGANISMS SEEN Performed at [facility], [place] [address] [place], [place] [address]    CULT  10/13/2017    NO GROWTH 5 DAYS Performed at [facility] [address], [place], [place] [address]    [place] 01/24/2017    Orders:  No orders of the defined types were placed in this encounter.  No orders of the defined types were placed in this encounter.    Procedures: No procedures performed  Clinical Data: No additional findings.  ROS:  All other systems negative, except as noted in the HPI. Review of Systems  Constitutional: Negative for chills and fever.  Cardiovascular: Positive for leg swelling.  Skin: Positive for color change. Negative for wound.    Objective: Vital Signs: There were no vitals taken for this visit.  Specialty Comments:  No specialty comments available.  PMFS History: Patient Active Problem List   Diagnosis Date Noted  . Multiple lung nodules on CT 10/06/2017  . Pleural effusion, left 10/06/2017  . Diabetic polyneuropathy associated with type 2 diabetes mellitus ([place]) 07/30/2017  . Onychomycosis of toenail 07/30/2017  . Idiopathic chronic venous hypertension of both lower extremities with inflammation 07/30/2017  . Chronic respiratory failure with hypoxia ([place]) 04/29/2017  . Morbid (severe) obesity due to excess  calories ([place]) 04/29/2017  . LFT elevation   . Advance care planning   . Goals of care, counseling/discussion   . Palliative care by specialist   . Pressure ulcer 01/19/2017  . Long term current use of amiodarone 10/17/2016  . History of pressure ulcer 10/17/2016  . GAD (generalized anxiety disorder) 10/17/2016  . Osteopenia determined by x-ray 04/17/2016  . Controlled type 2 diabetes mellitus with  diabetic polyneuropathy, with long-term current use of insulin ([place]) 04/17/2016  . Type 2 diabetes mellitus with diabetic neuropathy ([place]) 06/08/2015  . Type 2 diabetes, controlled, with renal manifestation ([place]) 06/08/2015  . PVD (peripheral vascular disease) ([place]) 06/08/2015  . Swelling of limb-Left arm 04/10/2015  . Nausea with vomiting 01/12/2015  . AP (abdominal pain) 01/12/2015  . Normocytic anemia 01/12/2015  . Paroxysmal atrial fibrillation ([place]) 12/22/2014  . End stage renal disease ([place])   . Melena   . ESRD on dialysis ([place]) 12/19/2014  . CHF (congestive heart failure) ([place]) 12/19/2014  . Palpitation 12/19/2014  . ESRD (end stage renal disease) ([place]) 12/19/2014  . Anemia, chronic disease 12/13/2014  . Hemodialysis-associated hypotension 12/13/2014  . Pulmonary edema 11/10/2014  . Physical deconditioning 10/29/2014  . Iron deficiency anemia   . Gastritis and gastroduodenitis 10/24/2014  . Heme positive stool 10/24/2014  . Morbid obesity ([place]) 10/18/2014  . Edema 10/18/2014  . Essential hypertension 10/17/2014  . CKD (chronic kidney disease), stage III ([place]) 10/17/2014  . DM type 2, uncontrolled, with renal complications ([place]) 69/62/9528  . Dyslipidemia 10/17/2014  . Anemia 10/17/2014   Past Medical History:  Diagnosis Date  . Anemia, chronic disease   . Anxiety   . Arthritis    knees  . Atrial fibrillation ([place])   . Barrett esophagus   . Benign paroxysmal positional vertigo   . Breast cancer ([place]) 1980s   bil mastectomies, no radiation or chemo  . Cataract   . Chest pain, atypical 12/05/2008   R/Lmv- normal perfusion all regions, noe ECG changes   . CHF (congestive heart failure) ([place]) 05/13/2011   echo - UX>32%; stage 1 diastolic dysfunction; elevated LV filling pressure, MAC  . CKD (chronic kidney disease), stage III ([place])   . Claudication ([place]) 10/30/2005   doppler - normal evaluation, no evidence of aneurysm, diameter reduction, dissection, compression or  vascular abnormality)  . Complication of anesthesia   . Dyslipidemia   . Dysrhythmia    Atrial Fibrillation  . Edema   . ESRD (end stage renal disease) ([place])   . Family history of adverse reaction to anesthesia    son also has nausea   . Fever blister 12/2016  . Gastritis and gastroduodenitis   . GERD (gastroesophageal reflux disease)   . Gout   . Hemodialysis-associated hypotension   . HLD (hyperlipidemia)   . Hypertension   . IBS (irritable bowel syndrome)   . Insomnia   . Morbid obesity ([place])   . PAF (paroxysmal atrial fibrillation) ([place])   . Peripheral neuropathy   . PONV (postoperative nausea and vomiting)   . Renal cyst   . Renal insufficiency 10/30/2005   doppler - abn resistance consistent w/ parenchymal disease  . Renovascular hypertension   . Type 2 diabetes mellitus with diabetic neuropathy, with long-term current use of insulin ([place])   . Vertigo     Family History  Problem Relation Age of Onset  . Diabetes Mother   . Heart disease Mother   . Non-Hodgkin's lymphoma Mother   . Heart attack Father   .  Stroke Father   . Parkinson's disease Brother   . Lung disease Brother   . Heart disease Brother   . Multiple myeloma Sister     Past Surgical History:  Procedure Laterality Date  . BACK SURGERY  ~1980  . CATARACT EXTRACTION Right   . CHOLECYSTECTOMY    . EYE SURGERY Left    transplant  . HEMORRHOID SURGERY    . MASTECTOMY Bilateral ~1982   bil breast reconstruction with implants  . ORIF TIBIA & FIBULA FRACTURES Left 2007   also had left non displaced malleolar fracture.    Social History   Occupational History  . Not on file  Tobacco Use  . Smoking status: Never Smoker  . Smokeless tobacco: Never Used  Substance and Sexual Activity  . Alcohol use: No  . Drug use: No  . Sexual activity: Not on file

## 2017-10-30 DIAGNOSIS — N186 End stage renal disease: Secondary | ICD-10-CM | POA: Diagnosis not present

## 2017-10-30 DIAGNOSIS — N2581 Secondary hyperparathyroidism of renal origin: Secondary | ICD-10-CM | POA: Diagnosis not present

## 2017-10-30 DIAGNOSIS — E1122 Type 2 diabetes mellitus with diabetic chronic kidney disease: Secondary | ICD-10-CM | POA: Diagnosis not present

## 2017-10-30 DIAGNOSIS — D509 Iron deficiency anemia, unspecified: Secondary | ICD-10-CM | POA: Diagnosis not present

## 2017-11-02 DIAGNOSIS — D509 Iron deficiency anemia, unspecified: Secondary | ICD-10-CM | POA: Diagnosis not present

## 2017-11-02 DIAGNOSIS — N186 End stage renal disease: Secondary | ICD-10-CM | POA: Diagnosis not present

## 2017-11-02 DIAGNOSIS — N2581 Secondary hyperparathyroidism of renal origin: Secondary | ICD-10-CM | POA: Diagnosis not present

## 2017-11-02 DIAGNOSIS — E1122 Type 2 diabetes mellitus with diabetic chronic kidney disease: Secondary | ICD-10-CM | POA: Diagnosis not present

## 2017-11-03 ENCOUNTER — Ambulatory Visit: Payer: Medicare Other | Admitting: Pulmonary Disease

## 2017-11-04 DIAGNOSIS — N2581 Secondary hyperparathyroidism of renal origin: Secondary | ICD-10-CM | POA: Diagnosis not present

## 2017-11-04 DIAGNOSIS — N186 End stage renal disease: Secondary | ICD-10-CM | POA: Diagnosis not present

## 2017-11-04 DIAGNOSIS — D509 Iron deficiency anemia, unspecified: Secondary | ICD-10-CM | POA: Diagnosis not present

## 2017-11-04 DIAGNOSIS — E1122 Type 2 diabetes mellitus with diabetic chronic kidney disease: Secondary | ICD-10-CM | POA: Diagnosis not present

## 2017-11-05 DIAGNOSIS — H401132 Primary open-angle glaucoma, bilateral, moderate stage: Secondary | ICD-10-CM | POA: Diagnosis not present

## 2017-11-06 DIAGNOSIS — D509 Iron deficiency anemia, unspecified: Secondary | ICD-10-CM | POA: Diagnosis not present

## 2017-11-06 DIAGNOSIS — N186 End stage renal disease: Secondary | ICD-10-CM | POA: Diagnosis not present

## 2017-11-06 DIAGNOSIS — N2581 Secondary hyperparathyroidism of renal origin: Secondary | ICD-10-CM | POA: Diagnosis not present

## 2017-11-06 DIAGNOSIS — E1122 Type 2 diabetes mellitus with diabetic chronic kidney disease: Secondary | ICD-10-CM | POA: Diagnosis not present

## 2017-11-08 DIAGNOSIS — N186 End stage renal disease: Secondary | ICD-10-CM | POA: Diagnosis not present

## 2017-11-08 DIAGNOSIS — N2581 Secondary hyperparathyroidism of renal origin: Secondary | ICD-10-CM | POA: Diagnosis not present

## 2017-11-08 DIAGNOSIS — E1122 Type 2 diabetes mellitus with diabetic chronic kidney disease: Secondary | ICD-10-CM | POA: Diagnosis not present

## 2017-11-08 DIAGNOSIS — D509 Iron deficiency anemia, unspecified: Secondary | ICD-10-CM | POA: Diagnosis not present

## 2017-11-10 DIAGNOSIS — N2581 Secondary hyperparathyroidism of renal origin: Secondary | ICD-10-CM | POA: Diagnosis not present

## 2017-11-10 DIAGNOSIS — E1122 Type 2 diabetes mellitus with diabetic chronic kidney disease: Secondary | ICD-10-CM | POA: Diagnosis not present

## 2017-11-10 DIAGNOSIS — N186 End stage renal disease: Secondary | ICD-10-CM | POA: Diagnosis not present

## 2017-11-10 DIAGNOSIS — D509 Iron deficiency anemia, unspecified: Secondary | ICD-10-CM | POA: Diagnosis not present

## 2017-11-11 DIAGNOSIS — E119 Type 2 diabetes mellitus without complications: Secondary | ICD-10-CM | POA: Diagnosis not present

## 2017-11-11 DIAGNOSIS — N186 End stage renal disease: Secondary | ICD-10-CM | POA: Diagnosis not present

## 2017-11-11 DIAGNOSIS — F419 Anxiety disorder, unspecified: Secondary | ICD-10-CM | POA: Diagnosis not present

## 2017-11-11 DIAGNOSIS — G4700 Insomnia, unspecified: Secondary | ICD-10-CM | POA: Diagnosis not present

## 2017-11-13 DIAGNOSIS — D509 Iron deficiency anemia, unspecified: Secondary | ICD-10-CM | POA: Diagnosis not present

## 2017-11-13 DIAGNOSIS — N186 End stage renal disease: Secondary | ICD-10-CM | POA: Diagnosis not present

## 2017-11-13 DIAGNOSIS — N2581 Secondary hyperparathyroidism of renal origin: Secondary | ICD-10-CM | POA: Diagnosis not present

## 2017-11-13 DIAGNOSIS — E1122 Type 2 diabetes mellitus with diabetic chronic kidney disease: Secondary | ICD-10-CM | POA: Diagnosis not present

## 2017-11-16 DIAGNOSIS — N186 End stage renal disease: Secondary | ICD-10-CM | POA: Diagnosis not present

## 2017-11-16 DIAGNOSIS — D509 Iron deficiency anemia, unspecified: Secondary | ICD-10-CM | POA: Diagnosis not present

## 2017-11-16 DIAGNOSIS — E1122 Type 2 diabetes mellitus with diabetic chronic kidney disease: Secondary | ICD-10-CM | POA: Diagnosis not present

## 2017-11-16 DIAGNOSIS — N2581 Secondary hyperparathyroidism of renal origin: Secondary | ICD-10-CM | POA: Diagnosis not present

## 2017-11-18 DIAGNOSIS — N2581 Secondary hyperparathyroidism of renal origin: Secondary | ICD-10-CM | POA: Diagnosis not present

## 2017-11-18 DIAGNOSIS — D509 Iron deficiency anemia, unspecified: Secondary | ICD-10-CM | POA: Diagnosis not present

## 2017-11-18 DIAGNOSIS — E1122 Type 2 diabetes mellitus with diabetic chronic kidney disease: Secondary | ICD-10-CM | POA: Diagnosis not present

## 2017-11-18 DIAGNOSIS — N186 End stage renal disease: Secondary | ICD-10-CM | POA: Diagnosis not present

## 2017-11-19 ENCOUNTER — Ambulatory Visit (INDEPENDENT_AMBULATORY_CARE_PROVIDER_SITE_OTHER): Payer: Medicare Other | Admitting: Pulmonary Disease

## 2017-11-19 ENCOUNTER — Ambulatory Visit (INDEPENDENT_AMBULATORY_CARE_PROVIDER_SITE_OTHER)
Admission: RE | Admit: 2017-11-19 | Discharge: 2017-11-19 | Disposition: A | Discharge status: Home / Self Care | Payer: Medicare Other | Source: Ambulatory Visit | Attending: Pulmonary Disease | Admitting: Pulmonary Disease

## 2017-11-19 VITALS — BP 130/70 | HR 54 | Ht 65.0 in

## 2017-11-19 DIAGNOSIS — J939 Pneumothorax, unspecified: Secondary | ICD-10-CM

## 2017-11-19 DIAGNOSIS — J90 Pleural effusion, not elsewhere classified: Secondary | ICD-10-CM

## 2017-11-19 DIAGNOSIS — Z48813 Encounter for surgical aftercare following surgery on the respiratory system: Secondary | ICD-10-CM | POA: Diagnosis not present

## 2017-11-19 NOTE — Addendum Note (Signed)
Addended by: Della Goo C on: 11/19/2017 11:23 AM   Modules accepted: Orders

## 2017-11-19 NOTE — Progress Notes (Signed)
Subjective:    Patient ID: [name] James, female    DOB: 1936-05-28, 81 y.o.   MRN: [id number]  C.C.:  Follow-up for Discordant Exudative Left Pleural Effusion, Left Pneumothorax, Multiple Lung Nodules, & Chronic Hypoxic Respiratory Failure.   HPI Previously seen by Dr. Melvyn Novas.   Discordant exudative left pleural effusion: Status post thoracentesis on 10/23. No serum LDH to confirm a concordant exudate. Cytology and cultures negative. Pleural effusion has been present at least somewhat going back to 2016 on evaluation of abdominal CT imaging. She denies any new breathing problems. She isn't sure her breathing improved after the fluid was removed.   Left pneumothorax: Suspect secondary to trapped lung and pressure dependent parenchymal pleural fistula.  Multiple lung nodules: Noted on CT imaging in October. Plan for repeat CT imaging 3 months hence.  Chronic hypoxic respiratory failure: Prescribed oxygen at 2 L/m. She uses this chronically.  Review of Systems She denies any coughing or wheezing. She reports intermittent nausea but did have nausea yesterday during dialysis. She has had more sinus congestion and drainage. No fever, chills, or sweats.   Allergies  Allergen Reactions  . Erythromycin Swelling  . Erythromycin Base     Other reaction(s): GI Upset (intolerance)  . Penicillins Other (See Comments)    Unknown allergic reaction.  She has taken keflex and Rocephin many times without problems  . Codeine Rash and Palpitations    Other reaction(s): GI Upset (intolerance)  . Penicillin G Rash    Current Outpatient Medications on File Prior to Visit  Medication Sig Dispense Refill  . acetaminophen (TYLENOL) 325 MG tablet Take 650 mg by mouth every 6 (six) hours as needed.    Marland Kitchen allopurinol (ZYLOPRIM) 100 MG tablet Take 100 mg by mouth daily.    Marland Kitchen amiodarone (PACERONE) 200 MG tablet Take 200 mg by mouth daily.     Marland Kitchen aspirin EC 325 MG EC tablet Take 1 tablet (325 mg total) by mouth  daily. 30 tablet 0  . B Complex-C-Folic Acid (NEPHRO-VITE PO) Take 1 tablet by mouth at bedtime.     . calcium carbonate (TUMS - DOSED IN MG ELEMENTAL CALCIUM) 500 MG chewable tablet Chew 1 tablet by mouth 3 (three) times daily with meals.    . diazepam (VALIUM) 5 MG tablet Take one tablet by mouth every night at bedtime for rest 30 tablet 0  . dorzolamide-timolol (COSOPT) 22.3-6.8 MG/ML ophthalmic solution Place 1 drop into the left eye 2 (two) times daily. Wait 2-5 minutes when giving two eye meds    . fluticasone (FLONASE ALLERGY RELIEF) 50 MCG/ACT nasal spray Place 2 sprays into both nostrils daily.    . Hydrocortisone (GERHARDT'S BUTT CREAM) CREA Apply 1 application topically 3 (three) times daily.    . Melatonin 5 MG TABS Take 5 mg by mouth at bedtime.     . metoprolol tartrate (LOPRESSOR) 25 MG tablet Take 0.5 tablets (12.5 mg total) by mouth 2 (two) times daily. Hold if sbp less than 100 or heart rate less than 50, hold on dialysis day. 60 tablet 0  . midodrine (PROAMATINE) 10 MG tablet Take 10 mg by mouth every Monday, Wednesday, and Friday. On dialysis days    . OXYGEN Inhale 2 L/min into the lungs. 24/7    . phenazopyridine (PYRIDIUM) 100 MG tablet Take 100 mg by mouth 2 (two) times daily.    . promethazine (PHENERGAN) 25 MG tablet Take 25 mg by mouth every 8 (eight) hours as needed for  nausea or vomiting.    . saccharomyces boulardii (FLORASTOR) 250 MG capsule Take 250 mg by mouth 2 (two) times daily.    . traMADol (ULTRAM) 50 MG tablet Take 50 mg by mouth 3 (three) times daily as needed for moderate pain.    . Vancomycin (VANCOCIN) 750-5 MG/150ML-% SOLN Inject 750 mg into the vein. Every Monday, Wednesday and Friday. Dialysis.     No current facility-administered medications on file prior to visit.     Past Medical History:  Diagnosis Date  . Anemia, chronic disease   . Anxiety   . Arthritis    knees  . Atrial fibrillation ([place])   . Barrett esophagus   . Benign paroxysmal  positional vertigo   . Breast cancer ([place]) 1980s   bil mastectomies, no radiation or chemo  . Cataract   . Chest pain, atypical 12/05/2008   R/Lmv- normal perfusion all regions, noe ECG changes   . CHF (congestive heart failure) ([place]) 05/13/2011   echo - JJ>00%; stage 1 diastolic dysfunction; elevated LV filling pressure, MAC  . CKD (chronic kidney disease), stage III ([place])   . Claudication ([place]) 10/30/2005   doppler - normal evaluation, no evidence of aneurysm, diameter reduction, dissection, compression or vascular abnormality)  . Complication of anesthesia   . Dyslipidemia   . Dysrhythmia    Atrial Fibrillation  . Edema   . ESRD (end stage renal disease) ([place])   . Family history of adverse reaction to anesthesia    son also has nausea   . Fever blister 12/2016  . Gastritis and gastroduodenitis   . GERD (gastroesophageal reflux disease)   . Gout   . Hemodialysis-associated hypotension   . HLD (hyperlipidemia)   . Hypertension   . IBS (irritable bowel syndrome)   . Insomnia   . Morbid obesity ([place])   . PAF (paroxysmal atrial fibrillation) ([place])   . Peripheral neuropathy   . PONV (postoperative nausea and vomiting)   . Renal cyst   . Renal insufficiency 10/30/2005   doppler - abn resistance consistent w/ parenchymal disease  . Renovascular hypertension   . Type 2 diabetes mellitus with diabetic neuropathy, with long-term current use of insulin ([place])   . Vertigo     Past Surgical History:  Procedure Laterality Date  . ANGIOPLASTY Left 02/12/2015   Procedure: ANGIOPLASTY;  Surgeon: Angelia Mould, MD;  Location: [facility] CATH LAB;  Service: Cardiovascular;  Laterality: Left;  AVF  . AV FISTULA PLACEMENT Left 11/30/2014   Procedure: ARTERIOVENOUS (AV) FISTULA CREATION LEFT ARM;  Surgeon: Angelia Mould, MD;  Location: [place];  Service: Vascular;  Laterality: Left;  . AV FISTULA PLACEMENT Left 03/20/2015   Procedure:  Inserton of Left Upper Arm Gortex Graft;  Surgeon:  Angelia Mould, MD;  Location: [place];  Service: Vascular;  Laterality: Left;  . AV FISTULA PLACEMENT Left 04/12/2015   Procedure: INSERTION OF LEFT ARM  ARTERIOVENOUS GORE-TEX GRAFT ;  Surgeon: Angelia Mould, MD;  Location: [place];  Service: Vascular;  Laterality: Left;  . BACK SURGERY  ~1980  . CATARACT EXTRACTION Right   . CHOLECYSTECTOMY    . ESOPHAGOGASTRODUODENOSCOPY N/A 10/24/2014   Procedure: ESOPHAGOGASTRODUODENOSCOPY (EGD);  Surgeon: Jerene Bears, MD;  Location: [facility] ENDOSCOPY;  Service: Endoscopy;  Laterality: N/A;  . EXCHANGE OF A DIALYSIS CATHETER Left 11/30/2014   Procedure: EXCHANGE OF A DIALYSIS CATHETER, LEFT INTERNAL JUGULAR;  Surgeon: Angelia Mould, MD;  Location: [place];  Service: Vascular;  Laterality:  Left;  . EYE SURGERY Left    transplant  . FISTULOGRAM N/A 02/12/2015   Procedure: FISTULOGRAM;  Surgeon: Angelia Mould, MD;  Location: [facility] CATH LAB;  Service: Cardiovascular;  Laterality: N/A;  . HEMORRHOID SURGERY    . LIGATION OF ARTERIOVENOUS  FISTULA Left 03/20/2015   Procedure: LIGATION OF ARTERIOVENOUS  FISTULA;  Surgeon: Angelia Mould, MD;  Location: [place];  Service: Vascular;  Laterality: Left;  Marland Kitchen MASTECTOMY Bilateral ~1982   bil breast reconstruction with implants  . ORIF TIBIA & FIBULA FRACTURES Left 2007   also had left non displaced malleolar fracture.   Marland Kitchen REMOVAL OF GRAFT Left 04/12/2015   Procedure: REMOVAL OF LEFT ARM ARTERIOVENOUS GORE-TEX GRAFT;  Surgeon: Angelia Mould, MD;  Location: [facility] OR;  Service: Vascular;  Laterality: Left;    Family History  Problem Relation Age of Onset  . Diabetes Mother   . Heart disease Mother   . Non-Hodgkin's lymphoma Mother   . Heart attack Father   . Stroke Father   . Parkinson's disease Brother   . Lung disease Brother   . Heart disease Brother   . Multiple myeloma Sister     Social History   Socioeconomic History  . Marital status: Divorced    Spouse name: Not on file    . Number of children: Not on file  . Years of education: Not on file  . Highest education level: Not on file  Social Needs  . Financial resource strain: Not on file  . Food insecurity - worry: Not on file  . Food insecurity - inability: Not on file  . Transportation needs - medical: Not on file  . Transportation needs - non-medical: Not on file  Occupational History  . Not on file  Tobacco Use  . Smoking status: Never Smoker  . Smokeless tobacco: Never Used  Substance and Sexual Activity  . Alcohol use: No  . Drug use: No  . Sexual activity: Not on file  Other Topics Concern  . Not on file  Social History Narrative   [facility] (10/06/17):   Originally from [facility]. Has always lived in [place]. She currently lives in [place]. No bird exposure. [place] Previously traveled to [place], [place], & [place]. Previously worked in a nursery and also a Pharmacist, community.       Objective:   Physical Exam BP 130/70 (BP Location: Left Arm, Cuff Size: Normal)   Pulse (!) 54   Ht _0  (1.651 m)   SpO2 98%   BMI 27.29 kg/m   General:  Awake. Alert. Obese. Sitting in wheelchair.  Integument:  No rash or bruising on exposed skin. Warm. Extremities:  No cyanosis or clubbing.  HEENT:  Moist mucus membranes. No scleral icterus. No oral ulcers. Cardiovascular:  Regular rate. Trace edema. Unable to appreciate JVD.  Pulmonary:  Minimal basilar crackles with slightly decreased breath sounds left lung base. Normal work of breathing on nasal cannula. Abdomen: Soft. Normal bowel sounds. Protuberant. Musculoskeletal:  Normal bulk and tone. No joint deformity or effusion appreciated. Neurological:  Cranial nerves 2-12 grossly in tact. No meningismus. Moving all 4 extremities equally.   IMAGING PORT CXR 10/13/17 (personally reviewed by me):  Post thoracentesis. Tiny loculated left basilar pneumothorax and left apical pneumothorax. Minimal left basilar opacity persists.  CT CHEST W/O 10/05/17  (previously reviewed by me):  Small to moderate size free-flowing left pleural effusion noted. Adjacent compressed left lower lobe versus consolidation  with air bronchograms noted. Borderline subcarinal lymphadenopathy is present. Patient does have multiple bilateral subcentimeter groundglass nodules. Notably there is some splenic calcification. Linear opacity that is primarily subpleural in the right base suggestive of scar tissue formation. No appreciated pericardial effusion. Compared with his previous CT scan from February 2018 the size of his left pleural effusion has significantly decreased in size. Additionally, there appeared to be mostly new groundglass nodules within the visualized right lung.  CARDIAC TTE (09/22/17):  Mild LVH with EF 60-65% & normal regional wall motion. Grade 1 diastolic dysfunction with high ventricular filling pressure. LA & RA normal in size. RV normal in size and function. Pulmonary artery systolic pressure 34 mmHg. Poorly visualized aortic valve without obvious stenosis or regurgitation. No mitral stenosis or regurgitation. No pulmonic stenosis. Trivial tricuspid regurgitation. Trivial pericardial effusion. Left pleural effusion was noted.  LEFT PFA (10/13/17) Volume: 400 mL Glucose: 124 LDH: 110 Total protein: 4.6 Albumin: 2.2 Lipase: 14 Amylase: 73 Triglycerides: 28 Cell count: 757 (42% lymphs, 3% neutrophils, 55% macrophages/monocytes) Culture: Negative Cytology: Reactive mesothelial cells. No malignancy.    Assessment & Plan:  81 y.o. female with a history of influenza pneumonia earlier this year. Patient has chronic hypoxic respiratory failure as well as a left pleural effusion that seems to have been present since 2016 possibly. Effusion is exudative by protein criteria based on previous serum lab tests. I do not have an LDH to compare to see if this is a concordant exudate. Even so, the lymphocyte predominance would suggest possibly an effusion that was  originally transudative and has subsequently transitioned to exudative with repeated fluid shifts during hemodialysis. Her culture and cytology are negative. Patient experienced no significant symptomatic benefit and had no discomfort from her thoracentesis or her left pneumothorax. Given the pattern on her x-ray I suspect she has trapped lung. I would not recommend decortication in this patient. She is continuing to use oxygen as prescribed. We will reassess her left pneumothorax today. I instructed the patient to contact my office if she had any new breathing problems or questions before her next appointment.  1. Discordant exudative left pleural effusion: Suspect trapped lung. Deferring future/further thoracentesis. 2. Left pneumothorax: Checking chest x-ray PA/LAT today. 3. Chronic hypoxic respiratory failure: Continuing on oxygen as previously prescribed. 4. Multiple groundglass nodules: Planned for repeat CT chest without contrast 3 months from initial scan in October. 5. Health maintenance: Status post flu vaccine October 2018. Reports she had Pneumovax less than 5 years ago.  6. Follow-up: Return to clinic in 3 months or sooner if needed.  Sonia Baller Ashok Cordia, M.D. [facility] Pager:  [phone] After 7pm or if no response, call [phone] 10:54 AM 11/19/17

## 2017-11-19 NOTE — Patient Instructions (Addendum)
   Call my office if you have any new breathing problems before your next appointment.  We are doing an x-ray today to follow-up on the air around your left lung.  We will plan on seeing you back after your CT scan is done in 3 months.   TESTS ORDERED: 1. CXR PA/LAT TODAY

## 2017-11-20 ENCOUNTER — Telehealth: Payer: Self-pay | Admitting: Pulmonary Disease

## 2017-11-20 DIAGNOSIS — Z992 Dependence on renal dialysis: Secondary | ICD-10-CM | POA: Diagnosis not present

## 2017-11-20 DIAGNOSIS — N186 End stage renal disease: Secondary | ICD-10-CM | POA: Diagnosis not present

## 2017-11-20 DIAGNOSIS — D509 Iron deficiency anemia, unspecified: Secondary | ICD-10-CM | POA: Diagnosis not present

## 2017-11-20 DIAGNOSIS — E1122 Type 2 diabetes mellitus with diabetic chronic kidney disease: Secondary | ICD-10-CM | POA: Diagnosis not present

## 2017-11-20 DIAGNOSIS — N2581 Secondary hyperparathyroidism of renal origin: Secondary | ICD-10-CM | POA: Diagnosis not present

## 2017-11-20 NOTE — Telephone Encounter (Signed)
Please let her know that there is no evidence of any pneumonia on her pleural fluid culture or imaging. She also does not need to have the Pneumovax yet. However, if she has not yet had a Prevnar 13 vaccine she should habitus. She gets most of her vaccines at dialysis so they will need to check their first. Thank you.

## 2017-11-20 NOTE — Telephone Encounter (Signed)
Spoke with pt's daughter and advised message from [place]. Nothing further is needed.

## 2017-11-20 NOTE — Telephone Encounter (Signed)
Called call and spoke to pt's daughter, Kenney Houseman. Kenney Houseman states pt had prevar 13 in 2015. Tonya wanted to know if it is time for another injection, as she is concerned with pt's hx of PNA.   JN please advise. Thanks.

## 2017-11-23 DIAGNOSIS — E1122 Type 2 diabetes mellitus with diabetic chronic kidney disease: Secondary | ICD-10-CM | POA: Diagnosis not present

## 2017-11-23 DIAGNOSIS — N186 End stage renal disease: Secondary | ICD-10-CM | POA: Diagnosis not present

## 2017-11-23 DIAGNOSIS — N2581 Secondary hyperparathyroidism of renal origin: Secondary | ICD-10-CM | POA: Diagnosis not present

## 2017-11-25 ENCOUNTER — Telehealth: Payer: Self-pay | Admitting: Pulmonary Disease

## 2017-11-25 DIAGNOSIS — N2581 Secondary hyperparathyroidism of renal origin: Secondary | ICD-10-CM | POA: Diagnosis not present

## 2017-11-25 DIAGNOSIS — R918 Other nonspecific abnormal finding of lung field: Secondary | ICD-10-CM

## 2017-11-25 DIAGNOSIS — E1122 Type 2 diabetes mellitus with diabetic chronic kidney disease: Secondary | ICD-10-CM | POA: Diagnosis not present

## 2017-11-25 DIAGNOSIS — N186 End stage renal disease: Secondary | ICD-10-CM | POA: Diagnosis not present

## 2017-11-25 NOTE — Telephone Encounter (Signed)
[name] James [name] [name] [name] James came to me with an order for CT Chest w/o contrast under JN's name to be done in January 2019 to follow up lung nodules with ov to discuss results  JN will no longer be with the practice and the CT Dept is requesting the order be changed to another provider Per office protocol, order changed to TP   Will forward to TP to make her aware Will sign off   CT CHEST W/O 10/05/17 (previously reviewed by me):  Small to moderate size free-flowing left pleural effusion noted. Adjacent compressed left lower lobe versus consolidation with air bronchograms noted. Borderline subcarinal lymphadenopathy is present. Patient does have multiple bilateral subcentimeter groundglass nodules. Notably there is some splenic calcification. Linear opacity that is primarily subpleural in the right base suggestive of scar tissue formation. No appreciated pericardial effusion. Compared with his previous CT scan from February 2018 the size of his left pleural effusion has significantly decreased in size. Additionally, there appeared to be mostly new groundglass nodules within the visualized right lung.   Patient Instructions   Call my office if you have any new breathing problems before your next appointment.  We are doing an x-ray today to follow-up on the air around your left lung.  We will plan on seeing you back after your CT scan is done in 3 months.

## 2017-11-27 DIAGNOSIS — E1122 Type 2 diabetes mellitus with diabetic chronic kidney disease: Secondary | ICD-10-CM | POA: Diagnosis not present

## 2017-11-27 DIAGNOSIS — N186 End stage renal disease: Secondary | ICD-10-CM | POA: Diagnosis not present

## 2017-11-27 DIAGNOSIS — N2581 Secondary hyperparathyroidism of renal origin: Secondary | ICD-10-CM | POA: Diagnosis not present

## 2017-12-01 ENCOUNTER — Ambulatory Visit: Payer: Medicare Other | Admitting: Internal Medicine

## 2017-12-01 DIAGNOSIS — N2581 Secondary hyperparathyroidism of renal origin: Secondary | ICD-10-CM | POA: Diagnosis not present

## 2017-12-01 DIAGNOSIS — E1122 Type 2 diabetes mellitus with diabetic chronic kidney disease: Secondary | ICD-10-CM | POA: Diagnosis not present

## 2017-12-01 DIAGNOSIS — N186 End stage renal disease: Secondary | ICD-10-CM | POA: Diagnosis not present

## 2017-12-02 DIAGNOSIS — N2581 Secondary hyperparathyroidism of renal origin: Secondary | ICD-10-CM | POA: Diagnosis not present

## 2017-12-02 DIAGNOSIS — N186 End stage renal disease: Secondary | ICD-10-CM | POA: Diagnosis not present

## 2017-12-02 DIAGNOSIS — R278 Other lack of coordination: Secondary | ICD-10-CM | POA: Diagnosis not present

## 2017-12-02 DIAGNOSIS — E1122 Type 2 diabetes mellitus with diabetic chronic kidney disease: Secondary | ICD-10-CM | POA: Diagnosis not present

## 2017-12-02 DIAGNOSIS — M6281 Muscle weakness (generalized): Secondary | ICD-10-CM | POA: Diagnosis not present

## 2017-12-02 DIAGNOSIS — E119 Type 2 diabetes mellitus without complications: Secondary | ICD-10-CM | POA: Diagnosis not present

## 2017-12-02 DIAGNOSIS — T1490XD Injury, unspecified, subsequent encounter: Secondary | ICD-10-CM | POA: Diagnosis not present

## 2017-12-04 DIAGNOSIS — N2581 Secondary hyperparathyroidism of renal origin: Secondary | ICD-10-CM | POA: Diagnosis not present

## 2017-12-04 DIAGNOSIS — E1122 Type 2 diabetes mellitus with diabetic chronic kidney disease: Secondary | ICD-10-CM | POA: Diagnosis not present

## 2017-12-04 DIAGNOSIS — N186 End stage renal disease: Secondary | ICD-10-CM | POA: Diagnosis not present

## 2017-12-07 DIAGNOSIS — N2581 Secondary hyperparathyroidism of renal origin: Secondary | ICD-10-CM | POA: Diagnosis not present

## 2017-12-07 DIAGNOSIS — N186 End stage renal disease: Secondary | ICD-10-CM | POA: Diagnosis not present

## 2017-12-07 DIAGNOSIS — E1122 Type 2 diabetes mellitus with diabetic chronic kidney disease: Secondary | ICD-10-CM | POA: Diagnosis not present

## 2017-12-09 DIAGNOSIS — N186 End stage renal disease: Secondary | ICD-10-CM | POA: Diagnosis not present

## 2017-12-09 DIAGNOSIS — N2581 Secondary hyperparathyroidism of renal origin: Secondary | ICD-10-CM | POA: Diagnosis not present

## 2017-12-09 DIAGNOSIS — E1122 Type 2 diabetes mellitus with diabetic chronic kidney disease: Secondary | ICD-10-CM | POA: Diagnosis not present

## 2017-12-11 DIAGNOSIS — E1122 Type 2 diabetes mellitus with diabetic chronic kidney disease: Secondary | ICD-10-CM | POA: Diagnosis not present

## 2017-12-11 DIAGNOSIS — N2581 Secondary hyperparathyroidism of renal origin: Secondary | ICD-10-CM | POA: Diagnosis not present

## 2017-12-11 DIAGNOSIS — N186 End stage renal disease: Secondary | ICD-10-CM | POA: Diagnosis not present

## 2017-12-13 DIAGNOSIS — N2581 Secondary hyperparathyroidism of renal origin: Secondary | ICD-10-CM | POA: Diagnosis not present

## 2017-12-13 DIAGNOSIS — E1122 Type 2 diabetes mellitus with diabetic chronic kidney disease: Secondary | ICD-10-CM | POA: Diagnosis not present

## 2017-12-13 DIAGNOSIS — N186 End stage renal disease: Secondary | ICD-10-CM | POA: Diagnosis not present

## 2017-12-16 DIAGNOSIS — N2581 Secondary hyperparathyroidism of renal origin: Secondary | ICD-10-CM | POA: Diagnosis not present

## 2017-12-16 DIAGNOSIS — N186 End stage renal disease: Secondary | ICD-10-CM | POA: Diagnosis not present

## 2017-12-16 DIAGNOSIS — E1122 Type 2 diabetes mellitus with diabetic chronic kidney disease: Secondary | ICD-10-CM | POA: Diagnosis not present

## 2017-12-17 ENCOUNTER — Emergency Department (HOSPITAL_COMMUNITY)
Admission: EM | Admit: 2017-12-17 | Discharge: 2017-12-18 | Disposition: A | Discharge status: Home / Self Care | Payer: Medicare Other | Attending: Emergency Medicine | Admitting: Emergency Medicine

## 2017-12-17 ENCOUNTER — Encounter (HOSPITAL_COMMUNITY): Payer: Self-pay | Admitting: Emergency Medicine

## 2017-12-17 ENCOUNTER — Other Ambulatory Visit: Payer: Self-pay

## 2017-12-17 DIAGNOSIS — Y939 Activity, unspecified: Secondary | ICD-10-CM | POA: Diagnosis not present

## 2017-12-17 DIAGNOSIS — T83498A Other mechanical complication of other prosthetic devices, implants and grafts of genital tract, initial encounter: Secondary | ICD-10-CM | POA: Diagnosis not present

## 2017-12-17 DIAGNOSIS — Z452 Encounter for adjustment and management of vascular access device: Secondary | ICD-10-CM | POA: Insufficient documentation

## 2017-12-17 DIAGNOSIS — E1140 Type 2 diabetes mellitus with diabetic neuropathy, unspecified: Secondary | ICD-10-CM | POA: Insufficient documentation

## 2017-12-17 DIAGNOSIS — N186 End stage renal disease: Secondary | ICD-10-CM | POA: Diagnosis not present

## 2017-12-17 DIAGNOSIS — Y999 Unspecified external cause status: Secondary | ICD-10-CM | POA: Diagnosis not present

## 2017-12-17 DIAGNOSIS — I132 Hypertensive heart and chronic kidney disease with heart failure and with stage 5 chronic kidney disease, or end stage renal disease: Secondary | ICD-10-CM | POA: Insufficient documentation

## 2017-12-17 DIAGNOSIS — Z794 Long term (current) use of insulin: Secondary | ICD-10-CM | POA: Diagnosis not present

## 2017-12-17 DIAGNOSIS — Y929 Unspecified place or not applicable: Secondary | ICD-10-CM | POA: Insufficient documentation

## 2017-12-17 DIAGNOSIS — Y33XXXA Other specified events, undetermined intent, initial encounter: Secondary | ICD-10-CM | POA: Diagnosis not present

## 2017-12-17 DIAGNOSIS — R2232 Localized swelling, mass and lump, left upper limb: Secondary | ICD-10-CM | POA: Diagnosis present

## 2017-12-17 DIAGNOSIS — Z992 Dependence on renal dialysis: Secondary | ICD-10-CM | POA: Insufficient documentation

## 2017-12-17 DIAGNOSIS — I5032 Chronic diastolic (congestive) heart failure: Secondary | ICD-10-CM | POA: Insufficient documentation

## 2017-12-17 DIAGNOSIS — M79622 Pain in left upper arm: Secondary | ICD-10-CM | POA: Insufficient documentation

## 2017-12-17 DIAGNOSIS — T148XXA Other injury of unspecified body region, initial encounter: Secondary | ICD-10-CM | POA: Insufficient documentation

## 2017-12-17 DIAGNOSIS — T82898A Other specified complication of vascular prosthetic devices, implants and grafts, initial encounter: Secondary | ICD-10-CM | POA: Diagnosis not present

## 2017-12-17 DIAGNOSIS — S40022A Contusion of left upper arm, initial encounter: Secondary | ICD-10-CM | POA: Diagnosis not present

## 2017-12-17 NOTE — ED Notes (Signed)
ED Provider at bedside. 

## 2017-12-17 NOTE — ED Provider Notes (Signed)
[facility] EMERGENCY DEPARTMENT Provider Note   CSN: [id number] Arrival date & time: 12/17/17  2003     History   Chief Complaint Chief Complaint  Patient presents with  . Arm Swelling  . Vascular Access Problem    HPI [name] James is a 81 y.o. female.  The history is provided by the patient and medical records.     81 year old female with history of anemia, anxiety, arthritis, A. fib, BPPV, CHF, ESRD on hemodialysis (MWF), dyslipidemia, DM 2, presenting to the ED with left upper arm pain and swelling.  Reports she had full dialysis treatment yesterday without any noted complications.  States she did feel the needle "sting" a bit more than normal but nothing excruciating.  States she did not have any issues with bleeding afterwards.  States today the nurses at her facility noticed that her arm seem to be very bruised.  Patient states she has had some mild pain today but states she has been tolerating it well.  She denies any numbness or weakness of her left arm.  She does have history of clotted grafts in the past, has been several years.  She does have failed dialysis graft and left forearm, current access in left upper arm.  She is followed by vascular surgery, Dr. Scot Dock.  No other complaints at this time.  Past Medical History:  Diagnosis Date  . Anemia, chronic disease   . Anxiety   . Arthritis    knees  . Atrial fibrillation ([place])   . Barrett esophagus   . Benign paroxysmal positional vertigo   . Breast cancer ([place]) 1980s   bil mastectomies, no radiation or chemo  . Cataract   . Chest pain, atypical 12/05/2008   R/Lmv- normal perfusion all regions, noe ECG changes   . CHF (congestive heart failure) ([place]) 05/13/2011   echo - OE>70%; stage 1 diastolic dysfunction; elevated LV filling pressure, MAC  . CKD (chronic kidney disease), stage III ([place])   . Claudication ([place]) 10/30/2005   doppler - normal evaluation, no evidence of aneurysm, diameter  reduction, dissection, compression or vascular abnormality)  . Complication of anesthesia   . Dyslipidemia   . Dysrhythmia    Atrial Fibrillation  . Edema   . ESRD (end stage renal disease) ([place])   . Family history of adverse reaction to anesthesia    son also has nausea   . Fever blister 12/2016  . Gastritis and gastroduodenitis   . GERD (gastroesophageal reflux disease)   . Gout   . Hemodialysis-associated hypotension   . HLD (hyperlipidemia)   . Hypertension   . IBS (irritable bowel syndrome)   . Insomnia   . Morbid obesity ([place])   . PAF (paroxysmal atrial fibrillation) ([place])   . Peripheral neuropathy   . PONV (postoperative nausea and vomiting)   . Renal cyst   . Renal insufficiency 10/30/2005   doppler - abn resistance consistent w/ parenchymal disease  . Renovascular hypertension   . Type 2 diabetes mellitus with diabetic neuropathy, with long-term current use of insulin ([place])   . Vertigo     Patient Active Problem List   Diagnosis Date Noted  . Pneumothorax, left 10/13/2017  . Multiple lung nodules on CT 10/06/2017  . Pleural effusion, left 10/06/2017  . Diabetic polyneuropathy associated with type 2 diabetes mellitus ([place]) 07/30/2017  . Onychomycosis of toenail 07/30/2017  . Idiopathic chronic venous hypertension of both lower extremities with inflammation 07/30/2017  . Chronic respiratory failure with  hypoxia ([place]) 04/29/2017  . Morbid (severe) obesity due to excess calories ([place]) 04/29/2017  . LFT elevation   . Advance care planning   . Goals of care, counseling/discussion   . Palliative care by specialist   . Pressure ulcer 01/19/2017  . Long term current use of amiodarone 10/17/2016  . History of pressure ulcer 10/17/2016  . GAD (generalized anxiety disorder) 10/17/2016  . Osteopenia determined by x-ray 04/17/2016  . Controlled type 2 diabetes mellitus with diabetic polyneuropathy, with long-term current use of insulin ([place]) 04/17/2016  . Type 2 diabetes  mellitus with diabetic neuropathy ([place]) 06/08/2015  . Type 2 diabetes, controlled, with renal manifestation ([place]) 06/08/2015  . PVD (peripheral vascular disease) ([place]) 06/08/2015  . Swelling of limb-Left arm 04/10/2015  . Nausea with vomiting 01/12/2015  . AP (abdominal pain) 01/12/2015  . Normocytic anemia 01/12/2015  . Paroxysmal atrial fibrillation ([place]) 12/22/2014  . End stage renal disease ([place])   . Melena   . ESRD on dialysis ([place]) 12/19/2014  . CHF (congestive heart failure) ([place]) 12/19/2014  . Palpitation 12/19/2014  . ESRD (end stage renal disease) ([place]) 12/19/2014  . Anemia, chronic disease 12/13/2014  . Hemodialysis-associated hypotension 12/13/2014  . Pulmonary edema 11/10/2014  . Physical deconditioning 10/29/2014  . Iron deficiency anemia   . Gastritis and gastroduodenitis 10/24/2014  . Heme positive stool 10/24/2014  . Morbid obesity ([place]) 10/18/2014  . Edema 10/18/2014  . Essential hypertension 10/17/2014  . CKD (chronic kidney disease), stage III ([place]) 10/17/2014  . DM type 2, uncontrolled, with renal complications ([place]) 78/29/5621  . Dyslipidemia 10/17/2014  . Anemia 10/17/2014    Past Surgical History:  Procedure Laterality Date  . ANGIOPLASTY Left 02/12/2015   Procedure: ANGIOPLASTY;  Surgeon: Angelia Mould, MD;  Location: [facility] CATH LAB;  Service: Cardiovascular;  Laterality: Left;  AVF  . AV FISTULA PLACEMENT Left 11/30/2014   Procedure: ARTERIOVENOUS (AV) FISTULA CREATION LEFT ARM;  Surgeon: Angelia Mould, MD;  Location: [facility];  Service: Vascular;  Laterality: Left;  . AV FISTULA PLACEMENT Left 03/20/2015   Procedure:  Inserton of Left Upper Arm Gortex Graft;  Surgeon: Angelia Mould, MD;  Location: [facility];  Service: Vascular;  Laterality: Left;  . AV FISTULA PLACEMENT Left 04/12/2015   Procedure: INSERTION OF LEFT ARM  ARTERIOVENOUS GORE-TEX GRAFT ;  Surgeon: Angelia Mould, MD;  Location: [facility] [place];  Service: Vascular;  Laterality:  Left;  . BACK SURGERY  ~1980  . CATARACT EXTRACTION Right   . CHOLECYSTECTOMY    . ESOPHAGOGASTRODUODENOSCOPY N/A 10/24/2014   Procedure: ESOPHAGOGASTRODUODENOSCOPY (EGD);  Surgeon: Jerene Bears, MD;  Location: [facility] ENDOSCOPY;  Service: Endoscopy;  Laterality: N/A;  . EXCHANGE OF A DIALYSIS CATHETER Left 11/30/2014   Procedure: EXCHANGE OF A DIALYSIS CATHETER, LEFT INTERNAL JUGULAR;  Surgeon: Angelia Mould, MD;  Location: [facility];  Service: Vascular;  Laterality: Left;  . EYE SURGERY Left    transplant  . FISTULOGRAM N/A 02/12/2015   Procedure: FISTULOGRAM;  Surgeon: Angelia Mould, MD;  Location: [facility] C[facility] LAB;  Service: Cardiovascular;  Laterality: N/A;  . HEMORRHOID SURGERY    . LIGATION OF ARTERIOVENOUS  FISTULA Left 03/20/2015   Procedure: LIGATION OF ARTERIOVENOUS  FISTULA;  Surgeon: Angelia Mould, MD;  Location: [facility];  Service: Vascular;  Laterality: Left;  Marland Kitchen MASTECTOMY Bilateral ~1982   bil breast reconstruction with implants  . ORIF TIBIA & FIBULA FRACTURES Left 2007   also had left non displaced malleolar fracture.   Marland Kitchen  REMOVAL OF GRAFT Left 04/12/2015   Procedure: REMOVAL OF LEFT ARM ARTERIOVENOUS GORE-TEX GRAFT;  Surgeon: Angelia Mould, MD;  Location: [facility];  Service: Vascular;  Laterality: Left;    OB History    No data available       Home Medications    Prior to Admission medications   Medication Sig Start Date End Date Taking? Authorizing Provider  acetaminophen (TYLENOL) 325 MG tablet Take 650 mg by mouth every 6 (six) hours as needed.    [provider]  allopurinol (ZYLOPRIM) 100 MG tablet Take 100 mg by mouth daily.    [provider]  amiodarone (PACERONE) 200 MG tablet Take 200 mg by mouth daily.  06/08/15   [name], MD  aspirin EC 325 MG EC tablet Take 1 tablet (325 mg total) by mouth daily. 02/04/17   Florencia Reasons, MD  B Complex-C-Folic Acid (NEPHRO-VITE PO) Take 1 tablet by mouth at bedtime.     [provider]  calcium carbonate (TUMS - DOSED IN MG ELEMENTAL CALCIUM) 500 MG chewable tablet Chew 1 tablet by mouth 3 (three) times daily with meals.    [provider]  diazepam (VALIUM) 5 MG tablet Take one tablet by mouth every night at bedtime for rest 02/04/17   Gildardo Cranker, DO  dorzolamide-timolol (COSOPT) 22.3-6.8 MG/ML ophthalmic solution Place 1 drop into the left eye 2 (two) times daily. Wait 2-5 minutes when giving two eye meds    [provider]  fluticasone (FLONASE ALLERGY RELIEF) 50 MCG/ACT nasal spray Place 2 sprays into both nostrils daily.    [provider]  Hydrocortisone (GERHARDT'S BUTT CREAM) CREA Apply 1 application topically 3 (three) times daily.    [provider]  Melatonin 5 MG TABS Take 5 mg by mouth at bedtime.     [provider]  metoprolol tartrate (LOPRESSOR) 25 MG tablet Take 0.5 tablets (12.5 mg total) by mouth 2 (two) times daily. Hold if sbp less than 100 or heart rate less than 50, hold on dialysis day. 02/03/17   Florencia Reasons, MD  midodrine (PROAMATINE) 10 MG tablet Take 10 mg by mouth every Monday, Wednesday, and Friday. On dialysis days    [provider]  OXYGEN Inhale 2 L/min into the lungs. 24/7    [provider]  phenazopyridine (PYRIDIUM) 100 MG tablet Take 100 mg by mouth 2 (two) times daily.    [provider]  promethazine (PHENERGAN) 25 MG tablet Take 25 mg by mouth every 8 (eight) hours as needed for nausea or vomiting.    [provider]  saccharomyces boulardii (FLORASTOR) 250 MG capsule Take 250 mg by mouth 2 (two) times daily.    [provider]  traMADol (ULTRAM) 50 MG tablet Take 50 mg by mouth 3 (three) times daily as needed for moderate pain.    [provider]  Vancomycin (VANCOCIN) 750-5 MG/150ML-% SOLN Inject 750 mg into the vein. Every Monday, Wednesday and Friday. Dialysis.    [provider]    Family History Family History    Problem Relation Age of Onset  . Diabetes Mother   . Heart disease Mother   . Non-Hodgkin's lymphoma Mother   . Heart attack Father   . Stroke Father   . Parkinson's disease Brother   . Lung disease Brother   . Heart disease Brother   . Multiple myeloma Sister     Social History Social History   Tobacco Use  . Smoking  status: Never Smoker  . Smokeless tobacco: Never Used  Substance Use Topics  . Alcohol use: No  . Drug use: No     Allergies   Erythromycin; Erythromycin base; Penicillins; Codeine; and Penicillin g   Review of Systems Review of Systems  Musculoskeletal:       Left arm swelling  All other systems reviewed and are negative.    Physical Exam Updated Vital Signs BP (!) 103/53 (BP Location: Right Arm)   Pulse (!) 57   Temp 97.9 F (36.6 C) (Oral)   Resp 16   Ht _0  (1.651 m)   Wt 71.7 kg (158 lb)   SpO2 100%   BMI 26.29 kg/m   Physical Exam  Constitutional: She is oriented to person, place, and time. She appears well-developed and well-nourished.  HENT:  Head: Normocephalic and atraumatic.  Mouth/Throat: Oropharynx is clear and moist.  Eyes: Conjunctivae and EOM are normal. Pupils are equal, round, and reactive to light.  Neck: Normal range of motion.  Cardiovascular: Normal rate, regular rhythm and normal heart sounds.  Pulmonary/Chest: Effort normal and breath sounds normal. No stridor. No respiratory distress.  Abdominal: Soft. Bowel sounds are normal. There is no tenderness. There is no rebound.  Musculoskeletal: Normal range of motion.  Left upper arm is slightly swollen with fair amount of bruising; thrill remains palpable over fistula; there is no bleeding or overlying erythema; warmth to touch noted  Neurological: She is alert and oriented to person, place, and time.  Skin: Skin is warm and dry.  Psychiatric: She has a normal mood and affect.  Nursing note and vitals reviewed.    ED Treatments / Results  Labs (all labs  ordered are listed, but only abnormal results are displayed) Labs Reviewed - No data to display  EKG  EKG Interpretation None       Radiology No results found.  Procedures Procedures (including critical care time)  Medications Ordered in ED Medications - No data to display   Initial Impression / Assessment and Plan / ED Course  I have reviewed the triage vital signs and the nursing notes.  Pertinent labs & imaging results that were available during my care of the patient were reviewed by me and considered in my medical decision making (see chart for details).  81 year old female with end-stage renal disease on hemodialysis, last dialyzed yesterday, presenting to the ED with left upper arm swelling/bruising.  States she has had some pain in the arm today, nothing severe.  Denies any new numbness or weakness.  On exam she does have some swelling and bruising of left upper arm compared with right.  Palpable thrill still felt over the fistula.  There is warmth to touch without erythema, induration, drainage, or bleeding.  Unfortunately, vascular sonography unavailable at this hour to assess for clot.    Attending physician, Dr. Ellender Hose, has performed bedside US with doppler-- seems to have good flow throughout fistula, does appear to be a hematoma over recent access site.  Will discuss with vascular surgery.  9:14 PM Discussed with Dr. Trula Slade-- ok to continue dialysis tomorrow, will just need access at remote site.  If trouble cannulating tomorrow, may need temporary catheter but dialysis center can coordinate that with vascular team tomorrow if needed.  Plan discussed with patient and her grandson, she acknowledged understanding and agreed with plan of care.  Return precautions were given for any new or worsening symptoms.  Final Clinical Impressions(s) / ED Diagnoses   Final diagnoses:  Hematoma    ED Discharge Orders    None       Kathryne Hitch 12/17/17 [id number]     Duffy Bruce, MD 12/18/17 [phone]

## 2017-12-17 NOTE — Discharge Instructions (Signed)
[place] today showed a superficial hematoma, still had good blood flow in the fistula. You can go to dialysis tomorrow, try to have them move your access site away from the bruised area to keep it from getting bigger. They can all the vascular clinic tomorrow if they have any issues with your access tomorrow. Return here for any new/acute changes.

## 2017-12-17 NOTE — ED Triage Notes (Addendum)
Pt BIB EMS from [facility]. Pt c/o swelling, bruising to L arm at fistula site. No problems at dialysis yesterday reported by pt. Per EMS, skin somewhat warm to touch at site. Reports hx of clots at dialysis. Denies falls or trauma. V/S WNL and stable for EMS. Pt on 2L O2 via [place] at facility.

## 2017-12-17 NOTE — ED Notes (Signed)
[facility] CALLED FOR TRANSPORT. 

## 2017-12-18 DIAGNOSIS — N2581 Secondary hyperparathyroidism of renal origin: Secondary | ICD-10-CM | POA: Diagnosis not present

## 2017-12-18 DIAGNOSIS — E1122 Type 2 diabetes mellitus with diabetic chronic kidney disease: Secondary | ICD-10-CM | POA: Diagnosis not present

## 2017-12-18 DIAGNOSIS — N186 End stage renal disease: Secondary | ICD-10-CM | POA: Diagnosis not present

## 2017-12-18 DIAGNOSIS — G8911 Acute pain due to trauma: Secondary | ICD-10-CM | POA: Diagnosis not present

## 2017-12-18 DIAGNOSIS — R609 Edema, unspecified: Secondary | ICD-10-CM | POA: Diagnosis not present

## 2017-12-20 DIAGNOSIS — N2581 Secondary hyperparathyroidism of renal origin: Secondary | ICD-10-CM | POA: Diagnosis not present

## 2017-12-20 DIAGNOSIS — N186 End stage renal disease: Secondary | ICD-10-CM | POA: Diagnosis not present

## 2017-12-20 DIAGNOSIS — E1122 Type 2 diabetes mellitus with diabetic chronic kidney disease: Secondary | ICD-10-CM | POA: Diagnosis not present

## 2017-12-21 DIAGNOSIS — Z992 Dependence on renal dialysis: Secondary | ICD-10-CM | POA: Diagnosis not present

## 2017-12-21 DIAGNOSIS — E1122 Type 2 diabetes mellitus with diabetic chronic kidney disease: Secondary | ICD-10-CM | POA: Diagnosis not present

## 2017-12-21 DIAGNOSIS — N186 End stage renal disease: Secondary | ICD-10-CM | POA: Diagnosis not present

## 2017-12-23 DIAGNOSIS — D631 Anemia in chronic kidney disease: Secondary | ICD-10-CM | POA: Diagnosis not present

## 2017-12-23 DIAGNOSIS — D509 Iron deficiency anemia, unspecified: Secondary | ICD-10-CM | POA: Diagnosis not present

## 2017-12-23 DIAGNOSIS — E1122 Type 2 diabetes mellitus with diabetic chronic kidney disease: Secondary | ICD-10-CM | POA: Diagnosis not present

## 2017-12-23 DIAGNOSIS — N2581 Secondary hyperparathyroidism of renal origin: Secondary | ICD-10-CM | POA: Diagnosis not present

## 2017-12-23 DIAGNOSIS — N186 End stage renal disease: Secondary | ICD-10-CM | POA: Diagnosis not present

## 2017-12-25 ENCOUNTER — Emergency Department (HOSPITAL_COMMUNITY)
Admission: EM | Admit: 2017-12-25 | Discharge: 2017-12-26 | Disposition: A | Discharge status: Home / Self Care | Payer: Medicare Other | Attending: Emergency Medicine | Admitting: Emergency Medicine

## 2017-12-25 ENCOUNTER — Other Ambulatory Visit: Payer: Self-pay

## 2017-12-25 ENCOUNTER — Encounter (HOSPITAL_COMMUNITY): Payer: Self-pay | Admitting: Emergency Medicine

## 2017-12-25 DIAGNOSIS — Z992 Dependence on renal dialysis: Secondary | ICD-10-CM | POA: Insufficient documentation

## 2017-12-25 DIAGNOSIS — Z7982 Long term (current) use of aspirin: Secondary | ICD-10-CM | POA: Insufficient documentation

## 2017-12-25 DIAGNOSIS — Z853 Personal history of malignant neoplasm of breast: Secondary | ICD-10-CM | POA: Diagnosis not present

## 2017-12-25 DIAGNOSIS — N2581 Secondary hyperparathyroidism of renal origin: Secondary | ICD-10-CM | POA: Diagnosis not present

## 2017-12-25 DIAGNOSIS — I5030 Unspecified diastolic (congestive) heart failure: Secondary | ICD-10-CM | POA: Insufficient documentation

## 2017-12-25 DIAGNOSIS — E119 Type 2 diabetes mellitus without complications: Secondary | ICD-10-CM | POA: Insufficient documentation

## 2017-12-25 DIAGNOSIS — N186 End stage renal disease: Secondary | ICD-10-CM | POA: Insufficient documentation

## 2017-12-25 DIAGNOSIS — Z79899 Other long term (current) drug therapy: Secondary | ICD-10-CM | POA: Diagnosis not present

## 2017-12-25 DIAGNOSIS — E1122 Type 2 diabetes mellitus with diabetic chronic kidney disease: Secondary | ICD-10-CM | POA: Diagnosis not present

## 2017-12-25 DIAGNOSIS — I132 Hypertensive heart and chronic kidney disease with heart failure and with stage 5 chronic kidney disease, or end stage renal disease: Secondary | ICD-10-CM | POA: Insufficient documentation

## 2017-12-25 DIAGNOSIS — I120 Hypertensive chronic kidney disease with stage 5 chronic kidney disease or end stage renal disease: Secondary | ICD-10-CM | POA: Diagnosis not present

## 2017-12-25 DIAGNOSIS — Z794 Long term (current) use of insulin: Secondary | ICD-10-CM | POA: Diagnosis not present

## 2017-12-25 DIAGNOSIS — Z452 Encounter for adjustment and management of vascular access device: Secondary | ICD-10-CM | POA: Diagnosis present

## 2017-12-25 DIAGNOSIS — T83498A Other mechanical complication of other prosthetic devices, implants and grafts of genital tract, initial encounter: Secondary | ICD-10-CM | POA: Diagnosis not present

## 2017-12-25 DIAGNOSIS — T82898A Other specified complication of vascular prosthetic devices, implants and grafts, initial encounter: Secondary | ICD-10-CM | POA: Diagnosis not present

## 2017-12-25 DIAGNOSIS — D631 Anemia in chronic kidney disease: Secondary | ICD-10-CM | POA: Diagnosis not present

## 2017-12-25 DIAGNOSIS — D509 Iron deficiency anemia, unspecified: Secondary | ICD-10-CM | POA: Diagnosis not present

## 2017-12-25 NOTE — ED Triage Notes (Signed)
Per EMS, pt from [facility], pt continues to have problems w/ her fistula including red, very warm and general discomfort.  She did have dialysis today at which time they were able to access her fistula and she was able to get a complete session.  Pt is bed bound and on 2L of O2 continuously.

## 2017-12-26 DIAGNOSIS — N186 End stage renal disease: Secondary | ICD-10-CM | POA: Diagnosis not present

## 2017-12-26 DIAGNOSIS — N179 Acute kidney failure, unspecified: Secondary | ICD-10-CM | POA: Diagnosis not present

## 2017-12-26 DIAGNOSIS — M25139 Fistula, unspecified wrist: Secondary | ICD-10-CM | POA: Diagnosis not present

## 2017-12-26 LAB — CBC WITH DIFFERENTIAL/PLATELET
Basophils Absolute: 0.1 10*3/uL (ref 0.0–0.1)
Basophils Relative: 1 %
EOS ABS: 0.3 10*3/uL (ref 0.0–0.7)
EOS PCT: 3 %
HCT: 35.1 % — ABNORMAL LOW (ref 36.0–46.0)
Hemoglobin: 10.7 g/dL — ABNORMAL LOW (ref 12.0–15.0)
LYMPHS ABS: 3.4 10*3/uL (ref 0.7–4.0)
Lymphocytes Relative: 35 %
MCH: 31.7 pg (ref 26.0–34.0)
MCHC: 30.5 g/dL (ref 30.0–36.0)
MCV: 103.8 fL — ABNORMAL HIGH (ref 78.0–100.0)
Monocytes Absolute: 0.9 10*3/uL (ref 0.1–1.0)
Monocytes Relative: 9 %
Neutro Abs: 5.1 10*3/uL (ref 1.7–7.7)
Neutrophils Relative %: 52 %
PLATELETS: 229 10*3/uL (ref 150–400)
RBC: 3.38 MIL/uL — AB (ref 3.87–5.11)
RDW: 17.4 % — AB (ref 11.5–15.5)
WBC: 9.7 10*3/uL (ref 4.0–10.5)

## 2017-12-26 LAB — BASIC METABOLIC PANEL
Anion gap: 9 (ref 5–15)
BUN: 20 mg/dL (ref 6–20)
CALCIUM: 8.4 mg/dL — AB (ref 8.9–10.3)
CO2: 34 mmol/L — ABNORMAL HIGH (ref 22–32)
CREATININE: 2.47 mg/dL — AB (ref 0.44–1.00)
Chloride: 95 mmol/L — ABNORMAL LOW (ref 101–111)
GFR calc Af Amer: 20 mL/min — ABNORMAL LOW (ref >60)
GFR, EST NON AFRICAN AMERICAN: 17 mL/min — AB (ref >60)
Glucose, Bld: 155 mg/dL — ABNORMAL HIGH (ref 65–99)
POTASSIUM: 3.4 mmol/L — AB (ref 3.5–5.1)
SODIUM: 138 mmol/L (ref 135–145)

## 2017-12-26 LAB — I-STAT CG4 LACTIC ACID, ED: LACTIC ACID, VENOUS: 1.29 mmol/L (ref 0.5–1.9)

## 2017-12-26 MED ORDER — DOXYCYCLINE HYCLATE 50 MG PO CAPS: 50.0000 mg | ORAL_CAPSULE | Freq: Two times a day (BID) | ORAL | 0 refills | Status: DC

## 2017-12-26 NOTE — ED Notes (Signed)
ED Provider at bedside. 

## 2017-12-26 NOTE — ED Provider Notes (Signed)
[facility] EMERGENCY DEPARTMENT Provider Note   CSN: [id number] Arrival date & time: 12/25/17  2221     History   Chief Complaint Chief Complaint  Patient presents with  . Vascular Access Problem    HPI [name] James is a 82 y.o. female.  Patient presents to the emergency department for evaluation of swelling around her dialysis fistula.  Patient has been seen previously for this.  Patient reports that over the last week or so she has noticed swelling of the left upper arm in conjunction with her fistula.  She reports some mild pain.  There has not been any drainage.  She thinks that the area feels warm to the touch.  She went to dialysis today, they did not have any problem accessing her.  Patient did not mention this to her dialysis nurse today because "it was a new nurse".      Past Medical History:  Diagnosis Date  . Anemia, chronic disease   . Anxiety   . Arthritis    knees  . Atrial fibrillation ([place])   . Barrett esophagus   . Benign paroxysmal positional vertigo   . Breast cancer ([place]) 1980s   bil mastectomies, no radiation or chemo  . Cataract   . Chest pain, atypical 12/05/2008   R/Lmv- normal perfusion all regions, noe ECG changes   . CHF (congestive heart failure) ([place]) 05/13/2011   echo - QA>83%; stage 1 diastolic dysfunction; elevated LV filling pressure, MAC  . CKD (chronic kidney disease), stage III ([place])   . Claudication ([place]) 10/30/2005   doppler - normal evaluation, no evidence of aneurysm, diameter reduction, dissection, compression or vascular abnormality)  . Complication of anesthesia   . Dyslipidemia   . Dysrhythmia    Atrial Fibrillation  . Edema   . ESRD (end stage renal disease) ([place])   . Family history of adverse reaction to anesthesia    son also has nausea   . Fever blister 12/2016  . Gastritis and gastroduodenitis   . GERD (gastroesophageal reflux disease)   . Gout   . Hemodialysis-associated hypotension   . HLD  (hyperlipidemia)   . Hypertension   . IBS (irritable bowel syndrome)   . Insomnia   . Morbid obesity ([place])   . PAF (paroxysmal atrial fibrillation) ([place])   . Peripheral neuropathy   . PONV (postoperative nausea and vomiting)   . Renal cyst   . Renal insufficiency 10/30/2005   doppler - abn resistance consistent w/ parenchymal disease  . Renovascular hypertension   . Type 2 diabetes mellitus with diabetic neuropathy, with long-term current use of insulin ([place])   . Vertigo     Patient Active Problem List   Diagnosis Date Noted  . Pneumothorax, left 10/13/2017  . Multiple lung nodules on CT 10/06/2017  . Pleural effusion, left 10/06/2017  . Diabetic polyneuropathy associated with type 2 diabetes mellitus ([place]) 07/30/2017  . Onychomycosis of toenail 07/30/2017  . Idiopathic chronic venous hypertension of both lower extremities with inflammation 07/30/2017  . Chronic respiratory failure with hypoxia ([place]) 04/29/2017  . Morbid (severe) obesity due to excess calories ([place]) 04/29/2017  . LFT elevation   . Advance care planning   . Goals of care, counseling/discussion   . Palliative care by specialist   . Pressure ulcer 01/19/2017  . Long term current use of amiodarone 10/17/2016  . History of pressure ulcer 10/17/2016  . GAD (generalized anxiety disorder) 10/17/2016  . Osteopenia determined by x-ray 04/17/2016  .  Controlled type 2 diabetes mellitus with diabetic polyneuropathy, with long-term current use of insulin ([place]) 04/17/2016  . Type 2 diabetes mellitus with diabetic neuropathy ([place]) 06/08/2015  . Type 2 diabetes, controlled, with renal manifestation ([place]) 06/08/2015  . PVD (peripheral vascular disease) ([place]) 06/08/2015  . Swelling of limb-Left arm 04/10/2015  . Nausea with vomiting 01/12/2015  . AP (abdominal pain) 01/12/2015  . Normocytic anemia 01/12/2015  . Paroxysmal atrial fibrillation ([place]) 12/22/2014  . End stage renal disease ([place])   . Melena   . ESRD on dialysis  ([place]) 12/19/2014  . CHF (congestive heart failure) ([place]) 12/19/2014  . Palpitation 12/19/2014  . ESRD (end stage renal disease) ([place]) 12/19/2014  . Anemia, chronic disease 12/13/2014  . Hemodialysis-associated hypotension 12/13/2014  . Pulmonary edema 11/10/2014  . Physical deconditioning 10/29/2014  . Iron deficiency anemia   . Gastritis and gastroduodenitis 10/24/2014  . Heme positive stool 10/24/2014  . Morbid obesity ([place]) 10/18/2014  . Edema 10/18/2014  . Essential hypertension 10/17/2014  . CKD (chronic kidney disease), stage III ([place]) 10/17/2014  . DM type 2, uncontrolled, with renal complications ([place]) 16/09/9603  . Dyslipidemia 10/17/2014  . Anemia 10/17/2014    Past Surgical History:  Procedure Laterality Date  . ANGIOPLASTY Left 02/12/2015   Procedure: ANGIOPLASTY;  Surgeon: Angelia Mould, MD;  Location: [facility] CATH LAB;  Service: Cardiovascular;  Laterality: Left;  AVF  . AV FISTULA PLACEMENT Left 11/30/2014   Procedure: ARTERIOVENOUS (AV) FISTULA CREATION LEFT ARM;  Surgeon: Angelia Mould, MD;  Location: [facility];  Service: Vascular;  Laterality: Left;  . AV FISTULA PLACEMENT Left 03/20/2015   Procedure:  Inserton of Left Upper Arm Gortex Graft;  Surgeon: Angelia Mould, MD;  Location: [facility];  Service: Vascular;  Laterality: Left;  . AV FISTULA PLACEMENT Left 04/12/2015   Procedure: INSERTION OF LEFT ARM  ARTERIOVENOUS GORE-TEX GRAFT ;  Surgeon: Angelia Mould, MD;  Location: [facility];  Service: Vascular;  Laterality: Left;  . BACK SURGERY  ~1980  . CATARACT EXTRACTION Right   . CHOLECYSTECTOMY    . ESOPHAGOGASTRODUODENOSCOPY N/A 10/24/2014   Procedure: ESOPHAGOGASTRODUODENOSCOPY (EGD);  Surgeon: Jerene Bears, MD;  Location[facility] ENDOSCOPY;  Service: Endoscopy;  Laterality: N/A;  . EXCHANGE OF A DIALYSIS CATHETER Left 11/30/2014   Procedure: EXCHANGE OF A DIALYSIS CATHETER, LEFT INTERNAL JUGULAR;  Surgeon: Angelia Mould, MD;  Location: [facility];   Service: Vascular;  Laterality: Left;  . EYE SURGERY Left    transplant  . FISTULOGRAM N/A 02/12/2015   Procedure: FISTULOGRAM;  Surgeon: Angelia Mould, MD;  Location[facility] CATH LAB;  Service: Cardiovascular;  Laterality: N/A;  . HEMORRHOID SURGERY    . LIGATION OF ARTERIOVENOUS  FISTULA Left 03/20/2015   Procedure: LIGATION OF ARTERIOVENOUS  FISTULA;  Surgeon: Angelia Mould, MD;  Location: [facility];  Service: Vascular;  Laterality: Left;  Marland Kitchen MASTECTOMY Bilateral ~1982   bil breast reconstruction with implants  . ORIF TIBIA & FIBULA FRACTURES Left 2007   also had left non displaced malleolar fracture.   Marland Kitchen REMOVAL OF GRAFT Left 04/12/2015   Procedure: REMOVAL OF LEFT ARM ARTERIOVENOUS GORE-TEX GRAFT;  Surgeon: Angelia Mould, MD;  Location: [facility];  Service: Vascular;  Laterality: Left;    OB History    No data available       Home Medications    Prior to Admission medications   Medication Sig Start Date End Date Taking? Authorizing Provider  acetaminophen (TYLENOL) 325 MG  tablet Take 650 mg by mouth every 6 (six) hours as needed.    [provider]  allopurinol (ZYLOPRIM) 100 MG tablet Take 100 mg by mouth daily.    [provider]  amiodarone (PACERONE) 200 MG tablet Take 200 mg by mouth daily.  06/08/15   [name], MD  aspirin EC 325 MG EC tablet Take 1 tablet (325 mg total) by mouth daily. Patient not taking: Reported on 12/17/2017 02/04/17   Florencia Reasons, MD  aspirin EC 81 MG tablet Take 81 mg by mouth daily.    [provider]  B Complex-C-Folic Acid (NEPHRO-VITE PO) Take 1 tablet by mouth at bedtime.     [provider]  calcium acetate (PHOSLO) 667 MG capsule Take 1,334 mg by mouth 3 (three) times daily with meals.    [provider]  calcium carbonate (TUMS - DOSED IN MG ELEMENTAL CALCIUM) 500 MG chewable tablet Chew 1 tablet by mouth 3 (three) times daily with meals.    [provider]  Cranberry 500 MG  CAPS Take 1 capsule by mouth 2 (two) times daily.    [provider]  diazepam (VALIUM) 5 MG tablet Take one tablet by mouth every night at bedtime for rest 02/04/17   Gildardo Cranker, DO  dorzolamide-timolol (COSOPT) 22.3-6.8 MG/ML ophthalmic solution Place 1 drop into the left eye 2 (two) times daily. Wait 2-5 minutes when giving two eye meds    [provider]  doxycycline (VIBRAMYCIN) 50 MG capsule Take 1 capsule (50 mg total) by mouth 2 (two) times daily. 12/26/17   Orpah Greek, MD  fluticasone (FLONASE ALLERGY RELIEF) 50 MCG/ACT nasal spray Place 2 sprays into both nostrils daily.    [provider]  Hydrocortisone (GERHARDT'S BUTT CREAM) CREA Apply 1 application topically 3 (three) times daily.    [provider]  loperamide (IMODIUM A-D) 2 MG tablet Take 2 mg by mouth 3 (three) times daily as needed for diarrhea or loose stools.    [provider]  Melatonin 5 MG TABS Take 5 mg by mouth at bedtime.     [provider]  metoprolol tartrate (LOPRESSOR) 25 MG tablet Take 0.5 tablets (12.5 mg total) by mouth 2 (two) times daily. Hold if sbp less than 100 or heart rate less than 50, hold on dialysis day. 02/03/17   Florencia Reasons, MD  midodrine (PROAMATINE) 10 MG tablet Take 10 mg by mouth See admin instructions. 10 mg by mouth daily QD. Medication to be given to resident to take while on dialysis  On Monday, Wednesday and Friday    [provider]  OXYGEN Inhale 2 L/min into the lungs. 24/7    [provider]  promethazine (PHENERGAN) 25 MG tablet Take 25 mg by mouth every 8 (eight) hours as needed for nausea or vomiting.    [provider]  traMADol (ULTRAM) 50 MG tablet Take 50 mg by mouth 3 (three) times daily as needed for moderate pain.    [provider]    Family History Family History  Problem Relation Age of Onset  . Diabetes Mother   . Heart disease Mother   . Non-Hodgkin's lymphoma Mother     . Heart attack Father   . Stroke Father   . Parkinson's disease Brother   . Lung disease Brother   . Heart disease Brother   . Multiple myeloma Sister     Social History Social History   Tobacco Use  . Smoking  status: Never Smoker  . Smokeless tobacco: Never Used  Substance Use Topics  . Alcohol use: No  . Drug use: No     Allergies   Erythromycin; Erythromycin base; Penicillins; Codeine; and Penicillin g   Review of Systems Review of Systems  Constitutional: Negative for fever.  Skin: Negative for color change and wound.  All other systems reviewed and are negative.    Physical Exam Updated Vital Signs BP 137/70 (BP Location: Right Arm)   Pulse 65   Temp 98.3 F (36.8 C) (Oral)   Resp 15   Ht _0  (1.651 m)   Wt 71.7 kg (158 lb)   SpO2 100%   BMI 26.29 kg/m   Physical Exam  Constitutional: She is oriented to person, place, and time. She appears well-developed and well-nourished. No distress.  HENT:  Head: Normocephalic and atraumatic.  Right Ear: Hearing normal.  Left Ear: Hearing normal.  Nose: Nose normal.  Mouth/Throat: Oropharynx is clear and moist and mucous membranes are normal.  Eyes: Conjunctivae and EOM are normal. Pupils are equal, round, and reactive to light.  Neck: Normal range of motion. Neck supple.  Cardiovascular: Regular rhythm, S1 normal and S2 normal. Exam reveals no gallop and no friction rub.  No murmur heard. Pulmonary/Chest: Effort normal and breath sounds normal. No respiratory distress. She exhibits no tenderness.  Abdominal: Soft. Normal appearance and bowel sounds are normal. There is no hepatosplenomegaly. There is no tenderness. There is no rebound, no guarding, no tenderness at McBurney's point and negative Murphy's sign. No hernia.  Musculoskeletal: Normal range of motion.  Left upper extremity with fistula in the upper arm.  Overlying dialysis puncture sites are identified, no erythema or drainage.  The entire length of  the fistula has a positive thrill and is nontender.  There is no fluctuance or induration.  There is some diffuse mild edema noted around the left upper arm.  Neurological: She is alert and oriented to person, place, and time. She has normal strength. No cranial nerve deficit or sensory deficit. Coordination normal. GCS eye subscore is 4. GCS verbal subscore is 5. GCS motor subscore is 6.  Skin: Skin is warm, dry and intact. No rash noted. No cyanosis.  Psychiatric: She has a normal mood and affect. Her speech is normal and behavior is normal. Thought content normal.  Nursing note and vitals reviewed.    ED Treatments / Results  Labs (all labs ordered are listed, but only abnormal results are displayed) Labs Reviewed  CBC WITH DIFFERENTIAL/PLATELET - Abnormal; Notable for the following components:      Result Value   RBC 3.38 (*)    Hemoglobin 10.7 (*)    HCT 35.1 (*)    MCV 103.8 (*)    RDW 17.4 (*)    All other components within normal limits  BASIC METABOLIC PANEL - Abnormal; Notable for the following components:   Potassium 3.4 (*)    Chloride 95 (*)    CO2 34 (*)    Glucose, Bld 155 (*)    Creatinine, Ser 2.47 (*)    Calcium 8.4 (*)    GFR calc non Af Amer 17 (*)    GFR calc Af Amer 20 (*)    All other components within normal limits  I-STAT CG4 LACTIC ACID, ED    EKG  EKG Interpretation None       Radiology No results found.  Procedures Procedures (including critical care time)  Medications Ordered in ED Medications -  No data to display   Initial Impression / Assessment and Plan / ED Course  I have reviewed the triage vital signs and the nursing notes.  Pertinent labs & imaging results that were available during my care of the patient were reviewed by me and considered in my medical decision making (see chart for details).     Patient presents to the emergency department for evaluation of swelling of her left upper arm.  Patient has a dialysis fistula in  the area.  There does not appear to be any problem with the fistula itself.  She did complete dialysis today without any difficulty cannulating and she reports that there was good blood flow.  There is an excellent palpable thrill throughout the length of the fistula.  There is some diffuse slightly pitting edema of the left upper arm, but there is no erythema, warmth, induration, fluctuance or anything that looks suspicious for abscess or significant infection.  Patient wants Dr. Scot Dock to come into the emergency department tonight to see her.  I informed her that she does not appear to have a vascular emergency, he would not come to the ER in the middle of the night.  Reviewing records from previous visit reveals that a discussion with Dr. Trula Slade resulted in recommendation that she be seen in the office or have this evaluated at dialysis.  She was once again counseled that this can be followed up as an outpatient, does not need to be operated on currently and is therefore not a vascular surgery emergency.  She will empirically be treated with antibiotics and a slight pressure dressing will be placed over the area.  Final Clinical Impressions(s) / ED Diagnoses   Final diagnoses:  ESRD on dialysis [facility])    ED Discharge Orders        Ordered    doxycycline (VIBRAMYCIN) 50 MG capsule  2 times daily     12/26/17 0239       Orpah Greek, MD 12/26/17 [phone]

## 2017-12-26 NOTE — ED Notes (Signed)
Signature pad unavailable at time of departure. Pt verbalized understanding of prescriptions and instructions.

## 2017-12-28 DIAGNOSIS — D509 Iron deficiency anemia, unspecified: Secondary | ICD-10-CM | POA: Diagnosis not present

## 2017-12-28 DIAGNOSIS — D631 Anemia in chronic kidney disease: Secondary | ICD-10-CM | POA: Diagnosis not present

## 2017-12-28 DIAGNOSIS — N186 End stage renal disease: Secondary | ICD-10-CM | POA: Diagnosis not present

## 2017-12-28 DIAGNOSIS — E1122 Type 2 diabetes mellitus with diabetic chronic kidney disease: Secondary | ICD-10-CM | POA: Diagnosis not present

## 2017-12-28 DIAGNOSIS — N2581 Secondary hyperparathyroidism of renal origin: Secondary | ICD-10-CM | POA: Diagnosis not present

## 2017-12-30 DIAGNOSIS — D509 Iron deficiency anemia, unspecified: Secondary | ICD-10-CM | POA: Diagnosis not present

## 2017-12-30 DIAGNOSIS — E1122 Type 2 diabetes mellitus with diabetic chronic kidney disease: Secondary | ICD-10-CM | POA: Diagnosis not present

## 2017-12-30 DIAGNOSIS — N2581 Secondary hyperparathyroidism of renal origin: Secondary | ICD-10-CM | POA: Diagnosis not present

## 2017-12-30 DIAGNOSIS — N186 End stage renal disease: Secondary | ICD-10-CM | POA: Diagnosis not present

## 2017-12-30 DIAGNOSIS — D631 Anemia in chronic kidney disease: Secondary | ICD-10-CM | POA: Diagnosis not present

## 2018-01-01 DIAGNOSIS — N2581 Secondary hyperparathyroidism of renal origin: Secondary | ICD-10-CM | POA: Diagnosis not present

## 2018-01-01 DIAGNOSIS — D631 Anemia in chronic kidney disease: Secondary | ICD-10-CM | POA: Diagnosis not present

## 2018-01-01 DIAGNOSIS — D509 Iron deficiency anemia, unspecified: Secondary | ICD-10-CM | POA: Diagnosis not present

## 2018-01-01 DIAGNOSIS — E1122 Type 2 diabetes mellitus with diabetic chronic kidney disease: Secondary | ICD-10-CM | POA: Diagnosis not present

## 2018-01-01 DIAGNOSIS — N186 End stage renal disease: Secondary | ICD-10-CM | POA: Diagnosis not present

## 2018-01-01 DIAGNOSIS — L03114 Cellulitis of left upper limb: Secondary | ICD-10-CM | POA: Diagnosis not present

## 2018-01-04 DIAGNOSIS — N2581 Secondary hyperparathyroidism of renal origin: Secondary | ICD-10-CM | POA: Diagnosis not present

## 2018-01-04 DIAGNOSIS — D509 Iron deficiency anemia, unspecified: Secondary | ICD-10-CM | POA: Diagnosis not present

## 2018-01-04 DIAGNOSIS — N186 End stage renal disease: Secondary | ICD-10-CM | POA: Diagnosis not present

## 2018-01-04 DIAGNOSIS — D631 Anemia in chronic kidney disease: Secondary | ICD-10-CM | POA: Diagnosis not present

## 2018-01-04 DIAGNOSIS — E1122 Type 2 diabetes mellitus with diabetic chronic kidney disease: Secondary | ICD-10-CM | POA: Diagnosis not present

## 2018-01-05 ENCOUNTER — Ambulatory Visit (HOSPITAL_COMMUNITY)
Admission: RE | Admit: 2018-01-05 | Discharge: 2018-01-05 | Disposition: A | Discharge status: Home / Self Care | Payer: Medicare Other | Source: Ambulatory Visit | Attending: Adult Health | Admitting: Adult Health

## 2018-01-05 DIAGNOSIS — J439 Emphysema, unspecified: Secondary | ICD-10-CM | POA: Diagnosis not present

## 2018-01-05 DIAGNOSIS — R918 Other nonspecific abnormal finding of lung field: Secondary | ICD-10-CM | POA: Diagnosis not present

## 2018-01-05 DIAGNOSIS — I700 Atherosclerosis of aorta: Secondary | ICD-10-CM | POA: Diagnosis not present

## 2018-01-05 DIAGNOSIS — J90 Pleural effusion, not elsewhere classified: Secondary | ICD-10-CM | POA: Insufficient documentation

## 2018-01-06 ENCOUNTER — Other Ambulatory Visit: Payer: Medicare Other

## 2018-01-06 DIAGNOSIS — E1122 Type 2 diabetes mellitus with diabetic chronic kidney disease: Secondary | ICD-10-CM | POA: Diagnosis not present

## 2018-01-06 DIAGNOSIS — D509 Iron deficiency anemia, unspecified: Secondary | ICD-10-CM | POA: Diagnosis not present

## 2018-01-06 DIAGNOSIS — D631 Anemia in chronic kidney disease: Secondary | ICD-10-CM | POA: Diagnosis not present

## 2018-01-06 DIAGNOSIS — N2581 Secondary hyperparathyroidism of renal origin: Secondary | ICD-10-CM | POA: Diagnosis not present

## 2018-01-06 DIAGNOSIS — N186 End stage renal disease: Secondary | ICD-10-CM | POA: Diagnosis not present

## 2018-01-08 DIAGNOSIS — E1122 Type 2 diabetes mellitus with diabetic chronic kidney disease: Secondary | ICD-10-CM | POA: Diagnosis not present

## 2018-01-08 DIAGNOSIS — N2581 Secondary hyperparathyroidism of renal origin: Secondary | ICD-10-CM | POA: Diagnosis not present

## 2018-01-08 DIAGNOSIS — D509 Iron deficiency anemia, unspecified: Secondary | ICD-10-CM | POA: Diagnosis not present

## 2018-01-08 DIAGNOSIS — N186 End stage renal disease: Secondary | ICD-10-CM | POA: Diagnosis not present

## 2018-01-08 DIAGNOSIS — D631 Anemia in chronic kidney disease: Secondary | ICD-10-CM | POA: Diagnosis not present

## 2018-01-11 DIAGNOSIS — N2581 Secondary hyperparathyroidism of renal origin: Secondary | ICD-10-CM | POA: Diagnosis not present

## 2018-01-11 DIAGNOSIS — E1122 Type 2 diabetes mellitus with diabetic chronic kidney disease: Secondary | ICD-10-CM | POA: Diagnosis not present

## 2018-01-11 DIAGNOSIS — D631 Anemia in chronic kidney disease: Secondary | ICD-10-CM | POA: Diagnosis not present

## 2018-01-11 DIAGNOSIS — D509 Iron deficiency anemia, unspecified: Secondary | ICD-10-CM | POA: Diagnosis not present

## 2018-01-11 DIAGNOSIS — N186 End stage renal disease: Secondary | ICD-10-CM | POA: Diagnosis not present

## 2018-01-13 DIAGNOSIS — D509 Iron deficiency anemia, unspecified: Secondary | ICD-10-CM | POA: Diagnosis not present

## 2018-01-13 DIAGNOSIS — D631 Anemia in chronic kidney disease: Secondary | ICD-10-CM | POA: Diagnosis not present

## 2018-01-13 DIAGNOSIS — N2581 Secondary hyperparathyroidism of renal origin: Secondary | ICD-10-CM | POA: Diagnosis not present

## 2018-01-13 DIAGNOSIS — N186 End stage renal disease: Secondary | ICD-10-CM | POA: Diagnosis not present

## 2018-01-13 DIAGNOSIS — E1122 Type 2 diabetes mellitus with diabetic chronic kidney disease: Secondary | ICD-10-CM | POA: Diagnosis not present

## 2018-01-14 ENCOUNTER — Ambulatory Visit (INDEPENDENT_AMBULATORY_CARE_PROVIDER_SITE_OTHER): Payer: Medicare Other | Admitting: Family

## 2018-01-14 ENCOUNTER — Other Ambulatory Visit: Payer: Self-pay | Admitting: *Deleted

## 2018-01-14 ENCOUNTER — Encounter: Payer: Self-pay | Admitting: *Deleted

## 2018-01-14 ENCOUNTER — Encounter: Payer: Self-pay | Admitting: Family

## 2018-01-14 VITALS — BP 109/60 | HR 64 | Temp 97.6°F | Resp 18 | Ht 65.0 in | Wt 158.0 lb

## 2018-01-14 DIAGNOSIS — N186 End stage renal disease: Secondary | ICD-10-CM

## 2018-01-14 DIAGNOSIS — T82590D Other mechanical complication of surgically created arteriovenous fistula, subsequent encounter: Secondary | ICD-10-CM

## 2018-01-14 DIAGNOSIS — Z992 Dependence on renal dialysis: Secondary | ICD-10-CM | POA: Diagnosis not present

## 2018-01-14 DIAGNOSIS — M7989 Other specified soft tissue disorders: Secondary | ICD-10-CM | POA: Diagnosis not present

## 2018-01-14 NOTE — H&P (View-Only) (Signed)
CC: Swelling left arm x 2 weeks, has left upper arm AV graft  History of Present Illness  [name] James is a 82 y.o. (1936-10-18) female who is s/p insertion of left upper arm AV graft and removal of old forearm graft on 04/12/15 by Dr. Scot Dock. Shehad a previous left radiocephalic fistula which failed. She then had a forearm graft. The brachial vein was fairly small and this clotted fairly early. Therefore Dr. Leodis Sias to place a new left upper arm graft.  She returns today with c/o swelling of left arm x 2 weeks. She presented to the ED on 12-25-17 and 12-17-17 with same concern.   She returned on 10-08-16 with report of hardened area at center of graft that was limiting area of graft that can be used. Area was painful, occasionally warm to touch.  Dr. Scot Dock reviewed results of dialysis access duplex performed that day.  Mild dilatationwas noted in the mid segment, stenosis was not significant.  Advised that the two areas being accessed be avoided, to access other areas of the graft. If pt continued to have problems with the graft, she would need to make an appointment with Dr. Scot Dock to discuss creation of another access site.  Pt returned (09-29-17) as she expressing frustration with having to go to [facility] about every month to check her AV graft for problems during HD, and she misses her HD appointment. Daughter stated that pt does fine for a while in HD, then has access issues or bleeding issues in HD. She had recently had a procedure at [facility] to open up a narrow part of the AV graft. Daughter and pt want to know if it is now worth it to look for another access.   She dialyzes M-W-F via the left upper arm AV graft at [facility].  Dr. Lyda Kalata is pt's nephrologist.  Daughter states that pt has had several dilatations of the left upper arm access by the nephrology group.   Pt has a hx of GI bleed in December, 2015. She was given IV heparin at that time for  atrial fib, pt had rectal bleeding, sees Dr. Hilarie Fredrickson.   She is on a single agent antiplatelet medication and no anticoagulants.   The patient is right hand dominant.  Pt states that her left hand stays cold.   Pt is in [facility] facility since she is unable to walk and care for her self adequately. The inability to walk started when she had renal failure and CHF about 2015.    She is to be scheduled for Ir thoracentesis for aspiration pleural space, has pleural effusion and lung nodule.    Past Medical History:  Diagnosis Date  . Anemia, chronic disease   . Anxiety   . Arthritis    knees  . Atrial fibrillation ([place])   . Barrett esophagus   . Benign paroxysmal positional vertigo   . Breast cancer ([place]) 1980s   bil mastectomies, no radiation or chemo  . Cataract   . Chest pain, atypical 12/05/2008   R/Lmv- normal perfusion all regions, noe ECG changes   . CHF (congestive heart failure) ([place]) 05/13/2011   echo - ZO>10%; stage 1 diastolic dysfunction; elevated LV filling pressure, MAC  . CKD (chronic kidney disease), stage III ([place])   . Claudication ([place]) 10/30/2005   doppler - normal evaluation, no evidence of aneurysm, diameter reduction, dissection, compression or vascular abnormality)  . Complication of anesthesia   . Dyslipidemia   .  Dysrhythmia    Atrial Fibrillation  . Edema   . ESRD (end stage renal disease) ([place])   . Family history of adverse reaction to anesthesia    son also has nausea   . Fever blister 12/2016  . Gastritis and gastroduodenitis   . GERD (gastroesophageal reflux disease)   . Gout   . Hemodialysis-associated hypotension   . HLD (hyperlipidemia)   . Hypertension   . IBS (irritable bowel syndrome)   . Insomnia   . Morbid obesity ([place])   . PAF (paroxysmal atrial fibrillation) ([place])   . Peripheral neuropathy   . PONV (postoperative nausea and vomiting)   . Renal cyst   . Renal insufficiency 10/30/2005   doppler - abn resistance  consistent w/ parenchymal disease  . Renovascular hypertension   . Type 2 diabetes mellitus with diabetic neuropathy, with long-term current use of insulin ([place])   . Vertigo     Social History Social History   Tobacco Use  . Smoking status: Never Smoker  . Smokeless tobacco: Never Used  Substance Use Topics  . Alcohol use: No  . Drug use: No    Family History Family History  Problem Relation Age of Onset  . Diabetes Mother   . Heart disease Mother   . Non-Hodgkin's lymphoma Mother   . Heart attack Father   . Stroke Father   . Parkinson's disease Brother   . Lung disease Brother   . Heart disease Brother   . Multiple myeloma Sister     Surgical History Past Surgical History:  Procedure Laterality Date  . ANGIOPLASTY Left 02/12/2015   Procedure: ANGIOPLASTY;  Surgeon: Angelia Mould, MD;  Location: [facility] CATH LAB;  Service: Cardiovascular;  Laterality: Left;  AVF  . AV FISTULA PLACEMENT Left 11/30/2014   Procedure: ARTERIOVENOUS (AV) FISTULA CREATION LEFT ARM;  Surgeon: Angelia Mould, MD;  Location: [place];  Service: Vascular;  Laterality: Left;  . AV FISTULA PLACEMENT Left 03/20/2015   Procedure:  Inserton of Left Upper Arm Gortex Graft;  Surgeon: Angelia Mould, MD;  Location: [place];  Service: Vascular;  Laterality: Left;  . AV FISTULA PLACEMENT Left 04/12/2015   Procedure: INSERTION OF LEFT ARM  ARTERIOVENOUS GORE-TEX GRAFT ;  Surgeon: Angelia Mould, MD;  Location: [place];  Service: Vascular;  Laterality: Left;  . BACK SURGERY  ~1980  . CATARACT EXTRACTION Right   . CHOLECYSTECTOMY    . ESOPHAGOGASTRODUODENOSCOPY N/A 10/24/2014   Procedure: ESOPHAGOGASTRODUODENOSCOPY (EGD);  Surgeon: Jerene Bears, MD;  Location: [facility] ENDOSCOPY;  Service: Endoscopy;  Laterality: N/A;  . EXCHANGE OF A DIALYSIS CATHETER Left 11/30/2014   Procedure: EXCHANGE OF A DIALYSIS CATHETER, LEFT INTERNAL JUGULAR;  Surgeon: Angelia Mould, MD;  Location: [place];  Service:  Vascular;  Laterality: Left;  . EYE SURGERY Left    transplant  . FISTULOGRAM N/A 02/12/2015   Procedure: FISTULOGRAM;  Surgeon: Angelia Mould, MD;  Location: [facility] CATH LAB;  Service: Cardiovascular;  Laterality: N/A;  . HEMORRHOID SURGERY    . LIGATION OF ARTERIOVENOUS  FISTULA Left 03/20/2015   Procedure: LIGATION OF ARTERIOVENOUS  FISTULA;  Surgeon: Angelia Mould, MD;  Location: [place];  Service: Vascular;  Laterality: Left;  Marland Kitchen MASTECTOMY Bilateral ~1982   bil breast reconstruction with implants  . ORIF TIBIA & FIBULA FRACTURES Left 2007   also had left non displaced malleolar fracture.   Marland Kitchen REMOVAL OF GRAFT Left 04/12/2015   Procedure: REMOVAL OF LEFT ARM ARTERIOVENOUS  GORE-TEX GRAFT;  Surgeon: Angelia Mould, MD;  Location: [place];  Service: Vascular;  Laterality: Left;    Allergies  Allergen Reactions  . Erythromycin Swelling  . Erythromycin Base     Other reaction(s): GI Upset (intolerance)  . Penicillins Other (See Comments)    Unknown allergic reaction.  She has taken keflex and Rocephin many times without problems  . Codeine Rash and Palpitations    Other reaction(s): GI Upset (intolerance)  . Penicillin G Rash    Current Outpatient Medications  Medication Sig Dispense Refill  . B Complex-C-Folic Acid (NEPHRO-VITE PO) Take 1 tablet by mouth at bedtime.     . diazepam (VALIUM) 5 MG tablet Take one tablet by mouth every night at bedtime for rest 30 tablet 0  . dorzolamide-timolol (COSOPT) 22.3-6.8 MG/ML ophthalmic solution Place 1 drop into the left eye 2 (two) times daily. Wait 2-5 minutes when giving two eye meds    . fluticasone (FLONASE ALLERGY RELIEF) 50 MCG/ACT nasal spray Place 2 sprays into both nostrils daily.    . hydroxypropyl methylcellulose / hypromellose (ISOPTO TEARS / GONIOVISC) 2.5 % ophthalmic solution Place 1 drop into both eyes 3 (three) times daily as needed for dry eyes.    Marland Kitchen loperamide (IMODIUM A-D) 2 MG tablet Take 2 mg by mouth 3  (three) times daily as needed for diarrhea or loose stools.    . OXYGEN Inhale 2 L/min into the lungs. 24/7    . acetaminophen (TYLENOL) 325 MG tablet Take 650 mg by mouth every 6 (six) hours as needed for mild pain or fever.     Marland Kitchen allopurinol (ZYLOPRIM) 100 MG tablet Take 100 mg by mouth daily.    Marland Kitchen amiodarone (PACERONE) 200 MG tablet Take 200 mg by mouth daily.     Marland Kitchen aspirin EC 81 MG tablet Take 81 mg by mouth daily.    . calcium acetate (PHOSLO) 667 MG capsule Take 1,334 mg by mouth 3 (three) times daily with meals.    . calcium carbonate (TUMS - DOSED IN MG ELEMENTAL CALCIUM) 500 MG chewable tablet Chew 1 tablet by mouth 3 (three) times daily with meals.    . Cranberry 500 MG CAPS Take 1 capsule by mouth 2 (two) times daily.    Marland Kitchen doxycycline (VIBRAMYCIN) 50 MG capsule Take 1 capsule (50 mg total) by mouth 2 (two) times daily. (Patient not taking: Reported on 01/14/2018) 20 capsule 0  . Hydrocortisone (GERHARDT'S BUTT CREAM) CREA Apply 1 application topically 3 (three) times daily.    . Melatonin 5 MG TABS Take 5 mg by mouth at bedtime.     . metoprolol tartrate (LOPRESSOR) 25 MG tablet Take 0.5 tablets (12.5 mg total) by mouth 2 (two) times daily. Hold if sbp less than 100 or heart rate less than 50, hold on dialysis day. (Patient not taking: Reported on 01/14/2018) 60 tablet 0  . midodrine (PROAMATINE) 10 MG tablet Take 10 mg by mouth See admin instructions. 10 mg by mouth daily QD. Medication to be given to resident to take while on dialysis  On Monday, Wednesday and Friday    . promethazine (PHENERGAN) 25 MG tablet Take 25 mg by mouth every 8 (eight) hours as needed for nausea or vomiting.    . traMADol (ULTRAM) 50 MG tablet Take 50 mg by mouth 3 (three) times daily as needed for moderate pain.     No current facility-administered medications for this visit.      REVIEW OF SYSTEMS: see  HPI for pertinent positives and negatives    PHYSICAL EXAMINATION:  Vitals:   01/14/18 1354  BP:  109/60  Pulse: 64  Resp: 18  Temp: 97.6 F (36.4 C)  TempSrc: Oral  SpO2: 100%  Weight: 158 lb (71.7 kg)  Height: 5' 5" (1.651 m)   Body mass index is 26.29 kg/m.  General: The patient appears her stated age, seated in w/c, moderate pallor HEENT: No gross abnormalities Pulmonary: Respirations are non-labored, 2L/[place] in use Abdomen: Soft and non-tender with normal pitched bowel sounds. Musculoskeletal:There are no major deformities.  Neurologic:No focal weakness or paresthesias are detected. Right hand grip strength is 4/5, left is 5/5.  Skin:There are no ulcersor rashes noted. Psychiatric:The patient has normal affect. Cardiovascular:There is a regular rate and rhythm without significant murmur appreciated. Bilateral radial and ulnar pulses are not palpable. Visible veins in left upper chest.    Left upper arm AV fistula  Left upper arm AV graft thrill is palpable with audible bruit. Left arm with dependent edema, left am is larger than right arm.   Bilateral radial and ulnar pulses are not palpable.   Veins apparent on left upper chest, no veins visible right upper chest.   Non-Invasive Vascular Imaging  None today  Medical Decision Making  [name] James is a 82 y.o. female who is s/p insertion of left upper arm AV graft and Removal of old forearm graft on 04/12/15 by Dr. Scot Dock.  She returns with a 2 week hx of left arm swelling, veins apparent in left upper chest.  Dr. Oneida Alar spoke with pt and examined pt.  Will schedule for shuntogram on Tuesday 2-29-19, Dr. Freeman Caldron Nickel, RN, MSN, FNP-C [facility] [place] Office: [phone]  01/14/2018, 2:10 PM  Clinic MD: Oneida Alar

## 2018-01-14 NOTE — Progress Notes (Signed)
CC: Swelling left arm x 2 weeks, has left upper arm AV graft  History of Present Illness  [name] James is a 82 y.o. (June 28, 1936) female who is s/p insertion of left upper arm AV graft and removal of old forearm graft on 04/12/15 by Dr. Scot Dock. Shehad a previous left radiocephalic fistula which failed. She then had a forearm graft. The brachial vein was fairly small and this clotted fairly early. Therefore Dr. Leodis Sias to place a new left upper arm graft.  She returns today with c/o swelling of left arm x 2 weeks. She presented to the ED on 12-25-17 and 12-17-17 with same concern.   She returned on 10-08-16 with report of hardened area at center of graft that was limiting area of graft that can be used. Area was painful, occasionally warm to touch.  Dr. Scot Dock reviewed results of dialysis access duplex performed that day.  Mild dilatationwas noted in the mid segment, stenosis was not significant.  Advised that the two areas being accessed be avoided, to access other areas of the graft. If pt continued to have problems with the graft, she would need to make an appointment with Dr. Scot Dock to discuss creation of another access site.  Pt returned (09-29-17) as she expressing frustration with having to go to [facility] about every month to check her AV graft for problems during HD, and she misses her HD appointment. Daughter stated that pt does fine for a while in HD, then has access issues or bleeding issues in HD. She had recently had a procedure at [facility] to open up a narrow part of the AV graft. Daughter and pt want to know if it is now worth it to look for another access.   She dialyzes M-W-F via the left upper arm AV graft at [facility].  Dr. Lyda Kalata is pt's nephrologist.  Daughter states that pt has had several dilatations of the left upper arm access by the nephrology group.   Pt has a hx of GI bleed in December, 2015. She was given IV heparin at that time for  atrial fib, pt had rectal bleeding, sees Dr. Hilarie Fredrickson.   She is on a single agent antiplatelet medication and no anticoagulants.   The patient is right hand dominant.  Pt states that her left hand stays cold.   Pt is in [facility] facility since she is unable to walk and care for her self adequately. The inability to walk started when she had renal failure and CHF about 2015.    She is to be scheduled for Ir thoracentesis for aspiration pleural space, has pleural effusion and lung nodule.    Past Medical History:  Diagnosis Date  . Anemia, chronic disease   . Anxiety   . Arthritis    knees  . Atrial fibrillation ([place])   . Barrett esophagus   . Benign paroxysmal positional vertigo   . Breast cancer ([place]) 1980s   bil mastectomies, no radiation or chemo  . Cataract   . Chest pain, atypical 12/05/2008   R/Lmv- normal perfusion all regions, noe ECG changes   . CHF (congestive heart failure) ([place]) 05/13/2011   echo - FX>90%; stage 1 diastolic dysfunction; elevated LV filling pressure, MAC  . CKD (chronic kidney disease), stage III ([place])   . Claudication ([place]) 10/30/2005   doppler - normal evaluation, no evidence of aneurysm, diameter reduction, dissection, compression or vascular abnormality)  . Complication of anesthesia   . Dyslipidemia   .  Dysrhythmia    Atrial Fibrillation  . Edema   . ESRD (end stage renal disease) ([place])   . Family history of adverse reaction to anesthesia    son also has nausea   . Fever blister 12/2016  . Gastritis and gastroduodenitis   . GERD (gastroesophageal reflux disease)   . Gout   . Hemodialysis-associated hypotension   . HLD (hyperlipidemia)   . Hypertension   . IBS (irritable bowel syndrome)   . Insomnia   . Morbid obesity ([place])   . PAF (paroxysmal atrial fibrillation) ([place])   . Peripheral neuropathy   . PONV (postoperative nausea and vomiting)   . Renal cyst   . Renal insufficiency 10/30/2005   doppler - abn resistance  consistent w/ parenchymal disease  . Renovascular hypertension   . Type 2 diabetes mellitus with diabetic neuropathy, with long-term current use of insulin ([place])   . Vertigo     Social History Social History   Tobacco Use  . Smoking status: Never Smoker  . Smokeless tobacco: Never Used  Substance Use Topics  . Alcohol use: No  . Drug use: No    Family History Family History  Problem Relation Age of Onset  . Diabetes Mother   . Heart disease Mother   . Non-Hodgkin's lymphoma Mother   . Heart attack Father   . Stroke Father   . Parkinson's disease Brother   . Lung disease Brother   . Heart disease Brother   . Multiple myeloma Sister     Surgical History Past Surgical History:  Procedure Laterality Date  . ANGIOPLASTY Left 02/12/2015   Procedure: ANGIOPLASTY;  Surgeon: Angelia Mould, MD;  Location: [facility] CATH LAB;  Service: Cardiovascular;  Laterality: Left;  AVF  . AV FISTULA PLACEMENT Left 11/30/2014   Procedure: ARTERIOVENOUS (AV) FISTULA CREATION LEFT ARM;  Surgeon: Angelia Mould, MD;  Location: [facility];  Service: Vascular;  Laterality: Left;  . AV FISTULA PLACEMENT Left 03/20/2015   Procedure:  Inserton of Left Upper Arm Gortex Graft;  Surgeon: Angelia Mould, MD;  Location: [place];  Service: Vascular;  Laterality: Left;  . AV FISTULA PLACEMENT Left 04/12/2015   Procedure: INSERTION OF LEFT ARM  ARTERIOVENOUS GORE-TEX GRAFT ;  Surgeon: Angelia Mould, MD;  Location: [place];  Service: Vascular;  Laterality: Left;  . BACK SURGERY  ~1980  . CATARACT EXTRACTION Right   . CHOLECYSTECTOMY    . ESOPHAGOGASTRODUODENOSCOPY N/A 10/24/2014   Procedure: ESOPHAGOGASTRODUODENOSCOPY (EGD);  Surgeon: Jerene Bears, MD;  Location: [facility] ENDOSCOPY;  Service: Endoscopy;  Laterality: N/A;  . EXCHANGE OF A DIALYSIS CATHETER Left 11/30/2014   Procedure: EXCHANGE OF A DIALYSIS CATHETER, LEFT INTERNAL JUGULAR;  Surgeon: Angelia Mould, MD;  Location: [place];  Service:  Vascular;  Laterality: Left;  . EYE SURGERY Left    transplant  . FISTULOGRAM N/A 02/12/2015   Procedure: FISTULOGRAM;  Surgeon: Angelia Mould, MD;  Location: [facility] CATH LAB;  Service: Cardiovascular;  Laterality: N/A;  . HEMORRHOID SURGERY    . LIGATION OF ARTERIOVENOUS  FISTULA Left 03/20/2015   Procedure: LIGATION OF ARTERIOVENOUS  FISTULA;  Surgeon: Angelia Mould, MD;  Location: [place];  Service: Vascular;  Laterality: Left;  Marland Kitchen MASTECTOMY Bilateral ~1982   bil breast reconstruction with implants  . ORIF TIBIA & FIBULA FRACTURES Left 2007   also had left non displaced malleolar fracture.   Marland Kitchen REMOVAL OF GRAFT Left 04/12/2015   Procedure: REMOVAL OF LEFT ARM ARTERIOVENOUS  GORE-TEX GRAFT;  Surgeon: Angelia Mould, MD;  Location: [place];  Service: Vascular;  Laterality: Left;    Allergies  Allergen Reactions  . Erythromycin Swelling  . Erythromycin Base     Other reaction(s): GI Upset (intolerance)  . Penicillins Other (See Comments)    Unknown allergic reaction.  She has taken keflex and Rocephin many times without problems  . Codeine Rash and Palpitations    Other reaction(s): GI Upset (intolerance)  . Penicillin G Rash    Current Outpatient Medications  Medication Sig Dispense Refill  . B Complex-C-Folic Acid (NEPHRO-VITE PO) Take 1 tablet by mouth at bedtime.     . diazepam (VALIUM) 5 MG tablet Take one tablet by mouth every night at bedtime for rest 30 tablet 0  . dorzolamide-timolol (COSOPT) 22.3-6.8 MG/ML ophthalmic solution Place 1 drop into the left eye 2 (two) times daily. Wait 2-5 minutes when giving two eye meds    . fluticasone (FLONASE ALLERGY RELIEF) 50 MCG/ACT nasal spray Place 2 sprays into both nostrils daily.    . hydroxypropyl methylcellulose / hypromellose (ISOPTO TEARS / GONIOVISC) 2.5 % ophthalmic solution Place 1 drop into both eyes 3 (three) times daily as needed for dry eyes.    Marland Kitchen loperamide (IMODIUM A-D) 2 MG tablet Take 2 mg by mouth 3  (three) times daily as needed for diarrhea or loose stools.    . OXYGEN Inhale 2 L/min into the lungs. 24/7    . acetaminophen (TYLENOL) 325 MG tablet Take 650 mg by mouth every 6 (six) hours as needed for mild pain or fever.     Marland Kitchen allopurinol (ZYLOPRIM) 100 MG tablet Take 100 mg by mouth daily.    Marland Kitchen amiodarone (PACERONE) 200 MG tablet Take 200 mg by mouth daily.     Marland Kitchen aspirin EC 81 MG tablet Take 81 mg by mouth daily.    . calcium acetate (PHOSLO) 667 MG capsule Take 1,334 mg by mouth 3 (three) times daily with meals.    . calcium carbonate (TUMS - DOSED IN MG ELEMENTAL CALCIUM) 500 MG chewable tablet Chew 1 tablet by mouth 3 (three) times daily with meals.    . Cranberry 500 MG CAPS Take 1 capsule by mouth 2 (two) times daily.    Marland Kitchen doxycycline (VIBRAMYCIN) 50 MG capsule Take 1 capsule (50 mg total) by mouth 2 (two) times daily. (Patient not taking: Reported on 01/14/2018) 20 capsule 0  . Hydrocortisone (GERHARDT'S BUTT CREAM) CREA Apply 1 application topically 3 (three) times daily.    . Melatonin 5 MG TABS Take 5 mg by mouth at bedtime.     . metoprolol tartrate (LOPRESSOR) 25 MG tablet Take 0.5 tablets (12.5 mg total) by mouth 2 (two) times daily. Hold if sbp less than 100 or heart rate less than 50, hold on dialysis day. (Patient not taking: Reported on 01/14/2018) 60 tablet 0  . midodrine (PROAMATINE) 10 MG tablet Take 10 mg by mouth See admin instructions. 10 mg by mouth daily QD. Medication to be given to resident to take while on dialysis  On Monday, Wednesday and Friday    . promethazine (PHENERGAN) 25 MG tablet Take 25 mg by mouth every 8 (eight) hours as needed for nausea or vomiting.    . traMADol (ULTRAM) 50 MG tablet Take 50 mg by mouth 3 (three) times daily as needed for moderate pain.     No current facility-administered medications for this visit.      REVIEW OF SYSTEMS: see  HPI for pertinent positives and negatives    PHYSICAL EXAMINATION:  Vitals:   01/14/18 1354  BP:  109/60  Pulse: 64  Resp: 18  Temp: 97.6 F (36.4 C)  TempSrc: Oral  SpO2: 100%  Weight: 158 lb (71.7 kg)  Height: 5' 5" (1.651 m)   Body mass index is 26.29 kg/m.  General: The patient appears her stated age, seated in w/c, moderate pallor HEENT: No gross abnormalities Pulmonary: Respirations are non-labored, 2L/[place] in use Abdomen: Soft and non-tender with normal pitched bowel sounds. Musculoskeletal:There are no major deformities.  Neurologic:No focal weakness or paresthesias are detected. Right hand grip strength is 4/5, left is 5/5.  Skin:There are no ulcersor rashes noted. Psychiatric:The patient has normal affect. Cardiovascular:There is a regular rate and rhythm without significant murmur appreciated. Bilateral radial and ulnar pulses are not palpable. Visible veins in left upper chest.    Left upper arm AV fistula  Left upper arm AV graft thrill is palpable with audible bruit. Left arm with dependent edema, left am is larger than right arm.   Bilateral radial and ulnar pulses are not palpable.   Veins apparent on left upper chest, no veins visible right upper chest.   Non-Invasive Vascular Imaging  None today  Medical Decision Making  BRENLYN BESHARA is a 82 y.o. female who is s/p insertion of left upper arm AV graft and Removal of old forearm graft on 04/12/15 by Dr. Scot Dock.  She returns with a 2 week hx of left arm swelling, veins apparent in left upper chest.  Dr. Oneida Alar spoke with pt and examined pt.  Will schedule for shuntogram on Tuesday 2-29-19, Dr. Freeman Caldron Renly Guedes, RN, MSN, FNP-C [facility] [place] Office: [phone]  01/14/2018, 2:10 PM  Clinic MD: Oneida Alar

## 2018-01-15 DIAGNOSIS — N2581 Secondary hyperparathyroidism of renal origin: Secondary | ICD-10-CM | POA: Diagnosis not present

## 2018-01-15 DIAGNOSIS — D631 Anemia in chronic kidney disease: Secondary | ICD-10-CM | POA: Diagnosis not present

## 2018-01-15 DIAGNOSIS — D509 Iron deficiency anemia, unspecified: Secondary | ICD-10-CM | POA: Diagnosis not present

## 2018-01-15 DIAGNOSIS — E1122 Type 2 diabetes mellitus with diabetic chronic kidney disease: Secondary | ICD-10-CM | POA: Diagnosis not present

## 2018-01-15 DIAGNOSIS — N186 End stage renal disease: Secondary | ICD-10-CM | POA: Diagnosis not present

## 2018-01-18 DIAGNOSIS — N2581 Secondary hyperparathyroidism of renal origin: Secondary | ICD-10-CM | POA: Diagnosis not present

## 2018-01-18 DIAGNOSIS — E1122 Type 2 diabetes mellitus with diabetic chronic kidney disease: Secondary | ICD-10-CM | POA: Diagnosis not present

## 2018-01-18 DIAGNOSIS — N186 End stage renal disease: Secondary | ICD-10-CM | POA: Diagnosis not present

## 2018-01-18 DIAGNOSIS — D509 Iron deficiency anemia, unspecified: Secondary | ICD-10-CM | POA: Diagnosis not present

## 2018-01-18 DIAGNOSIS — D631 Anemia in chronic kidney disease: Secondary | ICD-10-CM | POA: Diagnosis not present

## 2018-01-19 ENCOUNTER — Encounter (HOSPITAL_COMMUNITY): Payer: Self-pay | Admitting: Surgery

## 2018-01-19 ENCOUNTER — Ambulatory Visit (HOSPITAL_COMMUNITY)
Admission: RE | Disposition: A | Discharge status: Home / Self Care | Payer: Self-pay | Source: Ambulatory Visit | Attending: Surgery

## 2018-01-19 ENCOUNTER — Ambulatory Visit (HOSPITAL_COMMUNITY)
Admission: RE | Admit: 2018-01-19 | Discharge: 2018-01-19 | Disposition: A | Discharge status: Home / Self Care | Payer: Medicare Other | Source: Ambulatory Visit | Attending: Surgery | Admitting: Surgery

## 2018-01-19 DIAGNOSIS — Z853 Personal history of malignant neoplasm of breast: Secondary | ICD-10-CM | POA: Insufficient documentation

## 2018-01-19 DIAGNOSIS — Z79899 Other long term (current) drug therapy: Secondary | ICD-10-CM | POA: Diagnosis not present

## 2018-01-19 DIAGNOSIS — N186 End stage renal disease: Secondary | ICD-10-CM | POA: Insufficient documentation

## 2018-01-19 DIAGNOSIS — Z794 Long term (current) use of insulin: Secondary | ICD-10-CM | POA: Insufficient documentation

## 2018-01-19 DIAGNOSIS — Z7982 Long term (current) use of aspirin: Secondary | ICD-10-CM | POA: Insufficient documentation

## 2018-01-19 DIAGNOSIS — Z881 Allergy status to other antibiotic agents status: Secondary | ICD-10-CM | POA: Insufficient documentation

## 2018-01-19 DIAGNOSIS — I132 Hypertensive heart and chronic kidney disease with heart failure and with stage 5 chronic kidney disease, or end stage renal disease: Secondary | ICD-10-CM | POA: Diagnosis not present

## 2018-01-19 DIAGNOSIS — Z992 Dependence on renal dialysis: Secondary | ICD-10-CM | POA: Insufficient documentation

## 2018-01-19 DIAGNOSIS — Z6827 Body mass index (BMI) 27.0-27.9, adult: Secondary | ICD-10-CM | POA: Insufficient documentation

## 2018-01-19 DIAGNOSIS — E1140 Type 2 diabetes mellitus with diabetic neuropathy, unspecified: Secondary | ICD-10-CM | POA: Insufficient documentation

## 2018-01-19 DIAGNOSIS — E1122 Type 2 diabetes mellitus with diabetic chronic kidney disease: Secondary | ICD-10-CM | POA: Insufficient documentation

## 2018-01-19 DIAGNOSIS — Z9013 Acquired absence of bilateral breasts and nipples: Secondary | ICD-10-CM | POA: Insufficient documentation

## 2018-01-19 DIAGNOSIS — Z885 Allergy status to narcotic agent status: Secondary | ICD-10-CM | POA: Insufficient documentation

## 2018-01-19 DIAGNOSIS — E785 Hyperlipidemia, unspecified: Secondary | ICD-10-CM | POA: Diagnosis not present

## 2018-01-19 DIAGNOSIS — Y832 Surgical operation with anastomosis, bypass or graft as the cause of abnormal reaction of the patient, or of later complication, without mention of misadventure at the time of the procedure: Secondary | ICD-10-CM | POA: Insufficient documentation

## 2018-01-19 DIAGNOSIS — T82898A Other specified complication of vascular prosthetic devices, implants and grafts, initial encounter: Secondary | ICD-10-CM | POA: Insufficient documentation

## 2018-01-19 DIAGNOSIS — I509 Heart failure, unspecified: Secondary | ICD-10-CM | POA: Diagnosis not present

## 2018-01-19 DIAGNOSIS — Z9981 Dependence on supplemental oxygen: Secondary | ICD-10-CM | POA: Insufficient documentation

## 2018-01-19 DIAGNOSIS — Z880 Allergy status to penicillin: Secondary | ICD-10-CM | POA: Insufficient documentation

## 2018-01-19 HISTORY — PX: A/V SHUNTOGRAM: CATH118297

## 2018-01-19 HISTORY — PX: PERIPHERAL VASCULAR BALLOON ANGIOPLASTY: CATH118281

## 2018-01-19 LAB — GLUCOSE, CAPILLARY: GLUCOSE-CAPILLARY: 95 mg/dL (ref 65–99)

## 2018-01-19 LAB — POCT I-STAT, CHEM 8
BUN: 26 mg/dL — AB (ref 6–20)
CALCIUM ION: 0.98 mmol/L — AB (ref 1.15–1.40)
CREATININE: 3.3 mg/dL — AB (ref 0.44–1.00)
Chloride: 96 mmol/L — ABNORMAL LOW (ref 101–111)
Glucose, Bld: 139 mg/dL — ABNORMAL HIGH (ref 65–99)
HEMATOCRIT: 37 % (ref 36.0–46.0)
Hemoglobin: 12.6 g/dL (ref 12.0–15.0)
Potassium: 3.6 mmol/L (ref 3.5–5.1)
Sodium: 140 mmol/L (ref 135–145)
TCO2: 33 mmol/L — AB (ref 22–32)

## 2018-01-19 SURGERY — A/V SHUNTOGRAM
Anesthesia: LOCAL | Laterality: Left

## 2018-01-19 MED ORDER — SODIUM CHLORIDE 0.9% FLUSH: 3.0000 mL | INTRAVENOUS | Status: DC | PRN

## 2018-01-19 MED ORDER — SODIUM CHLORIDE 0.9% FLUSH: 3.0000 mL | Freq: Two times a day (BID) | INTRAVENOUS | Status: DC

## 2018-01-19 MED ORDER — FENTANYL CITRATE (PF) 100 MCG/2ML IJ SOLN
INTRAMUSCULAR | Status: DC | PRN
  Administered 2018-01-19: 25 ug via INTRAVENOUS

## 2018-01-19 MED ORDER — HEPARIN SODIUM (PORCINE) 1000 UNIT/ML IJ SOLN
INTRAMUSCULAR | Status: AC
  Filled 2018-01-19: qty 1

## 2018-01-19 MED ORDER — HEPARIN (PORCINE) IN NACL 2-0.9 UNIT/ML-% IJ SOLN
INTRAMUSCULAR | Status: AC
  Filled 2018-01-19: qty 500

## 2018-01-19 MED ORDER — SODIUM CHLORIDE 0.9 % IV SOLN: 250.0000 mL | INTRAVENOUS | Status: DC | PRN

## 2018-01-19 MED ORDER — HEPARIN SODIUM (PORCINE) 1000 UNIT/ML IJ SOLN
INTRAMUSCULAR | Status: DC | PRN
  Administered 2018-01-19: 3000 [IU] via INTRAVENOUS

## 2018-01-19 MED ORDER — HYDRALAZINE HCL 20 MG/ML IJ SOLN: 5.0000 mg | INTRAMUSCULAR | Status: DC | PRN

## 2018-01-19 MED ORDER — MIDAZOLAM HCL 2 MG/2ML IJ SOLN
INTRAMUSCULAR | Status: AC
  Filled 2018-01-19: qty 2

## 2018-01-19 MED ORDER — MIDAZOLAM HCL 2 MG/2ML IJ SOLN
INTRAMUSCULAR | Status: DC | PRN
Start: 2018-01-19 — End: 2018-01-19
  Administered 2018-01-19: 0.5 mg via INTRAVENOUS

## 2018-01-19 MED ORDER — LIDOCAINE HCL (PF) 1 % IJ SOLN
INTRAMUSCULAR | Status: DC | PRN
  Administered 2018-01-19: 2 mL

## 2018-01-19 MED ORDER — FENTANYL CITRATE (PF) 100 MCG/2ML IJ SOLN
INTRAMUSCULAR | Status: AC
  Filled 2018-01-19: qty 2

## 2018-01-19 MED ORDER — LABETALOL HCL 5 MG/ML IV SOLN: 10.0000 mg | INTRAVENOUS | Status: DC | PRN

## 2018-01-19 MED ORDER — IODIXANOL 320 MG/ML IV SOLN
INTRAVENOUS | Status: DC | PRN
  Administered 2018-01-19: 45 mL via INTRAVENOUS

## 2018-01-19 MED ORDER — LIDOCAINE HCL 1 % IJ SOLN
INTRAMUSCULAR | Status: AC
  Filled 2018-01-19: qty 20

## 2018-01-19 SURGICAL SUPPLY — 22 items
BAG SNAP BAND KOVER 36X36 (MISCELLANEOUS) ×2 IMPLANT
BALLN LUTONIX AV 9X60X75 (BALLOONS) ×2
BALLN MUSTANG 10X80X75 (BALLOONS) ×2
BALLOON LUTONIX AV 9X60X75 (BALLOONS) IMPLANT
BALLOON MUSTANG 10X80X75 (BALLOONS) IMPLANT
CATH ANGIO 5F BER2 65CM (CATHETERS) ×1 IMPLANT
COVER DOME SNAP 22 D (MISCELLANEOUS) ×2 IMPLANT
COVER PRB 48X5XTLSCP FOLD TPE (BAG) ×1 IMPLANT
COVER PROBE 5X48 (BAG) ×2
DEVICE TORQUE H2O (MISCELLANEOUS) ×1 IMPLANT
GUIDEWIRE ANGLED .035X150CM (WIRE) ×1 IMPLANT
KIT ENCORE 26 ADVANTAGE (KITS) ×1 IMPLANT
KIT MICROINTRODUCER STIFF 5F (SHEATH) ×1 IMPLANT
PROTECTION STATION PRESSURIZED (MISCELLANEOUS) ×2
SHEATH PINNACLE R/O II 5F 6CM (SHEATH) ×1 IMPLANT
SHEATH PINNACLE R/O II 7F 4CM (SHEATH) ×1 IMPLANT
STATION PROTECTION PRESSURIZED (MISCELLANEOUS) ×1 IMPLANT
STOPCOCK MORSE 400PSI 3WAY (MISCELLANEOUS) ×2 IMPLANT
TRAY PV CATH (CUSTOM PROCEDURE TRAY) ×2 IMPLANT
TUBING CIL FLEX 10 FLL-RA (TUBING) ×2 IMPLANT
WIRE BENTSON .035X145CM (WIRE) ×1 IMPLANT
WIRE HI TORQ VERSACORE J 260CM (WIRE) ×1 IMPLANT

## 2018-01-19 NOTE — Interval H&P Note (Signed)
History and Physical Interval Note:  01/19/2018 9:18 AM  [name] James  has presented today for surgery, with the diagnosis of left arm swelling  The various methods of treatment have been discussed with the patient and family. After consideration of risks, benefits and other options for treatment, the patient has consented to  Procedure(s): A/V SHUNTOGRAM (Left) as a surgical intervention .  The patient's history has been reviewed, patient examined, no change in status, stable for surgery.  I have reviewed the patient's chart and labs.  Questions were answered to the patient's satisfaction.     [name]

## 2018-01-19 NOTE — Op Note (Signed)
    Patient name: [name] James MRN: [id number] DOB: 1936-11-06 Sex: female  01/19/2018 Pre-operative Diagnosis: Left arm swelling Post-operative diagnosis:  Same Surgeon:  [name] Procedure Performed:  1.  Ultrasound-guided access, left upper arm dialysis graft  2.  Shuntogram  3.  Angioplasty, left innominate vein and left subclavian vein  4.  Conscious sedation (17minutes)   Indications: The patient is having difficulty with left arm swelling.  She has a dialysis graft in place.  She is here today for shuntogram and possible intervention  Procedure:  The patient was identified in the holding area and taken to room 8.  The patient was then placed supine on the table and prepped and draped in the usual sterile fashion.  A time out was called.  Conscious sedation was administered with the use of IV fentanyl and Versed under continuous physician and nurse monitoring.  Heart rate, blood pressure, and oxygen saturations were continuously monitored.  Ultrasound was used to evaluate the fistula.  The vein was patent and compressible.  A digital ultrasound image was acquired.  The fistula was then accessed under ultrasound guidance using a micropuncture needle.  An 018 wire was then asvanced without resistance and a micropuncture sheath was placed.  Contrast injections were then performed through the sheath.  Findings: Central vein occlusion.  The axillary and brachial veins appear to be patent.   Intervention: After the above images were acquired the decision was made to proceed with intervention.  A 7 French sheath was inserted.  3000 units of heparin were given.  I then used a 035 Glidewire and a Berenstein 2 catheter to successfully cross the central vein occlusion.  A versa core wire was then placed and the catheter was removed.  Performed primary balloon angioplasty using 10 x 80 Mustang balloon.  Follow-up imaging revealed improved patency throughout the length of the occlusion.  I then  selected a 9 x 40 drug-coated Lutonix balloon and performed drug-coated balloon angioplasty of the left innominate and subclavian vein, taking the vein to rated pressure for 2 minutes.  Follow-up imaging revealed no residual stenosis and widely patent central venous system.  Decision was made to terminate the procedure.  Catheters and wires were removed.  A pursestring suture was used graftotomy closure.  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 [name], M.D. [facility] Office: [phone] Pager:  [phone]

## 2018-01-19 NOTE — Discharge Instructions (Signed)

## 2018-01-19 NOTE — Interval H&P Note (Signed)
History and Physical Interval Note:  01/19/2018 9:56 AM  [name] James  has presented today for surgery, with the diagnosis of left arm swelling  The various methods of treatment have been discussed with the patient and family. After consideration of risks, benefits and other options for treatment, the patient has consented to  Procedure(s): A/V SHUNTOGRAM (Left) as a surgical intervention .  The patient's history has been reviewed, patient examined, no change in status, stable for surgery.  I have reviewed the patient's chart and labs.  Questions were answered to the patient's satisfaction.     [name]

## 2018-01-20 DIAGNOSIS — N2581 Secondary hyperparathyroidism of renal origin: Secondary | ICD-10-CM | POA: Diagnosis not present

## 2018-01-20 DIAGNOSIS — D631 Anemia in chronic kidney disease: Secondary | ICD-10-CM | POA: Diagnosis not present

## 2018-01-20 DIAGNOSIS — D509 Iron deficiency anemia, unspecified: Secondary | ICD-10-CM | POA: Diagnosis not present

## 2018-01-20 DIAGNOSIS — E1122 Type 2 diabetes mellitus with diabetic chronic kidney disease: Secondary | ICD-10-CM | POA: Diagnosis not present

## 2018-01-20 DIAGNOSIS — N186 End stage renal disease: Secondary | ICD-10-CM | POA: Diagnosis not present

## 2018-01-20 MED FILL — Heparin Sodium (Porcine) 2 Unit/ML in Sodium Chloride 0.9%: INTRAMUSCULAR | Qty: 500 | Status: AC

## 2018-01-20 MED FILL — Lidocaine HCl Local Inj 1%: INTRAMUSCULAR | Qty: 20 | Status: AC

## 2018-01-21 ENCOUNTER — Ambulatory Visit (INDEPENDENT_AMBULATORY_CARE_PROVIDER_SITE_OTHER): Payer: Medicare Other | Admitting: Internal Medicine

## 2018-01-21 ENCOUNTER — Encounter: Payer: Self-pay | Admitting: Internal Medicine

## 2018-01-21 VITALS — BP 130/46 | HR 68

## 2018-01-21 DIAGNOSIS — K219 Gastro-esophageal reflux disease without esophagitis: Secondary | ICD-10-CM | POA: Diagnosis not present

## 2018-01-21 DIAGNOSIS — R110 Nausea: Secondary | ICD-10-CM

## 2018-01-21 DIAGNOSIS — K2270 Barrett's esophagus without dysplasia: Secondary | ICD-10-CM | POA: Diagnosis not present

## 2018-01-21 DIAGNOSIS — Z992 Dependence on renal dialysis: Secondary | ICD-10-CM | POA: Diagnosis not present

## 2018-01-21 DIAGNOSIS — E1122 Type 2 diabetes mellitus with diabetic chronic kidney disease: Secondary | ICD-10-CM | POA: Diagnosis not present

## 2018-01-21 DIAGNOSIS — N186 End stage renal disease: Secondary | ICD-10-CM | POA: Diagnosis not present

## 2018-01-21 MED ORDER — RANITIDINE HCL 150 MG PO TABS: 150.0000 mg | ORAL_TABLET | Freq: Two times a day (BID) | ORAL | 10 refills | Status: DC

## 2018-01-21 NOTE — Patient Instructions (Addendum)
Please start ranitidine 150 mg by mouth every 12 hours. We have written a script for this. We have also spoken   If you are age 80 or older, your body mass index should be between 23-30. Your There is no height or weight on file to calculate BMI. If this is out of the aforementioned range listed, please consider follow up with your Primary Care Provider.  If you are age 81 or younger, your body mass index should be between 19-25. Your There is no height or weight on file to calculate BMI. If this is out of the aformentioned range listed, please consider follow up with your Primary Care Provider.

## 2018-01-21 NOTE — Progress Notes (Signed)
Subjective:    Patient ID: [name] James, female    DOB: Apr 25, 1936, 82 y.o.   MRN: [id number]  HPI [name] James is an 82 yo female with history of GERD, Barrett's esophagus, gastritis, anemia, end-stage renal disease on dialysis, COPD on oxygen, remote breast cancer who is here for follow-up.  She is here alone today and was last seen on 06/25/2017.  She reports that she has been feeling fairly well recently.  She had a vascular procedure recently to open up her dialysis access.  She reports that she has intermittent nausea but that the Phenergan really helps with this symptom.  She is not having heartburn or trouble swallowing.  She is using Tums on occasion for indigestion.  We started her on pantoprazole at her last visit but this is not on her medication list today.  We have had a hard time communicating with her facility and understanding what happens with our recommendations and her medications.  She has a hard time with phosphate binders as these hurt her stomach each time.  She is discussed this with her nephrologist and is currently not using them.  Recently her bowel movements have been good without much diarrhea or constipation.  She will use Imodium on occasion for loose stool.  Most recently her bowel movements have been every few days.  She denies seeing visible blood or melena.  Her blood counts are followed by nephrology and she has received IV iron.  Most recently her hemoglobin has normalized.  Review of Systems As per HPI, otherwise negative  Current Medications, Allergies, Past Medical History, Past Surgical History, Family History and Social History were reviewed in [employer] record.      Objective:   Physical Exam BP (!) 130/46 (BP Location: Right Arm, Patient Position: Sitting, Cuff Size: Normal)   Pulse 68  Constitutional: Well-developed, elderly appearing female in a wheelchair, no distress. HEENT: Normocephalic and atraumatic. Oropharynx  is clear and moist. No scleral icterus. Neck: Neck supple. Trachea midline. Cardiovascular: Normal rate, regular rhythm and intact distal pulses.  Pulmonary/chest: Effort normal and breath sounds normal. No wheezing, rales or rhonchi. Abdominal: Soft, nontender, nondistended. Bowel sounds active throughout.  Extremities: no clubbing, cyanosis, or edema Neurological: Alert and oriented to person place and time. Skin: Skin is warm and dry. Psychiatric: Normal mood and affect. Behavior is normal.  CBC    Component Value Date/Time   WBC 9.7 12/26/2017 0007   RBC 3.38 (L) 12/26/2017 0007   HGB 12.6 01/19/2018 0804   HGB 13.0 09/06/2008 1552   HCT 37.0 01/19/2018 0804   HCT 38.3 09/06/2008 1552   PLT 229 12/26/2017 0007   PLT 188 09/06/2008 1552   MCV 103.8 (H) 12/26/2017 0007   MCV 95.6 04/02/2014 0908   MCV 95.0 09/06/2008 1552   MCH 31.7 12/26/2017 0007   MCHC 30.5 12/26/2017 0007   RDW 17.4 (H) 12/26/2017 0007   RDW 15.3 (H) 09/06/2008 1552   LYMPHSABS 3.4 12/26/2017 0007   LYMPHSABS 4.2 (H) 09/06/2008 1552   MONOABS 0.9 12/26/2017 0007   MONOABS 0.7 09/06/2008 1552   EOSABS 0.3 12/26/2017 0007   EOSABS 0.2 09/06/2008 1552   BASOSABS 0.1 12/26/2017 0007   BASOSABS 0.0 09/06/2008 1552       Assessment & Plan:  82 yo female with history of GERD, Barrett's esophagus, gastritis, anemia, end-stage renal disease on dialysis, COPD on oxygen, remote breast cancer who is here for follow-up.   1.  GERD/indigestion/history  of Barrett's --unclear why the PPI recommended last time is not on her medication record.  Perhaps PPI has worsened diarrhea or there is some concern with her renal function though she is on dialysis.  I will recommend ranitidine which I know she is tolerated in the past.  Begin ranitidine 150 mg every 12 hours.  This should help with reflux, indigestion and possibly with nausea and stomach upset related to other medications.  2.  History of loose stools --not an  issue recently.  She is using Imodium as needed.  3.  History of anemia --multifactorial and improved with recent hemoglobin normal at 12.6.  Monitored by nephrology and receiving periodic iron.  4.  Intermittent nausea --continue Phenergan on an as-needed basis.  See #1  6-68-month follow-up, sooner as needed 15 minutes spent with the patient today. Greater than 50% was spent in counseling and coordination of care with the patient

## 2018-01-22 DIAGNOSIS — N2581 Secondary hyperparathyroidism of renal origin: Secondary | ICD-10-CM | POA: Diagnosis not present

## 2018-01-22 DIAGNOSIS — D509 Iron deficiency anemia, unspecified: Secondary | ICD-10-CM | POA: Diagnosis not present

## 2018-01-22 DIAGNOSIS — D631 Anemia in chronic kidney disease: Secondary | ICD-10-CM | POA: Diagnosis not present

## 2018-01-22 DIAGNOSIS — Z992 Dependence on renal dialysis: Secondary | ICD-10-CM | POA: Diagnosis not present

## 2018-01-22 DIAGNOSIS — E1122 Type 2 diabetes mellitus with diabetic chronic kidney disease: Secondary | ICD-10-CM | POA: Diagnosis not present

## 2018-01-22 DIAGNOSIS — N186 End stage renal disease: Secondary | ICD-10-CM | POA: Diagnosis not present

## 2018-01-25 DIAGNOSIS — D631 Anemia in chronic kidney disease: Secondary | ICD-10-CM | POA: Diagnosis not present

## 2018-01-25 DIAGNOSIS — N2581 Secondary hyperparathyroidism of renal origin: Secondary | ICD-10-CM | POA: Diagnosis not present

## 2018-01-25 DIAGNOSIS — E1122 Type 2 diabetes mellitus with diabetic chronic kidney disease: Secondary | ICD-10-CM | POA: Diagnosis not present

## 2018-01-25 DIAGNOSIS — N186 End stage renal disease: Secondary | ICD-10-CM | POA: Diagnosis not present

## 2018-01-25 DIAGNOSIS — D509 Iron deficiency anemia, unspecified: Secondary | ICD-10-CM | POA: Diagnosis not present

## 2018-01-26 DIAGNOSIS — D631 Anemia in chronic kidney disease: Secondary | ICD-10-CM | POA: Diagnosis not present

## 2018-01-26 DIAGNOSIS — Z992 Dependence on renal dialysis: Secondary | ICD-10-CM | POA: Diagnosis not present

## 2018-01-26 DIAGNOSIS — N186 End stage renal disease: Secondary | ICD-10-CM | POA: Diagnosis not present

## 2018-01-26 DIAGNOSIS — E119 Type 2 diabetes mellitus without complications: Secondary | ICD-10-CM | POA: Diagnosis not present

## 2018-01-27 DIAGNOSIS — D509 Iron deficiency anemia, unspecified: Secondary | ICD-10-CM | POA: Diagnosis not present

## 2018-01-27 DIAGNOSIS — E1122 Type 2 diabetes mellitus with diabetic chronic kidney disease: Secondary | ICD-10-CM | POA: Diagnosis not present

## 2018-01-27 DIAGNOSIS — D631 Anemia in chronic kidney disease: Secondary | ICD-10-CM | POA: Diagnosis not present

## 2018-01-27 DIAGNOSIS — N2581 Secondary hyperparathyroidism of renal origin: Secondary | ICD-10-CM | POA: Diagnosis not present

## 2018-01-27 DIAGNOSIS — N186 End stage renal disease: Secondary | ICD-10-CM | POA: Diagnosis not present

## 2018-01-29 DIAGNOSIS — D509 Iron deficiency anemia, unspecified: Secondary | ICD-10-CM | POA: Diagnosis not present

## 2018-01-29 DIAGNOSIS — E1122 Type 2 diabetes mellitus with diabetic chronic kidney disease: Secondary | ICD-10-CM | POA: Diagnosis not present

## 2018-01-29 DIAGNOSIS — N2581 Secondary hyperparathyroidism of renal origin: Secondary | ICD-10-CM | POA: Diagnosis not present

## 2018-01-29 DIAGNOSIS — N186 End stage renal disease: Secondary | ICD-10-CM | POA: Diagnosis not present

## 2018-01-29 DIAGNOSIS — D631 Anemia in chronic kidney disease: Secondary | ICD-10-CM | POA: Diagnosis not present

## 2018-02-01 DIAGNOSIS — D631 Anemia in chronic kidney disease: Secondary | ICD-10-CM | POA: Diagnosis not present

## 2018-02-01 DIAGNOSIS — E1122 Type 2 diabetes mellitus with diabetic chronic kidney disease: Secondary | ICD-10-CM | POA: Diagnosis not present

## 2018-02-01 DIAGNOSIS — N186 End stage renal disease: Secondary | ICD-10-CM | POA: Diagnosis not present

## 2018-02-01 DIAGNOSIS — D509 Iron deficiency anemia, unspecified: Secondary | ICD-10-CM | POA: Diagnosis not present

## 2018-02-01 DIAGNOSIS — N2581 Secondary hyperparathyroidism of renal origin: Secondary | ICD-10-CM | POA: Diagnosis not present

## 2018-02-03 DIAGNOSIS — N2581 Secondary hyperparathyroidism of renal origin: Secondary | ICD-10-CM | POA: Diagnosis not present

## 2018-02-03 DIAGNOSIS — N186 End stage renal disease: Secondary | ICD-10-CM | POA: Diagnosis not present

## 2018-02-03 DIAGNOSIS — D631 Anemia in chronic kidney disease: Secondary | ICD-10-CM | POA: Diagnosis not present

## 2018-02-03 DIAGNOSIS — E1122 Type 2 diabetes mellitus with diabetic chronic kidney disease: Secondary | ICD-10-CM | POA: Diagnosis not present

## 2018-02-03 DIAGNOSIS — D509 Iron deficiency anemia, unspecified: Secondary | ICD-10-CM | POA: Diagnosis not present

## 2018-02-04 ENCOUNTER — Ambulatory Visit (INDEPENDENT_AMBULATORY_CARE_PROVIDER_SITE_OTHER): Payer: Medicare Other | Admitting: Pulmonary Disease

## 2018-02-04 ENCOUNTER — Encounter: Payer: Self-pay | Admitting: Pulmonary Disease

## 2018-02-04 VITALS — BP 138/69 | HR 59 | Ht 63.0 in | Wt 161.0 lb

## 2018-02-04 DIAGNOSIS — R918 Other nonspecific abnormal finding of lung field: Secondary | ICD-10-CM | POA: Diagnosis not present

## 2018-02-04 DIAGNOSIS — J9611 Chronic respiratory failure with hypoxia: Secondary | ICD-10-CM | POA: Diagnosis not present

## 2018-02-04 DIAGNOSIS — J90 Pleural effusion, not elsewhere classified: Secondary | ICD-10-CM | POA: Diagnosis not present

## 2018-02-04 NOTE — Progress Notes (Signed)
Subjective:   PATIENT ID: [name] James GENDER: female DOB: [birth year], MRN: [id number]  Synopsis: Former patient of Dr. Ashok Cordia with a pleural effusion, ESRD and chronic respiratory failure with hypoxemia  HPI  Chief Complaint  Patient presents with  . new consult    JN patient, recent CT scan   [name] James is here to see me for a chronic left-sided pleural effusion and chronic respiratory failure with hypoxemia.  She says that in January 2018 she struggled with pneumonia and influenza and was hospitalized for the same.  At that point she had left-sided pleural effusion recognized.  Attempts were made to drain the fluid but she was left with a pneumothorax ex vacuo.  She is followed in our clinic off and on since then.  She also has pulmonary nodules.  She tells me that in October 2015 she was diagnosed with congestive heart failure.  By review of records at that time she had a large left-sided pleural effusion.  She has been using oxygen for quite some time and she currently lives in a nursing home.  She goes to dialysis 3 times a week.  Lately she says her breathing has been fine.  She denies cough or weight change.  No recent chest pain.  She continues to use and benefit from her oxygen every day.  Past Medical History:  Diagnosis Date  . Anemia, chronic disease   . Anxiety   . Arthritis    knees  . Atrial fibrillation ([place])   . Barrett esophagus   . Benign paroxysmal positional vertigo   . Breast cancer ([place]) 1980s   bil mastectomies, no radiation or chemo  . Cataract   . Chest pain, atypical 12/05/2008   R/Lmv- normal perfusion all regions, noe ECG changes   . CHF (congestive heart failure) ([place]) 05/13/2011   echo - VC>94%; stage 1 diastolic dysfunction; elevated LV filling pressure, MAC  . CKD (chronic kidney disease), stage III ([place])   . Claudication ([place]) 10/30/2005   doppler - normal evaluation, no evidence of aneurysm, diameter reduction, dissection, compression or  vascular abnormality)  . Complication of anesthesia   . Dyslipidemia   . Dysrhythmia    Atrial Fibrillation  . Edema   . ESRD (end stage renal disease) ([place])   . Family history of adverse reaction to anesthesia    son also has nausea   . Fever blister 12/2016  . Gastritis and gastroduodenitis   . GERD (gastroesophageal reflux disease)   . Gout   . Hemodialysis-associated hypotension   . HLD (hyperlipidemia)   . Hypertension   . IBS (irritable bowel syndrome)   . Insomnia   . Morbid obesity ([place])   . PAF (paroxysmal atrial fibrillation) ([place])   . Peripheral neuropathy   . PONV (postoperative nausea and vomiting)   . Renal cyst   . Renal insufficiency 10/30/2005   doppler - abn resistance consistent w/ parenchymal disease  . Renovascular hypertension   . Type 2 diabetes mellitus with diabetic neuropathy, with long-term current use of insulin ([place])   . Vertigo      Family History  Problem Relation Age of Onset  . Diabetes Mother   . Heart disease Mother   . Non-Hodgkin's lymphoma Mother   . Heart attack Father   . Stroke Father   . Parkinson's disease Brother   . Lung disease Brother   . Heart disease Brother   . Multiple myeloma Sister      Social History  Socioeconomic History  . Marital status: Divorced    Spouse name: Not on file  . Number of children: Not on file  . Years of education: Not on file  . Highest education level: Not on file  Social Needs  . Financial resource strain: Not on file  . Food insecurity - worry: Not on file  . Food insecurity - inability: Not on file  . Transportation needs - medical: Not on file  . Transportation needs - non-medical: Not on file  Occupational History  . Not on file  Tobacco Use  . Smoking status: Never Smoker  . Smokeless tobacco: Never Used  Substance and Sexual Activity  . Alcohol use: No  . Drug use: No  . Sexual activity: Not on file  Other Topics Concern  . Not on file  Social History Narrative    [facility] (10/06/17):   Originally from [facility]. Has always lived in [place]. She currently lives in [place]. No bird exposure. [place] Previously traveled to [place], [place], & [place]. Previously worked in a nursery and also a Pharmacist, community.      Allergies  Allergen Reactions  . Erythromycin Swelling  . Erythromycin Base Other (See Comments)    Other reaction(s): GI Upset (intolerance)  . Penicillins Other (See Comments)    Unknown allergic reaction.  She has taken keflex and Rocephin many times without problems  . Codeine Palpitations, Rash and Other (See Comments)    Other reaction(s): GI Upset (intolerance)  . Penicillin G Rash     Outpatient Medications Prior to Visit  Medication Sig Dispense Refill  . acetaminophen (TYLENOL) 325 MG tablet Take 650 mg by mouth every 6 (six) hours as needed for mild pain or fever.     Marland Kitchen allopurinol (ZYLOPRIM) 100 MG tablet Take 100 mg by mouth daily.    Marland Kitchen amiodarone (PACERONE) 200 MG tablet Take 200 mg by mouth daily.     Marland Kitchen aspirin EC 81 MG tablet Take 81 mg by mouth daily.    . B Complex-C-Folic Acid (NEPHRO-VITE PO) Take 1 tablet by mouth at bedtime.     Marland Kitchen Dextran 70-Hypromellose (ARTIFICIAL TEARS PF OP) Place 1 drop into both eyes 3 (three) times daily as needed (for dry eyes).    . diazepam (VALIUM) 5 MG tablet Take one tablet by mouth every night at bedtime for rest (Patient taking differently: Take 5 mg by mouth at bedtime. ) 30 tablet 0  . dorzolamide-timolol (COSOPT) 22.3-6.8 MG/ML ophthalmic solution Place 1 drop into the left eye 2 (two) times daily. Wait 2-5 minutes when giving two eye meds    . fluticasone (FLONASE ALLERGY RELIEF) 50 MCG/ACT nasal spray Place 2 sprays into both nostrils daily.    . Hydrocortisone (GERHARDT'S BUTT CREAM) CREA Apply 1 application topically 3 (three) times daily.    Marland Kitchen loperamide (IMODIUM A-D) 2 MG tablet Take 2 mg by mouth 3 (three) times daily as needed for diarrhea or loose stools.    . Melatonin 5  MG TABS Take 5 mg by mouth at bedtime.     . metoprolol tartrate (LOPRESSOR) 25 MG tablet Take 0.5 tablets (12.5 mg total) by mouth 2 (two) times daily. Hold if sbp less than 100 or heart rate less than 50, hold on dialysis day. 60 tablet 0  . midodrine (PROAMATINE) 10 MG tablet Take 10 mg by mouth See admin instructions. 10 mg by mouth daily QD. Medication to be given to resident to  take while on dialysis  On Monday, Wednesday and Friday    . OXYGEN Inhale 2 L/min into the lungs. 24/7    . promethazine (PHENERGAN) 25 MG tablet Take 25 mg by mouth every 8 (eight) hours as needed for nausea or vomiting.    . ranitidine (ZANTAC) 150 MG tablet Take 1 tablet (150 mg total) by mouth every 12 (twelve) hours. 60 tablet 10  . traMADol (ULTRAM) 50 MG tablet Take 50 mg by mouth 3 (three) times daily as needed for moderate pain.     No facility-administered medications prior to visit.     Review of Systems  Constitutional: Positive for malaise/fatigue. Negative for chills.  HENT: Negative for congestion and sinus pain.   Respiratory: Positive for hemoptysis and shortness of breath. Negative for cough, wheezing and stridor.   Cardiovascular: Positive for leg swelling. Negative for palpitations and claudication.  Neurological: Negative for weakness.      Objective:  Physical Exam   Vitals:   02/04/18 1114  BP: 138/69  Pulse: (!) 59  SpO2: 100%  Weight: 161 lb (73 kg)  Height: 5' 3" (1.6 m)   2L   Gen: chronically ill appearing, in wheelchair HENT: OP clear, TM's clear, neck supple PULM: Diminished L base B, normal effort CV: RRR, no mgr, trace edema GI: BS+, soft, nontender Derm: no cyanosis or rash Psyche: normal mood and affect   CBC    Component Value Date/Time   WBC 9.7 12/26/2017 0007   RBC 3.38 (L) 12/26/2017 0007   HGB 12.6 01/19/2018 0804   HGB 13.0 09/06/2008 1552   HCT 37.0 01/19/2018 0804   HCT 38.3 09/06/2008 1552   PLT 229 12/26/2017 0007   PLT 188 09/06/2008  1552   MCV 103.8 (H) 12/26/2017 0007   MCV 95.6 04/02/2014 0908   MCV 95.0 09/06/2008 1552   MCH 31.7 12/26/2017 0007   MCHC 30.5 12/26/2017 0007   RDW 17.4 (H) 12/26/2017 0007   RDW 15.3 (H) 09/06/2008 1552   LYMPHSABS 3.4 12/26/2017 0007   LYMPHSABS 4.2 (H) 09/06/2008 1552   MONOABS 0.9 12/26/2017 0007   MONOABS 0.7 09/06/2008 1552   EOSABS 0.3 12/26/2017 0007   EOSABS 0.2 09/06/2008 1552   BASOSABS 0.1 12/26/2017 0007   BASOSABS 0.0 09/06/2008 1552     IMAGING PORT CXR 10/13/17:  Post thoracentesis. Tiny loculated left basilar pneumothorax and left apical pneumothorax. Minimal left basilar opacity persists.  CT CHEST W/O 10/05/17:  Small to moderate size free-flowing left pleural effusion noted. Adjacent compressed left lower lobe versus consolidation with air bronchograms noted. Borderline subcarinal lymphadenopathy is present. Patient does have multiple bilateral subcentimeter groundglass nodules. Notably there is some splenic calcification. Linear opacity that is primarily subpleural in the right base suggestive of scar tissue formation. No appreciated pericardial effusion. Compared with his previous CT scan from February 2018 the size of his left pleural effusion has significantly decreased in size. Additionally, there appeared to be mostly new groundglass nodules within the visualized right lung.  CT chest January 2019 images independently reviewed showing some scattered groundglass and solid nodules which have not changed compared to prior, dense consolidation/collapse of the left lower lobe with a left lung pleural effusion  CARDIAC TTE (09/22/17):  Mild LVH with EF 60-65% & normal regional wall motion. Grade 1 diastolic dysfunction with high ventricular filling pressure. LA & RA normal in size. RV normal in size and function. Pulmonary artery systolic pressure 34 mmHg. Poorly visualized aortic valve without obvious  stenosis or regurgitation. No mitral stenosis or regurgitation.  No pulmonic stenosis. Trivial tricuspid regurgitation. Trivial pericardial effusion. Left pleural effusion was noted.  LEFT Pleural Fluid Analysis (10/13/17) Volume: 400 mL Glucose: 124 LDH: 110 Total protein: 4.6 Albumin: 2.2 Lipase: 14 Amylase: 73 Triglycerides: 28 Cell count: 757 (42% lymphs, 3% neutrophils, 55% macrophages/monocytes) Culture: Negative Cytology: Reactive mesothelial cells. No malignancy.       Assessment & Plan:   Multiple lung nodules on CT  Recurrent left pleural effusion  Chronic respiratory failure with hypoxia (HCC)  Discussion: [name] James has a trapped left lung from a chronic pleural effusion which is presumably transdutative related to congestive heart failure.  The differential diagnosis includes prior parapneumonic effusion versus other inflammatory causes but I think this is less likely considering the lack of worsening over the last 4 years.  Further attempts at thoracentesis would be unsuccessful due to the entrapped lung.  So at this time I see no benefit to repeating thoracentesis.  I do not think this effusion will ever go away.  Her pulmonary nodules have not changed, I think the best approach is to get a repeat CT chest in January 2020 to make sure there is no interval development or change during that time.  In terms of her chronic respiratory failure with hypoxemia her oxygen levels normal today.  She has little desire to stop oxygen and I think considering her overall frail state it is best to just leave it in place for now.  Though if she remains stable this year we could consider stopping it.  Multiple pulmonary nodules: There is no change seen on the CT chest from January 2019 which is a good thing You need to have a repeat CT chest in January 2020 to make sure the nodules are not growing  Left-sided pleural effusion: At this time it is not clear to me what caused this, but if we were to try to draw the fluid off again fluid would just  reaccumulate If you have increasing shortness of breath please let us know and we can arrange for a chest x-ray  Chronic respiratory failure with hypoxemia: Continue 2L  continuously  We will see you back January 2020  > 50% of this 26 minute visit spent face to face    Current Outpatient Medications:  .  acetaminophen (TYLENOL) 325 MG tablet, Take 650 mg by mouth every 6 (six) hours as needed for mild pain or fever. , Disp: , Rfl:  .  allopurinol (ZYLOPRIM) 100 MG tablet, Take 100 mg by mouth daily., Disp: , Rfl:  .  amiodarone (PACERONE) 200 MG tablet, Take 200 mg by mouth daily. , Disp: , Rfl:  .  aspirin EC 81 MG tablet, Take 81 mg by mouth daily., Disp: , Rfl:  .  B Complex-C-Folic Acid (NEPHRO-VITE PO), Take 1 tablet by mouth at bedtime. , Disp: , Rfl:  .  Dextran 70-Hypromellose (ARTIFICIAL TEARS PF OP), Place 1 drop into both eyes 3 (three) times daily as needed (for dry eyes)., Disp: , Rfl:  .  diazepam (VALIUM) 5 MG tablet, Take one tablet by mouth every night at bedtime for rest (Patient taking differently: Take 5 mg by mouth at bedtime. ), Disp: 30 tablet, Rfl: 0 .  dorzolamide-timolol (COSOPT) 22.3-6.8 MG/ML ophthalmic solution, Place 1 drop into the left eye 2 (two) times daily. Wait 2-5 minutes when giving two eye meds, Disp: , Rfl:  .  fluticasone (FLONASE ALLERGY RELIEF) 50 MCG/ACT nasal spray, Place  2 sprays into both nostrils daily., Disp: , Rfl:  .  Hydrocortisone (GERHARDT'S BUTT CREAM) CREA, Apply 1 application topically 3 (three) times daily., Disp: , Rfl:  .  loperamide (IMODIUM A-D) 2 MG tablet, Take 2 mg by mouth 3 (three) times daily as needed for diarrhea or loose stools., Disp: , Rfl:  .  Melatonin 5 MG TABS, Take 5 mg by mouth at bedtime. , Disp: , Rfl:  .  metoprolol tartrate (LOPRESSOR) 25 MG tablet, Take 0.5 tablets (12.5 mg total) by mouth 2 (two) times daily. Hold if sbp less than 100 or heart rate less than 50, hold on dialysis day., Disp: 60 tablet,  Rfl: 0 .  midodrine (PROAMATINE) 10 MG tablet, Take 10 mg by mouth See admin instructions. 10 mg by mouth daily QD. Medication to be given to resident to take while on dialysis  On Monday, Wednesday and Friday, Disp: , Rfl:  .  OXYGEN, Inhale 2 L/min into the lungs. 24/7, Disp: , Rfl:  .  promethazine (PHENERGAN) 25 MG tablet, Take 25 mg by mouth every 8 (eight) hours as needed for nausea or vomiting., Disp: , Rfl:  .  ranitidine (ZANTAC) 150 MG tablet, Take 1 tablet (150 mg total) by mouth every 12 (twelve) hours., Disp: 60 tablet, Rfl: 10 .  traMADol (ULTRAM) 50 MG tablet, Take 50 mg by mouth 3 (three) times daily as needed for moderate pain., Disp: , Rfl:

## 2018-02-04 NOTE — Patient Instructions (Signed)
Multiple pulmonary nodules: There is no change seen on the CT chest from January 2019 which is a good thing You need to have a repeat CT chest in January 2020 to make sure the nodules are not growing  Left-sided pleural effusion: At this time it is not clear to me what caused this, but if we were to try to draw the fluid off again fluid would just reaccumulate If you have increasing shortness of breath please let us know and we can arrange for a chest x-ray  Chronic respiratory failure with hypoxemia: Continue 2L [place] continuously

## 2018-02-05 DIAGNOSIS — E1122 Type 2 diabetes mellitus with diabetic chronic kidney disease: Secondary | ICD-10-CM | POA: Diagnosis not present

## 2018-02-05 DIAGNOSIS — N2581 Secondary hyperparathyroidism of renal origin: Secondary | ICD-10-CM | POA: Diagnosis not present

## 2018-02-05 DIAGNOSIS — D631 Anemia in chronic kidney disease: Secondary | ICD-10-CM | POA: Diagnosis not present

## 2018-02-05 DIAGNOSIS — D509 Iron deficiency anemia, unspecified: Secondary | ICD-10-CM | POA: Diagnosis not present

## 2018-02-05 DIAGNOSIS — N186 End stage renal disease: Secondary | ICD-10-CM | POA: Diagnosis not present

## 2018-02-08 DIAGNOSIS — D509 Iron deficiency anemia, unspecified: Secondary | ICD-10-CM | POA: Diagnosis not present

## 2018-02-08 DIAGNOSIS — N2581 Secondary hyperparathyroidism of renal origin: Secondary | ICD-10-CM | POA: Diagnosis not present

## 2018-02-08 DIAGNOSIS — E1122 Type 2 diabetes mellitus with diabetic chronic kidney disease: Secondary | ICD-10-CM | POA: Diagnosis not present

## 2018-02-08 DIAGNOSIS — N186 End stage renal disease: Secondary | ICD-10-CM | POA: Diagnosis not present

## 2018-02-08 DIAGNOSIS — D631 Anemia in chronic kidney disease: Secondary | ICD-10-CM | POA: Diagnosis not present

## 2018-02-10 DIAGNOSIS — N186 End stage renal disease: Secondary | ICD-10-CM | POA: Diagnosis not present

## 2018-02-10 DIAGNOSIS — N2581 Secondary hyperparathyroidism of renal origin: Secondary | ICD-10-CM | POA: Diagnosis not present

## 2018-02-10 DIAGNOSIS — D631 Anemia in chronic kidney disease: Secondary | ICD-10-CM | POA: Diagnosis not present

## 2018-02-10 DIAGNOSIS — E1122 Type 2 diabetes mellitus with diabetic chronic kidney disease: Secondary | ICD-10-CM | POA: Diagnosis not present

## 2018-02-10 DIAGNOSIS — D509 Iron deficiency anemia, unspecified: Secondary | ICD-10-CM | POA: Diagnosis not present

## 2018-02-11 ENCOUNTER — Telehealth: Payer: Self-pay | Admitting: Internal Medicine

## 2018-02-11 DIAGNOSIS — K219 Gastro-esophageal reflux disease without esophagitis: Secondary | ICD-10-CM | POA: Diagnosis not present

## 2018-02-11 DIAGNOSIS — T50905A Adverse effect of unspecified drugs, medicaments and biological substances, initial encounter: Secondary | ICD-10-CM | POA: Diagnosis not present

## 2018-02-11 NOTE — Telephone Encounter (Signed)
Pt states the zantac she was taking was causing her to break out in a red rash. She has stopped the zantac and the rash has cleared. Pt states she is taking tums. States if she has problems she will call back and let us know.Dr. Hilarie Fredrickson notified.

## 2018-02-12 DIAGNOSIS — E1122 Type 2 diabetes mellitus with diabetic chronic kidney disease: Secondary | ICD-10-CM | POA: Diagnosis not present

## 2018-02-12 DIAGNOSIS — D509 Iron deficiency anemia, unspecified: Secondary | ICD-10-CM | POA: Diagnosis not present

## 2018-02-12 DIAGNOSIS — N2581 Secondary hyperparathyroidism of renal origin: Secondary | ICD-10-CM | POA: Diagnosis not present

## 2018-02-12 DIAGNOSIS — N186 End stage renal disease: Secondary | ICD-10-CM | POA: Diagnosis not present

## 2018-02-12 DIAGNOSIS — D631 Anemia in chronic kidney disease: Secondary | ICD-10-CM | POA: Diagnosis not present

## 2018-02-15 DIAGNOSIS — D500 Iron deficiency anemia secondary to blood loss (chronic): Secondary | ICD-10-CM | POA: Diagnosis not present

## 2018-02-15 DIAGNOSIS — D631 Anemia in chronic kidney disease: Secondary | ICD-10-CM | POA: Diagnosis not present

## 2018-02-15 DIAGNOSIS — D510 Vitamin B12 deficiency anemia due to intrinsic factor deficiency: Secondary | ICD-10-CM | POA: Diagnosis not present

## 2018-02-15 DIAGNOSIS — D509 Iron deficiency anemia, unspecified: Secondary | ICD-10-CM | POA: Diagnosis not present

## 2018-02-15 DIAGNOSIS — N2581 Secondary hyperparathyroidism of renal origin: Secondary | ICD-10-CM | POA: Diagnosis not present

## 2018-02-15 DIAGNOSIS — N186 End stage renal disease: Secondary | ICD-10-CM | POA: Diagnosis not present

## 2018-02-15 DIAGNOSIS — E1122 Type 2 diabetes mellitus with diabetic chronic kidney disease: Secondary | ICD-10-CM | POA: Diagnosis not present

## 2018-02-17 DIAGNOSIS — N186 End stage renal disease: Secondary | ICD-10-CM | POA: Diagnosis not present

## 2018-02-17 DIAGNOSIS — D649 Anemia, unspecified: Secondary | ICD-10-CM | POA: Diagnosis not present

## 2018-02-17 DIAGNOSIS — D509 Iron deficiency anemia, unspecified: Secondary | ICD-10-CM | POA: Diagnosis not present

## 2018-02-17 DIAGNOSIS — D631 Anemia in chronic kidney disease: Secondary | ICD-10-CM | POA: Diagnosis not present

## 2018-02-17 DIAGNOSIS — N2581 Secondary hyperparathyroidism of renal origin: Secondary | ICD-10-CM | POA: Diagnosis not present

## 2018-02-17 DIAGNOSIS — E1122 Type 2 diabetes mellitus with diabetic chronic kidney disease: Secondary | ICD-10-CM | POA: Diagnosis not present

## 2018-02-19 DIAGNOSIS — N2581 Secondary hyperparathyroidism of renal origin: Secondary | ICD-10-CM | POA: Diagnosis not present

## 2018-02-19 DIAGNOSIS — N186 End stage renal disease: Secondary | ICD-10-CM | POA: Diagnosis not present

## 2018-02-19 DIAGNOSIS — Z992 Dependence on renal dialysis: Secondary | ICD-10-CM | POA: Diagnosis not present

## 2018-02-19 DIAGNOSIS — E1122 Type 2 diabetes mellitus with diabetic chronic kidney disease: Secondary | ICD-10-CM | POA: Diagnosis not present

## 2018-02-22 DIAGNOSIS — N186 End stage renal disease: Secondary | ICD-10-CM | POA: Diagnosis not present

## 2018-02-22 DIAGNOSIS — E1122 Type 2 diabetes mellitus with diabetic chronic kidney disease: Secondary | ICD-10-CM | POA: Diagnosis not present

## 2018-02-22 DIAGNOSIS — N2581 Secondary hyperparathyroidism of renal origin: Secondary | ICD-10-CM | POA: Diagnosis not present

## 2018-02-24 DIAGNOSIS — E1122 Type 2 diabetes mellitus with diabetic chronic kidney disease: Secondary | ICD-10-CM | POA: Diagnosis not present

## 2018-02-24 DIAGNOSIS — N2581 Secondary hyperparathyroidism of renal origin: Secondary | ICD-10-CM | POA: Diagnosis not present

## 2018-02-24 DIAGNOSIS — N186 End stage renal disease: Secondary | ICD-10-CM | POA: Diagnosis not present

## 2018-02-26 DIAGNOSIS — E1122 Type 2 diabetes mellitus with diabetic chronic kidney disease: Secondary | ICD-10-CM | POA: Diagnosis not present

## 2018-02-26 DIAGNOSIS — N186 End stage renal disease: Secondary | ICD-10-CM | POA: Diagnosis not present

## 2018-02-26 DIAGNOSIS — N2581 Secondary hyperparathyroidism of renal origin: Secondary | ICD-10-CM | POA: Diagnosis not present

## 2018-03-01 DIAGNOSIS — N186 End stage renal disease: Secondary | ICD-10-CM | POA: Diagnosis not present

## 2018-03-01 DIAGNOSIS — E1122 Type 2 diabetes mellitus with diabetic chronic kidney disease: Secondary | ICD-10-CM | POA: Diagnosis not present

## 2018-03-01 DIAGNOSIS — N2581 Secondary hyperparathyroidism of renal origin: Secondary | ICD-10-CM | POA: Diagnosis not present

## 2018-03-01 IMAGING — DX DG CHEST 2V
2 series · 3 of 3 positions shown · non-contrast
Comparison: 10/13/2017

CLINICAL DATA: Followup left thoracentesis.

EXAM:
CHEST  2 VIEW

[Series 2: chest lat · 0.14mm/px · 2 of 2 slices shown]
[im 1/2]
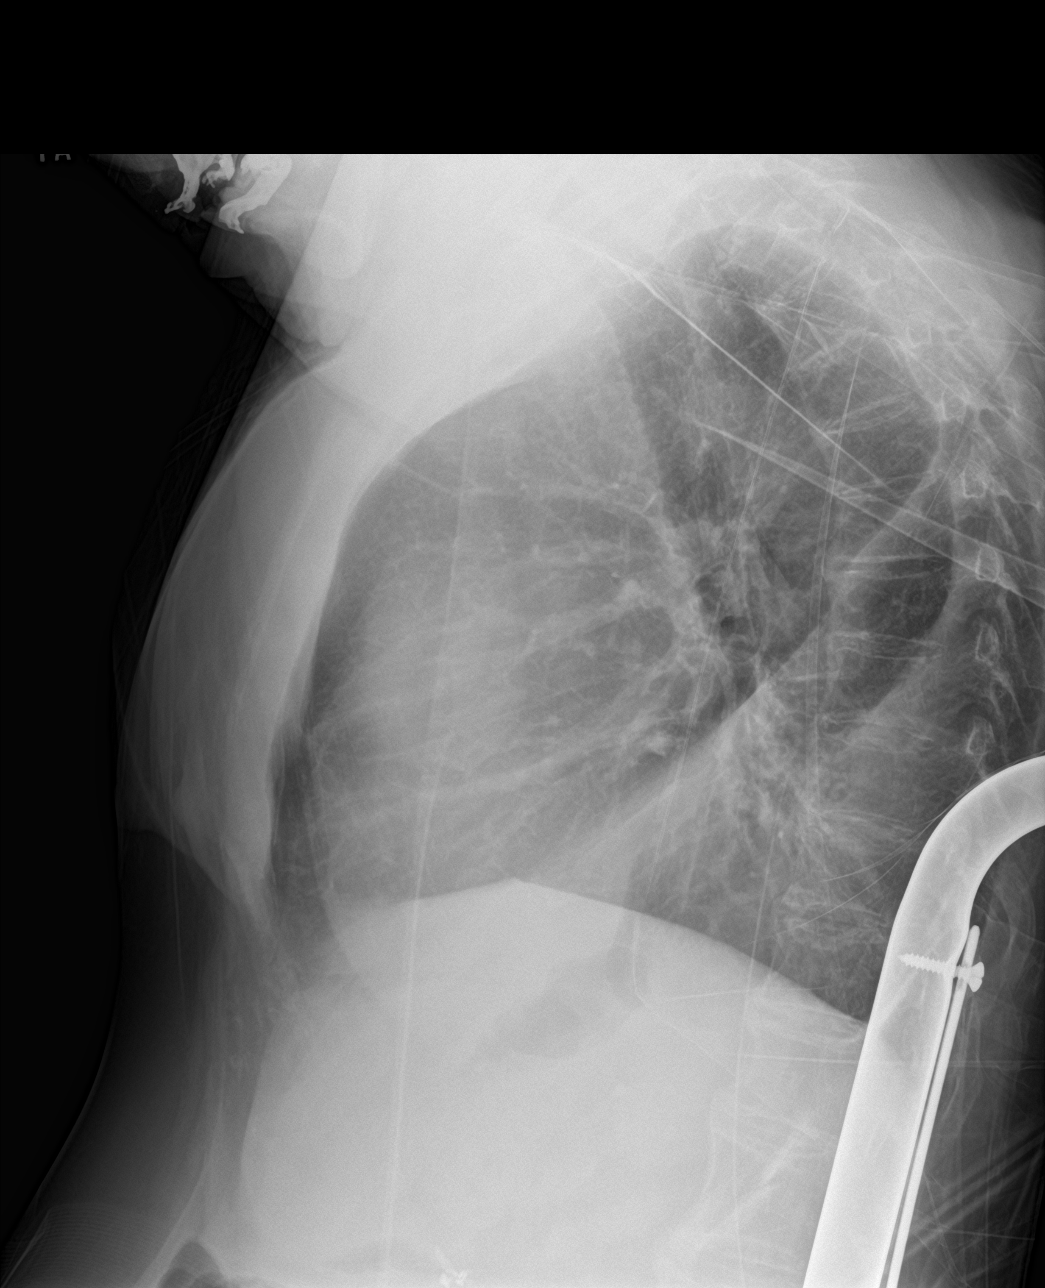
[im 2/2]
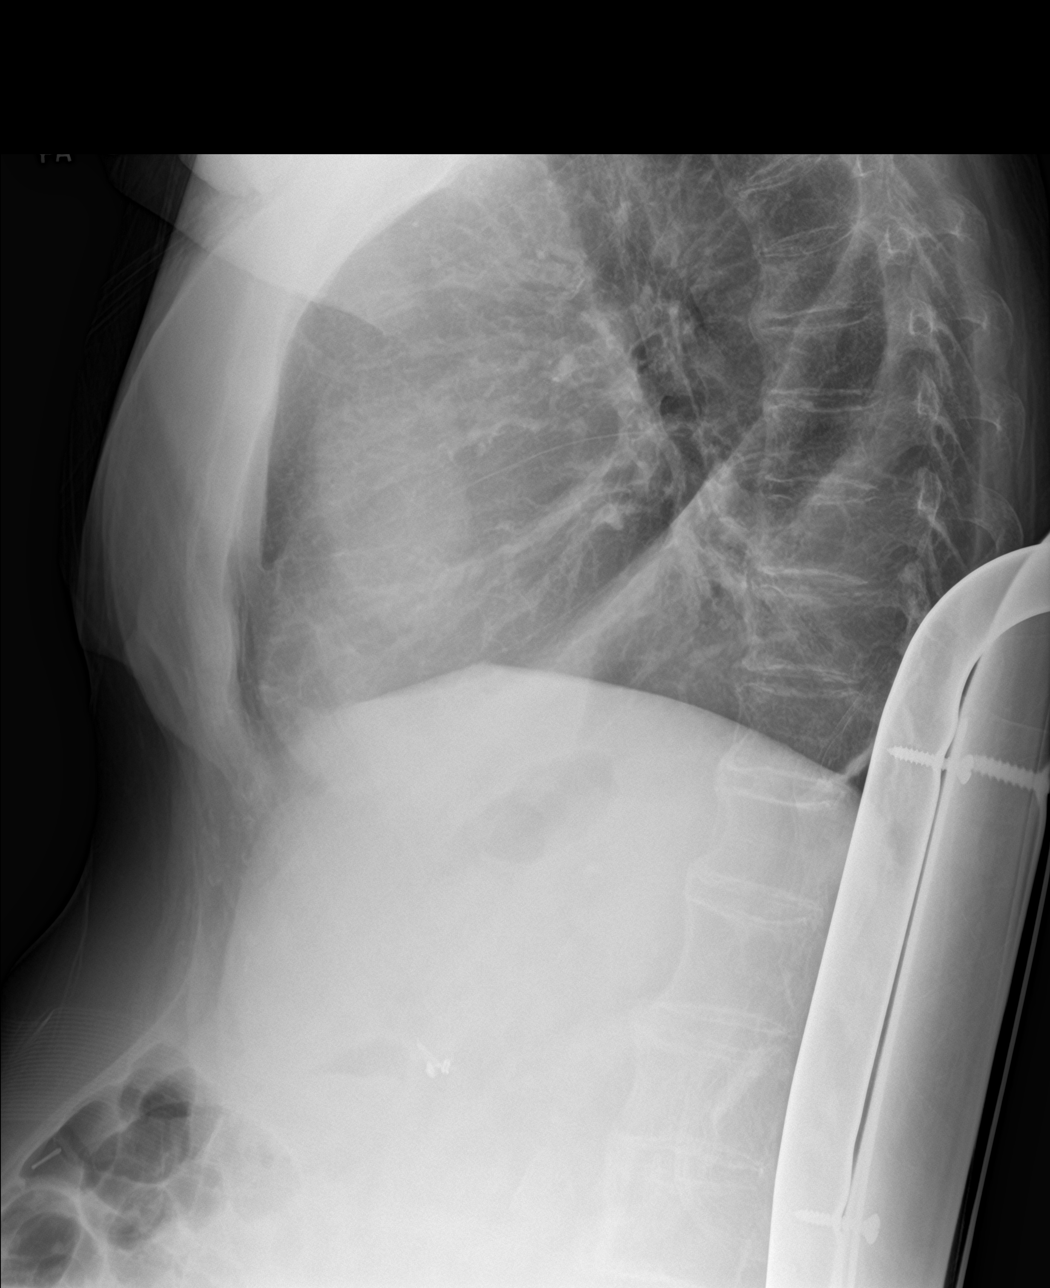

[chest ap]
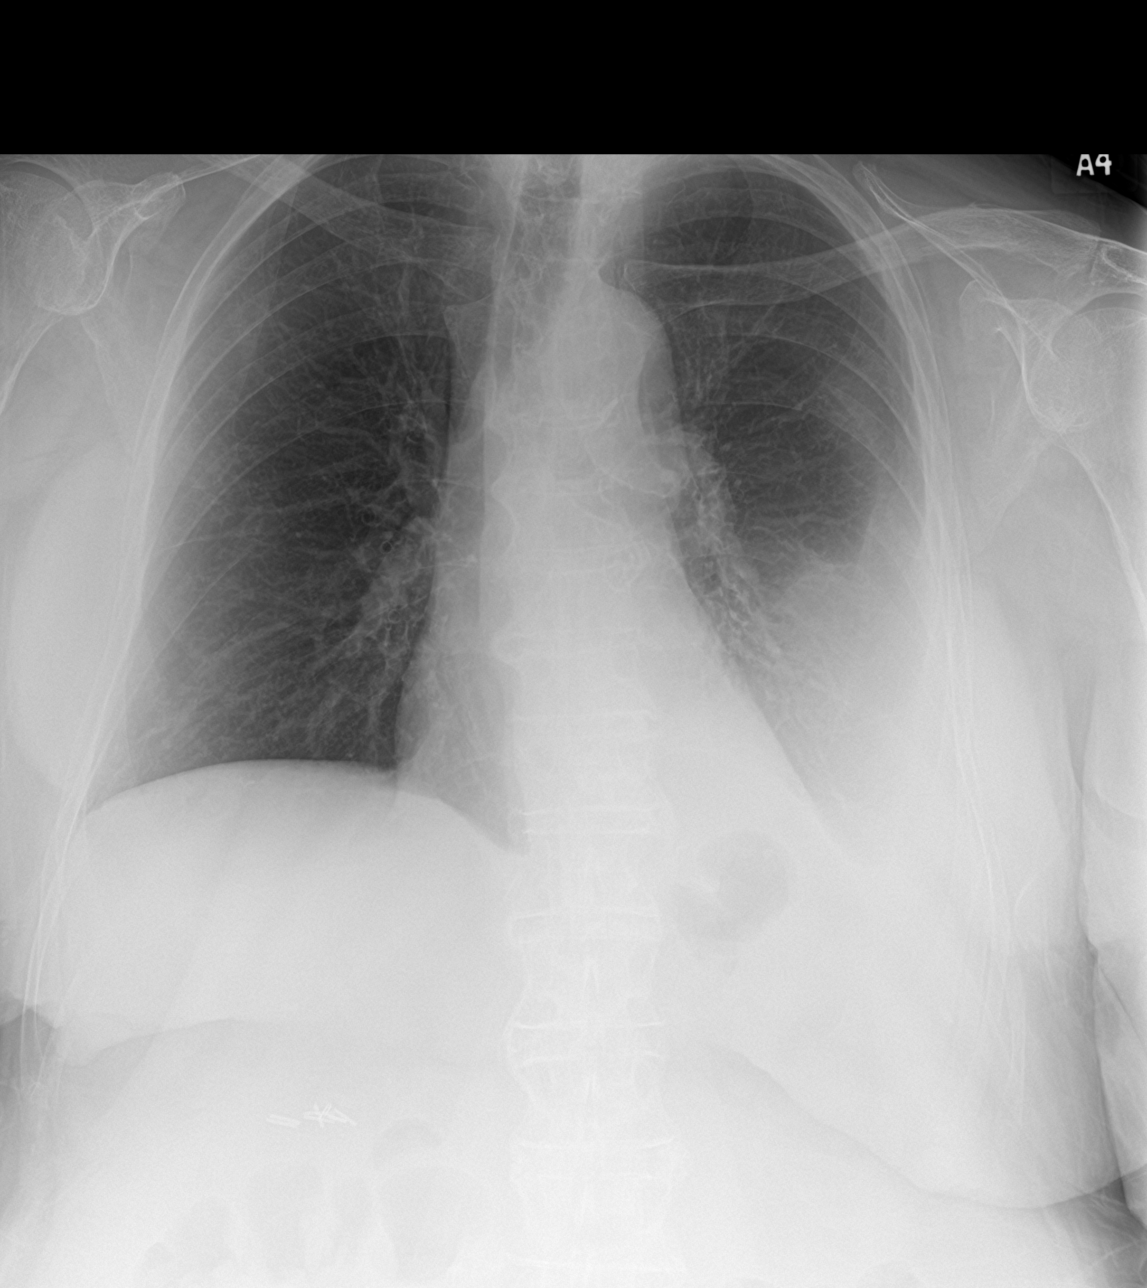

[3 of 3 positions shown; findings below may reference images not displayed]

FINDINGS: Right chest remains clear. There has been reaccumulation of pleural
fluid on the left with the fusion now moderate in size. There is
atelectasis of the lower left lung. Resolution of the previously
seen left pneumothorax. No acute bone finding.
IMPRESSION: Re accumulation of the pleural effusion on the left with left lower
lung atelectasis. No residual pleural air.

## 2018-03-03 ENCOUNTER — Encounter: Payer: Self-pay | Admitting: *Deleted

## 2018-03-03 ENCOUNTER — Ambulatory Visit (INDEPENDENT_AMBULATORY_CARE_PROVIDER_SITE_OTHER): Payer: Medicare Other | Admitting: Vascular Surgery

## 2018-03-03 ENCOUNTER — Encounter: Payer: Self-pay | Admitting: Vascular Surgery

## 2018-03-03 ENCOUNTER — Other Ambulatory Visit: Payer: Self-pay | Admitting: *Deleted

## 2018-03-03 VITALS — BP 158/72 | HR 61 | Temp 97.2°F | Resp 18 | Ht 63.0 in | Wt 161.0 lb

## 2018-03-03 DIAGNOSIS — Z992 Dependence on renal dialysis: Secondary | ICD-10-CM

## 2018-03-03 DIAGNOSIS — E1122 Type 2 diabetes mellitus with diabetic chronic kidney disease: Secondary | ICD-10-CM | POA: Diagnosis not present

## 2018-03-03 DIAGNOSIS — N186 End stage renal disease: Secondary | ICD-10-CM

## 2018-03-03 DIAGNOSIS — N2581 Secondary hyperparathyroidism of renal origin: Secondary | ICD-10-CM | POA: Diagnosis not present

## 2018-03-03 NOTE — H&P (View-Only) (Signed)
Patient name: [name] James MRN: [id number] DOB: May 13, 1936 Sex: female  REASON FOR VISIT:   Recurrent left arm swelling  HPI:   [name] James is a pleasant 82 y.o. female who has a left upper arm AV graft.  She developed significant arm swelling.  Of note she dialyzes on Monday Wednesday Fridays.  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  She underwent angioplasty of the left innominate vein and left subclavian vein.  After crossing the occlusion with a Glidewire he was able to perform primary balloon angioplasty with a 10 mm x 8 cm Mustang balloon.  He then went back with a 9 mm x 4 cm loop tonics drug-coated balloon.  This resulted in an excellent result with no residual stenosis.  The patient had improvement of her arm swelling which lasted about 3 weeks and then recurred.  She sent back for further evaluation.  She also tells me that some of the nurses are having a hard time sticking her graft.  She denies pain or paresthesias in the left arm.  Current Outpatient Medications  Medication Sig Dispense Refill  . acetaminophen (TYLENOL) 325 MG tablet Take 650 mg by mouth every 6 (six) hours as needed for mild pain or fever.     Marland Kitchen allopurinol (ZYLOPRIM) 100 MG tablet Take 100 mg by mouth daily.    Marland Kitchen amiodarone (PACERONE) 200 MG tablet Take 200 mg by mouth daily.     Marland Kitchen aspirin EC 81 MG tablet Take 81 mg by mouth daily.    . B Complex-C-Folic Acid (NEPHRO-VITE PO) Take 1 tablet by mouth at bedtime.     Marland Kitchen Dextran 70-Hypromellose (ARTIFICIAL TEARS PF OP) Place 1 drop into both eyes 3 (three) times daily as needed (for dry eyes).    . diazepam (VALIUM) 5 MG tablet Take one tablet by mouth every night at bedtime for rest (Patient taking differently: Take 5 mg by mouth at bedtime. ) 30 tablet 0  . dorzolamide-timolol (COSOPT) 22.3-6.8 MG/ML ophthalmic solution Place 1 drop into the left eye 2 (two) times daily. Wait 2-5 minutes when giving two eye  meds    . fluticasone (FLONASE ALLERGY RELIEF) 50 MCG/ACT nasal spray Place 2 sprays into both nostrils daily.    . Hydrocortisone (GERHARDT'S BUTT CREAM) CREA Apply 1 application topically 3 (three) times daily.    Marland Kitchen loperamide (IMODIUM A-D) 2 MG tablet Take 2 mg by mouth 3 (three) times daily as needed for diarrhea or loose stools.    . Melatonin 5 MG TABS Take 5 mg by mouth at bedtime.     . metoprolol tartrate (LOPRESSOR) 25 MG tablet Take 0.5 tablets (12.5 mg total) by mouth 2 (two) times daily. Hold if sbp less than 100 or heart rate less than 50, hold on dialysis day. 60 tablet 0  . midodrine (PROAMATINE) 10 MG tablet Take 10 mg by mouth See admin instructions. 10 mg by mouth daily QD. Medication to be given to resident to take while on dialysis  On Monday, Wednesday and Friday    . OXYGEN Inhale 2 L/min into the lungs. 24/7    . promethazine (PHENERGAN) 25 MG tablet Take 25 mg by mouth every 8 (eight) hours as needed for nausea or vomiting.    . ranitidine (ZANTAC) 150 MG tablet Take 1 tablet (150 mg total) by mouth every 12 (twelve) hours. 60 tablet 10  . traMADol (ULTRAM) 50 MG tablet Take 50  mg by mouth 3 (three) times daily as needed for moderate pain.     No current facility-administered medications for this visit.     REVIEW OF SYSTEMS:  [X]  denotes positive finding, [ ]  denotes negative finding Cardiac  Comments:  Chest pain or chest pressure:    Shortness of breath upon exertion:    Short of breath when lying flat:    Irregular heart rhythm:    Constitutional    Fever or chills:     PHYSICAL EXAM:   Vitals:   03/03/18 0833  BP: (!) 158/72  Pulse: 61  Resp: 18  Temp: (!) 97.2 F (36.2 C)  TempSrc: Oral  SpO2: 100%  Weight: 161 lb (73 kg)  Height: 5\' 3"  (1.6 m)    GENERAL: The patient is a well-nourished female, in no acute distress. The vital signs are documented above. CARDIOVASCULAR: There is a regular rate and rhythm. PULMONARY: There is good air exchange  bilaterally without wheezing or rales. Her graft has a palpable thrill although somewhat weak. She has significant left upper extremity swelling up to the shoulder.  DATA:   I reviewed her fistulogram which showed successful recanalization of an occluded left innominate and left subclavian vein.  MEDICAL ISSUES:   RECURRENT LEFT ARM SWELLING: This patient has significant recurrent swelling of the left arm.  I explained that the options would be either to ligate her graft and place new access in a catheter versus trying a follow-up fistulogram to see if there is any way to salvage the left arm graft.  If she has recurrent stenosis we could consider a covered stent possibly.  I will discuss this with Dr. Trula Slade.  She has a strong preference about doing this on Friday so I will schedule this on a Friday with me.  She will rearrange her dialysis schedule.  [name] [facility] [phone]

## 2018-03-03 NOTE — Progress Notes (Signed)
Patient name: [name] James MRN: [id number] DOB: 1936/08/07 Sex: female  REASON FOR VISIT:   Recurrent left arm swelling  HPI:   [name] James is a pleasant 82 y.o. female who has a left upper arm AV graft.  She developed significant arm swelling.  Of note she dialyzes on Monday Wednesday Fridays.  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  She underwent angioplasty of the left innominate vein and left subclavian vein.  After crossing the occlusion with a Glidewire he was able to perform primary balloon angioplasty with a 10 mm x 8 cm Mustang balloon.  He then went back with a 9 mm x 4 cm loop tonics drug-coated balloon.  This resulted in an excellent result with no residual stenosis.  The patient had improvement of her arm swelling which lasted about 3 weeks and then recurred.  She sent back for further evaluation.  She also tells me that some of the nurses are having a hard time sticking her graft.  She denies pain or paresthesias in the left arm.  Current Outpatient Medications  Medication Sig Dispense Refill  . acetaminophen (TYLENOL) 325 MG tablet Take 650 mg by mouth every 6 (six) hours as needed for mild pain or fever.     Marland Kitchen allopurinol (ZYLOPRIM) 100 MG tablet Take 100 mg by mouth daily.    Marland Kitchen amiodarone (PACERONE) 200 MG tablet Take 200 mg by mouth daily.     Marland Kitchen aspirin EC 81 MG tablet Take 81 mg by mouth daily.    . B Complex-C-Folic Acid (NEPHRO-VITE PO) Take 1 tablet by mouth at bedtime.     Marland Kitchen Dextran 70-Hypromellose (ARTIFICIAL TEARS PF OP) Place 1 drop into both eyes 3 (three) times daily as needed (for dry eyes).    . diazepam (VALIUM) 5 MG tablet Take one tablet by mouth every night at bedtime for rest (Patient taking differently: Take 5 mg by mouth at bedtime. ) 30 tablet 0  . dorzolamide-timolol (COSOPT) 22.3-6.8 MG/ML ophthalmic solution Place 1 drop into the left eye 2 (two) times daily. Wait 2-5 minutes when giving two eye  meds    . fluticasone (FLONASE ALLERGY RELIEF) 50 MCG/ACT nasal spray Place 2 sprays into both nostrils daily.    . Hydrocortisone (GERHARDT'S BUTT CREAM) CREA Apply 1 application topically 3 (three) times daily.    Marland Kitchen loperamide (IMODIUM A-D) 2 MG tablet Take 2 mg by mouth 3 (three) times daily as needed for diarrhea or loose stools.    . Melatonin 5 MG TABS Take 5 mg by mouth at bedtime.     . metoprolol tartrate (LOPRESSOR) 25 MG tablet Take 0.5 tablets (12.5 mg total) by mouth 2 (two) times daily. Hold if sbp less than 100 or heart rate less than 50, hold on dialysis day. 60 tablet 0  . midodrine (PROAMATINE) 10 MG tablet Take 10 mg by mouth See admin instructions. 10 mg by mouth daily QD. Medication to be given to resident to take while on dialysis  On Monday, Wednesday and Friday    . OXYGEN Inhale 2 L/min into the lungs. 24/7    . promethazine (PHENERGAN) 25 MG tablet Take 25 mg by mouth every 8 (eight) hours as needed for nausea or vomiting.    . ranitidine (ZANTAC) 150 MG tablet Take 1 tablet (150 mg total) by mouth every 12 (twelve) hours. 60 tablet 10  . traMADol (ULTRAM) 50 MG tablet Take 50  mg by mouth 3 (three) times daily as needed for moderate pain.     No current facility-administered medications for this visit.     REVIEW OF SYSTEMS:  [X]  denotes positive finding, [ ]  denotes negative finding Cardiac  Comments:  Chest pain or chest pressure:    Shortness of breath upon exertion:    Short of breath when lying flat:    Irregular heart rhythm:    Constitutional    Fever or chills:     PHYSICAL EXAM:   Vitals:   03/03/18 0833  BP: (!) 158/72  Pulse: 61  Resp: 18  Temp: (!) 97.2 F (36.2 C)  TempSrc: Oral  SpO2: 100%  Weight: 161 lb (73 kg)  Height: 5\' 3"  (1.6 m)    GENERAL: The patient is a well-nourished female, in no acute distress. The vital signs are documented above. CARDIOVASCULAR: There is a regular rate and rhythm. PULMONARY: There is good air exchange  bilaterally without wheezing or rales. Her graft has a palpable thrill although somewhat weak. She has significant left upper extremity swelling up to the shoulder.  DATA:   I reviewed her fistulogram which showed successful recanalization of an occluded left innominate and left subclavian vein.  MEDICAL ISSUES:   RECURRENT LEFT ARM SWELLING: This patient has significant recurrent swelling of the left arm.  I explained that the options would be either to ligate her graft and place new access in a catheter versus trying a follow-up fistulogram to see if there is any way to salvage the left arm graft.  If she has recurrent stenosis we could consider a covered stent possibly.  I will discuss this with Dr. Trula Slade.  She has a strong preference about doing this on Friday so I will schedule this on a Friday with me.  She will rearrange her dialysis schedule.  [name] [facility] [phone]

## 2018-03-05 DIAGNOSIS — E1122 Type 2 diabetes mellitus with diabetic chronic kidney disease: Secondary | ICD-10-CM | POA: Diagnosis not present

## 2018-03-05 DIAGNOSIS — N2581 Secondary hyperparathyroidism of renal origin: Secondary | ICD-10-CM | POA: Diagnosis not present

## 2018-03-05 DIAGNOSIS — N186 End stage renal disease: Secondary | ICD-10-CM | POA: Diagnosis not present

## 2018-03-07 DIAGNOSIS — R278 Other lack of coordination: Secondary | ICD-10-CM | POA: Diagnosis not present

## 2018-03-07 DIAGNOSIS — M6281 Muscle weakness (generalized): Secondary | ICD-10-CM | POA: Diagnosis not present

## 2018-03-08 DIAGNOSIS — N186 End stage renal disease: Secondary | ICD-10-CM | POA: Diagnosis not present

## 2018-03-08 DIAGNOSIS — N2581 Secondary hyperparathyroidism of renal origin: Secondary | ICD-10-CM | POA: Diagnosis not present

## 2018-03-08 DIAGNOSIS — E1122 Type 2 diabetes mellitus with diabetic chronic kidney disease: Secondary | ICD-10-CM | POA: Diagnosis not present

## 2018-03-09 DIAGNOSIS — R278 Other lack of coordination: Secondary | ICD-10-CM | POA: Diagnosis not present

## 2018-03-09 DIAGNOSIS — M6281 Muscle weakness (generalized): Secondary | ICD-10-CM | POA: Diagnosis not present

## 2018-03-10 DIAGNOSIS — E1122 Type 2 diabetes mellitus with diabetic chronic kidney disease: Secondary | ICD-10-CM | POA: Diagnosis not present

## 2018-03-10 DIAGNOSIS — N2581 Secondary hyperparathyroidism of renal origin: Secondary | ICD-10-CM | POA: Diagnosis not present

## 2018-03-10 DIAGNOSIS — N186 End stage renal disease: Secondary | ICD-10-CM | POA: Diagnosis not present

## 2018-03-12 DIAGNOSIS — N186 End stage renal disease: Secondary | ICD-10-CM | POA: Diagnosis not present

## 2018-03-12 DIAGNOSIS — E1122 Type 2 diabetes mellitus with diabetic chronic kidney disease: Secondary | ICD-10-CM | POA: Diagnosis not present

## 2018-03-12 DIAGNOSIS — N2581 Secondary hyperparathyroidism of renal origin: Secondary | ICD-10-CM | POA: Diagnosis not present

## 2018-03-15 DIAGNOSIS — N186 End stage renal disease: Secondary | ICD-10-CM | POA: Diagnosis not present

## 2018-03-15 DIAGNOSIS — E1122 Type 2 diabetes mellitus with diabetic chronic kidney disease: Secondary | ICD-10-CM | POA: Diagnosis not present

## 2018-03-15 DIAGNOSIS — N2581 Secondary hyperparathyroidism of renal origin: Secondary | ICD-10-CM | POA: Diagnosis not present

## 2018-03-17 DIAGNOSIS — E1122 Type 2 diabetes mellitus with diabetic chronic kidney disease: Secondary | ICD-10-CM | POA: Diagnosis not present

## 2018-03-17 DIAGNOSIS — N2581 Secondary hyperparathyroidism of renal origin: Secondary | ICD-10-CM | POA: Diagnosis not present

## 2018-03-17 DIAGNOSIS — N186 End stage renal disease: Secondary | ICD-10-CM | POA: Diagnosis not present

## 2018-03-18 ENCOUNTER — Telehealth: Payer: Self-pay | Admitting: Cardiovascular Disease

## 2018-03-18 DIAGNOSIS — E1122 Type 2 diabetes mellitus with diabetic chronic kidney disease: Secondary | ICD-10-CM | POA: Diagnosis not present

## 2018-03-18 DIAGNOSIS — N2581 Secondary hyperparathyroidism of renal origin: Secondary | ICD-10-CM | POA: Diagnosis not present

## 2018-03-18 DIAGNOSIS — N186 End stage renal disease: Secondary | ICD-10-CM | POA: Diagnosis not present

## 2018-03-18 NOTE — Telephone Encounter (Signed)
Closed Encounter  °

## 2018-03-19 ENCOUNTER — Encounter (HOSPITAL_COMMUNITY): Payer: Self-pay | Admitting: Vascular Surgery

## 2018-03-19 ENCOUNTER — Encounter (HOSPITAL_COMMUNITY)
Admission: RE | Disposition: A | Discharge status: Home / Self Care | Payer: Self-pay | Source: Ambulatory Visit | Attending: Vascular Surgery

## 2018-03-19 ENCOUNTER — Ambulatory Visit (HOSPITAL_COMMUNITY)
Admission: RE | Admit: 2018-03-19 | Discharge: 2018-03-19 | Disposition: A | Discharge status: Home / Self Care | Payer: Medicare Other | Source: Ambulatory Visit | Attending: Vascular Surgery | Admitting: Vascular Surgery

## 2018-03-19 DIAGNOSIS — Z79899 Other long term (current) drug therapy: Secondary | ICD-10-CM | POA: Insufficient documentation

## 2018-03-19 DIAGNOSIS — Z7982 Long term (current) use of aspirin: Secondary | ICD-10-CM | POA: Insufficient documentation

## 2018-03-19 DIAGNOSIS — Z992 Dependence on renal dialysis: Secondary | ICD-10-CM

## 2018-03-19 DIAGNOSIS — Y832 Surgical operation with anastomosis, bypass or graft as the cause of abnormal reaction of the patient, or of later complication, without mention of misadventure at the time of the procedure: Secondary | ICD-10-CM | POA: Diagnosis not present

## 2018-03-19 DIAGNOSIS — T82858A Stenosis of vascular prosthetic devices, implants and grafts, initial encounter: Secondary | ICD-10-CM | POA: Diagnosis not present

## 2018-03-19 DIAGNOSIS — N186 End stage renal disease: Secondary | ICD-10-CM | POA: Diagnosis not present

## 2018-03-19 DIAGNOSIS — T82898A Other specified complication of vascular prosthetic devices, implants and grafts, initial encounter: Secondary | ICD-10-CM | POA: Diagnosis not present

## 2018-03-19 HISTORY — PX: A/V FISTULAGRAM: CATH118298

## 2018-03-19 HISTORY — PX: PERIPHERAL VASCULAR BALLOON ANGIOPLASTY: CATH118281

## 2018-03-19 LAB — POCT I-STAT, CHEM 8
BUN: 16 mg/dL (ref 6–20)
Calcium, Ion: 0.88 mmol/L — CL (ref 1.15–1.40)
Chloride: 98 mmol/L — ABNORMAL LOW (ref 101–111)
Creatinine, Ser: 2.9 mg/dL — ABNORMAL HIGH (ref 0.44–1.00)
Glucose, Bld: 132 mg/dL — ABNORMAL HIGH (ref 65–99)
HEMATOCRIT: 49 % — AB (ref 36.0–46.0)
Hemoglobin: 16.7 g/dL — ABNORMAL HIGH (ref 12.0–15.0)
POTASSIUM: 2.9 mmol/L — AB (ref 3.5–5.1)
SODIUM: 138 mmol/L (ref 135–145)
TCO2: 29 mmol/L (ref 22–32)

## 2018-03-19 SURGERY — A/V FISTULAGRAM
Anesthesia: LOCAL | Laterality: Left

## 2018-03-19 MED ORDER — SODIUM CHLORIDE 0.9 % IV SOLN: 250.0000 mL | INTRAVENOUS | Status: DC | PRN

## 2018-03-19 MED ORDER — FENTANYL CITRATE (PF) 100 MCG/2ML IJ SOLN
INTRAMUSCULAR | Status: AC
  Filled 2018-03-19: qty 2

## 2018-03-19 MED ORDER — FENTANYL CITRATE (PF) 100 MCG/2ML IJ SOLN
INTRAMUSCULAR | Status: DC | PRN
  Administered 2018-03-19: 25 ug via INTRAVENOUS

## 2018-03-19 MED ORDER — LIDOCAINE HCL 1 % IJ SOLN
INTRAMUSCULAR | Status: AC
  Filled 2018-03-19: qty 20

## 2018-03-19 MED ORDER — LIDOCAINE HCL (PF) 1 % IJ SOLN
INTRAMUSCULAR | Status: DC | PRN
  Administered 2018-03-19: 5 mL

## 2018-03-19 MED ORDER — MIDAZOLAM HCL 2 MG/2ML IJ SOLN
INTRAMUSCULAR | Status: DC | PRN
  Administered 2018-03-19: 0.5 mg via INTRAVENOUS

## 2018-03-19 MED ORDER — HEPARIN (PORCINE) IN NACL 2-0.9 UNIT/ML-% IJ SOLN
INTRAMUSCULAR | Status: AC | PRN
  Administered 2018-03-19: 500 mL

## 2018-03-19 MED ORDER — IODIXANOL 320 MG/ML IV SOLN
INTRAVENOUS | Status: DC | PRN
  Administered 2018-03-19: 30 mL via INTRAVENOUS

## 2018-03-19 MED ORDER — SODIUM CHLORIDE 0.9% FLUSH: 3.0000 mL | INTRAVENOUS | Status: DC | PRN

## 2018-03-19 MED ORDER — HEPARIN SODIUM (PORCINE) 1000 UNIT/ML IJ SOLN
INTRAMUSCULAR | Status: AC
  Filled 2018-03-19: qty 1

## 2018-03-19 MED ORDER — HEPARIN (PORCINE) IN NACL 2-0.9 UNIT/ML-% IJ SOLN
INTRAMUSCULAR | Status: AC
  Filled 2018-03-19: qty 500

## 2018-03-19 MED ORDER — SODIUM CHLORIDE 0.9% FLUSH: 3.0000 mL | Freq: Two times a day (BID) | INTRAVENOUS | Status: DC

## 2018-03-19 MED ORDER — MIDAZOLAM HCL 2 MG/2ML IJ SOLN
INTRAMUSCULAR | Status: AC
  Filled 2018-03-19: qty 2

## 2018-03-19 SURGICAL SUPPLY — 20 items
BAG SNAP BAND KOVER 36X36 (MISCELLANEOUS) ×2 IMPLANT
BALLN LUTONIX AV 9X60X75 (BALLOONS) ×2
BALLN MUSTANG 8.0X40 75 (BALLOONS) ×2
BALLOON LUTONIX AV 9X60X75 (BALLOONS) IMPLANT
BALLOON MUSTANG 8.0X40 75 (BALLOONS) IMPLANT
CATH STRAIGHT 5FR 65CM (CATHETERS) ×1 IMPLANT
COVER DOME SNAP 22 D (MISCELLANEOUS) ×2 IMPLANT
COVER PRB 48X5XTLSCP FOLD TPE (BAG) ×1 IMPLANT
COVER PROBE 5X48 (BAG) ×2
KIT ENCORE 26 ADVANTAGE (KITS) ×1 IMPLANT
KIT MICROPUNCTURE NIT STIFF (SHEATH) ×1 IMPLANT
PROTECTION STATION PRESSURIZED (MISCELLANEOUS) ×2
SHEATH PINNACLE R/O II 6F 4CM (SHEATH) ×1 IMPLANT
SHEATH PINNACLE R/O II 7F 4CM (SHEATH) ×1 IMPLANT
STATION PROTECTION PRESSURIZED (MISCELLANEOUS) ×1 IMPLANT
STOPCOCK MORSE 400PSI 3WAY (MISCELLANEOUS) ×2 IMPLANT
TRAY PV CATH (CUSTOM PROCEDURE TRAY) ×2 IMPLANT
TUBING CIL FLEX 10 FLL-RA (TUBING) ×2 IMPLANT
WIRE AQUATRAK .035X150 ANG (WIRE) ×1 IMPLANT
WIRE BENTSON .035X145CM (WIRE) ×1 IMPLANT

## 2018-03-19 NOTE — Op Note (Signed)
   PATIENT: [name] James      MRN: [id number] DOB: June 21, 1936    DATE OF PROCEDURE: 03/19/2018  INDICATIONS:    [name] James is a 82 y.o. female with a poorly functioning left upper arm graft and left arm swelling.  She is undergone previous venoplasty.  She presents with a recurrent occlusion.  PROCEDURE:    1.  Ultrasound-guided access to the left upper arm AV graft 2.  Conscious sedation 3.  Venoplasty of stenosis of left subclavian vein and left brachial vein 4.  Drug-coated balloon angioplasty of left subclavian vein   SURGEON: Judeth Cornfield. Scot Dock, MD, FACS  ANESTHESIA: Local with sedation  EBL: Minimal  TECHNIQUE: The patient was taken to the peripheral vascular lab and was sedated. The period of conscious sedation was 34 minutes.  During that time period, I was present face-to-face 100% of the time.  The patient was administered half a milligram of Versed and 25 mcg of fentanyl. The patient's heart rate, blood pressure, and oxygen saturation were monitored by the nurse continuously during the procedure.  The left arm was prepped and draped in usual sterile fashion.  Under ultrasound guidance, after the skin was anesthetized, the left upper arm AV graft was cannulated with a micropuncture needle and a micropuncture sheath introduced over the wire.  Fistulogram was then obtained which showed a short segment occlusion of the subclavian vein beneath the clavicle.  There was also an 80% stenosis in the brachial vein further peripherally.  I elected to address these with venoplasty.  The patient was then heparinized.  The micropuncture sheath was exchanged for a short 6 [place] sheath.  I initially selected an 8 mm x 4 cm balloon.  This was positioned across both stenoses and inflated to nominal pressure for 1 minute.  Follow-up film showed some residual stenosis in both areas.  I therefore upsized to an 7 [place] sheath over the [place] wire.  I then selected a Lutonix 9 x 6  drug-coated balloon which was positioned across the stenosis beneath the clavicle and inflated to 11 atm for 3 minutes.  This was then brought down to the more peripheral stenosis and inflated to 11 atm for 1 minute.  Completion film showed no residual stenosis with an excellent result.  A 4-0 Monocryl was placed around the cannulation site and the sheath removed without difficulty.  FINDINGS:    1.  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  This was successfully addressed with drug-coated balloon angioplasty. 2.  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  This was also successfully addressed with venoplasty.  CLINICAL NOTE: The stenosis in the subclavian vein is between the first rib and the clavicle therefore I did not think the patient is a candidate for a stent.  If this recurs I think the patient will likely have to be evaluated for new access.  [name], MD, FACS [facility]  DATE OF DICTATION:   03/19/2018

## 2018-03-19 NOTE — Progress Notes (Addendum)
CRITICAL VALUE ALERT  Critical value received:  iCa 0.88  Date of notification: 3/29  Time of notification:  0837  Critical value read back Y  Nurse who received alert:  Irving Burton  MD notified (1st page):  Dr Scot Dock  Time of first page:  [phone]  MD notified (2nd page):  Time of second page:  Responding MD:  Dr Scot Dock; no new orders  Time MD responded:  [phone]

## 2018-03-19 NOTE — Interval H&P Note (Signed)
History and Physical Interval Note:  03/19/2018 9:09 AM  [name] James  has presented today for surgery, with the diagnosis of esrd  The various methods of treatment have been discussed with the patient and family. After consideration of risks, benefits and other options for treatment, the patient has consented to  Procedure(s): A/V FISTULAGRAM (Left) as a surgical intervention .  The patient's history has been reviewed, patient examined, no change in status, stable for surgery.  I have reviewed the patient's chart and labs.  Questions were answered to the patient's satisfaction.     [name]

## 2018-03-19 NOTE — Discharge Instructions (Addendum)

## 2018-03-22 DIAGNOSIS — N2581 Secondary hyperparathyroidism of renal origin: Secondary | ICD-10-CM | POA: Diagnosis not present

## 2018-03-22 DIAGNOSIS — E1122 Type 2 diabetes mellitus with diabetic chronic kidney disease: Secondary | ICD-10-CM | POA: Diagnosis not present

## 2018-03-22 DIAGNOSIS — Z992 Dependence on renal dialysis: Secondary | ICD-10-CM | POA: Diagnosis not present

## 2018-03-22 DIAGNOSIS — N186 End stage renal disease: Secondary | ICD-10-CM | POA: Diagnosis not present

## 2018-03-22 DIAGNOSIS — D631 Anemia in chronic kidney disease: Secondary | ICD-10-CM | POA: Diagnosis not present

## 2018-03-22 MED FILL — Heparin Sodium (Porcine) Inj 1000 Unit/ML: INTRAMUSCULAR | Qty: 10 | Status: AC

## 2018-03-22 MED FILL — Lidocaine HCl Local Inj 1%: INTRAMUSCULAR | Qty: 20 | Status: AC

## 2018-03-23 DIAGNOSIS — F419 Anxiety disorder, unspecified: Secondary | ICD-10-CM | POA: Diagnosis not present

## 2018-03-23 DIAGNOSIS — I4891 Unspecified atrial fibrillation: Secondary | ICD-10-CM | POA: Diagnosis not present

## 2018-03-23 DIAGNOSIS — N186 End stage renal disease: Secondary | ICD-10-CM | POA: Diagnosis not present

## 2018-03-23 DIAGNOSIS — J309 Allergic rhinitis, unspecified: Secondary | ICD-10-CM | POA: Diagnosis not present

## 2018-03-24 DIAGNOSIS — N2581 Secondary hyperparathyroidism of renal origin: Secondary | ICD-10-CM | POA: Diagnosis not present

## 2018-03-24 DIAGNOSIS — D631 Anemia in chronic kidney disease: Secondary | ICD-10-CM | POA: Diagnosis not present

## 2018-03-24 DIAGNOSIS — E1122 Type 2 diabetes mellitus with diabetic chronic kidney disease: Secondary | ICD-10-CM | POA: Diagnosis not present

## 2018-03-24 DIAGNOSIS — N186 End stage renal disease: Secondary | ICD-10-CM | POA: Diagnosis not present

## 2018-03-25 DIAGNOSIS — N390 Urinary tract infection, site not specified: Secondary | ICD-10-CM | POA: Diagnosis not present

## 2018-03-26 DIAGNOSIS — N2581 Secondary hyperparathyroidism of renal origin: Secondary | ICD-10-CM | POA: Diagnosis not present

## 2018-03-26 DIAGNOSIS — E1122 Type 2 diabetes mellitus with diabetic chronic kidney disease: Secondary | ICD-10-CM | POA: Diagnosis not present

## 2018-03-26 DIAGNOSIS — D631 Anemia in chronic kidney disease: Secondary | ICD-10-CM | POA: Diagnosis not present

## 2018-03-26 DIAGNOSIS — N186 End stage renal disease: Secondary | ICD-10-CM | POA: Diagnosis not present

## 2018-03-29 DIAGNOSIS — N2581 Secondary hyperparathyroidism of renal origin: Secondary | ICD-10-CM | POA: Diagnosis not present

## 2018-03-29 DIAGNOSIS — D631 Anemia in chronic kidney disease: Secondary | ICD-10-CM | POA: Diagnosis not present

## 2018-03-29 DIAGNOSIS — E1122 Type 2 diabetes mellitus with diabetic chronic kidney disease: Secondary | ICD-10-CM | POA: Diagnosis not present

## 2018-03-29 DIAGNOSIS — N186 End stage renal disease: Secondary | ICD-10-CM | POA: Diagnosis not present

## 2018-03-30 DIAGNOSIS — J069 Acute upper respiratory infection, unspecified: Secondary | ICD-10-CM | POA: Diagnosis not present

## 2018-03-31 DIAGNOSIS — D631 Anemia in chronic kidney disease: Secondary | ICD-10-CM | POA: Diagnosis not present

## 2018-03-31 DIAGNOSIS — N186 End stage renal disease: Secondary | ICD-10-CM | POA: Diagnosis not present

## 2018-03-31 DIAGNOSIS — E1122 Type 2 diabetes mellitus with diabetic chronic kidney disease: Secondary | ICD-10-CM | POA: Diagnosis not present

## 2018-03-31 DIAGNOSIS — N2581 Secondary hyperparathyroidism of renal origin: Secondary | ICD-10-CM | POA: Diagnosis not present

## 2018-04-01 ENCOUNTER — Ambulatory Visit: Payer: Medicare Other | Admitting: Cardiovascular Disease

## 2018-04-02 DIAGNOSIS — N186 End stage renal disease: Secondary | ICD-10-CM | POA: Diagnosis not present

## 2018-04-02 DIAGNOSIS — N2581 Secondary hyperparathyroidism of renal origin: Secondary | ICD-10-CM | POA: Diagnosis not present

## 2018-04-02 DIAGNOSIS — D631 Anemia in chronic kidney disease: Secondary | ICD-10-CM | POA: Diagnosis not present

## 2018-04-02 DIAGNOSIS — E1122 Type 2 diabetes mellitus with diabetic chronic kidney disease: Secondary | ICD-10-CM | POA: Diagnosis not present

## 2018-04-05 DIAGNOSIS — N2581 Secondary hyperparathyroidism of renal origin: Secondary | ICD-10-CM | POA: Diagnosis not present

## 2018-04-05 DIAGNOSIS — D631 Anemia in chronic kidney disease: Secondary | ICD-10-CM | POA: Diagnosis not present

## 2018-04-05 DIAGNOSIS — E1122 Type 2 diabetes mellitus with diabetic chronic kidney disease: Secondary | ICD-10-CM | POA: Diagnosis not present

## 2018-04-05 DIAGNOSIS — N186 End stage renal disease: Secondary | ICD-10-CM | POA: Diagnosis not present

## 2018-04-06 ENCOUNTER — Ambulatory Visit (INDEPENDENT_AMBULATORY_CARE_PROVIDER_SITE_OTHER): Payer: Medicare Other | Admitting: Cardiology

## 2018-04-06 ENCOUNTER — Encounter: Payer: Self-pay | Admitting: Cardiology

## 2018-04-06 VITALS — BP 110/56 | HR 61 | Ht 64.5 in

## 2018-04-06 DIAGNOSIS — J9611 Chronic respiratory failure with hypoxia: Secondary | ICD-10-CM | POA: Diagnosis not present

## 2018-04-06 DIAGNOSIS — I10 Essential (primary) hypertension: Secondary | ICD-10-CM | POA: Diagnosis not present

## 2018-04-06 DIAGNOSIS — I5022 Chronic systolic (congestive) heart failure: Secondary | ICD-10-CM | POA: Diagnosis not present

## 2018-04-06 DIAGNOSIS — N186 End stage renal disease: Secondary | ICD-10-CM

## 2018-04-06 DIAGNOSIS — I480 Paroxysmal atrial fibrillation: Secondary | ICD-10-CM | POA: Diagnosis not present

## 2018-04-06 DIAGNOSIS — Z992 Dependence on renal dialysis: Secondary | ICD-10-CM | POA: Diagnosis not present

## 2018-04-06 NOTE — Patient Instructions (Signed)
Medication Instructions:  Your physician recommends that you continue on your current medications as directed. Please refer to the Current Medication list given to you today.  Labwork: Your physician recommends that you return for lab work in: COMPLETE AT DIALYSIS-TSH, LFT  Testing/Procedures: NONE  Follow-Up: Your physician wants you to follow-up in: 6 months with Dr Sallyanne Kuster.  You will receive a reminder letter in the mail two months in advance. If you don't receive a letter, please call our office to schedule the follow-up appointment.  Any Other Special Instructions Will Be Listed Below (If Applicable). If you need a refill on your cardiac medications before your next appointment, please call your pharmacy.

## 2018-04-06 NOTE — Assessment & Plan Note (Signed)
Holding NSR on Amiodarone 200 mg. Not anticoagulated secondary to h/o GI bleeding

## 2018-04-06 NOTE — Progress Notes (Signed)
04/06/2018 [name] James   December 14, 1936  [id number]  Primary Physician [name], DO Primary Cardiologist: Dr Sallyanne Kuster  HPI:  82 y.o. female with a hx of fPAF  in the setting of acute illness in 2015. She is not on anticoagulation secondary to GI bleeding. She is on chronic Os and is a resident of [place].  She has diabetes mellitus, end-stage renal disease, known aortic and coronary atherosclerosis, but with a normal nuclear stress test and normal left ventricular ejection fraction 2015. Echo described "moderate" LVH, normal LVEF, normal left atrial size She has been on amiodarone without any major episodes of atrial fibrillation since 2015. She is in the office today for 6 month follow up. Since we saw her last she has had AVF revision twice. She has no cardiac complaints.    Current Outpatient Medications  Medication Sig Dispense Refill  . acetaminophen (TYLENOL) 325 MG tablet Take 650 mg by mouth every 6 (six) hours as needed for mild pain or fever.     Marland Kitchen allopurinol (ZYLOPRIM) 100 MG tablet Take 100 mg by mouth daily.    Marland Kitchen amiodarone (PACERONE) 200 MG tablet Take 200 mg by mouth daily.     Marland Kitchen aspirin EC 81 MG tablet Take 81 mg by mouth daily.    . B Complex-C-Folic Acid (NEPHRO-VITE PO) Take 1 tablet by mouth daily.     . calcium carbonate (TUMS - DOSED IN MG ELEMENTAL CALCIUM) 500 MG chewable tablet Chew 1 tablet by mouth as needed for heartburn.    . Dextran 70-Hypromellose (ARTIFICIAL TEARS PF OP) Place 1 drop into both eyes 3 (three) times daily as needed (for dry eyes).    . diazepam (VALIUM) 5 MG tablet Take one tablet by mouth every night at bedtime for rest (Patient taking differently: Take 5 mg by mouth at bedtime. ) 30 tablet 0  . dorzolamide-timolol (COSOPT) 22.3-6.8 MG/ML ophthalmic solution Place 1 drop into the left eye 2 (two) times daily. Wait 2-5 minutes when giving two eye meds    . fluticasone (FLONASE ALLERGY RELIEF) 50 MCG/ACT nasal spray Place 2 sprays  into both nostrils daily.    . Hydrocortisone (GERHARDT'S BUTT CREAM) CREA Apply 1 application topically 3 (three) times daily.    Marland Kitchen ipratropium-albuterol (DUONEB) 0.5-2.5 (3) MG/3ML SOLN     . loperamide (IMODIUM A-D) 2 MG tablet Take 2 mg by mouth 3 (three) times daily as needed for diarrhea or loose stools.    . Melatonin 5 MG TABS Take 5 mg by mouth at bedtime.     . metoprolol tartrate (LOPRESSOR) 25 MG tablet Take 0.5 tablets (12.5 mg total) by mouth 2 (two) times daily. Hold if sbp less than 100 or heart rate less than 50, hold on dialysis day. 60 tablet 0  . midodrine (PROAMATINE) 10 MG tablet Take 10 mg by mouth See admin instructions. 10 mg by mouth daily QD. Medication to be given to resident to take while on dialysis  On Monday, Wednesday and Friday    . midodrine (PROAMATINE) 5 MG tablet     . nystatin cream (MYCOSTATIN) Apply 1 application topically 3 (three) times daily.    . OXYGEN Inhale 2 L/min into the lungs. 24/7    . promethazine (PHENERGAN) 25 MG tablet Take 25 mg by mouth every 8 (eight) hours as needed for nausea or vomiting.     No current facility-administered medications for this visit.     Allergies  Allergen Reactions  .  Erythromycin Swelling  . Erythromycin Base Other (See Comments)    Other reaction(s): GI Upset (intolerance)  . Penicillins Other (See Comments)    Unknown allergic reaction.  She has taken keflex and Rocephin many times without problems  . Zantac [Ranitidine Hcl]   . Codeine Palpitations, Rash and Other (See Comments)    Other reaction(s): GI Upset (intolerance)  . Penicillin G Rash    Past Medical History:  Diagnosis Date  . Anemia, chronic disease   . Anxiety   . Arthritis    knees  . Atrial fibrillation ([place])   . Barrett esophagus   . Benign paroxysmal positional vertigo   . Breast cancer ([place]) 1980s   bil mastectomies, no radiation or chemo  . Cataract   . Chest pain, atypical 12/05/2008   R/Lmv- normal perfusion all  regions, noe ECG changes   . CHF (congestive heart failure) ([place]) 05/13/2011   echo - TK>24%; stage 1 diastolic dysfunction; elevated LV filling pressure, MAC  . CKD (chronic kidney disease), stage III ([place])   . Claudication ([place]) 10/30/2005   doppler - normal evaluation, no evidence of aneurysm, diameter reduction, dissection, compression or vascular abnormality)  . Complication of anesthesia   . Dyslipidemia   . Dysrhythmia    Atrial Fibrillation  . Edema   . ESRD (end stage renal disease) ([place])   . Family history of adverse reaction to anesthesia    son also has nausea   . Fever blister 12/2016  . Gastritis and gastroduodenitis   . GERD (gastroesophageal reflux disease)   . Gout   . Hemodialysis-associated hypotension   . HLD (hyperlipidemia)   . Hypertension   . IBS (irritable bowel syndrome)   . Insomnia   . Morbid obesity ([place])   . PAF (paroxysmal atrial fibrillation) ([place])   . Peripheral neuropathy   . PONV (postoperative nausea and vomiting)   . Renal cyst   . Renal insufficiency 10/30/2005   doppler - abn resistance consistent w/ parenchymal disease  . Renovascular hypertension   . Type 2 diabetes mellitus with diabetic neuropathy, with long-term current use of insulin ([place])   . Vertigo     Social History   Socioeconomic History  . Marital status: Divorced    Spouse name: Not on file  . Number of children: Not on file  . Years of education: Not on file  . Highest education level: Not on file  Occupational History  . Not on file  Social Needs  . Financial resource strain: Not on file  . Food insecurity:    Worry: Not on file    Inability: Not on file  . Transportation needs:    Medical: Not on file    Non-medical: Not on file  Tobacco Use  . Smoking status: Never Smoker  . Smokeless tobacco: Never Used  Substance and Sexual Activity  . Alcohol use: No  . Drug use: No  . Sexual activity: Not on file  Lifestyle  . Physical activity:    Days per week:  Not on file    Minutes per session: Not on file  . Stress: Not on file  Relationships  . Social connections:    Talks on phone: Not on file    Gets together: Not on file    Attends religious service: Not on file    Active member of club or organization: Not on file    Attends meetings of clubs or organizations: Not on file    Relationship status:  Not on file  . Intimate partner violence:    Fear of current or ex partner: Not on file    Emotionally abused: Not on file    Physically abused: Not on file    Forced sexual activity: Not on file  Other Topics Concern  . Not on file  Social History Narrative   [facility] (10/06/17):   Originally from [facility]. Has always lived in [place]. She currently lives in [place]. No bird exposure. [place] Previously traveled to [place], [place], & [place]. Previously worked in a nursery and also a Pharmacist, community.      Family History  Problem Relation Age of Onset  . Diabetes Mother   . Heart disease Mother   . Non-Hodgkin's lymphoma Mother   . Heart attack Father   . Stroke Father   . Parkinson's disease Brother   . Lung disease Brother   . Heart disease Brother   . Multiple myeloma Sister      Review of Systems: General: negative for chills, fever, night sweats or weight changes.  Cardiovascular: negative for chest pain, edema, orthopnea, palpitations, paroxysmal nocturnal dyspnea Dermatological: negative for rash Respiratory: negative for cough or wheezing Urologic: negative for hematuria Abdominal: negative for nausea, vomiting, diarrhea, bright red blood per rectum, melena, or hematemesis Neurologic: negative for visual changes, syncope, or dizziness All other systems reviewed and are otherwise negative except as noted above.    Blood pressure (!) 110/56, pulse 61, height 5' 4.5" (1.638 m).  General appearance: alert, cooperative, no distress and in wheel chair, on O2 Neck: no carotid bruit and no JVD Lungs: decreased breath  sounds and dullness to percusion on Lt 1/3 Heart: regular rate and rhythm Extremities: ecchymosis LUE AVF site Skin: pale cool dry Neurologic: Grossly normal  EKG NSR, rate 60   ASSESSMENT AND PLAN:   Paroxysmal atrial fibrillation (HCC) Holding NSR on Amiodarone 200 mg. Not anticoagulated secondary to h/o GI bleeding  ESRD on dialysis [facility]) MWF HD- Dr [name] follows  Essential hypertension Controlled  Chronic respiratory failure with hypoxia ([place]) 02 dep at 2lpm since Oct 2015  Followed by Dr Lake Bells. She has chronic Lt pleural effusion   PLAN  She is due to have LFTs and a TSH. If there is significant abnormality we could consider decreasing her Amiodarone. Labs can be drawn at dialysis. F/U Dr Sallyanne Kuster in 6 months.   Kerin Ransom PA-C 04/06/2018 11:57 AM

## 2018-04-06 NOTE — Progress Notes (Signed)
Thank you, Lurena Joiner. I agree [employer]

## 2018-04-06 NOTE — Assessment & Plan Note (Signed)
02 dep at 2lpm since Oct 2015  Followed by Dr Lake Bells. She has chronic Lt pleural effusion

## 2018-04-06 NOTE — Assessment & Plan Note (Signed)
Controlled.  

## 2018-04-06 NOTE — Assessment & Plan Note (Signed)
MWF HD- Dr [name] follows

## 2018-04-07 DIAGNOSIS — N186 End stage renal disease: Secondary | ICD-10-CM | POA: Diagnosis not present

## 2018-04-07 DIAGNOSIS — N2581 Secondary hyperparathyroidism of renal origin: Secondary | ICD-10-CM | POA: Diagnosis not present

## 2018-04-07 DIAGNOSIS — E1122 Type 2 diabetes mellitus with diabetic chronic kidney disease: Secondary | ICD-10-CM | POA: Diagnosis not present

## 2018-04-07 DIAGNOSIS — D631 Anemia in chronic kidney disease: Secondary | ICD-10-CM | POA: Diagnosis not present

## 2018-04-09 DIAGNOSIS — D631 Anemia in chronic kidney disease: Secondary | ICD-10-CM | POA: Diagnosis not present

## 2018-04-09 DIAGNOSIS — E1122 Type 2 diabetes mellitus with diabetic chronic kidney disease: Secondary | ICD-10-CM | POA: Diagnosis not present

## 2018-04-09 DIAGNOSIS — N2581 Secondary hyperparathyroidism of renal origin: Secondary | ICD-10-CM | POA: Diagnosis not present

## 2018-04-09 DIAGNOSIS — N186 End stage renal disease: Secondary | ICD-10-CM | POA: Diagnosis not present

## 2018-04-12 DIAGNOSIS — E1122 Type 2 diabetes mellitus with diabetic chronic kidney disease: Secondary | ICD-10-CM | POA: Diagnosis not present

## 2018-04-12 DIAGNOSIS — N2581 Secondary hyperparathyroidism of renal origin: Secondary | ICD-10-CM | POA: Diagnosis not present

## 2018-04-12 DIAGNOSIS — D631 Anemia in chronic kidney disease: Secondary | ICD-10-CM | POA: Diagnosis not present

## 2018-04-12 DIAGNOSIS — N186 End stage renal disease: Secondary | ICD-10-CM | POA: Diagnosis not present

## 2018-04-13 DIAGNOSIS — Z79899 Other long term (current) drug therapy: Secondary | ICD-10-CM | POA: Diagnosis not present

## 2018-04-13 DIAGNOSIS — N390 Urinary tract infection, site not specified: Secondary | ICD-10-CM | POA: Diagnosis not present

## 2018-04-14 DIAGNOSIS — N2581 Secondary hyperparathyroidism of renal origin: Secondary | ICD-10-CM | POA: Diagnosis not present

## 2018-04-14 DIAGNOSIS — E1122 Type 2 diabetes mellitus with diabetic chronic kidney disease: Secondary | ICD-10-CM | POA: Diagnosis not present

## 2018-04-14 DIAGNOSIS — N186 End stage renal disease: Secondary | ICD-10-CM | POA: Diagnosis not present

## 2018-04-14 DIAGNOSIS — D631 Anemia in chronic kidney disease: Secondary | ICD-10-CM | POA: Diagnosis not present

## 2018-04-16 DIAGNOSIS — D631 Anemia in chronic kidney disease: Secondary | ICD-10-CM | POA: Diagnosis not present

## 2018-04-16 DIAGNOSIS — N390 Urinary tract infection, site not specified: Secondary | ICD-10-CM | POA: Diagnosis not present

## 2018-04-16 DIAGNOSIS — N2581 Secondary hyperparathyroidism of renal origin: Secondary | ICD-10-CM | POA: Diagnosis not present

## 2018-04-16 DIAGNOSIS — N186 End stage renal disease: Secondary | ICD-10-CM | POA: Diagnosis not present

## 2018-04-16 DIAGNOSIS — E1122 Type 2 diabetes mellitus with diabetic chronic kidney disease: Secondary | ICD-10-CM | POA: Diagnosis not present

## 2018-04-16 DIAGNOSIS — H04123 Dry eye syndrome of bilateral lacrimal glands: Secondary | ICD-10-CM | POA: Diagnosis not present

## 2018-04-19 DIAGNOSIS — N2581 Secondary hyperparathyroidism of renal origin: Secondary | ICD-10-CM | POA: Diagnosis not present

## 2018-04-19 DIAGNOSIS — N186 End stage renal disease: Secondary | ICD-10-CM | POA: Diagnosis not present

## 2018-04-19 DIAGNOSIS — E1122 Type 2 diabetes mellitus with diabetic chronic kidney disease: Secondary | ICD-10-CM | POA: Diagnosis not present

## 2018-04-19 DIAGNOSIS — D631 Anemia in chronic kidney disease: Secondary | ICD-10-CM | POA: Diagnosis not present

## 2018-04-21 DIAGNOSIS — N186 End stage renal disease: Secondary | ICD-10-CM | POA: Diagnosis not present

## 2018-04-21 DIAGNOSIS — D631 Anemia in chronic kidney disease: Secondary | ICD-10-CM | POA: Diagnosis not present

## 2018-04-21 DIAGNOSIS — E1122 Type 2 diabetes mellitus with diabetic chronic kidney disease: Secondary | ICD-10-CM | POA: Diagnosis not present

## 2018-04-21 DIAGNOSIS — N2581 Secondary hyperparathyroidism of renal origin: Secondary | ICD-10-CM | POA: Diagnosis not present

## 2018-04-21 DIAGNOSIS — D509 Iron deficiency anemia, unspecified: Secondary | ICD-10-CM | POA: Diagnosis not present

## 2018-04-21 DIAGNOSIS — Z992 Dependence on renal dialysis: Secondary | ICD-10-CM | POA: Diagnosis not present

## 2018-04-23 DIAGNOSIS — E1122 Type 2 diabetes mellitus with diabetic chronic kidney disease: Secondary | ICD-10-CM | POA: Diagnosis not present

## 2018-04-23 DIAGNOSIS — D631 Anemia in chronic kidney disease: Secondary | ICD-10-CM | POA: Diagnosis not present

## 2018-04-23 DIAGNOSIS — N186 End stage renal disease: Secondary | ICD-10-CM | POA: Diagnosis not present

## 2018-04-23 DIAGNOSIS — N2581 Secondary hyperparathyroidism of renal origin: Secondary | ICD-10-CM | POA: Diagnosis not present

## 2018-04-23 DIAGNOSIS — D509 Iron deficiency anemia, unspecified: Secondary | ICD-10-CM | POA: Diagnosis not present

## 2018-04-26 DIAGNOSIS — D509 Iron deficiency anemia, unspecified: Secondary | ICD-10-CM | POA: Diagnosis not present

## 2018-04-26 DIAGNOSIS — E1122 Type 2 diabetes mellitus with diabetic chronic kidney disease: Secondary | ICD-10-CM | POA: Diagnosis not present

## 2018-04-26 DIAGNOSIS — D631 Anemia in chronic kidney disease: Secondary | ICD-10-CM | POA: Diagnosis not present

## 2018-04-26 DIAGNOSIS — N2581 Secondary hyperparathyroidism of renal origin: Secondary | ICD-10-CM | POA: Diagnosis not present

## 2018-04-26 DIAGNOSIS — N186 End stage renal disease: Secondary | ICD-10-CM | POA: Diagnosis not present

## 2018-04-27 DIAGNOSIS — H401132 Primary open-angle glaucoma, bilateral, moderate stage: Secondary | ICD-10-CM | POA: Diagnosis not present

## 2018-04-27 DIAGNOSIS — H524 Presbyopia: Secondary | ICD-10-CM | POA: Diagnosis not present

## 2018-04-28 DIAGNOSIS — D509 Iron deficiency anemia, unspecified: Secondary | ICD-10-CM | POA: Diagnosis not present

## 2018-04-28 DIAGNOSIS — N2581 Secondary hyperparathyroidism of renal origin: Secondary | ICD-10-CM | POA: Diagnosis not present

## 2018-04-28 DIAGNOSIS — E1122 Type 2 diabetes mellitus with diabetic chronic kidney disease: Secondary | ICD-10-CM | POA: Diagnosis not present

## 2018-04-28 DIAGNOSIS — D631 Anemia in chronic kidney disease: Secondary | ICD-10-CM | POA: Diagnosis not present

## 2018-04-28 DIAGNOSIS — N186 End stage renal disease: Secondary | ICD-10-CM | POA: Diagnosis not present

## 2018-04-30 DIAGNOSIS — N2581 Secondary hyperparathyroidism of renal origin: Secondary | ICD-10-CM | POA: Diagnosis not present

## 2018-04-30 DIAGNOSIS — E1122 Type 2 diabetes mellitus with diabetic chronic kidney disease: Secondary | ICD-10-CM | POA: Diagnosis not present

## 2018-04-30 DIAGNOSIS — D631 Anemia in chronic kidney disease: Secondary | ICD-10-CM | POA: Diagnosis not present

## 2018-04-30 DIAGNOSIS — D509 Iron deficiency anemia, unspecified: Secondary | ICD-10-CM | POA: Diagnosis not present

## 2018-04-30 DIAGNOSIS — N186 End stage renal disease: Secondary | ICD-10-CM | POA: Diagnosis not present

## 2018-05-03 DIAGNOSIS — D631 Anemia in chronic kidney disease: Secondary | ICD-10-CM | POA: Diagnosis not present

## 2018-05-03 DIAGNOSIS — N2581 Secondary hyperparathyroidism of renal origin: Secondary | ICD-10-CM | POA: Diagnosis not present

## 2018-05-03 DIAGNOSIS — D509 Iron deficiency anemia, unspecified: Secondary | ICD-10-CM | POA: Diagnosis not present

## 2018-05-03 DIAGNOSIS — N186 End stage renal disease: Secondary | ICD-10-CM | POA: Diagnosis not present

## 2018-05-03 DIAGNOSIS — E1122 Type 2 diabetes mellitus with diabetic chronic kidney disease: Secondary | ICD-10-CM | POA: Diagnosis not present

## 2018-05-05 DIAGNOSIS — N2581 Secondary hyperparathyroidism of renal origin: Secondary | ICD-10-CM | POA: Diagnosis not present

## 2018-05-05 DIAGNOSIS — E1122 Type 2 diabetes mellitus with diabetic chronic kidney disease: Secondary | ICD-10-CM | POA: Diagnosis not present

## 2018-05-05 DIAGNOSIS — N186 End stage renal disease: Secondary | ICD-10-CM | POA: Diagnosis not present

## 2018-05-05 DIAGNOSIS — D631 Anemia in chronic kidney disease: Secondary | ICD-10-CM | POA: Diagnosis not present

## 2018-05-05 DIAGNOSIS — D509 Iron deficiency anemia, unspecified: Secondary | ICD-10-CM | POA: Diagnosis not present

## 2018-05-07 DIAGNOSIS — N2581 Secondary hyperparathyroidism of renal origin: Secondary | ICD-10-CM | POA: Diagnosis not present

## 2018-05-07 DIAGNOSIS — N186 End stage renal disease: Secondary | ICD-10-CM | POA: Diagnosis not present

## 2018-05-07 DIAGNOSIS — D509 Iron deficiency anemia, unspecified: Secondary | ICD-10-CM | POA: Diagnosis not present

## 2018-05-07 DIAGNOSIS — E1122 Type 2 diabetes mellitus with diabetic chronic kidney disease: Secondary | ICD-10-CM | POA: Diagnosis not present

## 2018-05-07 DIAGNOSIS — D631 Anemia in chronic kidney disease: Secondary | ICD-10-CM | POA: Diagnosis not present

## 2018-05-10 DIAGNOSIS — D631 Anemia in chronic kidney disease: Secondary | ICD-10-CM | POA: Diagnosis not present

## 2018-05-10 DIAGNOSIS — N2581 Secondary hyperparathyroidism of renal origin: Secondary | ICD-10-CM | POA: Diagnosis not present

## 2018-05-10 DIAGNOSIS — D509 Iron deficiency anemia, unspecified: Secondary | ICD-10-CM | POA: Diagnosis not present

## 2018-05-10 DIAGNOSIS — E1122 Type 2 diabetes mellitus with diabetic chronic kidney disease: Secondary | ICD-10-CM | POA: Diagnosis not present

## 2018-05-10 DIAGNOSIS — N186 End stage renal disease: Secondary | ICD-10-CM | POA: Diagnosis not present

## 2018-05-12 DIAGNOSIS — D509 Iron deficiency anemia, unspecified: Secondary | ICD-10-CM | POA: Diagnosis not present

## 2018-05-12 DIAGNOSIS — N2581 Secondary hyperparathyroidism of renal origin: Secondary | ICD-10-CM | POA: Diagnosis not present

## 2018-05-12 DIAGNOSIS — E1122 Type 2 diabetes mellitus with diabetic chronic kidney disease: Secondary | ICD-10-CM | POA: Diagnosis not present

## 2018-05-12 DIAGNOSIS — D631 Anemia in chronic kidney disease: Secondary | ICD-10-CM | POA: Diagnosis not present

## 2018-05-12 DIAGNOSIS — N186 End stage renal disease: Secondary | ICD-10-CM | POA: Diagnosis not present

## 2018-05-14 DIAGNOSIS — E1122 Type 2 diabetes mellitus with diabetic chronic kidney disease: Secondary | ICD-10-CM | POA: Diagnosis not present

## 2018-05-14 DIAGNOSIS — D509 Iron deficiency anemia, unspecified: Secondary | ICD-10-CM | POA: Diagnosis not present

## 2018-05-14 DIAGNOSIS — N2581 Secondary hyperparathyroidism of renal origin: Secondary | ICD-10-CM | POA: Diagnosis not present

## 2018-05-14 DIAGNOSIS — N186 End stage renal disease: Secondary | ICD-10-CM | POA: Diagnosis not present

## 2018-05-14 DIAGNOSIS — D631 Anemia in chronic kidney disease: Secondary | ICD-10-CM | POA: Diagnosis not present

## 2018-05-17 DIAGNOSIS — E1122 Type 2 diabetes mellitus with diabetic chronic kidney disease: Secondary | ICD-10-CM | POA: Diagnosis not present

## 2018-05-17 DIAGNOSIS — N2581 Secondary hyperparathyroidism of renal origin: Secondary | ICD-10-CM | POA: Diagnosis not present

## 2018-05-17 DIAGNOSIS — N186 End stage renal disease: Secondary | ICD-10-CM | POA: Diagnosis not present

## 2018-05-17 DIAGNOSIS — D509 Iron deficiency anemia, unspecified: Secondary | ICD-10-CM | POA: Diagnosis not present

## 2018-05-17 DIAGNOSIS — D631 Anemia in chronic kidney disease: Secondary | ICD-10-CM | POA: Diagnosis not present

## 2018-05-19 DIAGNOSIS — N2581 Secondary hyperparathyroidism of renal origin: Secondary | ICD-10-CM | POA: Diagnosis not present

## 2018-05-19 DIAGNOSIS — N186 End stage renal disease: Secondary | ICD-10-CM | POA: Diagnosis not present

## 2018-05-19 DIAGNOSIS — D509 Iron deficiency anemia, unspecified: Secondary | ICD-10-CM | POA: Diagnosis not present

## 2018-05-19 DIAGNOSIS — E1122 Type 2 diabetes mellitus with diabetic chronic kidney disease: Secondary | ICD-10-CM | POA: Diagnosis not present

## 2018-05-19 DIAGNOSIS — D631 Anemia in chronic kidney disease: Secondary | ICD-10-CM | POA: Diagnosis not present

## 2018-05-21 DIAGNOSIS — N2581 Secondary hyperparathyroidism of renal origin: Secondary | ICD-10-CM | POA: Diagnosis not present

## 2018-05-21 DIAGNOSIS — E1122 Type 2 diabetes mellitus with diabetic chronic kidney disease: Secondary | ICD-10-CM | POA: Diagnosis not present

## 2018-05-21 DIAGNOSIS — D631 Anemia in chronic kidney disease: Secondary | ICD-10-CM | POA: Diagnosis not present

## 2018-05-21 DIAGNOSIS — N186 End stage renal disease: Secondary | ICD-10-CM | POA: Diagnosis not present

## 2018-05-21 DIAGNOSIS — D509 Iron deficiency anemia, unspecified: Secondary | ICD-10-CM | POA: Diagnosis not present

## 2018-05-22 DIAGNOSIS — N186 End stage renal disease: Secondary | ICD-10-CM | POA: Diagnosis not present

## 2018-05-22 DIAGNOSIS — E1122 Type 2 diabetes mellitus with diabetic chronic kidney disease: Secondary | ICD-10-CM | POA: Diagnosis not present

## 2018-05-22 DIAGNOSIS — Z992 Dependence on renal dialysis: Secondary | ICD-10-CM | POA: Diagnosis not present

## 2018-05-24 DIAGNOSIS — D631 Anemia in chronic kidney disease: Secondary | ICD-10-CM | POA: Diagnosis not present

## 2018-05-24 DIAGNOSIS — D509 Iron deficiency anemia, unspecified: Secondary | ICD-10-CM | POA: Diagnosis not present

## 2018-05-24 DIAGNOSIS — N2581 Secondary hyperparathyroidism of renal origin: Secondary | ICD-10-CM | POA: Diagnosis not present

## 2018-05-24 DIAGNOSIS — E1122 Type 2 diabetes mellitus with diabetic chronic kidney disease: Secondary | ICD-10-CM | POA: Diagnosis not present

## 2018-05-24 DIAGNOSIS — N186 End stage renal disease: Secondary | ICD-10-CM | POA: Diagnosis not present

## 2018-05-25 ENCOUNTER — Encounter: Payer: Self-pay | Admitting: Family

## 2018-05-25 ENCOUNTER — Ambulatory Visit (INDEPENDENT_AMBULATORY_CARE_PROVIDER_SITE_OTHER): Payer: Medicare Other | Admitting: Family

## 2018-05-25 VITALS — BP 130/55 | HR 61 | Temp 96.9°F | Resp 16

## 2018-05-25 DIAGNOSIS — Z992 Dependence on renal dialysis: Secondary | ICD-10-CM

## 2018-05-25 DIAGNOSIS — T82590D Other mechanical complication of surgically created arteriovenous fistula, subsequent encounter: Secondary | ICD-10-CM

## 2018-05-25 DIAGNOSIS — N186 End stage renal disease: Secondary | ICD-10-CM

## 2018-05-25 NOTE — Progress Notes (Signed)
CC: recurrence of left arm swelling and difficulty accessing left arm AV graft   History of Present Illness  [name] James is a 82 y.o. 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  She underwent angioplasty of the left innominate vein and left subclavian vein on 01/19/2018 by Dr. Scot Dock.   After crossing the occlusion with a Glidewire Dr. Scot Dock was able to perform primary balloon angioplasty with a 10 mm x 8 cm Mustang balloon.  He then went back with a 9 mm x 4 cm loop tonics drug-coated balloon.  This resulted in an excellent result with no residual stenosis.  The patient had improvement of her arm swelling which lasted about 3 weeks and then recurred.  She sent back for further evaluation.  She also told Dr. Scot Dock that some of the nurses are having a hard time sticking her graft. On 03-19-18 Dr. Scot Dock performed Venoplasty of stenosis of left subclavian vein and left brachial vein and drug-coated balloon angioplasty of left subclavian vein for poorly functioning left upper arm graft and left arm swelling.  She has undergone previous venoplasty.  She had a recurrent occlusion.  She is also s/p whois s/p insertion of left upper arm AV graft and removal of old forearm graft on 04/12/15 by Dr. Scot Dock. Shehad a previous left radiocephalic fistula which failed. She then had a forearm graft. The brachial vein was fairly small and this clotted fairly early. Therefore Dr. Leodis Sias to place a new left upper arm graft.  She returns today with c/o swelling of left arm AVF, referred by R. Owens Shark NP. Pt states that the swelling started about 2-3 weeks ago, is worsening, and the HD staff are having trouble accessing.   She returned on 10-18-17with report of hardened area at center of graft that was limiting area of graft that can be used. Area was painful, occasionally warm to touch. Dr. Scot Dock reviewed results of dialysis access duplex  performed thatday.  Mild dilatationwas noted in the mid segment, stenosiswas not significant.  Advisedthat the two areas being accessed be avoided, toaccess other areas of the graft. If pt continuedto have problems with the graft, she wouldneed to make an appointment with Dr. Scot Dock to discuss creation of another access site.  Pt returned (09-29-17) as she expressing frustration with having to go to [facility] about every month to check her AV graftfor problems during HD, and she misses her HD appointment. Daughter stated that pt does fine for a while in HD, then has access issues or bleeding issues in HD. She had recently had a procedure at [facility] to open up a narrow part of the AV graft. Daughter and pt want to know if it is now worth it to look for another access.  She dialyzes M-W-F via the left upper arm AV graftat [facility].  Dr. Lyda Kalata is pt's nephrologist. Pt has had several dilatations of the left upper arm access by the nephrology group.   Pt has a hx of GI bleed in December, 2015. She was given IV heparin at that time for atrial fib, pt had rectal bleeding, sees Dr. Hilarie Fredrickson.   She is on a single agent antiplatelet medication and no anticoagulants.   The patient is right hand dominant.  Pt statesthat her left hand stays cold.   Pt is in [facility] facility since she is unable to walk and care for her self adequately. The  inability to walk started when she had renal failure and CHF about 2015.  She had Ir thoracentesis for aspiration pleural space, has pleural effusion and lung nodule, possibly at the end of 2018.    Past Medical History:  Diagnosis Date  . Anemia, chronic disease   . Anxiety   . Arthritis    knees  . Atrial fibrillation ([place])   . Barrett esophagus   . Benign paroxysmal positional vertigo   . Breast cancer ([place]) 1980s   bil mastectomies, no radiation or chemo  . Cataract   . Chest pain, atypical 12/05/2008    R/Lmv- normal perfusion all regions, noe ECG changes   . CHF (congestive heart failure) ([place]) 05/13/2011   echo - GE>95%; stage 1 diastolic dysfunction; elevated LV filling pressure, MAC  . CKD (chronic kidney disease), stage III ([place])   . Claudication ([place]) 10/30/2005   doppler - normal evaluation, no evidence of aneurysm, diameter reduction, dissection, compression or vascular abnormality)  . Complication of anesthesia   . Dyslipidemia   . Dysrhythmia    Atrial Fibrillation  . Edema   . ESRD (end stage renal disease) ([place])   . Family history of adverse reaction to anesthesia    son also has nausea   . Fever blister 12/2016  . Gastritis and gastroduodenitis   . GERD (gastroesophageal reflux disease)   . Gout   . Hemodialysis-associated hypotension   . HLD (hyperlipidemia)   . Hypertension   . IBS (irritable bowel syndrome)   . Insomnia   . Morbid obesity ([place])   . PAF (paroxysmal atrial fibrillation) ([place])   . Peripheral neuropathy   . PONV (postoperative nausea and vomiting)   . Renal cyst   . Renal insufficiency 10/30/2005   doppler - abn resistance consistent w/ parenchymal disease  . Renovascular hypertension   . Type 2 diabetes mellitus with diabetic neuropathy, with long-term current use of insulin ([place])   . Vertigo     Social History Social History   Tobacco Use  . Smoking status: Never Smoker  . Smokeless tobacco: Never Used  Substance Use Topics  . Alcohol use: No  . Drug use: No    Family History Family History  Problem Relation Age of Onset  . Diabetes Mother   . Heart disease Mother   . Non-Hodgkin's lymphoma Mother   . Heart attack Father   . Stroke Father   . Parkinson's disease Brother   . Lung disease Brother   . Heart disease Brother   . Multiple myeloma Sister     Surgical History Past Surgical History:  Procedure Laterality Date  . A/V FISTULAGRAM Left 03/19/2018   Procedure: A/V FISTULAGRAM;  Surgeon: Angelia Mould, MD;   Location: [facility];  Service: Cardiovascular;  Laterality: Left;  . A/V SHUNTOGRAM Left 01/19/2018   Procedure: A/V SHUNTOGRAM;  Surgeon: Serafina Mitchell, MD;  Location: [facility] [facility];  Service: Cardiovascular;  Laterality: Left;  . ANGIOPLASTY Left 02/12/2015   Procedure: ANGIOPLASTY;  Surgeon: Angelia Mould, MD;  [facility] C[facility];  Service: Cardiovascular;  Laterality: Left;  AVF  . AV FISTULA PLACEMENT Left 11/30/2014   Procedure: ARTERIOVENOUS (AV) FISTULA CREATION LEFT ARM;  Surgeon: Angelia Mould, MD;  Location: [place];  Service: Vascular;  Laterality: Left;  . AV FISTULA PLACEMENT Left 03/20/2015   Procedure:  Inserton of Left Upper Arm Gortex Graft;  Surgeon: Angelia Mould, MD;  Location: [place];  Service: Vascular;  Laterality: Left;  . AV FISTULA PLACEMENT Left 04/12/2015   Procedure: INSERTION OF LEFT ARM  ARTERIOVENOUS GORE-TEX GRAFT ;  Surgeon: Angelia Mould, MD;  Location: [facility];  Service: Vascular;  Laterality: Left;  . BACK SURGERY  ~1980  . CATARACT EXTRACTION Right   . CHOLECYSTECTOMY    . ESOPHAGOGASTRODUODENOSCOPY N/A 10/24/2014   Procedure: ESOPHAGOGASTRODUODENOSCOPY (EGD);  Surgeon: Jerene Bears, MD;  Location[facility] ENDOSCOPY;  Service: Endoscopy;  Laterality: N/A;  . EXCHANGE OF A DIALYSIS CATHETER Left 11/30/2014   Procedure: EXCHANGE OF A DIALYSIS CATHETER, LEFT INTERNAL JUGULAR;  Surgeon: Angelia Mould, MD;  Location: [facility];  Service: Vascular;  Laterality: Left;  . EYE SURGERY Left    transplant  . FISTULOGRAM N/A 02/12/2015   Procedure: FISTULOGRAM;  Surgeon: Angelia Mould, MD;  Location[facility] C[facility] LAB;  Service: Cardiovascular;  Laterality: N/A;  . HEMORRHOID SURGERY    . LIGATION OF ARTERIOVENOUS  FISTULA Left 03/20/2015   Procedure: LIGATION OF ARTERIOVENOUS  FISTULA;  Surgeon: Angelia Mould, MD;  Location: [place];  Service: Vascular;  Laterality: Left;  Marland Kitchen MASTECTOMY Bilateral ~1982   bil breast  reconstruction with implants  . ORIF TIBIA & FIBULA FRACTURES Left 2007   also had left non displaced malleolar fracture.   Marland Kitchen PERIPHERAL VASCULAR BALLOON ANGIOPLASTY Left 01/19/2018   Procedure: PERIPHERAL VASCULAR BALLOON ANGIOPLASTY;  Surgeon: Serafina Mitchell, MD;  Location: [place] [facility];  Service: Cardiovascular;  Laterality: Left;  fistula  . PERIPHERAL VASCULAR BALLOON ANGIOPLASTY Left 03/19/2018   Procedure: PERIPHERAL VASCULAR BALLOON ANGIOPLASTY;  Surgeon: Angelia Mould, MD;  Location: [place] [facility];  Service: Cardiovascular;  Laterality: Left;  UPPER ARM FISTULA  . REMOVAL OF GRAFT Left 04/12/2015   Procedure: REMOVAL OF LEFT ARM ARTERIOVENOUS GORE-TEX GRAFT;  Surgeon: Angelia Mould, MD;  Location: [place];  Service: Vascular;  Laterality: Left;    Allergies  Allergen Reactions  . Erythromycin Swelling  . Erythromycin Base Other (See Comments)    Other reaction(s): GI Upset (intolerance)  . Penicillins Other (See Comments)    Unknown allergic reaction.  She has taken keflex and Rocephin many times without problems  . Zantac [Ranitidine Hcl]   . Codeine Palpitations, Rash and Other (See Comments)    Other reaction(s): GI Upset (intolerance)  . Penicillin G Rash    Current Outpatient Medications  Medication Sig Dispense Refill  . acetaminophen (TYLENOL) 325 MG tablet Take 650 mg by mouth every 6 (six) hours as needed for mild pain or fever.     Marland Kitchen allopurinol (ZYLOPRIM) 100 MG tablet Take 100 mg by mouth daily.    Marland Kitchen amiodarone (PACERONE) 200 MG tablet Take 200 mg by mouth daily.     Marland Kitchen aspirin EC 81 MG tablet Take 81 mg by mouth daily.    . B Complex-C-Folic Acid (NEPHRO-VITE PO) Take 1 tablet by mouth daily.     . calcium carbonate (TUMS - DOSED IN MG ELEMENTAL CALCIUM) 500 MG chewable tablet Chew 1 tablet by mouth as needed for heartburn.    . Dextran 70-Hypromellose (ARTIFICIAL TEARS PF OP) Place 1 drop into both eyes 3 (three) times daily as needed  (for dry eyes).    . diazepam (VALIUM) 5 MG tablet Take one tablet by mouth every night at bedtime for rest (Patient taking differently: Take 5 mg by mouth at bedtime. ) 30 tablet 0  . dorzolamide-timolol (COSOPT) 22.3-6.8 MG/ML ophthalmic solution Place 1 drop into the  left eye 2 (two) times daily. Wait 2-5 minutes when giving two eye meds    . fluticasone (FLONASE ALLERGY RELIEF) 50 MCG/ACT nasal spray Place 2 sprays into both nostrils daily.    . Hydrocortisone (GERHARDT'S BUTT CREAM) CREA Apply 1 application topically 3 (three) times daily.    Marland Kitchen ipratropium-albuterol (DUONEB) 0.5-2.5 (3) MG/3ML SOLN     . loperamide (IMODIUM A-D) 2 MG tablet Take 2 mg by mouth 3 (three) times daily as needed for diarrhea or loose stools.    . Melatonin 5 MG TABS Take 5 mg by mouth at bedtime.     . metoprolol tartrate (LOPRESSOR) 25 MG tablet Take 0.5 tablets (12.5 mg total) by mouth 2 (two) times daily. Hold if sbp less than 100 or heart rate less than 50, hold on dialysis day. 60 tablet 0  . midodrine (PROAMATINE) 10 MG tablet Take 10 mg by mouth See admin instructions. 10 mg by mouth daily QD. Medication to be given to resident to take while on dialysis  On Monday, Wednesday and Friday    . midodrine (PROAMATINE) 5 MG tablet     . nystatin cream (MYCOSTATIN) Apply 1 application topically 3 (three) times daily.    . OXYGEN Inhale 2 L/min into the lungs. 24/7    . promethazine (PHENERGAN) 25 MG tablet Take 25 mg by mouth every 8 (eight) hours as needed for nausea or vomiting.     No current facility-administered medications for this visit.      REVIEW OF SYSTEMS: see HPI for pertinent positives and negatives    PHYSICAL EXAMINATION:  Vitals:   05/25/18 1426  BP: (!) 130/55  Pulse: 61  Resp: 16  Temp: (!) 96.9 F (36.1 C)  TempSrc: Oral  SpO2: 99%   There is no height or weight on file to calculate BMI.  General: The patient appears her stated age, seated in w/c HEENT: No gross  abnormalities Pulmonary: Respirations are non-labored, 2L/[place] in use, limited air movement in all fields Abdomen: Soft and non-tender with normal pitched bowel sounds. Musculoskeletal:There are no major deformities. Swelling of left arm compared to right, 1+ pitting, 2+ non pitting edema. Neurologic:No focal weakness or paresthesias are detected. Right hand grip strength is 4/5, left is 5/5.  Skin:There are no ulcersor rashes noted. Psychiatric:The patient has normal affect. Cardiovascular:There is a regular rate and rhythm without significant murmur appreciated. Bilateral radial and ulnar pulses are not palpable. Visible veins in left upper chest.  Left arm AV graft with audible bruit.       Non-Invasive Vascular Imaging None today  Medical Decision Making  [name] James is a 83 y.o. female who presents with left arm swelling, presence of left arm AV graft, subcutaneous edema of left arm (see above photo), and difficulty accessing left AV graft for hemodialysis.   She has had multiple procedures on her left arm AV graft to keep it functioning.   I spoke with Dr. Scot Dock by phone. He needs to see pt before scheduling a procedure, no vascular surgeon is in the office today, pt likley needs ligation of AVG and insertion of temporary catheter. I offered to pt that she could see the soonest available vascular surgeon in the office to discuss this and allow him to evaluate her; she stated she would like to see Dr. Scot Dock.    Clemon Chambers, RN, MSN, FNP-C [facility] Office: [phone]  05/25/2018, 2:37 PM  Clinic MD: Scot Dock on call

## 2018-05-26 ENCOUNTER — Other Ambulatory Visit: Payer: Self-pay | Admitting: *Deleted

## 2018-05-26 ENCOUNTER — Other Ambulatory Visit: Payer: Self-pay

## 2018-05-26 ENCOUNTER — Encounter: Payer: Self-pay | Admitting: Vascular Surgery

## 2018-05-26 ENCOUNTER — Encounter: Payer: Self-pay | Admitting: *Deleted

## 2018-05-26 ENCOUNTER — Ambulatory Visit (INDEPENDENT_AMBULATORY_CARE_PROVIDER_SITE_OTHER): Payer: Medicare Other | Admitting: Vascular Surgery

## 2018-05-26 VITALS — BP 136/69 | HR 71 | Temp 98.2°F | Resp 20 | Ht 64.5 in | Wt 152.0 lb

## 2018-05-26 DIAGNOSIS — Z992 Dependence on renal dialysis: Secondary | ICD-10-CM | POA: Diagnosis not present

## 2018-05-26 DIAGNOSIS — D509 Iron deficiency anemia, unspecified: Secondary | ICD-10-CM | POA: Diagnosis not present

## 2018-05-26 DIAGNOSIS — E1122 Type 2 diabetes mellitus with diabetic chronic kidney disease: Secondary | ICD-10-CM | POA: Diagnosis not present

## 2018-05-26 DIAGNOSIS — D631 Anemia in chronic kidney disease: Secondary | ICD-10-CM | POA: Diagnosis not present

## 2018-05-26 DIAGNOSIS — N2581 Secondary hyperparathyroidism of renal origin: Secondary | ICD-10-CM | POA: Diagnosis not present

## 2018-05-26 DIAGNOSIS — N186 End stage renal disease: Secondary | ICD-10-CM | POA: Diagnosis not present

## 2018-05-26 NOTE — Progress Notes (Signed)
Patient name: [name] James MRN: [id number] DOB: 30-Apr-1936 Sex: female  REASON FOR VISIT:   Poorly functioning left upper arm .  HPI:   CARLINDA James is a pleasant 82 y.o. female who has a left upper arm graft.  She dialyzes on Monday Wednesdays and Fridays.  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  She underwent angioplasty of the left innominate vein and left subclavian vein.  She later developed a recurrent stenosis on 03/19/2018 performed venoplasty of a left subclavian vein and left brachial vein stenosis with a drug-coated balloon.  This worked well initially but she is now developed recurrent swelling in the left arm and also a poorly functioning graft.  Her main complaint is related to the swelling in her arm.  She denies any significant pain or paresthesias in the left arm.  She denies any recent uremic symptoms.  Specifically, she denies nausea, vomiting, fatigue, anorexia.  Current Outpatient Medications  Medication Sig Dispense Refill  . acetaminophen (TYLENOL) 325 MG tablet Take 650 mg by mouth every 6 (six) hours as needed for mild pain or fever.     Marland Kitchen allopurinol (ZYLOPRIM) 100 MG tablet Take 100 mg by mouth daily.    Marland Kitchen amiodarone (PACERONE) 200 MG tablet Take 200 mg by mouth daily.     Marland Kitchen aspirin EC 81 MG tablet Take 81 mg by mouth daily.    . B Complex-C-Folic Acid (NEPHRO-VITE PO) Take 1 tablet by mouth daily.     . calcium carbonate (TUMS - DOSED IN MG ELEMENTAL CALCIUM) 500 MG chewable tablet Chew 1 tablet by mouth as needed for heartburn.    . Dextran 70-Hypromellose (ARTIFICIAL TEARS PF OP) Place 1 drop into both eyes 3 (three) times daily as needed (for dry eyes).    . diazepam (VALIUM) 5 MG tablet Take one tablet by mouth every night at bedtime for rest (Patient taking differently: Take 5 mg by mouth at bedtime. ) 30 tablet 0  . dorzolamide-timolol (COSOPT) 22.3-6.8 MG/ML ophthalmic solution Place 1 drop into the  left eye 2 (two) times daily. Wait 2-5 minutes when giving two eye meds    . fluticasone (FLONASE ALLERGY RELIEF) 50 MCG/ACT nasal spray Place 2 sprays into both nostrils daily.    . Hydrocortisone (GERHARDT'S BUTT CREAM) CREA Apply 1 application topically 3 (three) times daily.    Marland Kitchen ipratropium-albuterol (DUONEB) 0.5-2.5 (3) MG/3ML SOLN     . loperamide (IMODIUM A-D) 2 MG tablet Take 2 mg by mouth 3 (three) times daily as needed for diarrhea or loose stools.    Marland Kitchen loratadine (CLARITIN) 10 MG tablet Take 10 mg by mouth daily.    . Melatonin 5 MG TABS Take 5 mg by mouth at bedtime.     . metoprolol tartrate (LOPRESSOR) 25 MG tablet Take 0.5 tablets (12.5 mg total) by mouth 2 (two) times daily. Hold if sbp less than 100 or heart rate less than 50, hold on dialysis day. 60 tablet 0  . midodrine (PROAMATINE) 10 MG tablet Take 10 mg by mouth See admin instructions. 10 mg by mouth daily QD. Medication to be given to resident to take while on dialysis  On Monday, Wednesday and Friday    . midodrine (PROAMATINE) 5 MG tablet     . nystatin cream (MYCOSTATIN) Apply 1 application topically 3 (three) times daily.    . OXYGEN Inhale 2 L/min into the lungs. 24/7    . promethazine (  PHENERGAN) 25 MG tablet Take 25 mg by mouth every 8 (eight) hours as needed for nausea or vomiting.     No current facility-administered medications for this visit.     REVIEW OF SYSTEMS:  [X]  denotes positive finding, [ ]  denotes negative finding Cardiac  Comments:  Chest pain or chest pressure:    Shortness of breath upon exertion:    Short of breath when lying flat:    Irregular heart rhythm:    Constitutional    Fever or chills:     PHYSICAL EXAM:   Vitals:   05/26/18 1422  BP: 136/69  Pulse: 71  Resp: 20  Temp: 98.2 F (36.8 C)  TempSrc: Oral  SpO2: 100%  Weight: 152 lb (68.9 kg)  Height: 5' 4.5" (1.638 m)    GENERAL: The patient is a well-nourished female, in no acute distress. The vital signs are  documented above. CARDIOVASCULAR: There is a regular rate and rhythm. PULMONARY: There is good air exchange bilaterally without wheezing or rales. Her left upper arm graft is somewhat pulsatile.  She has moderate left upper extremity swelling. On the right side she has a palpable radial pulse although slightly diminished.  She does have a biphasic radial and ulnar signal with the Doppler.  She has a normal brachial pulse on the right.  DATA:   No new data.  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  She is very reluctant to consider re-intervention given how quickly the stenoses return.  Therefore I think the next best option would be to consider access in the right arm.  She has not had a vein map but I think we can schedule her for placement of a left AV fistula or AV graft on a nondialysis day.  This has been scheduled for 06/08/2018.  We have discussed the indications for the procedure and the potential complications and she is agreeable to proceed.  [name] [facility] [phone]

## 2018-05-26 NOTE — H&P (View-Only) (Signed)
Patient name: [name] James MRN: [id number] DOB: 03-05-1936 Sex: female  REASON FOR VISIT:   Poorly functioning left upper arm .  HPI:   [name] James is a pleasant 82 y.o. female who has a left upper arm graft.  She dialyzes on Monday Wednesdays and Fridays.  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  She underwent angioplasty of the left innominate vein and left subclavian vein.  She later developed a recurrent stenosis on 03/19/2018 performed venoplasty of a left subclavian vein and left brachial vein stenosis with a drug-coated balloon.  This worked well initially but she is now developed recurrent swelling in the left arm and also a poorly functioning graft.  Her main complaint is related to the swelling in her arm.  She denies any significant pain or paresthesias in the left arm.  She denies any recent uremic symptoms.  Specifically, she denies nausea, vomiting, fatigue, anorexia.  Current Outpatient Medications  Medication Sig Dispense Refill  . acetaminophen (TYLENOL) 325 MG tablet Take 650 mg by mouth every 6 (six) hours as needed for mild pain or fever.     Marland Kitchen allopurinol (ZYLOPRIM) 100 MG tablet Take 100 mg by mouth daily.    Marland Kitchen amiodarone (PACERONE) 200 MG tablet Take 200 mg by mouth daily.     Marland Kitchen aspirin EC 81 MG tablet Take 81 mg by mouth daily.    . B Complex-C-Folic Acid (NEPHRO-VITE PO) Take 1 tablet by mouth daily.     . calcium carbonate (TUMS - DOSED IN MG ELEMENTAL CALCIUM) 500 MG chewable tablet Chew 1 tablet by mouth as needed for heartburn.    . Dextran 70-Hypromellose (ARTIFICIAL TEARS PF OP) Place 1 drop into both eyes 3 (three) times daily as needed (for dry eyes).    . diazepam (VALIUM) 5 MG tablet Take one tablet by mouth every night at bedtime for rest (Patient taking differently: Take 5 mg by mouth at bedtime. ) 30 tablet 0  . dorzolamide-timolol (COSOPT) 22.3-6.8 MG/ML ophthalmic solution Place 1 drop into the  left eye 2 (two) times daily. Wait 2-5 minutes when giving two eye meds    . fluticasone (FLONASE ALLERGY RELIEF) 50 MCG/ACT nasal spray Place 2 sprays into both nostrils daily.    . Hydrocortisone (GERHARDT'S BUTT CREAM) CREA Apply 1 application topically 3 (three) times daily.    Marland Kitchen ipratropium-albuterol (DUONEB) 0.5-2.5 (3) MG/3ML SOLN     . loperamide (IMODIUM A-D) 2 MG tablet Take 2 mg by mouth 3 (three) times daily as needed for diarrhea or loose stools.    Marland Kitchen loratadine (CLARITIN) 10 MG tablet Take 10 mg by mouth daily.    . Melatonin 5 MG TABS Take 5 mg by mouth at bedtime.     . metoprolol tartrate (LOPRESSOR) 25 MG tablet Take 0.5 tablets (12.5 mg total) by mouth 2 (two) times daily. Hold if sbp less than 100 or heart rate less than 50, hold on dialysis day. 60 tablet 0  . midodrine (PROAMATINE) 10 MG tablet Take 10 mg by mouth See admin instructions. 10 mg by mouth daily QD. Medication to be given to resident to take while on dialysis  On Monday, Wednesday and Friday    . midodrine (PROAMATINE) 5 MG tablet     . nystatin cream (MYCOSTATIN) Apply 1 application topically 3 (three) times daily.    . OXYGEN Inhale 2 L/min into the lungs. 24/7    . promethazine (  PHENERGAN) 25 MG tablet Take 25 mg by mouth every 8 (eight) hours as needed for nausea or vomiting.     No current facility-administered medications for this visit.     REVIEW OF SYSTEMS:  [X]  denotes positive finding, [ ]  denotes negative finding Cardiac  Comments:  Chest pain or chest pressure:    Shortness of breath upon exertion:    Short of breath when lying flat:    Irregular heart rhythm:    Constitutional    Fever or chills:     PHYSICAL EXAM:   Vitals:   05/26/18 1422  BP: 136/69  Pulse: 71  Resp: 20  Temp: 98.2 F (36.8 C)  TempSrc: Oral  SpO2: 100%  Weight: 152 lb (68.9 kg)  Height: 5' 4.5" (1.638 m)    GENERAL: The patient is a well-nourished female, in no acute distress. The vital signs are  documented above. CARDIOVASCULAR: There is a regular rate and rhythm. PULMONARY: There is good air exchange bilaterally without wheezing or rales. Her left upper arm graft is somewhat pulsatile.  She has moderate left upper extremity swelling. On the right side she has a palpable radial pulse although slightly diminished.  She does have a biphasic radial and ulnar signal with the Doppler.  She has a normal brachial pulse on the right.  DATA:   No new data.  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  She is very reluctant to consider re-intervention given how quickly the stenoses return.  Therefore I think the next best option would be to consider access in the right arm.  She has not had a vein map but I think we can schedule her for placement of a left AV fistula or AV graft on a nondialysis day.  This has been scheduled for 06/08/2018.  We have discussed the indications for the procedure and the potential complications and she is agreeable to proceed.  [name] [facility] [phone]

## 2018-05-27 DIAGNOSIS — R498 Other voice and resonance disorders: Secondary | ICD-10-CM | POA: Diagnosis not present

## 2018-05-27 DIAGNOSIS — M6281 Muscle weakness (generalized): Secondary | ICD-10-CM | POA: Diagnosis not present

## 2018-05-27 DIAGNOSIS — I5032 Chronic diastolic (congestive) heart failure: Secondary | ICD-10-CM | POA: Diagnosis not present

## 2018-05-28 DIAGNOSIS — N186 End stage renal disease: Secondary | ICD-10-CM | POA: Diagnosis not present

## 2018-05-28 DIAGNOSIS — I5032 Chronic diastolic (congestive) heart failure: Secondary | ICD-10-CM | POA: Diagnosis not present

## 2018-05-28 DIAGNOSIS — M6281 Muscle weakness (generalized): Secondary | ICD-10-CM | POA: Diagnosis not present

## 2018-05-28 DIAGNOSIS — D631 Anemia in chronic kidney disease: Secondary | ICD-10-CM | POA: Diagnosis not present

## 2018-05-28 DIAGNOSIS — E1122 Type 2 diabetes mellitus with diabetic chronic kidney disease: Secondary | ICD-10-CM | POA: Diagnosis not present

## 2018-05-28 DIAGNOSIS — R498 Other voice and resonance disorders: Secondary | ICD-10-CM | POA: Diagnosis not present

## 2018-05-28 DIAGNOSIS — D509 Iron deficiency anemia, unspecified: Secondary | ICD-10-CM | POA: Diagnosis not present

## 2018-05-28 DIAGNOSIS — N2581 Secondary hyperparathyroidism of renal origin: Secondary | ICD-10-CM | POA: Diagnosis not present

## 2018-05-31 DIAGNOSIS — D631 Anemia in chronic kidney disease: Secondary | ICD-10-CM | POA: Diagnosis not present

## 2018-05-31 DIAGNOSIS — N186 End stage renal disease: Secondary | ICD-10-CM | POA: Diagnosis not present

## 2018-05-31 DIAGNOSIS — D509 Iron deficiency anemia, unspecified: Secondary | ICD-10-CM | POA: Diagnosis not present

## 2018-05-31 DIAGNOSIS — E1122 Type 2 diabetes mellitus with diabetic chronic kidney disease: Secondary | ICD-10-CM | POA: Diagnosis not present

## 2018-05-31 DIAGNOSIS — N2581 Secondary hyperparathyroidism of renal origin: Secondary | ICD-10-CM | POA: Diagnosis not present

## 2018-06-01 DIAGNOSIS — M6281 Muscle weakness (generalized): Secondary | ICD-10-CM | POA: Diagnosis not present

## 2018-06-01 DIAGNOSIS — R498 Other voice and resonance disorders: Secondary | ICD-10-CM | POA: Diagnosis not present

## 2018-06-01 DIAGNOSIS — I5032 Chronic diastolic (congestive) heart failure: Secondary | ICD-10-CM | POA: Diagnosis not present

## 2018-06-02 DIAGNOSIS — N2581 Secondary hyperparathyroidism of renal origin: Secondary | ICD-10-CM | POA: Diagnosis not present

## 2018-06-02 DIAGNOSIS — I5032 Chronic diastolic (congestive) heart failure: Secondary | ICD-10-CM | POA: Diagnosis not present

## 2018-06-02 DIAGNOSIS — M6281 Muscle weakness (generalized): Secondary | ICD-10-CM | POA: Diagnosis not present

## 2018-06-02 DIAGNOSIS — R498 Other voice and resonance disorders: Secondary | ICD-10-CM | POA: Diagnosis not present

## 2018-06-02 DIAGNOSIS — D631 Anemia in chronic kidney disease: Secondary | ICD-10-CM | POA: Diagnosis not present

## 2018-06-02 DIAGNOSIS — N186 End stage renal disease: Secondary | ICD-10-CM | POA: Diagnosis not present

## 2018-06-02 DIAGNOSIS — D509 Iron deficiency anemia, unspecified: Secondary | ICD-10-CM | POA: Diagnosis not present

## 2018-06-02 DIAGNOSIS — E1122 Type 2 diabetes mellitus with diabetic chronic kidney disease: Secondary | ICD-10-CM | POA: Diagnosis not present

## 2018-06-03 DIAGNOSIS — M6281 Muscle weakness (generalized): Secondary | ICD-10-CM | POA: Diagnosis not present

## 2018-06-03 DIAGNOSIS — I5032 Chronic diastolic (congestive) heart failure: Secondary | ICD-10-CM | POA: Diagnosis not present

## 2018-06-03 DIAGNOSIS — R498 Other voice and resonance disorders: Secondary | ICD-10-CM | POA: Diagnosis not present

## 2018-06-04 DIAGNOSIS — D509 Iron deficiency anemia, unspecified: Secondary | ICD-10-CM | POA: Diagnosis not present

## 2018-06-04 DIAGNOSIS — N186 End stage renal disease: Secondary | ICD-10-CM | POA: Diagnosis not present

## 2018-06-04 DIAGNOSIS — R498 Other voice and resonance disorders: Secondary | ICD-10-CM | POA: Diagnosis not present

## 2018-06-04 DIAGNOSIS — N2581 Secondary hyperparathyroidism of renal origin: Secondary | ICD-10-CM | POA: Diagnosis not present

## 2018-06-04 DIAGNOSIS — M6281 Muscle weakness (generalized): Secondary | ICD-10-CM | POA: Diagnosis not present

## 2018-06-04 DIAGNOSIS — E1122 Type 2 diabetes mellitus with diabetic chronic kidney disease: Secondary | ICD-10-CM | POA: Diagnosis not present

## 2018-06-04 DIAGNOSIS — I5032 Chronic diastolic (congestive) heart failure: Secondary | ICD-10-CM | POA: Diagnosis not present

## 2018-06-04 DIAGNOSIS — D631 Anemia in chronic kidney disease: Secondary | ICD-10-CM | POA: Diagnosis not present

## 2018-06-07 DIAGNOSIS — R498 Other voice and resonance disorders: Secondary | ICD-10-CM | POA: Diagnosis not present

## 2018-06-07 DIAGNOSIS — N186 End stage renal disease: Secondary | ICD-10-CM | POA: Diagnosis not present

## 2018-06-07 DIAGNOSIS — M6281 Muscle weakness (generalized): Secondary | ICD-10-CM | POA: Diagnosis not present

## 2018-06-07 DIAGNOSIS — I5032 Chronic diastolic (congestive) heart failure: Secondary | ICD-10-CM | POA: Diagnosis not present

## 2018-06-07 DIAGNOSIS — D631 Anemia in chronic kidney disease: Secondary | ICD-10-CM | POA: Diagnosis not present

## 2018-06-07 DIAGNOSIS — E1122 Type 2 diabetes mellitus with diabetic chronic kidney disease: Secondary | ICD-10-CM | POA: Diagnosis not present

## 2018-06-07 DIAGNOSIS — N2581 Secondary hyperparathyroidism of renal origin: Secondary | ICD-10-CM | POA: Diagnosis not present

## 2018-06-07 DIAGNOSIS — D509 Iron deficiency anemia, unspecified: Secondary | ICD-10-CM | POA: Diagnosis not present

## 2018-06-07 NOTE — Progress Notes (Addendum)
Please complete pt assessment on DOS. SDW-pre-op instructions faxed to pt nurse, Juanda Bond, LPN. Nurse denies that pt C/O any acute SOB and chest pain. Nurse stated that pt is not currently being treated for diabetes. Nurse confirmed receipt of fax and verbalized understanding of all pre-op instructions.

## 2018-06-07 NOTE — Progress Notes (Signed)
Anesthesia Chart Review:  Pt is a same day work up     Case:  [id number] Date/Time:  06/08/18 1117   Procedure:  ARTERIOVENOUS (AV) FISTULA CREATION VERSUS GRAFT (Right )   Anesthesia type:  Monitor Anesthesia Care   Pre-op diagnosis:  end stage renal disease   Location:  [facility] OR ROOM 12 / [name] OR   Surgeon:  Angelia Mould, MD      DISCUSSION: - Pt is an 82 year old female with hx CHF, PAF (no anticoagulation due to GI bleed), HTN, DM, ESRD on hemodialysis, anemia, chronic respiratory failure with hypoxia (O2 dependent at 2 lpm), chronic left-sided pleural effusion (pulmonology notes document "I see no benefit to repeating thoracentesis.  I do not think this effusion will ever go away")   PROVIDERS: PCP is [name], DO Cardiologist is Sanda Klein, MD. Last office visit 04/06/18 Pulmonologist is Simonne Maffucci, MD.Last office visit 02/04/18   LABS: Will be obtained day of surgery    IMAGES:  CT chest 01/05/18:  1. Stable numerous scattered ground-glass nodules in both lungs. This is most likely some type of chronic inflammatory or postinflammatory process. No progressive or new findings. Recommend continued surveillance with repeat noncontrast chest CT in 6 months. 2. Stable small scattered mediastinal and hilar lymph nodes. 3. Persistent left pleural effusion and dense left lower lobe airspace consolidation. 4. Aortic Atherosclerosis and Emphysema    EKG 04/06/18: NSR. Septal infarct, age undetermined.    CV:  Echo 09/22/17:  - Left ventricle: The cavity size was normal. Wall thickness was increased in a pattern of mild LVH. Systolic function was normal. The estimated ejection fraction was in the range of 60% to 65%. Wall motion was normal; there were no regional wall motion abnormalities. Doppler parameters are consistent with abnormal left ventricular relaxation (grade 1 diastolic dysfunction). Doppler parameters are consistent with high ventricular filling  pressure. - Mitral valve: Severely calcified annulus. - Pulmonary arteries: PA peak pressure: 34 mm Hg (S). - Pericardium, extracardiac: A trivial pericardial effusion was identified. There was a left pleural effusion. - Impressions: Normal LV systolic function; mild diastolic dysfunction; elevated LV filling pressure; mild LVH; pleural effusion with echodense areas; suggest chest CT to further assess.   Past Medical History:  Diagnosis Date  . Anemia, chronic disease   . Anxiety   . Arthritis    knees  . Atrial fibrillation ([place])   . Barrett esophagus   . Benign paroxysmal positional vertigo   . Breast cancer ([place]) 1980s   bil mastectomies, no radiation or chemo  . Cataract   . Chest pain, atypical 12/05/2008   R/Lmv- normal perfusion all regions, noe ECG changes   . CHF (congestive heart failure) ([place]) 05/13/2011   echo - BJ>47%; stage 1 diastolic dysfunction; elevated LV filling pressure, MAC  . CKD (chronic kidney disease), stage III ([place])   . Claudication ([place]) 10/30/2005   doppler - normal evaluation, no evidence of aneurysm, diameter reduction, dissection, compression or vascular abnormality)  . Complication of anesthesia   . Dyslipidemia   . Dysrhythmia    Atrial Fibrillation  . Edema   . ESRD (end stage renal disease) ([place])   . Family history of adverse reaction to anesthesia    son also has nausea   . Fever blister 12/2016  . Gastritis and gastroduodenitis   . GERD (gastroesophageal reflux disease)   . Gout   . Hemodialysis-associated hypotension   . HLD (hyperlipidemia)   . Hypertension   .  IBS (irritable bowel syndrome)   . Insomnia   . Morbid obesity ([place])   . PAF (paroxysmal atrial fibrillation) ([place])   . Peripheral neuropathy   . PONV (postoperative nausea and vomiting)   . Renal cyst   . Renal insufficiency 10/30/2005   doppler - abn resistance consistent w/ parenchymal disease  . Renovascular hypertension   . Type 2 diabetes mellitus with diabetic  neuropathy, with long-term current use of insulin ([place])   . Vertigo     Past Surgical History:  Procedure Laterality Date  . A/V FISTULAGRAM Left 03/19/2018   Procedure: A/V FISTULAGRAM;  Surgeon: Angelia Mould, MD;  Location: [place] CV LAB;  Service: Cardiovascular;  Laterality: Left;  . A/V SHUNTOGRAM Left 01/19/2018   Procedure: A/V SHUNTOGRAM;  Surgeon: Serafina Mitchell, MD;  Location: [place] CV LAB;  Service: Cardiovascular;  Laterality: Left;  . ANGIOPLASTY Left 02/12/2015   Procedure: ANGIOPLASTY;  Surgeon: Angelia Mould, MD;  [facility] CATH LAB;  Service: Cardiovascular;  Laterality: Left;  AVF  . AV FISTULA PLACEMENT Left 11/30/2014   Procedure: ARTERIOVENOUS (AV) FISTULA CREATION LEFT ARM;  Surgeon: Angelia Mould, MD;  Location: [place];  Service: Vascular;  Laterality: Left;  . AV FISTULA PLACEMENT Left 03/20/2015   Procedure:  Inserton of Left Upper Arm Gortex Graft;  Surgeon: Angelia Mould, MD;  Location: [place];  Service: Vascular;  Laterality: Left;  . AV FISTULA PLACEMENT Left 04/12/2015   Procedure: INSERTION OF LEFT ARM  ARTERIOVENOUS GORE-TEX GRAFT ;  Surgeon: Angelia Mould, MD;  Location: [place];  Service: Vascular;  Laterality: Left;  . BACK SURGERY  ~1980  . CATARACT EXTRACTION Right   . CHOLECYSTECTOMY    . ESOPHAGOGASTRODUODENOSCOPY N/A 10/24/2014   Procedure: ESOPHAGOGASTRODUODENOSCOPY (EGD);  Surgeon: Jerene Bears, MD;  Location: [facility] ENDOSCOPY;  Service: Endoscopy;  Laterality: N/A;  . EXCHANGE OF A DIALYSIS CATHETER Left 11/30/2014   Procedure: EXCHANGE OF A DIALYSIS CATHETER, LEFT INTERNAL JUGULAR;  Surgeon: Angelia Mould, MD;  Location: [place];  Service: Vascular;  Laterality: Left;  . EYE SURGERY Left    transplant  . FISTULOGRAM N/A 02/12/2015   Procedure: FISTULOGRAM;  Surgeon: Angelia Mould, MD;  Location: [facility] CATH LAB;  Service: Cardiovascular;  Laterality: N/A;  . HEMORRHOID SURGERY    . LIGATION  OF ARTERIOVENOUS  FISTULA Left 03/20/2015   Procedure: LIGATION OF ARTERIOVENOUS  FISTULA;  Surgeon: Angelia Mould, MD;  Location: [place];  Service: Vascular;  Laterality: Left;  Marland Kitchen MASTECTOMY Bilateral ~1982   bil breast reconstruction with implants  . ORIF TIBIA & FIBULA FRACTURES Left 2007   also had left non displaced malleolar fracture.   Marland Kitchen PERIPHERAL VASCULAR BALLOON ANGIOPLASTY Left 01/19/2018   Procedure: PERIPHERAL VASCULAR BALLOON ANGIOPLASTY;  Surgeon: Serafina Mitchell, MD;  Location: [place] CV LAB;  Service: Cardiovascular;  Laterality: Left;  fistula  . PERIPHERAL VASCULAR BALLOON ANGIOPLASTY Left 03/19/2018   Procedure: PERIPHERAL VASCULAR BALLOON ANGIOPLASTY;  Surgeon: Angelia Mould, MD;  Location: [place] CV LAB;  Service: Cardiovascular;  Laterality: Left;  UPPER ARM FISTULA  . REMOVAL OF GRAFT Left 04/12/2015   Procedure: REMOVAL OF LEFT ARM ARTERIOVENOUS GORE-TEX GRAFT;  Surgeon: Angelia Mould, MD;  Location: [place];  Service: Vascular;  Laterality: Left;    MEDICATIONS: No current facility-administered medications for this encounter.    Marland Kitchen acetaminophen (TYLENOL) 325 MG tablet  . allopurinol (ZYLOPRIM) 100 MG tablet  . amiodarone (  PACERONE) 200 MG tablet  . aspirin EC 81 MG tablet  . B Complex-C-Folic Acid (NEPHRO-VITE PO)  . diazepam (VALIUM) 5 MG tablet  . DIMETHICONE, TOPICAL, 5 % CREA  . dorzolamide-timolol (COSOPT) 22.3-6.8 MG/ML ophthalmic solution  . fluticasone (FLONASE ALLERGY RELIEF) 50 MCG/ACT nasal spray  . Hydrocortisone (GERHARDT'S BUTT CREAM) CREA  . loperamide (IMODIUM A-D) 2 MG tablet  . loratadine (CLARITIN) 10 MG tablet  . Melatonin 5 MG TABS  . metoprolol tartrate (LOPRESSOR) 25 MG tablet  . midodrine (PROAMATINE) 10 MG tablet  . Polyethyl Glycol-Propyl Glycol (SYSTANE ULTRA) 0.4-0.3 % SOLN  . promethazine (PHENERGAN) 25 MG tablet  . OXYGEN    If labs acceptable day of surgery, I anticipate pt can proceed with  surgery as scheduled.  Willeen Cass, FNP-BC [facility]/Anesthesiology Phone: [phone] 06/07/2018 2:07 PM

## 2018-06-07 NOTE — Pre-Procedure Instructions (Signed)
   [name] James  06/07/2018     [place], [place] [address] Phone: [phone] Fax: [phone]   Your procedure is scheduled on Tuesday, June 08, 2018  Report to [facility] Admitting at 9:00 A.M.  Call this number if you have problems the morning of surgery:  [phone]   Remember:  Do not eat food or drink liquids after midnight.  Take these medicines the morning of surgery with A SIP OF WATER : allopurinol (ZYLOPRIM), amiodarone (PACERONE), aspirin, loratadine (CLARITIN), metoprolol tartrate (LOPRESSOR) Hold if sbp less than 100 or heart rate less than 50, hold on dialysis day. Midodrine (PROAMATINE),  Cosopt and Systane eye drops,  Flonase Allergy Relief nasal spray, if needed: acetaminophen (TYLENOL) for pain, promethazine (PHENERGAN) Stop vitamins, fish oil,  Melatonin and herbal medications. Do not take any NSAIDs ie: Ibuprofen, Advil, Naproxen (Aleve), Motrin, BC and Goody Powder; stop now   Do not wear jewelry, make-up or nail polish.  Do not wear lotions, powders, or perfumes, or deodorant.  Do not shave 48 hours prior to surgery.   Do not bring valuables to the hospital.  [facility] is not responsible for any belongings or valuables.  Contacts, dentures or bridgework may not be worn into surgery.   Patients discharged the day of surgery will not be allowed to drive home.  Please read over the following fact sheets that you were given.

## 2018-06-08 ENCOUNTER — Ambulatory Visit (HOSPITAL_COMMUNITY)
Admission: RE | Admit: 2018-06-08 | Discharge: 2018-06-08 | Disposition: A | Discharge status: Skilled Nursing Facility | Payer: Medicare Other | Source: Ambulatory Visit | Attending: Vascular Surgery | Admitting: Vascular Surgery

## 2018-06-08 ENCOUNTER — Other Ambulatory Visit: Payer: Self-pay

## 2018-06-08 ENCOUNTER — Ambulatory Visit (HOSPITAL_COMMUNITY): Payer: Medicare Other | Admitting: Emergency Medicine

## 2018-06-08 ENCOUNTER — Encounter (HOSPITAL_COMMUNITY)
Admission: RE | Disposition: A | Discharge status: Skilled Nursing Facility | Payer: Self-pay | Source: Ambulatory Visit | Attending: Vascular Surgery

## 2018-06-08 ENCOUNTER — Encounter (HOSPITAL_COMMUNITY): Payer: Self-pay | Admitting: *Deleted

## 2018-06-08 DIAGNOSIS — Z9013 Acquired absence of bilateral breasts and nipples: Secondary | ICD-10-CM | POA: Diagnosis not present

## 2018-06-08 DIAGNOSIS — Z880 Allergy status to penicillin: Secondary | ICD-10-CM | POA: Insufficient documentation

## 2018-06-08 DIAGNOSIS — N185 Chronic kidney disease, stage 5: Secondary | ICD-10-CM | POA: Diagnosis not present

## 2018-06-08 DIAGNOSIS — E1122 Type 2 diabetes mellitus with diabetic chronic kidney disease: Secondary | ICD-10-CM | POA: Insufficient documentation

## 2018-06-08 DIAGNOSIS — Z888 Allergy status to other drugs, medicaments and biological substances status: Secondary | ICD-10-CM | POA: Insufficient documentation

## 2018-06-08 DIAGNOSIS — Z992 Dependence on renal dialysis: Secondary | ICD-10-CM | POA: Insufficient documentation

## 2018-06-08 DIAGNOSIS — N186 End stage renal disease: Secondary | ICD-10-CM | POA: Insufficient documentation

## 2018-06-08 DIAGNOSIS — I509 Heart failure, unspecified: Secondary | ICD-10-CM | POA: Diagnosis not present

## 2018-06-08 DIAGNOSIS — Z9981 Dependence on supplemental oxygen: Secondary | ICD-10-CM | POA: Insufficient documentation

## 2018-06-08 DIAGNOSIS — K589 Irritable bowel syndrome without diarrhea: Secondary | ICD-10-CM | POA: Insufficient documentation

## 2018-06-08 DIAGNOSIS — F419 Anxiety disorder, unspecified: Secondary | ICD-10-CM | POA: Insufficient documentation

## 2018-06-08 DIAGNOSIS — I132 Hypertensive heart and chronic kidney disease with heart failure and with stage 5 chronic kidney disease, or end stage renal disease: Secondary | ICD-10-CM | POA: Insufficient documentation

## 2018-06-08 DIAGNOSIS — Z881 Allergy status to other antibiotic agents status: Secondary | ICD-10-CM | POA: Insufficient documentation

## 2018-06-08 DIAGNOSIS — J9611 Chronic respiratory failure with hypoxia: Secondary | ICD-10-CM | POA: Insufficient documentation

## 2018-06-08 DIAGNOSIS — K219 Gastro-esophageal reflux disease without esophagitis: Secondary | ICD-10-CM | POA: Diagnosis not present

## 2018-06-08 DIAGNOSIS — Z7982 Long term (current) use of aspirin: Secondary | ICD-10-CM | POA: Insufficient documentation

## 2018-06-08 DIAGNOSIS — Z853 Personal history of malignant neoplasm of breast: Secondary | ICD-10-CM | POA: Insufficient documentation

## 2018-06-08 DIAGNOSIS — Z79899 Other long term (current) drug therapy: Secondary | ICD-10-CM | POA: Diagnosis not present

## 2018-06-08 DIAGNOSIS — E785 Hyperlipidemia, unspecified: Secondary | ICD-10-CM | POA: Insufficient documentation

## 2018-06-08 DIAGNOSIS — I700 Atherosclerosis of aorta: Secondary | ICD-10-CM | POA: Diagnosis not present

## 2018-06-08 DIAGNOSIS — J439 Emphysema, unspecified: Secondary | ICD-10-CM | POA: Insufficient documentation

## 2018-06-08 DIAGNOSIS — J90 Pleural effusion, not elsewhere classified: Secondary | ICD-10-CM | POA: Insufficient documentation

## 2018-06-08 DIAGNOSIS — I1311 Hypertensive heart and chronic kidney disease without heart failure, with stage 5 chronic kidney disease, or end stage renal disease: Secondary | ICD-10-CM | POA: Diagnosis not present

## 2018-06-08 DIAGNOSIS — I480 Paroxysmal atrial fibrillation: Secondary | ICD-10-CM | POA: Insufficient documentation

## 2018-06-08 DIAGNOSIS — Z885 Allergy status to narcotic agent status: Secondary | ICD-10-CM | POA: Insufficient documentation

## 2018-06-08 DIAGNOSIS — E1142 Type 2 diabetes mellitus with diabetic polyneuropathy: Secondary | ICD-10-CM | POA: Diagnosis not present

## 2018-06-08 HISTORY — PX: AV FISTULA PLACEMENT: SHX1204

## 2018-06-08 HISTORY — DX: Pneumonia, unspecified organism: J18.9

## 2018-06-08 LAB — GLUCOSE, CAPILLARY
Glucose-Capillary: 130 mg/dL — ABNORMAL HIGH (ref 65–99)
Glucose-Capillary: 89 mg/dL (ref 65–99)

## 2018-06-08 LAB — POCT I-STAT 4, (NA,K, GLUC, HGB,HCT)
Glucose, Bld: 121 mg/dL — ABNORMAL HIGH (ref 65–99)
HEMATOCRIT: 44 % (ref 36.0–46.0)
HEMOGLOBIN: 15 g/dL (ref 12.0–15.0)
POTASSIUM: 5.5 mmol/L — AB (ref 3.5–5.1)
SODIUM: 137 mmol/L (ref 135–145)

## 2018-06-08 SURGERY — INSERTION OF ARTERIOVENOUS (AV) GORE-TEX GRAFT ARM
Anesthesia: Monitor Anesthesia Care | Site: Arm Lower | Laterality: Right

## 2018-06-08 MED ORDER — HEPARIN SODIUM (PORCINE) 1000 UNIT/ML IJ SOLN
INTRAMUSCULAR | Status: DC | PRN
  Administered 2018-06-08: 6000 [IU] via INTRAVENOUS

## 2018-06-08 MED ORDER — THROMBIN 20000 UNITS EX SOLR
CUTANEOUS | Status: DC | PRN
  Administered 2018-06-08: 14:00:00 via TOPICAL

## 2018-06-08 MED ORDER — THROMBIN 20000 UNITS EX SOLR
CUTANEOUS | Status: AC
  Filled 2018-06-08: qty 20000

## 2018-06-08 MED ORDER — SODIUM CHLORIDE 0.9 % IV SOLN
INTRAVENOUS | Status: DC | PRN
  Administered 2018-06-08: 13:00:00

## 2018-06-08 MED ORDER — PHENYLEPHRINE HCL 10 MG/ML IJ SOLN
INTRAVENOUS | Status: DC | PRN
  Administered 2018-06-08: 50 ug/min via INTRAVENOUS

## 2018-06-08 MED ORDER — ALBUMIN HUMAN 5 % IV SOLN
12.5000 g | Freq: Once | INTRAVENOUS | Status: AC
  Administered 2018-06-08: 12.5 g via INTRAVENOUS

## 2018-06-08 MED ORDER — FENTANYL CITRATE (PF) 100 MCG/2ML IJ SOLN: 25.0000 ug | INTRAMUSCULAR | Status: DC | PRN

## 2018-06-08 MED ORDER — PHENYLEPHRINE 40 MCG/ML (10ML) SYRINGE FOR IV PUSH (FOR BLOOD PRESSURE SUPPORT)
PREFILLED_SYRINGE | INTRAVENOUS | Status: DC | PRN
  Administered 2018-06-08 (×2): 80 ug via INTRAVENOUS

## 2018-06-08 MED ORDER — ALBUMIN HUMAN 5 % IV SOLN
INTRAVENOUS | Status: AC
  Filled 2018-06-08: qty 250

## 2018-06-08 MED ORDER — OXYCODONE HCL 5 MG PO TABS: 5.0000 mg | ORAL_TABLET | ORAL | 0 refills | Status: DC | PRN

## 2018-06-08 MED ORDER — PHENYLEPHRINE 40 MCG/ML (10ML) SYRINGE FOR IV PUSH (FOR BLOOD PRESSURE SUPPORT)
PREFILLED_SYRINGE | INTRAVENOUS | Status: AC
  Filled 2018-06-08: qty 10

## 2018-06-08 MED ORDER — VANCOMYCIN HCL IN DEXTROSE 1-5 GM/200ML-% IV SOLN
1000.0000 mg | INTRAVENOUS | Status: AC
  Administered 2018-06-08: 1000 mg via INTRAVENOUS
  Filled 2018-06-08: qty 200

## 2018-06-08 MED ORDER — PHENYLEPHRINE HCL 10 MG/ML IJ SOLN: INTRAMUSCULAR | Status: DC | PRN

## 2018-06-08 MED ORDER — ONDANSETRON HCL 4 MG/2ML IJ SOLN
INTRAMUSCULAR | Status: AC
  Filled 2018-06-08: qty 2

## 2018-06-08 MED ORDER — EPHEDRINE SULFATE 50 MG/ML IJ SOLN
INTRAMUSCULAR | Status: AC
  Filled 2018-06-08: qty 1

## 2018-06-08 MED ORDER — FENTANYL CITRATE (PF) 250 MCG/5ML IJ SOLN
INTRAMUSCULAR | Status: AC
  Filled 2018-06-08: qty 5

## 2018-06-08 MED ORDER — 0.9 % SODIUM CHLORIDE (POUR BTL) OPTIME
TOPICAL | Status: DC | PRN
  Administered 2018-06-08: 1000 mL

## 2018-06-08 MED ORDER — SODIUM CHLORIDE 0.9 % IV SOLN
INTRAVENOUS | Status: AC
  Filled 2018-06-08: qty 1.2

## 2018-06-08 MED ORDER — MIDODRINE HCL 5 MG PO TABS
10.0000 mg | ORAL_TABLET | Freq: Once | ORAL | Status: AC
  Administered 2018-06-08: 10 mg via ORAL
  Filled 2018-06-08: qty 2

## 2018-06-08 MED ORDER — LIDOCAINE HCL (PF) 1 % IJ SOLN
INTRAMUSCULAR | Status: DC | PRN
  Administered 2018-06-08: 30 mL

## 2018-06-08 MED ORDER — LIDOCAINE 2% (20 MG/ML) 5 ML SYRINGE
INTRAMUSCULAR | Status: AC
  Filled 2018-06-08: qty 5

## 2018-06-08 MED ORDER — LIDOCAINE-EPINEPHRINE (PF) 1 %-1:200000 IJ SOLN
INTRAMUSCULAR | Status: AC
  Filled 2018-06-08: qty 30

## 2018-06-08 MED ORDER — EPHEDRINE SULFATE 50 MG/ML IJ SOLN
INTRAMUSCULAR | Status: DC | PRN
  Administered 2018-06-08 (×4): 10 mg via INTRAVENOUS

## 2018-06-08 MED ORDER — LIDOCAINE 2% (20 MG/ML) 5 ML SYRINGE
INTRAMUSCULAR | Status: DC | PRN
  Administered 2018-06-08: 40 mg via INTRAVENOUS

## 2018-06-08 MED ORDER — ONDANSETRON HCL 4 MG/2ML IJ SOLN
INTRAMUSCULAR | Status: DC | PRN
  Administered 2018-06-08: 4 mg via INTRAVENOUS

## 2018-06-08 MED ORDER — PROTAMINE SULFATE 10 MG/ML IV SOLN
INTRAVENOUS | Status: DC | PRN
  Administered 2018-06-08: 40 mg via INTRAVENOUS

## 2018-06-08 MED ORDER — PROPOFOL 500 MG/50ML IV EMUL
INTRAVENOUS | Status: DC | PRN
  Administered 2018-06-08: 30 ug/kg/min via INTRAVENOUS

## 2018-06-08 MED ORDER — SODIUM CHLORIDE 0.9 % IV SOLN
INTRAVENOUS | Status: DC
  Administered 2018-06-08: 09:00:00 via INTRAVENOUS

## 2018-06-08 MED ORDER — LIDOCAINE HCL (CARDIAC) PF 100 MG/5ML IV SOSY: PREFILLED_SYRINGE | INTRAVENOUS | Status: DC | PRN

## 2018-06-08 MED ORDER — LIDOCAINE HCL (PF) 1 % IJ SOLN
INTRAMUSCULAR | Status: AC
  Filled 2018-06-08: qty 30

## 2018-06-08 SURGICAL SUPPLY — 42 items
ADH SKN CLS APL DERMABOND .7 (GAUZE/BANDAGES/DRESSINGS) ×2
ARMBAND PINK RESTRICT EXTREMIT (MISCELLANEOUS) ×8 IMPLANT
BANDAGE ELASTIC 4 VELCRO ST LF (GAUZE/BANDAGES/DRESSINGS) ×3 IMPLANT
BNDG GAUZE ELAST 4 BULKY (GAUZE/BANDAGES/DRESSINGS) ×3 IMPLANT
CANISTER SUCT 3000ML PPV (MISCELLANEOUS) ×4 IMPLANT
CANNULA VESSEL 3MM 2 BLNT TIP (CANNULA) ×4 IMPLANT
CLIP VESOCCLUDE MED 6/CT (CLIP) ×4 IMPLANT
CLIP VESOCCLUDE SM WIDE 6/CT (CLIP) ×4 IMPLANT
DECANTER SPIKE VIAL GLASS SM (MISCELLANEOUS) ×4 IMPLANT
DERMABOND ADVANCED (GAUZE/BANDAGES/DRESSINGS) ×2
DERMABOND ADVANCED .7 DNX12 (GAUZE/BANDAGES/DRESSINGS) ×2 IMPLANT
ELECT REM PT RETURN 9FT ADLT (ELECTROSURGICAL) ×4
ELECTRODE REM PT RTRN 9FT ADLT (ELECTROSURGICAL) ×2 IMPLANT
GAUZE SPONGE 4X4 12PLY STRL LF (GAUZE/BANDAGES/DRESSINGS) ×3 IMPLANT
GAUZE SPONGE 4X4 16PLY XRAY LF (GAUZE/BANDAGES/DRESSINGS) ×3 IMPLANT
GLOVE BIO SURGEON STRL SZ7.5 (GLOVE) ×4 IMPLANT
GLOVE BIOGEL PI IND STRL 6.5 (GLOVE) ×2 IMPLANT
GLOVE BIOGEL PI IND STRL 7.0 (GLOVE) ×1 IMPLANT
GLOVE BIOGEL PI IND STRL 8 (GLOVE) ×2 IMPLANT
GLOVE BIOGEL PI INDICATOR 6.5 (GLOVE) ×4
GLOVE BIOGEL PI INDICATOR 7.0 (GLOVE) ×2
GLOVE BIOGEL PI INDICATOR 8 (GLOVE) ×2
GLOVE ECLIPSE 6.5 STRL STRAW (GLOVE) ×3 IMPLANT
GLOVE SURG SS PI 6.5 STRL IVOR (GLOVE) ×3 IMPLANT
GOWN STRL REUS W/ TWL LRG LVL3 (GOWN DISPOSABLE) ×6 IMPLANT
GOWN STRL REUS W/ TWL XL LVL3 (GOWN DISPOSABLE) ×1 IMPLANT
GOWN STRL REUS W/TWL LRG LVL3 (GOWN DISPOSABLE) ×12
GOWN STRL REUS W/TWL XL LVL3 (GOWN DISPOSABLE) ×4
GRAFT GORETEX STRT 4-7X45 (Vascular Products) ×3 IMPLANT
KIT BASIN OR (CUSTOM PROCEDURE TRAY) ×4 IMPLANT
KIT TURNOVER KIT B (KITS) ×4 IMPLANT
NS IRRIG 1000ML POUR BTL (IV SOLUTION) ×4 IMPLANT
PACK CV ACCESS (CUSTOM PROCEDURE TRAY) ×4 IMPLANT
PAD ARMBOARD 7.5X6 YLW CONV (MISCELLANEOUS) ×8 IMPLANT
SPONGE SURGIFOAM ABS GEL 100 (HEMOSTASIS) ×3 IMPLANT
SUT PROLENE 6 0 BV (SUTURE) ×10 IMPLANT
SUT VIC AB 3-0 SH 27 (SUTURE) ×8
SUT VIC AB 3-0 SH 27X BRD (SUTURE) ×3 IMPLANT
SUT VICRYL 4-0 PS2 18IN ABS (SUTURE) ×7 IMPLANT
TOWEL GREEN STERILE (TOWEL DISPOSABLE) ×4 IMPLANT
UNDERPAD 30X30 (UNDERPADS AND DIAPERS) ×4 IMPLANT
WATER STERILE IRR 1000ML POUR (IV SOLUTION) ×4 IMPLANT

## 2018-06-08 NOTE — Interval H&P Note (Signed)
History and Physical Interval Note:  06/08/2018 12:08 PM  [name] James  has presented today for surgery, with the diagnosis of end stage renal disease  The various methods of treatment have been discussed with the patient and family. After consideration of risks, benefits and other options for treatment, the patient has consented to  Procedure(s): ARTERIOVENOUS (AV) FISTULA CREATION VERSUS GRAFT (Right) as a surgical intervention .  The patient's history has been reviewed, patient examined, no change in status, stable for surgery.  I have reviewed the patient's chart and labs.  Questions were answered to the patient's satisfaction.     [name]

## 2018-06-08 NOTE — Progress Notes (Signed)
Report called to [place], RN took report, hard script sent with patient for oxy, spoke with Dr. Ola Spurr about BP, systolic BP >701-TY for patient to DC.  Rowe Pavy, RN

## 2018-06-08 NOTE — Anesthesia Postprocedure Evaluation (Signed)
Anesthesia Post Note  Patient: [name] James  Procedure(s) Performed: INSERTION OF ARTERIOVENOUS (AV) GORE-TEX GRAFT ARM USING 4-7MM X 45CM GORETEX GRAFT (Right Arm Lower)     Patient location during evaluation: PACU Anesthesia Type: MAC Level of consciousness: awake and alert Pain management: pain level controlled Vital Signs Assessment: post-procedure vital signs reviewed and stable Respiratory status: spontaneous breathing, nonlabored ventilation, respiratory function stable and patient connected to nasal cannula oxygen Cardiovascular status: stable and blood pressure returned to baseline Postop Assessment: no apparent nausea or vomiting Anesthetic complications: no    Last Vitals:  Vitals:   06/08/18 1625 06/08/18 1630  BP:  (!) 103/37  Pulse: 69 69  Resp: 18 (!) 21  Temp:    SpO2: 100% 100%    Last Pain:  Vitals:   06/08/18 1500  PainSc: 0-No pain                 Rylah Fukuda,W. EDMOND

## 2018-06-08 NOTE — Op Note (Signed)
    NAME: [name] James    MRN: [id number] DOB: Mar 16, 1936    DATE OF OPERATION: 06/08/2018  PREOP DIAGNOSIS:    End-stage renal disease   POSTOP DIAGNOSIS:    Same  PROCEDURE:    New right forearm AV graft (4-7 mm PTFE graft)  SURGEON: Judeth Cornfield. Scot Dock, MD, FACS  ASSIST: Arlee Muslim, PA  ANESTHESIA: Local with sedation  EBL: Minimal  INDICATIONS:    [name] James is a 82 y.o. 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  This has been intervened on multiple times.  She presents for placement of new access in the right arm.  FINDINGS:   Excellent thrill in the right forearm AV graft to the completion of the procedure with a good radial and ulnar signal with a Doppler.  The arterial aspect of the graft is along the radial aspect of the forearm.  TECHNIQUE:   The patient was taken to the operating room and sedated by anesthesia.  The right upper extremity was prepped and draped in the usual sterile fashion.  After the skin was anesthetized with 1% lidocaine, a longitudinal incision was made just above the antecubital level.  Here the brachial artery and adjacent brachial veins were dissected free.  Of note I did look at the cephalic vein and basilic vein with the SonoSite and this did not appear to be adequate for an AV fistula.  Using 1 distal counterincision after the skin was anesthetized a 4-7 mm PTFE graft was tunneled in a loop fashion in the form with the arterial aspect of the graft along the radial aspect of the forearm.  The patient was then heparinized.  The brachial artery was clamped proximally distally and a longitudinal arteriotomy was made.  A segment of the 4 mm in the graft was excised the graft slightly spatulated and sewn into side to the brachial artery using continuous 6-0 Prolene suture.  The graft was then pulled the appropriate length for anastomosis to the brachial vein.  The vein was ligated distally and spatulated  proximally.  The graft was cut the appropriate length, spatulated and sewn into into the vein using continuous 6-0 Prolene suture.  At the completion there was a good thrill in the graft and a radial and ulnar signal with the Doppler.  The heparin was partially reversed with protamine.  Each of the incisions was closed with a deep layer of 3-0 Vicryl and the skin closed with 4-0 Vicryl.  Dermabond was applied.  The patient tolerated the procedure well and was transferred to the recovery room in stable condition.  All needle and sponge counts were correct.  [name], MD, FACS [facility]  DATE OF DICTATION:   06/08/2018

## 2018-06-08 NOTE — Anesthesia Preprocedure Evaluation (Addendum)
Anesthesia Evaluation  Patient identified by MRN, date of birth, ID band Patient awake    Reviewed: Allergy & Precautions, H&P , NPO status , Patient's Chart, lab work & pertinent test results, reviewed documented beta blocker date and time   History of Anesthesia Complications (+) PONV  Airway Mallampati: II  TM Distance: >3 FB Neck ROM: Full    Dental no notable dental hx. (+) Partial Lower, Partial Upper, Dental Advisory Given   Pulmonary neg pulmonary ROS,    Pulmonary exam normal breath sounds clear to auscultation       Cardiovascular hypertension, Pt. on medications and Pt. on home beta blockers + Peripheral Vascular Disease and +CHF  + dysrhythmias Atrial Fibrillation  Rhythm:Regular Rate:Normal     Neuro/Psych Anxiety negative neurological ROS     GI/Hepatic Neg liver ROS, GERD  Controlled,  Endo/Other  diabetes  Renal/GU ESRF and DialysisRenal disease  negative genitourinary   Musculoskeletal  (+) Arthritis , Osteoarthritis,    Abdominal   Peds  Hematology negative hematology ROS (+) anemia ,   Anesthesia Other Findings   Reproductive/Obstetrics negative OB ROS                            Anesthesia Physical Anesthesia Plan  ASA: III  Anesthesia Plan: MAC   Post-op Pain Management:    Induction: Intravenous  PONV Risk Score and Plan: 4 or greater and Ondansetron, Propofol infusion and Treatment may vary due to age or medical condition  Airway Management Planned: Simple Face Mask  Additional Equipment:   Intra-op Plan:   Post-operative Plan:   Informed Consent: I have reviewed the patients History and Physical, chart, labs and discussed the procedure including the risks, benefits and alternatives for the proposed anesthesia with the patient or authorized representative who has indicated his/her understanding and acceptance.   Dental advisory given  Plan Discussed  with: CRNA  Anesthesia Plan Comments:         Anesthesia Quick Evaluation

## 2018-06-08 NOTE — Transfer of Care (Signed)
Immediate Anesthesia Transfer of Care Note  Patient: [name] James  Procedure(s) Performed: INSERTION OF ARTERIOVENOUS (AV) GORE-TEX GRAFT ARM USING 4-7MM X 45CM GORETEX GRAFT (Right Arm Lower)  Patient Location: PACU  Anesthesia Type:MAC  Level of Consciousness: awake, alert , oriented and sedated  Airway & Oxygen Therapy: Patient Spontanous Breathing and Patient connected to nasal cannula oxygen  Post-op Assessment: Report given to RN, Post -op Vital signs reviewed and stable and Patient moving all extremities  Post vital signs: Reviewed and stable  Last Vitals:  Vitals Value Taken Time  BP 104/47 06/08/2018  3:01 PM  Temp    Pulse 69 06/08/2018  3:05 PM  Resp 23 06/08/2018  3:05 PM  SpO2 100 % 06/08/2018  3:05 PM  Vitals shown include unvalidated device data.  Last Pain:  Vitals:   06/08/18 0927  PainSc: 0-No pain      Patients Stated Pain Goal: 6 (09/47/09 6283)  Complications: No apparent anesthesia complications

## 2018-06-09 ENCOUNTER — Telehealth: Payer: Self-pay | Admitting: Vascular Surgery

## 2018-06-09 ENCOUNTER — Encounter (HOSPITAL_COMMUNITY): Payer: Self-pay | Admitting: Vascular Surgery

## 2018-06-09 DIAGNOSIS — E119 Type 2 diabetes mellitus without complications: Secondary | ICD-10-CM | POA: Diagnosis not present

## 2018-06-09 DIAGNOSIS — D7589 Other specified diseases of blood and blood-forming organs: Secondary | ICD-10-CM | POA: Diagnosis not present

## 2018-06-09 DIAGNOSIS — E1122 Type 2 diabetes mellitus with diabetic chronic kidney disease: Secondary | ICD-10-CM | POA: Diagnosis not present

## 2018-06-09 DIAGNOSIS — D631 Anemia in chronic kidney disease: Secondary | ICD-10-CM | POA: Diagnosis not present

## 2018-06-09 DIAGNOSIS — N2581 Secondary hyperparathyroidism of renal origin: Secondary | ICD-10-CM | POA: Diagnosis not present

## 2018-06-09 DIAGNOSIS — N186 End stage renal disease: Secondary | ICD-10-CM | POA: Diagnosis not present

## 2018-06-09 DIAGNOSIS — Z9981 Dependence on supplemental oxygen: Secondary | ICD-10-CM | POA: Diagnosis not present

## 2018-06-09 DIAGNOSIS — D509 Iron deficiency anemia, unspecified: Secondary | ICD-10-CM | POA: Diagnosis not present

## 2018-06-09 NOTE — Telephone Encounter (Signed)
sch appt lvm 06/30/18 1pm p/o PA

## 2018-06-11 DIAGNOSIS — N186 End stage renal disease: Secondary | ICD-10-CM | POA: Diagnosis not present

## 2018-06-11 DIAGNOSIS — N2581 Secondary hyperparathyroidism of renal origin: Secondary | ICD-10-CM | POA: Diagnosis not present

## 2018-06-11 DIAGNOSIS — D631 Anemia in chronic kidney disease: Secondary | ICD-10-CM | POA: Diagnosis not present

## 2018-06-11 DIAGNOSIS — D509 Iron deficiency anemia, unspecified: Secondary | ICD-10-CM | POA: Diagnosis not present

## 2018-06-11 DIAGNOSIS — E1122 Type 2 diabetes mellitus with diabetic chronic kidney disease: Secondary | ICD-10-CM | POA: Diagnosis not present

## 2018-06-14 DIAGNOSIS — E1122 Type 2 diabetes mellitus with diabetic chronic kidney disease: Secondary | ICD-10-CM | POA: Diagnosis not present

## 2018-06-14 DIAGNOSIS — D509 Iron deficiency anemia, unspecified: Secondary | ICD-10-CM | POA: Diagnosis not present

## 2018-06-14 DIAGNOSIS — N2581 Secondary hyperparathyroidism of renal origin: Secondary | ICD-10-CM | POA: Diagnosis not present

## 2018-06-14 DIAGNOSIS — N186 End stage renal disease: Secondary | ICD-10-CM | POA: Diagnosis not present

## 2018-06-14 DIAGNOSIS — D631 Anemia in chronic kidney disease: Secondary | ICD-10-CM | POA: Diagnosis not present

## 2018-06-16 DIAGNOSIS — D631 Anemia in chronic kidney disease: Secondary | ICD-10-CM | POA: Diagnosis not present

## 2018-06-16 DIAGNOSIS — D509 Iron deficiency anemia, unspecified: Secondary | ICD-10-CM | POA: Diagnosis not present

## 2018-06-16 DIAGNOSIS — E1122 Type 2 diabetes mellitus with diabetic chronic kidney disease: Secondary | ICD-10-CM | POA: Diagnosis not present

## 2018-06-16 DIAGNOSIS — N2581 Secondary hyperparathyroidism of renal origin: Secondary | ICD-10-CM | POA: Diagnosis not present

## 2018-06-16 DIAGNOSIS — N186 End stage renal disease: Secondary | ICD-10-CM | POA: Diagnosis not present

## 2018-06-18 DIAGNOSIS — D631 Anemia in chronic kidney disease: Secondary | ICD-10-CM | POA: Diagnosis not present

## 2018-06-18 DIAGNOSIS — N186 End stage renal disease: Secondary | ICD-10-CM | POA: Diagnosis not present

## 2018-06-18 DIAGNOSIS — E1122 Type 2 diabetes mellitus with diabetic chronic kidney disease: Secondary | ICD-10-CM | POA: Diagnosis not present

## 2018-06-18 DIAGNOSIS — N2581 Secondary hyperparathyroidism of renal origin: Secondary | ICD-10-CM | POA: Diagnosis not present

## 2018-06-18 DIAGNOSIS — D509 Iron deficiency anemia, unspecified: Secondary | ICD-10-CM | POA: Diagnosis not present

## 2018-06-21 DIAGNOSIS — Z79899 Other long term (current) drug therapy: Secondary | ICD-10-CM | POA: Diagnosis not present

## 2018-06-21 DIAGNOSIS — E1122 Type 2 diabetes mellitus with diabetic chronic kidney disease: Secondary | ICD-10-CM | POA: Diagnosis not present

## 2018-06-21 DIAGNOSIS — N186 End stage renal disease: Secondary | ICD-10-CM | POA: Diagnosis not present

## 2018-06-21 DIAGNOSIS — Z992 Dependence on renal dialysis: Secondary | ICD-10-CM | POA: Diagnosis not present

## 2018-06-21 DIAGNOSIS — E08311 Diabetes mellitus due to underlying condition with unspecified diabetic retinopathy with macular edema: Secondary | ICD-10-CM | POA: Diagnosis not present

## 2018-06-22 DIAGNOSIS — N186 End stage renal disease: Secondary | ICD-10-CM | POA: Diagnosis not present

## 2018-06-22 DIAGNOSIS — D631 Anemia in chronic kidney disease: Secondary | ICD-10-CM | POA: Diagnosis not present

## 2018-06-22 DIAGNOSIS — E119 Type 2 diabetes mellitus without complications: Secondary | ICD-10-CM | POA: Diagnosis not present

## 2018-06-22 DIAGNOSIS — N2581 Secondary hyperparathyroidism of renal origin: Secondary | ICD-10-CM | POA: Diagnosis not present

## 2018-06-22 DIAGNOSIS — D509 Iron deficiency anemia, unspecified: Secondary | ICD-10-CM | POA: Diagnosis not present

## 2018-06-22 DIAGNOSIS — E1122 Type 2 diabetes mellitus with diabetic chronic kidney disease: Secondary | ICD-10-CM | POA: Diagnosis not present

## 2018-06-23 DIAGNOSIS — D631 Anemia in chronic kidney disease: Secondary | ICD-10-CM | POA: Diagnosis not present

## 2018-06-23 DIAGNOSIS — N186 End stage renal disease: Secondary | ICD-10-CM | POA: Diagnosis not present

## 2018-06-23 DIAGNOSIS — N2581 Secondary hyperparathyroidism of renal origin: Secondary | ICD-10-CM | POA: Diagnosis not present

## 2018-06-23 DIAGNOSIS — D509 Iron deficiency anemia, unspecified: Secondary | ICD-10-CM | POA: Diagnosis not present

## 2018-06-23 DIAGNOSIS — E1122 Type 2 diabetes mellitus with diabetic chronic kidney disease: Secondary | ICD-10-CM | POA: Diagnosis not present

## 2018-06-24 DIAGNOSIS — B373 Candidiasis of vulva and vagina: Secondary | ICD-10-CM | POA: Diagnosis not present

## 2018-06-25 DIAGNOSIS — E1122 Type 2 diabetes mellitus with diabetic chronic kidney disease: Secondary | ICD-10-CM | POA: Diagnosis not present

## 2018-06-25 DIAGNOSIS — D509 Iron deficiency anemia, unspecified: Secondary | ICD-10-CM | POA: Diagnosis not present

## 2018-06-25 DIAGNOSIS — N2581 Secondary hyperparathyroidism of renal origin: Secondary | ICD-10-CM | POA: Diagnosis not present

## 2018-06-25 DIAGNOSIS — N186 End stage renal disease: Secondary | ICD-10-CM | POA: Diagnosis not present

## 2018-06-25 DIAGNOSIS — D631 Anemia in chronic kidney disease: Secondary | ICD-10-CM | POA: Diagnosis not present

## 2018-06-28 DIAGNOSIS — I953 Hypotension of hemodialysis: Secondary | ICD-10-CM | POA: Diagnosis not present

## 2018-06-28 DIAGNOSIS — D509 Iron deficiency anemia, unspecified: Secondary | ICD-10-CM | POA: Diagnosis not present

## 2018-06-28 DIAGNOSIS — N2581 Secondary hyperparathyroidism of renal origin: Secondary | ICD-10-CM | POA: Diagnosis not present

## 2018-06-28 DIAGNOSIS — D631 Anemia in chronic kidney disease: Secondary | ICD-10-CM | POA: Diagnosis not present

## 2018-06-28 DIAGNOSIS — N186 End stage renal disease: Secondary | ICD-10-CM | POA: Diagnosis not present

## 2018-06-28 DIAGNOSIS — E1122 Type 2 diabetes mellitus with diabetic chronic kidney disease: Secondary | ICD-10-CM | POA: Diagnosis not present

## 2018-06-30 ENCOUNTER — Ambulatory Visit (INDEPENDENT_AMBULATORY_CARE_PROVIDER_SITE_OTHER): Payer: Self-pay | Admitting: Physician Assistant

## 2018-06-30 ENCOUNTER — Other Ambulatory Visit: Payer: Self-pay

## 2018-06-30 VITALS — Temp 98.0°F | Resp 16 | Ht 65.0 in | Wt 152.0 lb

## 2018-06-30 DIAGNOSIS — D631 Anemia in chronic kidney disease: Secondary | ICD-10-CM | POA: Diagnosis not present

## 2018-06-30 DIAGNOSIS — N186 End stage renal disease: Secondary | ICD-10-CM

## 2018-06-30 DIAGNOSIS — E1122 Type 2 diabetes mellitus with diabetic chronic kidney disease: Secondary | ICD-10-CM | POA: Diagnosis not present

## 2018-06-30 DIAGNOSIS — Z992 Dependence on renal dialysis: Secondary | ICD-10-CM

## 2018-06-30 DIAGNOSIS — D509 Iron deficiency anemia, unspecified: Secondary | ICD-10-CM | POA: Diagnosis not present

## 2018-06-30 DIAGNOSIS — N2581 Secondary hyperparathyroidism of renal origin: Secondary | ICD-10-CM | POA: Diagnosis not present

## 2018-06-30 DIAGNOSIS — F419 Anxiety disorder, unspecified: Secondary | ICD-10-CM | POA: Diagnosis not present

## 2018-06-30 NOTE — Progress Notes (Signed)
    Postoperative Access Visit   History of Present Illness   [name] James is a 82 y.o. year old female who presents for postoperative follow-up for: right forearm arteriovenous graft by Dr. Scot Dock (Date: 06/08/18).  The patient's wounds are healed.  The patient denies steal symptoms.  The patient currently resides at [facility] and is wheelchair bound.  The patient continues to dialyze from L arm access however this does not function consistently due to known outflow stenosis.   Physical Examination   Vitals:   06/30/18 1257  Resp: 16  Temp: 98 F (36.7 C)  TempSrc: Oral  SpO2: 100%  Weight: 152 lb (68.9 kg)  Height: 5\' 5"  (1.651 m)   Body mass index is 25.29 kg/m.  right arm Incisions are healed, still persistent pitting edema in R forearm however no areas of fluctuance, pain, or sign of infection, hand grip is 5/5, sensation in digits is intact, palpable thrill, bruit can be auscultated; palpable R radial pulse     Medical Decision Making   [name] James is a 82 y.o. year old female who presents s/p right forearm arteriovenous graft   The patient's access is not yet ready for use.  The edema of patient's right forearm will need to improve/resolve prior to using AV graft for dialysis  We will recheck in about 3 to 4 weeks  Continue HD from left arm; unfortunately patient would require temporary access if she is unable to dialyze from left arm access while we are waiting for edema and right forearm to improve/resolve   Dagoberto Ligas PA-C [facility] Office: [phone]

## 2018-07-02 DIAGNOSIS — D631 Anemia in chronic kidney disease: Secondary | ICD-10-CM | POA: Diagnosis not present

## 2018-07-02 DIAGNOSIS — N2581 Secondary hyperparathyroidism of renal origin: Secondary | ICD-10-CM | POA: Diagnosis not present

## 2018-07-02 DIAGNOSIS — N186 End stage renal disease: Secondary | ICD-10-CM | POA: Diagnosis not present

## 2018-07-02 DIAGNOSIS — D509 Iron deficiency anemia, unspecified: Secondary | ICD-10-CM | POA: Diagnosis not present

## 2018-07-02 DIAGNOSIS — E1122 Type 2 diabetes mellitus with diabetic chronic kidney disease: Secondary | ICD-10-CM | POA: Diagnosis not present

## 2018-07-05 ENCOUNTER — Encounter (HOSPITAL_COMMUNITY): Payer: Self-pay | Admitting: Emergency Medicine

## 2018-07-05 ENCOUNTER — Inpatient Hospital Stay (HOSPITAL_COMMUNITY)
Admission: EM | Admit: 2018-07-05 | Discharge: 2018-07-08 | DRG: 308 | Disposition: A | Discharge status: Skilled Nursing Facility | Payer: Medicare Other | Attending: Internal Medicine | Admitting: Internal Medicine

## 2018-07-05 ENCOUNTER — Emergency Department (HOSPITAL_COMMUNITY): Payer: Medicare Other

## 2018-07-05 ENCOUNTER — Other Ambulatory Visit: Payer: Self-pay

## 2018-07-05 DIAGNOSIS — E1140 Type 2 diabetes mellitus with diabetic neuropathy, unspecified: Secondary | ICD-10-CM | POA: Diagnosis present

## 2018-07-05 DIAGNOSIS — I272 Pulmonary hypertension, unspecified: Secondary | ICD-10-CM | POA: Diagnosis present

## 2018-07-05 DIAGNOSIS — N186 End stage renal disease: Secondary | ICD-10-CM | POA: Diagnosis not present

## 2018-07-05 DIAGNOSIS — E1122 Type 2 diabetes mellitus with diabetic chronic kidney disease: Secondary | ICD-10-CM | POA: Diagnosis present

## 2018-07-05 DIAGNOSIS — Z853 Personal history of malignant neoplasm of breast: Secondary | ICD-10-CM

## 2018-07-05 DIAGNOSIS — Z885 Allergy status to narcotic agent status: Secondary | ICD-10-CM

## 2018-07-05 DIAGNOSIS — Z880 Allergy status to penicillin: Secondary | ICD-10-CM

## 2018-07-05 DIAGNOSIS — I509 Heart failure, unspecified: Secondary | ICD-10-CM | POA: Diagnosis present

## 2018-07-05 DIAGNOSIS — Z7901 Long term (current) use of anticoagulants: Secondary | ICD-10-CM

## 2018-07-05 DIAGNOSIS — I959 Hypotension, unspecified: Secondary | ICD-10-CM | POA: Diagnosis not present

## 2018-07-05 DIAGNOSIS — R079 Chest pain, unspecified: Secondary | ICD-10-CM | POA: Diagnosis present

## 2018-07-05 DIAGNOSIS — Z881 Allergy status to other antibiotic agents status: Secondary | ICD-10-CM

## 2018-07-05 DIAGNOSIS — J9611 Chronic respiratory failure with hypoxia: Secondary | ICD-10-CM | POA: Diagnosis present

## 2018-07-05 DIAGNOSIS — I132 Hypertensive heart and chronic kidney disease with heart failure and with stage 5 chronic kidney disease, or end stage renal disease: Secondary | ICD-10-CM | POA: Diagnosis not present

## 2018-07-05 DIAGNOSIS — Z992 Dependence on renal dialysis: Secondary | ICD-10-CM | POA: Diagnosis not present

## 2018-07-05 DIAGNOSIS — R0602 Shortness of breath: Secondary | ICD-10-CM | POA: Diagnosis not present

## 2018-07-05 DIAGNOSIS — I953 Hypotension of hemodialysis: Secondary | ICD-10-CM | POA: Diagnosis present

## 2018-07-05 DIAGNOSIS — R001 Bradycardia, unspecified: Secondary | ICD-10-CM | POA: Diagnosis present

## 2018-07-05 DIAGNOSIS — Z9981 Dependence on supplemental oxygen: Secondary | ICD-10-CM

## 2018-07-05 DIAGNOSIS — Z79899 Other long term (current) drug therapy: Secondary | ICD-10-CM

## 2018-07-05 DIAGNOSIS — D631 Anemia in chronic kidney disease: Secondary | ICD-10-CM | POA: Diagnosis present

## 2018-07-05 DIAGNOSIS — R0789 Other chest pain: Secondary | ICD-10-CM | POA: Diagnosis not present

## 2018-07-05 DIAGNOSIS — I10 Essential (primary) hypertension: Secondary | ICD-10-CM | POA: Diagnosis not present

## 2018-07-05 DIAGNOSIS — Z7982 Long term (current) use of aspirin: Secondary | ICD-10-CM

## 2018-07-05 DIAGNOSIS — I480 Paroxysmal atrial fibrillation: Secondary | ICD-10-CM | POA: Diagnosis not present

## 2018-07-05 DIAGNOSIS — E1151 Type 2 diabetes mellitus with diabetic peripheral angiopathy without gangrene: Secondary | ICD-10-CM | POA: Diagnosis present

## 2018-07-05 DIAGNOSIS — G8929 Other chronic pain: Secondary | ICD-10-CM | POA: Diagnosis present

## 2018-07-05 DIAGNOSIS — N2581 Secondary hyperparathyroidism of renal origin: Secondary | ICD-10-CM | POA: Diagnosis present

## 2018-07-05 DIAGNOSIS — D638 Anemia in other chronic diseases classified elsewhere: Secondary | ICD-10-CM | POA: Diagnosis present

## 2018-07-05 DIAGNOSIS — Z7401 Bed confinement status: Secondary | ICD-10-CM

## 2018-07-05 DIAGNOSIS — Z794 Long term (current) use of insulin: Secondary | ICD-10-CM

## 2018-07-05 DIAGNOSIS — F419 Anxiety disorder, unspecified: Secondary | ICD-10-CM | POA: Diagnosis present

## 2018-07-05 DIAGNOSIS — D509 Iron deficiency anemia, unspecified: Secondary | ICD-10-CM | POA: Diagnosis not present

## 2018-07-05 HISTORY — DX: End stage renal disease: N18.6

## 2018-07-05 HISTORY — DX: Dependence on supplemental oxygen: Z99.81

## 2018-07-05 HISTORY — DX: Dependence on renal dialysis: Z99.2

## 2018-07-05 LAB — I-STAT TROPONIN, ED: TROPONIN I, POC: 0 ng/mL (ref 0.00–0.08)

## 2018-07-05 LAB — CBC
HCT: 37.9 % (ref 36.0–46.0)
Hemoglobin: 11.7 g/dL — ABNORMAL LOW (ref 12.0–15.0)
MCH: 32.7 pg (ref 26.0–34.0)
MCHC: 30.9 g/dL (ref 30.0–36.0)
MCV: 105.9 fL — ABNORMAL HIGH (ref 78.0–100.0)
Platelets: 259 10*3/uL (ref 150–400)
RBC: 3.58 MIL/uL — AB (ref 3.87–5.11)
RDW: 16.9 % — AB (ref 11.5–15.5)
WBC: 9.8 10*3/uL (ref 4.0–10.5)

## 2018-07-05 LAB — BASIC METABOLIC PANEL
Anion gap: 11 (ref 5–15)
BUN: 18 mg/dL (ref 8–23)
CALCIUM: 8.2 mg/dL — AB (ref 8.9–10.3)
CO2: 28 mmol/L (ref 22–32)
CREATININE: 2.82 mg/dL — AB (ref 0.44–1.00)
Chloride: 100 mmol/L (ref 98–111)
GFR, EST AFRICAN AMERICAN: 17 mL/min — AB (ref >60)
GFR, EST NON AFRICAN AMERICAN: 15 mL/min — AB (ref >60)
Glucose, Bld: 163 mg/dL — ABNORMAL HIGH (ref 70–99)
Potassium: 4.4 mmol/L (ref 3.5–5.1)
SODIUM: 139 mmol/L (ref 135–145)

## 2018-07-05 LAB — TYPE AND SCREEN
ABO/RH(D): O POS
ANTIBODY SCREEN: NEGATIVE

## 2018-07-05 LAB — PROTIME-INR
INR: 1.03
Prothrombin Time: 13.4 seconds (ref 11.4–15.2)

## 2018-07-05 MED ORDER — FENTANYL CITRATE (PF) 100 MCG/2ML IJ SOLN
12.5000 ug | Freq: Once | INTRAMUSCULAR | Status: AC
Start: 2018-07-05 — End: 2018-07-05
  Administered 2018-07-05: 12.5 ug via INTRAVENOUS
  Filled 2018-07-05: qty 2

## 2018-07-05 MED ORDER — SODIUM CHLORIDE 0.9 % IV BOLUS
500.0000 mL | Freq: Once | INTRAVENOUS | Status: AC
  Administered 2018-07-05: 500 mL via INTRAVENOUS

## 2018-07-05 NOTE — ED Notes (Signed)
Pt given ice chips per Dr.Plunkett

## 2018-07-05 NOTE — ED Triage Notes (Signed)
Pt BIB [facility] from [facility], pt c/o chest pain that started during dialysis. Pt states pain increased during the day. Pain radiates to the back and under the left breast. Pt normally on O2 @ 2L. Given 324mg  asa PTA.

## 2018-07-05 NOTE — ED Provider Notes (Signed)
[facility] EMERGENCY DEPARTMENT Provider Note   CSN: [id number] Arrival date & time: 07/05/18  2001     History   Chief Complaint Chief Complaint  Patient presents with  . Chest Pain    HPI [name] James is a 82 y.o. female.  Patient is an 82 year old female with a history of end-stage renal disease on dialysis Monday Wednesday Fridays, Barrett's esophagus, atrial fibrillation,Anemia, Diabetes, left pneumothorax often times will get a sharp pain in her chest that goes from left to right but today towards the end of dialysis she started to get a pressure on her chest that has continued since about 5:00.  It is gotten worse since she got back to the facility where she lives.  She describes as a tight heavy feeling over her chest that is worse when she takes a deep breath.  She also feels like it makes her short of breath.  Patient is complaining of abdominal pain but states she has chronic abdominal pain and nausea that she sees Dr. Hilarie Fredrickson for but nobody can seem to find out what is causing the issue.  Also during dialysis more recently she has been getting hypotensive during treatments.  She does take Midrin before she goes and during dialysis but states today her pressure dropped she was feeling very lightheaded and nauseated and they laid her back and it seemed to get better.  She has not missed any dialysis.  She has not had any recent lower extremity swelling but did have new graft placement about a month ago and her right upper extremity because her left graft was starting to stenosis.  She is currently still using her left upper extremity graft.  She denies cough, fever, diarrhea.  She has no prior history of coronary artery disease or stent placement.  Medication list states that she is on Coumadin but patient denies this and states she is now on half of the medications that are on her list.  The history is provided by the patient.  Chest Pain   This is a new  problem. The current episode started 3 to 5 hours ago. The problem occurs constantly. The problem has not changed since onset.Associated with: started during dialysis but got worse afterwards. Pain location: over the entire chest. The pain is severe. The quality of the pain is described as pressure-like. The pain does not radiate. Duration of episode(s) is 5 hours. The symptoms are aggravated by deep breathing. Associated symptoms include abdominal pain, dizziness, nausea, near-syncope and shortness of breath. Pertinent negatives include no cough, no fever, no leg pain, no lower extremity edema, no sputum production, no syncope and no vomiting. She has tried rest for the symptoms. The treatment provided no relief.    Past Medical History:  Diagnosis Date  . Anemia, chronic disease   . Anxiety   . Arthritis    knees  . Atrial fibrillation ([place])   . Barrett esophagus   . Benign paroxysmal positional vertigo   . Breast cancer ([place]) 1980s   bil mastectomies, no radiation or chemo  . Cataract   . Chest pain, atypical 12/05/2008   R/Lmv- normal perfusion all regions, noe ECG changes   . CHF (congestive heart failure) ([place]) 05/13/2011   echo - YQ>65%; stage 1 diastolic dysfunction; elevated LV filling pressure, MAC  . CKD (chronic kidney disease), stage III ([place])   . Claudication ([place]) 10/30/2005   doppler - normal evaluation, no evidence of aneurysm, diameter reduction, dissection, compression or  vascular abnormality)  . Complication of anesthesia   . Dyslipidemia   . Dysrhythmia    Atrial Fibrillation  . Edema   . ESRD (end stage renal disease) ([place])   . Family history of adverse reaction to anesthesia    son also has nausea   . Fever blister 12/2016  . Gastritis and gastroduodenitis   . GERD (gastroesophageal reflux disease)   . Gout   . Hemodialysis-associated hypotension   . HLD (hyperlipidemia)   . Hypertension   . IBS (irritable bowel syndrome)   . Insomnia   . Morbid obesity  ([place])   . PAF (paroxysmal atrial fibrillation) ([place])   . Peripheral neuropathy   . Pneumonia 12/2016  . PONV (postoperative nausea and vomiting)   . Renal cyst   . Renal insufficiency 10/30/2005   doppler - abn resistance consistent w/ parenchymal disease  . Renovascular hypertension   . Type 2 diabetes mellitus with diabetic neuropathy, with long-term current use of insulin ([place])   . Vertigo     Patient Active Problem List   Diagnosis Date Noted  . Pneumothorax, left 10/13/2017  . Multiple lung nodules on CT 10/06/2017  . Pleural effusion, left 10/06/2017  . Diabetic polyneuropathy associated with type 2 diabetes mellitus ([place]) 07/30/2017  . Onychomycosis of toenail 07/30/2017  . Idiopathic chronic venous hypertension of both lower extremities with inflammation 07/30/2017  . Chronic respiratory failure with hypoxia ([place]) 04/29/2017  . Morbid (severe) obesity due to excess calories ([place]) 04/29/2017  . LFT elevation   . Advance care planning   . Goals of care, counseling/discussion   . Palliative care by specialist   . Pressure ulcer 01/19/2017  . Long term current use of amiodarone 10/17/2016  . History of pressure ulcer 10/17/2016  . GAD (generalized anxiety disorder) 10/17/2016  . Osteopenia determined by x-ray 04/17/2016  . Controlled type 2 diabetes mellitus with diabetic polyneuropathy, with long-term current use of insulin ([place]) 04/17/2016  . Type 2 diabetes mellitus with diabetic neuropathy ([place]) 06/08/2015  . Type 2 diabetes, controlled, with renal manifestation ([place]) 06/08/2015  . PVD (peripheral vascular disease) ([place]) 06/08/2015  . Swelling of limb-Left arm 04/10/2015  . Nausea with vomiting 01/12/2015  . AP (abdominal pain) 01/12/2015  . Normocytic anemia 01/12/2015  . Paroxysmal atrial fibrillation ([place]) 12/22/2014  . Melena   . ESRD on dialysis ([place]) 12/19/2014  . CHF (congestive heart failure) ([place]) 12/19/2014  . Palpitation 12/19/2014  . Anemia, chronic  disease 12/13/2014  . Hemodialysis-associated hypotension 12/13/2014  . Pulmonary edema 11/10/2014  . Physical deconditioning 10/29/2014  . Iron deficiency anemia   . Gastritis and gastroduodenitis 10/24/2014  . Heme positive stool 10/24/2014  . Morbid obesity ([place]) 10/18/2014  . Edema 10/18/2014  . Essential hypertension 10/17/2014  . DM type 2, uncontrolled, with renal complications ([place]) 38/75/6433  . Dyslipidemia 10/17/2014  . Anemia 10/17/2014    Past Surgical History:  Procedure Laterality Date  . A/V FISTULAGRAM Left 03/19/2018   Procedure: A/V FISTULAGRAM;  Surgeon: Angelia Mould, MD;  Location: [facility] CV LAB;  Service: Cardiovascular;  Laterality: Left;  . A/V SHUNTOGRAM Left 01/19/2018   Procedure: A/V SHUNTOGRAM;  Surgeon: Serafina Mitchell, MD;  Location: [facility] CV LAB;  Service: Cardiovascular;  Laterality: Left;  . ANGIOPLASTY Left 02/12/2015   Procedure: ANGIOPLASTY;  Surgeon: Angelia Mould, MD;  Location: [facility] CATH LAB;  Service: Cardiovascular;  Laterality: Left;  AVF  . AV FISTULA PLACEMENT Left 11/30/2014   Procedure: ARTERIOVENOUS (  AV) FISTULA CREATION LEFT ARM;  Surgeon: Angelia Mould, MD;  Location: [facility];  Service: Vascular;  Laterality: Left;  . AV FISTULA PLACEMENT Left 03/20/2015   Procedure:  Inserton of Left Upper Arm Gortex Graft;  Surgeon: Angelia Mould, MD;  Location: [facility];  Service: Vascular;  Laterality: Left;  . AV FISTULA PLACEMENT Left 04/12/2015   Procedure: INSERTION OF LEFT ARM  ARTERIOVENOUS GORE-TEX GRAFT ;  Surgeon: Angelia Mould, MD;  Location: [facility];  Service: Vascular;  Laterality: Left;  . AV FISTULA PLACEMENT Right 06/08/2018   Procedure: INSERTION OF ARTERIOVENOUS (AV) GORE-TEX GRAFT ARM USING 4-7MM X 45CM GORETEX GRAFT;  Surgeon: Angelia Mould, MD;  Location: [facility];  Service: Vascular;  Laterality: Right;  . BACK SURGERY  ~1980  . CATARACT EXTRACTION Right   . CHOLECYSTECTOMY    .  ESOPHAGOGASTRODUODENOSCOPY N/A 10/24/2014   Procedure: ESOPHAGOGASTRODUODENOSCOPY (EGD);  Surgeon: Jerene Bears, MD;  Location[facility] ENDOSCOPY;  Service: Endoscopy;  Laterality: N/A;  . EXCHANGE OF A DIALYSIS CATHETER Left 11/30/2014   Procedure: EXCHANGE OF A DIALYSIS CATHETER, LEFT INTERNAL JUGULAR;  Surgeon: Angelia Mould, MD;  Location: [facility];  Service: Vascular;  Laterality: Left;  . EYE SURGERY Left    transplant  . FISTULOGRAM N/A 02/12/2015   Procedure: FISTULOGRAM;  Surgeon: Angelia Mould, MD;  Location: [facility] CATH LAB;  Service: Cardiovascular;  Laterality: N/A;  . HEMORRHOID SURGERY    . LIGATION OF ARTERIOVENOUS  FISTULA Left 03/20/2015   Procedure: LIGATION OF ARTERIOVENOUS  FISTULA;  Surgeon: Angelia Mould, MD;  Location: [facility];  Service: Vascular;  Laterality: Left;  Marland Kitchen MASTECTOMY Bilateral ~1982   bil breast reconstruction with implants  . ORIF TIBIA & FIBULA FRACTURES Left 2007   also had left non displaced malleolar fracture.   Marland Kitchen PERIPHERAL VASCULAR BALLOON ANGIOPLASTY Left 01/19/2018   Procedure: PERIPHERAL VASCULAR BALLOON ANGIOPLASTY;  Surgeon: Serafina Mitchell, MD;  Location: [facility] CV LAB;  Service: Cardiovascular;  Laterality: Left;  fistula  . PERIPHERAL VASCULAR BALLOON ANGIOPLASTY Left 03/19/2018   Procedure: PERIPHERAL VASCULAR BALLOON ANGIOPLASTY;  Surgeon: Angelia Mould, MD;  Location: [facility] CV LAB;  Service: Cardiovascular;  Laterality: Left;  UPPER ARM FISTULA  . REMOVAL OF GRAFT Left 04/12/2015   Procedure: REMOVAL OF LEFT ARM ARTERIOVENOUS GORE-TEX GRAFT;  Surgeon: Angelia Mould, MD;  Location: [facility];  Service: Vascular;  Laterality: Left;     OB History   None      Home Medications    Prior to Admission medications   Medication Sig Start Date End Date Taking? Authorizing Provider  acetaminophen (TYLENOL) 325 MG tablet Take 650 mg by mouth every 6 (six) hours as needed for mild pain or fever.     [provider]  allopurinol (ZYLOPRIM) 100 MG tablet Take 100 mg by mouth daily.    [provider]  amiodarone (PACERONE) 200 MG tablet Take 200 mg by mouth daily.  06/08/15   [name], MD  aspirin EC 81 MG tablet Take 81 mg by mouth daily.    [provider]  B Complex-C-Folic Acid (NEPHRO-VITE PO) Take 1 tablet by mouth daily.     [provider]  diazepam (VALIUM) 5 MG tablet Take one tablet by mouth every night at bedtime for rest Patient taking differently: Take 5 mg by mouth at bedtime.  02/04/17   Gildardo Cranker, DO  DIMETHICONE, TOPICAL, 5 % CREA Apply 1 application  topically 2 (two) times daily.    [provider]  dorzolamide-timolol (COSOPT) 22.3-6.8 MG/ML ophthalmic solution Place 1 drop into the left eye 2 (two) times daily. Wait 2-5 minutes when giving two eye meds    [provider]  fluconazole (DIFLUCAN) 100 MG tablet  06/24/18   [provider]  fluticasone (FLONASE ALLERGY RELIEF) 50 MCG/ACT nasal spray Place 2 sprays into both nostrils daily.    [provider]  Hydrocortisone (GERHARDT'S BUTT CREAM) CREA Apply 1 application topically 3 (three) times daily.    [provider]  loperamide (IMODIUM A-D) 2 MG tablet Take 2 mg by mouth 3 (three) times daily as needed for diarrhea or loose stools.    [provider]  loratadine (CLARITIN) 10 MG tablet Take 10 mg by mouth daily.    [provider]  Melatonin 5 MG TABS Take 5 mg by mouth at bedtime.     [provider]  metoprolol tartrate (LOPRESSOR) 25 MG tablet Take 0.5 tablets (12.5 mg total) by mouth 2 (two) times daily. Hold if sbp less than 100 or heart rate less than 50, hold on dialysis day. 02/03/17   Florencia Reasons, MD  midodrine (PROAMATINE) 10 MG tablet Take 10 mg by mouth See admin instructions. Take 10 mg by mouth in the morning and take 10 mg by mouth at dialysis on Monday, Wednesday and Friday.    [provider]    oxyCODONE (ROXICODONE) 5 MG immediate release tablet Take 1 tablet (5 mg total) by mouth every 4 (four) hours as needed for severe pain. 06/08/18   Angelia Mould, MD  OXYGEN Inhale 2 L/min into the lungs. 24/7    [provider]  Polyethyl Glycol-Propyl Glycol (SYSTANE ULTRA) 0.4-0.3 % SOLN Place 1 drop into both eyes 3 (three) times daily.    [provider]  promethazine (PHENERGAN) 25 MG tablet Take 25 mg by mouth every 8 (eight) hours as needed for nausea or vomiting.    [provider]  warfarin (COUMADIN) 4 MG tablet  06/24/18   [provider]    Family History Family History  Problem Relation Age of Onset  . Diabetes Mother   . Heart disease Mother   . Non-Hodgkin's lymphoma Mother   . Heart attack Father   . Stroke Father   . Parkinson's disease Brother   . Lung disease Brother   . Heart disease Brother   . Multiple myeloma Sister     Social History Social History   Tobacco Use  . Smoking status: Never Smoker  . Smokeless tobacco: Never Used  Substance Use Topics  . Alcohol use: No  . Drug use: No     Allergies   Erythromycin; Penicillins; Codeine; Erythromycin base; Penicillin g; and Zantac [ranitidine hcl]   Review of Systems Review of Systems  Constitutional: Negative for fever.  Respiratory: Positive for shortness of breath. Negative for cough and sputum production.   Cardiovascular: Positive for chest pain and near-syncope. Negative for syncope.  Gastrointestinal: Positive for abdominal pain and nausea. Negative for vomiting.  Neurological: Positive for dizziness.  All other systems reviewed and are negative.    Physical Exam Updated Vital Signs BP (!) 91/25 (BP Location: Right Leg)   Pulse (!) 50   Resp 17   SpO2 100%   Physical Exam  Constitutional: She is oriented to person, place, and time. She appears well-developed and well-nourished. No distress.  HENT:  Head: Normocephalic and atraumatic.  Mouth/Throat: Oropharynx is clear and moist.  Eyes: Pupils are equal, round, and reactive to light. EOM are normal.  Pale conjunctive a  Neck: Normal range of motion. Neck supple.  Cardiovascular: Regular rhythm and intact distal pulses. Bradycardia present.  No murmur heard. Pulmonary/Chest: Effort normal and breath sounds normal. No respiratory distress. She has no wheezes. She has no rales.  Abdominal: Soft. She exhibits no distension. There is no tenderness. There is no rebound and no guarding.  Mild diffuse abd pain on palpation but no focal pain.  Musculoskeletal: Normal range of motion. She exhibits edema.  Occasional ecchymosis.  No notable swelling.  Contractures present of the lower extremity with patient's left upper extremity with skin tear with mild oozing over the left elbow.  Fistula present with surrounding upper extremity edema.  Right upper extremity with maturing graft with edema present.  Neurological: She is alert and oriented to person, place, and time.  Skin: Skin is warm and dry. No rash noted. No erythema. There is pallor.  Psychiatric: She has a normal mood and affect. Her behavior is normal.  Nursing note and vitals reviewed.    ED Treatments / Results  Labs (all labs ordered are listed, but only abnormal results are displayed) Labs Reviewed  BASIC METABOLIC PANEL - Abnormal; Notable for the following components:      Result Value   Glucose, Bld 163 (*)    Creatinine, Ser 2.82 (*)    Calcium 8.2 (*)    GFR calc non Af Amer 15 (*)    GFR calc Af Amer 17 (*)    All other components within normal limits  CBC - Abnormal; Notable for the following components:   RBC 3.58 (*)    Hemoglobin 11.7 (*)    MCV 105.9 (*)    RDW 16.9 (*)    All other components within normal limits  PROTIME-INR  I-STAT TROPONIN, ED  TYPE AND SCREEN    EKG EKG Interpretation  Date/Time:  Monday July 05 2018 20:21:52 EDT Ventricular Rate:  46 PR Interval:    QRS  Duration: 101 QT Interval:  493 QTC Calculation: 432 R Axis:   43 Text Interpretation:  Sinus bradycardia Confirmed by Blanchie Dessert [phone]) on 07/05/2018 8:27:04 PM   Radiology Dg Chest 2 View  Result Date: 07/05/2018 CLINICAL DATA:  Chest pain starting during dialysis radiating to the back EXAM: CHEST - 2 VIEW COMPARISON:  Chest CT from 01/05/2018 FINDINGS: Atherosclerotic calcification of the aortic arch. Blunted left costophrenic angle with left basilar opacity favoring pleural effusion with passive atelectasis. Right lung appears clear. The pulmonary nodule seen on prior chest CT are not readily apparent on conventional radiography today. Heart size within normal limits. Thoracic spondylosis. IMPRESSION: 1. Heart size within normal limits. 2. Stable left pleural effusion with passive atelectasis. 3. Thoracic spondylosis. 4.  Atherosclerotic calcification of the aortic arch. 5. Conventional radiography does not exclude the possibility of aortic dissection. Electronically Signed   By: Van Clines M.D.   On: 07/05/2018 20:53    Procedures Procedures (including critical care time)  Medications Ordered in ED Medications - No data to display   Initial Impression / Assessment and Plan / ED Course  I have reviewed the triage vital signs and the nursing notes.  Pertinent labs & imaging results that were available during my care of the patient were reviewed by me and considered in my medical decision making (see chart for details).     Elderly pt with multiple  med problems presenting with chest pain.  Pain could have multiple etiologies given pt's extensive med problems including ACS, PE, Pulmonary edema, GI origin, anemia.  Pt was on coumadin but states is no longer on coumadin.  Recent new graft placed in RUE and possible causing worsening anemia. VS persistent hypotension causing strain as pt does take midodrine for hypotension during dialysis and had a bed "spell" today.  Pt  hypotensive here to 90/25 but has to be taken in the leg due to bilateral upper ext restriction.  Pt states it is that before dialysis often so maybe baseline. Pt is bradycardic but no signs of MI.  CXR with stable pleural effusion but no cardiomegaly or other acute changes.  CBC, BMP, type and screen, trop, INR pending.  10:38 PM CBC with Hb of 11 and normal WBC,  BMP without electrolyte disturbance.  INR 1 and trop neg after 5 hours of constant pain.  Pt BP continues to be 90/30 in the left thigh.  Per office note in June pt BP was 136/70 and HR in the 70's.  Pt's HR continues to be in the 40's here and possibly medication related as she does take metoprolol and still may be related to hypotension.  Will give IVF bolus.  Will treat pain but feel needs admission for med adjustment and cardiac r/o.  BP after bolus is 106/67 and HR remains brady.  Pain resolved after fentanyl.  Will also send TSH and repeat trop.  Final Clinical Impressions(s) / ED Diagnoses   Final diagnoses:  Nonspecific chest pain  Hypotension, unspecified hypotension type  Bradycardia    ED Discharge Orders    None       Blanchie Dessert, MD 07/06/18 [phone]

## 2018-07-05 NOTE — ED Notes (Signed)
Patient transported to X-ray 

## 2018-07-06 ENCOUNTER — Encounter (HOSPITAL_COMMUNITY): Payer: Self-pay | Admitting: General Practice

## 2018-07-06 ENCOUNTER — Observation Stay (HOSPITAL_BASED_OUTPATIENT_CLINIC_OR_DEPARTMENT_OTHER): Payer: Medicare Other

## 2018-07-06 DIAGNOSIS — I361 Nonrheumatic tricuspid (valve) insufficiency: Secondary | ICD-10-CM

## 2018-07-06 DIAGNOSIS — Z992 Dependence on renal dialysis: Secondary | ICD-10-CM

## 2018-07-06 DIAGNOSIS — R079 Chest pain, unspecified: Secondary | ICD-10-CM | POA: Diagnosis not present

## 2018-07-06 DIAGNOSIS — R001 Bradycardia, unspecified: Secondary | ICD-10-CM | POA: Diagnosis present

## 2018-07-06 DIAGNOSIS — E1140 Type 2 diabetes mellitus with diabetic neuropathy, unspecified: Secondary | ICD-10-CM | POA: Diagnosis not present

## 2018-07-06 DIAGNOSIS — N186 End stage renal disease: Secondary | ICD-10-CM | POA: Diagnosis not present

## 2018-07-06 DIAGNOSIS — I959 Hypotension, unspecified: Secondary | ICD-10-CM | POA: Diagnosis not present

## 2018-07-06 DIAGNOSIS — I480 Paroxysmal atrial fibrillation: Secondary | ICD-10-CM | POA: Diagnosis not present

## 2018-07-06 DIAGNOSIS — R072 Precordial pain: Secondary | ICD-10-CM

## 2018-07-06 LAB — CBC WITH DIFFERENTIAL/PLATELET
Abs Immature Granulocytes: 0 10*3/uL (ref 0.0–0.1)
BASOS ABS: 0.1 10*3/uL (ref 0.0–0.1)
BASOS PCT: 1 %
EOS ABS: 0.2 10*3/uL (ref 0.0–0.7)
Eosinophils Relative: 2 %
HCT: 35.5 % — ABNORMAL LOW (ref 36.0–46.0)
Hemoglobin: 10.5 g/dL — ABNORMAL LOW (ref 12.0–15.0)
IMMATURE GRANULOCYTES: 0 %
Lymphocytes Relative: 39 %
Lymphs Abs: 4 10*3/uL (ref 0.7–4.0)
MCH: 31.9 pg (ref 26.0–34.0)
MCHC: 29.6 g/dL — ABNORMAL LOW (ref 30.0–36.0)
MCV: 107.9 fL — AB (ref 78.0–100.0)
MONOS PCT: 10 %
Monocytes Absolute: 1.1 10*3/uL — ABNORMAL HIGH (ref 0.1–1.0)
NEUTROS PCT: 48 %
Neutro Abs: 5 10*3/uL (ref 1.7–7.7)
Platelets: 242 10*3/uL (ref 150–400)
RBC: 3.29 MIL/uL — AB (ref 3.87–5.11)
RDW: 17 % — AB (ref 11.5–15.5)
WBC: 10.4 10*3/uL (ref 4.0–10.5)

## 2018-07-06 LAB — MRSA PCR SCREENING: MRSA BY PCR: POSITIVE — AB

## 2018-07-06 LAB — BASIC METABOLIC PANEL
ANION GAP: 12 (ref 5–15)
BUN: 23 mg/dL (ref 8–23)
CO2: 25 mmol/L (ref 22–32)
Calcium: 7.9 mg/dL — ABNORMAL LOW (ref 8.9–10.3)
Chloride: 103 mmol/L (ref 98–111)
Creatinine, Ser: 3.21 mg/dL — ABNORMAL HIGH (ref 0.44–1.00)
GFR calc Af Amer: 15 mL/min — ABNORMAL LOW (ref >60)
GFR calc non Af Amer: 13 mL/min — ABNORMAL LOW (ref >60)
Glucose, Bld: 154 mg/dL — ABNORMAL HIGH (ref 70–99)
Potassium: 4.6 mmol/L (ref 3.5–5.1)
SODIUM: 140 mmol/L (ref 135–145)

## 2018-07-06 LAB — TROPONIN I
Troponin I: <0.03 ng/mL (ref <0.03)
Troponin I: <0.03 ng/mL (ref <0.03)

## 2018-07-06 LAB — I-STAT TROPONIN, ED: TROPONIN I, POC: 0.01 ng/mL (ref 0.00–0.08)

## 2018-07-06 LAB — ECHOCARDIOGRAM COMPLETE: HEIGHTINCHES: 65 in

## 2018-07-06 LAB — GLUCOSE, CAPILLARY
GLUCOSE-CAPILLARY: 153 mg/dL — AB (ref 70–99)
Glucose-Capillary: 139 mg/dL — ABNORMAL HIGH (ref 70–99)

## 2018-07-06 LAB — TSH: TSH: 2.099 u[IU]/mL (ref 0.350–4.500)

## 2018-07-06 MED ORDER — ACETAMINOPHEN 325 MG PO TABS: 650.0000 mg | ORAL_TABLET | ORAL | Status: DC | PRN

## 2018-07-06 MED ORDER — ASPIRIN 81 MG PO CHEW
324.0000 mg | CHEWABLE_TABLET | Freq: Once | ORAL | Status: DC
  Filled 2018-07-06: qty 4

## 2018-07-06 MED ORDER — HEPARIN SODIUM (PORCINE) 5000 UNIT/ML IJ SOLN
5000.0000 [IU] | Freq: Three times a day (TID) | INTRAMUSCULAR | Status: DC
  Administered 2018-07-06 – 2018-07-08 (×5): 5000 [IU] via SUBCUTANEOUS
  Filled 2018-07-06 (×6): qty 1

## 2018-07-06 MED ORDER — FLUCONAZOLE 150 MG PO TABS
150.0000 mg | ORAL_TABLET | ORAL | Status: DC
  Administered 2018-07-07: 150 mg via ORAL
  Filled 2018-07-06 (×2): qty 1

## 2018-07-06 MED ORDER — GI COCKTAIL ~~LOC~~: 30.0000 mL | Freq: Four times a day (QID) | ORAL | Status: DC | PRN

## 2018-07-06 MED ORDER — ALLOPURINOL 100 MG PO TABS
100.0000 mg | ORAL_TABLET | Freq: Every day | ORAL | Status: DC
  Administered 2018-07-06 – 2018-07-08 (×3): 100 mg via ORAL
  Filled 2018-07-06 (×3): qty 1

## 2018-07-06 MED ORDER — MIDODRINE HCL 5 MG PO TABS
10.0000 mg | ORAL_TABLET | ORAL | Status: DC
  Administered 2018-07-07: 10 mg via ORAL
  Filled 2018-07-06: qty 2

## 2018-07-06 MED ORDER — HYPROMELLOSE (GONIOSCOPIC) 2.5 % OP SOLN
1.0000 [drp] | Freq: Three times a day (TID) | OPHTHALMIC | Status: DC
  Administered 2018-07-07: 1 [drp] via OPHTHALMIC
  Filled 2018-07-06: qty 15

## 2018-07-06 MED ORDER — PROMETHAZINE HCL 25 MG PO TABS: 25.0000 mg | ORAL_TABLET | Freq: Three times a day (TID) | ORAL | Status: DC | PRN

## 2018-07-06 MED ORDER — DIMETHICONE 5 % EX CREA: 1.0000 "application " | TOPICAL_CREAM | CUTANEOUS | Status: DC

## 2018-07-06 MED ORDER — NEPRO/CARBSTEADY PO LIQD
237.0000 mL | Freq: Two times a day (BID) | ORAL | Status: DC
  Administered 2018-07-06: 237 mL via ORAL

## 2018-07-06 MED ORDER — BARRIER CREAM NON-SPECIFIED
1.0000 "application " | TOPICAL_CREAM | Freq: Two times a day (BID) | TOPICAL | Status: DC
  Administered 2018-07-06 – 2018-07-08 (×5): 1 via TOPICAL
  Filled 2018-07-06 (×2): qty 1

## 2018-07-06 MED ORDER — CHLORHEXIDINE GLUCONATE CLOTH 2 % EX PADS
6.0000 | MEDICATED_PAD | Freq: Every day | CUTANEOUS | Status: DC
  Administered 2018-07-07: 6 via TOPICAL

## 2018-07-06 MED ORDER — MUPIROCIN 2 % EX OINT
1.0000 "application " | TOPICAL_OINTMENT | Freq: Two times a day (BID) | CUTANEOUS | Status: DC
  Administered 2018-07-06 – 2018-07-08 (×4): 1 via NASAL
  Filled 2018-07-06 (×3): qty 22

## 2018-07-06 MED ORDER — FENTANYL CITRATE (PF) 100 MCG/2ML IJ SOLN: 12.5000 ug | INTRAMUSCULAR | Status: DC | PRN

## 2018-07-06 MED ORDER — DORZOLAMIDE HCL-TIMOLOL MAL 2-0.5 % OP SOLN
1.0000 [drp] | Freq: Two times a day (BID) | OPHTHALMIC | Status: DC
  Administered 2018-07-06 – 2018-07-08 (×6): 1 [drp] via OPHTHALMIC
  Filled 2018-07-06: qty 10

## 2018-07-06 MED ORDER — ASPIRIN 81 MG PO CHEW
81.0000 mg | CHEWABLE_TABLET | Freq: Every day | ORAL | Status: DC
  Administered 2018-07-06 – 2018-07-08 (×3): 81 mg via ORAL
  Filled 2018-07-06 (×3): qty 1

## 2018-07-06 MED ORDER — DIAZEPAM 5 MG PO TABS
5.0000 mg | ORAL_TABLET | Freq: Every day | ORAL | Status: DC
Start: 2018-07-06 — End: 2018-07-08
  Administered 2018-07-06 – 2018-07-07 (×3): 5 mg via ORAL
  Filled 2018-07-06 (×3): qty 1

## 2018-07-06 MED ORDER — ONDANSETRON HCL 4 MG/2ML IJ SOLN
4.0000 mg | Freq: Four times a day (QID) | INTRAMUSCULAR | Status: DC | PRN
  Filled 2018-07-06: qty 2

## 2018-07-06 MED ORDER — POLYVINYL ALCOHOL 1.4 % OP SOLN
1.0000 [drp] | Freq: Three times a day (TID) | OPHTHALMIC | Status: DC
  Filled 2018-07-06 (×2): qty 15

## 2018-07-06 MED ORDER — OXYCODONE HCL 5 MG PO TABS: 5.0000 mg | ORAL_TABLET | ORAL | Status: DC | PRN

## 2018-07-06 NOTE — Consult Note (Addendum)
Cardiology Consultation:   Patient ID: [name] James; [id number]; 01-Mar-1936   Admit date: 07/05/2018 Date of Consult: 07/06/2018  Primary Care Provider: [name], DO Primary Cardiologist: Sanda Klein, MD  Primary Electrophysiologist:     Patient Profile:   TAILA BASINSKI is a 82 y.o. female with a hx of ESRD on HD, paroxysmal atrial fibrillation not on [facility] for bleeding risk (GI bleed) on amiodarone, DM, chronic respiratory failure on home O2, HTN, aortic and CAD, normal nuc 2015, normal LVEF 2015 who is being seen today for the evaluation of chest pain during HD at the request of Dr. Erlinda Hong.  History of Present Illness:   Ms. Tortorelli last saw Kerin R[facility] in clinic on 04/06/18. She was doing well at that time, living at [facility], and had not missed any HD sessions. She has been doing well on amiodarone and noted to be in sinus rhythm since 2015.  She presents to [facility] with chest pain that started during HD. Chest pain was located in her central chest and radiated around both sides to the center of her back. The pain was rated as a 10/10 and waxed and waned over night and still bothers her today. She became shortn of breath, diaphoretic, and nauseous. She states she generally gets nauseous at HD. This pain felt like an elephant on her chest. She also battles hypotension on HD days and was hypotensive yesterday. She is bradycardic on exam. She is unsure what her baseline HR is or if it changes with HD sessions. She has not received nitro. She received a benzo overnight that helped her pain. She lives at a facility and is bed-bound/wheelchair-dependent.   Past Medical History:  Diagnosis Date  . Anemia, chronic disease   . Anxiety   . Arthritis    knees  . Atrial fibrillation ([place])   . Barrett esophagus   . Benign paroxysmal positional vertigo   . Breast cancer ([place]) 1980s   bil mastectomies, no radiation or chemo  . Cataract   . Chest pain, atypical 12/05/2008   R/Lmv-  normal perfusion all regions, noe ECG changes   . CHF (congestive heart failure) ([place]) 05/13/2011   echo - VZ>85%; stage 1 diastolic dysfunction; elevated LV filling pressure, MAC  . CKD (chronic kidney disease), stage III ([place])   . Claudication ([place]) 10/30/2005   doppler - normal evaluation, no evidence of aneurysm, diameter reduction, dissection, compression or vascular abnormality)  . Complication of anesthesia   . Dyslipidemia   . Dysrhythmia    Atrial Fibrillation  . Edema   . ESRD (end stage renal disease) ([place])   . Family history of adverse reaction to anesthesia    son also has nausea   . Fever blister 12/2016  . Gastritis and gastroduodenitis   . GERD (gastroesophageal reflux disease)   . Gout   . Hemodialysis-associated hypotension   . HLD (hyperlipidemia)   . Hypertension   . IBS (irritable bowel syndrome)   . Insomnia   . Morbid obesity ([place])   . PAF (paroxysmal atrial fibrillation) ([place])   . Peripheral neuropathy   . Pneumonia 12/2016  . PONV (postoperative nausea and vomiting)   . Renal cyst   . Renal insufficiency 10/30/2005   doppler - abn resistance consistent w/ parenchymal disease  . Renovascular hypertension   . Type 2 diabetes mellitus with diabetic neuropathy, with long-term current use of insulin ([place])   . Vertigo     Past Surgical History:  Procedure Laterality  Date  . A/V FISTULAGRAM Left 03/19/2018   Procedure: A/V FISTULAGRAM;  Surgeon: Angelia Mould, MD;  Location: [place] CV LAB;  Service: Cardiovascular;  Laterality: Left;  . A/V SHUNTOGRAM Left 01/19/2018   Procedure: A/V SHUNTOGRAM;  Surgeon: Serafina Mitchell, MD;  Location: [place] CV LAB;  Service: Cardiovascular;  Laterality: Left;  . ANGIOPLASTY Left 02/12/2015   Procedure: ANGIOPLASTY;  Surgeon: Angelia Mould, MD;  Location: [facility] CATH LAB;  Service: Cardiovascular;  Laterality: Left;  AVF  . AV FISTULA PLACEMENT Left 11/30/2014   Procedure: ARTERIOVENOUS (AV) FISTULA  CREATION LEFT ARM;  Surgeon: Angelia Mould, MD;  Location: [place];  Service: Vascular;  Laterality: Left;  . AV FISTULA PLACEMENT Left 03/20/2015   Procedure:  Inserton of Left Upper Arm Gortex Graft;  Surgeon: Angelia Mould, MD;  Location: [place];  Service: Vascular;  Laterality: Left;  . AV FISTULA PLACEMENT Left 04/12/2015   Procedure: INSERTION OF LEFT ARM  ARTERIOVENOUS GORE-TEX GRAFT ;  Surgeon: Angelia Mould, MD;  Location: [place];  Service: Vascular;  Laterality: Left;  . AV FISTULA PLACEMENT Right 06/08/2018   Procedure: INSERTION OF ARTERIOVENOUS (AV) GORE-TEX GRAFT ARM USING 4-7MM X 45CM GORETEX GRAFT;  Surgeon: Angelia Mould, MD;  Location: [place];  Service: Vascular;  Laterality: Right;  . BACK SURGERY  ~1980  . CATARACT EXTRACTION Right   . CHOLECYSTECTOMY    . ESOPHAGOGASTRODUODENOSCOPY N/A 10/24/2014   Procedure: ESOPHAGOGASTRODUODENOSCOPY (EGD);  Surgeon: Jerene Bears, MD;  Location: [facility] ENDOSCOPY;  Service: Endoscopy;  Laterality: N/A;  . EXCHANGE OF A DIALYSIS CATHETER Left 11/30/2014   Procedure: EXCHANGE OF A DIALYSIS CATHETER, LEFT INTERNAL JUGULAR;  Surgeon: Angelia Mould, MD;  Location: [place];  Service: Vascular;  Laterality: Left;  . EYE SURGERY Left    transplant  . FISTULOGRAM N/A 02/12/2015   Procedure: FISTULOGRAM;  Surgeon: Angelia Mould, MD;  Location: [facility] CATH LAB;  Service: Cardiovascular;  Laterality: N/A;  . HEMORRHOID SURGERY    . LIGATION OF ARTERIOVENOUS  FISTULA Left 03/20/2015   Procedure: LIGATION OF ARTERIOVENOUS  FISTULA;  Surgeon: Angelia Mould, MD;  Location: [place];  Service: Vascular;  Laterality: Left;  Marland Kitchen MASTECTOMY Bilateral ~1982   bil breast reconstruction with implants  . ORIF TIBIA & FIBULA FRACTURES Left 2007   also had left non displaced malleolar fracture.   Marland Kitchen PERIPHERAL VASCULAR BALLOON ANGIOPLASTY Left 01/19/2018   Procedure: PERIPHERAL VASCULAR BALLOON ANGIOPLASTY;  Surgeon: Serafina Mitchell, MD;  Location: [place] CV LAB;  Service: Cardiovascular;  Laterality: Left;  fistula  . PERIPHERAL VASCULAR BALLOON ANGIOPLASTY Left 03/19/2018   Procedure: PERIPHERAL VASCULAR BALLOON ANGIOPLASTY;  Surgeon: Angelia Mould, MD;  Location: [place] CV LAB;  Service: Cardiovascular;  Laterality: Left;  UPPER ARM FISTULA  . REMOVAL OF GRAFT Left 04/12/2015   Procedure: REMOVAL OF LEFT ARM ARTERIOVENOUS GORE-TEX GRAFT;  Surgeon: Angelia Mould, MD;  Location: [place];  Service: Vascular;  Laterality: Left;     Home Medications:  Prior to Admission medications   Medication Sig Start Date End Date Taking? Authorizing Provider  acetaminophen (TYLENOL) 325 MG tablet Take 650 mg by mouth every 6 (six) hours as needed for fever ("for vomiting/nausea," per [facility]).    Yes [provider]  allopurinol (ZYLOPRIM) 100 MG tablet Take 100 mg by mouth daily.   Yes [provider]  amiodarone (PACERONE) 200 MG tablet Take 200 mg by mouth daily.  06/08/15  Yes [name], MD  aspirin 81 MG chewable tablet Chew 81 mg by mouth daily.   Yes [provider]  B Complex-C-Folic Acid (NEPHRO VITAMINS) 0.8 MG TABS Take 1 tablet by mouth daily.   Yes [provider]  diazepam (VALIUM) 5 MG tablet Take one tablet by mouth every night at bedtime for rest Patient taking differently: Take 5 mg by mouth at bedtime.  02/04/17  Yes Carter, Monica, DO  DIMETHICONE, TOPICAL, 5 % CREA Apply 1 application topically See admin instructions. Place a nickel-sized amount onto buttock(s) area 2 times a day for barrier protection   Yes [provider]  dorzolamide-timolol (COSOPT) 22.3-6.8 MG/ML ophthalmic solution Place 1 drop into the left eye 2 (two) times daily. Wait 2-5 minutes when giving two eye meds   Yes [provider]  fluconazole (DIFLUCAN) 150 MG tablet Take 150 mg by mouth See admin instructions. Take 150 mg by mouth once a day on dialysis days only for 7  days for vaginal candidiasis 06/24/18 07/09/18 Yes [provider]  loperamide (IMODIUM A-D) 2 MG tablet Take 2 mg by mouth 3 (three) times daily as needed (for diarrhea).    Yes [provider]  loratadine (CLARITIN) 10 MG tablet Take 10 mg by mouth daily.   Yes [provider]  Melatonin 5 MG TABS Take 5 mg by mouth at bedtime.    Yes [provider]  midodrine (PROAMATINE) 10 MG tablet Take 10 mg by mouth See admin instructions. Take 10 mg by mouth WITH MORNING MEDS AT FACILITY on Mon/Wed/Fri- dialysis days   Yes [provider]  midodrine (PROAMATINE) 5 MG tablet Take 5 mg by mouth See admin instructions. Take 2 tablets (10 mg) TO DIALYSIS on Mon/Wed/Fri to be dispensed by staff as directed (Dx code: I48.0: Paroxysmal fibrillation)   Yes [provider]  oxyCODONE (ROXICODONE) 5 MG immediate release tablet Take 1 tablet (5 mg total) by mouth every 4 (four) hours as needed for severe pain. Patient taking differently: Take 5 mg by mouth every 4 (four) hours as needed (for pain).  06/08/18  Yes Angelia Mould, MD  OXYGEN Inhale 2 L into the lungs continuous.    Yes [provider]  Polyethyl Glycol-Propyl Glycol (SYSTANE ULTRA) 0.4-0.3 % SOLN Place 1 drop into both eyes 3 (three) times daily.   Yes [provider]  promethazine (PHENERGAN) 25 MG tablet Take 25 mg by mouth every 8 (eight) hours as needed for nausea or vomiting.   Yes [provider]  fluticasone (FLONASE ALLERGY RELIEF) 50 MCG/ACT nasal spray Place 2 sprays into both nostrils daily.    [provider]  Hydrocortisone (GERHARDT'S BUTT CREAM) CREA Apply 1 application topically 3 (three) times daily.    [provider]  metoprolol tartrate (LOPRESSOR) 25 MG tablet Take 0.5 tablets (12.5 mg total) by mouth 2 (two) times daily. Hold if sbp less than 100 or heart rate less than 50, hold on dialysis day. Patient not taking: Reported on  07/05/2018 02/03/17   Florencia Reasons, MD  warfarin (COUMADIN) 4 MG tablet  06/24/18   [provider]    Inpatient Medications: Scheduled Meds: . allopurinol  100 mg Oral Daily  . aspirin  324 mg Oral Once  . aspirin  81 mg Oral Daily  . barrier cream  1 application Topical BID  . diazepam  5 mg Oral QHS  . dorzolamide-timolol  1 drop Left Eye BID  .  fluconazole  150 mg Oral Q M,W,F-HD  . heparin  5,000 Units Subcutaneous Q8H  . [START ON 07/07/2018] midodrine  10 mg Oral Q M,W,F  . polyvinyl alcohol  1 drop Both Eyes TID   Continuous Infusions:  PRN Meds: acetaminophen, fentaNYL (SUBLIMAZE) injection, gi cocktail, ondansetron (ZOFRAN) IV, oxyCODONE, promethazine  Allergies:    Allergies  Allergen Reactions  . Erythromycin Swelling  . Codeine Palpitations, Rash and Other (See Comments)    GI Upset  . Erythromycin Base Other (See Comments)    GI Upset  . Penicillin G Rash    Has patient had a PCN reaction causing immediate rash, facial/tongue/throat swelling, SOB or lightheadedness with hypotension: Yes Has patient had a PCN reaction causing severe rash involving mucus membranes or skin necrosis: Unk Has patient had a PCN reaction that required hospitalization: Unk Has patient had a PCN reaction occurring within the last 10 years: Unk If all of the above answers are "NO", then may proceed with Cephalosporin use..  . Penicillins Rash and Other (See Comments)    Has taken Keflex & Rocephin many times without problems Has patient had a PCN reaction causing immediate rash, facial/tongue/throat swelling, SOB or lightheadedness with hypotension: Yes Has patient had a PCN reaction causing severe rash involving mucus membranes or skin necrosis: Unk Has patient had a PCN reaction that required hospitalization: Unk Has patient had a PCN reaction occurring within the last 10 years: Unk If all of the above answers are "NO", then may proceed with Cephalosporin use.   Keturah Shavers [Ranitidine  Hcl] Nausea And Vomiting    Social History:   Social History   Socioeconomic History  . Marital status: Divorced    Spouse name: Not on file  . Number of children: Not on file  . Years of education: Not on file  . Highest education level: Not on file  Occupational History  . Not on file  Social Needs  . Financial resource strain: Not on file  . Food insecurity:    Worry: Not on file    Inability: Not on file  . Transportation needs:    Medical: Not on file    Non-medical: Not on file  Tobacco Use  . Smoking status: Never Smoker  . Smokeless tobacco: Never Used  Substance and Sexual Activity  . Alcohol use: No  . Drug use: No  . Sexual activity: Not on file  Lifestyle  . Physical activity:    Days per week: Not on file    Minutes per session: Not on file  . Stress: Not on file  Relationships  . Social connections:    Talks on phone: Not on file    Gets together: Not on file    Attends religious service: Not on file    Active member of club or organization: Not on file    Attends meetings of clubs or organizations: Not on file    Relationship status: Not on file  . Intimate partner violence:    Fear of current or ex partner: Not on file    Emotionally abused: Not on file    Physically abused: Not on file    Forced sexual activity: Not on file  Other Topics Concern  . Not on file  Social History Narrative   [facility] (10/06/17):   Originally from [place] [facility]. Has always lived in [place]. She currently lives in [place]. No bird exposure. [place] Previously traveled to [place], [place], & [place].  Previously worked in a nursery and also a Pharmacist, community.     Family History:    Family History  Problem Relation Age of Onset  . Diabetes Mother   . Heart disease Mother   . Non-Hodgkin's lymphoma Mother   . Heart attack Father   . Stroke Father   . Parkinson's disease Brother   . Lung disease Brother   . Heart disease Brother   . Multiple myeloma Sister       ROS:  Please see the history of present illness.   All other ROS reviewed and negative.     Physical Exam/Data:   Vitals:   07/06/18 1015 07/06/18 1100 07/06/18 1120 07/06/18 1121  BP: (!) 144/57 (!) 136/50    Pulse: (!) 59 (!) 42 (!) 48 (!) 47  Resp: (!) _0 SpO2: 100% 100% 100% 100%    Intake/Output Summary (Last 24 hours) at 07/06/2018 1208 Last data filed at 07/06/2018 0010 Gross per 24 hour  Intake 500 ml  Output -  Net 500 ml   There were no vitals filed for this visit. There is no height or weight on file to calculate BMI.  General:  Well nourished, well developed, in no acute distress HEENT: normal Neck: no JVD Vascular: No carotid bruits Cardiac:  Regular rhythm, bradycardic rate, no murmur Lungs:  clear to auscultation bilaterally, no wheezing, rhonchi or rales  Abd: soft, nontender, no hepatomegaly  Ext: no edema Musculoskeletal:  No deformities, BUE and BLE strength normal and equal Skin: warm and dry  Neuro:  CNs 2-12 intact, no focal abnormalities noted Psych:  Normal affect   EKG:  The EKG was personally reviewed and demonstrates:  Junctional rhythm vs sinus bradycardia Telemetry:  Telemetry was personally reviewed and demonstrates:  Sinus bradycardia  Relevant CV Studies:  Echo 09/22/17 Study Conclusions - Left ventricle: The cavity size was normal. Wall thickness was   increased in a pattern of mild LVH. Systolic function was normal.   The estimated ejection fraction was in the range of 60% to 65%.   Wall motion was normal; there were no regional wall motion   abnormalities. Doppler parameters are consistent with abnormal   left ventricular relaxation (grade 1 diastolic dysfunction).   Doppler parameters are consistent with high ventricular filling   pressure. - Mitral valve: Severely calcified annulus. - Pulmonary arteries: PA peak pressure: 34 mm Hg (S). - Pericardium, extracardiac: A trivial pericardial effusion was   identified.  There was a left pleural effusion.  Impressions: - Normal LV systolic function; mild diastolic dysfunction; elevated   LV filling pressure; mild LVH; pleural effusion with echodense   areas; suggest chest CT to further assess.  Myoview 12/20/14: 1. No reversible ischemia.  Small mid inferolateral wall infarct. 2. Normal left ventricular wall motion. 3. Left ventricular ejection fraction 68% 4. Low-risk stress test findings   Laboratory Data:  Chemistry Recent Labs  Lab 07/05/18 2129 07/06/18 0428  NA 139 140  K 4.4 4.6  CL 100 103  CO2 28 25  GLUCOSE 163* 154*  BUN 18 23  CREATININE 2.82* 3.21*  CALCIUM 8.2* 7.9*  GFRNONAA 15* 13*  GFRAA 17* 15*  ANIONGAP 11 12    No results for input(s): PROT, ALBUMIN, AST, ALT, ALKPHOS, BILITOT in the last 168 hours. Hematology Recent Labs  Lab 07/05/18 2129 07/06/18 0428  WBC 9.8 10.4  RBC 3.58* 3.29*  HGB 11.7* 10.5*  HCT 37.9 35.5*  MCV 105.9* 107.9*  MCH 32.7 31.9  MCHC 30.9 29.6*  RDW 16.9* 17.0*  PLT 259 242   Cardiac Enzymes Recent Labs  Lab 07/06/18 0117 07/06/18 0428 07/06/18 0804  TROPONINI <0.03 <0.03 <0.03    Recent Labs  Lab 07/05/18 2132 07/06/18 0117  TROPIPOC 0.00 0.01    BNPNo results for input(s): BNP, PROBNP in the last 168 hours.  DDimer No results for input(s): DDIMER in the last 168 hours.  Radiology/Studies:  Dg Chest 2 View  Result Date: 07/05/2018 CLINICAL DATA:  Chest pain starting during dialysis radiating to the back EXAM: CHEST - 2 VIEW COMPARISON:  Chest CT from 01/05/2018 FINDINGS: Atherosclerotic calcification of the aortic arch. Blunted left costophrenic angle with left basilar opacity favoring pleural effusion with passive atelectasis. Right lung appears clear. The pulmonary nodule seen on prior chest CT are not readily apparent on conventional radiography today. Heart size within normal limits. Thoracic spondylosis. IMPRESSION: 1. Heart size within normal limits. 2. Stable left  pleural effusion with passive atelectasis. 3. Thoracic spondylosis. 4.  Atherosclerotic calcification of the aortic arch. 5. Conventional radiography does not exclude the possibility of aortic dissection. Electronically Signed   By: Van Clines M.D.   On: 07/05/2018 20:53    Assessment and Plan:   1. Chest pain during HD - troponin x 3 negative - EKG with sinus bradycardia vs junctional rhythm, artifact - she has essentially ruled out for MI, but continues to have waxing and waning chest pain - I have a lower suspicion for ACS given negative enzymes and EKG - echo 2018 with normal LVEF and grade 1 DD - she is HD dependent, on home O2, is essentially bed-bound - she is not be a good candidate for heart catheterization given her negative enzymes and  Non-ischemic EKG  2. Bradycardia vs junctional rhythm - hold amiodarone for now - she has not received a dose this hospitalization - hold lopressor and other AV nodal blockers - her last occurrence of Afib thought to be 2015 - no anticoagulation given history of GI bleed - recommend observing on telemetry for 24 hr to monitor for resolution of bradycardia and to rule out heart block  3. ESRD on HD - per nephology - will defer HTN and hypotension medication to nephrology  4. Chronic respiratory failure - on home O2, she is no longer short of breath   For questions or updates, please contact [place] Please consult [email or web address] for contact info under Cardiology/STEMI.   Signed, Ledora Bottcher, PA  07/06/2018 12:08 PM   I have examined the patient and reviewed assessment and plan and discussed with patient.  Agree with above as stated.  Unclear what caused her sx after dialysis yesterday.  Negative troponins.  Evidence of conduction disease.  Hold Amio for now.  Hold metoprolol which is listed on her home meds.  If she has AFib with RVR, will have to reassess.  Would like to see if she can get back in NSR from junctional  rhythm.   Normal EF by echo in late 2018.  No plan for ischemia w/u given her overall frailty.     Larae Grooms

## 2018-07-06 NOTE — ED Notes (Signed)
.. ED TO INPATIENT HANDOFF REPORT  Name/Age/Gender [name] James 82 y.o. female  Code Status    Code Status Orders  (From admission, onward)        Start     Ordered   07/06/18 0150  Full code  Continuous     07/06/18 0153    Code Status History    Date Active Date Inactive Code Status Order ID Comments User Context   01/19/2018 1100 01/19/2018 1511 Full Code [id number]  Serafina Mitchell, MD Inpatient   01/20/2017 0136 02/03/2017 2038 Full Code [id number]  Edwin Dada, MD ED   12/21/2014 1314 12/22/2014 1636 DNR [id number]  Sol Blazing, MD Inpatient   12/19/2014 0700 12/21/2014 1314 Full Code [id number]  Ivor Costa, MD ED   11/12/2014 1206 12/06/2014 2035 Full Code [id number]  Azzie Roup, MD Inpatient   11/02/2014 1618 11/12/2014 1206 Full Code [id number]  Ermalinda Memos Inpatient   10/17/2014 1752 11/02/2014 1618 Full Code [id number]  Jonetta Osgood, MD Inpatient      Home/SNF/Other Nursing Home  Chief Complaint CP  Level of Care/Admitting Diagnosis ED Disposition    ED Disposition Condition [place] [100100]  Level of Care: Stepdown [14]  Diagnosis: Chest pain [169678]  Admitting Physician: Norval Morton [[id number]]  Attending Physician: Norval Morton [[id number]]  PT Class (Do Not Modify): Observation [104]  PT Acc Code (Do Not Modify): Observation [10022]       Medical History Past Medical History:  Diagnosis Date  . Anemia, chronic disease   . Anxiety   . Arthritis    knees  . Atrial fibrillation ([place])   . Barrett esophagus   . Benign paroxysmal positional vertigo   . Breast cancer ([place]) 1980s   bil mastectomies, no radiation or chemo  . Cataract   . Chest pain, atypical 12/05/2008   R/Lmv- normal perfusion all regions, noe ECG changes   . CHF (congestive heart failure) ([place]) 05/13/2011   echo - ID>78%; stage 1 diastolic dysfunction; elevated LV filling pressure, MAC  . CKD  (chronic kidney disease), stage III ([place])   . Claudication ([place]) 10/30/2005   doppler - normal evaluation, no evidence of aneurysm, diameter reduction, dissection, compression or vascular abnormality)  . Complication of anesthesia   . Dyslipidemia   . Dysrhythmia    Atrial Fibrillation  . Edema   . ESRD (end stage renal disease) ([place])   . Family history of adverse reaction to anesthesia    son also has nausea   . Fever blister 12/2016  . Gastritis and gastroduodenitis   . GERD (gastroesophageal reflux disease)   . Gout   . Hemodialysis-associated hypotension   . HLD (hyperlipidemia)   . Hypertension   . IBS (irritable bowel syndrome)   . Insomnia   . Morbid obesity ([place])   . PAF (paroxysmal atrial fibrillation) ([place])   . Peripheral neuropathy   . Pneumonia 12/2016  . PONV (postoperative nausea and vomiting)   . Renal cyst   . Renal insufficiency 10/30/2005   doppler - abn resistance consistent w/ parenchymal disease  . Renovascular hypertension   . Type 2 diabetes mellitus with diabetic neuropathy, with long-term current use of insulin ([place])   . Vertigo     Allergies Allergies  Allergen Reactions  . Erythromycin Swelling  . Codeine Palpitations, Rash and Other (See Comments)    GI Upset  . Erythromycin Base Other (See Comments)  GI Upset  . Penicillin G Rash    Has patient had a PCN reaction causing immediate rash, facial/tongue/throat swelling, SOB or lightheadedness with hypotension: Yes Has patient had a PCN reaction causing severe rash involving mucus membranes or skin necrosis: Unk Has patient had a PCN reaction that required hospitalization: Unk Has patient had a PCN reaction occurring within the last 10 years: Unk If all of the above answers are "NO", then may proceed with Cephalosporin use..  . Penicillins Rash and Other (See Comments)    Has taken Keflex & Rocephin many times without problems Has patient had a PCN reaction causing immediate rash,  facial/tongue/throat swelling, SOB or lightheadedness with hypotension: Yes Has patient had a PCN reaction causing severe rash involving mucus membranes or skin necrosis: Unk Has patient had a PCN reaction that required hospitalization: Unk Has patient had a PCN reaction occurring within the last 10 years: Unk If all of the above answers are "NO", then may proceed with Cephalosporin use.   . Zantac [Ranitidine Hcl] Nausea And Vomiting    IV Location/Drains/Wounds Patient Lines/Drains/Airways Status   Active Line/Drains/Airways    Name:   Placement date:   Placement time:   Site:   Days:   Peripheral IV 07/05/18 Left Other (Comment)   07/05/18    2242    Other (Comment)   1   Fistula / Graft Left Forearm Arteriovenous fistula   11/30/14    1100    Forearm   1314   Fistula / Graft Left Upper arm Arteriovenous vein graft   04/12/15    0845    Upper arm   1181   Fistula / Graft Right Forearm Arteriovenous vein graft   06/08/18    1340    Forearm   28   Hemodialysis Catheter Left Internal jugular Temporary   11/12/14    1201    Internal jugular   1332   Hemodialysis Catheter Left Internal jugular Double-lumen;Permanent   11/30/14    1213    Internal jugular   1314   Incision (Closed) 11/30/14 Arm Left   11/30/14    1218     1314   Incision (Closed) 11/30/14 Neck Left   11/30/14    1218     1314   Incision (Closed) 11/30/14 Other (Comment) Left   11/30/14    1218     1314   Incision (Closed) 03/20/15 Arm Left   03/20/15    1407     1204   Incision (Closed) 04/12/15 Arm Left   04/12/15    0906     1181   Incision (Closed) 06/08/18 Arm Right   06/08/18    1339     28   Pressure Injury 01/20/17 Stage II -  Partial thickness loss of dermis presenting as a shallow open ulcer with a red, pink wound bed without slough. large area of non blanchable skin over sacrum and bilateral buttocks   01/20/17    -     532          Labs/Imaging Results for orders placed or performed during the hospital  encounter of 07/05/18 (from the past 48 hour(s))  Type and screen     Status: None   Collection Time: 07/05/18  9:22 PM  Result Value Ref Range   ABO/RH(D) O POS    Antibody Screen NEG    Sample Expiration      07/08/2018 Performed at [facility], [place]  [address] [place], [place] [address]   Basic metabolic panel     Status: Abnormal   Collection Time: 07/05/18  9:29 PM  Result Value Ref Range   Sodium 139 135 - 145 mmol/L   Potassium 4.4 3.5 - 5.1 mmol/L   Chloride 100 98 - 111 mmol/L    Comment: Please note change in reference range.   CO2 28 22 - 32 mmol/L   Glucose, Bld 163 (H) 70 - 99 mg/dL    Comment: Please note change in reference range.   BUN 18 8 - 23 mg/dL    Comment: Please note change in reference range.   Creatinine, Ser 2.82 (H) 0.44 - 1.00 mg/dL   Calcium 8.2 (L) 8.9 - 10.3 mg/dL   GFR calc non Af Amer 15 (L) >60 mL/min   GFR calc Af Amer 17 (L) >60 mL/min    Comment: (NOTE) The eGFR has been calculated using the CKD EPI equation. This calculation has not been validated in all clinical situations. eGFR's persistently <60 mL/min signify possible Chronic Kidney Disease.    Anion gap 11 5 - 15    Comment: Performed at [facility] [address], [place], [place] [address]  CBC     Status: Abnormal   Collection Time: 07/05/18  9:29 PM  Result Value Ref Range   WBC 9.8 4.0 - 10.5 K/uL   RBC 3.58 (L) 3.87 - 5.11 MIL/uL   Hemoglobin 11.7 (L) 12.0 - 15.0 g/dL   HCT 37.9 36.0 - 46.0 %   MCV 105.9 (H) 78.0 - 100.0 fL   MCH 32.7 26.0 - 34.0 pg   MCHC 30.9 30.0 - 36.0 g/dL   RDW 16.9 (H) 11.5 - 15.5 %   Platelets 259 150 - 400 K/uL    Comment: Performed at [facility] [address] [place], [place] [address]  Protime-INR     Status: None   Collection Time: 07/05/18  9:29 PM  Result Value Ref Range   Prothrombin Time 13.4 11.4 - 15.2 seconds   INR 1.03     Comment: Performed at [facility] [address], [place], [place]  [address]  I-stat troponin, ED     Status: None   Collection Time: 07/05/18  9:32 PM  Result Value Ref Range   Troponin i, poc 0.00 0.00 - 0.08 ng/mL   Comment 3            Comment: Due to the release kinetics of cTnI, a negative result within the first hours of the onset of symptoms does not rule out myocardial infarction with certainty. If myocardial infarction is still suspected, repeat the test at appropriate intervals.   TSH     Status: None   Collection Time: 07/06/18  1:15 AM  Result Value Ref Range   TSH 2.099 0.350 - 4.500 uIU/mL    Comment: Performed by a 3rd Generation assay with a functional sensitivity of <=0.01 uIU/mL. Performed at [facility], [place] [address] [place], [place] [address]   I-stat troponin, ED     Status: None   Collection Time: 07/06/18  1:17 AM  Result Value Ref Range   Troponin i, poc 0.01 0.00 - 0.08 ng/mL   Comment 3            Comment: Due to the release kinetics of cTnI, a negative result within the first hours of the onset of symptoms does not rule out myocardial infarction with certainty.  If myocardial infarction is still suspected, repeat the test at appropriate intervals.   Troponin I-serum (0, 3, 6 hours)     Status: None   Collection Time: 07/06/18  1:17 AM  Result Value Ref Range   Troponin I <0.03 <0.03 ng/mL    Comment: Performed at [facility] [address], [place], [place] [address]  Troponin I-serum (0, 3, 6 hours)     Status: None   Collection Time: 07/06/18  4:28 AM  Result Value Ref Range   Troponin I <0.03 <0.03 ng/mL    Comment: Performed at [facility] [address], [place], [place] [address]  CBC with Differential/Platelet     Status: Abnormal   Collection Time: 07/06/18  4:28 AM  Result Value Ref Range   WBC 10.4 4.0 - 10.5 K/uL   RBC 3.29 (L) 3.87 - 5.11 MIL/uL   Hemoglobin 10.5 (L) 12.0 - 15.0 g/dL   HCT 35.5 (L) 36.0 - 46.0 %   MCV 107.9 (H) 78.0 - 100.0 fL   MCH 31.9 26.0 - 34.0 pg    MCHC 29.6 (L) 30.0 - 36.0 g/dL   RDW 17.0 (H) 11.5 - 15.5 %   Platelets 242 150 - 400 K/uL   Neutrophils Relative % 48 %   Neutro Abs 5.0 1.7 - 7.7 K/uL   Lymphocytes Relative 39 %   Lymphs Abs 4.0 0.7 - 4.0 K/uL   Monocytes Relative 10 %   Monocytes Absolute 1.1 (H) 0.1 - 1.0 K/uL   Eosinophils Relative 2 %   Eosinophils Absolute 0.2 0.0 - 0.7 K/uL   Basophils Relative 1 %   Basophils Absolute 0.1 0.0 - 0.1 K/uL   Immature Granulocytes 0 %   Abs Immature Granulocytes 0.0 0.0 - 0.1 K/uL    Comment: Performed at [facility], [place] [address] [place], [place] [address]  Basic metabolic panel     Status: Abnormal   Collection Time: 07/06/18  4:28 AM  Result Value Ref Range   Sodium 140 135 - 145 mmol/L   Potassium 4.6 3.5 - 5.1 mmol/L   Chloride 103 98 - 111 mmol/L    Comment: Please note change in reference range.   CO2 25 22 - 32 mmol/L   Glucose, Bld 154 (H) 70 - 99 mg/dL    Comment: Please note change in reference range.   BUN 23 8 - 23 mg/dL    Comment: Please note change in reference range.   Creatinine, Ser 3.21 (H) 0.44 - 1.00 mg/dL   Calcium 7.9 (L) 8.9 - 10.3 mg/dL   GFR calc non Af Amer 13 (L) >60 mL/min   GFR calc Af Amer 15 (L) >60 mL/min    Comment: (NOTE) The eGFR has been calculated using the CKD EPI equation. This calculation has not been validated in all clinical situations. eGFR's persistently <60 mL/min signify possible Chronic Kidney Disease.    Anion gap 12 5 - 15    Comment: Performed at [facility] [address] [place], [place] [address]  Troponin I-serum (0, 3, 6 hours)     Status: None   Collection Time: 07/06/18  8:04 AM  Result Value Ref Range   Troponin I <0.03 <0.03 ng/mL    Comment: Performed at [facility] [address], [place], [place] [address]   Dg Chest 2 View  Result Date: 07/05/2018 CLINICAL DATA:  Chest pain starting during dialysis radiating to the back  EXAM: CHEST - 2 VIEW COMPARISON:  Chest CT from  01/05/2018 FINDINGS: Atherosclerotic calcification of the aortic arch. Blunted left costophrenic angle with left basilar opacity favoring pleural effusion with passive atelectasis. Right lung appears clear. The pulmonary nodule seen on prior chest CT are not readily apparent on conventional radiography today. Heart size within normal limits. Thoracic spondylosis. IMPRESSION: 1. Heart size within normal limits. 2. Stable left pleural effusion with passive atelectasis. 3. Thoracic spondylosis. 4.  Atherosclerotic calcification of the aortic arch. 5. Conventional radiography does not exclude the possibility of aortic dissection. Electronically Signed   By: Van Clines M.D.   On: 07/05/2018 20:53    Pending Labs Unresulted Labs (From admission, onward)   None      Vitals/Pain Today's Vitals   07/06/18 1121 07/06/18 1140 07/06/18 1235 07/06/18 1309  BP:    (!) 142/50  Pulse: (!) 47   (!) 45  Resp: 15   20  Temp:    97.9 F (36.6 C)  SpO2: 100%   100%  PainSc:  0-No pain 0-No pain 0-No pain    Isolation Precautions No active isolations  Medications Medications  aspirin chewable tablet 324 mg (324 mg Oral Not Given 07/06/18 0327)  diazepam (VALIUM) tablet 5 mg (5 mg Oral Given 07/06/18 0328)  allopurinol (ZYLOPRIM) tablet 100 mg (100 mg Oral Given 07/06/18 1114)  dorzolamide-timolol (COSOPT) 22.3-6.8 MG/ML ophthalmic solution 1 drop (1 drop Left Eye Given 07/06/18 1115)  oxyCODONE (Oxy IR/ROXICODONE) immediate release tablet 5 mg (has no administration in time range)  promethazine (PHENERGAN) tablet 25 mg (has no administration in time range)  midodrine (PROAMATINE) tablet 10 mg (has no administration in time range)  polyvinyl alcohol (LIQUIFILM TEARS) 1.4 % ophthalmic solution 1 drop (has no administration in time range)  fluconazole (DIFLUCAN) tablet 150 mg (0 mg Oral Hold 07/06/18 0604)  aspirin chewable tablet 81 mg (81 mg Oral Given 07/06/18 1113)  acetaminophen (TYLENOL) tablet  650 mg (has no administration in time range)  ondansetron (ZOFRAN) injection 4 mg (has no administration in time range)  heparin injection 5,000 Units (has no administration in time range)  fentaNYL (SUBLIMAZE) injection 12.5 mcg (has no administration in time range)  gi cocktail (Maalox,Lidocaine,Donnatal) (has no administration in time range)  barrier cream (non-specified) 1 application (1 application Topical Given 07/06/18 0329)  sodium chloride 0.9 % bolus 500 mL (0 mLs Intravenous Stopped 07/06/18 0010)  fentaNYL (SUBLIMAZE) injection 12.5 mcg (12.5 mcg Intravenous Given 07/05/18 2338)    Mobility bedridden

## 2018-07-06 NOTE — H&P (Signed)
History and Physical    [name]:[id number] DOB: 05/16/1936 DOA: 07/05/2018  Referring MD/NP/PA:  Blanchie Dessert, MD PCP: [name], DO  Patient coming from: [facility] place via EMS  Chief Complaint: Chest pressure  I have personally briefly reviewed patient's old medical records in [place]   HPI: [name] James is a 82 y.o. female with medical history significant of A. fib not currently on anticoagulation due to bleeding risk, ESRD on HD(M/W/F), HTN, DM type II, and oxygen dependent on 2 L; who presents with complaints of chest pressure while receiving dialysis. At baseline patient is bedbound.  Pain is located around her chest and rib cage and radiated into her back.  She describes it as though someone were sitting on her chest and has been constant.  Patient has been having issues with hypotension for which she has been taking Midrin prior to dialysis sessions.  Her pressures dropped yesterday during dialysis and she reported feeling lightheaded and nauseated.  She has not missed any of her dialysis sessions.  Associated symptoms include shortness of breath, chronic abdominal pain, and nausea.  Patient denies having any significant fever, cough, loss of consciousness, or diarrhea. Patient has not taking Coumadin or metoprolol (since 7/7) per review of her medication list.   ED Course: Upon admission into the emergency department patient was seen to pulse 44-81, respirations 13-23, blood pressure 91/25-102/25, and O2 saturation maintained on home O2.  Labs revealed WBC 9.8, hemoglobin 11.7, BUN 18, creatinine 2.82, glucose 163, and troponin 0.  EKG showed no significant ischemic changes.  Patient was given 324 mg of aspirin and 12.5 mcg of fentanyl.  Review of Systems  Constitutional: Negative for chills and fever.  HENT: Negative for congestion and nosebleeds.   Eyes: Negative for photophobia and pain.  Respiratory: Positive for shortness of breath. Negative for  cough.   Cardiovascular: Positive for chest pain. Negative for leg swelling.  Gastrointestinal: Positive for abdominal pain and nausea. Negative for blood in stool.  Genitourinary: Negative for dysuria.  Musculoskeletal: Negative for falls.  Skin: Negative for rash.  Neurological: Negative for focal weakness and loss of consciousness.  Psychiatric/Behavioral: Negative for memory loss and suicidal ideas.    Past Medical History:  Diagnosis Date  . Anemia, chronic disease   . Anxiety   . Arthritis    knees  . Atrial fibrillation ([place])   . Barrett esophagus   . Benign paroxysmal positional vertigo   . Breast cancer ([place]) 1980s   bil mastectomies, no radiation or chemo  . Cataract   . Chest pain, atypical 12/05/2008   R/Lmv- normal perfusion all regions, noe ECG changes   . CHF (congestive heart failure) ([place]) 05/13/2011   echo - SE>83%; stage 1 diastolic dysfunction; elevated LV filling pressure, MAC  . CKD (chronic kidney disease), stage III ([place])   . Claudication ([place]) 10/30/2005   doppler - normal evaluation, no evidence of aneurysm, diameter reduction, dissection, compression or vascular abnormality)  . Complication of anesthesia   . Dyslipidemia   . Dysrhythmia    Atrial Fibrillation  . Edema   . ESRD (end stage renal disease) ([place])   . Family history of adverse reaction to anesthesia    son also has nausea   . Fever blister 12/2016  . Gastritis and gastroduodenitis   . GERD (gastroesophageal reflux disease)   . Gout   . Hemodialysis-associated hypotension   . HLD (hyperlipidemia)   . Hypertension   . IBS (irritable  bowel syndrome)   . Insomnia   . Morbid obesity ([place])   . PAF (paroxysmal atrial fibrillation) ([place])   . Peripheral neuropathy   . Pneumonia 12/2016  . PONV (postoperative nausea and vomiting)   . Renal cyst   . Renal insufficiency 10/30/2005   doppler - abn resistance consistent w/ parenchymal disease  . Renovascular hypertension   . Type 2 diabetes  mellitus with diabetic neuropathy, with long-term current use of insulin ([place])   . Vertigo     Past Surgical History:  Procedure Laterality Date  . A/V FISTULAGRAM Left 03/19/2018   Procedure: A/V FISTULAGRAM;  Surgeon: Angelia Mould, MD;  Location: [place] [facility];  Service: Cardiovascular;  Laterality: Left;  . A/V SHUNTOGRAM Left 01/19/2018   Procedure: A/V SHUNTOGRAM;  Surgeon: Serafina Mitchell, MD;  Location: [place] [facility];  Service: Cardiovascular;  Laterality: Left;  . ANGIOPLASTY Left 02/12/2015   Procedure: ANGIOPLASTY;  Surgeon: Angelia Mould, MD;  Location: [facility] CATH LAB;  Service: Cardiovascular;  Laterality: Left;  AVF  . AV FISTULA PLACEMENT Left 11/30/2014   Procedure: ARTERIOVENOUS (AV) FISTULA CREATION LEFT ARM;  Surgeon: Angelia Mould, MD;  Location: [place];  Service: Vascular;  Laterality: Left;  . AV FISTULA PLACEMENT Left 03/20/2015   Procedure:  Inserton of Left Upper Arm Gortex Graft;  Surgeon: Angelia Mould, MD;  Location: [place];  Service: Vascular;  Laterality: Left;  . AV FISTULA PLACEMENT Left 04/12/2015   Procedure: INSERTION OF LEFT ARM  ARTERIOVENOUS GORE-TEX GRAFT ;  Surgeon: Angelia Mould, MD;  Location: [place];  Service: Vascular;  Laterality: Left;  . AV FISTULA PLACEMENT Right 06/08/2018   Procedure: INSERTION OF ARTERIOVENOUS (AV) GORE-TEX GRAFT ARM USING 4-7MM X 45CM GORETEX GRAFT;  Surgeon: Angelia Mould, MD;  Location: [place];  Service: Vascular;  Laterality: Right;  . BACK SURGERY  ~1980  . CATARACT EXTRACTION Right   . CHOLECYSTECTOMY    . ESOPHAGOGASTRODUODENOSCOPY N/A 10/24/2014   Procedure: ESOPHAGOGASTRODUODENOSCOPY (EGD);  Surgeon: Jerene Bears, MD;  Location: [facility] ENDOSCOPY;  Service: Endoscopy;  Laterality: N/A;  . EXCHANGE OF A DIALYSIS CATHETER Left 11/30/2014   Procedure: EXCHANGE OF A DIALYSIS CATHETER, LEFT INTERNAL JUGULAR;  Surgeon: Angelia Mould, MD;  Location: [place];  Service:  Vascular;  Laterality: Left;  . EYE SURGERY Left    transplant  . FISTULOGRAM N/A 02/12/2015   Procedure: FISTULOGRAM;  Surgeon: Angelia Mould, MD;  Location: [facility] CATH LAB;  Service: Cardiovascular;  Laterality: N/A;  . HEMORRHOID SURGERY    . LIGATION OF ARTERIOVENOUS  FISTULA Left 03/20/2015   Procedure: LIGATION OF ARTERIOVENOUS  FISTULA;  Surgeon: Angelia Mould, MD;  Location: [place];  Service: Vascular;  Laterality: Left;  Marland Kitchen MASTECTOMY Bilateral ~1982   bil breast reconstruction with implants  . ORIF TIBIA & FIBULA FRACTURES Left 2007   also had left non displaced malleolar fracture.   Marland Kitchen PERIPHERAL VASCULAR BALLOON ANGIOPLASTY Left 01/19/2018   Procedure: PERIPHERAL VASCULAR BALLOON ANGIOPLASTY;  Surgeon: Serafina Mitchell, MD;  Location: [place] [facility];  Service: Cardiovascular;  Laterality: Left;  fistula  . PERIPHERAL VASCULAR BALLOON ANGIOPLASTY Left 03/19/2018   Procedure: PERIPHERAL VASCULAR BALLOON ANGIOPLASTY;  Surgeon: Angelia Mould, MD;  Location: [place] CV [facility];  Service: Cardiovascular;  Laterality: Left;  UPPER ARM FISTULA  . REMOVAL OF GRAFT Left 04/12/2015   Procedure: REMOVAL OF LEFT ARM ARTERIOVENOUS GORE-TEX GRAFT;  Surgeon: Angelia Mould,  MD;  Location: [place];  Service: Vascular;  Laterality: Left;     reports that she has never smoked. She has never used smokeless tobacco. She reports that she does not drink alcohol or use drugs.  Allergies  Allergen Reactions  . Erythromycin Swelling  . Codeine Palpitations, Rash and Other (See Comments)    GI Upset  . Erythromycin Base Other (See Comments)    GI Upset  . Penicillin G Rash    Has patient had a PCN reaction causing immediate rash, facial/tongue/throat swelling, SOB or lightheadedness with hypotension: Yes Has patient had a PCN reaction causing severe rash involving mucus membranes or skin necrosis: Unk Has patient had a PCN reaction that required hospitalization: Unk Has  patient had a PCN reaction occurring within the last 10 years: Unk If all of the above answers are "NO", then may proceed with Cephalosporin use..  . Penicillins Rash and Other (See Comments)    Has taken Keflex & Rocephin many times without problems Has patient had a PCN reaction causing immediate rash, facial/tongue/throat swelling, SOB or lightheadedness with hypotension: Yes Has patient had a PCN reaction causing severe rash involving mucus membranes or skin necrosis: Unk Has patient had a PCN reaction that required hospitalization: Unk Has patient had a PCN reaction occurring within the last 10 years: Unk If all of the above answers are "NO", then may proceed with Cephalosporin use.   Keturah Shavers [Ranitidine Hcl] Nausea And Vomiting    Family History  Problem Relation Age of Onset  . Diabetes Mother   . Heart disease Mother   . Non-Hodgkin's lymphoma Mother   . Heart attack Father   . Stroke Father   . Parkinson's disease Brother   . Lung disease Brother   . Heart disease Brother   . Multiple myeloma Sister     Prior to Admission medications   Medication Sig Start Date End Date Taking? Authorizing Provider  acetaminophen (TYLENOL) 325 MG tablet Take 650 mg by mouth every 6 (six) hours as needed for fever ("for vomiting/nausea," per [facility]).    Yes [provider]  allopurinol (ZYLOPRIM) 100 MG tablet Take 100 mg by mouth daily.   Yes [provider]  amiodarone (PACERONE) 200 MG tablet Take 200 mg by mouth daily.  06/08/15  Yes [name], MD  aspirin 81 MG chewable tablet Chew 81 mg by mouth daily.   Yes [provider]  B Complex-C-Folic Acid (NEPHRO VITAMINS) 0.8 MG TABS Take 1 tablet by mouth daily.   Yes [provider]  diazepam (VALIUM) 5 MG tablet Take one tablet by mouth every night at bedtime for rest Patient taking differently: Take 5 mg by mouth at bedtime.  02/04/17  Yes Carter, Monica, DO  DIMETHICONE, TOPICAL, 5 % CREA Apply 1  application topically See admin instructions. Place a nickel-sized amount onto buttock(s) area 2 times a day for barrier protection   Yes [provider]  dorzolamide-timolol (COSOPT) 22.3-6.8 MG/ML ophthalmic solution Place 1 drop into the left eye 2 (two) times daily. Wait 2-5 minutes when giving two eye meds   Yes [provider]  fluconazole (DIFLUCAN) 150 MG tablet Take 150 mg by mouth See admin instructions. Take 150 mg by mouth once a day on dialysis days only for 7 days for vaginal candidiasis 06/24/18 07/09/18 Yes [provider]  loperamide (IMODIUM A-D) 2 MG tablet Take 2 mg by mouth 3 (three) times daily as needed (for diarrhea).    Yes  [provider]  loratadine (CLARITIN) 10 MG tablet Take 10 mg by mouth daily.   Yes [provider]  Melatonin 5 MG TABS Take 5 mg by mouth at bedtime.    Yes [provider]  midodrine (PROAMATINE) 10 MG tablet Take 10 mg by mouth See admin instructions. Take 10 mg by mouth WITH MORNING MEDS AT FACILITY on Mon/Wed/Fri- dialysis days   Yes [provider]  midodrine (PROAMATINE) 5 MG tablet Take 5 mg by mouth See admin instructions. Take 2 tablets (10 mg) TO DIALYSIS on Mon/Wed/Fri to be dispensed by staff as directed (Dx code: I48.0: Paroxysmal fibrillation)   Yes [provider]  oxyCODONE (ROXICODONE) 5 MG immediate release tablet Take 1 tablet (5 mg total) by mouth every 4 (four) hours as needed for severe pain. Patient taking differently: Take 5 mg by mouth every 4 (four) hours as needed (for pain).  06/08/18  Yes Angelia Mould, MD  OXYGEN Inhale 2 L into the lungs continuous.    Yes [provider]  Polyethyl Glycol-Propyl Glycol (SYSTANE ULTRA) 0.4-0.3 % SOLN Place 1 drop into both eyes 3 (three) times daily.   Yes [provider]  promethazine (PHENERGAN) 25 MG tablet Take 25 mg by mouth every 8 (eight) hours as needed for nausea or vomiting.   Yes [provider]  fluticasone (FLONASE ALLERGY RELIEF) 50 MCG/ACT nasal spray Place 2 sprays into both nostrils daily.    [provider]  Hydrocortisone (GERHARDT'S BUTT CREAM) CREA Apply 1 application topically 3 (three) times daily.    [provider]  metoprolol tartrate (LOPRESSOR) 25 MG tablet Take 0.5 tablets (12.5 mg total) by mouth 2 (two) times daily. Hold if sbp less than 100 or heart rate less than 50, hold on dialysis day. Patient not taking: Reported on 07/05/2018 02/03/17   Florencia Reasons, MD  warfarin (COUMADIN) 4 MG tablet  06/24/18   [provider]    Physical Exam:  Constitutional: NAD, calm, comfortable Vitals:   07/05/18 2245 07/05/18 2300 07/05/18 2330 07/05/18 2345  BP: (!) 102/25  97/77 101/67  Pulse: (!) 45 (!) 44 (!) 47 (!) 48  Resp: 19 13 (!) 21 17  SpO2: 100% 100% 100% 100%   Eyes: PERRL, lids and conjunctivae normal ENMT: Mucous membranes are moist. Posterior pharynx clear of any exudate or lesions.Normal dentition.  Neck: normal, supple, no masses, no thyromegaly Respiratory: clear to auscultation bilaterally, no wheezing, no crackles. Normal respiratory effort. No accessory muscle use.  Cardiovascular: Bradycardic, no murmurs / rubs / gallops. No extremity edema. 2+ pedal pulses. No carotid bruits.  Patient is present in the bilateral upper extremity Abdomen: no tenderness, no masses palpated. No hepatosplenomegaly. Bowel sounds positive.  Musculoskeletal: no clubbing / cyanosis. No joint deformity upper and lower extremities. Good ROM, no contractures. Normal muscle tone.  Skin: no rashes, lesions, ulcers. No induration Neurologic: CN 2-12 grossly intact. Sensation intact, DTR normal. Strength 5/5 in all 4.  Psychiatric: Normal judgment and insight. Alert and oriented x 3. Normal mood.     Labs on Admission: I have personally reviewed following labs and imaging studies  CBC: Recent Labs  Lab 07/05/18 2129  WBC 9.8  HGB 11.7*  HCT  37.9  MCV 105.9*  PLT 161   Basic Metabolic Panel: Recent Labs  Lab 07/05/18 2129  NA 139  K 4.4  CL 100  CO2 28  GLUCOSE 163*  BUN 18  CREATININE 2.82*  CALCIUM 8.2*  GFR: Estimated Creatinine Clearance: 15.5 mL/min (A) (by C-G formula based on SCr of 2.82 mg/dL (H)). Liver Function Tests: No results for input(s): AST, ALT, ALKPHOS, BILITOT, PROT, ALBUMIN in the last 168 hours. No results for input(s): LIPASE, AMYLASE in the last 168 hours. No results for input(s): AMMONIA in the last 168 hours. Coagulation Profile: Recent Labs  Lab 07/05/18 2129  INR 1.03   Cardiac Enzymes: No results for input(s): CKTOTAL, CKMB, CKMBINDEX, TROPONINI in the last 168 hours. BNP (last 3 results) No results for input(s): PROBNP in the last 8760 hours. HbA1C: No results for input(s): HGBA1C in the last 72 hours. CBG: No results for input(s): GLUCAP in the last 168 hours. Lipid Profile: No results for input(s): CHOL, HDL, LDLCALC, TRIG, CHOLHDL, LDLDIRECT in the last 72 hours. Thyroid Function Tests: No results for input(s): TSH, T4TOTAL, FREET4, T3FREE, THYROIDAB in the last 72 hours. Anemia Panel: No results for input(s): VITAMINB12, FOLATE, FERRITIN, TIBC, IRON, RETICCTPCT in the last 72 hours. Urine analysis:    Component Value Date/Time   COLORURINE YELLOW 10/26/2014 2043   APPEARANCEUR CLOUDY (A) 10/26/2014 2043   LABSPEC 1.011 10/26/2014 2043   PHURINE 5.0 10/26/2014 2043   GLUCOSEU 100 (A) 10/26/2014 2043   HGBUR SMALL (A) 10/26/2014 2043   BILIRUBINUR NEGATIVE 10/26/2014 2043   BILIRUBINUR neg 04/02/2014 0848   KETONESUR NEGATIVE 10/26/2014 2043   PROTEINUR 100 (A) 10/26/2014 2043   UROBILINOGEN 0.2 10/26/2014 2043   NITRITE POSITIVE (A) 10/26/2014 2043   LEUKOCYTESUR MODERATE (A) 10/26/2014 2043   Sepsis Labs: No results found for this or any previous visit (from the past 240 hour(s)).   Radiological Exams on Admission: Dg Chest 2 View  Result Date:  07/05/2018 CLINICAL DATA:  Chest pain starting during dialysis radiating to the back EXAM: CHEST - 2 VIEW COMPARISON:  Chest CT from 01/05/2018 FINDINGS: Atherosclerotic calcification of the aortic arch. Blunted left costophrenic angle with left basilar opacity favoring pleural effusion with passive atelectasis. Right lung appears clear. The pulmonary nodule seen on prior chest CT are not readily apparent on conventional radiography today. Heart size within normal limits. Thoracic spondylosis. IMPRESSION: 1. Heart size within normal limits. 2. Stable left pleural effusion with passive atelectasis. 3. Thoracic spondylosis. 4.  Atherosclerotic calcification of the aortic arch. 5. Conventional radiography does not exclude the possibility of aortic dissection. Electronically Signed   By: Van Clines M.D.   On: 07/05/2018 20:53    EKG: Independently reviewed.  Junctional rhythm 46 bpm  Assessment/Plan Chest pain: Acute. Patient present with complaints of chest pressure with radiation to her back.  Initial troponin negative.  EKG showing junctional rhythm at 46 bpm. - Admit to a stepdown bed - N.p.o. - Trend cardiac troponins - Check echocardiogram - Message sent for cardiology to evaluate in a.m.  Bradycardia: On admission patient's heart rates were noted to be persistently in the 40s.  Per review of records from nursing facility it appears that metoprolol has been on hold since 7/7. - Continue holding metoprolol  Hypotension: Acute.  Blood pressure noted be as low as 91/25 on admission.  Pressures were slightly improved with 500 mL bolus of IV fluids. - Gentle IV fluid as needed overnight to maintain blood pressure  Paroxysmal atrial fibrillation: Patient appears not to be on any anticoagulation due to history of bleeding. - Discussed with cardiology in a.m. regarding amiodarone  ESRD on HD: Patient has been receiving her normal hemodialysis sessions on M/W/F.  Patient just recently had  right upper extremity fistula placed that is still maturing by Dr. Scot Dock on 6/18. - Consult nephrology if needed   Diabetes mellitus type 2: Patient appears well controlled not currently on any medication. Last hemoglobin A1c noted to be 5.7 in 12/2016. -Continue to  DVT prophylaxis: Heparin Code Status: Full Family Communication: No family present at bedside  Disposition Plan: Likely discharge back to nursing facility once medically stable Consults called: None Admission status: Observation  Norval Morton MD [facility] Pager [phone]   If 7PM-7AM, please contact night-coverage [email or web address] Password [facility]  07/06/2018, 12:54 AM

## 2018-07-06 NOTE — ED Notes (Signed)
Report called to Ebony Hail, RN on [facility] - ready to accept pt

## 2018-07-06 NOTE — Progress Notes (Addendum)
Patient is admitted earlier this morning for bradycardia.  Cardiology consulted for medication adjustment.  Nephrology notified for needing dialysis Monday Wednesday Friday. Currently patient is feeling better, may be able to discharge back to SNF tomorrow pending cardiology clearance. Appreciate nephrology input to coordinate dialysis.

## 2018-07-06 NOTE — ED Notes (Signed)
Report taken from previously assigned RN - care assumed at this time; resting quietly on stretcher - no complaints at this time; awaiting inpt bed assignment - pt aware of same

## 2018-07-07 DIAGNOSIS — Z7401 Bed confinement status: Secondary | ICD-10-CM | POA: Diagnosis not present

## 2018-07-07 DIAGNOSIS — Z794 Long term (current) use of insulin: Secondary | ICD-10-CM | POA: Diagnosis not present

## 2018-07-07 DIAGNOSIS — E1122 Type 2 diabetes mellitus with diabetic chronic kidney disease: Secondary | ICD-10-CM | POA: Diagnosis not present

## 2018-07-07 DIAGNOSIS — J9611 Chronic respiratory failure with hypoxia: Secondary | ICD-10-CM | POA: Diagnosis not present

## 2018-07-07 DIAGNOSIS — M2550 Pain in unspecified joint: Secondary | ICD-10-CM | POA: Diagnosis not present

## 2018-07-07 DIAGNOSIS — Z992 Dependence on renal dialysis: Secondary | ICD-10-CM | POA: Diagnosis not present

## 2018-07-07 DIAGNOSIS — Z885 Allergy status to narcotic agent status: Secondary | ICD-10-CM | POA: Diagnosis not present

## 2018-07-07 DIAGNOSIS — Z9981 Dependence on supplemental oxygen: Secondary | ICD-10-CM | POA: Diagnosis not present

## 2018-07-07 DIAGNOSIS — R079 Chest pain, unspecified: Secondary | ICD-10-CM | POA: Diagnosis not present

## 2018-07-07 DIAGNOSIS — I953 Hypotension of hemodialysis: Secondary | ICD-10-CM | POA: Diagnosis not present

## 2018-07-07 DIAGNOSIS — R293 Abnormal posture: Secondary | ICD-10-CM | POA: Diagnosis not present

## 2018-07-07 DIAGNOSIS — R001 Bradycardia, unspecified: Secondary | ICD-10-CM | POA: Diagnosis not present

## 2018-07-07 DIAGNOSIS — I5032 Chronic diastolic (congestive) heart failure: Secondary | ICD-10-CM | POA: Diagnosis not present

## 2018-07-07 DIAGNOSIS — I120 Hypertensive chronic kidney disease with stage 5 chronic kidney disease or end stage renal disease: Secondary | ICD-10-CM | POA: Diagnosis not present

## 2018-07-07 DIAGNOSIS — Z7901 Long term (current) use of anticoagulants: Secondary | ICD-10-CM | POA: Diagnosis not present

## 2018-07-07 DIAGNOSIS — I509 Heart failure, unspecified: Secondary | ICD-10-CM | POA: Diagnosis present

## 2018-07-07 DIAGNOSIS — Z7982 Long term (current) use of aspirin: Secondary | ICD-10-CM | POA: Diagnosis not present

## 2018-07-07 DIAGNOSIS — E1140 Type 2 diabetes mellitus with diabetic neuropathy, unspecified: Secondary | ICD-10-CM | POA: Diagnosis not present

## 2018-07-07 DIAGNOSIS — Z79899 Other long term (current) drug therapy: Secondary | ICD-10-CM | POA: Diagnosis not present

## 2018-07-07 DIAGNOSIS — E1129 Type 2 diabetes mellitus with other diabetic kidney complication: Secondary | ICD-10-CM | POA: Diagnosis not present

## 2018-07-07 DIAGNOSIS — Z881 Allergy status to other antibiotic agents status: Secondary | ICD-10-CM | POA: Diagnosis not present

## 2018-07-07 DIAGNOSIS — M6281 Muscle weakness (generalized): Secondary | ICD-10-CM | POA: Diagnosis not present

## 2018-07-07 DIAGNOSIS — Z880 Allergy status to penicillin: Secondary | ICD-10-CM | POA: Diagnosis not present

## 2018-07-07 DIAGNOSIS — N2581 Secondary hyperparathyroidism of renal origin: Secondary | ICD-10-CM | POA: Diagnosis not present

## 2018-07-07 DIAGNOSIS — I132 Hypertensive heart and chronic kidney disease with heart failure and with stage 5 chronic kidney disease, or end stage renal disease: Secondary | ICD-10-CM | POA: Diagnosis not present

## 2018-07-07 DIAGNOSIS — N186 End stage renal disease: Secondary | ICD-10-CM | POA: Diagnosis not present

## 2018-07-07 DIAGNOSIS — D638 Anemia in other chronic diseases classified elsewhere: Secondary | ICD-10-CM | POA: Diagnosis present

## 2018-07-07 DIAGNOSIS — D631 Anemia in chronic kidney disease: Secondary | ICD-10-CM | POA: Diagnosis not present

## 2018-07-07 DIAGNOSIS — I480 Paroxysmal atrial fibrillation: Secondary | ICD-10-CM | POA: Diagnosis not present

## 2018-07-07 DIAGNOSIS — R498 Other voice and resonance disorders: Secondary | ICD-10-CM | POA: Diagnosis not present

## 2018-07-07 DIAGNOSIS — I272 Pulmonary hypertension, unspecified: Secondary | ICD-10-CM | POA: Diagnosis present

## 2018-07-07 DIAGNOSIS — R072 Precordial pain: Secondary | ICD-10-CM | POA: Diagnosis not present

## 2018-07-07 DIAGNOSIS — E1151 Type 2 diabetes mellitus with diabetic peripheral angiopathy without gangrene: Secondary | ICD-10-CM | POA: Diagnosis not present

## 2018-07-07 DIAGNOSIS — G8929 Other chronic pain: Secondary | ICD-10-CM | POA: Diagnosis present

## 2018-07-07 DIAGNOSIS — F419 Anxiety disorder, unspecified: Secondary | ICD-10-CM | POA: Diagnosis not present

## 2018-07-07 LAB — RENAL FUNCTION PANEL
ANION GAP: 11 (ref 5–15)
Albumin: 2.6 g/dL — ABNORMAL LOW (ref 3.5–5.0)
BUN: 38 mg/dL — ABNORMAL HIGH (ref 8–23)
CHLORIDE: 100 mmol/L (ref 98–111)
CO2: 25 mmol/L (ref 22–32)
CREATININE: 4.36 mg/dL — AB (ref 0.44–1.00)
Calcium: 7.8 mg/dL — ABNORMAL LOW (ref 8.9–10.3)
GFR calc non Af Amer: 9 mL/min — ABNORMAL LOW (ref >60)
GFR, EST AFRICAN AMERICAN: 10 mL/min — AB (ref >60)
Glucose, Bld: 149 mg/dL — ABNORMAL HIGH (ref 70–99)
POTASSIUM: 4.2 mmol/L (ref 3.5–5.1)
Phosphorus: 4.5 mg/dL (ref 2.5–4.6)
SODIUM: 136 mmol/L (ref 135–145)

## 2018-07-07 LAB — CBC
HEMATOCRIT: 31.3 % — AB (ref 36.0–46.0)
HEMOGLOBIN: 9.6 g/dL — AB (ref 12.0–15.0)
MCH: 32.9 pg (ref 26.0–34.0)
MCHC: 30.7 g/dL (ref 30.0–36.0)
MCV: 107.2 fL — AB (ref 78.0–100.0)
PLATELETS: 179 10*3/uL (ref 150–400)
RBC: 2.92 MIL/uL — AB (ref 3.87–5.11)
RDW: 17.2 % — ABNORMAL HIGH (ref 11.5–15.5)
WBC: 7.4 10*3/uL (ref 4.0–10.5)

## 2018-07-07 LAB — GLUCOSE, CAPILLARY
GLUCOSE-CAPILLARY: 163 mg/dL — AB (ref 70–99)
Glucose-Capillary: 137 mg/dL — ABNORMAL HIGH (ref 70–99)
Glucose-Capillary: 114 mg/dL — ABNORMAL HIGH (ref 70–99)

## 2018-07-07 MED ORDER — INSULIN ASPART 100 UNIT/ML ~~LOC~~ SOLN: 0.0000 [IU] | Freq: Three times a day (TID) | SUBCUTANEOUS | Status: DC

## 2018-07-07 MED ORDER — CHLORHEXIDINE GLUCONATE CLOTH 2 % EX PADS
6.0000 | MEDICATED_PAD | Freq: Every day | CUTANEOUS | Status: DC
  Administered 2018-07-07 – 2018-07-08 (×2): 6 via TOPICAL

## 2018-07-07 MED ORDER — LOPERAMIDE HCL 2 MG PO CAPS
2.0000 mg | ORAL_CAPSULE | ORAL | Status: DC | PRN
Start: 2018-07-07 — End: 2018-07-08
  Administered 2018-07-07: 2 mg via ORAL
  Filled 2018-07-07: qty 1

## 2018-07-07 MED ORDER — HEPARIN SODIUM (PORCINE) 1000 UNIT/ML DIALYSIS
3300.0000 [IU] | INTRAMUSCULAR | Status: DC | PRN
  Administered 2018-07-07: 3300 [IU] via INTRAVENOUS_CENTRAL
  Filled 2018-07-07: qty 3.3

## 2018-07-07 MED ORDER — LIDOCAINE-PRILOCAINE 2.5-2.5 % EX CREA: 1.0000 "application " | TOPICAL_CREAM | CUTANEOUS | Status: DC | PRN

## 2018-07-07 MED ORDER — SODIUM CHLORIDE 0.9 % IV SOLN: 100.0000 mL | INTRAVENOUS | Status: DC | PRN

## 2018-07-07 MED ORDER — CALCIUM ACETATE (PHOS BINDER) 667 MG PO CAPS
1334.0000 mg | ORAL_CAPSULE | Freq: Three times a day (TID) | ORAL | Status: DC
  Administered 2018-07-08: 1334 mg via ORAL
  Filled 2018-07-07: qty 2

## 2018-07-07 MED ORDER — LIDOCAINE HCL (PF) 1 % IJ SOLN: 5.0000 mL | INTRAMUSCULAR | Status: DC | PRN

## 2018-07-07 MED ORDER — HEPARIN SODIUM (PORCINE) 1000 UNIT/ML DIALYSIS: 1000.0000 [IU] | INTRAMUSCULAR | Status: DC | PRN

## 2018-07-07 MED ORDER — PENTAFLUOROPROP-TETRAFLUOROETH EX AERO: 1.0000 "application " | INHALATION_SPRAY | CUTANEOUS | Status: DC | PRN

## 2018-07-07 NOTE — Progress Notes (Signed)
PROGRESS NOTE    DANN VENTRESS  YIR:[id number] DOB: 1936-01-19 DOA: 07/05/2018 PCP: Patrick Jupiter    Brief Narrative:  82 year old female who presented with chest pressure.  She does have the significant past medical history for end-stage renal disease on hemodialysis, (M/W/F), hypertension, type 2 diabetes mellitus and chronic hypoxic respiratory failure.  Nonambulatory at baseline.  Reported chest pressure, radiated to her neck, associated with dyspnea, chronic abdominal pain and nausea.  Patient was taken off warfarin and metoprolol July 7.  Still taking amiodarone.  On initial physical examination blood pressure 102/25, heart rate 44, respiratory rate 13, oxygen saturation 100%.  Lungs clear to auscultation bilaterally, heart S1-S2 present, bradycardic, no gallops, rubs or murmurs, the abdomen was protuberant, nontender, nondistended, no lower extremity edema.  Sodium 140, potassium 4.6, chloride 103, bicarb 25, glucose 154, BUN 23, creatinine 3.21, troponin < 0.03, white count 10.4, hemoglobin 10.5, hematocrit 35.5, platelets 242.  Chest x-ray with a chronic left pleural effusion.  EKG with junctional rhythm, 46 bpm, normal axis.  Patient was admitted to the hospital with a working diagnosis of atypical chest pain, rule out acute coronary syndrome, complicated by junctional bradycardia.  Assessment & Plan:   Principal Problem:   Chest pain Active Problems:   Hypotension   ESRD on dialysis (HCC)   Paroxysmal atrial fibrillation (HCC)   Type 2 diabetes mellitus with diabetic neuropathy (HCC)   Bradycardia   1. Junctional bradycardia. Patient has remained hemodynamically stable since admission, will continue to monitor telemetry, holding AV blocker, including amiodarone.   2. ESRD. Patient euvolemic today, hemodynamically stable despite bradycardia, schedule to have HD today, discussed with Dr. Melvia Heaps from nephrology. K at 4,6 and serum bicarbonate at 25. Continue midodrine on HD.  Continue phoslo.   3. T2DM. Patient at home diet controlled, fasting glucose 154, will add insulin sliding scale for glucose cover and monitoring.   5. Anxiety. Continue diazepam at night, per home regimen.    DVT prophylaxis: heparin sq  Code Status: full Family Communication: no family at the bedside  Disposition Plan/ discharge barriers:  Pending HR and blood pressure possible discharge in am.    Consultants:   Nephrology   Cardiology   Procedures:     Antimicrobials:       Subjective: Patient is feeling well, no nausea or vomiting, no further chest pain. Asking to liberate his diet.   Objective: Vitals:   07/06/18 2303 07/06/18 2356 07/07/18 0437 07/07/18 0729  BP: (!) 147/56 (!) 150/45 (!) 146/57 (!) 159/60  Pulse: 77 (!) 43 (!) 39 (!) 47  Resp: (!) 21 20 18 18   Temp: 98.5 F (36.9 C) 98.5 F (36.9 C) 98.1 F (36.7 C)   TempSrc: Oral Oral Oral Oral  SpO2:  100% 100% 100%  Weight: 68.9 kg (152 lb)     Height: 5\' 5"  (1.651 m)       Intake/Output Summary (Last 24 hours) at 07/07/2018 1131 Last data filed at 07/06/2018 2101 Gross per 24 hour  Intake 240 ml  Output -  Net 240 ml   Filed Weights   07/06/18 2303  Weight: 68.9 kg (152 lb)    Examination:   General: Not in pain or dyspnea, deconditioned  Neurology: Awake and alert, non focal  E ENT: mild pallor, no icterus, oral mucosa moist Cardiovascular: No JVD. S1-S2 present, rhythmic, bradycardic, no gallops, rubs, or murmurs. Trace lower extremity edema. Pulmonary: decreased breath sounds at the left base, but adequate air movement, no  wheezing, rhonchi or rales. Gastrointestinal. Abdomen protuberant with no organomegaly, non tender, no rebound or guarding Skin. No rashes Musculoskeletal: no joint deformities     Data Reviewed: I have personally reviewed following labs and imaging studies  CBC: Recent Labs  Lab 07/05/18 2129 07/06/18 0428  WBC 9.8 10.4  NEUTROABS  --  5.0  HGB 11.7*  10.5*  HCT 37.9 35.5*  MCV 105.9* 107.9*  PLT 259 741   Basic Metabolic Panel: Recent Labs  Lab 07/05/18 2129 07/06/18 0428  NA 139 140  K 4.4 4.6  CL 100 103  CO2 28 25  GLUCOSE 163* 154*  BUN 18 23  CREATININE 2.82* 3.21*  CALCIUM 8.2* 7.9*   GFR: Estimated Creatinine Clearance: 13.6 mL/min (A) (by C-G formula based on SCr of 3.21 mg/dL (H)). Liver Function Tests: No results for input(s): AST, ALT, ALKPHOS, BILITOT, PROT, ALBUMIN in the last 168 hours. No results for input(s): LIPASE, AMYLASE in the last 168 hours. No results for input(s): AMMONIA in the last 168 hours. Coagulation Profile: Recent Labs  Lab 07/05/18 2129  INR 1.03   Cardiac Enzymes: Recent Labs  Lab 07/06/18 0117 07/06/18 0428 07/06/18 0804  TROPONINI <0.03 <0.03 <0.03   BNP (last 3 results) No results for input(s): PROBNP in the last 8760 hours. HbA1C: No results for input(s): HGBA1C in the last 72 hours. CBG: Recent Labs  Lab 07/06/18 1700 07/06/18 2057 07/07/18 0728  GLUCAP 139* 153* 137*   Lipid Profile: No results for input(s): CHOL, HDL, LDLCALC, TRIG, CHOLHDL, LDLDIRECT in the last 72 hours. Thyroid Function Tests: Recent Labs    07/06/18 0115  TSH 2.099   Anemia Panel: No results for input(s): VITAMINB12, FOLATE, FERRITIN, TIBC, IRON, RETICCTPCT in the last 72 hours.    Radiology Studies: I have reviewed all of the imaging during this hospital visit personally     Scheduled Meds: . allopurinol  100 mg Oral Daily  . aspirin  81 mg Oral Daily  . barrier cream  1 application Topical BID  . Chlorhexidine Gluconate Cloth  6 each Topical Q0600  . diazepam  5 mg Oral QHS  . dorzolamide-timolol  1 drop Left Eye BID  . feeding supplement (NEPRO CARB STEADY)  237 mL Oral BID BM  . fluconazole  150 mg Oral Q M,W,F-HD  . heparin  5,000 Units Subcutaneous Q8H  . hydroxypropyl methylcellulose / hypromellose  1 drop Both Eyes TID  . midodrine  10 mg Oral Q M,W,F  .  mupirocin ointment  1 application Nasal BID   Continuous Infusions:   LOS: 0 days        Paulanthony Gleaves Gerome Apley, MD [facility] Pager [phone]

## 2018-07-07 NOTE — Clinical Social Work Note (Signed)
Clinical Social Work Assessment  Patient Details  Name: [name] James MRN: [id number] Date of Birth: 1936/07/29  Date of referral:  07/07/18               Reason for consult:  Facility Placement                Permission sought to share information with:  Facility Sport and exercise psychologist, Family Supports Permission granted to share information::  Yes, Verbal Permission Granted  Name::        Agency::  [facility]  Relationship::     Contact Information:     Housing/Transportation Living arrangements for the past 2 months:  [place] of Information:  Patient Patient Interpreter Needed:  None Criminal Activity/Legal Involvement Pertinent to Current Situation/Hospitalization:  No - Comment as needed Significant Relationships:  Adult Children, Other Family Members Lives with:  Facility Resident Do you feel safe going back to the place where you live?  Yes Need for family participation in patient care:  No (Coment)  Care giving concerns: Patient is a long term care resident at [facility]. HD MWF.    Social Worker assessment / plan: CSW confirmed with [place] that patient is a long term care resident.  CSW met with patient at bedside. Patient alert and oriented, sitting up and preparing to eat breakfast. CSW introduced self and role and discussed discharge planning. Patient reported she has lived at [facility] about 4 years and is agreeable to return. Today is an HD day for patient - plan to do dialysis and then likely discharge back to facility. Patient indicated she prefers to contact family herself. CSW to follow and support with discharge planning.  Employment status:  Retired Forensic scientist:  Information systems manager, Medicaid In [place] PT Recommendations:  Not assessed at this time [place] / Referral to community resources:  [place]  Patient/Family's Response to care: Patient appreciative of care.  Patient/Family's Understanding of  and Emotional Response to Diagnosis, Current Treatment, and Prognosis: Patient with understanding of conditions and agreeable to return to facility.  Emotional Assessment Appearance:  Appears stated age Attitude/Demeanor/Rapport:  Engaged Affect (typically observed):  Accepting, Calm, Appropriate, Pleasant Orientation:  Oriented to Self, Oriented to Place, Oriented to  Time, Oriented to Situation Alcohol / Substance use:  Not Applicable Psych involvement (Current and /or in the community):  No (Comment)  Discharge Needs  Concerns to be addressed:  Discharge Planning Concerns, Care Coordination Readmission within the last 30 days:  Yes Current discharge risk:  Physical Impairment Barriers to Discharge:  Continued Medical Work up   Estanislado Emms, LCSW 07/07/2018, 10:10 AM

## 2018-07-07 NOTE — Consult Note (Addendum)
Cardiology Consultation:   Patient ID: ELKY FUNCHES; [id number]; [birth year]   Admit date: 07/05/2018 Date of Consult: 07/07/2018  Primary Care Provider: [facility], [place] Primary Cardiologist: Sanda Abisai Deer, [place]  Primary Electrophysiologist:  none   Patient Profile:   [name] James is a 82 y.o. female with a hx of ESRF on HD, PAFib not on a/c 2/2 GIB, DM, hx of breast c/a w/hx of b/l mastectomies, she has chronic hypoxic resp failure on O2, reported to have a chronic L pleural effusion, known coronary/aortic atherosclerosis, no caths, negative myoview in 2015, she is a resident at [facility] and described as wheelchair/bed bound who is being seen today for the evaluation of bradycardia at the request of Dr. Irish Lack.  History of Present Illness:   Ms. Deland was admitted to [facility] 07/05/18 with c/o CP that started while at HD, she was found to be bradycardic with some observed junctional rhythm her amiodarone and lopressor were held.  She had no ischemic looking EKGs and Trop x3 were negative with no plans for an ischemic w/u.  She is known to have some low BP issues with HD, no known brady hx.  She takes Midodrine 77m prior to HD and 5 mg 1/2 way through.  Occasionally she is symptomatic with the hypotension, "they just put my head back till it passes".  She tells me last week was a tough week for her, she did not feel well with HD on Friday, was sick to her stomach and had more then usual lightheadedness with her low BP, did fairly well over the weekend.  Monday again not feeling very well with HD, again nauseous, after HD on Monday she developed a "terrible heaviness" in her chest like a brick was sitting on her, she did not have associated diziness or lightheadedness, no palpitations.  The heaviness wrapped around to far left chest, she did feel more breathless, asked staff to give her a breathing treatment and given he chest complaints called family and was brought in.  She has been  bed/wheelchair bound now for about 3 years, sounds like functional decline, very poor gait, falls.  She has hx of fainting "many many years ago" she can not recall the scenario.      LABS K+ 4.6 BUN/Creat 23/3.21 Trop I: <0.03 (x3) WBC 10.4 H/H 10/35 Plts 242 TSH 2.099   Past Medical History:  Diagnosis Date  . Anemia, chronic disease   . Anxiety   . Arthritis    knees  . Atrial fibrillation ([place]   . Barrett esophagus   . Benign paroxysmal positional vertigo   . [place] [facility]   bil mastectomies, no radiation or chemo  . Cataract   . Chest pain, atypical 12/05/2008   R/Lmv- normal perfusion all regions, noe ECG changes   . CHF (congestive heart failure) ([place] 05/13/2011   echo - EHE>17% stage 1 diastolic dysfunction; elevated LV filling pressure, MAC  . CKD (chronic kidney disease), stage III ([place]   . Claudication ([facility] 10/30/2005   doppler - normal evaluation, no evidence of aneurysm, diameter reduction, dissection, compression or vascular abnormality)  . Complication of anesthesia   . Dyslipidemia   . Dysrhythmia    Atrial Fibrillation  . Edema   . ESRD (end stage renal disease) [place] dialysis ([place]    "[facility]; [address]; MWF" (07/06/2018)  . Family history of adverse reaction to anesthesia    son also has nausea   . Fever blister 12/2016  . Gastritis  and gastroduodenitis   . GERD (gastroesophageal reflux disease)   . Gout   . Hemodialysis-associated hypotension   . HLD (hyperlipidemia)   . Hypertension   . IBS (irritable bowel syndrome)   . Insomnia   . Morbid obesity ([place])   . On home oxygen therapy    "2L; 24/7" (07/06/2018)  . PAF (paroxysmal atrial fibrillation) ([place])   . Peripheral neuropathy   . Pneumonia 12/2016  . PONV (postoperative nausea and vomiting)   . Renal cyst   . Renal insufficiency 10/30/2005   doppler - abn resistance consistent w/ parenchymal disease  . Renovascular hypertension   . Type 2 diabetes mellitus with diabetic  neuropathy, with long-term current use of insulin ([place])   . Vertigo     Past Surgical History:  Procedure Laterality Date  . A/V FISTULAGRAM Left 03/19/2018   Procedure: A/V FISTULAGRAM;  Surgeon: Angelia Mould, MD;  Location: [place] CV LAB;  Service: Cardiovascular;  Laterality: Left;  . A/V SHUNTOGRAM Left 01/19/2018   Procedure: A/V SHUNTOGRAM;  Surgeon: Serafina Mitchell, MD;  Location: [place] [facility];  Service: Cardiovascular;  Laterality: Left;  . ANGIOPLASTY Left 02/12/2015   Procedure: ANGIOPLASTY;  Surgeon: Angelia Mould, MD;  Location: [facility] CATH LAB;  Service: Cardiovascular;  Laterality: Left;  AVF  . AV FISTULA PLACEMENT Left 11/30/2014   Procedure: ARTERIOVENOUS (AV) FISTULA CREATION LEFT ARM;  Surgeon: Angelia Mould, MD;  Location: [place];  Service: Vascular;  Laterality: Left;  . AV FISTULA PLACEMENT Left 03/20/2015   Procedure:  Inserton of Left Upper Arm Gortex Graft;  Surgeon: Angelia Mould, MD;  Location: [place];  Service: Vascular;  Laterality: Left;  . AV FISTULA PLACEMENT Left 04/12/2015   Procedure: INSERTION OF LEFT ARM  ARTERIOVENOUS GORE-TEX GRAFT ;  Surgeon: Angelia Mould, MD;  Location: [place];  Service: Vascular;  Laterality: Left;  . AV FISTULA PLACEMENT Right 06/08/2018   Procedure: INSERTION OF ARTERIOVENOUS (AV) GORE-TEX GRAFT ARM USING 4-7MM X 45CM GORETEX GRAFT;  Surgeon: Angelia Mould, MD;  Location: [place];  Service: Vascular;  Laterality: Right;  . BACK SURGERY  ~1980  . CATARACT EXTRACTION Right   . CHOLECYSTECTOMY    . ESOPHAGOGASTRODUODENOSCOPY N/A 10/24/2014   Procedure: ESOPHAGOGASTRODUODENOSCOPY (EGD);  Surgeon: Jerene Bears, MD;  Location[facility] ENDOSCOPY;  Service: Endoscopy;  Laterality: N/A;  . EXCHANGE OF A DIALYSIS CATHETER Left 11/30/2014   Procedure: EXCHANGE OF A DIALYSIS CATHETER, LEFT INTERNAL JUGULAR;  Surgeon: Angelia Mould, MD;  Location: [place];  Service: Vascular;  Laterality: Left;    . EYE SURGERY Left    transplant  . FISTULOGRAM N/A 02/12/2015   Procedure: FISTULOGRAM;  Surgeon: Angelia Mould, MD;  [facility] LAB;  Service: Cardiovascular;  Laterality: N/A;  . HEMORRHOID SURGERY    . LIGATION OF ARTERIOVENOUS  FISTULA Left 03/20/2015   Procedure: LIGATION OF ARTERIOVENOUS  FISTULA;  Surgeon: Angelia Mould, MD;  Location: [place];  Service: Vascular;  Laterality: Left;  Marland Kitchen MASTECTOMY Bilateral ~1982   bil breast reconstruction with implants  . ORIF TIBIA & FIBULA FRACTURES Left 2007   also had left non displaced malleolar fracture.   Marland Kitchen PERIPHERAL VASCULAR BALLOON ANGIOPLASTY Left 01/19/2018   Procedure: PERIPHERAL VASCULAR BALLOON ANGIOPLASTY;  Surgeon: Serafina Mitchell, MD;  Location: [place] CV LAB;  Service: Cardiovascular;  Laterality: Left;  fistula  . PERIPHERAL VASCULAR BALLOON ANGIOPLASTY Left 03/19/2018   Procedure: PERIPHERAL VASCULAR BALLOON  ANGIOPLASTY;  Surgeon: Angelia Mould, MD;  Location: [place] CV LAB;  Service: Cardiovascular;  Laterality: Left;  UPPER ARM FISTULA  . REMOVAL OF GRAFT Left 04/12/2015   Procedure: REMOVAL OF LEFT ARM ARTERIOVENOUS GORE-TEX GRAFT;  Surgeon: Angelia Mould, MD;  Location: [facility] OR;  Service: Vascular;  Laterality: Left;       Inpatient Medications: Scheduled Meds: . allopurinol  100 mg Oral Daily  . aspirin  81 mg Oral Daily  . barrier cream  1 application Topical BID  . Chlorhexidine Gluconate Cloth  6 each Topical Q0600  . diazepam  5 mg Oral QHS  . dorzolamide-timolol  1 drop Left Eye BID  . feeding supplement (NEPRO CARB STEADY)  237 mL Oral BID BM  . fluconazole  150 mg Oral Q M,W,F-HD  . heparin  5,000 Units Subcutaneous Q8H  . hydroxypropyl methylcellulose / hypromellose  1 drop Both Eyes TID  . midodrine  10 mg Oral Q M,W,F  . mupirocin ointment  1 application Nasal BID   Continuous Infusions:  PRN Meds: acetaminophen, fentaNYL (SUBLIMAZE) injection, gi cocktail,  loperamide, ondansetron (ZOFRAN) IV, oxyCODONE, promethazine  Allergies:    Allergies  Allergen Reactions  . Erythromycin Swelling  . Codeine Palpitations, Rash and Other (See Comments)    GI Upset  . Erythromycin Base Other (See Comments)    GI Upset  . Penicillin G Rash    Has patient had a PCN reaction causing immediate rash, facial/tongue/throat swelling, SOB or lightheadedness with hypotension: Yes Has patient had a PCN reaction causing severe rash involving mucus membranes or skin necrosis: Unk Has patient had a PCN reaction that required hospitalization: Unk Has patient had a PCN reaction occurring within the last 10 years: Unk If all of the above answers are "NO", then may proceed with Cephalosporin use..  . Penicillins Rash and Other (See Comments)    Has taken Keflex & Rocephin many times without problems Has patient had a PCN reaction causing immediate rash, facial/tongue/throat swelling, SOB or lightheadedness with hypotension: Yes Has patient had a PCN reaction causing severe rash involving mucus membranes or skin necrosis: Unk Has patient had a PCN reaction that required hospitalization: Unk Has patient had a PCN reaction occurring within the last 10 years: Unk If all of the above answers are "NO", then may proceed with Cephalosporin use.   Keturah Shavers [Ranitidine Hcl] Nausea And Vomiting    Social History:   Social History   Socioeconomic History  . Marital status: Divorced    Spouse name: Not on file  . Number of children: Not on file  . Years of education: Not on file  . Highest education level: Not on file  Occupational History  . Not on file  Social Needs  . Financial resource strain: Not on file  . Food insecurity:    Worry: Not on file    Inability: Not on file  . Transportation needs:    Medical: Not on file    Non-medical: Not on file  Tobacco Use  . Smoking status: Never Smoker  . Smokeless tobacco: Never Used  Substance and Sexual Activity  .  Alcohol use: No  . Drug use: No  . Sexual activity: Not on file  Lifestyle  . Physical activity:    Days per week: Not on file    Minutes per session: Not on file  . Stress: Not on file  Relationships  . Social connections:    Talks on phone: Not on  file    Gets together: Not on file    Attends religious service: Not on file    Active member of club or organization: Not on file    Attends meetings of clubs or organizations: Not on file    Relationship status: Not on file  . Intimate partner violence:    Fear of current or ex partner: Not on file    Emotionally abused: Not on file    Physically abused: Not on file    Forced sexual activity: Not on file  Other Topics Concern  . Not on file  Social History Narrative   [facility] (10/06/17):   Originally from [place] [facility] [place]. Has always lived in [place]. She currently lives in [place]. No bird exposure. [place] Previously traveled to [place], [place], & [place]. Previously worked in a nursery and also a Pharmacist, community.     Family History:    Family History  Problem Relation Age of Onset  . Diabetes Mother   . Heart disease Mother   . Non-Hodgkin's lymphoma Mother   . Heart attack Father   . Stroke Father   . Parkinson's disease Brother   . Lung disease Brother   . Heart disease Brother   . Multiple myeloma Sister      ROS:  Please see the history of present illness.  All other ROS reviewed and negative.     Physical Exam/Data:   Vitals:   07/06/18 2303 07/06/18 2356 07/07/18 0437 07/07/18 0729  BP: (!) 147/56 (!) 150/45 (!) 146/57 (!) 159/60  Pulse: 77 (!) 43 (!) 39 (!) 47  Resp: (!) 21 20 18 18   Temp: 98.5 F (36.9 C) 98.5 F (36.9 C) 98.1 F (36.7 C)   TempSrc: Oral Oral Oral Oral  SpO2:  100% 100% 100%  Weight: 152 lb (68.9 kg)     Height: 5' 5"  (1.651 m)       Intake/Output Summary (Last 24 hours) at 07/07/2018 1136 Last data filed at 07/06/2018 2101 Gross per 24 hour  Intake 240 ml  Output -  Net 240 ml     Filed Weights   07/06/18 2303  Weight: 152 lb (68.9 kg)   Body mass index is 25.29 kg/m.  General:  Well nourished, well developed, in no acute distress HEENT: normal Lymph: no adenopathy Neck: no JVD Endocrine:  No thryomegaly Vascular: No carotid bruits  Cardiac:  RRR; 1/6 SM, gallops or rubs                 She has had prior extensive chest/breast surgery, b/l mastectomies and has visible superficial veins noted Lungs:  CTA b/l, no wheezing, rhonchi or rales  Abd: soft, nontender  Ext: no edema Musculoskeletal:  No deformities Skin: warm and dry  Neuro:  No gross focal abnormalities noted, non-ambulatory Psych:  Normal affect   EKG:  The EKG was personally reviewed and demonstrates:   07/05/18 #1 junctional rhythm, 46bpm, QRS 147m #2 baseline artifact, looks junctional rhythm, QRS 1089m 04/06/18 SR 61bpm,  poor R progression Telemetry:  Telemetry was personally reviewed and demonstrates:     Relevant CV Studies:  07/06/18 Study Conclusions - Left ventricle: The cavity size was normal. Wall thickness was   increased in a pattern of mild LVH. Systolic function was   vigorous. The estimated ejection fraction was in the range of 65%   to 70%. Wall motion was normal; there were no regional wall   motion abnormalities.  The study is not technically sufficient to   allow evaluation of LV diastolic function. - Aortic valve: There was trivial regurgitation. - Mitral valve: Calcified annulus. - Left atrium: The atrium was severely dilated. - Pulmonary arteries: Systolic pressure was mildly increased. PA   peak pressure: 44 mm Hg (S). Impressions: - Vigorous LV systolic function; mild LVH; sclerotic aortic valve   with trace AI; mildly elevated LVOT velocity of 2.3 m/s likely   related to vigorous LV systolic function; severe LAE; mild TR   with mild pulmonary hypertension.  Myoview 12/20/14: 1. No reversible ischemia. Small mid inferolateral wall infarct. 2. Normal  left ventricular wall motion. 3. Left ventricular ejection fraction 68% 4. Low-risk stress test findings   Laboratory Data:  Chemistry Recent Labs  Lab 07/05/18 2129 07/06/18 0428  NA 139 140  K 4.4 4.6  CL 100 103  CO2 28 25  GLUCOSE 163* 154*  BUN 18 23  CREATININE 2.82* 3.21*  CALCIUM 8.2* 7.9*  GFRNONAA 15* 13*  GFRAA 17* 15*  ANIONGAP 11 12    No results for input(s): PROT, ALBUMIN, AST, ALT, ALKPHOS, BILITOT in the last 168 hours. Hematology Recent Labs  Lab 07/05/18 2129 07/06/18 0428  WBC 9.8 10.4  RBC 3.58* 3.29*  HGB 11.7* 10.5*  HCT 37.9 35.5*  MCV 105.9* 107.9*  MCH 32.7 31.9  MCHC 30.9 29.6*  RDW 16.9* 17.0*  PLT 259 242   Cardiac Enzymes Recent Labs  Lab 07/06/18 0117 07/06/18 0428 07/06/18 0804  TROPONINI <0.03 <0.03 <0.03    Recent Labs  Lab 07/05/18 2132 07/06/18 0117  TROPIPOC 0.00 0.01    BNPNo results for input(s): BNP, PROBNP in the last 168 hours.  DDimer No results for input(s): DDIMER in the last 168 hours.  Radiology/Studies:   Dg Chest 2 View Result Date: 07/05/2018 CLINICAL DATA:  Chest pain starting during dialysis radiating to the back EXAM: CHEST - 2 VIEW COMPARISON:  Chest CT from 01/05/2018 FINDINGS: Atherosclerotic calcification of the aortic arch. Blunted left costophrenic angle with left basilar opacity favoring pleural effusion with passive atelectasis. Right lung appears clear. The pulmonary nodule seen on prior chest CT are not readily apparent on conventional radiography today. Heart size within normal limits. Thoracic spondylosis. IMPRESSION: 1. Heart size within normal limits. 2. Stable left pleural effusion with passive atelectasis. 3. Thoracic spondylosis. 4.  Atherosclerotic calcification of the aortic arch. 5. Conventional radiography does not exclude the possibility of aortic dissection. Electronically Signed   By: Van Clines M.D.   On: 07/05/2018 20:53    Assessment and Plan:   1. Junctional  bradycardia     I am not convinced that she is overly symptomatic with this     Narrow QRS     She is in SR 60's as we talk.  Home meds include amiodarone, this will not wash out for months and given PAF and inability to a/c, may best be continued. Lopressor, unclear how often or if she is getting this  She has an increased risk for infection complications given HD She has had extensive chest surgery she states with numerous large cysts (not cancer) removed, essentially she states b/l mastectomies, with much dissection.  She has veins noted chest wall, suspect some degree of UE chronic venous thrombus/occlusion.   She has a new HD graft R arm, not yet in use, placed 06/08/18, and old AVF R upper arm, still functioning, though poorly at times.  Given she is wheelchair/bedbound, without clear  symptoms of bradycardia, I am not convinced pacing is the best strategy.  The patient would prefer to avoid if possible as well.    Avoid all nodal blocking agents otherwise.  Dr. Caryl Comes will see this afternoon   For questions or updates, please contact [facility] Please consult [email or web address] for contact info under Cardiology/STEMI.   Signed, Baldwin Jamaica, PA-C  07/07/2018 11:36 AM   Junctional Bradycardia  PAF    [place]  ESRD  HD    Hypotension with HD   Nonambulatory x yrs   The patient's nonambulatory status makes moot largely the issue of bradycardia.  She takes amiodarone and Lopressor both of which will admit this issue.  Concurrent with the decision to stop these medications.  Rapid atrial fibrillation might necessitate readdressing tachycardia/bradycardia issue.  Bradycardia seems to be improving off of betablocker   In the context of her nausea, junctional rhythm might have been a vagal response; has been seen in the hospital.  Question related to sleep.  Further issues related to device implantation would be high risk of infection and difficulty with access given her  dialysis access issues  Plz call if  we can be of further assistance.

## 2018-07-07 NOTE — Procedures (Signed)
I was present at this dialysis session. I have reviewed the session itself and made appropriate changes.   Filed Weights   07/06/18 2303  Weight: 68.9 kg (152 lb)    Recent Labs  Lab 07/06/18 0428  NA 140  K 4.6  CL 103  CO2 25  GLUCOSE 154*  BUN 23  CREATININE 3.21*  CALCIUM 7.9*    Recent Labs  Lab 07/05/18 2129 07/06/18 0428  WBC 9.8 10.4  NEUTROABS  --  5.0  HGB 11.7* 10.5*  HCT 37.9 35.5*  MCV 105.9* 107.9*  PLT 259 242    Scheduled Meds: . allopurinol  100 mg Oral Daily  . aspirin  81 mg Oral Daily  . barrier cream  1 application Topical BID  . calcium acetate  1,334 mg Oral TID WC  . Chlorhexidine Gluconate Cloth  6 each Topical Q0600  . diazepam  5 mg Oral QHS  . dorzolamide-timolol  1 drop Left Eye BID  . feeding supplement (NEPRO CARB STEADY)  237 mL Oral BID BM  . fluconazole  150 mg Oral Q M,W,F-HD  . heparin  5,000 Units Subcutaneous Q8H  . hydroxypropyl methylcellulose / hypromellose  1 drop Both Eyes TID  . insulin aspart  0-9 Units Subcutaneous TID WC  . midodrine  10 mg Oral Q M,W,F  . mupirocin ointment  1 application Nasal BID   Continuous Infusions: . sodium chloride    . sodium chloride     PRN Meds:.sodium chloride, sodium chloride, acetaminophen, fentaNYL (SUBLIMAZE) injection, gi cocktail, heparin, heparin, lidocaine (PF), lidocaine-prilocaine, loperamide, ondansetron (ZOFRAN) IV, oxyCODONE, pentafluoroprop-tetrafluoroeth, promethazine   Pearson Grippe  MD 07/07/2018, 3:10 PM

## 2018-07-07 NOTE — Progress Notes (Addendum)
Progress Note  Patient Name: [name] James Date of Encounter: 07/07/2018  Primary Cardiologist: Sanda Klein, MD   Subjective   Pt states she feels much better, chest pain has nearly resolved and she denies palpitations. No dizziness, or awareness of her bradycardia.   Inpatient Medications    Scheduled Meds: . allopurinol  100 mg Oral Daily  . aspirin  81 mg Oral Daily  . barrier cream  1 application Topical BID  . Chlorhexidine Gluconate Cloth  6 each Topical Q0600  . diazepam  5 mg Oral QHS  . dorzolamide-timolol  1 drop Left Eye BID  . feeding supplement (NEPRO CARB STEADY)  237 mL Oral BID BM  . fluconazole  150 mg Oral Q M,W,F-HD  . heparin  5,000 Units Subcutaneous Q8H  . hydroxypropyl methylcellulose / hypromellose  1 drop Both Eyes TID  . midodrine  10 mg Oral Q M,W,F  . mupirocin ointment  1 application Nasal BID   Continuous Infusions:  PRN Meds: acetaminophen, fentaNYL (SUBLIMAZE) injection, gi cocktail, ondansetron (ZOFRAN) IV, oxyCODONE, promethazine   Vital Signs    Vitals:   07/06/18 2303 07/06/18 2356 07/07/18 0437 07/07/18 0729  BP: (!) 147/56 (!) 150/45 (!) 146/57 (!) 159/60  Pulse: 77 (!) 43 (!) 39 (!) 47  Resp: (!) 21 20 18 18   Temp: 98.5 F (36.9 C) 98.5 F (36.9 C) 98.1 F (36.7 C)   TempSrc: Oral Oral Oral Oral  SpO2:  100% 100% 100%  Weight: 152 lb (68.9 kg)     Height: 5\' 5"  (1.651 m)       Intake/Output Summary (Last 24 hours) at 07/07/2018 0857 Last data filed at 07/06/2018 2101 Gross per 24 hour  Intake 240 ml  Output -  Net 240 ml   Filed Weights   07/06/18 2303  Weight: 152 lb (68.9 kg)    Telemetry    Sinus brady and junctional - HR as low as 28 - Personally Reviewed  ECG    No new tracings - Personally Reviewed  Physical Exam   GEN: No acute distress.   Neck: No JVD Cardiac: regular rhythm, bradycardic rate Respiratory: Clear to auscultation bilaterally, but diminished in bases. GI: Soft, nontender,  non-distended  MS: trace edema; left foot deformity. Neuro:  Nonfocal  Psych: Normal affect   Labs    Chemistry Recent Labs  Lab 07/05/18 2129 07/06/18 0428  NA 139 140  K 4.4 4.6  CL 100 103  CO2 28 25  GLUCOSE 163* 154*  BUN 18 23  CREATININE 2.82* 3.21*  CALCIUM 8.2* 7.9*  GFRNONAA 15* 13*  GFRAA 17* 15*  ANIONGAP 11 12     Hematology Recent Labs  Lab 07/05/18 2129 07/06/18 0428  WBC 9.8 10.4  RBC 3.58* 3.29*  HGB 11.7* 10.5*  HCT 37.9 35.5*  MCV 105.9* 107.9*  MCH 32.7 31.9  MCHC 30.9 29.6*  RDW 16.9* 17.0*  PLT 259 242    Cardiac Enzymes Recent Labs  Lab 07/06/18 0117 07/06/18 0428 07/06/18 0804  TROPONINI <0.03 <0.03 <0.03    Recent Labs  Lab 07/05/18 2132 07/06/18 0117  TROPIPOC 0.00 0.01     BNPNo results for input(s): BNP, PROBNP in the last 168 hours.   DDimer No results for input(s): DDIMER in the last 168 hours.   Radiology    Dg Chest 2 View  Result Date: 07/05/2018 CLINICAL DATA:  Chest pain starting during dialysis radiating to the back EXAM: CHEST - 2 VIEW COMPARISON:  Chest CT from 01/05/2018 FINDINGS: Atherosclerotic calcification of the aortic arch. Blunted left costophrenic angle with left basilar opacity favoring pleural effusion with passive atelectasis. Right lung appears clear. The pulmonary nodule seen on prior chest CT are not readily apparent on conventional radiography today. Heart size within normal limits. Thoracic spondylosis. IMPRESSION: 1. Heart size within normal limits. 2. Stable left pleural effusion with passive atelectasis. 3. Thoracic spondylosis. 4.  Atherosclerotic calcification of the aortic arch. 5. Conventional radiography does not exclude the possibility of aortic dissection. Electronically Signed   By: Van Clines M.D.   On: 07/05/2018 20:53    Cardiac Studies   Echo 07/06/18: Study Conclusions - Left ventricle: The cavity size was normal. Wall thickness was   increased in a pattern of mild  LVH. Systolic function was   vigorous. The estimated ejection fraction was in the range of 65%   to 70%. Wall motion was normal; there were no regional wall   motion abnormalities. The study is not technically sufficient to   allow evaluation of LV diastolic function. - Aortic valve: There was trivial regurgitation. - Mitral valve: Calcified annulus. - Left atrium: The atrium was severely dilated. - Pulmonary arteries: Systolic pressure was mildly increased. PA   peak pressure: 44 mm Hg (S).  Impressions: - Vigorous LV systolic function; mild LVH; sclerotic aortic valve   with trace AI; mildly elevated LVOT velocity of 2.3 m/s likely   related to vigorous LV systolic function; severe LAE; mild TR   with mild pulmonary hypertension.  Patient Profile     82 y.o. female with a hx of ESRD on HD, paroxysmal atrial fibrillation not on [facility] for bleeding risk (GI bleed) on amiodarone, DM, chronic respiratory failure on home O2, HTN, aortic and CAD, normal nuc 2015, normal LVEF 2015 who is being seen today for the evaluation of chest pain during HD. She was also found to be bradycardic vs junctional rhythm on EKG.  Assessment & Plan    1. Bradycardia vs junctional rhythm, hx of Afib in 2015 no AC for GI bleed - telemetry with HR 28-78 ranging from sinus brady to junctional rhythm - HR in the 30s while awake, unclear if the HR of 28 was while asleep - she is largely unaware of her slow rhythm - continue to hold amiodarone and AV nodal agents, including lopressor - will consider EP consult, although unsure of intervention as she is HD-dependent - per notes from 2015 during Afib RVR in the setting of illness, evidence of tachy-brady at that time   2. Chest pain - this has largely resolved - troponins negative - echo ordered by primary team with normal LVEF and no RWMA, but increased PA pressure - will defer ischemic workup for now - fluid balance per HD   3. ESRD on HD - per  primary   4. Chronic respiratory failure - chronic home O2 use   For questions or updates, please contact [place] Please consult [email or web address] for contact info under Cardiology/STEMI.      Signed, Tami Lin Duke, PA  07/07/2018, 8:57 AM    I have examined the patient and reviewed assessment and plan and discussed with patient.  Agree with above as stated.  Back in sinus bradycardia this morning from junctional rhythm yesterday.  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  No syncope, or clear indication for pacemaker.  Given her overall status, I am not sure that pacemaker would be beneficial.  We will consider adding back a  very low dosage of amiodarone (50 mg) to hopefully prevent any tachycardia, but also decrease the risk of any bradycardia.  Ruled out for MI.  No plans for any ischemia work-up at this time.  Larae Grooms

## 2018-07-07 NOTE — Consult Note (Signed)
[facility] Renal Consultation Note    Indication for Consultation:  Management of ESRD/hemodialysis; anemia, hypertension/volume and secondary hyperparathyroidism  BWI:OMBTD, [place]  HPI: [name] James is a 82 y.o. female. ESRD on HD MWF at [facility], first starting in 11/2014.  Past medical history significant for DMT2, HTN, chronic respiratory failure on home O2, gout, GERD, Hx barrett's esophagus, IBD, and Hx breast Ca.    Seen and examined at bedside.  Reports coming the the ED 2 days ago due to chest pain that began while at nursing facility after dialysis on Monday, "felt like a brick on my chest".  States at her last 2 dialysis sessions she experienced hypotension with nausea and lightheadedness and did not reach her dry weight.  Currently chest pain free.  Denies SOB, palpitations, n/v/d, syncope, change in weakness and dizziness from baseline.  Biggest complaint at the moment is insomnia, reports averaging 1-2hrs of sleep nightly.    Patient has been admitted for further evaluation and management of chest pain and bradycardia.  Pertinent findings since admission include bradycardia with intermittent junction rhythm, ECHO showing preserved EF 65-70%, severe LAE and mild pulmonary HTN.    Past Medical History:  Diagnosis Date  . Anemia, chronic disease   . Anxiety   . Arthritis    knees  . Atrial fibrillation ([place])   . Barrett esophagus   . Benign paroxysmal positional vertigo   . Breast cancer ([place]) 1980s   bil mastectomies, no radiation or chemo  . Cataract   . Chest pain, atypical 12/05/2008   R/Lmv- normal perfusion all regions, noe ECG changes   . CHF (congestive heart failure) ([place]) 05/13/2011   echo - HR>41%; stage 1 diastolic dysfunction; elevated LV filling pressure, MAC  . CKD (chronic kidney disease), stage III ([place])   . Claudication ([place]) 10/30/2005   doppler - normal evaluation, no evidence of aneurysm, diameter reduction, dissection, compression or  vascular abnormality)  . Complication of anesthesia   . Dyslipidemia   . Dysrhythmia    Atrial Fibrillation  . Edema   . ESRD (end stage renal disease) on dialysis ([place])    "[facility]; [address]; MWF" (07/06/2018)  . Family history of adverse reaction to anesthesia    son also has nausea   . Fever blister 12/2016  . Gastritis and gastroduodenitis   . GERD (gastroesophageal reflux disease)   . Gout   . Hemodialysis-associated hypotension   . HLD (hyperlipidemia)   . Hypertension   . IBS (irritable bowel syndrome)   . Insomnia   . Morbid obesity ([place])   . On home oxygen therapy    "2L; 24/7" (07/06/2018)  . PAF (paroxysmal atrial fibrillation) ([place])   . Peripheral neuropathy   . Pneumonia 12/2016  . PONV (postoperative nausea and vomiting)   . Renal cyst   . Renal insufficiency 10/30/2005   doppler - abn resistance consistent w/ parenchymal disease  . Renovascular hypertension   . Type 2 diabetes mellitus with diabetic neuropathy, with long-term current use of insulin ([place])   . Vertigo    Past Surgical History:  Procedure Laterality Date  . A/V FISTULAGRAM Left 03/19/2018   Procedure: A/V FISTULAGRAM;  Surgeon: Angelia Mould, MD;  Location: [place] [facility];  Service: Cardiovascular;  Laterality: Left;  . A/V SHUNTOGRAM Left 01/19/2018   Procedure: A/V SHUNTOGRAM;  Surgeon: Serafina Mitchell, MD;  Location: [place] [facility];  Service: Cardiovascular;  Laterality: Left;  . ANGIOPLASTY Left 02/12/2015  Procedure: ANGIOPLASTY;  Surgeon: Angelia Mould, MD;  Location: [facility] CATH LAB;  Service: Cardiovascular;  Laterality: Left;  AVF  . AV FISTULA PLACEMENT Left 11/30/2014   Procedure: ARTERIOVENOUS (AV) FISTULA CREATION LEFT ARM;  Surgeon: Angelia Mould, MD;  Location: [place];  Service: Vascular;  Laterality: Left;  . AV FISTULA PLACEMENT Left 03/20/2015   Procedure:  Inserton of Left Upper Arm Gortex Graft;  Surgeon: Angelia Mould, MD;  Location: [place];   Service: Vascular;  Laterality: Left;  . AV FISTULA PLACEMENT Left 04/12/2015   Procedure: INSERTION OF LEFT ARM  ARTERIOVENOUS GORE-TEX GRAFT ;  Surgeon: Angelia Mould, MD;  Location: [place];  Service: Vascular;  Laterality: Left;  . AV FISTULA PLACEMENT Right 06/08/2018   Procedure: INSERTION OF ARTERIOVENOUS (AV) GORE-TEX GRAFT ARM USING 4-7MM X 45CM GORETEX GRAFT;  Surgeon: Angelia Mould, MD;  Location: [place];  Service: Vascular;  Laterality: Right;  . BACK SURGERY  ~1980  . CATARACT EXTRACTION Right   . CHOLECYSTECTOMY    . ESOPHAGOGASTRODUODENOSCOPY N/A 10/24/2014   Procedure: ESOPHAGOGASTRODUODENOSCOPY (EGD);  Surgeon: Jerene Bears, MD;  Location[facility] ENDOSCOPY;  Service: Endoscopy;  Laterality: N/A;  . EXCHANGE OF A DIALYSIS CATHETER Left 11/30/2014   Procedure: EXCHANGE OF A DIALYSIS CATHETER, LEFT INTERNAL JUGULAR;  Surgeon: Angelia Mould, MD;  Location: [place];  Service: Vascular;  Laterality: Left;  . EYE SURGERY Left    transplant  . FISTULOGRAM N/A 02/12/2015   Procedure: FISTULOGRAM;  Surgeon: Angelia Mould, MD;  Location: [facility] CATH LAB;  Service: Cardiovascular;  Laterality: N/A;  . HEMORRHOID SURGERY    . LIGATION OF ARTERIOVENOUS  FISTULA Left 03/20/2015   Procedure: LIGATION OF ARTERIOVENOUS  FISTULA;  Surgeon: Angelia Mould, MD;  Location: [place];  Service: Vascular;  Laterality: Left;  Marland Kitchen MASTECTOMY Bilateral ~1982   bil breast reconstruction with implants  . ORIF TIBIA & FIBULA FRACTURES Left 2007   also had left non displaced malleolar fracture.   Marland Kitchen PERIPHERAL VASCULAR BALLOON ANGIOPLASTY Left 01/19/2018   Procedure: PERIPHERAL VASCULAR BALLOON ANGIOPLASTY;  Surgeon: Serafina Mitchell, MD;  Location: [place] [facility];  Service: Cardiovascular;  Laterality: Left;  fistula  . PERIPHERAL VASCULAR BALLOON ANGIOPLASTY Left 03/19/2018   Procedure: PERIPHERAL VASCULAR BALLOON ANGIOPLASTY;  Surgeon: Angelia Mould, MD;  Location: [place] [facility];  Service: Cardiovascular;  Laterality: Left;  UPPER ARM FISTULA  . REMOVAL OF GRAFT Left 04/12/2015   Procedure: REMOVAL OF LEFT ARM ARTERIOVENOUS GORE-TEX GRAFT;  Surgeon: Angelia Mould, MD;  Location: [facility] OR;  Service: Vascular;  Laterality: Left;   Family History  Problem Relation Age of Onset  . Diabetes Mother   . Heart disease Mother   . Non-Hodgkin's lymphoma Mother   . Heart attack Father   . Stroke Father   . Parkinson's disease Brother   . Lung disease Brother   . Heart disease Brother   . Multiple myeloma Sister    Social History:  reports that she has never smoked. She has never used smokeless tobacco. She reports that she does not drink alcohol or use drugs. Allergies  Allergen Reactions  . Erythromycin Swelling  . Codeine Palpitations, Rash and Other (See Comments)    GI Upset  . Erythromycin Base Other (See Comments)    GI Upset  . Penicillin G Rash    Has patient had a PCN reaction causing immediate rash, facial/tongue/throat swelling, SOB or lightheadedness with hypotension:  Yes Has patient had a PCN reaction causing severe rash involving mucus membranes or skin necrosis: Unk Has patient had a PCN reaction that required hospitalization: Unk Has patient had a PCN reaction occurring within the last 10 years: Unk If all of the above answers are "NO", then may proceed with Cephalosporin use..  . Penicillins Rash and Other (See Comments)    Has taken Keflex & Rocephin many times without problems Has patient had a PCN reaction causing immediate rash, facial/tongue/throat swelling, SOB or lightheadedness with hypotension: Yes Has patient had a PCN reaction causing severe rash involving mucus membranes or skin necrosis: Unk Has patient had a PCN reaction that required hospitalization: Unk Has patient had a PCN reaction occurring within the last 10 years: Unk If all of the above answers are "NO", then may proceed with Cephalosporin use.   Keturah Shavers  [Ranitidine Hcl] Nausea And Vomiting   Prior to Admission medications   Medication Sig Start Date End Date Taking? Authorizing Provider  acetaminophen (TYLENOL) 325 MG tablet Take 650 mg by mouth every 6 (six) hours as needed for fever ("for vomiting/nausea," per [facility]).    Yes [provider]  allopurinol (ZYLOPRIM) 100 MG tablet Take 100 mg by mouth daily.   Yes [provider]  amiodarone (PACERONE) 200 MG tablet Take 200 mg by mouth daily.  06/08/15  Yes [name], MD  aspirin 81 MG chewable tablet Chew 81 mg by mouth daily.   Yes [provider]  B Complex-C-Folic Acid (NEPHRO VITAMINS) 0.8 MG TABS Take 1 tablet by mouth daily.   Yes [provider]  diazepam (VALIUM) 5 MG tablet Take one tablet by mouth every night at bedtime for rest Patient taking differently: Take 5 mg by mouth at bedtime.  02/04/17  Yes Carter, Monica, DO  DIMETHICONE, TOPICAL, 5 % CREA Apply 1 application topically See admin instructions. Place a nickel-sized amount onto buttock(s) area 2 times a day for barrier protection   Yes [provider]  dorzolamide-timolol (COSOPT) 22.3-6.8 MG/ML ophthalmic solution Place 1 drop into the left eye 2 (two) times daily. Wait 2-5 minutes when giving two eye meds   Yes [provider]  fluconazole (DIFLUCAN) 150 MG tablet Take 150 mg by mouth See admin instructions. Take 150 mg by mouth once a day on dialysis days only for 7 days for vaginal candidiasis 06/24/18 07/09/18 Yes [provider]  loperamide (IMODIUM A-D) 2 MG tablet Take 2 mg by mouth 3 (three) times daily as needed (for diarrhea).    Yes [provider]  loratadine (CLARITIN) 10 MG tablet Take 10 mg by mouth daily.   Yes [provider]  Melatonin 5 MG TABS Take 5 mg by mouth at bedtime.    Yes [provider]  midodrine (PROAMATINE) 10 MG tablet Take 10 mg by mouth See admin instructions. Take 10 mg by mouth WITH MORNING MEDS AT  FACILITY on Mon/Wed/Fri- dialysis days   Yes [provider]  midodrine (PROAMATINE) 5 MG tablet Take 5 mg by mouth See admin instructions. Take 2 tablets (10 mg) TO DIALYSIS on Mon/Wed/Fri to be dispensed by staff as directed (Dx code: I48.0: Paroxysmal fibrillation)   Yes [provider]  oxyCODONE (ROXICODONE) 5 MG immediate release tablet Take 1 tablet (5 mg total) by mouth every 4 (four) hours as needed for severe pain. Patient taking differently: Take 5 mg by mouth every 4 (four) hours as needed (for pain).  06/08/18  Yes Scot Dock,  Judeth Cornfield, MD  OXYGEN Inhale 2 L into the lungs continuous.    Yes [provider]  Polyethyl Glycol-Propyl Glycol (SYSTANE ULTRA) 0.4-0.3 % SOLN Place 1 drop into both eyes 3 (three) times daily.   Yes [provider]  promethazine (PHENERGAN) 25 MG tablet Take 25 mg by mouth every 8 (eight) hours as needed for nausea or vomiting.   Yes [provider]  fluticasone (FLONASE ALLERGY RELIEF) 50 MCG/ACT nasal spray Place 2 sprays into both nostrils daily.    [provider]  Hydrocortisone (GERHARDT'S BUTT CREAM) CREA Apply 1 application topically 3 (three) times daily.    [provider]  metoprolol tartrate (LOPRESSOR) 25 MG tablet Take 0.5 tablets (12.5 mg total) by mouth 2 (two) times daily. Hold if sbp less than 100 or heart rate less than 50, hold on dialysis day. Patient not taking: Reported on 07/05/2018 02/03/17   Florencia Reasons, MD  warfarin (COUMADIN) 4 MG tablet  06/24/18   [provider]   Current Facility-Administered Medications  Medication Dose Route Frequency Provider Last Rate Last Dose  . acetaminophen (TYLENOL) tablet 650 mg  650 mg Oral Q4H PRN Fuller Plan A, MD      . allopurinol (ZYLOPRIM) tablet 100 mg  100 mg Oral Daily Smith, Rondell A, MD   100 mg at 07/07/18 1002  . aspirin chewable tablet 81 mg  81 mg Oral Daily Smith, Rondell A, MD   81 mg at 07/07/18 1002  . barrier  cream (non-specified) 1 application  1 application Topical BID Norval Morton, MD   1 application at 20/35/59 1002  . Chlorhexidine Gluconate Cloth 2 % PADS 6 each  6 each Topical Q0600 Florencia Reasons, MD   6 each at 07/07/18 [phone]  . Chlorhexidine Gluconate Cloth 2 % PADS 6 each  6 each Topical Q0600 Adal [place]o, [place]      . diazepam (VALIUM) tablet 5 mg  5 mg Oral QHS Smith, Rondell A, MD   5 mg at 07/06/18 2115  . dorzolamide-timolol (COSOPT) 22.3-6.8 MG/ML ophthalmic solution 1 drop  1 drop Left Eye BID Fuller Plan A, MD   1 drop at 07/07/18 1003  . feeding supplement (NEPRO CARB STEADY) liquid 237 mL  237 mL Oral BID BM Florencia Reasons, MD   237 mL at 07/06/18 1842  . fentaNYL (SUBLIMAZE) injection 12.5 mcg  12.5 mcg Intravenous Q2H PRN Smith, Rondell A, MD      . fluconazole (DIFLUCAN) tablet 150 mg  150 mg Oral Q M,W,F-HD Tamala Julian, Rondell A, MD   150 mg at 07/07/18 1002  . gi cocktail (Maalox,Lidocaine,Donnatal)  30 mL Oral QID PRN Fuller Plan A, MD      . heparin injection 5,000 Units  5,000 Units Subcutaneous Q8H Fuller Plan A, MD   5,000 Units at 07/07/18 [phone]  . hydroxypropyl methylcellulose / hypromellose (ISOPTO TEARS / GONIOVISC) 2.5 % ophthalmic solution 1 drop  1 drop Both Eyes TID Florencia Reasons, MD      . loperamide (IMODIUM) capsule 2 mg  2 mg Oral PRN Arrien, Jimmy Picket, MD   2 mg at 07/07/18 1025  . midodrine (PROAMATINE) tablet 10 mg  10 mg Oral Q M,W,F Smith, Rondell A, MD   10 mg at 07/07/18 1002  . mupirocin ointment (BACTROBAN) 2 % 1 application  1 application Nasal BID Florencia Reasons, MD   1 application at 36/46/80 1002  . ondansetron (ZOFRAN) injection 4 mg  4 mg Intravenous Q6H  PRN Fuller Plan A, MD      . oxyCODONE (Oxy IR/ROXICODONE) immediate release tablet 5 mg  5 mg Oral Q4H PRN Fuller Plan A, MD      . promethazine (PHENERGAN) tablet 25 mg  25 mg Oral Q8H PRN Norval Morton, MD       Labs: Basic Metabolic Panel: Recent Labs  Lab 07/05/18 2129 07/06/18 0428   NA 139 140  K 4.4 4.6  CL 100 103  CO2 28 25  GLUCOSE 163* 154*  BUN 18 23  CREATININE 2.82* 3.21*  CALCIUM 8.2* 7.9*   CBC: Recent Labs  Lab 07/05/18 2129 07/06/18 0428  WBC 9.8 10.4  NEUTROABS  --  5.0  HGB 11.7* 10.5*  HCT 37.9 35.5*  MCV 105.9* 107.9*  PLT 259 242   Cardiac Enzymes: Recent Labs  Lab 07/06/18 0117 07/06/18 0428 07/06/18 0804  TROPONINI <0.03 <0.03 <0.03   CBG: Recent Labs  Lab 07/06/18 1700 07/06/18 2057 07/07/18 0728  GLUCAP 139* 153* 137*   Studies/Results: Dg Chest 2 View  Result Date: 07/05/2018 CLINICAL DATA:  Chest pain starting during dialysis radiating to the back EXAM: CHEST - 2 VIEW COMPARISON:  Chest CT from 01/05/2018 FINDINGS: Atherosclerotic calcification of the aortic arch. Blunted left costophrenic angle with left basilar opacity favoring pleural effusion with passive atelectasis. Right lung appears clear. The pulmonary nodule seen on prior chest CT are not readily apparent on conventional radiography today. Heart size within normal limits. Thoracic spondylosis. IMPRESSION: 1. Heart size within normal limits. 2. Stable left pleural effusion with passive atelectasis. 3. Thoracic spondylosis. 4.  Atherosclerotic calcification of the aortic arch. 5. Conventional radiography does not exclude the possibility of aortic dissection. Electronically Signed   By: Van Clines M.D.   On: 07/05/2018 20:53    ROS: All others negative except those listed in HPI.   Physical Exam: Vitals:   07/06/18 2303 07/06/18 2356 07/07/18 0437 07/07/18 0729  BP: (!) 147/56 (!) 150/45 (!) 146/57 (!) 159/60  Pulse: 77 (!) 43 (!) 39 (!) 47  Resp: (!) 21 20 18 18   Temp: 98.5 F (36.9 C) 98.5 F (36.9 C) 98.1 F (36.7 C)   TempSrc: Oral Oral Oral Oral  SpO2:  100% 100% 100%  Weight: 68.9 kg (152 lb)     Height: 5' 5"  (1.651 m)        General:  NAD, chronically ill appearing, pale, obese female Head: NCAT sclera not icteric MMM Neck: Supple. No  lymphadenopathy Lungs:  Diminished, mostly CTA bilaterally. No wheeze, rales or rhonchi. Breathing is unlabored on 3L O2 via [place]. Heart: bradycardia, RR. No murmur, rubs or gallops appreciated.  Abdomen: soft, nontender, +BS, no guarding, no rebound tenderness Lower extremities:trace edema b/l, no ischemic changes, or open wounds  Neuro: AAOx3. Moves all extremities spontaneously. Psych:  Responds to questions appropriately with a normal affect. Dialysis Access: LU AVG +b/t   RU AVG maturing (placed 06/08/18 Dr. Scot Dock) faint t/b +edema   Dialysis Orders:  MWF  - [facility] ([facility])  3.75hrs, BFR 400, DFR 800,  EDW 69.5kg, 3K/ 2Ca  Access: LU AVG, RU AVG placed 06/08/18 Dr. Scot Dock - not ready for use per follow up on 7/10  Heparin Bolus 3300 Unit Mircera 60 mcg q2wks - last 06/28/18 Venofer 68m qwk No VDRA   Assessment/Plan: 1.  Chest Pain - resolved. Cardio following 2. Bradycardia/junctional rhythm - amiodarone/metoprolol on hold. Per cards 3.  ESRD -  MWF HD pt.  Orders written today per regular schedule. K 4.6 yesterday. RFP pre HD  4.  Hypertension/volume  - BP elevated.  Does not appear volume overloaded on exam.  Titrate down volume as tolerated.  If weights correct under EDW at this time.  Net UF goal 1-2L as tolerated.  Will need to get hoyer weight to better assess edw.  5.  Anemia of CKD - Hgb 10.5. No indication for ESA at this time.  6.  Secondary Hyperparathyroidism -  Ca and phos in goal as OP.  Continue binders. Not on VDRA.  7.  Nutrition - Renal diet with fluid restrictions. Renavite. Nepro. 8.   Chronic respiratory failure - on home O2.   Jen Mow, PA-C [facility] Pager: [phone] 07/07/2018, 11:16 AM

## 2018-07-07 NOTE — Progress Notes (Signed)
   07/07/18 1200  Clinical Encounter Type  Visited With Patient  Visit Type Initial  Referral From Nurse;Patient  Consult/Referral To Chaplain  Spiritual Encounters  Spiritual Needs Prayer   Responded to a SCC for prayer.  Patient was alone and welcomed the visit.  She shared some about her condition and much about her faith.  She is very grounded in what she believes and that seems to bring her peace.  Shared some about her family.  We prayed together.  Will follow and support as needed. Chaplain Katherene Ponto

## 2018-07-07 NOTE — NC FL2 (Signed)
[place] MEDICAID FL2 LEVEL OF CARE SCREENING TOOL     IDENTIFICATION  Patient Name: [name] James Birthdate: [birth year] Sex: female Admission Date (Current Location): 07/05/2018  [facility] and [place] Number:  Herbalist and Address:  [facility], [place] [address], [place], [place] [address]      Provider Number: [id number]  Attending Physician Name and Address:  Tawni Millers,*  Relative Name and Phone Number:  Dynver Clemson, son, [phone]    Current Level of Care: Hospital Recommended Level of Care: [place] Prior Approval Number:    Date Approved/Denied:   PASRR Number: [id number]  Discharge Plan: SNF    Current Diagnoses: Patient Active Problem List   Diagnosis Date Noted  . Chest pain 07/06/2018  . Bradycardia 07/06/2018  . Pneumothorax, left 10/13/2017  . Multiple lung nodules on CT 10/06/2017  . Pleural effusion, left 10/06/2017  . Diabetic polyneuropathy associated with type 2 diabetes mellitus ([place]) 07/30/2017  . Onychomycosis of toenail 07/30/2017  . Idiopathic chronic venous hypertension of both lower extremities with inflammation 07/30/2017  . Chronic respiratory failure with hypoxia ([place]) 04/29/2017  . Morbid (severe) obesity due to excess calories ([place]) 04/29/2017  . LFT elevation   . Advance care planning   . Goals of care, counseling/discussion   . Palliative care by specialist   . Pressure ulcer 01/19/2017  . Long term current use of amiodarone 10/17/2016  . History of pressure ulcer 10/17/2016  . GAD (generalized anxiety disorder) 10/17/2016  . Osteopenia determined by x-ray 04/17/2016  . Controlled type 2 diabetes mellitus with diabetic polyneuropathy, with long-term current use of insulin ([place]) 04/17/2016  . Type 2 diabetes mellitus with diabetic neuropathy ([place]) 06/08/2015  . Type 2 diabetes, controlled, with renal manifestation ([place]) 06/08/2015  . PVD (peripheral vascular  disease) ([place]) 06/08/2015  . Swelling of limb-Left arm 04/10/2015  . Nausea with vomiting 01/12/2015  . AP (abdominal pain) 01/12/2015  . Normocytic anemia 01/12/2015  . Paroxysmal atrial fibrillation ([place]) 12/22/2014  . Melena   . ESRD on dialysis ([place]) 12/19/2014  . CHF (congestive heart failure) ([place]) 12/19/2014  . Palpitation 12/19/2014  . Anemia, chronic disease 12/13/2014  . Hypotension 12/13/2014  . Pulmonary edema 11/10/2014  . Physical deconditioning 10/29/2014  . Iron deficiency anemia   . Gastritis and gastroduodenitis 10/24/2014  . Heme positive stool 10/24/2014  . Morbid obesity ([place]) 10/18/2014  . Edema 10/18/2014  . Essential hypertension 10/17/2014  . DM type 2, uncontrolled, with renal complications ([place]) 90/30/0923  . Dyslipidemia 10/17/2014  . Anemia 10/17/2014    Orientation RESPIRATION BLADDER Height & Weight     Self, Time, Situation, Place  O2(nasal cannual 3L) Continent, External catheter Weight: 152 lb (68.9 kg) Height:  5\' 5"  (165.1 cm)  BEHAVIORAL SYMPTOMS/MOOD NEUROLOGICAL BOWEL NUTRITION STATUS      Continent Diet(please see DC summary)  AMBULATORY STATUS COMMUNICATION OF NEEDS Skin   (baseline) Verbally Other (Comment)(redness on bottom)                       Personal Care Assistance Level of Assistance  Bathing, Feeding, Dressing(baseline)           Functional Limitations Info  Sight, Hearing, Speech Sight Info: Adequate Hearing Info: Adequate Speech Info: Adequate    SPECIAL CARE FACTORS FREQUENCY                       Contractures Contractures  Info: Not present    Additional Factors Info  Code Status, Allergies, Isolation Precautions Code Status Info: Full Allergies Info: Erythromycin, Codeine, Erythromycin Base, Penicillin G, Penicillins, Zantac Ranitidine Hcl     Isolation Precautions Info: Contact precautions, MRSA     Current Medications (07/07/2018):  This is the current hospital active medication  list Current Facility-Administered Medications  Medication Dose Route Frequency Provider Last Rate Last Dose  . acetaminophen (TYLENOL) tablet 650 mg  650 mg Oral Q4H PRN Fuller Plan A, MD      . allopurinol (ZYLOPRIM) tablet 100 mg  100 mg Oral Daily Smith, Rondell A, MD   100 mg at 07/07/18 1002  . aspirin chewable tablet 81 mg  81 mg Oral Daily Smith, Rondell A, MD   81 mg at 07/07/18 1002  . barrier cream (non-specified) 1 application  1 application Topical BID Norval Morton, MD   1 application at 14/43/15 1002  . Chlorhexidine Gluconate Cloth 2 % PADS 6 each  6 each Topical Q0600 Florencia Reasons, MD   6 each at 07/07/18 [phone]  . diazepam (VALIUM) tablet 5 mg  5 mg Oral QHS Smith, Rondell A, MD   5 mg at 07/06/18 2115  . dorzolamide-timolol (COSOPT) 22.3-6.8 MG/ML ophthalmic solution 1 drop  1 drop Left Eye BID Fuller Plan A, MD   1 drop at 07/07/18 1003  . feeding supplement (NEPRO CARB STEADY) liquid 237 mL  237 mL Oral BID BM Florencia Reasons, MD   237 mL at 07/06/18 1842  . fentaNYL (SUBLIMAZE) injection 12.5 mcg  12.5 mcg Intravenous Q2H PRN Smith, Rondell A, MD      . fluconazole (DIFLUCAN) tablet 150 mg  150 mg Oral Q M,W,F-HD Tamala Julian, Rondell A, MD   150 mg at 07/07/18 1002  . gi cocktail (Maalox,Lidocaine,Donnatal)  30 mL Oral QID PRN Fuller Plan A, MD      . heparin injection 5,000 Units  5,000 Units Subcutaneous Q8H Fuller Plan A, MD   5,000 Units at 07/07/18 [phone]  . hydroxypropyl methylcellulose / hypromellose (ISOPTO TEARS / GONIOVISC) 2.5 % ophthalmic solution 1 drop  1 drop Both Eyes TID Florencia Reasons, MD      . loperamide (IMODIUM) capsule 2 mg  2 mg Oral PRN Arrien, Jimmy Picket, MD      . midodrine (PROAMATINE) tablet 10 mg  10 mg Oral Q M,W,F Smith, Rondell A, MD   10 mg at 07/07/18 1002  . mupirocin ointment (BACTROBAN) 2 % 1 application  1 application Nasal BID Florencia Reasons, MD   1 application at 95/09/32 1002  . ondansetron (ZOFRAN) injection 4 mg  4 mg Intravenous Q6H PRN Smith,  Rondell A, MD      . oxyCODONE (Oxy IR/ROXICODONE) immediate release tablet 5 mg  5 mg Oral Q4H PRN Smith, Rondell A, MD      . promethazine (PHENERGAN) tablet 25 mg  25 mg Oral Q8H PRN Norval Morton, MD         Discharge Medications: Please see discharge summary for a list of discharge medications.  Relevant Imaging Results:  Relevant Lab Results:   Additional Information SSN: [id number]; HD MWF   Estanislado Emms, LCSW

## 2018-07-08 DIAGNOSIS — I10 Essential (primary) hypertension: Secondary | ICD-10-CM | POA: Diagnosis not present

## 2018-07-08 DIAGNOSIS — E119 Type 2 diabetes mellitus without complications: Secondary | ICD-10-CM | POA: Diagnosis not present

## 2018-07-08 DIAGNOSIS — G4700 Insomnia, unspecified: Secondary | ICD-10-CM | POA: Diagnosis not present

## 2018-07-08 DIAGNOSIS — H00019 Hordeolum externum unspecified eye, unspecified eyelid: Secondary | ICD-10-CM | POA: Diagnosis not present

## 2018-07-08 DIAGNOSIS — K219 Gastro-esophageal reflux disease without esophagitis: Secondary | ICD-10-CM | POA: Diagnosis present

## 2018-07-08 DIAGNOSIS — R531 Weakness: Secondary | ICD-10-CM | POA: Diagnosis not present

## 2018-07-08 DIAGNOSIS — Z833 Family history of diabetes mellitus: Secondary | ICD-10-CM | POA: Diagnosis not present

## 2018-07-08 DIAGNOSIS — E1129 Type 2 diabetes mellitus with other diabetic kidney complication: Secondary | ICD-10-CM | POA: Diagnosis not present

## 2018-07-08 DIAGNOSIS — Z9013 Acquired absence of bilateral breasts and nipples: Secondary | ICD-10-CM | POA: Diagnosis not present

## 2018-07-08 DIAGNOSIS — N186 End stage renal disease: Secondary | ICD-10-CM | POA: Diagnosis not present

## 2018-07-08 DIAGNOSIS — I5032 Chronic diastolic (congestive) heart failure: Secondary | ICD-10-CM | POA: Diagnosis not present

## 2018-07-08 DIAGNOSIS — M109 Gout, unspecified: Secondary | ICD-10-CM | POA: Diagnosis present

## 2018-07-08 DIAGNOSIS — R609 Edema, unspecified: Secondary | ICD-10-CM | POA: Diagnosis not present

## 2018-07-08 DIAGNOSIS — I495 Sick sinus syndrome: Secondary | ICD-10-CM | POA: Diagnosis present

## 2018-07-08 DIAGNOSIS — D509 Iron deficiency anemia, unspecified: Secondary | ICD-10-CM | POA: Diagnosis not present

## 2018-07-08 DIAGNOSIS — E1151 Type 2 diabetes mellitus with diabetic peripheral angiopathy without gangrene: Secondary | ICD-10-CM | POA: Diagnosis not present

## 2018-07-08 DIAGNOSIS — M6281 Muscle weakness (generalized): Secondary | ICD-10-CM | POA: Diagnosis not present

## 2018-07-08 DIAGNOSIS — R498 Other voice and resonance disorders: Secondary | ICD-10-CM | POA: Diagnosis not present

## 2018-07-08 DIAGNOSIS — N2581 Secondary hyperparathyroidism of renal origin: Secondary | ICD-10-CM | POA: Diagnosis not present

## 2018-07-08 DIAGNOSIS — E1165 Type 2 diabetes mellitus with hyperglycemia: Secondary | ICD-10-CM | POA: Diagnosis not present

## 2018-07-08 DIAGNOSIS — Z853 Personal history of malignant neoplasm of breast: Secondary | ICD-10-CM | POA: Diagnosis not present

## 2018-07-08 DIAGNOSIS — Z9981 Dependence on supplemental oxygen: Secondary | ICD-10-CM | POA: Diagnosis not present

## 2018-07-08 DIAGNOSIS — J45901 Unspecified asthma with (acute) exacerbation: Secondary | ICD-10-CM | POA: Diagnosis present

## 2018-07-08 DIAGNOSIS — Z9841 Cataract extraction status, right eye: Secondary | ICD-10-CM | POA: Diagnosis not present

## 2018-07-08 DIAGNOSIS — I132 Hypertensive heart and chronic kidney disease with heart failure and with stage 5 chronic kidney disease, or end stage renal disease: Secondary | ICD-10-CM | POA: Diagnosis not present

## 2018-07-08 DIAGNOSIS — I953 Hypotension of hemodialysis: Secondary | ICD-10-CM | POA: Diagnosis not present

## 2018-07-08 DIAGNOSIS — Z7189 Other specified counseling: Secondary | ICD-10-CM | POA: Diagnosis not present

## 2018-07-08 DIAGNOSIS — E1122 Type 2 diabetes mellitus with diabetic chronic kidney disease: Secondary | ICD-10-CM | POA: Diagnosis not present

## 2018-07-08 DIAGNOSIS — E785 Hyperlipidemia, unspecified: Secondary | ICD-10-CM | POA: Diagnosis present

## 2018-07-08 DIAGNOSIS — R072 Precordial pain: Secondary | ICD-10-CM | POA: Diagnosis not present

## 2018-07-08 DIAGNOSIS — R001 Bradycardia, unspecified: Secondary | ICD-10-CM | POA: Diagnosis not present

## 2018-07-08 DIAGNOSIS — E211 Secondary hyperparathyroidism, not elsewhere classified: Secondary | ICD-10-CM | POA: Diagnosis not present

## 2018-07-08 DIAGNOSIS — F419 Anxiety disorder, unspecified: Secondary | ICD-10-CM | POA: Diagnosis not present

## 2018-07-08 DIAGNOSIS — I120 Hypertensive chronic kidney disease with stage 5 chronic kidney disease or end stage renal disease: Secondary | ICD-10-CM | POA: Diagnosis not present

## 2018-07-08 DIAGNOSIS — Z23 Encounter for immunization: Secondary | ICD-10-CM | POA: Diagnosis not present

## 2018-07-08 DIAGNOSIS — I959 Hypotension, unspecified: Secondary | ICD-10-CM | POA: Diagnosis not present

## 2018-07-08 DIAGNOSIS — M8580 Other specified disorders of bone density and structure, unspecified site: Secondary | ICD-10-CM | POA: Diagnosis present

## 2018-07-08 DIAGNOSIS — J9611 Chronic respiratory failure with hypoxia: Secondary | ICD-10-CM | POA: Diagnosis not present

## 2018-07-08 DIAGNOSIS — Z7982 Long term (current) use of aspirin: Secondary | ICD-10-CM | POA: Diagnosis not present

## 2018-07-08 DIAGNOSIS — Z7401 Bed confinement status: Secondary | ICD-10-CM | POA: Diagnosis not present

## 2018-07-08 DIAGNOSIS — I480 Paroxysmal atrial fibrillation: Secondary | ICD-10-CM | POA: Diagnosis not present

## 2018-07-08 DIAGNOSIS — Z515 Encounter for palliative care: Secondary | ICD-10-CM | POA: Diagnosis not present

## 2018-07-08 DIAGNOSIS — T82898A Other specified complication of vascular prosthetic devices, implants and grafts, initial encounter: Secondary | ICD-10-CM | POA: Diagnosis not present

## 2018-07-08 DIAGNOSIS — D631 Anemia in chronic kidney disease: Secondary | ICD-10-CM | POA: Diagnosis not present

## 2018-07-08 DIAGNOSIS — T82590A Other mechanical complication of surgically created arteriovenous fistula, initial encounter: Secondary | ICD-10-CM | POA: Diagnosis present

## 2018-07-08 DIAGNOSIS — R079 Chest pain, unspecified: Secondary | ICD-10-CM | POA: Diagnosis not present

## 2018-07-08 DIAGNOSIS — Z9049 Acquired absence of other specified parts of digestive tract: Secondary | ICD-10-CM | POA: Diagnosis not present

## 2018-07-08 DIAGNOSIS — L0390 Cellulitis, unspecified: Secondary | ICD-10-CM | POA: Diagnosis not present

## 2018-07-08 DIAGNOSIS — E1140 Type 2 diabetes mellitus with diabetic neuropathy, unspecified: Secondary | ICD-10-CM | POA: Diagnosis not present

## 2018-07-08 DIAGNOSIS — M2550 Pain in unspecified joint: Secondary | ICD-10-CM | POA: Diagnosis not present

## 2018-07-08 DIAGNOSIS — R293 Abnormal posture: Secondary | ICD-10-CM | POA: Diagnosis not present

## 2018-07-08 DIAGNOSIS — D649 Anemia, unspecified: Secondary | ICD-10-CM | POA: Diagnosis not present

## 2018-07-08 DIAGNOSIS — Z992 Dependence on renal dialysis: Secondary | ICD-10-CM | POA: Diagnosis not present

## 2018-07-08 LAB — BASIC METABOLIC PANEL
Anion gap: 11 (ref 5–15)
BUN: 18 mg/dL (ref 8–23)
CALCIUM: 7.9 mg/dL — AB (ref 8.9–10.3)
CO2: 29 mmol/L (ref 22–32)
CREATININE: 2.58 mg/dL — AB (ref 0.44–1.00)
Chloride: 97 mmol/L — ABNORMAL LOW (ref 98–111)
GFR calc Af Amer: 19 mL/min — ABNORMAL LOW (ref >60)
GFR, EST NON AFRICAN AMERICAN: 16 mL/min — AB (ref >60)
GLUCOSE: 117 mg/dL — AB (ref 70–99)
POTASSIUM: 3.8 mmol/L (ref 3.5–5.1)
Sodium: 137 mmol/L (ref 135–145)

## 2018-07-08 LAB — GLUCOSE, CAPILLARY: GLUCOSE-CAPILLARY: 101 mg/dL — AB (ref 70–99)

## 2018-07-08 MED ORDER — DIAZEPAM 5 MG PO TABS: 5.0000 mg | ORAL_TABLET | Freq: Every day | ORAL | 0 refills | Status: DC

## 2018-07-08 MED ORDER — CALCIUM ACETATE (PHOS BINDER) 667 MG PO CAPS: 1334.0000 mg | ORAL_CAPSULE | Freq: Three times a day (TID) | ORAL | 0 refills | Status: AC

## 2018-07-08 MED ORDER — OXYCODONE HCL 5 MG PO TABS: 5.0000 mg | ORAL_TABLET | ORAL | 0 refills | Status: DC | PRN

## 2018-07-08 MED ORDER — CLOTRIMAZOLE 2 % VA CREA
1.0000 | TOPICAL_CREAM | Freq: Every day | VAGINAL | Status: DC
  Filled 2018-07-08: qty 21

## 2018-07-08 NOTE — Clinical Social Work Placement (Signed)
   CLINICAL SOCIAL WORK PLACEMENT  NOTE  Date:  07/08/2018  Patient Details  Name: AKIA MONTALBAN MRN: [id number] Date of Birth: 02/24/1936  Clinical Social Work is seeking post-discharge placement for this patient at the [place] level of care (*CSW will initial, date and re-position this form in  chart as items are completed):  Yes   Patient/family provided with [place] Work Department's list of facilities offering this level of care within the geographic area requested by the patient (or if unable, by the patient's family).  Yes   Patient/family informed of their freedom to choose among providers that offer the needed level of care, that participate in Medicare, Medicaid or managed care program needed by the patient, have an available bed and are willing to accept the patient.  Yes   Patient/family informed of [facility]'s ownership interest in [facility] and [facility], as well as of the fact that they are under no obligation to receive care at these facilities.  PASRR submitted to EDS on       PASRR number received on       Existing PASRR number confirmed on 07/07/18     FL2 transmitted to all facilities in geographic area requested by pt/family on 07/07/18     FL2 transmitted to all facilities within larger geographic area on       Patient informed that his/her managed care company has contracts with or will negotiate with certain facilities, including the following:  [employer]     Yes   Patient/family informed of bed offers received.  Patient chooses bed at [facility]     Physician recommends and patient chooses bed at      Patient to be transferred to [facility] on 07/08/18.  Patient to be transferred to facility by [facility]     Patient family notified on 07/08/18 of transfer.  Name of family member notified:        PHYSICIAN Please prepare priority discharge summary, including medications, Please prepare  prescriptions     Additional Comment:    _______________________________________________ Estanislado Emms, LCSW 07/08/2018, 9:57 AM

## 2018-07-08 NOTE — Discharge Summary (Signed)
Physician Discharge Summary  [name] James LHT:[id number] DOB: 1936-09-11 DOA: 07/05/2018  PCP: Patrick Jupiter  Admit date: 07/05/2018 Discharge date: 07/08/2018  Admitted From: SNF  Disposition: SNF   Recommendations for Outpatient Follow-up and new medication changes:  1. Follow up with [facility] in 7 days 2. Amiodarone has been held due to bradycardia 3. Continue to hold metoprolol and warfarin 4. Plan for hemodialysis on 07/09/2018 5. Calcium acetate has been added to her medical regimen.   Home Health: na  Equipment/Devices: no    Discharge Condition: Stable  CODE STATUS: full  Diet recommendation: Heart healthy and diabetic/ renal prudent.   Brief/Interim Summary: 82 year old female who presented with chest pressure.  She does have the significant past medical history for end-stage renal disease on hemodialysis, (M/W/F), hypertension, type 2 diabetes mellitus, paroxysmal atrial fibrillation and chronic hypoxic respiratory failure.  Nonambulatory at baseline.  Reported chest pressure, radiated to her neck, associated with dyspnea, chronic abdominal pain and nausea.  Patient was taken off warfarin and metoprolol July 7.  Still taking amiodarone.  On initial physical examination blood pressure 102/25, heart rate 44, respiratory rate 13, oxygen saturation 100%.  Lungs clear to auscultation bilaterally, heart S1-S2 present, bradycardic, no gallops, rubs or murmurs, the abdomen was protuberant, nontender, nondistended, no lower extremity edema.  Sodium 140, potassium 4.6, chloride 103, bicarb 25, glucose 154, BUN 23, creatinine 3.21, troponin < 0.03, white count 10.4, hemoglobin 10.5, hematocrit 35.5, platelets 242.  Chest x-ray with a chronic left pleural effusion.  EKG with junctional rhythm, 46 bpm, normal axis.  Patient was admitted to the hospital with a working diagnosis of atypical chest pain, rule out acute coronary syndrome, complicated by junctional bradycardia.  1. Atypical  chest pain.  Patient was admitted to the stepdown unit, serial cardiac enzymes were negative, no further chest pain, further work-up with echocardiography showed a normal LV systolic function 65 to 57% with no wall motion abnormalities.  Patient rule out for acute coronary syndrome.   2.  Junctional bradycardia in the setting of paroxysmal atrial fibrillation.  Medication induced, amiodarone was held, with improvement of her heart rate.  She returned to sinus rhythm, with a heart rate in the high 50s low 60s, with stable blood pressure.  Patient was seen by electrophysiology, with recommendations to continue to avoid AV blockade, close monitoring of heart rate.  Likely patient not candidate for invasive device, due to high risk of infection and difficulty with access given her end-stage renal disease and dialysis access difficulties.   3.  Type 2 diabetes mellitus.  Patient was placed on insulin sliding scale for glucose coverage and monitoring.  Capillary glucose remained well controlled, continue diet control.  4.  End-stage renal disease on hemodialysis (M-W-F).  Patient underwent hemodialysis with no major complications, she does have a new right forearm AV graft which is not mature yet.  Current hemodialysis access is a left upper extremity graft, which has a outflow stenosis (subclavian vein).  Follow by nephrology and vascular surgery as an outpatient, next hemodialysis July 19. Continue midodrine for blood pressure on hemodialysis days.   5. Anxiety.  Will continue diazepam per home regimen.  Discharge Diagnoses:  Principal Problem:   Chest pain Active Problems:   Hypotension   ESRD on dialysis [facility])   Paroxysmal atrial fibrillation (HCC)   Type 2 diabetes mellitus with diabetic neuropathy (HCC)   Bradycardia    Discharge Instructions   Allergies as of 07/08/2018  Reactions   Erythromycin Swelling   Codeine Palpitations, Rash, Other (See Comments)   GI Upset   Erythromycin  Base Other (See Comments)   GI Upset   Penicillin G Rash   Has patient had a PCN reaction causing immediate rash, facial/tongue/throat swelling, SOB or lightheadedness with hypotension: Yes Has patient had a PCN reaction causing severe rash involving mucus membranes or skin necrosis: Unk Has patient had a PCN reaction that required hospitalization: Unk Has patient had a PCN reaction occurring within the last 10 years: Unk If all of the above answers are "NO", then may proceed with Cephalosporin use.Marland Kitchen   Penicillins Rash, Other (See Comments)   Has taken Keflex & Rocephin many times without problems Has patient had a PCN reaction causing immediate rash, facial/tongue/throat swelling, SOB or lightheadedness with hypotension: Yes Has patient had a PCN reaction causing severe rash involving mucus membranes or skin necrosis: Unk Has patient had a PCN reaction that required hospitalization: Unk Has patient had a PCN reaction occurring within the last 10 years: Unk If all of the above answers are "NO", then may proceed with Cephalosporin use.   Zantac [ranitidine Hcl] Nausea And Vomiting      Medication List    STOP taking these medications   amiodarone 200 MG tablet Commonly known as:  PACERONE   FLONASE ALLERGY RELIEF 50 MCG/ACT nasal spray Generic drug:  fluticasone   Gerhardt's butt cream Crea   metoprolol tartrate 25 MG tablet Commonly known as:  LOPRESSOR   warfarin 4 MG tablet Commonly known as:  COUMADIN     TAKE these medications   acetaminophen 325 MG tablet Commonly known as:  TYLENOL Take 650 mg by mouth every 6 (six) hours as needed for fever ("for vomiting/nausea," per [facility]).   allopurinol 100 MG tablet Commonly known as:  ZYLOPRIM Take 100 mg by mouth daily.   aspirin 81 MG chewable tablet Chew 81 mg by mouth daily.   calcium acetate 667 MG capsule Commonly known as:  PHOSLO Take 2 capsules (1,334 mg total) by mouth 3 (three) times daily with meals.    diazepam 5 MG tablet Commonly known as:  VALIUM Take 1 tablet (5 mg total) by mouth at bedtime.   DIMETHICONE (TOPICAL) 5 % Crea Apply 1 application topically See admin instructions. Place a nickel-sized amount onto buttock(s) area 2 times a day for barrier protection   dorzolamide-timolol 22.3-6.8 MG/ML ophthalmic solution Commonly known as:  COSOPT Place 1 drop into the left eye 2 (two) times daily. Wait 2-5 minutes when giving two eye meds   fluconazole 150 MG tablet Commonly known as:  DIFLUCAN Take 150 mg by mouth See admin instructions. Take 150 mg by mouth once a day on dialysis days only for 7 days for vaginal candidiasis   loperamide 2 MG tablet Commonly known as:  IMODIUM A-D Take 2 mg by mouth 3 (three) times daily as needed (for diarrhea).   loratadine 10 MG tablet Commonly known as:  CLARITIN Take 10 mg by mouth daily.   Melatonin 5 MG Tabs Take 5 mg by mouth at bedtime.   midodrine 10 MG tablet Commonly known as:  PROAMATINE Take 10 mg by mouth See admin instructions. Take 10 mg by mouth WITH MORNING MEDS AT FACILITY on Mon/Wed/Fri- dialysis days What changed:  Another medication with the same name was removed. Continue taking this medication, and follow the directions you see here.   NEPHRO VITAMINS 0.8 MG Tabs Take 1 tablet by mouth  daily.   oxyCODONE 5 MG immediate release tablet Commonly known as:  ROXICODONE Take 1 tablet (5 mg total) by mouth every 4 (four) hours as needed for severe pain. What changed:  reasons to take this   OXYGEN Inhale 2 L into the lungs continuous.   promethazine 25 MG tablet Commonly known as:  PHENERGAN Take 25 mg by mouth every 8 (eight) hours as needed for nausea or vomiting.   SYSTANE ULTRA 0.4-0.3 % Soln Generic drug:  Polyethyl Glycol-Propyl Glycol Place 1 drop into both eyes 3 (three) times daily.       Allergies  Allergen Reactions  . Erythromycin Swelling  . Codeine Palpitations, Rash and Other (See  Comments)    GI Upset  . Erythromycin Base Other (See Comments)    GI Upset  . Penicillin G Rash    Has patient had a PCN reaction causing immediate rash, facial/tongue/throat swelling, SOB or lightheadedness with hypotension: Yes Has patient had a PCN reaction causing severe rash involving mucus membranes or skin necrosis: Unk Has patient had a PCN reaction that required hospitalization: Unk Has patient had a PCN reaction occurring within the last 10 years: Unk If all of the above answers are "NO", then may proceed with Cephalosporin use..  . Penicillins Rash and Other (See Comments)    Has taken Keflex & Rocephin many times without problems Has patient had a PCN reaction causing immediate rash, facial/tongue/throat swelling, SOB or lightheadedness with hypotension: Yes Has patient had a PCN reaction causing severe rash involving mucus membranes or skin necrosis: Unk Has patient had a PCN reaction that required hospitalization: Unk Has patient had a PCN reaction occurring within the last 10 years: Unk If all of the above answers are "NO", then may proceed with Cephalosporin use.   Keturah Shavers [Ranitidine Hcl] Nausea And Vomiting    Consultations:  Nephrology   Cardiology (EP)   Procedures/Studies: Dg Chest 2 View  Result Date: 07/05/2018 CLINICAL DATA:  Chest pain starting during dialysis radiating to the back EXAM: CHEST - 2 VIEW COMPARISON:  Chest CT from 01/05/2018 FINDINGS: Atherosclerotic calcification of the aortic arch. Blunted left costophrenic angle with left basilar opacity favoring pleural effusion with passive atelectasis. Right lung appears clear. The pulmonary nodule seen on prior chest CT are not readily apparent on conventional radiography today. Heart size within normal limits. Thoracic spondylosis. IMPRESSION: 1. Heart size within normal limits. 2. Stable left pleural effusion with passive atelectasis. 3. Thoracic spondylosis. 4.  Atherosclerotic calcification of the  aortic arch. 5. Conventional radiography does not exclude the possibility of aortic dissection. Electronically Signed   By: Van Clines M.D.   On: 07/05/2018 20:53       Subjective: Patient is feeling better, no further chest pain, no nausea or vomiting. Positive left upper extremity edema, non tender.   Discharge Exam: Vitals:   07/08/18 0437 07/08/18 0833  BP: (!) 129/50 (!) 88/36  Pulse: (!) 57 60  Resp: 15 15  Temp: 97.9 F (36.6 C)   SpO2: 100% 100%   Vitals:   07/07/18 1935 07/07/18 2336 07/08/18 0437 07/08/18 0833  BP: (!) 145/65 (!) 163/70 (!) 129/50 (!) 88/36  Pulse: 65 63 (!) 57 60  Resp: 16 18 15 15   Temp: 98.2 F (36.8 C) 98.5 F (36.9 C) 97.9 F (36.6 C)   TempSrc: Oral Oral Oral   SpO2: 100% 100% 100% 100%  Weight:   72 kg (158 lb 11.7 oz)   Height:  General: Not in pain or dyspnea, deconditioned  Neurology: Awake and alert, non focal  E QQV:ZDGL pallor, no icterus, oral mucosa moist Cardiovascular: No JVD. S1-S2 present, rhythmic, no gallops, rubs, or murmurs. No lower extremity edema. Pulmonary: decreased breath sounds bilaterally, adequate air movement, no wheezing, rhonchi or rales. Gastrointestinal. Abdomen with no organomegaly, non tender, no rebound or guarding Skin. No rashes Musculoskeletal: no joint deformities  - The results of significant diagnostics from this hospitalization (including imaging, microbiology, ancillary and laboratory) are listed below for reference.     Microbiology: Recent Results (from the past 240 hour(s))  MRSA PCR Screening     Status: Abnormal   Collection Time: 07/06/18  2:37 PM  Result Value Ref Range Status   MRSA by PCR POSITIVE (A) NEGATIVE Final    Comment:        The GeneXpert MRSA Assay (FDA approved for NASAL specimens only), is one component of a comprehensive MRSA colonization surveillance program. It is not intended to diagnose MRSA infection nor to guide or monitor treatment  for MRSA infections. RESULT CALLED TO, READ BACK BY AND VERIFIED WITH: Jadene Pierini RN 18:00 07/06/18 ([email or web address]) Performed at [facility], [place] [address], [place], [place] [address]      Labs: BNP (last 3 results) No results for input(s): BNP in the last 8760 hours. Basic Metabolic Panel: Recent Labs  Lab 07/05/18 2129 07/06/18 0428 07/07/18 1521 07/08/18 0320  NA 139 140 136 137  K 4.4 4.6 4.2 3.8  CL 100 103 100 97*  CO2 28 25 25 29   GLUCOSE 163* 154* 149* 117*  BUN 18 23 38* 18  CREATININE 2.82* 3.21* 4.36* 2.58*  CALCIUM 8.2* 7.9* 7.8* 7.9*  PHOS  --   --  4.5  --    Liver Function Tests: Recent Labs  Lab 07/07/18 1521  ALBUMIN 2.6*   No results for input(s): LIPASE, AMYLASE in the last 168 hours. No results for input(s): AMMONIA in the last 168 hours. CBC: Recent Labs  Lab 07/05/18 2129 07/06/18 0428 07/07/18 1521  WBC 9.8 10.4 7.4  NEUTROABS  --  5.0  --   HGB 11.7* 10.5* 9.6*  HCT 37.9 35.5* 31.3*  MCV 105.9* 107.9* 107.2*  PLT 259 242 179   Cardiac Enzymes: Recent Labs  Lab 07/06/18 0117 07/06/18 0428 07/06/18 0804  TROPONINI <0.03 <0.03 <0.03   BNP: Invalid input(s): POCBNP CBG: Recent Labs  Lab 07/06/18 2057 07/07/18 0728 07/07/18 1151 07/07/18 2119 07/08/18 0828  GLUCAP 153* 137* 163* 114* 101*   D-Dimer No results for input(s): DDIMER in the last 72 hours. Hgb A1c No results for input(s): HGBA1C in the last 72 hours. Lipid Profile No results for input(s): CHOL, HDL, LDLCALC, TRIG, CHOLHDL, LDLDIRECT in the last 72 hours. Thyroid function studies Recent Labs    07/06/18 0115  TSH 2.099   Anemia work up No results for input(s): VITAMINB12, FOLATE, FERRITIN, TIBC, IRON, RETICCTPCT in the last 72 hours. Urinalysis    Component Value Date/Time   COLORURINE YELLOW 10/26/2014 2043   APPEARANCEUR CLOUDY (A) 10/26/2014 2043   LABSPEC 1.011 10/26/2014 2043   PHURINE 5.0 10/26/2014 2043   GLUCOSEU 100 (A) 10/26/2014 2043    HGBUR SMALL (A) 10/26/2014 2043   BILIRUBINUR NEGATIVE 10/26/2014 2043   BILIRUBINUR neg 04/02/2014 0848   KETONESUR NEGATIVE 10/26/2014 2043   PROTEINUR 100 (A) 10/26/2014 2043   UROBILINOGEN 0.2 10/26/2014 2043   NITRITE POSITIVE (A) 10/26/2014 2043   LEUKOCYTESUR MODERATE (  A) 10/26/2014 2043   Sepsis Labs Invalid input(s): PROCALCITONIN,  WBC,  LACTICIDVEN Microbiology Recent Results (from the past 240 hour(s))  MRSA PCR Screening     Status: Abnormal   Collection Time: 07/06/18  2:37 PM  Result Value Ref Range Status   MRSA by PCR POSITIVE (A) NEGATIVE Final    Comment:        The GeneXpert MRSA Assay (FDA approved for NASAL specimens only), is one component of a comprehensive MRSA colonization surveillance program. It is not intended to diagnose MRSA infection nor to guide or monitor treatment for MRSA infections. RESULT CALLED TO, READ BACK BY AND VERIFIED WITH: Jadene Pierini RN 18:00 07/06/18 ([email or web address]) Performed at [facility], [place] [address] [place], [place] [address]      Time coordinating discharge: 45 minutes  SIGNED:   Tawni Millers, MD  [facility] 07/08/2018, 9:15 AM Pager [phone]  If 7PM-7AM, please contact night-coverage [email or web address] Password [email or web address]

## 2018-07-08 NOTE — Progress Notes (Addendum)
Patient will discharge back to [facility]. Anticipated discharge date: 07/08/18 Family notified: Coralyn Mark, daughter (left voicemail) Transportation by: [facility]  Nurse to call report to [phone]. Patient will go to room 205A at the facility.  CSW signing off.  Estanislado Emms, [place]  Clinical Social Worker

## 2018-07-08 NOTE — Progress Notes (Signed)
Report was given to a nurse at [facility] place. Pt was picked up by [facility].  Belongings were sent with them.  Idolina Primer, RN

## 2018-07-08 NOTE — Plan of Care (Signed)

## 2018-07-08 NOTE — Progress Notes (Addendum)
[facility] Progress Note   Subjective:   Feeling ok today. Wanting to go home. Reports swelling in left arm, slightly more than normal.  Denies pain currently.   Objective Vitals:   07/07/18 1935 07/07/18 2336 07/08/18 0437 07/08/18 0833  BP: (!) 145/65 (!) 163/70 (!) 129/50 (!) 88/36  Pulse: 65 63 (!) 57 60  Resp: 16 18 15 15   Temp: 98.2 F (36.8 C) 98.5 F (36.9 C) 97.9 F (36.6 C)   TempSrc: Oral Oral Oral   SpO2: 100% 100% 100% 100%  Weight:   72 kg (158 lb 11.7 oz)   Height:       Physical Exam General:NAD, pale, obese, chronically ill appearing female Heart:RRR Lungs:CTAB anteriorly Extremities:trace LE edema b/l, 1-2+ LU extremity edema from digits to shoulder Dialysis Access: LU AVG +bruit, RU AVG +b  Filed Weights   07/07/18 1415 07/07/18 1900 07/08/18 0437  Weight: 73.8 kg (162 lb 11.2 oz) 71.3 kg (157 lb 3 oz) 72 kg (158 lb 11.7 oz)    Intake/Output Summary (Last 24 hours) at 07/08/2018 0936 Last data filed at 07/07/2018 1804 Gross per 24 hour  Intake 240 ml  Output 2162 ml  Net -1922 ml    Additional Objective Labs: Basic Metabolic Panel: Recent Labs  Lab 07/06/18 0428 07/07/18 1521 07/08/18 0320  NA 140 136 137  K 4.6 4.2 3.8  CL 103 100 97*  CO2 25 25 29   GLUCOSE 154* 149* 117*  BUN 23 38* 18  CREATININE 3.21* 4.36* 2.58*  CALCIUM 7.9* 7.8* 7.9*  PHOS  --  4.5  --    Liver Function Tests: Recent Labs  Lab 07/07/18 1521  ALBUMIN 2.6*   CBC: Recent Labs  Lab 07/05/18 2129 07/06/18 0428 07/07/18 1521  WBC 9.8 10.4 7.4  NEUTROABS  --  5.0  --   HGB 11.7* 10.5* 9.6*  HCT 37.9 35.5* 31.3*  MCV 105.9* 107.9* 107.2*  PLT 259 242 179   Blood Culture    Component Value Date/Time   SDES FLUID PLEURAL LEFT 10/13/2017 1325   SDES FLUID PLEURAL LEFT 10/13/2017 1325   SPECREQUEST BOTTLES DRAWN AEROBIC AND ANAEROBIC 10/13/2017 1325   SPECREQUEST NONE 10/13/2017 1325   CULT  10/13/2017 1325    NO GROWTH 5  DAYS Performed at [facility], [place] [address] [place], [place] [address]    REPTSTATUS 10/18/2017 FINAL 10/13/2017 1325   REPTSTATUS 10/14/2017 FINAL 10/13/2017 1325    Cardiac Enzymes: Recent Labs  Lab 07/06/18 0117 07/06/18 0428 07/06/18 0804  TROPONINI <0.03 <0.03 <0.03   CBG: Recent Labs  Lab 07/06/18 2057 07/07/18 0728 07/07/18 1151 07/07/18 2119 07/08/18 0828  GLUCAP 153* 137* 163* 114* 101*   Iron Studies: No results for input(s): IRON, TIBC, TRANSFERRIN, FERRITIN in the last 72 hours. Lab Results  Component Value Date   INR 1.03 07/05/2018   INR 1.17 01/19/2017   INR 1.14 12/19/2014   Studies/Results: No results found.  Medications:  . allopurinol  100 mg Oral Daily  . aspirin  81 mg Oral Daily  . barrier cream  1 application Topical BID  . calcium acetate  1,334 mg Oral TID WC  . Chlorhexidine Gluconate Cloth  6 each Topical Q0600  . clotrimazole  1 Applicatorful Vaginal QHS  . diazepam  5 mg Oral QHS  . dorzolamide-timolol  1 drop Left Eye BID  . feeding supplement (NEPRO CARB STEADY)  237 mL Oral BID BM  . fluconazole  150  mg Oral Q M,W,F-HD  . heparin  5,000 Units Subcutaneous Q8H  . hydroxypropyl methylcellulose / hypromellose  1 drop Both Eyes TID  . insulin aspart  0-9 Units Subcutaneous TID WC  . midodrine  10 mg Oral Q M,W,F  . mupirocin ointment  1 application Nasal BID    Dialysis Orders: MWF  - [facility] ([facility])  3.75hrs, BFR 400, DFR 800,  EDW 69.5kg, 3K/ 2Ca  Access: LU AVG, RU AVG placed 06/08/18 Dr. Scot Dock - not ready for use per follow up on 7/10  Heparin Bolus 3300 Unit Mircera 60 mcg q2wks - last 06/28/18 Venofer 50mg  qwk No VDRA   Assessment/Plan: 1.  Chest Pain - resolved. Cardio following 2. Bradycardia/junctional rhythm - d/c amiodarone/metoprolol. Per electrophysiology pacing is not best strategy for now. Avoid all nodal blocking agents, may need to reassess in the future due to A fib. Per cardio 3. Left Arm Edema -  known outflow stenosis of LU AVG.  HD tolerated well yesterday using LU AVG.  Recommended she elevated arm to help reduce swelling.  Per last f/u with [facility], if difficultly with cannulation/dialysis via LU AVG will likely require TDC.  Consulted [facility] to evaluate prior to discharge per patient request.  Will continue to manage further as OP.  4.  ESRD -  MWF HD pt.  HD yesterday tolerated well using LU AVG.  K 3.8.  5.  Hypertension/volume  - BP variable but mostly well controlled.  net UF removed 2.2L.  Does not appear grossly volume overloaded on exam.  Will need to get hoyer weight to better assess edw.  6.  Anemia of CKD - Hgb 10.5>9.6. Follow. 7.  Secondary Hyperparathyroidism -  Ca and phos in goal as OP.  Continue binders. Not on VDRA.  8.  Nutrition - Renal diet with fluid restrictions. Renavite. Nepro. 8.   Chronic respiratory failure - on home O2.  9.   Dispo - Ok to d/c from renal standpoint     Jen Mow, PA-C [facility] Pager: [phone] 07/08/2018,9:36 AM  LOS: 1 day

## 2018-07-08 NOTE — Care Management Important Message (Signed)
Important Message  Patient Details  Name: [name] James MRN: [id number] Date of Birth: [birth year]   Medicare Important Message Given:  Yes    Bethena Roys, RN 07/08/2018, 10:41 AM

## 2018-07-09 DIAGNOSIS — N2581 Secondary hyperparathyroidism of renal origin: Secondary | ICD-10-CM | POA: Diagnosis not present

## 2018-07-09 DIAGNOSIS — E1122 Type 2 diabetes mellitus with diabetic chronic kidney disease: Secondary | ICD-10-CM | POA: Diagnosis not present

## 2018-07-09 DIAGNOSIS — D509 Iron deficiency anemia, unspecified: Secondary | ICD-10-CM | POA: Diagnosis not present

## 2018-07-09 DIAGNOSIS — D631 Anemia in chronic kidney disease: Secondary | ICD-10-CM | POA: Diagnosis not present

## 2018-07-09 DIAGNOSIS — R079 Chest pain, unspecified: Secondary | ICD-10-CM | POA: Diagnosis not present

## 2018-07-09 DIAGNOSIS — N186 End stage renal disease: Secondary | ICD-10-CM | POA: Diagnosis not present

## 2018-07-09 DIAGNOSIS — E119 Type 2 diabetes mellitus without complications: Secondary | ICD-10-CM | POA: Diagnosis not present

## 2018-07-12 DIAGNOSIS — N2581 Secondary hyperparathyroidism of renal origin: Secondary | ICD-10-CM | POA: Diagnosis not present

## 2018-07-12 DIAGNOSIS — N186 End stage renal disease: Secondary | ICD-10-CM | POA: Diagnosis not present

## 2018-07-12 DIAGNOSIS — D631 Anemia in chronic kidney disease: Secondary | ICD-10-CM | POA: Diagnosis not present

## 2018-07-12 DIAGNOSIS — E1122 Type 2 diabetes mellitus with diabetic chronic kidney disease: Secondary | ICD-10-CM | POA: Diagnosis not present

## 2018-07-12 DIAGNOSIS — R609 Edema, unspecified: Secondary | ICD-10-CM | POA: Diagnosis not present

## 2018-07-12 DIAGNOSIS — D509 Iron deficiency anemia, unspecified: Secondary | ICD-10-CM | POA: Diagnosis not present

## 2018-07-12 DIAGNOSIS — I959 Hypotension, unspecified: Secondary | ICD-10-CM | POA: Diagnosis not present

## 2018-07-13 DIAGNOSIS — R609 Edema, unspecified: Secondary | ICD-10-CM | POA: Diagnosis not present

## 2018-07-13 DIAGNOSIS — L0390 Cellulitis, unspecified: Secondary | ICD-10-CM | POA: Diagnosis not present

## 2018-07-14 DIAGNOSIS — N2581 Secondary hyperparathyroidism of renal origin: Secondary | ICD-10-CM | POA: Diagnosis not present

## 2018-07-14 DIAGNOSIS — E1122 Type 2 diabetes mellitus with diabetic chronic kidney disease: Secondary | ICD-10-CM | POA: Diagnosis not present

## 2018-07-14 DIAGNOSIS — D631 Anemia in chronic kidney disease: Secondary | ICD-10-CM | POA: Diagnosis not present

## 2018-07-14 DIAGNOSIS — D509 Iron deficiency anemia, unspecified: Secondary | ICD-10-CM | POA: Diagnosis not present

## 2018-07-14 DIAGNOSIS — N186 End stage renal disease: Secondary | ICD-10-CM | POA: Diagnosis not present

## 2018-07-16 DIAGNOSIS — N2581 Secondary hyperparathyroidism of renal origin: Secondary | ICD-10-CM | POA: Diagnosis not present

## 2018-07-16 DIAGNOSIS — E1122 Type 2 diabetes mellitus with diabetic chronic kidney disease: Secondary | ICD-10-CM | POA: Diagnosis not present

## 2018-07-16 DIAGNOSIS — N186 End stage renal disease: Secondary | ICD-10-CM | POA: Diagnosis not present

## 2018-07-16 DIAGNOSIS — D509 Iron deficiency anemia, unspecified: Secondary | ICD-10-CM | POA: Diagnosis not present

## 2018-07-16 DIAGNOSIS — D631 Anemia in chronic kidney disease: Secondary | ICD-10-CM | POA: Diagnosis not present

## 2018-07-19 DIAGNOSIS — D631 Anemia in chronic kidney disease: Secondary | ICD-10-CM | POA: Diagnosis not present

## 2018-07-19 DIAGNOSIS — D509 Iron deficiency anemia, unspecified: Secondary | ICD-10-CM | POA: Diagnosis not present

## 2018-07-19 DIAGNOSIS — N186 End stage renal disease: Secondary | ICD-10-CM | POA: Diagnosis not present

## 2018-07-19 DIAGNOSIS — E1122 Type 2 diabetes mellitus with diabetic chronic kidney disease: Secondary | ICD-10-CM | POA: Diagnosis not present

## 2018-07-19 DIAGNOSIS — N2581 Secondary hyperparathyroidism of renal origin: Secondary | ICD-10-CM | POA: Diagnosis not present

## 2018-07-21 DIAGNOSIS — D509 Iron deficiency anemia, unspecified: Secondary | ICD-10-CM | POA: Diagnosis not present

## 2018-07-21 DIAGNOSIS — N2581 Secondary hyperparathyroidism of renal origin: Secondary | ICD-10-CM | POA: Diagnosis not present

## 2018-07-21 DIAGNOSIS — E1122 Type 2 diabetes mellitus with diabetic chronic kidney disease: Secondary | ICD-10-CM | POA: Diagnosis not present

## 2018-07-21 DIAGNOSIS — D631 Anemia in chronic kidney disease: Secondary | ICD-10-CM | POA: Diagnosis not present

## 2018-07-21 DIAGNOSIS — N186 End stage renal disease: Secondary | ICD-10-CM | POA: Diagnosis not present

## 2018-07-22 ENCOUNTER — Ambulatory Visit (INDEPENDENT_AMBULATORY_CARE_PROVIDER_SITE_OTHER): Payer: Self-pay | Admitting: Physician Assistant

## 2018-07-22 ENCOUNTER — Other Ambulatory Visit: Payer: Self-pay

## 2018-07-22 VITALS — Resp 16 | Ht 65.0 in | Wt 154.0 lb

## 2018-07-22 DIAGNOSIS — N186 End stage renal disease: Secondary | ICD-10-CM

## 2018-07-22 DIAGNOSIS — Z992 Dependence on renal dialysis: Secondary | ICD-10-CM

## 2018-07-22 DIAGNOSIS — E1122 Type 2 diabetes mellitus with diabetic chronic kidney disease: Secondary | ICD-10-CM | POA: Diagnosis not present

## 2018-07-22 NOTE — Progress Notes (Signed)
    Postoperative Access Visit   History of Present Illness   [name] James is a 82 y.o. year old female who presents for postoperative follow-up for: right forearm arteriovenous graft by Dr. Scot Dock (Date: 06/08/18).  The patient's wounds are healed.  The patient denies steal symptoms.  Edema of RUE has improved from last office visit.  She however has noticed some increase in edema of LUE.  Surgical history significant for L arm AV graft by Dr. Scot Dock 03/2015.  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  She has been able to continue HD on a MWF schedule from L arm AV graft while waiting for new R forearm loop graft to be ready for use however performance of L arm AVG is inconstant.  The patient stays at [facility].  Primary Nephrologist is Dr. [name] and she dialysis at [facility] center.   Physical Examination   Vitals:   07/22/18 1316  Resp: 16  SpO2: 100%  Weight: 154 lb (69.9 kg)  Height: 5\' 5"  (1.651 m)   Body mass index is 25.63 kg/m.  right arm Incision is healed, hand grip is 5/5, sensation in digits is intact, palpable thrill, bruit can be auscultated; edema R forearm improved; palpable R radial    Left arm     Edematous L arm; no engorged neck veins or facial edema; palpable thrill and audible bruit L AVG; palpable L radial pulse  Medical Decision Making   [name] James is a 82 y.o. year old female who presents s/p right forearm arteriovenous graft   The patient's right forearm access is now ready for use.  If R forearm loop graft is functioning well over the next couple weeks, patient will follow up with Dr. Scot Dock to discuss ligation of L arm AVG given LUE edema in the presence of central venous stenosis  We will notify patient's dialysis center to use R arm AVG tomorrow 07/23/18   Dagoberto Ligas PA-C [facility] Office:  [phone]

## 2018-07-23 DIAGNOSIS — R609 Edema, unspecified: Secondary | ICD-10-CM | POA: Diagnosis not present

## 2018-07-23 DIAGNOSIS — D509 Iron deficiency anemia, unspecified: Secondary | ICD-10-CM | POA: Diagnosis not present

## 2018-07-23 DIAGNOSIS — D631 Anemia in chronic kidney disease: Secondary | ICD-10-CM | POA: Diagnosis not present

## 2018-07-23 DIAGNOSIS — N186 End stage renal disease: Secondary | ICD-10-CM | POA: Diagnosis not present

## 2018-07-23 DIAGNOSIS — N2581 Secondary hyperparathyroidism of renal origin: Secondary | ICD-10-CM | POA: Diagnosis not present

## 2018-07-23 DIAGNOSIS — L0390 Cellulitis, unspecified: Secondary | ICD-10-CM | POA: Diagnosis not present

## 2018-07-23 DIAGNOSIS — I10 Essential (primary) hypertension: Secondary | ICD-10-CM | POA: Diagnosis not present

## 2018-07-23 DIAGNOSIS — Z992 Dependence on renal dialysis: Secondary | ICD-10-CM | POA: Diagnosis not present

## 2018-07-23 DIAGNOSIS — E1122 Type 2 diabetes mellitus with diabetic chronic kidney disease: Secondary | ICD-10-CM | POA: Diagnosis not present

## 2018-07-26 DIAGNOSIS — D509 Iron deficiency anemia, unspecified: Secondary | ICD-10-CM | POA: Diagnosis not present

## 2018-07-26 DIAGNOSIS — N186 End stage renal disease: Secondary | ICD-10-CM | POA: Diagnosis not present

## 2018-07-26 DIAGNOSIS — N2581 Secondary hyperparathyroidism of renal origin: Secondary | ICD-10-CM | POA: Diagnosis not present

## 2018-07-26 DIAGNOSIS — D631 Anemia in chronic kidney disease: Secondary | ICD-10-CM | POA: Diagnosis not present

## 2018-07-26 DIAGNOSIS — E1122 Type 2 diabetes mellitus with diabetic chronic kidney disease: Secondary | ICD-10-CM | POA: Diagnosis not present

## 2018-07-28 DIAGNOSIS — D631 Anemia in chronic kidney disease: Secondary | ICD-10-CM | POA: Diagnosis not present

## 2018-07-28 DIAGNOSIS — N2581 Secondary hyperparathyroidism of renal origin: Secondary | ICD-10-CM | POA: Diagnosis not present

## 2018-07-28 DIAGNOSIS — N186 End stage renal disease: Secondary | ICD-10-CM | POA: Diagnosis not present

## 2018-07-28 DIAGNOSIS — D509 Iron deficiency anemia, unspecified: Secondary | ICD-10-CM | POA: Diagnosis not present

## 2018-07-28 DIAGNOSIS — G4700 Insomnia, unspecified: Secondary | ICD-10-CM | POA: Diagnosis not present

## 2018-07-28 DIAGNOSIS — F419 Anxiety disorder, unspecified: Secondary | ICD-10-CM | POA: Diagnosis not present

## 2018-07-28 DIAGNOSIS — E1122 Type 2 diabetes mellitus with diabetic chronic kidney disease: Secondary | ICD-10-CM | POA: Diagnosis not present

## 2018-07-30 DIAGNOSIS — N2581 Secondary hyperparathyroidism of renal origin: Secondary | ICD-10-CM | POA: Diagnosis not present

## 2018-07-30 DIAGNOSIS — E1122 Type 2 diabetes mellitus with diabetic chronic kidney disease: Secondary | ICD-10-CM | POA: Diagnosis not present

## 2018-07-30 DIAGNOSIS — D509 Iron deficiency anemia, unspecified: Secondary | ICD-10-CM | POA: Diagnosis not present

## 2018-07-30 DIAGNOSIS — I959 Hypotension, unspecified: Secondary | ICD-10-CM | POA: Diagnosis not present

## 2018-07-30 DIAGNOSIS — D631 Anemia in chronic kidney disease: Secondary | ICD-10-CM | POA: Diagnosis not present

## 2018-07-30 DIAGNOSIS — N186 End stage renal disease: Secondary | ICD-10-CM | POA: Diagnosis not present

## 2018-08-02 DIAGNOSIS — N186 End stage renal disease: Secondary | ICD-10-CM | POA: Diagnosis not present

## 2018-08-02 DIAGNOSIS — N2581 Secondary hyperparathyroidism of renal origin: Secondary | ICD-10-CM | POA: Diagnosis not present

## 2018-08-02 DIAGNOSIS — E1122 Type 2 diabetes mellitus with diabetic chronic kidney disease: Secondary | ICD-10-CM | POA: Diagnosis not present

## 2018-08-02 DIAGNOSIS — D509 Iron deficiency anemia, unspecified: Secondary | ICD-10-CM | POA: Diagnosis not present

## 2018-08-02 DIAGNOSIS — D631 Anemia in chronic kidney disease: Secondary | ICD-10-CM | POA: Diagnosis not present

## 2018-08-03 DIAGNOSIS — I959 Hypotension, unspecified: Secondary | ICD-10-CM | POA: Diagnosis not present

## 2018-08-03 DIAGNOSIS — D649 Anemia, unspecified: Secondary | ICD-10-CM | POA: Diagnosis not present

## 2018-08-04 DIAGNOSIS — D631 Anemia in chronic kidney disease: Secondary | ICD-10-CM | POA: Diagnosis not present

## 2018-08-04 DIAGNOSIS — E1122 Type 2 diabetes mellitus with diabetic chronic kidney disease: Secondary | ICD-10-CM | POA: Diagnosis not present

## 2018-08-04 DIAGNOSIS — N186 End stage renal disease: Secondary | ICD-10-CM | POA: Diagnosis not present

## 2018-08-04 DIAGNOSIS — N2581 Secondary hyperparathyroidism of renal origin: Secondary | ICD-10-CM | POA: Diagnosis not present

## 2018-08-04 DIAGNOSIS — D509 Iron deficiency anemia, unspecified: Secondary | ICD-10-CM | POA: Diagnosis not present

## 2018-08-06 DIAGNOSIS — E1122 Type 2 diabetes mellitus with diabetic chronic kidney disease: Secondary | ICD-10-CM | POA: Diagnosis not present

## 2018-08-06 DIAGNOSIS — D631 Anemia in chronic kidney disease: Secondary | ICD-10-CM | POA: Diagnosis not present

## 2018-08-06 DIAGNOSIS — D509 Iron deficiency anemia, unspecified: Secondary | ICD-10-CM | POA: Diagnosis not present

## 2018-08-06 DIAGNOSIS — N186 End stage renal disease: Secondary | ICD-10-CM | POA: Diagnosis not present

## 2018-08-06 DIAGNOSIS — N2581 Secondary hyperparathyroidism of renal origin: Secondary | ICD-10-CM | POA: Diagnosis not present

## 2018-08-09 ENCOUNTER — Encounter: Payer: Self-pay | Admitting: Vascular Surgery

## 2018-08-09 ENCOUNTER — Ambulatory Visit (INDEPENDENT_AMBULATORY_CARE_PROVIDER_SITE_OTHER): Payer: Self-pay | Admitting: Vascular Surgery

## 2018-08-09 ENCOUNTER — Encounter: Payer: Self-pay | Admitting: *Deleted

## 2018-08-09 ENCOUNTER — Other Ambulatory Visit: Payer: Self-pay | Admitting: *Deleted

## 2018-08-09 VITALS — HR 69 | Temp 97.0°F | Resp 16 | Ht 65.0 in | Wt 152.0 lb

## 2018-08-09 DIAGNOSIS — E1122 Type 2 diabetes mellitus with diabetic chronic kidney disease: Secondary | ICD-10-CM | POA: Diagnosis not present

## 2018-08-09 DIAGNOSIS — D509 Iron deficiency anemia, unspecified: Secondary | ICD-10-CM | POA: Diagnosis not present

## 2018-08-09 DIAGNOSIS — N2581 Secondary hyperparathyroidism of renal origin: Secondary | ICD-10-CM | POA: Diagnosis not present

## 2018-08-09 DIAGNOSIS — Z992 Dependence on renal dialysis: Secondary | ICD-10-CM

## 2018-08-09 DIAGNOSIS — D631 Anemia in chronic kidney disease: Secondary | ICD-10-CM | POA: Diagnosis not present

## 2018-08-09 DIAGNOSIS — N186 End stage renal disease: Secondary | ICD-10-CM

## 2018-08-09 NOTE — Progress Notes (Signed)
Patient name: [name] James MRN: [id number] DOB: 09-03-1936 Sex: female  REASON FOR VISIT:   Follow-up of steal syndrome right upper extremity.  HPI:   [name] James is a pleasant 82 y.o. female who was seen by Arlee Muslim, PA on 07/22/2018.  The patient had a right arm graft placed on 06/08/2018.  This graft was working well and she has been using it for the last 3 weeks without any problem.  She has a poorly functioning graft in the left arm with chronic swelling in the left arm related to a central venous occlusion which is been addressed multiple times.  She presents today to discuss ligation of her left arm graft which was the original plan.  She dialyzes on Monday Wednesdays and Fridays but feels strongly about not having surgery on a nondialysis day as she states that this tires her out too much.  She would strongly prefer to have her surgery done on Friday.  Current Outpatient Medications  Medication Sig Dispense Refill  . allopurinol (ZYLOPRIM) 100 MG tablet Take 100 mg by mouth daily.    Marland Kitchen aspirin 81 MG chewable tablet Chew 81 mg by mouth daily.    . B Complex-C-Folic Acid (NEPHRO VITAMINS) 0.8 MG TABS Take 1 tablet by mouth daily.    . diazepam (VALIUM) 5 MG tablet Take 1 tablet (5 mg total) by mouth at bedtime. 5 tablet 0  . DIMETHICONE, TOPICAL, 5 % CREA Apply 1 application topically See admin instructions. Place a nickel-sized amount onto buttock(s) area 2 times a day for barrier protection    . dorzolamide-timolol (COSOPT) 22.3-6.8 MG/ML ophthalmic solution Place 1 drop into the left eye 2 (two) times daily. Wait 2-5 minutes when giving two eye meds    . loperamide (IMODIUM A-D) 2 MG tablet Take 2 mg by mouth 3 (three) times daily as needed (for diarrhea).     . loratadine (CLARITIN) 10 MG tablet Take 10 mg by mouth daily.    . Melatonin 5 MG TABS Take 5 mg by mouth at bedtime.     . midodrine (PROAMATINE) 10 MG tablet Take 10 mg by mouth See admin instructions. Take 10 mg  by mouth WITH MORNING MEDS AT FACILITY on Mon/Wed/Fri- dialysis days    . OXYGEN Inhale 2 L into the lungs continuous.     Vladimir Faster Glycol-Propyl Glycol (SYSTANE ULTRA) 0.4-0.3 % SOLN Place 1 drop into both eyes 3 (three) times daily.    . promethazine (PHENERGAN) 25 MG tablet Take 25 mg by mouth every 8 (eight) hours as needed for nausea or vomiting.    Marland Kitchen acetaminophen (TYLENOL) 325 MG tablet Take 650 mg by mouth every 6 (six) hours as needed for fever ("for vomiting/nausea," per [facility]).     Marland Kitchen oxyCODONE (ROXICODONE) 5 MG immediate release tablet Take 1 tablet (5 mg total) by mouth every 4 (four) hours as needed for severe pain. (Patient not taking: Reported on 08/09/2018) 10 tablet 0   No current facility-administered medications for this visit.     REVIEW OF SYSTEMS:  [X]  denotes positive finding, [ ]  denotes negative finding Vascular    Leg swelling    Cardiac    Chest pain or chest pressure:    Shortness of breath upon exertion:    Short of breath when lying flat:    Irregular heart rhythm:    Constitutional    Fever or chills:     PHYSICAL EXAM:   Vitals:   08/09/18 1230  Pulse: 69  Resp: 16  Temp: (!) 97 F (36.1 C)  SpO2: 100%  Weight: 152 lb (68.9 kg)  Height: 5\' 5"  (1.651 m)    GENERAL: The patient is a well-nourished female, in no acute distress. The vital signs are documented above. CARDIOVASCULAR: There is a regular rate and rhythm. PULMONARY: There is good air exchange bilaterally without wheezing or rales. Her right arm graft has a good thrill.  The right hand is warm and well-perfused. She has moderate swelling in the left arm.  DATA:   No new data  MEDICAL ISSUES:   END-STAGE RENAL DISEASE: Her new right arm AV graft is working well.  At this point I think it is safe to proceed with ligation of her left arm graft because of chronic swelling.  She feels strongly about having this done on Friday and therefore this has been scheduled for 08/27/2018.  She will  dialyze Monday Wednesday and Thursday of that week.  [name] [facility] [phone]

## 2018-08-11 DIAGNOSIS — D509 Iron deficiency anemia, unspecified: Secondary | ICD-10-CM | POA: Diagnosis not present

## 2018-08-11 DIAGNOSIS — E1122 Type 2 diabetes mellitus with diabetic chronic kidney disease: Secondary | ICD-10-CM | POA: Diagnosis not present

## 2018-08-11 DIAGNOSIS — N2581 Secondary hyperparathyroidism of renal origin: Secondary | ICD-10-CM | POA: Diagnosis not present

## 2018-08-11 DIAGNOSIS — N186 End stage renal disease: Secondary | ICD-10-CM | POA: Diagnosis not present

## 2018-08-11 DIAGNOSIS — D631 Anemia in chronic kidney disease: Secondary | ICD-10-CM | POA: Diagnosis not present

## 2018-08-13 DIAGNOSIS — D631 Anemia in chronic kidney disease: Secondary | ICD-10-CM | POA: Diagnosis not present

## 2018-08-13 DIAGNOSIS — E1122 Type 2 diabetes mellitus with diabetic chronic kidney disease: Secondary | ICD-10-CM | POA: Diagnosis not present

## 2018-08-13 DIAGNOSIS — N186 End stage renal disease: Secondary | ICD-10-CM | POA: Diagnosis not present

## 2018-08-13 DIAGNOSIS — N2581 Secondary hyperparathyroidism of renal origin: Secondary | ICD-10-CM | POA: Diagnosis not present

## 2018-08-13 DIAGNOSIS — D509 Iron deficiency anemia, unspecified: Secondary | ICD-10-CM | POA: Diagnosis not present

## 2018-08-16 DIAGNOSIS — E1122 Type 2 diabetes mellitus with diabetic chronic kidney disease: Secondary | ICD-10-CM | POA: Diagnosis not present

## 2018-08-16 DIAGNOSIS — N186 End stage renal disease: Secondary | ICD-10-CM | POA: Diagnosis not present

## 2018-08-16 DIAGNOSIS — D509 Iron deficiency anemia, unspecified: Secondary | ICD-10-CM | POA: Diagnosis not present

## 2018-08-16 DIAGNOSIS — N2581 Secondary hyperparathyroidism of renal origin: Secondary | ICD-10-CM | POA: Diagnosis not present

## 2018-08-16 DIAGNOSIS — D631 Anemia in chronic kidney disease: Secondary | ICD-10-CM | POA: Diagnosis not present

## 2018-08-18 DIAGNOSIS — E1122 Type 2 diabetes mellitus with diabetic chronic kidney disease: Secondary | ICD-10-CM | POA: Diagnosis not present

## 2018-08-18 DIAGNOSIS — N186 End stage renal disease: Secondary | ICD-10-CM | POA: Diagnosis not present

## 2018-08-18 DIAGNOSIS — N2581 Secondary hyperparathyroidism of renal origin: Secondary | ICD-10-CM | POA: Diagnosis not present

## 2018-08-18 DIAGNOSIS — D631 Anemia in chronic kidney disease: Secondary | ICD-10-CM | POA: Diagnosis not present

## 2018-08-18 DIAGNOSIS — D509 Iron deficiency anemia, unspecified: Secondary | ICD-10-CM | POA: Diagnosis not present

## 2018-08-19 DIAGNOSIS — H00019 Hordeolum externum unspecified eye, unspecified eyelid: Secondary | ICD-10-CM | POA: Diagnosis not present

## 2018-08-19 DIAGNOSIS — N186 End stage renal disease: Secondary | ICD-10-CM | POA: Diagnosis not present

## 2018-08-19 DIAGNOSIS — Z9981 Dependence on supplemental oxygen: Secondary | ICD-10-CM | POA: Diagnosis not present

## 2018-08-19 DIAGNOSIS — D649 Anemia, unspecified: Secondary | ICD-10-CM | POA: Diagnosis not present

## 2018-08-20 DIAGNOSIS — D631 Anemia in chronic kidney disease: Secondary | ICD-10-CM | POA: Diagnosis not present

## 2018-08-20 DIAGNOSIS — D509 Iron deficiency anemia, unspecified: Secondary | ICD-10-CM | POA: Diagnosis not present

## 2018-08-20 DIAGNOSIS — N2581 Secondary hyperparathyroidism of renal origin: Secondary | ICD-10-CM | POA: Diagnosis not present

## 2018-08-20 DIAGNOSIS — N186 End stage renal disease: Secondary | ICD-10-CM | POA: Diagnosis not present

## 2018-08-20 DIAGNOSIS — E1122 Type 2 diabetes mellitus with diabetic chronic kidney disease: Secondary | ICD-10-CM | POA: Diagnosis not present

## 2018-08-22 DIAGNOSIS — Z992 Dependence on renal dialysis: Secondary | ICD-10-CM | POA: Diagnosis not present

## 2018-08-22 DIAGNOSIS — E1122 Type 2 diabetes mellitus with diabetic chronic kidney disease: Secondary | ICD-10-CM | POA: Diagnosis not present

## 2018-08-22 DIAGNOSIS — N186 End stage renal disease: Secondary | ICD-10-CM | POA: Diagnosis not present

## 2018-08-23 ENCOUNTER — Emergency Department (HOSPITAL_COMMUNITY): Payer: Medicare Other

## 2018-08-23 ENCOUNTER — Inpatient Hospital Stay (HOSPITAL_COMMUNITY)
Admission: EM | Admit: 2018-08-23 | Discharge: 2018-08-28 | DRG: 252 | Disposition: A | Discharge status: Skilled Nursing Facility | Payer: Medicare Other | Source: Other Acute Inpatient Hospital | Attending: Family Medicine | Admitting: Family Medicine

## 2018-08-23 ENCOUNTER — Encounter (HOSPITAL_COMMUNITY): Payer: Self-pay | Admitting: Emergency Medicine

## 2018-08-23 DIAGNOSIS — M8580 Other specified disorders of bone density and structure, unspecified site: Secondary | ICD-10-CM | POA: Diagnosis present

## 2018-08-23 DIAGNOSIS — Z7982 Long term (current) use of aspirin: Secondary | ICD-10-CM

## 2018-08-23 DIAGNOSIS — R001 Bradycardia, unspecified: Secondary | ICD-10-CM

## 2018-08-23 DIAGNOSIS — I5032 Chronic diastolic (congestive) heart failure: Secondary | ICD-10-CM | POA: Diagnosis present

## 2018-08-23 DIAGNOSIS — Z9013 Acquired absence of bilateral breasts and nipples: Secondary | ICD-10-CM

## 2018-08-23 DIAGNOSIS — D509 Iron deficiency anemia, unspecified: Secondary | ICD-10-CM | POA: Diagnosis not present

## 2018-08-23 DIAGNOSIS — Z9049 Acquired absence of other specified parts of digestive tract: Secondary | ICD-10-CM

## 2018-08-23 DIAGNOSIS — I480 Paroxysmal atrial fibrillation: Secondary | ICD-10-CM | POA: Diagnosis present

## 2018-08-23 DIAGNOSIS — D631 Anemia in chronic kidney disease: Secondary | ICD-10-CM | POA: Diagnosis present

## 2018-08-23 DIAGNOSIS — N2581 Secondary hyperparathyroidism of renal origin: Secondary | ICD-10-CM | POA: Diagnosis present

## 2018-08-23 DIAGNOSIS — M109 Gout, unspecified: Secondary | ICD-10-CM | POA: Diagnosis present

## 2018-08-23 DIAGNOSIS — I959 Hypotension, unspecified: Secondary | ICD-10-CM

## 2018-08-23 DIAGNOSIS — Z820 Family history of epilepsy and other diseases of the nervous system: Secondary | ICD-10-CM

## 2018-08-23 DIAGNOSIS — E1151 Type 2 diabetes mellitus with diabetic peripheral angiopathy without gangrene: Secondary | ICD-10-CM | POA: Diagnosis present

## 2018-08-23 DIAGNOSIS — N186 End stage renal disease: Secondary | ICD-10-CM | POA: Diagnosis not present

## 2018-08-23 DIAGNOSIS — I953 Hypotension of hemodialysis: Secondary | ICD-10-CM | POA: Diagnosis present

## 2018-08-23 DIAGNOSIS — T82590A Other mechanical complication of surgically created arteriovenous fistula, initial encounter: Secondary | ICD-10-CM | POA: Diagnosis present

## 2018-08-23 DIAGNOSIS — Z881 Allergy status to other antibiotic agents status: Secondary | ICD-10-CM

## 2018-08-23 DIAGNOSIS — E1140 Type 2 diabetes mellitus with diabetic neuropathy, unspecified: Secondary | ICD-10-CM | POA: Diagnosis present

## 2018-08-23 DIAGNOSIS — Z853 Personal history of malignant neoplasm of breast: Secondary | ICD-10-CM

## 2018-08-23 DIAGNOSIS — R531 Weakness: Secondary | ICD-10-CM | POA: Diagnosis not present

## 2018-08-23 DIAGNOSIS — Z9841 Cataract extraction status, right eye: Secondary | ICD-10-CM

## 2018-08-23 DIAGNOSIS — Z823 Family history of stroke: Secondary | ICD-10-CM

## 2018-08-23 DIAGNOSIS — Z9981 Dependence on supplemental oxygen: Secondary | ICD-10-CM

## 2018-08-23 DIAGNOSIS — E211 Secondary hyperparathyroidism, not elsewhere classified: Secondary | ICD-10-CM | POA: Diagnosis not present

## 2018-08-23 DIAGNOSIS — I495 Sick sinus syndrome: Secondary | ICD-10-CM

## 2018-08-23 DIAGNOSIS — IMO0002 Reserved for concepts with insufficient information to code with codable children: Secondary | ICD-10-CM | POA: Diagnosis present

## 2018-08-23 DIAGNOSIS — Z888 Allergy status to other drugs, medicaments and biological substances status: Secondary | ICD-10-CM

## 2018-08-23 DIAGNOSIS — E1122 Type 2 diabetes mellitus with diabetic chronic kidney disease: Secondary | ICD-10-CM | POA: Diagnosis present

## 2018-08-23 DIAGNOSIS — Z807 Family history of other malignant neoplasms of lymphoid, hematopoietic and related tissues: Secondary | ICD-10-CM

## 2018-08-23 DIAGNOSIS — I10 Essential (primary) hypertension: Secondary | ICD-10-CM | POA: Diagnosis not present

## 2018-08-23 DIAGNOSIS — Z992 Dependence on renal dialysis: Secondary | ICD-10-CM

## 2018-08-23 DIAGNOSIS — E1165 Type 2 diabetes mellitus with hyperglycemia: Secondary | ICD-10-CM

## 2018-08-23 DIAGNOSIS — Z885 Allergy status to narcotic agent status: Secondary | ICD-10-CM

## 2018-08-23 DIAGNOSIS — I132 Hypertensive heart and chronic kidney disease with heart failure and with stage 5 chronic kidney disease, or end stage renal disease: Secondary | ICD-10-CM | POA: Diagnosis present

## 2018-08-23 DIAGNOSIS — Z8249 Family history of ischemic heart disease and other diseases of the circulatory system: Secondary | ICD-10-CM

## 2018-08-23 DIAGNOSIS — J9611 Chronic respiratory failure with hypoxia: Secondary | ICD-10-CM | POA: Diagnosis present

## 2018-08-23 DIAGNOSIS — E785 Hyperlipidemia, unspecified: Secondary | ICD-10-CM | POA: Diagnosis present

## 2018-08-23 DIAGNOSIS — Z880 Allergy status to penicillin: Secondary | ICD-10-CM

## 2018-08-23 DIAGNOSIS — M898X9 Other specified disorders of bone, unspecified site: Secondary | ICD-10-CM | POA: Diagnosis present

## 2018-08-23 DIAGNOSIS — J45901 Unspecified asthma with (acute) exacerbation: Secondary | ICD-10-CM | POA: Diagnosis not present

## 2018-08-23 DIAGNOSIS — F419 Anxiety disorder, unspecified: Secondary | ICD-10-CM | POA: Diagnosis present

## 2018-08-23 DIAGNOSIS — K219 Gastro-esophageal reflux disease without esophagitis: Secondary | ICD-10-CM | POA: Diagnosis present

## 2018-08-23 DIAGNOSIS — E1129 Type 2 diabetes mellitus with other diabetic kidney complication: Secondary | ICD-10-CM | POA: Diagnosis present

## 2018-08-23 DIAGNOSIS — Z833 Family history of diabetes mellitus: Secondary | ICD-10-CM

## 2018-08-23 LAB — COMPREHENSIVE METABOLIC PANEL
ALBUMIN: 3.2 g/dL — AB (ref 3.5–5.0)
ALK PHOS: 132 U/L — AB (ref 38–126)
ALT: 27 U/L (ref 0–44)
AST: 50 U/L — AB (ref 15–41)
Anion gap: 16 — ABNORMAL HIGH (ref 5–15)
BILIRUBIN TOTAL: 0.7 mg/dL (ref 0.3–1.2)
BUN: 30 mg/dL — ABNORMAL HIGH (ref 8–23)
CALCIUM: 7.8 mg/dL — AB (ref 8.9–10.3)
CO2: 20 mmol/L — AB (ref 22–32)
Chloride: 99 mmol/L (ref 98–111)
Creatinine, Ser: 4.25 mg/dL — ABNORMAL HIGH (ref 0.44–1.00)
GFR calc Af Amer: 10 mL/min — ABNORMAL LOW (ref >60)
GFR calc non Af Amer: 9 mL/min — ABNORMAL LOW (ref >60)
GLUCOSE: 134 mg/dL — AB (ref 70–99)
Potassium: 4.5 mmol/L (ref 3.5–5.1)
SODIUM: 135 mmol/L (ref 135–145)
TOTAL PROTEIN: 7.6 g/dL (ref 6.5–8.1)

## 2018-08-23 LAB — TSH: TSH: 1.417 u[IU]/mL (ref 0.350–4.500)

## 2018-08-23 LAB — CBC WITH DIFFERENTIAL/PLATELET
ABS IMMATURE GRANULOCYTES: 0 10*3/uL (ref 0.0–0.1)
BASOS ABS: 0.1 10*3/uL (ref 0.0–0.1)
Basophils Relative: 1 %
EOS PCT: 2 %
Eosinophils Absolute: 0.1 10*3/uL (ref 0.0–0.7)
HEMATOCRIT: 43.5 % (ref 36.0–46.0)
HEMOGLOBIN: 13.8 g/dL (ref 12.0–15.0)
IMMATURE GRANULOCYTES: 0 %
LYMPHS ABS: 2.5 10*3/uL (ref 0.7–4.0)
LYMPHS PCT: 34 %
MCH: 33.3 pg (ref 26.0–34.0)
MCHC: 31.7 g/dL (ref 30.0–36.0)
MCV: 104.8 fL — ABNORMAL HIGH (ref 78.0–100.0)
Monocytes Absolute: 0.7 10*3/uL (ref 0.1–1.0)
Monocytes Relative: 10 %
NEUTROS ABS: 4 10*3/uL (ref 1.7–7.7)
Neutrophils Relative %: 53 %
Platelets: 158 10*3/uL (ref 150–400)
RBC: 4.15 MIL/uL (ref 3.87–5.11)
RDW: 15.9 % — ABNORMAL HIGH (ref 11.5–15.5)
WBC: 7.4 10*3/uL (ref 4.0–10.5)

## 2018-08-23 LAB — CORTISOL-AM, BLOOD: CORTISOL - AM: 9.6 ug/dL (ref 6.7–22.6)

## 2018-08-23 LAB — I-STAT CHEM 8, ED
BUN: 33 mg/dL — AB (ref 8–23)
CHLORIDE: 102 mmol/L (ref 98–111)
Calcium, Ion: 0.89 mmol/L — CL (ref 1.15–1.40)
Creatinine, Ser: 4.5 mg/dL — ABNORMAL HIGH (ref 0.44–1.00)
Glucose, Bld: 138 mg/dL — ABNORMAL HIGH (ref 70–99)
HCT: 45 % (ref 36.0–46.0)
Hemoglobin: 15.3 g/dL — ABNORMAL HIGH (ref 12.0–15.0)
Potassium: 4.3 mmol/L (ref 3.5–5.1)
Sodium: 135 mmol/L (ref 135–145)
TCO2: 21 mmol/L — AB (ref 22–32)

## 2018-08-23 LAB — T4, FREE: Free T4: 0.88 ng/dL (ref 0.82–1.77)

## 2018-08-23 LAB — I-STAT TROPONIN, ED: Troponin i, poc: 0.02 ng/mL (ref 0.00–0.08)

## 2018-08-23 MED ORDER — ACETAMINOPHEN 325 MG PO TABS
650.0000 mg | ORAL_TABLET | Freq: Four times a day (QID) | ORAL | Status: DC | PRN
  Administered 2018-08-27: 650 mg via ORAL
  Filled 2018-08-23: qty 2

## 2018-08-23 MED ORDER — MIDODRINE HCL 5 MG PO TABS
10.0000 mg | ORAL_TABLET | Freq: Once | ORAL | Status: AC
  Administered 2018-08-23: 10 mg via ORAL
  Filled 2018-08-23 (×2): qty 2

## 2018-08-23 MED ORDER — ACETAMINOPHEN 650 MG RE SUPP: 650.0000 mg | Freq: Four times a day (QID) | RECTAL | Status: DC | PRN

## 2018-08-23 MED ORDER — DIMETHICONE 1 % EX CREA
1.0000 "application " | TOPICAL_CREAM | Freq: Two times a day (BID) | CUTANEOUS | Status: DC
  Administered 2018-08-24 – 2018-08-28 (×8): 1 via TOPICAL
  Filled 2018-08-23: qty 113

## 2018-08-23 MED ORDER — ATROPINE SULFATE 1 MG/ML IJ SOLN
0.4000 mg | INTRAMUSCULAR | Status: DC | PRN
  Filled 2018-08-23 (×2): qty 0.4

## 2018-08-23 MED ORDER — DORZOLAMIDE HCL-TIMOLOL MAL 2-0.5 % OP SOLN
1.0000 [drp] | Freq: Two times a day (BID) | OPHTHALMIC | Status: DC
  Administered 2018-08-24 – 2018-08-28 (×9): 1 [drp] via OPHTHALMIC
  Filled 2018-08-23: qty 10

## 2018-08-23 MED ORDER — MIDODRINE HCL 5 MG PO TABS
10.0000 mg | ORAL_TABLET | Freq: Once | ORAL | Status: AC
  Administered 2018-08-24: 10 mg via ORAL
  Filled 2018-08-23 (×2): qty 2

## 2018-08-23 MED ORDER — DIAZEPAM 5 MG PO TABS
5.0000 mg | ORAL_TABLET | Freq: Every day | ORAL | Status: DC
  Administered 2018-08-24 – 2018-08-27 (×2): 5 mg via ORAL
  Filled 2018-08-23 (×4): qty 1

## 2018-08-23 MED ORDER — ONDANSETRON HCL 4 MG/2ML IJ SOLN
4.0000 mg | Freq: Four times a day (QID) | INTRAMUSCULAR | Status: DC | PRN
  Administered 2018-08-24: 4 mg via INTRAVENOUS
  Filled 2018-08-23: qty 2

## 2018-08-23 MED ORDER — LORATADINE 10 MG PO TABS
10.0000 mg | ORAL_TABLET | Freq: Every day | ORAL | Status: DC
  Administered 2018-08-24 – 2018-08-28 (×4): 10 mg via ORAL
  Filled 2018-08-23 (×5): qty 1

## 2018-08-23 MED ORDER — MELATONIN 3 MG PO TABS
6.0000 mg | ORAL_TABLET | Freq: Every day | ORAL | Status: DC
  Administered 2018-08-24 – 2018-08-27 (×5): 6 mg via ORAL
  Filled 2018-08-23 (×5): qty 2

## 2018-08-23 MED ORDER — ASPIRIN 81 MG PO CHEW
81.0000 mg | CHEWABLE_TABLET | Freq: Every day | ORAL | Status: DC
  Administered 2018-08-24 – 2018-08-28 (×4): 81 mg via ORAL
  Filled 2018-08-23 (×5): qty 1

## 2018-08-23 MED ORDER — ONDANSETRON HCL 4 MG PO TABS: 4.0000 mg | ORAL_TABLET | Freq: Four times a day (QID) | ORAL | Status: DC | PRN

## 2018-08-23 MED ORDER — HYPROMELLOSE (GONIOSCOPIC) 2.5 % OP SOLN
1.0000 [drp] | Freq: Three times a day (TID) | OPHTHALMIC | Status: DC
  Administered 2018-08-24 – 2018-08-26 (×4): 1 [drp] via OPHTHALMIC
  Filled 2018-08-23: qty 15

## 2018-08-23 MED ORDER — ALLOPURINOL 100 MG PO TABS
100.0000 mg | ORAL_TABLET | Freq: Every day | ORAL | Status: DC
  Administered 2018-08-24 – 2018-08-28 (×4): 100 mg via ORAL
  Filled 2018-08-23 (×5): qty 1

## 2018-08-23 MED ORDER — SODIUM CHLORIDE 0.9 % IV BOLUS
1000.0000 mL | Freq: Once | INTRAVENOUS | Status: AC
  Administered 2018-08-23: 1000 mL via INTRAVENOUS

## 2018-08-23 MED ORDER — RENA-VITE PO TABS
1.0000 | ORAL_TABLET | Freq: Every day | ORAL | Status: DC
  Administered 2018-08-24 – 2018-08-27 (×5): 1 via ORAL
  Filled 2018-08-23 (×5): qty 1

## 2018-08-23 MED ORDER — HEPARIN SODIUM (PORCINE) 5000 UNIT/ML IJ SOLN
5000.0000 [IU] | Freq: Three times a day (TID) | INTRAMUSCULAR | Status: DC
  Administered 2018-08-24 – 2018-08-28 (×12): 5000 [IU] via SUBCUTANEOUS
  Filled 2018-08-23 (×13): qty 1

## 2018-08-23 MED ORDER — ZOLPIDEM TARTRATE 5 MG PO TABS: 5.0000 mg | ORAL_TABLET | Freq: Every evening | ORAL | Status: DC | PRN

## 2018-08-23 MED ORDER — HYDROCORTISONE NA SUCCINATE PF 100 MG IJ SOLR
50.0000 mg | Freq: Once | INTRAMUSCULAR | Status: AC
  Administered 2018-08-24: 50 mg via INTRAVENOUS
  Filled 2018-08-23: qty 2

## 2018-08-23 MED ORDER — ATROPINE SULFATE 0.4 MG/ML IJ SOLN
0.2000 mg | Freq: Once | INTRAMUSCULAR | Status: AC
  Administered 2018-08-23: 0.2 mg via INTRAVENOUS
  Filled 2018-08-23 (×2): qty 0.5

## 2018-08-23 MED ORDER — LOPERAMIDE HCL 2 MG PO CAPS: 2.0000 mg | ORAL_CAPSULE | Freq: Three times a day (TID) | ORAL | Status: DC | PRN

## 2018-08-23 NOTE — ED Triage Notes (Signed)
Pt arrives via EMS from [facility] with reports of HR in 40s and low BP. EMS reports BP anywhere from 70/50 to 120/70, BP must be taken in leg due to HD access. Pt had 3 hrs 45 min of HD today and started to feel bad during. Pt A&O, reports feeling tired.

## 2018-08-23 NOTE — ED Provider Notes (Addendum)
[facility] EMERGENCY DEPARTMENT Provider Note   CSN: [id number] Arrival date & time: 08/23/18  1809     History   Chief Complaint Chief Complaint  Patient presents with  . Hypotension  . Bradycardia    HPI [name] James is a 82 y.o. female.  HPI 82 year old female with history as below including history of chronic A. fib, end-stage renal disease, hyperlipidemia, here with profound weakness and near syncope.  The patient states that she has been feeling generally weak for the last several days.  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  She sees Dr. [name].  She states that she has been having difficulty at dialysis with them accessing her graft.  She went to dialysis today and states she felt fatigued going to dialysis.  They were ultimately actually able to access her graft and were able to complete a full session.  She states that during the session, she became incredibly weak and felt like she was going to pass out.  Her blood pressures were in the 36O to 29U systolic and heart rates were as low as the mid 30s.  EMS was subsequently called and she was told to come to the ED.  Of note, she was admitted in July for somewhat similar symptoms, and her beta-blocker was held as well as her amiodarone.  She was viewed a very difficult candidate for a pacemaker given her history of multiple surgeries.  She denies taking any new medications.  Denies any recent fevers or chills.  No chest pain.  She continues to feel generally very weak.  Past Medical History:  Diagnosis Date  . Anemia, chronic disease   . Anxiety   . Arthritis    knees  . Atrial fibrillation ([place])   . Barrett esophagus   . Benign paroxysmal positional vertigo   . Breast cancer ([place]) 1980s   bil mastectomies, no radiation or chemo  . Cataract   . Chest pain, atypical 12/05/2008   R/Lmv-  normal perfusion all regions, noe ECG changes   . CHF (congestive heart failure) ([place]) 05/13/2011   echo - TM>54%; stage 1 diastolic dysfunction; elevated LV filling pressure, MAC  . CKD (chronic kidney disease), stage III ([place])   . Claudication ([place]) 10/30/2005   doppler - normal evaluation, no evidence of aneurysm, diameter reduction, dissection, compression or vascular abnormality)  . Complication of anesthesia   . Dyslipidemia   . Dysrhythmia    Atrial Fibrillation  . Edema   . ESRD (end stage renal disease) on dialysis ([place])    "[facility]; [address]; MWF" (07/06/2018)  . Family history of adverse reaction to anesthesia    son also has nausea   . Fever blister 12/2016  . Gastritis and gastroduodenitis   . GERD (gastroesophageal reflux disease)   . Gout   . Hemodialysis-associated hypotension   . HLD (hyperlipidemia)   . Hypertension   . IBS (irritable bowel syndrome)   . Insomnia   . Morbid obesity ([place])   . On home oxygen therapy    "2L; 24/7" (07/06/2018)  . PAF (paroxysmal atrial fibrillation) ([place])   . Peripheral neuropathy   . Pneumonia 12/2016  . PONV (postoperative nausea and vomiting)   . Renal cyst   . Renal insufficiency 10/30/2005   doppler - abn resistance consistent w/ parenchymal disease  . Renovascular hypertension   . Type 2  diabetes mellitus with diabetic neuropathy, with long-term current use of insulin ([place])   . Vertigo     Patient Active Problem List   Diagnosis Date Noted  . Gout 08/23/2018  . Anxiety 08/23/2018  . Asthma exacerbation 08/23/2018  . Chest pain 07/06/2018  . Bradycardia 07/06/2018  . Pneumothorax, left 10/13/2017  . Multiple lung nodules on CT 10/06/2017  . Pleural effusion, left 10/06/2017  . Diabetic polyneuropathy associated with type 2 diabetes mellitus ([place]) 07/30/2017  . Onychomycosis of toenail 07/30/2017  . Idiopathic chronic venous hypertension of both lower extremities with inflammation 07/30/2017  . Chronic respiratory  failure with hypoxia ([place]) 04/29/2017  . Morbid (severe) obesity due to excess calories ([place]) 04/29/2017  . LFT elevation   . Advance care planning   . Goals of care, counseling/discussion   . Palliative care by specialist   . Pressure ulcer 01/19/2017  . Long term current use of amiodarone 10/17/2016  . History of pressure ulcer 10/17/2016  . GAD (generalized anxiety disorder) 10/17/2016  . Osteopenia determined by x-ray 04/17/2016  . Controlled type 2 diabetes mellitus with diabetic polyneuropathy, with long-term current use of insulin ([place]) 04/17/2016  . Type 2 diabetes mellitus with diabetic neuropathy ([place]) 06/08/2015  . Type 2 diabetes, controlled, with renal manifestation ([place]) 06/08/2015  . PVD (peripheral vascular disease) ([place]) 06/08/2015  . Swelling of limb-Left arm 04/10/2015  . Nausea with vomiting 01/12/2015  . AP (abdominal pain) 01/12/2015  . Normocytic anemia 01/12/2015  . Paroxysmal atrial fibrillation ([place]) 12/22/2014  . Melena   . ESRD on dialysis ([place]) 12/19/2014  . Chronic diastolic (congestive) heart failure ([place]) 12/19/2014  . Palpitation 12/19/2014  . Anemia, chronic disease 12/13/2014  . Hypotension 12/13/2014  . Pulmonary edema 11/10/2014  . Physical deconditioning 10/29/2014  . Iron deficiency anemia   . Gastritis and gastroduodenitis 10/24/2014  . Heme positive stool 10/24/2014  . Morbid obesity ([place]) 10/18/2014  . Edema 10/18/2014  . Essential hypertension 10/17/2014  . DM type 2, uncontrolled, with renal complications ([place]) 16/09/9603  . Dyslipidemia 10/17/2014  . Anemia 10/17/2014    Past Surgical History:  Procedure Laterality Date  . A/V FISTULAGRAM Left 03/19/2018   Procedure: A/V FISTULAGRAM;  Surgeon: Angelia Mould, MD;  Location: [facility];  Service: Cardiovascular;  Laterality: Left;  . A/V SHUNTOGRAM Left 01/19/2018   Procedure: A/V SHUNTOGRAM;  Surgeon: Serafina Mitchell, MD;  Location: [facility];  Service:  Cardiovascular;  Laterality: Left;  . ANGIOPLASTY Left 02/12/2015   Procedure: ANGIOPLASTY;  Surgeon: Angelia Mould, MD;  Location: [facility] CATH LAB;  Service: Cardiovascular;  Laterality: Left;  AVF  . AV FISTULA PLACEMENT Left 11/30/2014   Procedure: ARTERIOVENOUS (AV) FISTULA CREATION LEFT ARM;  Surgeon: Angelia Mould, MD;  Location: [facility];  Service: Vascular;  Laterality: Left;  . AV FISTULA PLACEMENT Left 03/20/2015   Procedure:  Inserton of Left Upper Arm Gortex Graft;  Surgeon: Angelia Mould, MD;  Location: [facility];  Service: Vascular;  Laterality: Left;  . AV FISTULA PLACEMENT Left 04/12/2015   Procedure: INSERTION OF LEFT ARM  ARTERIOVENOUS GORE-TEX GRAFT ;  Surgeon: Angelia Mould, MD;  Location: [facility];  Service: Vascular;  Laterality: Left;  . AV FISTULA PLACEMENT Right 06/08/2018   Procedure: INSERTION OF ARTERIOVENOUS (AV) GORE-TEX GRAFT ARM USING 4-7MM X 45CM GORETEX GRAFT;  Surgeon: Angelia Mould, MD;  Location: [facility];  Service: Vascular;  Laterality: Right;  . BACK SURGERY  ~1980  .  CATARACT EXTRACTION Right   . CHOLECYSTECTOMY    . ESOPHAGOGASTRODUODENOSCOPY N/A 10/24/2014   Procedure: ESOPHAGOGASTRODUODENOSCOPY (EGD);  Surgeon: Jerene Bears, MD;  Location[facility] ENDOSCOPY;  Service: Endoscopy;  Laterality: N/A;  . EXCHANGE OF A DIALYSIS CATHETER Left 11/30/2014   Procedure: EXCHANGE OF A DIALYSIS CATHETER, LEFT INTERNAL JUGULAR;  Surgeon: Angelia Mould, MD;  Location: [facility];  Service: Vascular;  Laterality: Left;  . EYE SURGERY Left    transplant  . FISTULOGRAM N/A 02/12/2015   Procedure: FISTULOGRAM;  Surgeon: Angelia Mould, MD;  Location: [facility] CATH LAB;  Service: Cardiovascular;  Laterality: N/A;  . HEMORRHOID SURGERY    . LIGATION OF ARTERIOVENOUS  FISTULA Left 03/20/2015   Procedure: LIGATION OF ARTERIOVENOUS  FISTULA;  Surgeon: Angelia Mould, MD;  Location: [facility];  Service: Vascular;  Laterality: Left;  Marland Kitchen MASTECTOMY  Bilateral ~1982   bil breast reconstruction with implants  . ORIF TIBIA & FIBULA FRACTURES Left 2007   also had left non displaced malleolar fracture.   Marland Kitchen PERIPHERAL VASCULAR BALLOON ANGIOPLASTY Left 01/19/2018   Procedure: PERIPHERAL VASCULAR BALLOON ANGIOPLASTY;  Surgeon: Serafina Mitchell, MD;  Location: [facility] LAB;  Service: Cardiovascular;  Laterality: Left;  fistula  . PERIPHERAL VASCULAR BALLOON ANGIOPLASTY Left 03/19/2018   Procedure: PERIPHERAL VASCULAR BALLOON ANGIOPLASTY;  Surgeon: Angelia Mould, MD;  Location: [facility] CV LAB;  Service: Cardiovascular;  Laterality: Left;  UPPER ARM FISTULA  . REMOVAL OF GRAFT Left 04/12/2015   Procedure: REMOVAL OF LEFT ARM ARTERIOVENOUS GORE-TEX GRAFT;  Surgeon: Angelia Mould, MD;  Location: [facility];  Service: Vascular;  Laterality: Left;     OB History   None      Home Medications    Prior to Admission medications   Medication Sig Start Date End Date Taking? Authorizing Provider  acetaminophen (TYLENOL) 325 MG tablet Take 650 mg by mouth every 6 (six) hours as needed for fever ("for vomiting/nausea," per [facility]).    Yes [provider]  allopurinol (ZYLOPRIM) 100 MG tablet Take 100 mg by mouth daily.   Yes [provider]  aspirin 81 MG chewable tablet Chew 81 mg by mouth daily.   Yes [provider]  B Complex-C-Folic Acid (NEPHRO VITAMINS) 0.8 MG TABS Take 1 tablet by mouth daily.   Yes [provider]  diazepam (VALIUM) 5 MG tablet Take 1 tablet (5 mg total) by mouth at bedtime. 07/08/18  Yes Arrien, Jimmy Picket, MD  DIMETHICONE, TOPICAL, 5 % CREA Apply 1 application topically See admin instructions. Place a nickel-sized amount onto buttock(s) area 2 times a day for barrier protection   Yes [provider]  dorzolamide-timolol (COSOPT) 22.3-6.8 MG/ML ophthalmic solution Place 1 drop into the left eye 2 (two) times daily. Wait 2-5 minutes when giving two eye meds   Yes  [provider]  hydrALAZINE (APRESOLINE) 10 MG tablet Take 5 mg by mouth 3 (three) times daily as needed (hypertension).   Yes [provider]  loperamide (IMODIUM A-D) 2 MG tablet Take 2 mg by mouth 3 (three) times daily as needed (for diarrhea).    Yes [provider]  loratadine (CLARITIN) 10 MG tablet Take 10 mg by mouth daily.   Yes [provider]  Melatonin 5 MG TABS Take 6 mg by mouth at bedtime.    Yes [provider]  midodrine (PROAMATINE) 10 MG tablet Take 10 mg by mouth See admin instructions. Take 10 mg by mouth WITH  MORNING MEDS AT FACILITY on Mon/Wed/Fri- dialysis days Take 10 mg by mouth with dialysis on Mon/Wed/Fri   Yes [provider]  OXYGEN Inhale 2 L into the lungs continuous.    Yes [provider]  Polyethyl Glycol-Propyl Glycol (SYSTANE ULTRA) 0.4-0.3 % SOLN Place 1 drop into both eyes 3 (three) times daily.   Yes [provider]  promethazine (PHENERGAN) 25 MG tablet Take 25 mg by mouth every 8 (eight) hours as needed for nausea or vomiting.   Yes [provider]  oxyCODONE (ROXICODONE) 5 MG immediate release tablet Take 1 tablet (5 mg total) by mouth every 4 (four) hours as needed for severe pain. Patient not taking: Reported on 08/09/2018 07/08/18   Arrien, Jimmy Picket, MD    Family History Family History  Problem Relation Age of Onset  . Diabetes Mother   . Heart disease Mother   . Non-Hodgkin's lymphoma Mother   . Heart attack Father   . Stroke Father   . Parkinson's disease Brother   . Lung disease Brother   . Heart disease Brother   . Multiple myeloma Sister     Social History Social History   Tobacco Use  . Smoking status: Never Smoker  . Smokeless tobacco: Never Used  Substance Use Topics  . Alcohol use: No  . Drug use: No     Allergies   Erythromycin; Codeine; Erythromycin base; Penicillin g; Penicillins; and Zantac [ranitidine hcl]   Review of  Systems Review of Systems  Constitutional: Positive for fatigue. Negative for chills and fever.  HENT: Negative for congestion and rhinorrhea.   Eyes: Negative for visual disturbance.  Respiratory: Negative for cough, shortness of breath and wheezing.   Cardiovascular: Negative for chest pain and leg swelling.  Gastrointestinal: Positive for nausea. Negative for abdominal pain, diarrhea and vomiting.  Genitourinary: Negative for dysuria and flank pain.  Musculoskeletal: Negative for neck pain and neck stiffness.  Skin: Negative for rash and wound.  Allergic/Immunologic: Negative for immunocompromised state.  Neurological: Positive for weakness and light-headedness. Negative for syncope and headaches.  All other systems reviewed and are negative.    Physical Exam Updated Vital Signs BP (!) 117/94   Pulse (!) 43   Temp (!) 97.5 F (36.4 C) (Oral)   Resp 13   SpO2 100%   Physical Exam  Constitutional: She is oriented to person, place, and time. She appears well-developed.  Chronically ill-appearing  HENT:  Head: Normocephalic and atraumatic.  Mildly dry MM  Eyes: Conjunctivae are normal.  Neck: Neck supple.  Cardiovascular: Normal heart sounds. An irregularly irregular rhythm present. Bradycardia present. Exam reveals no friction rub.  No murmur heard. Pulmonary/Chest: Effort normal and breath sounds normal. No respiratory distress. She has no wheezes. She has no rales.  Abdominal: She exhibits no distension.  Musculoskeletal: She exhibits no edema.  Neurological: She is alert and oriented to person, place, and time. She exhibits normal muscle tone.  Skin: Skin is warm. Capillary refill takes less than 2 seconds.  Psychiatric: She has a normal mood and affect.  Nursing note and vitals reviewed.    ED Treatments / Results  Labs (all labs ordered are listed, but only abnormal results are displayed) Labs Reviewed  CBC WITH DIFFERENTIAL/PLATELET - Abnormal; Notable for the  following components:      Result Value   MCV 104.8 (*)    RDW 15.9 (*)    All other components within normal limits  COMPREHENSIVE METABOLIC PANEL - Abnormal; Notable  for the following components:   CO2 20 (*)    Glucose, Bld 134 (*)    BUN 30 (*)    Creatinine, Ser 4.25 (*)    Calcium 7.8 (*)    Albumin 3.2 (*)    AST 50 (*)    Alkaline Phosphatase 132 (*)    GFR calc non Af Amer 9 (*)    GFR calc Af Amer 10 (*)    Anion gap 16 (*)    All other components within normal limits  I-STAT CHEM 8, ED - Abnormal; Notable for the following components:   BUN 33 (*)    Creatinine, Ser 4.50 (*)    Glucose, Bld 138 (*)    Calcium, Ion 0.89 (*)    TCO2 21 (*)    Hemoglobin 15.3 (*)    All other components within normal limits  CULTURE, BLOOD (SINGLE)  URINE CULTURE  MRSA PCR SCREENING  TSH  T4, FREE  CORTISOL-AM, BLOOD  URINALYSIS, ROUTINE W REFLEX MICROSCOPIC  HEMOGLOBIN A1C  LIPID PANEL  TROPONIN I  TROPONIN I  TROPONIN I  BASIC METABOLIC PANEL  CBC  I-STAT TROPONIN, ED    EKG None  Radiology Dg Chest Portable 1 View  Result Date: 08/23/2018 CLINICAL DATA:  Hypotension and bradycardia EXAM: PORTABLE CHEST 1 VIEW COMPARISON:  07/05/2018.  Chest CT 10/05/2017 FINDINGS: Chronic left pleural effusion and lower lobe collapse based on 2018 and 2019 chest CTs. The remainder of the lungs are clear. Normal heart size and mediastinal contours. IMPRESSION: Chronic left lower lobe collapse and small left effusion. No acute finding compared to priors. Electronically Signed   By: Monte Fantasia M.D.   On: 08/23/2018 19:33    Procedures .Critical Care Performed by: Duffy Bruce, MD Authorized by: Duffy Bruce, MD   Critical care provider statement:    Critical care time (minutes):  35   Critical care time was exclusive of:  Separately billable procedures and treating other patients and teaching time   Critical care was necessary to treat or prevent imminent or  life-threatening deterioration of the following conditions:  Circulatory failure and cardiac failure   Critical care was time spent personally by me on the following activities:  Development of treatment plan with patient or surrogate, discussions with consultants, evaluation of patient's response to treatment, examination of patient, obtaining history from patient or surrogate, ordering and performing treatments and interventions, ordering and review of laboratory studies, ordering and review of radiographic studies, pulse oximetry, re-evaluation of patient's condition and review of old charts   I assumed direction of critical care for this patient from another provider in my specialty: no     (including critical care time)  Medications Ordered in ED Medications  atropine injection 0.4 mg (has no administration in time range)  midodrine (PROAMATINE) tablet 10 mg (has no administration in time range)  hydrocortisone sodium succinate (SOLU-CORTEF) 100 MG injection 50 mg (has no administration in time range)  heparin injection 5,000 Units (has no administration in time range)  acetaminophen (TYLENOL) tablet 650 mg (has no administration in time range)    Or  acetaminophen (TYLENOL) suppository 650 mg (has no administration in time range)  ondansetron (ZOFRAN) tablet 4 mg (has no administration in time range)    Or  ondansetron (ZOFRAN) injection 4 mg (has no administration in time range)  zolpidem (AMBIEN) tablet 5 mg (has no administration in time range)  allopurinol (ZYLOPRIM) tablet 100 mg (has no administration in time range)  aspirin  chewable tablet 81 mg (has no administration in time range)  diazepam (VALIUM) tablet 5 mg (has no administration in time range)  loperamide (IMODIUM) capsule 2 mg (has no administration in time range)  Melatonin TABS 6 mg (has no administration in time range)  multivitamin (RENA-VIT) tablet 1 tablet (has no administration in time range)  loratadine  (CLARITIN) tablet 10 mg (has no administration in time range)  dimethicone 1 % cream 1 application (has no administration in time range)  dorzolamide-timolol (COSOPT) 22.3-6.8 MG/ML ophthalmic solution 1 drop (has no administration in time range)  hydroxypropyl methylcellulose / hypromellose (ISOPTO TEARS / GONIOVISC) 2.5 % ophthalmic solution 1 drop (has no administration in time range)  sodium chloride 0.9 % bolus 1,000 mL (0 mLs Intravenous Stopped 08/23/18 2101)  midodrine (PROAMATINE) tablet 10 mg (10 mg Oral Given 08/23/18 2048)  atropine injection 0.2 mg (0.2 mg Intravenous Given 08/23/18 2049)     Initial Impression / Assessment and Plan / ED Course  I have reviewed the triage vital signs and the nursing notes.  Pertinent labs & imaging results that were available during my care of the patient were reviewed by me and considered in my medical decision making (see chart for details).     82 year old female here with generalized weakness after dialysis.  On arrival, patient markedly hypotensive and bradycardic.  Her lab work, however, is overall reassuring.  She has no evidence to suggest sepsis with no fever, normal white count, and no apparent infectious etiology.  Of note, patient has a history of bradycardia but was in the 50s during recent admission and is low as the low 30s today.  Following a dose of her Midrin and fluids, blood pressure has improved but she remains severely fatigued.  I trialed a low dose of atropine, which increased her heart rate briefly to the mid 50s which did seem to improve her symptoms.  Discussed case with cardiology fellow on-call.  He is unsure if she will be a candidate for further intervention given her complex vascular history, he agrees with plan to admit for observation, telemetry, and reassessment by EP in the morning.  Final Clinical Impressions(s) / ED Diagnoses   Final diagnoses:  Symptomatic bradycardia  Hypotension, unspecified hypotension type     ED Discharge Orders    None       Duffy Bruce, MD 08/23/18 2841    Duffy Bruce, MD 09/05/18 [phone]

## 2018-08-23 NOTE — Progress Notes (Signed)
Received report from [facility] in the ED.

## 2018-08-23 NOTE — H&P (Addendum)
History and Physical    [name] James HMC:[id number] DOB: [birth year] DOA: 08/23/2018  Referring MD/NP/PA:   PCP: [facility], [place]   Patient coming from:  The patient is coming from SNF.  At baseline, pt is dependent for most of ADL.     Chief Complaint: Generalized weakness, bradycardia, hypotension  HPI: [name] James is a 82 y.o. female with medical history significant of hypertension, hyperlipidemia, diabetes mellitus, GERD, gout, anxiety, ESRD-HD (MWF), atrial fibrillation not on anticoagulants, breast cancer (bilateral mastectomy 1982), dCHF, PAD, obesity, steal syndrome in the right upper extremity, who presents with generalized weakness, bradycardia, hypotension.  he patient states that she has been feeling generally weak for the last several days. Pt went to dialysis today. She states that she felt very weak and was going to pass put, but did not. She does not have unilateral numbness in extremities, no facial droop, slurred speech.  No vision change or hearing loss. She was found to have hypotension with SBP 60-70s and bradycardia with heart rate down to 30s.  EMS was subsequently called and she was told to come to the ED. Pt was treated with 0.2 mg of atropine and 1L of NS bolus in ED. Her HR is back to 40-60s, and SBP comes back to upper 80s-lower 90s. She is oriented x3.  She states that she has mild dry cough and mild shortness of breath, no chest pain.  She has nausea, no vomiting, diarrhea or abdominal pain.  No symptoms of UTI.  No fever or chills.  Of note, she was hospitalized from 7/15-7/18 due to atypical chest pain and junctional bradycardia. Her beta-blocker was held as well as her amiodarone. She was viewed a very difficult candidate for a pacemaker given her history of multiple surgeries.  ED Course: pt was found to have negative troponin, WBC 7.4, potassium 4.3, bicarbonate 20, creatinine 4.50, BUN 33, temperature 97.3, bradycardia, no tachypnea, oxygen saturation 100% on 2 L  nasal cannula oxygen.  Chest x-ray showed left lower lobe collapse and a small left pleural effusion.  Patient is placed on stepdown bed for observation.  ED physician consulted on-call cardiology fellow.  Review of Systems:   General: no fevers, chills, has poor appetite, has fatigue HEENT: no blurry vision, hearing changes or sore throat Respiratory: has dyspnea, mild coughing, no wheezing CV: no chest pain, no palpitations GI: no nausea, vomiting, abdominal pain, diarrhea, constipation GU: no dysuria, burning on urination, increased urinary frequency, hematuria  Ext: has leg edema Neuro: no unilateral weakness, numbness, or tingling, no vision change or hearing loss Skin: no rash, no skin tear. MSK: No muscle spasm, no deformity, no limitation of range of movement in spin Heme: No easy bruising.  Travel history: No recent long distant travel.  Allergy:  Allergies  Allergen Reactions  . Erythromycin Swelling  . Codeine Palpitations, Rash and Other (See Comments)    GI Upset  . Erythromycin Base Other (See Comments)    GI Upset  . Penicillin G Rash    Has patient had a PCN reaction causing immediate rash, facial/tongue/throat swelling, SOB or lightheadedness with hypotension: Yes Has patient had a PCN reaction causing severe rash involving mucus membranes or skin necrosis: Unk Has patient had a PCN reaction that required hospitalization: Unk Has patient had a PCN reaction occurring within the last 10 years: Unk If all of the above answers are "NO", then may proceed with Cephalosporin use..  . Penicillins Rash and Other (See Comments)  Has taken Keflex & Rocephin many times without problems Has patient had a PCN reaction causing immediate rash, facial/tongue/throat swelling, SOB or lightheadedness with hypotension: Yes Has patient had a PCN reaction causing severe rash involving mucus membranes or skin necrosis: Unk Has patient had a PCN reaction that required hospitalization:  Unk Has patient had a PCN reaction occurring within the last 10 years: Unk If all of the above answers are "NO", then may proceed with Cephalosporin use.   [name] James [Ranitidine Hcl] Nausea And Vomiting    Past Medical History:  Diagnosis Date  . Anemia, chronic disease   . Anxiety   . Arthritis    knees  . Atrial fibrillation ([place])   . Barrett esophagus   . Benign paroxysmal positional vertigo   . Breast cancer ([place]) 1980s   bil mastectomies, no radiation or chemo  . Cataract   . Chest pain, atypical 12/05/2008   R/Lmv- normal perfusion all regions, noe ECG changes   . CHF (congestive heart failure) ([place]) 05/13/2011   echo - YY>48%; stage 1 diastolic dysfunction; elevated LV filling pressure, MAC  . CKD (chronic kidney disease), stage III ([place])   . Claudication ([place]) 10/30/2005   doppler - normal evaluation, no evidence of aneurysm, diameter reduction, dissection, compression or vascular abnormality)  . Complication of anesthesia   . Dyslipidemia   . Dysrhythmia    Atrial Fibrillation  . Edema   . ESRD (end stage renal disease) on dialysis ([place])    "[facility]; [address]; MWF" (07/06/2018)  . Family history of adverse reaction to anesthesia    son also has nausea   . Fever blister 12/2016  . Gastritis and gastroduodenitis   . GERD (gastroesophageal reflux disease)   . Gout   . Hemodialysis-associated hypotension   . HLD (hyperlipidemia)   . Hypertension   . IBS (irritable bowel syndrome)   . Insomnia   . Morbid obesity ([place])   . On home oxygen therapy    "2L; 24/7" (07/06/2018)  . PAF (paroxysmal atrial fibrillation) ([place])   . Peripheral neuropathy   . Pneumonia 12/2016  . PONV (postoperative nausea and vomiting)   . Renal cyst   . Renal insufficiency 10/30/2005   doppler - abn resistance consistent w/ parenchymal disease  . Renovascular hypertension   . Type 2 diabetes mellitus with diabetic neuropathy, with long-term current use of insulin ([place])   . Vertigo      Past Surgical History:  Procedure Laterality Date  . A/V FISTULAGRAM Left 03/19/2018   Procedure: A/V FISTULAGRAM;  Surgeon: Angelia Mould, MD;  Location: [place] [facility];  Service: Cardiovascular;  Laterality: Left;  . A/V SHUNTOGRAM Left 01/19/2018   Procedure: A/V SHUNTOGRAM;  Surgeon: Serafina Mitchell, MD;  Location: [place] [facility];  Service: Cardiovascular;  Laterality: Left;  . ANGIOPLASTY Left 02/12/2015   Procedure: ANGIOPLASTY;  Surgeon: Angelia Mould, MD;  Location: [facility];  Service: Cardiovascular;  Laterality: Left;  AVF  . AV FISTULA PLACEMENT Left 11/30/2014   Procedure: ARTERIOVENOUS (AV) FISTULA CREATION LEFT ARM;  Surgeon: Angelia Mould, MD;  Location: [place];  Service: Vascular;  Laterality: Left;  . AV FISTULA PLACEMENT Left 03/20/2015   Procedure:  Inserton of Left Upper Arm Gortex Graft;  Surgeon: Angelia Mould, MD;  Location: [place] [facility];  Service: Vascular;  Laterality: Left;  . AV FISTULA PLACEMENT Left 04/12/2015   Procedure: INSERTION OF LEFT ARM  ARTERIOVENOUS GORE-TEX GRAFT ;  Surgeon: Harrell Gave  Nicole Cella, MD;  Location: [facility];  Service: Vascular;  Laterality: Left;  . AV FISTULA PLACEMENT Right 06/08/2018   Procedure: INSERTION OF ARTERIOVENOUS (AV) GORE-TEX GRAFT ARM USING 4-7MM X 45CM GORETEX GRAFT;  Surgeon: Angelia Mould, MD;  Location: [facility];  Service: Vascular;  Laterality: Right;  . BACK SURGERY  ~1980  . CATARACT EXTRACTION Right   . CHOLECYSTECTOMY    . ESOPHAGOGASTRODUODENOSCOPY N/A 10/24/2014   Procedure: ESOPHAGOGASTRODUODENOSCOPY (EGD);  Surgeon: Jerene Bears, MD;  Location: [facility] ENDOSCOPY;  Service: Endoscopy;  Laterality: N/A;  . EXCHANGE OF A DIALYSIS CATHETER Left 11/30/2014   Procedure: EXCHANGE OF A DIALYSIS CATHETER, LEFT INTERNAL JUGULAR;  Surgeon: Angelia Mould, MD;  Location: [place];  Service: Vascular;  Laterality: Left;  . EYE SURGERY Left    transplant  . FISTULOGRAM N/A 02/12/2015    Procedure: FISTULOGRAM;  Surgeon: Angelia Mould, MD;  Location: [facility] CATH LAB;  Service: Cardiovascular;  Laterality: N/A;  . HEMORRHOID SURGERY    . LIGATION OF ARTERIOVENOUS  FISTULA Left 03/20/2015   Procedure: LIGATION OF ARTERIOVENOUS  FISTULA;  Surgeon: Angelia Mould, MD;  Location: [place];  Service: Vascular;  Laterality: Left;  Marland Kitchen MASTECTOMY Bilateral ~1982   bil breast reconstruction with implants  . ORIF TIBIA & FIBULA FRACTURES Left 2007   also had left non displaced malleolar fracture.   Marland Kitchen PERIPHERAL VASCULAR BALLOON ANGIOPLASTY Left 01/19/2018   Procedure: PERIPHERAL VASCULAR BALLOON ANGIOPLASTY;  Surgeon: Serafina Mitchell, MD;  Location: [place] [facility];  Service: Cardiovascular;  Laterality: Left;  fistula  . PERIPHERAL VASCULAR BALLOON ANGIOPLASTY Left 03/19/2018   Procedure: PERIPHERAL VASCULAR BALLOON ANGIOPLASTY;  Surgeon: Angelia Mould, MD;  Location: [place] [facility];  Service: Cardiovascular;  Laterality: Left;  UPPER ARM FISTULA  . REMOVAL OF GRAFT Left 04/12/2015   Procedure: REMOVAL OF LEFT ARM ARTERIOVENOUS GORE-TEX GRAFT;  Surgeon: Angelia Mould, MD;  Location: [place];  Service: Vascular;  Laterality: Left;    Social History:  reports that she has never smoked. She has never used smokeless tobacco. She reports that she does not drink alcohol or use drugs.  Family History:  Family History  Problem Relation Age of Onset  . Diabetes Mother   . Heart disease Mother   . Non-Hodgkin's lymphoma Mother   . Heart attack Father   . Stroke Father   . Parkinson's disease Brother   . Lung disease Brother   . Heart disease Brother   . Multiple myeloma Sister      Prior to Admission medications   Medication Sig Start Date End Date Taking? Authorizing Provider  acetaminophen (TYLENOL) 325 MG tablet Take 650 mg by mouth every 6 (six) hours as needed for fever ("for vomiting/nausea," per [facility]).     [provider]  allopurinol  (ZYLOPRIM) 100 MG tablet Take 100 mg by mouth daily.    [provider]  aspirin 81 MG chewable tablet Chew 81 mg by mouth daily.    [provider]  B Complex-C-Folic Acid (NEPHRO VITAMINS) 0.8 MG TABS Take 1 tablet by mouth daily.    [provider]  diazepam (VALIUM) 5 MG tablet Take 1 tablet (5 mg total) by mouth at bedtime. 07/08/18   Arrien, Jimmy Picket, MD  DIMETHICONE, TOPICAL, 5 % CREA Apply 1 application topically See admin instructions. Place a nickel-sized amount onto buttock(s) area 2 times a day for barrier protection    [provider]  dorzolamide-timolol (  COSOPT) 22.3-6.8 MG/ML ophthalmic solution Place 1 drop into the left eye 2 (two) times daily. Wait 2-5 minutes when giving two eye meds    [provider]  loperamide (IMODIUM A-D) 2 MG tablet Take 2 mg by mouth 3 (three) times daily as needed (for diarrhea).     [provider]  loratadine (CLARITIN) 10 MG tablet Take 10 mg by mouth daily.    [provider]  Melatonin 5 MG TABS Take 5 mg by mouth at bedtime.     [provider]  midodrine (PROAMATINE) 10 MG tablet Take 10 mg by mouth See admin instructions. Take 10 mg by mouth WITH MORNING MEDS AT FACILITY on Mon/Wed/Fri- dialysis days    [provider]  oxyCODONE (ROXICODONE) 5 MG immediate release tablet Take 1 tablet (5 mg total) by mouth every 4 (four) hours as needed for severe pain. Patient not taking: Reported on 08/09/2018 07/08/18   Arrien, Jimmy Picket, MD  OXYGEN Inhale 2 L into the lungs continuous.     [provider]  Polyethyl Glycol-Propyl Glycol (SYSTANE ULTRA) 0.4-0.3 % SOLN Place 1 drop into both eyes 3 (three) times daily.    [provider]  promethazine (PHENERGAN) 25 MG tablet Take 25 mg by mouth every 8 (eight) hours as needed for nausea or vomiting.    [provider]    Physical Exam: Vitals:   08/23/18 2045 08/23/18 2103 08/23/18 2116  08/23/18 2133  BP: (!) 86/54 121/72 (!) 76/66 (!) 89/68  Pulse: (!) 42 (!) 44 (!) 46 (!) 47  Resp: 18 16 17 16   Temp:      SpO2: 100% 100% 100% 100%   General: Not in acute distress HEENT:       Eyes: PERRL, EOMI, no scleral icterus.       ENT: No discharge from the ears and nose, no pharynx injection, no tonsillar enlargement.        Neck: No JVD, no bruit, no mass felt. Heme: No neck lymph node enlargement. Cardiac: S1/S2, RRR, No murmurs, No gallops or rubs. Respiratory: No rales, wheezing, rhonchi or rubs. GI: Soft, nondistended, nontender, no rebound pain, no organomegaly, BS present. GU: No hematuria Ext: trace pitting leg edema bilaterally. 2+DP/PT pulse bilaterally. Musculoskeletal: No joint deformities, No joint redness or warmth, no limitation of ROM in spin. Skin: No rashes.  Neuro: Alert, oriented X3, cranial nerves II-XII grossly intact, moves all extremities normally.  Psych: Patient is not psychotic, no suicidal or hemocidal ideation.  Labs on Admission: I have personally reviewed following labs and imaging studies  CBC: Recent Labs  Lab 08/23/18 1850 08/23/18 1859  WBC 7.4  --   NEUTROABS 4.0  --   HGB 13.8 15.3*  HCT 43.5 45.0  MCV 104.8*  --   PLT 158  --    Basic Metabolic Panel: Recent Labs  Lab 08/23/18 1850 08/23/18 1859  NA 135 135  K 4.5 4.3  CL 99 102  CO2 20*  --   GLUCOSE 134* 138*  BUN 30* 33*  CREATININE 4.25* 4.50*  CALCIUM 7.8*  --    GFR: CrCl cannot be calculated (Unknown ideal weight.). Liver Function Tests: Recent Labs  Lab 08/23/18 1850  AST 50*  ALT 27  ALKPHOS 132*  BILITOT 0.7  PROT 7.6  ALBUMIN 3.2*   No results for input(s): LIPASE, AMYLASE in the last 168 hours. No results for input(s): AMMONIA in the last 168 hours. Coagulation Profile: No results for  input(s): INR, PROTIME in the last 168 hours. Cardiac Enzymes: No results for input(s): CKTOTAL, CKMB, CKMBINDEX, TROPONINI in the last 168 hours. BNP (last  3 results) No results for input(s): PROBNP in the last 8760 hours. HbA1C: No results for input(s): HGBA1C in the last 72 hours. CBG: No results for input(s): GLUCAP in the last 168 hours. Lipid Profile: No results for input(s): CHOL, HDL, LDLCALC, TRIG, CHOLHDL, LDLDIRECT in the last 72 hours. Thyroid Function Tests: Recent Labs    08/23/18 1850  TSH 1.417  FREET4 0.88   Anemia Panel: No results for input(s): VITAMINB12, FOLATE, FERRITIN, TIBC, IRON, RETICCTPCT in the last 72 hours. Urine analysis:    Component Value Date/Time   COLORURINE YELLOW 10/26/2014 2043   APPEARANCEUR CLOUDY (A) 10/26/2014 2043   LABSPEC 1.011 10/26/2014 2043   PHURINE 5.0 10/26/2014 2043   GLUCOSEU 100 (A) 10/26/2014 2043   HGBUR SMALL (A) 10/26/2014 2043   BILIRUBINUR NEGATIVE 10/26/2014 2043   BILIRUBINUR neg 04/02/2014 0848   KETONESUR NEGATIVE 10/26/2014 2043   PROTEINUR 100 (A) 10/26/2014 2043   UROBILINOGEN 0.2 10/26/2014 2043   NITRITE POSITIVE (A) 10/26/2014 2043   LEUKOCYTESUR MODERATE (A) 10/26/2014 2043   Sepsis Labs: @LABRCNTIP (procalcitonin:4,lacticidven:4) ) Recent Results (from the past 240 hour(s))  Culture, blood (single)     Status: None (Preliminary result)   Collection Time: 08/23/18  6:52 PM  Result Value Ref Range Status   Specimen Description BLOOD LEFT EJ  Final   Special Requests   Final    BOTTLES DRAWN AEROBIC AND ANAEROBIC Blood Culture adequate volume Performed at [facility], [place] [address] [place], [place] [address]    Culture PENDING  Incomplete   Report Status PENDING  Incomplete     Radiological Exams on Admission: Dg Chest Portable 1 View  Result Date: 08/23/2018 CLINICAL DATA:  Hypotension and bradycardia EXAM: PORTABLE CHEST 1 VIEW COMPARISON:  07/05/2018.  Chest CT 10/05/2017 FINDINGS: Chronic left pleural effusion and lower lobe collapse based on 2018 and 2019 chest CTs. The remainder of the lungs are clear. Normal heart size and mediastinal  contours. IMPRESSION: Chronic left lower lobe collapse and small left effusion. No acute finding compared to priors. Electronically Signed   By: Monte Fantasia M.D.   On: 08/23/2018 19:33     EKG: Independently reviewed.  Junctional bradycardia, PAC, QTC 409,  Assessment/Plan Principal Problem:   Hypotension Active Problems:   DM type 2, uncontrolled, with renal complications (HCC)   ESRD on dialysis (HCC)   CHF (congestive heart failure) (HCC)   Paroxysmal atrial fibrillation (HCC)   Bradycardia   Gout   Anxiety   Hypotension: Most likely due to junctional bradycardia in the setting ESRD. Blood pressure responded to atropine and IV fluid.  Patient was given 0.2 mg of atropine and a 1 L normal saline bolus, heart rate is up to 40-50s and SBP is upper 80 to lower 90s. Will also need to r/o adrenal insufficiency. EDP discussed case with cardiology fellow on-call.  "He is unsure if she will be a candidate for further intervention given her complex vascular history, he agrees with plan to admit for observation, telemetry, and reassessment by EP in the morning".   -will place in SDU for obs -prn atropine 0.5 mg for symptomatic bradycardia -pacer pad at bed side -Check cortisol level, and give 50 mg of Solu-Cortef -Midodrin 10 mg 3 times daily  Paroxysmal atrial fibrillation and junctional bradycardia: in recent admission, pt had junctional bradycardia.  Her beta-blocker was held as well as her amiodarone. Per discharge summary, patient was seen by electrophysiology, with recommendations to continue to avoid AV blockade, close monitoring of heart rate.  Likely patient not candidate for invasive device, due to high risk of infection and difficulty with access given her end-stage renal disease and dialysis access difficulties". CHA2DS2-VASc Score is 7, needs oral anticoagulation., but pt is not on AC at home possibly due high risk of GIB due to heme-positive stool.  -tele monitoring for  bradycardia -May need to consult EP again in AM -trop x 3  Diet controlled DM type 2, uncontrolled, with renal complications (ESRD): Last A1c 5.7 on 01/20/17, well controled. Patient is not taking meds at home. Blood sugar 138. 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 She sees Dr. [name].  She states that she has been having difficulty at dialysis with them accessing her graft.  They were ultimately actually able to access her graft and were able to complete a full session.  Potassium 4.3, bicarbonate 20, creatinine 4.50, BUN 33, - please call renal for HD.   Hx of CHF (congestive heart failure) ([place]): No 2D echo on record, not sure which type of CHF, but possibly diastolic congestive heart failure due to long history of hypertension.  Patient has trace leg edema, but no JVD.  CHF seems to be compensated. - Volume management per renal HD  Gout: -Allopurinol  History of hypertension: -Hold blood pressure medications due to hypotension  Anxiety: -Continue home Valium    DVT ppx: SQ Heparin     Code Status: Full code Family Communication: None at bed side. Disposition Plan:  Anticipate discharge back to previous SNF Consults called:  EDP consulted on-call card fellow Admission status:   SDU/obs  Date of Service 08/23/2018    Ivor Costa [facility] Pager [phone]  If 7PM-7AM, please contact night-coverage [email or web address] Password [email or web address] 08/23/2018, 10:00 PM

## 2018-08-23 NOTE — ED Notes (Signed)
Attempted report left number for callback

## 2018-08-23 NOTE — ED Notes (Signed)
I Stat Chem 8 result of I Ca 0.89 reported to Dr. Crissie Reese

## 2018-08-24 DIAGNOSIS — E1122 Type 2 diabetes mellitus with diabetic chronic kidney disease: Secondary | ICD-10-CM | POA: Diagnosis not present

## 2018-08-24 DIAGNOSIS — J45901 Unspecified asthma with (acute) exacerbation: Secondary | ICD-10-CM | POA: Diagnosis present

## 2018-08-24 DIAGNOSIS — Z9981 Dependence on supplemental oxygen: Secondary | ICD-10-CM | POA: Diagnosis not present

## 2018-08-24 DIAGNOSIS — R293 Abnormal posture: Secondary | ICD-10-CM | POA: Diagnosis not present

## 2018-08-24 DIAGNOSIS — Z9013 Acquired absence of bilateral breasts and nipples: Secondary | ICD-10-CM | POA: Diagnosis not present

## 2018-08-24 DIAGNOSIS — R531 Weakness: Secondary | ICD-10-CM | POA: Diagnosis not present

## 2018-08-24 DIAGNOSIS — M2550 Pain in unspecified joint: Secondary | ICD-10-CM | POA: Diagnosis not present

## 2018-08-24 DIAGNOSIS — N186 End stage renal disease: Secondary | ICD-10-CM | POA: Diagnosis present

## 2018-08-24 DIAGNOSIS — K219 Gastro-esophageal reflux disease without esophagitis: Secondary | ICD-10-CM | POA: Diagnosis present

## 2018-08-24 DIAGNOSIS — R498 Other voice and resonance disorders: Secondary | ICD-10-CM | POA: Diagnosis not present

## 2018-08-24 DIAGNOSIS — T82858A Stenosis of vascular prosthetic devices, implants and grafts, initial encounter: Secondary | ICD-10-CM | POA: Diagnosis not present

## 2018-08-24 DIAGNOSIS — I495 Sick sinus syndrome: Principal | ICD-10-CM

## 2018-08-24 DIAGNOSIS — Z9841 Cataract extraction status, right eye: Secondary | ICD-10-CM | POA: Diagnosis not present

## 2018-08-24 DIAGNOSIS — I130 Hypertensive heart and chronic kidney disease with heart failure and stage 1 through stage 4 chronic kidney disease, or unspecified chronic kidney disease: Secondary | ICD-10-CM | POA: Diagnosis not present

## 2018-08-24 DIAGNOSIS — J9611 Chronic respiratory failure with hypoxia: Secondary | ICD-10-CM | POA: Diagnosis present

## 2018-08-24 DIAGNOSIS — I132 Hypertensive heart and chronic kidney disease with heart failure and with stage 5 chronic kidney disease, or end stage renal disease: Secondary | ICD-10-CM | POA: Diagnosis present

## 2018-08-24 DIAGNOSIS — Z7401 Bed confinement status: Secondary | ICD-10-CM | POA: Diagnosis not present

## 2018-08-24 DIAGNOSIS — Z992 Dependence on renal dialysis: Secondary | ICD-10-CM | POA: Diagnosis not present

## 2018-08-24 DIAGNOSIS — D631 Anemia in chronic kidney disease: Secondary | ICD-10-CM | POA: Diagnosis not present

## 2018-08-24 DIAGNOSIS — Z853 Personal history of malignant neoplasm of breast: Secondary | ICD-10-CM | POA: Diagnosis not present

## 2018-08-24 DIAGNOSIS — N2581 Secondary hyperparathyroidism of renal origin: Secondary | ICD-10-CM | POA: Diagnosis present

## 2018-08-24 DIAGNOSIS — R001 Bradycardia, unspecified: Secondary | ICD-10-CM | POA: Diagnosis present

## 2018-08-24 DIAGNOSIS — E1129 Type 2 diabetes mellitus with other diabetic kidney complication: Secondary | ICD-10-CM | POA: Diagnosis not present

## 2018-08-24 DIAGNOSIS — E1151 Type 2 diabetes mellitus with diabetic peripheral angiopathy without gangrene: Secondary | ICD-10-CM | POA: Diagnosis present

## 2018-08-24 DIAGNOSIS — F419 Anxiety disorder, unspecified: Secondary | ICD-10-CM | POA: Diagnosis present

## 2018-08-24 DIAGNOSIS — Z7189 Other specified counseling: Secondary | ICD-10-CM | POA: Diagnosis not present

## 2018-08-24 DIAGNOSIS — T82898A Other specified complication of vascular prosthetic devices, implants and grafts, initial encounter: Secondary | ICD-10-CM | POA: Diagnosis not present

## 2018-08-24 DIAGNOSIS — T82868A Thrombosis of vascular prosthetic devices, implants and grafts, initial encounter: Secondary | ICD-10-CM | POA: Diagnosis not present

## 2018-08-24 DIAGNOSIS — E1165 Type 2 diabetes mellitus with hyperglycemia: Secondary | ICD-10-CM | POA: Diagnosis not present

## 2018-08-24 DIAGNOSIS — I959 Hypotension, unspecified: Secondary | ICD-10-CM | POA: Diagnosis not present

## 2018-08-24 DIAGNOSIS — E785 Hyperlipidemia, unspecified: Secondary | ICD-10-CM | POA: Diagnosis present

## 2018-08-24 DIAGNOSIS — Z515 Encounter for palliative care: Secondary | ICD-10-CM | POA: Diagnosis not present

## 2018-08-24 DIAGNOSIS — I5032 Chronic diastolic (congestive) heart failure: Secondary | ICD-10-CM | POA: Diagnosis present

## 2018-08-24 DIAGNOSIS — Z833 Family history of diabetes mellitus: Secondary | ICD-10-CM | POA: Diagnosis not present

## 2018-08-24 DIAGNOSIS — R079 Chest pain, unspecified: Secondary | ICD-10-CM | POA: Diagnosis not present

## 2018-08-24 DIAGNOSIS — Z8679 Personal history of other diseases of the circulatory system: Secondary | ICD-10-CM | POA: Diagnosis not present

## 2018-08-24 DIAGNOSIS — M109 Gout, unspecified: Secondary | ICD-10-CM | POA: Diagnosis present

## 2018-08-24 DIAGNOSIS — M6281 Muscle weakness (generalized): Secondary | ICD-10-CM | POA: Diagnosis not present

## 2018-08-24 DIAGNOSIS — Z9049 Acquired absence of other specified parts of digestive tract: Secondary | ICD-10-CM | POA: Diagnosis not present

## 2018-08-24 DIAGNOSIS — M8580 Other specified disorders of bone density and structure, unspecified site: Secondary | ICD-10-CM | POA: Diagnosis present

## 2018-08-24 DIAGNOSIS — I480 Paroxysmal atrial fibrillation: Secondary | ICD-10-CM | POA: Diagnosis present

## 2018-08-24 DIAGNOSIS — T82590A Other mechanical complication of surgically created arteriovenous fistula, initial encounter: Secondary | ICD-10-CM | POA: Diagnosis present

## 2018-08-24 DIAGNOSIS — Z7982 Long term (current) use of aspirin: Secondary | ICD-10-CM | POA: Diagnosis not present

## 2018-08-24 LAB — LIPID PANEL
Cholesterol: 157 mg/dL (ref 0–200)
HDL: 32 mg/dL — AB (ref >40)
LDL Cholesterol: 96 mg/dL (ref 0–99)
TRIGLYCERIDES: 147 mg/dL (ref <150)
Total CHOL/HDL Ratio: 4.9 RATIO
VLDL: 29 mg/dL (ref 0–40)

## 2018-08-24 LAB — CBC
HCT: 41.2 % (ref 36.0–46.0)
HEMOGLOBIN: 12.9 g/dL (ref 12.0–15.0)
MCH: 32.9 pg (ref 26.0–34.0)
MCHC: 31.3 g/dL (ref 30.0–36.0)
MCV: 105.1 fL — AB (ref 78.0–100.0)
PLATELETS: 150 10*3/uL (ref 150–400)
RBC: 3.92 MIL/uL (ref 3.87–5.11)
RDW: 15.9 % — ABNORMAL HIGH (ref 11.5–15.5)
WBC: 9.3 10*3/uL (ref 4.0–10.5)

## 2018-08-24 LAB — HEMOGLOBIN A1C
HEMOGLOBIN A1C: 4.4 % — AB (ref 4.8–5.6)
MEAN PLASMA GLUCOSE: 79.58 mg/dL

## 2018-08-24 LAB — BASIC METABOLIC PANEL
ANION GAP: 13 (ref 5–15)
BUN: 33 mg/dL — ABNORMAL HIGH (ref 8–23)
CHLORIDE: 103 mmol/L (ref 98–111)
CO2: 20 mmol/L — AB (ref 22–32)
CREATININE: 4.66 mg/dL — AB (ref 0.44–1.00)
Calcium: 7.6 mg/dL — ABNORMAL LOW (ref 8.9–10.3)
GFR calc non Af Amer: 8 mL/min — ABNORMAL LOW (ref >60)
GFR, EST AFRICAN AMERICAN: 9 mL/min — AB (ref >60)
Glucose, Bld: 170 mg/dL — ABNORMAL HIGH (ref 70–99)
POTASSIUM: 4.9 mmol/L (ref 3.5–5.1)
SODIUM: 136 mmol/L (ref 135–145)

## 2018-08-24 LAB — MRSA PCR SCREENING: MRSA by PCR: POSITIVE — AB

## 2018-08-24 LAB — GLUCOSE, CAPILLARY
Glucose-Capillary: 131 mg/dL — ABNORMAL HIGH (ref 70–99)
Glucose-Capillary: 163 mg/dL — ABNORMAL HIGH (ref 70–99)

## 2018-08-24 LAB — TROPONIN I: Troponin I: <0.03 ng/mL (ref <0.03)

## 2018-08-24 MED ORDER — DOXERCALCIFEROL 4 MCG/2ML IV SOLN
1.0000 ug | INTRAVENOUS | Status: DC
  Administered 2018-08-25 – 2018-08-28 (×2): 1 ug via INTRAVENOUS
  Filled 2018-08-24: qty 2

## 2018-08-24 MED ORDER — ATROPINE SULFATE 1 MG/10ML IJ SOSY
0.4000 mg | PREFILLED_SYRINGE | INTRAMUSCULAR | Status: DC | PRN
  Filled 2018-08-24: qty 10

## 2018-08-24 MED ORDER — CHLORHEXIDINE GLUCONATE CLOTH 2 % EX PADS
6.0000 | MEDICATED_PAD | Freq: Every day | CUTANEOUS | Status: DC
  Administered 2018-08-28: 6 via TOPICAL

## 2018-08-24 MED ORDER — SODIUM CHLORIDE 0.9 % IV SOLN
62.5000 mg | INTRAVENOUS | Status: DC
  Administered 2018-08-25: 62.5 mg via INTRAVENOUS
  Filled 2018-08-24 (×2): qty 5

## 2018-08-24 MED ORDER — MUPIROCIN 2 % EX OINT
1.0000 "application " | TOPICAL_OINTMENT | Freq: Two times a day (BID) | CUTANEOUS | Status: DC
  Administered 2018-08-24 – 2018-08-28 (×9): 1 via NASAL
  Filled 2018-08-24 (×3): qty 22

## 2018-08-24 NOTE — Progress Notes (Signed)
Schorr,NP notified about patient's HR continuously running in the upper 30's. BP stable and patient A&Ox4 and having a conversation with the RN. NP returned call and said not to give the PRN atropine unless the patient becomes symptomatic with her bradycardia (meaning confused, unresponsive, and diaphoretic). Patient has remained alert and oriented and stable at this time. Rapid response RN aware of patient situation.Will continue to monitor and treat per MD orders.

## 2018-08-24 NOTE — Progress Notes (Signed)
Patient positive for MRSA. Positive MRSA standing orders initiated. Will continue to monitor and treat per MD orders.

## 2018-08-24 NOTE — Consult Note (Deleted)
Chief Complaint: Patient was seen in consultation today for right forearm dialysis graft evaluation with possible intervention Chief Complaint  Patient presents with  . Hypotension  . Bradycardia   at the request of Dr Rosalia Hammers  Supervising Physician: Markus Daft  Patient Status: [facility] - In-pt  History of Present Illness: [name] James is a 82 y.o. female   ESRD Using RUA dialysis graft Slow flow; difficult canalization +pulse No thrill Presented to ED with hypotension and bradycardia Weakness   Request for Rt arm dialysis graft evaluation with possible intervention In IR 9/4  Per note 9/3:  ASSESSMENT/PLAN:  ESRD on HD M-W-F right forearm av loop graft with poor HD flow.  History of left UE graft with central venous occlusion and chronic left UE edema.               She will need a shuntogram of the right forearm loop graft with possible intervention if possible tomorrow AM since this is her only working access.  The planned ligation of the L graft is scheduled on 08/27/2018.    Scheduled for LUA ligation of dialysis graft; now for OR Fri with Dr Scot Dock Hx left innominate and subclavian vein balloon coated angioplasty 12/2017--    Past Medical History:  Diagnosis Date  . Anemia, chronic disease   . Anxiety   . Arthritis    knees  . Atrial fibrillation ([place])   . Barrett esophagus   . Benign paroxysmal positional vertigo   . Breast cancer ([place]) 1980s   bil mastectomies, no radiation or chemo  . Cataract   . Chest pain, atypical 12/05/2008   R/Lmv- normal perfusion all regions, noe ECG changes   . CHF (congestive heart failure) ([place]) 05/13/2011   echo - LN>98%; stage 1 diastolic dysfunction; elevated LV filling pressure, MAC  . CKD (chronic kidney disease), stage III ([place])   . Claudication ([place]) 10/30/2005   doppler - normal evaluation, no evidence of aneurysm, diameter reduction, dissection, compression or vascular abnormality)  . Complication of anesthesia   .  Dyslipidemia   . Dysrhythmia    Atrial Fibrillation  . Edema   . ESRD (end stage renal disease) on dialysis ([place])    "[facility]; [address]; MWF" (07/06/2018)  . Family history of adverse reaction to anesthesia    son also has nausea   . Fever blister 12/2016  . Gastritis and gastroduodenitis   . GERD (gastroesophageal reflux disease)   . Gout   . Hemodialysis-associated hypotension   . HLD (hyperlipidemia)   . Hypertension   . IBS (irritable bowel syndrome)   . Insomnia   . Morbid obesity ([place])   . On home oxygen therapy    "2L; 24/7" (07/06/2018)  . PAF (paroxysmal atrial fibrillation) ([place])   . Peripheral neuropathy   . Pneumonia 12/2016  . PONV (postoperative nausea and vomiting)   . Renal cyst   . Renal insufficiency 10/30/2005   doppler - abn resistance consistent w/ parenchymal disease  . Renovascular hypertension   . Type 2 diabetes mellitus with diabetic neuropathy, with long-term current use of insulin ([place])   . Vertigo     Past Surgical History:  Procedure Laterality Date  . A/V FISTULAGRAM Left 03/19/2018   Procedure: A/V FISTULAGRAM;  Surgeon: Angelia Mould, MD;  Location: [facility];  Service: Cardiovascular;  Laterality: Left;  . A/V SHUNTOGRAM Left 01/19/2018   Procedure: A/V SHUNTOGRAM;  Surgeon: Serafina Mitchell, MD;  Location: [facility];  Service: Cardiovascular;  Laterality: Left;  . ANGIOPLASTY Left 02/12/2015   Procedure: ANGIOPLASTY;  Surgeon: Christopher S Dickson, MD;  Location: [facility];  Service: Cardiovascular;  Laterality: Left;  AVF  . AV FISTULA PLACEMENT Left 11/30/2014   Procedure: ARTERIOVENOUS (AV) FISTULA CREATION LEFT ARM;  Surgeon: Christopher S Dickson, MD;  Location: [facility];  Service: Vascular;  Laterality: Left;  . AV FISTULA PLACEMENT Left 03/20/2015   Procedure:  Inserton of Left Upper Arm Gortex Graft;  Surgeon: Christopher S Dickson, MD;  Location: [facility];  Service: Vascular;  Laterality: Left;  . AV FISTULA  PLACEMENT Left 04/12/2015   Procedure: INSERTION OF LEFT ARM  ARTERIOVENOUS GORE-TEX GRAFT ;  Surgeon: Christopher S Dickson, MD;  Location: [facility];  Service: Vascular;  Laterality: Left;  . AV FISTULA PLACEMENT Right 06/08/2018   Procedure: INSERTION OF ARTERIOVENOUS (AV) GORE-TEX GRAFT ARM USING 4-7MM X 45CM GORETEX GRAFT;  Surgeon: Dickson, Christopher S, MD;  Location: [facility];  Service: Vascular;  Laterality: Right;  . BACK SURGERY  ~1980  . CATARACT EXTRACTION Right   . CHOLECYSTECTOMY    . ESOPHAGOGASTRODUODENOSCOPY N/A 10/24/2014   Procedure: ESOPHAGOGASTRODUODENOSCOPY (EGD);  Surgeon: Jay M Pyrtle, MD;  [facility];  Service: Endoscopy;  Laterality: N/A;  . EXCHANGE OF A DIALYSIS CATHETER Left 11/30/2014   Procedure: EXCHANGE OF A DIALYSIS CATHETER, LEFT INTERNAL JUGULAR;  Surgeon: Christopher S Dickson, MD;  [facility];  Service: Vascular;  Laterality: Left;  . EYE SURGERY Left    transplant  . FISTULOGRAM N/A 02/12/2015   Procedure: FISTULOGRAM;  Surgeon: Christopher S Dickson, MD;  [facility];  Service: Cardiovascular;  Laterality: N/A;  . HEMORRHOID SURGERY    . LIGATION OF ARTERIOVENOUS  FISTULA Left 03/20/2015   Procedure: LIGATION OF ARTERIOVENOUS  FISTULA;  Surgeon: Christopher S Dickson, MD;  Location[facility];  Service: Vascular;  Laterality: Left;  . MASTECTOMY Bilateral ~1982   bil breast reconstruction with implants  . ORIF TIBIA & FIBULA FRACTURES Left 2007   also had left non displaced malleolar fracture.   . PERIPHERAL VASCULAR BALLOON ANGIOPLASTY Left 01/19/2018   Procedure: PERIPHERAL VASCULAR BALLOON ANGIOPLASTY;  Surgeon: Brabham, Vance W, MD;  [facility];  Service: Cardiovascular;  Laterality: Left;  fistula  . PERIPHERAL VASCULAR BALLOON ANGIOPLASTY Left 03/19/2018   Procedure: PERIPHERAL VASCULAR BALLOON ANGIOPLASTY;  Surgeon: Dickson, Christopher S, MD;  [facility];  Service: Cardiovascular;  Laterality:  Left;  UPPER ARM FISTULA  . REMOVAL OF GRAFT Left 04/12/2015   Procedure: REMOVAL OF LEFT ARM ARTERIOVENOUS GORE-TEX GRAFT;  Surgeon: Christopher S Dickson, MD;  [facility];  Service: Vascular;  Laterality: Left;    Allergies: Erythromycin; Codeine; Erythromycin base; Penicillin g; Penicillins; and Zantac [ranitidine hcl]  Medications: Prior to Admission medications   Medication Sig Start Date End Date Taking? Authorizing Provider  acetaminophen (TYLENOL) 325 MG tablet Take 650 mg by mouth every 6 (six) hours as needed for fever ("for vomiting/nausea," per MAR).    Yes [provider]  allopurinol (ZYLOPRIM) 100 MG tablet Take 100 mg by mouth daily.   Yes [provider]  aspirin 81 MG chewable tablet Chew 81 mg by mouth daily.   Yes [provider]  B Complex-C-Folic Acid (NEPHRO VITAMINS) 0.8 MG TABS Take 1 tablet by mouth daily.   Yes [provider]  diazepam (VALIUM) 5 MG tablet Take 1 tablet (5 mg total) by mouth   at bedtime. 07/08/18  Yes Arrien, Jimmy Picket, MD  DIMETHICONE, TOPICAL, 5 % CREA Apply 1 application topically See admin instructions. Place a nickel-sized amount onto buttock(s) area 2 times a day for barrier protection   Yes [provider]  dorzolamide-timolol (COSOPT) 22.3-6.8 MG/ML ophthalmic solution Place 1 drop into the left eye 2 (two) times daily. Wait 2-5 minutes when giving two eye meds   Yes [provider]  hydrALAZINE (APRESOLINE) 10 MG tablet Take 5 mg by mouth 3 (three) times daily as needed (hypertension).   Yes [provider]  loperamide (IMODIUM A-D) 2 MG tablet Take 2 mg by mouth 3 (three) times daily as needed (for diarrhea).    Yes [provider]  loratadine (CLARITIN) 10 MG tablet Take 10 mg by mouth daily.   Yes [provider]  Melatonin 5 MG TABS Take 6 mg by mouth at bedtime.    Yes [provider]  midodrine (PROAMATINE) 10 MG tablet Take 10 mg by mouth  See admin instructions. Take 10 mg by mouth WITH MORNING MEDS AT FACILITY on Mon/Wed/Fri- dialysis days Take 10 mg by mouth with dialysis on Mon/Wed/Fri   Yes [provider]  OXYGEN Inhale 2 L into the lungs continuous.    Yes [provider]  Polyethyl Glycol-Propyl Glycol (SYSTANE ULTRA) 0.4-0.3 % SOLN Place 1 drop into both eyes 3 (three) times daily.   Yes [provider]  promethazine (PHENERGAN) 25 MG tablet Take 25 mg by mouth every 8 (eight) hours as needed for nausea or vomiting.   Yes [provider]  oxyCODONE (ROXICODONE) 5 MG immediate release tablet Take 1 tablet (5 mg total) by mouth every 4 (four) hours as needed for severe pain. Patient not taking: Reported on 08/09/2018 07/08/18   Arrien, Jimmy Picket, MD     Family History  Problem Relation Age of Onset  . Diabetes Mother   . Heart disease Mother   . Non-Hodgkin's lymphoma Mother   . Heart attack Father   . Stroke Father   . Parkinson's disease Brother   . Lung disease Brother   . Heart disease Brother   . Multiple myeloma Sister     Social History   Socioeconomic History  . Marital status: Divorced    Spouse name: Not on file  . Number of children: Not on file  . Years of education: Not on file  . Highest education level: Not on file  Occupational History  . Not on file  Social Needs  . Financial resource strain: Not on file  . Food insecurity:    Worry: Not on file    Inability: Not on file  . Transportation needs:    Medical: Not on file    Non-medical: Not on file  Tobacco Use  . Smoking status: Never Smoker  . Smokeless tobacco: Never Used  Substance and Sexual Activity  . Alcohol use: No  . Drug use: No  . Sexual activity: Not on file  Lifestyle  . Physical activity:    Days per week: Not on file    Minutes per session: Not on file  . Stress: Not on file  Relationships  . Social connections:    Talks on phone: Not on file    Gets together: Not on  file    Attends religious service: Not on file    Active member of club or organization: Not on file    Attends meetings of clubs or organizations: Not on  file    Relationship status: Not on file  Other Topics Concern  . Not on file  Social History Narrative   [facility] (10/06/17):   Originally from [place]. Has always lived in [place]. She currently lives in [place]. No bird exposure. [place] Previously traveled to [place], [place], & [place]. Previously worked in a nursery and also a dry cleaner.     Review of Systems: A 12 point ROS discussed and pertinent positives are indicated in the HPI above.  All other systems are negative.  Review of Systems  Constitutional: Positive for activity change and fatigue. Negative for fever.  Respiratory: Negative for cough and shortness of breath.   Cardiovascular: Negative for chest pain.  Gastrointestinal: Negative for abdominal pain.  Neurological: Positive for weakness.  Psychiatric/Behavioral: Negative for behavioral problems and confusion.    Vital Signs: BP (!) 87/29 (BP Location: Left Leg)   Pulse (!) 40   Temp (!) 97.3 F (36.3 C) (Oral)   Resp 19   SpO2 100%   Physical Exam  Cardiovascular: Normal rate and regular rhythm.  Pulmonary/Chest: Effort normal and breath sounds normal.  Abdominal: Soft. Bowel sounds are normal.  Musculoskeletal:  Left arm swelling Ecchymotic at dialysis graft site  Right arm with +pulse of dialysis graft; no thrill NT  Vitals reviewed.   Imaging: Dg Chest Portable 1 View  Result Date: 08/23/2018 CLINICAL DATA:  Hypotension and bradycardia EXAM: PORTABLE CHEST 1 VIEW COMPARISON:  07/05/2018.  Chest CT 10/05/2017 FINDINGS: Chronic left pleural effusion and lower lobe collapse based on 2018 and 2019 chest CTs. The remainder of the lungs are clear. Normal heart size and mediastinal contours. IMPRESSION: Chronic left lower lobe collapse and small left effusion. No acute finding compared to  [facility]. Electronically Signed   By: Jonathon  Watts M.D.   On: 08/23/2018 19:33    Labs:  CBC: Recent Labs    07/06/18 0428 07/07/18 1521 08/23/18 1850 08/23/18 1859 08/24/18 0423  WBC 10.4 7.4 7.4  --  9.3  HGB 10.5* 9.6* 13.8 15.3* 12.9  HCT 35.5* 31.3* 43.5 45.0 41.2  PLT 242 179 158  --  150    COAGS: Recent Labs    07/05/18 2129  INR 1.03    BMP: Recent Labs    07/07/18 1521 07/08/18 0320 08/23/18 1850 08/23/18 1859 08/24/18 0423  NA 136 137 135 135 136  K 4.2 3.8 4.5 4.3 4.9  CL 100 97* 99 102 103  CO2 25 29 20*  --  20*  GLUCOSE 149* 117* 134* 138* 170*  BUN 38* 18 30* 33* 33*  CALCIUM 7.8* 7.9* 7.8*  --  7.6*  CREATININE 4.36* 2.58* 4.25* 4.50* 4.66*  GFRNONAA 9* 16* 9*  --  8*  GFRAA 10* 19* 10*  --  9*    LIVER FUNCTION TESTS: Recent Labs    07/07/18 1521 08/23/18 1850  BILITOT  --  0.7  AST  --  50*  ALT  --  27  ALKPHOS  --  132*  PROT  --  7.6  ALBUMIN 2.6* 3.2*    TUMOR MARKERS: No results for input(s): AFPTM, CEA, CA199, CHROMGRNA in the last 8760 hours.  Assessment and Plan:  ESRD Scheduled for left arm dialysis graft ligation in OR with Dr Dickson Fr 9/6.  Plan now for right forearm dialysis graft evaluation with possible intervention; known difficult canalization and slow flow  Pt is aware of procedure benefits and risks including   but not limited to Infection; bleeding; damage to graft Agreeable to proceed Consent signed and in chart  Thank you for this interesting consult.  I greatly enjoyed meeting [name] James and look forward to participating in their care.  A copy of this report was sent to the requesting provider on this date.  Electronically Signed: TURPIN,PAMELA A, PA-C 08/24/2018, 2:04 PM   I spent a total of 20 Minutes    in face to face in clinical consultation, greater than 50% of which was counseling/coordinating care for right arm dialysis graft evaluation and possible intervention 

## 2018-08-24 NOTE — Consult Note (Addendum)
Cardiology Consultation:   Patient ID: SIEDAH SEDOR; [id number]; June 17, 1936   Admit date: 08/23/2018 Date of Consult: 08/24/2018  Primary Care Provider: [facility], [place] Primary Cardiologist: Sanda Klein, MD  Primary Electrophysiologist:  Dr. Caryl Comes (at hospital July stay)   Patient Profile:   [name] James is a 82 y.o. female with a hx of ESRF on HD for almost 4 years, PAFib not on a/c 2/2 GIB, DM, hx of breast c/a w/hx of b/l mastectomies, she has chronic hypoxic resp failure on O2, reported to have a chronic L pleural effusion, known coronary/aortic atherosclerosis, no caths, negative myoview in 2015, she is a resident at [facility] and described as wheelchair/bed bound  who is being seen today for the evaluation of bradycardia at the request of Dr. [name].  History of Present Illness:   [name] James was seen by EP service in July for bradycardia during a hospital stay for a c/o CP.  AT that stay a history that she is known to have some low BP issues with HD, no known brady hx.  She takes Midodrine 75m prior to HD and 5 mg 1/2 way through.  Occasionally she is symptomatic with the hypotension, "they just put my head back till it passes".  She reported being bed/wheelchair bound for about 3 years, sounded like functional decline, a very poor gait, falls.  She reported hx of fainting "many many years ago" she can not recall the scenario.  During that stay she was noted to have junctional rhythm 40's as well as SR 60's, her home lopressor was stopped and recommended stopping amiodarone as well.  She had high infection risk given HD, she had a new HD graft R arm, not yet in use, placed 06/08/18, and old AVF R upper arm, still functioning, though poorly at times. and hx of having had had extensive chest surgery she states with numerous large cysts (not cancer) removed, essentially she states b/l mastectomies, with much dissection, was noted to have number of veins on chest wall, suspect some degree  of UE chronic venous thrombus/occlusion may have access issues, all of this felt somewhat of a moot point given she is bed-bound and not clearly having any bradycardic symptoms, no pacing was recommended unless she developed recurrent AFib and fast, would need revisit for pacing.  H&P from this admission mentions steal syndrome on RUE as well  We are recalled this admission to revisit her bradycardia. She was admitted last evening with reports of generalized weakness of late, had dialysis yesterday associated with a near syncopal event, found hypotensive (reportedly SBP 60's-70's and bradycardic (reportedly HR 30's), EMS was called, treated with atropine and IVF.  Improving her HR and BP  HOME meds reviewed:  Cosopt (containing timolol) only potential HR/nodal blocking agent  LABS K+ 4.3, 4.9 BUN/Creat 33/4.50 Trop I: <0.03 x2 WBC 9.3 H/H 12/41  Past Medical History:  Diagnosis Date  . Anemia, chronic disease   . Anxiety   . Arthritis    knees  . Atrial fibrillation ([facility]   . Barrett esophagus   . Benign paroxysmal positional vertigo   . Breast cancer ([facility] 1980s   bil mastectomies, no radiation or chemo  . Cataract   . Chest pain, atypical 12/05/2008   R/Lmv- normal perfusion all regions, noe ECG changes   . CHF (congestive heart failure) ([facility] 05/13/2011   echo - EME>26% stage 1 diastolic dysfunction; elevated LV filling pressure, MAC  . CKD (chronic kidney disease), stage III ([facility]   .  Claudication ([place]) 10/30/2005   doppler - normal evaluation, no evidence of aneurysm, diameter reduction, dissection, compression or vascular abnormality)  . Complication of anesthesia   . Dyslipidemia   . Dysrhythmia    Atrial Fibrillation  . Edema   . ESRD (end stage renal disease) on dialysis ([place])    "[facility]; [address]; MWF" (07/06/2018)  . Family history of adverse reaction to anesthesia    son also has nausea   . Fever blister 12/2016  . Gastritis and gastroduodenitis   . GERD  (gastroesophageal reflux disease)   . Gout   . Hemodialysis-associated hypotension   . HLD (hyperlipidemia)   . Hypertension   . IBS (irritable bowel syndrome)   . Insomnia   . Morbid obesity ([place])   . On home oxygen therapy    "2L; 24/7" (07/06/2018)  . PAF (paroxysmal atrial fibrillation) ([place])   . Peripheral neuropathy   . Pneumonia 12/2016  . PONV (postoperative nausea and vomiting)   . Renal cyst   . Renal insufficiency 10/30/2005   doppler - abn resistance consistent w/ parenchymal disease  . Renovascular hypertension   . Type 2 diabetes mellitus with diabetic neuropathy, with long-term current use of insulin ([place])   . Vertigo     Past Surgical History:  Procedure Laterality Date  . A/V FISTULAGRAM Left 03/19/2018   Procedure: A/V FISTULAGRAM;  Surgeon: Angelia Mould, MD;  Location: [place] CV LAB;  Service: Cardiovascular;  Laterality: Left;  . A/V SHUNTOGRAM Left 01/19/2018   Procedure: A/V SHUNTOGRAM;  Surgeon: Serafina Mitchell, MD;  Location: [place] [facility];  Service: Cardiovascular;  Laterality: Left;  . ANGIOPLASTY Left 02/12/2015   Procedure: ANGIOPLASTY;  Surgeon: Angelia Mould, MD;  Location: [facility] CATH LAB;  Service: Cardiovascular;  Laterality: Left;  AVF  . AV FISTULA PLACEMENT Left 11/30/2014   Procedure: ARTERIOVENOUS (AV) FISTULA CREATION LEFT ARM;  Surgeon: Angelia Mould, MD;  Location: [place];  Service: Vascular;  Laterality: Left;  . AV FISTULA PLACEMENT Left 03/20/2015   Procedure:  Inserton of Left Upper Arm Gortex Graft;  Surgeon: Angelia Mould, MD;  Location: [place];  Service: Vascular;  Laterality: Left;  . AV FISTULA PLACEMENT Left 04/12/2015   Procedure: INSERTION OF LEFT ARM  ARTERIOVENOUS GORE-TEX GRAFT ;  Surgeon: Angelia Mould, MD;  Location: [place];  Service: Vascular;  Laterality: Left;  . AV FISTULA PLACEMENT Right 06/08/2018   Procedure: INSERTION OF ARTERIOVENOUS (AV) GORE-TEX GRAFT ARM USING 4-7MM X 45CM  GORETEX GRAFT;  Surgeon: Angelia Mould, MD;  Location: [place];  Service: Vascular;  Laterality: Right;  . BACK SURGERY  ~1980  . CATARACT EXTRACTION Right   . CHOLECYSTECTOMY    . ESOPHAGOGASTRODUODENOSCOPY N/A 10/24/2014   Procedure: ESOPHAGOGASTRODUODENOSCOPY (EGD);  Surgeon: Jerene Bears, MD;  Location: [facility] ENDOSCOPY;  Service: Endoscopy;  Laterality: N/A;  . EXCHANGE OF A DIALYSIS CATHETER Left 11/30/2014   Procedure: EXCHANGE OF A DIALYSIS CATHETER, LEFT INTERNAL JUGULAR;  Surgeon: Angelia Mould, MD;  Location: [place];  Service: Vascular;  Laterality: Left;  . EYE SURGERY Left    transplant  . FISTULOGRAM N/A 02/12/2015   Procedure: FISTULOGRAM;  Surgeon: Angelia Mould, MD;  Location[facility] CATH LAB;  Service: Cardiovascular;  Laterality: N/A;  . HEMORRHOID SURGERY    . LIGATION OF ARTERIOVENOUS  FISTULA Left 03/20/2015   Procedure: LIGATION OF ARTERIOVENOUS  FISTULA;  Surgeon: Angelia Mould, MD;  Location: [place];  Service:  Vascular;  Laterality: Left;  Marland Kitchen MASTECTOMY Bilateral ~1982   bil breast reconstruction with implants  . ORIF TIBIA & FIBULA FRACTURES Left 2007   also had left non displaced malleolar fracture.   Marland Kitchen PERIPHERAL VASCULAR BALLOON ANGIOPLASTY Left 01/19/2018   Procedure: PERIPHERAL VASCULAR BALLOON ANGIOPLASTY;  Surgeon: Serafina Mitchell, MD;  Location: [place] CV LAB;  Service: Cardiovascular;  Laterality: Left;  fistula  . PERIPHERAL VASCULAR BALLOON ANGIOPLASTY Left 03/19/2018   Procedure: PERIPHERAL VASCULAR BALLOON ANGIOPLASTY;  Surgeon: Angelia Mould, MD;  Location: [place] CV LAB;  Service: Cardiovascular;  Laterality: Left;  UPPER ARM FISTULA  . REMOVAL OF GRAFT Left 04/12/2015   Procedure: REMOVAL OF LEFT ARM ARTERIOVENOUS GORE-TEX GRAFT;  Surgeon: Angelia Mould, MD;  Location: [place];  Service: Vascular;  Laterality: Left;     Home Medications:  Prior to Admission medications   Medication Sig Start Date End Date  Taking? Authorizing Provider  acetaminophen (TYLENOL) 325 MG tablet Take 650 mg by mouth every 6 (six) hours as needed for fever ("for vomiting/nausea," per [facility]).    Yes [provider]  allopurinol (ZYLOPRIM) 100 MG tablet Take 100 mg by mouth daily.   Yes [provider]  aspirin 81 MG chewable tablet Chew 81 mg by mouth daily.   Yes [provider]  B Complex-C-Folic Acid (NEPHRO VITAMINS) 0.8 MG TABS Take 1 tablet by mouth daily.   Yes [provider]  diazepam (VALIUM) 5 MG tablet Take 1 tablet (5 mg total) by mouth at bedtime. 07/08/18  Yes Arrien, Jimmy Picket, MD  DIMETHICONE, TOPICAL, 5 % CREA Apply 1 application topically See admin instructions. Place a nickel-sized amount onto buttock(s) area 2 times a day for barrier protection   Yes [provider]  dorzolamide-timolol (COSOPT) 22.3-6.8 MG/ML ophthalmic solution Place 1 drop into the left eye 2 (two) times daily. Wait 2-5 minutes when giving two eye meds   Yes [provider]  hydrALAZINE (APRESOLINE) 10 MG tablet Take 5 mg by mouth 3 (three) times daily as needed (hypertension).   Yes [provider]  loperamide (IMODIUM A-D) 2 MG tablet Take 2 mg by mouth 3 (three) times daily as needed (for diarrhea).    Yes [provider]  loratadine (CLARITIN) 10 MG tablet Take 10 mg by mouth daily.   Yes [provider]  Melatonin 5 MG TABS Take 6 mg by mouth at bedtime.    Yes [provider]  midodrine (PROAMATINE) 10 MG tablet Take 10 mg by mouth See admin instructions. Take 10 mg by mouth WITH MORNING MEDS AT FACILITY on Mon/Wed/Fri- dialysis days Take 10 mg by mouth with dialysis on Mon/Wed/Fri   Yes [provider]  OXYGEN Inhale 2 L into the lungs continuous.    Yes [provider]  Polyethyl Glycol-Propyl Glycol (SYSTANE ULTRA) 0.4-0.3 % SOLN Place 1 drop into both eyes 3 (three) times daily.   Yes [provider]    promethazine (PHENERGAN) 25 MG tablet Take 25 mg by mouth every 8 (eight) hours as needed for nausea or vomiting.   Yes [provider]  oxyCODONE (ROXICODONE) 5 MG immediate release tablet Take 1 tablet (5 mg total) by mouth every 4 (four) hours as needed for severe pain. Patient not taking: Reported on 08/09/2018 07/08/18   Arrien, Jimmy Picket, MD    Inpatient Medications: Scheduled Meds: . allopurinol  100 mg Oral Daily  . aspirin  81 mg Oral Daily  .  Chlorhexidine Gluconate Cloth  6 each Topical Q0600  . diazepam  5 mg Oral QHS  . dimethicone  1 application Topical BID  . dorzolamide-timolol  1 drop Left Eye BID  . [START ON 08/25/2018] doxercalciferol  1 mcg Intravenous Q M,W,F-HD  . heparin  5,000 Units Subcutaneous Q8H  . hydroxypropyl methylcellulose / hypromellose  1 drop Both Eyes TID  . loratadine  10 mg Oral Daily  . Melatonin  6 mg Oral QHS  . multivitamin  1 tablet Oral QHS  . mupirocin ointment  1 application Nasal BID   Continuous Infusions: . [START ON 08/25/2018] ferric gluconate (FERRLECIT/NULECIT) IV     PRN Meds: acetaminophen **OR** acetaminophen, atropine, loperamide, ondansetron **OR** ondansetron (ZOFRAN) IV, zolpidem  Allergies:    Allergies  Allergen Reactions  . Erythromycin Swelling  . Codeine Palpitations, Rash and Other (See Comments)    GI Upset  . Erythromycin Base Other (See Comments)    GI Upset  . Penicillin G Rash    Has patient had a PCN reaction causing immediate rash, facial/tongue/throat swelling, SOB or lightheadedness with hypotension: Yes Has patient had a PCN reaction causing severe rash involving mucus membranes or skin necrosis: Unk Has patient had a PCN reaction that required hospitalization: Unk Has patient had a PCN reaction occurring within the last 10 years: Unk If all of the above answers are "NO", then may proceed with Cephalosporin use..  . Penicillins Rash and Other (See Comments)    Has taken Keflex & Rocephin  many times without problems Has patient had a PCN reaction causing immediate rash, facial/tongue/throat swelling, SOB or lightheadedness with hypotension: Yes Has patient had a PCN reaction causing severe rash involving mucus membranes or skin necrosis: Unk Has patient had a PCN reaction that required hospitalization: Unk Has patient had a PCN reaction occurring within the last 10 years: Unk If all of the above answers are "NO", then may proceed with Cephalosporin use.   Keturah Shavers [Ranitidine Hcl] Nausea And Vomiting    Social History:   Social History   Socioeconomic History  . Marital status: Divorced    Spouse name: Not on file  . Number of children: Not on file  . Years of education: Not on file  . Highest education level: Not on file  Occupational History  . Not on file  Social Needs  . Financial resource strain: Not on file  . Food insecurity:    Worry: Not on file    Inability: Not on file  . Transportation needs:    Medical: Not on file    Non-medical: Not on file  Tobacco Use  . Smoking status: Never Smoker  . Smokeless tobacco: Never Used  Substance and Sexual Activity  . Alcohol use: No  . Drug use: No  . Sexual activity: Not on file  Lifestyle  . Physical activity:    Days per week: Not on file    Minutes per session: Not on file  . Stress: Not on file  Relationships  . Social connections:    Talks on phone: Not on file    Gets together: Not on file    Attends religious service: Not on file    Active member of club or organization: Not on file    Attends meetings of clubs or organizations: Not on file    Relationship status: Not on file  . Intimate partner violence:    Fear of current or ex partner: Not on file  Emotionally abused: Not on file    Physically abused: Not on file    Forced sexual activity: Not on file  Other Topics Concern  . Not on file  Social History Narrative   [facility] (10/06/17):   Originally from [place]. Has always lived  in [place]. She currently lives in [place]. No bird exposure. [place] Previously traveled to [place], [place], & [place]. Previously worked in a nursery and also a Pharmacist, community.     Family History:   Family History  Problem Relation Age of Onset  . Diabetes Mother   . Heart disease Mother   . Non-Hodgkin's lymphoma Mother   . Heart attack Father   . Stroke Father   . Parkinson's disease Brother   . Lung disease Brother   . Heart disease Brother   . Multiple myeloma Sister      ROS:  Please see the history of present illness.  All other ROS reviewed and negative.     Physical Exam/Data:   Vitals:   08/24/18 0538 08/24/18 0604 08/24/18 0608 08/24/18 0610  BP: (!) 89/70 (!) 81/33 (!) 83/38 (!) 87/29  Pulse: (!) 39 (!) 41 (!) 41 (!) 40  Resp: _0 Temp:      TempSrc:      SpO2: 100% 100% 100% 100%   No intake or output data in the 24 hours ending 08/24/18 1235 There were no vitals filed for this visit. There is no height or weight on file to calculate BMI.  General:  Well nourished, well developed, in no acute distress, just finishing up lunch HEENT: normal Lymph: no adenopathy Neck: no JVD Endocrine:  No thryomegaly Vascular: No carotid bruits, she has a left IJ line in place Cardiac:  RRR; bradycardic, no murmurs, gallops or rubs Lungs:  diminished at b/l bases, no wheezing, rhonchi or rales  Abd: soft, nontender, no hepatomegaly  Ext: no edema Musculoskeletal:  No deformities Skin: warm and dry  Neuro:  No gross focal abnormalities noted Psych:  Normal affect   EKG:  The EKG was personally reviewed and demonstrates:   Junctional rhythm 40bpm Junctional rhythm, PACs, 42bpm  Telemetry:  Telemetry was personally reviewed and demonstrates:   Junctional bradycardia 40's  Relevant CV Studies:  07/06/18 Study Conclusions - Left ventricle: The cavity size was normal. Wall thickness was increased in a pattern of mild LVH. Systolic function  was vigorous. The estimated ejection fraction was in the range of 65% to 70%. Wall motion was normal; there were no regional wall motion abnormalities. The study is not technically sufficient to allow evaluation of LV diastolic function. - Aortic valve: There was trivial regurgitation. - Mitral valve: Calcified annulus. - Left atrium: The atrium was severely dilated. - Pulmonary arteries: Systolic pressure was mildly increased. PA peak pressure: 44 mm Hg (S). Impressions: - Vigorous LV systolic function; mild LVH; sclerotic aortic valve with trace AI; mildly elevated LVOT velocity of 2.3 m/s likely related to vigorous LV systolic function; severe LAE; mild TR with mild pulmonary hypertension.  Myoview 12/20/14: 1. No reversible ischemia. Small mid inferolateral wall infarct. 2. Normal left ventricular wall motion. 3. Left ventricular ejection fraction 68% 4. Low-risk stress test findings  Laboratory Data:  Chemistry Recent Labs  Lab 08/23/18 1850 08/23/18 1859 08/24/18 0423  NA 135 135 136  K 4.5 4.3 4.9  CL 99 102 103  CO2 20*  --  20*  GLUCOSE 134* 138* 170*  BUN 30* 33* 33*  CREATININE 4.25* 4.50* 4.66*  CALCIUM 7.8*  --  7.6*  GFRNONAA 9*  --  8*  GFRAA 10*  --  9*  ANIONGAP 16*  --  13    Recent Labs  Lab 08/23/18 1850  PROT 7.6  ALBUMIN 3.2*  AST 50*  ALT 27  ALKPHOS 132*  BILITOT 0.7   Hematology Recent Labs  Lab 08/23/18 1850 08/23/18 1859 08/24/18 0423  WBC 7.4  --  9.3  RBC 4.15  --  3.92  HGB 13.8 15.3* 12.9  HCT 43.5 45.0 41.2  MCV 104.8*  --  105.1*  MCH 33.3  --  32.9  MCHC 31.7  --  31.3  RDW 15.9*  --  15.9*  PLT 158  --  150   Cardiac Enzymes Recent Labs  Lab 08/23/18 2316 08/24/18 0423 08/24/18 1034  TROPONINI <0.03 <0.03 <0.03    Recent Labs  Lab 08/23/18 1857  TROPIPOC 0.02    BNPNo results for input(s): BNP, PROBNP in the last 168 hours.  DDimer No results for input(s): DDIMER in the last 168  hours.  Radiology/Studies:   Dg Chest Portable 1 View Result Date: 08/23/2018 CLINICAL DATA:  Hypotension and bradycardia EXAM: PORTABLE CHEST 1 VIEW COMPARISON:  07/05/2018.  Chest CT 10/05/2017 FINDINGS: Chronic left pleural effusion and lower lobe collapse based on 2018 and 2019 chest CTs. The remainder of the lungs are clear. Normal heart size and mediastinal contours. IMPRESSION: Chronic left lower lobe collapse and small left effusion. No acute finding compared to priors. Electronically Signed   By: Monte Fantasia M.D.   On: 08/23/2018 19:33    Assessment and Plan:   1. Junctional bradycardia     Off her lopressor and amiodarone since her last hospital stay in July      2. Hypotension     SBP 80's-low 90's reported in Epic, though her monitor in the room have SBP all 120-140 range, despite ongoing bradycardia in the 40's,  I have confirmed with her RN, that these are only her BP's, no-one elses.  The patient is not a straightforward case.  Her BP (taken by R thigh) is better, hse feels tired, but not weak or near syncopal like yesterday.  No CP. She reports yesterday they had much difficulty with her RUE graft  She follows with vascular closely, they have seen her this admission, noting she has a poorly functioning graft in the left arm with chronic swelling in the left arm related to a central venous occlusion which is been addressed multiple times. the plan was to proceed with left UE graft ligation since she had a working right forearm loop graft.  It was noted that in HD for the past 2 sessions she has decreased flow in the right forearm graft.  She was scheduled to see [facility] MDs for intervention.  It looks like she is planned for a shuntogram of the right forearm loop graft with possible intervention if possible tomorrow AM since this is her only working access.  Given her BP is better, I wonder how much her bradycardia contributes to her low BP issue.  She clearly has sinus  node dysfunction.  Doubt her timolol plays a role.  I will review case with Dr. [name], will see her later today.   For questions or updates, please contact [facility] Please consult [email or web address] for contact info under Cardiology/STEMI.   Signed, Baldwin Jamaica, PA-C  08/24/2018 12:35 PM  EP Attending  Patient seen and examined. Agree with the findings as noted above. I have discussed the case with Dr. [name], [name], and [name]. She has symptomatic sinus node dysfunction and a clear cut indication for a PPM. Unfortunately her access is such the left left subclavian was previously used for HD but is now occluded based on the collaterals on her skin, a right arm that is being used for HD which is functioning poorly and will undergo revision with interventional radiology in the next few days. Placement of a PPM in her right femoral vein would be a consideration but examination of the area is particularly discouraging due to her massive obesity and multiple folds and evidence of chronic fungal infection. I strongly suspect that any insertion of a PPM from the right femoral vein would lead to an cardiac device infection with in a few months. She is quite sedentary and does not walk or transfer and lays in the bed much of the day. Her exam is accurately documented above. Other than an Epicardial PPM, we do not have an option for improving her HR other than possibly adding theophylline or encouraging her to drink more caffeine. She is approaching hospice level care.  Mikle Bosworth.D.

## 2018-08-24 NOTE — Significant Event (Addendum)
Rapid Response Event Note  Overview: Time Called: 2337 Event Type: Cardiac  Initial Focused Assessment: Called by RN about patient's HR being in the 30-40s. Per RN, patient is not symptomatic. Currently BPs are being taken in the legs b/c BUE are restricted so noninvasive BPs are not correlating. Upon seeing the patient, patient was alert and oriented, skin warm and dry, mentating appropriately. Patient denies being lightheaded, dizzy, and denies CP.   Interventions: - RN to have Atropine at bedside.  Plan of Care: - Monitor HR and assess patient as needed for signs of poor perfusion - Treat HR if patient is symptomatic.  - RN has kept RRT updated as night as gone on as well   Event Summary: Name of Physician Notified: TRH NP was notified by Primary RN   Outcome: Stayed in room and stabalized  End Time 0010  [place], [place]

## 2018-08-24 NOTE — Consult Note (Addendum)
[facility] Renal Consultation Note  Indication for Consultation:  Management of ESRD/hemodialysis; anemia, hypertension/volume and secondary hyperparathyroidism  HPI: [name] James is a 81 y.o. female with ESRD started HD 11/2014 DM, HTN, Afib, gout, GERD, IBS, diastolic HF, barret's sesophagus, breast cancer, Fx L ankle, hypoalbuminemia, AOCD, SHPT,7/16-7/18 [facility] admit junct. bradycardia, atypical CP. Held amiodarone, BB and coumadin. L Upper arm swelling with 01/19/18 occlusion left innominate and subclavian vein successfully recanalized and treated with drug-coated balloon angioplasty,has recurrent swelling  ( plans for ligation LUA AVGG 08/27/18). Now admitted with Bradycardia, hypotension,and weakness. noted K = 4.9     She had her OP HD yesterday,bp and heart rate ok  By documentation and discussion with HD RNs. She  did have difficulties cannulating her RFA AVG but finished tx with mild swelling and noted Access flow was low(she had [facility] scheduled [facility] ). After HD went to her [facility]  And sent  To ER  Secondary to Hypotension and heart rate in 30 s at [facility].Noted she is on Midodrine 10 mg MWF  Hd days for bp support.   Currently in bed with no complaints Heart rate 40, Bp (THIGH BP Cuff ) 152/86 . No reported Cp, SOB, Fevers, chills, GI symptoms, dizziness, She is wheelchair bound past 4 years .    Past Medical History:  Diagnosis Date  . Anemia, chronic disease   . Anxiety   . Arthritis    knees  . Atrial fibrillation ([place])   . Barrett esophagus   . Benign paroxysmal positional vertigo   . Breast cancer ([place]) 1980s   bil mastectomies, no radiation or chemo  . Cataract   . Chest pain, atypical 12/05/2008   R/Lmv- normal perfusion all regions, noe ECG changes   . CHF (congestive heart failure) ([place]) 05/13/2011   echo - UX>32%; stage 1 diastolic dysfunction; elevated LV filling pressure, MAC  . CKD (chronic kidney disease), stage III ([place])   .  Claudication ([place]) 10/30/2005   doppler - normal evaluation, no evidence of aneurysm, diameter reduction, dissection, compression or vascular abnormality)  . Complication of anesthesia   . Dyslipidemia   . Dysrhythmia    Atrial Fibrillation  . Edema   . ESRD (end stage renal disease) on dialysis ([place])    "[facility]; [address]; MWF" (07/06/2018)  . Family history of adverse reaction to anesthesia    son also has nausea   . Fever blister 12/2016  . Gastritis and gastroduodenitis   . GERD (gastroesophageal reflux disease)   . Gout   . Hemodialysis-associated hypotension   . HLD (hyperlipidemia)   . Hypertension   . IBS (irritable bowel syndrome)   . Insomnia   . Morbid obesity ([place])   . On home oxygen therapy    "2L; 24/7" (07/06/2018)  . PAF (paroxysmal atrial fibrillation) ([place])   . Peripheral neuropathy   . Pneumonia 12/2016  . PONV (postoperative nausea and vomiting)   . Renal cyst   . Renal insufficiency 10/30/2005   doppler - abn resistance consistent w/ parenchymal disease  . Renovascular hypertension   . Type 2 diabetes mellitus with diabetic neuropathy, with long-term current use of insulin ([place])   . Vertigo     Past Surgical History:  Procedure Laterality Date  . A/V FISTULAGRAM Left 03/19/2018   Procedure: A/V FISTULAGRAM;  Surgeon: Angelia Mould, MD;  Location: [facility];  Service: Cardiovascular;  Laterality: Left;  . A/V SHUNTOGRAM Left  01/19/2018   Procedure: A/V SHUNTOGRAM;  Surgeon: Serafina Mitchell, MD;  Location: [facility];  Service: Cardiovascular;  Laterality: Left;  . ANGIOPLASTY Left 02/12/2015   Procedure: ANGIOPLASTY;  Surgeon: Angelia Mould, MD;  Location: [facility] CATH LAB;  Service: Cardiovascular;  Laterality: Left;  AVF  . AV FISTULA PLACEMENT Left 11/30/2014   Procedure: ARTERIOVENOUS (AV) FISTULA CREATION LEFT ARM;  Surgeon: Angelia Mould, MD;  Location: [facility];  Service: Vascular;  Laterality: Left;  . AV FISTULA  PLACEMENT Left 03/20/2015   Procedure:  Inserton of Left Upper Arm Gortex Graft;  Surgeon: Angelia Mould, MD;  Location: [facility];  Service: Vascular;  Laterality: Left;  . AV FISTULA PLACEMENT Left 04/12/2015   Procedure: INSERTION OF LEFT ARM  ARTERIOVENOUS GORE-TEX GRAFT ;  Surgeon: Angelia Mould, MD;  Location: [facility];  Service: Vascular;  Laterality: Left;  . AV FISTULA PLACEMENT Right 06/08/2018   Procedure: INSERTION OF ARTERIOVENOUS (AV) GORE-TEX GRAFT ARM USING 4-7MM X 45CM GORETEX GRAFT;  Surgeon: Angelia Mould, MD;  Location: [facility];  Service: Vascular;  Laterality: Right;  . BACK SURGERY  ~1980  . CATARACT EXTRACTION Right   . CHOLECYSTECTOMY    . ESOPHAGOGASTRODUODENOSCOPY N/A 10/24/2014   Procedure: ESOPHAGOGASTRODUODENOSCOPY (EGD);  Surgeon: Jerene Bears, MD;  Location: [facility] ENDOSCOPY;  Service: Endoscopy;  Laterality: N/A;  . EXCHANGE OF A DIALYSIS CATHETER Left 11/30/2014   Procedure: EXCHANGE OF A DIALYSIS CATHETER, LEFT INTERNAL JUGULAR;  Surgeon: Angelia Mould, MD;  Location: [facility];  Service: Vascular;  Laterality: Left;  . EYE SURGERY Left    transplant  . FISTULOGRAM N/A 02/12/2015   Procedure: FISTULOGRAM;  Surgeon: Angelia Mould, MD;  [facility] C[facility] LAB;  Service: Cardiovascular;  Laterality: N/A;  . HEMORRHOID SURGERY    . LIGATION OF ARTERIOVENOUS  FISTULA Left 03/20/2015   Procedure: LIGATION OF ARTERIOVENOUS  FISTULA;  Surgeon: Angelia Mould, MD;  Location: [facility];  Service: Vascular;  Laterality: Left;  Marland Kitchen MASTECTOMY Bilateral ~1982   bil breast reconstruction with implants  . ORIF TIBIA & FIBULA FRACTURES Left 2007   also had left non displaced malleolar fracture.   Marland Kitchen PERIPHERAL VASCULAR BALLOON ANGIOPLASTY Left 01/19/2018   Procedure: PERIPHERAL VASCULAR BALLOON ANGIOPLASTY;  Surgeon: Serafina Mitchell, MD;  Location: [facility];  Service: Cardiovascular;  Laterality: Left;  fistula  . PERIPHERAL VASCULAR BALLOON  ANGIOPLASTY Left 03/19/2018   Procedure: PERIPHERAL VASCULAR BALLOON ANGIOPLASTY;  Surgeon: Angelia Mould, MD;  Location: [facility];  Service: Cardiovascular;  Laterality: Left;  UPPER ARM FISTULA  . REMOVAL OF GRAFT Left 04/12/2015   Procedure: REMOVAL OF LEFT ARM ARTERIOVENOUS GORE-TEX GRAFT;  Surgeon: Angelia Mould, MD;  Location: [facility] OR;  Service: Vascular;  Laterality: Left;      Family History  Problem Relation Age of Onset  . Diabetes Mother   . Heart disease Mother   . Non-Hodgkin's lymphoma Mother   . Heart attack Father   . Stroke Father   . Parkinson's disease Brother   . Lung disease Brother   . Heart disease Brother   . Multiple myeloma Sister       reports that she has never smoked. She has never used smokeless tobacco. She reports that she does not drink alcohol or use drugs.   Allergies  Allergen Reactions  . Erythromycin Swelling  . Codeine Palpitations, Rash and Other (See Comments)    GI Upset  .  Erythromycin Base Other (See Comments)    GI Upset  . Penicillin G Rash    Has patient had a PCN reaction causing immediate rash, facial/tongue/throat swelling, SOB or lightheadedness with hypotension: Yes Has patient had a PCN reaction causing severe rash involving mucus membranes or skin necrosis: Unk Has patient had a PCN reaction that required hospitalization: Unk Has patient had a PCN reaction occurring within the last 10 years: Unk If all of the above answers are "NO", then may proceed with Cephalosporin use..  . Penicillins Rash and Other (See Comments)    Has taken Keflex & Rocephin many times without problems Has patient had a PCN reaction causing immediate rash, facial/tongue/throat swelling, SOB or lightheadedness with hypotension: Yes Has patient had a PCN reaction causing severe rash involving mucus membranes or skin necrosis: Unk Has patient had a PCN reaction that required hospitalization: Unk Has patient had a PCN reaction  occurring within the last 10 years: Unk If all of the above answers are "NO", then may proceed with Cephalosporin use.   Keturah Shavers [Ranitidine Hcl] Nausea And Vomiting    Prior to Admission medications   Medication Sig Start Date End Date Taking? Authorizing Provider  acetaminophen (TYLENOL) 325 MG tablet Take 650 mg by mouth every 6 (six) hours as needed for fever ("for vomiting/nausea," per [facility]).    Yes [provider]  allopurinol (ZYLOPRIM) 100 MG tablet Take 100 mg by mouth daily.   Yes [provider]  aspirin 81 MG chewable tablet Chew 81 mg by mouth daily.   Yes [provider]  B Complex-C-Folic Acid (NEPHRO VITAMINS) 0.8 MG TABS Take 1 tablet by mouth daily.   Yes [provider]  diazepam (VALIUM) 5 MG tablet Take 1 tablet (5 mg total) by mouth at bedtime. 07/08/18  Yes Arrien, Jimmy Picket, MD  DIMETHICONE, TOPICAL, 5 % CREA Apply 1 application topically See admin instructions. Place a nickel-sized amount onto buttock(s) area 2 times a day for barrier protection   Yes [provider]  dorzolamide-timolol (COSOPT) 22.3-6.8 MG/ML ophthalmic solution Place 1 drop into the left eye 2 (two) times daily. Wait 2-5 minutes when giving two eye meds   Yes [provider]  hydrALAZINE (APRESOLINE) 10 MG tablet Take 5 mg by mouth 3 (three) times daily as needed (hypertension).   Yes [provider]  loperamide (IMODIUM A-D) 2 MG tablet Take 2 mg by mouth 3 (three) times daily as needed (for diarrhea).    Yes [provider]  loratadine (CLARITIN) 10 MG tablet Take 10 mg by mouth daily.   Yes [provider]  Melatonin 5 MG TABS Take 6 mg by mouth at bedtime.    Yes [provider]  midodrine (PROAMATINE) 10 MG tablet Take 10 mg by mouth See admin instructions. Take 10 mg by mouth WITH MORNING MEDS AT FACILITY on Mon/Wed/Fri- dialysis days Take 10 mg by mouth with dialysis on Mon/Wed/Fri   Yes [provider]  OXYGEN Inhale 2 L into the lungs continuous.    Yes [provider]  Polyethyl Glycol-Propyl Glycol (SYSTANE ULTRA) 0.4-0.3 % SOLN Place 1 drop into both eyes 3 (three) times daily.   Yes [provider]  promethazine (PHENERGAN) 25 MG tablet Take 25 mg by mouth every 8 (eight) hours as needed for nausea or vomiting.   Yes [provider]  oxyCODONE (ROXICODONE) 5 MG immediate release tablet Take 1 tablet (5 mg total) by mouth every 4 (four)  hours as needed for severe pain. Patient not taking: Reported on 08/09/2018 07/08/18   Arrien, Jimmy Picket, MD     Anti-infectives (From admission, onward)   None      Results for orders placed or performed during the hospital encounter of 08/23/18 (from the past 48 hour(s))  CBC with Differential     Status: Abnormal   Collection Time: 08/23/18  6:50 PM  Result Value Ref Range   WBC 7.4 4.0 - 10.5 K/uL   RBC 4.15 3.87 - 5.11 MIL/uL   Hemoglobin 13.8 12.0 - 15.0 g/dL   HCT 43.5 36.0 - 46.0 %   MCV 104.8 (H) 78.0 - 100.0 fL   MCH 33.3 26.0 - 34.0 pg   MCHC 31.7 30.0 - 36.0 g/dL   RDW 15.9 (H) 11.5 - 15.5 %   Platelets 158 150 - 400 K/uL   Neutrophils Relative % 53 %   Neutro Abs 4.0 1.7 - 7.7 K/uL   Lymphocytes Relative 34 %   Lymphs Abs 2.5 0.7 - 4.0 K/uL   Monocytes Relative 10 %   Monocytes Absolute 0.7 0.1 - 1.0 K/uL   Eosinophils Relative 2 %   Eosinophils Absolute 0.1 0.0 - 0.7 K/uL   Basophils Relative 1 %   Basophils Absolute 0.1 0.0 - 0.1 K/uL   Immature Granulocytes 0 %   Abs Immature Granulocytes 0.0 0.0 - 0.1 K/uL    Comment: Performed at [facility], [address], [place], [place] [address]  Comprehensive metabolic panel     Status: Abnormal   Collection Time: 08/23/18  6:50 PM  Result Value Ref Range   Sodium 135 135 - 145 mmol/L   Potassium 4.5 3.5 - 5.1 mmol/L   Chloride 99 98 - 111 mmol/L   CO2 20 (L) 22 - 32 mmol/L   Glucose, Bld 134 (H) 70 - 99 mg/dL   BUN 30  (H) 8 - 23 mg/dL   Creatinine, Ser 4.25 (H) 0.44 - 1.00 mg/dL   Calcium 7.8 (L) 8.9 - 10.3 mg/dL   Total Protein 7.6 6.5 - 8.1 g/dL   Albumin 3.2 (L) 3.5 - 5.0 g/dL   AST 50 (H) 15 - 41 U/L   ALT 27 0 - 44 U/L   Alkaline Phosphatase 132 (H) 38 - 126 U/L   Total Bilirubin 0.7 0.3 - 1.2 mg/dL   GFR calc non Af Amer 9 (L) >60 mL/min   GFR calc Af Amer 10 (L) >60 mL/min    Comment: (NOTE) The eGFR has been calculated using the CKD EPI equation. This calculation has not been validated in all clinical situations. eGFR's persistently <60 mL/min signify possible Chronic Kidney Disease.    Anion gap 16 (H) 5 - 15    Comment: Performed at [facility], [place] [address], [place], [place] [address]  TSH     Status: None   Collection Time: 08/23/18  6:50 PM  Result Value Ref Range   TSH 1.417 0.350 - 4.500 uIU/mL    Comment: Performed by a 3rd Generation assay with a functional sensitivity of <=0.01 uIU/mL. Performed at [facility], [place] [address], [place], [place] [address]   T4, free     Status: None   Collection Time: 08/23/18  6:50 PM  Result Value Ref Range   Free T4 0.88 0.82 - 1.77 ng/dL    Comment: (NOTE) Biotin ingestion may interfere with free T4 tests. If the results are inconsistent with the  TSH level, previous test results, or the clinical presentation, then consider biotin interference. If needed, order repeat testing after stopping biotin. Performed at [facility], [place] [address], [place], [place] [address]   Culture, blood (single)     Status: None (Preliminary result)   Collection Time: 08/23/18  6:52 PM  Result Value Ref Range   Specimen Description BLOOD LEFT EJ    Special Requests      BOTTLES DRAWN AEROBIC AND ANAEROBIC Blood Culture adequate volume Performed at [facility], [place] [address] [place], [place] [address]    Culture PENDING    Report Status PENDING   I-Stat Troponin, ED (not at [facility])     Status: None   Collection Time:  08/23/18  6:57 PM  Result Value Ref Range   Troponin i, poc 0.02 0.00 - 0.08 ng/mL   Comment 3            Comment: Due to the release kinetics of cTnI, a negative result within the first hours of the onset of symptoms does not rule out myocardial infarction with certainty. If myocardial infarction is still suspected, repeat the test at appropriate intervals.   I-Stat Chem 8, ED     Status: Abnormal   Collection Time: 08/23/18  6:59 PM  Result Value Ref Range   Sodium 135 135 - 145 mmol/L   Potassium 4.3 3.5 - 5.1 mmol/L   Chloride 102 98 - 111 mmol/L   BUN 33 (H) 8 - 23 mg/dL   Creatinine, Ser 4.50 (H) 0.44 - 1.00 mg/dL   Glucose, Bld 138 (H) 70 - 99 mg/dL   Calcium, Ion 0.89 (LL) 1.15 - 1.40 mmol/L   TCO2 21 (L) 22 - 32 mmol/L   Hemoglobin 15.3 (H) 12.0 - 15.0 g/dL   HCT 45.0 36.0 - 46.0 %   Comment NOTIFIED PHYSICIAN   MRSA PCR Screening     Status: Abnormal   Collection Time: 08/23/18 11:15 PM  Result Value Ref Range   MRSA by PCR POSITIVE (A) NEGATIVE    Comment:        The GeneXpert MRSA Assay (FDA approved for NASAL specimens only), is one component of a comprehensive MRSA colonization surveillance program. It is not intended to diagnose MRSA infection nor to guide or monitor treatment for MRSA infections. RESULT CALLED TO, READ BACK BY AND VERIFIED WITH: Eilleen Kempf RN 0210 08/24/18 A BROWNING Performed at [facility], [place] [address] [place], [place] [address]   Cortisol-am, blood     Status: None   Collection Time: 08/23/18 11:16 PM  Result Value Ref Range   Cortisol - AM 9.6 6.7 - 22.6 ug/dL    Comment: Performed at [facility], [place] [address], [place], [place] [address]  Troponin I (q 6hr x 3)     Status: None   Collection Time: 08/23/18 11:16 PM  Result Value Ref Range   Troponin I <0.03 <0.03 ng/mL    Comment: Performed at [facility] [address], [place], [place] [address]  Glucose, capillary     Status: Abnormal   Collection  Time: 08/24/18 12:23 AM  Result Value Ref Range   Glucose-Capillary 163 (H) 70 - 99 mg/dL  Hemoglobin A1c     Status: Abnormal   Collection Time: 08/24/18  4:23 AM  Result Value Ref Range   Hgb A1c MFr Bld 4.4 (L) 4.8 - 5.6 %    Comment: (NOTE) Pre diabetes:  5.7%-6.4% Diabetes:              >6.4% Glycemic control for   <7.0% adults with diabetes    Mean Plasma Glucose 79.58 mg/dL    Comment: Performed at [facility] [address], [place], [place] [address]  Troponin I (q 6hr x 3)     Status: None   Collection Time: 08/24/18  4:23 AM  Result Value Ref Range   Troponin I <0.03 <0.03 ng/mL    Comment: Performed at [facility] [address] [place], [place] [address]  Basic metabolic panel     Status: Abnormal   Collection Time: 08/24/18  4:23 AM  Result Value Ref Range   Sodium 136 135 - 145 mmol/L   Potassium 4.9 3.5 - 5.1 mmol/L   Chloride 103 98 - 111 mmol/L   CO2 20 (L) 22 - 32 mmol/L   Glucose, Bld 170 (H) 70 - 99 mg/dL   BUN 33 (H) 8 - 23 mg/dL   Creatinine, Ser 4.66 (H) 0.44 - 1.00 mg/dL   Calcium 7.6 (L) 8.9 - 10.3 mg/dL   GFR calc non Af Amer 8 (L) >60 mL/min   GFR calc Af Amer 9 (L) >60 mL/min    Comment: (NOTE) The eGFR has been calculated using the CKD EPI equation. This calculation has not been validated in all clinical situations. eGFR's persistently <60 mL/min signify possible Chronic Kidney Disease.    Anion gap 13 5 - 15    Comment: Performed at [facility] [address], [place], [place] [address]  CBC     Status: Abnormal   Collection Time: 08/24/18  4:23 AM  Result Value Ref Range   WBC 9.3 4.0 - 10.5 K/uL   RBC 3.92 3.87 - 5.11 MIL/uL   Hemoglobin 12.9 12.0 - 15.0 g/dL   HCT 41.2 36.0 - 46.0 %   MCV 105.1 (H) 78.0 - 100.0 fL   MCH 32.9 26.0 - 34.0 pg   MCHC 31.3 30.0 - 36.0 g/dL   RDW 15.9 (H) 11.5 - 15.5 %   Platelets 150 150 - 400 K/uL    Comment: Performed at [facility], [place] [address]  [place], [place] [address]  Lipid panel     Status: Abnormal   Collection Time: 08/24/18  4:23 AM  Result Value Ref Range   Cholesterol 157 0 - 200 mg/dL   Triglycerides 147 <150 mg/dL   HDL 32 (L) >40 mg/dL   Total CHOL/HDL Ratio 4.9 RATIO   VLDL 29 0 - 40 mg/dL   LDL Cholesterol 96 0 - 99 mg/dL    Comment:        Total Cholesterol/HDL:CHD Risk Coronary Heart Disease Risk Table                     Men   Women  1/2 Average Risk   3.4   3.3  Average Risk       5.0   4.4  2 X Average Risk   9.6   7.1  3 X Average Risk  23.4   11.0        Use the calculated Patient Ratio above and the CHD Risk Table to determine the patient's CHD Risk.        ATP III CLASSIFICATION (LDL):  <100     mg/dL   Optimal  100-129  mg/dL   Near or Above  Optimal  130-159  mg/dL   Borderline  160-189  mg/dL   High  >190     mg/dL   Very High Performed at [facility] [address] [place], [place] [address]   Glucose, capillary     Status: Abnormal   Collection Time: 08/24/18  8:19 AM  Result Value Ref Range   Glucose-Capillary 131 (H) 70 - 99 mg/dL    ROS: SEE HPI FOR POSITIVES   Physical Exam: Vitals:   08/24/18 0608 08/24/18 0610  BP: (!) 83/38 (!) 87/29  Pulse: (!) 41 (!) 40  Resp: 18 19  Temp:    SpO2: 100% 100%     General: Alert obese elderly female , NAD , Appropriate HEENT: [place] , MMM, sclera not icteric Neck: supple, No jvd Heart: Bradycardia , Irreg , irreg, no m,r,g Lungs: CTA , non labored breathing  Abdomen: Obese, BS pos , soft , NT, ND, some abd. Wall edema Extremities: trace pedal edema, Skin: warm dry , no overt rash Neuro: Alert  OX3, bilat lower extrem weakness (CHRONIC) , upper extrem rom ok ,muscle tone stable  Dialysis Access: R FA AVG faint bruit swelling , LUA AVG faint bruit significant upper arm swelling (stable Chronic)  Dialysis Orders: [facility]  on MWF . EDW 69.5 KG  HD Bath 3K, 2.25 CA  Time 3hr 8mn Heparin 3,300 .Access R FA AVG      Hec 1 mcg iv q hd   Venofer 100 mg q hd (5 doses last on 09/01/18) then 50 mg weekly wed.   No Mircera currently (last 50 mcg  08/09/18 ) last OP  HGB 12.6 08/18/18   Assessment/Plan 1. ESRD -   HD MWF , next HD  On schedule tomor . use 3k bath as in op pending labs  2. Bradycardia= wu per admit , noted EP to see 3. Hypotension = now resolved (use Thigh bp cuff) Midodirne  10 mg pre hd MWF/ op Hydralazine hold with low bp 4. A. Fib Chronic = per Admit /EP 5. HO  CHF - Compensated , maybe some abd wall edema , atempt 2 l uf tomor with midodrine , uf profile 4 ( has been close to her OP EDW recently ) 6. HD access= with swelling of R arm avgg with ask [facility] to eval./ for hd tomor use 16 g needles  7. Anemia of ESRD  - HGB 12.9  No esa as op , FU trend , on venofer will hold load dose  with hgb >11 and ferritin last 900s'  Use weekly 50 mg q wed hd  8. Metabolic bone disease -  hec on hd ,  No phos binder ( op phos stable ) fu trend  9. Nutrition - usally  On 1200 cc  Fluid restriction and Reg. Diet with low po intake (on 3 k bath as op ) fu lab trend  10. DM type 2 - no meds  HGB A1c  4.4 on admit  11. Anxiety - per admit on Valium   DErnest Haber PA-C [id number]/02/2018, 10:06 AM

## 2018-08-24 NOTE — Consult Note (Signed)
Chief Complaint: Patient was seen in consultation today for right forearm dialysis graft evaluation with possible intervention Chief Complaint  Patient presents with  . Hypotension  . Bradycardia   at the request of Dr Rosalia Hammers  Supervising Physician: Markus Daft  Patient Status: [facility] - In-pt  History of Present Illness: [name] James is a 82 y.o. female   ESRD Using RUA dialysis graft Slow flow; difficult canalization +pulse No thrill Presented to ED with hypotension and bradycardia Weakness   Request for Rt arm dialysis graft evaluation with possible intervention In IR 9/4  Per note 9/3:  ASSESSMENT/PLAN:  ESRD on HD M-W-F right forearm av loop graft with poor HD flow.  History of left UE graft with central venous occlusion and chronic left UE edema.               She will need a shuntogram of the right forearm loop graft with possible intervention if possible tomorrow AM since this is her only working access.  The planned ligation of the L graft is scheduled on 08/27/2018.    Scheduled for LUA ligation of dialysis graft; now for OR Fri with Dr Scot Dock Hx left innominate and subclavian vein balloon coated angioplasty 12/2017--    Past Medical History:  Diagnosis Date  . Anemia, chronic disease   . Anxiety   . Arthritis    knees  . Atrial fibrillation ([place])   . Barrett esophagus   . Benign paroxysmal positional vertigo   . Breast cancer ([place]) 1980s   bil mastectomies, no radiation or chemo  . Cataract   . Chest pain, atypical 12/05/2008   R/Lmv- normal perfusion all regions, noe ECG changes   . CHF (congestive heart failure) ([place]) 05/13/2011   echo - LN>98%; stage 1 diastolic dysfunction; elevated LV filling pressure, MAC  . CKD (chronic kidney disease), stage III ([place])   . Claudication ([place]) 10/30/2005   doppler - normal evaluation, no evidence of aneurysm, diameter reduction, dissection, compression or vascular abnormality)  . Complication of anesthesia   .  Dyslipidemia   . Dysrhythmia    Atrial Fibrillation  . Edema   . ESRD (end stage renal disease) on dialysis ([place])    "[facility]; [address]; MWF" (07/06/2018)  . Family history of adverse reaction to anesthesia    son also has nausea   . Fever blister 12/2016  . Gastritis and gastroduodenitis   . GERD (gastroesophageal reflux disease)   . Gout   . Hemodialysis-associated hypotension   . HLD (hyperlipidemia)   . Hypertension   . IBS (irritable bowel syndrome)   . Insomnia   . Morbid obesity ([place])   . On home oxygen therapy    "2L; 24/7" (07/06/2018)  . PAF (paroxysmal atrial fibrillation) ([place])   . Peripheral neuropathy   . Pneumonia 12/2016  . PONV (postoperative nausea and vomiting)   . Renal cyst   . Renal insufficiency 10/30/2005   doppler - abn resistance consistent w/ parenchymal disease  . Renovascular hypertension   . Type 2 diabetes mellitus with diabetic neuropathy, with long-term current use of insulin ([place])   . Vertigo     Past Surgical History:  Procedure Laterality Date  . A/V FISTULAGRAM Left 03/19/2018   Procedure: A/V FISTULAGRAM;  Surgeon: Angelia Mould, MD;  Location: [facility];  Service: Cardiovascular;  Laterality: Left;  . A/V SHUNTOGRAM Left 01/19/2018   Procedure: A/V SHUNTOGRAM;  Surgeon: Serafina Mitchell, MD;  Location: [place]  [facility];  Service: Cardiovascular;  Laterality: Left;  . ANGIOPLASTY Left 02/12/2015   Procedure: ANGIOPLASTY;  Surgeon: Christopher S Dickson, MD;  [facility]: [facility];  Service: Cardiovascular;  Laterality: Left;  AVF  . AV FISTULA PLACEMENT Left 11/30/2014   Procedure: ARTERIOVENOUS (AV) FISTULA CREATION LEFT ARM;  Surgeon: Christopher S Dickson, MD;  Location: [facility];  Service: Vascular;  Laterality: Left;  . AV FISTULA PLACEMENT Left 03/20/2015   Procedure:  Inserton of Left Upper Arm Gortex Graft;  Surgeon: Christopher S Dickson, MD;  Location: [facility];  Service: Vascular;  Laterality: Left;  . AV FISTULA  PLACEMENT Left 04/12/2015   Procedure: INSERTION OF LEFT ARM  ARTERIOVENOUS GORE-TEX GRAFT ;  Surgeon: Christopher S Dickson, MD;  Location: [facility];  Service: Vascular;  Laterality: Left;  . AV FISTULA PLACEMENT Right 06/08/2018   Procedure: INSERTION OF ARTERIOVENOUS (AV) GORE-TEX GRAFT ARM USING 4-7MM X 45CM GORETEX GRAFT;  Surgeon: Dickson, Christopher S, MD;  Location: [facility];  Service: Vascular;  Laterality: Right;  . BACK SURGERY  ~1980  . CATARACT EXTRACTION Right   . CHOLECYSTECTOMY    . ESOPHAGOGASTRODUODENOSCOPY N/A 10/24/2014   Procedure: ESOPHAGOGASTRODUODENOSCOPY (EGD);  Surgeon: Jay M Pyrtle, MD;  [facility];  Service: Endoscopy;  Laterality: N/A;  . EXCHANGE OF A DIALYSIS CATHETER Left 11/30/2014   Procedure: EXCHANGE OF A DIALYSIS CATHETER, LEFT INTERNAL JUGULAR;  Surgeon: Christopher S Dickson, MD;  [facility];  Service: Vascular;  Laterality: Left;  . EYE SURGERY Left    transplant  . FISTULOGRAM N/A 02/12/2015   Procedure: FISTULOGRAM;  Surgeon: Christopher S Dickson, MD;  [facility];  Service: Cardiovascular;  Laterality: N/A;  . HEMORRHOID SURGERY    . LIGATION OF ARTERIOVENOUS  FISTULA Left 03/20/2015   Procedure: LIGATION OF ARTERIOVENOUS  FISTULA;  Surgeon: Christopher S Dickson, MD;  [facility];  Service: Vascular;  Laterality: Left;  . MASTECTOMY Bilateral ~1982   bil breast reconstruction with implants  . ORIF TIBIA & FIBULA FRACTURES Left 2007   also had left non displaced malleolar fracture.   . PERIPHERAL VASCULAR BALLOON ANGIOPLASTY Left 01/19/2018   Procedure: PERIPHERAL VASCULAR BALLOON ANGIOPLASTY;  Surgeon: Brabham, Vance W, MD;  [facility];  Service: Cardiovascular;  Laterality: Left;  fistula  . PERIPHERAL VASCULAR BALLOON ANGIOPLASTY Left 03/19/2018   Procedure: PERIPHERAL VASCULAR BALLOON ANGIOPLASTY;  Surgeon: Dickson, Christopher S, MD;  [facility];  Service: Cardiovascular;  Laterality:  Left;  UPPER ARM FISTULA  . REMOVAL OF GRAFT Left 04/12/2015   Procedure: REMOVAL OF LEFT ARM ARTERIOVENOUS GORE-TEX GRAFT;  Surgeon: Christopher S Dickson, MD;  [facility];  Service: Vascular;  Laterality: Left;    Allergies: Erythromycin; Codeine; Erythromycin base; Penicillin g; Penicillins; and Zantac [ranitidine hcl]  Medications: Prior to Admission medications   Medication Sig Start Date End Date Taking? Authorizing Provider  acetaminophen (TYLENOL) 325 MG tablet Take 650 mg by mouth every 6 (six) hours as needed for fever ("for vomiting/nausea," per MAR).    Yes [provider]  allopurinol (ZYLOPRIM) 100 MG tablet Take 100 mg by mouth daily.   Yes [provider]  aspirin 81 MG chewable tablet Chew 81 mg by mouth daily.   Yes [provider]  B Complex-C-Folic Acid (NEPHRO VITAMINS) 0.8 MG TABS Take 1 tablet by mouth daily.   Yes [provider]  diazepam (VALIUM) 5 MG tablet Take 1 tablet (5 mg total) by mouth   at bedtime. 07/08/18  Yes Arrien, Mauricio Daniel, MD  DIMETHICONE, TOPICAL, 5 % CREA Apply 1 application topically See admin instructions. Place a nickel-sized amount onto buttock(s) area 2 times a day for barrier protection   Yes [provider]  dorzolamide-timolol (COSOPT) 22.3-6.8 MG/ML ophthalmic solution Place 1 drop into the left eye 2 (two) times daily. Wait 2-5 minutes when giving two eye meds   Yes [provider]  hydrALAZINE (APRESOLINE) 10 MG tablet Take 5 mg by mouth 3 (three) times daily as needed (hypertension).   Yes [provider]  loperamide (IMODIUM A-D) 2 MG tablet Take 2 mg by mouth 3 (three) times daily as needed (for diarrhea).    Yes [provider]  loratadine (CLARITIN) 10 MG tablet Take 10 mg by mouth daily.   Yes [provider]  Melatonin 5 MG TABS Take 6 mg by mouth at bedtime.    Yes [provider]  midodrine (PROAMATINE) 10 MG tablet Take 10 mg by mouth  See admin instructions. Take 10 mg by mouth WITH MORNING MEDS AT FACILITY on Mon/Wed/Fri- dialysis days Take 10 mg by mouth with dialysis on Mon/Wed/Fri   Yes [provider]  OXYGEN Inhale 2 L into the lungs continuous.    Yes [provider]  Polyethyl Glycol-Propyl Glycol (SYSTANE ULTRA) 0.4-0.3 % SOLN Place 1 drop into both eyes 3 (three) times daily.   Yes [provider]  promethazine (PHENERGAN) 25 MG tablet Take 25 mg by mouth every 8 (eight) hours as needed for nausea or vomiting.   Yes [provider]  oxyCODONE (ROXICODONE) 5 MG immediate release tablet Take 1 tablet (5 mg total) by mouth every 4 (four) hours as needed for severe pain. Patient not taking: Reported on 08/09/2018 07/08/18   Arrien, Mauricio Daniel, MD     Family History  Problem Relation Age of Onset  . Diabetes Mother   . Heart disease Mother   . Non-Hodgkin's lymphoma Mother   . Heart attack Father   . Stroke Father   . Parkinson's disease Brother   . Lung disease Brother   . Heart disease Brother   . Multiple myeloma Sister     Social History   Socioeconomic History  . Marital status: Divorced    Spouse name: Not on file  . Number of children: Not on file  . Years of education: Not on file  . Highest education level: Not on file  Occupational History  . Not on file  Social Needs  . Financial resource strain: Not on file  . Food insecurity:    Worry: Not on file    Inability: Not on file  . Transportation needs:    Medical: Not on file    Non-medical: Not on file  Tobacco Use  . Smoking status: Never Smoker  . Smokeless tobacco: Never Used  Substance and Sexual Activity  . Alcohol use: No  . Drug use: No  . Sexual activity: Not on file  Lifestyle  . Physical activity:    Days per week: Not on file    Minutes per session: Not on file  . Stress: Not on file  Relationships  . Social connections:    Talks on phone: Not on file    Gets together: Not on  file    Attends religious service: Not on file    Active member of club or organization: Not on file    Attends meetings of clubs or organizations: Not on   file    Relationship status: Not on file  Other Topics Concern  . Not on file  Social History Narrative   [facility] (10/06/17):   Originally from [facility] Has always lived in [place]. She currently lives in [place]. No bird exposure. [place] Previously traveled to [place], [place], & [place]. Previously worked in a nursery and also a Pharmacist, community.     Review of Systems: A 12 point ROS discussed and pertinent positives are indicated in the HPI above.  All other systems are negative.  Review of Systems  Constitutional: Positive for activity change and fatigue. Negative for fever.  Respiratory: Negative for cough and shortness of breath.   Cardiovascular: Negative for chest pain.  Gastrointestinal: Negative for abdominal pain.  Neurological: Positive for weakness.  Psychiatric/Behavioral: Negative for behavioral problems and confusion.    Vital Signs: BP (!) 87/29 (BP Location: Left Leg)   Pulse (!) 40   Temp 97.9 F (36.6 C) (Oral)   Resp 19   SpO2 100%   Physical Exam  Cardiovascular: Normal rate and regular rhythm.  Pulmonary/Chest: Effort normal and breath sounds normal.  Abdominal: Soft. Bowel sounds are normal.  Musculoskeletal:  Left arm swelling Ecchymotic at dialysis graft site  Right arm with +pulse of dialysis graft; no thrill NT  Vitals reviewed.   Imaging: Dg Chest Portable 1 View  Result Date: 08/23/2018 CLINICAL DATA:  Hypotension and bradycardia EXAM: PORTABLE CHEST 1 VIEW COMPARISON:  07/05/2018.  Chest CT 10/05/2017 FINDINGS: Chronic left pleural effusion and lower lobe collapse based on 2018 and 2019 chest CTs. The remainder of the lungs are clear. Normal heart size and mediastinal contours. IMPRESSION: Chronic left lower lobe collapse and small left effusion. No acute finding compared to priors.  Electronically Signed   By: Monte Fantasia M.D.   On: 08/23/2018 19:33    Labs:  CBC: Recent Labs    07/06/18 0428 07/07/18 1521 08/23/18 1850 08/23/18 1859 08/24/18 0423  WBC 10.4 7.4 7.4  --  9.3  HGB 10.5* 9.6* 13.8 15.3* 12.9  HCT 35.5* 31.3* 43.5 45.0 41.2  PLT 242 179 158  --  150    COAGS: Recent Labs    07/05/18 2129  INR 1.03    BMP: Recent Labs    07/07/18 1521 07/08/18 0320 08/23/18 1850 08/23/18 1859 08/24/18 0423  NA 136 137 135 135 136  K 4.2 3.8 4.5 4.3 4.9  CL 100 97* 99 102 103  CO2 25 29 20*  --  20*  GLUCOSE 149* 117* 134* 138* 170*  BUN 38* 18 30* 33* 33*  CALCIUM 7.8* 7.9* 7.8*  --  7.6*  CREATININE 4.36* 2.58* 4.25* 4.50* 4.66*  GFRNONAA 9* 16* 9*  --  8*  GFRAA 10* 19* 10*  --  9*    LIVER FUNCTION TESTS: Recent Labs    07/07/18 1521 08/23/18 1850  BILITOT  --  0.7  AST  --  50*  ALT  --  27  ALKPHOS  --  132*  PROT  --  7.6  ALBUMIN 2.6* 3.2*    TUMOR MARKERS: No results for input(s): AFPTM, CEA, CA199, CHROMGRNA in the last 8760 hours.  Assessment and Plan:  ESRD Scheduled for left arm dialysis graft ligation in OR with Dr Scot Dock Fr 9/6.  Plan now for right forearm dialysis graft evaluation with possible intervention; known difficult canalization and slow flow  Pt is aware of procedure benefits and risks including but  not limited to Infection; bleeding; damage to graft Agreeable to proceed Consent signed and in chart  Thank you for this interesting consult.  I greatly enjoyed meeting [name] James and look forward to participating in their care.  A copy of this report was sent to the requesting provider on this date.  Electronically Signed: Lavonia Drafts, PA-C 08/24/2018, 3:09 PM   I spent a total of 20 Minutes    in face to face in clinical consultation, greater than 50% of which was counseling/coordinating care for right arm dialysis graft evaluation and possible intervention

## 2018-08-24 NOTE — Consult Note (Addendum)
[facility] [place] CONSULT NOTE VASCULAR SURGERY ASSESSMENT & PLAN:   LEFT ARM SWELLING AND POORLY FUNCTIONING RIGHT ARM AV GRAFT: This patient is well-known to me.  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  She has chronic swelling in the left arm and we planned on ligating her left arm access on Friday.  She is admitted now and her right forearm graft which was placed on 06/08/2018 has not been working well.    I will leave her on the schedule for Friday to ligate her left arm access.  In the meantime the patient has been seen by interventional radiology with plans for a fistulogram of the right arm graft and possible thrombolyzes.  Her graft is patent with a thrill but flow is weak.  Hopefully her right arm graft can be salvaged otherwise she would require placement of a dialysis catheter for the time being.  [name], MD, [place] [phone] Office: [phone]   MRN : [id number]  Reason for Consult: poor HD flow right forearm loop graft. Referring Physician: Nephrology  History of Present Illness: 82 y/o female last seen in our office 08/09/2018 by Dr. Scot Dock.  The patient had a right arm graft placed on 06/08/2018.  This graft was working well and she has been using it for the last 3 weeks without any problem.  She has a poorly functioning graft in the left arm with chronic swelling in the left arm related to a central venous occlusion which is been addressed multiple times.  the plan was to proceed with left UE graft ligation since she had a working right forearm loop graft.    She presented to [facility] ED last night with a chief complaint of hypotension and brady cardia noted by a CNA at the SNF she resides at.  It was noted that in HD for the past 2 sessions she has decreased flow in the right forearm graft.  She was scheduled to see [facility] vascular MDs for intervention prior to this hospitalization.     Past medical  history:  hypertension, hyperlipidemia, diabetes mellitus, GERD, gout, anxiety, ESRD-HD (MWF), atrial fibrillation not on anticoagulants, breast cancer (bilateral mastectomy 1982), dCHF, PAD, obesity, COPD on home O2.     Current Facility-Administered Medications  Medication Dose Route Frequency Provider Last Rate Last Dose  . acetaminophen (TYLENOL) tablet 650 mg  650 mg Oral Q6H PRN Ivor Costa, MD       Or  . acetaminophen (TYLENOL) suppository 650 mg  650 mg Rectal Q6H PRN Ivor Costa, MD      . allopurinol (ZYLOPRIM) tablet 100 mg  100 mg Oral Daily Ivor Costa, MD   100 mg at 08/24/18 1100  . aspirin chewable tablet 81 mg  81 mg Oral Daily Ivor Costa, MD   81 mg at 08/24/18 1000  . atropine 1 MG/10ML injection 0.4 mg  0.4 mg Intravenous PRN Ivor Costa, MD      . Chlorhexidine Gluconate Cloth 2 % PADS 6 each  6 each Topical Q0600 Ivor Costa, MD      . diazepam (VALIUM) tablet 5 mg  5 mg Oral QHS Ivor Costa, MD      . dimethicone 1 % cream 1 application  1 application Topical BID Ivor Costa, MD   1 application at 39/76/73 1030  . dorzolamide-timolol (COSOPT) 22.3-6.8 MG/ML ophthalmic solution 1 drop  1 drop Left Eye BID Ivor Costa, MD   1 drop at  08/24/18 1039  . [START ON 08/25/2018] doxercalciferol (HECTOROL) injection 1 mcg  1 mcg Intravenous Q M,W,F-HD Ernest Haber, PA-C      . [START ON 08/25/2018] ferric gluconate (NULECIT) 62.5 mg in sodium chloride 0.9 % 100 mL IVPB  62.5 mg Intravenous Q Wed-HD Ernest Haber, PA-C      . heparin injection 5,000 Units  5,000 Units Subcutaneous Q8H Ivor Costa, MD   5,000 Units at 08/24/18 6283  . hydroxypropyl methylcellulose / hypromellose (ISOPTO TEARS / GONIOVISC) 2.5 % ophthalmic solution 1 drop  1 drop Both Eyes TID Ivor Costa, MD   1 drop at 08/24/18 1040  . loperamide (IMODIUM) capsule 2 mg  2 mg Oral TID PRN Ivor Costa, MD      . loratadine (CLARITIN) tablet 10 mg  10 mg Oral Daily Ivor Costa, MD   10 mg at 08/24/18 1030  . Melatonin TABS 6 mg  6  mg Oral QHS Ivor Costa, MD   6 mg at 08/24/18 0008  . multivitamin (RENA-VIT) tablet 1 tablet  1 tablet Oral QHS Ivor Costa, MD   1 tablet at 08/24/18 0008  . mupirocin ointment (BACTROBAN) 2 % 1 application  1 application Nasal BID Ivor Costa, MD   1 application at 66/29/47 1000  . ondansetron (ZOFRAN) tablet 4 mg  4 mg Oral Q6H PRN Ivor Costa, MD       Or  . ondansetron [facility]) injection 4 mg  4 mg Intravenous Q6H PRN Ivor Costa, MD      . zolpidem (AMBIEN) tablet 5 mg  5 mg Oral QHS PRN Ivor Costa, MD        Pt meds include: Statin :No Betablocker: Yes ASA: Yes Other anticoagulants/antiplatelets: None  Past Medical History:  Diagnosis Date  . Anemia, chronic disease   . Anxiety   . Arthritis    knees  . Atrial fibrillation ([place])   . Barrett esophagus   . Benign paroxysmal positional vertigo   . Breast cancer ([place]) 1980s   bil mastectomies, no radiation or chemo  . Cataract   . Chest pain, atypical 12/05/2008   R/Lmv- normal perfusion all regions, noe ECG changes   . CHF (congestive heart failure) ([place]) 05/13/2011   echo - ML>46%; stage 1 diastolic dysfunction; elevated LV filling pressure, MAC  . CKD (chronic kidney disease), stage III ([place])   . Claudication ([place]) 10/30/2005   doppler - normal evaluation, no evidence of aneurysm, diameter reduction, dissection, compression or vascular abnormality)  . Complication of anesthesia   . Dyslipidemia   . Dysrhythmia    Atrial Fibrillation  . Edema   . ESRD (end stage renal disease) on dialysis ([place])    "[facility]; [address]; MWF" (07/06/2018)  . Family history of adverse reaction to anesthesia    son also has nausea   . Fever blister 12/2016  . Gastritis and gastroduodenitis   . GERD (gastroesophageal reflux disease)   . Gout   . Hemodialysis-associated hypotension   . HLD (hyperlipidemia)   . Hypertension   . IBS (irritable bowel syndrome)   . Insomnia   . Morbid obesity ([place])   . On home oxygen therapy    "2L; 24/7"  (07/06/2018)  . PAF (paroxysmal atrial fibrillation) ([place])   . Peripheral neuropathy   . Pneumonia 12/2016  . PONV (postoperative nausea and vomiting)   . Renal cyst   . Renal insufficiency 10/30/2005   doppler - abn resistance consistent w/ parenchymal disease  . Renovascular hypertension   .  Type 2 diabetes mellitus with diabetic neuropathy, with long-term current use of insulin ([place])   . Vertigo     Past Surgical History:  Procedure Laterality Date  . A/V FISTULAGRAM Left 03/19/2018   Procedure: A/V FISTULAGRAM;  Surgeon: Angelia Mould, MD;  Location: [place] [facility];  Service: Cardiovascular;  Laterality: Left;  . A/V SHUNTOGRAM Left 01/19/2018   Procedure: A/V SHUNTOGRAM;  Surgeon: Serafina Mitchell, MD;  Location: [place] [facility];  Service: Cardiovascular;  Laterality: Left;  . ANGIOPLASTY Left 02/12/2015   Procedure: ANGIOPLASTY;  Surgeon: Angelia Mould, MD;  Location: [facility] CATH LAB;  Service: Cardiovascular;  Laterality: Left;  AVF  . AV FISTULA PLACEMENT Left 11/30/2014   Procedure: ARTERIOVENOUS (AV) FISTULA CREATION LEFT ARM;  Surgeon: Angelia Mould, MD;  Location: [place];  Service: Vascular;  Laterality: Left;  . AV FISTULA PLACEMENT Left 03/20/2015   Procedure:  Inserton of Left Upper Arm Gortex Graft;  Surgeon: Angelia Mould, MD;  Location: [place];  Service: Vascular;  Laterality: Left;  . AV FISTULA PLACEMENT Left 04/12/2015   Procedure: INSERTION OF LEFT ARM  ARTERIOVENOUS GORE-TEX GRAFT ;  Surgeon: Angelia Mould, MD;  Location: [place];  Service: Vascular;  Laterality: Left;  . AV FISTULA PLACEMENT Right 06/08/2018   Procedure: INSERTION OF ARTERIOVENOUS (AV) GORE-TEX GRAFT ARM USING 4-7MM X 45CM GORETEX GRAFT;  Surgeon: Angelia Mould, MD;  Location: [place];  Service: Vascular;  Laterality: Right;  . BACK SURGERY  ~1980  . CATARACT EXTRACTION Right   . CHOLECYSTECTOMY    . ESOPHAGOGASTRODUODENOSCOPY N/A 10/24/2014   Procedure:  ESOPHAGOGASTRODUODENOSCOPY (EGD);  Surgeon: Jerene Bears, MD;  Location: [facility] ENDOSCOPY;  Service: Endoscopy;  Laterality: N/A;  . EXCHANGE OF A DIALYSIS CATHETER Left 11/30/2014   Procedure: EXCHANGE OF A DIALYSIS CATHETER, LEFT INTERNAL JUGULAR;  Surgeon: Angelia Mould, MD;  Location: [place];  Service: Vascular;  Laterality: Left;  . EYE SURGERY Left    transplant  . FISTULOGRAM N/A 02/12/2015   Procedure: FISTULOGRAM;  Surgeon: Angelia Mould, MD;  Location: [facility] CATH LAB;  Service: Cardiovascular;  Laterality: N/A;  . HEMORRHOID SURGERY    . LIGATION OF ARTERIOVENOUS  FISTULA Left 03/20/2015   Procedure: LIGATION OF ARTERIOVENOUS  FISTULA;  Surgeon: Angelia Mould, MD;  Location: [place];  Service: Vascular;  Laterality: Left;  Marland Kitchen MASTECTOMY Bilateral ~1982   bil breast reconstruction with implants  . ORIF TIBIA & FIBULA FRACTURES Left 2007   also had left non displaced malleolar fracture.   Marland Kitchen PERIPHERAL VASCULAR BALLOON ANGIOPLASTY Left 01/19/2018   Procedure: PERIPHERAL VASCULAR BALLOON ANGIOPLASTY;  Surgeon: Serafina Mitchell, MD;  Location: [place] [facility];  Service: Cardiovascular;  Laterality: Left;  fistula  . PERIPHERAL VASCULAR BALLOON ANGIOPLASTY Left 03/19/2018   Procedure: PERIPHERAL VASCULAR BALLOON ANGIOPLASTY;  Surgeon: Angelia Mould, MD;  Location: [place] [facility];  Service: Cardiovascular;  Laterality: Left;  UPPER ARM FISTULA  . REMOVAL OF GRAFT Left 04/12/2015   Procedure: REMOVAL OF LEFT ARM ARTERIOVENOUS GORE-TEX GRAFT;  Surgeon: Angelia Mould, MD;  Location[facility] OR;  Service: Vascular;  Laterality: Left;    Social History Social History   Tobacco Use  . Smoking status: Never Smoker  . Smokeless tobacco: Never Used  Substance Use Topics  . Alcohol use: No  . Drug use: No    Family History Family History  Problem Relation Age of Onset  . Diabetes Mother   .  Heart disease Mother   . Non-Hodgkin's lymphoma Mother   . Heart  attack Father   . Stroke Father   . Parkinson's disease Brother   . Lung disease Brother   . Heart disease Brother   . Multiple myeloma Sister     Allergies  Allergen Reactions  . Erythromycin Swelling  . Codeine Palpitations, Rash and Other (See Comments)    GI Upset  . Erythromycin Base Other (See Comments)    GI Upset  . Penicillin G Rash    Has patient had a PCN reaction causing immediate rash, facial/tongue/throat swelling, SOB or lightheadedness with hypotension: Yes Has patient had a PCN reaction causing severe rash involving mucus membranes or skin necrosis: Unk Has patient had a PCN reaction that required hospitalization: Unk Has patient had a PCN reaction occurring within the last 10 years: Unk If all of the above answers are "NO", then may proceed with Cephalosporin use..  . Penicillins Rash and Other (See Comments)    Has taken Keflex & Rocephin many times without problems Has patient had a PCN reaction causing immediate rash, facial/tongue/throat swelling, SOB or lightheadedness with hypotension: Yes Has patient had a PCN reaction causing severe rash involving mucus membranes or skin necrosis: Unk Has patient had a PCN reaction that required hospitalization: Unk Has patient had a PCN reaction occurring within the last 10 years: Unk If all of the above answers are "NO", then may proceed with Cephalosporin use.   Keturah Shavers [Ranitidine Hcl] Nausea And Vomiting     REVIEW OF SYSTEMS  General: [ ] Weight loss, [ ] Fever, [ ] chills Neurologic: [ ] Dizziness, [ ] Blackouts, [ ] Seizure [ ] Stroke, [ ] "Mini stroke", [ ] Slurred speech, [ ] Temporary blindness; [ ] weakness in arms or legs, [ ] Hoarseness [ ] Dysphagia Cardiac: [ ] Chest pain/pressure, [ ] Shortness of breath at rest [ ] Shortness of breath with exertion, [ ] Atrial fibrillation or irregular heartbeat  Vascular: [ ] Pain in legs with walking, [ ] Pain in legs at rest, [ ] Pain in legs at night,  [ ]  Non-healing ulcer, [ ] Blood clot in vein/DVT,   Pulmonary: x[ ] Home oxygen, [ ] Productive cough, [ ] Coughing up blood, [ ] Asthma,  [ ] Wheezing [x ] COPD Musculoskeletal:  [ ] Arthritis, [ ] Low back pain, [ ] Joint pain Hematologic: [ ] Easy Bruising, [ ] Anemia; [ ] Hepatitis Gastrointestinal: [ ] Blood in stool, [ ] Gastroesophageal Reflux/heartburn, Urinary: [ ] chronic Kidney disease, [x ] on HD - [x ] MWF or [ ] TTHS, [ ] Burning with urination, [ ] Difficulty urinating Skin: [ ] Rashes, [ ] Wounds Psychological: [ ] Anxiety, [ ] Depression  Physical Examination Vitals:   08/24/18 9753 08/24/18 0604 08/24/18 0608 08/24/18 0610  BP: (!) 89/70 (!) 81/33 (!) 83/38 (!) 87/29  Pulse: (!) 39 (!) 41 (!) 41 (!) 40  Resp: _0 Temp:      TempSrc:      SpO2: 100% 100% 100% 100%   There is no height or weight on file to calculate BMI.  General:  WDWN in NAD HENT: WNL, normocephalic Eyes: Pupils equal Pulmonary: normal non-labored breathing on O2 via [place] Cardiac: RRR, without  Murmurs, rubs or gallops; Abdomen: soft, NT, no masses Skin: no rashes, ulcers noted;  no Gangrene , no cellulitis; no open wounds;  Vascular Exam/Pulses:B radial pulses palpable, audible thrill via stethoscope in right forearm loop graft   Musculoskeletal: no muscle wasting or atrophy; chronic left UE edema  Neurologic: A&O X 3; Appropriate Affect ;  SENSATION: normal; MOTOR FUNCTION: 5/5 Symmetric B UE Speech is fluent/normal   Significant Diagnostic Studies: CBC Lab Results  Component Value Date   WBC 9.3 08/24/2018   HGB 12.9 08/24/2018   HCT 41.2 08/24/2018   MCV 105.1 (H) 08/24/2018   PLT 150 08/24/2018    BMET    Component Value Date/Time   NA 136 08/24/2018 0423   NA 132 (A) 01/06/2017   K 4.9 08/24/2018 0423   CL 103 08/24/2018 0423   CO2 20 (L) 08/24/2018 0423   GLUCOSE 170 (H) 08/24/2018 0423   BUN 33 (H) 08/24/2018 0423   BUN 22 (A) 01/06/2017   CREATININE 4.66  (H) 08/24/2018 0423   CREATININE 1.86 (H) 04/02/2014 0907   CALCIUM 7.6 (L) 08/24/2018 0423   CALCIUM 8.1 (L) 11/15/2014 0625   GFRNONAA 8 (L) 08/24/2018 0423   GFRAA 9 (L) 08/24/2018 0423   CrCl cannot be calculated (Unknown ideal weight.).  COAG Lab Results  Component Value Date   INR 1.03 07/05/2018   INR 1.17 01/19/2017   INR 1.14 12/19/2014     Non-Invasive Vascular Imaging:   ASSESSMENT/PLAN:  ESRD on HD M-W-F right forearm av loop graft with poor HD flow.  History of left UE graft with central venous occlusion and chronic left UE edema.    She will need a shuntogram of the right forearm loop graft with possible intervention if possible tomorrow AM since this is her only working access.  The planned ligation of the L graft is scheduled on 08/27/2018.    Roxy Horseman 08/24/2018 12:27 PM

## 2018-08-24 NOTE — Progress Notes (Signed)
Patient transferred from ED to [phone]. Patient A&Ox4. Stepdown monitor [id number] applied and second verified. Skin assessment completed by 2 RNs. Patient instructed how to use the callbell. Callbell within reach. Admission handbook given to patient. Will continue to monitor and treat per MD orders.

## 2018-08-24 NOTE — Progress Notes (Signed)
PROGRESS NOTE    [name] James  BPZ:[id number] DOB: November 12, 1936 DOA: 08/23/2018 PCP: Patrick Jupiter      Brief Narrative:  [name] James is a 82 y.o. F with ESRD on HD MWF, gout, HTN, DM, Afib not on AC, dCHF with chronic respiratory failure on home O2, and severe PVD who presented from HD center with malaise, found to have bradycardia.  On arrival here, found to be in a junctional rhythm.  Given atropine and admitted to stepdown.       Assessment & Plan:  Bradycardia -Consult EP, appreciate expert guidance -Consult Palliative Care    ESRD -Consult Nephrology, appreciate cares  Hypertension  Intradialytic hypotension -Continue midodrine with dialysis -Hold any PRN home hydralazine  Chronic diastolic CHF Chronic respiratory failure Appears euvolemic.  On home O2.  Peripheral vascular disease Failing dialysis access -Consult Vascular surgery and IR, appreciate cares; the patient was to undergo ligation of her swelling left arm graft on Friday; in the meantime, R forearm graft also failing, so IR to perform fistulogram on RIGHT, and hopefully salvage -Continue aspirin  Gout -Continue allopurinol  Other medications -Continue Valium at night        DVT prophylaxis: Heparin Code Status: FULL Family Communication: None present MDM and disposition Plan: The below labs and imaging reports were reviewed and summarized above.  Medication mgmt as above.  The patient was admitted with symptomatic bradycardia, found to have junctional rhythm. Although she is a clearcut candidate for pacer, she has limited vascular access for placement, extremely high infection risk.  She is furthermore, limited in her functional capacity.   At present, she is still symtomatic from her junctional rhythm, as such, she is hemodynamically unstable, and further inpatient hospitalization to explore therapeutic or palliative options for her symptomatically bradycardia is judged reasonable and  necessary.   Consultants:   Electrophysiology  Nephrology  Interventional Radiology  Vascular surgery  Procedures:   None  Antimicrobials:   None    Subjective: Still feels "bad", weak, tired.  No dyspnea, chest pain.  No confusion.  No fever, vomiting.  Objective: Vitals:   08/24/18 0604 08/24/18 0608 08/24/18 0610 08/24/18 1440  BP: (!) 81/33 (!) 83/38 (!) 87/29   Pulse: (!) 41 (!) 41 (!) 40   Resp: 17 18 19    Temp:    97.9 F (36.6 C)  TempSrc:    Oral  SpO2: 100% 100% 100%     Intake/Output Summary (Last 24 hours) at 08/24/2018 1842 Last data filed at 08/24/2018 1300 Gross per 24 hour  Intake 276 ml  Output -  Net 276 ml   There were no vitals filed for this visit.  Examination: General appearance: Elderly adult female, alert and in no acute distress.   HEENT: Anicteric, conjunctiva pink, lids and lashes normal. No nasal deformity, discharge, epistaxis.  Lips moist.   Skin: Warm and dry.  no jaundice.  No suspicious rashes or lesions. Cardiac: bradycardic, nl S1-S2, no murmurs appreciated.  Capillary refill is brisk.  JVP not visible.  No LE edema.  Radia  pulses 2+ and symmetric. Respiratory: Normal respiratory rate and rhythm.  CTAB without rales or wheezes. Abdomen: Abdomen soft.  no TTP. No ascites, distension, hepatosplenomegaly.   MSK: No deformities or effusions. Neuro: Awake and alert, appears sluggish, weak generally.  EOMI, moves all extremities with normal coordination, but listless. Speech fluent.    Psych: Sensorium intact and responding to questions, attention normal. Affect blunted.  Judgment and insight appear normal.  Data Reviewed: I have personally reviewed following labs and imaging studies:  CBC: Recent Labs  Lab 08/23/18 1850 08/23/18 1859 08/24/18 0423  WBC 7.4  --  9.3  NEUTROABS 4.0  --   --   HGB 13.8 15.3* 12.9  HCT 43.5 45.0 41.2  MCV 104.8*  --  105.1*  PLT 158  --  517   Basic Metabolic Panel: Recent Labs  Lab  08/23/18 1850 08/23/18 1859 08/24/18 0423  NA 135 135 136  K 4.5 4.3 4.9  CL 99 102 103  CO2 20*  --  20*  GLUCOSE 134* 138* 170*  BUN 30* 33* 33*  CREATININE 4.25* 4.50* 4.66*  CALCIUM 7.8*  --  7.6*   GFR: CrCl cannot be calculated (Unknown ideal weight.). Liver Function Tests: Recent Labs  Lab 08/23/18 1850  AST 50*  ALT 27  ALKPHOS 132*  BILITOT 0.7  PROT 7.6  ALBUMIN 3.2*   No results for input(s): LIPASE, AMYLASE in the last 168 hours. No results for input(s): AMMONIA in the last 168 hours. Coagulation Profile: No results for input(s): INR, PROTIME in the last 168 hours. Cardiac Enzymes: Recent Labs  Lab 08/23/18 2316 08/24/18 0423 08/24/18 1034  TROPONINI <0.03 <0.03 <0.03   BNP (last 3 results) No results for input(s): PROBNP in the last 8760 hours. HbA1C: Recent Labs    08/24/18 0423  HGBA1C 4.4*   CBG: Recent Labs  Lab 08/24/18 0023 08/24/18 0819  GLUCAP 163* 131*   Lipid Profile: Recent Labs    08/24/18 0423  CHOL 157  HDL 32*  LDLCALC 96  TRIG 147  CHOLHDL 4.9   Thyroid Function Tests: Recent Labs    08/23/18 1850  TSH 1.417  FREET4 0.88   Anemia Panel: No results for input(s): VITAMINB12, FOLATE, FERRITIN, TIBC, IRON, RETICCTPCT in the last 72 hours. Urine analysis:    Component Value Date/Time   COLORURINE YELLOW 10/26/2014 2043   APPEARANCEUR CLOUDY (A) 10/26/2014 2043   LABSPEC 1.011 10/26/2014 2043   PHURINE 5.0 10/26/2014 2043   GLUCOSEU 100 (A) 10/26/2014 2043   HGBUR SMALL (A) 10/26/2014 2043   BILIRUBINUR NEGATIVE 10/26/2014 2043   BILIRUBINUR neg 04/02/2014 0848   KETONESUR NEGATIVE 10/26/2014 2043   PROTEINUR 100 (A) 10/26/2014 2043   UROBILINOGEN 0.2 10/26/2014 2043   NITRITE POSITIVE (A) 10/26/2014 2043   LEUKOCYTESUR MODERATE (A) 10/26/2014 2043   Sepsis Labs: @LABRCNTIP (procalcitonin:4,lacticacidven:4)  ) Recent Results (from the past 240 hour(s))  Culture, blood (single)     Status: None  (Preliminary result)   Collection Time: 08/23/18  6:52 PM  Result Value Ref Range Status   Specimen Description BLOOD LEFT EJ  Final   Special Requests   Final    BOTTLES DRAWN AEROBIC AND ANAEROBIC Blood Culture adequate volume   Culture   Final    NO GROWTH < 24 HOURS Performed at [facility], [place] [address], [place], [place] [address]    Report Status PENDING  Incomplete  MRSA PCR Screening     Status: Abnormal   Collection Time: 08/23/18 11:15 PM  Result Value Ref Range Status   MRSA by PCR POSITIVE (A) NEGATIVE Final    Comment:        The GeneXpert MRSA Assay (FDA approved for NASAL specimens only), is one component of a comprehensive MRSA colonization surveillance program. It is not intended to diagnose MRSA infection nor to guide or monitor treatment for MRSA infections. RESULT CALLED TO, READ BACK BY  AND VERIFIED WITH: Eilleen Kempf RN [id number] 08/24/18 A BROWNING Performed at [facility], [place] [address], [place], [place] [address]          Radiology Studies: Dg Chest Portable 1 View  Result Date: 08/23/2018 CLINICAL DATA:  Hypotension and bradycardia EXAM: PORTABLE CHEST 1 VIEW COMPARISON:  07/05/2018.  Chest CT 10/05/2017 FINDINGS: Chronic left pleural effusion and lower lobe collapse based on 2018 and 2019 chest CTs. The remainder of the lungs are clear. Normal heart size and mediastinal contours. IMPRESSION: Chronic left lower lobe collapse and small left effusion. No acute finding compared to priors. Electronically Signed   By: Monte Fantasia M.D.   On: 08/23/2018 19:33        Scheduled Meds: . allopurinol  100 mg Oral Daily  . aspirin  81 mg Oral Daily  . Chlorhexidine Gluconate Cloth  6 each Topical Q0600  . diazepam  5 mg Oral QHS  . dimethicone  1 application Topical BID  . dorzolamide-timolol  1 drop Left Eye BID  . [START ON 08/25/2018] doxercalciferol  1 mcg Intravenous Q M,W,F-HD  . heparin  5,000 Units Subcutaneous Q8H  . hydroxypropyl  methylcellulose / hypromellose  1 drop Both Eyes TID  . loratadine  10 mg Oral Daily  . Melatonin  6 mg Oral QHS  . multivitamin  1 tablet Oral QHS  . mupirocin ointment  1 application Nasal BID   Continuous Infusions: . [START ON 08/25/2018] ferric gluconate (FERRLECIT/NULECIT) IV       LOS: 0 days    Time spent: 35 minutes    Edwin Dada, MD [facility] 08/24/2018, 6:42 PM     Pager [phone] --- please page though AMION:  [email or web address] Password [email or web address] If 7PM-7AM, please contact night-coverage

## 2018-08-25 ENCOUNTER — Encounter (HOSPITAL_COMMUNITY): Payer: Self-pay | Admitting: Interventional Radiology

## 2018-08-25 ENCOUNTER — Inpatient Hospital Stay (HOSPITAL_COMMUNITY): Payer: Medicare Other

## 2018-08-25 DIAGNOSIS — E1129 Type 2 diabetes mellitus with other diabetic kidney complication: Secondary | ICD-10-CM

## 2018-08-25 DIAGNOSIS — Z515 Encounter for palliative care: Secondary | ICD-10-CM

## 2018-08-25 DIAGNOSIS — J9611 Chronic respiratory failure with hypoxia: Secondary | ICD-10-CM

## 2018-08-25 DIAGNOSIS — E1165 Type 2 diabetes mellitus with hyperglycemia: Secondary | ICD-10-CM

## 2018-08-25 DIAGNOSIS — N186 End stage renal disease: Secondary | ICD-10-CM

## 2018-08-25 DIAGNOSIS — I480 Paroxysmal atrial fibrillation: Secondary | ICD-10-CM

## 2018-08-25 DIAGNOSIS — Z7189 Other specified counseling: Secondary | ICD-10-CM

## 2018-08-25 DIAGNOSIS — Z992 Dependence on renal dialysis: Secondary | ICD-10-CM

## 2018-08-25 DIAGNOSIS — R001 Bradycardia, unspecified: Secondary | ICD-10-CM

## 2018-08-25 DIAGNOSIS — I495 Sick sinus syndrome: Secondary | ICD-10-CM

## 2018-08-25 HISTORY — PX: IR US GUIDE VASC ACCESS RIGHT: IMG2390

## 2018-08-25 HISTORY — PX: IR AV DIALY SHUNT INTRO NEEDLE/INTRACATH INITIAL W/PTA/IMG RIGHT: IMG6116

## 2018-08-25 HISTORY — DX: Bradycardia, unspecified: R00.1

## 2018-08-25 LAB — BASIC METABOLIC PANEL
Anion gap: 14 (ref 5–15)
BUN: 48 mg/dL — AB (ref 8–23)
CALCIUM: 7.6 mg/dL — AB (ref 8.9–10.3)
CO2: 21 mmol/L — ABNORMAL LOW (ref 22–32)
CREATININE: 5.86 mg/dL — AB (ref 0.44–1.00)
Chloride: 102 mmol/L (ref 98–111)
GFR calc Af Amer: 7 mL/min — ABNORMAL LOW (ref >60)
GFR calc non Af Amer: 6 mL/min — ABNORMAL LOW (ref >60)
Glucose, Bld: 121 mg/dL — ABNORMAL HIGH (ref 70–99)
Potassium: 4.9 mmol/L (ref 3.5–5.1)
Sodium: 137 mmol/L (ref 135–145)

## 2018-08-25 LAB — CBC
HCT: 38.4 % (ref 36.0–46.0)
Hemoglobin: 11.8 g/dL — ABNORMAL LOW (ref 12.0–15.0)
MCH: 32.6 pg (ref 26.0–34.0)
MCHC: 30.7 g/dL (ref 30.0–36.0)
MCV: 106.1 fL — ABNORMAL HIGH (ref 78.0–100.0)
PLATELETS: 166 10*3/uL (ref 150–400)
RBC: 3.62 MIL/uL — AB (ref 3.87–5.11)
RDW: 15.9 % — AB (ref 11.5–15.5)
WBC: 10.1 10*3/uL (ref 4.0–10.5)

## 2018-08-25 LAB — PROTIME-INR
INR: 1.22
PROTHROMBIN TIME: 15.3 s — AB (ref 11.4–15.2)

## 2018-08-25 LAB — GLUCOSE, CAPILLARY: GLUCOSE-CAPILLARY: 126 mg/dL — AB (ref 70–99)

## 2018-08-25 MED ORDER — LIDOCAINE HCL (PF) 1 % IJ SOLN
INTRAMUSCULAR | Status: DC | PRN
  Administered 2018-08-25: 5 mL

## 2018-08-25 MED ORDER — LIDOCAINE HCL 1 % IJ SOLN
INTRAMUSCULAR | Status: AC
  Filled 2018-08-25: qty 20

## 2018-08-25 MED ORDER — IOPAMIDOL (ISOVUE-300) INJECTION 61%
INTRAVENOUS | Status: AC
  Administered 2018-08-25: 50 mL
  Filled 2018-08-25: qty 100

## 2018-08-25 MED ORDER — DOXERCALCIFEROL 4 MCG/2ML IV SOLN
INTRAVENOUS | Status: AC
  Filled 2018-08-25: qty 2

## 2018-08-25 NOTE — Progress Notes (Signed)
[facility] Progress Note  Subjective: no new c/o  Vitals:   08/24/18 1940 08/24/18 2110 08/25/18 0110 08/25/18 0815  BP: (!) 163/54 (!) 135/55 (!) 151/32   Pulse: (!) 43 (!) 43 (!) 35   Resp: 16 19 18    Temp:    97.7 F (36.5 C)  TempSrc:    Oral  SpO2: 100% 100% 100%     Inpatient medications: . allopurinol  100 mg Oral Daily  . aspirin  81 mg Oral Daily  . Chlorhexidine Gluconate Cloth  6 each Topical Q0600  . diazepam  5 mg Oral QHS  . dimethicone  1 application Topical BID  . dorzolamide-timolol  1 drop Left Eye BID  . doxercalciferol  1 mcg Intravenous Q M,W,F-HD  . heparin  5,000 Units Subcutaneous Q8H  . hydroxypropyl methylcellulose / hypromellose  1 drop Both Eyes TID  . loratadine  10 mg Oral Daily  . Melatonin  6 mg Oral QHS  . multivitamin  1 tablet Oral QHS  . mupirocin ointment  1 application Nasal BID   . ferric gluconate (FERRLECIT/NULECIT) IV     acetaminophen **OR** acetaminophen, atropine, loperamide, ondansetron **OR** ondansetron (ZOFRAN) IV, zolpidem  Iron/TIBC/Ferritin/ %Sat    Component Value Date/Time   IRON 59 01/26/2017 1515   TIBC NOT CALCULATED 01/26/2017 1515   FERRITIN 1,362 (H) 02/03/2017 1108   IRONPCTSAT NOT CALCULATED 01/26/2017 1515   IRONPCTSAT 22 09/06/2008 1552    Exam: Alert, frail, obese, no distress  no jvd  Chest cat bilat  Cor irreg irreg   Abd soft obese ntnd no wall edema  Ext trace edema  Neuro bilat LE chronic weakness, Ox 3  RFA AVG +bruit, LUA AVG faint bruit and LUE swelling  [facility]: [facility] M[facility]  3h 51min  69.5kg   3K/2.25 bath  Hep 3300   RFA AVG  - hect 1 ug  - venofer 100 x 5 thru 9/11 then 50/wk  - no mircera currently      Impression/ Plan: 1. Bradycardia, sinus = seen by EP, not good access spot for PPM, poor prognosis is likely.  Palliative care seeing today.  2. ESRD -   HD MWF.  HD today.  3. Hypotension = on Midodirne  10 mg pre hd MWF/ op Hydralazine hold with low bp 4. A.  Fib Chronic = per Admit /EP 5. Volume - is close to dry wt 6. HD access - poor flow of R arm avgg, for shuntogram. Also has LUE AVG w/ chronic venous occlustion and LUE swelling for ligation of this access later this week if still here.  7. Anemia of ESRD  - HGB 12.9  No esa as op , FU trend , on venofer will hold load dose  with hgb >11 and ferritin last 900s'  Use weekly 50 mg q wed hd  8. Metabolic bone disease -  hec on hd ,  No phos binder ( op phos stable ) fu trend  9. Nutrition - usally  On 1200 cc  Fluid restriction and Reg. Diet with low po intake (on 3 k bath as op ) fu lab trend  10. DM type 2 - no meds  HGB A1c  4.4 on admit  11. Anxiety - per admit on Valium    Kelly Splinter MD [place] pager [phone]   08/25/2018, 1:57 PM   Recent Labs  Lab 08/23/18 1850  08/24/18 0423 08/25/18 0328  NA 135   < > 136  137  K 4.5   < > 4.9 4.9  CL 99   < > 103 102  CO2 20*  --  20* 21*  GLUCOSE 134*   < > 170* 121*  BUN 30*   < > 33* 48*  CREATININE 4.25*   < > 4.66* 5.86*  CALCIUM 7.8*  --  7.6* 7.6*  ALBUMIN 3.2*  --   --   --   INR  --   --   --  1.22   < > = values in this interval not displayed.   Recent Labs  Lab 08/23/18 1850  AST 50*  ALT 27  ALKPHOS 132*  BILITOT 0.7  PROT 7.6   Recent Labs  Lab 08/23/18 1850  08/24/18 0423 08/25/18 0328  WBC 7.4  --  9.3 10.1  NEUTROABS 4.0  --   --   --   HGB 13.8   < > 12.9 11.8*  HCT 43.5   < > 41.2 38.4  MCV 104.8*  --  105.1* 106.1*  PLT 158  --  150 166   < > = values in this interval not displayed.

## 2018-08-25 NOTE — Consult Note (Signed)
Consultation Note Date: 08/25/2018   Patient Name: [name] James  DOB: 1936-10-14  MRN: [id number]  Age / Sex: 82 y.o., female  PCP: [facility] [place] Referring Physician: Samuella Cota, MD  Reason for Consultation: Establishing goals of care  HPI/Patient Profile: 82 y.o. female  with past medical history of hypertension, hyperlipidemia, diabetes mellitus, GERD, gout, anxiety, ESRD on HD ~4 yrs now (MWF), atrial fibrillation not on anticoagulants, breast cancer (bilateral mastectomy 1982), dCHF (wth chronic left pleural effusion), PAD, obesity, steal syndrome in the right upper extremity, who presents with generalized weakness, bradycardia, hypotension admitted on 08/23/2018 with weakness, bradycardia, and hypotension. Found to have recurrent junctional rhythm with HR 30s and now improved to 50s with BP soft but improved.   Clinical Assessment and Goals of Care: I met today with [name] who is very pleasant but also difficult to keep on task of our conversation. She speak much of her children (3 - eldest son she adopted, 1 son local, 1 daughter in [place]). She speaks much of her daughter and her daughter's opinion is very important to her. She does clearly indicate in our conversation her children's urging of her to continue aggressive therapies. Ms. Chandra herself says that she is very ill and she is a very spiritual person and has no fear of dying or concern over declining health. However, she does clearly state that she will accept all interventions including CPR and resuscitation because she knows this is what her children want her to do and she wants them to have peace that she tried everything and that have no regrets. She is even able to admit that this may not be what she would choose for herself but that as a mother this is what she has to do. We discussed that sometimes we have the opportunity to know when  patients have health conditions we cannot fix and that will lead to their death in a short time and that at these times it may help to reconsider resuscitation efforts.   [name] also talks further about pacemaker and that her children are urging her to consider pacemaker. I explained that pacemaker placement was felt to be a bad idea given lack of access to insert pacemaker and femoral access is very likely to become infected and cause her more issues and sooner. She shares that it was not her understanding that a pacemaker is not an option for her. Her understanding is that she can have her chest opened to have pacemaker placed as an option?? and she wishes to think/pray and discuss these options further with her children. She would benefit from having further discussion with EP team and Dr. Caryl Comes who she is familiar with if possible.   She is clear in her desire to continue dialysis and have any interventions necessary to assist her in continuing dialysis. She struggles to understand that there comes a time when her body physically may not tolerate dialysis and this may no longer become an option (in example or heart rate  and blood pressure issues). She believes that if she stays hydrated that this will be of no concern and struggles to understand.   I plan to speak with [name] tomorrow about involving her children in these conversations. Plans for fistulogram and dialysis.   Primary Decision Maker PATIENT    SUMMARY OF RECOMMENDATIONS   - Accepting of EOL/dying from a spiritual standpoint but struggles with the details and process of what this could look like - Desiring full aggressive care with understanding that this is what her children want for her  Code Status/Advance Care Planning:  Full code   Symptom Management:   Per primary, renal, EP.   Palliative Prophylaxis:   Aspiration, Bowel Regimen, Delirium Protocol, Frequent Pain Assessment, Oral Care and Turn  Reposition  Additional Recommendations (Limitations, Scope, Preferences):  Avoid Hospitalization  Psycho-social/Spiritual:   Desire for further Chaplaincy support:yes  Additional Recommendations: Caregiving  Support/Resources and Grief/Bereavement Support  Prognosis:   Overall prognosis is poor. Not eligible for hospice as long as she intends to continue dialysis.   Discharge Planning: To Be Determined      Primary Diagnoses: Present on Admission: . Bradycardia . Chronic diastolic (congestive) heart failure ([place]) . DM type 2, uncontrolled, with renal complications ([place]) . Hypotension . Paroxysmal atrial fibrillation (HCC) . Gout . Anxiety . Asthma exacerbation . Symptomatic bradycardia   I have reviewed the medical record, interviewed the patient and family, and examined the patient. The following aspects are pertinent.  Past Medical History:  Diagnosis Date  . Anemia, chronic disease   . Anxiety   . Arthritis    knees  . Atrial fibrillation ([place])   . Barrett esophagus   . Benign paroxysmal positional vertigo   . Breast cancer ([place]) 1980s   bil mastectomies, no radiation or chemo  . Cataract   . Chest pain, atypical 12/05/2008   R/Lmv- normal perfusion all regions, noe ECG changes   . CHF (congestive heart failure) ([place]) 05/13/2011   echo - YK>59%; stage 1 diastolic dysfunction; elevated LV filling pressure, MAC  . CKD (chronic kidney disease), stage III ([place])   . Claudication ([place]) 10/30/2005   doppler - normal evaluation, no evidence of aneurysm, diameter reduction, dissection, compression or vascular abnormality)  . Complication of anesthesia   . Dyslipidemia   . Dysrhythmia    Atrial Fibrillation  . Edema   . ESRD (end stage renal disease) on dialysis ([place])    "[facility]; [address]; MWF" (07/06/2018)  . Family history of adverse reaction to anesthesia    son also has nausea   . Fever blister 12/2016  . Gastritis and gastroduodenitis   . GERD  (gastroesophageal reflux disease)   . Gout   . Hemodialysis-associated hypotension   . HLD (hyperlipidemia)   . Hypertension   . IBS (irritable bowel syndrome)   . Insomnia   . Morbid obesity ([place])   . On home oxygen therapy    "2L; 24/7" (07/06/2018)  . PAF (paroxysmal atrial fibrillation) ([place])   . Peripheral neuropathy   . Pneumonia 12/2016  . PONV (postoperative nausea and vomiting)   . Renal cyst   . Renal insufficiency 10/30/2005   doppler - abn resistance consistent w/ parenchymal disease  . Renovascular hypertension   . Type 2 diabetes mellitus with diabetic neuropathy, with long-term current use of insulin ([place])   . Vertigo    Social History   Socioeconomic History  . Marital status: Divorced    Spouse name: Not on file  .  Number of children: Not on file  . Years of education: Not on file  . Highest education level: Not on file  Occupational History  . Not on file  Social Needs  . Financial resource strain: Not on file  . Food insecurity:    Worry: Not on file    Inability: Not on file  . Transportation needs:    Medical: Not on file    Non-medical: Not on file  Tobacco Use  . Smoking status: Never Smoker  . Smokeless tobacco: Never Used  Substance and Sexual Activity  . Alcohol use: No  . Drug use: No  . Sexual activity: Not on file  Lifestyle  . Physical activity:    Days per week: Not on file    Minutes per session: Not on file  . Stress: Not on file  Relationships  . Social connections:    Talks on phone: Not on file    Gets together: Not on file    Attends religious service: Not on file    Active member of club or organization: Not on file    Attends meetings of clubs or organizations: Not on file    Relationship status: Not on file  Other Topics Concern  . Not on file  Social History Narrative   [facility] (10/06/17):   Originally from [facility]. Has always lived in [place]. She currently lives in [place]. No bird exposure. [place]. Previously traveled to [place], [place], & [place]. Previously worked in a nursery and also a Pharmacist, community.    Family History  Problem Relation Age of Onset  . Diabetes Mother   . Heart disease Mother   . Non-Hodgkin's lymphoma Mother   . Heart attack Father   . Stroke Father   . Parkinson's disease Brother   . Lung disease Brother   . Heart disease Brother   . Multiple myeloma Sister    Scheduled Meds: . allopurinol  100 mg Oral Daily  . aspirin  81 mg Oral Daily  . Chlorhexidine Gluconate Cloth  6 each Topical Q0600  . diazepam  5 mg Oral QHS  . dimethicone  1 application Topical BID  . dorzolamide-timolol  1 drop Left Eye BID  . doxercalciferol  1 mcg Intravenous Q M,W,F-HD  . heparin  5,000 Units Subcutaneous Q8H  . hydroxypropyl methylcellulose / hypromellose  1 drop Both Eyes TID  . loratadine  10 mg Oral Daily  . Melatonin  6 mg Oral QHS  . multivitamin  1 tablet Oral QHS  . mupirocin ointment  1 application Nasal BID   Continuous Infusions: . ferric gluconate (FERRLECIT/NULECIT) IV     PRN Meds:.acetaminophen **OR** acetaminophen, atropine, loperamide, ondansetron **OR** ondansetron (ZOFRAN) IV, zolpidem Allergies  Allergen Reactions  . Erythromycin Swelling  . Codeine Palpitations, Rash and Other (See Comments)    GI Upset  . Erythromycin Base Other (See Comments)    GI Upset  . Penicillin G Rash    Has patient had a PCN reaction causing immediate rash, facial/tongue/throat swelling, SOB or lightheadedness with hypotension: Yes Has patient had a PCN reaction causing severe rash involving mucus membranes or skin necrosis: Unk Has patient had a PCN reaction that required hospitalization: Unk Has patient had a PCN reaction occurring within the last 10 years: Unk If all of the above answers are "NO", then may proceed with Cephalosporin use..  . Penicillins Rash and Other (See Comments)    Has taken Keflex &  Rocephin many times without problems Has patient had a PCN  reaction causing immediate rash, facial/tongue/throat swelling, SOB or lightheadedness with hypotension: Yes Has patient had a PCN reaction causing severe rash involving mucus membranes or skin necrosis: Unk Has patient had a PCN reaction that required hospitalization: Unk Has patient had a PCN reaction occurring within the last 10 years: Unk If all of the above answers are "NO", then may proceed with Cephalosporin use.   Keturah Shavers [Ranitidine Hcl] Nausea And Vomiting   Review of Systems  Constitutional: Positive for activity change and appetite change.  Neurological: Positive for weakness.    Physical Exam  Constitutional: She is oriented to person, place, and time. She appears well-developed.  HENT:  Head: Normocephalic and atraumatic.  Cardiovascular: Bradycardia present.  Pulmonary/Chest: Effort normal. No accessory muscle usage. No tachypnea. No respiratory distress.  Abdominal: Normal appearance.  Neurological: She is alert and oriented to person, place, and time.  Nursing note and vitals reviewed.   Vital Signs: BP (!) 151/32   Pulse (!) 35   Temp 97.7 F (36.5 C) (Oral)   Resp 18   SpO2 100%  Pain Scale: 0-10   Pain Score: 0-No pain   SpO2: SpO2: 100 % O2 Device:SpO2: 100 % O2 Flow Rate: .O2 Flow Rate (L/min): 2 L/min(home O2)  IO: Intake/output summary:   Intake/Output Summary (Last 24 hours) at 08/25/2018 0948 Last data filed at 08/25/2018 0900 Gross per 24 hour  Intake 176 ml  Output 1 ml  Net 175 ml    LBM: Last BM Date: 08/23/18 Baseline Weight:   Most recent weight:       Palliative Assessment/Data: 40%     Time In: 0930 Time Out: 1130 Time Total: 120 min Greater than 50%  of this time was spent counseling and coordinating care related to the above assessment and plan.  Signed by: Vinie Sill, NP Palliative Medicine Team Pager # [phone] (M-F 8a-5p) Team Phone # [phone] (Nights/Weekends)

## 2018-08-25 NOTE — Progress Notes (Signed)
  PROGRESS NOTE  [name] James VVO:[id number] DOB: 07/24/1936 DOA: 08/23/2018 PCP: [facility]  Brief Narrative: 82 year old woman PMH diabetes mellitus, atrial fibrillation, end-stage renal disease presented with generalized weakness, bradycardia, hypotension.  Admitted for hypotension, bradycardia.  Some unresponsive to fluids and atropine.  Assessment/Plan Junctional bradycardia, hypotension, not on any rate control agents. --Asymptomatic.  Rate currently in the 60s. -- Await further recommendations from electrophysiology.  Son and patient would like to discuss options again with electrophysiology.   Paroxysmal atrial fibrillation not on anticoagulation --Appears stable.  No rate control agents as above.  End-stage renal disease.  Status post fistulogram with angioplasty today per interventional radiology.  Vascular surgery plans ligation of graft in the left arm later this week. --Management per nephrology.  Chronic hypoxic resp failure  --continue home oxygen   Diabetes mellitus type 2, diet-controlled --Stable.  DVT prophylaxis: heparin Code Status: Full Family Communication: son Disposition Plan: pending    Murray Hodgkins, MD  [facility] Direct contact: [phone] --Via amion app OR  --[email or web address]; password [email or web address]  7PM-7AM contact night coverage as above 08/25/2018, 7:15 PM  LOS: 1 day   Consultants:  Nephrology  VVS  EP  PMT  Procedures:  Technically successful fistulogram with angioplasty.    Antimicrobials:    Interval history/Subjective: Feels fine  Objective: Vitals:  Vitals:   08/25/18 0110 08/25/18 0815  BP: (!) 151/32   Pulse: (!) 35   Resp: 18   Temp:  97.7 F (36.5 C)  SpO2: 100%     Exam:  Constitutional:  . Appears calm and comfortable Respiratory:  . CTA bilaterally, no w/r/r.  . Respiratory effort normal.  Cardiovascular:  . RRR, no m/r/g . No LE extremity edema   . HR in 60s on telemetry Psychiatric:   . Mental status o Mood, affect appropriate  I have personally reviewed the following:   Labs:  BMP consistent with end-stage renal disease.  Troponins negative  CBC unremarkable  Imaging studies:  CXR noted  Medical tests:  EKG junctional bradycardia   Scheduled Meds: . allopurinol  100 mg Oral Daily  . aspirin  81 mg Oral Daily  . Chlorhexidine Gluconate Cloth  6 each Topical Q0600  . diazepam  5 mg Oral QHS  . dimethicone  1 application Topical BID  . dorzolamide-timolol  1 drop Left Eye BID  . doxercalciferol  1 mcg Intravenous Q M,W,F-HD  . heparin  5,000 Units Subcutaneous Q8H  . hydroxypropyl methylcellulose / hypromellose  1 drop Both Eyes TID  . lidocaine      . loratadine  10 mg Oral Daily  . Melatonin  6 mg Oral QHS  . multivitamin  1 tablet Oral QHS  . mupirocin ointment  1 application Nasal BID   Continuous Infusions: . ferric gluconate (FERRLECIT/NULECIT) IV      Principal Problem:   Junctional bradycardia Active Problems:   DM type 2, uncontrolled, with renal complications (HCC)   ESRD on dialysis (HCC)   Chronic diastolic (congestive) heart failure (HCC)   Paroxysmal atrial fibrillation (HCC)   Chronic respiratory failure with hypoxia (HCC)   Gout   Anxiety   Symptomatic bradycardia   SA node dysfunction (HCC)   LOS: 1 day     Time spent in discussion with patient, review of current electrophysiology recommendations, discussion with son at bedside, examination, chart review 50 minutes.

## 2018-08-25 NOTE — Progress Notes (Signed)
HD tx completed @ 2255 w/o problem until last 17 min of tx when she c/o nausea and wanted UF off, UF goal still met, blood rinsed back, VSS, report called to Tad Moore, RN

## 2018-08-25 NOTE — Procedures (Signed)
Pre Procedure Dx: ESRD Post Procedure Dx: Same  Technically successful fistulogram with angioplasty.    EBL: Minimal  No immediate complications.  Jay Yoali Conry, MD Pager #: [phone]  

## 2018-08-25 NOTE — Progress Notes (Signed)
HD tx initiated via 16Gx2 w/o problem, pull/push/flush well w/o problem, VSS, will cont to monitor while on HD tx

## 2018-08-25 NOTE — Progress Notes (Signed)
   VASCULAR SURGERY ASSESSMENT & PLAN:   POORLY FUNCTIONING RIGHT FOREARM AV GRAFT: Patient is scheduled for a fistulogram by IR today.  She has very sluggish flow in her graft.  Hopefully she has something amenable to venoplasty to improve the function of her graft.  However, I am concerned that the poor function may be related to a very low cardiac output.  She was seen by cardiology yesterday for bradycardia.  She is noted to have a junctional bradycardia and has had some chronic hypotension.   If her graft cannot be salvaged then she will require placement of a tunneled dialysis catheter.  This could potentially be done by IR at the same setting.  For now I will leave her on the schedule for ligation of her left arm graft because of chronic swelling in the left arm.   SUBJECTIVE:   No complaints this morning.  PHYSICAL EXAM:   Vitals:   08/24/18 1440 08/24/18 1940 08/24/18 2110 08/25/18 0110  BP:  (!) 163/54 (!) 135/55 (!) 151/32  Pulse:  (!) 43 (!) 43 (!) 35  Resp:  16 19 18   Temp: 97.9 F (36.6 C)     TempSrc: Oral     SpO2:  100% 100% 100%   Weak bruit in both her right forearm graft and left upper arm graft.  LABS:   Lab Results  Component Value Date   WBC 10.1 08/25/2018   HGB 11.8 (L) 08/25/2018   HCT 38.4 08/25/2018   MCV 106.1 (H) 08/25/2018   PLT 166 08/25/2018   Lab Results  Component Value Date   CREATININE 5.86 (H) 08/25/2018   Lab Results  Component Value Date   INR 1.22 08/25/2018   CBG (last 3)  Recent Labs    08/24/18 0023 08/24/18 0819  GLUCAP 163* 131*    PROBLEM LIST:    Principal Problem:   Hypotension Active Problems:   DM type 2, uncontrolled, with renal complications (HCC)   ESRD on dialysis (HCC)   Chronic diastolic (congestive) heart failure (HCC)   Paroxysmal atrial fibrillation (HCC)   Bradycardia   Gout   Anxiety   Asthma exacerbation   Symptomatic bradycardia   CURRENT MEDS:   . allopurinol  100 mg Oral Daily  .  aspirin  81 mg Oral Daily  . Chlorhexidine Gluconate Cloth  6 each Topical Q0600  . diazepam  5 mg Oral QHS  . dimethicone  1 application Topical BID  . dorzolamide-timolol  1 drop Left Eye BID  . doxercalciferol  1 mcg Intravenous Q M,W,F-HD  . heparin  5,000 Units Subcutaneous Q8H  . hydroxypropyl methylcellulose / hypromellose  1 drop Both Eyes TID  . loratadine  10 mg Oral Daily  . Melatonin  6 mg Oral QHS  . multivitamin  1 tablet Oral QHS  . mupirocin ointment  1 application Nasal BID    [name] Beeper: [phone] Office: [phone] 08/25/2018

## 2018-08-25 NOTE — Progress Notes (Signed)
Pt's daughter Lavella Lemons 336-called and is requesting a call be placed to pt's outpatient cardiologist (Dr. Sanda Klein  [phone]) to notify him of this visit along with assessments and rec's regarding pacemaker placement.  [id number]

## 2018-08-25 NOTE — NC FL2 (Signed)
[place] MEDICAID FL2 LEVEL OF CARE SCREENING TOOL     IDENTIFICATION  Patient Name: [name] James Birthdate: 1936-04-12 Sex: female Admission Date (Current Location): 08/23/2018  [facility] and [place] Number:  Herbalist and Address:  [facility] [place], [place] [address], [place], [place] [address]      Provider Number: [id number]  Attending Physician Name and Address:  Samuella Cota, MD  Relative Name and Phone Number:  Kreg Shropshire (Daughter) [phone]    Current Level of Care: Hospital Recommended Level of Care: [place] Prior Approval Number:    Date Approved/Denied:   PASRR Number: [id number]  Discharge Plan: SNF    Current Diagnoses: Patient Active Problem List   Diagnosis Date Noted  . Symptomatic bradycardia 08/24/2018  . Gout 08/23/2018  . Anxiety 08/23/2018  . Asthma exacerbation 08/23/2018  . Chest pain 07/06/2018  . Bradycardia 07/06/2018  . Pneumothorax, left 10/13/2017  . Multiple lung nodules on CT 10/06/2017  . Pleural effusion, left 10/06/2017  . Diabetic polyneuropathy associated with type 2 diabetes mellitus ([place]) 07/30/2017  . Onychomycosis of toenail 07/30/2017  . Idiopathic chronic venous hypertension of both lower extremities with inflammation 07/30/2017  . Chronic respiratory failure with hypoxia ([place]) 04/29/2017  . Morbid (severe) obesity due to excess calories ([place]) 04/29/2017  . LFT elevation   . Advance care planning   . Goals of care, counseling/discussion   . Palliative care by specialist   . Pressure ulcer 01/19/2017  . Long term current use of amiodarone 10/17/2016  . History of pressure ulcer 10/17/2016  . GAD (generalized anxiety disorder) 10/17/2016  . Osteopenia determined by x-ray 04/17/2016  . Controlled type 2 diabetes mellitus with diabetic polyneuropathy, with long-term current use of insulin ([place]) 04/17/2016  . Type 2 diabetes mellitus with diabetic neuropathy ([place])  06/08/2015  . Type 2 diabetes, controlled, with renal manifestation ([place]) 06/08/2015  . PVD (peripheral vascular disease) ([place]) 06/08/2015  . Swelling of limb-Left arm 04/10/2015  . Nausea with vomiting 01/12/2015  . AP (abdominal pain) 01/12/2015  . Normocytic anemia 01/12/2015  . Paroxysmal atrial fibrillation ([place]) 12/22/2014  . Melena   . ESRD on dialysis ([place]) 12/19/2014  . Chronic diastolic (congestive) heart failure ([place]) 12/19/2014  . Palpitation 12/19/2014  . Anemia, chronic disease 12/13/2014  . Hypotension 12/13/2014  . Pulmonary edema 11/10/2014  . Physical deconditioning 10/29/2014  . Iron deficiency anemia   . Gastritis and gastroduodenitis 10/24/2014  . Heme positive stool 10/24/2014  . Morbid obesity ([place]) 10/18/2014  . Edema 10/18/2014  . Essential hypertension 10/17/2014  . DM type 2, uncontrolled, with renal complications ([place]) 12/24/7251  . Dyslipidemia 10/17/2014  . Anemia 10/17/2014    Orientation RESPIRATION BLADDER Height & Weight     Self, Time, Situation, Place  O2(Nasal Cannula 2L/min) Continent Weight:   Height:     BEHAVIORAL SYMPTOMS/MOOD NEUROLOGICAL BOWEL NUTRITION STATUS      Continent Diet  AMBULATORY STATUS COMMUNICATION OF NEEDS Skin   Extensive Assist Verbally Skin abrasions(Skin abrasions moisture associated damage left arm, buttocks and groin. Gauze used.)                       Personal Care Assistance Level of Assistance  Bathing, Feeding, Dressing, Total care Bathing Assistance: Maximum assistance Feeding assistance: Maximum assistance Dressing Assistance: Maximum assistance Total Care Assistance: Maximum assistance   Functional Limitations Info  Sight, Hearing, Speech Sight Info: Adequate Hearing Info: Adequate Speech Info:  Adequate    SPECIAL CARE FACTORS FREQUENCY  PT (By licensed PT), OT (By licensed OT)     PT Frequency: 2x weekly OT Frequency: 2x weekly            Contractures Contractures Info: Not  present    Additional Factors Info  Code Status, Allergies, Isolation Precautions Code Status Info: Full Code Allergies Info: Allergies:  Erythromycin, Codeine, Erythromycin Base, Penicillin G, Penicillins, Zantac Ranitidine Hcl     Isolation Precautions Info: MRSA by PCR     Current Medications (08/25/2018):  This is the current hospital active medication list Current Facility-Administered Medications  Medication Dose Route Frequency Provider Last Rate Last Dose  . acetaminophen (TYLENOL) tablet 650 mg  650 mg Oral Q6H PRN Ivor Costa, MD       Or  . acetaminophen (TYLENOL) suppository 650 mg  650 mg Rectal Q6H PRN Ivor Costa, MD      . allopurinol (ZYLOPRIM) tablet 100 mg  100 mg Oral Daily Ivor Costa, MD   100 mg at 08/24/18 1100  . aspirin chewable tablet 81 mg  81 mg Oral Daily Ivor Costa, MD   81 mg at 08/24/18 1000  . atropine 1 MG/10ML injection 0.4 mg  0.4 mg Intravenous PRN Ivor Costa, MD      . Chlorhexidine Gluconate Cloth 2 % PADS 6 each  6 each Topical Q0600 Ivor Costa, MD      . diazepam (VALIUM) tablet 5 mg  5 mg Oral QHS Ivor Costa, MD   5 mg at 08/24/18 2100  . dimethicone 1 % cream 1 application  1 application Topical BID Ivor Costa, MD   1 application at 41/32/44 2100  . dorzolamide-timolol (COSOPT) 22.3-6.8 MG/ML ophthalmic solution 1 drop  1 drop Left Eye BID Ivor Costa, MD   1 drop at 08/24/18 2100  . doxercalciferol (HECTOROL) injection 1 mcg  1 mcg Intravenous Q M,W,F-HD Zeyfang, David, PA-C      . ferric gluconate (NULECIT) 62.5 mg in sodium chloride 0.9 % 100 mL IVPB  62.5 mg Intravenous Q Wed-HD Ernest Haber, PA-C      . heparin injection 5,000 Units  5,000 Units Subcutaneous Q8H Ivor Costa, MD   5,000 Units at 08/25/18 0102  . hydroxypropyl methylcellulose / hypromellose (ISOPTO TEARS / GONIOVISC) 2.5 % ophthalmic solution 1 drop  1 drop Both Eyes TID Ivor Costa, MD   1 drop at 08/24/18 2100  . loperamide (IMODIUM) capsule 2 mg  2 mg Oral TID PRN Ivor Costa, MD       . loratadine (CLARITIN) tablet 10 mg  10 mg Oral Daily Ivor Costa, MD   10 mg at 08/24/18 1030  . Melatonin TABS 6 mg  6 mg Oral QHS Ivor Costa, MD   6 mg at 08/24/18 2100  . multivitamin (RENA-VIT) tablet 1 tablet  1 tablet Oral QHS Ivor Costa, MD   1 tablet at 08/24/18 2100  . mupirocin ointment (BACTROBAN) 2 % 1 application  1 application Nasal BID Ivor Costa, MD   1 application at 72/53/66 2100  . ondansetron (ZOFRAN) tablet 4 mg  4 mg Oral Q6H PRN Ivor Costa, MD       Or  . ondansetron [facility]) injection 4 mg  4 mg Intravenous Q6H PRN Ivor Costa, MD   4 mg at 08/24/18 1649  . zolpidem (AMBIEN) tablet 5 mg  5 mg Oral QHS PRN Ivor Costa, MD         Discharge Medications: Please  see discharge summary for a list of discharge medications.  Relevant Imaging Results:  Relevant Lab Results:   Additional Information SSN: [id number]  Alberteen Sam, LCSW

## 2018-08-26 LAB — BASIC METABOLIC PANEL
Anion gap: 11 (ref 5–15)
BUN: 23 mg/dL (ref 8–23)
CO2: 27 mmol/L (ref 22–32)
Calcium: 7.6 mg/dL — ABNORMAL LOW (ref 8.9–10.3)
Chloride: 100 mmol/L (ref 98–111)
Creatinine, Ser: 3.78 mg/dL — ABNORMAL HIGH (ref 0.44–1.00)
GFR calc Af Amer: 12 mL/min — ABNORMAL LOW (ref >60)
GFR, EST NON AFRICAN AMERICAN: 10 mL/min — AB (ref >60)
GLUCOSE: 89 mg/dL (ref 70–99)
POTASSIUM: 4 mmol/L (ref 3.5–5.1)
Sodium: 138 mmol/L (ref 135–145)

## 2018-08-26 LAB — CBC
HEMATOCRIT: 38.4 % (ref 36.0–46.0)
Hemoglobin: 11.8 g/dL — ABNORMAL LOW (ref 12.0–15.0)
MCH: 32.3 pg (ref 26.0–34.0)
MCHC: 30.7 g/dL (ref 30.0–36.0)
MCV: 105.2 fL — ABNORMAL HIGH (ref 78.0–100.0)
Platelets: 147 10*3/uL — ABNORMAL LOW (ref 150–400)
RBC: 3.65 MIL/uL — ABNORMAL LOW (ref 3.87–5.11)
RDW: 15.5 % (ref 11.5–15.5)
WBC: 7.7 10*3/uL (ref 4.0–10.5)

## 2018-08-26 LAB — GLUCOSE, CAPILLARY
GLUCOSE-CAPILLARY: 157 mg/dL — AB (ref 70–99)
Glucose-Capillary: 81 mg/dL (ref 70–99)

## 2018-08-26 MED ORDER — MIDODRINE HCL 5 MG PO TABS
10.0000 mg | ORAL_TABLET | ORAL | Status: DC
  Administered 2018-08-27: 10 mg via ORAL
  Filled 2018-08-26: qty 2

## 2018-08-26 MED ORDER — ORAL CARE MOUTH RINSE
15.0000 mL | Freq: Two times a day (BID) | OROMUCOSAL | Status: DC
  Administered 2018-08-26 – 2018-08-28 (×4): 15 mL via OROMUCOSAL

## 2018-08-26 MED ORDER — VANCOMYCIN HCL IN DEXTROSE 1-5 GM/200ML-% IV SOLN
1000.0000 mg | INTRAVENOUS | Status: AC
  Administered 2018-08-27: 1000 mg via INTRAVENOUS
  Filled 2018-08-26 (×2): qty 200

## 2018-08-26 MED ORDER — CHLORHEXIDINE GLUCONATE CLOTH 2 % EX PADS
6.0000 | MEDICATED_PAD | Freq: Every day | CUTANEOUS | Status: DC
  Administered 2018-08-26 – 2018-08-28 (×3): 6 via TOPICAL

## 2018-08-26 NOTE — Progress Notes (Signed)
   VASCULAR SURGERY ASSESSMENT & PLAN:   The patient underwent successful venoplasty of an outflow stenosis in her left arm graft which now is working better.  I plan on proceeding with ligation of her left upper arm AV fistula where she has a central venous occlusion because of her chronic left arm swelling.  I hope to do this tomorrow morning so that she can potentially be dialyzed after this.  I have discussed this with the hemodialysis unit.  They will not dialyze her in the morning.  I have written her preop orders.  SUBJECTIVE:   No complaints this morning.  PHYSICAL EXAM:   Vitals:   08/25/18 2322 08/26/18 0012 08/26/18 0410 08/26/18 0543  BP: (!) 128/41 (!) 136/51 (!) 146/64   Pulse: 65 70 66   Resp: 15 20 16    Temp: 97.9 F (36.6 C) (!) 97.5 F (36.4 C)  98 F (36.7 C)  TempSrc: Oral Oral  Oral  SpO2: 100% 100% 100%    Much improved thrill in right arm graft. Left upper arm fistula also has a thrill.  LABS:   Lab Results  Component Value Date   WBC 7.7 08/26/2018   HGB 11.8 (L) 08/26/2018   HCT 38.4 08/26/2018   MCV 105.2 (H) 08/26/2018   PLT 147 (L) 08/26/2018   Lab Results  Component Value Date   CREATININE 3.78 (H) 08/26/2018   Lab Results  Component Value Date   INR 1.22 08/25/2018   CBG (last 3)  Recent Labs    08/25/18 0814 08/26/18 0026 08/26/18 0805  GLUCAP 126* 157* 81    PROBLEM LIST:    Principal Problem:   Junctional bradycardia Active Problems:   DM type 2, uncontrolled, with renal complications (HCC)   ESRD on dialysis (HCC)   Chronic diastolic (congestive) heart failure (HCC)   Paroxysmal atrial fibrillation (HCC)   Chronic respiratory failure with hypoxia (HCC)   Gout   Anxiety   Symptomatic bradycardia   SA node dysfunction (HCC)   CURRENT MEDS:   . allopurinol  100 mg Oral Daily  . aspirin  81 mg Oral Daily  . Chlorhexidine Gluconate Cloth  6 each Topical Q0600  . diazepam  5 mg Oral QHS  . dimethicone  1  application Topical BID  . dorzolamide-timolol  1 drop Left Eye BID  . doxercalciferol      . doxercalciferol  1 mcg Intravenous Q M,W,F-HD  . heparin  5,000 Units Subcutaneous Q8H  . hydroxypropyl methylcellulose / hypromellose  1 drop Both Eyes TID  . loratadine  10 mg Oral Daily  . mouth rinse  15 mL Mouth Rinse BID  . Melatonin  6 mg Oral QHS  . multivitamin  1 tablet Oral QHS  . mupirocin ointment  1 application Nasal BID    [name] Beeper: [phone] Office: [phone] 08/26/2018

## 2018-08-26 NOTE — Progress Notes (Signed)
  PROGRESS NOTE  [name] James KCL:[id number] DOB: 1936-02-16 DOA: 08/23/2018 PCP: Patrick Jupiter  Brief Narrative: 82 year old woman PMH diabetes mellitus, atrial fibrillation, end-stage renal disease presented with generalized weakness, bradycardia, hypotension.  Admitted for hypotension, bradycardia.  Some unresponsive to fluids and atropine.  Assessment/Plan Junctional bradycardia, hypotension, not on any rate control agents. --Remains asymptomatic.  Telemetry shows sinus rhythm.   --Await further clarification of options available from electrophysiology.  Patient and family members would like to discuss.  I requested electrophysiology reconsultation this a.m.  Paroxysmal atrial fibrillation not on anticoagulation --Remains in sinus rhythm.  No rate control agents.  End-stage renal disease.  Status post fistulogram with angioplasty today per interventional radiology.  Vascular surgery plans ligation of graft in the left arm tomorrow. --Continue management per nephrology.  Chronic hypoxic resp failure  --Continue home oxygen   Diabetes mellitus type 2, diet-controlled --Remains stable.  DVT prophylaxis: heparin Code Status: Full Family Communication: None Disposition Plan: pending    Murray Hodgkins, MD  [facility] Direct contact: [phone] --Via amion app OR  --[email or web address]; password [email or web address]  7PM-7AM contact night coverage as above 08/26/2018, 4:10 PM  LOS: 2 days   Consultants:  Nephrology  VVS  EP  PMT  Procedures:  Technically successful fistulogram with angioplasty.    Antimicrobials:    Interval history/Subjective: FEELS FINE, No complaints.  Objective: Vitals:  Vitals:   08/26/18 0543 08/26/18 1200  BP:    Pulse:    Resp:    Temp: 98 F (36.7 C) 97.9 F (36.6 C)  SpO2:      Exam: Constitutional:   . Appears calm and comfortable Respiratory:  . CTA bilaterally, no w/r/r.  . Respiratory effort normal.  Cardiovascular:  . RRR,  no m/r/g . No LE extremity edema   . Telemetry SR Psychiatric:  . Mental status o Mood, affect appropriate  I have personally reviewed the following:   Labs:  BMP unremarkable  CBC unremarkable   Scheduled Meds: . allopurinol  100 mg Oral Daily  . aspirin  81 mg Oral Daily  . Chlorhexidine Gluconate Cloth  6 each Topical Q0600  . Chlorhexidine Gluconate Cloth  6 each Topical Q0600  . diazepam  5 mg Oral QHS  . dimethicone  1 application Topical BID  . dorzolamide-timolol  1 drop Left Eye BID  . doxercalciferol  1 mcg Intravenous Q M,W,F-HD  . heparin  5,000 Units Subcutaneous Q8H  . hydroxypropyl methylcellulose / hypromellose  1 drop Both Eyes TID  . loratadine  10 mg Oral Daily  . mouth rinse  15 mL Mouth Rinse BID  . Melatonin  6 mg Oral QHS  . [START ON 08/27/2018] midodrine  10 mg Oral Q M,W,F-HD  . multivitamin  1 tablet Oral QHS  . mupirocin ointment  1 application Nasal BID   Continuous Infusions: . ferric gluconate (FERRLECIT/NULECIT) IV 62.5 mg (08/25/18 2143)  . [START ON 08/27/2018] vancomycin      Principal Problem:   Junctional bradycardia Active Problems:   DM type 2, uncontrolled, with renal complications (HCC)   ESRD on dialysis (HCC)   Chronic diastolic (congestive) heart failure (HCC)   Paroxysmal atrial fibrillation (HCC)   Chronic respiratory failure with hypoxia (HCC)   Gout   Anxiety   Symptomatic bradycardia   SA node dysfunction (HCC)   LOS: 2 days

## 2018-08-26 NOTE — Progress Notes (Signed)
Progress Note  Patient Name: [name] James Date of Encounter: 08/26/2018  Primary Cardiologist: Sanda Klein, MD   Subjective   Denies chest pain or sob.   Inpatient Medications    Scheduled Meds: . allopurinol  100 mg Oral Daily  . aspirin  81 mg Oral Daily  . Chlorhexidine Gluconate Cloth  6 each Topical Q0600  . diazepam  5 mg Oral QHS  . dimethicone  1 application Topical BID  . dorzolamide-timolol  1 drop Left Eye BID  . doxercalciferol  1 mcg Intravenous Q M,W,F-HD  . heparin  5,000 Units Subcutaneous Q8H  . hydroxypropyl methylcellulose / hypromellose  1 drop Both Eyes TID  . loratadine  10 mg Oral Daily  . mouth rinse  15 mL Mouth Rinse BID  . Melatonin  6 mg Oral QHS  . multivitamin  1 tablet Oral QHS  . mupirocin ointment  1 application Nasal BID   Continuous Infusions: . ferric gluconate (FERRLECIT/NULECIT) IV 62.5 mg (08/25/18 2143)  . [START ON 08/27/2018] vancomycin     PRN Meds: acetaminophen **OR** acetaminophen, atropine, lidocaine (PF), loperamide, ondansetron **OR** ondansetron (ZOFRAN) IV, zolpidem   Vital Signs    Vitals:   08/25/18 2322 08/26/18 0012 08/26/18 0410 08/26/18 0543  BP: (!) 128/41 (!) 136/51 (!) 146/64   Pulse: 65 70 66   Resp: 15 20 16    Temp: 97.9 F (36.6 C) (!) 97.5 F (36.4 C)  98 F (36.7 C)  TempSrc: Oral Oral  Oral  SpO2: 100% 100% 100%     Intake/Output Summary (Last 24 hours) at 08/26/2018 1132 Last data filed at 08/26/2018 0600 Gross per 24 hour  Intake 5 ml  Output 1722 ml  Net -1717 ml   Filed Weights    Telemetry    NSR at 64 today  ECG    No new EKGs - Personally Reviewed  Physical Exam    GEN: No acute distress.   Neck: No JVD Cardiac:  RRR, no murmurs, rubs, or gallops.  Respiratory:  CTA b/l. GI: Soft, nontender, non-distended  MS: No edema; No deformity. Neuro:  Nonfocal  Psych: Normal affect   Labs    Chemistry Recent Labs  Lab 08/23/18 1850  08/24/18 0423 08/25/18 0328  08/26/18 0446  NA 135   < > 136 137 138  K 4.5   < > 4.9 4.9 4.0  CL 99   < > 103 102 100  CO2 20*  --  20* 21* 27  GLUCOSE 134*   < > 170* 121* 89  BUN 30*   < > 33* 48* 23  CREATININE 4.25*   < > 4.66* 5.86* 3.78*  CALCIUM 7.8*  --  7.6* 7.6* 7.6*  PROT 7.6  --   --   --   --   ALBUMIN 3.2*  --   --   --   --   AST 50*  --   --   --   --   ALT 27  --   --   --   --   ALKPHOS 132*  --   --   --   --   BILITOT 0.7  --   --   --   --   GFRNONAA 9*  --  8* 6* 10*  GFRAA 10*  --  9* 7* 12*  ANIONGAP 16*  --  13 14 11    < > = values in this interval not displayed.  Hematology Recent Labs  Lab 08/24/18 0423 08/25/18 0328 08/26/18 0446  WBC 9.3 10.1 7.7  RBC 3.92 3.62* 3.65*  HGB 12.9 11.8* 11.8*  HCT 41.2 38.4 38.4  MCV 105.1* 106.1* 105.2*  MCH 32.9 32.6 32.3  MCHC 31.3 30.7 30.7  RDW 15.9* 15.9* 15.5  PLT 150 166 147*    Cardiac Enzymes Recent Labs  Lab 08/23/18 2316 08/24/18 0423 08/24/18 1034  TROPONINI <0.03 <0.03 <0.03    Recent Labs  Lab 08/23/18 1857  TROPIPOC 0.02     BNPNo results for input(s): BNP, PROBNP in the last 168 hours.   DDimer No results for input(s): DDIMER in the last 168 hours.   Radiology     Cardiac Studies   07/06/18 Study Conclusions - Left ventricle: The cavity size was normal. Wall thickness was increased in a pattern of mild LVH. Systolic function was vigorous. The estimated ejection fraction was in the range of 65% to 70%. Wall motion was normal; there were no regional wall motion abnormalities. The study is not technically sufficient to allow evaluation of LV diastolic function. - Aortic valve: There was trivial regurgitation. - Mitral valve: Calcified annulus. - Left atrium: The atrium was severely dilated. - Pulmonary arteries: Systolic pressure was mildly increased. PA peak pressure: 44 mm Hg (S). Impressions: - Vigorous LV systolic function; mild LVH; sclerotic aortic valve with trace AI;  mildly elevated LVOT velocity of 2.3 m/s likely related to vigorous LV systolic function; severe LAE; mild TR with mild pulmonary hypertension.  Myoview 12/20/14: 1. No reversible ischemia. Small mid inferolateral wall infarct. 2. Normal left ventricular wall motion. 3. Left ventricular ejection fraction 68% 4. Low-risk stress test findings  Patient Profile     82 y.o. female with a hx of ESRF on HD for almost 4 years, PAFib not on a/c 2/2 GIB, DM, hx of breast c/a w/hx of b/l mastectomies, she has chronic hypoxic resp failureon O2, reported to have a chronic L pleural effusion, known coronary/aortic atherosclerosis, no caths, negative myoview in 2015, she is a resident at [facility] and described as wheelchair/bed bound admitted with hypotension and bradycardia  She was admitted with reports of generalized weakness of late, had dialysis the day of her admission associated with a near syncopal event, found hypotensive (reportedly SBP 60's-70's and bradycardic (reportedly HR 30's), EMS was called, treated with atropine and IVF  Assessment & Plan    1. Junctional bradycardia     Off her lopressor and amiodarone since her last hospital stay in July     On cosopt though doubt timolol contributes      2. Hypotension     BP looks to be much improved since her admission despite ongoing junctional bradycardia much of the time  The patient was seen initially in consultation this admission 08/24/18, Dr. [name] who noted: "I have discussed the case with Dr. [name], [name], and [name]. She has symptomatic sinus node dysfunction and a clear cut indication for a PPM. Unfortunately her access is such the left left subclavian was previously used for HD but is now occluded based on the collaterals on her skin, a right arm that is being used for HD which is functioning poorly and will undergo revision with interventional radiology in the next few days. Placement of a PPM in her right femoral vein  would be a consideration but examination of the area is particularly discouraging due to her massive obesity and multiple folds and evidence of chronic fungal infection. I strongly  suspect that any insertion of a PPM from the right femoral vein would lead to an cardiac device infection with in a few months. She is quite sedentary and does not walk or transfer and lays in the bed much of the day. Her exam is accurately documented above. Other than an Epicardial PPM, we do not have an option for improving her HR other than possibly adding theophylline or encouraging her to drink more caffeine. She is approaching hospice level care"  EP is being asked to revisit at the request of the patient and family (son) to discuss further.  Of note: She follows with vascular closely, they are following while here noting she had a poorly functioning graft in the left arm with chronic swelling in the left arm related to a central venous occlusion which is been addressed multiple times.the plan was to proceed with left UE graft ligation since she had a working right forearm loop graft. It was noted that prior to her admission at HD for the past 2 sessions she has decreased flow in the right forearm graft. Yesterday underwent successful fistulogram with angioplasty with plans on proceeding with ligation of her left upper arm AV fistula tomorrow     [facility] will sign off.   Medication Recommendations:  Avoid AV nodal blocking drugs Other recommendations (labs, testing, etc):  none Follow up as an outpatient:  Dr. Sallyanne Kuster  For questions or updates, please contact [facility] Please consult [email or web address] for contact info under Cardiology/STEMI.   Signed, Baldwin Jamaica, PA-C  08/26/2018, 11:32 AM    EP Attending  Patient seen and examined. Since I saw her a couple of days ago, her sinus node function has returned and she is in NSR in the 60's. I have carefully reviewed the situation with the  patient.   She has no endovascular access sites. We cannot place an endovascular pacemaker except for a Micra device which is placed via right femoral vein into the RV apex. The placement of a single chamber PM in the RV in a patient with sinus rhythm will result in pacemaker syndrome, will make her feel worse and also make her device dependent. Placement of a standard PM via the right femoral vein will result in a device infection. The area is dirty.   Her only option for physiologic pacing would be to call CVTS and place and epicardial pacing system with an open chest procedure either with median sternotomy or thoracotomy. I feel strongly that she is not a candidate for this procedure as well.   I would consider stopping the timolol eye drops.   In summary there are no good options for pacing this patient. As I noted, medical options that might increase her HR are theophylline. The therapeutic window for these drugs is very small however.   Mikle Bosworth.D.

## 2018-08-26 NOTE — H&P (View-Only) (Signed)
   VASCULAR SURGERY ASSESSMENT & PLAN:   The patient underwent successful venoplasty of an outflow stenosis in her left arm graft which now is working better.  I plan on proceeding with ligation of her left upper arm AV fistula where she has a central venous occlusion because of her chronic left arm swelling.  I hope to do this tomorrow morning so that she can potentially be dialyzed after this.  I have discussed this with the hemodialysis unit.  They will not dialyze her in the morning.  I have written her preop orders.  SUBJECTIVE:   No complaints this morning.  PHYSICAL EXAM:   Vitals:   08/25/18 2322 08/26/18 0012 08/26/18 0410 08/26/18 0543  BP: (!) 128/41 (!) 136/51 (!) 146/64   Pulse: 65 70 66   Resp: 15 20 16    Temp: 97.9 F (36.6 C) (!) 97.5 F (36.4 C)  98 F (36.7 C)  TempSrc: Oral Oral  Oral  SpO2: 100% 100% 100%    Much improved thrill in right arm graft. Left upper arm fistula also has a thrill.  LABS:   Lab Results  Component Value Date   WBC 7.7 08/26/2018   HGB 11.8 (L) 08/26/2018   HCT 38.4 08/26/2018   MCV 105.2 (H) 08/26/2018   PLT 147 (L) 08/26/2018   Lab Results  Component Value Date   CREATININE 3.78 (H) 08/26/2018   Lab Results  Component Value Date   INR 1.22 08/25/2018   CBG (last 3)  Recent Labs    08/25/18 0814 08/26/18 0026 08/26/18 0805  GLUCAP 126* 157* 81    PROBLEM LIST:    Principal Problem:   Junctional bradycardia Active Problems:   DM type 2, uncontrolled, with renal complications (HCC)   ESRD on dialysis (HCC)   Chronic diastolic (congestive) heart failure (HCC)   Paroxysmal atrial fibrillation (HCC)   Chronic respiratory failure with hypoxia (HCC)   Gout   Anxiety   Symptomatic bradycardia   SA node dysfunction (HCC)   CURRENT MEDS:   . allopurinol  100 mg Oral Daily  . aspirin  81 mg Oral Daily  . Chlorhexidine Gluconate Cloth  6 each Topical Q0600  . diazepam  5 mg Oral QHS  . dimethicone  1  application Topical BID  . dorzolamide-timolol  1 drop Left Eye BID  . doxercalciferol      . doxercalciferol  1 mcg Intravenous Q M,W,F-HD  . heparin  5,000 Units Subcutaneous Q8H  . hydroxypropyl methylcellulose / hypromellose  1 drop Both Eyes TID  . loratadine  10 mg Oral Daily  . mouth rinse  15 mL Mouth Rinse BID  . Melatonin  6 mg Oral QHS  . multivitamin  1 tablet Oral QHS  . mupirocin ointment  1 application Nasal BID    [name] James Beeper: [phone] Office: [phone] 08/26/2018

## 2018-08-26 NOTE — Progress Notes (Signed)
[facility] Progress Note  Subjective: no new c/o. 1.7 L off on HD last night.   Vitals:   08/25/18 2322 08/26/18 0012 08/26/18 0410 08/26/18 0543  BP: (!) 128/41 (!) 136/51 (!) 146/64   Pulse: 65 70 66   Resp: 15 20 16    Temp: 97.9 F (36.6 C) (!) 97.5 F (36.4 C)  98 F (36.7 C)  TempSrc: Oral Oral  Oral  SpO2: 100% 100% 100%     Inpatient medications: . allopurinol  100 mg Oral Daily  . aspirin  81 mg Oral Daily  . Chlorhexidine Gluconate Cloth  6 each Topical Q0600  . diazepam  5 mg Oral QHS  . dimethicone  1 application Topical BID  . dorzolamide-timolol  1 drop Left Eye BID  . doxercalciferol  1 mcg Intravenous Q M,W,F-HD  . heparin  5,000 Units Subcutaneous Q8H  . hydroxypropyl methylcellulose / hypromellose  1 drop Both Eyes TID  . loratadine  10 mg Oral Daily  . mouth rinse  15 mL Mouth Rinse BID  . Melatonin  6 mg Oral QHS  . multivitamin  1 tablet Oral QHS  . mupirocin ointment  1 application Nasal BID   . ferric gluconate (FERRLECIT/NULECIT) IV 62.5 mg (08/25/18 2143)  . [START ON 08/27/2018] vancomycin     acetaminophen **OR** acetaminophen, atropine, lidocaine (PF), loperamide, ondansetron **OR** ondansetron (ZOFRAN) IV, zolpidem  Iron/TIBC/Ferritin/ %Sat    Component Value Date/Time   IRON 59 01/26/2017 1515   TIBC NOT CALCULATED 01/26/2017 1515   FERRITIN 1,362 (H) 02/03/2017 1108   IRONPCTSAT NOT CALCULATED 01/26/2017 1515   IRONPCTSAT 22 09/06/2008 1552    Exam: Alert, frail, obese, no distress  no jvd  Chest cat bilat  Cor irreg irreg   Abd soft obese ntnd no wall edema  Ext trace edema  Neuro bilat LE chronic weakness, Ox 3  RFA AVG +bruit, LUA AVG faint bruit and LUE swelling  [facility]: [facility] M[facility]  3h 73min  69.5kg   3K/2.25 bath  Hep 3300   RFA AVG  - hect 1 ug  - venofer 100 x 5 thru 9/11 then 50/wk  - no mircera currently      Impression/ Plan: 1. Bradycardia, sinus - per Cardiology/ EP 2. ESRD -   HD MWF.  HD  Friday.  3. Hypotension = on Midodirne  10 mg pre hd MWF/ op Hydralazine hold w low BP 4. A. Fib Chronic = per Admit /EP 5. Volume - is close to dry wt, no vol excess one xam, lungs clear 6. HD access - poor flow of R arm AVG sp shuntogram 9/4 w/ angioplasty. Also has LUE AVG w/ chronic central venous occlusion and LUE swelling, is for ligation of this access later this week possibly per VVS.  7. Anemia of ESRD  - HGB > 11  No esa as op , FU trend , on venofer will hold load dose  with hgb >11 and ferritin last 900s'  Use weekly 50 mg q wed hd  8. Metabolic bone disease -  hec on hd ,  No phos binder ( op phos stable ) fu trend  9. Nutrition - usally  On 1200 cc  Fluid restriction and Reg. Diet with low po intake (on 3 k bath as op ) fu lab trend  10. DM type 2 - no meds  HGB A1c  4.4 on admit  11. Anxiety - per admit on Valium    Kelly Splinter MD  [facility] pager [phone]   08/26/2018, 11:36 AM   Recent Labs  Lab 08/23/18 1850  08/25/18 0328 08/26/18 0446  NA 135   < > 137 138  K 4.5   < > 4.9 4.0  CL 99   < > 102 100  CO2 20*   < > 21* 27  GLUCOSE 134*   < > 121* 89  BUN 30*   < > 48* 23  CREATININE 4.25*   < > 5.86* 3.78*  CALCIUM 7.8*   < > 7.6* 7.6*  ALBUMIN 3.2*  --   --   --   INR  --   --  1.22  --    < > = values in this interval not displayed.   Recent Labs  Lab 08/23/18 1850  AST 50*  ALT 27  ALKPHOS 132*  BILITOT 0.7  PROT 7.6   Recent Labs  Lab 08/23/18 1850  08/25/18 0328 08/26/18 0446  WBC 7.4   < > 10.1 7.7  NEUTROABS 4.0  --   --   --   HGB 13.8   < > 11.8* 11.8*  HCT 43.5   < > 38.4 38.4  MCV 104.8*   < > 106.1* 105.2*  PLT 158   < > 166 147*   < > = values in this interval not displayed.

## 2018-08-26 NOTE — Clinical Social Work Note (Signed)
Clinical Social Work Assessment  Patient Details  Name: [name] James MRN: [id number] Date of Birth: 1936-05-02  Date of referral:  08/26/18               Reason for consult:  Facility Placement, Care Management Concerns                Permission sought to share information with:  Facility Sport and exercise psychologist, Family Supports Permission granted to share information::  Yes, Verbal Permission Granted  Name::        Agency::  [facility]  Relationship::     Contact Information:     Housing/Transportation Living arrangements for the past 2 months:  [place] of Information:  Patient Patient Interpreter Needed:  None Criminal Activity/Legal Involvement Pertinent to Current Situation/Hospitalization:  No - Comment as needed Significant Relationships:  Adult Children Lives with:  Facility Resident Do you feel safe going back to the place where you live?  Yes Need for family participation in patient care:  No (Coment)  Care giving concerns:  Spoke w/patient regarding SNF placement. Patient has limited support to assist at home given current needs. CSW received referral for patient to return to SNF placement at [facility] at time of discharge. Patient expressed understanding of PT recommendation and are agreeable to return to SNF placement at time of discharge. CSW to continue to follow and assist with discharge planning needs.  Social Worker assessment / plan:   Spoke with patient regarding return to SNF at [place].   Employment status:  Retired Forensic scientist:  Medicare PT Recommendations:  [place] / Referral to community resources:  [place]  Patient/Family's Response to care:  Patient recognized need for rehab and was agreeable to returning to SNF at [place]. CSW explained insurance authorization process.   Patient/Family's Understanding of and Emotional Response to Diagnosis, Current Treatment, and Prognosis:   Patient is realistic regarding therapy needs. Patient expressed understanding of CSW role and discharge process as well as medical condition. No questions/concerns about plan or treatment.  Emotional Assessment Appearance:  Appears stated age Attitude/Demeanor/Rapport:  Gracious Affect (typically observed):  Accepting, Appropriate Orientation:  Oriented to Self, Oriented to Place, Oriented to  Time, Oriented to Situation Alcohol / Substance use:  Not Applicable Psych involvement (Current and /or in the community):  No (Comment)  Discharge Needs  Concerns to be addressed:  Care Coordination Readmission within the last 30 days:  No Current discharge risk:  None Barriers to Discharge:  Continued Medical Work up   [employer], [place] 08/26/2018, 11:14 AM

## 2018-08-26 NOTE — Progress Notes (Signed)
Assumed care of Pt at this time. SBAR report received from [place], Therapist, sports.

## 2018-08-27 ENCOUNTER — Ambulatory Visit (HOSPITAL_COMMUNITY): Admit: 2018-08-27 | Payer: Medicare Other | Admitting: Vascular Surgery

## 2018-08-27 ENCOUNTER — Inpatient Hospital Stay (HOSPITAL_COMMUNITY): Payer: Medicare Other | Admitting: Certified Registered"

## 2018-08-27 ENCOUNTER — Encounter (HOSPITAL_COMMUNITY)
Admission: EM | Disposition: A | Discharge status: Skilled Nursing Facility | Payer: Self-pay | Attending: Family Medicine

## 2018-08-27 ENCOUNTER — Encounter (HOSPITAL_COMMUNITY): Payer: Self-pay | Admitting: *Deleted

## 2018-08-27 DIAGNOSIS — N186 End stage renal disease: Secondary | ICD-10-CM

## 2018-08-27 DIAGNOSIS — Z992 Dependence on renal dialysis: Secondary | ICD-10-CM

## 2018-08-27 DIAGNOSIS — R001 Bradycardia, unspecified: Secondary | ICD-10-CM

## 2018-08-27 DIAGNOSIS — T82898A Other specified complication of vascular prosthetic devices, implants and grafts, initial encounter: Secondary | ICD-10-CM

## 2018-08-27 HISTORY — PX: LIGATION ARTERIOVENOUS GORTEX GRAFT: SHX5947

## 2018-08-27 LAB — BASIC METABOLIC PANEL
Anion gap: 16 — ABNORMAL HIGH (ref 5–15)
BUN: 41 mg/dL — AB (ref 8–23)
CALCIUM: 7.2 mg/dL — AB (ref 8.9–10.3)
CHLORIDE: 98 mmol/L (ref 98–111)
CO2: 22 mmol/L (ref 22–32)
Creatinine, Ser: 5.12 mg/dL — ABNORMAL HIGH (ref 0.44–1.00)
GFR calc non Af Amer: 7 mL/min — ABNORMAL LOW (ref >60)
GFR, EST AFRICAN AMERICAN: 8 mL/min — AB (ref >60)
Glucose, Bld: 124 mg/dL — ABNORMAL HIGH (ref 70–99)
Potassium: 4.5 mmol/L (ref 3.5–5.1)
SODIUM: 136 mmol/L (ref 135–145)

## 2018-08-27 LAB — GLUCOSE, CAPILLARY
GLUCOSE-CAPILLARY: 117 mg/dL — AB (ref 70–99)
GLUCOSE-CAPILLARY: 93 mg/dL (ref 70–99)
Glucose-Capillary: 129 mg/dL — ABNORMAL HIGH (ref 70–99)

## 2018-08-27 LAB — CBC
HCT: 36.4 % (ref 36.0–46.0)
Hemoglobin: 11.3 g/dL — ABNORMAL LOW (ref 12.0–15.0)
MCH: 32.8 pg (ref 26.0–34.0)
MCHC: 31 g/dL (ref 30.0–36.0)
MCV: 105.5 fL — AB (ref 78.0–100.0)
PLATELETS: 160 10*3/uL (ref 150–400)
RBC: 3.45 MIL/uL — ABNORMAL LOW (ref 3.87–5.11)
RDW: 15.6 % — AB (ref 11.5–15.5)
WBC: 8.1 10*3/uL (ref 4.0–10.5)

## 2018-08-27 SURGERY — LIGATION ARTERIOVENOUS GORTEX GRAFT
Anesthesia: Monitor Anesthesia Care | Site: Arm Upper | Laterality: Left

## 2018-08-27 MED ORDER — 0.9 % SODIUM CHLORIDE (POUR BTL) OPTIME
TOPICAL | Status: DC | PRN
  Administered 2018-08-27: 1000 mL

## 2018-08-27 MED ORDER — FENTANYL CITRATE (PF) 250 MCG/5ML IJ SOLN
INTRAMUSCULAR | Status: AC
  Filled 2018-08-27: qty 5

## 2018-08-27 MED ORDER — HEPARIN SODIUM (PORCINE) 1000 UNIT/ML DIALYSIS
3300.0000 [IU] | Freq: Once | INTRAMUSCULAR | Status: AC
  Administered 2018-08-27: 3300 [IU] via INTRAVENOUS_CENTRAL

## 2018-08-27 MED ORDER — SODIUM CHLORIDE 0.9 % IV SOLN
INTRAVENOUS | Status: DC
  Administered 2018-08-27: 12:00:00 via INTRAVENOUS

## 2018-08-27 MED ORDER — PROPOFOL 10 MG/ML IV BOLUS
INTRAVENOUS | Status: DC | PRN
  Administered 2018-08-27: 10 mg via INTRAVENOUS

## 2018-08-27 MED ORDER — SODIUM CHLORIDE 0.9 % IV SOLN
INTRAVENOUS | Status: DC | PRN
  Administered 2018-08-27: 40 ug/min via INTRAVENOUS

## 2018-08-27 MED ORDER — ONDANSETRON HCL 4 MG/2ML IJ SOLN: 4.0000 mg | Freq: Once | INTRAMUSCULAR | Status: DC | PRN

## 2018-08-27 MED ORDER — FENTANYL CITRATE (PF) 250 MCG/5ML IJ SOLN
INTRAMUSCULAR | Status: DC | PRN
  Administered 2018-08-27: 50 ug via INTRAVENOUS

## 2018-08-27 MED ORDER — PROPOFOL 10 MG/ML IV BOLUS
INTRAVENOUS | Status: AC
  Filled 2018-08-27: qty 20

## 2018-08-27 MED ORDER — PRO-STAT SUGAR FREE PO LIQD
30.0000 mL | Freq: Two times a day (BID) | ORAL | Status: DC
  Administered 2018-08-27 – 2018-08-28 (×3): 30 mL via ORAL
  Filled 2018-08-27 (×2): qty 30

## 2018-08-27 MED ORDER — FENTANYL CITRATE (PF) 100 MCG/2ML IJ SOLN: 25.0000 ug | INTRAMUSCULAR | Status: DC | PRN

## 2018-08-27 MED ORDER — PROPOFOL 500 MG/50ML IV EMUL
INTRAVENOUS | Status: DC | PRN
  Administered 2018-08-27: 40 ug/kg/min via INTRAVENOUS

## 2018-08-27 MED ORDER — LIDOCAINE-EPINEPHRINE (PF) 1 %-1:200000 IJ SOLN
INTRAMUSCULAR | Status: DC | PRN
  Administered 2018-08-27: 30 mL

## 2018-08-27 SURGICAL SUPPLY — 30 items
ADH SKN CLS APL DERMABOND .7 (GAUZE/BANDAGES/DRESSINGS) ×1
ADH SKN CLS LQ APL DERMABOND (GAUZE/BANDAGES/DRESSINGS) ×1
CANISTER SUCT 3000ML PPV (MISCELLANEOUS) ×3 IMPLANT
DERMABOND ADHESIVE PROPEN (GAUZE/BANDAGES/DRESSINGS) ×2
DERMABOND ADVANCED (GAUZE/BANDAGES/DRESSINGS) ×2
DERMABOND ADVANCED .7 DNX12 (GAUZE/BANDAGES/DRESSINGS) ×1 IMPLANT
DERMABOND ADVANCED .7 DNX6 (GAUZE/BANDAGES/DRESSINGS) IMPLANT
ELECT REM PT RETURN 9FT ADLT (ELECTROSURGICAL) ×3
ELECTRODE REM PT RTRN 9FT ADLT (ELECTROSURGICAL) ×1 IMPLANT
GAUZE SPONGE 4X4 12PLY STRL (GAUZE/BANDAGES/DRESSINGS) ×3 IMPLANT
GLOVE BIO SURGEON STRL SZ7.5 (GLOVE) ×3 IMPLANT
GLOVE BIOGEL PI IND STRL 8 (GLOVE) ×1 IMPLANT
GLOVE BIOGEL PI INDICATOR 8 (GLOVE) ×2
GOWN STRL REUS W/ TWL LRG LVL3 (GOWN DISPOSABLE) ×3 IMPLANT
GOWN STRL REUS W/TWL LRG LVL3 (GOWN DISPOSABLE) ×9
KIT BASIN OR (CUSTOM PROCEDURE TRAY) ×3 IMPLANT
KIT TURNOVER KIT B (KITS) ×3 IMPLANT
NS IRRIG 1000ML POUR BTL (IV SOLUTION) ×3 IMPLANT
PACK CV ACCESS (CUSTOM PROCEDURE TRAY) ×3 IMPLANT
PAD ARMBOARD 7.5X6 YLW CONV (MISCELLANEOUS) ×6 IMPLANT
SPONGE SURGIFOAM ABS GEL 100 (HEMOSTASIS) IMPLANT
SUT ETHILON 3 0 PS 1 (SUTURE) IMPLANT
SUT PROLENE 6 0 BV (SUTURE) IMPLANT
SUT SILK 0 TIES 10X30 (SUTURE) ×3 IMPLANT
SUT VIC AB 3-0 SH 27 (SUTURE) ×3
SUT VIC AB 3-0 SH 27X BRD (SUTURE) ×1 IMPLANT
SUT VICRYL 4-0 PS2 18IN ABS (SUTURE) ×3 IMPLANT
TOWEL GREEN STERILE (TOWEL DISPOSABLE) ×3 IMPLANT
UNDERPAD 30X30 (UNDERPADS AND DIAPERS) ×3 IMPLANT
WATER STERILE IRR 1000ML POUR (IV SOLUTION) ×3 IMPLANT

## 2018-08-27 NOTE — Progress Notes (Signed)
PROGRESS NOTE  [name] James ZYS:[id number] DOB: 1936/08/18 DOA: 08/23/2018 PCP: Patrick Jupiter  Brief Narrative: 82 year old woman PMH diabetes mellitus, atrial fibrillation, end-stage renal disease presented with generalized weakness, bradycardia, hypotension.  Admitted for hypotension, bradycardia.  Some unresponsive to fluids and atropine.  Assessment/Plan Junctional bradycardia, hypotension, not on any rate control agents. --Telemetry shows sinus rhythm. --Electrophysiology has again discussed with patient and recommended against pacemaker placement of any kind.  It is not clear whether this was discussed with family members. --Consider discontinuing timolol eyedrop.  Paroxysmal atrial fibrillation not on anticoagulation --Remains in sinus rhythm.  No rate control agents.  End-stage renal disease.  Status post fistulogram with angioplasty today per interventional radiology.  --Continue management per nephrology. --Vascular surgery planned today with dialysis to follow  Chronic hypoxic resp failure  --Continue home oxygen   Diabetes mellitus type 2, diet-controlled --Remains stable.  Would anticipate transfer to skilled nursing facility 9/7 in the absence of any further recommendations.  DVT prophylaxis: heparin Code Status: Full Family Communication: None Disposition Plan: return to [facility]   Murray Hodgkins, MD  [facility] Direct contact: [phone] --Via amion app OR  --[email or web address]; password [email or web address]  7PM-7AM contact night coverage as above 08/27/2018, 12:12 PM  LOS: 3 days   Consultants:  Nephrology  VVS  EP  PMT  Procedures:  Technically successful fistulogram with angioplasty.    Antimicrobials:    Interval history/Subjective: Overall feels okay.    Objective: Vitals:  Vitals:   08/27/18 0739 08/27/18 0811  BP: (!) 144/45 (!) 154/43  Pulse: 62 60  Resp: 18 16  Temp: 98 F (36.7 C) 98 F (36.7 C)  SpO2: 100% 100%     Exam: Constitutional:   . Appears calm and comfortable Respiratory:  . CTA bilaterally, no w/r/r.  . Respiratory effort normal.  Cardiovascular:  . RRR, no m/r/g . No LE extremity edema   Psychiatric:  . Mental status o Mood, affect appropriate  I have personally reviewed the following:   Labs:  CBG stable  BMP unremarkable  CBC stable   Scheduled Meds: . [MAR Hold] allopurinol  100 mg Oral Daily  . [MAR Hold] aspirin  81 mg Oral Daily  . [MAR Hold] Chlorhexidine Gluconate Cloth  6 each Topical Q0600  . [MAR Hold] Chlorhexidine Gluconate Cloth  6 each Topical Q0600  . [MAR Hold] diazepam  5 mg Oral QHS  . [MAR Hold] dimethicone  1 application Topical BID  . [MAR Hold] dorzolamide-timolol  1 drop Left Eye BID  . [MAR Hold] doxercalciferol  1 mcg Intravenous Q M,W,F-HD  . [MAR Hold] heparin  5,000 Units Subcutaneous Q8H  . [MAR Hold] hydroxypropyl methylcellulose / hypromellose  1 drop Both Eyes TID  . [MAR Hold] loratadine  10 mg Oral Daily  . [MAR Hold] mouth rinse  15 mL Mouth Rinse BID  . [MAR Hold] Melatonin  6 mg Oral QHS  . [MAR Hold] midodrine  10 mg Oral Q M,W,F-HD  . [MAR Hold] multivitamin  1 tablet Oral QHS  . [MAR Hold] mupirocin ointment  1 application Nasal BID   Continuous Infusions: . sodium chloride 10 mL/hr at 08/27/18 1140  . [MAR Hold] ferric gluconate (FERRLECIT/NULECIT) IV 62.5 mg (08/25/18 2143)  . [MAR Hold] vancomycin      Principal Problem:   Junctional bradycardia Active Problems:   DM type 2, uncontrolled, with renal complications (HCC)   ESRD on dialysis (HCC)   Chronic diastolic (congestive) heart failure ([place])  Paroxysmal atrial fibrillation (HCC)   Chronic respiratory failure with hypoxia (HCC)   Gout   Anxiety   Symptomatic bradycardia   SA node dysfunction (HCC)   LOS: 3 days

## 2018-08-27 NOTE — Anesthesia Preprocedure Evaluation (Addendum)
Anesthesia Evaluation  Patient identified by MRN, date of birth, ID band Patient awake    Reviewed: Allergy & Precautions, NPO status , Patient's Chart, lab work & pertinent test results  Airway Mallampati: II  TM Distance: >3 FB Neck ROM: Full    Dental  (+) Edentulous Upper, Edentulous Lower   Pulmonary    breath sounds clear to auscultation       Cardiovascular hypertension,  Rhythm:Regular Rate:Normal     Neuro/Psych    GI/Hepatic   Endo/Other  diabetes  Renal/GU      Musculoskeletal   Abdominal   Peds  Hematology   Anesthesia Other Findings   Reproductive/Obstetrics                             Anesthesia Physical Anesthesia Plan  ASA: III  Anesthesia Plan: MAC   Post-op Pain Management:    Induction: Intravenous  PONV Risk Score and Plan: Propofol infusion  Airway Management Planned: Natural Airway and Simple Face Mask  Additional Equipment:   Intra-op Plan:   Post-operative Plan:   Informed Consent: I have reviewed the patients History and Physical, chart, labs and discussed the procedure including the risks, benefits and alternatives for the proposed anesthesia with the patient or authorized representative who has indicated his/her understanding and acceptance.     Plan Discussed with: CRNA and Anesthesiologist  Anesthesia Plan Comments: (ESRD last HD 08/24/18 K-4.5 Chronic junctional tachycardia not a pacemaker candidate EF: 60-65% by Echo 09/23/17  Hypertension Type 2 DM glucose 117  [name] James)       Anesthesia Quick Evaluation

## 2018-08-27 NOTE — Anesthesia Procedure Notes (Signed)
Procedure Name: MAC Date/Time: 08/27/2018 12:08 PM Performed by: Leonor Liv, CRNA Pre-anesthesia Checklist: Patient identified, Emergency Drugs available, Suction available, Patient being monitored and Timeout performed Patient Re-evaluated:Patient Re-evaluated prior to induction Oxygen Delivery Method: Simple face mask Placement Confirmation: positive ETCO2

## 2018-08-27 NOTE — Progress Notes (Signed)
Palliative:  I have noted recommendations from Dr. [name]. Ms. [name] James is in procedure and dialysis today. She has been clear about her goals for full, aggressive care including full code. Her daughter especially and her son greatly influence her decisions and should be included in further conversations if she further declines. Please call for further follow up as indicated over the weekend.   No charge  Vinie Sill, NP Palliative Medicine Team Pager # [phone] (M-F 8a-5p) Team Phone # [phone] (Nights/Weekends)

## 2018-08-27 NOTE — Transfer of Care (Signed)
Immediate Anesthesia Transfer of Care Note  Patient: [name] James  Procedure(s) Performed: LIGATION ARTERIOVENOUS GORTEX GRAFT ARM (Left Arm Upper)  Patient Location: PACU  Anesthesia Type:MAC  Level of Consciousness: awake, alert  and oriented  Airway & Oxygen Therapy: Patient Spontanous Breathing and Patient connected to nasal cannula oxygen  Post-op Assessment: Report given to RN and Post -op Vital signs reviewed and stable  Post vital signs: Reviewed and stable  Last Vitals:  Vitals Value Taken Time  BP 143/58 08/27/2018 12:49 PM  Temp    Pulse 59 08/27/2018 12:49 PM  Resp 7 08/27/2018 12:49 PM  SpO2 100 % 08/27/2018 12:49 PM  Vitals shown include unvalidated device data.  Last Pain:  Vitals:   08/27/18 0811  TempSrc: Oral  PainSc:       Patients Stated Pain Goal: 2 (20/81/38 8719)  Complications: No apparent anesthesia complications

## 2018-08-27 NOTE — Interval H&P Note (Signed)
History and Physical Interval Note:  08/27/2018 11:29 AM  [name] James  has presented today for surgery, with the diagnosis of end stage renal disease  The various methods of treatment have been discussed with the patient and family. After consideration of risks, benefits and other options for treatment, the patient has consented to  Procedure(s): LIGATION ARTERIOVENOUS GORTEX GRAFT ARM (Left) as a surgical intervention .  The patient's history has been reviewed, patient examined, no change in status, stable for surgery.  I have reviewed the patient's chart and labs.  Questions were answered to the patient's satisfaction.     [name]

## 2018-08-27 NOTE — Anesthesia Postprocedure Evaluation (Signed)
Anesthesia Post Note  Patient: [name] James  Procedure(s) Performed: LIGATION ARTERIOVENOUS GORTEX GRAFT ARM (Left Arm Upper)     Patient location during evaluation: PACU Anesthesia Type: MAC Level of consciousness: awake and alert Pain management: pain level controlled Vital Signs Assessment: post-procedure vital signs reviewed and stable Respiratory status: spontaneous breathing, nonlabored ventilation, respiratory function stable and patient connected to nasal cannula oxygen Cardiovascular status: stable and blood pressure returned to baseline Postop Assessment: no apparent nausea or vomiting Anesthetic complications: no    Last Vitals:  Vitals:   08/27/18 1315 08/27/18 1320  BP:    Pulse: (!) 56 (!) 57  Resp: 13 16  Temp:  36.6 C  SpO2: 100% 100%    Last Pain:  Vitals:   08/27/18 1320  TempSrc:   PainSc: Asleep                 Karstyn Birkey COKER

## 2018-08-27 NOTE — Op Note (Signed)
    NAME: LEILANI CESPEDES    MRN: [id number] DOB: 04/21/1936    DATE OF OPERATION: 08/27/2018  PREOP DIAGNOSIS:    Venous hypertension left upper extremity  POSTOP DIAGNOSIS:    Same  PROCEDURE:    LIGATION LEFT UPPER ARM AV GRAFT:  SURGEON: Judeth Cornfield. Scot Dock, MD, FACS  ASSIST: None  ANESTHESIA: Local with sedation  EBL: Minimal  INDICATIONS:    [name] James is a 82 y.o. female with a known central venous occlusion on the left and venous hypertension.  She presents for ligation of her upper arm graft.  She has a functioning right arm graft.  FINDINGS:   The graft was ligated with two 2-0 silk ties.  TECHNIQUE:   The patient was taken to the operating and sedated by anesthesia.  The left upper extremity was prepped and draped in usual sterile fashion.  After the skin was anesthetized with 1% lidocaine, an incision was made over the proximal graft.  This was dissected free and ligated with two 2-0 silk ties.  Hemostasis was obtained in the wound.  The wound was then closed with a deep layer of 3-0 Vicryl and the skin closed with 4-0 Vicryl.  Dermabond was applied.  The patient tolerated the procedure well was transferred to the recovery room in stable condition all needle and sponge counts were correct.  [name], MD, FACS [facility]  DATE OF DICTATION:   08/27/2018

## 2018-08-27 NOTE — Progress Notes (Addendum)
[name] James Progress Note   Subjective:  Seen in room, s/p L AVG ligation this morning. No pain currently, no CP/dyspnea. For HD this evening.  Objective Vitals:   08/27/18 1305 08/27/18 1310 08/27/18 1315 08/27/18 1320  BP: (!) 146/67     Pulse: (!) 57 (!) 55 (!) 56 (!) 57  Resp: 14 12 13 16   Temp:    97.8 F (36.6 C)  TempSrc:      SpO2: 100% 99% 100% 100%  Weight:      Height:       Physical Exam General: Well appearing, elderly woman, NAD. Heart: RRR; no murmur Lungs: CTA anteriorly Extremities: No LE edema Dialysis Access: R forearm AVG, glued incision from ligation mid-LUE  Additional Objective Labs: Basic Metabolic Panel: Recent Labs  Lab 08/25/18 0328 08/26/18 0446 08/27/18 0418  NA 137 138 136  K 4.9 4.0 4.5  CL 102 100 98  CO2 21* 27 22  GLUCOSE 121* 89 124*  BUN 48* 23 41*  CREATININE 5.86* 3.78* 5.12*  CALCIUM 7.6* 7.6* 7.2*   Liver Function Tests: Recent Labs  Lab 08/23/18 1850  AST 50*  ALT 27  ALKPHOS 132*  BILITOT 0.7  PROT 7.6  ALBUMIN 3.2*   CBC: Recent Labs  Lab 08/23/18 1850  08/24/18 0423 08/25/18 0328 08/26/18 0446 08/27/18 0418  WBC 7.4  --  9.3 10.1 7.7 8.1  NEUTROABS 4.0  --   --   --   --   --   HGB 13.8   < > 12.9 11.8* 11.8* 11.3*  HCT 43.5   < > 41.2 38.4 38.4 36.4  MCV 104.8*  --  105.1* 106.1* 105.2* 105.5*  PLT 158  --  150 166 147* 160   < > = values in this interval not displayed.   Blood Culture    Component Value Date/Time   SDES BLOOD LEFT EJ 08/23/2018 1852   SPECREQUEST  08/23/2018 1852    BOTTLES DRAWN AEROBIC AND ANAEROBIC Blood Culture adequate volume   CULT  08/23/2018 1852    NO GROWTH 3 DAYS Performed at [facility], [place] [address], [place], [place] [address]    REPTSTATUS PENDING 08/23/2018 6720   Studies/Results: Ir US Guide Vasc Access Right  Result Date: 08/25/2018 INDICATION: End-stage renal disease now with poorly functioning right forearm dialysis graft.  Dialysis graft was created approximately 2 months ago and has been utilized for dialysis for the past month, now with poor flows during cannulation. EXAM: 1. ULTRASOUND GUIDANCE FOR DIALYSIS GRAFT ACCESS. 2. FLUOROSCOPIC GUIDED SHUNTOGRAM AND VENOUS ANGIOPLASTY COMPARISON:  None. - this is the first intervention on the patient's right forearm dialysis graft MEDICATIONS: None. CONTRAST:  40 cc Isovue-300 ANESTHESIA/SEDATION: None FLUOROSCOPY TIME:  3 minutes, 18 seconds (2.5 mGy) COMPLICATIONS: None immediate. PROCEDURE: Informed written consent was obtained from the patient after a discussion of the risk, benefits and alternatives to treatment. Questions regarding the procedure were encouraged and answered. A timeout was performed prior to the initiation of the procedure. The skin overlying the right forearm dialysis graft was prepped with Betadine in a sterile fashion, and a sterile drape was applied covering the operative field. A diagnostic shunt study was performed via an 18 gauge angiocatheter introduced into venous outflow. Venous drainage was assessed to the level of the central veins in the chest. Proximal shunt was studied by reflux maneuver with temporary compression of venous outflow. The angiocath was removed and replaced with a 6-French sheath  over a guidewire. Balloon angioplasty of the venous anastomosis and peripheral aspect of the draining basilic vein initially with a 22mm x 4 cm Conquest balloon. Post angioplasty shuntogram images were obtained and residual narrowing was treated with prolonged balloon angioplasty with a 7 mm x 4 cm Conquest balloon. Completion shuntogram images were obtained. The sheath was removed and hemostasis obtained with application of a 2-0 Ethilon pursestring suture which will be removed at the patient's next dialysis session. A dressing was placed. The patient tolerated the procedure well without immediate postprocedural complication. FINDINGS: The patient's right forearm  brachial-basilic AV graft is widely patent, however there is a moderate to long length severe (approximately 75%) luminal narrowing of the venous anastomosis and peripheral aspect of the draining basilic vein which was successfully treated with prolonged balloon angioplasty ultimately to 7 mm diameter. Completion shuntogram images demonstrate wide patency of the venous anastomosis and peripheral aspect of the draining basilic vein. The remainder of the venous limb of the dialysis graft is widely patent. The remainder of the right upper extremities venous system is widely patent to the level the superior aspect of the right atrium. Reflux shuntogram demonstrates wide patency of the arterial limb and anastomosis. IMPRESSION: Successful balloon angioplasty of moderate to long length severe (approximately 75%) luminal narrowing of the venous anastomosis and peripheral aspect of the draining basilic vein ultimately to 7 mm diameter. ACCESS: This access remains amenable to future percutaneous interventions as clinically indicated. Electronically Signed   By: Sandi Mariscal M.D.   On: 08/25/2018 16:23   Ir Av Dialy Shunt Intro Needle/intracath Initial W/pta/img Right  Result Date: 08/25/2018 INDICATION: End-stage renal disease now with poorly functioning right forearm dialysis graft. Dialysis graft was created approximately 2 months ago and has been utilized for dialysis for the past month, now with poor flows during cannulation. EXAM: 1. ULTRASOUND GUIDANCE FOR DIALYSIS GRAFT ACCESS. 2. FLUOROSCOPIC GUIDED SHUNTOGRAM AND VENOUS ANGIOPLASTY COMPARISON:  None. - this is the first intervention on the patient's right forearm dialysis graft MEDICATIONS: None. CONTRAST:  40 cc Isovue-300 ANESTHESIA/SEDATION: None FLUOROSCOPY TIME:  3 minutes, 18 seconds (2.5 mGy) COMPLICATIONS: None immediate. PROCEDURE: Informed written consent was obtained from the patient after a discussion of the risk, benefits and alternatives to  treatment. Questions regarding the procedure were encouraged and answered. A timeout was performed prior to the initiation of the procedure. The skin overlying the right forearm dialysis graft was prepped with Betadine in a sterile fashion, and a sterile drape was applied covering the operative field. A diagnostic shunt study was performed via an 18 gauge angiocatheter introduced into venous outflow. Venous drainage was assessed to the level of the central veins in the chest. Proximal shunt was studied by reflux maneuver with temporary compression of venous outflow. The angiocath was removed and replaced with a 6-French sheath over a guidewire. Balloon angioplasty of the venous anastomosis and peripheral aspect of the draining basilic vein initially with a 33mm x 4 cm Conquest balloon. Post angioplasty shuntogram images were obtained and residual narrowing was treated with prolonged balloon angioplasty with a 7 mm x 4 cm Conquest balloon. Completion shuntogram images were obtained. The sheath was removed and hemostasis obtained with application of a 2-0 Ethilon pursestring suture which will be removed at the patient's next dialysis session. A dressing was placed. The patient tolerated the procedure well without immediate postprocedural complication. FINDINGS: The patient's right forearm brachial-basilic AV graft is widely patent, however there is a moderate to long length severe (  approximately 75%) luminal narrowing of the venous anastomosis and peripheral aspect of the draining basilic vein which was successfully treated with prolonged balloon angioplasty ultimately to 7 mm diameter. Completion shuntogram images demonstrate wide patency of the venous anastomosis and peripheral aspect of the draining basilic vein. The remainder of the venous limb of the dialysis graft is widely patent. The remainder of the right upper extremities venous system is widely patent to the level the superior aspect of the right atrium.  Reflux shuntogram demonstrates wide patency of the arterial limb and anastomosis. IMPRESSION: Successful balloon angioplasty of moderate to long length severe (approximately 75%) luminal narrowing of the venous anastomosis and peripheral aspect of the draining basilic vein ultimately to 7 mm diameter. ACCESS: This access remains amenable to future percutaneous interventions as clinically indicated. Electronically Signed   By: Sandi Mariscal M.D.   On: 08/25/2018 16:23   Medications: . ferric gluconate (FERRLECIT/NULECIT) IV 62.5 mg (08/25/18 2143)   . allopurinol  100 mg Oral Daily  . aspirin  81 mg Oral Daily  . Chlorhexidine Gluconate Cloth  6 each Topical Q0600  . Chlorhexidine Gluconate Cloth  6 each Topical Q0600  . diazepam  5 mg Oral QHS  . dimethicone  1 application Topical BID  . dorzolamide-timolol  1 drop Left Eye BID  . doxercalciferol  1 mcg Intravenous Q M,W,F-HD  . heparin  5,000 Units Subcutaneous Q8H  . hydroxypropyl methylcellulose / hypromellose  1 drop Both Eyes TID  . loratadine  10 mg Oral Daily  . mouth rinse  15 mL Mouth Rinse BID  . Melatonin  6 mg Oral QHS  . midodrine  10 mg Oral Q M,W,F-HD  . multivitamin  1 tablet Oral QHS  . mupirocin ointment  1 application Nasal BID    Dialysis Orders: [facility]  3h 44min  69.5kg   3K/2.25 bath  Hep 3300   RFA AVG  - hect 1 ug  - venofer 100 x 5 thru 9/11 then 50/wk  - no mircera currently  Assessment/Plan: 1. Bradycardia: Indicated for pacemaker, but no good areas - monitoring for now. Per cardiology/EP. 2. ESRD: Continue HD per MWF schedule. HD later today. 3. HypoTN/volume: Close to EDW, no edema. BP stable on midodrine. 4. Anemia: Hgb 11.8. No ESA for now. 5. Secondary hyperparathyroidism: Corr Ca/Phos ok. No binder or VDRA currently. 6. Nutrition: Alb low, adding protein supps. 7. Type 2 DM 8. A-fib: Off amiodarone since 06/2018; not anticoagulated. 9. HD access: S/p L AVG ligation 9/6 - has useable R  AVG  [name] Penton, PA-C 08/27/2018, 2:20 PM  [facility] Pager: [phone]  Pt seen, examined and agree w A/P as above.  Kelly Splinter MD [employer] pager [phone]   08/27/2018, 3:37 PM

## 2018-08-28 ENCOUNTER — Encounter (HOSPITAL_COMMUNITY): Payer: Self-pay | Admitting: Vascular Surgery

## 2018-08-28 DIAGNOSIS — R498 Other voice and resonance disorders: Secondary | ICD-10-CM | POA: Diagnosis not present

## 2018-08-28 DIAGNOSIS — R41841 Cognitive communication deficit: Secondary | ICD-10-CM | POA: Diagnosis not present

## 2018-08-28 DIAGNOSIS — R079 Chest pain, unspecified: Secondary | ICD-10-CM | POA: Diagnosis not present

## 2018-08-28 DIAGNOSIS — J9611 Chronic respiratory failure with hypoxia: Secondary | ICD-10-CM | POA: Diagnosis not present

## 2018-08-28 DIAGNOSIS — Z8679 Personal history of other diseases of the circulatory system: Secondary | ICD-10-CM | POA: Diagnosis not present

## 2018-08-28 DIAGNOSIS — M6281 Muscle weakness (generalized): Secondary | ICD-10-CM | POA: Diagnosis not present

## 2018-08-28 DIAGNOSIS — I480 Paroxysmal atrial fibrillation: Secondary | ICD-10-CM | POA: Diagnosis not present

## 2018-08-28 DIAGNOSIS — N186 End stage renal disease: Secondary | ICD-10-CM | POA: Diagnosis not present

## 2018-08-28 DIAGNOSIS — I953 Hypotension of hemodialysis: Secondary | ICD-10-CM | POA: Diagnosis not present

## 2018-08-28 DIAGNOSIS — E211 Secondary hyperparathyroidism, not elsewhere classified: Secondary | ICD-10-CM | POA: Diagnosis not present

## 2018-08-28 DIAGNOSIS — D631 Anemia in chronic kidney disease: Secondary | ICD-10-CM | POA: Diagnosis not present

## 2018-08-28 DIAGNOSIS — E1165 Type 2 diabetes mellitus with hyperglycemia: Secondary | ICD-10-CM | POA: Diagnosis not present

## 2018-08-28 DIAGNOSIS — N2581 Secondary hyperparathyroidism of renal origin: Secondary | ICD-10-CM | POA: Diagnosis not present

## 2018-08-28 DIAGNOSIS — I5032 Chronic diastolic (congestive) heart failure: Secondary | ICD-10-CM | POA: Diagnosis not present

## 2018-08-28 DIAGNOSIS — R110 Nausea: Secondary | ICD-10-CM | POA: Diagnosis not present

## 2018-08-28 DIAGNOSIS — Z7401 Bed confinement status: Secondary | ICD-10-CM | POA: Diagnosis not present

## 2018-08-28 DIAGNOSIS — D509 Iron deficiency anemia, unspecified: Secondary | ICD-10-CM | POA: Diagnosis not present

## 2018-08-28 DIAGNOSIS — E1122 Type 2 diabetes mellitus with diabetic chronic kidney disease: Secondary | ICD-10-CM | POA: Diagnosis not present

## 2018-08-28 DIAGNOSIS — I959 Hypotension, unspecified: Secondary | ICD-10-CM | POA: Diagnosis not present

## 2018-08-28 DIAGNOSIS — M2550 Pain in unspecified joint: Secondary | ICD-10-CM | POA: Diagnosis not present

## 2018-08-28 DIAGNOSIS — Z992 Dependence on renal dialysis: Secondary | ICD-10-CM | POA: Diagnosis not present

## 2018-08-28 DIAGNOSIS — R001 Bradycardia, unspecified: Secondary | ICD-10-CM | POA: Diagnosis not present

## 2018-08-28 DIAGNOSIS — E1129 Type 2 diabetes mellitus with other diabetic kidney complication: Secondary | ICD-10-CM | POA: Diagnosis not present

## 2018-08-28 DIAGNOSIS — R293 Abnormal posture: Secondary | ICD-10-CM | POA: Diagnosis not present

## 2018-08-28 LAB — BASIC METABOLIC PANEL
Anion gap: 15 (ref 5–15)
BUN: 16 mg/dL (ref 8–23)
CHLORIDE: 95 mmol/L — AB (ref 98–111)
CO2: 26 mmol/L (ref 22–32)
CREATININE: 2.85 mg/dL — AB (ref 0.44–1.00)
Calcium: 8.3 mg/dL — ABNORMAL LOW (ref 8.9–10.3)
GFR, EST AFRICAN AMERICAN: 17 mL/min — AB (ref >60)
GFR, EST NON AFRICAN AMERICAN: 15 mL/min — AB (ref >60)
Glucose, Bld: 142 mg/dL — ABNORMAL HIGH (ref 70–99)
POTASSIUM: 3.5 mmol/L (ref 3.5–5.1)
SODIUM: 136 mmol/L (ref 135–145)

## 2018-08-28 LAB — CBC
HCT: 36.4 % (ref 36.0–46.0)
Hemoglobin: 11.4 g/dL — ABNORMAL LOW (ref 12.0–15.0)
MCH: 32.6 pg (ref 26.0–34.0)
MCHC: 31.3 g/dL (ref 30.0–36.0)
MCV: 104 fL — AB (ref 78.0–100.0)
PLATELETS: 172 10*3/uL (ref 150–400)
RBC: 3.5 MIL/uL — AB (ref 3.87–5.11)
RDW: 15.3 % (ref 11.5–15.5)
WBC: 9.6 10*3/uL (ref 4.0–10.5)

## 2018-08-28 LAB — GLUCOSE, CAPILLARY: Glucose-Capillary: 120 mg/dL — ABNORMAL HIGH (ref 70–99)

## 2018-08-28 LAB — CULTURE, BLOOD (SINGLE)
CULTURE: NO GROWTH
SPECIAL REQUESTS: ADEQUATE

## 2018-08-28 MED ORDER — DOXERCALCIFEROL 4 MCG/2ML IV SOLN
INTRAVENOUS | Status: AC
  Administered 2018-08-28: 1 ug via INTRAVENOUS
  Filled 2018-08-28: qty 2

## 2018-08-28 NOTE — Progress Notes (Signed)
Patient will Discharge To: [place] Date:08/28/18 Family Notified: yes Coralyn Mark [phone] Transport By: Corey Harold   Per MD patient ready for DC to [facility] . RN, patient, patient's family, and facility notified of DC. Assessment, Fl2/Pasrr, and Discharge Summary sent to facility. RN given number for report [phone], Room # 205A). DC packet on chart. Ambulance transport requested for patient.   CSW signing off.  Reed Breech LCSWA [phone]

## 2018-08-28 NOTE — Progress Notes (Signed)
HD tx initiated via 16Gx2 w/o problem, pull/push/flush well w/o problem, VSS w/ high bp this time (not usual for this pt), will cont to monitor while on HD tx

## 2018-08-28 NOTE — Progress Notes (Signed)
Nsg Discharge Note  Admit Date:  08/23/2018 Discharge date: 08/28/2018   [name] James to be D/C'd [name] James per MD order.  AVS completed.  Copy for chart, and copy for patient placed in envelope, and given to PTAR. Patient/caregiver able to verbalize understanding.  Discharge Medication: Allergies as of 08/28/2018      Reactions   Erythromycin Swelling   Codeine Palpitations, Rash, Other (See Comments)   GI Upset   Erythromycin Base Other (See Comments)   GI Upset   Penicillin G Rash   Has patient had a PCN reaction causing immediate rash, facial/tongue/throat swelling, SOB or lightheadedness with hypotension: Yes Has patient had a PCN reaction causing severe rash involving mucus membranes or skin necrosis: Unk Has patient had a PCN reaction that required hospitalization: Unk Has patient had a PCN reaction occurring within the last 10 years: Unk If all of the above answers are "NO", then may proceed with Cephalosporin use.Marland Kitchen   Penicillins Rash, Other (See Comments)   Has taken Keflex & Rocephin many times without problems Has patient had a PCN reaction causing immediate rash, facial/tongue/throat swelling, SOB or lightheadedness with hypotension: Yes Has patient had a PCN reaction causing severe rash involving mucus membranes or skin necrosis: Unk Has patient had a PCN reaction that required hospitalization: Unk Has patient had a PCN reaction occurring within the last 10 years: Unk If all of the above answers are "NO", then may proceed with Cephalosporin use.   Zantac [ranitidine Hcl] Nausea And Vomiting      Medication List    STOP taking these medications   oxyCODONE 5 MG immediate release tablet Commonly known as:  Oxy IR/ROXICODONE   promethazine 25 MG tablet Commonly known as:  PHENERGAN     TAKE these medications   acetaminophen 325 MG tablet Commonly known as:  TYLENOL Take 650 mg by mouth every 6 (six) hours as needed for fever ("for vomiting/nausea," per [facility]).    allopurinol 100 MG tablet Commonly known as:  ZYLOPRIM Take 100 mg by mouth daily.   aspirin 81 MG chewable tablet Chew 81 mg by mouth daily.   diazepam 5 MG tablet Commonly known as:  VALIUM Take 1 tablet (5 mg total) by mouth at bedtime.   DIMETHICONE (TOPICAL) 5 % Crea Apply 1 application topically See admin instructions. Place a nickel-sized amount onto buttock(s) area 2 times a day for barrier protection   dorzolamide-timolol 22.3-6.8 MG/ML ophthalmic solution Commonly known as:  COSOPT Place 1 drop into the left eye 2 (two) times daily. Wait 2-5 minutes when giving two eye meds   hydrALAZINE 10 MG tablet Commonly known as:  APRESOLINE Take 5 mg by mouth 3 (three) times daily as needed (hypertension).   loperamide 2 MG tablet Commonly known as:  IMODIUM A-D Take 2 mg by mouth 3 (three) times daily as needed (for diarrhea).   loratadine 10 MG tablet Commonly known as:  CLARITIN Take 10 mg by mouth daily.   Melatonin 5 MG Tabs Take 6 mg by mouth at bedtime.   midodrine 10 MG tablet Commonly known as:  PROAMATINE Take 10 mg by mouth See admin instructions. Take 10 mg by mouth WITH MORNING MEDS AT FACILITY on Mon/Wed/Fri- dialysis days Take 10 mg by mouth with dialysis on Mon/Wed/Fri   NEPHRO VITAMINS 0.8 MG Tabs Take 1 tablet by mouth daily.   OXYGEN Inhale 2 L into the lungs continuous.   SYSTANE ULTRA 0.4-0.3 % Soln Generic drug:  Polyethyl Glycol-Propyl  Glycol Place 1 drop into both eyes 3 (three) times daily.       Discharge Assessment: Vitals:   08/28/18 0914 08/28/18 1314  BP: (!) 155/55 (!) 154/66  Pulse: 64 61  Resp: 15 14  Temp:    SpO2: 100% 100%   Skin clean, dry and intact without evidence of skin break down, no evidence of skin tears noted. IV catheter discontinued intact. Site without signs and symptoms of complications - no redness or edema noted at insertion site, patient denies c/o pain - only slight tenderness at site.  Dressing with  slight pressure applied.  D/c Instructions-Education: Report called to [facility] and given to [employer]. Number left if they had any questions.  Ginette Pitman, RN 08/28/2018 3:46 PM

## 2018-08-28 NOTE — Progress Notes (Signed)
HD tx completed @ 0342 w/o problem, UF goal met, blood rinsed back, VSS w/ increased bp for this pt, suture from shuntagram removed per handwritten order on pt's HD tx paper orders, report called to Isaiah Batoon, RN 

## 2018-08-28 NOTE — Progress Notes (Addendum)
[facility] Progress Note   Subjective:  Seen in room. No CP/dypsnea. Didn't finish HD until early this morning (she was bumped d/t several emergent cases); apologized for this. She is refusing the Pro-stat supps; says they make her vomit.   Objective Vitals:   08/28/18 0500 08/28/18 0803 08/28/18 0830 08/28/18 0914  BP:   (!) 148/71 (!) 155/55  Pulse:   67 64  Resp:   17 15  Temp:  98.1 F (36.7 C)    TempSrc:  Oral    SpO2:   100% 100%  Weight: 71.1 kg     Height:       Physical Exam General: Well appearing, elderly woman, NAD. Heart: RRR; no murmur Lungs: CTA anteriorly Extremities: No LE edema Dialysis Access: R forearm AVG, glued incision from ligation mid-LUE  Additional Objective Labs: Basic Metabolic Panel: Recent Labs  Lab 08/26/18 0446 08/27/18 0418 08/28/18 0614  NA 138 136 136  K 4.0 4.5 3.5  CL 100 98 95*  CO2 27 22 26   GLUCOSE 89 124* 142*  BUN 23 41* 16  CREATININE 3.78* 5.12* 2.85*  CALCIUM 7.6* 7.2* 8.3*   Liver Function Tests: Recent Labs  Lab 08/23/18 1850  AST 50*  ALT 27  ALKPHOS 132*  BILITOT 0.7  PROT 7.6  ALBUMIN 3.2*   CBC: Recent Labs  Lab 08/23/18 1850  08/24/18 0423 08/25/18 0328 08/26/18 0446 08/27/18 0418 08/28/18 0614  WBC 7.4  --  9.3 10.1 7.7 8.1 9.6  NEUTROABS 4.0  --   --   --   --   --   --   HGB 13.8   < > 12.9 11.8* 11.8* 11.3* 11.4*  HCT 43.5   < > 41.2 38.4 38.4 36.4 36.4  MCV 104.8*  --  105.1* 106.1* 105.2* 105.5* 104.0*  PLT 158  --  150 166 147* 160 172   < > = values in this interval not displayed.   CBG: Recent Labs  Lab 08/26/18 0805 08/27/18 0737 08/27/18 1118 08/27/18 1258 08/28/18 0801  GLUCAP 81 117* 93 129* 120*   Medications: . ferric gluconate (FERRLECIT/NULECIT) IV 62.5 mg (08/25/18 2143)   . allopurinol  100 mg Oral Daily  . aspirin  81 mg Oral Daily  . Chlorhexidine Gluconate Cloth  6 each Topical Q0600  . Chlorhexidine Gluconate Cloth  6 each Topical Q0600   . diazepam  5 mg Oral QHS  . dimethicone  1 application Topical BID  . dorzolamide-timolol  1 drop Left Eye BID  . doxercalciferol  1 mcg Intravenous Q M,W,F-HD  . feeding supplement (PRO-STAT SUGAR FREE 64)  30 mL Oral BID  . heparin  5,000 Units Subcutaneous Q8H  . hydroxypropyl methylcellulose / hypromellose  1 drop Both Eyes TID  . loratadine  10 mg Oral Daily  . mouth rinse  15 mL Mouth Rinse BID  . Melatonin  6 mg Oral QHS  . midodrine  10 mg Oral Q M,W,F-HD  . multivitamin  1 tablet Oral QHS  . mupirocin ointment  1 application Nasal BID    Dialysis Orders: [facility] 3h 47min 69.5kg 3K/2.25 bath Hep 3300 RFA AVG - hect 1 ug - venofer 100 x 5 thru 9/11 then 50/wk - no mircera currently  Assessment/Plan: 1. Bradycardia: Indicated for pacemaker, but no good areas - monitoring for now. Per cardiology/EP. 2. ESRD: Continue HD per MWF schedule. Next 9/9. 3. HypoTN/volume: Close to EDW, no edema. BP stable on midodrine. 4.  Anemia: Hgb 11.4. No ESA for now. 5. Secondary hyperparathyroidism: Corr Ca/Phos ok. No binder or VDRA currently. 6. Nutrition: Alb low, pt declined Nepro and Pro-stat d/t GI upset 7. Type 2 DM 8. A-fib: Off amiodarone since 06/2018; not anticoagulated. 9. HD access: S/p L AVG ligation 9/6 - has useable R AVG 10. DIspo: Ok from renal standpoint.  [name], PA-C 08/28/2018, 10:28 AM  [facility] Pager: [phone]  Pt seen, examined and agree w A/P as above.  Kelly Splinter MD [employer] pager [phone]   08/28/2018, 2:04 PM

## 2018-08-28 NOTE — Discharge Summary (Signed)
Physician Discharge Summary  EVALINE WALTMAN JSH:[id number] DOB: 1936/07/14 DOA: 08/23/2018  PCP: Patrick Jupiter  Admit date: 08/23/2018 Discharge date: 08/28/2018  Recommendations for Outpatient Follow-up:   Junctional bradycardia, hypotension, not on any rate control agents. -- Electrophysiology recommended against pacemaker placement of any kind because of comorbidities, risk of infection and morbidity associated with surgery.  Would consider discontinuing timolol eyedrops.  Theophylline was also considered although the therapeutic index is negative and this was deferred at this time.  Follow-up Information    Place, Ronney Lion. Schedule an appointment as soon as possible for a visit in 1 week(s).   Specialty:  [place] information: [place] [address] [phone]        Sanda Klein, MD .   Specialty:  Cardiology Contact information: [address] [place] [address] [phone]            Discharge Diagnoses:  1. Junctional bradycardia, hypotension 2. Paroxysmal atrial fibrillation not on anticoagulation 3. End-stage renal disease 4. Chronic hypoxic resp failure  5. Diabetes mellitus type 2, diet-controlled  Discharge Condition: improved Disposition: SNF  Diet recommendation: heart healthy diabetic diet  Filed Weights   08/27/18 1137 08/27/18 2346 08/28/18 0500  Weight: 68.9 kg 68.9 kg 71.1 kg    History of present illness:  82 year old woman PMH diabetes mellitus, atrial fibrillation, end-stage renal disease presented with generalized weakness, bradycardia, hypotension.  Admitted for hypotension, bradycardia.  Some response to fluids and atropine.  Hospital Course:  Patient was observed, bradycardia and hypotension spontaneously resolved.  She converted to sinus rhythm and remained for the rest of the hospitalization this rhythm.  She is now slightly hypertensive.  She was seen by electrophysiology and  because of her complex medical history felt not to be a candidate for any pacemaker placement.  Suggestions included discontinuing ophthalmic timolol drop in consideration of theophylline although not thought to be a good choice given the narrow therapeutic index.  At this point plan is observation, consider discontinuing atenolol which would be done in concert with ophthalmology next week.  I discussed these recommendations with the daughter Kenney Houseman by telephone today and she was agreeable.  Patient was also seen by nephrology for routine hemodialysis and by vascular surgery given already planned procedure of left upper arm graft.  This was done without complication.  Junctional bradycardia, hypotension, not on any rate control agents. --Remains in sinus rhythm.  Slightly hypertensive.  Electrophysiology recommended against pacemaker placement of any kind because of comorbidities, risk of infection and morbidity associated with surgery.  Would consider discontinuing timolol eyedrops.  Theophylline was also considered although the therapeutic index is negative and this was deferred at this time.  Paroxysmal atrial fibrillation not on anticoagulation --Remains in sinus rhythm.  No rate control agents.  End-stage renal disease.  Status post fistulogram with angioplasty per interventional radiology.  Status post ligation of left upper arm graft. --Continue management per nephrology.  Chronic hypoxic resp failure  --Continue home oxygen   Diabetes mellitus type 2, diet-controlled --Remains stable.  Consultants:  Nephrology  VVS  EP  PMT  Procedures:  Technically successful fistulogram with angioplasty.   LIGATION LEFT UPPER ARM AV GRAFT:  Today's assessment: S: feels weel O: Vitals:  Vitals:   08/28/18 0830 08/28/18 0914  BP: (!) 148/71 (!) 155/55  Pulse: 67 64  Resp: 17 15  Temp:    SpO2: 100% 100%    Constitutional:  . Appears calm and comfortable Respiratory:   .  CTA bilaterally, no w/r/r.  . Respiratory effort normal.  Cardiovascular:  . RRR, no m/r/g . No LE extremity edema   . Telemetry SR Psychiatric:  . judgement and insight appear normal . Mental status o Mood, affect appropriate  BMP stable CBC stable  Discharge Instructions   Allergies as of 08/28/2018      Reactions   Erythromycin Swelling   Codeine Palpitations, Rash, Other (See Comments)   GI Upset   Erythromycin Base Other (See Comments)   GI Upset   Penicillin G Rash   Has patient had a PCN reaction causing immediate rash, facial/tongue/throat swelling, SOB or lightheadedness with hypotension: Yes Has patient had a PCN reaction causing severe rash involving mucus membranes or skin necrosis: Unk Has patient had a PCN reaction that required hospitalization: Unk Has patient had a PCN reaction occurring within the last 10 years: Unk If all of the above answers are "NO", then may proceed with Cephalosporin use.Marland Kitchen   Penicillins Rash, Other (See Comments)   Has taken Keflex & Rocephin many times without problems Has patient had a PCN reaction causing immediate rash, facial/tongue/throat swelling, SOB or lightheadedness with hypotension: Yes Has patient had a PCN reaction causing severe rash involving mucus membranes or skin necrosis: Unk Has patient had a PCN reaction that required hospitalization: Unk Has patient had a PCN reaction occurring within the last 10 years: Unk If all of the above answers are "NO", then may proceed with Cephalosporin use.   Zantac [ranitidine Hcl] Nausea And Vomiting      Medication List    STOP taking these medications   oxyCODONE 5 MG immediate release tablet Commonly known as:  Oxy IR/ROXICODONE   promethazine 25 MG tablet Commonly known as:  PHENERGAN     TAKE these medications   acetaminophen 325 MG tablet Commonly known as:  TYLENOL Take 650 mg by mouth every 6 (six) hours as needed for fever ("for vomiting/nausea," per [facility]).    allopurinol 100 MG tablet Commonly known as:  ZYLOPRIM Take 100 mg by mouth daily.   aspirin 81 MG chewable tablet Chew 81 mg by mouth daily.   diazepam 5 MG tablet Commonly known as:  VALIUM Take 1 tablet (5 mg total) by mouth at bedtime.   DIMETHICONE (TOPICAL) 5 % Crea Apply 1 application topically See admin instructions. Place a nickel-sized amount onto buttock(s) area 2 times a day for barrier protection   dorzolamide-timolol 22.3-6.8 MG/ML ophthalmic solution Commonly known as:  COSOPT Place 1 drop into the left eye 2 (two) times daily. Wait 2-5 minutes when giving two eye meds   hydrALAZINE 10 MG tablet Commonly known as:  APRESOLINE Take 5 mg by mouth 3 (three) times daily as needed (hypertension).   loperamide 2 MG tablet Commonly known as:  IMODIUM A-D Take 2 mg by mouth 3 (three) times daily as needed (for diarrhea).   loratadine 10 MG tablet Commonly known as:  CLARITIN Take 10 mg by mouth daily.   Melatonin 5 MG Tabs Take 6 mg by mouth at bedtime.   midodrine 10 MG tablet Commonly known as:  PROAMATINE Take 10 mg by mouth See admin instructions. Take 10 mg by mouth WITH MORNING MEDS AT FACILITY on Mon/Wed/Fri- dialysis days Take 10 mg by mouth with dialysis on Mon/Wed/Fri   NEPHRO VITAMINS 0.8 MG Tabs Take 1 tablet by mouth daily.   OXYGEN Inhale 2 L into the lungs continuous.   SYSTANE ULTRA 0.4-0.3 % Soln Generic drug:  Polyethyl  Glycol-Propyl Glycol Place 1 drop into both eyes 3 (three) times daily.      Allergies  Allergen Reactions  . Erythromycin Swelling  . Codeine Palpitations, Rash and Other (See Comments)    GI Upset  . Erythromycin Base Other (See Comments)    GI Upset  . Penicillin G Rash    Has patient had a PCN reaction causing immediate rash, facial/tongue/throat swelling, SOB or lightheadedness with hypotension: Yes Has patient had a PCN reaction causing severe rash involving mucus membranes or skin necrosis: Unk Has patient  had a PCN reaction that required hospitalization: Unk Has patient had a PCN reaction occurring within the last 10 years: Unk If all of the above answers are "NO", then may proceed with Cephalosporin use..  . Penicillins Rash and Other (See Comments)    Has taken Keflex & Rocephin many times without problems Has patient had a PCN reaction causing immediate rash, facial/tongue/throat swelling, SOB or lightheadedness with hypotension: Yes Has patient had a PCN reaction causing severe rash involving mucus membranes or skin necrosis: Unk Has patient had a PCN reaction that required hospitalization: Unk Has patient had a PCN reaction occurring within the last 10 years: Unk If all of the above answers are "NO", then may proceed with Cephalosporin use.   Keturah Shavers [Ranitidine Hcl] Nausea And Vomiting    The results of significant diagnostics from this hospitalization (including imaging, microbiology, ancillary and laboratory) are listed below for reference.    Significant Diagnostic Studies: Ir US Guide Vasc Access Right  Result Date: 08/25/2018 INDICATION: End-stage renal disease now with poorly functioning right forearm dialysis graft. Dialysis graft was created approximately 2 months ago and has been utilized for dialysis for the past month, now with poor flows during cannulation. EXAM: 1. ULTRASOUND GUIDANCE FOR DIALYSIS GRAFT ACCESS. 2. FLUOROSCOPIC GUIDED SHUNTOGRAM AND VENOUS ANGIOPLASTY COMPARISON:  None. - this is the first intervention on the patient's right forearm dialysis graft MEDICATIONS: None. CONTRAST:  40 cc Isovue-300 ANESTHESIA/SEDATION: None FLUOROSCOPY TIME:  3 minutes, 18 seconds (2.5 mGy) COMPLICATIONS: None immediate. PROCEDURE: Informed written consent was obtained from the patient after a discussion of the risk, benefits and alternatives to treatment. Questions regarding the procedure were encouraged and answered. A timeout was performed prior to the initiation of the procedure.  The skin overlying the right forearm dialysis graft was prepped with Betadine in a sterile fashion, and a sterile drape was applied covering the operative field. A diagnostic shunt study was performed via an 18 gauge angiocatheter introduced into venous outflow. Venous drainage was assessed to the level of the central veins in the chest. Proximal shunt was studied by reflux maneuver with temporary compression of venous outflow. The angiocath was removed and replaced with a 6-French sheath over a guidewire. Balloon angioplasty of the venous anastomosis and peripheral aspect of the draining basilic vein initially with a 69mm x 4 cm Conquest balloon. Post angioplasty shuntogram images were obtained and residual narrowing was treated with prolonged balloon angioplasty with a 7 mm x 4 cm Conquest balloon. Completion shuntogram images were obtained. The sheath was removed and hemostasis obtained with application of a 2-0 Ethilon pursestring suture which will be removed at the patient's next dialysis session. A dressing was placed. The patient tolerated the procedure well without immediate postprocedural complication. FINDINGS: The patient's right forearm brachial-basilic AV graft is widely patent, however there is a moderate to long length severe (approximately 75%) luminal narrowing of the venous anastomosis and peripheral aspect of the  draining basilic vein which was successfully treated with prolonged balloon angioplasty ultimately to 7 mm diameter. Completion shuntogram images demonstrate wide patency of the venous anastomosis and peripheral aspect of the draining basilic vein. The remainder of the venous limb of the dialysis graft is widely patent. The remainder of the right upper extremities venous system is widely patent to the level the superior aspect of the right atrium. Reflux shuntogram demonstrates wide patency of the arterial limb and anastomosis. IMPRESSION: Successful balloon angioplasty of moderate to long  length severe (approximately 75%) luminal narrowing of the venous anastomosis and peripheral aspect of the draining basilic vein ultimately to 7 mm diameter. ACCESS: This access remains amenable to future percutaneous interventions as clinically indicated. Electronically Signed   By: Sandi Mariscal M.D.   On: 08/25/2018 16:23   Dg Chest Portable 1 View  Result Date: 08/23/2018 CLINICAL DATA:  Hypotension and bradycardia EXAM: PORTABLE CHEST 1 VIEW COMPARISON:  07/05/2018.  Chest CT 10/05/2017 FINDINGS: Chronic left pleural effusion and lower lobe collapse based on 2018 and 2019 chest CTs. The remainder of the lungs are clear. Normal heart size and mediastinal contours. IMPRESSION: Chronic left lower lobe collapse and small left effusion. No acute finding compared to priors. Electronically Signed   By: Monte Fantasia M.D.   On: 08/23/2018 19:33   Ir Av Dialy Shunt Intro Needle/intracath Initial W/pta/img Right  Result Date: 08/25/2018 INDICATION: End-stage renal disease now with poorly functioning right forearm dialysis graft. Dialysis graft was created approximately 2 months ago and has been utilized for dialysis for the past month, now with poor flows during cannulation. EXAM: 1. ULTRASOUND GUIDANCE FOR DIALYSIS GRAFT ACCESS. 2. FLUOROSCOPIC GUIDED SHUNTOGRAM AND VENOUS ANGIOPLASTY COMPARISON:  None. - this is the first intervention on the patient's right forearm dialysis graft MEDICATIONS: None. CONTRAST:  40 cc Isovue-300 ANESTHESIA/SEDATION: None FLUOROSCOPY TIME:  3 minutes, 18 seconds (2.5 mGy) COMPLICATIONS: None immediate. PROCEDURE: Informed written consent was obtained from the patient after a discussion of the risk, benefits and alternatives to treatment. Questions regarding the procedure were encouraged and answered. A timeout was performed prior to the initiation of the procedure. The skin overlying the right forearm dialysis graft was prepped with Betadine in a sterile fashion, and a sterile drape  was applied covering the operative field. A diagnostic shunt study was performed via an 18 gauge angiocatheter introduced into venous outflow. Venous drainage was assessed to the level of the central veins in the chest. Proximal shunt was studied by reflux maneuver with temporary compression of venous outflow. The angiocath was removed and replaced with a 6-French sheath over a guidewire. Balloon angioplasty of the venous anastomosis and peripheral aspect of the draining basilic vein initially with a 24mm x 4 cm Conquest balloon. Post angioplasty shuntogram images were obtained and residual narrowing was treated with prolonged balloon angioplasty with a 7 mm x 4 cm Conquest balloon. Completion shuntogram images were obtained. The sheath was removed and hemostasis obtained with application of a 2-0 Ethilon pursestring suture which will be removed at the patient's next dialysis session. A dressing was placed. The patient tolerated the procedure well without immediate postprocedural complication. FINDINGS: The patient's right forearm brachial-basilic AV graft is widely patent, however there is a moderate to long length severe (approximately 75%) luminal narrowing of the venous anastomosis and peripheral aspect of the draining basilic vein which was successfully treated with prolonged balloon angioplasty ultimately to 7 mm diameter. Completion shuntogram images demonstrate wide patency of the venous anastomosis  and peripheral aspect of the draining basilic vein. The remainder of the venous limb of the dialysis graft is widely patent. The remainder of the right upper extremities venous system is widely patent to the level the superior aspect of the right atrium. Reflux shuntogram demonstrates wide patency of the arterial limb and anastomosis. IMPRESSION: Successful balloon angioplasty of moderate to long length severe (approximately 75%) luminal narrowing of the venous anastomosis and peripheral aspect of the draining  basilic vein ultimately to 7 mm diameter. ACCESS: This access remains amenable to future percutaneous interventions as clinically indicated. Electronically Signed   By: Sandi Mariscal M.D.   On: 08/25/2018 16:23    Microbiology: Recent Results (from the past 240 hour(s))  Culture, blood (single)     Status: None   Collection Time: 08/23/18  6:52 PM  Result Value Ref Range Status   Specimen Description BLOOD LEFT EJ  Final   Special Requests   Final    BOTTLES DRAWN AEROBIC AND ANAEROBIC Blood Culture adequate volume   Culture   Final    NO GROWTH 5 DAYS Performed at [facility], [address], [place], [place] [address]    Report Status 08/28/2018 FINAL  Final  MRSA PCR Screening     Status: Abnormal   Collection Time: 08/23/18 11:15 PM  Result Value Ref Range Status   MRSA by PCR POSITIVE (A) NEGATIVE Final    Comment:        The GeneXpert MRSA Assay (FDA approved for NASAL specimens only), is one component of a comprehensive MRSA colonization surveillance program. It is not intended to diagnose MRSA infection nor to guide or monitor treatment for MRSA infections. RESULT CALLED TO, READ BACK BY AND VERIFIED WITH: Eilleen Kempf RN [id number] 08/24/18 A BROWNING Performed at [facility], [place] [address] [place], [place] [address]      Labs: Basic Metabolic Panel: Recent Labs  Lab 08/24/18 0423 08/25/18 0328 08/26/18 0446 08/27/18 0418 08/28/18 0614  NA 136 137 138 136 136  K 4.9 4.9 4.0 4.5 3.5  CL 103 102 100 98 95*  CO2 20* 21* 27 22 26   GLUCOSE 170* 121* 89 124* 142*  BUN 33* 48* 23 41* 16  CREATININE 4.66* 5.86* 3.78* 5.12* 2.85*  CALCIUM 7.6* 7.6* 7.6* 7.2* 8.3*   Liver Function Tests: Recent Labs  Lab 08/23/18 1850  AST 50*  ALT 27  ALKPHOS 132*  BILITOT 0.7  PROT 7.6  ALBUMIN 3.2*   CBC: Recent Labs  Lab 08/23/18 1850  08/24/18 0423 08/25/18 0328 08/26/18 0446 08/27/18 0418 08/28/18 0614  WBC 7.4  --  9.3 10.1 7.7 8.1 9.6  NEUTROABS 4.0   --   --   --   --   --   --   HGB 13.8   < > 12.9 11.8* 11.8* 11.3* 11.4*  HCT 43.5   < > 41.2 38.4 38.4 36.4 36.4  MCV 104.8*  --  105.1* 106.1* 105.2* 105.5* 104.0*  PLT 158  --  150 166 147* 160 172   < > = values in this interval not displayed.   Cardiac Enzymes: Recent Labs  Lab 08/23/18 2316 08/24/18 0423 08/24/18 1034  TROPONINI <0.03 <0.03 <0.03   CBG: Recent Labs  Lab 08/26/18 0805 08/27/18 0737 08/27/18 1118 08/27/18 1258 08/28/18 0801  GLUCAP 81 117* 93 129* 120*    Principal Problem:   Junctional bradycardia Active Problems:   DM type 2, uncontrolled, with renal complications (  [place])   ESRD on dialysis ([place])   Chronic diastolic (congestive) heart failure ([facility])   Paroxysmal atrial fibrillation ([facility])   Chronic respiratory failure with hypoxia ([facility])   Gout   Anxiety   Symptomatic bradycardia   SA node dysfunction ([place])   Time coordinating discharge: 45 minutes  Signed:  Murray Hodgkins, MD [facility] 08/28/2018, 1:24 PM

## 2018-08-28 NOTE — Progress Notes (Signed)
   VASCULAR SURGERY ASSESSMENT & PLAN:   1 Day Post-Op s/p: Ligation of left upper arm AV graft for venous hypertension.  Her arm swelling is improved.  Her right arm graft is working well.  She dialyzed last night.  Okay to discharge from our standpoint.  She can follow-up with me as needed.   SUBJECTIVE:   No complaints this morning.  PHYSICAL EXAM:   Vitals:   08/28/18 0342 08/28/18 0411 08/28/18 0431 08/28/18 0500  BP: (!) 156/67 (!) 179/78 (!) 162/62   Pulse: 70 68 69   Resp: 19 (!) 21 18   Temp:  97.9 F (36.6 C)    TempSrc:  Oral    SpO2: 100% 100% 100%   Weight:    71.1 kg  Height:       Good thrill in the right forearm AV graft. Swelling in the left arm is improved. The incision looks fine. She has a palpable left radial pulse.  LABS:   Lab Results  Component Value Date   WBC 9.6 08/28/2018   HGB 11.4 (L) 08/28/2018   HCT 36.4 08/28/2018   MCV 104.0 (H) 08/28/2018   PLT 172 08/28/2018   Lab Results  Component Value Date   CREATININE 5.12 (H) 08/27/2018   Lab Results  Component Value Date   INR 1.22 08/25/2018   CBG (last 3)  Recent Labs    08/27/18 0737 08/27/18 1118 08/27/18 1258  GLUCAP 117* 93 129*    PROBLEM LIST:    Principal Problem:   Junctional bradycardia Active Problems:   DM type 2, uncontrolled, with renal complications (H[facility])   ESRD on dialysis ([facility])   Chronic diastolic (congestive) heart failure (H[facility])   Paroxysmal atrial fibrillation (H[facility])   Chronic respiratory failure with hypoxia (HCC)   Gout   Anxiety   Symptomatic bradycardia   SA node dysfunction (H[facility])   CURRENT MEDS:   . allopurinol  100 mg Oral Daily  . aspirin  81 mg Oral Daily  . Chlorhexidine Gluconate Cloth  6 each Topical Q0600  . Chlorhexidine Gluconate Cloth  6 each Topical Q0600  . diazepam  5 mg Oral QHS  . dimethicone  1 application Topical BID  . dorzolamide-timolol  1 drop Left Eye BID  . doxercalciferol  1 mcg Intravenous Q M,W,F-HD  .  feeding supplement (PRO-STAT SUGAR FREE 64)  30 mL Oral BID  . heparin  5,000 Units Subcutaneous Q8H  . hydroxypropyl methylcellulose / hypromellose  1 drop Both Eyes TID  . loratadine  10 mg Oral Daily  . mouth rinse  15 mL Mouth Rinse BID  . Melatonin  6 mg Oral QHS  . midodrine  10 mg Oral Q M,W,F-HD  . multivitamin  1 tablet Oral QHS  . mupirocin ointment  1 application Nasal BID    [name] Beeper: [phone] Office: [phone] 08/28/2018

## 2018-08-30 DIAGNOSIS — R001 Bradycardia, unspecified: Secondary | ICD-10-CM | POA: Diagnosis not present

## 2018-08-30 DIAGNOSIS — E211 Secondary hyperparathyroidism, not elsewhere classified: Secondary | ICD-10-CM | POA: Diagnosis not present

## 2018-08-30 DIAGNOSIS — E1122 Type 2 diabetes mellitus with diabetic chronic kidney disease: Secondary | ICD-10-CM | POA: Diagnosis not present

## 2018-08-30 DIAGNOSIS — N2581 Secondary hyperparathyroidism of renal origin: Secondary | ICD-10-CM | POA: Diagnosis not present

## 2018-08-30 DIAGNOSIS — I953 Hypotension of hemodialysis: Secondary | ICD-10-CM | POA: Diagnosis not present

## 2018-08-30 DIAGNOSIS — Z992 Dependence on renal dialysis: Secondary | ICD-10-CM | POA: Diagnosis not present

## 2018-08-30 DIAGNOSIS — D631 Anemia in chronic kidney disease: Secondary | ICD-10-CM | POA: Diagnosis not present

## 2018-08-30 DIAGNOSIS — N186 End stage renal disease: Secondary | ICD-10-CM | POA: Diagnosis not present

## 2018-08-30 DIAGNOSIS — D509 Iron deficiency anemia, unspecified: Secondary | ICD-10-CM | POA: Diagnosis not present

## 2018-09-01 ENCOUNTER — Ambulatory Visit: Payer: Medicare Other | Admitting: Vascular Surgery

## 2018-09-01 DIAGNOSIS — N186 End stage renal disease: Secondary | ICD-10-CM | POA: Diagnosis not present

## 2018-09-01 DIAGNOSIS — D631 Anemia in chronic kidney disease: Secondary | ICD-10-CM | POA: Diagnosis not present

## 2018-09-01 DIAGNOSIS — E1122 Type 2 diabetes mellitus with diabetic chronic kidney disease: Secondary | ICD-10-CM | POA: Diagnosis not present

## 2018-09-01 DIAGNOSIS — E211 Secondary hyperparathyroidism, not elsewhere classified: Secondary | ICD-10-CM | POA: Diagnosis not present

## 2018-09-01 DIAGNOSIS — N2581 Secondary hyperparathyroidism of renal origin: Secondary | ICD-10-CM | POA: Diagnosis not present

## 2018-09-01 DIAGNOSIS — D509 Iron deficiency anemia, unspecified: Secondary | ICD-10-CM | POA: Diagnosis not present

## 2018-09-03 DIAGNOSIS — N186 End stage renal disease: Secondary | ICD-10-CM | POA: Diagnosis not present

## 2018-09-03 DIAGNOSIS — D509 Iron deficiency anemia, unspecified: Secondary | ICD-10-CM | POA: Diagnosis not present

## 2018-09-03 DIAGNOSIS — D631 Anemia in chronic kidney disease: Secondary | ICD-10-CM | POA: Diagnosis not present

## 2018-09-03 DIAGNOSIS — E1122 Type 2 diabetes mellitus with diabetic chronic kidney disease: Secondary | ICD-10-CM | POA: Diagnosis not present

## 2018-09-03 DIAGNOSIS — N2581 Secondary hyperparathyroidism of renal origin: Secondary | ICD-10-CM | POA: Diagnosis not present

## 2018-09-03 DIAGNOSIS — E211 Secondary hyperparathyroidism, not elsewhere classified: Secondary | ICD-10-CM | POA: Diagnosis not present

## 2018-09-06 DIAGNOSIS — N2581 Secondary hyperparathyroidism of renal origin: Secondary | ICD-10-CM | POA: Diagnosis not present

## 2018-09-06 DIAGNOSIS — E211 Secondary hyperparathyroidism, not elsewhere classified: Secondary | ICD-10-CM | POA: Diagnosis not present

## 2018-09-06 DIAGNOSIS — N186 End stage renal disease: Secondary | ICD-10-CM | POA: Diagnosis not present

## 2018-09-06 DIAGNOSIS — E1122 Type 2 diabetes mellitus with diabetic chronic kidney disease: Secondary | ICD-10-CM | POA: Diagnosis not present

## 2018-09-06 DIAGNOSIS — D631 Anemia in chronic kidney disease: Secondary | ICD-10-CM | POA: Diagnosis not present

## 2018-09-06 DIAGNOSIS — D509 Iron deficiency anemia, unspecified: Secondary | ICD-10-CM | POA: Diagnosis not present

## 2018-09-08 DIAGNOSIS — N2581 Secondary hyperparathyroidism of renal origin: Secondary | ICD-10-CM | POA: Diagnosis not present

## 2018-09-08 DIAGNOSIS — E1122 Type 2 diabetes mellitus with diabetic chronic kidney disease: Secondary | ICD-10-CM | POA: Diagnosis not present

## 2018-09-08 DIAGNOSIS — D509 Iron deficiency anemia, unspecified: Secondary | ICD-10-CM | POA: Diagnosis not present

## 2018-09-08 DIAGNOSIS — E211 Secondary hyperparathyroidism, not elsewhere classified: Secondary | ICD-10-CM | POA: Diagnosis not present

## 2018-09-08 DIAGNOSIS — D631 Anemia in chronic kidney disease: Secondary | ICD-10-CM | POA: Diagnosis not present

## 2018-09-08 DIAGNOSIS — N186 End stage renal disease: Secondary | ICD-10-CM | POA: Diagnosis not present

## 2018-09-10 DIAGNOSIS — N186 End stage renal disease: Secondary | ICD-10-CM | POA: Diagnosis not present

## 2018-09-10 DIAGNOSIS — E1122 Type 2 diabetes mellitus with diabetic chronic kidney disease: Secondary | ICD-10-CM | POA: Diagnosis not present

## 2018-09-10 DIAGNOSIS — N2581 Secondary hyperparathyroidism of renal origin: Secondary | ICD-10-CM | POA: Diagnosis not present

## 2018-09-10 DIAGNOSIS — D631 Anemia in chronic kidney disease: Secondary | ICD-10-CM | POA: Diagnosis not present

## 2018-09-10 DIAGNOSIS — D509 Iron deficiency anemia, unspecified: Secondary | ICD-10-CM | POA: Diagnosis not present

## 2018-09-10 DIAGNOSIS — E211 Secondary hyperparathyroidism, not elsewhere classified: Secondary | ICD-10-CM | POA: Diagnosis not present

## 2018-09-13 DIAGNOSIS — E211 Secondary hyperparathyroidism, not elsewhere classified: Secondary | ICD-10-CM | POA: Diagnosis not present

## 2018-09-13 DIAGNOSIS — D631 Anemia in chronic kidney disease: Secondary | ICD-10-CM | POA: Diagnosis not present

## 2018-09-13 DIAGNOSIS — N2581 Secondary hyperparathyroidism of renal origin: Secondary | ICD-10-CM | POA: Diagnosis not present

## 2018-09-13 DIAGNOSIS — E1122 Type 2 diabetes mellitus with diabetic chronic kidney disease: Secondary | ICD-10-CM | POA: Diagnosis not present

## 2018-09-13 DIAGNOSIS — N186 End stage renal disease: Secondary | ICD-10-CM | POA: Diagnosis not present

## 2018-09-13 DIAGNOSIS — D509 Iron deficiency anemia, unspecified: Secondary | ICD-10-CM | POA: Diagnosis not present

## 2018-09-15 DIAGNOSIS — D509 Iron deficiency anemia, unspecified: Secondary | ICD-10-CM | POA: Diagnosis not present

## 2018-09-15 DIAGNOSIS — N186 End stage renal disease: Secondary | ICD-10-CM | POA: Diagnosis not present

## 2018-09-15 DIAGNOSIS — E1122 Type 2 diabetes mellitus with diabetic chronic kidney disease: Secondary | ICD-10-CM | POA: Diagnosis not present

## 2018-09-15 DIAGNOSIS — E211 Secondary hyperparathyroidism, not elsewhere classified: Secondary | ICD-10-CM | POA: Diagnosis not present

## 2018-09-15 DIAGNOSIS — D631 Anemia in chronic kidney disease: Secondary | ICD-10-CM | POA: Diagnosis not present

## 2018-09-15 DIAGNOSIS — R110 Nausea: Secondary | ICD-10-CM | POA: Diagnosis not present

## 2018-09-15 DIAGNOSIS — N2581 Secondary hyperparathyroidism of renal origin: Secondary | ICD-10-CM | POA: Diagnosis not present

## 2018-09-17 DIAGNOSIS — E1122 Type 2 diabetes mellitus with diabetic chronic kidney disease: Secondary | ICD-10-CM | POA: Diagnosis not present

## 2018-09-17 DIAGNOSIS — D509 Iron deficiency anemia, unspecified: Secondary | ICD-10-CM | POA: Diagnosis not present

## 2018-09-17 DIAGNOSIS — N186 End stage renal disease: Secondary | ICD-10-CM | POA: Diagnosis not present

## 2018-09-17 DIAGNOSIS — D631 Anemia in chronic kidney disease: Secondary | ICD-10-CM | POA: Diagnosis not present

## 2018-09-17 DIAGNOSIS — E211 Secondary hyperparathyroidism, not elsewhere classified: Secondary | ICD-10-CM | POA: Diagnosis not present

## 2018-09-17 DIAGNOSIS — N2581 Secondary hyperparathyroidism of renal origin: Secondary | ICD-10-CM | POA: Diagnosis not present

## 2018-09-20 DIAGNOSIS — N186 End stage renal disease: Secondary | ICD-10-CM | POA: Diagnosis not present

## 2018-09-20 DIAGNOSIS — D631 Anemia in chronic kidney disease: Secondary | ICD-10-CM | POA: Diagnosis not present

## 2018-09-20 DIAGNOSIS — E211 Secondary hyperparathyroidism, not elsewhere classified: Secondary | ICD-10-CM | POA: Diagnosis not present

## 2018-09-20 DIAGNOSIS — D509 Iron deficiency anemia, unspecified: Secondary | ICD-10-CM | POA: Diagnosis not present

## 2018-09-20 DIAGNOSIS — N2581 Secondary hyperparathyroidism of renal origin: Secondary | ICD-10-CM | POA: Diagnosis not present

## 2018-09-20 DIAGNOSIS — E1122 Type 2 diabetes mellitus with diabetic chronic kidney disease: Secondary | ICD-10-CM | POA: Diagnosis not present

## 2018-09-21 DIAGNOSIS — R498 Other voice and resonance disorders: Secondary | ICD-10-CM | POA: Diagnosis not present

## 2018-09-21 DIAGNOSIS — R293 Abnormal posture: Secondary | ICD-10-CM | POA: Diagnosis not present

## 2018-09-21 DIAGNOSIS — E1129 Type 2 diabetes mellitus with other diabetic kidney complication: Secondary | ICD-10-CM | POA: Diagnosis not present

## 2018-09-21 DIAGNOSIS — I959 Hypotension, unspecified: Secondary | ICD-10-CM | POA: Diagnosis not present

## 2018-09-21 DIAGNOSIS — R41841 Cognitive communication deficit: Secondary | ICD-10-CM | POA: Diagnosis not present

## 2018-09-21 DIAGNOSIS — E1122 Type 2 diabetes mellitus with diabetic chronic kidney disease: Secondary | ICD-10-CM | POA: Diagnosis not present

## 2018-09-21 DIAGNOSIS — M6281 Muscle weakness (generalized): Secondary | ICD-10-CM | POA: Diagnosis not present

## 2018-09-21 DIAGNOSIS — J9611 Chronic respiratory failure with hypoxia: Secondary | ICD-10-CM | POA: Diagnosis not present

## 2018-09-21 DIAGNOSIS — I480 Paroxysmal atrial fibrillation: Secondary | ICD-10-CM | POA: Diagnosis not present

## 2018-09-21 DIAGNOSIS — R001 Bradycardia, unspecified: Secondary | ICD-10-CM | POA: Diagnosis not present

## 2018-09-21 DIAGNOSIS — Z992 Dependence on renal dialysis: Secondary | ICD-10-CM | POA: Diagnosis not present

## 2018-09-21 DIAGNOSIS — N186 End stage renal disease: Secondary | ICD-10-CM | POA: Diagnosis not present

## 2018-09-22 DIAGNOSIS — F419 Anxiety disorder, unspecified: Secondary | ICD-10-CM | POA: Diagnosis not present

## 2018-09-22 DIAGNOSIS — D631 Anemia in chronic kidney disease: Secondary | ICD-10-CM | POA: Diagnosis not present

## 2018-09-22 DIAGNOSIS — E1122 Type 2 diabetes mellitus with diabetic chronic kidney disease: Secondary | ICD-10-CM | POA: Diagnosis not present

## 2018-09-22 DIAGNOSIS — G4700 Insomnia, unspecified: Secondary | ICD-10-CM | POA: Diagnosis not present

## 2018-09-22 DIAGNOSIS — N186 End stage renal disease: Secondary | ICD-10-CM | POA: Diagnosis not present

## 2018-09-22 DIAGNOSIS — N2581 Secondary hyperparathyroidism of renal origin: Secondary | ICD-10-CM | POA: Diagnosis not present

## 2018-09-22 DIAGNOSIS — E211 Secondary hyperparathyroidism, not elsewhere classified: Secondary | ICD-10-CM | POA: Diagnosis not present

## 2018-09-23 ENCOUNTER — Ambulatory Visit (INDEPENDENT_AMBULATORY_CARE_PROVIDER_SITE_OTHER): Payer: Medicare Other | Admitting: Cardiovascular Disease

## 2018-09-23 ENCOUNTER — Encounter: Payer: Self-pay | Admitting: Cardiovascular Disease

## 2018-09-23 VITALS — BP 102/60 | HR 93 | Ht 65.0 in | Wt 154.0 lb

## 2018-09-23 DIAGNOSIS — I480 Paroxysmal atrial fibrillation: Secondary | ICD-10-CM | POA: Diagnosis not present

## 2018-09-23 DIAGNOSIS — I700 Atherosclerosis of aorta: Secondary | ICD-10-CM | POA: Diagnosis not present

## 2018-09-23 DIAGNOSIS — N186 End stage renal disease: Secondary | ICD-10-CM | POA: Diagnosis not present

## 2018-09-23 DIAGNOSIS — I495 Sick sinus syndrome: Secondary | ICD-10-CM | POA: Diagnosis not present

## 2018-09-23 DIAGNOSIS — E8809 Other disorders of plasma-protein metabolism, not elsewhere classified: Secondary | ICD-10-CM

## 2018-09-23 DIAGNOSIS — I953 Hypotension of hemodialysis: Secondary | ICD-10-CM

## 2018-09-23 DIAGNOSIS — R498 Other voice and resonance disorders: Secondary | ICD-10-CM | POA: Diagnosis not present

## 2018-09-23 DIAGNOSIS — E1129 Type 2 diabetes mellitus with other diabetic kidney complication: Secondary | ICD-10-CM | POA: Diagnosis not present

## 2018-09-23 DIAGNOSIS — E46 Unspecified protein-calorie malnutrition: Secondary | ICD-10-CM

## 2018-09-23 DIAGNOSIS — M6281 Muscle weakness (generalized): Secondary | ICD-10-CM | POA: Diagnosis not present

## 2018-09-23 DIAGNOSIS — I959 Hypotension, unspecified: Secondary | ICD-10-CM | POA: Diagnosis not present

## 2018-09-23 DIAGNOSIS — R001 Bradycardia, unspecified: Secondary | ICD-10-CM | POA: Diagnosis not present

## 2018-09-23 NOTE — Patient Instructions (Signed)
Dr Sallyanne Kuster recommends that you schedule a follow-up appointment in 3 months.  If you need a refill on your cardiac medications before your next appointment, please call your pharmacy.

## 2018-09-23 NOTE — Progress Notes (Signed)
Cardiology Office Note:    Date:  09/25/2018   ID:  [name] James, DOB 12-Apr-1936, MRN [id number]  PCP:  Place, [place]  Cardiologist:  Sanda Klein, MD   Referring MD: Place, [place]   chief complaint: Follow-up paroxysmal atrial fibrillation and bradycardia  History of Present Illness:    [name] James is a 82 y.o. female with a hx of Paroxysmal atrial fibrillation in the setting of acute illness , diabetes mellitus, end-stage renal disease, known aortic and coronary atherosclerosis, but with a normal nuclear stress test and normal left ventricular ejection fraction.   07/06/2018 Echo described mild LVH, LVEF 65-70%, severe left atrial size.  In July she had chest pain and junctional rhythm.  She was evaluated by EP and felt to be poor candidate for a pacemaker due to an occluded left subclavian artery hemodialysis via right arm fistula. Amiodarone and metoprolol were discontinued.  But in September, she was hospitalized with severe symptomatic junctional bradycardia and hypotension, responsive to atropine and IV fluids.  She was again evaluated by EP (a different provider) and it was felt that a pacemaker should not be implanted, for the same reasons.   She has not had either atrial fibrillation or bradycardia since discharge from the hospital in September.  She requires immediate treatment for symptomatic hypotension. She has a Rx for hydralazine if BP is too high, but rarely needs it.  She is not on anticoagulation since she had problems with GI bleeding. She has been on chronic hemodialysis (MWF, Dr. [name], now Dr. [name]) since 2015. She is a permanent resident at [facility].  Note from 08/24/2018, Dr. [name]: "I have discussed the case with Dr. [name], [name], and [name]. She has symptomatic sinus node dysfunction and a clear cut indication for a PPM. Unfortunately her access is such the left left subclavian was previously used for HD but is now occluded based on the  collaterals on her skin, a right arm that is being used for HD which is functioning poorly and will undergo revision with interventional radiology in the next few days. Placement of a PPM in her right femoral vein would be a consideration but examination of the area is particularly discouraging due to her massive obesity and multiple folds and evidence of chronic fungal infection. I strongly suspect that any insertion of a PPM from the right femoral vein would lead to an cardiac device infection with in a few months. She is quite sedentary and does not walk or transfer and lays in the bed much of the day. Her exam is accurately documented above. Other than an Epicardial PPM, we do not have an option for improving her HR other than possibly adding theophylline or encouraging her to drink more caffeine. She is approaching hospice level care".  And 08/26/2018 "She has no endovascular access sites. We cannot place an endovascular pacemaker except for a Micra device which is placed via right femoral vein into the RV apex. The placement of a single chamber PM in the RV in a patient with sinus rhythm will result in pacemaker syndrome, will make her feel worse and also make her device dependent. Placement of a standard PM via the right femoral vein will result in a device infection. The area is dirty.   Her only option for physiologic pacing would be to call CVTS and place and epicardial pacing system with an open chest procedure either with median sternotomy or thoracotomy. I feel strongly that she is not a candidate for  this procedure as well.   I would consider stopping the timolol eye drops.   In summary there are no good options for pacing this patient. As I noted, medical options that might increase her HR are theophylline. The therapeutic window for these drugs is very small however."    Past Medical History:  Diagnosis Date  . Anemia, chronic disease   . Anxiety   . Arthritis    knees  . Atrial  fibrillation ([place])   . Barrett esophagus   . Benign paroxysmal positional vertigo   . Breast cancer ([place]) 1980s   bil mastectomies, no radiation or chemo  . Cataract   . Chest pain, atypical 12/05/2008   R/Lmv- normal perfusion all regions, noe ECG changes   . CHF (congestive heart failure) ([place]) 05/13/2011   echo - HY>85%; stage 1 diastolic dysfunction; elevated LV filling pressure, MAC  . CKD (chronic kidney disease), stage III ([place])   . Claudication ([place]) 10/30/2005   doppler - normal evaluation, no evidence of aneurysm, diameter reduction, dissection, compression or vascular abnormality)  . Complication of anesthesia   . Dyslipidemia   . Dysrhythmia    Atrial Fibrillation  . Edema   . ESRD (end stage renal disease) on dialysis ([place])    "[facility]; [address]; MWF" (07/06/2018)  . Family history of adverse reaction to anesthesia    son also has nausea   . Fever blister 12/2016  . Gastritis and gastroduodenitis   . GERD (gastroesophageal reflux disease)   . Gout   . Hemodialysis-associated hypotension   . HLD (hyperlipidemia)   . Hypertension   . IBS (irritable bowel syndrome)   . Insomnia   . Morbid obesity ([place])   . On home oxygen therapy    "2L; 24/7" (07/06/2018)  . PAF (paroxysmal atrial fibrillation) ([place])   . Peripheral neuropathy   . Pneumonia 12/2016  . PONV (postoperative nausea and vomiting)   . Renal cyst   . Renal insufficiency 10/30/2005   doppler - abn resistance consistent w/ parenchymal disease  . Renovascular hypertension   . Type 2 diabetes mellitus with diabetic neuropathy, with long-term current use of insulin ([place])   . Vertigo     Past Surgical History:  Procedure Laterality Date  . A/V FISTULAGRAM Left 03/19/2018   Procedure: A/V FISTULAGRAM;  Surgeon: Angelia Mould, MD;  Location: [place] [facility];  Service: Cardiovascular;  Laterality: Left;  . A/V SHUNTOGRAM Left 01/19/2018   Procedure: A/V SHUNTOGRAM;  Surgeon: Serafina Mitchell, MD;   Location: [place] [facility];  Service: Cardiovascular;  Laterality: Left;  . ANGIOPLASTY Left 02/12/2015   Procedure: ANGIOPLASTY;  Surgeon: Angelia Mould, MD;  Location: [facility] CATH LAB;  Service: Cardiovascular;  Laterality: Left;  AVF  . AV FISTULA PLACEMENT Left 11/30/2014   Procedure: ARTERIOVENOUS (AV) FISTULA CREATION LEFT ARM;  Surgeon: Angelia Mould, MD;  Location: [place];  Service: Vascular;  Laterality: Left;  . AV FISTULA PLACEMENT Left 03/20/2015   Procedure:  Inserton of Left Upper Arm Gortex Graft;  Surgeon: Angelia Mould, MD;  Location: [place];  Service: Vascular;  Laterality: Left;  . AV FISTULA PLACEMENT Left 04/12/2015   Procedure: INSERTION OF LEFT ARM  ARTERIOVENOUS GORE-TEX GRAFT ;  Surgeon: Angelia Mould, MD;  Location: [place];  Service: Vascular;  Laterality: Left;  . AV FISTULA PLACEMENT Right 06/08/2018   Procedure: INSERTION OF ARTERIOVENOUS (AV) GORE-TEX GRAFT ARM USING 4-7MM X 45CM GORETEX GRAFT;  Surgeon: Scot Dock,  Judeth Cornfield, MD;  Location: [place];  Service: Vascular;  Laterality: Right;  . BACK SURGERY  ~1980  . CATARACT EXTRACTION Right   . CHOLECYSTECTOMY    . ESOPHAGOGASTRODUODENOSCOPY N/A 10/24/2014   Procedure: ESOPHAGOGASTRODUODENOSCOPY (EGD);  Surgeon: Jerene Bears, MD;  Location: [facility] ENDOSCOPY;  Service: Endoscopy;  Laterality: N/A;  . EXCHANGE OF A DIALYSIS CATHETER Left 11/30/2014   Procedure: EXCHANGE OF A DIALYSIS CATHETER, LEFT INTERNAL JUGULAR;  Surgeon: Angelia Mould, MD;  Location: [place];  Service: Vascular;  Laterality: Left;  . EYE SURGERY Left    transplant  . FISTULOGRAM N/A 02/12/2015   Procedure: FISTULOGRAM;  Surgeon: Angelia Mould, MD;  Location: [facility] CATH LAB;  Service: Cardiovascular;  Laterality: N/A;  . HEMORRHOID SURGERY    . IR AV DIALY SHUNT INTRO NEEDLE/INTRACATH INITIAL W/PTA/IMG RIGHT Right 08/25/2018  . IR US GUIDE VASC ACCESS RIGHT  08/25/2018  . LIGATION ARTERIOVENOUS GORTEX GRAFT Left 08/27/2018    Procedure: LIGATION ARTERIOVENOUS GORTEX GRAFT ARM;  Surgeon: Angelia Mould, MD;  Location: [place];  Service: Vascular;  Laterality: Left;  . LIGATION OF ARTERIOVENOUS  FISTULA Left 03/20/2015   Procedure: LIGATION OF ARTERIOVENOUS  FISTULA;  Surgeon: Angelia Mould, MD;  Location: [place];  Service: Vascular;  Laterality: Left;  Marland Kitchen MASTECTOMY Bilateral ~1982   bil breast reconstruction with implants  . ORIF TIBIA & FIBULA FRACTURES Left 2007   also had left non displaced malleolar fracture.   Marland Kitchen PERIPHERAL VASCULAR BALLOON ANGIOPLASTY Left 01/19/2018   Procedure: PERIPHERAL VASCULAR BALLOON ANGIOPLASTY;  Surgeon: Serafina Mitchell, MD;  Location: [place] CV LAB;  Service: Cardiovascular;  Laterality: Left;  fistula  . PERIPHERAL VASCULAR BALLOON ANGIOPLASTY Left 03/19/2018   Procedure: PERIPHERAL VASCULAR BALLOON ANGIOPLASTY;  Surgeon: Angelia Mould, MD;  Location: [place] CV LAB;  Service: Cardiovascular;  Laterality: Left;  UPPER ARM FISTULA  . REMOVAL OF GRAFT Left 04/12/2015   Procedure: REMOVAL OF LEFT ARM ARTERIOVENOUS GORE-TEX GRAFT;  Surgeon: Angelia Mould, MD;  Location: [facility] OR;  Service: Vascular;  Laterality: Left;    Current Medications: Current Meds  Medication Sig  . acetaminophen (TYLENOL) 325 MG tablet Take 650 mg by mouth every 6 (six) hours as needed for fever ("for vomiting/nausea," per [facility]).   Marland Kitchen allopurinol (ZYLOPRIM) 100 MG tablet Take 100 mg by mouth daily.  Marland Kitchen aspirin 81 MG chewable tablet Chew 81 mg by mouth daily.  . B Complex-C-Folic Acid (NEPHRO VITAMINS) 0.8 MG TABS Take 1 tablet by mouth daily.  . diazepam (VALIUM) 5 MG tablet Take 1 tablet (5 mg total) by mouth at bedtime.  Marland Kitchen DIMETHICONE, TOPICAL, 5 % CREA Apply 1 application topically See admin instructions. Place a nickel-sized amount onto buttock(s) area 2 times a day for barrier protection  . dorzolamide-timolol (COSOPT) 22.3-6.8 MG/ML ophthalmic solution Place 1 drop into the  left eye 2 (two) times daily. Wait 2-5 minutes when giving two eye meds  . hydrALAZINE (APRESOLINE) 10 MG tablet Take 5 mg by mouth 3 (three) times daily as needed (hypertension).  Marland Kitchen loperamide (IMODIUM A-D) 2 MG tablet Take 2 mg by mouth 3 (three) times daily as needed (for diarrhea).   . loratadine (CLARITIN) 10 MG tablet Take 10 mg by mouth daily.  . Melatonin 5 MG TABS Take 6 mg by mouth at bedtime.   . midodrine (PROAMATINE) 10 MG tablet Take 10 mg by mouth See admin instructions. Take 10 mg by mouth WITH MORNING MEDS AT  FACILITY on Mon/Wed/Fri- dialysis days Take 10 mg by mouth with dialysis on Mon/Wed/Fri  . OXYGEN Inhale 2 L into the lungs continuous.   Vladimir Faster Glycol-Propyl Glycol (SYSTANE ULTRA) 0.4-0.3 % SOLN Place 1 drop into both eyes 3 (three) times daily.  . promethazine (PHENERGAN) 25 MG tablet Take 1 tablet by mouth every 8 (eight) hours as needed.  . traZODone (DESYREL) 50 MG tablet Take 12.5 mg by mouth at bedtime.     Allergies:   Erythromycin; Codeine; Erythromycin base; Penicillin g; Penicillins; and Zantac [ranitidine hcl]   Social History   Socioeconomic History  . Marital status: Divorced    Spouse name: Not on file  . Number of children: Not on file  . Years of education: Not on file  . Highest education level: Not on file  Occupational History  . Not on file  Social Needs  . Financial resource strain: Not on file  . Food insecurity:    Worry: Not on file    Inability: Not on file  . Transportation needs:    Medical: Not on file    Non-medical: Not on file  Tobacco Use  . Smoking status: Never Smoker  . Smokeless tobacco: Never Used  Substance and Sexual Activity  . Alcohol use: No  . Drug use: No  . Sexual activity: Not on file  Lifestyle  . Physical activity:    Days per week: Not on file    Minutes per session: Not on file  . Stress: Not on file  Relationships  . Social connections:    Talks on phone: Not on file    Gets together: Not  on file    Attends religious service: Not on file    Active member of club or organization: Not on file    Attends meetings of clubs or organizations: Not on file    Relationship status: Not on file  Other Topics Concern  . Not on file  Social History Narrative   [facility] (10/06/17):   Originally from [place]. Has always lived in [place]. She currently lives in [place]. No bird exposure. [place] Previously traveled to [place], [place], & [place]. Previously worked in a nursery and also a Pharmacist, community.      Family History: The patient's family history includes Diabetes in her mother; Heart attack in her father; Heart disease in her brother and mother; Lung disease in her brother; Multiple myeloma in her sister; Non-Hodgkin's lymphoma in her mother; Parkinson's disease in her brother; Stroke in her father. ROS:   Please see the history of present illness.     All other systems reviewed and are negative.  EKGs/Labs/Other Studies Reviewed:    The following studies were reviewed today: Extensive review of x-rays, ECGs and notes from hospitalizations in 2015 and January-February 2018, echocardiogram, notes from nephrologist and primary care provider  EKG:  EKG is not ordered today.  The ekg 9/3 showed junctional rhythm 40 bpm.  Recent Labs: 08/23/2018: ALT 27; TSH 1.417 08/28/2018: BUN 16; Creatinine, Ser 2.85; Hemoglobin 11.4; Platelets 172; Potassium 3.5; Sodium 136  Recent Lipid Panel    Component Value Date/Time   CHOL 157 08/24/2018 0423   TRIG 147 08/24/2018 0423   HDL 32 (L) 08/24/2018 0423   CHOLHDL 4.9 08/24/2018 0423   VLDL 29 08/24/2018 0423   LDLCALC 96 08/24/2018 0423    Physical Exam:    VS:  BP 102/60 (BP Location: Left Wrist, Patient Position: Sitting,  Cuff Size: Normal)   Pulse 93   Ht _0  (1.651 m)   Wt 154 lb (69.9 kg)   BMI 25.63 kg/m     Wt Readings from Last 3 Encounters:  09/23/18 154 lb (69.9 kg)  08/28/18 156 lb 12 oz (71.1 kg)  08/09/18 152  lb (68.9 kg)     General: Alert, oriented x3, no distress, pale, appears chronically ill Head: no evidence of trauma, PERRL, EOMI, no exophtalmos or lid lag, no myxedema, no xanthelasma; normal ears, nose and oropharynx Neck: normal jugular venous pulsations and no hepatojugular reflux; brisk carotid pulses without delay and no carotid bruits Chest: clear to auscultation, no signs of consolidation by percussion or palpation, normal fremitus, symmetrical and full respiratory excursions Cardiovascular: normal position and quality of the apical impulse, regular rhythm, normal first and second heart sounds, 1/6 aortic ejection murmur, no diastolic murmurs, rubs or gallops. Occluded fistula L arm, excellent thrill/bruit R arm. Collateral veins overly L shoulder. Abdomen: no tenderness or distention, no masses by palpation, no abnormal pulsatility or arterial bruits, normal bowel sounds, no hepatosplenomegaly Extremities: no clubbing, cyanosis or edema; 2+ radial, ulnar and brachial pulses bilaterally; 2+ right femoral, posterior tibial and dorsalis pedis pulses; 2+ left femoral, posterior tibial and dorsalis pedis pulses; no subclavian or femoral bruits Neurological: grossly nonfocal Psych: Normal mood and affect   ASSESSMENT:    1. Paroxysmal atrial fibrillation (HCC)   2. SSS (sick sinus syndrome) ([place])   3. Hemodialysis-associated hypotension   4. ESRD (end stage renal disease) ([place])   5. Atherosclerosis of aorta ([place])   6. Hypoalbuminemia due to protein-calorie malnutrition ([place])    PLAN:    In order of problems listed above:  1. AFib: As far as I can tell this has not been documented since 2015. Not a good candidate for anticoagulation. 2. SSS:  With periods of symptomatic bradycardia in July and early September. Amiodarone was stopped less than 2 months ago. Hopefully the risk of bradycardia has decreased further since the last admission one month ago. Evaluated by 2 different EP  specialists who have deemed her a poor candidate for device therapy. Avoid all negative chronotropic agents. She is in the process of switching her beta blocker glaucoma eyedrops to something else.  3. Hypotension: on midodrine as needed (always on HD days). 4. ESRD: On dialysis MWF. Occluded central L arm venous system and ligated L arm AVF. Dialysis via R arm. 5. Ao atherosclerosis: Has evidence of peripheral arterial disease as well as coronary calcification on chest CT, thankfully asymptomatic(except for chest pain in July, during arrhythmia). Had a normal nuclear stress test in 2015. LDL<100. Without meds. Mildly abnormal LFTs on Sept admission. 6. Hypoalbuminemia: Poor prognostic feature, but levels are improving.  Medication Adjustments/Labs and Tests Ordered: Current medicines are reviewed at length with the patient today.  Concerns regarding medicines are outlined above.  No orders of the defined types were placed in this encounter.  No orders of the defined types were placed in this encounter.   Signed, Sanda Klein, MD  09/25/2018 11:00 AM    [facility]

## 2018-09-24 DIAGNOSIS — D631 Anemia in chronic kidney disease: Secondary | ICD-10-CM | POA: Diagnosis not present

## 2018-09-24 DIAGNOSIS — N2581 Secondary hyperparathyroidism of renal origin: Secondary | ICD-10-CM | POA: Diagnosis not present

## 2018-09-24 DIAGNOSIS — E211 Secondary hyperparathyroidism, not elsewhere classified: Secondary | ICD-10-CM | POA: Diagnosis not present

## 2018-09-24 DIAGNOSIS — E1122 Type 2 diabetes mellitus with diabetic chronic kidney disease: Secondary | ICD-10-CM | POA: Diagnosis not present

## 2018-09-24 DIAGNOSIS — N186 End stage renal disease: Secondary | ICD-10-CM | POA: Diagnosis not present

## 2018-09-27 DIAGNOSIS — D631 Anemia in chronic kidney disease: Secondary | ICD-10-CM | POA: Diagnosis not present

## 2018-09-27 DIAGNOSIS — E1122 Type 2 diabetes mellitus with diabetic chronic kidney disease: Secondary | ICD-10-CM | POA: Diagnosis not present

## 2018-09-27 DIAGNOSIS — N186 End stage renal disease: Secondary | ICD-10-CM | POA: Diagnosis not present

## 2018-09-27 DIAGNOSIS — E211 Secondary hyperparathyroidism, not elsewhere classified: Secondary | ICD-10-CM | POA: Diagnosis not present

## 2018-09-27 DIAGNOSIS — N2581 Secondary hyperparathyroidism of renal origin: Secondary | ICD-10-CM | POA: Diagnosis not present

## 2018-09-28 DIAGNOSIS — R498 Other voice and resonance disorders: Secondary | ICD-10-CM | POA: Diagnosis not present

## 2018-09-28 DIAGNOSIS — M6281 Muscle weakness (generalized): Secondary | ICD-10-CM | POA: Diagnosis not present

## 2018-09-28 DIAGNOSIS — R001 Bradycardia, unspecified: Secondary | ICD-10-CM | POA: Diagnosis not present

## 2018-09-28 DIAGNOSIS — Z9981 Dependence on supplemental oxygen: Secondary | ICD-10-CM | POA: Diagnosis not present

## 2018-09-28 DIAGNOSIS — N186 End stage renal disease: Secondary | ICD-10-CM | POA: Diagnosis not present

## 2018-09-28 DIAGNOSIS — I480 Paroxysmal atrial fibrillation: Secondary | ICD-10-CM | POA: Diagnosis not present

## 2018-09-28 DIAGNOSIS — I953 Hypotension of hemodialysis: Secondary | ICD-10-CM | POA: Diagnosis not present

## 2018-09-28 DIAGNOSIS — I959 Hypotension, unspecified: Secondary | ICD-10-CM | POA: Diagnosis not present

## 2018-09-28 DIAGNOSIS — E1129 Type 2 diabetes mellitus with other diabetic kidney complication: Secondary | ICD-10-CM | POA: Diagnosis not present

## 2018-09-29 ENCOUNTER — Telehealth: Payer: Self-pay | Admitting: Internal Medicine

## 2018-09-29 DIAGNOSIS — N2581 Secondary hyperparathyroidism of renal origin: Secondary | ICD-10-CM | POA: Diagnosis not present

## 2018-09-29 DIAGNOSIS — F419 Anxiety disorder, unspecified: Secondary | ICD-10-CM | POA: Diagnosis not present

## 2018-09-29 DIAGNOSIS — G4700 Insomnia, unspecified: Secondary | ICD-10-CM | POA: Diagnosis not present

## 2018-09-29 DIAGNOSIS — N186 End stage renal disease: Secondary | ICD-10-CM | POA: Diagnosis not present

## 2018-09-29 DIAGNOSIS — E211 Secondary hyperparathyroidism, not elsewhere classified: Secondary | ICD-10-CM | POA: Diagnosis not present

## 2018-09-29 DIAGNOSIS — E1122 Type 2 diabetes mellitus with diabetic chronic kidney disease: Secondary | ICD-10-CM | POA: Diagnosis not present

## 2018-09-29 DIAGNOSIS — D631 Anemia in chronic kidney disease: Secondary | ICD-10-CM | POA: Diagnosis not present

## 2018-09-29 NOTE — Telephone Encounter (Signed)
Order faxed to [facility] for pt to have Imodium 2mg  po every 4-6 hours as needed.

## 2018-09-30 DIAGNOSIS — R001 Bradycardia, unspecified: Secondary | ICD-10-CM | POA: Diagnosis not present

## 2018-09-30 DIAGNOSIS — I480 Paroxysmal atrial fibrillation: Secondary | ICD-10-CM | POA: Diagnosis not present

## 2018-09-30 DIAGNOSIS — E1129 Type 2 diabetes mellitus with other diabetic kidney complication: Secondary | ICD-10-CM | POA: Diagnosis not present

## 2018-09-30 DIAGNOSIS — I959 Hypotension, unspecified: Secondary | ICD-10-CM | POA: Diagnosis not present

## 2018-09-30 DIAGNOSIS — R498 Other voice and resonance disorders: Secondary | ICD-10-CM | POA: Diagnosis not present

## 2018-09-30 DIAGNOSIS — M6281 Muscle weakness (generalized): Secondary | ICD-10-CM | POA: Diagnosis not present

## 2018-10-01 DIAGNOSIS — E1122 Type 2 diabetes mellitus with diabetic chronic kidney disease: Secondary | ICD-10-CM | POA: Diagnosis not present

## 2018-10-01 DIAGNOSIS — D631 Anemia in chronic kidney disease: Secondary | ICD-10-CM | POA: Diagnosis not present

## 2018-10-01 DIAGNOSIS — E211 Secondary hyperparathyroidism, not elsewhere classified: Secondary | ICD-10-CM | POA: Diagnosis not present

## 2018-10-01 DIAGNOSIS — N2581 Secondary hyperparathyroidism of renal origin: Secondary | ICD-10-CM | POA: Diagnosis not present

## 2018-10-01 DIAGNOSIS — N186 End stage renal disease: Secondary | ICD-10-CM | POA: Diagnosis not present

## 2018-10-04 DIAGNOSIS — E1122 Type 2 diabetes mellitus with diabetic chronic kidney disease: Secondary | ICD-10-CM | POA: Diagnosis not present

## 2018-10-04 DIAGNOSIS — N186 End stage renal disease: Secondary | ICD-10-CM | POA: Diagnosis not present

## 2018-10-04 DIAGNOSIS — D631 Anemia in chronic kidney disease: Secondary | ICD-10-CM | POA: Diagnosis not present

## 2018-10-04 DIAGNOSIS — E211 Secondary hyperparathyroidism, not elsewhere classified: Secondary | ICD-10-CM | POA: Diagnosis not present

## 2018-10-04 DIAGNOSIS — N2581 Secondary hyperparathyroidism of renal origin: Secondary | ICD-10-CM | POA: Diagnosis not present

## 2018-10-05 DIAGNOSIS — I480 Paroxysmal atrial fibrillation: Secondary | ICD-10-CM | POA: Diagnosis not present

## 2018-10-05 DIAGNOSIS — R498 Other voice and resonance disorders: Secondary | ICD-10-CM | POA: Diagnosis not present

## 2018-10-05 DIAGNOSIS — E1129 Type 2 diabetes mellitus with other diabetic kidney complication: Secondary | ICD-10-CM | POA: Diagnosis not present

## 2018-10-05 DIAGNOSIS — M6281 Muscle weakness (generalized): Secondary | ICD-10-CM | POA: Diagnosis not present

## 2018-10-05 DIAGNOSIS — I959 Hypotension, unspecified: Secondary | ICD-10-CM | POA: Diagnosis not present

## 2018-10-05 DIAGNOSIS — R001 Bradycardia, unspecified: Secondary | ICD-10-CM | POA: Diagnosis not present

## 2018-10-06 DIAGNOSIS — E1122 Type 2 diabetes mellitus with diabetic chronic kidney disease: Secondary | ICD-10-CM | POA: Diagnosis not present

## 2018-10-06 DIAGNOSIS — N2581 Secondary hyperparathyroidism of renal origin: Secondary | ICD-10-CM | POA: Diagnosis not present

## 2018-10-06 DIAGNOSIS — E211 Secondary hyperparathyroidism, not elsewhere classified: Secondary | ICD-10-CM | POA: Diagnosis not present

## 2018-10-06 DIAGNOSIS — N186 End stage renal disease: Secondary | ICD-10-CM | POA: Diagnosis not present

## 2018-10-06 DIAGNOSIS — D631 Anemia in chronic kidney disease: Secondary | ICD-10-CM | POA: Diagnosis not present

## 2018-10-08 DIAGNOSIS — N186 End stage renal disease: Secondary | ICD-10-CM | POA: Diagnosis not present

## 2018-10-08 DIAGNOSIS — E1122 Type 2 diabetes mellitus with diabetic chronic kidney disease: Secondary | ICD-10-CM | POA: Diagnosis not present

## 2018-10-08 DIAGNOSIS — I480 Paroxysmal atrial fibrillation: Secondary | ICD-10-CM | POA: Diagnosis not present

## 2018-10-08 DIAGNOSIS — E1129 Type 2 diabetes mellitus with other diabetic kidney complication: Secondary | ICD-10-CM | POA: Diagnosis not present

## 2018-10-08 DIAGNOSIS — R498 Other voice and resonance disorders: Secondary | ICD-10-CM | POA: Diagnosis not present

## 2018-10-08 DIAGNOSIS — E211 Secondary hyperparathyroidism, not elsewhere classified: Secondary | ICD-10-CM | POA: Diagnosis not present

## 2018-10-08 DIAGNOSIS — N2581 Secondary hyperparathyroidism of renal origin: Secondary | ICD-10-CM | POA: Diagnosis not present

## 2018-10-08 DIAGNOSIS — M6281 Muscle weakness (generalized): Secondary | ICD-10-CM | POA: Diagnosis not present

## 2018-10-08 DIAGNOSIS — D631 Anemia in chronic kidney disease: Secondary | ICD-10-CM | POA: Diagnosis not present

## 2018-10-08 DIAGNOSIS — I959 Hypotension, unspecified: Secondary | ICD-10-CM | POA: Diagnosis not present

## 2018-10-08 DIAGNOSIS — R001 Bradycardia, unspecified: Secondary | ICD-10-CM | POA: Diagnosis not present

## 2018-10-11 DIAGNOSIS — E1122 Type 2 diabetes mellitus with diabetic chronic kidney disease: Secondary | ICD-10-CM | POA: Diagnosis not present

## 2018-10-11 DIAGNOSIS — E211 Secondary hyperparathyroidism, not elsewhere classified: Secondary | ICD-10-CM | POA: Diagnosis not present

## 2018-10-11 DIAGNOSIS — N2581 Secondary hyperparathyroidism of renal origin: Secondary | ICD-10-CM | POA: Diagnosis not present

## 2018-10-11 DIAGNOSIS — N186 End stage renal disease: Secondary | ICD-10-CM | POA: Diagnosis not present

## 2018-10-11 DIAGNOSIS — D631 Anemia in chronic kidney disease: Secondary | ICD-10-CM | POA: Diagnosis not present

## 2018-10-12 DIAGNOSIS — E1129 Type 2 diabetes mellitus with other diabetic kidney complication: Secondary | ICD-10-CM | POA: Diagnosis not present

## 2018-10-12 DIAGNOSIS — I480 Paroxysmal atrial fibrillation: Secondary | ICD-10-CM | POA: Diagnosis not present

## 2018-10-12 DIAGNOSIS — I959 Hypotension, unspecified: Secondary | ICD-10-CM | POA: Diagnosis not present

## 2018-10-12 DIAGNOSIS — M6281 Muscle weakness (generalized): Secondary | ICD-10-CM | POA: Diagnosis not present

## 2018-10-12 DIAGNOSIS — R498 Other voice and resonance disorders: Secondary | ICD-10-CM | POA: Diagnosis not present

## 2018-10-12 DIAGNOSIS — R001 Bradycardia, unspecified: Secondary | ICD-10-CM | POA: Diagnosis not present

## 2018-10-13 DIAGNOSIS — N2581 Secondary hyperparathyroidism of renal origin: Secondary | ICD-10-CM | POA: Diagnosis not present

## 2018-10-13 DIAGNOSIS — E1122 Type 2 diabetes mellitus with diabetic chronic kidney disease: Secondary | ICD-10-CM | POA: Diagnosis not present

## 2018-10-13 DIAGNOSIS — F419 Anxiety disorder, unspecified: Secondary | ICD-10-CM | POA: Diagnosis not present

## 2018-10-13 DIAGNOSIS — N186 End stage renal disease: Secondary | ICD-10-CM | POA: Diagnosis not present

## 2018-10-13 DIAGNOSIS — G4700 Insomnia, unspecified: Secondary | ICD-10-CM | POA: Diagnosis not present

## 2018-10-13 DIAGNOSIS — D631 Anemia in chronic kidney disease: Secondary | ICD-10-CM | POA: Diagnosis not present

## 2018-10-13 DIAGNOSIS — E211 Secondary hyperparathyroidism, not elsewhere classified: Secondary | ICD-10-CM | POA: Diagnosis not present

## 2018-10-14 DIAGNOSIS — R001 Bradycardia, unspecified: Secondary | ICD-10-CM | POA: Diagnosis not present

## 2018-10-14 DIAGNOSIS — I959 Hypotension, unspecified: Secondary | ICD-10-CM | POA: Diagnosis not present

## 2018-10-14 DIAGNOSIS — I480 Paroxysmal atrial fibrillation: Secondary | ICD-10-CM | POA: Diagnosis not present

## 2018-10-14 DIAGNOSIS — R498 Other voice and resonance disorders: Secondary | ICD-10-CM | POA: Diagnosis not present

## 2018-10-14 DIAGNOSIS — E1129 Type 2 diabetes mellitus with other diabetic kidney complication: Secondary | ICD-10-CM | POA: Diagnosis not present

## 2018-10-14 DIAGNOSIS — M6281 Muscle weakness (generalized): Secondary | ICD-10-CM | POA: Diagnosis not present

## 2018-10-15 DIAGNOSIS — E1122 Type 2 diabetes mellitus with diabetic chronic kidney disease: Secondary | ICD-10-CM | POA: Diagnosis not present

## 2018-10-15 DIAGNOSIS — D631 Anemia in chronic kidney disease: Secondary | ICD-10-CM | POA: Diagnosis not present

## 2018-10-15 DIAGNOSIS — N186 End stage renal disease: Secondary | ICD-10-CM | POA: Diagnosis not present

## 2018-10-15 DIAGNOSIS — E211 Secondary hyperparathyroidism, not elsewhere classified: Secondary | ICD-10-CM | POA: Diagnosis not present

## 2018-10-15 DIAGNOSIS — N2581 Secondary hyperparathyroidism of renal origin: Secondary | ICD-10-CM | POA: Diagnosis not present

## 2018-10-18 DIAGNOSIS — E211 Secondary hyperparathyroidism, not elsewhere classified: Secondary | ICD-10-CM | POA: Diagnosis not present

## 2018-10-18 DIAGNOSIS — D631 Anemia in chronic kidney disease: Secondary | ICD-10-CM | POA: Diagnosis not present

## 2018-10-18 DIAGNOSIS — N2581 Secondary hyperparathyroidism of renal origin: Secondary | ICD-10-CM | POA: Diagnosis not present

## 2018-10-18 DIAGNOSIS — N186 End stage renal disease: Secondary | ICD-10-CM | POA: Diagnosis not present

## 2018-10-18 DIAGNOSIS — E1122 Type 2 diabetes mellitus with diabetic chronic kidney disease: Secondary | ICD-10-CM | POA: Diagnosis not present

## 2018-10-19 DIAGNOSIS — H401132 Primary open-angle glaucoma, bilateral, moderate stage: Secondary | ICD-10-CM | POA: Diagnosis not present

## 2018-10-20 DIAGNOSIS — D631 Anemia in chronic kidney disease: Secondary | ICD-10-CM | POA: Diagnosis not present

## 2018-10-20 DIAGNOSIS — N186 End stage renal disease: Secondary | ICD-10-CM | POA: Diagnosis not present

## 2018-10-20 DIAGNOSIS — E211 Secondary hyperparathyroidism, not elsewhere classified: Secondary | ICD-10-CM | POA: Diagnosis not present

## 2018-10-20 DIAGNOSIS — E1122 Type 2 diabetes mellitus with diabetic chronic kidney disease: Secondary | ICD-10-CM | POA: Diagnosis not present

## 2018-10-20 DIAGNOSIS — N2581 Secondary hyperparathyroidism of renal origin: Secondary | ICD-10-CM | POA: Diagnosis not present

## 2018-10-21 DIAGNOSIS — R001 Bradycardia, unspecified: Secondary | ICD-10-CM | POA: Diagnosis not present

## 2018-10-21 DIAGNOSIS — I480 Paroxysmal atrial fibrillation: Secondary | ICD-10-CM | POA: Diagnosis not present

## 2018-10-21 DIAGNOSIS — M6281 Muscle weakness (generalized): Secondary | ICD-10-CM | POA: Diagnosis not present

## 2018-10-21 DIAGNOSIS — I959 Hypotension, unspecified: Secondary | ICD-10-CM | POA: Diagnosis not present

## 2018-10-21 DIAGNOSIS — E1129 Type 2 diabetes mellitus with other diabetic kidney complication: Secondary | ICD-10-CM | POA: Diagnosis not present

## 2018-10-21 DIAGNOSIS — R498 Other voice and resonance disorders: Secondary | ICD-10-CM | POA: Diagnosis not present

## 2018-10-22 DIAGNOSIS — I4891 Unspecified atrial fibrillation: Secondary | ICD-10-CM | POA: Diagnosis not present

## 2018-10-22 DIAGNOSIS — D631 Anemia in chronic kidney disease: Secondary | ICD-10-CM | POA: Diagnosis not present

## 2018-10-22 DIAGNOSIS — K219 Gastro-esophageal reflux disease without esophagitis: Secondary | ICD-10-CM | POA: Diagnosis not present

## 2018-10-22 DIAGNOSIS — D509 Iron deficiency anemia, unspecified: Secondary | ICD-10-CM | POA: Diagnosis not present

## 2018-10-22 DIAGNOSIS — F419 Anxiety disorder, unspecified: Secondary | ICD-10-CM | POA: Diagnosis not present

## 2018-10-22 DIAGNOSIS — Z23 Encounter for immunization: Secondary | ICD-10-CM | POA: Diagnosis not present

## 2018-10-22 DIAGNOSIS — E1122 Type 2 diabetes mellitus with diabetic chronic kidney disease: Secondary | ICD-10-CM | POA: Diagnosis not present

## 2018-10-22 DIAGNOSIS — G4700 Insomnia, unspecified: Secondary | ICD-10-CM | POA: Diagnosis not present

## 2018-10-22 DIAGNOSIS — Z992 Dependence on renal dialysis: Secondary | ICD-10-CM | POA: Diagnosis not present

## 2018-10-22 DIAGNOSIS — E211 Secondary hyperparathyroidism, not elsewhere classified: Secondary | ICD-10-CM | POA: Diagnosis not present

## 2018-10-22 DIAGNOSIS — N2581 Secondary hyperparathyroidism of renal origin: Secondary | ICD-10-CM | POA: Diagnosis not present

## 2018-10-22 DIAGNOSIS — N186 End stage renal disease: Secondary | ICD-10-CM | POA: Diagnosis not present

## 2018-10-25 DIAGNOSIS — E1122 Type 2 diabetes mellitus with diabetic chronic kidney disease: Secondary | ICD-10-CM | POA: Diagnosis not present

## 2018-10-25 DIAGNOSIS — Z23 Encounter for immunization: Secondary | ICD-10-CM | POA: Diagnosis not present

## 2018-10-25 DIAGNOSIS — I4891 Unspecified atrial fibrillation: Secondary | ICD-10-CM | POA: Diagnosis not present

## 2018-10-25 DIAGNOSIS — N186 End stage renal disease: Secondary | ICD-10-CM | POA: Diagnosis not present

## 2018-10-25 DIAGNOSIS — D509 Iron deficiency anemia, unspecified: Secondary | ICD-10-CM | POA: Diagnosis not present

## 2018-10-25 DIAGNOSIS — E211 Secondary hyperparathyroidism, not elsewhere classified: Secondary | ICD-10-CM | POA: Diagnosis not present

## 2018-10-25 DIAGNOSIS — D631 Anemia in chronic kidney disease: Secondary | ICD-10-CM | POA: Diagnosis not present

## 2018-10-25 DIAGNOSIS — K219 Gastro-esophageal reflux disease without esophagitis: Secondary | ICD-10-CM | POA: Diagnosis not present

## 2018-10-25 DIAGNOSIS — N2581 Secondary hyperparathyroidism of renal origin: Secondary | ICD-10-CM | POA: Diagnosis not present

## 2018-10-26 DIAGNOSIS — R001 Bradycardia, unspecified: Secondary | ICD-10-CM | POA: Diagnosis not present

## 2018-10-26 DIAGNOSIS — I480 Paroxysmal atrial fibrillation: Secondary | ICD-10-CM | POA: Diagnosis not present

## 2018-10-26 DIAGNOSIS — R498 Other voice and resonance disorders: Secondary | ICD-10-CM | POA: Diagnosis not present

## 2018-10-26 DIAGNOSIS — R293 Abnormal posture: Secondary | ICD-10-CM | POA: Diagnosis not present

## 2018-10-26 DIAGNOSIS — M6281 Muscle weakness (generalized): Secondary | ICD-10-CM | POA: Diagnosis not present

## 2018-10-26 DIAGNOSIS — J9611 Chronic respiratory failure with hypoxia: Secondary | ICD-10-CM | POA: Diagnosis not present

## 2018-10-26 DIAGNOSIS — E1129 Type 2 diabetes mellitus with other diabetic kidney complication: Secondary | ICD-10-CM | POA: Diagnosis not present

## 2018-10-26 DIAGNOSIS — I959 Hypotension, unspecified: Secondary | ICD-10-CM | POA: Diagnosis not present

## 2018-10-26 DIAGNOSIS — R41841 Cognitive communication deficit: Secondary | ICD-10-CM | POA: Diagnosis not present

## 2018-10-27 DIAGNOSIS — N186 End stage renal disease: Secondary | ICD-10-CM | POA: Diagnosis not present

## 2018-10-27 DIAGNOSIS — E211 Secondary hyperparathyroidism, not elsewhere classified: Secondary | ICD-10-CM | POA: Diagnosis not present

## 2018-10-27 DIAGNOSIS — Z23 Encounter for immunization: Secondary | ICD-10-CM | POA: Diagnosis not present

## 2018-10-27 DIAGNOSIS — E1122 Type 2 diabetes mellitus with diabetic chronic kidney disease: Secondary | ICD-10-CM | POA: Diagnosis not present

## 2018-10-27 DIAGNOSIS — D509 Iron deficiency anemia, unspecified: Secondary | ICD-10-CM | POA: Diagnosis not present

## 2018-10-27 DIAGNOSIS — N2581 Secondary hyperparathyroidism of renal origin: Secondary | ICD-10-CM | POA: Diagnosis not present

## 2018-10-28 DIAGNOSIS — E1129 Type 2 diabetes mellitus with other diabetic kidney complication: Secondary | ICD-10-CM | POA: Diagnosis not present

## 2018-10-28 DIAGNOSIS — I959 Hypotension, unspecified: Secondary | ICD-10-CM | POA: Diagnosis not present

## 2018-10-28 DIAGNOSIS — R498 Other voice and resonance disorders: Secondary | ICD-10-CM | POA: Diagnosis not present

## 2018-10-28 DIAGNOSIS — I480 Paroxysmal atrial fibrillation: Secondary | ICD-10-CM | POA: Diagnosis not present

## 2018-10-28 DIAGNOSIS — R001 Bradycardia, unspecified: Secondary | ICD-10-CM | POA: Diagnosis not present

## 2018-10-28 DIAGNOSIS — M6281 Muscle weakness (generalized): Secondary | ICD-10-CM | POA: Diagnosis not present

## 2018-10-29 DIAGNOSIS — E1122 Type 2 diabetes mellitus with diabetic chronic kidney disease: Secondary | ICD-10-CM | POA: Diagnosis not present

## 2018-10-29 DIAGNOSIS — N186 End stage renal disease: Secondary | ICD-10-CM | POA: Diagnosis not present

## 2018-10-29 DIAGNOSIS — D509 Iron deficiency anemia, unspecified: Secondary | ICD-10-CM | POA: Diagnosis not present

## 2018-10-29 DIAGNOSIS — N2581 Secondary hyperparathyroidism of renal origin: Secondary | ICD-10-CM | POA: Diagnosis not present

## 2018-10-29 DIAGNOSIS — E211 Secondary hyperparathyroidism, not elsewhere classified: Secondary | ICD-10-CM | POA: Diagnosis not present

## 2018-10-29 DIAGNOSIS — Z23 Encounter for immunization: Secondary | ICD-10-CM | POA: Diagnosis not present

## 2018-11-01 DIAGNOSIS — Z23 Encounter for immunization: Secondary | ICD-10-CM | POA: Diagnosis not present

## 2018-11-01 DIAGNOSIS — E1122 Type 2 diabetes mellitus with diabetic chronic kidney disease: Secondary | ICD-10-CM | POA: Diagnosis not present

## 2018-11-01 DIAGNOSIS — E211 Secondary hyperparathyroidism, not elsewhere classified: Secondary | ICD-10-CM | POA: Diagnosis not present

## 2018-11-01 DIAGNOSIS — N2581 Secondary hyperparathyroidism of renal origin: Secondary | ICD-10-CM | POA: Diagnosis not present

## 2018-11-01 DIAGNOSIS — N186 End stage renal disease: Secondary | ICD-10-CM | POA: Diagnosis not present

## 2018-11-01 DIAGNOSIS — D631 Anemia in chronic kidney disease: Secondary | ICD-10-CM | POA: Diagnosis not present

## 2018-11-01 DIAGNOSIS — I959 Hypotension, unspecified: Secondary | ICD-10-CM | POA: Diagnosis not present

## 2018-11-01 DIAGNOSIS — D509 Iron deficiency anemia, unspecified: Secondary | ICD-10-CM | POA: Diagnosis not present

## 2018-11-01 DIAGNOSIS — Z992 Dependence on renal dialysis: Secondary | ICD-10-CM | POA: Diagnosis not present

## 2018-11-02 DIAGNOSIS — I959 Hypotension, unspecified: Secondary | ICD-10-CM | POA: Diagnosis not present

## 2018-11-02 DIAGNOSIS — M6281 Muscle weakness (generalized): Secondary | ICD-10-CM | POA: Diagnosis not present

## 2018-11-02 DIAGNOSIS — E1129 Type 2 diabetes mellitus with other diabetic kidney complication: Secondary | ICD-10-CM | POA: Diagnosis not present

## 2018-11-02 DIAGNOSIS — R001 Bradycardia, unspecified: Secondary | ICD-10-CM | POA: Diagnosis not present

## 2018-11-02 DIAGNOSIS — R498 Other voice and resonance disorders: Secondary | ICD-10-CM | POA: Diagnosis not present

## 2018-11-02 DIAGNOSIS — I480 Paroxysmal atrial fibrillation: Secondary | ICD-10-CM | POA: Diagnosis not present

## 2018-11-03 DIAGNOSIS — N186 End stage renal disease: Secondary | ICD-10-CM | POA: Diagnosis not present

## 2018-11-03 DIAGNOSIS — D509 Iron deficiency anemia, unspecified: Secondary | ICD-10-CM | POA: Diagnosis not present

## 2018-11-03 DIAGNOSIS — N2581 Secondary hyperparathyroidism of renal origin: Secondary | ICD-10-CM | POA: Diagnosis not present

## 2018-11-03 DIAGNOSIS — Z23 Encounter for immunization: Secondary | ICD-10-CM | POA: Diagnosis not present

## 2018-11-03 DIAGNOSIS — G4700 Insomnia, unspecified: Secondary | ICD-10-CM | POA: Diagnosis not present

## 2018-11-03 DIAGNOSIS — E1122 Type 2 diabetes mellitus with diabetic chronic kidney disease: Secondary | ICD-10-CM | POA: Diagnosis not present

## 2018-11-03 DIAGNOSIS — F419 Anxiety disorder, unspecified: Secondary | ICD-10-CM | POA: Diagnosis not present

## 2018-11-03 DIAGNOSIS — E211 Secondary hyperparathyroidism, not elsewhere classified: Secondary | ICD-10-CM | POA: Diagnosis not present

## 2018-11-04 DIAGNOSIS — I959 Hypotension, unspecified: Secondary | ICD-10-CM | POA: Diagnosis not present

## 2018-11-04 DIAGNOSIS — I480 Paroxysmal atrial fibrillation: Secondary | ICD-10-CM | POA: Diagnosis not present

## 2018-11-04 DIAGNOSIS — M6281 Muscle weakness (generalized): Secondary | ICD-10-CM | POA: Diagnosis not present

## 2018-11-04 DIAGNOSIS — R001 Bradycardia, unspecified: Secondary | ICD-10-CM | POA: Diagnosis not present

## 2018-11-04 DIAGNOSIS — R498 Other voice and resonance disorders: Secondary | ICD-10-CM | POA: Diagnosis not present

## 2018-11-04 DIAGNOSIS — E1129 Type 2 diabetes mellitus with other diabetic kidney complication: Secondary | ICD-10-CM | POA: Diagnosis not present

## 2018-11-05 DIAGNOSIS — E1122 Type 2 diabetes mellitus with diabetic chronic kidney disease: Secondary | ICD-10-CM | POA: Diagnosis not present

## 2018-11-05 DIAGNOSIS — Z23 Encounter for immunization: Secondary | ICD-10-CM | POA: Diagnosis not present

## 2018-11-05 DIAGNOSIS — D509 Iron deficiency anemia, unspecified: Secondary | ICD-10-CM | POA: Diagnosis not present

## 2018-11-05 DIAGNOSIS — N2581 Secondary hyperparathyroidism of renal origin: Secondary | ICD-10-CM | POA: Diagnosis not present

## 2018-11-05 DIAGNOSIS — E211 Secondary hyperparathyroidism, not elsewhere classified: Secondary | ICD-10-CM | POA: Diagnosis not present

## 2018-11-05 DIAGNOSIS — N186 End stage renal disease: Secondary | ICD-10-CM | POA: Diagnosis not present

## 2018-11-08 DIAGNOSIS — N186 End stage renal disease: Secondary | ICD-10-CM | POA: Diagnosis not present

## 2018-11-08 DIAGNOSIS — E211 Secondary hyperparathyroidism, not elsewhere classified: Secondary | ICD-10-CM | POA: Diagnosis not present

## 2018-11-08 DIAGNOSIS — N2581 Secondary hyperparathyroidism of renal origin: Secondary | ICD-10-CM | POA: Diagnosis not present

## 2018-11-08 DIAGNOSIS — Z23 Encounter for immunization: Secondary | ICD-10-CM | POA: Diagnosis not present

## 2018-11-08 DIAGNOSIS — D509 Iron deficiency anemia, unspecified: Secondary | ICD-10-CM | POA: Diagnosis not present

## 2018-11-08 DIAGNOSIS — E1122 Type 2 diabetes mellitus with diabetic chronic kidney disease: Secondary | ICD-10-CM | POA: Diagnosis not present

## 2018-11-09 DIAGNOSIS — E1129 Type 2 diabetes mellitus with other diabetic kidney complication: Secondary | ICD-10-CM | POA: Diagnosis not present

## 2018-11-09 DIAGNOSIS — M6281 Muscle weakness (generalized): Secondary | ICD-10-CM | POA: Diagnosis not present

## 2018-11-09 DIAGNOSIS — R001 Bradycardia, unspecified: Secondary | ICD-10-CM | POA: Diagnosis not present

## 2018-11-09 DIAGNOSIS — I480 Paroxysmal atrial fibrillation: Secondary | ICD-10-CM | POA: Diagnosis not present

## 2018-11-09 DIAGNOSIS — R498 Other voice and resonance disorders: Secondary | ICD-10-CM | POA: Diagnosis not present

## 2018-11-09 DIAGNOSIS — I959 Hypotension, unspecified: Secondary | ICD-10-CM | POA: Diagnosis not present

## 2018-11-10 DIAGNOSIS — Z23 Encounter for immunization: Secondary | ICD-10-CM | POA: Diagnosis not present

## 2018-11-10 DIAGNOSIS — E1122 Type 2 diabetes mellitus with diabetic chronic kidney disease: Secondary | ICD-10-CM | POA: Diagnosis not present

## 2018-11-10 DIAGNOSIS — N2581 Secondary hyperparathyroidism of renal origin: Secondary | ICD-10-CM | POA: Diagnosis not present

## 2018-11-10 DIAGNOSIS — E211 Secondary hyperparathyroidism, not elsewhere classified: Secondary | ICD-10-CM | POA: Diagnosis not present

## 2018-11-10 DIAGNOSIS — N186 End stage renal disease: Secondary | ICD-10-CM | POA: Diagnosis not present

## 2018-11-10 DIAGNOSIS — D509 Iron deficiency anemia, unspecified: Secondary | ICD-10-CM | POA: Diagnosis not present

## 2018-11-11 ENCOUNTER — Other Ambulatory Visit (HOSPITAL_COMMUNITY): Payer: Self-pay | Admitting: Nephrology

## 2018-11-11 DIAGNOSIS — E1129 Type 2 diabetes mellitus with other diabetic kidney complication: Secondary | ICD-10-CM | POA: Diagnosis not present

## 2018-11-11 DIAGNOSIS — N186 End stage renal disease: Secondary | ICD-10-CM

## 2018-11-11 DIAGNOSIS — Z992 Dependence on renal dialysis: Principal | ICD-10-CM

## 2018-11-11 DIAGNOSIS — I959 Hypotension, unspecified: Secondary | ICD-10-CM | POA: Diagnosis not present

## 2018-11-11 DIAGNOSIS — M6281 Muscle weakness (generalized): Secondary | ICD-10-CM | POA: Diagnosis not present

## 2018-11-11 DIAGNOSIS — I480 Paroxysmal atrial fibrillation: Secondary | ICD-10-CM | POA: Diagnosis not present

## 2018-11-11 DIAGNOSIS — R001 Bradycardia, unspecified: Secondary | ICD-10-CM | POA: Diagnosis not present

## 2018-11-11 DIAGNOSIS — R498 Other voice and resonance disorders: Secondary | ICD-10-CM | POA: Diagnosis not present

## 2018-11-12 DIAGNOSIS — D509 Iron deficiency anemia, unspecified: Secondary | ICD-10-CM | POA: Diagnosis not present

## 2018-11-12 DIAGNOSIS — E211 Secondary hyperparathyroidism, not elsewhere classified: Secondary | ICD-10-CM | POA: Diagnosis not present

## 2018-11-12 DIAGNOSIS — Z23 Encounter for immunization: Secondary | ICD-10-CM | POA: Diagnosis not present

## 2018-11-12 DIAGNOSIS — N2581 Secondary hyperparathyroidism of renal origin: Secondary | ICD-10-CM | POA: Diagnosis not present

## 2018-11-12 DIAGNOSIS — E1122 Type 2 diabetes mellitus with diabetic chronic kidney disease: Secondary | ICD-10-CM | POA: Diagnosis not present

## 2018-11-12 DIAGNOSIS — N186 End stage renal disease: Secondary | ICD-10-CM | POA: Diagnosis not present

## 2018-11-14 DIAGNOSIS — D509 Iron deficiency anemia, unspecified: Secondary | ICD-10-CM | POA: Diagnosis not present

## 2018-11-14 DIAGNOSIS — Z23 Encounter for immunization: Secondary | ICD-10-CM | POA: Diagnosis not present

## 2018-11-14 DIAGNOSIS — N2581 Secondary hyperparathyroidism of renal origin: Secondary | ICD-10-CM | POA: Diagnosis not present

## 2018-11-14 DIAGNOSIS — E211 Secondary hyperparathyroidism, not elsewhere classified: Secondary | ICD-10-CM | POA: Diagnosis not present

## 2018-11-14 DIAGNOSIS — E1122 Type 2 diabetes mellitus with diabetic chronic kidney disease: Secondary | ICD-10-CM | POA: Diagnosis not present

## 2018-11-14 DIAGNOSIS — N186 End stage renal disease: Secondary | ICD-10-CM | POA: Diagnosis not present

## 2018-11-16 ENCOUNTER — Other Ambulatory Visit: Payer: Self-pay | Admitting: Radiology

## 2018-11-16 ENCOUNTER — Ambulatory Visit (HOSPITAL_COMMUNITY): Admission: RE | Admit: 2018-11-16 | Payer: Medicare Other | Source: Ambulatory Visit

## 2018-11-16 DIAGNOSIS — E1122 Type 2 diabetes mellitus with diabetic chronic kidney disease: Secondary | ICD-10-CM | POA: Diagnosis not present

## 2018-11-16 DIAGNOSIS — N186 End stage renal disease: Secondary | ICD-10-CM | POA: Diagnosis not present

## 2018-11-16 DIAGNOSIS — E211 Secondary hyperparathyroidism, not elsewhere classified: Secondary | ICD-10-CM | POA: Diagnosis not present

## 2018-11-16 DIAGNOSIS — D509 Iron deficiency anemia, unspecified: Secondary | ICD-10-CM | POA: Diagnosis not present

## 2018-11-16 DIAGNOSIS — N2581 Secondary hyperparathyroidism of renal origin: Secondary | ICD-10-CM | POA: Diagnosis not present

## 2018-11-16 DIAGNOSIS — Z23 Encounter for immunization: Secondary | ICD-10-CM | POA: Diagnosis not present

## 2018-11-17 ENCOUNTER — Encounter (HOSPITAL_COMMUNITY): Payer: Self-pay

## 2018-11-17 ENCOUNTER — Other Ambulatory Visit (HOSPITAL_COMMUNITY): Payer: Self-pay | Admitting: Nephrology

## 2018-11-17 ENCOUNTER — Other Ambulatory Visit: Payer: Self-pay

## 2018-11-17 ENCOUNTER — Ambulatory Visit (HOSPITAL_COMMUNITY)
Admission: RE | Admit: 2018-11-17 | Discharge: 2018-11-17 | Disposition: A | Discharge status: Home / Self Care | Payer: Medicare Other | Source: Ambulatory Visit | Attending: Nephrology | Admitting: Nephrology

## 2018-11-17 DIAGNOSIS — E1122 Type 2 diabetes mellitus with diabetic chronic kidney disease: Secondary | ICD-10-CM | POA: Diagnosis not present

## 2018-11-17 DIAGNOSIS — I953 Hypotension of hemodialysis: Secondary | ICD-10-CM | POA: Diagnosis not present

## 2018-11-17 DIAGNOSIS — I509 Heart failure, unspecified: Secondary | ICD-10-CM | POA: Insufficient documentation

## 2018-11-17 DIAGNOSIS — K219 Gastro-esophageal reflux disease without esophagitis: Secondary | ICD-10-CM | POA: Diagnosis not present

## 2018-11-17 DIAGNOSIS — H8110 Benign paroxysmal vertigo, unspecified ear: Secondary | ICD-10-CM | POA: Diagnosis not present

## 2018-11-17 DIAGNOSIS — E1142 Type 2 diabetes mellitus with diabetic polyneuropathy: Secondary | ICD-10-CM | POA: Diagnosis not present

## 2018-11-17 DIAGNOSIS — M170 Bilateral primary osteoarthritis of knee: Secondary | ICD-10-CM | POA: Insufficient documentation

## 2018-11-17 DIAGNOSIS — N186 End stage renal disease: Secondary | ICD-10-CM | POA: Diagnosis not present

## 2018-11-17 DIAGNOSIS — Z7982 Long term (current) use of aspirin: Secondary | ICD-10-CM | POA: Insufficient documentation

## 2018-11-17 DIAGNOSIS — I132 Hypertensive heart and chronic kidney disease with heart failure and with stage 5 chronic kidney disease, or end stage renal disease: Secondary | ICD-10-CM | POA: Diagnosis not present

## 2018-11-17 DIAGNOSIS — Z992 Dependence on renal dialysis: Principal | ICD-10-CM

## 2018-11-17 DIAGNOSIS — I480 Paroxysmal atrial fibrillation: Secondary | ICD-10-CM | POA: Insufficient documentation

## 2018-11-17 DIAGNOSIS — T82858A Stenosis of vascular prosthetic devices, implants and grafts, initial encounter: Secondary | ICD-10-CM | POA: Insufficient documentation

## 2018-11-17 DIAGNOSIS — Y832 Surgical operation with anastomosis, bypass or graft as the cause of abnormal reaction of the patient, or of later complication, without mention of misadventure at the time of the procedure: Secondary | ICD-10-CM | POA: Diagnosis not present

## 2018-11-17 HISTORY — PX: IR AV DIALY SHUNT INTRO NEEDLE/INTRACATH INITIAL W/PTA/IMG RIGHT: IMG6116

## 2018-11-17 LAB — POCT I-STAT, CHEM 8
BUN: 36 mg/dL — AB (ref 8–23)
CHLORIDE: 100 mmol/L (ref 98–111)
Calcium, Ion: 1.02 mmol/L — ABNORMAL LOW (ref 1.15–1.40)
Creatinine, Ser: 4.8 mg/dL — ABNORMAL HIGH (ref 0.44–1.00)
Glucose, Bld: 139 mg/dL — ABNORMAL HIGH (ref 70–99)
HEMATOCRIT: 36 % (ref 36.0–46.0)
Hemoglobin: 12.2 g/dL (ref 12.0–15.0)
POTASSIUM: 5.4 mmol/L — AB (ref 3.5–5.1)
SODIUM: 135 mmol/L (ref 135–145)
TCO2: 29 mmol/L (ref 22–32)

## 2018-11-17 LAB — GLUCOSE, CAPILLARY: Glucose-Capillary: 133 mg/dL — ABNORMAL HIGH (ref 70–99)

## 2018-11-17 MED ORDER — MIDAZOLAM HCL 2 MG/2ML IJ SOLN
INTRAMUSCULAR | Status: AC
  Filled 2018-11-17: qty 2

## 2018-11-17 MED ORDER — LIDOCAINE HCL 1 % IJ SOLN
INTRAMUSCULAR | Status: AC
  Filled 2018-11-17: qty 20

## 2018-11-17 MED ORDER — SODIUM CHLORIDE 0.9 % IV SOLN: INTRAVENOUS | Status: DC

## 2018-11-17 MED ORDER — LIDOCAINE HCL 1 % IJ SOLN
INTRAMUSCULAR | Status: AC | PRN
  Administered 2018-11-17: 5 mL

## 2018-11-17 MED ORDER — FENTANYL CITRATE (PF) 100 MCG/2ML IJ SOLN
INTRAMUSCULAR | Status: AC
  Filled 2018-11-17: qty 2

## 2018-11-17 MED ORDER — IOPAMIDOL (ISOVUE-300) INJECTION 61%
INTRAVENOUS | Status: AC
  Administered 2018-11-17: 50 mL
  Filled 2018-11-17: qty 100

## 2018-11-17 MED ORDER — FENTANYL CITRATE (PF) 100 MCG/2ML IJ SOLN
INTRAMUSCULAR | Status: AC | PRN
  Administered 2018-11-17: 50 ug via INTRAVENOUS

## 2018-11-17 MED ORDER — MIDAZOLAM HCL 2 MG/2ML IJ SOLN
INTRAMUSCULAR | Status: AC | PRN
  Administered 2018-11-17: 0.5 mg via INTRAVENOUS

## 2018-11-17 NOTE — Discharge Instructions (Addendum)
Dialysis Vascular Access Malfunction °A vascular access is an entrance to your blood vessels that can be used for dialysis. A vascular access can be made in one of several ways: °· Joining an artery to a vein under your skin to make a bigger blood vessel called a fistula. °· Joining an artery to a vein under your skin using a soft tube called a graft. °· Placing a thin, flexible tube (catheter) in a large vein, usually in your neck. ° °A vascular access may malfunction or become blocked. °What can cause your vascular access to malfunction? °· Infection (common). °· A blood clot inside a part of the fistula, graft, or catheter. A blood clot can completely or partially block the flow of blood. °· A kink in the graft or catheter. °· A collection of blood (called a hematoma or bruise) next to the graft or catheter that pushes against it, blocking the flow of blood. °What are signs and symptoms of vascular access malfunction? °· There is a change in the vibration or pulse of your fistula or graft. °· The vibration or pulse of your fistula or graft is gone. °· There is new or unusual swelling of the area around the access. °· There was an unsuccessful puncture of your access by the dialysis team. °· The flow of blood through the fistula, graft, or catheter is too slow for effective dialysis. °· When routine dialysis is completed and the needle is removed, bleeding lasts for too long a time. °What happens if my vascular access malfunctions? °Your health care provider may order blood work, cultures, or an X-ray test in order to learn what may be wrong with your vascular access. The X-ray test involves the injection of a liquid into the vascular access. The liquid shows up on the X-ray and allows your health care provider to see if there is a blockage in the vascular access. °Treatment varies depending on the cause of the malfunction: °· If the vascular access is infected, your health care provider may prescribe antibiotic  medicine to control the infection. °· If a clot is found in the vascular access, you may need surgery to remove the clot. °· If a blockage in the vascular access is due to some other cause (such as a kink in a graft), then you will likely need surgery to unblock or replace the graft. ° °Follow these instructions at home: °Follow up with your surgeon or other health care provider if you were instructed to do so. This is very important. Any delay in follow-up could cause permanent dysfunction of the vascular access, which may be dangerous. °Contact a health care provider if: °· Fever develops. °· Swelling and pain around the vascular access gets worse or new pain develops. °· Pain, numbness, or an unusual pale skin color develops in the hand on the side of your vascular access. °Get help right away if: °Unusual bleeding develops at the location of the vascular access. °This information is not intended to replace advice given to you by your health care provider. Make sure you discuss any questions you have with your health care provider. °Document Released: 11/10/2006 Document Revised: 05/15/2016 Document Reviewed: 05/12/2013 °Elsevier Interactive Patient Education © 2017 Else[employer] Inc[employer] °Moderate Conscious Sedation, Adult, Care After °These instructions provide you with information about caring for yourself after your procedure. Your health care provider may also give you more specific instructions. Your treatment has been planned according to current medical practices, but problems sometimes occur. Call your health   care provider if you have any problems or questions after your procedure. °What can I expect after the procedure? °After your procedure, it is common: °· To feel sleepy for several hours. °· To feel clumsy and have poor balance for several hours. °· To have poor judgment for several hours. °· To vomit if you eat too soon. ° °Follow these instructions at home: °For at least 24 hours after the  procedure: ° °· Do not: °? Participate in activities where you could fall or become injured. °? Drive. °? Use heavy machinery. °? Drink alcohol. °? Take sleeping pills or medicines that cause drowsiness. °? Make important decisions or sign legal documents. °? Take care of children on your own. °· Rest. °Eating and drinking °· Follow the diet recommended by your health care provider. °· If you vomit: °? Drink water, juice, or soup when you can drink without vomiting. °? Make sure you have little or no nausea before eating solid foods. °General instructions °· Have a responsible adult stay with you until you are awake and alert. °· Take over-the-counter and prescription medicines only as told by your health care provider. °· If you smoke, do not smoke without supervision. °· Keep all follow-up visits as told by your health care provider. This is important. °Contact a health care provider if: °· You keep feeling nauseous or you keep vomiting. °· You feel light-headed. °· You develop a rash. °· You have a fever. °Get help right away if: °· You have trouble breathing. °This information is not intended to replace advice given to you by your health care provider. Make sure you discuss any questions you have with your health care provider. °Document Released: 09/28/2013 Document Revised: 05/12/2016 Document Reviewed: 03/29/2016 °Elsevier Interactive Patient Education © 2018 Elsevier Inc. ° °

## 2018-11-17 NOTE — Progress Notes (Signed)
[facility] was called for transportation of pt.  They were told to be here at 1445.

## 2018-11-17 NOTE — Progress Notes (Signed)
TRansportation here. Pt transpoted back to [facility]

## 2018-11-17 NOTE — H&P (Addendum)
Chief Complaint: Patient was seen in consultation today for right arm dialysis graft evaluation and possible intervention at the request of [place]  Referring Physician(s): [place]  Supervising Physician: Markus Daft  Patient Status: [facility] - Out-pt  History of Present Illness: [name] James is a 82 y.o. female   ESRD Right arm dialysis graft  Swelling for few days Last use yesterday-- completed treatment   Last intervention : 08/25/2018 IMPRESSION: right arm shuntogram Successful balloon angioplasty of moderate to long length severe (approximately 75%) luminal narrowing of the venous anastomosis and peripheral aspect of the draining basilic vein ultimately to 7 mm diameter. ACCESS: This access remains amenable to future percutaneous interventions as clinically indicated.   Past Medical History:  Diagnosis Date  . Anemia, chronic disease   . Anxiety   . Arthritis    knees  . Atrial fibrillation ([place])   . Barrett esophagus   . Benign paroxysmal positional vertigo   . Breast cancer ([place]) 1980s   bil mastectomies, no radiation or chemo  . Cataract   . Chest pain, atypical 12/05/2008   R/Lmv- normal perfusion all regions, noe ECG changes   . CHF (congestive heart failure) ([place]) 05/13/2011   echo - JE>56%; stage 1 diastolic dysfunction; elevated LV filling pressure, MAC  . CKD (chronic kidney disease), stage III ([place])   . Claudication ([place]) 10/30/2005   doppler - normal evaluation, no evidence of aneurysm, diameter reduction, dissection, compression or vascular abnormality)  . Complication of anesthesia   . Dyslipidemia   . Dysrhythmia    Atrial Fibrillation  . Edema   . ESRD (end stage renal disease) on dialysis ([place])    "[facility]; [address]; MWF" (07/06/2018)  . Family history of adverse reaction to anesthesia    son also has nausea   . Fever blister 12/2016  . Gastritis and gastroduodenitis   . GERD (gastroesophageal reflux disease)     . Gout   . Hemodialysis-associated hypotension   . HLD (hyperlipidemia)   . Hypertension   . IBS (irritable bowel syndrome)   . Insomnia   . Morbid obesity ([place])   . On home oxygen therapy    "2L; 24/7" (07/06/2018)  . PAF (paroxysmal atrial fibrillation) ([place])   . Peripheral neuropathy   . Pneumonia 12/2016  . PONV (postoperative nausea and vomiting)   . Renal cyst   . Renal insufficiency 10/30/2005   doppler - abn resistance consistent w/ parenchymal disease  . Renovascular hypertension   . Type 2 diabetes mellitus with diabetic neuropathy, with long-term current use of insulin ([place])   . Vertigo     Past Surgical History:  Procedure Laterality Date  . A/V FISTULAGRAM Left 03/19/2018   Procedure: A/V FISTULAGRAM;  Surgeon: Angelia Mould, MD;  Location: [place] [facility];  Service: Cardiovascular;  Laterality: Left;  . A/V SHUNTOGRAM Left 01/19/2018   Procedure: A/V SHUNTOGRAM;  Surgeon: Serafina Mitchell, MD;  Location: [place] [facility];  Service: Cardiovascular;  Laterality: Left;  . ANGIOPLASTY Left 02/12/2015   Procedure: ANGIOPLASTY;  Surgeon: Angelia Mould, MD;  Location: [facility] CATH LAB;  Service: Cardiovascular;  Laterality: Left;  AVF  . AV FISTULA PLACEMENT Left 11/30/2014   Procedure: ARTERIOVENOUS (AV) FISTULA CREATION LEFT ARM;  Surgeon: Angelia Mould, MD;  Location: [place];  Service: Vascular;  Laterality: Left;  . AV FISTULA PLACEMENT Left 03/20/2015   Procedure:  Inserton of Left Upper Arm Gortex Graft;  Surgeon: Angelia Mould, MD;  Location: [facility] OR;  Service: Vascular;  Laterality: Left;  . AV FISTULA PLACEMENT Left 04/12/2015   Procedure: INSERTION OF LEFT ARM  ARTERIOVENOUS GORE-TEX GRAFT ;  Surgeon: Angelia Mould, MD;  Location: [place];  Service: Vascular;  Laterality: Left;  . AV FISTULA PLACEMENT Right 06/08/2018   Procedure: INSERTION OF ARTERIOVENOUS (AV) GORE-TEX GRAFT ARM USING 4-7MM X 45CM GORETEX GRAFT;  Surgeon: Angelia Mould, MD;  Location: [place];  Service: Vascular;  Laterality: Right;  . BACK SURGERY  ~1980  . CATARACT EXTRACTION Right   . CHOLECYSTECTOMY    . ESOPHAGOGASTRODUODENOSCOPY N/A 10/24/2014   Procedure: ESOPHAGOGASTRODUODENOSCOPY (EGD);  Surgeon: Jerene Bears, MD;  Location: [facility] ENDOSCOPY;  Service: Endoscopy;  Laterality: N/A;  . EXCHANGE OF A DIALYSIS CATHETER Left 11/30/2014   Procedure: EXCHANGE OF A DIALYSIS CATHETER, LEFT INTERNAL JUGULAR;  Surgeon: Angelia Mould, MD;  Location: [place];  Service: Vascular;  Laterality: Left;  . EYE SURGERY Left    transplant  . FISTULOGRAM N/A 02/12/2015   Procedure: FISTULOGRAM;  Surgeon: Angelia Mould, MD;  Location: [facility] CATH LAB;  Service: Cardiovascular;  Laterality: N/A;  . HEMORRHOID SURGERY    . IR AV DIALY SHUNT INTRO NEEDLE/INTRACATH INITIAL W/PTA/IMG RIGHT Right 08/25/2018  . IR US GUIDE VASC ACCESS RIGHT  08/25/2018  . LIGATION ARTERIOVENOUS GORTEX GRAFT Left 08/27/2018   Procedure: LIGATION ARTERIOVENOUS GORTEX GRAFT ARM;  Surgeon: Angelia Mould, MD;  Location: [place];  Service: Vascular;  Laterality: Left;  . LIGATION OF ARTERIOVENOUS  FISTULA Left 03/20/2015   Procedure: LIGATION OF ARTERIOVENOUS  FISTULA;  Surgeon: Angelia Mould, MD;  Location: [place];  Service: Vascular;  Laterality: Left;  Marland Kitchen MASTECTOMY Bilateral ~1982   bil breast reconstruction with implants  . ORIF TIBIA & FIBULA FRACTURES Left 2007   also had left non displaced malleolar fracture.   Marland Kitchen PERIPHERAL VASCULAR BALLOON ANGIOPLASTY Left 01/19/2018   Procedure: PERIPHERAL VASCULAR BALLOON ANGIOPLASTY;  Surgeon: Serafina Mitchell, MD;  Location: [place] [facility];  Service: Cardiovascular;  Laterality: Left;  fistula  . PERIPHERAL VASCULAR BALLOON ANGIOPLASTY Left 03/19/2018   Procedure: PERIPHERAL VASCULAR BALLOON ANGIOPLASTY;  Surgeon: Angelia Mould, MD;  Location: [place] [facility];  Service: Cardiovascular;  Laterality: Left;  UPPER ARM  FISTULA  . REMOVAL OF GRAFT Left 04/12/2015   Procedure: REMOVAL OF LEFT ARM ARTERIOVENOUS GORE-TEX GRAFT;  Surgeon: Angelia Mould, MD;  Location: [place];  Service: Vascular;  Laterality: Left;    Allergies: Erythromycin; Codeine; Erythromycin base; Penicillin g; Penicillins; and Zantac [ranitidine hcl]  Medications: Prior to Admission medications   Medication Sig Start Date End Date Taking? Authorizing Provider  allopurinol (ZYLOPRIM) 100 MG tablet Take 100 mg by mouth daily.   Yes [provider]  aspirin 81 MG chewable tablet Chew 81 mg by mouth daily.   Yes [provider]  B Complex-C-Folic Acid (NEPHRO VITAMINS) 0.8 MG TABS Take 1 tablet by mouth daily.   Yes [provider]  Brinzolamide-Brimonidine (SIMBRINZA) 1-0.2 % SUSP Place 1 drop into both eyes 2 (two) times daily.   Yes [provider]  calcium elemental as carbonate (BARIATRIC TUMS ULTRA) 400 MG chewable tablet Chew 1,000 mg by mouth 3 (three) times daily with meals.   Yes [provider]  diazepam (VALIUM) 5 MG tablet Take 1 tablet (5 mg total) by mouth at bedtime. 07/08/18  Yes Arrien, Jimmy Picket, MD  DIMETHICONE, TOPICAL, 5 % CREA Apply 1  application topically See admin instructions. Place a nickel-sized amount onto buttock(s) area 2 times a day for barrier protection   Yes [provider]  hydrALAZINE (APRESOLINE) 10 MG tablet Take 5 mg by mouth 3 (three) times daily as needed (hypertension).   Yes [provider]  loperamide (IMODIUM A-D) 2 MG tablet Take 2 mg by mouth 3 (three) times daily as needed (for diarrhea).    Yes [provider]  loratadine (CLARITIN) 10 MG tablet Take 10 mg by mouth daily.   Yes [provider]  meclizine (ANTIVERT) 25 MG tablet Take 25 mg by mouth 3 (three) times daily as needed for dizziness.   Yes [provider]  Melatonin 3 MG TABS Take 6 mg by mouth at bedtime.    Yes [provider]   midodrine (PROAMATINE) 10 MG tablet Take 10 mg by mouth See admin instructions. Take 10 mg by mouth WITH MORNING MEDS AT FACILITY on Mon/Wed/Fri- dialysis days Take 10 mg by mouth with dialysis on Mon/Wed/Fri   Yes [provider]  midodrine (PROAMATINE) 2.5 MG tablet Take 2.5 mg by mouth See admin instructions. 3 times daily on Sun Tues Thurs and Sat , and 2 times daily on Mon Wed and Fri after Dialysis   Yes [provider]  OXYGEN Inhale 2 L into the lungs continuous.    Yes [provider]  Polyethyl Glycol-Propyl Glycol (SYSTANE ULTRA) 0.4-0.3 % SOLN Place 1 drop into both eyes 3 (three) times daily.   Yes [provider]  promethazine (PHENERGAN) 25 MG tablet Take 1 tablet by mouth every 8 (eight) hours as needed for nausea or vomiting.  09/14/18  Yes [provider]  traZODone (DESYREL) 50 MG tablet Take 25 mg by mouth at bedtime.    Yes [provider]  acetaminophen (TYLENOL) 325 MG tablet Take 650 mg by mouth every 6 (six) hours as needed for fever ("for vomiting/nausea," per [facility]).     [provider]     Family History  Problem Relation Age of Onset  . Diabetes Mother   . Heart disease Mother   . Non-Hodgkin's lymphoma Mother   . Heart attack Father   . Stroke Father   . Parkinson's disease Brother   . Lung disease Brother   . Heart disease Brother   . Multiple myeloma Sister     Social History   Socioeconomic History  . Marital status: Divorced    Spouse name: Not on file  . Number of children: Not on file  . Years of education: Not on file  . Highest education level: Not on file  Occupational History  . Not on file  Social Needs  . Financial resource strain: Not on file  . Food insecurity:    Worry: Not on file    Inability: Not on file  . Transportation needs:    Medical: Not on file    Non-medical: Not on file  Tobacco Use  . Smoking status: Never Smoker  . Smokeless tobacco: Never Used    Substance and Sexual Activity  . Alcohol use: No  . Drug use: No  . Sexual activity: Not on file  Lifestyle  . Physical activity:    Days per week: Not on file    Minutes per session: Not on file  . Stress: Not on file  Relationships  . Social connections:    Talks on phone: Not on file    Gets together: Not on file  Attends religious service: Not on file    Active member of club or organization: Not on file    Attends meetings of clubs or organizations: Not on file    Relationship status: Not on file  Other Topics Concern  . Not on file  Social History Narrative   [facility] (10/06/17):   Originally from [facility] [place] [facility]. Has always lived in [place]. She currently lives in [place]. No bird exposure. [place] Previously traveled to [place], [place], & [place]. Previously worked in a nursery and also a Pharmacist, community.     Review of Systems: A 12 point ROS discussed and pertinent positives are indicated in the HPI above.  All other systems are negative.  Review of Systems  Constitutional: Positive for fatigue. Negative for activity change and fever.  Respiratory: Negative for cough and shortness of breath.   Cardiovascular: Negative for chest pain.  Gastrointestinal: Negative for abdominal pain.  Neurological: Positive for weakness.  Psychiatric/Behavioral: Negative for behavioral problems and confusion.    Vital Signs: BP (!) 114/58   Pulse 81   Temp 98 F (36.7 C) (Oral)   Ht 5' 4" (1.626 m)   Wt 152 lb (68.9 kg)   SpO2 100%   BMI 26.09 kg/m   Physical Exam  Constitutional: She is oriented to person, place, and time.  Cardiovascular: Normal rate and regular rhythm.  Pulmonary/Chest: Effort normal. No respiratory distress.  Abdominal: Soft. Bowel sounds are normal.  Musculoskeletal: Normal range of motion.  right arm dialysis graft +pulse No thrill Ecchymotic and swollen  Neurological: She is alert and oriented to person, place, and time.  Skin: Skin is warm and  dry.  Psychiatric: She has a normal mood and affect. Her behavior is normal. Judgment and thought content normal.  Vitals reviewed.   Imaging: No results found.  Labs:  CBC: Recent Labs    08/25/18 0328 08/26/18 0446 08/27/18 0418 08/28/18 0614 11/17/18 1102  WBC 10.1 7.7 8.1 9.6  --   HGB 11.8* 11.8* 11.3* 11.4* 12.2  HCT 38.4 38.4 36.4 36.4 36.0  PLT 166 147* 160 172  --     COAGS: Recent Labs    07/05/18 2129 08/25/18 0328  INR 1.03 1.22    BMP: Recent Labs    08/25/18 0328 08/26/18 0446 08/27/18 0418 08/28/18 0614 11/17/18 1102  NA 137 138 136 136 135  K 4.9 4.0 4.5 3.5 5.4*  CL 102 100 98 95* 100  CO2 21* _0 --   GLUCOSE 121* 89 124* 142* 139*  BUN 48* 23 41* 16 36*  CALCIUM 7.6* 7.6* 7.2* 8.3*  --   CREATININE 5.86* 3.78* 5.12* 2.85* 4.80*  GFRNONAA 6* 10* 7* 15*  --   GFRAA 7* 12* 8* 17*  --     LIVER FUNCTION TESTS: Recent Labs    07/07/18 1521 08/23/18 1850  BILITOT  --  0.7  AST  --  50*  ALT  --  27  ALKPHOS  --  132*  PROT  --  7.6  ALBUMIN 2.6* 3.2*    TUMOR MARKERS: No results for input(s): AFPTM, CEA, CA199, CHROMGRNA in the last 8760 hours.  Assessment and Plan:  Right  arm dialysis graft  In place Right  arm swelling; ecchymosis Completed session yesterday. Scheduled now for dialysis graft evaluation and possible intervention. Pt is aware of procedure benefits and risks. Including but not limited to: infection, bleeding, damage to graft; damage to surrounding  structures Agreeable to proceed Consent signed and in chart  Thank you for this interesting consult.  I greatly enjoyed meeting ANTARA BRECHEISEN and look forward to participating in their care.  A copy of this report was sent to the requesting provider on this date.  Electronically Signed: Lavonia Drafts, PA-C 11/17/2018, 11:05 AM   I spent a total of  30 Minutes   in face to face in clinical consultation, greater than 50% of which was  counseling/coordinating care for right arm dialysis graft eval and possible intervention

## 2018-11-17 NOTE — Procedures (Signed)
  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  Findings: Greater than 70% stenosis in outflow vein in upper arm.  Successful balloon angioplasty of treated vein.  Central veins patent.    Complications: None     EBL: Minimal  Plan: AV graft is ready for use.  Remove suture at next dialysis session.

## 2018-11-17 NOTE — Progress Notes (Signed)
Report called to [facility], [place].  Pt in wheel chair ready to go but transportation is not here yet.  [place] will contact them call me back

## 2018-11-18 DIAGNOSIS — I959 Hypotension, unspecified: Secondary | ICD-10-CM | POA: Diagnosis not present

## 2018-11-18 DIAGNOSIS — E1129 Type 2 diabetes mellitus with other diabetic kidney complication: Secondary | ICD-10-CM | POA: Diagnosis not present

## 2018-11-18 DIAGNOSIS — I480 Paroxysmal atrial fibrillation: Secondary | ICD-10-CM | POA: Diagnosis not present

## 2018-11-18 DIAGNOSIS — R001 Bradycardia, unspecified: Secondary | ICD-10-CM | POA: Diagnosis not present

## 2018-11-18 DIAGNOSIS — M6281 Muscle weakness (generalized): Secondary | ICD-10-CM | POA: Diagnosis not present

## 2018-11-18 DIAGNOSIS — R498 Other voice and resonance disorders: Secondary | ICD-10-CM | POA: Diagnosis not present

## 2018-11-19 DIAGNOSIS — E211 Secondary hyperparathyroidism, not elsewhere classified: Secondary | ICD-10-CM | POA: Diagnosis not present

## 2018-11-19 DIAGNOSIS — D509 Iron deficiency anemia, unspecified: Secondary | ICD-10-CM | POA: Diagnosis not present

## 2018-11-19 DIAGNOSIS — N2581 Secondary hyperparathyroidism of renal origin: Secondary | ICD-10-CM | POA: Diagnosis not present

## 2018-11-19 DIAGNOSIS — N186 End stage renal disease: Secondary | ICD-10-CM | POA: Diagnosis not present

## 2018-11-19 DIAGNOSIS — E1122 Type 2 diabetes mellitus with diabetic chronic kidney disease: Secondary | ICD-10-CM | POA: Diagnosis not present

## 2018-11-19 DIAGNOSIS — Z23 Encounter for immunization: Secondary | ICD-10-CM | POA: Diagnosis not present

## 2018-11-21 DIAGNOSIS — E1122 Type 2 diabetes mellitus with diabetic chronic kidney disease: Secondary | ICD-10-CM | POA: Diagnosis not present

## 2018-11-21 DIAGNOSIS — Z992 Dependence on renal dialysis: Secondary | ICD-10-CM | POA: Diagnosis not present

## 2018-11-21 DIAGNOSIS — N186 End stage renal disease: Secondary | ICD-10-CM | POA: Diagnosis not present

## 2018-11-22 DIAGNOSIS — D631 Anemia in chronic kidney disease: Secondary | ICD-10-CM | POA: Diagnosis not present

## 2018-11-22 DIAGNOSIS — E211 Secondary hyperparathyroidism, not elsewhere classified: Secondary | ICD-10-CM | POA: Diagnosis not present

## 2018-11-22 DIAGNOSIS — F419 Anxiety disorder, unspecified: Secondary | ICD-10-CM | POA: Diagnosis not present

## 2018-11-22 DIAGNOSIS — D509 Iron deficiency anemia, unspecified: Secondary | ICD-10-CM | POA: Diagnosis not present

## 2018-11-22 DIAGNOSIS — N186 End stage renal disease: Secondary | ICD-10-CM | POA: Diagnosis not present

## 2018-11-22 DIAGNOSIS — G4700 Insomnia, unspecified: Secondary | ICD-10-CM | POA: Diagnosis not present

## 2018-11-22 DIAGNOSIS — N2581 Secondary hyperparathyroidism of renal origin: Secondary | ICD-10-CM | POA: Diagnosis not present

## 2018-11-22 DIAGNOSIS — E1122 Type 2 diabetes mellitus with diabetic chronic kidney disease: Secondary | ICD-10-CM | POA: Diagnosis not present

## 2018-11-23 DIAGNOSIS — R293 Abnormal posture: Secondary | ICD-10-CM | POA: Diagnosis not present

## 2018-11-23 DIAGNOSIS — E1129 Type 2 diabetes mellitus with other diabetic kidney complication: Secondary | ICD-10-CM | POA: Diagnosis not present

## 2018-11-23 DIAGNOSIS — R498 Other voice and resonance disorders: Secondary | ICD-10-CM | POA: Diagnosis not present

## 2018-11-23 DIAGNOSIS — I959 Hypotension, unspecified: Secondary | ICD-10-CM | POA: Diagnosis not present

## 2018-11-23 DIAGNOSIS — I480 Paroxysmal atrial fibrillation: Secondary | ICD-10-CM | POA: Diagnosis not present

## 2018-11-23 DIAGNOSIS — R001 Bradycardia, unspecified: Secondary | ICD-10-CM | POA: Diagnosis not present

## 2018-11-23 DIAGNOSIS — J9611 Chronic respiratory failure with hypoxia: Secondary | ICD-10-CM | POA: Diagnosis not present

## 2018-11-23 DIAGNOSIS — M6281 Muscle weakness (generalized): Secondary | ICD-10-CM | POA: Diagnosis not present

## 2018-11-23 DIAGNOSIS — R41841 Cognitive communication deficit: Secondary | ICD-10-CM | POA: Diagnosis not present

## 2018-11-24 DIAGNOSIS — E211 Secondary hyperparathyroidism, not elsewhere classified: Secondary | ICD-10-CM | POA: Diagnosis not present

## 2018-11-24 DIAGNOSIS — D631 Anemia in chronic kidney disease: Secondary | ICD-10-CM | POA: Diagnosis not present

## 2018-11-24 DIAGNOSIS — D509 Iron deficiency anemia, unspecified: Secondary | ICD-10-CM | POA: Diagnosis not present

## 2018-11-24 DIAGNOSIS — E1122 Type 2 diabetes mellitus with diabetic chronic kidney disease: Secondary | ICD-10-CM | POA: Diagnosis not present

## 2018-11-24 DIAGNOSIS — N186 End stage renal disease: Secondary | ICD-10-CM | POA: Diagnosis not present

## 2018-11-24 DIAGNOSIS — N2581 Secondary hyperparathyroidism of renal origin: Secondary | ICD-10-CM | POA: Diagnosis not present

## 2018-11-25 DIAGNOSIS — R498 Other voice and resonance disorders: Secondary | ICD-10-CM | POA: Diagnosis not present

## 2018-11-25 DIAGNOSIS — I959 Hypotension, unspecified: Secondary | ICD-10-CM | POA: Diagnosis not present

## 2018-11-25 DIAGNOSIS — M6281 Muscle weakness (generalized): Secondary | ICD-10-CM | POA: Diagnosis not present

## 2018-11-25 DIAGNOSIS — E1129 Type 2 diabetes mellitus with other diabetic kidney complication: Secondary | ICD-10-CM | POA: Diagnosis not present

## 2018-11-25 DIAGNOSIS — I480 Paroxysmal atrial fibrillation: Secondary | ICD-10-CM | POA: Diagnosis not present

## 2018-11-25 DIAGNOSIS — R001 Bradycardia, unspecified: Secondary | ICD-10-CM | POA: Diagnosis not present

## 2018-11-26 DIAGNOSIS — E1122 Type 2 diabetes mellitus with diabetic chronic kidney disease: Secondary | ICD-10-CM | POA: Diagnosis not present

## 2018-11-26 DIAGNOSIS — D631 Anemia in chronic kidney disease: Secondary | ICD-10-CM | POA: Diagnosis not present

## 2018-11-26 DIAGNOSIS — E211 Secondary hyperparathyroidism, not elsewhere classified: Secondary | ICD-10-CM | POA: Diagnosis not present

## 2018-11-26 DIAGNOSIS — N2581 Secondary hyperparathyroidism of renal origin: Secondary | ICD-10-CM | POA: Diagnosis not present

## 2018-11-26 DIAGNOSIS — N186 End stage renal disease: Secondary | ICD-10-CM | POA: Diagnosis not present

## 2018-11-26 DIAGNOSIS — D509 Iron deficiency anemia, unspecified: Secondary | ICD-10-CM | POA: Diagnosis not present

## 2018-11-29 DIAGNOSIS — E211 Secondary hyperparathyroidism, not elsewhere classified: Secondary | ICD-10-CM | POA: Diagnosis not present

## 2018-11-29 DIAGNOSIS — N186 End stage renal disease: Secondary | ICD-10-CM | POA: Diagnosis not present

## 2018-11-29 DIAGNOSIS — E1122 Type 2 diabetes mellitus with diabetic chronic kidney disease: Secondary | ICD-10-CM | POA: Diagnosis not present

## 2018-11-29 DIAGNOSIS — D509 Iron deficiency anemia, unspecified: Secondary | ICD-10-CM | POA: Diagnosis not present

## 2018-11-29 DIAGNOSIS — D631 Anemia in chronic kidney disease: Secondary | ICD-10-CM | POA: Diagnosis not present

## 2018-11-29 DIAGNOSIS — N2581 Secondary hyperparathyroidism of renal origin: Secondary | ICD-10-CM | POA: Diagnosis not present

## 2018-11-30 DIAGNOSIS — R001 Bradycardia, unspecified: Secondary | ICD-10-CM | POA: Diagnosis not present

## 2018-11-30 DIAGNOSIS — R498 Other voice and resonance disorders: Secondary | ICD-10-CM | POA: Diagnosis not present

## 2018-11-30 DIAGNOSIS — M6281 Muscle weakness (generalized): Secondary | ICD-10-CM | POA: Diagnosis not present

## 2018-11-30 DIAGNOSIS — I959 Hypotension, unspecified: Secondary | ICD-10-CM | POA: Diagnosis not present

## 2018-11-30 DIAGNOSIS — E1129 Type 2 diabetes mellitus with other diabetic kidney complication: Secondary | ICD-10-CM | POA: Diagnosis not present

## 2018-11-30 DIAGNOSIS — I480 Paroxysmal atrial fibrillation: Secondary | ICD-10-CM | POA: Diagnosis not present

## 2018-12-01 DIAGNOSIS — E1122 Type 2 diabetes mellitus with diabetic chronic kidney disease: Secondary | ICD-10-CM | POA: Diagnosis not present

## 2018-12-01 DIAGNOSIS — N186 End stage renal disease: Secondary | ICD-10-CM | POA: Diagnosis not present

## 2018-12-01 DIAGNOSIS — N2581 Secondary hyperparathyroidism of renal origin: Secondary | ICD-10-CM | POA: Diagnosis not present

## 2018-12-01 DIAGNOSIS — E211 Secondary hyperparathyroidism, not elsewhere classified: Secondary | ICD-10-CM | POA: Diagnosis not present

## 2018-12-01 DIAGNOSIS — D631 Anemia in chronic kidney disease: Secondary | ICD-10-CM | POA: Diagnosis not present

## 2018-12-01 DIAGNOSIS — D509 Iron deficiency anemia, unspecified: Secondary | ICD-10-CM | POA: Diagnosis not present

## 2018-12-02 DIAGNOSIS — E1129 Type 2 diabetes mellitus with other diabetic kidney complication: Secondary | ICD-10-CM | POA: Diagnosis not present

## 2018-12-02 DIAGNOSIS — R498 Other voice and resonance disorders: Secondary | ICD-10-CM | POA: Diagnosis not present

## 2018-12-02 DIAGNOSIS — M25571 Pain in right ankle and joints of right foot: Secondary | ICD-10-CM | POA: Diagnosis not present

## 2018-12-02 DIAGNOSIS — M25561 Pain in right knee: Secondary | ICD-10-CM | POA: Diagnosis not present

## 2018-12-02 DIAGNOSIS — M6281 Muscle weakness (generalized): Secondary | ICD-10-CM | POA: Diagnosis not present

## 2018-12-02 DIAGNOSIS — I480 Paroxysmal atrial fibrillation: Secondary | ICD-10-CM | POA: Diagnosis not present

## 2018-12-02 DIAGNOSIS — I959 Hypotension, unspecified: Secondary | ICD-10-CM | POA: Diagnosis not present

## 2018-12-02 DIAGNOSIS — R001 Bradycardia, unspecified: Secondary | ICD-10-CM | POA: Diagnosis not present

## 2018-12-02 DIAGNOSIS — M79661 Pain in right lower leg: Secondary | ICD-10-CM | POA: Diagnosis not present

## 2018-12-03 DIAGNOSIS — M25561 Pain in right knee: Secondary | ICD-10-CM | POA: Diagnosis not present

## 2018-12-03 DIAGNOSIS — E211 Secondary hyperparathyroidism, not elsewhere classified: Secondary | ICD-10-CM | POA: Diagnosis not present

## 2018-12-03 DIAGNOSIS — E1122 Type 2 diabetes mellitus with diabetic chronic kidney disease: Secondary | ICD-10-CM | POA: Diagnosis not present

## 2018-12-03 DIAGNOSIS — N186 End stage renal disease: Secondary | ICD-10-CM | POA: Diagnosis not present

## 2018-12-03 DIAGNOSIS — N2581 Secondary hyperparathyroidism of renal origin: Secondary | ICD-10-CM | POA: Diagnosis not present

## 2018-12-03 DIAGNOSIS — D509 Iron deficiency anemia, unspecified: Secondary | ICD-10-CM | POA: Diagnosis not present

## 2018-12-03 DIAGNOSIS — T1490XA Injury, unspecified, initial encounter: Secondary | ICD-10-CM | POA: Diagnosis not present

## 2018-12-03 DIAGNOSIS — D631 Anemia in chronic kidney disease: Secondary | ICD-10-CM | POA: Diagnosis not present

## 2018-12-06 DIAGNOSIS — E211 Secondary hyperparathyroidism, not elsewhere classified: Secondary | ICD-10-CM | POA: Diagnosis not present

## 2018-12-06 DIAGNOSIS — E1122 Type 2 diabetes mellitus with diabetic chronic kidney disease: Secondary | ICD-10-CM | POA: Diagnosis not present

## 2018-12-06 DIAGNOSIS — D509 Iron deficiency anemia, unspecified: Secondary | ICD-10-CM | POA: Diagnosis not present

## 2018-12-06 DIAGNOSIS — N186 End stage renal disease: Secondary | ICD-10-CM | POA: Diagnosis not present

## 2018-12-06 DIAGNOSIS — N2581 Secondary hyperparathyroidism of renal origin: Secondary | ICD-10-CM | POA: Diagnosis not present

## 2018-12-06 DIAGNOSIS — D631 Anemia in chronic kidney disease: Secondary | ICD-10-CM | POA: Diagnosis not present

## 2018-12-07 ENCOUNTER — Other Ambulatory Visit: Payer: Self-pay | Admitting: Pulmonary Disease

## 2018-12-07 DIAGNOSIS — D509 Iron deficiency anemia, unspecified: Secondary | ICD-10-CM | POA: Diagnosis not present

## 2018-12-07 DIAGNOSIS — E1129 Type 2 diabetes mellitus with other diabetic kidney complication: Secondary | ICD-10-CM | POA: Diagnosis not present

## 2018-12-07 DIAGNOSIS — R001 Bradycardia, unspecified: Secondary | ICD-10-CM | POA: Diagnosis not present

## 2018-12-07 DIAGNOSIS — I959 Hypotension, unspecified: Secondary | ICD-10-CM | POA: Diagnosis not present

## 2018-12-07 DIAGNOSIS — R498 Other voice and resonance disorders: Secondary | ICD-10-CM | POA: Diagnosis not present

## 2018-12-07 DIAGNOSIS — M25561 Pain in right knee: Secondary | ICD-10-CM | POA: Diagnosis not present

## 2018-12-07 DIAGNOSIS — D631 Anemia in chronic kidney disease: Secondary | ICD-10-CM | POA: Diagnosis not present

## 2018-12-07 DIAGNOSIS — E1122 Type 2 diabetes mellitus with diabetic chronic kidney disease: Secondary | ICD-10-CM | POA: Diagnosis not present

## 2018-12-07 DIAGNOSIS — T1490XD Injury, unspecified, subsequent encounter: Secondary | ICD-10-CM | POA: Diagnosis not present

## 2018-12-07 DIAGNOSIS — N2581 Secondary hyperparathyroidism of renal origin: Secondary | ICD-10-CM | POA: Diagnosis not present

## 2018-12-07 DIAGNOSIS — E211 Secondary hyperparathyroidism, not elsewhere classified: Secondary | ICD-10-CM | POA: Diagnosis not present

## 2018-12-07 DIAGNOSIS — M6281 Muscle weakness (generalized): Secondary | ICD-10-CM | POA: Diagnosis not present

## 2018-12-07 DIAGNOSIS — I480 Paroxysmal atrial fibrillation: Secondary | ICD-10-CM | POA: Diagnosis not present

## 2018-12-07 DIAGNOSIS — N186 End stage renal disease: Secondary | ICD-10-CM | POA: Diagnosis not present

## 2018-12-07 DIAGNOSIS — R918 Other nonspecific abnormal finding of lung field: Secondary | ICD-10-CM

## 2018-12-08 ENCOUNTER — Encounter: Payer: Self-pay | Admitting: *Deleted

## 2018-12-08 ENCOUNTER — Other Ambulatory Visit: Payer: Self-pay | Admitting: *Deleted

## 2018-12-08 ENCOUNTER — Ambulatory Visit (INDEPENDENT_AMBULATORY_CARE_PROVIDER_SITE_OTHER): Payer: Medicare Other | Admitting: Vascular Surgery

## 2018-12-08 ENCOUNTER — Other Ambulatory Visit: Payer: Self-pay

## 2018-12-08 ENCOUNTER — Encounter: Payer: Self-pay | Admitting: Vascular Surgery

## 2018-12-08 VITALS — BP 138/69 | HR 66 | Temp 97.5°F | Resp 20 | Ht 64.0 in | Wt 152.0 lb

## 2018-12-08 DIAGNOSIS — M25561 Pain in right knee: Secondary | ICD-10-CM | POA: Diagnosis not present

## 2018-12-08 DIAGNOSIS — N186 End stage renal disease: Secondary | ICD-10-CM | POA: Diagnosis not present

## 2018-12-08 DIAGNOSIS — Z992 Dependence on renal dialysis: Secondary | ICD-10-CM

## 2018-12-08 DIAGNOSIS — T1490XD Injury, unspecified, subsequent encounter: Secondary | ICD-10-CM | POA: Diagnosis not present

## 2018-12-08 NOTE — H&P (View-Only) (Signed)
Patient name: [name] James MRN: [id number] DOB: [birth year] Sex: female  REASON FOR VISIT:   Persistent swelling after a shuntogram.  The patient was referred by the [facility] kidney center.  HPI:   [name] James is a pleasant 82 y.o. female dialyzes on Monday Wednesdays and Fridays.  She has a right forearm AV graft.  She has been having problems with increasing arm swelling on the right and was sent for evaluation.  To the best of her knowledge the graft has been working well.  However she is had more and more swelling over the last few weeks.  She denies significant pain in the right arm.  She does occasionally have some paresthesias in the hand but this is not new.  Current Outpatient Medications  Medication Sig Dispense Refill  . acetaminophen (TYLENOL) 325 MG tablet Take 650 mg by mouth every 6 (six) hours as needed for fever ("for vomiting/nausea," per [facility]).     Marland Kitchen allopurinol (ZYLOPRIM) 100 MG tablet Take 100 mg by mouth daily.    Marland Kitchen aspirin 81 MG chewable tablet Chew 81 mg by mouth daily.    . B Complex-C-Folic Acid (NEPHRO VITAMINS) 0.8 MG TABS Take 1 tablet by mouth daily.    . Brinzolamide-Brimonidine (SIMBRINZA) 1-0.2 % SUSP Place 1 drop into both eyes 2 (two) times daily.    . calcium elemental as carbonate (BARIATRIC TUMS ULTRA) 400 MG chewable tablet Chew 1,000 mg by mouth 3 (three) times daily with meals.    . diazepam (VALIUM) 5 MG tablet Take 1 tablet (5 mg total) by mouth at bedtime. 5 tablet 0  . diclofenac sodium (VOLTAREN) 1 % GEL Apply topically 4 (four) times daily.    Marland Kitchen DIMETHICONE, TOPICAL, 5 % CREA Apply 1 application topically See admin instructions. Place a nickel-sized amount onto buttock(s) area 2 times a day for barrier protection    . hydrALAZINE (APRESOLINE) 10 MG tablet Take 5 mg by mouth 3 (three) times daily as needed (hypertension).    Marland Kitchen loperamide (IMODIUM A-D) 2 MG tablet Take 2 mg by mouth 3 (three) times daily as needed (for diarrhea).     .  loratadine (CLARITIN) 10 MG tablet Take 10 mg by mouth daily.    . meclizine (ANTIVERT) 25 MG tablet Take 25 mg by mouth 3 (three) times daily as needed for dizziness.    . Melatonin 3 MG TABS Take 6 mg by mouth at bedtime.     . midodrine (PROAMATINE) 10 MG tablet Take 10 mg by mouth See admin instructions. Take 10 mg by mouth WITH MORNING MEDS AT FACILITY on Mon/Wed/Fri- dialysis days Take 10 mg by mouth with dialysis on Mon/Wed/Fri    . midodrine (PROAMATINE) 2.5 MG tablet Take 2.5 mg by mouth See admin instructions. 3 times daily on Sun Tues Thurs and Sat , and 2 times daily on Mon Wed and Fri after Dialysis    . OXYGEN Inhale 2 L into the lungs continuous.     Vladimir Faster Glycol-Propyl Glycol (SYSTANE ULTRA) 0.4-0.3 % SOLN Place 1 drop into both eyes 3 (three) times daily.    . promethazine (PHENERGAN) 25 MG tablet Take 1 tablet by mouth every 8 (eight) hours as needed for nausea or vomiting.     . traMADol (ULTRAM) 50 MG tablet Take by mouth every 6 (six) hours as needed.    . traZODone (DESYREL) 50 MG tablet Take 25 mg by mouth at bedtime.      No current  facility-administered medications for this visit.     REVIEW OF SYSTEMS:  [X]  denotes positive finding, [ ]  denotes negative finding Vascular    Leg swelling    Cardiac    Chest pain or chest pressure:    Shortness of breath upon exertion:    Short of breath when lying flat:    Irregular heart rhythm:    Constitutional    Fever or chills:     PHYSICAL EXAM:   Vitals:   12/08/18 1331  BP: 138/69  Pulse: 66  Resp: 20  Temp: (!) 97.5 F (36.4 C)  SpO2: 100%  Weight: 152 lb (68.9 kg)  Height: 5\' 4"  (1.626 m)    GENERAL: The patient is a well-nourished female, in no acute distress. The vital signs are documented above. CARDIOVASCULAR: There is a regular rate and rhythm. PULMONARY: There is good air exchange bilaterally without wheezing or rales. VASCULAR: The patient has an excellent thrill in her right forearm AV  graft.  I cannot palpate a radial pulse however she has a brisk radial and ulnar signal with the Doppler.  She has some bruising around her graft in the forearm where it looks like she may have had an infiltrate.  DATA:   No new data  MEDICAL ISSUES:   END-STAGE RENAL DISEASE: This patient has moderate right arm swelling and a functioning right arm graft.  I have recommended we proceed with a fistulogram to look for an outflow stenosis.  She dialyzes on Monday Wednesdays and Fridays but insist that this be done on a Friday so that I can do the procedure.  She will rearrange her schedule accordingly.  I have discussed the indications of the procedure and the potential complications.  Certainly we find a narrowing in the vein amenable to venoplasty this could potentially be addressed at the same time.  I will make further recommendations pending these results.  Her procedure is scheduled for January 3.  [name] [facility] [phone]

## 2018-12-08 NOTE — Progress Notes (Signed)
Patient name: [name] James MRN: [id number] DOB: 1936-12-13 Sex: female  REASON FOR VISIT:   Persistent swelling after a shuntogram.  The patient was referred by the [facility] kidney center.  HPI:   [name] James is a pleasant 82 y.o. female dialyzes on Monday Wednesdays and Fridays.  She has a right forearm AV graft.  She has been having problems with increasing arm swelling on the right and was sent for evaluation.  To the best of her knowledge the graft has been working well.  However she is had more and more swelling over the last few weeks.  She denies significant pain in the right arm.  She does occasionally have some paresthesias in the hand but this is not new.  Current Outpatient Medications  Medication Sig Dispense Refill  . acetaminophen (TYLENOL) 325 MG tablet Take 650 mg by mouth every 6 (six) hours as needed for fever ("for vomiting/nausea," per [facility]).     Marland Kitchen allopurinol (ZYLOPRIM) 100 MG tablet Take 100 mg by mouth daily.    Marland Kitchen aspirin 81 MG chewable tablet Chew 81 mg by mouth daily.    . B Complex-C-Folic Acid (NEPHRO VITAMINS) 0.8 MG TABS Take 1 tablet by mouth daily.    . Brinzolamide-Brimonidine (SIMBRINZA) 1-0.2 % SUSP Place 1 drop into both eyes 2 (two) times daily.    . calcium elemental as carbonate (BARIATRIC TUMS ULTRA) 400 MG chewable tablet Chew 1,000 mg by mouth 3 (three) times daily with meals.    . diazepam (VALIUM) 5 MG tablet Take 1 tablet (5 mg total) by mouth at bedtime. 5 tablet 0  . diclofenac sodium (VOLTAREN) 1 % GEL Apply topically 4 (four) times daily.    Marland Kitchen DIMETHICONE, TOPICAL, 5 % CREA Apply 1 application topically See admin instructions. Place a nickel-sized amount onto buttock(s) area 2 times a day for barrier protection    . hydrALAZINE (APRESOLINE) 10 MG tablet Take 5 mg by mouth 3 (three) times daily as needed (hypertension).    Marland Kitchen loperamide (IMODIUM A-D) 2 MG tablet Take 2 mg by mouth 3 (three) times daily as needed (for diarrhea).     .  loratadine (CLARITIN) 10 MG tablet Take 10 mg by mouth daily.    . meclizine (ANTIVERT) 25 MG tablet Take 25 mg by mouth 3 (three) times daily as needed for dizziness.    . Melatonin 3 MG TABS Take 6 mg by mouth at bedtime.     . midodrine (PROAMATINE) 10 MG tablet Take 10 mg by mouth See admin instructions. Take 10 mg by mouth WITH MORNING MEDS AT FACILITY on Mon/Wed/Fri- dialysis days Take 10 mg by mouth with dialysis on Mon/Wed/Fri    . midodrine (PROAMATINE) 2.5 MG tablet Take 2.5 mg by mouth See admin instructions. 3 times daily on Sun Tues Thurs and Sat , and 2 times daily on Mon Wed and Fri after Dialysis    . OXYGEN Inhale 2 L into the lungs continuous.     Vladimir Faster Glycol-Propyl Glycol (SYSTANE ULTRA) 0.4-0.3 % SOLN Place 1 drop into both eyes 3 (three) times daily.    . promethazine (PHENERGAN) 25 MG tablet Take 1 tablet by mouth every 8 (eight) hours as needed for nausea or vomiting.     . traMADol (ULTRAM) 50 MG tablet Take by mouth every 6 (six) hours as needed.    . traZODone (DESYREL) 50 MG tablet Take 25 mg by mouth at bedtime.      No current  facility-administered medications for this visit.     REVIEW OF SYSTEMS:  [X]  denotes positive finding, [ ]  denotes negative finding Vascular    Leg swelling    Cardiac    Chest pain or chest pressure:    Shortness of breath upon exertion:    Short of breath when lying flat:    Irregular heart rhythm:    Constitutional    Fever or chills:     PHYSICAL EXAM:   Vitals:   12/08/18 1331  BP: 138/69  Pulse: 66  Resp: 20  Temp: (!) 97.5 F (36.4 C)  SpO2: 100%  Weight: 152 lb (68.9 kg)  Height: 5\' 4"  (1.626 m)    GENERAL: The patient is a well-nourished female, in no acute distress. The vital signs are documented above. CARDIOVASCULAR: There is a regular rate and rhythm. PULMONARY: There is good air exchange bilaterally without wheezing or rales. VASCULAR: The patient has an excellent thrill in her right forearm AV  graft.  I cannot palpate a radial pulse however she has a brisk radial and ulnar signal with the Doppler.  She has some bruising around her graft in the forearm where it looks like she may have had an infiltrate.  DATA:   No new data  MEDICAL ISSUES:   END-STAGE RENAL DISEASE: This patient has moderate right arm swelling and a functioning right arm graft.  I have recommended we proceed with a fistulogram to look for an outflow stenosis.  She dialyzes on Monday Wednesdays and Fridays but insist that this be done on a Friday so that I can do the procedure.  She will rearrange her schedule accordingly.  I have discussed the indications of the procedure and the potential complications.  Certainly we find a narrowing in the vein amenable to venoplasty this could potentially be addressed at the same time.  I will make further recommendations pending these results.  Her procedure is scheduled for January 3.  [name] [facility] [phone]

## 2018-12-09 ENCOUNTER — Ambulatory Visit (INDEPENDENT_AMBULATORY_CARE_PROVIDER_SITE_OTHER): Payer: Medicare Other

## 2018-12-09 ENCOUNTER — Encounter (INDEPENDENT_AMBULATORY_CARE_PROVIDER_SITE_OTHER): Payer: Self-pay | Admitting: Physician Assistant

## 2018-12-09 ENCOUNTER — Ambulatory Visit (INDEPENDENT_AMBULATORY_CARE_PROVIDER_SITE_OTHER): Payer: Medicare Other | Admitting: Physician Assistant

## 2018-12-09 VITALS — Ht 64.0 in | Wt 152.0 lb

## 2018-12-09 DIAGNOSIS — B351 Tinea unguium: Secondary | ICD-10-CM

## 2018-12-09 DIAGNOSIS — Z794 Long term (current) use of insulin: Secondary | ICD-10-CM

## 2018-12-09 DIAGNOSIS — R498 Other voice and resonance disorders: Secondary | ICD-10-CM | POA: Diagnosis not present

## 2018-12-09 DIAGNOSIS — M25561 Pain in right knee: Secondary | ICD-10-CM

## 2018-12-09 DIAGNOSIS — E1140 Type 2 diabetes mellitus with diabetic neuropathy, unspecified: Secondary | ICD-10-CM

## 2018-12-09 DIAGNOSIS — E1129 Type 2 diabetes mellitus with other diabetic kidney complication: Secondary | ICD-10-CM | POA: Diagnosis not present

## 2018-12-09 DIAGNOSIS — I959 Hypotension, unspecified: Secondary | ICD-10-CM | POA: Diagnosis not present

## 2018-12-09 DIAGNOSIS — R001 Bradycardia, unspecified: Secondary | ICD-10-CM | POA: Diagnosis not present

## 2018-12-09 DIAGNOSIS — I480 Paroxysmal atrial fibrillation: Secondary | ICD-10-CM | POA: Diagnosis not present

## 2018-12-09 DIAGNOSIS — M6281 Muscle weakness (generalized): Secondary | ICD-10-CM | POA: Diagnosis not present

## 2018-12-09 NOTE — Progress Notes (Addendum)
Office Visit Note   Patient: [name] James           Date of Birth: [birth year]           MRN: [id number] Visit Date: 12/09/2018              Requested by: [place] [address] [place], [place] [address] PCP: [facility], [facility]  Chief Complaint  Patient presents with  . Right Knee - Pain      HPI: The patient is an 83 year old woman who is a resident of [facility].  She presents in a wheelchair.  She reports she has not been walking for at least 4 years.  She reports about 2 weeks ago when she was transferring from the wheelchair that her right foot dropped hard against the floor and her right knee has been hurting ever since then.  She is tried lidocaine patches and gel and reports that this is helping some.  She would like this evaluated as well as to have her nails trimmed today.  Assessment & Plan: Visit Diagnoses:  1. Acute pain of right knee   2. Onychomycosis of toenail   3. Type 2 diabetes mellitus with diabetic neuropathy, with long-term current use of insulin (HCC)     Plan: Counseled the patient that we do not see any acute fractures about the knee but she does have severe osteopenia and this may make it difficult to identify a fracture.  She is going to continue to utilize lidocaine patches and gel. Also performed right knee steroid injection this visit.   Nails were trimmed x10. She will follow-up in 2 months or sooner should she have difficulties in the interim. Follow-Up Instructions: Return in about 2 months (around 02/09/2019).   Ortho Exam  Patient is alert, oriented, no adenopathy, well-dressed, normal affect, normal respiratory effort. The patient is a chronically ill-appearing elderly female who presents in the wheelchair.  She reports right knee pain.  Bilateral knees have some flexion contracture and she has pain with range of motion but no effusion no bruising.  There is no clear instability of the knee or foot but she has very limited functional use of  both lower extremities with severe deficits in motor strength.  Bilateral toe nails were trimmed x10.  Imaging: No results found. No images are attached to the encounter.  Labs: Lab Results  Component Value Date   HGBA1C 4.4 (L) 08/24/2018   HGBA1C 5.7 (H) 01/20/2017   HGBA1C 5.5 10/22/2016   ESRSEDRATE 64 (H) 07/17/2006   REPTSTATUS 08/28/2018 FINAL 08/23/2018   GRAMSTAIN  10/13/2017    CYTOSPIN SMEAR WBC PRESENT,BOTH PMN AND MONONUCLEAR NO ORGANISMS SEEN Performed at [facility], [place] [address] [place], [place] [address]    CULT  08/23/2018    NO GROWTH 5 DAYS Performed at [facility] [address] [place],  [address]    LABORGA METHICILLIN RESISTANT STAPHYLOCOCCUS AUREUS 01/24/2017     Lab Results  Component Value Date   ALBUMIN 3.2 (L) 08/23/2018   ALBUMIN 2.6 (L) 07/07/2018   ALBUMIN 1.5 (L) 02/02/2017    Body mass index is 26.09 kg/m.  Orders:  Orders Placed This Encounter  Procedures  . XR Knee 1-2 Views Right   No orders of the defined types were placed in this encounter.    Procedures: Large Joint Inj: R knee on 12/13/2018 2:46 PM Indications: pain and diagnostic evaluation Details: 22 G 1.5 in needle, anteromedial approach  Arthrogram: No  Medications: 5 mL lidocaine 1 %; 40 mg methylPREDNISolone acetate 40 MG/ML Outcome: tolerated well, no immediate complications Procedure, treatment alternatives, risks and benefits explained, specific risks discussed. Consent was given by the patient. Immediately prior to procedure a time out was called to verify the correct patient, procedure, equipment, support staff and site/side marked as required. Patient was prepped and draped in the usual sterile fashion.      Clinical Data: No additional findings.  ROS:  All other systems negative, except as noted in the HPI. Review of Systems  Objective: Vital Signs: Ht _0  (1.626 m)   Wt 152 lb (68.9 kg)   BMI 26.09 kg/m    Specialty Comments:  No specialty comments available.  PMFS History: Patient Active Problem List   Diagnosis Date Noted  . Junctional bradycardia 08/25/2018  . SA node dysfunction ([place])   . Symptomatic bradycardia 08/24/2018  . Gout 08/23/2018  . Anxiety 08/23/2018  . Pneumothorax, left 10/13/2017  . Multiple lung nodules on CT 10/06/2017  . Pleural effusion, left 10/06/2017  . Diabetic polyneuropathy associated with type 2 diabetes mellitus ([place]) 07/30/2017  . Onychomycosis of toenail 07/30/2017  . Idiopathic chronic venous hypertension of both lower extremities with inflammation 07/30/2017  . Chronic respiratory failure with hypoxia ([place]) 04/29/2017  . Morbid (severe) obesity due to excess calories ([place]) 04/29/2017  . LFT elevation   . Advance care planning   . Goals of care, counseling/discussion   . Palliative care by specialist   . Pressure ulcer 01/19/2017  . Long term current use of amiodarone 10/17/2016  . History of pressure ulcer 10/17/2016  . GAD (generalized anxiety disorder) 10/17/2016  . Osteopenia determined by x-ray 04/17/2016  . Controlled type 2 diabetes mellitus with diabetic polyneuropathy, with long-term current use of insulin ([place]) 04/17/2016  . Type 2 diabetes mellitus with diabetic neuropathy ([place]) 06/08/2015  . Type 2 diabetes, controlled, with renal manifestation ([place]) 06/08/2015  . PVD (peripheral vascular disease) ([place]) 06/08/2015  . Swelling of limb-Left arm 04/10/2015  . Nausea with vomiting 01/12/2015  . Normocytic anemia 01/12/2015  . Paroxysmal atrial fibrillation ([place]) 12/22/2014  . Melena   . ESRD on dialysis ([place]) 12/19/2014  . Chronic diastolic (congestive) heart failure ([place]) 12/19/2014  . Palpitation 12/19/2014  . Anemia, chronic disease 12/13/2014  . Pulmonary edema 11/10/2014  . Physical deconditioning 10/29/2014  . Iron deficiency anemia   . Gastritis and gastroduodenitis 10/24/2014  . Heme positive stool 10/24/2014  . Morbid  obesity ([place]) 10/18/2014  . Edema 10/18/2014  . Essential hypertension 10/17/2014  . DM type 2, uncontrolled, with renal complications ([place]) 37/62/8315  . Dyslipidemia 10/17/2014   Past Medical History:  Diagnosis Date  . Anemia, chronic disease   . Anxiety   . Arthritis    knees  . Atrial fibrillation ([place])   . Barrett esophagus   . Benign paroxysmal positional vertigo   . Breast cancer ([place]) 1980s   bil mastectomies, no radiation or chemo  . Cataract   . Chest pain, atypical 12/05/2008   R/Lmv- normal perfusion all regions, noe ECG changes   . CHF (congestive heart failure) ([place]) 05/13/2011   echo - VV>61%; stage 1 diastolic dysfunction; elevated LV filling pressure, MAC  . CKD (chronic kidney disease), stage III ([place])   . Claudication ([place]) 10/30/2005   doppler - normal evaluation, no evidence of aneurysm, diameter reduction, dissection, compression or vascular abnormality)  . Complication of anesthesia   . Dyslipidemia   .  Dysrhythmia    Atrial Fibrillation  . Edema   . ESRD (end stage renal disease) on dialysis ([place])    "[facility]; [address]; MWF" (07/06/2018)  . Family history of adverse reaction to anesthesia    son also has nausea   . Fever blister 12/2016  . Gastritis and gastroduodenitis   . GERD (gastroesophageal reflux disease)   . Gout   . Hemodialysis-associated hypotension   . HLD (hyperlipidemia)   . Hypertension   . IBS (irritable bowel syndrome)   . Insomnia   . Morbid obesity ([place])   . On home oxygen therapy    "2L; 24/7" (07/06/2018)  . PAF (paroxysmal atrial fibrillation) ([place])   . Peripheral neuropathy   . Pneumonia 12/2016  . PONV (postoperative nausea and vomiting)   . Renal cyst   . Renal insufficiency 10/30/2005   doppler - abn resistance consistent w/ parenchymal disease  . Renovascular hypertension   . Type 2 diabetes mellitus with diabetic neuropathy, with long-term current use of insulin ([place])   . Vertigo     Family History  Problem  Relation Age of Onset  . Diabetes Mother   . Heart disease Mother   . Non-Hodgkin's lymphoma Mother   . Heart attack Father   . Stroke Father   . Parkinson's disease Brother   . Lung disease Brother   . Heart disease Brother   . Multiple myeloma Sister     Past Surgical History:  Procedure Laterality Date  . A/V FISTULAGRAM Left 03/19/2018   Procedure: A/V FISTULAGRAM;  Surgeon: Angelia Mould, MD;  Location: [facility];  Service: Cardiovascular;  Laterality: Left;  . A/V SHUNTOGRAM Left 01/19/2018   Procedure: A/V SHUNTOGRAM;  Surgeon: Serafina Mitchell, MD;  Location: [facility];  Service: Cardiovascular;  Laterality: Left;  . ANGIOPLASTY Left 02/12/2015   Procedure: ANGIOPLASTY;  Surgeon: Angelia Mould, MD;  Location: [facility] C[facility] LAB;  Service: Cardiovascular;  Laterality: Left;  AVF  . AV FISTULA PLACEMENT Left 11/30/2014   Procedure: ARTERIOVENOUS (AV) FISTULA CREATION LEFT ARM;  Surgeon: Angelia Mould, MD;  Location: [facility];  Service: Vascular;  Laterality: Left;  . AV FISTULA PLACEMENT Left 03/20/2015   Procedure:  Inserton of Left Upper Arm Gortex Graft;  Surgeon: Angelia Mould, MD;  Location: [facility];  Service: Vascular;  Laterality: Left;  . AV FISTULA PLACEMENT Left 04/12/2015   Procedure: INSERTION OF LEFT ARM  ARTERIOVENOUS GORE-TEX GRAFT ;  Surgeon: Angelia Mould, MD;  Location: [facility];  Service: Vascular;  Laterality: Left;  . AV FISTULA PLACEMENT Right 06/08/2018   Procedure: INSERTION OF ARTERIOVENOUS (AV) GORE-TEX GRAFT ARM USING 4-7MM X 45CM GORETEX GRAFT;  Surgeon: Angelia Mould, MD;  Location: [facility];  Service: Vascular;  Laterality: Right;  . BACK SURGERY  ~1980  . CATARACT EXTRACTION Right   . CHOLECYSTECTOMY    . ESOPHAGOGASTRODUODENOSCOPY N/A 10/24/2014   Procedure: ESOPHAGOGASTRODUODENOSCOPY (EGD);  Surgeon: Jerene Bears, MD;  Location[facility] ENDOSCOPY;  Service: Endoscopy;  Laterality: N/A;  . EXCHANGE OF A  DIALYSIS CATHETER Left 11/30/2014   Procedure: EXCHANGE OF A DIALYSIS CATHETER, LEFT INTERNAL JUGULAR;  Surgeon: Angelia Mould, MD;  Location: [facility];  Service: Vascular;  Laterality: Left;  . EYE SURGERY Left    transplant  . FISTULOGRAM N/A 02/12/2015   Procedure: FISTULOGRAM;  Surgeon: Angelia Mould, MD;  Location: [facility] CATH LAB;  Service: Cardiovascular;  Laterality: N/A;  . HEMORRHOID SURGERY    .  IR AV DIALY SHUNT INTRO NEEDLE/INTRACATH INITIAL W/PTA/IMG RIGHT Right 08/25/2018  . IR AV DIALY SHUNT INTRO NEEDLE/INTRACATH INITIAL W/PTA/IMG RIGHT Right 11/17/2018  . IR US GUIDE VASC ACCESS RIGHT  08/25/2018  . LIGATION ARTERIOVENOUS GORTEX GRAFT Left 08/27/2018   Procedure: LIGATION ARTERIOVENOUS GORTEX GRAFT ARM;  Surgeon: Angelia Mould, MD;  Location: [facility];  Service: Vascular;  Laterality: Left;  . LIGATION OF ARTERIOVENOUS  FISTULA Left 03/20/2015   Procedure: LIGATION OF ARTERIOVENOUS  FISTULA;  Surgeon: Angelia Mould, MD;  Location: [facility];  Service: Vascular;  Laterality: Left;  Marland Kitchen MASTECTOMY Bilateral ~1982   bil breast reconstruction with implants  . ORIF TIBIA & FIBULA FRACTURES Left 2007   also had left non displaced malleolar fracture.   Marland Kitchen PERIPHERAL VASCULAR BALLOON ANGIOPLASTY Left 01/19/2018   Procedure: PERIPHERAL VASCULAR BALLOON ANGIOPLASTY;  Surgeon: Serafina Mitchell, MD;  Location: [facility];  Service: Cardiovascular;  Laterality: Left;  fistula  . PERIPHERAL VASCULAR BALLOON ANGIOPLASTY Left 03/19/2018   Procedure: PERIPHERAL VASCULAR BALLOON ANGIOPLASTY;  Surgeon: Angelia Mould, MD;  Location: [facility];  Service: Cardiovascular;  Laterality: Left;  UPPER ARM FISTULA  . REMOVAL OF GRAFT Left 04/12/2015   Procedure: REMOVAL OF LEFT ARM ARTERIOVENOUS GORE-TEX GRAFT;  Surgeon: Angelia Mould, MD;  Location: [facility] OR;  Service: Vascular;  Laterality: Left;   Social History   Occupational History  . Not on file  Tobacco  Use  . Smoking status: Never Smoker  . Smokeless tobacco: Never Used  Substance and Sexual Activity  . Alcohol use: No  . Drug use: No  . Sexual activity: Not on file

## 2018-12-10 DIAGNOSIS — E1122 Type 2 diabetes mellitus with diabetic chronic kidney disease: Secondary | ICD-10-CM | POA: Diagnosis not present

## 2018-12-10 DIAGNOSIS — E211 Secondary hyperparathyroidism, not elsewhere classified: Secondary | ICD-10-CM | POA: Diagnosis not present

## 2018-12-10 DIAGNOSIS — M25561 Pain in right knee: Secondary | ICD-10-CM | POA: Diagnosis not present

## 2018-12-10 DIAGNOSIS — D509 Iron deficiency anemia, unspecified: Secondary | ICD-10-CM | POA: Diagnosis not present

## 2018-12-10 DIAGNOSIS — T1490XD Injury, unspecified, subsequent encounter: Secondary | ICD-10-CM | POA: Diagnosis not present

## 2018-12-10 DIAGNOSIS — D631 Anemia in chronic kidney disease: Secondary | ICD-10-CM | POA: Diagnosis not present

## 2018-12-10 DIAGNOSIS — N2581 Secondary hyperparathyroidism of renal origin: Secondary | ICD-10-CM | POA: Diagnosis not present

## 2018-12-10 DIAGNOSIS — N186 End stage renal disease: Secondary | ICD-10-CM | POA: Diagnosis not present

## 2018-12-12 DIAGNOSIS — N2581 Secondary hyperparathyroidism of renal origin: Secondary | ICD-10-CM | POA: Diagnosis not present

## 2018-12-12 DIAGNOSIS — T1490XD Injury, unspecified, subsequent encounter: Secondary | ICD-10-CM | POA: Diagnosis not present

## 2018-12-12 DIAGNOSIS — E1122 Type 2 diabetes mellitus with diabetic chronic kidney disease: Secondary | ICD-10-CM | POA: Diagnosis not present

## 2018-12-12 DIAGNOSIS — D631 Anemia in chronic kidney disease: Secondary | ICD-10-CM | POA: Diagnosis not present

## 2018-12-12 DIAGNOSIS — N186 End stage renal disease: Secondary | ICD-10-CM | POA: Diagnosis not present

## 2018-12-12 DIAGNOSIS — M25561 Pain in right knee: Secondary | ICD-10-CM | POA: Diagnosis not present

## 2018-12-12 DIAGNOSIS — E211 Secondary hyperparathyroidism, not elsewhere classified: Secondary | ICD-10-CM | POA: Diagnosis not present

## 2018-12-12 DIAGNOSIS — D509 Iron deficiency anemia, unspecified: Secondary | ICD-10-CM | POA: Diagnosis not present

## 2018-12-13 ENCOUNTER — Encounter (INDEPENDENT_AMBULATORY_CARE_PROVIDER_SITE_OTHER): Payer: Self-pay | Admitting: Physician Assistant

## 2018-12-13 ENCOUNTER — Telehealth (INDEPENDENT_AMBULATORY_CARE_PROVIDER_SITE_OTHER): Payer: Self-pay

## 2018-12-13 DIAGNOSIS — M25561 Pain in right knee: Secondary | ICD-10-CM | POA: Diagnosis not present

## 2018-12-13 MED ORDER — LIDOCAINE HCL 1 % IJ SOLN
5.0000 mL | INTRAMUSCULAR | Status: AC | PRN
  Administered 2018-12-13: 5 mL

## 2018-12-13 MED ORDER — METHYLPREDNISOLONE ACETATE 40 MG/ML IJ SUSP
40.0000 mg | INTRAMUSCULAR | Status: AC | PRN
  Administered 2018-12-13: 40 mg via INTRA_ARTICULAR

## 2018-12-13 NOTE — Telephone Encounter (Signed)
Patient called stating that she is having severe pain in her right knee and would like a call back form Shawn.  Cb# is [phone].  Please advise.  Thank You.

## 2018-12-13 NOTE — Telephone Encounter (Signed)
I called and sw pt and she states that the injection that she had last Wednesday is not helping. States that the tramadol that she is taking and the lidocaine patch and the tylenol that she takes does not help at all either. Is asking if there is something else that she can take to help with pain.

## 2018-12-13 NOTE — Telephone Encounter (Signed)
Can you please make an appt for this pt to see Dr. Sharol Given on Monday at 2pm. I called and advised pt of below and she will hold and wait to see Dr. Sharol Given on Monday.

## 2018-12-13 NOTE — Telephone Encounter (Signed)
Can come back in and see Dr. Sharol Given or may need to go to the ED if pain unable to be controlled.

## 2018-12-14 DIAGNOSIS — D509 Iron deficiency anemia, unspecified: Secondary | ICD-10-CM | POA: Diagnosis not present

## 2018-12-14 DIAGNOSIS — D631 Anemia in chronic kidney disease: Secondary | ICD-10-CM | POA: Diagnosis not present

## 2018-12-14 DIAGNOSIS — N186 End stage renal disease: Secondary | ICD-10-CM | POA: Diagnosis not present

## 2018-12-14 DIAGNOSIS — E211 Secondary hyperparathyroidism, not elsewhere classified: Secondary | ICD-10-CM | POA: Diagnosis not present

## 2018-12-14 DIAGNOSIS — N2581 Secondary hyperparathyroidism of renal origin: Secondary | ICD-10-CM | POA: Diagnosis not present

## 2018-12-14 DIAGNOSIS — E1122 Type 2 diabetes mellitus with diabetic chronic kidney disease: Secondary | ICD-10-CM | POA: Diagnosis not present

## 2018-12-16 DIAGNOSIS — M6281 Muscle weakness (generalized): Secondary | ICD-10-CM | POA: Diagnosis not present

## 2018-12-16 DIAGNOSIS — I959 Hypotension, unspecified: Secondary | ICD-10-CM | POA: Diagnosis not present

## 2018-12-16 DIAGNOSIS — E1129 Type 2 diabetes mellitus with other diabetic kidney complication: Secondary | ICD-10-CM | POA: Diagnosis not present

## 2018-12-16 DIAGNOSIS — R001 Bradycardia, unspecified: Secondary | ICD-10-CM | POA: Diagnosis not present

## 2018-12-16 DIAGNOSIS — I480 Paroxysmal atrial fibrillation: Secondary | ICD-10-CM | POA: Diagnosis not present

## 2018-12-16 DIAGNOSIS — R498 Other voice and resonance disorders: Secondary | ICD-10-CM | POA: Diagnosis not present

## 2018-12-17 DIAGNOSIS — D509 Iron deficiency anemia, unspecified: Secondary | ICD-10-CM | POA: Diagnosis not present

## 2018-12-17 DIAGNOSIS — N2581 Secondary hyperparathyroidism of renal origin: Secondary | ICD-10-CM | POA: Diagnosis not present

## 2018-12-17 DIAGNOSIS — D631 Anemia in chronic kidney disease: Secondary | ICD-10-CM | POA: Diagnosis not present

## 2018-12-17 DIAGNOSIS — N186 End stage renal disease: Secondary | ICD-10-CM | POA: Diagnosis not present

## 2018-12-17 DIAGNOSIS — E1122 Type 2 diabetes mellitus with diabetic chronic kidney disease: Secondary | ICD-10-CM | POA: Diagnosis not present

## 2018-12-17 DIAGNOSIS — E211 Secondary hyperparathyroidism, not elsewhere classified: Secondary | ICD-10-CM | POA: Diagnosis not present

## 2018-12-18 DIAGNOSIS — E1129 Type 2 diabetes mellitus with other diabetic kidney complication: Secondary | ICD-10-CM | POA: Diagnosis not present

## 2018-12-18 DIAGNOSIS — R498 Other voice and resonance disorders: Secondary | ICD-10-CM | POA: Diagnosis not present

## 2018-12-18 DIAGNOSIS — I480 Paroxysmal atrial fibrillation: Secondary | ICD-10-CM | POA: Diagnosis not present

## 2018-12-18 DIAGNOSIS — M6281 Muscle weakness (generalized): Secondary | ICD-10-CM | POA: Diagnosis not present

## 2018-12-18 DIAGNOSIS — I959 Hypotension, unspecified: Secondary | ICD-10-CM | POA: Diagnosis not present

## 2018-12-18 DIAGNOSIS — R001 Bradycardia, unspecified: Secondary | ICD-10-CM | POA: Diagnosis not present

## 2018-12-19 DIAGNOSIS — D509 Iron deficiency anemia, unspecified: Secondary | ICD-10-CM | POA: Diagnosis not present

## 2018-12-19 DIAGNOSIS — E211 Secondary hyperparathyroidism, not elsewhere classified: Secondary | ICD-10-CM | POA: Diagnosis not present

## 2018-12-19 DIAGNOSIS — N2581 Secondary hyperparathyroidism of renal origin: Secondary | ICD-10-CM | POA: Diagnosis not present

## 2018-12-19 DIAGNOSIS — N186 End stage renal disease: Secondary | ICD-10-CM | POA: Diagnosis not present

## 2018-12-19 DIAGNOSIS — D631 Anemia in chronic kidney disease: Secondary | ICD-10-CM | POA: Diagnosis not present

## 2018-12-19 DIAGNOSIS — E1122 Type 2 diabetes mellitus with diabetic chronic kidney disease: Secondary | ICD-10-CM | POA: Diagnosis not present

## 2018-12-20 ENCOUNTER — Ambulatory Visit (INDEPENDENT_AMBULATORY_CARE_PROVIDER_SITE_OTHER): Payer: Medicare Other | Admitting: Orthopedic Surgery

## 2018-12-20 ENCOUNTER — Encounter (INDEPENDENT_AMBULATORY_CARE_PROVIDER_SITE_OTHER): Payer: Self-pay | Admitting: Orthopedic Surgery

## 2018-12-20 VITALS — Ht 64.0 in | Wt 152.0 lb

## 2018-12-20 DIAGNOSIS — I87323 Chronic venous hypertension (idiopathic) with inflammation of bilateral lower extremity: Secondary | ICD-10-CM

## 2018-12-20 DIAGNOSIS — M25561 Pain in right knee: Secondary | ICD-10-CM | POA: Diagnosis not present

## 2018-12-20 DIAGNOSIS — T1490XA Injury, unspecified, initial encounter: Secondary | ICD-10-CM | POA: Diagnosis not present

## 2018-12-20 DIAGNOSIS — E1140 Type 2 diabetes mellitus with diabetic neuropathy, unspecified: Secondary | ICD-10-CM

## 2018-12-20 DIAGNOSIS — Z794 Long term (current) use of insulin: Secondary | ICD-10-CM

## 2018-12-21 DIAGNOSIS — E1122 Type 2 diabetes mellitus with diabetic chronic kidney disease: Secondary | ICD-10-CM | POA: Diagnosis not present

## 2018-12-21 DIAGNOSIS — D509 Iron deficiency anemia, unspecified: Secondary | ICD-10-CM | POA: Diagnosis not present

## 2018-12-21 DIAGNOSIS — N186 End stage renal disease: Secondary | ICD-10-CM | POA: Diagnosis not present

## 2018-12-21 DIAGNOSIS — D631 Anemia in chronic kidney disease: Secondary | ICD-10-CM | POA: Diagnosis not present

## 2018-12-21 DIAGNOSIS — E211 Secondary hyperparathyroidism, not elsewhere classified: Secondary | ICD-10-CM | POA: Diagnosis not present

## 2018-12-21 DIAGNOSIS — N2581 Secondary hyperparathyroidism of renal origin: Secondary | ICD-10-CM | POA: Diagnosis not present

## 2018-12-22 ENCOUNTER — Encounter (INDEPENDENT_AMBULATORY_CARE_PROVIDER_SITE_OTHER): Payer: Self-pay | Admitting: Orthopedic Surgery

## 2018-12-22 DIAGNOSIS — M25561 Pain in right knee: Secondary | ICD-10-CM

## 2018-12-22 DIAGNOSIS — N186 End stage renal disease: Secondary | ICD-10-CM | POA: Diagnosis not present

## 2018-12-22 DIAGNOSIS — E1122 Type 2 diabetes mellitus with diabetic chronic kidney disease: Secondary | ICD-10-CM | POA: Diagnosis not present

## 2018-12-22 DIAGNOSIS — Z992 Dependence on renal dialysis: Secondary | ICD-10-CM | POA: Diagnosis not present

## 2018-12-22 MED ORDER — LIDOCAINE HCL 1 % IJ SOLN
5.0000 mL | INTRAMUSCULAR | Status: AC | PRN
  Administered 2018-12-22: 5 mL

## 2018-12-22 MED ORDER — METHYLPREDNISOLONE ACETATE 40 MG/ML IJ SUSP
40.0000 mg | INTRAMUSCULAR | Status: AC | PRN
  Administered 2018-12-22: 40 mg via INTRA_ARTICULAR

## 2018-12-22 NOTE — Progress Notes (Signed)
Office Visit Note   Patient: [name] James           Date of Birth: Jul 05, 1936           MRN: [id number] Visit Date: 12/20/2018              Requested by: Patrick Jupiter [address] [place], [place] [address] PCP: [facility]  Chief Complaint  Patient presents with  . Right Knee - Pain      HPI: Patient is an 83 year old woman with osteoarthritis of her right knee.  Patient states the tramadol has not been helping or a lidocaine patch helping.  She is currently on dialysis with type 2 diabetes.  She complains of right knee pain at all times.   Assessment & Plan: Visit Diagnoses:  1. Acute pain of right knee   2. Type 2 diabetes mellitus with diabetic neuropathy, with long-term current use of insulin ([place])   3. Idiopathic chronic venous hypertension of both lower extremities with inflammation     Plan: Right knee was injected from the anterior medial portal without complications.  Follow-up as needed.  Follow-Up Instructions: Return in about 4 weeks (around 01/17/2019).   Ortho Exam  Patient is alert, oriented, no adenopathy, well-dressed, normal affect, normal respiratory effort. Examination patient has no redness no swelling no effusion of the right knee collaterals and cruciates are stable no signs of gout no signs of infection.  There is crepitation with range of motion of the knee.  After informed consent the knee was injected without complications.  Imaging: No results found. No images are attached to the encounter.  Labs: Lab Results  Component Value Date   HGBA1C 4.4 (L) 08/24/2018   HGBA1C 5.7 (H) 01/20/2017   HGBA1C 5.5 10/22/2016   ESRSEDRATE 64 (H) 07/17/2006   REPTSTATUS 08/28/2018 FINAL 08/23/2018   GRAMSTAIN  10/13/2017    CYTOSPIN SMEAR WBC PRESENT,BOTH PMN AND MONONUCLEAR NO ORGANISMS SEEN Performed at [facility], [place] [address], [place], [place] [address]    CULT  08/23/2018    NO GROWTH 5 DAYS Performed at [facility] [address], [place],  [address]    LABORGA METHICILLIN RESISTANT STAPHYLOCOCCUS AUREUS 01/24/2017     Lab Results  Component Value Date   ALBUMIN 3.2 (L) 08/23/2018   ALBUMIN 2.6 (L) 07/07/2018   ALBUMIN 1.5 (L) 02/02/2017    Body mass index is 26.09 kg/m.  Orders:  No orders of the defined types were placed in this encounter.  No orders of the defined types were placed in this encounter.    Procedures: Large Joint Inj: R knee on 12/22/2018 10:37 AM Indications: pain and diagnostic evaluation Details: 22 G 1.5 in needle, anteromedial approach  Arthrogram: No  Medications: 5 mL lidocaine 1 %; 40 mg methylPREDNISolone acetate 40 MG/ML Outcome: tolerated well, no immediate complications Procedure, treatment alternatives, risks and benefits explained, specific risks discussed. Consent was given by the patient. Immediately prior to procedure a time out was called to verify the correct patient, procedure, equipment, support staff and site/side marked as required. Patient was prepped and draped in the usual sterile fashion.      Clinical Data: No additional findings.  ROS:  All other systems negative, except as noted in the HPI. Review of Systems  Objective: Vital Signs: Ht _0  (1.626 m)   Wt 152 lb (68.9 kg)   BMI 26.09 kg/m   Specialty Comments:  No specialty comments available.  PMFS History: Patient Active Problem List   Diagnosis Date Noted  . Junctional bradycardia 08/25/2018  . SA node dysfunction ([place])   . Symptomatic bradycardia 08/24/2018  . Gout 08/23/2018  . Anxiety 08/23/2018  . Pneumothorax, left 10/13/2017  . Multiple lung nodules on CT 10/06/2017  . Pleural effusion, left 10/06/2017  . Diabetic polyneuropathy associated with type 2 diabetes mellitus ([place]) 07/30/2017  . Onychomycosis of toenail 07/30/2017  . Idiopathic chronic venous hypertension of both lower extremities with inflammation 07/30/2017  . Chronic respiratory failure with  hypoxia ([place]) 04/29/2017  . Morbid (severe) obesity due to excess calories ([place]) 04/29/2017  . LFT elevation   . Advance care planning   . Goals of care, counseling/discussion   . Palliative care by specialist   . Pressure ulcer 01/19/2017  . Long term current use of amiodarone 10/17/2016  . History of pressure ulcer 10/17/2016  . GAD (generalized anxiety disorder) 10/17/2016  . Osteopenia determined by x-ray 04/17/2016  . Controlled type 2 diabetes mellitus with diabetic polyneuropathy, with long-term current use of insulin ([place]) 04/17/2016  . Type 2 diabetes mellitus with diabetic neuropathy ([place]) 06/08/2015  . Type 2 diabetes, controlled, with renal manifestation ([place]) 06/08/2015  . PVD (peripheral vascular disease) ([place]) 06/08/2015  . Swelling of limb-Left arm 04/10/2015  . Nausea with vomiting 01/12/2015  . Normocytic anemia 01/12/2015  . Paroxysmal atrial fibrillation ([place]) 12/22/2014  . Melena   . ESRD on dialysis ([place]) 12/19/2014  . Chronic diastolic (congestive) heart failure ([place]) 12/19/2014  . Palpitation 12/19/2014  . Anemia, chronic disease 12/13/2014  . Pulmonary edema 11/10/2014  . Physical deconditioning 10/29/2014  . Iron deficiency anemia   . Gastritis and gastroduodenitis 10/24/2014  . Heme positive stool 10/24/2014  . Morbid obesity ([place]) 10/18/2014  . Edema 10/18/2014  . Essential hypertension 10/17/2014  . DM type 2, uncontrolled, with renal complications ([place]) 74/11/8785  . Dyslipidemia 10/17/2014   Past Medical History:  Diagnosis Date  . Anemia, chronic disease   . Anxiety   . Arthritis    knees  . Atrial fibrillation ([place])   . Barrett esophagus   . Benign paroxysmal positional vertigo   . Breast cancer ([place]) 1980s   bil mastectomies, no radiation or chemo  . Cataract   . Chest pain, atypical 12/05/2008   R/Lmv- normal perfusion all regions, noe ECG changes   . CHF (congestive heart failure) ([place]) 05/13/2011   echo - VE>72%; stage 1 diastolic  dysfunction; elevated LV filling pressure, MAC  . CKD (chronic kidney disease), stage III ([place])   . Claudication ([place]) 10/30/2005   doppler - normal evaluation, no evidence of aneurysm, diameter reduction, dissection, compression or vascular abnormality)  . Complication of anesthesia   . Dyslipidemia   . Dysrhythmia    Atrial Fibrillation  . Edema   . ESRD (end stage renal disease) on dialysis ([place])    "[facility]; [address]; MWF" (07/06/2018)  . Family history of adverse reaction to anesthesia    son also has nausea   . Fever blister 12/2016  . Gastritis and gastroduodenitis   . GERD (gastroesophageal reflux disease)   . Gout   . Hemodialysis-associated hypotension   . HLD (hyperlipidemia)   . Hypertension   . IBS (irritable bowel syndrome)   . Insomnia   . Morbid obesity ([place])   . On home oxygen therapy    "2L; 24/7" (07/06/2018)  . PAF (paroxysmal atrial fibrillation) ([place])   . Peripheral neuropathy   . Pneumonia 12/2016  .  PONV (postoperative nausea and vomiting)   . Renal cyst   . Renal insufficiency 10/30/2005   doppler - abn resistance consistent w/ parenchymal disease  . Renovascular hypertension   . Type 2 diabetes mellitus with diabetic neuropathy, with long-term current use of insulin ([place])   . Vertigo     Family History  Problem Relation Age of Onset  . Diabetes Mother   . Heart disease Mother   . Non-Hodgkin's lymphoma Mother   . Heart attack Father   . Stroke Father   . Parkinson's disease Brother   . Lung disease Brother   . Heart disease Brother   . Multiple myeloma Sister     Past Surgical History:  Procedure Laterality Date  . A/V FISTULAGRAM Left 03/19/2018   Procedure: A/V FISTULAGRAM;  Surgeon: Angelia Mould, MD;  Location: [facility];  Service: Cardiovascular;  Laterality: Left;  . A/V SHUNTOGRAM Left 01/19/2018   Procedure: A/V SHUNTOGRAM;  Surgeon: Serafina Mitchell, MD;  Location: [facility];  Service: Cardiovascular;   Laterality: Left;  . ANGIOPLASTY Left 02/12/2015   Procedure: ANGIOPLASTY;  Surgeon: Angelia Mould, MD;  Location: [facility] CATH LAB;  Service: Cardiovascular;  Laterality: Left;  AVF  . AV FISTULA PLACEMENT Left 11/30/2014   Procedure: ARTERIOVENOUS (AV) FISTULA CREATION LEFT ARM;  Surgeon: Angelia Mould, MD;  Location: [facility];  Service: Vascular;  Laterality: Left;  . AV FISTULA PLACEMENT Left 03/20/2015   Procedure:  Inserton of Left Upper Arm Gortex Graft;  Surgeon: Angelia Mould, MD;  Location: [facility];  Service: Vascular;  Laterality: Left;  . AV FISTULA PLACEMENT Left 04/12/2015   Procedure: INSERTION OF LEFT ARM  ARTERIOVENOUS GORE-TEX GRAFT ;  Surgeon: Angelia Mould, MD;  Location: [facility];  Service: Vascular;  Laterality: Left;  . AV FISTULA PLACEMENT Right 06/08/2018   Procedure: INSERTION OF ARTERIOVENOUS (AV) GORE-TEX GRAFT ARM USING 4-7MM X 45CM GORETEX GRAFT;  Surgeon: Angelia Mould, MD;  Location: [facility];  Service: Vascular;  Laterality: Right;  . BACK SURGERY  ~1980  . CATARACT EXTRACTION Right   . CHOLECYSTECTOMY    . ESOPHAGOGASTRODUODENOSCOPY N/A 10/24/2014   Procedure: ESOPHAGOGASTRODUODENOSCOPY (EGD);  Surgeon: Jerene Bears, MD;  [facility] ENDOSCOPY;  Service: Endoscopy;  Laterality: N/A;  . EXCHANGE OF A DIALYSIS CATHETER Left 11/30/2014   Procedure: EXCHANGE OF A DIALYSIS CATHETER, LEFT INTERNAL JUGULAR;  Surgeon: Angelia Mould, MD;  Location: [facility];  Service: Vascular;  Laterality: Left;  . EYE SURGERY Left    transplant  . FISTULOGRAM N/A 02/12/2015   Procedure: FISTULOGRAM;  Surgeon: Angelia Mould, MD;  Location: [facility] LAB;  Service: Cardiovascular;  Laterality: N/A;  . HEMORRHOID SURGERY    . IR AV DIALY SHUNT INTRO NEEDLE/INTRACATH INITIAL W/PTA/IMG RIGHT Right 08/25/2018  . IR AV DIALY SHUNT INTRO NEEDLE/INTRACATH INITIAL W/PTA/IMG RIGHT Right 11/17/2018  . IR US GUIDE VASC ACCESS RIGHT  08/25/2018  . LIGATION  ARTERIOVENOUS GORTEX GRAFT Left 08/27/2018   Procedure: LIGATION ARTERIOVENOUS GORTEX GRAFT ARM;  Surgeon: Angelia Mould, MD;  Location: [facility];  Service: Vascular;  Laterality: Left;  . LIGATION OF ARTERIOVENOUS  FISTULA Left 03/20/2015   Procedure: LIGATION OF ARTERIOVENOUS  FISTULA;  Surgeon: Angelia Mould, MD;  Location: [facility];  Service: Vascular;  Laterality: Left;  Marland Kitchen MASTECTOMY Bilateral ~1982   bil breast reconstruction with implants  . ORIF TIBIA & FIBULA FRACTURES Left 2007   also had left  non displaced malleolar fracture.   Marland Kitchen PERIPHERAL VASCULAR BALLOON ANGIOPLASTY Left 01/19/2018   Procedure: PERIPHERAL VASCULAR BALLOON ANGIOPLASTY;  Surgeon: Serafina Mitchell, MD;  Location: [facility];  Service: Cardiovascular;  Laterality: Left;  fistula  . PERIPHERAL VASCULAR BALLOON ANGIOPLASTY Left 03/19/2018   Procedure: PERIPHERAL VASCULAR BALLOON ANGIOPLASTY;  Surgeon: Angelia Mould, MD;  Location: [facility];  Service: Cardiovascular;  Laterality: Left;  UPPER ARM FISTULA  . REMOVAL OF GRAFT Left 04/12/2015   Procedure: REMOVAL OF LEFT ARM ARTERIOVENOUS GORE-TEX GRAFT;  Surgeon: Angelia Mould, MD;  Location: [facility] OR;  Service: Vascular;  Laterality: Left;   Social History   Occupational History  . Not on file  Tobacco Use  . Smoking status: Never Smoker  . Smokeless tobacco: Never Used  Substance and Sexual Activity  . Alcohol use: No  . Drug use: No  . Sexual activity: Not on file

## 2018-12-23 DIAGNOSIS — D631 Anemia in chronic kidney disease: Secondary | ICD-10-CM | POA: Diagnosis not present

## 2018-12-23 DIAGNOSIS — E211 Secondary hyperparathyroidism, not elsewhere classified: Secondary | ICD-10-CM | POA: Diagnosis not present

## 2018-12-23 DIAGNOSIS — N186 End stage renal disease: Secondary | ICD-10-CM | POA: Diagnosis not present

## 2018-12-23 DIAGNOSIS — D509 Iron deficiency anemia, unspecified: Secondary | ICD-10-CM | POA: Diagnosis not present

## 2018-12-23 DIAGNOSIS — E1122 Type 2 diabetes mellitus with diabetic chronic kidney disease: Secondary | ICD-10-CM | POA: Diagnosis not present

## 2018-12-23 DIAGNOSIS — N2581 Secondary hyperparathyroidism of renal origin: Secondary | ICD-10-CM | POA: Diagnosis not present

## 2018-12-24 ENCOUNTER — Other Ambulatory Visit: Payer: Self-pay

## 2018-12-24 ENCOUNTER — Ambulatory Visit (HOSPITAL_COMMUNITY)
Admission: RE | Admit: 2018-12-24 | Discharge: 2018-12-24 | Disposition: A | Discharge status: Home / Self Care | Payer: Medicare Other | Attending: Vascular Surgery | Admitting: Vascular Surgery

## 2018-12-24 ENCOUNTER — Encounter (HOSPITAL_COMMUNITY): Payer: Self-pay | Admitting: Vascular Surgery

## 2018-12-24 ENCOUNTER — Encounter (HOSPITAL_COMMUNITY)
Admission: RE | Disposition: A | Discharge status: Home / Self Care | Payer: Self-pay | Source: Home / Self Care | Attending: Vascular Surgery

## 2018-12-24 DIAGNOSIS — T82898A Other specified complication of vascular prosthetic devices, implants and grafts, initial encounter: Secondary | ICD-10-CM | POA: Diagnosis not present

## 2018-12-24 DIAGNOSIS — I871 Compression of vein: Secondary | ICD-10-CM | POA: Insufficient documentation

## 2018-12-24 DIAGNOSIS — Y832 Surgical operation with anastomosis, bypass or graft as the cause of abnormal reaction of the patient, or of later complication, without mention of misadventure at the time of the procedure: Secondary | ICD-10-CM | POA: Diagnosis not present

## 2018-12-24 DIAGNOSIS — Z992 Dependence on renal dialysis: Secondary | ICD-10-CM | POA: Diagnosis not present

## 2018-12-24 DIAGNOSIS — M7989 Other specified soft tissue disorders: Secondary | ICD-10-CM | POA: Diagnosis not present

## 2018-12-24 DIAGNOSIS — N186 End stage renal disease: Secondary | ICD-10-CM | POA: Insufficient documentation

## 2018-12-24 DIAGNOSIS — Z7982 Long term (current) use of aspirin: Secondary | ICD-10-CM | POA: Insufficient documentation

## 2018-12-24 HISTORY — PX: A/V FISTULAGRAM: CATH118298

## 2018-12-24 HISTORY — PX: PERIPHERAL VASCULAR BALLOON ANGIOPLASTY: CATH118281

## 2018-12-24 LAB — POCT I-STAT, CHEM 8
BUN: 30 mg/dL — AB (ref 8–23)
CREATININE: 3 mg/dL — AB (ref 0.44–1.00)
Calcium, Ion: 1.06 mmol/L — ABNORMAL LOW (ref 1.15–1.40)
Chloride: 95 mmol/L — ABNORMAL LOW (ref 98–111)
GLUCOSE: 147 mg/dL — AB (ref 70–99)
HCT: 35 % — ABNORMAL LOW (ref 36.0–46.0)
HEMOGLOBIN: 11.9 g/dL — AB (ref 12.0–15.0)
Potassium: 3 mmol/L — ABNORMAL LOW (ref 3.5–5.1)
Sodium: 140 mmol/L (ref 135–145)
TCO2: 33 mmol/L — ABNORMAL HIGH (ref 22–32)

## 2018-12-24 LAB — GLUCOSE, CAPILLARY: Glucose-Capillary: 128 mg/dL — ABNORMAL HIGH (ref 70–99)

## 2018-12-24 SURGERY — A/V FISTULAGRAM
Anesthesia: LOCAL | Laterality: Right

## 2018-12-24 MED ORDER — HEPARIN SODIUM (PORCINE) 1000 UNIT/ML IJ SOLN
INTRAMUSCULAR | Status: DC | PRN
  Administered 2018-12-24: 4000 [IU] via INTRAVENOUS

## 2018-12-24 MED ORDER — LIDOCAINE HCL (PF) 1 % IJ SOLN
INTRAMUSCULAR | Status: AC
  Filled 2018-12-24: qty 30

## 2018-12-24 MED ORDER — SODIUM CHLORIDE 0.9 % IV SOLN: 250.0000 mL | INTRAVENOUS | Status: DC | PRN

## 2018-12-24 MED ORDER — HEPARIN (PORCINE) IN NACL 1000-0.9 UT/500ML-% IV SOLN
INTRAVENOUS | Status: DC | PRN
  Administered 2018-12-24: 500 mL

## 2018-12-24 MED ORDER — LIDOCAINE HCL (PF) 1 % IJ SOLN
INTRAMUSCULAR | Status: DC | PRN
  Administered 2018-12-24: 2 mL

## 2018-12-24 MED ORDER — HEPARIN (PORCINE) IN NACL 1000-0.9 UT/500ML-% IV SOLN
INTRAVENOUS | Status: AC
  Filled 2018-12-24: qty 500

## 2018-12-24 MED ORDER — SODIUM CHLORIDE 0.9% FLUSH: 3.0000 mL | Freq: Two times a day (BID) | INTRAVENOUS | Status: DC

## 2018-12-24 MED ORDER — LABETALOL HCL 5 MG/ML IV SOLN
INTRAVENOUS | Status: AC
  Filled 2018-12-24: qty 4

## 2018-12-24 MED ORDER — IODIXANOL 320 MG/ML IV SOLN
INTRAVENOUS | Status: DC | PRN
  Administered 2018-12-24: 70 mL via INTRAVENOUS

## 2018-12-24 MED ORDER — SODIUM CHLORIDE 0.9% FLUSH: 3.0000 mL | INTRAVENOUS | Status: DC | PRN

## 2018-12-24 MED ORDER — LABETALOL HCL 5 MG/ML IV SOLN
INTRAVENOUS | Status: DC | PRN
  Administered 2018-12-24: 10 mg via INTRAVENOUS

## 2018-12-24 SURGICAL SUPPLY — 16 items
BAG SNAP BAND KOVER 36X36 (MISCELLANEOUS) ×3 IMPLANT
BALLN MUSTANG 5X60X75 (BALLOONS) ×3
BALLN MUSTANG 7X60X75 (BALLOONS) ×3
BALLOON MUSTANG 5X60X75 (BALLOONS) IMPLANT
BALLOON MUSTANG 7X60X75 (BALLOONS) IMPLANT
COVER DOME SNAP 22 D (MISCELLANEOUS) ×3 IMPLANT
KIT ENCORE 26 ADVANTAGE (KITS) ×2 IMPLANT
KIT MICROPUNCTURE NIT STIFF (SHEATH) ×1 IMPLANT
PROTECTION STATION PRESSURIZED (MISCELLANEOUS) ×3
SHEATH PINNACLE R/O II 6F 4CM (SHEATH) ×1 IMPLANT
SHEATH PROBE COVER 6X72 (BAG) ×3 IMPLANT
STATION PROTECTION PRESSURIZED (MISCELLANEOUS) ×2 IMPLANT
STOPCOCK MORSE 400PSI 3WAY (MISCELLANEOUS) ×4 IMPLANT
TRAY PV CATH (CUSTOM PROCEDURE TRAY) ×3 IMPLANT
TUBING CIL FLEX 10 FLL-RA (TUBING) ×4 IMPLANT
WIRE BENTSON .035X145CM (WIRE) ×1 IMPLANT

## 2018-12-24 NOTE — Interval H&P Note (Signed)
History and Physical Interval Note:  12/24/2018 10:34 AM  [name] James  has presented today for surgery, with the diagnosis of poor flow  The various methods of treatment have been discussed with the patient and family. After consideration of risks, benefits and other options for treatment, the patient has consented to  Procedure(s): A/V FISTULAGRAM - Right Arm (N/A) as a surgical intervention .  The patient's history has been reviewed, patient examined, no change in status, stable for surgery.  I have reviewed the patient's chart and labs.  Questions were answered to the patient's satisfaction.     [name]

## 2018-12-24 NOTE — Progress Notes (Signed)
Report called to [facility], LPN at [facility]

## 2018-12-24 NOTE — Discharge Instructions (Signed)

## 2018-12-24 NOTE — Op Note (Signed)
PATIENT: [name] James      MRN: [id number] DOB: 11/27/1936    DATE OF PROCEDURE: 12/24/2018  INDICATIONS:    [name] James is a 82 y.o. female who has been having right upper arm swelling.  She has a right forearm graft.  She presents for fistulogram and possible venoplasty.  PROCEDURE:    1.  Ultrasound-guided access to the right forearm AV graft. 2.  Fistulogram right forearm AV graft 3.  Venoplasty of 2 stenoses within the outflow brachial vein  SURGEON: Christopher S. Dickson, MD, FACS  ANESTHESIA: Local  EBL: Minimal  TECHNIQUE: The patient was brought to the peripheral vascular lab.  The right arm was prepped and draped in usual sterile fashion.  The forearm graft on the right was identified.  The venous limb of the graft was along the ulnar aspect of the forearm.  This was cannulated under under ultrasound guidance after the skin was anesthetized.  A micropuncture sheath was introduced and then a fistulogram was obtained to evaluate the outflow veins and central veins.  There was a 70% to 80% weblike stenosis just below the axilla with a moderate stenosis distal to that.  I elected to address this with venoplasty.  Initially I selected a 5 mm x 6 cm balloon.  This was inflated to 18 atm for 1 minute.  Completion film showed significant residual stenosis.  I therefore went back with a 7 mm x 6 cm balloon which was inflated to 12 atm for 1 minute.  With this balloon inflated a retrograde shot was obtained with reflux to demonstrate the arterial anastomosis.  This was widely patent.  There was a moderate 60% stenosis above the antecubital level which was successfully ballooned with a 7 mm x 6 cm balloon which was inflated to 12 atm for 1 minute.  The wire and balloon were then removed.  A 4-0 Monocryl suture was placed and there was good hemostasis.  Patient tolerated the procedure well.  There was an excellent thrill in the graft at the completion of the procedure.   FINDINGS:   1.   No central venous stenosis. 2.  A 70 to 80% weblike stenosis in the high brachial vein with a moderate stenosis distal to that which was successfully ballooned with a 7 mm x 6 cm balloon. 3.  A 60% moderate stenosis above the antecubital level which was successfully ballooned with a 7 mm x 6 cm balloon. 4.  The graft itself is widely patent.  There is no narrowing at the arterial anastomosis or at the venous anastomosis.  Christopher Dickson, MD, FACS [facility]  DATE OF DICTATION:   12/24/2018    

## 2018-12-27 DIAGNOSIS — D631 Anemia in chronic kidney disease: Secondary | ICD-10-CM | POA: Diagnosis not present

## 2018-12-27 DIAGNOSIS — N2581 Secondary hyperparathyroidism of renal origin: Secondary | ICD-10-CM | POA: Diagnosis not present

## 2018-12-27 DIAGNOSIS — D509 Iron deficiency anemia, unspecified: Secondary | ICD-10-CM | POA: Diagnosis not present

## 2018-12-27 DIAGNOSIS — E1122 Type 2 diabetes mellitus with diabetic chronic kidney disease: Secondary | ICD-10-CM | POA: Diagnosis not present

## 2018-12-27 DIAGNOSIS — N186 End stage renal disease: Secondary | ICD-10-CM | POA: Diagnosis not present

## 2018-12-27 DIAGNOSIS — E211 Secondary hyperparathyroidism, not elsewhere classified: Secondary | ICD-10-CM | POA: Diagnosis not present

## 2018-12-28 ENCOUNTER — Inpatient Hospital Stay: Admission: RE | Admit: 2018-12-28 | Payer: Medicare Other | Source: Ambulatory Visit

## 2018-12-28 ENCOUNTER — Ambulatory Visit (HOSPITAL_COMMUNITY)
Admission: RE | Admit: 2018-12-28 | Discharge: 2018-12-28 | Disposition: A | Discharge status: Home / Self Care | Payer: Medicare Other | Source: Ambulatory Visit | Attending: Pulmonary Disease | Admitting: Pulmonary Disease

## 2018-12-28 ENCOUNTER — Encounter (HOSPITAL_COMMUNITY): Payer: Self-pay

## 2018-12-28 DIAGNOSIS — J9611 Chronic respiratory failure with hypoxia: Secondary | ICD-10-CM | POA: Diagnosis not present

## 2018-12-28 DIAGNOSIS — E1129 Type 2 diabetes mellitus with other diabetic kidney complication: Secondary | ICD-10-CM | POA: Diagnosis not present

## 2018-12-28 DIAGNOSIS — I959 Hypotension, unspecified: Secondary | ICD-10-CM | POA: Diagnosis not present

## 2018-12-28 DIAGNOSIS — I480 Paroxysmal atrial fibrillation: Secondary | ICD-10-CM | POA: Diagnosis not present

## 2018-12-28 DIAGNOSIS — R41841 Cognitive communication deficit: Secondary | ICD-10-CM | POA: Diagnosis not present

## 2018-12-28 DIAGNOSIS — R498 Other voice and resonance disorders: Secondary | ICD-10-CM | POA: Diagnosis not present

## 2018-12-28 DIAGNOSIS — M6281 Muscle weakness (generalized): Secondary | ICD-10-CM | POA: Diagnosis not present

## 2018-12-28 DIAGNOSIS — J90 Pleural effusion, not elsewhere classified: Secondary | ICD-10-CM | POA: Diagnosis not present

## 2018-12-28 DIAGNOSIS — R001 Bradycardia, unspecified: Secondary | ICD-10-CM | POA: Diagnosis not present

## 2018-12-28 DIAGNOSIS — R918 Other nonspecific abnormal finding of lung field: Secondary | ICD-10-CM | POA: Insufficient documentation

## 2018-12-28 DIAGNOSIS — R293 Abnormal posture: Secondary | ICD-10-CM | POA: Diagnosis not present

## 2018-12-29 DIAGNOSIS — D509 Iron deficiency anemia, unspecified: Secondary | ICD-10-CM | POA: Diagnosis not present

## 2018-12-29 DIAGNOSIS — D631 Anemia in chronic kidney disease: Secondary | ICD-10-CM | POA: Diagnosis not present

## 2018-12-29 DIAGNOSIS — E1122 Type 2 diabetes mellitus with diabetic chronic kidney disease: Secondary | ICD-10-CM | POA: Diagnosis not present

## 2018-12-29 DIAGNOSIS — N186 End stage renal disease: Secondary | ICD-10-CM | POA: Diagnosis not present

## 2018-12-29 DIAGNOSIS — N2581 Secondary hyperparathyroidism of renal origin: Secondary | ICD-10-CM | POA: Diagnosis not present

## 2018-12-29 DIAGNOSIS — E211 Secondary hyperparathyroidism, not elsewhere classified: Secondary | ICD-10-CM | POA: Diagnosis not present

## 2018-12-30 ENCOUNTER — Ambulatory Visit (INDEPENDENT_AMBULATORY_CARE_PROVIDER_SITE_OTHER): Payer: Medicare Other | Admitting: Cardiovascular Disease

## 2018-12-30 ENCOUNTER — Encounter: Payer: Self-pay | Admitting: Cardiovascular Disease

## 2018-12-30 VITALS — BP 110/64 | HR 65 | Ht 65.0 in

## 2018-12-30 DIAGNOSIS — E8809 Other disorders of plasma-protein metabolism, not elsewhere classified: Secondary | ICD-10-CM

## 2018-12-30 DIAGNOSIS — I480 Paroxysmal atrial fibrillation: Secondary | ICD-10-CM | POA: Diagnosis not present

## 2018-12-30 DIAGNOSIS — N186 End stage renal disease: Secondary | ICD-10-CM

## 2018-12-30 DIAGNOSIS — E46 Unspecified protein-calorie malnutrition: Secondary | ICD-10-CM | POA: Diagnosis not present

## 2018-12-30 DIAGNOSIS — I953 Hypotension of hemodialysis: Secondary | ICD-10-CM | POA: Diagnosis not present

## 2018-12-30 DIAGNOSIS — I7389 Other specified peripheral vascular diseases: Secondary | ICD-10-CM | POA: Diagnosis not present

## 2018-12-30 DIAGNOSIS — I700 Atherosclerosis of aorta: Secondary | ICD-10-CM

## 2018-12-30 DIAGNOSIS — I495 Sick sinus syndrome: Secondary | ICD-10-CM | POA: Diagnosis not present

## 2018-12-30 DIAGNOSIS — I951 Orthostatic hypotension: Secondary | ICD-10-CM | POA: Diagnosis not present

## 2018-12-30 DIAGNOSIS — D538 Other specified nutritional anemias: Secondary | ICD-10-CM | POA: Diagnosis not present

## 2018-12-30 DIAGNOSIS — F3489 Other specified persistent mood disorders: Secondary | ICD-10-CM | POA: Diagnosis not present

## 2018-12-30 NOTE — Progress Notes (Signed)
Cardiology Office Note:    Date:  12/30/2018   ID:  [name] James, DOB 03/16/1936, MRN [id number]  PCP:  Place, [place]  Cardiologist:  Sanda Klein, MD   Referring MD: Place, [place]   chief complaint: Follow-up paroxysmal atrial fibrillation and bradycardia  History of Present Illness:    [name] James is a 83 y.o. female with a hx of Paroxysmal atrial fibrillation in the setting of acute illness , diabetes mellitus, end-stage renal disease, known aortic and coronary atherosclerosis, but with a normal nuclear stress test and normal left ventricular ejection fraction.   She required venoplasty of 2 stenoses within the outflow of her right forearm AV graft, performed just a few days ago by Dr. [name].  She has not been having major problems with dialysis.  She has not had issues with shortness of breath or chest pain.  Occasionally she develops low blood pressure with dialysis so she takes Midodrine which for the most part has taken care of the issue.  She had a brief syncopal event on Monday after she stood up right after hemodialysis that resolved spontaneously.  It has now been about 5 months since her amiodarone was stopped and over the last 3 months she has not had any problems with symptomatic bradycardia.  Timolol eyedrops were also discontinued.  Heart rate today is in the 60s.  She denies dizziness or lightheadedness (except occasionally at the end of dialysis).  She is in a wheelchair and is extremely sedentary.  She is a resident at [facility].  07/06/2018 Echo described mild LVH, LVEF 65-70%, severe left atrial dilatation.  In July she had chest pain and junctional rhythm.  She was evaluated by EP and felt to be poor candidate for a pacemaker due to an occluded left subclavian artery hemodialysis via right arm fistula. Amiodarone and metoprolol were discontinued.  But in September, she was hospitalized with severe symptomatic junctional bradycardia and hypotension,  responsive to atropine and IV fluids.  She was again evaluated by EP (a different provider) and it was felt that a pacemaker should not be implanted, for the same reasons.   She has not had either atrial fibrillation or bradycardia since discharge from the hospital in September.  She requires immediate treatment for symptomatic hypotension. She has a Rx for hydralazine if BP is too high, but rarely needs it.  She is not on anticoagulation since she had problems with GI bleeding. She has been on chronic hemodialysis (MWF, Dr. [name], now Dr. [name]) since 2015. She is a permanent resident at [facility].  Note from 08/24/2018, Dr. [name]: "I have discussed the case with Dr. [name], [name], and [name]. She has symptomatic sinus node dysfunction and a clear cut indication for a PPM. Unfortunately her access is such the left left subclavian was previously used for HD but is now occluded based on the collaterals on her skin, a right arm that is being used for HD which is functioning poorly and will undergo revision with interventional radiology in the next few days. Placement of a PPM in her right femoral vein would be a consideration but examination of the area is particularly discouraging due to her massive obesity and multiple folds and evidence of chronic fungal infection. I strongly suspect that any insertion of a PPM from the right femoral vein would lead to an cardiac device infection with in a few months. She is quite sedentary and does not walk or transfer and lays in the bed much  of the day. Her exam is accurately documented above. Other than an Epicardial PPM, we do not have an option for improving her HR other than possibly adding theophylline or encouraging her to drink more caffeine. She is approaching hospice level care".  And 08/26/2018 "She has no endovascular access sites. We cannot place an endovascular pacemaker except for a Micra device which is placed via right femoral vein into the  RV apex. The placement of a single chamber PM in the RV in a patient with sinus rhythm will result in pacemaker syndrome, will make her feel worse and also make her device dependent. Placement of a standard PM via the right femoral vein will result in a device infection. The area is dirty.   Her only option for physiologic pacing would be to call CVTS and place and epicardial pacing system with an open chest procedure either with median sternotomy or thoracotomy. I feel strongly that she is not a candidate for this procedure as well.   I would consider stopping the timolol eye drops.   In summary there are no good options for pacing this patient. As I noted, medical options that might increase her HR are theophylline. The therapeutic window for these drugs is very small however."    Past Medical History:  Diagnosis Date  . Anemia, chronic disease   . Anxiety   . Arthritis    knees  . Atrial fibrillation ([place])   . Barrett esophagus   . Benign paroxysmal positional vertigo   . Breast cancer ([place]) 1980s   bil mastectomies, no radiation or chemo  . Cataract   . Chest pain, atypical 12/05/2008   R/Lmv- normal perfusion all regions, noe ECG changes   . CHF (congestive heart failure) ([place]) 05/13/2011   echo - KC>00%; stage 1 diastolic dysfunction; elevated LV filling pressure, MAC  . CKD (chronic kidney disease), stage III ([place])   . Claudication ([place]) 10/30/2005   doppler - normal evaluation, no evidence of aneurysm, diameter reduction, dissection, compression or vascular abnormality)  . Complication of anesthesia   . Dyslipidemia   . Dysrhythmia    Atrial Fibrillation  . Edema   . ESRD (end stage renal disease) on dialysis ([place])    "[facility]; [address]; MWF" (07/06/2018)  . Family history of adverse reaction to anesthesia    son also has nausea   . Fever blister 12/2016  . Gastritis and gastroduodenitis   . GERD (gastroesophageal reflux disease)   . Gout   .  Hemodialysis-associated hypotension   . HLD (hyperlipidemia)   . Hypertension   . IBS (irritable bowel syndrome)   . Insomnia   . Morbid obesity ([place])   . On home oxygen therapy    "2L; 24/7" (07/06/2018)  . PAF (paroxysmal atrial fibrillation) ([place])   . Peripheral neuropathy   . Pneumonia 12/2016  . PONV (postoperative nausea and vomiting)   . Renal cyst   . Renal insufficiency 10/30/2005   doppler - abn resistance consistent w/ parenchymal disease  . Renovascular hypertension   . Type 2 diabetes mellitus with diabetic neuropathy, with long-term current use of insulin ([place])   . Vertigo     Past Surgical History:  Procedure Laterality Date  . A/V FISTULAGRAM Left 03/19/2018   Procedure: A/V FISTULAGRAM;  Surgeon: Angelia Mould, MD;  Location: [place] [facility];  Service: Cardiovascular;  Laterality: Left;  . A/V FISTULAGRAM N/A 12/24/2018   Procedure: A/V FISTULAGRAM - Right Arm;  Surgeon: Angelia Mould,  MD;  Location: [place] [facility];  Service: Cardiovascular;  Laterality: N/A;  . A/V SHUNTOGRAM Left 01/19/2018   Procedure: A/V SHUNTOGRAM;  Surgeon: Serafina Mitchell, MD;  Location: [place] [facility];  Service: Cardiovascular;  Laterality: Left;  . ANGIOPLASTY Left 02/12/2015   Procedure: ANGIOPLASTY;  Surgeon: Angelia Mould, MD;  Location: [facility] CATH LAB;  Service: Cardiovascular;  Laterality: Left;  AVF  . AV FISTULA PLACEMENT Left 11/30/2014   Procedure: ARTERIOVENOUS (AV) FISTULA CREATION LEFT ARM;  Surgeon: Angelia Mould, MD;  Location: [place];  Service: Vascular;  Laterality: Left;  . AV FISTULA PLACEMENT Left 03/20/2015   Procedure:  Inserton of Left Upper Arm Gortex Graft;  Surgeon: Angelia Mould, MD;  Location: [place];  Service: Vascular;  Laterality: Left;  . AV FISTULA PLACEMENT Left 04/12/2015   Procedure: INSERTION OF LEFT ARM  ARTERIOVENOUS GORE-TEX GRAFT ;  Surgeon: Angelia Mould, MD;  Location: [place];  Service: Vascular;   Laterality: Left;  . AV FISTULA PLACEMENT Right 06/08/2018   Procedure: INSERTION OF ARTERIOVENOUS (AV) GORE-TEX GRAFT ARM USING 4-7MM X 45CM GORETEX GRAFT;  Surgeon: Angelia Mould, MD;  Location: [facility];  Service: Vascular;  Laterality: Right;  . BACK SURGERY  ~1980  . CATARACT EXTRACTION Right   . CHOLECYSTECTOMY    . ESOPHAGOGASTRODUODENOSCOPY N/A 10/24/2014   Procedure: ESOPHAGOGASTRODUODENOSCOPY (EGD);  Surgeon: Jerene Bears, MD;  Location: [facility] ENDOSCOPY;  Service: Endoscopy;  Laterality: N/A;  . EXCHANGE OF A DIALYSIS CATHETER Left 11/30/2014   Procedure: EXCHANGE OF A DIALYSIS CATHETER, LEFT INTERNAL JUGULAR;  Surgeon: Angelia Mould, MD;  Location: [facility];  Service: Vascular;  Laterality: Left;  . EYE SURGERY Left    transplant  . FISTULOGRAM N/A 02/12/2015   Procedure: FISTULOGRAM;  Surgeon: Angelia Mould, MD;  Location: [facility] CATH LAB;  Service: Cardiovascular;  Laterality: N/A;  . HEMORRHOID SURGERY    . IR AV DIALY SHUNT INTRO NEEDLE/INTRACATH INITIAL W/PTA/IMG RIGHT Right 08/25/2018  . IR AV DIALY SHUNT INTRO NEEDLE/INTRACATH INITIAL W/PTA/IMG RIGHT Right 11/17/2018  . IR US GUIDE VASC ACCESS RIGHT  08/25/2018  . LIGATION ARTERIOVENOUS GORTEX GRAFT Left 08/27/2018   Procedure: LIGATION ARTERIOVENOUS GORTEX GRAFT ARM;  Surgeon: Angelia Mould, MD;  Location: [facility];  Service: Vascular;  Laterality: Left;  . LIGATION OF ARTERIOVENOUS  FISTULA Left 03/20/2015   Procedure: LIGATION OF ARTERIOVENOUS  FISTULA;  Surgeon: Angelia Mould, MD;  Location: [facility];  Service: Vascular;  Laterality: Left;  Marland Kitchen MASTECTOMY Bilateral ~1982   bil breast reconstruction with implants  . ORIF TIBIA & FIBULA FRACTURES Left 2007   also had left non displaced malleolar fracture.   Marland Kitchen PERIPHERAL VASCULAR BALLOON ANGIOPLASTY Left 01/19/2018   Procedure: PERIPHERAL VASCULAR BALLOON ANGIOPLASTY;  Surgeon: Serafina Mitchell, MD;  Location: [facility];  Service: Cardiovascular;   Laterality: Left;  fistula  . PERIPHERAL VASCULAR BALLOON ANGIOPLASTY Left 03/19/2018   Procedure: PERIPHERAL VASCULAR BALLOON ANGIOPLASTY;  Surgeon: Angelia Mould, MD;  Location: [facility];  Service: Cardiovascular;  Laterality: Left;  UPPER ARM FISTULA  . PERIPHERAL VASCULAR BALLOON ANGIOPLASTY Right 12/24/2018   Procedure: PERIPHERAL VASCULAR BALLOON ANGIOPLASTY;  Surgeon: Angelia Mould, MD;  Location: [facility];  Service: Cardiovascular;  Laterality: Right;  arm fistula  . REMOVAL OF GRAFT Left 04/12/2015   Procedure: REMOVAL OF LEFT ARM ARTERIOVENOUS GORE-TEX GRAFT;  Surgeon: Angelia Mould, MD;  Location: [facility];  Service: Vascular;  Laterality: Left;    Current Medications: Current Meds  Medication Sig  . acetaminophen (TYLENOL) 325 MG tablet Take 650 mg by mouth every 6 (six) hours as needed for fever ("for vomiting/nausea," per [facility]).   Marland Kitchen allopurinol (ZYLOPRIM) 100 MG tablet Take 100 mg by mouth daily.  Marland Kitchen aspirin 81 MG chewable tablet Chew 81 mg by mouth daily.  . B Complex-C-Folic Acid (NEPHRO VITAMINS) 0.8 MG TABS Take 1 tablet by mouth daily.  . Brinzolamide-Brimonidine (SIMBRINZA) 1-0.2 % SUSP Place 1 drop into both eyes 2 (two) times daily.  . calcium elemental as carbonate (BARIATRIC TUMS ULTRA) 400 MG chewable tablet Chew 1,000 mg by mouth 3 (three) times daily with meals.  . diazepam (VALIUM) 5 MG tablet Take 1 tablet (5 mg total) by mouth at bedtime.  . diclofenac sodium (VOLTAREN) 1 % GEL Apply 4 g topically 4 (four) times daily. Applied to right knee  . DIMETHICONE, TOPICAL, 5 % CREA Apply 1 application topically See admin instructions. Place a nickel-sized amount onto buttock(s) area 2 times a day for barrier protection  . hydrALAZINE (APRESOLINE) 10 MG tablet Take 5 mg by mouth 3 (three) times daily as needed (hypertension >160/90).   . Lidocaine (ASPERCREME LIDOCAINE) 4 % PTCH Place 1 patch onto the skin daily. Applied to right knee  (on at 0800 & off at 2000)  . loperamide (IMODIUM A-D) 2 MG tablet Take 2 mg by mouth every 4 (four) hours as needed for diarrhea or loose stools (for diarrhea).   . loratadine (CLARITIN) 10 MG tablet Take 10 mg by mouth daily.  . meclizine (ANTIVERT) 25 MG tablet Take 25 mg by mouth 3 (three) times daily as needed for dizziness (vertigo).   . Melatonin 3 MG TABS Take 6 mg by mouth at bedtime.   . midodrine (PROAMATINE) 10 MG tablet Take 10 mg by mouth See admin instructions. Take 10 mg by mouth WITH MORNING MEDS AT FACILITY on Mon/Wed/Fri- dialysis days Take 10 mg by mouth at dialysis on Mon/Wed/Fri  . midodrine (PROAMATINE) 2.5 MG tablet Take 2.5 mg by mouth See admin instructions. 3 times daily on Sun Tues Thurs and Sat , and 2 times daily on Mon Wed and Fri after Dialysis  . OXYGEN Inhale 2 L into the lungs continuous.   . promethazine (PHENERGAN) 25 MG tablet Take 25 mg by mouth every 8 (eight) hours as needed for nausea or vomiting.   . traMADol (ULTRAM) 50 MG tablet Take 50 mg by mouth every 6 (six) hours as needed (pain).   . traZODone (DESYREL) 50 MG tablet Take 25 mg by mouth at bedtime.      Allergies:   Erythromycin; Codeine; Erythromycin base; Penicillin g; Penicillins; and Zantac [ranitidine hcl]   Social History   Socioeconomic History  . Marital status: Divorced    Spouse name: Not on file  . Number of children: Not on file  . Years of education: Not on file  . Highest education level: Not on file  Occupational History  . Not on file  Social Needs  . Financial resource strain: Not on file  . Food insecurity:    Worry: Not on file    Inability: Not on file  . Transportation needs:    Medical: Not on file    Non-medical: Not on file  Tobacco Use  . Smoking status: Never Smoker  . Smokeless tobacco: Never Used  Substance and Sexual Activity  . Alcohol use: No  .  Drug use: No  . Sexual activity: Not on file  Lifestyle  . Physical activity:    Days per week: Not  on file    Minutes per session: Not on file  . Stress: Not on file  Relationships  . Social connections:    Talks on phone: Not on file    Gets together: Not on file    Attends religious service: Not on file    Active member of club or organization: Not on file    Attends meetings of clubs or organizations: Not on file    Relationship status: Not on file  Other Topics Concern  . Not on file  Social History Narrative   [facility] (10/06/17):   Originally from [facility]. Has always lived in [place]. She currently lives in [place]. No bird exposure. [place] Previously traveled to [place], [place], & [place]. Previously worked in a nursery and also a Pharmacist, community.      Family History: The patient's family history includes Diabetes in her mother; Heart attack in her father; Heart disease in her brother and mother; Lung disease in her brother; Multiple myeloma in her sister; Non-Hodgkin's lymphoma in her mother; Parkinson's disease in her brother; Stroke in her father. ROS:   Please see the history of present illness.     All other systems reviewed and are negative.  EKGs/Labs/Other Studies Reviewed:    The following studies were reviewed today: AV fistulogram January 3  EKG:  EKG is ordered today.  It shows normal sinus rhythm with a rate of 65 bpm QS pattern in leads V1 and V2, no repolarization abnormalities but prolonged QTC 495 ms.  Recent Labs: 08/23/2018: ALT 27; TSH 1.417 08/28/2018: Platelets 172 12/24/2018: BUN 30; Creatinine, Ser 3.00; Hemoglobin 11.9; Potassium 3.0; Sodium 140  Recent Lipid Panel    Component Value Date/Time   CHOL 157 08/24/2018 0423   TRIG 147 08/24/2018 0423   HDL 32 (L) 08/24/2018 0423   CHOLHDL 4.9 08/24/2018 0423   VLDL 29 08/24/2018 0423   LDLCALC 96 08/24/2018 0423    Physical Exam:    VS:  BP 110/64   Pulse 65   Ht 5' 5"  (1.651 m)   BMI 25.29 kg/m     Wt Readings from Last 3 Encounters:  12/24/18 152 lb (68.9 kg)  12/20/18 152 lb  (68.9 kg)  12/09/18 152 lb (68.9 kg)      General: Alert, oriented x3, no distress, pale, looks chronically ill Head: no evidence of trauma, PERRL, EOMI, no exophtalmos or lid lag, no myxedema, no xanthelasma; normal ears, nose and oropharynx Neck: normal jugular venous pulsations and no hepatojugular reflux; brisk carotid pulses without delay and no carotid bruits Chest: clear to auscultation, no signs of consolidation by percussion or palpation, normal fremitus, symmetrical and full respiratory excursions Cardiovascular: normal position and quality of the apical impulse, regular rhythm, normal first and second heart sounds, 1/6 aortic ejection murmur, no diastolic murmurs, rubs or gallops. Occluded fistula L arm, reasonably good thrill/bruit R forearm AV graft. Collateral veins overly L shoulder. Abdomen: no tenderness or distention, no masses by palpation, no abnormal pulsatility or arterial bruits, normal bowel sounds, no hepatosplenomegaly Extremities: no clubbing, cyanosis or edema; 2+ radial, ulnar and brachial pulses bilaterally; 2+ right femoral, posterior tibial and dorsalis pedis pulses; 2+ left femoral, posterior tibial and dorsalis pedis pulses; no subclavian or femoral bruits Neurological: grossly nonfocal Psych: Normal mood and affect    ASSESSMENT:  1. Paroxysmal atrial fibrillation ([place])   2. Sick sinus syndrome ([place])   3. Hemodialysis-associated hypotension   4. ESRD (end stage renal disease) ([place])   5. Aortic atherosclerosis ([place])   6. Hypoalbuminemia due to protein-calorie malnutrition ([facility])    PLAN:    In order of problems listed above:  1. AFib: Has not been documented in several years.  She should not be on any antiarrhythmic drugs since this would increase the likelihood of symptomatic bradycardia.  Not a good candidate for anticoagulation.  She is on aspirin. 2. SSS:  With periods of symptomatic bradycardia in July and early September. Amiodarone was stopped  most 6 months ago and should now be out of her system.  Evaluated by 2 different EP specialists who have deemed her a poor candidate for device therapy. Avoid all negative chronotropic agents, even in the form of glaucoma beta-blocker eyedrops. 3. Hypotension: on midodrine as needed (always on HD days).  Doing reasonably well with this on the average, although she did have a brief syncopal event earlier this week 4. ESRD: On dialysis MWF. Occluded central L arm venous system and ligated L arm AVF. Dialysis via R arm.  Recently required venoplasty of 2 sites the brachial vein attached to her AV graft 5. Ao atherosclerosis: Has evidence of peripheral arterial disease as well as coronary calcification on chest CT, thankfully asymptomatic(except for chest pain in July, during arrhythmia). Had a normal nuclear stress test in 2015. LDL<100. Without meds. Mildly abnormal LFTs on Sept admission. 6. Hypoalbuminemia: Poor prognostic feature, but levels are improving.  I do not see a repeat albumin level since last September,, but I suspect this has been checked at dialysis.  Medication Adjustments/Labs and Tests Ordered: Current medicines are reviewed at length with the patient today.  Concerns regarding medicines are outlined above.  No orders of the defined types were placed in this encounter.  No orders of the defined types were placed in this encounter.   Signed, Sanda Klein, MD  12/30/2018 6:10 PM    [place]

## 2018-12-30 NOTE — Patient Instructions (Signed)
Medication Instructions:  Dr Sallyanne Kuster recommends that you continue on your current medications as directed. Please refer to the Current Medication list given to you today.  If you need a refill on your cardiac medications before your next appointment, please call your pharmacy.   Follow-Up: At [facility], you and your health needs are our priority.  As part of our continuing mission to provide you with exceptional heart care, we have created designated Provider Care Teams.  These Care Teams include your primary Cardiologist (physician) and Advanced Practice Providers (APPs -  Physician Assistants and Nurse Practitioners) who all work together to provide you with the care you need, when you need it. You will need a follow up appointment in 12 months.  Please call our office 2 months in advance to schedule this appointment.  You may see Sanda Klein, MD or one of the following Advanced Practice Providers on your designated Care Team: [place], [place] . Fabian Sharp, PA-C . You will receive a reminder letter in the mail two months in advance. If you don't receive a letter, please call our office to schedule the follow-up appointment.

## 2018-12-31 DIAGNOSIS — N2581 Secondary hyperparathyroidism of renal origin: Secondary | ICD-10-CM | POA: Diagnosis not present

## 2018-12-31 DIAGNOSIS — E1122 Type 2 diabetes mellitus with diabetic chronic kidney disease: Secondary | ICD-10-CM | POA: Diagnosis not present

## 2018-12-31 DIAGNOSIS — E211 Secondary hyperparathyroidism, not elsewhere classified: Secondary | ICD-10-CM | POA: Diagnosis not present

## 2018-12-31 DIAGNOSIS — I480 Paroxysmal atrial fibrillation: Secondary | ICD-10-CM | POA: Diagnosis not present

## 2018-12-31 DIAGNOSIS — E1129 Type 2 diabetes mellitus with other diabetic kidney complication: Secondary | ICD-10-CM | POA: Diagnosis not present

## 2018-12-31 DIAGNOSIS — D631 Anemia in chronic kidney disease: Secondary | ICD-10-CM | POA: Diagnosis not present

## 2018-12-31 DIAGNOSIS — N186 End stage renal disease: Secondary | ICD-10-CM | POA: Diagnosis not present

## 2018-12-31 DIAGNOSIS — R001 Bradycardia, unspecified: Secondary | ICD-10-CM | POA: Diagnosis not present

## 2018-12-31 DIAGNOSIS — M6281 Muscle weakness (generalized): Secondary | ICD-10-CM | POA: Diagnosis not present

## 2018-12-31 DIAGNOSIS — I959 Hypotension, unspecified: Secondary | ICD-10-CM | POA: Diagnosis not present

## 2018-12-31 DIAGNOSIS — D509 Iron deficiency anemia, unspecified: Secondary | ICD-10-CM | POA: Diagnosis not present

## 2018-12-31 DIAGNOSIS — R498 Other voice and resonance disorders: Secondary | ICD-10-CM | POA: Diagnosis not present

## 2019-01-03 DIAGNOSIS — N186 End stage renal disease: Secondary | ICD-10-CM | POA: Diagnosis not present

## 2019-01-03 DIAGNOSIS — D509 Iron deficiency anemia, unspecified: Secondary | ICD-10-CM | POA: Diagnosis not present

## 2019-01-03 DIAGNOSIS — N2581 Secondary hyperparathyroidism of renal origin: Secondary | ICD-10-CM | POA: Diagnosis not present

## 2019-01-03 DIAGNOSIS — E1122 Type 2 diabetes mellitus with diabetic chronic kidney disease: Secondary | ICD-10-CM | POA: Diagnosis not present

## 2019-01-03 DIAGNOSIS — D631 Anemia in chronic kidney disease: Secondary | ICD-10-CM | POA: Diagnosis not present

## 2019-01-03 DIAGNOSIS — E211 Secondary hyperparathyroidism, not elsewhere classified: Secondary | ICD-10-CM | POA: Diagnosis not present

## 2019-01-05 DIAGNOSIS — E1122 Type 2 diabetes mellitus with diabetic chronic kidney disease: Secondary | ICD-10-CM | POA: Diagnosis not present

## 2019-01-05 DIAGNOSIS — D631 Anemia in chronic kidney disease: Secondary | ICD-10-CM | POA: Diagnosis not present

## 2019-01-05 DIAGNOSIS — N2581 Secondary hyperparathyroidism of renal origin: Secondary | ICD-10-CM | POA: Diagnosis not present

## 2019-01-05 DIAGNOSIS — N186 End stage renal disease: Secondary | ICD-10-CM | POA: Diagnosis not present

## 2019-01-05 DIAGNOSIS — E211 Secondary hyperparathyroidism, not elsewhere classified: Secondary | ICD-10-CM | POA: Diagnosis not present

## 2019-01-05 DIAGNOSIS — D509 Iron deficiency anemia, unspecified: Secondary | ICD-10-CM | POA: Diagnosis not present

## 2019-01-07 DIAGNOSIS — E1122 Type 2 diabetes mellitus with diabetic chronic kidney disease: Secondary | ICD-10-CM | POA: Diagnosis not present

## 2019-01-07 DIAGNOSIS — N2581 Secondary hyperparathyroidism of renal origin: Secondary | ICD-10-CM | POA: Diagnosis not present

## 2019-01-07 DIAGNOSIS — D631 Anemia in chronic kidney disease: Secondary | ICD-10-CM | POA: Diagnosis not present

## 2019-01-07 DIAGNOSIS — N186 End stage renal disease: Secondary | ICD-10-CM | POA: Diagnosis not present

## 2019-01-07 DIAGNOSIS — D509 Iron deficiency anemia, unspecified: Secondary | ICD-10-CM | POA: Diagnosis not present

## 2019-01-07 DIAGNOSIS — E211 Secondary hyperparathyroidism, not elsewhere classified: Secondary | ICD-10-CM | POA: Diagnosis not present

## 2019-01-10 DIAGNOSIS — D509 Iron deficiency anemia, unspecified: Secondary | ICD-10-CM | POA: Diagnosis not present

## 2019-01-10 DIAGNOSIS — E1122 Type 2 diabetes mellitus with diabetic chronic kidney disease: Secondary | ICD-10-CM | POA: Diagnosis not present

## 2019-01-10 DIAGNOSIS — N2581 Secondary hyperparathyroidism of renal origin: Secondary | ICD-10-CM | POA: Diagnosis not present

## 2019-01-10 DIAGNOSIS — D631 Anemia in chronic kidney disease: Secondary | ICD-10-CM | POA: Diagnosis not present

## 2019-01-10 DIAGNOSIS — E211 Secondary hyperparathyroidism, not elsewhere classified: Secondary | ICD-10-CM | POA: Diagnosis not present

## 2019-01-10 DIAGNOSIS — N186 End stage renal disease: Secondary | ICD-10-CM | POA: Diagnosis not present

## 2019-01-12 DIAGNOSIS — N2581 Secondary hyperparathyroidism of renal origin: Secondary | ICD-10-CM | POA: Diagnosis not present

## 2019-01-12 DIAGNOSIS — D509 Iron deficiency anemia, unspecified: Secondary | ICD-10-CM | POA: Diagnosis not present

## 2019-01-12 DIAGNOSIS — E211 Secondary hyperparathyroidism, not elsewhere classified: Secondary | ICD-10-CM | POA: Diagnosis not present

## 2019-01-12 DIAGNOSIS — N186 End stage renal disease: Secondary | ICD-10-CM | POA: Diagnosis not present

## 2019-01-12 DIAGNOSIS — D631 Anemia in chronic kidney disease: Secondary | ICD-10-CM | POA: Diagnosis not present

## 2019-01-12 DIAGNOSIS — E1122 Type 2 diabetes mellitus with diabetic chronic kidney disease: Secondary | ICD-10-CM | POA: Diagnosis not present

## 2019-01-14 DIAGNOSIS — E1122 Type 2 diabetes mellitus with diabetic chronic kidney disease: Secondary | ICD-10-CM | POA: Diagnosis not present

## 2019-01-14 DIAGNOSIS — N186 End stage renal disease: Secondary | ICD-10-CM | POA: Diagnosis not present

## 2019-01-14 DIAGNOSIS — D509 Iron deficiency anemia, unspecified: Secondary | ICD-10-CM | POA: Diagnosis not present

## 2019-01-14 DIAGNOSIS — D631 Anemia in chronic kidney disease: Secondary | ICD-10-CM | POA: Diagnosis not present

## 2019-01-14 DIAGNOSIS — E211 Secondary hyperparathyroidism, not elsewhere classified: Secondary | ICD-10-CM | POA: Diagnosis not present

## 2019-01-14 DIAGNOSIS — N2581 Secondary hyperparathyroidism of renal origin: Secondary | ICD-10-CM | POA: Diagnosis not present

## 2019-01-17 DIAGNOSIS — E211 Secondary hyperparathyroidism, not elsewhere classified: Secondary | ICD-10-CM | POA: Diagnosis not present

## 2019-01-17 DIAGNOSIS — D509 Iron deficiency anemia, unspecified: Secondary | ICD-10-CM | POA: Diagnosis not present

## 2019-01-17 DIAGNOSIS — N186 End stage renal disease: Secondary | ICD-10-CM | POA: Diagnosis not present

## 2019-01-17 DIAGNOSIS — D631 Anemia in chronic kidney disease: Secondary | ICD-10-CM | POA: Diagnosis not present

## 2019-01-17 DIAGNOSIS — E1122 Type 2 diabetes mellitus with diabetic chronic kidney disease: Secondary | ICD-10-CM | POA: Diagnosis not present

## 2019-01-17 DIAGNOSIS — N2581 Secondary hyperparathyroidism of renal origin: Secondary | ICD-10-CM | POA: Diagnosis not present

## 2019-01-19 DIAGNOSIS — E211 Secondary hyperparathyroidism, not elsewhere classified: Secondary | ICD-10-CM | POA: Diagnosis not present

## 2019-01-19 DIAGNOSIS — D631 Anemia in chronic kidney disease: Secondary | ICD-10-CM | POA: Diagnosis not present

## 2019-01-19 DIAGNOSIS — N186 End stage renal disease: Secondary | ICD-10-CM | POA: Diagnosis not present

## 2019-01-19 DIAGNOSIS — N2581 Secondary hyperparathyroidism of renal origin: Secondary | ICD-10-CM | POA: Diagnosis not present

## 2019-01-19 DIAGNOSIS — E1122 Type 2 diabetes mellitus with diabetic chronic kidney disease: Secondary | ICD-10-CM | POA: Diagnosis not present

## 2019-01-19 DIAGNOSIS — D509 Iron deficiency anemia, unspecified: Secondary | ICD-10-CM | POA: Diagnosis not present

## 2019-01-20 DIAGNOSIS — E119 Type 2 diabetes mellitus without complications: Secondary | ICD-10-CM | POA: Diagnosis not present

## 2019-01-20 DIAGNOSIS — I951 Orthostatic hypotension: Secondary | ICD-10-CM | POA: Diagnosis not present

## 2019-01-20 DIAGNOSIS — R110 Nausea: Secondary | ICD-10-CM | POA: Diagnosis not present

## 2019-01-20 DIAGNOSIS — I480 Paroxysmal atrial fibrillation: Secondary | ICD-10-CM | POA: Diagnosis not present

## 2019-01-21 DIAGNOSIS — D509 Iron deficiency anemia, unspecified: Secondary | ICD-10-CM | POA: Diagnosis not present

## 2019-01-21 DIAGNOSIS — E211 Secondary hyperparathyroidism, not elsewhere classified: Secondary | ICD-10-CM | POA: Diagnosis not present

## 2019-01-21 DIAGNOSIS — N186 End stage renal disease: Secondary | ICD-10-CM | POA: Diagnosis not present

## 2019-01-21 DIAGNOSIS — N2581 Secondary hyperparathyroidism of renal origin: Secondary | ICD-10-CM | POA: Diagnosis not present

## 2019-01-21 DIAGNOSIS — D631 Anemia in chronic kidney disease: Secondary | ICD-10-CM | POA: Diagnosis not present

## 2019-01-21 DIAGNOSIS — E1122 Type 2 diabetes mellitus with diabetic chronic kidney disease: Secondary | ICD-10-CM | POA: Diagnosis not present

## 2019-01-22 DIAGNOSIS — N186 End stage renal disease: Secondary | ICD-10-CM | POA: Diagnosis not present

## 2019-01-22 DIAGNOSIS — Z992 Dependence on renal dialysis: Secondary | ICD-10-CM | POA: Diagnosis not present

## 2019-01-22 DIAGNOSIS — E1122 Type 2 diabetes mellitus with diabetic chronic kidney disease: Secondary | ICD-10-CM | POA: Diagnosis not present

## 2019-01-24 DIAGNOSIS — D509 Iron deficiency anemia, unspecified: Secondary | ICD-10-CM | POA: Diagnosis not present

## 2019-01-24 DIAGNOSIS — E1122 Type 2 diabetes mellitus with diabetic chronic kidney disease: Secondary | ICD-10-CM | POA: Diagnosis not present

## 2019-01-24 DIAGNOSIS — D631 Anemia in chronic kidney disease: Secondary | ICD-10-CM | POA: Diagnosis not present

## 2019-01-24 DIAGNOSIS — N186 End stage renal disease: Secondary | ICD-10-CM | POA: Diagnosis not present

## 2019-01-24 DIAGNOSIS — E211 Secondary hyperparathyroidism, not elsewhere classified: Secondary | ICD-10-CM | POA: Diagnosis not present

## 2019-01-24 DIAGNOSIS — N2581 Secondary hyperparathyroidism of renal origin: Secondary | ICD-10-CM | POA: Diagnosis not present

## 2019-01-25 ENCOUNTER — Telehealth: Payer: Self-pay | Admitting: Cardiovascular Disease

## 2019-01-25 NOTE — Telephone Encounter (Signed)
New message   Patient c/o Palpitations:  High priority if patient c/o lightheadedness, shortness of breath, or chest pain  1) How long have you had palpitations/irregular HR/ Afib? Are you having the symptoms now? Last Friday at dialysis and a week before 3 times   2) Are you currently experiencing lightheadedness, SOB or CP? Chest pain   3) Do you have a history of afib (atrial fibrillation) or irregular heart rhythm? Yes   4) Have you checked your BP or HR? (document readings if available):   5) Are you experiencing any other symptoms? Sometimes stomach pain

## 2019-01-25 NOTE — Telephone Encounter (Signed)
Number rings busy at 4 pm

## 2019-01-26 DIAGNOSIS — N2581 Secondary hyperparathyroidism of renal origin: Secondary | ICD-10-CM | POA: Diagnosis not present

## 2019-01-26 DIAGNOSIS — N186 End stage renal disease: Secondary | ICD-10-CM | POA: Diagnosis not present

## 2019-01-26 DIAGNOSIS — E1122 Type 2 diabetes mellitus with diabetic chronic kidney disease: Secondary | ICD-10-CM | POA: Diagnosis not present

## 2019-01-26 DIAGNOSIS — D631 Anemia in chronic kidney disease: Secondary | ICD-10-CM | POA: Diagnosis not present

## 2019-01-26 DIAGNOSIS — E211 Secondary hyperparathyroidism, not elsewhere classified: Secondary | ICD-10-CM | POA: Diagnosis not present

## 2019-01-26 DIAGNOSIS — D509 Iron deficiency anemia, unspecified: Secondary | ICD-10-CM | POA: Diagnosis not present

## 2019-01-26 NOTE — Telephone Encounter (Signed)
Called notified patient of [place] note.  Patient verbalized understanding.

## 2019-01-26 NOTE — Telephone Encounter (Signed)
I understand. Quite likely she is describing atrial fibrillation episodes. Unfortunately, there is no treatment I can prescribe for this that would not increase her likelihood of passing out from slow heart beats. We had to stop metoprolol and amiodarone because of that last year. When the palpitations occur, I would just try to take it easy, lie down until it passes. MCr

## 2019-01-26 NOTE — Telephone Encounter (Signed)
Called patient, she wanted you to be aware that she has had 3 episodes since her last OV with you. She can feel very strong heartbeats, and can feel it beating fast. She states she does have some SOB with these episodes, but she is on O2, no current Chest pain, and she states it is not really painful during the episodes, but she states it does not feel good. She denies swelling in her extremities. She does mention having some lightheadedness with the episodes. Patient is unsure of her most recent BP or HR.  She wanted Dr.C to be made aware and if he has any recommendations.  Thank you!

## 2019-01-28 DIAGNOSIS — D509 Iron deficiency anemia, unspecified: Secondary | ICD-10-CM | POA: Diagnosis not present

## 2019-01-28 DIAGNOSIS — N186 End stage renal disease: Secondary | ICD-10-CM | POA: Diagnosis not present

## 2019-01-28 DIAGNOSIS — D631 Anemia in chronic kidney disease: Secondary | ICD-10-CM | POA: Diagnosis not present

## 2019-01-28 DIAGNOSIS — E211 Secondary hyperparathyroidism, not elsewhere classified: Secondary | ICD-10-CM | POA: Diagnosis not present

## 2019-01-28 DIAGNOSIS — N2581 Secondary hyperparathyroidism of renal origin: Secondary | ICD-10-CM | POA: Diagnosis not present

## 2019-01-28 DIAGNOSIS — E1122 Type 2 diabetes mellitus with diabetic chronic kidney disease: Secondary | ICD-10-CM | POA: Diagnosis not present

## 2019-01-31 DIAGNOSIS — N2581 Secondary hyperparathyroidism of renal origin: Secondary | ICD-10-CM | POA: Diagnosis not present

## 2019-01-31 DIAGNOSIS — E1122 Type 2 diabetes mellitus with diabetic chronic kidney disease: Secondary | ICD-10-CM | POA: Diagnosis not present

## 2019-01-31 DIAGNOSIS — N186 End stage renal disease: Secondary | ICD-10-CM | POA: Diagnosis not present

## 2019-01-31 DIAGNOSIS — R195 Other fecal abnormalities: Secondary | ICD-10-CM | POA: Diagnosis not present

## 2019-01-31 DIAGNOSIS — D631 Anemia in chronic kidney disease: Secondary | ICD-10-CM | POA: Diagnosis not present

## 2019-01-31 DIAGNOSIS — D509 Iron deficiency anemia, unspecified: Secondary | ICD-10-CM | POA: Diagnosis not present

## 2019-01-31 DIAGNOSIS — R110 Nausea: Secondary | ICD-10-CM | POA: Diagnosis not present

## 2019-01-31 DIAGNOSIS — E211 Secondary hyperparathyroidism, not elsewhere classified: Secondary | ICD-10-CM | POA: Diagnosis not present

## 2019-02-02 ENCOUNTER — Telehealth: Payer: Self-pay | Admitting: Internal Medicine

## 2019-02-02 DIAGNOSIS — R197 Diarrhea, unspecified: Secondary | ICD-10-CM

## 2019-02-02 DIAGNOSIS — D631 Anemia in chronic kidney disease: Secondary | ICD-10-CM | POA: Diagnosis not present

## 2019-02-02 DIAGNOSIS — N2581 Secondary hyperparathyroidism of renal origin: Secondary | ICD-10-CM | POA: Diagnosis not present

## 2019-02-02 DIAGNOSIS — E1122 Type 2 diabetes mellitus with diabetic chronic kidney disease: Secondary | ICD-10-CM | POA: Diagnosis not present

## 2019-02-02 DIAGNOSIS — N390 Urinary tract infection, site not specified: Secondary | ICD-10-CM | POA: Diagnosis not present

## 2019-02-02 DIAGNOSIS — D509 Iron deficiency anemia, unspecified: Secondary | ICD-10-CM | POA: Diagnosis not present

## 2019-02-02 DIAGNOSIS — E211 Secondary hyperparathyroidism, not elsewhere classified: Secondary | ICD-10-CM | POA: Diagnosis not present

## 2019-02-02 DIAGNOSIS — N186 End stage renal disease: Secondary | ICD-10-CM | POA: Diagnosis not present

## 2019-02-02 NOTE — Telephone Encounter (Signed)
Yes please check GI pathogen panel plus Cdiff (if not included).  Once we ensure no infection we can discuss anti diarrheal

## 2019-02-02 NOTE — Telephone Encounter (Signed)
Patient's daughter notified to come pick up the labs and specimen cups and that she will need to return them here.

## 2019-02-02 NOTE — Telephone Encounter (Signed)
Daughter reports that mom is having diarrhea.  "everytime she eats or drinks".  Daughter reports that she is not getting the full dose of imodium.  She wants to know if you want to order an alternative?  Do you want to order stool studies?

## 2019-02-02 NOTE — Telephone Encounter (Signed)
Mother is throwing up, nausea, diarrhea, stomach pains, frequent bowel movements... wants to speak with nurse.

## 2019-02-03 DIAGNOSIS — R198 Other specified symptoms and signs involving the digestive system and abdomen: Secondary | ICD-10-CM | POA: Diagnosis not present

## 2019-02-03 DIAGNOSIS — Z992 Dependence on renal dialysis: Secondary | ICD-10-CM | POA: Diagnosis not present

## 2019-02-04 DIAGNOSIS — D509 Iron deficiency anemia, unspecified: Secondary | ICD-10-CM | POA: Diagnosis not present

## 2019-02-04 DIAGNOSIS — D631 Anemia in chronic kidney disease: Secondary | ICD-10-CM | POA: Diagnosis not present

## 2019-02-04 DIAGNOSIS — E1122 Type 2 diabetes mellitus with diabetic chronic kidney disease: Secondary | ICD-10-CM | POA: Diagnosis not present

## 2019-02-04 DIAGNOSIS — E211 Secondary hyperparathyroidism, not elsewhere classified: Secondary | ICD-10-CM | POA: Diagnosis not present

## 2019-02-04 DIAGNOSIS — N186 End stage renal disease: Secondary | ICD-10-CM | POA: Diagnosis not present

## 2019-02-04 DIAGNOSIS — N2581 Secondary hyperparathyroidism of renal origin: Secondary | ICD-10-CM | POA: Diagnosis not present

## 2019-02-07 DIAGNOSIS — N2581 Secondary hyperparathyroidism of renal origin: Secondary | ICD-10-CM | POA: Diagnosis not present

## 2019-02-07 DIAGNOSIS — D509 Iron deficiency anemia, unspecified: Secondary | ICD-10-CM | POA: Diagnosis not present

## 2019-02-07 DIAGNOSIS — N186 End stage renal disease: Secondary | ICD-10-CM | POA: Diagnosis not present

## 2019-02-07 DIAGNOSIS — D631 Anemia in chronic kidney disease: Secondary | ICD-10-CM | POA: Diagnosis not present

## 2019-02-07 DIAGNOSIS — E211 Secondary hyperparathyroidism, not elsewhere classified: Secondary | ICD-10-CM | POA: Diagnosis not present

## 2019-02-07 DIAGNOSIS — E1122 Type 2 diabetes mellitus with diabetic chronic kidney disease: Secondary | ICD-10-CM | POA: Diagnosis not present

## 2019-02-08 DIAGNOSIS — Z992 Dependence on renal dialysis: Secondary | ICD-10-CM | POA: Diagnosis not present

## 2019-02-08 DIAGNOSIS — N390 Urinary tract infection, site not specified: Secondary | ICD-10-CM | POA: Diagnosis not present

## 2019-02-08 DIAGNOSIS — B9629 Other Escherichia coli [E. coli] as the cause of diseases classified elsewhere: Secondary | ICD-10-CM | POA: Diagnosis not present

## 2019-02-08 DIAGNOSIS — R198 Other specified symptoms and signs involving the digestive system and abdomen: Secondary | ICD-10-CM | POA: Diagnosis not present

## 2019-02-09 DIAGNOSIS — N186 End stage renal disease: Secondary | ICD-10-CM | POA: Diagnosis not present

## 2019-02-09 DIAGNOSIS — E211 Secondary hyperparathyroidism, not elsewhere classified: Secondary | ICD-10-CM | POA: Diagnosis not present

## 2019-02-09 DIAGNOSIS — A042 Enteroinvasive Escherichia coli infection: Secondary | ICD-10-CM | POA: Diagnosis not present

## 2019-02-09 DIAGNOSIS — N2581 Secondary hyperparathyroidism of renal origin: Secondary | ICD-10-CM | POA: Diagnosis not present

## 2019-02-09 DIAGNOSIS — D509 Iron deficiency anemia, unspecified: Secondary | ICD-10-CM | POA: Diagnosis not present

## 2019-02-09 DIAGNOSIS — D631 Anemia in chronic kidney disease: Secondary | ICD-10-CM | POA: Diagnosis not present

## 2019-02-09 DIAGNOSIS — E1122 Type 2 diabetes mellitus with diabetic chronic kidney disease: Secondary | ICD-10-CM | POA: Diagnosis not present

## 2019-02-10 ENCOUNTER — Ambulatory Visit (INDEPENDENT_AMBULATORY_CARE_PROVIDER_SITE_OTHER): Payer: Medicare Other | Admitting: Physician Assistant

## 2019-02-11 DIAGNOSIS — N2581 Secondary hyperparathyroidism of renal origin: Secondary | ICD-10-CM | POA: Diagnosis not present

## 2019-02-11 DIAGNOSIS — E1122 Type 2 diabetes mellitus with diabetic chronic kidney disease: Secondary | ICD-10-CM | POA: Diagnosis not present

## 2019-02-11 DIAGNOSIS — D631 Anemia in chronic kidney disease: Secondary | ICD-10-CM | POA: Diagnosis not present

## 2019-02-11 DIAGNOSIS — E211 Secondary hyperparathyroidism, not elsewhere classified: Secondary | ICD-10-CM | POA: Diagnosis not present

## 2019-02-11 DIAGNOSIS — D509 Iron deficiency anemia, unspecified: Secondary | ICD-10-CM | POA: Diagnosis not present

## 2019-02-11 DIAGNOSIS — N186 End stage renal disease: Secondary | ICD-10-CM | POA: Diagnosis not present

## 2019-02-14 DIAGNOSIS — N2581 Secondary hyperparathyroidism of renal origin: Secondary | ICD-10-CM | POA: Diagnosis not present

## 2019-02-14 DIAGNOSIS — D509 Iron deficiency anemia, unspecified: Secondary | ICD-10-CM | POA: Diagnosis not present

## 2019-02-14 DIAGNOSIS — E211 Secondary hyperparathyroidism, not elsewhere classified: Secondary | ICD-10-CM | POA: Diagnosis not present

## 2019-02-14 DIAGNOSIS — E1122 Type 2 diabetes mellitus with diabetic chronic kidney disease: Secondary | ICD-10-CM | POA: Diagnosis not present

## 2019-02-14 DIAGNOSIS — N186 End stage renal disease: Secondary | ICD-10-CM | POA: Diagnosis not present

## 2019-02-14 DIAGNOSIS — D631 Anemia in chronic kidney disease: Secondary | ICD-10-CM | POA: Diagnosis not present

## 2019-02-16 DIAGNOSIS — E211 Secondary hyperparathyroidism, not elsewhere classified: Secondary | ICD-10-CM | POA: Diagnosis not present

## 2019-02-16 DIAGNOSIS — D509 Iron deficiency anemia, unspecified: Secondary | ICD-10-CM | POA: Diagnosis not present

## 2019-02-16 DIAGNOSIS — N186 End stage renal disease: Secondary | ICD-10-CM | POA: Diagnosis not present

## 2019-02-16 DIAGNOSIS — D631 Anemia in chronic kidney disease: Secondary | ICD-10-CM | POA: Diagnosis not present

## 2019-02-16 DIAGNOSIS — E1122 Type 2 diabetes mellitus with diabetic chronic kidney disease: Secondary | ICD-10-CM | POA: Diagnosis not present

## 2019-02-16 DIAGNOSIS — N2581 Secondary hyperparathyroidism of renal origin: Secondary | ICD-10-CM | POA: Diagnosis not present

## 2019-02-17 ENCOUNTER — Ambulatory Visit: Payer: Medicare Other | Admitting: Primary Care

## 2019-02-17 ENCOUNTER — Ambulatory Visit: Payer: Medicare Other | Admitting: Pulmonary Disease

## 2019-02-18 DIAGNOSIS — N2581 Secondary hyperparathyroidism of renal origin: Secondary | ICD-10-CM | POA: Diagnosis not present

## 2019-02-18 DIAGNOSIS — E1122 Type 2 diabetes mellitus with diabetic chronic kidney disease: Secondary | ICD-10-CM | POA: Diagnosis not present

## 2019-02-18 DIAGNOSIS — E211 Secondary hyperparathyroidism, not elsewhere classified: Secondary | ICD-10-CM | POA: Diagnosis not present

## 2019-02-18 DIAGNOSIS — D631 Anemia in chronic kidney disease: Secondary | ICD-10-CM | POA: Diagnosis not present

## 2019-02-18 DIAGNOSIS — N186 End stage renal disease: Secondary | ICD-10-CM | POA: Diagnosis not present

## 2019-02-18 DIAGNOSIS — D509 Iron deficiency anemia, unspecified: Secondary | ICD-10-CM | POA: Diagnosis not present

## 2019-02-20 DIAGNOSIS — N186 End stage renal disease: Secondary | ICD-10-CM | POA: Diagnosis not present

## 2019-02-20 DIAGNOSIS — Z992 Dependence on renal dialysis: Secondary | ICD-10-CM | POA: Diagnosis not present

## 2019-02-20 DIAGNOSIS — E1122 Type 2 diabetes mellitus with diabetic chronic kidney disease: Secondary | ICD-10-CM | POA: Diagnosis not present

## 2019-02-21 DIAGNOSIS — N186 End stage renal disease: Secondary | ICD-10-CM | POA: Diagnosis not present

## 2019-02-21 DIAGNOSIS — D631 Anemia in chronic kidney disease: Secondary | ICD-10-CM | POA: Diagnosis not present

## 2019-02-21 DIAGNOSIS — N2581 Secondary hyperparathyroidism of renal origin: Secondary | ICD-10-CM | POA: Diagnosis not present

## 2019-02-21 DIAGNOSIS — E211 Secondary hyperparathyroidism, not elsewhere classified: Secondary | ICD-10-CM | POA: Diagnosis not present

## 2019-02-21 DIAGNOSIS — E1122 Type 2 diabetes mellitus with diabetic chronic kidney disease: Secondary | ICD-10-CM | POA: Diagnosis not present

## 2019-02-21 DIAGNOSIS — D509 Iron deficiency anemia, unspecified: Secondary | ICD-10-CM | POA: Diagnosis not present

## 2019-02-22 DIAGNOSIS — H401132 Primary open-angle glaucoma, bilateral, moderate stage: Secondary | ICD-10-CM | POA: Diagnosis not present

## 2019-02-22 DIAGNOSIS — H524 Presbyopia: Secondary | ICD-10-CM | POA: Diagnosis not present

## 2019-02-23 DIAGNOSIS — N2581 Secondary hyperparathyroidism of renal origin: Secondary | ICD-10-CM | POA: Diagnosis not present

## 2019-02-23 DIAGNOSIS — N186 End stage renal disease: Secondary | ICD-10-CM | POA: Diagnosis not present

## 2019-02-23 DIAGNOSIS — D631 Anemia in chronic kidney disease: Secondary | ICD-10-CM | POA: Diagnosis not present

## 2019-02-23 DIAGNOSIS — D509 Iron deficiency anemia, unspecified: Secondary | ICD-10-CM | POA: Diagnosis not present

## 2019-02-23 DIAGNOSIS — E211 Secondary hyperparathyroidism, not elsewhere classified: Secondary | ICD-10-CM | POA: Diagnosis not present

## 2019-02-23 DIAGNOSIS — E1122 Type 2 diabetes mellitus with diabetic chronic kidney disease: Secondary | ICD-10-CM | POA: Diagnosis not present

## 2019-02-25 DIAGNOSIS — E1122 Type 2 diabetes mellitus with diabetic chronic kidney disease: Secondary | ICD-10-CM | POA: Diagnosis not present

## 2019-02-25 DIAGNOSIS — N2581 Secondary hyperparathyroidism of renal origin: Secondary | ICD-10-CM | POA: Diagnosis not present

## 2019-02-25 DIAGNOSIS — D509 Iron deficiency anemia, unspecified: Secondary | ICD-10-CM | POA: Diagnosis not present

## 2019-02-25 DIAGNOSIS — E211 Secondary hyperparathyroidism, not elsewhere classified: Secondary | ICD-10-CM | POA: Diagnosis not present

## 2019-02-25 DIAGNOSIS — N186 End stage renal disease: Secondary | ICD-10-CM | POA: Diagnosis not present

## 2019-02-25 DIAGNOSIS — D631 Anemia in chronic kidney disease: Secondary | ICD-10-CM | POA: Diagnosis not present

## 2019-02-28 DIAGNOSIS — E1122 Type 2 diabetes mellitus with diabetic chronic kidney disease: Secondary | ICD-10-CM | POA: Diagnosis not present

## 2019-02-28 DIAGNOSIS — D509 Iron deficiency anemia, unspecified: Secondary | ICD-10-CM | POA: Diagnosis not present

## 2019-02-28 DIAGNOSIS — D631 Anemia in chronic kidney disease: Secondary | ICD-10-CM | POA: Diagnosis not present

## 2019-02-28 DIAGNOSIS — E211 Secondary hyperparathyroidism, not elsewhere classified: Secondary | ICD-10-CM | POA: Diagnosis not present

## 2019-02-28 DIAGNOSIS — N186 End stage renal disease: Secondary | ICD-10-CM | POA: Diagnosis not present

## 2019-02-28 DIAGNOSIS — N2581 Secondary hyperparathyroidism of renal origin: Secondary | ICD-10-CM | POA: Diagnosis not present

## 2019-03-01 ENCOUNTER — Ambulatory Visit (INDEPENDENT_AMBULATORY_CARE_PROVIDER_SITE_OTHER): Payer: Medicare Other | Admitting: Internal Medicine

## 2019-03-01 ENCOUNTER — Encounter: Payer: Self-pay | Admitting: Internal Medicine

## 2019-03-01 ENCOUNTER — Other Ambulatory Visit (INDEPENDENT_AMBULATORY_CARE_PROVIDER_SITE_OTHER): Payer: Medicare Other

## 2019-03-01 VITALS — BP 106/62 | HR 68 | Ht 64.5 in | Wt 149.0 lb

## 2019-03-01 DIAGNOSIS — R110 Nausea: Secondary | ICD-10-CM

## 2019-03-01 DIAGNOSIS — R634 Abnormal weight loss: Secondary | ICD-10-CM

## 2019-03-01 DIAGNOSIS — R109 Unspecified abdominal pain: Secondary | ICD-10-CM | POA: Diagnosis not present

## 2019-03-01 DIAGNOSIS — R197 Diarrhea, unspecified: Secondary | ICD-10-CM

## 2019-03-01 LAB — BUN: BUN: 24 mg/dL — ABNORMAL HIGH (ref 6–23)

## 2019-03-01 LAB — CREATININE, SERUM: Creatinine, Ser: 3.63 mg/dL — ABNORMAL HIGH (ref 0.40–1.20)

## 2019-03-01 MED ORDER — PROMETHAZINE HCL 25 MG PO TABS: 25.0000 mg | ORAL_TABLET | Freq: Three times a day (TID) | ORAL | 1 refills | Status: DC | PRN

## 2019-03-01 MED ORDER — LOPERAMIDE HCL 2 MG PO TABS: 4.0000 mg | ORAL_TABLET | Freq: Three times a day (TID) | ORAL | 2 refills | Status: AC | PRN

## 2019-03-01 NOTE — Progress Notes (Signed)
   Subjective:    Patient ID: [name] James, female    DOB: 1936-08-14, 83 y.o.   MRN: [id number]  HPI [name] is an 83 yo female with PMH of GERD, Barrett's esophagus, gastritis, anemia, ESRD on HD (MWF), COPD on oxygen, remote breast cancer who is here for follow-up.  She is here alone today and was last seen on 01/21/2018.  She reports that she continues to struggle with loose stools and feels that she has not received Imodium at the dose needed.  She states she also needs more than 2 mg to control her diarrhea.  Diarrhea is not every day but it does happen multiple days per week after eating.  No blood in her stool or melena.  She states she was treated with ciprofloxacin for a urinary tract infection several weeks ago and while on antibiotic she had no loose stools.  She is using Phenergan as needed for nausea.  Her nausea can happen with or without the need to have a bowel movement.  She at times gets nausea after a hemodialysis session and on several occasions has vomited.  No report of hematemesis.  He reports her appetite is stable though not great and she is concerned about continued weight loss.  She feels she has lost another 15 pounds, but overall is down about 100 pounds in the last 12 to 18 months.  She will have lower abdominal discomfort before bowel movement on occasion.   Review of Systems As per HPI, otherwise negative  Current Medications, Allergies, Past Medical History, Past Surgical History, Family History and Social History were reviewed in [employer] record.     Objective:   Physical Exam BP 106/62   Pulse 68   Ht 5' 4.5" (1.638 m)   Wt 149 lb (67.6 kg)   BMI 25.18 kg/m  Gen: awake, alert, NAD, chronically ill-appearing HEENT: anicteric, op clear CV: RRR, no mrg Pulm: CTA b/l Abd: soft, NT/ND, +BS throughout Ext: no c/c/e Neuro: nonfocal     Assessment & Plan:   83 yo female with PMH of GERD, Barrett's esophagus, gastritis,  anemia, ESRD on HD (MWF), COPD on oxygen, remote breast cancer who is here for follow-up.    1.  Intermittent diarrhea/weight loss/nausea --her loose stools and nausea are chronic.  She has had progressive weight loss.  She is worried about her weight loss and requests abdominal imaging.  She had a CT scan in 2017 of her abdomen pelvis which I reviewed.  I do not feel based on her overall comorbidity that upper endoscopy or colonoscopy is warranted.  After discussing imaging further she wishes to proceed.  We will perform CT scan of the abdomen pelvis with contrast. --Continue Imodium but increase to 4 mg every 8 hours as needed for diarrhea/loose stools --Continue Phenergan 25 mg every 8 hours as needed for nausea --I offered cholestyramine but she prefers to stick with Imodium  25 minutes spent with the patient today. Greater than 50% was spent in counseling and coordination of care with the patient

## 2019-03-01 NOTE — Patient Instructions (Addendum)
You have been scheduled for a CT scan of the abdomen and pelvis at [facility]. You are scheduled on 03/08/2019 at 4:00 pm. You should arrive 15 minutes prior to your appointment time for registration. Please follow the written instructions below on the day of your exam:  WARNING: IF YOU ARE ALLERGIC TO IODINE/X-RAY DYE, PLEASE NOTIFY RADIOLOGY IMMEDIATELY AT [phone]! YOU WILL BE GIVEN A 13 HOUR PREMEDICATION PREP.  1) Do not eat or drink anything after 12:00 pm (4 hours prior to your test) 2) You have been given 2 bottles of oral contrast to drink. The solution may taste better if refrigerated, but do NOT add ice or any other liquid to this solution. Shake well before drinking.    Drink 1 bottle of contrast @ 2:00 pm (2 hours prior to your exam)  Drink 1 bottle of contrast @ 3:00 pm (1 hour prior to your exam)  You may take any medications as prescribed with a small amount of water, if necessary. If you take any of the following medications: METFORMIN, GLUCOPHAGE, GLUCOVANCE, AVANDAMET, RIOMET, FORTAMET, [place] MET, JANUMET, GLUMETZA or METAGLIP, you MAY be asked to HOLD this medication 48 hours AFTER the exam.  The purpose of you drinking the oral contrast is to aid in the visualization of your intestinal tract. The contrast solution may cause some diarrhea. Depending on your individual set of symptoms, you may also receive an intravenous injection of x-ray contrast/dye. Plan on being at [facility] for 30 minutes or longer, depending on the type of exam you are having performed.  This test typically takes 30-45 minutes to complete.  If you have any questions regarding your exam or if you need to reschedule, you may call the CT department at [phone] between the hours of 8:00 am and 5:00 pm, Monday-Friday.  __________________________________________________________________ Your provider has requested that you go to the basement level for lab work before leaving today.  Press "B" on the elevator. The lab is located at the first door on the left as you exit the elevator.  Please increase your imodium to 4 mg every 8 hours as needed for diarrhea.  Take phenergan 25 mg every 8 hours as needed for nausea/vomiting.  If you are age 50 or older, your body mass index should be between 23-30. Your Body mass index is 25.18 kg/m. If this is out of the aforementioned range listed, please consider follow up with your Primary Care Provider.  If you are age 68 or younger, your body mass index should be between 19-25. Your Body mass index is 25.18 kg/m. If this is out of the aformentioned range listed, please consider follow up with your Primary Care Provider.

## 2019-03-02 DIAGNOSIS — E211 Secondary hyperparathyroidism, not elsewhere classified: Secondary | ICD-10-CM | POA: Diagnosis not present

## 2019-03-02 DIAGNOSIS — E1122 Type 2 diabetes mellitus with diabetic chronic kidney disease: Secondary | ICD-10-CM | POA: Diagnosis not present

## 2019-03-02 DIAGNOSIS — N186 End stage renal disease: Secondary | ICD-10-CM | POA: Diagnosis not present

## 2019-03-02 DIAGNOSIS — N2581 Secondary hyperparathyroidism of renal origin: Secondary | ICD-10-CM | POA: Diagnosis not present

## 2019-03-02 DIAGNOSIS — D631 Anemia in chronic kidney disease: Secondary | ICD-10-CM | POA: Diagnosis not present

## 2019-03-02 DIAGNOSIS — D509 Iron deficiency anemia, unspecified: Secondary | ICD-10-CM | POA: Diagnosis not present

## 2019-03-03 ENCOUNTER — Telehealth: Payer: Self-pay | Admitting: Internal Medicine

## 2019-03-03 DIAGNOSIS — R498 Other voice and resonance disorders: Secondary | ICD-10-CM | POA: Diagnosis not present

## 2019-03-03 DIAGNOSIS — M6281 Muscle weakness (generalized): Secondary | ICD-10-CM | POA: Diagnosis not present

## 2019-03-03 DIAGNOSIS — R293 Abnormal posture: Secondary | ICD-10-CM | POA: Diagnosis not present

## 2019-03-03 DIAGNOSIS — R41841 Cognitive communication deficit: Secondary | ICD-10-CM | POA: Diagnosis not present

## 2019-03-03 DIAGNOSIS — I480 Paroxysmal atrial fibrillation: Secondary | ICD-10-CM | POA: Diagnosis not present

## 2019-03-03 DIAGNOSIS — R001 Bradycardia, unspecified: Secondary | ICD-10-CM | POA: Diagnosis not present

## 2019-03-03 DIAGNOSIS — J9611 Chronic respiratory failure with hypoxia: Secondary | ICD-10-CM | POA: Diagnosis not present

## 2019-03-03 NOTE — Telephone Encounter (Signed)
Pt would like to speak with you regarding CT scan that she is scheduled for.

## 2019-03-03 NOTE — Telephone Encounter (Signed)
Patient personally felt more strongly about the scan then did.  Her symptoms are chronic and at this point well managed.  Okay to cancel CT

## 2019-03-03 NOTE — Telephone Encounter (Signed)
Pt states she was told at dialysis today that she cannot drink contrast or have IV contrast. She wants to know if she just needs to cancel the CT scan. Please advise.

## 2019-03-03 NOTE — Telephone Encounter (Signed)
CT scan cancelled, pt aware.

## 2019-03-04 DIAGNOSIS — E211 Secondary hyperparathyroidism, not elsewhere classified: Secondary | ICD-10-CM | POA: Diagnosis not present

## 2019-03-04 DIAGNOSIS — E1122 Type 2 diabetes mellitus with diabetic chronic kidney disease: Secondary | ICD-10-CM | POA: Diagnosis not present

## 2019-03-04 DIAGNOSIS — D631 Anemia in chronic kidney disease: Secondary | ICD-10-CM | POA: Diagnosis not present

## 2019-03-04 DIAGNOSIS — D509 Iron deficiency anemia, unspecified: Secondary | ICD-10-CM | POA: Diagnosis not present

## 2019-03-04 DIAGNOSIS — N2581 Secondary hyperparathyroidism of renal origin: Secondary | ICD-10-CM | POA: Diagnosis not present

## 2019-03-04 DIAGNOSIS — N186 End stage renal disease: Secondary | ICD-10-CM | POA: Diagnosis not present

## 2019-03-07 DIAGNOSIS — N2581 Secondary hyperparathyroidism of renal origin: Secondary | ICD-10-CM | POA: Diagnosis not present

## 2019-03-07 DIAGNOSIS — E211 Secondary hyperparathyroidism, not elsewhere classified: Secondary | ICD-10-CM | POA: Diagnosis not present

## 2019-03-07 DIAGNOSIS — D631 Anemia in chronic kidney disease: Secondary | ICD-10-CM | POA: Diagnosis not present

## 2019-03-07 DIAGNOSIS — D509 Iron deficiency anemia, unspecified: Secondary | ICD-10-CM | POA: Diagnosis not present

## 2019-03-07 DIAGNOSIS — E1122 Type 2 diabetes mellitus with diabetic chronic kidney disease: Secondary | ICD-10-CM | POA: Diagnosis not present

## 2019-03-07 DIAGNOSIS — N186 End stage renal disease: Secondary | ICD-10-CM | POA: Diagnosis not present

## 2019-03-08 ENCOUNTER — Ambulatory Visit (HOSPITAL_COMMUNITY): Payer: Medicare Other

## 2019-03-09 DIAGNOSIS — D631 Anemia in chronic kidney disease: Secondary | ICD-10-CM | POA: Diagnosis not present

## 2019-03-09 DIAGNOSIS — D509 Iron deficiency anemia, unspecified: Secondary | ICD-10-CM | POA: Diagnosis not present

## 2019-03-09 DIAGNOSIS — E211 Secondary hyperparathyroidism, not elsewhere classified: Secondary | ICD-10-CM | POA: Diagnosis not present

## 2019-03-09 DIAGNOSIS — E1122 Type 2 diabetes mellitus with diabetic chronic kidney disease: Secondary | ICD-10-CM | POA: Diagnosis not present

## 2019-03-09 DIAGNOSIS — N2581 Secondary hyperparathyroidism of renal origin: Secondary | ICD-10-CM | POA: Diagnosis not present

## 2019-03-09 DIAGNOSIS — N186 End stage renal disease: Secondary | ICD-10-CM | POA: Diagnosis not present

## 2019-03-11 DIAGNOSIS — D509 Iron deficiency anemia, unspecified: Secondary | ICD-10-CM | POA: Diagnosis not present

## 2019-03-11 DIAGNOSIS — E1122 Type 2 diabetes mellitus with diabetic chronic kidney disease: Secondary | ICD-10-CM | POA: Diagnosis not present

## 2019-03-11 DIAGNOSIS — D631 Anemia in chronic kidney disease: Secondary | ICD-10-CM | POA: Diagnosis not present

## 2019-03-11 DIAGNOSIS — N186 End stage renal disease: Secondary | ICD-10-CM | POA: Diagnosis not present

## 2019-03-11 DIAGNOSIS — E211 Secondary hyperparathyroidism, not elsewhere classified: Secondary | ICD-10-CM | POA: Diagnosis not present

## 2019-03-11 DIAGNOSIS — N2581 Secondary hyperparathyroidism of renal origin: Secondary | ICD-10-CM | POA: Diagnosis not present

## 2019-03-13 ENCOUNTER — Other Ambulatory Visit: Payer: Self-pay

## 2019-03-13 ENCOUNTER — Emergency Department (HOSPITAL_COMMUNITY): Payer: Medicare Other

## 2019-03-13 ENCOUNTER — Observation Stay (HOSPITAL_COMMUNITY)
Admission: EM | Admit: 2019-03-13 | Discharge: 2019-03-14 | Disposition: A | Discharge status: Home / Self Care | Payer: Medicare Other | Attending: Internal Medicine | Admitting: Internal Medicine

## 2019-03-13 ENCOUNTER — Encounter (HOSPITAL_COMMUNITY): Payer: Self-pay | Admitting: Emergency Medicine

## 2019-03-13 DIAGNOSIS — J90 Pleural effusion, not elsewhere classified: Secondary | ICD-10-CM | POA: Insufficient documentation

## 2019-03-13 DIAGNOSIS — R112 Nausea with vomiting, unspecified: Secondary | ICD-10-CM | POA: Diagnosis not present

## 2019-03-13 DIAGNOSIS — E1151 Type 2 diabetes mellitus with diabetic peripheral angiopathy without gangrene: Secondary | ICD-10-CM | POA: Diagnosis not present

## 2019-03-13 DIAGNOSIS — R197 Diarrhea, unspecified: Secondary | ICD-10-CM | POA: Diagnosis not present

## 2019-03-13 DIAGNOSIS — Z880 Allergy status to penicillin: Secondary | ICD-10-CM | POA: Diagnosis not present

## 2019-03-13 DIAGNOSIS — I132 Hypertensive heart and chronic kidney disease with heart failure and with stage 5 chronic kidney disease, or end stage renal disease: Secondary | ICD-10-CM | POA: Diagnosis not present

## 2019-03-13 DIAGNOSIS — R531 Weakness: Secondary | ICD-10-CM | POA: Diagnosis present

## 2019-03-13 DIAGNOSIS — Z992 Dependence on renal dialysis: Secondary | ICD-10-CM | POA: Insufficient documentation

## 2019-03-13 DIAGNOSIS — Z881 Allergy status to other antibiotic agents status: Secondary | ICD-10-CM | POA: Insufficient documentation

## 2019-03-13 DIAGNOSIS — N390 Urinary tract infection, site not specified: Secondary | ICD-10-CM | POA: Diagnosis present

## 2019-03-13 DIAGNOSIS — R300 Dysuria: Secondary | ICD-10-CM

## 2019-03-13 DIAGNOSIS — Z791 Long term (current) use of non-steroidal anti-inflammatories (NSAID): Secondary | ICD-10-CM | POA: Diagnosis not present

## 2019-03-13 DIAGNOSIS — R5381 Other malaise: Secondary | ICD-10-CM | POA: Diagnosis present

## 2019-03-13 DIAGNOSIS — I120 Hypertensive chronic kidney disease with stage 5 chronic kidney disease or end stage renal disease: Secondary | ICD-10-CM | POA: Diagnosis not present

## 2019-03-13 DIAGNOSIS — J9611 Chronic respiratory failure with hypoxia: Secondary | ICD-10-CM | POA: Diagnosis not present

## 2019-03-13 DIAGNOSIS — Z79899 Other long term (current) drug therapy: Secondary | ICD-10-CM | POA: Diagnosis not present

## 2019-03-13 DIAGNOSIS — K529 Noninfective gastroenteritis and colitis, unspecified: Secondary | ICD-10-CM

## 2019-03-13 DIAGNOSIS — I10 Essential (primary) hypertension: Secondary | ICD-10-CM | POA: Diagnosis not present

## 2019-03-13 DIAGNOSIS — R0902 Hypoxemia: Secondary | ICD-10-CM | POA: Diagnosis not present

## 2019-03-13 DIAGNOSIS — N186 End stage renal disease: Secondary | ICD-10-CM | POA: Diagnosis not present

## 2019-03-13 DIAGNOSIS — I959 Hypotension, unspecified: Secondary | ICD-10-CM | POA: Diagnosis not present

## 2019-03-13 DIAGNOSIS — I9589 Other hypotension: Secondary | ICD-10-CM | POA: Diagnosis not present

## 2019-03-13 DIAGNOSIS — E1122 Type 2 diabetes mellitus with diabetic chronic kidney disease: Secondary | ICD-10-CM | POA: Diagnosis not present

## 2019-03-13 DIAGNOSIS — Z7982 Long term (current) use of aspirin: Secondary | ICD-10-CM | POA: Diagnosis not present

## 2019-03-13 DIAGNOSIS — N3000 Acute cystitis without hematuria: Secondary | ICD-10-CM | POA: Diagnosis not present

## 2019-03-13 DIAGNOSIS — Z885 Allergy status to narcotic agent status: Secondary | ICD-10-CM | POA: Insufficient documentation

## 2019-03-13 DIAGNOSIS — R05 Cough: Secondary | ICD-10-CM | POA: Diagnosis not present

## 2019-03-13 HISTORY — DX: Abnormal results of liver function studies: R94.5

## 2019-03-13 HISTORY — DX: Long term (current) use of insulin: Z79.4

## 2019-03-13 HISTORY — DX: Type 2 diabetes mellitus with diabetic polyneuropathy: E11.42

## 2019-03-13 HISTORY — DX: Morbid (severe) obesity due to excess calories: E66.01

## 2019-03-13 HISTORY — DX: Other specified abnormal findings of blood chemistry: R79.89

## 2019-03-13 HISTORY — DX: Bradycardia, unspecified: R00.1

## 2019-03-13 HISTORY — DX: Other long term (current) drug therapy: Z79.899

## 2019-03-13 HISTORY — DX: Iron deficiency anemia, unspecified: D50.9

## 2019-03-13 LAB — COMPREHENSIVE METABOLIC PANEL
ALT: 27 U/L (ref 0–44)
AST: 38 U/L (ref 15–41)
Albumin: 3 g/dL — ABNORMAL LOW (ref 3.5–5.0)
Alkaline Phosphatase: 87 U/L (ref 38–126)
Anion gap: 16 — ABNORMAL HIGH (ref 5–15)
BUN: 27 mg/dL — ABNORMAL HIGH (ref 8–23)
CO2: 27 mmol/L (ref 22–32)
Calcium: 8 mg/dL — ABNORMAL LOW (ref 8.9–10.3)
Chloride: 96 mmol/L — ABNORMAL LOW (ref 98–111)
Creatinine, Ser: 4.46 mg/dL — ABNORMAL HIGH (ref 0.44–1.00)
GFR calc Af Amer: 10 mL/min — ABNORMAL LOW (ref >60)
GFR calc non Af Amer: 9 mL/min — ABNORMAL LOW (ref >60)
Glucose, Bld: 120 mg/dL — ABNORMAL HIGH (ref 70–99)
Potassium: 3.1 mmol/L — ABNORMAL LOW (ref 3.5–5.1)
SODIUM: 139 mmol/L (ref 135–145)
Total Bilirubin: 0.3 mg/dL (ref 0.3–1.2)
Total Protein: 7.2 g/dL (ref 6.5–8.1)

## 2019-03-13 LAB — CBC WITH DIFFERENTIAL/PLATELET
Abs Immature Granulocytes: 0.03 10*3/uL (ref 0.00–0.07)
Basophils Absolute: 0 10*3/uL (ref 0.0–0.1)
Basophils Relative: 1 %
Eosinophils Absolute: 0.1 10*3/uL (ref 0.0–0.5)
Eosinophils Relative: 1 %
HCT: 41.2 % (ref 36.0–46.0)
Hemoglobin: 12.4 g/dL (ref 12.0–15.0)
Immature Granulocytes: 1 %
Lymphocytes Relative: 15 %
Lymphs Abs: 1 10*3/uL (ref 0.7–4.0)
MCH: 31.7 pg (ref 26.0–34.0)
MCHC: 30.1 g/dL (ref 30.0–36.0)
MCV: 105.4 fL — ABNORMAL HIGH (ref 80.0–100.0)
Monocytes Absolute: 0.5 10*3/uL (ref 0.1–1.0)
Monocytes Relative: 8 %
Neutro Abs: 4.9 10*3/uL (ref 1.7–7.7)
Neutrophils Relative %: 74 %
Platelets: 179 10*3/uL (ref 150–400)
RBC: 3.91 MIL/uL (ref 3.87–5.11)
RDW: 15.4 % (ref 11.5–15.5)
WBC: 6.6 10*3/uL (ref 4.0–10.5)
nRBC: 0 % (ref 0.0–0.2)

## 2019-03-13 LAB — LACTIC ACID, PLASMA: LACTIC ACID, VENOUS: 1.2 mmol/L (ref 0.5–1.9)

## 2019-03-13 LAB — LIPASE, BLOOD: Lipase: 24 U/L (ref 11–51)

## 2019-03-13 MED ORDER — ONDANSETRON HCL 4 MG/2ML IJ SOLN
4.0000 mg | Freq: Once | INTRAMUSCULAR | Status: AC
  Administered 2019-03-13: 4 mg via INTRAVENOUS
  Filled 2019-03-13: qty 2

## 2019-03-13 MED ORDER — SODIUM CHLORIDE 0.9 % IV BOLUS
500.0000 mL | Freq: Once | INTRAVENOUS | Status: AC
  Administered 2019-03-13: 500 mL via INTRAVENOUS

## 2019-03-13 NOTE — H&P (Signed)
Date: 03/13/2019               Patient Name:  [name] James MRN: [id number]  DOB: [birth year] Age / Sex: 83 y.o., female   PCP: [facility] [place] Service: Internal Medicine Teaching Service         Attending Physician: Dr. Aldine Contes, MD    First Contact: Dr. Modena Nunnery, MD Pager: [phone]  Second Contact: Dr. Kathi Ludwig Pager: [phone]       After Hours (After 5p/  First Contact Pager: [phone]  weekends / holidays): Second Contact Pager: [phone]   Chief Complaint: nausea and vomiting  History of Present Illness: [name] James is an 83 year old female with end-stage renal disease on dialysis, type 2 diabetes, HTN, chronic hypoxic respiratory failure 2/2 chronic left pleural effusion and trapped left lung, chronic diastolic heart failure, paroxysmal atrial fibrillation, gastritis, and chronic diarrhea/nausea who presents with nausea and vomiting.  [name] James has a long history of intermittent diarrhea, weight loss, nausea, and dull abdominal pain.  She is followed by Dr. Hilarie Fredrickson with gastroenterology.  [name] James states that she has had intermittent diarrhea over the last several months.  She reports nausea at times after HD sessions and has vomited on several occasions. She states that over the last 2 days she has had worsening nausea, vomiting, diarrhea.  She normally takes Imodium and Phenergan but was unable to keep it down due to the vomiting.  She has overall felt very weak during this time.  She reports that her nurse aide took her blood pressure this afternoon and it was found to be low.  They called the nursing facilities doctor who recommended going to the emergency room.  She states that she has been treated with ciprofloxacin for a urinary tract infection 1 to 2 months ago and while on this antibiotic she had no loose stools.  Per chart review, she has had weight loss-100 pounds in the last 12 to 18 months.  She reports overall good appetite  but has had a decrease in her appetite for last several days. Additionally, she endorses dysuria. Reports cough with greenish sputum but does state to be a chronic issue due to her underlying pleural effusion and trapped lung. She denies SOB, worsening oxygen requirements, chest pain, fevers, chills.  In the ED, she was found to have normal vital signs.  Labs were significant for hypokalemia with potassium of 3.1, elevated creatinine of 4.46 consistent with end-stage renal disease, elevated BUN at 27, and elevated anion gap of 16.  CBC unremarkable and lipase WNL.    Meds:  Current Meds  Medication Sig   acetaminophen (TYLENOL) 325 MG tablet Take 650 mg by mouth every 6 (six) hours as needed for fever ("for vomiting/nausea," per [facility]).    acetaminophen (TYLENOL) 325 MG tablet Take 650 mg by mouth 2 (two) times daily.   allopurinol (ZYLOPRIM) 100 MG tablet Take 100 mg by mouth daily.   aspirin 81 MG chewable tablet Chew 81 mg by mouth daily.   B Complex-C-Folic Acid (NEPHRO VITAMINS) 0.8 MG TABS Take 1 tablet by mouth daily.   Brinzolamide-Brimonidine (SIMBRINZA) 1-0.2 % SUSP Place 1 drop into both eyes 2 (two) times daily.   calcium acetate (PHOSLO) 667 MG capsule Take 1,334 mg by mouth 3 (three) times daily with meals.   diazepam (VALIUM) 5 MG tablet Take 1 tablet (5 mg total) by mouth at bedtime.   diclofenac  sodium (VOLTAREN) 1 % GEL Apply 4 g topically 4 (four) times daily. Applied to right knee   DIMETHICONE, TOPICAL, 5 % CREA Apply 1 application topically See admin instructions. Place a nickel-sized amount onto buttock(s) area 2 times a day for barrier protection   Lidocaine (ASPERCREME LIDOCAINE) 4 % PTCH Place 1 patch onto the skin daily. Applied to right knee (on at 0800 & off at 2000)   loperamide (IMODIUM A-D) 2 MG tablet Take 2 tablets (4 mg total) by mouth every 8 (eight) hours as needed for diarrhea or loose stools (for diarrhea).   loratadine (CLARITIN) 10 MG tablet  Take 10 mg by mouth daily.   meclizine (ANTIVERT) 25 MG tablet Take 25 mg by mouth every 8 (eight) hours as needed for dizziness (vertigo).    Melatonin 3 MG TABS Take 6 mg by mouth at bedtime.    midodrine (PROAMATINE) 10 MG tablet Take 10 mg by mouth See admin instructions. Take 10 mg by mouth WITH MORNING MEDS AT FACILITY on Mon/Wed/Fri- dialysis days Take 10 mg by mouth at dialysis on Mon/Wed/Fri   midodrine (PROAMATINE) 2.5 MG tablet Take 2.5 mg by mouth See admin instructions. 3 times daily on Sun Tues Thurs and Sat , and 2 times daily on Mon Wed and Fri after Dialysis   OXYGEN Inhale 2 L into the lungs continuous.    promethazine (PHENERGAN) 25 MG tablet Take 1 tablet (25 mg total) by mouth every 8 (eight) hours as needed for nausea or vomiting.   traZODone (DESYREL) 50 MG tablet Take 25 mg by mouth at bedtime.      Allergies: Allergies as of 03/13/2019 - Review Complete 03/13/2019  Allergen Reaction Noted   Erythromycin Swelling 02/01/2013   Codeine Palpitations, Rash, and Other (See Comments) 02/01/2013   Erythromycin base Other (See Comments) 03/05/2016   Penicillin g Rash 03/05/2016   Penicillins Rash and Other (See Comments) 02/01/2013   Zantac [ranitidine hcl] Nausea And Vomiting 04/06/2018   Past Medical History:  Diagnosis Date   Anemia, chronic disease    Anxiety    Arthritis    knees   Atrial fibrillation (HCC)    Barrett esophagus    Benign paroxysmal positional vertigo    Breast cancer ([place]) 1980s   bil mastectomies, no radiation or chemo   Cataract    Chest pain, atypical 12/05/2008   R/Lmv- normal perfusion all regions, noe ECG changes    CHF (congestive heart failure) ([place]) 05/13/2011   echo - DP>82%; stage 1 diastolic dysfunction; elevated LV filling pressure, MAC   CKD (chronic kidney disease), stage III (HCC)    Claudication ([place]) 10/30/2005   doppler - normal evaluation, no evidence of aneurysm, diameter reduction, dissection,  compression or vascular abnormality)   Complication of anesthesia    Dyslipidemia    Dysrhythmia    Atrial Fibrillation   Edema    ESRD (end stage renal disease) on dialysis ([place])    "[facility]; [address]; MWF" (07/06/2018)   Family history of adverse reaction to anesthesia    son also has nausea    Fever blister 12/2016   Gastritis and gastroduodenitis    GERD (gastroesophageal reflux disease)    Gout    Hemodialysis-associated hypotension    HLD (hyperlipidemia)    Hypertension    IBS (irritable bowel syndrome)    Insomnia    Morbid obesity ([place])    On home oxygen therapy    "2L; 24/7" (07/06/2018)   PAF (paroxysmal atrial fibrillation) (  [place])    Peripheral neuropathy    Pneumonia 12/2016   PONV (postoperative nausea and vomiting)    Renal cyst    Renal insufficiency 10/30/2005   doppler - abn resistance consistent w/ parenchymal disease   Renovascular hypertension    Type 2 diabetes mellitus with diabetic neuropathy, with long-term current use of insulin (HCC)    Vertigo     Family History:  Family History  Problem Relation Age of Onset   Diabetes Mother    Heart disease Mother    Non-Hodgkin's lymphoma Mother    Heart attack Father    Stroke Father    Parkinson's disease Brother    Lung disease Brother    Heart disease Brother    Multiple myeloma Sister     Social History: She currently lives in assisted living facility.  She has never used tobacco.  She denies alcohol or illicit drug use.  Review of Systems: A complete ROS was negative except as per HPI.   Physical Exam: Blood pressure (!) 113/34, pulse 76, temperature 99.2 F (37.3 C), temperature source Oral, resp. rate 19, weight 67 kg, SpO2 99 %. General: Elderly appearing female lying in bed in no acute distress. HEENT: Normocephalic, atraumatic, EOMI CV: irregular rhythm, normal rate, no murmurs appreciated. Resp: Diminished breath sounds in LLL, normal WOB, normal  oxygen saturations on 2 L supplemental oxygen. MSK: No LE edema, TTP, or erythema bilaterally Skin: Warm and dry Neuro: Alert and oriented Psych: Normal mood, affect, and behavior  EKG: personally reviewed my interpretation is normal sinus rhythm.  CXR: personally reviewed my interpretation is chronic pleural effusion and consolidation in the left lung base.   Assessment & Plan by Problem: Active Problems:   Intractable nausea and vomiting  [name] James is an 83 year old female presents with worsening of her chronic nausea, vomiting, and diarrhea. Differential diagnosis for her symptoms include bacterial overgrowth of the small intestine, irritable bowel syndrome, and infectious etiologies such as C.diff.   Nausea and Vomiting: Symptoms have improved greatly since onset.  - C. difficile pending - GI panel pending - Imodium 4 mg q8h PRN diarrhea - Phenergan 25 mg q4h PRN nausea and vomiting - s/p 500 mL IVF. Start Maintenance IVFs at 50 mL/hr.   Dysuria:  - Ordered urinalysis and urine culture.  End-Stage Renal Disease: HD MWF. Last performed 2 days ago.  - Consult nephrology for inpatient HD  Chronic Hypoxic Respiratory Failure: 2/2 Chronic Left Pleural Effusion and Collapsed Left Lung. - Continue supplemental oxygen  Paroxysmal Atrial Fibrillation: No signs of afib. Normal heart rate. Not a candidate for nodal blocking agents because history of sick sinus syndrome. Not on anticoagulation. - Continue to monitor  Chronic Hypotension: - Continue home Midodrine  Chronic Diastolic Heart Failure: Appears Euvolemic on exam.  - Continue to monitor   FEN/GI: Clear liquids, IVFs per above CODE STATUS: Full DVT PPX: Subcu heparin  Dispo: Admit patient to Inpatient with expected length of stay greater than 2 midnights.  Signed: Carroll Sage, MD 03/13/2019, 11:31 PM

## 2019-03-13 NOTE — ED Triage Notes (Signed)
Pt here from facility. N/V/D for 2 days with cough with green mucus. No fever. Hx of CHF, dialysis MWF. 2L nasal cannula from left lung collapsed in past. 119/68, HR 74, 100% 2 L nasal cannula, CBG 136, temp 98.8

## 2019-03-13 NOTE — ED Provider Notes (Signed)
[facility] EMERGENCY DEPARTMENT Provider Note   CSN: [id number] Arrival date & time: 03/13/19  1933    History   Chief Complaint Chief Complaint  Patient presents with  . Weakness    HPI [name] James is a 83 y.o. female.  She has a history of end-stage renal disease and gets dialysis Monday Wednesday Friday last dialysis 2 days ago on Friday.  She is complaining of 2 days of nausea vomiting and diarrhea.  She said more than 5 episodes less than 10.  She is not sure if there was any blood involvement she does not think so.  She has had a little bit worsened cough than normal.  No shortness of breath no sore throat.  No known fever but she was slightly febrile here on arrival.  She is from a nursing facility.  At baseline she is nonambulatory.     The history is provided by the patient and the EMS personnel.  Emesis  Severity:  Severe Duration:  2 days Timing:  Intermittent Number of daily episodes:  5-10 Quality:  Unable to specify Progression:  Unchanged Chronicity:  New Context: not post-tussive   Relieved by:  None tried Worsened by:  Nothing Ineffective treatments:  None tried Associated symptoms: abdominal pain (cramps), cough and diarrhea   Associated symptoms: no chills, no headaches, no sore throat and no URI     Past Medical History:  Diagnosis Date  . Anemia, chronic disease   . Anxiety   . Arthritis    knees  . Atrial fibrillation ([place])   . Barrett esophagus   . Benign paroxysmal positional vertigo   . Breast cancer ([place]) 1980s   bil mastectomies, no radiation or chemo  . Cataract   . Chest pain, atypical 12/05/2008   R/Lmv- normal perfusion all regions, noe ECG changes   . CHF (congestive heart failure) ([place]) 05/13/2011   echo - KC>12%; stage 1 diastolic dysfunction; elevated LV filling pressure, MAC  . CKD (chronic kidney disease), stage III ([place])   . Claudication ([place]) 10/30/2005   doppler - normal evaluation, no evidence of  aneurysm, diameter reduction, dissection, compression or vascular abnormality)  . Complication of anesthesia   . Dyslipidemia   . Dysrhythmia    Atrial Fibrillation  . Edema   . ESRD (end stage renal disease) on dialysis ([place])    "[facility]; [address]; MWF" (07/06/2018)  . Family history of adverse reaction to anesthesia    son also has nausea   . Fever blister 12/2016  . Gastritis and gastroduodenitis   . GERD (gastroesophageal reflux disease)   . Gout   . Hemodialysis-associated hypotension   . HLD (hyperlipidemia)   . Hypertension   . IBS (irritable bowel syndrome)   . Insomnia   . Morbid obesity ([place])   . On home oxygen therapy    "2L; 24/7" (07/06/2018)  . PAF (paroxysmal atrial fibrillation) ([place])   . Peripheral neuropathy   . Pneumonia 12/2016  . PONV (postoperative nausea and vomiting)   . Renal cyst   . Renal insufficiency 10/30/2005   doppler - abn resistance consistent w/ parenchymal disease  . Renovascular hypertension   . Type 2 diabetes mellitus with diabetic neuropathy, with long-term current use of insulin ([place])   . Vertigo     Patient Active Problem List   Diagnosis Date Noted  . Aortic atherosclerosis ([place]) 12/30/2018  . Hypoalbuminemia due to protein-calorie malnutrition ([place]) 12/30/2018  . Junctional bradycardia 08/25/2018  .  Sick sinus syndrome ([place])   . Symptomatic bradycardia 08/24/2018  . Gout 08/23/2018  . Anxiety 08/23/2018  . Pneumothorax, left 10/13/2017  . Multiple lung nodules on CT 10/06/2017  . Pleural effusion, left 10/06/2017  . Diabetic polyneuropathy associated with type 2 diabetes mellitus ([place]) 07/30/2017  . Onychomycosis of toenail 07/30/2017  . Idiopathic chronic venous hypertension of both lower extremities with inflammation 07/30/2017  . Chronic respiratory failure with hypoxia ([place]) 04/29/2017  . Morbid (severe) obesity due to excess calories ([place]) 04/29/2017  . LFT elevation   . Advance care planning   . Goals of care,  counseling/discussion   . Palliative care by specialist   . Pressure ulcer 01/19/2017  . Long term current use of amiodarone 10/17/2016  . History of pressure ulcer 10/17/2016  . GAD (generalized anxiety disorder) 10/17/2016  . Osteopenia determined by x-ray 04/17/2016  . Controlled type 2 diabetes mellitus with diabetic polyneuropathy, with long-term current use of insulin ([place]) 04/17/2016  . Type 2 diabetes mellitus with diabetic neuropathy ([place]) 06/08/2015  . Type 2 diabetes, controlled, with renal manifestation ([place]) 06/08/2015  . PVD (peripheral vascular disease) ([place]) 06/08/2015  . Swelling of limb-Left arm 04/10/2015  . Nausea with vomiting 01/12/2015  . Normocytic anemia 01/12/2015  . Paroxysmal atrial fibrillation ([place]) 12/22/2014  . Melena   . ESRD on dialysis ([place]) 12/19/2014  . Chronic diastolic (congestive) heart failure ([place]) 12/19/2014  . Palpitation 12/19/2014  . Anemia, chronic disease 12/13/2014  . Hemodialysis-associated hypotension 12/13/2014  . Pulmonary edema 11/10/2014  . Physical deconditioning 10/29/2014  . Iron deficiency anemia   . Gastritis and gastroduodenitis 10/24/2014  . Heme positive stool 10/24/2014  . Morbid obesity ([place]) 10/18/2014  . Edema 10/18/2014  . Essential hypertension 10/17/2014  . DM type 2, uncontrolled, with renal complications ([place]) 84/16/6063  . Dyslipidemia 10/17/2014    Past Surgical History:  Procedure Laterality Date  . A/V FISTULAGRAM Left 03/19/2018   Procedure: A/V FISTULAGRAM;  Surgeon: Angelia Mould, MD;  Location: [facility];  Service: Cardiovascular;  Laterality: Left;  . A/V FISTULAGRAM N/A 12/24/2018   Procedure: A/V FISTULAGRAM - Right Arm;  Surgeon: Angelia Mould, MD;  Location: [facility];  Service: Cardiovascular;  Laterality: N/A;  . A/V SHUNTOGRAM Left 01/19/2018   Procedure: A/V SHUNTOGRAM;  Surgeon: Serafina Mitchell, MD;  Location: [facility];  Service: Cardiovascular;   Laterality: Left;  . ANGIOPLASTY Left 02/12/2015   Procedure: ANGIOPLASTY;  Surgeon: Angelia Mould, MD;  Location: [facility] CATH LAB;  Service: Cardiovascular;  Laterality: Left;  AVF  . AV FISTULA PLACEMENT Left 11/30/2014   Procedure: ARTERIOVENOUS (AV) FISTULA CREATION LEFT ARM;  Surgeon: Angelia Mould, MD;  Location: [facility];  Service: Vascular;  Laterality: Left;  . AV FISTULA PLACEMENT Left 03/20/2015   Procedure:  Inserton of Left Upper Arm Gortex Graft;  Surgeon: Angelia Mould, MD;  Location: [facility];  Service: Vascular;  Laterality: Left;  . AV FISTULA PLACEMENT Left 04/12/2015   Procedure: INSERTION OF LEFT ARM  ARTERIOVENOUS GORE-TEX GRAFT ;  Surgeon: Angelia Mould, MD;  Location: [facility];  Service: Vascular;  Laterality: Left;  . AV FISTULA PLACEMENT Right 06/08/2018   Procedure: INSERTION OF ARTERIOVENOUS (AV) GORE-TEX GRAFT ARM USING 4-7MM X 45CM GORETEX GRAFT;  Surgeon: Angelia Mould, MD;  Location: [facility];  Service: Vascular;  Laterality: Right;  . BACK SURGERY  ~1980  . CATARACT EXTRACTION Right   . CHOLECYSTECTOMY    .  ESOPHAGOGASTRODUODENOSCOPY N/A 10/24/2014   Procedure: ESOPHAGOGASTRODUODENOSCOPY (EGD);  Surgeon: Jerene Bears, MD;  Location[facility] ENDOSCOPY;  Service: Endoscopy;  Laterality: N/A;  . EXCHANGE OF A DIALYSIS CATHETER Left 11/30/2014   Procedure: EXCHANGE OF A DIALYSIS CATHETER, LEFT INTERNAL JUGULAR;  Surgeon: Angelia Mould, MD;  Location: [facility];  Service: Vascular;  Laterality: Left;  . EYE SURGERY Left    transplant  . FISTULOGRAM N/A 02/12/2015   Procedure: FISTULOGRAM;  Surgeon: Angelia Mould, MD;  Location: [facility] CATH LAB;  Service: Cardiovascular;  Laterality: N/A;  . HEMORRHOID SURGERY    . IR AV DIALY SHUNT INTRO NEEDLE/INTRACATH INITIAL W/PTA/IMG RIGHT Right 08/25/2018  . IR AV DIALY SHUNT INTRO NEEDLE/INTRACATH INITIAL W/PTA/IMG RIGHT Right 11/17/2018  . IR US GUIDE VASC ACCESS RIGHT  08/25/2018  . LIGATION  ARTERIOVENOUS GORTEX GRAFT Left 08/27/2018   Procedure: LIGATION ARTERIOVENOUS GORTEX GRAFT ARM;  Surgeon: Angelia Mould, MD;  Location: [facility];  Service: Vascular;  Laterality: Left;  . LIGATION OF ARTERIOVENOUS  FISTULA Left 03/20/2015   Procedure: LIGATION OF ARTERIOVENOUS  FISTULA;  Surgeon: Angelia Mould, MD;  Location: [facility];  Service: Vascular;  Laterality: Left;  Marland Kitchen MASTECTOMY Bilateral ~1982   bil breast reconstruction with implants  . ORIF TIBIA & FIBULA FRACTURES Left 2007   also had left non displaced malleolar fracture.   Marland Kitchen PERIPHERAL VASCULAR BALLOON ANGIOPLASTY Left 01/19/2018   Procedure: PERIPHERAL VASCULAR BALLOON ANGIOPLASTY;  Surgeon: Serafina Mitchell, MD;  Location: [facility];  Service: Cardiovascular;  Laterality: Left;  fistula  . PERIPHERAL VASCULAR BALLOON ANGIOPLASTY Left 03/19/2018   Procedure: PERIPHERAL VASCULAR BALLOON ANGIOPLASTY;  Surgeon: Angelia Mould, MD;  Location: [facility];  Service: Cardiovascular;  Laterality: Left;  UPPER ARM FISTULA  . PERIPHERAL VASCULAR BALLOON ANGIOPLASTY Right 12/24/2018   Procedure: PERIPHERAL VASCULAR BALLOON ANGIOPLASTY;  Surgeon: Angelia Mould, MD;  Location: [facility];  Service: Cardiovascular;  Laterality: Right;  arm fistula  . REMOVAL OF GRAFT Left 04/12/2015   Procedure: REMOVAL OF LEFT ARM ARTERIOVENOUS GORE-TEX GRAFT;  Surgeon: Angelia Mould, MD;  Location: [facility];  Service: Vascular;  Laterality: Left;     OB History   No obstetric history on file.      Home Medications    Prior to Admission medications   Medication Sig Start Date End Date Taking? Authorizing Provider  acetaminophen (TYLENOL) 325 MG tablet Take 650 mg by mouth every 6 (six) hours as needed for fever ("for vomiting/nausea," per [facility]).     [provider]  allopurinol (ZYLOPRIM) 100 MG tablet Take 100 mg by mouth daily.    [provider]  aspirin 81 MG chewable tablet Chew  81 mg by mouth daily.    [provider]  B Complex-C-Folic Acid (NEPHRO VITAMINS) 0.8 MG TABS Take 1 tablet by mouth daily.    [provider]  Brinzolamide-Brimonidine (SIMBRINZA) 1-0.2 % SUSP Place 1 drop into both eyes 2 (two) times daily.    [provider]  calcium elemental as carbonate (BARIATRIC TUMS ULTRA) 400 MG chewable tablet Chew 1,000 mg by mouth 3 (three) times daily with meals.    [provider]  diazepam (VALIUM) 5 MG tablet Take 1 tablet (5 mg total) by mouth at bedtime. 07/08/18   Arrien, Jimmy Picket, MD  diclofenac sodium (VOLTAREN) 1 % GEL Apply 4 g topically 4 (four) times daily. Applied to right knee    [provider]  DIMETHICONE, TOPICAL, 5 % CREA Apply 1 application topically See admin instructions. Place a nickel-sized amount onto buttock(s) area 2 times a day for barrier protection    [provider]  hydrALAZINE (APRESOLINE) 10 MG tablet Take 5 mg by mouth 3 (three) times daily as needed (hypertension >160/90).     [provider]  Lidocaine (ASPERCREME LIDOCAINE) 4 % PTCH Place 1 patch onto the skin daily. Applied to right knee (on at 0800 & off at 2000)    [provider]  loperamide (IMODIUM A-D) 2 MG tablet Take 2 tablets (4 mg total) by mouth every 8 (eight) hours as needed for diarrhea or loose stools (for diarrhea). 03/01/19   Pyrtle, Lajuan Lines, MD  loratadine (CLARITIN) 10 MG tablet Take 10 mg by mouth daily.    [provider]  meclizine (ANTIVERT) 25 MG tablet Take 25 mg by mouth 3 (three) times daily as needed for dizziness (vertigo).     [provider]  Melatonin 3 MG TABS Take 6 mg by mouth at bedtime.     [provider]  midodrine (PROAMATINE) 10 MG tablet Take 10 mg by mouth See admin instructions. Take 10 mg by mouth WITH MORNING MEDS AT FACILITY on Mon/Wed/Fri- dialysis days Take 10 mg by mouth at dialysis on Mon/Wed/Fri    [provider]   midodrine (PROAMATINE) 2.5 MG tablet Take 2.5 mg by mouth See admin instructions. 3 times daily on Sun Tues Thurs and Sat , and 2 times daily on Mon Wed and Fri after Dialysis    [provider]  OXYGEN Inhale 2 L into the lungs continuous.     [provider]  promethazine (PHENERGAN) 25 MG tablet Take 1 tablet (25 mg total) by mouth every 8 (eight) hours as needed for nausea or vomiting. 03/01/19   Pyrtle, Lajuan Lines, MD  saccharomyces boulardii (FLORASTOR) 250 MG capsule Take 250 mg by mouth daily.    [provider]  traMADol (ULTRAM) 50 MG tablet Take 50 mg by mouth every 6 (six) hours as needed (pain).     [provider]  traZODone (DESYREL) 50 MG tablet Take 25 mg by mouth at bedtime.     [provider]    Family History Family History  Problem Relation Age of Onset  . Diabetes Mother   . Heart disease Mother   . Non-Hodgkin's lymphoma Mother   . Heart attack Father   . Stroke Father   . Parkinson's disease Brother   . Lung disease Brother   . Heart disease Brother   . Multiple myeloma Sister     Social History Social History   Tobacco Use  . Smoking status: Never Smoker  . Smokeless tobacco: Never Used  Substance Use Topics  . Alcohol use: No  . Drug use: No     Allergies   Erythromycin; Codeine; Erythromycin base; Penicillin g; Penicillins; and Zantac [ranitidine hcl]   Review of Systems Review of Systems  Constitutional: Negative for chills.  HENT: Negative for sore throat.   Eyes: Negative for visual disturbance.  Respiratory: Positive for cough.   Cardiovascular: Negative for chest pain.  Gastrointestinal: Positive for abdominal pain (cramps), diarrhea and vomiting.  Genitourinary: Negative for hematuria.  Musculoskeletal: Negative for back pain.  Skin: Negative for rash.  Neurological: Negative for headaches.     Physical Exam Updated Vital Signs BP (!) 108/37 (BP Location: Left Leg)   Pulse 65   Temp 97.8  F (36.6  C) (Oral)   Resp 18   Wt 67 kg   SpO2 100%   BMI 24.96 kg/m   Physical Exam Vitals signs and nursing note reviewed.  Constitutional:      General: She is not in acute distress.    Appearance: She is well-developed.  HENT:     Head: Normocephalic and atraumatic.  Eyes:     Conjunctiva/sclera: Conjunctivae normal.  Neck:     Musculoskeletal: Neck supple.  Cardiovascular:     Rate and Rhythm: Normal rate and regular rhythm.     Heart sounds: Murmur present.  Pulmonary:     Effort: Pulmonary effort is normal. No respiratory distress.     Comments: Diminished breath sounds left side. Abdominal:     Palpations: Abdomen is soft.     Tenderness: There is no abdominal tenderness. There is no guarding.     Hernia: No hernia is present.  Musculoskeletal: Normal range of motion.        General: No deformity.  Skin:    General: Skin is warm and dry.     Capillary Refill: Capillary refill takes less than 2 seconds.  Neurological:     General: No focal deficit present.     Mental Status: She is alert. Mental status is at baseline.     Motor: Weakness present.     Comments: She has generalized weakness from deconditioning.      ED Treatments / Results  Labs (all labs ordered are listed, but only abnormal results are displayed) Labs Reviewed  MRSA PCR SCREENING - Abnormal; Notable for the following components:      Result Value   MRSA by PCR POSITIVE (*)    All other components within normal limits  COMPREHENSIVE METABOLIC PANEL - Abnormal; Notable for the following components:   Potassium 3.1 (*)    Chloride 96 (*)    Glucose, Bld 120 (*)    BUN 27 (*)    Creatinine, Ser 4.46 (*)    Calcium 8.0 (*)    Albumin 3.0 (*)    GFR calc non Af Amer 9 (*)    GFR calc Af Amer 10 (*)    Anion gap 16 (*)    All other components within normal limits  CBC WITH DIFFERENTIAL/PLATELET - Abnormal; Notable for the following components:   MCV 105.4 (*)    All other components  within normal limits  URINALYSIS, ROUTINE W REFLEX MICROSCOPIC - Abnormal; Notable for the following components:   Color, Urine AMBER (*)    APPearance TURBID (*)    Hgb urine dipstick SMALL (*)    Protein, ur 100 (*)    Nitrite POSITIVE (*)    Leukocytes,Ua LARGE (*)    WBC, UA >50 (*)    Bacteria, UA MANY (*)    All other components within normal limits  RENAL FUNCTION PANEL - Abnormal; Notable for the following components:   Potassium 2.9 (*)    Glucose, Bld 135 (*)    BUN 28 (*)    Creatinine, Ser 4.51 (*)    Calcium 7.7 (*)    Phosphorus 5.2 (*)    Albumin 2.8 (*)    GFR calc non Af Amer 9 (*)    GFR calc Af Amer 10 (*)    All other components within normal limits  CBC - Abnormal; Notable for the following components:   RBC 3.57 (*)    Hemoglobin 11.7 (*)    MCV 104.2 (*)    All  other components within normal limits  CULTURE, BLOOD (ROUTINE X 2)  CULTURE, BLOOD (ROUTINE X 2)  GASTROINTESTINAL PANEL BY PCR, STOOL (REPLACES STOOL CULTURE)  URINE CULTURE  LACTIC ACID, PLASMA  LIPASE, BLOOD    EKG EKG Interpretation  Date/Time:  Sunday March 13 2019 19:43:55 EDT Ventricular Rate:  73 PR Interval:    QRS Duration: 101 QT Interval:  437 QTC Calculation: 482 R Axis:   58 Text Interpretation:  Sinus rhythm Atrial premature complex Probable anteroseptal infarct, old sinus replacing junctional on prior 9/19 Confirmed by Aletta Edouard [phone]) on 03/13/2019 7:59:45 PM   Radiology Dg Chest Port 1 View  Result Date: 03/13/2019 CLINICAL DATA:  Nausea and vomiting with cough EXAM: PORTABLE CHEST 1 VIEW COMPARISON:  12/28/2018 FINDINGS: Cardiac shadow is within normal limits. The lungs are well aerated bilaterally. Stable effusion and consolidation in the left base is noted unchanged from the prior CT examination. No acute infiltrate is seen. No acute bony abnormality is noted. IMPRESSION: Chronic changes in the left base stable from January of 2019. No new focal abnormality  is seen. Electronically Signed   By: Inez Catalina M.D.   On: 03/13/2019 20:15    Procedures Procedures (including critical care time)  Medications Ordered in ED Medications - No data to display   Initial Impression / Assessment and Plan / ED Course  I have reviewed the triage vital signs and the nursing notes.  Pertinent labs & imaging results that were available during my care of the patient were reviewed by me and considered in my medical decision making (see chart for details).  Clinical Course as of Mar 13 1153  Sun Mar 12, 2425  3131 83 year old female dialysis dependent here with nausea vomiting diarrhea for couple of days.  There is some element of cough also.  She has a low-grade fever 99 2 here.  Sending off blood work chest x-ray and stool studies.   [MB]  2030 Differential includes AGE, partial obstruction, C. difficile, metabolic derangement, dehydration, ischemia   [MB]  2345 Patient does not feel any better since arrival.  I talked with the internal medicine team who will evaluate the patient for admission.   [MB]    Clinical Course User Index [MB] Hayden Rasmussen, MD        Final Clinical Impressions(s) / ED Diagnoses   Final diagnoses:  Nausea vomiting and diarrhea  Weakness generalized  ESRD (end stage renal disease) [facility])    ED Discharge Orders    None       Hayden Rasmussen, MD 03/14/19 1156

## 2019-03-14 ENCOUNTER — Encounter (HOSPITAL_COMMUNITY): Payer: Self-pay | Admitting: Internal Medicine

## 2019-03-14 DIAGNOSIS — N186 End stage renal disease: Secondary | ICD-10-CM

## 2019-03-14 DIAGNOSIS — R112 Nausea with vomiting, unspecified: Secondary | ICD-10-CM | POA: Diagnosis not present

## 2019-03-14 DIAGNOSIS — T82858A Stenosis of vascular prosthetic devices, implants and grafts, initial encounter: Secondary | ICD-10-CM | POA: Diagnosis not present

## 2019-03-14 DIAGNOSIS — I480 Paroxysmal atrial fibrillation: Secondary | ICD-10-CM | POA: Diagnosis not present

## 2019-03-14 DIAGNOSIS — M6281 Muscle weakness (generalized): Secondary | ICD-10-CM | POA: Diagnosis not present

## 2019-03-14 DIAGNOSIS — Z885 Allergy status to narcotic agent status: Secondary | ICD-10-CM

## 2019-03-14 DIAGNOSIS — I959 Hypotension, unspecified: Secondary | ICD-10-CM | POA: Diagnosis not present

## 2019-03-14 DIAGNOSIS — I132 Hypertensive heart and chronic kidney disease with heart failure and with stage 5 chronic kidney disease, or end stage renal disease: Secondary | ICD-10-CM | POA: Diagnosis not present

## 2019-03-14 DIAGNOSIS — I9589 Other hypotension: Secondary | ICD-10-CM

## 2019-03-14 DIAGNOSIS — Z880 Allergy status to penicillin: Secondary | ICD-10-CM | POA: Diagnosis not present

## 2019-03-14 DIAGNOSIS — E1122 Type 2 diabetes mellitus with diabetic chronic kidney disease: Secondary | ICD-10-CM | POA: Diagnosis not present

## 2019-03-14 DIAGNOSIS — R195 Other fecal abnormalities: Secondary | ICD-10-CM | POA: Diagnosis not present

## 2019-03-14 DIAGNOSIS — D631 Anemia in chronic kidney disease: Secondary | ICD-10-CM | POA: Diagnosis not present

## 2019-03-14 DIAGNOSIS — E876 Hypokalemia: Secondary | ICD-10-CM

## 2019-03-14 DIAGNOSIS — N390 Urinary tract infection, site not specified: Secondary | ICD-10-CM | POA: Diagnosis present

## 2019-03-14 DIAGNOSIS — F39 Unspecified mood [affective] disorder: Secondary | ICD-10-CM | POA: Diagnosis not present

## 2019-03-14 DIAGNOSIS — N3000 Acute cystitis without hematuria: Secondary | ICD-10-CM | POA: Diagnosis not present

## 2019-03-14 DIAGNOSIS — J90 Pleural effusion, not elsewhere classified: Secondary | ICD-10-CM

## 2019-03-14 DIAGNOSIS — R1312 Dysphagia, oropharyngeal phase: Secondary | ICD-10-CM | POA: Diagnosis not present

## 2019-03-14 DIAGNOSIS — Z992 Dependence on renal dialysis: Secondary | ICD-10-CM | POA: Diagnosis not present

## 2019-03-14 DIAGNOSIS — K529 Noninfective gastroenteritis and colitis, unspecified: Secondary | ICD-10-CM

## 2019-03-14 DIAGNOSIS — Y841 Kidney dialysis as the cause of abnormal reaction of the patient, or of later complication, without mention of misadventure at the time of the procedure: Secondary | ICD-10-CM | POA: Diagnosis not present

## 2019-03-14 DIAGNOSIS — T82898A Other specified complication of vascular prosthetic devices, implants and grafts, initial encounter: Secondary | ICD-10-CM | POA: Diagnosis not present

## 2019-03-14 DIAGNOSIS — R279 Unspecified lack of coordination: Secondary | ICD-10-CM | POA: Diagnosis not present

## 2019-03-14 DIAGNOSIS — J9611 Chronic respiratory failure with hypoxia: Secondary | ICD-10-CM | POA: Diagnosis not present

## 2019-03-14 DIAGNOSIS — Z743 Need for continuous supervision: Secondary | ICD-10-CM | POA: Diagnosis not present

## 2019-03-14 DIAGNOSIS — N2581 Secondary hyperparathyroidism of renal origin: Secondary | ICD-10-CM | POA: Diagnosis not present

## 2019-03-14 DIAGNOSIS — Z791 Long term (current) use of non-steroidal anti-inflammatories (NSAID): Secondary | ICD-10-CM | POA: Diagnosis not present

## 2019-03-14 DIAGNOSIS — Z79899 Other long term (current) drug therapy: Secondary | ICD-10-CM

## 2019-03-14 DIAGNOSIS — J9819 Other pulmonary collapse: Secondary | ICD-10-CM | POA: Diagnosis not present

## 2019-03-14 DIAGNOSIS — R198 Other specified symptoms and signs involving the digestive system and abdomen: Secondary | ICD-10-CM | POA: Diagnosis not present

## 2019-03-14 DIAGNOSIS — R293 Abnormal posture: Secondary | ICD-10-CM | POA: Diagnosis not present

## 2019-03-14 DIAGNOSIS — Z7982 Long term (current) use of aspirin: Secondary | ICD-10-CM | POA: Diagnosis not present

## 2019-03-14 DIAGNOSIS — D538 Other specified nutritional anemias: Secondary | ICD-10-CM | POA: Diagnosis not present

## 2019-03-14 DIAGNOSIS — R531 Weakness: Secondary | ICD-10-CM | POA: Diagnosis not present

## 2019-03-14 DIAGNOSIS — Z888 Allergy status to other drugs, medicaments and biological substances status: Secondary | ICD-10-CM

## 2019-03-14 DIAGNOSIS — I5032 Chronic diastolic (congestive) heart failure: Secondary | ICD-10-CM

## 2019-03-14 DIAGNOSIS — I951 Orthostatic hypotension: Secondary | ICD-10-CM | POA: Diagnosis not present

## 2019-03-14 DIAGNOSIS — Z881 Allergy status to other antibiotic agents status: Secondary | ICD-10-CM

## 2019-03-14 DIAGNOSIS — Z87440 Personal history of urinary (tract) infections: Secondary | ICD-10-CM

## 2019-03-14 DIAGNOSIS — Z8679 Personal history of other diseases of the circulatory system: Secondary | ICD-10-CM

## 2019-03-14 DIAGNOSIS — E211 Secondary hyperparathyroidism, not elsewhere classified: Secondary | ICD-10-CM | POA: Diagnosis not present

## 2019-03-14 DIAGNOSIS — R278 Other lack of coordination: Secondary | ICD-10-CM | POA: Diagnosis not present

## 2019-03-14 DIAGNOSIS — R300 Dysuria: Secondary | ICD-10-CM

## 2019-03-14 DIAGNOSIS — D509 Iron deficiency anemia, unspecified: Secondary | ICD-10-CM | POA: Diagnosis not present

## 2019-03-14 LAB — RENAL FUNCTION PANEL
Albumin: 2.8 g/dL — ABNORMAL LOW (ref 3.5–5.0)
Anion gap: 14 (ref 5–15)
BUN: 28 mg/dL — ABNORMAL HIGH (ref 8–23)
CHLORIDE: 98 mmol/L (ref 98–111)
CO2: 27 mmol/L (ref 22–32)
CREATININE: 4.51 mg/dL — AB (ref 0.44–1.00)
Calcium: 7.7 mg/dL — ABNORMAL LOW (ref 8.9–10.3)
GFR calc Af Amer: 10 mL/min — ABNORMAL LOW (ref >60)
GFR calc non Af Amer: 9 mL/min — ABNORMAL LOW (ref >60)
Glucose, Bld: 135 mg/dL — ABNORMAL HIGH (ref 70–99)
Phosphorus: 5.2 mg/dL — ABNORMAL HIGH (ref 2.5–4.6)
Potassium: 2.9 mmol/L — ABNORMAL LOW (ref 3.5–5.1)
Sodium: 139 mmol/L (ref 135–145)

## 2019-03-14 LAB — URINALYSIS, ROUTINE W REFLEX MICROSCOPIC
Bilirubin Urine: NEGATIVE
Glucose, UA: NEGATIVE mg/dL
KETONES UR: NEGATIVE mg/dL
Nitrite: POSITIVE — AB
Protein, ur: 100 mg/dL — AB
Specific Gravity, Urine: 1.014 (ref 1.005–1.030)
WBC, UA: >50 WBC/hpf — ABNORMAL HIGH (ref 0–5)
pH: 7 (ref 5.0–8.0)

## 2019-03-14 LAB — CBC
HEMATOCRIT: 37.2 % (ref 36.0–46.0)
Hemoglobin: 11.7 g/dL — ABNORMAL LOW (ref 12.0–15.0)
MCH: 32.8 pg (ref 26.0–34.0)
MCHC: 31.5 g/dL (ref 30.0–36.0)
MCV: 104.2 fL — ABNORMAL HIGH (ref 80.0–100.0)
Platelets: 170 10*3/uL (ref 150–400)
RBC: 3.57 MIL/uL — ABNORMAL LOW (ref 3.87–5.11)
RDW: 15.2 % (ref 11.5–15.5)
WBC: 5.3 10*3/uL (ref 4.0–10.5)
nRBC: 0 % (ref 0.0–0.2)

## 2019-03-14 LAB — MRSA PCR SCREENING: MRSA by PCR: POSITIVE — AB

## 2019-03-14 MED ORDER — MIDODRINE HCL 5 MG PO TABS: 10.0000 mg | ORAL_TABLET | ORAL | Status: DC

## 2019-03-14 MED ORDER — ALLOPURINOL 100 MG PO TABS
100.0000 mg | ORAL_TABLET | Freq: Every day | ORAL | Status: DC
  Administered 2019-03-14: 100 mg via ORAL
  Filled 2019-03-14: qty 1

## 2019-03-14 MED ORDER — MIDODRINE HCL 5 MG PO TABS
2.5000 mg | ORAL_TABLET | Freq: Once | ORAL | Status: AC
  Administered 2019-03-14: 2.5 mg via ORAL
  Filled 2019-03-14 (×2): qty 1

## 2019-03-14 MED ORDER — PROMETHAZINE HCL 25 MG PO TABS: 25.0000 mg | ORAL_TABLET | ORAL | Status: DC | PRN

## 2019-03-14 MED ORDER — MELATONIN 3 MG PO TABS
6.0000 mg | ORAL_TABLET | Freq: Every day | ORAL | Status: DC
  Administered 2019-03-14: 6 mg via ORAL
  Filled 2019-03-14 (×2): qty 2

## 2019-03-14 MED ORDER — MIDODRINE HCL 5 MG PO TABS
2.5000 mg | ORAL_TABLET | ORAL | Status: DC
  Administered 2019-03-14 (×2): 2.5 mg via ORAL
  Filled 2019-03-14 (×2): qty 1

## 2019-03-14 MED ORDER — ZINC OXIDE 12.8 % EX OINT
1.0000 "application " | TOPICAL_OINTMENT | Freq: Two times a day (BID) | CUTANEOUS | Status: DC
  Administered 2019-03-14 (×2): 1 via TOPICAL
  Filled 2019-03-14: qty 56.7

## 2019-03-14 MED ORDER — LOPERAMIDE HCL 2 MG PO CAPS
4.0000 mg | ORAL_CAPSULE | Freq: Three times a day (TID) | ORAL | Status: DC | PRN
  Administered 2019-03-14: 4 mg via ORAL
  Filled 2019-03-14: qty 2

## 2019-03-14 MED ORDER — HEPARIN SODIUM (PORCINE) 5000 UNIT/ML IJ SOLN
5000.0000 [IU] | Freq: Three times a day (TID) | INTRAMUSCULAR | Status: DC
  Administered 2019-03-14 (×2): 5000 [IU] via SUBCUTANEOUS
  Filled 2019-03-14 (×2): qty 1

## 2019-03-14 MED ORDER — LORATADINE 10 MG PO TABS
10.0000 mg | ORAL_TABLET | Freq: Every day | ORAL | Status: DC
  Administered 2019-03-14: 10 mg via ORAL
  Filled 2019-03-14: qty 1

## 2019-03-14 MED ORDER — CHLORHEXIDINE GLUCONATE CLOTH 2 % EX PADS: 6.0000 | MEDICATED_PAD | Freq: Every day | CUTANEOUS | Status: DC

## 2019-03-14 MED ORDER — MIDODRINE HCL 5 MG PO TABS
ORAL_TABLET | ORAL | Status: AC
  Filled 2019-03-14: qty 2

## 2019-03-14 MED ORDER — FOSFOMYCIN TROMETHAMINE 3 G PO PACK: 3.0000 g | PACK | Freq: Once | ORAL | Status: DC

## 2019-03-14 MED ORDER — SULFAMETHOXAZOLE-TRIMETHOPRIM 800-160 MG PO TABS: 1.0000 | ORAL_TABLET | Freq: Two times a day (BID) | ORAL | Status: DC

## 2019-03-14 MED ORDER — CALCIUM ACETATE (PHOS BINDER) 667 MG PO CAPS
1334.0000 mg | ORAL_CAPSULE | Freq: Three times a day (TID) | ORAL | Status: DC
  Administered 2019-03-14: 1334 mg via ORAL
  Filled 2019-03-14 (×2): qty 2

## 2019-03-14 MED ORDER — LIDOCAINE 5 % EX PTCH
1.0000 | MEDICATED_PATCH | Freq: Every day | CUTANEOUS | Status: DC
  Administered 2019-03-14: 1 via TRANSDERMAL
  Filled 2019-03-14: qty 1

## 2019-03-14 MED ORDER — DOXERCALCIFEROL 4 MCG/2ML IV SOLN
INTRAVENOUS | Status: AC
  Filled 2019-03-14: qty 2

## 2019-03-14 MED ORDER — FOSFOMYCIN TROMETHAMINE 3 G PO PACK
3.0000 g | PACK | Freq: Once | ORAL | Status: AC
  Administered 2019-03-14: 3 g via ORAL
  Filled 2019-03-14: qty 3

## 2019-03-14 MED ORDER — ASPIRIN 81 MG PO CHEW
81.0000 mg | CHEWABLE_TABLET | Freq: Every day | ORAL | Status: DC
  Administered 2019-03-14: 81 mg via ORAL
  Filled 2019-03-14: qty 1

## 2019-03-14 MED ORDER — DOXERCALCIFEROL 4 MCG/2ML IV SOLN
1.0000 ug | INTRAVENOUS | Status: DC
  Administered 2019-03-14: 1 ug via INTRAVENOUS
  Filled 2019-03-14 (×2): qty 2

## 2019-03-14 MED ORDER — ACETAMINOPHEN 650 MG RE SUPP: 650.0000 mg | Freq: Four times a day (QID) | RECTAL | Status: DC | PRN

## 2019-03-14 MED ORDER — SODIUM CHLORIDE 0.9% FLUSH
3.0000 mL | Freq: Two times a day (BID) | INTRAVENOUS | Status: DC
  Administered 2019-03-14 (×2): 3 mL via INTRAVENOUS

## 2019-03-14 MED ORDER — DICLOFENAC SODIUM 1 % TD GEL
4.0000 g | Freq: Four times a day (QID) | TRANSDERMAL | Status: DC
  Administered 2019-03-14 (×3): 4 g via TOPICAL
  Filled 2019-03-14: qty 100

## 2019-03-14 MED ORDER — RENA-VITE PO TABS
1.0000 | ORAL_TABLET | Freq: Every day | ORAL | Status: DC
  Administered 2019-03-14: 1 via ORAL
  Filled 2019-03-14: qty 1

## 2019-03-14 MED ORDER — MIDODRINE HCL 5 MG PO TABS: 2.5000 mg | ORAL_TABLET | ORAL | Status: DC

## 2019-03-14 MED ORDER — ACETAMINOPHEN 325 MG PO TABS: 650.0000 mg | ORAL_TABLET | Freq: Four times a day (QID) | ORAL | Status: DC | PRN

## 2019-03-14 MED ORDER — MECLIZINE HCL 25 MG PO TABS: 25.0000 mg | ORAL_TABLET | Freq: Three times a day (TID) | ORAL | Status: DC | PRN

## 2019-03-14 MED ORDER — BOOST / RESOURCE BREEZE PO LIQD CUSTOM
1.0000 | Freq: Three times a day (TID) | ORAL | Status: DC
  Administered 2019-03-14: 1 mL via ORAL
  Filled 2019-03-14 (×2): qty 1

## 2019-03-14 MED ORDER — MIDODRINE HCL 5 MG PO TABS
10.0000 mg | ORAL_TABLET | ORAL | Status: DC
  Administered 2019-03-14: 10 mg via ORAL

## 2019-03-14 MED ORDER — SODIUM CHLORIDE 0.9 % IV SOLN
INTRAVENOUS | Status: AC
  Administered 2019-03-14: 02:00:00 via INTRAVENOUS

## 2019-03-14 NOTE — Procedures (Signed)
I was present at this dialysis session. I have reviewed the session itself and made appropriate changes.   Vital signs in last 24 hours:  Temp:  [97.8 F (36.6 C)-99.2 F (37.3 C)] 97.8 F (36.6 C) (03/23 0859) Pulse Rate:  [65-81] 65 (03/23 0859) Resp:  [15-24] 18 (03/23 0859) BP: (96-125)/(34-72) 108/37 (03/23 0859) SpO2:  [99 %-100 %] 100 % (03/23 0859) Weight:  [67 kg] 67 kg (03/22 1940) Weight change:  Filed Weights   03/13/19 1940  Weight: 67 kg    Recent Labs  Lab 03/14/19 0251  NA 139  K 2.9*  CL 98  CO2 27  GLUCOSE 135*  BUN 28*  CREATININE 4.51*  CALCIUM 7.7*  PHOS 5.2*    Recent Labs  Lab 03/13/19 1958 03/14/19 0251  WBC 6.6 5.3  NEUTROABS 4.9  --   HGB 12.4 11.7*  HCT 41.2 37.2  MCV 105.4* 104.2*  PLT 179 170    Scheduled Meds: . allopurinol  100 mg Oral Daily  . aspirin  81 mg Oral Daily  . calcium acetate  1,334 mg Oral TID WC  . Chlorhexidine Gluconate Cloth  6 each Topical Q0600  . diclofenac sodium  4 g Topical QID  . doxercalciferol  1 mcg Intravenous Q M,W,F-HD  . fosfomycin  3 g Oral Once  . heparin  5,000 Units Subcutaneous Q8H  . lidocaine  1 patch Transdermal Daily  . loratadine  10 mg Oral Daily  . Melatonin  6 mg Oral QHS  . [START ON 03/16/2019] midodrine  10 mg Oral Once per day on Mon Wed Fri  . [START ON 03/15/2019] midodrine  2.5 mg Oral 3 times per day on Sun Tue Thu Sat  . midodrine  2.5 mg Oral 2 times per day on Mon Wed Fri  . multivitamin  1 tablet Oral Daily  . sodium chloride flush  3 mL Intravenous Q12H  . Zinc Oxide  1 application Topical BID   Continuous Infusions: PRN Meds:.acetaminophen **OR** acetaminophen, loperamide, meclizine, promethazine   Donetta Potts,  MD 03/14/2019, 1:09 PM

## 2019-03-14 NOTE — ED Notes (Signed)
ED TO INPATIENT HANDOFF REPORT  ED Nurse Name and Phone #: Kelby Fam [phone]  S Name/Age/Gender [name] James 83 y.o. female Room/Bed: 035C/035C  Code Status   Code Status: Full Code  Home/SNF/Other Skilled nursing facility Patient oriented to: self, place, time and situation Is this baseline? Yes   Triage Complete: Triage complete  Chief Complaint dialysis/sob  Triage Note Pt here from facility. N/V/D for 2 days with cough with green mucus. No fever. Hx of CHF, dialysis MWF. 2L nasal cannula from left lung collapsed in past. 119/68, HR 74, 100% 2 L nasal cannula, CBG 136, temp 98.8   Allergies Allergies  Allergen Reactions  . Erythromycin Swelling  . Codeine Palpitations, Rash and Other (See Comments)    GI Upset  . Erythromycin Base Other (See Comments)    GI Upset  . Penicillin G Rash    Has patient had a PCN reaction causing immediate rash, facial/tongue/throat swelling, SOB or lightheadedness with hypotension: Yes Has patient had a PCN reaction causing severe rash involving mucus membranes or skin necrosis: Unk Has patient had a PCN reaction that required hospitalization: Unk Has patient had a PCN reaction occurring within the last 10 years: Unk If all of the above answers are "NO", then may proceed with Cephalosporin use..  . Penicillins Rash and Other (See Comments)    Has taken Keflex & Rocephin many times without problems Has patient had a PCN reaction causing immediate rash, facial/tongue/throat swelling, SOB or lightheadedness with hypotension: Yes Has patient had a PCN reaction causing severe rash involving mucus membranes or skin necrosis: Unk Has patient had a PCN reaction that required hospitalization: Unk Has patient had a PCN reaction occurring within the last 10 years: Unk If all of the above answers are "NO", then may proceed with Cephalosporin use.   Keturah Shavers [Ranitidine Hcl] Nausea And Vomiting    Level of Care/Admitting Diagnosis ED  Disposition    ED Disposition Condition [facility] Area: [place] [[id number]]  Level of Care: Med-Surg [16]  Diagnosis: Intractable nausea and vomiting [537482]  Admitting Physician: Aldine Contes [[id number]]  Attending Physician: Aldine Contes [phone]  Estimated length of stay: past midnight tomorrow  Certification:: I certify this patient will need inpatient services for at least 2 midnights  PT Class (Do Not Modify): Inpatient [101]  PT Acc Code (Do Not Modify): Private [1]       B Medical/Surgery History Past Medical History:  Diagnosis Date  . Anemia, chronic disease   . Anxiety   . Arthritis    knees  . Atrial fibrillation ([place])   . Barrett esophagus   . Benign paroxysmal positional vertigo   . Breast cancer ([place]) 1980s   bil mastectomies, no radiation or chemo  . Cataract   . Chest pain, atypical 12/05/2008   R/Lmv- normal perfusion all regions, noe ECG changes   . CHF (congestive heart failure) ([place]) 05/13/2011   echo - OF>12%; stage 1 diastolic dysfunction; elevated LV filling pressure, MAC  . CKD (chronic kidney disease), stage III ([place])   . Claudication ([place]) 10/30/2005   doppler - normal evaluation, no evidence of aneurysm, diameter reduction, dissection, compression or vascular abnormality)  . Complication of anesthesia   . Dyslipidemia   . Dysrhythmia    Atrial Fibrillation  . Edema   . ESRD (end stage renal disease) on dialysis ([place])    "[facility]; [address]; MWF" (07/06/2018)  . Family history of adverse reaction to  anesthesia    son also has nausea   . Fever blister 12/2016  . Gastritis and gastroduodenitis   . GERD (gastroesophageal reflux disease)   . Gout   . Hemodialysis-associated hypotension   . HLD (hyperlipidemia)   . Hypertension   . IBS (irritable bowel syndrome)   . Insomnia   . Morbid obesity ([place])   . On home oxygen therapy    "2L; 24/7" (07/06/2018)  . PAF (paroxysmal atrial fibrillation) ([place])    . Peripheral neuropathy   . Pneumonia 12/2016  . PONV (postoperative nausea and vomiting)   . Renal cyst   . Renal insufficiency 10/30/2005   doppler - abn resistance consistent w/ parenchymal disease  . Renovascular hypertension   . Type 2 diabetes mellitus with diabetic neuropathy, with long-term current use of insulin ([place])   . Vertigo    Past Surgical History:  Procedure Laterality Date  . A/V FISTULAGRAM Left 03/19/2018   Procedure: A/V FISTULAGRAM;  Surgeon: Angelia Mould, MD;  Location: [facility];  Service: Cardiovascular;  Laterality: Left;  . A/V FISTULAGRAM N/A 12/24/2018   Procedure: A/V FISTULAGRAM - Right Arm;  Surgeon: Angelia Mould, MD;  Location: [facility];  Service: Cardiovascular;  Laterality: N/A;  . A/V SHUNTOGRAM Left 01/19/2018   Procedure: A/V SHUNTOGRAM;  Surgeon: Serafina Mitchell, MD;  Location: [facility];  Service: Cardiovascular;  Laterality: Left;  . ANGIOPLASTY Left 02/12/2015   Procedure: ANGIOPLASTY;  Surgeon: Angelia Mould, MD;  Location: [facility] CATH LAB;  Service: Cardiovascular;  Laterality: Left;  AVF  . AV FISTULA PLACEMENT Left 11/30/2014   Procedure: ARTERIOVENOUS (AV) FISTULA CREATION LEFT ARM;  Surgeon: Angelia Mould, MD;  Location: [facility];  Service: Vascular;  Laterality: Left;  . AV FISTULA PLACEMENT Left 03/20/2015   Procedure:  Inserton of Left Upper Arm Gortex Graft;  Surgeon: Angelia Mould, MD;  Location: [facility];  Service: Vascular;  Laterality: Left;  . AV FISTULA PLACEMENT Left 04/12/2015   Procedure: INSERTION OF LEFT ARM  ARTERIOVENOUS GORE-TEX GRAFT ;  Surgeon: Angelia Mould, MD;  Location: [facility];  Service: Vascular;  Laterality: Left;  . AV FISTULA PLACEMENT Right 06/08/2018   Procedure: INSERTION OF ARTERIOVENOUS (AV) GORE-TEX GRAFT ARM USING 4-7MM X 45CM GORETEX GRAFT;  Surgeon: Angelia Mould, MD;  Location: [facility];  Service: Vascular;  Laterality: Right;  . BACK  SURGERY  ~1980  . CATARACT EXTRACTION Right   . CHOLECYSTECTOMY    . ESOPHAGOGASTRODUODENOSCOPY N/A 10/24/2014   Procedure: ESOPHAGOGASTRODUODENOSCOPY (EGD);  Surgeon: Jerene Bears, MD;  Location: [facility] ENDOSCOPY;  Service: Endoscopy;  Laterality: N/A;  . EXCHANGE OF A DIALYSIS CATHETER Left 11/30/2014   Procedure: EXCHANGE OF A DIALYSIS CATHETER, LEFT INTERNAL JUGULAR;  Surgeon: Angelia Mould, MD;  Location: [facility];  Service: Vascular;  Laterality: Left;  . EYE SURGERY Left    transplant  . FISTULOGRAM N/A 02/12/2015   Procedure: FISTULOGRAM;  Surgeon: Angelia Mould, MD;  Location: [facility] CATH LAB;  Service: Cardiovascular;  Laterality: N/A;  . HEMORRHOID SURGERY    . IR AV DIALY SHUNT INTRO NEEDLE/INTRACATH INITIAL W/PTA/IMG RIGHT Right 08/25/2018  . IR AV DIALY SHUNT INTRO NEEDLE/INTRACATH INITIAL W/PTA/IMG RIGHT Right 11/17/2018  . IR US GUIDE VASC ACCESS RIGHT  08/25/2018  . LIGATION ARTERIOVENOUS GORTEX GRAFT Left 08/27/2018   Procedure: LIGATION ARTERIOVENOUS GORTEX GRAFT ARM;  Surgeon: Angelia Mould, MD;  Location: [place] [facility];  Service: Vascular;  Laterality: Left;  . LIGATION OF ARTERIOVENOUS  FISTULA Left 03/20/2015   Procedure: LIGATION OF ARTERIOVENOUS  FISTULA;  Surgeon: Angelia Mould, MD;  Location: [facility];  Service: Vascular;  Laterality: Left;  Marland Kitchen MASTECTOMY Bilateral ~1982   bil breast reconstruction with implants  . ORIF TIBIA & FIBULA FRACTURES Left 2007   also had left non displaced malleolar fracture.   Marland Kitchen PERIPHERAL VASCULAR BALLOON ANGIOPLASTY Left 01/19/2018   Procedure: PERIPHERAL VASCULAR BALLOON ANGIOPLASTY;  Surgeon: Serafina Mitchell, MD;  Location: [facility];  Service: Cardiovascular;  Laterality: Left;  fistula  . PERIPHERAL VASCULAR BALLOON ANGIOPLASTY Left 03/19/2018   Procedure: PERIPHERAL VASCULAR BALLOON ANGIOPLASTY;  Surgeon: Angelia Mould, MD;  Location: [facility];  Service: Cardiovascular;  Laterality: Left;  UPPER ARM  FISTULA  . PERIPHERAL VASCULAR BALLOON ANGIOPLASTY Right 12/24/2018   Procedure: PERIPHERAL VASCULAR BALLOON ANGIOPLASTY;  Surgeon: Angelia Mould, MD;  Location: [facility];  Service: Cardiovascular;  Laterality: Right;  arm fistula  . REMOVAL OF GRAFT Left 04/12/2015   Procedure: REMOVAL OF LEFT ARM ARTERIOVENOUS GORE-TEX GRAFT;  Surgeon: Angelia Mould, MD;  Location: [facility];  Service: Vascular;  Laterality: Left;     A IV Location/Drains/Wounds Patient Lines/Drains/Airways Status   Active Line/Drains/Airways    Name:   Placement date:   Placement time:   Site:   Days:   Peripheral IV 03/13/19 Left Antecubital   03/13/19    1959    Antecubital   1   Fistula / Graft Right Upper arm Arteriovenous fistula   06/08/18    -    Upper arm   279   Fistula / Graft Left Forearm Arteriovenous fistula   03/19/18    -    Forearm   360   Incision (Closed) 08/27/18 Arm Left   08/27/18    1229     199   Incision (Closed) 11/17/18 Arm Right   11/17/18    1315     117          Intake/Output Last 24 hours No intake or output data in the 24 hours ending 03/14/19 0056  Labs/Imaging Results for orders placed or performed during the hospital encounter of 03/13/19 (from the past 48 hour(s))  Lactic acid, plasma     Status: None   Collection Time: 03/13/19  7:58 PM  Result Value Ref Range   Lactic Acid, Venous 1.2 0.5 - 1.9 mmol/L    Comment: Performed at [facility], [address] [place], [place] [address]  Comprehensive metabolic panel     Status: Abnormal   Collection Time: 03/13/19  7:58 PM  Result Value Ref Range   Sodium 139 135 - 145 mmol/L   Potassium 3.1 (L) 3.5 - 5.1 mmol/L   Chloride 96 (L) 98 - 111 mmol/L   CO2 27 22 - 32 mmol/L   Glucose, Bld 120 (H) 70 - 99 mg/dL   BUN 27 (H) 8 - 23 mg/dL   Creatinine, Ser 4.46 (H) 0.44 - 1.00 mg/dL   Calcium 8.0 (L) 8.9 - 10.3 mg/dL   Total Protein 7.2 6.5 - 8.1 g/dL   Albumin 3.0 (L) 3.5 - 5.0 g/dL   AST 38 15 - 41  U/L   ALT 27 0 - 44 U/L   Alkaline Phosphatase 87 38 - 126 U/L   Total Bilirubin 0.3 0.3 - 1.2 mg/dL   GFR calc non Af Amer 9 (L) >60 mL/min  GFR calc Af Amer 10 (L) >60 mL/min   Anion gap 16 (H) 5 - 15    Comment: Performed at [facility] [address] [place], [place] [address]  Lipase, blood     Status: None   Collection Time: 03/13/19  7:58 PM  Result Value Ref Range   Lipase 24 11 - 51 U/L    Comment: Performed at [facility] [address], [place], [place] [address]  Culture, blood (routine x 2)     Status: None (Preliminary result)   Collection Time: 03/13/19  7:58 PM  Result Value Ref Range   Specimen Description BLOOD SITE NOT SPECIFIED    Special Requests      BOTTLES DRAWN AEROBIC AND ANAEROBIC Blood Culture adequate volume Performed at [facility], [place] [address] [place], [place] [address]    Culture PENDING    Report Status PENDING   CBC with Differential     Status: Abnormal   Collection Time: 03/13/19  7:58 PM  Result Value Ref Range   WBC 6.6 4.0 - 10.5 K/uL   RBC 3.91 3.87 - 5.11 MIL/uL   Hemoglobin 12.4 12.0 - 15.0 g/dL   HCT 41.2 36.0 - 46.0 %   MCV 105.4 (H) 80.0 - 100.0 fL   MCH 31.7 26.0 - 34.0 pg   MCHC 30.1 30.0 - 36.0 g/dL   RDW 15.4 11.5 - 15.5 %   Platelets 179 150 - 400 K/uL   nRBC 0.0 0.0 - 0.2 %   Neutrophils Relative % 74 %   Neutro Abs 4.9 1.7 - 7.7 K/uL   Lymphocytes Relative 15 %   Lymphs Abs 1.0 0.7 - 4.0 K/uL   Monocytes Relative 8 %   Monocytes Absolute 0.5 0.1 - 1.0 K/uL   Eosinophils Relative 1 %   Eosinophils Absolute 0.1 0.0 - 0.5 K/uL   Basophils Relative 1 %   Basophils Absolute 0.0 0.0 - 0.1 K/uL   Immature Granulocytes 1 %   Abs Immature Granulocytes 0.03 0.00 - 0.07 K/uL    Comment: Performed at [facility], [place] [address], [place], [place] [address]   Dg Chest Port 1 View  Result Date: 03/13/2019 CLINICAL DATA:  Nausea and vomiting with cough EXAM: PORTABLE CHEST 1 VIEW COMPARISON:   12/28/2018 FINDINGS: Cardiac shadow is within normal limits. The lungs are well aerated bilaterally. Stable effusion and consolidation in the left base is noted unchanged from the prior CT examination. No acute infiltrate is seen. No acute bony abnormality is noted. IMPRESSION: Chronic changes in the left base stable from January of 2019. No new focal abnormality is seen. Electronically Signed   By: Inez Catalina M.D.   On: 03/13/2019 20:15    Pending Labs Unresulted Labs (From admission, onward)    Start     Ordered   03/14/19 0500  Renal function panel  Tomorrow morning,   R     03/14/19 0015   03/14/19 0500  CBC  Tomorrow morning,   R     03/14/19 0015   03/14/19 0001  CBC  (heparin)  Once,   R    Comments:  Baseline for heparin therapy IF NOT ALREADY DRAWN.  Notify MD if PLT < 100 K.    03/14/19 0014   03/14/19 0001  Creatinine, serum  (heparin)  Once,   R    Comments:  Baseline for heparin therapy IF NOT ALREADY DRAWN.    03/14/19  0014   03/13/19 2316  Urinalysis, Routine w reflex microscopic  ONCE - STAT,   STAT     03/13/19 2315   03/13/19 2316  Urine culture  ONCE - STAT,   STAT    Question:  Patient immune status  Answer:  Normal   03/13/19 2315   03/13/19 1953  Gastrointestinal Panel by PCR , Stool  (Gastrointestinal Panel by PCR, Stool)  Once,   R     03/13/19 1953   03/13/19 1953  C Difficile Quick Screen w PCR reflex  (Gastrointestinal Panel by PCR, Stool)  Once, for 24 hours,   R     03/13/19 1953   03/13/19 1952  Culture, blood (routine x 2)  BLOOD CULTURE X 2,   STAT     03/13/19 1953          Vitals/Pain Today's Vitals   03/13/19 2215 03/13/19 2245 03/13/19 2300 03/13/19 2331  BP: (!) 100/42 (!) 102/59 (!) 113/34   Pulse: 76 74 76   Resp: 20 (!) 21 19   Temp:      TempSrc:      SpO2: 99% 100% 99%   Weight:      PainSc:    0-No pain    Isolation Precautions Enteric precautions (UV disinfection)  Medications Medications  allopurinol (ZYLOPRIM) tablet  100 mg (has no administration in time range)  aspirin chewable tablet 81 mg (has no administration in time range)  midodrine (PROAMATINE) tablet 10 mg (has no administration in time range)  midodrine (PROAMATINE) tablet 2.5 mg (has no administration in time range)  midodrine (PROAMATINE) tablet 2.5 mg (has no administration in time range)  loperamide (IMODIUM) capsule 4 mg (has no administration in time range)  calcium acetate (PHOSLO) capsule 1,334 mg (has no administration in time range)  meclizine (ANTIVERT) tablet 25 mg (has no administration in time range)  Melatonin TABS 6 mg (has no administration in time range)  Nephro Vitamins TABS 1 tablet (has no administration in time range)  loratadine (CLARITIN) tablet 10 mg (has no administration in time range)  promethazine (PHENERGAN) tablet 25 mg (has no administration in time range)  diclofenac sodium (VOLTAREN) 1 % transdermal gel 4 g (has no administration in time range)  Lidocaine 4 % PTCH 1 patch (has no administration in time range)  DIMETHICONE (TOPICAL) 5 % CREA 1 application (has no administration in time range)  heparin injection 5,000 Units (has no administration in time range)  sodium chloride flush (NS) 0.9 % injection 3 mL (has no administration in time range)  acetaminophen (TYLENOL) tablet 650 mg (has no administration in time range)    Or  acetaminophen (TYLENOL) suppository 650 mg (has no administration in time range)  0.9 %  sodium chloride infusion (has no administration in time range)  sodium chloride 0.9 % bolus 500 mL (500 mLs Intravenous New Bag/Given 03/13/19 2322)  ondansetron (ZOFRAN) injection 4 mg (4 mg Intravenous Given 03/13/19 2321)    Mobility non-ambulatory High fall risk   Focused Assessments Pulmonary Assessment Handoff:  Lung sounds:   O2 Device: Nasal Cannula O2 Flow Rate (L/min): 2 L/min      R Recommendations: See Admitting Provider Note  Report given to:   Additional Notes:

## 2019-03-14 NOTE — TOC Transition Note (Signed)
Transition of Care [facility]) - CM/SW Discharge Note   Patient Details  Name: NIASIA LANPHEAR MRN: [id number] Date of Birth: May 29, 1936  [facility] of [facility] CM/SW Contact:  Sable Feil, LCSW Phone Number: 03/14/2019, 5:43 PM   Clinical Narrative:  Patient medically stable for discharge and will return to [facility]. CSW talked with patient regarding discharge and her main concern is her persistent diarrhea.   Final next level of care: Skilled Nursing Facility([facility]) Barriers to Discharge: No Barriers Identified   Patient Goals and CMS Choice Patient states their goals for this hospitalization and ongoing recovery are:: Returiing to facility for continued medical care CMS Medicare.gov Compare Post Acute Care list provided to:: Other (Comment Required)(Not needed as patient from a skilled nursing facility) Choice offered to / list presented to : NA  Discharge Placement:  Ms. Fisk will be returning to [facility]              Patient [facility] at[facility](Patient from SNF) Patient to be transferred to facility by: Ambulance Name of family member notified: Justis Dupas - [phone] Patient and family notified of of transfer: 03/14/19  Discharge Plan and Services In-house Referral: NA Discharge Planning Services: NA Post Acute Care Choice: [place]          DME Arranged: N/A DME Agency: NA   HH Agency: NA   Social Determinants of Health ([place]) Interventions  None needed at this time   Readmission Risk Interventions No flowsheet data found.

## 2019-03-14 NOTE — NC FL2 (Signed)
[place] MEDICAID FL2 LEVEL OF CARE SCREENING TOOL     IDENTIFICATION  Patient Name: [name] James Birthdate: 1936/10/17 Sex: female Admission Date (Current Location): 03/13/2019  [place] and [place] Number:  Kathleen Argue [id number] [place] and Address:  The . [facility], [place] [address], [place], [place] [address] [address]      Provider Number: [id number]  Attending Physician Name and Address:  Aldine Contes, MD  Relative Name and Phone Number:  Coralyn Mark    Current Level of Care: Hospital Recommended Level of Care: Skilled Nursing Facility([facility]) Prior Approval Number:    Date Approved/Denied:   PASRR Number:    Discharge Plan: SNF    Current Diagnoses: Patient Active Problem List   Diagnosis Date Noted  . Nausea and vomiting 03/14/2019  . Chronic diarrhea 03/14/2019  . Dysuria 03/14/2019  . UTI (urinary tract infection) 03/14/2019  . Aortic atherosclerosis ([place]) 12/30/2018  . Hypoalbuminemia due to protein-calorie malnutrition ([place]) 12/30/2018  . Sick sinus syndrome ([place])   . Symptomatic bradycardia 08/24/2018  . Gout 08/23/2018  . Anxiety 08/23/2018  . Pneumothorax, left 10/13/2017  . Multiple lung nodules on CT 10/06/2017  . Pleural effusion, left 10/06/2017  . Diabetic polyneuropathy associated with type 2 diabetes mellitus ([place]) 07/30/2017  . Onychomycosis of toenail 07/30/2017  . Idiopathic chronic venous hypertension of both lower extremities with inflammation 07/30/2017  . Chronic respiratory failure with hypoxia ([place]) 04/29/2017  . Advance care planning   . Goals of care, counseling/discussion   . Palliative care by specialist   . Pressure ulcer 01/19/2017  . History of pressure ulcer 10/17/2016  . GAD (generalized anxiety disorder) 10/17/2016  . Osteopenia determined by x-ray 04/17/2016  . Type 2 diabetes mellitus with diabetic neuropathy ([place]) 06/08/2015  . Type 2 diabetes, controlled, with renal manifestation ([place])  06/08/2015  . PVD (peripheral vascular disease) ([place]) 06/08/2015  . Swelling of limb-Left arm 04/10/2015  . Nausea with vomiting 01/12/2015  . Normocytic anemia 01/12/2015  . Paroxysmal atrial fibrillation ([place]) 12/22/2014  . Melena   . ESRD on dialysis ([place]) 12/19/2014  . Chronic diastolic (congestive) heart failure ([place]) 12/19/2014  . Palpitation 12/19/2014  . Anemia, chronic disease 12/13/2014  . Hemodialysis-associated hypotension 12/13/2014  . Pulmonary edema 11/10/2014  . Physical deconditioning 10/29/2014  . Gastritis and gastroduodenitis 10/24/2014  . Heme positive stool 10/24/2014  . Morbid obesity ([place]) 10/18/2014  . Edema 10/18/2014  . Essential hypertension 10/17/2014  . DM type 2, uncontrolled, with renal complications ([place]) 48/54/6270  . Dyslipidemia 10/17/2014    Orientation RESPIRATION BLADDER Height & Weight     Self, Situation, Place  Normal Incontinent Weight: 147 lb 11.3 oz (67 kg) Height:     BEHAVIORAL SYMPTOMS/MOOD NEUROLOGICAL BOWEL NUTRITION STATUS      Continent Diet(Low-sodium Heart healthy)  AMBULATORY STATUS COMMUNICATION OF NEEDS Skin   Limited Assist Verbally Other (Comment)(MASD to groin and buttocks; barrier cream with dressing and cleansed with zinc oxide)                       Personal Care Assistance Level of Assistance  Bathing, Feeding, Dressing Bathing Assistance: Limited assistance Feeding assistance: Independent Dressing Assistance: Limited assistance     Functional Limitations Info  Sight, Hearing, Speech Sight Info: Adequate Hearing Info: Adequate Speech Info: Adequate    SPECIAL CARE FACTORS FREQUENCY                       Contractures  Contractures Info: Not present    Additional Factors Info  Code Status, Allergies Code Status Info: Full Allergies Info: Erythromycin, Codeine, Erythromycin, Base, Penicillin G, Penicillins, Zantac Ranitidine Hcl             Current Medications (03/14/2019):  This is  the current hospital active medication list Current Facility-Administered Medications  Medication Dose Route Frequency Provider Last Rate Last Dose  . acetaminophen (TYLENOL) tablet 650 mg  650 mg Oral Q6H PRN Neva Seat, MD       Or  . acetaminophen (TYLENOL) suppository 650 mg  650 mg Rectal Q6H PRN Neva Seat, MD      . allopurinol (ZYLOPRIM) tablet 100 mg  100 mg Oral Daily Neva Seat, MD   100 mg at 03/14/19 1003  . aspirin chewable tablet 81 mg  81 mg Oral Daily Neva Seat, MD   81 mg at 03/14/19 1003  . calcium acetate (PHOSLO) capsule 1,334 mg  1,334 mg Oral TID WC Neva Seat, MD      . Chlorhexidine Gluconate Cloth 2 % PADS 6 each  6 each Topical Q0600 Alric Seton, PA-C      . diclofenac sodium (VOLTAREN) 1 % transdermal gel 4 g  4 g Topical QID Neva Seat, MD   4 g at 03/14/19 1004  . doxercalciferol (HECTOROL) injection 1 mcg  1 mcg Intravenous Q M,W,F-HD Alric Seton, PA-C   1 mcg at 03/14/19 1433  . feeding supplement (BOOST / RESOURCE BREEZE) liquid 1 Container  1 Container Oral TID BM Aldine Contes, MD      . fosfomycin (MONUROL) packet 3 g  3 g Oral Once Neva Seat, MD      . heparin injection 5,000 Units  5,000 Units Subcutaneous Q8H Neva Seat, MD   5,000 Units at 03/14/19 [phone]  . lidocaine (LIDODERM) 5 % 1 patch  1 patch Transdermal Daily Neva Seat, MD   1 patch at 03/14/19 1004  . loperamide (IMODIUM) capsule 4 mg  4 mg Oral Q8H PRN Neva Seat, MD      . loratadine (CLARITIN) tablet 10 mg  10 mg Oral Daily Neva Seat, MD   10 mg at 03/14/19 1003  . meclizine (ANTIVERT) tablet 25 mg  25 mg Oral Q8H PRN Neva Seat, MD      . Melatonin TABS 6 mg  6 mg Oral Fritzi Mandes, MD   6 mg at 03/14/19 0230  . midodrine (PROAMATINE) 5 MG tablet           . [START ON 03/16/2019] midodrine (PROAMATINE) tablet 10 mg  10 mg Oral Once per day on Mon Wed Fri [place], Carley D, DO   10 mg at  03/14/19 1615  . [START ON 03/15/2019] midodrine (PROAMATINE) tablet 2.5 mg  2.5 mg Oral 3 times per day on Sun Tue Thu Sat Neva Seat, MD      . midodrine (PROAMATINE) tablet 2.5 mg  2.5 mg Oral 2 times per day on Mon Wed Fri [place], Carley D, DO   2.5 mg at 03/14/19 1043  . multivitamin (RENA-VIT) tablet 1 tablet  1 tablet Oral Daily Neva Seat, MD   1 tablet at 03/14/19 1003  . promethazine (PHENERGAN) tablet 25 mg  25 mg Oral Q4H PRN Neva Seat, MD      . sodium chloride flush (NS) 0.9 % injection 3 mL  3 mL Intravenous Q12H Neva Seat, MD   3 mL at 03/14/19 1005  . Zinc Oxide (TRIPLE PASTE)  12.8 % ointment 1 application  1 application Topical BID Neva Seat, MD   1 application at 95/58/31 1005     Discharge Medications: Please see discharge summary for a list of discharge medications.  Relevant Imaging Results:  Relevant Lab Results:   Additional Information ss#[id number]. Dialysis patient MWF at [facility]. Patient has left foot drop.  Sable Feil, LCSW

## 2019-03-14 NOTE — Discharge Summary (Signed)
Name: [name] James MRN: [id number] DOB: September 06, 1936 83 y.o. PCP: Patrick Jupiter  Date of Admission: 03/13/2019  7:33 PM Date of Discharge: 03/14/2019 Attending Physician: Aldine Contes, MD  Discharge Diagnosis: 1. Acute cystitis 2. Chronic nausea, vomiting, diarrhea 3. Chronic hypotension on Midodrine   Discharge Medications: Allergies as of 03/14/2019      Reactions   Erythromycin Swelling   Codeine Palpitations, Rash, Other (See Comments)   GI Upset   Erythromycin Base Other (See Comments)   GI Upset   Penicillin G Rash   Has patient had a PCN reaction causing immediate rash, facial/tongue/throat swelling, SOB or lightheadedness with hypotension: Yes Has patient had a PCN reaction causing severe rash involving mucus membranes or skin necrosis: Unk Has patient had a PCN reaction that required hospitalization: Unk Has patient had a PCN reaction occurring within the last 10 years: Unk If all of the above answers are "NO", then may proceed with Cephalosporin use.Marland Kitchen   Penicillins Rash, Other (See Comments)   Has taken Keflex & Rocephin many times without problems Has patient had a PCN reaction causing immediate rash, facial/tongue/throat swelling, SOB or lightheadedness with hypotension: Yes Has patient had a PCN reaction causing severe rash involving mucus membranes or skin necrosis: Unk Has patient had a PCN reaction that required hospitalization: Unk Has patient had a PCN reaction occurring within the last 10 years: Unk If all of the above answers are "NO", then may proceed with Cephalosporin use.   Zantac [ranitidine Hcl] Nausea And Vomiting      Medication List    TAKE these medications   acetaminophen 325 MG tablet Commonly known as:  TYLENOL Take 650 mg by mouth every 6 (six) hours as needed for fever ("for vomiting/nausea," per [facility]).   acetaminophen 325 MG tablet Commonly known as:  TYLENOL Take 650 mg by mouth 2 (two) times daily.   allopurinol 100 MG tablet  Commonly known as:  ZYLOPRIM Take 100 mg by mouth daily.   Aspercreme Lidocaine 4 % Ptch Generic drug:  Lidocaine Place 1 patch onto the skin daily. Applied to right knee (on at 0800 & off at 2000)   aspirin 81 MG chewable tablet Chew 81 mg by mouth daily.   calcium acetate 667 MG capsule Commonly known as:  PHOSLO Take 1,334 mg by mouth 3 (three) times daily with meals.   diazepam 5 MG tablet Commonly known as:  Valium Take 1 tablet (5 mg total) by mouth at bedtime.   DIMETHICONE (TOPICAL) 5 % Crea Apply 1 application topically See admin instructions. Place a nickel-sized amount onto buttock(s) area 2 times a day for barrier protection   loperamide 2 MG tablet Commonly known as:  IMODIUM A-D Take 2 tablets (4 mg total) by mouth every 8 (eight) hours as needed for diarrhea or loose stools (for diarrhea).   loratadine 10 MG tablet Commonly known as:  CLARITIN Take 10 mg by mouth daily.   meclizine 25 MG tablet Commonly known as:  ANTIVERT Take 25 mg by mouth every 8 (eight) hours as needed for dizziness (vertigo).   Melatonin 3 MG Tabs Take 6 mg by mouth at bedtime.   midodrine 10 MG tablet Commonly known as:  PROAMATINE Take 10 mg by mouth See admin instructions. Take 10 mg by mouth WITH MORNING MEDS AT FACILITY on Mon/Wed/Fri- dialysis days Take 10 mg by mouth at dialysis on Mon/Wed/Fri   midodrine 2.5 MG tablet Commonly known as:  PROAMATINE Take 2.5 mg by mouth  See admin instructions. 3 times daily on Sun Tues Thurs and Sat , and 2 times daily on Mon Wed and Fri after Dialysis   Nephro Vitamins 0.8 MG Tabs Take 1 tablet by mouth daily.   OXYGEN Inhale 2 L into the lungs continuous.   promethazine 25 MG tablet Commonly known as:  PHENERGAN Take 1 tablet (25 mg total) by mouth every 8 (eight) hours as needed for nausea or vomiting.   Simbrinza 1-0.2 % Susp Generic drug:  Brinzolamide-Brimonidine Place 1 drop into both eyes 2 (two) times daily.   traZODone  50 MG tablet Commonly known as:  DESYREL Take 25 mg by mouth at bedtime.   Voltaren 1 % Gel Generic drug:  diclofenac sodium Apply 4 g topically 4 (four) times daily. Applied to right knee       Disposition and follow-up:   TeresaTeresa James was discharged from [facility] in Stable condition.  At the hospital follow up visit please address:  1.  UTI: Treated with 1x dose of Fosfomycin. Please ensure symptoms have resolved over the next few days.  Chronic nausea and diarrhea: patient was afebrile without leukocytosis. Was not having continued loose stools the morning following admission and therefore infectious etiology was not suspected. She was continued on supportive care with phenergan and imodium.   2.  Labs / imaging needed at time of follow-up: renal function panel   3.  Pending labs/ test needing follow-up: none   Follow-up Appointments:   Hospital Course by problem list: [name] James is an 83 year old femalepresents from her assisted living facility due to concern for hypotension along with worsening of her chronic nausea, vomiting, and diarrhea and new onset dysuria.  1. Acute cystitis:  - U/A nitrite positive  - most likely etiology for worsening of her chronic GI symptoms and generalized weakness - received 1x dose Fosfomycin following HD session  2. Nausea, vomiting, diarrhea:  - chronic issue which is currently being worked up by GI outpatient - likely had acute worsening in the setting of above - given benign exam, no leukocytosis and afebrile, do not suspect infectious etiology  - she was clinically improved after IVF with parenteral anti-emetics - continued on home   3. Chronic hypotension: Patient presented due to concern for worsening hypotension which was secondary to acute worsening of her GI symptoms and subsequent dehydration. She responded well to bolus of IVF and was continued on home Midodrine.   4. ESRD on HD MWF: Nephrology was  consulted for inpatient HD session prior to return to assisted living facility. She was slightly hypokalemic on admission which was managed per nephrology during HD.   5. Chronic hypoxic respiratory failure 2/2 chronic left pleural effusion and collapsed left lung. She remained stable on chronic 2L nasal cannula.    Discharge Vitals:   BP (!) 115/54   Pulse 73   Temp 97.8 F (36.6 C) (Oral)   Resp 18   Wt 68.6 kg   SpO2 97%   BMI 25.56 kg/m   Pertinent Labs, Studies, and Procedures:  CMP Latest Ref Rng & Units 03/14/2019 03/13/2019 03/01/2019  Glucose 70 - 99 mg/dL 135(H) 120(H) -  BUN 8 - 23 mg/dL 28(H) 27(H) 24(H)  Creatinine 0.44 - 1.00 mg/dL 4.51(H) 4.46(H) 3.63(H)  Sodium 135 - 145 mmol/L 139 139 -  Potassium 3.5 - 5.1 mmol/L 2.9(L) 3.1(L) -  Chloride 98 - 111 mmol/L 98 96(L) -  CO2 22 - 32 mmol/L 27 27 -  Calcium 8.9 - 10.3 mg/dL 7.7(L) 8.0(L) -  Total Protein 6.5 - 8.1 g/dL - 7.2 -  Total Bilirubin 0.3 - 1.2 mg/dL - 0.3 -  Alkaline Phos 38 - 126 U/L - 87 -  AST 15 - 41 U/L - 38 -  ALT 0 - 44 U/L - 27 -     Discharge Instructions: Discharge Instructions    Diet - low sodium heart healthy   Complete by:  As directed    Discharge instructions   Complete by:  As directed    [name] James, it was a pleasure taking care of you. You were treated for a bladder infection which likely caused you to have worsening of your GI symptoms and weakness. You were treated with a dose of antibiotics here which adequately treated the infection. We will not make any other medication changes.   Take care! Dr. Koleen Distance   Increase activity slowly   Complete by:  As directed       Signed: Delice Bison, DO 03/14/2019, 2:32 PM   Pager: [phone]

## 2019-03-14 NOTE — Progress Notes (Signed)
New Admission Note:  Arrival Method: By bed from ED around 0230 Mental Orientation: Alert and oriented Telemetry: None Assessment: Completed Skin: Completed, refer to flowsheets IV: Left AC Pain: Denies Tubes: None Safety Measures: Safety Fall Prevention Plan was given, discussed  Admission: Completed [facility] Orientation: Patient has been orientated to the room, unit and the staff. Family: None  Orders have been reviewed and implemented. Will continue to monitor the patient. Call light has been placed within reach and bed alarm has been activated.   Perry Mount, RN  Phone Number: [phone]

## 2019-03-14 NOTE — Consult Note (Addendum)
[facility] Renal Consultation Note    Indication for Consultation:  Management of ESRD/hemodialysis; anemia, hypertension/volume and secondary hyperparathyroidism PCP:  HPI: [name] James is a 83 y.o. female with DM, HTN, Afib, gout, GERD, IBS, diastolic HF, barret's sesophagus, breast cancer who resides at [facility] [place] MWF at [facility]. She was admitted this am with worsening N and V with a long history of intermittent diarrhea which has been followed by GI in the outpatinet settings.  She has been unable to keep down her usual imodium and phenergan down.  She is compliant with dialysis with IDWG of usual < 0.5 kg and BP drop as low as 100s during treatment. Review of [facility] shows lowering of EDW 1.5 kg since mid February 2020.  Evaluation in ED showed NA 139 K 3.1 WBC 6.6 hgb 12.4 glu 120, lipase 24  LFT normal  UA turbid, + nitrite many bacterial > 50 WBC sent for culture, MRSA + PCR. CXR neg acute findings.  Currently on dialysis and having diarrhea- just had small liquid stool on dialysis.  Some lower abdominal pain.  No SOB, CP, fevers or chills.  Also has had dysuria sx.  Past Medical History:  Diagnosis Date  . Anemia, chronic disease   . Anxiety   . Arthritis    knees  . Atrial fibrillation ([place])   . Barrett esophagus   . Benign paroxysmal positional vertigo   . Breast cancer ([place]) 1980s   bil mastectomies, no radiation or chemo  . Cataract   . Chest pain, atypical 12/05/2008   R/Lmv- normal perfusion all regions, noe ECG changes   . CHF (congestive heart failure) ([place]) 05/13/2011   echo - TI>45%; stage 1 diastolic dysfunction; elevated LV filling pressure, MAC  . CKD (chronic kidney disease), stage III ([place])   . Claudication ([place]) 10/30/2005   doppler - normal evaluation, no evidence of aneurysm, diameter reduction, dissection, compression or vascular abnormality)  . Complication of anesthesia   . Controlled type 2 diabetes mellitus with  diabetic polyneuropathy, with long-term current use of insulin ([place]) 04/17/2016  . Dyslipidemia   . Dysrhythmia    Atrial Fibrillation  . Edema   . ESRD (end stage renal disease) on dialysis ([place])    "[facility]; [address]; MWF" (07/06/2018)  . Family history of adverse reaction to anesthesia    son also has nausea   . Fever blister 12/2016  . Gastritis and gastroduodenitis   . GERD (gastroesophageal reflux disease)   . Gout   . Hemodialysis-associated hypotension   . HLD (hyperlipidemia)   . Hypertension   . IBS (irritable bowel syndrome)   . Insomnia   . Iron deficiency anemia   . Junctional bradycardia 08/25/2018  . LFT elevation   . Long term current use of amiodarone 10/17/2016  . Morbid (severe) obesity due to excess calories ([place]) 04/29/2017  . Morbid obesity ([place])   . On home oxygen therapy    "2L; 24/7" (07/06/2018)  . PAF (paroxysmal atrial fibrillation) ([place])   . Peripheral neuropathy   . Pneumonia 12/2016  . PONV (postoperative nausea and vomiting)   . Renal cyst   . Renal insufficiency 10/30/2005   doppler - abn resistance consistent w/ parenchymal disease  . Renovascular hypertension   . Type 2 diabetes mellitus with diabetic neuropathy, with long-term current use of insulin ([place])   . Vertigo    Past Surgical History:  Procedure Laterality Date  . A/V FISTULAGRAM Left 03/19/2018  Procedure: A/V FISTULAGRAM;  Surgeon: Angelia Mould, MD;  Location: [place] [facility];  Service: Cardiovascular;  Laterality: Left;  . A/V FISTULAGRAM N/A 12/24/2018   Procedure: A/V FISTULAGRAM - Right Arm;  Surgeon: Angelia Mould, MD;  Location: [place] [facility];  Service: Cardiovascular;  Laterality: N/A;  . A/V SHUNTOGRAM Left 01/19/2018   Procedure: A/V SHUNTOGRAM;  Surgeon: Serafina Mitchell, MD;  Location: [place] [facility];  Service: Cardiovascular;  Laterality: Left;  . ANGIOPLASTY Left 02/12/2015   Procedure: ANGIOPLASTY;  Surgeon: Angelia Mould, MD;   Location: [facility] CATH LAB;  Service: Cardiovascular;  Laterality: Left;  AVF  . AV FISTULA PLACEMENT Left 11/30/2014   Procedure: ARTERIOVENOUS (AV) FISTULA CREATION LEFT ARM;  Surgeon: Angelia Mould, MD;  Location: [place];  Service: Vascular;  Laterality: Left;  . AV FISTULA PLACEMENT Left 03/20/2015   Procedure:  Inserton of Left Upper Arm Gortex Graft;  Surgeon: Angelia Mould, MD;  Location: [place];  Service: Vascular;  Laterality: Left;  . AV FISTULA PLACEMENT Left 04/12/2015   Procedure: INSERTION OF LEFT ARM  ARTERIOVENOUS GORE-TEX GRAFT ;  Surgeon: Angelia Mould, MD;  Location: [place];  Service: Vascular;  Laterality: Left;  . AV FISTULA PLACEMENT Right 06/08/2018   Procedure: INSERTION OF ARTERIOVENOUS (AV) GORE-TEX GRAFT ARM USING 4-7MM X 45CM GORETEX GRAFT;  Surgeon: Angelia Mould, MD;  Location: [place];  Service: Vascular;  Laterality: Right;  . BACK SURGERY  ~1980  . CATARACT EXTRACTION Right   . CHOLECYSTECTOMY    . ESOPHAGOGASTRODUODENOSCOPY N/A 10/24/2014   Procedure: ESOPHAGOGASTRODUODENOSCOPY (EGD);  Surgeon: Jerene Bears, MD;  Location[facility] ENDOSCOPY;  Service: Endoscopy;  Laterality: N/A;  . EXCHANGE OF A DIALYSIS CATHETER Left 11/30/2014   Procedure: EXCHANGE OF A DIALYSIS CATHETER, LEFT INTERNAL JUGULAR;  Surgeon: Angelia Mould, MD;  Location: [place];  Service: Vascular;  Laterality: Left;  . EYE SURGERY Left    transplant  . FISTULOGRAM N/A 02/12/2015   Procedure: FISTULOGRAM;  Surgeon: Angelia Mould, MD;  Location: [facility] CATH LAB;  Service: Cardiovascular;  Laterality: N/A;  . HEMORRHOID SURGERY    . IR AV DIALY SHUNT INTRO NEEDLE/INTRACATH INITIAL W/PTA/IMG RIGHT Right 08/25/2018  . IR AV DIALY SHUNT INTRO NEEDLE/INTRACATH INITIAL W/PTA/IMG RIGHT Right 11/17/2018  . IR US GUIDE VASC ACCESS RIGHT  08/25/2018  . LIGATION ARTERIOVENOUS GORTEX GRAFT Left 08/27/2018   Procedure: LIGATION ARTERIOVENOUS GORTEX GRAFT ARM;  Surgeon: Angelia Mould, MD;  Location: [place];  Service: Vascular;  Laterality: Left;  . LIGATION OF ARTERIOVENOUS  FISTULA Left 03/20/2015   Procedure: LIGATION OF ARTERIOVENOUS  FISTULA;  Surgeon: Angelia Mould, MD;  Location: [place];  Service: Vascular;  Laterality: Left;  Marland Kitchen MASTECTOMY Bilateral ~1982   bil breast reconstruction with implants  . ORIF TIBIA & FIBULA FRACTURES Left 2007   also had left non displaced malleolar fracture.   Marland Kitchen PERIPHERAL VASCULAR BALLOON ANGIOPLASTY Left 01/19/2018   Procedure: PERIPHERAL VASCULAR BALLOON ANGIOPLASTY;  Surgeon: Serafina Mitchell, MD;  Location: [place] [facility];  Service: Cardiovascular;  Laterality: Left;  fistula  . PERIPHERAL VASCULAR BALLOON ANGIOPLASTY Left 03/19/2018   Procedure: PERIPHERAL VASCULAR BALLOON ANGIOPLASTY;  Surgeon: Angelia Mould, MD;  Location: [place] [facility];  Service: Cardiovascular;  Laterality: Left;  UPPER ARM FISTULA  . PERIPHERAL VASCULAR BALLOON ANGIOPLASTY Right 12/24/2018   Procedure: PERIPHERAL VASCULAR BALLOON ANGIOPLASTY;  Surgeon: Angelia Mould, MD;  Location: [facility]  INVASIVE CV LAB;  Service: Cardiovascular;  Laterality: Right;  arm fistula  . REMOVAL OF GRAFT Left 04/12/2015   Procedure: REMOVAL OF LEFT ARM ARTERIOVENOUS GORE-TEX GRAFT;  Surgeon: Angelia Mould, MD;  Location: [facility] OR;  Service: Vascular;  Laterality: Left;   Family History  Problem Relation Age of Onset  . Diabetes Mother   . Heart disease Mother   . Non-Hodgkin's lymphoma Mother   . Heart attack Father   . Stroke Father   . Parkinson's disease Brother   . Lung disease Brother   . Heart disease Brother   . Multiple myeloma Sister    Social History:  reports that she has never smoked. She has never used smokeless tobacco. She reports that she does not drink alcohol or use drugs. Allergies  Allergen Reactions  . Erythromycin Swelling  . Codeine Palpitations, Rash and Other (See Comments)    GI Upset  . Erythromycin  Base Other (See Comments)    GI Upset  . Penicillin G Rash    Has patient had a PCN reaction causing immediate rash, facial/tongue/throat swelling, SOB or lightheadedness with hypotension: Yes Has patient had a PCN reaction causing severe rash involving mucus membranes or skin necrosis: Unk Has patient had a PCN reaction that required hospitalization: Unk Has patient had a PCN reaction occurring within the last 10 years: Unk If all of the above answers are "NO", then may proceed with Cephalosporin use..  . Penicillins Rash and Other (See Comments)    Has taken Keflex & Rocephin many times without problems Has patient had a PCN reaction causing immediate rash, facial/tongue/throat swelling, SOB or lightheadedness with hypotension: Yes Has patient had a PCN reaction causing severe rash involving mucus membranes or skin necrosis: Unk Has patient had a PCN reaction that required hospitalization: Unk Has patient had a PCN reaction occurring within the last 10 years: Unk If all of the above answers are "NO", then may proceed with Cephalosporin use.   Keturah Shavers [Ranitidine Hcl] Nausea And Vomiting   Prior to Admission medications   Medication Sig Start Date End Date Taking? Authorizing Provider  acetaminophen (TYLENOL) 325 MG tablet Take 650 mg by mouth every 6 (six) hours as needed for fever ("for vomiting/nausea," per [facility]).    Yes [provider]  acetaminophen (TYLENOL) 325 MG tablet Take 650 mg by mouth 2 (two) times daily.   Yes [provider]  allopurinol (ZYLOPRIM) 100 MG tablet Take 100 mg by mouth daily.   Yes [provider]  aspirin 81 MG chewable tablet Chew 81 mg by mouth daily.   Yes [provider]  B Complex-C-Folic Acid (NEPHRO VITAMINS) 0.8 MG TABS Take 1 tablet by mouth daily.   Yes [provider]  Brinzolamide-Brimonidine (SIMBRINZA) 1-0.2 % SUSP Place 1 drop into both eyes 2 (two) times daily.   Yes [provider]   calcium acetate (PHOSLO) 667 MG capsule Take 1,334 mg by mouth 3 (three) times daily with meals.   Yes [provider]  diazepam (VALIUM) 5 MG tablet Take 1 tablet (5 mg total) by mouth at bedtime. 07/08/18  Yes Arrien, Jimmy Picket, MD  diclofenac sodium (VOLTAREN) 1 % GEL Apply 4 g topically 4 (four) times daily. Applied to right knee   Yes [provider]  DIMETHICONE, TOPICAL, 5 % CREA Apply 1 application topically See admin instructions. Place a nickel-sized amount onto buttock(s) area 2 times a day for barrier protection   Yes [provider]  Lidocaine (ASPERCREME LIDOCAINE) 4 % PTCH Place 1 patch onto the skin daily. Applied to right knee (on at 0800 & off at 2000)   Yes [provider]  loperamide (IMODIUM A-D) 2 MG tablet Take 2 tablets (4 mg total) by mouth every 8 (eight) hours as needed for diarrhea or loose stools (for diarrhea). 03/01/19  Yes Pyrtle, Lajuan Lines, MD  loratadine (CLARITIN) 10 MG tablet Take 10 mg by mouth daily.   Yes [provider]  meclizine (ANTIVERT) 25 MG tablet Take 25 mg by mouth every 8 (eight) hours as needed for dizziness (vertigo).    Yes [provider]  Melatonin 3 MG TABS Take 6 mg by mouth at bedtime.    Yes [provider]  midodrine (PROAMATINE) 10 MG tablet Take 10 mg by mouth See admin instructions. Take 10 mg by mouth WITH MORNING MEDS AT FACILITY on Mon/Wed/Fri- dialysis days Take 10 mg by mouth at dialysis on Mon/Wed/Fri   Yes [provider]  midodrine (PROAMATINE) 2.5 MG tablet Take 2.5 mg by mouth See admin instructions. 3 times daily on Sun Tues Thurs and Sat , and 2 times daily on Mon Wed and Fri after Dialysis   Yes [provider]  OXYGEN Inhale 2 L into the lungs continuous.    Yes [provider]  promethazine (PHENERGAN) 25 MG tablet Take 1 tablet (25 mg total) by mouth every 8 (eight) hours as needed for nausea or vomiting. 03/01/19  Yes Pyrtle, Lajuan Lines, MD   traZODone (DESYREL) 50 MG tablet Take 25 mg by mouth at bedtime.    Yes [provider]   Current Facility-Administered Medications  Medication Dose Route Frequency Provider Last Rate Last Dose  . acetaminophen (TYLENOL) tablet 650 mg  650 mg Oral Q6H PRN Neva Seat, MD       Or  . acetaminophen (TYLENOL) suppository 650 mg  650 mg Rectal Q6H PRN Neva Seat, MD      . allopurinol (ZYLOPRIM) tablet 100 mg  100 mg Oral Daily Neva Seat, MD   100 mg at 03/14/19 1003  . aspirin chewable tablet 81 mg  81 mg Oral Daily Neva Seat, MD   81 mg at 03/14/19 1003  . calcium acetate (PHOSLO) capsule 1,334 mg  1,334 mg Oral TID WC Neva Seat, MD      . Chlorhexidine Gluconate Cloth 2 % PADS 6 each  6 each Topical Q0600 Alric Seton, PA-C      . diclofenac sodium (VOLTAREN) 1 % transdermal gel 4 g  4 g Topical QID Neva Seat, MD   4 g at 03/14/19 1004  . doxercalciferol (HECTOROL) injection 1 mcg  1 mcg Intravenous Q M,W,F-HD Alric Seton, PA-C      . fosfomycin (MONUROL) packet 3 g  3 g Oral Once Neva Seat, MD      . heparin injection 5,000 Units  5,000 Units Subcutaneous Q8H Neva Seat, MD   5,000 Units at 03/14/19 [phone]  . lidocaine (LIDODERM) 5 % 1 patch  1 patch Transdermal Daily Neva Seat, MD   1 patch at 03/14/19 1004  . loperamide (IMODIUM) capsule 4 mg  4 mg Oral Q8H PRN Neva Seat, MD      . loratadine (CLARITIN) tablet 10 mg  10 mg Oral Daily Neva Seat, MD   10 mg at 03/14/19 1003  . meclizine (ANTIVERT) tablet 25 mg  25 mg Oral Q8H PRN Neva Seat, MD      . Melatonin  TABS 6 mg  6 mg Oral QHS Neva Seat, MD   6 mg at 03/14/19 0230  . [START ON 03/16/2019] midodrine (PROAMATINE) tablet 10 mg  10 mg Oral Once per day on Mon Wed Fri [place], Carley D, DO      . [START ON 03/15/2019] midodrine (PROAMATINE) tablet 2.5 mg  2.5 mg Oral 3 times per day on Sun Tue Thu Sat Neva Seat, MD       . midodrine (PROAMATINE) tablet 2.5 mg  2.5 mg Oral 2 times per day on Mon Wed Fri [place], Carley D, DO   2.5 mg at 03/14/19 1043  . multivitamin (RENA-VIT) tablet 1 tablet  1 tablet Oral Daily Neva Seat, MD   1 tablet at 03/14/19 1003  . promethazine (PHENERGAN) tablet 25 mg  25 mg Oral Q4H PRN Neva Seat, MD      . sodium chloride flush (NS) 0.9 % injection 3 mL  3 mL Intravenous Q12H Neva Seat, MD   3 mL at 03/14/19 1005  . Zinc Oxide (TRIPLE PASTE) 12.8 % ointment 1 application  1 application Topical BID Neva Seat, MD   1 application at 38/25/05 1005   Labs: Basic Metabolic Panel: Recent Labs  Lab 03/13/19 1958 03/14/19 0251  NA 139 139  K 3.1* 2.9*  CL 96* 98  CO2 27 27  GLUCOSE 120* 135*  BUN 27* 28*  CREATININE 4.46* 4.51*  CALCIUM 8.0* 7.7*  PHOS  --  5.2*   Liver Function Tests: Recent Labs  Lab 03/13/19 1958 03/14/19 0251  AST 38  --   ALT 27  --   ALKPHOS 87  --   BILITOT 0.3  --   PROT 7.2  --   ALBUMIN 3.0* 2.8*   Recent Labs  Lab 03/13/19 1958  LIPASE 24   No results for input(s): AMMONIA in the last 168 hours. CBC: Recent Labs  Lab 03/13/19 1958 03/14/19 0251  WBC 6.6 5.3  NEUTROABS 4.9  --   HGB 12.4 11.7*  HCT 41.2 37.2  MCV 105.4* 104.2*  PLT 179 170   Studies/Results: Dg Chest Port 1 View  Result Date: 03/13/2019 CLINICAL DATA:  Nausea and vomiting with cough EXAM: PORTABLE CHEST 1 VIEW COMPARISON:  12/28/2018 FINDINGS: Cardiac shadow is within normal limits. The lungs are well aerated bilaterally. Stable effusion and consolidation in the left base is noted unchanged from the prior CT examination. No acute infiltrate is seen. No acute bony abnormality is noted. IMPRESSION: Chronic changes in the left base stable from January of 2019. No new focal abnormality is seen. Electronically Signed   By: Inez Catalina M.D.   On: 03/13/2019 20:15    ROS: As per HPI otherwise negative.  Physical Exam: Vitals:    03/13/19 2245 03/13/19 2300 03/14/19 0418 03/14/19 0859  BP: (!) 102/59 (!) 113/34 (!) 125/54 (!) 108/37  Pulse: 74 76 73 65  Resp: (!) _0 Temp:   98.2 F (36.8 C) 97.8 F (36.6 C)  TempSrc:   Oral Oral  SpO2: 100% 99% 100% 100%  Weight:         General: WDWN NAD but chronically ill appearing  Head: NCAT sclera not icteric MMM Neck: Supple.  Lungs: CTA bilaterally without wheezes, rales, or rhonchi. Breathing is unlabored. Heart: RRR with S1 S2.  Abdomen: soft NT + BS Lower extremities:without edema or ischemic changes, no open wounds  Neuro: A & O  X 3. Moves all extremities spontaneously.  Psych:  Responds to questions appropriately with a normal affect. Dialysis Access: AVGG right lower Qb 400  Dialysis Orders:   In Ctr HD, MonWedFri, 3 hrs 45 min, 180NRe Optiflux, BFR 400, DFR Manual 800 mL/min, EDW 67 (kg), Dialysate 2.0 K, 2.25 Ca, 1.0 Mg, 100 Dextrose (G2231), Sodium 137 (mEq/L), Bicarb Setting: 35 (mEq/L), UFR Profile: None, Sodium Model: Linear, Access: AVGraft-Synthetic - [employer], Clinic 2223-...  Doxercalciferol (Hectorol) 1 mcg IVP 3X Week,  Heparin Sodium (Porcine) 1,000 Units/mL Systemic Bolus 3300 units Iron Sucrose (Venofer) 50 mg IVP 1X Week Mircera 50 mcg IVP During Dialysis Every 4 weeks x 365 Days, last dose 3/4 - hgb stable in upper 11s  Assessment/Plan: 1.  Nausea, vomiting and diarrhea - supportive care - per primary CL 2.  ESRD   MWF - HD today per schedule- seen on dialysis - using bedpan  3. Hypokalemia-K low 2.9 using 4 K bath today - outpatient generally , 4 - needs a 3 K bath at d/c 4.  Hypertension/volume  - not an issue, midodrine for BP support - 5.  Anemia  - no ESA or Fe for now 6.  Metabolic bone disease -  Continue binders VDRA/binders when eating 7.  Nutrition - on CL - can liberalize to regular diet when taking pos - unintentional wt loss 1.5 kg this past month. 8.  Probable UTI - culture pending  Myriam Jacobson,  PA-C [place] [phone] 03/14/2019, 12:19 PM     I have seen and examined this patient and agree with plan and assessment in the above note with renal recommendations/intervention highlighted.  No BM today.  Still feels weak and is concerned about her diet here in the hospital not realizing that she was on a clear liquid diet to start.  Hopefully she can be discharged to SNF today after HD.  Broadus John A Yanette Tripoli,MD 03/14/2019 1:32 PM

## 2019-03-14 NOTE — Progress Notes (Signed)
Lucita Lora Muscato to be D/C'd Rehab per MD order.  Discussed prescriptions and follow up appointments with the patient. Prescriptions given to patient, medication list explained in detail. Pt verbalized understanding.  Allergies as of 03/14/2019      Reactions   Erythromycin Swelling   Codeine Palpitations, Rash, Other (See Comments)   GI Upset   Erythromycin Base Other (See Comments)   GI Upset   Penicillin G Rash   Has patient had a PCN reaction causing immediate rash, facial/tongue/throat swelling, SOB or lightheadedness with hypotension: Yes Has patient had a PCN reaction causing severe rash involving mucus membranes or skin necrosis: Unk Has patient had a PCN reaction that required hospitalization: Unk Has patient had a PCN reaction occurring within the last 10 years: Unk If all of the above answers are "NO", then may proceed with Cephalosporin use.Marland Kitchen   Penicillins Rash, Other (See Comments)   Has taken Keflex & Rocephin many times without problems Has patient had a PCN reaction causing immediate rash, facial/tongue/throat swelling, SOB or lightheadedness with hypotension: Yes Has patient had a PCN reaction causing severe rash involving mucus membranes or skin necrosis: Unk Has patient had a PCN reaction that required hospitalization: Unk Has patient had a PCN reaction occurring within the last 10 years: Unk If all of the above answers are "NO", then may proceed with Cephalosporin use.   Zantac [ranitidine Hcl] Nausea And Vomiting      Medication List    TAKE these medications   acetaminophen 325 MG tablet Commonly known as:  TYLENOL Take 650 mg by mouth every 6 (six) hours as needed for fever ("for vomiting/nausea," per [facility]).   acetaminophen 325 MG tablet Commonly known as:  TYLENOL Take 650 mg by mouth 2 (two) times daily.   allopurinol 100 MG tablet Commonly known as:  ZYLOPRIM Take 100 mg by mouth daily.   Aspercreme Lidocaine 4 % Ptch Generic drug:  Lidocaine Place 1  patch onto the skin daily. Applied to right knee (on at 0800 & off at 2000)   aspirin 81 MG chewable tablet Chew 81 mg by mouth daily.   calcium acetate 667 MG capsule Commonly known as:  PHOSLO Take 1,334 mg by mouth 3 (three) times daily with meals.   diazepam 5 MG tablet Commonly known as:  Valium Take 1 tablet (5 mg total) by mouth at bedtime.   DIMETHICONE (TOPICAL) 5 % Crea Apply 1 application topically See admin instructions. Place a nickel-sized amount onto buttock(s) area 2 times a day for barrier protection   loperamide 2 MG tablet Commonly known as:  IMODIUM A-D Take 2 tablets (4 mg total) by mouth every 8 (eight) hours as needed for diarrhea or loose stools (for diarrhea).   loratadine 10 MG tablet Commonly known as:  CLARITIN Take 10 mg by mouth daily.   meclizine 25 MG tablet Commonly known as:  ANTIVERT Take 25 mg by mouth every 8 (eight) hours as needed for dizziness (vertigo).   Melatonin 3 MG Tabs Take 6 mg by mouth at bedtime.   midodrine 10 MG tablet Commonly known as:  PROAMATINE Take 10 mg by mouth See admin instructions. Take 10 mg by mouth WITH MORNING MEDS AT FACILITY on Mon/Wed/Fri- dialysis days Take 10 mg by mouth at dialysis on Mon/Wed/Fri   midodrine 2.5 MG tablet Commonly known as:  PROAMATINE Take 2.5 mg by mouth See admin instructions. 3 times daily on Sun Tues Thurs and Sat , and 2 times daily on  Mon Wed and Fri after Dialysis   Nephro Vitamins 0.8 MG Tabs Take 1 tablet by mouth daily.   OXYGEN Inhale 2 L into the lungs continuous.   promethazine 25 MG tablet Commonly known as:  PHENERGAN Take 1 tablet (25 mg total) by mouth every 8 (eight) hours as needed for nausea or vomiting.   Simbrinza 1-0.2 % Susp Generic drug:  Brinzolamide-Brimonidine Place 1 drop into both eyes 2 (two) times daily.   traZODone 50 MG tablet Commonly known as:  DESYREL Take 25 mg by mouth at bedtime.   Voltaren 1 % Gel Generic drug:  diclofenac  sodium Apply 4 g topically 4 (four) times daily. Applied to right knee       Vitals:   03/14/19 1625 03/14/19 1800  BP: (!) 141/55 (!) 141/55  Pulse: 72   Resp: 18   Temp: 97.8 F (36.6 C)   SpO2: 100%     Skin clean, dry and intact without evidence of skin break down, no evidence of skin tears noted. IV catheter discontinued intact. Site without signs and symptoms of complications. Dressing and pressure applied. Pt denies pain at this time. No complaints noted.  An After Visit Summary was printed and given to the patient. Patient picked up by [facility] via stretcher, and D/C via ambulance transport.Marland Kitchen

## 2019-03-14 NOTE — Progress Notes (Signed)
Initial Nutrition Assessment  DOCUMENTATION CODES:   Not applicable  INTERVENTION:  Monitor for diet advancement Boost Breeze po TID, each supplement provides 250 kcal and 9 grams of protein MVI   NUTRITION DIAGNOSIS:   Inadequate oral intake related to nausea, vomiting, altered GI function as evidenced by energy intake < 75% for > 7 days, other (comment)(CL diet).   GOAL:   Patient will meet greater than or equal to 90% of their needs   MONITOR:   Diet advancement, Supplement acceptance, PO intake  REASON FOR ASSESSMENT:   Malnutrition Screening Tool    ASSESSMENT:  83 year old female with ESRD on HD, T2DM, HTN, chronic left pleural effusion and trapped left lung, CHF, a-fib, gastritis, chronic diarrhea/nausea, GERD, presented to ED with nausea/vomiting x 2 days s/p HD  HD: [facility] MWF - Dry wt 67.9kg (per pt)  Patient reports feeling some better today and reports improvement to nausea, with small loose stool just before visit. Patient reports that she has had chronic diarrhea/nasuea for years. RD asked if patient had utilized a food diary in attempt to identify foods that may be causing her GI issues, patient stated that she knew how to eat and tracking would not be helpful to her. Patient is a resident at a facility and reports varying PO due to what is being served that day. On non-HD days, she recalls veggie lo-main, roast beef, greens, and stated that she just gets tired of the same food. On dialysis days she recalls frosted flakes, and a bagged lunch from the facility that includes assorted sandwiches, cookies, and applesauce.   Patient reports that she really enjoys Nepro, but cannot have them because they give her diarrhea.   Medications reviewed and include: Phoslo, Hectorol (MWF), Monurol (MWF), Melatonin  Labs: K- 2.9 (L) replacing Phosphorous 5.2 (H) Glucose 135 (H)   NUTRITION - FOCUSED PHYSICAL EXAM:    Most Recent Value  Orbital Region  Mild  depletion  Upper Arm Region  Mild depletion  Thoracic and Lumbar Region  No depletion  Buccal Region  Mild depletion  Temple Region  Mild depletion  Clavicle Bone Region  No depletion  Clavicle and Acromion Bone Region  No depletion  Scapular Bone Region  No depletion  Dorsal Hand  Mild depletion  Patellar Region  Unable to assess [Pt had small BM in bed, RN aware and adressing]  Anterior Thigh Region  Unable to assess  Posterior Calf Region  Unable to assess  Edema (RD Assessment)  None  Hair  Reviewed  Eyes  Reviewed  Mouth  Reviewed  Skin  Reviewed  Nails  Reviewed       Diet Order:   Diet Order            Diet clear liquid Room service appropriate? Yes; Fluid consistency: Thin  Diet effective now        Diet - low sodium heart healthy              EDUCATION NEEDS:   No education needs have been identified at this time  Skin:  Skin Assessment: Reviewed RN Assessment  Last BM:  unknown  Height:   Ht Readings from Last 1 Encounters:  03/01/19 5' 4.5" (1.638 m)    Weight:   Wt Readings from Last 1 Encounters:  03/14/19 68.6 kg    Ideal Body Weight:  55 kg  BMI:  Body mass index is 25.56 kg/m.  Estimated Nutritional Needs:   Kcal:  0350-0938  Protein:  85-96 grams  Fluid:  >1.9L or per MD    Lajuan Lines, RD, LDN  After Hours/Weekend Pager: [phone]

## 2019-03-14 NOTE — TOC Progression Note (Signed)
Transition of Care [facility]) - Progression Note    Patient Details  Name: LEIAH GIANNOTTI MRN: [id number] Date of Birth: 12/24/1935  Transition of Care [facility]) CM/SW Contact  Sharlet Salina Mila Homer, LCSW Phone Number: 03/14/2019, 5:39 PM  Clinical Narrative:  Patient confirmed that she came from [facility] and plans to return to SNF at discharge. Ms. Lasota reported that she has been at facility for 5 years. CSW advised that her daughter lives out of town and her son lives in [place] and either of them can be contacted.   Expected Discharge Plan: [place] Barriers to Discharge: No Barriers Identified  Expected Discharge Plan and Services Expected Discharge Plan: [place] In-house Referral: NA Discharge Planning Services: NA Post Acute Care Choice: [place] Living arrangements for the past 2 months: Skilled Nursing Facility([facility]) Expected Discharge Date: 03/14/19               DME Arranged: N/A DME Agency: NA   HH Agency: NA   Social Determinants of Health (SDOH) Interventions No interventions needed at this time    Readmission Risk Interventions No flowsheet data found.

## 2019-03-14 NOTE — Progress Notes (Signed)
   Subjective: Ms. Chern reports that she feels better today, but she still feels exhausted. She states she has been feeling weak and tired at home. She takes midodrine at home for low blood pressures and follows closely with a cardiologist. She denies abdominal pain, diarrhea, and vomiting today. She works with a Automotive engineer because certain foods upset her stomach.   Objective:  Vital signs in last 24 hours: Vitals:   03/13/19 2215 03/13/19 2245 03/13/19 2300 03/14/19 0418  BP: (!) 100/42 (!) 102/59 (!) 113/34 (!) 125/54  Pulse: 76 74 76 73  Resp: 20 (!) 21 19 20   Temp:    98.2 F (36.8 C)  TempSrc:    Oral  SpO2: 99% 100% 99% 100%  Weight:       Gen: alert and oriented, no distress CV: Irregular, normal rate, no murmurs Pulm: CTAB, normal effort on home 2L Abd: bowel sounds present, soft, non-distended, non-tender Ext: no edema  Assessment/Plan:  Principal Problem:   Chronic diarrhea Active Problems:   Physical deconditioning   Chronic respiratory failure with hypoxia (HCC)   Nausea and vomiting   Dysuria  Ms. Yamaguchi is an 83 year old female presents from her assisted living facility due to concern for hypotension along with worsening of her chronic nausea, vomiting, and diarrhea and new onset dysuria. Patient has had chronic hypotension and is on daily Midodrine. Her blood pressures have been running around her baseline.    Nausea, vomiting, diarrhea: This is a chronic issue for the patient which she is currently being worked up with GI for. Per last visit on 3/10, no obvious etiology has been found. Plan was to proceed with CT abd/pelvis with contrast  Given that diarrhea has resolved and last course of antibiotics was 1-2 months ago, C.diff is not likely. She has not had any further loose stools since admission. Will continue treating supportively with her home Imodium and Phenergan. She is otherwise stable for discharge back to [facility] after dialysis.    Acute  cystitis  - U/A nitrite positive  - One time dose of fosfomycin 3g - likely explains her increased fatigue and weakness above baseline   End-Stage Renal Disease: HD MWF. Appreciate nephrology consult for inpatient dialysis session prior to returning to assisted living   Chronic Hypoxic Respiratory Failure: 2/2 Chronic Left Pleural Effusion and Collapsed Left Lung. - stable on home 2L O2 supplementation  Paroxysmal Atrial Fibrillation: Currently in NSR. Followed by Dr. Sallyanne Kuster. Not a candidate for nodal blocking agents because history of sick sinus syndrome and is not a good candidate for anticoagulation.   Chronic Hypotension: - Continue home Midodrine  Dispo: Anticipated discharge after dialysis session today.    Delice Bison, DO 03/14/2019, 6:39 AM Pager: [phone]

## 2019-03-14 NOTE — Care Management Obs Status (Signed)
[facility] NOTIFICATION   Patient Details  Name: CLARISSE RODRIGES MRN: [id number] Date of Birth: Jul 08, 1936   Medicare Observation Status Notification Given:  Yes    Bartholomew Crews, RN 03/14/2019, 5:19 PM

## 2019-03-15 ENCOUNTER — Other Ambulatory Visit: Payer: Medicare Other

## 2019-03-15 DIAGNOSIS — N186 End stage renal disease: Secondary | ICD-10-CM | POA: Diagnosis not present

## 2019-03-15 DIAGNOSIS — R198 Other specified symptoms and signs involving the digestive system and abdomen: Secondary | ICD-10-CM | POA: Diagnosis not present

## 2019-03-15 DIAGNOSIS — J90 Pleural effusion, not elsewhere classified: Secondary | ICD-10-CM | POA: Diagnosis not present

## 2019-03-15 DIAGNOSIS — N3000 Acute cystitis without hematuria: Secondary | ICD-10-CM | POA: Diagnosis not present

## 2019-03-16 DIAGNOSIS — D509 Iron deficiency anemia, unspecified: Secondary | ICD-10-CM | POA: Diagnosis not present

## 2019-03-16 DIAGNOSIS — N2581 Secondary hyperparathyroidism of renal origin: Secondary | ICD-10-CM | POA: Diagnosis not present

## 2019-03-16 DIAGNOSIS — D631 Anemia in chronic kidney disease: Secondary | ICD-10-CM | POA: Diagnosis not present

## 2019-03-16 DIAGNOSIS — N186 End stage renal disease: Secondary | ICD-10-CM | POA: Diagnosis not present

## 2019-03-16 DIAGNOSIS — E1122 Type 2 diabetes mellitus with diabetic chronic kidney disease: Secondary | ICD-10-CM | POA: Diagnosis not present

## 2019-03-16 DIAGNOSIS — E211 Secondary hyperparathyroidism, not elsewhere classified: Secondary | ICD-10-CM | POA: Diagnosis not present

## 2019-03-16 LAB — URINE CULTURE: Culture: >=100000 — AB

## 2019-03-18 DIAGNOSIS — E1122 Type 2 diabetes mellitus with diabetic chronic kidney disease: Secondary | ICD-10-CM | POA: Diagnosis not present

## 2019-03-18 DIAGNOSIS — D631 Anemia in chronic kidney disease: Secondary | ICD-10-CM | POA: Diagnosis not present

## 2019-03-18 DIAGNOSIS — D509 Iron deficiency anemia, unspecified: Secondary | ICD-10-CM | POA: Diagnosis not present

## 2019-03-18 DIAGNOSIS — N2581 Secondary hyperparathyroidism of renal origin: Secondary | ICD-10-CM | POA: Diagnosis not present

## 2019-03-18 DIAGNOSIS — E211 Secondary hyperparathyroidism, not elsewhere classified: Secondary | ICD-10-CM | POA: Diagnosis not present

## 2019-03-18 DIAGNOSIS — N186 End stage renal disease: Secondary | ICD-10-CM | POA: Diagnosis not present

## 2019-03-18 LAB — CULTURE, BLOOD (ROUTINE X 2)
CULTURE: NO GROWTH
Culture: NO GROWTH
Special Requests: ADEQUATE

## 2019-03-21 DIAGNOSIS — N186 End stage renal disease: Secondary | ICD-10-CM | POA: Diagnosis not present

## 2019-03-21 DIAGNOSIS — N2581 Secondary hyperparathyroidism of renal origin: Secondary | ICD-10-CM | POA: Diagnosis not present

## 2019-03-21 DIAGNOSIS — E211 Secondary hyperparathyroidism, not elsewhere classified: Secondary | ICD-10-CM | POA: Diagnosis not present

## 2019-03-21 DIAGNOSIS — D631 Anemia in chronic kidney disease: Secondary | ICD-10-CM | POA: Diagnosis not present

## 2019-03-21 DIAGNOSIS — D509 Iron deficiency anemia, unspecified: Secondary | ICD-10-CM | POA: Diagnosis not present

## 2019-03-21 DIAGNOSIS — E1122 Type 2 diabetes mellitus with diabetic chronic kidney disease: Secondary | ICD-10-CM | POA: Diagnosis not present

## 2019-03-22 DIAGNOSIS — I9589 Other hypotension: Secondary | ICD-10-CM | POA: Diagnosis not present

## 2019-03-22 DIAGNOSIS — J9611 Chronic respiratory failure with hypoxia: Secondary | ICD-10-CM | POA: Diagnosis not present

## 2019-03-22 DIAGNOSIS — R198 Other specified symptoms and signs involving the digestive system and abdomen: Secondary | ICD-10-CM | POA: Diagnosis not present

## 2019-03-23 DIAGNOSIS — Z992 Dependence on renal dialysis: Secondary | ICD-10-CM | POA: Diagnosis not present

## 2019-03-23 DIAGNOSIS — N2581 Secondary hyperparathyroidism of renal origin: Secondary | ICD-10-CM | POA: Diagnosis not present

## 2019-03-23 DIAGNOSIS — N186 End stage renal disease: Secondary | ICD-10-CM | POA: Diagnosis not present

## 2019-03-23 DIAGNOSIS — E1122 Type 2 diabetes mellitus with diabetic chronic kidney disease: Secondary | ICD-10-CM | POA: Diagnosis not present

## 2019-03-23 DIAGNOSIS — D631 Anemia in chronic kidney disease: Secondary | ICD-10-CM | POA: Diagnosis not present

## 2019-03-23 DIAGNOSIS — E211 Secondary hyperparathyroidism, not elsewhere classified: Secondary | ICD-10-CM | POA: Diagnosis not present

## 2019-03-23 DIAGNOSIS — D509 Iron deficiency anemia, unspecified: Secondary | ICD-10-CM | POA: Diagnosis not present

## 2019-03-25 ENCOUNTER — Other Ambulatory Visit: Payer: Self-pay | Admitting: *Deleted

## 2019-03-25 ENCOUNTER — Telehealth: Payer: Self-pay | Admitting: *Deleted

## 2019-03-25 ENCOUNTER — Encounter: Payer: Self-pay | Admitting: *Deleted

## 2019-03-25 DIAGNOSIS — E1122 Type 2 diabetes mellitus with diabetic chronic kidney disease: Secondary | ICD-10-CM | POA: Diagnosis not present

## 2019-03-25 DIAGNOSIS — D509 Iron deficiency anemia, unspecified: Secondary | ICD-10-CM | POA: Diagnosis not present

## 2019-03-25 DIAGNOSIS — E211 Secondary hyperparathyroidism, not elsewhere classified: Secondary | ICD-10-CM | POA: Diagnosis not present

## 2019-03-25 DIAGNOSIS — D631 Anemia in chronic kidney disease: Secondary | ICD-10-CM | POA: Diagnosis not present

## 2019-03-25 DIAGNOSIS — N2581 Secondary hyperparathyroidism of renal origin: Secondary | ICD-10-CM | POA: Diagnosis not present

## 2019-03-25 DIAGNOSIS — N186 End stage renal disease: Secondary | ICD-10-CM | POA: Diagnosis not present

## 2019-03-25 NOTE — Telephone Encounter (Signed)
I have called [name] regarding transporting this patient for PV procedure on Tuesday 4-7. Per Janett Billow, there are no COVID-19 cases or potential cases at [facility] at this time. [name] James has not shown any sxs at this time but they will contact me if she should have any S&S prior to Tuesday. I faxed orders to their [facility] Fax [phone]).

## 2019-03-25 NOTE — Telephone Encounter (Signed)
Received a faxed request from [name] Fairly, NP at [facility] for this patient to have a right arm fistulogram due to increased velocities in her access. The patient had previous successful fistulogram in January by Dr. Scot Dock. The patient resides at [facility] and I will call them and give preop instructions

## 2019-03-28 DIAGNOSIS — E211 Secondary hyperparathyroidism, not elsewhere classified: Secondary | ICD-10-CM | POA: Diagnosis not present

## 2019-03-28 DIAGNOSIS — N186 End stage renal disease: Secondary | ICD-10-CM | POA: Diagnosis not present

## 2019-03-28 DIAGNOSIS — D509 Iron deficiency anemia, unspecified: Secondary | ICD-10-CM | POA: Diagnosis not present

## 2019-03-28 DIAGNOSIS — N2581 Secondary hyperparathyroidism of renal origin: Secondary | ICD-10-CM | POA: Diagnosis not present

## 2019-03-28 DIAGNOSIS — E1122 Type 2 diabetes mellitus with diabetic chronic kidney disease: Secondary | ICD-10-CM | POA: Diagnosis not present

## 2019-03-28 DIAGNOSIS — D631 Anemia in chronic kidney disease: Secondary | ICD-10-CM | POA: Diagnosis not present

## 2019-03-29 ENCOUNTER — Ambulatory Visit (HOSPITAL_COMMUNITY)
Admission: RE | Admit: 2019-03-29 | Discharge: 2019-03-29 | Disposition: A | Discharge status: Home / Self Care | Payer: Medicare Other | Attending: Vascular Surgery | Admitting: Vascular Surgery

## 2019-03-29 ENCOUNTER — Encounter (HOSPITAL_COMMUNITY)
Admission: RE | Disposition: A | Discharge status: Home / Self Care | Payer: Self-pay | Source: Home / Self Care | Attending: Vascular Surgery

## 2019-03-29 ENCOUNTER — Encounter (HOSPITAL_COMMUNITY): Payer: Self-pay

## 2019-03-29 ENCOUNTER — Other Ambulatory Visit: Payer: Self-pay

## 2019-03-29 DIAGNOSIS — Z992 Dependence on renal dialysis: Secondary | ICD-10-CM | POA: Diagnosis not present

## 2019-03-29 DIAGNOSIS — T82858A Stenosis of vascular prosthetic devices, implants and grafts, initial encounter: Secondary | ICD-10-CM | POA: Diagnosis not present

## 2019-03-29 DIAGNOSIS — F39 Unspecified mood [affective] disorder: Secondary | ICD-10-CM | POA: Diagnosis not present

## 2019-03-29 DIAGNOSIS — D538 Other specified nutritional anemias: Secondary | ICD-10-CM | POA: Diagnosis not present

## 2019-03-29 DIAGNOSIS — T82898A Other specified complication of vascular prosthetic devices, implants and grafts, initial encounter: Secondary | ICD-10-CM | POA: Diagnosis not present

## 2019-03-29 DIAGNOSIS — I951 Orthostatic hypotension: Secondary | ICD-10-CM | POA: Diagnosis not present

## 2019-03-29 DIAGNOSIS — Y841 Kidney dialysis as the cause of abnormal reaction of the patient, or of later complication, without mention of misadventure at the time of the procedure: Secondary | ICD-10-CM | POA: Insufficient documentation

## 2019-03-29 DIAGNOSIS — I480 Paroxysmal atrial fibrillation: Secondary | ICD-10-CM | POA: Diagnosis not present

## 2019-03-29 DIAGNOSIS — N186 End stage renal disease: Secondary | ICD-10-CM | POA: Diagnosis not present

## 2019-03-29 DIAGNOSIS — E876 Hypokalemia: Secondary | ICD-10-CM | POA: Diagnosis not present

## 2019-03-29 DIAGNOSIS — R195 Other fecal abnormalities: Secondary | ICD-10-CM | POA: Diagnosis not present

## 2019-03-29 HISTORY — PX: A/V FISTULAGRAM: CATH118298

## 2019-03-29 HISTORY — PX: PERIPHERAL VASCULAR BALLOON ANGIOPLASTY: CATH118281

## 2019-03-29 LAB — POCT I-STAT 4, (NA,K, GLUC, HGB,HCT)
Glucose, Bld: 131 mg/dL — ABNORMAL HIGH (ref 70–99)
HCT: 38 % (ref 36.0–46.0)
Hemoglobin: 12.9 g/dL (ref 12.0–15.0)
Potassium: 3.9 mmol/L (ref 3.5–5.1)
Sodium: 139 mmol/L (ref 135–145)

## 2019-03-29 SURGERY — A/V FISTULAGRAM
Anesthesia: LOCAL | Laterality: Right

## 2019-03-29 MED ORDER — HEPARIN SODIUM (PORCINE) 1000 UNIT/ML IJ SOLN
INTRAMUSCULAR | Status: AC
  Filled 2019-03-29: qty 1

## 2019-03-29 MED ORDER — LIDOCAINE HCL (PF) 1 % IJ SOLN
INTRAMUSCULAR | Status: AC
  Filled 2019-03-29: qty 30

## 2019-03-29 MED ORDER — SODIUM CHLORIDE 0.9% FLUSH: 3.0000 mL | Freq: Two times a day (BID) | INTRAVENOUS | Status: DC

## 2019-03-29 MED ORDER — IODIXANOL 320 MG/ML IV SOLN
INTRAVENOUS | Status: DC | PRN
  Administered 2019-03-29: 37 mL via INTRAVENOUS

## 2019-03-29 MED ORDER — SODIUM CHLORIDE 0.9% FLUSH: 3.0000 mL | INTRAVENOUS | Status: DC | PRN

## 2019-03-29 MED ORDER — LIDOCAINE HCL (PF) 1 % IJ SOLN
INTRAMUSCULAR | Status: DC | PRN
  Administered 2019-03-29: 5 mL

## 2019-03-29 MED ORDER — SODIUM CHLORIDE 0.9 % IV SOLN: 250.0000 mL | INTRAVENOUS | Status: DC | PRN

## 2019-03-29 MED ORDER — HEPARIN SODIUM (PORCINE) 1000 UNIT/ML IJ SOLN
INTRAMUSCULAR | Status: DC | PRN
  Administered 2019-03-29: 4000 [IU] via INTRAVENOUS

## 2019-03-29 MED ORDER — HEPARIN (PORCINE) IN NACL 1000-0.9 UT/500ML-% IV SOLN
INTRAVENOUS | Status: DC | PRN
  Administered 2019-03-29: 500 mL

## 2019-03-29 MED ORDER — HEPARIN (PORCINE) IN NACL 1000-0.9 UT/500ML-% IV SOLN
INTRAVENOUS | Status: AC
  Filled 2019-03-29: qty 500

## 2019-03-29 SURGICAL SUPPLY — 16 items
BAG SNAP BAND KOVER 36X36 (MISCELLANEOUS) ×3 IMPLANT
BALLN MUSTANG 6X80X75 (BALLOONS) ×3
BALLN MUSTANG 7X60X75 (BALLOONS) ×3
BALLOON MUSTANG 6X80X75 (BALLOONS) IMPLANT
BALLOON MUSTANG 7X60X75 (BALLOONS) IMPLANT
COVER DOME SNAP 22 D (MISCELLANEOUS) ×3 IMPLANT
KIT ENCORE 26 ADVANTAGE (KITS) ×1 IMPLANT
KIT MICROPUNCTURE NIT STIFF (SHEATH) ×1 IMPLANT
PROTECTION STATION PRESSURIZED (MISCELLANEOUS) ×3
SHEATH PINNACLE R/O II 6F 4CM (SHEATH) ×1 IMPLANT
SHEATH PROBE COVER 6X72 (BAG) ×4 IMPLANT
STATION PROTECTION PRESSURIZED (MISCELLANEOUS) ×2 IMPLANT
STOPCOCK MORSE 400PSI 3WAY (MISCELLANEOUS) ×3 IMPLANT
TRAY PV CATH (CUSTOM PROCEDURE TRAY) ×3 IMPLANT
TUBING CIL FLEX 10 FLL-RA (TUBING) ×3 IMPLANT
WIRE HITORQ VERSACORE ST 145CM (WIRE) ×1 IMPLANT

## 2019-03-29 NOTE — H&P (Signed)
   Patient name: [name] James MRN: [id number] DOB: [birth year] Sex: female  REASON FOR VISIT:   For fistulogram.  HPI:   [name] James is a pleasant 83 y.o. female who has a left forearm AV graft.  This has apparently not been working well and she was set up for a fistulogram.  She resides at [facility].  Of note she underwent a fistulogram on 12/24/2018 which showed no central venous stenosis on the right.  There was a weblike stenosis in the high brachial vein with a moderate stenosis distal to that which was successfully ballooned with a 7 mm x 6 cm balloon.  There was a moderate 60% stenosis above the antecubital level which was also ballooned with a 7 mm x 6 mm balloon.  Current Facility-Administered Medications  Medication Dose Route Frequency Provider Last Rate Last Dose  . 0.9 %  sodium chloride infusion  250 mL Intravenous PRN Angelia Mould, MD      . sodium chloride flush (NS) 0.9 % injection 3 mL  3 mL Intravenous Q12H Angelia Mould, MD      . sodium chloride flush (NS) 0.9 % injection 3 mL  3 mL Intravenous PRN Angelia Mould, MD        REVIEW OF SYSTEMS:  [X]  denotes positive finding, [ ]  denotes negative finding Vascular    Leg swelling    Cardiac    Chest pain or chest pressure:    Shortness of breath upon exertion:    Short of breath when lying flat:    Irregular heart rhythm:    Constitutional    Fever or chills:     PHYSICAL EXAM:   Vitals:   03/29/19 0846  BP: (!) 80/60  Pulse: 89  SpO2: 100%  Height: 5\' 5"  (1.651 m)    GENERAL: The patient is a well-nourished female, in no acute distress. The vital signs are documented above. CARDIOVASCULAR: There is a regular rate and rhythm. PULMONARY: There is good air exchange bilaterally without wheezing or rales. She has a good thrill in her right forearm graft.  The arterial aspect of the graft appears to be along the lateral aspect of the forearm.  DATA:   Potassium is 3.9   MEDICAL ISSUES:   REPORTEDLY POORLY FUNCTIONING RIGHT FOREARM GRAFT:  We will proceed with a fistulogram to further evaluate her right forearm AV graft.  I have discussed the indications for the procedure and the potential complications and she is agreeable to proceed.  All of her questions were answered.  [name] [facility] [phone]

## 2019-03-29 NOTE — Discharge Instructions (Signed)

## 2019-03-29 NOTE — Op Note (Signed)
   PATIENT: [name]      MRN: [id number] DOB: 1936-08-21    DATE OF PROCEDURE: 03/29/2019  INDICATIONS:    [name] MICHELETTI is a 83 y.o. female with a poorly functioning left forearm graft.  She presents for fistulogram and possible venoplasty  PROCEDURE:    1.  Ultrasound-guided access to the right forearm AV graft 2.  Fistulogram right forearm AV graft 3.  Venoplasty right brachial vein (90m x 8 cm balloon and 7 x 6 balloon)  SURGEON: CJudeth Cornfield DScot Dock MD, FACS  ANESTHESIA: Local  EBL: Minimal  TECHNIQUE: The patient was taken to the peripheral vascular lab.  The right arm was prepped and draped in the usual sterile fashion.  After the skin was anesthetized with 1% lidocaine, under ultrasound guidance, the graft was cannulated on the medial aspect of the forearm along the venous limb of the graft.  A micropuncture sheath was introduced over the wire.  A fistulogram was then obtained.  This showed no central venous stenosis.  There was a stenosis in the brachial vein over a length of approximately 6 cm that was 95% plus narrowed.  I elected to address this with venoplasty.  A Bentson wire was advanced through the micropuncture sheath and the micropuncture sheath was exchanged for a short 6 FPakistansheath.  I initially selected a 6 mm x 8 cm balloon.  This was inflated to 20 atm across the stenosis for 2 minutes.  Reflux shot with the balloon inflated showed no problems at the arterial end of the graft to the arterial anastomosis.  Follow-up films showed some mild residual stenosis.  I went back with a 7 mm x 6 cm balloon which was inflated to 20 atm.  Completion film showed a good result with a less than 15% residual stenosis.  The cannulation site was closed after the wire and balloon were removed.  This was done with a 4-0 Monocryl.  No immediate complications were noted.  FINDINGS:   1.  There was a 95% stenosis in the brachial vein in the mid upper arm which was successfully  addressed with venoplasty. 2.  There was no central venous stenosis. 3.  There were no problems with the graft itself or the arterial limb of the graft or arterial anastomosis.  CDeitra Mayo MD, FACS [facility] DATE OF DICTATION:   03/29/2019

## 2019-03-30 ENCOUNTER — Encounter (HOSPITAL_COMMUNITY): Payer: Self-pay | Admitting: Vascular Surgery

## 2019-03-30 DIAGNOSIS — E211 Secondary hyperparathyroidism, not elsewhere classified: Secondary | ICD-10-CM | POA: Diagnosis not present

## 2019-03-30 DIAGNOSIS — D631 Anemia in chronic kidney disease: Secondary | ICD-10-CM | POA: Diagnosis not present

## 2019-03-30 DIAGNOSIS — E1122 Type 2 diabetes mellitus with diabetic chronic kidney disease: Secondary | ICD-10-CM | POA: Diagnosis not present

## 2019-03-30 DIAGNOSIS — N2581 Secondary hyperparathyroidism of renal origin: Secondary | ICD-10-CM | POA: Diagnosis not present

## 2019-03-30 DIAGNOSIS — D509 Iron deficiency anemia, unspecified: Secondary | ICD-10-CM | POA: Diagnosis not present

## 2019-03-30 DIAGNOSIS — N186 End stage renal disease: Secondary | ICD-10-CM | POA: Diagnosis not present

## 2019-04-01 DIAGNOSIS — E211 Secondary hyperparathyroidism, not elsewhere classified: Secondary | ICD-10-CM | POA: Diagnosis not present

## 2019-04-01 DIAGNOSIS — N186 End stage renal disease: Secondary | ICD-10-CM | POA: Diagnosis not present

## 2019-04-01 DIAGNOSIS — E1122 Type 2 diabetes mellitus with diabetic chronic kidney disease: Secondary | ICD-10-CM | POA: Diagnosis not present

## 2019-04-01 DIAGNOSIS — N2581 Secondary hyperparathyroidism of renal origin: Secondary | ICD-10-CM | POA: Diagnosis not present

## 2019-04-01 DIAGNOSIS — D509 Iron deficiency anemia, unspecified: Secondary | ICD-10-CM | POA: Diagnosis not present

## 2019-04-01 DIAGNOSIS — D631 Anemia in chronic kidney disease: Secondary | ICD-10-CM | POA: Diagnosis not present

## 2019-04-04 DIAGNOSIS — D509 Iron deficiency anemia, unspecified: Secondary | ICD-10-CM | POA: Diagnosis not present

## 2019-04-04 DIAGNOSIS — E1122 Type 2 diabetes mellitus with diabetic chronic kidney disease: Secondary | ICD-10-CM | POA: Diagnosis not present

## 2019-04-04 DIAGNOSIS — N2581 Secondary hyperparathyroidism of renal origin: Secondary | ICD-10-CM | POA: Diagnosis not present

## 2019-04-04 DIAGNOSIS — E211 Secondary hyperparathyroidism, not elsewhere classified: Secondary | ICD-10-CM | POA: Diagnosis not present

## 2019-04-04 DIAGNOSIS — N186 End stage renal disease: Secondary | ICD-10-CM | POA: Diagnosis not present

## 2019-04-04 DIAGNOSIS — D631 Anemia in chronic kidney disease: Secondary | ICD-10-CM | POA: Diagnosis not present

## 2019-04-05 DIAGNOSIS — N186 End stage renal disease: Secondary | ICD-10-CM | POA: Diagnosis not present

## 2019-04-05 DIAGNOSIS — Z992 Dependence on renal dialysis: Secondary | ICD-10-CM | POA: Diagnosis not present

## 2019-04-05 DIAGNOSIS — T82868A Thrombosis of vascular prosthetic devices, implants and grafts, initial encounter: Secondary | ICD-10-CM | POA: Diagnosis not present

## 2019-04-06 DIAGNOSIS — N186 End stage renal disease: Secondary | ICD-10-CM | POA: Diagnosis not present

## 2019-04-06 DIAGNOSIS — E876 Hypokalemia: Secondary | ICD-10-CM | POA: Diagnosis not present

## 2019-04-06 DIAGNOSIS — D631 Anemia in chronic kidney disease: Secondary | ICD-10-CM | POA: Diagnosis not present

## 2019-04-06 DIAGNOSIS — Z992 Dependence on renal dialysis: Secondary | ICD-10-CM | POA: Diagnosis not present

## 2019-04-06 DIAGNOSIS — E1122 Type 2 diabetes mellitus with diabetic chronic kidney disease: Secondary | ICD-10-CM | POA: Diagnosis not present

## 2019-04-06 DIAGNOSIS — N2581 Secondary hyperparathyroidism of renal origin: Secondary | ICD-10-CM | POA: Diagnosis not present

## 2019-04-06 DIAGNOSIS — I480 Paroxysmal atrial fibrillation: Secondary | ICD-10-CM | POA: Diagnosis not present

## 2019-04-06 DIAGNOSIS — E211 Secondary hyperparathyroidism, not elsewhere classified: Secondary | ICD-10-CM | POA: Diagnosis not present

## 2019-04-06 DIAGNOSIS — J9611 Chronic respiratory failure with hypoxia: Secondary | ICD-10-CM | POA: Diagnosis not present

## 2019-04-06 DIAGNOSIS — I9589 Other hypotension: Secondary | ICD-10-CM | POA: Diagnosis not present

## 2019-04-06 DIAGNOSIS — T82868A Thrombosis of vascular prosthetic devices, implants and grafts, initial encounter: Secondary | ICD-10-CM | POA: Diagnosis not present

## 2019-04-06 DIAGNOSIS — R195 Other fecal abnormalities: Secondary | ICD-10-CM | POA: Diagnosis not present

## 2019-04-06 DIAGNOSIS — D509 Iron deficiency anemia, unspecified: Secondary | ICD-10-CM | POA: Diagnosis not present

## 2019-04-07 ENCOUNTER — Ambulatory Visit: Payer: Medicare Other | Admitting: Pulmonary Disease

## 2019-04-08 DIAGNOSIS — E1122 Type 2 diabetes mellitus with diabetic chronic kidney disease: Secondary | ICD-10-CM | POA: Diagnosis not present

## 2019-04-08 DIAGNOSIS — N186 End stage renal disease: Secondary | ICD-10-CM | POA: Diagnosis not present

## 2019-04-08 DIAGNOSIS — E211 Secondary hyperparathyroidism, not elsewhere classified: Secondary | ICD-10-CM | POA: Diagnosis not present

## 2019-04-08 DIAGNOSIS — D631 Anemia in chronic kidney disease: Secondary | ICD-10-CM | POA: Diagnosis not present

## 2019-04-08 DIAGNOSIS — D509 Iron deficiency anemia, unspecified: Secondary | ICD-10-CM | POA: Diagnosis not present

## 2019-04-08 DIAGNOSIS — N2581 Secondary hyperparathyroidism of renal origin: Secondary | ICD-10-CM | POA: Diagnosis not present

## 2019-04-11 DIAGNOSIS — D631 Anemia in chronic kidney disease: Secondary | ICD-10-CM | POA: Diagnosis not present

## 2019-04-11 DIAGNOSIS — E211 Secondary hyperparathyroidism, not elsewhere classified: Secondary | ICD-10-CM | POA: Diagnosis not present

## 2019-04-11 DIAGNOSIS — N186 End stage renal disease: Secondary | ICD-10-CM | POA: Diagnosis not present

## 2019-04-11 DIAGNOSIS — D509 Iron deficiency anemia, unspecified: Secondary | ICD-10-CM | POA: Diagnosis not present

## 2019-04-11 DIAGNOSIS — N2581 Secondary hyperparathyroidism of renal origin: Secondary | ICD-10-CM | POA: Diagnosis not present

## 2019-04-11 DIAGNOSIS — E1122 Type 2 diabetes mellitus with diabetic chronic kidney disease: Secondary | ICD-10-CM | POA: Diagnosis not present

## 2019-04-13 DIAGNOSIS — N186 End stage renal disease: Secondary | ICD-10-CM | POA: Diagnosis not present

## 2019-04-13 DIAGNOSIS — E211 Secondary hyperparathyroidism, not elsewhere classified: Secondary | ICD-10-CM | POA: Diagnosis not present

## 2019-04-13 DIAGNOSIS — E1122 Type 2 diabetes mellitus with diabetic chronic kidney disease: Secondary | ICD-10-CM | POA: Diagnosis not present

## 2019-04-13 DIAGNOSIS — D509 Iron deficiency anemia, unspecified: Secondary | ICD-10-CM | POA: Diagnosis not present

## 2019-04-13 DIAGNOSIS — N2581 Secondary hyperparathyroidism of renal origin: Secondary | ICD-10-CM | POA: Diagnosis not present

## 2019-04-13 DIAGNOSIS — D631 Anemia in chronic kidney disease: Secondary | ICD-10-CM | POA: Diagnosis not present

## 2019-04-14 DIAGNOSIS — H00014 Hordeolum externum left upper eyelid: Secondary | ICD-10-CM | POA: Diagnosis not present

## 2019-04-15 DIAGNOSIS — N2581 Secondary hyperparathyroidism of renal origin: Secondary | ICD-10-CM | POA: Diagnosis not present

## 2019-04-15 DIAGNOSIS — N186 End stage renal disease: Secondary | ICD-10-CM | POA: Diagnosis not present

## 2019-04-15 DIAGNOSIS — D631 Anemia in chronic kidney disease: Secondary | ICD-10-CM | POA: Diagnosis not present

## 2019-04-15 DIAGNOSIS — E211 Secondary hyperparathyroidism, not elsewhere classified: Secondary | ICD-10-CM | POA: Diagnosis not present

## 2019-04-15 DIAGNOSIS — D509 Iron deficiency anemia, unspecified: Secondary | ICD-10-CM | POA: Diagnosis not present

## 2019-04-15 DIAGNOSIS — E1122 Type 2 diabetes mellitus with diabetic chronic kidney disease: Secondary | ICD-10-CM | POA: Diagnosis not present

## 2019-04-18 DIAGNOSIS — N2581 Secondary hyperparathyroidism of renal origin: Secondary | ICD-10-CM | POA: Diagnosis not present

## 2019-04-18 DIAGNOSIS — B349 Viral infection, unspecified: Secondary | ICD-10-CM | POA: Diagnosis not present

## 2019-04-18 DIAGNOSIS — E1122 Type 2 diabetes mellitus with diabetic chronic kidney disease: Secondary | ICD-10-CM | POA: Diagnosis not present

## 2019-04-18 DIAGNOSIS — D631 Anemia in chronic kidney disease: Secondary | ICD-10-CM | POA: Diagnosis not present

## 2019-04-18 DIAGNOSIS — E211 Secondary hyperparathyroidism, not elsewhere classified: Secondary | ICD-10-CM | POA: Diagnosis not present

## 2019-04-18 DIAGNOSIS — N186 End stage renal disease: Secondary | ICD-10-CM | POA: Diagnosis not present

## 2019-04-18 DIAGNOSIS — D509 Iron deficiency anemia, unspecified: Secondary | ICD-10-CM | POA: Diagnosis not present

## 2019-04-20 DIAGNOSIS — E1122 Type 2 diabetes mellitus with diabetic chronic kidney disease: Secondary | ICD-10-CM | POA: Diagnosis not present

## 2019-04-20 DIAGNOSIS — D631 Anemia in chronic kidney disease: Secondary | ICD-10-CM | POA: Diagnosis not present

## 2019-04-20 DIAGNOSIS — N186 End stage renal disease: Secondary | ICD-10-CM | POA: Diagnosis not present

## 2019-04-20 DIAGNOSIS — N2581 Secondary hyperparathyroidism of renal origin: Secondary | ICD-10-CM | POA: Diagnosis not present

## 2019-04-20 DIAGNOSIS — D509 Iron deficiency anemia, unspecified: Secondary | ICD-10-CM | POA: Diagnosis not present

## 2019-04-20 DIAGNOSIS — E211 Secondary hyperparathyroidism, not elsewhere classified: Secondary | ICD-10-CM | POA: Diagnosis not present

## 2019-04-22 DIAGNOSIS — D509 Iron deficiency anemia, unspecified: Secondary | ICD-10-CM | POA: Diagnosis not present

## 2019-04-22 DIAGNOSIS — N2581 Secondary hyperparathyroidism of renal origin: Secondary | ICD-10-CM | POA: Diagnosis not present

## 2019-04-22 DIAGNOSIS — D631 Anemia in chronic kidney disease: Secondary | ICD-10-CM | POA: Diagnosis not present

## 2019-04-22 DIAGNOSIS — Z992 Dependence on renal dialysis: Secondary | ICD-10-CM | POA: Diagnosis not present

## 2019-04-22 DIAGNOSIS — E1122 Type 2 diabetes mellitus with diabetic chronic kidney disease: Secondary | ICD-10-CM | POA: Diagnosis not present

## 2019-04-22 DIAGNOSIS — E211 Secondary hyperparathyroidism, not elsewhere classified: Secondary | ICD-10-CM | POA: Diagnosis not present

## 2019-04-22 DIAGNOSIS — N186 End stage renal disease: Secondary | ICD-10-CM | POA: Diagnosis not present

## 2019-04-25 DIAGNOSIS — D509 Iron deficiency anemia, unspecified: Secondary | ICD-10-CM | POA: Diagnosis not present

## 2019-04-25 DIAGNOSIS — E1122 Type 2 diabetes mellitus with diabetic chronic kidney disease: Secondary | ICD-10-CM | POA: Diagnosis not present

## 2019-04-25 DIAGNOSIS — N2581 Secondary hyperparathyroidism of renal origin: Secondary | ICD-10-CM | POA: Diagnosis not present

## 2019-04-25 DIAGNOSIS — D631 Anemia in chronic kidney disease: Secondary | ICD-10-CM | POA: Diagnosis not present

## 2019-04-25 DIAGNOSIS — N186 End stage renal disease: Secondary | ICD-10-CM | POA: Diagnosis not present

## 2019-04-25 DIAGNOSIS — E211 Secondary hyperparathyroidism, not elsewhere classified: Secondary | ICD-10-CM | POA: Diagnosis not present

## 2019-04-27 DIAGNOSIS — N2581 Secondary hyperparathyroidism of renal origin: Secondary | ICD-10-CM | POA: Diagnosis not present

## 2019-04-27 DIAGNOSIS — Z9114 Patient's other noncompliance with medication regimen: Secondary | ICD-10-CM | POA: Diagnosis not present

## 2019-04-27 DIAGNOSIS — D631 Anemia in chronic kidney disease: Secondary | ICD-10-CM | POA: Diagnosis not present

## 2019-04-27 DIAGNOSIS — E1122 Type 2 diabetes mellitus with diabetic chronic kidney disease: Secondary | ICD-10-CM | POA: Diagnosis not present

## 2019-04-27 DIAGNOSIS — N186 End stage renal disease: Secondary | ICD-10-CM | POA: Diagnosis not present

## 2019-04-27 DIAGNOSIS — D509 Iron deficiency anemia, unspecified: Secondary | ICD-10-CM | POA: Diagnosis not present

## 2019-04-27 DIAGNOSIS — I480 Paroxysmal atrial fibrillation: Secondary | ICD-10-CM | POA: Diagnosis not present

## 2019-04-27 DIAGNOSIS — E211 Secondary hyperparathyroidism, not elsewhere classified: Secondary | ICD-10-CM | POA: Diagnosis not present

## 2019-04-28 DIAGNOSIS — N2581 Secondary hyperparathyroidism of renal origin: Secondary | ICD-10-CM | POA: Diagnosis not present

## 2019-04-28 DIAGNOSIS — N186 End stage renal disease: Secondary | ICD-10-CM | POA: Diagnosis not present

## 2019-04-29 DIAGNOSIS — N186 End stage renal disease: Secondary | ICD-10-CM | POA: Diagnosis not present

## 2019-04-29 DIAGNOSIS — N2581 Secondary hyperparathyroidism of renal origin: Secondary | ICD-10-CM | POA: Diagnosis not present

## 2019-04-29 DIAGNOSIS — E1122 Type 2 diabetes mellitus with diabetic chronic kidney disease: Secondary | ICD-10-CM | POA: Diagnosis not present

## 2019-04-29 DIAGNOSIS — D509 Iron deficiency anemia, unspecified: Secondary | ICD-10-CM | POA: Diagnosis not present

## 2019-04-29 DIAGNOSIS — E211 Secondary hyperparathyroidism, not elsewhere classified: Secondary | ICD-10-CM | POA: Diagnosis not present

## 2019-04-29 DIAGNOSIS — D631 Anemia in chronic kidney disease: Secondary | ICD-10-CM | POA: Diagnosis not present

## 2019-05-02 DIAGNOSIS — N186 End stage renal disease: Secondary | ICD-10-CM | POA: Diagnosis not present

## 2019-05-02 DIAGNOSIS — E1122 Type 2 diabetes mellitus with diabetic chronic kidney disease: Secondary | ICD-10-CM | POA: Diagnosis not present

## 2019-05-02 DIAGNOSIS — D631 Anemia in chronic kidney disease: Secondary | ICD-10-CM | POA: Diagnosis not present

## 2019-05-02 DIAGNOSIS — D509 Iron deficiency anemia, unspecified: Secondary | ICD-10-CM | POA: Diagnosis not present

## 2019-05-02 DIAGNOSIS — E211 Secondary hyperparathyroidism, not elsewhere classified: Secondary | ICD-10-CM | POA: Diagnosis not present

## 2019-05-02 DIAGNOSIS — N2581 Secondary hyperparathyroidism of renal origin: Secondary | ICD-10-CM | POA: Diagnosis not present

## 2019-05-04 DIAGNOSIS — N2581 Secondary hyperparathyroidism of renal origin: Secondary | ICD-10-CM | POA: Diagnosis not present

## 2019-05-04 DIAGNOSIS — D509 Iron deficiency anemia, unspecified: Secondary | ICD-10-CM | POA: Diagnosis not present

## 2019-05-04 DIAGNOSIS — E211 Secondary hyperparathyroidism, not elsewhere classified: Secondary | ICD-10-CM | POA: Diagnosis not present

## 2019-05-04 DIAGNOSIS — E1122 Type 2 diabetes mellitus with diabetic chronic kidney disease: Secondary | ICD-10-CM | POA: Diagnosis not present

## 2019-05-04 DIAGNOSIS — D631 Anemia in chronic kidney disease: Secondary | ICD-10-CM | POA: Diagnosis not present

## 2019-05-04 DIAGNOSIS — N186 End stage renal disease: Secondary | ICD-10-CM | POA: Diagnosis not present

## 2019-05-05 DIAGNOSIS — I480 Paroxysmal atrial fibrillation: Secondary | ICD-10-CM | POA: Diagnosis not present

## 2019-05-05 DIAGNOSIS — M150 Primary generalized (osteo)arthritis: Secondary | ICD-10-CM | POA: Diagnosis not present

## 2019-05-05 DIAGNOSIS — K5289 Other specified noninfective gastroenteritis and colitis: Secondary | ICD-10-CM | POA: Diagnosis not present

## 2019-05-05 DIAGNOSIS — J328 Other chronic sinusitis: Secondary | ICD-10-CM | POA: Diagnosis not present

## 2019-05-05 DIAGNOSIS — J9610 Chronic respiratory failure, unspecified whether with hypoxia or hypercapnia: Secondary | ICD-10-CM | POA: Diagnosis not present

## 2019-05-05 DIAGNOSIS — F3489 Other specified persistent mood disorders: Secondary | ICD-10-CM | POA: Diagnosis not present

## 2019-05-05 DIAGNOSIS — N186 End stage renal disease: Secondary | ICD-10-CM | POA: Diagnosis not present

## 2019-05-06 DIAGNOSIS — D509 Iron deficiency anemia, unspecified: Secondary | ICD-10-CM | POA: Diagnosis not present

## 2019-05-06 DIAGNOSIS — E211 Secondary hyperparathyroidism, not elsewhere classified: Secondary | ICD-10-CM | POA: Diagnosis not present

## 2019-05-06 DIAGNOSIS — D631 Anemia in chronic kidney disease: Secondary | ICD-10-CM | POA: Diagnosis not present

## 2019-05-06 DIAGNOSIS — N2581 Secondary hyperparathyroidism of renal origin: Secondary | ICD-10-CM | POA: Diagnosis not present

## 2019-05-06 DIAGNOSIS — E1122 Type 2 diabetes mellitus with diabetic chronic kidney disease: Secondary | ICD-10-CM | POA: Diagnosis not present

## 2019-05-06 DIAGNOSIS — N186 End stage renal disease: Secondary | ICD-10-CM | POA: Diagnosis not present

## 2019-05-09 DIAGNOSIS — E211 Secondary hyperparathyroidism, not elsewhere classified: Secondary | ICD-10-CM | POA: Diagnosis not present

## 2019-05-09 DIAGNOSIS — N2581 Secondary hyperparathyroidism of renal origin: Secondary | ICD-10-CM | POA: Diagnosis not present

## 2019-05-09 DIAGNOSIS — D631 Anemia in chronic kidney disease: Secondary | ICD-10-CM | POA: Diagnosis not present

## 2019-05-09 DIAGNOSIS — D509 Iron deficiency anemia, unspecified: Secondary | ICD-10-CM | POA: Diagnosis not present

## 2019-05-09 DIAGNOSIS — N186 End stage renal disease: Secondary | ICD-10-CM | POA: Diagnosis not present

## 2019-05-09 DIAGNOSIS — E1122 Type 2 diabetes mellitus with diabetic chronic kidney disease: Secondary | ICD-10-CM | POA: Diagnosis not present

## 2019-05-11 DIAGNOSIS — N186 End stage renal disease: Secondary | ICD-10-CM | POA: Diagnosis not present

## 2019-05-11 DIAGNOSIS — D509 Iron deficiency anemia, unspecified: Secondary | ICD-10-CM | POA: Diagnosis not present

## 2019-05-11 DIAGNOSIS — E1122 Type 2 diabetes mellitus with diabetic chronic kidney disease: Secondary | ICD-10-CM | POA: Diagnosis not present

## 2019-05-11 DIAGNOSIS — D631 Anemia in chronic kidney disease: Secondary | ICD-10-CM | POA: Diagnosis not present

## 2019-05-11 DIAGNOSIS — N2581 Secondary hyperparathyroidism of renal origin: Secondary | ICD-10-CM | POA: Diagnosis not present

## 2019-05-11 DIAGNOSIS — E211 Secondary hyperparathyroidism, not elsewhere classified: Secondary | ICD-10-CM | POA: Diagnosis not present

## 2019-05-13 DIAGNOSIS — N186 End stage renal disease: Secondary | ICD-10-CM | POA: Diagnosis not present

## 2019-05-13 DIAGNOSIS — N2581 Secondary hyperparathyroidism of renal origin: Secondary | ICD-10-CM | POA: Diagnosis not present

## 2019-05-13 DIAGNOSIS — D631 Anemia in chronic kidney disease: Secondary | ICD-10-CM | POA: Diagnosis not present

## 2019-05-13 DIAGNOSIS — E1122 Type 2 diabetes mellitus with diabetic chronic kidney disease: Secondary | ICD-10-CM | POA: Diagnosis not present

## 2019-05-13 DIAGNOSIS — J328 Other chronic sinusitis: Secondary | ICD-10-CM | POA: Diagnosis not present

## 2019-05-13 DIAGNOSIS — E211 Secondary hyperparathyroidism, not elsewhere classified: Secondary | ICD-10-CM | POA: Diagnosis not present

## 2019-05-13 DIAGNOSIS — D509 Iron deficiency anemia, unspecified: Secondary | ICD-10-CM | POA: Diagnosis not present

## 2019-05-16 ENCOUNTER — Emergency Department (HOSPITAL_COMMUNITY)
Admission: EM | Admit: 2019-05-16 | Discharge: 2019-05-16 | Disposition: A | Discharge status: Home / Self Care | Payer: Medicare Other | Attending: Emergency Medicine | Admitting: Emergency Medicine

## 2019-05-16 ENCOUNTER — Emergency Department (HOSPITAL_COMMUNITY): Payer: Medicare Other

## 2019-05-16 ENCOUNTER — Encounter (HOSPITAL_COMMUNITY): Payer: Self-pay | Admitting: Emergency Medicine

## 2019-05-16 ENCOUNTER — Other Ambulatory Visit: Payer: Self-pay

## 2019-05-16 DIAGNOSIS — K529 Noninfective gastroenteritis and colitis, unspecified: Secondary | ICD-10-CM | POA: Diagnosis not present

## 2019-05-16 DIAGNOSIS — M6281 Muscle weakness (generalized): Secondary | ICD-10-CM | POA: Diagnosis not present

## 2019-05-16 DIAGNOSIS — E611 Iron deficiency: Secondary | ICD-10-CM | POA: Diagnosis not present

## 2019-05-16 DIAGNOSIS — J9611 Chronic respiratory failure with hypoxia: Secondary | ICD-10-CM | POA: Diagnosis not present

## 2019-05-16 DIAGNOSIS — I132 Hypertensive heart and chronic kidney disease with heart failure and with stage 5 chronic kidney disease, or end stage renal disease: Secondary | ICD-10-CM | POA: Diagnosis not present

## 2019-05-16 DIAGNOSIS — I4891 Unspecified atrial fibrillation: Secondary | ICD-10-CM | POA: Diagnosis not present

## 2019-05-16 DIAGNOSIS — H00019 Hordeolum externum unspecified eye, unspecified eyelid: Secondary | ICD-10-CM | POA: Diagnosis not present

## 2019-05-16 DIAGNOSIS — E211 Secondary hyperparathyroidism, not elsewhere classified: Secondary | ICD-10-CM | POA: Diagnosis not present

## 2019-05-16 DIAGNOSIS — R5381 Other malaise: Secondary | ICD-10-CM | POA: Diagnosis not present

## 2019-05-16 DIAGNOSIS — Z7982 Long term (current) use of aspirin: Secondary | ICD-10-CM | POA: Diagnosis not present

## 2019-05-16 DIAGNOSIS — M2550 Pain in unspecified joint: Secondary | ICD-10-CM | POA: Diagnosis not present

## 2019-05-16 DIAGNOSIS — Z79899 Other long term (current) drug therapy: Secondary | ICD-10-CM | POA: Insufficient documentation

## 2019-05-16 DIAGNOSIS — Z992 Dependence on renal dialysis: Secondary | ICD-10-CM | POA: Insufficient documentation

## 2019-05-16 DIAGNOSIS — R531 Weakness: Secondary | ICD-10-CM | POA: Diagnosis not present

## 2019-05-16 DIAGNOSIS — E1122 Type 2 diabetes mellitus with diabetic chronic kidney disease: Secondary | ICD-10-CM | POA: Diagnosis not present

## 2019-05-16 DIAGNOSIS — R1312 Dysphagia, oropharyngeal phase: Secondary | ICD-10-CM | POA: Diagnosis not present

## 2019-05-16 DIAGNOSIS — N186 End stage renal disease: Secondary | ICD-10-CM | POA: Diagnosis not present

## 2019-05-16 DIAGNOSIS — Z9013 Acquired absence of bilateral breasts and nipples: Secondary | ICD-10-CM | POA: Diagnosis not present

## 2019-05-16 DIAGNOSIS — U071 COVID-19: Secondary | ICD-10-CM | POA: Insufficient documentation

## 2019-05-16 DIAGNOSIS — N2581 Secondary hyperparathyroidism of renal origin: Secondary | ICD-10-CM | POA: Diagnosis not present

## 2019-05-16 DIAGNOSIS — D631 Anemia in chronic kidney disease: Secondary | ICD-10-CM | POA: Diagnosis not present

## 2019-05-16 DIAGNOSIS — E1129 Type 2 diabetes mellitus with other diabetic kidney complication: Secondary | ICD-10-CM | POA: Diagnosis not present

## 2019-05-16 DIAGNOSIS — D509 Iron deficiency anemia, unspecified: Secondary | ICD-10-CM | POA: Diagnosis not present

## 2019-05-16 DIAGNOSIS — I5032 Chronic diastolic (congestive) heart failure: Secondary | ICD-10-CM | POA: Insufficient documentation

## 2019-05-16 DIAGNOSIS — R29898 Other symptoms and signs involving the musculoskeletal system: Secondary | ICD-10-CM | POA: Diagnosis not present

## 2019-05-16 DIAGNOSIS — R42 Dizziness and giddiness: Secondary | ICD-10-CM | POA: Diagnosis not present

## 2019-05-16 DIAGNOSIS — I480 Paroxysmal atrial fibrillation: Secondary | ICD-10-CM | POA: Diagnosis not present

## 2019-05-16 DIAGNOSIS — Z853 Personal history of malignant neoplasm of breast: Secondary | ICD-10-CM | POA: Insufficient documentation

## 2019-05-16 DIAGNOSIS — I959 Hypotension, unspecified: Secondary | ICD-10-CM | POA: Diagnosis not present

## 2019-05-16 DIAGNOSIS — R457 State of emotional shock and stress, unspecified: Secondary | ICD-10-CM | POA: Diagnosis not present

## 2019-05-16 DIAGNOSIS — Z880 Allergy status to penicillin: Secondary | ICD-10-CM | POA: Insufficient documentation

## 2019-05-16 DIAGNOSIS — R000 Tachycardia, unspecified: Secondary | ICD-10-CM | POA: Diagnosis not present

## 2019-05-16 DIAGNOSIS — R293 Abnormal posture: Secondary | ICD-10-CM | POA: Diagnosis not present

## 2019-05-16 DIAGNOSIS — E1140 Type 2 diabetes mellitus with diabetic neuropathy, unspecified: Secondary | ICD-10-CM | POA: Diagnosis not present

## 2019-05-16 DIAGNOSIS — Z209 Contact with and (suspected) exposure to unspecified communicable disease: Secondary | ICD-10-CM | POA: Diagnosis not present

## 2019-05-16 DIAGNOSIS — Z7401 Bed confinement status: Secondary | ICD-10-CM | POA: Diagnosis not present

## 2019-05-16 DIAGNOSIS — R278 Other lack of coordination: Secondary | ICD-10-CM | POA: Diagnosis not present

## 2019-05-16 DIAGNOSIS — R008 Other abnormalities of heart beat: Secondary | ICD-10-CM | POA: Diagnosis not present

## 2019-05-16 LAB — CBG MONITORING, ED: Glucose-Capillary: 92 mg/dL (ref 70–99)

## 2019-05-16 LAB — BASIC METABOLIC PANEL
Anion gap: 15 (ref 5–15)
BUN: 9 mg/dL (ref 8–23)
CO2: 25 mmol/L (ref 22–32)
Calcium: 7.8 mg/dL — ABNORMAL LOW (ref 8.9–10.3)
Chloride: 97 mmol/L — ABNORMAL LOW (ref 98–111)
Creatinine, Ser: 2.22 mg/dL — ABNORMAL HIGH (ref 0.44–1.00)
GFR calc Af Amer: 23 mL/min — ABNORMAL LOW (ref >60)
GFR calc non Af Amer: 20 mL/min — ABNORMAL LOW (ref >60)
Glucose, Bld: 92 mg/dL (ref 70–99)
Potassium: 3.6 mmol/L (ref 3.5–5.1)
Sodium: 137 mmol/L (ref 135–145)

## 2019-05-16 LAB — SARS CORONAVIRUS 2 BY RT PCR (HOSPITAL ORDER, PERFORMED IN ~~LOC~~ HOSPITAL LAB): SARS Coronavirus 2: POSITIVE — AB

## 2019-05-16 LAB — CBC WITH DIFFERENTIAL/PLATELET
Abs Immature Granulocytes: 0.03 10*3/uL (ref 0.00–0.07)
Basophils Absolute: 0 10*3/uL (ref 0.0–0.1)
Basophils Relative: 1 %
Eosinophils Absolute: 0 10*3/uL (ref 0.0–0.5)
Eosinophils Relative: 1 %
HCT: 38.1 % (ref 36.0–46.0)
Hemoglobin: 11.8 g/dL — ABNORMAL LOW (ref 12.0–15.0)
Immature Granulocytes: 1 %
Lymphocytes Relative: 23 %
Lymphs Abs: 1.5 10*3/uL (ref 0.7–4.0)
MCH: 32.8 pg (ref 26.0–34.0)
MCHC: 31 g/dL (ref 30.0–36.0)
MCV: 105.8 fL — ABNORMAL HIGH (ref 80.0–100.0)
Monocytes Absolute: 0.3 10*3/uL (ref 0.1–1.0)
Monocytes Relative: 4 %
Neutro Abs: 4.6 10*3/uL (ref 1.7–7.7)
Neutrophils Relative %: 70 %
Platelets: 152 10*3/uL (ref 150–400)
RBC: 3.6 MIL/uL — ABNORMAL LOW (ref 3.87–5.11)
RDW: 14.4 % (ref 11.5–15.5)
WBC: 6.4 10*3/uL (ref 4.0–10.5)
nRBC: 0 % (ref 0.0–0.2)

## 2019-05-16 LAB — MAGNESIUM: Magnesium: 1.8 mg/dL (ref 1.7–2.4)

## 2019-05-16 LAB — TROPONIN I: Troponin I: 0.03 ng/mL (ref <0.03)

## 2019-05-16 MED ORDER — METOPROLOL TARTRATE 25 MG PO TABS: 12.5000 mg | ORAL_TABLET | Freq: Two times a day (BID) | ORAL | 0 refills | Status: DC

## 2019-05-16 MED ORDER — METOPROLOL TARTRATE 5 MG/5ML IV SOLN
2.0000 mg | Freq: Once | INTRAVENOUS | Status: AC
  Administered 2019-05-16: 18:00:00 2 mg via INTRAVENOUS
  Filled 2019-05-16: qty 5

## 2019-05-16 NOTE — Discharge Instructions (Signed)
Return for worsening shortness of breath

## 2019-05-16 NOTE — ED Provider Notes (Addendum)
[facility] EMERGENCY DEPARTMENT Provider Note   CSN: [id number] Arrival date & time: 05/16/19  1642    History   Chief Complaint Chief Complaint  Patient presents with   Atrial Fibrillation    HPI [name] James is a 83 y.o. female.     83 yo F with a chief complaint of palpitations.  The patient was getting dialysis today and it was noted that she was having a fast heart rate.  Felt like this was coming and going.  Heart rates into the 140s per EMS.  Patient has been somewhat fatigued for the last couple days.  She has had postnasal drip that is caused her to cough frequently.  That is a common problem for her.  She thinks is mildly worse over the past couple days.  Has had some abdominal pain off and on but not currently.  She denies overt chest pain.  Feels chronically short of breath with ambulation over the past couple days.  She has a history of atrial fibrillation and has been taken off of her rate control medications by Dr. Loletha Grayer.  She was able to finish dialysis today.  The history is provided by the patient.  Atrial Fibrillation  This is a new problem. The current episode started 2 days ago. The problem occurs constantly. The problem has not changed since onset.Associated symptoms include abdominal pain (off and on, not currently) and shortness of breath. Pertinent negatives include no chest pain and no headaches. Nothing aggravates the symptoms. Nothing relieves the symptoms. She has tried nothing for the symptoms. The treatment provided no relief.    Past Medical History:  Diagnosis Date   Anemia, chronic disease    Anxiety    Arthritis    knees   Atrial fibrillation (HCC)    Barrett esophagus    Benign paroxysmal positional vertigo    Breast cancer ([place]) 1980s   bil mastectomies, no radiation or chemo   Cataract    Chest pain, atypical 12/05/2008   R/Lmv- normal perfusion all regions, noe ECG changes    CHF (congestive heart failure)  ([place]) 05/13/2011   echo - SF>68%; stage 1 diastolic dysfunction; elevated LV filling pressure, MAC   CKD (chronic kidney disease), stage III (HCC)    Claudication ([place]) 10/30/2005   doppler - normal evaluation, no evidence of aneurysm, diameter reduction, dissection, compression or vascular abnormality)   Complication of anesthesia    Controlled type 2 diabetes mellitus with diabetic polyneuropathy, with long-term current use of insulin ([place]) 04/17/2016   Dyslipidemia    Dysrhythmia    Atrial Fibrillation   Edema    ESRD (end stage renal disease) on dialysis ([place])    "[facility]; [address]; MWF" (07/06/2018)   Family history of adverse reaction to anesthesia    son also has nausea    Fever blister 12/2016   Gastritis and gastroduodenitis    GERD (gastroesophageal reflux disease)    Gout    Hemodialysis-associated hypotension    HLD (hyperlipidemia)    Hypertension    IBS (irritable bowel syndrome)    Insomnia    Iron deficiency anemia    Junctional bradycardia 08/25/2018   LFT elevation    Long term current use of amiodarone 10/17/2016   Morbid (severe) obesity due to excess calories ([place]) 04/29/2017   Morbid obesity ([place])    On home oxygen therapy    "2L; 24/7" (07/06/2018)   PAF (paroxysmal atrial fibrillation) (HCC)    Peripheral neuropathy  Pneumonia 12/2016   PONV (postoperative nausea and vomiting)    Renal cyst    Renal insufficiency 10/30/2005   doppler - abn resistance consistent w/ parenchymal disease   Renovascular hypertension    Type 2 diabetes mellitus with diabetic neuropathy, with long-term current use of insulin ([place])    Vertigo     Patient Active Problem List   Diagnosis Date Noted   Nausea and vomiting 03/14/2019   Chronic diarrhea 03/14/2019   Dysuria 03/14/2019   UTI (urinary tract infection) 03/14/2019   Aortic atherosclerosis ([place]) 12/30/2018   Hypoalbuminemia due to protein-calorie malnutrition ([place]) 12/30/2018     Sick sinus syndrome (HCC)    Symptomatic bradycardia 08/24/2018   Gout 08/23/2018   Anxiety 08/23/2018   Pneumothorax, left 10/13/2017   Multiple lung nodules on CT 10/06/2017   Pleural effusion, left 10/06/2017   Diabetic polyneuropathy associated with type 2 diabetes mellitus ([place]) 07/30/2017   Onychomycosis of toenail 07/30/2017   Idiopathic chronic venous hypertension of both lower extremities with inflammation 07/30/2017   Chronic respiratory failure with hypoxia ([place]) 04/29/2017   Advance care planning    Goals of care, counseling/discussion    Palliative care by specialist    Pressure ulcer 01/19/2017   History of pressure ulcer 10/17/2016   GAD (generalized anxiety disorder) 10/17/2016   Osteopenia determined by x-ray 04/17/2016   Type 2 diabetes mellitus with diabetic neuropathy ([place]) 06/08/2015   Type 2 diabetes, controlled, with renal manifestation ([place]) 06/08/2015   PVD (peripheral vascular disease) ([place]) 06/08/2015   Swelling of limb-Left arm 04/10/2015   Nausea with vomiting 01/12/2015   Normocytic anemia 01/12/2015   Paroxysmal atrial fibrillation ([place]) 12/22/2014   Melena    ESRD on dialysis ([place]) 12/19/2014   Chronic diastolic (congestive) heart failure ([place]) 12/19/2014   Palpitation 12/19/2014   Anemia, chronic disease 12/13/2014   Hemodialysis-associated hypotension 12/13/2014   Pulmonary edema 11/10/2014   Physical deconditioning 10/29/2014   Gastritis and gastroduodenitis 10/24/2014   Heme positive stool 10/24/2014   Morbid obesity ([place]) 10/18/2014   Edema 10/18/2014   Essential hypertension 10/17/2014   DM type 2, uncontrolled, with renal complications ([place]) 70/48/8891   Dyslipidemia 10/17/2014    Past Surgical History:  Procedure Laterality Date   A/V FISTULAGRAM Left 03/19/2018   Procedure: A/V FISTULAGRAM;  Surgeon: Angelia Mould, MD;  Location: [facility];  Service: Cardiovascular;   Laterality: Left;   A/V FISTULAGRAM N/A 12/24/2018   Procedure: A/V FISTULAGRAM - Right Arm;  Surgeon: Angelia Mould, MD;  Location: [facility];  Service: Cardiovascular;  Laterality: N/A;   A/V FISTULAGRAM Right 03/29/2019   Procedure: A/V FISTULAGRAM;  Surgeon: Angelia Mould, MD;  Location: [facility];  Service: Cardiovascular;  Laterality: Right;   A/V SHUNTOGRAM Left 01/19/2018   Procedure: A/V SHUNTOGRAM;  Surgeon: Serafina Mitchell, MD;  Location: [facility];  Service: Cardiovascular;  Laterality: Left;   ANGIOPLASTY Left 02/12/2015   Procedure: ANGIOPLASTY;  Surgeon: Angelia Mould, MD;  Location[facility] CATH LAB;  Service: Cardiovascular;  Laterality: Left;  AVF   AV FISTULA PLACEMENT Left 11/30/2014   Procedure: ARTERIOVENOUS (AV) FISTULA CREATION LEFT ARM;  Surgeon: Angelia Mould, MD;  Location: [facility];  Service: Vascular;  Laterality: Left;   AV FISTULA PLACEMENT Left 03/20/2015   Procedure:  Inserton of Left Upper Arm Gortex Graft;  Surgeon: Angelia Mould, MD;  Location: [facility];  Service: Vascular;  Laterality: Left;  AV FISTULA PLACEMENT Left 04/12/2015   Procedure: INSERTION OF LEFT ARM  ARTERIOVENOUS GORE-TEX GRAFT ;  Surgeon: Angelia Mould, MD;  Location: [facility];  Service: Vascular;  Laterality: Left;   AV FISTULA PLACEMENT Right 06/08/2018   Procedure: INSERTION OF ARTERIOVENOUS (AV) GORE-TEX GRAFT ARM USING 4-7MM X 45CM GORETEX GRAFT;  Surgeon: Angelia Mould, MD;  Location: [facility];  Service: Vascular;  Laterality: Right;   BACK SURGERY  ~1980   CATARACT EXTRACTION Right    CHOLECYSTECTOMY     ESOPHAGOGASTRODUODENOSCOPY N/A 10/24/2014   Procedure: ESOPHAGOGASTRODUODENOSCOPY (EGD);  Surgeon: Jerene Bears, MD;  Location: [facility] ENDOSCOPY;  Service: Endoscopy;  Laterality: N/A;   EXCHANGE OF A DIALYSIS CATHETER Left 11/30/2014   Procedure: EXCHANGE OF A DIALYSIS CATHETER, LEFT INTERNAL JUGULAR;  Surgeon:  Angelia Mould, MD;  Location: [facility];  Service: Vascular;  Laterality: Left;   EYE SURGERY Left    transplant   FISTULOGRAM N/A 02/12/2015   Procedure: FISTULOGRAM;  Surgeon: Angelia Mould, MD;  Location: [facility] CATH LAB;  Service: Cardiovascular;  Laterality: N/A;   HEMORRHOID SURGERY     IR AV DIALY SHUNT INTRO NEEDLE/INTRACATH INITIAL W/PTA/IMG RIGHT Right 08/25/2018   IR AV DIALY SHUNT INTRO NEEDLE/INTRACATH INITIAL W/PTA/IMG RIGHT Right 11/17/2018   IR US GUIDE VASC ACCESS RIGHT  08/25/2018   LIGATION ARTERIOVENOUS GORTEX GRAFT Left 08/27/2018   Procedure: LIGATION ARTERIOVENOUS GORTEX GRAFT ARM;  Surgeon: Angelia Mould, MD;  Location: [facility];  Service: Vascular;  Laterality: Left;   LIGATION OF ARTERIOVENOUS  FISTULA Left 03/20/2015   Procedure: LIGATION OF ARTERIOVENOUS  FISTULA;  Surgeon: Angelia Mould, MD;  Location: [facility];  Service: Vascular;  Laterality: Left;   MASTECTOMY Bilateral ~1982   bil breast reconstruction with implants   ORIF TIBIA & FIBULA FRACTURES Left 2007   also had left non displaced malleolar fracture.    PERIPHERAL VASCULAR BALLOON ANGIOPLASTY Left 01/19/2018   Procedure: PERIPHERAL VASCULAR BALLOON ANGIOPLASTY;  Surgeon: Serafina Mitchell, MD;  Location: [facility];  Service: Cardiovascular;  Laterality: Left;  fistula   PERIPHERAL VASCULAR BALLOON ANGIOPLASTY Left 03/19/2018   Procedure: PERIPHERAL VASCULAR BALLOON ANGIOPLASTY;  Surgeon: Angelia Mould, MD;  Location: [facility];  Service: Cardiovascular;  Laterality: Left;  UPPER ARM FISTULA   PERIPHERAL VASCULAR BALLOON ANGIOPLASTY Right 12/24/2018   Procedure: PERIPHERAL VASCULAR BALLOON ANGIOPLASTY;  Surgeon: Angelia Mould, MD;  Location: [facility];  Service: Cardiovascular;  Laterality: Right;  arm fistula   PERIPHERAL VASCULAR BALLOON ANGIOPLASTY  03/29/2019   Procedure: PERIPHERAL VASCULAR BALLOON ANGIOPLASTY;  Surgeon: Angelia Mould, MD;  Location: [facility];  Service: Cardiovascular;;   REMOVAL OF GRAFT Left 04/12/2015   Procedure: REMOVAL OF LEFT ARM ARTERIOVENOUS GORE-TEX GRAFT;  Surgeon: Angelia Mould, MD;  Location: [facility];  Service: Vascular;  Laterality: Left;     OB History   No obstetric history on file.      Home Medications    Prior to Admission medications   Medication Sig Start Date End Date Taking? Authorizing Provider  acetaminophen (TYLENOL) 325 MG tablet Take 650 mg by mouth every 6 (six) hours as needed for fever.     [provider]  allopurinol (ZYLOPRIM) 100 MG tablet Take 100 mg by mouth daily. (0900)    [provider]  aspirin 81 MG chewable tablet Chew 81 mg by mouth daily. (0900)    [provider]  B Complex-C-Folic Acid (NEPHRO VITAMINS) 0.8 MG TABS Take 1 tablet by mouth daily. (0900)    [provider]  Brinzolamide-Brimonidine (SIMBRINZA) 1-0.2 % SUSP Place 1 drop into both eyes 2 (two) times daily. (0900 & 2100)    [provider]  calcium acetate (PHOSLO) 667 MG capsule Take 1,334 mg by mouth 3 (three) times daily with meals.    [provider]  diazepam (VALIUM) 5 MG tablet Take 1 tablet (5 mg total) by mouth at bedtime. Patient taking differently: Take 5 mg by mouth at bedtime. (2100) 07/08/18   Arrien, Jimmy Picket, MD  diclofenac sodium (VOLTAREN) 1 % GEL Apply 4 g topically 4 (four) times daily. Applied to right knee    [provider]  DIMETHICONE, TOPICAL, 5 % CREA Apply 1 application topically See admin instructions. Place a nickel-sized amount onto buttock(s) area 2 times a day for barrier protection    [provider]  Lidocaine (ASPERCREME LIDOCAINE) 4 % PTCH Place 1 patch onto the skin daily. Applied to right knee (on at 0800 & off at 2000)    [provider]  loperamide (IMODIUM A-D) 2 MG tablet Take 2 tablets (4 mg total) by mouth every 8 (eight) hours as needed  for diarrhea or loose stools (for diarrhea). 03/01/19   Pyrtle, Lajuan Lines, MD  loratadine (CLARITIN) 10 MG tablet Take 10 mg by mouth daily. (0900)    [provider]  meclizine (ANTIVERT) 25 MG tablet Take 25 mg by mouth every 8 (eight) hours as needed for dizziness (vertigo).     [provider]  Melatonin 3 MG TABS Take 6 mg by mouth at bedtime.     [provider]  metoprolol tartrate (LOPRESSOR) 25 MG tablet Take 0.5 tablets (12.5 mg total) by mouth 2 (two) times daily. 05/16/19   Deno Etienne, DO  midodrine (PROAMATINE) 10 MG tablet Take 10 mg by mouth See admin instructions. Take 10 mg by mouth WITH MORNING MEDS AT FACILITY on Mon/Wed/Fri- dialysis days Take 10 mg by mouth at dialysis on Mon/Wed/Fri    [provider]  midodrine (PROAMATINE) 2.5 MG tablet Take 2.5 mg by mouth See admin instructions. 3 times daily on Sun Tues Thurs and Sat , and 2 times daily on Mon Wed and Fri after Dialysis    [provider]  OXYGEN Inhale 2 L into the lungs continuous.     [provider]  promethazine (PHENERGAN) 25 MG tablet Take 1 tablet (25 mg total) by mouth every 8 (eight) hours as needed for nausea or vomiting. 03/01/19   Pyrtle, Lajuan Lines, MD  traZODone (DESYREL) 50 MG tablet Take 25 mg by mouth at bedtime. (2100)    [provider]    Family History Family History  Problem Relation Age of Onset   Diabetes Mother    Heart disease Mother    Non-Hodgkin's lymphoma Mother    Heart attack Father    Stroke Father    Parkinson's disease Brother    Lung disease Brother    Heart disease Brother    Multiple myeloma Sister     Social History Social History   Tobacco Use   Smoking status: Never Smoker   Smokeless tobacco: Never Used  Substance Use Topics   Alcohol use: No   Drug use: No     Allergies   Erythromycin; Codeine; Erythromycin base; Penicillin g; Penicillins; and Zantac [ranitidine hcl]   Review of  Systems Review of Systems  Constitutional: Negative for chills and fever.  HENT: Negative for congestion and rhinorrhea.   Eyes: Negative for redness and visual disturbance.  Respiratory: Positive for shortness of breath. Negative for wheezing.   Cardiovascular: Negative for chest pain and palpitations.  Gastrointestinal: Positive for abdominal pain (off and on, not currently). Negative for nausea and vomiting.  Genitourinary: Negative for dysuria and urgency.  Musculoskeletal: Negative for arthralgias and myalgias.  Skin: Negative for pallor and wound.  Neurological: Negative for dizziness and headaches.     Physical Exam Updated Vital Signs BP 140/64    Pulse 77    Temp 98.1 F (36.7 C) (Oral)    Resp 17    SpO2 100%   Physical Exam Vitals signs and nursing note reviewed.  Constitutional:      General: She is not in acute distress.    Appearance: She is well-developed. She is not diaphoretic.  HENT:     Head: Normocephalic and atraumatic.  Eyes:     Pupils: Pupils are equal, round, and reactive to light.  Neck:     Musculoskeletal: Normal range of motion and neck supple.  Cardiovascular:     Rate and Rhythm: Tachycardia present. Rhythm irregularly irregular.     Heart sounds: No murmur. No friction rub. No gallop.   Pulmonary:     Effort: Pulmonary effort is normal.     Breath sounds: No wheezing or rales.  Abdominal:     General: There is no distension.     Palpations: Abdomen is soft.     Tenderness: There is no abdominal tenderness.  Musculoskeletal:        General: No tenderness.  Skin:    General: Skin is warm and dry.  Neurological:     Mental Status: She is alert and oriented to person, place, and time.  Psychiatric:        Behavior: Behavior normal.      ED Treatments / Results  Labs (all labs ordered are listed, but only abnormal results are displayed) Labs Reviewed  SARS CORONAVIRUS 2 (HOSPITAL ORDER, [facility]) -  Abnormal; Notable for the following components:      Result Value   SARS Coronavirus 2 POSITIVE (*)    All other components within normal limits  CBC WITH DIFFERENTIAL/PLATELET - Abnormal; Notable for the following components:   RBC 3.60 (*)    Hemoglobin 11.8 (*)    MCV 105.8 (*)    All other components within normal limits  BASIC METABOLIC PANEL - Abnormal; Notable for the following components:   Chloride 97 (*)    Creatinine, Ser 2.22 (*)    Calcium 7.8 (*)    GFR calc non Af Amer 20 (*)    GFR calc Af Amer 23 (*)    All other components within normal limits  TROPONIN I - Abnormal; Notable for the following components:   Troponin I 0.03 (*)    All other components within normal limits  MAGNESIUM  CBG MONITORING, ED    EKG EKG Interpretation  Date/Time:  Monday May 16 2019 16:50:33 EDT Ventricular Rate:  84 PR Interval:    QRS Duration: 103 QT Interval:  406 QTC Calculation: 480 R Axis:   21 Text Interpretation:  Atrial fibrillation Paired ventricular premature complexes Artifact in lead(s) I II III aVR aVL Since last tracing rate faster Confirmed by Deno Etienne [id number]) on 05/16/2019 5:23:02 PM   Radiology Dg Chest Port 1 View  Result Date: 05/16/2019 CLINICAL  DATA:  83 year old female with irregular heart rate EXAM: PORTABLE CHEST 1 VIEW COMPARISON:  03/13/2019, CT 12/28/2018 FINDINGS: Cardiomediastinal silhouette unchanged in size and contour. Similar appearance of ill-defined opacity at the left lung base with blunting of left costophrenic angle and obscuration of left hemidiaphragm. New haziness at the right lung base with blunting of the right costophrenic angle. No pneumothorax. No confluent airspace disease. Chronic lung changes. IMPRESSION: Similar appearance of left-sided pleural effusion with associated atelectasis/consolidation. There appears to be new trace right-sided pleural effusion. Electronically Signed   By: Corrie Mckusick D.O.   On: 05/16/2019 17:44     Procedures Procedures (including critical care time)  Medications Ordered in ED Medications  metoprolol tartrate (LOPRESSOR) injection 2 mg (2 mg Intravenous Given 05/16/19 1807)     Initial Impression / Assessment and Plan / ED Course  I have reviewed the triage vital signs and the nursing notes.  Pertinent labs & imaging results that were available during my care of the patient were reviewed by me and considered in my medical decision making (see chart for details).        83 yo F with a chief complaint of palpitations.  Patient has a history of paroxysmal atrial fibrillation.  She is felt unwell for the past couple days and has had some posterior nasal drip.  Will obtain a chest x-ray to evaluate for pneumonia.  Lab work to evaluate for electrolyte abnormality.  The patient lives in a nursing home and is at high risk for the novel coronavirus.  She has had a test done previously that was negative.  She is not sure how long ago this was tested.  I will obtain a repeat test today.  She has multiple cardiology notes that were reviewed in the system, it seems the patient had a issue with symptomatic bradycardia.  I will give 1 dose of metoprolol as the patient is still having rates into the 140s.  Patient was found to have the novel coronavirus.  She is having no issues breathing her oxygen saturation is 100% on her home 2 L.  Patient back in normal sinus rhythm on reassessment.  I discussed case with Dr. Percival Spanish, based on the prior notes in the chart he recommended not starting on any chronotropic medications.  We will have her call her cardiologist on Tuesday.  7:09 PM:  I have discussed the diagnosis/risks/treatment options with the patient and believe the pt to be eligible for discharge home to follow-up with PCP, cards. We also discussed returning to the ED immediately if new or worsening sx occur. We discussed the sx which are most concerning (e.g., sudden worsening pain, fever,  inability to tolerate by mouth) that necessitate immediate return. Medications administered to the patient during their visit and any new prescriptions provided to the patient are listed below.  Medications given during this visit Medications  metoprolol tartrate (LOPRESSOR) injection 2 mg (2 mg Intravenous Given 05/16/19 1807)     The patient appears reasonably screen and/or stabilized for discharge and I doubt any other medical condition or other [facility] requiring further screening, evaluation, or treatment in the ED at this time prior to discharge.    Final Clinical Impressions(s) / ED Diagnoses   Final diagnoses:  Atrial fibrillation with RVR ([place])  COVID-19 virus infection    ED Discharge Orders         Ordered    metoprolol tartrate (LOPRESSOR) 25 MG tablet  2 times daily     05/16/19  [place], Ramos, DO 05/16/19 [place], [place], DO 05/16/19 [id number]

## 2019-05-16 NOTE — ED Notes (Signed)
Patient verbalizes understanding of discharge instructions. Opportunity for questioning and answers were provided. Armband removed by staff, pt discharged from ED.  

## 2019-05-16 NOTE — ED Triage Notes (Signed)
Pt arrives via EMS from dialysis where staff reported pts HR was irregular. Pt states she is just tired and weak. EMS reports HR was A-fib but at a controlled rate.

## 2019-05-16 NOTE — ED Notes (Signed)
Called pt dtr Adriana Simas to give her updates. She is going to call her mom.

## 2019-05-16 NOTE — ED Notes (Signed)
Ptar called for pt 

## 2019-05-18 DIAGNOSIS — D631 Anemia in chronic kidney disease: Secondary | ICD-10-CM | POA: Diagnosis not present

## 2019-05-18 DIAGNOSIS — E1122 Type 2 diabetes mellitus with diabetic chronic kidney disease: Secondary | ICD-10-CM | POA: Diagnosis not present

## 2019-05-18 DIAGNOSIS — N2581 Secondary hyperparathyroidism of renal origin: Secondary | ICD-10-CM | POA: Diagnosis not present

## 2019-05-18 DIAGNOSIS — E211 Secondary hyperparathyroidism, not elsewhere classified: Secondary | ICD-10-CM | POA: Diagnosis not present

## 2019-05-18 DIAGNOSIS — N186 End stage renal disease: Secondary | ICD-10-CM | POA: Diagnosis not present

## 2019-05-18 DIAGNOSIS — D509 Iron deficiency anemia, unspecified: Secondary | ICD-10-CM | POA: Diagnosis not present

## 2019-05-19 ENCOUNTER — Telehealth: Payer: Self-pay | Admitting: Cardiology

## 2019-05-19 ENCOUNTER — Telehealth: Payer: Self-pay | Admitting: *Deleted

## 2019-05-19 NOTE — Telephone Encounter (Signed)
Sent MyChart msg

## 2019-05-19 NOTE — Telephone Encounter (Signed)
Left a message to call back to set up a virtual visit with Dr. Sallyanne Kuster or an AP.

## 2019-05-20 ENCOUNTER — Telehealth: Payer: Self-pay | Admitting: Cardiovascular Disease

## 2019-05-20 DIAGNOSIS — I480 Paroxysmal atrial fibrillation: Secondary | ICD-10-CM | POA: Diagnosis not present

## 2019-05-20 DIAGNOSIS — D631 Anemia in chronic kidney disease: Secondary | ICD-10-CM | POA: Diagnosis not present

## 2019-05-20 DIAGNOSIS — U071 COVID-19: Secondary | ICD-10-CM | POA: Diagnosis not present

## 2019-05-20 DIAGNOSIS — E211 Secondary hyperparathyroidism, not elsewhere classified: Secondary | ICD-10-CM | POA: Diagnosis not present

## 2019-05-20 DIAGNOSIS — D509 Iron deficiency anemia, unspecified: Secondary | ICD-10-CM | POA: Diagnosis not present

## 2019-05-20 DIAGNOSIS — N186 End stage renal disease: Secondary | ICD-10-CM | POA: Diagnosis not present

## 2019-05-20 DIAGNOSIS — E1122 Type 2 diabetes mellitus with diabetic chronic kidney disease: Secondary | ICD-10-CM | POA: Diagnosis not present

## 2019-05-20 DIAGNOSIS — N2581 Secondary hyperparathyroidism of renal origin: Secondary | ICD-10-CM | POA: Diagnosis not present

## 2019-05-20 MED ORDER — METOPROLOL TARTRATE 50 MG PO TABS: 50.0000 mg | ORAL_TABLET | Freq: Two times a day (BID) | ORAL | 3 refills | Status: DC

## 2019-05-20 NOTE — Telephone Encounter (Signed)
Daughter returned call she states that she received the VM. She will continue 25mg  BID. She was she was just wondering if pt should continue with the metoprolol 25mg  BID. Or is there something else that that pt should be taking daughter states that Dr C may need to change to something else. I will call [facility] and see what orders they have on the medication list for the metoprolol. Daughter does not think that she is taking the metoprolol.  Tried to call [facility] and speak with the nurse to verify metoprolol 25mg  BID s/w her nurse for today and she states that metoprolol is not on her medication list. I asked why and she states that they did not receive a discharge note from the ER. This is what the daughter mentioned to me that they told her. I will fax over ER d/c note 'to [facility] fax [phone].at [facility]. Amy will CB if she does not see this today. ER note faxed via Epic fax function

## 2019-05-20 NOTE — Telephone Encounter (Signed)
I reviewed the ED note: ED Discharge Orders metoprolol tartrate (LOPRESSOR) 25 MG tablet  2 times daily  Ordered 05/16/19 1907 She should be taking the metoprolol.  Left detailed message as stated above for daughter Coralyn Mark

## 2019-05-20 NOTE — Telephone Encounter (Signed)
The patient has an appointment on 06/02/2019 with Dr. Sallyanne Kuster.

## 2019-05-20 NOTE — Telephone Encounter (Signed)
Daughter of pt called. The pt was put on metoprolol in the hospital, and the Daughter was wondering if the pt should continue to take this medication.

## 2019-05-23 DIAGNOSIS — D631 Anemia in chronic kidney disease: Secondary | ICD-10-CM | POA: Diagnosis not present

## 2019-05-23 DIAGNOSIS — D509 Iron deficiency anemia, unspecified: Secondary | ICD-10-CM | POA: Diagnosis not present

## 2019-05-23 DIAGNOSIS — N186 End stage renal disease: Secondary | ICD-10-CM | POA: Diagnosis not present

## 2019-05-23 DIAGNOSIS — Z992 Dependence on renal dialysis: Secondary | ICD-10-CM | POA: Diagnosis not present

## 2019-05-23 DIAGNOSIS — E611 Iron deficiency: Secondary | ICD-10-CM | POA: Diagnosis not present

## 2019-05-23 DIAGNOSIS — E1122 Type 2 diabetes mellitus with diabetic chronic kidney disease: Secondary | ICD-10-CM | POA: Diagnosis not present

## 2019-05-23 DIAGNOSIS — E211 Secondary hyperparathyroidism, not elsewhere classified: Secondary | ICD-10-CM | POA: Diagnosis not present

## 2019-05-23 NOTE — Telephone Encounter (Signed)
[facility] and verified with Bjorn Loser, RN she did verify that Metoprolol 25mg  BID is on the medication list for pt.

## 2019-05-24 DIAGNOSIS — J9611 Chronic respiratory failure with hypoxia: Secondary | ICD-10-CM | POA: Diagnosis not present

## 2019-05-24 DIAGNOSIS — I480 Paroxysmal atrial fibrillation: Secondary | ICD-10-CM | POA: Diagnosis not present

## 2019-05-24 DIAGNOSIS — U071 COVID-19: Secondary | ICD-10-CM | POA: Diagnosis not present

## 2019-05-24 DIAGNOSIS — N186 End stage renal disease: Secondary | ICD-10-CM | POA: Diagnosis not present

## 2019-05-25 DIAGNOSIS — D509 Iron deficiency anemia, unspecified: Secondary | ICD-10-CM | POA: Diagnosis not present

## 2019-05-25 DIAGNOSIS — E1122 Type 2 diabetes mellitus with diabetic chronic kidney disease: Secondary | ICD-10-CM | POA: Diagnosis not present

## 2019-05-25 DIAGNOSIS — E211 Secondary hyperparathyroidism, not elsewhere classified: Secondary | ICD-10-CM | POA: Diagnosis not present

## 2019-05-25 DIAGNOSIS — N186 End stage renal disease: Secondary | ICD-10-CM | POA: Diagnosis not present

## 2019-05-25 DIAGNOSIS — D631 Anemia in chronic kidney disease: Secondary | ICD-10-CM | POA: Diagnosis not present

## 2019-05-25 DIAGNOSIS — E611 Iron deficiency: Secondary | ICD-10-CM | POA: Diagnosis not present

## 2019-05-26 ENCOUNTER — Telehealth: Payer: Self-pay | Admitting: Cardiovascular Disease

## 2019-05-26 DIAGNOSIS — H00019 Hordeolum externum unspecified eye, unspecified eyelid: Secondary | ICD-10-CM | POA: Diagnosis not present

## 2019-05-26 DIAGNOSIS — U071 COVID-19: Secondary | ICD-10-CM | POA: Diagnosis not present

## 2019-05-26 NOTE — Telephone Encounter (Signed)
Left a message for Dara to call back.

## 2019-05-26 NOTE — Telephone Encounter (Signed)
I would recommend holding metoprolol if HR is 50 or less, SBP 105 or less. Thanks

## 2019-05-26 NOTE — Telephone Encounter (Signed)
New message   Pt c/o medication issue:  1. Name of Medication: Metoprolol  2. How are you currently taking this medication (dosage and times per day)? 25mg  twice a day   3. Are you having a reaction (difficulty breathing--STAT)? n/a  4. What is your medication issue?  Dara from [facility] calling stating patients pulse has been under 60 and they want to know if the should keep giving her Metoprolol or hold off on the medications.  She's looking for parameters for the patients BP.

## 2019-05-26 NOTE — Telephone Encounter (Signed)
Spoke with Dara from [facility]. She report pt take Metoprolol 25 mg BID and her HR has been averaging around 60.  [phone]  She report pt is asymptomatic but was inquiring if MD recommend any parameters as to when to hold medication. She also report pt's BP has been running around 140/50's. Will route to MD

## 2019-05-27 ENCOUNTER — Telehealth: Payer: Self-pay | Admitting: Cardiovascular Disease

## 2019-05-27 DIAGNOSIS — E611 Iron deficiency: Secondary | ICD-10-CM | POA: Diagnosis not present

## 2019-05-27 DIAGNOSIS — N186 End stage renal disease: Secondary | ICD-10-CM | POA: Diagnosis not present

## 2019-05-27 DIAGNOSIS — E211 Secondary hyperparathyroidism, not elsewhere classified: Secondary | ICD-10-CM | POA: Diagnosis not present

## 2019-05-27 DIAGNOSIS — D509 Iron deficiency anemia, unspecified: Secondary | ICD-10-CM | POA: Diagnosis not present

## 2019-05-27 DIAGNOSIS — E1122 Type 2 diabetes mellitus with diabetic chronic kidney disease: Secondary | ICD-10-CM | POA: Diagnosis not present

## 2019-05-27 DIAGNOSIS — D631 Anemia in chronic kidney disease: Secondary | ICD-10-CM | POA: Diagnosis not present

## 2019-05-30 DIAGNOSIS — E611 Iron deficiency: Secondary | ICD-10-CM | POA: Diagnosis not present

## 2019-05-30 DIAGNOSIS — Z7401 Bed confinement status: Secondary | ICD-10-CM | POA: Diagnosis not present

## 2019-05-30 DIAGNOSIS — U071 COVID-19: Secondary | ICD-10-CM | POA: Diagnosis not present

## 2019-05-30 DIAGNOSIS — E1122 Type 2 diabetes mellitus with diabetic chronic kidney disease: Secondary | ICD-10-CM | POA: Diagnosis not present

## 2019-05-30 DIAGNOSIS — D509 Iron deficiency anemia, unspecified: Secondary | ICD-10-CM | POA: Diagnosis not present

## 2019-05-30 DIAGNOSIS — E211 Secondary hyperparathyroidism, not elsewhere classified: Secondary | ICD-10-CM | POA: Diagnosis not present

## 2019-05-30 DIAGNOSIS — D631 Anemia in chronic kidney disease: Secondary | ICD-10-CM | POA: Diagnosis not present

## 2019-05-30 DIAGNOSIS — R0902 Hypoxemia: Secondary | ICD-10-CM | POA: Diagnosis not present

## 2019-05-30 DIAGNOSIS — M2550 Pain in unspecified joint: Secondary | ICD-10-CM | POA: Diagnosis not present

## 2019-05-30 DIAGNOSIS — N186 End stage renal disease: Secondary | ICD-10-CM | POA: Diagnosis not present

## 2019-05-30 DIAGNOSIS — I10 Essential (primary) hypertension: Secondary | ICD-10-CM | POA: Diagnosis not present

## 2019-05-30 NOTE — Telephone Encounter (Signed)
Daughter returned call/ call [phone]/ consent/ my chart active/ pre reg completed

## 2019-05-30 NOTE — Telephone Encounter (Signed)
Called x3 for pre reg/LVM °

## 2019-05-30 NOTE — Telephone Encounter (Signed)
Daughter is returning your call.

## 2019-05-31 NOTE — Telephone Encounter (Signed)
Dara from [facility] update and voiced understanding.

## 2019-06-01 DIAGNOSIS — E611 Iron deficiency: Secondary | ICD-10-CM | POA: Diagnosis not present

## 2019-06-01 DIAGNOSIS — N186 End stage renal disease: Secondary | ICD-10-CM | POA: Diagnosis not present

## 2019-06-01 DIAGNOSIS — M2550 Pain in unspecified joint: Secondary | ICD-10-CM | POA: Diagnosis not present

## 2019-06-01 DIAGNOSIS — R5381 Other malaise: Secondary | ICD-10-CM | POA: Diagnosis not present

## 2019-06-01 DIAGNOSIS — E1122 Type 2 diabetes mellitus with diabetic chronic kidney disease: Secondary | ICD-10-CM | POA: Diagnosis not present

## 2019-06-01 DIAGNOSIS — D509 Iron deficiency anemia, unspecified: Secondary | ICD-10-CM | POA: Diagnosis not present

## 2019-06-01 DIAGNOSIS — Z7401 Bed confinement status: Secondary | ICD-10-CM | POA: Diagnosis not present

## 2019-06-01 DIAGNOSIS — U071 COVID-19: Secondary | ICD-10-CM | POA: Diagnosis not present

## 2019-06-01 DIAGNOSIS — E211 Secondary hyperparathyroidism, not elsewhere classified: Secondary | ICD-10-CM | POA: Diagnosis not present

## 2019-06-01 DIAGNOSIS — D631 Anemia in chronic kidney disease: Secondary | ICD-10-CM | POA: Diagnosis not present

## 2019-06-02 ENCOUNTER — Telehealth: Payer: Self-pay

## 2019-06-02 ENCOUNTER — Other Ambulatory Visit: Payer: Self-pay

## 2019-06-02 ENCOUNTER — Encounter: Payer: Self-pay | Admitting: Cardiovascular Disease

## 2019-06-02 ENCOUNTER — Telehealth (INDEPENDENT_AMBULATORY_CARE_PROVIDER_SITE_OTHER): Payer: Medicare Other | Admitting: Cardiovascular Disease

## 2019-06-02 DIAGNOSIS — I739 Peripheral vascular disease, unspecified: Secondary | ICD-10-CM

## 2019-06-02 DIAGNOSIS — E46 Unspecified protein-calorie malnutrition: Secondary | ICD-10-CM | POA: Diagnosis not present

## 2019-06-02 DIAGNOSIS — I495 Sick sinus syndrome: Secondary | ICD-10-CM

## 2019-06-02 DIAGNOSIS — I480 Paroxysmal atrial fibrillation: Secondary | ICD-10-CM

## 2019-06-02 DIAGNOSIS — I700 Atherosclerosis of aorta: Secondary | ICD-10-CM

## 2019-06-02 DIAGNOSIS — Z992 Dependence on renal dialysis: Secondary | ICD-10-CM | POA: Diagnosis not present

## 2019-06-02 DIAGNOSIS — E1129 Type 2 diabetes mellitus with other diabetic kidney complication: Secondary | ICD-10-CM | POA: Diagnosis not present

## 2019-06-02 DIAGNOSIS — E1165 Type 2 diabetes mellitus with hyperglycemia: Secondary | ICD-10-CM | POA: Diagnosis not present

## 2019-06-02 DIAGNOSIS — N186 End stage renal disease: Secondary | ICD-10-CM | POA: Diagnosis not present

## 2019-06-02 DIAGNOSIS — IMO0002 Reserved for concepts with insufficient information to code with codable children: Secondary | ICD-10-CM

## 2019-06-02 DIAGNOSIS — I953 Hypotension of hemodialysis: Secondary | ICD-10-CM | POA: Diagnosis not present

## 2019-06-02 DIAGNOSIS — E785 Hyperlipidemia, unspecified: Secondary | ICD-10-CM | POA: Diagnosis not present

## 2019-06-02 DIAGNOSIS — Z794 Long term (current) use of insulin: Secondary | ICD-10-CM

## 2019-06-02 DIAGNOSIS — E1122 Type 2 diabetes mellitus with diabetic chronic kidney disease: Secondary | ICD-10-CM | POA: Diagnosis not present

## 2019-06-02 NOTE — Patient Instructions (Signed)
Medication Instructions:  Your physician recommends that you continue on your current medications as directed. Please refer to the Current Medication list given to you today.  If you need a refill on your cardiac medications before your next appointment, please call your pharmacy.   Lab work: None ordered  Testing/Procedures: None ordered  Follow-Up: At [facility], you and your health needs are our priority.  As part of our continuing mission to provide you with exceptional heart care, we have created designated Provider Care Teams.  These Care Teams include your primary Cardiologist (physician) and Advanced Practice Providers (APPs -  Physician Assistants and Nurse Practitioners) who all work together to provide you with the care you need, when you need it. You will need a follow up appointment in 6 months.  Please call our office 2 months in advance to schedule this appointment.  You may see Sanda Klein, MD or one of the following Advanced Practice Providers on your designated Care Team: Almyra Deforest, PA-C Fabian Sharp, [place]

## 2019-06-02 NOTE — Progress Notes (Signed)
Sorry, fixed it

## 2019-06-02 NOTE — Progress Notes (Signed)
Virtual Visit via Telephone Note   This visit type was conducted due to national recommendations for restrictions regarding the COVID-19 Pandemic (e.g. social distancing) in an effort to limit this patient's exposure and mitigate transmission in our community.  Due to her co-morbid illnesses, this patient is at least at moderate risk for complications without adequate follow up.  This format is felt to be most appropriate for this patient at this time.  The patient did not have access to video technology/had technical difficulties with video requiring transitioning to audio format only (telephone).  All issues noted in this document were discussed and addressed.  No physical exam could be performed with this format.  Please refer to the patient's chart for her  consent to telehealth for [facility].   Date:  06/02/2019   ID:  [name] James, DOB 14-Feb-1936, MRN [id number]  Patient Location: Home Provider Location: Home  PCP:  [facility], [place]  Cardiologist:  Sanda Klein, MD  Electrophysiologist:  None   Evaluation Performed:  Follow-Up Visit  Chief Complaint:  Tachy-brady sd  History of Present Illness:    [name] James is a 83 y.o. female with with a hx of Paroxysmal atrial fibrillation , diabetes mellitus, end-stage renal disease on HD MWF, known aortic and coronary atherosclerosis, but with a normal nuclear stress test and normal left ventricular ejection fraction.   The patient does not currently have symptoms concerning for COVID-19 infection (fever, chills, cough, or new shortness of breath), but tested positive on May 25 at [facility] ED. she was referred to the emergency room for tachycardia during hemodialysis and was found to have atrial fibrillation with rapid ventricular response.  Rate control was easily achieved with a low dose of beta-blocker.  She had some issues with nausea and vomiting and transient low-grade fever, but has not had dyspnea or cough or other consequences of  the COVID-19 infection.  Her chest x-ray on May 25 showed a chronic small left pleural effusion and a new trace right-sided pleural effusion, without evidence of pneumonia.  She has not had serious symptomatic hypotension during hemodialysis and has not experienced syncope.  Occasionally her dose of beta-blocker is held for low blood pressure, but she has not had bradycardia.  She has an occluded right subclavian artery and has an AV fistula in her left upper extremity so her blood pressure is usually checked in 1 of her ankles.  She has not had angina pectoris.  She was aware of palpitations when she had RVR, but this has not bothered her recently.  She does not have edema.  She has lost another 10 pounds of weight and her appetite is poor.  She has been on oxygen 2 L by nasal cannula and the flow has not required increasing since her coronavirus infection.  07/06/2018 Echo described mild LVH, LVEF 65-70%, severe left atrial dilatation.  In July she had chest pain and junctional rhythm.  She was evaluated by EP and felt to be poor candidate for a pacemaker due to an occluded left subclavian artery hemodialysis via right arm fistula. Amiodarone and metoprolol were discontinued.  But in September, she was hospitalized with severe symptomatic junctional bradycardia and hypotension, responsive to atropine and IV fluids.  She was again evaluated by EP (a different provider) and it was felt that a pacemaker should not be implanted, for the same reasons.   She has not had either atrial fibrillation or bradycardia since discharge from the hospital in September.  She  requires immediate treatment for symptomatic hypotension. She has a Rx for hydralazine if BP is too high, but rarely needs it.  She is not on anticoagulation since she had problems with GI bleeding. She has been on chronic hemodialysis (MWF, Dr. [name], now Dr. [name]) since 2015. She is a permanent resident at [facility].  Note from  08/24/2018, Dr. [name]: "I have discussed the case with Dr. [name], [name], and [name]. She has symptomatic sinus node dysfunction and a clear cut indication for a PPM. Unfortunately her access is such the left left subclavian was previously used for HD but is now occluded based on the collaterals on her skin, a right arm that is being used for HD which is functioning poorly and will undergo revision with interventional radiology in the next few days. Placement of a PPM in her right femoral vein would be a consideration but examination of the area is particularly discouraging due to her massive obesity and multiple folds and evidence of chronic fungal infection. I strongly suspect that any insertion of a PPM from the right femoral vein would lead to an cardiac device infection with in a few months. She is quite sedentary and does not walk or transfer and lays in the bed much of the day. Her exam is accurately documented above. Other than an Epicardial PPM, we do not have an option for improving her HR other than possibly adding theophylline or encouraging her to drink more caffeine. She is approaching hospice level care".  And 08/26/2018 "She has no endovascular access sites. We cannot place an endovascular pacemaker except for a Micra device which is placed via right femoral vein into the RV apex. The placement of a single chamber PM in the RV in a patient with sinus rhythm will result in pacemaker syndrome, will make her feel worse and also make her device dependent. Placement of a standard PM via the right femoral vein will result in a device infection. The area is dirty.   Her only option for physiologic pacing would be to call CVTS and place and epicardial pacing system with an open chest procedure either with median sternotomy or thoracotomy. I feel strongly that she is not a candidate for this procedure as well.   I would consider stopping the timolol eye drops.   In summary there are no good  options for pacing this patient. As I noted, medical options that might increase her HR are theophylline. The therapeutic window for these drugs is very small however."     Past Medical History:  Diagnosis Date   Anemia, chronic disease    Anxiety    Arthritis    knees   Atrial fibrillation (HCC)    Barrett esophagus    Benign paroxysmal positional vertigo    Breast cancer ([place]) 1980s   bil mastectomies, no radiation or chemo   Cataract    Chest pain, atypical 12/05/2008   R/Lmv- normal perfusion all regions, noe ECG changes    CHF (congestive heart failure) ([place]) 05/13/2011   echo - ZD>63%; stage 1 diastolic dysfunction; elevated LV filling pressure, MAC   CKD (chronic kidney disease), stage III (HCC)    Claudication ([place]) 10/30/2005   doppler - normal evaluation, no evidence of aneurysm, diameter reduction, dissection, compression or vascular abnormality)   Complication of anesthesia    Controlled type 2 diabetes mellitus with diabetic polyneuropathy, with long-term current use of insulin ([place]) 04/17/2016   Dyslipidemia    Dysrhythmia    Atrial Fibrillation  Edema    ESRD (end stage renal disease) on dialysis ([place])    "[facility]; [address]; MWF" (07/06/2018)   Family history of adverse reaction to anesthesia    son also has nausea    Fever blister 12/2016   Gastritis and gastroduodenitis    GERD (gastroesophageal reflux disease)    Gout    Hemodialysis-associated hypotension    HLD (hyperlipidemia)    Hypertension    IBS (irritable bowel syndrome)    Insomnia    Iron deficiency anemia    Junctional bradycardia 08/25/2018   LFT elevation    Long term current use of amiodarone 10/17/2016   Morbid (severe) obesity due to excess calories ([place]) 04/29/2017   Morbid obesity ([place])    On home oxygen therapy    "2L; 24/7" (07/06/2018)   PAF (paroxysmal atrial fibrillation) (HCC)    Peripheral neuropathy    Pneumonia 12/2016   PONV  (postoperative nausea and vomiting)    Renal cyst    Renal insufficiency 10/30/2005   doppler - abn resistance consistent w/ parenchymal disease   Renovascular hypertension    Type 2 diabetes mellitus with diabetic neuropathy, with long-term current use of insulin ([place])    Vertigo    Past Surgical History:  Procedure Laterality Date   A/V FISTULAGRAM Left 03/19/2018   Procedure: A/V FISTULAGRAM;  Surgeon: Angelia Mould, MD;  Location: [place] [facility];  Service: Cardiovascular;  Laterality: Left;   A/V FISTULAGRAM N/A 12/24/2018   Procedure: A/V FISTULAGRAM - Right Arm;  Surgeon: Angelia Mould, MD;  Location: [place] [facility];  Service: Cardiovascular;  Laterality: N/A;   A/V FISTULAGRAM Right 03/29/2019   Procedure: A/V FISTULAGRAM;  Surgeon: Angelia Mould, MD;  Location: [place] [facility];  Service: Cardiovascular;  Laterality: Right;   A/V SHUNTOGRAM Left 01/19/2018   Procedure: A/V SHUNTOGRAM;  Surgeon: Serafina Mitchell, MD;  Location: [place] [facility];  Service: Cardiovascular;  Laterality: Left;   ANGIOPLASTY Left 02/12/2015   Procedure: ANGIOPLASTY;  Surgeon: Angelia Mould, MD;  Location: [facility] CATH LAB;  Service: Cardiovascular;  Laterality: Left;  AVF   AV FISTULA PLACEMENT Left 11/30/2014   Procedure: ARTERIOVENOUS (AV) FISTULA CREATION LEFT ARM;  Surgeon: Angelia Mould, MD;  Location: [place];  Service: Vascular;  Laterality: Left;   AV FISTULA PLACEMENT Left 03/20/2015   Procedure:  Inserton of Left Upper Arm Gortex Graft;  Surgeon: Angelia Mould, MD;  Location: [place];  Service: Vascular;  Laterality: Left;   AV FISTULA PLACEMENT Left 04/12/2015   Procedure: INSERTION OF LEFT ARM  ARTERIOVENOUS GORE-TEX GRAFT ;  Surgeon: Angelia Mould, MD;  Location: [place];  Service: Vascular;  Laterality: Left;   AV FISTULA PLACEMENT Right 06/08/2018   Procedure: INSERTION OF ARTERIOVENOUS (AV) GORE-TEX GRAFT ARM USING 4-7MM X  45CM GORETEX GRAFT;  Surgeon: Angelia Mould, MD;  Location: [place];  Service: Vascular;  Laterality: Right;   BACK SURGERY  ~1980   CATARACT EXTRACTION Right    CHOLECYSTECTOMY     ESOPHAGOGASTRODUODENOSCOPY N/A 10/24/2014   Procedure: ESOPHAGOGASTRODUODENOSCOPY (EGD);  Surgeon: Jerene Bears, MD;  Location: [facility] ENDOSCOPY;  Service: Endoscopy;  Laterality: N/A;   EXCHANGE OF A DIALYSIS CATHETER Left 11/30/2014   Procedure: EXCHANGE OF A DIALYSIS CATHETER, LEFT INTERNAL JUGULAR;  Surgeon: Angelia Mould, MD;  Location: [place];  Service: Vascular;  Laterality: Left;   EYE SURGERY Left    transplant   FISTULOGRAM N/A 02/12/2015  Procedure: FISTULOGRAM;  Surgeon: Angelia Mould, MD;  Location: [facility] CATH LAB;  Service: Cardiovascular;  Laterality: N/A;   HEMORRHOID SURGERY     IR AV DIALY SHUNT INTRO NEEDLE/INTRACATH INITIAL W/PTA/IMG RIGHT Right 08/25/2018   IR AV DIALY SHUNT INTRO NEEDLE/INTRACATH INITIAL W/PTA/IMG RIGHT Right 11/17/2018   IR US GUIDE VASC ACCESS RIGHT  08/25/2018   LIGATION ARTERIOVENOUS GORTEX GRAFT Left 08/27/2018   Procedure: LIGATION ARTERIOVENOUS GORTEX GRAFT ARM;  Surgeon: Angelia Mould, MD;  Location: [place];  Service: Vascular;  Laterality: Left;   LIGATION OF ARTERIOVENOUS  FISTULA Left 03/20/2015   Procedure: LIGATION OF ARTERIOVENOUS  FISTULA;  Surgeon: Angelia Mould, MD;  Location: [place];  Service: Vascular;  Laterality: Left;   MASTECTOMY Bilateral ~1982   bil breast reconstruction with implants   ORIF TIBIA & FIBULA FRACTURES Left 2007   also had left non displaced malleolar fracture.    PERIPHERAL VASCULAR BALLOON ANGIOPLASTY Left 01/19/2018   Procedure: PERIPHERAL VASCULAR BALLOON ANGIOPLASTY;  Surgeon: Serafina Mitchell, MD;  Location: [place] [facility];  Service: Cardiovascular;  Laterality: Left;  fistula   PERIPHERAL VASCULAR BALLOON ANGIOPLASTY Left 03/19/2018   Procedure: PERIPHERAL VASCULAR BALLOON ANGIOPLASTY;   Surgeon: Angelia Mould, MD;  Location: [place] [facility];  Service: Cardiovascular;  Laterality: Left;  UPPER ARM FISTULA   PERIPHERAL VASCULAR BALLOON ANGIOPLASTY Right 12/24/2018   Procedure: PERIPHERAL VASCULAR BALLOON ANGIOPLASTY;  Surgeon: Angelia Mould, MD;  Location: [place] [facility];  Service: Cardiovascular;  Laterality: Right;  arm fistula   PERIPHERAL VASCULAR BALLOON ANGIOPLASTY  03/29/2019   Procedure: PERIPHERAL VASCULAR BALLOON ANGIOPLASTY;  Surgeon: Angelia Mould, MD;  Location: [place] [facility];  Service: Cardiovascular;;   REMOVAL OF GRAFT Left 04/12/2015   Procedure: REMOVAL OF LEFT ARM ARTERIOVENOUS GORE-TEX GRAFT;  Surgeon: Angelia Mould, MD;  Location: [facility] OR;  Service: Vascular;  Laterality: Left;     Current Meds  Medication Sig   acetaminophen (TYLENOL) 325 MG tablet Take 650 mg by mouth every 6 (six) hours as needed for fever.    allopurinol (ZYLOPRIM) 100 MG tablet Take 100 mg by mouth daily. (0900)   aspirin 81 MG chewable tablet Chew 81 mg by mouth daily. (0900)   B Complex-C-Folic Acid (NEPHRO VITAMINS) 0.8 MG TABS Take 1 tablet by mouth daily. (0900)   Brinzolamide-Brimonidine (SIMBRINZA) 1-0.2 % SUSP Place 1 drop into both eyes 2 (two) times daily. (0900 & 2100)   calcium acetate (PHOSLO) 667 MG capsule Take 1,334 mg by mouth 3 (three) times daily with meals.   diazepam (VALIUM) 5 MG tablet Take 1 tablet (5 mg total) by mouth at bedtime. (Patient taking differently: Take 5 mg by mouth at bedtime. (2100))   diclofenac sodium (VOLTAREN) 1 % GEL Apply 4 g topically 4 (four) times daily. Applied to right knee   DIMETHICONE, TOPICAL, 5 % CREA Apply 1 application topically See admin instructions. Place a nickel-sized amount onto buttock(s) area 2 times a day for barrier protection   Lidocaine (ASPERCREME LIDOCAINE) 4 % PTCH Place 1 patch onto the skin daily. Applied to right knee (on at 0800 & off at 2000)   loperamide  (IMODIUM A-D) 2 MG tablet Take 2 tablets (4 mg total) by mouth every 8 (eight) hours as needed for diarrhea or loose stools (for diarrhea).   loratadine (CLARITIN) 10 MG tablet Take 10 mg by mouth daily. (0900)   meclizine (ANTIVERT) 25 MG tablet Take 25 mg by  mouth every 8 (eight) hours as needed for dizziness (vertigo).    Melatonin 3 MG TABS Take 6 mg by mouth at bedtime.    metoprolol tartrate (LOPRESSOR) 50 MG tablet Take 1 tablet (50 mg total) by mouth 2 (two) times daily. Per ER dosing   midodrine (PROAMATINE) 10 MG tablet Take 10 mg by mouth See admin instructions. Take 10 mg by mouth WITH MORNING MEDS AT FACILITY on Mon/Wed/Fri- dialysis days Take 10 mg by mouth at dialysis on Mon/Wed/Fri   midodrine (PROAMATINE) 2.5 MG tablet Take 2.5 mg by mouth See admin instructions. 3 times daily on Sun Tues Thurs and Sat , and 2 times daily on Mon Wed and Fri after Dialysis   OXYGEN Inhale 2 L into the lungs continuous.    promethazine (PHENERGAN) 25 MG tablet Take 1 tablet (25 mg total) by mouth every 8 (eight) hours as needed for nausea or vomiting.   traZODone (DESYREL) 50 MG tablet Take 25 mg by mouth at bedtime. (2100)     Allergies:   Erythromycin, Codeine, Erythromycin base, Penicillin g, Penicillins, and Zantac [ranitidine hcl]   Social History   Tobacco Use   Smoking status: Never Smoker   Smokeless tobacco: Never Used  Substance Use Topics   Alcohol use: No   Drug use: No     Family Hx: The patient's family history includes Diabetes in her mother; Heart attack in her father; Heart disease in her brother and mother; Lung disease in her brother; Multiple myeloma in her sister; Non-Hodgkin's lymphoma in her mother; Parkinson's disease in her brother; Stroke in her father.  ROS:   Please see the history of present illness.     All other systems reviewed and are negative.   Prior CV studies:   The following studies were reviewed today:  Notes from emergency room  department visit on May 25  Labs/Other Tests and Data Reviewed:    EKG:  An ECG dated 05/16/2019 was personally reviewed today and demonstrated:  Atrial fibrillation with controlled ventricular response and occasional wide-complex beats most likely aberrancy, possible PVCs  Recent Labs: 08/23/2018: TSH 1.417 03/13/2019: ALT 27 05/16/2019: BUN 9; Creatinine, Ser 2.22; Hemoglobin 11.8; Magnesium 1.8; Platelets 152; Potassium 3.6; Sodium 137   Recent Lipid Panel Lab Results  Component Value Date/Time   CHOL 157 08/24/2018 04:23 AM   TRIG 147 08/24/2018 04:23 AM   HDL 32 (L) 08/24/2018 04:23 AM   CHOLHDL 4.9 08/24/2018 04:23 AM   LDLCALC 96 08/24/2018 04:23 AM    Wt Readings from Last 3 Encounters:  06/02/19 136 lb (61.7 kg)  03/14/19 147 lb 11.3 oz (67 kg)  03/01/19 149 lb (67.6 kg)     Objective:    Vital Signs:  BP (!) 170/60 Comment: Last night reading   Wt 136 lb (61.7 kg)    SpO2 99%    BMI 22.63 kg/m    VITAL SIGNS:  reviewed Unable to examine  ASSESSMENT & PLAN:    1. AFib: recent occurrence was first time in several years.  She should not be on any antiarrhythmic drugs since this would increase the likelihood of symptomatic bradycardia.  Seems to be tolerating low-dose metoprolol. Not a good candidate for anticoagulation.  She is on aspirin. 2. SSS:  With periods of symptomatic bradycardia in July and early September. Amiodarone was stopped most 6 months ago and should now be out of her system.  Evaluated by 2 different EP specialists who have deemed her a poor  candidate for device therapy.  Unfortunately she had recurrent atrial fibrillation with RVR, possibly related to acute coronavirus infection.  Lengthily no new episodes of symptomatic bradycardia. 3. Hypotension: on midodrine as needed (always on HD days).    No recent syncope or symptomatic hypotension. 4. ESRD: On dialysis MWF. Occluded central L arm venous system and ligated L arm AVF. Dialysis via R arm.  Recently  required venoplasty of 2 sites the brachial vein attached to her AV graft.  So far has not had symptomatic hypotension during dialysis since restarting beta-blockers. 5. Ao atherosclerosis: Has evidence of peripheral arterial disease as well as coronary calcification on chest CT, thankfully asymptomatic(except for chest pain in July, during arrhythmia). Had a normal nuclear stress test in 2015. LDL<100without meds in September 2019.  She has lost substantial weight and I suspect that her cholesterol level is even lower now.  On aspirin. 6. Hypoalbuminemia: She continues to lose weight and is probably malnourished.  Albumin was only 2.8 in March  COVID-19 Education: The signs and symptoms of COVID-19 were discussed with the patient and how to seek care for testing (follow up with PCP or arrange E-visit).  The importance of social distancing was discussed today.  Time:   Today, I have spent 22 minutes with the patient with telehealth technology discussing the above problems.     Medication Adjustments/Labs and Tests Ordered: Current medicines are reviewed at length with the patient today.  Concerns regarding medicines are outlined above.   Tests Ordered: No orders of the defined types were placed in this encounter.   Medication Changes: No orders of the defined types were placed in this encounter.   Disposition:  Follow up 6 months  Signed, Sanda Klein, MD  06/02/2019 8:05 AM    [place]

## 2019-06-02 NOTE — Telephone Encounter (Signed)
Left voicemail for patient to call office back to [place] before 8:00 virtual visit with Dr. Sallyanne Kuster.

## 2019-06-03 DIAGNOSIS — E611 Iron deficiency: Secondary | ICD-10-CM | POA: Diagnosis not present

## 2019-06-03 DIAGNOSIS — E1122 Type 2 diabetes mellitus with diabetic chronic kidney disease: Secondary | ICD-10-CM | POA: Diagnosis not present

## 2019-06-03 DIAGNOSIS — Z7401 Bed confinement status: Secondary | ICD-10-CM | POA: Diagnosis not present

## 2019-06-03 DIAGNOSIS — R5381 Other malaise: Secondary | ICD-10-CM | POA: Diagnosis not present

## 2019-06-03 DIAGNOSIS — N186 End stage renal disease: Secondary | ICD-10-CM | POA: Diagnosis not present

## 2019-06-03 DIAGNOSIS — D509 Iron deficiency anemia, unspecified: Secondary | ICD-10-CM | POA: Diagnosis not present

## 2019-06-03 DIAGNOSIS — D631 Anemia in chronic kidney disease: Secondary | ICD-10-CM | POA: Diagnosis not present

## 2019-06-03 DIAGNOSIS — E211 Secondary hyperparathyroidism, not elsewhere classified: Secondary | ICD-10-CM | POA: Diagnosis not present

## 2019-06-03 DIAGNOSIS — M2550 Pain in unspecified joint: Secondary | ICD-10-CM | POA: Diagnosis not present

## 2019-06-06 DIAGNOSIS — E211 Secondary hyperparathyroidism, not elsewhere classified: Secondary | ICD-10-CM | POA: Diagnosis not present

## 2019-06-06 DIAGNOSIS — E1122 Type 2 diabetes mellitus with diabetic chronic kidney disease: Secondary | ICD-10-CM | POA: Diagnosis not present

## 2019-06-06 DIAGNOSIS — N186 End stage renal disease: Secondary | ICD-10-CM | POA: Diagnosis not present

## 2019-06-06 DIAGNOSIS — D509 Iron deficiency anemia, unspecified: Secondary | ICD-10-CM | POA: Diagnosis not present

## 2019-06-06 DIAGNOSIS — R5381 Other malaise: Secondary | ICD-10-CM | POA: Diagnosis not present

## 2019-06-06 DIAGNOSIS — M2550 Pain in unspecified joint: Secondary | ICD-10-CM | POA: Diagnosis not present

## 2019-06-06 DIAGNOSIS — E611 Iron deficiency: Secondary | ICD-10-CM | POA: Diagnosis not present

## 2019-06-06 DIAGNOSIS — Z7401 Bed confinement status: Secondary | ICD-10-CM | POA: Diagnosis not present

## 2019-06-06 DIAGNOSIS — D631 Anemia in chronic kidney disease: Secondary | ICD-10-CM | POA: Diagnosis not present

## 2019-06-08 DIAGNOSIS — D509 Iron deficiency anemia, unspecified: Secondary | ICD-10-CM | POA: Diagnosis not present

## 2019-06-08 DIAGNOSIS — Z7401 Bed confinement status: Secondary | ICD-10-CM | POA: Diagnosis not present

## 2019-06-08 DIAGNOSIS — E1122 Type 2 diabetes mellitus with diabetic chronic kidney disease: Secondary | ICD-10-CM | POA: Diagnosis not present

## 2019-06-08 DIAGNOSIS — I10 Essential (primary) hypertension: Secondary | ICD-10-CM | POA: Diagnosis not present

## 2019-06-08 DIAGNOSIS — N186 End stage renal disease: Secondary | ICD-10-CM | POA: Diagnosis not present

## 2019-06-08 DIAGNOSIS — E611 Iron deficiency: Secondary | ICD-10-CM | POA: Diagnosis not present

## 2019-06-08 DIAGNOSIS — U071 COVID-19: Secondary | ICD-10-CM | POA: Diagnosis not present

## 2019-06-08 DIAGNOSIS — E211 Secondary hyperparathyroidism, not elsewhere classified: Secondary | ICD-10-CM | POA: Diagnosis not present

## 2019-06-08 DIAGNOSIS — D631 Anemia in chronic kidney disease: Secondary | ICD-10-CM | POA: Diagnosis not present

## 2019-06-08 DIAGNOSIS — M2550 Pain in unspecified joint: Secondary | ICD-10-CM | POA: Diagnosis not present

## 2019-06-09 DIAGNOSIS — B349 Viral infection, unspecified: Secondary | ICD-10-CM | POA: Diagnosis not present

## 2019-06-10 DIAGNOSIS — D631 Anemia in chronic kidney disease: Secondary | ICD-10-CM | POA: Diagnosis not present

## 2019-06-10 DIAGNOSIS — M2550 Pain in unspecified joint: Secondary | ICD-10-CM | POA: Diagnosis not present

## 2019-06-10 DIAGNOSIS — E611 Iron deficiency: Secondary | ICD-10-CM | POA: Diagnosis not present

## 2019-06-10 DIAGNOSIS — E1122 Type 2 diabetes mellitus with diabetic chronic kidney disease: Secondary | ICD-10-CM | POA: Diagnosis not present

## 2019-06-10 DIAGNOSIS — D509 Iron deficiency anemia, unspecified: Secondary | ICD-10-CM | POA: Diagnosis not present

## 2019-06-10 DIAGNOSIS — U071 COVID-19: Secondary | ICD-10-CM | POA: Diagnosis not present

## 2019-06-10 DIAGNOSIS — N186 End stage renal disease: Secondary | ICD-10-CM | POA: Diagnosis not present

## 2019-06-10 DIAGNOSIS — R5381 Other malaise: Secondary | ICD-10-CM | POA: Diagnosis not present

## 2019-06-10 DIAGNOSIS — Z7401 Bed confinement status: Secondary | ICD-10-CM | POA: Diagnosis not present

## 2019-06-10 DIAGNOSIS — E211 Secondary hyperparathyroidism, not elsewhere classified: Secondary | ICD-10-CM | POA: Diagnosis not present

## 2019-06-13 DIAGNOSIS — N186 End stage renal disease: Secondary | ICD-10-CM | POA: Diagnosis not present

## 2019-06-13 DIAGNOSIS — E1122 Type 2 diabetes mellitus with diabetic chronic kidney disease: Secondary | ICD-10-CM | POA: Diagnosis not present

## 2019-06-13 DIAGNOSIS — E611 Iron deficiency: Secondary | ICD-10-CM | POA: Diagnosis not present

## 2019-06-13 DIAGNOSIS — E211 Secondary hyperparathyroidism, not elsewhere classified: Secondary | ICD-10-CM | POA: Diagnosis not present

## 2019-06-13 DIAGNOSIS — U071 COVID-19: Secondary | ICD-10-CM | POA: Diagnosis not present

## 2019-06-13 DIAGNOSIS — D631 Anemia in chronic kidney disease: Secondary | ICD-10-CM | POA: Diagnosis not present

## 2019-06-13 DIAGNOSIS — D509 Iron deficiency anemia, unspecified: Secondary | ICD-10-CM | POA: Diagnosis not present

## 2019-06-14 DIAGNOSIS — B342 Coronavirus infection, unspecified: Secondary | ICD-10-CM | POA: Diagnosis not present

## 2019-06-15 DIAGNOSIS — R531 Weakness: Secondary | ICD-10-CM | POA: Diagnosis not present

## 2019-06-15 DIAGNOSIS — N186 End stage renal disease: Secondary | ICD-10-CM | POA: Diagnosis not present

## 2019-06-15 DIAGNOSIS — R5381 Other malaise: Secondary | ICD-10-CM | POA: Diagnosis not present

## 2019-06-15 DIAGNOSIS — Z7401 Bed confinement status: Secondary | ICD-10-CM | POA: Diagnosis not present

## 2019-06-15 DIAGNOSIS — U071 COVID-19: Secondary | ICD-10-CM | POA: Diagnosis not present

## 2019-06-15 DIAGNOSIS — E1122 Type 2 diabetes mellitus with diabetic chronic kidney disease: Secondary | ICD-10-CM | POA: Diagnosis not present

## 2019-06-15 DIAGNOSIS — R29898 Other symptoms and signs involving the musculoskeletal system: Secondary | ICD-10-CM | POA: Diagnosis not present

## 2019-06-15 DIAGNOSIS — D509 Iron deficiency anemia, unspecified: Secondary | ICD-10-CM | POA: Diagnosis not present

## 2019-06-15 DIAGNOSIS — E211 Secondary hyperparathyroidism, not elsewhere classified: Secondary | ICD-10-CM | POA: Diagnosis not present

## 2019-06-15 DIAGNOSIS — E611 Iron deficiency: Secondary | ICD-10-CM | POA: Diagnosis not present

## 2019-06-15 DIAGNOSIS — M2550 Pain in unspecified joint: Secondary | ICD-10-CM | POA: Diagnosis not present

## 2019-06-15 DIAGNOSIS — D631 Anemia in chronic kidney disease: Secondary | ICD-10-CM | POA: Diagnosis not present

## 2019-06-17 DIAGNOSIS — E211 Secondary hyperparathyroidism, not elsewhere classified: Secondary | ICD-10-CM | POA: Diagnosis not present

## 2019-06-17 DIAGNOSIS — E1122 Type 2 diabetes mellitus with diabetic chronic kidney disease: Secondary | ICD-10-CM | POA: Diagnosis not present

## 2019-06-17 DIAGNOSIS — N186 End stage renal disease: Secondary | ICD-10-CM | POA: Diagnosis not present

## 2019-06-17 DIAGNOSIS — M2550 Pain in unspecified joint: Secondary | ICD-10-CM | POA: Diagnosis not present

## 2019-06-17 DIAGNOSIS — R0902 Hypoxemia: Secondary | ICD-10-CM | POA: Diagnosis not present

## 2019-06-17 DIAGNOSIS — D509 Iron deficiency anemia, unspecified: Secondary | ICD-10-CM | POA: Diagnosis not present

## 2019-06-17 DIAGNOSIS — Z7401 Bed confinement status: Secondary | ICD-10-CM | POA: Diagnosis not present

## 2019-06-17 DIAGNOSIS — D631 Anemia in chronic kidney disease: Secondary | ICD-10-CM | POA: Diagnosis not present

## 2019-06-17 DIAGNOSIS — E611 Iron deficiency: Secondary | ICD-10-CM | POA: Diagnosis not present

## 2019-06-20 DIAGNOSIS — E211 Secondary hyperparathyroidism, not elsewhere classified: Secondary | ICD-10-CM | POA: Diagnosis not present

## 2019-06-20 DIAGNOSIS — Z7401 Bed confinement status: Secondary | ICD-10-CM | POA: Diagnosis not present

## 2019-06-20 DIAGNOSIS — R5381 Other malaise: Secondary | ICD-10-CM | POA: Diagnosis not present

## 2019-06-20 DIAGNOSIS — N186 End stage renal disease: Secondary | ICD-10-CM | POA: Diagnosis not present

## 2019-06-20 DIAGNOSIS — D509 Iron deficiency anemia, unspecified: Secondary | ICD-10-CM | POA: Diagnosis not present

## 2019-06-20 DIAGNOSIS — D631 Anemia in chronic kidney disease: Secondary | ICD-10-CM | POA: Diagnosis not present

## 2019-06-20 DIAGNOSIS — E1122 Type 2 diabetes mellitus with diabetic chronic kidney disease: Secondary | ICD-10-CM | POA: Diagnosis not present

## 2019-06-20 DIAGNOSIS — U071 COVID-19: Secondary | ICD-10-CM | POA: Diagnosis not present

## 2019-06-20 DIAGNOSIS — E611 Iron deficiency: Secondary | ICD-10-CM | POA: Diagnosis not present

## 2019-06-20 DIAGNOSIS — M2550 Pain in unspecified joint: Secondary | ICD-10-CM | POA: Diagnosis not present

## 2019-06-21 DIAGNOSIS — U071 COVID-19: Secondary | ICD-10-CM | POA: Diagnosis not present

## 2019-06-21 DIAGNOSIS — J329 Chronic sinusitis, unspecified: Secondary | ICD-10-CM | POA: Diagnosis not present

## 2019-06-21 DIAGNOSIS — M150 Primary generalized (osteo)arthritis: Secondary | ICD-10-CM | POA: Diagnosis not present

## 2019-06-21 DIAGNOSIS — K529 Noninfective gastroenteritis and colitis, unspecified: Secondary | ICD-10-CM | POA: Diagnosis not present

## 2019-06-22 DIAGNOSIS — Z7401 Bed confinement status: Secondary | ICD-10-CM | POA: Diagnosis not present

## 2019-06-22 DIAGNOSIS — M2550 Pain in unspecified joint: Secondary | ICD-10-CM | POA: Diagnosis not present

## 2019-06-22 DIAGNOSIS — I10 Essential (primary) hypertension: Secondary | ICD-10-CM | POA: Diagnosis not present

## 2019-06-22 DIAGNOSIS — E1122 Type 2 diabetes mellitus with diabetic chronic kidney disease: Secondary | ICD-10-CM | POA: Diagnosis not present

## 2019-06-22 DIAGNOSIS — Z992 Dependence on renal dialysis: Secondary | ICD-10-CM | POA: Diagnosis not present

## 2019-06-22 DIAGNOSIS — D631 Anemia in chronic kidney disease: Secondary | ICD-10-CM | POA: Diagnosis not present

## 2019-06-22 DIAGNOSIS — R0902 Hypoxemia: Secondary | ICD-10-CM | POA: Diagnosis not present

## 2019-06-22 DIAGNOSIS — E211 Secondary hyperparathyroidism, not elsewhere classified: Secondary | ICD-10-CM | POA: Diagnosis not present

## 2019-06-22 DIAGNOSIS — N186 End stage renal disease: Secondary | ICD-10-CM | POA: Diagnosis not present

## 2019-06-22 DIAGNOSIS — D509 Iron deficiency anemia, unspecified: Secondary | ICD-10-CM | POA: Diagnosis not present

## 2019-06-22 DIAGNOSIS — E611 Iron deficiency: Secondary | ICD-10-CM | POA: Diagnosis not present

## 2019-06-22 DIAGNOSIS — N2581 Secondary hyperparathyroidism of renal origin: Secondary | ICD-10-CM | POA: Diagnosis not present

## 2019-06-23 DIAGNOSIS — G44209 Tension-type headache, unspecified, not intractable: Secondary | ICD-10-CM | POA: Diagnosis not present

## 2019-06-23 DIAGNOSIS — I1311 Hypertensive heart and chronic kidney disease without heart failure, with stage 5 chronic kidney disease, or end stage renal disease: Secondary | ICD-10-CM | POA: Diagnosis not present

## 2019-06-23 DIAGNOSIS — R0981 Nasal congestion: Secondary | ICD-10-CM | POA: Diagnosis not present

## 2019-06-23 DIAGNOSIS — U071 COVID-19: Secondary | ICD-10-CM | POA: Diagnosis not present

## 2019-06-24 DIAGNOSIS — M2550 Pain in unspecified joint: Secondary | ICD-10-CM | POA: Diagnosis not present

## 2019-06-24 DIAGNOSIS — E611 Iron deficiency: Secondary | ICD-10-CM | POA: Diagnosis not present

## 2019-06-24 DIAGNOSIS — I10 Essential (primary) hypertension: Secondary | ICD-10-CM | POA: Diagnosis not present

## 2019-06-24 DIAGNOSIS — D509 Iron deficiency anemia, unspecified: Secondary | ICD-10-CM | POA: Diagnosis not present

## 2019-06-24 DIAGNOSIS — E1122 Type 2 diabetes mellitus with diabetic chronic kidney disease: Secondary | ICD-10-CM | POA: Diagnosis not present

## 2019-06-24 DIAGNOSIS — E211 Secondary hyperparathyroidism, not elsewhere classified: Secondary | ICD-10-CM | POA: Diagnosis not present

## 2019-06-24 DIAGNOSIS — N2581 Secondary hyperparathyroidism of renal origin: Secondary | ICD-10-CM | POA: Diagnosis not present

## 2019-06-24 DIAGNOSIS — Z7401 Bed confinement status: Secondary | ICD-10-CM | POA: Diagnosis not present

## 2019-06-24 DIAGNOSIS — N186 End stage renal disease: Secondary | ICD-10-CM | POA: Diagnosis not present

## 2019-06-27 DIAGNOSIS — D509 Iron deficiency anemia, unspecified: Secondary | ICD-10-CM | POA: Diagnosis not present

## 2019-06-27 DIAGNOSIS — N186 End stage renal disease: Secondary | ICD-10-CM | POA: Diagnosis not present

## 2019-06-27 DIAGNOSIS — E211 Secondary hyperparathyroidism, not elsewhere classified: Secondary | ICD-10-CM | POA: Diagnosis not present

## 2019-06-27 DIAGNOSIS — R5381 Other malaise: Secondary | ICD-10-CM | POA: Diagnosis not present

## 2019-06-27 DIAGNOSIS — E611 Iron deficiency: Secondary | ICD-10-CM | POA: Diagnosis not present

## 2019-06-27 DIAGNOSIS — E1122 Type 2 diabetes mellitus with diabetic chronic kidney disease: Secondary | ICD-10-CM | POA: Diagnosis not present

## 2019-06-27 DIAGNOSIS — I10 Essential (primary) hypertension: Secondary | ICD-10-CM | POA: Diagnosis not present

## 2019-06-27 DIAGNOSIS — M2550 Pain in unspecified joint: Secondary | ICD-10-CM | POA: Diagnosis not present

## 2019-06-27 DIAGNOSIS — N2581 Secondary hyperparathyroidism of renal origin: Secondary | ICD-10-CM | POA: Diagnosis not present

## 2019-06-27 DIAGNOSIS — U071 COVID-19: Secondary | ICD-10-CM | POA: Diagnosis not present

## 2019-06-27 DIAGNOSIS — Z7401 Bed confinement status: Secondary | ICD-10-CM | POA: Diagnosis not present

## 2019-06-29 DIAGNOSIS — M2550 Pain in unspecified joint: Secondary | ICD-10-CM | POA: Diagnosis not present

## 2019-06-29 DIAGNOSIS — R402411 Glasgow coma scale score 13-15, in the field [EMT or ambulance]: Secondary | ICD-10-CM | POA: Diagnosis not present

## 2019-06-29 DIAGNOSIS — B349 Viral infection, unspecified: Secondary | ICD-10-CM | POA: Diagnosis not present

## 2019-06-29 DIAGNOSIS — R195 Other fecal abnormalities: Secondary | ICD-10-CM | POA: Diagnosis not present

## 2019-06-29 DIAGNOSIS — Z7401 Bed confinement status: Secondary | ICD-10-CM | POA: Diagnosis not present

## 2019-06-29 DIAGNOSIS — R110 Nausea: Secondary | ICD-10-CM | POA: Diagnosis not present

## 2019-06-29 DIAGNOSIS — E611 Iron deficiency: Secondary | ICD-10-CM | POA: Diagnosis not present

## 2019-06-29 DIAGNOSIS — U071 COVID-19: Secondary | ICD-10-CM | POA: Diagnosis not present

## 2019-06-29 DIAGNOSIS — D509 Iron deficiency anemia, unspecified: Secondary | ICD-10-CM | POA: Diagnosis not present

## 2019-06-29 DIAGNOSIS — N186 End stage renal disease: Secondary | ICD-10-CM | POA: Diagnosis not present

## 2019-06-29 DIAGNOSIS — E1122 Type 2 diabetes mellitus with diabetic chronic kidney disease: Secondary | ICD-10-CM | POA: Diagnosis not present

## 2019-06-29 DIAGNOSIS — E211 Secondary hyperparathyroidism, not elsewhere classified: Secondary | ICD-10-CM | POA: Diagnosis not present

## 2019-06-29 DIAGNOSIS — R410 Disorientation, unspecified: Secondary | ICD-10-CM | POA: Diagnosis not present

## 2019-06-29 DIAGNOSIS — N2581 Secondary hyperparathyroidism of renal origin: Secondary | ICD-10-CM | POA: Diagnosis not present

## 2019-06-29 DIAGNOSIS — E860 Dehydration: Secondary | ICD-10-CM | POA: Diagnosis not present

## 2019-06-29 DIAGNOSIS — R300 Dysuria: Secondary | ICD-10-CM | POA: Diagnosis not present

## 2019-07-01 DIAGNOSIS — E1122 Type 2 diabetes mellitus with diabetic chronic kidney disease: Secondary | ICD-10-CM | POA: Diagnosis not present

## 2019-07-01 DIAGNOSIS — N186 End stage renal disease: Secondary | ICD-10-CM | POA: Diagnosis not present

## 2019-07-01 DIAGNOSIS — D509 Iron deficiency anemia, unspecified: Secondary | ICD-10-CM | POA: Diagnosis not present

## 2019-07-01 DIAGNOSIS — N2581 Secondary hyperparathyroidism of renal origin: Secondary | ICD-10-CM | POA: Diagnosis not present

## 2019-07-01 DIAGNOSIS — E611 Iron deficiency: Secondary | ICD-10-CM | POA: Diagnosis not present

## 2019-07-01 DIAGNOSIS — E211 Secondary hyperparathyroidism, not elsewhere classified: Secondary | ICD-10-CM | POA: Diagnosis not present

## 2019-07-04 DIAGNOSIS — E611 Iron deficiency: Secondary | ICD-10-CM | POA: Diagnosis not present

## 2019-07-04 DIAGNOSIS — N186 End stage renal disease: Secondary | ICD-10-CM | POA: Diagnosis not present

## 2019-07-04 DIAGNOSIS — D509 Iron deficiency anemia, unspecified: Secondary | ICD-10-CM | POA: Diagnosis not present

## 2019-07-04 DIAGNOSIS — N2581 Secondary hyperparathyroidism of renal origin: Secondary | ICD-10-CM | POA: Diagnosis not present

## 2019-07-04 DIAGNOSIS — E1122 Type 2 diabetes mellitus with diabetic chronic kidney disease: Secondary | ICD-10-CM | POA: Diagnosis not present

## 2019-07-04 DIAGNOSIS — E211 Secondary hyperparathyroidism, not elsewhere classified: Secondary | ICD-10-CM | POA: Diagnosis not present

## 2019-07-05 DIAGNOSIS — R399 Unspecified symptoms and signs involving the genitourinary system: Secondary | ICD-10-CM | POA: Diagnosis not present

## 2019-07-06 DIAGNOSIS — E1122 Type 2 diabetes mellitus with diabetic chronic kidney disease: Secondary | ICD-10-CM | POA: Diagnosis not present

## 2019-07-06 DIAGNOSIS — N2581 Secondary hyperparathyroidism of renal origin: Secondary | ICD-10-CM | POA: Diagnosis not present

## 2019-07-06 DIAGNOSIS — D509 Iron deficiency anemia, unspecified: Secondary | ICD-10-CM | POA: Diagnosis not present

## 2019-07-06 DIAGNOSIS — E611 Iron deficiency: Secondary | ICD-10-CM | POA: Diagnosis not present

## 2019-07-06 DIAGNOSIS — N186 End stage renal disease: Secondary | ICD-10-CM | POA: Diagnosis not present

## 2019-07-06 DIAGNOSIS — E211 Secondary hyperparathyroidism, not elsewhere classified: Secondary | ICD-10-CM | POA: Diagnosis not present

## 2019-07-08 DIAGNOSIS — E611 Iron deficiency: Secondary | ICD-10-CM | POA: Diagnosis not present

## 2019-07-08 DIAGNOSIS — D509 Iron deficiency anemia, unspecified: Secondary | ICD-10-CM | POA: Diagnosis not present

## 2019-07-08 DIAGNOSIS — E211 Secondary hyperparathyroidism, not elsewhere classified: Secondary | ICD-10-CM | POA: Diagnosis not present

## 2019-07-08 DIAGNOSIS — E1122 Type 2 diabetes mellitus with diabetic chronic kidney disease: Secondary | ICD-10-CM | POA: Diagnosis not present

## 2019-07-08 DIAGNOSIS — N2581 Secondary hyperparathyroidism of renal origin: Secondary | ICD-10-CM | POA: Diagnosis not present

## 2019-07-08 DIAGNOSIS — N186 End stage renal disease: Secondary | ICD-10-CM | POA: Diagnosis not present

## 2019-07-11 DIAGNOSIS — E1122 Type 2 diabetes mellitus with diabetic chronic kidney disease: Secondary | ICD-10-CM | POA: Diagnosis not present

## 2019-07-11 DIAGNOSIS — E611 Iron deficiency: Secondary | ICD-10-CM | POA: Diagnosis not present

## 2019-07-11 DIAGNOSIS — N2581 Secondary hyperparathyroidism of renal origin: Secondary | ICD-10-CM | POA: Diagnosis not present

## 2019-07-11 DIAGNOSIS — E211 Secondary hyperparathyroidism, not elsewhere classified: Secondary | ICD-10-CM | POA: Diagnosis not present

## 2019-07-11 DIAGNOSIS — N186 End stage renal disease: Secondary | ICD-10-CM | POA: Diagnosis not present

## 2019-07-11 DIAGNOSIS — D509 Iron deficiency anemia, unspecified: Secondary | ICD-10-CM | POA: Diagnosis not present

## 2019-07-13 DIAGNOSIS — D509 Iron deficiency anemia, unspecified: Secondary | ICD-10-CM | POA: Diagnosis not present

## 2019-07-13 DIAGNOSIS — E611 Iron deficiency: Secondary | ICD-10-CM | POA: Diagnosis not present

## 2019-07-13 DIAGNOSIS — E211 Secondary hyperparathyroidism, not elsewhere classified: Secondary | ICD-10-CM | POA: Diagnosis not present

## 2019-07-13 DIAGNOSIS — N2581 Secondary hyperparathyroidism of renal origin: Secondary | ICD-10-CM | POA: Diagnosis not present

## 2019-07-13 DIAGNOSIS — E1122 Type 2 diabetes mellitus with diabetic chronic kidney disease: Secondary | ICD-10-CM | POA: Diagnosis not present

## 2019-07-13 DIAGNOSIS — N186 End stage renal disease: Secondary | ICD-10-CM | POA: Diagnosis not present

## 2019-07-14 DIAGNOSIS — N186 End stage renal disease: Secondary | ICD-10-CM | POA: Diagnosis not present

## 2019-07-14 DIAGNOSIS — I951 Orthostatic hypotension: Secondary | ICD-10-CM | POA: Diagnosis not present

## 2019-07-14 DIAGNOSIS — E790 Hyperuricemia without signs of inflammatory arthritis and tophaceous disease: Secondary | ICD-10-CM | POA: Diagnosis not present

## 2019-07-14 DIAGNOSIS — I480 Paroxysmal atrial fibrillation: Secondary | ICD-10-CM | POA: Diagnosis not present

## 2019-07-14 DIAGNOSIS — G478 Other sleep disorders: Secondary | ICD-10-CM | POA: Diagnosis not present

## 2019-07-14 DIAGNOSIS — U071 COVID-19: Secondary | ICD-10-CM | POA: Diagnosis not present

## 2019-07-14 DIAGNOSIS — J9611 Chronic respiratory failure with hypoxia: Secondary | ICD-10-CM | POA: Diagnosis not present

## 2019-07-14 DIAGNOSIS — Z992 Dependence on renal dialysis: Secondary | ICD-10-CM | POA: Diagnosis not present

## 2019-07-14 DIAGNOSIS — I7389 Other specified peripheral vascular diseases: Secondary | ICD-10-CM | POA: Diagnosis not present

## 2019-07-15 DIAGNOSIS — D509 Iron deficiency anemia, unspecified: Secondary | ICD-10-CM | POA: Diagnosis not present

## 2019-07-15 DIAGNOSIS — N2581 Secondary hyperparathyroidism of renal origin: Secondary | ICD-10-CM | POA: Diagnosis not present

## 2019-07-15 DIAGNOSIS — E211 Secondary hyperparathyroidism, not elsewhere classified: Secondary | ICD-10-CM | POA: Diagnosis not present

## 2019-07-15 DIAGNOSIS — N186 End stage renal disease: Secondary | ICD-10-CM | POA: Diagnosis not present

## 2019-07-15 DIAGNOSIS — E1122 Type 2 diabetes mellitus with diabetic chronic kidney disease: Secondary | ICD-10-CM | POA: Diagnosis not present

## 2019-07-15 DIAGNOSIS — E611 Iron deficiency: Secondary | ICD-10-CM | POA: Diagnosis not present

## 2019-07-18 DIAGNOSIS — E611 Iron deficiency: Secondary | ICD-10-CM | POA: Diagnosis not present

## 2019-07-18 DIAGNOSIS — D509 Iron deficiency anemia, unspecified: Secondary | ICD-10-CM | POA: Diagnosis not present

## 2019-07-18 DIAGNOSIS — E1122 Type 2 diabetes mellitus with diabetic chronic kidney disease: Secondary | ICD-10-CM | POA: Diagnosis not present

## 2019-07-18 DIAGNOSIS — E211 Secondary hyperparathyroidism, not elsewhere classified: Secondary | ICD-10-CM | POA: Diagnosis not present

## 2019-07-18 DIAGNOSIS — N186 End stage renal disease: Secondary | ICD-10-CM | POA: Diagnosis not present

## 2019-07-18 DIAGNOSIS — N2581 Secondary hyperparathyroidism of renal origin: Secondary | ICD-10-CM | POA: Diagnosis not present

## 2019-07-20 DIAGNOSIS — E211 Secondary hyperparathyroidism, not elsewhere classified: Secondary | ICD-10-CM | POA: Diagnosis not present

## 2019-07-20 DIAGNOSIS — N186 End stage renal disease: Secondary | ICD-10-CM | POA: Diagnosis not present

## 2019-07-20 DIAGNOSIS — E1122 Type 2 diabetes mellitus with diabetic chronic kidney disease: Secondary | ICD-10-CM | POA: Diagnosis not present

## 2019-07-20 DIAGNOSIS — D509 Iron deficiency anemia, unspecified: Secondary | ICD-10-CM | POA: Diagnosis not present

## 2019-07-20 DIAGNOSIS — E611 Iron deficiency: Secondary | ICD-10-CM | POA: Diagnosis not present

## 2019-07-20 DIAGNOSIS — N2581 Secondary hyperparathyroidism of renal origin: Secondary | ICD-10-CM | POA: Diagnosis not present

## 2019-07-22 DIAGNOSIS — E611 Iron deficiency: Secondary | ICD-10-CM | POA: Diagnosis not present

## 2019-07-22 DIAGNOSIS — N2581 Secondary hyperparathyroidism of renal origin: Secondary | ICD-10-CM | POA: Diagnosis not present

## 2019-07-22 DIAGNOSIS — E1122 Type 2 diabetes mellitus with diabetic chronic kidney disease: Secondary | ICD-10-CM | POA: Diagnosis not present

## 2019-07-22 DIAGNOSIS — D509 Iron deficiency anemia, unspecified: Secondary | ICD-10-CM | POA: Diagnosis not present

## 2019-07-22 DIAGNOSIS — E211 Secondary hyperparathyroidism, not elsewhere classified: Secondary | ICD-10-CM | POA: Diagnosis not present

## 2019-07-22 DIAGNOSIS — N186 End stage renal disease: Secondary | ICD-10-CM | POA: Diagnosis not present

## 2019-07-22 DIAGNOSIS — R399 Unspecified symptoms and signs involving the genitourinary system: Secondary | ICD-10-CM | POA: Diagnosis not present

## 2019-07-22 DIAGNOSIS — R300 Dysuria: Secondary | ICD-10-CM | POA: Diagnosis not present

## 2019-07-23 DIAGNOSIS — N186 End stage renal disease: Secondary | ICD-10-CM | POA: Diagnosis not present

## 2019-07-23 DIAGNOSIS — Z992 Dependence on renal dialysis: Secondary | ICD-10-CM | POA: Diagnosis not present

## 2019-07-23 DIAGNOSIS — E1122 Type 2 diabetes mellitus with diabetic chronic kidney disease: Secondary | ICD-10-CM | POA: Diagnosis not present

## 2019-07-25 ENCOUNTER — Other Ambulatory Visit: Payer: Self-pay

## 2019-07-25 DIAGNOSIS — D631 Anemia in chronic kidney disease: Secondary | ICD-10-CM | POA: Diagnosis not present

## 2019-07-25 DIAGNOSIS — R300 Dysuria: Secondary | ICD-10-CM | POA: Diagnosis not present

## 2019-07-25 DIAGNOSIS — R399 Unspecified symptoms and signs involving the genitourinary system: Secondary | ICD-10-CM | POA: Diagnosis not present

## 2019-07-25 DIAGNOSIS — E1122 Type 2 diabetes mellitus with diabetic chronic kidney disease: Secondary | ICD-10-CM | POA: Diagnosis not present

## 2019-07-25 DIAGNOSIS — N2581 Secondary hyperparathyroidism of renal origin: Secondary | ICD-10-CM | POA: Diagnosis not present

## 2019-07-25 DIAGNOSIS — N186 End stage renal disease: Secondary | ICD-10-CM | POA: Diagnosis not present

## 2019-07-25 DIAGNOSIS — Z992 Dependence on renal dialysis: Secondary | ICD-10-CM | POA: Diagnosis not present

## 2019-07-25 DIAGNOSIS — E211 Secondary hyperparathyroidism, not elsewhere classified: Secondary | ICD-10-CM | POA: Diagnosis not present

## 2019-07-26 DIAGNOSIS — N390 Urinary tract infection, site not specified: Secondary | ICD-10-CM | POA: Diagnosis not present

## 2019-07-27 DIAGNOSIS — N2581 Secondary hyperparathyroidism of renal origin: Secondary | ICD-10-CM | POA: Diagnosis not present

## 2019-07-27 DIAGNOSIS — E211 Secondary hyperparathyroidism, not elsewhere classified: Secondary | ICD-10-CM | POA: Diagnosis not present

## 2019-07-27 DIAGNOSIS — E1122 Type 2 diabetes mellitus with diabetic chronic kidney disease: Secondary | ICD-10-CM | POA: Diagnosis not present

## 2019-07-27 DIAGNOSIS — N186 End stage renal disease: Secondary | ICD-10-CM | POA: Diagnosis not present

## 2019-07-27 DIAGNOSIS — D631 Anemia in chronic kidney disease: Secondary | ICD-10-CM | POA: Diagnosis not present

## 2019-07-27 DIAGNOSIS — Z992 Dependence on renal dialysis: Secondary | ICD-10-CM | POA: Diagnosis not present

## 2019-07-28 DIAGNOSIS — R0781 Pleurodynia: Secondary | ICD-10-CM | POA: Diagnosis not present

## 2019-07-28 DIAGNOSIS — Z992 Dependence on renal dialysis: Secondary | ICD-10-CM | POA: Diagnosis not present

## 2019-07-28 DIAGNOSIS — N186 End stage renal disease: Secondary | ICD-10-CM | POA: Diagnosis not present

## 2019-07-28 DIAGNOSIS — G44209 Tension-type headache, unspecified, not intractable: Secondary | ICD-10-CM | POA: Diagnosis not present

## 2019-07-29 DIAGNOSIS — N2581 Secondary hyperparathyroidism of renal origin: Secondary | ICD-10-CM | POA: Diagnosis not present

## 2019-07-29 DIAGNOSIS — E211 Secondary hyperparathyroidism, not elsewhere classified: Secondary | ICD-10-CM | POA: Diagnosis not present

## 2019-07-29 DIAGNOSIS — D631 Anemia in chronic kidney disease: Secondary | ICD-10-CM | POA: Diagnosis not present

## 2019-07-29 DIAGNOSIS — Z992 Dependence on renal dialysis: Secondary | ICD-10-CM | POA: Diagnosis not present

## 2019-07-29 DIAGNOSIS — E1122 Type 2 diabetes mellitus with diabetic chronic kidney disease: Secondary | ICD-10-CM | POA: Diagnosis not present

## 2019-07-29 DIAGNOSIS — N186 End stage renal disease: Secondary | ICD-10-CM | POA: Diagnosis not present

## 2019-07-29 DIAGNOSIS — R399 Unspecified symptoms and signs involving the genitourinary system: Secondary | ICD-10-CM | POA: Diagnosis not present

## 2019-08-01 DIAGNOSIS — Z992 Dependence on renal dialysis: Secondary | ICD-10-CM | POA: Diagnosis not present

## 2019-08-01 DIAGNOSIS — E1122 Type 2 diabetes mellitus with diabetic chronic kidney disease: Secondary | ICD-10-CM | POA: Diagnosis not present

## 2019-08-01 DIAGNOSIS — D631 Anemia in chronic kidney disease: Secondary | ICD-10-CM | POA: Diagnosis not present

## 2019-08-01 DIAGNOSIS — E211 Secondary hyperparathyroidism, not elsewhere classified: Secondary | ICD-10-CM | POA: Diagnosis not present

## 2019-08-01 DIAGNOSIS — N186 End stage renal disease: Secondary | ICD-10-CM | POA: Diagnosis not present

## 2019-08-01 DIAGNOSIS — N2581 Secondary hyperparathyroidism of renal origin: Secondary | ICD-10-CM | POA: Diagnosis not present

## 2019-08-03 DIAGNOSIS — Z992 Dependence on renal dialysis: Secondary | ICD-10-CM | POA: Diagnosis not present

## 2019-08-03 DIAGNOSIS — N2581 Secondary hyperparathyroidism of renal origin: Secondary | ICD-10-CM | POA: Diagnosis not present

## 2019-08-03 DIAGNOSIS — E1122 Type 2 diabetes mellitus with diabetic chronic kidney disease: Secondary | ICD-10-CM | POA: Diagnosis not present

## 2019-08-03 DIAGNOSIS — N186 End stage renal disease: Secondary | ICD-10-CM | POA: Diagnosis not present

## 2019-08-03 DIAGNOSIS — E211 Secondary hyperparathyroidism, not elsewhere classified: Secondary | ICD-10-CM | POA: Diagnosis not present

## 2019-08-03 DIAGNOSIS — D631 Anemia in chronic kidney disease: Secondary | ICD-10-CM | POA: Diagnosis not present

## 2019-08-05 DIAGNOSIS — N186 End stage renal disease: Secondary | ICD-10-CM | POA: Diagnosis not present

## 2019-08-05 DIAGNOSIS — N2581 Secondary hyperparathyroidism of renal origin: Secondary | ICD-10-CM | POA: Diagnosis not present

## 2019-08-05 DIAGNOSIS — D631 Anemia in chronic kidney disease: Secondary | ICD-10-CM | POA: Diagnosis not present

## 2019-08-05 DIAGNOSIS — E211 Secondary hyperparathyroidism, not elsewhere classified: Secondary | ICD-10-CM | POA: Diagnosis not present

## 2019-08-05 DIAGNOSIS — E1122 Type 2 diabetes mellitus with diabetic chronic kidney disease: Secondary | ICD-10-CM | POA: Diagnosis not present

## 2019-08-05 DIAGNOSIS — Z992 Dependence on renal dialysis: Secondary | ICD-10-CM | POA: Diagnosis not present

## 2019-08-08 DIAGNOSIS — M25571 Pain in right ankle and joints of right foot: Secondary | ICD-10-CM | POA: Diagnosis not present

## 2019-08-08 DIAGNOSIS — E1122 Type 2 diabetes mellitus with diabetic chronic kidney disease: Secondary | ICD-10-CM | POA: Diagnosis not present

## 2019-08-08 DIAGNOSIS — I10 Essential (primary) hypertension: Secondary | ICD-10-CM | POA: Diagnosis not present

## 2019-08-08 DIAGNOSIS — Z992 Dependence on renal dialysis: Secondary | ICD-10-CM | POA: Diagnosis not present

## 2019-08-08 DIAGNOSIS — N2581 Secondary hyperparathyroidism of renal origin: Secondary | ICD-10-CM | POA: Diagnosis not present

## 2019-08-08 DIAGNOSIS — E211 Secondary hyperparathyroidism, not elsewhere classified: Secondary | ICD-10-CM | POA: Diagnosis not present

## 2019-08-08 DIAGNOSIS — D631 Anemia in chronic kidney disease: Secondary | ICD-10-CM | POA: Diagnosis not present

## 2019-08-08 DIAGNOSIS — N186 End stage renal disease: Secondary | ICD-10-CM | POA: Diagnosis not present

## 2019-08-10 DIAGNOSIS — E211 Secondary hyperparathyroidism, not elsewhere classified: Secondary | ICD-10-CM | POA: Diagnosis not present

## 2019-08-10 DIAGNOSIS — E790 Hyperuricemia without signs of inflammatory arthritis and tophaceous disease: Secondary | ICD-10-CM | POA: Diagnosis not present

## 2019-08-10 DIAGNOSIS — N185 Chronic kidney disease, stage 5: Secondary | ICD-10-CM | POA: Diagnosis not present

## 2019-08-10 DIAGNOSIS — I10 Essential (primary) hypertension: Secondary | ICD-10-CM | POA: Diagnosis not present

## 2019-08-10 DIAGNOSIS — N186 End stage renal disease: Secondary | ICD-10-CM | POA: Diagnosis not present

## 2019-08-10 DIAGNOSIS — N2581 Secondary hyperparathyroidism of renal origin: Secondary | ICD-10-CM | POA: Diagnosis not present

## 2019-08-10 DIAGNOSIS — D631 Anemia in chronic kidney disease: Secondary | ICD-10-CM | POA: Diagnosis not present

## 2019-08-10 DIAGNOSIS — I1311 Hypertensive heart and chronic kidney disease without heart failure, with stage 5 chronic kidney disease, or end stage renal disease: Secondary | ICD-10-CM | POA: Diagnosis not present

## 2019-08-10 DIAGNOSIS — G478 Other sleep disorders: Secondary | ICD-10-CM | POA: Diagnosis not present

## 2019-08-10 DIAGNOSIS — Z992 Dependence on renal dialysis: Secondary | ICD-10-CM | POA: Diagnosis not present

## 2019-08-10 DIAGNOSIS — G4089 Other seizures: Secondary | ICD-10-CM | POA: Diagnosis not present

## 2019-08-10 DIAGNOSIS — R3989 Other symptoms and signs involving the genitourinary system: Secondary | ICD-10-CM | POA: Diagnosis not present

## 2019-08-10 DIAGNOSIS — E1122 Type 2 diabetes mellitus with diabetic chronic kidney disease: Secondary | ICD-10-CM | POA: Diagnosis not present

## 2019-08-12 DIAGNOSIS — N186 End stage renal disease: Secondary | ICD-10-CM | POA: Diagnosis not present

## 2019-08-12 DIAGNOSIS — N2581 Secondary hyperparathyroidism of renal origin: Secondary | ICD-10-CM | POA: Diagnosis not present

## 2019-08-12 DIAGNOSIS — Z992 Dependence on renal dialysis: Secondary | ICD-10-CM | POA: Diagnosis not present

## 2019-08-12 DIAGNOSIS — E211 Secondary hyperparathyroidism, not elsewhere classified: Secondary | ICD-10-CM | POA: Diagnosis not present

## 2019-08-12 DIAGNOSIS — E1122 Type 2 diabetes mellitus with diabetic chronic kidney disease: Secondary | ICD-10-CM | POA: Diagnosis not present

## 2019-08-12 DIAGNOSIS — D631 Anemia in chronic kidney disease: Secondary | ICD-10-CM | POA: Diagnosis not present

## 2019-08-15 DIAGNOSIS — E1122 Type 2 diabetes mellitus with diabetic chronic kidney disease: Secondary | ICD-10-CM | POA: Diagnosis not present

## 2019-08-15 DIAGNOSIS — E211 Secondary hyperparathyroidism, not elsewhere classified: Secondary | ICD-10-CM | POA: Diagnosis not present

## 2019-08-15 DIAGNOSIS — D631 Anemia in chronic kidney disease: Secondary | ICD-10-CM | POA: Diagnosis not present

## 2019-08-15 DIAGNOSIS — N186 End stage renal disease: Secondary | ICD-10-CM | POA: Diagnosis not present

## 2019-08-15 DIAGNOSIS — R300 Dysuria: Secondary | ICD-10-CM | POA: Diagnosis not present

## 2019-08-15 DIAGNOSIS — R0781 Pleurodynia: Secondary | ICD-10-CM | POA: Diagnosis not present

## 2019-08-15 DIAGNOSIS — N2581 Secondary hyperparathyroidism of renal origin: Secondary | ICD-10-CM | POA: Diagnosis not present

## 2019-08-15 DIAGNOSIS — Z992 Dependence on renal dialysis: Secondary | ICD-10-CM | POA: Diagnosis not present

## 2019-08-16 DIAGNOSIS — M6281 Muscle weakness (generalized): Secondary | ICD-10-CM | POA: Diagnosis not present

## 2019-08-16 DIAGNOSIS — M25571 Pain in right ankle and joints of right foot: Secondary | ICD-10-CM | POA: Diagnosis not present

## 2019-08-16 DIAGNOSIS — Z992 Dependence on renal dialysis: Secondary | ICD-10-CM | POA: Diagnosis not present

## 2019-08-16 DIAGNOSIS — E1129 Type 2 diabetes mellitus with other diabetic kidney complication: Secondary | ICD-10-CM | POA: Diagnosis not present

## 2019-08-16 DIAGNOSIS — R498 Other voice and resonance disorders: Secondary | ICD-10-CM | POA: Diagnosis not present

## 2019-08-16 DIAGNOSIS — N186 End stage renal disease: Secondary | ICD-10-CM | POA: Diagnosis not present

## 2019-08-16 DIAGNOSIS — R293 Abnormal posture: Secondary | ICD-10-CM | POA: Diagnosis not present

## 2019-08-16 DIAGNOSIS — J9611 Chronic respiratory failure with hypoxia: Secondary | ICD-10-CM | POA: Diagnosis not present

## 2019-08-16 DIAGNOSIS — R41841 Cognitive communication deficit: Secondary | ICD-10-CM | POA: Diagnosis not present

## 2019-08-17 DIAGNOSIS — N2581 Secondary hyperparathyroidism of renal origin: Secondary | ICD-10-CM | POA: Diagnosis not present

## 2019-08-17 DIAGNOSIS — E211 Secondary hyperparathyroidism, not elsewhere classified: Secondary | ICD-10-CM | POA: Diagnosis not present

## 2019-08-17 DIAGNOSIS — D631 Anemia in chronic kidney disease: Secondary | ICD-10-CM | POA: Diagnosis not present

## 2019-08-17 DIAGNOSIS — Z992 Dependence on renal dialysis: Secondary | ICD-10-CM | POA: Diagnosis not present

## 2019-08-17 DIAGNOSIS — N186 End stage renal disease: Secondary | ICD-10-CM | POA: Diagnosis not present

## 2019-08-17 DIAGNOSIS — E1122 Type 2 diabetes mellitus with diabetic chronic kidney disease: Secondary | ICD-10-CM | POA: Diagnosis not present

## 2019-08-18 DIAGNOSIS — N186 End stage renal disease: Secondary | ICD-10-CM | POA: Diagnosis not present

## 2019-08-18 DIAGNOSIS — R3989 Other symptoms and signs involving the genitourinary system: Secondary | ICD-10-CM | POA: Diagnosis not present

## 2019-08-18 DIAGNOSIS — E1129 Type 2 diabetes mellitus with other diabetic kidney complication: Secondary | ICD-10-CM | POA: Diagnosis not present

## 2019-08-18 DIAGNOSIS — R498 Other voice and resonance disorders: Secondary | ICD-10-CM | POA: Diagnosis not present

## 2019-08-18 DIAGNOSIS — Z992 Dependence on renal dialysis: Secondary | ICD-10-CM | POA: Diagnosis not present

## 2019-08-18 DIAGNOSIS — N185 Chronic kidney disease, stage 5: Secondary | ICD-10-CM | POA: Diagnosis not present

## 2019-08-18 DIAGNOSIS — R0781 Pleurodynia: Secondary | ICD-10-CM | POA: Diagnosis not present

## 2019-08-18 DIAGNOSIS — M25571 Pain in right ankle and joints of right foot: Secondary | ICD-10-CM | POA: Diagnosis not present

## 2019-08-18 DIAGNOSIS — M6281 Muscle weakness (generalized): Secondary | ICD-10-CM | POA: Diagnosis not present

## 2019-08-19 DIAGNOSIS — N2581 Secondary hyperparathyroidism of renal origin: Secondary | ICD-10-CM | POA: Diagnosis not present

## 2019-08-19 DIAGNOSIS — N186 End stage renal disease: Secondary | ICD-10-CM | POA: Diagnosis not present

## 2019-08-19 DIAGNOSIS — E1122 Type 2 diabetes mellitus with diabetic chronic kidney disease: Secondary | ICD-10-CM | POA: Diagnosis not present

## 2019-08-19 DIAGNOSIS — D631 Anemia in chronic kidney disease: Secondary | ICD-10-CM | POA: Diagnosis not present

## 2019-08-19 DIAGNOSIS — E211 Secondary hyperparathyroidism, not elsewhere classified: Secondary | ICD-10-CM | POA: Diagnosis not present

## 2019-08-19 DIAGNOSIS — Z992 Dependence on renal dialysis: Secondary | ICD-10-CM | POA: Diagnosis not present

## 2019-08-22 DIAGNOSIS — E1122 Type 2 diabetes mellitus with diabetic chronic kidney disease: Secondary | ICD-10-CM | POA: Diagnosis not present

## 2019-08-22 DIAGNOSIS — Z992 Dependence on renal dialysis: Secondary | ICD-10-CM | POA: Diagnosis not present

## 2019-08-22 DIAGNOSIS — D631 Anemia in chronic kidney disease: Secondary | ICD-10-CM | POA: Diagnosis not present

## 2019-08-22 DIAGNOSIS — N2581 Secondary hyperparathyroidism of renal origin: Secondary | ICD-10-CM | POA: Diagnosis not present

## 2019-08-22 DIAGNOSIS — E211 Secondary hyperparathyroidism, not elsewhere classified: Secondary | ICD-10-CM | POA: Diagnosis not present

## 2019-08-22 DIAGNOSIS — N186 End stage renal disease: Secondary | ICD-10-CM | POA: Diagnosis not present

## 2019-08-23 DIAGNOSIS — M62838 Other muscle spasm: Secondary | ICD-10-CM | POA: Diagnosis not present

## 2019-08-23 DIAGNOSIS — Z9882 Breast implant status: Secondary | ICD-10-CM | POA: Diagnosis not present

## 2019-08-23 DIAGNOSIS — E1122 Type 2 diabetes mellitus with diabetic chronic kidney disease: Secondary | ICD-10-CM | POA: Diagnosis not present

## 2019-08-23 DIAGNOSIS — R293 Abnormal posture: Secondary | ICD-10-CM | POA: Diagnosis not present

## 2019-08-23 DIAGNOSIS — N186 End stage renal disease: Secondary | ICD-10-CM | POA: Diagnosis not present

## 2019-08-23 DIAGNOSIS — J9611 Chronic respiratory failure with hypoxia: Secondary | ICD-10-CM | POA: Diagnosis not present

## 2019-08-23 DIAGNOSIS — E1129 Type 2 diabetes mellitus with other diabetic kidney complication: Secondary | ICD-10-CM | POA: Diagnosis not present

## 2019-08-23 DIAGNOSIS — M25571 Pain in right ankle and joints of right foot: Secondary | ICD-10-CM | POA: Diagnosis not present

## 2019-08-23 DIAGNOSIS — M6281 Muscle weakness (generalized): Secondary | ICD-10-CM | POA: Diagnosis not present

## 2019-08-23 DIAGNOSIS — R41841 Cognitive communication deficit: Secondary | ICD-10-CM | POA: Diagnosis not present

## 2019-08-23 DIAGNOSIS — R498 Other voice and resonance disorders: Secondary | ICD-10-CM | POA: Diagnosis not present

## 2019-08-23 DIAGNOSIS — Z992 Dependence on renal dialysis: Secondary | ICD-10-CM | POA: Diagnosis not present

## 2019-08-24 DIAGNOSIS — N186 End stage renal disease: Secondary | ICD-10-CM | POA: Diagnosis not present

## 2019-08-24 DIAGNOSIS — E1122 Type 2 diabetes mellitus with diabetic chronic kidney disease: Secondary | ICD-10-CM | POA: Diagnosis not present

## 2019-08-24 DIAGNOSIS — Z23 Encounter for immunization: Secondary | ICD-10-CM | POA: Diagnosis not present

## 2019-08-24 DIAGNOSIS — E211 Secondary hyperparathyroidism, not elsewhere classified: Secondary | ICD-10-CM | POA: Diagnosis not present

## 2019-08-24 DIAGNOSIS — Z992 Dependence on renal dialysis: Secondary | ICD-10-CM | POA: Diagnosis not present

## 2019-08-24 DIAGNOSIS — D631 Anemia in chronic kidney disease: Secondary | ICD-10-CM | POA: Diagnosis not present

## 2019-08-24 DIAGNOSIS — R918 Other nonspecific abnormal finding of lung field: Secondary | ICD-10-CM | POA: Diagnosis not present

## 2019-08-24 DIAGNOSIS — N2581 Secondary hyperparathyroidism of renal origin: Secondary | ICD-10-CM | POA: Diagnosis not present

## 2019-08-25 DIAGNOSIS — M6281 Muscle weakness (generalized): Secondary | ICD-10-CM | POA: Diagnosis not present

## 2019-08-25 DIAGNOSIS — M25571 Pain in right ankle and joints of right foot: Secondary | ICD-10-CM | POA: Diagnosis not present

## 2019-08-25 DIAGNOSIS — E1129 Type 2 diabetes mellitus with other diabetic kidney complication: Secondary | ICD-10-CM | POA: Diagnosis not present

## 2019-08-25 DIAGNOSIS — J188 Other pneumonia, unspecified organism: Secondary | ICD-10-CM | POA: Diagnosis not present

## 2019-08-25 DIAGNOSIS — N186 End stage renal disease: Secondary | ICD-10-CM | POA: Diagnosis not present

## 2019-08-25 DIAGNOSIS — Z992 Dependence on renal dialysis: Secondary | ICD-10-CM | POA: Diagnosis not present

## 2019-08-25 DIAGNOSIS — R498 Other voice and resonance disorders: Secondary | ICD-10-CM | POA: Diagnosis not present

## 2019-08-25 DIAGNOSIS — T148XXA Other injury of unspecified body region, initial encounter: Secondary | ICD-10-CM | POA: Diagnosis not present

## 2019-08-26 DIAGNOSIS — Z992 Dependence on renal dialysis: Secondary | ICD-10-CM | POA: Diagnosis not present

## 2019-08-26 DIAGNOSIS — N2581 Secondary hyperparathyroidism of renal origin: Secondary | ICD-10-CM | POA: Diagnosis not present

## 2019-08-26 DIAGNOSIS — Z23 Encounter for immunization: Secondary | ICD-10-CM | POA: Diagnosis not present

## 2019-08-26 DIAGNOSIS — N186 End stage renal disease: Secondary | ICD-10-CM | POA: Diagnosis not present

## 2019-08-26 DIAGNOSIS — E211 Secondary hyperparathyroidism, not elsewhere classified: Secondary | ICD-10-CM | POA: Diagnosis not present

## 2019-08-26 DIAGNOSIS — E1122 Type 2 diabetes mellitus with diabetic chronic kidney disease: Secondary | ICD-10-CM | POA: Diagnosis not present

## 2019-08-29 DIAGNOSIS — E1122 Type 2 diabetes mellitus with diabetic chronic kidney disease: Secondary | ICD-10-CM | POA: Diagnosis not present

## 2019-08-29 DIAGNOSIS — Z992 Dependence on renal dialysis: Secondary | ICD-10-CM | POA: Diagnosis not present

## 2019-08-29 DIAGNOSIS — E211 Secondary hyperparathyroidism, not elsewhere classified: Secondary | ICD-10-CM | POA: Diagnosis not present

## 2019-08-29 DIAGNOSIS — N186 End stage renal disease: Secondary | ICD-10-CM | POA: Diagnosis not present

## 2019-08-29 DIAGNOSIS — N2581 Secondary hyperparathyroidism of renal origin: Secondary | ICD-10-CM | POA: Diagnosis not present

## 2019-08-29 DIAGNOSIS — Z23 Encounter for immunization: Secondary | ICD-10-CM | POA: Diagnosis not present

## 2019-08-30 DIAGNOSIS — E1129 Type 2 diabetes mellitus with other diabetic kidney complication: Secondary | ICD-10-CM | POA: Diagnosis not present

## 2019-08-30 DIAGNOSIS — M6281 Muscle weakness (generalized): Secondary | ICD-10-CM | POA: Diagnosis not present

## 2019-08-30 DIAGNOSIS — Z992 Dependence on renal dialysis: Secondary | ICD-10-CM | POA: Diagnosis not present

## 2019-08-30 DIAGNOSIS — M25571 Pain in right ankle and joints of right foot: Secondary | ICD-10-CM | POA: Diagnosis not present

## 2019-08-30 DIAGNOSIS — R498 Other voice and resonance disorders: Secondary | ICD-10-CM | POA: Diagnosis not present

## 2019-08-30 DIAGNOSIS — N186 End stage renal disease: Secondary | ICD-10-CM | POA: Diagnosis not present

## 2019-08-31 DIAGNOSIS — N2581 Secondary hyperparathyroidism of renal origin: Secondary | ICD-10-CM | POA: Diagnosis not present

## 2019-08-31 DIAGNOSIS — N186 End stage renal disease: Secondary | ICD-10-CM | POA: Diagnosis not present

## 2019-08-31 DIAGNOSIS — Z992 Dependence on renal dialysis: Secondary | ICD-10-CM | POA: Diagnosis not present

## 2019-08-31 DIAGNOSIS — E211 Secondary hyperparathyroidism, not elsewhere classified: Secondary | ICD-10-CM | POA: Diagnosis not present

## 2019-08-31 DIAGNOSIS — Z23 Encounter for immunization: Secondary | ICD-10-CM | POA: Diagnosis not present

## 2019-08-31 DIAGNOSIS — E1122 Type 2 diabetes mellitus with diabetic chronic kidney disease: Secondary | ICD-10-CM | POA: Diagnosis not present

## 2019-09-01 DIAGNOSIS — M25571 Pain in right ankle and joints of right foot: Secondary | ICD-10-CM | POA: Diagnosis not present

## 2019-09-01 DIAGNOSIS — R498 Other voice and resonance disorders: Secondary | ICD-10-CM | POA: Diagnosis not present

## 2019-09-01 DIAGNOSIS — M6281 Muscle weakness (generalized): Secondary | ICD-10-CM | POA: Diagnosis not present

## 2019-09-01 DIAGNOSIS — Z992 Dependence on renal dialysis: Secondary | ICD-10-CM | POA: Diagnosis not present

## 2019-09-01 DIAGNOSIS — E1129 Type 2 diabetes mellitus with other diabetic kidney complication: Secondary | ICD-10-CM | POA: Diagnosis not present

## 2019-09-01 DIAGNOSIS — N186 End stage renal disease: Secondary | ICD-10-CM | POA: Diagnosis not present

## 2019-09-02 DIAGNOSIS — N186 End stage renal disease: Secondary | ICD-10-CM | POA: Diagnosis not present

## 2019-09-02 DIAGNOSIS — N2581 Secondary hyperparathyroidism of renal origin: Secondary | ICD-10-CM | POA: Diagnosis not present

## 2019-09-02 DIAGNOSIS — E211 Secondary hyperparathyroidism, not elsewhere classified: Secondary | ICD-10-CM | POA: Diagnosis not present

## 2019-09-02 DIAGNOSIS — Z23 Encounter for immunization: Secondary | ICD-10-CM | POA: Diagnosis not present

## 2019-09-02 DIAGNOSIS — E1122 Type 2 diabetes mellitus with diabetic chronic kidney disease: Secondary | ICD-10-CM | POA: Diagnosis not present

## 2019-09-02 DIAGNOSIS — Z992 Dependence on renal dialysis: Secondary | ICD-10-CM | POA: Diagnosis not present

## 2019-09-05 DIAGNOSIS — Z992 Dependence on renal dialysis: Secondary | ICD-10-CM | POA: Diagnosis not present

## 2019-09-05 DIAGNOSIS — Z23 Encounter for immunization: Secondary | ICD-10-CM | POA: Diagnosis not present

## 2019-09-05 DIAGNOSIS — E211 Secondary hyperparathyroidism, not elsewhere classified: Secondary | ICD-10-CM | POA: Diagnosis not present

## 2019-09-05 DIAGNOSIS — N186 End stage renal disease: Secondary | ICD-10-CM | POA: Diagnosis not present

## 2019-09-05 DIAGNOSIS — N2581 Secondary hyperparathyroidism of renal origin: Secondary | ICD-10-CM | POA: Diagnosis not present

## 2019-09-05 DIAGNOSIS — E1122 Type 2 diabetes mellitus with diabetic chronic kidney disease: Secondary | ICD-10-CM | POA: Diagnosis not present

## 2019-09-06 ENCOUNTER — Telehealth: Payer: Self-pay | Admitting: Pulmonary Disease

## 2019-09-06 DIAGNOSIS — M6281 Muscle weakness (generalized): Secondary | ICD-10-CM | POA: Diagnosis not present

## 2019-09-06 DIAGNOSIS — M25571 Pain in right ankle and joints of right foot: Secondary | ICD-10-CM | POA: Diagnosis not present

## 2019-09-06 DIAGNOSIS — Z992 Dependence on renal dialysis: Secondary | ICD-10-CM | POA: Diagnosis not present

## 2019-09-06 DIAGNOSIS — E1129 Type 2 diabetes mellitus with other diabetic kidney complication: Secondary | ICD-10-CM | POA: Diagnosis not present

## 2019-09-06 DIAGNOSIS — R498 Other voice and resonance disorders: Secondary | ICD-10-CM | POA: Diagnosis not present

## 2019-09-06 DIAGNOSIS — N186 End stage renal disease: Secondary | ICD-10-CM | POA: Diagnosis not present

## 2019-09-06 NOTE — Telephone Encounter (Signed)
lmtcb for pt. Need to see if she is picking up ct report or wants this mailed to her.

## 2019-09-07 DIAGNOSIS — E1122 Type 2 diabetes mellitus with diabetic chronic kidney disease: Secondary | ICD-10-CM | POA: Diagnosis not present

## 2019-09-07 DIAGNOSIS — E211 Secondary hyperparathyroidism, not elsewhere classified: Secondary | ICD-10-CM | POA: Diagnosis not present

## 2019-09-07 DIAGNOSIS — N2581 Secondary hyperparathyroidism of renal origin: Secondary | ICD-10-CM | POA: Diagnosis not present

## 2019-09-07 DIAGNOSIS — N186 End stage renal disease: Secondary | ICD-10-CM | POA: Diagnosis not present

## 2019-09-07 DIAGNOSIS — Z992 Dependence on renal dialysis: Secondary | ICD-10-CM | POA: Diagnosis not present

## 2019-09-07 DIAGNOSIS — Z23 Encounter for immunization: Secondary | ICD-10-CM | POA: Diagnosis not present

## 2019-09-07 NOTE — Telephone Encounter (Signed)
ATC patient, unable to reach, left detailed message for patient and reminded her she is on mychart and could access her records there if she wanted to as well.   Will wait for call back from patient.

## 2019-09-08 DIAGNOSIS — N186 End stage renal disease: Secondary | ICD-10-CM | POA: Diagnosis not present

## 2019-09-08 DIAGNOSIS — M25571 Pain in right ankle and joints of right foot: Secondary | ICD-10-CM | POA: Diagnosis not present

## 2019-09-08 DIAGNOSIS — Z992 Dependence on renal dialysis: Secondary | ICD-10-CM | POA: Diagnosis not present

## 2019-09-08 DIAGNOSIS — M6281 Muscle weakness (generalized): Secondary | ICD-10-CM | POA: Diagnosis not present

## 2019-09-08 DIAGNOSIS — R498 Other voice and resonance disorders: Secondary | ICD-10-CM | POA: Diagnosis not present

## 2019-09-08 DIAGNOSIS — E1129 Type 2 diabetes mellitus with other diabetic kidney complication: Secondary | ICD-10-CM | POA: Diagnosis not present

## 2019-09-09 DIAGNOSIS — N186 End stage renal disease: Secondary | ICD-10-CM | POA: Diagnosis not present

## 2019-09-09 DIAGNOSIS — E211 Secondary hyperparathyroidism, not elsewhere classified: Secondary | ICD-10-CM | POA: Diagnosis not present

## 2019-09-09 DIAGNOSIS — E1122 Type 2 diabetes mellitus with diabetic chronic kidney disease: Secondary | ICD-10-CM | POA: Diagnosis not present

## 2019-09-09 DIAGNOSIS — Z992 Dependence on renal dialysis: Secondary | ICD-10-CM | POA: Diagnosis not present

## 2019-09-09 DIAGNOSIS — N2581 Secondary hyperparathyroidism of renal origin: Secondary | ICD-10-CM | POA: Diagnosis not present

## 2019-09-09 DIAGNOSIS — Z23 Encounter for immunization: Secondary | ICD-10-CM | POA: Diagnosis not present

## 2019-09-09 NOTE — Telephone Encounter (Signed)
Left message for patient to call back  

## 2019-09-12 DIAGNOSIS — T82868A Thrombosis of vascular prosthetic devices, implants and grafts, initial encounter: Secondary | ICD-10-CM | POA: Diagnosis not present

## 2019-09-12 DIAGNOSIS — Z992 Dependence on renal dialysis: Secondary | ICD-10-CM | POA: Diagnosis not present

## 2019-09-12 DIAGNOSIS — N186 End stage renal disease: Secondary | ICD-10-CM | POA: Diagnosis not present

## 2019-09-12 DIAGNOSIS — I871 Compression of vein: Secondary | ICD-10-CM | POA: Diagnosis not present

## 2019-09-12 DIAGNOSIS — T82858A Stenosis of vascular prosthetic devices, implants and grafts, initial encounter: Secondary | ICD-10-CM | POA: Diagnosis not present

## 2019-09-13 DIAGNOSIS — Z23 Encounter for immunization: Secondary | ICD-10-CM | POA: Diagnosis not present

## 2019-09-13 DIAGNOSIS — Z992 Dependence on renal dialysis: Secondary | ICD-10-CM | POA: Diagnosis not present

## 2019-09-13 DIAGNOSIS — N2581 Secondary hyperparathyroidism of renal origin: Secondary | ICD-10-CM | POA: Diagnosis not present

## 2019-09-13 DIAGNOSIS — E211 Secondary hyperparathyroidism, not elsewhere classified: Secondary | ICD-10-CM | POA: Diagnosis not present

## 2019-09-13 DIAGNOSIS — N186 End stage renal disease: Secondary | ICD-10-CM | POA: Diagnosis not present

## 2019-09-13 DIAGNOSIS — E1122 Type 2 diabetes mellitus with diabetic chronic kidney disease: Secondary | ICD-10-CM | POA: Diagnosis not present

## 2019-09-14 DIAGNOSIS — Z23 Encounter for immunization: Secondary | ICD-10-CM | POA: Diagnosis not present

## 2019-09-14 DIAGNOSIS — Z992 Dependence on renal dialysis: Secondary | ICD-10-CM | POA: Diagnosis not present

## 2019-09-14 DIAGNOSIS — N186 End stage renal disease: Secondary | ICD-10-CM | POA: Diagnosis not present

## 2019-09-14 DIAGNOSIS — E211 Secondary hyperparathyroidism, not elsewhere classified: Secondary | ICD-10-CM | POA: Diagnosis not present

## 2019-09-14 DIAGNOSIS — E1122 Type 2 diabetes mellitus with diabetic chronic kidney disease: Secondary | ICD-10-CM | POA: Diagnosis not present

## 2019-09-14 DIAGNOSIS — N2581 Secondary hyperparathyroidism of renal origin: Secondary | ICD-10-CM | POA: Diagnosis not present

## 2019-09-15 DIAGNOSIS — M25571 Pain in right ankle and joints of right foot: Secondary | ICD-10-CM | POA: Diagnosis not present

## 2019-09-15 DIAGNOSIS — N186 End stage renal disease: Secondary | ICD-10-CM | POA: Diagnosis not present

## 2019-09-15 DIAGNOSIS — Z992 Dependence on renal dialysis: Secondary | ICD-10-CM | POA: Diagnosis not present

## 2019-09-15 DIAGNOSIS — M6281 Muscle weakness (generalized): Secondary | ICD-10-CM | POA: Diagnosis not present

## 2019-09-15 DIAGNOSIS — R498 Other voice and resonance disorders: Secondary | ICD-10-CM | POA: Diagnosis not present

## 2019-09-15 DIAGNOSIS — T82590A Other mechanical complication of surgically created arteriovenous fistula, initial encounter: Secondary | ICD-10-CM | POA: Diagnosis not present

## 2019-09-15 DIAGNOSIS — M940 Chondrocostal junction syndrome [Tietze]: Secondary | ICD-10-CM | POA: Diagnosis not present

## 2019-09-15 DIAGNOSIS — D6489 Other specified anemias: Secondary | ICD-10-CM | POA: Diagnosis not present

## 2019-09-15 DIAGNOSIS — E1129 Type 2 diabetes mellitus with other diabetic kidney complication: Secondary | ICD-10-CM | POA: Diagnosis not present

## 2019-09-16 DIAGNOSIS — Z23 Encounter for immunization: Secondary | ICD-10-CM | POA: Diagnosis not present

## 2019-09-16 DIAGNOSIS — N2581 Secondary hyperparathyroidism of renal origin: Secondary | ICD-10-CM | POA: Diagnosis not present

## 2019-09-16 DIAGNOSIS — N186 End stage renal disease: Secondary | ICD-10-CM | POA: Diagnosis not present

## 2019-09-16 DIAGNOSIS — E211 Secondary hyperparathyroidism, not elsewhere classified: Secondary | ICD-10-CM | POA: Diagnosis not present

## 2019-09-16 DIAGNOSIS — Z992 Dependence on renal dialysis: Secondary | ICD-10-CM | POA: Diagnosis not present

## 2019-09-16 DIAGNOSIS — E1122 Type 2 diabetes mellitus with diabetic chronic kidney disease: Secondary | ICD-10-CM | POA: Diagnosis not present

## 2019-09-17 DIAGNOSIS — M25571 Pain in right ankle and joints of right foot: Secondary | ICD-10-CM | POA: Diagnosis not present

## 2019-09-17 DIAGNOSIS — E1129 Type 2 diabetes mellitus with other diabetic kidney complication: Secondary | ICD-10-CM | POA: Diagnosis not present

## 2019-09-17 DIAGNOSIS — R498 Other voice and resonance disorders: Secondary | ICD-10-CM | POA: Diagnosis not present

## 2019-09-17 DIAGNOSIS — Z992 Dependence on renal dialysis: Secondary | ICD-10-CM | POA: Diagnosis not present

## 2019-09-17 DIAGNOSIS — N186 End stage renal disease: Secondary | ICD-10-CM | POA: Diagnosis not present

## 2019-09-17 DIAGNOSIS — M6281 Muscle weakness (generalized): Secondary | ICD-10-CM | POA: Diagnosis not present

## 2019-09-19 DIAGNOSIS — Z992 Dependence on renal dialysis: Secondary | ICD-10-CM | POA: Diagnosis not present

## 2019-09-19 DIAGNOSIS — E1122 Type 2 diabetes mellitus with diabetic chronic kidney disease: Secondary | ICD-10-CM | POA: Diagnosis not present

## 2019-09-19 DIAGNOSIS — N186 End stage renal disease: Secondary | ICD-10-CM | POA: Diagnosis not present

## 2019-09-19 DIAGNOSIS — E211 Secondary hyperparathyroidism, not elsewhere classified: Secondary | ICD-10-CM | POA: Diagnosis not present

## 2019-09-19 DIAGNOSIS — Z23 Encounter for immunization: Secondary | ICD-10-CM | POA: Diagnosis not present

## 2019-09-19 DIAGNOSIS — N2581 Secondary hyperparathyroidism of renal origin: Secondary | ICD-10-CM | POA: Diagnosis not present

## 2019-09-20 DIAGNOSIS — E1129 Type 2 diabetes mellitus with other diabetic kidney complication: Secondary | ICD-10-CM | POA: Diagnosis not present

## 2019-09-20 DIAGNOSIS — N186 End stage renal disease: Secondary | ICD-10-CM | POA: Diagnosis not present

## 2019-09-20 DIAGNOSIS — M6281 Muscle weakness (generalized): Secondary | ICD-10-CM | POA: Diagnosis not present

## 2019-09-20 DIAGNOSIS — Z992 Dependence on renal dialysis: Secondary | ICD-10-CM | POA: Diagnosis not present

## 2019-09-20 DIAGNOSIS — M25571 Pain in right ankle and joints of right foot: Secondary | ICD-10-CM | POA: Diagnosis not present

## 2019-09-20 DIAGNOSIS — R498 Other voice and resonance disorders: Secondary | ICD-10-CM | POA: Diagnosis not present

## 2019-09-20 DIAGNOSIS — Z20828 Contact with and (suspected) exposure to other viral communicable diseases: Secondary | ICD-10-CM | POA: Diagnosis not present

## 2019-09-21 DIAGNOSIS — Z992 Dependence on renal dialysis: Secondary | ICD-10-CM | POA: Diagnosis not present

## 2019-09-21 DIAGNOSIS — Z23 Encounter for immunization: Secondary | ICD-10-CM | POA: Diagnosis not present

## 2019-09-21 DIAGNOSIS — E211 Secondary hyperparathyroidism, not elsewhere classified: Secondary | ICD-10-CM | POA: Diagnosis not present

## 2019-09-21 DIAGNOSIS — N2581 Secondary hyperparathyroidism of renal origin: Secondary | ICD-10-CM | POA: Diagnosis not present

## 2019-09-21 DIAGNOSIS — E1122 Type 2 diabetes mellitus with diabetic chronic kidney disease: Secondary | ICD-10-CM | POA: Diagnosis not present

## 2019-09-21 DIAGNOSIS — N186 End stage renal disease: Secondary | ICD-10-CM | POA: Diagnosis not present

## 2019-09-22 DIAGNOSIS — R110 Nausea: Secondary | ICD-10-CM | POA: Diagnosis not present

## 2019-09-22 DIAGNOSIS — M6281 Muscle weakness (generalized): Secondary | ICD-10-CM | POA: Diagnosis not present

## 2019-09-22 DIAGNOSIS — E1122 Type 2 diabetes mellitus with diabetic chronic kidney disease: Secondary | ICD-10-CM | POA: Diagnosis not present

## 2019-09-22 DIAGNOSIS — R293 Abnormal posture: Secondary | ICD-10-CM | POA: Diagnosis not present

## 2019-09-22 DIAGNOSIS — M25571 Pain in right ankle and joints of right foot: Secondary | ICD-10-CM | POA: Diagnosis not present

## 2019-09-22 DIAGNOSIS — Z992 Dependence on renal dialysis: Secondary | ICD-10-CM | POA: Diagnosis not present

## 2019-09-22 DIAGNOSIS — T82868A Thrombosis of vascular prosthetic devices, implants and grafts, initial encounter: Secondary | ICD-10-CM | POA: Diagnosis not present

## 2019-09-22 DIAGNOSIS — N186 End stage renal disease: Secondary | ICD-10-CM | POA: Diagnosis not present

## 2019-09-22 DIAGNOSIS — R498 Other voice and resonance disorders: Secondary | ICD-10-CM | POA: Diagnosis not present

## 2019-09-22 DIAGNOSIS — E1129 Type 2 diabetes mellitus with other diabetic kidney complication: Secondary | ICD-10-CM | POA: Diagnosis not present

## 2019-09-22 DIAGNOSIS — J9611 Chronic respiratory failure with hypoxia: Secondary | ICD-10-CM | POA: Diagnosis not present

## 2019-09-22 DIAGNOSIS — R41841 Cognitive communication deficit: Secondary | ICD-10-CM | POA: Diagnosis not present

## 2019-09-23 DIAGNOSIS — N2581 Secondary hyperparathyroidism of renal origin: Secondary | ICD-10-CM | POA: Diagnosis not present

## 2019-09-23 DIAGNOSIS — E211 Secondary hyperparathyroidism, not elsewhere classified: Secondary | ICD-10-CM | POA: Diagnosis not present

## 2019-09-23 DIAGNOSIS — N186 End stage renal disease: Secondary | ICD-10-CM | POA: Diagnosis not present

## 2019-09-23 DIAGNOSIS — E1122 Type 2 diabetes mellitus with diabetic chronic kidney disease: Secondary | ICD-10-CM | POA: Diagnosis not present

## 2019-09-23 DIAGNOSIS — Z992 Dependence on renal dialysis: Secondary | ICD-10-CM | POA: Diagnosis not present

## 2019-09-26 DIAGNOSIS — N2581 Secondary hyperparathyroidism of renal origin: Secondary | ICD-10-CM | POA: Diagnosis not present

## 2019-09-26 DIAGNOSIS — E1122 Type 2 diabetes mellitus with diabetic chronic kidney disease: Secondary | ICD-10-CM | POA: Diagnosis not present

## 2019-09-26 DIAGNOSIS — E211 Secondary hyperparathyroidism, not elsewhere classified: Secondary | ICD-10-CM | POA: Diagnosis not present

## 2019-09-26 DIAGNOSIS — N186 End stage renal disease: Secondary | ICD-10-CM | POA: Diagnosis not present

## 2019-09-26 DIAGNOSIS — Z992 Dependence on renal dialysis: Secondary | ICD-10-CM | POA: Diagnosis not present

## 2019-09-27 DIAGNOSIS — M6281 Muscle weakness (generalized): Secondary | ICD-10-CM | POA: Diagnosis not present

## 2019-09-27 DIAGNOSIS — N186 End stage renal disease: Secondary | ICD-10-CM | POA: Diagnosis not present

## 2019-09-27 DIAGNOSIS — U071 COVID-19: Secondary | ICD-10-CM | POA: Diagnosis not present

## 2019-09-27 DIAGNOSIS — Z992 Dependence on renal dialysis: Secondary | ICD-10-CM | POA: Diagnosis not present

## 2019-09-27 DIAGNOSIS — R498 Other voice and resonance disorders: Secondary | ICD-10-CM | POA: Diagnosis not present

## 2019-09-27 DIAGNOSIS — M25571 Pain in right ankle and joints of right foot: Secondary | ICD-10-CM | POA: Diagnosis not present

## 2019-09-27 DIAGNOSIS — E1129 Type 2 diabetes mellitus with other diabetic kidney complication: Secondary | ICD-10-CM | POA: Diagnosis not present

## 2019-09-28 DIAGNOSIS — E211 Secondary hyperparathyroidism, not elsewhere classified: Secondary | ICD-10-CM | POA: Diagnosis not present

## 2019-09-28 DIAGNOSIS — N2581 Secondary hyperparathyroidism of renal origin: Secondary | ICD-10-CM | POA: Diagnosis not present

## 2019-09-28 DIAGNOSIS — N186 End stage renal disease: Secondary | ICD-10-CM | POA: Diagnosis not present

## 2019-09-28 DIAGNOSIS — E1122 Type 2 diabetes mellitus with diabetic chronic kidney disease: Secondary | ICD-10-CM | POA: Diagnosis not present

## 2019-09-28 DIAGNOSIS — Z992 Dependence on renal dialysis: Secondary | ICD-10-CM | POA: Diagnosis not present

## 2019-09-29 DIAGNOSIS — N186 End stage renal disease: Secondary | ICD-10-CM | POA: Diagnosis not present

## 2019-09-29 DIAGNOSIS — M6281 Muscle weakness (generalized): Secondary | ICD-10-CM | POA: Diagnosis not present

## 2019-09-29 DIAGNOSIS — E1129 Type 2 diabetes mellitus with other diabetic kidney complication: Secondary | ICD-10-CM | POA: Diagnosis not present

## 2019-09-29 DIAGNOSIS — R498 Other voice and resonance disorders: Secondary | ICD-10-CM | POA: Diagnosis not present

## 2019-09-29 DIAGNOSIS — Z992 Dependence on renal dialysis: Secondary | ICD-10-CM | POA: Diagnosis not present

## 2019-09-29 DIAGNOSIS — M25571 Pain in right ankle and joints of right foot: Secondary | ICD-10-CM | POA: Diagnosis not present

## 2019-09-30 DIAGNOSIS — N2581 Secondary hyperparathyroidism of renal origin: Secondary | ICD-10-CM | POA: Diagnosis not present

## 2019-09-30 DIAGNOSIS — E1122 Type 2 diabetes mellitus with diabetic chronic kidney disease: Secondary | ICD-10-CM | POA: Diagnosis not present

## 2019-09-30 DIAGNOSIS — E211 Secondary hyperparathyroidism, not elsewhere classified: Secondary | ICD-10-CM | POA: Diagnosis not present

## 2019-09-30 DIAGNOSIS — Z992 Dependence on renal dialysis: Secondary | ICD-10-CM | POA: Diagnosis not present

## 2019-09-30 DIAGNOSIS — N186 End stage renal disease: Secondary | ICD-10-CM | POA: Diagnosis not present

## 2019-10-03 DIAGNOSIS — Z992 Dependence on renal dialysis: Secondary | ICD-10-CM | POA: Diagnosis not present

## 2019-10-03 DIAGNOSIS — E211 Secondary hyperparathyroidism, not elsewhere classified: Secondary | ICD-10-CM | POA: Diagnosis not present

## 2019-10-03 DIAGNOSIS — E1122 Type 2 diabetes mellitus with diabetic chronic kidney disease: Secondary | ICD-10-CM | POA: Diagnosis not present

## 2019-10-03 DIAGNOSIS — N2581 Secondary hyperparathyroidism of renal origin: Secondary | ICD-10-CM | POA: Diagnosis not present

## 2019-10-03 DIAGNOSIS — N186 End stage renal disease: Secondary | ICD-10-CM | POA: Diagnosis not present

## 2019-10-04 ENCOUNTER — Other Ambulatory Visit: Payer: Self-pay

## 2019-10-04 DIAGNOSIS — N186 End stage renal disease: Secondary | ICD-10-CM | POA: Diagnosis not present

## 2019-10-04 DIAGNOSIS — R498 Other voice and resonance disorders: Secondary | ICD-10-CM | POA: Diagnosis not present

## 2019-10-04 DIAGNOSIS — Z992 Dependence on renal dialysis: Secondary | ICD-10-CM | POA: Diagnosis not present

## 2019-10-04 DIAGNOSIS — E1129 Type 2 diabetes mellitus with other diabetic kidney complication: Secondary | ICD-10-CM | POA: Diagnosis not present

## 2019-10-04 DIAGNOSIS — M25571 Pain in right ankle and joints of right foot: Secondary | ICD-10-CM | POA: Diagnosis not present

## 2019-10-04 DIAGNOSIS — M6281 Muscle weakness (generalized): Secondary | ICD-10-CM | POA: Diagnosis not present

## 2019-10-05 DIAGNOSIS — N2581 Secondary hyperparathyroidism of renal origin: Secondary | ICD-10-CM | POA: Diagnosis not present

## 2019-10-05 DIAGNOSIS — E211 Secondary hyperparathyroidism, not elsewhere classified: Secondary | ICD-10-CM | POA: Diagnosis not present

## 2019-10-05 DIAGNOSIS — Z992 Dependence on renal dialysis: Secondary | ICD-10-CM | POA: Diagnosis not present

## 2019-10-05 DIAGNOSIS — N186 End stage renal disease: Secondary | ICD-10-CM | POA: Diagnosis not present

## 2019-10-05 DIAGNOSIS — E1122 Type 2 diabetes mellitus with diabetic chronic kidney disease: Secondary | ICD-10-CM | POA: Diagnosis not present

## 2019-10-06 ENCOUNTER — Other Ambulatory Visit: Payer: Self-pay

## 2019-10-06 ENCOUNTER — Ambulatory Visit (INDEPENDENT_AMBULATORY_CARE_PROVIDER_SITE_OTHER): Payer: Medicare Other | Admitting: Physician Assistant

## 2019-10-06 ENCOUNTER — Ambulatory Visit (HOSPITAL_COMMUNITY)
Admission: RE | Admit: 2019-10-06 | Discharge: 2019-10-06 | Disposition: A | Discharge status: Home / Self Care | Payer: Medicare Other | Source: Ambulatory Visit | Attending: Family | Admitting: Family

## 2019-10-06 VITALS — BP 150/66 | HR 60 | Temp 97.7°F | Resp 14 | Ht 65.0 in | Wt 136.0 lb

## 2019-10-06 DIAGNOSIS — N186 End stage renal disease: Secondary | ICD-10-CM | POA: Diagnosis not present

## 2019-10-06 DIAGNOSIS — R498 Other voice and resonance disorders: Secondary | ICD-10-CM | POA: Diagnosis not present

## 2019-10-06 DIAGNOSIS — R198 Other specified symptoms and signs involving the digestive system and abdomen: Secondary | ICD-10-CM | POA: Diagnosis not present

## 2019-10-06 DIAGNOSIS — M25571 Pain in right ankle and joints of right foot: Secondary | ICD-10-CM | POA: Diagnosis not present

## 2019-10-06 DIAGNOSIS — T82590A Other mechanical complication of surgically created arteriovenous fistula, initial encounter: Secondary | ICD-10-CM | POA: Diagnosis not present

## 2019-10-06 DIAGNOSIS — I7389 Other specified peripheral vascular diseases: Secondary | ICD-10-CM | POA: Diagnosis not present

## 2019-10-06 DIAGNOSIS — E1129 Type 2 diabetes mellitus with other diabetic kidney complication: Secondary | ICD-10-CM | POA: Diagnosis not present

## 2019-10-06 DIAGNOSIS — M6281 Muscle weakness (generalized): Secondary | ICD-10-CM | POA: Diagnosis not present

## 2019-10-06 DIAGNOSIS — F39 Unspecified mood [affective] disorder: Secondary | ICD-10-CM | POA: Diagnosis not present

## 2019-10-06 DIAGNOSIS — M940 Chondrocostal junction syndrome [Tietze]: Secondary | ICD-10-CM | POA: Diagnosis not present

## 2019-10-06 DIAGNOSIS — U071 COVID-19: Secondary | ICD-10-CM | POA: Diagnosis not present

## 2019-10-06 DIAGNOSIS — Z992 Dependence on renal dialysis: Secondary | ICD-10-CM | POA: Diagnosis not present

## 2019-10-06 DIAGNOSIS — I951 Orthostatic hypotension: Secondary | ICD-10-CM | POA: Diagnosis not present

## 2019-10-06 DIAGNOSIS — J9610 Chronic respiratory failure, unspecified whether with hypoxia or hypercapnia: Secondary | ICD-10-CM | POA: Diagnosis not present

## 2019-10-06 DIAGNOSIS — I480 Paroxysmal atrial fibrillation: Secondary | ICD-10-CM | POA: Diagnosis not present

## 2019-10-06 NOTE — Progress Notes (Signed)
HISTORY AND PHYSICAL     CC:  dialysis access Requesting Provider:  [place], [place]  HPI: This is a 83 y.o. female here for evaluation for hemodialysis access.  She has had multiple procedures (see below).  She states sometime in the past few weeks (she is unsure exactly when), that her graft stopped working.  She was sent to [facility] and they were able to get it working again.  She states that the doctor told her he used a balloon that was one size bigger than what Dr. Scot Dock had used last.  She states that the graft is working now but they have trouble sticking the inside of the graft sometimes.  Sometimes she gets stuck several times and sometimes she has fresh blood on the bandage when she removes it, which is different than it used to be.    She dialyzes M/W/F at [facility] location on [address].   She resides at [facility].  She was positive for covid19 in May.   Access Procedures: (from most recent to past) 03/29/2019:  venoplasty right brachial vein 76m x 8cm balloon and 7 x 6 balloon 12/24/2018: venoplasty of 2 stenoses within outflow branch of brachial vein by CSD 08/27/18: ligation of LUA AVG by CSD 06/08/18:  New right FA AVG by CSD 03/19/18: venoplasty of left [place] vein and left Brachial vein with drug coated balloon angioplasty of left [place] vein by CSD 01/19/18: angioplasty left innominate vein & left  vein by WB 04/12/15: new LUA AVG and removal of old FA AVG by CSD 03/20/15:  Ligation of left RC AVF and placement of left FA AVG by CSD 02/12/15:  fistulogram left RC AVFwith venoplasty of left cephalic vein by CSD 156/43/32left RC AVF by CSD   The pt is not on a statin for cholesterol management.  The pt is not diabetic.   The pt is on BB for hypertension.   Tobacco hx:  never The pt is on a daily aspirin. Other AC:  none  Past Medical History:  Diagnosis Date  . Anemia, chronic disease   . Anxiety   . Arthritis    knees  . Atrial fibrillation ([place]   . Barrett  esophagus   . Benign paroxysmal positional vertigo   . Breast cancer ([place] 1980s   bil mastectomies, no radiation or chemo  . Cataract   . Chest pain, atypical 12/05/2008   R/Lmv- normal perfusion all regions, noe ECG changes   . CHF (congestive heart failure) ([place] 05/13/2011   echo - ERJ>18% stage 1 diastolic dysfunction; elevated LV filling pressure, MAC  . CKD (chronic kidney disease), stage III ([place]   . Claudication ([place] 10/30/2005   doppler - normal evaluation, no evidence of aneurysm, diameter reduction, dissection, compression or vascular abnormality)  . Complication of anesthesia   . Controlled type 2 diabetes mellitus with diabetic polyneuropathy, with long-term current use of insulin ([place] 04/17/2016  . Dyslipidemia   . Dysrhythmia    Atrial Fibrillation  . Edema   . ESRD (end stage renal disease) on dialysis ([facility]    "[facility]; [address]; MWF" (07/06/2018)  . Family history of adverse reaction to anesthesia    son also has nausea   . Fever blister 12/2016  . Gastritis and gastroduodenitis   . GERD (gastroesophageal reflux disease)   . Gout   . Hemodialysis-associated hypotension   . HLD (hyperlipidemia)   . Hypertension   . IBS (irritable bowel syndrome)   . Insomnia   .  Iron deficiency anemia   . Junctional bradycardia 08/25/2018  . LFT elevation   . Long term current use of amiodarone 10/17/2016  . Morbid (severe) obesity due to excess calories ([place]) 04/29/2017  . Morbid obesity ([place])   . On home oxygen therapy    "2L; 24/7" (07/06/2018)  . PAF (paroxysmal atrial fibrillation) ([place])   . Peripheral neuropathy   . Pneumonia 12/2016  . PONV (postoperative nausea and vomiting)   . Renal cyst   . Renal insufficiency 10/30/2005   doppler - abn resistance consistent w/ parenchymal disease  . Renovascular hypertension   . Type 2 diabetes mellitus with diabetic neuropathy, with long-term current use of insulin ([place])   . Vertigo     Past Surgical History:  Procedure  Laterality Date  . A/V FISTULAGRAM Left 03/19/2018   Procedure: A/V FISTULAGRAM;  Surgeon: Angelia Mould, MD;  Location: [place] [facility];  Service: Cardiovascular;  Laterality: Left;  . A/V FISTULAGRAM N/A 12/24/2018   Procedure: A/V FISTULAGRAM - Right Arm;  Surgeon: Angelia Mould, MD;  Location: [place] [facility];  Service: Cardiovascular;  Laterality: N/A;  . A/V FISTULAGRAM Right 03/29/2019   Procedure: A/V FISTULAGRAM;  Surgeon: Angelia Mould, MD;  Location: [place] [facility];  Service: Cardiovascular;  Laterality: Right;  . A/V SHUNTOGRAM Left 01/19/2018   Procedure: A/V SHUNTOGRAM;  Surgeon: Serafina Mitchell, MD;  Location: [place] [facility];  Service: Cardiovascular;  Laterality: Left;  . ANGIOPLASTY Left 02/12/2015   Procedure: ANGIOPLASTY;  Surgeon: Angelia Mould, MD;  Location: [facility] CATH LAB;  Service: Cardiovascular;  Laterality: Left;  AVF  . AV FISTULA PLACEMENT Left 11/30/2014   Procedure: ARTERIOVENOUS (AV) FISTULA CREATION LEFT ARM;  Surgeon: Angelia Mould, MD;  Location: [place];  Service: Vascular;  Laterality: Left;  . AV FISTULA PLACEMENT Left 03/20/2015   Procedure:  Inserton of Left Upper Arm Gortex Graft;  Surgeon: Angelia Mould, MD;  Location: [place];  Service: Vascular;  Laterality: Left;  . AV FISTULA PLACEMENT Left 04/12/2015   Procedure: INSERTION OF LEFT ARM  ARTERIOVENOUS GORE-TEX GRAFT ;  Surgeon: Angelia Mould, MD;  Location: [place];  Service: Vascular;  Laterality: Left;  . AV FISTULA PLACEMENT Right 06/08/2018   Procedure: INSERTION OF ARTERIOVENOUS (AV) GORE-TEX GRAFT ARM USING 4-7MM X 45CM GORETEX GRAFT;  Surgeon: Angelia Mould, MD;  Location: [place];  Service: Vascular;  Laterality: Right;  . BACK SURGERY  ~1980  . CATARACT EXTRACTION Right   . CHOLECYSTECTOMY    . ESOPHAGOGASTRODUODENOSCOPY N/A 10/24/2014   Procedure: ESOPHAGOGASTRODUODENOSCOPY (EGD);  Surgeon: Jerene Bears, MD;  Location: [facility]  ENDOSCOPY;  Service: Endoscopy;  Laterality: N/A;  . EXCHANGE OF A DIALYSIS CATHETER Left 11/30/2014   Procedure: EXCHANGE OF A DIALYSIS CATHETER, LEFT INTERNAL JUGULAR;  Surgeon: Angelia Mould, MD;  Location: [place];  Service: Vascular;  Laterality: Left;  . EYE SURGERY Left    transplant  . FISTULOGRAM N/A 02/12/2015   Procedure: FISTULOGRAM;  Surgeon: Angelia Mould, MD;  Location: [facility] CATH LAB;  Service: Cardiovascular;  Laterality: N/A;  . HEMORRHOID SURGERY    . IR AV DIALY SHUNT INTRO NEEDLE/INTRACATH INITIAL W/PTA/IMG RIGHT Right 08/25/2018  . IR AV DIALY SHUNT INTRO NEEDLE/INTRACATH INITIAL W/PTA/IMG RIGHT Right 11/17/2018  . IR US GUIDE VASC ACCESS RIGHT  08/25/2018  . LIGATION ARTERIOVENOUS GORTEX GRAFT Left 08/27/2018   Procedure: LIGATION ARTERIOVENOUS GORTEX GRAFT ARM;  Surgeon: Angelia Mould, MD;  Location: [facility];  Service: Vascular;  Laterality: Left;  . LIGATION OF ARTERIOVENOUS  FISTULA Left 03/20/2015   Procedure: LIGATION OF ARTERIOVENOUS  FISTULA;  Surgeon: Angelia Mould, MD;  Location: [place];  Service: Vascular;  Laterality: Left;  Marland Kitchen MASTECTOMY Bilateral ~1982   bil breast reconstruction with implants  . ORIF TIBIA & FIBULA FRACTURES Left 2007   also had left non displaced malleolar fracture.   Marland Kitchen PERIPHERAL VASCULAR BALLOON ANGIOPLASTY Left 01/19/2018   Procedure: PERIPHERAL VASCULAR BALLOON ANGIOPLASTY;  Surgeon: Serafina Mitchell, MD;  Location: [place] [facility] LAB;  Service: Cardiovascular;  Laterality: Left;  fistula  . PERIPHERAL VASCULAR BALLOON ANGIOPLASTY Left 03/19/2018   Procedure: PERIPHERAL VASCULAR BALLOON ANGIOPLASTY;  Surgeon: Angelia Mould, MD;  Location: [place] [facility];  Service: Cardiovascular;  Laterality: Left;  UPPER ARM FISTULA  . PERIPHERAL VASCULAR BALLOON ANGIOPLASTY Right 12/24/2018   Procedure: PERIPHERAL VASCULAR BALLOON ANGIOPLASTY;  Surgeon: Angelia Mould, MD;  Location: [place] [facility];  Service:  Cardiovascular;  Laterality: Right;  arm fistula  . PERIPHERAL VASCULAR BALLOON ANGIOPLASTY  03/29/2019   Procedure: PERIPHERAL VASCULAR BALLOON ANGIOPLASTY;  Surgeon: Angelia Mould, MD;  Location: [place] [facility];  Service: Cardiovascular;;  . REMOVAL OF GRAFT Left 04/12/2015   Procedure: REMOVAL OF LEFT ARM ARTERIOVENOUS GORE-TEX GRAFT;  Surgeon: Angelia Mould, MD;  Location: [place];  Service: Vascular;  Laterality: Left;    Allergies  Allergen Reactions  . Erythromycin Swelling  . Codeine Palpitations, Rash and Other (See Comments)    GI Upset  . Erythromycin Base Other (See Comments)    GI Upset  . Penicillin G Rash    Has patient had a PCN reaction causing immediate rash, facial/tongue/throat swelling, SOB or lightheadedness with hypotension: Yes Has patient had a PCN reaction causing severe rash involving mucus membranes or skin necrosis: Unk Has patient had a PCN reaction that required hospitalization: Unk Has patient had a PCN reaction occurring within the last 10 years: Unk If all of the above answers are "NO", then may proceed with Cephalosporin use..  . Penicillins Rash and Other (See Comments)    Has taken Keflex & Rocephin many times without problems Has patient had a PCN reaction causing immediate rash, facial/tongue/throat swelling, SOB or lightheadedness with hypotension: Yes Has patient had a PCN reaction causing severe rash involving mucus membranes or skin necrosis: Unk Has patient had a PCN reaction that required hospitalization: Unk Has patient had a PCN reaction occurring within the last 10 years: Unk If all of the above answers are "NO", then may proceed with Cephalosporin use.   Keturah Shavers [Ranitidine Hcl] Nausea And Vomiting    Current Outpatient Medications  Medication Sig Dispense Refill  . acetaminophen (TYLENOL) 325 MG tablet Take 650 mg by mouth every 6 (six) hours as needed for fever.     Marland Kitchen allopurinol (ZYLOPRIM) 100 MG tablet Take 100 mg  by mouth daily. (0900)    . aspirin 81 MG chewable tablet Chew 81 mg by mouth daily. (0900)    . B Complex-C-Folic Acid (NEPHRO VITAMINS) 0.8 MG TABS Take 1 tablet by mouth daily. (0900)    . Brinzolamide-Brimonidine (SIMBRINZA) 1-0.2 % SUSP Place 1 drop into both eyes 2 (two) times daily. (0900 & 2100)    . calcium acetate (PHOSLO) 667 MG capsule Take 1,334 mg by mouth 3 (three) times daily with meals.    . diazepam (VALIUM) 5 MG tablet Take 1 tablet (5 mg  total) by mouth at bedtime. (Patient taking differently: Take 5 mg by mouth at bedtime. (2100)) 5 tablet 0  . diclofenac sodium (VOLTAREN) 1 % GEL Apply 4 g topically 4 (four) times daily. Applied to right knee    . DIMETHICONE, TOPICAL, 5 % CREA Apply 1 application topically See admin instructions. Place a nickel-sized amount onto buttock(s) area 2 times a day for barrier protection    . Lidocaine (ASPERCREME LIDOCAINE) 4 % PTCH Place 1 patch onto the skin daily. Applied to right knee (on at 0800 & off at 2000)    . loperamide (IMODIUM A-D) 2 MG tablet Take 2 tablets (4 mg total) by mouth every 8 (eight) hours as needed for diarrhea or loose stools (for diarrhea). 50 tablet 2  . loratadine (CLARITIN) 10 MG tablet Take 10 mg by mouth daily. (0900)    . meclizine (ANTIVERT) 25 MG tablet Take 25 mg by mouth every 8 (eight) hours as needed for dizziness (vertigo).     . Melatonin 3 MG TABS Take 6 mg by mouth at bedtime.     . metoprolol tartrate (LOPRESSOR) 50 MG tablet Take 1 tablet (50 mg total) by mouth 2 (two) times daily. Per ER dosing 180 tablet 3  . midodrine (PROAMATINE) 10 MG tablet Take 10 mg by mouth See admin instructions. Take 10 mg by mouth WITH MORNING MEDS AT FACILITY on Mon/Wed/Fri- dialysis days Take 10 mg by mouth at dialysis on Mon/Wed/Fri    . midodrine (PROAMATINE) 2.5 MG tablet Take 2.5 mg by mouth See admin instructions. 3 times daily on Sun Tues Thurs and Sat , and 2 times daily on Mon Wed and Fri after Dialysis    . OXYGEN  Inhale 2 L into the lungs continuous.     . promethazine (PHENERGAN) 25 MG tablet Take 1 tablet (25 mg total) by mouth every 8 (eight) hours as needed for nausea or vomiting. 50 tablet 1  . traZODone (DESYREL) 50 MG tablet Take 25 mg by mouth at bedtime. (2100)     No current facility-administered medications for this visit.     Family History  Problem Relation Age of Onset  . Diabetes Mother   . Heart disease Mother   . Non-Hodgkin's lymphoma Mother   . Heart attack Father   . Stroke Father   . Parkinson's disease Brother   . Lung disease Brother   . Heart disease Brother   . Multiple myeloma Sister     Social History   Socioeconomic History  . Marital status: Divorced    Spouse name: Not on file  . Number of children: Not on file  . Years of education: Not on file  . Highest education level: Not on file  Occupational History  . Not on file  Social Needs  . Financial resource strain: Not on file  . Food insecurity    Worry: Not on file    Inability: Not on file  . Transportation needs    Medical: Not on file    Non-medical: Not on file  Tobacco Use  . Smoking status: Never Smoker  . Smokeless tobacco: Never Used  Substance and Sexual Activity  . Alcohol use: No  . Drug use: No  . Sexual activity: Not on file  Lifestyle  . Physical activity    Days per week: Not on file    Minutes per session: Not on file  . Stress: Not on file  Relationships  . Social connections  Talks on phone: Not on file    Gets together: Not on file    Attends religious service: Not on file    Active member of club or organization: Not on file    Attends meetings of clubs or organizations: Not on file    Relationship status: Not on file  . Intimate partner violence    Fear of current or ex partner: Not on file    Emotionally abused: Not on file    Physically abused: Not on file    Forced sexual activity: Not on file  Other Topics Concern  . Not on file  Social History Narrative    [facility] (10/06/17):   Originally from [facility]. Has always lived in [place]. She currently lives in [place]. No bird exposure. [place] Previously traveled to [place], [place], & [place]. Previously worked in a nursery and also a Pharmacist, community.      ROS: _0  Positive   _1  Negative   _2  All sytems reviewed and are negative  Cardiac: _3  high cholesterol _4  heart failure  Vascular: _5  swelling in legs  Pulmonary: _6  fluid around the left lung  Neurologic: _7  weakness in _8  arms _9  legs _10  numbness in _11  arms _12  legs _13 difficulty speaking or slurred speech _14  temporary loss of vision in one eye _15  dizziness  Hematologic: _16  bleeding problems  GI _17  hx melena and stomach problems followed by GI  GU: _18  CKD/renal failure  _19  HD---_20  M/W/F _21  T/T/S  Psychiatric: _22  anxiety  Integumentary: _23  bruising around graft  Constitutional: _24  fever _25  chills  PHYSICAL EXAMINATION:  Today's Vitals   10/06/19 1446  BP: (!) 150/66  Pulse: 60  Resp: 14  Temp: 97.7 F (36.5 C)  TempSrc: Temporal  SpO2: 98%  Weight: 136 lb (61.7 kg)  Height: _26  (1.651 m)  PainSc: 2    Body mass index is 22.63 kg/m.    General:  WDWN female in NAD Gait: Not observed HENT: WNL Pulmonary: normal non-labored breathing , without Rales, rhonchi,  wheezing Cardiac: regular Skin: bruising around right FA AVG Vascular Exam/Pulses:  +thrill/bruit within fistula and is easily palpable Extremities:  Radial arteries are not palpable bilaterally Musculoskeletal: no muscle wasting or atrophy  Neurologic: A&O X 3; Moving all extremities equally;  Speech is fluent/normal  Non-Invasive Vascular Imaging:   Dialysis duplex on 10/06/2019: Wall thickening noted in the brachial vein. Localized non occlusive thrombus noted in the venous portion of the graft mid forearm.   ASSESSMENT/PLAN: 83 y.o. female with ESRD here for evaluation of her right FA AVG   -pt has had multiple  procedures on access of right FA AVG with most recent being done in the past few weeks at [facility].  There is a thrill and bruit within the fistula. -discussed with Dr. Scot Dock about pt and her duplex from today.  He feels that if she continues to have problems with her graft, we can repeat the fistulogram and may possibly need new access in the future, which would most likely be a RUA AVG.  -discussed with pt that should she have further issues, she will call to schedule appt with Dr. Scot Dock.  Leontine Locket, PA-C [facility] [phone]  Clinic MD:   Scot Dock

## 2019-10-07 DIAGNOSIS — Z992 Dependence on renal dialysis: Secondary | ICD-10-CM | POA: Diagnosis not present

## 2019-10-07 DIAGNOSIS — N186 End stage renal disease: Secondary | ICD-10-CM | POA: Diagnosis not present

## 2019-10-07 DIAGNOSIS — E1122 Type 2 diabetes mellitus with diabetic chronic kidney disease: Secondary | ICD-10-CM | POA: Diagnosis not present

## 2019-10-07 DIAGNOSIS — E211 Secondary hyperparathyroidism, not elsewhere classified: Secondary | ICD-10-CM | POA: Diagnosis not present

## 2019-10-07 DIAGNOSIS — N2581 Secondary hyperparathyroidism of renal origin: Secondary | ICD-10-CM | POA: Diagnosis not present

## 2019-10-10 DIAGNOSIS — E1122 Type 2 diabetes mellitus with diabetic chronic kidney disease: Secondary | ICD-10-CM | POA: Diagnosis not present

## 2019-10-10 DIAGNOSIS — N2581 Secondary hyperparathyroidism of renal origin: Secondary | ICD-10-CM | POA: Diagnosis not present

## 2019-10-10 DIAGNOSIS — N186 End stage renal disease: Secondary | ICD-10-CM | POA: Diagnosis not present

## 2019-10-10 DIAGNOSIS — E211 Secondary hyperparathyroidism, not elsewhere classified: Secondary | ICD-10-CM | POA: Diagnosis not present

## 2019-10-10 DIAGNOSIS — Z992 Dependence on renal dialysis: Secondary | ICD-10-CM | POA: Diagnosis not present

## 2019-10-11 DIAGNOSIS — M6281 Muscle weakness (generalized): Secondary | ICD-10-CM | POA: Diagnosis not present

## 2019-10-11 DIAGNOSIS — M25571 Pain in right ankle and joints of right foot: Secondary | ICD-10-CM | POA: Diagnosis not present

## 2019-10-11 DIAGNOSIS — E1129 Type 2 diabetes mellitus with other diabetic kidney complication: Secondary | ICD-10-CM | POA: Diagnosis not present

## 2019-10-11 DIAGNOSIS — R498 Other voice and resonance disorders: Secondary | ICD-10-CM | POA: Diagnosis not present

## 2019-10-11 DIAGNOSIS — N186 End stage renal disease: Secondary | ICD-10-CM | POA: Diagnosis not present

## 2019-10-11 DIAGNOSIS — Z992 Dependence on renal dialysis: Secondary | ICD-10-CM | POA: Diagnosis not present

## 2019-10-12 DIAGNOSIS — E1122 Type 2 diabetes mellitus with diabetic chronic kidney disease: Secondary | ICD-10-CM | POA: Diagnosis not present

## 2019-10-12 DIAGNOSIS — E211 Secondary hyperparathyroidism, not elsewhere classified: Secondary | ICD-10-CM | POA: Diagnosis not present

## 2019-10-12 DIAGNOSIS — U071 COVID-19: Secondary | ICD-10-CM | POA: Diagnosis not present

## 2019-10-12 DIAGNOSIS — N2581 Secondary hyperparathyroidism of renal origin: Secondary | ICD-10-CM | POA: Diagnosis not present

## 2019-10-12 DIAGNOSIS — N186 End stage renal disease: Secondary | ICD-10-CM | POA: Diagnosis not present

## 2019-10-12 DIAGNOSIS — Z992 Dependence on renal dialysis: Secondary | ICD-10-CM | POA: Diagnosis not present

## 2019-10-13 ENCOUNTER — Other Ambulatory Visit: Payer: Self-pay

## 2019-10-13 ENCOUNTER — Ambulatory Visit (INDEPENDENT_AMBULATORY_CARE_PROVIDER_SITE_OTHER): Payer: Medicare Other | Admitting: Physician Assistant

## 2019-10-13 ENCOUNTER — Encounter: Payer: Self-pay | Admitting: Physician Assistant

## 2019-10-13 VITALS — HR 56 | Temp 97.0°F

## 2019-10-13 DIAGNOSIS — R195 Other fecal abnormalities: Secondary | ICD-10-CM

## 2019-10-13 DIAGNOSIS — R498 Other voice and resonance disorders: Secondary | ICD-10-CM | POA: Diagnosis not present

## 2019-10-13 DIAGNOSIS — M6281 Muscle weakness (generalized): Secondary | ICD-10-CM | POA: Diagnosis not present

## 2019-10-13 DIAGNOSIS — M25571 Pain in right ankle and joints of right foot: Secondary | ICD-10-CM | POA: Diagnosis not present

## 2019-10-13 DIAGNOSIS — N186 End stage renal disease: Secondary | ICD-10-CM | POA: Diagnosis not present

## 2019-10-13 DIAGNOSIS — E1129 Type 2 diabetes mellitus with other diabetic kidney complication: Secondary | ICD-10-CM | POA: Diagnosis not present

## 2019-10-13 DIAGNOSIS — Z992 Dependence on renal dialysis: Secondary | ICD-10-CM | POA: Diagnosis not present

## 2019-10-13 MED ORDER — CHOLESTYRAMINE 4 G PO PACK: 4.0000 g | PACK | Freq: Two times a day (BID) | ORAL | 0 refills | Status: DC

## 2019-10-13 NOTE — Progress Notes (Signed)
Chief Complaint: Loose stools  HPI:    [name] James is an 83 year old female, known to Dr. Hilarie Fredrickson, with a past medical history as listed below including A. fib, GERD, Barrett's esophagus, gastritis, ESRD on hemodialysis (MWF), COPD on oxygen remote breast cancer, who returns to clinic today for follow-up of her loose stools.    03/01/2019 patient seen by Dr. Hilarie Fredrickson and continue to struggle with loose stools.  She described this happened multiple days per week after eating.  At that time patient requested a CT scan was also felt that based on her overall comorbidity that upper endoscopy or colonoscopy were not warranted.  She was continued on Imodium but increased to 4 mg every 8 hours as needed for diarrhea/loose stool.  Continued Phenergan 25 mg every 8 hours.  She was offered cholestyramine but the patient preferred to stick with Imodium.    Today, the patient presents clinic and is very hard to follow.  She is a poor historian with little details about her actual symptoms.  Explains that she continues with loose stools which she tells me can happen any day, at any time.  She only has 1 or 2 at the most it sounds, does use Imodium 4 mg at least once every day.  No real change in symptoms.  She is asking about Cholestyramine which was discussed at patient's last visit.    Denies fever, chills, blood in her stool or weight loss.  Past Medical History:  Diagnosis Date  . Anemia, chronic disease   . Anxiety   . Arthritis    knees  . Atrial fibrillation ([place])   . Barrett esophagus   . Benign paroxysmal positional vertigo   . Breast cancer ([place]) 1980s   bil mastectomies, no radiation or chemo  . Cataract   . Chest pain, atypical 12/05/2008   R/Lmv- normal perfusion all regions, noe ECG changes   . CHF (congestive heart failure) ([place]) 05/13/2011   echo - ZO>10%; stage 1 diastolic dysfunction; elevated LV filling pressure, MAC  . CKD (chronic kidney disease), stage III   . Claudication ([place])  10/30/2005   doppler - normal evaluation, no evidence of aneurysm, diameter reduction, dissection, compression or vascular abnormality)  . Complication of anesthesia   . Controlled type 2 diabetes mellitus with diabetic polyneuropathy, with long-term current use of insulin ([place]) 04/17/2016  . Dyslipidemia   . Dysrhythmia    Atrial Fibrillation  . Edema   . ESRD (end stage renal disease) on dialysis ([place])    "[facility]; [address]; MWF" (07/06/2018)  . Family history of adverse reaction to anesthesia    son also has nausea   . Fever blister 12/2016  . Gastritis and gastroduodenitis   . GERD (gastroesophageal reflux disease)   . Gout   . Hemodialysis-associated hypotension   . HLD (hyperlipidemia)   . Hypertension   . IBS (irritable bowel syndrome)   . Insomnia   . Iron deficiency anemia   . Junctional bradycardia 08/25/2018  . LFT elevation   . Long term current use of amiodarone 10/17/2016  . Morbid (severe) obesity due to excess calories ([place]) 04/29/2017  . Morbid obesity ([place])   . On home oxygen therapy    "2L; 24/7" (07/06/2018)  . PAF (paroxysmal atrial fibrillation) ([place])   . Peripheral neuropathy   . Pneumonia 12/2016  . PONV (postoperative nausea and vomiting)   . Renal cyst   . Renal insufficiency 10/30/2005   doppler - abn resistance consistent w/ parenchymal  disease  . Renovascular hypertension   . Type 2 diabetes mellitus with diabetic neuropathy, with long-term current use of insulin ([place])   . Vertigo     Past Surgical History:  Procedure Laterality Date  . A/V FISTULAGRAM Left 03/19/2018   Procedure: A/V FISTULAGRAM;  Surgeon: Angelia Mould, MD;  Location: [place] [facility];  Service: Cardiovascular;  Laterality: Left;  . A/V FISTULAGRAM N/A 12/24/2018   Procedure: A/V FISTULAGRAM - Right Arm;  Surgeon: Angelia Mould, MD;  Location: [place] [facility];  Service: Cardiovascular;  Laterality: N/A;  . A/V FISTULAGRAM Right 03/29/2019   Procedure: A/V  FISTULAGRAM;  Surgeon: Angelia Mould, MD;  Location: [place] [facility];  Service: Cardiovascular;  Laterality: Right;  . A/V SHUNTOGRAM Left 01/19/2018   Procedure: A/V SHUNTOGRAM;  Surgeon: Serafina Mitchell, MD;  Location: [facility];  Service: Cardiovascular;  Laterality: Left;  . ANGIOPLASTY Left 02/12/2015   Procedure: ANGIOPLASTY;  Surgeon: Angelia Mould, MD;  Location: [facility] CATH LAB;  Service: Cardiovascular;  Laterality: Left;  AVF  . AV FISTULA PLACEMENT Left 11/30/2014   Procedure: ARTERIOVENOUS (AV) FISTULA CREATION LEFT ARM;  Surgeon: Angelia Mould, MD;  Location: [place];  Service: Vascular;  Laterality: Left;  . AV FISTULA PLACEMENT Left 03/20/2015   Procedure:  Inserton of Left Upper Arm Gortex Graft;  Surgeon: Angelia Mould, MD;  Location: [place];  Service: Vascular;  Laterality: Left;  . AV FISTULA PLACEMENT Left 04/12/2015   Procedure: INSERTION OF LEFT ARM  ARTERIOVENOUS GORE-TEX GRAFT ;  Surgeon: Angelia Mould, MD;  Location: [place];  Service: Vascular;  Laterality: Left;  . AV FISTULA PLACEMENT Right 06/08/2018   Procedure: INSERTION OF ARTERIOVENOUS (AV) GORE-TEX GRAFT ARM USING 4-7MM X 45CM GORETEX GRAFT;  Surgeon: Angelia Mould, MD;  Location: [place];  Service: Vascular;  Laterality: Right;  . BACK SURGERY  ~1980  . CATARACT EXTRACTION Right   . CHOLECYSTECTOMY    . ESOPHAGOGASTRODUODENOSCOPY N/A 10/24/2014   Procedure: ESOPHAGOGASTRODUODENOSCOPY (EGD);  Surgeon: Jerene Bears, MD;  Location: [facility] ENDOSCOPY;  Service: Endoscopy;  Laterality: N/A;  . EXCHANGE OF A DIALYSIS CATHETER Left 11/30/2014   Procedure: EXCHANGE OF A DIALYSIS CATHETER, LEFT INTERNAL JUGULAR;  Surgeon: Angelia Mould, MD;  Location: [place];  Service: Vascular;  Laterality: Left;  . EYE SURGERY Left    transplant  . FISTULOGRAM N/A 02/12/2015   Procedure: FISTULOGRAM;  Surgeon: Angelia Mould, MD;  Location: [facility] CATH LAB;  Service: Cardiovascular;   Laterality: N/A;  . HEMORRHOID SURGERY    . IR AV DIALY SHUNT INTRO NEEDLE/INTRACATH INITIAL W/PTA/IMG RIGHT Right 08/25/2018  . IR AV DIALY SHUNT INTRO NEEDLE/INTRACATH INITIAL W/PTA/IMG RIGHT Right 11/17/2018  . IR US GUIDE VASC ACCESS RIGHT  08/25/2018  . LIGATION ARTERIOVENOUS GORTEX GRAFT Left 08/27/2018   Procedure: LIGATION ARTERIOVENOUS GORTEX GRAFT ARM;  Surgeon: Angelia Mould, MD;  Location: [place];  Service: Vascular;  Laterality: Left;  . LIGATION OF ARTERIOVENOUS  FISTULA Left 03/20/2015   Procedure: LIGATION OF ARTERIOVENOUS  FISTULA;  Surgeon: Angelia Mould, MD;  Location: [facility];  Service: Vascular;  Laterality: Left;  Marland Kitchen MASTECTOMY Bilateral ~1982   bil breast reconstruction with implants  . ORIF TIBIA & FIBULA FRACTURES Left 2007   also had left non displaced malleolar fracture.   Marland Kitchen PERIPHERAL VASCULAR BALLOON ANGIOPLASTY Left 01/19/2018   Procedure: PERIPHERAL VASCULAR BALLOON ANGIOPLASTY;  Surgeon: Serafina Mitchell, MD;  Location: [place] [facility];  Service: Cardiovascular;  Laterality: Left;  fistula  . PERIPHERAL VASCULAR BALLOON ANGIOPLASTY Left 03/19/2018   Procedure: PERIPHERAL VASCULAR BALLOON ANGIOPLASTY;  Surgeon: Angelia Mould, MD;  Location: [facility];  Service: Cardiovascular;  Laterality: Left;  UPPER ARM FISTULA  . PERIPHERAL VASCULAR BALLOON ANGIOPLASTY Right 12/24/2018   Procedure: PERIPHERAL VASCULAR BALLOON ANGIOPLASTY;  Surgeon: Angelia Mould, MD;  Location: [facility];  Service: Cardiovascular;  Laterality: Right;  arm fistula  . PERIPHERAL VASCULAR BALLOON ANGIOPLASTY  03/29/2019   Procedure: PERIPHERAL VASCULAR BALLOON ANGIOPLASTY;  Surgeon: Angelia Mould, MD;  Location: [facility] [facility];  Service: Cardiovascular;;  . REMOVAL OF GRAFT Left 04/12/2015   Procedure: REMOVAL OF LEFT ARM ARTERIOVENOUS GORE-TEX GRAFT;  Surgeon: Angelia Mould, MD;  Location: [facility];  Service: Vascular;  Laterality: Left;     Current Outpatient Medications  Medication Sig Dispense Refill  . acetaminophen (TYLENOL) 325 MG tablet Take 650 mg by mouth every 6 (six) hours as needed for fever.     Marland Kitchen allopurinol (ZYLOPRIM) 100 MG tablet Take 100 mg by mouth daily. (0900)    . aspirin 81 MG chewable tablet Chew 81 mg by mouth daily. (0900)    . B Complex-C-Folic Acid (NEPHRO VITAMINS) 0.8 MG TABS Take 1 tablet by mouth daily. (0900)    . Brinzolamide-Brimonidine (SIMBRINZA) 1-0.2 % SUSP Place 1 drop into both eyes 2 (two) times daily. (0900 & 2100)    . calcium acetate (PHOSLO) 667 MG capsule Take 1,334 mg by mouth 3 (three) times daily with meals.    . diazepam (VALIUM) 5 MG tablet Take 1 tablet (5 mg total) by mouth at bedtime. (Patient taking differently: Take 5 mg by mouth at bedtime. (2100)) 5 tablet 0  . diclofenac sodium (VOLTAREN) 1 % GEL Apply 4 g topically 4 (four) times daily. Applied to right knee    . DIMETHICONE, TOPICAL, 5 % CREA Apply 1 application topically See admin instructions. Place a nickel-sized amount onto buttock(s) area 2 times a day for barrier protection    . Lidocaine (ASPERCREME LIDOCAINE) 4 % PTCH Place 1 patch onto the skin daily. Applied to right knee (on at 0800 & off at 2000)    . loperamide (IMODIUM A-D) 2 MG tablet Take 2 tablets (4 mg total) by mouth every 8 (eight) hours as needed for diarrhea or loose stools (for diarrhea). 50 tablet 2  . loratadine (CLARITIN) 10 MG tablet Take 10 mg by mouth daily. (0900)    . meclizine (ANTIVERT) 25 MG tablet Take 25 mg by mouth every 8 (eight) hours as needed for dizziness (vertigo).     . Melatonin 3 MG TABS Take 6 mg by mouth at bedtime.     . midodrine (PROAMATINE) 10 MG tablet Take 10 mg by mouth See admin instructions. Take 10 mg by mouth WITH MORNING MEDS AT FACILITY on Mon/Wed/Fri- dialysis days Take 10 mg by mouth at dialysis on Mon/Wed/Fri    . midodrine (PROAMATINE) 2.5 MG tablet Take 2.5 mg by mouth See admin instructions. 3 times  daily on Sun Tues Thurs and Sat , and 2 times daily on Mon Wed and Fri after Dialysis    . OXYGEN Inhale 2 L into the lungs continuous.     . promethazine (PHENERGAN) 25 MG tablet Take 1 tablet (25 mg total) by mouth every 8 (eight) hours as needed for nausea or vomiting. 50 tablet 1  . traZODone (  DESYREL) 50 MG tablet Take 25 mg by mouth at bedtime. (2100)    . metoprolol tartrate (LOPRESSOR) 50 MG tablet Take 1 tablet (50 mg total) by mouth 2 (two) times daily. Per ER dosing 180 tablet 3   No current facility-administered medications for this visit.     Allergies as of 10/13/2019 - Review Complete 10/13/2019  Allergen Reaction Noted  . Erythromycin Swelling 02/01/2013  . Codeine Palpitations, Rash, and Other (See Comments) 02/01/2013  . Erythromycin base Other (See Comments) 03/05/2016  . Penicillin g Rash 03/05/2016  . Penicillins Rash and Other (See Comments) 02/01/2013  . Zantac [ranitidine hcl] Nausea And Vomiting 04/06/2018    Family History  Problem Relation Age of Onset  . Diabetes Mother   . Heart disease Mother   . Non-Hodgkin's lymphoma Mother   . Heart attack Father   . Stroke Father   . Parkinson's disease Brother   . Lung disease Brother   . Heart disease Brother   . Multiple myeloma Sister     Social History   Socioeconomic History  . Marital status: Divorced    Spouse name: Not on file  . Number of children: Not on file  . Years of education: Not on file  . Highest education level: Not on file  Occupational History  . Not on file  Social Needs  . Financial resource strain: Not on file  . Food insecurity    Worry: Not on file    Inability: Not on file  . Transportation needs    Medical: Not on file    Non-medical: Not on file  Tobacco Use  . Smoking status: Never Smoker  . Smokeless tobacco: Never Used  Substance and Sexual Activity  . Alcohol use: No  . Drug use: No  . Sexual activity: Not on file  Lifestyle  . Physical activity    Days per  week: Not on file    Minutes per session: Not on file  . Stress: Not on file  Relationships  . Social Herbalist on phone: Not on file    Gets together: Not on file    Attends religious service: Not on file    Active member of club or organization: Not on file    Attends meetings of clubs or organizations: Not on file    Relationship status: Not on file  . Intimate partner violence    Fear of current or ex partner: Not on file    Emotionally abused: Not on file    Physically abused: Not on file    Forced sexual activity: Not on file  Other Topics Concern  . Not on file  Social History Narrative    Pulmonary (10/06/17):   Originally from [facility]. Has always lived in [place]. She currently lives in [place]. No bird exposure. [place] Previously traveled to [place], [place], & [place]. Previously worked in a nursery and also a Pharmacist, community.     Review of Systems:    Constitutional: No weight loss, fever or chills Cardiovascular: No chest pain Respiratory: No SOB  Gastrointestinal: See HPI and otherwise negative   Physical Exam:  Vital signs: Pulse (!) 56   Temp (!) 97 F (36.1 C)   Constitutional:   Pleasant Caucasian female appears to be in NAD, Well developed, Well nourished, alert and cooperative Respiratory: Respirations even and unlabored. Lungs clear to auscultation bilaterally.   No wheezes, crackles, or rhonchi.  Cardiovascular: Normal S1,  S2. No MRG. Regular rate and rhythm. No peripheral edema, cyanosis or pallor.  Gastrointestinal:  Soft, nondistended, nontender. No rebound or guardingIncreased BS all four quadrants, No appreciable masses or hepatomegaly.(limited as patient is in wheelchair) Rectal:  Not performed.  Psychiatric: Demonstrates good judgement and reason without abnormal affect or behaviors.  No recent labs or imaging.  Assessment: 1.  Loose stools: Chronic for the patient, no real change recently, likely related to IBS versus other   Plan: 1.  Prescribed Cholestyramine 4 g twice daily to be taken before breakfast and dinner.  #60 with 5 refills 2.  Continue Imodium as previously prescribed 3.  Patient to follow in clinic with Dr. Hilarie Fredrickson or myself as needed in the future.  Ellouise Newer, PA-C [facility] 10/13/2019, 11:21 AM  Cc: Place, [facility]

## 2019-10-13 NOTE — Progress Notes (Signed)
Addendum: Reviewed and agree with assessment and management plan. Milani Lowenstein M, MD  

## 2019-10-13 NOTE — Patient Instructions (Addendum)
Normal BMI (Body Mass Index- based on height and weight) is between 23 and 30. Your BMI today is There is no height or weight on file to calculate BMI. Marland Kitchen Please consider follow up  regarding your BMI with your Primary Care Provider.  We have sent the following medications to your pharmacy for you to pick up at your convenience: Cholestyramine  Continue Imodium  Follow up as needed  Thank you for choosing me and [facility].    Ellouise Newer, PA-C

## 2019-10-14 DIAGNOSIS — N2581 Secondary hyperparathyroidism of renal origin: Secondary | ICD-10-CM | POA: Diagnosis not present

## 2019-10-14 DIAGNOSIS — E211 Secondary hyperparathyroidism, not elsewhere classified: Secondary | ICD-10-CM | POA: Diagnosis not present

## 2019-10-14 DIAGNOSIS — N186 End stage renal disease: Secondary | ICD-10-CM | POA: Diagnosis not present

## 2019-10-14 DIAGNOSIS — Z992 Dependence on renal dialysis: Secondary | ICD-10-CM | POA: Diagnosis not present

## 2019-10-14 DIAGNOSIS — E1122 Type 2 diabetes mellitus with diabetic chronic kidney disease: Secondary | ICD-10-CM | POA: Diagnosis not present

## 2019-10-17 DIAGNOSIS — N186 End stage renal disease: Secondary | ICD-10-CM | POA: Diagnosis not present

## 2019-10-17 DIAGNOSIS — E1122 Type 2 diabetes mellitus with diabetic chronic kidney disease: Secondary | ICD-10-CM | POA: Diagnosis not present

## 2019-10-17 DIAGNOSIS — N2581 Secondary hyperparathyroidism of renal origin: Secondary | ICD-10-CM | POA: Diagnosis not present

## 2019-10-17 DIAGNOSIS — Z992 Dependence on renal dialysis: Secondary | ICD-10-CM | POA: Diagnosis not present

## 2019-10-17 DIAGNOSIS — E211 Secondary hyperparathyroidism, not elsewhere classified: Secondary | ICD-10-CM | POA: Diagnosis not present

## 2019-10-18 DIAGNOSIS — Z992 Dependence on renal dialysis: Secondary | ICD-10-CM | POA: Diagnosis not present

## 2019-10-18 DIAGNOSIS — N186 End stage renal disease: Secondary | ICD-10-CM | POA: Diagnosis not present

## 2019-10-18 DIAGNOSIS — U071 COVID-19: Secondary | ICD-10-CM | POA: Diagnosis not present

## 2019-10-18 DIAGNOSIS — M25571 Pain in right ankle and joints of right foot: Secondary | ICD-10-CM | POA: Diagnosis not present

## 2019-10-18 DIAGNOSIS — E1129 Type 2 diabetes mellitus with other diabetic kidney complication: Secondary | ICD-10-CM | POA: Diagnosis not present

## 2019-10-18 DIAGNOSIS — M6281 Muscle weakness (generalized): Secondary | ICD-10-CM | POA: Diagnosis not present

## 2019-10-18 DIAGNOSIS — R498 Other voice and resonance disorders: Secondary | ICD-10-CM | POA: Diagnosis not present

## 2019-10-19 DIAGNOSIS — Z992 Dependence on renal dialysis: Secondary | ICD-10-CM | POA: Diagnosis not present

## 2019-10-19 DIAGNOSIS — E1122 Type 2 diabetes mellitus with diabetic chronic kidney disease: Secondary | ICD-10-CM | POA: Diagnosis not present

## 2019-10-19 DIAGNOSIS — N2581 Secondary hyperparathyroidism of renal origin: Secondary | ICD-10-CM | POA: Diagnosis not present

## 2019-10-19 DIAGNOSIS — E211 Secondary hyperparathyroidism, not elsewhere classified: Secondary | ICD-10-CM | POA: Diagnosis not present

## 2019-10-19 DIAGNOSIS — N186 End stage renal disease: Secondary | ICD-10-CM | POA: Diagnosis not present

## 2019-10-20 DIAGNOSIS — Z992 Dependence on renal dialysis: Secondary | ICD-10-CM | POA: Diagnosis not present

## 2019-10-20 DIAGNOSIS — M25571 Pain in right ankle and joints of right foot: Secondary | ICD-10-CM | POA: Diagnosis not present

## 2019-10-20 DIAGNOSIS — E1129 Type 2 diabetes mellitus with other diabetic kidney complication: Secondary | ICD-10-CM | POA: Diagnosis not present

## 2019-10-20 DIAGNOSIS — N186 End stage renal disease: Secondary | ICD-10-CM | POA: Diagnosis not present

## 2019-10-20 DIAGNOSIS — R498 Other voice and resonance disorders: Secondary | ICD-10-CM | POA: Diagnosis not present

## 2019-10-20 DIAGNOSIS — M6281 Muscle weakness (generalized): Secondary | ICD-10-CM | POA: Diagnosis not present

## 2019-10-21 DIAGNOSIS — Z992 Dependence on renal dialysis: Secondary | ICD-10-CM | POA: Diagnosis not present

## 2019-10-21 DIAGNOSIS — N186 End stage renal disease: Secondary | ICD-10-CM | POA: Diagnosis not present

## 2019-10-21 DIAGNOSIS — N2581 Secondary hyperparathyroidism of renal origin: Secondary | ICD-10-CM | POA: Diagnosis not present

## 2019-10-21 DIAGNOSIS — E1122 Type 2 diabetes mellitus with diabetic chronic kidney disease: Secondary | ICD-10-CM | POA: Diagnosis not present

## 2019-10-21 DIAGNOSIS — E211 Secondary hyperparathyroidism, not elsewhere classified: Secondary | ICD-10-CM | POA: Diagnosis not present

## 2019-10-23 DIAGNOSIS — Z992 Dependence on renal dialysis: Secondary | ICD-10-CM | POA: Diagnosis not present

## 2019-10-23 DIAGNOSIS — E1122 Type 2 diabetes mellitus with diabetic chronic kidney disease: Secondary | ICD-10-CM | POA: Diagnosis not present

## 2019-10-23 DIAGNOSIS — N186 End stage renal disease: Secondary | ICD-10-CM | POA: Diagnosis not present

## 2019-10-24 DIAGNOSIS — Z992 Dependence on renal dialysis: Secondary | ICD-10-CM | POA: Diagnosis not present

## 2019-10-24 DIAGNOSIS — N2581 Secondary hyperparathyroidism of renal origin: Secondary | ICD-10-CM | POA: Diagnosis not present

## 2019-10-24 DIAGNOSIS — Z23 Encounter for immunization: Secondary | ICD-10-CM | POA: Diagnosis not present

## 2019-10-24 DIAGNOSIS — E1122 Type 2 diabetes mellitus with diabetic chronic kidney disease: Secondary | ICD-10-CM | POA: Diagnosis not present

## 2019-10-24 DIAGNOSIS — N186 End stage renal disease: Secondary | ICD-10-CM | POA: Diagnosis not present

## 2019-10-25 DIAGNOSIS — N186 End stage renal disease: Secondary | ICD-10-CM | POA: Diagnosis not present

## 2019-10-25 DIAGNOSIS — R293 Abnormal posture: Secondary | ICD-10-CM | POA: Diagnosis not present

## 2019-10-25 DIAGNOSIS — M6281 Muscle weakness (generalized): Secondary | ICD-10-CM | POA: Diagnosis not present

## 2019-10-25 DIAGNOSIS — J9611 Chronic respiratory failure with hypoxia: Secondary | ICD-10-CM | POA: Diagnosis not present

## 2019-10-25 DIAGNOSIS — R41841 Cognitive communication deficit: Secondary | ICD-10-CM | POA: Diagnosis not present

## 2019-10-25 DIAGNOSIS — M25571 Pain in right ankle and joints of right foot: Secondary | ICD-10-CM | POA: Diagnosis not present

## 2019-10-25 DIAGNOSIS — E1129 Type 2 diabetes mellitus with other diabetic kidney complication: Secondary | ICD-10-CM | POA: Diagnosis not present

## 2019-10-25 DIAGNOSIS — Z992 Dependence on renal dialysis: Secondary | ICD-10-CM | POA: Diagnosis not present

## 2019-10-25 DIAGNOSIS — U071 COVID-19: Secondary | ICD-10-CM | POA: Diagnosis not present

## 2019-10-25 DIAGNOSIS — R498 Other voice and resonance disorders: Secondary | ICD-10-CM | POA: Diagnosis not present

## 2019-10-26 DIAGNOSIS — Z23 Encounter for immunization: Secondary | ICD-10-CM | POA: Diagnosis not present

## 2019-10-26 DIAGNOSIS — N186 End stage renal disease: Secondary | ICD-10-CM | POA: Diagnosis not present

## 2019-10-26 DIAGNOSIS — E1122 Type 2 diabetes mellitus with diabetic chronic kidney disease: Secondary | ICD-10-CM | POA: Diagnosis not present

## 2019-10-26 DIAGNOSIS — N2581 Secondary hyperparathyroidism of renal origin: Secondary | ICD-10-CM | POA: Diagnosis not present

## 2019-10-26 DIAGNOSIS — Z992 Dependence on renal dialysis: Secondary | ICD-10-CM | POA: Diagnosis not present

## 2019-10-28 DIAGNOSIS — Z992 Dependence on renal dialysis: Secondary | ICD-10-CM | POA: Diagnosis not present

## 2019-10-28 DIAGNOSIS — E1122 Type 2 diabetes mellitus with diabetic chronic kidney disease: Secondary | ICD-10-CM | POA: Diagnosis not present

## 2019-10-28 DIAGNOSIS — N2581 Secondary hyperparathyroidism of renal origin: Secondary | ICD-10-CM | POA: Diagnosis not present

## 2019-10-28 DIAGNOSIS — Z23 Encounter for immunization: Secondary | ICD-10-CM | POA: Diagnosis not present

## 2019-10-28 DIAGNOSIS — N186 End stage renal disease: Secondary | ICD-10-CM | POA: Diagnosis not present

## 2019-10-31 DIAGNOSIS — Z23 Encounter for immunization: Secondary | ICD-10-CM | POA: Diagnosis not present

## 2019-10-31 DIAGNOSIS — N186 End stage renal disease: Secondary | ICD-10-CM | POA: Diagnosis not present

## 2019-10-31 DIAGNOSIS — E1122 Type 2 diabetes mellitus with diabetic chronic kidney disease: Secondary | ICD-10-CM | POA: Diagnosis not present

## 2019-10-31 DIAGNOSIS — Z992 Dependence on renal dialysis: Secondary | ICD-10-CM | POA: Diagnosis not present

## 2019-10-31 DIAGNOSIS — N2581 Secondary hyperparathyroidism of renal origin: Secondary | ICD-10-CM | POA: Diagnosis not present

## 2019-11-01 DIAGNOSIS — U071 COVID-19: Secondary | ICD-10-CM | POA: Diagnosis not present

## 2019-11-02 DIAGNOSIS — E1122 Type 2 diabetes mellitus with diabetic chronic kidney disease: Secondary | ICD-10-CM | POA: Diagnosis not present

## 2019-11-02 DIAGNOSIS — N186 End stage renal disease: Secondary | ICD-10-CM | POA: Diagnosis not present

## 2019-11-02 DIAGNOSIS — N2581 Secondary hyperparathyroidism of renal origin: Secondary | ICD-10-CM | POA: Diagnosis not present

## 2019-11-02 DIAGNOSIS — Z23 Encounter for immunization: Secondary | ICD-10-CM | POA: Diagnosis not present

## 2019-11-02 DIAGNOSIS — Z992 Dependence on renal dialysis: Secondary | ICD-10-CM | POA: Diagnosis not present

## 2019-11-04 DIAGNOSIS — N2581 Secondary hyperparathyroidism of renal origin: Secondary | ICD-10-CM | POA: Diagnosis not present

## 2019-11-04 DIAGNOSIS — N186 End stage renal disease: Secondary | ICD-10-CM | POA: Diagnosis not present

## 2019-11-04 DIAGNOSIS — E1122 Type 2 diabetes mellitus with diabetic chronic kidney disease: Secondary | ICD-10-CM | POA: Diagnosis not present

## 2019-11-04 DIAGNOSIS — Z992 Dependence on renal dialysis: Secondary | ICD-10-CM | POA: Diagnosis not present

## 2019-11-04 DIAGNOSIS — Z23 Encounter for immunization: Secondary | ICD-10-CM | POA: Diagnosis not present

## 2019-11-07 DIAGNOSIS — N2581 Secondary hyperparathyroidism of renal origin: Secondary | ICD-10-CM | POA: Diagnosis not present

## 2019-11-07 DIAGNOSIS — Z23 Encounter for immunization: Secondary | ICD-10-CM | POA: Diagnosis not present

## 2019-11-07 DIAGNOSIS — N186 End stage renal disease: Secondary | ICD-10-CM | POA: Diagnosis not present

## 2019-11-07 DIAGNOSIS — Z992 Dependence on renal dialysis: Secondary | ICD-10-CM | POA: Diagnosis not present

## 2019-11-07 DIAGNOSIS — E1122 Type 2 diabetes mellitus with diabetic chronic kidney disease: Secondary | ICD-10-CM | POA: Diagnosis not present

## 2019-11-07 DIAGNOSIS — U071 COVID-19: Secondary | ICD-10-CM | POA: Diagnosis not present

## 2019-11-09 DIAGNOSIS — N2581 Secondary hyperparathyroidism of renal origin: Secondary | ICD-10-CM | POA: Diagnosis not present

## 2019-11-09 DIAGNOSIS — N186 End stage renal disease: Secondary | ICD-10-CM | POA: Diagnosis not present

## 2019-11-09 DIAGNOSIS — E1122 Type 2 diabetes mellitus with diabetic chronic kidney disease: Secondary | ICD-10-CM | POA: Diagnosis not present

## 2019-11-09 DIAGNOSIS — Z992 Dependence on renal dialysis: Secondary | ICD-10-CM | POA: Diagnosis not present

## 2019-11-09 DIAGNOSIS — Z23 Encounter for immunization: Secondary | ICD-10-CM | POA: Diagnosis not present

## 2019-11-11 DIAGNOSIS — N186 End stage renal disease: Secondary | ICD-10-CM | POA: Diagnosis not present

## 2019-11-11 DIAGNOSIS — Z992 Dependence on renal dialysis: Secondary | ICD-10-CM | POA: Diagnosis not present

## 2019-11-11 DIAGNOSIS — Z23 Encounter for immunization: Secondary | ICD-10-CM | POA: Diagnosis not present

## 2019-11-11 DIAGNOSIS — E1122 Type 2 diabetes mellitus with diabetic chronic kidney disease: Secondary | ICD-10-CM | POA: Diagnosis not present

## 2019-11-11 DIAGNOSIS — N2581 Secondary hyperparathyroidism of renal origin: Secondary | ICD-10-CM | POA: Diagnosis not present

## 2019-11-13 DIAGNOSIS — Z992 Dependence on renal dialysis: Secondary | ICD-10-CM | POA: Diagnosis not present

## 2019-11-13 DIAGNOSIS — N2581 Secondary hyperparathyroidism of renal origin: Secondary | ICD-10-CM | POA: Diagnosis not present

## 2019-11-13 DIAGNOSIS — Z23 Encounter for immunization: Secondary | ICD-10-CM | POA: Diagnosis not present

## 2019-11-13 DIAGNOSIS — N186 End stage renal disease: Secondary | ICD-10-CM | POA: Diagnosis not present

## 2019-11-13 DIAGNOSIS — E1122 Type 2 diabetes mellitus with diabetic chronic kidney disease: Secondary | ICD-10-CM | POA: Diagnosis not present

## 2019-11-15 DIAGNOSIS — Z992 Dependence on renal dialysis: Secondary | ICD-10-CM | POA: Diagnosis not present

## 2019-11-15 DIAGNOSIS — N2581 Secondary hyperparathyroidism of renal origin: Secondary | ICD-10-CM | POA: Diagnosis not present

## 2019-11-15 DIAGNOSIS — N186 End stage renal disease: Secondary | ICD-10-CM | POA: Diagnosis not present

## 2019-11-15 DIAGNOSIS — E1122 Type 2 diabetes mellitus with diabetic chronic kidney disease: Secondary | ICD-10-CM | POA: Diagnosis not present

## 2019-11-15 DIAGNOSIS — I10 Essential (primary) hypertension: Secondary | ICD-10-CM | POA: Diagnosis not present

## 2019-11-15 DIAGNOSIS — Z23 Encounter for immunization: Secondary | ICD-10-CM | POA: Diagnosis not present

## 2019-11-15 DIAGNOSIS — G478 Other sleep disorders: Secondary | ICD-10-CM | POA: Diagnosis not present

## 2019-11-18 DIAGNOSIS — E1122 Type 2 diabetes mellitus with diabetic chronic kidney disease: Secondary | ICD-10-CM | POA: Diagnosis not present

## 2019-11-18 DIAGNOSIS — N186 End stage renal disease: Secondary | ICD-10-CM | POA: Diagnosis not present

## 2019-11-18 DIAGNOSIS — Z23 Encounter for immunization: Secondary | ICD-10-CM | POA: Diagnosis not present

## 2019-11-18 DIAGNOSIS — N2581 Secondary hyperparathyroidism of renal origin: Secondary | ICD-10-CM | POA: Diagnosis not present

## 2019-11-18 DIAGNOSIS — Z992 Dependence on renal dialysis: Secondary | ICD-10-CM | POA: Diagnosis not present

## 2019-11-21 DIAGNOSIS — Z992 Dependence on renal dialysis: Secondary | ICD-10-CM | POA: Diagnosis not present

## 2019-11-21 DIAGNOSIS — N186 End stage renal disease: Secondary | ICD-10-CM | POA: Diagnosis not present

## 2019-11-21 DIAGNOSIS — Z23 Encounter for immunization: Secondary | ICD-10-CM | POA: Diagnosis not present

## 2019-11-21 DIAGNOSIS — E1122 Type 2 diabetes mellitus with diabetic chronic kidney disease: Secondary | ICD-10-CM | POA: Diagnosis not present

## 2019-11-21 DIAGNOSIS — N2581 Secondary hyperparathyroidism of renal origin: Secondary | ICD-10-CM | POA: Diagnosis not present

## 2019-11-24 DIAGNOSIS — I480 Paroxysmal atrial fibrillation: Secondary | ICD-10-CM | POA: Diagnosis not present

## 2019-11-24 DIAGNOSIS — L308 Other specified dermatitis: Secondary | ICD-10-CM | POA: Diagnosis not present

## 2019-11-24 DIAGNOSIS — N186 End stage renal disease: Secondary | ICD-10-CM | POA: Diagnosis not present

## 2019-11-24 DIAGNOSIS — F39 Unspecified mood [affective] disorder: Secondary | ICD-10-CM | POA: Diagnosis not present

## 2019-11-24 DIAGNOSIS — I7389 Other specified peripheral vascular diseases: Secondary | ICD-10-CM | POA: Diagnosis not present

## 2019-11-24 DIAGNOSIS — J9611 Chronic respiratory failure with hypoxia: Secondary | ICD-10-CM | POA: Diagnosis not present

## 2019-11-24 DIAGNOSIS — Z992 Dependence on renal dialysis: Secondary | ICD-10-CM | POA: Diagnosis not present

## 2019-11-24 DIAGNOSIS — I951 Orthostatic hypotension: Secondary | ICD-10-CM | POA: Diagnosis not present

## 2019-11-24 DIAGNOSIS — I1311 Hypertensive heart and chronic kidney disease without heart failure, with stage 5 chronic kidney disease, or end stage renal disease: Secondary | ICD-10-CM | POA: Diagnosis not present

## 2019-12-06 ENCOUNTER — Ambulatory Visit (INDEPENDENT_AMBULATORY_CARE_PROVIDER_SITE_OTHER): Payer: Medicare Other | Admitting: Internal Medicine

## 2019-12-06 ENCOUNTER — Other Ambulatory Visit: Payer: Self-pay

## 2019-12-06 VITALS — Temp 96.3°F | Ht 65.0 in | Wt 134.0 lb

## 2019-12-06 DIAGNOSIS — R195 Other fecal abnormalities: Secondary | ICD-10-CM

## 2019-12-06 DIAGNOSIS — R1310 Dysphagia, unspecified: Secondary | ICD-10-CM

## 2019-12-06 DIAGNOSIS — K2270 Barrett's esophagus without dysplasia: Secondary | ICD-10-CM | POA: Diagnosis not present

## 2019-12-06 DIAGNOSIS — R110 Nausea: Secondary | ICD-10-CM | POA: Diagnosis not present

## 2019-12-06 NOTE — Patient Instructions (Addendum)
If you are age 81 or older, your body mass index should be between 23-30. Your Body mass index is 22.3 kg/m. If this is out of the aforementioned range listed, please consider follow up with your Primary Care Provider.  If you are age 11 or younger, your body mass index should be between 19-25. Your Body mass index is 22.3 kg/m. If this is out of the aformentioned range listed, please consider follow up with your Primary Care Provider. ____________________________________________________ Continue your current medications for now.  ________________________________________________________  You have been scheduled for a Barium Esophogram at [facility] (1st floor of the hospital) on Thursday 12/29/19 at 10:30 am. Please arrive 15 minutes prior to your appointment for registration. Make certain not to have anything to eat or drink 3 hours prior to your test. If you need to reschedule for any reason, please contact radiology at [phone] to do so. ________________________________________________________ A barium swallow is an examination that concentrates on views of the esophagus. This tends to be a double contrast exam (barium and two liquids which, when combined, create a gas to distend the wall of the oesophagus) or single contrast (non-ionic iodine based). The study is usually tailored to your symptoms so a good history is essential. Attention is paid during the study to the form, structure and configuration of the esophagus, looking for functional disorders (such as aspiration, dysphagia, achalasia, motility and reflux) EXAMINATION You may be asked to change into a gown, depending on the type of swallow being performed. A radiologist and radiographer will perform the procedure. The radiologist will advise you of the type of contrast selected for your procedure and direct you during the exam. You will be asked to stand, sit or lie in several different positions and to hold a small amount of  fluid in your mouth before being asked to swallow while the imaging is performed .In some instances you may be asked to swallow barium coated marshmallows to assess the motility of a solid food bolus. The exam can be recorded as a digital or video fluoroscopy procedure. POST PROCEDURE It will take 1-2 days for the barium to pass through your system. To facilitate this, it is important, unless otherwise directed, to increase your fluids for the next 24-48hrs and to resume your normal diet.  This test typically takes about 30 minutes to perform. ________________________________________________________

## 2019-12-06 NOTE — Progress Notes (Signed)
   Subjective:    Patient ID: [name] James, female    DOB: 02/10/1936, 83 y.o.   MRN: [id number]  HPI [name] James is an 83 year old female with a history of GERD, Barrett's esophagus, gastritis, anemia, IBS with loose stools, end-stage renal disease on dialysis (Monday Wednesday Friday), COPD on oxygen, remote breast cancer who is here for follow-up.  She was last seen on 10/13/2019 by Ellouise Newer, PA-C.  She reports that she has been "getting by".  Dialysis has been rather uneventful.  She is having some intermittent issues with solid food dysphagia.  This seems to come and go.  Is not having heartburn.  Very rarely will use Tums.  Appetite is poor.  No nausea or vomiting.  She still has loose stools and cholestyramine help this tremendously but she reports nephrology recommended that she hold on taking this for now as it was interfering with electrolytes including phosphorus.  She has not seen blood in her stool or melena.  She denies frequent issues with abdominal pain.  She does use promethazine on occasion for what she reports as abdominal cramping.  I question this about nausea and she states it helps with her abdominal cramping.  She had Covid some months ago and reports having sinusitis type symptoms but did not develop respiratory compromise.  Review of Systems As per HPI, otherwise negative  Current Medications, Allergies, Past Medical History, Past Surgical History, Family History and Social History were reviewed in [employer] record.     Objective:   Physical Exam Temp (!) 96.3 F (35.7 C)  Gen: awake, alert, NAD, chronically ill-appearing sitting in a wheelchair HEENT: anicteric, op clear CV: RRR Pulm: CTA b/l Abd: soft, NT/ND, +BS throughout Ext: no c/c/e Neuro: nonfocal      Assessment & Plan:  83 year old female with a history of GERD, Barrett's esophagus, gastritis, anemia, IBS with loose stools, end-stage renal disease on dialysis  (Monday Wednesday Friday), COPD on oxygen, remote breast cancer who is here for follow-up.   1.  Dysphagia symptom/history of GERD/history of Barrett's esophagus --she had an endoscopy 5 years ago with short segment Barrett's esophagus without dysplasia.  Surveillance has been deferred based on her chronic illness.  She would be higher than baseline risk for upper endoscopy and so we will attempt barium esophagram first.  If abnormal we will consider endoscopy in the outpatient hospital setting given her chronic oxygen use. --Barium esophagram with tablet --She is not having heartburn and so has not been on PPI therapy.  She uses Tums very rarely if she has indigestion.  If barium esophagram rather unremarkable, could consider famotidine twice daily in the event reflux is contributing to her intermittent dysphagia  2.  Intermittent diarrhea --controlled with Imodium.  She can continue 4 mg 2-3 times daily as needed.  Cholestyramine was helpful but on hold now per nephrology given issues with electrolyte balance.  3.  Nausea -she reports rare of late but can use promethazine 12.5 to 25 mg every 8 hours as needed  25 minutes spent with the patient today. Greater than 50% was spent in counseling and coordination of care with the patient

## 2019-12-07 ENCOUNTER — Other Ambulatory Visit: Payer: Self-pay | Admitting: Family Medicine

## 2019-12-07 ENCOUNTER — Encounter: Payer: Self-pay | Admitting: Vascular Surgery

## 2019-12-07 ENCOUNTER — Other Ambulatory Visit: Payer: Self-pay

## 2019-12-07 ENCOUNTER — Ambulatory Visit (INDEPENDENT_AMBULATORY_CARE_PROVIDER_SITE_OTHER): Payer: Medicare Other | Admitting: Vascular Surgery

## 2019-12-07 VITALS — BP 162/76 | HR 69 | Temp 97.2°F | Resp 18 | Ht 65.0 in | Wt 134.2 lb

## 2019-12-07 DIAGNOSIS — J189 Pneumonia, unspecified organism: Secondary | ICD-10-CM

## 2019-12-07 DIAGNOSIS — Z992 Dependence on renal dialysis: Secondary | ICD-10-CM | POA: Diagnosis not present

## 2019-12-07 DIAGNOSIS — N186 End stage renal disease: Secondary | ICD-10-CM | POA: Diagnosis not present

## 2019-12-07 DIAGNOSIS — R079 Chest pain, unspecified: Secondary | ICD-10-CM

## 2019-12-07 NOTE — H&P (View-Only) (Signed)
 Patient name: [name] James MRN: [id number] DOB: 04/25/1936 Sex: female  REASON FOR VISIT:   To evaluate a scab overlying her AV graft.  The consult is requested by Samantha Collins, PA.  HPI:   [name] James is a pleasant 83 y.o. female who was last seen in our office by Samantha Rhyne on 10/06/2019.  At that time her hemodialysis access was being evaluated.  She had multiple previous procedures as outlined below.  The patient has a functioning right forearm AV graft.  She dialyzes on Monday Wednesdays and Fridays.  She is a resident of [facility].  She does not know exactly when she developed the small wound over the medial aspect of her right forearm AV graft which is along the venous side of the graft.  She denies any significant drainage associated with this.  She was referred to have this evaluated.  She tells me the wound has been getting better.  She denies any recent uremic symptoms.  Access Procedures: (from most recent to past) 03/29/2019:  venoplasty right brachial vein 6mm x 8cm balloon and 7 x 6 balloon 12/24/2018: venoplasty of 2 stenoses within outflow branch of brachial vein by CSD 08/27/18: ligation of LUA AVG by CSD 06/08/18:  New right FA AVG by CSD 03/19/18: venoplasty of left [place] vein and left Brachial vein with drug coated balloon angioplasty of left [place] vein by CSD 01/19/18: angioplasty left innominate vein & left [place] vein by WB 04/12/15: new LUA AVG and removal of old FA AVG by CSD 03/20/15:  Ligation of left RC AVF and placement of left FA AVG by CSD 02/12/15:  fistulogram left RC AVFwith venoplasty of left cephalic vein by CSD 11/30/14 left RC AVF by CSD   Past Medical History:  Diagnosis Date  . Anemia, chronic disease   . Anxiety   . Arthritis    knees  . Atrial fibrillation (HCC)   . Barrett esophagus   . Benign paroxysmal positional vertigo   . Breast cancer (HCC) 1980s   bil mastectomies, no radiation or chemo  . Cataract   . Chest pain, atypical  12/05/2008   R/Lmv- normal perfusion all regions, noe ECG changes   . CHF (congestive heart failure) (HCC) 05/13/2011   echo - EF>55%; stage 1 diastolic dysfunction; elevated LV filling pressure, MAC  . CKD (chronic kidney disease), stage III   . Claudication (HCC) 10/30/2005   doppler - normal evaluation, no evidence of aneurysm, diameter reduction, dissection, compression or vascular abnormality)  . Complication of anesthesia   . Controlled type 2 diabetes mellitus with diabetic polyneuropathy, with long-term current use of insulin (HCC) 04/17/2016  . Dyslipidemia   . Dysrhythmia    Atrial Fibrillation  . Edema   . ESRD (end stage renal disease) on dialysis (HCC)    "[facility]; [address]; MWF" (07/06/2018)  . Family history of adverse reaction to anesthesia    son also has nausea   . Fever blister 12/2016  . Gastritis and gastroduodenitis   . GERD (gastroesophageal reflux disease)   . Gout   . Hemodialysis-associated hypotension   . HLD (hyperlipidemia)   . Hypertension   . IBS (irritable bowel syndrome)   . Insomnia   . Iron deficiency anemia   . Junctional bradycardia 08/25/2018  . LFT elevation   . Long term current use of amiodarone 10/17/2016  . Morbid (severe) obesity due to excess calories (HCC) 04/29/2017  . Morbid obesity (HCC)   . On home oxygen   therapy    "2L; 24/7" (07/06/2018)  . PAF (paroxysmal atrial fibrillation) (HCC)   . Peripheral neuropathy   . Pneumonia 12/2016  . PONV (postoperative nausea and vomiting)   . Renal cyst   . Renal insufficiency 10/30/2005   doppler - abn resistance consistent w/ parenchymal disease  . Renovascular hypertension   . Type 2 diabetes mellitus with diabetic neuropathy, with long-term current use of insulin (HCC)   . Vertigo     Family History  Problem Relation Age of Onset  . Diabetes Mother   . Heart disease Mother   . Non-Hodgkin's lymphoma Mother   . Heart attack Father   . Stroke Father   . Parkinson's disease Brother     . Lung disease Brother   . Heart disease Brother   . Multiple myeloma Sister     SOCIAL HISTORY: Social History   Tobacco Use  . Smoking status: Never Smoker  . Smokeless tobacco: Never Used  Substance Use Topics  . Alcohol use: No    Allergies  Allergen Reactions  . Erythromycin Swelling  . Codeine Palpitations, Rash and Other (See Comments)    GI Upset  . Erythromycin Base Other (See Comments)    GI Upset  . Penicillin G Rash    Has patient had a PCN reaction causing immediate rash, facial/tongue/throat swelling, SOB or lightheadedness with hypotension: Yes Has patient had a PCN reaction causing severe rash involving mucus membranes or skin necrosis: Unk Has patient had a PCN reaction that required hospitalization: Unk Has patient had a PCN reaction occurring within the last 10 years: Unk If all of the above answers are "NO", then may proceed with Cephalosporin use..  . Penicillins Rash and Other (See Comments)    Has taken Keflex & Rocephin many times without problems Has patient had a PCN reaction causing immediate rash, facial/tongue/throat swelling, SOB or lightheadedness with hypotension: Yes Has patient had a PCN reaction causing severe rash involving mucus membranes or skin necrosis: Unk Has patient had a PCN reaction that required hospitalization: Unk Has patient had a PCN reaction occurring within the last 10 years: Unk If all of the above answers are "NO", then may proceed with Cephalosporin use.   . Zantac [Ranitidine Hcl] Nausea And Vomiting    Current Outpatient Medications  Medication Sig Dispense Refill  . acetaminophen (TYLENOL) 325 MG tablet Take 650 mg by mouth every 6 (six) hours as needed for fever.     . allopurinol (ZYLOPRIM) 100 MG tablet Take 100 mg by mouth daily. (0900)    . aspirin 81 MG chewable tablet Chew 81 mg by mouth daily. (0900)    . B Complex-C-Folic Acid (NEPHRO VITAMINS) 0.8 MG TABS Take 1 tablet by mouth daily. (0900)    .  Brinzolamide-Brimonidine (SIMBRINZA) 1-0.2 % SUSP Place 1 drop into both eyes 2 (two) times daily. (0900 & 2100)    . calcium acetate (PHOSLO) 667 MG capsule Take 1,334 mg by mouth 3 (three) times daily with meals.    . cholestyramine (QUESTRAN) 4 g packet Take 1 packet (4 g total) by mouth 2 (two) times daily. Take twice daily before breakfast and dinner 60 each 0  . diazepam (VALIUM) 5 MG tablet Take 1 tablet (5 mg total) by mouth at bedtime. (Patient taking differently: Take 5 mg by mouth at bedtime. (2100)) 5 tablet 0  . diclofenac sodium (VOLTAREN) 1 % GEL Apply 4 g topically 4 (four) times daily. Applied to right knee    .   DIMETHICONE, TOPICAL, 5 % CREA Apply 1 application topically See admin instructions. Place a nickel-sized amount onto buttock(s) area 2 times a day for barrier protection    . Lidocaine (ASPERCREME LIDOCAINE) 4 % PTCH Place 1 patch onto the skin daily. Applied to right knee (on at 0800 & off at 2000)    . loperamide (IMODIUM A-D) 2 MG tablet Take 2 tablets (4 mg total) by mouth every 8 (eight) hours as needed for diarrhea or loose stools (for diarrhea). 50 tablet 2  . loratadine (CLARITIN) 10 MG tablet Take 10 mg by mouth daily. (0900)    . meclizine (ANTIVERT) 25 MG tablet Take 25 mg by mouth every 8 (eight) hours as needed for dizziness (vertigo).     . Melatonin 3 MG TABS Take 6 mg by mouth at bedtime.     . midodrine (PROAMATINE) 10 MG tablet Take 10 mg by mouth See admin instructions. Take 10 mg by mouth WITH MORNING MEDS AT FACILITY on Mon/Wed/Fri- dialysis days Take 10 mg by mouth at dialysis on Mon/Wed/Fri    . midodrine (PROAMATINE) 2.5 MG tablet Take 2.5 mg by mouth See admin instructions. 3 times daily on Sun Tues Thurs and Sat , and 2 times daily on Mon Wed and Fri after Dialysis    . OXYGEN Inhale 2 L into the lungs continuous.     . promethazine (PHENERGAN) 25 MG tablet Take 1 tablet (25 mg total) by mouth every 8 (eight) hours as needed for nausea or vomiting.  50 tablet 1  . traZODone (DESYREL) 50 MG tablet Take 25 mg by mouth at bedtime. (2100)    . metoprolol tartrate (LOPRESSOR) 50 MG tablet Take 1 tablet (50 mg total) by mouth 2 (two) times daily. Per ER dosing 180 tablet 3   No current facility-administered medications for this visit.    REVIEW OF SYSTEMS:  [X] denotes positive finding, [ ] denotes negative finding Cardiac  Comments:  Chest pain or chest pressure:    Shortness of breath upon exertion: x   Short of breath when lying flat:    Irregular heart rhythm:        Vascular    Pain in calf, thigh, or hip brought on by ambulation:    Pain in feet at night that wakes you up from your sleep:     Blood clot in your veins:    Leg swelling:         Pulmonary    Oxygen at home:    Productive cough:     Wheezing:         Neurologic    Sudden weakness in arms or legs:     Sudden numbness in arms or legs:     Sudden onset of difficulty speaking or slurred speech:    Temporary loss of vision in one eye:     Problems with dizziness:         Gastrointestinal    Blood in stool:     Vomited blood:         Genitourinary    Burning when urinating:     Blood in urine:        Psychiatric    Major depression:         Hematologic    Bleeding problems:    Problems with blood clotting too easily:        Skin    Rashes or ulcers:        Constitutional      Fever or chills:     PHYSICAL EXAM:   Vitals:   12/07/19 0905  BP: (!) 162/76  Pulse: 69  Resp: 18  Temp: (!) 97.2 F (36.2 C)  SpO2: 95%  Weight: 134 lb 3.2 oz (60.9 kg)  Height: 5' 5" (1.651 m)    GENERAL: The patient is a well-nourished female, in no acute distress. The vital signs are documented above. CARDIAC: There is a regular rate and rhythm.  VASCULAR: She has a thrill and bruit in her right forearm AV graft.  The arterial half of the graft is along the lateral aspect of the forearm.  Along the venous aspect of the graft there is a very small wound as  documented below.   PULMONARY: There is good air exchange bilaterally without wheezing or rales. MUSCULOSKELETAL: There are no major deformities or cyanosis. NEUROLOGIC: No focal weakness or paresthesias are detected. SKIN: The wound on her right forearm as documented below. PSYCHIATRIC: The patient has a normal affect.  DATA:    No new data.  MEDICAL ISSUES:   WOUND OVERLYING RIGHT FOREARM AV GRAFT: Given the small wound overlying her AV graft I think the safest approach would be to revise the venous half of the graft which is along the medial aspect of the forearm.  I think we could bypass medial to this area otherwise the graft ultimately needed would be very hard to cannulate.  I think there is room along the arterial half of the graft to get 2 needles in the meantime.  I have also explained that certainly she has lots of issues with this graft and ultimately she may require new access or a catheter.  She dialyzes on Monday Wednesdays and Fridays and her surgery has been scheduled for Tuesday, 12/13/2019.  A total of 25 minutes was spent on this visit. 15 minutes was face to face time. More than 50% of the time was spent on counseling and coordinating with the patient.   Jillyan Plitt [facility] [phone] 

## 2019-12-07 NOTE — Progress Notes (Signed)
Patient name: [name] James MRN: [id number] DOB: [birth year] Sex: female  REASON FOR VISIT:   To evaluate a scab overlying her AV graft.  The consult is requested by Anice Paganini, PA.  HPI:   [name] James is a pleasant 83 y.o. female who was last seen in our office by Leontine Locket on 10/06/2019.  At that time her hemodialysis access was being evaluated.  She had multiple previous procedures as outlined below.  The patient has a functioning right forearm AV graft.  She dialyzes on Monday Wednesdays and Fridays.  She is a resident of [facility].  She does not know exactly when she developed the small wound over the medial aspect of her right forearm AV graft which is along the venous side of the graft.  She denies any significant drainage associated with this.  She was referred to have this evaluated.  She tells me the wound has been getting better.  She denies any recent uremic symptoms.  Access Procedures: (from most recent to past) 03/29/2019:  venoplasty right brachial vein 57m x 8cm balloon and 7 x 6 balloon 12/24/2018: venoplasty of 2 stenoses within outflow branch of brachial vein by CSD 08/27/18: ligation of LUA AVG by CSD 06/08/18:  New right FA AVG by CSD 03/19/18: venoplasty of left [place] vein and left Brachial vein with drug coated balloon angioplasty of left [place] vein by CSD 01/19/18: angioplasty left innominate vein & left [place] vein by WB 04/12/15: new LUA AVG and removal of old FA AVG by CSD 03/20/15:  Ligation of left RC AVF and placement of left FA AVG by CSD 02/12/15:  fistulogram left RC AVFwith venoplasty of left cephalic vein by CSD 163/84/66left RC AVF by CSD   Past Medical History:  Diagnosis Date  . Anemia, chronic disease   . Anxiety   . Arthritis    knees  . Atrial fibrillation ([facility]   . Barrett esophagus   . Benign paroxysmal positional vertigo   . Breast cancer ([facility] 1980s   bil mastectomies, no radiation or chemo  . Cataract   . Chest pain, atypical  12/05/2008   R/Lmv- normal perfusion all regions, noe ECG changes   . CHF (congestive heart failure) ([facility] 05/13/2011   echo - EZL>93% stage 1 diastolic dysfunction; elevated LV filling pressure, MAC  . CKD (chronic kidney disease), stage III   . Claudication ([facility] 10/30/2005   doppler - normal evaluation, no evidence of aneurysm, diameter reduction, dissection, compression or vascular abnormality)  . Complication of anesthesia   . Controlled type 2 diabetes mellitus with diabetic polyneuropathy, with long-term current use of insulin ([facility] 04/17/2016  . Dyslipidemia   . Dysrhythmia    Atrial Fibrillation  . Edema   . ESRD (end stage renal disease) on dialysis ([facility] [place]    "[facility]; [address]; MWF" (07/06/2018)  . Family history of adverse reaction to anesthesia    son also has nausea   . Fever blister 12/2016  . Gastritis and gastroduodenitis   . GERD (gastroesophageal reflux disease)   . Gout   . Hemodialysis-associated hypotension   . HLD (hyperlipidemia)   . Hypertension   . IBS (irritable bowel syndrome)   . Insomnia   . Iron deficiency anemia   . Junctional bradycardia 08/25/2018  . LFT elevation   . Long term current use of amiodarone 10/17/2016  . Morbid (severe) obesity due to excess calories ([facility] 04/29/2017  . Morbid obesity ([facility] [place]   . On home oxygen  therapy    "2L; 24/7" (07/06/2018)  . PAF (paroxysmal atrial fibrillation) ([place])   . Peripheral neuropathy   . Pneumonia 12/2016  . PONV (postoperative nausea and vomiting)   . Renal cyst   . Renal insufficiency 10/30/2005   doppler - abn resistance consistent w/ parenchymal disease  . Renovascular hypertension   . Type 2 diabetes mellitus with diabetic neuropathy, with long-term current use of insulin ([place])   . Vertigo     Family History  Problem Relation Age of Onset  . Diabetes Mother   . Heart disease Mother   . Non-Hodgkin's lymphoma Mother   . Heart attack Father   . Stroke Father   . Parkinson's disease Brother     . Lung disease Brother   . Heart disease Brother   . Multiple myeloma Sister     SOCIAL HISTORY: Social History   Tobacco Use  . Smoking status: Never Smoker  . Smokeless tobacco: Never Used  Substance Use Topics  . Alcohol use: No    Allergies  Allergen Reactions  . Erythromycin Swelling  . Codeine Palpitations, Rash and Other (See Comments)    GI Upset  . Erythromycin Base Other (See Comments)    GI Upset  . Penicillin G Rash    Has patient had a PCN reaction causing immediate rash, facial/tongue/throat swelling, SOB or lightheadedness with hypotension: Yes Has patient had a PCN reaction causing severe rash involving mucus membranes or skin necrosis: Unk Has patient had a PCN reaction that required hospitalization: Unk Has patient had a PCN reaction occurring within the last 10 years: Unk If all of the above answers are "NO", then may proceed with Cephalosporin use..  . Penicillins Rash and Other (See Comments)    Has taken Keflex & Rocephin many times without problems Has patient had a PCN reaction causing immediate rash, facial/tongue/throat swelling, SOB or lightheadedness with hypotension: Yes Has patient had a PCN reaction causing severe rash involving mucus membranes or skin necrosis: Unk Has patient had a PCN reaction that required hospitalization: Unk Has patient had a PCN reaction occurring within the last 10 years: Unk If all of the above answers are "NO", then may proceed with Cephalosporin use.   Keturah Shavers [Ranitidine Hcl] Nausea And Vomiting    Current Outpatient Medications  Medication Sig Dispense Refill  . acetaminophen (TYLENOL) 325 MG tablet Take 650 mg by mouth every 6 (six) hours as needed for fever.     Marland Kitchen allopurinol (ZYLOPRIM) 100 MG tablet Take 100 mg by mouth daily. (0900)    . aspirin 81 MG chewable tablet Chew 81 mg by mouth daily. (0900)    . B Complex-C-Folic Acid (NEPHRO VITAMINS) 0.8 MG TABS Take 1 tablet by mouth daily. (0900)    .  Brinzolamide-Brimonidine (SIMBRINZA) 1-0.2 % SUSP Place 1 drop into both eyes 2 (two) times daily. (0900 & 2100)    . calcium acetate (PHOSLO) 667 MG capsule Take 1,334 mg by mouth 3 (three) times daily with meals.    . cholestyramine (QUESTRAN) 4 g packet Take 1 packet (4 g total) by mouth 2 (two) times daily. Take twice daily before breakfast and dinner 60 each 0  . diazepam (VALIUM) 5 MG tablet Take 1 tablet (5 mg total) by mouth at bedtime. (Patient taking differently: Take 5 mg by mouth at bedtime. (2100)) 5 tablet 0  . diclofenac sodium (VOLTAREN) 1 % GEL Apply 4 g topically 4 (four) times daily. Applied to right knee    .  DIMETHICONE, TOPICAL, 5 % CREA Apply 1 application topically See admin instructions. Place a nickel-sized amount onto buttock(s) area 2 times a day for barrier protection    . Lidocaine (ASPERCREME LIDOCAINE) 4 % PTCH Place 1 patch onto the skin daily. Applied to right knee (on at 0800 & off at 2000)    . loperamide (IMODIUM A-D) 2 MG tablet Take 2 tablets (4 mg total) by mouth every 8 (eight) hours as needed for diarrhea or loose stools (for diarrhea). 50 tablet 2  . loratadine (CLARITIN) 10 MG tablet Take 10 mg by mouth daily. (0900)    . meclizine (ANTIVERT) 25 MG tablet Take 25 mg by mouth every 8 (eight) hours as needed for dizziness (vertigo).     . Melatonin 3 MG TABS Take 6 mg by mouth at bedtime.     . midodrine (PROAMATINE) 10 MG tablet Take 10 mg by mouth See admin instructions. Take 10 mg by mouth WITH MORNING MEDS AT FACILITY on Mon/Wed/Fri- dialysis days Take 10 mg by mouth at dialysis on Mon/Wed/Fri    . midodrine (PROAMATINE) 2.5 MG tablet Take 2.5 mg by mouth See admin instructions. 3 times daily on Sun Tues Thurs and Sat , and 2 times daily on Mon Wed and Fri after Dialysis    . OXYGEN Inhale 2 L into the lungs continuous.     . promethazine (PHENERGAN) 25 MG tablet Take 1 tablet (25 mg total) by mouth every 8 (eight) hours as needed for nausea or vomiting.  50 tablet 1  . traZODone (DESYREL) 50 MG tablet Take 25 mg by mouth at bedtime. (2100)    . metoprolol tartrate (LOPRESSOR) 50 MG tablet Take 1 tablet (50 mg total) by mouth 2 (two) times daily. Per ER dosing 180 tablet 3   No current facility-administered medications for this visit.    REVIEW OF SYSTEMS:  [X] denotes positive finding, [ ] denotes negative finding Cardiac  Comments:  Chest pain or chest pressure:    Shortness of breath upon exertion: x   Short of breath when lying flat:    Irregular heart rhythm:        Vascular    Pain in calf, thigh, or hip brought on by ambulation:    Pain in feet at night that wakes you up from your sleep:     Blood clot in your veins:    Leg swelling:         Pulmonary    Oxygen at home:    Productive cough:     Wheezing:         Neurologic    Sudden weakness in arms or legs:     Sudden numbness in arms or legs:     Sudden onset of difficulty speaking or slurred speech:    Temporary loss of vision in one eye:     Problems with dizziness:         Gastrointestinal    Blood in stool:     Vomited blood:         Genitourinary    Burning when urinating:     Blood in urine:        Psychiatric    Major depression:         Hematologic    Bleeding problems:    Problems with blood clotting too easily:        Skin    Rashes or ulcers:        Constitutional  Fever or chills:     PHYSICAL EXAM:   Vitals:   12/07/19 0905  BP: (!) 162/76  Pulse: 69  Resp: 18  Temp: (!) 97.2 F (36.2 C)  SpO2: 95%  Weight: 134 lb 3.2 oz (60.9 kg)  Height: 5' 5" (1.651 m)    GENERAL: The patient is a well-nourished female, in no acute distress. The vital signs are documented above. CARDIAC: There is a regular rate and rhythm.  VASCULAR: She has a thrill and bruit in her right forearm AV graft.  The arterial half of the graft is along the lateral aspect of the forearm.  Along the venous aspect of the graft there is a very small wound as  documented below.   PULMONARY: There is good air exchange bilaterally without wheezing or rales. MUSCULOSKELETAL: There are no major deformities or cyanosis. NEUROLOGIC: No focal weakness or paresthesias are detected. SKIN: The wound on her right forearm as documented below. PSYCHIATRIC: The patient has a normal affect.  DATA:    No new data.  MEDICAL ISSUES:   WOUND OVERLYING RIGHT FOREARM AV GRAFT: Given the small wound overlying her AV graft I think the safest approach would be to revise the venous half of the graft which is along the medial aspect of the forearm.  I think we could bypass medial to this area otherwise the graft ultimately needed would be very hard to cannulate.  I think there is room along the arterial half of the graft to get 2 needles in the meantime.  I have also explained that certainly she has lots of issues with this graft and ultimately she may require new access or a catheter.  She dialyzes on Monday Wednesdays and Fridays and her surgery has been scheduled for Tuesday, 12/13/2019.  A total of 25 minutes was spent on this visit. 15 minutes was face to face time. More than 50% of the time was spent on counseling and coordinating with the patient.   [name] [facility] [phone]

## 2019-12-09 NOTE — Pre-Procedure Instructions (Signed)
   Francene Mcerlean Stigger  12/09/2019     Mrs. Foiles's procedure is scheduled on Tuesday, 12/13/19 at 7:30 AM.   Report to [facility] Entrance "A" Admitting Office at 6:30 AM.   Call this number if you have problems the morning of surgery: [phone]   Remember:  Patient is not to eat or drink after midnight Monday, 12/12/19.  Take these medicines the morning of surgery with A SIP OF WATER: Aspirin, Cetirizine (Zyrtec), Metoprolol (Lopressor), Midodrine (Proamatine), Flonase, may use eye drops and saline nasal spray, Acetaminophen - PRN, Imodium - PRN  Hold Melatonin prior to surgery.  Please check patient's blood sugar when she awakens Tuesday AM.  If blood sugar is 70 or below, treat with 1/2 cup of clear juice (apple or cranberry) and recheck blood sugar 15 minutes after drinking juice. If blood sugar continues to be 70 or below, call the Short Stay department and ask to speak to a nurse.  Per instructions Dr. Albertha Ghee, Anesthesiologist/Makell Drohan Stan Head, RN    Do not wear jewelry, make-up or nail polish.  Do not wear lotions, powders, perfumes or deodorant.  Do not shave 48 hours prior to surgery.   Do not bring valuables to the hospital.  [facility] is not responsible for any belongings or valuables.  Contacts, dentures or bridgework may not be worn into surgery.  Leave your suitcase in the car.  After surgery it may be brought to your room.  For patients admitted to the hospital, discharge time will be determined by your treatment team.  Patients discharged the day of surgery will not be allowed to drive home.   Any questions today, please call Lilia Pro, RN at [phone].

## 2019-12-09 NOTE — Progress Notes (Signed)
Same Day Work-Up Phone Call Incomplete:  Attempt x2 made to call patient. Left voicemail. PCP - [place] Cardiologist - Sanda Klein, MD Nephrologist- Corliss Parish, MD  PPM/ICD -  Chest x-ray - 05/16/2019 EKG - 05/17/2019 Stress Test - 2015 ECHO - 07/06/2018 Cardiac Cath -   Sleep Study -   Fasting Blood Sugar -  Checks Blood Sugar _____ times a day  Aspirin Instructions: Continue until DOS   COVID TEST- 12/10/2019  Anesthesia review: Yes, cardiac Hx.    Patient instructions:   Procedure is scheduled on Tuesday 12/13/2019, from 0730- 0904 AM.  Report to [facility] Main Entrance "A" at 0530 A.M., and check in at the Admitting office.  Call this number if you have problems the morning of surgery:  [phone]    Remember:  Do not eat or drink after midnight the night before your surgery.     Take these medicines the morning of surgery with A SIP OF WATER : allopurinol (ZYLOPRIM), aspirin, cetirizine (ZYRTEC), metoprolol tartrate (LOPRESSOR), cholestyramine (QUESTRAN), midodrine (PROAMATINE), Brinzolamide-Brimonidine (SIMBRINZA) eye drops, SALINE NASAL MIST  IF NEEDED:  acetaminophen (TYLENOL), loperamide (IMODIUM A-D), fluticasone (FLONASE) nasal spray, OXYGEN 2L  As of today, stop taking any diclofenac sodium (VOLTAREN) GEL Aleve, Naproxen, Ibuprofen, Motrin, Advil, Goody's, BC's, all herbal medications, fish oil, and all vitamins.   . Do not take oral diabetes medicines (pills) the morning of surgery.  . The day of surgery, do not take other diabetes injectables.  . Check your blood sugar the morning of your surgery when you wake up and every 2 hours until you get to the Short Stay unit. o If your blood sugar is less than 70 mg/dL, you will need to treat for low blood sugar: - Do not take insulin. - Treat a low blood sugar (less than 70 mg/dL) with  cup of clear juice (cranberry or apple), 4 glucose tablets, OR glucose gel. - Recheck blood  sugar in 15 minutes after treatment (to make sure it is greater than 70 mg/dL). If your blood sugar is not greater than 70 mg/dL on recheck, call [phone] for further instructions.   The Morning of Surgery  Do not wear jewelry, make-up or nail polish.  Do not wear lotions, powders, perfumes, or deodorant  Do not bring valuables to the hospital.  [facility] is not responsible for any belongings or valuables.  If you are a smoker, DO NOT Smoke 24 hours prior to surgery  If you wear a CPAP at night please bring your mask, tubing, and machine the morning of surgery   Remember that you must have someone to transport you home after your surgery, and remain with you for 24 hours if you are discharged the same day.   Please bring cases for contacts, glasses, hearing aids, dentures or bridgework because it cannot be worn into surgery.    Leave your suitcase in the car.  After surgery it may be brought to your room.   Oral Hygiene is also important to reduce your risk of infection.  Remember - BRUSH YOUR TEETH THE MORNING OF SURGERY WITH YOUR REGULAR TOOTHPASTE   1. Shower the NIGHT BEFORE SURGERY and the MORNING OF SURGERY with antibacterial (e.g Dial) Soap.    2. Wash thoroughly, paying special attention to the area where your surgery will be performed.  3. Thoroughly rinse your body with warm water from the neck down.   4. Pat yourself dry with a CLEAN TOWEL.  5. Wear CLEAN  PAJAMAS to bed the night before surgery, wear comfortable clothes the morning of surgery  6. Place CLEAN SHEETS on your bed the night of your first shower and DO NOT SLEEP WITH PETS.

## 2019-12-10 ENCOUNTER — Other Ambulatory Visit (HOSPITAL_COMMUNITY): Payer: Medicare Other

## 2019-12-12 ENCOUNTER — Encounter (HOSPITAL_COMMUNITY): Payer: Self-pay | Admitting: Vascular Surgery

## 2019-12-12 ENCOUNTER — Other Ambulatory Visit: Payer: Self-pay

## 2019-12-12 ENCOUNTER — Other Ambulatory Visit (HOSPITAL_COMMUNITY)
Admission: RE | Admit: 2019-12-12 | Discharge: 2019-12-12 | Disposition: A | Discharge status: Home / Self Care | Payer: Medicare Other | Source: Ambulatory Visit | Attending: Vascular Surgery | Admitting: Vascular Surgery

## 2019-12-12 DIAGNOSIS — Z20828 Contact with and (suspected) exposure to other viral communicable diseases: Secondary | ICD-10-CM | POA: Diagnosis not present

## 2019-12-12 DIAGNOSIS — Z01812 Encounter for preprocedural laboratory examination: Secondary | ICD-10-CM | POA: Diagnosis present

## 2019-12-12 LAB — SARS CORONAVIRUS 2 (TAT 6-24 HRS): SARS Coronavirus 2: NEGATIVE

## 2019-12-12 NOTE — Progress Notes (Signed)
Pt is a resident at [facility]. Spoke with Lonn Georgia, RN for pre-op call. She states pt is alert, oriented and able to speak for herself.  Pt went for her Covid test today and then will go to dialysis. Lonn Georgia states pt always goes straight to her room and doesn't go out after dialysis. She will be able to quarantine without any problems. Pt does have a roommate, but she is not sick per [facility]. Pt has hx of A-fib, Dr. Sallyanne Kuster is her cardiologist. Dr. [name] is her nephrologist. Lonn Georgia states pt has not had any recent chest pain or sob.  Pt is a type 2 diabetic. Fasting blood sugars are between 80-110. Last A1C was <4.3 on 10/12/19 per [facility]. Pre-op instructions faxed to Lonn Georgia, RN at [phone].

## 2019-12-13 ENCOUNTER — Other Ambulatory Visit: Payer: Self-pay

## 2019-12-13 ENCOUNTER — Encounter (HOSPITAL_COMMUNITY)
Admission: RE | Disposition: A | Discharge status: Home / Self Care | Payer: Self-pay | Source: Home / Self Care | Attending: Vascular Surgery

## 2019-12-13 ENCOUNTER — Ambulatory Visit (HOSPITAL_COMMUNITY): Payer: Medicare Other | Admitting: Certified Registered"

## 2019-12-13 ENCOUNTER — Ambulatory Visit (HOSPITAL_COMMUNITY)
Admission: RE | Admit: 2019-12-13 | Discharge: 2019-12-13 | Disposition: A | Discharge status: Home / Self Care | Payer: Medicare Other | Attending: Vascular Surgery | Admitting: Vascular Surgery

## 2019-12-13 ENCOUNTER — Encounter (HOSPITAL_COMMUNITY): Payer: Self-pay | Admitting: Vascular Surgery

## 2019-12-13 DIAGNOSIS — Z885 Allergy status to narcotic agent status: Secondary | ICD-10-CM | POA: Diagnosis not present

## 2019-12-13 DIAGNOSIS — Z853 Personal history of malignant neoplasm of breast: Secondary | ICD-10-CM | POA: Insufficient documentation

## 2019-12-13 DIAGNOSIS — Z881 Allergy status to other antibiotic agents status: Secondary | ICD-10-CM | POA: Diagnosis not present

## 2019-12-13 DIAGNOSIS — E1151 Type 2 diabetes mellitus with diabetic peripheral angiopathy without gangrene: Secondary | ICD-10-CM | POA: Insufficient documentation

## 2019-12-13 DIAGNOSIS — Z888 Allergy status to other drugs, medicaments and biological substances status: Secondary | ICD-10-CM | POA: Diagnosis not present

## 2019-12-13 DIAGNOSIS — I5032 Chronic diastolic (congestive) heart failure: Secondary | ICD-10-CM | POA: Insufficient documentation

## 2019-12-13 DIAGNOSIS — Z79899 Other long term (current) drug therapy: Secondary | ICD-10-CM | POA: Insufficient documentation

## 2019-12-13 DIAGNOSIS — E785 Hyperlipidemia, unspecified: Secondary | ICD-10-CM | POA: Diagnosis not present

## 2019-12-13 DIAGNOSIS — Z880 Allergy status to penicillin: Secondary | ICD-10-CM | POA: Diagnosis not present

## 2019-12-13 DIAGNOSIS — D631 Anemia in chronic kidney disease: Secondary | ICD-10-CM | POA: Diagnosis not present

## 2019-12-13 DIAGNOSIS — Z992 Dependence on renal dialysis: Secondary | ICD-10-CM

## 2019-12-13 DIAGNOSIS — I132 Hypertensive heart and chronic kidney disease with heart failure and with stage 5 chronic kidney disease, or end stage renal disease: Secondary | ICD-10-CM | POA: Diagnosis not present

## 2019-12-13 DIAGNOSIS — T82898A Other specified complication of vascular prosthetic devices, implants and grafts, initial encounter: Secondary | ICD-10-CM

## 2019-12-13 DIAGNOSIS — Z7982 Long term (current) use of aspirin: Secondary | ICD-10-CM | POA: Diagnosis not present

## 2019-12-13 DIAGNOSIS — Y832 Surgical operation with anastomosis, bypass or graft as the cause of abnormal reaction of the patient, or of later complication, without mention of misadventure at the time of the procedure: Secondary | ICD-10-CM | POA: Diagnosis not present

## 2019-12-13 DIAGNOSIS — E1142 Type 2 diabetes mellitus with diabetic polyneuropathy: Secondary | ICD-10-CM | POA: Diagnosis not present

## 2019-12-13 DIAGNOSIS — N186 End stage renal disease: Secondary | ICD-10-CM

## 2019-12-13 DIAGNOSIS — Z9981 Dependence on supplemental oxygen: Secondary | ICD-10-CM | POA: Insufficient documentation

## 2019-12-13 DIAGNOSIS — M109 Gout, unspecified: Secondary | ICD-10-CM | POA: Insufficient documentation

## 2019-12-13 DIAGNOSIS — I480 Paroxysmal atrial fibrillation: Secondary | ICD-10-CM | POA: Diagnosis not present

## 2019-12-13 DIAGNOSIS — E1122 Type 2 diabetes mellitus with diabetic chronic kidney disease: Secondary | ICD-10-CM | POA: Insufficient documentation

## 2019-12-13 HISTORY — PX: REVISION OF ARTERIOVENOUS GORETEX GRAFT: SHX6073

## 2019-12-13 LAB — POCT I-STAT, CHEM 8
BUN: 23 mg/dL (ref 8–23)
Calcium, Ion: 1.02 mmol/L — ABNORMAL LOW (ref 1.15–1.40)
Chloride: 100 mmol/L (ref 98–111)
Creatinine, Ser: 3.8 mg/dL — ABNORMAL HIGH (ref 0.44–1.00)
Glucose, Bld: 110 mg/dL — ABNORMAL HIGH (ref 70–99)
HCT: 35 % — ABNORMAL LOW (ref 36.0–46.0)
Hemoglobin: 11.9 g/dL — ABNORMAL LOW (ref 12.0–15.0)
Potassium: 3 mmol/L — ABNORMAL LOW (ref 3.5–5.1)
Sodium: 141 mmol/L (ref 135–145)
TCO2: 30 mmol/L (ref 22–32)

## 2019-12-13 LAB — GLUCOSE, CAPILLARY
Glucose-Capillary: 97 mg/dL (ref 70–99)
Glucose-Capillary: 73 mg/dL (ref 70–99)
Glucose-Capillary: 71 mg/dL (ref 70–99)
Glucose-Capillary: 81 mg/dL (ref 70–99)

## 2019-12-13 LAB — SURGICAL PCR SCREEN
MRSA, PCR: POSITIVE — AB
Staphylococcus aureus: POSITIVE — AB

## 2019-12-13 SURGERY — REVISION OF ARTERIOVENOUS GORETEX GRAFT
Anesthesia: Monitor Anesthesia Care | Laterality: Right

## 2019-12-13 MED ORDER — ONDANSETRON HCL 4 MG/2ML IJ SOLN: 4.0000 mg | Freq: Once | INTRAMUSCULAR | Status: DC | PRN

## 2019-12-13 MED ORDER — CHLORHEXIDINE GLUCONATE 4 % EX LIQD: 60.0000 mL | Freq: Once | CUTANEOUS | Status: DC

## 2019-12-13 MED ORDER — HEPARIN SODIUM (PORCINE) 1000 UNIT/ML IJ SOLN
INTRAMUSCULAR | Status: DC | PRN
  Administered 2019-12-13: 6000 [IU] via INTRAVENOUS

## 2019-12-13 MED ORDER — MUPIROCIN 2 % EX OINT
1.0000 "application " | TOPICAL_OINTMENT | Freq: Once | CUTANEOUS | Status: AC
  Administered 2019-12-13: 1 via TOPICAL
  Filled 2019-12-13: qty 22

## 2019-12-13 MED ORDER — PROPOFOL 500 MG/50ML IV EMUL
INTRAVENOUS | Status: DC | PRN
  Administered 2019-12-13: 50 ug/kg/min via INTRAVENOUS

## 2019-12-13 MED ORDER — THROMBIN (RECOMBINANT) 20000 UNITS EX SOLR
CUTANEOUS | Status: AC
  Filled 2019-12-13: qty 20000

## 2019-12-13 MED ORDER — SODIUM CHLORIDE 0.9 % IV SOLN
INTRAVENOUS | Status: AC
  Filled 2019-12-13: qty 1.2

## 2019-12-13 MED ORDER — PHENYLEPHRINE HCL (PRESSORS) 10 MG/ML IV SOLN
INTRAVENOUS | Status: DC | PRN
  Administered 2019-12-13 (×7): 80 ug via INTRAVENOUS

## 2019-12-13 MED ORDER — OXYCODONE HCL 5 MG PO TABS: 5.0000 mg | ORAL_TABLET | Freq: Once | ORAL | Status: DC | PRN

## 2019-12-13 MED ORDER — FENTANYL CITRATE (PF) 100 MCG/2ML IJ SOLN: 25.0000 ug | INTRAMUSCULAR | Status: DC | PRN

## 2019-12-13 MED ORDER — LIDOCAINE-EPINEPHRINE 1 %-1:100000 IJ SOLN
INTRAMUSCULAR | Status: AC
  Filled 2019-12-13: qty 1

## 2019-12-13 MED ORDER — HEMOSTATIC AGENTS (NO CHARGE) OPTIME
TOPICAL | Status: DC | PRN
  Administered 2019-12-13: 1 via TOPICAL

## 2019-12-13 MED ORDER — SODIUM CHLORIDE 0.9 % IV SOLN
INTRAVENOUS | Status: DC | PRN
  Administered 2019-12-13: 500 mL

## 2019-12-13 MED ORDER — HEPARIN SODIUM (PORCINE) 1000 UNIT/ML IJ SOLN
INTRAMUSCULAR | Status: AC
  Filled 2019-12-13: qty 1

## 2019-12-13 MED ORDER — PROPOFOL 10 MG/ML IV BOLUS
INTRAVENOUS | Status: AC
  Filled 2019-12-13: qty 20

## 2019-12-13 MED ORDER — PROTAMINE SULFATE 10 MG/ML IV SOLN
INTRAVENOUS | Status: AC
  Filled 2019-12-13: qty 5

## 2019-12-13 MED ORDER — OXYCODONE HCL 5 MG/5ML PO SOLN: 5.0000 mg | Freq: Once | ORAL | Status: DC | PRN

## 2019-12-13 MED ORDER — FENTANYL CITRATE (PF) 250 MCG/5ML IJ SOLN
INTRAMUSCULAR | Status: AC
  Filled 2019-12-13: qty 5

## 2019-12-13 MED ORDER — LIDOCAINE-EPINEPHRINE 1 %-1:100000 IJ SOLN
INTRAMUSCULAR | Status: DC | PRN
  Administered 2019-12-13: 15 mL via INTRADERMAL

## 2019-12-13 MED ORDER — PHENYLEPHRINE HCL-NACL 10-0.9 MG/250ML-% IV SOLN
INTRAVENOUS | Status: DC | PRN
  Administered 2019-12-13: 50 ug/min via INTRAVENOUS

## 2019-12-13 MED ORDER — PROTAMINE SULFATE 10 MG/ML IV SOLN
INTRAVENOUS | Status: DC | PRN
  Administered 2019-12-13: 10 mg via INTRAVENOUS
  Administered 2019-12-13: 30 mg via INTRAVENOUS

## 2019-12-13 MED ORDER — TRAMADOL HCL 50 MG PO TABS: 25.0000 mg | ORAL_TABLET | Freq: Three times a day (TID) | ORAL | 0 refills | Status: DC | PRN

## 2019-12-13 MED ORDER — VANCOMYCIN HCL IN DEXTROSE 1-5 GM/200ML-% IV SOLN
1000.0000 mg | INTRAVENOUS | Status: AC
  Administered 2019-12-13: 1000 mg via INTRAVENOUS
  Filled 2019-12-13: qty 200

## 2019-12-13 MED ORDER — LIDOCAINE HCL (PF) 1 % IJ SOLN
INTRAMUSCULAR | Status: AC
  Filled 2019-12-13: qty 30

## 2019-12-13 MED ORDER — THROMBIN 20000 UNITS EX SOLR
CUTANEOUS | Status: DC | PRN
  Administered 2019-12-13: 20 mL via TOPICAL

## 2019-12-13 MED ORDER — SODIUM CHLORIDE 0.9 % IV SOLN: INTRAVENOUS | Status: DC

## 2019-12-13 MED ORDER — 0.9 % SODIUM CHLORIDE (POUR BTL) OPTIME
TOPICAL | Status: DC | PRN
  Administered 2019-12-13: 1000 mL

## 2019-12-13 MED ORDER — VANCOMYCIN HCL 1000 MG IV SOLR
INTRAVENOUS | Status: DC | PRN
  Administered 2019-12-13: 1000 mg via INTRAVENOUS

## 2019-12-13 MED ORDER — LIDOCAINE 2% (20 MG/ML) 5 ML SYRINGE
INTRAMUSCULAR | Status: AC
  Filled 2019-12-13: qty 5

## 2019-12-13 MED ORDER — FENTANYL CITRATE (PF) 100 MCG/2ML IJ SOLN
INTRAMUSCULAR | Status: DC | PRN
  Administered 2019-12-13: 50 ug via INTRAVENOUS

## 2019-12-13 MED ORDER — PHENYLEPHRINE 40 MCG/ML (10ML) SYRINGE FOR IV PUSH (FOR BLOOD PRESSURE SUPPORT)
PREFILLED_SYRINGE | INTRAVENOUS | Status: AC
  Filled 2019-12-13: qty 10

## 2019-12-13 MED ORDER — EPHEDRINE SULFATE 50 MG/ML IJ SOLN
INTRAMUSCULAR | Status: DC | PRN
  Administered 2019-12-13 (×4): 5 mg via INTRAVENOUS

## 2019-12-13 SURGICAL SUPPLY — 39 items
ADH SKN CLS APL DERMABOND .7 (GAUZE/BANDAGES/DRESSINGS) ×1
CANISTER SUCT 3000ML PPV (MISCELLANEOUS) ×3 IMPLANT
CANNULA VESSEL 3MM 2 BLNT TIP (CANNULA) ×3 IMPLANT
CLIP VESOCCLUDE MED 6/CT (CLIP) ×3 IMPLANT
CLIP VESOCCLUDE SM WIDE 6/CT (CLIP) ×3 IMPLANT
COVER WAND RF STERILE (DRAPES) ×1 IMPLANT
DECANTER SPIKE VIAL GLASS SM (MISCELLANEOUS) ×1 IMPLANT
DERMABOND ADVANCED (GAUZE/BANDAGES/DRESSINGS) ×2
DERMABOND ADVANCED .7 DNX12 (GAUZE/BANDAGES/DRESSINGS) ×1 IMPLANT
DRAPE INCISE IOBAN 66X45 STRL (DRAPES) ×1 IMPLANT
ELECT REM PT RETURN 9FT ADLT (ELECTROSURGICAL) ×3
ELECTRODE REM PT RTRN 9FT ADLT (ELECTROSURGICAL) ×1 IMPLANT
GLOVE BIO SURGEON STRL SZ 6.5 (GLOVE) ×2 IMPLANT
GLOVE BIO SURGEON STRL SZ7.5 (GLOVE) ×3 IMPLANT
GLOVE BIO SURGEONS STRL SZ 6.5 (GLOVE) ×2
GLOVE BIOGEL PI IND STRL 6.5 (GLOVE) IMPLANT
GLOVE BIOGEL PI IND STRL 8 (GLOVE) ×1 IMPLANT
GLOVE BIOGEL PI INDICATOR 6.5 (GLOVE) ×4
GLOVE BIOGEL PI INDICATOR 8 (GLOVE) ×2
GOWN STRL REUS W/ TWL LRG LVL3 (GOWN DISPOSABLE) ×3 IMPLANT
GOWN STRL REUS W/TWL LRG LVL3 (GOWN DISPOSABLE) ×9
GRAFT VASCULAR 7X40 (Vascular Products) ×2 IMPLANT
HEMOSTAT SNOW SURGICEL 2X4 (HEMOSTASIS) ×2 IMPLANT
KIT BASIN OR (CUSTOM PROCEDURE TRAY) ×3 IMPLANT
KIT TURNOVER KIT B (KITS) ×3 IMPLANT
NDL HYPO 25GX1X1/2 BEV (NEEDLE) ×1 IMPLANT
NEEDLE HYPO 25GX1X1/2 BEV (NEEDLE) ×3 IMPLANT
NS IRRIG 1000ML POUR BTL (IV SOLUTION) ×3 IMPLANT
PACK CV ACCESS (CUSTOM PROCEDURE TRAY) ×3 IMPLANT
PAD ARMBOARD 7.5X6 YLW CONV (MISCELLANEOUS) ×6 IMPLANT
SPONGE SURGIFOAM ABS GEL 100 (HEMOSTASIS) ×2 IMPLANT
SUT PROLENE 6 0 BV (SUTURE) ×10 IMPLANT
SUT VIC AB 3-0 SH 27 (SUTURE) ×3
SUT VIC AB 3-0 SH 27X BRD (SUTURE) ×2 IMPLANT
SUT VIC AB 4-0 PS2 18 (SUTURE) ×2 IMPLANT
SUT VICRYL 4-0 PS2 18IN ABS (SUTURE) ×4 IMPLANT
TOWEL GREEN STERILE (TOWEL DISPOSABLE) ×3 IMPLANT
UNDERPAD 30X30 (UNDERPADS AND DIAPERS) ×3 IMPLANT
WATER STERILE IRR 1000ML POUR (IV SOLUTION) ×3 IMPLANT

## 2019-12-13 NOTE — Transfer of Care (Signed)
Immediate Anesthesia Transfer of Care Note  Patient: Lakima Dona Rorie  Procedure(s) Performed: REVISION OF ARTERIOVENOUS GORETEX GRAFT RIGHT FOREARM (Right )  Patient Location: PACU  Anesthesia Type:MAC  Level of Consciousness: awake, alert  and oriented  Airway & Oxygen Therapy: Patient Spontanous Breathing and Patient connected to nasal cannula oxygen  Post-op Assessment: Report given to RN, Post -op Vital signs reviewed and stable and Patient moving all extremities  Post vital signs: Reviewed and stable  Last Vitals:  Vitals Value Taken Time  BP 143/59 12/13/19 1546  Temp    Pulse 76 12/13/19 1550  Resp 15 12/13/19 1550  SpO2 100 % 12/13/19 1550  Vitals shown include unvalidated device data.  Last Pain:  Vitals:   12/13/19 0707  PainSc: 0-No pain      Patients Stated Pain Goal: 8 (80/99/83 3825)  Complications: No apparent anesthesia complications

## 2019-12-13 NOTE — Progress Notes (Signed)
Nurse at [facility] stated she gave patient applesauce with 4 medications this am @ 0415.  Notified Dr. Christella Hartigan, stated surgery cannot be done for 6 hours. Rolla Flatten R.N. informed Dr. Scot Dock.

## 2019-12-13 NOTE — Anesthesia Procedure Notes (Signed)
Procedure Name: MAC Date/Time: 12/13/2019 1:41 PM Performed by: Amadeo Garnet, CRNA Pre-anesthesia Checklist: Patient identified, Emergency Drugs available, Suction available and Patient being monitored Patient Re-evaluated:Patient Re-evaluated prior to induction Oxygen Delivery Method: Simple face mask Induction Type: IV induction Placement Confirmation: positive ETCO2 Dental Injury: Teeth and Oropharynx as per pre-operative assessment

## 2019-12-13 NOTE — Anesthesia Postprocedure Evaluation (Signed)
Anesthesia Post Note  Patient: [name] James  Procedure(s) Performed: REVISION OF ARTERIOVENOUS GORETEX GRAFT RIGHT FOREARM (Right )     Patient location during evaluation: PACU Anesthesia Type: MAC Level of consciousness: awake and alert Pain management: pain level controlled Vital Signs Assessment: post-procedure vital signs reviewed and stable Respiratory status: spontaneous breathing, nonlabored ventilation, respiratory function stable and patient connected to nasal cannula oxygen Cardiovascular status: stable and blood pressure returned to baseline Postop Assessment: no apparent nausea or vomiting Anesthetic complications: no    Last Vitals:  Vitals:   12/13/19 1546 12/13/19 1601  BP: (!) 143/59 (!) 110/50  Pulse: 73 78  Resp: 18 17  Temp: 36.7 C 36.7 C  SpO2: 100% 100%    Last Pain:  Vitals:   12/13/19 1601  PainSc: 0-No pain                 Effie Berkshire

## 2019-12-13 NOTE — Op Note (Signed)
    NAME: [name] James    MRN: [id number] DOB: August 23, 1936    DATE OF OPERATION: 12/13/2019  PREOP DIAGNOSIS:    Ulceration overlying the venous aspect of right forearm AV graft  POSTOP DIAGNOSIS:    Same  PROCEDURE:    Revision of right forearm AV graft Replacement of venous half of graft with 7 mm PTFE  SURGEON: Judeth Cornfield. Scot Dock, MD  ASSIST: Leontine Locket, PA  ANESTHESIA: Local with sedation  EBL: Minimal  INDICATIONS:    [name] is a 83 y.o. female who developed ulceration along the venous aspect of her graft and it was felt that this was risk for infection or bleeding she presents for revision of her graft.  FINDINGS:   Good thrill at the completion of the procedure.  TECHNIQUE:   The patient was taken to the operating room and sedated by anesthesia.  The right upper extremity was prepped and draped in usual sterile fashion.  After the skin was anesthetized with 1% lidocaine an incision was made over the venous aspect of the graft just below the antecubital level.  The graft care was dissected free.  At the distal loop again an incision was made over the venous half of the graft after the skin was anesthetized.  The graft here was dissected free.  A tunnel was created between the 2 incisions after the skin was anesthetized and a 7 mm PTFE graft tunneled between the 2 incisions.  The patient was heparinized.  The graft was clamped at both ends and divided.  The graft was then sewn into into the new graft at each end with running 6-0 Prolene suture.  At the completion there was a good thrill in the graft.  I then went to the ulcerated segment and this ulcer was excised and the underlying graft excised.  Hemostasis was obtained in the wound.  This wound was closed with a 4-0 Vicryl.  Next hemostasis was obtained in both incisions overlying the graft.  Once hemostasis was obtained these were closed with a deep layer of 3-0 Vicryl to skin closed with 4-0 Vicryl.   Dermabond was applied.  The patient tolerated the procedure well was transferred to recovery room in stable condition.  All needle and sponge counts were correct.  [name], MD, FACS [facility]  DATE OF DICTATION:   12/13/2019

## 2019-12-13 NOTE — Anesthesia Preprocedure Evaluation (Signed)
Anesthesia Evaluation  Patient identified by MRN, date of birth, ID band Patient awake    Reviewed: Allergy & Precautions, NPO status , Patient's Chart, lab work & pertinent test results  History of Anesthesia Complications (+) PONVNegative for: history of anesthetic complications  Airway Mallampati: II  TM Distance: >3 FB Neck ROM: Full    Dental  (+) Partial Lower, Partial Upper   Pulmonary  Home O2   Pulmonary exam normal        Cardiovascular hypertension, + Peripheral Vascular Disease and +CHF  Normal cardiovascular exam+ dysrhythmias Atrial Fibrillation      Neuro/Psych PSYCHIATRIC DISORDERS Anxiety negative neurological ROS     GI/Hepatic Neg liver ROS, GERD  ,  Endo/Other  diabetes, Type 2  Renal/GU ESRF and DialysisRenal disease  negative genitourinary   Musculoskeletal negative musculoskeletal ROS (+)   Abdominal   Peds  Hematology  (+) anemia ,   Anesthesia Other Findings   Reproductive/Obstetrics                           Anesthesia Physical Anesthesia Plan  ASA: IV  Anesthesia Plan: MAC   Post-op Pain Management:    Induction: Intravenous  PONV Risk Score and Plan: 3 and Propofol infusion, TIVA and Treatment may vary due to age or medical condition  Airway Management Planned: Natural Airway, Nasal Cannula and Simple Face Mask  Additional Equipment: None  Intra-op Plan:   Post-operative Plan:   Informed Consent: I have reviewed the patients History and Physical, chart, labs and discussed the procedure including the risks, benefits and alternatives for the proposed anesthesia with the patient or authorized representative who has indicated his/her understanding and acceptance.       Plan Discussed with:   Anesthesia Plan Comments:        Anesthesia Quick Evaluation

## 2019-12-13 NOTE — Interval H&P Note (Signed)
History and Physical Interval Note:  12/13/2019 7:31 AM  [name] James  has presented today for surgery, with the diagnosis of MECHANICAL COMPLICATION OF ARTERIOVENOUS GRAFT.  The various methods of treatment have been discussed with the patient and family. After consideration of risks, benefits and other options for treatment, the patient has consented to  Procedure(s): REVISION OF ARTERIOVENOUS GORETEX GRAFT (Right) as a surgical intervention.  The patient's history has been reviewed, patient examined, no change in status, stable for surgery.  I have reviewed the patient's chart and labs.  Questions were answered to the patient's satisfaction.     [name]

## 2019-12-13 NOTE — Progress Notes (Signed)
Called & updated patient's daughter Coralyn Mark about delay in surgery. Patient's transportation service, [employer] also called and updated about delay.

## 2019-12-13 NOTE — Discharge Instructions (Signed)
[facility]  Discharge Instructions  AV Fistula or Graft Surgery for Dialysis Access  Please refer to the following instructions for your post-procedure care. Your surgeon or physician assistant will discuss any changes with you.  Activity  You may drive the day following your surgery, if you are comfortable and no longer taking prescription pain medication. Resume full activity as the soreness in your incision resolves.  Bathing/Showering  You may shower after you go home. Keep your incision dry for 48 hours. Do not soak in a bathtub, hot tub, or swim until the incision heals completely. You may not shower if you have a hemodialysis catheter.  Incision Care  Clean your incision with mild soap and water after 48 hours. Pat the area dry with a clean towel. You do not need a bandage unless otherwise instructed. Do not apply any ointments or creams to your incision. You may have skin glue on your incision. Do not peel it off. It will come off on its own in about one week. Your arm may swell a bit after surgery. To reduce swelling use pillows to elevate your arm so it is above your heart. Your doctor will tell you if you need to lightly wrap your arm with an ACE bandage.  Diet  Resume your normal diet. There are not special food restrictions following this procedure. In order to heal from your surgery, it is CRITICAL to get adequate nutrition. Your body requires vitamins, minerals, and protein. Vegetables are the best source of vitamins and minerals. Vegetables also provide the perfect balance of protein. Processed food has little nutritional value, so try to avoid this.  Medications  Resume taking all of your medications. If your incision is causing pain, you may take over-the counter pain relievers such as acetaminophen (Tylenol). If you were prescribed a stronger pain medication, please be aware these medications can cause nausea and constipation. Prevent  nausea by taking the medication with a snack or meal. Avoid constipation by drinking plenty of fluids and eating foods with high amount of fiber, such as fruits, vegetables, and grains.  Do not take Tylenol if you are taking prescription pain medications.  Follow up Your surgeon may want to see you in the office following your access surgery. If so, this will be arranged at the time of your surgery.  Please call us immediately for any of the following conditions:   Increased pain, redness, drainage (pus) from your incision site  Fever of 101 degrees or higher  Severe or worsening pain at your incision site  Hand pain or numbness.   Reduce your risk of vascular disease:   Stop smoking. If you would like help, call [facility] at 1-800-QUIT-NOW 213-873-5389) or [place] at [place] your cholesterol  Maintain a desired weight  Control your diabetes  Keep your blood pressure down  Dialysis  It will take several weeks to several months for your new dialysis access to be ready for use. Your surgeon will determine when it is okay to use it. Your nephrologist will continue to direct your dialysis. You can continue to use your Permcath until your new access is ready for use.   12/13/2019 [name] James [id number] 1936-04-13  Surgeon(s): Angelia Mould, MD  Procedure(s): REVISION OF ARTERIOVENOUS GORETEX GRAFT RIGHT FOREARM   x May stick graft on designated area only:  Do NOT stick medial side of graft for 4 weeks. May stick lateral side of graft immediately.  SEE DIAGRAM.    If you have any questions, please call the office at [phone].

## 2019-12-14 ENCOUNTER — Encounter: Payer: Self-pay | Admitting: *Deleted

## 2019-12-14 ENCOUNTER — Ambulatory Visit (HOSPITAL_COMMUNITY): Payer: Medicare Other

## 2019-12-14 MED FILL — Thrombin (Recombinant) For Soln 20000 Unit: CUTANEOUS | Qty: 1 | Status: AC

## 2019-12-20 ENCOUNTER — Ambulatory Visit
Admission: RE | Admit: 2019-12-20 | Discharge: 2019-12-20 | Disposition: A | Discharge status: Home / Self Care | Payer: Medicare Other | Source: Ambulatory Visit | Attending: Family Medicine | Admitting: Family Medicine

## 2019-12-20 ENCOUNTER — Other Ambulatory Visit: Payer: Self-pay

## 2019-12-20 DIAGNOSIS — R079 Chest pain, unspecified: Secondary | ICD-10-CM

## 2019-12-20 DIAGNOSIS — J189 Pneumonia, unspecified organism: Secondary | ICD-10-CM

## 2019-12-29 ENCOUNTER — Other Ambulatory Visit: Payer: Self-pay

## 2019-12-29 ENCOUNTER — Other Ambulatory Visit: Payer: Medicare Other

## 2019-12-29 ENCOUNTER — Ambulatory Visit (HOSPITAL_COMMUNITY)
Admission: RE | Admit: 2019-12-29 | Discharge: 2019-12-29 | Disposition: A | Discharge status: Home / Self Care | Payer: Medicare Other | Source: Ambulatory Visit | Attending: Internal Medicine | Admitting: Internal Medicine

## 2019-12-29 DIAGNOSIS — R1310 Dysphagia, unspecified: Secondary | ICD-10-CM | POA: Diagnosis present

## 2020-01-03 ENCOUNTER — Other Ambulatory Visit: Payer: Self-pay

## 2020-01-03 ENCOUNTER — Ambulatory Visit (HOSPITAL_COMMUNITY)
Admission: RE | Admit: 2020-01-03 | Discharge: 2020-01-03 | Disposition: A | Discharge status: Home / Self Care | Payer: Medicare Other | Source: Ambulatory Visit | Attending: Family Medicine | Admitting: Family Medicine

## 2020-01-03 DIAGNOSIS — R079 Chest pain, unspecified: Secondary | ICD-10-CM | POA: Diagnosis present

## 2020-01-03 DIAGNOSIS — J189 Pneumonia, unspecified organism: Secondary | ICD-10-CM | POA: Diagnosis present

## 2020-01-03 NOTE — Telephone Encounter (Addendum)
Pt called to ask about what meds we have her on for BP.. we reviewed her med list and she says there is another she thought she was on but the Nurse there at [facility] advised her that she did not have any other meds besides what what we have on her med list. Pt says she takes it at Dialysis and will talk with the nurse there to determine if she is missing any meds from her med list there that they need her to take while she is there. She will let us know if she has any other problems or questions. PT is recovering from [place] and will call to make a follow up with Dr. Sallyanne Kuster.

## 2020-01-25 ENCOUNTER — Other Ambulatory Visit: Payer: Self-pay

## 2020-02-02 ENCOUNTER — Other Ambulatory Visit (HOSPITAL_COMMUNITY)
Admission: RE | Admit: 2020-02-02 | Discharge: 2020-02-02 | Disposition: A | Discharge status: Home / Self Care | Payer: Medicare Other | Source: Ambulatory Visit | Attending: Vascular Surgery | Admitting: Vascular Surgery

## 2020-02-02 ENCOUNTER — Other Ambulatory Visit (HOSPITAL_COMMUNITY): Payer: Medicare Other

## 2020-02-02 DIAGNOSIS — Z01812 Encounter for preprocedural laboratory examination: Secondary | ICD-10-CM | POA: Insufficient documentation

## 2020-02-02 DIAGNOSIS — Z20822 Contact with and (suspected) exposure to covid-19: Secondary | ICD-10-CM | POA: Diagnosis not present

## 2020-02-02 LAB — SARS CORONAVIRUS 2 (TAT 6-24 HRS): SARS Coronavirus 2: NEGATIVE

## 2020-02-03 ENCOUNTER — Ambulatory Visit (HOSPITAL_COMMUNITY)
Admission: RE | Admit: 2020-02-03 | Discharge: 2020-02-03 | Disposition: A | Discharge status: Home / Self Care | Payer: Medicare Other | Attending: Vascular Surgery | Admitting: Vascular Surgery

## 2020-02-03 ENCOUNTER — Encounter (HOSPITAL_COMMUNITY)
Admission: RE | Disposition: A | Discharge status: Home / Self Care | Payer: Self-pay | Source: Home / Self Care | Attending: Vascular Surgery

## 2020-02-03 ENCOUNTER — Other Ambulatory Visit: Payer: Self-pay

## 2020-02-03 DIAGNOSIS — I480 Paroxysmal atrial fibrillation: Secondary | ICD-10-CM | POA: Diagnosis not present

## 2020-02-03 DIAGNOSIS — M109 Gout, unspecified: Secondary | ICD-10-CM | POA: Diagnosis not present

## 2020-02-03 DIAGNOSIS — T82858A Stenosis of vascular prosthetic devices, implants and grafts, initial encounter: Secondary | ICD-10-CM | POA: Insufficient documentation

## 2020-02-03 DIAGNOSIS — Z8249 Family history of ischemic heart disease and other diseases of the circulatory system: Secondary | ICD-10-CM | POA: Diagnosis not present

## 2020-02-03 DIAGNOSIS — Z8719 Personal history of other diseases of the digestive system: Secondary | ICD-10-CM | POA: Diagnosis not present

## 2020-02-03 DIAGNOSIS — N186 End stage renal disease: Secondary | ICD-10-CM | POA: Insufficient documentation

## 2020-02-03 DIAGNOSIS — Z833 Family history of diabetes mellitus: Secondary | ICD-10-CM | POA: Insufficient documentation

## 2020-02-03 DIAGNOSIS — K219 Gastro-esophageal reflux disease without esophagitis: Secondary | ICD-10-CM | POA: Diagnosis not present

## 2020-02-03 DIAGNOSIS — Z881 Allergy status to other antibiotic agents status: Secondary | ICD-10-CM | POA: Insufficient documentation

## 2020-02-03 DIAGNOSIS — E1122 Type 2 diabetes mellitus with diabetic chronic kidney disease: Secondary | ICD-10-CM | POA: Insufficient documentation

## 2020-02-03 DIAGNOSIS — I132 Hypertensive heart and chronic kidney disease with heart failure and with stage 5 chronic kidney disease, or end stage renal disease: Secondary | ICD-10-CM | POA: Diagnosis not present

## 2020-02-03 DIAGNOSIS — Z6821 Body mass index (BMI) 21.0-21.9, adult: Secondary | ICD-10-CM | POA: Diagnosis not present

## 2020-02-03 DIAGNOSIS — E1142 Type 2 diabetes mellitus with diabetic polyneuropathy: Secondary | ICD-10-CM | POA: Diagnosis not present

## 2020-02-03 DIAGNOSIS — K589 Irritable bowel syndrome without diarrhea: Secondary | ICD-10-CM | POA: Insufficient documentation

## 2020-02-03 DIAGNOSIS — Z880 Allergy status to penicillin: Secondary | ICD-10-CM | POA: Insufficient documentation

## 2020-02-03 DIAGNOSIS — Z885 Allergy status to narcotic agent status: Secondary | ICD-10-CM | POA: Diagnosis not present

## 2020-02-03 DIAGNOSIS — Z992 Dependence on renal dialysis: Secondary | ICD-10-CM | POA: Insufficient documentation

## 2020-02-03 DIAGNOSIS — Z9981 Dependence on supplemental oxygen: Secondary | ICD-10-CM | POA: Insufficient documentation

## 2020-02-03 DIAGNOSIS — E785 Hyperlipidemia, unspecified: Secondary | ICD-10-CM | POA: Diagnosis not present

## 2020-02-03 DIAGNOSIS — Y841 Kidney dialysis as the cause of abnormal reaction of the patient, or of later complication, without mention of misadventure at the time of the procedure: Secondary | ICD-10-CM | POA: Diagnosis not present

## 2020-02-03 DIAGNOSIS — Z853 Personal history of malignant neoplasm of breast: Secondary | ICD-10-CM | POA: Insufficient documentation

## 2020-02-03 DIAGNOSIS — Z794 Long term (current) use of insulin: Secondary | ICD-10-CM | POA: Diagnosis not present

## 2020-02-03 DIAGNOSIS — I509 Heart failure, unspecified: Secondary | ICD-10-CM | POA: Insufficient documentation

## 2020-02-03 DIAGNOSIS — E1136 Type 2 diabetes mellitus with diabetic cataract: Secondary | ICD-10-CM | POA: Diagnosis not present

## 2020-02-03 DIAGNOSIS — F419 Anxiety disorder, unspecified: Secondary | ICD-10-CM | POA: Diagnosis not present

## 2020-02-03 DIAGNOSIS — T82898A Other specified complication of vascular prosthetic devices, implants and grafts, initial encounter: Secondary | ICD-10-CM | POA: Diagnosis not present

## 2020-02-03 HISTORY — PX: A/V FISTULAGRAM: CATH118298

## 2020-02-03 HISTORY — PX: PERIPHERAL VASCULAR BALLOON ANGIOPLASTY: CATH118281

## 2020-02-03 LAB — POCT I-STAT, CHEM 8
BUN: 63 mg/dL — ABNORMAL HIGH (ref 8–23)
Calcium, Ion: 0.98 mmol/L — ABNORMAL LOW (ref 1.15–1.40)
Chloride: 104 mmol/L (ref 98–111)
Creatinine, Ser: 6.6 mg/dL — ABNORMAL HIGH (ref 0.44–1.00)
Glucose, Bld: 82 mg/dL (ref 70–99)
HCT: 42 % (ref 36.0–46.0)
Hemoglobin: 14.3 g/dL (ref 12.0–15.0)
Potassium: 5.4 mmol/L — ABNORMAL HIGH (ref 3.5–5.1)
Sodium: 137 mmol/L (ref 135–145)
TCO2: 24 mmol/L (ref 22–32)

## 2020-02-03 SURGERY — A/V FISTULAGRAM
Anesthesia: LOCAL | Laterality: Right

## 2020-02-03 MED ORDER — IODIXANOL 320 MG/ML IV SOLN
INTRAVENOUS | Status: DC | PRN
  Administered 2020-02-03: 65 mL

## 2020-02-03 MED ORDER — HEPARIN (PORCINE) IN NACL 1000-0.9 UT/500ML-% IV SOLN
INTRAVENOUS | Status: DC | PRN
  Administered 2020-02-03: 500 mL

## 2020-02-03 MED ORDER — LIDOCAINE HCL (PF) 1 % IJ SOLN
INTRAMUSCULAR | Status: DC | PRN
  Administered 2020-02-03: 2 mL

## 2020-02-03 MED ORDER — HEPARIN SODIUM (PORCINE) 1000 UNIT/ML IJ SOLN
INTRAMUSCULAR | Status: DC | PRN
  Administered 2020-02-03: 2000 [IU] via INTRAVENOUS

## 2020-02-03 MED ORDER — HEPARIN (PORCINE) IN NACL 1000-0.9 UT/500ML-% IV SOLN
INTRAVENOUS | Status: AC
  Filled 2020-02-03: qty 500

## 2020-02-03 MED ORDER — FENTANYL CITRATE (PF) 100 MCG/2ML IJ SOLN
INTRAMUSCULAR | Status: DC | PRN
  Administered 2020-02-03: 25 ug via INTRAVENOUS

## 2020-02-03 MED ORDER — MIDAZOLAM HCL 2 MG/2ML IJ SOLN
INTRAMUSCULAR | Status: AC
  Filled 2020-02-03: qty 2

## 2020-02-03 MED ORDER — HEPARIN SODIUM (PORCINE) 1000 UNIT/ML IJ SOLN
INTRAMUSCULAR | Status: AC
  Filled 2020-02-03: qty 1

## 2020-02-03 MED ORDER — MIDAZOLAM HCL 2 MG/2ML IJ SOLN
INTRAMUSCULAR | Status: DC | PRN
  Administered 2020-02-03: 1 mg via INTRAVENOUS

## 2020-02-03 MED ORDER — FENTANYL CITRATE (PF) 100 MCG/2ML IJ SOLN
INTRAMUSCULAR | Status: AC
  Filled 2020-02-03: qty 2

## 2020-02-03 MED ORDER — LIDOCAINE HCL (PF) 1 % IJ SOLN
INTRAMUSCULAR | Status: AC
  Filled 2020-02-03: qty 30

## 2020-02-03 SURGICAL SUPPLY — 19 items
BAG SNAP BAND KOVER 36X36 (MISCELLANEOUS) ×2 IMPLANT
BALLN MUSTANG 6.0X40 75 (BALLOONS) ×2
BALLN MUSTANG 7.0X40 75 (BALLOONS) ×2
BALLN MUSTANG 8.0X40 75 (BALLOONS) ×2
BALLOON MUSTANG 6.0X40 75 (BALLOONS) IMPLANT
BALLOON MUSTANG 7.0X40 75 (BALLOONS) IMPLANT
BALLOON MUSTANG 8.0X40 75 (BALLOONS) IMPLANT
COVER DOME SNAP 22 D (MISCELLANEOUS) ×2 IMPLANT
KIT ENCORE 26 ADVANTAGE (KITS) ×1 IMPLANT
KIT MICROPUNCTURE NIT STIFF (SHEATH) ×1 IMPLANT
PATCH THROMBIX TOPICAL PLAIN (HEMOSTASIS) ×1 IMPLANT
PROTECTION STATION PRESSURIZED (MISCELLANEOUS) ×2
SHEATH PINNACLE R/O II 6F 4CM (SHEATH) ×1 IMPLANT
SHEATH PROBE COVER 6X72 (BAG) ×2 IMPLANT
STATION PROTECTION PRESSURIZED (MISCELLANEOUS) ×1 IMPLANT
STOPCOCK MORSE 400PSI 3WAY (MISCELLANEOUS) ×2 IMPLANT
TRAY PV CATH (CUSTOM PROCEDURE TRAY) ×2 IMPLANT
TUBING CIL FLEX 10 FLL-RA (TUBING) ×2 IMPLANT
WIRE BENTSON .035X145CM (WIRE) ×1 IMPLANT

## 2020-02-03 NOTE — Discharge Instructions (Signed)

## 2020-02-03 NOTE — Op Note (Signed)
   PATIENT: [name] James      MRN: [id number] DOB: 11/09/1936    DATE OF PROCEDURE: 02/03/2020  INDICATIONS:    [name] James is a 84 y.o. female who is had problems with poor clearances during dialysis and also problems with bleeding.  She was set up for a fistulogram to look for an outflow stenosis.  PROCEDURE:    1.  Ultrasound-guided access to the right forearm AV graft 2.  Conscious sedation 3.  Fistulogram right forearm AV graft 4.  Venoplasty of 2 stenoses in the outflow brachial vein A.  70% stenosis above the antecubital level which was addressed with a 7 mm x 4 cm balloon with no residual stenosis B.  99% stenosis in the upper arm brachial vein just below the axilla which was addressed with an 8 mm x 4 cm balloon with no residual stenosis  SURGEON: Judeth Cornfield. Scot Dock, MD, FACS  ANESTHESIA: Local with sedation  EBL: Minimal  TECHNIQUE: The patient was brought to the peripheral vascular lab and was sedated. The period of conscious sedation was 35 minutes.  During that time period, I was present face-to-face 100% of the time.  The patient was administered 1 mg of Versed and 25 mcg of fentanyl. The patient's heart rate, blood pressure, and oxygen saturation were monitored by the nurse continuously during the procedure.  The right arm was prepped and draped in usual sterile fashion.  Under ultrasound guidance, after the skin was anesthetized, I cannulated the distal loop of the graft along the venous side.  The graft was widely patent here with no thrombus.  A real-time image was obtained and sent to the server.  A micropuncture sheath was introduced over the wire.  A fistulogram was then obtained to evaluate the fistula from the point of cannulation to include the central veins.  Because of the 2 high-grade stenoses the dye refluxed into the arterial system and wearable to evaluate the entire graft and the arterial anastomosis which was widely patent.  There were 2 stenoses  which we addressed with venoplasty.  The patient received 2000 units of IV heparin after I exchanged the micropuncture sheath for a 6 French sheath over a Bentson wire.  There was a 70% stenosis just above the antecubital level which was addressed with a 7 mm x 4 cm balloon which was inflated to rated burst pressure for 1 minute with no residual stenosis at the completion.  There was a 99% stenosis just below the axilla which was initially addressed with a 6 mm x 4 cm balloon and then subsequently a 8 mm x 4 cm balloon which was inflated to burst pressure for 1 minute.  There was no residual stenosis.  The thrill in the graft was much improved at the completion of the procedure.  At the completion of the procedure two 4-0 Monocryl's were placed for hemostasis.  Good hemostasis was obtained.  FINDINGS:   1.  No central venous stenosis 2.  2 stenoses in the outflow vein which was successfully addressed with balloon angioplasty as described above 3.  No problems with the body of the graft or the arterial anastomosis  CLINICAL NOTE: The graft can be used as soon as today   [name], MD, FACS [facility] [place] DICTATION:   02/03/2020

## 2020-02-03 NOTE — H&P (Signed)
REASON FOR ADMISSION:    For a fistulogram of the right arm AVG.  ASSESSMENT & PLAN:   END-STAGE RENAL DISEASE: The patient has not been getting adequate clearances from her right arm graft.  This reason I recommended a fistulogram.  We have discussed the indications for the procedure and the potential complications and she is agreeable to proceed.  She dialyzes on Monday Wednesdays and Fridays but states she does not want to dialyze until next Monday.  [name], MD Office: [phone]   HPI:   [name] James is a pleasant 84 y.o. female, who had seen in April of last year with a left forearm graft that apparently was not working well.  The patient resides at [facility].  The patient did have a fistulogram in January of last year which showed no central venous stenosis on the right.  There was a weblike stenosis in the high brachial vein with a moderate stenosis distal to that which was successfully addressed with balloon angioplasty using a 7 mm balloon.  There was a moderate 60% stenosis above the antecubital level which was also ballooned with a 7 mm balloon.   On 03/29/2019, the patient underwent fistulogram of her right forearm graft with balloon angioplasty up to a 7 mm balloon of a stenosis in the right brachial vein.  Most recently on 12/13/2019 the patient had revision of her right forearm AV graft.  I replaced the venous half of the graft with 7 mm PTFE.  This was done because there was ulceration overlying the venous half of the graft.  Past Medical History:  Diagnosis Date  . Anemia, chronic disease   . Anxiety   . Arthritis    knees  . Atrial fibrillation ([place])   . Barrett esophagus   . Benign paroxysmal positional vertigo   . Breast cancer ([place]) 1980s   bil mastectomies, no radiation or chemo  . Cataract   . Chest pain, atypical 12/05/2008   R/Lmv- normal perfusion all regions, noe ECG changes   . CHF (congestive heart failure) ([place]) 05/13/2011   echo -  JJ>88%; stage 1 diastolic dysfunction; elevated LV filling pressure, MAC  . CKD (chronic kidney disease), stage III   . Claudication ([place]) 10/30/2005   doppler - normal evaluation, no evidence of aneurysm, diameter reduction, dissection, compression or vascular abnormality)  . Complication of anesthesia   . Controlled type 2 diabetes mellitus with diabetic polyneuropathy, with long-term current use of insulin ([place]) 04/17/2016  . Dyslipidemia   . Dysrhythmia    Atrial Fibrillation  . Edema   . ESRD (end stage renal disease) on dialysis ([place])    "[facility]; [address]; MWF" (07/06/2018)  . Family history of adverse reaction to anesthesia    son also has nausea   . Fever blister 12/2016  . Gastritis and gastroduodenitis   . GERD (gastroesophageal reflux disease)   . Gout   . Hemodialysis-associated hypotension   . HLD (hyperlipidemia)   . Hypertension   . IBS (irritable bowel syndrome)   . Insomnia   . Iron deficiency anemia   . Junctional bradycardia 08/25/2018  . LFT elevation   . Long term current use of amiodarone 10/17/2016  . Morbid (severe) obesity due to excess calories ([place]) 04/29/2017  . Morbid obesity ([place])   . On home oxygen therapy    "2L; 24/7" (07/06/2018)  . PAF (paroxysmal atrial fibrillation) ([place])   . Peripheral neuropathy   . Pneumonia 12/2016  . PONV (postoperative  nausea and vomiting)   . Renal cyst   . Renal insufficiency 10/30/2005   doppler - abn resistance consistent w/ parenchymal disease  . Renovascular hypertension   . Type 2 diabetes mellitus with diabetic neuropathy, with long-term current use of insulin ([place])   . Vertigo     Family History  Problem Relation Age of Onset  . Diabetes Mother   . Heart disease Mother   . Non-Hodgkin's lymphoma Mother   . Heart attack Father   . Stroke Father   . Parkinson's disease Brother   . Lung disease Brother   . Heart disease Brother   . Multiple myeloma Sister     SOCIAL HISTORY: Social History    Socioeconomic History  . Marital status: Divorced    Spouse name: Not on file  . Number of children: Not on file  . Years of education: Not on file  . Highest education level: Not on file  Occupational History  . Not on file  Tobacco Use  . Smoking status: Never Smoker  . Smokeless tobacco: Never Used  Substance and Sexual Activity  . Alcohol use: No  . Drug use: No  . Sexual activity: Not on file  Other Topics Concern  . Not on file  Social History Narrative   [facility] (10/06/17):   Originally from [facility] Has always lived in [place]. She currently lives in [place]. No bird exposure. [place] Previously traveled to [place], [place], & [place]. Previously worked in a nursery and also a Pharmacist, community.    Social Determinants of Health   Financial Resource Strain:   . Difficulty of Paying Living Expenses: Not on file  Food Insecurity:   . Worried About Charity fundraiser in the Last Year: Not on file  . Ran Out of Food in the Last Year: Not on file  Transportation Needs:   . Lack of Transportation (Medical): Not on file  . Lack of Transportation (Non-Medical): Not on file  Physical Activity:   . Days of Exercise per Week: Not on file  . Minutes of Exercise per Session: Not on file  Stress:   . Feeling of Stress : Not on file  Social Connections:   . Frequency of Communication with Friends and Family: Not on file  . Frequency of Social Gatherings with Friends and Family: Not on file  . Attends Religious Services: Not on file  . Active Member of Clubs or Organizations: Not on file  . Attends Archivist Meetings: Not on file  . Marital Status: Not on file  Intimate Partner Violence:   . Fear of Current or Ex-Partner: Not on file  . Emotionally Abused: Not on file  . Physically Abused: Not on file  . Sexually Abused: Not on file    Allergies  Allergen Reactions  . Erythromycin Swelling  . Codeine Palpitations, Rash and Other (See Comments)    GI  Upset  . Erythromycin Base Other (See Comments)    GI Upset  . Penicillins Rash and Other (See Comments)    Has taken Keflex & Rocephin many times without problems Documented on MAR  . Zantac [Ranitidine Hcl] Nausea And Vomiting    No current facility-administered medications for this encounter.    REVIEW OF SYSTEMS:  _0  denotes positive finding, _1  denotes negative finding Cardiac  Comments:  Chest pain or chest pressure:    Shortness of breath upon exertion: x   Short of breath when  lying flat:    Irregular heart rhythm:        Vascular    Pain in calf, thigh, or hip brought on by ambulation:    Pain in feet at night that wakes you up from your sleep:     Blood clot in your veins:    Leg swelling:         Pulmonary    Oxygen at home:    Productive cough:     Wheezing:         Neurologic    Sudden weakness in arms or legs:     Sudden numbness in arms or legs:     Sudden onset of difficulty speaking or slurred speech:    Temporary loss of vision in one eye:     Problems with dizziness:         Gastrointestinal    Blood in stool:     Vomited blood:         Genitourinary    Burning when urinating:     Blood in urine:        Psychiatric    Major depression:         Hematologic    Bleeding problems:    Problems with blood clotting too easily:        Skin    Rashes or ulcers:        Constitutional    Fever or chills:     PHYSICAL EXAM:   Vitals:   02/03/20 0729  BP: (!) 157/61  Pulse: 65  Resp: 16  Temp: (!) 97.5 F (36.4 C)  TempSrc: Oral  SpO2: 100%  Weight: 58.5 kg  Height: _0  (1.651 m)    GENERAL: The patient is a well-nourished female, in no acute distress. The vital signs are documented above. CARDIAC: There is a regular rate and rhythm.  VASCULAR: She has a palpable thrill in her right forearm graft. There is moderate swelling over the venous limb of the graft where it looks like she has had an infiltrate. PULMONARY: There is good  air exchange bilaterally without wheezing or rales. ABDOMEN: Soft and non-tender with normal pitched bowel sounds.  MUSCULOSKELETAL: There are no major deformities or cyanosis. NEUROLOGIC: No focal weakness or paresthesias are detected. SKIN: There are no ulcers or rashes noted. PSYCHIATRIC: The patient has a normal affect.  DATA:    Potassium equals 5.4.

## 2020-02-08 NOTE — Telephone Encounter (Signed)
I am converting 1 of 2 emails I received from the patient's daughter today to a telephone encounter.  Please see both emails if context needed.  Patient is having diarrhea more than usual I would recommend that we do stool studies at her living facility, [facility] Would recommend GI pathogen panel but if this is not available then stool culture and C. difficile PCR In the interim she can try Lomotil 1 capsule 4 times daily as needed for diarrhea while we wait on stool studies as cholestyramine was interfering with other medications.  Please let the daughter know we are ordering stool studies and a temporary antidiarrheal

## 2020-02-10 ENCOUNTER — Telehealth: Payer: Self-pay | Admitting: Internal Medicine

## 2020-02-10 NOTE — Telephone Encounter (Signed)
Order confirmed for cdiff, lomotil order faxed to [facility] at [phone].

## 2020-02-10 NOTE — Telephone Encounter (Signed)
Pt's RN from care facility called and has questions regarding C. difficile culture.  She also stated that [facility] requires a prescription for Lomotil since it is a control.

## 2020-03-15 ENCOUNTER — Ambulatory Visit: Payer: Medicare Other | Admitting: Cardiovascular Disease

## 2020-04-10 ENCOUNTER — Encounter: Payer: Self-pay | Admitting: Cardiovascular Disease

## 2020-04-10 ENCOUNTER — Other Ambulatory Visit: Payer: Self-pay

## 2020-04-10 ENCOUNTER — Ambulatory Visit (INDEPENDENT_AMBULATORY_CARE_PROVIDER_SITE_OTHER): Payer: Medicare Other | Admitting: Cardiovascular Disease

## 2020-04-10 VITALS — BP 155/69 | HR 64 | Temp 97.1°F | Resp 18 | Ht 65.0 in | Wt 126.5 lb

## 2020-04-10 DIAGNOSIS — I700 Atherosclerosis of aorta: Secondary | ICD-10-CM

## 2020-04-10 DIAGNOSIS — N186 End stage renal disease: Secondary | ICD-10-CM | POA: Diagnosis not present

## 2020-04-10 DIAGNOSIS — I953 Hypotension of hemodialysis: Secondary | ICD-10-CM | POA: Diagnosis not present

## 2020-04-10 DIAGNOSIS — E8809 Other disorders of plasma-protein metabolism, not elsewhere classified: Secondary | ICD-10-CM

## 2020-04-10 DIAGNOSIS — I495 Sick sinus syndrome: Secondary | ICD-10-CM | POA: Diagnosis not present

## 2020-04-10 DIAGNOSIS — I480 Paroxysmal atrial fibrillation: Secondary | ICD-10-CM

## 2020-04-10 DIAGNOSIS — Z992 Dependence on renal dialysis: Secondary | ICD-10-CM

## 2020-04-10 DIAGNOSIS — E46 Unspecified protein-calorie malnutrition: Secondary | ICD-10-CM

## 2020-04-10 NOTE — Patient Instructions (Signed)

## 2020-04-10 NOTE — Progress Notes (Signed)
Cardiology Office Note     Date:  04/10/2020   ID:  [name] James, DOB [birth year], MRN [id number]  PCP:  [facility] [place]  Cardiologist:  Sanda Klein, MD  Electrophysiologist:  None   Evaluation Performed:  Follow-Up Visit  Chief Complaint:  Tachy-brady sd  History of Present Illness:    [name] James is a 84 y.o. female with with a hx of Paroxysmal atrial fibrillation , diabetes mellitus, end-stage renal disease on HD MWF, known aortic and coronary atherosclerosis, but with a normal nuclear stress test and normal left ventricular ejection fraction.   She had COVID-19 infection in May 2020 and did remarkably well with it.  The diagnosis was confirmed after she developed atrial fibrillation rapid ventricular response during hemodialysis, had some issues with nausea and low-grade fever, but she never had respiratory problems and did not lose her sense of smell or taste.  She denies syncope but is very sedentary.  She has not had chest pain or shortness of breath at rest or with the minimal activity that she performs.  She is a resident at [facility] and goes to dialysis 3 days a week.  She is on chronic oxygen at 2 L by nasal cannula.  In the last several months she has had only one episode of palpitations that occurred at rest and lasted for no more than 25 minutes, a couple of weeks ago.  She did not have associated shortness of breath or angina or dizziness.  She continues to do all right with hemodialysis well as long as she receives Midodrine.  She has not had to interrupt dialysis for low blood pressure.  She had a fistulogram (right upper arm) with Dr. Scot Dock recently.    07/06/2018 Echo described mild LVH, LVEF 65-70%, severe left atrial dilatation.  In July she had chest pain and junctional rhythm.  She was evaluated by EP and felt to be poor candidate for a pacemaker due to an occluded left subclavian artery hemodialysis via right arm fistula. Amiodarone and  metoprolol were discontinued.  But in September, she was hospitalized with severe symptomatic junctional bradycardia and hypotension, responsive to atropine and IV fluids.  She was again evaluated by EP (a different provider) and it was felt that a pacemaker should not be implanted, for the same reasons.   She has not had either atrial fibrillation or bradycardia since discharge from the hospital in September.  She requires immediate treatment for symptomatic hypotension. She has a Rx for hydralazine if BP is too high, but rarely needs it.  She is not on anticoagulation since she had problems with GI bleeding. She has been on chronic hemodialysis (MWF, Dr. [name], now Dr. [name]) since 2015. She is a permanent resident at [facility].  Note from 08/24/2018, Dr. [name]: "I have discussed the case with Dr. [name], [name], and [name]. She has symptomatic sinus node dysfunction and a clear cut indication for a PPM. Unfortunately her access is such the left left subclavian was previously used for HD but is now occluded based on the collaterals on her skin, a right arm that is being used for HD which is functioning poorly and will undergo revision with interventional radiology in the next few days. Placement of a PPM in her right femoral vein would be a consideration but examination of the area is particularly discouraging due to her massive obesity and multiple folds and evidence of chronic fungal infection. I strongly suspect that any insertion of a PPM from  the right femoral vein would lead to an cardiac device infection with in a few months. She is quite sedentary and does not walk or transfer and lays in the bed much of the day. Her exam is accurately documented above. Other than an Epicardial PPM, we do not have an option for improving her HR other than possibly adding theophylline or encouraging her to drink more caffeine. She is approaching hospice level care".  And 08/26/2018 "She has no  endovascular access sites. We cannot place an endovascular pacemaker except for a Micra device which is placed via right femoral vein into the RV apex. The placement of a single chamber PM in the RV in a patient with sinus rhythm will result in pacemaker syndrome, will make her feel worse and also make her device dependent. Placement of a standard PM via the right femoral vein will result in a device infection. The area is dirty.   Her only option for physiologic pacing would be to call CVTS and place and epicardial pacing system with an open chest procedure either with median sternotomy or thoracotomy. I feel strongly that she is not a candidate for this procedure as well.   I would consider stopping the timolol eye drops.   In summary there are no good options for pacing this patient. As I noted, medical options that might increase her HR are theophylline. The therapeutic window for these drugs is very small however."     Past Medical History:  Diagnosis Date  . Anemia, chronic disease   . Anxiety   . Arthritis    knees  . Atrial fibrillation ([place])   . Barrett esophagus   . Benign paroxysmal positional vertigo   . Breast cancer ([place]) 1980s   bil mastectomies, no radiation or chemo  . Cataract   . Chest pain, atypical 12/05/2008   R/Lmv- normal perfusion all regions, noe ECG changes   . CHF (congestive heart failure) ([place]) 05/13/2011   echo - AO>13%; stage 1 diastolic dysfunction; elevated LV filling pressure, MAC  . CKD (chronic kidney disease), stage III   . Claudication ([place]) 10/30/2005   doppler - normal evaluation, no evidence of aneurysm, diameter reduction, dissection, compression or vascular abnormality)  . Complication of anesthesia   . Controlled type 2 diabetes mellitus with diabetic polyneuropathy, with long-term current use of insulin ([place]) 04/17/2016  . Dyslipidemia   . Dysrhythmia    Atrial Fibrillation  . Edema   . ESRD (end stage renal disease) on dialysis ([place])     "[facility]; [address]; MWF" (07/06/2018)  . Family history of adverse reaction to anesthesia    son also has nausea   . Fever blister 12/2016  . Gastritis and gastroduodenitis   . GERD (gastroesophageal reflux disease)   . Gout   . Hemodialysis-associated hypotension   . HLD (hyperlipidemia)   . Hypertension   . IBS (irritable bowel syndrome)   . Insomnia   . Iron deficiency anemia   . Junctional bradycardia 08/25/2018  . LFT elevation   . Long term current use of amiodarone 10/17/2016  . Morbid (severe) obesity due to excess calories ([place]) 04/29/2017  . Morbid obesity ([place])   . On home oxygen therapy    "2L; 24/7" (07/06/2018)  . PAF (paroxysmal atrial fibrillation) ([place])   . Peripheral neuropathy   . Pneumonia 12/2016  . PONV (postoperative nausea and vomiting)   . Renal cyst   . Renal insufficiency 10/30/2005   doppler - abn resistance consistent w/  parenchymal disease  . Renovascular hypertension   . Type 2 diabetes mellitus with diabetic neuropathy, with long-term current use of insulin ([place])   . Vertigo    Past Surgical History:  Procedure Laterality Date  . A/V FISTULAGRAM Left 03/19/2018   Procedure: A/V FISTULAGRAM;  Surgeon: Angelia Mould, MD;  Location: [place] [facility];  Service: Cardiovascular;  Laterality: Left;  . A/V FISTULAGRAM N/A 12/24/2018   Procedure: A/V FISTULAGRAM - Right Arm;  Surgeon: Angelia Mould, MD;  Location: [place] [facility];  Service: Cardiovascular;  Laterality: N/A;  . A/V FISTULAGRAM Right 03/29/2019   Procedure: A/V FISTULAGRAM;  Surgeon: Angelia Mould, MD;  Location: [place] [facility];  Service: Cardiovascular;  Laterality: Right;  . A/V FISTULAGRAM Right 02/03/2020   Procedure: A/V FISTULAGRAM;  Surgeon: Angelia Mould, MD;  Location: [place] [facility];  Service: Cardiovascular;  Laterality: Right;  . A/V SHUNTOGRAM Left 01/19/2018   Procedure: A/V SHUNTOGRAM;  Surgeon: Serafina Mitchell, MD;  Location: [place] [facility];  Service: Cardiovascular;  Laterality: Left;  . ANGIOPLASTY Left 02/12/2015   Procedure: ANGIOPLASTY;  Surgeon: Angelia Mould, MD;  Location: [facility] CATH LAB;  Service: Cardiovascular;  Laterality: Left;  AVF  . AV FISTULA PLACEMENT Left 11/30/2014   Procedure: ARTERIOVENOUS (AV) FISTULA CREATION LEFT ARM;  Surgeon: Angelia Mould, MD;  Location: [place];  Service: Vascular;  Laterality: Left;  . AV FISTULA PLACEMENT Left 03/20/2015   Procedure:  Inserton of Left Upper Arm Gortex Graft;  Surgeon: Angelia Mould, MD;  Location: [place];  Service: Vascular;  Laterality: Left;  . AV FISTULA PLACEMENT Left 04/12/2015   Procedure: INSERTION OF LEFT ARM  ARTERIOVENOUS GORE-TEX GRAFT ;  Surgeon: Angelia Mould, MD;  Location: [place];  Service: Vascular;  Laterality: Left;  . AV FISTULA PLACEMENT Right 06/08/2018   Procedure: INSERTION OF ARTERIOVENOUS (AV) GORE-TEX GRAFT ARM USING 4-7MM X 45CM GORETEX GRAFT;  Surgeon: Angelia Mould, MD;  Location: [place];  Service: Vascular;  Laterality: Right;  . BACK SURGERY  ~1980  . CATARACT EXTRACTION Right   . CHOLECYSTECTOMY    . ESOPHAGOGASTRODUODENOSCOPY N/A 10/24/2014   Procedure: ESOPHAGOGASTRODUODENOSCOPY (EGD);  Surgeon: Jerene Bears, MD;  Location[facility] ENDOSCOPY;  Service: Endoscopy;  Laterality: N/A;  . EXCHANGE OF A DIALYSIS CATHETER Left 11/30/2014   Procedure: EXCHANGE OF A DIALYSIS CATHETER, LEFT INTERNAL JUGULAR;  Surgeon: Angelia Mould, MD;  Location: [place];  Service: Vascular;  Laterality: Left;  . EYE SURGERY Left    transplant  . FISTULOGRAM N/A 02/12/2015   Procedure: FISTULOGRAM;  Surgeon: Angelia Mould, MD;  Location: [facility] CATH LAB;  Service: Cardiovascular;  Laterality: N/A;  . HEMORRHOID SURGERY    . IR AV DIALY SHUNT INTRO NEEDLE/INTRACATH INITIAL W/PTA/IMG RIGHT Right 08/25/2018  . IR AV DIALY SHUNT INTRO NEEDLE/INTRACATH INITIAL W/PTA/IMG RIGHT Right 11/17/2018  . IR US GUIDE VASC  ACCESS RIGHT  08/25/2018  . LIGATION ARTERIOVENOUS GORTEX GRAFT Left 08/27/2018   Procedure: LIGATION ARTERIOVENOUS GORTEX GRAFT ARM;  Surgeon: Angelia Mould, MD;  Location: [place];  Service: Vascular;  Laterality: Left;  . LIGATION OF ARTERIOVENOUS  FISTULA Left 03/20/2015   Procedure: LIGATION OF ARTERIOVENOUS  FISTULA;  Surgeon: Angelia Mould, MD;  Location: [place];  Service: Vascular;  Laterality: Left;  Marland Kitchen MASTECTOMY Bilateral ~1982   bil breast reconstruction with implants  . ORIF TIBIA & FIBULA FRACTURES Left 2007   also  had left non displaced malleolar fracture.   Marland Kitchen PERIPHERAL VASCULAR BALLOON ANGIOPLASTY Left 01/19/2018   Procedure: PERIPHERAL VASCULAR BALLOON ANGIOPLASTY;  Surgeon: Serafina Mitchell, MD;  Location: [place] [facility];  Service: Cardiovascular;  Laterality: Left;  fistula  . PERIPHERAL VASCULAR BALLOON ANGIOPLASTY Left 03/19/2018   Procedure: PERIPHERAL VASCULAR BALLOON ANGIOPLASTY;  Surgeon: Angelia Mould, MD;  Location: [place] [facility];  Service: Cardiovascular;  Laterality: Left;  UPPER ARM FISTULA  . PERIPHERAL VASCULAR BALLOON ANGIOPLASTY Right 12/24/2018   Procedure: PERIPHERAL VASCULAR BALLOON ANGIOPLASTY;  Surgeon: Angelia Mould, MD;  Location: [place] [facility];  Service: Cardiovascular;  Laterality: Right;  arm fistula  . PERIPHERAL VASCULAR BALLOON ANGIOPLASTY  03/29/2019   Procedure: PERIPHERAL VASCULAR BALLOON ANGIOPLASTY;  Surgeon: Angelia Mould, MD;  Location: [place] [facility];  Service: Cardiovascular;;  . PERIPHERAL VASCULAR BALLOON ANGIOPLASTY Right 02/03/2020   Procedure: PERIPHERAL VASCULAR BALLOON ANGIOPLASTY;  Surgeon: Angelia Mould, MD;  Location: [place] [facility];  Service: Cardiovascular;  Laterality: Right;  upper arm fistula  . REMOVAL OF GRAFT Left 04/12/2015   Procedure: REMOVAL OF LEFT ARM ARTERIOVENOUS GORE-TEX GRAFT;  Surgeon: Angelia Mould, MD;  Location: [place];  Service: Vascular;   Laterality: Left;  . REVISION OF ARTERIOVENOUS GORETEX GRAFT Right 12/13/2019   Procedure: REVISION OF ARTERIOVENOUS GORETEX GRAFT RIGHT FOREARM;  Surgeon: Angelia Mould, MD;  Location: [facility] OR;  Service: Vascular;  Laterality: Right;     Current Meds  Medication Sig  . acetaminophen (TYLENOL) 325 MG tablet Take 650 mg by mouth every 6 (six) hours as needed for fever.   Marland Kitchen allopurinol (ZYLOPRIM) 100 MG tablet Take 100 mg by mouth daily. (0900)  . aspirin 81 MG chewable tablet Chew 81 mg by mouth daily. (0900)  . B Complex-C-Folic Acid (NEPHRO VITAMINS) 0.8 MG TABS Take 1 tablet by mouth daily. (0900)  . Brinzolamide-Brimonidine (SIMBRINZA) 1-0.2 % SUSP Place 1 drop into both eyes 2 (two) times daily. (0900 & 2100)  . calcium carbonate (TUMS - DOSED IN MG ELEMENTAL CALCIUM) 500 MG chewable tablet Chew 1 tablet by mouth 2 (two) times daily.  . cetirizine (ZYRTEC) 10 MG tablet Take 10 mg by mouth daily.  . cholestyramine (QUESTRAN) 4 g packet Take 1 packet (4 g total) by mouth 2 (two) times daily. Take twice daily before breakfast and dinner (Patient taking differently: Take 4 g by mouth 2 (two) times daily. Take twice daily before breakfast and lunch)  . diazepam (VALIUM) 5 MG tablet Take 1 tablet (5 mg total) by mouth at bedtime.  . diclofenac sodium (VOLTAREN) 1 % GEL Apply 2 g topically at bedtime. Applied to right knee  . DIMETHICONE, TOPICAL, 5 % CREA Apply 1 application topically See admin instructions. Place a nickel-sized amount onto buttock(s) area 2 times a day for barrier protection  . diphenoxylate-atropine (LOMOTIL) 2.5-0.025 MG tablet Take 1 tablet by mouth 4 (four) times daily.  . fluticasone (FLONASE) 50 MCG/ACT nasal spray Place 2 sprays into both nostrils daily.  Marland Kitchen loperamide (IMODIUM A-D) 2 MG tablet Take 2 tablets (4 mg total) by mouth every 8 (eight) hours as needed for diarrhea or loose stools (for diarrhea).  . Melatonin 5 MG CAPS Take 10 mg by mouth at bedtime.  .  metoprolol tartrate (LOPRESSOR) 25 MG tablet Take 25 mg by mouth daily. Hold if hr <60 or SBP>105  . midodrine (PROAMATINE) 2.5 MG tablet Take 2.5 mg by mouth 2 (two) times  daily. Give if bp is < 120/60  . OXYGEN Inhale 2 L into the lungs continuous.   Marland Kitchen SALINE NASAL MIST NA Place 2 sprays into the nose 3 (three) times daily.  . traMADol (ULTRAM) 50 MG tablet Take 0.5 tablets (25 mg total) by mouth every 8 (eight) hours as needed.  . traZODone (DESYREL) 50 MG tablet Take 50 mg by mouth at bedtime.      Allergies:   Erythromycin, Codeine, Erythromycin base, Penicillins, and Zantac [ranitidine hcl]   Social History   Tobacco Use  . Smoking status: Never Smoker  . Smokeless tobacco: Never Used  Substance Use Topics  . Alcohol use: No  . Drug use: No     Family Hx: The patient's family history includes Diabetes in her mother; Heart attack in her father; Heart disease in her brother and mother; Lung disease in her brother; Multiple myeloma in her sister; Non-Hodgkin's lymphoma in her mother; Parkinson's disease in her brother; Stroke in her father.  ROS:   Please see the history of present illness.     All other systems reviewed and are negative.   Prior CV studies:   The following studies were reviewed today:  Notes from emergency room department visit on May 25  Labs/Other Tests and Data Reviewed:    EKG:  An ECG dated 05/16/2019 was personally reviewed today and demonstrated:  Atrial fibrillation with controlled ventricular response and occasional wide-complex beats most likely aberrancy, possible PVCs  Recent Labs: 05/16/2019: Magnesium 1.8; Platelets 152 02/03/2020: BUN 63; Creatinine, Ser 6.60; Hemoglobin 14.3; Potassium 5.4; Sodium 137   Recent Lipid Panel Lab Results  Component Value Date/Time   CHOL 157 08/24/2018 04:23 AM   TRIG 147 08/24/2018 04:23 AM   HDL 32 (L) 08/24/2018 04:23 AM   CHOLHDL 4.9 08/24/2018 04:23 AM   LDLCALC 96 08/24/2018 04:23 AM    Wt  Readings from Last 3 Encounters:  04/10/20 126 lb 8 oz (57.4 kg)  02/03/20 129 lb (58.5 kg)  12/13/19 134 lb 4.2 oz (60.9 kg)     Objective:    Vital Signs:  BP (!) 155/69   Pulse 64   Temp (!) 97.1 F (36.2 C)   Resp 18   Ht _0  (1.651 m)   Wt 126 lb 8 oz (57.4 kg)   SpO2 98%   BMI 21.05 kg/m     General: Alert, oriented x3, no distress, thin, elderly and frail, slightly pale Head: no evidence of trauma, PERRL, EOMI, no exophtalmos or lid lag, no myxedema, no xanthelasma; normal ears, nose and oropharynx Neck: normal jugular venous pulsations and no hepatojugular reflux; brisk carotid pulses without delay and no carotid bruits Chest: clear to auscultation, no signs of consolidation by percussion or palpation, normal fremitus, symmetrical and full respiratory excursions Cardiovascular: normal position and quality of the apical impulse, regular rhythm, normal first and second heart sounds, no murmurs, rubs or gallops Abdomen: no tenderness or distention, no masses by palpation, no abnormal pulsatility or arterial bruits, normal bowel sounds, no hepatosplenomegaly Extremities: no clubbing, cyanosis or edema; 2+ radial, ulnar and brachial pulses bilaterally; 2+ right femoral, posterior tibial and dorsalis pedis pulses; 2+ left femoral, posterior tibial and dorsalis pedis pulses; no subclavian or femoral bruits Neurological: grossly nonfocal Psych: Normal mood and affect    ASSESSMENT & PLAN:    1. AFib:  Episodes appear to be infrequent, relatively brief and not particularly symptomatic.  Avoiding antiarrhythmics due to underlying conduction system abnormalities.  Appears to be tolerating a very low-dose of beta-blocker.  Not a good candidate for anticoagulation.  She is on aspirin. 2. SSS:  With periods of symptomatic bradycardia in July and early September.  Did not tolerate antiarrhythmics. Evaluated by 2 different EP specialists who have deemed her a poor candidate for device  therapy.  Has not had symptoms of bradycardia.   3. Hypotension:  Occasional brief hypotension during dialysis, despite treatment with midodrine.  No symptomatic hypotension or syncope. 4. ESRD:  She has had multiple revisions of her AV axis in both arms.   5. Ao atherosclerosis: Has evidence of peripheral arterial disease as well as coronary calcification on chest CT, thankfully asymptomatic(except for chest pain in July, during arrhythmia). Had a normal nuclear stress test in 2015. LDL<100without meds in September 2019.  She has lost substantial weight and I suspect that her cholesterol level is even lower now.  On aspirin. 6. Hypoalbuminemia: She remains quite undernourished.  Reports having stomach problems that prevent her from drinking Nepro or other supplements.   COVID-19 Education: The signs and symptoms of COVID-19 were discussed with the patient and how to seek care for testing (follow up with PCP or arrange E-visit).  The importance of social distancing was discussed today.  Time:   Today, I have spent 22 minutes with the patient with telehealth technology discussing the above problems.     Medication Adjustments/Labs and Tests Ordered: Current medicines are reviewed at length with the patient today.  Concerns regarding medicines are outlined above.   Tests Ordered: Orders Placed This Encounter  Procedures  . EKG 12-Lead    Medication Changes: No orders of the defined types were placed in this encounter.   Disposition:  Follow up 6 months  Signed, Sanda Klein, MD  04/10/2020 2:30 PM    [place]

## 2020-04-19 ENCOUNTER — Ambulatory Visit: Payer: Self-pay

## 2020-04-19 ENCOUNTER — Encounter: Payer: Self-pay | Admitting: Orthopedic Surgery

## 2020-04-19 ENCOUNTER — Other Ambulatory Visit: Payer: Self-pay

## 2020-04-19 ENCOUNTER — Ambulatory Visit (INDEPENDENT_AMBULATORY_CARE_PROVIDER_SITE_OTHER): Payer: Medicare Other | Admitting: Orthopedic Surgery

## 2020-04-19 VITALS — Ht 65.0 in | Wt 126.0 lb

## 2020-04-19 DIAGNOSIS — M25572 Pain in left ankle and joints of left foot: Secondary | ICD-10-CM

## 2020-04-24 ENCOUNTER — Encounter: Payer: Self-pay | Admitting: Orthopedic Surgery

## 2020-04-24 NOTE — Progress Notes (Signed)
Office Visit Note   Patient: [name] James           Date of Birth: 14-Dec-1936           MRN: [id number] Visit Date: 04/19/2020              Requested by: Patrick Jupiter [id number] [place],  [place] [address] PCP: [facility], [facility]  Chief Complaint  Patient presents with  . Left Ankle - Pain      HPI: Patient is an 84 year old woman who states that her left foot slipped off the foot rest on the wheelchair and rolled under 3 weeks ago.  She complains of pain and swelling and decreased range of motion in the left ankle.  She states it hurts all the time she also complains of painful onychomycotic nails x10.  Assessment & Plan: Visit Diagnoses:  1. Pain in left ankle and joints of left foot     Plan: Nails were trimmed C16 without complications.  Recommended physical therapy for strengthening and conditioning.  Follow-Up Instructions: Return in about 3 months (around 07/19/2020).   Ortho Exam  Patient is alert, oriented, no adenopathy, well-dressed, normal affect, normal respiratory effort. Examination patient has no skin lesions no bruising no ecchymosis around the foot or ankle.  Patient does not have palpable pulses.  Examination of the forefoot shows thickened discolored onychomycotic nails x10 patient is unable to safely trim the nails on her own and the nails were trimmed S06 without complications.  Radiograph shows no evidence of any acute fractures.  Imaging: No results found. No images are attached to the encounter.  Labs: Lab Results  Component Value Date   HGBA1C 4.4 (L) 08/24/2018   HGBA1C 5.7 (H) 01/20/2017   HGBA1C 5.5 10/22/2016   ESRSEDRATE 64 (H) 07/17/2006   REPTSTATUS 03/16/2019 FINAL 03/14/2019   GRAMSTAIN  10/13/2017    CYTOSPIN SMEAR WBC PRESENT,BOTH PMN AND MONONUCLEAR NO ORGANISMS SEEN Performed at [facility], [place] [address], [place], [place] [address]    CULT >=100,000 COLONIES/mL ESCHERICHIA COLI (A) 03/14/2019   LABORGA ESCHERICHIA  COLI (A) 03/14/2019     Lab Results  Component Value Date   ALBUMIN 2.8 (L) 03/14/2019   ALBUMIN 3.0 (L) 03/13/2019   ALBUMIN 3.2 (L) 08/23/2018    Lab Results  Component Value Date   MG 1.8 05/16/2019   MG 2.1 12/19/2014   MG 1.9 11/20/2014   No results found for: VD25OH  No results found for: PREALBUMIN CBC EXTENDED Latest Ref Rng & Units 02/03/2020 12/13/2019 05/16/2019  WBC 4.0 - 10.5 K/uL - - 6.4  RBC 3.87 - 5.11 MIL/uL - - 3.60(L)  HGB 12.0 - 15.0 g/dL 14.3 11.9(L) 11.8(L)  HCT 36.0 - 46.0 % 42.0 35.0(L) 38.1  PLT 150 - 400 K/uL - - 152  NEUTROABS 1.7 - 7.7 K/uL - - 4.6  LYMPHSABS 0.7 - 4.0 K/uL - - 1.5     Body mass index is 20.97 kg/m.  Orders:  Orders Placed This Encounter  Procedures  . XR Ankle 2 Views Left   No orders of the defined types were placed in this encounter.    Procedures: No procedures performed  Clinical Data: No additional findings.  ROS:  All other systems negative, except as noted in the HPI. Review of Systems  Objective: Vital Signs: Ht _0  (1.651 m)   Wt 126 lb (57.2 kg)   BMI 20.97 kg/m   Specialty Comments:  No specialty comments available.  PMFS History: Patient Active Problem List   Diagnosis Date Noted  . Nausea and vomiting 03/14/2019  . Chronic diarrhea 03/14/2019  . Dysuria 03/14/2019  . UTI (urinary tract infection) 03/14/2019  . Aortic atherosclerosis ([place]) 12/30/2018  . Hypoalbuminemia due to protein-calorie malnutrition ([place]) 12/30/2018  . Sick sinus syndrome ([place])   . Symptomatic bradycardia 08/24/2018  . Gout 08/23/2018  . Anxiety 08/23/2018  . Pneumothorax, left 10/13/2017  . Multiple lung nodules on CT 10/06/2017  . Pleural effusion, left 10/06/2017  . Diabetic polyneuropathy associated with type 2 diabetes mellitus ([place]) 07/30/2017  . Onychomycosis of toenail 07/30/2017  . Idiopathic chronic venous hypertension of both lower extremities with inflammation 07/30/2017  . Chronic respiratory  failure with hypoxia ([place]) 04/29/2017  . Advance care planning   . Goals of care, counseling/discussion   . Palliative care by specialist   . Pressure ulcer 01/19/2017  . History of pressure ulcer 10/17/2016  . GAD (generalized anxiety disorder) 10/17/2016  . Osteopenia determined by x-ray 04/17/2016  . Type 2 diabetes mellitus with diabetic neuropathy ([place]) 06/08/2015  . Type 2 diabetes, controlled, with renal manifestation ([place]) 06/08/2015  . PVD (peripheral vascular disease) ([place]) 06/08/2015  . Swelling of limb-Left arm 04/10/2015  . Nausea with vomiting 01/12/2015  . Normocytic anemia 01/12/2015  . Paroxysmal atrial fibrillation ([place]) 12/22/2014  . Melena   . ESRD on dialysis ([place]) 12/19/2014  . Chronic diastolic (congestive) heart failure ([place]) 12/19/2014  . Palpitation 12/19/2014  . Anemia, chronic disease 12/13/2014  . Hemodialysis-associated hypotension 12/13/2014  . Pulmonary edema 11/10/2014  . Physical deconditioning 10/29/2014  . Gastritis and gastroduodenitis 10/24/2014  . Heme positive stool 10/24/2014  . Morbid obesity ([place]) 10/18/2014  . Edema 10/18/2014  . Essential hypertension 10/17/2014  . DM type 2, uncontrolled, with renal complications ([place]) 03/47/4259  . Dyslipidemia 10/17/2014   Past Medical History:  Diagnosis Date  . Anemia, chronic disease   . Anxiety   . Arthritis    knees  . Atrial fibrillation ([place])   . Barrett esophagus   . Benign paroxysmal positional vertigo   . Breast cancer ([place]) 1980s   bil mastectomies, no radiation or chemo  . Cataract   . Chest pain, atypical 12/05/2008   R/Lmv- normal perfusion all regions, noe ECG changes   . CHF (congestive heart failure) ([place]) 05/13/2011   echo - DG>38%; stage 1 diastolic dysfunction; elevated LV filling pressure, MAC  . CKD (chronic kidney disease), stage III   . Claudication ([place]) 10/30/2005   doppler - normal evaluation, no evidence of aneurysm, diameter reduction, dissection, compression  or vascular abnormality)  . Complication of anesthesia   . Controlled type 2 diabetes mellitus with diabetic polyneuropathy, with long-term current use of insulin ([place]) 04/17/2016  . Dyslipidemia   . Dysrhythmia    Atrial Fibrillation  . Edema   . ESRD (end stage renal disease) on dialysis ([place])    "[facility]; [address]; MWF" (07/06/2018)  . Family history of adverse reaction to anesthesia    son also has nausea   . Fever blister 12/2016  . Gastritis and gastroduodenitis   . GERD (gastroesophageal reflux disease)   . Gout   . Hemodialysis-associated hypotension   . HLD (hyperlipidemia)   . Hypertension   . IBS (irritable bowel syndrome)   . Insomnia   . Iron deficiency anemia   . Junctional bradycardia 08/25/2018  . LFT elevation   . Long term current use of amiodarone 10/17/2016  . Morbid (severe) obesity  due to excess calories ([place]) 04/29/2017  . Morbid obesity ([place])   . On home oxygen therapy    "2L; 24/7" (07/06/2018)  . PAF (paroxysmal atrial fibrillation) ([place])   . Peripheral neuropathy   . Pneumonia 12/2016  . PONV (postoperative nausea and vomiting)   . Renal cyst   . Renal insufficiency 10/30/2005   doppler - abn resistance consistent w/ parenchymal disease  . Renovascular hypertension   . Type 2 diabetes mellitus with diabetic neuropathy, with long-term current use of insulin ([place])   . Vertigo     Family History  Problem Relation Age of Onset  . Diabetes Mother   . Heart disease Mother   . Non-Hodgkin's lymphoma Mother   . Heart attack Father   . Stroke Father   . Parkinson's disease Brother   . Lung disease Brother   . Heart disease Brother   . Multiple myeloma Sister     Past Surgical History:  Procedure Laterality Date  . A/V FISTULAGRAM Left 03/19/2018   Procedure: A/V FISTULAGRAM;  Surgeon: Angelia Mould, MD;  Location: [place] [facility];  Service: Cardiovascular;  Laterality: Left;  . A/V FISTULAGRAM N/A 12/24/2018   Procedure: A/V FISTULAGRAM -  Right Arm;  Surgeon: Angelia Mould, MD;  Location: [place] [facility];  Service: Cardiovascular;  Laterality: N/A;  . A/V FISTULAGRAM Right 03/29/2019   Procedure: A/V FISTULAGRAM;  Surgeon: Angelia Mould, MD;  Location: [place] [facility];  Service: Cardiovascular;  Laterality: Right;  . A/V FISTULAGRAM Right 02/03/2020   Procedure: A/V FISTULAGRAM;  Surgeon: Angelia Mould, MD;  Location: [place] [facility];  Service: Cardiovascular;  Laterality: Right;  . A/V SHUNTOGRAM Left 01/19/2018   Procedure: A/V SHUNTOGRAM;  Surgeon: Serafina Mitchell, MD;  Location: [place] [facility];  Service: Cardiovascular;  Laterality: Left;  . ANGIOPLASTY Left 02/12/2015   Procedure: ANGIOPLASTY;  Surgeon: Angelia Mould, MD;  Location: [facility];  Service: Cardiovascular;  Laterality: Left;  AVF  . AV FISTULA PLACEMENT Left 11/30/2014   Procedure: ARTERIOVENOUS (AV) FISTULA CREATION LEFT ARM;  Surgeon: Angelia Mould, MD;  Location: [place];  Service: Vascular;  Laterality: Left;  . AV FISTULA PLACEMENT Left 03/20/2015   Procedure:  Inserton of Left Upper Arm Gortex Graft;  Surgeon: Angelia Mould, MD;  Location: [place];  Service: Vascular;  Laterality: Left;  . AV FISTULA PLACEMENT Left 04/12/2015   Procedure: INSERTION OF LEFT ARM  ARTERIOVENOUS GORE-TEX GRAFT ;  Surgeon: Angelia Mould, MD;  Location: [place];  Service: Vascular;  Laterality: Left;  . AV FISTULA PLACEMENT Right 06/08/2018   Procedure: INSERTION OF ARTERIOVENOUS (AV) GORE-TEX GRAFT ARM USING 4-7MM X 45CM GORETEX GRAFT;  Surgeon: Angelia Mould, MD;  Location: [place];  Service: Vascular;  Laterality: Right;  . BACK SURGERY  ~1980  . CATARACT EXTRACTION Right   . CHOLECYSTECTOMY    . ESOPHAGOGASTRODUODENOSCOPY N/A 10/24/2014   Procedure: ESOPHAGOGASTRODUODENOSCOPY (EGD);  Surgeon: Jerene Bears, MD;  Location[facility] ENDOSCOPY;  Service: Endoscopy;  Laterality: N/A;  . EXCHANGE OF A DIALYSIS CATHETER  Left 11/30/2014   Procedure: EXCHANGE OF A DIALYSIS CATHETER, LEFT INTERNAL JUGULAR;  Surgeon: Angelia Mould, MD;  Location: [place];  Service: Vascular;  Laterality: Left;  . EYE SURGERY Left    transplant  . FISTULOGRAM N/A 02/12/2015   Procedure: FISTULOGRAM;  Surgeon: Angelia Mould, MD;  Location: [facility] CATH LAB;  Service: Cardiovascular;  Laterality: N/A;  .  HEMORRHOID SURGERY    . IR AV DIALY SHUNT INTRO NEEDLE/INTRACATH INITIAL W/PTA/IMG RIGHT Right 08/25/2018  . IR AV DIALY SHUNT INTRO NEEDLE/INTRACATH INITIAL W/PTA/IMG RIGHT Right 11/17/2018  . IR US GUIDE VASC ACCESS RIGHT  08/25/2018  . LIGATION ARTERIOVENOUS GORTEX GRAFT Left 08/27/2018   Procedure: LIGATION ARTERIOVENOUS GORTEX GRAFT ARM;  Surgeon: Angelia Mould, MD;  Location: [place];  Service: Vascular;  Laterality: Left;  . LIGATION OF ARTERIOVENOUS  FISTULA Left 03/20/2015   Procedure: LIGATION OF ARTERIOVENOUS  FISTULA;  Surgeon: Angelia Mould, MD;  Location: [place];  Service: Vascular;  Laterality: Left;  Marland Kitchen MASTECTOMY Bilateral ~1982   bil breast reconstruction with implants  . ORIF TIBIA & FIBULA FRACTURES Left 2007   also had left non displaced malleolar fracture.   Marland Kitchen PERIPHERAL VASCULAR BALLOON ANGIOPLASTY Left 01/19/2018   Procedure: PERIPHERAL VASCULAR BALLOON ANGIOPLASTY;  Surgeon: Serafina Mitchell, MD;  Location: [place] [facility];  Service: Cardiovascular;  Laterality: Left;  fistula  . PERIPHERAL VASCULAR BALLOON ANGIOPLASTY Left 03/19/2018   Procedure: PERIPHERAL VASCULAR BALLOON ANGIOPLASTY;  Surgeon: Angelia Mould, MD;  Location: [place] [facility];  Service: Cardiovascular;  Laterality: Left;  UPPER ARM FISTULA  . PERIPHERAL VASCULAR BALLOON ANGIOPLASTY Right 12/24/2018   Procedure: PERIPHERAL VASCULAR BALLOON ANGIOPLASTY;  Surgeon: Angelia Mould, MD;  Location: [place] [facility];  Service: Cardiovascular;  Laterality: Right;  arm fistula  . PERIPHERAL VASCULAR BALLOON  ANGIOPLASTY  03/29/2019   Procedure: PERIPHERAL VASCULAR BALLOON ANGIOPLASTY;  Surgeon: Angelia Mould, MD;  Location: [place] [facility];  Service: Cardiovascular;;  . PERIPHERAL VASCULAR BALLOON ANGIOPLASTY Right 02/03/2020   Procedure: PERIPHERAL VASCULAR BALLOON ANGIOPLASTY;  Surgeon: Angelia Mould, MD;  Location: [place] [facility];  Service: Cardiovascular;  Laterality: Right;  upper arm fistula  . REMOVAL OF GRAFT Left 04/12/2015   Procedure: REMOVAL OF LEFT ARM ARTERIOVENOUS GORE-TEX GRAFT;  Surgeon: Angelia Mould, MD;  Location: [place];  Service: Vascular;  Laterality: Left;  . REVISION OF ARTERIOVENOUS GORETEX GRAFT Right 12/13/2019   Procedure: REVISION OF ARTERIOVENOUS GORETEX GRAFT RIGHT FOREARM;  Surgeon: Angelia Mould, MD;  Location: [place];  Service: Vascular;  Laterality: Right;   Social History   Occupational History  . Not on file  Tobacco Use  . Smoking status: Never Smoker  . Smokeless tobacco: Never Used  Substance and Sexual Activity  . Alcohol use: No  . Drug use: No  . Sexual activity: Not on file

## 2020-06-05 ENCOUNTER — Other Ambulatory Visit: Payer: Self-pay | Admitting: *Deleted

## 2020-06-05 DIAGNOSIS — Z992 Dependence on renal dialysis: Secondary | ICD-10-CM

## 2020-06-13 NOTE — H&P (View-Only) (Signed)
HISTORY AND PHYSICAL     CC:  dialysis access Requesting Provider:  [facility], [place]  HPI: This is a 84 y.o. female here for evaluation of her hemodialysis access.  She was seen by Dr. Scot Dock in February 2021 and subsequently underwent fistulogram with venoplasty of 2 stenoses in the outflow brachial vein with 70% stenosis above the antecubital level which was addressed with a 7 mm x 4 cm balloon with no residual stenosis and 99% stenosis in the upper arm brachial vein just below the axilla which was addressed with an 8 mm x 4 cm balloon with no residual stenosis.   She did not have a central venous stenosis and there were not problems with the body of the graft or the arterial anastomosis.    Dr. Sharol Given is following pt for pain in left ankle and joints of the left foot.   She comes in today for evaluation of her access.  She states that the machine has not been running well on dialysis.  She states her right hand stays cold but is not painful.  She resides in SNF.  Dialysis access history: -left RC AVF and TDC (left) 11/30/2014 - CSD -venoplasty left cephalic vein 2/75/1700 - CSD -ligation of left RC AVF 03/20/2015 - CSD -new left FA AVG 03/20/2015 - CSD -removal FA AVG 04/12/2015 - CSD -new LUA AVG 04/12/2015 - CSD -angioplasty left innominate vein and left subclavian vein 01/19/2018 - WB -venoplasty of left subclavian vein and left brachial vein with drug coated balloon angioplasty of left subclavian vein 03/19/2018 - CSD -new right FA AVG  06/08/2018 - CSD -ligation LUA AVG 08/27/2018 - CSD for venous HTN -venoplasty of outflow brachial vein 12/24/2018 - CSD -venoplasty right brachial vein 03/29/2019 - CSD -revision of right FA AVG with replacement of venous half of graft 12/13/2019 - CSD -venoplasty of outflow brachial vein for 2 stenoses 02/03/2020 - CSD     Pt is on dialysis.   Days of dialysis if applicable:  M/W/F    HD center if applicable:  [facility] location.   The pt is not on a  statin for cholesterol management.  The pt is on a daily aspirin.  Other AC:  none The pt is on BB for hypertension.  The pt is not diabetic.   Tobacco hx:  never  Past Medical History:  Diagnosis Date  . Anemia, chronic disease   . Anxiety   . Arthritis    knees  . Atrial fibrillation ([place])   . Barrett esophagus   . Benign paroxysmal positional vertigo   . Breast cancer ([place]) 1980s   bil mastectomies, no radiation or chemo  . Cataract   . Chest pain, atypical 12/05/2008   R/Lmv- normal perfusion all regions, noe ECG changes   . CHF (congestive heart failure) ([place]) 05/13/2011   echo - FV>49%; stage 1 diastolic dysfunction; elevated LV filling pressure, MAC  . CKD (chronic kidney disease), stage III   . Claudication ([place]) 10/30/2005   doppler - normal evaluation, no evidence of aneurysm, diameter reduction, dissection, compression or vascular abnormality)  . Complication of anesthesia   . Controlled type 2 diabetes mellitus with diabetic polyneuropathy, with long-term current use of insulin ([place]) 04/17/2016  . Dyslipidemia   . Dysrhythmia    Atrial Fibrillation  . Edema   . ESRD (end stage renal disease) on dialysis ([place])    "[facility]; [address]; MWF" (07/06/2018)  . Family history of adverse reaction to anesthesia  son also has nausea   . Fever blister 12/2016  . Gastritis and gastroduodenitis   . GERD (gastroesophageal reflux disease)   . Gout   . Hemodialysis-associated hypotension   . HLD (hyperlipidemia)   . Hypertension   . IBS (irritable bowel syndrome)   . Insomnia   . Iron deficiency anemia   . Junctional bradycardia 08/25/2018  . LFT elevation   . Long term current use of amiodarone 10/17/2016  . Morbid (severe) obesity due to excess calories ([place]) 04/29/2017  . Morbid obesity ([place])   . On home oxygen therapy    "2L; 24/7" (07/06/2018)  . PAF (paroxysmal atrial fibrillation) ([place])   . Peripheral neuropathy   . Pneumonia 12/2016  . PONV (postoperative nausea  and vomiting)   . Renal cyst   . Renal insufficiency 10/30/2005   doppler - abn resistance consistent w/ parenchymal disease  . Renovascular hypertension   . Type 2 diabetes mellitus with diabetic neuropathy, with long-term current use of insulin ([place])   . Vertigo     Past Surgical History:  Procedure Laterality Date  . A/V FISTULAGRAM Left 03/19/2018   Procedure: A/V FISTULAGRAM;  Surgeon: Angelia Mould, MD;  Location: [place] [facility];  Service: Cardiovascular;  Laterality: Left;  . A/V FISTULAGRAM N/A 12/24/2018   Procedure: A/V FISTULAGRAM - Right Arm;  Surgeon: Angelia Mould, MD;  Location: [place] [facility];  Service: Cardiovascular;  Laterality: N/A;  . A/V FISTULAGRAM Right 03/29/2019   Procedure: A/V FISTULAGRAM;  Surgeon: Angelia Mould, MD;  Location: [place] [facility];  Service: Cardiovascular;  Laterality: Right;  . A/V FISTULAGRAM Right 02/03/2020   Procedure: A/V FISTULAGRAM;  Surgeon: Angelia Mould, MD;  Location: [place] [facility];  Service: Cardiovascular;  Laterality: Right;  . A/V SHUNTOGRAM Left 01/19/2018   Procedure: A/V SHUNTOGRAM;  Surgeon: Serafina Mitchell, MD;  Location: [place] [facility];  Service: Cardiovascular;  Laterality: Left;  . ANGIOPLASTY Left 02/12/2015   Procedure: ANGIOPLASTY;  Surgeon: Angelia Mould, MD;  Location: [facility] CATH LAB;  Service: Cardiovascular;  Laterality: Left;  AVF  . AV FISTULA PLACEMENT Left 11/30/2014   Procedure: ARTERIOVENOUS (AV) FISTULA CREATION LEFT ARM;  Surgeon: Angelia Mould, MD;  Location: [place];  Service: Vascular;  Laterality: Left;  . AV FISTULA PLACEMENT Left 03/20/2015   Procedure:  Inserton of Left Upper Arm Gortex Graft;  Surgeon: Angelia Mould, MD;  Location: [place];  Service: Vascular;  Laterality: Left;  . AV FISTULA PLACEMENT Left 04/12/2015   Procedure: INSERTION OF LEFT ARM  ARTERIOVENOUS GORE-TEX GRAFT ;  Surgeon: Angelia Mould, MD;  Location: [place];   Service: Vascular;  Laterality: Left;  . AV FISTULA PLACEMENT Right 06/08/2018   Procedure: INSERTION OF ARTERIOVENOUS (AV) GORE-TEX GRAFT ARM USING 4-7MM X 45CM GORETEX GRAFT;  Surgeon: Angelia Mould, MD;  Location: [place];  Service: Vascular;  Laterality: Right;  . BACK SURGERY  ~1980  . CATARACT EXTRACTION Right   . CHOLECYSTECTOMY    . ESOPHAGOGASTRODUODENOSCOPY N/A 10/24/2014   Procedure: ESOPHAGOGASTRODUODENOSCOPY (EGD);  Surgeon: Jerene Bears, MD;  Location[facility] ENDOSCOPY;  Service: Endoscopy;  Laterality: N/A;  . EXCHANGE OF A DIALYSIS CATHETER Left 11/30/2014   Procedure: EXCHANGE OF A DIALYSIS CATHETER, LEFT INTERNAL JUGULAR;  Surgeon: Angelia Mould, MD;  Location: [place];  Service: Vascular;  Laterality: Left;  . EYE SURGERY Left    transplant  . FISTULOGRAM N/A 02/12/2015  Procedure: FISTULOGRAM;  Surgeon: Angelia Mould, MD;  Location: [facility] CATH LAB;  Service: Cardiovascular;  Laterality: N/A;  . HEMORRHOID SURGERY    . IR AV DIALY SHUNT INTRO NEEDLE/INTRACATH INITIAL W/PTA/IMG RIGHT Right 08/25/2018  . IR AV DIALY SHUNT INTRO NEEDLE/INTRACATH INITIAL W/PTA/IMG RIGHT Right 11/17/2018  . IR US GUIDE VASC ACCESS RIGHT  08/25/2018  . LIGATION ARTERIOVENOUS GORTEX GRAFT Left 08/27/2018   Procedure: LIGATION ARTERIOVENOUS GORTEX GRAFT ARM;  Surgeon: Angelia Mould, MD;  Location: [place];  Service: Vascular;  Laterality: Left;  . LIGATION OF ARTERIOVENOUS  FISTULA Left 03/20/2015   Procedure: LIGATION OF ARTERIOVENOUS  FISTULA;  Surgeon: Angelia Mould, MD;  Location: [place];  Service: Vascular;  Laterality: Left;  Marland Kitchen MASTECTOMY Bilateral ~1982   bil breast reconstruction with implants  . ORIF TIBIA & FIBULA FRACTURES Left 2007   also had left non displaced malleolar fracture.   Marland Kitchen PERIPHERAL VASCULAR BALLOON ANGIOPLASTY Left 01/19/2018   Procedure: PERIPHERAL VASCULAR BALLOON ANGIOPLASTY;  Surgeon: Serafina Mitchell, MD;  Location: [place] [facility];  Service:  Cardiovascular;  Laterality: Left;  fistula  . PERIPHERAL VASCULAR BALLOON ANGIOPLASTY Left 03/19/2018   Procedure: PERIPHERAL VASCULAR BALLOON ANGIOPLASTY;  Surgeon: Angelia Mould, MD;  Location: [place] [facility];  Service: Cardiovascular;  Laterality: Left;  UPPER ARM FISTULA  . PERIPHERAL VASCULAR BALLOON ANGIOPLASTY Right 12/24/2018   Procedure: PERIPHERAL VASCULAR BALLOON ANGIOPLASTY;  Surgeon: Angelia Mould, MD;  Location: [place] [facility];  Service: Cardiovascular;  Laterality: Right;  arm fistula  . PERIPHERAL VASCULAR BALLOON ANGIOPLASTY  03/29/2019   Procedure: PERIPHERAL VASCULAR BALLOON ANGIOPLASTY;  Surgeon: Angelia Mould, MD;  Location: [place] [facility];  Service: Cardiovascular;;  . PERIPHERAL VASCULAR BALLOON ANGIOPLASTY Right 02/03/2020   Procedure: PERIPHERAL VASCULAR BALLOON ANGIOPLASTY;  Surgeon: Angelia Mould, MD;  Location: [place] [facility];  Service: Cardiovascular;  Laterality: Right;  upper arm fistula  . REMOVAL OF GRAFT Left 04/12/2015   Procedure: REMOVAL OF LEFT ARM ARTERIOVENOUS GORE-TEX GRAFT;  Surgeon: Angelia Mould, MD;  Location: [place];  Service: Vascular;  Laterality: Left;  . REVISION OF ARTERIOVENOUS GORETEX GRAFT Right 12/13/2019   Procedure: REVISION OF ARTERIOVENOUS GORETEX GRAFT RIGHT FOREARM;  Surgeon: Angelia Mould, MD;  Location: [place];  Service: Vascular;  Laterality: Right;    Allergies  Allergen Reactions  . Erythromycin Swelling  . Codeine Palpitations, Rash and Other (See Comments)    GI Upset  . Erythromycin Base Other (See Comments)    GI Upset  . Penicillins Rash and Other (See Comments)    Has taken Keflex & Rocephin many times without problems Documented on MAR  . Zantac [Ranitidine Hcl] Nausea And Vomiting    Current Outpatient Medications  Medication Sig Dispense Refill  . acetaminophen (TYLENOL) 325 MG tablet Take 650 mg by mouth every 6 (six) hours as needed for fever.     Marland Kitchen  allopurinol (ZYLOPRIM) 100 MG tablet Take 100 mg by mouth daily. (0900)    . aspirin 81 MG chewable tablet Chew 81 mg by mouth daily. (0900)    . B Complex-C-Folic Acid (NEPHRO VITAMINS) 0.8 MG TABS Take 1 tablet by mouth daily. (0900)    . Brinzolamide-Brimonidine (SIMBRINZA) 1-0.2 % SUSP Place 1 drop into both eyes 2 (two) times daily. (0900 & 2100)    . calcium carbonate (TUMS - DOSED IN MG ELEMENTAL CALCIUM) 500 MG chewable tablet Chew 1 tablet by mouth 2 (two) times  daily.    . cetirizine (ZYRTEC) 10 MG tablet Take 10 mg by mouth daily.    . cholestyramine (QUESTRAN) 4 g packet Take 1 packet (4 g total) by mouth 2 (two) times daily. Take twice daily before breakfast and dinner (Patient taking differently: Take 4 g by mouth 2 (two) times daily. Take twice daily before breakfast and lunch) 60 each 0  . diazepam (VALIUM) 5 MG tablet Take 1 tablet (5 mg total) by mouth at bedtime. 5 tablet 0  . diclofenac sodium (VOLTAREN) 1 % GEL Apply 2 g topically at bedtime. Applied to right knee    . DIMETHICONE, TOPICAL, 5 % CREA Apply 1 application topically See admin instructions. Place a nickel-sized amount onto buttock(s) area 2 times a day for barrier protection    . diphenoxylate-atropine (LOMOTIL) 2.5-0.025 MG tablet Take 1 tablet by mouth 4 (four) times daily.    . fluticasone (FLONASE) 50 MCG/ACT nasal spray Place 2 sprays into both nostrils daily.    Marland Kitchen loperamide (IMODIUM A-D) 2 MG tablet Take 2 tablets (4 mg total) by mouth every 8 (eight) hours as needed for diarrhea or loose stools (for diarrhea). 50 tablet 2  . Melatonin 5 MG CAPS Take 10 mg by mouth at bedtime.    . metoprolol tartrate (LOPRESSOR) 25 MG tablet Take 25 mg by mouth daily. Hold if hr <60 or SBP>105    . midodrine (PROAMATINE) 2.5 MG tablet Take 2.5 mg by mouth 2 (two) times daily. Give if bp is < 120/60    . OXYGEN Inhale 2 L into the lungs continuous.     Marland Kitchen SALINE NASAL MIST NA Place 2 sprays into the nose 3 (three) times daily.     . traMADol (ULTRAM) 50 MG tablet Take 0.5 tablets (25 mg total) by mouth every 8 (eight) hours as needed. 8 tablet 0  . traZODone (DESYREL) 50 MG tablet Take 50 mg by mouth at bedtime.      No current facility-administered medications for this visit.    Family History  Problem Relation Age of Onset  . Diabetes Mother   . Heart disease Mother   . Non-Hodgkin's lymphoma Mother   . Heart attack Father   . Stroke Father   . Parkinson's disease Brother   . Lung disease Brother   . Heart disease Brother   . Multiple myeloma Sister     Social History   Socioeconomic History  . Marital status: Divorced    Spouse name: Not on file  . Number of children: Not on file  . Years of education: Not on file  . Highest education level: Not on file  Occupational History  . Not on file  Tobacco Use  . Smoking status: Never Smoker  . Smokeless tobacco: Never Used  Vaping Use  . Vaping Use: Never used  Substance and Sexual Activity  . Alcohol use: No  . Drug use: No  . Sexual activity: Not on file  Other Topics Concern  . Not on file  Social History Narrative   [facility] (10/06/17):   Originally from [facility] [place]. Has always lived in [place]. She currently lives in [place]. No bird exposure. [place] Previously traveled to [place], [place], & [place]. Previously worked in a nursery and also a Pharmacist, community.    Social Determinants of Health   Financial Resource Strain:   . Difficulty of Paying Living Expenses:   Food Insecurity:   . Worried About Estate manager/land agent  of Food in the Last Year:   . [place] in the Last Year:   Transportation Needs:   . Lack of Transportation (Medical):   Marland Kitchen Lack of Transportation (Non-Medical):   Physical Activity:   . Days of Exercise per Week:   . Minutes of Exercise per Session:   Stress:   . Feeling of Stress :   Social Connections:   . Frequency of Communication with Friends and Family:   . Frequency of Social Gatherings with Friends and  Family:   . Attends Religious Services:   . Active Member of Clubs or Organizations:   . Attends Archivist Meetings:   Marland Kitchen Marital Status:   Intimate Partner Violence:   . Fear of Current or Ex-Partner:   . Emotionally Abused:   Marland Kitchen Physically Abused:   . Sexually Abused:      ROS: _0  Positive   _1  Negative   _2  All sytems reviewed and are negative  Cardiac: _3  states she has afib not on [facility]  Vascular: _4  pain in legs while walking _5  pain in feet when lying flat _6  hx of DVT _7  swelling in legs  Pulmonary: _8  asthma _9  wheezing  Neurologic: _10  weakness in _11  arms _12  legs _13  numbness in _14  arms _15  legs _16 difficulty speaking or slurred speech _17  temporary loss of vision in one eye _18  dizziness  Hematologic: _19  bleeding problems  GI _20  GERD  GU: _21  CKD/renal failure  _22  HD---_23  M/W/F _24  T/T/S  Psychiatric: _25  hx of major depression  Integumentary: _26  rashes _27  ulcers  Constitutional: _28  fever _29  chills  PHYSICAL EXAMINATION:  Today's Vitals   06/14/20 1005  BP: 120/84  Pulse: (!) 134  Resp: 20  Temp: (!) 97.1 F (36.2 C)  TempSrc: Temporal  SpO2: 100%  Weight: 124 lb 5.4 oz (56.4 kg)  Height: _30  (1.651 m)   Body mass index is 20.69 kg/m.    General:  WDWN female in NAD Gait: Not observed HENT: WNL Pulmonary: normal non-labored breathing , without Rales, rhonchi,  wheezing Cardiac: irregular and not tachycardic, without  Murmur without carotid bruits Skin: without rashes, without ulcers  Vascular Exam/Pulses:   Right Left  Radial Unable to palpate  Unable to palpate   Ulnar Unable to palpate  2+ (normal)   Extremities:  Right FA loop AVG with +thrill and easily palpable; bilateral hand are cool to touch.  Strong hand grip right hand.  Musculoskeletal: no muscle wasting or atrophy  Neurologic: A&O X 3; Speech is fluent/normal  Non-Invasive Vascular Imaging:   Upper Extremity Vein Mapping on 6/94/8546:  Right  Cephalic  Diameter (cm)Depth (cm)  Findings    +-----------------+-------------+----------+----------------+  Shoulder       0.09                  +-----------------+-------------+----------+----------------+  Prox upper arm              non compressible  +-----------------+-------------+----------+----------------+  Mid upper arm               not visualized   +-----------------+-------------+----------+----------------+  Dist upper arm               not visualized   +-----------------+-------------+----------+----------------+  Antecubital fossa  0.16                  +-----------------+-------------+----------+----------------+  Prox forearm     0.15                  +-----------------+-------------+----------+----------------+  Mid forearm     0.07                  +-----------------+-------------+----------+----------------+  Dist forearm     0.13                  +-----------------+-------------+----------+----------------+   +-----------------+-------------+----------+--------+  Right Basilic  Diameter (cm)Depth (cm)Findings  +-----------------+-------------+----------+--------+  Prox upper arm    0.34              +-----------------+-------------+----------+--------+  Mid upper arm    0.32              +-----------------+-------------+----------+--------+  Dist upper arm    0.30              +-----------------+-------------+----------+--------+  Antecubital fossa  0.24              +-----------------+-------------+----------+--------+  Prox forearm     0.26              +-----------------+-------------+----------+--------+   +-----------------+-------------+----------+--------+  Left Cephalic   Diameter (cm)Depth (cm)Findings  +-----------------+-------------+----------+--------+  Shoulder       0.09              +-----------------+-------------+----------+--------+  Prox upper arm    0.11              +-----------------+-------------+----------+--------+  Mid upper arm    0.12              +-----------------+-------------+----------+--------+  Dist upper arm    0.25              +-----------------+-------------+----------+--------+  Antecubital fossa  0.39              +-----------------+-------------+----------+--------+  Prox forearm     0.13              +-----------------+-------------+----------+--------+  Mid forearm     0.13              +-----------------+-------------+----------+--------+  Dist forearm     0.10              +-----------------+-------------+----------+--------+   +-----------------+-------------+----------+--------+  Left Basilic   Diameter (cm)Depth (cm)Findings  +-----------------+-------------+----------+--------+  Prox upper arm    0.21              +-----------------+-------------+----------+--------+  Mid upper arm    0.17              +-----------------+-------------+----------+--------+  Dist upper arm    0.15              +-----------------+-------------+----------+--------+  Antecubital fossa  0.15              +-----------------+-------------+----------+--------+  Prox forearm     0.11              +-----------------+-------------+----------+--------+   Summary:  Right: Right cephalic is non compressible and not visualized in the upper arm. Right basilic vein is identified with diameters as described above. The forearm segment of the cephalic vein is adjacent to the [place].    Left: Left cephalic and basilic veins identified with diameters as    described above.    Arterial duplex 06/14/2020: Right Pre-Dialysis Findings:  +-----------------------+----------+--------------------+---------+--------  +  Location        PSV (cm/s)Intralum. Diam. (cm)Waveform  Comments  +-----------------------+----------+--------------------+---------+--------  +  Brachial Antecub. [id number]    0.49        triphasic        +-----------------------+----------+--------------------+---------+--------  +  Radial Art at Wrist  27  0.18        biphasic         +-----------------------+----------+--------------------+---------+--------  +  Ulnar Art at Wrist   32    0.16        biphasic         +-----------------------+----------+--------------------+---------+--------  +   Left Pre-Dialysis Findings:  +-----------------------+----------+--------------------+--------+--------+   Location        PSV (cm/s)Intralum. Diam.  (cm)WaveformComments  +-----------------------+----------+--------------------+--------+--------+   Brachial Antecub. fossa76    0.40        biphasic        +-----------------------+----------+--------------------+--------+--------+   Radial Art at Wrist  46    0.17        biphasic        +-----------------------+----------+--------------------+--------+--------+   Ulnar Art at Wrist   65    0.18        biphasic        +-----------------------+----------+--------------------+--------+--------+   Summary:    Right: Stenosis noted in the [place] outflow vein (upper arm brachial     vein).  Left: No obstruction visualized in the left upper extremity.   ASSESSMENT/PLAN: 84 y.o. female with ESRD M/W/F here for evaluation of her hemodialysis access  -pt has stenosis in the AVG  outflow vein.  Discussed with Dr. Scot Dock and will schedule for a fistulogram by Dr. Scot Dock (pt wants Dr. Scot Dock to do her procedure).  He does not have availability on 6/25 so will have scheduler work with pt for date and her dialysis day will need to be changed given Dr. Scot Dock does fistulograms on Fridays.  -she did have vein mapping today and if it looks as if her right basilic vein may be an option in the future should she need new access.  I discussed with her that this is done in a couple of stages. -pt is on dialysis M/W/F at the [facility] location -pt is not on anticoagulation   Leontine Locket, [facility] [phone]  Clinic MD:   Oneida Alar

## 2020-06-13 NOTE — Progress Notes (Signed)
HISTORY AND PHYSICAL     CC:  dialysis access Requesting Provider:  [facility], [place]  HPI: This is a 84 y.o. female here for evaluation of her hemodialysis access.  She was seen by Dr. Scot Dock in February 2021 and subsequently underwent fistulogram with venoplasty of 2 stenoses in the outflow brachial vein with 70% stenosis above the antecubital level which was addressed with a 7 mm x 4 cm balloon with no residual stenosis and 99% stenosis in the upper arm brachial vein just below the axilla which was addressed with an 8 mm x 4 cm balloon with no residual stenosis.   She did not have a central venous stenosis and there were not problems with the body of the graft or the arterial anastomosis.    Dr. Sharol Given is following pt for pain in left ankle and joints of the left foot.   She comes in today for evaluation of her access.  She states that the machine has not been running well on dialysis.  She states her right hand stays cold but is not painful.  She resides in SNF.  Dialysis access history: -left RC AVF and TDC (left) 11/30/2014 - CSD -venoplasty left cephalic vein 2/75/1700 - CSD -ligation of left RC AVF 03/20/2015 - CSD -new left FA AVG 03/20/2015 - CSD -removal FA AVG 04/12/2015 - CSD -new LUA AVG 04/12/2015 - CSD -angioplasty left innominate vein and left subclavian vein 01/19/2018 - WB -venoplasty of left subclavian vein and left brachial vein with drug coated balloon angioplasty of left subclavian vein 03/19/2018 - CSD -new right FA AVG  06/08/2018 - CSD -ligation LUA AVG 08/27/2018 - CSD for venous HTN -venoplasty of outflow brachial vein 12/24/2018 - CSD -venoplasty right brachial vein 03/29/2019 - CSD -revision of right FA AVG with replacement of venous half of graft 12/13/2019 - CSD -venoplasty of outflow brachial vein for 2 stenoses 02/03/2020 - CSD     Pt is on dialysis.   Days of dialysis if applicable:  M/W/F    HD center if applicable:  [facility] location.   The pt is not on a  statin for cholesterol management.  The pt is on a daily aspirin.  Other AC:  none The pt is on BB for hypertension.  The pt is not diabetic.   Tobacco hx:  never  Past Medical History:  Diagnosis Date  . Anemia, chronic disease   . Anxiety   . Arthritis    knees  . Atrial fibrillation ([place])   . Barrett esophagus   . Benign paroxysmal positional vertigo   . Breast cancer ([place]) 1980s   bil mastectomies, no radiation or chemo  . Cataract   . Chest pain, atypical 12/05/2008   R/Lmv- normal perfusion all regions, noe ECG changes   . CHF (congestive heart failure) ([place]) 05/13/2011   echo - FV>49%; stage 1 diastolic dysfunction; elevated LV filling pressure, MAC  . CKD (chronic kidney disease), stage III   . Claudication ([place]) 10/30/2005   doppler - normal evaluation, no evidence of aneurysm, diameter reduction, dissection, compression or vascular abnormality)  . Complication of anesthesia   . Controlled type 2 diabetes mellitus with diabetic polyneuropathy, with long-term current use of insulin ([place]) 04/17/2016  . Dyslipidemia   . Dysrhythmia    Atrial Fibrillation  . Edema   . ESRD (end stage renal disease) on dialysis ([place])    "[facility]; [address]; MWF" (07/06/2018)  . Family history of adverse reaction to anesthesia  son also has nausea   . Fever blister 12/2016  . Gastritis and gastroduodenitis   . GERD (gastroesophageal reflux disease)   . Gout   . Hemodialysis-associated hypotension   . HLD (hyperlipidemia)   . Hypertension   . IBS (irritable bowel syndrome)   . Insomnia   . Iron deficiency anemia   . Junctional bradycardia 08/25/2018  . LFT elevation   . Long term current use of amiodarone 10/17/2016  . Morbid (severe) obesity due to excess calories ([place]) 04/29/2017  . Morbid obesity ([place])   . On home oxygen therapy    "2L; 24/7" (07/06/2018)  . PAF (paroxysmal atrial fibrillation) ([place])   . Peripheral neuropathy   . Pneumonia 12/2016  . PONV (postoperative nausea  and vomiting)   . Renal cyst   . Renal insufficiency 10/30/2005   doppler - abn resistance consistent w/ parenchymal disease  . Renovascular hypertension   . Type 2 diabetes mellitus with diabetic neuropathy, with long-term current use of insulin ([place])   . Vertigo     Past Surgical History:  Procedure Laterality Date  . A/V FISTULAGRAM Left 03/19/2018   Procedure: A/V FISTULAGRAM;  Surgeon: Angelia Mould, MD;  Location: [place] [facility];  Service: Cardiovascular;  Laterality: Left;  . A/V FISTULAGRAM N/A 12/24/2018   Procedure: A/V FISTULAGRAM - Right Arm;  Surgeon: Angelia Mould, MD;  Location: [place] [facility];  Service: Cardiovascular;  Laterality: N/A;  . A/V FISTULAGRAM Right 03/29/2019   Procedure: A/V FISTULAGRAM;  Surgeon: Angelia Mould, MD;  Location: [place] [facility];  Service: Cardiovascular;  Laterality: Right;  . A/V FISTULAGRAM Right 02/03/2020   Procedure: A/V FISTULAGRAM;  Surgeon: Angelia Mould, MD;  Location: [place] [facility];  Service: Cardiovascular;  Laterality: Right;  . A/V SHUNTOGRAM Left 01/19/2018   Procedure: A/V SHUNTOGRAM;  Surgeon: Serafina Mitchell, MD;  Location: [place] [facility];  Service: Cardiovascular;  Laterality: Left;  . ANGIOPLASTY Left 02/12/2015   Procedure: ANGIOPLASTY;  Surgeon: Angelia Mould, MD;  Location: [facility] CATH LAB;  Service: Cardiovascular;  Laterality: Left;  AVF  . AV FISTULA PLACEMENT Left 11/30/2014   Procedure: ARTERIOVENOUS (AV) FISTULA CREATION LEFT ARM;  Surgeon: Angelia Mould, MD;  Location: [place];  Service: Vascular;  Laterality: Left;  . AV FISTULA PLACEMENT Left 03/20/2015   Procedure:  Inserton of Left Upper Arm Gortex Graft;  Surgeon: Angelia Mould, MD;  Location: [place];  Service: Vascular;  Laterality: Left;  . AV FISTULA PLACEMENT Left 04/12/2015   Procedure: INSERTION OF LEFT ARM  ARTERIOVENOUS GORE-TEX GRAFT ;  Surgeon: Angelia Mould, MD;  Location: [place];   Service: Vascular;  Laterality: Left;  . AV FISTULA PLACEMENT Right 06/08/2018   Procedure: INSERTION OF ARTERIOVENOUS (AV) GORE-TEX GRAFT ARM USING 4-7MM X 45CM GORETEX GRAFT;  Surgeon: Angelia Mould, MD;  Location: [place];  Service: Vascular;  Laterality: Right;  . BACK SURGERY  ~1980  . CATARACT EXTRACTION Right   . CHOLECYSTECTOMY    . ESOPHAGOGASTRODUODENOSCOPY N/A 10/24/2014   Procedure: ESOPHAGOGASTRODUODENOSCOPY (EGD);  Surgeon: Jerene Bears, MD;  Location[facility] ENDOSCOPY;  Service: Endoscopy;  Laterality: N/A;  . EXCHANGE OF A DIALYSIS CATHETER Left 11/30/2014   Procedure: EXCHANGE OF A DIALYSIS CATHETER, LEFT INTERNAL JUGULAR;  Surgeon: Angelia Mould, MD;  Location: [place];  Service: Vascular;  Laterality: Left;  . EYE SURGERY Left    transplant  . FISTULOGRAM N/A 02/12/2015  Procedure: FISTULOGRAM;  Surgeon: Angelia Mould, MD;  Location: [facility] CATH LAB;  Service: Cardiovascular;  Laterality: N/A;  . HEMORRHOID SURGERY    . IR AV DIALY SHUNT INTRO NEEDLE/INTRACATH INITIAL W/PTA/IMG RIGHT Right 08/25/2018  . IR AV DIALY SHUNT INTRO NEEDLE/INTRACATH INITIAL W/PTA/IMG RIGHT Right 11/17/2018  . IR US GUIDE VASC ACCESS RIGHT  08/25/2018  . LIGATION ARTERIOVENOUS GORTEX GRAFT Left 08/27/2018   Procedure: LIGATION ARTERIOVENOUS GORTEX GRAFT ARM;  Surgeon: Angelia Mould, MD;  Location: [place];  Service: Vascular;  Laterality: Left;  . LIGATION OF ARTERIOVENOUS  FISTULA Left 03/20/2015   Procedure: LIGATION OF ARTERIOVENOUS  FISTULA;  Surgeon: Angelia Mould, MD;  Location: [place];  Service: Vascular;  Laterality: Left;  Marland Kitchen MASTECTOMY Bilateral ~1982   bil breast reconstruction with implants  . ORIF TIBIA & FIBULA FRACTURES Left 2007   also had left non displaced malleolar fracture.   Marland Kitchen PERIPHERAL VASCULAR BALLOON ANGIOPLASTY Left 01/19/2018   Procedure: PERIPHERAL VASCULAR BALLOON ANGIOPLASTY;  Surgeon: Serafina Mitchell, MD;  Location: [place] [facility];  Service:  Cardiovascular;  Laterality: Left;  fistula  . PERIPHERAL VASCULAR BALLOON ANGIOPLASTY Left 03/19/2018   Procedure: PERIPHERAL VASCULAR BALLOON ANGIOPLASTY;  Surgeon: Angelia Mould, MD;  Location: [place] [facility];  Service: Cardiovascular;  Laterality: Left;  UPPER ARM FISTULA  . PERIPHERAL VASCULAR BALLOON ANGIOPLASTY Right 12/24/2018   Procedure: PERIPHERAL VASCULAR BALLOON ANGIOPLASTY;  Surgeon: Angelia Mould, MD;  Location: [place] [facility];  Service: Cardiovascular;  Laterality: Right;  arm fistula  . PERIPHERAL VASCULAR BALLOON ANGIOPLASTY  03/29/2019   Procedure: PERIPHERAL VASCULAR BALLOON ANGIOPLASTY;  Surgeon: Angelia Mould, MD;  Location: [place] [facility];  Service: Cardiovascular;;  . PERIPHERAL VASCULAR BALLOON ANGIOPLASTY Right 02/03/2020   Procedure: PERIPHERAL VASCULAR BALLOON ANGIOPLASTY;  Surgeon: Angelia Mould, MD;  Location: [place] [facility];  Service: Cardiovascular;  Laterality: Right;  upper arm fistula  . REMOVAL OF GRAFT Left 04/12/2015   Procedure: REMOVAL OF LEFT ARM ARTERIOVENOUS GORE-TEX GRAFT;  Surgeon: Angelia Mould, MD;  Location: [place];  Service: Vascular;  Laterality: Left;  . REVISION OF ARTERIOVENOUS GORETEX GRAFT Right 12/13/2019   Procedure: REVISION OF ARTERIOVENOUS GORETEX GRAFT RIGHT FOREARM;  Surgeon: Angelia Mould, MD;  Location: [place];  Service: Vascular;  Laterality: Right;    Allergies  Allergen Reactions  . Erythromycin Swelling  . Codeine Palpitations, Rash and Other (See Comments)    GI Upset  . Erythromycin Base Other (See Comments)    GI Upset  . Penicillins Rash and Other (See Comments)    Has taken Keflex & Rocephin many times without problems Documented on MAR  . Zantac [Ranitidine Hcl] Nausea And Vomiting    Current Outpatient Medications  Medication Sig Dispense Refill  . acetaminophen (TYLENOL) 325 MG tablet Take 650 mg by mouth every 6 (six) hours as needed for fever.     Marland Kitchen  allopurinol (ZYLOPRIM) 100 MG tablet Take 100 mg by mouth daily. (0900)    . aspirin 81 MG chewable tablet Chew 81 mg by mouth daily. (0900)    . B Complex-C-Folic Acid (NEPHRO VITAMINS) 0.8 MG TABS Take 1 tablet by mouth daily. (0900)    . Brinzolamide-Brimonidine (SIMBRINZA) 1-0.2 % SUSP Place 1 drop into both eyes 2 (two) times daily. (0900 & 2100)    . calcium carbonate (TUMS - DOSED IN MG ELEMENTAL CALCIUM) 500 MG chewable tablet Chew 1 tablet by mouth 2 (two) times  daily.    . cetirizine (ZYRTEC) 10 MG tablet Take 10 mg by mouth daily.    . cholestyramine (QUESTRAN) 4 g packet Take 1 packet (4 g total) by mouth 2 (two) times daily. Take twice daily before breakfast and dinner (Patient taking differently: Take 4 g by mouth 2 (two) times daily. Take twice daily before breakfast and lunch) 60 each 0  . diazepam (VALIUM) 5 MG tablet Take 1 tablet (5 mg total) by mouth at bedtime. 5 tablet 0  . diclofenac sodium (VOLTAREN) 1 % GEL Apply 2 g topically at bedtime. Applied to right knee    . DIMETHICONE, TOPICAL, 5 % CREA Apply 1 application topically See admin instructions. Place a nickel-sized amount onto buttock(s) area 2 times a day for barrier protection    . diphenoxylate-atropine (LOMOTIL) 2.5-0.025 MG tablet Take 1 tablet by mouth 4 (four) times daily.    . fluticasone (FLONASE) 50 MCG/ACT nasal spray Place 2 sprays into both nostrils daily.    Marland Kitchen loperamide (IMODIUM A-D) 2 MG tablet Take 2 tablets (4 mg total) by mouth every 8 (eight) hours as needed for diarrhea or loose stools (for diarrhea). 50 tablet 2  . Melatonin 5 MG CAPS Take 10 mg by mouth at bedtime.    . metoprolol tartrate (LOPRESSOR) 25 MG tablet Take 25 mg by mouth daily. Hold if hr <60 or SBP>105    . midodrine (PROAMATINE) 2.5 MG tablet Take 2.5 mg by mouth 2 (two) times daily. Give if bp is < 120/60    . OXYGEN Inhale 2 L into the lungs continuous.     Marland Kitchen SALINE NASAL MIST NA Place 2 sprays into the nose 3 (three) times daily.     . traMADol (ULTRAM) 50 MG tablet Take 0.5 tablets (25 mg total) by mouth every 8 (eight) hours as needed. 8 tablet 0  . traZODone (DESYREL) 50 MG tablet Take 50 mg by mouth at bedtime.      No current facility-administered medications for this visit.    Family History  Problem Relation Age of Onset  . Diabetes Mother   . Heart disease Mother   . Non-Hodgkin's lymphoma Mother   . Heart attack Father   . Stroke Father   . Parkinson's disease Brother   . Lung disease Brother   . Heart disease Brother   . Multiple myeloma Sister     Social History   Socioeconomic History  . Marital status: Divorced    Spouse name: Not on file  . Number of children: Not on file  . Years of education: Not on file  . Highest education level: Not on file  Occupational History  . Not on file  Tobacco Use  . Smoking status: Never Smoker  . Smokeless tobacco: Never Used  Vaping Use  . Vaping Use: Never used  Substance and Sexual Activity  . Alcohol use: No  . Drug use: No  . Sexual activity: Not on file  Other Topics Concern  . Not on file  Social History Narrative   [facility] (10/06/17):   Originally from [facility] [place]. Has always lived in [place]. She currently lives in [place]. No bird exposure. [place]. Previously traveled to [place], [place], & [place]. Previously worked in a nursery and also a Pharmacist, community.    Social Determinants of Health   Financial Resource Strain:   . Difficulty of Paying Living Expenses:   Food Insecurity:   . Worried About Estate manager/land agent  of Food in the Last Year:   . [place] in the Last Year:   Transportation Needs:   . Lack of Transportation (Medical):   Marland Kitchen Lack of Transportation (Non-Medical):   Physical Activity:   . Days of Exercise per Week:   . Minutes of Exercise per Session:   Stress:   . Feeling of Stress :   Social Connections:   . Frequency of Communication with Friends and Family:   . Frequency of Social Gatherings with Friends and  Family:   . Attends Religious Services:   . Active Member of Clubs or Organizations:   . Attends Archivist Meetings:   Marland Kitchen Marital Status:   Intimate Partner Violence:   . Fear of Current or Ex-Partner:   . Emotionally Abused:   Marland Kitchen Physically Abused:   . Sexually Abused:      ROS: _0  Positive   _1  Negative   _2  All sytems reviewed and are negative  Cardiac: _3  states she has afib not on [facility]  Vascular: _4  pain in legs while walking _5  pain in feet when lying flat _6  hx of DVT _7  swelling in legs  Pulmonary: _8  asthma _9  wheezing  Neurologic: _10  weakness in _11  arms _12  legs _13  numbness in _14  arms _15  legs _16 difficulty speaking or slurred speech _17  temporary loss of vision in one eye _18  dizziness  Hematologic: _19  bleeding problems  GI _20  GERD  GU: _21  CKD/renal failure  _22  HD---_23  M/W/F _24  T/T/S  Psychiatric: _25  hx of major depression  Integumentary: _26  rashes _27  ulcers  Constitutional: _28  fever _29  chills  PHYSICAL EXAMINATION:  Today's Vitals   06/14/20 1005  BP: 120/84  Pulse: (!) 134  Resp: 20  Temp: (!) 97.1 F (36.2 C)  TempSrc: Temporal  SpO2: 100%  Weight: 124 lb 5.4 oz (56.4 kg)  Height: _30  (1.651 m)   Body mass index is 20.69 kg/m.    General:  WDWN female in NAD Gait: Not observed HENT: WNL Pulmonary: normal non-labored breathing , without Rales, rhonchi,  wheezing Cardiac: irregular and not tachycardic, without  Murmur without carotid bruits Skin: without rashes, without ulcers  Vascular Exam/Pulses:   Right Left  Radial Unable to palpate  Unable to palpate   Ulnar Unable to palpate  2+ (normal)   Extremities:  Right FA loop AVG with +thrill and easily palpable; bilateral hand are cool to touch.  Strong hand grip right hand.  Musculoskeletal: no muscle wasting or atrophy  Neurologic: A&O X 3; Speech is fluent/normal  Non-Invasive Vascular Imaging:   Upper Extremity Vein Mapping on 6/94/8546:  Right  Cephalic  Diameter (cm)Depth (cm)  Findings    +-----------------+-------------+----------+----------------+  Shoulder       0.09                  +-----------------+-------------+----------+----------------+  Prox upper arm              non compressible  +-----------------+-------------+----------+----------------+  Mid upper arm               not visualized   +-----------------+-------------+----------+----------------+  Dist upper arm               not visualized   +-----------------+-------------+----------+----------------+  Antecubital fossa  0.16                  +-----------------+-------------+----------+----------------+  Prox forearm     0.15                  +-----------------+-------------+----------+----------------+  Mid forearm     0.07                  +-----------------+-------------+----------+----------------+  Dist forearm     0.13                  +-----------------+-------------+----------+----------------+   +-----------------+-------------+----------+--------+  Right Basilic  Diameter (cm)Depth (cm)Findings  +-----------------+-------------+----------+--------+  Prox upper arm    0.34              +-----------------+-------------+----------+--------+  Mid upper arm    0.32              +-----------------+-------------+----------+--------+  Dist upper arm    0.30              +-----------------+-------------+----------+--------+  Antecubital fossa  0.24              +-----------------+-------------+----------+--------+  Prox forearm     0.26              +-----------------+-------------+----------+--------+   +-----------------+-------------+----------+--------+  Left Cephalic   Diameter (cm)Depth (cm)Findings  +-----------------+-------------+----------+--------+  Shoulder       0.09              +-----------------+-------------+----------+--------+  Prox upper arm    0.11              +-----------------+-------------+----------+--------+  Mid upper arm    0.12              +-----------------+-------------+----------+--------+  Dist upper arm    0.25              +-----------------+-------------+----------+--------+  Antecubital fossa  0.39              +-----------------+-------------+----------+--------+  Prox forearm     0.13              +-----------------+-------------+----------+--------+  Mid forearm     0.13              +-----------------+-------------+----------+--------+  Dist forearm     0.10              +-----------------+-------------+----------+--------+   +-----------------+-------------+----------+--------+  Left Basilic   Diameter (cm)Depth (cm)Findings  +-----------------+-------------+----------+--------+  Prox upper arm    0.21              +-----------------+-------------+----------+--------+  Mid upper arm    0.17              +-----------------+-------------+----------+--------+  Dist upper arm    0.15              +-----------------+-------------+----------+--------+  Antecubital fossa  0.15              +-----------------+-------------+----------+--------+  Prox forearm     0.11              +-----------------+-------------+----------+--------+   Summary:  Right: Right cephalic is non compressible and not visualized in the upper arm. Right basilic vein is identified with diameters as described above. The forearm segment of the cephalic vein is adjacent to the [place].    Left: Left cephalic and basilic veins identified with diameters as    described above.    Arterial duplex 06/14/2020: Right Pre-Dialysis Findings:  +-----------------------+----------+--------------------+---------+--------  +  Location        PSV (cm/s)Intralum. Diam. (cm)Waveform  Comments  +-----------------------+----------+--------------------+---------+--------  +  Brachial Antecub. [id number]    0.49        triphasic        +-----------------------+----------+--------------------+---------+--------  +  Radial Art at Wrist  27  0.18        biphasic         +-----------------------+----------+--------------------+---------+--------  +  Ulnar Art at Wrist   32    0.16        biphasic         +-----------------------+----------+--------------------+---------+--------  +   Left Pre-Dialysis Findings:  +-----------------------+----------+--------------------+--------+--------+   Location        PSV (cm/s)Intralum. Diam.  (cm)WaveformComments  +-----------------------+----------+--------------------+--------+--------+   Brachial Antecub. fossa76    0.40        biphasic        +-----------------------+----------+--------------------+--------+--------+   Radial Art at Wrist  46    0.17        biphasic        +-----------------------+----------+--------------------+--------+--------+   Ulnar Art at Wrist   65    0.18        biphasic        +-----------------------+----------+--------------------+--------+--------+   Summary:    Right: Stenosis noted in the [place] outflow vein (upper arm brachial     vein).  Left: No obstruction visualized in the left upper extremity.   ASSESSMENT/PLAN: 84 y.o. female with ESRD M/W/F here for evaluation of her hemodialysis access  -pt has stenosis in the AVG  outflow vein.  Discussed with Dr. Scot Dock and will schedule for a fistulogram by Dr. Scot Dock (pt wants Dr. Scot Dock to do her procedure).  He does not have availability on 6/25 so will have scheduler work with pt for date and her dialysis day will need to be changed given Dr. Scot Dock does fistulograms on Fridays.  -she did have vein mapping today and if it looks as if her right basilic vein may be an option in the future should she need new access.  I discussed with her that this is done in a couple of stages. -pt is on dialysis M/W/F at the [facility] location -pt is not on anticoagulation   Leontine Locket, [facility] [phone]  Clinic MD:   Oneida Alar

## 2020-06-14 ENCOUNTER — Ambulatory Visit (HOSPITAL_COMMUNITY)
Admission: RE | Admit: 2020-06-14 | Discharge: 2020-06-14 | Disposition: A | Discharge status: Home / Self Care | Payer: Medicare Other | Source: Ambulatory Visit | Attending: Surgery | Admitting: Surgery

## 2020-06-14 ENCOUNTER — Ambulatory Visit (INDEPENDENT_AMBULATORY_CARE_PROVIDER_SITE_OTHER)
Admission: RE | Admit: 2020-06-14 | Discharge: 2020-06-14 | Disposition: A | Discharge status: Home / Self Care | Payer: Medicare Other | Source: Ambulatory Visit | Attending: Surgery | Admitting: Surgery

## 2020-06-14 ENCOUNTER — Ambulatory Visit (INDEPENDENT_AMBULATORY_CARE_PROVIDER_SITE_OTHER): Payer: Medicare Other | Admitting: Physician Assistant

## 2020-06-14 ENCOUNTER — Other Ambulatory Visit: Payer: Self-pay

## 2020-06-14 VITALS — BP 120/84 | HR 134 | Temp 97.1°F | Resp 20 | Ht 65.0 in | Wt 124.3 lb

## 2020-06-14 DIAGNOSIS — N186 End stage renal disease: Secondary | ICD-10-CM | POA: Insufficient documentation

## 2020-06-14 DIAGNOSIS — Z992 Dependence on renal dialysis: Secondary | ICD-10-CM | POA: Insufficient documentation

## 2020-06-22 ENCOUNTER — Encounter (HOSPITAL_COMMUNITY): Payer: Self-pay | Admitting: Vascular Surgery

## 2020-06-22 ENCOUNTER — Ambulatory Visit (HOSPITAL_COMMUNITY)
Admission: RE | Admit: 2020-06-22 | Discharge: 2020-06-22 | Disposition: A | Discharge status: Home / Self Care | Payer: Medicare Other | Attending: Vascular Surgery | Admitting: Vascular Surgery

## 2020-06-22 ENCOUNTER — Other Ambulatory Visit: Payer: Self-pay

## 2020-06-22 ENCOUNTER — Encounter (HOSPITAL_COMMUNITY)
Admission: RE | Disposition: A | Discharge status: Home / Self Care | Payer: Self-pay | Source: Home / Self Care | Attending: Vascular Surgery

## 2020-06-22 DIAGNOSIS — Z881 Allergy status to other antibiotic agents status: Secondary | ICD-10-CM | POA: Insufficient documentation

## 2020-06-22 DIAGNOSIS — I509 Heart failure, unspecified: Secondary | ICD-10-CM | POA: Diagnosis not present

## 2020-06-22 DIAGNOSIS — Z95828 Presence of other vascular implants and grafts: Secondary | ICD-10-CM | POA: Diagnosis not present

## 2020-06-22 DIAGNOSIS — Z885 Allergy status to narcotic agent status: Secondary | ICD-10-CM | POA: Insufficient documentation

## 2020-06-22 DIAGNOSIS — G4700 Insomnia, unspecified: Secondary | ICD-10-CM | POA: Diagnosis not present

## 2020-06-22 DIAGNOSIS — N186 End stage renal disease: Secondary | ICD-10-CM

## 2020-06-22 DIAGNOSIS — Z9013 Acquired absence of bilateral breasts and nipples: Secondary | ICD-10-CM | POA: Insufficient documentation

## 2020-06-22 DIAGNOSIS — N183 Chronic kidney disease, stage 3 unspecified: Secondary | ICD-10-CM | POA: Diagnosis not present

## 2020-06-22 DIAGNOSIS — D509 Iron deficiency anemia, unspecified: Secondary | ICD-10-CM | POA: Diagnosis not present

## 2020-06-22 DIAGNOSIS — Z992 Dependence on renal dialysis: Secondary | ICD-10-CM | POA: Diagnosis not present

## 2020-06-22 DIAGNOSIS — I130 Hypertensive heart and chronic kidney disease with heart failure and stage 1 through stage 4 chronic kidney disease, or unspecified chronic kidney disease: Secondary | ICD-10-CM | POA: Insufficient documentation

## 2020-06-22 DIAGNOSIS — E1142 Type 2 diabetes mellitus with diabetic polyneuropathy: Secondary | ICD-10-CM | POA: Diagnosis not present

## 2020-06-22 DIAGNOSIS — Y832 Surgical operation with anastomosis, bypass or graft as the cause of abnormal reaction of the patient, or of later complication, without mention of misadventure at the time of the procedure: Secondary | ICD-10-CM | POA: Insufficient documentation

## 2020-06-22 DIAGNOSIS — Z7982 Long term (current) use of aspirin: Secondary | ICD-10-CM | POA: Insufficient documentation

## 2020-06-22 DIAGNOSIS — E1122 Type 2 diabetes mellitus with diabetic chronic kidney disease: Secondary | ICD-10-CM | POA: Insufficient documentation

## 2020-06-22 DIAGNOSIS — Z79899 Other long term (current) drug therapy: Secondary | ICD-10-CM | POA: Insufficient documentation

## 2020-06-22 DIAGNOSIS — Z9981 Dependence on supplemental oxygen: Secondary | ICD-10-CM | POA: Insufficient documentation

## 2020-06-22 DIAGNOSIS — Z880 Allergy status to penicillin: Secondary | ICD-10-CM | POA: Diagnosis not present

## 2020-06-22 DIAGNOSIS — M170 Bilateral primary osteoarthritis of knee: Secondary | ICD-10-CM | POA: Insufficient documentation

## 2020-06-22 DIAGNOSIS — Z888 Allergy status to other drugs, medicaments and biological substances status: Secondary | ICD-10-CM | POA: Insufficient documentation

## 2020-06-22 DIAGNOSIS — I480 Paroxysmal atrial fibrillation: Secondary | ICD-10-CM | POA: Diagnosis not present

## 2020-06-22 DIAGNOSIS — T82898A Other specified complication of vascular prosthetic devices, implants and grafts, initial encounter: Secondary | ICD-10-CM | POA: Diagnosis not present

## 2020-06-22 DIAGNOSIS — Z833 Family history of diabetes mellitus: Secondary | ICD-10-CM | POA: Insufficient documentation

## 2020-06-22 DIAGNOSIS — K219 Gastro-esophageal reflux disease without esophagitis: Secondary | ICD-10-CM | POA: Diagnosis not present

## 2020-06-22 DIAGNOSIS — M109 Gout, unspecified: Secondary | ICD-10-CM | POA: Insufficient documentation

## 2020-06-22 DIAGNOSIS — Z8249 Family history of ischemic heart disease and other diseases of the circulatory system: Secondary | ICD-10-CM | POA: Insufficient documentation

## 2020-06-22 DIAGNOSIS — T82858A Stenosis of vascular prosthetic devices, implants and grafts, initial encounter: Secondary | ICD-10-CM | POA: Insufficient documentation

## 2020-06-22 DIAGNOSIS — E785 Hyperlipidemia, unspecified: Secondary | ICD-10-CM | POA: Diagnosis not present

## 2020-06-22 HISTORY — PX: PERIPHERAL VASCULAR BALLOON ANGIOPLASTY: CATH118281

## 2020-06-22 LAB — POCT I-STAT, CHEM 8
BUN: 61 mg/dL — ABNORMAL HIGH (ref 8–23)
Calcium, Ion: 0.97 mmol/L — ABNORMAL LOW (ref 1.15–1.40)
Chloride: 102 mmol/L (ref 98–111)
Creatinine, Ser: 7.8 mg/dL — ABNORMAL HIGH (ref 0.44–1.00)
Glucose, Bld: 112 mg/dL — ABNORMAL HIGH (ref 70–99)
HCT: 39 % (ref 36.0–46.0)
Hemoglobin: 13.3 g/dL (ref 12.0–15.0)
Potassium: 4.3 mmol/L (ref 3.5–5.1)
Sodium: 140 mmol/L (ref 135–145)
TCO2: 29 mmol/L (ref 22–32)

## 2020-06-22 SURGERY — PERIPHERAL VASCULAR BALLOON ANGIOPLASTY
Anesthesia: LOCAL

## 2020-06-22 MED ORDER — HEPARIN SODIUM (PORCINE) 1000 UNIT/ML IJ SOLN
INTRAMUSCULAR | Status: DC | PRN
  Administered 2020-06-22: 2000 [IU] via INTRAVENOUS

## 2020-06-22 MED ORDER — SODIUM CHLORIDE 0.9% FLUSH: 3.0000 mL | INTRAVENOUS | Status: DC | PRN

## 2020-06-22 MED ORDER — LIDOCAINE HCL (PF) 1 % IJ SOLN
INTRAMUSCULAR | Status: DC | PRN
  Administered 2020-06-22: 5 mL via SUBCUTANEOUS

## 2020-06-22 MED ORDER — IODIXANOL 320 MG/ML IV SOLN
INTRAVENOUS | Status: DC | PRN
  Administered 2020-06-22: 60 mL

## 2020-06-22 MED ORDER — HEPARIN SODIUM (PORCINE) 1000 UNIT/ML IJ SOLN
INTRAMUSCULAR | Status: AC
  Filled 2020-06-22: qty 1

## 2020-06-22 MED ORDER — LIDOCAINE HCL (PF) 1 % IJ SOLN
INTRAMUSCULAR | Status: AC
  Filled 2020-06-22: qty 30

## 2020-06-22 MED ORDER — HEPARIN (PORCINE) IN NACL 1000-0.9 UT/500ML-% IV SOLN
INTRAVENOUS | Status: AC
  Filled 2020-06-22: qty 500

## 2020-06-22 MED ORDER — SODIUM CHLORIDE 0.9% FLUSH: 3.0000 mL | Freq: Two times a day (BID) | INTRAVENOUS | Status: DC

## 2020-06-22 MED ORDER — NITROGLYCERIN 1 MG/10 ML FOR IR/CATH LAB
INTRA_ARTERIAL | Status: DC | PRN
  Administered 2020-06-22: 400 ug
  Administered 2020-06-22: 200 ug

## 2020-06-22 MED ORDER — SODIUM CHLORIDE 0.9 % IV SOLN: 250.0000 mL | INTRAVENOUS | Status: DC | PRN

## 2020-06-22 MED ORDER — HEPARIN (PORCINE) IN NACL 1000-0.9 UT/500ML-% IV SOLN
INTRAVENOUS | Status: DC | PRN
  Administered 2020-06-22: 500 mL

## 2020-06-22 MED ORDER — NITROGLYCERIN 1 MG/10 ML FOR IR/CATH LAB
INTRA_ARTERIAL | Status: AC
  Filled 2020-06-22: qty 10

## 2020-06-22 SURGICAL SUPPLY — 16 items
BAG SNAP BAND KOVER 36X36 (MISCELLANEOUS) ×3 IMPLANT
BALLN MUSTANG 7.0X40 75 (BALLOONS) ×3
BALLN MUSTANG 8X60X75 (BALLOONS) ×3
BALLOON MUSTANG 7.0X40 75 (BALLOONS) ×1 IMPLANT
BALLOON MUSTANG 8X60X75 (BALLOONS) ×1 IMPLANT
COVER DOME SNAP 22 D (MISCELLANEOUS) ×3 IMPLANT
KIT ENCORE 26 ADVANTAGE (KITS) ×2 IMPLANT
KIT MICROPUNCTURE NIT STIFF (SHEATH) ×2 IMPLANT
PROTECTION STATION PRESSURIZED (MISCELLANEOUS) ×3
SHEATH PINNACLE R/O II 6F 4CM (SHEATH) ×2 IMPLANT
SHEATH PROBE COVER 6X72 (BAG) ×3 IMPLANT
STATION PROTECTION PRESSURIZED (MISCELLANEOUS) ×2 IMPLANT
STOPCOCK MORSE 400PSI 3WAY (MISCELLANEOUS) ×3 IMPLANT
TRAY PV CATH (CUSTOM PROCEDURE TRAY) ×3 IMPLANT
TUBING CIL FLEX 10 FLL-RA (TUBING) ×3 IMPLANT
WIRE BENTSON .035X145CM (WIRE) ×2 IMPLANT

## 2020-06-22 NOTE — Interval H&P Note (Signed)
History and Physical Interval Note:  06/22/2020 11:24 AM  [name] James  has presented today for surgery, with the diagnosis of End stage renal.  The various methods of treatment have been discussed with the patient and family. After consideration of risks, benefits and other options for treatment, the patient has consented to  Procedure(s): A/V FISTULAGRAM (N/A) as a surgical intervention.  The patient's history has been reviewed, patient examined, no change in status, stable for surgery.  I have reviewed the patient's chart and labs.  Questions were answered to the patient's satisfaction.     [name]

## 2020-06-22 NOTE — Op Note (Signed)
   PATIENT: [name] James      MRN: [id number] DOB: Jan 31, 1936    DATE OF PROCEDURE: 06/22/2020  INDICATIONS:    RUCHEL BRANDENBURGER is a 84 y.o. female who presents with a poorly functioning right arm graft.  Duplex showed outflow stenosis and she presents for a fistulogram.  PROCEDURE:    1.  Ultrasound-guided access to right AV graft 2.  Fistulogram right forearm AV graft 3.  Venoplasty of brachial vein stenosis with 7 mm x 4 cm balloon 4.  Venoplasty of tendon brachial vein stenosis with 8 mm x 4 cm balloon  SURGEON: Judeth Cornfield. Scot Dock, MD, FACS  ANESTHESIA: Local  EBL: Minimal  TECHNIQUE: Patient was taken to the peripheral vascular lab.  The right arm was prepped and draped in usual sterile fashion.  Under ultrasound guidance, after the skin was anesthetized, I cannulated the venous limb of the graft near the distal loop.  A micropuncture sheath was introduced over a micropuncture wire.  There was no clot or plaque in the graft at this level.  An ultrasound image was sent to the server.  A fistulogram was done initially to evaluate the central veins.  There was a stenosis in the brachial vein in the upper arm producing a 99% stenosis.  This was addressed with a 7 mm x 4 cm balloon.  There was some mild residual stenosis on follow-up fistulogram.  I therefore went back with an 8 mm x 6 cm balloon which was inflated to nominal pressure.  A retrograde shot was obtained to evaluate the arterial anastomosis and the graft itself.  Next the balloon was deflated and a follow-up film showed a stenosis near the venous anastomosis just above the antecubital level.  This was addressed with a 7 mm x 4 cm balloon.  There was some residual stenosis in the upper brachial vein so I went back with the 8 mm x 6 cm balloon after giving nitroglycerin.  Completion film showed minimal residual stenosis.  The cannulation site was closed with a 4-0 Monocryl.  Patient tolerated the procedure well.  FINDINGS:    1.  Patent right forearm AV graft 2.  No central venous stenosis. 3.  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 4.  No problems with the graft itself. 5.  No problems at the arterial anastomosis.  [name], MD, FACS [facility]  DATE OF DICTATION:   06/22/2020

## 2020-06-22 NOTE — Progress Notes (Addendum)
Transport called spoke with [name] James informed him of d/c time of 1330 voices understanding.Spoke with pt daughter [name] James informed her of discharge time and given update. Attempted to call [facility] with no answer

## 2020-06-22 NOTE — Discharge Instructions (Signed)
Dialysis Fistulogram  A dialysis fistulogram is an X-ray test to look inside the site where blood is removed and returned to your body during dialysis. Fistulas and grafts provide easy and effective access for dialysis. However, sometimes problems can occur. The fistula or graft can become clogged or narrow. This can make dialysis less effective. You may need to have a fistulogram to check for these problems. If a problem is found, your health care provider can do treatments during the procedure to clear the blocked or narrowed areas. Treatments to open a blocked dialysis fistula or graft may include:  Removing the clot from the fistula or graft with a device (thrombectomy).  A procedure to open or widen the blocked area with a balloon (angioplasty).  Placing a small mesh tube (stent) to keep the fistula or graft open.  Injecting a medicine to dissolve the clot (thrombolysis). Tell a health care provider about:  Any allergies you have, including any reactions you have had to contrast dye.  All medicines you are taking, including vitamins, herbs, eye drops, creams, and over-the-counter medicines.  Any problems you or family members have had with anesthetic medicines.  Any blood disorders you have.  Any surgeries you have had.  Any medical conditions you have.  Whether you are pregnant or may be pregnant.  Any pain, redness, or swelling you have in your limb that has the dialysis fistula or graft.  Any bleeding from your dialysis fistula or graft.  Any of the following in the part of your body where the dialysis fistula or graft leads: ? Numbness. ? Tingling. ? A cold feeling. ? Areas that are bluish or pale white in color. What are the risks? Generally, this is a safe procedure. However, problems may occur, including:  Infection.  Bleeding.  Allergic reactions to medicines or dyes.  Damage to other structures, such as nearby nerves or blood vessels.  A blood clot in the  dialysis fistula or graft.  A blood clot that travels to your lungs (pulmonary embolism). What happens before the procedure? General instructions  Follow instructions from your health care provider about eating and drinking restrictions.  Ask your health care provider about: ? Changing or stopping your regular medicines. This is especially important if you are taking diabetes medicines or blood thinners (anticoagulants). ? Taking medicines such as aspirin and ibuprofen. These medicines can thin your blood. Do not take these medicines unless your health care provider tells you to take them. ? Taking over-the-counter medicines, vitamins, herbs, and supplements.  Plan to have someone take you home from the hospital or clinic.  Plan to have a responsible adult care for you for at least 24 hours after you leave the hospital or clinic. This is important.  You may have a blood or urine sample taken. These will help your health care provider learn how well your kidneys and liver are working and how well your blood clots. What happens during the procedure?  An IV will be inserted into one of your veins.  You will be given one or more of the following: ? A medicine to help you relax (sedative). ? A medicine to numb the area (local anesthetic) on the limb with your fistula or graft.  A needle will be inserted into your vein.  A small, thin tube (catheter) will be inserted into the needle and guided to the area of possible problems.  Dye (contrast material) will be injected into the catheter. As the contrast material passes through   your body, you may feel warm.  X-rays will be taken. The contrast material will show where any narrowing or blockages are.  If narrowing or a blockage is seen, the health care provider may do one of the following to open the blockage: ? Thrombectomy. The health care provider will use a mechanical device at the end of the catheter to break up the  clot. ? Angioplasty. The health care provider will advance a guide wire and balloon-tipped catheter to the blockage site. The balloon will be inflated for a short period of time. A stent may also be placed to keep the blocked area open. ? Thrombolysis. The health care provider will deliver a clot-dissolving medicine through the catheter.  When the procedure is complete, the catheter will be removed.  In some cases, the catheter site will be closed with stitches (sutures).  Pressure will be applied to stop any bleeding, and your skin will be covered with a bandage (dressing). The procedure may vary among health care providers and hospitals. What happens after the procedure?  Your blood pressure, heart rate, breathing rate, and blood oxygen level will be monitored until you leave the hospital or clinic. Your health care provider will also monitor your access site.  You will need to stay in bed for several hours.  Do not drive for 24 hours if you were given a sedative during your procedure. Summary  A dialysis fistulogram is an X-ray test to look inside the site where blood is removed and returned to your body during dialysis.  If a problem is found, your health care provider can also do treatments during the procedure to clear the blocked or narrowed areas. This information is not intended to replace advice given to you by your health care provider. Make sure you discuss any questions you have with your health care provider. Document Revised: 01/08/2018 Document Reviewed: 01/08/2018 Elsevier Patient Education  2020 [employer].

## 2020-06-22 NOTE — Progress Notes (Signed)
Discharge instructions reviewed with Vaughan Basta (nurse at [facility])

## 2020-07-16 ENCOUNTER — Other Ambulatory Visit: Payer: Self-pay | Admitting: *Deleted

## 2020-07-16 DIAGNOSIS — N186 End stage renal disease: Secondary | ICD-10-CM

## 2020-07-19 ENCOUNTER — Ambulatory Visit: Payer: Medicare Other | Admitting: Pulmonary Disease

## 2020-07-19 ENCOUNTER — Ambulatory Visit (HOSPITAL_COMMUNITY)
Admission: RE | Admit: 2020-07-19 | Discharge: 2020-07-19 | Disposition: A | Discharge status: Home / Self Care | Payer: Medicare Other | Source: Ambulatory Visit | Attending: Vascular Surgery | Admitting: Vascular Surgery

## 2020-07-19 ENCOUNTER — Other Ambulatory Visit: Payer: Self-pay

## 2020-07-19 ENCOUNTER — Encounter: Payer: Self-pay | Admitting: Physician Assistant

## 2020-07-19 ENCOUNTER — Ambulatory Visit (INDEPENDENT_AMBULATORY_CARE_PROVIDER_SITE_OTHER): Payer: Medicare Other | Admitting: Physician Assistant

## 2020-07-19 VITALS — BP 154/66 | HR 72 | Temp 97.6°F | Resp 20 | Ht 65.0 in

## 2020-07-19 DIAGNOSIS — Z992 Dependence on renal dialysis: Secondary | ICD-10-CM

## 2020-07-19 DIAGNOSIS — N186 End stage renal disease: Secondary | ICD-10-CM | POA: Insufficient documentation

## 2020-07-19 NOTE — Progress Notes (Signed)
Established Dialysis Access   History of Present Illness   [name] James is a 84 y.o. (1936-05-23) female who presents for re-evaluation of permanent access.  Right forearm loop graft was placed by Dr. Scot Dock on 12/13/2019.  She required a fistulogram on 02/03/2020 for tandem outflow lesions.  She again required a fistulogram on 06/22/2020 for recurrent tendon lesions.  Duplex in office today demonstrates a widely patent AV graft and patent outflow.  Patient states graft is working well for hemodialysis however she occasionally is experiencing a sharp shooting pain that travels up her arm and into her neck which then takes her breath away.  This does not happen every time however it is intolerable when it does happen.  She denies any signs or symptoms of a steal syndrome in her right hand.  She denies any prolonged bleeding from stick sites.  She currently resides in a nursing facility and requires home O2.  The patient's PMH, PSH, SH, and FamHx were reviewed and are unchanged from prior visit.  Current Outpatient Medications  Medication Sig Dispense Refill  . acetaminophen (TYLENOL) 325 MG tablet Take 650 mg by mouth every 6 (six) hours as needed for fever.     Marland Kitchen allopurinol (ZYLOPRIM) 100 MG tablet Take 100 mg by mouth daily. (0900)    . aspirin 81 MG chewable tablet Chew 81 mg by mouth daily. (0900)    . B Complex-C-Folic Acid (NEPHRO VITAMINS) 0.8 MG TABS Take 1 tablet by mouth daily. (0900)    . betamethasone dipropionate 0.05 % cream Apply 1 application topically in the morning and at bedtime. Rash on right arm and shoulder (0800 & 2000)    . Brinzolamide-Brimonidine (SIMBRINZA) 1-0.2 % SUSP Place 1 drop into both eyes 2 (two) times daily. (0900 & 2100)    . calcium carbonate (TUMS - DOSED IN MG ELEMENTAL CALCIUM) 500 MG chewable tablet Chew 1 tablet by mouth 2 (two) times daily.    . cetirizine (ZYRTEC) 10 MG tablet Take 10 mg by mouth daily.    . cholestyramine (QUESTRAN) 4 g packet  Take 1 packet (4 g total) by mouth 2 (two) times daily. Take twice daily before breakfast and dinner (Patient taking differently: Take 4 g by mouth 2 (two) times daily as needed (removal of bile acids.). ) 60 each 0  . diazepam (VALIUM) 5 MG tablet Take 1 tablet (5 mg total) by mouth at bedtime. (Patient taking differently: Take 5 mg by mouth at bedtime. (2200)) 5 tablet 0  . diclofenac Sodium (VOLTAREN) 1 % GEL Apply 2 g topically at bedtime. (2100) applied to right knee for pain.    Marland Kitchen DIMETHICONE, TOPICAL, 5 % CREA Apply 1 application topically See admin instructions. Place a nickel-sized amount onto buttock(s) area 2 times a day for barrier protection    . diphenoxylate-atropine (LOMOTIL) 2.5-0.025 MG tablet Take 1 tablet by mouth See admin instructions. Give 1 tablet by mouth on Mondays, Wednesdays, & Fridays before hemodialysis--patient may refuse if she feels she does not need it. Give 1 tablet by mouth 4 times daily if needed for diarrhea/loose stools.    . fluticasone (FLONASE) 50 MCG/ACT nasal spray Place 2 sprays into both nostrils daily. (0800)    . loperamide (IMODIUM A-D) 2 MG tablet Take 2 tablets (4 mg total) by mouth every 8 (eight) hours as needed for diarrhea or loose stools (for diarrhea). (Patient taking differently: Take 4 mg by mouth every Monday, Wednesday, and Friday with hemodialysis. BEFORE  HEMODIALYSIS (PATIENT MAY REFUSE IF SHE DOES NOT FEEL SHE NEEDS IT)) 50 tablet 2  . Melatonin 5 MG CAPS Take 10 mg by mouth at bedtime. (2100)    . metoprolol tartrate (LOPRESSOR) 25 MG tablet Take 25 mg by mouth daily. (0900) Hold if hr <60 or SBP>105    . midodrine (PROAMATINE) 2.5 MG tablet Take 2.5 mg by mouth 2 (two) times daily. (1600 & 2200) Give if bp is < 120/60    . NONFORMULARY OR COMPOUNDED ITEM Apply 1 application topically as needed (irritation associated with moisture). Zinc/Nystatin/TAC Compound (1:1:1) Apply to sacral region and to bilateral buttocks as needed for moisture  associated irritation    . ondansetron (ZOFRAN) 4 MG tablet Take by mouth.    . OXYGEN Inhale 2 L into the lungs continuous.     Marland Kitchen SALINE NASAL MIST NA Place 2 sprays into the nose 3 (three) times daily.    . traZODone (DESYREL) 50 MG tablet Take 50 mg by mouth at bedtime.     . triamcinolone cream (KENALOG) 0.1 % Apply 1 application topically 2 (two) times daily. (0900 & 2100)     No current facility-administered medications for this visit.    REVIEW OF SYSTEMS (negative unless checked):   Cardiac:  []  Chest pain or chest pressure? []  Shortness of breath upon activity? []  Shortness of breath when lying flat? []  Irregular heart rhythm?  Vascular:  []  Pain in calf, thigh, or hip brought on by walking? []  Pain in feet at night that wakes you up from your sleep? []  Blood clot in your veins? []  Leg swelling?  Pulmonary:  []  Oxygen at home? []  Productive cough? []  Wheezing?  Neurologic:  []  Sudden weakness in arms or legs? []  Sudden numbness in arms or legs? []  Sudden onset of difficult speaking or slurred speech? []  Temporary loss of vision in one eye? []  Problems with dizziness?  Gastrointestinal:  []  Blood in stool? []  Vomited blood?  Genitourinary:  []  Burning when urinating? []  Blood in urine?  Psychiatric:  []  Major depression  Hematologic:  []  Bleeding problems? []  Problems with blood clotting?  Dermatologic:  []  Rashes or ulcers?  Constitutional:  []  Fever or chills?  Ear/Nose/Throat:  []  Change in hearing? []  Nose bleeds? []  Sore throat?  Musculoskeletal:  []  Back pain? []  Joint pain? []  Muscle pain?   Physical Examination   Vitals:   07/19/20 0833  BP: (!) 154/66  Pulse: 72  Resp: 20  Temp: 97.6 F (36.4 C)  TempSrc: Temporal  SpO2: 98%  Height: 5\' 5"  (1.651 m)   Body mass index is 20.63 kg/m.  General:  WDWN in NAD; vital signs documented above Gait: Not observed HENT: WNL, normocephalic Pulmonary: normal non-labored  breathing , without Rales, rhonchi,  wheezing Cardiac: regular HR Abdomen: soft, NT, no masses Skin: without rashes Vascular Exam/Pulses:  Right Left  Radial 2+ (normal) 2+ (normal)   Extremities: Easily palpable thrill throughout AV graft and in outflow vein of upper arm; some inflammation from overuse and lateral aspect of forearm loop graft however no bleeding, ulceration, or sign of infection Musculoskeletal: no muscle wasting or atrophy  Neurologic: A&O X 3;  No focal weakness or paresthesias are detected Psychiatric:  The pt has Normal affect.   Non-invasive Vascular Imaging   right Arm Access Duplex  :   Widely patent right AV graft  Widely patent outflow without any areas of stenosis      Medical Decision Making  [name] James is a 84 y.o. female who presents with ESRD requiring hemodialysis.    Patent AV graft without signs or symptoms of steal syndrome  Unsure of the etiology of pain that radiates from her graft up into her neck  We discussed avoiding areas of overuse when accessing graft  If these symptoms persist and become intolerable to the patient, the only solution would be to abandon right arm graft and create new access in her left arm and placement of a TDC.  Patient agrees that this course of action would be extremely unnecessary and hopefully we can avoid this in the future  Patient will follow up as needed and will contact the office if symptoms become intolerable or if she has any further issues with her dialysis access   Dagoberto Ligas PA-C [facility] Office: [phone]  Clinic MD: Scot Dock

## 2020-08-01 ENCOUNTER — Emergency Department (HOSPITAL_COMMUNITY): Payer: Medicare Other

## 2020-08-01 ENCOUNTER — Encounter (HOSPITAL_COMMUNITY): Payer: Self-pay | Admitting: Emergency Medicine

## 2020-08-01 ENCOUNTER — Emergency Department (EMERGENCY_DEPARTMENT_HOSPITAL)
Admission: EM | Admit: 2020-08-01 | Discharge: 2020-08-01 | Disposition: A | Discharge status: Home / Self Care | Payer: Medicare Other | Source: Home / Self Care | Attending: Emergency Medicine | Admitting: Emergency Medicine

## 2020-08-01 DIAGNOSIS — N186 End stage renal disease: Secondary | ICD-10-CM | POA: Diagnosis not present

## 2020-08-01 DIAGNOSIS — I4891 Unspecified atrial fibrillation: Secondary | ICD-10-CM

## 2020-08-01 DIAGNOSIS — E119 Type 2 diabetes mellitus without complications: Secondary | ICD-10-CM | POA: Insufficient documentation

## 2020-08-01 DIAGNOSIS — R079 Chest pain, unspecified: Secondary | ICD-10-CM

## 2020-08-01 DIAGNOSIS — I129 Hypertensive chronic kidney disease with stage 1 through stage 4 chronic kidney disease, or unspecified chronic kidney disease: Secondary | ICD-10-CM | POA: Insufficient documentation

## 2020-08-01 DIAGNOSIS — N183 Chronic kidney disease, stage 3 unspecified: Secondary | ICD-10-CM | POA: Insufficient documentation

## 2020-08-01 DIAGNOSIS — I480 Paroxysmal atrial fibrillation: Secondary | ICD-10-CM

## 2020-08-01 DIAGNOSIS — R001 Bradycardia, unspecified: Secondary | ICD-10-CM

## 2020-08-01 DIAGNOSIS — R002 Palpitations: Secondary | ICD-10-CM

## 2020-08-01 LAB — I-STAT CHEM 8, ED
BUN: 24 mg/dL — ABNORMAL HIGH (ref 8–23)
Calcium, Ion: 1.09 mmol/L — ABNORMAL LOW (ref 1.15–1.40)
Chloride: 97 mmol/L — ABNORMAL LOW (ref 98–111)
Creatinine, Ser: 3.4 mg/dL — ABNORMAL HIGH (ref 0.44–1.00)
Glucose, Bld: 100 mg/dL — ABNORMAL HIGH (ref 70–99)
HCT: 38 % (ref 36.0–46.0)
Hemoglobin: 12.9 g/dL (ref 12.0–15.0)
Potassium: 3.1 mmol/L — ABNORMAL LOW (ref 3.5–5.1)
Sodium: 140 mmol/L (ref 135–145)
TCO2: 30 mmol/L (ref 22–32)

## 2020-08-01 LAB — COMPREHENSIVE METABOLIC PANEL
ALT: 13 U/L (ref 0–44)
AST: 24 U/L (ref 15–41)
Albumin: 2.7 g/dL — ABNORMAL LOW (ref 3.5–5.0)
Alkaline Phosphatase: 81 U/L (ref 38–126)
Anion gap: 13 (ref 5–15)
BUN: 22 mg/dL (ref 8–23)
CO2: 29 mmol/L (ref 22–32)
Calcium: 8.3 mg/dL — ABNORMAL LOW (ref 8.9–10.3)
Chloride: 98 mmol/L (ref 98–111)
Creatinine, Ser: 3.63 mg/dL — ABNORMAL HIGH (ref 0.44–1.00)
GFR calc Af Amer: 13 mL/min — ABNORMAL LOW (ref >60)
GFR calc non Af Amer: 11 mL/min — ABNORMAL LOW (ref >60)
Glucose, Bld: 106 mg/dL — ABNORMAL HIGH (ref 70–99)
Potassium: 3.2 mmol/L — ABNORMAL LOW (ref 3.5–5.1)
Sodium: 140 mmol/L (ref 135–145)
Total Bilirubin: 0.8 mg/dL (ref 0.3–1.2)
Total Protein: 6.9 g/dL (ref 6.5–8.1)

## 2020-08-01 LAB — CBC WITH DIFFERENTIAL/PLATELET
Abs Immature Granulocytes: 0.03 10*3/uL (ref 0.00–0.07)
Basophils Absolute: 0.1 10*3/uL (ref 0.0–0.1)
Basophils Relative: 1 %
Eosinophils Absolute: 0.2 10*3/uL (ref 0.0–0.5)
Eosinophils Relative: 3 %
HCT: 37.3 % (ref 36.0–46.0)
Hemoglobin: 11.1 g/dL — ABNORMAL LOW (ref 12.0–15.0)
Immature Granulocytes: 1 %
Lymphocytes Relative: 17 %
Lymphs Abs: 1.1 10*3/uL (ref 0.7–4.0)
MCH: 32.2 pg (ref 26.0–34.0)
MCHC: 29.8 g/dL — ABNORMAL LOW (ref 30.0–36.0)
MCV: 108.1 fL — ABNORMAL HIGH (ref 80.0–100.0)
Monocytes Absolute: 0.6 10*3/uL (ref 0.1–1.0)
Monocytes Relative: 9 %
Neutro Abs: 4.5 10*3/uL (ref 1.7–7.7)
Neutrophils Relative %: 69 %
Platelets: 177 10*3/uL (ref 150–400)
RBC: 3.45 MIL/uL — ABNORMAL LOW (ref 3.87–5.11)
RDW: 15.1 % (ref 11.5–15.5)
WBC: 6.5 10*3/uL (ref 4.0–10.5)
nRBC: 0 % (ref 0.0–0.2)

## 2020-08-01 LAB — MAGNESIUM
Magnesium: 1.9 mg/dL (ref 1.7–2.4)
Magnesium: 2 mg/dL (ref 1.7–2.4)

## 2020-08-01 LAB — TROPONIN I (HIGH SENSITIVITY)
Troponin I (High Sensitivity): 26 ng/L — ABNORMAL HIGH (ref <18)
Troponin I (High Sensitivity): 30 ng/L — ABNORMAL HIGH

## 2020-08-01 LAB — BRAIN NATRIURETIC PEPTIDE: B Natriuretic Peptide: 1092 pg/mL — ABNORMAL HIGH (ref 0.0–100.0)

## 2020-08-01 LAB — TSH: TSH: 0.947 u[IU]/mL (ref 0.350–4.500)

## 2020-08-01 MED ORDER — SODIUM CHLORIDE 0.9 % IV BOLUS
500.0000 mL | Freq: Once | INTRAVENOUS | Status: AC
  Administered 2020-08-01: 500 mL via INTRAVENOUS

## 2020-08-01 MED ORDER — FUROSEMIDE 10 MG/ML IJ SOLN: 40.0000 mg | Freq: Once | INTRAMUSCULAR | Status: DC

## 2020-08-01 MED ORDER — AMIODARONE HCL 200 MG PO TABS
200.0000 mg | ORAL_TABLET | Freq: Two times a day (BID) | ORAL | 0 refills | Status: DC
Start: 2020-08-01 — End: 2020-08-10

## 2020-08-01 MED ORDER — AMIODARONE HCL 200 MG PO TABS: 200.0000 mg | ORAL_TABLET | Freq: Every day | ORAL | Status: DC

## 2020-08-01 MED ORDER — AMIODARONE HCL 200 MG PO TABS
200.0000 mg | ORAL_TABLET | Freq: Two times a day (BID) | ORAL | Status: DC
  Administered 2020-08-01: 200 mg via ORAL
  Filled 2020-08-01: qty 1

## 2020-08-01 MED ORDER — METOPROLOL TARTRATE 5 MG/5ML IV SOLN
5.0000 mg | Freq: Once | INTRAVENOUS | Status: AC
  Administered 2020-08-01: 5 mg via INTRAVENOUS
  Filled 2020-08-01: qty 5

## 2020-08-01 MED ORDER — POTASSIUM CHLORIDE CRYS ER 20 MEQ PO TBCR
40.0000 meq | EXTENDED_RELEASE_TABLET | Freq: Once | ORAL | Status: AC
  Administered 2020-08-01: 40 meq via ORAL
  Filled 2020-08-01: qty 2

## 2020-08-01 NOTE — ED Notes (Signed)
[facility] has arrived for pt , pt in NAD as seen leaving, Elenora Fender has all paperwork , all questions answered

## 2020-08-01 NOTE — Discharge Instructions (Signed)
Stop metoprolol. Take amiodarone 200 mg twice daily instead.  Please get dialysis as scheduled. You may need extra fluid taken off   See cardiology for follow-up  Return to ER if you have worse palpitation, chest pain, shortness of breath.

## 2020-08-01 NOTE — ED Notes (Signed)
Pt in NSR, repeat EKG completed and given to Dr Sedonia Small

## 2020-08-01 NOTE — ED Notes (Signed)
14 on the for ptar called by dee

## 2020-08-01 NOTE — ED Triage Notes (Addendum)
Pt arrives from [facility] placed with c/o palpations that started this morning but now resolved. Pt has no pain. Pt is alert and ox4. Pt is dialysis pt MWF pt needs dialysis today.

## 2020-08-01 NOTE — Consult Note (Addendum)
Cardiology Admission History and Physical:   Patient ID: [name] James MRN: [id number]; DOB: 06/01/1936   Admission date: 08/01/2020  Primary Care Provider: [facility] Cardiologist: Sanda Crews Mccollam, MD  [facility] Electrophysiologist:  None   Chief Complaint:  Palpitations  Patient Profile:   [name] James is a 84 y.o. female with pmh of Afib (not candidate for a/c due to GIB), DM2, known aortic and coronary atherosclerosis on chest CT, normal nuclear stress test in 2015 and normal LVEF, tach-brady syndrome deemed bad PPM candidate, and ESRD (MWF) presenting to the hospital with palpitations.   History of Present Illness:   [name] James is followed by Dr. Sallyanne Kuster for the above cardiac issues. The patient had COVID infection in May 2020 and recovered well. She developed Afib with RVR. She is a resident at [facility] and goes to HD 3 days a week. She is on chronic O2. She has intermittent palpitations. She was last seen 04/10/20 and was otherwise doing well. She has seen EP in the past for sinus node dysfunction. Patient had periods of symptomatic junctional bradycardia. Patient did not tolerate antiarrhythmics (amiodarone and metoprolol). She was evaluated by 2 different EP specialist who deemed her a poor candidate for device therapy. Patient has history of hypotension which mainly occurs during HD. She has evidence of peripheral arterial disease and coronary calcifications on chest CT, however has been asymptomatic.   The patient presented to the ED 08/01/20 for palpitations. Patient has afib and intermittently has palpitations. For the last couple weeks they have been more frequent. The last 2 nights patient has had multiple severe episodes of palpitations. Last night she had chest pain with the episode. CP was centralized and pressure-like. 5/10. Pain seemed to come and go with  the palpitations. Nothing seems to trigger the episodes. She felt short of breath as well.  She has nausea but this is chronic. Also has chronic abdominal pain. This morning palpitations continued despite breathing techniques. She takes metoprolol daily for rate control. The patient generally felt tired and had a headache so she went to the ED for evaluation.   In the ED BP 162/62, pulse 71, afebrile, 100% O2, RR 20. Labs showed potassium 3.2, glucose 106, calcium 8.3, albumin 2.7, WBC 6.5, Hgb 11.1. HS troponin 26. CXR showed mild pulmonary venous congestion, new or increased, otherwise similar appearnce of of L>R pleural effusions. EKG showed NSR with nonspecific ST/T wave changes. Tele shows patient going in and out of afib, rates up to 140s at times. She was given IV metoprolol 5 mg once and IVF, 500 mL bolus.  Cardiology was consulted.   On my interview the patient is resting comfortably in sinus rhythm. She is accompanied by her daughter. She denies chest pain. She is not very functional at baseline and rarely walks. Patient planned to see pulmonologist this week for ?ptx or ?pleural effusions.    Past Medical History:  Diagnosis Date   Anemia, chronic disease    Anxiety    Arthritis    knees   Atrial fibrillation (HCC)    Barrett esophagus    Benign paroxysmal positional vertigo    Breast cancer ([place]) 1980s   bil mastectomies, no radiation or chemo   Cataract    Chest pain, atypical 12/05/2008   R/Lmv- normal perfusion all regions, noe ECG changes    CHF (congestive heart failure) ([place]) 05/13/2011   echo - JF>35%; stage 1 diastolic dysfunction; elevated LV filling pressure, MAC  CKD (chronic kidney disease), stage III    Claudication ([place]) 10/30/2005   doppler - normal evaluation, no evidence of aneurysm, diameter reduction, dissection, compression or vascular abnormality)   Complication of anesthesia    Controlled type 2 diabetes mellitus with diabetic polyneuropathy, with long-term current use of insulin ([place]) 04/17/2016   Dyslipidemia    Dysrhythmia     Atrial Fibrillation   Edema    ESRD (end stage renal disease) on dialysis ([place])    "[facility]; [address]; MWF" (07/06/2018)   Family history of adverse reaction to anesthesia    son also has nausea    Fever blister 12/2016   Gastritis and gastroduodenitis    GERD (gastroesophageal reflux disease)    Gout    Hemodialysis-associated hypotension    HLD (hyperlipidemia)    Hypertension    IBS (irritable bowel syndrome)    Insomnia    Iron deficiency anemia    Junctional bradycardia 08/25/2018   LFT elevation    Long term current use of amiodarone 10/17/2016   Morbid (severe) obesity due to excess calories ([place]) 04/29/2017   Morbid obesity ([place])    On home oxygen therapy    "2L; 24/7" (07/06/2018)   PAF (paroxysmal atrial fibrillation) (HCC)    Peripheral neuropathy    Pneumonia 12/2016   PONV (postoperative nausea and vomiting)    Renal cyst    Renal insufficiency 10/30/2005   doppler - abn resistance consistent w/ parenchymal disease   Renovascular hypertension    Type 2 diabetes mellitus with diabetic neuropathy, with long-term current use of insulin ([place])    Vertigo     Past Surgical History:  Procedure Laterality Date   A/V FISTULAGRAM Left 03/19/2018   Procedure: A/V FISTULAGRAM;  Surgeon: Angelia Mould, MD;  Location: [place] [facility];  Service: Cardiovascular;  Laterality: Left;   A/V FISTULAGRAM N/A 12/24/2018   Procedure: A/V FISTULAGRAM - Right Arm;  Surgeon: Angelia Mould, MD;  Location: [place] [facility];  Service: Cardiovascular;  Laterality: N/A;   A/V FISTULAGRAM Right 03/29/2019   Procedure: A/V FISTULAGRAM;  Surgeon: Angelia Mould, MD;  Location: [place] [facility];  Service: Cardiovascular;  Laterality: Right;   A/V FISTULAGRAM Right 02/03/2020   Procedure: A/V FISTULAGRAM;  Surgeon: Angelia Mould, MD;  Location: [place] [facility];  Service: Cardiovascular;  Laterality: Right;   A/V SHUNTOGRAM Left  01/19/2018   Procedure: A/V SHUNTOGRAM;  Surgeon: Serafina Mitchell, MD;  Location: [place] [facility];  Service: Cardiovascular;  Laterality: Left;   ANGIOPLASTY Left 02/12/2015   Procedure: ANGIOPLASTY;  Surgeon: Angelia Mould, MD;  Location: [facility] CATH LAB;  Service: Cardiovascular;  Laterality: Left;  AVF   AV FISTULA PLACEMENT Left 11/30/2014   Procedure: ARTERIOVENOUS (AV) FISTULA CREATION LEFT ARM;  Surgeon: Angelia Mould, MD;  Location: [place];  Service: Vascular;  Laterality: Left;   AV FISTULA PLACEMENT Left 03/20/2015   Procedure:  Inserton of Left Upper Arm Gortex Graft;  Surgeon: Angelia Mould, MD;  Location: [place];  Service: Vascular;  Laterality: Left;   AV FISTULA PLACEMENT Left 04/12/2015   Procedure: INSERTION OF LEFT ARM  ARTERIOVENOUS GORE-TEX GRAFT ;  Surgeon: Angelia Mould, MD;  Location: [place];  Service: Vascular;  Laterality: Left;   AV FISTULA PLACEMENT Right 06/08/2018   Procedure: INSERTION OF ARTERIOVENOUS (AV) GORE-TEX GRAFT ARM USING 4-7MM X 45CM GORETEX GRAFT;  Surgeon: Angelia Mould, MD;  Location: [place];  Service:  Vascular;  Laterality: Right;   BACK SURGERY  ~1980   CATARACT EXTRACTION Right    CHOLECYSTECTOMY     ESOPHAGOGASTRODUODENOSCOPY N/A 10/24/2014   Procedure: ESOPHAGOGASTRODUODENOSCOPY (EGD);  Surgeon: Jerene Bears, MD;  Location: [facility] ENDOSCOPY;  Service: Endoscopy;  Laterality: N/A;   EXCHANGE OF A DIALYSIS CATHETER Left 11/30/2014   Procedure: EXCHANGE OF A DIALYSIS CATHETER, LEFT INTERNAL JUGULAR;  Surgeon: Angelia Mould, MD;  Location: [place];  Service: Vascular;  Laterality: Left;   EYE SURGERY Left    transplant   FISTULOGRAM N/A 02/12/2015   Procedure: FISTULOGRAM;  Surgeon: Angelia Mould, MD;  Location: [facility] CATH LAB;  Service: Cardiovascular;  Laterality: N/A;   HEMORRHOID SURGERY     IR AV DIALY SHUNT INTRO NEEDLE/INTRACATH INITIAL W/PTA/IMG RIGHT Right 08/25/2018   IR AV DIALY SHUNT  INTRO NEEDLE/INTRACATH INITIAL W/PTA/IMG RIGHT Right 11/17/2018   IR US GUIDE VASC ACCESS RIGHT  08/25/2018   LIGATION ARTERIOVENOUS GORTEX GRAFT Left 08/27/2018   Procedure: LIGATION ARTERIOVENOUS GORTEX GRAFT ARM;  Surgeon: Angelia Mould, MD;  Location: [place];  Service: Vascular;  Laterality: Left;   LIGATION OF ARTERIOVENOUS  FISTULA Left 03/20/2015   Procedure: LIGATION OF ARTERIOVENOUS  FISTULA;  Surgeon: Angelia Mould, MD;  Location: [place];  Service: Vascular;  Laterality: Left;   MASTECTOMY Bilateral ~1982   bil breast reconstruction with implants   ORIF TIBIA & FIBULA FRACTURES Left 2007   also had left non displaced malleolar fracture.    PERIPHERAL VASCULAR BALLOON ANGIOPLASTY Left 01/19/2018   Procedure: PERIPHERAL VASCULAR BALLOON ANGIOPLASTY;  Surgeon: Serafina Mitchell, MD;  Location: [place] [facility];  Service: Cardiovascular;  Laterality: Left;  fistula   PERIPHERAL VASCULAR BALLOON ANGIOPLASTY Left 03/19/2018   Procedure: PERIPHERAL VASCULAR BALLOON ANGIOPLASTY;  Surgeon: Angelia Mould, MD;  Location: [place] [facility];  Service: Cardiovascular;  Laterality: Left;  UPPER ARM FISTULA   PERIPHERAL VASCULAR BALLOON ANGIOPLASTY Right 12/24/2018   Procedure: PERIPHERAL VASCULAR BALLOON ANGIOPLASTY;  Surgeon: Angelia Mould, MD;  Location: [place] [facility];  Service: Cardiovascular;  Laterality: Right;  arm fistula   PERIPHERAL VASCULAR BALLOON ANGIOPLASTY  03/29/2019   Procedure: PERIPHERAL VASCULAR BALLOON ANGIOPLASTY;  Surgeon: Angelia Mould, MD;  Location: [place] [facility];  Service: Cardiovascular;;   PERIPHERAL VASCULAR BALLOON ANGIOPLASTY Right 02/03/2020   Procedure: PERIPHERAL VASCULAR BALLOON ANGIOPLASTY;  Surgeon: Angelia Mould, MD;  Location: [place] [facility];  Service: Cardiovascular;  Laterality: Right;  upper arm fistula   PERIPHERAL VASCULAR BALLOON ANGIOPLASTY  06/22/2020   Procedure: PERIPHERAL VASCULAR BALLOON  ANGIOPLASTY;  Surgeon: Angelia Mould, MD;  Location: [place] [facility];  Service: Cardiovascular;;  Right AVF   REMOVAL OF GRAFT Left 04/12/2015   Procedure: REMOVAL OF LEFT ARM ARTERIOVENOUS GORE-TEX GRAFT;  Surgeon: Angelia Mould, MD;  Location: [place];  Service: Vascular;  Laterality: Left;   REVISION OF ARTERIOVENOUS GORETEX GRAFT Right 12/13/2019   Procedure: REVISION OF ARTERIOVENOUS GORETEX GRAFT RIGHT FOREARM;  Surgeon: Angelia Mould, MD;  Location: [facility] OR;  Service: Vascular;  Laterality: Right;     Medications Prior to Admission: Prior to Admission medications   Medication Sig Start Date End Date Taking? Authorizing Provider  acetaminophen (TYLENOL) 325 MG tablet Take 650 mg by mouth every 6 (six) hours as needed for fever.    Yes [provider]  allopurinol (ZYLOPRIM) 100 MG tablet Take 100 mg by mouth daily. (0900)   Yes [provider]  aspirin 81 MG chewable tablet Chew 81 mg by mouth daily. (0900)   Yes [provider]  B Complex-C-Folic Acid (NEPHRO VITAMINS) 0.8 MG TABS Take 1 tablet by mouth daily. (0900)   Yes [provider]  Brinzolamide-Brimonidine (SIMBRINZA) 1-0.2 % SUSP Place 1 drop into both eyes 2 (two) times daily. (0900 & 2100)   Yes [provider]  calcium carbonate (TUMS - DOSED IN MG ELEMENTAL CALCIUM) 500 MG chewable tablet Chew 1 tablet by mouth 2 (two) times daily.   Yes [provider]  cetirizine (ZYRTEC) 10 MG tablet Take 10 mg by mouth daily.   Yes [provider]  cholestyramine (QUESTRAN) 4 g packet Take 1 packet (4 g total) by mouth 2 (two) times daily. Take twice daily before breakfast and dinner Patient taking differently: Take 4 g by mouth 2 (two) times daily as needed (removal of bile acids.).  10/13/19  Yes Lemmon, Lavone Nian, PA  diazepam (VALIUM) 5 MG tablet Take 1 tablet (5 mg total) by mouth at bedtime. Patient taking differently: Take 5 mg by mouth at  bedtime. (2200) 07/08/18  Yes Arrien, Jimmy Picket, MD  diclofenac Sodium (VOLTAREN) 1 % GEL Apply 2 g topically at bedtime. (2100) applied to right knee for pain. 06/04/20  Yes [provider]  DIMETHICONE, TOPICAL, 5 % CREA Apply 1 application topically See admin instructions. Place a nickel-sized amount onto buttock(s) area 2 times a day for barrier protection   Yes [provider]  fluticasone (FLONASE) 50 MCG/ACT nasal spray Place 2 sprays into both nostrils daily. (0800)   Yes [provider]  loperamide (IMODIUM A-D) 2 MG tablet Take 2 tablets (4 mg total) by mouth every 8 (eight) hours as needed for diarrhea or loose stools (for diarrhea). Patient taking differently: Take 4 mg by mouth every 8 (eight) hours as needed for diarrhea or loose stools.  03/01/19  Yes Pyrtle, Lajuan Lines, MD  Melatonin 5 MG CAPS Take 10 mg by mouth at bedtime. (2100)   Yes [provider]  Menthol, Topical Analgesic, (BIOFREEZE) 4 % GEL Apply 1 application topically 3 (three) times daily as needed (bilateral knee pain).   Yes [provider]  metoprolol tartrate (LOPRESSOR) 25 MG tablet Take 25 mg by mouth daily. (0900) Hold if hr <60 or SBP>105   Yes [provider]  midodrine (PROAMATINE) 2.5 MG tablet Take 2.5 mg by mouth 2 (two) times daily. (1600 & 2200) Give if bp is < 120/60   Yes [provider]  SALINE NASAL MIST NA Place 2 sprays into the nose 3 (three) times daily.   Yes [provider]  traZODone (DESYREL) 50 MG tablet Take 50 mg by mouth at bedtime.    Yes [provider]  OXYGEN Inhale 2 L into the lungs continuous.     [provider]     Allergies:    Allergies  Allergen Reactions   Erythromycin Swelling   Codeine Palpitations, Rash and Other (See Comments)    GI Upset   Erythromycin Base Other (See Comments)    GI Upset   Penicillins Rash and Other (See Comments)    Has taken Keflex & Rocephin many times  without problems Documented on MAR   Zantac [Ranitidine Hcl] Nausea And Vomiting    Social History:   Social History   Socioeconomic History   Marital status: Divorced    Spouse name: Not on file   Number of children:  Not on file   Years of education: Not on file   Highest education level: Not on file  Occupational History   Not on file  Tobacco Use   Smoking status: Never Smoker   Smokeless tobacco: Never Used  Vaping Use   Vaping Use: Never used  Substance and Sexual Activity   Alcohol use: No   Drug use: No   Sexual activity: Not on file  Other Topics Concern   Not on file  Social History Narrative   [facility] (10/06/17):   Originally from [facility] Has always lived in [place]. She currently lives in [place]. No bird exposure. [place] Previously traveled to [place], [place], & [place]. Previously worked in a nursery and also a Pharmacist, community.    Social Determinants of Health   Financial Resource Strain:    Difficulty of Paying Living Expenses:   Food Insecurity:    Worried About Charity fundraiser in the Last Year:    Arboriculturist in the Last Year:   Transportation Needs:    Film/video editor (Medical):    Lack of Transportation (Non-Medical):   Physical Activity:    Days of Exercise per Week:    Minutes of Exercise per Session:   Stress:    Feeling of Stress :   Social Connections:    Frequency of Communication with Friends and Family:    Frequency of Social Gatherings with Friends and Family:    Attends Religious Services:    Active Member of Clubs or Organizations:    Attends Music therapist:    Marital Status:   Intimate Partner Violence:    Fear of Current or Ex-Partner:    Emotionally Abused:    Physically Abused:    Sexually Abused:     Family History:   The patient's family history includes Diabetes in her mother; Heart attack in her father; Heart disease in her brother and mother; Lung  disease in her brother; Multiple myeloma in her sister; Non-Hodgkin's lymphoma in her mother; Parkinson's disease in her brother; Stroke in her father.    ROS:  Please see the history of present illness.  All other ROS reviewed and negative.     Physical Exam/Data:   Vitals:   08/01/20 1042 08/01/20 1115 08/01/20 1145 08/01/20 1200  BP: (!) 162/62  (!) 148/62 (!) 157/66  Pulse: 71 70 69 72  Resp: _0 (!) 21  Temp: 97.7 F (36.5 C)     TempSrc: Oral     SpO2: 100% 100% 100% 100%   No intake or output data in the 24 hours ending 08/01/20 1306 Last 3 Weights 06/22/2020 06/14/2020 04/19/2020  Weight (lbs) 124 lb 124 lb 5.4 oz 126 lb  Weight (kg) 56.246 kg 56.4 kg 57.153 kg     There is no height or weight on file to calculate BMI.  General:  Well nourished, well developed, in no acute distress HEENT: normal Lymph: no adenopathy Neck: no JVD Endocrine:  No thryomegaly Vascular: No carotid bruits; FA pulses 2+ bilaterally without bruits  Cardiac:  normal S1, S2; RRR; + murmur  Lungs:  clear to auscultation bilaterally, no wheezing, rhonchi or rales  Abd: soft, nontender, no hepatomegaly  Ext: no edema Musculoskeletal:  No deformities, BUE and BLE strength normal and equal Skin: warm and dry  Neuro:  CNs 2-12 intact, no focal abnormalities noted Psych:  Normal affect    EKG:  The ECG  that was done 08/01/20 was personally reviewed and demonstrates NSR, 71 bpm, nonspecific ST/T wave changes  Relevant CV Studies:  Echo 2019 Study Conclusions   - Left ventricle: The cavity size was normal. Wall thickness was  increased in a pattern of mild LVH. Systolic function was  vigorous. The estimated ejection fraction was in the range of 65%  to 70%. Wall motion was normal; there were no regional wall  motion abnormalities. The study is not technically sufficient to  allow evaluation of LV diastolic function.  - Aortic valve: There was trivial regurgitation.  - Mitral  valve: Calcified annulus.  - Left atrium: The atrium was severely dilated.  - Pulmonary arteries: Systolic pressure was mildly increased. PA  peak pressure: 44 mm Hg (S).   Laboratory Data:  High Sensitivity Troponin:   Recent Labs  Lab 08/01/20 1052  TROPONINIHS 26*      Chemistry Recent Labs  Lab 08/01/20 1052 08/01/20 1115  NA 140 140  K 3.2* 3.1*  CL 98 97*  CO2 29  --   GLUCOSE 106* 100*  BUN 22 24*  CREATININE 3.63* 3.40*  CALCIUM 8.3*  --   GFRNONAA 11*  --   GFRAA 13*  --   ANIONGAP 13  --     Recent Labs  Lab 08/01/20 1052  PROT 6.9  ALBUMIN 2.7*  AST 24  ALT 13  ALKPHOS 81  BILITOT 0.8   Hematology Recent Labs  Lab 08/01/20 1052 08/01/20 1115  WBC 6.5  --   RBC 3.45*  --   HGB 11.1* 12.9  HCT 37.3 38.0  MCV 108.1*  --   MCH 32.2  --   MCHC 29.8*  --   RDW 15.1  --   PLT 177  --    BNPNo results for input(s): BNP, PROBNP in the last 168 hours.  DDimer No results for input(s): DDIMER in the last 168 hours.   Radiology/Studies:  DG Chest Port 1 View  Result Date: 08/01/2020 CLINICAL DATA:  Short of breath.  Palpitations. EXAM: PORTABLE CHEST 1 VIEW COMPARISON:  05/16/2019 FINDINGS: Midline trachea. Borderline cardiomegaly. Atherosclerosis in the transverse aorta. Small layering left pleural effusion is similar. No pneumothorax. Suspect trace right pleural effusion. Mild pulmonary venous congestion is new or increased. Similar left base airspace disease. IMPRESSION: Mild pulmonary venous congestion, new or increased. Otherwise, similar appearance of left greater than right pleural effusions and left base airspace disease. Electronically Signed   By: Abigail Miyamoto M.D.   On: 08/01/2020 11:56    Assessment and Plan:   Palpitations/paroxysmal Afib  - patient presents with increasing frequency of palpitations with chest pain and sob. EKG with sinus rhythm. Tele shows paroxysmal afib with rates up to 120-140s. During my interview she was in sinus,  rates in the 70s with no chest pain.  - In the past patient became hypotensive with metoprolol and amiodarone. She is currently on metoprolol 25 mg daily.  - She has a history of junctional bradycardia as well, not deemed a PPM candidate.  - Deemed poor candidate for anticoagulation due to GIB. CHADSVASC = 5 (age x 2, female, DM2, PAD) - Potassium 3.1, supplement. Will check mag and TSH - Echo in 2019 showed normal EF with severely dilated LA - She last saw EP in 2019. This is a complicated situation given history of bradycardia and hypotension. Can consider re-trial of amio or a medication PRN for high heart rates. Can also consider EP evaluation. MD to  see  Chest pain/Aortic atherosclerosis - has evidence of PAD as well as coronary calcification on chest CT - Normal nuclear study in 2015 - Hs troponin 26. Can continue to trend - EKG without ischemic changes - do not suspect we would pursue further ischemic work-up at this time, especially given patient's sedentary lifestyle. Likely related to the afib RVR.   SSS - history of SSS with intermittent bradycardia and has been evaluated by 2 EP specialists in the past and deemed not a PPM candidate  ESRD on HD - MWF - has been experiencing more frequent palpitations during HD   For questions or updates, please contact [facility] Please consult [email or web address] for contact info under     Signed, Cadence Ninfa Meeker, PA-C  08/01/2020 1:06 PM   Atrial fibrillation-paroxysmal  Chest pain associated with the above  Bradycardia/junctional rhythm in the past  End-stage renal disease on hemodialysis  Thromboembolic risk factors ( age  -2, HTN-1,DM-1, Vascular disease-1 Gender-1) for a CHADSVASc Score of >=5  Pulmonary edema on chest x-ray  Oxygen dependent COPD  Hypotension with dialysis  Megaloblastostic anemia  The patient has a history of increasing frequency of atrial fibrillation in the context of significant left atrial and  marked and hypotension.  The latter precludes easy rate control.  The former makes recurrent atrial fibrillation much more likely.  In the past, she developed bradycardia with joint therapy of amiodarone and metoprolol.  Options for controlling her rate would include AV ablation and pacing which I would be reluctant to pursue given her ongoing dialysis, amiodarone for rate and rhythm control, digoxin.  Beta-blockers and calcium blockers at higher doses if she is currently taking would presumably be precluded given the issues that she has had with hypotension and dialysis and the frequency of her events certainly seems to preclude the use of infrequent as needed rate control  Given this, we have discussed with the family the use of amiodarone with the discontinuation of her metoprolol.  We will start her 200 mg twice daily.  Her pulmonary edema on chest x-ray may be related to atrial fibrillation burden.  You will need to be addressed through dialysis.  As well her hypokalemia.  In the context of her end-stage renal disease, and not related to her history of GI bleeding, I think it is reasonable that she not be anticoagulated.  In this cohort of patients, prior stroke seems to be the major variable driving a decision in favor of anticoagulation  Liver function tests are normal at baseline for amiodarone.  TSH is pending  Her complaints of fatigue I think are not likely related to her atrial fibrillation although the burden of her atrial fibrillation paroxysms may be sufficiently high that there could be a cumulative effect.  I would consider other explanations  Her medical elastosis is impressive.  In 2018 her MCV was 120.  It now is about 108.  Reviewed from up-to-date raising the concern of bone marrow processes.  Will defer this to primary service.

## 2020-08-01 NOTE — ED Notes (Signed)
Pt heart rate more controlled at 90-120's after lopressor. Pt tolerated well. Will continue to monitor. Provider notified.

## 2020-08-01 NOTE — ED Provider Notes (Signed)
  Physical Exam  BP (!) 150/63   Pulse 70   Temp 97.7 F (36.5 C) (Oral)   Resp (!) 22   SpO2 100%   Physical Exam  ED Course/Procedures     Procedures  MDM  Care assumed at 3 PM.  Patient is here with rapid A. fib.  Patient has a history of A. Fib  but is not a anticoagulation candidate.  Signout pending second troponin and BNP as well as cardiology consult.  4:43 PM Patient's BNP is 1000 and her second troponin is 30 which is stable from 26.  I discussed case with cardiology and reviewed Dr. Olin Pia note.  He plans to switch patient back to amiodarone 200 mg twice daily and stop metoprolol.  Patient has elevated BNP and mild edema on chest x-ray but she is a dialysis patient.  She did not get dialyzed today and may need to take off extra fluid either tomorrow or Friday.  Stable for discharge at this point.  Gave strict return precautions.      Drenda Freeze, MD 08/01/20 [phone]

## 2020-08-01 NOTE — ED Notes (Signed)
IV removed, per [facility] - RN.

## 2020-08-01 NOTE — ED Provider Notes (Signed)
[facility] Emergency Department Provider Note MRN:  [id number]  Arrival date & time: 08/01/20     Chief Complaint   Palpitations   History of Present Illness   [name] James is a 84 y.o. year-old female with a history of A. fib, CHF, ESRD presenting to the ED with chief complaint of palpitation.  Sudden onset palpitations this morning at care facility, resolved in route with EMS.  During the palpitations patient was experiencing chest pain and shortness of breath, now resolved.  Due for dialysis this morning.  Denies headache or vision change, no abdominal pain, currently without complaints other than feeling tired.  Review of Systems  A complete 10 system review of systems was obtained and all systems are negative except as noted in the HPI and PMH.   Patient's Health History    Past Medical History:  Diagnosis Date  . Anemia, chronic disease   . Anxiety   . Arthritis    knees  . Atrial fibrillation ([place])   . Barrett esophagus   . Benign paroxysmal positional vertigo   . Breast cancer ([place]) 1980s   bil mastectomies, no radiation or chemo  . Cataract   . Chest pain, atypical 12/05/2008   R/Lmv- normal perfusion all regions, noe ECG changes   . CHF (congestive heart failure) ([place]) 05/13/2011   echo - OY>77%; stage 1 diastolic dysfunction; elevated LV filling pressure, MAC  . CKD (chronic kidney disease), stage III   . Claudication ([place]) 10/30/2005   doppler - normal evaluation, no evidence of aneurysm, diameter reduction, dissection, compression or vascular abnormality)  . Complication of anesthesia   . Controlled type 2 diabetes mellitus with diabetic polyneuropathy, with long-term current use of insulin ([place]) 04/17/2016  . Dyslipidemia   . Dysrhythmia    Atrial Fibrillation  . Edema   . ESRD (end stage renal disease) on dialysis ([place])    "[facility]; [address]; MWF" (07/06/2018)  . Family history of adverse reaction to anesthesia    son also has  nausea   . Fever blister 12/2016  . Gastritis and gastroduodenitis   . GERD (gastroesophageal reflux disease)   . Gout   . Hemodialysis-associated hypotension   . HLD (hyperlipidemia)   . Hypertension   . IBS (irritable bowel syndrome)   . Insomnia   . Iron deficiency anemia   . Junctional bradycardia 08/25/2018  . LFT elevation   . Long term current use of amiodarone 10/17/2016  . Morbid (severe) obesity due to excess calories ([place]) 04/29/2017  . Morbid obesity ([place])   . On home oxygen therapy    "2L; 24/7" (07/06/2018)  . PAF (paroxysmal atrial fibrillation) ([place])   . Peripheral neuropathy   . Pneumonia 12/2016  . PONV (postoperative nausea and vomiting)   . Renal cyst   . Renal insufficiency 10/30/2005   doppler - abn resistance consistent w/ parenchymal disease  . Renovascular hypertension   . Type 2 diabetes mellitus with diabetic neuropathy, with long-term current use of insulin ([place])   . Vertigo     Past Surgical History:  Procedure Laterality Date  . A/V FISTULAGRAM Left 03/19/2018   Procedure: A/V FISTULAGRAM;  Surgeon: Angelia Mould, MD;  Location: [place] [facility];  Service: Cardiovascular;  Laterality: Left;  . A/V FISTULAGRAM N/A 12/24/2018   Procedure: A/V FISTULAGRAM - Right Arm;  Surgeon: Angelia Mould, MD;  Location: [place] [facility];  Service: Cardiovascular;  Laterality: N/A;  . A/V FISTULAGRAM Right 03/29/2019  Procedure: A/V FISTULAGRAM;  Surgeon: Angelia Mould, MD;  Location: [place] [facility];  Service: Cardiovascular;  Laterality: Right;  . A/V FISTULAGRAM Right 02/03/2020   Procedure: A/V FISTULAGRAM;  Surgeon: Angelia Mould, MD;  Location: [place] [facility];  Service: Cardiovascular;  Laterality: Right;  . A/V SHUNTOGRAM Left 01/19/2018   Procedure: A/V SHUNTOGRAM;  Surgeon: Serafina Mitchell, MD;  Location: [place] [facility];  Service: Cardiovascular;  Laterality: Left;  . ANGIOPLASTY Left 02/12/2015   Procedure:  ANGIOPLASTY;  Surgeon: Angelia Mould, MD;  Location: [facility] CATH LAB;  Service: Cardiovascular;  Laterality: Left;  AVF  . AV FISTULA PLACEMENT Left 11/30/2014   Procedure: ARTERIOVENOUS (AV) FISTULA CREATION LEFT ARM;  Surgeon: Angelia Mould, MD;  Location: [place];  Service: Vascular;  Laterality: Left;  . AV FISTULA PLACEMENT Left 03/20/2015   Procedure:  Inserton of Left Upper Arm Gortex Graft;  Surgeon: Angelia Mould, MD;  Location: [place];  Service: Vascular;  Laterality: Left;  . AV FISTULA PLACEMENT Left 04/12/2015   Procedure: INSERTION OF LEFT ARM  ARTERIOVENOUS GORE-TEX GRAFT ;  Surgeon: Angelia Mould, MD;  Location: [place];  Service: Vascular;  Laterality: Left;  . AV FISTULA PLACEMENT Right 06/08/2018   Procedure: INSERTION OF ARTERIOVENOUS (AV) GORE-TEX GRAFT ARM USING 4-7MM X 45CM GORETEX GRAFT;  Surgeon: Angelia Mould, MD;  Location: [place];  Service: Vascular;  Laterality: Right;  . BACK SURGERY  ~1980  . CATARACT EXTRACTION Right   . CHOLECYSTECTOMY    . ESOPHAGOGASTRODUODENOSCOPY N/A 10/24/2014   Procedure: ESOPHAGOGASTRODUODENOSCOPY (EGD);  Surgeon: Jerene Bears, MD;  Location: [facility] ENDOSCOPY;  Service: Endoscopy;  Laterality: N/A;  . EXCHANGE OF A DIALYSIS CATHETER Left 11/30/2014   Procedure: EXCHANGE OF A DIALYSIS CATHETER, LEFT INTERNAL JUGULAR;  Surgeon: Angelia Mould, MD;  Location: [place];  Service: Vascular;  Laterality: Left;  . EYE SURGERY Left    transplant  . FISTULOGRAM N/A 02/12/2015   Procedure: FISTULOGRAM;  Surgeon: Angelia Mould, MD;  Location: [facility] CATH LAB;  Service: Cardiovascular;  Laterality: N/A;  . HEMORRHOID SURGERY    . IR AV DIALY SHUNT INTRO NEEDLE/INTRACATH INITIAL W/PTA/IMG RIGHT Right 08/25/2018  . IR AV DIALY SHUNT INTRO NEEDLE/INTRACATH INITIAL W/PTA/IMG RIGHT Right 11/17/2018  . IR US GUIDE VASC ACCESS RIGHT  08/25/2018  . LIGATION ARTERIOVENOUS GORTEX GRAFT Left 08/27/2018   Procedure: LIGATION  ARTERIOVENOUS GORTEX GRAFT ARM;  Surgeon: Angelia Mould, MD;  Location: [place];  Service: Vascular;  Laterality: Left;  . LIGATION OF ARTERIOVENOUS  FISTULA Left 03/20/2015   Procedure: LIGATION OF ARTERIOVENOUS  FISTULA;  Surgeon: Angelia Mould, MD;  Location: [place];  Service: Vascular;  Laterality: Left;  Marland Kitchen MASTECTOMY Bilateral ~1982   bil breast reconstruction with implants  . ORIF TIBIA & FIBULA FRACTURES Left 2007   also had left non displaced malleolar fracture.   Marland Kitchen PERIPHERAL VASCULAR BALLOON ANGIOPLASTY Left 01/19/2018   Procedure: PERIPHERAL VASCULAR BALLOON ANGIOPLASTY;  Surgeon: Serafina Mitchell, MD;  Location: [place] [facility];  Service: Cardiovascular;  Laterality: Left;  fistula  . PERIPHERAL VASCULAR BALLOON ANGIOPLASTY Left 03/19/2018   Procedure: PERIPHERAL VASCULAR BALLOON ANGIOPLASTY;  Surgeon: Angelia Mould, MD;  Location: [place] [facility];  Service: Cardiovascular;  Laterality: Left;  UPPER ARM FISTULA  . PERIPHERAL VASCULAR BALLOON ANGIOPLASTY Right 12/24/2018   Procedure: PERIPHERAL VASCULAR BALLOON ANGIOPLASTY;  Surgeon: Angelia Mould, MD;  Location: [place] [facility];  Service: Cardiovascular;  Laterality: Right;  arm fistula  . PERIPHERAL VASCULAR BALLOON ANGIOPLASTY  03/29/2019   Procedure: PERIPHERAL VASCULAR BALLOON ANGIOPLASTY;  Surgeon: Angelia Mould, MD;  Location: [place] [facility];  Service: Cardiovascular;;  . PERIPHERAL VASCULAR BALLOON ANGIOPLASTY Right 02/03/2020   Procedure: PERIPHERAL VASCULAR BALLOON ANGIOPLASTY;  Surgeon: Angelia Mould, MD;  Location: [place] [facility];  Service: Cardiovascular;  Laterality: Right;  upper arm fistula  . PERIPHERAL VASCULAR BALLOON ANGIOPLASTY  06/22/2020   Procedure: PERIPHERAL VASCULAR BALLOON ANGIOPLASTY;  Surgeon: Angelia Mould, MD;  Location: [place] [facility];  Service: Cardiovascular;;  Right AVF  . REMOVAL OF GRAFT Left 04/12/2015   Procedure: REMOVAL OF  LEFT ARM ARTERIOVENOUS GORE-TEX GRAFT;  Surgeon: Angelia Mould, MD;  Location: [address];  Service: Vascular;  Laterality: Left;  . REVISION OF ARTERIOVENOUS GORETEX GRAFT Right 12/13/2019   Procedure: REVISION OF ARTERIOVENOUS GORETEX GRAFT RIGHT FOREARM;  Surgeon: Angelia Mould, MD;  Location: [facility] OR;  Service: Vascular;  Laterality: Right;    Family History  Problem Relation Age of Onset  . Diabetes Mother   . Heart disease Mother   . Non-Hodgkin's lymphoma Mother   . Heart attack Father   . Stroke Father   . Parkinson's disease Brother   . Lung disease Brother   . Heart disease Brother   . Multiple myeloma Sister     Social History   Socioeconomic History  . Marital status: Divorced    Spouse name: Not on file  . Number of children: Not on file  . Years of education: Not on file  . Highest education level: Not on file  Occupational History  . Not on file  Tobacco Use  . Smoking status: Never Smoker  . Smokeless tobacco: Never Used  Vaping Use  . Vaping Use: Never used  Substance and Sexual Activity  . Alcohol use: No  . Drug use: No  . Sexual activity: Not on file  Other Topics Concern  . Not on file  Social History Narrative   [facility] (10/06/17):   Originally from [place]. Has always lived in [place]. She currently lives in [place]. No bird exposure. [place]. Previously traveled to [place], [place], & [place]. Previously worked in a nursery and also a Pharmacist, community.    Social Determinants of Health   Financial Resource Strain:   . Difficulty of Paying Living Expenses:   Food Insecurity:   . Worried About Charity fundraiser in the Last Year:   . Arboriculturist in the Last Year:   Transportation Needs:   . Film/video editor (Medical):   Marland Kitchen Lack of Transportation (Non-Medical):   Physical Activity:   . Days of Exercise per Week:   . Minutes of Exercise per Session:   Stress:   . Feeling of Stress :   Social Connections:   . Frequency  of Communication with Friends and Family:   . Frequency of Social Gatherings with Friends and Family:   . Attends Religious Services:   . Active Member of Clubs or Organizations:   . Attends Archivist Meetings:   Marland Kitchen Marital Status:   Intimate Partner Violence:   . Fear of Current or Ex-Partner:   . Emotionally Abused:   Marland Kitchen Physically Abused:   . Sexually Abused:      Physical Exam   Vitals:   08/01/20 1345 08/01/20 1430  BP: (!) 160/90 (!) 159/61  Pulse: 72 72  Resp: (!) 23 (!) 26  Temp:    SpO2: 100% 100%    CONSTITUTIONAL: Chronically ill-appearing, NAD NEURO:  Alert and oriented x 3, no focal deficits EYES:  eyes equal and reactive ENT/NECK:  no LAD, no JVD CARDIO: Regular rate, well-perfused, normal S1 and S2 PULM:  CTAB no wheezing or rhonchi GI/GU:  normal bowel sounds, non-distended, non-tender MSK/SPINE:  No gross deformities, no edema SKIN:  no rash, atraumatic PSYCH:  Appropriate speech and behavior  *Additional and/or pertinent findings included in MDM below  Diagnostic and Interventional Summary    EKG Interpretation  Date/Time:  Wednesday August 01 2020 10:42:01 EDT Ventricular Rate:  71 PR Interval:    QRS Duration: 101 QT Interval:  432 QTC Calculation: 470 R Axis:   59 Text Interpretation: Sinus rhythm Confirmed by Gerlene Fee [phone]) on 08/01/2020 11:34:40 AM       EKG Interpretation  Date/Time:  Wednesday August 01 2020 12:50:51 EDT Ventricular Rate:  139 PR Interval:    QRS Duration: 94 QT Interval:  293 QTC Calculation: 446 R Axis:   62 Text Interpretation: Atrial fibrillation with rapid V-rate Repolarization abnormality, prob rate related Confirmed by Gerlene Fee [phone]) on 08/01/2020 2:09:00 PM       Labs Reviewed  CBC WITH DIFFERENTIAL/PLATELET - Abnormal; Notable for the following components:      Result Value   RBC 3.45 (*)    Hemoglobin 11.1 (*)    MCV 108.1 (*)    MCHC 29.8 (*)    All other components within  normal limits  COMPREHENSIVE METABOLIC PANEL - Abnormal; Notable for the following components:   Potassium 3.2 (*)    Glucose, Bld 106 (*)    Creatinine, Ser 3.63 (*)    Calcium 8.3 (*)    Albumin 2.7 (*)    GFR calc non Af Amer 11 (*)    GFR calc Af Amer 13 (*)    All other components within normal limits  I-STAT CHEM 8, ED - Abnormal; Notable for the following components:   Potassium 3.1 (*)    Chloride 97 (*)    BUN 24 (*)    Creatinine, Ser 3.40 (*)    Glucose, Bld 100 (*)    Calcium, Ion 1.09 (*)    All other components within normal limits  TROPONIN I (HIGH SENSITIVITY) - Abnormal; Notable for the following components:   Troponin I (High Sensitivity) 26 (*)    All other components within normal limits  BRAIN NATRIURETIC PEPTIDE  TROPONIN I (HIGH SENSITIVITY)    DG Chest Port 1 View  Final Result      Medications  sodium chloride 0.9 % bolus 500 mL (500 mLs Intravenous New Bag/Given 08/01/20 1303)  metoprolol tartrate (LOPRESSOR) injection 5 mg (5 mg Intravenous Given 08/01/20 1303)     Procedures  /  Critical Care .Critical Care Performed by: Maudie Flakes, MD Authorized by: Maudie Flakes, MD   Critical care provider statement:    Critical care time (minutes):  45   Critical care was necessary to treat or prevent imminent or life-threatening deterioration of the following conditions: A. fib with RVR.   Critical care was time spent personally by me on the following activities:  Discussions with consultants, evaluation of patient's response to treatment, examination of patient, ordering and performing treatments and interventions, ordering and review of laboratory studies, ordering and review of radiographic studies, pulse oximetry, re-evaluation of patient's condition, obtaining history from patient  or surrogate and review of old charts .1-3 Lead EKG Interpretation Performed by: Maudie Flakes, MD Authorized by: Maudie Flakes, MD     Interpretation: abnormal       ECG rate:  140   ECG rate assessment: tachycardic     Rhythm: atrial fibrillation   Comments:     With RVR    ED Course and Medical Decision Making  I have reviewed the triage vital signs, the nursing notes, and pertinent available records from the EMR.  Listed above are laboratory and imaging tests that I personally ordered, reviewed, and interpreted and then considered in my medical decision making (see below for details).      Suspect A. fib with RVR given patient's history of paroxysmal A. fib, recent ED visit for A. fib with RVR.  Patient is now with resolved palpitations, in sinus rhythm, in no acute distress.  Doubt PE, still considering ACS but felt to be less likely.  Awaiting laboratory evaluation.  Upon reassessment patient is with heart rate of 140 on the monitor, rhythm consistent with A. fib and RVR.  Confirmed with twelve-lead EKG.  Patient is feeling these palpitations once again, largely confirming the suspicion that this is what caused her symptoms earlier today.  Given her long history of paroxysmal A. fib and not being anticoagulated, I find her to be a poor cardioversion candidate at this time.  Will consult cardiology for recommendations.  Will provide small bolus of fluid as well as small dose metoprolol.  Metoprolol has improved rates down to 90s, still awaiting second troponin and cardiology recommendations.  Signed out to oncoming provider at shift change.  Barth Kirks. [place] Small, [place] [email or web address]  Final Clinical Impressions(s) / ED Diagnoses     ICD-10-CM   1. Atrial fibrillation with rapid ventricular response (HCC)  I48.91   2. Palpitations  R00.2     ED Discharge Orders    None       Discharge Instructions Discussed with and Provided to Patient:   Discharge Instructions   None       Maudie Flakes, MD 08/01/20 1452

## 2020-08-02 ENCOUNTER — Ambulatory Visit (INDEPENDENT_AMBULATORY_CARE_PROVIDER_SITE_OTHER): Payer: Medicare Other | Admitting: Pulmonary Disease

## 2020-08-02 ENCOUNTER — Other Ambulatory Visit: Payer: Self-pay

## 2020-08-02 ENCOUNTER — Encounter: Payer: Self-pay | Admitting: Pulmonary Disease

## 2020-08-02 VITALS — BP 116/64 | HR 75 | Temp 95.8°F

## 2020-08-02 DIAGNOSIS — R0981 Nasal congestion: Secondary | ICD-10-CM

## 2020-08-02 MED ORDER — IPRATROPIUM BROMIDE 0.03 % NA SOLN: 2.0000 | Freq: Two times a day (BID) | NASAL | 12 refills | Status: AC

## 2020-08-02 NOTE — Patient Instructions (Signed)
Nice to meet you!  Use the azelastine spray for the next 5 days then stop. Then start and continue the ipratropium (generic for atrovent) nasal spray for the runny nose and dripping down the back of the throat. I hope this helps your cough!  Continue the anti-histamine and Flonase.  We will see you back in 3 months to see how the cough is doing.

## 2020-08-02 NOTE — Progress Notes (Signed)
Patient ID: [name] James, female    DOB: [birth year], 84 y.o.   MRN: [id number]  Chief Complaint  Patient presents with  . Consult    former [facility] patient re-establishing care with clinic.      Referring provider: [facility] [place]  HPI:   [name] James is a 84 y.o. woman wh ESRD on HD with evidence of volume overload on chest imaging and chronic trapped left lower lobe whom we are seeing in consultation at the request of [place] for evaluation of cough.   Cough present for years. Maybe a bit worse as of late. Often productive. Not worse with eating or drinking. No GERD. Notes sensation of phlegm dripping down throat. Some environments worse than other. She attributes this to dust or other allergens. Flonase and antihistamine on med list from facility.    PMH: ESRD on HD, Paroxysmal Afib w/ RVR Surgical History:Fistual palcement Fam History: diabetes in mom, CAD in dad Social History: Lives at [facility], daughter accompanies her to visit   Questionaires / Pulmonary Flowsheets:   ACT:  No flowsheet data found.  MMRC: No flowsheet data found.  Epworth:  No flowsheet data found.  Tests:   FENO:  No results found for: NITRICOXIDE  PFT: No flowsheet data found.  WALK:  No flowsheet data found.  Imaging: DG Chest Port 1 View  Result Date: 08/01/2020 CLINICAL DATA:  Short of breath.  Palpitations. EXAM: PORTABLE CHEST 1 VIEW COMPARISON:  05/16/2019 FINDINGS: Midline trachea. Borderline cardiomegaly. Atherosclerosis in the transverse aorta. Small layering left pleural effusion is similar. No pneumothorax. Suspect trace right pleural effusion. Mild pulmonary venous congestion is new or increased. Similar left base airspace disease. IMPRESSION: Mild pulmonary venous congestion, new or increased. Otherwise, similar appearance of left greater than right pleural effusions and left base airspace disease. Electronically Signed   By: Abigail Miyamoto M.D.   On: 08/01/2020 11:56    VAS US DUPLEX DIALYSIS ACCESS (AVF, AVG)  Result Date: 07/19/2020 DIALYSIS ACCESS Reason for Exam: Routine follow up. Access Site: Right Upper Extremity. Access Type: Forearm loop AVG. History: 06/22/20 Venoplasty of brachial vein stenosis          06/08/18 AV Gore-tex graft placement. Performing Technologist: June Leap RDMS, RVT  Examination Guidelines: A complete evaluation includes B-mode imaging, spectral Doppler, color Doppler, and power Doppler as needed of all accessible portions of each vessel. Unilateral testing is considered an integral part of a complete examination. Limited examinations for reoccurring indications may be performed as noted.  Findings:   +--------------------+----------+-----------------+--------+ AVG                 PSV (cm/s)Flow Vol (mL/min)Describe +--------------------+----------+-----------------+--------+ Native artery inflow   236          1546                +--------------------+----------+-----------------+--------+ Arterial anastomosis   548                              +--------------------+----------+-----------------+--------+ Prox graft             434                              +--------------------+----------+-----------------+--------+ Mid graft              146                              +--------------------+----------+-----------------+--------+  Distal graft           133                              +--------------------+----------+-----------------+--------+ Venous anastomosis     278                              +--------------------+----------+-----------------+--------+ Venous outflow         210                              +--------------------+----------+-----------------+--------+  Summary: Patent arteriovenous graft. *See table(s) above for measurements and observations.  Diagnosing physician: Ruta Hinds MD Electronically signed by Ruta Hinds MD on 07/19/2020 at 12:40:24 PM.    --------------------------------------------------------------------------------   Final     Lab Results:  CBC    Component Value Date/Time   WBC 6.5 08/01/2020 1052   RBC 3.45 (L) 08/01/2020 1052   HGB 12.9 08/01/2020 1115   HGB 13.0 09/06/2008 1552   HCT 38.0 08/01/2020 1115   HCT 38.3 09/06/2008 1552   PLT 177 08/01/2020 1052   PLT 188 09/06/2008 1552   MCV 108.1 (H) 08/01/2020 1052   MCV 95.6 04/02/2014 0908   MCV 95.0 09/06/2008 1552   MCH 32.2 08/01/2020 1052   MCHC 29.8 (L) 08/01/2020 1052   RDW 15.1 08/01/2020 1052   RDW 15.3 (H) 09/06/2008 1552   LYMPHSABS 1.1 08/01/2020 1052   LYMPHSABS 4.2 (H) 09/06/2008 1552   MONOABS 0.6 08/01/2020 1052   MONOABS 0.7 09/06/2008 1552   EOSABS 0.2 08/01/2020 1052   EOSABS 0.2 09/06/2008 1552   BASOSABS 0.1 08/01/2020 1052   BASOSABS 0.0 09/06/2008 1552    BMET    Component Value Date/Time   NA 140 08/01/2020 1115   NA 132 (A) 01/06/2017 0000   K 3.1 (L) 08/01/2020 1115   CL 97 (L) 08/01/2020 1115   CO2 29 08/01/2020 1052   GLUCOSE 100 (H) 08/01/2020 1115   BUN 24 (H) 08/01/2020 1115   BUN 22 (A) 01/06/2017 0000   CREATININE 3.40 (H) 08/01/2020 1115   CREATININE 1.86 (H) 04/02/2014 0907   CALCIUM 8.3 (L) 08/01/2020 1052   CALCIUM 8.1 (L) 11/15/2014 0625   GFRNONAA 11 (L) 08/01/2020 1052   GFRAA 13 (L) 08/01/2020 1052    BNP    Component Value Date/Time   BNP 1,092.0 (H) 08/01/2020 1413    ProBNP    Component Value Date/Time   PROBNP 4,493.0 (H) 11/13/2014 0500    Specialty Problems      Pulmonary Problems   Pulmonary edema   Chronic respiratory failure with hypoxia (HCC)    02 dep at 2lpm since Oct 2015       Multiple lung nodules on CT   Pleural effusion, left   Pneumothorax, left    After thoracentesis          Allergies  Allergen Reactions  . Erythromycin Swelling  . Codeine Palpitations, Rash and Other (See Comments)    GI Upset  . Erythromycin Base Other (See Comments)    GI Upset    . Penicillins Rash and Other (See Comments)    Has taken Keflex & Rocephin many times without problems Documented on MAR  . Zantac [Ranitidine Hcl] Nausea And Vomiting    Immunization History  Administered Date(s) Administered  .  Influenza, High Dose Seasonal PF 10/06/2017  . Influenza-Unspecified 09/19/2014  . PFIZER SARS-COV-2 Vaccination 02/20/2020, 03/19/2020  . PPD Test 12/06/2014    Past Medical History:  Diagnosis Date  . Anemia, chronic disease   . Anxiety   . Arthritis    knees  . Atrial fibrillation ([place])   . Barrett esophagus   . Benign paroxysmal positional vertigo   . Breast cancer ([place]) 1980s   bil mastectomies, no radiation or chemo  . Cataract   . Chest pain, atypical 12/05/2008   R/Lmv- normal perfusion all regions, noe ECG changes   . CHF (congestive heart failure) ([place]) 05/13/2011   echo - MV>67%; stage 1 diastolic dysfunction; elevated LV filling pressure, MAC  . CKD (chronic kidney disease), stage III   . Claudication ([place]) 10/30/2005   doppler - normal evaluation, no evidence of aneurysm, diameter reduction, dissection, compression or vascular abnormality)  . Complication of anesthesia   . Controlled type 2 diabetes mellitus with diabetic polyneuropathy, with long-term current use of insulin ([place]) 04/17/2016  . Dyslipidemia   . Dysrhythmia    Atrial Fibrillation  . Edema   . ESRD (end stage renal disease) on dialysis ([place])    "[facility]; [address]; MWF" (07/06/2018)  . Family history of adverse reaction to anesthesia    son also has nausea   . Fever blister 12/2016  . Gastritis and gastroduodenitis   . GERD (gastroesophageal reflux disease)   . Gout   . Hemodialysis-associated hypotension   . HLD (hyperlipidemia)   . Hypertension   . IBS (irritable bowel syndrome)   . Insomnia   . Iron deficiency anemia   . Junctional bradycardia 08/25/2018  . LFT elevation   . Long term current use of amiodarone 10/17/2016  . Morbid (severe) obesity due to  excess calories ([place]) 04/29/2017  . Morbid obesity ([place])   . On home oxygen therapy    "2L; 24/7" (07/06/2018)  . PAF (paroxysmal atrial fibrillation) ([place])   . Peripheral neuropathy   . Pneumonia 12/2016  . PONV (postoperative nausea and vomiting)   . Renal cyst   . Renal insufficiency 10/30/2005   doppler - abn resistance consistent w/ parenchymal disease  . Renovascular hypertension   . Type 2 diabetes mellitus with diabetic neuropathy, with long-term current use of insulin ([place])   . Vertigo     Tobacco History: Social History   Tobacco Use  Smoking Status Never Smoker  Smokeless Tobacco Never Used   Counseling given: Not Answered   Continue to not smoke  Outpatient Encounter Medications as of 08/02/2020  Medication Sig  . acetaminophen (TYLENOL) 325 MG tablet Take 650 mg by mouth every 6 (six) hours as needed for fever.   Marland Kitchen allopurinol (ZYLOPRIM) 100 MG tablet Take 100 mg by mouth daily. (0900)  . amiodarone (PACERONE) 200 MG tablet Take 1 tablet (200 mg total) by mouth 2 (two) times daily.  Marland Kitchen aspirin 81 MG chewable tablet Chew 81 mg by mouth daily. (0900)  . B Complex-C-Folic Acid (NEPHRO VITAMINS) 0.8 MG TABS Take 1 tablet by mouth daily. (0900)  . Brinzolamide-Brimonidine (SIMBRINZA) 1-0.2 % SUSP Place 1 drop into both eyes 2 (two) times daily. (0900 & 2100)  . calcium carbonate (TUMS - DOSED IN MG ELEMENTAL CALCIUM) 500 MG chewable tablet Chew 1 tablet by mouth 2 (two) times daily.  . cetirizine (ZYRTEC) 10 MG tablet Take 10 mg by mouth daily.  . cholestyramine (QUESTRAN) 4 g packet Take 1 packet (4 g total)  by mouth 2 (two) times daily. Take twice daily before breakfast and dinner (Patient taking differently: Take 4 g by mouth 2 (two) times daily as needed (removal of bile acids.). )  . diazepam (VALIUM) 5 MG tablet Take 1 tablet (5 mg total) by mouth at bedtime. (Patient taking differently: Take 5 mg by mouth at bedtime. (2200))  . diclofenac Sodium (VOLTAREN) 1 % GEL  Apply 2 g topically at bedtime. (2100) applied to right knee for pain.  Marland Kitchen DIMETHICONE, TOPICAL, 5 % CREA Apply 1 application topically See admin instructions. Place a nickel-sized amount onto buttock(s) area 2 times a day for barrier protection  . fluticasone (FLONASE) 50 MCG/ACT nasal spray Place 2 sprays into both nostrils daily. (0800)  . loperamide (IMODIUM A-D) 2 MG tablet Take 2 tablets (4 mg total) by mouth every 8 (eight) hours as needed for diarrhea or loose stools (for diarrhea). (Patient taking differently: Take 4 mg by mouth every 8 (eight) hours as needed for diarrhea or loose stools. )  . Melatonin 5 MG CAPS Take 10 mg by mouth at bedtime. (2100)  . Menthol, Topical Analgesic, (BIOFREEZE) 4 % GEL Apply 1 application topically 3 (three) times daily as needed (bilateral knee pain).  . midodrine (PROAMATINE) 2.5 MG tablet Take 2.5 mg by mouth 2 (two) times daily. (1600 & 2200) Give if bp is < 120/60  . OXYGEN Inhale 2 L into the lungs continuous.   Marland Kitchen SALINE NASAL MIST NA Place 2 sprays into the nose 3 (three) times daily.  . traZODone (DESYREL) 50 MG tablet Take 50 mg by mouth at bedtime.   Marland Kitchen ipratropium (ATROVENT) 0.03 % nasal spray Place 2 sprays into both nostrils every 12 (twelve) hours.   No facility-administered encounter medications on file as of 08/02/2020.     Review of Systems  Review of Systems   No chest pain, SOB at baseline. Has sinus pressure and occasional sinus headache. Comprehensive ROS otherwise negative.   Physical Exam  BP 116/64 (BP Location: Left Wrist, Cuff Size: Normal)   Pulse 75   Temp (!) 95.8 F (35.4 C) (Temporal)   SpO2 100%   Wt Readings from Last 5 Encounters:  06/22/20 124 lb (56.2 kg)  06/14/20 124 lb 5.4 oz (56.4 kg)  04/19/20 126 lb (57.2 kg)  04/10/20 126 lb 8 oz (57.4 kg)  02/03/20 129 lb (58.5 kg)    BMI Readings from Last 5 Encounters:  07/19/20 20.63 kg/m  06/22/20 20.63 kg/m  06/14/20 20.69 kg/m  04/19/20 20.97 kg/m    04/10/20 21.05 kg/m     Physical Exam  General: elderly, in NAD Eyes: EOMI, no icterus CV: RRR, early systolic murmur Pulmonary: Diminished sounds bilateral bases, NWOB, using 2L [place]   Assessment & Plan:   Ms. Lewellyn is a 84 y.o. woman wh ESRD on HD with evidence of volume overload on chest imaging and chronic trapped left lower lobe whom we are seeing in consultation at the request of [place] for evaluation of chronic cough.   Cough likely related to nasal congestion. Significant rhinorrhea. Description of symptoms consistent with likely chronic sinusitis. No fever or acute change in symptoms to support suppurative infection at this time. Will continue nasal steroid spray and anti-histamine. Add ipratropium. Will escalate nasal as needed. Consider montelukast in future. Favor avoiding steroids/abx given she is elderly and other significant medical commodities.   Return in about 3 months (around 11/02/2020).   Lanier Clam, MD 08/02/2020

## 2020-08-03 ENCOUNTER — Inpatient Hospital Stay (HOSPITAL_COMMUNITY)
Admission: EM | Admit: 2020-08-03 | Discharge: 2020-08-10 | DRG: 308 | Disposition: A | Discharge status: Skilled Nursing Facility | Payer: Medicare Other | Source: Ambulatory Visit | Attending: Internal Medicine | Admitting: Internal Medicine

## 2020-08-03 ENCOUNTER — Emergency Department (HOSPITAL_COMMUNITY): Payer: Medicare Other

## 2020-08-03 DIAGNOSIS — D7589 Other specified diseases of blood and blood-forming organs: Secondary | ICD-10-CM | POA: Diagnosis present

## 2020-08-03 DIAGNOSIS — Z9981 Dependence on supplemental oxygen: Secondary | ICD-10-CM

## 2020-08-03 DIAGNOSIS — E1122 Type 2 diabetes mellitus with diabetic chronic kidney disease: Secondary | ICD-10-CM | POA: Diagnosis present

## 2020-08-03 DIAGNOSIS — I132 Hypertensive heart and chronic kidney disease with heart failure and with stage 5 chronic kidney disease, or end stage renal disease: Secondary | ICD-10-CM | POA: Diagnosis present

## 2020-08-03 DIAGNOSIS — Z833 Family history of diabetes mellitus: Secondary | ICD-10-CM

## 2020-08-03 DIAGNOSIS — Z820 Family history of epilepsy and other diseases of the nervous system: Secondary | ICD-10-CM

## 2020-08-03 DIAGNOSIS — E785 Hyperlipidemia, unspecified: Secondary | ICD-10-CM | POA: Diagnosis present

## 2020-08-03 DIAGNOSIS — K649 Unspecified hemorrhoids: Secondary | ICD-10-CM | POA: Diagnosis present

## 2020-08-03 DIAGNOSIS — J9611 Chronic respiratory failure with hypoxia: Secondary | ICD-10-CM | POA: Diagnosis present

## 2020-08-03 DIAGNOSIS — Z853 Personal history of malignant neoplasm of breast: Secondary | ICD-10-CM

## 2020-08-03 DIAGNOSIS — N2581 Secondary hyperparathyroidism of renal origin: Secondary | ICD-10-CM | POA: Diagnosis present

## 2020-08-03 DIAGNOSIS — J449 Chronic obstructive pulmonary disease, unspecified: Secondary | ICD-10-CM | POA: Diagnosis present

## 2020-08-03 DIAGNOSIS — I495 Sick sinus syndrome: Secondary | ICD-10-CM

## 2020-08-03 DIAGNOSIS — I5032 Chronic diastolic (congestive) heart failure: Secondary | ICD-10-CM | POA: Diagnosis present

## 2020-08-03 DIAGNOSIS — I480 Paroxysmal atrial fibrillation: Principal | ICD-10-CM | POA: Diagnosis present

## 2020-08-03 DIAGNOSIS — IMO0002 Reserved for concepts with insufficient information to code with codable children: Secondary | ICD-10-CM | POA: Diagnosis present

## 2020-08-03 DIAGNOSIS — E1129 Type 2 diabetes mellitus with other diabetic kidney complication: Secondary | ICD-10-CM | POA: Diagnosis present

## 2020-08-03 DIAGNOSIS — L89152 Pressure ulcer of sacral region, stage 2: Secondary | ICD-10-CM | POA: Diagnosis present

## 2020-08-03 DIAGNOSIS — Z807 Family history of other malignant neoplasms of lymphoid, hematopoietic and related tissues: Secondary | ICD-10-CM

## 2020-08-03 DIAGNOSIS — E1151 Type 2 diabetes mellitus with diabetic peripheral angiopathy without gangrene: Secondary | ICD-10-CM | POA: Diagnosis present

## 2020-08-03 DIAGNOSIS — I959 Hypotension, unspecified: Secondary | ICD-10-CM

## 2020-08-03 DIAGNOSIS — E8889 Other specified metabolic disorders: Secondary | ICD-10-CM | POA: Diagnosis present

## 2020-08-03 DIAGNOSIS — Z992 Dependence on renal dialysis: Secondary | ICD-10-CM

## 2020-08-03 DIAGNOSIS — I4891 Unspecified atrial fibrillation: Secondary | ICD-10-CM

## 2020-08-03 DIAGNOSIS — N186 End stage renal disease: Secondary | ICD-10-CM | POA: Diagnosis present

## 2020-08-03 DIAGNOSIS — Z8616 Personal history of COVID-19: Secondary | ICD-10-CM

## 2020-08-03 DIAGNOSIS — G8929 Other chronic pain: Secondary | ICD-10-CM | POA: Diagnosis present

## 2020-08-03 DIAGNOSIS — K219 Gastro-esophageal reflux disease without esophagitis: Secondary | ICD-10-CM | POA: Diagnosis present

## 2020-08-03 DIAGNOSIS — I953 Hypotension of hemodialysis: Secondary | ICD-10-CM | POA: Diagnosis present

## 2020-08-03 DIAGNOSIS — E876 Hypokalemia: Secondary | ICD-10-CM | POA: Diagnosis present

## 2020-08-03 DIAGNOSIS — Z79899 Other long term (current) drug therapy: Secondary | ICD-10-CM

## 2020-08-03 DIAGNOSIS — Z9013 Acquired absence of bilateral breasts and nipples: Secondary | ICD-10-CM

## 2020-08-03 DIAGNOSIS — D631 Anemia in chronic kidney disease: Secondary | ICD-10-CM | POA: Diagnosis present

## 2020-08-03 DIAGNOSIS — Z993 Dependence on wheelchair: Secondary | ICD-10-CM

## 2020-08-03 DIAGNOSIS — I2510 Atherosclerotic heart disease of native coronary artery without angina pectoris: Secondary | ICD-10-CM | POA: Diagnosis present

## 2020-08-03 DIAGNOSIS — I4892 Unspecified atrial flutter: Secondary | ICD-10-CM | POA: Diagnosis present

## 2020-08-03 DIAGNOSIS — I700 Atherosclerosis of aorta: Secondary | ICD-10-CM | POA: Diagnosis present

## 2020-08-03 DIAGNOSIS — Z823 Family history of stroke: Secondary | ICD-10-CM

## 2020-08-03 DIAGNOSIS — I10 Essential (primary) hypertension: Secondary | ICD-10-CM | POA: Diagnosis present

## 2020-08-03 DIAGNOSIS — Z8249 Family history of ischemic heart disease and other diseases of the circulatory system: Secondary | ICD-10-CM

## 2020-08-03 DIAGNOSIS — M109 Gout, unspecified: Secondary | ICD-10-CM | POA: Diagnosis present

## 2020-08-03 LAB — BASIC METABOLIC PANEL
Anion gap: 13 (ref 5–15)
BUN: 21 mg/dL (ref 8–23)
CO2: 25 mmol/L (ref 22–32)
Calcium: 7.5 mg/dL — ABNORMAL LOW (ref 8.9–10.3)
Chloride: 100 mmol/L (ref 98–111)
Creatinine, Ser: 3.32 mg/dL — ABNORMAL HIGH (ref 0.44–1.00)
GFR calc Af Amer: 14 mL/min — ABNORMAL LOW (ref >60)
GFR calc non Af Amer: 12 mL/min — ABNORMAL LOW (ref >60)
Glucose, Bld: 105 mg/dL — ABNORMAL HIGH (ref 70–99)
Potassium: 4.5 mmol/L (ref 3.5–5.1)
Sodium: 138 mmol/L (ref 135–145)

## 2020-08-03 LAB — MAGNESIUM: Magnesium: 1.9 mg/dL (ref 1.7–2.4)

## 2020-08-03 LAB — CBC
HCT: 35.8 % — ABNORMAL LOW (ref 36.0–46.0)
Hemoglobin: 10.8 g/dL — ABNORMAL LOW (ref 12.0–15.0)
MCH: 32.4 pg (ref 26.0–34.0)
MCHC: 30.2 g/dL (ref 30.0–36.0)
MCV: 107.5 fL — ABNORMAL HIGH (ref 80.0–100.0)
Platelets: 184 10*3/uL (ref 150–400)
RBC: 3.33 MIL/uL — ABNORMAL LOW (ref 3.87–5.11)
RDW: 15.3 % (ref 11.5–15.5)
WBC: 7.4 10*3/uL (ref 4.0–10.5)
nRBC: 0 % (ref 0.0–0.2)

## 2020-08-03 LAB — SARS CORONAVIRUS 2 BY RT PCR (HOSPITAL ORDER, PERFORMED IN ~~LOC~~ HOSPITAL LAB): SARS Coronavirus 2: NEGATIVE

## 2020-08-03 LAB — TROPONIN I (HIGH SENSITIVITY): Troponin I (High Sensitivity): 20 ng/L — ABNORMAL HIGH (ref <18)

## 2020-08-03 MED ORDER — ASPIRIN 81 MG PO CHEW
81.0000 mg | CHEWABLE_TABLET | Freq: Every day | ORAL | Status: DC
  Administered 2020-08-04 – 2020-08-10 (×7): 81 mg via ORAL
  Filled 2020-08-03 (×7): qty 1

## 2020-08-03 MED ORDER — TRAZODONE HCL 50 MG PO TABS
50.0000 mg | ORAL_TABLET | Freq: Every day | ORAL | Status: DC
  Administered 2020-08-03 – 2020-08-09 (×7): 50 mg via ORAL
  Filled 2020-08-03 (×7): qty 1

## 2020-08-03 MED ORDER — NEPHRO-VITE 0.8 MG PO TABS: 1.0000 | ORAL_TABLET | Freq: Every day | ORAL | Status: DC

## 2020-08-03 MED ORDER — LOPERAMIDE HCL 2 MG PO CAPS
4.0000 mg | ORAL_CAPSULE | ORAL | Status: DC
  Filled 2020-08-03: qty 2

## 2020-08-03 MED ORDER — MELATONIN 5 MG PO TABS
10.0000 mg | ORAL_TABLET | Freq: Every day | ORAL | Status: DC
  Administered 2020-08-03 – 2020-08-09 (×7): 10 mg via ORAL
  Filled 2020-08-03 (×8): qty 2

## 2020-08-03 MED ORDER — DIPHENOXYLATE-ATROPINE 2.5-0.025 MG PO TABS
1.0000 | ORAL_TABLET | ORAL | Status: DC
  Filled 2020-08-03 (×2): qty 1

## 2020-08-03 MED ORDER — AZELASTINE HCL 0.1 % NA SOLN
2.0000 | Freq: Two times a day (BID) | NASAL | Status: DC
  Administered 2020-08-03 – 2020-08-10 (×9): 2 via NASAL
  Filled 2020-08-03: qty 30

## 2020-08-03 MED ORDER — ACETAMINOPHEN 325 MG PO TABS: 650.0000 mg | ORAL_TABLET | ORAL | Status: DC | PRN

## 2020-08-03 MED ORDER — ONDANSETRON HCL 4 MG/2ML IJ SOLN
4.0000 mg | Freq: Four times a day (QID) | INTRAMUSCULAR | Status: DC | PRN
  Administered 2020-08-08 – 2020-08-09 (×2): 4 mg via INTRAVENOUS
  Filled 2020-08-03 (×2): qty 2

## 2020-08-03 MED ORDER — CHOLESTYRAMINE 4 G PO PACK
4.0000 g | PACK | Freq: Two times a day (BID) | ORAL | Status: DC | PRN
  Administered 2020-08-06: 4 g via ORAL
  Filled 2020-08-03 (×2): qty 1

## 2020-08-03 MED ORDER — CALCIUM CARBONATE ANTACID 500 MG PO CHEW
1.0000 | CHEWABLE_TABLET | Freq: Two times a day (BID) | ORAL | Status: DC
  Administered 2020-08-04 – 2020-08-10 (×10): 200 mg via ORAL
  Filled 2020-08-03 (×12): qty 1

## 2020-08-03 MED ORDER — RENA-VITE PO TABS
1.0000 | ORAL_TABLET | Freq: Every day | ORAL | Status: DC
  Administered 2020-08-03 – 2020-08-09 (×7): 1 via ORAL
  Filled 2020-08-03 (×7): qty 1

## 2020-08-03 MED ORDER — ALLOPURINOL 100 MG PO TABS
100.0000 mg | ORAL_TABLET | Freq: Every day | ORAL | Status: DC
  Administered 2020-08-04 – 2020-08-10 (×7): 100 mg via ORAL
  Filled 2020-08-03 (×7): qty 1

## 2020-08-03 MED ORDER — AMIODARONE HCL 200 MG PO TABS
200.0000 mg | ORAL_TABLET | Freq: Two times a day (BID) | ORAL | Status: DC
  Administered 2020-08-03 – 2020-08-05 (×4): 200 mg via ORAL
  Filled 2020-08-03 (×4): qty 1

## 2020-08-03 MED ORDER — FLUTICASONE PROPIONATE 50 MCG/ACT NA SUSP
2.0000 | Freq: Every day | NASAL | Status: DC
  Administered 2020-08-04 – 2020-08-10 (×7): 2 via NASAL
  Filled 2020-08-03: qty 16

## 2020-08-03 MED ORDER — IPRATROPIUM BROMIDE 0.06 % NA SOLN
2.0000 | Freq: Two times a day (BID) | NASAL | Status: DC
  Administered 2020-08-05 – 2020-08-10 (×7): 2 via NASAL
  Filled 2020-08-03: qty 30
  Filled 2020-08-03: qty 15
  Filled 2020-08-03: qty 30

## 2020-08-03 MED ORDER — LACTATED RINGERS IV BOLUS
500.0000 mL | Freq: Once | INTRAVENOUS | Status: AC
  Administered 2020-08-03: 500 mL via INTRAVENOUS

## 2020-08-03 MED ORDER — METOPROLOL TARTRATE 5 MG/5ML IV SOLN
5.0000 mg | Freq: Once | INTRAVENOUS | Status: AC
  Administered 2020-08-03: 5 mg via INTRAVENOUS
  Filled 2020-08-03: qty 5

## 2020-08-03 MED ORDER — ACETAMINOPHEN 325 MG PO TABS: 650.0000 mg | ORAL_TABLET | Freq: Four times a day (QID) | ORAL | Status: DC | PRN

## 2020-08-03 MED ORDER — DIMETHICONE 1 % EX CREA
1.0000 "application " | TOPICAL_CREAM | Freq: Two times a day (BID) | CUTANEOUS | Status: DC
  Administered 2020-08-04 – 2020-08-09 (×7): 1 via TOPICAL
  Filled 2020-08-03: qty 113

## 2020-08-03 MED ORDER — LORATADINE 10 MG PO TABS
10.0000 mg | ORAL_TABLET | Freq: Every day | ORAL | Status: DC
  Administered 2020-08-04 – 2020-08-10 (×7): 10 mg via ORAL
  Filled 2020-08-03 (×7): qty 1

## 2020-08-03 MED ORDER — MENTHOL (TOPICAL ANALGESIC) 4 % EX GEL: 1.0000 "application " | Freq: Three times a day (TID) | CUTANEOUS | Status: DC | PRN

## 2020-08-03 MED ORDER — DIAZEPAM 5 MG PO TABS
5.0000 mg | ORAL_TABLET | Freq: Every day | ORAL | Status: DC
  Administered 2020-08-03 – 2020-08-09 (×7): 5 mg via ORAL
  Filled 2020-08-03 (×7): qty 1

## 2020-08-03 MED ORDER — DICLOFENAC SODIUM 1 % EX GEL
2.0000 g | Freq: Every day | CUTANEOUS | Status: DC
  Administered 2020-08-03 – 2020-08-09 (×7): 2 g via TOPICAL
  Filled 2020-08-03: qty 100

## 2020-08-03 MED ORDER — MIDODRINE HCL 5 MG PO TABS
2.5000 mg | ORAL_TABLET | Freq: Two times a day (BID) | ORAL | Status: DC | PRN
  Administered 2020-08-05 – 2020-08-08 (×6): 2.5 mg via ORAL
  Filled 2020-08-03 (×6): qty 1

## 2020-08-03 NOTE — ED Provider Notes (Signed)
Transfer of Care Note I assumed care of [name] James on 08/03/2020 at 1600.  Briefly, [name] James is a 84 y.o. female who:  Has a h/o AF w/ RVR and ESRD on HD presents from HD w/ AF and hypotension.  Rate controlled w/ Lopressor.  BP 80s/60s.  Received fluids w/ minimal improvement.  Plan to f/u labs and reassess.   The plan includes:  F/u labs and reassess   Please refer to the original provider's note for additional information regarding the care of [name] James.  Reassessment: Vital Signs:  The most current vitals were  Vitals:   08/03/20 1730 08/03/20 1801  BP: (!) 87/61 114/65  Pulse: 64   Resp: 18 19  Temp:    SpO2: 100%    Hemodynamics:  The patient is hemodynamically stable. Mental Status:  The patient is Alert  Additional MDM: Bmp, CBC, Mg unremarkable.  On reassessment pt continued to have BP 80s/60s after 1L.  Given continued hypotension in the s/o recurrent AF w/ RVR consulted hospitalist service for admission.  Hospitalist evaluated the pt at bedside and agreed w/ admission to their service.  Patient was admitted to hospitalist service in stable condition without further events.  Patient had no further events during the duration of my shift.    Silvestre Gunner, MD 08/03/20 2055    Drenda Freeze, MD 08/03/20 [phone]

## 2020-08-03 NOTE — H&P (Signed)
History and Physical   [name] SWH:[id number] DOB: 08-01-1936 DOA: 08/03/2020  Referring MD/NP/PA: Dr. Shirlyn Goltz  PCP: [facility] [place]   Outpatient Specialists: Dr. [name], nephrology  Patient coming from: Home via dialysis center  Chief Complaint: Chest discomfort and palpitation  HPI: [name] James is a 84 y.o. female with medical history significant of end-stage renal disease on hemodialysis Mondays Wednesdays and Fridays, GERD, atrial fibrillation, hyperlipidemia, hypertension, diabetes who was at hemodialysis today when she started having chest discomfort and noted to have A. fib with RVR.  She was sent to the ER before completing her hemodialysis.  Patient was seen and evaluated.  She was seen here last night with exactly the same symptoms.  At that point cardiology was consulted.  Patient was placed on amiodarone and Lopressor was discontinued with.  When she came to the ER today she was found to have rates also in the 130s 140s.  She was given a dose of Lopressor which controlled her rate now but maintained still on her amiodarone.  Patient noted to have profound hypotension.  Systolics remaining in the 80s.  She is also weak and debilitated.  She is therefore being admitted for observation to manage her hypotension.  Nephrology consulted..  ED Course: Temperature 98 blood pressure 82/53 pulse 142 respirate 24 oxygen sats 92% room air.  White count 7.4 hemoglobin 10.8 platelets 184.  Creatinine is 3.32 calcium 7.5 the rest of the chemistry appears to be within normal.  Chest x-ray showed bilateral minimal pleural effusion but no overt pulmonary edema.  Patient is therefore being admitted to the hospital for further evaluation and treatment.  Also allow nephrology  Review of Systems: As per HPI otherwise 10 point review of systems negative.    Past Medical History:  Diagnosis Date  . Anemia, chronic disease   . Anxiety   . Arthritis    knees  . Atrial fibrillation ([place])    . Barrett esophagus   . Benign paroxysmal positional vertigo   . Breast cancer ([place]) 1980s   bil mastectomies, no radiation or chemo  . Cataract   . Chest pain, atypical 12/05/2008   R/Lmv- normal perfusion all regions, noe ECG changes   . CHF (congestive heart failure) ([place]) 05/13/2011   echo - YK>59%; stage 1 diastolic dysfunction; elevated LV filling pressure, MAC  . CKD (chronic kidney disease), stage III   . Claudication ([place]) 10/30/2005   doppler - normal evaluation, no evidence of aneurysm, diameter reduction, dissection, compression or vascular abnormality)  . Complication of anesthesia   . Controlled type 2 diabetes mellitus with diabetic polyneuropathy, with long-term current use of insulin ([facility]) 04/17/2016  . Dyslipidemia   . Dysrhythmia    Atrial Fibrillation  . Edema   . ESRD (end stage renal disease) on dialysis ([place])    "[facility]; [address]; MWF" (07/06/2018)  . Family history of adverse reaction to anesthesia    son also has nausea   . Fever blister 12/2016  . Gastritis and gastroduodenitis   . GERD (gastroesophageal reflux disease)   . Gout   . Hemodialysis-associated hypotension   . HLD (hyperlipidemia)   . Hypertension   . IBS (irritable bowel syndrome)   . Insomnia   . Iron deficiency anemia   . Junctional bradycardia 08/25/2018  . LFT elevation   . Long term current use of amiodarone 10/17/2016  . Morbid (severe) obesity due to excess calories ([place]) 04/29/2017  . Morbid obesity ([place])   . On  home oxygen therapy    "2L; 24/7" (07/06/2018)  . PAF (paroxysmal atrial fibrillation) ([place])   . Peripheral neuropathy   . Pneumonia 12/2016  . PONV (postoperative nausea and vomiting)   . Renal cyst   . Renal insufficiency 10/30/2005   doppler - abn resistance consistent w/ parenchymal disease  . Renovascular hypertension   . Type 2 diabetes mellitus with diabetic neuropathy, with long-term current use of insulin ([place])   . Vertigo     Past Surgical History:    Procedure Laterality Date  . A/V FISTULAGRAM Left 03/19/2018   Procedure: A/V FISTULAGRAM;  Surgeon: Angelia Mould, MD;  Location: [place] [facility];  Service: Cardiovascular;  Laterality: Left;  . A/V FISTULAGRAM N/A 12/24/2018   Procedure: A/V FISTULAGRAM - Right Arm;  Surgeon: Angelia Mould, MD;  Location: [place] [facility];  Service: Cardiovascular;  Laterality: N/A;  . A/V FISTULAGRAM Right 03/29/2019   Procedure: A/V FISTULAGRAM;  Surgeon: Angelia Mould, MD;  Location: [place] [facility];  Service: Cardiovascular;  Laterality: Right;  . A/V FISTULAGRAM Right 02/03/2020   Procedure: A/V FISTULAGRAM;  Surgeon: Angelia Mould, MD;  Location: [place] [facility];  Service: Cardiovascular;  Laterality: Right;  . A/V SHUNTOGRAM Left 01/19/2018   Procedure: A/V SHUNTOGRAM;  Surgeon: Serafina Mitchell, MD;  Location: [place] [facility];  Service: Cardiovascular;  Laterality: Left;  . ANGIOPLASTY Left 02/12/2015   Procedure: ANGIOPLASTY;  Surgeon: Angelia Mould, MD;  Location: [facility] CATH LAB;  Service: Cardiovascular;  Laterality: Left;  AVF  . AV FISTULA PLACEMENT Left 11/30/2014   Procedure: ARTERIOVENOUS (AV) FISTULA CREATION LEFT ARM;  Surgeon: Angelia Mould, MD;  Location: [place];  Service: Vascular;  Laterality: Left;  . AV FISTULA PLACEMENT Left 03/20/2015   Procedure:  Inserton of Left Upper Arm Gortex Graft;  Surgeon: Angelia Mould, MD;  Location: [place];  Service: Vascular;  Laterality: Left;  . AV FISTULA PLACEMENT Left 04/12/2015   Procedure: INSERTION OF LEFT ARM  ARTERIOVENOUS GORE-TEX GRAFT ;  Surgeon: Angelia Mould, MD;  Location: [place];  Service: Vascular;  Laterality: Left;  . AV FISTULA PLACEMENT Right 06/08/2018   Procedure: INSERTION OF ARTERIOVENOUS (AV) GORE-TEX GRAFT ARM USING 4-7MM X 45CM GORETEX GRAFT;  Surgeon: Angelia Mould, MD;  Location: [place];  Service: Vascular;  Laterality: Right;  . BACK SURGERY  ~1980   . CATARACT EXTRACTION Right   . CHOLECYSTECTOMY    . ESOPHAGOGASTRODUODENOSCOPY N/A 10/24/2014   Procedure: ESOPHAGOGASTRODUODENOSCOPY (EGD);  Surgeon: Jerene Bears, MD;  Location: [facility] ENDOSCOPY;  Service: Endoscopy;  Laterality: N/A;  . EXCHANGE OF A DIALYSIS CATHETER Left 11/30/2014   Procedure: EXCHANGE OF A DIALYSIS CATHETER, LEFT INTERNAL JUGULAR;  Surgeon: Angelia Mould, MD;  Location: [place];  Service: Vascular;  Laterality: Left;  . EYE SURGERY Left    transplant  . FISTULOGRAM N/A 02/12/2015   Procedure: FISTULOGRAM;  Surgeon: Angelia Mould, MD;  Location: [facility] CATH LAB;  Service: Cardiovascular;  Laterality: N/A;  . HEMORRHOID SURGERY    . IR AV DIALY SHUNT INTRO NEEDLE/INTRACATH INITIAL W/PTA/IMG RIGHT Right 08/25/2018  . IR AV DIALY SHUNT INTRO NEEDLE/INTRACATH INITIAL W/PTA/IMG RIGHT Right 11/17/2018  . IR US GUIDE VASC ACCESS RIGHT  08/25/2018  . LIGATION ARTERIOVENOUS GORTEX GRAFT Left 08/27/2018   Procedure: LIGATION ARTERIOVENOUS GORTEX GRAFT ARM;  Surgeon: Angelia Mould, MD;  Location: [place];  Service: Vascular;  Laterality: Left;  . LIGATION  OF ARTERIOVENOUS  FISTULA Left 03/20/2015   Procedure: LIGATION OF ARTERIOVENOUS  FISTULA;  Surgeon: Angelia Mould, MD;  Location: [place];  Service: Vascular;  Laterality: Left;  Marland Kitchen MASTECTOMY Bilateral ~1982   bil breast reconstruction with implants  . ORIF TIBIA & FIBULA FRACTURES Left 2007   also had left non displaced malleolar fracture.   Marland Kitchen PERIPHERAL VASCULAR BALLOON ANGIOPLASTY Left 01/19/2018   Procedure: PERIPHERAL VASCULAR BALLOON ANGIOPLASTY;  Surgeon: Serafina Mitchell, MD;  Location: [place] [facility];  Service: Cardiovascular;  Laterality: Left;  fistula  . PERIPHERAL VASCULAR BALLOON ANGIOPLASTY Left 03/19/2018   Procedure: PERIPHERAL VASCULAR BALLOON ANGIOPLASTY;  Surgeon: Angelia Mould, MD;  Location: [place] [facility];  Service: Cardiovascular;  Laterality: Left;  UPPER ARM FISTULA  .  PERIPHERAL VASCULAR BALLOON ANGIOPLASTY Right 12/24/2018   Procedure: PERIPHERAL VASCULAR BALLOON ANGIOPLASTY;  Surgeon: Angelia Mould, MD;  Location: [place] [facility];  Service: Cardiovascular;  Laterality: Right;  arm fistula  . PERIPHERAL VASCULAR BALLOON ANGIOPLASTY  03/29/2019   Procedure: PERIPHERAL VASCULAR BALLOON ANGIOPLASTY;  Surgeon: Angelia Mould, MD;  Location: [place] [facility];  Service: Cardiovascular;;  . PERIPHERAL VASCULAR BALLOON ANGIOPLASTY Right 02/03/2020   Procedure: PERIPHERAL VASCULAR BALLOON ANGIOPLASTY;  Surgeon: Angelia Mould, MD;  Location: [place] [facility];  Service: Cardiovascular;  Laterality: Right;  upper arm fistula  . PERIPHERAL VASCULAR BALLOON ANGIOPLASTY  06/22/2020   Procedure: PERIPHERAL VASCULAR BALLOON ANGIOPLASTY;  Surgeon: Angelia Mould, MD;  Location: [place] [facility];  Service: Cardiovascular;;  Right AVF  . REMOVAL OF GRAFT Left 04/12/2015   Procedure: REMOVAL OF LEFT ARM ARTERIOVENOUS GORE-TEX GRAFT;  Surgeon: Angelia Mould, MD;  Location: [place];  Service: Vascular;  Laterality: Left;  . REVISION OF ARTERIOVENOUS GORETEX GRAFT Right 12/13/2019   Procedure: REVISION OF ARTERIOVENOUS GORETEX GRAFT RIGHT FOREARM;  Surgeon: Angelia Mould, MD;  Location: [place];  Service: Vascular;  Laterality: Right;     reports that she has never smoked. She has never used smokeless tobacco. She reports that she does not drink alcohol and does not use drugs.  Allergies  Allergen Reactions  . Erythromycin Swelling  . Codeine Palpitations, Rash and Other (See Comments)    GI Upset  . Erythromycin Base Other (See Comments)    GI Upset  . Penicillins Rash and Other (See Comments)    Has taken Keflex & Rocephin many times without problems Documented on MAR  . Zantac [Ranitidine Hcl] Nausea And Vomiting    Family History  Problem Relation Age of Onset  . Diabetes Mother   . Heart disease Mother   .  Non-Hodgkin's lymphoma Mother   . Heart attack Father   . Stroke Father   . Parkinson's disease Brother   . Lung disease Brother   . Heart disease Brother   . Multiple myeloma Sister      Prior to Admission medications   Medication Sig Start Date End Date Taking? Authorizing Provider  acetaminophen (TYLENOL) 325 MG tablet Take 650 mg by mouth every 6 (six) hours as needed for fever (greater than 100 F).    Yes [provider]  allopurinol (ZYLOPRIM) 100 MG tablet Take 100 mg by mouth daily.    Yes [provider]  amiodarone (PACERONE) 200 MG tablet Take 1 tablet (200 mg total) by mouth 2 (two) times daily. 08/01/20  Yes Drenda Freeze, MD  aspirin 81 MG chewable tablet Chew 81 mg by  mouth daily.    Yes [provider]  azelastine (ASTELIN) 0.1 % nasal spray Place 2 sprays into both nostrils 2 (two) times daily. 08/01/20  Yes [provider]  B Complex-C-Folic Acid (NEPHRO-VITE PO) Take 1 tablet by mouth daily.   Yes [provider]  Brinzolamide-Brimonidine (SIMBRINZA) 1-0.2 % SUSP Place 1 drop into both eyes 2 (two) times daily. (0900 & 2100)   Yes [provider]  calcium carbonate (TUMS - DOSED IN MG ELEMENTAL CALCIUM) 500 MG chewable tablet Chew 1 tablet by mouth 2 (two) times daily.   Yes [provider]  cetirizine (ZYRTEC) 10 MG tablet Take 10 mg by mouth daily.   Yes [provider]  cholestyramine (QUESTRAN) 4 g packet Take 1 packet (4 g total) by mouth 2 (two) times daily. Take twice daily before breakfast and dinner Patient taking differently: Take 4 g by mouth 2 (two) times daily as needed (removal of bile acids.).  10/13/19  Yes Lemmon, Lavone Nian, PA  diazepam (VALIUM) 5 MG tablet Take 1 tablet (5 mg total) by mouth at bedtime. 07/08/18  Yes Arrien, Jimmy Picket, MD  diclofenac Sodium (VOLTAREN) 1 % GEL Apply 2 g topically See admin instructions. Apply 2 grams to the right knee at bedtime 06/04/20   Yes [provider]  DIMETHICONE, TOPICAL, (SECURA DIMETHICONE PROTECTANT) 5 % CREA Apply 1 application topically See admin instructions. Apply a nickel-sized amount onto the buttocks area 2 times a day for barrier protection   Yes [provider]  diphenoxylate-atropine (LOMOTIL) 2.5-0.025 MG tablet Take 1 tablet by mouth See admin instructions. Take 1 tablet by mouth prior to dialysis on Mon/Wed/Fri and up to 4 times a day as needed for diarrhea or loose stools   Yes [provider]  fluticasone (FLONASE) 50 MCG/ACT nasal spray Place 2 sprays into both nostrils daily.    Yes [provider]  ipratropium (ATROVENT) 0.03 % nasal spray Place 2 sprays into both nostrils every 12 (twelve) hours. 08/02/20  Yes Hunsucker, Bonna Gains, MD  loperamide (IMODIUM A-D) 2 MG tablet Take 2 tablets (4 mg total) by mouth every 8 (eight) hours as needed for diarrhea or loose stools (for diarrhea). Patient taking differently: Take 4 mg by mouth See admin instructions. Take 4 mg by mouth on Mon/Wed/Fri prior to dialysis- may refuse, if not not needed AND every eight hours as needed for diarrhea or loose stools 03/01/19  Yes Pyrtle, Lajuan Lines, MD  Melatonin 5 MG CAPS Take 10 mg by mouth at bedtime.    Yes [provider]  Menthol, Topical Analgesic, (BIOFREEZE) 4 % GEL Apply 1 application topically 3 (three) times daily as needed (bilateral knee pain).   Yes [provider]  midodrine (PROAMATINE) 2.5 MG tablet Take 2.5 mg by mouth See admin instructions. Take 2.5 mg by mouth two times a day if B/P is <120/60   Yes [provider]  SALINE NASAL MIST NA Place 2 sprays into the nose 3 (three) times daily.   Yes [provider]  traZODone (DESYREL) 50 MG tablet Take 50 mg by mouth at bedtime.    Yes [provider]  B Complex-C-Folic Acid (NEPHRO VITAMINS) 0.8 MG TABS Take 1 tablet by mouth daily. (0900) Patient not taking: Reported on 08/03/2020     [provider]  DIMETHICONE, TOPICAL, 5 % CREA Apply 1 application topically See admin instructions. Place a nickel-sized amount onto buttock(s) area 2 times a day for barrier  protection Patient not taking: Reported on 08/03/2020    [provider]  OXYGEN Inhale 2 L into the lungs continuous.     [provider]    Physical Exam: Vitals:   08/03/20 1645 08/03/20 1715 08/03/20 1730 08/03/20 1801  BP: (!) 110/58 (!) 88/56 (!) 87/61 114/65  Pulse: 68 63 64   Resp: _0 Temp:      TempSrc:      SpO2: 100% 100% 100%       Constitutional: Chronically ill looking, no distress Vitals:   08/03/20 1645 08/03/20 1715 08/03/20 1730 08/03/20 1801  BP: (!) 110/58 (!) 88/56 (!) 87/61 114/65  Pulse: 68 63 64   Resp: _1 Temp:      TempSrc:      SpO2: 100% 100% 100%    Eyes: PERRL, lids and conjunctivae normal ENMT: Mucous membranes are moist. Posterior pharynx clear of any exudate or lesions.Normal dentition.  Neck: normal, supple, no masses, no thyromegaly Respiratory: Decreased air entry bilaterally at the bases with some crackles normal respiratory effort. No accessory muscle use.  Cardiovascular: Irregularly irregular with tachycardia no murmurs / rubs / gallops.  1+ extremity edema. 2+ pedal pulses. No carotid bruits.  Abdomen: no tenderness, no masses palpated. No hepatosplenomegaly. Bowel sounds positive.  Musculoskeletal: no clubbing / cyanosis. No joint deformity upper and lower extremities. Good ROM, no contractures. Normal muscle tone.  Skin: no rashes, lesions, ulcers. No induration Neurologic: CN 2-12 grossly intact. Sensation intact, DTR normal. Strength 5/5 in all 4.  Psychiatric: Normal judgment and insight. Alert and oriented x 3. Normal mood.     Labs on Admission: I have personally reviewed following labs and imaging studies  CBC: Recent Labs  Lab 08/01/20 1052 08/01/20 1115 08/03/20 1450  WBC 6.5  --  7.4  NEUTROABS  4.5  --   --   HGB 11.1* 12.9 10.8*  HCT 37.3 38.0 35.8*  MCV 108.1*  --  107.5*  PLT 177  --  035   Basic Metabolic Panel: Recent Labs  Lab 08/01/20 1052 08/01/20 1115 08/01/20 1302 08/01/20 1639 08/03/20 1450  NA 140 140  --   --  138  K 3.2* 3.1*  --   --  4.5  CL 98 97*  --   --  100  CO2 29  --   --   --  25  GLUCOSE 106* 100*  --   --  105*  BUN 22 24*  --   --  21  CREATININE 3.63* 3.40*  --   --  3.32*  CALCIUM 8.3*  --   --   --  7.5*  MG  --   --  1.9 2.0 1.9   GFR: CrCl cannot be calculated (Unknown ideal weight.). Liver Function Tests: Recent Labs  Lab 08/01/20 1052  AST 24  ALT 13  ALKPHOS 81  BILITOT 0.8  PROT 6.9  ALBUMIN 2.7*   No results for input(s): LIPASE, AMYLASE in the last 168 hours. No results for input(s): AMMONIA in the last 168 hours. Coagulation Profile: No results for input(s): INR, PROTIME in the last 168 hours. Cardiac Enzymes: No results for input(s): CKTOTAL, CKMB, CKMBINDEX, TROPONINI in the last 168 hours. BNP (last 3 results) No results for input(s): PROBNP in the last 8760 hours. HbA1C: No results for input(s): HGBA1C in the last 72 hours. CBG: No results for input(s): GLUCAP in the last 168 hours. Lipid Profile: No  results for input(s): CHOL, HDL, LDLCALC, TRIG, CHOLHDL, LDLDIRECT in the last 72 hours. Thyroid Function Tests: Recent Labs    08/01/20 1637  TSH 0.947   Anemia Panel: No results for input(s): VITAMINB12, FOLATE, FERRITIN, TIBC, IRON, RETICCTPCT in the last 72 hours. Urine analysis:    Component Value Date/Time   COLORURINE AMBER (A) 03/14/2019 0435   APPEARANCEUR TURBID (A) 03/14/2019 0435   LABSPEC 1.014 03/14/2019 [place] 7.0 03/14/2019 0435   GLUCOSEU NEGATIVE 03/14/2019 0435   HGBUR SMALL (A) 03/14/2019 0435   BILIRUBINUR NEGATIVE 03/14/2019 0435   BILIRUBINUR neg 04/02/2014 0848   KETONESUR NEGATIVE 03/14/2019 0435   PROTEINUR 100 (A) 03/14/2019 0435   UROBILINOGEN 0.2 10/26/2014  2043   NITRITE POSITIVE (A) 03/14/2019 0435   LEUKOCYTESUR LARGE (A) 03/14/2019 0435   Sepsis Labs: _0 (procalcitonin:4,lacticidven:4) )No results found for this or any previous visit (from the past 240 hour(s)).   Radiological Exams on Admission: DG Chest Port 1 View  Result Date: 08/03/2020 CLINICAL DATA:  Dyspnea, new onset atrial fibrillation EXAM: PORTABLE CHEST 1 VIEW COMPARISON:  08/01/2020 FINDINGS: Small left pleural effusion with associated left basilar compressive atelectasis and retrocardiac opacification is unchanged. Tiny right pleural effusion is suspected, unchanged. Lung volumes are small, but symmetric. No superimposed focal pulmonary infiltrate. No pneumothorax. Cardiac size within normal limits. No acute bone abnormality. IMPRESSION: 1. Unchanged small left pleural effusion with associated left basilar compressive atelectasis and retrocardiac opacification. 2. Probable tiny right pleural effusion, unchanged. 3. Progressive pulmonary hypoinflation. Electronically Signed   By: Fidela Salisbury MD   On: 08/03/2020 15:36    EKG: Independently reviewed.  It showed atrial fibrillation with rapid ventricular response rate of 140s.  Assessment/Plan Principal Problem:   Rapid atrial fibrillation (HCC) Active Problems:   Essential hypertension   DM type 2, uncontrolled, with renal complications (HCC)   Hypotension   ESRD on dialysis (HCC)   Chronic diastolic (congestive) heart failure (HCC)   Chronic respiratory failure with hypoxia (HCC)     #1 A. fib with RVR: Rate is now controlled with a dose of Lopressor.  Patient was taken off beta-blockers and placed on amiodarone yesterday.  We will maintain the amiodarone.  Observe overnight if rate stays down we keep further otherwise cardiology consult for further adjustments.  #2 hypotension: Patient's blood pressure remains low in the 80s.  We will consider IV fluids boluses.  But will defer to nephrology to decide.  #3  end-stage renal disease: On hemodialysis.  Nephrology consulted to decide on hemodialysis.  #4 diabetes: Sliding scale insulin.  #5 chronic CHF: Currently appears fluid overloaded.  Defer to nephrology however.   DVT prophylaxis: SCD Code Status: Full code Family Communication: No family at bedside but daughter over the phone Disposition Plan: Home Consults called: Nephrology Dr. Augustin Coupe Admission status: Observation  Severity of Illness: The appropriate patient status for this patient is OBSERVATION. Observation status is judged to be reasonable and necessary in order to provide the required intensity of service to ensure the patient's safety. The patient's presenting symptoms, physical exam findings, and initial radiographic and laboratory data in the context of their medical condition is felt to place them at decreased risk for further clinical deterioration. Furthermore, it is anticipated that the patient will be medically stable for discharge from the hospital within 2 midnights of admission. The following factors support the patient status of observation.   " The patient's presenting symptoms include A. fib with RVR. " The physical exam  findings include evidence of fluid overload. " The initial radiographic and laboratory data are largely within normal.     Krisanne Lich,LAWAL MD [facility] Pager [phone]  If 7PM-7AM, please contact night-coverage [email or web address] Password [facility]  08/03/2020, 8:04 PM

## 2020-08-03 NOTE — ED Notes (Signed)
EDP made aware pt back hypotensive. Verbal order for another 500cc bolus.

## 2020-08-03 NOTE — ED Triage Notes (Signed)
Pt here via ems from dialysis with afib rvr. Per ems, pt NSR at dialysis initially and went into afib rvr approx 1 hour after tx started. Pt also with decreased po intake since Sunday. Given 10mg  cardizem X2 with EMS and then started on 5mg /hour drip. Pt other VSS with ems. Wears 2L [name] at baseline.

## 2020-08-03 NOTE — ED Provider Notes (Signed)
[facility] EMERGENCY DEPARTMENT Provider Note   CSN: [id number] Arrival date & time: 08/03/20  1404     History Chief Complaint  Patient presents with  . Atrial Fibrillation    [name] James is a 84 y.o. female.  HPI  Patient presents from dialysis for atrial fibrillation.  Patient has a longstanding history of intermittent atrial fibrillation with RVR.  Patient was at dialysis earlier where she was receiving her dialysis treatment and subsequently developed symptoms approximately 1 hour into treatment.  She had about half of her session completed.  EMS gave 10 mg of Cardizem and drip was initiated during route however patient had minimal improvement in her heart rate.  She reports decreased p.o. intake.  She was seen in ED recently for atrial fibrillation with RVR and treated with metoprolol.  Cardiology was consulted that time as well and recommended discontinuing beta-blockade in the long-term and starting only amiodarone for management.     Past Medical History:  Diagnosis Date  . Anemia, chronic disease   . Anxiety   . Arthritis    knees  . Atrial fibrillation ([place])   . Barrett esophagus   . Benign paroxysmal positional vertigo   . Breast cancer ([place]) 1980s   bil mastectomies, no radiation or chemo  . Cataract   . Chest pain, atypical 12/05/2008   R/Lmv- normal perfusion all regions, noe ECG changes   . CHF (congestive heart failure) ([place]) 05/13/2011   echo - DJ>49%; stage 1 diastolic dysfunction; elevated LV filling pressure, MAC  . CKD (chronic kidney disease), stage III   . Claudication ([place]) 10/30/2005   doppler - normal evaluation, no evidence of aneurysm, diameter reduction, dissection, compression or vascular abnormality)  . Complication of anesthesia   . Controlled type 2 diabetes mellitus with diabetic polyneuropathy, with long-term current use of insulin ([place]) 04/17/2016  . Dyslipidemia   . Dysrhythmia    Atrial Fibrillation  . Edema   .  ESRD (end stage renal disease) on dialysis ([place])    "[facility]; [address]; MWF" (07/06/2018)  . Family history of adverse reaction to anesthesia    son also has nausea   . Fever blister 12/2016  . Gastritis and gastroduodenitis   . GERD (gastroesophageal reflux disease)   . Gout   . Hemodialysis-associated hypotension   . HLD (hyperlipidemia)   . Hypertension   . IBS (irritable bowel syndrome)   . Insomnia   . Iron deficiency anemia   . Junctional bradycardia 08/25/2018  . LFT elevation   . Long term current use of amiodarone 10/17/2016  . Morbid (severe) obesity due to excess calories ([place]) 04/29/2017  . Morbid obesity ([place])   . On home oxygen therapy    "2L; 24/7" (07/06/2018)  . PAF (paroxysmal atrial fibrillation) ([place])   . Peripheral neuropathy   . Pneumonia 12/2016  . PONV (postoperative nausea and vomiting)   . Renal cyst   . Renal insufficiency 10/30/2005   doppler - abn resistance consistent w/ parenchymal disease  . Renovascular hypertension   . Type 2 diabetes mellitus with diabetic neuropathy, with long-term current use of insulin ([place])   . Vertigo     Patient Active Problem List   Diagnosis Date Noted  . Nausea and vomiting 03/14/2019  . Chronic diarrhea 03/14/2019  . Dysuria 03/14/2019  . UTI (urinary tract infection) 03/14/2019  . Aortic atherosclerosis ([place]) 12/30/2018  . Hypoalbuminemia due to protein-calorie malnutrition ([place]) 12/30/2018  . Sick sinus syndrome ([place])   .  Symptomatic bradycardia 08/24/2018  . Gout 08/23/2018  . Anxiety 08/23/2018  . Pneumothorax, left 10/13/2017  . Multiple lung nodules on CT 10/06/2017  . Pleural effusion, left 10/06/2017  . Diabetic polyneuropathy associated with type 2 diabetes mellitus ([place]) 07/30/2017  . Onychomycosis of toenail 07/30/2017  . Idiopathic chronic venous hypertension of both lower extremities with inflammation 07/30/2017  . Chronic respiratory failure with hypoxia ([place]) 04/29/2017  . Advance care  planning   . Goals of care, counseling/discussion   . Palliative care by specialist   . Pressure ulcer 01/19/2017  . History of pressure ulcer 10/17/2016  . GAD (generalized anxiety disorder) 10/17/2016  . Osteopenia determined by x-ray 04/17/2016  . Type 2 diabetes mellitus with diabetic neuropathy ([place]) 06/08/2015  . Type 2 diabetes, controlled, with renal manifestation ([place]) 06/08/2015  . PVD (peripheral vascular disease) ([place]) 06/08/2015  . Swelling of limb-Left arm 04/10/2015  . Nausea with vomiting 01/12/2015  . Normocytic anemia 01/12/2015  . Paroxysmal atrial fibrillation ([place]) 12/22/2014  . Melena   . ESRD on dialysis ([place]) 12/19/2014  . Chronic diastolic (congestive) heart failure ([place]) 12/19/2014  . Palpitation 12/19/2014  . Anemia, chronic disease 12/13/2014  . Hemodialysis-associated hypotension 12/13/2014  . Pulmonary edema 11/10/2014  . Physical deconditioning 10/29/2014  . Gastritis and gastroduodenitis 10/24/2014  . Heme positive stool 10/24/2014  . Morbid obesity ([place]) 10/18/2014  . Edema 10/18/2014  . Essential hypertension 10/17/2014  . DM type 2, uncontrolled, with renal complications ([place]) 83/66/2947  . Dyslipidemia 10/17/2014    Past Surgical History:  Procedure Laterality Date  . A/V FISTULAGRAM Left 03/19/2018   Procedure: A/V FISTULAGRAM;  Surgeon: Angelia Mould, MD;  Location: [facility] CV LAB;  Service: Cardiovascular;  Laterality: Left;  . A/V FISTULAGRAM N/A 12/24/2018   Procedure: A/V FISTULAGRAM - Right Arm;  Surgeon: Angelia Mould, MD;  Location: [facility] CV LAB;  Service: Cardiovascular;  Laterality: N/A;  . A/V FISTULAGRAM Right 03/29/2019   Procedure: A/V FISTULAGRAM;  Surgeon: Angelia Mould, MD;  Location: [facility];  Service: Cardiovascular;  Laterality: Right;  . A/V FISTULAGRAM Right 02/03/2020   Procedure: A/V FISTULAGRAM;  Surgeon: Angelia Mould, MD;  Location: [facility] LAB;  Service:  Cardiovascular;  Laterality: Right;  . A/V SHUNTOGRAM Left 01/19/2018   Procedure: A/V SHUNTOGRAM;  Surgeon: Serafina Mitchell, MD;  Location: [facility] CV LAB;  Service: Cardiovascular;  Laterality: Left;  . ANGIOPLASTY Left 02/12/2015   Procedure: ANGIOPLASTY;  Surgeon: Angelia Mould, MD;  Location: [facility] CATH LAB;  Service: Cardiovascular;  Laterality: Left;  AVF  . AV FISTULA PLACEMENT Left 11/30/2014   Procedure: ARTERIOVENOUS (AV) FISTULA CREATION LEFT ARM;  Surgeon: Angelia Mould, MD;  Location: [facility];  Service: Vascular;  Laterality: Left;  . AV FISTULA PLACEMENT Left 03/20/2015   Procedure:  Inserton of Left Upper Arm Gortex Graft;  Surgeon: Angelia Mould, MD;  Location: [facility];  Service: Vascular;  Laterality: Left;  . AV FISTULA PLACEMENT Left 04/12/2015   Procedure: INSERTION OF LEFT ARM  ARTERIOVENOUS GORE-TEX GRAFT ;  Surgeon: Angelia Mould, MD;  Location: [facility];  Service: Vascular;  Laterality: Left;  . AV FISTULA PLACEMENT Right 06/08/2018   Procedure: INSERTION OF ARTERIOVENOUS (AV) GORE-TEX GRAFT ARM USING 4-7MM X 45CM GORETEX GRAFT;  Surgeon: Angelia Mould, MD;  Location: [facility];  Service: Vascular;  Laterality: Right;  . BACK SURGERY  ~1980  . CATARACT EXTRACTION Right   .  CHOLECYSTECTOMY    . ESOPHAGOGASTRODUODENOSCOPY N/A 10/24/2014   Procedure: ESOPHAGOGASTRODUODENOSCOPY (EGD);  Surgeon: Jerene Bears, MD;  [facility] ENDOSCOPY;  Service: Endoscopy;  Laterality: N/A;  . EXCHANGE OF A DIALYSIS CATHETER Left 11/30/2014   Procedure: EXCHANGE OF A DIALYSIS CATHETER, LEFT INTERNAL JUGULAR;  Surgeon: Angelia Mould, MD;  Location: [facility];  Service: Vascular;  Laterality: Left;  . EYE SURGERY Left    transplant  . FISTULOGRAM N/A 02/12/2015   Procedure: FISTULOGRAM;  Surgeon: Angelia Mould, MD;  Location: [facility] CATH LAB;  Service: Cardiovascular;  Laterality: N/A;  . HEMORRHOID SURGERY    . IR AV DIALY SHUNT INTRO NEEDLE/INTRACATH  INITIAL W/PTA/IMG RIGHT Right 08/25/2018  . IR AV DIALY SHUNT INTRO NEEDLE/INTRACATH INITIAL W/PTA/IMG RIGHT Right 11/17/2018  . IR US GUIDE VASC ACCESS RIGHT  08/25/2018  . LIGATION ARTERIOVENOUS GORTEX GRAFT Left 08/27/2018   Procedure: LIGATION ARTERIOVENOUS GORTEX GRAFT ARM;  Surgeon: Angelia Mould, MD;  Location: [facility];  Service: Vascular;  Laterality: Left;  . LIGATION OF ARTERIOVENOUS  FISTULA Left 03/20/2015   Procedure: LIGATION OF ARTERIOVENOUS  FISTULA;  Surgeon: Angelia Mould, MD;  Location: [facility];  Service: Vascular;  Laterality: Left;  Marland Kitchen MASTECTOMY Bilateral ~1982   bil breast reconstruction with implants  . ORIF TIBIA & FIBULA FRACTURES Left 2007   also had left non displaced malleolar fracture.   Marland Kitchen PERIPHERAL VASCULAR BALLOON ANGIOPLASTY Left 01/19/2018   Procedure: PERIPHERAL VASCULAR BALLOON ANGIOPLASTY;  Surgeon: Serafina Mitchell, MD;  Location: [facility] CV LAB;  Service: Cardiovascular;  Laterality: Left;  fistula  . PERIPHERAL VASCULAR BALLOON ANGIOPLASTY Left 03/19/2018   Procedure: PERIPHERAL VASCULAR BALLOON ANGIOPLASTY;  Surgeon: Angelia Mould, MD;  Location: [facility] CV LAB;  Service: Cardiovascular;  Laterality: Left;  UPPER ARM FISTULA  . PERIPHERAL VASCULAR BALLOON ANGIOPLASTY Right 12/24/2018   Procedure: PERIPHERAL VASCULAR BALLOON ANGIOPLASTY;  Surgeon: Angelia Mould, MD;  Location: [facility] CV LAB;  Service: Cardiovascular;  Laterality: Right;  arm fistula  . PERIPHERAL VASCULAR BALLOON ANGIOPLASTY  03/29/2019   Procedure: PERIPHERAL VASCULAR BALLOON ANGIOPLASTY;  Surgeon: Angelia Mould, MD;  Location: [facility] CV LAB;  Service: Cardiovascular;;  . PERIPHERAL VASCULAR BALLOON ANGIOPLASTY Right 02/03/2020   Procedure: PERIPHERAL VASCULAR BALLOON ANGIOPLASTY;  Surgeon: Angelia Mould, MD;  Location: [facility] CV LAB;  Service: Cardiovascular;  Laterality: Right;  upper arm fistula  . PERIPHERAL VASCULAR BALLOON  ANGIOPLASTY  06/22/2020   Procedure: PERIPHERAL VASCULAR BALLOON ANGIOPLASTY;  Surgeon: Angelia Mould, MD;  Location: [facility] CV LAB;  Service: Cardiovascular;;  Right AVF  . REMOVAL OF GRAFT Left 04/12/2015   Procedure: REMOVAL OF LEFT ARM ARTERIOVENOUS GORE-TEX GRAFT;  Surgeon: Angelia Mould, MD;  Location: [facility];  Service: Vascular;  Laterality: Left;  . REVISION OF ARTERIOVENOUS GORETEX GRAFT Right 12/13/2019   Procedure: REVISION OF ARTERIOVENOUS GORETEX GRAFT RIGHT FOREARM;  Surgeon: Angelia Mould, MD;  Location: [facility];  Service: Vascular;  Laterality: Right;     OB History   No obstetric history on file.     Family History  Problem Relation Age of Onset  . Diabetes Mother   . Heart disease Mother   . Non-Hodgkin's lymphoma Mother   . Heart attack Father   . Stroke Father   . Parkinson's disease Brother   . Lung disease Brother   . Heart disease Brother   . Multiple myeloma Sister     Social History  Tobacco Use  . Smoking status: Never Smoker  . Smokeless tobacco: Never Used  Vaping Use  . Vaping Use: Never used  Substance Use Topics  . Alcohol use: No  . Drug use: No    Home Medications Prior to Admission medications   Medication Sig Start Date End Date Taking? Authorizing Provider  acetaminophen (TYLENOL) 325 MG tablet Take 650 mg by mouth every 6 (six) hours as needed for fever (greater than 100 F).    Yes [provider]  allopurinol (ZYLOPRIM) 100 MG tablet Take 100 mg by mouth daily.    Yes [provider]  amiodarone (PACERONE) 200 MG tablet Take 1 tablet (200 mg total) by mouth 2 (two) times daily. 08/01/20  Yes Drenda Freeze, MD  aspirin 81 MG chewable tablet Chew 81 mg by mouth daily.    Yes [provider]  azelastine (ASTELIN) 0.1 % nasal spray Place 2 sprays into both nostrils 2 (two) times daily. 08/01/20  Yes [provider]  B Complex-C-Folic Acid (NEPHRO-VITE PO) Take 1 tablet by  mouth daily.   Yes [provider]  Brinzolamide-Brimonidine (SIMBRINZA) 1-0.2 % SUSP Place 1 drop into both eyes 2 (two) times daily. (0900 & 2100)   Yes [provider]  calcium carbonate (TUMS - DOSED IN MG ELEMENTAL CALCIUM) 500 MG chewable tablet Chew 1 tablet by mouth 2 (two) times daily.   Yes [provider]  cetirizine (ZYRTEC) 10 MG tablet Take 10 mg by mouth daily.   Yes [provider]  cholestyramine (QUESTRAN) 4 g packet Take 1 packet (4 g total) by mouth 2 (two) times daily. Take twice daily before breakfast and dinner Patient taking differently: Take 4 g by mouth 2 (two) times daily as needed (removal of bile acids.).  10/13/19  Yes Lemmon, Lavone Nian, PA  diazepam (VALIUM) 5 MG tablet Take 1 tablet (5 mg total) by mouth at bedtime. 07/08/18  Yes Arrien, Jimmy Picket, MD  diclofenac Sodium (VOLTAREN) 1 % GEL Apply 2 g topically See admin instructions. Apply 2 grams to the right knee at bedtime 06/04/20  Yes [provider]  DIMETHICONE, TOPICAL, (SECURA DIMETHICONE PROTECTANT) 5 % CREA Apply 1 application topically See admin instructions. Apply a nickel-sized amount onto the buttocks area 2 times a day for barrier protection   Yes [provider]  diphenoxylate-atropine (LOMOTIL) 2.5-0.025 MG tablet Take 1 tablet by mouth See admin instructions. Take 1 tablet by mouth prior to dialysis on Mon/Wed/Fri and up to 4 times a day as needed for diarrhea or loose stools   Yes [provider]  fluticasone (FLONASE) 50 MCG/ACT nasal spray Place 2 sprays into both nostrils daily.    Yes [provider]  ipratropium (ATROVENT) 0.03 % nasal spray Place 2 sprays into both nostrils every 12 (twelve) hours. 08/02/20  Yes Hunsucker, Bonna Gains, MD  loperamide (IMODIUM A-D) 2 MG tablet Take 2 tablets (4 mg total) by mouth every 8 (eight) hours as needed for diarrhea or loose stools (for diarrhea). Patient taking differently: Take 4  mg by mouth See admin instructions. Take 4 mg by mouth on Mon/Wed/Fri prior to dialysis- may refuse, if not not needed AND every eight hours as needed for diarrhea or loose stools 03/01/19  Yes Pyrtle, Lajuan Lines, MD  Melatonin 5 MG CAPS Take 10 mg by mouth at bedtime.    Yes [provider]  Menthol, Topical Analgesic, (BIOFREEZE) 4 % GEL Apply 1 application topically 3 (three)  times daily as needed (bilateral knee pain).   Yes [provider]  midodrine (PROAMATINE) 2.5 MG tablet Take 2.5 mg by mouth See admin instructions. Take 2.5 mg by mouth two times a day if B/P is <120/60   Yes [provider]  SALINE NASAL MIST NA Place 2 sprays into the nose 3 (three) times daily.   Yes [provider]  traZODone (DESYREL) 50 MG tablet Take 50 mg by mouth at bedtime.    Yes [provider]  B Complex-C-Folic Acid (NEPHRO VITAMINS) 0.8 MG TABS Take 1 tablet by mouth daily. (0900) Patient not taking: Reported on 08/03/2020    [provider]  DIMETHICONE, TOPICAL, 5 % CREA Apply 1 application topically See admin instructions. Place a nickel-sized amount onto buttock(s) area 2 times a day for barrier protection Patient not taking: Reported on 08/03/2020    [provider]  OXYGEN Inhale 2 L into the lungs continuous.     [provider]    Allergies    Erythromycin, Codeine, Erythromycin base, Penicillins, and Zantac [ranitidine hcl]  Review of Systems   Review of Systems  Constitutional: Negative for chills and fever.  HENT: Negative for ear pain and sore throat.   Eyes: Negative for pain and visual disturbance.  Respiratory: Negative for cough and shortness of breath.   Cardiovascular: Positive for palpitations. Negative for chest pain.  Gastrointestinal: Negative for abdominal pain and vomiting.  Genitourinary: Negative for dysuria and hematuria.  Musculoskeletal: Negative for arthralgias and back pain.  Skin: Negative for color  change and rash.  Neurological: Negative for seizures and syncope.  All other systems reviewed and are negative.   Physical Exam Updated Vital Signs BP (!) 87/61   Pulse 64   Temp 98 F (36.7 C) (Oral)   Resp 18   SpO2 100%   Physical Exam Vitals and nursing note reviewed.  Constitutional:      General: She is not in acute distress.    Appearance: Normal appearance. She is well-developed and normal weight. She is not ill-appearing or toxic-appearing.  HENT:     Head: Normocephalic and atraumatic.  Eyes:     Extraocular Movements: Extraocular movements intact.     Conjunctiva/sclera: Conjunctivae normal.     Pupils: Pupils are equal, round, and reactive to light.  Cardiovascular:     Rate and Rhythm: Tachycardia present. Rhythm irregular.     Pulses: Normal pulses.     Heart sounds: No murmur heard.   Pulmonary:     Effort: Pulmonary effort is normal. No respiratory distress.     Breath sounds: Normal breath sounds.  Abdominal:     General: There is no distension.     Palpations: Abdomen is soft.     Tenderness: There is no abdominal tenderness.  Musculoskeletal:     Cervical back: Neck supple.     Right lower leg: No edema.     Left lower leg: No edema.  Skin:    General: Skin is warm and dry.     Capillary Refill: Capillary refill takes less than 2 seconds.  Neurological:     General: No focal deficit present.     Mental Status: She is alert and oriented to person, place, and time. Mental status is at baseline.  Psychiatric:        Mood and Affect: Mood normal.     ED Results / Procedures / Treatments   Labs (all labs ordered are listed, but only abnormal results  are displayed) Labs Reviewed  CBC - Abnormal; Notable for the following components:      Result Value   RBC 3.33 (*)    Hemoglobin 10.8 (*)    HCT 35.8 (*)    MCV 107.5 (*)    All other components within normal limits  BASIC METABOLIC PANEL - Abnormal; Notable for the following components:    Glucose, Bld 105 (*)    Creatinine, Ser 3.32 (*)    Calcium 7.5 (*)    GFR calc non Af Amer 12 (*)    GFR calc Af Amer 14 (*)    All other components within normal limits  TROPONIN I (HIGH SENSITIVITY) - Abnormal; Notable for the following components:   Troponin I (High Sensitivity) 20 (*)    All other components within normal limits  MAGNESIUM    EKG EKG Interpretation  Date/Time:  Friday August 03 2020 14:05:08 EDT Ventricular Rate:  139 PR Interval:    QRS Duration: 88 QT Interval:  338 QTC Calculation: 514 R Axis:   12 Text Interpretation: Atrial fibrillation with rapid ventricular response Confirmed by Pattricia Boss [phone]) on 08/03/2020 2:09:17 PM   Radiology DG Chest Port 1 View  Result Date: 08/03/2020 CLINICAL DATA:  Dyspnea, new onset atrial fibrillation EXAM: PORTABLE CHEST 1 VIEW COMPARISON:  08/01/2020 FINDINGS: Small left pleural effusion with associated left basilar compressive atelectasis and retrocardiac opacification is unchanged. Tiny right pleural effusion is suspected, unchanged. Lung volumes are small, but symmetric. No superimposed focal pulmonary infiltrate. No pneumothorax. Cardiac size within normal limits. No acute bone abnormality. IMPRESSION: 1. Unchanged small left pleural effusion with associated left basilar compressive atelectasis and retrocardiac opacification. 2. Probable tiny right pleural effusion, unchanged. 3. Progressive pulmonary hypoinflation. Electronically Signed   By: Fidela Salisbury MD   On: 08/03/2020 15:36    Procedures Procedures (including critical care time)  Medications Ordered in ED Medications  metoprolol tartrate (LOPRESSOR) injection 5 mg (5 mg Intravenous Given 08/03/20 1426)  lactated ringers bolus 500 mL (0 mLs Intravenous Stopped 08/03/20 1500)    ED Course   [name] James is a 84 y.o. female with PMHx listed that presents to the Emergency Department complaint of Atrial Fibrillation  ED Course: Initial exam  completed.   Well-appearing and hemodynamically stable.  Nontoxic and afebrile.  Physical exam significant for age-appropriate 83 year old female with no evidence of volume overload on exam, irregularly irregular on telemetry consistent with atrial fibrillation with RVR.  Initial differential includes multiple tachyarrhythmias including A. fib/RVR, SVT, electrolyte abnormalities, volume overload, and dehydration.   Basic labs ordered.  EKG is consistent with atrial fibrillation with RVR.  Cardizem already given by EMS with minimal improvement.  Will give 500 cc LR bolus and 5 mg IVP metoprolol for treatment.  Suspect patient's BP will improve with rate control and mild resuscitation as her rate issues may be driven by volume changes during dialysis.  After IVP metoprolol, spontaneous conversion to sinus rhythm, rate 60s. Also mild hypotension. Additional 500cc bolus ordered.   Labs pending.    Diagnostics Vital Signs: reviewed EKG: reviewed Records: nursing notes along with previous records reviewed and pertinent data discussed   Consults:  none   Reevaluation/Disposition:  Care to transitioned to oncoming provider for further management; pending labs and fluid resuscitation. If BP improves and remains NSR, may be appropriate for discharge, however if remains hypotensive, may need admission for further management/monitoring.    Sherolyn Buba, MD Emergency Medicine, PGY-3   Note: Viviann Spare  medical dictation software was used in the creation of this note.    Final Clinical Impression(s) / ED Diagnoses Final diagnoses:  Atrial fibrillation with RVR [facility])    Rx / DC Orders ED Discharge Orders    None       Frann Rider, MD 08/03/20 1744    Pattricia Boss, MD 08/06/20 1407

## 2020-08-04 DIAGNOSIS — I4891 Unspecified atrial fibrillation: Secondary | ICD-10-CM | POA: Diagnosis present

## 2020-08-04 DIAGNOSIS — I5032 Chronic diastolic (congestive) heart failure: Secondary | ICD-10-CM | POA: Diagnosis present

## 2020-08-04 DIAGNOSIS — Z7189 Other specified counseling: Secondary | ICD-10-CM | POA: Diagnosis not present

## 2020-08-04 DIAGNOSIS — N186 End stage renal disease: Secondary | ICD-10-CM | POA: Diagnosis present

## 2020-08-04 DIAGNOSIS — E8889 Other specified metabolic disorders: Secondary | ICD-10-CM | POA: Diagnosis present

## 2020-08-04 DIAGNOSIS — K649 Unspecified hemorrhoids: Secondary | ICD-10-CM | POA: Diagnosis present

## 2020-08-04 DIAGNOSIS — D631 Anemia in chronic kidney disease: Secondary | ICD-10-CM | POA: Diagnosis present

## 2020-08-04 DIAGNOSIS — E1151 Type 2 diabetes mellitus with diabetic peripheral angiopathy without gangrene: Secondary | ICD-10-CM | POA: Diagnosis present

## 2020-08-04 DIAGNOSIS — I480 Paroxysmal atrial fibrillation: Secondary | ICD-10-CM | POA: Diagnosis present

## 2020-08-04 DIAGNOSIS — I4892 Unspecified atrial flutter: Secondary | ICD-10-CM | POA: Diagnosis present

## 2020-08-04 DIAGNOSIS — E1129 Type 2 diabetes mellitus with other diabetic kidney complication: Secondary | ICD-10-CM

## 2020-08-04 DIAGNOSIS — I2510 Atherosclerotic heart disease of native coronary artery without angina pectoris: Secondary | ICD-10-CM | POA: Diagnosis present

## 2020-08-04 DIAGNOSIS — I700 Atherosclerosis of aorta: Secondary | ICD-10-CM | POA: Diagnosis present

## 2020-08-04 DIAGNOSIS — J9611 Chronic respiratory failure with hypoxia: Secondary | ICD-10-CM | POA: Diagnosis present

## 2020-08-04 DIAGNOSIS — I495 Sick sinus syndrome: Secondary | ICD-10-CM | POA: Diagnosis present

## 2020-08-04 DIAGNOSIS — G8929 Other chronic pain: Secondary | ICD-10-CM | POA: Diagnosis present

## 2020-08-04 DIAGNOSIS — Z992 Dependence on renal dialysis: Secondary | ICD-10-CM

## 2020-08-04 DIAGNOSIS — E1165 Type 2 diabetes mellitus with hyperglycemia: Secondary | ICD-10-CM

## 2020-08-04 DIAGNOSIS — I953 Hypotension of hemodialysis: Secondary | ICD-10-CM | POA: Diagnosis present

## 2020-08-04 DIAGNOSIS — I342 Nonrheumatic mitral (valve) stenosis: Secondary | ICD-10-CM | POA: Diagnosis not present

## 2020-08-04 DIAGNOSIS — I959 Hypotension, unspecified: Secondary | ICD-10-CM

## 2020-08-04 DIAGNOSIS — Z8616 Personal history of COVID-19: Secondary | ICD-10-CM | POA: Diagnosis not present

## 2020-08-04 DIAGNOSIS — Z515 Encounter for palliative care: Secondary | ICD-10-CM

## 2020-08-04 DIAGNOSIS — J449 Chronic obstructive pulmonary disease, unspecified: Secondary | ICD-10-CM | POA: Diagnosis present

## 2020-08-04 DIAGNOSIS — Z9981 Dependence on supplemental oxygen: Secondary | ICD-10-CM | POA: Diagnosis not present

## 2020-08-04 DIAGNOSIS — N2581 Secondary hyperparathyroidism of renal origin: Secondary | ICD-10-CM | POA: Diagnosis present

## 2020-08-04 DIAGNOSIS — L89152 Pressure ulcer of sacral region, stage 2: Secondary | ICD-10-CM | POA: Diagnosis present

## 2020-08-04 DIAGNOSIS — D7589 Other specified diseases of blood and blood-forming organs: Secondary | ICD-10-CM | POA: Diagnosis present

## 2020-08-04 DIAGNOSIS — I132 Hypertensive heart and chronic kidney disease with heart failure and with stage 5 chronic kidney disease, or end stage renal disease: Secondary | ICD-10-CM | POA: Diagnosis present

## 2020-08-04 DIAGNOSIS — Z79899 Other long term (current) drug therapy: Secondary | ICD-10-CM | POA: Diagnosis not present

## 2020-08-04 DIAGNOSIS — E1122 Type 2 diabetes mellitus with diabetic chronic kidney disease: Secondary | ICD-10-CM | POA: Diagnosis present

## 2020-08-04 LAB — CBC
HCT: 30.8 % — ABNORMAL LOW (ref 36.0–46.0)
Hemoglobin: 9.3 g/dL — ABNORMAL LOW (ref 12.0–15.0)
MCH: 32.4 pg (ref 26.0–34.0)
MCHC: 30.2 g/dL (ref 30.0–36.0)
MCV: 107.3 fL — ABNORMAL HIGH (ref 80.0–100.0)
Platelets: 168 10*3/uL (ref 150–400)
RBC: 2.87 MIL/uL — ABNORMAL LOW (ref 3.87–5.11)
RDW: 15.2 % (ref 11.5–15.5)
WBC: 5.4 10*3/uL (ref 4.0–10.5)
nRBC: 0 % (ref 0.0–0.2)

## 2020-08-04 LAB — BASIC METABOLIC PANEL
Anion gap: 11 (ref 5–15)
BUN: 26 mg/dL — ABNORMAL HIGH (ref 8–23)
CO2: 27 mmol/L (ref 22–32)
Calcium: 7.4 mg/dL — ABNORMAL LOW (ref 8.9–10.3)
Chloride: 101 mmol/L (ref 98–111)
Creatinine, Ser: 3.81 mg/dL — ABNORMAL HIGH (ref 0.44–1.00)
GFR calc Af Amer: 12 mL/min — ABNORMAL LOW (ref >60)
GFR calc non Af Amer: 10 mL/min — ABNORMAL LOW (ref >60)
Glucose, Bld: 128 mg/dL — ABNORMAL HIGH (ref 70–99)
Potassium: 4 mmol/L (ref 3.5–5.1)
Sodium: 139 mmol/L (ref 135–145)

## 2020-08-04 MED ORDER — MIDODRINE HCL 5 MG PO TABS
10.0000 mg | ORAL_TABLET | ORAL | Status: DC
  Administered 2020-08-08 – 2020-08-10 (×2): 10 mg via ORAL
  Filled 2020-08-04: qty 2

## 2020-08-04 MED ORDER — MIDODRINE HCL 5 MG PO TABS: 10.0000 mg | ORAL_TABLET | ORAL | Status: DC

## 2020-08-04 MED ORDER — CHLORHEXIDINE GLUCONATE CLOTH 2 % EX PADS
6.0000 | MEDICATED_PAD | Freq: Every day | CUTANEOUS | Status: DC
  Administered 2020-08-06: 6 via TOPICAL

## 2020-08-04 NOTE — Consult Note (Signed)
[facility] Renal Consultation Note    Indication for Consultation:  Management of ESRD/hemodialysis, anemia, hypertension/volume, and secondary hyperparathyroidism.  HPI: [name] James is a 84 y.o. female with PMH including ESRD on dialysis, a. Fub, CHF, T2DM, HTN, and Barrett esophagus who presented to the ED on 08/03/20 from her dialysis center with a. Fib RVR. She was approximately 40 minutes into treatment when she developed chest discomfort and heart rate in the 140s-150s with hypotension. Treatment was terminated after 1 hour 32 min and patient was sent to the ED, only 300 cc fluid removed. Prior to this, patient presented to the ED 08/01/20 with a.fib RVR. Metoprolol was discontinued at that time and patient was continued on amiodarone. She missed HD 8/11 due to ED visit, last full treatment was 07/30/20. In the ED, BP 80s/60s. Patient was given 1L IVF.  She was given a dose of lopressor which controlled her heart rate. Labs remarkable for K+ 4.0, Na 139, BUN 26, Cr 3.8., WBC 5.4, Hgb 9.3, Plt 168. CXR showed small bilateral pleural effusions. Patient did see a pulmonologist on 08/02/20 and was diagnosis with cough thought to be due to nasal congestion. She was started on ipratropium at that time.   At present, patient reports she feels "full of fluid." Reports shortness of breath that is unchanged from her baseline. She feels she needs dialysis. Also endorses significant fatigue with minimal exertion for the past several days. Denies CP, dizziness, palpitations, abdominal pain, N/V/D. Reports a good appetite for the past few days. She feels she did not get the correct medications at her SNF- I believe she is referring to metoprolol which was discontinued at her last ED visit. BP in the low 100s/60s on monitor with HR in the 80's.  Past Medical History:  Diagnosis Date  . Anemia, chronic disease   . Anxiety   . Arthritis    knees  . Atrial fibrillation ([place])   . Barrett esophagus    . Benign paroxysmal positional vertigo   . Breast cancer ([place]) 1980s   bil mastectomies, no radiation or chemo  . Cataract   . Chest pain, atypical 12/05/2008   R/Lmv- normal perfusion all regions, noe ECG changes   . CHF (congestive heart failure) ([place]) 05/13/2011   echo - AC>16%; stage 1 diastolic dysfunction; elevated LV filling pressure, MAC  . CKD (chronic kidney disease), stage III   . Claudication ([place]) 10/30/2005   doppler - normal evaluation, no evidence of aneurysm, diameter reduction, dissection, compression or vascular abnormality)  . Complication of anesthesia   . Controlled type 2 diabetes mellitus with diabetic polyneuropathy, with long-term current use of insulin ([place]) 04/17/2016  . Dyslipidemia   . Dysrhythmia    Atrial Fibrillation  . Edema   . ESRD (end stage renal disease) on dialysis ([place])    "[facility]; [address]; MWF" (07/06/2018)  . Family history of adverse reaction to anesthesia    son also has nausea   . Fever blister 12/2016  . Gastritis and gastroduodenitis   . GERD (gastroesophageal reflux disease)   . Gout   . Hemodialysis-associated hypotension   . HLD (hyperlipidemia)   . Hypertension   . IBS (irritable bowel syndrome)   . Insomnia   . Iron deficiency anemia   . Junctional bradycardia 08/25/2018  . LFT elevation   . Long term current use of amiodarone 10/17/2016  . Morbid (severe) obesity due to excess calories ([place]) 04/29/2017  . Morbid obesity ([place])   .  On home oxygen therapy    "2L; 24/7" (07/06/2018)  . PAF (paroxysmal atrial fibrillation) ([place])   . Peripheral neuropathy   . Pneumonia 12/2016  . PONV (postoperative nausea and vomiting)   . Renal cyst   . Renal insufficiency 10/30/2005   doppler - abn resistance consistent w/ parenchymal disease  . Renovascular hypertension   . Type 2 diabetes mellitus with diabetic neuropathy, with long-term current use of insulin ([place])   . Vertigo    Past Surgical History:  Procedure Laterality Date  .  A/V FISTULAGRAM Left 03/19/2018   Procedure: A/V FISTULAGRAM;  Surgeon: Angelia Mould, MD;  Location: [place] [facility];  Service: Cardiovascular;  Laterality: Left;  . A/V FISTULAGRAM N/A 12/24/2018   Procedure: A/V FISTULAGRAM - Right Arm;  Surgeon: Angelia Mould, MD;  Location: [place] [facility];  Service: Cardiovascular;  Laterality: N/A;  . A/V FISTULAGRAM Right 03/29/2019   Procedure: A/V FISTULAGRAM;  Surgeon: Angelia Mould, MD;  Location: [place] [facility] LAB;  Service: Cardiovascular;  Laterality: Right;  . A/V FISTULAGRAM Right 02/03/2020   Procedure: A/V FISTULAGRAM;  Surgeon: Angelia Mould, MD;  Location: [place] [facility];  Service: Cardiovascular;  Laterality: Right;  . A/V SHUNTOGRAM Left 01/19/2018   Procedure: A/V SHUNTOGRAM;  Surgeon: Serafina Mitchell, MD;  Location: [place] [facility];  Service: Cardiovascular;  Laterality: Left;  . ANGIOPLASTY Left 02/12/2015   Procedure: ANGIOPLASTY;  Surgeon: Angelia Mould, MD;  Location: [place] CATH LAB;  Service: Cardiovascular;  Laterality: Left;  AVF  . AV FISTULA PLACEMENT Left 11/30/2014   Procedure: ARTERIOVENOUS (AV) FISTULA CREATION LEFT ARM;  Surgeon: Angelia Mould, MD;  Location: [place];  Service: Vascular;  Laterality: Left;  . AV FISTULA PLACEMENT Left 03/20/2015   Procedure:  Inserton of Left Upper Arm Gortex Graft;  Surgeon: Angelia Mould, MD;  Location: [place];  Service: Vascular;  Laterality: Left;  . AV FISTULA PLACEMENT Left 04/12/2015   Procedure: INSERTION OF LEFT ARM  ARTERIOVENOUS GORE-TEX GRAFT ;  Surgeon: Angelia Mould, MD;  Location: [place];  Service: Vascular;  Laterality: Left;  . AV FISTULA PLACEMENT Right 06/08/2018   Procedure: INSERTION OF ARTERIOVENOUS (AV) GORE-TEX GRAFT ARM USING 4-7MM X 45CM GORETEX GRAFT;  Surgeon: Angelia Mould, MD;  Location: [place];  Service: Vascular;  Laterality: Right;  . BACK SURGERY  ~1980  . CATARACT EXTRACTION Right    . CHOLECYSTECTOMY    . ESOPHAGOGASTRODUODENOSCOPY N/A 10/24/2014   Procedure: ESOPHAGOGASTRODUODENOSCOPY (EGD);  Surgeon: Jerene Bears, MD;  Location: [facility] ENDOSCOPY;  Service: Endoscopy;  Laterality: N/A;  . EXCHANGE OF A DIALYSIS CATHETER Left 11/30/2014   Procedure: EXCHANGE OF A DIALYSIS CATHETER, LEFT INTERNAL JUGULAR;  Surgeon: Angelia Mould, MD;  Location: [place];  Service: Vascular;  Laterality: Left;  . EYE SURGERY Left    transplant  . FISTULOGRAM N/A 02/12/2015   Procedure: FISTULOGRAM;  Surgeon: Angelia Mould, MD;  Location: [facility] CATH LAB;  Service: Cardiovascular;  Laterality: N/A;  . HEMORRHOID SURGERY    . IR AV DIALY SHUNT INTRO NEEDLE/INTRACATH INITIAL W/PTA/IMG RIGHT Right 08/25/2018  . IR AV DIALY SHUNT INTRO NEEDLE/INTRACATH INITIAL W/PTA/IMG RIGHT Right 11/17/2018  . IR US GUIDE VASC ACCESS RIGHT  08/25/2018  . LIGATION ARTERIOVENOUS GORTEX GRAFT Left 08/27/2018   Procedure: LIGATION ARTERIOVENOUS GORTEX GRAFT ARM;  Surgeon: Angelia Mould, MD;  Location: [place];  Service: Vascular;  Laterality: Left;  . LIGATION OF  ARTERIOVENOUS  FISTULA Left 03/20/2015   Procedure: LIGATION OF ARTERIOVENOUS  FISTULA;  Surgeon: Angelia Mould, MD;  Location: [place];  Service: Vascular;  Laterality: Left;  Marland Kitchen MASTECTOMY Bilateral ~1982   bil breast reconstruction with implants  . ORIF TIBIA & FIBULA FRACTURES Left 2007   also had left non displaced malleolar fracture.   Marland Kitchen PERIPHERAL VASCULAR BALLOON ANGIOPLASTY Left 01/19/2018   Procedure: PERIPHERAL VASCULAR BALLOON ANGIOPLASTY;  Surgeon: Serafina Mitchell, MD;  Location: [place] CV LAB;  Service: Cardiovascular;  Laterality: Left;  fistula  . PERIPHERAL VASCULAR BALLOON ANGIOPLASTY Left 03/19/2018   Procedure: PERIPHERAL VASCULAR BALLOON ANGIOPLASTY;  Surgeon: Angelia Mould, MD;  Location: [place] CV LAB;  Service: Cardiovascular;  Laterality: Left;  UPPER ARM FISTULA  . PERIPHERAL VASCULAR BALLOON  ANGIOPLASTY Right 12/24/2018   Procedure: PERIPHERAL VASCULAR BALLOON ANGIOPLASTY;  Surgeon: Angelia Mould, MD;  Location: [place] [facility] LAB;  Service: Cardiovascular;  Laterality: Right;  arm fistula  . PERIPHERAL VASCULAR BALLOON ANGIOPLASTY  03/29/2019   Procedure: PERIPHERAL VASCULAR BALLOON ANGIOPLASTY;  Surgeon: Angelia Mould, MD;  Location: [place] [facility] LAB;  Service: Cardiovascular;;  . PERIPHERAL VASCULAR BALLOON ANGIOPLASTY Right 02/03/2020   Procedure: PERIPHERAL VASCULAR BALLOON ANGIOPLASTY;  Surgeon: Angelia Mould, MD;  Location: [place] CV LAB;  Service: Cardiovascular;  Laterality: Right;  upper arm fistula  . PERIPHERAL VASCULAR BALLOON ANGIOPLASTY  06/22/2020   Procedure: PERIPHERAL VASCULAR BALLOON ANGIOPLASTY;  Surgeon: Angelia Mould, MD;  Location: [place] CV LAB;  Service: Cardiovascular;;  Right AVF  . REMOVAL OF GRAFT Left 04/12/2015   Procedure: REMOVAL OF LEFT ARM ARTERIOVENOUS GORE-TEX GRAFT;  Surgeon: Angelia Mould, MD;  Location: [place];  Service: Vascular;  Laterality: Left;  . REVISION OF ARTERIOVENOUS GORETEX GRAFT Right 12/13/2019   Procedure: REVISION OF ARTERIOVENOUS GORETEX GRAFT RIGHT FOREARM;  Surgeon: Angelia Mould, MD;  Location: [facility] OR;  Service: Vascular;  Laterality: Right;   Family History  Problem Relation Age of Onset  . Diabetes Mother   . Heart disease Mother   . Non-Hodgkin's lymphoma Mother   . Heart attack Father   . Stroke Father   . Parkinson's disease Brother   . Lung disease Brother   . Heart disease Brother   . Multiple myeloma Sister    Social History:  reports that she has never smoked. She has never used smokeless tobacco. She reports that she does not drink alcohol and does not use drugs.  ROS: As per HPI otherwise negative.   Physical Exam: Vitals:   08/03/20 2200 08/03/20 2228 08/04/20 0422 08/04/20 0818  BP:  (!) 93/50 (!) 92/45 (!) 101/57  Pulse: 71 73 72 70  Resp:  _0 Temp:  98 F (36.7 C) 98.2 F (36.8 C) (!) 97.4 F (36.3 C)  TempSrc:  Oral Oral Oral  SpO2: 100% 100% 99% 99%  Weight:   59.4 kg      General: Well developed, frail appearing female, in no acute distress. Head: Normocephalic, atraumatic, sclera non-icteric, mucus membranes are moist. Neck: Supple without lymphadenopathy/masses. JVD not elevated. Lungs: Decreased breath sounds bilaterally. No wheezing, rhonchi or rales auscultated. On O2 2L via nasal cannula.  Heart: RRR with normal S1, S2. No murmurs, rubs, or gallops appreciated. Abdomen: Soft, non-tender, non-distended with normoactive bowel sounds. No rebound/guarding. No obvious abdominal masses. Musculoskeletal:  Strength and tone appear normal for age. Lower extremities: No edema or ischemic changes Neuro: Alert  and oriented X 3. Moves all extremities spontaneously. Psych:  Responds to questions appropriately with a normal affect. Dialysis Access: RUE AVG + thrill/bruit  Allergies  Allergen Reactions  . Erythromycin Swelling  . Codeine Palpitations, Rash and Other (See Comments)    GI Upset  . Erythromycin Base Other (See Comments)    GI Upset  . Penicillins Rash and Other (See Comments)    Has taken Keflex & Rocephin many times without problems Documented on MAR  . Zantac [Ranitidine Hcl] Nausea And Vomiting   Prior to Admission medications   Medication Sig Start Date End Date Taking? Authorizing Provider  acetaminophen (TYLENOL) 325 MG tablet Take 650 mg by mouth every 6 (six) hours as needed for fever (greater than 100 F).    Yes [provider]  allopurinol (ZYLOPRIM) 100 MG tablet Take 100 mg by mouth daily.    Yes [provider]  amiodarone (PACERONE) 200 MG tablet Take 1 tablet (200 mg total) by mouth 2 (two) times daily. 08/01/20  Yes Drenda Freeze, MD  aspirin 81 MG chewable tablet Chew 81 mg by mouth daily.    Yes [provider]  azelastine (ASTELIN) 0.1 % nasal  spray Place 2 sprays into both nostrils 2 (two) times daily. 08/01/20  Yes [provider]  B Complex-C-Folic Acid (NEPHRO-VITE PO) Take 1 tablet by mouth daily.   Yes [provider]  Brinzolamide-Brimonidine (SIMBRINZA) 1-0.2 % SUSP Place 1 drop into both eyes 2 (two) times daily. (0900 & 2100)   Yes [provider]  calcium carbonate (TUMS - DOSED IN MG ELEMENTAL CALCIUM) 500 MG chewable tablet Chew 1 tablet by mouth 2 (two) times daily.   Yes [provider]  cetirizine (ZYRTEC) 10 MG tablet Take 10 mg by mouth daily.   Yes [provider]  cholestyramine (QUESTRAN) 4 g packet Take 1 packet (4 g total) by mouth 2 (two) times daily. Take twice daily before breakfast and dinner Patient taking differently: Take 4 g by mouth 2 (two) times daily as needed (removal of bile acids.).  10/13/19  Yes Lemmon, Lavone Nian, PA  diazepam (VALIUM) 5 MG tablet Take 1 tablet (5 mg total) by mouth at bedtime. 07/08/18  Yes Arrien, Jimmy Picket, MD  diclofenac Sodium (VOLTAREN) 1 % GEL Apply 2 g topically See admin instructions. Apply 2 grams to the right knee at bedtime 06/04/20  Yes [provider]  DIMETHICONE, TOPICAL, (SECURA DIMETHICONE PROTECTANT) 5 % CREA Apply 1 application topically See admin instructions. Apply a nickel-sized amount onto the buttocks area 2 times a day for barrier protection   Yes [provider]  diphenoxylate-atropine (LOMOTIL) 2.5-0.025 MG tablet Take 1 tablet by mouth See admin instructions. Take 1 tablet by mouth prior to dialysis on Mon/Wed/Fri and up to 4 times a day as needed for diarrhea or loose stools   Yes [provider]  fluticasone (FLONASE) 50 MCG/ACT nasal spray Place 2 sprays into both nostrils daily.    Yes [provider]  ipratropium (ATROVENT) 0.03 % nasal spray Place 2 sprays into both nostrils every 12 (twelve) hours. 08/02/20  Yes Hunsucker, Bonna Gains, MD  loperamide (IMODIUM A-D) 2  MG tablet Take 2 tablets (4 mg total) by mouth every 8 (eight) hours as needed for diarrhea or loose stools (for diarrhea). Patient taking differently: Take 4 mg by mouth See admin instructions. Take 4 mg by mouth on Mon/Wed/Fri prior to dialysis- may refuse, if not not needed AND  every eight hours as needed for diarrhea or loose stools 03/01/19  Yes Pyrtle, Lajuan Lines, MD  Melatonin 5 MG CAPS Take 10 mg by mouth at bedtime.    Yes [provider]  Menthol, Topical Analgesic, (BIOFREEZE) 4 % GEL Apply 1 application topically 3 (three) times daily as needed (bilateral knee pain).   Yes [provider]  midodrine (PROAMATINE) 2.5 MG tablet Take 2.5 mg by mouth See admin instructions. Take 2.5 mg by mouth two times a day if B/P is <120/60   Yes [provider]  SALINE NASAL MIST NA Place 2 sprays into the nose 3 (three) times daily.   Yes [provider]  traZODone (DESYREL) 50 MG tablet Take 50 mg by mouth at bedtime.    Yes [provider]  B Complex-C-Folic Acid (NEPHRO VITAMINS) 0.8 MG TABS Take 1 tablet by mouth daily. (0900) Patient not taking: Reported on 08/03/2020    [provider]  DIMETHICONE, TOPICAL, 5 % CREA Apply 1 application topically See admin instructions. Place a nickel-sized amount onto buttock(s) area 2 times a day for barrier protection Patient not taking: Reported on 08/03/2020    [provider]  OXYGEN Inhale 2 L into the lungs continuous.     [provider]   Current Facility-Administered Medications  Medication Dose Route Frequency Provider Last Rate Last Admin  . acetaminophen (TYLENOL) tablet 650 mg  650 mg Oral Q4H PRN Gala Romney L, MD      . allopurinol (ZYLOPRIM) tablet 100 mg  100 mg Oral Daily Gala Romney L, MD      . amiodarone (PACERONE) tablet 200 mg  200 mg Oral BID Gala Romney L, MD   200 mg at 08/03/20 2331  . aspirin chewable tablet 81 mg  81 mg Oral Daily Garba, Mohammad L, MD       . azelastine (ASTELIN) 0.1 % nasal spray 2 spray  2 spray Each Nare BID Elwyn Reach, MD   2 spray at 08/03/20 2333  . calcium carbonate (TUMS - dosed in mg elemental calcium) chewable tablet 200 mg of elemental calcium  1 tablet Oral BID Gala Romney L, MD      . cholestyramine (QUESTRAN) packet 4 g  4 g Oral BID PRN Gala Romney L, MD      . diazepam (VALIUM) tablet 5 mg  5 mg Oral QHS Elwyn Reach, MD   5 mg at 08/03/20 2331  . diclofenac Sodium (VOLTAREN) 1 % topical gel 2 g  2 g Topical QHS Elwyn Reach, MD   2 g at 08/03/20 2334  . dimethicone 1 % cream 1 application  1 application Topical BID Elwyn Reach, MD      . Derrill Memo ON 08/06/2020] diphenoxylate-atropine (LOMOTIL) 2.5-0.025 MG per tablet 1 tablet  1 tablet Oral Q M,W,F-HD Garba, Mohammad L, MD      . fluticasone (FLONASE) 50 MCG/ACT nasal spray 2 spray  2 spray Each Nare Daily Garba, Mohammad L, MD      . ipratropium (ATROVENT) 0.06 % nasal spray 2 spray  2 spray Each Nare Q12H Elwyn Reach, MD      . Derrill Memo ON 08/06/2020] loperamide (IMODIUM) capsule 4 mg  4 mg Oral Q M,W,F-HD Jonelle Sidle, Mohammad L, MD      . loratadine (CLARITIN) tablet 10 mg  10 mg Oral Daily Garba, Mohammad L, MD      . melatonin tablet 10 mg  10 mg Oral QHS  Elwyn Reach, MD   10 mg at 08/03/20 2331  . midodrine (PROAMATINE) tablet 2.5 mg  2.5 mg Oral BID PRN Elwyn Reach, MD      . multivitamin (RENA-VIT) tablet 1 tablet  1 tablet Oral QHS Elwyn Reach, MD   1 tablet at 08/03/20 2331  . ondansetron (ZOFRAN) injection 4 mg  4 mg Intravenous Q6H PRN Elwyn Reach, MD      . traZODone (DESYREL) tablet 50 mg  50 mg Oral QHS Elwyn Reach, MD   50 mg at 08/03/20 2331   Labs: Basic Metabolic Panel: Recent Labs  Lab 08/01/20 1052 08/01/20 1052 08/01/20 1115 08/03/20 1450 08/04/20 0342  NA 140   < > 140 138 139  K 3.2*   < > 3.1* 4.5 4.0  CL 98   < > 97* 100 101  CO2 29  --   --  25 27  GLUCOSE 106*   < > 100*  105* 128*  BUN 22   < > 24* 21 26*  CREATININE 3.63*   < > 3.40* 3.32* 3.81*  CALCIUM 8.3*  --   --  7.5* 7.4*   < > = values in this interval not displayed.   Liver Function Tests: Recent Labs  Lab 08/01/20 1052  AST 24  ALT 13  ALKPHOS 81  BILITOT 0.8  PROT 6.9  ALBUMIN 2.7*   CBC: Recent Labs  Lab 08/01/20 1052 08/01/20 1052 08/01/20 1115 08/03/20 1450 08/04/20 0342  WBC 6.5  --   --  7.4 5.4  NEUTROABS 4.5  --   --   --   --   HGB 11.1*   < > 12.9 10.8* 9.3*  HCT 37.3   < > 38.0 35.8* 30.8*  MCV 108.1*  --   --  107.5* 107.3*  PLT 177  --   --  184 168   < > = values in this interval not displayed.   Studies/Results: DG Chest Port 1 View  Result Date: 08/03/2020 CLINICAL DATA:  Dyspnea, new onset atrial fibrillation EXAM: PORTABLE CHEST 1 VIEW COMPARISON:  08/01/2020 FINDINGS: Small left pleural effusion with associated left basilar compressive atelectasis and retrocardiac opacification is unchanged. Tiny right pleural effusion is suspected, unchanged. Lung volumes are small, but symmetric. No superimposed focal pulmonary infiltrate. No pneumothorax. Cardiac size within normal limits. No acute bone abnormality. IMPRESSION: 1. Unchanged small left pleural effusion with associated left basilar compressive atelectasis and retrocardiac opacification. 2. Probable tiny right pleural effusion, unchanged. 3. Progressive pulmonary hypoinflation. Electronically Signed   By: Fidela Salisbury MD   On: 08/03/2020 15:36    Dialysis Orders:  Center: [facility]  on MWF. Time: 3 hours 45 min, 3K, 2.25Ca, BFR 400, DFR 800, EDW 54.5kg, RUE AVG, heparin 3300 unit bolus Mircera 30 mcg IVP q 2 weeks- last dose 07/23/20 Hectorol 1 mcg IV q HD  Assessment/Plan: 1.  A fib with RVR: Tachycardic and hypotensive at HD on 08/03/20, presented to ED on 08/01/20 with similar symptoms. On amiodarone. Received 1L fluids and lopressor. Currently in regular rhythm with rate controlled, BP improved. Per  primary. Chronically low potassium, Keep K+ >4 2.  ESRD:  MWF schedule, only had 1.5 treatments this week and feels volume overloaded after receiving 1L fluids. Currently 4.9L above her EDW. Suspect hypotension was due to a.fib RVR more than volume depletion as only 300 cc removed total during her treatment. Will plan for short extra HD today with  UF as tolerated.  3.  Hypertension/volume: BP soft with fluid overload as above, on amiodarone, plan for short extra HD today. Takes midodrine 35m before dialysis and another 16mmid treatment as needed, will give midodrine pre-HD here.  4.  Anemia: Hgb 9.2, on ESA, next dose due 8/16. No blood loss reported 5.  Metabolic bone disease: Corrected calcium 8.4. Will use added Ca bath with HD. Continue hectorol. Continue Tums. Not on additional phosphorus binder.  6.  Nutrition:  Albumin low, add pro-stat supplement.   SaAnice PaganiniPA-C 08/04/2020, 9:11 AM  [facility] Pager: [phone]

## 2020-08-04 NOTE — Progress Notes (Signed)
Patient ID: [name] James, female   DOB: 1936-01-04, 84 y.o.   MRN: [id number]  PROGRESS NOTE    TAWYNA PELLOT  WFU:[id number] DOB: 13-Sep-1936 DOA: 08/03/2020 PCP: Patrick Jupiter   Brief Narrative:  84 year old female with history of ESRD on hemodialysis, GERD, paroxysmal A. fib, hyperlipidemia, hypertension, diabetes mellitus type 2 presented with chest discomfort and palpitation on 08/03/2020 when she was having hemodialysis.  She was sent to the ER before completing her hemodialysis.  In the ED, her heart rate was in the 130s to 140s for which she was given Lopressor.  Subsequently she was hypotensive and placed in observation.  Nephrology was consulted.  Chest x-ray showed bilateral minimal pleural effusion but no overt pulmonary edema.  Assessment & Plan:   Paroxysmal A. fib with RVR -Presented with heart rate in the 130s to 140s for which she required Lopressor in the ED.  Currently rate controlled.  Patient was recently seen by Dr. Caryl Comes in the ED on 08/01/2020 and she was started on amiodarone 200 mg twice a day.  This will be continued.  Hypotension -Blood pressure in the 80s on presentation.  Currently blood pressure on the lower side but improved to 90s-100s.  Continue midodrine.  Nephrology is planning to dialyze her today.  ESRD on hemodialysis -Nephrology following.  Planning for hemodialysis today.  Anemia of chronic disease -From renal failure.  Hemoglobin stable.  Monitor  Chronic macrocytosis -Questionable cause.  Check TSH, vitamin B12 and folate levels in a.m.  Chronic diastolic CHF -Strict input and output.  Daily weights.  Volume managed by dialysis  Diabetes mellitus type 2 -Continue CBGs with SSI  Chronic hypoxic respiratory failure -Continue supplemental oxygen.  Currently on 2 L oxygen.  Generalized deconditioning -Patient looks very deconditioned and chronically ill.  PT eval.  Palliative care evaluation for goals of care discussion.  Discussed CODE  STATUS with the patient and recommended to consider changing CODE STATUS to DNR.  She will think about it.   DVT prophylaxis: SCDs Code Status: Full Family Communication: None at bedside Disposition Plan: Status is: Observation  The patient remains OBS appropriate and will d/c before 2 midnights.  If patient tolerates hemodialysis today without hypotension and A. fib, she might be discharged later today but if she becomes hypotensive again, might have to keep her in the hospital longer  Dispo: The patient is from: SNF              Anticipated d/c is to: SNF              Anticipated d/c date is: 1 day              Patient currently is not medically stable to d/c.   Consultants: Nephrology.  Will consult palliative care  Procedures: None  Antimicrobials: None   Subjective: Patient seen and examined at bedside.  Poor historian.  Feels very weak.  Denies current chest pain or palpitations.  No overnight fever or vomiting reported.  Objective: Vitals:   08/03/20 2200 08/03/20 2228 08/04/20 0422 08/04/20 0818  BP:  (!) 93/50 (!) 92/45 (!) 101/57  Pulse: 71 73 72 70  Resp: 18 15 16 17   Temp:  98 F (36.7 C) 98.2 F (36.8 C) (!) 97.4 F (36.3 C)  TempSrc:  Oral Oral Oral  SpO2: 100% 100% 99% 99%  Weight:   59.4 kg    No intake or output data in the 24 hours ending 08/04/20 [place]  08/04/20 0422  Weight: 59.4 kg    Examination:  General exam: Appears calm and comfortable.  Looks chronically ill.   Respiratory system: Bilateral decreased breath sounds at bases with scattered crackles Cardiovascular system: S1 & S2 heard, Rate controlled Gastrointestinal system: Abdomen is nondistended, soft and nontender. Normal bowel sounds heard. Extremities: No cyanosis, clubbing; trace lower extremity edema Central nervous system: Awake and alert.  Poor historian. No focal neurological deficits. Moving extremities Skin: No rashes, lesions or ulcers Psychiatry: Looks  anxious.   Data Reviewed: I have personally reviewed following labs and imaging studies  CBC: Recent Labs  Lab 08/01/20 1052 08/01/20 1115 08/03/20 1450 08/04/20 0342  WBC 6.5  --  7.4 5.4  NEUTROABS 4.5  --   --   --   HGB 11.1* 12.9 10.8* 9.3*  HCT 37.3 38.0 35.8* 30.8*  MCV 108.1*  --  107.5* 107.3*  PLT 177  --  184 355   Basic Metabolic Panel: Recent Labs  Lab 08/01/20 1052 08/01/20 1115 08/01/20 1302 08/01/20 1639 08/03/20 1450 08/04/20 0342  NA 140 140  --   --  138 139  K 3.2* 3.1*  --   --  4.5 4.0  CL 98 97*  --   --  100 101  CO2 29  --   --   --  25 27  GLUCOSE 106* 100*  --   --  105* 128*  BUN 22 24*  --   --  21 26*  CREATININE 3.63* 3.40*  --   --  3.32* 3.81*  CALCIUM 8.3*  --   --   --  7.5* 7.4*  MG  --   --  1.9 2.0 1.9  --    GFR: Estimated Creatinine Clearance: 10.2 mL/min (A) (by C-G formula based on SCr of 3.81 mg/dL (H)). Liver Function Tests: Recent Labs  Lab 08/01/20 1052  AST 24  ALT 13  ALKPHOS 81  BILITOT 0.8  PROT 6.9  ALBUMIN 2.7*   No results for input(s): LIPASE, AMYLASE in the last 168 hours. No results for input(s): AMMONIA in the last 168 hours. Coagulation Profile: No results for input(s): INR, PROTIME in the last 168 hours. Cardiac Enzymes: No results for input(s): CKTOTAL, CKMB, CKMBINDEX, TROPONINI in the last 168 hours. BNP (last 3 results) No results for input(s): PROBNP in the last 8760 hours. HbA1C: No results for input(s): HGBA1C in the last 72 hours. CBG: No results for input(s): GLUCAP in the last 168 hours. Lipid Profile: No results for input(s): CHOL, HDL, LDLCALC, TRIG, CHOLHDL, LDLDIRECT in the last 72 hours. Thyroid Function Tests: Recent Labs    08/01/20 1637  TSH 0.947   Anemia Panel: No results for input(s): VITAMINB12, FOLATE, FERRITIN, TIBC, IRON, RETICCTPCT in the last 72 hours. Sepsis Labs: No results for input(s): PROCALCITON, LATICACIDVEN in the last 168 hours.  Recent Results  (from the past 240 hour(s))  SARS Coronavirus 2 by RT PCR (hospital order, performed in [facility] hospital lab) Nasopharyngeal Nasopharyngeal Swab     Status: None   Collection Time: 08/03/20  8:10 PM   Specimen: Nasopharyngeal Swab  Result Value Ref Range Status   SARS Coronavirus 2 NEGATIVE NEGATIVE Final    Comment: (NOTE) SARS-CoV-2 target nucleic acids are NOT DETECTED.  The SARS-CoV-2 RNA is generally detectable in upper and lower respiratory specimens during the acute phase of infection. The lowest concentration of SARS-CoV-2 viral copies this assay can detect is 250 copies / mL. A  negative result does not preclude SARS-CoV-2 infection and should not be used as the sole basis for treatment or other patient management decisions.  A negative result may occur with improper specimen collection / handling, submission of specimen other than nasopharyngeal swab, presence of viral mutation(s) within the areas targeted by this assay, and inadequate number of viral copies (<250 copies / mL). A negative result must be combined with clinical observations, patient history, and epidemiological information.  Fact Sheet for Patients:   StrictlyIdeas.no  Fact Sheet for Healthcare Providers: BankingDealers.co.za  This test is not yet approved or  cleared by the [place] FDA and has been authorized for detection and/or diagnosis of SARS-CoV-2 by FDA under an Emergency Use Authorization (EUA).  This EUA will remain in effect (meaning this test can be used) for the duration of the COVID-19 declaration under Section 564(b)(1) of the Act, 21 U.S.C. section 360bbb-3(b)(1), unless the authorization is terminated or revoked sooner.  Performed at [facility], [place] [address] [place], [place] [address]          Radiology Studies: DG Chest Port 1 View  Result Date: 08/03/2020 CLINICAL DATA:  Dyspnea, new onset atrial fibrillation  EXAM: PORTABLE CHEST 1 VIEW COMPARISON:  08/01/2020 FINDINGS: Small left pleural effusion with associated left basilar compressive atelectasis and retrocardiac opacification is unchanged. Tiny right pleural effusion is suspected, unchanged. Lung volumes are small, but symmetric. No superimposed focal pulmonary infiltrate. No pneumothorax. Cardiac size within normal limits. No acute bone abnormality. IMPRESSION: 1. Unchanged small left pleural effusion with associated left basilar compressive atelectasis and retrocardiac opacification. 2. Probable tiny right pleural effusion, unchanged. 3. Progressive pulmonary hypoinflation. Electronically Signed   By: Fidela Salisbury MD   On: 08/03/2020 15:36        Scheduled Meds: . allopurinol  100 mg Oral Daily  . amiodarone  200 mg Oral BID  . aspirin  81 mg Oral Daily  . azelastine  2 spray Each Nare BID  . calcium carbonate  1 tablet Oral BID  . Chlorhexidine Gluconate Cloth  6 each Topical Q0600  . diazepam  5 mg Oral QHS  . diclofenac Sodium  2 g Topical QHS  . dimethicone  1 application Topical BID  . [START ON 08/06/2020] diphenoxylate-atropine  1 tablet Oral Q M,W,F-HD  . fluticasone  2 spray Each Nare Daily  . ipratropium  2 spray Each Nare Q12H  . [START ON 08/06/2020] loperamide  4 mg Oral Q M,W,F-HD  . loratadine  10 mg Oral Daily  . melatonin  10 mg Oral QHS  . [START ON 08/06/2020] midodrine  10 mg Oral Q M,W,F-HD  . [START ON 08/05/2020] midodrine  10 mg Oral Q Sun-HD  . multivitamin  1 tablet Oral QHS  . traZODone  50 mg Oral QHS   Continuous Infusions:        Aline August, MD [facility] 08/04/2020, 12:34 PM

## 2020-08-04 NOTE — TOC Initial Note (Signed)
Transition of Care [facility]) - Initial/Assessment Note    Patient Details  Name: [name] James MRN: [id number] Date of Birth: [birth year]  [facility] CM/SW Contact:    Claudie Leach, RN 08/04/2020, 6:00 PM  Clinical Narrative:                 Patient is resident of [facility] and asked that I discuss plans, decision with daughter, Kenney Houseman.  Kenney Houseman is a Marine scientist and is actively involved in patient's care.  She states she is concerned that patient does not hear or understand all information and she would like more communication from the staff here.    Kenney Houseman does not desire palliative services at this time or in the future.  She does not wish to discuss this further.     Expected Discharge Plan: [place] Barriers to Discharge: No Barriers Identified    Expected Discharge Plan and Services Expected Discharge Plan: [place] arrangements for the past 2 months: [place]        Prior Living Arrangements/Services Living arrangements for the past 2 months: [place] Lives with:: Facility Resident Patient language and need for interpreter reviewed:: No        Need for Family Participation in Patient Care: Yes (Comment) Care giver support system in place?: Yes (comment)   Criminal Activity/Legal Involvement Pertinent to Current Situation/Hospitalization: No - Comment as needed   Permission Sought/Granted      Share Information with NAME: Daughter, Coralyn Mark           Psych Involvement: No (comment)  Admission diagnosis:  Rapid atrial fibrillation ([place]) [I48.91] Atrial fibrillation with RVR ([place]) [I48.91] Hypotension [I95.9] Patient Active Problem List   Diagnosis Date Noted  . Rapid atrial fibrillation ([place]) 08/03/2020  . Nausea and vomiting 03/14/2019  . Chronic diarrhea 03/14/2019  . Dysuria 03/14/2019  . UTI (urinary tract infection) 03/14/2019  . Aortic atherosclerosis ([place])  12/30/2018  . Hypoalbuminemia due to protein-calorie malnutrition ([place]) 12/30/2018  . Sick sinus syndrome ([place])   . Symptomatic bradycardia 08/24/2018  . Gout 08/23/2018  . Anxiety 08/23/2018  . Pneumothorax, left 10/13/2017  . Multiple lung nodules on CT 10/06/2017  . Pleural effusion, left 10/06/2017  . Diabetic polyneuropathy associated with type 2 diabetes mellitus ([place]) 07/30/2017  . Onychomycosis of toenail 07/30/2017  . Idiopathic chronic venous hypertension of both lower extremities with inflammation 07/30/2017  . Chronic respiratory failure with hypoxia ([place]) 04/29/2017  . Advance care planning   . Goals of care, counseling/discussion   . Palliative care by specialist   . Pressure ulcer 01/19/2017  . History of pressure ulcer 10/17/2016  . GAD (generalized anxiety disorder) 10/17/2016  . Osteopenia determined by x-ray 04/17/2016  . Type 2 diabetes mellitus with diabetic neuropathy ([place]) 06/08/2015  . Type 2 diabetes, controlled, with renal manifestation ([place]) 06/08/2015  . PVD (peripheral vascular disease) ([place]) 06/08/2015  . Swelling of limb-Left arm 04/10/2015  . Nausea with vomiting 01/12/2015  . Normocytic anemia 01/12/2015  . Paroxysmal atrial fibrillation ([place]) 12/22/2014  . Melena   . ESRD on dialysis ([place]) 12/19/2014  . Chronic diastolic (congestive) heart failure ([place]) 12/19/2014  . Palpitation 12/19/2014  . Anemia, chronic disease 12/13/2014  . Hypotension 12/13/2014  . Pulmonary edema 11/10/2014  . Physical deconditioning 10/29/2014  . Gastritis and gastroduodenitis 10/24/2014  . Heme positive stool 10/24/2014  . Morbid obesity ([place]) 10/18/2014  . Edema 10/18/2014  .  Essential hypertension 10/17/2014  . DM type 2, uncontrolled, with renal complications ([place]) 89/34/0684  . Dyslipidemia 10/17/2014   PCP:  Place, [place]:   Rich Reining, [place] [address] Phone: [phone] Fax:  [phone]

## 2020-08-04 NOTE — Consult Note (Addendum)
Palliative Medicine Inpatient Consult Note  Reason for consult:  Goals of Care  HPI:  Per intake H&P --> 84 year old female with history of ESRD on hemodialysis, GERD, paroxysmal A. fib, hyperlipidemia, hypertension, diabetes mellitus type 2 presented with chest discomfort and palpitation on 08/03/2020 when she was having hemodialysis.  She was sent to the ER before completing her hemodialysis.  In the ED, her heart rate was in the 130s to 140s for which she was given Lopressor.  Subsequently she was hypotensive and placed in observation. Nephrology was consulted.  Patient is known to the Palliative care service as we were consulted to aid on [place] discussions in 2018 and 2019 at that point in time the patient was full code and full scope of care.  Clinical Assessment/Goals of Care: I have reviewed medical records including EPIC notes, labs and imaging, received report from bedside RN, assessed the patient.    I met with Nilda Calamity to further discuss diagnosis prognosis, GOC, EOL wishes, disposition and options.   I introduced Palliative Medicine as specialized medical care for people living with serious illness. It focuses on providing relief from the symptoms and stress of a serious illness. The goal is to improve quality of life for both the patient and the family.  I asked Pranathi to tell me about herself. She states that she is originally from [place]. She has lived in the [place] area for the last 37 four years. She has been married though is since divorced. She has an adopted son who she is estranged from. She has a biological son and daughter who are actively involved in her life. 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 She gets the greatest happiness out of spending time with her grandson who also lives locally. She is a Engineer, manufacturing.  In terms of Torria's living situation, she presently lives at [facility]. She shares that she made the decision to move there to not be a burden on her family. She has been there for the past five and a half years. She is reliant on aid of bADLs except feeding. She attests to being wheelchair bound.   I asked Kirsty how her health has been as of recently. She states that "it is going down." I asked her what she meant by that? She shares that for quite sometime her health has been poor. She states that most recently she believes her chest palpitations were in the setting of not receiving all of her needed medications. She shares that she has been on dialysis for many years now. She states that she has a social circle at her dialysis clinic and although she gets tired from the treatments she does wish to continue receiving them.   She goes on to share with me that at the present time she is not a fan of her present roommate. She shares a detailed story about why she does not enjoy being her roommate as she is not a good christian. I offered emotional support through therapeutic listening.   A detailed discussion was had today regarding advanced directives, patients daughter, Coralyn Mark a retired Marine scientist.    Concepts specific to code status, artifical feeding and hydration, continued IV antibiotics and rehospitalization was had. We reviewed the prior MOST form on file which states that she would want to be full code with full scope of treatment.  Lynsee shares that she does not think she would  want cardiopulmonary resuscitation or to be kept alive artifically on ventilatory support. She expresses apprehension about making this decision as her daughter, Kenney Houseman likes to be part of these conversations. I shared with Minnetta that if she feels that way we should try to schedule a meeting with Kenney Houseman where we can all talk about this openly and honestly.   Maddilynn expresses wanting to die peacefully in her sleep. We talk at length about what death and dying can look like. She reviews how  her mother passed away. I allow an open space for her to explore her feelings.   I shared with Sonam that no decisions need to be made right now, though it is an important conversation to have. If her goals are to die without suffering great trauma or to intubated then it's important she express this to her family so that they will know what direction to travel if and when the time comes.   Discussed the importance of continued conversation with family and their  medical providers regarding overall plan of care and treatment options, ensuring decisions are within the context of the patients values and GOCs.  We will continue to check in on Cristabel during her hospitalization.   Decision Maker: Patient can presently make decisions for herself though she personally would like her daughter, Coralyn Mark to be involved in decision making.  SUMMARY OF RECOMMENDATIONS   Full Code / Full Scope  Ongoing [place] conversations - Have reached out to patients daughter, Kenney Houseman to see if we may all have the opportunity to speak together  TOC - OP Palliative support  Spiritual support - Strong christian  Code Status/Advance Care Planning: FULL CODE   Palliative Prophylaxis:   Oral Care, Turn Q2H  Additional Recommendations (Limitations, Scope, Preferences):  Continue full scope of care   Psycho-social/Spiritual:   Desire for further Chaplaincy support: Yes  Additional Recommendations: Education on Palliative support and code status   Prognosis: Unclear  Discharge Planning: Discharge to [facility] place with OP palliative care  PPS: 30-40%   This conversation/these recommendations were discussed with patient primary care team, Dr. Starla Link  Time In: 1430 Time Out: 1600 Total Time: 90 Greater than 50%  of this time was spent counseling and coordinating care related to the above assessment and plan.  [place] Team Team Cell Phone: [phone] Please  utilize secure chat with additional questions, if there is no response within 30 minutes please call the above phone number  Palliative Medicine Team providers are available by phone from 7am to 7pm daily and can be reached through the team cell phone.  Should this patient require assistance outside of these hours, please call the patient's attending physician.

## 2020-08-04 NOTE — Care Management Obs Status (Signed)
[place] NOTIFICATION   Patient Details  Name: [name] James MRN: [id number] Date of Birth: [birth year]   Medicare Observation Status Notification Given:  Yes    Claudie Leach, RN 08/04/2020, 5:44 PM

## 2020-08-04 NOTE — Evaluation (Signed)
Physical Therapy Evaluation Patient Details Name: [name] James MRN: [id number] DOB: Feb 01, 1936 Today's Date: 08/04/2020   History of Present Illness  84 y.o. female with medical history significant of end-stage renal disease on hemodialysis Mondays Wednesdays and Fridays, GERD, atrial fibrillation, hyperlipidemia, hypertension, diabetes who was at hemodialysis today when she started having chest discomfort and noted to have A. fib with RVR. Pt also noted to be hypotensive.  Clinical Impression  Pt presents to PT at or near her functional baseline. Pt is able to roll in bed with minA, otherwise is a dependent transfer. PT does observe the pt feed herself prior to session and she requires assistance with all other ADLs at baseline. Pt requires no further acute PT services. Pt reports performing HEP at [facility] but states she has not had PT services recently. PT recommends a return to SNF, no PT or DME needs necessary. Acute PT signing off.    Follow Up Recommendations No PT follow up;SNF (return to facility)    Equipment Recommendations  None recommended by PT    Recommendations for Other Services       Precautions / Restrictions Precautions Precautions: Fall Restrictions Weight Bearing Restrictions: No      Mobility  Bed Mobility Overal bed mobility: Needs Assistance Bed Mobility: Rolling Rolling: Supervision;Min assist         General bed mobility comments: minA to roll left, supervision to roll right with use of rails  Transfers                 General transfer comment: deferred as the pt is a dependent transfer via hoyer lift at baseline  Ambulation/Gait                Stairs            Wheelchair Mobility    Modified Rankin (Stroke Patients Only)       Balance Overall balance assessment:  (deferred as pt does not sit unsupported at baseline)                                           Pertinent Vitals/Pain Pain  Assessment: No/denies pain    Home Living Family/patient expects to be discharged to:: Skilled nursing facility [facility])                      Prior Function Level of Independence: Needs assistance   Gait / Transfers Assistance Needed: pt rolls in bed, requiring some assistance to roll to left side. Pt transfers via hoyer lift and reports spending most of her time in the bed other than dialysis appointments  ADL's / Homemaking Assistance Needed: pt is able to feed herself but requires assist for all other ADLs        Hand Dominance   Dominant Hand: Right    Extremity/Trunk Assessment   Upper Extremity Assessment Upper Extremity Assessment: Generalized weakness (ROM WFL)    Lower Extremity Assessment Lower Extremity Assessment: RLE deficits/detail;LLE deficits/detail RLE Deficits / Details: grossly 3/5, knee flexion limited to ~30 degrees AROM and 90 degrees passive LLE Deficits / Details: grossly 3/5, knee flexion limited to ~20 degrees AROM and 70 degrees PROM, ankle PF contracture due to prior ankle fx lacking ~10 degrees active DF    Cervical / Trunk Assessment Cervical / Trunk Assessment: Kyphotic  Communication   Communication: No difficulties  Cognition Arousal/Alertness: Awake/alert Behavior During Therapy: WFL for tasks assessed/performed Overall Cognitive Status: Within Functional Limits for tasks assessed                                        General Comments General comments (skin integrity, edema, etc.): VSS on 2L Gerty    Exercises     Assessment/Plan    PT Assessment Patent does not need any further PT services  PT Problem List         PT Treatment Interventions      PT Goals (Current goals can be found in the Care Plan section)       Frequency     Barriers to discharge        Co-evaluation               AM-PAC PT "6 Clicks" Mobility  Outcome Measure Help needed turning from your back to your side while in a  flat bed without using bedrails?: A Little Help needed moving from lying on your back to sitting on the side of a flat bed without using bedrails?: Total Help needed moving to and from a bed to a chair (including a wheelchair)?: Total Help needed standing up from a chair using your arms (e.g., wheelchair or bedside chair)?: Total Help needed to walk in hospital room?: Total Help needed climbing 3-5 steps with a railing? : Total 6 Click Score: 8    End of Session Equipment Utilized During Treatment: Oxygen Activity Tolerance: Patient tolerated treatment well Patient left: in bed;with call bell/phone within reach;with bed alarm set Nurse Communication: Mobility status;Need for lift equipment      Time: 6160-7371 PT Time Calculation (min) (ACUTE ONLY): 19 min   Charges:   PT Evaluation $PT Eval Low Complexity: [place], PT, DPT Acute Rehabilitation Pager: [phone]   Zenaida Niece 08/04/2020, 12:10 PM

## 2020-08-05 DIAGNOSIS — I4891 Unspecified atrial fibrillation: Secondary | ICD-10-CM | POA: Diagnosis not present

## 2020-08-05 LAB — BASIC METABOLIC PANEL
Anion gap: 9 (ref 5–15)
BUN: 11 mg/dL (ref 8–23)
CO2: 28 mmol/L (ref 22–32)
Calcium: 7.7 mg/dL — ABNORMAL LOW (ref 8.9–10.3)
Chloride: 101 mmol/L (ref 98–111)
Creatinine, Ser: 2.33 mg/dL — ABNORMAL HIGH (ref 0.44–1.00)
GFR calc Af Amer: 22 mL/min — ABNORMAL LOW (ref >60)
GFR calc non Af Amer: 19 mL/min — ABNORMAL LOW (ref >60)
Glucose, Bld: 166 mg/dL — ABNORMAL HIGH (ref 70–99)
Potassium: 4.2 mmol/L (ref 3.5–5.1)
Sodium: 138 mmol/L (ref 135–145)

## 2020-08-05 LAB — CBC WITH DIFFERENTIAL/PLATELET
Abs Immature Granulocytes: 0.01 10*3/uL (ref 0.00–0.07)
Basophils Absolute: 0.1 10*3/uL (ref 0.0–0.1)
Basophils Relative: 1 %
Eosinophils Absolute: 0.3 10*3/uL (ref 0.0–0.5)
Eosinophils Relative: 4 %
HCT: 33.1 % — ABNORMAL LOW (ref 36.0–46.0)
Hemoglobin: 10.1 g/dL — ABNORMAL LOW (ref 12.0–15.0)
Immature Granulocytes: 0 %
Lymphocytes Relative: 17 %
Lymphs Abs: 1.3 10*3/uL (ref 0.7–4.0)
MCH: 32.2 pg (ref 26.0–34.0)
MCHC: 30.5 g/dL (ref 30.0–36.0)
MCV: 105.4 fL — ABNORMAL HIGH (ref 80.0–100.0)
Monocytes Absolute: 0.7 10*3/uL (ref 0.1–1.0)
Monocytes Relative: 10 %
Neutro Abs: 4.9 10*3/uL (ref 1.7–7.7)
Neutrophils Relative %: 68 %
Platelets: 172 10*3/uL (ref 150–400)
RBC: 3.14 MIL/uL — ABNORMAL LOW (ref 3.87–5.11)
RDW: 15.2 % (ref 11.5–15.5)
WBC: 7.3 10*3/uL (ref 4.0–10.5)
nRBC: 0 % (ref 0.0–0.2)

## 2020-08-05 LAB — FOLATE: Folate: 67.5 ng/mL (ref >5.9)

## 2020-08-05 LAB — VITAMIN B12: Vitamin B-12: 964 pg/mL — ABNORMAL HIGH (ref 180–914)

## 2020-08-05 LAB — TROPONIN I (HIGH SENSITIVITY): Troponin I (High Sensitivity): 28 ng/L — ABNORMAL HIGH

## 2020-08-05 LAB — PHOSPHORUS: Phosphorus: 3.3 mg/dL (ref 2.5–4.6)

## 2020-08-05 MED ORDER — AMIODARONE HCL 200 MG PO TABS
400.0000 mg | ORAL_TABLET | Freq: Two times a day (BID) | ORAL | Status: DC
  Administered 2020-08-05 – 2020-08-09 (×9): 400 mg via ORAL
  Filled 2020-08-05 (×9): qty 2

## 2020-08-05 MED ORDER — HEPARIN SODIUM (PORCINE) 1000 UNIT/ML DIALYSIS: 20.0000 [IU]/kg | INTRAMUSCULAR | Status: DC | PRN

## 2020-08-05 MED ORDER — METOPROLOL TARTRATE 5 MG/5ML IV SOLN
2.5000 mg | Freq: Once | INTRAVENOUS | Status: DC | PRN
  Filled 2020-08-05: qty 5

## 2020-08-05 MED ORDER — BRIMONIDINE TARTRATE 0.2 % OP SOLN
1.0000 [drp] | Freq: Two times a day (BID) | OPHTHALMIC | Status: DC
  Administered 2020-08-05 – 2020-08-10 (×6): 1 [drp] via OPHTHALMIC
  Filled 2020-08-05: qty 5

## 2020-08-05 MED ORDER — MIDODRINE HCL 5 MG PO TABS
10.0000 mg | ORAL_TABLET | Freq: Once | ORAL | Status: AC
  Administered 2020-08-05: 10 mg via ORAL
  Filled 2020-08-05: qty 2

## 2020-08-05 MED ORDER — DARBEPOETIN ALFA 40 MCG/0.4ML IJ SOSY
40.0000 ug | PREFILLED_SYRINGE | INTRAMUSCULAR | Status: DC
  Filled 2020-08-05: qty 0.4

## 2020-08-05 MED ORDER — AMIODARONE HCL 200 MG PO TABS
200.0000 mg | ORAL_TABLET | Freq: Once | ORAL | Status: AC
  Administered 2020-08-05: 200 mg via ORAL
  Filled 2020-08-05: qty 1

## 2020-08-05 MED ORDER — BRINZOLAMIDE 1 % OP SUSP
1.0000 [drp] | Freq: Two times a day (BID) | OPHTHALMIC | Status: DC
  Administered 2020-08-05 – 2020-08-09 (×7): 1 [drp] via OPHTHALMIC
  Filled 2020-08-05: qty 10

## 2020-08-05 MED ORDER — GUAIFENESIN 100 MG/5ML PO SOLN
5.0000 mL | ORAL | Status: DC | PRN
  Administered 2020-08-05: 100 mg via ORAL
  Filled 2020-08-05: qty 5

## 2020-08-05 NOTE — Progress Notes (Signed)
Patient ID: [name] James, female   DOB: [birth year], 84 y.o.   MRN: [id number]  PROGRESS NOTE    LATARSHIA JERSEY  TDH:[id number] DOB: 30-Apr-1936 DOA: 08/03/2020 PCP: Patrick Jupiter   Brief Narrative:  84 year old female with history of ESRD on hemodialysis, GERD, paroxysmal A. fib, hyperlipidemia, hypertension, diabetes mellitus type 2 presented with chest discomfort and palpitation on 08/03/2020 when she was having hemodialysis.  She was sent to the ER before completing her hemodialysis.  In the ED, her heart rate was in the 130s to 140s for which she was given Lopressor.  Subsequently she was hypotensive and placed in observation.  Nephrology was consulted.  Chest x-ray showed bilateral minimal pleural effusion but no overt pulmonary edema.  Assessment & Plan:   Paroxysmal A. fib with RVR -Presented with heart rate in the 130s to 140s for which she required Lopressor in the ED.  Currently rate controlled.  Patient was recently seen by Dr. Caryl Comes in the ED on 08/01/2020 and she was started on amiodarone 200 mg twice a day.   -Was tachycardic again overnight.  Will consult cardiology.  Hypotension -Blood pressure in the 80s on presentation.  Currently blood pressure on the lower side but improved to 90s-100s.  Continue midodrine.   ESRD on hemodialysis -Nephrology following.  Planning for hemodialysis today.  Dialysis overnight.  Anemia of chronic disease -From renal failure.  Hemoglobin stable.  Monitor  Chronic macrocytosis -Questionable cause.  Check vitamin B12 and folate levels.  Daughter wants these added onto today's blood work.  Chronic diastolic CHF -Strict input and output.  Daily weights.  Volume managed by dialysis  Diabetes mellitus type 2 -Continue CBGs with SSI  Stage II sacral ulcer -Present on admission.  Continue local wound care. Pressure Injury 08/03/20 Sacrum Stage 2 -  Partial thickness loss of dermis presenting as a shallow open injury with a red, pink wound bed  without slough. (Active)  08/03/20 2230  Location: Sacrum  Location Orientation:   Staging: Stage 2 -  Partial thickness loss of dermis presenting as a shallow open injury with a red, pink wound bed without slough.  Wound Description (Comments):   Present on Admission: Yes        Chronic hypoxic respiratory failure -Continue supplemental oxygen.  Currently on 2 L oxygen.  Generalized deconditioning -Patient looks very deconditioned and chronically ill.  Daughter is not interested in palliative care discussions.  Daughter states that her mother is hard of hearing and does not understand the concept of these discussions; although the patient has expressed to multiple providers that she would like to pass away in peace; daughter does not want any more discussion regarding this.  Patient to remain full code.  DVT prophylaxis: SCDs Code Status: Full Family Communication: Spoke to daughter Kenney Houseman on phone on 08/05/2020 Disposition Plan: Status is: Inpatient.  Will need further input from cardiology regarding A. fib RVR.   Dispo: The patient is from: SNF              Anticipated d/c is to: SNF              Anticipated d/c date is: 1 day              Patient currently is not medically stable to d/c.   Consultants: Nephrology.  palliative care.  Cardiology  Procedures: None  Antimicrobials: None   Subjective: Patient seen and examined at bedside.  Poor historian.  Complains of some chest tightness.  Feels weak.  Denies worsening shortness of breath or palpitation.  No overnight fever noted.  Objective: Vitals:   08/05/20 0437 08/05/20 0637 08/05/20 0738 08/05/20 0950  BP: (!) 106/39 (!) 85/57 111/81   Pulse: (!) 125 (!) 121 81 75  Resp: 20 19 (!) 23 13  Temp: 98 F (36.7 C) 98.1 F (36.7 C)  98.5 F (36.9 C)  TempSrc: Oral Oral  Oral  SpO2: 99% 98% 99% 99%  Weight:  59.6 kg      Intake/Output Summary (Last 24 hours) at 08/05/2020 1129 Last data filed at 08/05/2020  0437 Gross per 24 hour  Intake --  Output 1078 ml  Net -1078 ml   Filed Weights   08/04/20 0422 08/05/20 0637  Weight: 59.4 kg 59.6 kg    Examination:  General exam: Looks chronically ill.  Currently on 2 L oxygen via nasal cannula.  Poor historian.  Hard of hearing Respiratory system: Bilateral decreased breath sounds at bases with some crackles.  No wheezing Cardiovascular system: Currently rate controlled; S1-S2 heard Gastrointestinal system: Abdomen is nondistended, soft and nontender.  Bowel sounds are heard  extremities: Bilateral lower extremity edema present.  No clubbing Central nervous system: Awake.  Poor historian. No focal neurological deficits.  Moves extremities  skin: No obvious ecchymosis or lesions. Psychiatry: Flat affect  Data Reviewed: I have personally reviewed following labs and imaging studies  CBC: Recent Labs  Lab 08/01/20 1052 08/01/20 1115 08/03/20 1450 08/04/20 0342 08/05/20 0918  WBC 6.5  --  7.4 5.4 7.3  NEUTROABS 4.5  --   --   --  4.9  HGB 11.1* 12.9 10.8* 9.3* 10.1*  HCT 37.3 38.0 35.8* 30.8* 33.1*  MCV 108.1*  --  107.5* 107.3* 105.4*  PLT 177  --  184 168 150   Basic Metabolic Panel: Recent Labs  Lab 08/01/20 1052 08/01/20 1115 08/01/20 1302 08/01/20 1639 08/03/20 1450 08/04/20 0342 08/05/20 0918  NA 140 140  --   --  138 139 138  K 3.2* 3.1*  --   --  4.5 4.0 4.2  CL 98 97*  --   --  100 101 101  CO2 29  --   --   --  25 27 28   GLUCOSE 106* 100*  --   --  105* 128* 166*  BUN 22 24*  --   --  21 26* 11  CREATININE 3.63* 3.40*  --   --  3.32* 3.81* 2.33*  CALCIUM 8.3*  --   --   --  7.5* 7.4* 7.7*  MG  --   --  1.9 2.0 1.9  --   --    GFR: Estimated Creatinine Clearance: 16.8 mL/min (A) (by C-G formula based on SCr of 2.33 mg/dL (H)). Liver Function Tests: Recent Labs  Lab 08/01/20 1052  AST 24  ALT 13  ALKPHOS 81  BILITOT 0.8  PROT 6.9  ALBUMIN 2.7*   No results for input(s): LIPASE, AMYLASE in the last 168  hours. No results for input(s): AMMONIA in the last 168 hours. Coagulation Profile: No results for input(s): INR, PROTIME in the last 168 hours. Cardiac Enzymes: No results for input(s): CKTOTAL, CKMB, CKMBINDEX, TROPONINI in the last 168 hours. BNP (last 3 results) No results for input(s): PROBNP in the last 8760 hours. HbA1C: No results for input(s): HGBA1C in the last 72 hours. CBG: No results for input(s): GLUCAP in the last 168 hours. Lipid Profile: No results for input(s): CHOL, HDL,  LDLCALC, TRIG, CHOLHDL, LDLDIRECT in the last 72 hours. Thyroid Function Tests: No results for input(s): TSH, T4TOTAL, FREET4, T3FREE, THYROIDAB in the last 72 hours. Anemia Panel: No results for input(s): VITAMINB12, FOLATE, FERRITIN, TIBC, IRON, RETICCTPCT in the last 72 hours. Sepsis Labs: No results for input(s): PROCALCITON, LATICACIDVEN in the last 168 hours.  Recent Results (from the past 240 hour(s))  SARS Coronavirus 2 by RT PCR (hospital order, performed in [facility] hospital lab) Nasopharyngeal Nasopharyngeal Swab     Status: None   Collection Time: 08/03/20  8:10 PM   Specimen: Nasopharyngeal Swab  Result Value Ref Range Status   SARS Coronavirus 2 NEGATIVE NEGATIVE Final    Comment: (NOTE) SARS-CoV-2 target nucleic acids are NOT DETECTED.  The SARS-CoV-2 RNA is generally detectable in upper and lower respiratory specimens during the acute phase of infection. The lowest concentration of SARS-CoV-2 viral copies this assay can detect is 250 copies / mL. A negative result does not preclude SARS-CoV-2 infection and should not be used as the sole basis for treatment or other patient management decisions.  A negative result may occur with improper specimen collection / handling, submission of specimen other than nasopharyngeal swab, presence of viral mutation(s) within the areas targeted by this assay, and inadequate number of viral copies (<250 copies / mL). A negative result must be  combined with clinical observations, patient history, and epidemiological information.  Fact Sheet for Patients:   StrictlyIdeas.no  Fact Sheet for Healthcare Providers: BankingDealers.co.za  This test is not yet approved or  cleared by the [place] FDA and has been authorized for detection and/or diagnosis of SARS-CoV-2 by FDA under an Emergency Use Authorization (EUA).  This EUA will remain in effect (meaning this test can be used) for the duration of the COVID-19 declaration under Section 564(b)(1) of the Act, 21 U.S.C. section 360bbb-3(b)(1), unless the authorization is terminated or revoked sooner.  Performed at [facility], [place] [address] [place], [place] [address]          Radiology Studies: DG Chest Port 1 View  Result Date: 08/03/2020 CLINICAL DATA:  Dyspnea, new onset atrial fibrillation EXAM: PORTABLE CHEST 1 VIEW COMPARISON:  08/01/2020 FINDINGS: Small left pleural effusion with associated left basilar compressive atelectasis and retrocardiac opacification is unchanged. Tiny right pleural effusion is suspected, unchanged. Lung volumes are small, but symmetric. No superimposed focal pulmonary infiltrate. No pneumothorax. Cardiac size within normal limits. No acute bone abnormality. IMPRESSION: 1. Unchanged small left pleural effusion with associated left basilar compressive atelectasis and retrocardiac opacification. 2. Probable tiny right pleural effusion, unchanged. 3. Progressive pulmonary hypoinflation. Electronically Signed   By: Fidela Salisbury MD   On: 08/03/2020 15:36        Scheduled Meds: . allopurinol  100 mg Oral Daily  . amiodarone  200 mg Oral Once  . amiodarone  400 mg Oral BID  . aspirin  81 mg Oral Daily  . azelastine  2 spray Each Nare BID  . brinzolamide  1 drop Both Eyes BID   Or  . brimonidine  1 drop Both Eyes BID  . calcium carbonate  1 tablet Oral BID  . Chlorhexidine Gluconate  Cloth  6 each Topical Q0600  . [START ON 08/06/2020] darbepoetin (ARANESP) injection - DIALYSIS  40 mcg Intravenous Q Mon-HD  . diazepam  5 mg Oral QHS  . diclofenac Sodium  2 g Topical QHS  . dimethicone  1 application Topical BID  . [START ON 08/06/2020] diphenoxylate-atropine  1 tablet Oral Q M,W,F-HD  . fluticasone  2 spray Each Nare Daily  . ipratropium  2 spray Each Nare Q12H  . [START ON 08/06/2020] loperamide  4 mg Oral Q M,W,F-HD  . loratadine  10 mg Oral Daily  . melatonin  10 mg Oral QHS  . [START ON 08/06/2020] midodrine  10 mg Oral Q M,W,F-HD  . multivitamin  1 tablet Oral QHS  . traZODone  50 mg Oral QHS   Continuous Infusions:        Aline August, MD [facility] 08/05/2020, 11:29 AM

## 2020-08-05 NOTE — Progress Notes (Signed)
   08/05/20 0437  Hand-Off documentation  Handoff Given Given to shift RN/LPN  Report given to (Full Name) K. Landry Mellow, Rn  Handoff Received Received from shift RN/LPN  Report received from (Full Name) Cordale Manera, RN  Vital Signs  Temp 98 F (36.7 C)  Temp Source Oral  Pulse Rate (!) 125  Pulse Rate Source Monitor  Resp 20  BP (!) 106/39  BP Location Left Leg  BP Method Automatic  Patient Position (if appropriate) Lying  Oxygen Therapy  SpO2 99 %  O2 Device Room Air  Pain Assessment  Pain Scale 0-10  Pain Score 0  Post-Hemodialysis Assessment  Rinseback Volume (mL) 250 mL  KECN 227 V  Dialyzer Clearance Lightly streaked  Duration of HD Treatment -hour(s) 3.5 hour(s)  Hemodialysis Intake (mL) 500 mL  UF Total -Machine (mL) 1578 mL  Net UF (mL) 1078 mL  Tolerated HD Treatment Yes  Post-Hemodialysis Comments tx achieved as scheduled with hypotensive at times.  AVG/AVF Arterial Site Held (minutes) 10 minutes  AVG/AVF Venous Site Held (minutes) 10 minutes  Education / Care Plan  Dialysis Education Provided Yes  Note  Observations a/ox4, verbally responsive, no complaints.

## 2020-08-05 NOTE — Progress Notes (Signed)
   Palliative Medicine Inpatient Follow Up Note  Had reached out to patients daughter, Kenney Houseman and left a VM introducing myself. She expressed to our case manager, Caryl Pina yesterday that she does not with for our services. She asked that we not be involved in her mothers care.    Per TOC Note --> "Kenney Houseman does not desire palliative services at this time or in the future.  She does not wish to discuss this further. "  Given this information, we will sign off. We are happy to re-involve if [name] James changes her mind.  It should be noted that patient did express that she would not want life prolonging measures to sustain living. She does however defer this "final decision" to her daughter.   No Charge ______________________________________________________________________________________ [facility] Team Team Cell Phone: [phone] Please utilize secure chat with additional questions, if there is no response within 30 minutes please call the above phone number  Palliative Medicine Team providers are available by phone from 7am to 7pm daily and can be reached through the team cell phone.  Should this patient require assistance outside of these hours, please call the patient's attending physician.

## 2020-08-05 NOTE — Progress Notes (Signed)
BP 82/61, HR 70s.  MD Starla Link notified. Orders to give PRN midodrine early. Nephrology notified as well

## 2020-08-05 NOTE — Progress Notes (Signed)
[facility] Progress Note   Subjective:   Patient seen in room. Had HD overnight with net UF 1L, UF limited by tachycardia and hypotension. BP and HR controlled. Reports she still feels a sensation of chest tightness but breathing is better. States she does not feel ready to go home today.   Objective Vitals:   08/05/20 0407 08/05/20 0437 08/05/20 0637 08/05/20 0738  BP: (!) 126/20 (!) 106/39 (!) 85/57 111/81  Pulse: (!) 128 (!) 125 (!) 121 81  Resp: 20 20 19  (!) 23  Temp:  98 F (36.7 C) 98.1 F (36.7 C)   TempSrc:  Oral Oral   SpO2:  99% 98% 99%  Weight:   59.6 kg    Physical Exam General: Chronically ill appearing female, alert and in NAD Heart: RRR, no murmurs, rubs or gallops Lungs: CTA anteriorly without wheezing, rhonchi or rales Abdomen: Soft, non-tender, non-distended, +BS Extremities: trace pedal edema bilateral lower extremities Dialysis Access: RUE AVG + bruit  Additional Objective Labs: Basic Metabolic Panel: Recent Labs  Lab 08/01/20 1052 08/01/20 1052 08/01/20 1115 08/03/20 1450 08/04/20 0342  NA 140   < > 140 138 139  K 3.2*   < > 3.1* 4.5 4.0  CL 98   < > 97* 100 101  CO2 29  --   --  25 27  GLUCOSE 106*   < > 100* 105* 128*  BUN 22   < > 24* 21 26*  CREATININE 3.63*   < > 3.40* 3.32* 3.81*  CALCIUM 8.3*  --   --  7.5* 7.4*   < > = values in this interval not displayed.   Liver Function Tests: Recent Labs  Lab 08/01/20 1052  AST 24  ALT 13  ALKPHOS 81  BILITOT 0.8  PROT 6.9  ALBUMIN 2.7*   CBC: Recent Labs  Lab 08/01/20 1052 08/01/20 1052 08/01/20 1115 08/03/20 1450 08/04/20 0342  WBC 6.5  --   --  7.4 5.4  NEUTROABS 4.5  --   --   --   --   HGB 11.1*   < > 12.9 10.8* 9.3*  HCT 37.3   < > 38.0 35.8* 30.8*  MCV 108.1*  --   --  107.5* 107.3*  PLT 177  --   --  184 168   < > = values in this interval not displayed.   Blood Culture    Component Value Date/Time   SDES URINE, CATHETERIZED 03/14/2019 0430    SPECREQUEST  03/14/2019 0430    NONE Performed at [facility] [address] [place], [place] [address]    CULT >=100,000 COLONIES/mL ESCHERICHIA COLI (A) 03/14/2019 0430   REPTSTATUS 03/16/2019 FINAL 03/14/2019 0430    Studies/Results: DG Chest Port 1 View  Result Date: 08/03/2020 CLINICAL DATA:  Dyspnea, new onset atrial fibrillation EXAM: PORTABLE CHEST 1 VIEW COMPARISON:  08/01/2020 FINDINGS: Small left pleural effusion with associated left basilar compressive atelectasis and retrocardiac opacification is unchanged. Tiny right pleural effusion is suspected, unchanged. Lung volumes are small, but symmetric. No superimposed focal pulmonary infiltrate. No pneumothorax. Cardiac size within normal limits. No acute bone abnormality. IMPRESSION: 1. Unchanged small left pleural effusion with associated left basilar compressive atelectasis and retrocardiac opacification. 2. Probable tiny right pleural effusion, unchanged. 3. Progressive pulmonary hypoinflation. Electronically Signed   By: Fidela Salisbury MD   On: 08/03/2020 15:36   Medications:  . allopurinol  100 mg Oral Daily  . amiodarone  200 mg  Oral BID  . aspirin  81 mg Oral Daily  . azelastine  2 spray Each Nare BID  . calcium carbonate  1 tablet Oral BID  . Chlorhexidine Gluconate Cloth  6 each Topical Q0600  . diazepam  5 mg Oral QHS  . diclofenac Sodium  2 g Topical QHS  . dimethicone  1 application Topical BID  . [START ON 08/06/2020] diphenoxylate-atropine  1 tablet Oral Q M,W,F-HD  . fluticasone  2 spray Each Nare Daily  . ipratropium  2 spray Each Nare Q12H  . [START ON 08/06/2020] loperamide  4 mg Oral Q M,W,F-HD  . loratadine  10 mg Oral Daily  . melatonin  10 mg Oral QHS  . [START ON 08/06/2020] midodrine  10 mg Oral Q M,W,F-HD  . midodrine  10 mg Oral Q Sun-HD  . multivitamin  1 tablet Oral QHS  . traZODone  50 mg Oral QHS    Dialysis Orders: Center: [facility]  on MWF. Time: 3 hours 45 min, 3K, 2.25Ca, BFR  400, DFR 800, EDW 54.5kg, RUE AVG, heparin 3300 unit bolus Mircera 30 mcg IVP q 2 weeks- last dose 07/23/20 Hectorol 1 mcg IV q HD  Assessment/Plan: 1.  A fib with RVR: Tachycardic and hypotensive at HD on 08/03/20, presented to ED on 08/01/20 with similar symptoms. On amiodarone. Received 1L fluids and lopressor. Symptoms recurred with HD last night. Currently in regular rhythm with rate controlled, BP improved. Per primary/cardiology. Chronically low potassium, Keep K+ >4 2.  ESRD:  MWF schedule, only had 1.5 treatments this week, extra HD last night with 1L removed. HD again tomorrow on regular schedule, use 4K bath.  3.  Hypertension/volume: BP soft with fluid overload as above, on amiodarone.Takes midodrine 10mg  before dialysis and another 10mg  mid treatment as needed, will give midodrine pre-HD here.  4.  Anemia: Hgb 9.2, on ESA, next dose due 8/16. No blood loss reported 5.  Metabolic bone disease: Corrected calcium 8.4. Will use added Ca bath with HD. Continue hectorol. Continue Tums. Not on additional phosphorus binder.  6.  Nutrition:  Albumin low, add pro-stat supplement.    Anice Paganini, PA-C 08/05/2020, 9:07 AM  [facility] Pager: [phone]

## 2020-08-05 NOTE — Consult Note (Addendum)
Cardiology Consultation:   Patient ID: [name] James MRN: [id number]; DOB: [birth year]  Admit date: 08/03/2020 Date of Consult: 08/05/2020  Primary Care Provider: [facility] [place] Cardiologist: Sanda Jah Alarid, MD   [facility] Electrophysiologist:  None     Patient Profile:   [name] James is a 84 y.o. female with the following history who is being seen today for the evaluation of atrial fibrillation with rapid ventricular rate at the request of Dr. Aline August.  Paroxysmal atrial fibrillation   Not on anticoagulation due to hx of GI bleeding  Amiodarone started 07/2020  Tachy-Brady syndrome   Hx of junctional brady; EP felt poor candidate for pacer (occluded L subcl artery, dialysis via R arm AVF)  Antiarrhythmics previously avoided   Diastolic CHF  Echocardiogram 06/2018: mild LVH, EF 65-70, trivial AI, severe LAE, PASP 44  Coronary Ca2+ on CT  Myoview 12/15: EF 68, small inf-lat infarct, no ischemia; low risk  ESRD on hemodialysis  GERD  Hyperlipidemia   Hypertension   Diabetes mellitus   Anemia of chronic disease  Breast CA, s/p bilat mastectomies    History of Present Illness:   [name] James was last seen by Dr. Sallyanne Kuster in 03/2020.  She was recently seen in the ED by Dr. Caryl Comes 08/01/2020 for AFib with RVR and acute CHF.  The decision was made at that time to start Amiodarone for rhythm control.    She was admitted after she developed chest pain and elevated HR 40 min into her dialysis session on Fri 08/03/20.  I spoke to Dr. [name] with nephrology.  She continues to have symptomatic atrial fibrillation with rapid ventricular rate complicated by hypotension during dialysis sessions which is limiting her dialysis.  The patient has symptoms of palpitations when she is in atrial fibrillation.  She also notes a lot of chest discomfort described as heaviness.  She thinks that there may be some improvement with removal of volume.  However, she does  describe some radiation up into her jaw.  She does note orthopnea.  She has minimal ankle swelling and relates this to previous fracture in her left leg.  She has not had syncope.  Data K 4, SCr 3.81, Mg 1.9, Hgb 9.3 Hs-Trop 20 SARS-CoV-2 neg  CXR: unchanged small L eff w assoc L basilar ATX, prob tiny R eff, pulmonary hypoinflation EKG 08/05/20: AFib, HR 124, normal axis, non-specific ST-TW changes, QTc 471 EKG 08/03/20: normal sinus rhythm, HR 69, PRWP, QTc 455 EKG 08/03/20: atrial fibrillation, HR 139, normal axis, inf-lat ST depression, QTc 514  Rx Amiodarone 200 twice daily  ASA 81 Metoprolol tartrate 2.5 IV prn    Past Medical History:  Diagnosis Date  . Anemia, chronic disease   . Anxiety   . Arthritis    knees  . Atrial fibrillation ([place])   . Barrett esophagus   . Benign paroxysmal positional vertigo   . Breast cancer ([place]) 1980s   bil mastectomies, no radiation or chemo  . Cataract   . Chest pain, atypical 12/05/2008   R/Lmv- normal perfusion all regions, noe ECG changes   . CHF (congestive heart failure) ([place]) 05/13/2011   echo - IO>97%; stage 1 diastolic dysfunction; elevated LV filling pressure, MAC  . CKD (chronic kidney disease), stage III   . Claudication ([place]) 10/30/2005   doppler - normal evaluation, no evidence of aneurysm, diameter reduction, dissection, compression or vascular abnormality)  . Complication of anesthesia   . Controlled type 2 diabetes mellitus with diabetic  polyneuropathy, with long-term current use of insulin ([place]) 04/17/2016  . Dyslipidemia   . Dysrhythmia    Atrial Fibrillation  . Edema   . ESRD (end stage renal disease) on dialysis ([place])    "[facility]; [address]; MWF" (07/06/2018)  . Family history of adverse reaction to anesthesia    son also has nausea   . Fever blister 12/2016  . Gastritis and gastroduodenitis   . GERD (gastroesophageal reflux disease)   . Gout   . Hemodialysis-associated hypotension   . HLD (hyperlipidemia)   .  Hypertension   . IBS (irritable bowel syndrome)   . Insomnia   . Iron deficiency anemia   . Junctional bradycardia 08/25/2018  . LFT elevation   . Long term current use of amiodarone 10/17/2016  . Morbid (severe) obesity due to excess calories ([place]) 04/29/2017  . Morbid obesity ([place])   . On home oxygen therapy    "2L; 24/7" (07/06/2018)  . PAF (paroxysmal atrial fibrillation) ([place])   . Peripheral neuropathy   . Pneumonia 12/2016  . PONV (postoperative nausea and vomiting)   . Renal cyst   . Renal insufficiency 10/30/2005   doppler - abn resistance consistent w/ parenchymal disease  . Renovascular hypertension   . Type 2 diabetes mellitus with diabetic neuropathy, with long-term current use of insulin ([place])   . Vertigo     Past Surgical History:  Procedure Laterality Date  . A/V FISTULAGRAM Left 03/19/2018   Procedure: A/V FISTULAGRAM;  Surgeon: Angelia Mould, MD;  Location: [place] [facility];  Service: Cardiovascular;  Laterality: Left;  . A/V FISTULAGRAM N/A 12/24/2018   Procedure: A/V FISTULAGRAM - Right Arm;  Surgeon: Angelia Mould, MD;  Location: [place] [facility];  Service: Cardiovascular;  Laterality: N/A;  . A/V FISTULAGRAM Right 03/29/2019   Procedure: A/V FISTULAGRAM;  Surgeon: Angelia Mould, MD;  Location: [place] [facility];  Service: Cardiovascular;  Laterality: Right;  . A/V FISTULAGRAM Right 02/03/2020   Procedure: A/V FISTULAGRAM;  Surgeon: Angelia Mould, MD;  Location: [place] [facility];  Service: Cardiovascular;  Laterality: Right;  . A/V SHUNTOGRAM Left 01/19/2018   Procedure: A/V SHUNTOGRAM;  Surgeon: Serafina Mitchell, MD;  Location: [place] [facility];  Service: Cardiovascular;  Laterality: Left;  . ANGIOPLASTY Left 02/12/2015   Procedure: ANGIOPLASTY;  Surgeon: Angelia Mould, MD;  Location[facility] CATH LAB;  Service: Cardiovascular;  Laterality: Left;  AVF  . AV FISTULA PLACEMENT Left 11/30/2014   Procedure: ARTERIOVENOUS (AV)  FISTULA CREATION LEFT ARM;  Surgeon: Angelia Mould, MD;  Location: [place];  Service: Vascular;  Laterality: Left;  . AV FISTULA PLACEMENT Left 03/20/2015   Procedure:  Inserton of Left Upper Arm Gortex Graft;  Surgeon: Angelia Mould, MD;  Location: [place];  Service: Vascular;  Laterality: Left;  . AV FISTULA PLACEMENT Left 04/12/2015   Procedure: INSERTION OF LEFT ARM  ARTERIOVENOUS GORE-TEX GRAFT ;  Surgeon: Angelia Mould, MD;  Location: [place];  Service: Vascular;  Laterality: Left;  . AV FISTULA PLACEMENT Right 06/08/2018   Procedure: INSERTION OF ARTERIOVENOUS (AV) GORE-TEX GRAFT ARM USING 4-7MM X 45CM GORETEX GRAFT;  Surgeon: Angelia Mould, MD;  Location: [place];  Service: Vascular;  Laterality: Right;  . BACK SURGERY  ~1980  . CATARACT EXTRACTION Right   . CHOLECYSTECTOMY    . ESOPHAGOGASTRODUODENOSCOPY N/A 10/24/2014   Procedure: ESOPHAGOGASTRODUODENOSCOPY (EGD);  Surgeon: Jerene Bears, MD;  Location: [facility] ENDOSCOPY;  Service: Endoscopy;  Laterality: N/A;  . EXCHANGE OF A DIALYSIS CATHETER Left 11/30/2014   Procedure: EXCHANGE OF A DIALYSIS CATHETER, LEFT INTERNAL JUGULAR;  Surgeon: Angelia Mould, MD;  Location: [place];  Service: Vascular;  Laterality: Left;  . EYE SURGERY Left    transplant  . FISTULOGRAM N/A 02/12/2015   Procedure: FISTULOGRAM;  Surgeon: Angelia Mould, MD;  Location: [facility] CATH LAB;  Service: Cardiovascular;  Laterality: N/A;  . HEMORRHOID SURGERY    . IR AV DIALY SHUNT INTRO NEEDLE/INTRACATH INITIAL W/PTA/IMG RIGHT Right 08/25/2018  . IR AV DIALY SHUNT INTRO NEEDLE/INTRACATH INITIAL W/PTA/IMG RIGHT Right 11/17/2018  . IR US GUIDE VASC ACCESS RIGHT  08/25/2018  . LIGATION ARTERIOVENOUS GORTEX GRAFT Left 08/27/2018   Procedure: LIGATION ARTERIOVENOUS GORTEX GRAFT ARM;  Surgeon: Angelia Mould, MD;  Location: [place];  Service: Vascular;  Laterality: Left;  . LIGATION OF ARTERIOVENOUS  FISTULA Left 03/20/2015   Procedure: LIGATION OF  ARTERIOVENOUS  FISTULA;  Surgeon: Angelia Mould, MD;  Location: [place];  Service: Vascular;  Laterality: Left;  Marland Kitchen MASTECTOMY Bilateral ~1982   bil breast reconstruction with implants  . ORIF TIBIA & FIBULA FRACTURES Left 2007   also had left non displaced malleolar fracture.   Marland Kitchen PERIPHERAL VASCULAR BALLOON ANGIOPLASTY Left 01/19/2018   Procedure: PERIPHERAL VASCULAR BALLOON ANGIOPLASTY;  Surgeon: Serafina Mitchell, MD;  Location: [place] [facility];  Service: Cardiovascular;  Laterality: Left;  fistula  . PERIPHERAL VASCULAR BALLOON ANGIOPLASTY Left 03/19/2018   Procedure: PERIPHERAL VASCULAR BALLOON ANGIOPLASTY;  Surgeon: Angelia Mould, MD;  Location: [place] [facility];  Service: Cardiovascular;  Laterality: Left;  UPPER ARM FISTULA  . PERIPHERAL VASCULAR BALLOON ANGIOPLASTY Right 12/24/2018   Procedure: PERIPHERAL VASCULAR BALLOON ANGIOPLASTY;  Surgeon: Angelia Mould, MD;  Location: [place] [facility];  Service: Cardiovascular;  Laterality: Right;  arm fistula  . PERIPHERAL VASCULAR BALLOON ANGIOPLASTY  03/29/2019   Procedure: PERIPHERAL VASCULAR BALLOON ANGIOPLASTY;  Surgeon: Angelia Mould, MD;  Location: [place] [facility];  Service: Cardiovascular;;  . PERIPHERAL VASCULAR BALLOON ANGIOPLASTY Right 02/03/2020   Procedure: PERIPHERAL VASCULAR BALLOON ANGIOPLASTY;  Surgeon: Angelia Mould, MD;  Location: [place] [facility];  Service: Cardiovascular;  Laterality: Right;  upper arm fistula  . PERIPHERAL VASCULAR BALLOON ANGIOPLASTY  06/22/2020   Procedure: PERIPHERAL VASCULAR BALLOON ANGIOPLASTY;  Surgeon: Angelia Mould, MD;  Location: [place] [facility];  Service: Cardiovascular;;  Right AVF  . REMOVAL OF GRAFT Left 04/12/2015   Procedure: REMOVAL OF LEFT ARM ARTERIOVENOUS GORE-TEX GRAFT;  Surgeon: Angelia Mould, MD;  Location: [place];  Service: Vascular;  Laterality: Left;  . REVISION OF ARTERIOVENOUS GORETEX GRAFT Right 12/13/2019   Procedure:  REVISION OF ARTERIOVENOUS GORETEX GRAFT RIGHT FOREARM;  Surgeon: Angelia Mould, MD;  Location: [facility];  Service: Vascular;  Laterality: Right;     Home Medications:  Prior to Admission medications   Medication Sig Start Date End Date Taking? Authorizing Provider  acetaminophen (TYLENOL) 325 MG tablet Take 650 mg by mouth every 6 (six) hours as needed for fever (greater than 100 F).    Yes [provider]  allopurinol (ZYLOPRIM) 100 MG tablet Take 100 mg by mouth daily.    Yes [provider]  amiodarone (PACERONE) 200 MG tablet Take 1 tablet (200 mg total) by mouth 2 (two) times daily. 08/01/20  Yes Drenda Freeze, MD  aspirin 81 MG chewable tablet Chew 81 mg by mouth daily.  Yes [provider]  azelastine (ASTELIN) 0.1 % nasal spray Place 2 sprays into both nostrils 2 (two) times daily. 08/01/20  Yes [provider]  B Complex-C-Folic Acid (NEPHRO-VITE PO) Take 1 tablet by mouth daily.   Yes [provider]  Brinzolamide-Brimonidine (SIMBRINZA) 1-0.2 % SUSP Place 1 drop into both eyes 2 (two) times daily. (0900 & 2100)   Yes [provider]  calcium carbonate (TUMS - DOSED IN MG ELEMENTAL CALCIUM) 500 MG chewable tablet Chew 1 tablet by mouth 2 (two) times daily.   Yes [provider]  cetirizine (ZYRTEC) 10 MG tablet Take 10 mg by mouth daily.   Yes [provider]  cholestyramine (QUESTRAN) 4 g packet Take 1 packet (4 g total) by mouth 2 (two) times daily. Take twice daily before breakfast and dinner Patient taking differently: Take 4 g by mouth 2 (two) times daily as needed (removal of bile acids.).  10/13/19  Yes Lemmon, Lavone Nian, PA  diazepam (VALIUM) 5 MG tablet Take 1 tablet (5 mg total) by mouth at bedtime. 07/08/18  Yes Arrien, Jimmy Picket, MD  diclofenac Sodium (VOLTAREN) 1 % GEL Apply 2 g topically See admin instructions. Apply 2 grams to the right knee at bedtime 06/04/20  Yes [provider]  DIMETHICONE, TOPICAL, (SECURA DIMETHICONE PROTECTANT) 5 % CREA Apply 1 application topically See admin instructions. Apply a nickel-sized amount onto the buttocks area 2 times a day for barrier protection   Yes [provider]  diphenoxylate-atropine (LOMOTIL) 2.5-0.025 MG tablet Take 1 tablet by mouth See admin instructions. Take 1 tablet by mouth prior to dialysis on Mon/Wed/Fri and up to 4 times a day as needed for diarrhea or loose stools   Yes [provider]  fluticasone (FLONASE) 50 MCG/ACT nasal spray Place 2 sprays into both nostrils daily.    Yes [provider]  ipratropium (ATROVENT) 0.03 % nasal spray Place 2 sprays into both nostrils every 12 (twelve) hours. 08/02/20  Yes Hunsucker, Bonna Gains, MD  loperamide (IMODIUM A-D) 2 MG tablet Take 2 tablets (4 mg total) by mouth every 8 (eight) hours as needed for diarrhea or loose stools (for diarrhea). Patient taking differently: Take 4 mg by mouth See admin instructions. Take 4 mg by mouth on Mon/Wed/Fri prior to dialysis- may refuse, if not not needed AND every eight hours as needed for diarrhea or loose stools 03/01/19  Yes Pyrtle, Lajuan Lines, MD  Melatonin 5 MG CAPS Take 10 mg by mouth at bedtime.    Yes [provider]  Menthol, Topical Analgesic, (BIOFREEZE) 4 % GEL Apply 1 application topically 3 (three) times daily as needed (bilateral knee pain).   Yes [provider]  midodrine (PROAMATINE) 2.5 MG tablet Take 2.5 mg by mouth See admin instructions. Take 2.5 mg by mouth two times a day if B/P is <120/60   Yes [provider]  SALINE NASAL MIST NA Place 2 sprays into the nose 3 (three) times daily.   Yes [provider]  traZODone (DESYREL) 50 MG tablet Take 50 mg by mouth at bedtime.    Yes [provider]  B Complex-C-Folic Acid (NEPHRO VITAMINS) 0.8 MG TABS Take 1 tablet by mouth daily. (0900) Patient not taking: Reported on 08/03/2020    [provider]  DIMETHICONE, TOPICAL, 5 % CREA Apply 1 application topically See admin instructions. Place a nickel-sized amount onto buttock(s) area 2 times a day for barrier protection Patient not taking: Reported  on 08/03/2020    [provider]  OXYGEN Inhale 2 L into the lungs continuous.     [provider]    Inpatient Medications: Scheduled Meds: . allopurinol  100 mg Oral Daily  . amiodarone  200 mg Oral BID  . aspirin  81 mg Oral Daily  . azelastine  2 spray Each Nare BID  . brinzolamide  1 drop Both Eyes BID   Or  . brimonidine  1 drop Both Eyes BID  . calcium carbonate  1 tablet Oral BID  . Chlorhexidine Gluconate Cloth  6 each Topical Q0600  . [START ON 08/06/2020] darbepoetin (ARANESP) injection - DIALYSIS  40 mcg Intravenous Q Mon-HD  . diazepam  5 mg Oral QHS  . diclofenac Sodium  2 g Topical QHS  . dimethicone  1 application Topical BID  . [START ON 08/06/2020] diphenoxylate-atropine  1 tablet Oral Q M,W,F-HD  . fluticasone  2 spray Each Nare Daily  . ipratropium  2 spray Each Nare Q12H  . [START ON 08/06/2020] loperamide  4 mg Oral Q M,W,F-HD  . loratadine  10 mg Oral Daily  . melatonin  10 mg Oral QHS  . [START ON 08/06/2020] midodrine  10 mg Oral Q M,W,F-HD  . multivitamin  1 tablet Oral QHS  . traZODone  50 mg Oral QHS   Continuous Infusions:  PRN Meds: acetaminophen, cholestyramine, heparin, metoprolol tartrate, midodrine, ondansetron (ZOFRAN) IV  Allergies:    Allergies  Allergen Reactions  . Erythromycin Swelling  . Codeine Palpitations, Rash and Other (See Comments)    GI Upset  . Erythromycin Base Other (See Comments)    GI Upset  . Penicillins Rash and Other (See Comments)    Has taken Keflex & Rocephin many times without problems Documented on MAR  . Zantac [Ranitidine Hcl] Nausea And Vomiting    Social History:   Social History   Socioeconomic History  . Marital status: Divorced    Spouse name: Not on file  . Number of  children: Not on file  . Years of education: Not on file  . Highest education level: Not on file  Occupational History  . Not on file  Tobacco Use  . Smoking status: Never Smoker  . Smokeless tobacco: Never Used  Vaping Use  . Vaping Use: Never used  Substance and Sexual Activity  . Alcohol use: No  . Drug use: No  . Sexual activity: Not on file  Other Topics Concern  . Not on file  Social History Narrative   [facility] (10/06/17):   Originally from [facility]. Has always lived in [place]. She currently lives in [place]. No bird exposure. [place] Previously traveled to [place], [place], & [place]. Previously worked in a nursery and also a Pharmacist, community.    Social Determinants of Health   Financial Resource Strain:   . Difficulty of Paying Living Expenses:   Food Insecurity:   . Worried About Charity fundraiser in the Last Year:   . Arboriculturist in the Last Year:   Transportation Needs:   . Film/video editor (Medical):   Marland Kitchen Lack of Transportation (Non-Medical):   Physical Activity:   . Days of Exercise per Week:   . Minutes of Exercise per Session:   Stress:   . Feeling of Stress :   Social Connections:   . Frequency of Communication with Friends and Family:   . Frequency of Social Gatherings with Friends  and Family:   . Attends Religious Services:   . Active Member of Clubs or Organizations:   . Attends Archivist Meetings:   Marland Kitchen Marital Status:   Intimate Partner Violence:   . Fear of Current or Ex-Partner:   . Emotionally Abused:   Marland Kitchen Physically Abused:   . Sexually Abused:     Family History:    Family History  Problem Relation Age of Onset  . Diabetes Mother   . Heart disease Mother   . Non-Hodgkin's lymphoma Mother   . Heart attack Father   . Stroke Father   . Parkinson's disease Brother   . Lung disease Brother   . Heart disease Brother   . Multiple myeloma Sister      ROS:  Please see the history of present illness.  She has  chronic diarrhea.  She has not had melena or hematochezia.  Although, her stools are dark secondary to iron therapy.  She has not had fevers. All other ROS reviewed and negative.     Physical Exam/Data:   Vitals:   08/05/20 0437 08/05/20 0637 08/05/20 0738 08/05/20 0950  BP: (!) 106/39 (!) 85/57 111/81   Pulse: (!) 125 (!) 121 81 75  Resp: 20 19 (!) 23 13  Temp: 98 F (36.7 C) 98.1 F (36.7 C)  98.5 F (36.9 C)  TempSrc: Oral Oral  Oral  SpO2: 99% 98% 99% 99%  Weight:  59.6 kg      Intake/Output Summary (Last 24 hours) at 08/05/2020 1007 Last data filed at 08/05/2020 0437 Gross per 24 hour  Intake --  Output 1078 ml  Net -1078 ml   Last 3 Weights 08/05/2020 08/04/2020 08/02/2020  Weight (lbs) 131 lb 6.3 oz 130 lb 15.3 oz (No Data)  Weight (kg) 59.6 kg 59.4 kg (No Data)     Body mass index is 21.87 kg/m.  General:  Well nourished, well developed, in no acute distress HEENT: normal Lymph: no cervical adenopathy Neck: + JVD Endocrine:  No thryomegaly Vascular: No carotid bruits  Cardiac:  normal S1, S2; RRR; no murmur  Lungs: Bibasilar rales Abd: soft Ext: Trace bilateral ankle edema Musculoskeletal:  No deformities Skin: warm and dry  Neuro:  CNs 2-12 intact, no focal abnormalities noted Psych:  Normal affect   EKG:  The EKG was personally reviewed and demonstrates: See above Telemetry:  Telemetry was personally reviewed and demonstrates: Sinus rhythm with intermittent atrial fibrillation/flutter, heart rate 120s   Laboratory Data:  High Sensitivity Troponin:   Recent Labs  Lab 08/01/20 1052 08/01/20 1302 08/03/20 1450  TROPONINIHS 26* 30* 20*     Chemistry Recent Labs  Lab 08/01/20 1052 08/01/20 1052 08/01/20 1115 08/03/20 1450 08/04/20 0342  NA 140   < > 140 138 139  K 3.2*   < > 3.1* 4.5 4.0  CL 98   < > 97* 100 101  CO2 29  --   --  25 27  GLUCOSE 106*   < > 100* 105* 128*  BUN 22   < > 24* 21 26*  CREATININE 3.63*   < > 3.40* 3.32* 3.81*   CALCIUM 8.3*  --   --  7.5* 7.4*  GFRNONAA 11*  --   --  12* 10*  GFRAA 13*  --   --  14* 12*  ANIONGAP 13  --   --  13 11   < > = values in this interval not displayed.    Recent Labs  Lab 08/01/20 1052  PROT 6.9  ALBUMIN 2.7*  AST 24  ALT 13  ALKPHOS 81  BILITOT 0.8   Hematology Recent Labs  Lab 08/01/20 1052 08/01/20 1052 08/01/20 1115 08/03/20 1450 08/04/20 0342  WBC 6.5  --   --  7.4 5.4  RBC 3.45*  --   --  3.33* 2.87*  HGB 11.1*   < > 12.9 10.8* 9.3*  HCT 37.3   < > 38.0 35.8* 30.8*  MCV 108.1*  --   --  107.5* 107.3*  MCH 32.2  --   --  32.4 32.4  MCHC 29.8*  --   --  30.2 30.2  RDW 15.1  --   --  15.3 15.2  PLT 177  --   --  184 168   < > = values in this interval not displayed.   BNP Recent Labs  Lab 08/01/20 1413  BNP 1,092.0*     Radiology/Studies:  DG Chest Port 1 View  Result Date: 08/03/2020 IMPRESSION: 1. Unchanged small left pleural effusion with associated left basilar compressive atelectasis and retrocardiac opacification. 2. Probable tiny right pleural effusion, unchanged. 3. Progressive pulmonary hypoinflation. Electronically Signed   By: Fidela Salisbury MD   On: 08/03/2020 15:36         New York Heart Association (NYHA) Functional Class NYHA Class IV  Assessment and Plan:   1. Atrial fibrillation with RVR  2. Tachy-Brady syndrome She is not a pacemaker candidate.  She is also not a candidate for anticoagulation due to previous history of GI bleeding.  She was just started on amiodarone a few days ago.  She has not had an adequate load at this point.  Given her prior history of bradycardia, we may need to be cautious with higher doses of Amiodarone.  Reviewed with Dr. Caryl Comes.  We discussed increasing her Amiodarone vs adding Digoxin to control her rate.  she does have flutter at times and rate control will be difficult with Digoxin.  Therefore, we will increase her dose of Amiodarone to adequately load her and hopefully reduce/eliminate her  episodes of AFib/Flutter.      3. Chronic Diastolic CHF Volume managed by dialysis.   4. ESRD on hemodialysis As noted, dialysis is limited by AF with RVR and hypotension.  Nephrology following.   5. Coronary artery disease  Low risk Myoview in 2015.  Hs-Trop x 1 neg.  She has a lot of chest pain.  This may be from volume excess.  But, she may also be having angina.  Hypotension limits management of this and she is not a candidate for invasive evaluation (cardiac catheterization).  Continue conservative management.      For questions or updates, please contact [place] Please consult [email or web address] for contact info under      Signed, Richardson Dopp, PA-C  08/05/2020 10:07 AM    Atrial fibrillation-paroxysmal/Atrial flutter ECG reviewed 5/20///08/03/20 and tele 08/05/20  Chest pain/tightness persistent but aggravated by the above  Bradycardia/junctional rhythm in the past  End-stage renal disease on hemodialysis  Thromboembolic risk factors ( age  -2, HTN-1,DM-1, Vascular disease-1 Gender-1) for a CHADSVASc Score of >=5  Pulmonary edema on chest x-ray  Oxygen dependent COPD  Hypotension with dialysis   PE notable for  Chest wall tenderness, clear lungs Tele>> atrial flutter with 2:1 conduction   Complicated and difficult situation with recurrent rapid atrial arrhythmias particularly problematic during dialysis as it aggravates a chest tightness which has been persistent for weeks and  worsened with chest wall pressure and eating --Tn remain negative  For now 1) will increase amio for augmented rate and rhythm control--will anticipate recurrent events as the amio accumulates.  Hopefully in the absence of concomitant BB there will not be the same problem with bradycardia 2) would not anticoagulation --"In the context of her end-stage renal disease, and not related to her history of GI bleeding, I think it is reasonable that she not be anticoagulated. In this cohort  of patients, prior stroke seems to be the major variable driving a decision in favor of anticoagulation ( from my note 08/01/20) Suspect Dialysis will remain problematic for now as the amio accumulates.\  Would consider back up brady leadless pacing if bradycardia ensues with amio alone in the absence of BB

## 2020-08-06 DIAGNOSIS — I495 Sick sinus syndrome: Secondary | ICD-10-CM

## 2020-08-06 DIAGNOSIS — I10 Essential (primary) hypertension: Secondary | ICD-10-CM

## 2020-08-06 LAB — RENAL FUNCTION PANEL
Albumin: 2.4 g/dL — ABNORMAL LOW (ref 3.5–5.0)
Anion gap: 11 (ref 5–15)
BUN: 18 mg/dL (ref 8–23)
CO2: 27 mmol/L (ref 22–32)
Calcium: 8.2 mg/dL — ABNORMAL LOW (ref 8.9–10.3)
Chloride: 99 mmol/L (ref 98–111)
Creatinine, Ser: 2.97 mg/dL — ABNORMAL HIGH (ref 0.44–1.00)
GFR calc Af Amer: 16 mL/min — ABNORMAL LOW (ref >60)
GFR calc non Af Amer: 14 mL/min — ABNORMAL LOW (ref >60)
Glucose, Bld: 81 mg/dL (ref 70–99)
Phosphorus: 4 mg/dL (ref 2.5–4.6)
Potassium: 4.3 mmol/L (ref 3.5–5.1)
Sodium: 137 mmol/L (ref 135–145)

## 2020-08-06 LAB — CBC
HCT: 32 % — ABNORMAL LOW (ref 36.0–46.0)
Hemoglobin: 9.6 g/dL — ABNORMAL LOW (ref 12.0–15.0)
MCH: 32.1 pg (ref 26.0–34.0)
MCHC: 30 g/dL (ref 30.0–36.0)
MCV: 107 fL — ABNORMAL HIGH (ref 80.0–100.0)
Platelets: 184 10*3/uL (ref 150–400)
RBC: 2.99 MIL/uL — ABNORMAL LOW (ref 3.87–5.11)
RDW: 15 % (ref 11.5–15.5)
WBC: 6.4 10*3/uL (ref 4.0–10.5)
nRBC: 0 % (ref 0.0–0.2)

## 2020-08-06 MED ORDER — DIGOXIN 125 MCG PO TABS
0.0625 mg | ORAL_TABLET | ORAL | Status: DC
  Administered 2020-08-08 – 2020-08-10 (×2): 0.0625 mg via ORAL
  Filled 2020-08-06 (×3): qty 1

## 2020-08-06 MED ORDER — DIGOXIN 250 MCG PO TABS
0.2500 mg | ORAL_TABLET | Freq: Four times a day (QID) | ORAL | Status: AC
  Administered 2020-08-06 (×2): 0.25 mg via ORAL
  Filled 2020-08-06 (×2): qty 1

## 2020-08-06 MED ORDER — DARBEPOETIN ALFA 40 MCG/0.4ML IJ SOSY
PREFILLED_SYRINGE | INTRAMUSCULAR | Status: AC
  Administered 2020-08-06: 40 ug via INTRAVENOUS
  Filled 2020-08-06: qty 0.4

## 2020-08-06 MED ORDER — MIDODRINE HCL 5 MG PO TABS
ORAL_TABLET | ORAL | Status: AC
  Administered 2020-08-06: 10 mg via ORAL
  Filled 2020-08-06: qty 2

## 2020-08-06 NOTE — Progress Notes (Signed)
EP Progress Note  Patient Name: [name] James Date of Encounter: 08/06/2020  Primary Cardiologist: Sanda Klein, MD   Subjective   Still with AF/AFL during HD session. No other specific complaints this morning.   Inpatient Medications    Scheduled Meds: . allopurinol  100 mg Oral Daily  . amiodarone  400 mg Oral BID  . aspirin  81 mg Oral Daily  . azelastine  2 spray Each Nare BID  . brinzolamide  1 drop Both Eyes BID   Or  . brimonidine  1 drop Both Eyes BID  . calcium carbonate  1 tablet Oral BID  . Chlorhexidine Gluconate Cloth  6 each Topical Q0600  . darbepoetin (ARANESP) injection - DIALYSIS  40 mcg Intravenous Q Mon-HD  . diazepam  5 mg Oral QHS  . diclofenac Sodium  2 g Topical QHS  . [START ON 08/08/2020] digoxin  0.0625 mg Oral Q M,W,F  . digoxin  0.25 mg Oral Q6H  . dimethicone  1 application Topical BID  . diphenoxylate-atropine  1 tablet Oral Q M,W,F-HD  . fluticasone  2 spray Each Nare Daily  . ipratropium  2 spray Each Nare Q12H  . loperamide  4 mg Oral Q M,W,F-HD  . loratadine  10 mg Oral Daily  . melatonin  10 mg Oral QHS  . midodrine  10 mg Oral Q M,W,F-HD  . multivitamin  1 tablet Oral QHS  . traZODone  50 mg Oral QHS   Continuous Infusions:  PRN Meds: acetaminophen, cholestyramine, guaiFENesin, metoprolol tartrate, midodrine, ondansetron (ZOFRAN) IV   Vital Signs    Vitals:   08/06/20 1000 08/06/20 1033 08/06/20 1222 08/06/20 1356  BP: (!) 111/45 (!) 100/42 (!) 92/46   Pulse: 95 (!) 126 84 89  Resp: 20 20 15    Temp: 97.8 F (36.6 C) (!) 97.5 F (36.4 C) 97.8 F (36.6 C)   TempSrc: Oral Oral Oral   SpO2: 98% 99% 100%   Weight: 56.3 kg       Intake/Output Summary (Last 24 hours) at 08/06/2020 1730 Last data filed at 08/06/2020 1000 Gross per 24 hour  Intake 180 ml  Output 1525 ml  Net -1345 ml   Filed Weights   08/06/20 0400 08/06/20 0645 08/06/20 1000  Weight: 60.8 kg 57.8 kg 56.3 kg    Telemetry    Paroxysms of AFL -  Personally Reviewed  Physical Exam   GEN: No acute distress.   Frail. Neck: No JVD Cardiac: RRR, no murmurs, rubs, or gallops.  Respiratory: Clear to auscultation bilaterally. GI: Soft, nontender, non-distended  MS: No edema; No deformity. Neuro:  Nonfocal  Psych: Normal affect   Labs    Chemistry Recent Labs  Lab 08/01/20 1052 08/01/20 1115 08/04/20 0342 08/05/20 0918 08/06/20 0802  NA 140   < > 139 138 137  K 3.2*   < > 4.0 4.2 4.3  CL 98   < > 101 101 99  CO2 29   < > 27 28 27   GLUCOSE 106*   < > 128* 166* 81  BUN 22   < > 26* 11 18  CREATININE 3.63*   < > 3.81* 2.33* 2.97*  CALCIUM 8.3*   < > 7.4* 7.7* 8.2*  PROT 6.9  --   --   --   --   ALBUMIN 2.7*  --   --   --  2.4*  AST 24  --   --   --   --  ALT 13  --   --   --   --   ALKPHOS 81  --   --   --   --   BILITOT 0.8  --   --   --   --   GFRNONAA 11*   < > 10* 19* 14*  GFRAA 13*   < > 12* 22* 16*  ANIONGAP 13   < > 11 9 11    < > = values in this interval not displayed.     Hematology Recent Labs  Lab 08/04/20 0342 08/05/20 0918 08/06/20 0802  WBC 5.4 7.3 6.4  RBC 2.87* 3.14* 2.99*  HGB 9.3* 10.1* 9.6*  HCT 30.8* 33.1* 32.0*  MCV 107.3* 105.4* 107.0*  MCH 32.4 32.2 32.1  MCHC 30.2 30.5 30.0  RDW 15.2 15.2 15.0  PLT 168 172 184    Cardiac EnzymesNo results for input(s): TROPONINI in the last 168 hours. No results for input(s): TROPIPOC in the last 168 hours.   BNP Recent Labs  Lab 08/01/20 1413  BNP 1,092.0*     DDimer No results for input(s): DDIMER in the last 168 hours.   Radiology    No results found.   Patient Profile     84 y.o. female with ESRD via a RUE AVF, occluded AVF in LUE, intolerance of AC due to recurrent GI bleed, tachy-brady syndrome, HTN, HLD, DM who is currently inpatient after developing chest pain/tachycardia during HD session.   Assessment & Plan    1. Tachy-Brady Syndrome. The patient continues to have episodes of atrial fibrillation/flutter which limit her  ability to complete hemodialysis sessions.  Therapy for these tachycardias is limited due to bradycardia.  Previously, beta-blockade was trialed which resulted in a junctional rhythm.  During this admission, amiodarone was started which has been only partially effective in controlling the atrial arrhythmias.  I discussed the case in length with her primary cardiologist, the patient and [name] James who has been involved in her case in the past.  I think this point it is reasonable to attempt digoxin to assist in rate control efforts.  If she continues to only tolerate partial hemodialysis sessions I do think she would be a candidate for a pacemaker to facilitate more aggressive rhythm and/or rate control regimens.  That being said, pacemaker implant for [name] James is difficult given an occluded left upper extremity venous system, right upper extremity AV fistula.  She would be a candidate for a leadless pacemaker implant via the femoral veins.  I discussed this briefly with the patient this morning and with the patient's primary cardiologist [name] James.  Given the patient's documented intolerance to anticoagulation I do not think that she would be a good candidate for ablation therapy.   We will follow along to assess response to digoxin and make a decision about pacemaker implant later this week.  Recommend pharmacy consult to assist in digoxin dosing and monitoring levels.  For questions or updates, please contact [place] Please consult [email or web address] for contact info under Cardiology/STEMI.      Signed, [name] Mage, MD  08/06/2020, 5:30 PM

## 2020-08-06 NOTE — Progress Notes (Signed)
Patient ID: [name] James, female   DOB: 29-Jan-1936, 84 y.o.   MRN: [id number]  PROGRESS NOTE    YAMEL BALE  CBU:[id number] DOB: [birth year] DOA: 08/03/2020 PCP: Patrick Jupiter   Brief Narrative:  84 year old female with history of ESRD on hemodialysis, GERD, paroxysmal A. fib, hyperlipidemia, hypertension, diabetes mellitus type 2 presented with chest discomfort and palpitation on 08/03/2020 when she was having hemodialysis.  She was sent to the ER before completing her hemodialysis.  In the ED, her heart rate was in the 130s to 140s for which she was given Lopressor.  Subsequently she was hypotensive and placed in observation.  Nephrology was consulted.  Chest x-ray showed bilateral minimal pleural effusion but no overt pulmonary edema.  Assessment & Plan:   Paroxysmal A. fib with RVR -Presented with heart rate in the 130s to 140s for which she required Lopressor in the ED.   Patient was recently seen by Dr. Caryl Comes in the ED on 08/01/2020 and she was started on amiodarone 200 mg twice a day.   -Cardiology following. Amiodarone has been increased to 400 mg twice a day. Still tachycardic with dialysis. Follow further recommendations.  Hypotension -Blood pressure in the 80s on presentation. Blood pressure improving. Continue midodrine.   ESRD on hemodialysis -Nephrology following. Dialysis as per nephrology schedule.  Anemia of chronic disease -From renal failure.  Hemoglobin stable.  Monitor  Chronic macrocytosis -Questionable cause. Vitamin B12 and folate normal  Chronic diastolic CHF -Strict input and output.  Daily weights.  Volume managed by dialysis  Diabetes mellitus type 2 -Continue CBGs with SSI  Stage II sacral ulcer -Present on admission.  Continue local wound care. Pressure Injury 08/03/20 Sacrum Stage 2 -  Partial thickness loss of dermis presenting as a shallow open injury with a red, pink wound bed without slough. (Active)  08/03/20 2230  Location: Sacrum  Location  Orientation:   Staging: Stage 2 -  Partial thickness loss of dermis presenting as a shallow open injury with a red, pink wound bed without slough.  Wound Description (Comments):   Present on Admission: Yes    Chronic hypoxic respiratory failure -Continue supplemental oxygen.  Currently on 2 L oxygen.  Generalized deconditioning -Patient looks very deconditioned and chronically ill.  Daughter is not interested in palliative care discussions.  Daughter states that her mother is hard of hearing and does not understand the concept of these discussions; although the patient has expressed to multiple providers that she would like to pass away in peace; daughter does not want any more discussion regarding this.  Patient to remain full code.  DVT prophylaxis: SCDs Code Status: Full Family Communication: Spoke to daughter Kenney Houseman on phone on 08/05/2020 and on 2020-08-06 Disposition Plan: Status is: Inpatient.  Will need further input from cardiology regarding A. fib RVR.   Dispo: The patient is from: SNF              Anticipated d/c is to: SNF              Anticipated d/c date is: 1 day              Patient currently is not medically stable to d/c.   Consultants: Nephrology.  palliative care.  Cardiology  Procedures: None  Antimicrobials: None   Subjective: Patient seen and examined at bedside undergoing dialysis.  Poor historian. Still feels that her chest is tight. No overnight fever, vomiting reported. Objective: Vitals:   08/06/20 0900 08/06/20 0930 08/06/20 1000  08/06/20 1033  BP: (!) 131/45 (!) 138/54 (!) 111/45 (!) 100/42  Pulse: 85 (!) 57 95 (!) 126  Resp:   20 20  Temp:   97.8 F (36.6 C) (!) 97.5 F (36.4 C)  TempSrc:   Oral Oral  SpO2:   98% 99%  Weight:   56.3 kg     Intake/Output Summary (Last 24 hours) at 08/06/2020 1126 Last data filed at 08/06/2020 1000 Gross per 24 hour  Intake 180 ml  Output 1525 ml  Net -1345 ml   Filed Weights   08/06/20 0400 08/06/20  0645 08/06/20 1000  Weight: 60.8 kg 57.8 kg 56.3 kg    Examination:  General exam: Chronically ill-appearing. Currently on 2 L oxygen via nasal cannula.  Poor historian.  Hard of hearing. No distress. Respiratory system: Bilateral decreased breath sounds at bases with scattered crackles cardiovascular system: S1-S2 heard, rate controlled. Gastrointestinal system: Abdomen is nondistended, soft and nontender. Normal bowel sounds are heard  extremities: No cyanosis. Trace lower extremity edema present Central nervous system: Poor historian. Awake, answers only few questions. No focal neurological deficits. Moving extremities  skin: No obvious petechiae/rashes  psychiatry: Extremely flat affect.  Data Reviewed: I have personally reviewed following labs and imaging studies  CBC: Recent Labs  Lab 08/01/20 1052 08/01/20 1052 08/01/20 1115 08/03/20 1450 08/04/20 0342 08/05/20 0918 08/06/20 0802  WBC 6.5  --   --  7.4 5.4 7.3 6.4  NEUTROABS 4.5  --   --   --   --  4.9  --   HGB 11.1*   < > 12.9 10.8* 9.3* 10.1* 9.6*  HCT 37.3   < > 38.0 35.8* 30.8* 33.1* 32.0*  MCV 108.1*  --   --  107.5* 107.3* 105.4* 107.0*  PLT 177  --   --  184 168 172 184   < > = values in this interval not displayed.   Basic Metabolic Panel: Recent Labs  Lab 08/01/20 1052 08/01/20 1052 08/01/20 1115 08/01/20 1302 08/01/20 1639 08/03/20 1450 08/04/20 0342 08/05/20 0918 08/05/20 1428 08/06/20 0802  NA 140   < > 140  --   --  138 139 138  --  137  K 3.2*   < > 3.1*  --   --  4.5 4.0 4.2  --  4.3  CL 98   < > 97*  --   --  100 101 101  --  99  CO2 29  --   --   --   --  25 27 28   --  27  GLUCOSE 106*   < > 100*  --   --  105* 128* 166*  --  81  BUN 22   < > 24*  --   --  21 26* 11  --  18  CREATININE 3.63*   < > 3.40*  --   --  3.32* 3.81* 2.33*  --  2.97*  CALCIUM 8.3*  --   --   --   --  7.5* 7.4* 7.7*  --  8.2*  MG  --   --   --  1.9 2.0 1.9  --   --   --   --   PHOS  --   --   --   --   --   --    --   --  3.3 4.0   < > = values in this interval not displayed.   GFR: Estimated Creatinine Clearance: 13  mL/min (A) (by C-G formula based on SCr of 2.97 mg/dL (H)). Liver Function Tests: Recent Labs  Lab 08/01/20 1052 08/06/20 0802  AST 24  --   ALT 13  --   ALKPHOS 81  --   BILITOT 0.8  --   PROT 6.9  --   ALBUMIN 2.7* 2.4*   No results for input(s): LIPASE, AMYLASE in the last 168 hours. No results for input(s): AMMONIA in the last 168 hours. Coagulation Profile: No results for input(s): INR, PROTIME in the last 168 hours. Cardiac Enzymes: No results for input(s): CKTOTAL, CKMB, CKMBINDEX, TROPONINI in the last 168 hours. BNP (last 3 results) No results for input(s): PROBNP in the last 8760 hours. HbA1C: No results for input(s): HGBA1C in the last 72 hours. CBG: No results for input(s): GLUCAP in the last 168 hours. Lipid Profile: No results for input(s): CHOL, HDL, LDLCALC, TRIG, CHOLHDL, LDLDIRECT in the last 72 hours. Thyroid Function Tests: No results for input(s): TSH, T4TOTAL, FREET4, T3FREE, THYROIDAB in the last 72 hours. Anemia Panel: Recent Labs    08/05/20 1428  VITAMINB12 964*  FOLATE 67.5   Sepsis Labs: No results for input(s): PROCALCITON, LATICACIDVEN in the last 168 hours.  Recent Results (from the past 240 hour(s))  SARS Coronavirus 2 by RT PCR (hospital order, performed in [facility] hospital lab) Nasopharyngeal Nasopharyngeal Swab     Status: None   Collection Time: 08/03/20  8:10 PM   Specimen: Nasopharyngeal Swab  Result Value Ref Range Status   SARS Coronavirus 2 NEGATIVE NEGATIVE Final    Comment: (NOTE) SARS-CoV-2 target nucleic acids are NOT DETECTED.  The SARS-CoV-2 RNA is generally detectable in upper and lower respiratory specimens during the acute phase of infection. The lowest concentration of SARS-CoV-2 viral copies this assay can detect is 250 copies / mL. A negative result does not preclude SARS-CoV-2 infection and should  not be used as the sole basis for treatment or other patient management decisions.  A negative result may occur with improper specimen collection / handling, submission of specimen other than nasopharyngeal swab, presence of viral mutation(s) within the areas targeted by this assay, and inadequate number of viral copies (<250 copies / mL). A negative result must be combined with clinical observations, patient history, and epidemiological information.  Fact Sheet for Patients:   StrictlyIdeas.no  Fact Sheet for Healthcare Providers: BankingDealers.co.za  This test is not yet approved or  cleared by the [place] FDA and has been authorized for detection and/or diagnosis of SARS-CoV-2 by FDA under an Emergency Use Authorization (EUA).  This EUA will remain in effect (meaning this test can be used) for the duration of the COVID-19 declaration under Section 564(b)(1) of the Act, 21 U.S.C. section 360bbb-3(b)(1), unless the authorization is terminated or revoked sooner.  Performed at [facility], [place] [address] [place], [place] [address]          Radiology Studies: No results found.      Scheduled Meds: . allopurinol  100 mg Oral Daily  . amiodarone  400 mg Oral BID  . aspirin  81 mg Oral Daily  . azelastine  2 spray Each Nare BID  . brinzolamide  1 drop Both Eyes BID   Or  . brimonidine  1 drop Both Eyes BID  . calcium carbonate  1 tablet Oral BID  . Chlorhexidine Gluconate Cloth  6 each Topical Q0600  . darbepoetin (ARANESP) injection - DIALYSIS  40 mcg Intravenous Q Mon-HD  .  diazepam  5 mg Oral QHS  . diclofenac Sodium  2 g Topical QHS  . dimethicone  1 application Topical BID  . diphenoxylate-atropine  1 tablet Oral Q M,W,F-HD  . fluticasone  2 spray Each Nare Daily  . ipratropium  2 spray Each Nare Q12H  . loperamide  4 mg Oral Q M,W,F-HD  . loratadine  10 mg Oral Daily  . melatonin  10 mg Oral QHS  .  midodrine  10 mg Oral Q M,W,F-HD  . multivitamin  1 tablet Oral QHS  . traZODone  50 mg Oral QHS   Continuous Infusions:        Aline August, MD [facility] 08/06/2020, 11:26 AM

## 2020-08-06 NOTE — Progress Notes (Signed)
Dialysis cut short due to AFL with RVR. She is not tolerating HD due to incessant arrhythmias. Discussed with EP. They have recommended to renally dose digoxin and to continue amiodarone. We will continue to assess need for leadless ppm.   [place] T. Audie Box, [place]  [address] [place], [place] [address] [phone]  11:56 AM

## 2020-08-06 NOTE — TOC Progression Note (Signed)
Transition of Care [facility]) - Progression Note    Patient Details  Name: [name] James MRN: [id number] Date of Birth: 12/18/1936  Transition of Care [facility]) CM/SW [place], [place] Phone Number: 08/06/2020, 4:25 PM  Clinical Narrative:    CSW contacted [facility] regarding patient's discharge. CSW confirmed there are no additional needs for patient to return once medically stable. Updated covid test not needed if patient discharges Tuesday.  CSW contacted patient's daughter Kenney Houseman, awaiting a callback.    Expected Discharge Plan: [place] Barriers to Discharge: No Barriers Identified  Expected Discharge Plan and Services Expected Discharge Plan: [place] arrangements for the past 2 months: [place]                                       Social Determinants of Health (SDOH) Interventions    Readmission Risk Interventions No flowsheet data found.

## 2020-08-06 NOTE — Progress Notes (Signed)
Progress Note  Patient Name: [name] James Date of Encounter: 08/06/2020  [facility] Cardiologist: Sanda Klein, MD   Subjective   PT seen in dialysis. Tele shows sinus with rates in the 60s. She reported mild chest tightness overnight.   Inpatient Medications    Scheduled Meds: . allopurinol  100 mg Oral Daily  . amiodarone  400 mg Oral BID  . aspirin  81 mg Oral Daily  . azelastine  2 spray Each Nare BID  . brinzolamide  1 drop Both Eyes BID   Or  . brimonidine  1 drop Both Eyes BID  . calcium carbonate  1 tablet Oral BID  . Chlorhexidine Gluconate Cloth  6 each Topical Q0600  . darbepoetin (ARANESP) injection - DIALYSIS  40 mcg Intravenous Q Mon-HD  . diazepam  5 mg Oral QHS  . diclofenac Sodium  2 g Topical QHS  . dimethicone  1 application Topical BID  . diphenoxylate-atropine  1 tablet Oral Q M,W,F-HD  . fluticasone  2 spray Each Nare Daily  . ipratropium  2 spray Each Nare Q12H  . loperamide  4 mg Oral Q M,W,F-HD  . loratadine  10 mg Oral Daily  . melatonin  10 mg Oral QHS  . midodrine  10 mg Oral Q M,W,F-HD  . multivitamin  1 tablet Oral QHS  . traZODone  50 mg Oral QHS   Continuous Infusions:  PRN Meds: acetaminophen, cholestyramine, guaiFENesin, heparin, metoprolol tartrate, midodrine, ondansetron (ZOFRAN) IV   Vital Signs    Vitals:   08/05/20 2015 08/06/20 0014 08/06/20 0355 08/06/20 0400  BP: 101/74 92/76 103/79   Pulse: 74 63 70   Resp: 19 18 15    Temp: 97.9 F (36.6 C) 98.2 F (36.8 C) 97.8 F (36.6 C)   TempSrc: Oral Axillary Oral   SpO2: 99% 100% 100%   Weight:    60.8 kg    Intake/Output Summary (Last 24 hours) at 08/06/2020 0649 Last data filed at 08/05/2020 2000 Gross per 24 hour  Intake 180 ml  Output --  Net 180 ml   Last 3 Weights 08/06/2020 08/05/2020 08/04/2020  Weight (lbs) 134 lb 0.6 oz 131 lb 6.3 oz 130 lb 15.3 oz  Weight (kg) 60.8 kg 59.6 kg 59.4 kg      Telemetry    NSr, HR 60s - Personally Reviewed  ECG     No new - Personally Reviewed  Physical Exam   GEN: No acute distress.   Neck: No JVD Cardiac: RRR, no murmur, no rubs, or gallops.  Respiratory: Clear to auscultation bilaterally. GI: Soft, nontender, non-distended  MS: No edema; No deformity. Neuro:  Nonfocal  Psych: Normal affect   Labs    High Sensitivity Troponin:   Recent Labs  Lab 08/01/20 1052 08/01/20 1302 08/03/20 1450 08/05/20 0918  TROPONINIHS 26* 30* 20* 28*      Chemistry Recent Labs  Lab 08/01/20 1052 08/01/20 1115 08/03/20 1450 08/04/20 0342 08/05/20 0918  NA 140   < > 138 139 138  K 3.2*   < > 4.5 4.0 4.2  CL 98   < > 100 101 101  CO2 29   < > 25 27 28   GLUCOSE 106*   < > 105* 128* 166*  BUN 22   < > 21 26* 11  CREATININE 3.63*   < > 3.32* 3.81* 2.33*  CALCIUM 8.3*   < > 7.5* 7.4* 7.7*  PROT 6.9  --   --   --   --  ALBUMIN 2.7*  --   --   --   --   AST 24  --   --   --   --   ALT 13  --   --   --   --   ALKPHOS 81  --   --   --   --   BILITOT 0.8  --   --   --   --   GFRNONAA 11*   < > 12* 10* 19*  GFRAA 13*   < > 14* 12* 22*  ANIONGAP 13   < > 13 11 9    < > = values in this interval not displayed.     Hematology Recent Labs  Lab 08/03/20 1450 08/04/20 0342 08/05/20 0918  WBC 7.4 5.4 7.3  RBC 3.33* 2.87* 3.14*  HGB 10.8* 9.3* 10.1*  HCT 35.8* 30.8* 33.1*  MCV 107.5* 107.3* 105.4*  MCH 32.4 32.4 32.2  MCHC 30.2 30.2 30.5  RDW 15.3 15.2 15.2  PLT 184 168 172    BNP Recent Labs  Lab 08/01/20 1413  BNP 1,092.0*     DDimer No results for input(s): DDIMER in the last 168 hours.   Radiology    No results found.  Cardiac Studies   Echo 06/2018 Study Conclusions   - Left ventricle: The cavity size was normal. Wall thickness was  increased in a pattern of mild LVH. Systolic function was  vigorous. The estimated ejection fraction was in the range of 65%  to 70%. Wall motion was normal; there were no regional wall  motion abnormalities. The study is not  technically sufficient to  allow evaluation of LV diastolic function.  - Aortic valve: There was trivial regurgitation.  - Mitral valve: Calcified annulus.  - Left atrium: The atrium was severely dilated.  - Pulmonary arteries: Systolic pressure was mildly increased. PA  peak pressure: 44 mm Hg (S).   Impressions:   - Vigorous LV systolic function; mild LVH; sclerotic aortic valve  with trace AI; mildly elevated LVOT velocity of 2.3 m/s likely  related to vigorous LV systolic function; severe LAE; mild TR  with mild pulmonary hypertension.   Patient Profile     84 y.o. female with pmh of pafib (not on a/c due to hx GIB), tachy-brady syndrome (EP felt poor candidate for pacer), diastolic CHF, coronary calcium on CT, low risk myoview 11/2104, ESRD on HD, GERD, HLD, HTN, DM, anemia of chronic disease, h/o of breast cancer who is being seen for afib RVR.    Assessment & Plan    Afib RVR - Patient with recurrent rapid afib with associated chest pain especially with dialysis.  - Not a pacemaker candidate - no a/c due to h/o of GIB. CHADSVASC = 5 (age x2, HTN, DM1, vascular disease) - Patient just re-started on amiodarone  - Pt has history of bradycardia and metoprolol previously disconintued - amiodarone increased to 400mg  BID - She remains in sinus, no significant bradycardia on telemetry  Tachy-brady syndrome  - h/o of bradycardia  - not a candidate for pacer  Chronic diastolic CHF - volume per HD - Echo 2019 showed LVEF 65-70%  ESRD on HD - dialysis session limited by AF RVR and hypotension - nephrology following  CAD - low risk myoview in 2015 - Hs troponin 20>28 - not likely a cath candidate - meds limited by hypotension - continues to have intermittent chest pain although remains in sinus. continue conservative management  For questions or updates, please  contact [place] Please consult [email or web address] for contact info under        Signed, Anaysia Germer  Ninfa Meeker, PA-C  08/06/2020, 6:49 AM

## 2020-08-06 NOTE — Progress Notes (Signed)
[facility] Progress Note   Subjective:   Seen on HD, tolerating treatment, no complaints. HR controlled and stable. uf goal 3L   Objective Vitals:   08/06/20 0704 08/06/20 0730 08/06/20 0800 08/06/20 0830  BP: (!) 92/38 (!) 122/37 (!) 115/36 (!) 108/49  Pulse: 69 71 82 87  Resp:      Temp:      TempSrc:      SpO2:      Weight:       Physical Exam General: Chronically ill appearing female, alert and in NAD Heart: RRR, no murmurs, rubs or gallops Lungs: CTA anteriorly without wheezing, rhonchi or rales, bl chest expansion Abdomen: Soft, non-tender, non-distended, +BS Extremities: trace pedal edema bilateral lower extremities Dialysis Access: RUE AVG + bruit  Additional Objective Labs: Basic Metabolic Panel: Recent Labs  Lab 08/04/20 0342 08/05/20 0918 08/05/20 1428 08/06/20 0802  NA 139 138  --  137  K 4.0 4.2  --  4.3  CL 101 101  --  99  CO2 27 28  --  27  GLUCOSE 128* 166*  --  81  BUN 26* 11  --  18  CREATININE 3.81* 2.33*  --  2.97*  CALCIUM 7.4* 7.7*  --  8.2*  PHOS  --   --  3.3 4.0   Liver Function Tests: Recent Labs  Lab 08/01/20 1052 08/06/20 0802  AST 24  --   ALT 13  --   ALKPHOS 81  --   BILITOT 0.8  --   PROT 6.9  --   ALBUMIN 2.7* 2.4*   CBC: Recent Labs  Lab 08/01/20 1052 08/01/20 1115 08/03/20 1450 08/03/20 1450 08/04/20 0342 08/05/20 0918 08/06/20 0802  WBC 6.5   < > 7.4   < > 5.4 7.3 6.4  NEUTROABS 4.5  --   --   --   --  4.9  --   HGB 11.1*   < > 10.8*   < > 9.3* 10.1* 9.6*  HCT 37.3   < > 35.8*   < > 30.8* 33.1* 32.0*  MCV 108.1*  --  107.5*  --  107.3* 105.4* 107.0*  PLT 177   < > 184   < > 168 172 184   < > = values in this interval not displayed.   Blood Culture    Component Value Date/Time   SDES URINE, CATHETERIZED 03/14/2019 0430   SPECREQUEST  03/14/2019 0430    NONE Performed at [facility] [address], [place], [place] [address]    CULT >=100,000 COLONIES/mL ESCHERICHIA COLI (A)  03/14/2019 0430   REPTSTATUS 03/16/2019 FINAL 03/14/2019 0430    Studies/Results: No results found. Medications:  . allopurinol  100 mg Oral Daily  . amiodarone  400 mg Oral BID  . aspirin  81 mg Oral Daily  . azelastine  2 spray Each Nare BID  . brinzolamide  1 drop Both Eyes BID   Or  . brimonidine  1 drop Both Eyes BID  . calcium carbonate  1 tablet Oral BID  . Chlorhexidine Gluconate Cloth  6 each Topical Q0600  . darbepoetin (ARANESP) injection - DIALYSIS  40 mcg Intravenous Q Mon-HD  . diazepam  5 mg Oral QHS  . diclofenac Sodium  2 g Topical QHS  . dimethicone  1 application Topical BID  . diphenoxylate-atropine  1 tablet Oral Q M,W,F-HD  . fluticasone  2 spray Each Nare Daily  . ipratropium  2 spray Each  Nare Q12H  . loperamide  4 mg Oral Q M,W,F-HD  . loratadine  10 mg Oral Daily  . melatonin  10 mg Oral QHS  . midodrine  10 mg Oral Q M,W,F-HD  . multivitamin  1 tablet Oral QHS  . traZODone  50 mg Oral QHS    Dialysis Orders: Center: [facility]  on MWF. Time: 3 hours 45 min, 3K, 2.25Ca, BFR 400, DFR 800, EDW 54.5kg, RUE AVG, heparin 3300 unit bolus Mircera 30 mcg IVP q 2 weeks- last dose 07/23/20 Hectorol 1 mcg IV q HD  Assessment/Plan: 1.  A fib with RVR: Tachycardic and hypotensive at HD on 08/03/20, presented to ED on 08/01/20 with similar symptoms. On amiodarone. Received 1L fluids and lopressor. Symptoms recurred with HD last night. Currently in regular rhythm with rate controlled on dialysis, BP stable. Per primary/cardiology. Chronically low potassium, Keep K+ >4 2.  ESRD:  MWF schedule, using 4K bath. Next hd 8/18 3.  Hypertension/volume: BP soft with fluid overload as above, on amiodarone.Takes midodrine 10mg  before dialysis and another 10mg  mid treatment as needed, will give midodrine pre-HD here.  4.  Anemia: Hgb 9.2, on ESA, next dose due 8/16. No blood loss reported 5.  Metabolic bone disease: Corrected calcium 8.4. Will use added Ca bath with HD. Continue  hectorol. Continue Tums. Not on additional phosphorus binder.  6.  Nutrition:  Albumin low, add pro-stat supplement.    Gean Quint, MD [facility] 08/06/2020, 8:51 AM

## 2020-08-06 NOTE — Procedures (Signed)
I was present at this dialysis session. I have reviewed the session itself and made appropriate changes.   Filed Weights   08/05/20 0637 08/06/20 0400 08/06/20 0645  Weight: 59.6 kg 60.8 kg 57.8 kg    Recent Labs  Lab 08/06/20 0802  NA 137  K 4.3  CL 99  CO2 27  GLUCOSE 81  BUN 18  CREATININE 2.97*  CALCIUM 8.2*  PHOS 4.0    Recent Labs  Lab 08/01/20 1052 08/01/20 1115 08/04/20 0342 08/05/20 0918 08/06/20 0802  WBC 6.5   < > 5.4 7.3 6.4  NEUTROABS 4.5  --   --  4.9  --   HGB 11.1*   < > 9.3* 10.1* 9.6*  HCT 37.3   < > 30.8* 33.1* 32.0*  MCV 108.1*   < > 107.3* 105.4* 107.0*  PLT 177   < > 168 172 184   < > = values in this interval not displayed.    Scheduled Meds: . allopurinol  100 mg Oral Daily  . amiodarone  400 mg Oral BID  . aspirin  81 mg Oral Daily  . azelastine  2 spray Each Nare BID  . brinzolamide  1 drop Both Eyes BID   Or  . brimonidine  1 drop Both Eyes BID  . calcium carbonate  1 tablet Oral BID  . Chlorhexidine Gluconate Cloth  6 each Topical Q0600  . darbepoetin (ARANESP) injection - DIALYSIS  40 mcg Intravenous Q Mon-HD  . diazepam  5 mg Oral QHS  . diclofenac Sodium  2 g Topical QHS  . dimethicone  1 application Topical BID  . diphenoxylate-atropine  1 tablet Oral Q M,W,F-HD  . fluticasone  2 spray Each Nare Daily  . ipratropium  2 spray Each Nare Q12H  . loperamide  4 mg Oral Q M,W,F-HD  . loratadine  10 mg Oral Daily  . melatonin  10 mg Oral QHS  . midodrine  10 mg Oral Q M,W,F-HD  . multivitamin  1 tablet Oral QHS  . traZODone  50 mg Oral QHS   Continuous Infusions: PRN Meds:.acetaminophen, cholestyramine, guaiFENesin, heparin, metoprolol tartrate, midodrine, ondansetron (ZOFRAN) IV   Gean Quint, MD [facility] 08/06/2020, 8:51 AM

## 2020-08-07 ENCOUNTER — Inpatient Hospital Stay (HOSPITAL_COMMUNITY): Payer: Medicare Other

## 2020-08-07 DIAGNOSIS — I342 Nonrheumatic mitral (valve) stenosis: Secondary | ICD-10-CM

## 2020-08-07 LAB — CBC WITH DIFFERENTIAL/PLATELET
Abs Immature Granulocytes: 0.02 10*3/uL (ref 0.00–0.07)
Basophils Absolute: 0.1 10*3/uL (ref 0.0–0.1)
Basophils Relative: 1 %
Eosinophils Absolute: 0.5 10*3/uL (ref 0.0–0.5)
Eosinophils Relative: 7 %
HCT: 33.1 % — ABNORMAL LOW (ref 36.0–46.0)
Hemoglobin: 10 g/dL — ABNORMAL LOW (ref 12.0–15.0)
Immature Granulocytes: 0 %
Lymphocytes Relative: 33 %
Lymphs Abs: 2.1 10*3/uL (ref 0.7–4.0)
MCH: 32.5 pg (ref 26.0–34.0)
MCHC: 30.2 g/dL (ref 30.0–36.0)
MCV: 107.5 fL — ABNORMAL HIGH (ref 80.0–100.0)
Monocytes Absolute: 0.6 10*3/uL (ref 0.1–1.0)
Monocytes Relative: 8 %
Neutro Abs: 3.3 10*3/uL (ref 1.7–7.7)
Neutrophils Relative %: 51 %
Platelets: 174 10*3/uL (ref 150–400)
RBC: 3.08 MIL/uL — ABNORMAL LOW (ref 3.87–5.11)
RDW: 15.2 % (ref 11.5–15.5)
WBC: 6.6 10*3/uL (ref 4.0–10.5)
nRBC: 0 % (ref 0.0–0.2)

## 2020-08-07 LAB — ECHOCARDIOGRAM COMPLETE
Area-P 1/2: 2.26 cm2
Single Plane A4C EF: 66.9 %
Weight: 2112.89 oz

## 2020-08-07 LAB — BASIC METABOLIC PANEL
Anion gap: 7 (ref 5–15)
BUN: 12 mg/dL (ref 8–23)
CO2: 29 mmol/L (ref 22–32)
Calcium: 8.3 mg/dL — ABNORMAL LOW (ref 8.9–10.3)
Chloride: 100 mmol/L (ref 98–111)
Creatinine, Ser: 2.28 mg/dL — ABNORMAL HIGH (ref 0.44–1.00)
GFR calc Af Amer: 22 mL/min — ABNORMAL LOW (ref >60)
GFR calc non Af Amer: 19 mL/min — ABNORMAL LOW (ref >60)
Glucose, Bld: 93 mg/dL (ref 70–99)
Potassium: 4.2 mmol/L (ref 3.5–5.1)
Sodium: 136 mmol/L (ref 135–145)

## 2020-08-07 MED ORDER — CHLORHEXIDINE GLUCONATE CLOTH 2 % EX PADS
6.0000 | MEDICATED_PAD | Freq: Every day | CUTANEOUS | Status: DC
  Administered 2020-08-08: 6 via TOPICAL

## 2020-08-07 MED ORDER — PERFLUTREN LIPID MICROSPHERE
1.0000 mL | INTRAVENOUS | Status: AC | PRN
  Administered 2020-08-07: 2 mL via INTRAVENOUS
  Filled 2020-08-07: qty 10

## 2020-08-07 MED ORDER — PROSOURCE PLUS PO LIQD
30.0000 mL | Freq: Two times a day (BID) | ORAL | Status: DC
  Filled 2020-08-07 (×8): qty 30

## 2020-08-07 MED ORDER — MIDODRINE HCL 5 MG PO TABS
10.0000 mg | ORAL_TABLET | Freq: Once | ORAL | Status: AC
  Administered 2020-08-07: 10 mg via ORAL
  Filled 2020-08-07: qty 2

## 2020-08-07 NOTE — Progress Notes (Signed)
Patient ID: ARLINE KETTER, female   DOB: 1936-02-15, 84 y.o.   MRN: [id number]  PROGRESS NOTE    NEALIE MCHATTON  ZPH:[id number] DOB: 01/27/1936 DOA: 08/03/2020 PCP: Patrick Jupiter   Brief Narrative:  84 year old female with history of ESRD on hemodialysis, GERD, paroxysmal A. fib, hyperlipidemia, hypertension, diabetes mellitus type 2 presented with chest discomfort and palpitation on 08/03/2020 when she was having hemodialysis.  She was sent to the ER before completing her hemodialysis.  In the ED, her heart rate was in the 130s to 140s for which she was given Lopressor.  Subsequently she was hypotensive and placed in observation.  Nephrology was consulted.  Chest x-ray showed bilateral minimal pleural effusion but no overt pulmonary edema.  Subsequently cardiology was consulted for A. fib with RVR.  Assessment & Plan:   Paroxysmal A. fib with RVR -Presented with heart rate in the 130s to 140s for which she required Lopressor in the ED.   Patient was recently seen by Dr. Caryl Comes in the ED on 08/01/2020 and she was started on amiodarone 200 mg twice a day.   -Cardiology following. Amiodarone has been increased to 400 mg twice a day. Still tachycardic with dialysis.  She has been started on renally dosed digoxin.  There is some consideration about pacemaker placement.  Follow recommendations.  Hypotension -Blood pressure in the 80s on presentation. Blood pressure on the lower side overnight.  Continue midodrine.   ESRD on hemodialysis -Nephrology following. Dialysis as per nephrology schedule.  Anemia of chronic disease -From renal failure.  Hemoglobin stable.  Monitor  Chronic macrocytosis -Questionable cause. Vitamin B12 and folate normal  Chronic diastolic CHF -Strict input and output.  Daily weights.  Volume managed by dialysis  Diabetes mellitus type 2 -Continue CBGs with SSI  Stage II sacral ulcer -Present on admission.  Continue local wound care. Pressure Injury 08/03/20 Sacrum  Stage 2 -  Partial thickness loss of dermis presenting as a shallow open injury with a red, pink wound bed without slough. (Active)  08/03/20 2230  Location: Sacrum  Location Orientation:   Staging: Stage 2 -  Partial thickness loss of dermis presenting as a shallow open injury with a red, pink wound bed without slough.  Wound Description (Comments):   Present on Admission: Yes    Chronic hypoxic respiratory failure -Continue supplemental oxygen.  Currently on 2 L oxygen.  Generalized deconditioning -Patient looks very deconditioned and chronically ill.  Daughter is not interested in palliative care discussions.  Daughter states that her mother is hard of hearing and does not understand the concept of these discussions; although the patient has expressed to multiple providers that she would like to pass away in peace; daughter does not want any more discussion regarding this.  Patient to remain full code.  DVT prophylaxis: SCDs Code Status: Full Family Communication: Spoke to daughter Kenney Houseman on phone on 08/05/2020 and on 08/06/2020 and 08/07/2020. Disposition Plan: Status is: Inpatient.  Patient very hypotensive overnight and not stable for discharge.   Dispo: The patient is from: SNF              Anticipated d/c is to: SNF              Anticipated d/c date is: 2 days              Patient currently is not medically stable to d/c.   Consultants: Nephrology.  palliative care.  Cardiology  Procedures: None  Antimicrobials: None   Subjective:  Patient seen and examined at bedside.  Extremely poor historian.  Still complains of chest tightness.  Feels intermittently dizzy.  No overnight fever, vomiting, worsening shortness of breath reported. Objective: Vitals:   08/06/20 1356 08/06/20 1955 08/06/20 2105 08/07/20 0333  BP:  (!) 83/50 (!) 90/56 (!) 94/20  Pulse: 89 75 70 64  Resp:  15 20 18   Temp:  97.7 F (36.5 C)  97.8 F (36.6 C)  TempSrc:  Oral  Oral  SpO2:  98% 100% 100%    Weight:    59.9 kg    Intake/Output Summary (Last 24 hours) at 08/07/2020 0748 Last data filed at 08/06/2020 1000 Gross per 24 hour  Intake --  Output 1525 ml  Net -1525 ml   Filed Weights   08/06/20 0645 08/06/20 1000 08/07/20 0333  Weight: 57.8 kg 56.3 kg 59.9 kg    Examination:  General exam: Chronically ill-appearing.  On 2 L nasal cannula oxygen.  Poor historian.  Hard of hearing.  No acute distress. Respiratory system: Bilateral decreased breath sounds at bases with some crackles.  No wheezing  cardiovascular system: Rate controlled, S1-S2 heard Gastrointestinal system: Abdomen is nondistended, soft and nontender.  Bowel sounds are heard  extremities: Bilateral lower extremity edema present.  No clubbing or cyanosis Central nervous system: Extremely poor historian.  Awake, does not participate in conversation much.  No focal neurological deficits. Moving extremities  skin: No obvious ecchymosis or lesions psychiatry: Affect is very flat.  Data Reviewed: I have personally reviewed following labs and imaging studies  CBC: Recent Labs  Lab 08/01/20 1052 08/01/20 1115 08/03/20 1450 08/04/20 0342 08/05/20 0918 08/06/20 0802 08/07/20 0335  WBC 6.5   < > 7.4 5.4 7.3 6.4 6.6  NEUTROABS 4.5  --   --   --  4.9  --  3.3  HGB 11.1*   < > 10.8* 9.3* 10.1* 9.6* 10.0*  HCT 37.3   < > 35.8* 30.8* 33.1* 32.0* 33.1*  MCV 108.1*   < > 107.5* 107.3* 105.4* 107.0* 107.5*  PLT 177   < > 184 168 172 184 174   < > = values in this interval not displayed.   Basic Metabolic Panel: Recent Labs  Lab 08/01/20 1115 08/01/20 1302 08/01/20 1639 08/03/20 1450 08/04/20 0342 08/05/20 0918 08/05/20 1428 08/06/20 0802 08/07/20 0335  NA   < >  --   --  138 139 138  --  137 136  K   < >  --   --  4.5 4.0 4.2  --  4.3 4.2  CL   < >  --   --  100 101 101  --  99 100  CO2   < >  --   --  25 27 28   --  27 29  GLUCOSE   < >  --   --  105* 128* 166*  --  81 93  BUN   < >  --   --  21 26* 11   --  18 12  CREATININE   < >  --   --  3.32* 3.81* 2.33*  --  2.97* 2.28*  CALCIUM   < >  --   --  7.5* 7.4* 7.7*  --  8.2* 8.3*  MG  --  1.9 2.0 1.9  --   --   --   --   --   PHOS  --   --   --   --   --   --  3.3 4.0  --    < > = values in this interval not displayed.   GFR: Estimated Creatinine Clearance: 17.1 mL/min (A) (by C-G formula based on SCr of 2.28 mg/dL (H)). Liver Function Tests: Recent Labs  Lab 08/01/20 1052 08/06/20 0802  AST 24  --   ALT 13  --   ALKPHOS 81  --   BILITOT 0.8  --   PROT 6.9  --   ALBUMIN 2.7* 2.4*   No results for input(s): LIPASE, AMYLASE in the last 168 hours. No results for input(s): AMMONIA in the last 168 hours. Coagulation Profile: No results for input(s): INR, PROTIME in the last 168 hours. Cardiac Enzymes: No results for input(s): CKTOTAL, CKMB, CKMBINDEX, TROPONINI in the last 168 hours. BNP (last 3 results) No results for input(s): PROBNP in the last 8760 hours. HbA1C: No results for input(s): HGBA1C in the last 72 hours. CBG: No results for input(s): GLUCAP in the last 168 hours. Lipid Profile: No results for input(s): CHOL, HDL, LDLCALC, TRIG, CHOLHDL, LDLDIRECT in the last 72 hours. Thyroid Function Tests: No results for input(s): TSH, T4TOTAL, FREET4, T3FREE, THYROIDAB in the last 72 hours. Anemia Panel: Recent Labs    08/05/20 1428  VITAMINB12 964*  FOLATE 67.5   Sepsis Labs: No results for input(s): PROCALCITON, LATICACIDVEN in the last 168 hours.  Recent Results (from the past 240 hour(s))  SARS Coronavirus 2 by RT PCR (hospital order, performed in [facility] hospital lab) Nasopharyngeal Nasopharyngeal Swab     Status: None   Collection Time: 08/03/20  8:10 PM   Specimen: Nasopharyngeal Swab  Result Value Ref Range Status   SARS Coronavirus 2 NEGATIVE NEGATIVE Final    Comment: (NOTE) SARS-CoV-2 target nucleic acids are NOT DETECTED.  The SARS-CoV-2 RNA is generally detectable in upper and lower respiratory  specimens during the acute phase of infection. The lowest concentration of SARS-CoV-2 viral copies this assay can detect is 250 copies / mL. A negative result does not preclude SARS-CoV-2 infection and should not be used as the sole basis for treatment or other patient management decisions.  A negative result may occur with improper specimen collection / handling, submission of specimen other than nasopharyngeal swab, presence of viral mutation(s) within the areas targeted by this assay, and inadequate number of viral copies (<250 copies / mL). A negative result must be combined with clinical observations, patient history, and epidemiological information.  Fact Sheet for Patients:   StrictlyIdeas.no  Fact Sheet for Healthcare Providers: BankingDealers.co.za  This test is not yet approved or  cleared by the [place] FDA and has been authorized for detection and/or diagnosis of SARS-CoV-2 by FDA under an Emergency Use Authorization (EUA).  This EUA will remain in effect (meaning this test can be used) for the duration of the COVID-19 declaration under Section 564(b)(1) of the Act, 21 U.S.C. section 360bbb-3(b)(1), unless the authorization is terminated or revoked sooner.  Performed at [facility], [place] [address] [place], [place] [address]          Radiology Studies: No results found.      Scheduled Meds: . allopurinol  100 mg Oral Daily  . amiodarone  400 mg Oral BID  . aspirin  81 mg Oral Daily  . azelastine  2 spray Each Nare BID  . brinzolamide  1 drop Both Eyes BID   Or  . brimonidine  1 drop Both Eyes BID  . calcium carbonate  1 tablet Oral BID  .  Chlorhexidine Gluconate Cloth  6 each Topical Q0600  . darbepoetin (ARANESP) injection - DIALYSIS  40 mcg Intravenous Q Mon-HD  . diazepam  5 mg Oral QHS  . diclofenac Sodium  2 g Topical QHS  . [START ON 08/08/2020] digoxin  0.0625 mg Oral Q M,W,F  .  dimethicone  1 application Topical BID  . diphenoxylate-atropine  1 tablet Oral Q M,W,F-HD  . fluticasone  2 spray Each Nare Daily  . ipratropium  2 spray Each Nare Q12H  . loperamide  4 mg Oral Q M,W,F-HD  . loratadine  10 mg Oral Daily  . melatonin  10 mg Oral QHS  . midodrine  10 mg Oral Q M,W,F-HD  . multivitamin  1 tablet Oral QHS  . traZODone  50 mg Oral QHS   Continuous Infusions:        Aline August, MD [facility] 08/07/2020, 7:48 AM

## 2020-08-07 NOTE — Progress Notes (Signed)
[facility] Progress Note   Subjective:  Seen in room. No CP or palpitations currently. She is hypotensive. C/o mild dyspnea. Did not tolerate UF with HD yesterday d/t A-fib. Cards starting digoxin.  Objective Vitals:   08/06/20 1955 08/06/20 2105 08/07/20 0333 08/07/20 0824  BP: (!) 83/50 (!) 90/56 (!) 94/20 (!) 74/58  Pulse: 75 70 64 75  Resp: 15 20 18 20   Temp: 97.7 F (36.5 C)  97.8 F (36.6 C) 97.7 F (36.5 C)  TempSrc: Oral  Oral Oral  SpO2: 98% 100% 100% 100%  Weight:   59.9 kg    Physical Exam General: Frail woman, NAD. On nasal O2 Heart: RRR; no murmur Lungs: CTA anteriorly, no rales noted Abdomen: soft, non-tender Extremities: trace BLE edema Dialysis Access: R AVG + bruit  Additional Objective Labs: Basic Metabolic Panel: Recent Labs  Lab 08/05/20 0918 08/05/20 1428 08/06/20 0802 08/07/20 0335  NA 138  --  137 136  K 4.2  --  4.3 4.2  CL 101  --  99 100  CO2 28  --  27 29  GLUCOSE 166*  --  81 93  BUN 11  --  18 12  CREATININE 2.33*  --  2.97* 2.28*  CALCIUM 7.7*  --  8.2* 8.3*  PHOS  --  3.3 4.0  --    Liver Function Tests: Recent Labs  Lab 08/01/20 1052 08/06/20 0802  AST 24  --   ALT 13  --   ALKPHOS 81  --   BILITOT 0.8  --   PROT 6.9  --   ALBUMIN 2.7* 2.4*   CBC: Recent Labs  Lab 08/01/20 1052 08/01/20 1115 08/03/20 1450 08/03/20 1450 08/04/20 0342 08/04/20 0342 08/05/20 0918 08/06/20 0802 08/07/20 0335  WBC 6.5   < > 7.4   < > 5.4   < > 7.3 6.4 6.6  NEUTROABS 4.5  --   --   --   --   --  4.9  --  3.3  HGB 11.1*   < > 10.8*   < > 9.3*   < > 10.1* 9.6* 10.0*  HCT 37.3   < > 35.8*   < > 30.8*   < > 33.1* 32.0* 33.1*  MCV 108.1*   < > 107.5*  --  107.3*  --  105.4* 107.0* 107.5*  PLT 177   < > 184   < > 168   < > 172 184 174   < > = values in this interval not displayed.   Medications:  . allopurinol  100 mg Oral Daily  . amiodarone  400 mg Oral BID  . aspirin  81 mg Oral Daily  . azelastine  2 spray  Each Nare BID  . brinzolamide  1 drop Both Eyes BID   Or  . brimonidine  1 drop Both Eyes BID  . calcium carbonate  1 tablet Oral BID  . Chlorhexidine Gluconate Cloth  6 each Topical Q0600  . darbepoetin (ARANESP) injection - DIALYSIS  40 mcg Intravenous Q Mon-HD  . diazepam  5 mg Oral QHS  . diclofenac Sodium  2 g Topical QHS  . [START ON 08/08/2020] digoxin  0.0625 mg Oral Q M,W,F  . dimethicone  1 application Topical BID  . diphenoxylate-atropine  1 tablet Oral Q M,W,F-HD  . fluticasone  2 spray Each Nare Daily  . ipratropium  2 spray Each Nare Q12H  . loperamide  4 mg Oral Q M,W,F-HD  .  loratadine  10 mg Oral Daily  . melatonin  10 mg Oral QHS  . midodrine  10 mg Oral Q M,W,F-HD  . multivitamin  1 tablet Oral QHS  . traZODone  50 mg Oral QHS    [facility]: 3 hours 45 min, 3K, 2.25Ca, BFR 400, DFR 800, EDW 54.5kg, RUE AVG, heparin 3300 unit bolus Mircera 30 mcg IVP q 2 weeks- last dose 07/23/20 Hectorol 1 mcg IV q HD  Assessment/Plan: 1. A fib with BWG:[id number] and hypotensive at HD on 08/03/20, 8/14, 8/16. On amiodarone, midodrine, and just started on digoxin. Per primary/cardiology. Chronically low potassium, Keep K+ >4 2. ESRD:Continue HD on MWF schedule, using 4K bath -> next HD 8/18. 3. Hypertension/volume:BP low with fluid overload as above, on amiodarone + dig now. Takes midodrine 10mg  before dialysis and another 10mg  mid treatment as needed. ?Consider TID dosing. 4. Anemia:Hgb 10. Continue Aranesp 48mcg q Monday. 5. Metabolic bone disease:CorrCa/Phos ok. Continue Tums BID. Not on other binder. 6. Nutrition:Albumin low, start protein supplements.  [name], PA-C 08/07/2020, 10:34 AM  [facility]

## 2020-08-07 NOTE — Progress Notes (Signed)
Cardiology Progress Note  Patient ID: [name] James MRN: [id number] DOB: 1936/01/07 Date of Encounter: 08/07/2020  Primary Cardiologist: Sanda Klein, MD  Subjective   Chief Complaint: Feeling better  HPI: Did not tolerate HD due to AFL. Plan to give dig per EP.   ROS:  All other ROS reviewed and negative. Pertinent positives noted in the HPI.     Inpatient Medications  Scheduled Meds: . allopurinol  100 mg Oral Daily  . amiodarone  400 mg Oral BID  . aspirin  81 mg Oral Daily  . azelastine  2 spray Each Nare BID  . brinzolamide  1 drop Both Eyes BID   Or  . brimonidine  1 drop Both Eyes BID  . calcium carbonate  1 tablet Oral BID  . Chlorhexidine Gluconate Cloth  6 each Topical Q0600  . darbepoetin (ARANESP) injection - DIALYSIS  40 mcg Intravenous Q Mon-HD  . diazepam  5 mg Oral QHS  . diclofenac Sodium  2 g Topical QHS  . [START ON 08/08/2020] digoxin  0.0625 mg Oral Q M,W,F  . dimethicone  1 application Topical BID  . diphenoxylate-atropine  1 tablet Oral Q M,W,F-HD  . fluticasone  2 spray Each Nare Daily  . ipratropium  2 spray Each Nare Q12H  . loperamide  4 mg Oral Q M,W,F-HD  . loratadine  10 mg Oral Daily  . melatonin  10 mg Oral QHS  . midodrine  10 mg Oral Q M,W,F-HD  . multivitamin  1 tablet Oral QHS  . traZODone  50 mg Oral QHS   Continuous Infusions:  PRN Meds: acetaminophen, cholestyramine, guaiFENesin, metoprolol tartrate, midodrine, ondansetron (ZOFRAN) IV   Vital Signs   Vitals:   08/06/20 1955 08/06/20 2105 08/07/20 0333 08/07/20 0824  BP: (!) 83/50 (!) 90/56 (!) 94/20 (!) 74/58  Pulse: 75 70 64 75  Resp: 15 20 18 20   Temp: 97.7 F (36.5 C)  97.8 F (36.6 C) 97.7 F (36.5 C)  TempSrc: Oral  Oral Oral  SpO2: 98% 100% 100% 100%  Weight:   59.9 kg     Intake/Output Summary (Last 24 hours) at 08/07/2020 0958 Last data filed at 08/07/2020 0700 Gross per 24 hour  Intake 60 ml  Output 1525 ml  Net -1465 ml   Last 3 Weights 08/07/2020  08/06/2020 08/06/2020  Weight (lbs) 132 lb 0.9 oz 124 lb 1.9 oz 127 lb 6.8 oz  Weight (kg) 59.9 kg 56.3 kg 57.8 kg      Telemetry  Overnight telemetry shows NSR 70s, which I personally reviewed.   ECG  The most recent ECG shows Afib 124 bpm, which I personally reviewed.   Physical Exam   Vitals:   08/06/20 1955 08/06/20 2105 08/07/20 0333 08/07/20 0824  BP: (!) 83/50 (!) 90/56 (!) 94/20 (!) 74/58  Pulse: 75 70 64 75  Resp: 15 20 18 20   Temp: 97.7 F (36.5 C)  97.8 F (36.6 C) 97.7 F (36.5 C)  TempSrc: Oral  Oral Oral  SpO2: 98% 100% 100% 100%  Weight:   59.9 kg      Intake/Output Summary (Last 24 hours) at 08/07/2020 0958 Last data filed at 08/07/2020 0700 Gross per 24 hour  Intake 60 ml  Output 1525 ml  Net -1465 ml    Last 3 Weights 08/07/2020 08/06/2020 08/06/2020  Weight (lbs) 132 lb 0.9 oz 124 lb 1.9 oz 127 lb 6.8 oz  Weight (kg) 59.9 kg 56.3 kg 57.8 kg  Body mass index is 21.98 kg/m.   General: Well nourished, well developed, in no acute distress Head: Atraumatic, normal size  Eyes: PEERLA, EOMI  Neck: Supple, no JVD Endocrine: No thryomegaly Cardiac: Normal S1, S2; RRR; 2/6 SEM Lungs: Clear to auscultation bilaterally, no wheezing, rhonchi or rales  Abd: Soft, nontender, no hepatomegaly  Ext: No edema, pulses 2+ Musculoskeletal: No deformities, BUE and BLE strength normal and equal Skin: Warm and dry, no rashes   Neuro: Alert and oriented to person, place, time, and situation, CNII-XII grossly intact, no focal deficits  Psych: Normal mood and affect   Labs  High Sensitivity Troponin:   Recent Labs  Lab 08/01/20 1052 08/01/20 1302 08/03/20 1450 08/05/20 0918  TROPONINIHS 26* 30* 20* 28*     Cardiac EnzymesNo results for input(s): TROPONINI in the last 168 hours. No results for input(s): TROPIPOC in the last 168 hours.  Chemistry Recent Labs  Lab 08/01/20 1052 08/01/20 1115 08/05/20 0918 08/06/20 0802 08/07/20 0335  NA 140   < > 138 137 136    K 3.2*   < > 4.2 4.3 4.2  CL 98   < > 101 99 100  CO2 29   < > 28 27 29   GLUCOSE 106*   < > 166* 81 93  BUN 22   < > 11 18 12   CREATININE 3.63*   < > 2.33* 2.97* 2.28*  CALCIUM 8.3*   < > 7.7* 8.2* 8.3*  PROT 6.9  --   --   --   --   ALBUMIN 2.7*  --   --  2.4*  --   AST 24  --   --   --   --   ALT 13  --   --   --   --   ALKPHOS 81  --   --   --   --   BILITOT 0.8  --   --   --   --   GFRNONAA 11*   < > 19* 14* 19*  GFRAA 13*   < > 22* 16* 22*  ANIONGAP 13   < > 9 11 7    < > = values in this interval not displayed.    Hematology Recent Labs  Lab 08/05/20 0918 08/06/20 0802 08/07/20 0335  WBC 7.3 6.4 6.6  RBC 3.14* 2.99* 3.08*  HGB 10.1* 9.6* 10.0*  HCT 33.1* 32.0* 33.1*  MCV 105.4* 107.0* 107.5*  MCH 32.2 32.1 32.5  MCHC 30.5 30.0 30.2  RDW 15.2 15.0 15.2  PLT 172 184 174   BNP Recent Labs  Lab 08/01/20 1413  BNP 1,092.0*    DDimer No results for input(s): DDIMER in the last 168 hours.   Radiology  No results found.  Cardiac Studies  TTE 2019 EF 65-70%  Patient Profile  [name] James is a 84 y.o. female with ESRD, paroxysmal AF (no AC due to GIB), tachy-brady no PPM due to no vascular asccess, HFpEF, CAD, HTN admitted 08/03/2020 with CP and tachycardia during HD.   Assessment & Plan   1. Atrial fibrillation/Flutter with tachy-brady syndrome -no access for transvenous ppm -did not tolerate HD yesterday due to AFL with RVR -back in NSR -on amiodarone 400 mg BID -EP consulted for leadless ppm and want to try digoxin later. Will have HD tomorrow. We will see how she does. No BB/CCB due to profound bradycardia.   2. 3/6 SEM -murmur of AS. Will repeat echo as has not had one  since 2019  3. HFpEF/ESRD -volume managed by HD.   For questions or updates, please contact [place] Please consult [email or web address] for contact info under   Time Spent with Patient: I have spent a total of 25 minutes with patient reviewing hospital notes, telemetry, EKGs,  labs and examining the patient as well as establishing an assessment and plan that was discussed with the patient.  > 50% of time was spent in direct patient care.    Signed, Addison Naegeli. Audie Box, [place]  08/07/2020 9:58 AM

## 2020-08-07 NOTE — Progress Notes (Signed)
This PMT chaplain understands Pt. declined Palliative Care services.

## 2020-08-07 NOTE — Progress Notes (Signed)
  Echocardiogram 2D Echocardiogram has been performed.  Michiel Cowboy 08/07/2020, 11:46 AM

## 2020-08-08 LAB — CBC WITH DIFFERENTIAL/PLATELET
Abs Immature Granulocytes: 0.02 10*3/uL (ref 0.00–0.07)
Basophils Absolute: 0.1 10*3/uL (ref 0.0–0.1)
Basophils Relative: 1 %
Eosinophils Absolute: 0.3 10*3/uL (ref 0.0–0.5)
Eosinophils Relative: 5 %
HCT: 33 % — ABNORMAL LOW (ref 36.0–46.0)
Hemoglobin: 9.8 g/dL — ABNORMAL LOW (ref 12.0–15.0)
Immature Granulocytes: 0 %
Lymphocytes Relative: 29 %
Lymphs Abs: 2.1 10*3/uL (ref 0.7–4.0)
MCH: 31.9 pg (ref 26.0–34.0)
MCHC: 29.7 g/dL — ABNORMAL LOW (ref 30.0–36.0)
MCV: 107.5 fL — ABNORMAL HIGH (ref 80.0–100.0)
Monocytes Absolute: 0.5 10*3/uL (ref 0.1–1.0)
Monocytes Relative: 8 %
Neutro Abs: 4.1 10*3/uL (ref 1.7–7.7)
Neutrophils Relative %: 57 %
Platelets: 209 10*3/uL (ref 150–400)
RBC: 3.07 MIL/uL — ABNORMAL LOW (ref 3.87–5.11)
RDW: 14.9 % (ref 11.5–15.5)
WBC: 7.1 10*3/uL (ref 4.0–10.5)
nRBC: 0 % (ref 0.0–0.2)

## 2020-08-08 LAB — BASIC METABOLIC PANEL
Anion gap: 7 (ref 5–15)
BUN: 20 mg/dL (ref 8–23)
CO2: 28 mmol/L (ref 22–32)
Calcium: 8.3 mg/dL — ABNORMAL LOW (ref 8.9–10.3)
Chloride: 101 mmol/L (ref 98–111)
Creatinine, Ser: 3.19 mg/dL — ABNORMAL HIGH (ref 0.44–1.00)
GFR calc Af Amer: 15 mL/min — ABNORMAL LOW (ref >60)
GFR calc non Af Amer: 13 mL/min — ABNORMAL LOW (ref >60)
Glucose, Bld: 73 mg/dL (ref 70–99)
Potassium: 4.5 mmol/L (ref 3.5–5.1)
Sodium: 136 mmol/L (ref 135–145)

## 2020-08-08 LAB — HEPATITIS B SURFACE ANTIGEN: Hepatitis B Surface Ag: NONREACTIVE

## 2020-08-08 LAB — GLUCOSE, CAPILLARY: Glucose-Capillary: 131 mg/dL — ABNORMAL HIGH (ref 70–99)

## 2020-08-08 MED ORDER — BISACODYL 10 MG RE SUPP
10.0000 mg | Freq: Once | RECTAL | Status: DC
  Filled 2020-08-08: qty 1

## 2020-08-08 NOTE — Progress Notes (Addendum)
Pt. Tolerated HD tx. HR remained NSR on bedside monitor throughout treatment. Pt stable

## 2020-08-08 NOTE — Progress Notes (Signed)
Cardiology Progress Note  Patient ID: [name] James MRN: [id number] DOB: 1936-03-19 Date of Encounter: 08/08/2020  Primary Cardiologist: Sanda Klein, MD  Subjective   Chief Complaint: Nausea  HPI: Plans for HD today. AFL has been an issue.   ROS:  All other ROS reviewed and negative. Pertinent positives noted in the HPI.     Inpatient Medications  Scheduled Meds: . (feeding supplement) PROSource Plus  30 mL Oral BID BM  . allopurinol  100 mg Oral Daily  . amiodarone  400 mg Oral BID  . aspirin  81 mg Oral Daily  . azelastine  2 spray Each Nare BID  . bisacodyl  10 mg Rectal Once  . brinzolamide  1 drop Both Eyes BID   Or  . brimonidine  1 drop Both Eyes BID  . calcium carbonate  1 tablet Oral BID  . Chlorhexidine Gluconate Cloth  6 each Topical Q0600  . darbepoetin (ARANESP) injection - DIALYSIS  40 mcg Intravenous Q Mon-HD  . diazepam  5 mg Oral QHS  . diclofenac Sodium  2 g Topical QHS  . digoxin  0.0625 mg Oral Q M,W,F  . dimethicone  1 application Topical BID  . diphenoxylate-atropine  1 tablet Oral Q M,W,F-HD  . fluticasone  2 spray Each Nare Daily  . ipratropium  2 spray Each Nare Q12H  . loperamide  4 mg Oral Q M,W,F-HD  . loratadine  10 mg Oral Daily  . melatonin  10 mg Oral QHS  . midodrine  10 mg Oral Q M,W,F-HD  . multivitamin  1 tablet Oral QHS  . traZODone  50 mg Oral QHS   Continuous Infusions:  PRN Meds: acetaminophen, cholestyramine, guaiFENesin, metoprolol tartrate, midodrine, ondansetron (ZOFRAN) IV   Vital Signs   Vitals:   08/07/20 2006 08/07/20 2336 08/08/20 0418 08/08/20 0839  BP: (!) 101/53  (!) 91/57 108/69  Pulse: 64  60 67  Resp: 19 20 16 18   Temp: 97.9 F (36.6 C) 98 F (36.7 C) 97.8 F (36.6 C) 97.6 F (36.4 C)  TempSrc: Oral Oral Oral Oral  SpO2: 100%  100% 99%  Weight:   60 kg    No intake or output data in the 24 hours ending 08/08/20 0945 Last 3 Weights 08/08/2020 08/07/2020 08/06/2020  Weight (lbs) 132 lb 4.4 oz 132  lb 0.9 oz 124 lb 1.9 oz  Weight (kg) 60 kg 59.9 kg 56.3 kg      Telemetry  Overnight telemetry shows NSR 70s, which I personally reviewed.   Physical Exam   Vitals:   08/07/20 2006 08/07/20 2336 08/08/20 0418 08/08/20 0839  BP: (!) 101/53  (!) 91/57 108/69  Pulse: 64  60 67  Resp: 19 20 16 18   Temp: 97.9 F (36.6 C) 98 F (36.7 C) 97.8 F (36.6 C) 97.6 F (36.4 C)  TempSrc: Oral Oral Oral Oral  SpO2: 100%  100% 99%  Weight:   60 kg    No intake or output data in the 24 hours ending 08/08/20 0945  Last 3 Weights 08/08/2020 08/07/2020 08/06/2020  Weight (lbs) 132 lb 4.4 oz 132 lb 0.9 oz 124 lb 1.9 oz  Weight (kg) 60 kg 59.9 kg 56.3 kg    Body mass index is 22.01 kg/m.   General: Well nourished, well developed, in no acute distress Head: Atraumatic, normal size  Eyes: PEERLA, EOMI  Neck: Supple, no JVD Endocrine: No thryomegaly Cardiac: Normal S1, S2; RRR; 3/6 SEM Lungs: Clear to auscultation bilaterally,  no wheezing, rhonchi or rales  Abd: Soft, nontender, no hepatomegaly  Ext: No edema, pulses 2+, R AVF fistula  Musculoskeletal: No deformities, BUE and BLE strength normal and equal Skin: Warm and dry, no rashes   Neuro: Alert and oriented to person, place, time, and situation, CNII-XII grossly intact, no focal deficits  Psych: Normal mood and affect   Labs  High Sensitivity Troponin:   Recent Labs  Lab 08/01/20 1052 08/01/20 1302 08/03/20 1450 08/05/20 0918  TROPONINIHS 26* 30* 20* 28*     Cardiac EnzymesNo results for input(s): TROPONINI in the last 168 hours. No results for input(s): TROPIPOC in the last 168 hours.  Chemistry Recent Labs  Lab 08/01/20 1052 08/01/20 1115 08/06/20 0802 08/07/20 0335 08/08/20 0629  NA 140   < > 137 136 136  K 3.2*   < > 4.3 4.2 4.5  CL 98   < > 99 100 101  CO2 29   < > 27 29 28   GLUCOSE 106*   < > 81 93 73  BUN 22   < > 18 12 20   CREATININE 3.63*   < > 2.97* 2.28* 3.19*  CALCIUM 8.3*   < > 8.2* 8.3* 8.3*  PROT 6.9  --    --   --   --   ALBUMIN 2.7*  --  2.4*  --   --   AST 24  --   --   --   --   ALT 13  --   --   --   --   ALKPHOS 81  --   --   --   --   BILITOT 0.8  --   --   --   --   GFRNONAA 11*   < > 14* 19* 13*  GFRAA 13*   < > 16* 22* 15*  ANIONGAP 13   < > 11 7 7    < > = values in this interval not displayed.    Hematology Recent Labs  Lab 08/06/20 0802 08/07/20 0335 08/08/20 0629  WBC 6.4 6.6 7.1  RBC 2.99* 3.08* 3.07*  HGB 9.6* 10.0* 9.8*  HCT 32.0* 33.1* 33.0*  MCV 107.0* 107.5* 107.5*  MCH 32.1 32.5 31.9  MCHC 30.0 30.2 29.7*  RDW 15.0 15.2 14.9  PLT 184 174 209   BNP Recent Labs  Lab 08/01/20 1413  BNP 1,092.0*    DDimer No results for input(s): DDIMER in the last 168 hours.   Radiology  ECHOCARDIOGRAM COMPLETE  Result Date: 08/07/2020    ECHOCARDIOGRAM REPORT   Patient Name:   [name] James [place] Date of Exam: 08/07/2020 Medical Rec #:  [id number]       Height:       65.0 in Accession #:    [id number]      Weight:       132.1 lb Date of Birth:  06-04-1936       BSA:          1.658 m Patient Age:    84 years        BP:           74/58 mmHg Patient Gender: F               HR:           71 bpm. Exam Location:  Inpatient Procedure: 2D Echo, Cardiac Doppler, Color Doppler and Intracardiac            Opacification Agent Indications:  Murmur 785.2 / R01.1  History:        Patient has prior history of Echocardiogram examinations, most                 recent 07/06/2018. CHF, Arrythmias:Atrial Fibrillation,                 Signs/Symptoms:Chest Pain and Hypotension; Risk                 Factors:Hypertension, Diabetes and Non-Smoker.  Sonographer:    Vickie Epley RDCS Referring Phys: [id number] [place]  1. Left ventricular ejection fraction, by estimation, is 70 to 75%. The left ventricle has hyperdynamic function. The left ventricle has no regional wall motion abnormalities. There is moderate left ventricular hypertrophy. Left ventricular diastolic parameters are consistent  with Grade II diastolic dysfunction (pseudonormalization). Elevated left atrial pressure.  2. Right ventricular systolic function is normal. The right ventricular size is normal. There is moderately elevated pulmonary artery systolic pressure. The estimated right ventricular systolic pressure is 20.9 mmHg.  3. The mitral valve is abnormal. Severe mitral annular calcification. Trivial mitral valve regurgitation. Mild to moderate mitral stenosis. MG 87mmHg at HR 70 bpm. MVA 1.2 cm^2 by continuity equation  4. The aortic valve is tricuspid. Aortic valve regurgitation is not visualized. Mild to moderate aortic valve sclerosis/calcification is present, without any evidence of aortic stenosis.  5. The inferior vena cava is normal in size with <50% respiratory variability, suggesting right atrial pressure of 8 mmHg. FINDINGS  Left Ventricle: Left ventricular ejection fraction, by estimation, is 70 to 75%. The left ventricle has hyperdynamic function. The left ventricle has no regional wall motion abnormalities. Definity contrast agent was given IV to delineate the left ventricular endocardial borders. The left ventricular internal cavity size was small. There is moderate left ventricular hypertrophy. Left ventricular diastolic parameters are consistent with Grade II diastolic dysfunction (pseudonormalization). Elevated  left atrial pressure. Right Ventricle: The right ventricular size is normal. No increase in right ventricular wall thickness. Right ventricular systolic function is normal. There is moderately elevated pulmonary artery systolic pressure. The tricuspid regurgitant velocity is 3.46 m/s, and with an assumed right atrial pressure of 8 mmHg, the estimated right ventricular systolic pressure is 47.0 mmHg. Left Atrium: Left atrial size was normal in size. Right Atrium: Right atrial size was normal in size. Pericardium: There is no evidence of pericardial effusion. Mitral Valve: The mitral valve is abnormal. Severe  mitral annular calcification. Trivial mitral valve regurgitation. Mild to moderate mitral valve stenosis. Tricuspid Valve: The tricuspid valve is normal in structure. Tricuspid valve regurgitation is trivial. Aortic Valve: The aortic valve is tricuspid. Aortic valve regurgitation is not visualized. Mild to moderate aortic valve sclerosis/calcification is present, without any evidence of aortic stenosis. Pulmonic Valve: The pulmonic valve was not well visualized. Pulmonic valve regurgitation is not visualized. Aorta: The aortic root is normal in size and structure. Venous: The inferior vena cava is normal in size with less than 50% respiratory variability, suggesting right atrial pressure of 8 mmHg. IAS/Shunts: The interatrial septum was not well visualized.  LEFT VENTRICLE PLAX 2D LVOT diam:     1.70 cm     Diastology LV SV:         57          LV e' lateral:   3.49 cm/s LV SV Index:   35          LV E/e' lateral: 33.0 LVOT Area:  2.27 cm    LV e' medial:    3.24 cm/s                            LV E/e' medial:  35.5  LV Volumes (MOD) LV vol d, MOD A4C: 85.0 ml LV vol s, MOD A4C: 28.1 ml LV SV MOD A4C:     85.0 ml RIGHT VENTRICLE RV S prime:     16.20 cm/s TAPSE (M-mode): 2.3 cm LEFT ATRIUM           Index       RIGHT ATRIUM          Index LA Vol (A2C): 29.6 ml 17.85 ml/m RA Area:     9.82 cm LA Vol (A4C): 54.4 ml 32.81 ml/m RA Volume:   20.90 ml 12.60 ml/m  AORTIC VALVE LVOT Vmax:   123.00 cm/s LVOT Vmean:  84.100 cm/s LVOT VTI:    0.253 m  AORTA Ao Root diam: 2.60 cm MITRAL VALVE                TRICUSPID VALVE MV Area (PHT): 2.26 cm     TR Peak grad:   47.9 mmHg MV Decel Time: 335 msec     TR Vmax:        346.00 cm/s MV E velocity: 115.00 cm/s MV A velocity: 142.00 cm/s  SHUNTS MV E/A ratio:  0.81         Systemic VTI:  0.25 m                             Systemic Diam: 1.70 cm Oswaldo Milian MD Electronically signed by Oswaldo Milian MD Signature Date/Time: 08/07/2020/3:41:25 PM    Final      Cardiac Studies  TTE 08/07/2020  1. Left ventricular ejection fraction, by estimation, is 70 to 75%. The  left ventricle has hyperdynamic function. The left ventricle has no  regional wall motion abnormalities. There is moderate left ventricular  hypertrophy. Left ventricular diastolic  parameters are consistent with Grade II diastolic dysfunction  (pseudonormalization). Elevated left atrial pressure.  2. Right ventricular systolic function is normal. The right ventricular  size is normal. There is moderately elevated pulmonary artery systolic  pressure. The estimated right ventricular systolic pressure is 16.1 mmHg.  3. The mitral valve is abnormal. Severe mitral annular calcification.  Trivial mitral valve regurgitation. Mild to moderate mitral stenosis. MG  23mmHg at HR 70 bpm. MVA 1.2 cm^2 by continuity equation  4. The aortic valve is tricuspid. Aortic valve regurgitation is not  visualized. Mild to moderate aortic valve sclerosis/calcification is  present, without any evidence of aortic stenosis.  5. The inferior vena cava is normal in size with <50% respiratory  variability, suggesting right atrial pressure of 8 mmHg.   Patient Profile  [name] James is a 84 y.o. female with ESRD, pAF (no AC due to GIB), tachy brady (no ppm candidate), HFpEF, CAD admitted 08/03/2020 with CP and AFL during HD.   Assessment & Plan   1. AFL with HD, tachy-brady -no transvenous access for traditional ppm. -cannot tolerate HD due to AFL with RVR -tachy-brady precludes aggressive BB -on amio and started on digoxin to see if she can tolerate HD -if cannot, will need leadless ppm. EP following.  2. Mild AS/Moderate to severe MS -has at least mild AS, likely more -mild to moderate MS (calcific and  MVA 1.2 cm2 by VTI) -she will not tolerate high heart rates  -CTM  For questions or updates, please contact [place] Please consult [email or web address] for contact info under   Time Spent  with Patient: I have spent a total of 25 minutes with patient reviewing hospital notes, telemetry, EKGs, labs and examining the patient as well as establishing an assessment and plan that was discussed with the patient.  > 50% of time was spent in direct patient care.    Signed, Addison Naegeli. Audie Box, [place]  08/08/2020 9:45 AM

## 2020-08-08 NOTE — Progress Notes (Signed)
PROGRESS NOTE    [name] James  TIR:[id number] DOB: 1936-04-09 DOA: 08/03/2020 PCP: Patrick Jupiter   Brief Narrative:  HPI on 08/03/2020 by Dr. Gala Romney [name] James is a 84 y.o. female with medical history significant of end-stage renal disease on hemodialysis Mondays Wednesdays and Fridays, GERD, atrial fibrillation, hyperlipidemia, hypertension, diabetes who was at hemodialysis today when she started having chest discomfort and noted to have A. fib with RVR.  She was sent to the ER before completing her hemodialysis.  Patient was seen and evaluated.  She was seen here last night with exactly the same symptoms.  At that point cardiology was consulted.  Patient was placed on amiodarone and Lopressor was discontinued with.  When she came to the ER today she was found to have rates also in the 130s 140s.  She was given a dose of Lopressor which controlled her rate now but maintained still on her amiodarone.  Patient noted to have profound hypotension.  Systolics remaining in the 80s.  She is also weak and debilitated.  She is therefore being admitted for observation to manage her hypotension.  Nephrology consulted.  Interim history Patient with atrial fibrillation/flutter with RVR.  Cardiology consulted.  This keeps occurring during dialysis along with hypotension.  Cardiology currently titrating medications.  EP also consulted. Assessment & Plan   Paroxysmal atrial fibrillation/flutter with RVR -Patient presented with heart rates of 130s to 140s which required Lopressor in the emergency department -EP has been consulted and patient was started on amiodarone which is now been increased to 400 mg twice daily -She continues to have tachycardia during dialysis along with hypotension -Patient has been started on digoxin today -EP would like to monitor how patient does with dialysis today on digoxin.  She may need leadless pacing.  She would not be a candidate for any approach of transvenous  pacing due to LUE occlusions and LUE AV fistula.   Hypotension -BP has noted to be on the lower side -She is currently on midodrine -Continue to monitor  End-stage renal disease on hemodialysis -Patient dialyzes on Monday, Wednesday, Friday -Nephrology consulted and appreciated -Patient is due for dialysis today  Anemia of chronic disease -Stable, continue to monitor CBC  Chronic macrocytosis -Questionable cause.  Vitamin B12 and folate are within normal limits  Chronic diastolic heart failure -Does not appear to be volume overload at this time -Continue volume control with dialysis -Monitor intake and output and daily weights  Diabetes mellitus, type II -Continue insulin sliding scale and CBG monitoring  Stage II sacral ulcer -Present on -Continue wound care  Chronic hypoxic respiratory failure -Patient uses 2 L at baseline -Stable  Generalized deconditioning/goals of care -Patient and daughter are not interested in palliative care discussions -Per previous hospitalist daughter states her mother is hard of hearing and does not understand the concept of these discussions although patient has expressed to multiple providers that she would like to pass away in peace.  Daughter does not want any more discussions regarding this  DVT Prophylaxis  SCDs  Code Status: Full  Family Communication: None at bedside  Disposition Plan:  Status is: Inpatient  Remains inpatient appropriate because:Hemodynamically unstable   Dispo: The patient is from: SNF              Anticipated d/c is to: SNF              Anticipated d/c date is: 2 days  Patient currently is not medically stable to d/c.   Consultants Cardiology/ EP Nephrology  Palliative care  Procedures  None  Antibiotics   Anti-infectives (From admission, onward)   None      Subjective:   [name] James seen and examined today.  Patient tells me that she feels constipated this morning would like  to have a suppository but only after dialysis.  Also complains of nausea and feeling shortness of breath.  Denies current chest pain.  Denies abdominal pain, dizziness or headache at this time.  Objective:   Vitals:   08/08/20 0418 08/08/20 0839 08/08/20 1012 08/08/20 1141  BP: (!) 91/57 108/69    Pulse: 60 67 72   Resp: 16 18    Temp: 97.8 F (36.6 C) 97.6 F (36.4 C)  98.4 F (36.9 C)  TempSrc: Oral Oral  Oral  SpO2: 100% 99%    Weight: 60 kg      No intake or output data in the 24 hours ending 08/08/20 1156 Filed Weights   08/06/20 1000 08/07/20 0333 08/08/20 0418  Weight: 56.3 kg 59.9 kg 60 kg    Exam  General: Well developed, chronically ill-appearing, NAD  HEENT: NCAT, mucous membranes moist.   Cardiovascular: S1 S2 auscultated, RRR  Respiratory: Diminished breath sounds  Abdomen: Soft, nontender, nondistended, + bowel sounds  Extremities: warm dry without cyanosis clubbing. LE edema  Neuro: AAOx3, hard of hearing, poor historian, otherwise nonfocal  Psych: appropriate mood and affect, pleasant    Data Reviewed: I have personally reviewed following labs and imaging studies  CBC: Recent Labs  Lab 08/04/20 0342 08/05/20 0918 08/06/20 0802 08/07/20 0335 08/08/20 0629  WBC 5.4 7.3 6.4 6.6 7.1  NEUTROABS  --  4.9  --  3.3 4.1  HGB 9.3* 10.1* 9.6* 10.0* 9.8*  HCT 30.8* 33.1* 32.0* 33.1* 33.0*  MCV 107.3* 105.4* 107.0* 107.5* 107.5*  PLT 168 172 184 174 102   Basic Metabolic Panel: Recent Labs  Lab 08/01/20 1302 08/01/20 1639 08/03/20 1450 08/03/20 1450 08/04/20 0342 08/05/20 0918 08/05/20 1428 08/06/20 0802 08/07/20 0335 08/08/20 0629  NA  --   --  138   < > 139 138  --  137 136 136  K  --   --  4.5   < > 4.0 4.2  --  4.3 4.2 4.5  CL  --   --  100   < > 101 101  --  99 100 101  CO2  --   --  25   < > 27 28  --  27 29 28   GLUCOSE  --   --  105*   < > 128* 166*  --  81 93 73  BUN  --   --  21   < > 26* 11  --  18 12 20   CREATININE  --   --   3.32*   < > 3.81* 2.33*  --  2.97* 2.28* 3.19*  CALCIUM  --   --  7.5*   < > 7.4* 7.7*  --  8.2* 8.3* 8.3*  MG 1.9 2.0 1.9  --   --   --   --   --   --   --   PHOS  --   --   --   --   --   --  3.3 4.0  --   --    < > = values in this interval not displayed.   GFR: Estimated Creatinine Clearance:  12.2 mL/min (A) (by C-G formula based on SCr of 3.19 mg/dL (H)). Liver Function Tests: Recent Labs  Lab 08/06/20 0802  ALBUMIN 2.4*   No results for input(s): LIPASE, AMYLASE in the last 168 hours. No results for input(s): AMMONIA in the last 168 hours. Coagulation Profile: No results for input(s): INR, PROTIME in the last 168 hours. Cardiac Enzymes: No results for input(s): CKTOTAL, CKMB, CKMBINDEX, TROPONINI in the last 168 hours. BNP (last 3 results) No results for input(s): PROBNP in the last 8760 hours. HbA1C: No results for input(s): HGBA1C in the last 72 hours. CBG: Recent Labs  Lab 08/08/20 1137  GLUCAP 131*   Lipid Profile: No results for input(s): CHOL, HDL, LDLCALC, TRIG, CHOLHDL, LDLDIRECT in the last 72 hours. Thyroid Function Tests: No results for input(s): TSH, T4TOTAL, FREET4, T3FREE, THYROIDAB in the last 72 hours. Anemia Panel: Recent Labs    08/05/20 1428  VITAMINB12 964*  FOLATE 67.5   Urine analysis:    Component Value Date/Time   COLORURINE AMBER (A) 03/14/2019 0435   APPEARANCEUR TURBID (A) 03/14/2019 0435   LABSPEC 1.014 03/14/2019 [place] 7.0 03/14/2019 0435   GLUCOSEU NEGATIVE 03/14/2019 0435   HGBUR SMALL (A) 03/14/2019 0435   BILIRUBINUR NEGATIVE 03/14/2019 0435   BILIRUBINUR neg 04/02/2014 0848   KETONESUR NEGATIVE 03/14/2019 0435   PROTEINUR 100 (A) 03/14/2019 0435   UROBILINOGEN 0.2 10/26/2014 2043   NITRITE POSITIVE (A) 03/14/2019 0435   LEUKOCYTESUR LARGE (A) 03/14/2019 0435   Sepsis Labs: @LABRCNTIP (procalcitonin:4,lacticidven:4)  ) Recent Results (from the past 240 hour(s))  SARS Coronavirus 2 by RT PCR (hospital order,  performed in [facility] hospital lab) Nasopharyngeal Nasopharyngeal Swab     Status: None   Collection Time: 08/03/20  8:10 PM   Specimen: Nasopharyngeal Swab  Result Value Ref Range Status   SARS Coronavirus 2 NEGATIVE NEGATIVE Final    Comment: (NOTE) SARS-CoV-2 target nucleic acids are NOT DETECTED.  The SARS-CoV-2 RNA is generally detectable in upper and lower respiratory specimens during the acute phase of infection. The lowest concentration of SARS-CoV-2 viral copies this assay can detect is 250 copies / mL. A negative result does not preclude SARS-CoV-2 infection and should not be used as the sole basis for treatment or other patient management decisions.  A negative result may occur with improper specimen collection / handling, submission of specimen other than nasopharyngeal swab, presence of viral mutation(s) within the areas targeted by this assay, and inadequate number of viral copies (<250 copies / mL). A negative result must be combined with clinical observations, patient history, and epidemiological information.  Fact Sheet for Patients:   StrictlyIdeas.no  Fact Sheet for Healthcare Providers: BankingDealers.co.za  This test is not yet approved or  cleared by the [place] FDA and has been authorized for detection and/or diagnosis of SARS-CoV-2 by FDA under an Emergency Use Authorization (EUA).  This EUA will remain in effect (meaning this test can be used) for the duration of the COVID-19 declaration under Section 564(b)(1) of the Act, 21 U.S.C. section 360bbb-3(b)(1), unless the authorization is terminated or revoked sooner.  Performed at [facility], [place] [address] [place], [place] [address]       Radiology Studies: ECHOCARDIOGRAM COMPLETE  Result Date: 08/07/2020    ECHOCARDIOGRAM REPORT   Patient Name:   [name] James Date of Exam: 08/07/2020 Medical Rec #:  [id number]       Height:       65.0 in  Accession #:    [id number]      Weight:       132.1 lb Date of Birth:  1936-05-01       BSA:          1.658 m Patient Age:    57 years        BP:           74/58 mmHg Patient Gender: F               HR:           71 bpm. Exam Location:  Inpatient Procedure: 2D Echo, Cardiac Doppler, Color Doppler and Intracardiac            Opacification Agent Indications:    Murmur 785.2 / R01.1  History:        Patient has prior history of Echocardiogram examinations, most                 recent 07/06/2018. CHF, Arrythmias:Atrial Fibrillation,                 Signs/Symptoms:Chest Pain and Hypotension; Risk                 Factors:Hypertension, Diabetes and Non-Smoker.  Sonographer:    Vickie Epley RDCS Referring Phys: [id number] [place]  1. Left ventricular ejection fraction, by estimation, is 70 to 75%. The left ventricle has hyperdynamic function. The left ventricle has no regional wall motion abnormalities. There is moderate left ventricular hypertrophy. Left ventricular diastolic parameters are consistent with Grade II diastolic dysfunction (pseudonormalization). Elevated left atrial pressure.  2. Right ventricular systolic function is normal. The right ventricular size is normal. There is moderately elevated pulmonary artery systolic pressure. The estimated right ventricular systolic pressure is 18.2 mmHg.  3. The mitral valve is abnormal. Severe mitral annular calcification. Trivial mitral valve regurgitation. Mild to moderate mitral stenosis. MG 63mmHg at HR 70 bpm. MVA 1.2 cm^2 by continuity equation  4. The aortic valve is tricuspid. Aortic valve regurgitation is not visualized. Mild to moderate aortic valve sclerosis/calcification is present, without any evidence of aortic stenosis.  5. The inferior vena cava is normal in size with <50% respiratory variability, suggesting right atrial pressure of 8 mmHg. FINDINGS  Left Ventricle: Left ventricular ejection fraction, by estimation, is 70 to 75%. The left  ventricle has hyperdynamic function. The left ventricle has no regional wall motion abnormalities. Definity contrast agent was given IV to delineate the left ventricular endocardial borders. The left ventricular internal cavity size was small. There is moderate left ventricular hypertrophy. Left ventricular diastolic parameters are consistent with Grade II diastolic dysfunction (pseudonormalization). Elevated  left atrial pressure. Right Ventricle: The right ventricular size is normal. No increase in right ventricular wall thickness. Right ventricular systolic function is normal. There is moderately elevated pulmonary artery systolic pressure. The tricuspid regurgitant velocity is 3.46 m/s, and with an assumed right atrial pressure of 8 mmHg, the estimated right ventricular systolic pressure is 99.3 mmHg. Left Atrium: Left atrial size was normal in size. Right Atrium: Right atrial size was normal in size. Pericardium: There is no evidence of pericardial effusion. Mitral Valve: The mitral valve is abnormal. Severe mitral annular calcification. Trivial mitral valve regurgitation. Mild to moderate mitral valve stenosis. Tricuspid Valve: The tricuspid valve is normal in structure. Tricuspid valve regurgitation is trivial. Aortic Valve: The aortic valve is tricuspid. Aortic valve regurgitation is not visualized. Mild to moderate aortic valve sclerosis/calcification is present, without  any evidence of aortic stenosis. Pulmonic Valve: The pulmonic valve was not well visualized. Pulmonic valve regurgitation is not visualized. Aorta: The aortic root is normal in size and structure. Venous: The inferior vena cava is normal in size with less than 50% respiratory variability, suggesting right atrial pressure of 8 mmHg. IAS/Shunts: The interatrial septum was not well visualized.  LEFT VENTRICLE PLAX 2D LVOT diam:     1.70 cm     Diastology LV SV:         57          LV e' lateral:   3.49 cm/s LV SV Index:   35          LV E/e'  lateral: 33.0 LVOT Area:     2.27 cm    LV e' medial:    3.24 cm/s                            LV E/e' medial:  35.5  LV Volumes (MOD) LV vol d, MOD A4C: 85.0 ml LV vol s, MOD A4C: 28.1 ml LV SV MOD A4C:     85.0 ml RIGHT VENTRICLE RV S prime:     16.20 cm/s TAPSE (M-mode): 2.3 cm LEFT ATRIUM           Index       RIGHT ATRIUM          Index LA Vol (A2C): 29.6 ml 17.85 ml/m RA Area:     9.82 cm LA Vol (A4C): 54.4 ml 32.81 ml/m RA Volume:   20.90 ml 12.60 ml/m  AORTIC VALVE LVOT Vmax:   123.00 cm/s LVOT Vmean:  84.100 cm/s LVOT VTI:    0.253 m  AORTA Ao Root diam: 2.60 cm MITRAL VALVE                TRICUSPID VALVE MV Area (PHT): 2.26 cm     TR Peak grad:   47.9 mmHg MV Decel Time: 335 msec     TR Vmax:        346.00 cm/s MV E velocity: 115.00 cm/s MV A velocity: 142.00 cm/s  SHUNTS MV E/A ratio:  0.81         Systemic VTI:  0.25 m                             Systemic Diam: 1.70 cm Oswaldo Milian MD Electronically signed by Oswaldo Milian MD Signature Date/Time: 08/07/2020/3:41:25 PM    Final      Scheduled Meds: . (feeding supplement) PROSource Plus  30 mL Oral BID BM  . allopurinol  100 mg Oral Daily  . amiodarone  400 mg Oral BID  . aspirin  81 mg Oral Daily  . azelastine  2 spray Each Nare BID  . bisacodyl  10 mg Rectal Once  . brinzolamide  1 drop Both Eyes BID   Or  . brimonidine  1 drop Both Eyes BID  . calcium carbonate  1 tablet Oral BID  . Chlorhexidine Gluconate Cloth  6 each Topical Q0600  . darbepoetin (ARANESP) injection - DIALYSIS  40 mcg Intravenous Q Mon-HD  . diazepam  5 mg Oral QHS  . diclofenac Sodium  2 g Topical QHS  . digoxin  0.0625 mg Oral Q M,W,F  . dimethicone  1 application Topical BID  . diphenoxylate-atropine  1 tablet Oral Q M,W,F-HD  . fluticasone  2 spray Each Nare Daily  . ipratropium  2 spray Each Nare Q12H  . loperamide  4 mg Oral Q M,W,F-HD  . loratadine  10 mg Oral Daily  . melatonin  10 mg Oral QHS  . midodrine  10 mg Oral Q M,W,F-HD    . multivitamin  1 tablet Oral QHS  . traZODone  50 mg Oral QHS   Continuous Infusions:   LOS: 4 days   Time Spent in minutes   45 minutes  Juanmiguel Defelice D.O. on 08/08/2020 at 11:56 AM  Between 7am to 7pm - Please see pager noted on [email or web address]  After 7pm go to [email or web address]  And look for the night coverage person covering for me after hours  [facility] Office  [phone]

## 2020-08-08 NOTE — Care Management Important Message (Signed)
Important Message  Patient Details  Name: [name] James MRN: [id number] Date of Birth: 05-21-1936   Medicare Important Message Given:  Yes     Shelda Altes 08/08/2020, 12:20 PM

## 2020-08-08 NOTE — Progress Notes (Signed)
[facility] Progress Note   Subjective:  Seen in room. Slight dyspnea last night and woke with palpitations but she reports not as bad as usual. Now on digoxin. For HD later today.  Objective Vitals:   08/07/20 2006 08/07/20 2336 08/08/20 0418 08/08/20 0839  BP: (!) 101/53  (!) 91/57 108/69  Pulse: 64  60 67  Resp: 19 20 16 18   Temp: 97.9 F (36.6 C) 98 F (36.7 C) 97.8 F (36.6 C) 97.6 F (36.4 C)  TempSrc: Oral Oral Oral Oral  SpO2: 100%  100% 99%  Weight:   60 kg    Physical Exam General: Frail woman, NAD. On nasal O2 Heart: RRR; no murmur Lungs: CTA anteriorly, no rales noted Abdomen: soft, non-tender Extremities: trace BLE edema Dialysis Access: R AVG + bruit  Additional Objective Labs: Basic Metabolic Panel: Recent Labs  Lab 08/05/20 0918 08/05/20 1428 08/06/20 0802 08/07/20 0335 08/08/20 0629  NA   < >  --  137 136 136  K   < >  --  4.3 4.2 4.5  CL   < >  --  99 100 101  CO2   < >  --  27 29 28   GLUCOSE   < >  --  81 93 73  BUN   < >  --  18 12 20   CREATININE   < >  --  2.97* 2.28* 3.19*  CALCIUM   < >  --  8.2* 8.3* 8.3*  PHOS  --  3.3 4.0  --   --    < > = values in this interval not displayed.   Liver Function Tests: Recent Labs  Lab 08/01/20 1052 08/06/20 0802  AST 24  --   ALT 13  --   ALKPHOS 81  --   BILITOT 0.8  --   PROT 6.9  --   ALBUMIN 2.7* 2.4*   CBC: Recent Labs  Lab 08/04/20 0342 08/04/20 0342 08/05/20 0918 08/05/20 0918 08/06/20 0802 08/07/20 0335 08/08/20 0629  WBC 5.4   < > 7.3   < > 6.4 6.6 7.1  NEUTROABS  --   --  4.9  --   --  3.3 4.1  HGB 9.3*   < > 10.1*   < > 9.6* 10.0* 9.8*  HCT 30.8*   < > 33.1*   < > 32.0* 33.1* 33.0*  MCV 107.3*  --  105.4*  --  107.0* 107.5* 107.5*  PLT 168   < > 172   < > 184 174 209   < > = values in this interval not displayed.   Studies/Results: ECHOCARDIOGRAM COMPLETE  Result Date: 08/07/2020    ECHOCARDIOGRAM REPORT   Patient Name:   CHAUNDRA ABREU Kraner Date of  Exam: 08/07/2020 Medical Rec #:  [id number]       Height:       65.0 in Accession #:    [id number]      Weight:       132.1 lb Date of Birth:  November 02, 1936       BSA:          1.658 m Patient Age:    33 years        BP:           74/58 mmHg Patient Gender: F               HR:           71 bpm. Exam Location:  Inpatient Procedure: 2D Echo, Cardiac Doppler, Color Doppler and Intracardiac            Opacification Agent Indications:    Murmur 785.2 / R01.1  History:        Patient has prior history of Echocardiogram examinations, most                 recent 07/06/2018. CHF, Arrythmias:Atrial Fibrillation,                 Signs/Symptoms:Chest Pain and Hypotension; Risk                 Factors:Hypertension, Diabetes and Non-Smoker.  Sonographer:    Vickie Epley RDCS Referring Phys: [id number] [place]  1. Left ventricular ejection fraction, by estimation, is 70 to 75%. The left ventricle has hyperdynamic function. The left ventricle has no regional wall motion abnormalities. There is moderate left ventricular hypertrophy. Left ventricular diastolic parameters are consistent with Grade II diastolic dysfunction (pseudonormalization). Elevated left atrial pressure.  2. Right ventricular systolic function is normal. The right ventricular size is normal. There is moderately elevated pulmonary artery systolic pressure. The estimated right ventricular systolic pressure is 06.3 mmHg.  3. The mitral valve is abnormal. Severe mitral annular calcification. Trivial mitral valve regurgitation. Mild to moderate mitral stenosis. MG 25mmHg at HR 70 bpm. MVA 1.2 cm^2 by continuity equation  4. The aortic valve is tricuspid. Aortic valve regurgitation is not visualized. Mild to moderate aortic valve sclerosis/calcification is present, without any evidence of aortic stenosis.  5. The inferior vena cava is normal in size with <50% respiratory variability, suggesting right atrial pressure of 8 mmHg. FINDINGS  Left Ventricle:  Left ventricular ejection fraction, by estimation, is 70 to 75%. The left ventricle has hyperdynamic function. The left ventricle has no regional wall motion abnormalities. Definity contrast agent was given IV to delineate the left ventricular endocardial borders. The left ventricular internal cavity size was small. There is moderate left ventricular hypertrophy. Left ventricular diastolic parameters are consistent with Grade II diastolic dysfunction (pseudonormalization). Elevated  left atrial pressure. Right Ventricle: The right ventricular size is normal. No increase in right ventricular wall thickness. Right ventricular systolic function is normal. There is moderately elevated pulmonary artery systolic pressure. The tricuspid regurgitant velocity is 3.46 m/s, and with an assumed right atrial pressure of 8 mmHg, the estimated right ventricular systolic pressure is 01.6 mmHg. Left Atrium: Left atrial size was normal in size. Right Atrium: Right atrial size was normal in size. Pericardium: There is no evidence of pericardial effusion. Mitral Valve: The mitral valve is abnormal. Severe mitral annular calcification. Trivial mitral valve regurgitation. Mild to moderate mitral valve stenosis. Tricuspid Valve: The tricuspid valve is normal in structure. Tricuspid valve regurgitation is trivial. Aortic Valve: The aortic valve is tricuspid. Aortic valve regurgitation is not visualized. Mild to moderate aortic valve sclerosis/calcification is present, without any evidence of aortic stenosis. Pulmonic Valve: The pulmonic valve was not well visualized. Pulmonic valve regurgitation is not visualized. Aorta: The aortic root is normal in size and structure. Venous: The inferior vena cava is normal in size with less than 50% respiratory variability, suggesting right atrial pressure of 8 mmHg. IAS/Shunts: The interatrial septum was not well visualized.  LEFT VENTRICLE PLAX 2D LVOT diam:     1.70 cm     Diastology LV SV:          57          LV e' lateral:  3.49 cm/s LV SV Index:   35          LV E/e' lateral: 33.0 LVOT Area:     2.27 cm    LV e' medial:    3.24 cm/s                            LV E/e' medial:  35.5  LV Volumes (MOD) LV vol d, MOD A4C: 85.0 ml LV vol s, MOD A4C: 28.1 ml LV SV MOD A4C:     85.0 ml RIGHT VENTRICLE RV S prime:     16.20 cm/s TAPSE (M-mode): 2.3 cm LEFT ATRIUM           Index       RIGHT ATRIUM          Index LA Vol (A2C): 29.6 ml 17.85 ml/m RA Area:     9.82 cm LA Vol (A4C): 54.4 ml 32.81 ml/m RA Volume:   20.90 ml 12.60 ml/m  AORTIC VALVE LVOT Vmax:   123.00 cm/s LVOT Vmean:  84.100 cm/s LVOT VTI:    0.253 m  AORTA Ao Root diam: 2.60 cm MITRAL VALVE                TRICUSPID VALVE MV Area (PHT): 2.26 cm     TR Peak grad:   47.9 mmHg MV Decel Time: 335 msec     TR Vmax:        346.00 cm/s MV E velocity: 115.00 cm/s MV A velocity: 142.00 cm/s  SHUNTS MV E/A ratio:  0.81         Systemic VTI:  0.25 m                             Systemic Diam: 1.70 cm Oswaldo Milian MD Electronically signed by Oswaldo Milian MD Signature Date/Time: 08/07/2020/3:41:25 PM    Final    Medications:  . (feeding supplement) PROSource Plus  30 mL Oral BID BM  . allopurinol  100 mg Oral Daily  . amiodarone  400 mg Oral BID  . aspirin  81 mg Oral Daily  . azelastine  2 spray Each Nare BID  . bisacodyl  10 mg Rectal Once  . brinzolamide  1 drop Both Eyes BID   Or  . brimonidine  1 drop Both Eyes BID  . calcium carbonate  1 tablet Oral BID  . Chlorhexidine Gluconate Cloth  6 each Topical Q0600  . darbepoetin (ARANESP) injection - DIALYSIS  40 mcg Intravenous Q Mon-HD  . diazepam  5 mg Oral QHS  . diclofenac Sodium  2 g Topical QHS  . digoxin  0.0625 mg Oral Q M,W,F  . dimethicone  1 application Topical BID  . diphenoxylate-atropine  1 tablet Oral Q M,W,F-HD  . fluticasone  2 spray Each Nare Daily  . ipratropium  2 spray Each Nare Q12H  . loperamide  4 mg Oral Q M,W,F-HD  . loratadine  10 mg Oral Daily   . melatonin  10 mg Oral QHS  . midodrine  10 mg Oral Q M,W,F-HD  . multivitamin  1 tablet Oral QHS  . traZODone  50 mg Oral QHS    [facility] Time: 3 hours 45 min, 3K, 2.25Ca, BFR 400, DFR 800, EDW 54.5kg, RUE AVG, heparin 3300 unit bolus Mircera 30 mcg IVP q 2 weeks- last dose 07/23/20 Hectorol 1 mcg  IV q HD  Assessment/Plan: 1. A fib with KHT:[id number] and hypotensive at HD on 08/03/20, 8/14, 8/16. On amiodarone, midodrine, and started on digoxin 8/17. Per primary/cardiology. Chronically low potassium, Keep K+ >4 2. ESRD:Continue HD on MWF schedule,using4K bath -> next HD 8/18. Hopefully will tolerate better. 3. Hypertension/volume:BP low with fluid overload as above, on amiodarone + dig now. Takes midodrine 10mg  before dialysis and another 10mg  mid treatment as needed. ?Consider TID dosing. 4. Anemia:Hgb 9.8. Continue Aranesp 30mcg q Monday. 5. Metabolic bone disease:CorrCa/Phos ok. Continue Tums BID. Not on other binder. 6. Nutrition:Albumin low, continue protein supplements.  [name], PA-C 08/08/2020, 9:41 AM  [employer]

## 2020-08-08 NOTE — Progress Notes (Signed)
Electrophysiology Rounding Note  Patient Name: [name] James Date of Encounter: 08/08/2020  Primary Cardiologist: Sanda Klein, MD Electrophysiologist: New   Subjective   Doing OK this am. Feels heart racing at times. Tolerated HD Monday poorly with AF/AFL  Inpatient Medications    Scheduled Meds: . (feeding supplement) PROSource Plus  30 mL Oral BID BM  . allopurinol  100 mg Oral Daily  . amiodarone  400 mg Oral BID  . aspirin  81 mg Oral Daily  . azelastine  2 spray Each Nare BID  . brinzolamide  1 drop Both Eyes BID   Or  . brimonidine  1 drop Both Eyes BID  . calcium carbonate  1 tablet Oral BID  . Chlorhexidine Gluconate Cloth  6 each Topical Q0600  . darbepoetin (ARANESP) injection - DIALYSIS  40 mcg Intravenous Q Mon-HD  . diazepam  5 mg Oral QHS  . diclofenac Sodium  2 g Topical QHS  . digoxin  0.0625 mg Oral Q M,W,F  . dimethicone  1 application Topical BID  . diphenoxylate-atropine  1 tablet Oral Q M,W,F-HD  . fluticasone  2 spray Each Nare Daily  . ipratropium  2 spray Each Nare Q12H  . loperamide  4 mg Oral Q M,W,F-HD  . loratadine  10 mg Oral Daily  . melatonin  10 mg Oral QHS  . midodrine  10 mg Oral Q M,W,F-HD  . multivitamin  1 tablet Oral QHS  . traZODone  50 mg Oral QHS   Continuous Infusions:  PRN Meds: acetaminophen, cholestyramine, guaiFENesin, metoprolol tartrate, midodrine, ondansetron (ZOFRAN) IV   Vital Signs    Vitals:   08/07/20 2006 08/07/20 2336 08/08/20 0418 08/08/20 0839  BP: (!) 101/53  (!) 91/57 108/69  Pulse: 64  60 67  Resp: 19 20 16 18   Temp: 97.9 F (36.6 C) 98 F (36.7 C) 97.8 F (36.6 C) 97.6 F (36.4 C)  TempSrc: Oral Oral Oral Oral  SpO2: 100%  100% 99%  Weight:   60 kg    No intake or output data in the 24 hours ending 08/08/20 0853 Filed Weights   08/06/20 1000 08/07/20 0333 08/08/20 0418  Weight: 56.3 kg 59.9 kg 60 kg    Physical Exam    GEN- The patient is elderly and frail appearing, alert and  oriented x 3 today.   Head- normocephalic, atraumatic Eyes-  Sclera clear, conjunctiva pink Ears- hearing intact Oropharynx- clear Neck- supple Lungs- Clear to ausculation bilaterally, normal work of breathing Heart- Regular rate and rhythm, no murmurs, rubs or gallops GI- soft, NT, ND, + BS Extremities- no clubbing or cyanosis. No edema Skin- no rash or lesion Psych- flat but appropriate affect Neuro- strength and sensation are intact  Labs    CBC Recent Labs    08/07/20 0335 08/08/20 0629  WBC 6.6 7.1  NEUTROABS 3.3 4.1  HGB 10.0* 9.8*  HCT 33.1* 33.0*  MCV 107.5* 107.5*  PLT 174 161   Basic Metabolic Panel Recent Labs    08/05/20 0918 08/05/20 1428 08/06/20 0802 08/06/20 0802 08/07/20 0335 08/08/20 0629  NA   < >  --  137   < > 136 136  K   < >  --  4.3   < > 4.2 4.5  CL   < >  --  99   < > 100 101  CO2   < >  --  27   < > 29 28  GLUCOSE   < >  --  81   < > 93 73  BUN   < >  --  18   < > 12 20  CREATININE   < >  --  2.97*   < > 2.28* 3.19*  CALCIUM   < >  --  8.2*   < > 8.3* 8.3*  PHOS  --  3.3 4.0  --   --   --    < > = values in this interval not displayed.   Liver Function Tests Recent Labs    08/06/20 0802  ALBUMIN 2.4*   No results for input(s): LIPASE, AMYLASE in the last 72 hours. Cardiac Enzymes No results for input(s): CKTOTAL, CKMB, CKMBINDEX, TROPONINI in the last 72 hours.   Telemetry    NSR 70s currently (personally reviewed)  Radiology    ECHOCARDIOGRAM COMPLETE  Result Date: 08/07/2020    ECHOCARDIOGRAM REPORT   Patient Name:   [name] James Date of Exam: 08/07/2020 Medical Rec #:  [id number]       Height:       65.0 in Accession #:    [id number]      Weight:       132.1 lb Date of Birth:  [birth year]       BSA:          1.658 m Patient Age:    84 years        BP:           74/58 mmHg Patient Gender: F               HR:           71 bpm. Exam Location:  Inpatient Procedure: 2D Echo, Cardiac Doppler, Color Doppler and Intracardiac             Opacification Agent Indications:    Murmur 785.2 / R01.1  History:        Patient has prior history of Echocardiogram examinations, most                 recent 07/06/2018. CHF, Arrythmias:Atrial Fibrillation,                 Signs/Symptoms:Chest Pain and Hypotension; Risk                 Factors:Hypertension, Diabetes and Non-Smoker.  Sonographer:    Vickie Epley RDCS Referring Phys: [id number] [place]  1. Left ventricular ejection fraction, by estimation, is 70 to 75%. The left ventricle has hyperdynamic function. The left ventricle has no regional wall motion abnormalities. There is moderate left ventricular hypertrophy. Left ventricular diastolic parameters are consistent with Grade II diastolic dysfunction (pseudonormalization). Elevated left atrial pressure.  2. Right ventricular systolic function is normal. The right ventricular size is normal. There is moderately elevated pulmonary artery systolic pressure. The estimated right ventricular systolic pressure is 36.6 mmHg.  3. The mitral valve is abnormal. Severe mitral annular calcification. Trivial mitral valve regurgitation. Mild to moderate mitral stenosis. MG 47mmHg at HR 70 bpm. MVA 1.2 cm^2 by continuity equation  4. The aortic valve is tricuspid. Aortic valve regurgitation is not visualized. Mild to moderate aortic valve sclerosis/calcification is present, without any evidence of aortic stenosis.  5. The inferior vena cava is normal in size with <50% respiratory variability, suggesting right atrial pressure of 8 mmHg. FINDINGS  Left Ventricle: Left ventricular ejection fraction, by estimation, is 70 to 75%. The left ventricle has hyperdynamic function. The left ventricle has no regional  wall motion abnormalities. Definity contrast agent was given IV to delineate the left ventricular endocardial borders. The left ventricular internal cavity size was small. There is moderate left ventricular hypertrophy. Left ventricular diastolic  parameters are consistent with Grade II diastolic dysfunction (pseudonormalization). Elevated  left atrial pressure. Right Ventricle: The right ventricular size is normal. No increase in right ventricular wall thickness. Right ventricular systolic function is normal. There is moderately elevated pulmonary artery systolic pressure. The tricuspid regurgitant velocity is 3.46 m/s, and with an assumed right atrial pressure of 8 mmHg, the estimated right ventricular systolic pressure is 99.2 mmHg. Left Atrium: Left atrial size was normal in size. Right Atrium: Right atrial size was normal in size. Pericardium: There is no evidence of pericardial effusion. Mitral Valve: The mitral valve is abnormal. Severe mitral annular calcification. Trivial mitral valve regurgitation. Mild to moderate mitral valve stenosis. Tricuspid Valve: The tricuspid valve is normal in structure. Tricuspid valve regurgitation is trivial. Aortic Valve: The aortic valve is tricuspid. Aortic valve regurgitation is not visualized. Mild to moderate aortic valve sclerosis/calcification is present, without any evidence of aortic stenosis. Pulmonic Valve: The pulmonic valve was not well visualized. Pulmonic valve regurgitation is not visualized. Aorta: The aortic root is normal in size and structure. Venous: The inferior vena cava is normal in size with less than 50% respiratory variability, suggesting right atrial pressure of 8 mmHg. IAS/Shunts: The interatrial septum was not well visualized.  LEFT VENTRICLE PLAX 2D LVOT diam:     1.70 cm     Diastology LV SV:         57          LV e' lateral:   3.49 cm/s LV SV Index:   35          LV E/e' lateral: 33.0 LVOT Area:     2.27 cm    LV e' medial:    3.24 cm/s                            LV E/e' medial:  35.5  LV Volumes (MOD) LV vol d, MOD A4C: 85.0 ml LV vol s, MOD A4C: 28.1 ml LV SV MOD A4C:     85.0 ml RIGHT VENTRICLE RV S prime:     16.20 cm/s TAPSE (M-mode): 2.3 cm LEFT ATRIUM           Index        RIGHT ATRIUM          Index LA Vol (A2C): 29.6 ml 17.85 ml/m RA Area:     9.82 cm LA Vol (A4C): 54.4 ml 32.81 ml/m RA Volume:   20.90 ml 12.60 ml/m  AORTIC VALVE LVOT Vmax:   123.00 cm/s LVOT Vmean:  84.100 cm/s LVOT VTI:    0.253 m  AORTA Ao Root diam: 2.60 cm MITRAL VALVE                TRICUSPID VALVE MV Area (PHT): 2.26 cm     TR Peak grad:   47.9 mmHg MV Decel Time: 335 msec     TR Vmax:        346.00 cm/s MV E velocity: 115.00 cm/s MV A velocity: 142.00 cm/s  SHUNTS MV E/A ratio:  0.81         Systemic VTI:  0.25 m  Systemic Diam: 1.70 cm Oswaldo Milian MD Electronically signed by Oswaldo Milian MD Signature Date/Time: 08/07/2020/3:41:25 PM    Final     Patient Profile     84 y.o. female with ESRD via a RUE AVF, occluded AVF in LUE, intolerance of AC due to recurrent GI bleed, tachy-brady syndrome, HTN, HLD, DM who is currently inpatient after developing chest pain/tachycardia during HD session.   Assessment & Plan    1. Tachy-brady syndrome Plan to observe response to digoxin during today sHD sessoin.  If poorly tolerated, she will need leadless pacing (she is not a candidate for any approach of transvenous pacing due to LUE occlusions and RUE AV fistula.   EP following along for response to HD today for further plan.   For questions or updates, please contact [facility] Please consult [email or web address] for contact info under Cardiology/STEMI.  Signed, Shirley Friar, PA-C  08/08/2020, 8:53 AM

## 2020-08-09 LAB — SARS CORONAVIRUS 2 BY RT PCR (HOSPITAL ORDER, PERFORMED IN ~~LOC~~ HOSPITAL LAB): SARS Coronavirus 2: NEGATIVE

## 2020-08-09 LAB — RENAL FUNCTION PANEL
Albumin: 2.4 g/dL — ABNORMAL LOW (ref 3.5–5.0)
Anion gap: 9 (ref 5–15)
BUN: 12 mg/dL (ref 8–23)
CO2: 27 mmol/L (ref 22–32)
Calcium: 8.2 mg/dL — ABNORMAL LOW (ref 8.9–10.3)
Chloride: 101 mmol/L (ref 98–111)
Creatinine, Ser: 2.53 mg/dL — ABNORMAL HIGH (ref 0.44–1.00)
GFR calc Af Amer: 20 mL/min — ABNORMAL LOW (ref >60)
GFR calc non Af Amer: 17 mL/min — ABNORMAL LOW (ref >60)
Glucose, Bld: 77 mg/dL (ref 70–99)
Phosphorus: 3.6 mg/dL (ref 2.5–4.6)
Potassium: 3.9 mmol/L (ref 3.5–5.1)
Sodium: 137 mmol/L (ref 135–145)

## 2020-08-09 LAB — CBC
HCT: 31.5 % — ABNORMAL LOW (ref 36.0–46.0)
Hemoglobin: 9.5 g/dL — ABNORMAL LOW (ref 12.0–15.0)
MCH: 32.8 pg (ref 26.0–34.0)
MCHC: 30.2 g/dL (ref 30.0–36.0)
MCV: 108.6 fL — ABNORMAL HIGH (ref 80.0–100.0)
Platelets: 199 10*3/uL (ref 150–400)
RBC: 2.9 MIL/uL — ABNORMAL LOW (ref 3.87–5.11)
RDW: 15.1 % (ref 11.5–15.5)
WBC: 6.9 10*3/uL (ref 4.0–10.5)
nRBC: 0 % (ref 0.0–0.2)

## 2020-08-09 MED ORDER — MIDODRINE HCL 5 MG PO TABS
5.0000 mg | ORAL_TABLET | Freq: Three times a day (TID) | ORAL | Status: DC
  Administered 2020-08-09 – 2020-08-10 (×2): 5 mg via ORAL
  Filled 2020-08-09 (×2): qty 1

## 2020-08-09 MED ORDER — DARBEPOETIN ALFA 100 MCG/0.5ML IJ SOSY: 100.0000 ug | PREFILLED_SYRINGE | INTRAMUSCULAR | Status: DC

## 2020-08-09 NOTE — Progress Notes (Signed)
[facility] Progress Note   Subjective:  Seen in room. Occ palpitations. No CP/dyspnea today. She did better with HD yesterday - she was hypotensive, but her HR was stable in NSR on monitor throughout, net UF ~970mL. D/w hospitalist today - plan is to try scheduled midodrine for hypotension if cardiology/EP teams agreeable.  Objective Vitals:   08/08/20 1600 08/08/20 1611 08/08/20 2001 08/09/20 0640  BP: (!) 76/52 (!) 123/41 (!) 120/91 (!) 90/41  Pulse:  88 67 (!) 55  Resp:  18 17 15   Temp:  98.1 F (36.7 C) 98.3 F (36.8 C) 98.6 F (37 C)  TempSrc:  Oral Axillary Axillary  SpO2:   100% 100%  Weight:  55.2 kg  56.7 kg   Physical Exam General:Frail woman, NAD. On nasal O2 Heart:RRR; no murmur Lungs:CTA anteriorly, no rales noted Abdomen:soft, non-tender Extremities:trace BLE edema Dialysis Access:R AVG + bruit  Additional Objective Labs: Basic Metabolic Panel: Recent Labs  Lab 08/05/20 0918 08/05/20 1428 08/06/20 0802 08/06/20 0802 08/07/20 0335 08/08/20 0629 08/09/20 0439  NA   < >  --  137   < > 136 136 137  K   < >  --  4.3   < > 4.2 4.5 3.9  CL   < >  --  99   < > 100 101 101  CO2   < >  --  27   < > 29 28 27   GLUCOSE   < >  --  81   < > 93 73 77  BUN   < >  --  18   < > 12 20 12   CREATININE   < >  --  2.97*   < > 2.28* 3.19* 2.53*  CALCIUM   < >  --  8.2*   < > 8.3* 8.3* 8.2*  PHOS  --  3.3 4.0  --   --   --  3.6   < > = values in this interval not displayed.   Liver Function Tests: Recent Labs  Lab 08/06/20 0802 08/09/20 0439  ALBUMIN 2.4* 2.4*   CBC: Recent Labs  Lab 08/05/20 0918 08/05/20 0918 08/06/20 0802 08/06/20 0802 08/07/20 0335 08/08/20 0629 08/09/20 0439  WBC 7.3   < > 6.4   < > 6.6 7.1 6.9  NEUTROABS 4.9  --   --   --  3.3 4.1  --   HGB 10.1*   < > 9.6*   < > 10.0* 9.8* 9.5*  HCT 33.1*   < > 32.0*   < > 33.1* 33.0* 31.5*  MCV 105.4*  --  107.0*  --  107.5* 107.5* 108.6*  PLT 172   < > 184   < > 174 209 199    < > = values in this interval not displayed.   Studies/Results: ECHOCARDIOGRAM COMPLETE  Result Date: 08/07/2020    ECHOCARDIOGRAM REPORT   Patient Name:   DILYN OSORIA [place] Date of Exam: 08/07/2020 Medical Rec #:  [id number]       Height:       65.0 in Accession #:    [id number]      Weight:       132.1 lb Date of Birth:  May 05, 1936       BSA:          1.658 m Patient Age:    84 years        BP:           74/58  mmHg Patient Gender: F               HR:           71 bpm. Exam Location:  Inpatient Procedure: 2D Echo, Cardiac Doppler, Color Doppler and Intracardiac            Opacification Agent Indications:    Murmur 785.2 / R01.1  History:        Patient has prior history of Echocardiogram examinations, most                 recent 07/06/2018. CHF, Arrythmias:Atrial Fibrillation,                 Signs/Symptoms:Chest Pain and Hypotension; Risk                 Factors:Hypertension, Diabetes and Non-Smoker.  Sonographer:    Vickie Epley RDCS Referring Phys: [id number] [place]  1. Left ventricular ejection fraction, by estimation, is 70 to 75%. The left ventricle has hyperdynamic function. The left ventricle has no regional wall motion abnormalities. There is moderate left ventricular hypertrophy. Left ventricular diastolic parameters are consistent with Grade II diastolic dysfunction (pseudonormalization). Elevated left atrial pressure.  2. Right ventricular systolic function is normal. The right ventricular size is normal. There is moderately elevated pulmonary artery systolic pressure. The estimated right ventricular systolic pressure is 50.5 mmHg.  3. The mitral valve is abnormal. Severe mitral annular calcification. Trivial mitral valve regurgitation. Mild to moderate mitral stenosis. MG 32mmHg at HR 70 bpm. MVA 1.2 cm^2 by continuity equation  4. The aortic valve is tricuspid. Aortic valve regurgitation is not visualized. Mild to moderate aortic valve sclerosis/calcification is present,  without any evidence of aortic stenosis.  5. The inferior vena cava is normal in size with <50% respiratory variability, suggesting right atrial pressure of 8 mmHg. FINDINGS  Left Ventricle: Left ventricular ejection fraction, by estimation, is 70 to 75%. The left ventricle has hyperdynamic function. The left ventricle has no regional wall motion abnormalities. Definity contrast agent was given IV to delineate the left ventricular endocardial borders. The left ventricular internal cavity size was small. There is moderate left ventricular hypertrophy. Left ventricular diastolic parameters are consistent with Grade II diastolic dysfunction (pseudonormalization). Elevated  left atrial pressure. Right Ventricle: The right ventricular size is normal. No increase in right ventricular wall thickness. Right ventricular systolic function is normal. There is moderately elevated pulmonary artery systolic pressure. The tricuspid regurgitant velocity is 3.46 m/s, and with an assumed right atrial pressure of 8 mmHg, the estimated right ventricular systolic pressure is 39.7 mmHg. Left Atrium: Left atrial size was normal in size. Right Atrium: Right atrial size was normal in size. Pericardium: There is no evidence of pericardial effusion. Mitral Valve: The mitral valve is abnormal. Severe mitral annular calcification. Trivial mitral valve regurgitation. Mild to moderate mitral valve stenosis. Tricuspid Valve: The tricuspid valve is normal in structure. Tricuspid valve regurgitation is trivial. Aortic Valve: The aortic valve is tricuspid. Aortic valve regurgitation is not visualized. Mild to moderate aortic valve sclerosis/calcification is present, without any evidence of aortic stenosis. Pulmonic Valve: The pulmonic valve was not well visualized. Pulmonic valve regurgitation is not visualized. Aorta: The aortic root is normal in size and structure. Venous: The inferior vena cava is normal in size with less than 50% respiratory  variability, suggesting right atrial pressure of 8 mmHg. IAS/Shunts: The interatrial septum was not well visualized.  LEFT VENTRICLE PLAX 2D LVOT diam:  1.70 cm     Diastology LV SV:         34          LV e' lateral:   3.49 cm/s LV SV Index:   35          LV E/e' lateral: 33.0 LVOT Area:     2.27 cm    LV e' medial:    3.24 cm/s                            LV E/e' medial:  35.5  LV Volumes (MOD) LV vol d, MOD A4C: 85.0 ml LV vol s, MOD A4C: 28.1 ml LV SV MOD A4C:     85.0 ml RIGHT VENTRICLE RV S prime:     16.20 cm/s TAPSE (M-mode): 2.3 cm LEFT ATRIUM           Index       RIGHT ATRIUM          Index LA Vol (A2C): 29.6 ml 17.85 ml/m RA Area:     9.82 cm LA Vol (A4C): 54.4 ml 32.81 ml/m RA Volume:   20.90 ml 12.60 ml/m  AORTIC VALVE LVOT Vmax:   123.00 cm/s LVOT Vmean:  84.100 cm/s LVOT VTI:    0.253 m  AORTA Ao Root diam: 2.60 cm MITRAL VALVE                TRICUSPID VALVE MV Area (PHT): 2.26 cm     TR Peak grad:   47.9 mmHg MV Decel Time: 335 msec     TR Vmax:        346.00 cm/s MV E velocity: 115.00 cm/s MV A velocity: 142.00 cm/s  SHUNTS MV E/A ratio:  0.81         Systemic VTI:  0.25 m                             Systemic Diam: 1.70 cm Oswaldo Milian MD Electronically signed by Oswaldo Milian MD Signature Date/Time: 08/07/2020/3:41:25 PM    Final    Medications:  . (feeding supplement) PROSource Plus  30 mL Oral BID BM  . allopurinol  100 mg Oral Daily  . amiodarone  400 mg Oral BID  . aspirin  81 mg Oral Daily  . azelastine  2 spray Each Nare BID  . bisacodyl  10 mg Rectal Once  . brinzolamide  1 drop Both Eyes BID   Or  . brimonidine  1 drop Both Eyes BID  . calcium carbonate  1 tablet Oral BID  . Chlorhexidine Gluconate Cloth  6 each Topical Q0600  . darbepoetin (ARANESP) injection - DIALYSIS  40 mcg Intravenous Q Mon-HD  . diazepam  5 mg Oral QHS  . diclofenac Sodium  2 g Topical QHS  . digoxin  0.0625 mg Oral Q M,W,F  . dimethicone  1 application Topical BID  .  diphenoxylate-atropine  1 tablet Oral Q M,W,F-HD  . fluticasone  2 spray Each Nare Daily  . ipratropium  2 spray Each Nare Q12H  . loperamide  4 mg Oral Q M,W,F-HD  . loratadine  10 mg Oral Daily  . melatonin  10 mg Oral QHS  . midodrine  10 mg Oral Q M,W,F-HD  . multivitamin  1 tablet Oral QHS  . traZODone  50 mg Oral QHS    [facility]  Time: 3 hours 45 min, 3K, 2.25Ca, BFR 400, DFR 800, EDW 54.5kg, RUE AVG, heparin 3300 unit bolus Mircera 30 mcg IVP q 2 weeks (last given 07/23/20) Hectorol 1 mcg IV q HD  Assessment/Plan: 1. A fib with TCN:[id number] and hypotensive at HD on 08/03/20,8/14, 8/16.On amiodarone, midodrine, and started on digoxin 8/17. Did much better with HD on 8/18 - was still hypotensive, but HR was stable throughout. Perprimary/cardiology. Chronically low potassium, aiming to keep K > 4. 2. ESRD:Continue HD onMWF schedule,using4K bath->next HD 8/20. 3. Hypotension/volume:BPlowwith volume excess although much improved, on amiodarone+ dig now. Takes midodrine 10mg  before dialysis and another 10mg  mid treatment as needed. ?Consider midodrine TID dosing. 4. Anemia:Hgb 9.5. Continue Aranesp q Monday, will increase next dose. 5. Metabolic bone disease:CorrCa/Phos ok.Continue TumsBID. Not on other binder. 6. Nutrition:Albumin low,continue protein supplements.  [name], PA-C 08/09/2020, 10:18 AM  [employer]

## 2020-08-09 NOTE — Progress Notes (Addendum)
  Telemetry reviewed.   Pt tolerated HD without breakthrough arrhyhtmia yesterday.   Would continue current medications.   Follow digoxin level closely given ESRD.   We will see as needed while here and make close outpatient follow up.   Would continue amiodarone 400 mg BID x 1 more week then to 200 mg BID until follow up with Dr. Quentin Ore.   Please call back with questions. All above reviewed with Dr. Quentin Ore.   Legrand Como [address]" [place], PA-C  08/09/2020 7:29 AM

## 2020-08-09 NOTE — Progress Notes (Signed)
PROGRESS NOTE    [name] James  TDV:[id number] DOB: 02-11-1936 DOA: 08/03/2020 PCP: Patrick Jupiter   Brief Narrative:  HPI on 08/03/2020 by Dr. Gala Romney [name] James is a 84 y.o. female with medical history significant of end-stage renal disease on hemodialysis Mondays Wednesdays and Fridays, GERD, atrial fibrillation, hyperlipidemia, hypertension, diabetes who was at hemodialysis today when she started having chest discomfort and noted to have A. fib with RVR.  She was sent to the ER before completing her hemodialysis.  Patient was seen and evaluated.  She was seen here last night with exactly the same symptoms.  At that point cardiology was consulted.  Patient was placed on amiodarone and Lopressor was discontinued with.  When she came to the ER today she was found to have rates also in the 130s 140s.  She was given a dose of Lopressor which controlled her rate now but maintained still on her amiodarone.  Patient noted to have profound hypotension.  Systolics remaining in the 80s.  She is also weak and debilitated.  She is therefore being admitted for observation to manage her hypotension.  Nephrology consulted.  Interim history Patient with atrial fibrillation/flutter with RVR.  Cardiology consulted.  This keeps occurring during dialysis along with hypotension.  Cardiology currently titrating medications.  EP also consulted. Assessment & Plan   Paroxysmal atrial fibrillation/flutter with RVR -Patient presented with heart rates of 130s to 140s which required Lopressor in the emergency department -EP has been consulted and patient was started on amiodarone which is now been increased to 400 mg twice daily.  Discussed with EP, will continue 400 mg twice daily for 1 week followed by 200 mg twice daily thereafter until she follows up with Dr. Quentin Ore. -She continues to have tachycardia during dialysis along with hypotension -Patient has been started on digoxin  -EP would like to monitor how  patient does with dialysis today on digoxin.  She may need leadless pacing.  She would not be a candidate for any approach of transvenous pacing due to LUE occlusions and LUE AV fistula.   Hypotension -BP has noted to be on the lower side -She is currently on midodrine with HD -Discussed with nephrology starting Midodrine every day, recommended 5 mg 3 times daily= will also confirm with cardiology if this ok to start daily -Continue to monitor  End-stage renal disease on hemodialysis -Patient dialyzes on Monday, Wednesday, Friday -Nephrology consulted and appreciated -Patient did tolerate dialysis on 08/08/2020 with digoxin without seen arrhythmia on telemetry monitoring. -Patient will dialyze on 08/10/2020  Anemia of chronic disease -Stable, continue to monitor CBC  Chronic macrocytosis -Questionable cause.  Vitamin B12 and folate are within normal limits  Chronic diastolic heart failure -Does not appear to be volume overload at this time -Continue volume control with dialysis -Monitor intake and output and daily weights  Diabetes mellitus, type II -Continue insulin sliding scale and CBG monitoring  Stage II sacral ulcer -Present on admission -Continue wound care  Chronic hypoxic respiratory failure -Patient uses 2 L at baseline -Stable  Generalized deconditioning/goals of care -Patient and daughter are not interested in palliative care discussions -Per previous hospitalist daughter states her mother is hard of hearing and does not understand the concept of these discussions although patient has expressed to multiple providers that she would like to pass away in peace.  Daughter does not want any more discussions regarding this  DVT Prophylaxis  SCDs  Code Status: Full  Family Communication: None at bedside  Disposition Plan:  Status is: Inpatient  Remains inpatient appropriate because:Hemodynamically unstable- continues to have hypotension.   Dispo: The patient is  from: SNF              Anticipated d/c is to: SNF              Anticipated d/c date is: 08/10/2020              Patient currently is not medically stable to d/c.   Consultants Cardiology/ EP Nephrology  Palliative care  Procedures  None  Antibiotics   Anti-infectives (From admission, onward)   None      Subjective:   [name] James seen and examined today.  Patient states that she feels unwell this morning but cannot elaborate on how.  Denies chest pain or shortness of breath, denies abdominal pain, nausea or vomiting.    Objective:   Vitals:   08/08/20 1611 08/08/20 2001 08/09/20 0640 08/09/20 1112  BP: (!) 123/41 (!) 120/91 (!) 90/41 108/73  Pulse: 88 67 (!) 55 62  Resp: 18 17 15 18   Temp: 98.1 F (36.7 C) 98.3 F (36.8 C) 98.6 F (37 C) 97.6 F (36.4 C)  TempSrc: Oral Axillary Axillary Oral  SpO2:  100% 100% 100%  Weight: 55.2 kg  56.7 kg     Intake/Output Summary (Last 24 hours) at 08/09/2020 1154 Last data filed at 08/08/2020 1611 Gross per 24 hour  Intake --  Output 931 ml  Net -931 ml   Filed Weights   08/08/20 1235 08/08/20 1611 08/09/20 0640  Weight: 56.9 kg 55.2 kg 56.7 kg   Exam  General: Well developed, chronically ill-appearing, NAD  HEENT: NCAT,  mucous membranes moist.   Cardiovascular: S1 S2 auscultated, RRR, no murmur.  Respiratory: Clear to auscultation bilaterally   Abdomen: Soft, nontender, nondistended, + bowel sounds  Extremities: warm dry without cyanosis clubbing. Trace LE edema  Neuro: AAOx3, hard of hearing, poor historian, otherwise nonfocal  Psych: Pleasant, appropriate mood and affect   Data Reviewed: I have personally reviewed following labs and imaging studies  CBC: Recent Labs  Lab 08/05/20 0918 08/06/20 0802 08/07/20 0335 08/08/20 0629 08/09/20 0439  WBC 7.3 6.4 6.6 7.1 6.9  NEUTROABS 4.9  --  3.3 4.1  --   HGB 10.1* 9.6* 10.0* 9.8* 9.5*  HCT 33.1* 32.0* 33.1* 33.0* 31.5*  MCV 105.4* 107.0* 107.5* 107.5*  108.6*  PLT 172 184 174 209 469   Basic Metabolic Panel: Recent Labs  Lab 08/03/20 1450 08/04/20 0342 08/05/20 0918 08/05/20 1428 08/06/20 0802 08/07/20 0335 08/08/20 0629 08/09/20 0439  NA 138   < > 138  --  137 136 136 137  K 4.5   < > 4.2  --  4.3 4.2 4.5 3.9  CL 100   < > 101  --  99 100 101 101  CO2 25   < > 28  --  27 29 28 27   GLUCOSE 105*   < > 166*  --  81 93 73 77  BUN 21   < > 11  --  18 12 20 12   CREATININE 3.32*   < > 2.33*  --  2.97* 2.28* 3.19* 2.53*  CALCIUM 7.5*   < > 7.7*  --  8.2* 8.3* 8.3* 8.2*  MG 1.9  --   --   --   --   --   --   --   PHOS  --   --   --  3.3 4.0  --   --  3.6   < > = values in this interval not displayed.   GFR: Estimated Creatinine Clearance: 15.3 mL/min (A) (by C-G formula based on SCr of 2.53 mg/dL (H)). Liver Function Tests: Recent Labs  Lab 08/06/20 0802 08/09/20 0439  ALBUMIN 2.4* 2.4*   No results for input(s): LIPASE, AMYLASE in the last 168 hours. No results for input(s): AMMONIA in the last 168 hours. Coagulation Profile: No results for input(s): INR, PROTIME in the last 168 hours. Cardiac Enzymes: No results for input(s): CKTOTAL, CKMB, CKMBINDEX, TROPONINI in the last 168 hours. BNP (last 3 results) No results for input(s): PROBNP in the last 8760 hours. HbA1C: No results for input(s): HGBA1C in the last 72 hours. CBG: Recent Labs  Lab 08/08/20 1137  GLUCAP 131*   Lipid Profile: No results for input(s): CHOL, HDL, LDLCALC, TRIG, CHOLHDL, LDLDIRECT in the last 72 hours. Thyroid Function Tests: No results for input(s): TSH, T4TOTAL, FREET4, T3FREE, THYROIDAB in the last 72 hours. Anemia Panel: No results for input(s): VITAMINB12, FOLATE, FERRITIN, TIBC, IRON, RETICCTPCT in the last 72 hours. Urine analysis:    Component Value Date/Time   COLORURINE AMBER (A) 03/14/2019 0435   APPEARANCEUR TURBID (A) 03/14/2019 0435   LABSPEC 1.014 03/14/2019 [place] 7.0 03/14/2019 0435   GLUCOSEU NEGATIVE  03/14/2019 0435   HGBUR SMALL (A) 03/14/2019 0435   BILIRUBINUR NEGATIVE 03/14/2019 0435   BILIRUBINUR neg 04/02/2014 0848   KETONESUR NEGATIVE 03/14/2019 0435   PROTEINUR 100 (A) 03/14/2019 0435   UROBILINOGEN 0.2 10/26/2014 2043   NITRITE POSITIVE (A) 03/14/2019 0435   LEUKOCYTESUR LARGE (A) 03/14/2019 0435   Sepsis Labs: @LABRCNTIP (procalcitonin:4,lacticidven:4)  ) Recent Results (from the past 240 hour(s))  SARS Coronavirus 2 by RT PCR (hospital order, performed in [place] hospital lab) Nasopharyngeal Nasopharyngeal Swab     Status: None   Collection Time: 08/03/20  8:10 PM   Specimen: Nasopharyngeal Swab  Result Value Ref Range Status   SARS Coronavirus 2 NEGATIVE NEGATIVE Final    Comment: (NOTE) SARS-CoV-2 target nucleic acids are NOT DETECTED.  The SARS-CoV-2 RNA is generally detectable in upper and lower respiratory specimens during the acute phase of infection. The lowest concentration of SARS-CoV-2 viral copies this assay can detect is 250 copies / mL. A negative result does not preclude SARS-CoV-2 infection and should not be used as the sole basis for treatment or other patient management decisions.  A negative result may occur with improper specimen collection / handling, submission of specimen other than nasopharyngeal swab, presence of viral mutation(s) within the areas targeted by this assay, and inadequate number of viral copies (<250 copies / mL). A negative result must be combined with clinical observations, patient history, and epidemiological information.  Fact Sheet for Patients:   StrictlyIdeas.no  Fact Sheet for Healthcare Providers: BankingDealers.co.za  This test is not yet approved or  cleared by the [place] FDA and has been authorized for detection and/or diagnosis of SARS-CoV-2 by FDA under an Emergency Use Authorization (EUA).  This EUA will remain in effect (meaning this test can be  used) for the duration of the COVID-19 declaration under Section 564(b)(1) of the Act, 21 U.S.C. section 360bbb-3(b)(1), unless the authorization is terminated or revoked sooner.  Performed at [facility], [place] [address], [place], [place] [address]   SARS Coronavirus 2 by RT PCR (hospital order, performed in [facility] hospital lab) Nasopharyngeal Nasopharyngeal Swab  Status: None   Collection Time: 08/09/20  7:53 AM   Specimen: Nasopharyngeal Swab  Result Value Ref Range Status   SARS Coronavirus 2 NEGATIVE NEGATIVE Final    Comment: (NOTE) SARS-CoV-2 target nucleic acids are NOT DETECTED.  The SARS-CoV-2 RNA is generally detectable in upper and lower respiratory specimens during the acute phase of infection. The lowest concentration of SARS-CoV-2 viral copies this assay can detect is 250 copies / mL. A negative result does not preclude SARS-CoV-2 infection and should not be used as the sole basis for treatment or other patient management decisions.  A negative result may occur with improper specimen collection / handling, submission of specimen other than nasopharyngeal swab, presence of viral mutation(s) within the areas targeted by this assay, and inadequate number of viral copies (<250 copies / mL). A negative result must be combined with clinical observations, patient history, and epidemiological information.  Fact Sheet for Patients:   StrictlyIdeas.no  Fact Sheet for Healthcare Providers: BankingDealers.co.za  This test is not yet approved or  cleared by the [place] FDA and has been authorized for detection and/or diagnosis of SARS-CoV-2 by FDA under an Emergency Use Authorization (EUA).  This EUA will remain in effect (meaning this test can be used) for the duration of the COVID-19 declaration under Section 564(b)(1) of the Act, 21 U.S.C. section 360bbb-3(b)(1), unless the authorization is terminated  or revoked sooner.  Performed at [facility], [place] [address], [place], [place] [address]       Radiology Studies: No results found.   Scheduled Meds: . (feeding supplement) PROSource Plus  30 mL Oral BID BM  . allopurinol  100 mg Oral Daily  . amiodarone  400 mg Oral BID  . aspirin  81 mg Oral Daily  . azelastine  2 spray Each Nare BID  . bisacodyl  10 mg Rectal Once  . brinzolamide  1 drop Both Eyes BID   Or  . brimonidine  1 drop Both Eyes BID  . calcium carbonate  1 tablet Oral BID  . Chlorhexidine Gluconate Cloth  6 each Topical Q0600  . [START ON 08/13/2020] darbepoetin (ARANESP) injection - DIALYSIS  100 mcg Intravenous Q Mon-HD  . diazepam  5 mg Oral QHS  . diclofenac Sodium  2 g Topical QHS  . digoxin  0.0625 mg Oral Q M,W,F  . dimethicone  1 application Topical BID  . diphenoxylate-atropine  1 tablet Oral Q M,W,F-HD  . fluticasone  2 spray Each Nare Daily  . ipratropium  2 spray Each Nare Q12H  . loperamide  4 mg Oral Q M,W,F-HD  . loratadine  10 mg Oral Daily  . melatonin  10 mg Oral QHS  . midodrine  10 mg Oral Q M,W,F-HD  . multivitamin  1 tablet Oral QHS  . traZODone  50 mg Oral QHS   Continuous Infusions:   LOS: 5 days   Time Spent in minutes   45 minutes  Serafin Dec[place] D.O. on 08/09/2020 at 11:54 AM  Between 7am to 7pm - Please see pager noted on [email or web address]  After 7pm go to [email or web address]  And look for the night coverage person covering for me after hours  [facility] Office  [phone]

## 2020-08-09 NOTE — TOC Progression Note (Signed)
Transition of Care [facility]) - Progression Note    Patient Details  Name: [name] James MRN: [id number] Date of Birth: 30-Jun-1936  Transition of Care [facility]) CM/SW [place], [place] Phone Number: 08/09/2020, 1:57 PM  Clinical Narrative:     CSW called  [place] and they confirmed that patient is long term resident there. Plan is for her to return there when medically ready. CSW working with renal navigator Terri Piedra who is helping coordinate HD for patient.  Plan is for patient to return to [facility] when medically ready.  CSW will continue to follow.   Expected Discharge Plan: [place] Barriers to Discharge: No Barriers Identified  Expected Discharge Plan and Services Expected Discharge Plan: [place] arrangements for the past 2 months: [place]                                       Social Determinants of Health (SDOH) Interventions    Readmission Risk Interventions No flowsheet data found.

## 2020-08-09 NOTE — Plan of Care (Signed)
  Problem: Education: Goal: Knowledge of General Education information will improve Description: Including pain rating scale, medication(s)/side effects and non-pharmacologic comfort measures Outcome: Progressing   Problem: Clinical Measurements: Goal: Will remain free from infection Outcome: Progressing   

## 2020-08-09 NOTE — NC FL2 (Signed)
[place] MEDICAID FL2 LEVEL OF CARE SCREENING TOOL     IDENTIFICATION  Patient Name: [name] James Birthdate: 1936-07-01 Sex: female Admission Date (Current Location): 08/03/2020  [facility] and [place] Number:  Herbalist and Address:  [facility] [place] [place] [address] [place], [place] [address]      Provider Number: [id number]  Attending Physician Name and Address:  Cristal Ford, DO  Relative Name and Phone Number:  Kenney Houseman [phone]    Current Level of Care: Hospital Recommended Level of Care: [place] Prior Approval Number:    Date Approved/Denied:   PASRR Number: [id number]  Discharge Plan: SNF    Current Diagnoses: Patient Active Problem List   Diagnosis Date Noted  . Atrial fibrillation with RVR ([place]) 08/03/2020  . Nausea and vomiting 03/14/2019  . Chronic diarrhea 03/14/2019  . Dysuria 03/14/2019  . UTI (urinary tract infection) 03/14/2019  . Aortic atherosclerosis ([place]) 12/30/2018  . Hypoalbuminemia due to protein-calorie malnutrition ([place]) 12/30/2018  . Tachycardia-bradycardia syndrome ([place])   . Symptomatic bradycardia 08/24/2018  . Gout 08/23/2018  . Anxiety 08/23/2018  . Pneumothorax, left 10/13/2017  . Multiple lung nodules on CT 10/06/2017  . Pleural effusion, left 10/06/2017  . Diabetic polyneuropathy associated with type 2 diabetes mellitus ([place]) 07/30/2017  . Onychomycosis of toenail 07/30/2017  . Idiopathic chronic venous hypertension of both lower extremities with inflammation 07/30/2017  . Chronic respiratory failure with hypoxia ([place]) 04/29/2017  . Advance care planning   . Goals of care, counseling/discussion   . Palliative care by specialist   . Pressure ulcer 01/19/2017  . History of pressure ulcer 10/17/2016  . GAD (generalized anxiety disorder) 10/17/2016  . Osteopenia determined by x-ray 04/17/2016  . Type 2 diabetes mellitus with diabetic neuropathy ([place]) 06/08/2015  . Type 2  diabetes, controlled, with renal manifestation ([place]) 06/08/2015  . PVD (peripheral vascular disease) ([place]) 06/08/2015  . Swelling of limb-Left arm 04/10/2015  . Nausea with vomiting 01/12/2015  . Normocytic anemia 01/12/2015  . Paroxysmal atrial fibrillation ([place]) 12/22/2014  . Melena   . ESRD on dialysis ([place]) 12/19/2014  . Chronic diastolic (congestive) heart failure ([place]) 12/19/2014  . Palpitation 12/19/2014  . Anemia, chronic disease 12/13/2014  . Hypotension 12/13/2014  . Pulmonary edema 11/10/2014  . Physical deconditioning 10/29/2014  . Gastritis and gastroduodenitis 10/24/2014  . Heme positive stool 10/24/2014  . Morbid obesity ([place]) 10/18/2014  . Edema 10/18/2014  . Essential hypertension 10/17/2014  . DM type 2, uncontrolled, with renal complications ([place]) 07/37/1062  . Dyslipidemia 10/17/2014    Orientation RESPIRATION BLADDER Height & Weight     Self, Time, Situation, Place  O2 (Nasal Cannula 3 liters) Incontinent Weight: 125 lb (56.7 kg) Height:     BEHAVIORAL SYMPTOMS/MOOD NEUROLOGICAL BOWEL NUTRITION STATUS      Continent Diet (See Discharge Summary)  AMBULATORY STATUS COMMUNICATION OF NEEDS Skin   Limited Assist Verbally Other (Comment) (App.for ethnicity,dry intact;PressureInjury Sacrum Stage 2;Partial thinkness loss of dermis presenting asa shallow open injury with a red pink wound bed without slough;Foam lift dressing clean;dry;intact'PRN;Wound assessment clean;dry)                       Personal Care Assistance Level of Assistance  Bathing, Feeding, Dressing Bathing Assistance: Limited assistance Feeding assistance: Independent Dressing Assistance: Limited assistance     Functional Limitations Info  Sight, Hearing, Speech Sight Info: Impaired Hearing Info: Adequate Speech Info: Adequate    SPECIAL CARE FACTORS  FREQUENCY  PT (By licensed PT), OT (By licensed OT)     PT Frequency: 5x min weekly OT Frequency: 5x min weekly             Contractures Contractures Info: Not present    Additional Factors Info  Code Status, Allergies Code Status Info: FULL Allergies Info: Erythromycin,Codeine,Erythromycin Base,Penicillins,Zantac           Current Medications (08/09/2020):  This is the current hospital active medication list Current Facility-Administered Medications  Medication Dose Route Frequency Provider Last Rate Last Admin  . (feeding supplement) PROSource Plus liquid 30 mL  30 mL Oral BID BM Stovall, Woodfin Ganja, PA-C      . acetaminophen (TYLENOL) tablet 650 mg  650 mg Oral Q4H PRN Gala Romney L, MD      . allopurinol (ZYLOPRIM) tablet 100 mg  100 mg Oral Daily Gala Romney L, MD   100 mg at 08/09/20 1136  . amiodarone (PACERONE) tablet 400 mg  400 mg Oral BID Richardson Dopp T, PA-C   400 mg at 08/09/20 1136  . aspirin chewable tablet 81 mg  81 mg Oral Daily Elwyn Reach, MD   81 mg at 08/09/20 1136  . azelastine (ASTELIN) 0.1 % nasal spray 2 spray  2 spray Each Nare BID Elwyn Reach, MD   2 spray at 08/08/20 2259  . bisacodyl (DULCOLAX) suppository 10 mg  10 mg Rectal Once Cristal Ford, DO      . brinzolamide (AZOPT) 1 % ophthalmic suspension 1 drop  1 drop Both Eyes BID Alekh, Kshitiz, MD   1 drop at 08/08/20 2258   Or  . brimonidine (ALPHAGAN) 0.2 % ophthalmic solution 1 drop  1 drop Both Eyes BID Aline August, MD   1 drop at 08/09/20 1145  . calcium carbonate (TUMS - dosed in mg elemental calcium) chewable tablet 200 mg of elemental calcium  1 tablet Oral BID Gala Romney L, MD   200 mg of elemental calcium at 08/09/20 1135  . Chlorhexidine Gluconate Cloth 2 % PADS 6 each  6 each Topical Q0600 Loren Racer, PA-C   6 each at 08/08/20 0640  . cholestyramine (QUESTRAN) packet 4 g  4 g Oral BID PRN Elwyn Reach, MD   4 g at 08/06/20 1059  . [START ON 08/13/2020] Darbepoetin Alfa (ARANESP) injection 100 mcg  100 mcg Intravenous Q Mon-HD Stovall, Kathryn R, PA-C      . diazepam  (VALIUM) tablet 5 mg  5 mg Oral QHS Elwyn Reach, MD   5 mg at 08/08/20 2253  . diclofenac Sodium (VOLTAREN) 1 % topical gel 2 g  2 g Topical QHS Elwyn Reach, MD   2 g at 08/08/20 2256  . digoxin (LANOXIN) tablet 0.0625 mg  0.0625 mg Oral Q M,W,F Furth, Cadence H, PA-C   0.0625 mg at 08/08/20 1012  . dimethicone 1 % cream 1 application  1 application Topical BID Elwyn Reach, MD   1 application at 57/84/69 2259  . diphenoxylate-atropine (LOMOTIL) 2.5-0.025 MG per tablet 1 tablet  1 tablet Oral Q M,W,F-HD Garba, Mohammad L, MD      . fluticasone (FLONASE) 50 MCG/ACT nasal spray 2 spray  2 spray Each Nare Daily Elwyn Reach, MD   2 spray at 08/09/20 1144  . guaiFENesin (ROBITUSSIN) 100 MG/5ML solution 100 mg  5 mL Oral Q4H PRN Aline August, MD   100 mg at 08/05/20 1523  . ipratropium (ATROVENT) 0.06 %  nasal spray 2 spray  2 spray Each Nare Q12H Elwyn Reach, MD   2 spray at 08/09/20 1147  . loperamide (IMODIUM) capsule 4 mg  4 mg Oral Q M,W,F-HD Jonelle Sidle, Mohammad L, MD      . loratadine (CLARITIN) tablet 10 mg  10 mg Oral Daily Gala Romney L, MD   10 mg at 08/09/20 1141  . melatonin tablet 10 mg  10 mg Oral QHS Elwyn Reach, MD   10 mg at 08/08/20 2253  . metoprolol tartrate (LOPRESSOR) injection 2.5 mg  2.5 mg Intravenous Once PRN Shela Leff, MD      . midodrine (PROAMATINE) tablet 10 mg  10 mg Oral Q M,W,F-HD Anice Paganini G, PA-C   10 mg at 08/08/20 1138  . midodrine (PROAMATINE) tablet 2.5 mg  2.5 mg Oral BID PRN Elwyn Reach, MD   2.5 mg at 08/08/20 0639  . midodrine (PROAMATINE) tablet 5 mg  5 mg Oral TID WC Mikhail, Maryann, DO      . multivitamin (RENA-VIT) tablet 1 tablet  1 tablet Oral QHS Elwyn Reach, MD   1 tablet at 08/08/20 2253  . ondansetron (ZOFRAN) injection 4 mg  4 mg Intravenous Q6H PRN Elwyn Reach, MD   4 mg at 08/08/20 2305  . traZODone (DESYREL) tablet 50 mg  50 mg Oral QHS Elwyn Reach, MD   50 mg at 08/08/20  2254     Discharge Medications: Please see discharge summary for a list of discharge medications.  Relevant Imaging Results:  Relevant Lab Results:   Additional Information [id number]  Trula Ore, LCSWA

## 2020-08-10 LAB — RENAL FUNCTION PANEL
Albumin: 2.6 g/dL — ABNORMAL LOW (ref 3.5–5.0)
Anion gap: 10 (ref 5–15)
BUN: 22 mg/dL (ref 8–23)
CO2: 24 mmol/L (ref 22–32)
Calcium: 8.2 mg/dL — ABNORMAL LOW (ref 8.9–10.3)
Chloride: 100 mmol/L (ref 98–111)
Creatinine, Ser: 3.29 mg/dL — ABNORMAL HIGH (ref 0.44–1.00)
GFR calc Af Amer: 14 mL/min — ABNORMAL LOW (ref >60)
GFR calc non Af Amer: 12 mL/min — ABNORMAL LOW (ref >60)
Glucose, Bld: 92 mg/dL (ref 70–99)
Phosphorus: 4.5 mg/dL (ref 2.5–4.6)
Potassium: 4.4 mmol/L (ref 3.5–5.1)
Sodium: 134 mmol/L — ABNORMAL LOW (ref 135–145)

## 2020-08-10 LAB — CBC
HCT: 33.2 % — ABNORMAL LOW (ref 36.0–46.0)
Hemoglobin: 9.9 g/dL — ABNORMAL LOW (ref 12.0–15.0)
MCH: 32 pg (ref 26.0–34.0)
MCHC: 29.8 g/dL — ABNORMAL LOW (ref 30.0–36.0)
MCV: 107.4 fL — ABNORMAL HIGH (ref 80.0–100.0)
Platelets: 220 10*3/uL (ref 150–400)
RBC: 3.09 MIL/uL — ABNORMAL LOW (ref 3.87–5.11)
RDW: 14.9 % (ref 11.5–15.5)
WBC: 9 10*3/uL (ref 4.0–10.5)
nRBC: 0 % (ref 0.0–0.2)

## 2020-08-10 LAB — DIGOXIN LEVEL: Digoxin Level: 1 ng/mL (ref 0.8–2.0)

## 2020-08-10 MED ORDER — HEPARIN SODIUM (PORCINE) 1000 UNIT/ML DIALYSIS
20.0000 [IU]/kg | Freq: Once | INTRAMUSCULAR | Status: AC
  Administered 2020-08-10: 1100 [IU] via INTRAVENOUS_CENTRAL

## 2020-08-10 MED ORDER — MIDODRINE HCL 5 MG PO TABS: 5.0000 mg | ORAL_TABLET | Freq: Three times a day (TID) | ORAL | Status: DC

## 2020-08-10 MED ORDER — HEPARIN SODIUM (PORCINE) 1000 UNIT/ML IJ SOLN
INTRAMUSCULAR | Status: AC
  Filled 2020-08-10: qty 1

## 2020-08-10 MED ORDER — AMIODARONE HCL 200 MG PO TABS
400.0000 mg | ORAL_TABLET | Freq: Every day | ORAL | Status: DC
  Administered 2020-08-10: 400 mg via ORAL
  Filled 2020-08-10: qty 2

## 2020-08-10 MED ORDER — MIDODRINE HCL 5 MG PO TABS
ORAL_TABLET | ORAL | Status: AC
  Filled 2020-08-10: qty 2

## 2020-08-10 MED ORDER — DIGOXIN 62.5 MCG PO TABS: 0.0625 mg | ORAL_TABLET | ORAL | Status: DC

## 2020-08-10 MED ORDER — MIDODRINE HCL 10 MG PO TABS: 10.0000 mg | ORAL_TABLET | ORAL | Status: DC

## 2020-08-10 MED ORDER — AMIODARONE HCL 200 MG PO TABS: ORAL_TABLET | ORAL | Status: DC

## 2020-08-10 NOTE — Progress Notes (Signed)
[facility] Progress Note   Subjective:  Seen on hd. No events on tele thus far. uf goal 2.5L, tolerating treatment. Asymptomatic bradycardia overnight when sleeping. Amiodarone adjusted per EP.  Objective Vitals:   08/10/20 0800 08/10/20 0830 08/10/20 0900 08/10/20 0930  BP: (!) 100/13 (!) 100/17 (!) 109/25 (!) 144/37  Pulse: (!) 55 61 61 71  Resp:      Temp:      TempSrc:      SpO2:      Weight:       Physical Exam General:Frail woman, NAD. On nasal O2 Heart:RRR; no murmur Lungs:CTA anteriorly, no rales noted Abdomen:soft, non-tender Extremities:trace BLE edema Dialysis Access:R AVG + bruit  Additional Objective Labs: Basic Metabolic Panel: Recent Labs  Lab 08/06/20 0802 08/07/20 0335 08/08/20 0629 08/09/20 0439 08/10/20 0420  NA 137   < > 136 137 134*  K 4.3   < > 4.5 3.9 4.4  CL 99   < > 101 101 100  CO2 27   < > 28 27 24   GLUCOSE 81   < > 73 77 92  BUN 18   < > 20 12 22   CREATININE 2.97*   < > 3.19* 2.53* 3.29*  CALCIUM 8.2*   < > 8.3* 8.2* 8.2*  PHOS 4.0  --   --  3.6 4.5   < > = values in this interval not displayed.   Liver Function Tests: Recent Labs  Lab 08/06/20 0802 08/09/20 0439 08/10/20 0420  ALBUMIN 2.4* 2.4* 2.6*   CBC: Recent Labs  Lab 08/05/20 0918 08/05/20 1478 08/06/20 0802 08/06/20 0802 08/07/20 0335 08/07/20 0335 08/08/20 0629 08/09/20 0439 08/10/20 0420  WBC 7.3   < > 6.4   < > 6.6   < > 7.1 6.9 9.0  NEUTROABS 4.9  --   --   --  3.3  --  4.1  --   --   HGB 10.1*   < > 9.6*   < > 10.0*   < > 9.8* 9.5* 9.9*  HCT 33.1*   < > 32.0*   < > 33.1*   < > 33.0* 31.5* 33.2*  MCV 105.4*   < > 107.0*  --  107.5*  --  107.5* 108.6* 107.4*  PLT 172   < > 184   < > 174   < > 209 199 220   < > = values in this interval not displayed.   Studies/Results: No results found. Medications:  . (feeding supplement) PROSource Plus  30 mL Oral BID BM  . allopurinol  100 mg Oral Daily  . amiodarone  400 mg Oral Daily  .  aspirin  81 mg Oral Daily  . azelastine  2 spray Each Nare BID  . bisacodyl  10 mg Rectal Once  . brinzolamide  1 drop Both Eyes BID   Or  . brimonidine  1 drop Both Eyes BID  . calcium carbonate  1 tablet Oral BID  . [START ON 08/13/2020] darbepoetin (ARANESP) injection - DIALYSIS  100 mcg Intravenous Q Mon-HD  . diazepam  5 mg Oral QHS  . diclofenac Sodium  2 g Topical QHS  . digoxin  0.0625 mg Oral Q M,W,F  . dimethicone  1 application Topical BID  . diphenoxylate-atropine  1 tablet Oral Q M,W,F-HD  . fluticasone  2 spray Each Nare Daily  . heparin sodium (porcine)      . ipratropium  2 spray Each Nare Q12H  . loperamide  4 mg Oral Q M,W,F-HD  . loratadine  10 mg Oral Daily  . melatonin  10 mg Oral QHS  . midodrine      . midodrine  10 mg Oral Q M,W,F-HD  . midodrine  5 mg Oral TID WC  . multivitamin  1 tablet Oral QHS  . traZODone  50 mg Oral QHS    [facility] Orders[facility] Time: 3 hours 45 min, 3K, 2.25Ca, BFR 400, DFR 800, EDW 54.5kg, RUE AVG, heparin 3300 unit bolus Mircera 30 mcg IVP q 2 weeks (last given 07/23/20) Hectorol 1 mcg IV q HD  Assessment/Plan: 1. A fib with WTG:RMBOBOFPULG and hypotensive at HD on 08/03/20,8/14, 8/16.On amiodarone, midodrine, and started on digoxin 8/17. Perprimary/cardiology. Chronically low potassium, aiming to keep K > 4. Doing well on dialysis thus far 2. ESRD:Continue HD onMWF schedule,using4K bath->next HD 8/23. 3. Hypotension/volume:BPlowwith volume excess although much improved, on amiodarone+ dig now. Takes midodrine 10mg  before dialysis and another 10mg  mid treatment as needed. Now on midodrine tid dosing even on off-HD days. Will decrease UF goal today to 1-1.5L 4. Anemia:Hgb 9.9. Continue Aranesp q Monday, inc'd next dose. 5. Metabolic bone disease:CorrCa/Phos ok.Continue TumsBID. Not on other binder. 6. Nutrition:Albumin low,continue protein supplements.  [name],  PA-C 08/10/2020, 10:03 AM  [employer]

## 2020-08-10 NOTE — Progress Notes (Signed)
Pt's HR in the 40's with non-sustained dips into the 30's. Pt asleep but easily arousable and asymptomatic. Provider on call notified. Will continue to monitor.

## 2020-08-10 NOTE — TOC Transition Note (Signed)
Transition of Care [facility]) - CM/SW Discharge Note   Patient Details  Name: ADDYLYNN BALIN MRN: [id number] Date of Birth: October 15, 1936  [facility]) CM/SW Contact:  Trula Ore, [place] Phone Number: 08/10/2020, 1:51 PM   Clinical Narrative:     Patient will DC to: [facility]  Anticipated DC date: 08/10/2020  Family notified: Tonya  Transport by: Corey Harold  ?  Per MD patient ready for DC to [facility] . RN, patient, patient's family,renal navigator, and facility notified of DC. Discharge Summary sent to facility. RN given number for report tele#[phone] [id number], ask for nurse on [employer] . DC packet on chart. Ambulance transport requested for patient.  CSW signing off.  Final next level of care: Skilled Nursing Facility Barriers to Discharge: No Barriers Identified   Patient Goals and CMS Choice Patient states their goals for this hospitalization and ongoing recovery are:: to go to SNF   Choice offered to / list presented to : Adult Children Kenney Houseman)  Discharge Placement              Patient chooses bed at: [facility] Patient to be transferred to facility by: [facility] Name of family member notified: Tonya Patient and family notified of of transfer: 08/10/20  Discharge Plan and Services                                     Social Determinants of Health (SDOH) Interventions     Readmission Risk Interventions No flowsheet data found.

## 2020-08-10 NOTE — Procedures (Signed)
I was present at this dialysis session. I have reviewed the session itself and made appropriate changes.   Filed Weights   08/09/20 0640 08/10/20 0404 08/10/20 0737  Weight: 56.7 kg 57 kg 55.3 kg    Recent Labs  Lab 08/10/20 0420  NA 134*  K 4.4  CL 100  CO2 24  GLUCOSE 92  BUN 22  CREATININE 3.29*  CALCIUM 8.2*  PHOS 4.5    Recent Labs  Lab 08/05/20 0918 08/06/20 0802 08/07/20 0335 08/07/20 0335 08/08/20 0629 08/09/20 0439 08/10/20 0420  WBC 7.3   < > 6.6   < > 7.1 6.9 9.0  NEUTROABS 4.9  --  3.3  --  4.1  --   --   HGB 10.1*   < > 10.0*   < > 9.8* 9.5* 9.9*  HCT 33.1*   < > 33.1*   < > 33.0* 31.5* 33.2*  MCV 105.4*   < > 107.5*   < > 107.5* 108.6* 107.4*  PLT 172   < > 174   < > 209 199 220   < > = values in this interval not displayed.    Scheduled Meds: . (feeding supplement) PROSource Plus  30 mL Oral BID BM  . allopurinol  100 mg Oral Daily  . amiodarone  400 mg Oral Daily  . aspirin  81 mg Oral Daily  . azelastine  2 spray Each Nare BID  . bisacodyl  10 mg Rectal Once  . brinzolamide  1 drop Both Eyes BID   Or  . brimonidine  1 drop Both Eyes BID  . calcium carbonate  1 tablet Oral BID  . [START ON 08/13/2020] darbepoetin (ARANESP) injection - DIALYSIS  100 mcg Intravenous Q Mon-HD  . diazepam  5 mg Oral QHS  . diclofenac Sodium  2 g Topical QHS  . digoxin  0.0625 mg Oral Q M,W,F  . dimethicone  1 application Topical BID  . diphenoxylate-atropine  1 tablet Oral Q M,W,F-HD  . fluticasone  2 spray Each Nare Daily  . heparin sodium (porcine)      . ipratropium  2 spray Each Nare Q12H  . loperamide  4 mg Oral Q M,W,F-HD  . loratadine  10 mg Oral Daily  . melatonin  10 mg Oral QHS  . midodrine      . midodrine  10 mg Oral Q M,W,F-HD  . midodrine  5 mg Oral TID WC  . multivitamin  1 tablet Oral QHS  . traZODone  50 mg Oral QHS   Continuous Infusions: PRN Meds:.acetaminophen, cholestyramine, guaiFENesin, metoprolol tartrate, midodrine, ondansetron  (ZOFRAN) IV   Gean Quint, MD [facility] 08/10/2020, 10:03 AM

## 2020-08-10 NOTE — Progress Notes (Signed)
  Pt with nocturnal bradycardia into the upper 30s, which improves on waking.   Will go ahead and decrease amiodarone to 400 mg daily x 1 week then 200 mg daily chronically.   Reviewed telemetry and plan with Dr. Ronni Rumble "[address] [place], [place]  08/10/2020 7:58 AM

## 2020-08-10 NOTE — Plan of Care (Signed)
D/Cc to  ALF, d/c education complete, printed AVS sent with PTAR .

## 2020-08-10 NOTE — Discharge Summary (Signed)
Physician Discharge Summary  KESHONDA MONSOUR OEV:[id number] DOB: 1936/12/10 DOA: 08/03/2020  PCP: Patrick Jupiter  Admit date: 08/03/2020 Discharge date: 08/10/2020  Time spent: 45 minutes  Recommendations for Outpatient Follow-up:  Patient will be discharged to skilled nursing facility, continue physical and occupational therapy. Continue hemodialysis as scheduled.  Patient will need to follow up with primary care provider within one week of discharge.  Follow up with cardiology. Patient should continue medications as prescribed.  Patient should follow a regualr diet.    Discharge Diagnoses:  Paroxysmal atrial fibrillation/flutter with RVR Hypotension End-stage renal disease on hemodialysis Anemia of chronic disease Chronic macrocytosis Chronic diastolic heart failure Diabetes mellitus, type II Stage II sacral ulcer Chronic hypoxic respiratory failure Generalized deconditioning/goals of care  Discharge Condition: Stable  Diet recommendation: regular  Filed Weights   08/09/20 0640 08/10/20 0404 08/10/20 0737  Weight: 56.7 kg 57 kg 55.3 kg    History of present illness:  on 08/03/2020 by Dr. Hughie Closs Coghillis a 84 y.o.femalewith medical history significant ofend-stage renal disease on hemodialysis Mondays Wednesdays and Fridays, GERD, atrial fibrillation, hyperlipidemia, hypertension, diabetes who was at hemodialysis today when she started having chest discomfort and noted to have A. fib with RVR. She was sent to the ER before completing her hemodialysis. Patient was seen and evaluated. She was seen here last night with exactly the same symptoms. At that point cardiology was consulted. Patient was placed on amiodarone and Lopressor was discontinued with. When she came to the ER today she was found to have rates also in the 130s 140s. She was given a dose of Lopressor which controlled her rate now but maintained still on her amiodarone. Patient noted to have  profound hypotension. Systolics remaining in the 80s. She is also weak and debilitated. She is therefore being admitted for observation to manage her hypotension. Nephrology consulted.  Hospital Course:  Paroxysmal atrial fibrillation/flutter with RVR -Patient presented with heart rates of 130s to 140s which required Lopressor in the emergency department -EP has been consulted and patient was started on amiodarone which is now been increased to 400 mg twice daily.  Discussed with EP on 8/19, will continue 400 mg twice daily for 1 week followed by 200 mg twice daily thereafter until she follows up with Dr. Quentin Ore. -She continues to have tachycardia during dialysis along with hypotension -Patient has been started on digoxin  -EP would like to monitor how patient does with dialysis today on digoxin.  She may need leadless pacing.  She would not be a candidate for any approach of transvenous pacing due to LUE occlusions and LUE AV fistula.  -Patient was noted to have asymptomatic bradycardia overnight. -Discussed again with EP 8/20, will decrease her dosage of amiodarone to 400 mg daily for 1 week followed by 200 mg daily thereafter.  Hypotension -BP has noted to be on the lower side -She is currently on midodrine with HD -Have added on midodrine 5 mg 3 times daily every day along with her midodrine 10 mg during dialysis days  End-stage renal disease on hemodialysis -Patient dialyzes on Monday, Wednesday, Friday -Nephrology consulted and appreciated -Patient did tolerate dialysis on 08/08/2020 with digoxin without seen arrhythmia on telemetry monitoring. -Patient dialyzed today, 08/10/2020  Anemia of chronic disease -Stable, continue to monitor CBC  Chronic macrocytosis -Questionable cause.  Vitamin B12 and folate are within normal limits  Chronic diastolic heart failure -Does not appear to be volume overload at this time -Continue volume control with  dialysis -Monitor intake and  output and daily weights  Diabetes mellitus, type II -diet controlled- not on any home medications  Stage II sacral ulcer -Present on admission -Continue wound care  Chronic hypoxic respiratory failure -Patient uses 2 L at baseline -Stable  Generalized deconditioning/goals of care -Patient and daughter are not interested in palliative care discussions -Per previous hospitalist daughter states her mother is hard of hearing and does not understand the concept of these discussions although patient has expressed to multiple providers that she would like to pass away in peace.  Daughter does not want any more discussions regarding this  Code status: Full  Consultants Cardiology/ EP Nephrology  Palliative care  Procedures  None  Discharge Exam:  08/10/20 1030  BP: 105/71  Pulse: (!) 54  Resp:   Temp:   SpO2:     Exam  General: Well developed, chronically ill-appearing, NAD  HEENT: NCAT,  mucous membranes moist.   Cardiovascular: S1 S2 auscultated, RRR, no murmur.  Respiratory: Clear to auscultation bilaterally   Abdomen: Soft, nontender, nondistended, + bowel sounds  Extremities: warm dry without cyanosis clubbing. Trace LE edema  Neuro: AAOx3, hard of hearing, poor historian, otherwise nonfocal  Psych: Pleasant, appropriate mood and affect   Discharge Instructions Discharge Instructions    Discharge instructions   Complete by: As directed    Patient will be discharged to skilled nursing facility, continue physical and occupational therapy. Continue hemodialysis as scheduled.  Patient will need to follow up with primary care provider within one week of discharge.  Follow up with cardiology. Patient should continue medications as prescribed.  Patient should follow a regualr diet.   Discharge wound care:   Complete by: As directed    Keep wound clean and dry     Allergies as of 08/10/2020      Reactions   Erythromycin Swelling   Codeine Palpitations,  Rash, Other (See Comments)   GI Upset   Erythromycin Base Other (See Comments)   GI Upset   Penicillins Rash, Other (See Comments)   Has taken Keflex & Rocephin many times without problems Documented on MAR   Zantac [ranitidine Hcl] Nausea And Vomiting      Medication List    TAKE these medications   acetaminophen 325 MG tablet Commonly known as: TYLENOL Take 650 mg by mouth every 6 (six) hours as needed for fever (greater than 100 F).   allopurinol 100 MG tablet Commonly known as: ZYLOPRIM Take 100 mg by mouth daily.   amiodarone 200 MG tablet Commonly known as: Pacerone Take 2 tablets (400mg ) daily for 7 days. Thereafter take 1 tablet (200mg ) daily. What changed:   how much to take  how to take this  when to take this  additional instructions   aspirin 81 MG chewable tablet Chew 81 mg by mouth daily.   azelastine 0.1 % nasal spray Commonly known as: ASTELIN Place 2 sprays into both nostrils 2 (two) times daily.   Biofreeze 4 % Gel Generic drug: Menthol (Topical Analgesic) Apply 1 application topically 3 (three) times daily as needed (bilateral knee pain).   calcium carbonate 500 MG chewable tablet Commonly known as: TUMS - dosed in mg elemental calcium Chew 1 tablet by mouth 2 (two) times daily.   cetirizine 10 MG tablet Commonly known as: ZYRTEC Take 10 mg by mouth daily.   cholestyramine 4 g packet Commonly known as: Questran Take 1 packet (4 g total) by mouth 2 (two) times daily. Take twice daily  before breakfast and dinner What changed:   when to take this  reasons to take this  additional instructions   diazepam 5 MG tablet Commonly known as: Valium Take 1 tablet (5 mg total) by mouth at bedtime.   diclofenac Sodium 1 % Gel Commonly known as: VOLTAREN Apply 2 g topically See admin instructions. Apply 2 grams to the right knee at bedtime   Digoxin 62.5 MCG Tabs Take 0.0625 mg by mouth every Monday, Wednesday, and Friday.     diphenoxylate-atropine 2.5-0.025 MG tablet Commonly known as: LOMOTIL Take 1 tablet by mouth See admin instructions. Take 1 tablet by mouth prior to dialysis on Mon/Wed/Fri and up to 4 times a day as needed for diarrhea or loose stools   fluticasone 50 MCG/ACT nasal spray Commonly known as: FLONASE Place 2 sprays into both nostrils daily.   ipratropium 0.03 % nasal spray Commonly known as: ATROVENT Place 2 sprays into both nostrils every 12 (twelve) hours.   loperamide 2 MG tablet Commonly known as: IMODIUM A-D Take 2 tablets (4 mg total) by mouth every 8 (eight) hours as needed for diarrhea or loose stools (for diarrhea). What changed:   when to take this  additional instructions   Melatonin 5 MG Caps Take 10 mg by mouth at bedtime.   midodrine 10 MG tablet Commonly known as: PROAMATINE Take 1 tablet (10 mg total) by mouth every Monday, Wednesday, and Friday with hemodialysis. What changed:   medication strength  how much to take  when to take this  additional instructions   midodrine 5 MG tablet Commonly known as: PROAMATINE Take 1 tablet (5 mg total) by mouth 3 (three) times daily with meals. What changed: You were already taking a medication with the same name, and this prescription was added. Make sure you understand how and when to take each.   NEPHRO-VITE PO Take 1 tablet by mouth daily. What changed: Another medication with the same name was removed. Continue taking this medication, and follow the directions you see here.   OXYGEN Inhale 2 L into the lungs continuous.   SALINE NASAL MIST NA Place 2 sprays into the nose 3 (three) times daily.   Secura Dimethicone Protectant 5 % Crea Generic drug: DIMETHICONE (TOPICAL) Apply 1 application topically See admin instructions. Apply a nickel-sized amount onto the buttocks area 2 times a day for barrier protection What changed: Another medication with the same name was removed. Continue taking this medication,  and follow the directions you see here.   Simbrinza 1-0.2 % Susp Generic drug: Brinzolamide-Brimonidine Place 1 drop into both eyes 2 (two) times daily. (0900 & 2100)   traZODone 50 MG tablet Commonly known as: DESYREL Take 50 mg by mouth at bedtime.            Discharge Care Instructions  (From admission, onward)         Start     Ordered   08/10/20 0000  Discharge wound care:       Comments: Keep wound clean and dry   08/10/20 1129         Allergies  Allergen Reactions  . Erythromycin Swelling  . Codeine Palpitations, Rash and Other (See Comments)    GI Upset  . Erythromycin Base Other (See Comments)    GI Upset  . Penicillins Rash and Other (See Comments)    Has taken Keflex & Rocephin many times without problems Documented on MAR  . Zantac [Ranitidine Hcl] Nausea And Vomiting  Follow-up Information    Croitoru, Mihai, MD Follow up in 2 week(s).   Specialty: Cardiology Why: The office will call to arrange follow up in 2 weeks with Dr. Sallyanne Kuster or one of his PAs or NPs.   Contact information: [address] [place] [address] [phone]        Vickie Epley, MD Follow up on 09/11/2020.   Specialty: Cardiology Why: at 220 for post hospital follow up. Contact information: [address] [place] [address] [phone]                The results of significant diagnostics from this hospitalization (including imaging, microbiology, ancillary and laboratory) are listed below for reference.    Significant Diagnostic Studies: DG Chest Port 1 View  Result Date: 08/03/2020 CLINICAL DATA:  Dyspnea, new onset atrial fibrillation EXAM: PORTABLE CHEST 1 VIEW COMPARISON:  08/01/2020 FINDINGS: Small left pleural effusion with associated left basilar compressive atelectasis and retrocardiac opacification is unchanged. Tiny right pleural effusion is suspected, unchanged. Lung volumes are small, but symmetric. No  superimposed focal pulmonary infiltrate. No pneumothorax. Cardiac size within normal limits. No acute bone abnormality. IMPRESSION: 1. Unchanged small left pleural effusion with associated left basilar compressive atelectasis and retrocardiac opacification. 2. Probable tiny right pleural effusion, unchanged. 3. Progressive pulmonary hypoinflation. Electronically Signed   By: Fidela Salisbury MD   On: 08/03/2020 15:36   DG Chest Port 1 View  Result Date: 08/01/2020 CLINICAL DATA:  Short of breath.  Palpitations. EXAM: PORTABLE CHEST 1 VIEW COMPARISON:  05/16/2019 FINDINGS: Midline trachea. Borderline cardiomegaly. Atherosclerosis in the transverse aorta. Small layering left pleural effusion is similar. No pneumothorax. Suspect trace right pleural effusion. Mild pulmonary venous congestion is new or increased. Similar left base airspace disease. IMPRESSION: Mild pulmonary venous congestion, new or increased. Otherwise, similar appearance of left greater than right pleural effusions and left base airspace disease. Electronically Signed   By: Abigail Miyamoto M.D.   On: 08/01/2020 11:56   VAS US DUPLEX DIALYSIS ACCESS (AVF, AVG)  Result Date: 07/19/2020 DIALYSIS ACCESS Reason for Exam: Routine follow up. Access Site: Right Upper Extremity. Access Type: Forearm loop AVG. History: 06/22/20 Venoplasty of brachial vein stenosis          06/08/18 AV Gore-tex graft placement. Performing Technologist: June Leap RDMS, RVT  Examination Guidelines: A complete evaluation includes B-mode imaging, spectral Doppler, color Doppler, and power Doppler as needed of all accessible portions of each vessel. Unilateral testing is considered an integral part of a complete examination. Limited examinations for reoccurring indications may be performed as noted.  Findings:   +--------------------+----------+-----------------+--------+ AVG                 PSV (cm/s)Flow Vol (mL/min)Describe  +--------------------+----------+-----------------+--------+ Native artery inflow   236          1546                +--------------------+----------+-----------------+--------+ Arterial anastomosis   548                              +--------------------+----------+-----------------+--------+ Prox graft             434                              +--------------------+----------+-----------------+--------+ Mid graft  146                              +--------------------+----------+-----------------+--------+ Distal graft           133                              +--------------------+----------+-----------------+--------+ Venous anastomosis     278                              +--------------------+----------+-----------------+--------+ Venous outflow         210                              +--------------------+----------+-----------------+--------+  Summary: Patent arteriovenous graft. *See table(s) above for measurements and observations.  Diagnosing physician: Ruta Hinds MD Electronically signed by Ruta Hinds MD on 07/19/2020 at 12:40:24 PM.   --------------------------------------------------------------------------------   Final    ECHOCARDIOGRAM COMPLETE  Result Date: 08/07/2020    ECHOCARDIOGRAM REPORT   Patient Name:   MULKI ROESLER Kloth Date of Exam: 08/07/2020 Medical Rec #:  [id number]       Height:       65.0 in Accession #:    [id number]      Weight:       132.1 lb Date of Birth:  1936-06-25       BSA:          1.658 m Patient Age:    35 years        BP:           74/58 mmHg Patient Gender: F               HR:           71 bpm. Exam Location:  Inpatient Procedure: 2D Echo, Cardiac Doppler, Color Doppler and Intracardiac            Opacification Agent Indications:    Murmur 785.2 / R01.1  History:        Patient has prior history of Echocardiogram examinations, most                 recent 07/06/2018. CHF, Arrythmias:Atrial Fibrillation,                  Signs/Symptoms:Chest Pain and Hypotension; Risk                 Factors:Hypertension, Diabetes and Non-Smoker.  Sonographer:    Vickie Epley RDCS Referring Phys: [id number] [place]  1. Left ventricular ejection fraction, by estimation, is 70 to 75%. The left ventricle has hyperdynamic function. The left ventricle has no regional wall motion abnormalities. There is moderate left ventricular hypertrophy. Left ventricular diastolic parameters are consistent with Grade II diastolic dysfunction (pseudonormalization). Elevated left atrial pressure.  2. Right ventricular systolic function is normal. The right ventricular size is normal. There is moderately elevated pulmonary artery systolic pressure. The estimated right ventricular systolic pressure is 58.0 mmHg.  3. The mitral valve is abnormal. Severe mitral annular calcification. Trivial mitral valve regurgitation. Mild to moderate mitral stenosis. MG 67mmHg at HR 70 bpm. MVA 1.2 cm^2 by continuity equation  4. The aortic valve is tricuspid. Aortic valve regurgitation is not visualized. Mild to moderate aortic valve sclerosis/calcification is present, without any evidence of  aortic stenosis.  5. The inferior vena cava is normal in size with <50% respiratory variability, suggesting right atrial pressure of 8 mmHg. FINDINGS  Left Ventricle: Left ventricular ejection fraction, by estimation, is 70 to 75%. The left ventricle has hyperdynamic function. The left ventricle has no regional wall motion abnormalities. Definity contrast agent was given IV to delineate the left ventricular endocardial borders. The left ventricular internal cavity size was small. There is moderate left ventricular hypertrophy. Left ventricular diastolic parameters are consistent with Grade II diastolic dysfunction (pseudonormalization). Elevated  left atrial pressure. Right Ventricle: The right ventricular size is normal. No increase in right ventricular wall thickness.  Right ventricular systolic function is normal. There is moderately elevated pulmonary artery systolic pressure. The tricuspid regurgitant velocity is 3.46 m/s, and with an assumed right atrial pressure of 8 mmHg, the estimated right ventricular systolic pressure is 70.1 mmHg. Left Atrium: Left atrial size was normal in size. Right Atrium: Right atrial size was normal in size. Pericardium: There is no evidence of pericardial effusion. Mitral Valve: The mitral valve is abnormal. Severe mitral annular calcification. Trivial mitral valve regurgitation. Mild to moderate mitral valve stenosis. Tricuspid Valve: The tricuspid valve is normal in structure. Tricuspid valve regurgitation is trivial. Aortic Valve: The aortic valve is tricuspid. Aortic valve regurgitation is not visualized. Mild to moderate aortic valve sclerosis/calcification is present, without any evidence of aortic stenosis. Pulmonic Valve: The pulmonic valve was not well visualized. Pulmonic valve regurgitation is not visualized. Aorta: The aortic root is normal in size and structure. Venous: The inferior vena cava is normal in size with less than 50% respiratory variability, suggesting right atrial pressure of 8 mmHg. IAS/Shunts: The interatrial septum was not well visualized.  LEFT VENTRICLE PLAX 2D LVOT diam:     1.70 cm     Diastology LV SV:         57          LV e' lateral:   3.49 cm/s LV SV Index:   35          LV E/e' lateral: 33.0 LVOT Area:     2.27 cm    LV e' medial:    3.24 cm/s                            LV E/e' medial:  35.5  LV Volumes (MOD) LV vol d, MOD A4C: 85.0 ml LV vol s, MOD A4C: 28.1 ml LV SV MOD A4C:     85.0 ml RIGHT VENTRICLE RV S prime:     16.20 cm/s TAPSE (M-mode): 2.3 cm LEFT ATRIUM           Index       RIGHT ATRIUM          Index LA Vol (A2C): 29.6 ml 17.85 ml/m RA Area:     9.82 cm LA Vol (A4C): 54.4 ml 32.81 ml/m RA Volume:   20.90 ml 12.60 ml/m  AORTIC VALVE LVOT Vmax:   123.00 cm/s LVOT Vmean:  84.100 cm/s LVOT VTI:     0.253 m  AORTA Ao Root diam: 2.60 cm MITRAL VALVE                TRICUSPID VALVE MV Area (PHT): 2.26 cm     TR Peak grad:   47.9 mmHg MV Decel Time: 335 msec     TR Vmax:        346.00 cm/s MV E  velocity: 115.00 cm/s MV A velocity: 142.00 cm/s  SHUNTS MV E/A ratio:  0.81         Systemic VTI:  0.25 m                             Systemic Diam: 1.70 cm Oswaldo Milian MD Electronically signed by Oswaldo Milian MD Signature Date/Time: 08/07/2020/3:41:25 PM    Final     Microbiology: Recent Results (from the past 240 hour(s))  SARS Coronavirus 2 by RT PCR (hospital order, performed in [facility] hospital lab) Nasopharyngeal Nasopharyngeal Swab     Status: None   Collection Time: 08/03/20  8:10 PM   Specimen: Nasopharyngeal Swab  Result Value Ref Range Status   SARS Coronavirus 2 NEGATIVE NEGATIVE Final    Comment: (NOTE) SARS-CoV-2 target nucleic acids are NOT DETECTED.  The SARS-CoV-2 RNA is generally detectable in upper and lower respiratory specimens during the acute phase of infection. The lowest concentration of SARS-CoV-2 viral copies this assay can detect is 250 copies / mL. A negative result does not preclude SARS-CoV-2 infection and should not be used as the sole basis for treatment or other patient management decisions.  A negative result may occur with improper specimen collection / handling, submission of specimen other than nasopharyngeal swab, presence of viral mutation(s) within the areas targeted by this assay, and inadequate number of viral copies (<250 copies / mL). A negative result must be combined with clinical observations, patient history, and epidemiological information.  Fact Sheet for Patients:   StrictlyIdeas.no  Fact Sheet for Healthcare Providers: BankingDealers.co.za  This test is not yet approved or  cleared by the [place] FDA and has been authorized for detection and/or diagnosis of  SARS-CoV-2 by FDA under an Emergency Use Authorization (EUA).  This EUA will remain in effect (meaning this test can be used) for the duration of the COVID-19 declaration under Section 564(b)(1) of the Act, 21 U.S.C. section 360bbb-3(b)(1), unless the authorization is terminated or revoked sooner.  Performed at [facility], [place] [address], [place], [place] [address]   SARS Coronavirus 2 by RT PCR (hospital order, performed in [facility] hospital lab) Nasopharyngeal Nasopharyngeal Swab     Status: None   Collection Time: 08/09/20  7:53 AM   Specimen: Nasopharyngeal Swab  Result Value Ref Range Status   SARS Coronavirus 2 NEGATIVE NEGATIVE Final    Comment: (NOTE) SARS-CoV-2 target nucleic acids are NOT DETECTED.  The SARS-CoV-2 RNA is generally detectable in upper and lower respiratory specimens during the acute phase of infection. The lowest concentration of SARS-CoV-2 viral copies this assay can detect is 250 copies / mL. A negative result does not preclude SARS-CoV-2 infection and should not be used as the sole basis for treatment or other patient management decisions.  A negative result may occur with improper specimen collection / handling, submission of specimen other than nasopharyngeal swab, presence of viral mutation(s) within the areas targeted by this assay, and inadequate number of viral copies (<250 copies / mL). A negative result must be combined with clinical observations, patient history, and epidemiological information.  Fact Sheet for Patients:   StrictlyIdeas.no  Fact Sheet for Healthcare Providers: BankingDealers.co.za  This test is not yet approved or  cleared by the [place] FDA and has been authorized for detection and/or diagnosis of SARS-CoV-2 by FDA under an Emergency Use Authorization (EUA).  This EUA will remain in effect (meaning this  test can be used) for the duration of the COVID-19  declaration under Section 564(b)(1) of the Act, 21 U.S.C. section 360bbb-3(b)(1), unless the authorization is terminated or revoked sooner.  Performed at [facility], [place] [address], [place], [place] [address]      Labs: Basic Metabolic Panel: Recent Labs  Lab 08/03/20 1450 08/04/20 0342 08/05/20 3267 08/05/20 1428 08/06/20 0802 08/07/20 0335 08/08/20 0629 08/09/20 0439 08/10/20 0420  NA 138   < >   < >  --  137 136 136 137 134*  K 4.5   < >   < >  --  4.3 4.2 4.5 3.9 4.4  CL 100   < >   < >  --  99 100 101 101 100  CO2 25   < >   < >  --  27 29 28 27 24   GLUCOSE 105*   < >   < >  --  81 93 73 77 92  BUN 21   < >   < >  --  18 12 20 12 22   CREATININE 3.32*   < >   < >  --  2.97* 2.28* 3.19* 2.53* 3.29*  CALCIUM 7.5*   < >   < >  --  8.2* 8.3* 8.3* 8.2* 8.2*  MG 1.9  --   --   --   --   --   --   --   --   PHOS  --   --   --  3.3 4.0  --   --  3.6 4.5   < > = values in this interval not displayed.   Liver Function Tests: Recent Labs  Lab 08/06/20 0802 08/09/20 0439 08/10/20 0420  ALBUMIN 2.4* 2.4* 2.6*   No results for input(s): LIPASE, AMYLASE in the last 168 hours. No results for input(s): AMMONIA in the last 168 hours. CBC: Recent Labs  Lab 08/05/20 0918 08/05/20 0918 08/06/20 0802 08/07/20 0335 08/08/20 0629 08/09/20 0439 08/10/20 0420  WBC 7.3   < > 6.4 6.6 7.1 6.9 9.0  NEUTROABS 4.9  --   --  3.3 4.1  --   --   HGB 10.1*   < > 9.6* 10.0* 9.8* 9.5* 9.9*  HCT 33.1*   < > 32.0* 33.1* 33.0* 31.5* 33.2*  MCV 105.4*   < > 107.0* 107.5* 107.5* 108.6* 107.4*  PLT 172   < > 184 174 209 199 220   < > = values in this interval not displayed.   Cardiac Enzymes: No results for input(s): CKTOTAL, CKMB, CKMBINDEX, TROPONINI in the last 168 hours. BNP: BNP (last 3 results) Recent Labs    08/01/20 1413  BNP 1,092.0*    ProBNP (last 3 results) No results for input(s): PROBNP in the last 8760 hours.  CBG: Recent Labs  Lab 08/08/20 1137  GLUCAP  131*       Signed:  Kayson Tasker  [facility] 08/10/2020, 11:29 AM

## 2020-08-22 NOTE — Progress Notes (Signed)
Cardiology Office Note:    Date:  08/23/2020   ID:  [name] James, DOB [birth year], MRN [id number]  PCP:  Place, [place]  Cardiologist:  Sanda Klein, [place]   Referring MD: Place, [place]   Chief Complaint  Patient presents with   Hospitalization Follow-up    History of Present Illness:    [name] James is a 84 y.o. female with a hx of paroxysmal atrial fibrillation, hypertension, HLD,GERD,  ESRD on HD, anemia of chronic disease, chronic diastolic heart failure, DM, and chronic respiratory failure. She has known aortic and coronary atherosclerosis. She had a normal nuclear stress test and normal EF in 2015.  COVID-19 infection 04/2019. She has a history of tachy-brady but is not a candidate for PPM placement. She is not anticoagulated due to GIB.   She was recently hospitalized after presenting to HD with chest discomfort and found to be in Afib RVR. Rates in the 130-140s in the ER, given lopressor for rate control which resulted in hypotension.  Amiodarone was initiated and lopressor D/C'ed. She continued to have RVR and hypotension during HD and EP started her on digoxin.   She returns today for follow up. EKG with regular rhythm but no clear p waves with artifact. She complains of significant fatigue since being out of the hospital . Occasional chest twinges with palpitations, but has largely resolved since discharge. Breathing is at baseline, on O2. She struggles with getting enough calories and protein. We reviewed options for diet. Difficult situation given facility food. She losing weight.    Past Medical History:  Diagnosis Date   Anemia, chronic disease    Anxiety    Arthritis    knees   Atrial fibrillation (HCC)    Barrett esophagus    Benign paroxysmal positional vertigo    Breast cancer ([place]) 1980s   bil mastectomies, no radiation or chemo   Cataract    Chest pain, atypical 12/05/2008   R/Lmv- normal perfusion all regions, noe ECG changes    CHF (congestive  heart failure) ([place]) 05/13/2011   echo - CX>44%; stage 1 diastolic dysfunction; elevated LV filling pressure, MAC   CKD (chronic kidney disease), stage III    Claudication ([place]) 10/30/2005   doppler - normal evaluation, no evidence of aneurysm, diameter reduction, dissection, compression or vascular abnormality)   Complication of anesthesia    Controlled type 2 diabetes mellitus with diabetic polyneuropathy, with long-term current use of insulin ([place]) 04/17/2016   Dyslipidemia    Dysrhythmia    Atrial Fibrillation   Edema    ESRD (end stage renal disease) on dialysis ([place])    "[facility]; [address]; MWF" (07/06/2018)   Family history of adverse reaction to anesthesia    son also has nausea    Fever blister 12/2016   Gastritis and gastroduodenitis    GERD (gastroesophageal reflux disease)    Gout    Hemodialysis-associated hypotension    HLD (hyperlipidemia)    Hypertension    IBS (irritable bowel syndrome)    Insomnia    Iron deficiency anemia    Junctional bradycardia 08/25/2018   LFT elevation    Long term current use of amiodarone 10/17/2016   Morbid (severe) obesity due to excess calories ([place]) 04/29/2017   Morbid obesity ([place])    On home oxygen therapy    "2L; 24/7" (07/06/2018)   PAF (paroxysmal atrial fibrillation) (HCC)    Peripheral neuropathy    Pneumonia 12/2016   PONV (postoperative nausea and vomiting)  Renal cyst    Renal insufficiency 10/30/2005   doppler - abn resistance consistent w/ parenchymal disease   Renovascular hypertension    Type 2 diabetes mellitus with diabetic neuropathy, with long-term current use of insulin ([place])    Vertigo     Past Surgical History:  Procedure Laterality Date   A/V FISTULAGRAM Left 03/19/2018   Procedure: A/V FISTULAGRAM;  Surgeon: Angelia Mould, MD;  Location: [place] [facility];  Service: Cardiovascular;  Laterality: Left;   A/V FISTULAGRAM N/A 12/24/2018   Procedure: A/V FISTULAGRAM -  Right Arm;  Surgeon: Angelia Mould, MD;  Location: [place] [facility];  Service: Cardiovascular;  Laterality: N/A;   A/V FISTULAGRAM Right 03/29/2019   Procedure: A/V FISTULAGRAM;  Surgeon: Angelia Mould, MD;  Location: [place] [facility];  Service: Cardiovascular;  Laterality: Right;   A/V FISTULAGRAM Right 02/03/2020   Procedure: A/V FISTULAGRAM;  Surgeon: Angelia Mould, MD;  Location: [place];  Service: Cardiovascular;  Laterality: Right;   A/V SHUNTOGRAM Left 01/19/2018   Procedure: A/V SHUNTOGRAM;  Surgeon: Serafina Mitchell, MD;  Location: [place] [facility];  Service: Cardiovascular;  Laterality: Left;   ANGIOPLASTY Left 02/12/2015   Procedure: ANGIOPLASTY;  Surgeon: Angelia Mould, MD;  Location: [facility] CATH LAB;  Service: Cardiovascular;  Laterality: Left;  AVF   AV FISTULA PLACEMENT Left 11/30/2014   Procedure: ARTERIOVENOUS (AV) FISTULA CREATION LEFT ARM;  Surgeon: Angelia Mould, MD;  Location: [place];  Service: Vascular;  Laterality: Left;   AV FISTULA PLACEMENT Left 03/20/2015   Procedure:  Inserton of Left Upper Arm Gortex Graft;  Surgeon: Angelia Mould, MD;  Location: [place];  Service: Vascular;  Laterality: Left;   AV FISTULA PLACEMENT Left 04/12/2015   Procedure: INSERTION OF LEFT ARM  ARTERIOVENOUS GORE-TEX GRAFT ;  Surgeon: Angelia Mould, MD;  Location: [place];  Service: Vascular;  Laterality: Left;   AV FISTULA PLACEMENT Right 06/08/2018   Procedure: INSERTION OF ARTERIOVENOUS (AV) GORE-TEX GRAFT ARM USING 4-7MM X 45CM GORETEX GRAFT;  Surgeon: Angelia Mould, MD;  Location: [place];  Service: Vascular;  Laterality: Right;   BACK SURGERY  ~1980   CATARACT EXTRACTION Right    CHOLECYSTECTOMY     ESOPHAGOGASTRODUODENOSCOPY N/A 10/24/2014   Procedure: ESOPHAGOGASTRODUODENOSCOPY (EGD);  Surgeon: Jerene Bears, MD;  Location[facility] ENDOSCOPY;  Service: Endoscopy;  Laterality: N/A;   EXCHANGE OF A DIALYSIS CATHETER  Left 11/30/2014   Procedure: EXCHANGE OF A DIALYSIS CATHETER, LEFT INTERNAL JUGULAR;  Surgeon: Angelia Mould, MD;  Location: [facility];  Service: Vascular;  Laterality: Left;   EYE SURGERY Left    transplant   FISTULOGRAM N/A 02/12/2015   Procedure: FISTULOGRAM;  Surgeon: Angelia Mould, MD;  Location: [facility] CATH LAB;  Service: Cardiovascular;  Laterality: N/A;   HEMORRHOID SURGERY     IR AV DIALY SHUNT INTRO NEEDLE/INTRACATH INITIAL W/PTA/IMG RIGHT Right 08/25/2018   IR AV DIALY SHUNT INTRO NEEDLE/INTRACATH INITIAL W/PTA/IMG RIGHT Right 11/17/2018   IR US GUIDE VASC ACCESS RIGHT  08/25/2018   LIGATION ARTERIOVENOUS GORTEX GRAFT Left 08/27/2018   Procedure: LIGATION ARTERIOVENOUS GORTEX GRAFT ARM;  Surgeon: Angelia Mould, MD;  Location: [place];  Service: Vascular;  Laterality: Left;   LIGATION OF ARTERIOVENOUS  FISTULA Left 03/20/2015   Procedure: LIGATION OF ARTERIOVENOUS  FISTULA;  Surgeon: Angelia Mould, MD;  Location: [place];  Service: Vascular;  Laterality: Left;   MASTECTOMY Bilateral [phone]  bil breast reconstruction with implants   ORIF TIBIA & FIBULA FRACTURES Left 2007   also had left non displaced malleolar fracture.    PERIPHERAL VASCULAR BALLOON ANGIOPLASTY Left 01/19/2018   Procedure: PERIPHERAL VASCULAR BALLOON ANGIOPLASTY;  Surgeon: Serafina Mitchell, MD;  Location: [place] [facility];  Service: Cardiovascular;  Laterality: Left;  fistula   PERIPHERAL VASCULAR BALLOON ANGIOPLASTY Left 03/19/2018   Procedure: PERIPHERAL VASCULAR BALLOON ANGIOPLASTY;  Surgeon: Angelia Mould, MD;  Location: [place] [facility];  Service: Cardiovascular;  Laterality: Left;  UPPER ARM FISTULA   PERIPHERAL VASCULAR BALLOON ANGIOPLASTY Right 12/24/2018   Procedure: PERIPHERAL VASCULAR BALLOON ANGIOPLASTY;  Surgeon: Angelia Mould, MD;  Location: [place] [facility];  Service: Cardiovascular;  Laterality: Right;  arm fistula   PERIPHERAL VASCULAR BALLOON  ANGIOPLASTY  03/29/2019   Procedure: PERIPHERAL VASCULAR BALLOON ANGIOPLASTY;  Surgeon: Angelia Mould, MD;  Location: [place] [facility];  Service: Cardiovascular;;   PERIPHERAL VASCULAR BALLOON ANGIOPLASTY Right 02/03/2020   Procedure: PERIPHERAL VASCULAR BALLOON ANGIOPLASTY;  Surgeon: Angelia Mould, MD;  Location: [place] [facility];  Service: Cardiovascular;  Laterality: Right;  upper arm fistula   PERIPHERAL VASCULAR BALLOON ANGIOPLASTY  06/22/2020   Procedure: PERIPHERAL VASCULAR BALLOON ANGIOPLASTY;  Surgeon: Angelia Mould, MD;  Location: [place] [facility];  Service: Cardiovascular;;  Right AVF   REMOVAL OF GRAFT Left 04/12/2015   Procedure: REMOVAL OF LEFT ARM ARTERIOVENOUS GORE-TEX GRAFT;  Surgeon: Angelia Mould, MD;  Location: [place];  Service: Vascular;  Laterality: Left;   REVISION OF ARTERIOVENOUS GORETEX GRAFT Right 12/13/2019   Procedure: REVISION OF ARTERIOVENOUS GORETEX GRAFT RIGHT FOREARM;  Surgeon: Angelia Mould, MD;  Location: [facility] OR;  Service: Vascular;  Laterality: Right;    Current Medications: Current Meds  Medication Sig   allopurinol (ZYLOPRIM) 100 MG tablet Take 100 mg by mouth daily.    amiodarone (PACERONE) 200 MG tablet Take 2 tablets (467m) daily for 7 days. Thereafter take 1 tablet (2033m daily.   aspirin 81 MG chewable tablet Chew 81 mg by mouth daily.    azelastine (ASTELIN) 0.1 % nasal spray Place 2 sprays into both nostrils 2 (two) times daily.   B Complex-C-Folic Acid (NEPHRO-VITE PO) Take 1 tablet by mouth daily.   Brinzolamide-Brimonidine (SIMBRINZA) 1-0.2 % SUSP Place 1 drop into both eyes 2 (two) times daily. (0900 & 2100)   calcium carbonate (TUMS - DOSED IN MG ELEMENTAL CALCIUM) 500 MG chewable tablet Chew 1 tablet by mouth 2 (two) times daily.   cetirizine (ZYRTEC) 10 MG tablet Take 10 mg by mouth daily.   cholestyramine (QUESTRAN) 4 g packet Take 1 packet (4 g total) by mouth 2 (two) times daily.  Take twice daily before breakfast and dinner (Patient taking differently: Take 4 g by mouth 2 (two) times daily as needed (removal of bile acids.). )   diazepam (VALIUM) 5 MG tablet Take 1 tablet (5 mg total) by mouth at bedtime.   diclofenac Sodium (VOLTAREN) 1 % GEL Apply 2 g topically See admin instructions. Apply 2 grams to the right knee at bedtime   digoxin 62.5 MCG TABS Take 0.0625 mg by mouth every Monday, Wednesday, and Friday.   DIMETHICONE, TOPICAL, (SECURA DIMETHICONE PROTECTANT) 5 % CREA Apply 1 application topically See admin instructions. Apply a nickel-sized amount onto the buttocks area 2 times a day for barrier protection   diphenoxylate-atropine (LOMOTIL) 2.5-0.025 MG tablet Take 1 tablet by mouth See admin instructions. Take 1  tablet by mouth prior to dialysis on Mon/Wed/Fri and up to 4 times a day as needed for diarrhea or loose stools   fluticasone (FLONASE) 50 MCG/ACT nasal spray Place 2 sprays into both nostrils daily.    ipratropium (ATROVENT) 0.03 % nasal spray Place 2 sprays into both nostrils every 12 (twelve) hours.   loperamide (IMODIUM A-D) 2 MG tablet Take 2 tablets (4 mg total) by mouth every 8 (eight) hours as needed for diarrhea or loose stools (for diarrhea). (Patient taking differently: Take 4 mg by mouth See admin instructions. Take 4 mg by mouth on Mon/Wed/Fri prior to dialysis- may refuse, if not not needed AND every eight hours as needed for diarrhea or loose stools)   Melatonin 5 MG CAPS Take 10 mg by mouth at bedtime.    Menthol, Topical Analgesic, (BIOFREEZE) 4 % GEL Apply 1 application topically 3 (three) times daily as needed (bilateral knee pain).   midodrine (PROAMATINE) 10 MG tablet Take 1 tablet (10 mg total) by mouth every Monday, Wednesday, and Friday with hemodialysis.   midodrine (PROAMATINE) 5 MG tablet Take 1 tablet (5 mg total) by mouth 3 (three) times daily with meals.   OXYGEN Inhale 2 L into the lungs continuous.    SALINE NASAL  MIST NA Place 2 sprays into the nose 3 (three) times daily.   traZODone (DESYREL) 50 MG tablet Take 50 mg by mouth at bedtime.      Allergies:   Erythromycin, Codeine, Erythromycin base, Penicillins, and Zantac [ranitidine hcl]   Social History   Socioeconomic History   Marital status: Divorced    Spouse name: Not on file   Number of children: Not on file   Years of education: Not on file   Highest education level: Not on file  Occupational History   Not on file  Tobacco Use   Smoking status: Never Smoker   Smokeless tobacco: Never Used  Vaping Use   Vaping Use: Never used  Substance and Sexual Activity   Alcohol use: No   Drug use: No   Sexual activity: Not on file  Other Topics Concern   Not on file  Social History Narrative   [facility] (10/06/17):   Originally from [facility]. Has always lived in [place]. She currently lives in [place]. No bird exposure. [place] Previously traveled to [place], [place], & [place]. Previously worked in a nursery and also a Pharmacist, community.    Social Determinants of Health   Financial Resource Strain:    Difficulty of Paying Living Expenses: Not on file  Food Insecurity:    Worried About Charity fundraiser in the Last Year: Not on file   [employer] of Food in the Last Year: Not on file  Transportation Needs:    Lack of Transportation (Medical): Not on file   Lack of Transportation (Non-Medical): Not on file  Physical Activity:    Days of Exercise per Week: Not on file   Minutes of Exercise per Session: Not on file  Stress:    Feeling of Stress : Not on file  Social Connections:    Frequency of Communication with Friends and Family: Not on file   Frequency of Social Gatherings with Friends and Family: Not on file   Attends Religious Services: Not on file   Active Member of Clubs or Organizations: Not on file   Attends Archivist Meetings: Not on file   Marital Status: Not on file  Family  History: The patient's family history includes Diabetes in her mother; Heart attack in her father; Heart disease in her brother and mother; Lung disease in her brother; Multiple myeloma in her sister; Non-Hodgkin's lymphoma in her mother; Parkinson's disease in her brother; Stroke in her father.  ROS:   Please see the history of present illness.     All other systems reviewed and are negative.  EKGs/Labs/Other Studies Reviewed:    The following studies were reviewed today:  Echo 08/07/20: 1. Left ventricular ejection fraction, by estimation, is 70 to 75%. The  left ventricle has hyperdynamic function. The left ventricle has no  regional wall motion abnormalities. There is moderate left ventricular  hypertrophy. Left ventricular diastolic  parameters are consistent with Grade II diastolic dysfunction  (pseudonormalization). Elevated left atrial pressure.  2. Right ventricular systolic function is normal. The right ventricular  size is normal. There is moderately elevated pulmonary artery systolic  pressure. The estimated right ventricular systolic pressure is 53.6 mmHg.  3. The mitral valve is abnormal. Severe mitral annular calcification.  Trivial mitral valve regurgitation. Mild to moderate mitral stenosis. MG  75mHg at HR 70 bpm. MVA 1.2 cm^2 by continuity equation  4. The aortic valve is tricuspid. Aortic valve regurgitation is not  visualized. Mild to moderate aortic valve sclerosis/calcification is  present, without any evidence of aortic stenosis.  5. The inferior vena cava is normal in size with <50% respiratory  variability, suggesting right atrial pressure of 8 mmHg.   EKG:  EKG is ordered today.  The ekg ordered today demonstrates regular rhythm with artifact, but no clear p waves, suspect junctional rhythm HR 63  Recent Labs: 08/01/2020: ALT 13; B Natriuretic Peptide 1,092.0; TSH 0.947 08/03/2020: Magnesium 1.9 08/10/2020: BUN 22; Creatinine, Ser 3.29; Hemoglobin 9.9;  Platelets 220; Potassium 4.4; Sodium 134  Recent Lipid Panel    Component Value Date/Time   CHOL 157 08/24/2018 0423   TRIG 147 08/24/2018 0423   HDL 32 (L) 08/24/2018 0423   CHOLHDL 4.9 08/24/2018 0423   VLDL 29 08/24/2018 0423   LDLCALC 96 08/24/2018 0423    Physical Exam:    VS:  BP (!) 164/54    Pulse 63    Ht 5' 5" (1.651 m)    Wt 116 lb 13.5 oz (53 kg)    BMI 19.44 kg/m     Wt Readings from Last 3 Encounters:  08/23/20 116 lb 13.5 oz (53 kg)  08/10/20 121 lb 14.6 oz (55.3 kg)  06/22/20 124 lb (56.2 kg)     GEN: elderly, frail, thin female in NAD HEENT: Normal NECK: No JVD; No carotid bruits LYMPHATICS: No lymphadenopathy CARDIAC: RRR, no murmurs, rubs, gallops RESPIRATORY:  Clear to auscultation without rales, wheezing or rhonchi, on O2 ABDOMEN: Soft, non-tender, non-distended MUSCULOSKELETAL:  No edema; No deformity  SKIN: Warm and dry NEUROLOGIC:  Alert and oriented x 3 PSYCHIATRIC:  Normal affect   ASSESSMENT:    1. Paroxysmal atrial fibrillation (HCC)   2. SSS (sick sinus syndrome) ([place]   3. ESRD on dialysis ([place]   4. Aortic atherosclerosis ([place]   5. Atherosclerosis of coronary artery of native heart without angina pectoris, unspecified vessel or lesion type   6. Chronic respiratory failure with hypoxia (HCC)    PLAN:    In order of problems listed above:  PAF Tachy-brady syndrome - continue amiodarone, digoxin - BB D/C'ed during last hospitalization - she has limited options if pacing is required - no PPM  candidate, not a candidate for sternotomy  - no anticoagulation given hx of GIB - digoxin levels drawn at HD - has done well at HD, has not been sent back to the ER - palpitations largely resolved - she is having some nausea with amiodarone, controlled with phenergan (having trouble getting [facility] to give it)  Digoxin: - digoxin 0.625 MWF - dig level due, but needs to be drawn right before HD or 2 hrs after HD - level needs to be  0.6-1.2  - will draw today, per clinical pharmacist   Hypotension during HD - on midodrine - pressure elevated here - no medication changes   Aortic atherosclerosis Coronary atherosclerosis - normal myoview in 2015 - LDL < 100 without medications - continue ASA  - chest twinges have improved since discharge   Chronic respiratory failure - on home O2 - breathing at baseline   FTT - she struggles with calories and protein - daughter is very involved and is trying to increase nutrition   Keep follow up with EP.  Follow up with Dr. Sallyanne Kuster in 2-3 months.   Medication Adjustments/Labs and Tests Ordered: Current medicines are reviewed at length with the patient today.  Concerns regarding medicines are outlined above.  No orders of the defined types were placed in this encounter.  No orders of the defined types were placed in this encounter.   Signed, Ledora Bottcher, [place]  08/23/2020 3:47 PM    [facility]

## 2020-08-23 ENCOUNTER — Encounter: Payer: Self-pay | Admitting: Physician Assistant

## 2020-08-23 ENCOUNTER — Ambulatory Visit (INDEPENDENT_AMBULATORY_CARE_PROVIDER_SITE_OTHER): Payer: Medicare Other | Admitting: Physician Assistant

## 2020-08-23 ENCOUNTER — Other Ambulatory Visit: Payer: Self-pay

## 2020-08-23 VITALS — BP 164/54 | HR 63 | Ht 65.0 in | Wt 116.8 lb

## 2020-08-23 DIAGNOSIS — J9611 Chronic respiratory failure with hypoxia: Secondary | ICD-10-CM

## 2020-08-23 DIAGNOSIS — N186 End stage renal disease: Secondary | ICD-10-CM | POA: Diagnosis not present

## 2020-08-23 DIAGNOSIS — I495 Sick sinus syndrome: Secondary | ICD-10-CM | POA: Diagnosis not present

## 2020-08-23 DIAGNOSIS — I2510 Atherosclerotic heart disease of native coronary artery without angina pectoris: Secondary | ICD-10-CM

## 2020-08-23 DIAGNOSIS — Z992 Dependence on renal dialysis: Secondary | ICD-10-CM

## 2020-08-23 DIAGNOSIS — I700 Atherosclerosis of aorta: Secondary | ICD-10-CM | POA: Diagnosis not present

## 2020-08-23 DIAGNOSIS — I480 Paroxysmal atrial fibrillation: Secondary | ICD-10-CM

## 2020-08-23 NOTE — Progress Notes (Signed)
Thanks. Very tough situation.

## 2020-08-23 NOTE — Patient Instructions (Signed)
Medication Instructions:  Your physician recommends that you continue on your current medications as directed. Please refer to the Current Medication list given to you today.  *If you need a refill on your cardiac medications before your next appointment, please call your pharmacy*   Lab Work: Your physician recommends that you return for lab work today: Digoxin level  If you have labs (blood work) drawn today and your tests are completely normal, you will receive your results only by:  [place] (if you have MyChart) OR  A paper copy in the mail If you have any lab test that is abnormal or we need to change your treatment, we will call you to review the results.   Follow-Up: At [facility], you and your health needs are our priority.  As part of our continuing mission to provide you with exceptional heart care, we have created designated Provider Care Teams.  These Care Teams include your primary Cardiologist (physician) and Advanced Practice Providers (APPs -  Physician Assistants and Nurse Practitioners) who all work together to provide you with the care you need, when you need it.  We recommend signing up for the patient portal called "MyChart".  Sign up information is provided on this After Visit Summary.  MyChart is used to connect with patients for Virtual Visits (Telemedicine).  Patients are able to view lab/test results, encounter notes, upcoming appointments, etc.  Non-urgent messages can be sent to your provider as well.   To learn more about what you can do with MyChart, go to [email or web address].    Your next appointment:   2-3 month(s)  The format for your next appointment:   In Person  Provider:   You may see Sanda Klein, [place] or one of the following Advanced Practice Providers on your designated Care Team:    Fabian Sharp, [place] (When Dr. Loletha Grayer is in the office, however patient prefers to see Dr. Loletha Grayer if possible)    ---Please keep your visit with our  Electrophysiologist at our [address] office.

## 2020-08-24 LAB — DIGOXIN LEVEL: Digoxin, Serum: 1.7 ng/mL — ABNORMAL HIGH (ref 0.5–0.9)

## 2020-09-11 ENCOUNTER — Ambulatory Visit (INDEPENDENT_AMBULATORY_CARE_PROVIDER_SITE_OTHER): Payer: Medicare Other | Admitting: Cardiology

## 2020-09-11 ENCOUNTER — Encounter: Payer: Self-pay | Admitting: Cardiology

## 2020-09-11 ENCOUNTER — Other Ambulatory Visit: Payer: Self-pay

## 2020-09-11 VITALS — BP 114/66 | HR 57 | Ht 65.0 in | Wt 116.0 lb

## 2020-09-11 DIAGNOSIS — I495 Sick sinus syndrome: Secondary | ICD-10-CM

## 2020-09-11 NOTE — Progress Notes (Signed)
Electrophysiology Office Note:    Date:  09/11/2020   ID:  [name] James, DOB 01-May-1936, MRN [id number]  PCP:  [facility] Cardiologist:  Sanda Klein, MD  [facility] Electrophysiologist:  None   Referring MD: Place, [place]   Chief Complaint: hospital follow up, tachy-brady  History of Present Illness:    [name] James is a 84 y.o. female who presents for follow up regarding her tachy-brady syndrome. She was hospitalized in August. Hospitalization was complicated by paroxysms of SVT that limited HD sessions. She was started on digoxin and amiodarone.  She has done well since discharge without episodes of SVT during HD. Her digoxin was stopped due to an elevated digoxin level at a clinic appointment on 08/23/2020.   Past Medical History:  Diagnosis Date   Anemia, chronic disease    Anxiety    Arthritis    knees   Atrial fibrillation (HCC)    Barrett esophagus    Benign paroxysmal positional vertigo    Breast cancer ([place]) 1980s   bil mastectomies, no radiation or chemo   Cataract    Chest pain, atypical 12/05/2008   R/Lmv- normal perfusion all regions, noe ECG changes    CHF (congestive heart failure) ([place]) 05/13/2011   echo - AO>13%; stage 1 diastolic dysfunction; elevated LV filling pressure, MAC   CKD (chronic kidney disease), stage III    Claudication ([place]) 10/30/2005   doppler - normal evaluation, no evidence of aneurysm, diameter reduction, dissection, compression or vascular abnormality)   Complication of anesthesia    Controlled type 2 diabetes mellitus with diabetic polyneuropathy, with long-term current use of insulin ([place]) 04/17/2016   Dyslipidemia    Dysrhythmia    Atrial Fibrillation   Edema    ESRD (end stage renal disease) on dialysis ([place])    "[facility]; [address]; MWF" (07/06/2018)   Family history of adverse reaction to anesthesia    son also has nausea    Fever blister 12/2016   Gastritis and gastroduodenitis     GERD (gastroesophageal reflux disease)    Gout    Hemodialysis-associated hypotension    HLD (hyperlipidemia)    Hypertension    IBS (irritable bowel syndrome)    Insomnia    Iron deficiency anemia    Junctional bradycardia 08/25/2018   LFT elevation    Long term current use of amiodarone 10/17/2016   Morbid (severe) obesity due to excess calories ([place]) 04/29/2017   Morbid obesity ([place])    On home oxygen therapy    "2L; 24/7" (07/06/2018)   PAF (paroxysmal atrial fibrillation) (HCC)    Peripheral neuropathy    Pneumonia 12/2016   PONV (postoperative nausea and vomiting)    Renal cyst    Renal insufficiency 10/30/2005   doppler - abn resistance consistent w/ parenchymal disease   Renovascular hypertension    Type 2 diabetes mellitus with diabetic neuropathy, with long-term current use of insulin ([place])    Vertigo     Past Surgical History:  Procedure Laterality Date   A/V FISTULAGRAM Left 03/19/2018   Procedure: A/V FISTULAGRAM;  Surgeon: Angelia Mould, MD;  Location: [place] [facility];  Service: Cardiovascular;  Laterality: Left;   A/V FISTULAGRAM N/A 12/24/2018   Procedure: A/V FISTULAGRAM - Right Arm;  Surgeon: Angelia Mould, MD;  Location: [place] [facility];  Service: Cardiovascular;  Laterality: N/A;   A/V FISTULAGRAM Right 03/29/2019   Procedure: A/V FISTULAGRAM;  Surgeon: Angelia Mould, MD;  Location: [facility]  INVASIVE CV LAB;  Service: Cardiovascular;  Laterality: Right;   A/V FISTULAGRAM Right 02/03/2020   Procedure: A/V FISTULAGRAM;  Surgeon: Angelia Mould, MD;  Location: [place] [facility];  Service: Cardiovascular;  Laterality: Right;   A/V SHUNTOGRAM Left 01/19/2018   Procedure: A/V SHUNTOGRAM;  Surgeon: Serafina Mitchell, MD;  Location: [place] [facility];  Service: Cardiovascular;  Laterality: Left;   ANGIOPLASTY Left 02/12/2015   Procedure: ANGIOPLASTY;  Surgeon: Angelia Mould, MD;  Location: [facility] CATH LAB;   Service: Cardiovascular;  Laterality: Left;  AVF   AV FISTULA PLACEMENT Left 11/30/2014   Procedure: ARTERIOVENOUS (AV) FISTULA CREATION LEFT ARM;  Surgeon: Angelia Mould, MD;  Location: [place];  Service: Vascular;  Laterality: Left;   AV FISTULA PLACEMENT Left 03/20/2015   Procedure:  Inserton of Left Upper Arm Gortex Graft;  Surgeon: Angelia Mould, MD;  Location: [place];  Service: Vascular;  Laterality: Left;   AV FISTULA PLACEMENT Left 04/12/2015   Procedure: INSERTION OF LEFT ARM  ARTERIOVENOUS GORE-TEX GRAFT ;  Surgeon: Angelia Mould, MD;  Location: [place];  Service: Vascular;  Laterality: Left;   AV FISTULA PLACEMENT Right 06/08/2018   Procedure: INSERTION OF ARTERIOVENOUS (AV) GORE-TEX GRAFT ARM USING 4-7MM X 45CM GORETEX GRAFT;  Surgeon: Angelia Mould, MD;  Location: [place];  Service: Vascular;  Laterality: Right;   BACK SURGERY  ~1980   CATARACT EXTRACTION Right    CHOLECYSTECTOMY     ESOPHAGOGASTRODUODENOSCOPY N/A 10/24/2014   Procedure: ESOPHAGOGASTRODUODENOSCOPY (EGD);  Surgeon: Jerene Bears, MD;  Location: [facility] ENDOSCOPY;  Service: Endoscopy;  Laterality: N/A;   EXCHANGE OF A DIALYSIS CATHETER Left 11/30/2014   Procedure: EXCHANGE OF A DIALYSIS CATHETER, LEFT INTERNAL JUGULAR;  Surgeon: Angelia Mould, MD;  Location: [place];  Service: Vascular;  Laterality: Left;   EYE SURGERY Left    transplant   FISTULOGRAM N/A 02/12/2015   Procedure: FISTULOGRAM;  Surgeon: Angelia Mould, MD;  Location: [facility] CATH LAB;  Service: Cardiovascular;  Laterality: N/A;   HEMORRHOID SURGERY     IR AV DIALY SHUNT INTRO NEEDLE/INTRACATH INITIAL W/PTA/IMG RIGHT Right 08/25/2018   IR AV DIALY SHUNT INTRO NEEDLE/INTRACATH INITIAL W/PTA/IMG RIGHT Right 11/17/2018   IR US GUIDE VASC ACCESS RIGHT  08/25/2018   LIGATION ARTERIOVENOUS GORTEX GRAFT Left 08/27/2018   Procedure: LIGATION ARTERIOVENOUS GORTEX GRAFT ARM;  Surgeon: Angelia Mould, MD;  Location: [place];  Service: Vascular;  Laterality: Left;   LIGATION OF ARTERIOVENOUS  FISTULA Left 03/20/2015   Procedure: LIGATION OF ARTERIOVENOUS  FISTULA;  Surgeon: Angelia Mould, MD;  Location: [place];  Service: Vascular;  Laterality: Left;   MASTECTOMY Bilateral ~1982   bil breast reconstruction with implants   ORIF TIBIA & FIBULA FRACTURES Left 2007   also had left non displaced malleolar fracture.    PERIPHERAL VASCULAR BALLOON ANGIOPLASTY Left 01/19/2018   Procedure: PERIPHERAL VASCULAR BALLOON ANGIOPLASTY;  Surgeon: Serafina Mitchell, MD;  Location: [place] [facility];  Service: Cardiovascular;  Laterality: Left;  fistula   PERIPHERAL VASCULAR BALLOON ANGIOPLASTY Left 03/19/2018   Procedure: PERIPHERAL VASCULAR BALLOON ANGIOPLASTY;  Surgeon: Angelia Mould, MD;  Location: [place] [facility];  Service: Cardiovascular;  Laterality: Left;  UPPER ARM FISTULA   PERIPHERAL VASCULAR BALLOON ANGIOPLASTY Right 12/24/2018   Procedure: PERIPHERAL VASCULAR BALLOON ANGIOPLASTY;  Surgeon: Angelia Mould, MD;  Location: [place] [facility];  Service: Cardiovascular;  Laterality: Right;  arm fistula   PERIPHERAL  VASCULAR BALLOON ANGIOPLASTY  03/29/2019   Procedure: PERIPHERAL VASCULAR BALLOON ANGIOPLASTY;  Surgeon: Angelia Mould, MD;  Location: [place] [facility];  Service: Cardiovascular;;   PERIPHERAL VASCULAR BALLOON ANGIOPLASTY Right 02/03/2020   Procedure: PERIPHERAL VASCULAR BALLOON ANGIOPLASTY;  Surgeon: Angelia Mould, MD;  Location: [place] [facility];  Service: Cardiovascular;  Laterality: Right;  upper arm fistula   PERIPHERAL VASCULAR BALLOON ANGIOPLASTY  06/22/2020   Procedure: PERIPHERAL VASCULAR BALLOON ANGIOPLASTY;  Surgeon: Angelia Mould, MD;  Location: [place] [facility];  Service: Cardiovascular;;  Right AVF   REMOVAL OF GRAFT Left 04/12/2015   Procedure: REMOVAL OF LEFT ARM ARTERIOVENOUS GORE-TEX GRAFT;  Surgeon: Angelia Mould, MD;  Location:  [place];  Service: Vascular;  Laterality: Left;   REVISION OF ARTERIOVENOUS GORETEX GRAFT Right 12/13/2019   Procedure: REVISION OF ARTERIOVENOUS GORETEX GRAFT RIGHT FOREARM;  Surgeon: Angelia Mould, MD;  Location: [facility] OR;  Service: Vascular;  Laterality: Right;    Current Medications: Current Meds  Medication Sig   acetaminophen (TYLENOL) 325 MG tablet Take 650 mg by mouth every 6 (six) hours as needed for fever (greater than 100 F).    allopurinol (ZYLOPRIM) 100 MG tablet Take 100 mg by mouth daily.    amiodarone (PACERONE) 200 MG tablet Take 2 tablets (465m) daily for 7 days. Thereafter take 1 tablet (2058m daily.   aspirin 81 MG chewable tablet Chew 81 mg by mouth daily.    azelastine (ASTELIN) 0.1 % nasal spray Place 2 sprays into both nostrils 2 (two) times daily.   B Complex-C-Folic Acid (NEPHRO-VITE PO) Take 1 tablet by mouth daily.   Brinzolamide-Brimonidine (SIMBRINZA) 1-0.2 % SUSP Place 1 drop into both eyes 2 (two) times daily. (0900 & 2100)   calcium carbonate (TUMS - DOSED IN MG ELEMENTAL CALCIUM) 500 MG chewable tablet Chew 1 tablet by mouth 2 (two) times daily.   cetirizine (ZYRTEC) 10 MG tablet Take 10 mg by mouth daily.   cholestyramine (QUESTRAN) 4 g packet Take 1 packet (4 g total) by mouth 2 (two) times daily. Take twice daily before breakfast and dinner   diazepam (VALIUM) 5 MG tablet Take 1 tablet (5 mg total) by mouth at bedtime.   diclofenac Sodium (VOLTAREN) 1 % GEL Apply 2 g topically See admin instructions. Apply 2 grams to the right knee at bedtime   digoxin 62.5 MCG TABS Take 0.0625 mg by mouth every Monday, Wednesday, and Friday.   DIMETHICONE, TOPICAL, (SECURA DIMETHICONE PROTECTANT) 5 % CREA Apply 1 application topically See admin instructions. Apply a nickel-sized amount onto the buttocks area 2 times a day for barrier protection   diphenoxylate-atropine (LOMOTIL) 2.5-0.025 MG tablet Take 1 tablet by mouth See admin instructions. Take 1  tablet by mouth prior to dialysis on Mon/Wed/Fri and up to 4 times a day as needed for diarrhea or loose stools   fluticasone (FLONASE) 50 MCG/ACT nasal spray Place 2 sprays into both nostrils daily.    ipratropium (ATROVENT) 0.03 % nasal spray Place 2 sprays into both nostrils every 12 (twelve) hours.   levofloxacin (LEVAQUIN) 500 MG tablet Take 500 mg by mouth 3 (three) times a week.   loperamide (IMODIUM A-D) 2 MG tablet Take 2 tablets (4 mg total) by mouth every 8 (eight) hours as needed for diarrhea or loose stools (for diarrhea).   Melatonin 5 MG CAPS Take 10 mg by mouth at bedtime.    Menthol, Topical Analgesic, (BIOFREEZE) 4 % GEL Apply 1 application topically  3 (three) times daily as needed (bilateral knee pain).   OXYGEN Inhale 2 L into the lungs continuous.    SALINE NASAL MIST NA Place 2 sprays into the nose 3 (three) times daily.   traZODone (DESYREL) 50 MG tablet Take 50 mg by mouth at bedtime.      Allergies:   Erythromycin, Codeine, Erythromycin base, Penicillins, and Zantac [ranitidine hcl]   Social History   Socioeconomic History   Marital status: Divorced    Spouse name: Not on file   Number of children: Not on file   Years of education: Not on file   Highest education level: Not on file  Occupational History   Not on file  Tobacco Use   Smoking status: Never Smoker   Smokeless tobacco: Never Used  Vaping Use   Vaping Use: Never used  Substance and Sexual Activity   Alcohol use: No   Drug use: No   Sexual activity: Not on file  Other Topics Concern   Not on file  Social History Narrative   [facility] (10/06/17):   Originally from [facility]. Has always lived in [place]. She currently lives in [place]. No bird exposure. [place] Previously traveled to [place], [place], & [place]. Previously worked in a nursery and also a Pharmacist, community.    Social Determinants of Health   Financial Resource Strain:    Difficulty of Paying Living  Expenses: Not on file  Food Insecurity:    Worried About Charity fundraiser in the Last Year: Not on file   [employer] of Food in the Last Year: Not on file  Transportation Needs:    Lack of Transportation (Medical): Not on file   Lack of Transportation (Non-Medical): Not on file  Physical Activity:    Days of Exercise per Week: Not on file   Minutes of Exercise per Session: Not on file  Stress:    Feeling of Stress : Not on file  Social Connections:    Frequency of Communication with Friends and Family: Not on file   Frequency of Social Gatherings with Friends and Family: Not on file   Attends Religious Services: Not on file   Active Member of Clubs or Organizations: Not on file   Attends Archivist Meetings: Not on file   Marital Status: Not on file     Family History: The patient's family history includes Diabetes in her mother; Heart attack in her father; Heart disease in her brother and mother; Lung disease in her brother; Multiple myeloma in her sister; Non-Hodgkin's lymphoma in her mother; Parkinson's disease in her brother; Stroke in her father.  ROS:   Please see the history of present illness.    All other systems reviewed and are negative.  EKGs/Labs/Other Studies Reviewed:    The following studies were reviewed today: ECG  EKG:  The ekg ordered today demonstrates sinus rhythm. QT 488m.   Recent Labs: 08/01/2020: ALT 13; B Natriuretic Peptide 1,092.0; TSH 0.947 08/03/2020: Magnesium 1.9 08/10/2020: BUN 22; Creatinine, Ser 3.29; Hemoglobin 9.9; Platelets 220; Potassium 4.4; Sodium 134  Recent Lipid Panel    Component Value Date/Time   CHOL 157 08/24/2018 0423   TRIG 147 08/24/2018 0423   HDL 32 (L) 08/24/2018 0423   CHOLHDL 4.9 08/24/2018 0423   VLDL 29 08/24/2018 0423   LDLCALC 96 08/24/2018 0423    Physical Exam:    VS:  BP 114/66    Pulse (!) 57  Ht _0  (1.651 m)    Wt 116 lb (52.6 kg)    SpO2 95%    BMI 19.30 kg/m     Wt  Readings from Last 3 Encounters:  09/11/20 116 lb (52.6 kg)  08/23/20 116 lb 13.5 oz (53 kg)  08/10/20 121 lb 14.6 oz (55.3 kg)     GEN: Frail, no distress in wheelchair on oxygen HEENT: Normal NECK: No JVD; No carotid bruits LYMPHATICS: No lymphadenopathy CARDIAC: RRR, no murmurs, rubs, gallops RESPIRATORY:  Clear to auscultation ABDOMEN: Soft, non-tender, non-distended MUSCULOSKELETAL:  No edema; No deformity  SKIN: Warm and dry NEUROLOGIC:  Alert and oriented x 3 PSYCHIATRIC:  Normal affect   ASSESSMENT:    1. Tachycardia-bradycardia syndrome ([place])    PLAN:    In order of problems listed above:  1. Tachy-brady syndrome Doing well. Tolerating dialysis without episodes of tachycardia. Plan to continue amiodarone 284m daily.  Stopped digoxin, would not restart unless breakthrough symptoms on amiodarone.   Medication Adjustments/Labs and Tests Ordered: Current medicines are reviewed at length with the patient today.  Concerns regarding medicines are outlined above.  Orders Placed This Encounter  Procedures   EKG 12-Lead   No orders of the defined types were placed in this encounter.    Signed, CLars Mage MD, [facility] 09/11/2020 2:24 PM    Electrophysiology [facility]

## 2020-09-11 NOTE — Patient Instructions (Addendum)
Medication Instructions:  Your physician has recommended you make the following change in your medication:   1.  STOP digoxin   Labwork: None ordered.  Testing/Procedures: None ordered.  Follow-Up: Your physician wants you to follow-up in: 6 months with Dr. Quentin Ore.     March 12, 2020 at 11:45 am at the [facility] office  Any Other Special Instructions Will Be Listed Below (If Applicable).  If you need a refill on your cardiac medications before your next appointment, please call your pharmacy.

## 2020-09-14 ENCOUNTER — Emergency Department (HOSPITAL_COMMUNITY): Payer: Medicare Other

## 2020-09-14 ENCOUNTER — Other Ambulatory Visit: Payer: Self-pay

## 2020-09-14 ENCOUNTER — Emergency Department (HOSPITAL_COMMUNITY)
Admission: EM | Admit: 2020-09-14 | Discharge: 2020-09-15 | Disposition: A | Discharge status: Home / Self Care | Payer: Medicare Other | Attending: Emergency Medicine | Admitting: Emergency Medicine

## 2020-09-14 ENCOUNTER — Encounter (HOSPITAL_COMMUNITY): Payer: Self-pay | Admitting: Emergency Medicine

## 2020-09-14 DIAGNOSIS — E1140 Type 2 diabetes mellitus with diabetic neuropathy, unspecified: Secondary | ICD-10-CM | POA: Insufficient documentation

## 2020-09-14 DIAGNOSIS — Z853 Personal history of malignant neoplasm of breast: Secondary | ICD-10-CM | POA: Diagnosis not present

## 2020-09-14 DIAGNOSIS — Z794 Long term (current) use of insulin: Secondary | ICD-10-CM | POA: Insufficient documentation

## 2020-09-14 DIAGNOSIS — W19XXXA Unspecified fall, initial encounter: Secondary | ICD-10-CM | POA: Insufficient documentation

## 2020-09-14 DIAGNOSIS — Z992 Dependence on renal dialysis: Secondary | ICD-10-CM | POA: Diagnosis not present

## 2020-09-14 DIAGNOSIS — Z79899 Other long term (current) drug therapy: Secondary | ICD-10-CM | POA: Insufficient documentation

## 2020-09-14 DIAGNOSIS — E1122 Type 2 diabetes mellitus with diabetic chronic kidney disease: Secondary | ICD-10-CM | POA: Insufficient documentation

## 2020-09-14 DIAGNOSIS — I5032 Chronic diastolic (congestive) heart failure: Secondary | ICD-10-CM | POA: Diagnosis not present

## 2020-09-14 DIAGNOSIS — G311 Senile degeneration of brain, not elsewhere classified: Secondary | ICD-10-CM | POA: Diagnosis not present

## 2020-09-14 DIAGNOSIS — Z7982 Long term (current) use of aspirin: Secondary | ICD-10-CM | POA: Diagnosis not present

## 2020-09-14 DIAGNOSIS — S7002XA Contusion of left hip, initial encounter: Secondary | ICD-10-CM | POA: Diagnosis not present

## 2020-09-14 DIAGNOSIS — M545 Low back pain: Secondary | ICD-10-CM | POA: Insufficient documentation

## 2020-09-14 DIAGNOSIS — N186 End stage renal disease: Secondary | ICD-10-CM | POA: Insufficient documentation

## 2020-09-14 DIAGNOSIS — I132 Hypertensive heart and chronic kidney disease with heart failure and with stage 5 chronic kidney disease, or end stage renal disease: Secondary | ICD-10-CM | POA: Diagnosis not present

## 2020-09-14 DIAGNOSIS — S79912A Unspecified injury of left hip, initial encounter: Secondary | ICD-10-CM | POA: Diagnosis present

## 2020-09-14 DIAGNOSIS — E1142 Type 2 diabetes mellitus with diabetic polyneuropathy: Secondary | ICD-10-CM | POA: Insufficient documentation

## 2020-09-14 LAB — COMPREHENSIVE METABOLIC PANEL
ALT: 29 U/L (ref 0–44)
AST: 50 U/L — ABNORMAL HIGH (ref 15–41)
Albumin: 3 g/dL — ABNORMAL LOW (ref 3.5–5.0)
Alkaline Phosphatase: 91 U/L (ref 38–126)
Anion gap: 12 (ref 5–15)
BUN: 33 mg/dL — ABNORMAL HIGH (ref 8–23)
CO2: 26 mmol/L (ref 22–32)
Calcium: 8.2 mg/dL — ABNORMAL LOW (ref 8.9–10.3)
Chloride: 101 mmol/L (ref 98–111)
Creatinine, Ser: 3.74 mg/dL — ABNORMAL HIGH (ref 0.44–1.00)
GFR calc Af Amer: 12 mL/min — ABNORMAL LOW (ref >60)
GFR calc non Af Amer: 11 mL/min — ABNORMAL LOW (ref >60)
Glucose, Bld: 88 mg/dL (ref 70–99)
Potassium: 3.7 mmol/L (ref 3.5–5.1)
Sodium: 139 mmol/L (ref 135–145)
Total Bilirubin: 0.6 mg/dL (ref 0.3–1.2)
Total Protein: 6.6 g/dL (ref 6.5–8.1)

## 2020-09-14 LAB — CBC WITH DIFFERENTIAL/PLATELET
Abs Immature Granulocytes: 0.02 10*3/uL (ref 0.00–0.07)
Basophils Absolute: 0.1 10*3/uL (ref 0.0–0.1)
Basophils Relative: 1 %
Eosinophils Absolute: 0.1 10*3/uL (ref 0.0–0.5)
Eosinophils Relative: 2 %
HCT: 44.3 % (ref 36.0–46.0)
Hemoglobin: 13.5 g/dL (ref 12.0–15.0)
Immature Granulocytes: 0 %
Lymphocytes Relative: 25 %
Lymphs Abs: 1.5 10*3/uL (ref 0.7–4.0)
MCH: 30.3 pg (ref 26.0–34.0)
MCHC: 30.5 g/dL (ref 30.0–36.0)
MCV: 99.6 fL (ref 80.0–100.0)
Monocytes Absolute: 0.4 10*3/uL (ref 0.1–1.0)
Monocytes Relative: 6 %
Neutro Abs: 4 10*3/uL (ref 1.7–7.7)
Neutrophils Relative %: 66 %
Platelets: 162 10*3/uL (ref 150–400)
RBC: 4.45 MIL/uL (ref 3.87–5.11)
RDW: 15.8 % — ABNORMAL HIGH (ref 11.5–15.5)
WBC: 6 10*3/uL (ref 4.0–10.5)
nRBC: 0 % (ref 0.0–0.2)

## 2020-09-14 MED ORDER — CYCLOBENZAPRINE HCL 10 MG PO TABS: 10.0000 mg | ORAL_TABLET | Freq: Two times a day (BID) | ORAL | 0 refills | Status: DC | PRN

## 2020-09-14 MED ORDER — FENTANYL CITRATE (PF) 100 MCG/2ML IJ SOLN
50.0000 ug | Freq: Once | INTRAMUSCULAR | Status: AC
  Administered 2020-09-14: 50 ug via INTRAVENOUS
  Filled 2020-09-14: qty 2

## 2020-09-14 NOTE — ED Triage Notes (Addendum)
Pt arrives via [facility] from [facility] where she was being transferred by staff in a hoyer lift that malfunctioned, staff states patient did not hit the floor and was lowered down gently, however, pt c/o L hip and leg pain and states that she hit the floor. No shortening or deformity per ems. Pt was supposed to go to dialysis today but did not make it due to incident, last session was Wednesday.  Pt wears 2L O2 at baseline. EMS VS 160/60, hr 66, 97 temp, 99% ra, cbg 121.

## 2020-09-14 NOTE — ED Provider Notes (Signed)
[facility] EMERGENCY DEPARTMENT Provider Note   CSN: [id number] Arrival date & time: 09/14/20  1209     History No chief complaint on file.   [name] James is a 84 y.o. female.  HPI      [name] James lift broke and she fell onto left side Now having left hip pain and lower back pain Whole body hit but worse on left side No neck pain, no headache, no numbness or weakness, no nausea 10/10 Dialysis M_W-F not able to go today because of this incident Feels like facility is saying it was not significant but notes she has a clear mind and fell onto her left side.   Past Medical History:  Diagnosis Date  . Anemia, chronic disease   . Anxiety   . Arthritis    knees  . Atrial fibrillation ([place])   . Barrett esophagus   . Benign paroxysmal positional vertigo   . Breast cancer ([place]) 1980s   bil mastectomies, no radiation or chemo  . Cataract   . Chest pain, atypical 12/05/2008   R/Lmv- normal perfusion all regions, noe ECG changes   . CHF (congestive heart failure) ([place]) 05/13/2011   echo - BO>17%; stage 1 diastolic dysfunction; elevated LV filling pressure, MAC  . CKD (chronic kidney disease), stage III   . Claudication ([place]) 10/30/2005   doppler - normal evaluation, no evidence of aneurysm, diameter reduction, dissection, compression or vascular abnormality)  . Complication of anesthesia   . Controlled type 2 diabetes mellitus with diabetic polyneuropathy, with long-term current use of insulin ([place]) 04/17/2016  . Dyslipidemia   . Dysrhythmia    Atrial Fibrillation  . Edema   . ESRD (end stage renal disease) on dialysis ([place])    "[facility]; [address]; MWF" (07/06/2018)  . Family history of adverse reaction to anesthesia    son also has nausea   . Fever blister 12/2016  . Gastritis and gastroduodenitis   . GERD (gastroesophageal reflux disease)   . Gout   . Hemodialysis-associated hypotension   . HLD (hyperlipidemia)   . Hypertension   . IBS (irritable  bowel syndrome)   . Insomnia   . Iron deficiency anemia   . Junctional bradycardia 08/25/2018  . LFT elevation   . Long term current use of amiodarone 10/17/2016  . Morbid (severe) obesity due to excess calories ([place]) 04/29/2017  . Morbid obesity ([place])   . On home oxygen therapy    "2L; 24/7" (07/06/2018)  . PAF (paroxysmal atrial fibrillation) ([place])   . Peripheral neuropathy   . Pneumonia 12/2016  . PONV (postoperative nausea and vomiting)   . Renal cyst   . Renal insufficiency 10/30/2005   doppler - abn resistance consistent w/ parenchymal disease  . Renovascular hypertension   . Type 2 diabetes mellitus with diabetic neuropathy, with long-term current use of insulin ([place])   . Vertigo     Patient Active Problem List   Diagnosis Date Noted  . Atrial fibrillation with RVR ([place]) 08/03/2020  . Nausea and vomiting 03/14/2019  . Chronic diarrhea 03/14/2019  . Dysuria 03/14/2019  . UTI (urinary tract infection) 03/14/2019  . Aortic atherosclerosis ([place]) 12/30/2018  . Hypoalbuminemia due to protein-calorie malnutrition ([place]) 12/30/2018  . Tachycardia-bradycardia syndrome ([place])   . Symptomatic bradycardia 08/24/2018  . Gout 08/23/2018  . Anxiety 08/23/2018  . Pneumothorax, left 10/13/2017  . Multiple lung nodules on CT 10/06/2017  . Pleural effusion, left 10/06/2017  . Diabetic polyneuropathy associated with type 2 diabetes  mellitus ([place]) 07/30/2017  . Onychomycosis of toenail 07/30/2017  . Idiopathic chronic venous hypertension of both lower extremities with inflammation 07/30/2017  . Chronic respiratory failure with hypoxia ([place]) 04/29/2017  . Advance care planning   . Goals of care, counseling/discussion   . Palliative care by specialist   . Pressure ulcer 01/19/2017  . History of pressure ulcer 10/17/2016  . GAD (generalized anxiety disorder) 10/17/2016  . Osteopenia determined by x-ray 04/17/2016  . Type 2 diabetes mellitus with diabetic neuropathy ([place]) 06/08/2015  . Type 2  diabetes, controlled, with renal manifestation ([place]) 06/08/2015  . PVD (peripheral vascular disease) ([place]) 06/08/2015  . Swelling of limb-Left arm 04/10/2015  . Nausea with vomiting 01/12/2015  . Normocytic anemia 01/12/2015  . Paroxysmal atrial fibrillation ([place]) 12/22/2014  . Melena   . ESRD on dialysis ([place]) 12/19/2014  . Chronic diastolic (congestive) heart failure ([place]) 12/19/2014  . Palpitation 12/19/2014  . Anemia, chronic disease 12/13/2014  . Hypotension 12/13/2014  . Pulmonary edema 11/10/2014  . Physical deconditioning 10/29/2014  . Gastritis and gastroduodenitis 10/24/2014  . Heme positive stool 10/24/2014  . Morbid obesity ([place]) 10/18/2014  . Edema 10/18/2014  . Essential hypertension 10/17/2014  . DM type 2, uncontrolled, with renal complications ([place]) 31/49/7026  . Dyslipidemia 10/17/2014    Past Surgical History:  Procedure Laterality Date  . A/V FISTULAGRAM Left 03/19/2018   Procedure: A/V FISTULAGRAM;  Surgeon: Angelia Mould, MD;  Location: [place] [facility];  Service: Cardiovascular;  Laterality: Left;  . A/V FISTULAGRAM N/A 12/24/2018   Procedure: A/V FISTULAGRAM - Right Arm;  Surgeon: Angelia Mould, MD;  Location: [place] [facility];  Service: Cardiovascular;  Laterality: N/A;  . A/V FISTULAGRAM Right 03/29/2019   Procedure: A/V FISTULAGRAM;  Surgeon: Angelia Mould, MD;  Location: [place] [facility];  Service: Cardiovascular;  Laterality: Right;  . A/V FISTULAGRAM Right 02/03/2020   Procedure: A/V FISTULAGRAM;  Surgeon: Angelia Mould, MD;  Location: [place] [facility];  Service: Cardiovascular;  Laterality: Right;  . A/V SHUNTOGRAM Left 01/19/2018   Procedure: A/V SHUNTOGRAM;  Surgeon: Serafina Mitchell, MD;  Location: [place] [facility];  Service: Cardiovascular;  Laterality: Left;  . ANGIOPLASTY Left 02/12/2015   Procedure: ANGIOPLASTY;  Surgeon: Angelia Mould, MD;  Location: [facility] CATH LAB;  Service: Cardiovascular;   Laterality: Left;  AVF  . AV FISTULA PLACEMENT Left 11/30/2014   Procedure: ARTERIOVENOUS (AV) FISTULA CREATION LEFT ARM;  Surgeon: Angelia Mould, MD;  Location: [place];  Service: Vascular;  Laterality: Left;  . AV FISTULA PLACEMENT Left 03/20/2015   Procedure:  Inserton of Left Upper Arm Gortex Graft;  Surgeon: Angelia Mould, MD;  Location: [place];  Service: Vascular;  Laterality: Left;  . AV FISTULA PLACEMENT Left 04/12/2015   Procedure: INSERTION OF LEFT ARM  ARTERIOVENOUS GORE-TEX GRAFT ;  Surgeon: Angelia Mould, MD;  Location: [place];  Service: Vascular;  Laterality: Left;  . AV FISTULA PLACEMENT Right 06/08/2018   Procedure: INSERTION OF ARTERIOVENOUS (AV) GORE-TEX GRAFT ARM USING 4-7MM X 45CM GORETEX GRAFT;  Surgeon: Angelia Mould, MD;  Location: [place];  Service: Vascular;  Laterality: Right;  . BACK SURGERY  ~1980  . CATARACT EXTRACTION Right   . CHOLECYSTECTOMY    . ESOPHAGOGASTRODUODENOSCOPY N/A 10/24/2014   Procedure: ESOPHAGOGASTRODUODENOSCOPY (EGD);  Surgeon: Jerene Bears, MD;  Location: [facility] ENDOSCOPY;  Service: Endoscopy;  Laterality: N/A;  . EXCHANGE OF A DIALYSIS CATHETER Left 11/30/2014  Procedure: EXCHANGE OF A DIALYSIS CATHETER, LEFT INTERNAL JUGULAR;  Surgeon: Angelia Mould, MD;  Location: [place];  Service: Vascular;  Laterality: Left;  . EYE SURGERY Left    transplant  . FISTULOGRAM N/A 02/12/2015   Procedure: FISTULOGRAM;  Surgeon: Angelia Mould, MD;  Location: [facility] CATH LAB;  Service: Cardiovascular;  Laterality: N/A;  . HEMORRHOID SURGERY    . IR AV DIALY SHUNT INTRO NEEDLE/INTRACATH INITIAL W/PTA/IMG RIGHT Right 08/25/2018  . IR AV DIALY SHUNT INTRO NEEDLE/INTRACATH INITIAL W/PTA/IMG RIGHT Right 11/17/2018  . IR US GUIDE VASC ACCESS RIGHT  08/25/2018  . LIGATION ARTERIOVENOUS GORTEX GRAFT Left 08/27/2018   Procedure: LIGATION ARTERIOVENOUS GORTEX GRAFT ARM;  Surgeon: Angelia Mould, MD;  Location: [place];  Service: Vascular;   Laterality: Left;  . LIGATION OF ARTERIOVENOUS  FISTULA Left 03/20/2015   Procedure: LIGATION OF ARTERIOVENOUS  FISTULA;  Surgeon: Angelia Mould, MD;  Location: [place];  Service: Vascular;  Laterality: Left;  Marland Kitchen MASTECTOMY Bilateral ~1982   bil breast reconstruction with implants  . ORIF TIBIA & FIBULA FRACTURES Left 2007   also had left non displaced malleolar fracture.   Marland Kitchen PERIPHERAL VASCULAR BALLOON ANGIOPLASTY Left 01/19/2018   Procedure: PERIPHERAL VASCULAR BALLOON ANGIOPLASTY;  Surgeon: Serafina Mitchell, MD;  Location: [place] [facility];  Service: Cardiovascular;  Laterality: Left;  fistula  . PERIPHERAL VASCULAR BALLOON ANGIOPLASTY Left 03/19/2018   Procedure: PERIPHERAL VASCULAR BALLOON ANGIOPLASTY;  Surgeon: Angelia Mould, MD;  Location: [place] [facility];  Service: Cardiovascular;  Laterality: Left;  UPPER ARM FISTULA  . PERIPHERAL VASCULAR BALLOON ANGIOPLASTY Right 12/24/2018   Procedure: PERIPHERAL VASCULAR BALLOON ANGIOPLASTY;  Surgeon: Angelia Mould, MD;  Location: [place] [facility];  Service: Cardiovascular;  Laterality: Right;  arm fistula  . PERIPHERAL VASCULAR BALLOON ANGIOPLASTY  03/29/2019   Procedure: PERIPHERAL VASCULAR BALLOON ANGIOPLASTY;  Surgeon: Angelia Mould, MD;  Location: [place] [facility];  Service: Cardiovascular;;  . PERIPHERAL VASCULAR BALLOON ANGIOPLASTY Right 02/03/2020   Procedure: PERIPHERAL VASCULAR BALLOON ANGIOPLASTY;  Surgeon: Angelia Mould, MD;  Location: [place] [facility];  Service: Cardiovascular;  Laterality: Right;  upper arm fistula  . PERIPHERAL VASCULAR BALLOON ANGIOPLASTY  06/22/2020   Procedure: PERIPHERAL VASCULAR BALLOON ANGIOPLASTY;  Surgeon: Angelia Mould, MD;  Location: [place] [facility];  Service: Cardiovascular;;  Right AVF  . REMOVAL OF GRAFT Left 04/12/2015   Procedure: REMOVAL OF LEFT ARM ARTERIOVENOUS GORE-TEX GRAFT;  Surgeon: Angelia Mould, MD;  Location: [place];  Service:  Vascular;  Laterality: Left;  . REVISION OF ARTERIOVENOUS GORETEX GRAFT Right 12/13/2019   Procedure: REVISION OF ARTERIOVENOUS GORETEX GRAFT RIGHT FOREARM;  Surgeon: Angelia Mould, MD;  Location: [place];  Service: Vascular;  Laterality: Right;     OB History   No obstetric history on file.     Family History  Problem Relation Age of Onset  . Diabetes Mother   . Heart disease Mother   . Non-Hodgkin's lymphoma Mother   . Heart attack Father   . Stroke Father   . Parkinson's disease Brother   . Lung disease Brother   . Heart disease Brother   . Multiple myeloma Sister     Social History   Tobacco Use  . Smoking status: Never Smoker  . Smokeless tobacco: Never Used  Vaping Use  . Vaping Use: Never used  Substance Use Topics  . Alcohol use: No  . Drug use: No  Home Medications Prior to Admission medications   Medication Sig Start Date End Date Taking? Authorizing Provider  acetaminophen (TYLENOL) 325 MG tablet Take 650 mg by mouth every 6 (six) hours as needed for fever (greater than 100 F).    Yes [provider]  allopurinol (ZYLOPRIM) 100 MG tablet Take 100 mg by mouth daily.    Yes [provider]  amiodarone (PACERONE) 200 MG tablet Take 2 tablets (420m) daily for 7 days. Thereafter take 1 tablet (2059m daily. 08/10/20  Yes Mikhail, MaVelta AddisonDO  ascorbic acid (HM VITAMIN C) 500 MG tablet Take 500 mg by mouth daily.   Yes [provider]  aspirin 81 MG chewable tablet Chew 81 mg by mouth daily.    Yes [provider]  B Complex-C-Folic Acid (NEPHRO-VITE PO) Take 1 tablet by mouth daily.   Yes [provider]  Brinzolamide-Brimonidine (SIMBRINZA) 1-0.2 % SUSP Place 1 drop into both eyes 2 (two) times daily. (0900 & 2100)   Yes [provider]  calcium carbonate (TUMS - DOSED IN MG ELEMENTAL CALCIUM) 500 MG chewable tablet Chew 1 tablet by mouth 2 (two) times daily.   Yes [provider]  cetirizine  (ZYRTEC) 10 MG tablet Take 10 mg by mouth daily.   Yes [provider]  cholestyramine (QUESTRAN) 4 g packet Take 1 packet (4 g total) by mouth 2 (two) times daily. Take twice daily before breakfast and dinner 10/13/19  Yes Lemmon, JeLavone NianPA  diazepam (VALIUM) 5 MG tablet Take 1 tablet (5 mg total) by mouth at bedtime. 07/08/18  Yes Arrien, MaJimmy PicketMD  diclofenac Sodium (VOLTAREN) 1 % GEL Apply 2 g topically See admin instructions. Apply 2 grams to the right knee at bedtime 06/04/20  Yes [provider]  DIMETHICONE, TOPICAL, (SECURA DIMETHICONE PROTECTANT) 5 % CREA Apply 1 application topically See admin instructions. Apply a nickel-sized amount onto the buttocks area 2 times a day for barrier protection   Yes [provider]  diphenoxylate-atropine (LOMOTIL) 2.5-0.025 MG tablet Take 1 tablet by mouth as directed. Take 1 tablet by mouth every 6 hours PRN for diarrhea or loose stools   Yes [provider]  fluticasone (FLONASE) 50 MCG/ACT nasal spray Place 2 sprays into both nostrils daily.    Yes [provider]  ipratropium (ATROVENT) 0.03 % nasal spray Place 2 sprays into both nostrils every 12 (twelve) hours. 08/02/20  Yes Hunsucker, MaBonna GainsMD  loperamide (IMODIUM A-D) 2 MG tablet Take 2 tablets (4 mg total) by mouth every 8 (eight) hours as needed for diarrhea or loose stools (for diarrhea). Patient taking differently: Take 8 mg by mouth as directed. Take 2 tablets (8 mg) Daily on MWF. She may have 2 tablets (8 mg) prior to Hemodialysis 03/01/19  Yes Pyrtle, JaLajuan LinesMD  Melatonin 5 MG CAPS Take 10 mg by mouth at bedtime.    Yes [provider]  Menthol, Topical Analgesic, (BIOFREEZE) 4 % GEL Apply 1 application topically 3 (three) times daily as needed (bilateral knee pain).   Yes [provider]  midodrine (PROAMATINE) 10 MG tablet Take 10 mg by mouth as directed. Take 1 tablet (10 mg) on MWF for BP Support At Dialysis  09/14/20  Yes [provider]  midodrine (PROAMATINE) 2.5 MG tablet Take 2.5 mg by mouth as directed. Take 1 tablet TID With Meals for BP. Give if BP<120/60   Yes [provider]  promethazine (PHENERGAN) 25 MG tablet Take  25 mg by mouth 2 (two) times daily as needed for nausea or vomiting.  08/24/20  Yes [provider]  SALINE NASAL MIST NA Place 2 sprays into the nose 3 (three) times daily.   Yes [provider]  traZODone (DESYREL) 50 MG tablet Take 50 mg by mouth at bedtime.    Yes [provider]  cyclobenzaprine (FLEXERIL) 10 MG tablet Take 1 tablet (10 mg total) by mouth 2 (two) times daily as needed for muscle spasms. 09/14/20   Gareth Morgan, MD  OXYGEN Inhale 2 L into the lungs continuous.     [provider]    Allergies    Erythromycin, Codeine, Erythromycin base, Penicillins, and Zantac [ranitidine hcl]  Review of Systems   Review of Systems  Constitutional: Negative for fever.  HENT: Negative for sore throat.   Eyes: Negative for visual disturbance.  Respiratory: Negative for cough and shortness of breath.   Cardiovascular: Negative for chest pain.  Gastrointestinal: Negative for abdominal pain.  Genitourinary: Negative for difficulty urinating.  Musculoskeletal: Positive for arthralgias and back pain. Negative for neck pain.  Skin: Negative for rash.  Neurological: Negative for syncope and headaches.    Physical Exam Updated Vital Signs BP (!) 177/51   Pulse (!) 53   Temp 97.6 F (36.4 C) (Oral)   Resp 16   Ht _0  (1.651 m)   Wt 52 kg   SpO2 95%   BMI 19.08 kg/m   Physical Exam Vitals and nursing note reviewed.  Constitutional:      General: She is not in acute distress.    Appearance: She is well-developed. She is not diaphoretic.  HENT:     Head: Normocephalic and atraumatic.  Eyes:     Conjunctiva/sclera: Conjunctivae normal.  Cardiovascular:     Rate and Rhythm: Normal rate and regular rhythm.       Heart sounds: Normal heart sounds. No murmur heard.  No friction rub. No gallop.   Pulmonary:     Effort: Pulmonary effort is normal. No respiratory distress.     Breath sounds: Normal breath sounds. No wheezing or rales.  Abdominal:     General: There is no distension.     Palpations: Abdomen is soft.     Tenderness: There is no abdominal tenderness. There is no guarding.  Musculoskeletal:        General: No tenderness.     Cervical back: Normal range of motion.  Skin:    General: Skin is warm and dry.     Findings: No erythema or rash.  Neurological:     Mental Status: She is alert and oriented to person, place, and time.     ED Results / Procedures / Treatments   Labs (all labs ordered are listed, but only abnormal results are displayed) Labs Reviewed  CBC WITH DIFFERENTIAL/PLATELET - Abnormal; Notable for the following components:      Result Value   RDW 15.8 (*)    All other components within normal limits  COMPREHENSIVE METABOLIC PANEL - Abnormal; Notable for the following components:   BUN 33 (*)    Creatinine, Ser 3.74 (*)    Calcium 8.2 (*)    Albumin 3.0 (*)    AST 50 (*)    GFR calc non Af Amer 11 (*)    GFR calc Af Amer 12 (*)    All other components within normal limits    EKG EKG Interpretation  Date/Time:  Friday September 14 2020 15:35:02  EDT Ventricular Rate:  55 PR Interval:    QRS Duration: 111 QT Interval:  421 QTC Calculation: 403 R Axis:   44 Text Interpretation: Junctional rhythm or atrial fibrillation Incomplete left bundle branch block Since prior ECG, rate has slowed Confirmed by Gareth Morgan [phone]) on 09/15/2020 12:45:05 AM   Radiology DG Knee 2 Views Left  Result Date: 09/14/2020 CLINICAL DATA:  Left leg pain, trauma EXAM: LEFT KNEE - 1-2 VIEW COMPARISON:  None. FINDINGS: Frontal and cross-table lateral views of the left knee demonstrates severe osteopenia. Postsurgical changes from prior tibial ORIF. No acute displaced  fracture, subluxation, or dislocation. Moderate left knee osteoarthritis. Extensive atherosclerosis. Diffuse soft tissue edema. IMPRESSION: 1. Severe osteopenia. 2. No acute fracture. 3. Osteoarthritis. Electronically Signed   By: Randa Ngo M.D.   On: 09/14/2020 17:31   CT Head Wo Contrast  Result Date: 09/14/2020 CLINICAL DATA:  Trauma. EXAM: CT HEAD WITHOUT CONTRAST CT CERVICAL SPINE WITHOUT CONTRAST TECHNIQUE: Multidetector CT imaging of the head and cervical spine was performed following the standard protocol without intravenous contrast. Multiplanar CT image reconstructions of the cervical spine were also generated. COMPARISON:  07/22/2017 CT neck.  02/04/2004 head CT. FINDINGS: Motion degraded examination. CT HEAD FINDINGS Brain: No acute infarct or intracranial hemorrhage. Mild cerebral atrophy with ex vacuo dilatation. Right frontal calcifications may reflect remote post infectious/inflammatory sequela. No mass lesion. No midline shift, ventriculomegaly or extra-axial fluid collection. Vascular: No hyperdense vessel or unexpected calcification. Bilateral skull base atherosclerotic calcifications. Skull: Negative for fracture or focal lesion. Sinuses/Orbits: Normal orbits. Clear paranasal sinuses. No mastoid effusion. Other: None. CT CERVICAL SPINE FINDINGS Alignment: Straightening of lordosis. Grade 1 C4-5 anterolisthesis. Grade 1 C5-6 retrolisthesis. Skull base and vertebrae: No acute fracture. No focal pathologic process. C6 bone island. Soft tissues and spinal canal: No prevertebral fluid or swelling. No visible canal hematoma. Disc levels: Multilevel degenerative changes including endplate sclerosis, osteophytosis and disc space loss most prominent at the C5-6 level. Upper thoracic ossification of the anterior longitudinal ligament. Multilevel facet hypertrophy. Patent bony spinal canal. Upper chest: Minimal atelectasis. Other: None. IMPRESSION: CT HEAD: No acute intracranial process.  Mild  cerebral atrophy. CT CERVICAL SPINE: No acute fracture or traumatic listhesis. Multilevel spondylosis. Electronically Signed   By: Primitivo Gauze M.D.   On: 09/14/2020 19:39   CT Cervical Spine Wo Contrast  Result Date: 09/14/2020 CLINICAL DATA:  Trauma. EXAM: CT HEAD WITHOUT CONTRAST CT CERVICAL SPINE WITHOUT CONTRAST TECHNIQUE: Multidetector CT imaging of the head and cervical spine was performed following the standard protocol without intravenous contrast. Multiplanar CT image reconstructions of the cervical spine were also generated. COMPARISON:  07/22/2017 CT neck.  02/04/2004 head CT. FINDINGS: Motion degraded examination. CT HEAD FINDINGS Brain: No acute infarct or intracranial hemorrhage. Mild cerebral atrophy with ex vacuo dilatation. Right frontal calcifications may reflect remote post infectious/inflammatory sequela. No mass lesion. No midline shift, ventriculomegaly or extra-axial fluid collection. Vascular: No hyperdense vessel or unexpected calcification. Bilateral skull base atherosclerotic calcifications. Skull: Negative for fracture or focal lesion. Sinuses/Orbits: Normal orbits. Clear paranasal sinuses. No mastoid effusion. Other: None. CT CERVICAL SPINE FINDINGS Alignment: Straightening of lordosis. Grade 1 C4-5 anterolisthesis. Grade 1 C5-6 retrolisthesis. Skull base and vertebrae: No acute fracture. No focal pathologic process. C6 bone island. Soft tissues and spinal canal: No prevertebral fluid or swelling. No visible canal hematoma. Disc levels: Multilevel degenerative changes including endplate sclerosis, osteophytosis and disc space loss most prominent at the C5-6 level. Upper thoracic ossification of the anterior  longitudinal ligament. Multilevel facet hypertrophy. Patent bony spinal canal. Upper chest: Minimal atelectasis. Other: None. IMPRESSION: CT HEAD: No acute intracranial process.  Mild cerebral atrophy. CT CERVICAL SPINE: No acute fracture or traumatic listhesis. Multilevel  spondylosis. Electronically Signed   By: Primitivo Gauze M.D.   On: 09/14/2020 19:39   CT Lumbar Spine Wo Contrast  Result Date: 09/14/2020 CLINICAL DATA:  Low back pain, trauma. EXAM: CT LUMBAR SPINE WITHOUT CONTRAST TECHNIQUE: Multidetector CT imaging of the lumbar spine was performed without intravenous contrast administration. Multiplanar CT image reconstructions were also generated. COMPARISON:  01/22/2016 CT abdomen pelvis and prior. FINDINGS: Segmentation: 5 lumbar type vertebrae. Alignment: Straightening of lordosis. Vertebrae: No acute fracture or focal pathologic process. Vertebral body heights are preserved. Multilevel endplate sclerosis, disc space loss and bridging osteophytosis. Paraspinal and other soft tissues: Bilateral renal atrophy. Extensive atherosclerotic calcifications. Paraspinal soft tissues within normal limits. Disc levels: Patent bony spinal canal and neural foramen. IMPRESSION: No acute fracture or traumatic listhesis. Multilevel spondylosis. Electronically Signed   By: Primitivo Gauze M.D.   On: 09/14/2020 19:26   CT Hip Left Wo Contrast  Result Date: 09/14/2020 CLINICAL DATA:  Left hip pain. EXAM: CT OF THE LEFT HIP WITHOUT CONTRAST TECHNIQUE: Multidetector CT imaging of the left hip was performed according to the standard protocol. Multiplanar CT image reconstructions were also generated. COMPARISON:  CT scan 01/22/2016 and radiographs 09/14/2020 FINDINGS: The left hip is normally located. No acute hip fracture is identified. There is advanced osteoporosis noted. Moderate left hip joint degenerative changes with chondrocalcinosis. The visualized left hemipelvis is intact. No pelvic fractures. Stable sclerotic lesion in the iliac bone is likely a benign bone island. Moderate diffuse fatty atrophy of the left hip and pelvic musculature. No obvious muscle tear or intramuscular hematoma. Moderate subcutaneous soft tissue swelling/edema/fluid without discrete high  attenuation hematoma. Severe vascular disease. No significant intrapelvic abnormalities are identified. IMPRESSION: 1. No acute hip fracture or worrisome bone lesion. 2. Moderate subcutaneous soft tissue swelling/edema/fluid without discrete high attenuation hematoma. 3. Advanced osteoporosis. 4. Moderate left hip joint degenerative changes with chondrocalcinosis. 5. Severe vascular disease. Electronically Signed   By: Marijo Sanes M.D.   On: 09/14/2020 19:30   DG Chest Portable 1 View  Result Date: 09/14/2020 CLINICAL DATA:  [place], previous tobacco abuse EXAM: PORTABLE CHEST 1 VIEW COMPARISON:  A 1321 FINDINGS: Single frontal view of the chest demonstrates a stable cardiac silhouette. Chronic veiling opacity at the left lung base consistent with left pleural effusion and/or atelectasis. Trace right pleural effusion is noted. No pneumothorax. No acute displaced fractures. IMPRESSION: 1. Bilateral pleural effusions, left greater than right. Electronically Signed   By: Randa Ngo M.D.   On: 09/14/2020 15:53   DG Foot Complete Left  Result Date: 09/14/2020 CLINICAL DATA:  Trauma, pain EXAM: LEFT FOOT - COMPLETE 3+ VIEW COMPARISON:  05/21/2017 FINDINGS: Frontal, oblique, and lateral views of the left foot are obtained. The bones are severely osteopenic. Evidence of prior healed fractures at the base of the second through fifth metatarsals. No acute displaced fracture, subluxation, or dislocation. There is extensive atherosclerosis. Diffuse soft tissue edema. IMPRESSION: 1. Severe osteopenia. 2. No acute fracture. Prior healed second through fifth metatarsal fractures. 3. Diffuse soft tissue edema. Electronically Signed   By: Randa Ngo M.D.   On: 09/14/2020 17:32   DG Hip Unilat With Pelvis 2-3 Views Left  Result Date: 09/14/2020 CLINICAL DATA:  Injury.  Left hip pain. EXAM: DG HIP (WITH OR WITHOUT  PELVIS) 2-3V LEFT COMPARISON:  None. FINDINGS: Findings suspicious for impacted subcapital fracture on  the left. Severe osteopenia limits bone detail. Left hip joint normal. Extensive overlying vascular calcification. IMPRESSION: Findings suspicious for impacted subcapital fracture on the left. Recommend follow-up CT or MRI to confirm. Electronically Signed   By: Franchot Gallo M.D.   On: 09/14/2020 12:48    Procedures Procedures (including critical care time)  Medications Ordered in ED Medications  fentaNYL (SUBLIMAZE) injection 50 mcg (50 mcg Intravenous Given 09/14/20 1631)  fentaNYL (SUBLIMAZE) injection 50 mcg (50 mcg Intravenous Given 09/14/20 1951)    ED Course  I have reviewed the triage vital signs and the nursing notes.  Pertinent labs & imaging results that were available during my care of the patient were reviewed by me and considered in my medical decision making (see chart for details).    MDM Rules/Calculators/A&P                          84 year old female on dialysis Monday Wednesday Friday, CHF, atrial fibrillation not on anticoagulation with exception of heparin at dialysis, breast cancer, hypertension, hyperlipidemia, diabetes, peripheral vascular disease, history of weakness/bedbound who usually uses a Engineer, drilling for transfers presents from [facility] with concern for left hip pain after the [name] James broke and she fell to the ground.  Labs obtained given patient was supposed to go to dialysis today showed no evidence of need for emergent dialysis.  She otherwise has no acute medical concerns.  She is here for traumatic hip pain after fall, notes other areas of tenderness on exam.  Initial x-rays without concern for injuries except possible fracture on left hip with recommendation for further imaging.  CT head, cervical spine, lumbar spine and left hip were obtained which showed no evidence of fracture.  Given no sign of fracture on CT, signs of some soft tissue injury, suspect pain is related to soft tissue injury from fall.  Recommend supportive care with ice, muscle relaxants.   Offered lidocaine patch, however patient declined.  Recommend follow up with PCP.  Patient discharged in stable condition with understanding of reasons to return.        Final Clinical Impression(s) / ED Diagnoses Final diagnoses:  Fall, initial encounter  Contusion of left hip, initial encounter    Rx / DC Orders ED Discharge Orders         Ordered    cyclobenzaprine (FLEXERIL) 10 MG tablet  2 times daily PRN,   Status:  Discontinued        09/14/20 2031    cyclobenzaprine (FLEXERIL) 10 MG tablet  2 times daily PRN        09/14/20 2101           Gareth Morgan, MD 09/15/20 0050

## 2020-09-14 NOTE — ED Notes (Signed)
Patient verbalizes understanding of discharge instructions. Opportunity for questioning and answers were provided. Pt is now awaiting for transport back to facility. [facility] en route.

## 2020-09-14 NOTE — ED Notes (Signed)
[facility] called  

## 2020-09-14 NOTE — ED Notes (Signed)
Patient transported to X-ray 

## 2020-09-25 ENCOUNTER — Ambulatory Visit (INDEPENDENT_AMBULATORY_CARE_PROVIDER_SITE_OTHER): Payer: Medicare Other | Admitting: Orthopaedic Surgery

## 2020-09-25 ENCOUNTER — Encounter: Payer: Self-pay | Admitting: Orthopaedic Surgery

## 2020-09-25 ENCOUNTER — Ambulatory Visit: Payer: Self-pay

## 2020-09-25 DIAGNOSIS — M25552 Pain in left hip: Secondary | ICD-10-CM

## 2020-09-25 DIAGNOSIS — I2510 Atherosclerotic heart disease of native coronary artery without angina pectoris: Secondary | ICD-10-CM

## 2020-09-25 NOTE — Progress Notes (Signed)
Office Visit Note   Patient: [name] James           Date of Birth: December 04, 1936           MRN: [id number] Visit Date: 09/25/2020              Requested by: Patrick Jupiter [address] [place],  [place] [address] PCP: Place, [place]: Visit Diagnoses:  1. Pain in left hip     Plan: I gave her reassurance that I do not see any fracture even it now several weeks after the accident.  Follow-up can be as needed.  I have recommended some tramadol as needed for pain control.  All questions and concerns were answered and addressed.  Follow-Up Instructions: Return if symptoms worsen or fail to improve.   Orders:  Orders Placed This Encounter  Procedures  . XR HIP UNILAT W OR W/O PELVIS 2-3 VIEWS LEFT   No orders of the defined types were placed in this encounter.     Procedures: No procedures performed   Clinical Data: No additional findings.   Subjective: Chief Complaint  Patient presents with  . Left Hip - Injury  The patient is a 84 year old female who is referred from emergency room with left hip pain after being dropped from a [employer] lift onto that left hip.  She is someone who has significant renal osteodystrophy with osteopenic bone.  She was having significant left hip pain.  This occurred on September 24.  X-rays and CT scan were inconclusive of any type of fracture at all.  The patient does not ambulate at all and is only in a wheelchair.  She does report left hip pain that is moderate.  She is also currently a dialysis patient.  Her grandson is with her today.  She reports only left hip pain and participates in the exam appropriately.  Cognitively she seems to be doing well.  HPI  Review of Systems She currently denies any headache, chest pain, shortness of breath, fever, chills, nausea, vomiting.  Objective: Vital Signs: There were no vitals taken for this visit.  Physical Exam She is alert and oriented and in no acute distress Ortho  Exam Examination her left hip shows it moves smoothly and fluidly with just some mild pain in the groin.  The remainder of her lower extremity and pelvic exam as well as right lower extremity exams are normal. Specialty Comments:  No specialty comments available.  Imaging: XR HIP UNILAT W OR W/O PELVIS 2-3 VIEWS LEFT  Result Date: 09/25/2020 Two views of the left hip show no acute findings and no fracture of the hip or pelvis.  The bone is significantly osteopenic.    PMFS History: Patient Active Problem List   Diagnosis Date Noted  . Atrial fibrillation with RVR ([place]) 08/03/2020  . Nausea and vomiting 03/14/2019  . Chronic diarrhea 03/14/2019  . Dysuria 03/14/2019  . UTI (urinary tract infection) 03/14/2019  . Aortic atherosclerosis ([place]) 12/30/2018  . Hypoalbuminemia due to protein-calorie malnutrition ([place]) 12/30/2018  . Tachycardia-bradycardia syndrome ([place])   . Symptomatic bradycardia 08/24/2018  . Gout 08/23/2018  . Anxiety 08/23/2018  . Pneumothorax, left 10/13/2017  . Multiple lung nodules on CT 10/06/2017  . Pleural effusion, left 10/06/2017  . Diabetic polyneuropathy associated with type 2 diabetes mellitus ([place]) 07/30/2017  . Onychomycosis of toenail 07/30/2017  . Idiopathic chronic venous hypertension of both lower extremities with inflammation 07/30/2017  . Chronic respiratory failure with hypoxia (  [place]) 04/29/2017  . Advance care planning   . Goals of care, counseling/discussion   . Palliative care by specialist   . Pressure ulcer 01/19/2017  . History of pressure ulcer 10/17/2016  . GAD (generalized anxiety disorder) 10/17/2016  . Osteopenia determined by x-ray 04/17/2016  . Type 2 diabetes mellitus with diabetic neuropathy ([place]) 06/08/2015  . Type 2 diabetes, controlled, with renal manifestation ([place]) 06/08/2015  . PVD (peripheral vascular disease) ([place]) 06/08/2015  . Swelling of limb-Left arm 04/10/2015  . Nausea with vomiting 01/12/2015  . Normocytic  anemia 01/12/2015  . Paroxysmal atrial fibrillation ([place]) 12/22/2014  . Melena   . ESRD on dialysis ([place]) 12/19/2014  . Chronic diastolic (congestive) heart failure ([place]) 12/19/2014  . Palpitation 12/19/2014  . Anemia, chronic disease 12/13/2014  . Hypotension 12/13/2014  . Pulmonary edema 11/10/2014  . Physical deconditioning 10/29/2014  . Gastritis and gastroduodenitis 10/24/2014  . Heme positive stool 10/24/2014  . Morbid obesity ([place]) 10/18/2014  . Edema 10/18/2014  . Essential hypertension 10/17/2014  . DM type 2, uncontrolled, with renal complications ([place]) 16/96/7893  . Dyslipidemia 10/17/2014   Past Medical History:  Diagnosis Date  . Anemia, chronic disease   . Anxiety   . Arthritis    knees  . Atrial fibrillation ([place])   . Barrett esophagus   . Benign paroxysmal positional vertigo   . Breast cancer ([place]) 1980s   bil mastectomies, no radiation or chemo  . Cataract   . Chest pain, atypical 12/05/2008   R/Lmv- normal perfusion all regions, noe ECG changes   . CHF (congestive heart failure) ([place]) 05/13/2011   echo - YB>01%; stage 1 diastolic dysfunction; elevated LV filling pressure, MAC  . CKD (chronic kidney disease), stage III ([place])   . Claudication ([place]) 10/30/2005   doppler - normal evaluation, no evidence of aneurysm, diameter reduction, dissection, compression or vascular abnormality)  . Complication of anesthesia   . Controlled type 2 diabetes mellitus with diabetic polyneuropathy, with long-term current use of insulin ([place]) 04/17/2016  . Dyslipidemia   . Dysrhythmia    Atrial Fibrillation  . Edema   . ESRD (end stage renal disease) on dialysis ([place])    "[facility]; [address]; MWF" (07/06/2018)  . Family history of adverse reaction to anesthesia    son also has nausea   . Fever blister 12/2016  . Gastritis and gastroduodenitis   . GERD (gastroesophageal reflux disease)   . Gout   . Hemodialysis-associated hypotension   . HLD (hyperlipidemia)   .  Hypertension   . IBS (irritable bowel syndrome)   . Insomnia   . Iron deficiency anemia   . Junctional bradycardia 08/25/2018  . LFT elevation   . Long term current use of amiodarone 10/17/2016  . Morbid (severe) obesity due to excess calories ([place]) 04/29/2017  . Morbid obesity ([place])   . On home oxygen therapy    "2L; 24/7" (07/06/2018)  . PAF (paroxysmal atrial fibrillation) ([place])   . Peripheral neuropathy   . Pneumonia 12/2016  . PONV (postoperative nausea and vomiting)   . Renal cyst   . Renal insufficiency 10/30/2005   doppler - abn resistance consistent w/ parenchymal disease  . Renovascular hypertension   . Type 2 diabetes mellitus with diabetic neuropathy, with long-term current use of insulin ([place])   . Vertigo     Family History  Problem Relation Age of Onset  . Diabetes Mother   . Heart disease Mother   . Non-Hodgkin's lymphoma Mother   .  Heart attack Father   . Stroke Father   . Parkinson's disease Brother   . Lung disease Brother   . Heart disease Brother   . Multiple myeloma Sister     Past Surgical History:  Procedure Laterality Date  . A/V FISTULAGRAM Left 03/19/2018   Procedure: A/V FISTULAGRAM;  Surgeon: Angelia Mould, MD;  Location: [facility];  Service: Cardiovascular;  Laterality: Left;  . A/V FISTULAGRAM N/A 12/24/2018   Procedure: A/V FISTULAGRAM - Right Arm;  Surgeon: Angelia Mould, MD;  Location: [facility];  Service: Cardiovascular;  Laterality: N/A;  . A/V FISTULAGRAM Right 03/29/2019   Procedure: A/V FISTULAGRAM;  Surgeon: Angelia Mould, MD;  Location: [facility];  Service: Cardiovascular;  Laterality: Right;  . A/V FISTULAGRAM Right 02/03/2020   Procedure: A/V FISTULAGRAM;  Surgeon: Angelia Mould, MD;  Location: [facility];  Service: Cardiovascular;  Laterality: Right;  . A/V SHUNTOGRAM Left 01/19/2018   Procedure: A/V SHUNTOGRAM;  Surgeon: Serafina Mitchell, MD;  Location: [facility];  Service: Cardiovascular;  Laterality: Left;  . ANGIOPLASTY Left 02/12/2015   Procedure: ANGIOPLASTY;  Surgeon: Angelia Mould, MD;  Location: [facility] LAB;  Service: Cardiovascular;  Laterality: Left;  AVF  . AV FISTULA PLACEMENT Left 11/30/2014   Procedure: ARTERIOVENOUS (AV) FISTULA CREATION LEFT ARM;  Surgeon: Angelia Mould, MD;  Location: [facility];  Service: Vascular;  Laterality: Left;  . AV FISTULA PLACEMENT Left 03/20/2015   Procedure:  Inserton of Left Upper Arm Gortex Graft;  Surgeon: Angelia Mould, MD;  Location: [facility];  Service: Vascular;  Laterality: Left;  . AV FISTULA PLACEMENT Left 04/12/2015   Procedure: INSERTION OF LEFT ARM  ARTERIOVENOUS GORE-TEX GRAFT ;  Surgeon: Angelia Mould, MD;  Location: [facility];  Service: Vascular;  Laterality: Left;  . AV FISTULA PLACEMENT Right 06/08/2018   Procedure: INSERTION OF ARTERIOVENOUS (AV) GORE-TEX GRAFT ARM USING 4-7MM X 45CM GORETEX GRAFT;  Surgeon: Angelia Mould, MD;  Location: [facility];  Service: Vascular;  Laterality: Right;  . BACK SURGERY  ~1980  . CATARACT EXTRACTION Right   . CHOLECYSTECTOMY    . ESOPHAGOGASTRODUODENOSCOPY N/A 10/24/2014   Procedure: ESOPHAGOGASTRODUODENOSCOPY (EGD);  Surgeon: Jerene Bears, MD;  Location: [facility];  Service: Endoscopy;  Laterality: N/A;  . EXCHANGE OF A DIALYSIS CATHETER Left 11/30/2014   Procedure: EXCHANGE OF A DIALYSIS CATHETER, LEFT INTERNAL JUGULAR;  Surgeon: Angelia Mould, MD;  Location: [facility];  Service: Vascular;  Laterality: Left;  . EYE SURGERY Left    transplant  . FISTULOGRAM N/A 02/12/2015   Procedure: FISTULOGRAM;  Surgeon: Angelia Mould, MD;  Location: [facility]ATH LAB;  Service: Cardiovascular;  Laterality: N/A;  . HEMORRHOID SURGERY    . IR AV DIALY SHUNT INTRO NEEDLE/INTRACATH INITIAL W/PTA/IMG RIGHT Right 08/25/2018  . IR AV DIALY SHUNT INTRO NEEDLE/INTRACATH INITIAL W/PTA/IMG RIGHT Right 11/17/2018  . IR US GUIDE VASC ACCESS RIGHT   08/25/2018  . LIGATION ARTERIOVENOUS GORTEX GRAFT Left 08/27/2018   Procedure: LIGATION ARTERIOVENOUS GORTEX GRAFT ARM;  Surgeon: Angelia Mould, MD;  Location: [facility];  Service: Vascular;  Laterality: Left;  . LIGATION OF ARTERIOVENOUS  FISTULA Left 03/20/2015   Procedure: LIGATION OF ARTERIOVENOUS  FISTULA;  Surgeon: Angelia Mould, MD;  Location: [facility];  Service: Vascular;  Laterality: Left;  Marland Kitchen MASTECTOMY Bilateral ~1982   bil breast reconstruction with implants  . ORIF TIBIA & FIBULA  FRACTURES Left 2007   also had left non displaced malleolar fracture.   Marland Kitchen PERIPHERAL VASCULAR BALLOON ANGIOPLASTY Left 01/19/2018   Procedure: PERIPHERAL VASCULAR BALLOON ANGIOPLASTY;  Surgeon: Serafina Mitchell, MD;  Location: [facility];  Service: Cardiovascular;  Laterality: Left;  fistula  . PERIPHERAL VASCULAR BALLOON ANGIOPLASTY Left 03/19/2018   Procedure: PERIPHERAL VASCULAR BALLOON ANGIOPLASTY;  Surgeon: Angelia Mould, MD;  Location: [facility];  Service: Cardiovascular;  Laterality: Left;  UPPER ARM FISTULA  . PERIPHERAL VASCULAR BALLOON ANGIOPLASTY Right 12/24/2018   Procedure: PERIPHERAL VASCULAR BALLOON ANGIOPLASTY;  Surgeon: Angelia Mould, MD;  Location: [facility];  Service: Cardiovascular;  Laterality: Right;  arm fistula  . PERIPHERAL VASCULAR BALLOON ANGIOPLASTY  03/29/2019   Procedure: PERIPHERAL VASCULAR BALLOON ANGIOPLASTY;  Surgeon: Angelia Mould, MD;  Location: [facility];  Service: Cardiovascular;;  . PERIPHERAL VASCULAR BALLOON ANGIOPLASTY Right 02/03/2020   Procedure: PERIPHERAL VASCULAR BALLOON ANGIOPLASTY;  Surgeon: Angelia Mould, MD;  Location: [facility];  Service: Cardiovascular;  Laterality: Right;  upper arm fistula  . PERIPHERAL VASCULAR BALLOON ANGIOPLASTY  06/22/2020   Procedure: PERIPHERAL VASCULAR BALLOON ANGIOPLASTY;  Surgeon: Angelia Mould, MD;  Location: [facility];  Service:  Cardiovascular;;  Right AVF  . REMOVAL OF GRAFT Left 04/12/2015   Procedure: REMOVAL OF LEFT ARM ARTERIOVENOUS GORE-TEX GRAFT;  Surgeon: Angelia Mould, MD;  Location: [facility];  Service: Vascular;  Laterality: Left;  . REVISION OF ARTERIOVENOUS GORETEX GRAFT Right 12/13/2019   Procedure: REVISION OF ARTERIOVENOUS GORETEX GRAFT RIGHT FOREARM;  Surgeon: Angelia Mould, MD;  Location: [facility];  Service: Vascular;  Laterality: Right;   Social History   Occupational History  . Not on file  Tobacco Use  . Smoking status: Never Smoker  . Smokeless tobacco: Never Used  Vaping Use  . Vaping Use: Never used  Substance and Sexual Activity  . Alcohol use: No  . Drug use: No  . Sexual activity: Not on file

## 2020-10-04 ENCOUNTER — Ambulatory Visit (INDEPENDENT_AMBULATORY_CARE_PROVIDER_SITE_OTHER): Payer: Medicare Other | Admitting: Physician Assistant

## 2020-10-04 ENCOUNTER — Encounter: Payer: Self-pay | Admitting: Orthopedic Surgery

## 2020-10-04 VITALS — Ht 65.0 in | Wt 114.0 lb

## 2020-10-04 DIAGNOSIS — B351 Tinea unguium: Secondary | ICD-10-CM | POA: Diagnosis not present

## 2020-10-04 DIAGNOSIS — M25561 Pain in right knee: Secondary | ICD-10-CM | POA: Diagnosis not present

## 2020-10-04 DIAGNOSIS — I2510 Atherosclerotic heart disease of native coronary artery without angina pectoris: Secondary | ICD-10-CM

## 2020-10-04 DIAGNOSIS — M1712 Unilateral primary osteoarthritis, left knee: Secondary | ICD-10-CM

## 2020-10-04 MED ORDER — METHYLPREDNISOLONE ACETATE 40 MG/ML IJ SUSP
40.0000 mg | INTRAMUSCULAR | Status: AC | PRN
  Administered 2020-10-04: 40 mg via INTRA_ARTICULAR

## 2020-10-04 MED ORDER — LIDOCAINE HCL 1 % IJ SOLN
5.0000 mL | INTRAMUSCULAR | Status: AC | PRN
  Administered 2020-10-04: 5 mL

## 2020-10-04 NOTE — Progress Notes (Signed)
Office Visit Note   Patient: [name] James           Date of Birth: Dec 08, 1936           MRN: [id number] Visit Date: 10/04/2020              Requested by: Patrick Jupiter [address] [place],  [place] [id number] PCP: [facility]  Chief Complaint  Patient presents with  . Left Knee - Pain  . Right Foot - Nail Problem  . Left Foot - Nail Problem      HPI: Patient is a pleasant 84 year old woman who comes in today for a cortisone injection into her left knee.  She has a long history of traumatic arthritis in this knee and is done well with cortisone injections in the past.  She also comes in for nail trim for onychomycotic nails  Assessment & Plan: Visit Diagnoses: No diagnosis found.  Plan: Patient will follow up in 3 months for another nail trim sooner if any other needs arise  Follow-Up Instructions: No follow-ups on file.   Ortho Exam  Patient is alert, oriented, no adenopathy, well-dressed, normal affect, normal respiratory effort. Left knee: No effusion no erythema no swelling.  She does have a well-healed surgical scar.  Bilateral onychomycotic nails x10  Imaging: No results found. No images are attached to the encounter.  Labs: Lab Results  Component Value Date   HGBA1C 4.4 (L) 08/24/2018   HGBA1C 5.7 (H) 01/20/2017   HGBA1C 5.5 10/22/2016   ESRSEDRATE 64 (H) 07/17/2006   REPTSTATUS 03/16/2019 FINAL 03/14/2019   GRAMSTAIN  10/13/2017    CYTOSPIN SMEAR WBC PRESENT,BOTH PMN AND MONONUCLEAR NO ORGANISMS SEEN Performed at [facility], [place] [address], [place], [place] [address]    CULT >=100,000 COLONIES/mL ESCHERICHIA COLI (A) 03/14/2019   LABORGA ESCHERICHIA COLI (A) 03/14/2019     Lab Results  Component Value Date   ALBUMIN 3.0 (L) 09/14/2020   ALBUMIN 2.6 (L) 08/10/2020   ALBUMIN 2.4 (L) 08/09/2020    Lab Results  Component Value Date   MG 1.9 08/03/2020   MG 2.0 08/01/2020   MG 1.9 08/01/2020   No results found for: VD25OH  No  results found for: PREALBUMIN CBC EXTENDED Latest Ref Rng & Units 09/14/2020 08/10/2020 08/09/2020  WBC 4.0 - 10.5 K/uL 6.0 9.0 6.9  RBC 3.87 - 5.11 MIL/uL 4.45 3.09(L) 2.90(L)  HGB 12.0 - 15.0 g/dL 13.5 9.9(L) 9.5(L)  HCT 36 - 46 % 44.3 33.2(L) 31.5(L)  PLT 150 - 400 K/uL 162 220 199  NEUTROABS 1.7 - 7.7 K/uL 4.0 - -  LYMPHSABS 0.7 - 4.0 K/uL 1.5 - -     Body mass index is 18.97 kg/m.  Orders:  No orders of the defined types were placed in this encounter.  No orders of the defined types were placed in this encounter.    Procedures: Large Joint Inj: L knee on 10/04/2020 10:07 AM Indications: pain and diagnostic evaluation Details: 22 G 1.5 in needle, anterolateral approach  Arthrogram: No  Medications: 40 mg methylPREDNISolone acetate 40 MG/ML; 5 mL lidocaine 1 % Outcome: tolerated well, no immediate complications Procedure, treatment alternatives, risks and benefits explained, specific risks discussed. Consent was given by the patient.      Clinical Data: No additional findings.  ROS:  All other systems negative, except as noted in the HPI. Review of Systems  Objective: Vital Signs: Ht _0  (1.651 m)   Wt 114 lb (51.7  kg)   BMI 18.97 kg/m   Specialty Comments:  No specialty comments available.  PMFS History: Patient Active Problem List   Diagnosis Date Noted  . Atrial fibrillation with RVR ([place]) 08/03/2020  . Nausea and vomiting 03/14/2019  . Chronic diarrhea 03/14/2019  . Dysuria 03/14/2019  . UTI (urinary tract infection) 03/14/2019  . Aortic atherosclerosis ([place]) 12/30/2018  . Hypoalbuminemia due to protein-calorie malnutrition ([place]) 12/30/2018  . Tachycardia-bradycardia syndrome ([place])   . Symptomatic bradycardia 08/24/2018  . Gout 08/23/2018  . Anxiety 08/23/2018  . Pneumothorax, left 10/13/2017  . Multiple lung nodules on CT 10/06/2017  . Pleural effusion, left 10/06/2017  . Diabetic polyneuropathy associated with type 2 diabetes mellitus ([place])  07/30/2017  . Onychomycosis of toenail 07/30/2017  . Idiopathic chronic venous hypertension of both lower extremities with inflammation 07/30/2017  . Chronic respiratory failure with hypoxia ([place]) 04/29/2017  . Advance care planning   . Goals of care, counseling/discussion   . Palliative care by specialist   . Pressure ulcer 01/19/2017  . History of pressure ulcer 10/17/2016  . GAD (generalized anxiety disorder) 10/17/2016  . Osteopenia determined by x-ray 04/17/2016  . Type 2 diabetes mellitus with diabetic neuropathy ([place]) 06/08/2015  . Type 2 diabetes, controlled, with renal manifestation ([place]) 06/08/2015  . PVD (peripheral vascular disease) ([place]) 06/08/2015  . Swelling of limb-Left arm 04/10/2015  . Nausea with vomiting 01/12/2015  . Normocytic anemia 01/12/2015  . Paroxysmal atrial fibrillation ([place]) 12/22/2014  . Melena   . ESRD on dialysis ([place]) 12/19/2014  . Chronic diastolic (congestive) heart failure ([place]) 12/19/2014  . Palpitation 12/19/2014  . Anemia, chronic disease 12/13/2014  . Hypotension 12/13/2014  . Pulmonary edema 11/10/2014  . Physical deconditioning 10/29/2014  . Gastritis and gastroduodenitis 10/24/2014  . Heme positive stool 10/24/2014  . Morbid obesity ([place]) 10/18/2014  . Edema 10/18/2014  . Essential hypertension 10/17/2014  . DM type 2, uncontrolled, with renal complications ([place]) 17/51/0258  . Dyslipidemia 10/17/2014   Past Medical History:  Diagnosis Date  . Anemia, chronic disease   . Anxiety   . Arthritis    knees  . Atrial fibrillation ([place])   . Barrett esophagus   . Benign paroxysmal positional vertigo   . Breast cancer ([place]) 1980s   bil mastectomies, no radiation or chemo  . Cataract   . Chest pain, atypical 12/05/2008   R/Lmv- normal perfusion all regions, noe ECG changes   . CHF (congestive heart failure) ([place]) 05/13/2011   echo - NI>77%; stage 1 diastolic dysfunction; elevated LV filling pressure, MAC  . CKD (chronic kidney  disease), stage III ([place])   . Claudication ([place]) 10/30/2005   doppler - normal evaluation, no evidence of aneurysm, diameter reduction, dissection, compression or vascular abnormality)  . Complication of anesthesia   . Controlled type 2 diabetes mellitus with diabetic polyneuropathy, with long-term current use of insulin ([place]) 04/17/2016  . Dyslipidemia   . Dysrhythmia    Atrial Fibrillation  . Edema   . ESRD (end stage renal disease) on dialysis ([place])    "[facility]; [address]; MWF" (07/06/2018)  . Family history of adverse reaction to anesthesia    son also has nausea   . Fever blister 12/2016  . Gastritis and gastroduodenitis   . GERD (gastroesophageal reflux disease)   . Gout   . Hemodialysis-associated hypotension   . HLD (hyperlipidemia)   . Hypertension   . IBS (irritable bowel syndrome)   . Insomnia   . Iron deficiency anemia   .  Junctional bradycardia 08/25/2018  . LFT elevation   . Long term current use of amiodarone 10/17/2016  . Morbid (severe) obesity due to excess calories ([place]) 04/29/2017  . Morbid obesity ([place])   . On home oxygen therapy    "2L; 24/7" (07/06/2018)  . PAF (paroxysmal atrial fibrillation) ([place])   . Peripheral neuropathy   . Pneumonia 12/2016  . PONV (postoperative nausea and vomiting)   . Renal cyst   . Renal insufficiency 10/30/2005   doppler - abn resistance consistent w/ parenchymal disease  . Renovascular hypertension   . Type 2 diabetes mellitus with diabetic neuropathy, with long-term current use of insulin ([place])   . Vertigo     Family History  Problem Relation Age of Onset  . Diabetes Mother   . Heart disease Mother   . Non-Hodgkin's lymphoma Mother   . Heart attack Father   . Stroke Father   . Parkinson's disease Brother   . Lung disease Brother   . Heart disease Brother   . Multiple myeloma Sister     Past Surgical History:  Procedure Laterality Date  . A/V FISTULAGRAM Left 03/19/2018   Procedure: A/V FISTULAGRAM;  Surgeon: Angelia Mould, MD;  Location: [facility];  Service: Cardiovascular;  Laterality: Left;  . A/V FISTULAGRAM N/A 12/24/2018   Procedure: A/V FISTULAGRAM - Right Arm;  Surgeon: Angelia Mould, MD;  Location: [facility];  Service: Cardiovascular;  Laterality: N/A;  . A/V FISTULAGRAM Right 03/29/2019   Procedure: A/V FISTULAGRAM;  Surgeon: Angelia Mould, MD;  Location: [facility];  Service: Cardiovascular;  Laterality: Right;  . A/V FISTULAGRAM Right 02/03/2020   Procedure: A/V FISTULAGRAM;  Surgeon: Angelia Mould, MD;  Location: [facility];  Service: Cardiovascular;  Laterality: Right;  . A/V SHUNTOGRAM Left 01/19/2018   Procedure: A/V SHUNTOGRAM;  Surgeon: Serafina Mitchell, MD;  Location: [facility];  Service: Cardiovascular;  Laterality: Left;  . ANGIOPLASTY Left 02/12/2015   Procedure: ANGIOPLASTY;  Surgeon: Angelia Mould, MD;  Location: [facility]ATH LAB;  Service: Cardiovascular;  Laterality: Left;  AVF  . AV FISTULA PLACEMENT Left 11/30/2014   Procedure: ARTERIOVENOUS (AV) FISTULA CREATION LEFT ARM;  Surgeon: Angelia Mould, MD;  Location: [facility];  Service: Vascular;  Laterality: Left;  . AV FISTULA PLACEMENT Left 03/20/2015   Procedure:  Inserton of Left Upper Arm Gortex Graft;  Surgeon: Angelia Mould, MD;  Location: [facility];  Service: Vascular;  Laterality: Left;  . AV FISTULA PLACEMENT Left 04/12/2015   Procedure: INSERTION OF LEFT ARM  ARTERIOVENOUS GORE-TEX GRAFT ;  Surgeon: Angelia Mould, MD;  Location: [facility];  Service: Vascular;  Laterality: Left;  . AV FISTULA PLACEMENT Right 06/08/2018   Procedure: INSERTION OF ARTERIOVENOUS (AV) GORE-TEX GRAFT ARM USING 4-7MM X 45CM GORETEX GRAFT;  Surgeon: Angelia Mould, MD;  Location: [facility];  Service: Vascular;  Laterality: Right;  . BACK SURGERY  ~1980  . CATARACT EXTRACTION Right   . CHOLECYSTECTOMY    . ESOPHAGOGASTRODUODENOSCOPY N/A 10/24/2014   Procedure:  ESOPHAGOGASTRODUODENOSCOPY (EGD);  Surgeon: Jerene Bears, MD;  Location: [facility] ENDOSCOPY;  Service: Endoscopy;  Laterality: N/A;  . EXCHANGE OF A DIALYSIS CATHETER Left 11/30/2014   Procedure: EXCHANGE OF A DIALYSIS CATHETER, LEFT INTERNAL JUGULAR;  Surgeon: Angelia Mould, MD;  Location: [facility];  Service: Vascular;  Laterality: Left;  . EYE SURGERY Left    transplant  . FISTULOGRAM N/A  02/12/2015   Procedure: FISTULOGRAM;  Surgeon: Angelia Mould, MD;  Location: [facility] CATH LAB;  Service: Cardiovascular;  Laterality: N/A;  . HEMORRHOID SURGERY    . IR AV DIALY SHUNT INTRO NEEDLE/INTRACATH INITIAL W/PTA/IMG RIGHT Right 08/25/2018  . IR AV DIALY SHUNT INTRO NEEDLE/INTRACATH INITIAL W/PTA/IMG RIGHT Right 11/17/2018  . IR US GUIDE VASC ACCESS RIGHT  08/25/2018  . LIGATION ARTERIOVENOUS GORTEX GRAFT Left 08/27/2018   Procedure: LIGATION ARTERIOVENOUS GORTEX GRAFT ARM;  Surgeon: Angelia Mould, MD;  Location: [facility];  Service: Vascular;  Laterality: Left;  . LIGATION OF ARTERIOVENOUS  FISTULA Left 03/20/2015   Procedure: LIGATION OF ARTERIOVENOUS  FISTULA;  Surgeon: Angelia Mould, MD;  Location: [facility];  Service: Vascular;  Laterality: Left;  Marland Kitchen MASTECTOMY Bilateral ~1982   bil breast reconstruction with implants  . ORIF TIBIA & FIBULA FRACTURES Left 2007   also had left non displaced malleolar fracture.   Marland Kitchen PERIPHERAL VASCULAR BALLOON ANGIOPLASTY Left 01/19/2018   Procedure: PERIPHERAL VASCULAR BALLOON ANGIOPLASTY;  Surgeon: Serafina Mitchell, MD;  Location: [facility];  Service: Cardiovascular;  Laterality: Left;  fistula  . PERIPHERAL VASCULAR BALLOON ANGIOPLASTY Left 03/19/2018   Procedure: PERIPHERAL VASCULAR BALLOON ANGIOPLASTY;  Surgeon: Angelia Mould, MD;  Location: [facility];  Service: Cardiovascular;  Laterality: Left;  UPPER ARM FISTULA  . PERIPHERAL VASCULAR BALLOON ANGIOPLASTY Right 12/24/2018   Procedure: PERIPHERAL VASCULAR BALLOON ANGIOPLASTY;   Surgeon: Angelia Mould, MD;  Location: [facility];  Service: Cardiovascular;  Laterality: Right;  arm fistula  . PERIPHERAL VASCULAR BALLOON ANGIOPLASTY  03/29/2019   Procedure: PERIPHERAL VASCULAR BALLOON ANGIOPLASTY;  Surgeon: Angelia Mould, MD;  Location: [facility];  Service: Cardiovascular;;  . PERIPHERAL VASCULAR BALLOON ANGIOPLASTY Right 02/03/2020   Procedure: PERIPHERAL VASCULAR BALLOON ANGIOPLASTY;  Surgeon: Angelia Mould, MD;  Location: [facility];  Service: Cardiovascular;  Laterality: Right;  upper arm fistula  . PERIPHERAL VASCULAR BALLOON ANGIOPLASTY  06/22/2020   Procedure: PERIPHERAL VASCULAR BALLOON ANGIOPLASTY;  Surgeon: Angelia Mould, MD;  Location: [facility];  Service: Cardiovascular;;  Right AVF  . REMOVAL OF GRAFT Left 04/12/2015   Procedure: REMOVAL OF LEFT ARM ARTERIOVENOUS GORE-TEX GRAFT;  Surgeon: Angelia Mould, MD;  Location: [facility];  Service: Vascular;  Laterality: Left;  . REVISION OF ARTERIOVENOUS GORETEX GRAFT Right 12/13/2019   Procedure: REVISION OF ARTERIOVENOUS GORETEX GRAFT RIGHT FOREARM;  Surgeon: Angelia Mould, MD;  Location: [facility];  Service: Vascular;  Laterality: Right;   Social History   Occupational History  . Not on file  Tobacco Use  . Smoking status: Never Smoker  . Smokeless tobacco: Never Used  Vaping Use  . Vaping Use: Never used  Substance and Sexual Activity  . Alcohol use: No  . Drug use: No  . Sexual activity: Not on file

## 2020-10-22 DIAGNOSIS — R112 Nausea with vomiting, unspecified: Secondary | ICD-10-CM

## 2020-10-22 HISTORY — DX: Nausea with vomiting, unspecified: R11.2

## 2020-10-29 ENCOUNTER — Other Ambulatory Visit: Payer: Self-pay

## 2020-10-29 ENCOUNTER — Emergency Department (HOSPITAL_COMMUNITY): Payer: Medicare Other

## 2020-10-29 ENCOUNTER — Observation Stay (HOSPITAL_COMMUNITY): Payer: Medicare Other

## 2020-10-29 ENCOUNTER — Inpatient Hospital Stay (HOSPITAL_COMMUNITY)
Admission: EM | Admit: 2020-10-29 | Discharge: 2020-11-02 | DRG: 391 | Disposition: A | Discharge status: Skilled Nursing Facility | Payer: Medicare Other | Source: Skilled Nursing Facility | Attending: Internal Medicine | Admitting: Internal Medicine

## 2020-10-29 ENCOUNTER — Encounter (HOSPITAL_COMMUNITY): Payer: Self-pay

## 2020-10-29 DIAGNOSIS — K580 Irritable bowel syndrome with diarrhea: Secondary | ICD-10-CM | POA: Diagnosis not present

## 2020-10-29 DIAGNOSIS — Z681 Body mass index (BMI) 19 or less, adult: Secondary | ICD-10-CM

## 2020-10-29 DIAGNOSIS — N200 Calculus of kidney: Secondary | ICD-10-CM | POA: Diagnosis present

## 2020-10-29 DIAGNOSIS — R1110 Vomiting, unspecified: Secondary | ICD-10-CM

## 2020-10-29 DIAGNOSIS — Z885 Allergy status to narcotic agent status: Secondary | ICD-10-CM

## 2020-10-29 DIAGNOSIS — K5900 Constipation, unspecified: Secondary | ICD-10-CM

## 2020-10-29 DIAGNOSIS — Z853 Personal history of malignant neoplasm of breast: Secondary | ICD-10-CM

## 2020-10-29 DIAGNOSIS — Z794 Long term (current) use of insulin: Secondary | ICD-10-CM

## 2020-10-29 DIAGNOSIS — Z823 Family history of stroke: Secondary | ICD-10-CM

## 2020-10-29 DIAGNOSIS — F419 Anxiety disorder, unspecified: Secondary | ICD-10-CM | POA: Diagnosis present

## 2020-10-29 DIAGNOSIS — E1142 Type 2 diabetes mellitus with diabetic polyneuropathy: Secondary | ICD-10-CM | POA: Diagnosis not present

## 2020-10-29 DIAGNOSIS — Z807 Family history of other malignant neoplasms of lymphoid, hematopoietic and related tissues: Secondary | ICD-10-CM

## 2020-10-29 DIAGNOSIS — Z833 Family history of diabetes mellitus: Secondary | ICD-10-CM

## 2020-10-29 DIAGNOSIS — J9611 Chronic respiratory failure with hypoxia: Secondary | ICD-10-CM | POA: Diagnosis present

## 2020-10-29 DIAGNOSIS — Z79899 Other long term (current) drug therapy: Secondary | ICD-10-CM

## 2020-10-29 DIAGNOSIS — Z8249 Family history of ischemic heart disease and other diseases of the circulatory system: Secondary | ICD-10-CM

## 2020-10-29 DIAGNOSIS — I480 Paroxysmal atrial fibrillation: Secondary | ICD-10-CM | POA: Diagnosis present

## 2020-10-29 DIAGNOSIS — Z9013 Acquired absence of bilateral breasts and nipples: Secondary | ICD-10-CM

## 2020-10-29 DIAGNOSIS — Z880 Allergy status to penicillin: Secondary | ICD-10-CM

## 2020-10-29 DIAGNOSIS — Z9981 Dependence on supplemental oxygen: Secondary | ICD-10-CM

## 2020-10-29 DIAGNOSIS — L89152 Pressure ulcer of sacral region, stage 2: Secondary | ICD-10-CM | POA: Diagnosis present

## 2020-10-29 DIAGNOSIS — R112 Nausea with vomiting, unspecified: Secondary | ICD-10-CM | POA: Diagnosis not present

## 2020-10-29 DIAGNOSIS — E1122 Type 2 diabetes mellitus with diabetic chronic kidney disease: Secondary | ICD-10-CM | POA: Diagnosis present

## 2020-10-29 DIAGNOSIS — I700 Atherosclerosis of aorta: Secondary | ICD-10-CM | POA: Diagnosis present

## 2020-10-29 DIAGNOSIS — I5032 Chronic diastolic (congestive) heart failure: Secondary | ICD-10-CM | POA: Diagnosis present

## 2020-10-29 DIAGNOSIS — K219 Gastro-esophageal reflux disease without esophagitis: Secondary | ICD-10-CM | POA: Diagnosis present

## 2020-10-29 DIAGNOSIS — I9589 Other hypotension: Secondary | ICD-10-CM | POA: Diagnosis present

## 2020-10-29 DIAGNOSIS — N186 End stage renal disease: Secondary | ICD-10-CM | POA: Diagnosis present

## 2020-10-29 DIAGNOSIS — D72829 Elevated white blood cell count, unspecified: Secondary | ICD-10-CM | POA: Diagnosis present

## 2020-10-29 DIAGNOSIS — Z820 Family history of epilepsy and other diseases of the nervous system: Secondary | ICD-10-CM

## 2020-10-29 DIAGNOSIS — E1151 Type 2 diabetes mellitus with diabetic peripheral angiopathy without gangrene: Secondary | ICD-10-CM | POA: Diagnosis present

## 2020-10-29 DIAGNOSIS — E875 Hyperkalemia: Secondary | ICD-10-CM | POA: Diagnosis present

## 2020-10-29 DIAGNOSIS — J9811 Atelectasis: Secondary | ICD-10-CM | POA: Diagnosis present

## 2020-10-29 DIAGNOSIS — I959 Hypotension, unspecified: Secondary | ICD-10-CM | POA: Diagnosis present

## 2020-10-29 DIAGNOSIS — I10 Essential (primary) hypertension: Secondary | ICD-10-CM | POA: Diagnosis present

## 2020-10-29 DIAGNOSIS — E785 Hyperlipidemia, unspecified: Secondary | ICD-10-CM | POA: Diagnosis present

## 2020-10-29 DIAGNOSIS — N2581 Secondary hyperparathyroidism of renal origin: Secondary | ICD-10-CM | POA: Diagnosis present

## 2020-10-29 DIAGNOSIS — Z20822 Contact with and (suspected) exposure to covid-19: Secondary | ICD-10-CM | POA: Diagnosis present

## 2020-10-29 DIAGNOSIS — Z992 Dependence on renal dialysis: Secondary | ICD-10-CM

## 2020-10-29 DIAGNOSIS — Z7982 Long term (current) use of aspirin: Secondary | ICD-10-CM

## 2020-10-29 DIAGNOSIS — IMO0002 Reserved for concepts with insufficient information to code with codable children: Secondary | ICD-10-CM | POA: Diagnosis present

## 2020-10-29 DIAGNOSIS — Z888 Allergy status to other drugs, medicaments and biological substances status: Secondary | ICD-10-CM

## 2020-10-29 DIAGNOSIS — K2270 Barrett's esophagus without dysplasia: Secondary | ICD-10-CM | POA: Diagnosis present

## 2020-10-29 DIAGNOSIS — E1165 Type 2 diabetes mellitus with hyperglycemia: Secondary | ICD-10-CM | POA: Diagnosis present

## 2020-10-29 DIAGNOSIS — D631 Anemia in chronic kidney disease: Secondary | ICD-10-CM | POA: Diagnosis present

## 2020-10-29 DIAGNOSIS — Z7401 Bed confinement status: Secondary | ICD-10-CM

## 2020-10-29 HISTORY — DX: Unspecified abdominal pain: R10.9

## 2020-10-29 LAB — COMPREHENSIVE METABOLIC PANEL
ALT: 19 U/L (ref 0–44)
AST: 21 U/L (ref 15–41)
Albumin: 2.4 g/dL — ABNORMAL LOW (ref 3.5–5.0)
Alkaline Phosphatase: 88 U/L (ref 38–126)
Anion gap: 19 — ABNORMAL HIGH (ref 5–15)
BUN: 66 mg/dL — ABNORMAL HIGH (ref 8–23)
CO2: 22 mmol/L (ref 22–32)
Calcium: 8.3 mg/dL — ABNORMAL LOW (ref 8.9–10.3)
Chloride: 97 mmol/L — ABNORMAL LOW (ref 98–111)
Creatinine, Ser: 6.28 mg/dL — ABNORMAL HIGH (ref 0.44–1.00)
GFR, Estimated: 6 mL/min — ABNORMAL LOW (ref >60)
Glucose, Bld: 155 mg/dL — ABNORMAL HIGH (ref 70–99)
Potassium: 5.6 mmol/L — ABNORMAL HIGH (ref 3.5–5.1)
Sodium: 138 mmol/L (ref 135–145)
Total Bilirubin: 1.2 mg/dL (ref 0.3–1.2)
Total Protein: 6.4 g/dL — ABNORMAL LOW (ref 6.5–8.1)

## 2020-10-29 LAB — POC OCCULT BLOOD, ED: Fecal Occult Bld: POSITIVE — AB

## 2020-10-29 LAB — CBC WITH DIFFERENTIAL/PLATELET
Abs Immature Granulocytes: 0.1 10*3/uL — ABNORMAL HIGH (ref 0.00–0.07)
Basophils Absolute: 0 10*3/uL (ref 0.0–0.1)
Basophils Relative: 0 %
Eosinophils Absolute: 0 10*3/uL (ref 0.0–0.5)
Eosinophils Relative: 0 %
HCT: 38.1 % (ref 36.0–46.0)
Hemoglobin: 12.1 g/dL (ref 12.0–15.0)
Immature Granulocytes: 1 %
Lymphocytes Relative: 6 %
Lymphs Abs: 0.9 10*3/uL (ref 0.7–4.0)
MCH: 30.6 pg (ref 26.0–34.0)
MCHC: 31.8 g/dL (ref 30.0–36.0)
MCV: 96.5 fL (ref 80.0–100.0)
Monocytes Absolute: 0.5 10*3/uL (ref 0.1–1.0)
Monocytes Relative: 3 %
Neutro Abs: 14.1 10*3/uL — ABNORMAL HIGH (ref 1.7–7.7)
Neutrophils Relative %: 90 %
Platelets: 246 10*3/uL (ref 150–400)
RBC: 3.95 MIL/uL (ref 3.87–5.11)
RDW: 18.6 % — ABNORMAL HIGH (ref 11.5–15.5)
WBC: 15.7 10*3/uL — ABNORMAL HIGH (ref 4.0–10.5)
nRBC: 0 % (ref 0.0–0.2)

## 2020-10-29 LAB — CREATININE, SERUM
Creatinine, Ser: 6.21 mg/dL — ABNORMAL HIGH (ref 0.44–1.00)
GFR, Estimated: 6 mL/min — ABNORMAL LOW (ref >60)

## 2020-10-29 LAB — MAGNESIUM: Magnesium: 2.2 mg/dL (ref 1.7–2.4)

## 2020-10-29 LAB — RESPIRATORY PANEL BY RT PCR (FLU A&B, COVID)
Influenza A by PCR: NEGATIVE
Influenza B by PCR: NEGATIVE
SARS Coronavirus 2 by RT PCR: NEGATIVE

## 2020-10-29 LAB — LIPASE, BLOOD: Lipase: 26 U/L (ref 11–51)

## 2020-10-29 MED ORDER — PANTOPRAZOLE SODIUM 40 MG IV SOLR
40.0000 mg | Freq: Two times a day (BID) | INTRAVENOUS | Status: DC
  Administered 2020-10-30 – 2020-10-31 (×5): 40 mg via INTRAVENOUS
  Filled 2020-10-29 (×4): qty 40

## 2020-10-29 MED ORDER — ACETAMINOPHEN 325 MG PO TABS: 650.0000 mg | ORAL_TABLET | Freq: Four times a day (QID) | ORAL | Status: DC | PRN

## 2020-10-29 MED ORDER — MIDODRINE HCL 5 MG PO TABS: 10.0000 mg | ORAL_TABLET | ORAL | Status: DC

## 2020-10-29 MED ORDER — ONDANSETRON HCL 4 MG PO TABS: 4.0000 mg | ORAL_TABLET | Freq: Four times a day (QID) | ORAL | Status: DC | PRN

## 2020-10-29 MED ORDER — MILK AND MOLASSES ENEMA
1.0000 | Freq: Once | RECTAL | Status: DC
  Filled 2020-10-29 (×2): qty 240

## 2020-10-29 MED ORDER — SODIUM ZIRCONIUM CYCLOSILICATE 5 G PO PACK
5.0000 g | PACK | Freq: Once | ORAL | Status: AC
  Administered 2020-10-29: 5 g via ORAL
  Filled 2020-10-29: qty 1

## 2020-10-29 MED ORDER — IPRATROPIUM BROMIDE 0.06 % NA SOLN
2.0000 | Freq: Two times a day (BID) | NASAL | Status: DC
  Administered 2020-10-30 – 2020-11-02 (×7): 2 via NASAL
  Filled 2020-10-29: qty 15
  Filled 2020-10-29 (×2): qty 30
  Filled 2020-10-29: qty 15

## 2020-10-29 MED ORDER — BRIMONIDINE TARTRATE 0.2 % OP SOLN
1.0000 [drp] | Freq: Two times a day (BID) | OPHTHALMIC | Status: DC
  Administered 2020-10-30 – 2020-11-01 (×5): 1 [drp] via OPHTHALMIC
  Filled 2020-10-29 (×2): qty 5

## 2020-10-29 MED ORDER — ONDANSETRON HCL 4 MG/2ML IJ SOLN
4.0000 mg | Freq: Once | INTRAMUSCULAR | Status: AC
  Administered 2020-10-29: 4 mg via INTRAVENOUS
  Filled 2020-10-29: qty 2

## 2020-10-29 MED ORDER — POLYETHYLENE GLYCOL 3350 17 G PO PACK
17.0000 g | PACK | Freq: Two times a day (BID) | ORAL | Status: DC
  Administered 2020-10-30 – 2020-11-01 (×4): 17 g via ORAL
  Filled 2020-10-29 (×7): qty 1

## 2020-10-29 MED ORDER — LORATADINE 10 MG PO TABS
10.0000 mg | ORAL_TABLET | Freq: Every day | ORAL | Status: DC
  Administered 2020-10-30 – 2020-11-02 (×5): 10 mg via ORAL
  Filled 2020-10-29 (×5): qty 1

## 2020-10-29 MED ORDER — BRINZOLAMIDE 1 % OP SUSP
1.0000 [drp] | Freq: Two times a day (BID) | OPHTHALMIC | Status: DC
  Administered 2020-10-30 – 2020-11-02 (×7): 1 [drp] via OPHTHALMIC
  Filled 2020-10-29 (×2): qty 10

## 2020-10-29 MED ORDER — TRAMADOL HCL 50 MG PO TABS: 50.0000 mg | ORAL_TABLET | ORAL | Status: DC

## 2020-10-29 MED ORDER — AMIODARONE HCL 200 MG PO TABS
200.0000 mg | ORAL_TABLET | Freq: Every day | ORAL | Status: DC
  Administered 2020-10-30 – 2020-11-02 (×5): 200 mg via ORAL
  Filled 2020-10-29 (×5): qty 1

## 2020-10-29 MED ORDER — MIDODRINE HCL 5 MG PO TABS
2.5000 mg | ORAL_TABLET | Freq: Three times a day (TID) | ORAL | Status: DC
  Administered 2020-10-30 – 2020-11-01 (×7): 2.5 mg via ORAL
  Filled 2020-10-29 (×10): qty 1

## 2020-10-29 MED ORDER — MUSCLE RUB 10-15 % EX CREA
TOPICAL_CREAM | Freq: Three times a day (TID) | CUTANEOUS | Status: DC | PRN
  Filled 2020-10-29: qty 85

## 2020-10-29 MED ORDER — CHLORHEXIDINE GLUCONATE CLOTH 2 % EX PADS: 6.0000 | MEDICATED_PAD | Freq: Every day | CUTANEOUS | Status: DC

## 2020-10-29 MED ORDER — RENA-VITE PO TABS
1.0000 | ORAL_TABLET | Freq: Every day | ORAL | Status: DC
  Administered 2020-10-31 – 2020-11-01 (×3): 1 via ORAL
  Filled 2020-10-29 (×4): qty 1

## 2020-10-29 MED ORDER — TRAZODONE HCL 50 MG PO TABS
50.0000 mg | ORAL_TABLET | Freq: Every day | ORAL | Status: DC
  Administered 2020-10-30 – 2020-11-01 (×4): 50 mg via ORAL
  Filled 2020-10-29 (×4): qty 1

## 2020-10-29 MED ORDER — ASCORBIC ACID 500 MG PO TABS
500.0000 mg | ORAL_TABLET | Freq: Every day | ORAL | Status: DC
  Administered 2020-10-30 – 2020-11-02 (×5): 500 mg via ORAL
  Filled 2020-10-29 (×5): qty 1

## 2020-10-29 MED ORDER — HEPARIN SODIUM (PORCINE) 5000 UNIT/ML IJ SOLN
5000.0000 [IU] | Freq: Three times a day (TID) | INTRAMUSCULAR | Status: DC
  Administered 2020-10-30 (×2): 5000 [IU] via SUBCUTANEOUS
  Filled 2020-10-29 (×2): qty 1

## 2020-10-29 MED ORDER — COLLAGENASE 250 UNIT/GM EX OINT
1.0000 "application " | TOPICAL_OINTMENT | Freq: Every day | CUTANEOUS | Status: DC
  Administered 2020-10-30 – 2020-11-02 (×4): 1 via TOPICAL
  Filled 2020-10-29 (×2): qty 30

## 2020-10-29 MED ORDER — ONDANSETRON HCL 4 MG/2ML IJ SOLN
4.0000 mg | Freq: Four times a day (QID) | INTRAMUSCULAR | Status: DC | PRN
  Administered 2020-10-30 – 2020-11-01 (×3): 4 mg via INTRAVENOUS
  Filled 2020-10-29 (×3): qty 2

## 2020-10-29 MED ORDER — HYDRALAZINE HCL 25 MG PO TABS: 25.0000 mg | ORAL_TABLET | Freq: Three times a day (TID) | ORAL | Status: DC | PRN

## 2020-10-29 MED ORDER — BISACODYL 10 MG RE SUPP
10.0000 mg | Freq: Once | RECTAL | Status: AC
  Administered 2020-10-29: 10 mg via RECTAL
  Filled 2020-10-29: qty 1

## 2020-10-29 MED ORDER — FLUTICASONE PROPIONATE 50 MCG/ACT NA SUSP
2.0000 | Freq: Two times a day (BID) | NASAL | Status: DC
  Administered 2020-10-30 – 2020-11-02 (×7): 2 via NASAL
  Filled 2020-10-29 (×2): qty 16

## 2020-10-29 MED ORDER — ALLOPURINOL 100 MG PO TABS
100.0000 mg | ORAL_TABLET | Freq: Every day | ORAL | Status: DC
  Administered 2020-10-30 – 2020-11-02 (×4): 100 mg via ORAL
  Filled 2020-10-29 (×4): qty 1

## 2020-10-29 MED ORDER — DIAZEPAM 5 MG PO TABS
5.0000 mg | ORAL_TABLET | Freq: Every day | ORAL | Status: DC
  Administered 2020-10-30 – 2020-11-01 (×4): 5 mg via ORAL
  Filled 2020-10-29 (×4): qty 1

## 2020-10-29 MED ORDER — ACETAMINOPHEN 325 MG PO TABS
650.0000 mg | ORAL_TABLET | Freq: Once | ORAL | Status: AC
  Administered 2020-10-29: 650 mg via ORAL
  Filled 2020-10-29: qty 2

## 2020-10-29 MED ORDER — CALCIUM CARBONATE ANTACID 500 MG PO CHEW
1.0000 | CHEWABLE_TABLET | Freq: Two times a day (BID) | ORAL | Status: DC
  Administered 2020-10-30 – 2020-11-02 (×8): 200 mg via ORAL
  Filled 2020-10-29 (×7): qty 1

## 2020-10-29 MED ORDER — MELATONIN 5 MG PO TABS
10.0000 mg | ORAL_TABLET | Freq: Every day | ORAL | Status: DC
  Administered 2020-10-30 – 2020-11-01 (×4): 10 mg via ORAL
  Filled 2020-10-29 (×6): qty 2

## 2020-10-29 MED ORDER — DICLOFENAC SODIUM 1 % EX GEL
2.0000 g | Freq: Every day | CUTANEOUS | Status: DC
  Administered 2020-11-01 (×2): 2 g via TOPICAL
  Filled 2020-10-29: qty 100

## 2020-10-29 MED ORDER — SENNA 8.6 MG PO TABS
1.0000 | ORAL_TABLET | Freq: Two times a day (BID) | ORAL | Status: DC
  Administered 2020-10-30 – 2020-11-02 (×7): 8.6 mg via ORAL
  Filled 2020-10-29 (×8): qty 1

## 2020-10-29 MED ORDER — ASPIRIN 81 MG PO CHEW
81.0000 mg | CHEWABLE_TABLET | Freq: Every day | ORAL | Status: DC
  Administered 2020-10-30 – 2020-11-02 (×5): 81 mg via ORAL
  Filled 2020-10-29 (×5): qty 1

## 2020-10-29 MED ORDER — SORBITOL 70 % SOLN
30.0000 mL | Freq: Every day | Status: DC | PRN
  Filled 2020-10-29: qty 30

## 2020-10-29 MED ORDER — MENTHOL (TOPICAL ANALGESIC) 4 % EX GEL: 1.0000 "application " | Freq: Three times a day (TID) | CUTANEOUS | Status: DC | PRN

## 2020-10-29 NOTE — ED Notes (Signed)
Pt has BM after sitting on bed pan. Soft, loosely formed, ragged edges. Green-grey. Brief applied.

## 2020-10-29 NOTE — H&P (Addendum)
History and Physical  [name] James ENI:[id number] DOB: 1936/10/28 DOA: 10/29/2020  PCP: Patrick Jupiter Patient coming from: SNF  I have personally briefly reviewed patient's old medical records in [place]   Chief Complaint: Nausea, vomiting, constipation  HPI: [name] James is a 84 y.o. female PMH significant for atrial fibrillation, ESRD on hemodialysis, CHF, chronic hypoxic respiratory failure, who presents with several days of nausea, vomiting and constipation.  Last bowel movement almost a week ago.  Patient report rectal pain.  She denies chest pain, worsening shortness of breath.  She reports abdominal pain. She report poor oral intake.  She report vomiting on and off sometimes after hemodialysis.  But her vomiting has been worse lately.  Evaluation in the ED: Potassium 5.6, creatinine 6.2, hemoglobin 12.1, white blood cell 15.7 respiratory panel COVID-19 negative, occult blood positive.  CT abdomen and pelvis:Large amount of stool within the distal colon and rectum. 2. Chronic bilateral pleural effusions and left lower lobe atelectasis. Bilobed complex hypodensity upper pole left kidney, likely hemorrhagic cyst. Nonobstructing 5 mm left renal calculus.  She is bedbound.   Review of Systems: All systems reviewed and apart from history of presenting illness, are negative.  Past Medical History:  Diagnosis Date  . Anemia, chronic disease   . Anxiety   . Arthritis    knees  . Atrial fibrillation ([place])   . Barrett esophagus   . Benign paroxysmal positional vertigo   . Breast cancer ([place]) 1980s   bil mastectomies, no radiation or chemo  . Cataract   . Chest pain, atypical 12/05/2008   R/Lmv- normal perfusion all regions, noe ECG changes   . CHF (congestive heart failure) ([place]) 05/13/2011   echo - IR>44%; stage 1 diastolic dysfunction; elevated LV filling pressure, MAC  . CKD (chronic kidney disease), stage III ([place])   . Claudication ([place]) 10/30/2005   doppler -  normal evaluation, no evidence of aneurysm, diameter reduction, dissection, compression or vascular abnormality)  . Complication of anesthesia   . Controlled type 2 diabetes mellitus with diabetic polyneuropathy, with long-term current use of insulin ([place]) 04/17/2016  . Dyslipidemia   . Dysrhythmia    Atrial Fibrillation  . Edema   . ESRD (end stage renal disease) on dialysis ([place])    "[facility]; [address]; MWF" (07/06/2018)  . Family history of adverse reaction to anesthesia    son also has nausea   . Fever blister 12/2016  . Gastritis and gastroduodenitis   . GERD (gastroesophageal reflux disease)   . Gout   . Hemodialysis-associated hypotension   . HLD (hyperlipidemia)   . Hypertension   . IBS (irritable bowel syndrome)   . Insomnia   . Iron deficiency anemia   . Junctional bradycardia 08/25/2018  . LFT elevation   . Long term current use of amiodarone 10/17/2016  . Morbid (severe) obesity due to excess calories ([place]) 04/29/2017  . Morbid obesity ([place])   . On home oxygen therapy    "2L; 24/7" (07/06/2018)  . PAF (paroxysmal atrial fibrillation) ([place])   . Peripheral neuropathy   . Pneumonia 12/2016  . PONV (postoperative nausea and vomiting)   . Renal cyst   . Renal insufficiency 10/30/2005   doppler - abn resistance consistent w/ parenchymal disease  . Renovascular hypertension   . Type 2 diabetes mellitus with diabetic neuropathy, with long-term current use of insulin ([place])   . Vertigo    Past Surgical History:  Procedure Laterality Date  . A/V  FISTULAGRAM Left 03/19/2018   Procedure: A/V FISTULAGRAM;  Surgeon: Angelia Mould, MD;  Location: [place] [facility];  Service: Cardiovascular;  Laterality: Left;  . A/V FISTULAGRAM N/A 12/24/2018   Procedure: A/V FISTULAGRAM - Right Arm;  Surgeon: Angelia Mould, MD;  Location: [place] [facility];  Service: Cardiovascular;  Laterality: N/A;  . A/V FISTULAGRAM Right 03/29/2019   Procedure: A/V FISTULAGRAM;  Surgeon:  Angelia Mould, MD;  Location: [place] [facility];  Service: Cardiovascular;  Laterality: Right;  . A/V FISTULAGRAM Right 02/03/2020   Procedure: A/V FISTULAGRAM;  Surgeon: Angelia Mould, MD;  Location: [place] [facility];  Service: Cardiovascular;  Laterality: Right;  . A/V SHUNTOGRAM Left 01/19/2018   Procedure: A/V SHUNTOGRAM;  Surgeon: Serafina Mitchell, MD;  Location: [place] [facility];  Service: Cardiovascular;  Laterality: Left;  . ANGIOPLASTY Left 02/12/2015   Procedure: ANGIOPLASTY;  Surgeon: Angelia Mould, MD;  Location: [facility] CATH LAB;  Service: Cardiovascular;  Laterality: Left;  AVF  . AV FISTULA PLACEMENT Left 11/30/2014   Procedure: ARTERIOVENOUS (AV) FISTULA CREATION LEFT ARM;  Surgeon: Angelia Mould, MD;  Location: [place];  Service: Vascular;  Laterality: Left;  . AV FISTULA PLACEMENT Left 03/20/2015   Procedure:  Inserton of Left Upper Arm Gortex Graft;  Surgeon: Angelia Mould, MD;  Location: [place];  Service: Vascular;  Laterality: Left;  . AV FISTULA PLACEMENT Left 04/12/2015   Procedure: INSERTION OF LEFT ARM  ARTERIOVENOUS GORE-TEX GRAFT ;  Surgeon: Angelia Mould, MD;  Location: [place];  Service: Vascular;  Laterality: Left;  . AV FISTULA PLACEMENT Right 06/08/2018   Procedure: INSERTION OF ARTERIOVENOUS (AV) GORE-TEX GRAFT ARM USING 4-7MM X 45CM GORETEX GRAFT;  Surgeon: Angelia Mould, MD;  Location: [place];  Service: Vascular;  Laterality: Right;  . BACK SURGERY  ~1980  . CATARACT EXTRACTION Right   . CHOLECYSTECTOMY    . ESOPHAGOGASTRODUODENOSCOPY N/A 10/24/2014   Procedure: ESOPHAGOGASTRODUODENOSCOPY (EGD);  Surgeon: Jerene Bears, MD;  Location: [facility] ENDOSCOPY;  Service: Endoscopy;  Laterality: N/A;  . EXCHANGE OF A DIALYSIS CATHETER Left 11/30/2014   Procedure: EXCHANGE OF A DIALYSIS CATHETER, LEFT INTERNAL JUGULAR;  Surgeon: Angelia Mould, MD;  Location: [place];  Service: Vascular;  Laterality: Left;  . EYE SURGERY Left     transplant  . FISTULOGRAM N/A 02/12/2015   Procedure: FISTULOGRAM;  Surgeon: Angelia Mould, MD;  Location: [facility] CATH LAB;  Service: Cardiovascular;  Laterality: N/A;  . HEMORRHOID SURGERY    . IR AV DIALY SHUNT INTRO NEEDLE/INTRACATH INITIAL W/PTA/IMG RIGHT Right 08/25/2018  . IR AV DIALY SHUNT INTRO NEEDLE/INTRACATH INITIAL W/PTA/IMG RIGHT Right 11/17/2018  . IR US GUIDE VASC ACCESS RIGHT  08/25/2018  . LIGATION ARTERIOVENOUS GORTEX GRAFT Left 08/27/2018   Procedure: LIGATION ARTERIOVENOUS GORTEX GRAFT ARM;  Surgeon: Angelia Mould, MD;  Location: [place];  Service: Vascular;  Laterality: Left;  . LIGATION OF ARTERIOVENOUS  FISTULA Left 03/20/2015   Procedure: LIGATION OF ARTERIOVENOUS  FISTULA;  Surgeon: Angelia Mould, MD;  Location: [place];  Service: Vascular;  Laterality: Left;  Marland Kitchen MASTECTOMY Bilateral ~1982   bil breast reconstruction with implants  . ORIF TIBIA & FIBULA FRACTURES Left 2007   also had left non displaced malleolar fracture.   Marland Kitchen PERIPHERAL VASCULAR BALLOON ANGIOPLASTY Left 01/19/2018   Procedure: PERIPHERAL VASCULAR BALLOON ANGIOPLASTY;  Surgeon: Serafina Mitchell, MD;  Location: [place] [facility];  Service: Cardiovascular;  Laterality: Left;  fistula  . PERIPHERAL VASCULAR BALLOON ANGIOPLASTY Left 03/19/2018   Procedure: PERIPHERAL VASCULAR BALLOON ANGIOPLASTY;  Surgeon: Angelia Mould, MD;  Location: [place] [facility];  Service: Cardiovascular;  Laterality: Left;  UPPER ARM FISTULA  . PERIPHERAL VASCULAR BALLOON ANGIOPLASTY Right 12/24/2018   Procedure: PERIPHERAL VASCULAR BALLOON ANGIOPLASTY;  Surgeon: Angelia Mould, MD;  Location: [place] [facility];  Service: Cardiovascular;  Laterality: Right;  arm fistula  . PERIPHERAL VASCULAR BALLOON ANGIOPLASTY  03/29/2019   Procedure: PERIPHERAL VASCULAR BALLOON ANGIOPLASTY;  Surgeon: Angelia Mould, MD;  Location: [place] [facility];  Service: Cardiovascular;;  . PERIPHERAL VASCULAR BALLOON  ANGIOPLASTY Right 02/03/2020   Procedure: PERIPHERAL VASCULAR BALLOON ANGIOPLASTY;  Surgeon: Angelia Mould, MD;  Location: [place] [facility];  Service: Cardiovascular;  Laterality: Right;  upper arm fistula  . PERIPHERAL VASCULAR BALLOON ANGIOPLASTY  06/22/2020   Procedure: PERIPHERAL VASCULAR BALLOON ANGIOPLASTY;  Surgeon: Angelia Mould, MD;  Location: [place] [facility];  Service: Cardiovascular;;  Right AVF  . REMOVAL OF GRAFT Left 04/12/2015   Procedure: REMOVAL OF LEFT ARM ARTERIOVENOUS GORE-TEX GRAFT;  Surgeon: Angelia Mould, MD;  Location: [place];  Service: Vascular;  Laterality: Left;  . REVISION OF ARTERIOVENOUS GORETEX GRAFT Right 12/13/2019   Procedure: REVISION OF ARTERIOVENOUS GORETEX GRAFT RIGHT FOREARM;  Surgeon: Angelia Mould, MD;  Location: [place];  Service: Vascular;  Laterality: Right;   Social History:  reports that she has never smoked. She has never used smokeless tobacco. She reports that she does not drink alcohol and does not use drugs.   Allergies  Allergen Reactions  . Erythromycin Swelling  . Codeine Palpitations, Rash and Other (See Comments)    GI Upset  . Erythromycin Base Other (See Comments)    GI Upset  . Penicillins Rash and Other (See Comments)    Has taken Keflex & Rocephin many times without problems Documented on MAR  . Zantac [Ranitidine Hcl] Nausea And Vomiting    Family History  Problem Relation Age of Onset  . Diabetes Mother   . Heart disease Mother   . Non-Hodgkin's lymphoma Mother   . Heart attack Father   . Stroke Father   . Parkinson's disease Brother   . Lung disease Brother   . Heart disease Brother   . Multiple myeloma Sister     Prior to Admission medications   Medication Sig Start Date End Date Taking? Authorizing Provider  acetaminophen (TYLENOL) 325 MG tablet Take 650 mg by mouth every 6 (six) hours as needed for fever (greater than 100 F).    Yes [provider]  allopurinol  (ZYLOPRIM) 100 MG tablet Take 100 mg by mouth daily.    Yes [provider]  amiodarone (PACERONE) 200 MG tablet Take 2 tablets (449m) daily for 7 days. Thereafter take 1 tablet (2045m daily. Patient taking differently: Take 200 mg by mouth daily.  08/10/20  Yes Mikhail, MaVelta AddisonDO  ascorbic acid (HM VITAMIN C) 500 MG tablet Take 500 mg by mouth daily.    Yes [provider]  aspirin 81 MG chewable tablet Chew 81 mg by mouth daily.    Yes [provider]  b complex-vitamin c-folic acid (NEPHRO-VITE) 0.8 MG TABS tablet Take 1 tablet by mouth daily.    Yes [provider]  Brinzolamide-Brimonidine (SIMBRINZA) 1-0.2 % SUSP Place 1 drop into both eyes 2 (two) times daily.    Yes [provider]  calcium carbonate (TUMS - DOSED  IN MG ELEMENTAL CALCIUM) 500 MG chewable tablet Chew 1 tablet by mouth 2 (two) times daily.   Yes [provider]  cetirizine (ZYRTEC) 10 MG tablet Take 10 mg by mouth daily.   Yes [provider]  cholestyramine (QUESTRAN) 4 g packet Take 1 packet (4 g total) by mouth 2 (two) times daily. Take twice daily before breakfast and dinner Patient taking differently: Take 4 g by mouth 2 (two) times daily.  10/13/19  Yes Levin Erp, PA  collagenase (SANTYL) ointment Apply 1 application topically daily. Open ulcer to left posterior flank wound bed   Yes [provider]  diazepam (VALIUM) 5 MG tablet Take 1 tablet (5 mg total) by mouth at bedtime. 07/08/18  Yes Arrien, Jimmy Picket, MD  diclofenac Sodium (VOLTAREN) 1 % GEL Apply 2 g topically See admin instructions. Apply 2 grams to the right knee at bedtime for pain 06/04/20  Yes [provider]  DIMETHICONE, TOPICAL, (SECURA DIMETHICONE PROTECTANT) 5 % CREA Apply 1 application topically See admin instructions. Apply a nickel-sized amount onto the buttocks area 2 times a day for barrier protection   Yes [provider]    diphenoxylate-atropine (LOMOTIL) 2.5-0.025 MG tablet Take 1 tablet by mouth every 6 (six) hours as needed for diarrhea or loose stools. Take 1 tablet by mouth every 6 hours PRN for diarrhea or loose stools   Yes [provider]  fluticasone (FLONASE) 50 MCG/ACT nasal spray Place 2 sprays into both nostrils every 12 (twelve) hours.    Yes [provider]  ipratropium (ATROVENT) 0.03 % nasal spray Place 2 sprays into both nostrils every 12 (twelve) hours. 08/02/20  Yes Hunsucker, Bonna Gains, MD  loperamide (IMODIUM A-D) 2 MG tablet Take 2 tablets (4 mg total) by mouth every 8 (eight) hours as needed for diarrhea or loose stools (for diarrhea). Patient taking differently: Take 4 mg by mouth See admin instructions. Take 2 tablets (4 mg) Daily on MWF; May have 2 tablets (4 mg) prior to Hemodialysis 03/01/19  Yes Pyrtle, Lajuan Lines, MD  Melatonin 5 MG CAPS Take 10 mg by mouth at bedtime.    Yes [provider]  Menthol, Topical Analgesic, (BIOFREEZE) 4 % GEL Apply 1 application topically 3 (three) times daily as needed (bilateral knee pain).   Yes [provider]  midodrine (PROAMATINE) 10 MG tablet Take 10 mg by mouth See admin instructions. Take 1 tablet (10 mg) on MWF for BP Support At Dialysis 09/14/20  Yes [provider]  midodrine (PROAMATINE) 2.5 MG tablet Take 2.5 mg by mouth 3 (three) times daily with meals. Give if BP<120/60   Yes [provider]  promethazine (PHENERGAN) 25 MG tablet Take 25 mg by mouth 2 (two) times daily as needed for nausea.  08/24/20  Yes [provider]  SALINE NASAL MIST NA Place 2 sprays into the nose 3 (three) times daily.   Yes [provider]  sodium phosphate (FLEET) 7-19 GM/118ML ENEM Place 1 enema rectally daily as needed for severe constipation.   Yes [provider]  traMADol (ULTRAM) 50 MG tablet Take 50-100 mg by mouth See admin instructions. Take 1 tablet (50 mg totally) by mouth every 6 hours  as needed for mild - moderate pain; take 2 tablets (164m totally) by mouth as needed for moderate - severe pain 09/26/20  Yes [provider]  traZODone (DESYREL) 50 MG tablet Take 50 mg by mouth at bedtime.    Yes [provider]  OXYGEN Inhale 2 L into the lungs continuous.     [provider]   Physical Exam: Vitals:   10/29/20 1456 10/29/20 1603 10/29/20 1607 10/29/20 1700  BP: 120/63  (!) 146/72 (!) 158/80  Pulse: 78  78 89  Resp: 20 17 (!) 23 18  Temp:      TempSrc:      SpO2: 100%  100% 100%  Weight:      Height:         General exam: Moderately built and nourished patient, lying comfortably supine on the gurney in no obvious distress.  Head, eyes and ENT: Nontraumatic and normocephalic. Pupils equally reacting to light and accommodation. Oral mucosa moist.  Neck: Supple. No JVD, carotid bruit or thyromegaly.  Lymphatics: No lymphadenopathy.  Respiratory system: Clear to auscultation. No increased work of breathing.  Cardiovascular system: S1 and S2 heard, RRR. No JVD, murmurs, gallops, clicks or pedal edema.  Gastrointestinal system: Abdomen is nondistended, soft and nontender. Normal bowel sounds heard. No organomegaly or masses appreciated.  Central nervous system: Alert and oriented. Moves extremities, B/L foot drop   Extremities: Symmetric 5 x 5 power. Peripheral pulses symmetrically felt.   Skin: No rashes or acute findings.  Psychiatry: Pleasant and cooperative.   Labs on Admission:  Basic Metabolic Panel: Recent Labs  Lab 10/29/20 1348  NA 138  K 5.6*  CL 97*  CO2 22  GLUCOSE 155*  BUN 66*  CREATININE 6.28*  CALCIUM 8.3*  MG 2.2   Liver Function Tests: Recent Labs  Lab 10/29/20 1348  AST 21  ALT 19  ALKPHOS 88  BILITOT 1.2  PROT 6.4*  ALBUMIN 2.4*   Recent Labs  Lab 10/29/20 1348  LIPASE 26   No results for input(s): AMMONIA in the last 168 hours. CBC: Recent Labs  Lab 10/29/20 1348  WBC 15.7*    NEUTROABS 14.1*  HGB 12.1  HCT 38.1  MCV 96.5  PLT 246   Cardiac Enzymes: No results for input(s): CKTOTAL, CKMB, CKMBINDEX, TROPONINI in the last 168 hours.  BNP (last 3 results) No results for input(s): PROBNP in the last 8760 hours. CBG: No results for input(s): GLUCAP in the last 168 hours.  Radiological Exams on Admission: CT ABDOMEN PELVIS WO CONTRAST  Result Date: 10/29/2020 CLINICAL DATA:  Generalized abdominal pain EXAM: CT ABDOMEN AND PELVIS WITHOUT CONTRAST TECHNIQUE: Multidetector CT imaging of the abdomen and pelvis was performed following the standard protocol without IV contrast. COMPARISON:  01/22/2016 FINDINGS: Lower chest: There are small chronic bilateral pleural effusions and left lower lobe atelectasis. Extensive calcification of the mitral annulus. Hepatobiliary: No focal liver abnormality is seen. Status post cholecystectomy. No biliary dilatation. Pancreas: Diffuse pancreatic atrophy. Spleen: Normal in size without focal abnormality. Adrenals/Urinary Tract: Severe bilateral renal cortical atrophy. Bilobed complex hypodensity upper pole left kidney measures 2.3 x 1.8 cm and 1.6 x 1.5 cm. Hounsfield attenuation measures between 47 and 63 Hounsfield units, with thin peripheral calcification along the inferior aspect. A simple cyst was seen within this region previous, and this likely reflects a hemorrhagic cyst. There is a nonobstructing 5 mm calculus within the left renal pelvis. No right-sided calculi. No obstructive uropathy. Stable low-attenuation nodule within the right adrenal gland consistent with adenoma. The left adrenal is normal. Stomach/Bowel: There is a large amount of stool within the distal colon and rectum. No bowel obstruction or ileus. No bowel wall thickening or inflammatory change. Vascular/Lymphatic: Aortic atherosclerosis. No enlarged abdominal or pelvic  lymph nodes. Reproductive: Status post hysterectomy. No adnexal masses. Other: Patient is markedly  cachectic. Dependent body wall edema is seen within the lower abdomen and pelvis. No free intraperitoneal fluid or free gas. No abdominal wall hernia. Musculoskeletal: No acute displaced fracture. Bones are diffusely osteopenic. Reconstructed images demonstrate no additional findings. IMPRESSION: 1. Large amount of stool within the distal colon and rectum. 2. Chronic bilateral pleural effusions and left lower lobe atelectasis. 3. Bilobed complex hypodensity upper pole left kidney, likely hemorrhagic cyst. 4. Nonobstructing 5 mm left renal calculus. 5. Aortic Atherosclerosis (ICD10-I70.0). Electronically Signed   By: Randa Ngo M.D.   On: 10/29/2020 15:50     Assessment/Plan Active Problems:   Essential hypertension   DM type 2, uncontrolled, with renal complications (HCC)   Dyslipidemia   Hypotension   ESRD on dialysis (HCC)   Chronic diastolic (congestive) heart failure (HCC)   Paroxysmal atrial fibrillation (HCC)   Chronic respiratory failure with hypoxia (HCC)   Nausea & vomiting   1-Nausea vomiting, abdominal pain likely secondary to constipation: Plan to discontinue Cholestyramine which could cause constipation.  Dulcolax suppository. Occult blood positive, hb stable. Monitor.  Plan to start MiraLAX and Senokot twice daily IV Protonix  2-Paroxysmal A. fib: Continue with amiodarone  3-ESRD on hemodialysis: Hyperkalemia;  Plan to receive a dose of [facility] Nephrology has been consulted   4-Chronic hypotension: Continue with midodrine  5-Chronic respiratory failure on 2 L of oxygen: Continue with nebulizer 6-Leukocytosis: Could be reactive related to constipation and vomiting.  Will check UA. Chest x ray /  Repeat labs in am.   DVT Prophylaxis: Heparin  Code Status: Full code Family Communication: Care discussed with Daughter over phone Disposition Plan: Admit for observation make sure patient has BM, able to eat, and no vomiting.   Time spent: 75 minutes  Elmarie Shiley MD [facility]   10/29/2020, 5:29 PM

## 2020-10-29 NOTE — ED Triage Notes (Signed)
BIB EMS for abdominal pain, vomiting since Saturday night and constipation since Sunday although she reported diarrhea this morning. Pt has hypoactive bowel sounds. Pt reports she passes gas.

## 2020-10-29 NOTE — ED Provider Notes (Signed)
[facility] Provider Note   CSN: [id number] Arrival date & time: 10/29/20  1331     History Chief Complaint  Patient presents with  . Abdominal Pain  . Emesis  . Constipation    [name] James is a 84 y.o. female.  The history is provided by the patient.  Abdominal Pain Pain location:  Generalized Pain radiates to:  Does not radiate Pain severity:  Moderate Duration:  3 days Timing:  Constant Progression:  Waxing and waning Chronicity:  New Relieved by:  Nothing Ineffective treatments:  Bowel activity, flatus and vomiting Associated symptoms: nausea and vomiting   Associated symptoms: no chest pain, no chills, no cough, no diarrhea, no fever, no hematemesis, no shortness of breath and no sore throat        Past Medical History:  Diagnosis Date  . Anemia, chronic disease   . Anxiety   . Arthritis    knees  . Atrial fibrillation ([place])   . Barrett esophagus   . Benign paroxysmal positional vertigo   . Breast cancer ([place]) 1980s   bil mastectomies, no radiation or chemo  . Cataract   . Chest pain, atypical 12/05/2008   R/Lmv- normal perfusion all regions, noe ECG changes   . CHF (congestive heart failure) ([place]) 05/13/2011   echo - WU>98%; stage 1 diastolic dysfunction; elevated LV filling pressure, MAC  . CKD (chronic kidney disease), stage III ([place])   . Claudication ([place]) 10/30/2005   doppler - normal evaluation, no evidence of aneurysm, diameter reduction, dissection, compression or vascular abnormality)  . Complication of anesthesia   . Controlled type 2 diabetes mellitus with diabetic polyneuropathy, with long-term current use of insulin ([place]) 04/17/2016  . Dyslipidemia   . Dysrhythmia    Atrial Fibrillation  . Edema   . ESRD (end stage renal disease) on dialysis ([place])    "[facility]; [address]; MWF" (07/06/2018)  . Family history of adverse reaction to anesthesia    son also has nausea   . Fever blister 12/2016  .  Gastritis and gastroduodenitis   . GERD (gastroesophageal reflux disease)   . Gout   . Hemodialysis-associated hypotension   . HLD (hyperlipidemia)   . Hypertension   . IBS (irritable bowel syndrome)   . Insomnia   . Iron deficiency anemia   . Junctional bradycardia 08/25/2018  . LFT elevation   . Long term current use of amiodarone 10/17/2016  . Morbid (severe) obesity due to excess calories ([place]) 04/29/2017  . Morbid obesity ([place])   . On home oxygen therapy    "2L; 24/7" (07/06/2018)  . PAF (paroxysmal atrial fibrillation) ([place])   . Peripheral neuropathy   . Pneumonia 12/2016  . PONV (postoperative nausea and vomiting)   . Renal cyst   . Renal insufficiency 10/30/2005   doppler - abn resistance consistent w/ parenchymal disease  . Renovascular hypertension   . Type 2 diabetes mellitus with diabetic neuropathy, with long-term current use of insulin ([place])   . Vertigo     Patient Active Problem List   Diagnosis Date Noted  . Atrial fibrillation with RVR ([place]) 08/03/2020  . Nausea and vomiting 03/14/2019  . Chronic diarrhea 03/14/2019  . Dysuria 03/14/2019  . UTI (urinary tract infection) 03/14/2019  . Aortic atherosclerosis ([place]) 12/30/2018  . Hypoalbuminemia due to protein-calorie malnutrition ([place]) 12/30/2018  . Tachycardia-bradycardia syndrome ([place])   . Symptomatic bradycardia 08/24/2018  . Gout 08/23/2018  . Anxiety 08/23/2018  . Pneumothorax, left 10/13/2017  .  Multiple lung nodules on CT 10/06/2017  . Pleural effusion, left 10/06/2017  . Diabetic polyneuropathy associated with type 2 diabetes mellitus ([place]) 07/30/2017  . Onychomycosis of toenail 07/30/2017  . Idiopathic chronic venous hypertension of both lower extremities with inflammation 07/30/2017  . Chronic respiratory failure with hypoxia ([place]) 04/29/2017  . Advance care planning   . Goals of care, counseling/discussion   . Palliative care by specialist   . Pressure ulcer 01/19/2017  . History of pressure  ulcer 10/17/2016  . GAD (generalized anxiety disorder) 10/17/2016  . Osteopenia determined by x-ray 04/17/2016  . Type 2 diabetes mellitus with diabetic neuropathy ([place]) 06/08/2015  . Type 2 diabetes, controlled, with renal manifestation ([place]) 06/08/2015  . PVD (peripheral vascular disease) ([place]) 06/08/2015  . Swelling of limb-Left arm 04/10/2015  . Nausea with vomiting 01/12/2015  . Normocytic anemia 01/12/2015  . Paroxysmal atrial fibrillation ([place]) 12/22/2014  . Melena   . ESRD on dialysis ([place]) 12/19/2014  . Chronic diastolic (congestive) heart failure ([place]) 12/19/2014  . Palpitation 12/19/2014  . Anemia, chronic disease 12/13/2014  . Hypotension 12/13/2014  . Pulmonary edema 11/10/2014  . Physical deconditioning 10/29/2014  . Gastritis and gastroduodenitis 10/24/2014  . Heme positive stool 10/24/2014  . Morbid obesity ([place]) 10/18/2014  . Edema 10/18/2014  . Essential hypertension 10/17/2014  . DM type 2, uncontrolled, with renal complications ([place]) 37/85/8850  . Dyslipidemia 10/17/2014    Past Surgical History:  Procedure Laterality Date  . A/V FISTULAGRAM Left 03/19/2018   Procedure: A/V FISTULAGRAM;  Surgeon: Angelia Mould, MD;  Location: [facility];  Service: Cardiovascular;  Laterality: Left;  . A/V FISTULAGRAM N/A 12/24/2018   Procedure: A/V FISTULAGRAM - Right Arm;  Surgeon: Angelia Mould, MD;  Location: [facility];  Service: Cardiovascular;  Laterality: N/A;  . A/V FISTULAGRAM Right 03/29/2019   Procedure: A/V FISTULAGRAM;  Surgeon: Angelia Mould, MD;  Location: [facility];  Service: Cardiovascular;  Laterality: Right;  . A/V FISTULAGRAM Right 02/03/2020   Procedure: A/V FISTULAGRAM;  Surgeon: Angelia Mould, MD;  Location: [facility];  Service: Cardiovascular;  Laterality: Right;  . A/V SHUNTOGRAM Left 01/19/2018   Procedure: A/V SHUNTOGRAM;  Surgeon: Serafina [facility], MD;  Location: [facility];   Service: Cardiovascular;  Laterality: Left;  . ANGIOPLASTY Left 02/12/2015   Procedure: ANGIOPLASTY;  Surgeon: Angelia Mould, MD;  Location: [facility] C[facility] LAB;  Service: Cardiovascular;  Laterality: Left;  AVF  . AV FISTULA PLACEMENT Left 11/30/2014   Procedure: ARTERIOVENOUS (AV) FISTULA CREATION LEFT ARM;  Surgeon: Angelia Mould, MD;  Location: [facility];  Service: Vascular;  Laterality: Left;  . AV FISTULA PLACEMENT Left 03/20/2015   Procedure:  Inserton of Left Upper Arm Gortex Graft;  Surgeon: Angelia Mould, MD;  Location: [facility];  Service: Vascular;  Laterality: Left;  . AV FISTULA PLACEMENT Left 04/12/2015   Procedure: INSERTION OF LEFT ARM  ARTERIOVENOUS GORE-TEX GRAFT ;  Surgeon: Angelia Mould, MD;  Location: [facility];  Service: Vascular;  Laterality: Left;  . AV FISTULA PLACEMENT Right 06/08/2018   Procedure: INSERTION OF ARTERIOVENOUS (AV) GORE-TEX GRAFT ARM USING 4-7MM X 45CM GORETEX GRAFT;  Surgeon: Angelia Mould, MD;  Location: [facility];  Service: Vascular;  Laterality: Right;  . BACK SURGERY  ~1980  . CATARACT EXTRACTION Right   . CHOLECYSTECTOMY    . ESOPHAGOGASTRODUODENOSCOPY N/A 10/24/2014   Procedure: ESOPHAGOGASTRODUODENOSCOPY (EGD);  Surgeon: Jerene Bears, MD;  Location: [facility];  Service: Endoscopy;  Laterality: N/A;  . EXCHANGE OF A DIALYSIS CATHETER Left 11/30/2014   Procedure: EXCHANGE OF A DIALYSIS CATHETER, LEFT INTERNAL JUGULAR;  Surgeon: Angelia Mould, MD;  Location: [facility];  Service: Vascular;  Laterality: Left;  . EYE SURGERY Left    transplant  . FISTULOGRAM N/A 02/12/2015   Procedure: FISTULOGRAM;  Surgeon: Angelia Mould, MD;  Location: [facility] LAB;  Service: Cardiovascular;  Laterality: N/A;  . HEMORRHOID SURGERY    . IR AV DIALY SHUNT INTRO NEEDLE/INTRACATH INITIAL W/PTA/IMG RIGHT Right 08/25/2018  . IR AV DIALY SHUNT INTRO NEEDLE/INTRACATH INITIAL W/PTA/IMG RIGHT Right 11/17/2018  . IR US GUIDE VASC ACCESS RIGHT   08/25/2018  . LIGATION ARTERIOVENOUS GORTEX GRAFT Left 08/27/2018   Procedure: LIGATION ARTERIOVENOUS GORTEX GRAFT ARM;  Surgeon: Angelia Mould, MD;  Location: [facility];  Service: Vascular;  Laterality: Left;  . LIGATION OF ARTERIOVENOUS  FISTULA Left 03/20/2015   Procedure: LIGATION OF ARTERIOVENOUS  FISTULA;  Surgeon: Angelia Mould, MD;  Location: [facility];  Service: Vascular;  Laterality: Left;  Marland Kitchen MASTECTOMY Bilateral ~1982   bil breast reconstruction with implants  . ORIF TIBIA & FIBULA FRACTURES Left 2007   also had left non displaced malleolar fracture.   Marland Kitchen PERIPHERAL VASCULAR BALLOON ANGIOPLASTY Left 01/19/2018   Procedure: PERIPHERAL VASCULAR BALLOON ANGIOPLASTY;  Surgeon: Seraf[facility], MD;  Location: [facility];  Service: Cardiovascular;  Laterality: Left;  fistula  . PERIPHERAL VASCULAR BALLOON ANGIOPLASTY Left 03/19/2018   Procedure: PERIPHERAL VASCULAR BALLOON ANGIOPLASTY;  Surgeon: Angelia Mould, MD;  Location: [facility];  Service: Cardiovascular;  Laterality: Left;  UPPER ARM FISTULA  . PERIPHERAL VASCULAR BALLOON ANGIOPLASTY Right 12/24/2018   Procedure: PERIPHERAL VASCULAR BALLOON ANGIOPLASTY;  Surgeon: Angelia Mould, MD;  Location: [facility];  Service: Cardiovascular;  Laterality: Right;  arm fistula  . PERIPHERAL VASCULAR BALLOON ANGIOPLASTY  03/29/2019   Procedure: PERIPHERAL VASCULAR BALLOON ANGIOPLASTY;  Surgeon: Angelia Mould, MD;  Location: [facility];  Service: Cardiovascular;;  . PERIPHERAL VASCULAR BALLOON ANGIOPLASTY Right 02/03/2020   Procedure: PERIPHERAL VASCULAR BALLOON ANGIOPLASTY;  Surgeon: Angelia Mould, MD;  Location: [facility];  Service: Cardiovascular;  Laterality: Right;  upper arm fistula  . PERIPHERAL VASCULAR BALLOON ANGIOPLASTY  06/22/2020   Procedure: PERIPHERAL VASCULAR BALLOON ANGIOPLASTY;  Surgeon: Angelia Mould, MD;  Location: [facility];  Service:  Cardiovascular;;  Right AVF  . REMOVAL OF GRAFT Left 04/12/2015   Procedure: REMOVAL OF LEFT ARM ARTERIOVENOUS GORE-TEX GRAFT;  Surgeon: Angelia Mould, MD;  Location: [facility];  Service: Vascular;  Laterality: Left;  . REVISION OF ARTERIOVENOUS GORETEX GRAFT Right 12/13/2019   Procedure: REVISION OF ARTERIOVENOUS GORETEX GRAFT RIGHT FOREARM;  Surgeon: Angelia Mould, MD;  Location: [facility];  Service: Vascular;  Laterality: Right;     OB History   No obstetric history on file.     Family History  Problem Relation Age of Onset  . Diabetes Mother   . Heart disease Mother   . Non-Hodgkin's lymphoma Mother   . Heart attack Father   . Stroke Father   . Parkinson's disease Brother   . Lung disease Brother   . Heart disease Brother   . Multiple myeloma Sister     Social History   Tobacco Use  . Smoking status: Never Smoker  . Smokeless tobacco: Never Used  Vaping Use  . Vaping  Use: Never used  Substance Use Topics  . Alcohol use: No  . Drug use: No    Home Medications Prior to Admission medications   Medication Sig Start Date End Date Taking? Authorizing Provider  acetaminophen (TYLENOL) 325 MG tablet Take 650 mg by mouth every 6 (six) hours as needed for fever (greater than 100 F).     [provider]  allopurinol (ZYLOPRIM) 100 MG tablet Take 100 mg by mouth daily.     [provider]  amiodarone (PACERONE) 200 MG tablet Take 2 tablets (462m) daily for 7 days. Thereafter take 1 tablet (2047m daily. 08/10/20   Mikhail, MaVelta AddisonDO  ascorbic acid (HM VITAMIN C) 500 MG tablet Take 500 mg by mouth daily.    [provider]  aspirin 81 MG chewable tablet Chew 81 mg by mouth daily.     [provider]  B Complex-C-Folic Acid (NEPHRO-VITE PO) Take 1 tablet by mouth daily.    [provider]  Brinzolamide-Brimonidine (SIMBRINZA) 1-0.2 % SUSP Place 1 drop into both eyes 2 (two) times daily. (0900 & 2100)    [provider]  calcium carbonate (TUMS - DOSED IN MG ELEMENTAL CALCIUM) 500 MG chewable tablet Chew 1 tablet by mouth 2 (two) times daily.    [provider]  cetirizine (ZYRTEC) 10 MG tablet Take 10 mg by mouth daily.    [provider]  cholestyramine (QUESTRAN) 4 g packet Take 1 packet (4 g total) by mouth 2 (two) times daily. Take twice daily before breakfast and dinner 10/13/19   LeLevin ErpPA  cyclobenzaprine (FLEXERIL) 10 MG tablet Take 1 tablet (10 mg total) by mouth 2 (two) times daily as needed for muscle spasms. 09/14/20   ScGareth MorganMD  diazepam (VALIUM) 5 MG tablet Take 1 tablet (5 mg total) by mouth at bedtime. 07/08/18   Arrien, MaJimmy PicketMD  diclofenac Sodium (VOLTAREN) 1 % GEL Apply 2 g topically See admin instructions. Apply 2 grams to the right knee at bedtime 06/04/20   [provider]  DIMETHICONE, TOPICAL, (SECURA DIMETHICONE PROTECTANT) 5 % CREA Apply 1 application topically See admin instructions. Apply a nickel-sized amount onto the buttocks area 2 times a day for barrier protection    [provider]  diphenoxylate-atropine (LOMOTIL) 2.5-0.025 MG tablet Take 1 tablet by mouth as directed. Take 1 tablet by mouth every 6 hours PRN for diarrhea or loose stools    [provider]  fluticasone (FLONASE) 50 MCG/ACT nasal spray Place 2 sprays into both nostrils daily.     [provider]  ipratropium (ATROVENT) 0.03 % nasal spray Place 2 sprays into both nostrils every 12 (twelve) hours. 08/02/20   Hunsucker, MaBonna GainsMD  loperamide (IMODIUM A-D) 2 MG tablet Take 2 tablets (4 mg total) by mouth every 8 (eight) hours as needed for diarrhea or loose stools (for diarrhea). Patient taking differently: Take 8 mg by mouth as directed. Take 2 tablets (8 mg) Daily on MWF. She may have 2 tablets (8 mg) prior to Hemodialysis 03/01/19   Pyrtle, JaLajuan LinesMD  Melatonin 5 MG CAPS Take 10 mg by mouth at bedtime.     [provider]  Menthol, Topical Analgesic, (BIOFREEZE) 4 % GEL Apply 1 application topically 3 (three) times daily as needed (bilateral knee pain).    [provider]  midodrine (PROAMATINE) 10 MG tablet Take 10 mg by mouth as directed. Take 1 tablet (10 mg) on  MWF for BP Support At Dialysis 09/14/20   [provider]  midodrine (PROAMATINE) 2.5 MG tablet Take 2.5 mg by mouth as directed. Take 1 tablet TID With Meals for BP. Give if BP<120/60    [provider]  OXYGEN Inhale 2 L into the lungs continuous.     [provider]  promethazine (PHENERGAN) 25 MG tablet Take 25 mg by mouth 2 (two) times daily as needed for nausea or vomiting.  08/24/20   [provider]  SALINE NASAL MIST NA Place 2 sprays into the nose 3 (three) times daily.    [provider]  traZODone (DESYREL) 50 MG tablet Take 50 mg by mouth at bedtime.     [provider]    Allergies    Erythromycin, Codeine, Erythromycin base, Penicillins, and Zantac [ranitidine hcl]  Review of Systems   Review of Systems  Constitutional: Positive for appetite change. Negative for chills and fever.  HENT: Negative for congestion and sore throat.   Respiratory: Negative for cough and shortness of breath.   Cardiovascular: Negative for chest pain and leg swelling.  Gastrointestinal: Positive for abdominal pain, nausea, rectal pain and vomiting. Negative for blood in stool, diarrhea and hematemesis.  Genitourinary:       Makes little urine, on HD  Musculoskeletal: Positive for neck stiffness.  Neurological: Negative for dizziness and headaches.  All other systems reviewed and are negative.   Physical Exam Updated Vital Signs BP (!) 146/72   Pulse 78   Temp 97.8 F (36.6 C) (Oral)   Resp (!) 23   Ht _0  (1.651 m)   Wt 51.7 kg   SpO2 100%   BMI 18.97 kg/m   Physical Exam Vitals reviewed. Exam conducted with a chaperone present.  Constitutional:      Appearance:  She is well-developed.  HENT:     Head: Normocephalic and atraumatic.     Mouth/Throat:     Mouth: Mucous membranes are moist.     Pharynx: Oropharynx is clear.  Cardiovascular:     Rate and Rhythm: Normal rate.  Pulmonary:     Effort: Pulmonary effort is normal. No respiratory distress.     Breath sounds: Normal breath sounds.  Abdominal:     General: Abdomen is flat. There is no distension.     Palpations: Abdomen is soft.     Tenderness: There is no abdominal tenderness.  Genitourinary:    Rectum: No mass or tenderness. Normal anal tone.  Musculoskeletal:     Cervical back: Neck supple.     Right lower leg: No edema.     Left lower leg: No edema.  Neurological:     Mental Status: She is alert.     Cranial Nerves: No dysarthria or facial asymmetry.     Motor: No tremor or abnormal muscle tone.  Psychiatric:        Mood and Affect: Mood normal.        Behavior: Behavior normal.     ED Results / Procedures / Treatments   Labs (all labs ordered are listed, but only abnormal results are displayed) Labs Reviewed  CBC WITH DIFFERENTIAL/PLATELET - Abnormal; Notable for the following components:      Result Value   WBC 15.7 (*)    RDW 18.6 (*)    Neutro Abs 14.1 (*)    Abs Immature Granulocytes 0.10 (*)    All other components within normal limits  COMPREHENSIVE METABOLIC PANEL - Abnormal; Notable for the  following components:   Potassium 5.6 (*)    Chloride 97 (*)    Glucose, Bld 155 (*)    BUN 66 (*)    Creatinine, Ser 6.28 (*)    Calcium 8.3 (*)    Total Protein 6.4 (*)    Albumin 2.4 (*)    GFR, Estimated 6 (*)    Anion gap 19 (*)    All other components within normal limits  POC OCCULT BLOOD, ED - Abnormal; Notable for the following components:   Fecal Occult Bld POSITIVE (*)    All other components within normal limits  RESPIRATORY PANEL BY RT PCR (FLU A&B, COVID)  LIPASE, BLOOD  MAGNESIUM    EKG EKG Interpretation  Date/Time:  Monday October 29 2020  13:31:50 EST Ventricular Rate:  75 PR Interval:    QRS Duration: 114 QT Interval:  400 QTC Calculation: 447 R Axis:   37 Text Interpretation: Sinus rhythm Incomplete left bundle branch block Confirmed by Lajean Saver [phone]) on 10/29/2020 1:47:55 PM   Radiology CT ABDOMEN PELVIS WO CONTRAST  Result Date: 10/29/2020 CLINICAL DATA:  Generalized abdominal pain EXAM: CT ABDOMEN AND PELVIS WITHOUT CONTRAST TECHNIQUE: Multidetector CT imaging of the abdomen and pelvis was performed following the standard protocol without IV contrast. COMPARISON:  01/22/2016 FINDINGS: Lower chest: There are small chronic bilateral pleural effusions and left lower lobe atelectasis. Extensive calcification of the mitral annulus. Hepatobiliary: No focal liver abnormality is seen. Status post cholecystectomy. No biliary dilatation. Pancreas: Diffuse pancreatic atrophy. Spleen: Normal in size without focal abnormality. Adrenals/Urinary Tract: Severe bilateral renal cortical atrophy. Bilobed complex hypodensity upper pole left kidney measures 2.3 x 1.8 cm and 1.6 x 1.5 cm. Hounsfield attenuation measures between 47 and 63 Hounsfield units, with thin peripheral calcification along the inferior aspect. A simple cyst was seen within this region previous, and this likely reflects a hemorrhagic cyst. There is a nonobstructing 5 mm calculus within the left renal pelvis. No right-sided calculi. No obstructive uropathy. Stable low-attenuation nodule within the right adrenal gland consistent with adenoma. The left adrenal is normal. Stomach/Bowel: There is a large amount of stool within the distal colon and rectum. No bowel obstruction or ileus. No bowel wall thickening or inflammatory change. Vascular/Lymphatic: Aortic atherosclerosis. No enlarged abdominal or pelvic lymph nodes. Reproductive: Status post hysterectomy. No adnexal masses. Other: Patient is markedly cachectic. Dependent body wall edema is seen within the lower abdomen and  pelvis. No free intraperitoneal fluid or free gas. No abdominal wall hernia. Musculoskeletal: No acute displaced fracture. Bones are diffusely osteopenic. Reconstructed images demonstrate no additional findings. IMPRESSION: 1. Large amount of stool within the distal colon and rectum. 2. Chronic bilateral pleural effusions and left lower lobe atelectasis. 3. Bilobed complex hypodensity upper pole left kidney, likely hemorrhagic cyst. 4. Nonobstructing 5 mm left renal calculus. 5. Aortic Atherosclerosis (ICD10-I70.0). Electronically Signed   By: Randa Ngo M.D.   On: 10/29/2020 15:50    Procedures Procedures (including critical care time)  Medications Ordered in ED Medications  ondansetron (ZOFRAN) injection 4 mg (4 mg Intravenous Given 10/29/20 1452)    ED Course  I have reviewed the triage vital signs and the nursing notes.  Pertinent labs & imaging results that were available during my care of the patient were reviewed by me and considered in my medical decision making (see chart for details).    MDM Rules/Calculators/A&P  Medical Decision Making: [name] James is a 84 y.o. female who presented to the ED today with abdominal pain, constipation and vomiting. Pt reports abdominal pain and vomiting since Saturday, pt reports >5 non bloody non bilious emesis per day. Pt reports constipation for 1 week, but reports small hard stool this afternoon. Pt reports SNF tried enema without success. Pt reports she is having pain in her rectum and all over her abdomen.   Past medical history significant for hx of paroxysmal atrial fibrillation, hypertension, HLD, GERD, ESRD on HD (MWF, last full session Friday, makes some urine), anemia of chronic disease, chronic diastolic heart failure, DM, and chronic respiratory failure  Pt reports she missed her HD today due to feeling ill, last full session Friday.   Reviewed and confirmed nursing documentation for past medical  history, family history, social history.  On my initial exam, the pt was in NAD, generalized abdominal tenderness, non focal, rectal exam unremarkable, non bloody non melanic stool.   FOB positive.  CT abdomen obtained, large stool burden.  WBC elevated 15.7. Normal Hgb   All radiology and laboratory studies reviewed independently and with my attending physician, agree with reading provided by radiologist unless otherwise noted.   Pt persistently nauseous with constipation for 1 week, will likely need enema.   Unclear etiology of leukocytosis, pleural effusions on CT chronic, no consolidations.   Based on the above findings, I believe patient requires admission.  The above care was discussed with and agreed upon by my attending physician.    Emergency Department Medication Summary:   Medications  ondansetron [facility]) injection 4 mg (4 mg Intravenous Given 10/29/20 1452)      Final Clinical Impression(s) / ED Diagnoses Final diagnoses:  Vomiting, intractability of vomiting not specified, presence of nausea not specified, unspecified vomiting type  Constipation, unspecified constipation type    Rx / DC Orders ED Discharge Orders    None       Roosevelt Locks, MD 10/29/20 1623    Lajean Saver, MD 10/30/20 [phone]

## 2020-10-29 NOTE — ED Notes (Signed)
[phone] [name] James would like an update

## 2020-10-29 NOTE — ED Provider Notes (Signed)
°  Physical Exam  BP (!) 146/72    Pulse 78    Temp 97.8 F (36.6 C) (Oral)    Resp (!) 23    Ht 5\' 5"  (1.651 m)    Wt 51.7 kg    SpO2 100%    BMI 18.97 kg/m   Physical Exam  ED Course/Procedures     Procedures  MDM  Care of patient assumed from Dr. Ottis Stain at 1500.  Patient is an 84 year old lady from a SNF who was ESRD on HD MWF.  She presented for a week of worsening abdominal pain, nausea, vomiting, constipation refractory to medications with poor tolerance to p.o. intake.  Labs remarkable for leukocytosis.  Pending CT scan of her abdomen and pelvis.  CT scan remarkable for significant stool burden but no obvious impaction, and Dr. Ottis Stain had reported that she did not seem impacted on exam.  Due to leukocytosis, patient tolerating p.o. intake poorly, worsening symptoms despite outpatient therapies at her SNF, will admit.  Ordered a milk of molasses enema and provided Tylenol for pain.  Patient accepted to hospitalist service.  Hospitalist requested consult nephrology for potassium 5.6 and missed dialysis today.  Consulted nephrology, who recommended Lokelma 5 g now.  No further acute incidents while under my care.  The care of this patient was overseen by Dr. Laverta Baltimore, ED attending, who agreed with evaluation and management.       Launa Flight, MD 10/29/20 1657    Margette Fast, MD 10/29/20 1747

## 2020-10-30 ENCOUNTER — Encounter (HOSPITAL_COMMUNITY): Payer: Self-pay | Admitting: Internal Medicine

## 2020-10-30 DIAGNOSIS — E1129 Type 2 diabetes mellitus with other diabetic kidney complication: Secondary | ICD-10-CM | POA: Diagnosis not present

## 2020-10-30 DIAGNOSIS — F419 Anxiety disorder, unspecified: Secondary | ICD-10-CM | POA: Diagnosis present

## 2020-10-30 DIAGNOSIS — Z681 Body mass index (BMI) 19 or less, adult: Secondary | ICD-10-CM | POA: Diagnosis not present

## 2020-10-30 DIAGNOSIS — R1110 Vomiting, unspecified: Secondary | ICD-10-CM | POA: Diagnosis not present

## 2020-10-30 DIAGNOSIS — Z7401 Bed confinement status: Secondary | ICD-10-CM | POA: Diagnosis not present

## 2020-10-30 DIAGNOSIS — J9811 Atelectasis: Secondary | ICD-10-CM | POA: Diagnosis present

## 2020-10-30 DIAGNOSIS — D72829 Elevated white blood cell count, unspecified: Secondary | ICD-10-CM | POA: Diagnosis present

## 2020-10-30 DIAGNOSIS — E1122 Type 2 diabetes mellitus with diabetic chronic kidney disease: Secondary | ICD-10-CM | POA: Diagnosis present

## 2020-10-30 DIAGNOSIS — J9611 Chronic respiratory failure with hypoxia: Secondary | ICD-10-CM | POA: Diagnosis present

## 2020-10-30 DIAGNOSIS — E1142 Type 2 diabetes mellitus with diabetic polyneuropathy: Secondary | ICD-10-CM | POA: Diagnosis not present

## 2020-10-30 DIAGNOSIS — I480 Paroxysmal atrial fibrillation: Secondary | ICD-10-CM | POA: Diagnosis present

## 2020-10-30 DIAGNOSIS — R112 Nausea with vomiting, unspecified: Secondary | ICD-10-CM | POA: Diagnosis present

## 2020-10-30 DIAGNOSIS — E785 Hyperlipidemia, unspecified: Secondary | ICD-10-CM | POA: Diagnosis present

## 2020-10-30 DIAGNOSIS — Z20822 Contact with and (suspected) exposure to covid-19: Secondary | ICD-10-CM | POA: Diagnosis present

## 2020-10-30 DIAGNOSIS — N200 Calculus of kidney: Secondary | ICD-10-CM | POA: Diagnosis present

## 2020-10-30 DIAGNOSIS — K2270 Barrett's esophagus without dysplasia: Secondary | ICD-10-CM | POA: Diagnosis present

## 2020-10-30 DIAGNOSIS — K5900 Constipation, unspecified: Principal | ICD-10-CM

## 2020-10-30 DIAGNOSIS — E875 Hyperkalemia: Secondary | ICD-10-CM | POA: Diagnosis present

## 2020-10-30 DIAGNOSIS — N2581 Secondary hyperparathyroidism of renal origin: Secondary | ICD-10-CM | POA: Diagnosis present

## 2020-10-30 DIAGNOSIS — N186 End stage renal disease: Secondary | ICD-10-CM | POA: Diagnosis present

## 2020-10-30 DIAGNOSIS — I700 Atherosclerosis of aorta: Secondary | ICD-10-CM | POA: Diagnosis present

## 2020-10-30 DIAGNOSIS — K219 Gastro-esophageal reflux disease without esophagitis: Secondary | ICD-10-CM | POA: Diagnosis present

## 2020-10-30 DIAGNOSIS — Z992 Dependence on renal dialysis: Secondary | ICD-10-CM | POA: Diagnosis not present

## 2020-10-30 DIAGNOSIS — E1151 Type 2 diabetes mellitus with diabetic peripheral angiopathy without gangrene: Secondary | ICD-10-CM | POA: Diagnosis present

## 2020-10-30 DIAGNOSIS — E1165 Type 2 diabetes mellitus with hyperglycemia: Secondary | ICD-10-CM | POA: Diagnosis present

## 2020-10-30 DIAGNOSIS — R1084 Generalized abdominal pain: Secondary | ICD-10-CM | POA: Diagnosis not present

## 2020-10-30 DIAGNOSIS — I5032 Chronic diastolic (congestive) heart failure: Secondary | ICD-10-CM | POA: Diagnosis present

## 2020-10-30 DIAGNOSIS — I9589 Other hypotension: Secondary | ICD-10-CM | POA: Diagnosis present

## 2020-10-30 DIAGNOSIS — Z853 Personal history of malignant neoplasm of breast: Secondary | ICD-10-CM | POA: Diagnosis not present

## 2020-10-30 LAB — URINALYSIS, ROUTINE W REFLEX MICROSCOPIC
Bilirubin Urine: NEGATIVE
Glucose, UA: NEGATIVE mg/dL
Ketones, ur: NEGATIVE mg/dL
Nitrite: NEGATIVE
Protein, ur: 100 mg/dL — AB
RBC / HPF: >50 RBC/hpf — ABNORMAL HIGH (ref 0–5)
Specific Gravity, Urine: 1.01 (ref 1.005–1.030)
Squamous Epithelial / HPF: >50 — ABNORMAL HIGH (ref 0–5)
WBC, UA: >50 WBC/hpf — ABNORMAL HIGH (ref 0–5)
pH: 8 (ref 5.0–8.0)

## 2020-10-30 LAB — CBC
HCT: 39.6 % (ref 36.0–46.0)
Hemoglobin: 12.6 g/dL (ref 12.0–15.0)
MCH: 30.7 pg (ref 26.0–34.0)
MCHC: 31.8 g/dL (ref 30.0–36.0)
MCV: 96.6 fL (ref 80.0–100.0)
Platelets: 296 10*3/uL (ref 150–400)
RBC: 4.1 MIL/uL (ref 3.87–5.11)
RDW: 18.8 % — ABNORMAL HIGH (ref 11.5–15.5)
WBC: 20.3 10*3/uL — ABNORMAL HIGH (ref 4.0–10.5)
nRBC: 0 % (ref 0.0–0.2)

## 2020-10-30 LAB — BASIC METABOLIC PANEL
Anion gap: 21 — ABNORMAL HIGH (ref 5–15)
BUN: 71 mg/dL — ABNORMAL HIGH (ref 8–23)
CO2: 23 mmol/L (ref 22–32)
Calcium: 8.6 mg/dL — ABNORMAL LOW (ref 8.9–10.3)
Chloride: 97 mmol/L — ABNORMAL LOW (ref 98–111)
Creatinine, Ser: 6.33 mg/dL — ABNORMAL HIGH (ref 0.44–1.00)
GFR, Estimated: 6 mL/min — ABNORMAL LOW (ref >60)
Glucose, Bld: 131 mg/dL — ABNORMAL HIGH (ref 70–99)
Potassium: 5.7 mmol/L — ABNORMAL HIGH (ref 3.5–5.1)
Sodium: 141 mmol/L (ref 135–145)

## 2020-10-30 LAB — HEPATITIS B SURFACE ANTIGEN: Hepatitis B Surface Ag: NONREACTIVE

## 2020-10-30 MED ORDER — MIDODRINE HCL 5 MG PO TABS
10.0000 mg | ORAL_TABLET | ORAL | Status: DC
  Administered 2020-11-01: 10 mg via ORAL

## 2020-10-30 MED ORDER — SODIUM CHLORIDE 0.9 % IV SOLN
2.0000 g | INTRAVENOUS | Status: DC
  Administered 2020-10-30 – 2020-10-31 (×2): 2 g via INTRAVENOUS
  Filled 2020-10-30 (×2): qty 20

## 2020-10-30 MED ORDER — DOXERCALCIFEROL 4 MCG/2ML IV SOLN
INTRAVENOUS | Status: AC
  Filled 2020-10-30: qty 2

## 2020-10-30 MED ORDER — TRAMADOL HCL 50 MG PO TABS
50.0000 mg | ORAL_TABLET | Freq: Four times a day (QID) | ORAL | Status: DC | PRN
  Administered 2020-10-31 (×2): 50 mg via ORAL
  Administered 2020-11-02: 100 mg via ORAL
  Filled 2020-10-30: qty 1
  Filled 2020-10-30: qty 2
  Filled 2020-10-30: qty 1

## 2020-10-30 MED ORDER — DOXERCALCIFEROL 4 MCG/2ML IV SOLN
1.0000 ug | INTRAVENOUS | Status: DC
  Administered 2020-10-30: 1 ug via INTRAVENOUS
  Filled 2020-10-30 (×2): qty 2

## 2020-10-30 MED ORDER — METRONIDAZOLE IN NACL 5-0.79 MG/ML-% IV SOLN
500.0000 mg | Freq: Three times a day (TID) | INTRAVENOUS | Status: DC
  Administered 2020-10-30 – 2020-10-31 (×4): 500 mg via INTRAVENOUS
  Filled 2020-10-30 (×4): qty 100

## 2020-10-30 MED ORDER — SORBITOL 70 % SOLN
30.0000 mL | Freq: Once | Status: AC
  Administered 2020-10-30: 30 mL via ORAL
  Filled 2020-10-30: qty 30

## 2020-10-30 NOTE — ED Notes (Signed)
Patient had a large bowel movement that was diarrhea. No blood assessed in the stool. Fresh linens applied. Pt then transported to 5MW

## 2020-10-30 NOTE — ED Notes (Signed)
Patient arrive to room 39

## 2020-10-30 NOTE — Plan of Care (Signed)
  Problem: Skin Integrity: Goal: Risk for impaired skin integrity will decrease Outcome: Progressing   

## 2020-10-30 NOTE — ED Notes (Signed)
Lunch Tray Ordered @ 1033. 

## 2020-10-30 NOTE — ED Notes (Signed)
Assuming care of patient at this time. Pt is resting with eyes closed and in NAD. VSS. Monitoring equipment in place. Call bell within reach. Bed In low position.

## 2020-10-30 NOTE — ED Notes (Signed)
Assisted patient back in bed. Pt in NAD. Pt able to stand and walk with no assistance.

## 2020-10-30 NOTE — ED Notes (Signed)
Help get patient clean up and a new brief on call bell in reach

## 2020-10-30 NOTE — ED Notes (Signed)
MD notified myself and lab cannot find any veins to poke to obtain blood cultures.

## 2020-10-30 NOTE — Consult Note (Signed)
[facility]  INPATIENT CONSULTATION  Reason for Consultation: comanagement of ESRD and assoc conditions Requesting Provider: Dr. Tyrell Antonio  HPI: [name] James is an 84 y.o. female with ESRD on HD, A fib, CHF, DM, HTN, barrett esophagus seen for eval and management of ESRD and assoc conditions during an admission for N/V/constipation.   Presented to ED yesterday with ^ vomiting recently.  CT showed large amount of stool in colon and rectum.  She is being treated with bowel regimen.  K was noted to be 5.6 and lokelma was given.  This AM K 5.7.  Noted to  Have WBC 16k yesterday, 20k today;  She's afebrile.  Being given empiric CTX and flagyl while culture pending.   She had some BM overnight and feels a bit better but still quite uncomfortable.  Son is bedside.    At baseline she is nonambulatory, resides in SNF.  She takes midodrine for hypotension.   PMH: Past Medical History:  Diagnosis Date   Anemia, chronic disease    Anxiety    Arthritis    knees   Atrial fibrillation (HCC)    Barrett esophagus    Benign paroxysmal positional vertigo    Breast cancer ([place]) 1980s   bil mastectomies, no radiation or chemo   Cataract    Chest pain, atypical 12/05/2008   R/Lmv- normal perfusion all regions, noe ECG changes    CHF (congestive heart failure) ([place]) 05/13/2011   echo - OV>78%; stage 1 diastolic dysfunction; elevated LV filling pressure, MAC   CKD (chronic kidney disease), stage III (HCC)    Claudication ([place]) 10/30/2005   doppler - normal evaluation, no evidence of aneurysm, diameter reduction, dissection, compression or vascular abnormality)   Complication of anesthesia    Controlled type 2 diabetes mellitus with diabetic polyneuropathy, with long-term current use of insulin ([place]) 04/17/2016   Dyslipidemia    Dysrhythmia    Atrial Fibrillation   Edema    ESRD (end stage renal disease) on dialysis ([place])    "[facility]; [address]; MWF" (07/06/2018)   Family history of  adverse reaction to anesthesia    son also has nausea    Fever blister 12/2016   Gastritis and gastroduodenitis    GERD (gastroesophageal reflux disease)    Gout    Hemodialysis-associated hypotension    HLD (hyperlipidemia)    Hypertension    IBS (irritable bowel syndrome)    Insomnia    Iron deficiency anemia    Junctional bradycardia 08/25/2018   LFT elevation    Long term current use of amiodarone 10/17/2016   Morbid (severe) obesity due to excess calories ([place]) 04/29/2017   Morbid obesity ([place])    On home oxygen therapy    "2L; 24/7" (07/06/2018)   PAF (paroxysmal atrial fibrillation) (HCC)    Peripheral neuropathy    Pneumonia 12/2016   PONV (postoperative nausea and vomiting)    Renal cyst    Renal insufficiency 10/30/2005   doppler - abn resistance consistent w/ parenchymal disease   Renovascular hypertension    Type 2 diabetes mellitus with diabetic neuropathy, with long-term current use of insulin (HCC)    Vertigo    PSH: Past Surgical History:  Procedure Laterality Date   A/V FISTULAGRAM Left 03/19/2018   Procedure: A/V FISTULAGRAM;  Surgeon: Angelia Mould, MD;  Location: [place] [facility];  Service: Cardiovascular;  Laterality: Left;   A/V FISTULAGRAM N/A 12/24/2018   Procedure: A/V FISTULAGRAM - Right Arm;  Surgeon: Angelia Mould,  MD;  Location: [place] [facility];  Service: Cardiovascular;  Laterality: N/A;   A/V FISTULAGRAM Right 03/29/2019   Procedure: A/V FISTULAGRAM;  Surgeon: Angelia Mould, MD;  Location: [place] [facility];  Service: Cardiovascular;  Laterality: Right;   A/V FISTULAGRAM Right 02/03/2020   Procedure: A/V FISTULAGRAM;  Surgeon: Angelia Mould, MD;  Location: [place] [facility];  Service: Cardiovascular;  Laterality: Right;   A/V SHUNTOGRAM Left 01/19/2018   Procedure: A/V SHUNTOGRAM;  Surgeon: Serafina Mitchell, MD;  Location: [place] [facility];  Service: Cardiovascular;  Laterality: Left;   ANGIOPLASTY Left  02/12/2015   Procedure: ANGIOPLASTY;  Surgeon: Angelia Mould, MD;  Location: [facility] CATH LAB;  Service: Cardiovascular;  Laterality: Left;  AVF   AV FISTULA PLACEMENT Left 11/30/2014   Procedure: ARTERIOVENOUS (AV) FISTULA CREATION LEFT ARM;  Surgeon: Angelia Mould, MD;  Location: [place];  Service: Vascular;  Laterality: Left;   AV FISTULA PLACEMENT Left 03/20/2015   Procedure:  Inserton of Left Upper Arm Gortex Graft;  Surgeon: Angelia Mould, MD;  Location: [place];  Service: Vascular;  Laterality: Left;   AV FISTULA PLACEMENT Left 04/12/2015   Procedure: INSERTION OF LEFT ARM  ARTERIOVENOUS GORE-TEX GRAFT ;  Surgeon: Angelia Mould, MD;  Location: [place];  Service: Vascular;  Laterality: Left;   AV FISTULA PLACEMENT Right 06/08/2018   Procedure: INSERTION OF ARTERIOVENOUS (AV) GORE-TEX GRAFT ARM USING 4-7MM X 45CM GORETEX GRAFT;  Surgeon: Angelia Mould, MD;  Location: [place];  Service: Vascular;  Laterality: Right;   BACK SURGERY  ~1980   CATARACT EXTRACTION Right    CHOLECYSTECTOMY     ESOPHAGOGASTRODUODENOSCOPY N/A 10/24/2014   Procedure: ESOPHAGOGASTRODUODENOSCOPY (EGD);  Surgeon: Jerene Bears, MD;  Location: [facility] ENDOSCOPY;  Service: Endoscopy;  Laterality: N/A;   EXCHANGE OF A DIALYSIS CATHETER Left 11/30/2014   Procedure: EXCHANGE OF A DIALYSIS CATHETER, LEFT INTERNAL JUGULAR;  Surgeon: Angelia Mould, MD;  Location: [place];  Service: Vascular;  Laterality: Left;   EYE SURGERY Left    transplant   FISTULOGRAM N/A 02/12/2015   Procedure: FISTULOGRAM;  Surgeon: Angelia Mould, MD;  Location: [facility] CATH LAB;  Service: Cardiovascular;  Laterality: N/A;   HEMORRHOID SURGERY     IR AV DIALY SHUNT INTRO NEEDLE/INTRACATH INITIAL W/PTA/IMG RIGHT Right 08/25/2018   IR AV DIALY SHUNT INTRO NEEDLE/INTRACATH INITIAL W/PTA/IMG RIGHT Right 11/17/2018   IR US GUIDE VASC ACCESS RIGHT  08/25/2018   LIGATION ARTERIOVENOUS GORTEX GRAFT Left 08/27/2018   Procedure: LIGATION  ARTERIOVENOUS GORTEX GRAFT ARM;  Surgeon: Angelia Mould, MD;  Location: [place];  Service: Vascular;  Laterality: Left;   LIGATION OF ARTERIOVENOUS  FISTULA Left 03/20/2015   Procedure: LIGATION OF ARTERIOVENOUS  FISTULA;  Surgeon: Angelia Mould, MD;  Location: [place];  Service: Vascular;  Laterality: Left;   MASTECTOMY Bilateral ~1982   bil breast reconstruction with implants   ORIF TIBIA & FIBULA FRACTURES Left 2007   also had left non displaced malleolar fracture.    PERIPHERAL VASCULAR BALLOON ANGIOPLASTY Left 01/19/2018   Procedure: PERIPHERAL VASCULAR BALLOON ANGIOPLASTY;  Surgeon: Serafina Mitchell, MD;  Location: [place] [facility];  Service: Cardiovascular;  Laterality: Left;  fistula   PERIPHERAL VASCULAR BALLOON ANGIOPLASTY Left 03/19/2018   Procedure: PERIPHERAL VASCULAR BALLOON ANGIOPLASTY;  Surgeon: Angelia Mould, MD;  Location: [place] [facility];  Service: Cardiovascular;  Laterality: Left;  UPPER ARM FISTULA   PERIPHERAL VASCULAR BALLOON ANGIOPLASTY  Right 12/24/2018   Procedure: PERIPHERAL VASCULAR BALLOON ANGIOPLASTY;  Surgeon: Angelia Mould, MD;  Location: [place] [facility];  Service: Cardiovascular;  Laterality: Right;  arm fistula   PERIPHERAL VASCULAR BALLOON ANGIOPLASTY  03/29/2019   Procedure: PERIPHERAL VASCULAR BALLOON ANGIOPLASTY;  Surgeon: Angelia Mould, MD;  Location: [place] [facility];  Service: Cardiovascular;;   PERIPHERAL VASCULAR BALLOON ANGIOPLASTY Right 02/03/2020   Procedure: PERIPHERAL VASCULAR BALLOON ANGIOPLASTY;  Surgeon: Angelia Mould, MD;  Location: [place] [facility];  Service: Cardiovascular;  Laterality: Right;  upper arm fistula   PERIPHERAL VASCULAR BALLOON ANGIOPLASTY  06/22/2020   Procedure: PERIPHERAL VASCULAR BALLOON ANGIOPLASTY;  Surgeon: Angelia Mould, MD;  Location: [place] [facility];  Service: Cardiovascular;;  Right AVF   REMOVAL OF GRAFT Left 04/12/2015   Procedure: REMOVAL OF LEFT ARM  ARTERIOVENOUS GORE-TEX GRAFT;  Surgeon: Angelia Mould, MD;  Location: [place];  Service: Vascular;  Laterality: Left;   REVISION OF ARTERIOVENOUS GORETEX GRAFT Right 12/13/2019   Procedure: REVISION OF ARTERIOVENOUS GORETEX GRAFT RIGHT FOREARM;  Surgeon: Angelia Mould, MD;  Location: [place];  Service: Vascular;  Laterality: Right;    Past Medical History:  Diagnosis Date   Anemia, chronic disease    Anxiety    Arthritis    knees   Atrial fibrillation ([place])    Barrett esophagus    Benign paroxysmal positional vertigo    Breast cancer ([place]) 1980s   bil mastectomies, no radiation or chemo   Cataract    Chest pain, atypical 12/05/2008   R/Lmv- normal perfusion all regions, noe ECG changes    CHF (congestive heart failure) ([place]) 05/13/2011   echo - TF>57%; stage 1 diastolic dysfunction; elevated LV filling pressure, MAC   CKD (chronic kidney disease), stage III ([place])    Claudication ([place]) 10/30/2005   doppler - normal evaluation, no evidence of aneurysm, diameter reduction, dissection, compression or vascular abnormality)   Complication of anesthesia    Controlled type 2 diabetes mellitus with diabetic polyneuropathy, with long-term current use of insulin ([place]) 04/17/2016   Dyslipidemia    Dysrhythmia    Atrial Fibrillation   Edema    ESRD (end stage renal disease) on dialysis ([place])    "[facility]; [address]; MWF" (07/06/2018)   Family history of adverse reaction to anesthesia    son also has nausea    Fever blister 12/2016   Gastritis and gastroduodenitis    GERD (gastroesophageal reflux disease)    Gout    Hemodialysis-associated hypotension    HLD (hyperlipidemia)    Hypertension    IBS (irritable bowel syndrome)    Insomnia    Iron deficiency anemia    Junctional bradycardia 08/25/2018   LFT elevation    Long term current use of amiodarone 10/17/2016   Morbid (severe) obesity due to excess calories ([place]) 04/29/2017   Morbid obesity ([place])    On home oxygen therapy     "2L; 24/7" (07/06/2018)   PAF (paroxysmal atrial fibrillation) (HCC)    Peripheral neuropathy    Pneumonia 12/2016   PONV (postoperative nausea and vomiting)    Renal cyst    Renal insufficiency 10/30/2005   doppler - abn resistance consistent w/ parenchymal disease   Renovascular hypertension    Type 2 diabetes mellitus with diabetic neuropathy, with long-term current use of insulin (HCC)    Vertigo     Medications:  I have reviewed the patient's current medications.  (Not in a hospital admission)  ALLERGIES:   Allergies  Allergen Reactions   Erythromycin Swelling   Codeine Palpitations, Rash and Other (See Comments)    GI Upset   Erythromycin Base Other (See Comments)    GI Upset   Penicillins Rash and Other (See Comments)    Has taken Keflex & Rocephin many times without problems Documented on MAR   Zantac [Ranitidine Hcl] Nausea And Vomiting    FAM HX: Family History  Problem Relation Age of Onset   Diabetes Mother    Heart disease Mother    Non-Hodgkin's lymphoma Mother    Heart attack Father    Stroke Father    Parkinson's disease Brother    Lung disease Brother    Heart disease Brother    Multiple myeloma Sister     Social History:   reports that she has never smoked. She has never used smokeless tobacco. She reports that she does not drink alcohol and does not use drugs.  ROS: 12 system ROS per HPI above  Blood pressure (!) 116/53, pulse 62, temperature 98.4 F (36.9 C), temperature source Oral, resp. rate 16, height _0  (1.651 m), weight 51.7 kg, SpO2 100 %. PHYSICAL EXAM: Gen: frail, elderly, weak appearing  Eyes: anicteric ENT:MMM Neck: supple, no JVD CV:  irreg irreg Abd:  soft, mildly distended and mildly TTP Lungs: clear ant Extr:  no edema, R FA loop AVG +t/b fairly weak Neuro: weak but nonfocal Skin: scattered ecchymoses, no rashes   Results for orders placed or performed during the hospital encounter of 10/29/20 (from the past 48  hour(s))  CBC with Differential     Status: Abnormal   Collection Time: 10/29/20  1:48 PM  Result Value Ref Range   WBC 15.7 (H) 4.0 - 10.5 K/uL   RBC 3.95 3.87 - 5.11 MIL/uL   Hemoglobin 12.1 12.0 - 15.0 g/dL   HCT 38.1 36 - 46 %   MCV 96.5 80.0 - 100.0 fL   MCH 30.6 26.0 - 34.0 pg   MCHC 31.8 30.0 - 36.0 g/dL   RDW 18.6 (H) 11.5 - 15.5 %   Platelets 246 150 - 400 K/uL   nRBC 0.0 0.0 - 0.2 %   Neutrophils Relative % 90 %   Neutro Abs 14.1 (H) 1.7 - 7.7 K/uL   Lymphocytes Relative 6 %   Lymphs Abs 0.9 0.7 - 4.0 K/uL   Monocytes Relative 3 %   Monocytes Absolute 0.5 0.1 - 1.0 K/uL   Eosinophils Relative 0 %   Eosinophils Absolute 0.0 0.0 - 0.5 K/uL   Basophils Relative 0 %   Basophils Absolute 0.0 0.0 - 0.1 K/uL   Immature Granulocytes 1 %   Abs Immature Granulocytes 0.10 (H) 0.00 - 0.07 K/uL    Comment: Performed at [facility], [address], [place], [place] [address]  Comprehensive metabolic panel     Status: Abnormal   Collection Time: 10/29/20  1:48 PM  Result Value Ref Range   Sodium 138 135 - 145 mmol/L   Potassium 5.6 (H) 3.5 - 5.1 mmol/L   Chloride 97 (L) 98 - 111 mmol/L   CO2 22 22 - 32 mmol/L   Glucose, Bld 155 (H) 70 - 99 mg/dL    Comment: Glucose reference range applies only to samples taken after fasting for at least 8 hours.   BUN 66 (H) 8 - 23 mg/dL   Creatinine, Ser 6.28 (H) 0.44 - 1.00 mg/dL   Calcium 8.3 (L) 8.9 -  10.3 mg/dL   Total Protein 6.4 (L) 6.5 - 8.1 g/dL   Albumin 2.4 (L) 3.5 - 5.0 g/dL   AST 21 15 - 41 U/L   ALT 19 0 - 44 U/L   Alkaline Phosphatase 88 38 - 126 U/L   Total Bilirubin 1.2 0.3 - 1.2 mg/dL   GFR, Estimated 6 (L) >60 mL/min    Comment: (NOTE) Calculated using the CKD-EPI Creatinine Equation (2021)    Anion gap 19 (H) 5 - 15    Comment: Performed at [facility] [address], [place], [place] [address]  Lipase, blood     Status: None   Collection Time: 10/29/20  1:48 PM  Result Value Ref Range   Lipase 26 11  - 51 U/L    Comment: Performed at [facility] [address], [place], [place] [address]  Magnesium     Status: None   Collection Time: 10/29/20  1:48 PM  Result Value Ref Range   Magnesium 2.2 1.7 - 2.4 mg/dL    Comment: Performed at [facility] [address], [place], [place] [address]  POC occult blood, ED     Status: Abnormal   Collection Time: 10/29/20  2:13 PM  Result Value Ref Range   Fecal Occult Bld POSITIVE (A) NEGATIVE  Respiratory Panel by RT PCR (Flu A&B, Covid) - Nasopharyngeal Swab     Status: None   Collection Time: 10/29/20  4:02 PM   Specimen: Nasopharyngeal Swab  Result Value Ref Range   SARS Coronavirus 2 by RT PCR NEGATIVE NEGATIVE    Comment: (NOTE) SARS-CoV-2 target nucleic acids are NOT DETECTED.  The SARS-CoV-2 RNA is generally detectable in upper respiratoy specimens during the acute phase of infection. The lowest concentration of SARS-CoV-2 viral copies this assay can detect is 131 copies/mL. A negative result does not preclude SARS-Cov-2 infection and should not be used as the sole basis for treatment or other patient management decisions. A negative result may occur with  improper specimen collection/handling, submission of specimen other than nasopharyngeal swab, presence of viral mutation(s) within the areas targeted by this assay, and inadequate number of viral copies (<131 copies/mL). A negative result must be combined with clinical observations, patient history, and epidemiological information. The expected result is Negative.  Fact Sheet for Patients:  PinkCheek.be  Fact Sheet for Healthcare Providers:  GravelBags.it  This test is no t yet approved or cleared by the [place] FDA and  has been authorized for detection and/or diagnosis of SARS-CoV-2 by FDA under an Emergency Use Authorization (EUA). This EUA will remain  in effect (meaning this test can be used) for  the duration of the COVID-19 declaration under Section 564(b)(1) of the Act, 21 U.S.C. section 360bbb-3(b)(1), unless the authorization is terminated or revoked sooner.     Influenza A by PCR NEGATIVE NEGATIVE   Influenza B by PCR NEGATIVE NEGATIVE    Comment: (NOTE) The Xpert Xpress SARS-CoV-2/FLU/RSV assay is intended as an aid in  the diagnosis of influenza from Nasopharyngeal swab specimens and  should not be used as a sole basis for treatment. Nasal washings and  aspirates are unacceptable for Xpert Xpress SARS-CoV-2/FLU/RSV  testing.  Fact Sheet for Patients: PinkCheek.be  Fact Sheet for Healthcare Providers: GravelBags.it  This test is not yet approved or cleared by the [place] FDA and  has been authorized for detection and/or diagnosis of SARS-CoV-2 by  FDA under an Emergency Use Authorization (EUA). This  EUA will remain  in effect (meaning this test can be used) for the duration of the  Covid-19 declaration under Section 564(b)(1) of the Act, 21  U.S.C. section 360bbb-3(b)(1), unless the authorization is  terminated or revoked. Performed at [facility], [place] [address], [place], [place] [address]   Creatinine, serum     Status: Abnormal   Collection Time: 10/29/20  7:37 PM  Result Value Ref Range   Creatinine, Ser 6.21 (H) 0.44 - 1.00 mg/dL   GFR, Estimated 6 (L) >60 mL/min    Comment: (NOTE) Calculated using the CKD-EPI Creatinine Equation (2021) Performed at [facility] [address] [place], [place] [address]   Basic metabolic panel     Status: Abnormal   Collection Time: 10/30/20  2:44 AM  Result Value Ref Range   Sodium 141 135 - 145 mmol/L   Potassium 5.7 (H) 3.5 - 5.1 mmol/L   Chloride 97 (L) 98 - 111 mmol/L   CO2 23 22 - 32 mmol/L   Glucose, Bld 131 (H) 70 - 99 mg/dL    Comment: Glucose reference range applies only to samples taken after fasting for at least 8 hours.   BUN  71 (H) 8 - 23 mg/dL   Creatinine, Ser 6.33 (H) 0.44 - 1.00 mg/dL   Calcium 8.6 (L) 8.9 - 10.3 mg/dL   GFR, Estimated 6 (L) >60 mL/min    Comment: (NOTE) Calculated using the CKD-EPI Creatinine Equation (2021)    Anion gap 21 (H) 5 - 15    Comment: Performed at [facility] [address] [place], [place] [address]  CBC     Status: Abnormal   Collection Time: 10/30/20  2:44 AM  Result Value Ref Range   WBC 20.3 (H) 4.0 - 10.5 K/uL   RBC 4.10 3.87 - 5.11 MIL/uL   Hemoglobin 12.6 12.0 - 15.0 g/dL   HCT 39.6 36 - 46 %   MCV 96.6 80.0 - 100.0 fL   MCH 30.7 26.0 - 34.0 pg   MCHC 31.8 30.0 - 36.0 g/dL   RDW 18.8 (H) 11.5 - 15.5 %   Platelets 296 150 - 400 K/uL   nRBC 0.0 0.0 - 0.2 %    Comment: Performed at [facility], [place] [address] [place], [place] [address]  Urinalysis, Routine w reflex microscopic Urine, Catheterized     Status: Abnormal   Collection Time: 10/30/20  8:30 AM  Result Value Ref Range   Color, Urine AMBER (A) YELLOW    Comment: BIOCHEMICALS MAY BE AFFECTED BY COLOR   APPearance TURBID (A) CLEAR   Specific Gravity, Urine 1.010 1.005 - 1.030   pH 8.0 5.0 - 8.0   Glucose, UA NEGATIVE NEGATIVE mg/dL   Hgb urine dipstick SMALL (A) NEGATIVE   Bilirubin Urine NEGATIVE NEGATIVE   Ketones, ur NEGATIVE NEGATIVE mg/dL   Protein, ur 100 (A) NEGATIVE mg/dL   Nitrite NEGATIVE NEGATIVE   Leukocytes,Ua MODERATE (A) NEGATIVE   RBC / HPF >50 (H) 0 - 5 RBC/hpf   WBC, UA >50 (H) 0 - 5 WBC/hpf   Bacteria, UA MANY (A) NONE SEEN   Squamous Epithelial / LPF >50 (H) 0 - 5   WBC Clumps PRESENT    Non Squamous Epithelial 21-50 (A) NONE SEEN    Comment: Performed at [facility], [place] [address], [place], [place] [address]    CT ABDOMEN PELVIS WO CONTRAST  Result Date: 10/29/2020 CLINICAL DATA:  Generalized abdominal pain EXAM: CT ABDOMEN AND PELVIS WITHOUT CONTRAST TECHNIQUE: Multidetector CT imaging of the abdomen and pelvis was performed following the standard  protocol without IV contrast. COMPARISON:  01/22/2016 FINDINGS: Lower chest: There are small chronic bilateral pleural effusions and left lower lobe atelectasis. Extensive calcification of the mitral annulus. Hepatobiliary: No focal liver abnormality is seen. Status post cholecystectomy. No biliary dilatation. Pancreas: Diffuse pancreatic atrophy. Spleen: Normal in size without focal abnormality. Adrenals/Urinary Tract: Severe bilateral renal cortical atrophy. Bilobed complex hypodensity upper pole left kidney measures 2.3 x 1.8 cm and 1.6 x 1.5 cm. Hounsfield attenuation measures between 47 and 63 Hounsfield units, with thin peripheral calcification along the inferior aspect. A simple cyst was seen within this region previous, and this likely reflects a hemorrhagic cyst. There is a nonobstructing 5 mm calculus within the left renal pelvis. No right-sided calculi. No obstructive uropathy. Stable low-attenuation nodule within the right adrenal gland consistent with adenoma. The left adrenal is normal. Stomach/Bowel: There is a large amount of stool within the distal colon and rectum. No bowel obstruction or ileus. No bowel wall thickening or inflammatory change. Vascular/Lymphatic: Aortic atherosclerosis. No enlarged abdominal or pelvic lymph nodes. Reproductive: Status post hysterectomy. No adnexal masses. Other: Patient is markedly cachectic. Dependent body wall edema is seen within the lower abdomen and pelvis. No free intraperitoneal fluid or free gas. No abdominal wall hernia. Musculoskeletal: No acute displaced fracture. Bones are diffusely osteopenic. Reconstructed images demonstrate no additional findings. IMPRESSION: 1. Large amount of stool within the distal colon and rectum. 2. Chronic bilateral pleural effusions and left lower lobe atelectasis. 3. Bilobed complex hypodensity upper pole left kidney, likely hemorrhagic cyst. 4. Nonobstructing 5 mm left renal calculus. 5. Aortic Atherosclerosis  (ICD10-I70.0). Electronically Signed   By: Randa Ngo M.D.   On: 10/29/2020 15:50   DG Chest 2 View  Result Date: 10/29/2020 CLINICAL DATA:  Abdominal pain and vomiting EXAM: CHEST - 2 VIEW COMPARISON:  September 14, 2020 FINDINGS: The heart size and mediastinal contours are within normal limits. Aortic knob calcifications are seen. There is elevation of the left hemidiaphragm. Small bilateral effusions seen, left greater than right. There is diffuse osteopenia. IMPRESSION: Small bilateral pleural effusions, left greater than right. Electronically Signed   By: Prudencio Pair M.D.   On: 10/29/2020 18:17   Dialysis orders:  Center: [facility]  on MWF. Time: 3 hours 45 min, 3K, 2.25Ca, BFR 400, DFR 800, EDW 51.5kg, RUE AVG, no heparin, no ESA for Hb > 12 Hectorol 1 mcg IV q HD  Assessment/Plan **N/Vconstipation:  bowel regimen per primary - sees outpt GI MD - son is going to arrange f/u outpt; sounds like she's had issues with diarrhea but now consipation.   **Leukocytosis: afebrile.  Abx and cultures per primary.   **ESRD on HD:  Missed HD yesterday due to presenting to hospital - plan HD today the back to MWF schedule.  On midodrine chronically.   **Hyperkalemia:  Modest, HD today will fix.   **Anemia: mild, was hemoccult + in ED.  Serial H/H.  Hb stable in 12s.  **BMM:  Cont outpt hectorol  **A fib: amiodarone, digoxin.   Justin Mend 10/30/2020, 10:37 AM

## 2020-10-30 NOTE — Progress Notes (Signed)
PROGRESS NOTE    [name] James  ERX:[id number] DOB: 05-07-1936 DOA: 10/29/2020 PCP: Patrick Jupiter   Brief Narrative: [name] James is a 84 y.o. female PMH significant for atrial fibrillation, ESRD on hemodialysis, CHF, chronic hypoxic respiratory failure, who presents with several days of nausea, vomiting and constipation.  Last bowel movement almost a week ago.  Patient report rectal pain  Evaluation in the ED: Potassium 5.6, creatinine 6.2, hemoglobin 12.1, white blood cell 15.7 respiratory panel COVID-19 negative, occult blood positive.  CT abdomen and pelvis:Large amount of stool within the distal colon and rectum.   Assessment & Plan:   Active Problems:   Essential hypertension   DM type 2, uncontrolled, with renal complications (HCC)   Dyslipidemia   Hypotension   ESRD on dialysis (HCC)   Chronic diastolic (congestive) heart failure (HCC)   Paroxysmal atrial fibrillation (HCC)   Chronic respiratory failure with hypoxia (HCC)   Nausea & vomiting  1-Abdominal pain, nausea, vomiting. Secondary to constipation.  She has had 2-3 Bowel movement.  Started on Miralax and senna.  Sorbitol.  Discontinue Cholestyramine.  IV protonix.  On full liquid diet, advance as tolerated.  Occult blood positive. Hb stable.   2-Paroxysmal A fib;  Continue with amiodarone.   3-Leukocytosis; UA with 50 WBC, but she is on HD and urine out put is limited.  Follow urine culture.  On IV ceftriaxone , flagyl to cover for UTI and stercoral  colitis.    ESRD; on HD.  Nephrology consulted.  Received lokelma.  HD today.   Chronic Hypotension; continue with midodrine.   Chronic Respiratory failure; 2 L oxygen. Stable.     Pressure Injury 08/03/20 Sacrum Stage 2 -  Partial thickness loss of dermis presenting as a shallow open injury with a red, pink wound bed without slough. (Active)  08/03/20 2230  Location: Sacrum  Location Orientation:   Staging: Stage 2 -  Partial thickness loss of  dermis presenting as a shallow open injury with a red, pink wound bed without slough.  Wound Description (Comments):   Present on Admission: Yes     Estimated body mass index is 18.97 kg/m as calculated from the following:   Height as of this encounter: 5\' 5"  (1.651 m).   Weight as of this encounter: 51.7 kg.   DVT prophylaxis: SCD Code Status: Full code Family Communication: Daughter updated.  Disposition Plan:  Status is: Observation  The patient remains OBS appropriate and will d/c before 2 midnights.  Dispo: The patient is from: SNF              Anticipated d/c is to: SNF              Anticipated d/c date is: 1 day              Patient currently is not medically stable to d/c.        Consultants:   Nephrology   Procedures:   HD  Antimicrobials:    Subjective: She is feeling a little better. Had BM yesterday before and after suppository.  Had another BM this am.  She is planning on eating breakfast   Objective: Vitals:   10/30/20 0224 10/30/20 0300 10/30/20 0400 10/30/20 0500  BP: 118/64 (!) 94/52 (!) 103/50 (!) 113/52  Pulse: 66 62 63 (!) 59  Resp: 13 14 14 14   Temp:      TempSrc:      SpO2: 100% 100% 100% 100%  Weight:  Height:       No intake or output data in the 24 hours ending 10/30/20 0757 Filed Weights   10/29/20 1346  Weight: 51.7 kg    Examination:  General exam: Appears calm and comfortable  Respiratory system: Clear to auscultation. Respiratory effort normal. Cardiovascular system: S1 & S2 heard, RRR. No JVD, murmurs, rubs, gallops or clicks. No pedal edema. Gastrointestinal system: Abdomen is nondistended, soft and nontender. No organomegaly or masses felt. Normal bowel sounds heard. Central nervous system: Alert and oriented. No focal neurological deficits. Extremities: Symmetric 5 x 5 power.  Data Reviewed: I have personally reviewed following labs and imaging studies  CBC: Recent Labs  Lab 10/29/20 1348  10/30/20 0244  WBC 15.7* 20.3*  NEUTROABS 14.1*  --   HGB 12.1 12.6  HCT 38.1 39.6  MCV 96.5 96.6  PLT 246 017   Basic Metabolic Panel: Recent Labs  Lab 10/29/20 1348 10/29/20 1937 10/30/20 0244  NA 138  --  141  K 5.6*  --  5.7*  CL 97*  --  97*  CO2 22  --  23  GLUCOSE 155*  --  131*  BUN 66*  --  71*  CREATININE 6.28* 6.21* 6.33*  CALCIUM 8.3*  --  8.6*  MG 2.2  --   --    GFR: Estimated Creatinine Clearance: 5.6 mL/min (A) (by C-G formula based on SCr of 6.33 mg/dL (H)). Liver Function Tests: Recent Labs  Lab 10/29/20 1348  AST 21  ALT 19  ALKPHOS 88  BILITOT 1.2  PROT 6.4*  ALBUMIN 2.4*   Recent Labs  Lab 10/29/20 1348  LIPASE 26   No results for input(s): AMMONIA in the last 168 hours. Coagulation Profile: No results for input(s): INR, PROTIME in the last 168 hours. Cardiac Enzymes: No results for input(s): CKTOTAL, CKMB, CKMBINDEX, TROPONINI in the last 168 hours. BNP (last 3 results) No results for input(s): PROBNP in the last 8760 hours. HbA1C: No results for input(s): HGBA1C in the last 72 hours. CBG: No results for input(s): GLUCAP in the last 168 hours. Lipid Profile: No results for input(s): CHOL, HDL, LDLCALC, TRIG, CHOLHDL, LDLDIRECT in the last 72 hours. Thyroid Function Tests: No results for input(s): TSH, T4TOTAL, FREET4, T3FREE, THYROIDAB in the last 72 hours. Anemia Panel: No results for input(s): VITAMINB12, FOLATE, FERRITIN, TIBC, IRON, RETICCTPCT in the last 72 hours. Sepsis Labs: No results for input(s): PROCALCITON, LATICACIDVEN in the last 168 hours.  Recent Results (from the past 240 hour(s))  Respiratory Panel by RT PCR (Flu A&B, Covid) - Nasopharyngeal Swab     Status: None   Collection Time: 10/29/20  4:02 PM   Specimen: Nasopharyngeal Swab  Result Value Ref Range Status   SARS Coronavirus 2 by RT PCR NEGATIVE NEGATIVE Final    Comment: (NOTE) SARS-CoV-2 target nucleic acids are NOT DETECTED.  The SARS-CoV-2 RNA is  generally detectable in upper respiratoy specimens during the acute phase of infection. The lowest concentration of SARS-CoV-2 viral copies this assay can detect is 131 copies/mL. A negative result does not preclude SARS-Cov-2 infection and should not be used as the sole basis for treatment or other patient management decisions. A negative result may occur with  improper specimen collection/handling, submission of specimen other than nasopharyngeal swab, presence of viral mutation(s) within the areas targeted by this assay, and inadequate number of viral copies (<131 copies/mL). A negative result must be combined with clinical observations, patient history, and epidemiological information. The expected result  is Negative.  Fact Sheet for Patients:  PinkCheek.be  Fact Sheet for Healthcare Providers:  GravelBags.it  This test is no t yet approved or cleared by the [place] FDA and  has been authorized for detection and/or diagnosis of SARS-CoV-2 by FDA under an Emergency Use Authorization (EUA). This EUA will remain  in effect (meaning this test can be used) for the duration of the COVID-19 declaration under Section 564(b)(1) of the Act, 21 U.S.C. section 360bbb-3(b)(1), unless the authorization is terminated or revoked sooner.     Influenza A by PCR NEGATIVE NEGATIVE Final   Influenza B by PCR NEGATIVE NEGATIVE Final    Comment: (NOTE) The Xpert Xpress SARS-CoV-2/FLU/RSV assay is intended as an aid in  the diagnosis of influenza from Nasopharyngeal swab specimens and  should not be used as a sole basis for treatment. Nasal washings and  aspirates are unacceptable for Xpert Xpress SARS-CoV-2/FLU/RSV  testing.  Fact Sheet for Patients: PinkCheek.be  Fact Sheet for Healthcare Providers: GravelBags.it  This test is not yet approved or cleared by the [place] FDA and  has been authorized for detection and/or diagnosis of SARS-CoV-2 by  FDA under an Emergency Use Authorization (EUA). This EUA will remain  in effect (meaning this test can be used) for the duration of the  Covid-19 declaration under Section 564(b)(1) of the Act, 21  U.S.C. section 360bbb-3(b)(1), unless the authorization is  terminated or revoked. Performed at [facility], [place] [address], [place], [place] [address]          Radiology Studies: CT ABDOMEN PELVIS WO CONTRAST  Result Date: 10/29/2020 CLINICAL DATA:  Generalized abdominal pain EXAM: CT ABDOMEN AND PELVIS WITHOUT CONTRAST TECHNIQUE: Multidetector CT imaging of the abdomen and pelvis was performed following the standard protocol without IV contrast. COMPARISON:  01/22/2016 FINDINGS: Lower chest: There are small chronic bilateral pleural effusions and left lower lobe atelectasis. Extensive calcification of the mitral annulus. Hepatobiliary: No focal liver abnormality is seen. Status post cholecystectomy. No biliary dilatation. Pancreas: Diffuse pancreatic atrophy. Spleen: Normal in size without focal abnormality. Adrenals/Urinary Tract: Severe bilateral renal cortical atrophy. Bilobed complex hypodensity upper pole left kidney measures 2.3 x 1.8 cm and 1.6 x 1.5 cm. Hounsfield attenuation measures between 47 and 63 Hounsfield units, with thin peripheral calcification along the inferior aspect. A simple cyst was seen within this region previous, and this likely reflects a hemorrhagic cyst. There is a nonobstructing 5 mm calculus within the left renal pelvis. No right-sided calculi. No obstructive uropathy. Stable low-attenuation nodule within the right adrenal gland consistent with adenoma. The left adrenal is normal. Stomach/Bowel: There is a large amount of stool within the distal colon and rectum. No bowel obstruction or ileus. No bowel wall thickening or inflammatory change. Vascular/Lymphatic: Aortic  atherosclerosis. No enlarged abdominal or pelvic lymph nodes. Reproductive: Status post hysterectomy. No adnexal masses. Other: Patient is markedly cachectic. Dependent body wall edema is seen within the lower abdomen and pelvis. No free intraperitoneal fluid or free gas. No abdominal wall hernia. Musculoskeletal: No acute displaced fracture. Bones are diffusely osteopenic. Reconstructed images demonstrate no additional findings. IMPRESSION: 1. Large amount of stool within the distal colon and rectum. 2. Chronic bilateral pleural effusions and left lower lobe atelectasis. 3. Bilobed complex hypodensity upper pole left kidney, likely hemorrhagic cyst. 4. Nonobstructing 5 mm left renal calculus. 5. Aortic Atherosclerosis (ICD10-I70.0). Electronically Signed   By: Randa Ngo M.D.   On: 10/29/2020 15:50   DG Chest 2  View  Result Date: 10/29/2020 CLINICAL DATA:  Abdominal pain and vomiting EXAM: CHEST - 2 VIEW COMPARISON:  September 14, 2020 FINDINGS: The heart size and mediastinal contours are within normal limits. Aortic knob calcifications are seen. There is elevation of the left hemidiaphragm. Small bilateral effusions seen, left greater than right. There is diffuse osteopenia. IMPRESSION: Small bilateral pleural effusions, left greater than right. Electronically Signed   By: Prudencio Pair M.D.   On: 10/29/2020 18:17        Scheduled Meds: . allopurinol  100 mg Oral Daily  . amiodarone  200 mg Oral Daily  . ascorbic acid  500 mg Oral Daily  . aspirin  81 mg Oral Daily  . brimonidine  1 drop Both Eyes BID  . brinzolamide  1 drop Both Eyes BID  . calcium carbonate  1 tablet Oral BID  . Chlorhexidine Gluconate Cloth  6 each Topical Q0600  . collagenase  1 application Topical Daily  . diazepam  5 mg Oral QHS  . diclofenac Sodium  2 g Topical QHS  . fluticasone  2 spray Each Nare Q12H  . heparin  5,000 Units Subcutaneous Q8H  . ipratropium  2 spray Each Nare BID  . loratadine  10 mg Oral Daily   . melatonin  10 mg Oral QHS  . midodrine  10 mg Oral See admin instructions  . midodrine  2.5 mg Oral TID WC  . milk and molasses  1 enema Rectal Once  . multivitamin  1 tablet Oral QHS  . pantoprazole (PROTONIX) IV  40 mg Intravenous Q12H  . polyethylene glycol  17 g Oral BID  . senna  1 tablet Oral BID  . traMADol  50-100 mg Oral See admin instructions  . traZODone  50 mg Oral QHS   Continuous Infusions: . cefTRIAXone (ROCEPHIN)  IV    . metronidazole       LOS: 0 days    Time spent: 35 minutes.     Elmarie Shiley, MD [facility]   If 7PM-7AM, please contact night-coverage [email or web address]  10/30/2020, 7:57 AM

## 2020-10-31 DIAGNOSIS — R1084 Generalized abdominal pain: Secondary | ICD-10-CM

## 2020-10-31 DIAGNOSIS — R112 Nausea with vomiting, unspecified: Secondary | ICD-10-CM

## 2020-10-31 DIAGNOSIS — J9611 Chronic respiratory failure with hypoxia: Secondary | ICD-10-CM

## 2020-10-31 DIAGNOSIS — I700 Atherosclerosis of aorta: Secondary | ICD-10-CM

## 2020-10-31 DIAGNOSIS — I480 Paroxysmal atrial fibrillation: Secondary | ICD-10-CM

## 2020-10-31 LAB — URINE CULTURE

## 2020-10-31 LAB — CBC
HCT: 32.9 % — ABNORMAL LOW (ref 36.0–46.0)
Hemoglobin: 10.5 g/dL — ABNORMAL LOW (ref 12.0–15.0)
MCH: 30.9 pg (ref 26.0–34.0)
MCHC: 31.9 g/dL (ref 30.0–36.0)
MCV: 96.8 fL (ref 80.0–100.0)
Platelets: 246 10*3/uL (ref 150–400)
RBC: 3.4 MIL/uL — ABNORMAL LOW (ref 3.87–5.11)
RDW: 19 % — ABNORMAL HIGH (ref 11.5–15.5)
WBC: 10.2 10*3/uL (ref 4.0–10.5)
nRBC: 0 % (ref 0.0–0.2)

## 2020-10-31 LAB — RENAL FUNCTION PANEL
Albumin: 2.1 g/dL — ABNORMAL LOW (ref 3.5–5.0)
Anion gap: 14 (ref 5–15)
BUN: 42 mg/dL — ABNORMAL HIGH (ref 8–23)
CO2: 23 mmol/L (ref 22–32)
Calcium: 7.9 mg/dL — ABNORMAL LOW (ref 8.9–10.3)
Chloride: 100 mmol/L (ref 98–111)
Creatinine, Ser: 4.38 mg/dL — ABNORMAL HIGH (ref 0.44–1.00)
GFR, Estimated: 10 mL/min — ABNORMAL LOW (ref >60)
Glucose, Bld: 113 mg/dL — ABNORMAL HIGH (ref 70–99)
Phosphorus: 4.9 mg/dL — ABNORMAL HIGH (ref 2.5–4.6)
Potassium: 4.1 mmol/L (ref 3.5–5.1)
Sodium: 137 mmol/L (ref 135–145)

## 2020-10-31 MED ORDER — BISACODYL 10 MG RE SUPP: 10.0000 mg | Freq: Every day | RECTAL | Status: DC | PRN

## 2020-10-31 MED ORDER — PANTOPRAZOLE SODIUM 40 MG PO TBEC
40.0000 mg | DELAYED_RELEASE_TABLET | Freq: Two times a day (BID) | ORAL | Status: DC
  Administered 2020-10-31 – 2020-11-02 (×5): 40 mg via ORAL
  Filled 2020-10-31 (×5): qty 1

## 2020-10-31 NOTE — Evaluation (Signed)
Physical Therapy Evaluation Patient Details Name: [name] James MRN: [id number] DOB: 06-22-1936 Today's Date: 10/31/2020   History of Present Illness  84yo female c/o nausea and vomiting, constipation. PMH anxiety, OA, a-fib, breast CA, CHF, CKD, DM with peripheral neuropathy, gout, HLD, HTN, IBS, obesity, vertigo, back surgery, multiple angioplasties  Clinical Impression   Patient received in bed on her side, reports ongoing nausea and stomach pain. Able to roll side to side with MinA, reports she has been dependent and using hoyer lift for OOB for quite some time now. General strength in BLEs no more than 2 to 3-/5 at best. Left in bed positioned to comfort with RN aware of patient status. Feel she is really at her functional baseline with no skilled needs in the acute setting- recommend return to SNF. PT signing off, thank you for the opportunity to participate in her care!     Follow Up Recommendations SNF;Supervision/Assistance - 24 hour    Equipment Recommendations  Other (comment) (defer to next venue)    Recommendations for Other Services       Precautions / Restrictions Precautions Precautions: Fall Restrictions Weight Bearing Restrictions: No      Mobility  Bed Mobility Overal bed mobility: Needs Assistance Bed Mobility: Rolling Rolling: Min assist         General bed mobility comments: able to roll with MinA and use of bed rails, cues for hand placement    Transfers                 General transfer comment: deferred- dependent on hoyer lift at baseline  Ambulation/Gait             General Gait Details: unable  Stairs            Wheelchair Mobility    Modified Rankin (Stroke Patients Only)       Balance                                             Pertinent Vitals/Pain Pain Assessment: Faces Faces Pain Scale: Hurts even more Pain Location: stomach pain/nausea Pain Descriptors / Indicators:  Aching;Tender Pain Intervention(s): Limited activity within patient's tolerance;Monitored during session    Home Living Family/patient expects to be discharged to:: Skilled nursing facility                 Additional Comments: still at SNF, reports she is bedbound and only able to get out of bed with the use of a lift at baseline. Was getting some therapy at the SNF but it has been awhile since her report    Prior Function Level of Independence: Needs assistance   Gait / Transfers Assistance Needed: transfers via hoyer lift and spends most of her time in bed except for HD visits  ADL's / Homemaking Assistance Needed: pt is able to feed herself but requires assist for all other ADLs        Hand Dominance   Dominant Hand: Right    Extremity/Trunk Assessment   Upper Extremity Assessment Upper Extremity Assessment: Generalized weakness    Lower Extremity Assessment Lower Extremity Assessment: Generalized weakness (BLEs grossly no better than 2 to 3-/5 at best)    Cervical / Trunk Assessment Cervical / Trunk Assessment: Kyphotic  Communication   Communication: No difficulties  Cognition Arousal/Alertness: Awake/alert Behavior During Therapy: Anxious Overall Cognitive Status: Within Functional  Limits for tasks assessed                                        General Comments General comments (skin integrity, edema, etc.): did not get EOB to assess balance- has been bedbound/hoyer lift dependent for quite some time now    Exercises     Assessment/Plan    PT Assessment All further PT needs can be met in the next venue of care  PT Problem List Decreased strength;Decreased activity tolerance;Decreased safety awareness;Decreased balance;Decreased mobility;Decreased coordination       PT Treatment Interventions      PT Goals (Current goals can be found in the Care Plan section)  Acute Rehab PT Goals Patient Stated Goal: less pain PT Goal  Formulation: With patient Time For Goal Achievement: 11/14/20 Potential to Achieve Goals: Fair    Frequency     Barriers to discharge        Co-evaluation               AM-PAC PT "6 Clicks" Mobility  Outcome Measure Help needed turning from your back to your side while in a flat bed without using bedrails?: A Little Help needed moving from lying on your back to sitting on the side of a flat bed without using bedrails?: Total Help needed moving to and from a bed to a chair (including a wheelchair)?: Total Help needed standing up from a chair using your arms (e.g., wheelchair or bedside chair)?: Total Help needed to walk in hospital room?: Total Help needed climbing 3-5 steps with a railing? : Total 6 Click Score: 8    End of Session   Activity Tolerance: Patient limited by pain Patient left: in bed;with call bell/phone within reach Nurse Communication: Mobility status PT Visit Diagnosis: Muscle weakness (generalized) (M62.81);Other abnormalities of gait and mobility (R26.89)    Time: 7944-4619 PT Time Calculation (min) (ACUTE ONLY): 14 min   Charges:   PT Evaluation $PT Eval Moderate Complexity: 1 Mod          Windell Norfolk, DPT, PN1   Supplemental Physical Therapist [facility]    Pager [phone] Acute Rehab Office [phone]

## 2020-10-31 NOTE — Hospital Course (Signed)
84 year old woman from SNF PMH including ESRD presented with worsening abdominal pain, nausea, vomiting, constipation refractory to medication.  CT scan showed significant stool burden.  Not impacted. Admitted for nausea, vomiting, abdominal pain secondary to constipation.  Cholestyramine was discontinued.  Bowel regimen pursued.

## 2020-10-31 NOTE — Consult Note (Addendum)
[facility] Progress Note   Subjective:  Had dialysis yesterday net UF 1.5L  Seen in room, feels better. Had BM per RN    Objective Vitals:   10/31/20 0000 10/31/20 0018 10/31/20 0439 10/31/20 0948  BP: (!) 99/59 134/65 116/60 (!) 161/68  Pulse: 93 88 79 81  Resp:  17 20 20   Temp:  98 F (36.7 C) 98.3 F (36.8 C) (!) 97.4 F (36.3 C)  TempSrc:  Oral Oral Oral  SpO2:  100% 100% 100%  Weight:  50.1 kg    Height:         Additional Objective Labs: Basic Metabolic Panel: Recent Labs  Lab 10/29/20 1348 10/29/20 1348 10/29/20 1937 10/30/20 0244 10/31/20 0455  NA 138  --   --  141 137  K 5.6*  --   --  5.7* 4.1  CL 97*  --   --  97* 100  CO2 22  --   --  23 23  GLUCOSE 155*  --   --  131* 113*  BUN 66*  --   --  71* 42*  CREATININE 6.28*   < > 6.21* 6.33* 4.38*  CALCIUM 8.3*  --   --  8.6* 7.9*  PHOS  --   --   --   --  4.9*   < > = values in this interval not displayed.   CBC: Recent Labs  Lab 10/29/20 1348 10/30/20 0244 10/31/20 0455  WBC 15.7* 20.3* 10.2  NEUTROABS 14.1*  --   --   HGB 12.1 12.6 10.5*  HCT 38.1 39.6 32.9*  MCV 96.5 96.6 96.8  PLT 246 296 246   Blood Culture    Component Value Date/Time   SDES BLOOD LEFT ARM 10/30/2020 1525   SPECREQUEST  10/30/2020 1525    BOTTLES DRAWN AEROBIC ONLY Blood Culture results may not be optimal due to an inadequate volume of blood received in culture bottles   CULT  10/30/2020 1525    NO GROWTH < 24 HOURS Performed at [facility] [address], [place], [place] [address]    REPTSTATUS PENDING 10/30/2020 1525     Physical Exam General: Frail woman, alert, nad  Heart: RRR  Lungs: clear, bilaterally  Abdomen: soft non-tender  Extremities: No sig LE edema  Dialysis Access: RUE AVF +bruit   Medications: . cefTRIAXone (ROCEPHIN)  IV Stopped (10/30/20 1015)  . metronidazole 100 mL/hr at 10/31/20 0900   . allopurinol  100 mg Oral Daily  . amiodarone  200 mg Oral Daily  .  ascorbic acid  500 mg Oral Daily  . aspirin  81 mg Oral Daily  . brimonidine  1 drop Both Eyes BID  . brinzolamide  1 drop Both Eyes BID  . calcium carbonate  1 tablet Oral BID  . Chlorhexidine Gluconate Cloth  6 each Topical Q0600  . collagenase  1 application Topical Daily  . diazepam  5 mg Oral QHS  . diclofenac Sodium  2 g Topical QHS  . doxercalciferol  1 mcg Intravenous Q M,W,F-HD  . fluticasone  2 spray Each Nare Q12H  . ipratropium  2 spray Each Nare BID  . loratadine  10 mg Oral Daily  . melatonin  10 mg Oral QHS  . midodrine  10 mg Oral Q M,W,F-HD  . midodrine  2.5 mg Oral TID WC  . milk and molasses  1 enema Rectal Once  . multivitamin  1 tablet Oral QHS  . pantoprazole (  PROTONIX) IV  40 mg Intravenous Q12H  . polyethylene glycol  17 g Oral BID  . senna  1 tablet Oral BID  . traZODone  50 mg Oral QHS    Dialysis Orders:  [facility] Time: 3 hours 45 min, 3K, 2.25Ca, BFR 400, DFR 800, EDW 51.5kg, RUE AVG, no heparin, no ESA for Hb > 12 Hectorol 1 mcg IV q HD  Assessment/Plan: **N/Vconstipation:  bowel regimen per primary - sees outpt GI MD - son is going to arrange f/u outpt; sounds like she's had issues with diarrhea but now consipation.   **Leukocytosis: afebrile.  Abx and cultures per primary. Improving. BCx ngtd  **ESRD on HD:  MWF schedule. HD off schedule 11/9. No urgent HD needs today -next HD 11/11 off schedule.  On midodrine chronically.   **Hyperkalemia:  Resolved with HD.   **Anemia: mild, was hemoccult + in ED.  Serial H/H.  Hb 12.6>10.5. follow.   **BMM:  Cont outpt hectorol  **A fib: amiodarone, digoxin.   Lynnda Child PA-C [facility] 10/31/2020,10:00 AM

## 2020-10-31 NOTE — Progress Notes (Signed)
PROGRESS NOTE  [name] James SPQ:[id number] DOB: 1936/11/23 DOA: 10/29/2020 PCP: Patrick Jupiter  Brief History   84 year old woman from SNF PMH including ESRD presented with worsening abdominal pain, nausea, vomiting, constipation refractory to medication.  CT scan showed significant stool burden.  Not impacted. Admitted for nausea, vomiting, abdominal pain secondary to constipation.  Cholestyramine was discontinued.  Bowel regimen pursued.   A & P  Nausea, vomiting, abdominal pain secondary to constipation.  No complicating features noted on CT or clinically.  CT did show a large amount of stool in the colon and rectum.  No evidence of stercoral colitis on CT --Cholestyramine discontinued --BM noted but still has nausea, abd pain; will continue bowel regimen --Follows with outpatient GI, son will arrange outpatient follow-up  Leukocytosis --resolved. Urinalysis was abnormal but urine culture is unrevealing. Suspect acute reaction to vomiting, dehydration.  --Stop antibiotics and monitor, no evidence of colitis or UTI.  PAF --in SR on telemetry. Continue amiodarone --stop telemetry  ESRD, with hyperkalemia on admission, missed HD 11/8 --Hyperkalemia resolved with Lokelma --next HD 11/11 per nephrology   GERD --PPI  Anemia of ESRD --appears stable  Chronic diastolic CHF --appears stable. Treatment is HD.  Chronic hypotension --Stable. Continue midodrine  Chronic resp failure on 2 L nasal cannula --stable, continue supplemental oxygen  Aortic atherosclerosis --No inpt treatment. follow-up as an outpatient  Disposition Plan:  Discussion: still nausea, abd pain; continue bowel regimen, treatment as above.  Status is: Inpatient  Remains inpatient appropriate because:Inpatient level of care appropriate due to severity of illness   Dispo: The patient is from: SNF              Anticipated d/c is to: SNF              Anticipated d/c date is: 2 days              Patient  currently is not medically stable to d/c.  DVT prophylaxis: SCDs Start: 10/29/20 1937   Code Status: Full Code Family Communication: none  Murray Hodgkins, MD  [facility] Direct contact: see [email or web address] (further directions at bottom of note if needed) 7PM-7AM contact night coverage as at bottom of note 10/31/2020, 12:22 PM  LOS: 1 day   Significant Hospital Events   .    Consults:  . Nephrology   Procedures:  . Routine HD  Significant Diagnostic Tests:  Marland Kitchen    Micro Data:  . UC no sig growth . BC NGTD   Antimicrobials:  .   Interval History/Subjective  CC: f/u n/v/abd pain  Pt reports ongoing nausea, but no vomitingtoday. Still has abd pain.  Large bowel movement recorded yesterday afternoon. Per RN ate a good breakfast.  Objective   Vitals:  Vitals:   10/31/20 0439 10/31/20 0948  BP: 116/60 (!) 161/68  Pulse: 79 81  Resp: 20 20  Temp: 98.3 F (36.8 C) (!) 97.4 F (36.3 C)  SpO2: 100% 100%    Exam:  Physical Exam Constitutional:      General: She is not in acute distress.    Appearance: She is not toxic-appearing.     Comments: Appears chronically ill  Cardiovascular:     Rate and Rhythm: Normal rate and regular rhythm.     Heart sounds: No murmur heard.  No gallop.   Pulmonary:     Effort: Pulmonary effort is normal. No respiratory distress.     Breath sounds: Normal breath sounds. No wheezing or rales.  Abdominal:  General: Abdomen is flat. Bowel sounds are normal. There is no distension.     Palpations: Abdomen is soft. There is no mass.     Tenderness: There is no abdominal tenderness.  Neurological:     Mental Status: She is alert.  Psychiatric:        Mood and Affect: Mood normal.        Behavior: Behavior normal.    I have personally reviewed the following:   Today's Data  . Basic metabolic panel consistent with end-stage renal disease.  Potassium within normal limits.  Hemoglobin likely at baseline compared to previous  studies in August.  Was probably hemoconcentrated on admission.  Scheduled Meds: . allopurinol  100 mg Oral Daily  . amiodarone  200 mg Oral Daily  . ascorbic acid  500 mg Oral Daily  . aspirin  81 mg Oral Daily  . brimonidine  1 drop Both Eyes BID  . brinzolamide  1 drop Both Eyes BID  . calcium carbonate  1 tablet Oral BID  . Chlorhexidine Gluconate Cloth  6 each Topical Q0600  . collagenase  1 application Topical Daily  . diazepam  5 mg Oral QHS  . diclofenac Sodium  2 g Topical QHS  . doxercalciferol  1 mcg Intravenous Q M,W,F-HD  . fluticasone  2 spray Each Nare Q12H  . ipratropium  2 spray Each Nare BID  . loratadine  10 mg Oral Daily  . melatonin  10 mg Oral QHS  . midodrine  10 mg Oral Q M,W,F-HD  . midodrine  2.5 mg Oral TID WC  . milk and molasses  1 enema Rectal Once  . multivitamin  1 tablet Oral QHS  . pantoprazole  40 mg Oral BID  . polyethylene glycol  17 g Oral BID  . senna  1 tablet Oral BID  . traZODone  50 mg Oral QHS   Continuous Infusions:   Active Problems:   Essential hypertension   DM type 2, uncontrolled, with renal complications (HCC)   Dyslipidemia   Hypotension   ESRD on dialysis ([place])   Chronic diastolic (congestive) heart failure (HCC)   Paroxysmal atrial fibrillation (HCC)   Chronic respiratory failure with hypoxia (HCC)   Nausea & vomiting   Intractable vomiting   LOS: 1 day   How to contact the [facility] Attending or Consulting provider [place] or covering provider during after hours [place], for this patient?  1. Check the care team in [facility] and look for a) attending/consulting TRH provider listed and b) the [facility] team listed 2. Log into [email or web address] and use [place]'s universal password to access. If you do not have the password, please contact the hospital operator. 3. Locate the [facility] provider you are looking for under [facility] and page to a number that you can be directly reached. 4. If you still have difficulty reaching the  provider, please page the [facility] (Director on Call) for the Hospitalists listed on amion for assistance.

## 2020-10-31 NOTE — Plan of Care (Signed)
°  Problem: Education: °Goal: Knowledge of General Education information will improve °Description: Including pain rating scale, medication(s)/side effects and non-pharmacologic comfort measures °Outcome: Progressing °  °Problem: Nutrition: °Goal: Adequate nutrition will be maintained °Outcome: Progressing °  °Problem: Skin Integrity: °Goal: Risk for impaired skin integrity will decrease °Outcome: Progressing °  °

## 2020-11-01 DIAGNOSIS — Z992 Dependence on renal dialysis: Secondary | ICD-10-CM

## 2020-11-01 DIAGNOSIS — N186 End stage renal disease: Secondary | ICD-10-CM

## 2020-11-01 LAB — CBC
HCT: 29.5 % — ABNORMAL LOW (ref 36.0–46.0)
Hemoglobin: 9.7 g/dL — ABNORMAL LOW (ref 12.0–15.0)
MCH: 31.7 pg (ref 26.0–34.0)
MCHC: 32.9 g/dL (ref 30.0–36.0)
MCV: 96.4 fL (ref 80.0–100.0)
Platelets: 259 10*3/uL (ref 150–400)
RBC: 3.06 MIL/uL — ABNORMAL LOW (ref 3.87–5.11)
RDW: 19 % — ABNORMAL HIGH (ref 11.5–15.5)
WBC: 9.9 10*3/uL (ref 4.0–10.5)
nRBC: 0 % (ref 0.0–0.2)

## 2020-11-01 LAB — BASIC METABOLIC PANEL
Anion gap: 14 (ref 5–15)
BUN: 50 mg/dL — ABNORMAL HIGH (ref 8–23)
CO2: 21 mmol/L — ABNORMAL LOW (ref 22–32)
Calcium: 7.7 mg/dL — ABNORMAL LOW (ref 8.9–10.3)
Chloride: 102 mmol/L (ref 98–111)
Creatinine, Ser: 4.93 mg/dL — ABNORMAL HIGH (ref 0.44–1.00)
GFR, Estimated: 8 mL/min — ABNORMAL LOW (ref >60)
Glucose, Bld: 123 mg/dL — ABNORMAL HIGH (ref 70–99)
Potassium: 4 mmol/L (ref 3.5–5.1)
Sodium: 137 mmol/L (ref 135–145)

## 2020-11-01 LAB — HEPATITIS B SURFACE ANTIBODY, QUANTITATIVE: Hep B S AB Quant (Post): <3.1 m[IU]/mL — ABNORMAL LOW (ref >9.9)

## 2020-11-01 MED ORDER — MIDODRINE HCL 5 MG PO TABS
ORAL_TABLET | ORAL | Status: AC
  Filled 2020-11-01: qty 2

## 2020-11-01 MED ORDER — DOXERCALCIFEROL 4 MCG/2ML IV SOLN
INTRAVENOUS | Status: AC
  Administered 2020-11-01: 1 ug via INTRAVENOUS
  Filled 2020-11-01: qty 2

## 2020-11-01 NOTE — Consult Note (Addendum)
[facility] Progress Note   Subjective:   Says feeling a bit better = taking only liquids so far, wishes to advance to bland solids. Seen on HD = no issues.    Objective Vitals:   11/01/20 0655 11/01/20 0730 11/01/20 0800 11/01/20 0830  BP:  (!) 148/76 (!) 143/71 (!) 141/74  Pulse:   78 81  Resp:      Temp: (!) 97.5 F (36.4 C)     TempSrc: Oral     SpO2: 100%     Weight: 54.6 kg     Height:         Additional Objective Labs: Basic Metabolic Panel: Recent Labs  Lab 10/30/20 0244 10/31/20 0455 11/01/20 0124  NA 141 137 137  K 5.7* 4.1 4.0  CL 97* 100 102  CO2 23 23 21*  GLUCOSE 131* 113* 123*  BUN 71* 42* 50*  CREATININE 6.33* 4.38* 4.93*  CALCIUM 8.6* 7.9* 7.7*  PHOS  --  4.9*  --    CBC: Recent Labs  Lab 10/29/20 1348 10/29/20 1348 10/30/20 0244 10/31/20 0455 11/01/20 0124  WBC 15.7*   < > 20.3* 10.2 9.9  NEUTROABS 14.1*  --   --   --   --   HGB 12.1   < > 12.6 10.5* 9.7*  HCT 38.1   < > 39.6 32.9* 29.5*  MCV 96.5  --  96.6 96.8 96.4  PLT 246   < > 296 246 259   < > = values in this interval not displayed.   Blood Culture    Component Value Date/Time   SDES BLOOD LEFT ARM 10/30/2020 1525   SPECREQUEST  10/30/2020 1525    BOTTLES DRAWN AEROBIC ONLY Blood Culture results may not be optimal due to an inadequate volume of blood received in culture bottles   CULT  10/30/2020 1525    NO GROWTH 2 DAYS Performed at [facility], [place] [address] [place], [place] [address]    REPTSTATUS PENDING 10/30/2020 1525     Physical Exam General: Frail woman, alert, nad  Heart: RRR  Lungs: clear, bilaterally  Abdomen: soft non-tender  Extremities: No sig LE edema  Dialysis Access: RUE AVF +bruit Qb 400 on HD  Medications:  . allopurinol  100 mg Oral Daily  . amiodarone  200 mg Oral Daily  . ascorbic acid  500 mg Oral Daily  . aspirin  81 mg Oral Daily  . brimonidine  1 drop Both Eyes BID  . brinzolamide  1 drop Both Eyes BID  .  calcium carbonate  1 tablet Oral BID  . Chlorhexidine Gluconate Cloth  6 each Topical Q0600  . collagenase  1 application Topical Daily  . diazepam  5 mg Oral QHS  . diclofenac Sodium  2 g Topical QHS  . doxercalciferol  1 mcg Intravenous Q M,W,F-HD  . fluticasone  2 spray Each Nare Q12H  . ipratropium  2 spray Each Nare BID  . loratadine  10 mg Oral Daily  . melatonin  10 mg Oral QHS  . midodrine      . midodrine  10 mg Oral Q M,W,F-HD  . midodrine  2.5 mg Oral TID WC  . milk and molasses  1 enema Rectal Once  . multivitamin  1 tablet Oral QHS  . pantoprazole  40 mg Oral BID  . polyethylene glycol  17 g Oral BID  . senna  1 tablet Oral BID  . traZODone  50 mg  Oral QHS    [facility] Orders:  [facility] Time: 3 hours 45 min, 3K, 2.25Ca, BFR 400, DFR 800, EDW 51.5kg, RUE AVG, no heparin, no ESA for Hb > 12 Hectorol 1 mcg IV q HD  Assessment/Plan: **N/Vconstipation:  bowel regimen per primary - sees outpt GI MD - son is going to arrange f/u outpt; sounds like she's had issues with diarrhea but now consipation.   **Leukocytosis: afebrile.   Improved. BCx ngtd, imaging w/o concerning features.  Not on abx.  **ESRD on HD:  MWF schedule. HD off schedule 11/9 and again 11/11.  Will plan next HD Monday per usual schedule.  On midodrine chronically.   **Hyperkalemia:  Resolved with HD.   **Anemia: mild, was hemoccult + in ED.  Serial H/H.  Hb 12.6>10.5>9.7. No indications for transfusion currently.  May need to initiate ESA outp..   **BMM:  Cont outpt hectorol  **A fib: amiodarone, digoxin.   dispo - ok to d/c from renal perspective.  Jannifer Hick MD [facility] Pager [phone]

## 2020-11-01 NOTE — Plan of Care (Signed)
  Problem: Education: Goal: Knowledge of General Education information will improve Description: Including pain rating scale, medication(s)/side effects and non-pharmacologic comfort measures Outcome: Progressing   Problem: Health Behavior/Discharge Planning: Goal: Ability to manage health-related needs will improve Outcome: Progressing   Problem: Activity: Goal: Risk for activity intolerance will decrease Outcome: Progressing   

## 2020-11-01 NOTE — Progress Notes (Signed)
PROGRESS NOTE  [name] James [id number]:[id number] DOB: [birth year] DOA: 10/29/2020 PCP: Patrick Jupiter  Brief History   84 year old woman from SNF PMH including ESRD presented with worsening abdominal pain, nausea, vomiting, constipation refractory to medication.  CT scan showed significant stool burden.  Not impacted. Admitted for nausea, vomiting, abdominal pain secondary to constipation.  Cholestyramine was discontinued.  Bowel regimen pursued.   A & P  Nausea, vomiting, abdominal pain secondary to constipation.  No complicating features noted on CT or clinically.  CT did show a large amount of stool in the colon and rectum.  No evidence of stercoral colitis on CT. Cholestyramine discontinued --improved, still has nausea but tolerating liquids. Advance to solid and monitor. --Follows with outpatient GI, son will arrange outpatient follow-up  Leukocytosis --resolved. Urinalysis was abnormal but urine culture is unrevealing. Suspect acute reaction to vomiting, dehydration.  --antibiotics stopped  PAF --continue amiodarone  ESRD, with hyperkalemia on admission, missed HD 11/8 --Hyperkalemia resolved with Lokelma --next HD 11/15  GERD --continue PPI  Anemia of ESRD --stable  Chronic diastolic CHF --appears stable. Treatment is HD.  Chronic hypotension --Stable. Continue midodrine  Chronic resp failure on 2 L nasal cannula --stable, continue supplemental oxygen  Aortic atherosclerosis --No inpt treatment. follow-up as an outpatient  Disposition Plan:  Discussion: better, advance diet  Status is: Inpatient  Remains inpatient appropriate because:Inpatient level of care appropriate due to severity of illness   Dispo: The patient is from: SNF              Anticipated d/c is to: SNF              Anticipated d/c date is: 2 days              Patient currently is not medically stable to d/c.  DVT prophylaxis: SCDs Start: 10/29/20 1937   Code Status: Full Code Family  Communication: I updated daughter by telephone  Murray Hodgkins, MD  [facility] Direct contact: see [email or web address] (further directions at bottom of note if needed) 7PM-7AM contact night coverage as at bottom of note 11/01/2020, 3:56 PM  LOS: 2 days   Significant Hospital Events   .    Consults:  . Nephrology   Procedures:  . Routine HD  Significant Diagnostic Tests:  Marland Kitchen    Micro Data:  . UC no sig growth . BC NGTD   Antimicrobials:  .   Interval History/Subjective  CC: f/u n/v/abd pain  Feels better today, still nausea but tolerating liquids. Bowels moving. Minimal abd pain.  Objective   Vitals:  Vitals:   11/01/20 1111 11/01/20 1200  BP: (!) 131/59 126/78  Pulse: 80 80  Resp: 19 20  Temp: 97.8 F (36.6 C) 98.3 F (36.8 C)  SpO2: 100% 100%    Exam: Constitutional:   . Appears calm and comfortable, better today ENMT:  . grossly normal hearing  Respiratory:  . CTA bilaterally, no w/r/r.  . Respiratory effort normal.  Cardiovascular:  . RRR, no m/r/g . No LE extremity edema   Abdomen:  . +BS, soft, ntnd Psychiatric:  . Mental status o Mood, affect appropriate   I have personally reviewed the following:   Today's Data   BMP noted, K+ WNL  Hgb stable at 9.7  Scheduled Meds: . allopurinol  100 mg Oral Daily  . amiodarone  200 mg Oral Daily  . ascorbic acid  500 mg Oral Daily  . aspirin  81 mg Oral Daily  . brimonidine  1 drop Both Eyes BID  . brinzolamide  1 drop Both Eyes BID  . calcium carbonate  1 tablet Oral BID  . Chlorhexidine Gluconate Cloth  6 each Topical Q0600  . collagenase  1 application Topical Daily  . diazepam  5 mg Oral QHS  . diclofenac Sodium  2 g Topical QHS  . doxercalciferol  1 mcg Intravenous Q M,W,F-HD  . fluticasone  2 spray Each Nare Q12H  . ipratropium  2 spray Each Nare BID  . loratadine  10 mg Oral Daily  . melatonin  10 mg Oral QHS  . midodrine      . midodrine  10 mg Oral Q M,W,F-HD  . midodrine  2.5  mg Oral TID WC  . milk and molasses  1 enema Rectal Once  . multivitamin  1 tablet Oral QHS  . pantoprazole  40 mg Oral BID  . polyethylene glycol  17 g Oral BID  . senna  1 tablet Oral BID  . traZODone  50 mg Oral QHS   Continuous Infusions:   Active Problems:   Essential hypertension   DM type 2, uncontrolled, with renal complications (HCC)   Dyslipidemia   Hypotension   ESRD on dialysis ([place])   Chronic diastolic (congestive) heart failure (HCC)   Paroxysmal atrial fibrillation (HCC)   Chronic respiratory failure with hypoxia (HCC)   Nausea & vomiting   Intractable vomiting   LOS: 2 days   How to contact the [facility] Attending or Consulting provider [place] or covering provider during after hours [place], for this patient?  1. Check the care team in [facility] and look for a) attending/consulting TRH provider listed and b) the [facility] team listed 2. Log into [email or web address] and use [place]'s universal password to access. If you do not have the password, please contact the hospital operator. 3. Locate the [facility] provider you are looking for under [facility] and page to a number that you can be directly reached. 4. If you still have difficulty reaching the provider, please page the [facility] (Director on Call) for the Hospitalists listed on amion for assistance.

## 2020-11-01 NOTE — Plan of Care (Signed)
  Problem: Education: Goal: Knowledge of General Education information will improve Description Including pain rating scale, medication(s)/side effects and non-pharmacologic comfort measures Outcome: Progressing   Problem: Health Behavior/Discharge Planning: Goal: Ability to manage health-related needs will improve Outcome: Progressing   

## 2020-11-01 NOTE — Evaluation (Signed)
Occupational Therapy Evaluation Patient Details Name: [name] James MRN: [id number] DOB: [birth year] Today's Date: 11/01/2020    History of Present Illness 84yo female c/o nausea and vomiting, constipation. PMH anxiety, OA, a-fib, breast CA, CHF, CKD, DM with peripheral neuropathy, gout, HLD, HTN, IBS, obesity, vertigo, back surgery, multiple angioplasties   Clinical Impression   Pt. Was seen to assess for OT needs. Pt. Is at baseline performance with ADLs . Pt. Is able to feed herself and is able to perform grooming tasks with assist. Pt. Is dep with remaining ADLs. pnt is a [employer] fast streamer for transfers. OT to sign off.     Follow Up Recommendations  SNF    Equipment Recommendations  None recommended by OT    Recommendations for Other Services       Precautions / Restrictions Precautions Precautions: Fall Restrictions Weight Bearing Restrictions: No      Mobility Bed Mobility     Rolling: Min assist         General bed mobility comments: with use of bed rails.     Transfers Overall transfer level: Needs assistance               General transfer comment: Pt. is a hoyer transfer at facility    Balance                                           ADL either performed or assessed with clinical judgement   ADL Overall ADL's : At baseline                                       General ADL Comments: Pt. is dep for bathing and dressing. min a with grooming  pt is able to feed herself     Vision Baseline Vision/History: No visual deficits Vision Assessment?: No apparent visual deficits     Perception     Praxis      Pertinent Vitals/Pain Pain Assessment: 0-10 Pain Score: 5  Pain Location: stomach pain/nausea Pain Descriptors / Indicators: Aching;Tender Pain Intervention(s): Limited activity within patient's tolerance     Hand Dominance Right   Extremity/Trunk Assessment Upper Extremity Assessment Upper Extremity  Assessment: Generalized weakness   Lower Extremity Assessment Lower Extremity Assessment: Defer to PT evaluation   Cervical / Trunk Assessment Cervical / Trunk Assessment: Kyphotic   Communication Communication Communication: No difficulties   Cognition Arousal/Alertness: Awake/alert Behavior During Therapy: WFL for tasks assessed/performed Overall Cognitive Status: Within Functional Limits for tasks assessed                                     General Comments       Exercises Exercises:  (Pt. states she does exercise her arms and is able to perform)   Shoulder Instructions      Home Living Family/patient expects to be discharged to:: Skilled nursing facility                                 Additional Comments: still at SNF, reports she is bedbound and only able to get out of bed with the use of a lift at baseline. Was  getting some therapy at the SNF but it has been awhile since her report      Prior Functioning/Environment Level of Independence: Needs assistance  Gait / Transfers Assistance Needed: transfers via hoyer lift and spends most of her time in bed except for HD visits ADL's / Homemaking Assistance Needed: pt is able to feed herself but requires assist for all other ADLs            OT Problem List:        OT Treatment/Interventions:      OT Goals(Current goals can be found in the care plan section) Acute Rehab OT Goals Patient Stated Goal: to feel better OT Goal Formulation: With patient  OT Frequency:     Barriers to D/C:            Co-evaluation              AM-PAC OT "6 Clicks" Daily Activity     Outcome Measure Help from another person eating meals?: A Little Help from another person taking care of personal grooming?: A Lot Help from another person toileting, which includes using toliet, bedpan, or urinal?: Total Help from another person bathing (including washing, rinsing, drying)?: Total Help from  another person to put on and taking off regular upper body clothing?: Total Help from another person to put on and taking off regular lower body clothing?: Total 6 Click Score: 9   End of Session Nurse Communication:  (ok therapy)  Activity Tolerance: Patient limited by fatigue Patient left: in bed;with call bell/phone within reach;with bed alarm set  OT Visit Diagnosis: Other abnormalities of gait and mobility (R26.89)                Time: 8185-6314 OT Time Calculation (min): 27 min Charges:  OT General Charges $OT Visit: 1 Visit OT Evaluation $OT Eval Low Complexity: 1 Low  Reece Packer OT/L  [place] 11/01/2020, 1:03 PM

## 2020-11-02 DIAGNOSIS — I5032 Chronic diastolic (congestive) heart failure: Secondary | ICD-10-CM

## 2020-11-02 DIAGNOSIS — E1165 Type 2 diabetes mellitus with hyperglycemia: Secondary | ICD-10-CM

## 2020-11-02 DIAGNOSIS — E785 Hyperlipidemia, unspecified: Secondary | ICD-10-CM

## 2020-11-02 DIAGNOSIS — E1129 Type 2 diabetes mellitus with other diabetic kidney complication: Secondary | ICD-10-CM

## 2020-11-02 LAB — CBC
HCT: 27.7 % — ABNORMAL LOW (ref 36.0–46.0)
Hemoglobin: 8.6 g/dL — ABNORMAL LOW (ref 12.0–15.0)
MCH: 30.4 pg (ref 26.0–34.0)
MCHC: 31 g/dL (ref 30.0–36.0)
MCV: 97.9 fL (ref 80.0–100.0)
Platelets: 203 10*3/uL (ref 150–400)
RBC: 2.83 MIL/uL — ABNORMAL LOW (ref 3.87–5.11)
RDW: 19 % — ABNORMAL HIGH (ref 11.5–15.5)
WBC: 9.6 10*3/uL (ref 4.0–10.5)
nRBC: 0 % (ref 0.0–0.2)

## 2020-11-02 LAB — SARS CORONAVIRUS 2 BY RT PCR (HOSPITAL ORDER, PERFORMED IN ~~LOC~~ HOSPITAL LAB): SARS Coronavirus 2: NEGATIVE

## 2020-11-02 MED ORDER — TRAZODONE HCL 50 MG PO TABS: 50.0000 mg | ORAL_TABLET | Freq: Every day | ORAL | 0 refills | Status: AC

## 2020-11-02 MED ORDER — DIAZEPAM 5 MG PO TABS: 5.0000 mg | ORAL_TABLET | Freq: Every day | ORAL | 0 refills | Status: AC

## 2020-11-02 MED ORDER — TRAMADOL HCL 50 MG PO TABS
50.0000 mg | ORAL_TABLET | ORAL | 0 refills | Status: AC
Start: 2020-11-02 — End: ?

## 2020-11-02 MED ORDER — PANTOPRAZOLE SODIUM 40 MG PO TBEC: 40.0000 mg | DELAYED_RELEASE_TABLET | Freq: Two times a day (BID) | ORAL | 0 refills | Status: AC

## 2020-11-02 MED ORDER — POLYETHYLENE GLYCOL 3350 17 G PO PACK: 17.0000 g | PACK | Freq: Every day | ORAL | 0 refills | Status: AC | PRN

## 2020-11-02 NOTE — Discharge Instructions (Signed)

## 2020-11-02 NOTE — Care Management Important Message (Signed)
Important Message  Patient Details  Name: [name] James MRN: [id number] Date of Birth: [birth year]   Medicare Important Message Given:  Yes     Emerald Shor P Copeland 11/02/2020, 11:20 AM

## 2020-11-02 NOTE — NC FL2 (Signed)
MEDICAID FL2 LEVEL OF CARE SCREENING TOOL     IDENTIFICATION  Patient Name: [name] James Birthdate: October 31, 1936 Sex: female Admission Date (Current Location): 10/29/2020  [place] and [place] Number:  Kathleen Argue [id number] [place] and Address:         Provider Number: [id number]  Attending Physician Name and Address:  [place] Eric J, DO  Relative Name and Phone Number:  Coralyn Mark - daughter - [phone]    Current Level of Care: Hospital Recommended Level of Care: [facility]) Prior Approval Number:    Date Approved/Denied:   PASRR Number: [id number]  Discharge Plan: SNF    Current Diagnoses: Patient Active Problem List   Diagnosis Date Noted  . Atrial fibrillation with RVR ([place]) 08/03/2020  . Nausea and vomiting 03/14/2019  . Chronic diarrhea 03/14/2019  . Dysuria 03/14/2019  . UTI (urinary tract infection) 03/14/2019  . Aortic atherosclerosis ([place]) 12/30/2018  . Hypoalbuminemia due to protein-calorie malnutrition ([place]) 12/30/2018  . Tachycardia-bradycardia syndrome ([place])   . Symptomatic bradycardia 08/24/2018  . Gout 08/23/2018  . Anxiety 08/23/2018  . Pneumothorax, left 10/13/2017  . Multiple lung nodules on CT 10/06/2017  . Pleural effusion, left 10/06/2017  . Diabetic polyneuropathy associated with type 2 diabetes mellitus ([place]) 07/30/2017  . Onychomycosis of toenail 07/30/2017  . Idiopathic chronic venous hypertension of both lower extremities with inflammation 07/30/2017  . Chronic respiratory failure with hypoxia ([place]) 04/29/2017  . Advance care planning   . Goals of care, counseling/discussion   . Palliative care by specialist   . Pressure ulcer 01/19/2017  . History of pressure ulcer 10/17/2016  . GAD (generalized anxiety disorder) 10/17/2016  . Osteopenia determined by x-ray 04/17/2016  . Type 2 diabetes mellitus with diabetic neuropathy ([place]) 06/08/2015  . Type 2 diabetes, controlled, with renal  manifestation ([place]) 06/08/2015  . PVD (peripheral vascular disease) ([place]) 06/08/2015  . Swelling of limb-Left arm 04/10/2015  . Nausea with vomiting 01/12/2015  . Normocytic anemia 01/12/2015  . Paroxysmal atrial fibrillation ([place]) 12/22/2014  . Melena   . ESRD on dialysis ([place]) 12/19/2014  . Chronic diastolic (congestive) heart failure ([place]) 12/19/2014  . Palpitation 12/19/2014  . Anemia, chronic disease 12/13/2014  . Pulmonary edema 11/10/2014  . Physical deconditioning 10/29/2014  . Gastritis and gastroduodenitis 10/24/2014  . Heme positive stool 10/24/2014  . Morbid obesity ([place]) 10/18/2014  . Edema 10/18/2014  . Essential hypertension 10/17/2014  . DM type 2, uncontrolled, with renal complications ([place]) 81/27/5170  . Dyslipidemia 10/17/2014    Orientation RESPIRATION BLADDER Height & Weight     Self, Time, Situation, Place  O2 (2 Liters Oxygen) Continent Weight: 113 lb 1.5 oz (51.3 kg) Height:  5\' 5"  (165.1 cm)  BEHAVIORAL SYMPTOMS/MOOD NEUROLOGICAL BOWEL NUTRITION STATUS      Incontinent Diet (Soft diet)  AMBULATORY STATUS COMMUNICATION OF NEEDS Skin   Total Care (Patient was unable to ambulate with PT) Verbally Other (Comment) (Ecchymosis right/left arm)                       Personal Care Assistance Level of Assistance  Bathing, Feeding, Dressing Bathing Assistance: Limited assistance Feeding assistance: Independent Dressing Assistance: Limited assistance     Functional Limitations Info  Sight, Hearing, Speech Sight Info: Adequate Hearing Info: Adequate Speech Info: Adequate    SPECIAL CARE FACTORS FREQUENCY  PT (By licensed PT), OT (By licensed OT)     PT Frequency: Evaluated by PT on 11/10 OT Frequency: Evaluated by  OT on 11/11            Contractures Contractures Info: Not present    Additional Factors Info  Code Status, Allergies Code Status Info: Full Allergies Info: Erythromycin, Codeine, Erythromycin Base, Penicillins, Zantac,  Ranitidine Hcl           Current Medications (11/02/2020):  This is the current hospital active medication list Current Facility-Administered Medications  Medication Dose Route Frequency Provider Last Rate Last Admin  . acetaminophen (TYLENOL) tablet 650 mg  650 mg Oral Q6H PRN Regalado, Belkys A, MD      . allopurinol (ZYLOPRIM) tablet 100 mg  100 mg Oral Daily Regalado, Belkys A, MD   100 mg at 11/02/20 0905  . amiodarone (PACERONE) tablet 200 mg  200 mg Oral Daily Regalado, Belkys A, MD   200 mg at 11/02/20 0905  . ascorbic acid (VITAMIN C) tablet 500 mg  500 mg Oral Daily Regalado, Belkys A, MD   500 mg at 11/02/20 0907  . aspirin chewable tablet 81 mg  81 mg Oral Daily Regalado, Belkys A, MD   81 mg at 11/02/20 0906  . bisacodyl (DULCOLAX) suppository 10 mg  10 mg Rectal Daily PRN Samuella Cota, MD      . brimonidine (ALPHAGAN) 0.2 % ophthalmic solution 1 drop  1 drop Both Eyes BID Regalado, Belkys A, MD   1 drop at 11/01/20 2332  . brinzolamide (AZOPT) 1 % ophthalmic suspension 1 drop  1 drop Both Eyes BID Regalado, Belkys A, MD   1 drop at 11/02/20 0913  . calcium carbonate (TUMS - dosed in mg elemental calcium) chewable tablet 200 mg of elemental calcium  1 tablet Oral BID Regalado, Belkys A, MD   200 mg of elemental calcium at 11/02/20 0906  . Chlorhexidine Gluconate Cloth 2 % PADS 6 each  6 each Topical Q0600 Justin Mend, MD      . collagenase (SANTYL) ointment 1 application  1 application Topical Daily [place] Eric J, DO   1 application at 23/55/73 1019  . diazepam (VALIUM) tablet 5 mg  5 mg Oral QHS Regalado, Belkys A, MD   5 mg at 11/01/20 2335  . diclofenac Sodium (VOLTAREN) 1 % topical gel 2 g  2 g Topical QHS Regalado, Belkys A, MD   2 g at 11/01/20 2338  . doxercalciferol (HECTOROL) injection 1 mcg  1 mcg Intravenous Q M,W,F-HD Justin Mend, MD   1 mcg at 11/01/20 1115  . fluticasone (FLONASE) 50 MCG/ACT nasal spray 2 spray  2 spray Each Nare Q12H Regalado,  Belkys A, MD   2 spray at 11/02/20 0915  . hydrALAZINE (APRESOLINE) tablet 25 mg  25 mg Oral Q8H PRN Regalado, Belkys A, MD      . ipratropium (ATROVENT) 0.06 % nasal spray 2 spray  2 spray Each Nare BID Regalado, Belkys A, MD   2 spray at 11/02/20 1213  . loratadine (CLARITIN) tablet 10 mg  10 mg Oral Daily Regalado, Belkys A, MD   10 mg at 11/02/20 0906  . melatonin tablet 10 mg  10 mg Oral QHS Regalado, Belkys A, MD   10 mg at 11/01/20 2335  . midodrine (PROAMATINE) tablet 10 mg  10 mg Oral Q M,W,F-HD Joselyn Glassman A, RPH   10 mg at 11/01/20 0715  . midodrine (PROAMATINE) tablet 2.5 mg  2.5 mg Oral TID WC Regalado, Belkys A, MD   2.5 mg at 11/01/20 1626  . milk and molasses  enema  1 enema Rectal Once Regalado, Belkys A, MD      . multivitamin (RENA-VIT) tablet 1 tablet  1 tablet Oral QHS Regalado, Belkys A, MD   1 tablet at 11/01/20 2335  . Muscle Rub CREA   Topical TID PRN Regalado, Belkys A, MD      . ondansetron (ZOFRAN) tablet 4 mg  4 mg Oral Q6H PRN Regalado, Belkys A, MD       Or  . ondansetron (ZOFRAN) injection 4 mg  4 mg Intravenous Q6H PRN Regalado, Belkys A, MD   4 mg at 11/01/20 2156  . pantoprazole (PROTONIX) EC tablet 40 mg  40 mg Oral BID Samuella Cota, MD   40 mg at 11/02/20 0902  . polyethylene glycol (MIRALAX / GLYCOLAX) packet 17 g  17 g Oral BID Regalado, Belkys A, MD   17 g at 11/01/20 1203  . senna (SENOKOT) tablet 8.6 mg  1 tablet Oral BID Regalado, Belkys A, MD   8.6 mg at 11/02/20 0905  . traMADol (ULTRAM) tablet 50-100 mg  50-100 mg Oral Q6H PRN Joselyn Glassman A, RPH   100 mg at 11/02/20 1416  . traZODone (DESYREL) tablet 50 mg  50 mg Oral QHS Regalado, Belkys A, MD   50 mg at 11/01/20 2335     Discharge Medications: Please see discharge summary for a list of discharge medications.  Relevant Imaging Results:  Relevant Lab Results:   Additional Information [id number]. Negative COVID test  Sable Feil, LCSW

## 2020-11-02 NOTE — Plan of Care (Signed)
  Problem: Nutrition: Goal: Adequate nutrition will be maintained Outcome: Progressing   

## 2020-11-02 NOTE — Progress Notes (Signed)
[facility] Progress Note   Subjective:   Patient seen and examined at bedside.  Reports cramping abdominal pain with diarrhea last night and again today.  Says she has diarrhea every time she eats.  Afraid if she is d/c today she will continue to have diarrhea and not be able to go to dialysis.  Denies n/v, CP, SOB and palpitations.  Did not sleep well last night.   Objective Vitals:   11/01/20 1200 11/01/20 1715 11/01/20 2125 11/02/20 0537  BP: 126/78 138/61 (!) 152/68 (!) 113/52  Pulse: 80 74 73 68  Resp: 20 19 16 15   Temp: 98.3 F (36.8 C) 98.1 F (36.7 C) 98 F (36.7 C) 98.3 F (36.8 C)  TempSrc: Oral     SpO2: 100% 100% 100% 100%  Weight:   51.3 kg   Height:       Physical Exam General:frail, alert, chronically ill appearing elderly female in NAD Heart:RRR Lungs:CTAB Abdomen:soft, +BS, NTND Extremities:trace LE edema Dialysis Access: RU AVF +B   Filed Weights   11/01/20 0655 11/01/20 1111 11/01/20 2125  Weight: 54.6 kg 51.3 kg 51.3 kg    Intake/Output Summary (Last 24 hours) at 11/02/2020 1026 Last data filed at 11/02/2020 0537 Gross per 24 hour  Intake 440 ml  Output 300 ml  Net 140 ml    Additional Objective Labs: Basic Metabolic Panel: Recent Labs  Lab 10/30/20 0244 10/31/20 0455 11/01/20 0124  NA 141 137 137  K 5.7* 4.1 4.0  CL 97* 100 102  CO2 23 23 21*  GLUCOSE 131* 113* 123*  BUN 71* 42* 50*  CREATININE 6.33* 4.38* 4.93*  CALCIUM 8.6* 7.9* 7.7*  PHOS  --  4.9*  --    Liver Function Tests: Recent Labs  Lab 10/29/20 1348 10/31/20 0455  AST 21  --   ALT 19  --   ALKPHOS 88  --   BILITOT 1.2  --   PROT 6.4*  --   ALBUMIN 2.4* 2.1*   Recent Labs  Lab 10/29/20 1348  LIPASE 26   CBC: Recent Labs  Lab 10/29/20 1348 10/29/20 1348 10/30/20 0244 10/30/20 0244 10/31/20 0455 11/01/20 0124 11/02/20 0118  WBC 15.7*   < > 20.3*   < > 10.2 9.9 9.6  NEUTROABS 14.1*  --   --   --   --   --   --   HGB 12.1   < > 12.6    < > 10.5* 9.7* 8.6*  HCT 38.1   < > 39.6   < > 32.9* 29.5* 27.7*  MCV 96.5  --  96.6  --  96.8 96.4 97.9  PLT 246   < > 296   < > 246 259 203   < > = values in this interval not displayed.   Blood Culture    Component Value Date/Time   SDES BLOOD LEFT ARM 10/30/2020 1525   SPECREQUEST  10/30/2020 1525    BOTTLES DRAWN AEROBIC ONLY Blood Culture results may not be optimal due to an inadequate volume of blood received in culture bottles   CULT  10/30/2020 1525    NO GROWTH 3 DAYS Performed at [facility], [place] [address] [place], [place] [address]    REPTSTATUS PENDING 10/30/2020 1525   Medications:  . allopurinol  100 mg Oral Daily  . amiodarone  200 mg Oral Daily  . ascorbic acid  500 mg Oral Daily  . aspirin  81 mg Oral Daily  .  brimonidine  1 drop Both Eyes BID  . brinzolamide  1 drop Both Eyes BID  . calcium carbonate  1 tablet Oral BID  . Chlorhexidine Gluconate Cloth  6 each Topical Q0600  . collagenase  1 application Topical Daily  . diazepam  5 mg Oral QHS  . diclofenac Sodium  2 g Topical QHS  . doxercalciferol  1 mcg Intravenous Q M,W,F-HD  . fluticasone  2 spray Each Nare Q12H  . ipratropium  2 spray Each Nare BID  . loratadine  10 mg Oral Daily  . melatonin  10 mg Oral QHS  . midodrine  10 mg Oral Q M,W,F-HD  . midodrine  2.5 mg Oral TID WC  . milk and molasses  1 enema Rectal Once  . multivitamin  1 tablet Oral QHS  . pantoprazole  40 mg Oral BID  . polyethylene glycol  17 g Oral BID  . senna  1 tablet Oral BID  . traZODone  50 mg Oral QHS    [facility] Time: 3 hours 45 min, 3K, 2.25Ca, BFR 400, DFR 800, EDW 51.5kg, RUE AVG,no heparin, no ESA for Hb > 12 Hectorol 1 mcg IV q HD  Assessment/Plan: 1. N/v/constipation/diarrhea - Now with diarrhea again. bowel regimen per primary.  Followed by outpatient GI - son to arrange outpt follow up.   2. Leukocytosis: remains afebrile.  Improved to 9.6. BCx ngtd, imaging  w/o concerning features.  Not on abx. 3. Hyperkalemia - K 4.0, resolved with HD.  4. ESRD - on HD MWF. Off schedule on 11/9 and again on 11/11.  Plan for next HD on Monday per regular schedule.  Can be completed at OP unit.  5. Anemia of CKD- Hgb trending down.  12.6>10.5>9.7>8.6.  hemoccult + in ED. No current indication for transfusion. Will check iron studies and likely start ESA as OP.  6. Secondary hyperparathyroidism - CCa and phos in goal.  Continue VDRA. 7. HTN/volume - on chronic midodrine with HD. BP close to goal. Does not appear volume overload on exam.  8. Nutrition - Renal diet w/fluid restrictions. Protein supplements.  9. A fib - amiodarone, digoxin 10. Ulcer on buttock - per wound care 11. Dispo - ok for d/c from renal perspective.    Jen Mow, PA-C [facility] 11/02/2020,10:26 AM  LOS: 3 days

## 2020-11-02 NOTE — TOC Transition Note (Signed)
Transition of Care [facility]) - CM/SW Discharge Note *11/02/20 - Discharged back to [facility] [place] phone number: [phone]    Patient Details  Name: [name] James MRN: [id number] Date of Birth: Jun 16, 1936  Transition of Care [facility]) CM/SW Contact:  Sable Feil, LCSW Phone Number: 11/02/2020, 3:40 PM   Clinical Narrative:  Patient medically stable for discharge and is returning to [facility], where she is a LTC resident. Facility admissions director advised of patient's readiness for discharge and d/c clinicals transmitted to [place]. Daughter Coralyn Mark [phone]) contacted and informed regarding discharge.    Final next level of care: Skilled Nursing Facility Barriers to Discharge: Barriers Resolved   Patient Goals and CMS Choice Patient states their goals for this hospitalization and ongoing recovery are:: Patient agreeable to returning to [facility] where she lives CMS Medicare.gov Compare Post Acute Care list provided to:: Other (Comment Required) (Not needed as patient from [facility] and is returning there) Choice offered to / list presented to : NA  Discharge Placement   Existing PASRR number confirmed : 11/02/20          Patient [facility] bed at: [facility] Patient to be transferred to facility by: Non-emergency ambulance transport Name of family member notified: Daughter Coralyn Mark - [phone] Patient and family notified of of transfer: 11/02/20  Discharge Plan and Services In-house Referral: Clinical Social Work                                  Social Determinants of Health ([place]) Interventions  No SDOH interventions requested or needed at discharge   Readmission Risk Interventions No flowsheet data found.

## 2020-11-02 NOTE — Consult Note (Signed)
WOC Nurse Consult Note: Patient receiving care in [facility] [phone].  Assisted with turning, viewing wounds by primary RN, Wyvonnia Lora. Reason for Consult: Left flank wound Wound type: Left lateral lower back, above the buttocks, has an unstageable wound that measures 5 cm x 4 cm of unknown origin.  The wound bed is yellow and brown slough.  For this wound:  Apply Santyl to the left lower lateral back wound in a nickel thick layer. Cover with a saline moistened gauze, then foam dressing.  Change daily. Pressure Injury POA: Yes  There is fungal MASD to the perineal area and perianal site.  For these areas:  Every shift, wash perineal and perianal areas. Pat dry. Sprinkle antifungal powder (green and white bottle in clean utility) over the areas.  On the right and left buttocks and across the coccyx regions, there is a patch of hypopigmented skin and clear evidence there has been an extensive wound, which has almost completely healed except for one very small partial thickness open area on each buttock. The origin of these sites is not clear at this time.  The patient arrived with them. The areas are currently covered with a sacral foam dressing.  For these areas:  For the two very small open areas on R and L buttocks, apply a small amount of Criticaid clear (purple and white tube in clean utility) and cover with a foam dressing; perform daily.  There is a healed abrasion of the right lateral upper back the patient states occurred during a pre-hospital fall.  There are pieces of what appears to be calcium alginate and a foam dressing over this abraded area.  Both dressings are completely clean without any drainage.  There is no need to apply a new dressing to this site.  Monitor the wound area(s) for worsening of condition such as: Signs/symptoms of infection,  Increase in size,  Development of or worsening of odor, Development of pain, or increased pain at the affected locations.  Notify the medical team if any of  these develop.  Thank you for the consult.  Discussed plan of care with the patient and bedside nurse.  [place] nurse will not follow at this time.  Please re-consult the [place] team if needed.  Val Riles, RN, MSN, CWOCN, CNS-BC, pager [phone]

## 2020-11-02 NOTE — Progress Notes (Signed)
Report was given to RN at [facility].

## 2020-11-02 NOTE — Progress Notes (Signed)
[place] to give report to RN and both times it went to voicemail. Left a message for them to call me. Will try again in the next 5 minutes.

## 2020-11-02 NOTE — TOC Initial Note (Signed)
Transition of Care [facility]) - Initial/Assessment Note    Patient Details  Name: NOVIS LEAGUE MRN: [id number] Date of Birth: 1936/07/13  Transition of Care [facility]) CM/SW Contact:    Sable Feil, LCSW Phone Number: 11/02/2020, 2:33 PM  Clinical Narrative:  Talked with patient at the bedside regarding her discharge disposition. Patient was sitting up in bed and was alert, oriented and willing to talk with CSW. Ms. Razavi reported that she is from [facility] and lives there for going on 6 years. She added that she plans to return to [place]. Patient reported that she has had the COVID vaccinations - 2 vaccinations and the booster, and has also had the flu shot. CSW was given permission to contact her daughter Kenney Houseman.               Expected Discharge Plan: Skilled Nursing Facility Barriers to Discharge: Barriers Resolved   Patient Goals and CMS Choice Patient states their goals for this hospitalization and ongoing recovery are:: Patient agreeable to returning to [facility] where she lives CMS Medicare.gov Compare Post Acute Care list provided to:: Other (Comment Required) (Not needed as patient returning to [facility] where she is a LTC resident) Choice offered to / list presented to : NA  Expected Discharge Plan and Services Expected Discharge Plan: [place] In-house Referral: Clinical Social Work     Living arrangements for the past 2 months: [place] ([place] [facility]) Expected Discharge Date: 11/02/20                                   Prior Living Arrangements/Services Living arrangements for the past 2 months: [place] ([place] [facility]) Lives with:: Facility Resident Patient language and need for interpreter reviewed:: No Do you feel safe going back to the place where you live?: Yes      Need for Family Participation in Patient Care: Yes (Comment) Care giver support system in place?: Yes (comment)   Criminal  Activity/Legal Involvement Pertinent to Current Situation/Hospitalization: No - Comment as needed  Activities of Daily Living Home Assistive Devices/Equipment: Wheelchair ADL Screening (condition at time of admission) Patient's cognitive ability adequate to safely complete daily activities?: No Is the patient deaf or have difficulty hearing?: Yes Does the patient have difficulty seeing, even when wearing glasses/contacts?: No Does the patient have difficulty concentrating, remembering, or making decisions?: No Patient able to express need for assistance with ADLs?: Yes Does the patient have difficulty dressing or bathing?: No Independently performs ADLs?: Yes (appropriate for developmental age) Does the patient have difficulty walking or climbing stairs?: Yes Weakness of Legs: Both Weakness of Arms/Hands: None  Permission Sought/Granted Permission sought to share information with : Facility Art therapist granted to share information with : Yes, Verbal Permission Granted  Share Information with NAME: Coralyn Mark     Permission granted to share info w Relationship: Daughter  Permission granted to share info w Contact Information: [phone]  Emotional Assessment Appearance:: Appears stated age Attitude/Demeanor/Rapport: Engaged Affect (typically observed): Pleasant Orientation: : Oriented to Self, Oriented to Place, Oriented to  Time, Oriented to Situation Alcohol / Substance Use: Tobacco Use, Alcohol Use, Illicit Drugs (Per H&P, patient has never smoked and does not drink or use illicit drugs) Psych Involvement: No (comment)  Admission diagnosis:  Leukocytosis [D72.829] Nausea & vomiting [R11.2] Constipation, unspecified constipation type [K59.00] Vomiting, intractability of vomiting not specified, presence of nausea  not specified, unspecified vomiting type [R11.10] Intractable vomiting [R11.10] Patient Active Problem List   Diagnosis Date Noted  . Atrial  fibrillation with RVR ([place]) 08/03/2020  . Nausea and vomiting 03/14/2019  . Chronic diarrhea 03/14/2019  . Dysuria 03/14/2019  . UTI (urinary tract infection) 03/14/2019  . Aortic atherosclerosis ([place]) 12/30/2018  . Hypoalbuminemia due to protein-calorie malnutrition ([place]) 12/30/2018  . Tachycardia-bradycardia syndrome ([place])   . Symptomatic bradycardia 08/24/2018  . Gout 08/23/2018  . Anxiety 08/23/2018  . Pneumothorax, left 10/13/2017  . Multiple lung nodules on CT 10/06/2017  . Pleural effusion, left 10/06/2017  . Diabetic polyneuropathy associated with type 2 diabetes mellitus ([place]) 07/30/2017  . Onychomycosis of toenail 07/30/2017  . Idiopathic chronic venous hypertension of both lower extremities with inflammation 07/30/2017  . Chronic respiratory failure with hypoxia ([place]) 04/29/2017  . Advance care planning   . Goals of care, counseling/discussion   . Palliative care by specialist   . Pressure ulcer 01/19/2017  . History of pressure ulcer 10/17/2016  . GAD (generalized anxiety disorder) 10/17/2016  . Osteopenia determined by x-ray 04/17/2016  . Type 2 diabetes mellitus with diabetic neuropathy ([place]) 06/08/2015  . Type 2 diabetes, controlled, with renal manifestation ([place]) 06/08/2015  . PVD (peripheral vascular disease) ([place]) 06/08/2015  . Swelling of limb-Left arm 04/10/2015  . Nausea with vomiting 01/12/2015  . Normocytic anemia 01/12/2015  . Paroxysmal atrial fibrillation ([place]) 12/22/2014  . Melena   . ESRD on dialysis ([place]) 12/19/2014  . Chronic diastolic (congestive) heart failure ([place]) 12/19/2014  . Palpitation 12/19/2014  . Anemia, chronic disease 12/13/2014  . Pulmonary edema 11/10/2014  . Physical deconditioning 10/29/2014  . Gastritis and gastroduodenitis 10/24/2014  . Heme positive stool 10/24/2014  . Morbid obesity ([place]) 10/18/2014  . Edema 10/18/2014  . Essential hypertension 10/17/2014  . DM type 2, uncontrolled, with renal complications ([place])  62/44/6950  . Dyslipidemia 10/17/2014   PCP:  [facility]:   [place], [place] - [address] [place] [address] Phone: [phone] Fax: [phone]     Social Determinants of Health (SDOH) Interventions  No SDOH interventions requested or needed at discharge  Readmission Risk Interventions No flowsheet data found.

## 2020-11-02 NOTE — Discharge Summary (Signed)
Physician Discharge Summary  [name] James:[id number] DOB: 10-Feb-1936 DOA: 10/29/2020  PCP: Patrick Jupiter  Admit date: 10/29/2020 Discharge date: 11/02/2020  Admitted From: [facility] Disposition:  [facility]  Recommendations for Outpatient Follow-up:  1. Follow up with PCP in 1-2 weeks 2. Follow-up with gastroenterology, Dr. Hilarie Fredrickson 1-2 weeks 3. Discontinued cholestyramine secondary to constipation 4. Please obtain BMP/CBC in one week 5. Please follow up on the following pending results:   Discharge Condition: Stable CODE STATUS: Full Code Diet recommendation: Renal Diet  History of present illness:  [name] James is a 84 year old woman from SNF PMH including ESRD presented with worsening abdominal pain, nausea, vomiting, constipation refractory to medication.  CT scan showed significant stool burden.  Not impacted. Admitted for nausea, vomiting, abdominal pain secondary to constipation.   Hospital course:  Nausea, vomiting, abdominal pain secondary to constipation.   Patient presenting from SNF with nausea vomiting with associated abdominal pain.  CT abdomen/pelvis with large amount of stool in the colon/rectum, without evidence of stercoral colitis.  Home cholestyramine was discontinued and started on bowel regimen with scheduled MiraLAX and Senokot with improvement of symptoms.  Will discontinue cholestyramine on discharge.  Recommend intermittent use of Imodium/Lomotil for diarrhea versus as needed MiraLAX or suppositories for constipation.  Recommend close outpatient follow-up with her primary gastroenterologist, Dr. Hilarie Fredrickson.  Leukocytosis: resolved Urinalysis was abnormal but urine culture is unrevealing. Suspect acute reaction to vomiting, dehydration.   PAF continue amiodarone  ESRD, with hyperkalemia on admission Missed HD 11/8, Hyperkalemia resolved with Lokelma. Nephrology followed while inpatient. Next HD 11/15.  GERD Continue PPI  Anemia of  ESRD Stable, hemoglobin 8.6 at time of discharge.  Chronic diastolic CHF Stable, continue volume control with HD  Chronic hypotension Stable. Continue midodrine  Chronic resp failure on 2 L nasal cannula stable, continue supplemental oxygen  Aortic atherosclerosis Outpatient follow-up  Discharge Diagnoses:  Active Problems:   Essential hypertension   DM type 2, uncontrolled, with renal complications (HCC)   Dyslipidemia   ESRD on dialysis ([place])   Chronic diastolic (congestive) heart failure (HCC)   Paroxysmal atrial fibrillation (HCC)   Chronic respiratory failure with hypoxia [facility])    Discharge Instructions  Discharge Instructions    Diet - low sodium heart healthy   Complete by: As directed    Increase activity slowly   Complete by: As directed    No wound care   Complete by: As directed      Allergies as of 11/02/2020      Reactions   Erythromycin Swelling   Codeine Palpitations, Rash, Other (See Comments)   GI Upset   Erythromycin Base Other (See Comments)   GI Upset   Penicillins Rash, Other (See Comments)   Has taken Keflex & Rocephin many times without problems Documented on MAR   Zantac [ranitidine Hcl] Nausea And Vomiting      Medication List    STOP taking these medications   cholestyramine 4 g packet Commonly known as: Questran     TAKE these medications   acetaminophen 325 MG tablet Commonly known as: TYLENOL Take 650 mg by mouth every 6 (six) hours as needed for fever (greater than 100 F).   allopurinol 100 MG tablet Commonly known as: ZYLOPRIM Take 100 mg by mouth daily.   amiodarone 200 MG tablet Commonly known as: Pacerone Take 2 tablets (400mg ) daily for 7 days. Thereafter take 1 tablet (200mg ) daily. What changed:   how much to take  how to take this  when to take this  additional instructions   aspirin 81 MG chewable tablet Chew 81 mg by mouth daily.   b complex-vitamin c-folic acid 0.8 MG Tabs tablet Take 1  tablet by mouth daily.   Biofreeze 4 % Gel Generic drug: Menthol (Topical Analgesic) Apply 1 application topically 3 (three) times daily as needed (bilateral knee pain).   calcium carbonate 500 MG chewable tablet Commonly known as: TUMS - dosed in mg elemental calcium Chew 1 tablet by mouth 2 (two) times daily.   cetirizine 10 MG tablet Commonly known as: ZYRTEC Take 10 mg by mouth daily.   collagenase ointment Commonly known as: SANTYL Apply 1 application topically daily. Open ulcer to left posterior flank wound bed   diazepam 5 MG tablet Commonly known as: Valium Take 1 tablet (5 mg total) by mouth at bedtime.   diclofenac Sodium 1 % Gel Commonly known as: VOLTAREN Apply 2 g topically See admin instructions. Apply 2 grams to the right knee at bedtime for pain   diphenoxylate-atropine 2.5-0.025 MG tablet Commonly known as: LOMOTIL Take 1 tablet by mouth every 6 (six) hours as needed for diarrhea or loose stools. Take 1 tablet by mouth every 6 hours PRN for diarrhea or loose stools   fluticasone 50 MCG/ACT nasal spray Commonly known as: FLONASE Place 2 sprays into both nostrils every 12 (twelve) hours.   HM Vitamin C 500 MG tablet Generic drug: ascorbic acid Take 500 mg by mouth daily.   ipratropium 0.03 % nasal spray Commonly known as: ATROVENT Place 2 sprays into both nostrils every 12 (twelve) hours.   loperamide 2 MG tablet Commonly known as: IMODIUM A-D Take 2 tablets (4 mg total) by mouth every 8 (eight) hours as needed for diarrhea or loose stools (for diarrhea). What changed:   when to take this  additional instructions   Melatonin 5 MG Caps Take 10 mg by mouth at bedtime.   midodrine 2.5 MG tablet Commonly known as: PROAMATINE Take 2.5 mg by mouth 3 (three) times daily with meals. Give if BP<120/60   midodrine 10 MG tablet Commonly known as: PROAMATINE Take 10 mg by mouth See admin instructions. Take 1 tablet (10 mg) on MWF for BP Support At  Dialysis   OXYGEN Inhale 2 L into the lungs continuous.   pantoprazole 40 MG tablet Commonly known as: PROTONIX Take 1 tablet (40 mg total) by mouth 2 (two) times daily.   polyethylene glycol 17 g packet Commonly known as: MIRALAX / GLYCOLAX Take 17 g by mouth daily as needed for moderate constipation.   promethazine 25 MG tablet Commonly known as: PHENERGAN Take 25 mg by mouth 2 (two) times daily as needed for nausea.   SALINE NASAL MIST NA Place 2 sprays into the nose 3 (three) times daily.   Secura Dimethicone Protectant 5 % Crea Generic drug: DIMETHICONE (TOPICAL) Apply 1 application topically See admin instructions. Apply a nickel-sized amount onto the buttocks area 2 times a day for barrier protection   Simbrinza 1-0.2 % Susp Generic drug: Brinzolamide-Brimonidine Place 1 drop into both eyes 2 (two) times daily.   sodium phosphate 7-19 GM/118ML Enem Place 1 enema rectally daily as needed for severe constipation.   traMADol 50 MG tablet Commonly known as: ULTRAM Take 1-2 tablets (50-100 mg total) by mouth See admin instructions. Take 1 tablet (50 mg totally) by mouth every 6 hours as needed for mild - moderate pain; take 2 tablets (100mg  totally) by  mouth as needed for moderate - severe pain   traZODone 50 MG tablet Commonly known as: DESYREL Take 1 tablet (50 mg total) by mouth at bedtime.       Follow-up Information    [facility]. Schedule an appointment as soon as possible for a visit in 1 week(s).   Specialty: Skilled Nursing Facility Contact information: [place] [address] [phone]        Jerene Bears, MD. Schedule an appointment as soon as possible for a visit in 1 week(s).   Specialty: Gastroenterology Contact information: [address] [address] [phone]              Allergies  Allergen Reactions  . Erythromycin Swelling  . Codeine Palpitations, Rash and Other (See Comments)    GI Upset  .  Erythromycin Base Other (See Comments)    GI Upset  . Penicillins Rash and Other (See Comments)    Has taken Keflex & Rocephin many times without problems Documented on MAR  . Zantac [Ranitidine Hcl] Nausea And Vomiting    Consultations:  Nephrology   Procedures/Studies: CT ABDOMEN PELVIS WO CONTRAST  Result Date: 10/29/2020 CLINICAL DATA:  Generalized abdominal pain EXAM: CT ABDOMEN AND PELVIS WITHOUT CONTRAST TECHNIQUE: Multidetector CT imaging of the abdomen and pelvis was performed following the standard protocol without IV contrast. COMPARISON:  01/22/2016 FINDINGS: Lower chest: There are small chronic bilateral pleural effusions and left lower lobe atelectasis. Extensive calcification of the mitral annulus. Hepatobiliary: No focal liver abnormality is seen. Status post cholecystectomy. No biliary dilatation. Pancreas: Diffuse pancreatic atrophy. Spleen: Normal in size without focal abnormality. Adrenals/Urinary Tract: Severe bilateral renal cortical atrophy. Bilobed complex hypodensity upper pole left kidney measures 2.3 x 1.8 cm and 1.6 x 1.5 cm. Hounsfield attenuation measures between 47 and 63 Hounsfield units, with thin peripheral calcification along the inferior aspect. A simple cyst was seen within this region previous, and this likely reflects a hemorrhagic cyst. There is a nonobstructing 5 mm calculus within the left renal pelvis. No right-sided calculi. No obstructive uropathy. Stable low-attenuation nodule within the right adrenal gland consistent with adenoma. The left adrenal is normal. Stomach/Bowel: There is a large amount of stool within the distal colon and rectum. No bowel obstruction or ileus. No bowel wall thickening or inflammatory change. Vascular/Lymphatic: Aortic atherosclerosis. No enlarged abdominal or pelvic lymph nodes. Reproductive: Status post hysterectomy. No adnexal masses. Other: Patient is markedly cachectic. Dependent body wall edema is seen within the lower  abdomen and pelvis. No free intraperitoneal fluid or free gas. No abdominal wall hernia. Musculoskeletal: No acute displaced fracture. Bones are diffusely osteopenic. Reconstructed images demonstrate no additional findings. IMPRESSION: 1. Large amount of stool within the distal colon and rectum. 2. Chronic bilateral pleural effusions and left lower lobe atelectasis. 3. Bilobed complex hypodensity upper pole left kidney, likely hemorrhagic cyst. 4. Nonobstructing 5 mm left renal calculus. 5. Aortic Atherosclerosis (ICD10-I70.0). Electronically Signed   By: Randa Ngo M.D.   On: 10/29/2020 15:50   DG Chest 2 View  Result Date: 10/29/2020 CLINICAL DATA:  Abdominal pain and vomiting EXAM: CHEST - 2 VIEW COMPARISON:  September 14, 2020 FINDINGS: The heart size and mediastinal contours are within normal limits. Aortic knob calcifications are seen. There is elevation of the left hemidiaphragm. Small bilateral effusions seen, left greater than right. There is diffuse osteopenia. IMPRESSION: Small bilateral pleural effusions, left greater than right. Electronically Signed   By: Ebony Cargo.D.  On: 10/29/2020 18:17      Subjective: Patient seen and examined at bedside, resting comfortably.  Tolerating diet.  4 bowel movements yesterday.  Discharging back to SNF today.  Denies headache, no dizziness, no chest pain, palpitations, no shortness of breath, no abdominal pain.  No acute events overnight per nursing staff.  Discharge Exam: Vitals:   11/01/20 2125 11/02/20 0537  BP: (!) 152/68 (!) 113/52  Pulse: 73 68  Resp: 16 15  Temp: 98 F (36.7 C) 98.3 F (36.8 C)  SpO2: 100% 100%   Vitals:   11/01/20 1200 11/01/20 1715 11/01/20 2125 11/02/20 0537  BP: 126/78 138/61 (!) 152/68 (!) 113/52  Pulse: 80 74 73 68  Resp: 20 19 16 15   Temp: 98.3 F (36.8 C) 98.1 F (36.7 C) 98 F (36.7 C) 98.3 F (36.8 C)  TempSrc: Oral     SpO2: 100% 100% 100% 100%  Weight:   51.3 kg   Height:         General: Pt is alert, awake, not in acute distress, thin in appearance Cardiovascular: RRR, S1/S2 +, no rubs, no gallops Respiratory: CTA bilaterally, no wheezing, no rhonchi, on 2 L nasal cannula which is her baseline Abdominal: Soft, NT, ND, bowel sounds + Extremities: no edema, no cyanosis    The results of significant diagnostics from this hospitalization (including imaging, microbiology, ancillary and laboratory) are listed below for reference.     Microbiology: Recent Results (from the past 240 hour(s))  Respiratory Panel by RT PCR (Flu A&B, Covid) - Nasopharyngeal Swab     Status: None   Collection Time: 10/29/20  4:02 PM   Specimen: Nasopharyngeal Swab  Result Value Ref Range Status   SARS Coronavirus 2 by RT PCR NEGATIVE NEGATIVE Final    Comment: (NOTE) SARS-CoV-2 target nucleic acids are NOT DETECTED.  The SARS-CoV-2 RNA is generally detectable in upper respiratoy specimens during the acute phase of infection. The lowest concentration of SARS-CoV-2 viral copies this assay can detect is 131 copies/mL. A negative result does not preclude SARS-Cov-2 infection and should not be used as the sole basis for treatment or other patient management decisions. A negative result may occur with  improper specimen collection/handling, submission of specimen other than nasopharyngeal swab, presence of viral mutation(s) within the areas targeted by this assay, and inadequate number of viral copies (<131 copies/mL). A negative result must be combined with clinical observations, patient history, and epidemiological information. The expected result is Negative.  Fact Sheet for Patients:  PinkCheek.be  Fact Sheet for Healthcare Providers:  GravelBags.it  This test is no t yet approved or cleared by the [place] FDA and  has been authorized for detection and/or diagnosis of SARS-CoV-2 by FDA under an Emergency Use  Authorization (EUA). This EUA will remain  in effect (meaning this test can be used) for the duration of the COVID-19 declaration under Section 564(b)(1) of the Act, 21 U.S.C. section 360bbb-3(b)(1), unless the authorization is terminated or revoked sooner.     Influenza A by PCR NEGATIVE NEGATIVE Final   Influenza B by PCR NEGATIVE NEGATIVE Final    Comment: (NOTE) The Xpert Xpress SARS-CoV-2/FLU/RSV assay is intended as an aid in  the diagnosis of influenza from Nasopharyngeal swab specimens and  should not be used as a sole basis for treatment. Nasal washings and  aspirates are unacceptable for Xpert Xpress SARS-CoV-2/FLU/RSV  testing.  Fact Sheet for Patients: PinkCheek.be  Fact Sheet for Healthcare Providers: GravelBags.it  This test is  not yet approved or cleared by the [place] and  has been authorized for detection and/or diagnosis of SARS-CoV-2 by  FDA under an Emergency Use Authorization (EUA). This EUA will remain  in effect (meaning this test can be used) for the duration of the  Covid-19 declaration under Section 564(b)(1) of the Act, 21  U.S.C. section 360bbb-3(b)(1), unless the authorization is  terminated or revoked. Performed at [facility], [place] [address], [place], [place] [address]   Urine Culture     Status: Abnormal   Collection Time: 10/30/20  7:25 AM   Specimen: Urine, Random  Result Value Ref Range Status   Specimen Description URINE, RANDOM  Final   Special Requests   Final    NONE Performed at [facility], [place] [address] [place], [place] [address]    Culture MULTIPLE SPECIES PRESENT, SUGGEST RECOLLECTION (A)  Final   Report Status 10/31/2020 FINAL  Final  Culture, blood (routine x 2)     Status: None (Preliminary result)   Collection Time: 10/30/20  3:23 PM   Specimen: BLOOD LEFT HAND  Result Value Ref Range Status   Specimen Description BLOOD LEFT HAND  Final    Special Requests   Final    BOTTLES DRAWN AEROBIC ONLY Blood Culture results may not be optimal due to an inadequate volume of blood received in culture bottles   Culture   Final    NO GROWTH 3 DAYS Performed at [facility], [place] [address], [place], [place] [address]    Report Status PENDING  Incomplete  Culture, blood (routine x 2)     Status: None (Preliminary result)   Collection Time: 10/30/20  3:25 PM   Specimen: BLOOD LEFT ARM  Result Value Ref Range Status   Specimen Description BLOOD LEFT ARM  Final   Special Requests   Final    BOTTLES DRAWN AEROBIC ONLY Blood Culture results may not be optimal due to an inadequate volume of blood received in culture bottles   Culture   Final    NO GROWTH 3 DAYS Performed at [facility], [place] [address], [place], [place] [address]    Report Status PENDING  Incomplete  SARS Coronavirus 2 by RT PCR (hospital order, performed in [facility] hospital lab) Nasopharyngeal Nasopharyngeal Swab     Status: None   Collection Time: 11/02/20 10:26 AM   Specimen: Nasopharyngeal Swab  Result Value Ref Range Status   SARS Coronavirus 2 NEGATIVE NEGATIVE Final    Comment: (NOTE) SARS-CoV-2 target nucleic acids are NOT DETECTED.  The SARS-CoV-2 RNA is generally detectable in upper and lower respiratory specimens during the acute phase of infection. The lowest concentration of SARS-CoV-2 viral copies this assay can detect is 250 copies / mL. A negative result does not preclude SARS-CoV-2 infection and should not be used as the sole basis for treatment or other patient management decisions.  A negative result may occur with improper specimen collection / handling, submission of specimen other than nasopharyngeal swab, presence of viral mutation(s) within the areas targeted by this assay, and inadequate number of viral copies (<250 copies / mL). A negative result must be combined with clinical observations, patient history, and epidemiological  information.  Fact Sheet for Patients:   StrictlyIdeas.no  Fact Sheet for Healthcare Providers: BankingDealers.co.za  This test is not yet approved or  cleared by the [place] FDA and has been authorized for detection and/or diagnosis of SARS-CoV-2 by  FDA under an Emergency Use Authorization (EUA).  This EUA will remain in effect (meaning this test can be used) for the duration of the COVID-19 declaration under Section 564(b)(1) of the Act, 21 U.S.C. section 360bbb-3(b)(1), unless the authorization is terminated or revoked sooner.  Performed at [facility], [place] [address], [place], [place] [address]      Labs: BNP (last 3 results) Recent Labs    08/01/20 1413  BNP 4,010.2*   Basic Metabolic Panel: Recent Labs  Lab 10/29/20 1348 10/29/20 1937 10/30/20 0244 10/31/20 0455 11/01/20 0124  NA 138  --  141 137 137  K 5.6*  --  5.7* 4.1 4.0  CL 97*  --  97* 100 102  CO2 22  --  23 23 21*  GLUCOSE 155*  --  131* 113* 123*  BUN 66*  --  71* 42* 50*  CREATININE 6.28* 6.21* 6.33* 4.38* 4.93*  CALCIUM 8.3*  --  8.6* 7.9* 7.7*  MG 2.2  --   --   --   --   PHOS  --   --   --  4.9*  --    Liver Function Tests: Recent Labs  Lab 10/29/20 1348 10/31/20 0455  AST 21  --   ALT 19  --   ALKPHOS 88  --   BILITOT 1.2  --   PROT 6.4*  --   ALBUMIN 2.4* 2.1*   Recent Labs  Lab 10/29/20 1348  LIPASE 26   No results for input(s): AMMONIA in the last 168 hours. CBC: Recent Labs  Lab 10/29/20 1348 10/30/20 0244 10/31/20 0455 11/01/20 0124 11/02/20 0118  WBC 15.7* 20.3* 10.2 9.9 9.6  NEUTROABS 14.1*  --   --   --   --   HGB 12.1 12.6 10.5* 9.7* 8.6*  HCT 38.1 39.6 32.9* 29.5* 27.7*  MCV 96.5 96.6 96.8 96.4 97.9  PLT 246 296 246 259 203   Cardiac Enzymes: No results for input(s): CKTOTAL, CKMB, CKMBINDEX, TROPONINI in the last 168 hours. BNP: Invalid input(s): POCBNP CBG: No results for input(s): GLUCAP in  the last 168 hours. D-Dimer No results for input(s): DDIMER in the last 72 hours. Hgb A1c No results for input(s): HGBA1C in the last 72 hours. Lipid Profile No results for input(s): CHOL, HDL, LDLCALC, TRIG, CHOLHDL, LDLDIRECT in the last 72 hours. Thyroid function studies No results for input(s): TSH, T4TOTAL, T3FREE, THYROIDAB in the last 72 hours.  Invalid input(s): FREET3 Anemia work up No results for input(s): VITAMINB12, FOLATE, FERRITIN, TIBC, IRON, RETICCTPCT in the last 72 hours. Urinalysis    Component Value Date/Time   COLORURINE AMBER (A) 10/30/2020 0830   APPEARANCEUR TURBID (A) 10/30/2020 0830   LABSPEC 1.010 10/30/2020 0830   PHURINE 8.0 10/30/2020 0830   GLUCOSEU NEGATIVE 10/30/2020 0830   HGBUR SMALL (A) 10/30/2020 0830   BILIRUBINUR NEGATIVE 10/30/2020 0830   BILIRUBINUR neg 04/02/2014 0848   KETONESUR NEGATIVE 10/30/2020 0830   PROTEINUR 100 (A) 10/30/2020 0830   UROBILINOGEN 0.2 10/26/2014 2043   NITRITE NEGATIVE 10/30/2020 0830   LEUKOCYTESUR MODERATE (A) 10/30/2020 0830   Sepsis Labs Invalid input(s): PROCALCITONIN,  WBC,  LACTICIDVEN Microbiology Recent Results (from the past 240 hour(s))  Respiratory Panel by RT PCR (Flu A&B, Covid) - Nasopharyngeal Swab     Status: None   Collection Time: 10/29/20  4:02 PM   Specimen: Nasopharyngeal Swab  Result Value Ref Range Status   SARS Coronavirus 2 by RT PCR NEGATIVE NEGATIVE  Final    Comment: (NOTE) SARS-CoV-2 target nucleic acids are NOT DETECTED.  The SARS-CoV-2 RNA is generally detectable in upper respiratoy specimens during the acute phase of infection. The lowest concentration of SARS-CoV-2 viral copies this assay can detect is 131 copies/mL. A negative result does not preclude SARS-Cov-2 infection and should not be used as the sole basis for treatment or other patient management decisions. A negative result may occur with  improper specimen collection/handling, submission of specimen other than  nasopharyngeal swab, presence of viral mutation(s) within the areas targeted by this assay, and inadequate number of viral copies (<131 copies/mL). A negative result must be combined with clinical observations, patient history, and epidemiological information. The expected result is Negative.  Fact Sheet for Patients:  PinkCheek.be  Fact Sheet for Healthcare Providers:  GravelBags.it  This test is no t yet approved or cleared by the [place] FDA and  has been authorized for detection and/or diagnosis of SARS-CoV-2 by FDA under an Emergency Use Authorization (EUA). This EUA will remain  in effect (meaning this test can be used) for the duration of the COVID-19 declaration under Section 564(b)(1) of the Act, 21 U.S.C. section 360bbb-3(b)(1), unless the authorization is terminated or revoked sooner.     Influenza A by PCR NEGATIVE NEGATIVE Final   Influenza B by PCR NEGATIVE NEGATIVE Final    Comment: (NOTE) The Xpert Xpress SARS-CoV-2/FLU/RSV assay is intended as an aid in  the diagnosis of influenza from Nasopharyngeal swab specimens and  should not be used as a sole basis for treatment. Nasal washings and  aspirates are unacceptable for Xpert Xpress SARS-CoV-2/FLU/RSV  testing.  Fact Sheet for Patients: PinkCheek.be  Fact Sheet for Healthcare Providers: GravelBags.it  This test is not yet approved or cleared by the [place] FDA and  has been authorized for detection and/or diagnosis of SARS-CoV-2 by  FDA under an Emergency Use Authorization (EUA). This EUA will remain  in effect (meaning this test can be used) for the duration of the  Covid-19 declaration under Section 564(b)(1) of the Act, 21  U.S.C. section 360bbb-3(b)(1), unless the authorization is  terminated or revoked. Performed at [facility], [place] [address], [place],  [place] [address]   Urine Culture     Status: Abnormal   Collection Time: 10/30/20  7:25 AM   Specimen: Urine, Random  Result Value Ref Range Status   Specimen Description URINE, RANDOM  Final   Special Requests   Final    NONE Performed at [facility], [place] [address] [place], [place] [address]    Culture MULTIPLE SPECIES PRESENT, SUGGEST RECOLLECTION (A)  Final   Report Status 10/31/2020 FINAL  Final  Culture, blood (routine x 2)     Status: None (Preliminary result)   Collection Time: 10/30/20  3:23 PM   Specimen: BLOOD LEFT HAND  Result Value Ref Range Status   Specimen Description BLOOD LEFT HAND  Final   Special Requests   Final    BOTTLES DRAWN AEROBIC ONLY Blood Culture results may not be optimal due to an inadequate volume of blood received in culture bottles   Culture   Final    NO GROWTH 3 DAYS Performed at [facility], [place] [address], [place], [place] [address]    Report Status PENDING  Incomplete  Culture, blood (routine x 2)     Status: None (Preliminary result)   Collection Time: 10/30/20  3:25 PM   Specimen: BLOOD LEFT ARM  Result Value Ref Range Status   Specimen Description BLOOD LEFT ARM  Final   Special Requests   Final    BOTTLES DRAWN AEROBIC ONLY Blood Culture results may not be optimal due to an inadequate volume of blood received in culture bottles   Culture   Final    NO GROWTH 3 DAYS Performed at [facility], [place] [address] [place], [place] [address]    Report Status PENDING  Incomplete  SARS Coronavirus 2 by RT PCR (hospital order, performed in [facility] hospital lab) Nasopharyngeal Nasopharyngeal Swab     Status: None   Collection Time: 11/02/20 10:26 AM   Specimen: Nasopharyngeal Swab  Result Value Ref Range Status   SARS Coronavirus 2 NEGATIVE NEGATIVE Final    Comment: (NOTE) SARS-CoV-2 target nucleic acids are NOT DETECTED.  The SARS-CoV-2 RNA is generally detectable in upper and lower respiratory specimens during the  acute phase of infection. The lowest concentration of SARS-CoV-2 viral copies this assay can detect is 250 copies / mL. A negative result does not preclude SARS-CoV-2 infection and should not be used as the sole basis for treatment or other patient management decisions.  A negative result may occur with improper specimen collection / handling, submission of specimen other than nasopharyngeal swab, presence of viral mutation(s) within the areas targeted by this assay, and inadequate number of viral copies (<250 copies / mL). A negative result must be combined with clinical observations, patient history, and epidemiological information.  Fact Sheet for Patients:   StrictlyIdeas.no  Fact Sheet for Healthcare Providers: BankingDealers.co.za  This test is not yet approved or  cleared by the [place] FDA and has been authorized for detection and/or diagnosis of SARS-CoV-2 by FDA under an Emergency Use Authorization (EUA).  This EUA will remain in effect (meaning this test can be used) for the duration of the COVID-19 declaration under Section 564(b)(1) of the Act, 21 U.S.C. section 360bbb-3(b)(1), unless the authorization is terminated or revoked sooner.  Performed at [facility], [place] [address] [place], [place] [address]      Time coordinating discharge: Over 30 minutes  SIGNED:   Gwenyth Dingee J [place] DO  [facility] 11/02/2020, 1:25 PM

## 2020-11-04 LAB — CULTURE, BLOOD (ROUTINE X 2)
Culture: NO GROWTH
Culture: NO GROWTH

## 2020-11-29 ENCOUNTER — Ambulatory Visit (INDEPENDENT_AMBULATORY_CARE_PROVIDER_SITE_OTHER): Payer: Medicare Other | Admitting: Cardiovascular Disease

## 2020-11-29 ENCOUNTER — Encounter: Payer: Self-pay | Admitting: Cardiovascular Disease

## 2020-11-29 ENCOUNTER — Other Ambulatory Visit: Payer: Self-pay

## 2020-11-29 VITALS — BP 127/66 | HR 78 | Ht 64.0 in | Wt 113.0 lb

## 2020-11-29 DIAGNOSIS — I2510 Atherosclerotic heart disease of native coronary artery without angina pectoris: Secondary | ICD-10-CM

## 2020-11-29 DIAGNOSIS — I495 Sick sinus syndrome: Secondary | ICD-10-CM

## 2020-11-29 DIAGNOSIS — N186 End stage renal disease: Secondary | ICD-10-CM

## 2020-11-29 DIAGNOSIS — I953 Hypotension of hemodialysis: Secondary | ICD-10-CM | POA: Diagnosis not present

## 2020-11-29 DIAGNOSIS — Z5181 Encounter for therapeutic drug level monitoring: Secondary | ICD-10-CM

## 2020-11-29 DIAGNOSIS — J9611 Chronic respiratory failure with hypoxia: Secondary | ICD-10-CM

## 2020-11-29 DIAGNOSIS — E46 Unspecified protein-calorie malnutrition: Secondary | ICD-10-CM

## 2020-11-29 DIAGNOSIS — I700 Atherosclerosis of aorta: Secondary | ICD-10-CM

## 2020-11-29 DIAGNOSIS — I480 Paroxysmal atrial fibrillation: Secondary | ICD-10-CM | POA: Diagnosis not present

## 2020-11-29 DIAGNOSIS — E8809 Other disorders of plasma-protein metabolism, not elsewhere classified: Secondary | ICD-10-CM

## 2020-11-29 DIAGNOSIS — Z79899 Other long term (current) drug therapy: Secondary | ICD-10-CM

## 2020-11-29 DIAGNOSIS — Z992 Dependence on renal dialysis: Secondary | ICD-10-CM

## 2020-11-29 NOTE — Patient Instructions (Signed)
Medication Instructions:  No Changes *If you need a refill on your cardiac medications before your next appointment, please call your pharmacy*   Lab Work: None If you have labs (blood work) drawn today and your tests are completely normal, you will receive your results only by: Marland Kitchen MyChart Message (if you have MyChart) OR . A paper copy in the mail If you have any lab test that is abnormal or we need to change your treatment, we will call you to review the results.   Testing/Procedures: None   Follow-Up: At [facility], you and your health needs are our priority.  As part of our continuing mission to provide you with exceptional heart care, we have created designated Provider Care Teams.  These Care Teams include your primary Cardiologist (physician) and Advanced Practice Providers (APPs -  Physician Assistants and Nurse Practitioners) who all work together to provide you with the care you need, when you need it.  We recommend signing up for the patient portal called "MyChart".  Sign up information is provided on this After Visit Summary.  MyChart is used to connect with patients for Virtual Visits (Telemedicine).  Patients are able to view lab/test results, encounter notes, upcoming appointments, etc.  Non-urgent messages can be sent to your provider as well.   To learn more about what you can do with MyChart, go to [email or web address].    Your next appointment:   6 month(s)  The format for your next appointment:   In Person  Provider:   Sanda Klein, MD

## 2020-11-29 NOTE — Progress Notes (Signed)
Cardiology Office Note     Date:  11/29/2020   ID:  [name], DOB [birth year], MRN [id number]  PCP:  [name]  Cardiologist:  [name] Klein, MD  Electrophysiologist:  None   Evaluation Performed:  Follow-Up Visit  Chief Complaint:  Tachy-brady sd  History of Present Illness:    [name] [facility] is a 84 y.o. female with with a hx of Paroxysmal atrial fibrillation , diabetes mellitus, end-stage renal disease on HD MWF, known aortic and coronary atherosclerosis, but with a normal nuclear stress test and normal left ventricular ejection fraction, chronis respiratory insufficiency on O2 by [name] 2 l/min.Marland Kitchen   Has had poor HD due to slow flow in her AV fistula and is getting HD on MWF via a right subclavian Permacath, until her fistula is revised. Takes midodrine and has occasional low BP with dialysis. Palpitations have been very brief and not bothersome in last few months.  She had COVID-19 infection in May 2020 and did remarkably well with it.  The diagnosis was confirmed after she developed atrial fibrillation rapid ventricular response during hemodialysis, had some issues with nausea and low-grade fever, but she never had respiratory problems and did not lose her sense of smell or taste.  She denies angina, syncope or dyspnea at rest or edema.   07/06/2018 Echo described mild LVH, LVEF 65-70%, severe left atrial dilatation.  In July she had chest pain and junctional rhythm.  She was evaluated by EP and felt to be poor candidate for a pacemaker due to an occluded left subclavian artery hemodialysis via right arm fistula. Amiodarone and metoprolol were discontinued.  But in September, she was hospitalized with severe symptomatic junctional bradycardia and hypotension, responsive to atropine and IV fluids.  She was again evaluated by EP (a different provider) and it was felt that a pacemaker should not be implanted, for the same reasons.   She has not had either atrial fibrillation  or bradycardia since discharge from [facility] in September.  She requires immediate treatment for symptomatic hypotension. She has a Rx for hydralazine if BP is too high, but rarely needs it.  She is not on anticoagulation since she had problems with GI bleeding. She has been on chronic hemodialysis (MWF, [name] now [name]) since 2015. She is a permanent resident at [name].  Note from 08/24/2018, [name]: "I have discussed the case with Dr. Loleta [facility], [name], and [name]. She has symptomatic sinus node dysfunction and a clear cut indication for a PPM. Unfortunately her access is such the left left subclavian was previously used for HD but is now occluded based on the collaterals on her skin, a right arm that is being used for HD which is functioning poorly and will undergo revision with interventional radiology in the next few days. Placement of a PPM in her right femoral vein would be a consideration but examination of the area is particularly discouraging due to her massive obesity and multiple folds and evidence of chronic fungal infection. I strongly suspect that any insertion of a PPM from the right femoral vein would lead to an cardiac device infection with in a few months. She is quite sedentary and does not walk or transfer and lays in the bed much of the day. Her exam is accurately documented above. Other than an Epicardial PPM, we do not have an option for improving her HR other than possibly adding theophylline or encouraging her to drink more caffeine. She is approaching hospice  level care".  And 08/26/2018 "She has no endovascular access sites. We cannot place an endovascular pacemaker except for a Micra device which is placed via right femoral vein into the RV apex. The placement of a single chamber PM in the RV in a patient with sinus rhythm will result in pacemaker syndrome, will make her feel worse and also make her device dependent. Placement of a standard PM via  the right femoral vein will result in a device infection. The area is dirty.   Her only option for physiologic pacing would be to call CVTS and place and epicardial pacing system with an open chest procedure either with median sternotomy or thoracotomy. I feel strongly that she is not a candidate for this procedure as well.   I would consider stopping the timolol eye drops.   In summary there are no good options for pacing this patient. As I noted, medical options that might increase her HR are theophylline. The therapeutic window for these drugs is very small however."     Past Medical History:  Diagnosis Date  . Abdominal pain   . Anemia, chronic disease   . Anxiety   . Arthritis    knees  . Atrial fibrillation ([place])   . Barrett esophagus   . Benign paroxysmal positional vertigo   . Breast cancer ([place]) 1980s   bil mastectomies, no radiation or chemo  . Cataract   . Chest pain, atypical 12/05/2008   R/Lmv- normal perfusion all regions, noe ECG changes   . CHF (congestive heart failure) ([place]) 05/13/2011   echo - EG>31%; stage 1 diastolic dysfunction; elevated LV filling pressure, MAC  . CKD (chronic kidney disease), stage III ([place])   . Claudication ([place]) 10/30/2005   doppler - normal evaluation, no evidence of aneurysm, diameter reduction, dissection, compression or vascular abnormality)  . Complication of anesthesia   . Controlled type 2 diabetes mellitus with diabetic polyneuropathy, with long-term current use of insulin ([place]) 04/17/2016  . Dyslipidemia   . Dysrhythmia    Atrial Fibrillation  . Edema   . ESRD (end stage renal disease) on dialysis ([place])    "[facility]; [address]; MWF" (07/06/2018)  . Family history of adverse reaction to anesthesia    son also has nausea   . Fever blister 12/2016  . Gastritis and gastroduodenitis   . GERD (gastroesophageal reflux disease)   . Gout   . Hemodialysis-associated hypotension   . HLD (hyperlipidemia)   . Hypertension   . IBS  (irritable bowel syndrome)   . Insomnia   . Iron deficiency anemia   . Junctional bradycardia 08/25/2018  . LFT elevation   . Long term current use of amiodarone 10/17/2016  . Morbid (severe) obesity due to excess calories ([place]) 04/29/2017  . Morbid obesity ([place])   . Nausea & vomiting 10/2020  . On home oxygen therapy    "2L; 24/7" (07/06/2018)  . PAF (paroxysmal atrial fibrillation) ([place])   . Peripheral neuropathy   . Pneumonia 12/2016  . PONV (postoperative nausea and vomiting)   . Renal cyst   . Renal insufficiency 10/30/2005   doppler - abn resistance consistent w/ parenchymal disease  . Renovascular hypertension   . Type 2 diabetes mellitus with diabetic neuropathy, with long-term current use of insulin ([place])   . Vertigo    Past Surgical History:  Procedure Laterality Date  . A/V FISTULAGRAM Left 03/19/2018   Procedure: A/V FISTULAGRAM;  Surgeon: Angelia Mould, MD;  Location: [facility];  Service: Cardiovascular;  Laterality: Left;  . A/V FISTULAGRAM N/A 12/24/2018   Procedure: A/V FISTULAGRAM - Right Arm;  Surgeon: Angelia Mould, MD;  Location: [facility];  Service: Cardiovascular;  Laterality: N/A;  . A/V FISTULAGRAM Right 03/29/2019   Procedure: A/V FISTULAGRAM;  Surgeon: Angelia Mould, MD;  Location: [facility];  Service: Cardiovascular;  Laterality: Right;  . A/V FISTULAGRAM Right 02/03/2020   Procedure: A/V FISTULAGRAM;  Surgeon: Angelia Mould, MD;  Location: [facility];  Service: Cardiovascular;  Laterality: Right;  . A/V SHUNTOGRAM Left 01/19/2018   Procedure: A/V SHUNTOGRAM;  Surgeon: Serafina Mitchell, MD;  Location: [facility];  Service: Cardiovascular;  Laterality: Left;  . ANGIOPLASTY Left 02/12/2015   Procedure: ANGIOPLASTY;  Surgeon: Angelia Mould, MD;  [facility];  Service: Cardiovascular;  Laterality: Left;  AVF  . AV FISTULA PLACEMENT Left 11/30/2014   Procedure: ARTERIOVENOUS  (AV) FISTULA CREATION LEFT ARM;  Surgeon: Angelia Mould, MD;  Location: [facility];  Service: Vascular;  Laterality: Left;  . AV FISTULA PLACEMENT Left 03/20/2015   Procedure:  Inserton of Left Upper Arm Gortex Graft;  Surgeon: Angelia Mould, MD;  Location: [facility];  Service: Vascular;  Laterality: Left;  . AV FISTULA PLACEMENT Left 04/12/2015   Procedure: INSERTION OF LEFT ARM  ARTERIOVENOUS GORE-TEX GRAFT ;  Surgeon: Angelia Mould, MD;  Location: [facility];  Service: Vascular;  Laterality: Left;  . AV FISTULA PLACEMENT Right 06/08/2018   Procedure: INSERTION OF ARTERIOVENOUS (AV) GORE-TEX GRAFT ARM USING 4-7MM X 45CM GORETEX GRAFT;  Surgeon: Angelia Mould, MD;  Location: [facility];  Service: Vascular;  Laterality: Right;  . BACK SURGERY  ~1980  . CATARACT EXTRACTION Right   . CHOLECYSTECTOMY    . ESOPHAGOGASTRODUODENOSCOPY N/A 10/24/2014   Procedure: ESOPHAGOGASTRODUODENOSCOPY (EGD);  Surgeon: Jerene Bears, MD;  Location: [facility] ENDOSCOPY;  Service: Endoscopy;  Laterality: N/A;  . EXCHANGE OF A DIALYSIS CATHETER Left 11/30/2014   Procedure: EXCHANGE OF A DIALYSIS CATHETER, LEFT INTERNAL JUGULAR;  Surgeon: Angelia Mould, MD;  Location: [place];  Service: Vascular;  Laterality: Left;  . EYE SURGERY Left    transplant  . FISTULOGRAM N/A 02/12/2015   Procedure: FISTULOGRAM;  Surgeon: Angelia Mould, MD;  Location: [facility] CATH LAB;  Service: Cardiovascular;  Laterality: N/A;  . HEMORRHOID SURGERY    . IR AV DIALY SHUNT INTRO NEEDLE/INTRACATH INITIAL W/PTA/IMG RIGHT Right 08/25/2018  . IR AV DIALY SHUNT INTRO NEEDLE/INTRACATH INITIAL W/PTA/IMG RIGHT Right 11/17/2018  . IR US GUIDE VASC ACCESS RIGHT  08/25/2018  . LIGATION ARTERIOVENOUS GORTEX GRAFT Left 08/27/2018   Procedure: LIGATION ARTERIOVENOUS GORTEX GRAFT ARM;  Surgeon: Angelia Mould, MD;  Location: [facility];  Service: Vascular;  Laterality: Left;  . LIGATION OF ARTERIOVENOUS  FISTULA Left 03/20/2015   Procedure: LIGATION  OF ARTERIOVENOUS  FISTULA;  Surgeon: Angelia Mould, MD;  Location: [facility];  Service: Vascular;  Laterality: Left;  Marland Kitchen MASTECTOMY Bilateral ~1982   bil breast reconstruction with implants  . ORIF TIBIA & FIBULA FRACTURES Left 2007   also had left non displaced malleolar fracture.   Marland Kitchen PERIPHERAL VASCULAR BALLOON ANGIOPLASTY Left 01/19/2018   Procedure: PERIPHERAL VASCULAR BALLOON ANGIOPLASTY;  Surgeon: Serafina Mitchell, MD;  Location: [facility];  Service: Cardiovascular;  Laterality: Left;  fistula  . PERIPHERAL VASCULAR BALLOON ANGIOPLASTY Left 03/19/2018   Procedure: PERIPHERAL VASCULAR BALLOON ANGIOPLASTY;  Surgeon: [name]  S, MD;  Location: [facility];  Service: Cardiovascular;  Laterality: Left;  UPPER ARM FISTULA  . PERIPHERAL VASCULAR BALLOON ANGIOPLASTY Right 12/24/2018   Procedure: PERIPHERAL VASCULAR BALLOON ANGIOPLASTY;  Surgeon: Angelia Mould, MD;  Location: [place] [facility];  Service: Cardiovascular;  Laterality: Right;  arm fistula  . PERIPHERAL VASCULAR BALLOON ANGIOPLASTY  03/29/2019   Procedure: PERIPHERAL VASCULAR BALLOON ANGIOPLASTY;  Surgeon: Angelia Mould, MD;  Location: [facility];  Service: Cardiovascular;;  . PERIPHERAL VASCULAR BALLOON ANGIOPLASTY Right 02/03/2020   Procedure: PERIPHERAL VASCULAR BALLOON ANGIOPLASTY;  Surgeon: Angelia Mould, MD;  Location: [facility];  Service: Cardiovascular;  Laterality: Right;  upper arm fistula  . PERIPHERAL VASCULAR BALLOON ANGIOPLASTY  06/22/2020   Procedure: PERIPHERAL VASCULAR BALLOON ANGIOPLASTY;  Surgeon: Angelia Mould, MD;  Location: [facility];  Service: Cardiovascular;;  Right AVF  . REMOVAL OF GRAFT Left 04/12/2015   Procedure: REMOVAL OF LEFT ARM ARTERIOVENOUS GORE-TEX GRAFT;  Surgeon: Angelia Mould, MD;  Location: [facility];  Service: Vascular;  Laterality: Left;  . REVISION OF ARTERIOVENOUS GORETEX GRAFT Right 12/13/2019   Procedure:  REVISION OF ARTERIOVENOUS GORETEX GRAFT RIGHT FOREARM;  Surgeon: Angelia Mould, MD;  Location: [facility] OR;  Service: Vascular;  Laterality: Right;     Current Meds  Medication Sig  . acetaminophen (TYLENOL) 325 MG tablet Take 650 mg by mouth every 6 (six) hours as needed for fever (greater than 100 F).   Marland Kitchen allopurinol (ZYLOPRIM) 100 MG tablet Take 100 mg by mouth daily.   Marland Kitchen amiodarone (PACERONE) 200 MG tablet Take 2 tablets (467m) daily for 7 days. Thereafter take 1 tablet (2018m daily. (Patient taking differently: Take 200 mg by mouth daily.)  . ascorbic acid (HM VITAMIN C) 500 MG tablet Take 500 mg by mouth daily.   . Marland Kitchenspirin 81 MG chewable tablet Chew 81 mg by mouth daily.   . Marland Kitchen complex-vitamin c-folic acid (NEPHRO-VITE) 0.8 MG TABS tablet Take 1 tablet by mouth daily.   . Brinzolamide-Brimonidine 1-0.2 % SUSP Place 1 drop into both eyes 2 (two) times daily.   . calcium carbonate (TUMS - DOSED IN MG ELEMENTAL CALCIUM) 500 MG chewable tablet Chew 1 tablet by mouth 2 (two) times daily.  . cetirizine (ZYRTEC) 10 MG tablet Take 10 mg by mouth daily.  . collagenase (SANTYL) ointment Apply 1 application topically daily. Open ulcer to left posterior flank wound bed  . diazepam (VALIUM) 5 MG tablet Take 1 tablet (5 mg total) by mouth at bedtime.  . diclofenac Sodium (VOLTAREN) 1 % GEL Apply 2 g topically See admin instructions. Apply 2 grams to the right knee at bedtime for pain  . DIMETHICONE, TOPICAL, (SECURA DIMETHICONE PROTECTANT) 5 % CREA Apply 1 application topically See admin instructions. Apply a nickel-sized amount onto the buttocks area 2 times a day for barrier protection  . diphenoxylate-atropine (LOMOTIL) 2.5-0.025 MG tablet Take 1 tablet by mouth every 6 (six) hours as needed for diarrhea or loose stools. Take 1 tablet by mouth every 6 hours PRN for diarrhea or loose stools  . fluticasone (FLONASE) 50 MCG/ACT nasal spray Place 2 sprays into both nostrils every 12 (twelve) hours.    . Marland Kitchenpratropium (ATROVENT) 0.03 % nasal spray Place 2 sprays into both nostrils every 12 (twelve) hours.  . Marland Kitchenoperamide (IMODIUM A-D) 2 MG tablet Take 2 tablets (4 mg total) by mouth every 8 (eight) hours as needed for diarrhea or loose stools (for  diarrhea). (Patient taking differently: Take 4 mg by mouth See admin instructions. Take 2 tablets (4 mg) Daily on MWF; May have 2 tablets (4 mg) prior to Hemodialysis)  . Melatonin 5 MG CAPS Take 10 mg by mouth at bedtime.   . Menthol, Topical Analgesic, (BIOFREEZE) 4 % GEL Apply 1 application topically 3 (three) times daily as needed (bilateral knee pain).  . midodrine (PROAMATINE) 10 MG tablet Take 10 mg by mouth See admin instructions. Take 1 tablet (10 mg) on MWF for BP Support At Dialysis  . midodrine (PROAMATINE) 2.5 MG tablet Take 2.5 mg by mouth 3 (three) times daily with meals. Give if BP<120/60  . OXYGEN Inhale 2 L into the lungs continuous.   . pantoprazole (PROTONIX) 40 MG tablet Take 1 tablet (40 mg total) by mouth 2 (two) times daily.  . polyethylene glycol (MIRALAX / GLYCOLAX) 17 g packet Take 17 g by mouth daily as needed for moderate constipation.  . promethazine (PHENERGAN) 25 MG tablet Take 25 mg by mouth 2 (two) times daily as needed for nausea.   Marland Kitchen SALINE NASAL MIST NA Place 2 sprays into the nose 3 (three) times daily.  . sodium phosphate (FLEET) 7-19 GM/118ML ENEM Place 1 enema rectally daily as needed for severe constipation.  . traMADol (ULTRAM) 50 MG tablet Take 1-2 tablets (50-100 mg total) by mouth See admin instructions. Take 1 tablet (50 mg totally) by mouth every 6 hours as needed for mild - moderate pain; take 2 tablets (120m totally) by mouth as needed for moderate - severe pain  . traZODone (DESYREL) 50 MG tablet Take 1 tablet (50 mg total) by mouth at bedtime.     Allergies:   Erythromycin, Codeine, Erythromycin base, Penicillins, and Zantac [ranitidine hcl]   Social History   Tobacco Use  . Smoking status: Never  Smoker  . Smokeless tobacco: Never Used  Vaping Use  . Vaping Use: Never used  Substance Use Topics  . Alcohol use: No  . Drug use: No     Family Hx: The patient's family history includes Diabetes in her mother; Heart attack in her father; Heart disease in her brother and mother; Lung disease in her brother; Multiple myeloma in her sister; Non-Hodgkin's lymphoma in her mother; Parkinson's disease in her brother; Stroke in her father.  ROS:   Please see the history of present illness.     All other systems reviewed and are negative.   Prior CV studies:   The following studies were reviewed today:  Notes from emergency room department visit on May 25  Labs/Other Tests and Data Reviewed:    EKG:  Not ordered today. I reviewed the ECG from 10/29/2020 - NSR at 75 bpm, incomplete LBBB (QRS 114 ms).  Recent Labs: 08/01/2020: B Natriuretic Peptide 1,092.0; TSH 0.947 10/29/2020: ALT 19; Magnesium 2.2 11/01/2020: BUN 50; Creatinine, Ser 4.93; Potassium 4.0; Sodium 137 11/02/2020: Hemoglobin 8.6; Platelets 203   Recent Lipid Panel Lab Results  Component Value Date/Time   CHOL 157 08/24/2018 04:23 AM   TRIG 147 08/24/2018 04:23 AM   HDL 32 (L) 08/24/2018 04:23 AM   CHOLHDL 4.9 08/24/2018 04:23 AM   LDLCALC 96 08/24/2018 04:23 AM    Wt Readings from Last 3 Encounters:  11/29/20 113 lb (51.3 kg)  11/01/20 113 lb 1.5 oz (51.3 kg)  10/04/20 114 lb (51.7 kg)     Objective:    Vital Signs:  BP 127/66   Pulse 78   Ht _0  (  1.626 m)   Wt 113 lb (51.3 kg)   SpO2 100%   BMI 19.40 kg/m     General: Alert, oriented x3, no distress, frail, pale, uderweight. R SCL permacath. AV fistula with loud bruit, weak thrill. Head: no evidence of trauma, PERRL, EOMI, no exophtalmos or lid lag, no myxedema, no xanthelasma; normal ears, nose and oropharynx Neck: normal jugular venous pulsations and no hepatojugular reflux; brisk carotid pulses without delay and no carotid bruits Chest: clear  to auscultation, no signs of consolidation by percussion or palpation, normal fremitus, symmetrical and full respiratory excursions Cardiovascular: normal position and quality of the apical impulse, regular rhythm, normal first and second heart sounds, no murmurs, rubs or gallops Abdomen: no tenderness or distention, no masses by palpation, no abnormal pulsatility or arterial bruits, normal bowel sounds, no hepatosplenomegaly Extremities: no clubbing, cyanosis or edema; 2+ radial, ulnar and brachial pulses bilaterally; 2+ right femoral, posterior tibial and dorsalis pedis pulses; 2+ left femoral, posterior tibial and dorsalis pedis pulses; no subclavian or femoral bruits Neurological: grossly nonfocal Psych: Normal mood and affect  ASSESSMENT & PLAN:    1. Paroxysmal atrial fibrillation (HCC)   2. SSS (sick sinus syndrome) ([place])   3. Hemodialysis-associated hypotension   4. ESRD on dialysis ([place])   5. Aortic atherosclerosis ([place])   6. Hypoalbuminemia due to protein-calorie malnutrition ([place])   7. Encounter for monitoring amiodarone therapy   8. Chronic respiratory failure with hypoxia (HCC)      1. AFib:  On amiodarone, palpitations have subsided and she has not had severe tachycardia. Watch for high grade AV block or sinus bradycardia. Intolerant of conventional AV blocking agents due to hypotension. Digoxin stopped at her September appointment with Dr. Quentin Ore. 2. SSS:  Asymptomatic recently, very sedentary. Evaluated by 2 different EP specialists who have deemed her a poor candidate for device therapy.  3. Hypotension:  Occasional brief hypotension during dialysis, despite treatment with midodrine.  No symptomatic hypotension or syncope. 4. ESRD:  She has had multiple revisions of her AV axis in both arms.  Now on HD via Permacath. 5. Ao atherosclerosis: No angina. Has evidence of peripheral arterial disease as well as coronary calcification on chest CT, thankfully asymptomatic(except for  chest pain in July, during arrhythmia). Had a normal nuclear stress test in 2015. LDL<100without meds in September 2019.  She has lost substantial weight and I suspect that her cholesterol level is even lower now.  On aspirin.  6. Hypoalbuminemia: She remains quite undernourished. Albumin only 2.1 last month. Prognosis is poor. 7. Amiodarone: check TSH (last checked in Sept) and LFTs (last checked in November) q6 months on amio. 8. On home O2  Patient Instructions  Medication Instructions:  No Changes *If you need a refill on your cardiac medications before your next appointment, please call your pharmacy*   Lab Work: None If you have labs (blood work) drawn today and your tests are completely normal, you will receive your results only by: Marland Kitchen MyChart Message (if you have MyChart) OR . A paper copy in the mail If you have any lab test that is abnormal or we need to change your treatment, we will call you to review the results.   Testing/Procedures: None   Follow-Up: At [facility], you and your health needs are our priority.  As part of our continuing mission to provide you with exceptional heart care, we have created designated Provider Care Teams.  These Care Teams include your primary Cardiologist (physician) and Advanced  Practice Providers (APPs -  Physician Assistants and Nurse Practitioners) who all work together to provide you with the care you need, when you need it.  We recommend signing up for the patient portal called "MyChart".  Sign up information is provided on this After Visit Summary.  MyChart is used to connect with patients for Virtual Visits (Telemedicine).  Patients are able to view lab/test results, encounter notes, upcoming appointments, etc.  Non-urgent messages can be sent to your provider as well.   To learn more about what you can do with MyChart, go to [email or web address].    Your next appointment:   6 month(s)  The format for your next appointment:    In Person  Provider:   Sanda Klein, MD       Signed, [name] Klein, MD  11/29/2020 3:01 PM    [employer]

## 2020-12-11 ENCOUNTER — Encounter (HOSPITAL_BASED_OUTPATIENT_CLINIC_OR_DEPARTMENT_OTHER): Payer: Medicare Other | Attending: Internal Medicine | Admitting: Internal Medicine

## 2020-12-11 ENCOUNTER — Other Ambulatory Visit: Payer: Self-pay

## 2020-12-11 DIAGNOSIS — I5032 Chronic diastolic (congestive) heart failure: Secondary | ICD-10-CM | POA: Insufficient documentation

## 2020-12-11 DIAGNOSIS — I110 Hypertensive heart disease with heart failure: Secondary | ICD-10-CM | POA: Diagnosis not present

## 2020-12-11 DIAGNOSIS — E1122 Type 2 diabetes mellitus with diabetic chronic kidney disease: Secondary | ICD-10-CM | POA: Diagnosis not present

## 2020-12-11 DIAGNOSIS — L89151 Pressure ulcer of sacral region, stage 1: Secondary | ICD-10-CM | POA: Diagnosis not present

## 2020-12-11 DIAGNOSIS — L89102 Pressure ulcer of unspecified part of back, stage 2: Secondary | ICD-10-CM | POA: Diagnosis not present

## 2020-12-11 DIAGNOSIS — N186 End stage renal disease: Secondary | ICD-10-CM | POA: Diagnosis not present

## 2020-12-11 DIAGNOSIS — Z992 Dependence on renal dialysis: Secondary | ICD-10-CM | POA: Diagnosis not present

## 2020-12-12 NOTE — Progress Notes (Signed)
[name] James, [name] James ([id number]) Visit Report for 12/11/2020 Abuse/Suicide Risk Screen Details Patient Name: Date of Service: [name] James, [name] James. 12/11/2020 1:15 PM Medical Record Number: [id number] Patient Account Number: [id number] Date of Birth/Sex: Treating RN: March 05, 1936 (84 y.o. Orvan Falconer Primary Care Mairin Lindsley: PLA CE, [place] MDEN Other Clinician: Referring Arnel Wymer: Treating Xavia Kniskern/Extender: Linton Ham [place] CE, [place] MDEN Weeks in Treatment: 0 Abuse/Suicide Risk Screen Items Answer ABUSE RISK SCREEN: Has anyone close to you tried to hurt or harm you recentlyo No Do you feel uncomfortable with anyone in your familyo No Has anyone forced you do things that you didnt want to doo No Electronic Signature(s) Signed: 12/12/2020 5:14:06 PM By: Carlene Coria RN Entered By: Carlene Coria on 12/11/2020 13:48:17 -------------------------------------------------------------------------------- Activities of Daily Living Details Patient Name: Date of Service: [name] James, [name] James 12/11/2020 1:15 PM Medical Record Number: [id number] Patient Account Number: [id number] Date of Birth/Sex: Treating RN: [birth year] (84 y.o. Orvan Falconer Primary Care Rheya Minogue: PLA CE, [place] MDEN Other Clinician: Referring Madysun Thall: Treating Ayah Cozzolino/Extender: Linton Ham [place] CE, [place] MDEN Weeks in Treatment: 0 Activities of Daily Living Items Answer Activities of Daily Living (Please select one for each item) Drive Automobile Not Able T Medications ake Need Assistance Use T elephone Need Assistance Care for Appearance Need Assistance Use T oilet Need Assistance Bath / Shower Need Assistance Dress Self Need Assistance Feed Self Completely Able Walk Not Able Get In / Out Bed Not Able Housework Not Able Prepare Meals Need Assistance Handle Money Not Able Shop for Self Not Able Electronic Signature(s) Signed: 12/12/2020 5:14:06 PM By: Carlene Coria RN Entered By: Carlene Coria on 12/11/2020  13:48:47 -------------------------------------------------------------------------------- Education Screening Details Patient Name: Date of Service: [name] Macho. 12/11/2020 1:15 PM Medical Record Number: [id number] Patient Account Number: [id number] Date of Birth/Sex: Treating RN: Jun 22, 1936 (84 y.o. Orvan Falconer Primary Care Vangie Henthorn: PLA CE, [place] MDEN Other Clinician: Referring Victorious Cosio: Treating Talesha Ellithorpe/Extender: Linton Ham [place] CE, [place] MDEN Weeks in Treatment: 0 Primary Learner Assessed: Patient Learning Preferences/Education Level/Primary Language Learning Preference: Explanation Highest Education Level: High School Preferred Language: English Cognitive Barrier Language Barrier: No Translator Needed: No Memory Deficit: No Emotional Barrier: No Cultural/Religious Beliefs Affecting Medical Care: No Physical Barrier Impaired Vision: Yes Glasses Impaired Hearing: No Decreased Hand dexterity: No Knowledge/Comprehension Knowledge Level: Medium Comprehension Level: High Ability to understand written instructions: High Ability to understand verbal instructions: High Motivation Anxiety Level: Anxious Cooperation: Cooperative Education Importance: Acknowledges Need Interest in Health Problems: Asks Questions Perception: Coherent Willingness to Engage in Self-Management High Activities: Readiness to Engage in Self-Management High Activities: Electronic Signature(s) Signed: 12/12/2020 5:14:06 PM By: Carlene Coria RN Entered By: Carlene Coria on 12/11/2020 13:50:38 -------------------------------------------------------------------------------- Fall Risk Assessment Details Patient Name: Date of Service: [name] James, [name] Lora. 12/11/2020 1:15 PM Medical Record Number: [id number] Patient Account Number: [id number] Date of Birth/Sex: Treating RN: Mar 31, 1936 (84 y.o. Orvan Falconer Primary Care Pritika Alvarez: PLA CE, [place] MDEN Other Clinician: Referring Carson Meche: Treating  Rosendo Couser/Extender: Linton Ham PLA CE, [place] MDEN Weeks in Treatment: 0 Fall Risk Assessment Items Have you had 2 or more falls in the last 12 monthso 0 No Have you had any fall that resulted in injury in the last 12 monthso 0 No FALLS RISK SCREEN History of falling - immediate or within 3 months 25 Yes Secondary diagnosis (Do you have 2 or more medical diagnoseso) 0 No Ambulatory aid None/bed rest/wheelchair/nurse 0 No Crutches/cane/walker 0 No Furniture 0 No Intravenous therapy Access/Saline/Heparin Lock 0 No  Gait/Transferring Normal/ bed rest/ wheelchair 0 No Weak (short steps with or without shuffle, stooped but able to lift head while walking, may seek 0 No support from furniture) Impaired (short steps with shuffle, may have difficulty arising from chair, head down, impaired 0 No balance) Mental Status Oriented to own ability 0 No Electronic Signature(s) Signed: 12/12/2020 5:14:06 PM By: Carlene Coria RN Entered By: Carlene Coria on 12/11/2020 13:50:55 -------------------------------------------------------------------------------- Nutrition Risk Screening Details Patient Name: Date of Service: [name] James, [name] James 12/11/2020 1:15 PM Medical Record Number: [id number] Patient Account Number: [id number] Date of Birth/Sex: Treating RN: 02-Nov-1936 (84 y.o. Orvan Falconer Primary Care Toula Miyasaki: PLA CE, [place] MDEN Other Clinician: Referring Aaden Buckman: Treating Loveah Like/Extender: Linton Ham PLA CE, [place] MDEN Weeks in Treatment: 0 Height (in): 61 Weight (lbs): 113 Body Mass Index (BMI): 21.3 Nutrition Risk Screening Items Score Screening NUTRITION RISK SCREEN: I have an illness or condition that made me change the kind and/or amount of food I eat 0 No I eat fewer than two meals per day 0 No I eat few fruits and vegetables, or milk products 0 No I have three or more drinks of beer, liquor or wine almost every day 0 No I have tooth or mouth problems that make it hard for me to  eat 0 No I don't always have enough money to buy the food I need 0 No I eat alone most of the time 0 No I take three or more different prescribed or over-the-counter drugs a day 1 Yes Without wanting to, I have lost or gained 10 pounds in the last six months 2 Yes I am not always physically able to shop, cook and/or feed myself 2 Yes Nutrition Protocols Good Risk Protocol Moderate Risk Protocol 0 Provide education on nutrition High Risk Proctocol Risk Level: Moderate Risk Score: 5 Electronic Signature(s) Signed: 12/12/2020 5:14:06 PM By: Carlene Coria RN Entered By: Carlene Coria on 12/11/2020 13:51:17

## 2020-12-12 NOTE — Progress Notes (Signed)
[name] James, [name] James ([id number]) Visit Report for 12/11/2020 Chief Complaint Document Details Patient Name: Date of Service: [name] James, [name] James. 12/11/2020 1:15 PM Medical Record Number: [id number] Patient Account Number: [id number] Date of Birth/Sex: Treating RN: [birth year] (84 y.o. [name] Primary Care Provider: PLA CE, CA MDEN Other Clinician: Referring Provider: Treating Provider/Extender: Linton Ham PLA CE, CA MDEN Weeks in Treatment: 0 Information Obtained from: Patient Chief Complaint 12/11/2020. Patient is here for review of pressure areas on her lower sacrum/coccyx and also on her right back Electronic Signature(s) Signed: 12/11/2020 5:57:42 PM By: Linton Ham MD Entered By: Linton Ham on 12/11/2020 15:19:04 -------------------------------------------------------------------------------- HPI Details Patient Name: Date of Service: [name] Balzarine M. 12/11/2020 1:15 PM Medical Record Number: [id number] Patient Account Number: [id number] Date of Birth/Sex: Treating RN: 12-18-1936 (84 y.o. [name] Primary Care Provider: PLA CE, CA MDEN Other Clinician: Referring Provider: Treating Provider/Extender: Linton Ham PLA CE, CA MDEN Weeks in Treatment: 0 History of Present Illness HPI Description: ADMISSION 12/11/2020 This is an 84 year old woman who is a permanent resident of [facility]. She has been there for 6 years. She is here for review of advertise pressure areas on her right lower back and also her coccyx and surrounding bilateral buttocks. They have been there for several months. Her daughter who is present says that the patient had a fall off a [employer]oyer lift earlier this year and she wonders whether that it something to do with this however I cannot find anything that would support that assumption. There is some suggesting that they have been using Santyl on these wounds although I did not have any documentation from  the facility. Has a stage II wound on the left lower back. Nothing really open on the coccyx however the skin is excoriated flaking. Past medical history includes atrial fibrillation/sick sinus syndrome, chronic renal failure which the daughter says is secondary to diabetes although I did not see this on her problem list, diastolic heart failure, remote bilateral mastectomies, history of low back surgery, hypertension The patient is nonambulatory. I am really not particularly sure why. She needs Hoyer lift transfers. Of course she has to sit at dialysis I cannot imagine it is easy for her to reposition herself. Electronic Signature(s) Signed: 12/11/2020 5:57:42 PM By: Linton Ham MD Entered By: Linton Ham on 12/11/2020 15:33:32 -------------------------------------------------------------------------------- Physical Exam Details Patient Name: Date of Service: [name] Macho. 12/11/2020 1:15 PM Medical Record Number: [id number] Patient Account Number: [id number] Date of Birth/Sex: Treating RN: 15-Aug-1936 (84 y.o. [name] Primary Care Provider: PLA CE, CA MDEN Other Clinician: Referring Provider: Treating Provider/Extender: Linton Ham PLA CE, CA MDEN Weeks in Treatment: 0 Constitutional Patient is hypertensive.. Pulse regular and within target range for patient.Marland Kitchen Respirations regular, non-labored and within target range.. Temperature is normal and within the target range for the patient.Marland Kitchen Appears in no distress. Respiratory work of breathing is normal. Gastrointestinal (GI) Abdomen is soft and non-distended without masses or tenderness.. Notes Wound exam; The patient has a superficial stage II wound on the left lower back. This looks fairly healthy there is advancing epithelialization. There is nothing on here that requires debridement to either mechanically or enzymatically The patient does not have an open wound on the lower sacrum but she does have  erythematous flaking skin. I think this is a result of chronic friction and moisture certainly at risk of breaking down if this is not relieved. Electronic Signature(s) Signed: 12/11/2020 5:57:42 PM By: Linton Ham  MD Entered By: Linton Ham on 12/11/2020 15:47:07 -------------------------------------------------------------------------------- Physician Orders Details Patient Name: Date of Service: [name] James, NORBY. 12/11/2020 1:15 PM Medical Record Number: [id number] Patient Account Number: [id number] Date of Birth/Sex: Treating RN: 1936-03-16 (84 y.o. [name] Primary Care Provider: PLA CE, CA MDEN Other Clinician: Referring Provider: Treating Provider/Extender: Linton Ham PLA CE, CA MDEN Weeks in Treatment: 0 Verbal / Phone Orders: No Diagnosis Coding Follow-up Appointments Return appointment in 3 weeks. Bathing/ Shower/ Hygiene May shower and wash wound with soap and water. - with dressing changes only. Off-Loading Gel wheelchair cushion Turn and reposition every 2 hours Wound Treatment Wound #1 - Back Wound Laterality: Left, Distal Cleanser: Soap and Water Every Other Day/30 Days Discharge Instructions: May shower and wash wound with dial antibacterial soap and water prior to dressing change. Cleanser: Wound Cleanser Every Other Day/30 Days Discharge Instructions: Cleanse the wound with wound cleanser prior to applying a clean dressing using gauze sponges, not tissue or cotton balls. Peri-Wound Care: Skin Prep Every Other Day/30 Days Discharge Instructions: Use skin prep as directed Prim Dressing: PolyMem Non-Adhesive Dressing, 4x4 in Every Other Day/30 Days ary Discharge Instructions: Apply to wound bed as instructed Secondary Dressing: Optifoam Non-Adhesive Dressing, 4x4 in Every Other Day/30 Days Discharge Instructions: Apply over primary dressing as directed. Wound #2 - Coccyx Cleanser: Soap and Water 1 x Per Day/30 Days Discharge  Instructions: May shower and wash wound with dial antibacterial soap and water prior to dressing change. Cleanser: Wound Cleanser 1 x Per Day/30 Days Discharge Instructions: Cleanse the wound with wound cleanser prior to applying a clean dressing using gauze sponges, not tissue or cotton balls. Peri-Wound Care: Zinc Oxide Ointment 30g tube 1 x Per Day/30 Days Discharge Instructions: Apply Zinc Oxide to periwound with each dressing change Notes Coccyx wound area is to be cleaned with soap/water and coated with zinc oxide cream daily. No need to cover coccyx area. Electronic Signature(s) Signed: 12/11/2020 5:57:42 PM By: Linton Ham MD Signed: 12/12/2020 5:16:00 PM By: Rhae Hammock RN Entered By: Rhae Hammock on 12/11/2020 14:43:16 -------------------------------------------------------------------------------- Problem List Details Patient Name: Date of Service: [name] Macho. 12/11/2020 1:15 PM Medical Record Number: [id number] Patient Account Number: [id number] Date of Birth/Sex: Treating RN: 1936-01-11 (84 y.o. Tonita Phoenix, Lauren Primary Care Provider: PLA CE, CA MDEN Other Clinician: Referring Provider: Treating Provider/Extender: Linton Ham PLA CE, CA MDEN Weeks in Treatment: 0 Active Problems ICD-10 Encounter Code Description Active Date MDM Diagnosis L89.151 Pressure ulcer of sacral region, stage 1 12/11/2020 No Yes L89.102 Pressure ulcer of unspecified part of back, stage 2 12/11/2020 No Yes Inactive Problems Resolved Problems Electronic Signature(s) Signed: 12/11/2020 5:57:42 PM By: Linton Ham MD Entered By: Linton Ham on 12/11/2020 14:46:38 -------------------------------------------------------------------------------- Progress Note Details Patient Name: Date of Service: [name] Macho. 12/11/2020 1:15 PM Medical Record Number: [id number] Patient Account Number: [id number] Date of Birth/Sex: Treating RN: Nov 07, 1936 (84 y.o. [name] Primary Care Provider: PLA CE, CA MDEN Other Clinician: Referring Provider: Treating Provider/Extender: Linton Ham PLA CE, CA MDEN Weeks in Treatment: 0 Subjective Chief Complaint Information obtained from Patient 12/11/2020. Patient is here for review of pressure areas on her lower sacrum/coccyx and also on her right back History of Present Illness (HPI) ADMISSION 12/11/2020 This is an 84 year old woman who is a permanent resident of [facility] skilled facility. She has been there for 6 years. She is here for review of advertise pressure areas on her right lower back and also  her coccyx and surrounding bilateral buttocks. They have been there for several months. Her daughter who is present says that the patient had a fall off a Hoyer lift earlier this year and she wonders whether that it something to do with this however I cannot find anything that would support that assumption. There is some suggesting that they have been using Santyl on these wounds although I did not have any documentation from the facility. Has a stage II wound on the left lower back. Nothing really open on the coccyx however the skin is excoriated flaking. Past medical history includes atrial fibrillation/sick sinus syndrome, chronic renal failure which the daughter says is secondary to diabetes although I did not see this on her problem list, diastolic heart failure, remote bilateral mastectomies, history of low back surgery, hypertension The patient is nonambulatory. I am really not particularly sure why. She needs Hoyer lift transfers. Of course she has to sit at dialysis I cannot imagine it is easy for her to reposition herself. Patient History Information obtained from Patient. Allergies penicillin Family History Cancer - Mother,Siblings, Diabetes - Siblings,Maternal Grandparents, Heart Disease - Maternal Grandparents,Paternal Grandparents,Mother,Father,Siblings, Hypertension - Maternal  Grandparents,Paternal Grandparents,Mother,Father,Siblings,Child, Kidney Disease - Siblings, Lung Disease - Siblings, Stroke - Siblings, No family history of Hereditary Spherocytosis, Seizures, Thyroid Problems, Tuberculosis. Social History Never smoker, Marital Status - Divorced, Alcohol Use - Never, Drug Use - No History, Caffeine Use - Rarely. Medical History Eyes Denies history of Cataracts, Glaucoma, Optic Neuritis Ear/Nose/Mouth/Throat Denies history of Chronic sinus problems/congestion, Middle ear problems Hematologic/Lymphatic Denies history of Anemia, Hemophilia, Human Immunodeficiency Virus, Lymphedema, Sickle Cell Disease Respiratory Denies history of Aspiration, Asthma, Chronic Obstructive Pulmonary Disease (COPD), Pneumothorax, Sleep Apnea, Tuberculosis Cardiovascular Patient has history of Congestive Heart Failure, Peripheral Venous Disease Denies history of Angina, Arrhythmia, Coronary Artery Disease, Deep Vein Thrombosis, Hypertension, Hypotension, Myocardial Infarction, Peripheral Arterial Disease, Phlebitis, Vasculitis Gastrointestinal Denies history of Cirrhosis , Colitis, Crohnoos, Hepatitis A, Hepatitis B, Hepatitis C Endocrine Patient has history of Type II Diabetes Denies history of Type I Diabetes Genitourinary Patient has history of End Stage Renal Disease Immunological Denies history of Lupus Erythematosus, Raynaudoos, Scleroderma Integumentary (Skin) Denies history of History of Burn Musculoskeletal Denies history of Gout, Rheumatoid Arthritis, Osteoarthritis, Osteomyelitis Neurologic Denies history of Dementia, Neuropathy, Quadriplegia, Paraplegia, Seizure Disorder Oncologic Denies history of Received Chemotherapy, Received Radiation Psychiatric Denies history of Anorexia/bulimia, Confinement Anxiety Patient is treated with Controlled Diet. Blood sugar is not tested. Medical A Surgical History Notes nd Genitourinary dialysis monday wedensday  friday Review of Systems (ROS) Constitutional Symptoms (General Health) Denies complaints or symptoms of Fatigue, Fever, Chills, Marked Weight Change. Eyes Denies complaints or symptoms of Dry Eyes, Vision Changes, Glasses / Contacts. Ear/Nose/Mouth/Throat Denies complaints or symptoms of Chronic sinus problems or rhinitis. Respiratory Denies complaints or symptoms of Chronic or frequent coughs, Shortness of Breath. Cardiovascular Denies complaints or symptoms of Chest pain. Gastrointestinal Denies complaints or symptoms of Frequent diarrhea, Nausea, Vomiting. Endocrine Denies complaints or symptoms of Heat/cold intolerance. Genitourinary Denies complaints or symptoms of Frequent urination. Integumentary (Skin) Complains or has symptoms of Wounds. Musculoskeletal Denies complaints or symptoms of Muscle Pain, Muscle Weakness. Neurologic Denies complaints or symptoms of Numbness/parasthesias. Psychiatric Denies complaints or symptoms of Claustrophobia, Suicidal. Objective Constitutional Patient is hypertensive.. Pulse regular and within target range for patient.Marland Kitchen Respirations regular, non-labored and within target range.. Temperature is normal and within the target range for the patient.Marland Kitchen Appears in no distress. Vitals Time Taken: 1:30 PM, Height: 61 in,  Source: Stated, Weight: 113 lbs, Source: Stated, BMI: 21.3, Temperature: 97. F, Pulse: 74 bpm, Respiratory Rate: 18 breaths/min, Blood Pressure: 147/67 mmHg. Respiratory work of breathing is normal. Gastrointestinal (GI) Abdomen is soft and non-distended without masses or tenderness.. General Notes: Wound exam; ooThe patient has a superficial stage II wound on the left lower back. This looks fairly healthy there is advancing epithelialization. There is nothing on here that requires debridement to either mechanically or enzymatically ooThe patient does not have an open wound on the lower sacrum but she does have erythematous  flaking skin. I think this is a result of chronic friction and moisture certainly at risk of breaking down if this is not relieved. Integumentary (Hair, Skin) Wound #1 status is Open. Original cause of wound was Gradually Appeared. The wound is located on the Left,Distal Back. The wound measures 2.7cm length x 2.3cm width x 0.1cm depth; 4.877cm^2 area and 0.488cm^3 volume. There is Fat Layer (Subcutaneous Tissue) exposed. There is no tunneling or undermining noted. There is a medium amount of serosanguineous drainage noted. The wound margin is distinct with the outline attached to the wound base. There is large (67-100%) red, pink granulation within the wound bed. There is no necrotic tissue within the wound bed. Wound #2 status is Open. Original cause of wound was Gradually Appeared. The wound is located on the Coccyx. The wound measures 0.5cm length x 1cm width x 0.1cm depth; 0.393cm^2 area and 0.039cm^3 volume. The wound is limited to skin breakdown. There is no tunneling or undermining noted. There is a medium amount of serosanguineous drainage noted. The wound margin is distinct with the outline attached to the wound base. There is large (67-100%) red, pink granulation within the wound bed. There is no necrotic tissue within the wound bed. Assessment Active Problems ICD-10 Pressure ulcer of sacral region, stage 1 Pressure ulcer of unspecified part of back, stage 2 Plan Follow-up Appointments: Return appointment in 3 weeks. Bathing/ Shower/ Hygiene: May shower and wash wound with soap and water. - with dressing changes only. Off-Loading: Gel wheelchair cushion Turn and reposition every 2 hours General Notes: Coccyx wound area is to be cleaned with soap/water and coated with zinc oxide cream daily. No need to cover coccyx area. WOUND #1: - Back Wound Laterality: Left, Distal Cleanser: Soap and Water Every Other Day/30 Days Discharge Instructions: May shower and wash wound with dial  antibacterial soap and water prior to dressing change. Cleanser: Wound Cleanser Every Other Day/30 Days Discharge Instructions: Cleanse the wound with wound cleanser prior to applying a clean dressing using gauze sponges, not tissue or cotton balls. Peri-Wound Care: Skin Prep Every Other Day/30 Days Discharge Instructions: Use skin prep as directed Prim Dressing: PolyMem Non-Adhesive Dressing, 4x4 in Every Other Day/30 Days ary Discharge Instructions: Apply to wound bed as instructed Secondary Dressing: Optifoam Non-Adhesive Dressing, 4x4 in Every Other Day/30 Days Discharge Instructions: Apply over primary dressing as directed. WOUND #2: - Coccyx Wound Laterality: Cleanser: Soap and Water 1 x Per Day/30 Days Discharge Instructions: May shower and wash wound with dial antibacterial soap and water prior to dressing change. Cleanser: Wound Cleanser 1 x Per Day/30 Days Discharge Instructions: Cleanse the wound with wound cleanser prior to applying a clean dressing using gauze sponges, not tissue or cotton balls. Peri-Wound Care: Zinc Oxide Ointment 30g tube 1 x Per Day/30 Days Discharge Instructions: Apply Zinc Oxide to periwound with each dressing change 1. T the area on the lower back we recommended polymen which can  be changed every other day. o 2. In terms of the area on the coccyx this is going to be a more difficult solution. Of course offloading this religiously is of paramount importance. She certainly needs a skin protector zinc oxide perhaps ABDs over this area to help when she is in the wheelchair especially at dialysis. She is at risk of this breaking down further but right now I do not think there is anything open in this area. 3. The patient's daughter says she is a diabetic I must say I did not see this in her problem list on [facility] link. I was assuming that her renal failure was secondary to nephrosclerosis I spent 35 minutes in review of this patient's past medical history,  face-to-face evaluation and preparation of this record Electronic Signature(s) Signed: 12/11/2020 5:57:42 PM By: Linton Ham MD Entered By: Linton Ham on 12/11/2020 15:49:06 -------------------------------------------------------------------------------- HxROS Details Patient Name: Date of Service: [name] Macho. 12/11/2020 1:15 PM Medical Record Number: [id number] Patient Account Number: [id number] Date of Birth/Sex: Treating RN: 05/28/1936 (84 y.o. Orvan Falconer Primary Care Provider: PLA CE, CA MDEN Other Clinician: Referring Provider: Treating Provider/Extender: Linton Ham PLA CE, CA MDEN Weeks in Treatment: 0 Information Obtained From Patient Constitutional Symptoms (General Health) Complaints and Symptoms: Negative for: Fatigue; Fever; Chills; Marked Weight Change Eyes Complaints and Symptoms: Negative for: Dry Eyes; Vision Changes; Glasses / Contacts Medical History: Negative for: Cataracts; Glaucoma; Optic Neuritis Ear/Nose/Mouth/Throat Complaints and Symptoms: Negative for: Chronic sinus problems or rhinitis Medical History: Negative for: Chronic sinus problems/congestion; Middle ear problems Respiratory Complaints and Symptoms: Negative for: Chronic or frequent coughs; Shortness of Breath Medical History: Negative for: Aspiration; Asthma; Chronic Obstructive Pulmonary Disease (COPD); Pneumothorax; Sleep Apnea; Tuberculosis Cardiovascular Complaints and Symptoms: Negative for: Chest pain Medical History: Positive for: Congestive Heart Failure; Peripheral Venous Disease Negative for: Angina; Arrhythmia; Coronary Artery Disease; Deep Vein Thrombosis; Hypertension; Hypotension; Myocardial Infarction; Peripheral Arterial Disease; Phlebitis; Vasculitis Gastrointestinal Complaints and Symptoms: Negative for: Frequent diarrhea; Nausea; Vomiting Medical History: Negative for: Cirrhosis ; Colitis; Crohns; Hepatitis A; Hepatitis B; Hepatitis  C Endocrine Complaints and Symptoms: Negative for: Heat/cold intolerance Medical History: Positive for: Type II Diabetes Negative for: Type I Diabetes Time with diabetes: 55 Treated with: Diet Blood sugar tested every day: No Genitourinary Complaints and Symptoms: Negative for: Frequent urination Medical History: Positive for: End Stage Renal Disease Past Medical History Notes: dialysis monday wedensday friday Integumentary (Skin) Complaints and Symptoms: Positive for: Wounds Medical History: Negative for: History of Burn Musculoskeletal Complaints and Symptoms: Negative for: Muscle Pain; Muscle Weakness Medical History: Negative for: Gout; Rheumatoid Arthritis; Osteoarthritis; Osteomyelitis Neurologic Complaints and Symptoms: Negative for: Numbness/parasthesias Medical History: Negative for: Dementia; Neuropathy; Quadriplegia; Paraplegia; Seizure Disorder Psychiatric Complaints and Symptoms: Negative for: Claustrophobia; Suicidal Medical History: Negative for: Anorexia/bulimia; Confinement Anxiety Hematologic/Lymphatic Medical History: Negative for: Anemia; Hemophilia; Human Immunodeficiency Virus; Lymphedema; Sickle Cell Disease Immunological Medical History: Negative for: Lupus Erythematosus; Raynauds; Scleroderma Oncologic Medical History: Negative for: Received Chemotherapy; Received Radiation Immunizations Pneumococcal Vaccine: Received Pneumococcal Vaccination: No Implantable Devices None Family and Social History Cancer: Yes - Mother,Siblings; Diabetes: Yes - Siblings,Maternal Grandparents; Heart Disease: Yes - Maternal Grandparents,Paternal Grandparents,Mother,Father,Siblings; Hereditary Spherocytosis: No; Hypertension: Yes - Maternal Grandparents,Paternal Grandparents,Mother,Father,Siblings,Child; Kidney Disease: Yes - Siblings; Lung Disease: Yes - Siblings; Seizures: No; Stroke: Yes - Siblings; Thyroid Problems: No; Tuberculosis: No; Never smoker;  Marital Status - Divorced; Alcohol Use: Never; Drug Use: No History; Caffeine Use: Rarely Electronic Signature(s) Signed: 12/11/2020 5:57:42 PM By:  Linton Ham MD Signed: 12/12/2020 5:14:06 PM By: Carlene Coria RN Entered By: Carlene Coria on 12/11/2020 13:47:49 -------------------------------------------------------------------------------- SuperBill Details Patient Name: Date of Service: [name] Macho. 12/11/2020 Medical Record Number: [id number] Patient Account Number: [id number] Date of Birth/Sex: Treating RN: [birth year] (84 y.o. Tonita Phoenix, Lauren Primary Care Provider: PLA CE, CA MDEN Other Clinician: Referring Provider: Treating Provider/Extender: Linton Ham PLA CE, CA MDEN Weeks in Treatment: 0 Diagnosis Coding ICD-10 Codes Code Description L89.151 Pressure ulcer of sacral region, stage 1 L89.102 Pressure ulcer of unspecified part of back, stage 2 Facility Procedures CPT4 Code: [id number] Description: [phone] - WOUND CARE VISIT-LEV 5 EST PT Modifier: Quantity: 1 Physician Procedures : CPT4 Code Description Modifier [id number] WC PHYS LEVEL 3 NEW PT ICD-10 Diagnosis Description L89.151 Pressure ulcer of sacral region, stage 1 L89.102 Pressure ulcer of unspecified part of back, stage 2 Quantity: 1 Electronic Signature(s) Signed: 12/11/2020 5:57:42 PM By: Linton Ham MD Entered By: Linton Ham on 12/11/2020 15:49:34

## 2020-12-12 NOTE — Progress Notes (Signed)
[name] James, [place] ([id number]) Visit Report for 12/11/2020 Allergy List Details Patient Name: Date of Service: [name] James, KJOS. 12/11/2020 1:15 PM Medical Record Number: [id number] Patient Account Number: [id number] Date of Birth/Sex: Treating RN: 01/04/1936 (84 y.o. Orvan Falconer Primary Care Bonne Whack: PLA CE, [place] MDEN Other Clinician: Referring Donnella Morford: Treating Peter Keyworth/Extender: Linton Ham PLA CE, CA MDEN Weeks in Treatment: 0 Allergies Active Allergies penicillin Type: Food Allergy Notes Electronic Signature(s) Signed: 12/12/2020 5:14:06 PM By: Carlene Coria RN Entered By: Carlene Coria on 12/11/2020 13:40:47 -------------------------------------------------------------------------------- Arrival Information Details Patient Name: Date of Service: [name] James. 12/11/2020 1:15 PM Medical Record Number: [id number] Patient Account Number: [id number] Date of Birth/Sex: Treating RN: [birth year] (84 y.o. Orvan Falconer Primary Care Delmas Faucett: PLA CE, CA MDEN Other Clinician: Referring Jamar Weatherall: Treating Arihaan Bellucci/Extender: Linton Ham PLA CE, CA MDEN Weeks in Treatment: 0 Visit Information Patient Arrived: Wheel Chair Arrival Time: 13:22 Accompanied By: daughter Transfer Assistance: Civil Service fast streamer Patient Identification Verified: Yes Secondary Verification Process Completed: Yes Patient Requires Transmission-Based Precautions: No Patient Has Alerts: No Electronic Signature(s) Signed: 12/12/2020 5:14:06 PM By: Carlene Coria RN Entered By: Carlene Coria on 12/11/2020 13:30:26 -------------------------------------------------------------------------------- Clinic Level of Care Assessment Details Patient Name: Date of Service: [name] James, [name] James 12/11/2020 1:15 PM Medical Record Number: [id number] Patient Account Number: [id number] Date of Birth/Sex: Treating RN: Feb 27, 1936 (84 y.o. Tonita Phoenix, Lauren Primary Care Anjolina Byrer: PLA CE, CA MDEN Other Clinician: Referring  Exa Bomba: Treating Clemons Salvucci/Extender: Linton Ham PLA CE, [place] MDEN Weeks in Treatment: 0 Clinic Level of Care Assessment Items TOOL 2 Quantity Score X- 1 0 Use when only an EandM is performed on the INITIAL visit ASSESSMENTS - Nursing Assessment / Reassessment X- 1 20 General Physical Exam (combine w/ comprehensive assessment (listed just below) when performed on new pt. evals) X- 1 25 Comprehensive Assessment (HX, ROS, Risk Assessments, Wounds Hx, etc.) ASSESSMENTS - Wound and Skin A ssessment / Reassessment []  - 0 Simple Wound Assessment / Reassessment - one wound X- 2 5 Complex Wound Assessment / Reassessment - multiple wounds X- 1 10 Dermatologic / Skin Assessment (not related to wound area) ASSESSMENTS - Ostomy and/or Continence Assessment and Care []  - 0 Incontinence Assessment and Management []  - 0 Ostomy Care Assessment and Management (repouching, etc.) PROCESS - Coordination of Care []  - 0 Simple Patient / Family Education for ongoing care X- 1 20 Complex (extensive) Patient / Family Education for ongoing care X- 1 10 Staff obtains Programmer, systems, Records, T Results / Process Orders est X- 1 10 Staff telephones HHA, Nursing Homes / Clarify orders / etc []  - 0 Routine Transfer to another Facility (non-emergent condition) []  - 0 Routine Hospital Admission (non-emergent condition) X- 1 15 New Admissions / Biomedical engineer / Ordering NPWT Apligraf, etc. , []  - 0 Emergency Hospital Admission (emergent condition) []  - 0 Simple Discharge Coordination X- 1 15 Complex (extensive) Discharge Coordination PROCESS - Special Needs []  - 0 Pediatric / Minor Patient Management []  - 0 Isolation Patient Management []  - 0 Hearing / Language / Visual special needs []  - 0 Assessment of Community assistance (transportation, D/C planning, etc.) []  - 0 Additional assistance / Altered mentation []  - 0 Support Surface(s) Assessment (bed, cushion, seat,  etc.) INTERVENTIONS - Wound Cleansing / Measurement X- 1 5 Wound Imaging (photographs - any number of wounds) []  - 0 Wound Tracing (instead of photographs) []  - 0 Simple Wound Measurement - one wound X- 2 5 Complex Wound Measurement - multiple wounds X- 1  5 Simple Wound Cleansing - one wound []  - 0 Complex Wound Cleansing - multiple wounds INTERVENTIONS - Wound Dressings []  - 0 Small Wound Dressing one or multiple wounds X- 2 15 Medium Wound Dressing one or multiple wounds []  - 0 Large Wound Dressing one or multiple wounds []  - 0 Application of Medications - injection INTERVENTIONS - Miscellaneous []  - 0 External ear exam []  - 0 Specimen Collection (cultures, biopsies, blood, body fluids, etc.) []  - 0 Specimen(s) / Culture(s) sent or taken to Lab for analysis X- 1 10 Patient Transfer (multiple staff / [name] Lift / Similar devices) []  - 0 Simple Staple / Suture removal (25 or less) []  - 0 Complex Staple / Suture removal (26 or more) []  - 0 Hypo / Hyperglycemic Management (close monitor of Blood Glucose) []  - 0 Ankle / Brachial Index (ABI) - do not check if billed separately Has the patient been seen at the hospital within the last three years: Yes Total Score: 195 Level Of Care: New/Established - Level 5 Electronic Signature(s) Signed: 12/12/2020 5:16:00 PM By: Rhae Hammock RN Entered By: Rhae Hammock on 12/11/2020 14:45:05 -------------------------------------------------------------------------------- Encounter Discharge Information Details Patient Name: Date of Service: [name] 12/11/2020 1:15 PM Medical Record Number: [id number] Patient Account Number: [id number] Date of Birth/Sex: Treating RN: 02/16/1936 (84 y.o. Debby Bud Primary Care Rhian Asebedo: PLA CE, CA MDEN Other Clinician: Referring Kelson Queenan: Treating Laurinda Carreno/Extender: Linton Ham PLA CE, CA MDEN Weeks in Treatment: 0 Encounter Discharge Information Items Discharge  Condition: Stable Ambulatory Status: Wheelchair Discharge Destination: Skilled Nursing Facility Telephoned: No Orders Sent: Yes Transportation: Private Auto Accompanied By: daughter Schedule Follow-up Appointment: Yes Clinical Summary of Care: Electronic Signature(s) Signed: 12/11/2020 5:55:39 PM By: Deon Pilling Entered By: Deon Pilling on 12/11/2020 17:05:41 -------------------------------------------------------------------------------- Multi Wound Chart Details Patient Name: Date of Service: [name] James. 12/11/2020 1:15 PM Medical Record Number: [id number] Patient Account Number: [id number] Date of Birth/Sex: Treating RN: 1936/01/05 (84 y.o. Tonita Phoenix, Lauren Primary Care Keeven Matty: PLA CE, CA MDEN Other Clinician: Referring Shawnise Peterkin: Treating Paislea Hatton/Extender: Linton Ham PLA CE, CA MDEN Weeks in Treatment: 0 Vital Signs Height(in): 61 Pulse(bpm): 74 Weight(lbs): 113 Blood Pressure(mmHg): 147/67 Body Mass Index(BMI): 21 Temperature(F): 97. Respiratory Rate(breaths/min): 18 Photos: [1:No Photos Left, Distal Back] [2:No Photos Coccyx] [N/A:N/A N/A] Wound Location: [1:Gradually Appeared] [2:Gradually Appeared] [N/A:N/A] Wounding Event: [1:Pressure Ulcer] [2:Pressure Ulcer] [N/A:N/A] Primary Etiology: [1:Congestive Heart Failure, Peripheral Congestive Heart Failure, Peripheral] [N/A:N/A] Comorbid History: [1:Venous Disease, Type II Diabetes, End Stage Renal Disease 05/27/2020] [2:Venous Disease, Type II Diabetes, End Stage Renal Disease 05/25/2020] [N/A:N/A] Date Acquired: [1:0] [2:0] [N/A:N/A] Weeks of Treatment: [1:Open] [2:Open] [N/A:N/A] Wound Status: [1:2.7x2.3x0.1] [2:0.5x1x0.1] [N/A:N/A] Measurements L x W x D (cm) [1:4.877] [2:0.393] [N/A:N/A] A (cm) : rea [1:0.488] [2:0.039] [N/A:N/A] Volume (cm) : [1:0.00%] [2:-4812.50%] [N/A:N/A] % Reduction in A rea: [1:0.00%] [2:-3800.00%] [N/A:N/A] % Reduction in Volume: [1:Category/Stage II] [2:Category/Stage  II] [N/A:N/A] Classification: [1:Medium] [2:Medium] [N/A:N/A] Exudate A mount: [1:Serosanguineous] [2:Serosanguineous] [N/A:N/A] Exudate Type: [1:red, brown] [2:red, brown] [N/A:N/A] Exudate Color: [1:Distinct, outline attached] [2:Distinct, outline attached] [N/A:N/A] Wound Margin: [1:Large (67-100%)] [2:Large (67-100%)] [N/A:N/A] Granulation A mount: [1:Red, Pink] [2:Red, Pink] [N/A:N/A] Granulation Quality: [1:None Present (0%)] [2:None Present (0%)] [N/A:N/A] Necrotic A mount: [1:Fat Layer (Subcutaneous Tissue): Yes Fascia: No] [N/A:N/A] Exposed Structures: [1:Fascia: No Tendon: No Muscle: No Joint: No Bone: No Medium (34-66%)] [2:Fat Layer (Subcutaneous Tissue): No Tendon: No Muscle: No Joint: No Bone: No Limited to Skin Breakdown Medium (34-66%)] [N/A:N/A] Treatment Notes Electronic Signature(s) Signed: 12/11/2020  5:57:42 PM By: Linton Ham MD Signed: 12/12/2020 5:16:00 PM By: Rhae Hammock RN Entered By: Linton Ham on 12/11/2020 15:18:26 -------------------------------------------------------------------------------- [place] Details Patient Name: Date of Service: [name] James, [name] James. 12/11/2020 1:15 PM Medical Record Number: [id number] Patient Account Number: [id number] Date of Birth/Sex: Treating RN: 1936/11/25 (84 y.o. Tonita Phoenix, Lauren Primary Care Debbie Yearick: PLA CE, CA MDEN Other Clinician: Referring Nikkole Placzek: Treating Trayvion Embleton/Extender: Linton Ham PLA CE, CA MDEN Weeks in Treatment: 0 Active Inactive Orientation to the Wound Care Program Nursing Diagnoses: Knowledge deficit related to the wound healing center program Goals: Patient/caregiver will verbalize understanding of the [place] Date Initiated: 12/11/2020 Target Resolution Date: 12/28/2020 Goal Status: Active Interventions: Provide education on orientation to the wound center Notes: Wound/Skin Impairment Nursing Diagnoses: Impaired tissue  integrity Knowledge deficit related to ulceration/compromised skin integrity Goals: Patient will have a decrease in wound volume by X% from date: (specify in notes) Date Initiated: 12/11/2020 Target Resolution Date: 12/28/2020 Goal Status: Active Patient/caregiver will verbalize understanding of skin care regimen Date Initiated: 12/11/2020 Target Resolution Date: 12/28/2020 Goal Status: Active Ulcer/skin breakdown will have a volume reduction of 30% by week 4 Date Initiated: 12/11/2020 Target Resolution Date: 12/28/2020 Goal Status: Active Interventions: Assess patient/caregiver ability to obtain necessary supplies Assess patient/caregiver ability to perform ulcer/skin care regimen upon admission and as needed Assess ulceration(s) every visit Notes: Electronic Signature(s) Signed: 12/12/2020 5:16:00 PM By: Rhae Hammock RN Entered By: Rhae Hammock on 12/11/2020 14:27:33 -------------------------------------------------------------------------------- Pain Assessment Details Patient Name: Date of Service: [name] James. 12/11/2020 1:15 PM Medical Record Number: [id number] Patient Account Number: [id number] Date of Birth/Sex: Treating RN: April 15, 1936 (84 y.o. Orvan Falconer Primary Care Jarry Manon: PLA CE, CA MDEN Other Clinician: Referring Mirca Yale: Treating Gitty Osterlund/Extender: Linton Ham PLA CE, CA MDEN Weeks in Treatment: 0 Active Problems Location of Pain Severity and Description of Pain Patient Has Paino No Site Locations Pain Management and Medication Current Pain Management: Electronic Signature(s) Signed: 12/12/2020 5:14:06 PM By: Carlene Coria RN Entered By: Carlene Coria on 12/11/2020 14:06:01 -------------------------------------------------------------------------------- Patient/Caregiver Education Details Patient Name: Date of Service: [name] James 12/21/2021andnbsp1:15 PM Medical Record Number: [id number] Patient Account Number:  [id number] Date of Birth/Gender: Treating RN: 05/25/1936 (84 y.o. [name] James Primary Care Physician: PLA CE, CA MDEN Other Clinician: Referring Physician: Treating Physician/Extender: Linton Ham PLA CE, CA MDEN Weeks in Treatment: 0 Education Assessment Education Provided To: Patient Education Topics Provided Welcome T [facility] [place]: o Methods: Explain/Verbal Responses: State content correctly Electronic Signature(s) Signed: 12/12/2020 5:16:00 PM By: Rhae Hammock RN Entered By: Rhae Hammock on 12/11/2020 14:27:43 -------------------------------------------------------------------------------- Wound Assessment Details Patient Name: Date of Service: [name] James. 12/11/2020 1:15 PM Medical Record Number: [id number] Patient Account Number: [id number] Date of Birth/Sex: Treating RN: 1936-06-12 (84 y.o. Tonita Phoenix, Lauren Primary Care Ashya Nicolaisen: PLA CE, CA MDEN Other Clinician: Referring Taniyah Ballow: Treating Novaleigh Kohlman/Extender: Linton Ham PLA CE, CA MDEN Weeks in Treatment: 0 Wound Status Wound Number: 1 Primary Pressure Ulcer Etiology: Wound Location: Left, Distal Back Wound Open Wounding Event: Gradually Appeared Status: Date Acquired: 05/27/2020 Comorbid Congestive Heart Failure, Peripheral Venous Disease, Type II Weeks Of Treatment: 0 History: Diabetes, End Stage Renal Disease Clustered Wound: No Wound Measurements Length: (cm) 2.7 Width: (cm) 2.3 Depth: (cm) 0.1 Area: (cm) 4.877 Volume: (cm) 0.488 Wound Description Classification: Category/Stage II Wound Margin: Distinct, outline attached Exudate Amount: Medium Exudate Type: Serosanguineous Exudate Color: red, brown Foul Odor After Cleansing: Slough/Fibrino % Reduction in Area: 0% %  Reduction in Volume: 0% Epithelialization: Medium (34-66%) Tunneling: No Undermining: No No No Wound Bed Granulation Amount: Large (67-100%) Exposed Structure Granulation Quality:  Red, Pink Fascia Exposed: No Necrotic Amount: None Present (0%) Fat Layer (Subcutaneous Tissue) Exposed: Yes Tendon Exposed: No Muscle Exposed: No Joint Exposed: No Bone Exposed: No Treatment Notes Wound #1 (Back) Wound Laterality: Left, Distal Cleanser Soap and Water Discharge Instruction: May shower and wash wound with dial antibacterial soap and water prior to dressing change. Wound Cleanser Discharge Instruction: Cleanse the wound with wound cleanser prior to applying a clean dressing using gauze sponges, not tissue or cotton balls. Peri-Wound Care Skin Prep Discharge Instruction: Use skin prep as directed Topical Primary Dressing PolyMem Non-Adhesive Dressing, 4x4 in Discharge Instruction: Apply to wound bed as instructed Secondary Dressing Optifoam Non-Adhesive Dressing, 4x4 in Discharge Instruction: Apply over primary dressing as directed. Secured With Compression Wrap Compression Stockings Environmental education officer) Signed: 12/12/2020 5:16:00 PM By: Rhae Hammock RN Entered By: Rhae Hammock on 12/11/2020 14:38:24 -------------------------------------------------------------------------------- Wound Assessment Details Patient Name: Date of Service: [name] James. 12/11/2020 1:15 PM Medical Record Number: [id number] Patient Account Number: [id number] Date of Birth/Sex: Treating RN: March 12, 1936 (84 y.o. Tonita Phoenix, Lauren Primary Care Ruhani Umland: PLA CE, CA MDEN Other Clinician: Referring Cyani Kallstrom: Treating Shareena Nusz/Extender: Linton Ham PLA CE, CA MDEN Weeks in Treatment: 0 Wound Status Wound Number: 2 Primary Pressure Ulcer Etiology: Wound Location: Coccyx Wound Open Wounding Event: Gradually Appeared Status: Date Acquired: 05/25/2020 Comorbid Congestive Heart Failure, Peripheral Venous Disease, Type II Weeks Of Treatment: 0 Weeks Of Treatment: 0 History: Diabetes, End Stage Renal Disease Clustered Wound: No Wound  Measurements Length: (cm) 0.5 Width: (cm) 1 Depth: (cm) 0.1 Area: (cm) 0.393 Volume: (cm) 0.039 % Reduction in Area: -4812.5% % Reduction in Volume: -3800% Epithelialization: Medium (34-66%) Tunneling: No Undermining: No Wound Description Classification: Category/Stage II Wound Margin: Distinct, outline attached Exudate Amount: Medium Exudate Type: Serosanguineous Exudate Color: red, brown Foul Odor After Cleansing: No Slough/Fibrino No Wound Bed Granulation Amount: Large (67-100%) Exposed Structure Granulation Quality: Red, Pink Fascia Exposed: No Necrotic Amount: None Present (0%) Fat Layer (Subcutaneous Tissue) Exposed: No Tendon Exposed: No Muscle Exposed: No Joint Exposed: No Bone Exposed: No Limited to Skin Breakdown Treatment Notes Wound #2 (Coccyx) Cleanser Soap and Water Discharge Instruction: May shower and wash wound with dial antibacterial soap and water prior to dressing change. Wound Cleanser Discharge Instruction: Cleanse the wound with wound cleanser prior to applying a clean dressing using gauze sponges, not tissue or cotton balls. Peri-Wound Care Zinc Oxide Ointment 30g tube Discharge Instruction: Apply Zinc Oxide to periwound with each dressing change Topical Primary Dressing Secondary Dressing Secured With Compression Wrap Compression Stockings Add-Ons Electronic Signature(s) Signed: 12/12/2020 5:16:00 PM By: Rhae Hammock RN Entered By: Rhae Hammock on 12/11/2020 14:36:27 -------------------------------------------------------------------------------- Vitals Details Patient Name: Date of Service: [name] James. 12/11/2020 1:15 PM Medical Record Number: [id number] Patient Account Number: [id number] Date of Birth/Sex: Treating RN: 1936/11/06 (84 y.o. Orvan Falconer Primary Care Javonte Elenes: PLA CE, CA MDEN Other Clinician: Referring Kuper Rennels: Treating Izac Faulkenberry/Extender: Linton Ham PLA CE, CA MDEN Weeks in Treatment:  0 Vital Signs Time Taken: 13:30 Temperature (F): 97. Height (in): 61 Pulse (bpm): 74 Source: Stated Respiratory Rate (breaths/min): 18 Weight (lbs): 113 Blood Pressure (mmHg): 147/67 Source: Stated Reference Range: 80 - 120 mg / dl Body Mass Index (BMI): 21.3 Electronic Signature(s) Signed: 12/12/2020 5:14:06 PM By: Carlene Coria RN Entered By: Carlene Coria on 12/11/2020 13:40:21

## 2021-01-01 ENCOUNTER — Encounter (HOSPITAL_BASED_OUTPATIENT_CLINIC_OR_DEPARTMENT_OTHER): Payer: Medicare Other | Attending: Internal Medicine | Admitting: Internal Medicine

## 2021-01-01 ENCOUNTER — Other Ambulatory Visit: Payer: Self-pay

## 2021-01-01 DIAGNOSIS — I5032 Chronic diastolic (congestive) heart failure: Secondary | ICD-10-CM | POA: Diagnosis not present

## 2021-01-01 DIAGNOSIS — L89102 Pressure ulcer of unspecified part of back, stage 2: Secondary | ICD-10-CM | POA: Insufficient documentation

## 2021-01-01 DIAGNOSIS — L89151 Pressure ulcer of sacral region, stage 1: Secondary | ICD-10-CM | POA: Insufficient documentation

## 2021-01-01 DIAGNOSIS — N189 Chronic kidney disease, unspecified: Secondary | ICD-10-CM | POA: Diagnosis not present

## 2021-01-01 DIAGNOSIS — I10 Essential (primary) hypertension: Secondary | ICD-10-CM | POA: Insufficient documentation

## 2021-01-01 DIAGNOSIS — Z992 Dependence on renal dialysis: Secondary | ICD-10-CM | POA: Diagnosis not present

## 2021-01-01 NOTE — Progress Notes (Signed)
[name] James, [name] James ([id number]) Visit Report for 01/01/2021 HPI Details Patient Name: Date of Service: [name] James, [name] James. 01/01/2021 10:45 A M Medical Record Number: [id number] Patient Account Number: [id number] Date of Birth/Sex: Treating RN: 07-19-1936 (85 y.o. [name] Primary Care Provider: PLA CE, CA MDEN Other Clinician: Referring Provider: Treating Provider/Extender: Linton Ham PLA CE, CA MDEN Weeks in Treatment: 3 History of Present Illness HPI Description: ADMISSION 12/11/2020 This is an 85 year old woman who is a permanent resident of [facility] skilled facility. She has been there for 6 years. She is here for review of advertise pressure areas on her right lower back and also her coccyx and surrounding bilateral buttocks. They have been there for several months. Her daughter who is present says that the patient had a fall off a [employer] lift earlier this year and she wonders whether that it something to do with this however I cannot find anything that would support that assumption. There is some suggesting that they have been using Santyl on these wounds although I did not have any documentation from the facility. Has a stage II wound on the left lower back. Nothing really open on the coccyx however the skin is excoriated flaking. Past medical history includes atrial fibrillation/sick sinus syndrome, chronic renal failure which the daughter says is secondary to diabetes although I did not see this on her problem list, diastolic heart failure, remote bilateral mastectomies, history of low back surgery, hypertension The patient is nonambulatory. I am really not particularly sure why. She needs [employer] lift transfers. Of course she has to sit at dialysis I cannot imagine it is easy for her to reposition herself. 01/01/2021; patient at [facility] skilled facility. She had a presumably pressure wound on the left lateral back. This is just about closed markedly  improved dimensions. She has irritated dry flaking erythematous skin over her coccyx but no open wound some of this is probably pressure related [stage I] some of it related to maceration and friction. She has had dialysis which adds to the problem with wounds in this area Electronic Signature(s) Signed: 01/01/2021 5:07:05 PM By: Linton Ham MD Entered By: Linton Ham on 01/01/2021 13:07:55 -------------------------------------------------------------------------------- Physical Exam Details Patient Name: Date of Service: [name] Balzarine M. 01/01/2021 10:45 A M Medical Record Number: [id number] Patient Account Number: [id number] Date of Birth/Sex: Treating RN: Feb 18, 1936 (85 y.o. Tonita Phoenix, Lauren Primary Care Provider: PLA CE, CA MDEN Other Clinician: Referring Provider: Treating Provider/Extender: Linton Ham PLA CE, CA MDEN Weeks in Treatment: 3 Notes Wound exam The wound on her left lower back has still a small open area but most of this is epithelialized. Nothing open on her coccyx however I think some of this is stage I pressure friction moisture etc. 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 01/01/2021 10:45 A M Medical Record Number: [id number] Patient Account Number: [id number] Date of Birth/Sex: Treating RN: [birth year] (85 y.o. [name] Primary Care Provider: PLA CE, CA MDEN Other Clinician: Referring Provider: Treating Provider/Extender: Linton Ham PLA CE, CA MDEN Weeks in Treatment: 3 Verbal / Phone Orders: No Diagnosis Coding Follow-up Appointments Return appointment in 3 weeks. Bathing/ Shower/ Hygiene May shower and wash wound with soap and water. - with  dressing changes only. Off-Loading Gel wheelchair cushion Turn and reposition every 2 hours Wound  Treatment Wound #1 - Back Wound Laterality: Left, Distal Cleanser: Soap and Water Every Other Day/30 Days Discharge Instructions: May shower and wash wound with dial antibacterial soap and water prior to dressing change. Cleanser: Wound Cleanser Every Other Day/30 Days Discharge Instructions: Cleanse the wound with wound cleanser prior to applying a clean dressing using gauze sponges, not tissue or cotton balls. Peri-Wound Care: Skin Prep Every Other Day/30 Days Discharge Instructions: Use skin prep as directed Prim Dressing: PolyMem Non-Adhesive Dressing, 4x4 in Every Other Day/30 Days ary Discharge Instructions: Apply to wound bed as instructed Secondary Dressing: Optifoam Non-Adhesive Dressing, 4x4 in Every Other Day/30 Days Discharge Instructions: Apply over primary dressing as directed. Wound #2 - Coccyx Cleanser: Soap and Water 1 x Per Day/30 Days Discharge Instructions: May shower and wash wound with dial antibacterial soap and water prior to dressing change. Cleanser: Wound Cleanser 1 x Per Day/30 Days Discharge Instructions: Cleanse the wound with wound cleanser prior to applying a clean dressing using gauze sponges, not tissue or cotton balls. Peri-Wound Care: Zinc Oxide Ointment 30g tube 1 x Per Day/30 Days Discharge Instructions: Apply Zinc Oxide to periwound with each dressing change Electronic Signature(s) Signed: 01/01/2021 5:07:05 PM By: Linton Ham MD Signed: 01/01/2021 5:35:07 PM By: Rhae Hammock RN Entered By: Rhae Hammock on 01/01/2021 12:04:43 -------------------------------------------------------------------------------- Problem List Details Patient Name: Date of Service: [name] Macho. 01/01/2021 10:45 A M Medical Record Number: [id number] Patient Account Number: [id number] Date of Birth/Sex: Treating RN: 01/21/1936 (85 y.o. Tonita Phoenix,  Lauren Primary Care Provider: PLA CE, CA MDEN Other Clinician: Referring Provider: Treating Provider/Extender: Linton Ham PLA CE, CA MDEN Weeks in Treatment: 3 Active Problems ICD-10 Encounter Code Description Active Date MDM Diagnosis L89.151 Pressure ulcer of sacral region, stage 1 12/11/2020 No Yes L89.102 Pressure ulcer of unspecified part of back, stage 2 12/11/2020 No Yes Inactive Problems Resolved Problems Electronic Signature(s) Signed: 01/01/2021 5:07:05 PM By: Linton Ham MD Entered By: Linton Ham on 01/01/2021 13:06:38 -------------------------------------------------------------------------------- Progress Note Details Patient Name: Date of Service: [name] Macho. 01/01/2021 10:45 A M Medical Record Number: [id number] Patient Account Number: [id number] Date of Birth/Sex: Treating RN: 1936/11/29 (85 y.o. [name] Primary Care Provider: PLA CE, CA MDEN Other Clinician: Referring Provider: Treating Provider/Extender: Linton Ham PLA CE, CA MDEN Weeks in Treatment: 3 Subjective History of Present Illness (HPI) ADMISSION 12/11/2020 This is an 85 year old woman who is a permanent resident of [facility] skilled facility. She has been there for 6 years. She is here for review of advertise pressure areas on her right lower back and also her coccyx and surrounding bilateral buttocks. They have been there for several months. Her daughter who is present says that the patient had a fall off a [employer] lift earlier this year and she wonders whether that it something to do with this however I cannot find anything that would support that assumption. There is some suggesting that they have been using Santyl on these wounds although I did not have any documentation from the facility. Has a stage II wound on the left lower back. Nothing really open on the coccyx however the skin is excoriated flaking. Past medical history includes atrial fibrillation/sick  sinus syndrome, chronic renal failure which the daughter says is secondary to diabetes although I did not see this on her problem list, diastolic heart failure, remote bilateral mastectomies, history of low back surgery, hypertension The patient is nonambulatory. I am really not particularly sure why. She needs [employer] fast streamer  transfers. Of course she has to sit at dialysis I cannot imagine it is easy for her to reposition herself. 01/01/2021; patient at [facility] skilled facility. She had a presumably pressure wound on the left lateral back. This is just about closed markedly improved dimensions. She has irritated dry flaking erythematous skin over her coccyx but no open wound some of this is probably pressure related [stage I] some of it related to maceration and friction. She has had dialysis which adds to the problem with wounds in this area Objective Constitutional Vitals Time Taken: 11:15 AM, Height: 61 in, Weight: 113 lbs, BMI: 21.3, Temperature: 97.6 F, Pulse: 79 bpm, Respiratory Rate: 18 breaths/min, Blood Pressure: 131/52 mmHg. Integumentary (Hair, Skin) Wound #1 status is Open. Original cause of wound was Gradually Appeared. The wound is located on the Left,Distal Back. The wound measures 1.1cm length x 0.7cm width x 0.1cm depth; 0.605cm^2 area and 0.06cm^3 volume. There is Fat Layer (Subcutaneous Tissue) exposed. There is no tunneling or undermining noted. There is a small amount of serous drainage noted. The wound margin is distinct with the outline attached to the wound base. There is large (67-100%) pink granulation within the wound bed. There is no necrotic tissue within the wound bed. Wound #2 status is Open. Original cause of wound was Gradually Appeared. The wound is located on the Coccyx. The wound measures 0.3cm length x 0.4cm width x 0.1cm depth; 0.094cm^2 area and 0.009cm^3 volume. The wound is limited to skin breakdown. There is no tunneling or undermining noted. There is  a small amount of serous drainage noted. The wound margin is indistinct and nonvisible. There is small (1-33%) pink granulation within the wound bed. There is no necrotic tissue within the wound bed. Assessment Active Problems ICD-10 Pressure ulcer of sacral region, stage 1 Pressure ulcer of unspecified part of back, stage 2 Plan Follow-up Appointments: Return appointment in 3 weeks. Bathing/ Shower/ Hygiene: May shower and wash wound with soap and water. - with dressing changes only. Off-Loading: Gel wheelchair cushion Turn and reposition every 2 hours WOUND #1: - Back Wound Laterality: Left, Distal Cleanser: Soap and Water Every Other Day/30 Days Discharge Instructions: May shower and wash wound with dial antibacterial soap and water prior to dressing change. Cleanser: Wound Cleanser Every Other Day/30 Days Discharge Instructions: Cleanse the wound with wound cleanser prior to applying a clean dressing using gauze sponges, not tissue or cotton balls. Peri-Wound Care: Skin Prep Every Other Day/30 Days Discharge Instructions: Use skin prep as directed Prim Dressing: PolyMem Non-Adhesive Dressing, 4x4 in Every Other Day/30 Days ary Discharge Instructions: Apply to wound bed as instructed Secondary Dressing: Optifoam Non-Adhesive Dressing, 4x4 in Every Other Day/30 Days Discharge Instructions: Apply over primary dressing as directed. WOUND #2: - Coccyx Wound Laterality: Cleanser: Soap and Water 1 x Per Day/30 Days Discharge Instructions: May shower and wash wound with dial antibacterial soap and water prior to dressing change. Cleanser: Wound Cleanser 1 x Per Day/30 Days Discharge Instructions: Cleanse the wound with wound cleanser prior to applying a clean dressing using gauze sponges, not tissue or cotton balls. Peri-Wound Care: Zinc Oxide Ointment 30g tube 1 x Per Day/30 Days Discharge Instructions: Apply Zinc Oxide to periwound with each dressing change 1. We continued with the  polymen to the left lower back wound 2. I am simply putting zinc oxide on her lower back there is no open wound here. Electronic Signature(s) Signed: 01/01/2021 5:07:05 PM By: Linton Ham MD Entered By: Linton Ham on  01/01/2021 13:09:11 -------------------------------------------------------------------------------- SuperBill Details Patient Name: Date of Service: [name] James, [name] James. 01/01/2021 Medical Record Number: [id number] Patient Account Number: [id number] Date of Birth/Sex: Treating RN: 07-Nov-1936 (85 y.o. Tonita Phoenix, Lauren Primary Care Provider: PLA CE, CA MDEN Other Clinician: Referring Provider: Treating Provider/Extender: Linton Ham PLA CE, CA MDEN Weeks in Treatment: 3 Diagnosis Coding ICD-10 Codes Code Description L89.151 Pressure ulcer of sacral region, stage 1 L89.102 Pressure ulcer of unspecified part of back, stage 2 Facility Procedures CPT4 Code: [id number] Description: 99214 - WOUND CARE VISIT-LEV 4 EST PT Modifier: Quantity: 1 Physician Procedures : CPT4 Code Description Modifier [id number] - WC PHYS LEVEL 2 - EST PT ICD-10 Diagnosis Description L89.151 Pressure ulcer of sacral region, stage 1 L89.102 Pressure ulcer of unspecified part of back, stage 2 Quantity: 1 Electronic Signature(s) Signed: 01/01/2021 5:07:05 PM By: Linton Ham MD Entered By: Linton Ham on 01/01/2021 13:09:33

## 2021-01-01 NOTE — Progress Notes (Signed)
MOMO, BRAUN ([id number]) Visit Report for 01/01/2021 Arrival Information Details Patient Name: Date of Service: [name] James, [name] 01/01/2021 10:45 A M Medical Record Number: [id number] Patient Account Number: [id number] Date of Birth/Sex: Treating RN: 05-11-1936 (85 y.o. [name] James Primary Care [name] James: PLA CE, CA MDEN Other Clinician: Referring Gleen Ripberger: Treating Yaslin Kirtley/Extender: Linton Ham PLA CE, CA MDEN Weeks in Treatment: 3 Visit Information History Since Last Visit Added or deleted any medications: No Patient Arrived: Wheel Chair Any new allergies or adverse reactions: No Arrival Time: 11:12 Had a fall or experienced change in No Accompanied By: self activities of daily living that may affect Transfer Assistance: None risk of falls: Patient Identification Verified: Yes Signs or symptoms of abuse/neglect since last visito No Secondary Verification Process Completed: Yes Hospitalized since last visit: No Patient Requires Transmission-Based Precautions: No Implantable device outside of the clinic excluding No Patient Has Alerts: No cellular tissue based products placed in the center since last visit: Has Dressing in Place as Prescribed: Yes Pain Present Now: No Electronic Signature(s) Signed: 01/01/2021 6:04:09 PM By: Baruch Gouty RN, BSN Entered By: Baruch Gouty on 01/01/2021 11:12:59 -------------------------------------------------------------------------------- Clinic Level of Care Assessment Details Patient Name: Date of Service: [name] James. 01/01/2021 10:45 A M Medical Record Number: [id number] Patient Account Number: [id number] Date of Birth/Sex: Treating RN: 11/07/1936 (85 y.o. [name] James, [name] James Primary Care Dena Esperanza: PLA CE, CA MDEN Other Clinician: Referring Mercedies Ganesh: Treating Ivi Griffith/Extender: Linton Ham PLA CE, CA MDEN Weeks in Treatment: 3 Clinic Level of Care Assessment Items TOOL 4 Quantity Score X- 1 0 Use when  only an EandM is performed on FOLLOW-UP visit ASSESSMENTS - Nursing Assessment / Reassessment X- 1 10 Reassessment of [name]-morbidities (includes updates in patient status) X- 1 5 Reassessment of Adherence to Treatment Plan ASSESSMENTS - Wound and Skin A ssessment / Reassessment X - Simple Wound Assessment / Reassessment - one wound 1 5 []  - 0 Complex Wound Assessment / Reassessment - multiple wounds X- 1 10 Dermatologic / Skin Assessment (not related to wound area) ASSESSMENTS - Focused Assessment []  - 0 Circumferential Edema Measurements - multi extremities X- 1 10 Nutritional Assessment / Counseling / Intervention []  - 0 Lower Extremity Assessment (monofilament, tuning fork, pulses) []  - 0 Peripheral Arterial Disease Assessment (using hand held doppler) ASSESSMENTS - Ostomy and/or Continence Assessment and Care []  - 0 Incontinence Assessment and Management []  - 0 Ostomy Care Assessment and Management (repouching, etc.) PROCESS - Coordination of Care X - Simple Patient / Family Education for ongoing care 1 15 []  - 0 Complex (extensive) Patient / Family Education for ongoing care X- 1 10 Staff obtains Programmer, systems, Records, T Results / Process Orders est X- 1 10 Staff telephones HHA, Nursing Homes / Clarify orders / etc []  - 0 Routine Transfer to another Facility (non-emergent condition) []  - 0 Routine Hospital Admission (non-emergent condition) []  - 0 New Admissions / Biomedical engineer / Ordering NPWT Apligraf, etc. , []  - 0 Emergency Hospital Admission (emergent condition) X- 1 10 Simple Discharge Coordination []  - 0 Complex (extensive) Discharge Coordination PROCESS - Special Needs []  - 0 Pediatric / Minor Patient Management []  - 0 Isolation Patient Management []  - 0 Hearing / Language / Visual special needs []  - 0 Assessment of Community assistance (transportation, D/C planning, etc.) []  - 0 Additional assistance / Altered mentation []  - 0 Support  Surface(s) Assessment (bed, cushion, seat, etc.) INTERVENTIONS - Wound Cleansing / Measurement []  - 0 Simple Wound Cleansing - one  wound X- 2 5 Complex Wound Cleansing - multiple wounds X- 1 5 Wound Imaging (photographs - any number of wounds) []  - 0 Wound Tracing (instead of photographs) []  - 0 Simple Wound Measurement - one wound X- 2 5 Complex Wound Measurement - multiple wounds INTERVENTIONS - Wound Dressings []  - 0 Small Wound Dressing one or multiple wounds X- 2 15 Medium Wound Dressing one or multiple wounds []  - 0 Large Wound Dressing one or multiple wounds X- 1 5 Application of Medications - topical []  - 0 Application of Medications - injection INTERVENTIONS - Miscellaneous []  - 0 External ear exam []  - 0 Specimen Collection (cultures, biopsies, blood, body fluids, etc.) []  - 0 Specimen(s) / Culture(s) sent or taken to Lab for analysis []  - 0 Patient Transfer (multiple staff / Civil Service fast streamer / Similar devices) []  - 0 Simple Staple / Suture removal (25 or less) []  - 0 Complex Staple / Suture removal (26 or more) []  - 0 Hypo / Hyperglycemic Management (close monitor of Blood Glucose) []  - 0 Ankle / Brachial Index (ABI) - do not check if billed separately X- 1 5 Vital Signs Has the patient been seen at the hospital within the last three years: Yes Total Score: 150 Level Of Care: New/Established - Level 4 Electronic Signature(s) Signed: 01/01/2021 5:35:07 PM By: Rhae Hammock RN Entered By: Rhae Hammock on 01/01/2021 12:09:04 -------------------------------------------------------------------------------- Encounter Discharge Information Details Patient Name: Date of Service: [name] Balzarine M. 01/01/2021 10:45 A M Medical Record Number: [id number] Patient Account Number: [id number] Date of Birth/Sex: Treating RN: 1936-04-02 (85 y.o. [name] James, [name] James Primary Care Marizol Borror: PLA CE, CA MDEN Other Clinician: Referring Jordin Dambrosio: Treating  Reshunda Strider/Extender: Linton Ham PLA CE, CA MDEN Weeks in Treatment: 3 Encounter Discharge Information Items Discharge Condition: Stable Ambulatory Status: Wheelchair Discharge Destination: Skilled Nursing Facility Telephoned: No Orders Sent: Yes Transportation: Other Accompanied By: self Schedule Follow-up Appointment: Yes Clinical Summary of Care: Patient Declined Electronic Signature(s) Signed: 01/01/2021 5:35:07 PM By: Rhae Hammock RN Entered By: Rhae Hammock on 01/01/2021 12:10:13 -------------------------------------------------------------------------------- Lower Extremity Assessment Details Patient Name: Date of Service: [name] James. 01/01/2021 10:45 A M Medical Record Number: [id number] Patient Account Number: [id number] Date of Birth/Sex: Treating RN: May 18, 1936 (85 y.o. [name] James Primary Care Dalisa Forrer: PLA CE, CA MDEN Other Clinician: Referring Tovia Kisner: Treating Catelin Manthe/Extender: Linton Ham PLA CE, CA MDEN Weeks in Treatment: 3 Electronic Signature(s) Signed: 01/01/2021 6:04:09 PM By: Baruch Gouty RN, BSN Entered By: Baruch Gouty on 01/01/2021 11:13:46 -------------------------------------------------------------------------------- Multi Wound Chart Details Patient Name: Date of Service: [name] James. 01/01/2021 10:45 A M Medical Record Number: [id number] Patient Account Number: [id number] Date of Birth/Sex: Treating RN: [birth year] (85 y.o. [name] James, [name] James Primary Care Oria Klimas: PLA CE, CA MDEN Other Clinician: Referring Linsey Arteaga: Treating Lajuan Kovaleski/Extender: Linton Ham PLA CE, CA MDEN Weeks in Treatment: 3 Vital Signs Height(in): 61 Pulse(bpm): 79 Weight(lbs): 113 Blood Pressure(mmHg): 131/52 Body Mass Index(BMI): 21 Temperature(F): 97.6 Respiratory Rate(breaths/min): 18 Photos: [1:No Photos Left, Distal Back] [2:No Photos Coccyx] [N/A:N/A N/A] Wound Location: [1:Gradually Appeared] [2:Gradually  Appeared] [N/A:N/A] Wounding Event: [1:Pressure Ulcer] [2:Pressure Ulcer] [N/A:N/A] Primary Etiology: [1:Congestive Heart Failure, Peripheral Congestive Heart Failure, Peripheral] [N/A:N/A] Comorbid History: [1:Venous Disease, Type II Diabetes, End Stage Renal Disease 05/27/2020] [2:Venous Disease, Type II Diabetes, End Stage Renal Disease 05/25/2020] [N/A:N/A] Date Acquired: [1:3] [2:3] [N/A:N/A] Weeks of Treatment: [1:Open] [2:Open] [N/A:N/A] Wound Status: [1:1.1x0.7x0.1] [2:0.3x0.4x0.1] [N/A:N/A] Measurements L x W x D (cm) [1:0.605] [2:0.094] [N/A:N/A] A (cm) : rea [1:0.06] [  2:0.009] [N/A:N/A] Volume (cm) : [1:87.60%] [2:76.10%] [N/A:N/A] % Reduction in A rea: [1:87.70%] [2:76.90%] [N/A:N/A] % Reduction in Volume: [1:Category/Stage II] [2:Category/Stage II] [N/A:N/A] Classification: [1:Small] [2:Small] [N/A:N/A] Exudate A mount: [1:Serous] [2:Serous] [N/A:N/A] Exudate Type: [1:amber] [2:amber] [N/A:N/A] Exudate Color: [1:Distinct, outline attached] [2:Indistinct, nonvisible] [N/A:N/A] Wound Margin: [1:Large (67-100%)] [2:Small (1-33%)] [N/A:N/A] Granulation A mount: [1:Pink] [2:Pink] [N/A:N/A] Granulation Quality: [1:None Present (0%)] [2:None Present (0%)] [N/A:N/A] Necrotic A mount: [1:Fat Layer (Subcutaneous Tissue): Yes Fascia: No] [N/A:N/A] Exposed Structures: [1:Fascia: No Tendon: No Muscle: No Joint: No Bone: No Large (67-100%)] [2:Fat Layer (Subcutaneous Tissue): No Tendon: No Muscle: No Joint: No Bone: No Limited to Skin Breakdown Large (67-100%)] [N/A:N/A] Treatment Notes Wound #1 (Back) Wound Laterality: Left, Distal Cleanser Soap and Water Discharge Instruction: May shower and wash wound with dial antibacterial soap and water prior to dressing change. Wound Cleanser Discharge Instruction: Cleanse the wound with wound cleanser prior to applying a clean dressing using gauze sponges, not tissue or cotton balls. Peri-Wound Care Skin Prep Discharge Instruction: Use skin prep  as directed Topical Primary Dressing PolyMem Non-Adhesive Dressing, 4x4 in Discharge Instruction: Apply to wound bed as instructed Secondary Dressing Optifoam Non-Adhesive Dressing, 4x4 in Discharge Instruction: Apply over primary dressing as directed. Secured With Compression Wrap Compression Stockings Add-Ons Wound #2 (Coccyx) Cleanser Soap and Water Discharge Instruction: May shower and wash wound with dial antibacterial soap and water prior to dressing change. Wound Cleanser Discharge Instruction: Cleanse the wound with wound cleanser prior to applying a clean dressing using gauze sponges, not tissue or cotton balls. Peri-Wound Care Zinc Oxide Ointment 30g tube Discharge Instruction: Apply Zinc Oxide to periwound with each dressing change Topical Primary Dressing Secondary Dressing Secured With Compression Wrap Compression Stockings Add-Ons Electronic Signature(s) Signed: 01/01/2021 5:07:05 PM By: Linton Ham MD Signed: 01/01/2021 5:35:07 PM By: Rhae Hammock RN Entered By: Linton Ham on 01/01/2021 13:06:48 -------------------------------------------------------------------------------- Multi-Disciplinary Care Plan Details Patient Name: Date of Service: [name] Balzarine M. 01/01/2021 10:45 A M Medical Record Number: [id number] Patient Account Number: [id number] Date of Birth/Sex: Treating RN: 1936-01-13 (85 y.o. [name] James, [name] James Primary Care Dhilan Brauer: PLA CE, CA MDEN Other Clinician: Referring Shailey Butterbaugh: Treating Jacqueline Spofford/Extender: Linton Ham PLA CE, CA MDEN Weeks in Treatment: 3 Active Inactive Wound/Skin Impairment Nursing Diagnoses: Impaired tissue integrity Knowledge deficit related to ulceration/compromised skin integrity Goals: Patient will have a decrease in wound volume by X% from date: (specify in notes) Date Initiated: 12/11/2020 Target Resolution Date: 12/28/2020 Goal Status: Active Patient/caregiver will verbalize understanding of  skin care regimen Date Initiated: 12/11/2020 Target Resolution Date: 12/28/2020 Goal Status: Active Ulcer/skin breakdown will have a volume reduction of 30% by week 4 Date Initiated: 12/11/2020 Target Resolution Date: 12/28/2020 Goal Status: Active Interventions: Assess patient/caregiver ability to obtain necessary supplies Assess patient/caregiver ability to perform ulcer/skin care regimen upon admission and as needed Assess ulceration(s) every visit Notes: Electronic Signature(s) Signed: 01/01/2021 5:35:07 PM By: Rhae Hammock RN Entered By: Rhae Hammock on 01/01/2021 12:05:11 -------------------------------------------------------------------------------- Pain Assessment Details Patient Name: Date of Service: [name] James. 01/01/2021 10:45 A M Medical Record Number: [id number] Patient Account Number: [id number] Date of Birth/Sex: Treating RN: 09-Dec-1936 (85 y.o. [name] James Primary Care Elvie Palomo: PLA CE, CA MDEN Other Clinician: Referring Fredrico Beedle: Treating Carlyn Lemke/Extender: Linton Ham PLA CE, CA MDEN Weeks in Treatment: 3 Active Problems Location of Pain Severity and Description of Pain Patient Has Paino No Site Locations Rate the pain. Current Pain Level: 0 Pain Management and Medication Current Pain Management: Electronic Signature(s) Signed:  01/01/2021 6:04:09 PM By: Baruch Gouty RN, BSN Entered By: Baruch Gouty on 01/01/2021 11:13:39 -------------------------------------------------------------------------------- Patient/Caregiver Education Details Patient Name: Date of Service: [name] James 1/11/2022andnbsp10:45 A M Medical Record Number: [id number] Patient Account Number: [id number] Date of Birth/Gender: Treating RN: 05-Jan-1936 (85 y.o. [name] Primary Care Physician: PLA CE, CA MDEN Other Clinician: Referring Physician: Treating Physician/Extender: Linton Ham PLA CE, CA MDEN Weeks in Treatment:  3 Education Assessment Education Provided To: Patient Education Topics Provided Wound/Skin Impairment: Methods: Explain/Verbal Responses: State content correctly Electronic Signature(s) Signed: 01/01/2021 5:35:07 PM By: Rhae Hammock RN Entered By: Rhae Hammock on 01/01/2021 10:59:25 -------------------------------------------------------------------------------- Wound Assessment Details Patient Name: Date of Service: [name] James. 01/01/2021 10:45 A M Medical Record Number: [id number] Patient Account Number: [id number] Date of Birth/Sex: Treating RN: 1936/09/27 (85 y.o. [name] James Primary Care Matix Henshaw: PLA CE, CA MDEN Other Clinician: Referring Charels Stambaugh: Treating Christinia Lambeth/Extender: Linton Ham PLA CE, CA MDEN Weeks in Treatment: 3 Wound Status Wound Number: 1 Primary Pressure Ulcer Etiology: Wound Location: Left, Distal Back Wound Open Wounding Event: Gradually Appeared Status: Date Acquired: 05/27/2020 Comorbid Congestive Heart Failure, Peripheral Venous Disease, Type II Weeks Of Treatment: 3 History: Diabetes, End Stage Renal Disease Clustered Wound: No Wound Measurements Length: (cm) 1.1 Width: (cm) 0.7 Depth: (cm) 0.1 Area: (cm) 0.605 Volume: (cm) 0.06 % Reduction in Area: 87.6% % Reduction in Volume: 87.7% Epithelialization: Large (67-100%) Tunneling: No Undermining: No Wound Description Classification: Category/Stage II Wound Margin: Distinct, outline attached Exudate Amount: Small Exudate Type: Serous Exudate Color: amber Foul Odor After Cleansing: No Slough/Fibrino No Wound Bed Granulation Amount: Large (67-100%) Exposed Structure Granulation Quality: Pink Fascia Exposed: No Necrotic Amount: None Present (0%) Fat Layer (Subcutaneous Tissue) Exposed: Yes Tendon Exposed: No Muscle Exposed: No Joint Exposed: No Bone Exposed: No Treatment Notes Wound #1 (Back) Wound Laterality: Left, Distal Cleanser Soap and  Water Discharge Instruction: May shower and wash wound with dial antibacterial soap and water prior to dressing change. Wound Cleanser Discharge Instruction: Cleanse the wound with wound cleanser prior to applying a clean dressing using gauze sponges, not tissue or cotton balls. Peri-Wound Care Skin Prep Discharge Instruction: Use skin prep as directed Topical Primary Dressing PolyMem Non-Adhesive Dressing, 4x4 in Discharge Instruction: Apply to wound bed as instructed Secondary Dressing Optifoam Non-Adhesive Dressing, 4x4 in Discharge Instruction: Apply over primary dressing as directed. Secured With Compression Wrap Compression Stockings Environmental education officer) Signed: 01/01/2021 6:04:09 PM By: Baruch Gouty RN, BSN Entered By: Baruch Gouty on 01/01/2021 11:14:40 -------------------------------------------------------------------------------- Wound Assessment Details Patient Name: Date of Service: [name] James. 01/01/2021 10:45 A M Medical Record Number: [id number] Patient Account Number: [id number] Date of Birth/Sex: Treating RN: March 25, 1936 (85 y.o. [name] James Primary Care Falisha Osment: PLA CE, CA MDEN Other Clinician: Referring Arva Slaugh: Treating Brennen Camper/Extender: Linton Ham PLA CE, CA MDEN Weeks in Treatment: 3 Wound Status Wound Number: 2 Primary Pressure Ulcer Etiology: Wound Location: Coccyx Wound Open Wounding Event: Gradually Appeared Status: Date Acquired: 05/25/2020 Comorbid Congestive Heart Failure, Peripheral Venous Disease, Type II Weeks Of Treatment: 3 History: Diabetes, End Stage Renal Disease Clustered Wound: No Wound Measurements Length: (cm) 0.3 Width: (cm) 0.4 Depth: (cm) 0.1 Area: (cm) 0.094 Volume: (cm) 0.009 % Reduction in Area: 76.1% % Reduction in Volume: 76.9% Epithelialization: Large (67-100%) Tunneling: No Undermining: No Wound Description Classification: Category/Stage II Wound Margin: Indistinct,  nonvisible Exudate Amount: Small Exudate Type: Serous Exudate Color: amber Foul Odor After Cleansing: No Slough/Fibrino No Wound Bed Granulation Amount: Small (  1-33%) Exposed Structure Granulation Quality: Pink Fascia Exposed: No Necrotic Amount: None Present (0%) Fat Layer (Subcutaneous Tissue) Exposed: No Tendon Exposed: No Muscle Exposed: No Joint Exposed: No Bone Exposed: No Limited to Skin Breakdown Treatment Notes Wound #2 (Coccyx) Cleanser Soap and Water Discharge Instruction: May shower and wash wound with dial antibacterial soap and water prior to dressing change. Wound Cleanser Discharge Instruction: Cleanse the wound with wound cleanser prior to applying a clean dressing using gauze sponges, not tissue or cotton balls. Peri-Wound Care Zinc Oxide Ointment 30g tube Discharge Instruction: Apply Zinc Oxide to periwound with each dressing change Topical Primary Dressing Secondary Dressing Secured With Compression Wrap Compression Stockings Add-Ons Electronic Signature(s) Signed: 01/01/2021 6:04:09 PM By: Baruch Gouty RN, BSN Entered By: Baruch Gouty on 01/01/2021 11:14:59 -------------------------------------------------------------------------------- [place] Details Patient Name: Date of Service: [name] Balzarine M. 01/01/2021 10:45 A M Medical Record Number: [id number] Patient Account Number: [id number] Date of Birth/Sex: Treating RN: October 14, 1936 (85 y.o. [name] James Primary Care Yeimi Debnam: PLA CE, CA MDEN Other Clinician: Referring Tanith Dagostino: Treating Alejandria Wessells/Extender: Linton Ham PLA CE, CA MDEN Weeks in Treatment: 3 Vital Signs Time Taken: 11:15 Temperature (F): 97.6 Height (in): 61 Pulse (bpm): 79 Weight (lbs): 113 Respiratory Rate (breaths/min): 18 Body Mass Index (BMI): 21.3 Blood Pressure (mmHg): 131/52 Reference Range: 80 - 120 mg / dl Electronic Signature(s) Signed: 01/01/2021 6:04:09 PM By: Baruch Gouty RN, BSN Entered By:  Baruch Gouty on 01/01/2021 11:13:32

## 2021-01-02 ENCOUNTER — Ambulatory Visit: Payer: Medicare Other | Admitting: Vascular Surgery

## 2021-01-02 ENCOUNTER — Encounter (HOSPITAL_COMMUNITY): Payer: Medicare Other

## 2021-01-10 ENCOUNTER — Ambulatory Visit: Payer: Medicare Other | Admitting: Orthopaedic Surgery

## 2021-01-18 ENCOUNTER — Encounter: Payer: Self-pay | Admitting: *Deleted

## 2021-01-21 ENCOUNTER — Other Ambulatory Visit: Payer: Self-pay

## 2021-01-21 DIAGNOSIS — N186 End stage renal disease: Secondary | ICD-10-CM

## 2021-01-22 ENCOUNTER — Encounter (HOSPITAL_BASED_OUTPATIENT_CLINIC_OR_DEPARTMENT_OTHER): Payer: Medicare Other | Admitting: Internal Medicine

## 2021-01-22 NOTE — Telephone Encounter (Signed)
Dr Hilarie Fredrickson- Patient's daughter is requesting that we call [place] to restart patient's cholestyramine. HOWEVER, it looks like patient was hospitalized in November and had some bowel issues at that time. Please see consult note from 10/30/20. Appears she was to follow with [place] after the hospitalization but has not done so. Do you want me to go ahead and call [place] to restart cholestyramine or do you want imaging on her first to make sure she isnt having impaction symptoms rather than diarrhea?

## 2021-01-24 NOTE — Telephone Encounter (Signed)
Dottie, please make patient's daughter aware that we need to be sure she is not having overflow diarrhea.  CT scan from November 2021 showed distal colonic stool burden/impaction.  People can have overflow diarrhea around distal stool impactions. These 2 things are treated quite differently If able at her facility, would recommend a 2 view abdominal x-ray to assess colonic stool burden. Medical provider at her facility could also perform a rectal exam to ensure there is no rectal fecal impaction If we can be reasonably sure this is not overflow diarrhea, then I am okay restarting cholestyramine

## 2021-01-24 NOTE — Telephone Encounter (Signed)
I have left a message for patient's daughter, Kenney Houseman to call back.  I have spoken to Quillian Quince, nurse at [facility] (phone [phone]) who indicates that the patient was started back on cholestyramine yesterday by the nursing home physician at the request of the patient's family. I advised that Dr Hilarie Fredrickson recommends patient have a 2 view abdominal xray to ensure that patient does not have an impaction with overflow diarrhea rather than true diarrhea due to the fact that when she was hospitalized not long ago, she did have a large stool burden/impaction. Quillian Quince verbalizes understanding and indicates he will actually run this by the nursing facility physician since she is currently in house. He asked that I fax order over to him at fax [phone] which I have done.

## 2021-01-25 ENCOUNTER — Telehealth: Payer: Self-pay | Admitting: Internal Medicine

## 2021-01-25 NOTE — Telephone Encounter (Signed)
I have attempted on 5 different occasions to fax orders to fax [phone] as requested but continue to get a failed fax indication. I have also attempted to reach nurse, Quillian Quince at [phone] but get no response. Patient has an appointment on Monday, 01/28/21 so we will get imaging at that time if needed.

## 2021-01-25 NOTE — Telephone Encounter (Signed)
Fatama from [facility] is requesting a call back from a nurse to advise what can be done for the pt and what orders need to be sent over.  CB [phone]

## 2021-01-25 NOTE — Telephone Encounter (Signed)
I have spoken to [place] at [facility] who indicates that she will order 2 view xray and d/c cholestyramine for now. I have refaxed orders to [phone] since other fax is not working. Rose Phi states she will have patient bring results of xray when she comes for appointment with our office on Tuesday.

## 2021-01-29 ENCOUNTER — Ambulatory Visit (INDEPENDENT_AMBULATORY_CARE_PROVIDER_SITE_OTHER): Payer: Medicare Other | Admitting: Internal Medicine

## 2021-01-29 ENCOUNTER — Other Ambulatory Visit: Payer: Self-pay

## 2021-01-29 ENCOUNTER — Encounter: Payer: Self-pay | Admitting: Internal Medicine

## 2021-01-29 VITALS — BP 116/58 | HR 72

## 2021-01-29 DIAGNOSIS — R197 Diarrhea, unspecified: Secondary | ICD-10-CM

## 2021-01-29 DIAGNOSIS — N186 End stage renal disease: Secondary | ICD-10-CM

## 2021-01-29 DIAGNOSIS — R112 Nausea with vomiting, unspecified: Secondary | ICD-10-CM | POA: Diagnosis not present

## 2021-01-29 DIAGNOSIS — R194 Change in bowel habit: Secondary | ICD-10-CM

## 2021-01-29 DIAGNOSIS — Z992 Dependence on renal dialysis: Secondary | ICD-10-CM

## 2021-01-29 NOTE — Patient Instructions (Addendum)
We will devise a plan regarding an updated bowel regimen for you once we have spoken to your daughter,Tonya. We will speak with and fax [facility] about this.  If you are age 83 or older, your body mass index should be between 23-30. Your There is no height or weight on file to calculate BMI. If this is out of the aforementioned range listed, please consider follow up with your Primary Care Provider.  Due to recent changes in healthcare laws, you may see the results of your imaging and laboratory studies on MyChart before your provider has had a chance to review them.  We understand that in some cases there may be results that are confusing or concerning to you. Not all laboratory results come back in the same time frame and the provider may be waiting for multiple results in order to interpret others.  Please give [place] 48 hours in order for your provider to thoroughly review all the results before contacting the office for clarification of your results.

## 2021-01-31 ENCOUNTER — Encounter: Payer: Self-pay | Admitting: Internal Medicine

## 2021-01-31 ENCOUNTER — Telehealth: Payer: Self-pay | Admitting: *Deleted

## 2021-01-31 ENCOUNTER — Encounter (HOSPITAL_BASED_OUTPATIENT_CLINIC_OR_DEPARTMENT_OTHER): Payer: Medicare Other | Admitting: Internal Medicine

## 2021-01-31 NOTE — Telephone Encounter (Signed)
I have faxed a copy of office note to [facility] a fax number [phone]. I have also contacted Fatama at phone [phone] to advise her that we have faxed new orders regarding Ms.Dettman over to the facility at this fax number. Fatama verbalizes understanding of this and indicates that she will call us if for some reason she does not see these orders.

## 2021-01-31 NOTE — Progress Notes (Signed)
Subjective:    Patient ID: [name] James, female    DOB: 12/10/1936, 85 y.o.   MRN: [id number]  HPI [name] James is an 85 year old female with a past medical history of GERD, Barrett's esophagus, prior gastritis, anemia, IBS with loose stools, ESRD on HD MWF, COPD on oxygen, remote breast cancer who presents for follow-up.  She is here alone today.  She was last seen in Dec 2020.  She reports that she has had more issues with diarrhea of late.  This is particularly an issue for her for dialysis days.  She has had multiple sessions of dialysis needed to be interrupted or cut short due to loose stools.  She and her daughter state that there is little extra staff at the dialysis center to help with any toileting needs and if she has loose stools or an accident during the session it is often cut short.  Patient reports that most stools are loose to very soft.  Typically 1-2 times in the morning and can go a third time in the afternoon.  She is alternating Lomotil with Imodium and often using both before dialysis sessions.  Even with these medication she will still occasionally have diarrhea at dialysis.  Appetite is overall poor but she is still able to eat.  She has some intermittent abdominal pain.  Nausea is frequent for her but vomiting tends to only occur related to dialysis.  No report of blood in stool or melena.  I was able to speak to her daughter [name] James by phone for 25 minutes to help get additional history.  Of note she was seen in the hospital in early November with a fecal impaction.  Cholestyramine was previously used but there was some question as to whether this led to the fecal impaction  I requested a 2 view abdomen x-ray which was performed at [facility] on 01/27/2021.  The impression was as follows: 1.  Significantly limited study due to extensive patient movement as noted above. 2.  No gross evidence for bowel obstruction.  Suggest follow-up for continued clinical  concern.   Review of Systems As per HPI, otherwise negative  Current Medications, Allergies, Past Medical History, Past Surgical History, Family History and Social History were reviewed in [employer] record.     Objective:   Physical Exam BP (!) 116/58    Pulse 72  Gen: awake, alert, pale and frail appearing but in no acute distress HEENT: anicteric CV: RRR Pulm: CTA b/l Abd: soft, NT/ND, +BS throughout Rectal: No external lesions, soft light brown stool in the vault which is heme-negative, no masses, no tenderness, somewhat decreased anal tone Ext: no c/c/e Neuro: nonfocal  CT ABDOMEN AND PELVIS WITHOUT CONTRAST   TECHNIQUE: Multidetector CT imaging of the abdomen and pelvis was performed following the standard protocol without IV contrast.   COMPARISON:  01/22/2016   FINDINGS: Lower chest: There are small chronic bilateral pleural effusions and left lower lobe atelectasis. Extensive calcification of the mitral annulus.   Hepatobiliary: No focal liver abnormality is seen. Status post cholecystectomy. No biliary dilatation.   Pancreas: Diffuse pancreatic atrophy.   Spleen: Normal in size without focal abnormality.   Adrenals/Urinary Tract: Severe bilateral renal cortical atrophy. Bilobed complex hypodensity upper pole left kidney measures 2.3 x 1.8 cm and 1.6 x 1.5 cm. Hounsfield attenuation measures between 47 and 63 Hounsfield units, with thin peripheral calcification along the inferior aspect. A simple cyst was seen within this region previous,  and this likely reflects a hemorrhagic cyst.   There is a nonobstructing 5 mm calculus within the left renal pelvis. No right-sided calculi. No obstructive uropathy.   Stable low-attenuation nodule within the right adrenal gland consistent with adenoma. The left adrenal is normal.   Stomach/Bowel: There is a large amount of stool within the distal colon and rectum. No bowel obstruction or ileus.  No bowel wall thickening or inflammatory change.   Vascular/Lymphatic: Aortic atherosclerosis. No enlarged abdominal or pelvic lymph nodes.   Reproductive: Status post hysterectomy. No adnexal masses.   Other: Patient is markedly cachectic. Dependent body wall edema is seen within the lower abdomen and pelvis. No free intraperitoneal fluid or free gas. No abdominal wall hernia.   Musculoskeletal: No acute displaced fracture. Bones are diffusely osteopenic. Reconstructed images demonstrate no additional findings.   IMPRESSION: 1. Large amount of stool within the distal colon and rectum. 2. Chronic bilateral pleural effusions and left lower lobe atelectasis. 3. Bilobed complex hypodensity upper pole left kidney, likely hemorrhagic cyst. 4. Nonobstructing 5 mm left renal calculus. 5. Aortic Atherosclerosis (ICD10-I70.0).     Electronically Signed   By: Randa Ngo M.D.   On: 10/29/2020 15:50     ESOPHOGRAM/BARIUM SWALLOW   TECHNIQUE: Due to patient's limited mobility, a single contrast examination was performed using thin barium. 13 mm barium tablet was also administered with water.   FLUOROSCOPY TIME:  Number of Acquired Spot Images: 0   Fluoroscopy Time:  2 minutes 48 seconds   Radiation Exposure Index (if provided by the fluoroscopic device): 30.2 mGy   COMPARISON:  None.   FINDINGS: No evidence of vestibular penetration or aspiration during swallowing. No evidence of esophageal mass or stricture. No findings of esophagitis noted. Esophageal motility is within normal limits.   No evidence of hiatal hernia. Mild gastroesophageal reflux was seen to the level of the distal thoracic esophagus. An ingested 38mm barium tablet passed freely through the esophagus, and into the stomach.   IMPRESSION: Mild gastroesophageal reflux. No evidence of hiatal hernia, esophageal stricture, or other significant abnormality.     Electronically Signed   By: Marlaine Hind  M.D.   On: 12/29/2019 12:07       Assessment & Plan:  85 year old female with a past medical history of GERD, Barrett's esophagus, prior gastritis, anemia, IBS with loose stools, ESRD on HD MWF, COPD on oxygen, remote breast cancer who presents for follow-up.   1.  Chronic loose stools/diarrhea/altered bowel function/chronic nausea--we discussed the symptoms today and then by phone with the patient's daughter.  Certainly we do not want loose stools and diarrhea to interfere with her completing dialysis sessions.  It is also noted that in November she had quite the opposite of diarrhea with a fecal impaction and I wanted to exclude overflow diarrhea.  By rectal exam today there is no evidence for fecal impaction or hard stool in the vault.  I would like her to avoid using Lomotil and Imodium on her nondialysis days as I do not want her to develop fecal impaction and she can have help with toileting on her nondialysis days.  I would recommend the following for her and her care providers at [facility]:  --4 mg loperamide and 1 Lomotil tablet on Monday Wednesday and Friday mornings before dialysis --Avoid antidiarrheals on nondialysis days; however can use either 2 mg loperamide or 1 Lomotil tablet on nondialysis days if diarrhea persists after initial bowel movements in the morning --Begin  colestipol 1 g daily --Reduce promethazine to 12.5 mg twice daily as needed --Continue pantoprazole 40 mg twice daily (history of GERD, Barrett's esophagus and gastritis)  2.  Prior dysphagia symptom --not an issue of late per patient today.  Barium esophagram 1 year ago showed reflux but no other significant abnormalities.

## 2021-01-31 NOTE — Telephone Encounter (Signed)
-----   Message from Jerene Bears, MD sent at 01/31/2021  3:23 PM EST ----- Carla Drape See my plan on this note.  We need to get this to [place] [facility]. Thanks [employer]

## 2021-02-07 ENCOUNTER — Telehealth: Payer: Self-pay | Admitting: Internal Medicine

## 2021-02-07 NOTE — Telephone Encounter (Signed)
Pt's daughter Kenney Houseman has questions about Colestipol. Pls call her.

## 2021-02-08 ENCOUNTER — Emergency Department (HOSPITAL_COMMUNITY): Payer: Medicare Other

## 2021-02-08 ENCOUNTER — Other Ambulatory Visit: Payer: Self-pay

## 2021-02-08 ENCOUNTER — Inpatient Hospital Stay (HOSPITAL_COMMUNITY)
Admission: EM | Admit: 2021-02-08 | Discharge: 2021-02-15 | DRG: 640 | Disposition: A | Discharge status: Skilled Nursing Facility | Payer: Medicare Other | Attending: Internal Medicine | Admitting: Internal Medicine

## 2021-02-08 DIAGNOSIS — E1165 Type 2 diabetes mellitus with hyperglycemia: Secondary | ICD-10-CM | POA: Diagnosis not present

## 2021-02-08 DIAGNOSIS — E785 Hyperlipidemia, unspecified: Secondary | ICD-10-CM | POA: Diagnosis present

## 2021-02-08 DIAGNOSIS — D638 Anemia in other chronic diseases classified elsewhere: Secondary | ICD-10-CM | POA: Diagnosis present

## 2021-02-08 DIAGNOSIS — Z872 Personal history of diseases of the skin and subcutaneous tissue: Secondary | ICD-10-CM

## 2021-02-08 DIAGNOSIS — E8889 Other specified metabolic disorders: Secondary | ICD-10-CM | POA: Diagnosis present

## 2021-02-08 DIAGNOSIS — I509 Heart failure, unspecified: Secondary | ICD-10-CM | POA: Diagnosis not present

## 2021-02-08 DIAGNOSIS — E8809 Other disorders of plasma-protein metabolism, not elsewhere classified: Secondary | ICD-10-CM | POA: Diagnosis present

## 2021-02-08 DIAGNOSIS — E11649 Type 2 diabetes mellitus with hypoglycemia without coma: Secondary | ICD-10-CM | POA: Diagnosis present

## 2021-02-08 DIAGNOSIS — D631 Anemia in chronic kidney disease: Secondary | ICD-10-CM | POA: Diagnosis present

## 2021-02-08 DIAGNOSIS — F411 Generalized anxiety disorder: Secondary | ICD-10-CM | POA: Diagnosis present

## 2021-02-08 DIAGNOSIS — K529 Noninfective gastroenteritis and colitis, unspecified: Secondary | ICD-10-CM | POA: Diagnosis present

## 2021-02-08 DIAGNOSIS — R5381 Other malaise: Secondary | ICD-10-CM | POA: Diagnosis not present

## 2021-02-08 DIAGNOSIS — E8770 Fluid overload, unspecified: Secondary | ICD-10-CM | POA: Diagnosis present

## 2021-02-08 DIAGNOSIS — E1129 Type 2 diabetes mellitus with other diabetic kidney complication: Secondary | ICD-10-CM | POA: Diagnosis present

## 2021-02-08 DIAGNOSIS — Z881 Allergy status to other antibiotic agents status: Secondary | ICD-10-CM | POA: Diagnosis not present

## 2021-02-08 DIAGNOSIS — Z7982 Long term (current) use of aspirin: Secondary | ICD-10-CM | POA: Diagnosis not present

## 2021-02-08 DIAGNOSIS — Z515 Encounter for palliative care: Secondary | ICD-10-CM | POA: Diagnosis not present

## 2021-02-08 DIAGNOSIS — Z9013 Acquired absence of bilateral breasts and nipples: Secondary | ICD-10-CM | POA: Diagnosis not present

## 2021-02-08 DIAGNOSIS — Z20822 Contact with and (suspected) exposure to covid-19: Secondary | ICD-10-CM | POA: Diagnosis present

## 2021-02-08 DIAGNOSIS — Z885 Allergy status to narcotic agent status: Secondary | ICD-10-CM

## 2021-02-08 DIAGNOSIS — H409 Unspecified glaucoma: Secondary | ICD-10-CM | POA: Diagnosis present

## 2021-02-08 DIAGNOSIS — J90 Pleural effusion, not elsewhere classified: Secondary | ICD-10-CM | POA: Diagnosis not present

## 2021-02-08 DIAGNOSIS — Z880 Allergy status to penicillin: Secondary | ICD-10-CM | POA: Diagnosis not present

## 2021-02-08 DIAGNOSIS — Z853 Personal history of malignant neoplasm of breast: Secondary | ICD-10-CM

## 2021-02-08 DIAGNOSIS — I132 Hypertensive heart and chronic kidney disease with heart failure and with stage 5 chronic kidney disease, or end stage renal disease: Secondary | ICD-10-CM | POA: Diagnosis present

## 2021-02-08 DIAGNOSIS — J811 Chronic pulmonary edema: Secondary | ICD-10-CM | POA: Diagnosis present

## 2021-02-08 DIAGNOSIS — I5032 Chronic diastolic (congestive) heart failure: Secondary | ICD-10-CM | POA: Diagnosis present

## 2021-02-08 DIAGNOSIS — E1122 Type 2 diabetes mellitus with diabetic chronic kidney disease: Secondary | ICD-10-CM | POA: Diagnosis present

## 2021-02-08 DIAGNOSIS — K219 Gastro-esophageal reflux disease without esophagitis: Secondary | ICD-10-CM | POA: Diagnosis present

## 2021-02-08 DIAGNOSIS — N2581 Secondary hyperparathyroidism of renal origin: Secondary | ICD-10-CM | POA: Diagnosis present

## 2021-02-08 DIAGNOSIS — I495 Sick sinus syndrome: Secondary | ICD-10-CM | POA: Diagnosis present

## 2021-02-08 DIAGNOSIS — Z9115 Patient's noncompliance with renal dialysis: Secondary | ICD-10-CM

## 2021-02-08 DIAGNOSIS — Z8249 Family history of ischemic heart disease and other diseases of the circulatory system: Secondary | ICD-10-CM

## 2021-02-08 DIAGNOSIS — E46 Unspecified protein-calorie malnutrition: Secondary | ICD-10-CM | POA: Diagnosis present

## 2021-02-08 DIAGNOSIS — E1151 Type 2 diabetes mellitus with diabetic peripheral angiopathy without gangrene: Secondary | ICD-10-CM | POA: Diagnosis present

## 2021-02-08 DIAGNOSIS — G8929 Other chronic pain: Secondary | ICD-10-CM | POA: Diagnosis present

## 2021-02-08 DIAGNOSIS — G4700 Insomnia, unspecified: Secondary | ICD-10-CM | POA: Diagnosis present

## 2021-02-08 DIAGNOSIS — M109 Gout, unspecified: Secondary | ICD-10-CM | POA: Diagnosis present

## 2021-02-08 DIAGNOSIS — R4189 Other symptoms and signs involving cognitive functions and awareness: Secondary | ICD-10-CM | POA: Diagnosis present

## 2021-02-08 DIAGNOSIS — K2970 Gastritis, unspecified, without bleeding: Secondary | ICD-10-CM | POA: Diagnosis present

## 2021-02-08 DIAGNOSIS — R54 Age-related physical debility: Secondary | ICD-10-CM | POA: Diagnosis not present

## 2021-02-08 DIAGNOSIS — Z888 Allergy status to other drugs, medicaments and biological substances status: Secondary | ICD-10-CM

## 2021-02-08 DIAGNOSIS — Z794 Long term (current) use of insulin: Secondary | ICD-10-CM

## 2021-02-08 DIAGNOSIS — Z992 Dependence on renal dialysis: Secondary | ICD-10-CM

## 2021-02-08 DIAGNOSIS — N186 End stage renal disease: Secondary | ICD-10-CM

## 2021-02-08 DIAGNOSIS — Z9981 Dependence on supplemental oxygen: Secondary | ICD-10-CM

## 2021-02-08 DIAGNOSIS — Z7189 Other specified counseling: Secondary | ICD-10-CM | POA: Diagnosis not present

## 2021-02-08 DIAGNOSIS — Z833 Family history of diabetes mellitus: Secondary | ICD-10-CM

## 2021-02-08 DIAGNOSIS — E1142 Type 2 diabetes mellitus with diabetic polyneuropathy: Secondary | ICD-10-CM | POA: Diagnosis present

## 2021-02-08 DIAGNOSIS — R531 Weakness: Secondary | ICD-10-CM | POA: Diagnosis present

## 2021-02-08 DIAGNOSIS — I953 Hypotension of hemodialysis: Secondary | ICD-10-CM | POA: Diagnosis not present

## 2021-02-08 DIAGNOSIS — IMO0002 Reserved for concepts with insufficient information to code with codable children: Secondary | ICD-10-CM | POA: Diagnosis present

## 2021-02-08 DIAGNOSIS — I10 Essential (primary) hypertension: Secondary | ICD-10-CM | POA: Diagnosis present

## 2021-02-08 DIAGNOSIS — D696 Thrombocytopenia, unspecified: Secondary | ICD-10-CM | POA: Diagnosis present

## 2021-02-08 DIAGNOSIS — I700 Atherosclerosis of aorta: Secondary | ICD-10-CM | POA: Diagnosis present

## 2021-02-08 DIAGNOSIS — I480 Paroxysmal atrial fibrillation: Secondary | ICD-10-CM | POA: Diagnosis not present

## 2021-02-08 DIAGNOSIS — L89152 Pressure ulcer of sacral region, stage 2: Secondary | ICD-10-CM | POA: Diagnosis present

## 2021-02-08 DIAGNOSIS — Z789 Other specified health status: Secondary | ICD-10-CM | POA: Diagnosis not present

## 2021-02-08 DIAGNOSIS — R609 Edema, unspecified: Secondary | ICD-10-CM | POA: Diagnosis present

## 2021-02-08 LAB — COMPREHENSIVE METABOLIC PANEL
ALT: 18 U/L (ref 0–44)
AST: 31 U/L (ref 15–41)
Albumin: 2 g/dL — ABNORMAL LOW (ref 3.5–5.0)
Alkaline Phosphatase: 66 U/L (ref 38–126)
Anion gap: 12 (ref 5–15)
BUN: 23 mg/dL (ref 8–23)
CO2: 25 mmol/L (ref 22–32)
Calcium: 8 mg/dL — ABNORMAL LOW (ref 8.9–10.3)
Chloride: 103 mmol/L (ref 98–111)
Creatinine, Ser: 3.54 mg/dL — ABNORMAL HIGH (ref 0.44–1.00)
GFR, Estimated: 12 mL/min — ABNORMAL LOW (ref >60)
Glucose, Bld: 80 mg/dL (ref 70–99)
Potassium: 4.6 mmol/L (ref 3.5–5.1)
Sodium: 140 mmol/L (ref 135–145)
Total Bilirubin: 0.7 mg/dL (ref 0.3–1.2)
Total Protein: 5.3 g/dL — ABNORMAL LOW (ref 6.5–8.1)

## 2021-02-08 LAB — CBC WITH DIFFERENTIAL/PLATELET
Abs Immature Granulocytes: 0.03 10*3/uL (ref 0.00–0.07)
Basophils Absolute: 0 10*3/uL (ref 0.0–0.1)
Basophils Relative: 1 %
Eosinophils Absolute: 0 10*3/uL (ref 0.0–0.5)
Eosinophils Relative: 0 %
HCT: 38.4 % (ref 36.0–46.0)
Hemoglobin: 11.8 g/dL — ABNORMAL LOW (ref 12.0–15.0)
Immature Granulocytes: 0 %
Lymphocytes Relative: 16 %
Lymphs Abs: 1.5 10*3/uL (ref 0.7–4.0)
MCH: 31.2 pg (ref 26.0–34.0)
MCHC: 30.7 g/dL (ref 30.0–36.0)
MCV: 101.6 fL — ABNORMAL HIGH (ref 80.0–100.0)
Monocytes Absolute: 0.5 10*3/uL (ref 0.1–1.0)
Monocytes Relative: 6 %
Neutro Abs: 6.8 10*3/uL (ref 1.7–7.7)
Neutrophils Relative %: 77 %
Platelets: 123 10*3/uL — ABNORMAL LOW (ref 150–400)
RBC: 3.78 MIL/uL — ABNORMAL LOW (ref 3.87–5.11)
RDW: 16.6 % — ABNORMAL HIGH (ref 11.5–15.5)
WBC: 8.9 10*3/uL (ref 4.0–10.5)
nRBC: 0 % (ref 0.0–0.2)

## 2021-02-08 LAB — CBG MONITORING, ED: Glucose-Capillary: 76 mg/dL (ref 70–99)

## 2021-02-08 LAB — TROPONIN I (HIGH SENSITIVITY)
Troponin I (High Sensitivity): 22 ng/L — ABNORMAL HIGH (ref <18)
Troponin I (High Sensitivity): 24 ng/L — ABNORMAL HIGH (ref <18)

## 2021-02-08 LAB — BRAIN NATRIURETIC PEPTIDE: B Natriuretic Peptide: 442.2 pg/mL — ABNORMAL HIGH (ref 0.0–100.0)

## 2021-02-08 MED ORDER — BRINZOLAMIDE 1 % OP SUSP
1.0000 [drp] | Freq: Two times a day (BID) | OPHTHALMIC | Status: DC
  Administered 2021-02-10 – 2021-02-14 (×7): 1 [drp] via OPHTHALMIC
  Filled 2021-02-08: qty 10

## 2021-02-08 MED ORDER — LOPERAMIDE HCL 2 MG PO CAPS: 4.0000 mg | ORAL_CAPSULE | Freq: Three times a day (TID) | ORAL | Status: DC | PRN

## 2021-02-08 MED ORDER — CALCIUM CARBONATE ANTACID 1250 MG/5ML PO SUSP
500.0000 mg | Freq: Four times a day (QID) | ORAL | Status: DC | PRN
  Administered 2021-02-10: 500 mg via ORAL
  Filled 2021-02-08 (×4): qty 5

## 2021-02-08 MED ORDER — CALCIUM CARBONATE ANTACID 500 MG PO CHEW
1.0000 | CHEWABLE_TABLET | Freq: Two times a day (BID) | ORAL | Status: DC
  Administered 2021-02-09 – 2021-02-14 (×11): 200 mg via ORAL
  Filled 2021-02-08 (×13): qty 1

## 2021-02-08 MED ORDER — ONDANSETRON HCL 4 MG/2ML IJ SOLN
4.0000 mg | Freq: Four times a day (QID) | INTRAMUSCULAR | Status: DC | PRN
  Administered 2021-02-12: 4 mg via INTRAVENOUS
  Filled 2021-02-08: qty 2

## 2021-02-08 MED ORDER — CAMPHOR-MENTHOL 0.5-0.5 % EX LOTN
1.0000 "application " | TOPICAL_LOTION | Freq: Three times a day (TID) | CUTANEOUS | Status: DC | PRN
  Filled 2021-02-08: qty 222

## 2021-02-08 MED ORDER — MENTHOL (TOPICAL ANALGESIC) 4 % EX GEL: 1.0000 "application " | Freq: Three times a day (TID) | CUTANEOUS | Status: DC | PRN

## 2021-02-08 MED ORDER — ACETAMINOPHEN 650 MG RE SUPP: 650.0000 mg | Freq: Four times a day (QID) | RECTAL | Status: DC | PRN

## 2021-02-08 MED ORDER — DIPHENOXYLATE-ATROPINE 2.5-0.025 MG PO TABS: 1.0000 | ORAL_TABLET | Freq: Four times a day (QID) | ORAL | Status: DC | PRN

## 2021-02-08 MED ORDER — BRIMONIDINE TARTRATE 0.2 % OP SOLN
1.0000 [drp] | Freq: Two times a day (BID) | OPHTHALMIC | Status: DC
  Administered 2021-02-10 – 2021-02-15 (×11): 1 [drp] via OPHTHALMIC
  Filled 2021-02-08: qty 5

## 2021-02-08 MED ORDER — ACETAMINOPHEN 325 MG PO TABS: 650.0000 mg | ORAL_TABLET | Freq: Four times a day (QID) | ORAL | Status: DC | PRN

## 2021-02-08 MED ORDER — HYDROXYZINE HCL 25 MG PO TABS: 25.0000 mg | ORAL_TABLET | Freq: Three times a day (TID) | ORAL | Status: DC | PRN

## 2021-02-08 MED ORDER — TRAZODONE HCL 50 MG PO TABS
50.0000 mg | ORAL_TABLET | Freq: Every day | ORAL | Status: DC
  Administered 2021-02-09 – 2021-02-15 (×5): 50 mg via ORAL
  Filled 2021-02-08 (×7): qty 1

## 2021-02-08 MED ORDER — ACETAMINOPHEN 325 MG PO TABS
650.0000 mg | ORAL_TABLET | Freq: Four times a day (QID) | ORAL | Status: DC | PRN
  Administered 2021-02-09: 650 mg via ORAL
  Filled 2021-02-08 (×2): qty 2

## 2021-02-08 MED ORDER — DOCUSATE SODIUM 283 MG RE ENEM: 1.0000 | ENEMA | RECTAL | Status: DC | PRN

## 2021-02-08 MED ORDER — ALLOPURINOL 100 MG PO TABS
100.0000 mg | ORAL_TABLET | Freq: Every day | ORAL | Status: DC
  Administered 2021-02-10 – 2021-02-15 (×6): 100 mg via ORAL
  Filled 2021-02-08 (×5): qty 1

## 2021-02-08 MED ORDER — SODIUM CHLORIDE 0.9 % IV SOLN: 250.0000 mL | INTRAVENOUS | Status: DC | PRN

## 2021-02-08 MED ORDER — ONDANSETRON HCL 4 MG PO TABS: 4.0000 mg | ORAL_TABLET | Freq: Four times a day (QID) | ORAL | Status: DC | PRN

## 2021-02-08 MED ORDER — PANTOPRAZOLE SODIUM 40 MG PO TBEC
40.0000 mg | DELAYED_RELEASE_TABLET | Freq: Two times a day (BID) | ORAL | Status: DC
  Administered 2021-02-09 – 2021-02-15 (×14): 40 mg via ORAL
  Filled 2021-02-08 (×14): qty 1

## 2021-02-08 MED ORDER — FLUTICASONE PROPIONATE 50 MCG/ACT NA SUSP
2.0000 | Freq: Two times a day (BID) | NASAL | Status: DC
  Administered 2021-02-10 – 2021-02-15 (×11): 2 via NASAL
  Filled 2021-02-08: qty 16

## 2021-02-08 MED ORDER — INSULIN ASPART 100 UNIT/ML ~~LOC~~ SOLN: 0.0000 [IU] | Freq: Three times a day (TID) | SUBCUTANEOUS | Status: DC

## 2021-02-08 MED ORDER — POLYETHYLENE GLYCOL 3350 17 G PO PACK: 17.0000 g | PACK | Freq: Every day | ORAL | Status: DC | PRN

## 2021-02-08 MED ORDER — MELATONIN 5 MG PO TABS
10.0000 mg | ORAL_TABLET | Freq: Every day | ORAL | Status: DC
  Administered 2021-02-09 – 2021-02-15 (×8): 10 mg via ORAL
  Filled 2021-02-08 (×9): qty 2

## 2021-02-08 MED ORDER — DICLOFENAC SODIUM 1 % EX GEL
2.0000 g | Freq: Every day | CUTANEOUS | Status: DC
  Administered 2021-02-11 – 2021-02-15 (×3): 2 g via TOPICAL
  Filled 2021-02-08: qty 100

## 2021-02-08 MED ORDER — HEPARIN SODIUM (PORCINE) 5000 UNIT/ML IJ SOLN
5000.0000 [IU] | Freq: Three times a day (TID) | INTRAMUSCULAR | Status: DC
  Administered 2021-02-09 – 2021-02-15 (×20): 5000 [IU] via SUBCUTANEOUS
  Filled 2021-02-08 (×17): qty 1

## 2021-02-08 MED ORDER — DIMETHICONE 5 % EX CREA
1.0000 | TOPICAL_CREAM | CUTANEOUS | Status: DC
Start: 2021-02-08 — End: 2021-02-09

## 2021-02-08 MED ORDER — SODIUM CHLORIDE 0.9% FLUSH
3.0000 mL | Freq: Two times a day (BID) | INTRAVENOUS | Status: DC
  Administered 2021-02-09 – 2021-02-15 (×14): 3 mL via INTRAVENOUS

## 2021-02-08 MED ORDER — SORBITOL 70 % SOLN
30.0000 mL | Status: DC | PRN
  Administered 2021-02-14: 30 mL via ORAL
  Filled 2021-02-08: qty 30

## 2021-02-08 MED ORDER — LORATADINE 10 MG PO TABS
10.0000 mg | ORAL_TABLET | Freq: Every day | ORAL | Status: DC
  Administered 2021-02-10 – 2021-02-15 (×6): 10 mg via ORAL
  Filled 2021-02-08 (×7): qty 1

## 2021-02-08 MED ORDER — MIDODRINE HCL 5 MG PO TABS
2.5000 mg | ORAL_TABLET | Freq: Three times a day (TID) | ORAL | Status: DC
  Administered 2021-02-09 – 2021-02-10 (×4): 2.5 mg via ORAL
  Filled 2021-02-08 (×5): qty 1

## 2021-02-08 MED ORDER — PROMETHAZINE HCL 25 MG PO TABS
12.5000 mg | ORAL_TABLET | Freq: Two times a day (BID) | ORAL | Status: DC | PRN
  Filled 2021-02-08: qty 1

## 2021-02-08 MED ORDER — NEPRO/CARBSTEADY PO LIQD: 237.0000 mL | Freq: Three times a day (TID) | ORAL | Status: DC | PRN

## 2021-02-08 MED ORDER — SODIUM CHLORIDE 0.9% FLUSH: 3.0000 mL | INTRAVENOUS | Status: DC | PRN

## 2021-02-08 MED ORDER — COLESTIPOL HCL 1 G PO TABS
1.0000 g | ORAL_TABLET | Freq: Every day | ORAL | Status: DC
  Administered 2021-02-10 – 2021-02-15 (×6): 1 g via ORAL
  Filled 2021-02-08 (×6): qty 1

## 2021-02-08 MED ORDER — AMIODARONE HCL 200 MG PO TABS
200.0000 mg | ORAL_TABLET | Freq: Every day | ORAL | Status: DC
  Administered 2021-02-09 – 2021-02-15 (×7): 200 mg via ORAL
  Filled 2021-02-08 (×7): qty 1

## 2021-02-08 NOTE — ED Notes (Signed)
Pt transferred to MRI

## 2021-02-08 NOTE — Telephone Encounter (Signed)
Let's increase colestipol to 2 g daily before trying cholestyramine  Ensure she is getting lomotil and imodium before leaving for HD to try and avoid accident BMs which cause HD to be cut short

## 2021-02-08 NOTE — ED Provider Notes (Signed)
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 no administration in time range)  brimonidine (ALPHAGAN) 0.2 % ophthalmic solution 1 drop (has no administration in time range)  acetaminophen (TYLENOL) tablet 650 mg (has no administration in time range)    Or  acetaminophen (TYLENOL) suppository 650 mg (has no administration in time range)  camphor-menthol (SARNA) lotion 1 application (has no administration in time range)    And  hydrOXYzine (ATARAX/VISTARIL) tablet 25 mg (has no administration in time range)  sorbitol 70 % solution 30 mL (has no administration in time range)  docusate sodium (ENEMEEZ) enema 283 mg (has no administration in  time range)  ondansetron (ZOFRAN) tablet 4 mg (has no administration in time range)    Or  ondansetron (ZOFRAN) injection 4 mg (has no administration in time range)  calcium carbonate (dosed in mg elemental calcium) suspension 500 mg of elemental calcium (has no administration in time range)  feeding supplement (NEPRO CARB STEADY) liquid 237 mL (has no administration in time range)  heparin injection 5,000 Units (has no administration in time range)  sodium chloride flush (NS) 0.9 % injection 3 mL (has no administration in time range)  sodium chloride flush (NS) 0.9 % injection 3 mL (has no administration in time range)  0.9 %  sodium chloride infusion (has no administration in time range)  diphenoxylate-atropine (LOMOTIL) 2.5-0.025 MG per tablet 1 tablet (has no administration in time range)  insulin aspart (novoLOG) injection 0-6 Units (has no administration in time range)    ED Course  I have reviewed the triage vital signs and the nursing notes.  Pertinent labs & imaging results that were available during my care of the patient were reviewed by me and considered in my medical decision making (see chart for details).    MDM Rules/Calculators/A&P                          Patient with generalized weakness. No focal electrolyte disturbances but does have CHF. Daughter notes minimal dialysis this week, often due to GI issues. No dialysis today. Will need a couple sessions probably, and while she is not in distress or hypoxic, I think it would be best to admit her for this for fluid management. Admit to hospitalist.  Final Clinical Impression(s) / ED Diagnoses Final diagnoses:  Generalized weakness  Acute congestive heart failure, unspecified heart failure type [facility])    Rx / DC Orders ED Discharge Orders    None       Sherwood Gambler, MD 02/09/21 [phone]

## 2021-02-08 NOTE — H&P (Signed)
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 their chronic comorbidities is felt to place them at high risk for further clinical deterioration. Furthermore, it is not anticipated that the patient will be medically stable for discharge from the hospital within 2 midnights of admission. The following factors support the patient status of inpatient.   " The patient's presenting symptoms include dyspnea, potentially worsening pleural effusion, history of diastolic CHF. " The worrisome physical exam findings include lower  extremity edema, pulmonary rales,. " The initial radiographic and laboratory data are worrisome because of reasons noted above. " The chronic co-morbidities include reasons noted above.   * I  certify that at the point of admission it is my clinical judgment that the patient will require inpatient hospital care spanning beyond 2 midnights from the point of admission due to high intensity of service, high risk for further deterioration and high frequency of surveillance required.Emeterio Reeve DO [facility]   If 7PM-7AM, please contact night-coverage / AMION   02/08/2021, 9:43 PM

## 2021-02-08 NOTE — ED Triage Notes (Addendum)
Pt arrives to ED BIB [facility] coming from [place] due to weakness. Per EMS pt has been weak x1 along with N/V/D. Per EMS pt went to Dialysis this morning and was turned away by dialysis stating she was too weak to receive treatment. Pt receives dialysis MWF. Pt A/O x4.  BP 134/74 HR 65 O2 100% 2L [place] CBG 104

## 2021-02-08 NOTE — Telephone Encounter (Signed)
Patient's daughter,Tonya states that there was conversation about starting patient back on questran if Colestid does not work. Patient has been taking Colestid since earlier in the week and continues with loose stools and inability to complete dialysis the last 2-3 scheduled days. Daughter indicates that she is scared patient will end up in the hospital if she misses another dialysis day. She states that the nursing home did not start patient back on Colestid until this week even though our updated orders were sent to them on 01/31/21. Would you like orders for questran to be sent to [facility] place? If so, sig?

## 2021-02-09 ENCOUNTER — Inpatient Hospital Stay (HOSPITAL_COMMUNITY): Payer: Medicare Other

## 2021-02-09 DIAGNOSIS — I509 Heart failure, unspecified: Secondary | ICD-10-CM | POA: Diagnosis not present

## 2021-02-09 DIAGNOSIS — I5032 Chronic diastolic (congestive) heart failure: Secondary | ICD-10-CM

## 2021-02-09 DIAGNOSIS — N186 End stage renal disease: Secondary | ICD-10-CM | POA: Diagnosis not present

## 2021-02-09 DIAGNOSIS — E8809 Other disorders of plasma-protein metabolism, not elsewhere classified: Secondary | ICD-10-CM

## 2021-02-09 DIAGNOSIS — J90 Pleural effusion, not elsewhere classified: Secondary | ICD-10-CM

## 2021-02-09 DIAGNOSIS — R5381 Other malaise: Secondary | ICD-10-CM

## 2021-02-09 DIAGNOSIS — I480 Paroxysmal atrial fibrillation: Secondary | ICD-10-CM

## 2021-02-09 DIAGNOSIS — R531 Weakness: Secondary | ICD-10-CM | POA: Diagnosis not present

## 2021-02-09 DIAGNOSIS — Z7189 Other specified counseling: Secondary | ICD-10-CM

## 2021-02-09 DIAGNOSIS — E1129 Type 2 diabetes mellitus with other diabetic kidney complication: Secondary | ICD-10-CM

## 2021-02-09 DIAGNOSIS — D638 Anemia in other chronic diseases classified elsewhere: Secondary | ICD-10-CM

## 2021-02-09 DIAGNOSIS — R54 Age-related physical debility: Secondary | ICD-10-CM

## 2021-02-09 DIAGNOSIS — E1165 Type 2 diabetes mellitus with hyperglycemia: Secondary | ICD-10-CM

## 2021-02-09 DIAGNOSIS — E46 Unspecified protein-calorie malnutrition: Secondary | ICD-10-CM

## 2021-02-09 DIAGNOSIS — Z789 Other specified health status: Secondary | ICD-10-CM

## 2021-02-09 DIAGNOSIS — Z515 Encounter for palliative care: Secondary | ICD-10-CM

## 2021-02-09 DIAGNOSIS — Z992 Dependence on renal dialysis: Secondary | ICD-10-CM

## 2021-02-09 LAB — CBC WITH DIFFERENTIAL/PLATELET
Abs Immature Granulocytes: 0.02 10*3/uL (ref 0.00–0.07)
Basophils Absolute: 0.1 10*3/uL (ref 0.0–0.1)
Basophils Relative: 1 %
Eosinophils Absolute: 0.1 10*3/uL (ref 0.0–0.5)
Eosinophils Relative: 2 %
HCT: 32.6 % — ABNORMAL LOW (ref 36.0–46.0)
Hemoglobin: 10.6 g/dL — ABNORMAL LOW (ref 12.0–15.0)
Immature Granulocytes: 0 %
Lymphocytes Relative: 27 %
Lymphs Abs: 2.1 10*3/uL (ref 0.7–4.0)
MCH: 32.6 pg (ref 26.0–34.0)
MCHC: 32.5 g/dL (ref 30.0–36.0)
MCV: 100.3 fL — ABNORMAL HIGH (ref 80.0–100.0)
Monocytes Absolute: 0.5 10*3/uL (ref 0.1–1.0)
Monocytes Relative: 7 %
Neutro Abs: 4.9 10*3/uL (ref 1.7–7.7)
Neutrophils Relative %: 63 %
Platelets: 121 10*3/uL — ABNORMAL LOW (ref 150–400)
RBC: 3.25 MIL/uL — ABNORMAL LOW (ref 3.87–5.11)
RDW: 16.6 % — ABNORMAL HIGH (ref 11.5–15.5)
WBC: 7.7 10*3/uL (ref 4.0–10.5)
nRBC: 0 % (ref 0.0–0.2)

## 2021-02-09 LAB — CBG MONITORING, ED
Glucose-Capillary: 52 mg/dL — ABNORMAL LOW (ref 70–99)
Glucose-Capillary: 153 mg/dL — ABNORMAL HIGH (ref 70–99)
Glucose-Capillary: 53 mg/dL — ABNORMAL LOW (ref 70–99)
Glucose-Capillary: 105 mg/dL — ABNORMAL HIGH (ref 70–99)

## 2021-02-09 LAB — HEMOGLOBIN A1C
Hgb A1c MFr Bld: 4.6 % — ABNORMAL LOW (ref 4.8–5.6)
Mean Plasma Glucose: 85.32 mg/dL

## 2021-02-09 LAB — COMPREHENSIVE METABOLIC PANEL
ALT: 15 U/L (ref 0–44)
AST: 18 U/L (ref 15–41)
Albumin: 1.6 g/dL — ABNORMAL LOW (ref 3.5–5.0)
Alkaline Phosphatase: 54 U/L (ref 38–126)
Anion gap: 13 (ref 5–15)
BUN: 23 mg/dL (ref 8–23)
CO2: 27 mmol/L (ref 22–32)
Calcium: 7.8 mg/dL — ABNORMAL LOW (ref 8.9–10.3)
Chloride: 101 mmol/L (ref 98–111)
Creatinine, Ser: 3.91 mg/dL — ABNORMAL HIGH (ref 0.44–1.00)
GFR, Estimated: 11 mL/min — ABNORMAL LOW (ref >60)
Glucose, Bld: 78 mg/dL (ref 70–99)
Potassium: 4.3 mmol/L (ref 3.5–5.1)
Sodium: 141 mmol/L (ref 135–145)
Total Bilirubin: 0.3 mg/dL (ref 0.3–1.2)
Total Protein: 4.6 g/dL — ABNORMAL LOW (ref 6.5–8.1)

## 2021-02-09 LAB — ECHOCARDIOGRAM COMPLETE
AR max vel: 2.03 cm2
AV Area VTI: 2 cm2
AV Area mean vel: 1.95 cm2
AV Mean grad: 4 mmHg
AV Peak grad: 8.8 mmHg
Ao pk vel: 1.48 m/s
S' Lateral: 2.2 cm
Single Plane A4C EF: 78.6 %
Weight: 1975.32 oz

## 2021-02-09 LAB — SARS CORONAVIRUS 2 (TAT 6-24 HRS): SARS Coronavirus 2: NEGATIVE

## 2021-02-09 MED ORDER — ALTEPLASE 2 MG IJ SOLR: 2.0000 mg | Freq: Once | INTRAMUSCULAR | Status: DC | PRN

## 2021-02-09 MED ORDER — DEXTROSE 50 % IV SOLN
INTRAVENOUS | Status: AC
  Administered 2021-02-09: 50 mL
  Filled 2021-02-09: qty 50

## 2021-02-09 MED ORDER — PROSOURCE PLUS PO LIQD
30.0000 mL | Freq: Two times a day (BID) | ORAL | Status: DC
  Administered 2021-02-10 – 2021-02-15 (×7): 30 mL via ORAL
  Filled 2021-02-09 (×8): qty 30

## 2021-02-09 MED ORDER — LIDOCAINE-PRILOCAINE 2.5-2.5 % EX CREA: 1.0000 "application " | TOPICAL_CREAM | CUTANEOUS | Status: DC | PRN

## 2021-02-09 MED ORDER — DIMETHICONE 1 % EX CREA
TOPICAL_CREAM | Freq: Two times a day (BID) | CUTANEOUS | Status: DC
  Filled 2021-02-09: qty 113

## 2021-02-09 MED ORDER — TRAMADOL HCL 50 MG PO TABS
50.0000 mg | ORAL_TABLET | Freq: Four times a day (QID) | ORAL | Status: DC | PRN
  Administered 2021-02-09: 100 mg via ORAL
  Filled 2021-02-09: qty 2

## 2021-02-09 MED ORDER — ALBUMIN HUMAN 25 % IV SOLN
INTRAVENOUS | Status: AC
  Filled 2021-02-09: qty 100

## 2021-02-09 MED ORDER — SODIUM CHLORIDE 0.9 % IV SOLN: 100.0000 mL | INTRAVENOUS | Status: DC | PRN

## 2021-02-09 MED ORDER — PENTAFLUOROPROP-TETRAFLUOROETH EX AERO: 1.0000 "application " | INHALATION_SPRAY | CUTANEOUS | Status: DC | PRN

## 2021-02-09 MED ORDER — HEPARIN SODIUM (PORCINE) 1000 UNIT/ML DIALYSIS: 1000.0000 [IU] | INTRAMUSCULAR | Status: DC | PRN

## 2021-02-09 MED ORDER — NEPRO/CARBSTEADY PO LIQD
237.0000 mL | Freq: Three times a day (TID) | ORAL | Status: DC
  Administered 2021-02-09 – 2021-02-15 (×6): 237 mL via ORAL
  Filled 2021-02-09: qty 237

## 2021-02-09 MED ORDER — TRAMADOL HCL 50 MG PO TABS
50.0000 mg | ORAL_TABLET | Freq: Four times a day (QID) | ORAL | Status: DC | PRN
  Administered 2021-02-10 – 2021-02-13 (×2): 100 mg via ORAL
  Filled 2021-02-09 (×2): qty 2

## 2021-02-09 MED ORDER — HYDROCODONE-ACETAMINOPHEN 5-325 MG PO TABS
1.0000 | ORAL_TABLET | Freq: Four times a day (QID) | ORAL | Status: DC | PRN
  Administered 2021-02-09 – 2021-02-15 (×7): 1 via ORAL
  Filled 2021-02-09 (×7): qty 1

## 2021-02-09 MED ORDER — LIDOCAINE HCL (PF) 1 % IJ SOLN: 5.0000 mL | INTRAMUSCULAR | Status: DC | PRN

## 2021-02-09 MED ORDER — HEPARIN SODIUM (PORCINE) 1000 UNIT/ML IJ SOLN
INTRAMUSCULAR | Status: AC
  Administered 2021-02-09: 1000 [IU]
  Filled 2021-02-09: qty 4

## 2021-02-09 MED ORDER — CHLORHEXIDINE GLUCONATE CLOTH 2 % EX PADS
6.0000 | MEDICATED_PAD | Freq: Every day | CUTANEOUS | Status: DC
  Administered 2021-02-10 – 2021-02-15 (×6): 6 via TOPICAL

## 2021-02-09 MED ORDER — ATROPINE SULFATE 1 MG/10ML IJ SOSY
PREFILLED_SYRINGE | INTRAMUSCULAR | Status: AC
  Filled 2021-02-09: qty 10

## 2021-02-09 MED ORDER — ALBUMIN HUMAN 25 % IV SOLN
25.0000 g | Freq: Four times a day (QID) | INTRAVENOUS | Status: DC
  Administered 2021-02-09: 25 g via INTRAVENOUS
  Administered 2021-02-10 (×4): 12.5 g via INTRAVENOUS
  Administered 2021-02-10: 25 g via INTRAVENOUS
  Administered 2021-02-10: 12.5 g via INTRAVENOUS
  Administered 2021-02-11: 25 g via INTRAVENOUS
  Filled 2021-02-09 (×7): qty 100

## 2021-02-09 NOTE — ED Notes (Signed)
Pt CBG 52. Pt just received breakfast tray. Asked pt to drink carton of OJ and begin eating breakfast. Then will recheck CBG

## 2021-02-09 NOTE — Progress Notes (Signed)
  Echocardiogram 2D Echocardiogram has been performed.  Merrie Roof F 02/09/2021, 4:56 PM

## 2021-02-09 NOTE — Consult Note (Signed)
Consultation Note Date: 02/09/2021   Patient Name: [name] James  DOB: [birth year]  MRN: [id number]  Age / Sex: 85 y.o., female  PCP: [facility] [place] Referring Physician: Emeterio Reeve, DO  Reason for Consultation: Establishing goals of care  HPI/Patient Profile: 85 y.o. female  with past medical history of ESRD on HD, CHF, anemia of chronic disease, chronic diarrhea, DM, and atrial fibrillation presented to the ED on 02/08/21 from [facility] with complaints of weakness and N/V/D. She has had several truncated and missed HD treatments over the last week due to feeling ill and diarrhea issues. Patient follows with GI outpatient for GI symptoms. Patient was admitted on 02/08/2021 with fluid volume overload secondary to not having full dialysis treatments over the last week. Emergent dialysis is not required.  ED Course:  Brought to ED via EMS.  Evaluation for sepsis/source of infection, no significant concerns noted for infection.  Labs consistent with relatively stable chronic medical conditions, including end-stage renal disease, hypoalbuminemia, above normal BNP but decreased from previous, stable/slightly improved anemia, relatively stable pleural effusion.  Physical exam shows significant lower extremity edema which daughter states is well above patient's baseline.  Patient does endorse some shortness of breath over the last week or 2 a bit worse, mild cough but no fever.  Concern for need for dialysis in inpatient setting  Clinical Assessment and Goals of Care: I have reviewed medical records including EPIC notes, labs, and imaging. Received report from primary RN - no acute concerns.   Went to visit patient at bedside - no family/visitors present. Patient was lying in bed awake, alert, oriented, and able to participate in conversation. She complains of 10/10 pain on her sacrum. Placed pillow to relieve  pressure. She denies shortness of breath, but does state she is very tired and does not have an appetite. No respiratory distress, increased work of breathing, or secretions noted. She is on 2L O2 [place].  Met with patient  to discuss diagnosis, prognosis, GOC, EOL wishes, disposition, and options.  I introduced Palliative Medicine as specialized medical care for people living with serious illness. It focuses on providing relief from the symptoms and stress of a serious illness. The goal is to improve quality of life for both the patient and the family. The patient is very open to PMT discussions and visit today.   We discussed a brief life review of the patient as well as functional and nutritional status. She is not currently married. [name] James has 3 children - 2 sons and 1 daughter. Prior to hospitalization, [name] James tells me she was living at [facility], where she has been living for 7 years. The patient is unable to ambulate or stand - she requires the use of hoyer lift to transfer to wheelchair. Staff at [facility] assist her with dressing and bathing. The patient tells me she has had a poor appetite lately and recognizes she has not been eating well. She describes her decline in health as "off and on" over the last  7 years, since she started dialysis.   We discussed patient's current illness and what it means in the larger context of patient's on-going co-morbidities. [name] James understands that ESRD and CHF are unfortunately progressive, non-curable diseases. Natural disease trajectory and expectations at EOL were discussed. I attempted to elicit values and goals of care important to the patient. The difference between aggressive medical intervention and comfort care was considered in light of the patient's goals of care. At this time, [name] James is clear that her goal is to continue dialysis treatments for as long as they are recommended. We discussed there may become a time in the near  future where she would no longer be able to tolerate dialysis treatments and it would be important to prepare for that time. The patient tells me she is at peace for when that time comes and is not afraid of death. I briefly introduced hospice services and philosophy - educated they would be a wonderful support and resource for her when the time comes to stop treatments.   Advance directives, concepts specific to code status, and rehospitalization were considered and discussed. [name] James understands she is at high risk for rehospitalization. She does not have HCPOA, but states that she would want her daughter/[name] James to make medical decisions on her behalf if ever needed. Encouraged patient to consider DNR/DNI status understanding evidenced based poor outcomes in similar hospitalized patient, as the cause of arrest is likely associated with advanced chronic/terminal illness rather than an easily reversible acute cardio-pulmonary event. I explained that DNR/DNI does not change the medical plan and it only comes into effect after a person has arrested (died).  It is a protective measure to keep [place] from harming the patient in their last moments of life. Patient expresses that she would not want aggressive measures such as CPR, defibrillation, ACLS medications, or intubation. She expresses she wants to pass peacefully and naturally; however, does not want to make final decision without speaking with her daughter. I attempted to call daughter from ED room, but phone did not work. I explained I would call and speak with her daughter after our meeting. [name] James explains she has strong [place] faith, and when it is her time, she is "ready to go and be with the Reita Cliche."  I called daughter/[name] to review conversation had with patient as outlined above, answer questions, and offer support. I introduced Palliative Medicine as specialized medical care for people living with serious illness. It focuses on  providing relief from the symptoms and stress of a serious illness. The goal is to improve quality of life for both the patient and the family. Kenney Houseman quickly expresses frustration that PMT have been consulted "again." Kenney Houseman states that she and the patient are "aware of death and do not want any more reminders of it." She says that PMT visits make the patient anxious. I explained that I had just finished meeting with the patient and had a very good conversation with her, explained she was very open to visit today - offered to review conversation details - [name] declined. [name] states, "we speak life" not "death stuff." I attempted to review differences between Palliative Care vs hospice. Kenney Houseman expresses that Palliative Care is "just something else to keep up with" and "I don't feel like it will give her anything." Kenney Houseman states she wishes for PMT to no longer be a part of the patient's medical care and does not wish for any future involvement. She states "if I need  you, I will contact you."  I went back to visit with Ms. Heiberger after speaking with Kenney Houseman - explained that Kenney Houseman was not willing to discuss or speak with me today and wishes for PMT to not be involved in her care any longer. Ms. Calandra states "she has to learn I cannot live forever." I again reviewed code status and encouraged her to consider DNR/DNI per her wishes; however, Ms. Molter is not willing to change code status today without speaking with her daughter. I encouraged her to continue open discussions around this topic. We also reviewed MOST form and I offered to update any information today if goals had changed - the patient stated to leave everything as is and she would speak with her daughter at a later time.   Offered chaplain services - patient kindly declined.  Discussed with patient/family the importance of continued conversation with each other and the medical providers regarding overall plan of care and treatment options, ensuring  decisions are within the context of the patient's values and GOCs.    Questions and concerns were addressed. The patient/family was encouraged to call with questions and/or concerns. PMT card was provided to patient.   Primary Decision Maker: PATIENT - she would like her daughter/[name] James involved in her care and with decision making    SUMMARY OF RECOMMENDATIONS:  Continue full scope medical treatment  Patient wishes to continue dialysis treatments until they are no longer offered  Patient expresses desire for DNR/DNI, but does not want to change code status until she continues conversation with her daughter and daughter agrees - continue full code status for now  Restarted patient's home med: tramadol 50-182m q6hr PRN pain  Patient does not want to make final decisions about her care without her daughter; however, daughter does not wish to speak with or have PMT involved in patient's care  PMT will sign off. Please re-consult if daughter/[name] wishes [place] be involved and PMT would be happy to re-engage.  Code Status/Advance Care Planning:  Full code   Palliative Prophylaxis:   Aspiration, Bowel Regimen, Delirium Protocol, Frequent Pain Assessment, Oral Care and Turn Reposition  Additional Recommendations (Limitations, Scope, Preferences):  Full Scope Treatment  Psycho-social/Spiritual:   Desire for further Chaplaincy support:no Created space and opportunity for patient and family to express thoughts and feelings regarding patient's current medical situation.   Emotional support and therapeutic listening provided.  Prognosis:   Unable to determine - poor in the setting of advanced age, deconditioning, poor PO intake, and and multiple chronic illnesses  [facility]     Primary Diagnoses: Present on Admission: . Edema . Essential hypertension . DM type 2, uncontrolled, with renal complications ([place] . Gastritis and  gastroduodenitis . Pulmonary edema . Physical deconditioning . Anemia, chronic disease . Chronic diastolic (congestive) heart failure (HDaytona Beach Shores . Paroxysmal atrial fibrillation (HCC) . Hypoalbuminemia due to protein-calorie malnutrition ([place]win Bridges   I have reviewed the medical record, interviewed the patient and family, and examined the patient. The following aspects are pertinent.  Past Medical History:  Diagnosis Date  . Abdominal pain   . Anemia, chronic disease   . Anxiety   . Aortic atherosclerosis (HCambria   . Arthritis    knees  . Atrial fibrillation ([place]Canon   . Barrett esophagus   . Benign paroxysmal positional vertigo   . Breast cancer ([place] 1980s   bil mastectomies, no radiation or chemo  . Cataract   . Chest pain, atypical  12/05/2008   R/Lmv- normal perfusion all regions, noe ECG changes   . CHF (congestive heart failure) ([place]) 05/13/2011   echo - ZO>10%; stage 1 diastolic dysfunction; elevated LV filling pressure, MAC  . CKD (chronic kidney disease), stage III ([place])   . Claudication ([place]) 10/30/2005   doppler - normal evaluation, no evidence of aneurysm, diameter reduction, dissection, compression or vascular abnormality)  . Complication of anesthesia   . Controlled type 2 diabetes mellitus with diabetic polyneuropathy, with long-term current use of insulin ([place]) 04/17/2016  . Dyslipidemia   . Dysrhythmia    Atrial Fibrillation  . Edema   . ESRD (end stage renal disease) on dialysis ([place])    "[facility]; [address]; MWF" (07/06/2018)  . Family history of adverse reaction to anesthesia    son also has nausea   . Fever blister 12/2016  . Gastritis and gastroduodenitis   . GERD (gastroesophageal reflux disease)   . Gout   . Hemodialysis-associated hypotension   . HLD (hyperlipidemia)   . Hypertension   . IBS (irritable bowel syndrome)   . Insomnia   . Iron deficiency anemia   . Junctional bradycardia 08/25/2018  . LFT elevation   . Long term current use of amiodarone  10/17/2016  . Morbid (severe) obesity due to excess calories ([place]) 04/29/2017  . Morbid obesity ([place])   . Nausea & vomiting 10/2020  . On home oxygen therapy    "2L; 24/7" (07/06/2018)  . PAF (paroxysmal atrial fibrillation) ([place])   . Peripheral neuropathy   . Pneumonia 12/2016  . PONV (postoperative nausea and vomiting)   . Renal cyst   . Renal insufficiency 10/30/2005   doppler - abn resistance consistent w/ parenchymal disease  . Renovascular hypertension   . Type 2 diabetes mellitus with diabetic neuropathy, with long-term current use of insulin ([place])   . Vertigo    Social History   Socioeconomic History  . Marital status: Divorced    Spouse name: Not on file  . Number of children: Not on file  . Years of education: Not on file  . Highest education level: Not on file  Occupational History  . Not on file  Tobacco Use  . Smoking status: Never Smoker  . Smokeless tobacco: Never Used  Vaping Use  . Vaping Use: Never used  Substance and Sexual Activity  . Alcohol use: No  . Drug use: No  . Sexual activity: Not on file  Other Topics Concern  . Not on file  Social History Narrative   [facility] (10/06/17):   Originally from [place]. Has always lived in [place]. She currently lives in [place]. No bird exposure. [place] Previously traveled to [place], [place], & [place]. Previously worked in a nursery and also a Pharmacist, community.    Social Determinants of Health   Financial Resource Strain: Not on file  Food Insecurity: Not on file  Transportation Needs: Not on file  Physical Activity: Not on file  Stress: Not on file  Social Connections: Not on file   Family History  Problem Relation Age of Onset  . Diabetes Mother   . Heart disease Mother   . Non-Hodgkin's lymphoma Mother   . Heart attack Father   . Stroke Father   . Parkinson's disease Brother   . Lung disease Brother   . Heart disease Brother   . Multiple myeloma Sister    Scheduled Meds: . (feeding  supplement) PROSource Plus  30 mL Oral BID  BM  . allopurinol  100 mg Oral Daily  . amiodarone  200 mg Oral Daily  . brimonidine  1 drop Both Eyes BID  . brinzolamide  1 drop Both Eyes BID  . calcium carbonate  1 tablet Oral BID  . Chlorhexidine Gluconate Cloth  6 each Topical Q0600  . colestipol  1 g Oral Daily  . diclofenac Sodium  2 g Topical QHS  . dimethicone   Topical BID  . feeding supplement (NEPRO CARB STEADY)  237 mL Oral TID BM  . fluticasone  2 spray Each Nare Q12H  . heparin  5,000 Units Subcutaneous Q8H  . insulin aspart  0-6 Units Subcutaneous TID WC  . loratadine  10 mg Oral Daily  . melatonin  10 mg Oral QHS  . midodrine  2.5 mg Oral TID WC  . pantoprazole  40 mg Oral BID  . sodium chloride flush  3 mL Intravenous Q12H  . traZODone  50 mg Oral QHS   Continuous Infusions: . sodium chloride     PRN Meds:.sodium chloride, acetaminophen **OR** acetaminophen, calcium carbonate (dosed in mg elemental calcium), camphor-menthol **AND** [DISCONTINUED] hydrOXYzine, diphenoxylate-atropine, loperamide, ondansetron **OR** ondansetron (ZOFRAN) IV, promethazine, sodium chloride flush, sorbitol, traMADol Medications Prior to Admission:  Prior to Admission medications   Medication Sig Start Date End Date Taking? Authorizing Provider  acetaminophen (TYLENOL) 325 MG tablet Take 650 mg by mouth every 6 (six) hours as needed for fever (greater than 100 F).    Yes [provider]  allopurinol (ZYLOPRIM) 100 MG tablet Take 100 mg by mouth daily.    Yes [provider]  amiodarone (PACERONE) 200 MG tablet Take 2 tablets (430m) daily for 7 days. Thereafter take 1 tablet (2055m daily. Patient taking differently: Take 200 mg by mouth daily. 08/10/20  Yes Mikhail, MaVelta AddisonDO  ascorbic acid (HM VITAMIN C) 500 MG tablet Take 500 mg by mouth daily.    Yes [provider]  aspirin 81 MG chewable tablet Chew 81 mg by mouth daily.    Yes [provider]  b  complex-vitamin c-folic acid (NEPHRO-VITE) 0.8 MG TABS tablet Take 1 tablet by mouth daily.    Yes [provider]  Brinzolamide-Brimonidine 1-0.2 % SUSP Place 1 drop into both eyes 2 (two) times daily.    Yes [provider]  calcium carbonate (TUMS - DOSED IN MG ELEMENTAL CALCIUM) 500 MG chewable tablet Chew 1 tablet by mouth 2 (two) times daily.   Yes [provider]  cetirizine (ZYRTEC) 10 MG tablet Take 10 mg by mouth daily.   Yes [provider]  colestipol (COLESTID) 1 g tablet Take 1 g by mouth daily. 02/05/21  Yes [provider]  diazepam (VALIUM) 5 MG tablet Take 1 tablet (5 mg total) by mouth at bedtime. 11/02/20  Yes [place]Eric J, DO  diclofenac Sodium (VOLTAREN) 1 % GEL Apply 2 g topically See admin instructions. Apply 2 grams to the right knee at bedtime for pain 06/04/20  Yes [provider]  DIMETHICONE, TOPICAL, (SECURA DIMETHICONE PROTECTANT) 5 % CREA Apply 1 application topically See admin instructions. Apply a nickel-sized amount onto the buttocks area 2 times a day for barrier protection   Yes [provider]  diphenoxylate-atropine (LOMOTIL) 2.5-0.025 MG tablet Take 1 tablet by mouth every 6 (six) hours as needed for diarrhea or loose stools. Take 1 tablet by mouth every 6 hours PRN for diarrhea or loose stools   Yes [provider]  diphenoxylate-atropine (LOMOTIL) 2.5-0.025 MG tablet Take 1 tablet by mouth See admin instructions. Monday,Wednesday and Friday prior to hemodialysis   Yes [provider]  fluticasone (FLONASE) 50 MCG/ACT nasal spray Place 2 sprays into both nostrils every 12 (twelve) hours.    Yes [provider]  ipratropium (ATROVENT) 0.03 % nasal spray Place 2 sprays into both nostrils every 12 (twelve) hours. 08/02/20  Yes Hunsucker, Bonna Gains, MD  loperamide (IMODIUM A-D) 2 MG tablet Take 2 tablets (4 mg total) by mouth every 8 (eight) hours as needed for diarrhea or loose  stools (for diarrhea). Patient taking differently: Take 4 mg by mouth See admin instructions. Take 2 tablets (4 mg) Daily on MWF; May have 2 tablets (4 mg) prior to Hemodialysis 03/01/19  Yes Pyrtle, Lajuan Lines, MD  Melatonin 5 MG CAPS Take 10 mg by mouth at bedtime.    Yes [provider]  Menthol, Topical Analgesic, (BIOFREEZE) 4 % GEL Apply 1 application topically 3 (three) times daily as needed (bilateral knee pain).   Yes [provider]  midodrine (PROAMATINE) 10 MG tablet Take 10 mg by mouth See admin instructions. Take 1 tablet (10 mg) on MWF for BP Support At Dialysis 09/14/20  Yes [provider]  midodrine (PROAMATINE) 2.5 MG tablet Take 2.5 mg by mouth 3 (three) times daily with meals. Give if BP<120/60   Yes [provider]  OXYGEN Inhale 2 L into the lungs continuous.    Yes [provider]  pantoprazole (PROTONIX) 40 MG tablet Take 1 tablet (40 mg total) by mouth 2 (two) times daily. 11/02/20 12/02/20 Yes [place] Eric J, DO  polyethylene glycol (MIRALAX / GLYCOLAX) 17 g packet Take 17 g by mouth daily as needed for moderate constipation. 11/02/20  Yes [place] Donnamarie Poag, DO  promethazine (PHENERGAN) 12.5 MG tablet Take 12.5 mg by mouth 2 (two) times daily as needed for nausea/vomiting. 02/05/21  Yes [provider]  SALINE NASAL MIST NA Place 2 sprays into the nose 3 (three) times daily.   Yes [provider]  sodium phosphate (FLEET) 7-19 GM/118ML ENEM Place 1 enema rectally daily as needed for severe constipation.   Yes [provider]  traMADol (ULTRAM) 50 MG tablet Take 1-2 tablets (50-100 mg total) by mouth See admin instructions. Take 1 tablet (50 mg totally) by mouth every 6 hours as needed for mild - moderate pain; take 2 tablets (154m totally) by mouth as needed for moderate - severe pain 11/02/20  Yes [place] EDonnamarie Poag DO  traZODone (DESYREL) 50 MG tablet Take 1 tablet (50 mg total) by mouth at bedtime. 11/02/20  12/02/20 Yes [place] Eric J, DO  collagenase (SANTYL) ointment Apply 1 application topically daily. Open ulcer to left posterior flank wound bed Patient not taking: Reported on 02/08/2021    [provider]  promethazine (PHENERGAN) 25 MG tablet Take 25 mg by mouth 2 (two) times daily as needed for nausea.  Patient not taking: Reported on 02/08/2021 08/24/20   [provider]   Allergies  Allergen Reactions  . Erythromycin Swelling  . Codeine Palpitations, Rash and Other (See Comments)    GI Upset  . Erythromycin Base Other (See Comments)    GI Upset  . Penicillins Rash and Other (See Comments)    Has taken Keflex & Rocephin many times without problems Documented on MAR  . Zantac [Ranitidine Hcl] Nausea And Vomiting   Review of Systems  Constitutional: Positive for fatigue.  Respiratory: Negative for  shortness of breath.   Gastrointestinal: Positive for diarrhea. Negative for nausea.  Neurological: Positive for weakness.  All other systems reviewed and are negative.   Physical Exam Vitals and nursing note reviewed.  Constitutional:      General: She is not in acute distress.    Appearance: She is ill-appearing.     Comments: Frail appearing  Pulmonary:     Effort: No respiratory distress.  Skin:    General: Skin is warm and dry.  Neurological:     Mental Status: She is alert and oriented to person, place, and time.     Motor: Weakness present.  Psychiatric:        Attention and Perception: Attention normal.        Behavior: Behavior is cooperative.        Cognition and Memory: Cognition and memory normal.     Vital Signs: BP (!) 91/48 (BP Location: Left Arm)   Pulse 66   Temp 97.8 F (36.6 C) (Oral)   Resp 16   SpO2 97%  Pain Scale: 0-10   Pain Score: 5    SpO2: SpO2: 97 % O2 Device:SpO2: 97 % O2 Flow Rate: .O2 Flow Rate (L/min): 2 L/min  IO: Intake/output summary:   Intake/Output Summary (Last 24 hours) at 02/09/2021 1513 Last data filed  at 02/09/2021 0030 Gross per 24 hour  Intake 100 ml  Output --  Net 100 ml    LBM:   Baseline Weight:   Most recent weight:       Palliative Assessment/Data: 30%     Time In: 1530  Time Out: 1715  Time Total: 105 minutes  Greater than 50%  of this time was spent counseling and coordinating care related to the above assessment and plan.  Signed by: Lin Landsman, NP   Please contact Palliative Medicine Team phone at [phone] for questions and concerns.  For individual provider: See Shea Evans

## 2021-02-09 NOTE — Progress Notes (Signed)
[facility] PROGRESS NOTE   KARMON ANDIS SHF:[id number] DOB: 10-17-1936 DOA: 02/08/2021  PCP: [facility] [place]  Brief History/Interval Summary: 85 y.o. female with medical history significant of ESRD on HD MWF, diastolic CHF, anemia of chronic disease.  Patient has had chronic diarrhea with adjustments made to her treatment by her gastroenterologist, Dr. Elmo Putt.  As result of her persistent diarrhea patient has had to cut short her dialysis treatments.  This has occurred on numerous occasions.  Patient presented mainly with the generalized weakness persistent diarrhea having missed dialysis and worsening edema in the lower extremities.  She was noted to be fluid overloaded.  She was hospitalized for further management.   Consultants: Nephrology  Procedures: None yet  Antibiotics: Anti-infectives (From admission, onward)   None      Subjective/Interval History: Patient symptoms: Lethargic though easily arousable.  Poor historian.  Denies any chest pain or shortness of breath currently.  She tells me that she uses oxygen at home around-the-clock.  Denies any nausea vomiting currently.  Has not had any diarrhea since yesterday.  This was verified with nursing staff.    Assessment/Plan:  End-stage renal disease on hemodialysis, Monday Wednesday Friday schedule/fluid overload Patient noted to have significant lower extremity edema.  Imaging study showed bilateral pleural effusion.  However she is not overtly symptomatic from a respiratory standpoint currently.  She does have evidence for volume overload currently.  This is likely due to not having had full dialysis sessions for the past several occasions.  This was mainly due to her GI symptoms.  Case discussed with Dr. Posey Pronto with nephrology who will consult.  Electrolytes noted to be normal.  Chronic diarrhea Followed by Dr. Hilarie Fredrickson with gastroenterology.  Looks like she was recently started on colestipol.  Also on Lomotil and  Imodium.  Patient does not report having had any diarrhea in the last 24 hours.  We will continue to watch for now.  If she has recurrence of her symptoms we may need to involve her gastroenterologist.  Chronic nausea and vomiting Does not report any of the symptoms currently.  Continue to monitor.  Anemia of chronic kidney disease No evidence of overt bleeding.  Monitor hemoglobin.  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  Likely due to poor oral intake.  She is noted to be on SSI.  Not noted to be on any other glucose lowering agents.  Continue to monitor.  Encourage oral intake.  Elevated blood pressure Continue to monitor for now.  Will not be too aggressive considering any her age as well as the fact that she is on dialysis.  She is noted to be on midodrine suggesting perhaps that she usually is hypotensive.  Pleural effusions Moderate bilateral pleural effusion was noted on chest x-ray.  This is most likely due to fluid overload.  Patient has had short treatments of dialysis due to her GI symptoms.  Likely reason for these effusions.  Respiratory status is stable.  No need for urgent thoracentesis.  These fluid collection should improve as she is dialyzed.  History of atrial fibrillation Noted to be on amiodarone which is being continued.  Not noted to be on anticoagulation.  History of gout Continue allopurinol.  History of generalized anxiety and insomnia Holding sedative agents.  History of cognitive impairment Chronic microvascular disease noted on CT brain.  Seems to be somewhat distracted but does not have any focal deficits.  Glaucoma Continue eyedrops.  History of pressure injuries Preventive care. Pressure Injury 08/03/20 Sacrum Stage 2 -  Partial thickness loss of dermis presenting as a shallow open injury with a red, pink wound bed without slough. (Active)  08/03/20 2230  Location: Sacrum   Location Orientation:   Staging: Stage 2 -  Partial thickness loss of dermis presenting as a shallow open injury with a red, pink wound bed without slough.  Wound Description (Comments):   Present on Admission: Yes    Goals of care Palliative care has been consulted.  DVT Prophylaxis: Subcutaneous heparin Code Status: Full code Family Communication: No family at bedside currently Disposition Plan: From a skilled nursing facility  Status is: Inpatient  Remains inpatient appropriate because:IV treatments appropriate due to intensity of illness or inability to take PO and Inpatient level of care appropriate due to severity of illness   Dispo: The patient is from: SNF              Anticipated d/c is to: SNF              Anticipated d/c date is: 3 days              Patient currently is not medically stable to d/c.   Difficult to place patient No      Medications:  Scheduled: . allopurinol  100 mg Oral Daily  . amiodarone  200 mg Oral Daily  . brimonidine  1 drop Both Eyes BID  . brinzolamide  1 drop Both Eyes BID  . calcium carbonate  1 tablet Oral BID  . colestipol  1 g Oral Daily  . diclofenac Sodium  2 g Topical QHS  . DIMETHICONE (TOPICAL)  1 application Apply externally See admin instructions  . fluticasone  2 spray Each Nare Q12H  . heparin  5,000 Units Subcutaneous Q8H  . insulin aspart  0-6 Units Subcutaneous TID WC  . loratadine  10 mg Oral Daily  . melatonin  10 mg Oral QHS  . midodrine  2.5 mg Oral TID WC  . pantoprazole  40 mg Oral BID  . sodium chloride flush  3 mL Intravenous Q12H  . traZODone  50 mg Oral QHS   Continuous: . sodium chloride     KGU:RKYHCW chloride, acetaminophen **OR** acetaminophen, calcium carbonate (dosed in mg elemental calcium), camphor-menthol **AND** [DISCONTINUED] hydrOXYzine, diphenoxylate-atropine, feeding supplement (NEPRO CARB STEADY), loperamide, ondansetron **OR** ondansetron (ZOFRAN) IV, promethazine, sodium chloride  flush, sorbitol   Objective:  Vital Signs  Vitals:   02/09/21 0100 02/09/21 0409 02/09/21 0559 02/09/21 0800  BP: (!) 150/48 (!) 148/58 (!) 163/55 (!) 159/68  Pulse: 71 66 66 68  Resp: 15 14 14 15   Temp:   97.9 F (36.6 C)   TempSrc:   Oral   SpO2: 100% 100% 100% 100%    Intake/Output Summary (Last 24 hours) at 02/09/2021 0915 Last data filed at 02/09/2021 0030 Gross per 24 hour  Intake 100 ml  Output --  Net 100 ml   There were no vitals filed for this visit.   General appearance: Awake alert.  In no distress.  Distracted Resp: Diminished air entry at the bases without any wheezing or rhonchi.  Few crackles. Cardio: S1-S2 is normal regular.  No S3-S4.  No rubs murmurs or bruit GI: Abdomen is soft.  Nontender nondistended.  Bowel sounds are present normal.  No masses organomegaly Extremities: Bilateral lower extremity edema is noted.  Poor range of motion due to generalized deconditioning Neurologic:  No  focal neurological deficits.    Lab Results:  Data Reviewed: I have personally reviewed following labs and imaging studies  CBC: Recent Labs  Lab 02/08/21 1754 02/09/21 0456  WBC 8.9 7.7  NEUTROABS 6.8 4.9  HGB 11.8* 10.6*  HCT 38.4 32.6*  MCV 101.6* 100.3*  PLT 123* 121*    Basic Metabolic Panel: Recent Labs  Lab 02/08/21 1754 02/09/21 0456  NA 140 141  K 4.6 4.3  CL 103 101  CO2 25 27  GLUCOSE 80 78  BUN 23 23  CREATININE 3.54* 3.91*  CALCIUM 8.0* 7.8*    GFR: CrCl cannot be calculated (Unknown ideal weight.).  Liver Function Tests: Recent Labs  Lab 02/08/21 1754 02/09/21 0456  AST 31 18  ALT 18 15  ALKPHOS 66 54  BILITOT 0.7 0.3  PROT 5.3* 4.6*  ALBUMIN 2.0* 1.6*     HbA1C: Recent Labs    02/08/21 2344  HGBA1C 4.6*    CBG: Recent Labs  Lab 02/08/21 1804 02/09/21 0801 02/09/21 0831 02/09/21 0854  GLUCAP 76 52* 53* 153*      Recent Results (from the past 240 hour(s))  SARS CORONAVIRUS 2 (TAT 6-24 HRS)  Nasopharyngeal Nasopharyngeal Swab     Status: None   Collection Time: 02/08/21 10:07 PM   Specimen: Nasopharyngeal Swab  Result Value Ref Range Status   SARS Coronavirus 2 NEGATIVE NEGATIVE Final    Comment: (NOTE) SARS-CoV-2 target nucleic acids are NOT DETECTED.  The SARS-CoV-2 RNA is generally detectable in upper and lower respiratory specimens during the acute phase of infection. Negative results do not preclude SARS-CoV-2 infection, do not rule out co-infections with other pathogens, and should not be used as the sole basis for treatment or other patient management decisions. Negative results must be combined with clinical observations, patient history, and epidemiological information. The expected result is Negative.  Fact Sheet for Patients: SugarRoll.be  Fact Sheet for Healthcare Providers: https://www.woods-mathews.com/  This test is not yet approved or cleared by the [place] FDA and  has been authorized for detection and/or diagnosis of SARS-CoV-2 by FDA under an Emergency Use Authorization (EUA). This EUA will remain  in effect (meaning this test can be used) for the duration of the COVID-19 declaration under Se ction 564(b)(1) of the Act, 21 U.S.C. section 360bbb-3(b)(1), unless the authorization is terminated or revoked sooner.  Performed at [facility], [place] [address] [place], [place] [address]       Radiology Studies: DG Chest 2 View  Result Date: 02/08/2021 CLINICAL DATA:  Dyspnea, missed dialysis EXAM: CHEST - 2 VIEW COMPARISON:  10/29/2020, CT 10/29/2020 FINDINGS: Left-sided central venous catheter with tips over the right atrium. Moderate bilateral pleural effusions and basilar airspace disease. Cardiomediastinal silhouette within normal limits. Aortic atherosclerosis. No pneumothorax. IMPRESSION: Moderate bilateral pleural effusions with basilar airspace disease. Electronically Signed   By: Donavan Foil M.D.   On: 02/08/2021 18:37   CT Head Wo Contrast  Result Date: 02/08/2021 CLINICAL DATA:  Weakness for 1 week, nausea, vomiting and diarrhea EXAM: CT HEAD WITHOUT CONTRAST TECHNIQUE: Contiguous axial images were obtained from the base of the skull through the vertex without intravenous contrast. COMPARISON:  CT 09/14/2020 FINDINGS: Brain: No evidence of acute infarction, hemorrhage, hydrocephalus, visible mass lesion or mass effect. Diffuse prominence of the ventricles, cisterns and sulci compatible with parenchymal volume loss. A more focal region of extra-axial CSF attenuation across the left frontal lobe is unchanged from prior and may be related to  some asymmetric volume loss versus a chronic subdural/hygroma without acute hemorrhagic component. Patchy areas of white matter hypoattenuation are most compatible with chronic microvascular angiopathy. Vascular: Scattered vascular calcifications noted across the carotid siphons and intradural vertebral arteries as well as additional more superficial vascular calcifications seen along the right cerebral convexity as well, unchanged from prior. No worrisome hyperdense vessel or unexpected calcification is seen. Skull: The osseous structures appear diffusely demineralized which may limit detection of small or nondisplaced fractures. Hyperostosis frontalis interna is a benign, often incidental finding. No acute fracture or worrisome osseous lesion. No scalp swelling or hematoma. Some calcific pannus formation noted posterior to the dens. Sinuses/Orbits: Paranasal sinuses and mastoid air cells are predominantly clear. Orbital structures are unremarkable aside from prior lens extractions. Other: None. IMPRESSION: 1. No acute intracranial abnormality. 2. Stable parenchymal volume loss and chronic microvascular angiopathy. 3. Stable appearance of a more focal region of extra-axial CSF attenuation across the left frontal convexity possibly asymmetric volume loss  versus a chronic subdural/hygroma without acute hemorrhagic component. 4. Osseous structures appear diffusely demineralized which may limit detection of small or nondisplaced fractures. Electronically Signed   By: [name] M.D.   On: 02/08/2021 19:42   DG Abd 2 Views  Result Date: 02/08/2021 CLINICAL DATA:  Dyspnea EXAM: ABDOMEN - 2 VIEW COMPARISON:  CT 01/22/2016 FINDINGS: Bilateral pleural effusions and basilar airspace disease. Limited by habitus and positioning. No gross free air. Nonobstructed gas pattern. Extensive vascular calcification. Numerous pelvic calcifications presumably phleboliths. IMPRESSION: Nonobstructed gas pattern. Bilateral pleural effusions and basilar airspace disease. Electronically Signed   By: Donavan Foil M.D.   On: 02/08/2021 18:39       LOS: 1 day   [facility] Pager on [email or web address]  02/09/2021, 9:15 AM

## 2021-02-09 NOTE — Progress Notes (Signed)
Pt did 3 hours hemodialysis and goal is not met d/t Pt had hypotension during tx.Pt keep even no fluids take off. Dr. Posey Pronto notified.

## 2021-02-09 NOTE — Consult Note (Signed)
[facility] Renal Consultation Note    Indication for Consultation:  Management of ESRD/hemodialysis; anemia, hypertension/volume and secondary hyperparathyroidism  HPI: [name] James is a 85 y.o. female with ESRD on HD MWF, PAFib,  CHF, DMT2, HTN, HLD, chronic diarrhea.  She is admitted with generalized weakness, GI upset. She has been unable to complete her dialysis treatments this week d/t feeling poorly. VSS on arrival. Labs WNL for ESRD patient. No acute findings on head CT. Moderate bilateral effusions on CXR.   Dialyzes at [facility] MWF. Dialysis via [facility]. Last dialysis was yesterday for 1 hour. Left 3.6kg over dry weight. All 3 treatments this week have been under 2 hours.   Seen and examined at bedside. Tells me "I have a lot of problems" but she has been having loose stools that make it uncomfortable for her to stay on dialysis "just had to get out of there"  Feels fatigued. She is agreeable to having dialysis today, she feels like she has a lot of fluid on and wants to try to get it off. Denies f, c, cp, sob at rest.   Past Medical History:  Diagnosis Date  . Abdominal pain   . Anemia, chronic disease   . Anxiety   . Aortic atherosclerosis ([place])   . Arthritis    knees  . Atrial fibrillation ([place])   . Barrett esophagus   . Benign paroxysmal positional vertigo   . Breast cancer ([place]) 1980s   bil mastectomies, no radiation or chemo  . Cataract   . Chest pain, atypical 12/05/2008   R/Lmv- normal perfusion all regions, noe ECG changes   . CHF (congestive heart failure) ([place]) 05/13/2011   echo - AG>53%; stage 1 diastolic dysfunction; elevated LV filling pressure, MAC  . CKD (chronic kidney disease), stage III ([place])   . Claudication ([place]) 10/30/2005   doppler - normal evaluation, no evidence of aneurysm, diameter reduction, dissection, compression or vascular abnormality)  . Complication of anesthesia   . Controlled type 2 diabetes mellitus with  diabetic polyneuropathy, with long-term current use of insulin ([place]) 04/17/2016  . Dyslipidemia   . Dysrhythmia    Atrial Fibrillation  . Edema   . ESRD (end stage renal disease) on dialysis ([place])    "[facility]; [address]; MWF" (07/06/2018)  . Family history of adverse reaction to anesthesia    son also has nausea   . Fever blister 12/2016  . Gastritis and gastroduodenitis   . GERD (gastroesophageal reflux disease)   . Gout   . Hemodialysis-associated hypotension   . HLD (hyperlipidemia)   . Hypertension   . IBS (irritable bowel syndrome)   . Insomnia   . Iron deficiency anemia   . Junctional bradycardia 08/25/2018  . LFT elevation   . Long term current use of amiodarone 10/17/2016  . Morbid (severe) obesity due to excess calories ([place]) 04/29/2017  . Morbid obesity ([place])   . Nausea & vomiting 10/2020  . On home oxygen therapy    "2L; 24/7" (07/06/2018)  . PAF (paroxysmal atrial fibrillation) ([place])   . Peripheral neuropathy   . Pneumonia 12/2016  . PONV (postoperative nausea and vomiting)   . Renal cyst   . Renal insufficiency 10/30/2005   doppler - abn resistance consistent w/ parenchymal disease  . Renovascular hypertension   . Type 2 diabetes mellitus with diabetic neuropathy, with long-term current use of insulin ([place])   . Vertigo    Past Surgical History:  Procedure Laterality Date  .  A/V FISTULAGRAM Left 03/19/2018   Procedure: A/V FISTULAGRAM;  Surgeon: Angelia Mould, MD;  Location: [place] CV LAB;  Service: Cardiovascular;  Laterality: Left;  . A/V FISTULAGRAM N/A 12/24/2018   Procedure: A/V FISTULAGRAM - Right Arm;  Surgeon: Angelia Mould, MD;  Location: [place] CV LAB;  Service: Cardiovascular;  Laterality: N/A;  . A/V FISTULAGRAM Right 03/29/2019   Procedure: A/V FISTULAGRAM;  Surgeon: Angelia Mould, MD;  Location: [place] CV LAB;  Service: Cardiovascular;  Laterality: Right;  . A/V FISTULAGRAM Right 02/03/2020   Procedure: A/V  FISTULAGRAM;  Surgeon: Angelia Mould, MD;  Location: [place] CV LAB;  Service: Cardiovascular;  Laterality: Right;  . A/V SHUNTOGRAM Left 01/19/2018   Procedure: A/V SHUNTOGRAM;  Surgeon: Serafina Mitchell, MD;  Location: [place] CV LAB;  Service: Cardiovascular;  Laterality: Left;  . ANGIOPLASTY Left 02/12/2015   Procedure: ANGIOPLASTY;  Surgeon: Angelia Mould, MD;  Location: [facility] CATH LAB;  Service: Cardiovascular;  Laterality: Left;  AVF  . AV FISTULA PLACEMENT Left 11/30/2014   Procedure: ARTERIOVENOUS (AV) FISTULA CREATION LEFT ARM;  Surgeon: Angelia Mould, MD;  Location: [place];  Service: Vascular;  Laterality: Left;  . AV FISTULA PLACEMENT Left 03/20/2015   Procedure:  Inserton of Left Upper Arm Gortex Graft;  Surgeon: Angelia Mould, MD;  Location: [place];  Service: Vascular;  Laterality: Left;  . AV FISTULA PLACEMENT Left 04/12/2015   Procedure: INSERTION OF LEFT ARM  ARTERIOVENOUS GORE-TEX GRAFT ;  Surgeon: Angelia Mould, MD;  Location: [place];  Service: Vascular;  Laterality: Left;  . AV FISTULA PLACEMENT Right 06/08/2018   Procedure: INSERTION OF ARTERIOVENOUS (AV) GORE-TEX GRAFT ARM USING 4-7MM X 45CM GORETEX GRAFT;  Surgeon: Angelia Mould, MD;  Location: [place];  Service: Vascular;  Laterality: Right;  . BACK SURGERY  ~1980  . CATARACT EXTRACTION Right   . CHOLECYSTECTOMY    . ESOPHAGOGASTRODUODENOSCOPY N/A 10/24/2014   Procedure: ESOPHAGOGASTRODUODENOSCOPY (EGD);  Surgeon: Jerene Bears, MD;  Location: [facility] ENDOSCOPY;  Service: Endoscopy;  Laterality: N/A;  . EXCHANGE OF A DIALYSIS CATHETER Left 11/30/2014   Procedure: EXCHANGE OF A DIALYSIS CATHETER, LEFT INTERNAL JUGULAR;  Surgeon: Angelia Mould, MD;  Location: [place];  Service: Vascular;  Laterality: Left;  . EYE SURGERY Left    transplant  . FISTULOGRAM N/A 02/12/2015   Procedure: FISTULOGRAM;  Surgeon: Angelia Mould, MD;  Location: [facility] CATH LAB;  Service: Cardiovascular;   Laterality: N/A;  . HEMORRHOID SURGERY    . IR AV DIALY SHUNT INTRO NEEDLE/INTRACATH INITIAL W/PTA/IMG RIGHT Right 08/25/2018  . IR AV DIALY SHUNT INTRO NEEDLE/INTRACATH INITIAL W/PTA/IMG RIGHT Right 11/17/2018  . IR US GUIDE VASC ACCESS RIGHT  08/25/2018  . LIGATION ARTERIOVENOUS GORTEX GRAFT Left 08/27/2018   Procedure: LIGATION ARTERIOVENOUS GORTEX GRAFT ARM;  Surgeon: Angelia Mould, MD;  Location: [place];  Service: Vascular;  Laterality: Left;  . LIGATION OF ARTERIOVENOUS  FISTULA Left 03/20/2015   Procedure: LIGATION OF ARTERIOVENOUS  FISTULA;  Surgeon: Angelia Mould, MD;  Location: [place];  Service: Vascular;  Laterality: Left;  Marland Kitchen MASTECTOMY Bilateral ~1982   bil breast reconstruction with implants  . ORIF TIBIA & FIBULA FRACTURES Left 2007   also had left non displaced malleolar fracture.   Marland Kitchen PERIPHERAL VASCULAR BALLOON ANGIOPLASTY Left 01/19/2018   Procedure: PERIPHERAL VASCULAR BALLOON ANGIOPLASTY;  Surgeon: Serafina Mitchell, MD;  Location: [place] CV LAB;  Service: Cardiovascular;  Laterality:  Left;  fistula  . PERIPHERAL VASCULAR BALLOON ANGIOPLASTY Left 03/19/2018   Procedure: PERIPHERAL VASCULAR BALLOON ANGIOPLASTY;  Surgeon: Angelia Mould, MD;  Location: [place] CV LAB;  Service: Cardiovascular;  Laterality: Left;  UPPER ARM FISTULA  . PERIPHERAL VASCULAR BALLOON ANGIOPLASTY Right 12/24/2018   Procedure: PERIPHERAL VASCULAR BALLOON ANGIOPLASTY;  Surgeon: Angelia Mould, MD;  Location: [place] CV LAB;  Service: Cardiovascular;  Laterality: Right;  arm fistula  . PERIPHERAL VASCULAR BALLOON ANGIOPLASTY  03/29/2019   Procedure: PERIPHERAL VASCULAR BALLOON ANGIOPLASTY;  Surgeon: Angelia Mould, MD;  Location: [place] CV LAB;  Service: Cardiovascular;;  . PERIPHERAL VASCULAR BALLOON ANGIOPLASTY Right 02/03/2020   Procedure: PERIPHERAL VASCULAR BALLOON ANGIOPLASTY;  Surgeon: Angelia Mould, MD;  Location: [place] CV LAB;  Service:  Cardiovascular;  Laterality: Right;  upper arm fistula  . PERIPHERAL VASCULAR BALLOON ANGIOPLASTY  06/22/2020   Procedure: PERIPHERAL VASCULAR BALLOON ANGIOPLASTY;  Surgeon: Angelia Mould, MD;  Location: [place] CV LAB;  Service: Cardiovascular;;  Right AVF  . REMOVAL OF GRAFT Left 04/12/2015   Procedure: REMOVAL OF LEFT ARM ARTERIOVENOUS GORE-TEX GRAFT;  Surgeon: Angelia Mould, MD;  Location: [place];  Service: Vascular;  Laterality: Left;  . REVISION OF ARTERIOVENOUS GORETEX GRAFT Right 12/13/2019   Procedure: REVISION OF ARTERIOVENOUS GORETEX GRAFT RIGHT FOREARM;  Surgeon: Angelia Mould, MD;  Location: [facility] OR;  Service: Vascular;  Laterality: Right;   Family History  Problem Relation Age of Onset  . Diabetes Mother   . Heart disease Mother   . Non-Hodgkin's lymphoma Mother   . Heart attack Father   . Stroke Father   . Parkinson's disease Brother   . Lung disease Brother   . Heart disease Brother   . Multiple myeloma Sister    Social History:  reports that she has never smoked. She has never used smokeless tobacco. She reports that she does not drink alcohol and does not use drugs. Allergies  Allergen Reactions  . Erythromycin Swelling  . Codeine Palpitations, Rash and Other (See Comments)    GI Upset  . Erythromycin Base Other (See Comments)    GI Upset  . Penicillins Rash and Other (See Comments)    Has taken Keflex & Rocephin many times without problems Documented on MAR  . Zantac [Ranitidine Hcl] Nausea And Vomiting   Prior to Admission medications   Medication Sig Start Date End Date Taking? Authorizing Provider  acetaminophen (TYLENOL) 325 MG tablet Take 650 mg by mouth every 6 (six) hours as needed for fever (greater than 100 F).    Yes [provider]  allopurinol (ZYLOPRIM) 100 MG tablet Take 100 mg by mouth daily.    Yes [provider]  amiodarone (PACERONE) 200 MG tablet Take 2 tablets (473m) daily for 7 days. Thereafter  take 1 tablet (2060m daily. Patient taking differently: Take 200 mg by mouth daily. 08/10/20  Yes Mikhail, MaVelta AddisonDO  ascorbic acid (HM VITAMIN C) 500 MG tablet Take 500 mg by mouth daily.    Yes [provider]  aspirin 81 MG chewable tablet Chew 81 mg by mouth daily.    Yes [provider]  b complex-vitamin c-folic acid (NEPHRO-VITE) 0.8 MG TABS tablet Take 1 tablet by mouth daily.    Yes [provider]  Brinzolamide-Brimonidine 1-0.2 % SUSP Place 1 drop into both eyes 2 (two) times daily.    Yes [provider]  calcium carbonate (TUMS - DOSED IN MG ELEMENTAL CALCIUM)  500 MG chewable tablet Chew 1 tablet by mouth 2 (two) times daily.   Yes [provider]  cetirizine (ZYRTEC) 10 MG tablet Take 10 mg by mouth daily.   Yes [provider]  colestipol (COLESTID) 1 g tablet Take 1 g by mouth daily. 02/05/21  Yes [provider]  diazepam (VALIUM) 5 MG tablet Take 1 tablet (5 mg total) by mouth at bedtime. 11/02/20  Yes [place] Eric J, DO  diclofenac Sodium (VOLTAREN) 1 % GEL Apply 2 g topically See admin instructions. Apply 2 grams to the right knee at bedtime for pain 06/04/20  Yes [provider]  DIMETHICONE, TOPICAL, (SECURA DIMETHICONE PROTECTANT) 5 % CREA Apply 1 application topically See admin instructions. Apply a nickel-sized amount onto the buttocks area 2 times a day for barrier protection   Yes [provider]  diphenoxylate-atropine (LOMOTIL) 2.5-0.025 MG tablet Take 1 tablet by mouth every 6 (six) hours as needed for diarrhea or loose stools. Take 1 tablet by mouth every 6 hours PRN for diarrhea or loose stools   Yes [provider]  diphenoxylate-atropine (LOMOTIL) 2.5-0.025 MG tablet Take 1 tablet by mouth See admin instructions. Monday,Wednesday and Friday prior to hemodialysis   Yes [provider]  fluticasone (FLONASE) 50 MCG/ACT nasal spray Place 2 sprays into both nostrils every 12  (twelve) hours.    Yes [provider]  ipratropium (ATROVENT) 0.03 % nasal spray Place 2 sprays into both nostrils every 12 (twelve) hours. 08/02/20  Yes Hunsucker, Bonna Gains, MD  loperamide (IMODIUM A-D) 2 MG tablet Take 2 tablets (4 mg total) by mouth every 8 (eight) hours as needed for diarrhea or loose stools (for diarrhea). Patient taking differently: Take 4 mg by mouth See admin instructions. Take 2 tablets (4 mg) Daily on MWF; May have 2 tablets (4 mg) prior to Hemodialysis 03/01/19  Yes Pyrtle, Lajuan Lines, MD  Melatonin 5 MG CAPS Take 10 mg by mouth at bedtime.    Yes [provider]  Menthol, Topical Analgesic, (BIOFREEZE) 4 % GEL Apply 1 application topically 3 (three) times daily as needed (bilateral knee pain).   Yes [provider]  midodrine (PROAMATINE) 10 MG tablet Take 10 mg by mouth See admin instructions. Take 1 tablet (10 mg) on MWF for BP Support At Dialysis 09/14/20  Yes [provider]  midodrine (PROAMATINE) 2.5 MG tablet Take 2.5 mg by mouth 3 (three) times daily with meals. Give if BP<120/60   Yes [provider]  OXYGEN Inhale 2 L into the lungs continuous.    Yes [provider]  pantoprazole (PROTONIX) 40 MG tablet Take 1 tablet (40 mg total) by mouth 2 (two) times daily. 11/02/20 12/02/20 Yes [place] Eric J, DO  polyethylene glycol (MIRALAX / GLYCOLAX) 17 g packet Take 17 g by mouth daily as needed for moderate constipation. 11/02/20  Yes [place] Donnamarie Poag, DO  promethazine (PHENERGAN) 12.5 MG tablet Take 12.5 mg by mouth 2 (two) times daily as needed for nausea/vomiting. 02/05/21  Yes [provider]  SALINE NASAL MIST NA Place 2 sprays into the nose 3 (three) times daily.   Yes [provider]  sodium phosphate (FLEET) 7-19 GM/118ML ENEM Place 1 enema rectally daily as needed for severe constipation.   Yes [provider]  traMADol (ULTRAM) 50 MG tablet Take 1-2 tablets (50-100 mg total) by mouth See  admin instructions. Take 1 tablet (50 mg totally) by mouth every 6 hours as needed for mild -  moderate pain; take 2 tablets (162m totally) by mouth as needed for moderate - severe pain 11/02/20  Yes [place] EDonnamarie Poag DO  traZODone (DESYREL) 50 MG tablet Take 1 tablet (50 mg total) by mouth at bedtime. 11/02/20 12/02/20 Yes [place] Eric J, DO  collagenase (SANTYL) ointment Apply 1 application topically daily. Open ulcer to left posterior flank wound bed Patient not taking: Reported on 02/08/2021    [provider]  promethazine (PHENERGAN) 25 MG tablet Take 25 mg by mouth 2 (two) times daily as needed for nausea.  Patient not taking: Reported on 02/08/2021 08/24/20   [provider]   Current Facility-Administered Medications  Medication Dose Route Frequency Provider Last Rate Last Admin  . 0.9 %  sodium chloride infusion  250 mL Intravenous PRN AEmeterio Reeve DO      . acetaminophen (TYLENOL) tablet 650 mg  650 mg Oral Q6H PRN AEmeterio Reeve DO   650 mg at 02/09/21 0900   Or  . acetaminophen (TYLENOL) suppository 650 mg  650 mg Rectal Q6H PRN AEmeterio Reeve DO      . allopurinol (ZYLOPRIM) tablet 100 mg  100 mg Oral Daily AEmeterio Reeve DO      . amiodarone (PACERONE) tablet 200 mg  200 mg Oral Daily AEmeterio Reeve DO   200 mg at 02/09/21 0900  . brimonidine (ALPHAGAN) 0.2 % ophthalmic solution 1 drop  1 drop Both Eyes BID AEmeterio Reeve DO      . brinzolamide (AZOPT) 1 % ophthalmic suspension 1 drop  1 drop Both Eyes BID AEmeterio Reeve DO      . calcium carbonate (dosed in mg elemental calcium) suspension 500 mg of elemental calcium  500 mg of elemental calcium Oral Q6H PRN AEmeterio Reeve DO      . calcium carbonate (TUMS - dosed in mg elemental calcium) chewable tablet 200 mg of elemental calcium  1 tablet Oral BID AEmeterio Reeve DO      . camphor-menthol ([facility] lotion 1 application  1 application Topical [id number] AEmeterio Reeve DO       . Chlorhexidine Gluconate Cloth 2 % PADS 6 each  6 each Topical Q0600 ELynnda Child PA-C      . colestipol (COLESTID) tablet 1 g  1 g Oral Daily AEmeterio Reeve DO      . diclofenac Sodium (VOLTAREN) 1 % topical gel 2 g  2 g Topical QHS AEmeterio Reeve DO      . dimethicone 1 % cream   Topical BID KBonnielee Haff MD      . diphenoxylate-atropine (LOMOTIL) 2.5-0.025 MG per tablet 1 tablet  1 tablet Oral Q6H PRN AEmeterio Reeve DO      . feeding supplement (NEPRO CARB STEADY) liquid 237 mL  237 mL Oral TID BM KBonnielee Haff MD   Held at 02/09/21 [id number] . fluticasone (FLONASE) 50 MCG/ACT nasal spray 2 spray  2 spray Each Nare Q12H AEmeterio Reeve DO      . heparin injection 5,000 Units  5,000 Units Subcutaneous Q8H AEmeterio Reeve DO   5,000 Units at 02/09/21 [id number] . insulin aspart (novoLOG) injection 0-6 Units  0-6 Units Subcutaneous TID WC AEmeterio Reeve DO      . loperamide (IMODIUM) capsule 4 mg  4 mg Oral Q8H PRN AEmeterio Reeve DO      . loratadine (CLARITIN) tablet 10 mg  10 mg Oral Daily AEmeterio Reeve DO      . melatonin tablet 10 mg  10 mg  Oral QHS Emeterio Reeve, DO   10 mg at 02/09/21 0024  . midodrine (PROAMATINE) tablet 2.5 mg  2.5 mg Oral TID WC Emeterio Reeve, DO      . ondansetron [facility]) tablet 4 mg  4 mg Oral Q6H PRN Emeterio Reeve, DO       Or  . ondansetron [facility]) injection 4 mg  4 mg Intravenous Q6H PRN Emeterio Reeve, DO      . pantoprazole (PROTONIX) EC tablet 40 mg  40 mg Oral BID Emeterio Reeve, DO   40 mg at 02/09/21 0900  . promethazine (PHENERGAN) tablet 12.5 mg  12.5 mg Oral BID PRN Emeterio Reeve, DO      . sodium chloride flush (NS) 0.9 % injection 3 mL  3 mL Intravenous Q12H Emeterio Reeve, DO   3 mL at 02/09/21 0901  . sodium chloride flush (NS) 0.9 % injection 3 mL  3 mL Intravenous PRN Emeterio Reeve, DO      . sorbitol 70 % solution 30 mL  30 mL Oral PRN Emeterio Reeve, DO       . traZODone (DESYREL) tablet 50 mg  50 mg Oral QHS Emeterio Reeve, DO   50 mg at 02/09/21 9753   Current Outpatient Medications  Medication Sig Dispense Refill  . acetaminophen (TYLENOL) 325 MG tablet Take 650 mg by mouth every 6 (six) hours as needed for fever (greater than 100 F).     Marland Kitchen allopurinol (ZYLOPRIM) 100 MG tablet Take 100 mg by mouth daily.     Marland Kitchen amiodarone (PACERONE) 200 MG tablet Take 2 tablets (487m) daily for 7 days. Thereafter take 1 tablet (2022m daily. (Patient taking differently: Take 200 mg by mouth daily.)    . ascorbic acid (HM VITAMIN C) 500 MG tablet Take 500 mg by mouth daily.     . Marland Kitchenspirin 81 MG chewable tablet Chew 81 mg by mouth daily.     . Marland Kitchen complex-vitamin c-folic acid (NEPHRO-VITE) 0.8 MG TABS tablet Take 1 tablet by mouth daily.     . Brinzolamide-Brimonidine 1-0.2 % SUSP Place 1 drop into both eyes 2 (two) times daily.     . calcium carbonate (TUMS - DOSED IN MG ELEMENTAL CALCIUM) 500 MG chewable tablet Chew 1 tablet by mouth 2 (two) times daily.    . cetirizine (ZYRTEC) 10 MG tablet Take 10 mg by mouth daily.    . colestipol (COLESTID) 1 g tablet Take 1 g by mouth daily.    . diazepam (VALIUM) 5 MG tablet Take 1 tablet (5 mg total) by mouth at bedtime. 5 tablet 0  . diclofenac Sodium (VOLTAREN) 1 % GEL Apply 2 g topically See admin instructions. Apply 2 grams to the right knee at bedtime for pain    . DIMETHICONE, TOPICAL, (SECURA DIMETHICONE PROTECTANT) 5 % CREA Apply 1 application topically See admin instructions. Apply a nickel-sized amount onto the buttocks area 2 times a day for barrier protection    . diphenoxylate-atropine (LOMOTIL) 2.5-0.025 MG tablet Take 1 tablet by mouth every 6 (six) hours as needed for diarrhea or loose stools. Take 1 tablet by mouth every 6 hours PRN for diarrhea or loose stools    . diphenoxylate-atropine (LOMOTIL) 2.5-0.025 MG tablet Take 1 tablet by mouth See admin instructions. Monday,Wednesday and Friday prior to  hemodialysis    . fluticasone (FLONASE) 50 MCG/ACT nasal spray Place 2 sprays into both nostrils every 12 (twelve) hours.     . Marland Kitchenpratropium (ATROVENT) 0.03 % nasal spray Place  2 sprays into both nostrils every 12 (twelve) hours. 30 mL 12  . loperamide (IMODIUM A-D) 2 MG tablet Take 2 tablets (4 mg total) by mouth every 8 (eight) hours as needed for diarrhea or loose stools (for diarrhea). (Patient taking differently: Take 4 mg by mouth See admin instructions. Take 2 tablets (4 mg) Daily on MWF; May have 2 tablets (4 mg) prior to Hemodialysis) 50 tablet 2  . Melatonin 5 MG CAPS Take 10 mg by mouth at bedtime.     . Menthol, Topical Analgesic, (BIOFREEZE) 4 % GEL Apply 1 application topically 3 (three) times daily as needed (bilateral knee pain).    . midodrine (PROAMATINE) 10 MG tablet Take 10 mg by mouth See admin instructions. Take 1 tablet (10 mg) on MWF for BP Support At Dialysis    . midodrine (PROAMATINE) 2.5 MG tablet Take 2.5 mg by mouth 3 (three) times daily with meals. Give if BP<120/60    . OXYGEN Inhale 2 L into the lungs continuous.     . pantoprazole (PROTONIX) 40 MG tablet Take 1 tablet (40 mg total) by mouth 2 (two) times daily. 60 tablet 0  . polyethylene glycol (MIRALAX / GLYCOLAX) 17 g packet Take 17 g by mouth daily as needed for moderate constipation. 14 each 0  . promethazine (PHENERGAN) 12.5 MG tablet Take 12.5 mg by mouth 2 (two) times daily as needed for nausea/vomiting.    Marland Kitchen SALINE NASAL MIST NA Place 2 sprays into the nose 3 (three) times daily.    . sodium phosphate (FLEET) 7-19 GM/118ML ENEM Place 1 enema rectally daily as needed for severe constipation.    . traMADol (ULTRAM) 50 MG tablet Take 1-2 tablets (50-100 mg total) by mouth See admin instructions. Take 1 tablet (50 mg totally) by mouth every 6 hours as needed for mild - moderate pain; take 2 tablets (133m totally) by mouth as needed for moderate - severe pain 30 tablet 0  . traZODone (DESYREL) 50 MG tablet Take 1  tablet (50 mg total) by mouth at bedtime. 30 tablet 0  . collagenase (SANTYL) ointment Apply 1 application topically daily. Open ulcer to left posterior flank wound bed (Patient not taking: Reported on 02/08/2021)    . promethazine (PHENERGAN) 25 MG tablet Take 25 mg by mouth 2 (two) times daily as needed for nausea.  (Patient not taking: Reported on 02/08/2021)       ROS: As per HPI otherwise negative.  Physical Exam: Vitals:   02/09/21 0100 02/09/21 0409 02/09/21 0559 02/09/21 0800  BP: (!) 150/48 (!) 148/58 (!) 163/55 (!) 159/68  Pulse: 71 66 66 68  Resp: _0 Temp:   97.9 F (36.6 C)   TempSrc:   Oral   SpO2: 100% 100% 100% 100%     General: Frail woman, lying in bed, nad  Head: NCAT sclera not icteric MMM Neck: Supple. No JVD appreciated  Lungs: CTA bilaterally without wheezes, rales, or rhonchi. Breathing is unlabored. Heart: RRR with S1 S2 Abdomen: soft non tender  Lower extremities: 2+ pitting LE edema, bilaterally  Neuro: A & O  X 3. Moves all extremities spontaneously. Psych:  Responds to questions appropriately with a normal affect. Dialysis Access: L chest TDC dsg c,d,i   Labs: Basic Metabolic Panel: Recent Labs  Lab 02/08/21 1754 02/09/21 0456  NA 140 141  K 4.6 4.3  CL 103 101  CO2 25 27  GLUCOSE 80 78  BUN 23 23  CREATININE  3.54* 3.91*  CALCIUM 8.0* 7.8*   Liver Function Tests: Recent Labs  Lab 02/08/21 1754 02/09/21 0456  AST 31 18  ALT 18 15  ALKPHOS 66 54  BILITOT 0.7 0.3  PROT 5.3* 4.6*  ALBUMIN 2.0* 1.6*   No results for input(s): LIPASE, AMYLASE in the last 168 hours. No results for input(s): AMMONIA in the last 168 hours. CBC: Recent Labs  Lab 02/08/21 1754 02/09/21 0456  WBC 8.9 7.7  NEUTROABS 6.8 4.9  HGB 11.8* 10.6*  HCT 38.4 32.6*  MCV 101.6* 100.3*  PLT 123* 121*   Cardiac Enzymes: No results for input(s): CKTOTAL, CKMB, CKMBINDEX, TROPONINI in the last 168 hours. CBG: Recent Labs  Lab 02/08/21 1804  02/09/21 0801 02/09/21 0831 02/09/21 0854  GLUCAP 76 52* 53* 153*   Iron Studies: No results for input(s): IRON, TIBC, TRANSFERRIN, FERRITIN in the last 72 hours. Studies/Results: DG Chest 2 View  Result Date: 02/08/2021 CLINICAL DATA:  Dyspnea, missed dialysis EXAM: CHEST - 2 VIEW COMPARISON:  10/29/2020, CT 10/29/2020 FINDINGS: Left-sided central venous catheter with tips over the right atrium. Moderate bilateral pleural effusions and basilar airspace disease. Cardiomediastinal silhouette within normal limits. Aortic atherosclerosis. No pneumothorax. IMPRESSION: Moderate bilateral pleural effusions with basilar airspace disease. Electronically Signed   By: Donavan Foil M.D.   On: 02/08/2021 18:37   CT Head Wo Contrast  Result Date: 02/08/2021 CLINICAL DATA:  Weakness for 1 week, nausea, vomiting and diarrhea EXAM: CT HEAD WITHOUT CONTRAST TECHNIQUE: Contiguous axial images were obtained from the base of the skull through the vertex without intravenous contrast. COMPARISON:  CT 09/14/2020 FINDINGS: Brain: No evidence of acute infarction, hemorrhage, hydrocephalus, visible mass lesion or mass effect. Diffuse prominence of the ventricles, cisterns and sulci compatible with parenchymal volume loss. A more focal region of extra-axial CSF attenuation across the left frontal lobe is unchanged from prior and may be related to some asymmetric volume loss versus a chronic subdural/hygroma without acute hemorrhagic component. Patchy areas of white matter hypoattenuation are most compatible with chronic microvascular angiopathy. Vascular: Scattered vascular calcifications noted across the carotid siphons and intradural vertebral arteries as well as additional more superficial vascular calcifications seen along the right cerebral convexity as well, unchanged from prior. No worrisome hyperdense vessel or unexpected calcification is seen. Skull: The osseous structures appear diffusely demineralized which may limit  detection of small or nondisplaced fractures. Hyperostosis frontalis interna is a benign, often incidental finding. No acute fracture or worrisome osseous lesion. No scalp swelling or hematoma. Some calcific pannus formation noted posterior to the dens. Sinuses/Orbits: Paranasal sinuses and mastoid air cells are predominantly clear. Orbital structures are unremarkable aside from prior lens extractions. Other: None. IMPRESSION: 1. No acute intracranial abnormality. 2. Stable parenchymal volume loss and chronic microvascular angiopathy. 3. Stable appearance of a more focal region of extra-axial CSF attenuation across the left frontal convexity possibly asymmetric volume loss versus a chronic subdural/hygroma without acute hemorrhagic component. 4. Osseous structures appear diffusely demineralized which may limit detection of small or nondisplaced fractures. Electronically Signed   By: [name] M.D.   On: 02/08/2021 19:42   DG Abd 2 Views  Result Date: 02/08/2021 CLINICAL DATA:  Dyspnea EXAM: ABDOMEN - 2 VIEW COMPARISON:  CT 01/22/2016 FINDINGS: Bilateral pleural effusions and basilar airspace disease. Limited by habitus and positioning. No gross free air. Nonobstructed gas pattern. Extensive vascular calcification. Numerous pelvic calcifications presumably phleboliths. IMPRESSION: Nonobstructed gas pattern. Bilateral pleural effusions and basilar airspace disease. Electronically Signed  By: Donavan Foil M.D.   On: 02/08/2021 18:39    Dialysis Orders:  AF MWF 3h 46mn 400/500 EDW 53kg 3K/2Ca  TDC Heparin 3300 bolus No ESA/VDRA   Assessment/Plan: 1. Generalized weakness: No acute findings on admission. Per primary  2. Diarrhea: Chronic. Followed by GI as outpatient Per primary  3. ESRD -  HD MWF. Truncated treatments this week. Has received about 3 hours of dialysis total for the week. Plan HD today off schedule  4. Hypertension/volume  - BP ok. Midodrine on HD for BP support. Significant edema on  exam. UF as tolerated. Albumin prn.  5. Anemia  - Hgb stable. No ESA needs currently  6. Metabolic bone disease -  Corr Ca ok.  No VDRA/binders as OP. Follow trends  7. Afib - on amiodarone  8. Nutrition - Very low albumin. Add prot supp.  9. Debility- Very frail appearing. May be approaching point where she can no longer tolerate dialysis. Agree with consult to palliative care to determine G[facility]PA-C [facility] 02/09/2021, 10:56 AM

## 2021-02-09 NOTE — ED Notes (Addendum)
Recheck of CBG after OJ came back as 53. Gave pt 42mL of 50% Dextrose will recheck CBG in 3min

## 2021-02-09 NOTE — ED Notes (Signed)
MS Breakfast Ordered 

## 2021-02-09 NOTE — Plan of Care (Signed)
°  Problem: Fluid Volume: °Goal: Compliance with measures to maintain balanced fluid volume will improve °Outcome: Progressing °  °Problem: Nutritional: °Goal: Ability to make healthy dietary choices will improve °Outcome: Progressing °  °

## 2021-02-10 DIAGNOSIS — R531 Weakness: Secondary | ICD-10-CM | POA: Diagnosis not present

## 2021-02-10 DIAGNOSIS — N186 End stage renal disease: Secondary | ICD-10-CM | POA: Diagnosis not present

## 2021-02-10 DIAGNOSIS — E1129 Type 2 diabetes mellitus with other diabetic kidney complication: Secondary | ICD-10-CM | POA: Diagnosis not present

## 2021-02-10 DIAGNOSIS — D638 Anemia in other chronic diseases classified elsewhere: Secondary | ICD-10-CM | POA: Diagnosis not present

## 2021-02-10 LAB — GLUCOSE, CAPILLARY
Glucose-Capillary: 102 mg/dL — ABNORMAL HIGH (ref 70–99)
Glucose-Capillary: 67 mg/dL — ABNORMAL LOW (ref 70–99)
Glucose-Capillary: 70 mg/dL (ref 70–99)
Glucose-Capillary: 79 mg/dL (ref 70–99)
Glucose-Capillary: 87 mg/dL (ref 70–99)
Glucose-Capillary: 93 mg/dL (ref 70–99)
Glucose-Capillary: 94 mg/dL (ref 70–99)

## 2021-02-10 LAB — BASIC METABOLIC PANEL
Anion gap: 13 (ref 5–15)
BUN: 13 mg/dL (ref 8–23)
CO2: 27 mmol/L (ref 22–32)
Calcium: 7.8 mg/dL — ABNORMAL LOW (ref 8.9–10.3)
Chloride: 98 mmol/L (ref 98–111)
Creatinine, Ser: 2.52 mg/dL — ABNORMAL HIGH (ref 0.44–1.00)
GFR, Estimated: 18 mL/min — ABNORMAL LOW (ref >60)
Glucose, Bld: 82 mg/dL (ref 70–99)
Potassium: 3.4 mmol/L — ABNORMAL LOW (ref 3.5–5.1)
Sodium: 138 mmol/L (ref 135–145)

## 2021-02-10 LAB — CBC
HCT: 29.7 % — ABNORMAL LOW (ref 36.0–46.0)
Hemoglobin: 9.6 g/dL — ABNORMAL LOW (ref 12.0–15.0)
MCH: 32.2 pg (ref 26.0–34.0)
MCHC: 32.3 g/dL (ref 30.0–36.0)
MCV: 99.7 fL (ref 80.0–100.0)
Platelets: 118 10*3/uL — ABNORMAL LOW (ref 150–400)
RBC: 2.98 MIL/uL — ABNORMAL LOW (ref 3.87–5.11)
RDW: 16.8 % — ABNORMAL HIGH (ref 11.5–15.5)
WBC: 8.3 10*3/uL (ref 4.0–10.5)
nRBC: 0 % (ref 0.0–0.2)

## 2021-02-10 MED ORDER — MIDODRINE HCL 5 MG PO TABS
10.0000 mg | ORAL_TABLET | ORAL | Status: DC
  Administered 2021-02-11 – 2021-02-13 (×2): 10 mg via ORAL
  Filled 2021-02-10 (×2): qty 2

## 2021-02-10 MED ORDER — MIDODRINE HCL 5 MG PO TABS
5.0000 mg | ORAL_TABLET | Freq: Three times a day (TID) | ORAL | Status: DC
  Administered 2021-02-10 – 2021-02-14 (×9): 5 mg via ORAL
  Filled 2021-02-10 (×12): qty 1

## 2021-02-10 NOTE — Progress Notes (Addendum)
[facility] PROGRESS NOTE   [name] James HQI:[id number] DOB: 27-Oct-1936 DOA: 02/08/2021  PCP: [facility] [place]  Brief History/Interval Summary: 85 y.o. female with medical history significant of ESRD on HD MWF, diastolic CHF, anemia of chronic disease.  Patient has had chronic diarrhea with adjustments made to her treatment by her gastroenterologist, Dr. Elmo Putt.  As result of her persistent diarrhea patient has had to cut short her dialysis treatments.  This has occurred on numerous occasions.  Patient presented mainly with the generalized weakness persistent diarrhea having missed dialysis and worsening edema in the lower extremities.  She was noted to be fluid overloaded.  She was hospitalized for further management.   Consultants: Nephrology.  Palliative care  Procedures: None yet  Antibiotics: Anti-infectives (From admission, onward)   None      Subjective/Interval History: Overnight events noted.  Patient noted to have significant bradycardia with concerns for junctional rhythm.  There was some chest discomfort as well which resolved.  Patient denies any chest pain this morning.  Has some pain in her sacral area which is chronic.      Assessment/Plan:  End-stage renal disease on hemodialysis, Monday Wednesday Friday schedule/fluid overload Patient noted to have significant lower extremity edema.  Imaging study showed bilateral pleural effusion.  She was thought to be significantly volume overloaded.  Nephrology was consulted.  Patient underwent dialysis yesterday but no volume could be removed due to hypotension.  Patient had to curtail outpatient dialysis treatments due to GI symptoms.  Allergies following.  Chronic diarrhea Followed by Dr. Hilarie Fredrickson with gastroenterology.  Looks like she was recently started on colestipol.  Also on Lomotil and Imodium.   Patient has not had any significant diarrhea in the last 48 hours.  Continue to monitor.  Continue current  medications.  She has been passing gas.    Chronic nausea and vomiting Seems to be asymptomatic currently.  Continue to monitor  Anemia of chronic kidney disease/mild thrombocytopenia Mild drop in hemoglobin is noted which is likely dilutional.  No evidence for overt bleeding.  Platelet counts are low but stable.  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  She is just on SSI.  Not on any long-acting insulin or oral glucose lowering agents.  Encourage oral intake.  Nutritional supplements.  Check cortisol level in the morning.  Elevated blood pressure/hypotension with dialysis Patient became hypotensive with dialysis yesterday.  Noted to be on midodrine.  Not on any other antihypertensives.  Is noted to be on amiodarone.   Pleural effusions Moderate bilateral pleural effusion was noted on chest x-ray.  This is likely due to fluid overload from not having been able to tolerate full dialysis treatment in the outpatient setting for the past week to 2 weeks.  Respiratory status is stable.  She is currently requiring only 2 L of oxygen which is what she uses at home.  Could not remove much volume yesterday at dialysis due to hypotension.  This will be a challenging situation.  History of atrial fibrillation/history of sick sinus syndrome Noted to be on amiodarone which is being continued.  Not noted to be on anticoagulation due to history of GI bleed.  Noted to have some degree of bradycardia that appears to be asymptomatic for the most part.  Check TSH in the morning. Per cardiology note from December patient is not a candidate for pacemaker. Echocardiogram showed normal systolic function.  No regional wall motion abnormalities.  EF was 70%.  No other significant abnormalities noted.  History of gout Continue allopurinol.  History of generalized anxiety and insomnia Holding sedative agents.  History  of cognitive impairment Chronic microvascular disease noted on CT brain.  Seems to be somewhat distracted but does not have any focal deficits.  Glaucoma Continue eyedrops.  History of pressure injuries Preventive care. Pressure Injury 08/03/20 Sacrum Stage 2 -  Partial thickness loss of dermis presenting as a shallow open injury with a red, pink wound bed without slough. (Active)  08/03/20 2230  Location: Sacrum  Location Orientation:   Staging: Stage 2 -  Partial thickness loss of dermis presenting as a shallow open injury with a red, pink wound bed without slough.  Wound Description (Comments):   Present on Admission: Yes    Goals of care Prognosis is guarded.  Palliative care was consulted.  However patient's daughter is not willing to engage with them.  Discussed with patient regarding advanced directives.  She would like to discuss with daughter.    DVT Prophylaxis: Subcutaneous heparin Code Status: Full code Family Communication: We will update daughter later today. Disposition Plan: From a skilled nursing facility  Status is: Inpatient  Remains inpatient appropriate because:IV treatments appropriate due to intensity of illness or inability to take PO and Inpatient level of care appropriate due to severity of illness   Dispo: The patient is from: SNF              Anticipated d/c is to: SNF              Anticipated d/c date is: 3 days              Patient currently is not medically stable to d/c.   Difficult to place patient No      Medications:  Scheduled:  (feeding supplement) PROSource Plus  30 mL Oral BID BM   allopurinol  100 mg Oral Daily   amiodarone  200 mg Oral Daily   atropine       brimonidine  1 drop Both Eyes BID   brinzolamide  1 drop Both Eyes BID   calcium carbonate  1 tablet Oral BID   Chlorhexidine Gluconate Cloth  6 each Topical Q0600   colestipol  1 g Oral Daily   diclofenac Sodium  2 g Topical QHS   dimethicone   Topical BID    feeding supplement (NEPRO CARB STEADY)  237 mL Oral TID BM   fluticasone  2 spray Each Nare Q12H   heparin  5,000 Units Subcutaneous Q8H   insulin aspart  0-6 Units Subcutaneous TID WC   loratadine  10 mg Oral Daily   melatonin  10 mg Oral QHS   [START ON 02/11/2021] midodrine  10 mg Oral Q M,W,F-HD   midodrine  2.5 mg Oral TID WC   pantoprazole  40 mg Oral BID   sodium chloride flush  3 mL Intravenous Q12H   traZODone  50 mg Oral QHS   Continuous:  sodium chloride     albumin human 12.5 g (02/10/21 0922)   JQB:HALPFX chloride, acetaminophen **OR** acetaminophen, calcium carbonate (dosed in mg elemental calcium), camphor-menthol **AND** [DISCONTINUED] hydrOXYzine, diphenoxylate-atropine, HYDROcodone-acetaminophen, loperamide, ondansetron **OR** ondansetron (ZOFRAN) IV, promethazine, sodium chloride flush, sorbitol, traMADol   Objective:  Vital Signs  Vitals:   02/09/21 2156 02/09/21 2311 02/10/21 0039 02/10/21 0646  BP: (!) 102/47 (!) 112/50 (!) 101/46 (!) 129/45  Pulse: (!) 48 Marland Kitchen)  50 (!) 53 (!) 59  Resp: 15  15 17   Temp: 98.5 F (36.9 C) 97.9 F (36.6 C) 98.5 F (36.9 C) 97.7 F (36.5 C)  TempSrc:  Oral  Oral  SpO2: 100% 100% 99% 99%  Weight: 56 kg       Intake/Output Summary (Last 24 hours) at 02/10/2021 0928 Last data filed at 02/10/2021 0700 Gross per 24 hour  Intake 640 ml  Output 0 ml  Net 640 ml   Filed Weights   02/09/21 1750 02/09/21 2057 02/09/21 2156  Weight: 56 kg 56 kg 56 kg     General appearance: Awake alert.  In no distress Resp: Normal effort at rest.  Diminished air entry at the bases bilaterally.  No wheezing or rhonchi. Cardio: S1-S2 is normal regular.  No S3-S4.  No rubs murmurs or bruit GI: Abdomen is soft.  Nontender nondistended.  Bowel sounds are present normal.  No masses organomegaly Extremities: Edema noted bilateral lower extremities.  Physical deconditioning is noted. Neurologic: 3.  No focal neurological deficits.      Lab Results:  Data Reviewed: I have personally reviewed following labs and imaging studies  CBC: Recent Labs  Lab 02/08/21 1754 02/09/21 0456 02/10/21 0417  WBC 8.9 7.7 8.3  NEUTROABS 6.8 4.9  --   HGB 11.8* 10.6* 9.6*  HCT 38.4 32.6* 29.7*  MCV 101.6* 100.3* 99.7  PLT 123* 121* 118*    Basic Metabolic Panel: Recent Labs  Lab 02/08/21 1754 02/09/21 0456 02/10/21 0417  NA 140 141 138  K 4.6 4.3 3.4*  CL 103 101 98  CO2 25 27 27   GLUCOSE 80 78 82  BUN 23 23 13   CREATININE 3.54* 3.91* 2.52*  CALCIUM 8.0* 7.8* 7.8*    GFR: Estimated Creatinine Clearance: 14.6 mL/min (A) (by C-G formula based on SCr of 2.52 mg/dL (H)).  Liver Function Tests: Recent Labs  Lab 02/08/21 1754 02/09/21 0456  AST 31 18  ALT 18 15  ALKPHOS 66 54  BILITOT 0.7 0.3  PROT 5.3* 4.6*  ALBUMIN 2.0* 1.6*     HbA1C: Recent Labs    02/08/21 2344  HGBA1C 4.6*    CBG: Recent Labs  Lab 02/09/21 1213 02/10/21 0039 02/10/21 0101 02/10/21 0633 02/10/21 0659  GLUCAP 105* 70 94 67* 93      Recent Results (from the past 240 hour(s))  SARS CORONAVIRUS 2 (TAT 6-24 HRS) Nasopharyngeal Nasopharyngeal Swab     Status: None   Collection Time: 02/08/21 10:07 PM   Specimen: Nasopharyngeal Swab  Result Value Ref Range Status   SARS Coronavirus 2 NEGATIVE NEGATIVE Final    Comment: (NOTE) SARS-CoV-2 target nucleic acids are NOT DETECTED.  The SARS-CoV-2 RNA is generally detectable in upper and lower respiratory specimens during the acute phase of infection. Negative results do not preclude SARS-CoV-2 infection, do not rule out co-infections with other pathogens, and should not be used as the sole basis for treatment or other patient management decisions. Negative results must be combined with clinical observations, patient history, and epidemiological information. The expected result is Negative.  Fact Sheet for Patients: SugarRoll.be  Fact Sheet  for Healthcare Providers: https://www.woods-mathews.com/  This test is not yet approved or cleared by the [place] FDA and  has been authorized for detection and/or diagnosis of SARS-CoV-2 by FDA under an Emergency Use Authorization (EUA). This EUA will remain  in effect (meaning this test can be used) for the duration of the COVID-19 declaration under Se ction 564(b)(1)  of the Act, 21 U.S.C. section 360bbb-3(b)(1), unless the authorization is terminated or revoked sooner.  Performed at [facility], [place] [address], [place], [place] [address]       Radiology Studies: DG Chest 2 View  Result Date: 02/08/2021 CLINICAL DATA:  Dyspnea, missed dialysis EXAM: CHEST - 2 VIEW COMPARISON:  10/29/2020, CT 10/29/2020 FINDINGS: Left-sided central venous catheter with tips over the right atrium. Moderate bilateral pleural effusions and basilar airspace disease. Cardiomediastinal silhouette within normal limits. Aortic atherosclerosis. No pneumothorax. IMPRESSION: Moderate bilateral pleural effusions with basilar airspace disease. Electronically Signed   By: Donavan Foil M.D.   On: 02/08/2021 18:37   CT Head Wo Contrast  Result Date: 02/08/2021 CLINICAL DATA:  Weakness for 1 week, nausea, vomiting and diarrhea EXAM: CT HEAD WITHOUT CONTRAST TECHNIQUE: Contiguous axial images were obtained from the base of the skull through the vertex without intravenous contrast. COMPARISON:  CT 09/14/2020 FINDINGS: Brain: No evidence of acute infarction, hemorrhage, hydrocephalus, visible mass lesion or mass effect. Diffuse prominence of the ventricles, cisterns and sulci compatible with parenchymal volume loss. A more focal region of extra-axial CSF attenuation across the left frontal lobe is unchanged from prior and may be related to some asymmetric volume loss versus a chronic subdural/hygroma without acute hemorrhagic component. Patchy areas of white matter hypoattenuation are most compatible  with chronic microvascular angiopathy. Vascular: Scattered vascular calcifications noted across the carotid siphons and intradural vertebral arteries as well as additional more superficial vascular calcifications seen along the right cerebral convexity as well, unchanged from prior. No worrisome hyperdense vessel or unexpected calcification is seen. Skull: The osseous structures appear diffusely demineralized which may limit detection of small or nondisplaced fractures. Hyperostosis frontalis interna is a benign, often incidental finding. No acute fracture or worrisome osseous lesion. No scalp swelling or hematoma. Some calcific pannus formation noted posterior to the dens. Sinuses/Orbits: Paranasal sinuses and mastoid air cells are predominantly clear. Orbital structures are unremarkable aside from prior lens extractions. Other: None. IMPRESSION: 1. No acute intracranial abnormality. 2. Stable parenchymal volume loss and chronic microvascular angiopathy. 3. Stable appearance of a more focal region of extra-axial CSF attenuation across the left frontal convexity possibly asymmetric volume loss versus a chronic subdural/hygroma without acute hemorrhagic component. 4. Osseous structures appear diffusely demineralized which may limit detection of small or nondisplaced fractures. Electronically Signed   By: [name] M.D.   On: 02/08/2021 19:42   DG Abd 2 Views  Result Date: 02/08/2021 CLINICAL DATA:  Dyspnea EXAM: ABDOMEN - 2 VIEW COMPARISON:  CT 01/22/2016 FINDINGS: Bilateral pleural effusions and basilar airspace disease. Limited by habitus and positioning. No gross free air. Nonobstructed gas pattern. Extensive vascular calcification. Numerous pelvic calcifications presumably phleboliths. IMPRESSION: Nonobstructed gas pattern. Bilateral pleural effusions and basilar airspace disease. Electronically Signed   By: Donavan Foil M.D.   On: 02/08/2021 18:39   ECHOCARDIOGRAM COMPLETE  Result Date: 02/09/2021     ECHOCARDIOGRAM REPORT   Patient Name:   [name] James Date of Exam: 02/09/2021 Medical Rec #:  [id number]       Height:       64.0 in Accession #:    [id number]      Weight:       123.5 lb Date of Birth:  12/07/1936       BSA:          1.594 m Patient Age:    22 years        BP:  91/48 mmHg Patient Gender: F               HR:           68 bpm. Exam Location:  Inpatient Procedure: 2D Echo, Color Doppler and Cardiac Doppler Indications:    I50.9* Heart failure (unspecified)  History:        Patient has prior history of Echocardiogram examinations, most                 recent 07/06/2018. ESRD on HD.  Sonographer:    Merrie Roof RDCS Referring Phys: [id number] [place]  1. Left ventricular ejection fraction, by estimation, is 70 to 75%. The left ventricle has hyperdynamic function. The left ventricle has no regional wall motion abnormalities. There is moderate left ventricular hypertrophy. Left ventricular diastolic parameters are indeterminate.  2. Right ventricular systolic function is normal. The right ventricular size is normal. There is mildly elevated pulmonary artery systolic pressure. The estimated right ventricular systolic pressure is 78.2 mmHg.  3. Left atrial size was moderately dilated.  4. The mitral valve is abnormal. No evidence of mitral valve regurgitation. No evidence of mitral stenosis. Severe mitral annular calcification.  5. Tricuspid valve regurgitation is moderate.  6. The aortic valve was not well visualized. Aortic valve regurgitation is trivial. Mild to moderate aortic valve sclerosis/calcification is present, without any evidence of aortic stenosis. FINDINGS  Left Ventricle: Left ventricular ejection fraction, by estimation, is 70 to 75%. The left ventricle has hyperdynamic function. The left ventricle has no regional wall motion abnormalities. The left ventricular internal cavity size was small. There is moderate left ventricular hypertrophy. Left ventricular  diastolic parameters are indeterminate. Right Ventricle: The right ventricular size is normal. No increase in right ventricular wall thickness. Right ventricular systolic function is normal. There is mildly elevated pulmonary artery systolic pressure. The tricuspid regurgitant velocity is 3.23  m/s, and with an assumed right atrial pressure of 3 mmHg, the estimated right ventricular systolic pressure is 42.3 mmHg. Left Atrium: Left atrial size was moderately dilated. Right Atrium: Right atrial size was normal in size. Pericardium: There is no evidence of pericardial effusion. Mitral Valve: The mitral valve is abnormal. Severe mitral annular calcification. No evidence of mitral valve regurgitation. No evidence of mitral valve stenosis. Tricuspid Valve: The tricuspid valve is normal in structure. Tricuspid valve regurgitation is moderate. Aortic Valve: The aortic valve was not well visualized. Aortic valve regurgitation is trivial. Mild to moderate aortic valve sclerosis/calcification is present, without any evidence of aortic stenosis. Aortic valve mean gradient measures 4.0 mmHg. Aortic  valve peak gradient measures 8.8 mmHg. Aortic valve area, by VTI measures 2.00 cm. Pulmonic Valve: The pulmonic valve was not well visualized. Pulmonic valve regurgitation is not visualized. Aorta: The aortic root and ascending aorta are structurally normal, with no evidence of dilitation. IAS/Shunts: No atrial level shunt detected by color flow Doppler.  LEFT VENTRICLE PLAX 2D LVIDd:         3.20 cm LVIDs:         2.20 cm LV PW:         1.10 cm LV IVS:        1.20 cm LVOT diam:     1.90 cm LV SV:         48 LV SV Index:   30 LVOT Area:     2.84 cm  LV Volumes (MOD) LV vol d, MOD A4C: 36.1 ml LV vol s, MOD A4C: 7.7 ml LV  SV MOD A4C:     36.1 ml RIGHT VENTRICLE RV Basal diam:  3.20 cm LEFT ATRIUM           Index       RIGHT ATRIUM           Index LA diam:      3.60 cm 2.26 cm/m  RA Area:     15.40 cm LA Vol (A2C): 52.2 ml 32.75  ml/m RA Volume:   40.80 ml  25.60 ml/m LA Vol (A4C): 87.2 ml 54.72 ml/m  AORTIC VALVE AV Area (Vmax):    2.03 cm AV Area (Vmean):   1.95 cm AV Area (VTI):     2.00 cm AV Vmax:           148.00 cm/s AV Vmean:          90.600 cm/s AV VTI:            0.238 m AV Peak Grad:      8.8 mmHg AV Mean Grad:      4.0 mmHg LVOT Vmax:         106.00 cm/s LVOT Vmean:        62.300 cm/s LVOT VTI:          0.168 m LVOT/AV VTI ratio: 0.71  AORTA Ao Root diam: 2.80 cm Ao Asc diam:  3.10 cm TRICUSPID VALVE TR Peak grad:   41.7 mmHg TR Vmax:        323.00 cm/s  SHUNTS Systemic VTI:  0.17 m Systemic Diam: 1.90 cm Oswaldo Milian MD Electronically signed by Oswaldo Milian MD Signature Date/Time: 02/09/2021/5:26:21 PM    Final        LOS: 2 days   [facility] Pager on [email or web address]  02/10/2021, 9:28 AM

## 2021-02-10 NOTE — Progress Notes (Signed)
Patient arrived to floor from hemodialysis at approximately 2310.  They were unable to pull any fluid due to patient's blood pressures being low.  At 2156, B/P-102/47 HR-48.  Central Monitoring called to advise patient was now in Junctional Rhythm with HR as low as 44.  At 2311, B/P-112/50, HR-50.  [facility] called and made aware of situation.  Patient is Full Code.  Upon review of previous admissions/office visits, patient has recent history of Symptomatic Junctional Rhythm with severe hypotension.  She is not a candidate for pacemaker per cardiology notes.  Patient is c/o at times of intermittent chest pressure that goes away quickly.  Her main complaint of pain is to her back which is chronic in nature.  She was given 1 hydrocodone twice during the night.  Per [facility], midodrine was given.  Patient already had scheduled Albumin every 6 hours.  Also, [facility] to call them if patient become more symptomatic or if HR continues to drop.  I did call Rapid Response Nurse to make her aware of situation.  She advised me to keep Atropine at the bedside in case it was needed emergently.    Patient's CBG at 0039 was 70.  On grape juice given and CBG at 0103 was 94.  At 0633, CBG was 67.  Patient drank one grape juice and at 0659, CBG was 93.  At end of shift, the patient was eating breakfast.    Will continue to monitor patient.  Earleen Reaper RN

## 2021-02-10 NOTE — Plan of Care (Signed)
  Problem: Nutritional: Goal: Ability to make healthy dietary choices will improve Outcome: Progressing   Problem: Clinical Measurements: Goal: Complications related to the disease process, condition or treatment will be avoided or minimized Outcome: Progressing

## 2021-02-10 NOTE — Progress Notes (Signed)
[facility] Progress Note   Subjective:  Unable to tolerate any UF on dialysis last night d/t hypotension  Palliative care note reviewed -wishes to continue full scope of care.  Feels weak this am, understands she can stop dialysis if it becomes too hard.    Objective Vitals:   02/09/21 2156 02/09/21 2311 02/10/21 0039 02/10/21 0646  BP: (!) 102/47 (!) 112/50 (!) 101/46 (!) 129/45  Pulse: (!) 48 (!) 50 (!) 53 (!) 59  Resp: 15  15 17   Temp: 98.5 F (36.9 C) 97.9 F (36.6 C) 98.5 F (36.9 C) 97.7 F (36.5 C)  TempSrc:  Oral  Oral  SpO2: 100% 100% 99% 99%  Weight: 56 kg        Additional Objective Labs: Basic Metabolic Panel: Recent Labs  Lab 02/08/21 1754 02/09/21 0456 02/10/21 0417  NA 140 141 138  K 4.6 4.3 3.4*  CL 103 101 98  CO2 25 27 27   GLUCOSE 80 78 82  BUN 23 23 13   CREATININE 3.54* 3.91* 2.52*  CALCIUM 8.0* 7.8* 7.8*   CBC: Recent Labs  Lab 02/08/21 1754 02/09/21 0456 02/10/21 0417  WBC 8.9 7.7 8.3  NEUTROABS 6.8 4.9  --   HGB 11.8* 10.6* 9.6*  HCT 38.4 32.6* 29.7*  MCV 101.6* 100.3* 99.7  PLT 123* 121* 118*   Blood Culture    Component Value Date/Time   SDES BLOOD LEFT ARM 10/30/2020 1525   SPECREQUEST  10/30/2020 1525    BOTTLES DRAWN AEROBIC ONLY Blood Culture results may not be optimal due to an inadequate volume of blood received in culture bottles   CULT  10/30/2020 1525    NO GROWTH 5 DAYS Performed at [facility] [address] [place], [place] [address]    REPTSTATUS 11/04/2020 FINAL 10/30/2020 1525     Physical Exam General: Frail woman, lying in bed, nad Heart: RRR Lungs: Clear anteriorly  Abdomen: soft non-tender  Extremities: 2+ pitting LE edema bilaterally  Dialysis Access: L chest TDC dsg c,d,i   Medications: . sodium chloride    . albumin human 25 g (02/10/21 0241)   . (feeding supplement) PROSource Plus  30 mL Oral BID BM  . allopurinol  100 mg Oral Daily  . amiodarone  200 mg Oral  Daily  . atropine      . brimonidine  1 drop Both Eyes BID  . brinzolamide  1 drop Both Eyes BID  . calcium carbonate  1 tablet Oral BID  . Chlorhexidine Gluconate Cloth  6 each Topical Q0600  . colestipol  1 g Oral Daily  . diclofenac Sodium  2 g Topical QHS  . dimethicone   Topical BID  . feeding supplement (NEPRO CARB STEADY)  237 mL Oral TID BM  . fluticasone  2 spray Each Nare Q12H  . heparin  5,000 Units Subcutaneous Q8H  . insulin aspart  0-6 Units Subcutaneous TID WC  . loratadine  10 mg Oral Daily  . melatonin  10 mg Oral QHS  . midodrine  2.5 mg Oral TID WC  . pantoprazole  40 mg Oral BID  . sodium chloride flush  3 mL Intravenous Q12H  . traZODone  50 mg Oral QHS     Dialysis Orders:  AF MWF 3h 71min 400/500 EDW 53kg 3K/2Ca  TDC Heparin 3300 bolus No ESA/VDRA   Assessment/Plan: 1. Generalized weakness: No acute findings on admission. Per primary  2. Diarrhea/rectal pain: Chronic. Limits her ability to tolerate  dialysis treatments.  Followed by GI as outpatient Per primary  3. ESRD -  HD MWF. Truncated treatments last week d/t #2.  HD 2/19 with no UF. Wishes to continue dialysis for now. Next HD 2/21. Added K+ bath  4. Hypertension/volume  - Low BP limits UF. Midodrine on HD for BP support. Significant edema on exam. UF as tolerated. Lower dialysate temp. Albumin prn.  5. Anemia  - Hgb stable. Not on ESA. Follow trends.  6. Metabolic bone disease -  Corr Ca ok.  No VDRA/binders as OP. Follow trends  7. AFib - on amiodarone  8. Nutrition - Very low albumin. Add prot supp.  9. Debility- Very frail appearing. May be approaching point where she can no longer tolerate dialysis. Palliative care consult on 2/19. Continue full scope/full code for now.    Lynnda Child PA-C [facility] 02/10/2021,8:25 AM

## 2021-02-11 DIAGNOSIS — N186 End stage renal disease: Secondary | ICD-10-CM | POA: Diagnosis not present

## 2021-02-11 DIAGNOSIS — D638 Anemia in other chronic diseases classified elsewhere: Secondary | ICD-10-CM | POA: Diagnosis not present

## 2021-02-11 DIAGNOSIS — E1129 Type 2 diabetes mellitus with other diabetic kidney complication: Secondary | ICD-10-CM | POA: Diagnosis not present

## 2021-02-11 DIAGNOSIS — R531 Weakness: Secondary | ICD-10-CM | POA: Diagnosis not present

## 2021-02-11 LAB — CBC
HCT: 26.2 % — ABNORMAL LOW (ref 36.0–46.0)
Hemoglobin: 8.6 g/dL — ABNORMAL LOW (ref 12.0–15.0)
MCH: 32.6 pg (ref 26.0–34.0)
MCHC: 32.8 g/dL (ref 30.0–36.0)
MCV: 99.2 fL (ref 80.0–100.0)
Platelets: 120 10*3/uL — ABNORMAL LOW (ref 150–400)
RBC: 2.64 MIL/uL — ABNORMAL LOW (ref 3.87–5.11)
RDW: 17.1 % — ABNORMAL HIGH (ref 11.5–15.5)
WBC: 8.6 10*3/uL (ref 4.0–10.5)
nRBC: 0 % (ref 0.0–0.2)

## 2021-02-11 LAB — BASIC METABOLIC PANEL
Anion gap: 11 (ref 5–15)
BUN: 20 mg/dL (ref 8–23)
CO2: 27 mmol/L (ref 22–32)
Calcium: 8.3 mg/dL — ABNORMAL LOW (ref 8.9–10.3)
Chloride: 101 mmol/L (ref 98–111)
Creatinine, Ser: 3.02 mg/dL — ABNORMAL HIGH (ref 0.44–1.00)
GFR, Estimated: 15 mL/min — ABNORMAL LOW (ref >60)
Glucose, Bld: 87 mg/dL (ref 70–99)
Potassium: 3.6 mmol/L (ref 3.5–5.1)
Sodium: 139 mmol/L (ref 135–145)

## 2021-02-11 LAB — GLUCOSE, CAPILLARY
Glucose-Capillary: 93 mg/dL (ref 70–99)
Glucose-Capillary: 71 mg/dL (ref 70–99)
Glucose-Capillary: 99 mg/dL (ref 70–99)
Glucose-Capillary: 89 mg/dL (ref 70–99)

## 2021-02-11 LAB — CORTISOL-AM, BLOOD: Cortisol - AM: 14.4 ug/dL (ref 6.7–22.6)

## 2021-02-11 LAB — TSH: TSH: 1.793 u[IU]/mL (ref 0.350–4.500)

## 2021-02-11 MED ORDER — HEPARIN SODIUM (PORCINE) 1000 UNIT/ML IJ SOLN
INTRAMUSCULAR | Status: AC
  Filled 2021-02-11: qty 4

## 2021-02-11 NOTE — Plan of Care (Signed)
  Problem: Nutritional: Goal: Ability to make healthy dietary choices will improve Outcome: Not Progressing   

## 2021-02-11 NOTE — Progress Notes (Addendum)
[facility] Progress Note   Subjective:  Seen in HD unit, pt very weak, able to interact briefly though.   Objective Vitals:   02/11/21 1025 02/11/21 1055 02/11/21 1105 02/11/21 1132  BP: 129/67 113/60 (!) 168/60 (!) 162/57  Pulse:   74 70  Resp: (!) 29 (!) 28 17 16   Temp:   97.8 F (36.6 C) 98.4 F (36.9 C)  TempSrc:   Oral   SpO2:   100% 95%  Weight:   56 kg      Additional Objective Labs: Basic Metabolic Panel: Recent Labs  Lab 02/09/21 0456 02/10/21 0417 02/11/21 0350  NA 141 138 139  K 4.3 3.4* 3.6  CL 101 98 101  CO2 27 27 27   GLUCOSE 78 82 87  BUN 23 13 20   CREATININE 3.91* 2.52* 3.02*  CALCIUM 7.8* 7.8* 8.3*   CBC: Recent Labs  Lab 02/08/21 1754 02/09/21 0456 02/10/21 0417 02/11/21 0350  WBC 8.9 7.7 8.3 8.6  NEUTROABS 6.8 4.9  --   --   HGB 11.8* 10.6* 9.6* 8.6*  HCT 38.4 32.6* 29.7* 26.2*  MCV 101.6* 100.3* 99.7 99.2  PLT 123* 121* 118* 120*   Blood Culture    Component Value Date/Time   SDES BLOOD LEFT ARM 10/30/2020 1525   SPECREQUEST  10/30/2020 1525    BOTTLES DRAWN AEROBIC ONLY Blood Culture results may not be optimal due to an inadequate volume of blood received in culture bottles   CULT  10/30/2020 1525    NO GROWTH 5 DAYS Performed at [facility], [place] [address], [place], [place] [address]    REPTSTATUS 11/04/2020 FINAL 10/30/2020 1525     Physical Exam General: very frail woman, lying in bed, nad Heart: RRR Lungs: Clear anteriorly  Abdomen: soft non-tender  Extremities: 2+ pitting LE edema bilaterally LE > UE's Dialysis Access: L chest TDC dsg c,d,i   Medications: . sodium chloride     . (feeding supplement) PROSource Plus  30 mL Oral BID BM  . allopurinol  100 mg Oral Daily  . amiodarone  200 mg Oral Daily  . brimonidine  1 drop Both Eyes BID  . brinzolamide  1 drop Both Eyes BID  . calcium carbonate  1 tablet Oral BID  . Chlorhexidine Gluconate Cloth  6 each Topical Q0600  . colestipol  1 g  Oral Daily  . diclofenac Sodium  2 g Topical QHS  . dimethicone   Topical BID  . feeding supplement (NEPRO CARB STEADY)  237 mL Oral TID BM  . fluticasone  2 spray Each Nare Q12H  . heparin  5,000 Units Subcutaneous Q8H  . heparin sodium (porcine)      . insulin aspart  0-6 Units Subcutaneous TID WC  . loratadine  10 mg Oral Daily  . melatonin  10 mg Oral QHS  . midodrine  10 mg Oral Q M,W,F-HD  . midodrine  5 mg Oral TID WC  . pantoprazole  40 mg Oral BID  . sodium chloride flush  3 mL Intravenous Q12H  . traZODone  50 mg Oral QHS     OP HD:  AF MWF    3h 92min 400/500   53kg  3K/2Ca  TDC Heparin 3300 - No ESA/VDRA   Assessment/Plan: 1. Generalized weakness: No acute findings on admission. Per primary  2. Diarrhea/rectal pain: Chronic. Limits her ability to tolerate dialysis treatments.  Followed by GI as outpatient. Diarrhea is better here.  3. ESRD -  HD MWF. Truncated treatments last week d/t #2.  HD 2/19 with no UF. HD done this am. For her next inpatient HD on 2/23, will see if she can tolerate HD in a chair.  4. HypOtension/volume  -  Low BP limits UF. Midodrine ^'d to 5 mg tid and 10mg  pre HD tiw. Significant edema on exam. UF none on 2/19, got 2 L UF today w/ HD.  5. Anemia  - Hgb stable. Not on ESA. Follow trends.  6. Metabolic bone disease -  Corr Ca ok.  No VDRA/binders as OP. Follow trends  7. AFib - on amiodarone  8. Nutrition - Very low albumin. Add prot supp.  9. Debility- Very frail appearing. May be approaching point where she can no longer tolerate dialysis. Palliative care consult on 2/19.   Kelly Splinter, MD 02/11/2021, 12:46 PM

## 2021-02-11 NOTE — Progress Notes (Signed)
[facility] PROGRESS NOTE   [name] James VOH:[id number] DOB: Sep 06, 1936 DOA: 02/08/2021  PCP: [facility]  Brief History/Interval Summary: 85 y.o. female with medical history significant of ESRD on HD MWF, diastolic CHF, anemia of chronic disease.  Patient has had chronic diarrhea with adjustments made to her treatment by her gastroenterologist, Dr. Elmo Putt.  As result of her persistent diarrhea patient has had to cut short her dialysis treatments.  This has occurred on numerous occasions.  Patient presented mainly with the generalized weakness persistent diarrhea having missed dialysis and worsening edema in the lower extremities.  She was noted to be fluid overloaded.  She was hospitalized for further management.   Consultants: Nephrology.  Palliative care  Procedures: None yet  Antibiotics: Anti-infectives (From admission, onward)   None      Subjective/Interval History: Patient seen while she was on dialysis.  Mentions that she had a bowel movement yesterday.  Worried that she is going to have another one today.  Continues to have some nausea.  Denies any lightheadedness or dizziness.  No chest pain.  Continues to have some shortness of breath.   Assessment/Plan:  End-stage renal disease on hemodialysis, Monday Wednesday Friday schedule/fluid overload Patient noted to have significant lower extremity edema.  Imaging study showed bilateral pleural effusion.  She was thought to be significantly volume overloaded.  Nephrology was consulted.  Patient could not tolerate dialysis on Friday due to drop in blood pressure.  Dialysis is being reattempted today.  Blood pressure seems to be better after midodrine dose was increased.    Chronic diarrhea Followed by Dr. Hilarie Fredrickson with gastroenterology.  Looks like she was recently started on colestipol.  Also on Lomotil and Imodium.   Patient did have a bowel movement yesterday but no significant frequent diarrhea noted.  Continue  current medications.  Abdomen is benign.  Chronic nausea and vomiting Seems to be asymptomatic currently.  Continue to monitor  Anemia of chronic kidney disease/mild thrombocytopenia Drop in hemoglobin is likely dilutional.  No overt bleeding noted.  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  She is just on SSI.  Not on any long-acting insulin or oral glucose lowering agents.  Encourage oral intake.  Nutritional supplements.  Cortisol level 14.4.  TSH normal at 1.79.  Elevated blood pressure/hypotension with dialysis Patient was noted to be hypotensive with dialysis.  Midodrine dose was increased with improvement.  Continue to monitor.  Also noted to be on amiodarone.  Pleural effusions Moderate bilateral pleural effusion was noted on chest x-ray.  This is likely due to fluid overload from not having been able to tolerate full dialysis treatment in the outpatient setting for the past week to 2 weeks.  Respiratory status is stable.  She is on her baseline oxygen requirements of 2 L/min.  If she continues to poorly tolerate dialysis and it will be a very challenging situation.  History of atrial fibrillation/history of sick sinus syndrome Noted to be on amiodarone which is being continued.  Not noted to be on anticoagulation due to history of GI bleed.  Cardiac but asymptomatic for the most part  Per cardiology note from December patient is not a candidate for pacemaker. Echocardiogram showed normal systolic function.  No regional wall motion abnormalities.  EF was 70%.  No other significant abnormalities noted.  History of gout Continue allopurinol.  History  of generalized anxiety and insomnia Holding sedative agents.  History of cognitive impairment Chronic microvascular disease noted on CT brain.  Seems to be somewhat distracted but does not have any  focal deficits.  Glaucoma Continue eyedrops.  History of pressure injuries Preventive care. Pressure Injury 08/03/20 Sacrum Stage 2 -  Partial thickness loss of dermis presenting as a shallow open injury with a red, pink wound bed without slough. (Active)  08/03/20 2230  Location: Sacrum  Location Orientation:   Staging: Stage 2 -  Partial thickness loss of dermis presenting as a shallow open injury with a red, pink wound bed without slough.  Wound Description (Comments):   Present on Admission: Yes    Goals of care Prognosis is guarded.  Situation will be particularly challenging if the patient is unable to tolerate hemodialysis.  Palliative care was consulted however patient's daughter is not receptive to same based on the notes.  Discussed with Dr. [name] with nephrology who will evaluate patient and see how she does over the next few days.  If she continues to not tolerate her dialysis then may need to rediscuss goals of care.  DVT Prophylaxis: Subcutaneous heparin Code Status: Full code Family Communication: No family at bedside. Disposition Plan: From a skilled nursing facility  Status is: Inpatient  Remains inpatient appropriate because:IV treatments appropriate due to intensity of illness or inability to take PO and Inpatient level of care appropriate due to severity of illness   Dispo: The patient is from: SNF              Anticipated d/c is to: SNF              Anticipated d/c date is: 3 days              Patient currently is not medically stable to d/c.   Difficult to place patient No      Medications:  Scheduled: . (feeding supplement) PROSource Plus  30 mL Oral BID BM  . allopurinol  100 mg Oral Daily  . amiodarone  200 mg Oral Daily  . brimonidine  1 drop Both Eyes BID  . brinzolamide  1 drop Both Eyes BID  . calcium carbonate  1 tablet Oral BID  . Chlorhexidine Gluconate Cloth  6 each Topical Q0600  . colestipol  1 g Oral Daily  . diclofenac Sodium  2  g Topical QHS  . dimethicone   Topical BID  . feeding supplement (NEPRO CARB STEADY)  237 mL Oral TID BM  . fluticasone  2 spray Each Nare Q12H  . heparin  5,000 Units Subcutaneous Q8H  . heparin sodium (porcine)      . insulin aspart  0-6 Units Subcutaneous TID WC  . loratadine  10 mg Oral Daily  . melatonin  10 mg Oral QHS  . midodrine  10 mg Oral Q M,W,F-HD  . midodrine  5 mg Oral TID WC  . pantoprazole  40 mg Oral BID  . sodium chloride flush  3 mL Intravenous Q12H  . traZODone  50 mg Oral QHS   Continuous: . sodium chloride     ZOX:WRUEAV chloride, acetaminophen **OR** acetaminophen, calcium carbonate (dosed in mg elemental calcium), camphor-menthol **AND** [DISCONTINUED] hydrOXYzine, diphenoxylate-atropine, HYDROcodone-acetaminophen, loperamide, ondansetron **OR** ondansetron (ZOFRAN) IV, promethazine, sodium chloride flush, sorbitol, traMADol   Objective:  Vital Signs  Vitals:   02/11/21 0825 02/11/21 0855 02/11/21 0925 02/11/21 0955  BP: (!) 153/59 (!) 130/54 (!) 133/55 125/62  Pulse:  Resp: 19 (!) 24 (!) 24 (!) 22  Temp:      TempSrc:      SpO2:      Weight:        Intake/Output Summary (Last 24 hours) at 02/11/2021 1015 Last data filed at 02/11/2021 0600 Gross per 24 hour  Intake 442.46 ml  Output 0 ml  Net 442.46 ml   Filed Weights   02/09/21 2057 02/09/21 2156 02/11/21 0750  Weight: 56 kg 56 kg 58 kg     General appearance: Awake alert.  In no distress.  Fatigued Resp: Diminished air entry at the bases with few crackles bilaterally.  No wheezing or rhonchi.   Cardio: S1-S2 is bradycardic regular.  No S3-S4.  No rubs murmurs or bruit GI: Abdomen is soft.  Nontender nondistended.  Bowel sounds are present normal.  No masses organomegaly Extremities: Significant edema bilateral lower extremities. Neurologic:   No focal neurological deficits.       Lab Results:  Data Reviewed: I have personally reviewed following labs and imaging  studies  CBC: Recent Labs  Lab 02/08/21 1754 02/09/21 0456 02/10/21 0417 02/11/21 0350  WBC 8.9 7.7 8.3 8.6  NEUTROABS 6.8 4.9  --   --   HGB 11.8* 10.6* 9.6* 8.6*  HCT 38.4 32.6* 29.7* 26.2*  MCV 101.6* 100.3* 99.7 99.2  PLT 123* 121* 118* 120*    Basic Metabolic Panel: Recent Labs  Lab 02/08/21 1754 02/09/21 0456 02/10/21 0417 02/11/21 0350  NA 140 141 138 139  K 4.6 4.3 3.4* 3.6  CL 103 101 98 101  CO2 25 27 27 27   GLUCOSE 80 78 82 87  BUN 23 23 13 20   CREATININE 3.54* 3.91* 2.52* 3.02*  CALCIUM 8.0* 7.8* 7.8* 8.3*    GFR: Estimated Creatinine Clearance: 12.2 mL/min (A) (by C-G formula based on SCr of 3.02 mg/dL (H)).  Liver Function Tests: Recent Labs  Lab 02/08/21 1754 02/09/21 0456  AST 31 18  ALT 18 15  ALKPHOS 66 54  BILITOT 0.7 0.3  PROT 5.3* 4.6*  ALBUMIN 2.0* 1.6*     HbA1C: Recent Labs    02/08/21 2344  HGBA1C 4.6*    CBG: Recent Labs  Lab 02/10/21 0659 02/10/21 1121 02/10/21 1714 02/10/21 2127 02/11/21 0655  GLUCAP 93 102* 79 87 93      Recent Results (from the past 240 hour(s))  SARS CORONAVIRUS 2 (TAT 6-24 HRS) Nasopharyngeal Nasopharyngeal Swab     Status: None   Collection Time: 02/08/21 10:07 PM   Specimen: Nasopharyngeal Swab  Result Value Ref Range Status   SARS Coronavirus 2 NEGATIVE NEGATIVE Final    Comment: (NOTE) SARS-CoV-2 target nucleic acids are NOT DETECTED.  The SARS-CoV-2 RNA is generally detectable in upper and lower respiratory specimens during the acute phase of infection. Negative results do not preclude SARS-CoV-2 infection, do not rule out co-infections with other pathogens, and should not be used as the sole basis for treatment or other patient management decisions. Negative results must be combined with clinical observations, patient history, and epidemiological information. The expected result is Negative.  Fact Sheet for Patients: SugarRoll.be  Fact Sheet  for Healthcare Providers: https://www.woods-mathews.com/  This test is not yet approved or cleared by the [place] FDA and  has been authorized for detection and/or diagnosis of SARS-CoV-2 by FDA under an Emergency Use Authorization (EUA). This EUA will remain  in effect (meaning this test can be used) for the duration of the COVID-19 declaration under  Se ction 564(b)(1) of the Act, 21 U.S.C. section 360bbb-3(b)(1), unless the authorization is terminated or revoked sooner.  Performed at [facility], [place] [address] [place], [place] [address]       Radiology Studies: ECHOCARDIOGRAM COMPLETE  Result Date: 02/09/2021    ECHOCARDIOGRAM REPORT   Patient Name:   LOVELLA HARDIE Maille Date of Exam: 02/09/2021 Medical Rec #:  [id number]       Height:       64.0 in Accession #:    [id number]      Weight:       123.5 lb Date of Birth:  12/05/1936       BSA:          1.594 m Patient Age:    55 years        BP:           91/48 mmHg Patient Gender: F               HR:           68 bpm. Exam Location:  Inpatient Procedure: 2D Echo, Color Doppler and Cardiac Doppler Indications:    I50.9* Heart failure (unspecified)  History:        Patient has prior history of Echocardiogram examinations, most                 recent 07/06/2018. ESRD on HD.  Sonographer:    Merrie Roof RDCS Referring Phys: [id number] [place]  1. Left ventricular ejection fraction, by estimation, is 70 to 75%. The left ventricle has hyperdynamic function. The left ventricle has no regional wall motion abnormalities. There is moderate left ventricular hypertrophy. Left ventricular diastolic parameters are indeterminate.  2. Right ventricular systolic function is normal. The right ventricular size is normal. There is mildly elevated pulmonary artery systolic pressure. The estimated right ventricular systolic pressure is 72.5 mmHg.  3. Left atrial size was moderately dilated.  4. The mitral valve is abnormal. No  evidence of mitral valve regurgitation. No evidence of mitral stenosis. Severe mitral annular calcification.  5. Tricuspid valve regurgitation is moderate.  6. The aortic valve was not well visualized. Aortic valve regurgitation is trivial. Mild to moderate aortic valve sclerosis/calcification is present, without any evidence of aortic stenosis. FINDINGS  Left Ventricle: Left ventricular ejection fraction, by estimation, is 70 to 75%. The left ventricle has hyperdynamic function. The left ventricle has no regional wall motion abnormalities. The left ventricular internal cavity size was small. There is moderate left ventricular hypertrophy. Left ventricular diastolic parameters are indeterminate. Right Ventricle: The right ventricular size is normal. No increase in right ventricular wall thickness. Right ventricular systolic function is normal. There is mildly elevated pulmonary artery systolic pressure. The tricuspid regurgitant velocity is 3.23  m/s, and with an assumed right atrial pressure of 3 mmHg, the estimated right ventricular systolic pressure is 36.6 mmHg. Left Atrium: Left atrial size was moderately dilated. Right Atrium: Right atrial size was normal in size. Pericardium: There is no evidence of pericardial effusion. Mitral Valve: The mitral valve is abnormal. Severe mitral annular calcification. No evidence of mitral valve regurgitation. No evidence of mitral valve stenosis. Tricuspid Valve: The tricuspid valve is normal in structure. Tricuspid valve regurgitation is moderate. Aortic Valve: The aortic valve was not well visualized. Aortic valve regurgitation is trivial. Mild to moderate aortic valve sclerosis/calcification is present, without any evidence of aortic stenosis. Aortic valve mean gradient measures 4.0 mmHg. Aortic  valve peak gradient measures  8.8 mmHg. Aortic valve area, by VTI measures 2.00 cm. Pulmonic Valve: The pulmonic valve was not well visualized. Pulmonic valve regurgitation is not  visualized. Aorta: The aortic root and ascending aorta are structurally normal, with no evidence of dilitation. IAS/Shunts: No atrial level shunt detected by color flow Doppler.  LEFT VENTRICLE PLAX 2D LVIDd:         3.20 cm LVIDs:         2.20 cm LV PW:         1.10 cm LV IVS:        1.20 cm LVOT diam:     1.90 cm LV SV:         48 LV SV Index:   30 LVOT Area:     2.84 cm  LV Volumes (MOD) LV vol d, MOD A4C: 36.1 ml LV vol s, MOD A4C: 7.7 ml LV SV MOD A4C:     36.1 ml RIGHT VENTRICLE RV Basal diam:  3.20 cm LEFT ATRIUM           Index       RIGHT ATRIUM           Index LA diam:      3.60 cm 2.26 cm/m  RA Area:     15.40 cm LA Vol (A2C): 52.2 ml 32.75 ml/m RA Volume:   40.80 ml  25.60 ml/m LA Vol (A4C): 87.2 ml 54.72 ml/m  AORTIC VALVE AV Area (Vmax):    2.03 cm AV Area (Vmean):   1.95 cm AV Area (VTI):     2.00 cm AV Vmax:           148.00 cm/s AV Vmean:          90.600 cm/s AV VTI:            0.238 m AV Peak Grad:      8.8 mmHg AV Mean Grad:      4.0 mmHg LVOT Vmax:         106.00 cm/s LVOT Vmean:        62.300 cm/s LVOT VTI:          0.168 m LVOT/AV VTI ratio: 0.71  AORTA Ao Root diam: 2.80 cm Ao Asc diam:  3.10 cm TRICUSPID VALVE TR Peak grad:   41.7 mmHg TR Vmax:        323.00 cm/s  SHUNTS Systemic VTI:  0.17 m Systemic Diam: 1.90 cm Oswaldo Milian MD Electronically signed by Oswaldo Milian MD Signature Date/Time: 02/09/2021/5:26:21 PM    Final        LOS: 3 days   [facility] Pager on [email or web address]  02/11/2021, 10:15 AM

## 2021-02-11 NOTE — Plan of Care (Signed)
  Problem: Education: Goal: Knowledge of disease and its progression will improve Outcome: Progressing   

## 2021-02-11 NOTE — Telephone Encounter (Signed)
Dr Hilarie Fredrickson- Please see patient's current hospitalization notes.... I attempted to give updated orders to [facility] at [facility] on Friday, but patient was on her way to the hospital after being unable to tolerate dialysis again because of diarrhea. No previous mention of rectal pain as hospital notes indicate. Do I need to make any adjustments to your orders to the nursing home facility based upon your interpretation of current hospital notes?

## 2021-02-11 NOTE — Progress Notes (Signed)
Paged provider regarding midodrine and bp 168/57. per provider hold and recheck BP in an hour. and if SBP is less than 140 give midodrine.

## 2021-02-12 DIAGNOSIS — N186 End stage renal disease: Secondary | ICD-10-CM | POA: Diagnosis not present

## 2021-02-12 DIAGNOSIS — D638 Anemia in other chronic diseases classified elsewhere: Secondary | ICD-10-CM | POA: Diagnosis not present

## 2021-02-12 DIAGNOSIS — E1129 Type 2 diabetes mellitus with other diabetic kidney complication: Secondary | ICD-10-CM | POA: Diagnosis not present

## 2021-02-12 DIAGNOSIS — R531 Weakness: Secondary | ICD-10-CM | POA: Diagnosis not present

## 2021-02-12 LAB — CBC
HCT: 32.1 % — ABNORMAL LOW (ref 36.0–46.0)
Hemoglobin: 10 g/dL — ABNORMAL LOW (ref 12.0–15.0)
MCH: 31.3 pg (ref 26.0–34.0)
MCHC: 31.2 g/dL (ref 30.0–36.0)
MCV: 100.3 fL — ABNORMAL HIGH (ref 80.0–100.0)
Platelets: 124 10*3/uL — ABNORMAL LOW (ref 150–400)
RBC: 3.2 MIL/uL — ABNORMAL LOW (ref 3.87–5.11)
RDW: 17 % — ABNORMAL HIGH (ref 11.5–15.5)
WBC: 8.7 10*3/uL (ref 4.0–10.5)
nRBC: 0 % (ref 0.0–0.2)

## 2021-02-12 LAB — GLUCOSE, CAPILLARY
Glucose-Capillary: 68 mg/dL — ABNORMAL LOW (ref 70–99)
Glucose-Capillary: 85 mg/dL (ref 70–99)
Glucose-Capillary: 95 mg/dL (ref 70–99)
Glucose-Capillary: 101 mg/dL — ABNORMAL HIGH (ref 70–99)
Glucose-Capillary: 104 mg/dL — ABNORMAL HIGH (ref 70–99)

## 2021-02-12 LAB — BASIC METABOLIC PANEL
Anion gap: 11 (ref 5–15)
BUN: 15 mg/dL (ref 8–23)
CO2: 27 mmol/L (ref 22–32)
Calcium: 8.4 mg/dL — ABNORMAL LOW (ref 8.9–10.3)
Chloride: 100 mmol/L (ref 98–111)
Creatinine, Ser: 2.45 mg/dL — ABNORMAL HIGH (ref 0.44–1.00)
GFR, Estimated: 19 mL/min — ABNORMAL LOW (ref >60)
Glucose, Bld: 77 mg/dL (ref 70–99)
Potassium: 4 mmol/L (ref 3.5–5.1)
Sodium: 138 mmol/L (ref 135–145)

## 2021-02-12 MED ORDER — CHLORHEXIDINE GLUCONATE CLOTH 2 % EX PADS: 6.0000 | MEDICATED_PAD | Freq: Every day | CUTANEOUS | Status: DC

## 2021-02-12 NOTE — TOC Initial Note (Signed)
Transition of Care [facility]) - Initial/Assessment Note    Patient Details  Name: [name] James MRN: [id number] Date of Birth: [birth year]  Transition of Care [facility]) CM/SW Contact:    Sable Feil, LCSW Phone Number: 02/12/2021, 5:24 PM  Clinical Narrative:  CSW talked with patient and her daughter [name] James at bedside regarding her discharge disposition. [name] was lying in bed and was alert and pleasant. Patient's daughter confirmed that her mother came from [facility] and has been tere almost 7 years, and the plan is for her to return to [place] at discharge. When asked, [name] responded that her mom has been vaccinated and has had the booster at her dialysis center - [facility].  [name] expressed concerns regarding her brother not being able to visit their mom and was advised that she could ask to speak with the unit director or Surveyor, quantity.               Expected Discharge Plan: Skilled Nursing Facility Barriers to Discharge: Continued Medical Work up   Patient Goals and CMS Choice Patient states their goals for this hospitalization and ongoing recovery are:: Daughter indicated that patient will return to [facility] for continued nursing care CMS Medicare.gov Compare Post Acute Care list provided to:: Other (Comment Required) (Not needed as patient returning to [facility]) Choice offered to / list presented to : NA  Expected Discharge Plan and Services Expected Discharge Plan: [place] In-house Referral: Clinical Social Work     Living arrangements for the past 2 months: [place]                                     Prior Living Arrangements/Services Living arrangements for the past 2 months: [place] Lives with:: Facility Resident ([facility]) Patient language and need for interpreter reviewed:: Yes Do you feel safe going back to the place where you live?: Yes      Need for  Family Participation in Patient Care: Yes (Comment) Care giver support system in place?: Yes (comment)   Criminal Activity/Legal Involvement Pertinent to Current Situation/Hospitalization: No - Comment as needed  Activities of Daily Living      Permission Sought/Granted Permission sought to share information with : Family Supports Permission granted to share information with : Yes, Verbal Permission Granted  Share Information with NAME: [name] James     Permission granted to share info w Relationship: Daughter  Permission granted to share info w Contact Information: [phone]  Emotional Assessment Appearance:: Appears stated age Attitude/Demeanor/Rapport: Other (comment) (Appropriate) Affect (typically observed): Pleasant   Alcohol / Substance Use: Tobacco Use,Alcohol Use,Illicit Drugs (Per H&P, patient reported that she does not smoke, drink alcohol or use illicit drugs) Psych Involvement: No (comment)  Admission diagnosis:  General weakness [R53.1] Generalized weakness [R53.1] Acute congestive heart failure, unspecified heart failure type [facility]) [I50.9] Patient Active Problem List   Diagnosis Date Noted  . Generalized weakness 02/08/2021  . General weakness 02/08/2021  . Atrial fibrillation with RVR ([place]) 08/03/2020  . Nausea and vomiting 03/14/2019  . Chronic diarrhea 03/14/2019  . Dysuria 03/14/2019  . UTI (urinary tract infection) 03/14/2019  . Aortic atherosclerosis ([place]) 12/30/2018  . Hypoalbuminemia due to protein-calorie malnutrition ([place]) 12/30/2018  . Tachycardia-bradycardia syndrome ([place])   . Symptomatic bradycardia 08/24/2018  . Gout 08/23/2018  . Anxiety 08/23/2018  . Pneumothorax, left 10/13/2017  .  Multiple lung nodules on CT 10/06/2017  . Pleural effusion, left 10/06/2017  . Diabetic polyneuropathy associated with type 2 diabetes mellitus ([place]) 07/30/2017  . Onychomycosis of toenail 07/30/2017  . Idiopathic chronic venous hypertension of both lower  extremities with inflammation 07/30/2017  . Chronic respiratory failure with hypoxia ([place]) 04/29/2017  . Advance care planning   . Goals of care, counseling/discussion   . Palliative care by specialist   . Pressure ulcer 01/19/2017  . History of pressure ulcer 10/17/2016  . GAD (generalized anxiety disorder) 10/17/2016  . Osteopenia determined by x-ray 04/17/2016  . Type 2 diabetes mellitus with diabetic neuropathy ([place]) 06/08/2015  . Type 2 diabetes, controlled, with renal manifestation ([place]) 06/08/2015  . PVD (peripheral vascular disease) ([place]) 06/08/2015  . Swelling of limb-Left arm 04/10/2015  . Nausea with vomiting 01/12/2015  . Normocytic anemia 01/12/2015  . Paroxysmal atrial fibrillation ([place]) 12/22/2014  . Melena   . ESRD on dialysis ([place]) 12/19/2014  . Chronic diastolic (congestive) heart failure ([place]) 12/19/2014  . Palpitation 12/19/2014  . Anemia, chronic disease 12/13/2014  . Pulmonary edema 11/10/2014  . Physical deconditioning 10/29/2014  . Gastritis and gastroduodenitis 10/24/2014  . Heme positive stool 10/24/2014  . Morbid obesity ([place]) 10/18/2014  . Edema 10/18/2014  . Essential hypertension 10/17/2014  . DM type 2, uncontrolled, with renal complications ([place]) 58/30/9407  . Dyslipidemia 10/17/2014  . Status post cataract extraction and insertion of intraocular lens 07/28/2012  . Hypertensive CKD (chronic kidney disease) 07/19/2012  . Combined form of senile cataract 06/21/2012  . Fuchs' corneal dystrophy 06/21/2012   PCP:  [facility] [place]:   [place], [place] - [address] [place] [address] Phone: [phone] Fax: [phone]    Social Determinants of Health (SDOH) Interventions  No SDOH interventions requested or needed at this time.  Readmission Risk Interventions No flowsheet data found.

## 2021-02-12 NOTE — Progress Notes (Addendum)
[facility] PROGRESS NOTE   [name] James YTK:[id number] DOB: [birth year] DOA: 02/08/2021  PCP: [facility] [place]  Brief History/Interval Summary: 85 y.o. female with medical history significant of ESRD on HD MWF, diastolic CHF, anemia of chronic disease.  Patient has had chronic diarrhea with adjustments made to her treatment by her gastroenterologist, Dr. Hilarie Fredrickson.  As result of her persistent diarrhea patient has had to cut short her dialysis treatments.  This has occurred on numerous occasions.  Patient presented mainly with the generalized weakness persistent diarrhea having missed dialysis and worsening edema in the lower extremities.  She was noted to be fluid overloaded.  She was hospitalized for further management.  Consultants: Nephrology.  Palliative care  Procedures: None yet  Antibiotics: Anti-infectives (From admission, onward)   None      Subjective/Interval History: Patient complains of nausea this morning.  Apparently has had very poor oral intake.  Glucose level was noted to be low this morning.  Denies any abdominal pain.     Assessment/Plan:  End-stage renal disease on hemodialysis, Monday Wednesday Friday schedule/fluid overload Patient noted to have significant lower extremity edema.  Imaging study showed bilateral pleural effusion.  She was thought to be significantly volume overloaded.  Nephrology was consulted.  Patient could not tolerate dialysis on Friday due to drop in blood pressure.  Patient was dialyzed yesterday.  Next session is planned for tomorrow.    Chronic diarrhea Followed by Dr. Hilarie Fredrickson with gastroenterology.  Looks like she was recently started on colestipol.  Also on Lomotil and Imodium.   Not having significant diarrhea here in the hospital.  Abdomen remains benign.    Chronic nausea and vomiting Feeling nauseated this morning.  Treat symptomatically.  Continue to monitor.  Abdomen is benign   Anemia of chronic kidney disease/mild  thrombocytopenia Drop in hemoglobin is likely dilutional.  No overt bleeding noted.  Hemoglobin noted to be stable this morning.  Platelet counts are low but stable.  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  Patient encouraged to have her nutritional supplements.  Patient just on SSI.  Not on any long-acting insulin or oral agents.  Cortisol level 14.4.  TSH normal at 1.79.  Elevated blood pressure/hypotension with dialysis Patient was noted to be hypotensive with dialysis.  Midodrine dose was increased with improvement.  Continue to monitor.  Also noted to be on amiodarone.  Pleural effusions Moderate bilateral pleural effusion was noted on chest x-ray.  This is likely due to fluid overload from not having been able to tolerate full dialysis treatment in the outpatient setting for the past week to 2 weeks.  Respiratory status is stable.  She is on her baseline oxygen requirements of 2 L/min.  No indication for thoracentesis at this time. If she continues to poorly tolerate dialysis and it will be a very challenging situation.  History of atrial fibrillation/history of sick sinus syndrome Noted to be on amiodarone which is being continued.  Not noted to be on anticoagulation due to history of GI bleed.  Telemetry shows sinus rhythm.  Occasional junctional rhythm noted. Per cardiology note from December patient is not a candidate for pacemaker. Echocardiogram showed normal systolic function.  No regional wall motion abnormalities.  EF was 70%.  No other significant abnormalities noted. Daughter wanted [place] to involve his cardiologist. Dr. Sallyanne Kuster is on vacation. No clear indication for inpatient cardiology consult at this time.  History of gout Continue allopurinol.  History of generalized anxiety and insomnia Holding sedative agents.  History of cognitive impairment Chronic microvascular disease  noted on CT brain.  Seems to be somewhat distracted but does not have any focal deficits.  Glaucoma Continue eyedrops.  History of pressure injuries Preventive care. Pressure Injury 08/03/20 Sacrum Stage 2 -  Partial thickness loss of dermis presenting as a shallow open injury with a red, pink wound bed without slough. (Active)  08/03/20 2230  Location: Sacrum  Location Orientation:   Staging: Stage 2 -  Partial thickness loss of dermis presenting as a shallow open injury with a red, pink wound bed without slough.  Wound Description (Comments):   Present on Admission: Yes    Goals of care Prognosis is guarded.  Situation will be particularly challenging if the patient is unable to tolerate hemodialysis.  Palliative care was consulted however patient's daughter is not receptive to same based on the notes.  Long discussion with daughter yesterday.    DVT Prophylaxis: Subcutaneous heparin Code Status: Full code Family Communication: Daughter updated yesterday. Disposition Plan: From a skilled nursing facility  Status is: Inpatient  Remains inpatient appropriate because:IV treatments appropriate due to intensity of illness or inability to take PO and Inpatient level of care appropriate due to severity of illness   Dispo: The patient is from: SNF              Anticipated d/c is to: SNF              Anticipated d/c date is: 3 days              Patient currently is not medically stable to d/c.   Difficult to place patient No      Medications:  Scheduled: . (feeding supplement) PROSource Plus  30 mL Oral BID BM  . allopurinol  100 mg Oral Daily  . amiodarone  200 mg Oral Daily  . brimonidine  1 drop Both Eyes BID  . brinzolamide  1 drop Both Eyes BID  . calcium carbonate  1 tablet Oral BID  . Chlorhexidine Gluconate Cloth  6 each Topical Q0600  . colestipol  1 g Oral Daily  . diclofenac Sodium  2 g Topical QHS  . dimethicone   Topical BID  . feeding supplement (NEPRO CARB  STEADY)  237 mL Oral TID BM  . fluticasone  2 spray Each Nare Q12H  . heparin  5,000 Units Subcutaneous Q8H  . insulin aspart  0-6 Units Subcutaneous TID WC  . loratadine  10 mg Oral Daily  . melatonin  10 mg Oral QHS  . midodrine  10 mg Oral Q M,W,F-HD  . midodrine  5 mg Oral TID WC  . pantoprazole  40 mg Oral BID  . sodium chloride flush  3 mL Intravenous Q12H  . traZODone  50 mg Oral QHS   Continuous: . sodium chloride     NID:POEUMP chloride, acetaminophen **OR** acetaminophen, calcium carbonate (dosed in mg elemental calcium), camphor-menthol **AND** [DISCONTINUED] hydrOXYzine, diphenoxylate-atropine, HYDROcodone-acetaminophen, loperamide, ondansetron **OR** ondansetron (ZOFRAN) IV, promethazine, sodium chloride flush, sorbitol, traMADol   Objective:  Vital Signs  Vitals:   02/11/21 1700 02/11/21 1901 02/12/21 0404 02/12/21 0819  BP: (!) 134/120 (!) 122/50 137/62 (!) 151/52  Pulse: 63 (!) 58 72 76  Resp: 18 16 18 17   Temp: (!) 97.4 F (36.3 C) 98.4 F (36.9 C) 97.9 F (36.6 C) 97.7 F (36.5 C)  TempSrc: Oral  Oral  Oral  SpO2: 100% 100% 100% 100%  Weight:        Intake/Output Summary (Last 24 hours) at 02/12/2021 1200 Last data filed at 02/11/2021 1531 Gross per 24 hour  Intake 483 ml  Output -  Net 483 ml   Filed Weights   02/09/21 2156 02/11/21 0750 02/11/21 1105  Weight: 56 kg 58 kg 56 kg    General appearance:  Noted to be lethargic. Resp: Diminished air entry at the bases.  Normal effort at rest. Cardio: S1-S2 is normal regular.  No S3-S4.  No rubs murmurs or bruit GI: Abdomen is soft.  Nontender nondistended.  Bowel sounds are present normal.  No masses organomegaly Extremities: Edema bilateral lower extremity Neurologic:  No focal neurological deficits.      Lab Results:  Data Reviewed: I have personally reviewed following labs and imaging studies  CBC: Recent Labs  Lab 02/08/21 1754 02/09/21 0456 02/10/21 0417 02/11/21 0350 02/12/21 0505   WBC 8.9 7.7 8.3 8.6 8.7  NEUTROABS 6.8 4.9  --   --   --   HGB 11.8* 10.6* 9.6* 8.6* 10.0*  HCT 38.4 32.6* 29.7* 26.2* 32.1*  MCV 101.6* 100.3* 99.7 99.2 100.3*  PLT 123* 121* 118* 120* 124*    Basic Metabolic Panel: Recent Labs  Lab 02/08/21 1754 02/09/21 0456 02/10/21 0417 02/11/21 0350 02/12/21 0505  NA 140 141 138 139 138  K 4.6 4.3 3.4* 3.6 4.0  CL 103 101 98 101 100  CO2 25 27 27 27 27   GLUCOSE 80 78 82 87 77  BUN 23 23 13 20 15   CREATININE 3.54* 3.91* 2.52* 3.02* 2.45*  CALCIUM 8.0* 7.8* 7.8* 8.3* 8.4*    GFR: Estimated Creatinine Clearance: 15 mL/min (A) (by C-G formula based on SCr of 2.45 mg/dL (H)).  Liver Function Tests: Recent Labs  Lab 02/08/21 1754 02/09/21 0456  AST 31 18  ALT 18 15  ALKPHOS 66 54  BILITOT 0.7 0.3  PROT 5.3* 4.6*  ALBUMIN 2.0* 1.6*     HbA1C: No results for input(s): HGBA1C in the last 72 hours.  CBG: Recent Labs  Lab 02/11/21 1157 02/11/21 1648 02/11/21 2215 02/12/21 0732 02/12/21 0810  GLUCAP 71 99 89 68* 85      Recent Results (from the past 240 hour(s))  SARS CORONAVIRUS 2 (TAT 6-24 HRS) Nasopharyngeal Nasopharyngeal Swab     Status: None   Collection Time: 02/08/21 10:07 PM   Specimen: Nasopharyngeal Swab  Result Value Ref Range Status   SARS Coronavirus 2 NEGATIVE NEGATIVE Final    Comment: (NOTE) SARS-CoV-2 target nucleic acids are NOT DETECTED.  The SARS-CoV-2 RNA is generally detectable in upper and lower respiratory specimens during the acute phase of infection. Negative results do not preclude SARS-CoV-2 infection, do not rule out co-infections with other pathogens, and should not be used as the sole basis for treatment or other patient management decisions. Negative results must be combined with clinical observations, patient history, and epidemiological information. The expected result is Negative.  Fact Sheet for Patients: SugarRoll.be  Fact Sheet for  Healthcare Providers: https://www.woods-mathews.com/  This test is not yet approved or cleared by the [place] FDA and  has been authorized for detection and/or diagnosis of SARS-CoV-2 by FDA under an Emergency Use Authorization (EUA). This EUA will remain  in effect (meaning this test can be used) for the duration of the COVID-19 declaration under Se ction 564(b)(1) of the Act, 21 U.S.C. section 360bbb-3(b)(1), unless the authorization is terminated  or revoked sooner.  Performed at [facility], [place] [address], [place], [place] [address]       Radiology Studies: No results found.     LOS: 4 days   [employer] on [email or web address]  02/12/2021, 12:00 PM

## 2021-02-12 NOTE — Progress Notes (Signed)
[facility] Progress Note   Subjective:   Patient seen and examined at bedside.  Reports n/v yesterday and overall still weak and tired.  Denies CP, SOB and abdominal pain.   Objective Vitals:   02/11/21 1700 02/11/21 1901 02/12/21 0404 02/12/21 0819  BP: (!) 134/120 (!) 122/50 137/62 (!) 151/52  Pulse: 63 (!) 58 72 76  Resp: 18 16 18 17   Temp: (!) 97.4 F (36.3 C) 98.4 F (36.9 C) 97.9 F (36.6 C) 97.7 F (36.5 C)  TempSrc: Oral  Oral Oral  SpO2: 100% 100% 100% 100%  Weight:       Physical Exam General: frail, elderly female in NAD Heart:RRR Lungs:CTAB anterolaterally  Abdomen:soft, NTND Extremities:1+ LE edema b/l Dialysis Access: L [facility] c/d/i   Filed Weights   02/09/21 2156 02/11/21 0750 02/11/21 1105  Weight: 56 kg 58 kg 56 kg    Intake/Output Summary (Last 24 hours) at 02/12/2021 1247 Last data filed at 02/11/2021 1531 Gross per 24 hour  Intake 483 ml  Output -  Net 483 ml    Additional Objective Labs: Basic Metabolic Panel: Recent Labs  Lab 02/10/21 0417 02/11/21 0350 02/12/21 0505  NA 138 139 138  K 3.4* 3.6 4.0  CL 98 101 100  CO2 27 27 27   GLUCOSE 82 87 77  BUN 13 20 15   CREATININE 2.52* 3.02* 2.45*  CALCIUM 7.8* 8.3* 8.4*   Liver Function Tests: Recent Labs  Lab 02/08/21 1754 02/09/21 0456  AST 31 18  ALT 18 15  ALKPHOS 66 54  BILITOT 0.7 0.3  PROT 5.3* 4.6*  ALBUMIN 2.0* 1.6*   CBC: Recent Labs  Lab 02/08/21 1754 02/09/21 0456 02/10/21 0417 02/11/21 0350 02/12/21 0505  WBC 8.9 7.7 8.3 8.6 8.7  NEUTROABS 6.8 4.9  --   --   --   HGB 11.8* 10.6* 9.6* 8.6* 10.0*  HCT 38.4 32.6* 29.7* 26.2* 32.1*  MCV 101.6* 100.3* 99.7 99.2 100.3*  PLT 123* 121* 118* 120* 124*   CBG: Recent Labs  Lab 02/11/21 1648 02/11/21 2215 02/12/21 0732 02/12/21 0810 02/12/21 1215  GLUCAP 99 89 68* 85 95    Medications: . sodium chloride     . (feeding supplement) PROSource Plus  30 mL Oral BID BM  . allopurinol  100 mg Oral  Daily  . amiodarone  200 mg Oral Daily  . brimonidine  1 drop Both Eyes BID  . brinzolamide  1 drop Both Eyes BID  . calcium carbonate  1 tablet Oral BID  . Chlorhexidine Gluconate Cloth  6 each Topical Q0600  . colestipol  1 g Oral Daily  . diclofenac Sodium  2 g Topical QHS  . dimethicone   Topical BID  . feeding supplement (NEPRO CARB STEADY)  237 mL Oral TID BM  . fluticasone  2 spray Each Nare Q12H  . heparin  5,000 Units Subcutaneous Q8H  . insulin aspart  0-6 Units Subcutaneous TID WC  . loratadine  10 mg Oral Daily  . melatonin  10 mg Oral QHS  . midodrine  10 mg Oral Q M,W,F-HD  . midodrine  5 mg Oral TID WC  . pantoprazole  40 mg Oral BID  . sodium chloride flush  3 mL Intravenous Q12H  . traZODone  50 mg Oral QHS    Dialysis Orders: AF MWF    3h 60min 400/500   53kg  3K/2Ca  TDC Heparin 3300 - No ESA/VDRA  Assessment/Plan: 1. Generalized weakness: No acute  findings on admission. Per primary  2. Diarrhea/rectal pain: Chronic. Limits her ability to tolerate dialysis treatments.  Followed by GI as outpatient. Diarrhea is better here.  3. ESRD -HD MWF. Truncated treatments last week d/t #2.  HD 2/19 with no UF. HD completed yesterday with net UF 2L. Plan for HD tomorrow in chair to see if tolerated.   4. HypOtension/volume -  Low BP limits UF. Midodrine ^'d to 5 mg tid and 10mg  pre HD tiw. Continues to have edema on exam, No UF 2/19, 2L UF with HD yesterday.  CXR on admit with b/l pleural effusions. Plan for additional 2L tomorrow.  5. Anemia - Hgb stable. Not on ESA. Follow trends.  6. Metabolic bone disease -Corr Ca ok. No VDRA/binders as OP. Follow trends  7. AFib - on amiodarone 8. Nutrition -Very low albumin. Add prot supp.  9. Debility- Very frail appearing. May be approaching point where she can no longer tolerate dialysis. Palliative care consult on 2/19. 10. DMT2 - per primary.    Jen Mow, PA-C [facility] 02/12/2021,12:47  PM  LOS: 4 days

## 2021-02-12 NOTE — Progress Notes (Signed)
Hypoglycemic Event  CBG: 68 0740  Treatment: Gave juice   Symptoms: no symptoms  Follow-up CBG: time 810     CBG  85    Possible Reasons for Event:  Patient not eating. Comments/MD notified: yes

## 2021-02-12 NOTE — Care Management Important Message (Signed)
Important Message  Patient Details  Name: [name] James MRN: [id number] Date of Birth: 1936/04/25   Medicare Important Message Given:  Yes     Orbie Pyo 02/12/2021, 2:26 PM

## 2021-02-13 ENCOUNTER — Ambulatory Visit: Payer: Medicare Other | Admitting: Vascular Surgery

## 2021-02-13 ENCOUNTER — Ambulatory Visit (HOSPITAL_COMMUNITY): Payer: Medicare Other

## 2021-02-13 DIAGNOSIS — R531 Weakness: Secondary | ICD-10-CM | POA: Diagnosis not present

## 2021-02-13 LAB — GLUCOSE, CAPILLARY
Glucose-Capillary: 87 mg/dL (ref 70–99)
Glucose-Capillary: 120 mg/dL — ABNORMAL HIGH (ref 70–99)
Glucose-Capillary: 75 mg/dL (ref 70–99)
Glucose-Capillary: 102 mg/dL — ABNORMAL HIGH (ref 70–99)

## 2021-02-13 LAB — CBC
HCT: 32.3 % — ABNORMAL LOW (ref 36.0–46.0)
Hemoglobin: 10.5 g/dL — ABNORMAL LOW (ref 12.0–15.0)
MCH: 31.7 pg (ref 26.0–34.0)
MCHC: 32.5 g/dL (ref 30.0–36.0)
MCV: 97.6 fL (ref 80.0–100.0)
Platelets: 143 10*3/uL — ABNORMAL LOW (ref 150–400)
RBC: 3.31 MIL/uL — ABNORMAL LOW (ref 3.87–5.11)
RDW: 17 % — ABNORMAL HIGH (ref 11.5–15.5)
WBC: 11.3 10*3/uL — ABNORMAL HIGH (ref 4.0–10.5)
nRBC: 0 % (ref 0.0–0.2)

## 2021-02-13 LAB — BASIC METABOLIC PANEL
Anion gap: 11 (ref 5–15)
BUN: 22 mg/dL (ref 8–23)
CO2: 29 mmol/L (ref 22–32)
Calcium: 8.8 mg/dL — ABNORMAL LOW (ref 8.9–10.3)
Chloride: 100 mmol/L (ref 98–111)
Creatinine, Ser: 2.94 mg/dL — ABNORMAL HIGH (ref 0.44–1.00)
GFR, Estimated: 15 mL/min — ABNORMAL LOW (ref >60)
Glucose, Bld: 88 mg/dL (ref 70–99)
Potassium: 4.1 mmol/L (ref 3.5–5.1)
Sodium: 140 mmol/L (ref 135–145)

## 2021-02-13 MED ORDER — LIDOCAINE-PRILOCAINE 2.5-2.5 % EX CREA: 1.0000 "application " | TOPICAL_CREAM | CUTANEOUS | Status: DC | PRN

## 2021-02-13 MED ORDER — ALTEPLASE 2 MG IJ SOLR: 2.0000 mg | Freq: Once | INTRAMUSCULAR | Status: DC | PRN

## 2021-02-13 MED ORDER — HEPARIN SODIUM (PORCINE) 1000 UNIT/ML DIALYSIS
20.0000 [IU]/kg | INTRAMUSCULAR | Status: DC | PRN
Start: 2021-02-13 — End: 2021-02-13

## 2021-02-13 MED ORDER — LIDOCAINE HCL (PF) 1 % IJ SOLN: 5.0000 mL | INTRAMUSCULAR | Status: DC | PRN

## 2021-02-13 MED ORDER — SODIUM CHLORIDE 0.9 % IV SOLN: 100.0000 mL | INTRAVENOUS | Status: DC | PRN

## 2021-02-13 MED ORDER — HEPARIN SODIUM (PORCINE) 1000 UNIT/ML DIALYSIS: 1000.0000 [IU] | INTRAMUSCULAR | Status: DC | PRN

## 2021-02-13 MED ORDER — PENTAFLUOROPROP-TETRAFLUOROETH EX AERO: 1.0000 "application " | INHALATION_SPRAY | CUTANEOUS | Status: DC | PRN

## 2021-02-13 MED ORDER — HEPARIN SODIUM (PORCINE) 1000 UNIT/ML DIALYSIS
1000.0000 [IU] | INTRAMUSCULAR | Status: DC | PRN
Start: 2021-02-14 — End: 2021-02-15

## 2021-02-13 MED ORDER — LIDOCAINE HCL (PF) 1 % IJ SOLN
5.0000 mL | INTRAMUSCULAR | Status: DC | PRN
Start: 2021-02-14 — End: 2021-02-15

## 2021-02-13 NOTE — Progress Notes (Signed)
PROGRESS NOTE  [name] James  DOB: 06/20/1936  PCP: Patrick Jupiter LNL:[id number]  DOA: 02/08/2021  LOS: 5 days   Chief Complaint  Patient presents with  . Weakness    Brief narrative: [name] James is a 85 y.o. female with PMH significant for ESRD on HD MWF, diastolic CHF, anemia of chronic disease, chronic diarrhea who is a resident of [facility] and rehab. Patient has had chronic diarrhea with adjustments made to her treatment by her gastroenterologist, Dr. Hilarie Fredrickson.  As result of her persistent diarrhea patient has had to cut short her dialysis treatments.  This has occurred on numerous occasions.   On 02/08/2021, patient presented mainly with the generalized weakness due to persistent diarrhea, having missed dialysis and worsening edema in the lower extremities.   She was noted to be fluid overloaded.  She was hospitalized for further management.  Subjective: Patient was seen and examined this morning. Frail elderly Caucasian female.  Lying down in bed.  Not in distress.  Assessment/Plan: Volume overload ESRD-HD-MWF -Presented with a state of volume overload based on significant lower extremity edema, bilateral pleural effusion.  -Seen by nephrology.  Reinitiated in dialysis.  Drop in blood pressure has been a challenge during dialysis.   Hypotension with dialysis -Currently on midodrine with improvement.  Chronic diarrhea Followed by Dr. Hilarie Fredrickson with gastroenterology.  Looks like she was recently started on colestipol.  Also on Lomotil and Imodium.   Not having significant diarrhea here in the hospital.  Abdomen remains benign.    Chronic nausea and vomiting -Antiemetics as needed.  Anemia of chronic kidney disease -Hemoglobin stable at this time.  No active bleeding. Recent Labs    08/05/20 1428 08/06/20 0802 02/09/21 0456 02/10/21 0417 02/11/21 0350 02/12/21 0505 02/13/21 0413  HGB  --    < > 10.6* 9.6* 8.6* 10.0* 10.5*  MCV  --    < > 100.3* 99.7 99.2  100.3* 97.6  VITAMINB12 964*  --   --   --   --   --   --   FOLATE 67.5  --   --   --   --   --   --    < > = values in this interval not displayed.   Diabetes mellitus type 2 Hypoglycemia -A1c 4.6 on 02/08/2021 -Currently on sliding scale insulin with Accu-Cheks. -Because of poor oral intake, blood sugar is running on lower side of normal.  Continue to encourage oral intake.  Continue nutritional supplements. Recent Labs  Lab 02/12/21 1215 02/12/21 1644 02/12/21 2050 02/13/21 0753 02/13/21 1226  GLUCAP 95 101* 104* 87 120*   Pleural effusions -Chest x-ray on admission showed moderate bilateral pleural effusion, related to volume overload.  Did not have thoracentesis done. -Currently on 2 L oxygen by nasal cannula.   H/o atrial fibrillation H/o sick sinus syndrome -Admitted and continued.   -Not on anticoagulation because of history of GI bleeding. -Not a candidate for pacemaker per cardiology note. -Echocardiogram from 02/09/2021 showed EF of 70 to 75%, moderate LVH.  History of gout Continue allopurinol.  History of generalized anxiety and insomnia Holding sedative agents.  History of cognitive impairment Chronic microvascular disease noted on CT brain.  Seems to be somewhat distracted but does not have any focal deficits.  Glaucoma Continue eyedrops.  History of pressure injuries Preventive care. Pressure Injury 08/03/20 Sacrum Stage 2 -  Partial thickness loss of dermis presenting as a shallow open injury with a red, pink wound bed without  slough. (Active)  08/03/20 2230  Location: Sacrum  Location Orientation:   Staging: Stage 2 -  Partial thickness loss of dermis presenting as a shallow open injury with a red, pink wound bed without slough.  Wound Description (Comments):   Present on Admission: Yes   Goals of care Prognosis is guarded.  Situation will be particularly challenging if the patient is unable to tolerate hemodialysis.  Palliative care was  consulted however patient's daughter is not receptive to same based on the notes.  Remains full code at this time  Mobility: Limited mobility.  Nursing home resident Code Status:   Code Status: Full Code  Nutritional status: Body mass index is 21.61 kg/m.     Diet Order            Diet renal with fluid restriction Fluid restriction: 1200 mL Fluid; Room service appropriate? Yes; Fluid consistency: Thin  Diet effective now                 DVT prophylaxis: heparin injection 5,000 Units Start: 02/08/21 2230   Antimicrobials:  None Fluid: Currently not on IV fluid Consultants: Nephrology Family Communication:  None at bedside  Status is: Inpatient  Dispo:  Patient From: [place]  Planned Disposition: [place]  Expected discharge date: 02/15/2021  Medically stable for discharge: No         Infusions:  . sodium chloride    . sodium chloride    . sodium chloride      Scheduled Meds: . (feeding supplement) PROSource Plus  30 mL Oral BID BM  . allopurinol  100 mg Oral Daily  . amiodarone  200 mg Oral Daily  . brimonidine  1 drop Both Eyes BID  . brinzolamide  1 drop Both Eyes BID  . calcium carbonate  1 tablet Oral BID  . Chlorhexidine Gluconate Cloth  6 each Topical Q0600  . colestipol  1 g Oral Daily  . diclofenac Sodium  2 g Topical QHS  . dimethicone   Topical BID  . feeding supplement (NEPRO CARB STEADY)  237 mL Oral TID BM  . fluticasone  2 spray Each Nare Q12H  . heparin  5,000 Units Subcutaneous Q8H  . insulin aspart  0-6 Units Subcutaneous TID WC  . loratadine  10 mg Oral Daily  . melatonin  10 mg Oral QHS  . midodrine  10 mg Oral Q M,W,F-HD  . midodrine  5 mg Oral TID WC  . pantoprazole  40 mg Oral BID  . sodium chloride flush  3 mL Intravenous Q12H  . traZODone  50 mg Oral QHS    Antimicrobials: Anti-infectives (From admission, onward)   None      PRN meds: sodium chloride, sodium chloride, sodium chloride,  acetaminophen **OR** acetaminophen, alteplase, calcium carbonate (dosed in mg elemental calcium), camphor-menthol **AND** [DISCONTINUED] hydrOXYzine, diphenoxylate-atropine, heparin, heparin, HYDROcodone-acetaminophen, lidocaine (PF), lidocaine-prilocaine, loperamide, ondansetron **OR** ondansetron (ZOFRAN) IV, pentafluoroprop-tetrafluoroeth, promethazine, sodium chloride flush, sorbitol, traMADol   Objective: Vitals:   02/13/21 1530 02/13/21 1600  BP: (!) 106/39 (!) 101/43  Pulse: 75 76  Resp:    Temp:    SpO2:      Intake/Output Summary (Last 24 hours) at 02/13/2021 1702 Last data filed at 02/13/2021 0900 Gross per 24 hour  Intake 120 ml  Output --  Net 120 ml   Filed Weights   02/11/21 0750 02/11/21 1105 02/13/21 1320  Weight: 58 kg 56 kg 57.1 kg   Weight change:  Body  mass index is 21.61 kg/m.   Physical Exam: General exam: Elderly Caucasian female.  Nonspecific complaints Skin: No rashes, lesions or ulcers. HEENT: Atraumatic, normocephalic, no obvious bleeding Lungs: Clear to auscultation bilaterally CVS: Controlled rate and rhythm, no murmur GI/Abd soft, nontender, nondistended, bowel sounds CNS: Alert, awake, oriented to place and person Psychiatry: Depressed look Extremities: Trace to 1+ bilateral pedal edema  Data Review: I have personally reviewed the laboratory data and studies available.  Recent Labs  Lab 02/08/21 1754 02/09/21 0456 02/10/21 0417 02/11/21 0350 02/12/21 0505 02/13/21 0413  WBC 8.9 7.7 8.3 8.6 8.7 11.3*  NEUTROABS 6.8 4.9  --   --   --   --   HGB 11.8* 10.6* 9.6* 8.6* 10.0* 10.5*  HCT 38.4 32.6* 29.7* 26.2* 32.1* 32.3*  MCV 101.6* 100.3* 99.7 99.2 100.3* 97.6  PLT 123* 121* 118* 120* 124* 143*   Recent Labs  Lab 02/09/21 0456 02/10/21 0417 02/11/21 0350 02/12/21 0505 02/13/21 0413  NA 141 138 139 138 140  K 4.3 3.4* 3.6 4.0 4.1  CL 101 98 101 100 100  CO2 27 27 27 27 29   GLUCOSE 78 82 87 77 88  BUN 23 13 20 15 22   CREATININE  3.91* 2.52* 3.02* 2.45* 2.94*  CALCIUM 7.8* 7.8* 8.3* 8.4* 8.8*    F/u labs ordered Unresulted Labs (From admission, onward)          Start     Ordered   02/13/21 1444  Renal function panel  Once,   R        02/13/21 1443   02/13/21 1444  CBC  Once,   R        02/13/21 1443   02/11/21 0500  CBC  Daily,   R      02/10/21 0938   02/11/21 7741  Basic metabolic panel  Daily,   R      02/10/21 0938   02/08/21 2228  Urine culture  Add-on,   AD        02/08/21 2229          Signed, Terrilee Croak, MD [facility] 02/13/2021

## 2021-02-13 NOTE — Telephone Encounter (Signed)
I have contacted Fatama @ [facility] to give orders that patient is to be on colestipol 2 grams daily and that she should be getting lomotil and imodium on hemodialysis days as previously planned. Unfortunately, since patient has been admitted to the hospital, Rose Phi indicates that patient gets taken "out of the system at [facility]" so she cannot add these orders to the patient chart. She indicates that new orders would have to be placed when and if she comes back to [facility]. I will attempt to send this information to the hospital team so they are aware.

## 2021-02-13 NOTE — Telephone Encounter (Signed)
According to hospital notes diarrhea has not been a problem while hospitalized recently  Would continue our current recently recommended regimen after discharge

## 2021-02-13 NOTE — Plan of Care (Signed)
  Problem: Education: Goal: Individualized Educational Video(s) Outcome: Progressing   Problem: Health Behavior/Discharge Planning: Goal: Ability to manage health-related needs will improve Outcome: Progressing

## 2021-02-13 NOTE — Progress Notes (Signed)
[facility] Progress Note   Subjective:   Patient seen and examined at bedside.  States "I feel terrible", did not sleep well, legs feel weak and had n/v last night.  Diarrhea improved.  Denies CP and SOB.   Objective Vitals:   02/12/21 1646 02/12/21 2049 02/13/21 0422 02/13/21 0952  BP: (!) 143/46 (!) 147/61 (!) 153/64 (!) 185/93  Pulse: 74 78 79 86  Resp: 17  18 14   Temp: 97.8 F (36.6 C) 98.1 F (36.7 C) 97.8 F (36.6 C) 98.1 F (36.7 C)  TempSrc: Oral  Oral   SpO2: 94% 100% 100% 99%  Weight:       Physical Exam General:chronically ill appearing, frail, elderly female in NAD Heart:RRR, no mrg Lungs:mostly CTAB, BS decreased in bases, nml WOB on 4L O2 via [place] Abdomen:soft, NTND Extremities:1+ LE edema Dialysis Access: [facility]   Filed Weights   02/09/21 2156 02/11/21 0750 02/11/21 1105  Weight: 56 kg 58 kg 56 kg    Intake/Output Summary (Last 24 hours) at 02/13/2021 1155 Last data filed at 02/13/2021 0900 Gross per 24 hour  Intake 360 ml  Output -  Net 360 ml    Additional Objective Labs: Basic Metabolic Panel: Recent Labs  Lab 02/11/21 0350 02/12/21 0505 02/13/21 0413  NA 139 138 140  K 3.6 4.0 4.1  CL 101 100 100  CO2 27 27 29   GLUCOSE 87 77 88  BUN 20 15 22   CREATININE 3.02* 2.45* 2.94*  CALCIUM 8.3* 8.4* 8.8*   Liver Function Tests: Recent Labs  Lab 02/08/21 1754 02/09/21 0456  AST 31 18  ALT 18 15  ALKPHOS 66 54  BILITOT 0.7 0.3  PROT 5.3* 4.6*  ALBUMIN 2.0* 1.6*   CBC: Recent Labs  Lab 02/08/21 1754 02/09/21 0456 02/10/21 0417 02/11/21 0350 02/12/21 0505 02/13/21 0413  WBC 8.9 7.7 8.3 8.6 8.7 11.3*  NEUTROABS 6.8 4.9  --   --   --   --   HGB 11.8* 10.6* 9.6* 8.6* 10.0* 10.5*  HCT 38.4 32.6* 29.7* 26.2* 32.1* 32.3*  MCV 101.6* 100.3* 99.7 99.2 100.3* 97.6  PLT 123* 121* 118* 120* 124* 143*   CBG: Recent Labs  Lab 02/12/21 0810 02/12/21 1215 02/12/21 1644 02/12/21 2050 02/13/21 0753  GLUCAP 85 95 101* 104* 87     Medications: . sodium chloride     . (feeding supplement) PROSource Plus  30 mL Oral BID BM  . allopurinol  100 mg Oral Daily  . amiodarone  200 mg Oral Daily  . brimonidine  1 drop Both Eyes BID  . brinzolamide  1 drop Both Eyes BID  . calcium carbonate  1 tablet Oral BID  . Chlorhexidine Gluconate Cloth  6 each Topical Q0600  . colestipol  1 g Oral Daily  . diclofenac Sodium  2 g Topical QHS  . dimethicone   Topical BID  . feeding supplement (NEPRO CARB STEADY)  237 mL Oral TID BM  . fluticasone  2 spray Each Nare Q12H  . heparin  5,000 Units Subcutaneous Q8H  . insulin aspart  0-6 Units Subcutaneous TID WC  . loratadine  10 mg Oral Daily  . melatonin  10 mg Oral QHS  . midodrine  10 mg Oral Q M,W,F-HD  . midodrine  5 mg Oral TID WC  . pantoprazole  40 mg Oral BID  . sodium chloride flush  3 mL Intravenous Q12H  . traZODone  50 mg Oral QHS    Dialysis  Orders: AF MWF  3h 94min 400/500 53kg 3K/2Ca TDC Heparin 3300 -No ESA/VDRA  Assessment/Plan: 1. Generalized weakness: No acute findings on admission. Per primary  2. Diarrhea/rectal pain: Chronic. Limits her ability to tolerate dialysis treatments. Followed by GI as outpatient. Diarrhea is better here. 3. ESRD -HD MWF. Truncated treatments last week d/t #2. HD 2/19 with no UF. HD completed 2/21 with net UF 2L. Plan for HD today in chair to see if tolerated.  4. HypOtension/volume - Low BP limits UF. Midodrine ^'d to 5 mg tid and 10mg  pre HD tiw.Continues to have edema on exam, No UF 2/19, 2L UF with HD 2/21.  CXR on admit with b/l pleural effusions. Plan for additional 2L today.  5. Anemia - Hgb stable, 10.5. Not on ESA. Follow trends.  6. Metabolic bone disease -Corr Ca ok. No VDRA/binders as OP. Follow trends  7. AFib - on amiodarone 8. Nutrition -Very low albumin. Add prot supp.  9. Debility- Very frail appearing. May be approaching point where she can no longer tolerate dialysis. Palliative  care consult on 2/19 -wants to remain full scope of care for now.   10. DMT2 - per primary.     Jen Mow, PA-C [facility] 02/13/2021,11:55 AM  LOS: 5 days

## 2021-02-13 NOTE — Plan of Care (Signed)
  Problem: Education: Goal: Individualized Educational Video(s) Outcome: Progressing   Problem: Health Behavior/Discharge Planning: Goal: Ability to manage health-related needs will improve Outcome: Progressing   Problem: Clinical Measurements: Goal: Complications related to the disease process, condition or treatment will be avoided or minimized Outcome: Progressing

## 2021-02-14 DIAGNOSIS — R531 Weakness: Secondary | ICD-10-CM | POA: Diagnosis not present

## 2021-02-14 LAB — CBC
HCT: 30.7 % — ABNORMAL LOW (ref 36.0–46.0)
Hemoglobin: 9.7 g/dL — ABNORMAL LOW (ref 12.0–15.0)
MCH: 31.2 pg (ref 26.0–34.0)
MCHC: 31.6 g/dL (ref 30.0–36.0)
MCV: 98.7 fL (ref 80.0–100.0)
Platelets: 140 10*3/uL — ABNORMAL LOW (ref 150–400)
RBC: 3.11 MIL/uL — ABNORMAL LOW (ref 3.87–5.11)
RDW: 17 % — ABNORMAL HIGH (ref 11.5–15.5)
WBC: 8.8 10*3/uL (ref 4.0–10.5)
nRBC: 0 % (ref 0.0–0.2)

## 2021-02-14 LAB — BASIC METABOLIC PANEL
Anion gap: 8 (ref 5–15)
BUN: 15 mg/dL (ref 8–23)
CO2: 28 mmol/L (ref 22–32)
Calcium: 8.1 mg/dL — ABNORMAL LOW (ref 8.9–10.3)
Chloride: 102 mmol/L (ref 98–111)
Creatinine, Ser: 2.1 mg/dL — ABNORMAL HIGH (ref 0.44–1.00)
GFR, Estimated: 23 mL/min — ABNORMAL LOW (ref >60)
Glucose, Bld: 91 mg/dL (ref 70–99)
Potassium: 4 mmol/L (ref 3.5–5.1)
Sodium: 138 mmol/L (ref 135–145)

## 2021-02-14 LAB — GLUCOSE, CAPILLARY
Glucose-Capillary: 72 mg/dL (ref 70–99)
Glucose-Capillary: 134 mg/dL — ABNORMAL HIGH (ref 70–99)
Glucose-Capillary: 94 mg/dL (ref 70–99)
Glucose-Capillary: 86 mg/dL (ref 70–99)

## 2021-02-14 LAB — SARS CORONAVIRUS 2 (TAT 6-24 HRS): SARS Coronavirus 2: NEGATIVE

## 2021-02-14 MED ORDER — SODIUM CHLORIDE 0.9 % IV SOLN: 100.0000 mL | INTRAVENOUS | Status: DC | PRN

## 2021-02-14 MED ORDER — MIDODRINE HCL 5 MG PO TABS: 5.0000 mg | ORAL_TABLET | Freq: Three times a day (TID) | ORAL | Status: DC

## 2021-02-14 MED ORDER — MIDODRINE HCL 5 MG PO TABS
2.5000 mg | ORAL_TABLET | Freq: Three times a day (TID) | ORAL | Status: DC
  Administered 2021-02-14 – 2021-02-15 (×3): 2.5 mg via ORAL
  Filled 2021-02-14 (×2): qty 1

## 2021-02-14 MED ORDER — BISACODYL 5 MG PO TBEC: 5.0000 mg | DELAYED_RELEASE_TABLET | Freq: Every day | ORAL | Status: DC | PRN

## 2021-02-14 NOTE — Progress Notes (Addendum)
[facility] Progress Note   Subjective:     [name] James seen and examined today at bedside. Appears comfortable. No issues or concerns at this time. Hospitalist also at bedside. Plan is for possible discharge back to [facility] today.  Objective Vitals:   02/13/21 1627 02/13/21 1739 02/13/21 2100 02/14/21 0456  BP: (!) 100/39 (!) 144/52 (!) 161/82 (!) 161/53  Pulse: 78 67 75 72  Resp: 17 16 15 16   Temp: 98.1 F (36.7 C) 98.4 F (36.9 C) 98.2 F (36.8 C) 98.2 F (36.8 C)  TempSrc: Oral     SpO2: 99% 100% 98% 100%  Weight:   57.1 kg    Physical Exam General: Frail elderly female; No acute respiratory distress Heart: S1 and S2; No murmurs, gallops, friction rub Lungs: Noted fine crackles L mid lobe Abdomen: Soft, round, non-tender Extremities: +1 edema bilateral hips lower extremities Dialysis Access: L chest [facility]  Filed Weights   02/11/21 1105 02/13/21 1320 02/13/21 2100  Weight: 56 kg 57.1 kg 57.1 kg    Intake/Output Summary (Last 24 hours) at 02/14/2021 0959 Last data filed at 02/14/2021 0800 Gross per 24 hour  Intake 270 ml  Output 718 ml  Net -448 ml    Additional Objective Labs: Basic Metabolic Panel: Recent Labs  Lab 02/12/21 0505 02/13/21 0413 02/14/21 0256  NA 138 140 138  K 4.0 4.1 4.0  CL 100 100 102  CO2 27 29 28   GLUCOSE 77 88 91  BUN 15 22 15   CREATININE 2.45* 2.94* 2.10*  CALCIUM 8.4* 8.8* 8.1*   Liver Function Tests: Recent Labs  Lab 02/08/21 1754 02/09/21 0456  AST 31 18  ALT 18 15  ALKPHOS 66 54  BILITOT 0.7 0.3  PROT 5.3* 4.6*  ALBUMIN 2.0* 1.6*   CBC: Recent Labs  Lab 02/08/21 1754 02/09/21 0456 02/10/21 0417 02/11/21 0350 02/12/21 0505 02/13/21 0413 02/14/21 0256  WBC 8.9 7.7 8.3 8.6 8.7 11.3* 8.8  NEUTROABS 6.8 4.9  --   --   --   --   --   HGB 11.8* 10.6* 9.6* 8.6* 10.0* 10.5* 9.7*  HCT 38.4 32.6* 29.7* 26.2* 32.1* 32.3* 30.7*  MCV 101.6* 100.3* 99.7 99.2 100.3* 97.6 98.7  PLT 123* 121* 118*  120* 124* 143* 140*   Blood Culture    Component Value Date/Time   SDES BLOOD LEFT ARM 10/30/2020 1525   SPECREQUEST  10/30/2020 1525    BOTTLES DRAWN AEROBIC ONLY Blood Culture results may not be optimal due to an inadequate volume of blood received in culture bottles   CULT  10/30/2020 1525    NO GROWTH 5 DAYS Performed at [facility], [place] [address] [place], [place] [address]    REPTSTATUS 11/04/2020 FINAL 10/30/2020 1525    CBG: Recent Labs  Lab 02/13/21 0753 02/13/21 1226 02/13/21 1753 02/13/21 2100 02/14/21 0655  GLUCAP 87 120* 75 102* 72    Lab Results  Component Value Date   INR 1.22 08/25/2018   INR 1.03 07/05/2018   INR 1.17 01/19/2017   Medications: . sodium chloride    . sodium chloride    . sodium chloride     . (feeding supplement) PROSource Plus  30 mL Oral BID BM  . allopurinol  100 mg Oral Daily  . amiodarone  200 mg Oral Daily  . brimonidine  1 drop Both Eyes BID  . brinzolamide  1 drop Both Eyes BID  . calcium carbonate  1 tablet Oral  BID  . Chlorhexidine Gluconate Cloth  6 each Topical Q0600  . colestipol  1 g Oral Daily  . diclofenac Sodium  2 g Topical QHS  . dimethicone   Topical BID  . feeding supplement (NEPRO CARB STEADY)  237 mL Oral TID BM  . fluticasone  2 spray Each Nare Q12H  . heparin  5,000 Units Subcutaneous Q8H  . insulin aspart  0-6 Units Subcutaneous TID WC  . loratadine  10 mg Oral Daily  . melatonin  10 mg Oral QHS  . midodrine  10 mg Oral Q M,W,F-HD  . midodrine  5 mg Oral TID WC  . pantoprazole  40 mg Oral BID  . sodium chloride flush  3 mL Intravenous Q12H  . traZODone  50 mg Oral QHS    Dialysis Orders:  AF MWF 3h 68min 400/500 EDW 53kg 3K/2Ca  TDC Heparin 3300 bolus No ESA/VDRA   Assessment/Plan:  1. Generalized weakness: No acute findings on admission. Per primary  2. Diarrhea/rectal pain: Chronic. Limits her ability to tolerate dialysis treatments. Followed by GI as outpatient.  3. ESRD -HD  MWF. Truncated treatments last week d/t #2. HD 2/19 with no UF. HDcompleted 02/13/21 with net UF 742ml. Patient is for possible discharge today; will place HD orders for 02/14/21 if patient is not discharged.  4. HypOtension/volume - Having difficulty removing fluid d/t low BP trends during HD treatments. Noted BP trend sys 130-160s. Continues to have edema on exam, No UF2/19,2L UF with HD 2/21, and UF 73ml with HD on 02/13/21. CXR on admit with b/l pleural effusions. Plan is to continue Midodrine course 10mg  with HD (Mon, Wed, Fri) and 5mg  TID. We will not increase Midodrine at this time. 5. Anemia - Hgb stable, 9.7. Not on ESA. Follow trends.  6. Metabolic bone disease -Corr Ca ok. No VDRA/binders as OP. Follow trends  7. AFib - on amiodarone 8. Nutrition -Very low albumin. Add prot supp.  9. Debility- Very frail appearing. May be approaching point where she can no longer tolerate dialysis. Palliative care consult on 2/19 -wants to remain full scope of care for now.   10. DMT2 - per primary. 11. Discharge Planning: Per Hospitalist, plan for possible d/c back to [facility] today. Recommend placing patient in chair today X 4hrs to ensure she will tolerate outpatient HD. If patient tolerates, she can be discharged from our standpoint. If not discharged, HD orders will be placed for 02/14/21.    Tobie Poet, NP [facility] 02/14/2021,9:59 AM  LOS: 6 days

## 2021-02-14 NOTE — [place] FL2 (Signed)
[place] MEDICAID FL2 LEVEL OF CARE SCREENING TOOL     IDENTIFICATION  Patient Name: [name] James Birthdate: 1936/09/29 Sex: female Admission Date (Current Location): 02/08/2021  [place] and [place] Number:  Kathleen Argue [id number] [place] and Address:  [facility], [place] [address], [place], [place] [address]      Provider Number: [id number]  Attending Physician Name and Address:  Terrilee Croak, MD  Relative Name and Phone Number:  Coralyn Mark - daughter; [phone]    Current Level of Care: Hospital Recommended Level of Care: [facility] for approx. 7 years) Prior Approval Number:    Date Approved/Denied:   PASRR Number: [id number]  Discharge Plan: SNF    Current Diagnoses: Patient Active Problem List   Diagnosis Date Noted   Generalized weakness 02/08/2021   General weakness 02/08/2021   Atrial fibrillation with RVR ([place]) 08/03/2020   Nausea and vomiting 03/14/2019   Chronic diarrhea 03/14/2019   Dysuria 03/14/2019   UTI (urinary tract infection) 03/14/2019   Aortic atherosclerosis ([place]) 12/30/2018   Hypoalbuminemia due to protein-calorie malnutrition ([place]) 12/30/2018   Tachycardia-bradycardia syndrome ([place])    Symptomatic bradycardia 08/24/2018   Gout 08/23/2018   Anxiety 08/23/2018   Pneumothorax, left 10/13/2017   Multiple lung nodules on CT 10/06/2017   Pleural effusion, left 10/06/2017   Diabetic polyneuropathy associated with type 2 diabetes mellitus ([place]) 07/30/2017   Onychomycosis of toenail 07/30/2017   Idiopathic chronic venous hypertension of both lower extremities with inflammation 07/30/2017   Chronic respiratory failure with hypoxia ([place]) 04/29/2017   Advance care planning    Goals of care, counseling/discussion    Palliative care by specialist    Pressure ulcer 01/19/2017   History of pressure ulcer 10/17/2016   GAD (generalized anxiety disorder) 10/17/2016    Osteopenia determined by x-ray 04/17/2016   Type 2 diabetes mellitus with diabetic neuropathy ([place]) 06/08/2015   Type 2 diabetes, controlled, with renal manifestation ([place]) 06/08/2015   PVD (peripheral vascular disease) ([place]) 06/08/2015   Swelling of limb-Left arm 04/10/2015   Nausea with vomiting 01/12/2015   Normocytic anemia 01/12/2015   Paroxysmal atrial fibrillation ([place]) 12/22/2014   Melena    ESRD on dialysis ([place]) 12/19/2014   Chronic diastolic (congestive) heart failure ([place]) 12/19/2014   Palpitation 12/19/2014   Anemia, chronic disease 12/13/2014   Pulmonary edema 11/10/2014   Physical deconditioning 10/29/2014   Gastritis and gastroduodenitis 10/24/2014   Heme positive stool 10/24/2014   Morbid obesity ([place]) 10/18/2014   Edema 10/18/2014   Essential hypertension 10/17/2014   DM type 2, uncontrolled, with renal complications ([place]) 70/62/3762   Dyslipidemia 10/17/2014   Status post cataract extraction and insertion of intraocular lens 07/28/2012   Hypertensive CKD (chronic kidney disease) 07/19/2012   Combined form of senile cataract 06/21/2012   Fuchs' corneal dystrophy 06/21/2012    Orientation RESPIRATION BLADDER Height & Weight     Self  O2 (1 to 2 Liters oxygen) Incontinent Weight: 125 lb 14.1 oz (57.1 kg) Height:     BEHAVIORAL SYMPTOMS/MOOD NEUROLOGICAL BOWEL NUTRITION STATUS      Incontinent Diet (Renal with 1200 mL fluid restriction)  AMBULATORY STATUS COMMUNICATION OF NEEDS Skin   Total Care Verbally Other (Comment) (Ecchymosis bilateral arm with sacral foam dressing)                       Personal Care Assistance Level of Assistance  Bathing,Feeding,Dressing Bathing Assistance: Maximum assistance Feeding assistance: Limited assistance (Assistance with  set-up) Dressing Assistance: Maximum assistance     Functional Limitations Info  Sight,Hearing,Speech Sight Info: Impaired Hearing Info: Adequate Speech Info: Adequate     SPECIAL CARE FACTORS FREQUENCY                       Contractures      Additional Factors Info  Code Status,Allergies Code Status Info: Full Allergies Info: Erythromycin, Codeine, Erythromycin Base, Penicillins, Zantac, (Ranitidine Hcl)           Current Medications (02/14/2021):  This is the current hospital active medication list Current Facility-Administered Medications  Medication Dose Route Frequency Provider Last Rate Last Admin   (feeding supplement) PROSource Plus liquid 30 mL  30 mL Oral BID BM Lynnda Child, PA-C   30 mL at 02/14/21 0829   0.9 %  sodium chloride infusion  250 mL Intravenous PRN Emeterio Reeve, DO       0.9 %  sodium chloride infusion  100 mL Intravenous PRN Ejigiri, Thomos Lemons, PA-C       0.9 %  sodium chloride infusion  100 mL Intravenous PRN Ejigiri, Thomos Lemons, PA-C       0.9 %  sodium chloride infusion  100 mL Intravenous PRN Adelfa Koh, NP       0.9 %  sodium chloride infusion  100 mL Intravenous PRN Adelfa Koh, NP       acetaminophen (TYLENOL) tablet 650 mg  650 mg Oral Q6H PRN Emeterio Reeve, DO   650 mg at 02/09/21 0900   Or   acetaminophen (TYLENOL) suppository 650 mg  650 mg Rectal Q6H PRN Emeterio Reeve, DO       allopurinol (ZYLOPRIM) tablet 100 mg  100 mg Oral Daily Emeterio Reeve, DO   100 mg at 02/14/21 4315   alteplase (CATHFLO ACTIVASE) injection 2 mg  2 mg Intracatheter Once PRN Lynnda Child, PA-C       amiodarone (PACERONE) tablet 200 mg  200 mg Oral Daily Emeterio Reeve, DO   200 mg at 02/14/21 4008   brimonidine (ALPHAGAN) 0.2 % ophthalmic solution 1 drop  1 drop Both Eyes BID Emeterio Reeve, DO   1 drop at 02/14/21 0830   brinzolamide (AZOPT) 1 % ophthalmic suspension 1 drop  1 drop Both Eyes BID Emeterio Reeve, DO   1 drop at 02/14/21 6761   calcium carbonate (dosed in mg elemental calcium) suspension 500 mg of elemental calcium  500 mg of  elemental calcium Oral Q6H PRN Emeterio Reeve, DO   500 mg of elemental calcium at 02/10/21 0957   calcium carbonate (TUMS - dosed in mg elemental calcium) chewable tablet 200 mg of elemental calcium  1 tablet Oral BID Emeterio Reeve, DO   200 mg of elemental calcium at 02/14/21 0830   camphor-menthol (SARNA) lotion 1 application  1 application Topical P5K PRN Emeterio Reeve, DO       Chlorhexidine Gluconate Cloth 2 % PADS 6 each  6 each Topical Q0600 Lynnda Child, PA-C   6 each at 02/14/21 1057   colestipol (COLESTID) tablet 1 g  1 g Oral Daily Emeterio Reeve, DO   1 g at 02/14/21 9326   diclofenac Sodium (VOLTAREN) 1 % topical gel 2 g  2 g Topical QHS Emeterio Reeve, DO   2 g at 02/12/21 2159   dimethicone 1 % cream   Topical BID Bonnielee Haff, MD   Given at 02/14/21 1056   diphenoxylate-atropine (LOMOTIL) 2.5-0.025  MG per tablet 1 tablet  1 tablet Oral Q6H PRN Emeterio Reeve, DO       feeding supplement (NEPRO CARB STEADY) liquid 237 mL  237 mL Oral TID BM Bonnielee Haff, MD   237 mL at 02/12/21 1927   fluticasone (FLONASE) 50 MCG/ACT nasal spray 2 spray  2 spray Each Nare Q12H Emeterio Reeve, DO   2 spray at 02/14/21 3474   heparin injection 1,000 Units  1,000 Units Dialysis PRN Lynnda Child, PA-C       heparin injection 5,000 Units  5,000 Units Subcutaneous Q8H Emeterio Reeve, DO   5,000 Units at 02/14/21 2595   HYDROcodone-acetaminophen (NORCO/VICODIN) 5-325 MG per tablet 1 tablet  1 tablet Oral Q6H PRN Bonnielee Haff, MD   1 tablet at 02/13/21 1839   insulin aspart (novoLOG) injection 0-6 Units  0-6 Units Subcutaneous TID WC Emeterio Reeve, DO       lidocaine (PF) (XYLOCAINE) 1 % injection 5 mL  5 mL Intradermal PRN Lynnda Child, PA-C       lidocaine-prilocaine (EMLA) cream 1 application  1 application Topical PRN Lynnda Child, PA-C       loperamide (IMODIUM) capsule 4 mg  4 mg Oral Q8H PRN Emeterio Reeve, DO       loratadine (CLARITIN) tablet 10 mg  10 mg Oral Daily Emeterio Reeve, DO   10 mg at 02/14/21 6387   melatonin tablet 10 mg  10 mg Oral QHS Emeterio Reeve, DO   10 mg at 02/13/21 2134   midodrine (PROAMATINE) tablet 10 mg  10 mg Oral Q M,W,F-HD Lynnda Child, PA-C   10 mg at 02/13/21 1225   midodrine (PROAMATINE) tablet 5 mg  5 mg Oral TID WC Bonnielee Haff, MD   5 mg at 02/14/21 0829   ondansetron (ZOFRAN) tablet 4 mg  4 mg Oral Q6H PRN Emeterio Reeve, DO       Or   ondansetron [facility]) injection 4 mg  4 mg Intravenous Q6H PRN Emeterio Reeve, DO   4 mg at 02/12/21 5643   pantoprazole (PROTONIX) EC tablet 40 mg  40 mg Oral BID Emeterio Reeve, DO   40 mg at 02/14/21 3295   pentafluoroprop-tetrafluoroeth (GEBAUERS) aerosol 1 application  1 application Topical PRN Lynnda Child, PA-C       promethazine (PHENERGAN) tablet 12.5 mg  12.5 mg Oral BID PRN Emeterio Reeve, DO       sodium chloride flush (NS) 0.9 % injection 3 mL  3 mL Intravenous Q12H Emeterio Reeve, DO   3 mL at 02/14/21 0831   sodium chloride flush (NS) 0.9 % injection 3 mL  3 mL Intravenous PRN Emeterio Reeve, DO       sorbitol 70 % solution 30 mL  30 mL Oral PRN Emeterio Reeve, DO   30 mL at 02/14/21 1056   traMADol (ULTRAM) tablet 50-100 mg  50-100 mg Oral Q6H PRN Bonnielee Haff, MD   100 mg at 02/13/21 2136   traZODone (DESYREL) tablet 50 mg  50 mg Oral QHS Emeterio Reeve, DO   50 mg at 02/13/21 2133     Discharge Medications: Please see discharge summary for a list of discharge medications.  Relevant Imaging Results:  Relevant Lab Results:   Additional Information ss#[id number]. HD patient MWF at [facility]. Chair time 10:15 am  Sharlet Salina Mila Homer, LCSW

## 2021-02-14 NOTE — Discharge Summary (Signed)
PROGRESS NOTE  [name] James  DOB: 08-01-1936  PCP: Patrick Jupiter OQH:[id number]  DOA: 02/08/2021  LOS: 7 days   Chief Complaint  Patient presents with  . Weakness    Brief narrative: [name] James is a 85 y.o. female with PMH significant for ESRD on HD MWF, diastolic CHF, anemia of chronic disease, chronic diarrhea who is a resident of [facility] and rehab. Patient has had chronic diarrhea with adjustments made to her treatment by her gastroenterologist, Dr. Hilarie Fredrickson.  As result of her persistent diarrhea patient has had to cut short her dialysis treatments. This has occurred on numerous occasions.   On 02/08/2021, patient presented mainly with the generalized weakness due to persistent diarrhea, having missed dialysis and worsening edema in the lower extremities.   She was noted to be fluid overloaded. She was hospitalized for further management. See below for details.  Subjective: Patient was seen and examined this morning. Pleasant elderly Caucasian female.  Lying on bed.  Not in distress.  She was able to sit on the chair yesterday.  Plan for dialysis this morning and discharge back to nursing home after that.  Assessment/Plan: Volume overload ESRD-HD-MWF -Presented with a state of volume overload based on significant lower extremity edema, bilateral pleural effusion.  -Seen by nephrology.  Reinitiated in dialysis.  Drop in blood pressure has been a challenge during dialysis.  However while in the hospital, patient blood pressure has stayed in the range during dialysis.  Continue midodrine at current dose.  Chronic diarrhea -Followed by Dr. Hilarie Fredrickson with gastroenterology.  Looks like she was recently started on colestipol.  Also on Lomotil and Imodium.   -Not having significant diarrhea here in the hospital.  Abdomen remains benign.    Chronic nausea and vomiting -Antiemetics as needed.  Anemia of chronic kidney disease -Hemoglobin stable at this time.  No active  bleeding. Recent Labs    08/05/20 1428 08/06/20 0802 02/11/21 0350 02/12/21 0505 02/13/21 0413 02/14/21 0256 02/15/21 0244  HGB  --    < > 8.6* 10.0* 10.5* 9.7* 9.2*  MCV  --    < > 99.2 100.3* 97.6 98.7 98.6  VITAMINB12 964*  --   --   --   --   --   --   FOLATE 67.5  --   --   --   --   --   --    < > = values in this interval not displayed.   Diabetes mellitus type 2 Hypoglycemia -A1c 4.6 on 02/08/2021 -Currently on sliding scale insulin with Accu-Cheks. -Because of poor oral intake, blood sugar is running on lower side of normal. Continue to encourage oral intake.  Continue nutritional supplements. Recent Labs  Lab 02/14/21 0655 02/14/21 1249 02/14/21 1759 02/14/21 2108 02/15/21 0637  GLUCAP 72 134* 94 86 86   Pleural effusions -Chest x-ray on admission showed moderate bilateral pleural effusion, related to volume overload. Did not have thoracentesis done. -Currently on 2 L oxygen by nasal cannula.   H/o atrial fibrillation H/o sick sinus syndrome -Aspirin not on anticoagulation because of history of GI bleeding. -Not a candidate for pacemaker per cardiology note. -Echocardiogram from 02/09/2021 showed EF of 70 to 75%, moderate LVH.  History of gout -Continue allopurinol.  History of generalized anxiety and insomnia -Holding sedative agents.  History of cognitive impairment -Chronic microvascular disease noted on CT brain. Seems to be somewhat distracted but does not have any focal deficits.  Glaucoma Continue eyedrops.  History of  pressure injuries Preventive care. Pressure Injury 08/03/20 Sacrum Stage 2 -  Partial thickness loss of dermis presenting as a shallow open injury with a red, pink wound bed without slough. (Active)  08/03/20 2230  Location: Sacrum  Location Orientation:   Staging: Stage 2 -  Partial thickness loss of dermis presenting as a shallow open injury with a red, pink wound bed without slough.  Wound Description (Comments):    Present on Admission: Yes   Goals of care Prognosis is guarded.  I had a long conversation with patient's daughter [name] on 2/24, she understands that patient is gradually getting weak.  She sees the possibility of her not being able to continue with dialysis for long-term.  For now however, she wants to continue dialysis because she is afraid that without dialysis, her mom would not live more than few days.  She tells me that she will respect the wishes of her mom if her mom expresses any wish to stop dialysis.  Remains full code at this time  Wound care: Incision (Closed) 08/27/18 Arm Left (Active)  Date First Assessed/Time First Assessed: 08/27/18 1229   Location: Arm  Location Orientation: Left    Assessments 08/27/2018 12:49 PM 08/28/2018  7:26 AM  Dressing Type Liquid skin adhesive Liquid skin adhesive  Dressing Clean;Dry;Intact Clean;Dry;Intact  Site / Wound Assessment -- Clean;Dry  Closure -- Skin glue  Drainage Amount -- None     No Linked orders to display     Incision (Closed) 11/17/18 Arm Right (Active)  Date First Assessed/Time First Assessed: 11/17/18 1315   Location: Arm  Location Orientation: Right  Present on Admission: No    Assessments 11/17/2018  1:15 PM  Dressing Type Gauze (Comment);Transparent dressing  Dressing Clean;Intact;Dry  Dressing Change Frequency PRN  Site / Wound Assessment Clean;Dry  Margins Attached edges (approximated)  Closure None  Drainage Amount None  Drainage Description No odor     No Linked orders to display     Incision (Closed) 12/13/19 Arm Right (Active)  Date First Assessed/Time First Assessed: 12/13/19 1427   Location: Arm  Location Orientation: Right    Assessments 12/13/2019  3:46 PM  Dressing Type Liquid skin adhesive  Dressing Clean;Dry;Intact  Site / Wound Assessment Dressing in place / Unable to assess  Drainage Amount None     No Linked orders to display     Pressure Injury 08/03/20 Sacrum Stage 2 -  Partial  thickness loss of dermis presenting as a shallow open injury with a red, pink wound bed without slough. (Active)  Date First Assessed/Time First Assessed: 08/03/20 2230   Location: Sacrum  Staging: Stage 2 -  Partial thickness loss of dermis presenting as a shallow open injury with a red, pink wound bed without slough.  Present on Admission: Yes    Assessments 08/03/2020 10:30 PM 02/14/2021  4:54 PM  Dressing Type Foam - Lift dressing to assess site every shift Foam - Lift dressing to assess site every shift  Dressing Clean;Dry;Intact Changed;Other (Comment)  Dressing Change Frequency PRN PRN  Site / Wound Assessment Clean;Dry --  Peri-wound Assessment -- Pink  Margins -- Attached edges (approximated)  Drainage Amount Scant None  Drainage Description -- Odor  Treatment Other (Comment) Cleansed     No Linked orders to display    Discharge Exam:   Vitals:   02/14/21 2120 02/14/21 2122 02/15/21 0421 02/15/21 0855  BP: (!) 187/61 (!) 163/65 (!) 151/54 (!) 152/53  Pulse: 77 75 68 85  Resp: 19   18  Temp: 98.3 F (36.8 C)  98.5 F (36.9 C) 97.9 F (36.6 C)  TempSrc:    Oral  SpO2: 100%  100% 100%  Weight: 59.1 kg       Body mass index is 22.36 kg/m.  General exam: Elderly Caucasian female.  Not in distress Skin: No rashes, lesions or ulcers. HEENT: Atraumatic, normocephalic, no obvious bleeding Lungs: Clear to auscultation bilaterally CVS: Regular rate and rhythm, no murmur GI/Abd soft, nontender, nondistended, bowel sounds CNS: Alert, awake, oriented to place and person Psychiatry: Mood appropriate Extremities: Trace bilateral pedal edema  Follow ups:   Discharge Instructions    Diet - low sodium heart healthy   Complete by: As directed    Discharge wound care:   Complete by: As directed    Keep wound clean and dry   Increase activity slowly   Complete by: As directed       Follow-up Information    [facility].   Specialty: Skilled Nursing  Facility Contact information: [place] [address] [phone]        Sanda Klein, MD .   Specialty: Cardiology Contact information: [address] [place] [address] [phone]               Recommendations for Outpatient Follow-Up:   1. Follow-up with PCP as an outpatient  Discharge Instructions:  Follow with Primary [facility] in 7 days   Get CBC/BMP checked in next visit within 1 week by PCP or SNF MD ( we routinely change or add medications that can affect your baseline labs and fluid status, therefore we recommend that you get the mentioned basic workup next visit with your PCP, your PCP may decide not to get them or add new tests based on their clinical decision)  On your next visit with your PCP, please Get Medicines reviewed and adjusted.  Please request your PCP  to go over all Hospital Tests and Procedure/Radiological results at the follow up, please get all Hospital records sent to your Prim MD by signing hospital release before you go home.  Activity: As tolerated with Full fall precautions use walker/cane & assistance as needed  For Heart failure patients - Check your Weight same time everyday, if you gain over 2 pounds, or you develop in leg swelling, experience more shortness of breath or chest pain, call your Primary MD immediately. Follow Cardiac Low Salt Diet and 1.5 lit/day fluid restriction.  If you have smoked or chewed Tobacco in the last 2 yrs please stop smoking, stop any regular Alcohol  and or any Recreational drug use.  If you experience worsening of your admission symptoms, develop shortness of breath, life threatening emergency, suicidal or homicidal thoughts you must seek medical attention immediately by calling 911 or calling your MD immediately  if symptoms less severe.  You Must read complete instructions/literature along with all the possible adverse reactions/side effects for all the Medicines  you take and that have been prescribed to you. Take any new Medicines after you have completely understood and accpet all the possible adverse reactions/side effects.   Do not drive, operate heavy machinery, perform activities at heights, swimming or participation in water activities or provide baby sitting services if your were admitted for syncope or siezures until you have seen by Primary MD or a Neurologist and advised to do so again.  Do not drive when taking Pain medications.  Do not take  more than prescribed Pain, Sleep and Anxiety Medications  Wear Seat belts while driving.   Please note You were cared for by a hospitalist during your hospital stay. If you have any questions about your discharge medications or the care you received while you were in the hospital after you are discharged, you can call the unit and asked to speak with the hospitalist on call if the hospitalist that took care of you is not available. Once you are discharged, your primary care physician will handle any further medical issues. Please note that NO REFILLS for any discharge medications will be authorized once you are discharged, as it is imperative that you return to your primary care physician (or establish a relationship with a primary care physician if you do not have one) for your aftercare needs so that they can reassess your need for medications and monitor your lab values.    Allergies as of 02/15/2021      Reactions   Erythromycin Swelling   Codeine Palpitations, Rash, Other (See Comments)   GI Upset   Erythromycin Base Other (See Comments)   GI Upset   Penicillins Rash, Other (See Comments)   Has taken Keflex & Rocephin many times without problems Documented on MAR   Zantac [ranitidine Hcl] Nausea And Vomiting      Medication List    TAKE these medications   (feeding supplement) PROSource Plus liquid Take 30 mLs by mouth 2 (two) times daily between meals.   feeding supplement (NEPRO CARB  STEADY) Liqd Take 237 mLs by mouth 3 (three) times daily between meals.   acetaminophen 325 MG tablet Commonly known as: TYLENOL Take 650 mg by mouth every 6 (six) hours as needed for fever (greater than 100 F).   allopurinol 100 MG tablet Commonly known as: ZYLOPRIM Take 100 mg by mouth daily.   amiodarone 200 MG tablet Commonly known as: Pacerone Take 1 tablet (200 mg total) by mouth daily.   aspirin 81 MG chewable tablet Chew 81 mg by mouth daily.   b complex-vitamin c-folic acid 0.8 MG Tabs tablet Take 1 tablet by mouth daily.   Biofreeze 4 % Gel Generic drug: Menthol (Topical Analgesic) Apply 1 application topically 3 (three) times daily as needed (bilateral knee pain).   Brinzolamide-Brimonidine 1-0.2 % Susp Place 1 drop into both eyes 2 (two) times daily.   calcium carbonate 500 MG chewable tablet Commonly known as: TUMS - dosed in mg elemental calcium Chew 1 tablet by mouth 2 (two) times daily.   cetirizine 10 MG tablet Commonly known as: ZYRTEC Take 10 mg by mouth daily.   colestipol 1 g tablet Commonly known as: COLESTID Take 1 g by mouth daily.   collagenase ointment Commonly known as: SANTYL Apply 1 application topically daily. Open ulcer to left posterior flank wound bed   diazepam 5 MG tablet Commonly known as: Valium Take 1 tablet (5 mg total) by mouth at bedtime.   diclofenac Sodium 1 % Gel Commonly known as: VOLTAREN Apply 2 g topically See admin instructions. Apply 2 grams to the right knee at bedtime for pain   diphenoxylate-atropine 2.5-0.025 MG tablet Commonly known as: LOMOTIL Take 1 tablet by mouth every 6 (six) hours as needed for diarrhea or loose stools. Take 1 tablet by mouth every 6 hours PRN for diarrhea or loose stools   diphenoxylate-atropine 2.5-0.025 MG tablet Commonly known as: LOMOTIL Take 1 tablet by mouth See admin instructions. Monday,Wednesday and Friday prior to hemodialysis   fluticasone  50 MCG/ACT nasal  spray Commonly known as: FLONASE Place 2 sprays into both nostrils every 12 (twelve) hours.   HM Vitamin C 500 MG tablet Generic drug: ascorbic acid Take 500 mg by mouth daily.   ipratropium 0.03 % nasal spray Commonly known as: ATROVENT Place 2 sprays into both nostrils every 12 (twelve) hours.   loperamide 2 MG tablet Commonly known as: IMODIUM A-D Take 2 tablets (4 mg total) by mouth every 8 (eight) hours as needed for diarrhea or loose stools (for diarrhea). What changed:   when to take this  additional instructions   Melatonin 5 MG Caps Take 10 mg by mouth at bedtime.   midodrine 10 MG tablet Commonly known as: PROAMATINE Take 1 tablet (10 mg total) by mouth every Monday, Wednesday, and Friday with hemodialysis. What changed:   when to take this  additional instructions   midodrine 2.5 MG tablet Commonly known as: PROAMATINE Take 1 tablet (2.5 mg total) by mouth 3 (three) times daily with meals. What changed: additional instructions   OXYGEN Inhale 2 L into the lungs continuous.   pantoprazole 40 MG tablet Commonly known as: PROTONIX Take 1 tablet (40 mg total) by mouth 2 (two) times daily.   polyethylene glycol 17 g packet Commonly known as: MIRALAX / GLYCOLAX Take 17 g by mouth daily as needed for moderate constipation.   promethazine 12.5 MG tablet Commonly known as: PHENERGAN Take 12.5 mg by mouth 2 (two) times daily as needed for nausea/vomiting. What changed: Another medication with the same name was removed. Continue taking this medication, and follow the directions you see here.   SALINE NASAL MIST NA Place 2 sprays into the nose 3 (three) times daily.   Secura Dimethicone Protectant 5 % Crea Generic drug: DIMETHICONE (TOPICAL) Apply 1 application topically See admin instructions. Apply a nickel-sized amount onto the buttocks area 2 times a day for barrier protection   sodium phosphate 7-19 GM/118ML Enem Place 1 enema rectally daily as needed  for severe constipation.   traMADol 50 MG tablet Commonly known as: ULTRAM Take 1-2 tablets (50-100 mg total) by mouth See admin instructions. Take 1 tablet (50 mg totally) by mouth every 6 hours as needed for mild - moderate pain; take 2 tablets (100mg  totally) by mouth as needed for moderate - severe pain   traZODone 50 MG tablet Commonly known as: DESYREL Take 1 tablet (50 mg total) by mouth at bedtime.            Discharge Care Instructions  (From admission, onward)         Start     Ordered   02/15/21 0000  Discharge wound care:       Comments: Keep wound clean and dry   02/15/21 1039          Time coordinating discharge: 35 minutes  The results of significant diagnostics from this hospitalization (including imaging, microbiology, ancillary and laboratory) are listed below for reference.    Procedures and Diagnostic Studies:   DG Chest 2 View  Result Date: 02/08/2021 CLINICAL DATA:  Dyspnea, missed dialysis EXAM: CHEST - 2 VIEW COMPARISON:  10/29/2020, CT 10/29/2020 FINDINGS: Left-sided central venous catheter with tips over the right atrium. Moderate bilateral pleural effusions and basilar airspace disease. Cardiomediastinal silhouette within normal limits. Aortic atherosclerosis. No pneumothorax. IMPRESSION: Moderate bilateral pleural effusions with basilar airspace disease. Electronically Signed   By: Donavan Foil M.D.   On: 02/08/2021 18:37   CT Head Wo Contrast  Result Date: 02/08/2021 CLINICAL DATA:  Weakness for 1 week, nausea, vomiting and diarrhea EXAM: CT HEAD WITHOUT CONTRAST TECHNIQUE: Contiguous axial images were obtained from the base of the skull through the vertex without intravenous contrast. COMPARISON:  CT 09/14/2020 FINDINGS: Brain: No evidence of acute infarction, hemorrhage, hydrocephalus, visible mass lesion or mass effect. Diffuse prominence of the ventricles, cisterns and sulci compatible with parenchymal volume loss. A more focal region of  extra-axial CSF attenuation across the left frontal lobe is unchanged from prior and may be related to some asymmetric volume loss versus a chronic subdural/hygroma without acute hemorrhagic component. Patchy areas of white matter hypoattenuation are most compatible with chronic microvascular angiopathy. Vascular: Scattered vascular calcifications noted across the carotid siphons and intradural vertebral arteries as well as additional more superficial vascular calcifications seen along the right cerebral convexity as well, unchanged from prior. No worrisome hyperdense vessel or unexpected calcification is seen. Skull: The osseous structures appear diffusely demineralized which may limit detection of small or nondisplaced fractures. Hyperostosis frontalis interna is a benign, often incidental finding. No acute fracture or worrisome osseous lesion. No scalp swelling or hematoma. Some calcific pannus formation noted posterior to the dens. Sinuses/Orbits: Paranasal sinuses and mastoid air cells are predominantly clear. Orbital structures are unremarkable aside from prior lens extractions. Other: None. IMPRESSION: 1. No acute intracranial abnormality. 2. Stable parenchymal volume loss and chronic microvascular angiopathy. 3. Stable appearance of a more focal region of extra-axial CSF attenuation across the left frontal convexity possibly asymmetric volume loss versus a chronic subdural/hygroma without acute hemorrhagic component. 4. Osseous structures appear diffusely demineralized which may limit detection of small or nondisplaced fractures. Electronically Signed   By: [name] M.D.   On: 02/08/2021 19:42   DG Abd 2 Views  Result Date: 02/08/2021 CLINICAL DATA:  Dyspnea EXAM: ABDOMEN - 2 VIEW COMPARISON:  CT 01/22/2016 FINDINGS: Bilateral pleural effusions and basilar airspace disease. Limited by habitus and positioning. No gross free air. Nonobstructed gas pattern. Extensive vascular calcification. Numerous  pelvic calcifications presumably phleboliths. IMPRESSION: Nonobstructed gas pattern. Bilateral pleural effusions and basilar airspace disease. Electronically Signed   By: Donavan Foil M.D.   On: 02/08/2021 18:39   ECHOCARDIOGRAM COMPLETE  Result Date: 02/09/2021    ECHOCARDIOGRAM REPORT   Patient Name:   INGRA ROTHER Whatley Date of Exam: 02/09/2021 Medical Rec #:  [id number]       Height:       64.0 in Accession #:    [id number]      Weight:       123.5 lb Date of Birth:  November 10, 1936       BSA:          1.594 m Patient Age:    62 years        BP:           91/48 mmHg Patient Gender: F               HR:           68 bpm. Exam Location:  Inpatient Procedure: 2D Echo, Color Doppler and Cardiac Doppler Indications:    I50.9* Heart failure (unspecified)  History:        Patient has prior history of Echocardiogram examinations, most                 recent 07/06/2018. ESRD on HD.  Sonographer:    Merrie Roof RDCS Referring Phys: [id number] [employer]  1. Left ventricular ejection fraction, by  estimation, is 70 to 75%. The left ventricle has hyperdynamic function. The left ventricle has no regional wall motion abnormalities. There is moderate left ventricular hypertrophy. Left ventricular diastolic parameters are indeterminate.  2. Right ventricular systolic function is normal. The right ventricular size is normal. There is mildly elevated pulmonary artery systolic pressure. The estimated right ventricular systolic pressure is 47.0 mmHg.  3. Left atrial size was moderately dilated.  4. The mitral valve is abnormal. No evidence of mitral valve regurgitation. No evidence of mitral stenosis. Severe mitral annular calcification.  5. Tricuspid valve regurgitation is moderate.  6. The aortic valve was not well visualized. Aortic valve regurgitation is trivial. Mild to moderate aortic valve sclerosis/calcification is present, without any evidence of aortic stenosis. FINDINGS  Left Ventricle: Left ventricular ejection  fraction, by estimation, is 70 to 75%. The left ventricle has hyperdynamic function. The left ventricle has no regional wall motion abnormalities. The left ventricular internal cavity size was small. There is moderate left ventricular hypertrophy. Left ventricular diastolic parameters are indeterminate. Right Ventricle: The right ventricular size is normal. No increase in right ventricular wall thickness. Right ventricular systolic function is normal. There is mildly elevated pulmonary artery systolic pressure. The tricuspid regurgitant velocity is 3.23  m/s, and with an assumed right atrial pressure of 3 mmHg, the estimated right ventricular systolic pressure is 96.2 mmHg. Left Atrium: Left atrial size was moderately dilated. Right Atrium: Right atrial size was normal in size. Pericardium: There is no evidence of pericardial effusion. Mitral Valve: The mitral valve is abnormal. Severe mitral annular calcification. No evidence of mitral valve regurgitation. No evidence of mitral valve stenosis. Tricuspid Valve: The tricuspid valve is normal in structure. Tricuspid valve regurgitation is moderate. Aortic Valve: The aortic valve was not well visualized. Aortic valve regurgitation is trivial. Mild to moderate aortic valve sclerosis/calcification is present, without any evidence of aortic stenosis. Aortic valve mean gradient measures 4.0 mmHg. Aortic  valve peak gradient measures 8.8 mmHg. Aortic valve area, by VTI measures 2.00 cm. Pulmonic Valve: The pulmonic valve was not well visualized. Pulmonic valve regurgitation is not visualized. Aorta: The aortic root and ascending aorta are structurally normal, with no evidence of dilitation. IAS/Shunts: No atrial level shunt detected by color flow Doppler.  LEFT VENTRICLE PLAX 2D LVIDd:         3.20 cm LVIDs:         2.20 cm LV PW:         1.10 cm LV IVS:        1.20 cm LVOT diam:     1.90 cm LV SV:         48 LV SV Index:   30 LVOT Area:     2.84 cm  LV Volumes (MOD) LV  vol d, MOD A4C: 36.1 ml LV vol s, MOD A4C: 7.7 ml LV SV MOD A4C:     36.1 ml RIGHT VENTRICLE RV Basal diam:  3.20 cm LEFT ATRIUM           Index       RIGHT ATRIUM           Index LA diam:      3.60 cm 2.26 cm/m  RA Area:     15.40 cm LA Vol (A2C): 52.2 ml 32.75 ml/m RA Volume:   40.80 ml  25.60 ml/m LA Vol (A4C): 87.2 ml 54.72 ml/m  AORTIC VALVE AV Area (Vmax):    2.03 cm AV Area (Vmean):   1.95 cm  AV Area (VTI):     2.00 cm AV Vmax:           148.00 cm/s AV Vmean:          90.600 cm/s AV VTI:            0.238 m AV Peak Grad:      8.8 mmHg AV Mean Grad:      4.0 mmHg LVOT Vmax:         106.00 cm/s LVOT Vmean:        62.300 cm/s LVOT VTI:          0.168 m LVOT/AV VTI ratio: 0.71  AORTA Ao Root diam: 2.80 cm Ao Asc diam:  3.10 cm TRICUSPID VALVE TR Peak grad:   41.7 mmHg TR Vmax:        323.00 cm/s  SHUNTS Systemic VTI:  0.17 m Systemic Diam: 1.90 cm Oswaldo Milian MD Electronically signed by Oswaldo Milian MD Signature Date/Time: 02/09/2021/5:26:21 PM    Final      Labs:   Basic Metabolic Panel: Recent Labs  Lab 02/11/21 0350 02/12/21 0505 02/13/21 0413 02/14/21 0256 02/15/21 0244  NA 139 138 140 138 138  K 3.6 4.0 4.1 4.0 4.4  CL 101 100 100 102 101  CO2 27 27 29 28 27   GLUCOSE 87 77 88 91 82  BUN 20 15 22 15 19   CREATININE 3.02* 2.45* 2.94* 2.10* 2.65*  CALCIUM 8.3* 8.4* 8.8* 8.1* 8.4*   GFR Estimated Creatinine Clearance: 13.9 mL/min (A) (by C-G formula based on SCr of 2.65 mg/dL (H)). Liver Function Tests: Recent Labs  Lab 02/08/21 1754 02/09/21 0456  AST 31 18  ALT 18 15  ALKPHOS 66 54  BILITOT 0.7 0.3  PROT 5.3* 4.6*  ALBUMIN 2.0* 1.6*   No results for input(s): LIPASE, AMYLASE in the last 168 hours. No results for input(s): AMMONIA in the last 168 hours. Coagulation profile No results for input(s): INR, PROTIME in the last 168 hours.  CBC: Recent Labs  Lab 02/08/21 1754 02/09/21 0456 02/10/21 0417 02/11/21 0350 02/12/21 0505 02/13/21 0413  02/14/21 0256 02/15/21 0244  WBC 8.9 7.7   < > 8.6 8.7 11.3* 8.8 9.3  NEUTROABS 6.8 4.9  --   --   --   --   --   --   HGB 11.8* 10.6*   < > 8.6* 10.0* 10.5* 9.7* 9.2*  HCT 38.4 32.6*   < > 26.2* 32.1* 32.3* 30.7* 28.9*  MCV 101.6* 100.3*   < > 99.2 100.3* 97.6 98.7 98.6  PLT 123* 121*   < > 120* 124* 143* 140* 145*   < > = values in this interval not displayed.   Cardiac Enzymes: No results for input(s): CKTOTAL, CKMB, CKMBINDEX, TROPONINI in the last 168 hours. BNP: Invalid input(s): POCBNP CBG: Recent Labs  Lab 02/14/21 0655 02/14/21 1249 02/14/21 1759 02/14/21 2108 02/15/21 0637  GLUCAP 72 134* 94 86 86   D-Dimer No results for input(s): DDIMER in the last 72 hours. Hgb A1c No results for input(s): HGBA1C in the last 72 hours. Lipid Profile No results for input(s): CHOL, HDL, LDLCALC, TRIG, CHOLHDL, LDLDIRECT in the last 72 hours. Thyroid function studies No results for input(s): TSH, T4TOTAL, T3FREE, THYROIDAB in the last 72 hours.  Invalid input(s): FREET3 Anemia work up No results for input(s): VITAMINB12, FOLATE, FERRITIN, TIBC, IRON, RETICCTPCT in the last 72 hours. Microbiology Recent Results (from the past 240 hour(s))  SARS CORONAVIRUS 2 (TAT  6-24 HRS) Nasopharyngeal Nasopharyngeal Swab     Status: None   Collection Time: 02/08/21 10:07 PM   Specimen: Nasopharyngeal Swab  Result Value Ref Range Status   SARS Coronavirus 2 NEGATIVE NEGATIVE Final    Comment: (NOTE) SARS-CoV-2 target nucleic acids are NOT DETECTED.  The SARS-CoV-2 RNA is generally detectable in upper and lower respiratory specimens during the acute phase of infection. Negative results do not preclude SARS-CoV-2 infection, do not rule out co-infections with other pathogens, and should not be used as the sole basis for treatment or other patient management decisions. Negative results must be combined with clinical observations, patient history, and epidemiological information. The  expected result is Negative.  Fact Sheet for Patients: SugarRoll.be  Fact Sheet for Healthcare Providers: https://www.woods-mathews.com/  This test is not yet approved or cleared by the [place] FDA and  has been authorized for detection and/or diagnosis of SARS-CoV-2 by FDA under an Emergency Use Authorization (EUA). This EUA will remain  in effect (meaning this test can be used) for the duration of the COVID-19 declaration under Se ction 564(b)(1) of the Act, 21 U.S.C. section 360bbb-3(b)(1), unless the authorization is terminated or revoked sooner.  Performed at [facility], [place] [address] [place], [place] [address]   SARS CORONAVIRUS 2 (TAT 6-24 HRS) Nasopharyngeal Nasopharyngeal Swab     Status: None   Collection Time: 02/14/21 12:10 PM   Specimen: Nasopharyngeal Swab  Result Value Ref Range Status   SARS Coronavirus 2 NEGATIVE NEGATIVE Final    Comment: (NOTE) SARS-CoV-2 target nucleic acids are NOT DETECTED.  The SARS-CoV-2 RNA is generally detectable in upper and lower respiratory specimens during the acute phase of infection. Negative results do not preclude SARS-CoV-2 infection, do not rule out co-infections with other pathogens, and should not be used as the sole basis for treatment or other patient management decisions. Negative results must be combined with clinical observations, patient history, and epidemiological information. The expected result is Negative.  Fact Sheet for Patients: SugarRoll.be  Fact Sheet for Healthcare Providers: https://www.woods-mathews.com/  This test is not yet approved or cleared by the [place] FDA and  has been authorized for detection and/or diagnosis of SARS-CoV-2 by FDA under an Emergency Use Authorization (EUA). This EUA will remain  in effect (meaning this test can be used) for the duration of the COVID-19 declaration under  Se ction 564(b)(1) of the Act, 21 U.S.C. section 360bbb-3(b)(1), unless the authorization is terminated or revoked sooner.  Performed at [facility], [place] [address] [place], [place] [address]      Signed: Terrilee Croak  [facility] 02/15/2021, 10:39 AM

## 2021-02-14 NOTE — Progress Notes (Signed)
PROGRESS NOTE  [name] James  DOB: 04-24-1936  PCP: Patrick Jupiter YWV:[id number]  DOA: 02/08/2021  LOS: 6 days   Chief Complaint  Patient presents with   Weakness    Brief narrative: [name] James is a 85 y.o. female with PMH significant for ESRD on HD MWF, diastolic CHF, anemia of chronic disease, chronic diarrhea who is a resident of [facility] and rehab. Patient has had chronic diarrhea with adjustments made to her treatment by her gastroenterologist, Dr. Hilarie Fredrickson.  As result of her persistent diarrhea patient has had to cut short her dialysis treatments.  This has occurred on numerous occasions.   On 02/08/2021, patient presented mainly with the generalized weakness due to persistent diarrhea, having missed dialysis and worsening edema in the lower extremities.   She was noted to be fluid overloaded.  She was hospitalized for further management.  Subjective: Patient was seen and examined this morning together with nephrologist Dr. [name].  Lying down in bed.  Not in distress. Later this morning, patient was seen by physical therapist.  Patient was able to sit up in the chair.  Assessment/Plan: Volume overload ESRD-HD-MWF -Presented with a state of volume overload based on significant lower extremity edema, bilateral pleural effusion.  -Seen by nephrology.  Reinitiated in dialysis.  Drop in blood pressure has been a challenge during dialysis.  However while in the hospital, patient blood pressure has stayed in the range during dialysis.  Continue midodrine at current dose.  Chronic diarrhea Followed by Dr. Hilarie Fredrickson with gastroenterology.  Looks like she was recently started on colestipol.  Also on Lomotil and Imodium.   Not having significant diarrhea here in the hospital.  Abdomen remains benign.    Chronic nausea and vomiting -Antiemetics as needed.  Anemia of chronic kidney disease -Hemoglobin stable at this time.  No active bleeding. Recent Labs    08/05/20 1428  08/06/20 0802 02/10/21 0417 02/11/21 0350 02/12/21 0505 02/13/21 0413 02/14/21 0256  HGB  --    < > 9.6* 8.6* 10.0* 10.5* 9.7*  MCV  --    < > 99.7 99.2 100.3* 97.6 98.7  VITAMINB12 964*  --   --   --   --   --   --   FOLATE 67.5  --   --   --   --   --   --    < > = values in this interval not displayed.   Diabetes mellitus type 2 Hypoglycemia -A1c 4.6 on 02/08/2021 -Currently on sliding scale insulin with Accu-Cheks. -Because of poor oral intake, blood sugar is running on lower side of normal.  Continue to encourage oral intake.  Continue nutritional supplements. Recent Labs  Lab 02/13/21 0753 02/13/21 1226 02/13/21 1753 02/13/21 2100 02/14/21 0655  GLUCAP 87 120* 75 102* 72   Pleural effusions -Chest x-ray on admission showed moderate bilateral pleural effusion, related to volume overload.  Did not have thoracentesis done. -Currently on 2 L oxygen by nasal cannula.   H/o atrial fibrillation H/o sick sinus syndrome -Admitted and continued.   -Not on anticoagulation because of history of GI bleeding. -Not a candidate for pacemaker per cardiology note. -Echocardiogram from 02/09/2021 showed EF of 70 to 75%, moderate LVH.  History of gout Continue allopurinol.  History of generalized anxiety and insomnia Holding sedative agents.  History of cognitive impairment Chronic microvascular disease noted on CT brain.  Seems to be somewhat distracted but does not have any focal deficits.  Glaucoma Continue eyedrops.  History of pressure injuries Preventive care. Pressure Injury 08/03/20 Sacrum Stage 2 -  Partial thickness loss of dermis presenting as a shallow open injury with a red, pink wound bed without slough. (Active)  08/03/20 2230  Location: Sacrum  Location Orientation:   Staging: Stage 2 -  Partial thickness loss of dermis presenting as a shallow open injury with a red, pink wound bed without slough.  Wound Description (Comments):   Present on Admission:  Yes   Mobility: Limited mobility.  Nursing home resident Code Status:   Code Status: Full Code  Nutritional status: Body mass index is 21.61 kg/m.     Diet Order            Diet renal with fluid restriction Fluid restriction: 1200 mL Fluid; Room service appropriate? Yes; Fluid consistency: Thin  Diet effective now                 DVT prophylaxis: heparin injection 5,000 Units Start: 02/08/21 2230   Antimicrobials:  None Fluid: Currently not on IV fluid Consultants: Nephrology Family Communication:  Discussed with patient's daughter at bedside.  Status is: Inpatient  Dispo:  Patient From: [facility]  Planned Disposition: [place]  Expected discharge date: 02/15/2021  Medically stable for discharge: No         Infusions:   sodium chloride     sodium chloride     sodium chloride     sodium chloride     sodium chloride      Scheduled Meds:  (feeding supplement) PROSource Plus  30 mL Oral BID BM   allopurinol  100 mg Oral Daily   amiodarone  200 mg Oral Daily   brimonidine  1 drop Both Eyes BID   brinzolamide  1 drop Both Eyes BID   calcium carbonate  1 tablet Oral BID   Chlorhexidine Gluconate Cloth  6 each Topical Q0600   colestipol  1 g Oral Daily   diclofenac Sodium  2 g Topical QHS   dimethicone   Topical BID   feeding supplement (NEPRO CARB STEADY)  237 mL Oral TID BM   fluticasone  2 spray Each Nare Q12H   heparin  5,000 Units Subcutaneous Q8H   insulin aspart  0-6 Units Subcutaneous TID WC   loratadine  10 mg Oral Daily   melatonin  10 mg Oral QHS   midodrine  10 mg Oral Q M,W,F-HD   midodrine  5 mg Oral TID WC   pantoprazole  40 mg Oral BID   sodium chloride flush  3 mL Intravenous Q12H   traZODone  50 mg Oral QHS    Antimicrobials: Anti-infectives (From admission, onward)   None      PRN meds: sodium chloride, sodium chloride, sodium chloride, sodium chloride, sodium chloride,  acetaminophen **OR** acetaminophen, alteplase, calcium carbonate (dosed in mg elemental calcium), camphor-menthol **AND** [DISCONTINUED] hydrOXYzine, diphenoxylate-atropine, heparin, HYDROcodone-acetaminophen, lidocaine (PF), lidocaine-prilocaine, loperamide, ondansetron **OR** ondansetron (ZOFRAN) IV, pentafluoroprop-tetrafluoroeth, promethazine, sodium chloride flush, sorbitol, traMADol   Objective: Vitals:   02/14/21 0456 02/14/21 1244  BP: (!) 161/53 (!) 150/75  Pulse: 72 77  Resp: 16 16  Temp: 98.2 F (36.8 C) 98.3 F (36.8 C)  SpO2: 100% 100%    Intake/Output Summary (Last 24 hours) at 02/14/2021 1537 Last data filed at 02/14/2021 1300 Gross per 24 hour  Intake 270 ml  Output 718 ml  Net -448 ml   Filed Weights   02/11/21 1105 02/13/21 1320 02/13/21 2100  Weight:  56 kg 57.1 kg 57.1 kg   Weight change:  Body mass index is 21.61 kg/m.   Physical Exam: General exam: Elderly Caucasian female.  Not in distress at the time of my evaluation this morning Skin: No rashes, lesions or ulcers. HEENT: Atraumatic, normocephalic, no obvious bleeding Lungs: Clear to auscultation bilaterally CVS: Controlled rate and rhythm, no murmur GI/Abd soft, nontender, nondistended, bowel sounds CNS: Alert, awake, oriented to place and person Psychiatry: Depressed look Extremities: Trace to 1+ bilateral pedal edema  Data Review: I have personally reviewed the laboratory data and studies available.  Recent Labs  Lab 02/08/21 1754 02/09/21 0456 02/10/21 0417 02/11/21 0350 02/12/21 0505 02/13/21 0413 02/14/21 0256  WBC 8.9 7.7 8.3 8.6 8.7 11.3* 8.8  NEUTROABS 6.8 4.9  --   --   --   --   --   HGB 11.8* 10.6* 9.6* 8.6* 10.0* 10.5* 9.7*  HCT 38.4 32.6* 29.7* 26.2* 32.1* 32.3* 30.7*  MCV 101.6* 100.3* 99.7 99.2 100.3* 97.6 98.7  PLT 123* 121* 118* 120* 124* 143* 140*   Recent Labs  Lab 02/10/21 0417 02/11/21 0350 02/12/21 0505 02/13/21 0413 02/14/21 0256  NA 138 139 138 140 138  K  3.4* 3.6 4.0 4.1 4.0  CL 98 101 100 100 102  CO2 27 27 27 29 28   GLUCOSE 82 87 77 88 91  BUN 13 20 15 22 15   CREATININE 2.52* 3.02* 2.45* 2.94* 2.10*  CALCIUM 7.8* 8.3* 8.4* 8.8* 8.1*    F/u labs ordered Unresulted Labs (From admission, onward)          Start     Ordered   02/14/21 1210  SARS CORONAVIRUS 2 (TAT 6-24 HRS) Nasopharyngeal Nasopharyngeal Swab  Once,   R       Question Answer Comment  Is this test for diagnosis or screening Screening   Symptomatic for COVID-19 as defined by CDC No   Hospitalized for COVID-19 No   Admitted to ICU for COVID-19 No   Previously tested for COVID-19 Yes   Resident in a congregate (group) care setting Yes   Employed in healthcare setting No   Pregnant No   Has patient completed COVID vaccination(s) (2 doses of Pfizer/Moderna 1 dose of [employer]) Yes   Has patient completed COVID Booster / 3rd dose Unknown      02/14/21 1210   02/11/21 0500  CBC  Daily,   R      02/10/21 0938   02/11/21 8280  Basic metabolic panel  Daily,   R      02/10/21 0938   02/08/21 2228  Urine culture  Add-on,   AD        02/08/21 2229          Signed, Terrilee Croak, MD [facility] 02/14/2021

## 2021-02-14 NOTE — Progress Notes (Signed)
    We were asked to help with the management of [name] James's BP She is on hemodialysis and have multiple issues that interfere with her HD sessions  She has labile bp and requires midodrine daily   I have discussed with Dr. [name].  I think that having nephrology continue to manage her BP is the best course of action. They have to manage the BP so that she is able to have dialysis sessions.  We will defer BP management to the nephrology team .     [name] Moores, MD  02/14/2021 3:23 PM    [place],  [place], [place]  [address] Phone: [phone]; Fax: [phone]

## 2021-02-14 NOTE — TOC Progression Note (Signed)
Transition of Care [facility]) - Progression Note    Patient Details  Name: MILI PILTZ MRN: [id number] Date of Birth: Oct 11, 1936  [facility] CM/SW Contact  Sharlet Salina Mila Homer, LCSW Phone Number: 02/14/2021, 3:34 PM  Clinical Narrative:  Initial plan was to discharge patient today and [facility] advised this morning. CSW informed by MD later today (2:36 pm) that patient will not discharge today. Communicated with daughter Coralyn Mark concerning the discharge beng cancelled and she was aware as she had talked with MD regarding her concerns about her mother's readiness for discharge. Admissions director at [facility] contacted and updated regarding no discharge today. CSW will continue to follow, provide SW interventions services as needed and facilitate discharge back to [facility] when ready.     Expected Discharge Plan: [place] Barriers to Discharge: Continued Medical Work up  Expected Discharge Plan and Services Expected Discharge Plan: [place] In-house Referral: Clinical Social Work     Living arrangements for the past 2 months: [place]                                     Social Determinants of Health (SDOH) Interventions  No SDOH interventions requested or needed at this time.  Readmission Risk Interventions No flowsheet data found.

## 2021-02-14 NOTE — Evaluation (Signed)
Physical Therapy Evaluation Patient Details Name: SIARA GORDER MRN: [id number] DOB: 08-03-1936 Today's Date: 02/14/2021   History of Present Illness  85yo female brought to the ED from her SNF due to generalized weakness, missed HD. Admitted for w/u. PMH knee OA, breast CA, CHF, A-fib, ESRD, gout, HLD, HTN, IBS, peripheral neuropathy, DM, vertigo, back surgery, multiple angioplasties  Clinical Impression   Patient received in bed, reports she is totally bedbound at baseline and is reliant on hoyer lift for transfers. Able to roll side to side with ModA, however continue to require totalAx2 in maximove to transfer to recliner. VSS on 2LPM O2. Left up in recliner positioned to comfort with all needs met, nursing staff aware of 4 hour chair tolerance goal to help determine if she will be able to tolerate HD moving forward. PT signing off for now as she is at her baseline. Thank you for the opportunity to participate in her care!     Follow Up Recommendations Supervision/Assistance - 24 hour;SNF    Equipment Recommendations  Hospital bed;Wheelchair (measurements PT);Wheelchair cushion (measurements PT);Other (comment) (hoyer lift)    Recommendations for Other Services       Precautions / Restrictions Precautions Precautions: Fall;Other (comment) Precaution Comments: heel wounds Restrictions Weight Bearing Restrictions: No      Mobility  Bed Mobility Overal bed mobility: Needs Assistance Bed Mobility: Rolling Rolling: Mod assist         General bed mobility comments: modA and use of railing to roll from side to side    Transfers Overall transfer level: Needs assistance   Transfers:  (bed to chair)           General transfer comment: totalAx2 for maximove lift from bed to chair (at baseline)  Ambulation/Gait             General Gait Details: unable  Stairs            Wheelchair Mobility    Modified Rankin (Stroke Patients Only)       Balance                                              Pertinent Vitals/Pain Pain Assessment: No/denies pain    Home Living Family/patient expects to be discharged to:: Skilled nursing facility                 Additional Comments: still at SNF, reports she is bedbound and only able to get out of bed with the use of a lift at baseline. Was getting some therapy at the SNF but it has been awhile since her report    Prior Function Level of Independence: Needs assistance   Gait / Transfers Assistance Needed: transfers via hoyer lift and spends most of her time in bed except for HD visits  ADL's / Homemaking Assistance Needed: pt is able to feed herself but requires assist for all other ADLs        Hand Dominance   Dominant Hand: Right    Extremity/Trunk Assessment   Upper Extremity Assessment Upper Extremity Assessment: Generalized weakness    Lower Extremity Assessment Lower Extremity Assessment: Generalized weakness    Cervical / Trunk Assessment Cervical / Trunk Assessment: Kyphotic  Communication   Communication: No difficulties  Cognition Arousal/Alertness: Awake/alert Behavior During Therapy: WFL for tasks assessed/performed Overall Cognitive Status: Within Functional Limits for tasks assessed  General Comments General comments (skin integrity, edema, etc.): bedbound at baseline- would assume sitting/standing balance zero    Exercises     Assessment/Plan    PT Assessment All further PT needs can be met in the next venue of care  PT Problem List Decreased strength;Decreased activity tolerance;Decreased mobility       PT Treatment Interventions      PT Goals (Current goals can be found in the Care Plan section)  Acute Rehab PT Goals Patient Stated Goal: go back to SNF PT Goal Formulation: With patient Time For Goal Achievement: 02/28/21 Potential to Achieve Goals: Good    Frequency      Barriers to discharge        Co-evaluation               AM-PAC PT "6 Clicks" Mobility  Outcome Measure Help needed turning from your back to your side while in a flat bed without using bedrails?: A Lot Help needed moving from lying on your back to sitting on the side of a flat bed without using bedrails?: Total Help needed moving to and from a bed to a chair (including a wheelchair)?: Total Help needed standing up from a chair using your arms (e.g., wheelchair or bedside chair)?: Total Help needed to walk in hospital room?: Total Help needed climbing 3-5 steps with a railing? : Total 6 Click Score: 7    End of Session   Activity Tolerance: Patient tolerated treatment well Patient left: in chair;with call bell/phone within reach Nurse Communication: Mobility status;Need for lift equipment PT Visit Diagnosis: Muscle weakness (generalized) (M62.81);Other abnormalities of gait and mobility (R26.89)    Time: 0630-1601 PT Time Calculation (min) (ACUTE ONLY): 47 min   Charges:   PT Evaluation $PT Eval Moderate Complexity: 1 Mod PT Treatments $Therapeutic Activity: 23-37 mins        Windell Norfolk, DPT, PN1   Supplemental Physical Therapist [place]    Pager [phone] Acute Rehab Office [phone]

## 2021-02-14 NOTE — Plan of Care (Signed)
  Problem: Fluid Volume: Goal: Compliance with measures to maintain balanced fluid volume will improve Outcome: Progressing   Problem: Nutritional: Goal: Ability to make healthy dietary choices will improve Outcome: Progressing   Problem: Clinical Measurements: Goal: Complications related to the disease process, condition or treatment will be avoided or minimized Outcome: Progressing

## 2021-02-15 DIAGNOSIS — R531 Weakness: Secondary | ICD-10-CM | POA: Diagnosis not present

## 2021-02-15 LAB — BASIC METABOLIC PANEL
Anion gap: 10 (ref 5–15)
BUN: 19 mg/dL (ref 8–23)
CO2: 27 mmol/L (ref 22–32)
Calcium: 8.4 mg/dL — ABNORMAL LOW (ref 8.9–10.3)
Chloride: 101 mmol/L (ref 98–111)
Creatinine, Ser: 2.65 mg/dL — ABNORMAL HIGH (ref 0.44–1.00)
GFR, Estimated: 17 mL/min — ABNORMAL LOW (ref >60)
Glucose, Bld: 82 mg/dL (ref 70–99)
Potassium: 4.4 mmol/L (ref 3.5–5.1)
Sodium: 138 mmol/L (ref 135–145)

## 2021-02-15 LAB — CBC
HCT: 28.9 % — ABNORMAL LOW (ref 36.0–46.0)
Hemoglobin: 9.2 g/dL — ABNORMAL LOW (ref 12.0–15.0)
MCH: 31.4 pg (ref 26.0–34.0)
MCHC: 31.8 g/dL (ref 30.0–36.0)
MCV: 98.6 fL (ref 80.0–100.0)
Platelets: 145 10*3/uL — ABNORMAL LOW (ref 150–400)
RBC: 2.93 MIL/uL — ABNORMAL LOW (ref 3.87–5.11)
RDW: 17.2 % — ABNORMAL HIGH (ref 11.5–15.5)
WBC: 9.3 10*3/uL (ref 4.0–10.5)
nRBC: 0 % (ref 0.0–0.2)

## 2021-02-15 LAB — GLUCOSE, CAPILLARY
Glucose-Capillary: 86 mg/dL (ref 70–99)
Glucose-Capillary: 129 mg/dL — ABNORMAL HIGH (ref 70–99)
Glucose-Capillary: 149 mg/dL — ABNORMAL HIGH (ref 70–99)
Glucose-Capillary: 104 mg/dL — ABNORMAL HIGH (ref 70–99)

## 2021-02-15 MED ORDER — HEPARIN SODIUM (PORCINE) 1000 UNIT/ML IJ SOLN
INTRAMUSCULAR | Status: AC
  Administered 2021-02-15: 1000 [IU]
  Filled 2021-02-15: qty 2

## 2021-02-15 MED ORDER — PROSOURCE PLUS PO LIQD: 30.0000 mL | Freq: Two times a day (BID) | ORAL | Status: AC

## 2021-02-15 MED ORDER — NEPRO/CARBSTEADY PO LIQD: 237.0000 mL | Freq: Three times a day (TID) | ORAL | 0 refills | Status: AC

## 2021-02-15 MED ORDER — HEPARIN SODIUM (PORCINE) 1000 UNIT/ML IJ SOLN
INTRAMUSCULAR | Status: AC
  Filled 2021-02-15: qty 2

## 2021-02-15 MED ORDER — AMIODARONE HCL 200 MG PO TABS: 200.0000 mg | ORAL_TABLET | Freq: Every day | ORAL | Status: AC

## 2021-02-15 MED ORDER — MIDODRINE HCL 10 MG PO TABS: 10.0000 mg | ORAL_TABLET | ORAL | Status: AC

## 2021-02-15 MED ORDER — MIDODRINE HCL 2.5 MG PO TABS: 2.5000 mg | ORAL_TABLET | Freq: Three times a day (TID) | ORAL | Status: AC

## 2021-02-15 NOTE — TOC Transition Note (Addendum)
Transition of Care (TOC) - CM/SW Discharge Note *Discharged back to [facility] *Room 205A *Number for Report: [phone]  Patient Details  Name: [name] James MRN: [id number] Date of Birth: 19-Jun-1936  Transition of Care [facility]) CM/SW Contact:  [name] Feil, LCSW Phone Number: 02/15/2021, 12:11 PM   Clinical Narrative:  Patient medically ready for discharge and will return to [facility], where she is a long-term resident. Daughter informed of discharge and ambulance transport. Call made to [facility] and informed Star, admissions director that patient ready for discharge today. Discharge clinicals transmitted to facility.  3:26 pm: [facility] called and transport arranged. 3:28 pm: [employer], admissions Mudlogger at [facility] and left message informing her that [facility] backed up and patient's pick-up will be very late. 3:34 pm: TC daughter [name] James [phone]) and informed her that transport arranged and late pick-up. [name] expressed understanding.     Final next level of care: Skilled Nursing Facility Barriers to Discharge: Barriers Resolved   Patient Goals and CMS Choice Patient states their goals for this hospitalization and ongoing recovery are:: Daughter indicated that patient will return to [facility] for continued nursing carre CMS Medicare.gov Compare Post Acute Care list provided to:: Other (Comment Required) (Not needed as patient returning to [facility]) Choice offered to / list presented to : NA  Discharge Placement   Existing PASRR number confirmed : 02/14/21          Patient chooses bed at: [facility] Patient to be transferred to facility by: [facility]emerg[facility] ambulance transport Name of family member notified: Daughter [name] James - [phone] Patient and family notified of of transfer: 02/15/21  Discharge Plan and Services In-house Referral: Clinical Social Work                                  Social Determinants of Health ([place])  Interventions  No SDOH interventions requested or needed at discharge   Readmission Risk Interventions No flowsheet data found.

## 2021-02-15 NOTE — Care Management Important Message (Signed)
Important Message  Patient Details  Name: [name] James MRN: [id number] Date of Birth: Dec 06, 1936   Medicare Important Message Given:  Yes     Megan P Shular 02/15/2021, 2:44 PM

## 2021-02-15 NOTE — Progress Notes (Signed)
Spoke to [place] at [facility] to give report.

## 2021-02-15 NOTE — Plan of Care (Signed)
  Problem: Nutritional: Goal: Ability to make healthy dietary choices will improve Outcome: Not Progressing   

## 2021-02-15 NOTE — Progress Notes (Signed)
Patient discharged to [facility] transported by [facility]. Belongings sent with patient.

## 2021-02-15 NOTE — Progress Notes (Signed)
[facility] Progress Note   Subjective:     [name] James seen and examined today at bedside. She reports feeling tired with occasional episodes of diarrhea. Denies SOB, CP, nausea, and vomiting at this time. If tolerates HD in chair, plan is for possible discharge back to [facility] today.  Objective Vitals:   02/15/21 1200 02/15/21 1230 02/15/21 1300 02/15/21 1312  BP: 122/60 127/84 (!) 76/60 (!) 156/61  Pulse: 95 95 95 96  Resp: (!) 21 (!) 22 (!) 24 (!) 21  Temp:      TempSrc:      SpO2: 100%  100% 100%  Weight:       Physical Exam General: Frail elderly female; No acute respiratory distress Heart: S1 and S2; No murmurs, gallops, friction rub Lungs: Noted fine crackles L mid lobe Abdomen: Soft, round, non-tender Extremities: +1 edema bilateral hips lower extremities Dialysis Access: L chest [facility] Filed Weights   02/13/21 2100 02/14/21 2100 02/14/21 2120  Weight: 57.1 kg 59.1 kg 59.1 kg    Intake/Output Summary (Last 24 hours) at 02/15/2021 1325 Last data filed at 02/15/2021 0555 Gross per 24 hour  Intake 180 ml  Output 0 ml  Net 180 ml    Additional Objective Labs: Basic Metabolic Panel: Recent Labs  Lab 02/13/21 0413 02/14/21 0256 02/15/21 0244  NA 140 138 138  K 4.1 4.0 4.4  CL 100 102 101  CO2 29 28 27   GLUCOSE 88 91 82  BUN 22 15 19   CREATININE 2.94* 2.10* 2.65*  CALCIUM 8.8* 8.1* 8.4*   Liver Function Tests: Recent Labs  Lab 02/08/21 1754 02/09/21 0456  AST 31 18  ALT 18 15  ALKPHOS 66 54  BILITOT 0.7 0.3  PROT 5.3* 4.6*  ALBUMIN 2.0* 1.6*   No results for input(s): LIPASE, AMYLASE in the last 168 hours. CBC: Recent Labs  Lab 02/08/21 1754 02/09/21 0456 02/10/21 0417 02/11/21 0350 02/12/21 0505 02/13/21 0413 02/14/21 0256 02/15/21 0244  WBC 8.9 7.7   < > 8.6 8.7 11.3* 8.8 9.3  NEUTROABS 6.8 4.9  --   --   --   --   --   --   HGB 11.8* 10.6*   < > 8.6* 10.0* 10.5* 9.7* 9.2*  HCT 38.4 32.6*   < > 26.2* 32.1* 32.3*  30.7* 28.9*  MCV 101.6* 100.3*   < > 99.2 100.3* 97.6 98.7 98.6  PLT 123* 121*   < > 120* 124* 143* 140* 145*   < > = values in this interval not displayed.   Blood Culture    Component Value Date/Time   SDES BLOOD LEFT ARM 10/30/2020 1525   SPECREQUEST  10/30/2020 1525    BOTTLES DRAWN AEROBIC ONLY Blood Culture results may not be optimal due to an inadequate volume of blood received in culture bottles   CULT  10/30/2020 1525    NO GROWTH 5 DAYS Performed at [facility], [place] [address] [place], [place] [address]    REPTSTATUS 11/04/2020 FINAL 10/30/2020 1525    Cardiac Enzymes: No results for input(s): CKTOTAL, CKMB, CKMBINDEX, TROPONINI in the last 168 hours. CBG: Recent Labs  Lab 02/14/21 0655 02/14/21 1249 02/14/21 1759 02/14/21 2108 02/15/21 0637  GLUCAP 72 134* 94 86 86   Iron Studies: No results for input(s): IRON, TIBC, TRANSFERRIN, FERRITIN in the last 72 hours. Lab Results  Component Value Date   INR 1.22 08/25/2018   INR 1.03 07/05/2018   INR 1.17 01/19/2017  Studies/Results: No results found.  Medications:  sodium chloride     sodium chloride     sodium chloride     sodium chloride     sodium chloride      (feeding supplement) PROSource Plus  30 mL Oral BID BM   allopurinol  100 mg Oral Daily   amiodarone  200 mg Oral Daily   brimonidine  1 drop Both Eyes BID   brinzolamide  1 drop Both Eyes BID   calcium carbonate  1 tablet Oral BID   Chlorhexidine Gluconate Cloth  6 each Topical Q0600   colestipol  1 g Oral Daily   diclofenac Sodium  2 g Topical QHS   dimethicone   Topical BID   feeding supplement (NEPRO CARB STEADY)  237 mL Oral TID BM   fluticasone  2 spray Each Nare Q12H   heparin  5,000 Units Subcutaneous Q8H   heparin sodium (porcine)       heparin sodium (porcine)       insulin aspart  0-6 Units Subcutaneous TID WC   loratadine  10 mg Oral Daily   melatonin  10 mg Oral QHS   midodrine  10 mg Oral Q  M,W,F-HD   midodrine  2.5 mg Oral TID WC   pantoprazole  40 mg Oral BID   sodium chloride flush  3 mL Intravenous Q12H   traZODone  50 mg Oral QHS    Dialysis Orders:  AF MWF 3h 83min 400/500 EDW 53kg 3K/2Ca  TDC Heparin 3300 bolus No ESA/VDRA  Assessment/Plan: 1. Generalized weakness: No acute findings on admission. Per primary  2. Diarrhea/rectal pain: Chronic. Limits her ability to tolerate dialysis treatments. Followed by GI as outpatient.  3. ESRD -HD MWF. Truncated treatments last week d/t #2. HD 2/19 with no UF. HDcompleted 02/13/21 with net UF 740ml. Patient is for possible discharge today if patient today's HD in chair. Currently receiving HD. 4. HypOtension/volume - Having difficulty removing fluid d/t low BP trends during HD treatments. Noted BP trend sys 130-160s. Continues to have edema on exam, No UF2/19,2L UF with HD2/21, and UF 745ml with HD on 02/13/21. CXR on admit with b/l pleural effusions. Plan is to continue Midodrine course 10mg  with HD (Mon, Wed, Fri) and 2.5mg  TID (hold for SBP >120).  5. Anemia - Hgb stable, 9.2. Not on ESA. Follow trends.  6. Metabolic bone disease -Corr Ca ok. No VDRA/binders as OP. Follow trends  7. AFib - on amiodarone 8. Nutrition -Very low albumin. Add prot supp.  9. Debility- Very frail appearing. May be approaching point where she can no longer tolerate dialysis. Palliative care consult on 2/19-wants to remain full scope of care for now. 10. DMT2 - per primary. 11. Discharge Planning: plan for d/c back to [facility] today. If patient can tolerate HD in chair, Okay for her to be discharged.   Tobie Poet, NP [facility] 02/15/2021,1:25 PM  LOS: 7 days

## 2021-03-12 ENCOUNTER — Ambulatory Visit: Payer: Medicare Other | Admitting: Cardiology

## 2021-03-12 DIAGNOSIS — I495 Sick sinus syndrome: Secondary | ICD-10-CM

## 2021-03-22 DEATH — deceased
